# Patient Record
Sex: Female | Born: 1945 | Race: White | Hispanic: No | Marital: Married | Smoking: Former smoker
Health system: Southern US, Community
[De-identification: ages and names within clinical notes are randomized; demographics above are authoritative.]

## PROBLEM LIST (undated history)

## (undated) DIAGNOSIS — F32A Depression, unspecified: Secondary | ICD-10-CM

## (undated) DIAGNOSIS — G63 Polyneuropathy in diseases classified elsewhere: Secondary | ICD-10-CM

## (undated) DIAGNOSIS — F329 Major depressive disorder, single episode, unspecified: Secondary | ICD-10-CM

## (undated) DIAGNOSIS — E109 Type 1 diabetes mellitus without complications: Secondary | ICD-10-CM

## (undated) DIAGNOSIS — C801 Malignant (primary) neoplasm, unspecified: Secondary | ICD-10-CM

## (undated) DIAGNOSIS — J189 Pneumonia, unspecified organism: Secondary | ICD-10-CM

## (undated) DIAGNOSIS — I509 Heart failure, unspecified: Secondary | ICD-10-CM

## (undated) DIAGNOSIS — C569 Malignant neoplasm of unspecified ovary: Secondary | ICD-10-CM

## (undated) DIAGNOSIS — G629 Polyneuropathy, unspecified: Secondary | ICD-10-CM

## (undated) DIAGNOSIS — M359 Systemic involvement of connective tissue, unspecified: Secondary | ICD-10-CM

## (undated) DIAGNOSIS — K219 Gastro-esophageal reflux disease without esophagitis: Secondary | ICD-10-CM

## (undated) DIAGNOSIS — I2510 Atherosclerotic heart disease of native coronary artery without angina pectoris: Secondary | ICD-10-CM

## (undated) DIAGNOSIS — N135 Crossing vessel and stricture of ureter without hydronephrosis: Secondary | ICD-10-CM

## (undated) DIAGNOSIS — E785 Hyperlipidemia, unspecified: Secondary | ICD-10-CM

## (undated) DIAGNOSIS — Z8719 Personal history of other diseases of the digestive system: Secondary | ICD-10-CM

## (undated) DIAGNOSIS — R0602 Shortness of breath: Secondary | ICD-10-CM

## (undated) DIAGNOSIS — IMO0002 Reserved for concepts with insufficient information to code with codable children: Secondary | ICD-10-CM

## (undated) DIAGNOSIS — I10 Essential (primary) hypertension: Secondary | ICD-10-CM

## (undated) DIAGNOSIS — J3089 Other allergic rhinitis: Secondary | ICD-10-CM

## (undated) DIAGNOSIS — K222 Esophageal obstruction: Secondary | ICD-10-CM

## (undated) DIAGNOSIS — I214 Non-ST elevation (NSTEMI) myocardial infarction: Secondary | ICD-10-CM

## (undated) DIAGNOSIS — E876 Hypokalemia: Secondary | ICD-10-CM

## (undated) DIAGNOSIS — I639 Cerebral infarction, unspecified: Secondary | ICD-10-CM

## (undated) DIAGNOSIS — M1990 Unspecified osteoarthritis, unspecified site: Secondary | ICD-10-CM

## (undated) DIAGNOSIS — M069 Rheumatoid arthritis, unspecified: Secondary | ICD-10-CM

## (undated) DIAGNOSIS — D649 Anemia, unspecified: Secondary | ICD-10-CM

## (undated) DIAGNOSIS — D72819 Decreased white blood cell count, unspecified: Secondary | ICD-10-CM

## (undated) DIAGNOSIS — Z862 Personal history of diseases of the blood and blood-forming organs and certain disorders involving the immune mechanism: Secondary | ICD-10-CM

## (undated) HISTORY — PX: OSTOMY: SHX5997

## (undated) HISTORY — PX: OVARY SURGERY: SHX727

## (undated) HISTORY — DX: Esophageal obstruction: K22.2

## (undated) HISTORY — DX: Hypokalemia: E87.6

## (undated) HISTORY — DX: Essential (primary) hypertension: I10

## (undated) HISTORY — DX: Rheumatoid arthritis, unspecified: M06.9

## (undated) HISTORY — PX: EYE SURGERY: SHX253

## (undated) HISTORY — DX: Personal history of diseases of the blood and blood-forming organs and certain disorders involving the immune mechanism: Z86.2

## (undated) HISTORY — PX: ESOPHAGEAL DILATION: SHX303

## (undated) HISTORY — DX: Decreased white blood cell count, unspecified: D72.819

## (undated) HISTORY — DX: Reserved for concepts with insufficient information to code with codable children: IMO0002

## (undated) HISTORY — DX: Anemia, unspecified: D64.9

## (undated) HISTORY — DX: Non-ST elevation (NSTEMI) myocardial infarction: I21.4

## (undated) HISTORY — DX: Hyperlipidemia, unspecified: E78.5

## (undated) HISTORY — DX: Gastro-esophageal reflux disease without esophagitis: K21.9

---

## 1968-05-02 HISTORY — PX: TUBAL LIGATION: SHX77

## 2008-05-02 HISTORY — PX: CATARACT EXTRACTION W/ INTRAOCULAR LENS IMPLANT: SHX1309

## 2009-03-18 ENCOUNTER — Ambulatory Visit: Payer: Self-pay | Admitting: Internal Medicine

## 2009-05-11 ENCOUNTER — Ambulatory Visit: Payer: Self-pay | Admitting: Gastroenterology

## 2010-04-06 ENCOUNTER — Ambulatory Visit: Payer: Self-pay | Admitting: Internal Medicine

## 2010-04-09 ENCOUNTER — Ambulatory Visit: Payer: Self-pay | Admitting: Internal Medicine

## 2010-04-09 LAB — HM MAMMOGRAPHY: HM Mammogram: NORMAL

## 2010-04-14 ENCOUNTER — Ambulatory Visit: Payer: Self-pay | Admitting: Internal Medicine

## 2010-05-02 ENCOUNTER — Ambulatory Visit: Payer: Self-pay | Admitting: Internal Medicine

## 2010-05-02 DIAGNOSIS — D72819 Decreased white blood cell count, unspecified: Secondary | ICD-10-CM

## 2010-05-02 HISTORY — PX: ESOPHAGOGASTRODUODENOSCOPY: SHX1529

## 2010-05-02 HISTORY — DX: Decreased white blood cell count, unspecified: D72.819

## 2010-05-12 ENCOUNTER — Ambulatory Visit: Payer: Self-pay | Admitting: Gastroenterology

## 2010-05-15 LAB — PATHOLOGY REPORT

## 2010-06-02 ENCOUNTER — Ambulatory Visit: Payer: Self-pay | Admitting: Internal Medicine

## 2010-06-02 ENCOUNTER — Ambulatory Visit: Payer: Self-pay | Admitting: Rheumatology

## 2010-06-09 ENCOUNTER — Encounter: Payer: Self-pay | Admitting: Internal Medicine

## 2010-07-01 ENCOUNTER — Ambulatory Visit: Payer: Self-pay | Admitting: Internal Medicine

## 2010-07-13 ENCOUNTER — Ambulatory Visit: Payer: Self-pay | Admitting: Internal Medicine

## 2010-08-01 ENCOUNTER — Ambulatory Visit: Payer: Self-pay | Admitting: Internal Medicine

## 2010-09-10 ENCOUNTER — Ambulatory Visit: Payer: Self-pay | Admitting: Internal Medicine

## 2010-10-01 ENCOUNTER — Ambulatory Visit: Payer: Self-pay | Admitting: Internal Medicine

## 2010-11-15 ENCOUNTER — Ambulatory Visit: Payer: Self-pay | Admitting: Internal Medicine

## 2010-12-01 ENCOUNTER — Ambulatory Visit: Payer: Self-pay | Admitting: Internal Medicine

## 2011-01-10 ENCOUNTER — Encounter: Payer: Self-pay | Admitting: Internal Medicine

## 2011-01-10 ENCOUNTER — Ambulatory Visit (INDEPENDENT_AMBULATORY_CARE_PROVIDER_SITE_OTHER): Payer: Medicare Other | Admitting: Internal Medicine

## 2011-01-10 DIAGNOSIS — R197 Diarrhea, unspecified: Secondary | ICD-10-CM

## 2011-01-10 DIAGNOSIS — I10 Essential (primary) hypertension: Secondary | ICD-10-CM

## 2011-01-10 DIAGNOSIS — E119 Type 2 diabetes mellitus without complications: Secondary | ICD-10-CM

## 2011-01-10 DIAGNOSIS — M069 Rheumatoid arthritis, unspecified: Secondary | ICD-10-CM

## 2011-01-10 DIAGNOSIS — E785 Hyperlipidemia, unspecified: Secondary | ICD-10-CM

## 2011-01-10 LAB — COMPREHENSIVE METABOLIC PANEL
ALT: 17 U/L (ref 0–35)
Alkaline Phosphatase: 65 U/L (ref 39–117)
CO2: 30 mEq/L (ref 19–32)
Creatinine, Ser: 0.8 mg/dL (ref 0.4–1.2)
GFR: 76.03 mL/min (ref >60.00)
Glucose, Bld: 422 mg/dL — ABNORMAL HIGH (ref 70–99)
Sodium: 133 mEq/L — ABNORMAL LOW (ref 135–145)
Total Bilirubin: 0.5 mg/dL (ref 0.3–1.2)

## 2011-01-10 LAB — MICROALBUMIN / CREATININE URINE RATIO
Creatinine,U: 50 mg/dL
Microalb Creat Ratio: 0.4 mg/g (ref 0.0–30.0)
Microalb, Ur: 0.2 mg/dL (ref 0.0–1.9)

## 2011-01-10 LAB — HEMOGLOBIN A1C: Hgb A1c MFr Bld: 8.4 % — ABNORMAL HIGH (ref 4.6–6.5)

## 2011-01-10 LAB — GLUCOSE, POCT (MANUAL RESULT ENTRY): POC Glucose: 460

## 2011-01-10 NOTE — Patient Instructions (Signed)
Stool culture today. Return visit in 1 month.

## 2011-01-10 NOTE — Progress Notes (Signed)
Subjective:    Patient ID: [name], female    DOB: 28-May-1945, 65 y.o.   MRN: [id number]  HPI Ms. Rotundo is a 65 year old female with a history of diabetes who presents for followup. In regards to her diabetes she reports generally good control. Last night she had an issue where her insulin pump was inadvertently inserted into a blood vessel resulting in low blood sugars. She ultimately changed sites and this morning her blood sugar is elevated. She has bolused herself for a blood sugar over 400. Outside of this event she reports good control of her blood sugars with low sugars between 1 and 200. She occasionally has low blood sugars less than 60 for which she is symptomatic. She carries sugar with her for these episodes.  Ms. Laughter is concerned today with an approximate 2 month history of watery diarrhea. She has noted this diarrhea ever since starting Remicade for her rheumatoid arthritis. The diarrhea occurs immediately after eating. The stool is mostly consistent of food particles. She denies any blood in her stool. She occasionally has crampy abdominal pain. The diarrhea occurs nearly every day. She denies any weight loss secondary to this. She denies any fever, chills, or known sick contacts. She has not recently been on antibiotics.  Outpatient Encounter Prescriptions as of 01/10/2011  Medication Sig Dispense Refill  . amLODipine (NORVASC) 10 MG tablet Take 10 mg by mouth daily.       Marland Kitchen aspirin 81 MG tablet Take 81 mg by mouth daily.        . Calcium Carbonate Antacid (TUMS PO) Take 4 tablets by mouth daily.        . Dihydroxyaluminum Sod Carb (ROLAIDS PO) Take by mouth as needed.        . folic acid (FOLVITE) 1 MG tablet Take 1 mg by mouth daily.        . furosemide (LASIX) 40 MG tablet Take 40 mg by mouth 2 (two) times daily.       Marland Kitchen HUMALOG 100 UNIT/ML injection       . ibuprofen (ADVIL,MOTRIN) 800 MG tablet Take 800 mg by mouth daily as needed. 2 tablets daily as needed       .  InFLIXimab (REMICADE IV) Inject into the vein every 8 (eight) weeks.        Marland Kitchen losartan (COZAAR) 100 MG tablet Take 100 mg by mouth daily.       . methotrexate (RHEUMATREX) 2.5 MG tablet Take 2.5 mg by mouth once a week. 4 tablets once a week      . metoprolol (LOPRESSOR) 100 MG tablet Take 100 mg by mouth 2 (two) times daily.       . Multiple Vitamin (MULTIVITAMIN) tablet Take 1 tablet by mouth daily.        Marland Kitchen omeprazole (PRILOSEC) 40 MG capsule Take 40 mg by mouth daily.       . predniSONE (DELTASONE) 5 MG tablet Take 5 mg by mouth daily as needed.       . pyridOXINE (VITAMIN B-6) 100 MG tablet Take 100 mg by mouth daily.        . simvastatin (ZOCOR) 40 MG tablet Take 40 mg by mouth at bedtime.         Review of Systems  Constitutional: Negative for fever, chills, appetite change, fatigue and unexpected weight change.  HENT: Negative for ear pain, congestion, sore throat, trouble swallowing, neck pain, voice change and sinus pressure.   Eyes: Negative for visual  disturbance.  Respiratory: Negative for cough, shortness of breath, wheezing and stridor.   Cardiovascular: Negative for chest pain, palpitations and leg swelling.  Gastrointestinal: Positive for abdominal pain (occasional with diarrhea) and diarrhea. Negative for nausea, vomiting, constipation, blood in stool, abdominal distention and anal bleeding.  Genitourinary: Negative for dysuria and flank pain.  Musculoskeletal: Negative for myalgias, arthralgias and gait problem.  Skin: Negative for color change and rash.  Neurological: Negative for dizziness and headaches.  Hematological: Negative for adenopathy. Does not bruise/bleed easily.  Psychiatric/Behavioral: Negative for suicidal ideas, sleep disturbance and dysphoric mood. The patient is not nervous/anxious.    BP 136/74  Pulse 66  Temp(Src) 97.7 F (36.5 C) (Oral)  Resp 14  Ht 5\' 4"  (1.626 m)  Wt 147 lb 12 oz (67.019 kg)  BMI 25.36 kg/m2  SpO2 97%     Objective:    Physical Exam  Constitutional: She is oriented to person, place, and time. She appears well-developed and well-nourished. No distress.  HENT:  Head: Normocephalic and atraumatic.  Right Ear: External ear normal.  Left Ear: External ear normal.  Nose: Nose normal.  Mouth/Throat: Oropharynx is clear and moist. No oropharyngeal exudate.  Eyes: Conjunctivae are normal. Pupils are equal, round, and reactive to light. Right eye exhibits no discharge. Left eye exhibits no discharge. No scleral icterus.  Neck: Normal range of motion. Neck supple. No tracheal deviation present. No thyromegaly present.  Cardiovascular: Normal rate, regular rhythm, normal heart sounds and intact distal pulses.  Exam reveals no gallop and no friction rub.   No murmur heard. Pulmonary/Chest: Effort normal and breath sounds normal. No respiratory distress. She has no wheezes. She has no rales. She exhibits no tenderness.  Abdominal: Soft. Bowel sounds are normal. She exhibits no distension and no mass. There is no tenderness. There is no rebound and no guarding.  Musculoskeletal: Normal range of motion. She exhibits no edema and no tenderness.  Lymphadenopathy:    She has no cervical adenopathy.  Neurological: She is alert and oriented to person, place, and time. No cranial nerve deficit. She exhibits normal muscle tone. Coordination normal.  Skin: Skin is warm and dry. No rash noted. She is not diaphoretic. No erythema. No pallor.  Psychiatric: She has a normal mood and affect. Her behavior is normal. Judgment and thought content normal.          Assessment & Plan:  1. Diabetes -  has previously been under good control. Blood sugar of over 400 today is likely an outlier related to insulin pump becoming disconnected. She has bolused herself insulin and will continue to monitor her blood sugars carefully. She will call if blood sugars persistently greater than 250 or she has recurrent lows less than 60. We will repeat  hemoglobin A1c with labs today. Foot exam was normal today. We need to confirm that she has had her eye exam. She is up-to-date on Pneumovax. She will need a flu shot when available.  2. Diarrhea - diarrhea persistent for over 2 months. May be related to use of Remicade. Will, however, check stool for C. difficile infection and we'll send a stool culture. These results should be available tomorrow. If both are negative will discuss trial off of Remicade with her rheumatologist. Will check electrolytes and renal function today with labs. RTC in 1 month.

## 2011-01-15 LAB — STOOL CULTURE

## 2011-02-08 ENCOUNTER — Ambulatory Visit: Payer: Medicare Other | Admitting: Internal Medicine

## 2011-02-08 LAB — HM PAP SMEAR: HM Pap smear: NORMAL

## 2011-02-09 ENCOUNTER — Encounter: Payer: Self-pay | Admitting: Internal Medicine

## 2011-04-05 ENCOUNTER — Encounter: Payer: Self-pay | Admitting: Internal Medicine

## 2011-04-05 ENCOUNTER — Ambulatory Visit (INDEPENDENT_AMBULATORY_CARE_PROVIDER_SITE_OTHER): Payer: Medicare Other | Admitting: Internal Medicine

## 2011-04-05 DIAGNOSIS — K219 Gastro-esophageal reflux disease without esophagitis: Secondary | ICD-10-CM

## 2011-04-05 DIAGNOSIS — Z0000 Encounter for general adult medical examination without abnormal findings: Secondary | ICD-10-CM

## 2011-04-05 DIAGNOSIS — M810 Age-related osteoporosis without current pathological fracture: Secondary | ICD-10-CM | POA: Insufficient documentation

## 2011-04-05 DIAGNOSIS — E119 Type 2 diabetes mellitus without complications: Secondary | ICD-10-CM

## 2011-04-05 DIAGNOSIS — E785 Hyperlipidemia, unspecified: Secondary | ICD-10-CM

## 2011-04-05 DIAGNOSIS — I10 Essential (primary) hypertension: Secondary | ICD-10-CM

## 2011-04-05 LAB — COMPREHENSIVE METABOLIC PANEL
Alkaline Phosphatase: 74 U/L (ref 39–117)
CO2: 28 mEq/L (ref 19–32)
Creatinine, Ser: 0.8 mg/dL (ref 0.4–1.2)
GFR: 72.81 mL/min (ref >60.00)
Glucose, Bld: 282 mg/dL — ABNORMAL HIGH (ref 70–99)
Total Bilirubin: 0.5 mg/dL (ref 0.3–1.2)

## 2011-04-05 MED ORDER — DEXLANSOPRAZOLE 60 MG PO CPDR: 60.0000 mg | DELAYED_RELEASE_CAPSULE | Freq: Every day | ORAL | Status: DC

## 2011-04-05 NOTE — Progress Notes (Signed)
Subjective:    Patient ID: [name], female    DOB: 04/11/1946, 65 y.o.   MRN: [id number]  HPI 65YO female with Type 1 DM, hypertension, HL, osteoporosis, and GERD presents for follow up.  She reports she is generally feeling well. She has several concerns today.  First, she notes that her GERD symptoms of epigastric pain have been poorly controlled recently. She has not been able to take Omeprazole because it causes severe diarrhea.  She has been using Prevacid, however this does not control her symptoms. She would like to try another medication.  Second, she would like to potentially change cholesterol medications from Simvastatin to Lipitor. We had discussed this in the past because of med interactions with Simvastatin, however cost was prohibitive.  Third, she would like to start back on therapy for osteoporosis. She had severe abdominal pain with Boniva in the past, so would like to know other options.  In regards to her DM, she notes continued brittle sugars with frequent BG<80 and >300.  She manages this by modifying her insulin dosage through insulin pump.  She reports full compliance with all meds.  Did not bring record of BG or BP today.  Outpatient Encounter Prescriptions as of 04/05/2011  Medication Sig Dispense Refill  . amLODipine (NORVASC) 10 MG tablet Take 10 mg by mouth daily.       Marland Kitchen aspirin 81 MG tablet Take 81 mg by mouth daily.        . Calcium Carbonate Antacid (TUMS PO) Take 4 tablets by mouth daily.        . Dihydroxyaluminum Sod Carb (ROLAIDS PO) Take by mouth as needed.        . folic acid (FOLVITE) 1 MG tablet Take 1 mg by mouth daily.        . furosemide (LASIX) 40 MG tablet Take 40 mg by mouth 2 (two) times daily.       Marland Kitchen HUMALOG 100 UNIT/ML injection Insulin pump      . ibuprofen (ADVIL,MOTRIN) 800 MG tablet Take 800 mg by mouth daily as needed. 2 tablets daily as needed       . InFLIXimab (REMICADE IV) Inject into the vein every 8 (eight) weeks.        Marland Kitchen  losartan (COZAAR) 100 MG tablet Take 100 mg by mouth daily.       . methotrexate (RHEUMATREX) 2.5 MG tablet Take 10 mg by mouth once a week.       . metoprolol (LOPRESSOR) 100 MG tablet Take 100 mg by mouth 2 (two) times daily.       . Multiple Vitamin (MULTIVITAMIN) tablet Take 1 tablet by mouth daily.        Marland Kitchen omeprazole (PRILOSEC) 40 MG capsule Take 40 mg by mouth daily.       . predniSONE (DELTASONE) 5 MG tablet Take 5 mg by mouth daily as needed.       . pyridOXINE (VITAMIN B-6) 100 MG tablet Take 100 mg by mouth daily.        . simvastatin (ZOCOR) 40 MG tablet Take 40 mg by mouth at bedtime.       Marland Kitchen dexlansoprazole (DEXILANT) 60 MG capsule Take 1 capsule (60 mg total) by mouth daily.  30 capsule  6    Review of Systems  Constitutional: Negative for fever, chills, appetite change, fatigue and unexpected weight change.  HENT: Negative for ear pain, congestion, sore throat, trouble swallowing, neck pain, voice change and sinus pressure.  Eyes: Negative for visual disturbance.  Respiratory: Negative for cough, shortness of breath, wheezing and stridor.   Cardiovascular: Negative for chest pain, palpitations and leg swelling.  Gastrointestinal: Positive for abdominal pain (epigastric). Negative for nausea, vomiting, diarrhea, constipation, blood in stool, abdominal distention and anal bleeding.  Genitourinary: Negative for dysuria and flank pain.  Musculoskeletal: Negative for myalgias, arthralgias and gait problem.  Skin: Negative for color change and rash.  Neurological: Negative for dizziness and headaches.  Hematological: Negative for adenopathy. Does not bruise/bleed easily.  Psychiatric/Behavioral: Negative for suicidal ideas, sleep disturbance and dysphoric mood. The patient is not nervous/anxious.    BP 150/60  Pulse 67  Temp(Src) 97.9 F (36.6 C) (Oral)  Wt 157 lb (71.215 kg)  SpO2 98%     Objective:   Physical Exam  Constitutional: She is oriented to person, place, and  time. She appears well-developed and well-nourished. No distress.  HENT:  Head: Normocephalic and atraumatic.  Right Ear: External ear normal.  Left Ear: External ear normal.  Nose: Nose normal.  Mouth/Throat: Oropharynx is clear and moist. No oropharyngeal exudate.  Eyes: Conjunctivae are normal. Pupils are equal, round, and reactive to light. Right eye exhibits no discharge. Left eye exhibits no discharge. No scleral icterus.  Neck: Normal range of motion. Neck supple. No tracheal deviation present. No thyromegaly present.  Cardiovascular: Normal rate, regular rhythm, normal heart sounds and intact distal pulses.  Exam reveals no gallop and no friction rub.   No murmur heard. Pulmonary/Chest: Effort normal and breath sounds normal. No respiratory distress. She has no wheezes. She has no rales. She exhibits no tenderness.  Abdominal: Soft. Bowel sounds are normal. She exhibits no distension and no mass. There is no tenderness. There is no rebound and no guarding.  Musculoskeletal: Normal range of motion. She exhibits no edema and no tenderness.  Lymphadenopathy:    She has no cervical adenopathy.  Neurological: She is alert and oriented to person, place, and time. No cranial nerve deficit. She exhibits normal muscle tone. Coordination normal.  Skin: Skin is warm and dry. No rash noted. She is not diaphoretic. No erythema. No pallor.  Psychiatric: She has a normal mood and affect. Her behavior is normal. Judgment and thought content normal.          Assessment & Plan:  1. Diabetes mellitus - Fairly stable control. HgbA1c typically near 8%, but pt with very brittle sugars. Will continue current settings on insulin pump.  Pt will follow up with endocrinology and here in 3 months.  2. GERD - Allergy to omeprazole with severe diarrhea. Will try changing to Dexilant to see if any improvement.    3. Osteoporosis - Discussed potential options for treatment, including IV Reclast and prolia.   She will discuss with rheumatologist and then decide about which treatment to pursue.  4. Hyperlipidemia - Planned to change back to Lipitor when it was available generic. Pt will look into insurance coverage for this, and if reasonable cost, then will change from Simvastatin to Lipitor.  5. Hypertension - BP slightly elevated today, however well controlled at home. Will continue Losartan, Metoprolol, and Amlodipine. Follow up 3 months.

## 2011-04-07 ENCOUNTER — Telehealth: Payer: Self-pay | Admitting: *Deleted

## 2011-04-07 NOTE — Telephone Encounter (Signed)
Pharm left VM - dexilant is too expensive, pharm recommends trying protonix.

## 2011-04-07 NOTE — Telephone Encounter (Signed)
OK. Protonix 40mg  po daily, disp 30 3 refill

## 2011-04-08 MED ORDER — PANTOPRAZOLE SODIUM 40 MG PO TBEC: 40.0000 mg | DELAYED_RELEASE_TABLET | Freq: Every day | ORAL | Status: DC

## 2011-04-08 NOTE — Telephone Encounter (Signed)
Done

## 2011-05-12 ENCOUNTER — Other Ambulatory Visit: Payer: Self-pay | Admitting: *Deleted

## 2011-05-12 MED ORDER — INSULIN LISPRO 100 UNIT/ML ~~LOC~~ SOLN: SUBCUTANEOUS | Status: DC

## 2011-05-13 ENCOUNTER — Other Ambulatory Visit: Payer: Self-pay | Admitting: *Deleted

## 2011-05-13 MED ORDER — INSULIN LISPRO 100 UNIT/ML ~~LOC~~ SOLN: SUBCUTANEOUS | Status: DC

## 2011-05-25 ENCOUNTER — Other Ambulatory Visit: Payer: Self-pay | Admitting: *Deleted

## 2011-05-25 MED ORDER — SIMVASTATIN 40 MG PO TABS: 40.0000 mg | ORAL_TABLET | Freq: Every day | ORAL | Status: DC

## 2011-06-17 ENCOUNTER — Ambulatory Visit: Payer: Self-pay | Admitting: Internal Medicine

## 2011-06-27 LAB — HM MAMMOGRAPHY

## 2011-06-29 ENCOUNTER — Encounter: Payer: Self-pay | Admitting: Internal Medicine

## 2011-06-29 ENCOUNTER — Ambulatory Visit: Payer: Self-pay | Admitting: Gastroenterology

## 2011-06-29 LAB — KOH PREP

## 2011-07-06 ENCOUNTER — Encounter: Payer: Self-pay | Admitting: Internal Medicine

## 2011-07-06 ENCOUNTER — Ambulatory Visit (INDEPENDENT_AMBULATORY_CARE_PROVIDER_SITE_OTHER): Payer: Medicare Other | Admitting: Internal Medicine

## 2011-07-06 DIAGNOSIS — E785 Hyperlipidemia, unspecified: Secondary | ICD-10-CM

## 2011-07-06 DIAGNOSIS — H9190 Unspecified hearing loss, unspecified ear: Secondary | ICD-10-CM | POA: Insufficient documentation

## 2011-07-06 DIAGNOSIS — B3781 Candidal esophagitis: Secondary | ICD-10-CM | POA: Insufficient documentation

## 2011-07-06 DIAGNOSIS — K219 Gastro-esophageal reflux disease without esophagitis: Secondary | ICD-10-CM | POA: Insufficient documentation

## 2011-07-06 DIAGNOSIS — Z0000 Encounter for general adult medical examination without abnormal findings: Secondary | ICD-10-CM

## 2011-07-06 DIAGNOSIS — L259 Unspecified contact dermatitis, unspecified cause: Secondary | ICD-10-CM

## 2011-07-06 DIAGNOSIS — I10 Essential (primary) hypertension: Secondary | ICD-10-CM

## 2011-07-06 DIAGNOSIS — E109 Type 1 diabetes mellitus without complications: Secondary | ICD-10-CM

## 2011-07-06 DIAGNOSIS — L309 Dermatitis, unspecified: Secondary | ICD-10-CM

## 2011-07-06 DIAGNOSIS — M069 Rheumatoid arthritis, unspecified: Secondary | ICD-10-CM

## 2011-07-06 LAB — COMPREHENSIVE METABOLIC PANEL
AST: 24 U/L (ref 0–37)
Albumin: 3.9 g/dL (ref 3.5–5.2)
Alkaline Phosphatase: 49 U/L (ref 39–117)
BUN: 12 mg/dL (ref 6–23)
Potassium: 4.3 mEq/L (ref 3.5–5.1)
Sodium: 133 mEq/L — ABNORMAL LOW (ref 135–145)
Total Bilirubin: 0.3 mg/dL (ref 0.3–1.2)
Total Protein: 7.3 g/dL (ref 6.0–8.3)

## 2011-07-06 LAB — CBC WITH DIFFERENTIAL/PLATELET
Basophils Absolute: 0.1 K/uL (ref 0.0–0.1)
Basophils Relative: 1.2 % (ref 0.0–3.0)
Eosinophils Absolute: 0.1 K/uL (ref 0.0–0.7)
Eosinophils Relative: 1.7 % (ref 0.0–5.0)
HCT: 36.3 % (ref 36.0–46.0)
Hemoglobin: 12 g/dL (ref 12.0–15.0)
Lymphocytes Relative: 26.2 % (ref 12.0–46.0)
Lymphs Abs: 1.2 K/uL (ref 0.7–4.0)
MCHC: 33.2 g/dL (ref 30.0–36.0)
MCV: 91.1 fl (ref 78.0–100.0)
Monocytes Absolute: 0.5 K/uL (ref 0.1–1.0)
Monocytes Relative: 10.7 % (ref 3.0–12.0)
Neutro Abs: 2.9 K/uL (ref 1.4–7.7)
Neutrophils Relative %: 60.2 % (ref 43.0–77.0)
Platelets: 200 K/uL (ref 150.0–400.0)
RBC: 3.99 Mil/uL (ref 3.87–5.11)
RDW: 14.9 % — ABNORMAL HIGH (ref 11.5–14.6)
WBC: 4.8 K/uL (ref 4.5–10.5)

## 2011-07-06 LAB — LIPID PANEL
Cholesterol: 223 mg/dL — ABNORMAL HIGH (ref 0–200)
HDL: 79.6 mg/dL
Total CHOL/HDL Ratio: 3
Triglycerides: 97 mg/dL (ref 0.0–149.0)
VLDL: 19.4 mg/dL (ref 0.0–40.0)

## 2011-07-06 LAB — LDL CHOLESTEROL, DIRECT: Direct LDL: 120.6 mg/dL

## 2011-07-06 MED ORDER — ATORVASTATIN CALCIUM 20 MG PO TABS: 20.0000 mg | ORAL_TABLET | Freq: Every day | ORAL | Status: DC

## 2011-07-06 MED ORDER — TRIAMCINOLONE 0.1 % CREAM:EUCERIN CREAM 1:1: 1.0000 "application " | TOPICAL_CREAM | Freq: Two times a day (BID) | CUTANEOUS | Status: DC | PRN

## 2011-07-06 MED ORDER — PANTOPRAZOLE SODIUM 40 MG PO TBEC: 40.0000 mg | DELAYED_RELEASE_TABLET | Freq: Every day | ORAL | Status: DC

## 2011-07-06 NOTE — Assessment & Plan Note (Signed)
General exam including breast exam was normal today. Pap is up-to-date it was performed in 2012. We'll plan to repeat Pap in 2014. Patient is up-to-date on vaccinations, mammogram, and colonoscopy. Will set up audiology testing given her history of hearing loss.

## 2011-07-06 NOTE — Assessment & Plan Note (Signed)
Goal LDL less than 70. Will check lipids and LFTs with labs today. Continue Lipitor. Followup in 3 months.

## 2011-07-06 NOTE — Progress Notes (Signed)
Subjective:    Patient ID: [name], female    DOB: 07-13-1945, 66 y.o.   MRN: [id number]  HPI The patient is here for annual Medicare wellness examination and management of other chronic and acute problems.   The risk factors are reflected in the social history.  The roster of all physicians providing medical care to patient - is listed in the Snapshot section of the chart.  Activities of daily living:  The patient is 100% independent in all ADLs: dressing, toileting, feeding as well as independent mobility  Home safety : The patient has smoke detectors and carbon monoxide detectors in the home. They wear seatbelts. Firearms at home, locked in safe. There is no violence in the home.   There is no risks for hepatitis, STDs or HIV. There is no  history of blood transfusion. They have no travel history to infectious disease endemic areas of the world.  The patient has seen their dentist in the last six month. They have seen their eye doctor in the last year. They admit to any hearing difficulty and have not had audiologic testing in the last year.  They do not  have excessive sun exposure. Discussed the need for sun protection: hats, long sleeves and use of sunscreen if there is significant sun exposure.   Diet: the importance of a healthy diet is discussed. They do have a healthy diet.  The patient does not has a regular exercise program. The benefits of regular aerobic exercise were discussed.  Depression screen: there are no signs or vegative symptoms of depression- irritability, change in appetite, anhedonia, sadness/tearfullness.  Cognitive assessment: the patient manages all their financial and personal affairs and is actively engaged. They could relate day,date,year and events; recalled 3/3 objects at 3 minutes; performed clock-face test normally.  The following portions of the patient's history were reviewed and updated as appropriate: allergies, current medications, past family  history, past medical history,  past surgical history, past social history  and problem list.  Vision, hearing, body mass index were assessed and reviewed.   During the course of the visit the patient was educated and counseled about appropriate screening and preventive services including : fall prevention , diabetes screening, nutrition counseling, colorectal cancer screening, and recommended immunizations.    Patient is also concerned today about several month history of decreased hearing. She notes that when she is in a crowd she is unable to hear the person that she is speaking to. She is interested in setting up hearing testing.  She is also concerned today about a rash on her anterior lower legs. She notes this was first present several weeks ago. It is described as red and itchy. She denies use of any new lotions or soaps. She has not been applying anything to the rash.  Patient also notes that she recently underwent endoscopy because of persistent GERD symptoms. She was noted to have candidal esophagitis. She was started on Diflucan and reports improvement in her symptoms. She also notes that she had dilation of a GE junction stricture. She notes that this is improved her ability to swallow and keep down food and has also helped with pain.  Outpatient Encounter Prescriptions as of 07/06/2011  Medication Sig Dispense Refill  . amLODipine (NORVASC) 10 MG tablet Take 10 mg by mouth daily.       Marland Kitchen aspirin 81 MG tablet Take 81 mg by mouth daily.        . Calcium Carbonate Antacid (TUMS PO) Take  4 tablets by mouth daily.        . Cholecalciferol (VITAMIN D) 2000 UNITS CAPS Take 1 capsule by mouth daily.      . Dihydroxyaluminum Sod Carb (ROLAIDS PO) Take by mouth as needed.        . folic acid (FOLVITE) 1 MG tablet Take 1 mg by mouth daily.        . furosemide (LASIX) 40 MG tablet Take 40 mg by mouth 2 (two) times daily.       Marland Kitchen ibuprofen (ADVIL,MOTRIN) 800 MG tablet Take 800 mg by mouth daily  as needed. 2 tablets daily as needed       . InFLIXimab (REMICADE IV) Inject into the vein every 7 (seven) weeks.       . insulin lispro (HUMALOG) 100 UNIT/ML injection As directed via Insulin pump  30 mL  6  . Krill Oil 300 MG CAPS Take by mouth daily.      Marland Kitchen losartan (COZAAR) 100 MG tablet Take 100 mg by mouth daily.       . methotrexate (RHEUMATREX) 2.5 MG tablet Take 10 mg by mouth once a week.       . metoprolol (LOPRESSOR) 100 MG tablet Take 100 mg by mouth 2 (two) times daily.       . Multiple Vitamin (MULTIVITAMIN) tablet Take 1 tablet by mouth daily.        . pantoprazole (PROTONIX) 40 MG tablet Take 1 tablet (40 mg total) by mouth daily.  90 tablet  3  . predniSONE (DELTASONE) 5 MG tablet Take 5 mg by mouth daily as needed.       . pyridOXINE (VITAMIN B-6) 100 MG tablet Take 100 mg by mouth daily.        . zoledronic acid (RECLAST) 5 MG/100ML SOLN Inject 5 mg into the vein once.      Marland Kitchen DISCONTD: pantoprazole (PROTONIX) 40 MG tablet Take 1 tablet (40 mg total) by mouth daily.  30 tablet  3  . DISCONTD: simvastatin (ZOCOR) 40 MG tablet Take 1 tablet (40 mg total) by mouth at bedtime.  90 tablet  1  . atorvastatin (LIPITOR) 20 MG tablet Take 1 tablet (20 mg total) by mouth daily.  90 tablet  3  . fluconazole (DIFLUCAN) 100 MG tablet       . Triamcinolone Acetonide (TRIAMCINOLONE 0.1 % CREAM : EUCERIN) CREA Apply 1 application topically 2 (two) times daily as needed.  1 each  3  . DISCONTD: omeprazole (PRILOSEC) 40 MG capsule Take 40 mg by mouth daily.         Review of Systems  Constitutional: Negative for fever, chills, appetite change, fatigue and unexpected weight change.  HENT: Positive for hearing loss. Negative for ear pain, congestion, sore throat, trouble swallowing, neck pain, voice change and sinus pressure.   Eyes: Negative for visual disturbance.  Respiratory: Negative for cough, shortness of breath, wheezing and stridor.   Cardiovascular: Negative for chest pain,  palpitations and leg swelling.  Gastrointestinal: Negative for nausea, vomiting, abdominal pain, diarrhea, constipation, blood in stool, abdominal distention and anal bleeding.  Genitourinary: Negative for dysuria and flank pain.  Musculoskeletal: Negative for myalgias, arthralgias and gait problem.  Skin: Positive for rash. Negative for color change.  Neurological: Negative for dizziness and headaches.  Hematological: Negative for adenopathy. Does not bruise/bleed easily.  Psychiatric/Behavioral: Negative for suicidal ideas, sleep disturbance and dysphoric mood. The patient is not nervous/anxious.    BP 142/72  Pulse 72  Temp(Src) 98.3 F (36.8 C) (Oral)  Ht 5\' 3"  (1.6 m)  Wt 154 lb (69.854 kg)  BMI 27.28 kg/m2  SpO2 95%     Objective:   Physical Exam  Constitutional: She is oriented to person, place, and time. She appears well-developed and well-nourished. No distress.  HENT:  Head: Normocephalic and atraumatic.  Right Ear: External ear normal.  Left Ear: External ear normal.  Nose: Nose normal.  Mouth/Throat: Oropharynx is clear and moist. No oropharyngeal exudate.  Eyes: Conjunctivae are normal. Pupils are equal, round, and reactive to light. Right eye exhibits no discharge. Left eye exhibits no discharge. No scleral icterus.  Neck: Normal range of motion. Neck supple. No tracheal deviation present. No thyromegaly present.  Cardiovascular: Normal rate, regular rhythm, normal heart sounds and intact distal pulses.  Exam reveals no gallop and no friction rub.   No murmur heard. Pulmonary/Chest: Effort normal and breath sounds normal. No respiratory distress. She has no wheezes. She has no rales. She exhibits no tenderness. Right breast exhibits no inverted nipple, no mass, no nipple discharge, no skin change and no tenderness. Left breast exhibits no inverted nipple, no mass, no nipple discharge, no skin change and no tenderness. Breasts are symmetrical.  Abdominal: Soft. Bowel  sounds are normal. She exhibits no distension and no mass. There is no tenderness. There is no rebound and no guarding.  Musculoskeletal: Normal range of motion. She exhibits no edema and no tenderness.  Lymphadenopathy:    She has no cervical adenopathy.  Neurological: She is alert and oriented to person, place, and time. No cranial nerve deficit. She exhibits normal muscle tone. Coordination normal.  Skin: Skin is warm and dry. Rash (anterior lower legs bilaterally) noted. Rash is maculopapular. She is not diaphoretic. No erythema. No pallor.  Psychiatric: She has a normal mood and affect. Her behavior is normal. Judgment and thought content normal.          Assessment & Plan:

## 2011-07-06 NOTE — Assessment & Plan Note (Signed)
Chronic, esp in crowded room. Will set up hearing testing with [facility].

## 2011-07-06 NOTE — Assessment & Plan Note (Signed)
Found on recent EGD. Symptoms improved after dilation with endoscopy 06/2011.  Followed by Dr. Bluford Kaufmann at [facility].  Will continue PPI.

## 2011-07-06 NOTE — Assessment & Plan Note (Signed)
Fairly well controlled with use of insulin pump.  Some labile sugars and low sugars which pt is able to recognize and treat.

## 2011-07-06 NOTE — Assessment & Plan Note (Signed)
Improving with treatment with Diflucan. Will continue to monitor. Followup 3 months.

## 2011-07-06 NOTE — Assessment & Plan Note (Signed)
Recent worsening secondary to candidal esophagitis and GE junction stricture. Patient reports symptoms are improved after starting antifungal medication and having stricture dilated. Will continue to monitor. Followup in 3 months.

## 2011-07-06 NOTE — Assessment & Plan Note (Signed)
Bilateral lower legs. Likely secondary to dry weather. Will add eucerin with triamcinolone prn. Pt will call if no improvement over next 2 weeks.

## 2011-07-06 NOTE — Assessment & Plan Note (Signed)
Blood pressure is well-controlled today. Will check renal function with labs. Followup in 3 months.

## 2011-07-06 NOTE — Assessment & Plan Note (Signed)
Symptoms well controlled on methotrexate and prednisone. Will continue to follow with rheumatology. Followup here in 3 months.

## 2011-07-12 ENCOUNTER — Encounter: Payer: Self-pay | Admitting: Internal Medicine

## 2011-08-02 ENCOUNTER — Other Ambulatory Visit: Payer: Self-pay | Admitting: *Deleted

## 2011-08-02 MED ORDER — FOLIC ACID 1 MG PO TABS: 1.0000 mg | ORAL_TABLET | Freq: Every day | ORAL | Status: DC

## 2011-08-02 MED ORDER — METOPROLOL TARTRATE 100 MG PO TABS: 100.0000 mg | ORAL_TABLET | Freq: Two times a day (BID) | ORAL | Status: DC

## 2011-08-02 MED ORDER — AMLODIPINE BESYLATE 10 MG PO TABS: 10.0000 mg | ORAL_TABLET | Freq: Every day | ORAL | Status: DC

## 2011-08-02 MED ORDER — LOSARTAN POTASSIUM 100 MG PO TABS: 100.0000 mg | ORAL_TABLET | Freq: Every day | ORAL | Status: DC

## 2011-08-22 ENCOUNTER — Encounter: Payer: Self-pay | Admitting: Internal Medicine

## 2011-08-22 ENCOUNTER — Other Ambulatory Visit: Payer: Self-pay | Admitting: *Deleted

## 2011-08-22 DIAGNOSIS — E785 Hyperlipidemia, unspecified: Secondary | ICD-10-CM

## 2011-08-22 MED ORDER — ATORVASTATIN CALCIUM 20 MG PO TABS: 20.0000 mg | ORAL_TABLET | Freq: Every day | ORAL | Status: DC

## 2011-08-22 MED ORDER — METOPROLOL TARTRATE 100 MG PO TABS: 100.0000 mg | ORAL_TABLET | Freq: Two times a day (BID) | ORAL | Status: DC

## 2011-09-07 ENCOUNTER — Other Ambulatory Visit: Payer: Self-pay | Admitting: Internal Medicine

## 2011-09-08 MED ORDER — IBUPROFEN 800 MG PO TABS: 800.0000 mg | ORAL_TABLET | Freq: Three times a day (TID) | ORAL | Status: DC | PRN

## 2011-09-14 ENCOUNTER — Other Ambulatory Visit: Payer: Self-pay | Admitting: *Deleted

## 2011-09-14 MED ORDER — FUROSEMIDE 40 MG PO TABS: 40.0000 mg | ORAL_TABLET | Freq: Two times a day (BID) | ORAL | Status: DC

## 2011-09-14 NOTE — Telephone Encounter (Signed)
Done

## 2011-09-27 ENCOUNTER — Encounter: Payer: Self-pay | Admitting: Internal Medicine

## 2011-09-30 ENCOUNTER — Ambulatory Visit (INDEPENDENT_AMBULATORY_CARE_PROVIDER_SITE_OTHER): Payer: Medicare Other | Admitting: Internal Medicine

## 2011-09-30 ENCOUNTER — Encounter: Payer: Self-pay | Admitting: Internal Medicine

## 2011-09-30 VITALS — BP 149/73 | HR 67 | Temp 98.5°F | Resp 16 | Wt 157.8 lb

## 2011-09-30 DIAGNOSIS — J40 Bronchitis, not specified as acute or chronic: Secondary | ICD-10-CM

## 2011-09-30 MED ORDER — PREDNISONE 20 MG PO TABS: 20.0000 mg | ORAL_TABLET | Freq: Every day | ORAL | Status: DC

## 2011-09-30 MED ORDER — LEVOFLOXACIN 750 MG PO TABS: 750.0000 mg | ORAL_TABLET | Freq: Every day | ORAL | Status: DC

## 2011-09-30 NOTE — Progress Notes (Signed)
Subjective:    Patient ID: [name], female    DOB: 1945-12-09, 66 y.o.   MRN: [id number]  HPI 66 year old female with history of diabetes, rheumatoid arthritis, hyperlipidemia, hypertension presents for acute visit complaining of a two-week history of sinus congestion, headache, facial pain. Her symptoms first began with general malaise, subjective fever, and nonproductive cough. Over the last 2 weeks, she has developed worsening bilateral maxillary sinus pressure and headache pain. She has occasional purulent drainage from her sinuses. She also continues to have occasional cough. This is generally nonproductive. She denies any recurrent fever or chills. She denies shortness of breath. She continues to have some fatigue. She has used over-the-counter cough and cold preparations with no improvement.  Outpatient Encounter Prescriptions as of 09/30/2011  Medication Sig Dispense Refill  . amLODipine (NORVASC) 10 MG tablet Take 1 tablet (10 mg total) by mouth daily.  90 tablet  1  . aspirin 81 MG tablet Take 81 mg by mouth daily.        Marland Kitchen atorvastatin (LIPITOR) 20 MG tablet Take 1 tablet (20 mg total) by mouth daily.  90 tablet  2  . Calcium Carbonate Antacid (TUMS PO) Take 4 tablets by mouth daily.        . Cholecalciferol (VITAMIN D) 2000 UNITS CAPS Take 1 capsule by mouth daily.      . Dihydroxyaluminum Sod Carb (ROLAIDS PO) Take by mouth as needed.        . folic acid (FOLVITE) 1 MG tablet Take 1 tablet (1 mg total) by mouth daily.  90 tablet  1  . furosemide (LASIX) 40 MG tablet Take 1 tablet (40 mg total) by mouth 2 (two) times daily.  60 tablet  5  . hydroxychloroquine (PLAQUENIL) 200 MG tablet Take 200 mg by mouth daily.      Marland Kitchen ibuprofen (ADVIL,MOTRIN) 800 MG tablet Take 1 tablet (800 mg total) by mouth 3 (three) times daily as needed.  270 tablet  1  . insulin lispro (HUMALOG) 100 UNIT/ML injection As directed via Insulin pump  30 mL  6  . Krill Oil 300 MG CAPS Take by mouth daily.        Marland Kitchen losartan (COZAAR) 100 MG tablet Take 1 tablet (100 mg total) by mouth daily.  90 tablet  1  . methotrexate (RHEUMATREX) 2.5 MG tablet Take 6 mg by mouth once a week.       . metoprolol (LOPRESSOR) 100 MG tablet Take 1 tablet (100 mg total) by mouth 2 (two) times daily.  180 tablet  2  . Multiple Vitamin (MULTIVITAMIN) tablet Take 1 tablet by mouth daily.        . pantoprazole (PROTONIX) 40 MG tablet Take 1 tablet (40 mg total) by mouth daily.  90 tablet  3  . predniSONE (DELTASONE) 5 MG tablet Take 5 mg by mouth daily as needed.       . pyridOXINE (VITAMIN B-6) 100 MG tablet Take 100 mg by mouth daily.        . Triamcinolone Acetonide (TRIAMCINOLONE 0.1 % CREAM : EUCERIN) CREA Apply 1 application topically 2 (two) times daily as needed.  1 each  3  . zoledronic acid (RECLAST) 5 MG/100ML SOLN Inject 5 mg into the vein once.      Marland Kitchen DISCONTD: fluconazole (DIFLUCAN) 100 MG tablet       . InFLIXimab (REMICADE IV) Inject into the vein every 7 (seven) weeks.       Marland Kitchen levofloxacin (LEVAQUIN) 750 MG  tablet Take 1 tablet (750 mg total) by mouth daily.  7 tablet  0  . predniSONE (DELTASONE) 20 MG tablet Take 1 tablet (20 mg total) by mouth daily.  5 tablet  0    Review of Systems  Constitutional: Positive for fever and fatigue. Negative for chills and unexpected weight change.  HENT: Positive for ear pain, congestion, facial swelling, rhinorrhea, postnasal drip and sinus pressure. Negative for hearing loss, nosebleeds, sore throat, sneezing, mouth sores, trouble swallowing, neck pain, neck stiffness, voice change, tinnitus and ear discharge.   Eyes: Negative for pain, discharge, redness and visual disturbance.  Respiratory: Positive for cough and wheezing. Negative for chest tightness, shortness of breath and stridor.   Cardiovascular: Negative for chest pain, palpitations and leg swelling.  Musculoskeletal: Negative for myalgias and arthralgias.  Skin: Negative for color change and rash.   Neurological: Negative for dizziness, weakness, light-headedness and headaches.  Hematological: Negative for adenopathy.       Objective:   Physical Exam  Constitutional: She is oriented to person, place, and time. She appears well-developed and well-nourished. No distress.  HENT:  Head: Normocephalic. Head is with raccoon's eyes.  Right Ear: Tympanic membrane, external ear and ear canal normal.  Left Ear: Tympanic membrane, external ear and ear canal normal.  Nose: Mucosal edema present. Right sinus exhibits maxillary sinus tenderness. Left sinus exhibits maxillary sinus tenderness.  Mouth/Throat: Oropharynx is clear and moist. No oropharyngeal exudate or posterior oropharyngeal erythema.  Eyes: Conjunctivae are normal. Pupils are equal, round, and reactive to light. Right eye exhibits no discharge. Left eye exhibits no discharge. No scleral icterus.  Neck: Normal range of motion. Neck supple. No tracheal deviation present. No thyromegaly present.  Cardiovascular: Normal rate, regular rhythm, normal heart sounds and intact distal pulses.  Exam reveals no gallop and no friction rub.   No murmur heard. Pulmonary/Chest: Effort normal. No accessory muscle usage. Not tachypneic. No respiratory distress. She has decreased breath sounds. She has wheezes. She has no rales. She exhibits no tenderness.  Musculoskeletal: Normal range of motion. She exhibits no edema and no tenderness.  Lymphadenopathy:    She has no cervical adenopathy.  Neurological: She is alert and oriented to person, place, and time. No cranial nerve deficit. She exhibits normal muscle tone. Coordination normal.  Skin: Skin is warm and dry. No rash noted. She is not diaphoretic. No erythema. No pallor.  Psychiatric: She has a normal mood and affect. Her behavior is normal. Judgment and thought content normal.          Assessment & Plan:

## 2011-09-30 NOTE — Assessment & Plan Note (Signed)
Symptoms and exam are most consistent with maxillary sinusitis and bronchitis. Patient is at increased risk for aggressive bacterial infection given chronic immune suppression. Will treat with Levaquin and prednisone. Patient will use Robitussin as needed for cough. She will call or e-mail with update on Monday. If no improvement, would favor getting chest x-ray.

## 2011-09-30 NOTE — Patient Instructions (Signed)
Start Levaquin and Prednisone today.  Use Robitussin as needed for cough.  Call Monday if symptoms not improving, and we will order chest xray.

## 2011-10-03 ENCOUNTER — Encounter: Payer: Self-pay | Admitting: Internal Medicine

## 2011-10-10 ENCOUNTER — Ambulatory Visit (INDEPENDENT_AMBULATORY_CARE_PROVIDER_SITE_OTHER): Payer: Medicare Other | Admitting: Internal Medicine

## 2011-10-10 ENCOUNTER — Encounter: Payer: Self-pay | Admitting: Internal Medicine

## 2011-10-10 VITALS — BP 120/60 | HR 64 | Temp 98.6°F | Wt 155.5 lb

## 2011-10-10 DIAGNOSIS — J40 Bronchitis, not specified as acute or chronic: Secondary | ICD-10-CM

## 2011-10-10 DIAGNOSIS — K219 Gastro-esophageal reflux disease without esophagitis: Secondary | ICD-10-CM

## 2011-10-10 DIAGNOSIS — E109 Type 1 diabetes mellitus without complications: Secondary | ICD-10-CM

## 2011-10-10 DIAGNOSIS — K222 Esophageal obstruction: Secondary | ICD-10-CM

## 2011-10-10 NOTE — Progress Notes (Signed)
Subjective:    Patient ID: [name], female    DOB: 01-10-1946, 66 y.o.   MRN: [id number]  HPI  66 year old female with history of diabetes, rheumatoid arthritis and recent episode of bronchitis presents for followup. She reports significant improvement in her cough and shortness of breath with the use of prednisone and antibiotics. She still has an occasional nonproductive cough. She denies any fever, chills, chest pain. She denies shortness of breath. She notes that her blood sugars were elevated with the use of prednisone. They are returning to normal.  Outpatient Encounter Prescriptions as of 10/10/2011  Medication Sig Dispense Refill  . amLODipine (NORVASC) 10 MG tablet Take 1 tablet (10 mg total) by mouth daily.  90 tablet  1  . aspirin 81 MG tablet Take 81 mg by mouth daily.        Marland Kitchen atorvastatin (LIPITOR) 20 MG tablet Take 1 tablet (20 mg total) by mouth daily.  90 tablet  2  . Calcium Carbonate Antacid (TUMS PO) Take 4 tablets by mouth daily.        . Cholecalciferol (VITAMIN D) 2000 UNITS CAPS Take 1 capsule by mouth daily.      . Dihydroxyaluminum Sod Carb (ROLAIDS PO) Take by mouth as needed.        . folic acid (FOLVITE) 1 MG tablet Take 1 tablet (1 mg total) by mouth daily.  90 tablet  1  . furosemide (LASIX) 40 MG tablet Take 1 tablet (40 mg total) by mouth 2 (two) times daily.  60 tablet  5  . hydroxychloroquine (PLAQUENIL) 200 MG tablet Take 200 mg by mouth daily.      Marland Kitchen ibuprofen (ADVIL,MOTRIN) 800 MG tablet Take 1 tablet (800 mg total) by mouth 3 (three) times daily as needed.  270 tablet  1  . InFLIXimab (REMICADE IV) Inject into the vein every 7 (seven) weeks.       . insulin lispro (HUMALOG) 100 UNIT/ML injection As directed via Insulin pump  30 mL  6  . Krill Oil 300 MG CAPS Take by mouth daily.      Marland Kitchen losartan (COZAAR) 100 MG tablet Take 1 tablet (100 mg total) by mouth daily.  90 tablet  1  . methotrexate (RHEUMATREX) 2.5 MG tablet Take 6 mg by mouth once a week.        . metoprolol (LOPRESSOR) 100 MG tablet Take 1 tablet (100 mg total) by mouth 2 (two) times daily.  180 tablet  2  . Multiple Vitamin (MULTIVITAMIN) tablet Take 1 tablet by mouth daily.        . predniSONE (DELTASONE) 5 MG tablet Take 5 mg by mouth daily as needed.       . pyridOXINE (VITAMIN B-6) 100 MG tablet Take 100 mg by mouth daily.        . Triamcinolone Acetonide (TRIAMCINOLONE 0.1 % CREAM : EUCERIN) CREA Apply 1 application topically 2 (two) times daily as needed.  1 each  3  . zoledronic acid (RECLAST) 5 MG/100ML SOLN Inject 5 mg into the vein once.      . pantoprazole (PROTONIX) 40 MG tablet Take 1 tablet (40 mg total) by mouth daily.  90 tablet  3   BP 120/60  Pulse 64  Temp 98.6 F (37 C)  Wt 155 lb 8 oz (70.534 kg)  SpO2 98%  Review of Systems  Constitutional: Positive for fatigue. Negative for fever, chills, appetite change and unexpected weight change.  HENT: Negative for ear pain,  congestion, sore throat, trouble swallowing, neck pain, voice change and sinus pressure.   Eyes: Negative for visual disturbance.  Respiratory: Positive for cough. Negative for shortness of breath, wheezing and stridor.   Cardiovascular: Negative for chest pain, palpitations and leg swelling.  Gastrointestinal: Negative for nausea, vomiting, abdominal pain, diarrhea, constipation, blood in stool, abdominal distention and anal bleeding.  Genitourinary: Negative for dysuria and flank pain.  Musculoskeletal: Negative for myalgias, arthralgias and gait problem.  Skin: Negative for color change and rash.  Neurological: Negative for dizziness and headaches.  Hematological: Negative for adenopathy. Does not bruise/bleed easily.  Psychiatric/Behavioral: Negative for suicidal ideas, sleep disturbance and dysphoric mood. The patient is not nervous/anxious.        Objective:   Physical Exam  Constitutional: She is oriented to person, place, and time. She appears well-developed and well-nourished.  No distress.  HENT:  Head: Normocephalic and atraumatic.  Right Ear: External ear normal.  Left Ear: External ear normal.  Nose: Nose normal.  Mouth/Throat: Oropharynx is clear and moist. No oropharyngeal exudate.  Eyes: Conjunctivae are normal. Pupils are equal, round, and reactive to light. Right eye exhibits no discharge. Left eye exhibits no discharge. No scleral icterus.  Neck: Normal range of motion. Neck supple. No tracheal deviation present. No thyromegaly present.  Cardiovascular: Normal rate, regular rhythm, normal heart sounds and intact distal pulses.  Exam reveals no gallop and no friction rub.   No murmur heard. Pulmonary/Chest: Effort normal and breath sounds normal. No respiratory distress. She has no wheezes. She has no rales. She exhibits no tenderness.  Musculoskeletal: Normal range of motion. She exhibits no edema and no tenderness.  Lymphadenopathy:    She has no cervical adenopathy.  Neurological: She is alert and oriented to person, place, and time. No cranial nerve deficit. She exhibits normal muscle tone. Coordination normal.  Skin: Skin is warm and dry. No rash noted. She is not diaphoretic. No erythema. No pallor.  Psychiatric: She has a normal mood and affect. Her behavior is normal. Judgment and thought content normal.          Assessment & Plan:

## 2011-10-10 NOTE — Assessment & Plan Note (Signed)
Symptoms and exam markedly improved with completion of steroid taper and antibiotics.  Will continue to monitor.

## 2011-10-10 NOTE — Assessment & Plan Note (Signed)
Repeat EGD planned for this week.

## 2011-10-10 NOTE — Assessment & Plan Note (Signed)
BG elevated with use of prednisone, now improving. Will check A1c in 1 month.

## 2011-10-12 ENCOUNTER — Ambulatory Visit: Payer: Self-pay | Admitting: Gastroenterology

## 2011-11-09 ENCOUNTER — Ambulatory Visit: Payer: Medicare Other | Admitting: Internal Medicine

## 2011-12-07 ENCOUNTER — Encounter: Payer: Self-pay | Admitting: Internal Medicine

## 2011-12-07 ENCOUNTER — Ambulatory Visit (INDEPENDENT_AMBULATORY_CARE_PROVIDER_SITE_OTHER): Payer: Medicare Other | Admitting: Internal Medicine

## 2011-12-07 VITALS — BP 110/62 | HR 62 | Temp 98.8°F | Ht 63.0 in | Wt 156.0 lb

## 2011-12-07 DIAGNOSIS — I10 Essential (primary) hypertension: Secondary | ICD-10-CM

## 2011-12-07 DIAGNOSIS — M069 Rheumatoid arthritis, unspecified: Secondary | ICD-10-CM

## 2011-12-07 DIAGNOSIS — E109 Type 1 diabetes mellitus without complications: Secondary | ICD-10-CM

## 2011-12-07 DIAGNOSIS — K219 Gastro-esophageal reflux disease without esophagitis: Secondary | ICD-10-CM

## 2011-12-07 DIAGNOSIS — E785 Hyperlipidemia, unspecified: Secondary | ICD-10-CM

## 2011-12-07 LAB — COMPREHENSIVE METABOLIC PANEL
Albumin: 3.8 g/dL (ref 3.5–5.2)
BUN: 11 mg/dL (ref 6–23)
CO2: 26 mEq/L (ref 19–32)
Calcium: 9.4 mg/dL (ref 8.4–10.5)
Chloride: 100 mEq/L (ref 96–112)
Glucose, Bld: 111 mg/dL — ABNORMAL HIGH (ref 70–99)
Potassium: 4.1 mEq/L (ref 3.5–5.1)
Sodium: 135 mEq/L (ref 135–145)
Total Protein: 7.5 g/dL (ref 6.0–8.3)

## 2011-12-07 LAB — HEMOGLOBIN A1C: Hgb A1c MFr Bld: 8.6 % — ABNORMAL HIGH (ref 4.6–6.5)

## 2011-12-07 NOTE — Assessment & Plan Note (Signed)
Lipids were above goal with LDL of 120 on last check. We'll plan to recheck lipids with labs in 3 months. Continue atorvastatin.

## 2011-12-07 NOTE — Assessment & Plan Note (Signed)
Symptoms stable. Currently on Plaquenil. Recently seen by rheumatology and planning to start Enbrel. Will continue to follow.

## 2011-12-07 NOTE — Assessment & Plan Note (Signed)
Symptoms improved after dilation of esophagus. Will continue to monitor.

## 2011-12-07 NOTE — Progress Notes (Signed)
Subjective:    Patient ID: [name], female    DOB: 06/24/1945, 66 y.o.   MRN: [id number]  HPI 66 year old female with history of diabetes mellitus, rheumatoid arthritis, hypertension, hyperlipidemia presents for followup. 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 She reports she tolerated this procedure well and symptoms of difficulty swallowing food have improved. Next  In regards to her diabetes, she did not bring a record of her blood sugar today. She reports that blood sugars have been quite labile which is typical for her. She continues to use the insulin pump. Next  In regards to hypertension and hyperlipidemia, she reports full compliance with her medications. She denies any noted side effects from her medicines.  She notes some recent increase in gas and belching. She questions that over the counter medication she might be able to take to help with this. As of yet, she has not tried anything.  Outpatient Encounter Prescriptions as of 12/07/2011  Medication Sig Dispense Refill  . amLODipine (NORVASC) 10 MG tablet Take 1 tablet (10 mg total) by mouth daily.  90 tablet  1  . aspirin 81 MG tablet Take 81 mg by mouth daily.        Marland Kitchen atorvastatin (LIPITOR) 20 MG tablet Take 1 tablet (20 mg total) by mouth daily.  90 tablet  2  . Calcium Carbonate Antacid (TUMS PO) Take 4 tablets by mouth daily.        . Cholecalciferol (VITAMIN D) 2000 UNITS CAPS Take 1 capsule by mouth daily.      . Dihydroxyaluminum Sod Carb (ROLAIDS PO) Take by mouth as needed.        . folic acid (FOLVITE) 1 MG tablet Take 1 tablet (1 mg total) by mouth daily.  90 tablet  1  . furosemide (LASIX) 40 MG tablet Take 1 tablet (40 mg total) by mouth 2 (two) times daily.  60 tablet  5  . hydroxychloroquine (PLAQUENIL) 200 MG tablet Take 400 mg by mouth daily.       Marland Kitchen ibuprofen (ADVIL,MOTRIN) 800 MG tablet Take 1 tablet (800 mg total) by mouth  3 (three) times daily as needed.  270 tablet  1  . InFLIXimab (REMICADE IV) Inject into the vein every 7 (seven) weeks.       . insulin lispro (HUMALOG) 100 UNIT/ML injection As directed via Insulin pump  30 mL  6  . Krill Oil 300 MG CAPS Take by mouth daily.      Marland Kitchen losartan (COZAAR) 100 MG tablet Take 1 tablet (100 mg total) by mouth daily.  90 tablet  1  . methotrexate (RHEUMATREX) 2.5 MG tablet Take 6 mg by mouth once a week.       . metoprolol (LOPRESSOR) 100 MG tablet Take 1 tablet (100 mg total) by mouth 2 (two) times daily.  180 tablet  2  . Multiple Vitamin (MULTIVITAMIN) tablet Take 1 tablet by mouth daily.        . pantoprazole (PROTONIX) 40 MG tablet Take 1 tablet (40 mg total) by mouth daily.  90 tablet  3  . predniSONE (DELTASONE) 5 MG tablet Take 5 mg by mouth daily as needed.       . pyridOXINE (VITAMIN B-6) 100 MG tablet Take 100 mg by mouth daily.        . Triamcinolone Acetonide (TRIAMCINOLONE 0.1 % CREAM : EUCERIN) CREA Apply 1 application topically 2 (two) times  daily as needed.  1 each  3  . zoledronic acid (RECLAST) 5 MG/100ML SOLN Inject 5 mg into the vein once.       BP 110/62  Pulse 62  Temp 98.8 F (37.1 C) (Oral)  Ht 5\' 3"  (1.6 m)  Wt 156 lb (70.761 kg)  BMI 27.63 kg/m2  SpO2 98%  Review of Systems  Constitutional: Negative for fever, chills, appetite change, fatigue and unexpected weight change.  HENT: Negative for ear pain, congestion, sore throat, trouble swallowing, neck pain, voice change and sinus pressure.   Eyes: Negative for visual disturbance.  Respiratory: Negative for cough, shortness of breath, wheezing and stridor.   Cardiovascular: Negative for chest pain, palpitations and leg swelling.  Gastrointestinal: Negative for nausea, vomiting, abdominal pain, diarrhea, constipation, blood in stool, abdominal distention and anal bleeding.  Genitourinary: Negative for dysuria and flank pain.  Musculoskeletal: Negative for myalgias, arthralgias and gait  problem.  Skin: Negative for color change and rash.  Neurological: Negative for dizziness and headaches.  Hematological: Negative for adenopathy. Does not bruise/bleed easily.  Psychiatric/Behavioral: Negative for suicidal ideas, disturbed wake/sleep cycle and dysphoric mood. The patient is not nervous/anxious.        Objective:   Physical Exam  Constitutional: She is oriented to person, place, and time. She appears well-developed and well-nourished. No distress.  HENT:  Head: Normocephalic and atraumatic.  Right Ear: External ear normal.  Left Ear: External ear normal.  Nose: Nose normal.  Mouth/Throat: Oropharynx is clear and moist. No oropharyngeal exudate.  Eyes: Conjunctivae are normal. Pupils are equal, round, and reactive to light. Right eye exhibits no discharge. Left eye exhibits no discharge. No scleral icterus.  Neck: Normal range of motion. Neck supple. No tracheal deviation present. No thyromegaly present.  Cardiovascular: Normal rate, regular rhythm, normal heart sounds and intact distal pulses.  Exam reveals no gallop and no friction rub.   No murmur heard. Pulmonary/Chest: Effort normal and breath sounds normal. No respiratory distress. She has no wheezes. She has no rales. She exhibits no tenderness.  Abdominal: Soft. Bowel sounds are normal. She exhibits no distension. There is no tenderness.  Musculoskeletal: Normal range of motion. She exhibits no edema and no tenderness.  Lymphadenopathy:    She has no cervical adenopathy.  Neurological: She is alert and oriented to person, place, and time. No cranial nerve deficit. She exhibits normal muscle tone. Coordination normal.  Skin: Skin is warm and dry. No rash noted. She is not diaphoretic. No erythema. No pallor.  Psychiatric: She has a normal mood and affect. Her behavior is normal. Judgment and thought content normal.          Assessment & Plan:

## 2011-12-07 NOTE — Patient Instructions (Signed)
Try Gas-X or Mylicon for gas. May also try Align - a pro-biotic to see if any improvement.

## 2011-12-07 NOTE — Assessment & Plan Note (Signed)
HgbA1c stable. Just above goal, but very brittle sugars so acceptable. Will continue insulin pump.  Repeat A1c in 3 months.

## 2011-12-07 NOTE — Assessment & Plan Note (Signed)
BP well controlled on current meds. Renal function normal on labs. Follow up 3 months.

## 2011-12-21 ENCOUNTER — Encounter: Payer: Self-pay | Admitting: Internal Medicine

## 2012-01-19 ENCOUNTER — Other Ambulatory Visit: Payer: Self-pay | Admitting: Internal Medicine

## 2012-01-19 MED ORDER — AMLODIPINE BESYLATE 10 MG PO TABS: 10.0000 mg | ORAL_TABLET | Freq: Every day | ORAL | Status: DC

## 2012-01-19 NOTE — Telephone Encounter (Signed)
amlodpipine besylate 10 mg TAB #90 take 1 tablet by mouth once a day

## 2012-02-02 ENCOUNTER — Other Ambulatory Visit: Payer: Self-pay | Admitting: Internal Medicine

## 2012-02-02 MED ORDER — LOSARTAN POTASSIUM 100 MG PO TABS: 100.0000 mg | ORAL_TABLET | Freq: Every day | ORAL | Status: DC

## 2012-02-02 MED ORDER — FOLIC ACID 1 MG PO TABS: 1.0000 mg | ORAL_TABLET | Freq: Every day | ORAL | Status: DC

## 2012-02-02 NOTE — Telephone Encounter (Signed)
Refill request for losartan potassium 100 mg #90 Sig: take 1 tablet by mouth once a day

## 2012-02-02 NOTE — Telephone Encounter (Signed)
Folic acid 1 mg tab #90 Sig: take 1 tablet by mouth once a day

## 2012-03-12 ENCOUNTER — Encounter: Payer: Self-pay | Admitting: Internal Medicine

## 2012-03-12 ENCOUNTER — Ambulatory Visit (INDEPENDENT_AMBULATORY_CARE_PROVIDER_SITE_OTHER): Payer: Medicare Other | Admitting: Internal Medicine

## 2012-03-12 VITALS — BP 140/62 | HR 56 | Temp 98.2°F | Ht 63.0 in | Wt 159.8 lb

## 2012-03-12 DIAGNOSIS — E109 Type 1 diabetes mellitus without complications: Secondary | ICD-10-CM

## 2012-03-12 DIAGNOSIS — K219 Gastro-esophageal reflux disease without esophagitis: Secondary | ICD-10-CM

## 2012-03-12 DIAGNOSIS — M069 Rheumatoid arthritis, unspecified: Secondary | ICD-10-CM

## 2012-03-12 NOTE — Assessment & Plan Note (Signed)
Symptoms well controlled on current medications. Pt was recently started on Embrel by rheumatologist and having some abdominal rash with this medication. Encouraged her to follow up with her rheumatologist about this. Follow up 3 months and prn.

## 2012-03-12 NOTE — Progress Notes (Signed)
Subjective:    Patient ID: [name], female    DOB: [birth year], 66 y.o.   MRN: [id number]  HPI 66 year old female with history of diabetes, hyperlipidemia, hypertension, rheumatoid arthritis presents for followup. In regards to her diabetes, she reports blood sugars have been fairly well-controlled. She continues on insulin pump. She has not had any issues with low blood sugars. In regards to rheumatoid arthritis, she reports that she was recently started on Enbrel. She reports no improvement on this medication. She has developed a rash over her abdomen and back after starting medication and is planning to talk to her rheumatologist about this at her next visit. She reports full compliance with her medications. She denies any new concerns today.  She does have chronic issues with dysphasia and has had esophageal dilation twice. She continues to have difficulty swallowing some foods such as potatoes and apples. She tries to limit intake of these foods as much as possible. She is not interested in repeat esophageal dilation.   Outpatient Encounter Prescriptions as of 03/12/2012  Medication Sig Dispense Refill  . amLODipine (NORVASC) 10 MG tablet Take 1 tablet (10 mg total) by mouth daily.  90 tablet  3  . aspirin 81 MG tablet Take 81 mg by mouth daily.        Marland Kitchen atorvastatin (LIPITOR) 20 MG tablet Take 1 tablet (20 mg total) by mouth daily.  90 tablet  2  . Calcium Carbonate Antacid (TUMS PO) Take 4 tablets by mouth daily.        . Cholecalciferol (VITAMIN D) 2000 UNITS CAPS Take 1 capsule by mouth daily.      . Dihydroxyaluminum Sod Carb (ROLAIDS PO) Take by mouth as needed.        . etanercept (ENBREL) 25 MG injection Inject 25 mg into the skin once a week.      . folic acid (FOLVITE) 1 MG tablet Take 1 tablet (1 mg total) by mouth daily.  90 tablet  3  . furosemide (LASIX) 40 MG tablet Take 1 tablet (40 mg total) by mouth 2 (two) times daily.  60 tablet  5  . ibuprofen (ADVIL,MOTRIN) 800 MG  tablet Take 1 tablet (800 mg total) by mouth 3 (three) times daily as needed.  270 tablet  1  . Krill Oil 300 MG CAPS Take by mouth daily.      Marland Kitchen losartan (COZAAR) 100 MG tablet Take 1 tablet (100 mg total) by mouth daily.  90 tablet  3  . methotrexate (RHEUMATREX) 2.5 MG tablet Take 4 mg by mouth once a week.       . metoprolol (LOPRESSOR) 100 MG tablet Take 1 tablet (100 mg total) by mouth 2 (two) times daily.  180 tablet  2  . Multiple Vitamin (MULTIVITAMIN) tablet Take 1 tablet by mouth daily.        . pantoprazole (PROTONIX) 40 MG tablet Take 1 tablet (40 mg total) by mouth daily.  90 tablet  3  . pyridOXINE (VITAMIN B-6) 100 MG tablet Take 100 mg by mouth daily.        . Triamcinolone Acetonide (TRIAMCINOLONE 0.1 % CREAM : EUCERIN) CREA Apply 1 application topically 2 (two) times daily as needed.  1 each  3  . zoledronic acid (RECLAST) 5 MG/100ML SOLN Inject 5 mg into the vein once.      . insulin lispro (HUMALOG) 100 UNIT/ML injection As directed via Insulin pump  30 mL  6   BP 140/62  Pulse  56  Temp 98.2 F (36.8 C) (Oral)  Ht 5\' 3"  (1.6 m)  Wt 159 lb 12 oz (72.462 kg)  BMI 28.30 kg/m2  SpO2 98%  Review of Systems  Constitutional: Negative for fever, chills, appetite change, fatigue and unexpected weight change.  HENT: Negative for ear pain, congestion, sore throat, trouble swallowing, neck pain, voice change and sinus pressure.   Eyes: Negative for visual disturbance.  Respiratory: Negative for cough, shortness of breath, wheezing and stridor.   Cardiovascular: Negative for chest pain, palpitations and leg swelling.  Gastrointestinal: Negative for nausea, vomiting, abdominal pain, diarrhea, constipation, blood in stool, abdominal distention and anal bleeding.  Genitourinary: Negative for dysuria and flank pain.  Musculoskeletal: Negative for myalgias, arthralgias and gait problem.  Skin: Positive for rash. Negative for color change.  Neurological: Negative for dizziness and  headaches.  Hematological: Negative for adenopathy. Does not bruise/bleed easily.  Psychiatric/Behavioral: Negative for suicidal ideas, sleep disturbance and dysphoric mood. The patient is not nervous/anxious.        Objective:   Physical Exam  Constitutional: She is oriented to person, place, and time. She appears well-developed and well-nourished. No distress.  HENT:  Head: Normocephalic and atraumatic.  Right Ear: External ear normal.  Left Ear: External ear normal.  Nose: Nose normal.  Mouth/Throat: Oropharynx is clear and moist. No oropharyngeal exudate.  Eyes: Conjunctivae normal are normal. Pupils are equal, round, and reactive to light. Right eye exhibits no discharge. Left eye exhibits no discharge. No scleral icterus.  Neck: Normal range of motion. Neck supple. No tracheal deviation present. No thyromegaly present.  Cardiovascular: Normal rate, regular rhythm, normal heart sounds and intact distal pulses.  Exam reveals no gallop and no friction rub.   No murmur heard. Pulmonary/Chest: Effort normal and breath sounds normal. No respiratory distress. She has no wheezes. She has no rales. She exhibits no tenderness.  Musculoskeletal: Normal range of motion. She exhibits no edema and no tenderness.  Lymphadenopathy:    She has no cervical adenopathy.  Neurological: She is alert and oriented to person, place, and time. No cranial nerve deficit. She exhibits normal muscle tone. Coordination normal.  Skin: Skin is warm and dry. Rash (erythematous papules over lower abdomen and back) noted. She is not diaphoretic. No erythema. No pallor.  Psychiatric: She has a normal mood and affect. Her behavior is normal. Judgment and thought content normal.          Assessment & Plan:

## 2012-03-12 NOTE — Assessment & Plan Note (Signed)
S/p esophageal dilation x 2, however still having some dysphagia. Pt prefers to avoid additional interventions for now. Will continue to modify diet to help with symptoms.

## 2012-03-12 NOTE — Assessment & Plan Note (Signed)
Pt reports good control of BG. Will check A1c with labs today. Continue current medications. Follow up in 3 months and prn.

## 2012-03-13 ENCOUNTER — Other Ambulatory Visit (INDEPENDENT_AMBULATORY_CARE_PROVIDER_SITE_OTHER): Payer: Medicare Other

## 2012-03-13 DIAGNOSIS — E109 Type 1 diabetes mellitus without complications: Secondary | ICD-10-CM

## 2012-03-13 LAB — COMPREHENSIVE METABOLIC PANEL
ALT: 24 U/L (ref 0–35)
Albumin: 3.9 g/dL (ref 3.5–5.2)
Alkaline Phosphatase: 43 U/L (ref 39–117)
CO2: 28 mEq/L (ref 19–32)
Potassium: 3.9 mEq/L (ref 3.5–5.1)
Sodium: 136 mEq/L (ref 135–145)
Total Bilirubin: 0.6 mg/dL (ref 0.3–1.2)
Total Protein: 7 g/dL (ref 6.0–8.3)

## 2012-03-13 LAB — HEMOGLOBIN A1C: Hgb A1c MFr Bld: 8.1 % — ABNORMAL HIGH (ref 4.6–6.5)

## 2012-03-13 LAB — MICROALBUMIN / CREATININE URINE RATIO
Microalb Creat Ratio: 6 mg/g (ref 0.0–30.0)
Microalb, Ur: 1.1 mg/dL (ref 0.0–1.9)

## 2012-03-13 LAB — LIPID PANEL: VLDL: 10.8 mg/dL (ref 0.0–40.0)

## 2012-05-22 ENCOUNTER — Other Ambulatory Visit: Payer: Self-pay | Admitting: Internal Medicine

## 2012-05-22 DIAGNOSIS — E785 Hyperlipidemia, unspecified: Secondary | ICD-10-CM

## 2012-05-22 MED ORDER — ATORVASTATIN CALCIUM 20 MG PO TABS: 20.0000 mg | ORAL_TABLET | Freq: Every day | ORAL | Status: DC

## 2012-05-22 NOTE — Telephone Encounter (Signed)
rx sent to pharmacy by e-script  

## 2012-05-22 NOTE — Telephone Encounter (Signed)
atorvastatin (LIPITOR) 20 MG tablet  #90

## 2012-06-11 ENCOUNTER — Other Ambulatory Visit: Payer: Self-pay | Admitting: *Deleted

## 2012-06-11 MED ORDER — METOPROLOL TARTRATE 100 MG PO TABS: 100.0000 mg | ORAL_TABLET | Freq: Two times a day (BID) | ORAL | Status: DC

## 2012-06-13 ENCOUNTER — Ambulatory Visit: Payer: Medicare Other | Admitting: Internal Medicine

## 2012-06-20 ENCOUNTER — Other Ambulatory Visit: Payer: Self-pay | Admitting: *Deleted

## 2012-06-20 MED ORDER — IBUPROFEN 800 MG PO TABS: 800.0000 mg | ORAL_TABLET | Freq: Three times a day (TID) | ORAL | Status: DC | PRN

## 2012-06-20 NOTE — Telephone Encounter (Signed)
Would you like to refill the 800 mg of Ibuprofen

## 2012-06-26 ENCOUNTER — Other Ambulatory Visit: Payer: Self-pay | Admitting: *Deleted

## 2012-06-28 MED ORDER — INSULIN LISPRO 100 UNIT/ML ~~LOC~~ SOLN: SUBCUTANEOUS | Status: DC

## 2012-07-02 ENCOUNTER — Other Ambulatory Visit: Payer: Self-pay | Admitting: *Deleted

## 2012-07-02 DIAGNOSIS — K219 Gastro-esophageal reflux disease without esophagitis: Secondary | ICD-10-CM

## 2012-07-02 MED ORDER — PANTOPRAZOLE SODIUM 40 MG PO TBEC: 40.0000 mg | DELAYED_RELEASE_TABLET | Freq: Every day | ORAL | Status: DC

## 2012-07-02 MED ORDER — IBUPROFEN 800 MG PO TABS: 800.0000 mg | ORAL_TABLET | Freq: Three times a day (TID) | ORAL | Status: DC | PRN

## 2012-07-02 NOTE — Telephone Encounter (Signed)
No medications listed in document

## 2012-07-05 ENCOUNTER — Other Ambulatory Visit: Payer: Self-pay | Admitting: *Deleted

## 2012-07-05 MED ORDER — INSULIN LISPRO 100 UNIT/ML ~~LOC~~ SOLN: SUBCUTANEOUS | Status: DC

## 2012-07-05 MED ORDER — IBUPROFEN 800 MG PO TABS: 800.0000 mg | ORAL_TABLET | Freq: Three times a day (TID) | ORAL | Status: DC | PRN

## 2012-07-05 NOTE — Telephone Encounter (Signed)
Rx faxed to pharmacy with notation for patient to call office to schedule an appointment for an annual exam

## 2012-07-16 ENCOUNTER — Encounter: Payer: Self-pay | Admitting: Internal Medicine

## 2012-07-16 ENCOUNTER — Ambulatory Visit (INDEPENDENT_AMBULATORY_CARE_PROVIDER_SITE_OTHER): Payer: Medicare Other | Admitting: Internal Medicine

## 2012-07-16 VITALS — BP 120/70 | HR 64 | Temp 97.8°F | Ht 63.0 in | Wt 158.0 lb

## 2012-07-16 DIAGNOSIS — I10 Essential (primary) hypertension: Secondary | ICD-10-CM

## 2012-07-16 DIAGNOSIS — K219 Gastro-esophageal reflux disease without esophagitis: Secondary | ICD-10-CM

## 2012-07-16 DIAGNOSIS — E109 Type 1 diabetes mellitus without complications: Secondary | ICD-10-CM

## 2012-07-16 LAB — COMPREHENSIVE METABOLIC PANEL
Alkaline Phosphatase: 54 U/L (ref 39–117)
BUN: 12 mg/dL (ref 6–23)
CO2: 26 mEq/L (ref 19–32)
Creatinine, Ser: 0.7 mg/dL (ref 0.4–1.2)
GFR: 88.29 mL/min (ref >60.00)
Glucose, Bld: 188 mg/dL — ABNORMAL HIGH (ref 70–99)
Total Bilirubin: 0.6 mg/dL (ref 0.3–1.2)

## 2012-07-16 MED ORDER — INSULIN LISPRO 100 UNIT/ML ~~LOC~~ SOLN: SUBCUTANEOUS | Status: DC

## 2012-07-16 NOTE — Assessment & Plan Note (Signed)
Patient reports good control of blood sugars. Will check A1c with labs today. Continue insulin pump.

## 2012-07-16 NOTE — Assessment & Plan Note (Signed)
BP Readings from Last 3 Encounters:  07/16/12 120/70  03/12/12 140/62  12/07/11 110/62   BP well controlled on current medications. Will check renal function with labs today. Follow up 3 months and prn.

## 2012-07-16 NOTE — Progress Notes (Signed)
Subjective:    Patient ID: [name], female    DOB: 02/21/1946, 67 y.o.   MRN: [id number]  HPI 67 year old female with history of hypertension, diabetes, rheumatoid arthritis, and esophageal stricture presents for followup. In regards to her diabetes, she reports blood sugars have been relatively well controlled. She is compliant with insulin pump. She denies any recent low blood sugars less than 60 or persistent elevated blood sugars greater than 250.  She is concerned today about persistent symptoms of dysphasia and occasional nausea and vomiting of food. She is status post esophageal dilation x2, last of which was in June 2013. She continues to have difficulty with solid foods. She would like to get a second opinion about any additional interventions that might be helpful. She has not had any weight loss.  Outpatient Encounter Prescriptions as of 07/16/2012  Medication Sig Dispense Refill  . amLODipine (NORVASC) 10 MG tablet Take 1 tablet (10 mg total) by mouth daily.  90 tablet  3  . aspirin 81 MG tablet Take 81 mg by mouth daily.        Marland Kitchen atorvastatin (LIPITOR) 20 MG tablet Take 1 tablet (20 mg total) by mouth daily.  90 tablet  2  . Calcium Carbonate Antacid (TUMS PO) Take 4 tablets by mouth daily.        . Cholecalciferol (VITAMIN D) 2000 UNITS CAPS Take 1 capsule by mouth daily.      . Dihydroxyaluminum Sod Carb (ROLAIDS PO) Take by mouth as needed.        . etanercept (ENBREL) 25 MG injection Inject 25 mg into the skin once a week.      . folic acid (FOLVITE) 1 MG tablet Take 1 tablet (1 mg total) by mouth daily.  90 tablet  3  . furosemide (LASIX) 40 MG tablet Take 1 tablet (40 mg total) by mouth 2 (two) times daily.  60 tablet  5  . ibuprofen (ADVIL,MOTRIN) 800 MG tablet Take 1 tablet (800 mg total) by mouth 3 (three) times daily as needed.  160 tablet  0  . insulin lispro (HUMALOG) 100 UNIT/ML injection As directed via Insulin pump  30 mL  11  . Krill Oil 300 MG CAPS Take by  mouth daily.      Marland Kitchen losartan (COZAAR) 100 MG tablet Take 1 tablet (100 mg total) by mouth daily.  90 tablet  3  . methotrexate (RHEUMATREX) 2.5 MG tablet Take 4 mg by mouth once a week.       . metoprolol (LOPRESSOR) 100 MG tablet Take 1 tablet (100 mg total) by mouth 2 (two) times daily.  180 tablet  1  . Multiple Vitamin (MULTIVITAMIN) tablet Take 1 tablet by mouth daily.        . pantoprazole (PROTONIX) 40 MG tablet Take 1 tablet (40 mg total) by mouth daily.  90 tablet  0  . pyridOXINE (VITAMIN B-6) 100 MG tablet Take 100 mg by mouth daily.        . Triamcinolone Acetonide (TRIAMCINOLONE 0.1 % CREAM : EUCERIN) CREA Apply 1 application topically 2 (two) times daily as needed.  1 each  3  . zoledronic acid (RECLAST) 5 MG/100ML SOLN Inject 5 mg into the vein once.      . [DISCONTINUED] insulin lispro (HUMALOG) 100 UNIT/ML injection As directed via Insulin pump  30 mL  0   No facility-administered encounter medications on file as of 07/16/2012.   BP 120/70  Pulse 64  Temp(Src) 97.8 F (  36.6 C) (Oral)  Ht 5\' 3"  (1.6 m)  Wt 158 lb (71.668 kg)  BMI 28 kg/m2  SpO2 98%  Review of Systems  Constitutional: Negative for fever, chills, appetite change, fatigue and unexpected weight change.  HENT: Positive for trouble swallowing. Negative for ear pain, congestion, sore throat, neck pain, voice change and sinus pressure.   Eyes: Negative for visual disturbance.  Respiratory: Negative for cough, shortness of breath, wheezing and stridor.   Cardiovascular: Negative for chest pain, palpitations and leg swelling.  Gastrointestinal: Negative for nausea, vomiting, abdominal pain, diarrhea, constipation, blood in stool, abdominal distention and anal bleeding.  Genitourinary: Negative for dysuria and flank pain.  Musculoskeletal: Negative for myalgias, arthralgias and gait problem.  Skin: Negative for color change and rash.  Neurological: Negative for dizziness and headaches.  Hematological: Negative  for adenopathy. Does not bruise/bleed easily.  Psychiatric/Behavioral: Negative for suicidal ideas, sleep disturbance and dysphoric mood. The patient is not nervous/anxious.        Objective:   Physical Exam  Constitutional: She is oriented to person, place, and time. She appears well-developed and well-nourished. No distress.  HENT:  Head: Normocephalic and atraumatic.  Right Ear: External ear normal.  Left Ear: External ear normal.  Nose: Nose normal.  Mouth/Throat: Oropharynx is clear and moist. No oropharyngeal exudate.  Eyes: Conjunctivae are normal. Pupils are equal, round, and reactive to light. Right eye exhibits no discharge. Left eye exhibits no discharge. No scleral icterus.  Neck: Normal range of motion. Neck supple. No tracheal deviation present. No thyromegaly present.  Cardiovascular: Normal rate, regular rhythm, normal heart sounds and intact distal pulses.  Exam reveals no gallop and no friction rub.   No murmur heard. Pulmonary/Chest: Effort normal and breath sounds normal. No respiratory distress. She has no wheezes. She has no rales. She exhibits no tenderness.  Musculoskeletal: Normal range of motion. She exhibits no edema and no tenderness.  Lymphadenopathy:    She has no cervical adenopathy.  Neurological: She is alert and oriented to person, place, and time. No cranial nerve deficit. She exhibits normal muscle tone. Coordination normal.  Skin: Skin is warm and dry. No rash noted. She is not diaphoretic. No erythema. No pallor.  Psychiatric: She has a normal mood and affect. Her behavior is normal. Judgment and thought content normal.          Assessment & Plan:

## 2012-07-16 NOTE — Assessment & Plan Note (Signed)
Persistent symptoms of difficulty swallowing foods and occasional nausea after swallowing. Status post esophageal dilation x2. Last in June 2013. Patient would like to get a second opinion and see if any additional interventions might be helpful. Will set up evaluation with Dr. Rhea Belton.

## 2012-07-31 ENCOUNTER — Encounter: Payer: Self-pay | Admitting: Internal Medicine

## 2012-08-01 ENCOUNTER — Encounter: Payer: Self-pay | Admitting: Internal Medicine

## 2012-08-01 ENCOUNTER — Ambulatory Visit (INDEPENDENT_AMBULATORY_CARE_PROVIDER_SITE_OTHER): Payer: Medicare Other | Admitting: Internal Medicine

## 2012-08-01 VITALS — BP 138/56 | HR 70 | Ht 64.0 in | Wt 158.0 lb

## 2012-08-01 DIAGNOSIS — Q391 Atresia of esophagus with tracheo-esophageal fistula: Secondary | ICD-10-CM

## 2012-08-01 DIAGNOSIS — K219 Gastro-esophageal reflux disease without esophagitis: Secondary | ICD-10-CM

## 2012-08-01 DIAGNOSIS — R1310 Dysphagia, unspecified: Secondary | ICD-10-CM

## 2012-08-01 DIAGNOSIS — K222 Esophageal obstruction: Secondary | ICD-10-CM

## 2012-08-01 DIAGNOSIS — Q393 Congenital stenosis and stricture of esophagus: Secondary | ICD-10-CM

## 2012-08-01 MED ORDER — FLUCONAZOLE 100 MG PO TABS
200.0000 mg | ORAL_TABLET | Freq: Every day | ORAL | Status: DC
Start: 2012-08-01 — End: 2012-09-11

## 2012-08-01 MED ORDER — DEXLANSOPRAZOLE 60 MG PO CPDR
60.0000 mg | DELAYED_RELEASE_CAPSULE | Freq: Every day | ORAL | Status: DC
Start: 2012-08-01 — End: 2012-08-13

## 2012-08-01 NOTE — Progress Notes (Signed)
Patient ID: [name], female   DOB: December 06, 1945, 67 y.o.   MRN: [id number] HPI: [name] is a 67 yo female with PMH of rheumatoid arthritis, diabetes, GERD with history of erosive esophagitis, hypertension, hyperlipidemia, Schatzki's ring, and history of candidal esophagitis who seen in consultation at the request of Dr. Dan [name] for evaluation of dysphagia and heartburn.  The patient reports she had previously been seen in [facility] by Dr. Bluford [name], and is underwent 3 upper endoscopies and a colonoscopy. She reports ongoing issues with solid food and pill dysphagia. She has no trouble swallowing liquids. She reports she has deep very slowly and occasionally has some regurgitation of food. She denies odynophagia. She does report burping seemed to help her dysphagia symptoms. She denies nausea or vomiting. No abdominal pain. She does still have heartburn 3 or more days per week requiring her to use TUMS. She is taking pantoprazole 40 mg once daily. She previously tried omeprazole and Nexium but this caused diarrhea. She reports her bowel movements are somewhat erratic and that occasionally they are loose and other times she has constipation. She denies blood in her stool or melena.  Her last upper endoscopy with dilatation was in June 2013. She reports this did help with her dysphagia symptoms but only seemed to last about 6 months. She reports return of her solid food dysphagia symptoms around December 2013. She also recalls previously being treated for a "infection" and the antibiotic seemed to significantly help her swallowing dysfunction at that time. (This is believed to be Candida infection)  Past Medical History  Diagnosis Date  . Leukopenia 2012    s/p bone marrow biopsy, Dr. Sherrlyn [name]  . Cholelithiasis   . Rheumatoid arthritis   . Diabetes mellitus   . GERD (gastroesophageal reflux disease)   . Hypertension   . Hyperlipidemia   . Esophageal stricture     Past Surgical History   Procedure Laterality Date  . Esophagogastroduodenoscopy  2012    Dr. Skeet [name]  . Cataract extraction  2010    right    Current Outpatient Prescriptions  Medication Sig Dispense Refill  . amLODipine (NORVASC) 10 MG tablet Take 1 tablet (10 mg total) by mouth daily.  90 tablet  3  . aspirin 81 MG tablet Take 81 mg by mouth daily.        Marland Kitchen atorvastatin (LIPITOR) 20 MG tablet Take 1 tablet (20 mg total) by mouth daily.  90 tablet  2  . Calcium Carbonate Antacid (TUMS PO) Take 4 tablets by mouth daily.        . Cholecalciferol (VITAMIN D) 2000 UNITS CAPS Take 1 capsule by mouth daily.      . Dihydroxyaluminum Sod Carb (ROLAIDS PO) Take by mouth as needed.        . etanercept (ENBREL) 25 MG injection Inject 25 mg into the skin once a week.      . folic acid (FOLVITE) 1 MG tablet Take 1 tablet (1 mg total) by mouth daily.  90 tablet  3  . furosemide (LASIX) 40 MG tablet Take 1 tablet (40 mg total) by mouth 2 (two) times daily.  60 tablet  5  . ibuprofen (ADVIL,MOTRIN) 800 MG tablet Take 1 tablet (800 mg total) by mouth 3 (three) times daily as needed.  160 tablet  0  . insulin lispro (HUMALOG) 100 UNIT/ML injection As directed via Insulin pump  30 mL  11  . Krill Oil 300 MG CAPS Take by mouth daily.      Marland Kitchen  losartan (COZAAR) 100 MG tablet Take 1 tablet (100 mg total) by mouth daily.  90 tablet  3  . methotrexate (RHEUMATREX) 2.5 MG tablet Take 4 mg by mouth once a week.       . metoprolol (LOPRESSOR) 100 MG tablet Take 1 tablet (100 mg total) by mouth 2 (two) times daily.  180 tablet  1  . Multiple Vitamin (MULTIVITAMIN) tablet Take 1 tablet by mouth daily.        Marland Kitchen pyridOXINE (VITAMIN B-6) 100 MG tablet Take 100 mg by mouth daily.        . Triamcinolone Acetonide (TRIAMCINOLONE 0.1 % CREAM : EUCERIN) CREA Apply 1 application topically 2 (two) times daily as needed.  1 each  3  . zoledronic acid (RECLAST) 5 MG/100ML SOLN Inject 5 mg into the vein once.      Marland Kitchen dexlansoprazole (DEXILANT) 60 MG  capsule Take 1 capsule (60 mg total) by mouth daily.  90 capsule  3  . fluconazole (DIFLUCAN) 100 MG tablet Take 2 tablets (200 mg total) by mouth daily.  16 tablet  0   No current facility-administered medications for this visit.    Allergies  Allergen Reactions  . Codeine     nausea    Family History  Problem Relation Age of Onset  . Diabetes Mother   . Arthritis Mother   . Diabetes Father   . Arthritis Father   . Bone cancer Sister     History   Social History  . Marital Status: Married    Spouse Name: N/A    Number of Children: 3  . Years of Education: N/A   Occupational History  . Retired    Social History Main Topics  . Smoking status: Former Smoker    Types: Cigarettes    Quit date: 06/11/1994  . Smokeless tobacco: Never Used  . Alcohol Use: No  . Drug Use: No  . Sexually Active: None   Other Topics Concern  . None   Social History Narrative  . None    ROS: As per history of present illness, otherwise negative  BP 138/56  Pulse 70  Ht 5\' 4"  (1.626 m)  Wt 158 lb (71.668 kg)  BMI 27.11 kg/m2 Constitutional: Well-developed and well-nourished. No distress. HEENT: Normocephalic and atraumatic. Oropharynx is clear and moist. No oropharyngeal exudate. Conjunctivae are normal.  No scleral icterus. Neck: Neck supple. Trachea midline. Cardiovascular: Normal rate, regular rhythm and intact distal pulses.  Pulmonary/chest: Effort normal and breath sounds normal. No wheezing, rales or rhonchi. Abdominal: Soft, nontender, nondistended. Bowel sounds active throughout. There are no masses palpable. No hepatosplenomegaly. Extremities: no clubbing, cyanosis, or edema Lymphadenopathy: No cervical adenopathy noted. Neurological: Alert and oriented to person place and time. Skin: Skin is warm and dry. No rashes noted. Psychiatric: Normal mood and affect. Behavior is normal.  RELEVANT LABS AND IMAGING: CBC    Component Value Date/Time   WBC 4.8 07/06/2011 1423    RBC 3.99 07/06/2011 1423   HGB 12.0 07/06/2011 1423   HCT 36.3 07/06/2011 1423   PLT 200.0 07/06/2011 1423   MCV 91.1 07/06/2011 1423   MCHC 33.2 07/06/2011 1423   RDW 14.9* 07/06/2011 1423   LYMPHSABS 1.2 07/06/2011 1423   MONOABS 0.5 07/06/2011 1423   EOSABS 0.1 07/06/2011 1423   BASOSABS 0.1 07/06/2011 1423    CMP     Component Value Date/Time   NA 133* 07/16/2012 1130   K 3.8 07/16/2012 1130   CL 98  07/16/2012 1130   CO2 26 07/16/2012 1130   GLUCOSE 188* 07/16/2012 1130   BUN 12 07/16/2012 1130   CREATININE 0.7 07/16/2012 1130   CALCIUM 9.0 07/16/2012 1130   PROT 7.5 07/16/2012 1130   ALBUMIN 3.9 07/16/2012 1130   AST 27 07/16/2012 1130   ALT 23 07/16/2012 1130   ALKPHOS 54 07/16/2012 1130   BILITOT 0.6 07/16/2012 1130   EGD 10/12/2011 - 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 Successfully dilated with 54 French Maloney dilator. Exam otherwise unremarkable. Small hiatus hernia. EGD 06/29/2011 - moderately severe Candida esophagitis, benign-appearing esophageal stricture. Dilated with TTS balloon 18 mm.  Otherwise normal examination EGD 05/12/2010 - LA grade D. reflux esophagitis, low-grade narrowing Schatzki's ring, gastritis, normal duodenum. Colonoscopy 05/11/2009 - exam to the cecum, bowel preparation was good. Internal hemorrhoids, otherwise normal.  ASSESSMENT/PLAN:  67 yo female with PMH of rheumatoid arthritis, diabetes, GERD with history of erosive esophagitis, hypertension, hyperlipidemia, Schatzki's ring, and history of candidal esophagitis who seen in consultation at the request of Dr. Dan [name] for evaluation of dysphagia and heartburn  1.  Dysphagia/GERD -- the patient still has heartburn symptoms despite daily pantoprazole therapy. Certainly reflux induced esophagitis could contribute to dysphagia symptoms (she has a history of erosive esophagitis), and given her persistent symptoms, I will change her PPI to Dexilant 60 mg daily. Hopefully this will provide better  control of her GERD symptoms, and in doing so it may improve her dysphagia. Also given her history of Candida esophagitis, I will empirically treat her with fluconazole x14 days. I would like for her to call us in 2 weeks to notify us as to whether she has improved significantly with the 2 medication changes. If so she would not require another upper endoscopy. If her symptoms persist, she may in fact need another endoscopy, but the durability of the dilation for Schatzki's ring is variable. In the past she has not had sustained benefit, despite being dilated to 18 mm. He could try repeat dilation, though I would be hesitant to go much larger than 18 mm. We could also try to biopsy the stricture to disrupt it more, if she requires repeat upper endoscopy.  2.  CRC screening -- she is up-to-date with colonoscopy in 2011. Repeat would be due 2021

## 2012-08-01 NOTE — Patient Instructions (Addendum)
We have sent the following medications to your pharmacy for you to pick up at your convenience: Dexilant 60mg   And Diflucan; you will take 200mg  for 1 day amd 100mg  for 13 days   Follow up with Dr. Rhea Belton in 4 weeks.  Please call us in 2 weeks to let us know how you are doing.                                               We are excited to introduce MyChart, a new best-in-class service that provides you online access to important information in your electronic medical record. We want to make it easier for you to view your health information - all in one secure location - when and where you need it. We expect MyChart will enhance the quality of care and service we provide.  When you register for MyChart, you can:    View your test results.    Request appointments and receive appointment reminders via email.    Request medication renewals.    View your medical history, allergies, medications and immunizations.    Communicate with your physician's office through a password-protected site.    Conveniently print information such as your medication lists.  To find out if MyChart is right for you, please talk to a member of our clinical staff today. We will gladly answer your questions about this free health and wellness tool.  If you are age 69 or older and want a member of your family to have access to your record, you must provide written consent by completing a proxy form available at our office. Please speak to our clinical staff about guidelines regarding accounts for patients younger than age 46.  As you activate your MyChart account and need any technical assistance, please call the MyChart technical support line at [phone]) or email your question to [email or web address]. If you email your question(s), please include your name, a return phone number and the best time to reach you.  If you have non-urgent health-related questions, you can send a message to our  office through MyChart at [email or web address]. If you have a medical emergency, call 911.  Thank you for using MyChart as your new health and wellness resource!   MyChart licensed from [employer],  [id number]. Patents Pending.

## 2012-08-07 ENCOUNTER — Telehealth: Payer: Self-pay | Admitting: Internal Medicine

## 2012-08-07 NOTE — Telephone Encounter (Signed)
Patient reports urgent watery diarrhea that started last Saturday.  She has held her Dexilant since last Saturday, and stopped the diflucan yesterday.  She reports that she has had 5 loose watery BM with urgency today and yesterday.  No other sick contacts.  She has had a few incontinent episodes with this.  Dr. Rhea Belton please advise

## 2012-08-07 NOTE — Telephone Encounter (Signed)
Patient advised of Dr. Lauro Franklin recommendations.  She is asked to call back Thursday or Friday with an update and instructions for fluconazole.  Patient verbalized understanding.

## 2012-08-07 NOTE — Telephone Encounter (Signed)
We had tried Dexilant in place of pantoprazole for ongoing GERD and dysphagia.  She was intolerant to omeprazole and Nexium due to diarrhea.  I expect her diarrhea is Dexilant related. Would change back to pantoprazole 40 mg daily, but add an afternoon dose 30 minutes before last meal of the day (thus BID dosing) I doubt this is secondary to fluconazole, which was started empirically for candida esophagitis.   For now she can use loperamide for diarrhea.  If she develops fever, chills, blood in her stool, or worsening diarrhea stool studies can be performed Have her call us later in the week and update [place] on how she is doing. We may resume fluconazole if diarrhea abates after changing back to her previous PPI

## 2012-08-10 ENCOUNTER — Telehealth: Payer: Self-pay | Admitting: Internal Medicine

## 2012-08-13 MED ORDER — PANTOPRAZOLE SODIUM 40 MG PO TBEC: DELAYED_RELEASE_TABLET | ORAL | Status: DC

## 2012-08-13 NOTE — Telephone Encounter (Signed)
Pt seen 08/01/12 for dysphagia and worsening GERD; frequent dilations and Schatzki's Ring. She was placed on Dexilant and Fluconazole for 14 days. If pt does not show improvement, EGD to be done. lmom for pt to call back.

## 2012-08-13 NOTE — Telephone Encounter (Signed)
Beverley Fiedler, MD at 08/07/2012 12:17 PM   Status: Signed            We had tried Dexilant in place of pantoprazole for ongoing GERD and dysphagia. She was intolerant to omeprazole and Nexium due to diarrhea. I expect her diarrhea is Dexilant related.  Would change back to pantoprazole 40 mg daily, but add an afternoon dose 30 minutes before last meal of the day (thus BID dosing)  I doubt this is secondary to fluconazole, which was started empirically for candida esophagitis.  For now she can use loperamide for diarrhea. If she develops fever, chills, blood in her stool, or worsening diarrhea stool studies can be performed  Have her call us later in the week and update [place] on how she is doing.  We may resume fluconazole if diarrhea abates after changing back to her previous PPI    I'm sorry, I did not see this note. Today, pt reports feeling fine since she increased the pantoprazole to BID; she is not taking Dexilant. She was also to stop fluconazole in case it was the cause of diarrhea. She reports her swallowing is better and she has no diarrhea. She asked [place] to refill her pantoprazole which we did. Is pt to restart the Fluconazole? Thanks.

## 2012-08-13 NOTE — Telephone Encounter (Signed)
If her swallowing has improved with BID pantoprazole, then I would not resume fluconazole. Dexilant likely explains diarrhea Glad to hear swallowing is better. Have her followup in office in about a month

## 2012-08-14 ENCOUNTER — Encounter: Payer: Self-pay | Admitting: Internal Medicine

## 2012-08-14 NOTE — Telephone Encounter (Signed)
Informed pt of Dr Lauro Franklin orders/recommendations. She will f/u on 09/11/12; pt stated understanding.

## 2012-08-30 DIAGNOSIS — I214 Non-ST elevation (NSTEMI) myocardial infarction: Secondary | ICD-10-CM

## 2012-08-30 HISTORY — DX: Non-ST elevation (NSTEMI) myocardial infarction: I21.4

## 2012-08-31 ENCOUNTER — Ambulatory Visit: Payer: Self-pay | Admitting: Rheumatology

## 2012-09-07 ENCOUNTER — Encounter: Payer: Self-pay | Admitting: Internal Medicine

## 2012-09-11 ENCOUNTER — Ambulatory Visit (INDEPENDENT_AMBULATORY_CARE_PROVIDER_SITE_OTHER): Payer: Medicare Other | Admitting: Internal Medicine

## 2012-09-11 ENCOUNTER — Encounter: Payer: Self-pay | Admitting: Internal Medicine

## 2012-09-11 VITALS — BP 146/60 | HR 68 | Ht 63.0 in | Wt 158.4 lb

## 2012-09-11 DIAGNOSIS — R1314 Dysphagia, pharyngoesophageal phase: Secondary | ICD-10-CM

## 2012-09-11 DIAGNOSIS — B3781 Candidal esophagitis: Secondary | ICD-10-CM

## 2012-09-11 DIAGNOSIS — R1310 Dysphagia, unspecified: Secondary | ICD-10-CM

## 2012-09-11 MED ORDER — FLUCONAZOLE 100 MG PO TABS: 100.0000 mg | ORAL_TABLET | Freq: Every day | ORAL | Status: DC

## 2012-09-11 NOTE — Progress Notes (Signed)
  Subjective:    Patient ID: [name], female    DOB: 1946/01/13, 67 y.o.   MRN: [id number]  HPI [name] is a 67 yo female with PMH of rheumatoid arthritis, diabetes, GERD with history of erosive esophagitis, hypertension, hyperlipidemia, Schatzki's ring, and history of candidal esophagitis who seen in followup. She was last seen on 08/01/2012 to evaluate ongoing issues with dysphagia. She had previous upper endoscopies and dilatation with fairly prompt return of dysphagia symptoms. At her last visit I changed her PPI from pantoprazole to Dexilant 60 mg daily and treated her empirically with fluconazole for esophageal candidiasis. The Dexilant cause significant diarrhea and she discontinued it. She actually discontinued both Dexilant and fluconazole, but after her diarrhea resolved she resumed a fluconazole and completed the course. With this she reported complete resolution of her esophageal dysphagia and all heartburn. She reports she was feeling great and swallowing "normally".  This lasted during the course of treatment and for several weeks afterwards, but over the last 5-6 days she's had return of her dysphagia symptoms. This is again more related to solids such as breads and meats. She denies abdominal pain today. No odynophagia. No nausea or vomiting. She has had some heartburn when her dysphagia symptoms returned, but she has continued on pantoprazole 40 mg twice daily.  Review of Systems As per history of present illness, otherwise negative  Current Medications, Allergies, Past Medical History, Past Surgical History, Family History and Social History were reviewed in [employer] record.     Objective:   Physical Exam BP 146/60  Pulse 68  Ht 5\' 3"  (1.6 m)  Wt 158 lb 6.4 oz (71.85 kg)  BMI 28.07 kg/m2 Constitutional: Well-developed and well-nourished. No distress. Neurological: Alert and oriented to person place and time. Psychiatric: Normal mood and  affect. Behavior is normal.  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 Successfully dilated with 54 French Maloney dilator. Exam otherwise unremarkable. Small hiatus hernia.  EGD 06/29/2011 - moderately severe Candida esophagitis, benign-appearing esophageal stricture. Dilated with TTS balloon 18 mm. Otherwise normal examination  EGD 05/12/2010 - LA grade D. reflux esophagitis, low-grade narrowing Schatzki's ring, gastritis, normal duodenum.  Colonoscopy 05/11/2009 - exam to the cecum, bowel preparation was good. Internal hemorrhoids, otherwise normal.     Assessment & Plan:  67 yo female with PMH of rheumatoid arthritis, diabetes, GERD with history of erosive esophagitis, hypertension, hyperlipidemia, Schatzki's ring, and history of candidal esophagitis who seen in followup.  1.  Dysphagia/history of esophageal candidiasis -- she developed diarrhea with Dexilant and she resumed pantoprazole 40 mg, but increase to twice daily. She then resumed fluconazole and had excellent response with resolution of her dysphagia. This argues strongly for esophageal candidiasis. Unfortunately, over the last 4-5 days her symptoms have returned, raising the question of recurrent candidiasis.  For now will treat her with another course of fluconazole 100 mg x21 days. If she is not significantly better within 5 days, I have asked that she call and notify my office. She voices understanding. I would like her to decrease pantoprazole back to once daily.  If her symptoms continue and do not respond to antifungals, she will likely need repeat EGD.  I will see her back in 3 months or sooner if necessary. I will expect to hear from her next week if not better

## 2012-09-11 NOTE — Patient Instructions (Addendum)
We have sent the following medications to your pharmacy for you to pick up at your convenience: Fluconizole; Discontinue taking Lipitor during these 21 days.  Decrease protonix to daily  Please call us if you're not feeling better by Monday  Follow up in 3 months                                               We are excited to introduce MyChart, a new best-in-class service that provides you online access to important information in your electronic medical record. We want to make it easier for you to view your health information - all in one secure location - when and where you need it. We expect MyChart will enhance the quality of care and service we provide.  When you register for MyChart, you can:    View your test results.    Request appointments and receive appointment reminders via email.    Request medication renewals.    View your medical history, allergies, medications and immunizations.    Communicate with your physician's office through a password-protected site.    Conveniently print information such as your medication lists.  To find out if MyChart is right for you, please talk to a member of our clinical staff today. We will gladly answer your questions about this free health and wellness tool.  If you are age 8 or older and want a member of your family to have access to your record, you must provide written consent by completing a proxy form available at our office. Please speak to our clinical staff about guidelines regarding accounts for patients younger than age 41.  As you activate your MyChart account and need any technical assistance, please call the MyChart technical support line at [phone]) or email your question to [email or web address]. If you email your question(s), please include your name, a return phone number and the best time to reach you.  If you have non-urgent health-related questions, you can send a message to our office  through MyChart at [email or web address]. If you have a medical emergency, call 911.  Thank you for using MyChart as your new health and wellness resource!   MyChart licensed from [employer],  [id number]. Patents Pending.

## 2012-09-18 ENCOUNTER — Encounter: Payer: Self-pay | Admitting: Adult Health

## 2012-09-18 ENCOUNTER — Ambulatory Visit (INDEPENDENT_AMBULATORY_CARE_PROVIDER_SITE_OTHER): Payer: Medicare Other | Admitting: Adult Health

## 2012-09-18 ENCOUNTER — Telehealth: Payer: Self-pay | Admitting: Internal Medicine

## 2012-09-18 VITALS — BP 130/56 | HR 71 | Temp 98.5°F | Resp 14 | Wt 157.5 lb

## 2012-09-18 DIAGNOSIS — J40 Bronchitis, not specified as acute or chronic: Secondary | ICD-10-CM

## 2012-09-18 MED ORDER — AZITHROMYCIN 250 MG PO TABS: ORAL_TABLET | ORAL | Status: DC

## 2012-09-18 NOTE — Telephone Encounter (Signed)
Patient Information:  Caller Name: Tura  Phone: [phone]  Patient: Janet, [name]  Gender: Female  DOB: 1945/07/04  Age: 66 Years  PCP: Ronna Polio (Adults only)  Office Follow Up:  Does the office need to follow up with this patient?: No  Instructions For The Office: N/A  RN Note:  Pt was requesting antibiotics be called in.  Triage advised pt of office policy that MD would rather see pt in office.  Offered to send note to MD with pt request, however she said to go ahead and make the appt.  Triage made appt for today at 10:45 AM with Orville Govern, NP.  Pt was not able to make earlier appt because of travel time to office.    Symptoms  Reason For Call & Symptoms: Pt calling today 09/18/12 regarding having cough with nasal congestion.  Said cough is mainly dry but can cough up mucus on occasion.  Mucus has been yellow.  Pt takes medication for RA that lowers her immune system.  Reviewed Health History In EMR: Yes  Reviewed Medications In EMR: Yes  Reviewed Allergies In EMR: Yes  Reviewed Surgeries / Procedures: Yes  Date of Onset of Symptoms: 09/10/2012  Guideline(s) Used:  Cough  Disposition Per Guideline:   See Today or Tomorrow in Office  Reason For Disposition Reached:   Patient wants to be seen  Advice Given:  N/A  Patient Will Follow Care Advice:  YES  Appointment Scheduled:  09/18/2012 10:45:00 Appointment Scheduled Provider:  Orville Govern

## 2012-09-18 NOTE — Patient Instructions (Addendum)
  Start your antibiotic today. You will take Azithromycin 250 mg - take 2 tablets on the first day then take 1 tablet daily for the next 4 days.  For your cough, use over the counter medications such as Robitussin or Delsym.  Use cough lozenges to help with cough as well.  Please call if you are not improved within 3-4 days.

## 2012-09-18 NOTE — Assessment & Plan Note (Signed)
Should symptoms started with sinus congestion and now with significant cough. Start azithromycin. Use over-the-counter cough medication which do not contain codeine. RTC if symptoms are not improved within 3-4 days.

## 2012-09-18 NOTE — Progress Notes (Signed)
Subjective:    Patient ID: [name], female    DOB: 11/30/1945, 67 y.o.   MRN: [id number]  HPI  Patient presents to clinic with > 1 week congested cough, productive yellow sputum, sinus pressure. Denies fever or chills; no shortness of breath. She has not tried anything OTC because "I am on so much medication". Patient is on methotrexate and embril for her RA. Last time she went to see her Rheumatologist she had blood work which showed slightly low WBC which is consistent with this medication.    Current Outpatient Prescriptions on File Prior to Visit  Medication Sig Dispense Refill  . amLODipine (NORVASC) 10 MG tablet Take 1 tablet (10 mg total) by mouth daily.  90 tablet  3  . aspirin 81 MG tablet Take 81 mg by mouth daily.        . Calcium Carbonate Antacid (TUMS PO) Take 4 tablets by mouth daily.        . Cholecalciferol (VITAMIN D) 2000 UNITS CAPS Take 1 capsule by mouth daily.      . Dihydroxyaluminum Sod Carb (ROLAIDS PO) Take by mouth as needed.        . etanercept (ENBREL) 25 MG injection Inject 25 mg into the skin once a week.      . fluconazole (DIFLUCAN) 100 MG tablet Take 1 tablet (100 mg total) by mouth daily.  21 tablet  0  . folic acid (FOLVITE) 1 MG tablet Take 1 tablet (1 mg total) by mouth daily.  90 tablet  3  . furosemide (LASIX) 40 MG tablet Take 1 tablet (40 mg total) by mouth 2 (two) times daily.  60 tablet  5  . ibuprofen (ADVIL,MOTRIN) 800 MG tablet Take 1 tablet (800 mg total) by mouth 3 (three) times daily as needed.  160 tablet  0  . insulin lispro (HUMALOG) 100 UNIT/ML injection As directed via Insulin pump  30 mL  11  . Krill Oil 300 MG CAPS Take by mouth daily.      Marland Kitchen losartan (COZAAR) 100 MG tablet Take 1 tablet (100 mg total) by mouth daily.  90 tablet  3  . methotrexate (RHEUMATREX) 2.5 MG tablet Take 4 mg by mouth once a week.       . metoprolol (LOPRESSOR) 100 MG tablet Take 1 tablet (100 mg total) by mouth 2 (two) times daily.  180 tablet  1  .  Multiple Vitamin (MULTIVITAMIN) tablet Take 1 tablet by mouth daily.        . pantoprazole (PROTONIX) 40 MG tablet Take one tablet by mouth twice daily.  60 tablet  6  . pyridOXINE (VITAMIN B-6) 100 MG tablet Take 100 mg by mouth daily.        . Triamcinolone Acetonide (TRIAMCINOLONE 0.1 % CREAM : EUCERIN) CREA Apply 1 application topically 2 (two) times daily as needed.  1 each  3  . zoledronic acid (RECLAST) 5 MG/100ML SOLN Inject 5 mg into the vein once.      Marland Kitchen atorvastatin (LIPITOR) 20 MG tablet Take 1 tablet (20 mg total) by mouth daily.  90 tablet  2   No current facility-administered medications on file prior to visit.    Review of Systems  BP 130/56  Pulse 71  Temp(Src) 98.5 F (36.9 C) (Oral)  Resp 14  Wt 157 lb 8 oz (71.442 kg)  BMI 27.91 kg/m2  SpO2 97%    Objective:   Physical Exam  Constitutional: She is oriented to person, place,  and time. She appears well-developed and well-nourished. No distress.  HENT:  Head: Normocephalic and atraumatic.  Right Ear: External ear normal.  Left Ear: External ear normal.  Pharyngeal erythema  Cardiovascular: Normal rate, regular rhythm and normal heart sounds.  Exam reveals no gallop and no friction rub.   No murmur heard. Pulmonary/Chest: Effort normal and breath sounds normal. No respiratory distress. She has no wheezes. She has no rales.  Rhonchi Left upper lobe which cleared with coughing.  Lymphadenopathy:    She has no cervical adenopathy.  Neurological: She is alert and oriented to person, place, and time. No cranial nerve deficit. Coordination normal.  Skin: Skin is warm and dry.  Psychiatric: She has a normal mood and affect. Her behavior is normal. Judgment and thought content normal.          Assessment & Plan:

## 2012-09-21 ENCOUNTER — Telehealth: Payer: Self-pay | Admitting: *Deleted

## 2012-09-21 ENCOUNTER — Inpatient Hospital Stay: Payer: Self-pay | Admitting: Internal Medicine

## 2012-09-21 LAB — TSH: Thyroid Stimulating Horm: 1.43 u[IU]/mL

## 2012-09-21 LAB — CBC
HCT: 33.6 % — ABNORMAL LOW (ref 35.0–47.0)
HGB: 11.5 g/dL — ABNORMAL LOW (ref 12.0–16.0)
MCHC: 34.2 g/dL (ref 32.0–36.0)
Platelet: 219 10*3/uL (ref 150–440)
RBC: 3.67 10*6/uL — ABNORMAL LOW (ref 3.80–5.20)
RDW: 14.3 % (ref 11.5–14.5)
WBC: 10.4 10*3/uL (ref 3.6–11.0)

## 2012-09-21 LAB — TROPONIN I
Troponin-I: 2.4 ng/mL — ABNORMAL HIGH
Troponin-I: 2.05 ng/mL — ABNORMAL HIGH

## 2012-09-21 LAB — COMPREHENSIVE METABOLIC PANEL
Albumin: 3.2 g/dL — ABNORMAL LOW (ref 3.4–5.0)
Anion Gap: 11 (ref 7–16)
BUN: 16 mg/dL (ref 7–18)
Calcium, Total: 9.2 mg/dL (ref 8.5–10.1)
Chloride: 99 mmol/L (ref 98–107)
Creatinine: 0.91 mg/dL (ref 0.60–1.30)
EGFR (African American): >60
EGFR (Non-African Amer.): >60
Glucose: 430 mg/dL — ABNORMAL HIGH (ref 65–99)
Potassium: 3.7 mmol/L (ref 3.5–5.1)
Sodium: 131 mmol/L — ABNORMAL LOW (ref 136–145)

## 2012-09-21 LAB — URINALYSIS, COMPLETE
Bacteria: NONE SEEN
Bilirubin,UR: NEGATIVE
Glucose,UR: >=500 mg/dL (ref 0–75)
Leukocyte Esterase: NEGATIVE
Ph: 5 (ref 4.5–8.0)
RBC,UR: <1 /HPF (ref 0–5)
Specific Gravity: 1.017 (ref 1.003–1.030)
Squamous Epithelial: <1
WBC UR: 1 /HPF (ref 0–5)

## 2012-09-21 LAB — PRO B NATRIURETIC PEPTIDE: B-Type Natriuretic Peptide: 8236 pg/mL — ABNORMAL HIGH (ref 0–125)

## 2012-09-21 LAB — CK TOTAL AND CKMB (NOT AT ARMC)
CK, Total: 152 U/L (ref 21–215)
CK-MB: 5.6 ng/mL — ABNORMAL HIGH (ref 0.5–3.6)

## 2012-09-21 LAB — CK: CK, Total: 136 U/L (ref 21–215)

## 2012-09-21 LAB — CK-MB: CK-MB: 4.6 ng/mL — ABNORMAL HIGH (ref 0.5–3.6)

## 2012-09-21 NOTE — Telephone Encounter (Signed)
Received a call from [place] at local urgent care. Patient was at Urgent Care, she was wheezing with SOB. They gave her a breathing treatment and she needed to go to ED but first she wanted her to come to this office first. The nurse Junious Dresser kept stating they could not tell her to go to the ED, she had to come her so we could tell her to go to the ED. Asked her to speak to patient, informed [name] she needed to go the ER right now so she could be properly treated. Patient was hesitant at first but agreed to go ASAP.

## 2012-09-22 DIAGNOSIS — R079 Chest pain, unspecified: Secondary | ICD-10-CM

## 2012-09-22 DIAGNOSIS — I214 Non-ST elevation (NSTEMI) myocardial infarction: Secondary | ICD-10-CM

## 2012-09-22 LAB — FOLATE: Folic Acid: 19.5 ng/mL (ref 3.1–100.0)

## 2012-09-22 LAB — BASIC METABOLIC PANEL
Anion Gap: 8 (ref 7–16)
BUN: 19 mg/dL — ABNORMAL HIGH (ref 7–18)
Chloride: 106 mmol/L (ref 98–107)
Glucose: 104 mg/dL — ABNORMAL HIGH (ref 65–99)
Osmolality: 278 (ref 275–301)
Potassium: 3.1 mmol/L — ABNORMAL LOW (ref 3.5–5.1)

## 2012-09-22 LAB — CBC WITH DIFFERENTIAL/PLATELET
Basophil %: 0.5 %
HCT: 26.7 % — ABNORMAL LOW (ref 35.0–47.0)
HGB: 9.5 g/dL — ABNORMAL LOW (ref 12.0–16.0)
Lymphocyte #: 0.6 10*3/uL — ABNORMAL LOW (ref 1.0–3.6)
Lymphocyte %: 6.2 %
MCH: 31.8 pg (ref 26.0–34.0)
MCHC: 35.7 g/dL (ref 32.0–36.0)
MCV: 89 fL (ref 80–100)
Monocyte #: 0.7 x10 3/mm (ref 0.2–0.9)
Neutrophil #: 8 10*3/uL — ABNORMAL HIGH (ref 1.4–6.5)
RBC: 3 10*6/uL — ABNORMAL LOW (ref 3.80–5.20)
RDW: 14.1 % (ref 11.5–14.5)
WBC: 9.3 10*3/uL (ref 3.6–11.0)

## 2012-09-22 LAB — MAGNESIUM: Magnesium: 1.7 mg/dL — ABNORMAL LOW

## 2012-09-22 LAB — FERRITIN: Ferritin (ARMC): 150 ng/mL (ref 8–388)

## 2012-09-22 LAB — IRON: Iron: 14 ug/dL — ABNORMAL LOW (ref 50–170)

## 2012-09-22 LAB — LIPID PANEL
Cholesterol: 142 mg/dL (ref 0–200)
HDL Cholesterol: 54 mg/dL (ref 40–60)
Ldl Cholesterol, Calc: 75 mg/dL (ref 0–100)
VLDL Cholesterol, Calc: 13 mg/dL (ref 5–40)

## 2012-09-22 LAB — HEMATOCRIT: HCT: 26.8 % — ABNORMAL LOW (ref 35.0–47.0)

## 2012-09-22 LAB — TROPONIN I
Troponin-I: 12 ng/mL — ABNORMAL HIGH
Troponin-I: 12 ng/mL — ABNORMAL HIGH

## 2012-09-22 LAB — CK TOTAL AND CKMB (NOT AT ARMC)
CK, Total: 327 U/L — ABNORMAL HIGH (ref 21–215)
CK, Total: 361 U/L — ABNORMAL HIGH (ref 21–215)
CK-MB: 14.1 ng/mL — ABNORMAL HIGH (ref 0.5–3.6)

## 2012-09-22 LAB — HEMOGLOBIN A1C: Hemoglobin A1C: 8.9 % — ABNORMAL HIGH (ref 4.2–6.3)

## 2012-09-23 LAB — CBC WITH DIFFERENTIAL/PLATELET
Basophil #: 0.1 10*3/uL (ref 0.0–0.1)
Basophil %: 0.9 %
HGB: 9.4 g/dL — ABNORMAL LOW (ref 12.0–16.0)
Lymphocyte #: 0.7 10*3/uL — ABNORMAL LOW (ref 1.0–3.6)
Lymphocyte %: 11.2 %
MCH: 31.4 pg (ref 26.0–34.0)
MCHC: 34.8 g/dL (ref 32.0–36.0)
MCV: 90 fL (ref 80–100)
Monocyte #: 0.7 x10 3/mm (ref 0.2–0.9)
Monocyte %: 11.5 %
Neutrophil %: 75.8 %
Platelet: 226 10*3/uL (ref 150–440)
RBC: 3 10*6/uL — ABNORMAL LOW (ref 3.80–5.20)

## 2012-09-23 LAB — BASIC METABOLIC PANEL
Anion Gap: 7 (ref 7–16)
BUN: 18 mg/dL (ref 7–18)
Calcium, Total: 8.5 mg/dL (ref 8.5–10.1)
Chloride: 107 mmol/L (ref 98–107)
Co2: 23 mmol/L (ref 21–32)
Creatinine: 0.77 mg/dL (ref 0.60–1.30)
EGFR (Non-African Amer.): >60
Potassium: 3.9 mmol/L (ref 3.5–5.1)

## 2012-09-23 LAB — OCCULT BLOOD X 1 CARD TO LAB, STOOL: Occult Blood, Feces: NEGATIVE

## 2012-09-23 LAB — CLOSTRIDIUM DIFFICILE BY PCR

## 2012-09-23 LAB — MAGNESIUM: Magnesium: 1.9 mg/dL

## 2012-09-23 LAB — TROPONIN I: Troponin-I: 5.4 ng/mL — ABNORMAL HIGH

## 2012-09-24 LAB — BASIC METABOLIC PANEL
Anion Gap: 11 (ref 7–16)
Calcium, Total: 8.6 mg/dL (ref 8.5–10.1)
Chloride: 105 mmol/L (ref 98–107)
Creatinine: 0.68 mg/dL (ref 0.60–1.30)
EGFR (African American): >60
EGFR (Non-African Amer.): >60
Glucose: 278 mg/dL — ABNORMAL HIGH (ref 65–99)
Osmolality: 285 (ref 275–301)
Potassium: 4.9 mmol/L (ref 3.5–5.1)
Sodium: 137 mmol/L (ref 136–145)

## 2012-09-24 LAB — CBC WITH DIFFERENTIAL/PLATELET
Basophil #: 0.1 10*3/uL (ref 0.0–0.1)
Basophil %: 1 %
Eosinophil #: 0.1 10*3/uL (ref 0.0–0.7)
HCT: 27.8 % — ABNORMAL LOW (ref 35.0–47.0)
HGB: 9.6 g/dL — ABNORMAL LOW (ref 12.0–16.0)
Lymphocyte %: 15.5 %
MCHC: 34.4 g/dL (ref 32.0–36.0)
Monocyte #: 0.7 x10 3/mm (ref 0.2–0.9)
Neutrophil #: 4 10*3/uL (ref 1.4–6.5)
Neutrophil %: 69.7 %
Platelet: 247 10*3/uL (ref 150–440)
RBC: 3.02 10*6/uL — ABNORMAL LOW (ref 3.80–5.20)

## 2012-09-25 LAB — CBC WITH DIFFERENTIAL/PLATELET
Basophil #: 0.1 10*3/uL (ref 0.0–0.1)
Basophil %: 0.9 %
HCT: 27.3 % — ABNORMAL LOW (ref 35.0–47.0)
Lymphocyte %: 17.6 %
MCHC: 34.7 g/dL (ref 32.0–36.0)
Monocyte %: 14.7 %
Neutrophil %: 64.6 %
Platelet: 256 10*3/uL (ref 150–440)
RBC: 3.01 10*6/uL — ABNORMAL LOW (ref 3.80–5.20)
WBC: 5.7 10*3/uL (ref 3.6–11.0)

## 2012-09-26 DIAGNOSIS — I214 Non-ST elevation (NSTEMI) myocardial infarction: Secondary | ICD-10-CM

## 2012-09-26 LAB — CBC WITH DIFFERENTIAL/PLATELET
Basophil #: 0.1 10*3/uL (ref 0.0–0.1)
Basophil %: 1 %
Eosinophil #: 0.2 10*3/uL (ref 0.0–0.7)
Eosinophil %: 3.1 %
HCT: 27.5 % — ABNORMAL LOW (ref 35.0–47.0)
MCHC: 33.8 g/dL (ref 32.0–36.0)
MCV: 90 fL (ref 80–100)
Monocyte %: 13 %
Neutrophil #: 3.3 10*3/uL (ref 1.4–6.5)
Neutrophil %: 65 %
Platelet: 289 10*3/uL (ref 150–440)
RDW: 14.4 % (ref 11.5–14.5)
WBC: 5.1 10*3/uL (ref 3.6–11.0)

## 2012-09-26 LAB — BASIC METABOLIC PANEL
Anion Gap: 7 (ref 7–16)
Chloride: 102 mmol/L (ref 98–107)
Co2: 26 mmol/L (ref 21–32)
EGFR (African American): >60
Glucose: 263 mg/dL — ABNORMAL HIGH (ref 65–99)
Osmolality: 277 (ref 275–301)
Potassium: 4.2 mmol/L (ref 3.5–5.1)
Sodium: 135 mmol/L — ABNORMAL LOW (ref 136–145)

## 2012-09-27 ENCOUNTER — Telehealth: Payer: Self-pay | Admitting: Internal Medicine

## 2012-09-27 ENCOUNTER — Telehealth: Payer: Self-pay

## 2012-09-27 LAB — BASIC METABOLIC PANEL
Anion Gap: 6 — ABNORMAL LOW (ref 7–16)
BUN: 8 mg/dL (ref 7–18)
Calcium, Total: 8.5 mg/dL (ref 8.5–10.1)
Chloride: 100 mmol/L (ref 98–107)
Co2: 28 mmol/L (ref 21–32)
Creatinine: 0.6 mg/dL (ref 0.60–1.30)
Osmolality: 278 (ref 275–301)
Potassium: 4.5 mmol/L (ref 3.5–5.1)

## 2012-09-27 NOTE — Telephone Encounter (Signed)
[facility] hospital follow up 10/04/12 with Raquel  Pt discharged 5/29  [facility] stated they will fax discharged paperwork

## 2012-09-27 NOTE — Telephone Encounter (Signed)
Message copied by Marcelle Overlie on Thu Sep 27, 2012 12:15 PM ------      Message from: Coralee Rud      Created: Thu Sep 27, 2012 12:02 PM      Regarding: tcm/ph       Dr Mariah Milling 10/01/12 at 2:00 ------

## 2012-09-27 NOTE — Telephone Encounter (Signed)
tcm

## 2012-09-27 NOTE — Telephone Encounter (Signed)
D/c 5/29 Will attempt TCM #1 5/30

## 2012-09-28 NOTE — Telephone Encounter (Signed)
Spoke with patient, she is doing better. They discharged her with some abx.

## 2012-09-28 NOTE — Telephone Encounter (Signed)
Patient contacted regarding discharge from [facility] on 09/27/12.  Patient understands to follow up with provider Dr. Mariah Milling on 10/01/12 at 2:00 pm  at [facility] office. Patient understands discharge instructions? yes Patient understands medications and regiment? yes Patient understands to bring all medications to this visit? yes  Pt denies CP, worsening sob Says she is "feeling better" Will call us should she need [place] sooner

## 2012-10-01 ENCOUNTER — Encounter: Payer: Self-pay | Admitting: Cardiovascular Disease

## 2012-10-01 ENCOUNTER — Ambulatory Visit (INDEPENDENT_AMBULATORY_CARE_PROVIDER_SITE_OTHER): Payer: Medicare Other | Admitting: Cardiovascular Disease

## 2012-10-01 VITALS — BP 125/70 | HR 73 | Ht 64.0 in | Wt 159.0 lb

## 2012-10-01 DIAGNOSIS — I2510 Atherosclerotic heart disease of native coronary artery without angina pectoris: Secondary | ICD-10-CM

## 2012-10-01 DIAGNOSIS — R0602 Shortness of breath: Secondary | ICD-10-CM

## 2012-10-01 DIAGNOSIS — J40 Bronchitis, not specified as acute or chronic: Secondary | ICD-10-CM

## 2012-10-01 DIAGNOSIS — E109 Type 1 diabetes mellitus without complications: Secondary | ICD-10-CM

## 2012-10-01 DIAGNOSIS — I10 Essential (primary) hypertension: Secondary | ICD-10-CM

## 2012-10-01 DIAGNOSIS — E785 Hyperlipidemia, unspecified: Secondary | ICD-10-CM

## 2012-10-01 NOTE — Assessment & Plan Note (Signed)
Symptoms more concerning for pneumonia given the severity of her symptoms. She only has one more day of Levaquin. Followup with primary care in several days' time.

## 2012-10-01 NOTE — Assessment & Plan Note (Addendum)
She would prefer to wait on her cardiac catheterization and will call our office when she would like this to be scheduled. She continues to have cough and difficulty lying flat. Suspect underlying CAD given long history of diabetes, climb in her troponin to 12 on recent hospital admission. Catheterization was not performed as she was unable to lie flat.

## 2012-10-01 NOTE — Progress Notes (Signed)
Patient ID: [name], female    DOB: 1945/12/09, 67 y.o.   MRN: [id number]  HPI Comments: Ms. 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 She had spiking fevers, chills, severe shortness of breath. Sugars were in the 400s. She had a long course of antibiotics to her hospital visit. Secondary to significant cough, she was unable to lie flat for cardiac catheterization. She declined cardiac catheterization on the same hospital visit and she presents for followup today. On admission to the hospital, her immunosuppressants, Enbrel and methotrexate were held  In followup, she reports that she is slowly getting stronger, continues to have a cough, unable to lie flat for prolonged periods of time. She would again like to delay cardiac catheterization until she feels better. She denies any chest pain. Cough is less productive in the morning in the late evening.  Chest x-rays in the hospital suggested pneumonia, possibly by basilar Echocardiogram 8/24 2014 showed normal ejection fraction with no wall motion abnormality, normal right ventricular size and function, moderately elevated right ventricular systolic pressures.  EKG shows normal sinus rhythm with nonspecific ST abnormality in the anterolateral leads, inferior leads   Outpatient Encounter Prescriptions as of 10/01/2012  Medication Sig Dispense Refill  . amLODipine (NORVASC) 10 MG tablet Take 1 tablet (10 mg total) by mouth daily.  90 tablet  3  . aspirin 81 MG tablet Take 81 mg by mouth daily.        Marland Kitchen atorvastatin (LIPITOR) 20 MG tablet Take 1 tablet (20 mg total) by mouth daily.  90 tablet  2  . Calcium Carbonate Antacid (TUMS PO) Take 4 tablets by mouth daily.        . Cholecalciferol (VITAMIN D) 2000 UNITS CAPS Take 1  capsule by mouth daily.      Marland Kitchen Dextromethorphan-Guaifenesin (MUCINEX DM PO) Take by mouth 2 (two) times daily.      . Dihydroxyaluminum Sod Carb (ROLAIDS PO) Take by mouth as needed.        . etanercept (ENBREL) 25 MG injection Inject 25 mg into the skin once a week.      . fluconazole (DIFLUCAN) 100 MG tablet Take 1 tablet (100 mg total) by mouth daily.  21 tablet  0  . folic acid (FOLVITE) 1 MG tablet Take 1 tablet (1 mg total) by mouth daily.  90 tablet  3  . furosemide (LASIX) 40 MG tablet Take 1 tablet (40 mg total) by mouth 2 (two) times daily.  60 tablet  5  . ibuprofen (ADVIL,MOTRIN) 800 MG tablet Take 1 tablet (800 mg total) by mouth 3 (three) times daily as needed.  160 tablet  0  . insulin lispro (HUMALOG) 100 UNIT/ML injection As directed via Insulin pump  30 mL  11  . Krill Oil 300 MG CAPS Take by mouth daily.      Marland Kitchen levofloxacin (LEVAQUIN) 500 MG tablet Take by mouth daily.       Marland Kitchen losartan (COZAAR) 100 MG tablet Take 1 tablet (100 mg total) by mouth daily.  90 tablet  3  . methotrexate (RHEUMATREX) 2.5 MG tablet Take 4 mg by mouth once a week.       . metoprolol (LOPRESSOR) 100 MG tablet Take 1 tablet (100 mg total) by mouth 2 (two) times daily.  180  tablet  1  . Multiple Vitamin (MULTIVITAMIN) tablet Take 1 tablet by mouth daily.        Marland Kitchen NITROSTAT 0.4 MG SL tablet 0.4 mg every 5 (five) minutes as needed.       . pantoprazole (PROTONIX) 40 MG tablet Take one tablet by mouth twice daily.  60 tablet  6  . pyridOXINE (VITAMIN B-6) 100 MG tablet Take 100 mg by mouth daily.        . Triamcinolone Acetonide (TRIAMCINOLONE 0.1 % CREAM : EUCERIN) CREA Apply 1 application topically 2 (two) times daily as needed.  1 each  3  . zoledronic acid (RECLAST) 5 MG/100ML SOLN Inject 5 mg into the vein once.      . [DISCONTINUED] azithromycin (ZITHROMAX) 250 MG tablet Take 2 tablets today then 1 tablet daily for the next 4 days.  6 tablet  0   No facility-administered encounter medications on file  as of 10/01/2012.    Review of Systems  Constitutional: Negative.   HENT: Negative.   Eyes: Negative.   Respiratory: Positive for cough and shortness of breath.   Cardiovascular: Negative.   Gastrointestinal: Negative.   Musculoskeletal: Negative.   Skin: Negative.   Neurological: Negative.   Psychiatric/Behavioral: Negative.   All other systems reviewed and are negative.    BP 125/70  Pulse 73  Ht 5\' 4"  (1.626 m)  Wt 159 lb (72.122 kg)  BMI 27.28 kg/m2  Physical Exam  Nursing note and vitals reviewed. Constitutional: She is oriented to person, place, and time. She appears well-developed and well-nourished.  HENT:  Head: Normocephalic.  Nose: Nose normal.  Mouth/Throat: Oropharynx is clear and moist.  Eyes: Conjunctivae are normal. Pupils are equal, round, and reactive to light.  Neck: Normal range of motion. Neck supple. No JVD present.  Cardiovascular: Normal rate, regular rhythm, S1 normal, S2 normal, normal heart sounds and intact distal pulses.  Exam reveals no gallop and no friction rub.   No murmur heard. Pulmonary/Chest: Effort normal and breath sounds normal. No respiratory distress. She has no wheezes. She has no rales. She exhibits no tenderness.  Abdominal: Soft. Bowel sounds are normal. She exhibits no distension. There is no tenderness.  Musculoskeletal: Normal range of motion. She exhibits no edema and no tenderness.  Lymphadenopathy:    She has no cervical adenopathy.  Neurological: She is alert and oriented to person, place, and time. Coordination normal.  Skin: Skin is warm and dry. No rash noted. No erythema.  Psychiatric: She has a normal mood and affect. Her behavior is normal. Judgment and thought content normal.    Assessment and Plan

## 2012-10-01 NOTE — Assessment & Plan Note (Signed)
Blood pressure is well controlled on today's visit. No changes made to the medications. 

## 2012-10-01 NOTE — Assessment & Plan Note (Signed)
She is closely watching her sugars. Was very high in the hospital on arrival several weeks ago

## 2012-10-01 NOTE — Assessment & Plan Note (Signed)
Suggested that she stay on her statin

## 2012-10-01 NOTE — Patient Instructions (Addendum)
You are doing well. No medication changes were made.  Please call us if you have new issues that need to be addressed before your next appt.  Your physician wants you to follow-up in: 1 month.  

## 2012-10-02 NOTE — Telephone Encounter (Signed)
We can move it if she would like. She needs 

## 2012-10-02 NOTE — Telephone Encounter (Signed)
Offered patient an appointment to see Dr. Dan Humphreys on Friday, she declined. Will keep her appointment with Raquel.

## 2012-10-02 NOTE — Telephone Encounter (Signed)
She thought her follow up appointment was scheduled with you.

## 2012-10-04 ENCOUNTER — Ambulatory Visit: Payer: Medicare Other | Admitting: Adult Health

## 2012-10-05 ENCOUNTER — Ambulatory Visit: Payer: Medicare Other | Admitting: Adult Health

## 2012-10-09 ENCOUNTER — Telehealth: Payer: Self-pay

## 2012-10-09 ENCOUNTER — Ambulatory Visit (INDEPENDENT_AMBULATORY_CARE_PROVIDER_SITE_OTHER): Payer: Medicare Other | Admitting: Internal Medicine

## 2012-10-09 ENCOUNTER — Encounter: Payer: Self-pay | Admitting: Internal Medicine

## 2012-10-09 VITALS — BP 110/60 | HR 60 | Temp 98.2°F | Wt 158.0 lb

## 2012-10-09 DIAGNOSIS — I2510 Atherosclerotic heart disease of native coronary artery without angina pectoris: Secondary | ICD-10-CM

## 2012-10-09 DIAGNOSIS — J189 Pneumonia, unspecified organism: Secondary | ICD-10-CM

## 2012-10-09 DIAGNOSIS — E109 Type 1 diabetes mellitus without complications: Secondary | ICD-10-CM

## 2012-10-09 DIAGNOSIS — D509 Iron deficiency anemia, unspecified: Secondary | ICD-10-CM

## 2012-10-09 LAB — CBC WITH DIFFERENTIAL/PLATELET
Basophils Absolute: 0.1 10*3/uL (ref 0.0–0.1)
Eosinophils Absolute: 0.1 10*3/uL (ref 0.0–0.7)
HCT: 35.3 % — ABNORMAL LOW (ref 36.0–46.0)
Lymphs Abs: 1.3 10*3/uL (ref 0.7–4.0)
MCHC: 33.6 g/dL (ref 30.0–36.0)
MCV: 92.8 fl (ref 78.0–100.0)
Monocytes Absolute: 0.5 10*3/uL (ref 0.1–1.0)
Neutrophils Relative %: 54.4 % (ref 43.0–77.0)
Platelets: 363 10*3/uL (ref 150.0–400.0)
RDW: 15.9 % — ABNORMAL HIGH (ref 11.5–14.6)

## 2012-10-09 LAB — COMPREHENSIVE METABOLIC PANEL
ALT: 23 U/L (ref 0–35)
Albumin: 3.7 g/dL (ref 3.5–5.2)
Alkaline Phosphatase: 54 U/L (ref 39–117)
CO2: 26 mEq/L (ref 19–32)
GFR: 103.42 mL/min (ref >60.00)
Glucose, Bld: 156 mg/dL — ABNORMAL HIGH (ref 70–99)
Potassium: 4.5 mEq/L (ref 3.5–5.1)
Sodium: 134 mEq/L — ABNORMAL LOW (ref 135–145)
Total Protein: 7.9 g/dL (ref 6.0–8.3)

## 2012-10-09 LAB — HEMOGLOBIN A1C: Hgb A1c MFr Bld: 8.5 % — ABNORMAL HIGH (ref 4.6–6.5)

## 2012-10-09 NOTE — Telephone Encounter (Signed)
Unable to reach scheduling dept at [facility] Will try again later

## 2012-10-09 NOTE — Telephone Encounter (Signed)
Pt called to schedule [facility] as discussed at last OV. Would like to schedule with Dr. Mariah Milling 6/27. We offered 6/13 but she declined I will call to schedule for 6/27 and call her back with details.

## 2012-10-09 NOTE — Assessment & Plan Note (Signed)
Recent hospitalization with community-acquired pneumonia. Completed course of Levaquin. Exam is normal today. Symptomatically doing well. We'll continue to monitor.

## 2012-10-09 NOTE — Progress Notes (Signed)
Subjective:    Patient ID: [name], female    DOB: 12/15/1945, 67 y.o.   MRN: [id number]  HPI 67 year old female with history of diabetes, hypertension, hyperlipidemia presents for followup after recent hospitalization. She was hospitalized on 09/22/2012 for shortness of breath and found to have bilateral pneumonia and elevated troponin initially at 2 which increased to a peak of 12 during her hospitalization. Because of shortness of breath she was felt not to be a good candidate for cardiac catheterization. She did not have any chest pain during her admission nor has she had any chest pain after her admission. She reports that symptoms of shortness of breath, cough have improved. She has completed antibiotic course with Levaquin. She has completed follow up with cardiology and is planning to schedule cardiac catheterization as an outpatient. She is also followed up with endocrinology and some adjustments have been made in her insulin dosing to her insulin pump with improvement in blood sugar readings. She denies any new concerns today.  Outpatient Encounter Prescriptions as of 10/09/2012  Medication Sig Dispense Refill  . amLODipine (NORVASC) 10 MG tablet Take 1 tablet (10 mg total) by mouth daily.  90 tablet  3  . aspirin 81 MG tablet Take 81 mg by mouth daily.        Marland Kitchen atorvastatin (LIPITOR) 20 MG tablet Take 1 tablet (20 mg total) by mouth daily.  90 tablet  2  . Calcium Carbonate Antacid (TUMS PO) Take 4 tablets by mouth daily.        . Cholecalciferol (VITAMIN D) 2000 UNITS CAPS Take 1 capsule by mouth daily.      Marland Kitchen Dextromethorphan-Guaifenesin (MUCINEX DM PO) Take by mouth 2 (two) times daily.      Marland Kitchen etanercept (ENBREL) 25 MG injection Inject 25 mg into the skin once a week.      . folic acid (FOLVITE) 1 MG tablet Take 1 tablet (1 mg total) by mouth daily.  90 tablet  3  . furosemide (LASIX) 40 MG tablet Take 1 tablet (40 mg total) by mouth 2 (two) times daily.  60 tablet  5  .  ibuprofen (ADVIL,MOTRIN) 800 MG tablet Take 1 tablet (800 mg total) by mouth 3 (three) times daily as needed.  160 tablet  0  . insulin lispro (HUMALOG) 100 UNIT/ML injection As directed via Insulin pump  30 mL  11  . iron polysaccharides (NIFEREX) 150 MG capsule Take 150 mg by mouth 2 (two) times daily.      Boris Lown Oil 300 MG CAPS Take by mouth daily.      Marland Kitchen losartan (COZAAR) 100 MG tablet Take 1 tablet (100 mg total) by mouth daily.  90 tablet  3  . methotrexate (RHEUMATREX) 2.5 MG tablet Take 4 mg by mouth once a week.       . metoprolol (LOPRESSOR) 100 MG tablet Take 1 tablet (100 mg total) by mouth 2 (two) times daily.  180 tablet  1  . Multiple Vitamin (MULTIVITAMIN) tablet Take 1 tablet by mouth daily.        Marland Kitchen NITROSTAT 0.4 MG SL tablet 0.4 mg every 5 (five) minutes as needed.       . pantoprazole (PROTONIX) 40 MG tablet Take one tablet by mouth twice daily.  60 tablet  6  . pyridOXINE (VITAMIN B-6) 100 MG tablet Take 100 mg by mouth daily.        . zoledronic acid (RECLAST) 5 MG/100ML SOLN Inject 5 mg into the  vein once.      . Dihydroxyaluminum Sod Carb (ROLAIDS PO) Take by mouth as needed.        . Triamcinolone Acetonide (TRIAMCINOLONE 0.1 % CREAM : EUCERIN) CREA Apply 1 application topically 2 (two) times daily as needed.  1 each  3   No facility-administered encounter medications on file as of 10/09/2012.   BP 110/60  Pulse 60  Temp(Src) 98.2 F (36.8 C) (Oral)  Wt 158 lb (71.668 kg)  BMI 27.11 kg/m2  SpO2 94%  Review of Systems  Constitutional: Negative for fever, chills, appetite change, fatigue and unexpected weight change.  HENT: Negative for ear pain, congestion, sore throat, trouble swallowing, neck pain, voice change and sinus pressure.   Eyes: Negative for visual disturbance.  Respiratory: Negative for cough, shortness of breath, wheezing and stridor.   Cardiovascular: Negative for chest pain, palpitations and leg swelling.  Gastrointestinal: Negative for nausea,  vomiting, abdominal pain, diarrhea, constipation, blood in stool, abdominal distention and anal bleeding.  Genitourinary: Negative for dysuria and flank pain.  Musculoskeletal: Negative for myalgias, arthralgias and gait problem.  Skin: Negative for color change and rash.  Neurological: Negative for dizziness and headaches.  Hematological: Negative for adenopathy. Does not bruise/bleed easily.  Psychiatric/Behavioral: Negative for suicidal ideas, sleep disturbance and dysphoric mood. The patient is not nervous/anxious.        Objective:   Physical Exam  Constitutional: She is oriented to person, place, and time. She appears well-developed and well-nourished. No distress.  HENT:  Head: Normocephalic and atraumatic.  Right Ear: External ear normal.  Left Ear: External ear normal.  Nose: Nose normal.  Mouth/Throat: Oropharynx is clear and moist. No oropharyngeal exudate.  Eyes: Conjunctivae are normal. Pupils are equal, round, and reactive to light. Right eye exhibits no discharge. Left eye exhibits no discharge. No scleral icterus.  Neck: Normal range of motion. Neck supple. No tracheal deviation present. No thyromegaly present.  Cardiovascular: Normal rate, regular rhythm, normal heart sounds and intact distal pulses.  Exam reveals no gallop and no friction rub.   No murmur heard. Pulmonary/Chest: Effort normal and breath sounds normal. No accessory muscle usage. Not tachypneic. No respiratory distress. She has no decreased breath sounds. She has no wheezes. She has no rhonchi. She has no rales. She exhibits no tenderness.  Musculoskeletal: Normal range of motion. She exhibits no edema and no tenderness.  Lymphadenopathy:    She has no cervical adenopathy.  Neurological: She is alert and oriented to person, place, and time. No cranial nerve deficit. She exhibits normal muscle tone. Coordination normal.  Skin: Skin is warm and dry. No rash noted. She is not diaphoretic. No erythema. No  pallor.  Psychiatric: She has a normal mood and affect. Her behavior is normal. Judgment and thought content normal.          Assessment & Plan:

## 2012-10-09 NOTE — Assessment & Plan Note (Signed)
Continue insulin pump. Continue followup with endocrinology. Will check A1c with labs today.

## 2012-10-09 NOTE — Assessment & Plan Note (Signed)
Patient reports iron deficiency anemia was noted during her hospitalization. Will check CBC and ferritin with labs today.

## 2012-10-09 NOTE — Assessment & Plan Note (Signed)
Currently asymptomatic. Reviewed records from hospitalization showing non-ST elevation MI. Patient will call her cardiologist to schedule cardiac catheterization in the next couple of weeks.

## 2012-10-10 ENCOUNTER — Telehealth: Payer: Self-pay

## 2012-10-10 NOTE — Telephone Encounter (Signed)
Scheduled pt for 6/27 at 0730 Verbal instructions given to pt; will also mail instructions to pt Understanding verb

## 2012-10-10 NOTE — Telephone Encounter (Signed)
Per Dr. Mariah Milling, he would like pt r/s to 6/19 at 0730 R/s cath Pt informed Understanding verb

## 2012-10-16 ENCOUNTER — Ambulatory Visit: Payer: Medicare Other | Admitting: Internal Medicine

## 2012-10-18 ENCOUNTER — Inpatient Hospital Stay (HOSPITAL_COMMUNITY): Payer: Medicare Other

## 2012-10-18 ENCOUNTER — Other Ambulatory Visit: Payer: Self-pay

## 2012-10-18 ENCOUNTER — Encounter: Payer: Self-pay | Admitting: Cardiovascular Disease

## 2012-10-18 ENCOUNTER — Ambulatory Visit: Payer: Self-pay | Admitting: Cardiovascular Disease

## 2012-10-18 ENCOUNTER — Encounter (HOSPITAL_COMMUNITY): Payer: Self-pay | Admitting: Nurse Practitioner

## 2012-10-18 ENCOUNTER — Inpatient Hospital Stay (HOSPITAL_COMMUNITY)
Admission: AD | Admit: 2012-10-18 | Discharge: 2012-10-23 | DRG: 236 | Disposition: A | Discharge status: Home / Self Care | Payer: Medicare Other | Source: Other Acute Inpatient Hospital | Attending: Thoracic Surgery (Cardiothoracic Vascular Surgery) | Admitting: Thoracic Surgery (Cardiothoracic Vascular Surgery)

## 2012-10-18 DIAGNOSIS — R112 Nausea with vomiting, unspecified: Secondary | ICD-10-CM | POA: Diagnosis not present

## 2012-10-18 DIAGNOSIS — K219 Gastro-esophageal reflux disease without esophagitis: Secondary | ICD-10-CM | POA: Diagnosis present

## 2012-10-18 DIAGNOSIS — Z794 Long term (current) use of insulin: Secondary | ICD-10-CM

## 2012-10-18 DIAGNOSIS — Z87891 Personal history of nicotine dependence: Secondary | ICD-10-CM

## 2012-10-18 DIAGNOSIS — E876 Hypokalemia: Secondary | ICD-10-CM | POA: Diagnosis not present

## 2012-10-18 DIAGNOSIS — Z8261 Family history of arthritis: Secondary | ICD-10-CM

## 2012-10-18 DIAGNOSIS — J9819 Other pulmonary collapse: Secondary | ICD-10-CM | POA: Diagnosis not present

## 2012-10-18 DIAGNOSIS — I2510 Atherosclerotic heart disease of native coronary artery without angina pectoris: Secondary | ICD-10-CM

## 2012-10-18 DIAGNOSIS — I509 Heart failure, unspecified: Secondary | ICD-10-CM | POA: Diagnosis present

## 2012-10-18 DIAGNOSIS — J988 Other specified respiratory disorders: Secondary | ICD-10-CM | POA: Diagnosis not present

## 2012-10-18 DIAGNOSIS — M069 Rheumatoid arthritis, unspecified: Secondary | ICD-10-CM | POA: Diagnosis present

## 2012-10-18 DIAGNOSIS — Z7982 Long term (current) use of aspirin: Secondary | ICD-10-CM

## 2012-10-18 DIAGNOSIS — Z9641 Presence of insulin pump (external) (internal): Secondary | ICD-10-CM

## 2012-10-18 DIAGNOSIS — Z79899 Other long term (current) drug therapy: Secondary | ICD-10-CM

## 2012-10-18 DIAGNOSIS — I5032 Chronic diastolic (congestive) heart failure: Secondary | ICD-10-CM | POA: Diagnosis present

## 2012-10-18 DIAGNOSIS — I214 Non-ST elevation (NSTEMI) myocardial infarction: Secondary | ICD-10-CM | POA: Diagnosis present

## 2012-10-18 DIAGNOSIS — E109 Type 1 diabetes mellitus without complications: Secondary | ICD-10-CM | POA: Diagnosis present

## 2012-10-18 DIAGNOSIS — E785 Hyperlipidemia, unspecified: Secondary | ICD-10-CM | POA: Diagnosis present

## 2012-10-18 DIAGNOSIS — J90 Pleural effusion, not elsewhere classified: Secondary | ICD-10-CM | POA: Diagnosis not present

## 2012-10-18 DIAGNOSIS — Z9849 Cataract extraction status, unspecified eye: Secondary | ICD-10-CM

## 2012-10-18 DIAGNOSIS — I10 Essential (primary) hypertension: Secondary | ICD-10-CM | POA: Diagnosis present

## 2012-10-18 DIAGNOSIS — E8779 Other fluid overload: Secondary | ICD-10-CM | POA: Diagnosis not present

## 2012-10-18 DIAGNOSIS — I252 Old myocardial infarction: Secondary | ICD-10-CM

## 2012-10-18 DIAGNOSIS — Z833 Family history of diabetes mellitus: Secondary | ICD-10-CM

## 2012-10-18 DIAGNOSIS — D696 Thrombocytopenia, unspecified: Secondary | ICD-10-CM | POA: Diagnosis not present

## 2012-10-18 DIAGNOSIS — Z961 Presence of intraocular lens: Secondary | ICD-10-CM

## 2012-10-18 DIAGNOSIS — D62 Acute posthemorrhagic anemia: Secondary | ICD-10-CM | POA: Diagnosis not present

## 2012-10-18 HISTORY — DX: Atherosclerotic heart disease of native coronary artery without angina pectoris: I25.10

## 2012-10-18 HISTORY — DX: Shortness of breath: R06.02

## 2012-10-18 HISTORY — PX: CARDIAC CATHETERIZATION: SHX172

## 2012-10-18 HISTORY — DX: Pneumonia, unspecified organism: J18.9

## 2012-10-18 HISTORY — DX: Personal history of other diseases of the digestive system: Z87.19

## 2012-10-18 HISTORY — DX: Type 1 diabetes mellitus without complications: E10.9

## 2012-10-18 LAB — URINALYSIS, ROUTINE W REFLEX MICROSCOPIC
Bilirubin Urine: NEGATIVE
Hgb urine dipstick: NEGATIVE
Specific Gravity, Urine: 1.02 (ref 1.005–1.030)
Urobilinogen, UA: 0.2 mg/dL (ref 0.0–1.0)

## 2012-10-18 LAB — HEPARIN LEVEL (UNFRACTIONATED): Heparin Unfractionated: 0.22 IU/mL — ABNORMAL LOW (ref 0.30–0.70)

## 2012-10-18 LAB — URINE MICROSCOPIC-ADD ON

## 2012-10-18 LAB — GLUCOSE, CAPILLARY
Glucose-Capillary: 278 mg/dL — ABNORMAL HIGH (ref 70–99)
Glucose-Capillary: 118 mg/dL — ABNORMAL HIGH (ref 70–99)

## 2012-10-18 LAB — CBC
MCV: 88.9 fL (ref 78.0–100.0)
Platelets: 191 10*3/uL (ref 150–400)
RDW: 14.5 % (ref 11.5–15.5)
WBC: 3.6 10*3/uL — ABNORMAL LOW (ref 4.0–10.5)

## 2012-10-18 LAB — BASIC METABOLIC PANEL
Chloride: 102 mEq/L (ref 96–112)
Creatinine, Ser: 0.6 mg/dL (ref 0.50–1.10)
GFR calc non Af Amer: >90 mL/min (ref >90)

## 2012-10-18 MED ORDER — VANCOMYCIN HCL 10 G IV SOLR
1250.0000 mg | INTRAVENOUS | Status: AC
  Administered 2012-10-19: 1250 mg via INTRAVENOUS
  Filled 2012-10-18: qty 1250

## 2012-10-18 MED ORDER — ONDANSETRON HCL 4 MG/2ML IJ SOLN: 4.0000 mg | Freq: Four times a day (QID) | INTRAMUSCULAR | Status: DC | PRN

## 2012-10-18 MED ORDER — VITAMIN D3 25 MCG (1000 UNIT) PO TABS
2000.0000 [IU] | ORAL_TABLET | Freq: Every day | ORAL | Status: DC
  Filled 2012-10-18: qty 2

## 2012-10-18 MED ORDER — POTASSIUM CHLORIDE 2 MEQ/ML IV SOLN
80.0000 meq | INTRAVENOUS | Status: DC
  Filled 2012-10-18: qty 40

## 2012-10-18 MED ORDER — NITROGLYCERIN 0.4 MG SL SUBL: 0.4000 mg | SUBLINGUAL_TABLET | SUBLINGUAL | Status: DC | PRN

## 2012-10-18 MED ORDER — HEPARIN (PORCINE) IN NACL 100-0.45 UNIT/ML-% IJ SOLN
1000.0000 [IU]/h | INTRAMUSCULAR | Status: DC
  Administered 2012-10-18: 850 [IU]/h via INTRAVENOUS
  Filled 2012-10-18: qty 250

## 2012-10-18 MED ORDER — DOPAMINE-DEXTROSE 3.2-5 MG/ML-% IV SOLN
2.0000 ug/kg/min | INTRAVENOUS | Status: DC
  Filled 2012-10-18: qty 250

## 2012-10-18 MED ORDER — SODIUM CHLORIDE 0.9 % IV SOLN
INTRAVENOUS | Status: DC
  Filled 2012-10-18: qty 30

## 2012-10-18 MED ORDER — MAGNESIUM SULFATE 50 % IJ SOLN
40.0000 meq | INTRAMUSCULAR | Status: DC
  Filled 2012-10-18: qty 10

## 2012-10-18 MED ORDER — ASPIRIN 81 MG PO CHEW
81.0000 mg | CHEWABLE_TABLET | Freq: Every day | ORAL | Status: DC
  Filled 2012-10-18: qty 1

## 2012-10-18 MED ORDER — NITROGLYCERIN IN D5W 200-5 MCG/ML-% IV SOLN
2.0000 ug/min | INTRAVENOUS | Status: AC
  Administered 2012-10-19: 16.67 ug/min via INTRAVENOUS
  Filled 2012-10-18: qty 250

## 2012-10-18 MED ORDER — PHENYLEPHRINE HCL 10 MG/ML IJ SOLN
30.0000 ug/min | INTRAMUSCULAR | Status: AC
  Administered 2012-10-19: 10 ug/min via INTRAVENOUS
  Filled 2012-10-18: qty 2

## 2012-10-18 MED ORDER — SODIUM CHLORIDE 0.9 % IV SOLN
INTRAVENOUS | Status: AC
  Administered 2012-10-19: 69.8 mL/h via INTRAVENOUS
  Filled 2012-10-18: qty 40

## 2012-10-18 MED ORDER — SODIUM CHLORIDE 0.9 % IJ SOLN: 3.0000 mL | INTRAMUSCULAR | Status: DC | PRN

## 2012-10-18 MED ORDER — ALPRAZOLAM 0.25 MG PO TABS: 0.2500 mg | ORAL_TABLET | ORAL | Status: DC | PRN

## 2012-10-18 MED ORDER — ALBUTEROL SULFATE (5 MG/ML) 0.5% IN NEBU
2.5000 mg | INHALATION_SOLUTION | Freq: Once | RESPIRATORY_TRACT | Status: AC
  Administered 2012-10-18: 2.5 mg via RESPIRATORY_TRACT

## 2012-10-18 MED ORDER — SODIUM CHLORIDE 0.9 % IJ SOLN
3.0000 mL | Freq: Two times a day (BID) | INTRAMUSCULAR | Status: DC
  Administered 2012-10-18 (×2): 3 mL via INTRAVENOUS

## 2012-10-18 MED ORDER — VITAMIN D 50 MCG (2000 UT) PO CAPS: 1.0000 | ORAL_CAPSULE | Freq: Every day | ORAL | Status: DC

## 2012-10-18 MED ORDER — DIAZEPAM 2 MG PO TABS
2.0000 mg | ORAL_TABLET | Freq: Once | ORAL | Status: AC
  Administered 2012-10-19: 2 mg via ORAL
  Filled 2012-10-18: qty 1

## 2012-10-18 MED ORDER — SODIUM CHLORIDE 0.9 % IV SOLN
250.0000 mL | INTRAVENOUS | Status: DC | PRN
  Administered 2012-10-18: 250 mL via INTRAVENOUS

## 2012-10-18 MED ORDER — DEXTROSE 5 % IV SOLN
1.5000 g | INTRAVENOUS | Status: AC
  Administered 2012-10-19: 1.5 g via INTRAVENOUS
  Administered 2012-10-19: .75 g via INTRAVENOUS
  Filled 2012-10-18: qty 1.5

## 2012-10-18 MED ORDER — POLYSACCHARIDE IRON COMPLEX 150 MG PO CAPS
150.0000 mg | ORAL_CAPSULE | Freq: Two times a day (BID) | ORAL | Status: DC
  Administered 2012-10-18: 150 mg via ORAL
  Filled 2012-10-18 (×3): qty 1

## 2012-10-18 MED ORDER — VITAMIN B-6 100 MG PO TABS
100.0000 mg | ORAL_TABLET | Freq: Every day | ORAL | Status: DC
  Filled 2012-10-18: qty 1

## 2012-10-18 MED ORDER — ACETAMINOPHEN 325 MG PO TABS: 650.0000 mg | ORAL_TABLET | ORAL | Status: DC | PRN

## 2012-10-18 MED ORDER — LOSARTAN POTASSIUM 50 MG PO TABS
100.0000 mg | ORAL_TABLET | Freq: Every day | ORAL | Status: DC
  Filled 2012-10-18: qty 2

## 2012-10-18 MED ORDER — METOPROLOL TARTRATE 100 MG PO TABS
100.0000 mg | ORAL_TABLET | Freq: Two times a day (BID) | ORAL | Status: DC
  Administered 2012-10-18: 100 mg via ORAL
  Filled 2012-10-18 (×3): qty 1

## 2012-10-18 MED ORDER — METOPROLOL TARTRATE 12.5 MG HALF TABLET
12.5000 mg | ORAL_TABLET | Freq: Once | ORAL | Status: AC
  Administered 2012-10-19: 12.5 mg via ORAL
  Filled 2012-10-18: qty 1

## 2012-10-18 MED ORDER — ZOLPIDEM TARTRATE 5 MG PO TABS: 5.0000 mg | ORAL_TABLET | Freq: Every evening | ORAL | Status: DC | PRN

## 2012-10-18 MED ORDER — PLASMA-LYTE 148 IV SOLN
INTRAVENOUS | Status: AC
  Administered 2012-10-19: 09:00:00
  Filled 2012-10-18: qty 2.5

## 2012-10-18 MED ORDER — INSULIN PUMP
Freq: Three times a day (TID) | SUBCUTANEOUS | Status: DC
  Administered 2012-10-18: 8 via SUBCUTANEOUS
  Filled 2012-10-18: qty 1

## 2012-10-18 MED ORDER — PANTOPRAZOLE SODIUM 40 MG PO TBEC
40.0000 mg | DELAYED_RELEASE_TABLET | Freq: Two times a day (BID) | ORAL | Status: DC
  Administered 2012-10-18: 40 mg via ORAL
  Filled 2012-10-18: qty 1

## 2012-10-18 MED ORDER — CHLORHEXIDINE GLUCONATE 4 % EX LIQD
60.0000 mL | Freq: Once | CUTANEOUS | Status: AC
  Administered 2012-10-18: 4 via TOPICAL
  Filled 2012-10-18: qty 60

## 2012-10-18 MED ORDER — DEXMEDETOMIDINE HCL IN NACL 400 MCG/100ML IV SOLN
0.1000 ug/kg/h | INTRAVENOUS | Status: AC
  Administered 2012-10-19: 0.2 ug/kg/h via INTRAVENOUS
  Filled 2012-10-18: qty 100

## 2012-10-18 MED ORDER — EPINEPHRINE HCL 1 MG/ML IJ SOLN
0.5000 ug/min | INTRAVENOUS | Status: DC
  Filled 2012-10-18: qty 4

## 2012-10-18 MED ORDER — DEXTROSE 5 % IV SOLN
750.0000 mg | INTRAVENOUS | Status: DC
  Filled 2012-10-18: qty 750

## 2012-10-18 MED ORDER — ADULT MULTIVITAMIN W/MINERALS CH
1.0000 | ORAL_TABLET | Freq: Every day | ORAL | Status: DC
  Filled 2012-10-18: qty 1

## 2012-10-18 MED ORDER — ASPIRIN 81 MG PO TABS: 81.0000 mg | ORAL_TABLET | Freq: Every day | ORAL | Status: DC

## 2012-10-18 MED ORDER — BISACODYL 5 MG PO TBEC: 5.0000 mg | DELAYED_RELEASE_TABLET | Freq: Once | ORAL | Status: DC

## 2012-10-18 MED ORDER — CHLORHEXIDINE GLUCONATE 4 % EX LIQD
60.0000 mL | Freq: Once | CUTANEOUS | Status: AC
  Administered 2012-10-19: 4 via TOPICAL
  Filled 2012-10-18: qty 60

## 2012-10-18 MED ORDER — FOLIC ACID 1 MG PO TABS
1.0000 mg | ORAL_TABLET | Freq: Every day | ORAL | Status: DC
  Administered 2012-10-20 – 2012-10-23 (×4): 1 mg via ORAL
  Filled 2012-10-18 (×5): qty 1

## 2012-10-18 MED ORDER — AMLODIPINE BESYLATE 10 MG PO TABS
10.0000 mg | ORAL_TABLET | Freq: Every day | ORAL | Status: DC
  Filled 2012-10-18: qty 1

## 2012-10-18 MED ORDER — ATORVASTATIN CALCIUM 20 MG PO TABS
20.0000 mg | ORAL_TABLET | Freq: Every day | ORAL | Status: DC
  Administered 2012-10-18 – 2012-10-22 (×4): 20 mg via ORAL
  Filled 2012-10-18 (×6): qty 1

## 2012-10-18 MED ORDER — ALPRAZOLAM 0.25 MG PO TABS: 0.2500 mg | ORAL_TABLET | Freq: Two times a day (BID) | ORAL | Status: DC | PRN

## 2012-10-18 MED ORDER — FUROSEMIDE 40 MG PO TABS
40.0000 mg | ORAL_TABLET | Freq: Two times a day (BID) | ORAL | Status: DC
  Administered 2012-10-18: 40 mg via ORAL
  Filled 2012-10-18 (×4): qty 1

## 2012-10-18 MED ORDER — ONE-DAILY MULTI VITAMINS PO TABS: 1.0000 | ORAL_TABLET | Freq: Every day | ORAL | Status: DC

## 2012-10-18 MED ORDER — SODIUM CHLORIDE 0.9 % IV SOLN
INTRAVENOUS | Status: AC
  Administered 2012-10-19: 3.8 [IU]/h via INTRAVENOUS
  Filled 2012-10-18: qty 1

## 2012-10-18 NOTE — Progress Notes (Signed)
ANTICOAGULATION CONSULT NOTE - Initial Consult  Pharmacy Consult for Heparin  Indication: chest pain/ACS  Allergies  Allergen Reactions  . Codeine     nausea    Patient Measurements: Height: 5\' 4"  (162.6 cm) Weight: 159 lb (72.122 kg) IBW/kg (Calculated) : 54.7 Heparin Dosing Weight: 72Kg  Vital Signs:    Labs: No results found for this basename: HGB, HCT, PLT, APTT, LABPROT, INR, HEPARINUNFRC, CREATININE, CKTOTAL, CKMB, TROPONINI,  in the last 72 hours  Estimated Creatinine Clearance: 65.6 ml/min (by C-G formula based on Cr of 0.6).   Medical History: Past Medical History  Diagnosis Date  . Leukopenia 2012    s/p bone marrow biopsy, Dr. Sherrlyn Hock  . Cholelithiasis   . Rheumatoid arthritis(714.0)   . Diabetes mellitus   . GERD (gastroesophageal reflux disease)   . Hypertension   . Hyperlipidemia   . Esophageal stricture   . Chronic diastolic CHF (congestive heart failure)   . CAD (coronary artery disease)     a. 09/2012 Cath: LM nl, LAD 95p, 67m, LCX 62m, OM2 50, RCA 100.  Marland Kitchen Hypokalemia   . Anemia   . Herniated disc   . History of pancytopenia       Assessment: 80 yof with Hx DM HgA1c 8.5 (6/14), HLD, HTN, RA Recent hospitalization for pna at [facility] no CP but [facility] 12 - no cath at that admit d/t SOB -cath 6/19 at [facility] - transferred to [facility] - TCTS eval for 3V CAD.  Heparin to be started.  No Hx GIB, last CBC stable, renal fx stable.    Goal of Therapy:  Heparin level 0.3-0.7 units/ml Monitor platelets by anticoagulation protocol: Yes   Plan:  Heparin drip 850 uts/hr  Check HL in 6hr  Daily HL, CBC  Leota Sauers Pharm.D. CPP, BCPS Clinical Pharmacist [phone] 10/18/2012 2:15 PM

## 2012-10-18 NOTE — Progress Notes (Signed)
ANTICOAGULATION CONSULT NOTE - Follow Up Consult  Pharmacy Consult for heparin Indication: 3VCAD awaiting CABG  Labs:  Recent Labs  10/18/12 2300  HGB 12.4  HCT 36.0  PLT 191  HEPARINUNFRC 0.22*    Assessment: 66yo female subtherapeutic on heparin with initial dosing post-cath awaiting CABG in am.  Goal of Therapy:  Heparin level 0.3-0.7 units/ml   Plan:  Will increase heparin gtt by 2 units/kg/hr to 1000 units/hr and follow plan to turn off at 0400 prior to OR.  Vernard Gambles, PharmD, BCPS  10/18/2012,11:49 PM

## 2012-10-18 NOTE — Progress Notes (Signed)
Paged Dr. Tyrone Sage at this time to clarify pt's surgery time d/t preop test pending.

## 2012-10-18 NOTE — H&P (Signed)
Patient ID: Kaimana Neuzil MRN: [id number], DOB/AGE: [id number]   Admit date: 10/18/2012 And and and and  Primary Physician: Wynona Dove, MD Primary Cardiologist: Concha Se, MD   Pt. Profile:  67 year old female with history of type 1 diabetes who recently suffered a non-ST segment elevation myocardial infarction in the setting of pneumonia at [facility] who underwent diagnostic catheterization revealing multivessel disease.  Problem List  Past Medical History  Diagnosis Date  . Leukopenia 2012    s/p bone marrow biopsy, Dr. Sherrlyn Hock  . Cholelithiasis   . Rheumatoid arthritis(714.0)   . Diabetes mellitus   . GERD (gastroesophageal reflux disease)   . Hypertension   . Hyperlipidemia   . Esophageal stricture   . Chronic diastolic CHF (congestive heart failure)   . CAD (coronary artery disease)     a. 09/2012 Cath: LM nl, LAD 95p, 9m, LCX 18m, OM2 50, RCA 100.  Marland Kitchen Hypokalemia   . Anemia   . Herniated disc   . History of pancytopenia     Past Surgical History  Procedure Laterality Date  . Esophagogastroduodenoscopy  2012    Dr. Skeet Simmer  . Cataract extraction  2010    right     Allergies  Allergies  Allergen Reactions  . Codeine     nausea    HPI  67 year old female with a long history of type 1 diabetes that uses in insulin pump at home. She was admitted to [facility] in late May with viral upper respiratory infection and pneumonia and subsequently was found to have elevated troponins eventually peaking at 12. She was seen by cardiology at that time however catheterization was deferred. She was seen in clinic again on June 2 and had been doing well. The topic of catheterization was broached once again and after some thinking, patient decided that she would go forward with the procedure. This was scheduled for earlier today at [facility] and showed multivessel CAD as outlined above. After review of films and discussion with thoracic surgery,  she was transferred to [facility] for thoracic surgery consultation. She is currently pain-free without any complaints.   Home Medications  Prior to Admission medications   Medication Sig Start Date End Date Taking? Authorizing Provider  amLODipine (NORVASC) 10 MG tablet Take 1 tablet (10 mg total) by mouth daily. 01/19/12   Wynona Dove, MD  aspirin 81 MG tablet Take 81 mg by mouth daily.      Historical Provider, MD  atorvastatin (LIPITOR) 20 MG tablet Take 1 tablet (20 mg total) by mouth daily. 05/22/12 05/22/13  Wynona Dove, MD  Calcium Carbonate Antacid (TUMS PO) Take 4 tablets by mouth daily.      Historical Provider, MD  Cholecalciferol (VITAMIN D) 2000 UNITS CAPS Take 1 capsule by mouth daily.    Historical Provider, MD  Dextromethorphan-Guaifenesin [facility] DM PO) Take by mouth 2 (two) times daily.    Historical Provider, MD  Dihydroxyaluminum Sod Carb (ROLAIDS PO) Take by mouth as needed.      Historical Provider, MD  etanercept (ENBREL) 25 MG injection Inject 25 mg into the skin once a week.    Historical Provider, MD  folic acid (FOLVITE) 1 MG tablet Take 1 tablet (1 mg total) by mouth daily. 02/02/12   Wynona Dove, MD  furosemide (LASIX) 40 MG tablet Take 1 tablet (40 mg total) by mouth 2 (two) times daily. 09/14/11   Wynona Dove, MD  ibuprofen (ADVIL,MOTRIN) 800 MG tablet Take 1  tablet (800 mg total) by mouth 3 (three) times daily as needed. 07/05/12   Wynona Dove, MD  insulin lispro (HUMALOG) 100 UNIT/ML injection As directed via Insulin pump 07/16/12   Wynona Dove, MD  iron polysaccharides (NIFEREX) 150 MG capsule Take 150 mg by mouth 2 (two) times daily.    Historical Provider, MD  Boris Lown Oil 300 MG CAPS Take by mouth daily.    Historical Provider, MD  losartan (COZAAR) 100 MG tablet Take 1 tablet (100 mg total) by mouth daily. 02/02/12   Wynona Dove, MD  methotrexate (RHEUMATREX) 2.5 MG tablet Take 4 mg by mouth  once a week.  12/16/10   Historical Provider, MD  metoprolol (LOPRESSOR) 100 MG tablet Take 1 tablet (100 mg total) by mouth 2 (two) times daily. 06/11/12   Wynona Dove, MD  Multiple Vitamin (MULTIVITAMIN) tablet Take 1 tablet by mouth daily.      Historical Provider, MD  NITROSTAT 0.4 MG SL tablet 0.4 mg every 5 (five) minutes as needed.  09/27/12   Historical Provider, MD  pantoprazole (PROTONIX) 40 MG tablet Take one tablet by mouth twice daily. 08/13/12   Beverley Fiedler, MD  pyridOXINE (VITAMIN B-6) 100 MG tablet Take 100 mg by mouth daily.      Historical Provider, MD  Triamcinolone Acetonide (TRIAMCINOLONE 0.1 % CREAM : EUCERIN) CREA Apply 1 application topically 2 (two) times daily as needed. 07/06/11   Wynona Dove, MD  zoledronic acid (RECLAST) 5 MG/100ML SOLN Inject 5 mg into the vein once.    Historical Provider, MD   Family History  Family History  Problem Relation Age of Onset  . Diabetes Mother   . Arthritis Mother   . Diabetes Father   . Arthritis Father   . Bone cancer Sister    Social History  History   Social History  . Marital Status: Married    Spouse Name: N/A    Number of Children: 3  . Years of Education: N/A   Occupational History  . Retired    Social History Main Topics  . Smoking status: Former Smoker    Types: Cigarettes    Quit date: 06/11/1994  . Smokeless tobacco: Never Used  . Alcohol Use: No  . Drug Use: No  . Sexually Active: Not on file   Other Topics Concern  . Not on file   Social History Narrative  . No narrative on file    Review of Systems General:  No chills, fever, night sweats or weight changes.  Cardiovascular:  No chest pain, dyspnea on exertion, edema, orthopnea, palpitations, paroxysmal nocturnal dyspnea. Dermatological: No rash, lesions/masses Respiratory: No cough, dyspnea Urologic: No hematuria, dysuria Abdominal:   No nausea, vomiting, diarrhea, bright red blood per rectum, melena, or  hematemesis Neurologic:  No visual changes, wkns, changes in mental status. All other systems reviewed and are otherwise negative except as noted above.  Physical Exam  Blood pressure 169/69, pulse 66, temperature 97.4 F (36.3 C), temperature source Oral, resp. rate 16, height 5\' 4"  (1.626 m), weight 159 lb (72.122 kg), SpO2 95.00%.  General: Pleasant, NAD Psych: Normal affect. Neuro: Alert and oriented X 3. Moves all extremities spontaneously. HEENT: Normal  Neck: Supple without bruits or JVD. Lungs:  Resp regular and unlabored, CTA. Heart: RRR no s3, s4, or murmurs. Abdomen: Soft, non-tender, non-distended, BS + x 4.  Extremities: No clubbing, cyanosis or edema. DP/PT/Radials 2+ and equal bilaterally.  Right groin cath site is  without bleeding, bruits, or hematoma.  Labs   Lab Results  Component Value Date   WBC 4.2* 10/09/2012   HGB 11.9* 10/09/2012   HCT 35.3* 10/09/2012   MCV 92.8 10/09/2012   PLT 363.0 10/09/2012    Radiology/Studies  No results found.  ECG  pending  ASSESSMENT AND PLAN  1. Coronary artery disease: Patient is status post non-ST segment elevation myocardial infarction in the setting of pneumonia back in May. She underwent diagnostic catheterization today which revealed multivessel coronary artery disease. She was transferred here for thoracic surgery consultation. She's currently stable and without chest pain. We will plan to resume heparin 6 hours after sheath pull.  Continue aspirin, statin, beta blocker, and ARB.  2. Diabetes mellitus: She is insulin-dependent and uses in insulin pump. We'll continue this here.  3. Hyperlipidemia: Continue statin therapy.  4. Hypertension: Continue home regimen, follow, and adjust.  5. Rheumatoid arthritis: She takes several medications weekly. Hold these at this time.  Signed, Nicolasa Ducking, NP 10/18/2012, 4:42 PM  History and all data above reviewed.  Patient examined.  I agree with the findings as above.  The patient is s/p cath.  She has no acute chest pain.  She is transferred for CABG The patient exam reveals COR:RRR  ,  Lungs: Clear  ,  Abd: Positive bowel sounds, no rebound no guarding, Ext No edema  .  All available labs, radiology testing, previous records reviewed. Agree with documented assessment and plan. Admitted for CABG.  We will work to expedite the pre op testing for planned CABG in the AM.   She might need IV insulin.  Fayrene Fearing Tiant [place]  6:30 PM  10/18/2012

## 2012-10-18 NOTE — Progress Notes (Signed)
PFT completed. Unconfirmed results placed in Progress Notes of [facility]. 

## 2012-10-18 NOTE — Progress Notes (Addendum)
Dr. Dorris Fetch instructed pt to give bolus dose of insulin since patient figured out her pump had been off.  Pt did so at this time.  He also instructed to recheck BS at 2000 and start insulin gtt if greater than 200.  Care instruction order in epic with instructions.

## 2012-10-18 NOTE — Progress Notes (Signed)
bs 430.  Dr. Dorris Fetch in room.  Asked if he wants lab to verify glucose.  He says yes and wait until he puts rest of preop lab orders in to verify all at once.  Moved insulin pump time to 2000.

## 2012-10-18 NOTE — Consult Note (Signed)
Reason for Consult:3 vessel CAD Referring Physician: Dr. Gollan  [name] is an 67 y.o. female.  HPI: 67 yo WF with no prior cardiac history. She was hospitalized in May with a respiratory illness- "double pneumonia". During that admission she had a positive troponin of 12. Cath deferred due to an inability to tolerate the procedure. Today she presented for a cardiac catheterization and was found to have severe 3 vessel CAD.  She initially denied CP. But on further questioning she has been having some CP, which she attributed to reflux/ indigestion. She also gets SOB with minimal exertion.   Past Medical History  Diagnosis Date  . Leukopenia 2012    s/p bone marrow biopsy, Dr. Pandit  . Cholelithiasis   . GERD (gastroesophageal reflux disease)   . Hypertension   . Hyperlipidemia   . Esophageal stricture   . Chronic diastolic CHF (congestive heart failure)   . CAD (coronary artery disease)     a. 09/2012 Cath: LM nl, LAD 95p, 70m, LCX 90m, OM2 50, RCA 100.  . Hypokalemia   . Herniated disc   . Pneumonia 2013; 08/2012    "one lung; double" (10/18/2012)  . Exertional shortness of breath   . Type I diabetes mellitus     "dx'd in 1957" (10/18/2012)  . History of pancytopenia   . H/O hiatal hernia   . Rheumatoid arthritis(714.0)   . NSTEMI (non-ST elevated myocardial infarction) 08/2012    "mild" (10/18/2012)    Past Surgical History  Procedure Laterality Date  . Esophagogastroduodenoscopy  2012    Dr. Iftikar  . Tubal ligation  1970  . Cataract extraction w/ intraocular lens implant Right 2010  . Cardiac catheterization  10/18/2012    "first one was today" (10/18/2012)  . Esophageal dilation      "3 or 4 times" (10/18/2012)    Family History  Problem Relation Age of Onset  . Diabetes Mother   . Arthritis Mother   . Diabetes Father   . Arthritis Father   . Bone cancer Sister     Social History:  reports that she quit smoking about 18 years ago. Her smoking use included  Cigarettes. She has a 30 pack-year smoking history. She has never used smokeless tobacco. She reports that she does not drink alcohol or use illicit drugs.  Allergies:  Allergies  Allergen Reactions  . Codeine     nausea    Medications:  Prior to Admission:  Prescriptions prior to admission  Medication Sig Dispense Refill  . amLODipine (NORVASC) 10 MG tablet Take 1 tablet (10 mg total) by mouth daily.  90 tablet  3  . aspirin 81 MG tablet Take 81 mg by mouth daily.        . atorvastatin (LIPITOR) 20 MG tablet Take 1 tablet (20 mg total) by mouth daily.  90 tablet  2  . Calcium Carbonate Antacid (TUMS PO) Take 4 tablets by mouth daily.        . Cholecalciferol (VITAMIN D) 2000 UNITS CAPS Take 1 capsule by mouth daily.      . Dextromethorphan-Guaifenesin (MUCINEX DM PO) Take by mouth 2 (two) times daily.      . Dihydroxyaluminum Sod Carb (ROLAIDS PO) Take by mouth as needed.        . etanercept (ENBREL) 25 MG injection Inject 25 mg into the skin once a week.      . folic acid (FOLVITE) 1 MG tablet Take 1 tablet (1 mg total) by mouth   daily.  90 tablet  3  . furosemide (LASIX) 40 MG tablet Take 1 tablet (40 mg total) by mouth 2 (two) times daily.  60 tablet  5  . ibuprofen (ADVIL,MOTRIN) 800 MG tablet Take 1 tablet (800 mg total) by mouth 3 (three) times daily as needed.  160 tablet  0  . insulin lispro (HUMALOG) 100 UNIT/ML injection As directed via Insulin pump  30 mL  11  . iron polysaccharides (NIFEREX) 150 MG capsule Take 150 mg by mouth 2 (two) times daily.      . Krill Oil 300 MG CAPS Take by mouth daily.      . losartan (COZAAR) 100 MG tablet Take 1 tablet (100 mg total) by mouth daily.  90 tablet  3  . methotrexate (RHEUMATREX) 2.5 MG tablet Take 4 mg by mouth once a week.       . metoprolol (LOPRESSOR) 100 MG tablet Take 1 tablet (100 mg total) by mouth 2 (two) times daily.  180 tablet  1  . Multiple Vitamin (MULTIVITAMIN) tablet Take 1 tablet by mouth daily.        . NITROSTAT  0.4 MG SL tablet 0.4 mg every 5 (five) minutes as needed.       . pantoprazole (PROTONIX) 40 MG tablet Take one tablet by mouth twice daily.  60 tablet  6  . pyridOXINE (VITAMIN B-6) 100 MG tablet Take 100 mg by mouth daily.        . Triamcinolone Acetonide (TRIAMCINOLONE 0.1 % CREAM : EUCERIN) CREA Apply 1 application topically 2 (two) times daily as needed.  1 each  3  . zoledronic acid (RECLAST) 5 MG/100ML SOLN Inject 5 mg into the vein once.        Results for orders placed during the hospital encounter of 10/18/12 (from the past 48 hour(s))  GLUCOSE, CAPILLARY     Status: Abnormal   Collection Time    10/18/12 12:33 PM      Result Value Range   Glucose-Capillary 63 (*) 70 - 99 mg/dL   Comment 1 Notify RN    GLUCOSE, CAPILLARY     Status: Abnormal   Collection Time    10/18/12  5:34 PM      Result Value Range   Glucose-Capillary 430 (*) 70 - 99 mg/dL    No results found.  Review of Systems  Constitutional: Positive for malaise/fatigue. Negative for fever and chills.  Respiratory: Positive for cough and shortness of breath.   Cardiovascular: Positive for chest pain ("indigestion"). Negative for palpitations, orthopnea, claudication and leg swelling.  Musculoskeletal: Positive for joint pain.  All other systems reviewed and are negative.   Blood pressure 169/69, pulse 66, temperature 97.4 F (36.3 C), temperature source Oral, resp. rate 16, height 5' 4" (1.626 m), weight 159 lb (72.122 kg), SpO2 95.00%. Physical Exam  Vitals reviewed. Constitutional: She is oriented to person, place, and time. She appears well-developed and well-nourished. No distress.  HENT:  Head: Normocephalic and atraumatic.  Eyes: EOM are normal. Pupils are equal, round, and reactive to light.  Neck: Neck supple. No thyromegaly present.  No bruits  Cardiovascular: Normal rate, regular rhythm, normal heart sounds and intact distal pulses.  Exam reveals no gallop and no friction rub.   No murmur  heard. Respiratory: Effort normal and breath sounds normal.  GI: Soft. There is no tenderness.  Musculoskeletal: She exhibits no edema.  Arthritic changes in hands  Lymphadenopathy:    She has no cervical adenopathy.    Neurological: She is alert and oriented to person, place, and time. No cranial nerve deficit.  Skin: Skin is warm and dry.    Assessment/Plan: 66 yo with multiple CRF who had a NQWMI about a month ago when hospitalized with a respiratory illness. She now has been found to have severe 3 vessel CAD with normal LV function. CABG indicated for 3 vessel CAD.  I discussed with the patient and her family the general nature of the procedure, the need for general anesthesia, and the incisions to be used. We discussed the expected hospital stay, overall recovery and short and long term outcomes. They understand the risks include but are not limited to death, stroke, MI, DVT/PE, bleeding, possible need for transfusion, infections, cardiac arrhythmias, and other organ system dysfunction including respiratory, renal, or GI complications. She accepts the risks and agrees to proceed.  For CABG in AM  Her CBG is currently 460- she will bolus herself with the insulin pump. We will check her again around 8 o'clock. If still > 200 will start insulin gtt and glucommander Suriyah Vergara C 10/18/2012, 5:51 PM      

## 2012-10-19 ENCOUNTER — Encounter (HOSPITAL_COMMUNITY): Payer: Self-pay | Admitting: Certified Registered"

## 2012-10-19 ENCOUNTER — Encounter (HOSPITAL_COMMUNITY)
Admission: AD | Disposition: A | Discharge status: Home / Self Care | Payer: Self-pay | Source: Other Acute Inpatient Hospital | Attending: Thoracic Surgery (Cardiothoracic Vascular Surgery)

## 2012-10-19 ENCOUNTER — Inpatient Hospital Stay (HOSPITAL_COMMUNITY): Payer: Medicare Other | Admitting: Certified Registered"

## 2012-10-19 ENCOUNTER — Inpatient Hospital Stay (HOSPITAL_COMMUNITY): Payer: Medicare Other

## 2012-10-19 DIAGNOSIS — I2510 Atherosclerotic heart disease of native coronary artery without angina pectoris: Secondary | ICD-10-CM

## 2012-10-19 HISTORY — PX: CORONARY ARTERY BYPASS GRAFT: SHX141

## 2012-10-19 LAB — GLUCOSE, CAPILLARY
Glucose-Capillary: 91 mg/dL (ref 70–99)
Glucose-Capillary: 73 mg/dL (ref 70–99)
Glucose-Capillary: 90 mg/dL (ref 70–99)

## 2012-10-19 LAB — HEPARIN LEVEL (UNFRACTIONATED): Heparin Unfractionated: 0.35 IU/mL (ref 0.30–0.70)

## 2012-10-19 LAB — CBC
HCT: 35.4 % — ABNORMAL LOW (ref 36.0–46.0)
HCT: 24.8 % — ABNORMAL LOW (ref 36.0–46.0)
Hemoglobin: 12 g/dL (ref 12.0–15.0)
Hemoglobin: 8.8 g/dL — ABNORMAL LOW (ref 12.0–15.0)
MCH: 30.3 pg (ref 26.0–34.0)
MCH: 31.3 pg (ref 26.0–34.0)
MCHC: 33.9 g/dL (ref 30.0–36.0)
MCHC: 35.5 g/dL (ref 30.0–36.0)
MCV: 87.5 fL (ref 78.0–100.0)
Platelets: 85 10*3/uL — ABNORMAL LOW (ref 150–400)
RBC: 3.04 MIL/uL — ABNORMAL LOW (ref 3.87–5.11)
RDW: 14.4 % (ref 11.5–15.5)
RDW: 14.6 % (ref 11.5–15.5)
WBC: 5.3 10*3/uL (ref 4.0–10.5)

## 2012-10-19 LAB — APTT
aPTT: 62 seconds — ABNORMAL HIGH (ref 24–37)
aPTT: 35 seconds (ref 24–37)

## 2012-10-19 LAB — POCT I-STAT 3, ART BLOOD GAS (G3+)
Acid-base deficit: 1 mmol/L (ref 0.0–2.0)
Acid-base deficit: 4 mmol/L — ABNORMAL HIGH (ref 0.0–2.0)
Acid-base deficit: 6 mmol/L — ABNORMAL HIGH (ref 0.0–2.0)
Bicarbonate: 25 mEq/L — ABNORMAL HIGH (ref 20.0–24.0)
Bicarbonate: 23.5 mEq/L (ref 20.0–24.0)
Bicarbonate: 21.1 mEq/L (ref 20.0–24.0)
O2 Saturation: 96 %
Patient temperature: 36.7
Patient temperature: 36.6
TCO2: 25 mmol/L (ref 0–100)
TCO2: 22 mmol/L (ref 0–100)
pCO2 arterial: 42.8 mmHg (ref 35.0–45.0)
pCO2 arterial: 37.4 mmHg (ref 35.0–45.0)
pH, Arterial: 7.375 (ref 7.350–7.450)
pH, Arterial: 7.337 — ABNORMAL LOW (ref 7.350–7.450)
pH, Arterial: 7.322 — ABNORMAL LOW (ref 7.350–7.450)
pO2, Arterial: 379 mmHg — ABNORMAL HIGH (ref 80.0–100.0)
pO2, Arterial: 77 mmHg — ABNORMAL LOW (ref 80.0–100.0)

## 2012-10-19 LAB — POCT I-STAT 4, (NA,K, GLUC, HGB,HCT)
Glucose, Bld: 125 mg/dL — ABNORMAL HIGH (ref 70–99)
Glucose, Bld: 64 mg/dL — ABNORMAL LOW (ref 70–99)
Glucose, Bld: 127 mg/dL — ABNORMAL HIGH (ref 70–99)
Glucose, Bld: 112 mg/dL — ABNORMAL HIGH (ref 70–99)
HCT: 20 % — ABNORMAL LOW (ref 36.0–46.0)
HCT: 26 % — ABNORMAL LOW (ref 36.0–46.0)
HCT: 25 % — ABNORMAL LOW (ref 36.0–46.0)
HCT: 24 % — ABNORMAL LOW (ref 36.0–46.0)
Hemoglobin: 10.5 g/dL — ABNORMAL LOW (ref 12.0–15.0)
Hemoglobin: 6.8 g/dL — CL (ref 12.0–15.0)
Hemoglobin: 8.8 g/dL — ABNORMAL LOW (ref 12.0–15.0)
Hemoglobin: 8.5 g/dL — ABNORMAL LOW (ref 12.0–15.0)
Potassium: 3.2 mEq/L — ABNORMAL LOW (ref 3.5–5.1)
Potassium: 4 mEq/L (ref 3.5–5.1)
Sodium: 139 mEq/L (ref 135–145)
Sodium: 138 mEq/L (ref 135–145)
Sodium: 136 mEq/L (ref 135–145)
Sodium: 137 mEq/L (ref 135–145)
Sodium: 141 mEq/L (ref 135–145)
Sodium: 141 mEq/L (ref 135–145)

## 2012-10-19 LAB — HEMOGLOBIN AND HEMATOCRIT, BLOOD
HCT: 24.1 % — ABNORMAL LOW (ref 36.0–46.0)
Hemoglobin: 8.7 g/dL — ABNORMAL LOW (ref 12.0–15.0)

## 2012-10-19 LAB — COMPREHENSIVE METABOLIC PANEL
ALT: 14 U/L (ref 0–35)
AST: 19 U/L (ref 0–37)
Alkaline Phosphatase: 50 U/L (ref 39–117)
CO2: 29 mEq/L (ref 19–32)
Calcium: 9 mg/dL (ref 8.4–10.5)
Potassium: 3.7 mEq/L (ref 3.5–5.1)
Sodium: 137 mEq/L (ref 135–145)
Total Protein: 7 g/dL (ref 6.0–8.3)

## 2012-10-19 LAB — PROTIME-INR
INR: 1.49 (ref 0.00–1.49)
Prothrombin Time: 17.6 seconds — ABNORMAL HIGH (ref 11.6–15.2)

## 2012-10-19 LAB — POCT I-STAT, CHEM 8
Calcium, Ion: 1.12 mmol/L — ABNORMAL LOW (ref 1.13–1.30)
Chloride: 110 mEq/L (ref 96–112)
Glucose, Bld: 154 mg/dL — ABNORMAL HIGH (ref 70–99)
HCT: 25 % — ABNORMAL LOW (ref 36.0–46.0)
Hemoglobin: 8.5 g/dL — ABNORMAL LOW (ref 12.0–15.0)

## 2012-10-19 LAB — MAGNESIUM: Magnesium: 3.4 mg/dL — ABNORMAL HIGH (ref 1.5–2.5)

## 2012-10-19 LAB — POCT I-STAT GLUCOSE
Glucose, Bld: 148 mg/dL — ABNORMAL HIGH (ref 70–99)
Operator id: 333011

## 2012-10-19 SURGERY — CORONARY ARTERY BYPASS GRAFTING (CABG)
Anesthesia: General | Site: Chest | Wound class: Clean

## 2012-10-19 MED ORDER — FENTANYL CITRATE 0.05 MG/ML IJ SOLN
INTRAMUSCULAR | Status: DC | PRN
  Administered 2012-10-19 (×2): 100 ug via INTRAVENOUS
  Administered 2012-10-19: 150 ug via INTRAVENOUS
  Administered 2012-10-19: 350 ug via INTRAVENOUS
  Administered 2012-10-19: 150 ug via INTRAVENOUS
  Administered 2012-10-19: 250 ug via INTRAVENOUS
  Administered 2012-10-19: 50 ug via INTRAVENOUS
  Administered 2012-10-19: 100 ug via INTRAVENOUS

## 2012-10-19 MED ORDER — METOPROLOL TARTRATE 25 MG/10 ML ORAL SUSPENSION
12.5000 mg | Freq: Two times a day (BID) | ORAL | Status: DC
  Filled 2012-10-19 (×5): qty 5

## 2012-10-19 MED ORDER — SODIUM CHLORIDE 0.9 % IJ SOLN
3.0000 mL | INTRAMUSCULAR | Status: DC | PRN
  Administered 2012-10-20: 3 mL via INTRAVENOUS

## 2012-10-19 MED ORDER — MORPHINE SULFATE 2 MG/ML IJ SOLN
2.0000 mg | INTRAMUSCULAR | Status: DC | PRN
  Administered 2012-10-19 – 2012-10-20 (×5): 2 mg via INTRAVENOUS
  Filled 2012-10-19 (×3): qty 1

## 2012-10-19 MED ORDER — SODIUM CHLORIDE 0.9 % IV SOLN: 250.0000 mL | INTRAVENOUS | Status: DC

## 2012-10-19 MED ORDER — SODIUM CHLORIDE 0.9 % IV SOLN
INTRAVENOUS | Status: DC | PRN
  Administered 2012-10-19: 14:00:00 via INTRAVENOUS

## 2012-10-19 MED ORDER — DOCUSATE SODIUM 100 MG PO CAPS
200.0000 mg | ORAL_CAPSULE | Freq: Every day | ORAL | Status: DC
  Administered 2012-10-20 – 2012-10-22 (×3): 200 mg via ORAL
  Filled 2012-10-19 (×3): qty 2
  Filled 2012-10-19: qty 1

## 2012-10-19 MED ORDER — INSULIN REGULAR BOLUS VIA INFUSION
0.0000 [IU] | Freq: Three times a day (TID) | INTRAVENOUS | Status: DC
  Filled 2012-10-19: qty 10

## 2012-10-19 MED ORDER — POLYSACCHARIDE IRON COMPLEX 150 MG PO CAPS
150.0000 mg | ORAL_CAPSULE | Freq: Every day | ORAL | Status: DC
  Administered 2012-10-21 – 2012-10-23 (×3): 150 mg via ORAL
  Filled 2012-10-19 (×3): qty 1

## 2012-10-19 MED ORDER — METOCLOPRAMIDE HCL 5 MG/ML IJ SOLN
10.0000 mg | Freq: Four times a day (QID) | INTRAMUSCULAR | Status: AC
  Administered 2012-10-19 – 2012-10-20 (×4): 10 mg via INTRAVENOUS
  Filled 2012-10-19 (×4): qty 2

## 2012-10-19 MED ORDER — ASPIRIN EC 325 MG PO TBEC
325.0000 mg | DELAYED_RELEASE_TABLET | Freq: Every day | ORAL | Status: DC
  Administered 2012-10-20 – 2012-10-23 (×4): 325 mg via ORAL
  Filled 2012-10-19 (×4): qty 1

## 2012-10-19 MED ORDER — FAMOTIDINE IN NACL 20-0.9 MG/50ML-% IV SOLN
20.0000 mg | Freq: Two times a day (BID) | INTRAVENOUS | Status: DC
  Administered 2012-10-19: 20 mg via INTRAVENOUS
  Filled 2012-10-19: qty 50

## 2012-10-19 MED ORDER — VANCOMYCIN HCL IN DEXTROSE 1-5 GM/200ML-% IV SOLN
1000.0000 mg | Freq: Once | INTRAVENOUS | Status: AC
  Administered 2012-10-19: 1000 mg via INTRAVENOUS
  Filled 2012-10-19: qty 200

## 2012-10-19 MED ORDER — MORPHINE SULFATE 2 MG/ML IJ SOLN: 1.0000 mg | INTRAMUSCULAR | Status: AC | PRN

## 2012-10-19 MED ORDER — TRAMADOL HCL 50 MG PO TABS
50.0000 mg | ORAL_TABLET | ORAL | Status: DC | PRN
  Administered 2012-10-21 (×2): 100 mg via ORAL
  Administered 2012-10-21: 50 mg via ORAL
  Filled 2012-10-19: qty 2
  Filled 2012-10-19: qty 1
  Filled 2012-10-19: qty 2

## 2012-10-19 MED ORDER — METOPROLOL TARTRATE 1 MG/ML IV SOLN: 2.5000 mg | INTRAVENOUS | Status: DC | PRN

## 2012-10-19 MED ORDER — ADULT MULTIVITAMIN W/MINERALS CH
1.0000 | ORAL_TABLET | Freq: Every day | ORAL | Status: DC
  Administered 2012-10-21 – 2012-10-23 (×3): 1 via ORAL
  Filled 2012-10-19 (×3): qty 1

## 2012-10-19 MED ORDER — HEMOSTATIC AGENTS (NO CHARGE) OPTIME
TOPICAL | Status: DC | PRN
  Administered 2012-10-19: 1 via TOPICAL

## 2012-10-19 MED ORDER — NITROGLYCERIN IN D5W 200-5 MCG/ML-% IV SOLN: 0.0000 ug/min | INTRAVENOUS | Status: DC

## 2012-10-19 MED ORDER — THROMBIN 20000 UNITS EX SOLR
OROMUCOSAL | Status: DC | PRN
  Administered 2012-10-19 (×4): via TOPICAL

## 2012-10-19 MED ORDER — INSULIN ASPART 100 UNIT/ML ~~LOC~~ SOLN
0.0000 [IU] | SUBCUTANEOUS | Status: DC
  Administered 2012-10-19: 3 [IU] via SUBCUTANEOUS

## 2012-10-19 MED ORDER — BISACODYL 10 MG RE SUPP: 10.0000 mg | Freq: Every day | RECTAL | Status: DC

## 2012-10-19 MED ORDER — ARTIFICIAL TEARS OP OINT
TOPICAL_OINTMENT | OPHTHALMIC | Status: DC | PRN
  Administered 2012-10-19: 1 via OPHTHALMIC

## 2012-10-19 MED ORDER — POTASSIUM CHLORIDE 10 MEQ/50ML IV SOLN
10.0000 meq | Freq: Once | INTRAVENOUS | Status: AC
  Administered 2012-10-19: 10 meq via INTRAVENOUS

## 2012-10-19 MED ORDER — METOPROLOL TARTRATE 12.5 MG HALF TABLET
12.5000 mg | ORAL_TABLET | Freq: Two times a day (BID) | ORAL | Status: DC
  Administered 2012-10-20 – 2012-10-23 (×6): 12.5 mg via ORAL
  Filled 2012-10-19 (×9): qty 1

## 2012-10-19 MED ORDER — POTASSIUM CHLORIDE 10 MEQ/50ML IV SOLN
10.0000 meq | INTRAVENOUS | Status: AC
  Administered 2012-10-19 (×3): 10 meq via INTRAVENOUS

## 2012-10-19 MED ORDER — HEPARIN SODIUM (PORCINE) 1000 UNIT/ML IJ SOLN
INTRAMUSCULAR | Status: DC | PRN
  Administered 2012-10-19: 2000 [IU] via INTRAVENOUS
  Administered 2012-10-19: 21000 [IU] via INTRAVENOUS

## 2012-10-19 MED ORDER — PHENYLEPHRINE HCL 10 MG/ML IJ SOLN
INTRAMUSCULAR | Status: DC | PRN
  Administered 2012-10-19: 20 ug via INTRAVENOUS

## 2012-10-19 MED ORDER — DEXTROSE 5 % IV SOLN
1.5000 g | Freq: Two times a day (BID) | INTRAVENOUS | Status: AC
  Administered 2012-10-19 – 2012-10-21 (×4): 1.5 g via INTRAVENOUS
  Filled 2012-10-19 (×4): qty 1.5

## 2012-10-19 MED ORDER — ACETAMINOPHEN 10 MG/ML IV SOLN
1000.0000 mg | Freq: Once | INTRAVENOUS | Status: AC
  Administered 2012-10-19: 1000 mg via INTRAVENOUS
  Filled 2012-10-19: qty 100

## 2012-10-19 MED ORDER — ACETAMINOPHEN 160 MG/5ML PO SOLN: 975.0000 mg | Freq: Four times a day (QID) | ORAL | Status: DC

## 2012-10-19 MED ORDER — PROPOFOL 10 MG/ML IV BOLUS
INTRAVENOUS | Status: DC | PRN
  Administered 2012-10-19: 140 mg via INTRAVENOUS

## 2012-10-19 MED ORDER — ALBUMIN HUMAN 5 % IV SOLN
250.0000 mL | INTRAVENOUS | Status: AC | PRN
  Administered 2012-10-19 (×2): 250 mL via INTRAVENOUS
  Filled 2012-10-19 (×2): qty 250

## 2012-10-19 MED ORDER — PROTAMINE SULFATE 10 MG/ML IV SOLN
INTRAVENOUS | Status: DC | PRN
  Administered 2012-10-19: 100 mg via INTRAVENOUS
  Administered 2012-10-19: 20 mg via INTRAVENOUS
  Administered 2012-10-19: 80 mg via INTRAVENOUS

## 2012-10-19 MED ORDER — SODIUM CHLORIDE 0.9 % IV SOLN
INTRAVENOUS | Status: DC
  Administered 2012-10-19: 20 mL/h via INTRAVENOUS
  Administered 2012-10-21: 05:00:00 via INTRAVENOUS

## 2012-10-19 MED ORDER — ONDANSETRON HCL 4 MG/2ML IJ SOLN
4.0000 mg | Freq: Four times a day (QID) | INTRAMUSCULAR | Status: DC | PRN
  Administered 2012-10-19 – 2012-10-20 (×3): 4 mg via INTRAVENOUS
  Filled 2012-10-19 (×3): qty 2

## 2012-10-19 MED ORDER — ROCURONIUM BROMIDE 100 MG/10ML IV SOLN
INTRAVENOUS | Status: DC | PRN
  Administered 2012-10-19: 20 mg via INTRAVENOUS
  Administered 2012-10-19: 100 mg via INTRAVENOUS

## 2012-10-19 MED ORDER — PANTOPRAZOLE SODIUM 40 MG PO TBEC
40.0000 mg | DELAYED_RELEASE_TABLET | Freq: Every day | ORAL | Status: DC
  Administered 2012-10-21 – 2012-10-23 (×3): 40 mg via ORAL
  Filled 2012-10-19 (×3): qty 1

## 2012-10-19 MED ORDER — SODIUM CHLORIDE 0.9 % IJ SOLN
3.0000 mL | Freq: Two times a day (BID) | INTRAMUSCULAR | Status: DC
  Administered 2012-10-20: 3 mL via INTRAVENOUS

## 2012-10-19 MED ORDER — PHENYLEPHRINE HCL 10 MG/ML IJ SOLN
0.0000 ug/min | INTRAVENOUS | Status: DC
  Administered 2012-10-19: 10 ug/min via INTRAVENOUS
  Administered 2012-10-20: 35 ug/min via INTRAVENOUS
  Filled 2012-10-19 (×3): qty 2

## 2012-10-19 MED ORDER — VECURONIUM BROMIDE 10 MG IV SOLR
INTRAVENOUS | Status: DC | PRN
  Administered 2012-10-19 (×2): 5 mg via INTRAVENOUS

## 2012-10-19 MED ORDER — ACETAMINOPHEN 500 MG PO TABS
1000.0000 mg | ORAL_TABLET | Freq: Four times a day (QID) | ORAL | Status: DC
  Administered 2012-10-20 – 2012-10-21 (×5): 1000 mg via ORAL
  Filled 2012-10-19 (×9): qty 2

## 2012-10-19 MED ORDER — DEXMEDETOMIDINE HCL IN NACL 200 MCG/50ML IV SOLN: 0.1000 ug/kg/h | INTRAVENOUS | Status: DC

## 2012-10-19 MED ORDER — MIDAZOLAM HCL 5 MG/5ML IJ SOLN
INTRAMUSCULAR | Status: DC | PRN
  Administered 2012-10-19 (×5): 2 mg via INTRAVENOUS

## 2012-10-19 MED ORDER — ALBUMIN HUMAN 5 % IV SOLN
INTRAVENOUS | Status: DC | PRN
  Administered 2012-10-19: 14:00:00 via INTRAVENOUS

## 2012-10-19 MED ORDER — BISACODYL 5 MG PO TBEC
10.0000 mg | DELAYED_RELEASE_TABLET | Freq: Every day | ORAL | Status: DC
  Administered 2012-10-20 – 2012-10-22 (×3): 10 mg via ORAL
  Filled 2012-10-19: qty 2
  Filled 2012-10-19: qty 1
  Filled 2012-10-19: qty 2

## 2012-10-19 MED ORDER — DEXTROSE 50 % IV SOLN
INTRAVENOUS | Status: DC | PRN
  Administered 2012-10-19: 14 mL via INTRAVENOUS

## 2012-10-19 MED ORDER — LACTATED RINGERS IV SOLN
INTRAVENOUS | Status: DC | PRN
  Administered 2012-10-19 (×3): via INTRAVENOUS

## 2012-10-19 MED ORDER — LACTATED RINGERS IV SOLN
INTRAVENOUS | Status: DC
  Administered 2012-10-19: 20 mL/h via INTRAVENOUS

## 2012-10-19 MED ORDER — LACTATED RINGERS IV SOLN: 500.0000 mL | Freq: Once | INTRAVENOUS | Status: AC | PRN

## 2012-10-19 MED ORDER — MIDAZOLAM HCL 2 MG/2ML IJ SOLN: 2.0000 mg | INTRAMUSCULAR | Status: DC | PRN

## 2012-10-19 MED ORDER — MAGNESIUM SULFATE 40 MG/ML IJ SOLN
4.0000 g | Freq: Once | INTRAMUSCULAR | Status: AC
  Administered 2012-10-19: 4 g via INTRAVENOUS
  Filled 2012-10-19: qty 100

## 2012-10-19 MED ORDER — SODIUM CHLORIDE 0.9 % IV SOLN
INTRAVENOUS | Status: DC
  Administered 2012-10-19: 1.1 [IU]/h via INTRAVENOUS
  Filled 2012-10-19: qty 1

## 2012-10-19 MED ORDER — SODIUM CHLORIDE 0.45 % IV SOLN: INTRAVENOUS | Status: DC

## 2012-10-19 MED ORDER — ASPIRIN 81 MG PO CHEW: 324.0000 mg | CHEWABLE_TABLET | Freq: Every day | ORAL | Status: DC

## 2012-10-19 SURGICAL SUPPLY — 91 items
ATTRACTOMAT 16X20 MAGNETIC DRP (DRAPES) ×2 IMPLANT
BAG DECANTER FOR FLEXI CONT (MISCELLANEOUS) ×2 IMPLANT
BANDAGE ELASTIC 4 VELCRO ST LF (GAUZE/BANDAGES/DRESSINGS) ×2 IMPLANT
BANDAGE ELASTIC 6 VELCRO ST LF (GAUZE/BANDAGES/DRESSINGS) ×2 IMPLANT
BANDAGE GAUZE ELAST BULKY 4 IN (GAUZE/BANDAGES/DRESSINGS) ×2 IMPLANT
BASKET HEART (ORDER IN 25'S) (MISCELLANEOUS) ×1
BASKET HEART (ORDER IN 25S) (MISCELLANEOUS) ×1 IMPLANT
BLADE STERNUM SYSTEM 6 (BLADE) ×2 IMPLANT
CANISTER SUCTION 2500CC (MISCELLANEOUS) ×2 IMPLANT
CANNULA EZ GLIDE AORTIC 21FR (CANNULA) ×2 IMPLANT
CANNULA VENOUS LOW PROF 34X46 (CANNULA) IMPLANT
CANNULA VESSEL W/WING W/VALVE (CANNULA) ×2 IMPLANT
CATH CPB KIT HENDRICKSON (MISCELLANEOUS) ×2 IMPLANT
CATH ROBINSON RED A/P 18FR (CATHETERS) ×4 IMPLANT
CATH THORACIC 36FR (CATHETERS) ×2 IMPLANT
CATH THORACIC 36FR RT ANG (CATHETERS) ×2 IMPLANT
CLIP FOGARTY SPRING 6M (CLIP) ×2 IMPLANT
CLIP TI MEDIUM 24 (CLIP) IMPLANT
CLIP TI WIDE RED SMALL 24 (CLIP) ×4 IMPLANT
CLOTH BEACON ORANGE TIMEOUT ST (SAFETY) ×2 IMPLANT
COVER SURGICAL LIGHT HANDLE (MISCELLANEOUS) ×2 IMPLANT
CRADLE DONUT ADULT HEAD (MISCELLANEOUS) ×2 IMPLANT
DRAPE CARDIOVASCULAR INCISE (DRAPES) ×1
DRAPE SLUSH/WARMER DISC (DRAPES) ×2 IMPLANT
DRAPE SRG 135X102X78XABS (DRAPES) ×1 IMPLANT
DRSG COVADERM 4X14 (GAUZE/BANDAGES/DRESSINGS) ×2 IMPLANT
ELECT REM PT RETURN 9FT ADLT (ELECTROSURGICAL) ×4
ELECTRODE REM PT RTRN 9FT ADLT (ELECTROSURGICAL) ×2 IMPLANT
GLOVE BIO SURGEON STRL SZ 6.5 (GLOVE) ×12 IMPLANT
GLOVE BIOGEL PI IND STRL 6.5 (GLOVE) ×4 IMPLANT
GLOVE BIOGEL PI IND STRL 7.0 (GLOVE) ×2 IMPLANT
GLOVE BIOGEL PI INDICATOR 6.5 (GLOVE) ×4
GLOVE BIOGEL PI INDICATOR 7.0 (GLOVE) ×2
GLOVE EUDERMIC 7 POWDERFREE (GLOVE) ×6 IMPLANT
GOWN PREVENTION PLUS XLARGE (GOWN DISPOSABLE) ×4 IMPLANT
GOWN STRL NON-REIN LRG LVL3 (GOWN DISPOSABLE) ×12 IMPLANT
HEMOSTAT POWDER SURGIFOAM 1G (HEMOSTASIS) ×10 IMPLANT
HEMOSTAT SURGICEL 2X14 (HEMOSTASIS) ×2 IMPLANT
INSERT FOGARTY XLG (MISCELLANEOUS) IMPLANT
KIT BASIN OR (CUSTOM PROCEDURE TRAY) ×2 IMPLANT
KIT ROOM TURNOVER OR (KITS) ×2 IMPLANT
KIT SUCTION CATH 14FR (SUCTIONS) ×4 IMPLANT
KIT VASOVIEW W/TROCAR VH 2000 (KITS) ×2 IMPLANT
LOOP VESSEL MAXI BLUE (MISCELLANEOUS) ×4 IMPLANT
MARKER GRAFT CORONARY BYPASS (MISCELLANEOUS) ×8 IMPLANT
NS IRRIG 1000ML POUR BTL (IV SOLUTION) ×10 IMPLANT
PACK OPEN HEART (CUSTOM PROCEDURE TRAY) ×2 IMPLANT
PAD ARMBOARD 7.5X6 YLW CONV (MISCELLANEOUS) ×4 IMPLANT
PAD DEFIB R2 (MISCELLANEOUS) ×2 IMPLANT
PAD ELECT DEFIB RADIOL ZOLL (MISCELLANEOUS) ×2 IMPLANT
PENCIL BUTTON HOLSTER BLD 10FT (ELECTRODE) ×2 IMPLANT
PUNCH AORTIC ROTATE 4.0MM (MISCELLANEOUS) IMPLANT
PUNCH AORTIC ROTATE 4.5MM 8IN (MISCELLANEOUS) ×2 IMPLANT
PUNCH AORTIC ROTATE 5MM 8IN (MISCELLANEOUS) IMPLANT
SPONGE GAUZE 4X4 12PLY (GAUZE/BANDAGES/DRESSINGS) ×14 IMPLANT
SPONGE LAP 18X18 X RAY DECT (DISPOSABLE) ×2 IMPLANT
SUT BONE WAX W31G (SUTURE) ×2 IMPLANT
SUT MNCRL AB 4-0 PS2 18 (SUTURE) ×2 IMPLANT
SUT PROLENE 3 0 SH DA (SUTURE) ×2 IMPLANT
SUT PROLENE 4 0 RB 1 (SUTURE) ×1
SUT PROLENE 4 0 SH DA (SUTURE) IMPLANT
SUT PROLENE 4-0 RB1 .5 CRCL 36 (SUTURE) ×1 IMPLANT
SUT PROLENE 6 0 C 1 30 (SUTURE) ×6 IMPLANT
SUT PROLENE 7 0 BV 1 (SUTURE) ×2 IMPLANT
SUT PROLENE 7 0 BV1 MDA (SUTURE) ×8 IMPLANT
SUT PROLENE 8 0 BV175 6 (SUTURE) ×4 IMPLANT
SUT SILK  1 MH (SUTURE)
SUT SILK 1 MH (SUTURE) IMPLANT
SUT STEEL 6MS V (SUTURE) IMPLANT
SUT STEEL STERNAL CCS#1 18IN (SUTURE) IMPLANT
SUT STEEL SZ 6 DBL 3X14 BALL (SUTURE) ×6 IMPLANT
SUT VIC AB 1 CTX 36 (SUTURE) ×2
SUT VIC AB 1 CTX36XBRD ANBCTR (SUTURE) ×2 IMPLANT
SUT VIC AB 2-0 CT1 27 (SUTURE)
SUT VIC AB 2-0 CT1 TAPERPNT 27 (SUTURE) IMPLANT
SUT VIC AB 2-0 CTX 27 (SUTURE) IMPLANT
SUT VIC AB 3-0 SH 27 (SUTURE) ×1
SUT VIC AB 3-0 SH 27X BRD (SUTURE) ×1 IMPLANT
SUT VIC AB 3-0 X1 27 (SUTURE) IMPLANT
SUT VICRYL 4-0 PS2 18IN ABS (SUTURE) ×2 IMPLANT
SUTURE E-PAK OPEN HEART (SUTURE) ×2 IMPLANT
SYSTEM SAHARA CHEST DRAIN ATS (WOUND CARE) ×2 IMPLANT
TAPE CLOTH SURG 4X10 WHT LF (GAUZE/BANDAGES/DRESSINGS) ×8 IMPLANT
TAPE PAPER 3X10 WHT MICROPORE (GAUZE/BANDAGES/DRESSINGS) ×2 IMPLANT
TOWEL OR 17X24 6PK STRL BLUE (TOWEL DISPOSABLE) ×4 IMPLANT
TOWEL OR 17X26 10 PK STRL BLUE (TOWEL DISPOSABLE) ×4 IMPLANT
TRAY FOLEY IC TEMP SENS 14FR (CATHETERS) ×2 IMPLANT
TUBE FEEDING 8FR 16IN STR KANG (MISCELLANEOUS) ×2 IMPLANT
TUBING INSUFFLATION 10FT LAP (TUBING) ×2 IMPLANT
UNDERPAD 30X30 INCONTINENT (UNDERPADS AND DIAPERS) ×2 IMPLANT
WATER STERILE IRR 1000ML POUR (IV SOLUTION) ×4 IMPLANT

## 2012-10-19 NOTE — Interval H&P Note (Signed)
History and Physical Interval Note:  10/19/2012 7:22 AM  [name]  has presented today for surgery, with the diagnosis of CAD  The various methods of treatment have been discussed with the patient and family. After consideration of risks, benefits and other options for treatment, the patient has consented to  Procedure(s): CORONARY ARTERY BYPASS GRAFTING (CABG) (N/A) as a surgical intervention .  The patient's history has been reviewed, patient examined, no change in status, stable for surgery.  I have reviewed the patient's chart and labs.  Questions were answered to the patient's satisfaction.     Westlyn Glaza C

## 2012-10-19 NOTE — Preoperative (Signed)
Beta Blockers   Reason not to administer Beta Blockers:Not Applicable 

## 2012-10-19 NOTE — Progress Notes (Signed)
Pt placed back on rate 12 d/t pt not warming up (temp slightly dropped).  RN placing warmer on pt.

## 2012-10-19 NOTE — Anesthesia Preprocedure Evaluation (Addendum)
Anesthesia Evaluation  Patient identified by MRN, date of birth, ID band Patient awake    Reviewed: Allergy & Precautions, H&P , NPO status , Patient's Chart, lab work & pertinent test results, reviewed documented beta blocker date and time   Airway Mallampati: II TM Distance: >3 FB Neck ROM: Full  Mouth opening: Limited Mouth Opening  Dental  (+) Teeth Intact and Dental Advisory Given   Pulmonary shortness of breath and with exertion, pneumonia -,          Cardiovascular hypertension, + CAD and + Past MI     Neuro/Psych    GI/Hepatic hiatal hernia, GERD-  Medicated,  Endo/Other  diabetes, Poorly Controlled, Type 2, Insulin Dependent  Renal/GU      Musculoskeletal  (+) Arthritis -,   Abdominal   Peds  Hematology   Anesthesia Other Findings   Reproductive/Obstetrics                           Anesthesia Physical Anesthesia Plan  ASA: III  Anesthesia Plan: General   Post-op Pain Management:    Induction: Intravenous  Airway Management Planned: Oral ETT  Additional Equipment: Arterial line, CVP, PA Cath and Ultrasound Guidance Line Placement  Intra-op Plan:   Post-operative Plan: Post-operative intubation/ventilation  Informed Consent: I have reviewed the patients History and Physical, chart, labs and discussed the procedure including the risks, benefits and alternatives for the proposed anesthesia with the patient or authorized representative who has indicated his/her understanding and acceptance.   Dental advisory given  Plan Discussed with: Anesthesiologist and Surgeon  Anesthesia Plan Comments:         Anesthesia Quick Evaluation

## 2012-10-19 NOTE — Op Note (Signed)
NAMEJAZLIN, [name]               ACCOUNT NO.:  [id number]  MEDICAL RECORD NO.:  [id number]  LOCATION:  2303                         FACILITY:  [facility]  PHYSICIAN:  Salvatore Decent. Dorris Fetch, M.D.DATE OF BIRTH:  06/22/1945  DATE OF PROCEDURE:  10/19/2012 DATE OF DISCHARGE:                              OPERATIVE REPORT   PREOPERATIVE DIAGNOSIS:  Severe 3-vessel coronary disease.  POSTOPERATIVE DIAGNOSIS:  Severe 3-vessel coronary disease.  PROCEDURE:  Median sternotomy, extracorporeal circulation, coronary artery bypass grafting x4 (left internal mammary artery to left anterior descending, saphenous vein graft to first diagonal, saphenous vein graft to obtuse marginal 1, saphenous vein graft to posterior descending), endoscopic vein harvest both leg.  SURGEON:  Salvatore Decent. Dorris Fetch, M.D.  ASSISTANT:  Doree Fudge, PA  ANESTHESIA:  General.  FINDINGS:  Majority of vein from left leg unusable. Vein from right leg satisfactory.  Diagonal and posterior descending poor quality target vessels.  LAD and OM1 fair quality target vessels.  CLINICAL NOTE:  [name] is a 67 year old woman with multiple medical problems, who recently had a non-Q-wave MI while hospitalized for pneumonia.  She had recovered from her respiratory illness and presented for elective cardiac catheterization.  There she was found to have severe 3-vessel coronary disease.  In further discussion with the patient, she does seem to be having exertional anginal-type symptoms. The patient was advised to have coronary artery bypass grafting.  The indications, risks, benefits, and alternatives were discussed in detail with the patient.  She understood and accepted the risks and agreed to proceed.  OPERATIVE NOTE:  [name] was brought to the preoperative holding area on October 19, 2012.  There the Anesthesia Service placed a Swan-Ganz catheter and arterial blood pressure monitoring line.  Intravenous antibiotics  were administered.  She was taken to the operating room, anesthetized, and intubated.  A Foley catheter was placed.  The chest, abdomen, and legs were prepped and draped in usual fashion.  An incision was made in the medial aspect of the left leg at the level of the knee. The greater saphenous vein was identified and was harvested endoscopically from midcalf to groin.  Simultaneously with this, a median sternotomy was performed and the left internal mammary artery was harvested.  2000 units of heparin was administered during the vessel harvest.  After the vein was removed from the leg, it was clear that the majority of this vein was sclerotic.  It was not acceptable for use as a bypass graft.  It may have in fact been an accessory saphenous branch as the upper portion of the thigh was normal caliber.  Rather than further exploring the left leg, decision was made to make an incision in the right leg.  Below the knee the vein was small.  Above the knee, the vein appeared to be suitable and it was harvested endoscopically from the right thigh and turned out to be a satisfactory conduit.  The full heparin dose was administered.  The pericardium was opened. The ascending aorta was inspected.  It was of normal size with no palpable atherosclerotic disease.  After confirming adequate anticoagulation with ACT measurement, the aorta was cannulated via concentric 2-0 Ethibond pledgeted  pursestring sutures.  A dual-stage venous cannula was placed via pursestring suture in the right atrial appendage.  Cardiopulmonary bypass was instituted and the patient was cooled to 32 degrees Celsius.  The coronary arteries were inspected and anastomotic sites were chosen.  The conduits were inspected and cut to length.  A foam pad was placed in the pericardium to insulate the heart and protect the left phrenic nerve.  A temperature probe was placed in myocardial septum and a cardioplegic cannula was placed in the  ascending aorta.  The aorta was crossclamped.  The left ventricle was emptied via aortic root vent.  Cardiac arrest then was achieved with combination of cold antegrade blood cardioplegia and topical iced saline.  One liter of cardioplegia was administered.  There was a rapid diastolic arrest and myocardial septal cooling to 11 degrees Celsius.  The following distal anastomoses were performed.  First, a reversed saphenous vein graft was placed end-to-side to the posterior descending branch of the right coronary.  This was a 1 mm heavily diseased poor quality target vessel.  The vein was of good quality.  It was anastomosed end-to-side with a running 7-0 Prolene suture.  All anastomoses were probed proximally and distally to ensure patency.  The probe passed easily.  Cardioplegia was administered to assess flow and hemostasis.  Flow was marginal.  Hemostasis was good.  Next, a reversed saphenous vein graft was placed end-to-side to the first diagonal branch of the LAD.  This was likewise a 1 mm calcified diffusely diseased poor quality target vessel.  The vein was of good quality.  It was anastomosed end-to-side with a running 7-0 Prolene suture.  With cardioplegia administration, there was satisfactory flow and good hemostasis.  Next, a reversed saphenous vein graft was placed end-to-side to the obtuse marginal.  This vessel likewise was diffusely diseased but was larger, did accept a 1.5 mm probe and was a fair quality target.  The vein was anastomosed end-to-side with a running 7-0 Prolene suture. With cardioplegia administration, there was good flow and good hemostasis.  Additional cardioplegia was administered down the aortic root.  Next, the left internal mammary artery was brought through a window in the pericardium.  The distal end was beveled and was anastomosed end-to-side to the LAD.  The LAD likewise had diffuse plaque, but accepted 1.5 mm probe proximally as well as  distally, although at the apex only a 1 mm probe would pass.  The mammary was good quality. It was anastomosed end-to- side to the LAD with a running 8-0 Prolene suture.  At the completion of the mammary to LAD anastomosis, the bulldog clamp was briefly removed. Immediate and rapid septal rewarming was noted.  The bulldog clamp was replaced.  The mammary pedicle was tacked to the epicardial surface of the heart with 6-0 Prolene sutures.  Additional cardioplegia was administered.  The cardioplegic cannula then was removed from the ascending aorta.  The vein grafts were cut to length.  The proximal vein graft anastomoses were performed to 4.5 mm punch aortotomies with running 6-0 Prolene sutures.  At completion of final proximal anastomosis, the patient was placed in Trendelenburg position.  Lidocaine was administered.  The aortic root was de-aired and the aortic crossclamp was removed.  Total crossclamp time was 86 minutes.  The patient spontaneously resumed sinus rhythm and did not require defibrillation.  A test dose of protamine was administered and was well tolerated.  The atrial and aortic cannulae were removed.  The remainder of the  protamine was administered without incident.  The chest was irrigated with warm saline.  Hemostasis was achieved.  Left pleural and mediastinal chest tubes were placed through separate subcostal incisions.  The pericardium was reapproximated with interrupted 3-0 silk sutures.  It came together easily without tension or kinking the underlying grafts.  The sternum was closed with interrupted heavy gauge double stainless steel wires.  The pectoralis fascia, subcutaneous tissue, and skin were closed in standard fashion as were the leg incisions.  All sponge, needle, and instrument counts were correct at end of the procedure.  The patient was taken from the operating room to the surgical intensive care unit in good condition.     Salvatore Decent Dorris Fetch,  M.D.     SCH/MEDQ  D:  10/19/2012  T:  10/19/2012  Job:  [id number]

## 2012-10-19 NOTE — Progress Notes (Addendum)
Inpatient Diabetes Program Recommendations  AACE/ADA: New Consensus Statement on Inpatient Glycemic Control (2013)  Target Ranges:  Prepandial:   less than 140 mg/dL      Peak postprandial:   less than 180 mg/dL (1-2 hours)      Critically ill patients:  140 - 180 mg/dL   Reason for Visit: Spoke to patient's family regarding insulin pump settings.  Basal rates are: 12a-0.3 units/hr   6a-0.75 units/hr 12p-0.65 units/hr   3p-0.425 units/hr   6p-0.5 units/hr  Total basal insulin in 24 hours=12.525 units Her CHO coverage is 1 units for every 10 grams CHO and she covers 1 unit for every 50 mg/dL greater than 956 mg/dL.  Patient is sensitive to insulin and has Type 1 diabetes.  Consider continuing insulin drip until patient is able to restart and self-manage insulin pump.    If insulin drip is weaned and insulin pump is not resumed, patient will need basal insulin 2 hours prior to discontinuation of insulin drip (may consider Levemir 13 units, sensitive Novolog correction q 4 hours and 3 units Novolog meal coverage).   Please call after hour pager if there are questions for the Diabetes Coordinator [phone]).

## 2012-10-19 NOTE — Procedures (Signed)
Extubation Procedure Note  Patient Details:   Name: [name] DOB: 12-05-1945 MRN: [id number]   Airway Documentation:  Airway 8 mm (Active)  Secured at (cm) 22 cm 10/19/2012  7:54 PM  Measured From Lips 10/19/2012  7:54 PM  Secured Location Right 10/19/2012  5:50 PM  Secured By [employer] 10/19/2012  7:54 PM  Tube Holder Repositioned Yes 10/19/2012  7:54 PM  Cuff Pressure (cm H2O) 22 cm H2O 10/19/2012  5:50 PM  Site Condition Cool;Dry 10/19/2012  5:50 PM    Evaluation  O2 sats: stable throughout Complications: No apparent complications Patient did tolerate procedure well. Bilateral Breath Sounds: Clear;Diminished   Yes NIF -28 / FVC 860 cc Charletta Cousin Evette 10/19/2012, 9:07 PM

## 2012-10-19 NOTE — Progress Notes (Signed)
S/p CABG x 4  Intubated but starting to wake up  BP 114/52  Pulse 90  Temp(Src) 96.6 F (35.9 C) (Oral)  Resp 14  Ht 5\' 4"  (1.626 m)  Wt 153 lb 7 oz (69.6 kg)  BMI 26.32 kg/m2  SpO2 100%  CI 2.01   Intake/Output Summary (Last 24 hours) at 10/19/12 1808 Last data filed at 10/19/12 1800  Gross per 24 hour  Intake   3314 ml  Output   2895 ml  Net    419 ml    Minimal CT output  Doing well early postop

## 2012-10-19 NOTE — Progress Notes (Signed)
SICU wean protocol initiated.  Pt is waking up, follows commands.  No distress noted.

## 2012-10-19 NOTE — Progress Notes (Signed)
Inpatient Diabetes Program Recommendations  AACE/ADA: New Consensus Statement on Inpatient Glycemic Control (2013)  Target Ranges:  Prepandial:   less than 140 mg/dL      Peak postprandial:   less than 180 mg/dL (1-2 hours)      Critically ill patients:  140 - 180 mg/dL   Reason for Visit: Note patient has insulin pump.  Called into OR to make sure that insulin pump not still connected.  Note patient is on insulin drip during CABG per protocol.  RN states that she did not see insulin pump when patient was prepped however she will verify with CRNA that insulin pump is off.    Note: Will follow.  Recommend continuing insulin drip until patient is able to restart insulin pump (i.e. Alert and oriented and able to manage insulin pump independently).  Unclear of what insulin pump settings are.

## 2012-10-19 NOTE — OR Nursing (Signed)
Noted post-op patients bilateral feet cool to touch, dusky in color, bilateral dorsalis pedis pulses palpable but weak; ace wraps removed and rechecked with no change in color. Doree Fudge PA notified; no new orders will continue to monitor.

## 2012-10-19 NOTE — Brief Op Note (Addendum)
10/18/2012 - 10/19/2012  12:52 PM  PATIENT:  Flora Lipps  67 y.o. female  PRE-OPERATIVE DIAGNOSIS:  Coronary Artery Disease  POST-OPERATIVE DIAGNOSIS:  Coronary Artery Disease  PROCEDURE: CORONARY ARTERY BYPASS GRAFTING (CABG) x 4 (LIMA to LAD, SVG to DIAGONAL, SVG to OM, SVG to PDA) with EVH from right thigh and left thigh and lower leg  SURGEON:  Surgeon(s) and Role:    * Loreli Slot, MD - Primary  PHYSICIAN ASSISTANT: Doree Fudge PA-C   ANESTHESIA:   general  EBL:  Total I/O In: -  Out: 410 [Urine:410]  BLOOD ADMINISTERED:Two CC PRBC  DRAINS:Chest Tube(s) in the Mediastinal and pleural spaces   COUNTS CORRECT :  YES  PLAN OF CARE: Admit to inpatient   PATIENT DISPOSITION:  ICU - intubated and hemodynamically stable.   Delay start of Pharmacological VTE agent (>24hrs) due to surgical blood loss or risk of bleeding: yes   PRE OP WEIGHT: 69 kg  XC = 86 min  CPB = 130 min- off with no inotropes  Diagonal and PDA poor quality OM and LAD fair quality

## 2012-10-19 NOTE — Transfer of Care (Signed)
Immediate Anesthesia Transfer of Care Note  Patient: [name]  Procedure(s) Performed: Procedure(s): CORONARY ARTERY BYPASS GRAFTING (CABG) (N/A)  Patient Location: PACU  Anesthesia Type:General  Level of Consciousness: Patient remains intubated per anesthesia plan  Airway & Oxygen Therapy: Patient remains intubated per anesthesia plan and Patient placed on Ventilator (see vital sign flow sheet for setting)  Post-op Assessment: Report given to PACU RN and Post -op Vital signs reviewed and stable  Post vital signs: Reviewed and stable  Complications: No apparent anesthesia complications

## 2012-10-19 NOTE — H&P (View-Only) (Signed)
Reason for Consult:3 vessel CAD Referring Physician: Dr. Lonell [name] Janet is an 67 y.o. female.  HPI: 67 yo WF with no prior cardiac history. [name] was hospitalized in May with a respiratory illness- "double pneumonia". During that admission [name] had a positive troponin of 12. Cath deferred due to an inability to tolerate the procedure. Today [name] presented for a cardiac catheterization and was found to have severe 3 vessel CAD.  [name] initially denied CP. But on further questioning [name] has been having some CP, which [name] attributed to reflux/ indigestion. [name] also gets SOB with minimal exertion.   Past Medical History  Diagnosis Date  . Leukopenia 2012    s/p bone marrow biopsy, Dr. Sherrlyn Hock  . Cholelithiasis   . GERD (gastroesophageal reflux disease)   . Hypertension   . Hyperlipidemia   . Esophageal stricture   . Chronic diastolic CHF (congestive heart failure)   . CAD (coronary artery disease)     a. 09/2012 Cath: LM nl, LAD 95p, 62m, LCX 64m, OM2 50, RCA 100.  Marland Kitchen Hypokalemia   . Herniated disc   . Pneumonia 2013; 08/2012    "one lung; double" (10/18/2012)  . Exertional shortness of breath   . Type I diabetes mellitus     "dx'd in 1957" (10/18/2012)  . History of pancytopenia   . H/O hiatal hernia   . Rheumatoid arthritis(714.0)   . NSTEMI (non-ST elevated myocardial infarction) 08/2012    "mild" (10/18/2012)    Past Surgical History  Procedure Laterality Date  . Esophagogastroduodenoscopy  2012    Dr. Skeet Simmer  . Tubal ligation  1970  . Cataract extraction w/ intraocular lens implant Right 2010  . Cardiac catheterization  10/18/2012    "first one was today" (10/18/2012)  . Esophageal dilation      "3 or 4 times" (10/18/2012)    Family History  Problem Relation Age of Onset  . Diabetes Mother   . Arthritis Mother   . Diabetes Father   . Arthritis Father   . Bone cancer Sister     Social History:  reports that [name] quit smoking about 18 years ago. Her smoking use included  Cigarettes. [name] has a 30 pack-year smoking history. [name] has never used smokeless tobacco. [name] reports that [name] does not drink alcohol or use illicit drugs.  Allergies:  Allergies  Allergen Reactions  . Codeine     nausea    Medications:  Prior to Admission:  Prescriptions prior to admission  Medication Sig Dispense Refill  . amLODipine (NORVASC) 10 MG tablet Take 1 tablet (10 mg total) by mouth daily.  90 tablet  3  . aspirin 81 MG tablet Take 81 mg by mouth daily.        Marland Kitchen atorvastatin (LIPITOR) 20 MG tablet Take 1 tablet (20 mg total) by mouth daily.  90 tablet  2  . Calcium Carbonate Antacid (TUMS PO) Take 4 tablets by mouth daily.        . Cholecalciferol (VITAMIN D) 2000 UNITS CAPS Take 1 capsule by mouth daily.      Marland Kitchen Dextromethorphan-Guaifenesin (MUCINEX DM PO) Take by mouth 2 (two) times daily.      . Dihydroxyaluminum Sod Carb (ROLAIDS PO) Take by mouth as needed.        . etanercept (ENBREL) 25 MG injection Inject 25 mg into the skin once a week.      . folic acid (FOLVITE) 1 MG tablet Take 1 tablet (1 mg total) by mouth  daily.  90 tablet  3  . furosemide (LASIX) 40 MG tablet Take 1 tablet (40 mg total) by mouth 2 (two) times daily.  60 tablet  5  . ibuprofen (ADVIL,MOTRIN) 800 MG tablet Take 1 tablet (800 mg total) by mouth 3 (three) times daily as needed.  160 tablet  0  . insulin lispro (HUMALOG) 100 UNIT/ML injection As directed via Insulin pump  30 mL  11  . iron polysaccharides (NIFEREX) 150 MG capsule Take 150 mg by mouth 2 (two) times daily.      Boris Lown Oil 300 MG CAPS Take by mouth daily.      Marland Kitchen losartan (COZAAR) 100 MG tablet Take 1 tablet (100 mg total) by mouth daily.  90 tablet  3  . methotrexate (RHEUMATREX) 2.5 MG tablet Take 4 mg by mouth once a week.       . metoprolol (LOPRESSOR) 100 MG tablet Take 1 tablet (100 mg total) by mouth 2 (two) times daily.  180 tablet  1  . Multiple Vitamin (MULTIVITAMIN) tablet Take 1 tablet by mouth daily.        Marland Kitchen NITROSTAT  0.4 MG SL tablet 0.4 mg every 5 (five) minutes as needed.       . pantoprazole (PROTONIX) 40 MG tablet Take one tablet by mouth twice daily.  60 tablet  6  . pyridOXINE (VITAMIN B-6) 100 MG tablet Take 100 mg by mouth daily.        . Triamcinolone Acetonide (TRIAMCINOLONE 0.1 % CREAM : EUCERIN) CREA Apply 1 application topically 2 (two) times daily as needed.  1 each  3  . zoledronic acid (RECLAST) 5 MG/100ML SOLN Inject 5 mg into the vein once.        Results for orders placed during the hospital encounter of 10/18/12 (from the past 48 hour(s))  GLUCOSE, CAPILLARY     Status: Abnormal   Collection Time    10/18/12 12:33 PM      Result Value Range   Glucose-Capillary 63 (*) 70 - 99 mg/dL   Comment 1 Notify RN    GLUCOSE, CAPILLARY     Status: Abnormal   Collection Time    10/18/12  5:34 PM      Result Value Range   Glucose-Capillary 430 (*) 70 - 99 mg/dL    No results found.  Review of Systems  Constitutional: Positive for malaise/fatigue. Negative for fever and chills.  Respiratory: Positive for cough and shortness of breath.   Cardiovascular: Positive for chest pain ("indigestion"). Negative for palpitations, orthopnea, claudication and leg swelling.  Musculoskeletal: Positive for joint pain.  All other systems reviewed and are negative.   Blood pressure 169/69, pulse 66, temperature 97.4 F (36.3 C), temperature source Oral, resp. rate 16, height 5\' 4"  (1.626 m), weight 159 lb (72.122 kg), SpO2 95.00%. Physical Exam  Vitals reviewed. Constitutional: [name] is oriented to person, place, and time. [name] appears well-developed and well-nourished. No distress.  HENT:  Head: Normocephalic and atraumatic.  Eyes: EOM are normal. Pupils are equal, round, and reactive to light.  Neck: Neck supple. No thyromegaly present.  No bruits  Cardiovascular: Normal rate, regular rhythm, normal heart sounds and intact distal pulses.  Exam reveals no gallop and no friction rub.   No murmur  heard. Respiratory: Effort normal and breath sounds normal.  GI: Soft. There is no tenderness.  Musculoskeletal: [name] exhibits no edema.  Arthritic changes in hands  Lymphadenopathy:    [name] has no cervical adenopathy.  Neurological: [name] is alert and oriented to person, place, and time. No cranial nerve deficit.  Skin: Skin is warm and dry.    Assessment/Plan: 67 yo with multiple CRF who had a NQWMI about a month ago when hospitalized with a respiratory illness. [name] now has been found to have severe 3 vessel CAD with normal LV function. CABG indicated for 3 vessel CAD.  I discussed with the patient and her family the general nature of the procedure, the need for general anesthesia, and the incisions to be used. We discussed the expected hospital stay, overall recovery and short and long term outcomes. They understand the risks include but are not limited to death, stroke, MI, DVT/PE, bleeding, possible need for transfusion, infections, cardiac arrhythmias, and other organ system dysfunction including respiratory, renal, or GI complications. [name] accepts the risks and agrees to proceed.  For CABG in AM  Her CBG is currently 460- [name] will bolus herself with the insulin pump. We will check her again around 8 o'clock. If still > 200 will start insulin gtt and glucommander Marietta Sikkema C 10/18/2012, 5:51 PM

## 2012-10-19 NOTE — OR Nursing (Signed)
2300 called at 1340 for 45 minute call; spoke with Lynnea Ferrier.  2300 called at 1404 for 20 minute call; spoke with Lynnea Ferrier.  Volunteer called at 1415 to notify family off pump and closing of procedure; spoke with Tyler Aas.

## 2012-10-19 NOTE — Progress Notes (Signed)
Pts. Blood sugar was 278 at 2000, pt.hasinsulin pump called and paged Dr. Terressa Koyanagi regarding blood sugar and about pt. whether to be place on insulin drip as ordered verbally by dr. Dorris Fetch as per reported by nurse. Pt. Does not want to be placed in insulin drip  As verbalized bec. She does not want to be stuck every hour and she's been in a lot stress and wants sleep. Dr. Terressa Koyanagi aware of pts. Wishes with order to let pt. Give her own insulin bolus via pump. At 2020 pt. Gave herself a 2 units bolus and will rechecked BS and to follow up with Dr. Terressa Koyanagi.

## 2012-10-19 NOTE — Anesthesia Procedure Notes (Signed)
Procedure Name: Intubation Date/Time: 10/19/2012 7:50 AM Performed by: Jerilee Hoh Pre-anesthesia Checklist: Patient identified, Emergency Drugs available, Suction available and Patient being monitored Patient Re-evaluated:Patient Re-evaluated prior to inductionOxygen Delivery Method: Circle system utilized Preoxygenation: Pre-oxygenation with 100% oxygen Intubation Type: IV induction Ventilation: Mask ventilation without difficulty Laryngoscope Size: Mac and 3 Grade View: Grade III Tube type: Oral Tube size: 8.0 mm Number of attempts: 2 Airway Equipment and Method: Bougie stylet Placement Confirmation: ETT inserted through vocal cords under direct vision,  positive ETCO2 and breath sounds checked- equal and bilateral Secured at: 22 cm Tube secured with: Tape Dental Injury: Teeth and Oropharynx as per pre-operative assessment  Difficulty Due To: Difficulty was anticipated, Difficult Airway- due to limited oral opening and Difficult Airway- due to anterior larynx Comments: Easy mask airway.  DL by CRNA.  Slight view of epiglottis only.  Attempted to pass bougie, but was not successful.  DL by MD.  Norberto Sorenson passed with some difficulty and ETT placed over bougie without difficulty.  +EtCO2,  +BBS.  Atraumatic intubation.

## 2012-10-20 ENCOUNTER — Inpatient Hospital Stay (HOSPITAL_COMMUNITY): Payer: Medicare Other

## 2012-10-20 LAB — CBC
HCT: 23.5 % — ABNORMAL LOW (ref 36.0–46.0)
HCT: 23.6 % — ABNORMAL LOW (ref 36.0–46.0)
Hemoglobin: 8 g/dL — ABNORMAL LOW (ref 12.0–15.0)
MCH: 31.2 pg (ref 26.0–34.0)
MCV: 89.4 fL (ref 78.0–100.0)
MCV: 90.4 fL (ref 78.0–100.0)
Platelets: 94 10*3/uL — ABNORMAL LOW (ref 150–400)
RBC: 2.61 MIL/uL — ABNORMAL LOW (ref 3.87–5.11)
RDW: 15 % (ref 11.5–15.5)
RDW: 15.3 % (ref 11.5–15.5)
WBC: 5 10*3/uL (ref 4.0–10.5)

## 2012-10-20 LAB — GLUCOSE, CAPILLARY
Glucose-Capillary: 103 mg/dL — ABNORMAL HIGH (ref 70–99)
Glucose-Capillary: 123 mg/dL — ABNORMAL HIGH (ref 70–99)
Glucose-Capillary: 124 mg/dL — ABNORMAL HIGH (ref 70–99)
Glucose-Capillary: 124 mg/dL — ABNORMAL HIGH (ref 70–99)
Glucose-Capillary: 160 mg/dL — ABNORMAL HIGH (ref 70–99)
Glucose-Capillary: 77 mg/dL (ref 70–99)
Glucose-Capillary: 80 mg/dL (ref 70–99)
Glucose-Capillary: 89 mg/dL (ref 70–99)

## 2012-10-20 LAB — BASIC METABOLIC PANEL
BUN: 11 mg/dL (ref 6–23)
Calcium: 7.4 mg/dL — ABNORMAL LOW (ref 8.4–10.5)
Creatinine, Ser: 0.55 mg/dL (ref 0.50–1.10)
GFR calc Af Amer: >90 mL/min (ref >90)

## 2012-10-20 LAB — CREATININE, SERUM
Creatinine, Ser: 0.69 mg/dL (ref 0.50–1.10)
GFR calc non Af Amer: 87 mL/min — ABNORMAL LOW (ref >90)

## 2012-10-20 LAB — MAGNESIUM
Magnesium: 3.1 mg/dL — ABNORMAL HIGH (ref 1.5–2.5)
Magnesium: 2.8 mg/dL — ABNORMAL HIGH (ref 1.5–2.5)

## 2012-10-20 LAB — POCT I-STAT, CHEM 8
BUN: 12 mg/dL (ref 6–23)
Calcium, Ion: 1.09 mmol/L — ABNORMAL LOW (ref 1.13–1.30)
Creatinine, Ser: 0.8 mg/dL (ref 0.50–1.10)
Hemoglobin: 7.1 g/dL — ABNORMAL LOW (ref 12.0–15.0)
Sodium: 140 mEq/L (ref 135–145)
TCO2: 22 mmol/L (ref 0–100)

## 2012-10-20 MED ORDER — ALBUMIN HUMAN 5 % IV SOLN
12.5000 g | Freq: Once | INTRAVENOUS | Status: AC
  Administered 2012-10-20: 12.5 g via INTRAVENOUS

## 2012-10-20 MED ORDER — INSULIN DETEMIR 100 UNIT/ML ~~LOC~~ SOLN
25.0000 [IU] | Freq: Every day | SUBCUTANEOUS | Status: DC
  Administered 2012-10-20: 25 [IU] via SUBCUTANEOUS
  Filled 2012-10-20 (×2): qty 0.25

## 2012-10-20 MED ORDER — FUROSEMIDE 10 MG/ML IJ SOLN
40.0000 mg | Freq: Once | INTRAMUSCULAR | Status: AC
  Administered 2012-10-20: 40 mg via INTRAVENOUS
  Filled 2012-10-20: qty 4

## 2012-10-20 MED ORDER — INSULIN ASPART 100 UNIT/ML ~~LOC~~ SOLN
0.0000 [IU] | SUBCUTANEOUS | Status: DC
  Administered 2012-10-20 – 2012-10-21 (×2): 2 [IU] via SUBCUTANEOUS

## 2012-10-20 MED ORDER — INSULIN ASPART 100 UNIT/ML ~~LOC~~ SOLN
3.0000 [IU] | Freq: Three times a day (TID) | SUBCUTANEOUS | Status: DC
  Administered 2012-10-21: 3 [IU] via SUBCUTANEOUS

## 2012-10-20 MED ORDER — POTASSIUM CHLORIDE 10 MEQ/50ML IV SOLN
10.0000 meq | INTRAVENOUS | Status: AC
  Administered 2012-10-20 (×3): 10 meq via INTRAVENOUS
  Filled 2012-10-20: qty 200

## 2012-10-20 NOTE — Progress Notes (Signed)
Patient ambulated 70 feet on room air, POx >92%, and HR paced at 90 bpm. Took some brief stops during the walk because her "legs felt weak"; breathing remained only mildly labored but was able to talk during walk. Returned patient back to bed. Pain control is adequate for now.

## 2012-10-20 NOTE — Plan of Care (Signed)
Problem: Phase II Progression Outcomes Goal: Tolerates liquids without nausea/vomiting Outcome: Progressing Reglan started for PONV Goal: Patient extubated within - Outcome: Completed/Met Date Met:  10/20/12 6 hours

## 2012-10-20 NOTE — Progress Notes (Signed)
1 Day Post-Op Procedure(s) (LRB): CORONARY ARTERY BYPASS GRAFTING (CABG) (N/A) Subjective: Some discomfort  Objective: Vital signs in last 24 hours: Temp:  [96.4 F (35.8 C)-98.6 F (37 C)] 98.4 F (36.9 C) (06/21 0800) Pulse Rate:  [69-90] 90 (06/21 0800) Cardiac Rhythm:  [-] Atrial paced (06/21 0400) Resp:  [12-20] 14 (06/21 0800) BP: (91-114)/(35-52) 110/42 mmHg (06/21 0800) SpO2:  [91 %-100 %] 94 % (06/21 0800) Arterial Line BP: (81-130)/(33-58) 112/41 mmHg (06/21 0800) FiO2 (%):  [39.9 %-50.3 %] 40.4 % (06/20 2100) Weight:  [169 lb 8.5 oz (76.9 kg)] 169 lb 8.5 oz (76.9 kg) (06/21 0545)  Hemodynamic parameters for last 24 hours: PAP: (15-39)/(3-21) 39/14 mmHg CO:  [3.2 L/min-5.2 L/min] 4.7 L/min CI:  [1.8 L/min/m2-3 L/min/m2] 2.7 L/min/m2  Intake/Output from previous day: 06/20 0701 - 06/21 0700 In: 5224.9 [P.O.:100; I.V.:3964.9; Blood:300; IV Piggyback:860] Out: 2955 [Urine:1415; Blood:1150; Chest Tube:390] Intake/Output this shift: Total I/O In: 130 [P.O.:60; I.V.:20; IV Piggyback:50] Out: 35 [Urine:35]  General appearance: alert and no distress Neurologic: intact Heart: regular rate and rhythm Lungs: diminished breath sounds bibasilar Abdomen: normal findings: soft, non-tender  Lab Results:  Recent Labs  10/19/12 2045 10/19/12 2052 10/20/12 0445  WBC 5.6  --  5.8  HGB 8.8* 8.5* 8.2*  HCT 24.8* 25.0* 23.5*  PLT 81*  --  94*   BMET:  Recent Labs  10/19/12 0540  10/19/12 2052 10/20/12 0445  NA 137  < > 139 138  K 3.7  < > 4.3 3.8  CL 102  --  110 109  CO2 29  --   --  22  GLUCOSE 243*  < > 154* 139*  BUN 11  --  7 11  CREATININE 0.59  --  0.60 0.55  CALCIUM 9.0  --   --  7.4*  < > = values in this interval not displayed.  PT/INR:  Recent Labs  10/19/12 1500  LABPROT 17.6*  INR 1.49   ABG    Component Value Date/Time   PHART 7.322* 10/19/2012 2215   HCO3 19.5* 10/19/2012 2215   TCO2 21 10/19/2012 2215   ACIDBASEDEF 6.0* 10/19/2012 2215   O2SAT 99.0 10/19/2012 2215   CBG (last 3)   Recent Labs  10/20/12 0414 10/20/12 0515 10/20/12 0605  GLUCAP 123* 80 124*    Assessment/Plan: S/P Procedure(s) (LRB): CORONARY ARTERY BYPASS GRAFTING (CABG) (N/A) See progression orders  CV- good hemodynamics- dc swan  RESP- pulmonary hygiene, IS  RENAL- diurese, supplement K  ENDO- CBG- OK, transition to levimir/ SSI  DC CT  OOB   LOS: 2 days    [name] C 10/20/2012

## 2012-10-20 NOTE — Progress Notes (Signed)
Resting  BP 112/42  Pulse 93  Temp(Src) 97.5 F (36.4 C) (Oral)  Resp 16  Ht 5\' 4"  (1.626 m)  Wt 169 lb 8.5 oz (76.9 kg)  BMI 29.09 kg/m2  SpO2 98%   Intake/Output Summary (Last 24 hours) at 10/20/12 1644 Last data filed at 10/20/12 1600  Gross per 24 hour  Intake 2593.4 ml  Output   1825 ml  Net  768.4 ml    Hgb 7.1 by I stat- CBC pending  Will give additional lasix Transfuse 1 unit if hgb < 7.5 on CBC

## 2012-10-21 ENCOUNTER — Inpatient Hospital Stay (HOSPITAL_COMMUNITY): Payer: Medicare Other

## 2012-10-21 LAB — GLUCOSE, CAPILLARY
Glucose-Capillary: 92 mg/dL (ref 70–99)
Glucose-Capillary: 211 mg/dL — ABNORMAL HIGH (ref 70–99)
Glucose-Capillary: 113 mg/dL — ABNORMAL HIGH (ref 70–99)

## 2012-10-21 LAB — BASIC METABOLIC PANEL
BUN: 14 mg/dL (ref 6–23)
Calcium: 7.6 mg/dL — ABNORMAL LOW (ref 8.4–10.5)
GFR calc Af Amer: >90 mL/min (ref >90)
GFR calc non Af Amer: 88 mL/min — ABNORMAL LOW (ref >90)
Glucose, Bld: 92 mg/dL (ref 70–99)

## 2012-10-21 LAB — CBC
MCH: 31 pg (ref 26.0–34.0)
MCHC: 33.6 g/dL (ref 30.0–36.0)
Platelets: 86 10*3/uL — ABNORMAL LOW (ref 150–400)
RDW: 15.5 % (ref 11.5–15.5)

## 2012-10-21 MED ORDER — ALUM & MAG HYDROXIDE-SIMETH 200-200-20 MG/5ML PO SUSP
15.0000 mL | ORAL | Status: DC | PRN
  Administered 2012-10-21 – 2012-10-22 (×2): 30 mL via ORAL
  Filled 2012-10-21 (×2): qty 30

## 2012-10-21 MED ORDER — INSULIN DETEMIR 100 UNIT/ML ~~LOC~~ SOLN
20.0000 [IU] | Freq: Every day | SUBCUTANEOUS | Status: DC
  Administered 2012-10-21 – 2012-10-23 (×3): 20 [IU] via SUBCUTANEOUS
  Filled 2012-10-21 (×3): qty 0.2

## 2012-10-21 MED ORDER — FUROSEMIDE 40 MG PO TABS
40.0000 mg | ORAL_TABLET | Freq: Every day | ORAL | Status: DC
  Filled 2012-10-21 (×2): qty 1

## 2012-10-21 MED ORDER — MAGNESIUM HYDROXIDE 400 MG/5ML PO SUSP: 30.0000 mL | Freq: Every day | ORAL | Status: DC | PRN

## 2012-10-21 MED ORDER — ZOLPIDEM TARTRATE 5 MG PO TABS: 5.0000 mg | ORAL_TABLET | Freq: Every evening | ORAL | Status: DC | PRN

## 2012-10-21 MED ORDER — SODIUM CHLORIDE 0.9 % IJ SOLN: 3.0000 mL | INTRAMUSCULAR | Status: DC | PRN

## 2012-10-21 MED ORDER — SODIUM CHLORIDE 0.9 % IV SOLN: 250.0000 mL | INTRAVENOUS | Status: DC | PRN

## 2012-10-21 MED ORDER — METHOTREXATE 2.5 MG PO TABS: 10.0000 mg | ORAL_TABLET | ORAL | Status: DC

## 2012-10-21 MED ORDER — ALPRAZOLAM 0.25 MG PO TABS: 0.2500 mg | ORAL_TABLET | Freq: Four times a day (QID) | ORAL | Status: DC | PRN

## 2012-10-21 MED ORDER — POTASSIUM CHLORIDE 10 MEQ/50ML IV SOLN
10.0000 meq | INTRAVENOUS | Status: AC
  Administered 2012-10-21 (×3): 10 meq via INTRAVENOUS
  Filled 2012-10-21: qty 150

## 2012-10-21 MED ORDER — INSULIN ASPART 100 UNIT/ML ~~LOC~~ SOLN
4.0000 [IU] | Freq: Three times a day (TID) | SUBCUTANEOUS | Status: DC
  Administered 2012-10-21: 4 [IU] via SUBCUTANEOUS
  Administered 2012-10-21: 18:00:00 via SUBCUTANEOUS
  Administered 2012-10-22 – 2012-10-23 (×4): 4 [IU] via SUBCUTANEOUS

## 2012-10-21 MED ORDER — FUROSEMIDE 10 MG/ML IJ SOLN
40.0000 mg | Freq: Once | INTRAMUSCULAR | Status: AC
  Administered 2012-10-21: 40 mg via INTRAVENOUS
  Filled 2012-10-21: qty 4

## 2012-10-21 MED ORDER — INSULIN ASPART 100 UNIT/ML ~~LOC~~ SOLN
0.0000 [IU] | Freq: Three times a day (TID) | SUBCUTANEOUS | Status: DC
  Administered 2012-10-21: 5 [IU] via SUBCUTANEOUS
  Administered 2012-10-22: 3 [IU] via SUBCUTANEOUS
  Administered 2012-10-22: 5 [IU] via SUBCUTANEOUS
  Administered 2012-10-23: 3 [IU] via SUBCUTANEOUS

## 2012-10-21 MED ORDER — SODIUM CHLORIDE 0.9 % IJ SOLN
3.0000 mL | Freq: Two times a day (BID) | INTRAMUSCULAR | Status: DC
  Administered 2012-10-21 – 2012-10-22 (×4): 3 mL via INTRAVENOUS

## 2012-10-21 MED ORDER — POTASSIUM CHLORIDE CRYS ER 20 MEQ PO TBCR
20.0000 meq | EXTENDED_RELEASE_TABLET | Freq: Two times a day (BID) | ORAL | Status: DC
  Administered 2012-10-21: 20 meq via ORAL
  Filled 2012-10-21 (×3): qty 1

## 2012-10-21 MED ORDER — GUAIFENESIN-DM 100-10 MG/5ML PO SYRP: 15.0000 mL | ORAL_SOLUTION | ORAL | Status: DC | PRN

## 2012-10-21 MED ORDER — MOVING RIGHT ALONG BOOK
Freq: Once | Status: AC
  Administered 2012-10-21: 15:00:00
  Filled 2012-10-21: qty 1

## 2012-10-21 NOTE — Progress Notes (Signed)
2 Days Post-Op Procedure(s) (LRB): CORONARY ARTERY BYPASS GRAFTING (CABG) (N/A) Subjective: Feels well overall, does get tired when up  Objective: Vital signs in last 24 hours: Temp:  [97.5 F (36.4 [name])-98.4 F (36.9 [name])] 98 F (36.7 [name]) (06/22 0728) Pulse Rate:  [90-93] 90 (06/22 0900) Cardiac Rhythm:  [-] Atrial paced (06/22 0900) Resp:  [15-22] 20 (06/22 0900) BP: (82-141)/(32-84) 141/42 mmHg (06/22 0900) SpO2:  [89 %-98 %] 96 % (06/22 0900) Arterial Line BP: (104-149)/(37-48) 104/37 mmHg (06/21 1300) Weight:  [170 lb 3.1 oz (77.2 kg)] 170 lb 3.1 oz (77.2 kg) (06/22 0500)  Hemodynamic parameters for last 24 hours: PAP: (26-34)/(10-15) 26/10 mmHg  Intake/Output from previous day: 06/21 0701 - 06/22 0700 In: 1092.7 [P.O.:360; I.V.:482.7; IV Piggyback:250] Out: 1154 [Urine:1004; Emesis/NG output:120; Chest Tube:30] Intake/Output this shift: Total I/O In: 90 [I.V.:40; IV Piggyback:50] Out: 25 [Urine:25]  General appearance: alert and no distress Neurologic: intact Heart: regular rate and rhythm Lungs: diminished breath sounds bibasilar Abdomen: normal findings: soft, non-tender  Lab Results:  Recent Labs  10/20/12 1630 10/20/12 1638 10/21/12 0400  WBC 5.0  --  5.2  HGB 8.0* 7.1* 7.8*  HCT 23.6* 21.0* 23.2*  PLT 79*  --  86*   BMET:  Recent Labs  10/20/12 0445  10/20/12 1638 10/21/12 0400  NA 138  --  140 134*  K 3.8  --  4.3 3.9  CL 109  --  108 106  CO2 22  --   --  24  GLUCOSE 139*  --  116* 92  BUN 11  --  12 14  CREATININE 0.55  < > 0.80 0.68  CALCIUM 7.4*  --   --  7.6*  < > = values in this interval not displayed.  PT/INR:  Recent Labs  10/19/12 1500  LABPROT 17.6*  INR 1.49   ABG    Component Value Date/Time   PHART 7.322* 10/19/2012 2215   HCO3 19.5* 10/19/2012 2215   TCO2 22 10/20/2012 1638   ACIDBASEDEF 6.0* 10/19/2012 2215   O2SAT 99.0 10/19/2012 2215   CBG (last 3)   Recent Labs  10/20/12 2339 10/21/12 0354 10/21/12 0732   GLUCAP 77 92 142*    Assessment/Plan: S/P Procedure(s) (LRB): CORONARY ARTERY BYPASS GRAFTING (CABG) (N/A) POD # 2 CABG CV- stable- ASA, beta blocker, statin  RESP- pulmonary hygiene  RENAL- volume overloaded- continue diuresis  Thrombocytopenia- improved but still < 100K- no heparin  Anemia- on borderline but will try to avoid additional transfusion  Transfer to 2000   LOS: 3 days    [name] 10/21/2012

## 2012-10-21 NOTE — Progress Notes (Signed)
Patient ambulated ~ 136ft in hallway utilizing oxygen at 2L via nasal cannula. Mildly labored respirations noted during ambulation. Patient without any complaints and was able to converse while ambulation. Oxygen saturations 96-97%. Upon return to room, patient assisted to chair.

## 2012-10-22 ENCOUNTER — Inpatient Hospital Stay (HOSPITAL_COMMUNITY): Payer: Medicare Other

## 2012-10-22 LAB — TYPE AND SCREEN
Unit division: 0
Unit division: 0
Unit division: 0

## 2012-10-22 LAB — CBC
MCH: 31 pg (ref 26.0–34.0)
MCV: 92.6 fL (ref 78.0–100.0)
Platelets: 114 10*3/uL — ABNORMAL LOW (ref 150–400)
RDW: 15.3 % (ref 11.5–15.5)
WBC: 5.7 10*3/uL (ref 4.0–10.5)

## 2012-10-22 LAB — BASIC METABOLIC PANEL
Calcium: 8.2 mg/dL — ABNORMAL LOW (ref 8.4–10.5)
Chloride: 104 mEq/L (ref 96–112)
Creatinine, Ser: 0.44 mg/dL — ABNORMAL LOW (ref 0.50–1.10)
GFR calc Af Amer: >90 mL/min (ref >90)

## 2012-10-22 LAB — GLUCOSE, CAPILLARY
Glucose-Capillary: 158 mg/dL — ABNORMAL HIGH (ref 70–99)
Glucose-Capillary: 135 mg/dL — ABNORMAL HIGH (ref 70–99)

## 2012-10-22 MED ORDER — LOSARTAN POTASSIUM 25 MG PO TABS
25.0000 mg | ORAL_TABLET | Freq: Every day | ORAL | Status: DC
  Administered 2012-10-22: 25 mg via ORAL
  Filled 2012-10-22 (×2): qty 1

## 2012-10-22 MED ORDER — FUROSEMIDE 10 MG/ML IJ SOLN
40.0000 mg | Freq: Once | INTRAMUSCULAR | Status: AC
  Administered 2012-10-22: 40 mg via INTRAVENOUS
  Filled 2012-10-22: qty 4

## 2012-10-22 MED ORDER — POTASSIUM CHLORIDE CRYS ER 20 MEQ PO TBCR
20.0000 meq | EXTENDED_RELEASE_TABLET | Freq: Every day | ORAL | Status: DC
  Administered 2012-10-23: 20 meq via ORAL
  Filled 2012-10-22: qty 1

## 2012-10-22 MED ORDER — TRAMADOL HCL 50 MG PO TABS
50.0000 mg | ORAL_TABLET | ORAL | Status: DC | PRN
  Administered 2012-10-23: 50 mg via ORAL
  Filled 2012-10-22: qty 1

## 2012-10-22 NOTE — Anesthesia Postprocedure Evaluation (Addendum)
  Anesthesia Post-op Note  Patient: [name]  Procedure(s) Performed: Procedure(s): CORONARY ARTERY BYPASS GRAFTING (CABG) (N/A)  Patient Location: ICU  Anesthesia Type:General  Level of Consciousness: Patient remains intubated per anesthesia plan  Airway and Oxygen Therapy: Patient remains intubated per anesthesia plan  Post-op Pain: none  Post-op Assessment: Post-op Vital signs reviewed, Patient's Cardiovascular Status Stable and Respiratory Function Stable  Post-op Vital Signs: Reviewed and stable  Complications: No apparent anesthesia complications

## 2012-10-22 NOTE — Progress Notes (Addendum)
      [address]       [place] [address]             [phone]        3 Days Post-Op Procedure(s) (LRB): CORONARY ARTERY BYPASS GRAFTING (CABG) (N/A)  Subjective: Patient getting washed up this am. Had nausea followed by emesis after given "2 pain pills". Passing flatus.  Objective: Vital signs in last 24 hours: Temp:  [98.6 F (37 C)-98.8 F (37.1 C)] 98.7 F (37.1 C) (06/23 0538) Pulse Rate:  [79-90] 79 (06/23 0538) Cardiac Rhythm:  [-] Normal sinus rhythm (06/22 2000) Resp:  [17-28] 18 (06/23 0538) BP: (120-147)/(42-74) 126/70 mmHg (06/23 0538) SpO2:  [90 %-98 %] 96 % (06/23 0538) Weight:  [77.338 kg (170 lb 8 oz)] 77.338 kg (170 lb 8 oz) (06/23 0538)  Pre op weight  69 kg Current Weight  10/22/12 77.338 kg (170 lb 8 oz)      Intake/Output from previous day: 06/22 0701 - 06/23 0700 In: 350 [P.O.:120; I.V.:80; IV Piggyback:150] Out: 580 [Urine:580]   Physical Exam:  Cardiovascular: RRR, no murmurs, gallops, or rubs. Pulmonary: Diminished at bases; no rales, wheezes, or rhonchi. Abdomen: Soft, non tender, bowel sounds present. Extremities: Bilateral lower extremity edema. Ecchymosis thighs L>R Wounds: Clean and dry.  No erythema or signs of infection.  Lab Results: CBC: Recent Labs  10/21/12 0400 10/22/12 0435  WBC 5.2 5.7  HGB 7.8* 8.8*  HCT 23.2* 26.3*  PLT 86* 114*   BMET:  Recent Labs  10/21/12 0400 10/22/12 0435  NA 134* 134*  K 3.9 4.8  CL 106 104  CO2 24 24  GLUCOSE 92 112*  BUN 14 14  CREATININE 0.68 0.44*  CALCIUM 7.6* 8.2*    PT/INR:  Lab Results  Component Value Date   INR 1.49 10/19/2012   INR 0.98 10/19/2012   ABG:  INR: Will add last result for INR, ABG once components are confirmed Will add last 4 CBG results once components are confirmed  Assessment/Plan:  1. CV - SR. On Lopressor 12.5 bid. Will restart low dose Cozaar. 2.  Pulmonary - CXR this am appears to show no pneumothorax, bibasilar  atelectasis, and small bilateral pleural effusions. On 2 liters of oxygen via [place]-wean as tolerates. Encourage incentive spirometer 3. Volume Overload - On lasix 40 po daily. Will give IV today 4.  Acute blood loss anemia - H and H up to 8.8 and 26.3. On Niferex. 5.Mild thrombocytopenia-platelets up to 114,000 6.DM-CBGs 138/113/113. On Levemir 20 daily 7.Remove EPW in am 8.Possibly discharge 1-2 days  ZIMMERMAN,DONIELLE MPA-C 10/22/2012,7:50 AM  Patient seen and examined. Agree with above. Possibly home in AM if she continues to progress

## 2012-10-22 NOTE — Discharge Summary (Signed)
Physician Discharge Summary       [address]       [place] [address]             [phone]    Patient ID: [name] MRN: [id number] DOB/AGE: 69-04-1946 69 y.o.  Admit date: 10/18/2012 Discharge date: 10/23/2012  Admission Diagnoses: 1. S/p NSTEMI 2.Multivessel CAD 3.History of DM 4.History of hypertension 5.History of hyperlipidemia 6.Historyof chronic diastolic heart failure 7.History of tobacco abuse 8.History of GERD 9.History of esophageal stricture  Discharge Diagnoses:  1. S/p NSTEMI 2.Multivessel CAD 3.History of DM 4.History of hypertension 5.History of hyperlipidemia 6.Historyof chronic diastolic heart failure 7.History of tobacco abuse 8.History of GERD 9.History of esophageal stricture 10.ABL anemia 11.Thrombocytopenia   Procedure (s):  Median sternotomy, extracorporeal circulation, coronary  artery bypass grafting x4 (left internal mammary artery to left anterior  descending, saphenous vein graft to first diagonal, saphenous vein graft  to obtuse marginal 1, saphenous vein graft to posterior descending),  endoscopic vein harvest both leg by Dr. Dorris Fetch on 10/19/2012.   History of Presenting Illness: 67 yo WF with no prior cardiac history. She was hospitalized in May with a respiratory illness- "double pneumonia". During that admission she had a positive troponin of 12. Cath deferred due to an inability to tolerate the procedure. Today she presented for a cardiac catheterization and was found to have severe 3 vessel CAD.  She initially denied CP. But on further questioning she has been having some CP, which she attributed to reflux/ indigestion. She also gets SOB with minimal exertion.    Brief Hospital Course:  The patient was extubated the evening of surgery without difficulty. She remained afebrile and hemodynamically stable. Theone Murdoch, a line, chest tubes, and foley were removed early in the post operative course.  Lopressor was started and titrated accordingly. She was volume over loaded and diuresed. She was weaned off the insulin drip. Once she was tolerating a diet, Insulin was restarted. She will resume her Insulin pump at discharge.She did have ABL anemia. Her last H and H was up to 8.8 and 26.3. She was started on oral iron. She also had thrombocytopenia. Her last platelet count was up to 114,000.The patient's HGA1C pre op was  8.2. The patient was felt surgically stable for transfer from the ICU to 2000 for further convalescence on 10/21/2012. She continues to progress with cardiac rehab. She was ambulating on room air. She has been tolerating a diet and has had a bowel movement. Epicardial pacing wires and chest tube sutures will be removed prior to discharge. Provided the patient remains afebrile, hemodynamically stable, and pending morning round evaluation, she will be surgically stable for discharge on 10/23/2012.    Latest Vital Signs: Blood pressure 119/69, pulse 86, temperature 98.4 F (36.9 C), temperature source Oral, resp. rate 18, height 5\' 4"  (1.626 m), weight 75.116 kg (165 lb 9.6 oz), SpO2 93.00%.  Physical Exam: Cardiovascular: RRR, no murmurs, gallops, or rubs.  Pulmonary: Diminished at bases; no rales, wheezes, or rhonchi.  Abdomen: Soft, non tender, bowel sounds present.  Extremities: Bilateral lower extremity edema. Ecchymosis thighs L>R  Wounds: Clean and dry. No erythema or signs of infection.   Discharge Condition:Stable  Recent laboratory studies:  Lab Results  Component Value Date   WBC 5.7 10/22/2012   HGB 8.8* 10/22/2012   HCT 26.3* 10/22/2012   MCV 92.6 10/22/2012   PLT 114* 10/22/2012   Lab Results  Component Value Date   NA 134* 10/22/2012  K 4.8 10/22/2012   CL 104 10/22/2012   CO2 24 10/22/2012   CREATININE 0.44* 10/22/2012   GLUCOSE 112* 10/22/2012      Diagnostic Studies: Dg Chest 2 View  10/22/2012   *RADIOLOGY REPORT*  Clinical Data: Coronary artery  disease status post CABG  CHEST - 2 VIEW  Comparison: Prior chest x-ray 10/27/2012  Findings: The right IJ vascular sheath has been removed.  There are small bilateral pleural effusions with associated bibasilar atelectasis.  Otherwise, the lungs are clear.  No pulmonary edema. Stable cardiomegaly.  Median sternotomy with multivessel CABG including LIMA bypass.  Epicardial pacing leads remain in place. No acute osseous abnormality.  Stable T12 compression fracture.  IMPRESSION: Small bilateral pleural effusions with associated bibasilar atelectasis.   Original Report Authenticated By: Malachy Moan, M.D.     Future Appointments Provider Department Dept Phone   11/05/2012 2:15 PM Antonieta Iba, MD [facility] at [place] [phone]   11/08/2012 3:45 PM Wynona Dove, MD [facility] [phone]   11/20/2012 11:45 AM Loreli Slot, MD [facility] [phone]      Discharge Medications:   Medication List    STOP taking these medications       amLODipine 10 MG tablet  Commonly known as:  NORVASC     aspirin 81 MG tablet     ibuprofen 800 MG tablet  Commonly known as:  ADVIL,MOTRIN     MUCINEX DM PO     NITROSTAT 0.4 MG SL tablet  Generic drug:  nitroGLYCERIN      TAKE these medications       aspirin 325 MG EC tablet  Take 1 tablet (325 mg total) by mouth daily.     atorvastatin 20 MG tablet  Commonly known as:  LIPITOR  Take 1 tablet (20 mg total) by mouth daily.     etanercept 25 MG injection  Commonly known as:  ENBREL  Inject 25 mg into the skin once a week.     folic acid 1 MG tablet  Commonly known as:  FOLVITE  Take 1 tablet (1 mg total) by mouth daily.     furosemide 40 MG tablet  Commonly known as:  LASIX  Take 1 tablet (40 mg total) by mouth daily.     insulin lispro 100 UNIT/ML injection  Commonly known as:  HUMALOG  As directed via Insulin pump     iron  polysaccharides 150 MG capsule  Commonly known as:  NIFEREX  Take 150 mg by mouth 2 (two) times daily.     Krill Oil 300 MG Caps  Take by mouth daily.     losartan 50 MG tablet  Commonly known as:  COZAAR  Take 1 tablet (50 mg total) by mouth daily.     methotrexate 2.5 MG tablet  Commonly known as:  RHEUMATREX  Take 4 tablets (10 mg total) by mouth once a week. Takes every Wednesday.Caution:Chemotherapy. Protect from light.     metoprolol tartrate 25 MG tablet  Commonly known as:  LOPRESSOR  Take 0.5 tablets (12.5 mg total) by mouth 2 (two) times daily.     multivitamin tablet  Take 1 tablet by mouth daily.     pantoprazole 40 MG tablet  Commonly known as:  PROTONIX  Take one tablet by mouth twice daily.     potassium chloride SA 20 MEQ tablet  Commonly known as:  K-DUR,KLOR-CON  Take 1 tablet (20 mEq total) by mouth  daily.     pyridOXINE 100 MG tablet  Commonly known as:  VITAMIN B-6  Take 100 mg by mouth daily.     RECLAST 5 MG/100ML Soln  Generic drug:  zoledronic acid  Inject 5 mg into the vein once.     ROLAIDS PO  Take by mouth as needed.     traMADol 50 MG tablet  Commonly known as:  ULTRAM  Take 1 tablet (50 mg total) by mouth every 4 (four) hours as needed for pain.     triamcinolone 0.1 % cream : eucerin Crea  Apply 1 application topically 2 (two) times daily as needed.     TUMS PO  Take 4 tablets by mouth daily.     Vitamin D 2000 UNITS Caps  Take 1 capsule by mouth daily.       The patient has been discharged on:   1.Beta Blocker:  Yes [ x  ]                              No   [   ]                              If No, reason:  2.Ace Inhibitor/ARB: Yes [  x ]                                     No  [    ]                                     If No, reason:  3.Statin:   Yes [ x  ]                  No  [   ]                  If No, reason:  4.Ecasa:  Yes  [ x ]                  No   [   ]                  If No, reason:  Follow Up  Appointments: Follow-up Information   Follow up with Julien Nordmann, MD. (Call for a follow up appointment for 2 weeks)    Contact information:   [address] [place] [address] [phone]       Follow up with Loreli Slot, MD. (PA/LAT CXR to be taken (at [facility] which is in the same building as Dr. Sunday Corn office) on 11/20/2012 at 10:45 am;Appointment with Dr. Dorris Fetch is on 11/20/2012 at 11:45 am)    Contact information:   [address] [place] [address] [phone]       Follow up with Wynona Dove, MD. (Call for a follow up appointment regarding HGA1C 8.5)    Contact information:   [address] [place] [address] [phone]       Signed: Doree Fudge MPA-C 10/23/2012, 2:33 PM

## 2012-10-22 NOTE — Care Management Note (Unsigned)
    Page 1 of 1   10/22/2012     1:49:00 PM   CARE MANAGEMENT NOTE 10/22/2012  Patient:  [name],[name]   Account Number:  [id number]  Date Initiated:  10/22/2012  Documentation initiated by:  Paytan Recine  Subjective/Objective Assessment:   PT S/P CABG X 4 ON 10/19/12.  PTA, PT INDEPENDENT, LIVES WITH SPOUSE.     Action/Plan:   HUSBAND TO PROVIDE CARE AT DISCHARGE; PT HAS RW AT HOME. WILL FOLLOW FOR ADDITIONAL NEEDS.   Anticipated DC Date:  10/24/2012   Anticipated DC Plan:  HOME W HOME HEALTH SERVICES      DC Planning Services  CM consult      Choice offered to / List presented to:             Status of service:  In process, will continue to follow Medicare Important Message given?   (If response is "NO", the following Medicare IM given date fields will be blank) Date Medicare IM given:   Date Additional Medicare IM given:    Discharge Disposition:    Per UR Regulation:  Reviewed for med. necessity/level of care/duration of stay  If discussed at Long Length of Stay Meetings, dates discussed:    Comments:

## 2012-10-22 NOTE — Progress Notes (Signed)
CARDIAC REHAB PHASE I   PRE:  Rate/Rhythm: 81SR  BP:  Supine: 166/66  Sitting:   Standing:    SaO2: 93%1L,    93%RA  MODE:  Ambulation: 350 ft   POST:  Rate/Rhythm: 99SR  BP:  Supine:   Sitting: 168/60  Standing:    SaO2: 91-92%RA hall, 94-95% room [phone] Pt walked 350 ft on RA with rolling walker and asst x 1 with steady gait. Stopped once to rest. Sats 91-92%RA during walk. To recliner after walk. Left off oxygen. Notified pt's RN. Encouraged IS. Husband in room.    Luetta Nutting, RN BSN  10/22/2012 9:17 AM

## 2012-10-22 NOTE — Progress Notes (Signed)
Pt ambulated for second time today approx. 350 feet. Pt tolerated well, no SOB and no O2 required. Pt back in chair with call bell in reach.

## 2012-10-23 ENCOUNTER — Encounter (HOSPITAL_COMMUNITY): Payer: Self-pay | Admitting: Thoracic Surgery (Cardiothoracic Vascular Surgery)

## 2012-10-23 LAB — GLUCOSE, CAPILLARY: Glucose-Capillary: 106 mg/dL — ABNORMAL HIGH (ref 70–99)

## 2012-10-23 MED ORDER — ASPIRIN 325 MG PO TBEC: 325.0000 mg | DELAYED_RELEASE_TABLET | Freq: Every day | ORAL | Status: DC

## 2012-10-23 MED ORDER — LOSARTAN POTASSIUM 50 MG PO TABS
50.0000 mg | ORAL_TABLET | Freq: Every day | ORAL | Status: DC
  Administered 2012-10-23: 50 mg via ORAL
  Filled 2012-10-23: qty 1

## 2012-10-23 MED ORDER — METHOTREXATE 2.5 MG PO TABS: 10.0000 mg | ORAL_TABLET | ORAL | Status: DC

## 2012-10-23 MED ORDER — FUROSEMIDE 40 MG PO TABS: 40.0000 mg | ORAL_TABLET | Freq: Every day | ORAL | Status: DC

## 2012-10-23 MED ORDER — METHOTREXATE 2.5 MG PO TABS: 2.5000 mg | ORAL_TABLET | ORAL | Status: DC

## 2012-10-23 MED ORDER — FUROSEMIDE 10 MG/ML IJ SOLN
40.0000 mg | Freq: Once | INTRAMUSCULAR | Status: AC
  Administered 2012-10-23: 40 mg via INTRAVENOUS
  Filled 2012-10-23: qty 4

## 2012-10-23 MED ORDER — LOSARTAN POTASSIUM 100 MG PO TABS: 100.0000 mg | ORAL_TABLET | Freq: Every day | ORAL | Status: DC

## 2012-10-23 MED ORDER — LOSARTAN POTASSIUM 50 MG PO TABS: 50.0000 mg | ORAL_TABLET | Freq: Every day | ORAL | Status: DC

## 2012-10-23 MED ORDER — METOPROLOL TARTRATE 25 MG PO TABS: 12.5000 mg | ORAL_TABLET | Freq: Two times a day (BID) | ORAL | Status: DC

## 2012-10-23 MED ORDER — TRAMADOL HCL 50 MG PO TABS: 50.0000 mg | ORAL_TABLET | ORAL | Status: DC | PRN

## 2012-10-23 MED ORDER — POTASSIUM CHLORIDE CRYS ER 20 MEQ PO TBCR: 20.0000 meq | EXTENDED_RELEASE_TABLET | Freq: Every day | ORAL | Status: DC

## 2012-10-23 MED FILL — Sodium Chloride Irrigation Soln 0.9%: Qty: 3000 | Status: AC

## 2012-10-23 MED FILL — Heparin Sodium (Porcine) Inj 1000 Unit/ML: INTRAMUSCULAR | Qty: 10 | Status: AC

## 2012-10-23 MED FILL — Sodium Bicarbonate IV Soln 8.4%: INTRAVENOUS | Qty: 50 | Status: AC

## 2012-10-23 MED FILL — Electrolyte-R (PH 7.4) Solution: INTRAVENOUS | Qty: 4000 | Status: AC

## 2012-10-23 MED FILL — Lidocaine HCl IV Inj 20 MG/ML: INTRAVENOUS | Qty: 5 | Status: AC

## 2012-10-23 MED FILL — Albumin, Human Inj 5%: INTRAVENOUS | Qty: 250 | Status: AC

## 2012-10-23 MED FILL — Mannitol IV Soln 20%: INTRAVENOUS | Qty: 500 | Status: AC

## 2012-10-23 NOTE — Progress Notes (Addendum)
      [address]       [place] [address]             [phone]        4 Days Post-Op Procedure(s) (LRB): CORONARY ARTERY BYPASS GRAFTING (CABG) (N/A)  Subjective: Patient feeling ok this am.  Objective: Vital signs in last 24 hours: Temp:  [98 F (36.7 C)-98.7 F (37.1 C)] 98 F (36.7 C) (06/24 0634) Pulse Rate:  [80-92] 86 (06/24 0634) Cardiac Rhythm:  [-] Normal sinus rhythm (06/24 0748) Resp:  [18] 18 (06/24 0634) BP: (129-162)/(42-77) 153/64 mmHg (06/24 0634) SpO2:  [92 %-95 %] 95 % (06/24 0634) Weight:  [75.116 kg (165 lb 9.6 oz)] 75.116 kg (165 lb 9.6 oz) (06/24 0634)  Pre op weight  69 kg Current Weight  10/23/12 75.116 kg (165 lb 9.6 oz)      Intake/Output from previous day: 06/23 0701 - 06/24 0700 In: 480 [P.O.:480] Out: 200 [Urine:200]   Physical Exam:  Cardiovascular: RRR, no murmurs, gallops, or rubs. Pulmonary: Diminished at bases; no rales, wheezes, or rhonchi. Abdomen: Soft, non tender, bowel sounds present. Extremities: Bilateral lower extremity edema. Ecchymosis thighs L>R Wounds: Clean and dry.  No erythema or signs of infection.  Lab Results: CBC:  Recent Labs  10/21/12 0400 10/22/12 0435  WBC 5.2 5.7  HGB 7.8* 8.8*  HCT 23.2* 26.3*  PLT 86* 114*   BMET:   Recent Labs  10/21/12 0400 10/22/12 0435  NA 134* 134*  K 3.9 4.8  CL 106 104  CO2 24 24  GLUCOSE 92 112*  BUN 14 14  CREATININE 0.68 0.44*  CALCIUM 7.6* 8.2*    PT/INR:  Lab Results  Component Value Date   INR 1.49 10/19/2012   INR 0.98 10/19/2012   ABG:  INR: Will add last result for INR, ABG once components are confirmed Will add last 4 CBG results once components are confirmed  Assessment/Plan:  1. CV - SR. On Lopressor 12.5 bid and Cozaar 25 daily. Will increase Cozaar to 50 daily for better bp control. 2.  Pulmonary - Wean off of oxygen yesterday. Encourage incentive spirometer 3. Volume Overload - Will give Lasix IV this am  again 4.  Acute blood loss anemia - H and H up to 8.8 and 26.3. On Niferex. 5.Mild thrombocytopenia-platelets up to 114,000 6.DM-CBGs 158/135/106. On Levemir 20 daily 7.Remove EPW  8.Possibly discharge later today vs am  ZIMMERMAN,DONIELLE MPA-C 10/23/2012,7:56 AM  Patient seen and examined. Agree with above.  She wants to go home today, so we'll discharger her this afternoon

## 2012-10-23 NOTE — Progress Notes (Signed)
Discontinued pt's chest tube sutures and placed steri strips with benzoin on site. Pt tolerated well. Gave pt discharge education and new prescriptions. Pt verbalized understanding. Pt is stable for discharge with husband.

## 2012-10-23 NOTE — Progress Notes (Signed)
Inpatient Diabetes Program Recommendations  AACE/ADA: New Consensus Statement on Inpatient Glycemic Control (2013)  Target Ranges:  Prepandial:   less than 140 mg/dL      Peak postprandial:   less than 180 mg/dL (1-2 hours)      Critically ill patients:  140 - 180 mg/dL   Diabetes Coordinator spoke with patient concerning restarting her insulin pump.  Patient has already received Levemir today and will need to wait until tomorrow morning to restart her pump.  Patient plans to administer herself bolus insulin per vial and syringe tonight at supper in place of the pump.  Patient understands she needs to wait until tomorrow to restart pump bc basal insulin Levemir has been given.  No further questions/concerns at the conclusion of this meeting. Thank you  Piedad Climes BSN, RN,CDE Inpatient Diabetes Coordinator [phone] (team pager)

## 2012-10-23 NOTE — Progress Notes (Signed)
Pt ambulated approx. 500 feet with standby assistance. Pt tolerated well, still somewhat SOB with exertion. Pt back in chair with call bell in reach.

## 2012-10-23 NOTE — Progress Notes (Signed)
Removed pt's EPW per order. INR WNL and no arrhythmias overnight. Tips of EPW were intact. Pt resting in bed for one hour with frequent vitals set up.

## 2012-10-23 NOTE — Progress Notes (Signed)
1320-1400 Cardiac Rehab  Completed discharge with patient and husband. They voiced understanding. She is interested in doing the outpatient cardiac rehab in [place] and we will send letter of interest to them.   Lindaann Slough, MS

## 2012-10-24 MED FILL — Potassium Chloride Inj 2 mEq/ML: INTRAVENOUS | Qty: 40 | Status: AC

## 2012-10-24 MED FILL — Heparin Sodium (Porcine) Inj 1000 Unit/ML: INTRAMUSCULAR | Qty: 30 | Status: AC

## 2012-10-24 MED FILL — Magnesium Sulfate Inj 50%: INTRAMUSCULAR | Qty: 10 | Status: AC

## 2012-10-24 MED FILL — Sodium Chloride IV Soln 0.9%: INTRAVENOUS | Qty: 1000 | Status: AC

## 2012-10-27 DIAGNOSIS — I2510 Atherosclerotic heart disease of native coronary artery without angina pectoris: Secondary | ICD-10-CM

## 2012-10-27 DIAGNOSIS — J40 Bronchitis, not specified as acute or chronic: Secondary | ICD-10-CM

## 2012-10-27 DIAGNOSIS — I10 Essential (primary) hypertension: Secondary | ICD-10-CM

## 2012-10-27 DIAGNOSIS — E785 Hyperlipidemia, unspecified: Secondary | ICD-10-CM

## 2012-10-27 DIAGNOSIS — E109 Type 1 diabetes mellitus without complications: Secondary | ICD-10-CM

## 2012-11-05 ENCOUNTER — Encounter: Payer: Self-pay | Admitting: Cardiovascular Disease

## 2012-11-05 ENCOUNTER — Ambulatory Visit (INDEPENDENT_AMBULATORY_CARE_PROVIDER_SITE_OTHER): Payer: Medicare Other | Admitting: Cardiovascular Disease

## 2012-11-05 VITALS — BP 140/54 | HR 79 | Ht 64.0 in | Wt 154.5 lb

## 2012-11-05 DIAGNOSIS — I10 Essential (primary) hypertension: Secondary | ICD-10-CM

## 2012-11-05 DIAGNOSIS — E785 Hyperlipidemia, unspecified: Secondary | ICD-10-CM

## 2012-11-05 DIAGNOSIS — R079 Chest pain, unspecified: Secondary | ICD-10-CM

## 2012-11-05 DIAGNOSIS — I25810 Atherosclerosis of coronary artery bypass graft(s) without angina pectoris: Secondary | ICD-10-CM

## 2012-11-05 DIAGNOSIS — D509 Iron deficiency anemia, unspecified: Secondary | ICD-10-CM

## 2012-11-05 DIAGNOSIS — R0602 Shortness of breath: Secondary | ICD-10-CM

## 2012-11-05 DIAGNOSIS — E109 Type 1 diabetes mellitus without complications: Secondary | ICD-10-CM

## 2012-11-05 NOTE — Assessment & Plan Note (Addendum)
Stressed to her the importance of aggressive cholesterol control. We'll repeat her lab work in 3 months time. goal LDL less than 70.

## 2012-11-05 NOTE — Assessment & Plan Note (Signed)
Stressed to her the importance of aggressive diabetes control. This will be important for long-term patency of her bypass grafts.

## 2012-11-05 NOTE — Assessment & Plan Note (Signed)
Encouraged she eat foods rich in iron given recent ecchymosis and blood loss.

## 2012-11-05 NOTE — Assessment & Plan Note (Signed)
Blood pressure is well controlled on today's visit. No changes made to the medications. 

## 2012-11-05 NOTE — Patient Instructions (Addendum)
You are doing well. No medication changes were made.  Please call us if you have new issues that need to be addressed before your next appt.  Your physician wants you to follow-up in: 3 months You will receive a reminder letter in the mail two months in advance. If you don't receive a letter, please call our office to schedule the follow-up appointment.   

## 2012-11-05 NOTE — Assessment & Plan Note (Signed)
Successful four-vessel bypass surgery. Doing well in recovery. No medication changes made at this time. Continue on her diuretic, possibly until she sees Dr. Dorris Fetch in followup.

## 2012-11-05 NOTE — Progress Notes (Signed)
Patient ID: [name], female    DOB: Jul 30, 1945, 67 y.o.   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 She had spiking fevers, chills, severe shortness of breath. Sugars were in the 400s. She had a long course of antibiotics to her hospital visit. Secondary to significant cough, she was unable to lie flat for cardiac catheterization. She declined cardiac catheterization on the same hospital visit. On admission to the hospital, her immunosuppressants, Enbrel and methotrexate were held  She had cardiac catheterization in followup after her pneumonia resolved. This showed severe three-vessel CAD. She was transferred from [facility] to [place] for bypass surgery. She had four-vessel bypass surgery with LIMA to the LAD, saphenous vein graft to the diagonal, vein graft to the OM, vein graft to the PDA. She reports relatively uneventful surgery. She was admitted on June 19, discharge June 24.   In followup, she has some swelling and tenderness of her left leg with ecchymosis. This is slowly getting better. She is on diuretics daily with only a mild shortness of breath. She is walking for up to 8-10 minutes at a time with her husband. She states that her sugars have been up and down, made worse by the recent surgery  Previous  Echocardiogram 8/24 2014 showed normal ejection fraction with no wall motion abnormality, normal right ventricular size and function, moderately elevated right ventricular systolic pressures.  Cardiac catheterization showed 100% proximally occluded RCA, critical ostial LAD disease, severe mid LAD disease, severe mid left circumflex disease, ejection fraction estimated at 50-55% with mild hypokinesis of  the inferior wall.  EKG shows normal sinus rhythm with poor R-wave progression to the anterior precordial leads, nonspecific ST abnormalities in V5 and V6, rate 79 beats per minute   Outpatient Encounter Prescriptions as of 11/05/2012  Medication Sig Dispense Refill  . aspirin EC 325 MG EC tablet Take 1 tablet (325 mg total) by mouth daily.  30 tablet  0  . atorvastatin (LIPITOR) 20 MG tablet Take 1 tablet (20 mg total) by mouth daily.  90 tablet  2  . Calcium Carbonate Antacid (TUMS PO) Take 4 tablets by mouth daily.        . Cholecalciferol (VITAMIN D) 2000 UNITS CAPS Take 1 capsule by mouth daily.      . Dihydroxyaluminum Sod Carb (ROLAIDS PO) Take by mouth as needed.        . etanercept (ENBREL) 25 MG injection Inject 25 mg into the skin once a week.      . folic acid (FOLVITE) 1 MG tablet Take 1 tablet (1 mg total) by mouth daily.  90 tablet  3  . furosemide (LASIX) 40 MG tablet Take 1 tablet (40 mg total) by mouth daily.  30 tablet  0  . insulin lispro (HUMALOG) 100 UNIT/ML injection As directed via Insulin pump  30 mL  11  . iron polysaccharides (NIFEREX) 150 MG capsule Take 150 mg by mouth 2 (two) times daily.      [name] Oil 300 MG CAPS Take by mouth daily.      Marland Kitchen losartan (COZAAR) 50 MG tablet Take 1 tablet (50 mg total) by mouth daily.  30 tablet  1  . methotrexate (RHEUMATREX) 2.5 MG tablet Take  4 tablets (10 mg total) by mouth once a week. Takes every Wednesday.Caution:Chemotherapy. Protect from light.  4 tablet  0  . metoprolol (LOPRESSOR) 25 MG tablet Take 0.5 tablets (12.5 mg total) by mouth 2 (two) times daily.  30 tablet  1  . Multiple Vitamin (MULTIVITAMIN) tablet Take 1 tablet by mouth daily.        . pantoprazole (PROTONIX) 40 MG tablet Take one tablet by mouth twice daily.  60 tablet  6  . potassium chloride SA (K-DUR,KLOR-CON) 20 MEQ tablet Take 1 tablet (20 mEq total) by mouth daily.  30 tablet  1  . pyridOXINE (VITAMIN B-6) 100 MG tablet Take 100 mg by mouth daily.         . traMADol (ULTRAM) 50 MG tablet Take 1 tablet (50 mg total) by mouth every 4 (four) hours as needed for pain.  30 tablet  0  . Triamcinolone Acetonide (TRIAMCINOLONE 0.1 % CREAM : EUCERIN) CREA Apply 1 application topically 2 (two) times daily as needed.  1 each  3  . zoledronic acid (RECLAST) 5 MG/100ML SOLN Inject 5 mg into the vein once.      . [DISCONTINUED] amLODipine (NORVASC) 10 MG tablet Take 10 mg by mouth daily.        No facility-administered encounter medications on file as of 11/05/2012.    Review of Systems  Constitutional: Negative.   HENT: Negative.   Eyes: Negative.   Cardiovascular: Positive for chest pain.  Gastrointestinal: Negative.   Musculoskeletal: Negative.        Discomfort of the left leg from bruising and swelling  Skin: Negative.   Neurological: Negative.   Psychiatric/Behavioral: Negative.   All other systems reviewed and are negative.    BP 140/54  Pulse 79  Ht 5\' 4"  (1.626 m)  Wt 154 lb 8 oz (70.081 kg)  BMI 26.51 kg/m2  Physical Exam  Nursing note and vitals reviewed. Constitutional: She is oriented to person, place, and time. She appears well-developed and well-nourished.  HENT:  Head: Normocephalic.  Nose: Nose normal.  Mouth/Throat: Oropharynx is clear and moist.  Eyes: Conjunctivae are normal. Pupils are equal, round, and reactive to light.  Neck: Normal range of motion. Neck supple. No JVD present.  Cardiovascular: Normal rate, regular rhythm, S1 normal, S2 normal, normal heart sounds and intact distal pulses.  Exam reveals no gallop and no friction rub.   No murmur heard. Well healing mediastinal incision  Pulmonary/Chest: Effort normal and breath sounds normal. No respiratory distress. She has no wheezes. She has no rales. She exhibits no tenderness.  Scant Rales at the bases bilaterally  Abdominal: Soft. Bowel sounds are normal. She exhibits no distension. There is no tenderness.  Musculoskeletal: Normal range of motion. She  exhibits no edema and no tenderness.  Ecchymosis of the left upper thigh, medial aspect. Trace to mild edema of the left lower extremity.  Lymphadenopathy:    She has no cervical adenopathy.  Neurological: She is alert and oriented to person, place, and time. Coordination normal.  Skin: Skin is warm and dry. No rash noted. No erythema.  Psychiatric: She has a normal mood and affect. Her behavior is normal. Judgment and thought content normal.    Assessment and Plan

## 2012-11-08 ENCOUNTER — Encounter: Payer: Self-pay | Admitting: Internal Medicine

## 2012-11-08 ENCOUNTER — Ambulatory Visit (INDEPENDENT_AMBULATORY_CARE_PROVIDER_SITE_OTHER): Payer: Medicare Other | Admitting: Internal Medicine

## 2012-11-08 VITALS — BP 150/60 | HR 80 | Temp 98.8°F | Wt 154.0 lb

## 2012-11-08 DIAGNOSIS — D509 Iron deficiency anemia, unspecified: Secondary | ICD-10-CM

## 2012-11-08 DIAGNOSIS — E109 Type 1 diabetes mellitus without complications: Secondary | ICD-10-CM

## 2012-11-08 DIAGNOSIS — I25810 Atherosclerosis of coronary artery bypass graft(s) without angina pectoris: Secondary | ICD-10-CM

## 2012-11-08 NOTE — Assessment & Plan Note (Signed)
Continue insulin pump. Continue followup with endocrinology.

## 2012-11-08 NOTE — Progress Notes (Signed)
Subjective:    Patient ID: [name], female    DOB: 11-Feb-1946, 67 y.o.   MRN: [id number]  HPI  67 year old female with history of hypertension, rheumatoid arthritis, diabetes, and CAD s/p recent CABG who presents for follow up. She was initially admitted 09/22/2012 to [facility] with viral pneumonia. At that time, she was noted to have increased troponin with peak at 12. She declined cardiac cath at that time. After discharged, she continued to have fatigue and dyspnea. She underwent cardiac cath 10/18/2012 which showed severe 3 vessel disease. She was admitted to [facility] and underwent four-vessel bypass surgery with LIMA to the LAD, saphenous vein graft to the diagonal, vein graft to the OM, vein graft to the PDA.  After surgery and discharge, she reports she has generally been feeling well. Pain at her surgical site over her chest has improved. She has had some swelling in her left lower leg but this too seems to be improving. She has been walking with her husband at home 15 minutes twice daily. She reports that symptoms of shortness of breath and fatigue have markedly improved. Her blood sugars continue to be quite labile. She is working with a local endocrinologist and adjusting insulin from her insulin pump to improve blood sugar control. After her hospitalization, she was noted to be anemic with hemoglobin of 7. She has been on an iron supplementation but is having some nausea and constipation with this. She questions whether this could be stopped or changed to an alternative medication.  Outpatient Encounter Prescriptions as of 11/08/2012  Medication Sig Dispense Refill  . aspirin EC 325 MG EC tablet Take 1 tablet (325 mg total) by mouth daily.  30 tablet  0  . atorvastatin (LIPITOR) 20 MG tablet Take 1 tablet (20 mg total) by mouth daily.  90 tablet  2  . Calcium Carbonate Antacid (TUMS PO) Take 4 tablets by mouth daily.        . Cholecalciferol (VITAMIN D) 2000 UNITS CAPS Take 1 capsule by  mouth daily.      . Dihydroxyaluminum Sod Carb (ROLAIDS PO) Take by mouth as needed.        . etanercept (ENBREL) 25 MG injection Inject 25 mg into the skin once a week.      . folic acid (FOLVITE) 1 MG tablet Take 1 tablet (1 mg total) by mouth daily.  90 tablet  3  . furosemide (LASIX) 40 MG tablet Take 1 tablet (40 mg total) by mouth daily.  30 tablet  0  . insulin lispro (HUMALOG) 100 UNIT/ML injection As directed via Insulin pump  30 mL  11  . iron polysaccharides (NIFEREX) 150 MG capsule Take 150 mg by mouth 2 (two) times daily.      Boris Lown Oil 300 MG CAPS Take by mouth daily.      Marland Kitchen losartan (COZAAR) 50 MG tablet Take 1 tablet (50 mg total) by mouth daily.  30 tablet  1  . methotrexate (RHEUMATREX) 2.5 MG tablet Take 4 tablets (10 mg total) by mouth once a week. Takes every Wednesday.Caution:Chemotherapy. Protect from light.  4 tablet  0  . metoprolol (LOPRESSOR) 25 MG tablet Take 0.5 tablets (12.5 mg total) by mouth 2 (two) times daily.  30 tablet  1  . Multiple Vitamin (MULTIVITAMIN) tablet Take 1 tablet by mouth daily.        . pantoprazole (PROTONIX) 40 MG tablet Take one tablet by mouth twice daily.  60 tablet  6  .  potassium chloride SA (K-DUR,KLOR-CON) 20 MEQ tablet Take 1 tablet (20 mEq total) by mouth daily.  30 tablet  1  . pyridOXINE (VITAMIN B-6) 100 MG tablet Take 100 mg by mouth daily.        . traMADol (ULTRAM) 50 MG tablet Take 1 tablet (50 mg total) by mouth every 4 (four) hours as needed for pain.  30 tablet  0  . Triamcinolone Acetonide (TRIAMCINOLONE 0.1 % CREAM : EUCERIN) CREA Apply 1 application topically 2 (two) times daily as needed.  1 each  3  . zoledronic acid (RECLAST) 5 MG/100ML SOLN Inject 5 mg into the vein once.       No facility-administered encounter medications on file as of 11/08/2012.   BP 150/60  Pulse 80  Temp(Src) 98.8 F (37.1 C) (Oral)  Wt 154 lb (69.854 kg)  BMI 26.42 kg/m2  SpO2 97%  Review of Systems  Constitutional: Negative for  fever, chills, appetite change, fatigue and unexpected weight change.  HENT: Negative for ear pain, congestion, sore throat, trouble swallowing, neck pain, voice change and sinus pressure.   Eyes: Negative for visual disturbance.  Respiratory: Negative for cough, shortness of breath, wheezing and stridor.   Cardiovascular: Positive for leg swelling. Negative for chest pain and palpitations.  Gastrointestinal: Negative for nausea, vomiting, abdominal pain, diarrhea, constipation, blood in stool, abdominal distention and anal bleeding.  Genitourinary: Negative for dysuria and flank pain.  Musculoskeletal: Negative for myalgias, arthralgias and gait problem.  Skin: Negative for color change and rash.  Neurological: Negative for dizziness and headaches.  Hematological: Negative for adenopathy. Does not bruise/bleed easily.  Psychiatric/Behavioral: Negative for suicidal ideas, sleep disturbance and dysphoric mood. The patient is not nervous/anxious.        Objective:   Physical Exam  Constitutional: She is oriented to person, place, and time. She appears well-developed and well-nourished. No distress.  HENT:  Head: Normocephalic and atraumatic.  Right Ear: External ear normal.  Left Ear: External ear normal.  Nose: Nose normal.  Mouth/Throat: Oropharynx is clear and moist. No oropharyngeal exudate.  Eyes: Conjunctivae are normal. Pupils are equal, round, and reactive to light. Right eye exhibits no discharge. Left eye exhibits no discharge. No scleral icterus.  Neck: Normal range of motion. Neck supple. No tracheal deviation present. No thyromegaly present.  Cardiovascular: Normal rate, regular rhythm, normal heart sounds and intact distal pulses.  Exam reveals no gallop and no friction rub.   No murmur heard. Pulmonary/Chest: Effort normal. No accessory muscle usage. Not tachypneic. No respiratory distress. She has no decreased breath sounds. She has no wheezes. She has no rhonchi. She has  rales (rare left lower lung). She exhibits no tenderness.    Musculoskeletal: Normal range of motion. She exhibits edema (trace left LE). She exhibits no tenderness.  Lymphadenopathy:    She has no cervical adenopathy.  Neurological: She is alert and oriented to person, place, and time. No cranial nerve deficit. She exhibits normal muscle tone. Coordination normal.  Skin: Skin is warm and dry. No rash noted. She is not diaphoretic. No erythema. No pallor.  Psychiatric: She has a normal mood and affect. Her behavior is normal. Judgment and thought content normal.          Assessment & Plan:

## 2012-11-08 NOTE — Assessment & Plan Note (Signed)
S/p 4v CABG. Doing very well. Continue current medications. Encouraged her to continue with walking. Followup scheduled with her cardiothoracic surgeon next week.

## 2012-11-08 NOTE — Assessment & Plan Note (Signed)
Will recheck hemoglobin today. If unable to tolerate oral iron supplementation, may be a candidate for IV iron supplementation if necessary.

## 2012-11-09 LAB — COMPREHENSIVE METABOLIC PANEL
ALT: 10 U/L (ref 0–35)
Albumin: 3.8 g/dL (ref 3.5–5.2)
Alkaline Phosphatase: 71 U/L (ref 39–117)
Glucose, Bld: 182 mg/dL — ABNORMAL HIGH (ref 70–99)
Potassium: 4.1 mEq/L (ref 3.5–5.1)
Sodium: 133 mEq/L — ABNORMAL LOW (ref 135–145)
Total Bilirubin: 0.7 mg/dL (ref 0.3–1.2)
Total Protein: 7.6 g/dL (ref 6.0–8.3)

## 2012-11-09 LAB — CBC WITH DIFFERENTIAL/PLATELET
Basophils Absolute: 0 10*3/uL (ref 0.0–0.1)
Eosinophils Relative: 3.1 % (ref 0.0–5.0)
Monocytes Absolute: 0.6 10*3/uL (ref 0.1–1.0)
Monocytes Relative: 18.3 % — ABNORMAL HIGH (ref 3.0–12.0)
Neutrophils Relative %: 47.1 % (ref 43.0–77.0)
Platelets: 301 10*3/uL (ref 150.0–400.0)
RDW: 16 % — ABNORMAL HIGH (ref 11.5–14.6)
WBC: 3.1 10*3/uL — ABNORMAL LOW (ref 4.5–10.5)

## 2012-11-15 ENCOUNTER — Other Ambulatory Visit: Payer: Self-pay | Admitting: *Deleted

## 2012-11-15 DIAGNOSIS — I2510 Atherosclerotic heart disease of native coronary artery without angina pectoris: Secondary | ICD-10-CM

## 2012-11-20 ENCOUNTER — Ambulatory Visit
Admission: RE | Admit: 2012-11-20 | Discharge: 2012-11-20 | Disposition: A | Discharge status: Home / Self Care | Payer: Medicare Other | Source: Ambulatory Visit | Attending: Thoracic Surgery (Cardiothoracic Vascular Surgery) | Admitting: Thoracic Surgery (Cardiothoracic Vascular Surgery)

## 2012-11-20 ENCOUNTER — Ambulatory Visit (INDEPENDENT_AMBULATORY_CARE_PROVIDER_SITE_OTHER): Payer: Self-pay | Admitting: Thoracic Surgery (Cardiothoracic Vascular Surgery)

## 2012-11-20 ENCOUNTER — Encounter: Payer: Self-pay | Admitting: Thoracic Surgery (Cardiothoracic Vascular Surgery)

## 2012-11-20 VITALS — BP 156/71 | HR 81 | Resp 20 | Ht 64.0 in | Wt 152.0 lb

## 2012-11-20 DIAGNOSIS — Z09 Encounter for follow-up examination after completed treatment for conditions other than malignant neoplasm: Secondary | ICD-10-CM

## 2012-11-20 DIAGNOSIS — I2510 Atherosclerotic heart disease of native coronary artery without angina pectoris: Secondary | ICD-10-CM

## 2012-11-20 DIAGNOSIS — Z951 Presence of aortocoronary bypass graft: Secondary | ICD-10-CM

## 2012-11-20 NOTE — Patient Instructions (Signed)
Do not lift over 10 pounds for another 2 weeks  You may begin driving with the precautions we discussed  Stop potassium  Take a full tablet of metoprolol twice daily  You may change back to 81 mg aspirin

## 2012-11-20 NOTE — Progress Notes (Signed)
HPI:  [name] returns today for a scheduled postoperative followup visit.  [name] is a 67 year old diabetic woman with coronary bypass grafting x4 on June 20. Her postoperative course was a copy to home on postoperative day #4. She has continued to do well since that time. She hasn't taken any pain medication since the second week she was found. She's not had any anginal discomfort. Her exercise tolerance is gradually improving. Overall she feels well. She has had some stomach upset and constipation related to iron supplements.   She is requesting a change from 325 mg to 81 mg aspirin.  Past Medical History  Diagnosis Date  . Leukopenia 2012    s/p bone marrow biopsy, Dr. Sherrlyn Hock  . Cholelithiasis   . GERD (gastroesophageal reflux disease)   . Hypertension   . Hyperlipidemia   . Esophageal stricture   . Chronic diastolic CHF (congestive heart failure)   . CAD (coronary artery disease)     a. 09/2012 Cath: LM nl, LAD 95p, 20m, LCX 37m, OM2 50, RCA 100.  Marland Kitchen Hypokalemia   . Herniated disc   . Pneumonia 2013; 08/2012    "one lung; double" (10/18/2012)  . Exertional shortness of breath   . Type I diabetes mellitus     "dx'd in 1957" (10/18/2012)  . History of pancytopenia   . H/O hiatal hernia   . Rheumatoid arthritis(714.0)   . NSTEMI (non-ST elevated myocardial infarction) 08/2012    "mild" (10/18/2012)      Current Outpatient Prescriptions  Medication Sig Dispense Refill  . aspirin 81 MG tablet Take 81 mg by mouth daily.      Marland Kitchen atorvastatin (LIPITOR) 20 MG tablet Take 1 tablet (20 mg total) by mouth daily.  90 tablet  2  . Calcium Carbonate Antacid (TUMS PO) Take 4 tablets by mouth daily.        . Cholecalciferol (VITAMIN D) 2000 UNITS CAPS Take 1 capsule by mouth daily.      . Dihydroxyaluminum Sod Carb (ROLAIDS PO) Take by mouth as needed.        . etanercept (ENBREL) 25 MG injection Inject 25 mg into the skin once a week.      . folic acid (FOLVITE) 1 MG tablet Take  1 tablet (1 mg total) by mouth daily.  90 tablet  3  . furosemide (LASIX) 40 MG tablet Take 1 tablet (40 mg total) by mouth daily.  30 tablet  0  . insulin lispro (HUMALOG) 100 UNIT/ML injection As directed via Insulin pump  30 mL  11  . iron polysaccharides (NIFEREX) 150 MG capsule Take 150 mg by mouth 2 (two) times daily.      Boris Lown Oil 300 MG CAPS Take by mouth daily.      Marland Kitchen losartan (COZAAR) 50 MG tablet Take 1 tablet (50 mg total) by mouth daily.  30 tablet  1  . methotrexate (RHEUMATREX) 2.5 MG tablet Take 4 tablets (10 mg total) by mouth once a week. Takes every Wednesday.Caution:Chemotherapy. Protect from light.  4 tablet  0  . metoprolol tartrate (LOPRESSOR) 25 MG tablet Take 25 mg by mouth 2 (two) times daily.      . Multiple Vitamin (MULTIVITAMIN) tablet Take 1 tablet by mouth daily.        . pantoprazole (PROTONIX) 40 MG tablet Take one tablet by mouth twice daily.  60 tablet  6  . pyridOXINE (VITAMIN B-6) 100 MG tablet Take 100 mg by mouth daily.        Marland Kitchen  traMADol (ULTRAM) 50 MG tablet Take 1 tablet (50 mg total) by mouth every 4 (four) hours as needed for pain.  30 tablet  0  . Triamcinolone Acetonide (TRIAMCINOLONE 0.1 % CREAM : EUCERIN) CREA Apply 1 application topically 2 (two) times daily as needed.  1 each  3  . zoledronic acid (RECLAST) 5 MG/100ML SOLN Inject 5 mg into the vein once.       No current facility-administered medications for this visit.    Physical Exam BP 156/71  Pulse 81  Resp 20  Ht 5\' 4"  (1.626 m)  Wt 152 lb (68.947 kg)  BMI 26.08 kg/m2  SpO37 38% 67 year old woman in no acute distress Neurologic alert and oriented x3 Lungs clear equal breath sounds bilaterally Cardiac regular rate and rhythm normal S1 and S2 no rubs murmurs or gallops Incisions healing well Sternum stable No peripheral edema  Diagnostic Tests: *RADIOLOGY REPORT*  Clinical Data: Status post CABG 4 weeks ago.  CHEST - 2 VIEW  Comparison: 10/22/2012  Findings: Mild T12  compression deformity is similar. Prior median  sternotomy.  Midline trachea. Borderline cardiomegaly. Mediastinal contours  otherwise within normal limits. Resolution of previously described  small bilateral pleural effusions. No pneumothorax. No congestive  failure.  Resolved bibasilar airspace disease/atelectasis.  IMPRESSION:  Resolved bilateral pleural effusions and adjacent atelectasis.  Borderline cardiomegaly, without congestive failure or acute  disease.  Similar mild T12 compression deformity.  Original Report Authenticated By: Jeronimo Greaves, M.D.   Impression: 67 year old woman about a month out from coronary bypass grafting x4. She's doing extremely well at this point in time. Her exercise tolerance is good and continues to improve. She has minimal discomfort.  She may begin driving appropriate precautions were discussed.  She's not to lift anything over 10 pounds for another 2 weeks.  I told her to go ahead and change her aspirin dosed 81 mg a day.  Her blood pressure is elevated so we increased her metoprolol to 25 mg twice a day  She's been having a lot of side effects from the iron, I told her she could stop that if continue to cause her problems, it will just take longer for her blood counts to return to normal.  Plan: She will continue to be followed by Dr. Dan Humphreys and Dr. Mariah Milling in [place] I will be happy to see her back any time if I can be of any further assistance with her care.

## 2012-12-03 ENCOUNTER — Other Ambulatory Visit: Payer: Self-pay

## 2012-12-03 MED ORDER — METOPROLOL TARTRATE 25 MG PO TABS: ORAL_TABLET | ORAL | Status: DC

## 2012-12-03 NOTE — Telephone Encounter (Signed)
Refill sent for metoprolol tart 25 mg take 1/2 tablet twice a day prn

## 2012-12-06 ENCOUNTER — Telehealth: Payer: Self-pay

## 2012-12-06 MED ORDER — METOPROLOL TARTRATE 25 MG PO TABS: 25.0000 mg | ORAL_TABLET | Freq: Two times a day (BID) | ORAL | Status: DC | PRN

## 2012-12-06 NOTE — Telephone Encounter (Signed)
Pt takes 1 tablet bid prn. Pt requesting refill sent to [facility].

## 2012-12-06 NOTE — Telephone Encounter (Signed)
Pharmacist has question about metroprolol. Please call

## 2012-12-12 ENCOUNTER — Ambulatory Visit (INDEPENDENT_AMBULATORY_CARE_PROVIDER_SITE_OTHER): Payer: Medicare Other | Admitting: Internal Medicine

## 2012-12-12 ENCOUNTER — Encounter: Payer: Self-pay | Admitting: Internal Medicine

## 2012-12-12 VITALS — BP 140/78 | HR 73 | Temp 98.2°F | Wt 154.0 lb

## 2012-12-12 DIAGNOSIS — I10 Essential (primary) hypertension: Secondary | ICD-10-CM

## 2012-12-12 DIAGNOSIS — E109 Type 1 diabetes mellitus without complications: Secondary | ICD-10-CM

## 2012-12-12 DIAGNOSIS — I25810 Atherosclerosis of coronary artery bypass graft(s) without angina pectoris: Secondary | ICD-10-CM

## 2012-12-12 DIAGNOSIS — E785 Hyperlipidemia, unspecified: Secondary | ICD-10-CM

## 2012-12-12 DIAGNOSIS — D509 Iron deficiency anemia, unspecified: Secondary | ICD-10-CM

## 2012-12-12 LAB — HM DIABETES EYE EXAM

## 2012-12-12 LAB — HM DIABETES FOOT EXAM: HM Diabetic Foot Exam: NORMAL

## 2012-12-12 MED ORDER — LOSARTAN POTASSIUM 50 MG PO TABS: 50.0000 mg | ORAL_TABLET | Freq: Every day | ORAL | Status: DC

## 2012-12-12 NOTE — Assessment & Plan Note (Signed)
Will check LFTs with labs today. Continue Lipitor.  

## 2012-12-12 NOTE — Progress Notes (Signed)
Subjective:    Patient ID: [name], female    DOB: December 02, 1945, 67 y.o.   MRN: [id number]  HPI 67 year old female with history of diabetes on insulin pump, rheumatoid arthritis, coronary artery disease status post CABG, hyperlipidemia presents for followup. She reports she is generally been feeling well. She has increased her physical activity and is walking daily. She has not had any recurrent chest pain, shortness of breath, or fatigue. She reports her blood sugars have been very well-controlled, typically near 80-100 fasting. She notes she has followup with her endocrinologist next week. She was also seen by her cardiothoracic surgeon last week and metoprolol dose was increased to 25 mg twice daily. She denies any side effects noted with this change. She denies any new concerns today.  Outpatient Encounter Prescriptions as of 12/12/2012  Medication Sig Dispense Refill  . aspirin 81 MG tablet Take 81 mg by mouth daily.      Marland Kitchen atorvastatin (LIPITOR) 20 MG tablet Take 1 tablet (20 mg total) by mouth daily.  90 tablet  2  . Calcium Carbonate Antacid (TUMS PO) Take 4 tablets by mouth daily.        . Cholecalciferol (VITAMIN D) 2000 UNITS CAPS Take 1 capsule by mouth daily.      . Dihydroxyaluminum Sod Carb (ROLAIDS PO) Take by mouth as needed.        . etanercept (ENBREL) 25 MG injection Inject 25 mg into the skin once a week.      . folic acid (FOLVITE) 1 MG tablet Take 1 tablet (1 mg total) by mouth daily.  90 tablet  3  . furosemide (LASIX) 40 MG tablet Take 1 tablet (40 mg total) by mouth daily.  30 tablet  0  . insulin lispro (HUMALOG) 100 UNIT/ML injection As directed via Insulin pump  30 mL  11  . Krill Oil 300 MG CAPS Take by mouth daily.      Marland Kitchen losartan (COZAAR) 50 MG tablet Take 1 tablet (50 mg total) by mouth daily.  90 tablet  4  . methotrexate (RHEUMATREX) 2.5 MG tablet Take 4 tablets (10 mg total) by mouth once a week. Takes every Wednesday.Caution:Chemotherapy. Protect from  light.  4 tablet  0  . metoprolol tartrate (LOPRESSOR) 25 MG tablet Take 1 tablet (25 mg total) by mouth 2 (two) times daily as needed.  60 tablet  5  . Multiple Vitamin (MULTIVITAMIN) tablet Take 1 tablet by mouth daily.        . pantoprazole (PROTONIX) 40 MG tablet Take one tablet by mouth twice daily.  60 tablet  6  . pyridOXINE (VITAMIN B-6) 100 MG tablet Take 100 mg by mouth daily.        . Triamcinolone Acetonide (TRIAMCINOLONE 0.1 % CREAM : EUCERIN) CREA Apply 1 application topically 2 (two) times daily as needed.  1 each  3  . zoledronic acid (RECLAST) 5 MG/100ML SOLN Inject 5 mg into the vein once.      . [DISCONTINUED] losartan (COZAAR) 50 MG tablet Take 1 tablet (50 mg total) by mouth daily.  30 tablet  1  . iron polysaccharides (NIFEREX) 150 MG capsule Take 150 mg by mouth 2 (two) times daily.      . traMADol (ULTRAM) 50 MG tablet Take 1 tablet (50 mg total) by mouth every 4 (four) hours as needed for pain.  30 tablet  0   No facility-administered encounter medications on file as of 12/12/2012.   BP 140/78  Pulse  73  Temp(Src) 98.2 F (36.8 C) (Oral)  Wt 154 lb (69.854 kg)  BMI 26.42 kg/m2  SpO2 98%  Review of Systems  Constitutional: Negative for fever, chills, appetite change, fatigue and unexpected weight change.  HENT: Negative for ear pain, congestion, sore throat, trouble swallowing, neck pain, voice change and sinus pressure.   Eyes: Negative for visual disturbance.  Respiratory: Negative for cough, shortness of breath, wheezing and stridor.   Cardiovascular: Negative for chest pain, palpitations and leg swelling.  Gastrointestinal: Negative for nausea, vomiting, abdominal pain, diarrhea, constipation, blood in stool, abdominal distention and anal bleeding.  Genitourinary: Negative for dysuria and flank pain.  Musculoskeletal: Negative for myalgias, arthralgias and gait problem.  Skin: Negative for color change and rash.  Neurological: Negative for dizziness and  headaches.  Hematological: Negative for adenopathy. Does not bruise/bleed easily.  Psychiatric/Behavioral: Negative for suicidal ideas, sleep disturbance and dysphoric mood. The patient is not nervous/anxious.        Objective:   Physical Exam  Constitutional: She is oriented to person, place, and time. She appears well-developed and well-nourished. No distress.  HENT:  Head: Normocephalic and atraumatic.  Right Ear: External ear normal.  Left Ear: External ear normal.  Nose: Nose normal.  Mouth/Throat: Oropharynx is clear and moist. No oropharyngeal exudate.  Eyes: Conjunctivae are normal. Pupils are equal, round, and reactive to light. Right eye exhibits no discharge. Left eye exhibits no discharge. No scleral icterus.  Neck: Normal range of motion. Neck supple. No tracheal deviation present. No thyromegaly present.  Cardiovascular: Normal rate, regular rhythm, normal heart sounds and intact distal pulses.  Exam reveals no gallop and no friction rub.   No murmur heard. Pulmonary/Chest: Effort normal and breath sounds normal. No accessory muscle usage. Not tachypneic. No respiratory distress. She has no decreased breath sounds. She has no wheezes. She has no rhonchi. She has no rales. She exhibits no tenderness.  Musculoskeletal: Normal range of motion. She exhibits no edema and no tenderness.  Lymphadenopathy:    She has no cervical adenopathy.  Neurological: She is alert and oriented to person, place, and time. No cranial nerve deficit. She exhibits normal muscle tone. Coordination normal.  Skin: Skin is warm and dry. No rash noted. She is not diaphoretic. No erythema. No pallor.  Psychiatric: She has a normal mood and affect. Her behavior is normal. Judgment and thought content normal.          Assessment & Plan:

## 2012-12-12 NOTE — Assessment & Plan Note (Signed)
Blood sugars much improved per patient report. Will check A1c with labs today. Followup scheduled with endocrinologist next week. Foot exam normal today.

## 2012-12-12 NOTE — Assessment & Plan Note (Signed)
Recent blood count showed marked improvement. Will repeat a CBC with labs today.

## 2012-12-12 NOTE — Assessment & Plan Note (Signed)
BP Readings from Last 3 Encounters:  12/12/12 140/78  11/20/12 156/71  11/08/12 150/60   Blood pressure has been slightly elevated. Metoprolol was increased to 25 mg twice daily. Will monitor closely.

## 2012-12-12 NOTE — Assessment & Plan Note (Signed)
Doing extremely well postoperatively. No recurrent chest pain, shortness of breath, fatigue. Exercising by walking daily. Encouraged her to continue efforts at exercise and healthy diet. Continue current medications.

## 2012-12-13 LAB — CBC WITH DIFFERENTIAL/PLATELET
Basophils Relative: 0.6 % (ref 0.0–3.0)
Eosinophils Absolute: 0.1 10*3/uL (ref 0.0–0.7)
HCT: 36.7 % (ref 36.0–46.0)
Hemoglobin: 12.3 g/dL (ref 12.0–15.0)
Lymphs Abs: 1 10*3/uL (ref 0.7–4.0)
MCHC: 33.3 g/dL (ref 30.0–36.0)
MCV: 91.7 fl (ref 78.0–100.0)
Monocytes Absolute: 0.4 10*3/uL (ref 0.1–1.0)
Neutro Abs: 1.8 10*3/uL (ref 1.4–7.7)
RBC: 4.01 Mil/uL (ref 3.87–5.11)

## 2012-12-13 LAB — COMPREHENSIVE METABOLIC PANEL
AST: 24 U/L (ref 0–37)
Alkaline Phosphatase: 42 U/L (ref 39–117)
BUN: 12 mg/dL (ref 6–23)
Calcium: 8.8 mg/dL (ref 8.4–10.5)
Creatinine, Ser: 0.7 mg/dL (ref 0.4–1.2)
Glucose, Bld: 175 mg/dL — ABNORMAL HIGH (ref 70–99)

## 2012-12-13 LAB — HEMOGLOBIN A1C: Hgb A1c MFr Bld: 6.7 % — ABNORMAL HIGH (ref 4.6–6.5)

## 2012-12-18 ENCOUNTER — Other Ambulatory Visit: Payer: Self-pay | Admitting: *Deleted

## 2012-12-18 DIAGNOSIS — I10 Essential (primary) hypertension: Secondary | ICD-10-CM

## 2012-12-18 MED ORDER — LOSARTAN POTASSIUM 50 MG PO TABS: 50.0000 mg | ORAL_TABLET | Freq: Every day | ORAL | Status: DC

## 2012-12-18 NOTE — Telephone Encounter (Signed)
Refilled Losartan sent to [facility] pharmacy.

## 2012-12-21 LAB — HM DIABETES EYE EXAM

## 2013-01-08 ENCOUNTER — Telehealth: Payer: Self-pay | Admitting: *Deleted

## 2013-01-08 NOTE — Telephone Encounter (Signed)
Fine to send this refill.

## 2013-01-08 NOTE — Telephone Encounter (Signed)
Ok to send in this refill?

## 2013-01-08 NOTE — Telephone Encounter (Signed)
Refill Request  Ibuprofen 800 mg tab  #270   Take One tablet by mouth 3 times a day as needed

## 2013-01-09 MED ORDER — IBUPROFEN 800 MG PO TABS: 800.0000 mg | ORAL_TABLET | Freq: Three times a day (TID) | ORAL | Status: DC | PRN

## 2013-01-09 NOTE — Addendum Note (Signed)
Addended by: Theola Sequin on: 01/09/2013 05:22 PM   Modules accepted: Orders

## 2013-01-09 NOTE — Telephone Encounter (Signed)
Rx sent 

## 2013-01-31 ENCOUNTER — Other Ambulatory Visit: Payer: Self-pay | Admitting: *Deleted

## 2013-01-31 MED ORDER — FUROSEMIDE 40 MG PO TABS: 40.0000 mg | ORAL_TABLET | Freq: Every day | ORAL | Status: DC

## 2013-02-04 ENCOUNTER — Ambulatory Visit (INDEPENDENT_AMBULATORY_CARE_PROVIDER_SITE_OTHER): Payer: Medicare Other | Admitting: Cardiovascular Disease

## 2013-02-04 ENCOUNTER — Encounter: Payer: Self-pay | Admitting: Cardiovascular Disease

## 2013-02-04 VITALS — BP 122/58 | HR 73 | Ht 64.0 in | Wt 156.2 lb

## 2013-02-04 DIAGNOSIS — E785 Hyperlipidemia, unspecified: Secondary | ICD-10-CM

## 2013-02-04 DIAGNOSIS — I25810 Atherosclerosis of coronary artery bypass graft(s) without angina pectoris: Secondary | ICD-10-CM

## 2013-02-04 DIAGNOSIS — E109 Type 1 diabetes mellitus without complications: Secondary | ICD-10-CM

## 2013-02-04 DIAGNOSIS — I10 Essential (primary) hypertension: Secondary | ICD-10-CM

## 2013-02-04 NOTE — Assessment & Plan Note (Signed)
Much improved. We have encouraged continued exercise, careful diet management in an effort to lose weight.

## 2013-02-04 NOTE — Progress Notes (Signed)
Patient ID: [name], female    DOB: [birth year], 67 y.o.   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 She had spiking fevers, chills, severe shortness of breath. Sugars were in the 400s. She had a long course of antibiotics to her hospital visit. Secondary to significant cough, she was unable to lie flat for cardiac catheterization. She declined cardiac catheterization on the same hospital visit. On admission to the hospital, her immunosuppressants, Enbrel and methotrexate were held  She had cardiac catheterization in followup after her pneumonia resolved. This showed severe three-vessel CAD. She was transferred from [facility] to [place] for bypass surgery. She had four-vessel bypass surgery with LIMA to the LAD, saphenous vein graft to the diagonal, vein graft to the OM, vein graft to the PDA. She reports relatively uneventful surgery. She was admitted on June 19, discharge June 24.   In followup today, she reports that she is doing very well. She is tolerating her medications without significant side effects. She is exercising at [employer] as part of the [employer], on a regular basis. Notes indicate she has seen Lavenia Atlas for her rheumatoid arthritis, on Enbrel and methotrexate. She denies any significant chest pain or shortness of breath.  Previous  Echocardiogram 8/24 2014 showed normal ejection fraction with no wall motion abnormality, normal right ventricular size and function, moderately elevated right ventricular systolic pressures.  Cardiac catheterization showed 100% proximally occluded RCA, critical ostial LAD disease, severe mid LAD disease, severe mid left circumflex disease,  ejection fraction estimated at 50-55% with mild hypokinesis of the inferior wall.  EKG shows normal sinus rhythm with rate 73 beats per minute , poor R-wave progression to the anterior precordial leads, nonspecific ST abnormalities in V5 and V6   Outpatient Encounter Prescriptions as of 02/04/2013  Medication Sig Dispense Refill  . aspirin 81 MG tablet Take 81 mg by mouth daily.      Marland Kitchen atorvastatin (LIPITOR) 20 MG tablet Take 1 tablet (20 mg total) by mouth daily.  90 tablet  2  . Cholecalciferol (VITAMIN D) 2000 UNITS CAPS Take 1 capsule by mouth daily.      . Dihydroxyaluminum Sod Carb (ROLAIDS PO) Take by mouth as needed.        . etanercept (ENBREL) 25 MG injection Inject 25 mg into the skin once a week.      . folic acid (FOLVITE) 1 MG tablet Take 1 tablet (1 mg total) by mouth daily.  90 tablet  3  . furosemide (LASIX) 40 MG tablet Take 1 tablet (40 mg total) by mouth daily.  30 tablet  5  . ibuprofen (ADVIL,MOTRIN) 800 MG tablet Take 1 tablet (800 mg total) by mouth 3 (three) times daily as needed.  270 tablet  1  . insulin lispro (HUMALOG) 100 UNIT/ML injection As directed via Insulin pump  30 mL  11  . iron polysaccharides (NIFEREX) 150 MG capsule Take 150 mg by mouth 2 (two) times daily.      Boris Lown Oil 300 MG CAPS Take by mouth daily.      Marland Kitchen losartan (COZAAR) 50 MG tablet Take 1 tablet (50 mg total) by mouth daily.  90 tablet  3  .  methotrexate (RHEUMATREX) 2.5 MG tablet Take 4 tablets (10 mg total) by mouth once a week. Takes every Wednesday.Caution:Chemotherapy. Protect from light.  4 tablet  0  . metoprolol tartrate (LOPRESSOR) 25 MG tablet Take 1 tablet (25 mg total) by mouth 2 (two) times daily as needed.  60 tablet  5  . Multiple Vitamin (MULTIVITAMIN) tablet Take 1 tablet by mouth daily.        . pantoprazole (PROTONIX) 40 MG tablet Take one tablet by mouth twice daily.  60 tablet  6  . pyridOXINE (VITAMIN B-6) 100 MG tablet Take 100 mg by mouth daily.        . zoledronic  acid (RECLAST) 5 MG/100ML SOLN Inject 5 mg into the vein once.      . [DISCONTINUED] Calcium Carbonate Antacid (TUMS PO) Take 4 tablets by mouth daily.        . [DISCONTINUED] traMADol (ULTRAM) 50 MG tablet Take 1 tablet (50 mg total) by mouth every 4 (four) hours as needed for pain.  30 tablet  0  . [DISCONTINUED] Triamcinolone Acetonide (TRIAMCINOLONE 0.1 % CREAM : EUCERIN) CREA Apply 1 application topically 2 (two) times daily as needed.  1 each  3   No facility-administered encounter medications on file as of 02/04/2013.    Review of Systems  Constitutional: Negative.   HENT: Negative.   Eyes: Negative.   Respiratory: Negative.   Gastrointestinal: Negative.   Endocrine: Negative.   Musculoskeletal: Positive for arthralgias.  Skin: Negative.   Allergic/Immunologic: Negative.   Neurological: Negative.   Hematological: Negative.   Psychiatric/Behavioral: Negative.   All other systems reviewed and are negative.    BP 122/58  Pulse 73  Ht 5\' 4"  (1.626 m)  Wt 156 lb 4 oz (70.875 kg)  BMI 26.81 kg/m2  Physical Exam  Nursing note and vitals reviewed. Constitutional: She is oriented to person, place, and time. She appears well-developed and well-nourished.  HENT:  Head: Normocephalic.  Nose: Nose normal.  Mouth/Throat: Oropharynx is clear and moist.  Eyes: Conjunctivae are normal. Pupils are equal, round, and reactive to light.  Neck: Normal range of motion. Neck supple. No JVD present.  Cardiovascular: Normal rate, regular rhythm, S1 normal, S2 normal, normal heart sounds and intact distal pulses.  Exam reveals no gallop and no friction rub.   No murmur heard. Well healing mediastinal incision  Pulmonary/Chest: Effort normal and breath sounds normal. No respiratory distress. She has no wheezes. She has no rales. She exhibits no tenderness.  Scant Rales at the bases bilaterally  Abdominal: Soft. Bowel sounds are normal. She exhibits no distension. There is no tenderness.   Musculoskeletal: Normal range of motion. She exhibits no edema and no tenderness.  Ecchymosis of the left upper thigh, medial aspect. Trace to mild edema of the left lower extremity.  Lymphadenopathy:    She has no cervical adenopathy.  Neurological: She is alert and oriented to person, place, and time. Coordination normal.  Skin: Skin is warm and dry. No rash noted. No erythema.  Psychiatric: She has a normal mood and affect. Her behavior is normal. Judgment and thought content normal.    Assessment and Plan

## 2013-02-04 NOTE — Assessment & Plan Note (Signed)
Blood pressure is well controlled on today's visit. No changes made to the medications. 

## 2013-02-04 NOTE — Assessment & Plan Note (Signed)
Currently with no symptoms of angina. No further workup at this time. Continue current medication regimen. 

## 2013-02-04 NOTE — Assessment & Plan Note (Signed)
We'll place an order for a cholesterol panel. We have suggested she call our office and come in for fasting lipids at her convenience

## 2013-02-04 NOTE — Patient Instructions (Addendum)
You are doing well. No medication changes were made.  Please call us if you have new issues that need to be addressed before your next appt.  Your physician wants you to follow-up in: 6 months.  You will receive a reminder letter in the mail two months in advance. If you don't receive a letter, please call our office to schedule the follow-up appointment.   

## 2013-02-05 ENCOUNTER — Other Ambulatory Visit: Payer: Self-pay | Admitting: *Deleted

## 2013-02-05 MED ORDER — FOLIC ACID 1 MG PO TABS: 1.0000 mg | ORAL_TABLET | Freq: Every day | ORAL | Status: DC

## 2013-02-18 ENCOUNTER — Encounter: Payer: Self-pay | Admitting: Internal Medicine

## 2013-02-19 ENCOUNTER — Encounter: Payer: Self-pay | Admitting: Internal Medicine

## 2013-02-19 ENCOUNTER — Ambulatory Visit (INDEPENDENT_AMBULATORY_CARE_PROVIDER_SITE_OTHER): Payer: Medicare Other | Admitting: Internal Medicine

## 2013-02-19 VITALS — BP 154/72 | HR 67 | Ht 64.0 in | Wt 160.0 lb

## 2013-02-19 DIAGNOSIS — R195 Other fecal abnormalities: Secondary | ICD-10-CM

## 2013-02-19 DIAGNOSIS — R1310 Dysphagia, unspecified: Secondary | ICD-10-CM

## 2013-02-19 DIAGNOSIS — K219 Gastro-esophageal reflux disease without esophagitis: Secondary | ICD-10-CM

## 2013-02-19 DIAGNOSIS — B3781 Candidal esophagitis: Secondary | ICD-10-CM

## 2013-02-19 MED ORDER — SACCHAROMYCES BOULARDII 250 MG PO CAPS: 250.0000 mg | ORAL_CAPSULE | Freq: Two times a day (BID) | ORAL | Status: DC

## 2013-02-19 MED ORDER — FLUCONAZOLE 100 MG PO TABS: 100.0000 mg | ORAL_TABLET | Freq: Every day | ORAL | Status: DC

## 2013-02-19 MED ORDER — PANTOPRAZOLE SODIUM 40 MG PO TBEC: DELAYED_RELEASE_TABLET | ORAL | Status: DC

## 2013-02-19 NOTE — Progress Notes (Signed)
Subjective:    Patient ID: [name], female    DOB: 1945/11/16, 67 y.o.   MRN: [id number]  HPI [name] is a 67 yo female with PMH of rheumatoid arthritis, diabetes, GERD with history of erosive esophagitis, hypertension, hyperlipidemia, Schatzki's ring, history of candidal esophagitis, and recent diagnosis of CAD status post three-vessel CABG who seen in followup. She is here alone today he was last seen in May of 2014. After her last visit she was treated with 3 weeks of oral fluconazole for presumed Candida esophagitis. She reports complete improvement in her dysphagia symptoms after this treatment.   Since last being seen here she was admitted for double pneumonia in June of 2014 and at that time she was found to have a small N-STEMI. This led to a cardiac catheterization and eventual three-vessel CABG on 10/20/2012. She has recovered very well and uneventfully since the surgery.  Over last 2-3 weeks she has noticed very mild recurrence of her dysphagia symptoms. This is most notable with solid foods such as breads and meats. This is similar to her symptoms when she was last treated for Candida esophagitis, though milder to this point. No dysphagia. No abdominal pain. No nausea or vomiting. She does notice some nocturnal heartburn though she continues on pantoprazole 40 mg twice daily. She is avoiding eating late at night and trying to modify her diet which helps with her reflux. She has noticed a slight change in her bowel habits after antibiotics in June for pneumonia and her heart surgery. She has some loose stools at other times they're firm. She seen no blood or melena. She does occasionally feel slight rectal pressure after defecation.  She did recently fall when walking on unsteady ground and going to her grandson's soccer game. She fell onto her right knee which has been sore since. It is improving  Review of Systems As per history of present illness, otherwise negative  Current  Medications, Allergies, Past Medical History, Past Surgical History, Family History and Social History were reviewed in [employer] record.     Objective:   Physical Exam BP 154/72  Pulse 67  Ht 5\' 4"  (1.626 m)  Wt 160 lb (72.576 kg)  BMI 27.45 kg/m2 Constitutional: Well-developed and well-nourished. No distress. HEENT: Normocephalic and atraumatic. Oropharynx is clear and moist. No oropharyngeal exudate. Conjunctivae are normal.  No scleral icterus. Cardiovascular: Normal rate, regular rhythm and intact distal pulses.  Pulmonary/chest: Effort normal and breath sounds normal. No wheezing, rales or rhonchi. Abdominal: Soft, nontender, nondistended. Bowel sounds active throughout.  Extremities: no clubbing, cyanosis, or edema, bruising of the right knee extending down the anterior and lateral right lower leg   Neurological: Alert and oriented to person place and time. Skin: Skin is warm and dry. No rashes noted. Psychiatric: Normal mood and affect. Behavior is normal.  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 Successfully dilated with 54 French Maloney dilator. Exam otherwise unremarkable. Small hiatus hernia.  EGD 06/29/2011 - moderately severe Candida esophagitis, benign-appearing esophageal stricture. Dilated with TTS balloon 18 mm. Otherwise normal examination  EGD 05/12/2010 - LA grade D. reflux esophagitis, low-grade narrowing Schatzki's ring, gastritis, normal duodenum.  Colonoscopy 05/11/2009 - exam to the cecum, bowel preparation was good. Internal hemorrhoids, otherwise normal.     Assessment & Plan:  67 yo female with PMH of rheumatoid arthritis, diabetes, GERD with history of erosive esophagitis, hypertension, hyperlipidemia, Schatzki's ring, history of candidal esophagitis, and recent  diagnosis of CAD status post three-vessel CABG who seen in followup.  1.  Dysphagia/history of Candida esophagitis/history of  stricture -- each time over the last year that she has developed dysphagia she has improved completely with fluconazole. She previously had multiple endoscopies with dilation which has not necessarily improved dysphagia. At this point, given recurrence of symptoms I will retreat him. We for Candida esophagitis with fluconazole 100 mg daily x21 days. If this fails to improve her symptoms completely, she will need upper endoscopy. She understands this recommendation and will call me if her symptoms do not fully improve. I have asked that she hold her statin entirely while on fluconazole due to the risk of rhabdomyolysis. She understands this recommendation.  2.  GERD -- continue pantoprazole at current dose. GERD diet/hygiene and avoid lying down within 2 hours of eating or drinking whenever possible  3.  Loose stools -- this may be secondary to antibiotics around the time of her pneumonia and heart surgery. She is up-to-date with her colonoscopy for screening standpoint. I have recommended a probiotic, specifically Florastor 250 mg BID.  I've asked that she wait to take this until after she has completed fluconazole. If this fails to return her bowel habits are normal, she is asked to notify me if she voices understanding.

## 2013-02-19 NOTE — Patient Instructions (Signed)
We have sent the following medications to your pharmacy for you to pick up at your convenience: Diflucan daily for 21 days.. You will need to hold your lipitor during this time. If you are not comfortable with this you can contact your cadiologist.  Start taking Florastore daily.   Call us if you are not feeling better.                                               We are excited to introduce MyChart, a new best-in-class service that provides you online access to important information in your electronic medical record. We want to make it easier for you to view your health information - all in one secure location - when and where you need it. We expect MyChart will enhance the quality of care and service we provide.  When you register for MyChart, you can:    View your test results.    Request appointments and receive appointment reminders via email.    Request medication renewals.    View your medical history, allergies, medications and immunizations.    Communicate with your physician's office through a password-protected site.    Conveniently print information such as your medication lists.  To find out if MyChart is right for you, please talk to a member of our clinical staff today. We will gladly answer your questions about this free health and wellness tool.  If you are age 70 or older and want a member of your family to have access to your record, you must provide written consent by completing a proxy form available at our office. Please speak to our clinical staff about guidelines regarding accounts for patients younger than age 15.  As you activate your MyChart account and need any technical assistance, please call the MyChart technical support line at [phone]) or email your question to [email or web address]. If you email your question(s), please include your name, a return phone number and the best time to reach you.  If you have non-urgent  health-related questions, you can send a message to our office through MyChart at [email or web address]. If you have a medical emergency, call 911.  Thank you for using MyChart as your new health and wellness resource!   MyChart licensed from [employer],  9811-9147. Patents Pending.

## 2013-02-22 ENCOUNTER — Telehealth: Payer: Self-pay

## 2013-02-22 NOTE — Telephone Encounter (Signed)
Received fax from cardiac rehab at [facility] that pt missed appt, "cost of program is a financial hardship"

## 2013-03-07 ENCOUNTER — Other Ambulatory Visit: Payer: Self-pay

## 2013-03-20 ENCOUNTER — Ambulatory Visit: Payer: Medicare Other | Admitting: Internal Medicine

## 2013-03-21 ENCOUNTER — Encounter: Payer: Self-pay | Admitting: Internal Medicine

## 2013-03-21 ENCOUNTER — Ambulatory Visit (INDEPENDENT_AMBULATORY_CARE_PROVIDER_SITE_OTHER): Payer: Medicare Other | Admitting: Internal Medicine

## 2013-03-21 VITALS — BP 148/66 | HR 73 | Temp 98.0°F | Wt 158.0 lb

## 2013-03-21 DIAGNOSIS — E109 Type 1 diabetes mellitus without complications: Secondary | ICD-10-CM

## 2013-03-21 DIAGNOSIS — K219 Gastro-esophageal reflux disease without esophagitis: Secondary | ICD-10-CM

## 2013-03-21 DIAGNOSIS — I2510 Atherosclerotic heart disease of native coronary artery without angina pectoris: Secondary | ICD-10-CM

## 2013-03-21 MED ORDER — PANTOPRAZOLE SODIUM 40 MG PO TBEC: DELAYED_RELEASE_TABLET | ORAL | Status: DC

## 2013-03-21 MED ORDER — FUROSEMIDE 40 MG PO TABS: 40.0000 mg | ORAL_TABLET | Freq: Two times a day (BID) | ORAL | Status: DC

## 2013-03-21 NOTE — Assessment & Plan Note (Signed)
Lab Results  Component Value Date   HGBA1C 6.7* 12/12/2012   BG well controlled on Humalog. Will follow up with Dr. Tedd Sias next month. Now that Dr. Tedd Sias is following BG, will decrease follow up here to q6 months.

## 2013-03-21 NOTE — Progress Notes (Signed)
Subjective:    Patient ID: [name], female    DOB: 1946/01/16, 67 y.o.   MRN: [id number]  HPI 67 year old female with history of type 1 diabetes on insulin pump, coronary artery disease status post CABG, rheumatoid arthritis, hyperlipidemia, hypertension presents for followup. She reports that she is generally been feeling well. She has started an exercise program at a local gym. She denies any chest pain, shortness of breath, palpitations. She does occasionally have lower extremity swelling and typically takes furosemide 40 mg once or twice per day with resolution. She reports that blood sugars have been well-controlled. She is scheduled to have repeat hemoglobin A1c with her endocrinologist next month. She denies any recent low blood sugars less than 70 or blood sugars greater than 200. No new concerns today.  Outpatient Encounter Prescriptions as of 03/21/2013  Medication Sig  . aspirin 81 MG tablet Take 81 mg by mouth daily.  Marland Kitchen atorvastatin (LIPITOR) 20 MG tablet Take 1 tablet (20 mg total) by mouth daily.  . Cholecalciferol (VITAMIN D) 2000 UNITS CAPS Take 1 capsule by mouth daily.  Marland Kitchen etanercept (ENBREL) 25 MG injection Inject 25 mg into the skin once a week.  . folic acid (FOLVITE) 1 MG tablet Take 1 tablet (1 mg total) by mouth daily.  . furosemide (LASIX) 40 MG tablet Take 1 tablet (40 mg total) by mouth 2 (two) times daily.  Marland Kitchen ibuprofen (ADVIL,MOTRIN) 800 MG tablet Take 1 tablet (800 mg total) by mouth 3 (three) times daily as needed.  . insulin lispro (HUMALOG) 100 UNIT/ML injection As directed via Insulin pump  . losartan (COZAAR) 50 MG tablet Take 1 tablet (50 mg total) by mouth daily.  . methotrexate (RHEUMATREX) 2.5 MG tablet Take 4 tablets (10 mg total) by mouth once a week. Takes every Wednesday.Caution:Chemotherapy. Protect from light.  . metoprolol tartrate (LOPRESSOR) 25 MG tablet Take 1 tablet (25 mg total) by mouth 2 (two) times daily as needed.  . Multiple Vitamin  (MULTIVITAMIN) tablet Take 1 tablet by mouth daily.    . pantoprazole (PROTONIX) 40 MG tablet Take one tablet by mouth twice daily.  Marland Kitchen saccharomyces boulardii (FLORASTOR) 250 MG capsule Take 1 capsule (250 mg total) by mouth 2 (two) times daily.  . zoledronic acid (RECLAST) 5 MG/100ML SOLN Inject 5 mg into the vein once.   BP 148/66  Pulse 73  Temp(Src) 98 F (36.7 C) (Oral)  Wt 158 lb (71.668 kg)  SpO2 97%  Review of Systems  Constitutional: Negative for fever, chills, appetite change, fatigue and unexpected weight change.  HENT: Negative for congestion, ear pain, sinus pressure, sore throat, trouble swallowing and voice change.   Eyes: Negative for visual disturbance.  Respiratory: Negative for cough, shortness of breath, wheezing and stridor.   Cardiovascular: Negative for chest pain, palpitations and leg swelling.  Gastrointestinal: Negative for nausea, vomiting, abdominal pain, diarrhea, constipation, blood in stool, abdominal distention and anal bleeding.  Genitourinary: Negative for dysuria and flank pain.  Musculoskeletal: Negative for arthralgias, gait problem, myalgias and neck pain.  Skin: Negative for color change and rash.  Neurological: Negative for dizziness and headaches.  Hematological: Negative for adenopathy. Does not bruise/bleed easily.  Psychiatric/Behavioral: Negative for suicidal ideas, sleep disturbance and dysphoric mood. The patient is not nervous/anxious.        Objective:   Physical Exam  Constitutional: She is oriented to person, place, and time. She appears well-developed and well-nourished. No distress.  HENT:  Head: Normocephalic and atraumatic.  Right Ear:  External ear normal.  Left Ear: External ear normal.  Nose: Nose normal.  Mouth/Throat: Oropharynx is clear and moist. No oropharyngeal exudate.  Eyes: Conjunctivae are normal. Pupils are equal, round, and reactive to light. Right eye exhibits no discharge. Left eye exhibits no discharge. No  scleral icterus.  Neck: Normal range of motion. Neck supple. No tracheal deviation present. No thyromegaly present.  Cardiovascular: Normal rate, regular rhythm, normal heart sounds and intact distal pulses.  Exam reveals no gallop and no friction rub.   No murmur heard. Pulmonary/Chest: Effort normal and breath sounds normal. No accessory muscle usage. Not tachypneic. No respiratory distress. She has no decreased breath sounds. She has no wheezes. She has no rhonchi. She has no rales. She exhibits no tenderness.  Musculoskeletal: Normal range of motion. She exhibits no edema and no tenderness.  Lymphadenopathy:    She has no cervical adenopathy.  Neurological: She is alert and oriented to person, place, and time. No cranial nerve deficit. She exhibits normal muscle tone. Coordination normal.  Skin: Skin is warm and dry. No rash noted. She is not diaphoretic. No erythema. No pallor.  Psychiatric: She has a normal mood and affect. Her behavior is normal. Judgment and thought content normal.          Assessment & Plan:

## 2013-03-21 NOTE — Progress Notes (Signed)
Pre-visit discussion using our clinic review tool. No additional management support is needed unless otherwise documented below in the visit note.  

## 2013-03-21 NOTE — Assessment & Plan Note (Signed)
Symptoms well controlled on pantoprazole. Will continue.

## 2013-03-21 NOTE — Assessment & Plan Note (Signed)
Symptomatically doing very well after CABG. Encouraged continued exercise as tolerated. Continue current medications. Follow up with cardiology as scheduled.

## 2013-04-02 ENCOUNTER — Other Ambulatory Visit (INDEPENDENT_AMBULATORY_CARE_PROVIDER_SITE_OTHER): Payer: Medicare Other

## 2013-04-02 DIAGNOSIS — I25810 Atherosclerosis of coronary artery bypass graft(s) without angina pectoris: Secondary | ICD-10-CM

## 2013-04-02 DIAGNOSIS — E785 Hyperlipidemia, unspecified: Secondary | ICD-10-CM

## 2013-04-03 LAB — LIPID PANEL
Chol/HDL Ratio: 3.1 ratio units (ref 0.0–4.4)
HDL: 87 mg/dL (ref >39)
LDL Calculated: 169 mg/dL — ABNORMAL HIGH (ref 0–99)
VLDL Cholesterol Cal: 10 mg/dL (ref 5–40)

## 2013-04-17 ENCOUNTER — Telehealth: Payer: Self-pay

## 2013-04-17 NOTE — Telephone Encounter (Signed)
Pt would like lab results.  

## 2013-04-17 NOTE — Telephone Encounter (Signed)
Spoke w/ pt.  She is aware of results and states that she will continue on her Lipitor unless instructed otherwise.

## 2013-04-30 ENCOUNTER — Other Ambulatory Visit: Payer: Self-pay | Admitting: *Deleted

## 2013-04-30 DIAGNOSIS — E785 Hyperlipidemia, unspecified: Secondary | ICD-10-CM

## 2013-04-30 MED ORDER — ATORVASTATIN CALCIUM 20 MG PO TABS: 20.0000 mg | ORAL_TABLET | Freq: Every day | ORAL | Status: DC

## 2013-06-04 ENCOUNTER — Other Ambulatory Visit: Payer: Self-pay | Admitting: *Deleted

## 2013-06-04 MED ORDER — METOPROLOL TARTRATE 25 MG PO TABS: 25.0000 mg | ORAL_TABLET | Freq: Two times a day (BID) | ORAL | Status: DC | PRN

## 2013-06-04 NOTE — Telephone Encounter (Signed)
Requested Prescriptions   Signed Prescriptions Disp Refills  . metoprolol tartrate (LOPRESSOR) 25 MG tablet 60 tablet 3    Sig: Take 1 tablet (25 mg total) by mouth 2 (two) times daily as needed.    Authorizing Provider: Minna Merritts    Ordering User: Britt Bottom

## 2013-06-17 ENCOUNTER — Telehealth: Payer: Self-pay | Admitting: Emergency Medicine

## 2013-06-17 NOTE — Telephone Encounter (Signed)
Pt approved to see Dr. Jefm Bryant with 4 visits exp 09/09/13. auth # [id number]

## 2013-07-10 ENCOUNTER — Other Ambulatory Visit: Payer: Self-pay | Admitting: *Deleted

## 2013-07-10 MED ORDER — METOPROLOL TARTRATE 25 MG PO TABS: 25.0000 mg | ORAL_TABLET | Freq: Two times a day (BID) | ORAL | Status: DC | PRN

## 2013-07-10 NOTE — Telephone Encounter (Signed)
Requested Prescriptions   Signed Prescriptions Disp Refills  . metoprolol tartrate (LOPRESSOR) 25 MG tablet 180 tablet 3    Sig: Take 1 tablet (25 mg total) by mouth 2 (two) times daily as needed.    Authorizing Provider: Minna Merritts    Ordering User: Britt Bottom

## 2013-07-11 ENCOUNTER — Other Ambulatory Visit: Payer: Self-pay | Admitting: *Deleted

## 2013-07-11 DIAGNOSIS — K222 Esophageal obstruction: Secondary | ICD-10-CM

## 2013-07-11 DIAGNOSIS — E785 Hyperlipidemia, unspecified: Secondary | ICD-10-CM

## 2013-07-11 DIAGNOSIS — I2510 Atherosclerotic heart disease of native coronary artery without angina pectoris: Secondary | ICD-10-CM

## 2013-07-11 DIAGNOSIS — I10 Essential (primary) hypertension: Secondary | ICD-10-CM

## 2013-07-11 DIAGNOSIS — K219 Gastro-esophageal reflux disease without esophagitis: Secondary | ICD-10-CM

## 2013-07-11 MED ORDER — PANTOPRAZOLE SODIUM 40 MG PO TBEC: DELAYED_RELEASE_TABLET | ORAL | Status: DC

## 2013-07-11 MED ORDER — FOLIC ACID 1 MG PO TABS: 1.0000 mg | ORAL_TABLET | Freq: Every day | ORAL | Status: DC

## 2013-07-11 MED ORDER — LOSARTAN POTASSIUM 50 MG PO TABS: 50.0000 mg | ORAL_TABLET | Freq: Every day | ORAL | Status: DC

## 2013-07-11 MED ORDER — FUROSEMIDE 40 MG PO TABS: 40.0000 mg | ORAL_TABLET | Freq: Two times a day (BID) | ORAL | Status: DC

## 2013-07-11 MED ORDER — ATORVASTATIN CALCIUM 20 MG PO TABS: 20.0000 mg | ORAL_TABLET | Freq: Every day | ORAL | Status: DC

## 2013-07-11 NOTE — Telephone Encounter (Signed)
Prescriptions faxed to [facility]

## 2013-08-14 ENCOUNTER — Ambulatory Visit: Payer: Medicare Other | Admitting: Cardiovascular Disease

## 2013-08-15 ENCOUNTER — Encounter: Payer: Self-pay | Admitting: Cardiovascular Disease

## 2013-08-15 ENCOUNTER — Ambulatory Visit (INDEPENDENT_AMBULATORY_CARE_PROVIDER_SITE_OTHER): Payer: Medicare HMO | Admitting: Cardiovascular Disease

## 2013-08-15 VITALS — BP 178/70 | HR 67 | Ht 64.0 in | Wt 167.5 lb

## 2013-08-15 DIAGNOSIS — I25810 Atherosclerosis of coronary artery bypass graft(s) without angina pectoris: Secondary | ICD-10-CM

## 2013-08-15 DIAGNOSIS — E785 Hyperlipidemia, unspecified: Secondary | ICD-10-CM

## 2013-08-15 DIAGNOSIS — E109 Type 1 diabetes mellitus without complications: Secondary | ICD-10-CM

## 2013-08-15 DIAGNOSIS — I2510 Atherosclerotic heart disease of native coronary artery without angina pectoris: Secondary | ICD-10-CM

## 2013-08-15 DIAGNOSIS — I10 Essential (primary) hypertension: Secondary | ICD-10-CM

## 2013-08-15 MED ORDER — LOSARTAN POTASSIUM 100 MG PO TABS: 100.0000 mg | ORAL_TABLET | Freq: Every day | ORAL | Status: DC

## 2013-08-15 NOTE — Assessment & Plan Note (Signed)
Currently with no symptoms of angina. No further workup at this time. Continue current medication regimen. 

## 2013-08-15 NOTE — Assessment & Plan Note (Signed)
We have encouraged continued exercise, careful diet management in an effort to lose weight. 

## 2013-08-15 NOTE — Progress Notes (Signed)
Patient ID: [name], female    DOB: Aug 08, 1945, 68 y.o.   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 She had spiking fevers, chills, severe shortness of breath. Sugars were in the 400s. She had a long course of antibiotics to her hospital visit. Secondary to significant cough, she was unable to lie flat for cardiac catheterization. She declined cardiac catheterization on the same hospital visit. On admission to the hospital, her immunosuppressants, Enbrel and methotrexate were held  She had cardiac catheterization in followup after her pneumonia resolved. This showed severe three-vessel CAD. She was transferred from [facility] to [place] for bypass surgery. She had four-vessel bypass surgery with LIMA to the LAD, saphenous vein graft to the diagonal, vein graft to the OM, vein graft to the PDA. She reports relatively uneventful surgery. She was admitted on June 19, discharge June 24.   In followup today, she reports that she is doing very well. She is tolerating her medications without significant side effects. Her blood pressure has been running high. Medications for rheumatoid arthritis has caused mild leg swelling. She denies having any shortness of breath, chest pain. Otherwise is active.  she has seen Precious Reel for her rheumatoid arthritis, on Enbrel and methotrexate.   Previous  Echocardiogram 8/24 2014 showed normal ejection fraction with no wall motion abnormality, normal right ventricular size and function, moderately elevated right ventricular systolic pressures.  Cardiac catheterization showed 100% proximally occluded RCA, critical ostial LAD disease, severe mid LAD  disease, severe mid left circumflex disease, ejection fraction estimated at 50-55% with mild hypokinesis of the inferior wall.  EKG shows normal sinus rhythm with rate 67 beats per minute , poor R-wave progression to the anterior precordial leads, nonspecific ST abnormalities in V5 and V6   Outpatient Encounter Prescriptions as of 08/15/2013  Medication Sig  . aspirin 81 MG tablet Take 81 mg by mouth daily.  Marland Kitchen atorvastatin (LIPITOR) 20 MG tablet Take 1 tablet (20 mg total) by mouth daily.  . Cholecalciferol (VITAMIN D) 2000 UNITS CAPS Take 1 capsule by mouth daily.  Marland Kitchen etanercept (ENBREL) 25 MG injection Inject 25 mg into the skin once a week.  . folic acid (FOLVITE) 1 MG tablet Take 1 tablet (1 mg total) by mouth daily.  . furosemide (LASIX) 40 MG tablet Take 1 tablet (40 mg total) by mouth 2 (two) times daily.  Marland Kitchen ibuprofen (ADVIL,MOTRIN) 800 MG tablet Take 1 tablet (800 mg total) by mouth 3 (three) times daily as needed.  . insulin lispro (HUMALOG) 100 UNIT/ML injection As directed via Insulin pump  . losartan (COZAAR) 50 MG tablet Take 1 tablet (50 mg total) by mouth daily.  . methotrexate (RHEUMATREX) 2.5 MG tablet Take 4 tablets (10 mg total) by mouth once a week. Takes every Wednesday.Caution:Chemotherapy. Protect from light.  . metoprolol tartrate (LOPRESSOR) 25 MG tablet Take 1 tablet (25 mg total) by mouth 2 (two) times daily as needed.  . Multiple Vitamin (MULTIVITAMIN) tablet Take 1 tablet by mouth daily.    . pantoprazole (PROTONIX) 40 MG tablet Take one tablet by mouth twice daily.  Marland Kitchen saccharomyces boulardii (FLORASTOR) 250 MG capsule Take 250 mg by mouth as needed.  . zoledronic acid (  RECLAST) 5 MG/100ML SOLN Inject 5 mg into the vein once.    Review of Systems  Constitutional: Negative.   HENT: Negative.   Eyes: Negative.   Respiratory: Negative.   Cardiovascular: Negative.   Gastrointestinal: Negative.   Endocrine: Negative.   Musculoskeletal: Positive for arthralgias.   Skin: Negative.   Allergic/Immunologic: Negative.   Neurological: Negative.   Hematological: Negative.   Psychiatric/Behavioral: Negative.   All other systems reviewed and are negative.   BP 178/70  Pulse 67  Ht 5\' 4"  (1.626 m)  Wt 167 lb 8 oz (75.978 kg)  BMI 28.74 kg/m2  Physical Exam  Nursing note and vitals reviewed. Constitutional: She is oriented to person, place, and time. She appears well-developed and well-nourished.  HENT:  Head: Normocephalic.  Nose: Nose normal.  Mouth/Throat: Oropharynx is clear and moist.  Eyes: Conjunctivae are normal. Pupils are equal, round, and reactive to light.  Neck: Normal range of motion. Neck supple. No JVD present.  Cardiovascular: Normal rate, regular rhythm, S1 normal, S2 normal, normal heart sounds and intact distal pulses.  Exam reveals no gallop and no friction rub.   No murmur heard. Well healing mediastinal incision  Pulmonary/Chest: Effort normal and breath sounds normal. No respiratory distress. She has no wheezes. She has no rales. She exhibits no tenderness.  Scant Rales at the bases bilaterally  Abdominal: Soft. Bowel sounds are normal. She exhibits no distension. There is no tenderness.  Musculoskeletal: Normal range of motion. She exhibits no edema and no tenderness.  Ecchymosis of the left upper thigh, medial aspect. Trace to mild edema of the left lower extremity.  Lymphadenopathy:    She has no cervical adenopathy.  Neurological: She is alert and oriented to person, place, and time. Coordination normal.  Skin: Skin is warm and dry. No rash noted. No erythema.  Psychiatric: She has a normal mood and affect. Her behavior is normal. Judgment and thought content normal.    Assessment and Plan

## 2013-08-15 NOTE — Patient Instructions (Signed)
You are doing well. Please increase the losartan up to 100 mg daily Monitor your blood pressure  If it runs high (>140), call the office  Please call us if you have new issues that need to be addressed before your next appt.  Your physician wants you to follow-up in: 6 months.  You will receive a reminder letter in the mail two months in advance. If you don't receive a letter, please call our office to schedule the follow-up appointment.

## 2013-08-15 NOTE — Assessment & Plan Note (Signed)
We will increase her losartan up to 100 mg daily. If blood pressure continues to run high, could restart amlodipine 5 mg daily. She was previously on 10 mg daily but now reports having mild leg edema from Enbrel. Other options for blood pressure medications include clonidine or Cardura

## 2013-08-15 NOTE — Assessment & Plan Note (Signed)
Prior cholesterol above goal. She reports that she will see primary care next month with lab work at that time. Goal LDL less than 70

## 2013-08-21 LAB — HEMOGLOBIN A1C: HEMOGLOBIN A1C: 7.2 % — AB (ref 4.0–6.0)

## 2013-09-17 ENCOUNTER — Telehealth: Payer: Self-pay | Admitting: Internal Medicine

## 2013-09-17 DIAGNOSIS — E109 Type 1 diabetes mellitus without complications: Secondary | ICD-10-CM

## 2013-09-17 MED ORDER — INSULIN LISPRO 100 UNIT/ML ~~LOC~~ SOLN: SUBCUTANEOUS | Status: DC

## 2013-09-17 NOTE — Telephone Encounter (Signed)
Dr. Gabriel Carina manages insulin dosing on her pump, but if she needs refill on Humalog vial for some reason, then we can refill.

## 2013-09-17 NOTE — Telephone Encounter (Signed)
Are you managing pt's insulin pump or does request need to go to Dr. Gabriel Carina?

## 2013-09-17 NOTE — Telephone Encounter (Signed)
REFILL REQUEST FROM [facility] FOR   HUMALOG 100U/ML INJ. #30  USE AS DIRECTED

## 2013-09-20 ENCOUNTER — Encounter: Payer: Self-pay | Admitting: Internal Medicine

## 2013-09-20 ENCOUNTER — Ambulatory Visit (INDEPENDENT_AMBULATORY_CARE_PROVIDER_SITE_OTHER): Payer: Commercial Managed Care - HMO | Admitting: Internal Medicine

## 2013-09-20 ENCOUNTER — Encounter: Payer: Medicare Other | Admitting: Internal Medicine

## 2013-09-20 VITALS — BP 180/64 | HR 70 | Temp 98.1°F | Ht 63.5 in | Wt 163.0 lb

## 2013-09-20 DIAGNOSIS — E109 Type 1 diabetes mellitus without complications: Secondary | ICD-10-CM

## 2013-09-20 DIAGNOSIS — Z1239 Encounter for other screening for malignant neoplasm of breast: Secondary | ICD-10-CM

## 2013-09-20 DIAGNOSIS — Z23 Encounter for immunization: Secondary | ICD-10-CM

## 2013-09-20 DIAGNOSIS — Z780 Asymptomatic menopausal state: Secondary | ICD-10-CM

## 2013-09-20 DIAGNOSIS — Z0000 Encounter for general adult medical examination without abnormal findings: Secondary | ICD-10-CM

## 2013-09-20 DIAGNOSIS — I25810 Atherosclerosis of coronary artery bypass graft(s) without angina pectoris: Secondary | ICD-10-CM

## 2013-09-20 DIAGNOSIS — I10 Essential (primary) hypertension: Secondary | ICD-10-CM

## 2013-09-20 NOTE — Progress Notes (Signed)
Pre visit review using our clinic review tool, if applicable. No additional management support is needed unless otherwise documented below in the visit note. 

## 2013-09-20 NOTE — Progress Notes (Deleted)
   Subjective:    Patient ID: [name], female    DOB: 1946-02-12, 68 y.o.   MRN: [id number]  HPI 68YO female presents for follow up.  DM -   CAD -   Review of Systems     Objective:    BP 170/70  Pulse 70  Temp(Src) 98.1 F (36.7 C) (Oral)  Ht 5' 3.5" (1.613 m)  Wt 163 lb (73.936 kg)  BMI 28.42 kg/m2  SpO2 98% Physical Exam        Assessment & Plan:   Problem List Items Addressed This Visit     Unprioritized   CAD (coronary artery disease) of artery bypass graft   Diabetes type 1, controlled - Primary       No Follow-up on file.

## 2013-09-20 NOTE — Assessment & Plan Note (Signed)
Will check A1c with labs today. [facility] referral placed, as pt has been unable to get diabetic supplies and insulin through [facility].

## 2013-09-20 NOTE — Assessment & Plan Note (Signed)
Symptomatically doing well. Continue statin, aspirin, BB, ACEi.

## 2013-09-20 NOTE — Progress Notes (Signed)
The patient is here for annual Medicare Wellness Examination and management of other chronic and acute problems.   The risk factors are reflected in the history.  The roster of all physicians providing medical care to patient - is listed in the Snapshot section of the chart.  Activities of daily living:   The patient is 100% independent in all ADLs: dressing, toileting, feeding as well as independent mobility. Patient lives with husband in one story home. Has hard wood floors. No pets.  Home safety :  The patient has smoke detectors in the home.  They wear seatbelts in their car. There are no firearms at home.  There is no violence in the home. They feel safe where they live.  Infectious Risks: There is no risks for hepatitis, STDs or HIV.  There is no  history of blood transfusion.  Pt unsure if blood given during CABG. They have no travel history to infectious disease endemic areas of the world.  Additional Health Care Providers: The patient has seen their dentist in the last six months. Dentist - Dr. Quillian Quince They have seen their eye doctor in the last year. Opthalmologist - Dr. Gloriann Loan They deny hearing issues. They have deferred audiologic testing in the last year.   They do not  have excessive sun exposure. Discussed the need for sun protection: hats,long sleeves and use of sunscreen if there is significant sun exposure.  Dermatologist - none Cardiologist - Dr. Rockey Situ Rheumatologist - Dr. Jefm Bryant Endocrinologist - Dr. Gabriel Carina  Diet: the importance of a healthy diet is discussed. They do have a healthy diet.  The benefits of regular aerobic exercise were discussed. Patient exercises by at gym using treadmill and bike. Some weight training.  Depression screen: there are no signs or vegative symptoms of depression- irritability, change in appetite, anhedonia, sadness/tearfullness.  Cognitive assessment: the patient manages all their financial and personal affairs and is actively  engaged. They could relate day,date,year and events.  HCPOA - husband, Suriah Peragine  The following portions of the patient's history were reviewed and updated as appropriate: allergies, current medications, past family history, past medical history,  past surgical history, past social history and problem list.  Visual acuity was not assessed per patient preference as they have regular follow up with their ophthalmologist. Hearing and body mass index were assessed and reviewed.   During the course of the visit the patient was educated and counseled about appropriate screening and preventive services including : fall prevention , diabetes screening, nutrition counseling, colorectal cancer screening, and recommended immunizations.    HTN - BP at home 130-140s. Increased her Losartan.  BG - Having issues with getting insurance coverage for insulin pump. Last A1c 7.2%.  Review of Systems  Constitutional: Negative for fever, chills, appetite change, fatigue and unexpected weight change.  HENT: Negative for congestion, ear pain, sinus pressure, sore throat, trouble swallowing and voice change.   Eyes: Negative for visual disturbance.  Respiratory: Negative for cough, shortness of breath, wheezing and stridor.   Cardiovascular: Negative for chest pain, palpitations and leg swelling.  Gastrointestinal: Negative for nausea, vomiting, abdominal pain, diarrhea, constipation, blood in stool, abdominal distention and anal bleeding.  Genitourinary: Negative for dysuria and flank pain.  Musculoskeletal: Negative for arthralgias, gait problem, myalgias and neck pain.  Skin: Negative for color change and rash.  Neurological: Negative for dizziness and headaches.  Hematological: Negative for adenopathy. Does not bruise/bleed easily.  Psychiatric/Behavioral: Negative for suicidal ideas, sleep disturbance and dysphoric mood. The  patient is not nervous/anxious.        Objective:    BP 180/64  Pulse  70  Temp(Src) 98.1 F (36.7 C) (Oral)  Ht 5' 3.5" (1.613 m)  Wt 163 lb (73.936 kg)  BMI 28.42 kg/m2  SpO2 98% Physical Exam  Constitutional: She is oriented to person, place, and time. She appears well-developed and well-nourished. No distress.  HENT:  Head: Normocephalic and atraumatic.  Right Ear: External ear normal.  Left Ear: External ear normal.  Nose: Nose normal.  Mouth/Throat: Oropharynx is clear and moist. No oropharyngeal exudate.  Eyes: Conjunctivae are normal. Pupils are equal, round, and reactive to light. Right eye exhibits no discharge. Left eye exhibits no discharge. No scleral icterus.  Neck: Normal range of motion. Neck supple. No tracheal deviation present. No thyromegaly present.  Cardiovascular: Normal rate, regular rhythm, normal heart sounds and intact distal pulses.  Exam reveals no gallop and no friction rub.   No murmur heard. Pulmonary/Chest: Effort normal and breath sounds normal. No accessory muscle usage. Not tachypneic. No respiratory distress. She has no decreased breath sounds. She has no wheezes. She has no rales. She exhibits no tenderness. Right breast exhibits no inverted nipple, no mass, no nipple discharge, no skin change and no tenderness. Left breast exhibits no inverted nipple, no mass, no nipple discharge, no skin change and no tenderness. Breasts are symmetrical.    Abdominal: Soft. Bowel sounds are normal. She exhibits no distension and no mass. There is no tenderness. There is no rebound and no guarding.  Musculoskeletal: Normal range of motion. She exhibits no edema and no tenderness.  Lymphadenopathy:    She has no cervical adenopathy.  Neurological: She is alert and oriented to person, place, and time. No cranial nerve deficit. She exhibits normal muscle tone. Coordination normal.  Skin: Skin is warm and dry. No rash noted. She is not diaphoretic. No erythema. No pallor.  Psychiatric: She has a normal mood and affect. Her behavior is  normal. Judgment and thought content normal.          Assessment & Plan:   Problem List Items Addressed This Visit     Unprioritized   CAD (coronary artery disease) of artery bypass graft     Symptomatically doing well. Continue statin, aspirin, BB, ACEi.    Diabetes type 1, controlled     Will check A1c with labs today. [facility] referral placed, as pt has been unable to get diabetic supplies and insulin through [facility].    Relevant Orders      Comprehensive metabolic panel      Hemoglobin A1c      Lipid panel      Microalbumin / creatinine urine ratio      Ambulatory referral to Podiatry      Ambulatory referral to Home Health   Hypertension     BP elevated today, however per pt report has been much better controlled at home after increasing Losartan to 100mg . Will have her monitor at home and email with readings. Discussed starting back on Amlodipine 5mg  daily if persistently elevated. Follow up recheck in 4 weeks.    Medicare annual wellness visit, subsequent - Primary     General medical exam including breast exam normal today. PAP and pelvic deferred given pt age and preference. Colonoscopy UTD. Mammogram and bone density testing ordered. Labs including CMP, lipids, A1c ordered. Prevnar given today. Encouraged healthy diet and exercise.     Other Visit Diagnoses   Screening for  breast cancer        Relevant Orders       MM Digital Screening    Postmenopausal estrogen deficiency        Relevant Orders       DG Bone Density    Need for prophylactic vaccination against Streptococcus pneumoniae (pneumococcus)        Relevant Orders       Pneumococcal conjugate vaccine 13-valent (Completed)        Return in about 4 weeks (around 10/18/2013) for Recheck of Diabetes.

## 2013-09-20 NOTE — Patient Instructions (Signed)
Monitor blood pressure 2-3 times per week at home and email or call with readings.  Follow up in 4 weeks to recheck blood pressure.

## 2013-09-20 NOTE — Assessment & Plan Note (Signed)
BP elevated today, however per pt report has been much better controlled at home after increasing Losartan to 100mg . Will have her monitor at home and email with readings. Discussed starting back on Amlodipine 5mg  daily if persistently elevated. Follow up recheck in 4 weeks.

## 2013-09-20 NOTE — Assessment & Plan Note (Signed)
General medical exam including breast exam normal today. PAP and pelvic deferred given pt age and preference. Colonoscopy UTD. Mammogram and bone density testing ordered. Labs including CMP, lipids, A1c ordered. Prevnar given today. Encouraged healthy diet and exercise.

## 2013-09-24 ENCOUNTER — Telehealth: Payer: Self-pay | Admitting: Internal Medicine

## 2013-09-24 NOTE — Telephone Encounter (Signed)
Relevant patient education assigned to patient using Emmi. ° °

## 2013-10-16 ENCOUNTER — Ambulatory Visit (INDEPENDENT_AMBULATORY_CARE_PROVIDER_SITE_OTHER): Payer: Commercial Managed Care - HMO | Admitting: Podiatry

## 2013-10-16 ENCOUNTER — Ambulatory Visit (INDEPENDENT_AMBULATORY_CARE_PROVIDER_SITE_OTHER): Payer: Commercial Managed Care - HMO

## 2013-10-16 ENCOUNTER — Encounter: Payer: Self-pay | Admitting: Podiatry

## 2013-10-16 VITALS — BP 134/59 | HR 75 | Resp 16 | Ht 63.0 in | Wt 160.0 lb

## 2013-10-16 DIAGNOSIS — M79609 Pain in unspecified limb: Secondary | ICD-10-CM

## 2013-10-16 DIAGNOSIS — B351 Tinea unguium: Secondary | ICD-10-CM | POA: Diagnosis not present

## 2013-10-16 DIAGNOSIS — E119 Type 2 diabetes mellitus without complications: Secondary | ICD-10-CM

## 2013-10-16 NOTE — Progress Notes (Signed)
   Subjective:    Patient ID: [name], female    DOB: [birth year], 68 y.o.   MRN: [id number]  HPI Comments: i need my toenails trimmed. Every now and then my two big toenails will hurt. My two little toes go in and they do not hurt. i have had this for a couple of years. They have gotten worse. i have used stuff over the counter and nothing works. My husband has been taking care of my toenails and i had a pedicure one month ago.     Review of Systems  All other systems reviewed and are negative.      Objective:   Physical Exam: I have reviewed her past medical history medications allergies surgeries social history and review of systems. Pulses are palpable bilateral. Neurologic sensorium is intact per since once the monofilament. Deep reflexes are intact bilateral. Muscle strength is 5 over 5 dorsiflexors plantar flexors inverters everters all intrinsic musculature is intact. Orthopedic evaluation Mr. is all joints distal to the ankle a full range of motion without crepitation. Mild hammertoe deformities and HAV deformities noted bilateral but asymptomatic. Cutaneous evaluation demonstrates thick yellow dystrophic with mycotic nails which painful palpation as well as debridement.        Assessment & Plan:  Assessment: Diabetes with thick dystrophic nails hallux bilateral and pain in limb secondary to onychomycosis 1 through 5 bilateral.  Plan: Debridement of nails thickness and length as cover service secondary to pain.

## 2013-10-21 LAB — HEMOGLOBIN A1C: Hgb A1c MFr Bld: 7.3 % — AB (ref 4.0–6.0)

## 2013-11-06 LAB — HM DIABETES FOOT EXAM

## 2013-11-07 ENCOUNTER — Encounter: Payer: Self-pay | Admitting: Internal Medicine

## 2013-11-07 ENCOUNTER — Ambulatory Visit (INDEPENDENT_AMBULATORY_CARE_PROVIDER_SITE_OTHER): Payer: Commercial Managed Care - HMO | Admitting: Internal Medicine

## 2013-11-07 VITALS — BP 140/62 | HR 79 | Temp 98.6°F | Ht 63.5 in | Wt 161.8 lb

## 2013-11-07 DIAGNOSIS — I10 Essential (primary) hypertension: Secondary | ICD-10-CM

## 2013-11-07 DIAGNOSIS — E785 Hyperlipidemia, unspecified: Secondary | ICD-10-CM

## 2013-11-07 DIAGNOSIS — E109 Type 1 diabetes mellitus without complications: Secondary | ICD-10-CM

## 2013-11-07 DIAGNOSIS — I2510 Atherosclerotic heart disease of native coronary artery without angina pectoris: Secondary | ICD-10-CM

## 2013-11-07 DIAGNOSIS — I2583 Coronary atherosclerosis due to lipid rich plaque: Secondary | ICD-10-CM

## 2013-11-07 NOTE — Progress Notes (Signed)
   Subjective:    Patient ID: [name], female    DOB: Nov 11, 1945, 68 y.o.   MRN: [id number]  HPI 68YO female presents for follow up.  HTN - BP running 130s-140s/50s. Compliant with meds.  DM - Seen by endocrine yesterday. Having a few low BG in the 30s in the mornings. Basal insulin dose on pump decreased by endocrinology. Starting BG sensor.  No recent chest pain, however notes some tingling at incision site. No dyspnea.   Review of Systems  Constitutional: Negative for fever, chills, appetite change, fatigue and unexpected weight change.  Eyes: Negative for visual disturbance.  Respiratory: Negative for shortness of breath.   Cardiovascular: Negative for chest pain and leg swelling.  Gastrointestinal: Negative for abdominal pain.  Skin: Negative for color change and rash.  Hematological: Negative for adenopathy. Does not bruise/bleed easily.  Psychiatric/Behavioral: Negative for dysphoric mood. The patient is not nervous/anxious.        Objective:    BP 140/62  Pulse 79  Temp(Src) 98.6 F (37 C) (Oral)  Ht 5' 3.5" (1.613 m)  Wt 161 lb 12 oz (73.369 kg)  BMI 28.20 kg/m2  SpO2 94% Physical Exam  Constitutional: She is oriented to person, place, and time. She appears well-developed and well-nourished. No distress.  HENT:  Head: Normocephalic and atraumatic.  Right Ear: External ear normal.  Left Ear: External ear normal.  Nose: Nose normal.  Mouth/Throat: Oropharynx is clear and moist. No oropharyngeal exudate.  Eyes: Conjunctivae are normal. Pupils are equal, round, and reactive to light. Right eye exhibits no discharge. Left eye exhibits no discharge. No scleral icterus.  Neck: Normal range of motion. Neck supple. No tracheal deviation present. No thyromegaly present.  Cardiovascular: Normal rate, regular rhythm, normal heart sounds and intact distal pulses.  Exam reveals no gallop and no friction rub.   No murmur heard. Pulmonary/Chest: Effort normal and  breath sounds normal. No accessory muscle usage. Not tachypneic. No respiratory distress. She has no decreased breath sounds. She has no wheezes. She has no rhonchi. She has no rales. She exhibits no tenderness.  Musculoskeletal: Normal range of motion. She exhibits no edema and no tenderness.  Lymphadenopathy:    She has no cervical adenopathy.  Neurological: She is alert and oriented to person, place, and time. No cranial nerve deficit. She exhibits normal muscle tone. Coordination normal.  Skin: Skin is warm and dry. No rash noted. She is not diaphoretic. No erythema. No pallor.  Psychiatric: She has a normal mood and affect. Her behavior is normal. Judgment and thought content normal.          Assessment & Plan:   Problem List Items Addressed This Visit     Unprioritized   CAD (coronary artery disease)     Symptomatically doing well. Continue current medications.    Diabetes type 1, controlled      Lab Results  Component Value Date   HGBA1C 7.3* 10/21/2013   BG well controlled, but having some lows, so basal dosing on pump reduced by endocrinologist. Will continue to monitor.    Hyperlipidemia   Hypertension - Primary      BP Readings from Last 3 Encounters:  11/07/13 140/62  10/16/13 134/59  09/20/13 180/64   BP well controlled on current medication. Will continue.        Return in about 6 months (around 05/10/2014) for Recheck of Diabetes.

## 2013-11-07 NOTE — Assessment & Plan Note (Signed)
BP Readings from Last 3 Encounters:  11/07/13 140/62  10/16/13 134/59  09/20/13 180/64   BP well controlled on current medication. Will continue.

## 2013-11-07 NOTE — Assessment & Plan Note (Signed)
Lab Results  Component Value Date   HGBA1C 7.3* 10/21/2013   BG well controlled, but having some lows, so basal dosing on pump reduced by endocrinologist. Will continue to monitor.

## 2013-11-07 NOTE — Progress Notes (Signed)
Pre visit review using our clinic review tool, if applicable. No additional management support is needed unless otherwise documented below in the visit note. 

## 2013-11-07 NOTE — Assessment & Plan Note (Signed)
Symptomatically doing well. Continue current medications. 

## 2013-11-19 ENCOUNTER — Other Ambulatory Visit: Payer: Self-pay | Admitting: *Deleted

## 2013-11-19 DIAGNOSIS — E109 Type 1 diabetes mellitus without complications: Secondary | ICD-10-CM

## 2013-11-19 MED ORDER — INSULIN LISPRO 100 UNIT/ML ~~LOC~~ SOLN: SUBCUTANEOUS | Status: DC

## 2013-11-20 ENCOUNTER — Other Ambulatory Visit: Payer: Self-pay | Admitting: *Deleted

## 2013-11-20 DIAGNOSIS — E109 Type 1 diabetes mellitus without complications: Secondary | ICD-10-CM

## 2013-11-20 MED ORDER — INSULIN LISPRO 100 UNIT/ML ~~LOC~~ SOLN: SUBCUTANEOUS | Status: DC

## 2013-11-28 ENCOUNTER — Encounter: Payer: Self-pay | Admitting: *Deleted

## 2013-11-28 ENCOUNTER — Other Ambulatory Visit: Payer: Self-pay | Admitting: *Deleted

## 2013-11-28 DIAGNOSIS — E109 Type 1 diabetes mellitus without complications: Secondary | ICD-10-CM

## 2013-12-09 ENCOUNTER — Other Ambulatory Visit (INDEPENDENT_AMBULATORY_CARE_PROVIDER_SITE_OTHER): Payer: Commercial Managed Care - HMO

## 2013-12-09 DIAGNOSIS — E109 Type 1 diabetes mellitus without complications: Secondary | ICD-10-CM

## 2013-12-09 DIAGNOSIS — E785 Hyperlipidemia, unspecified: Secondary | ICD-10-CM

## 2013-12-09 LAB — LIPID PANEL
CHOL/HDL RATIO: 3
Cholesterol: 187 mg/dL (ref 0–200)
HDL: 70 mg/dL (ref >39.00)
LDL Cholesterol: 104 mg/dL — ABNORMAL HIGH (ref 0–99)
NonHDL: 117
Triglycerides: 63 mg/dL (ref 0.0–149.0)
VLDL: 12.6 mg/dL (ref 0.0–40.0)

## 2013-12-09 LAB — COMPREHENSIVE METABOLIC PANEL
ALBUMIN: 3.4 g/dL — AB (ref 3.5–5.2)
ALT: 17 U/L (ref 0–35)
AST: 22 U/L (ref 0–37)
Alkaline Phosphatase: 40 U/L (ref 39–117)
BUN: 14 mg/dL (ref 6–23)
CHLORIDE: 103 meq/L (ref 96–112)
CO2: 26 mEq/L (ref 19–32)
Calcium: 8.9 mg/dL (ref 8.4–10.5)
Creatinine, Ser: 0.6 mg/dL (ref 0.4–1.2)
GFR: 101.15 mL/min (ref >60.00)
Glucose, Bld: 171 mg/dL — ABNORMAL HIGH (ref 70–99)
POTASSIUM: 3.8 meq/L (ref 3.5–5.1)
SODIUM: 137 meq/L (ref 135–145)
TOTAL PROTEIN: 7.1 g/dL (ref 6.0–8.3)
Total Bilirubin: 0.6 mg/dL (ref 0.2–1.2)

## 2013-12-09 LAB — HEMOGLOBIN A1C: Hgb A1c MFr Bld: 7.8 % — ABNORMAL HIGH (ref 4.6–6.5)

## 2013-12-09 LAB — MICROALBUMIN / CREATININE URINE RATIO
CREATININE, U: 195.9 mg/dL
MICROALB/CREAT RATIO: 0.8 mg/g (ref 0.0–30.0)
Microalb, Ur: 1.6 mg/dL (ref 0.0–1.9)

## 2013-12-10 ENCOUNTER — Ambulatory Visit: Payer: Self-pay | Admitting: Internal Medicine

## 2013-12-10 LAB — HM DEXA SCAN

## 2013-12-10 LAB — HM MAMMOGRAPHY: HM Mammogram: NEGATIVE

## 2013-12-11 ENCOUNTER — Telehealth: Payer: Self-pay | Admitting: Internal Medicine

## 2013-12-11 ENCOUNTER — Encounter: Payer: Self-pay | Admitting: *Deleted

## 2013-12-11 NOTE — Telephone Encounter (Signed)
Appt scheduled

## 2013-12-11 NOTE — Telephone Encounter (Signed)
Notified pt, Transferred to front desk for appt to be scheduled

## 2013-12-11 NOTE — Telephone Encounter (Signed)
Bone density testing was abnormal showing T-score of -2.6 which indicates osteoporosis. Please make pt a follow up visit to discuss this or we can discuss at her visit for diabetes follow up.

## 2013-12-23 ENCOUNTER — Telehealth: Payer: Self-pay

## 2013-12-23 ENCOUNTER — Encounter: Payer: Self-pay | Admitting: Internal Medicine

## 2013-12-23 DIAGNOSIS — E785 Hyperlipidemia, unspecified: Secondary | ICD-10-CM

## 2013-12-23 MED ORDER — FUROSEMIDE 40 MG PO TABS: 40.0000 mg | ORAL_TABLET | Freq: Two times a day (BID) | ORAL | Status: DC

## 2013-12-23 MED ORDER — FOLIC ACID 1 MG PO TABS: 1.0000 mg | ORAL_TABLET | Freq: Every day | ORAL | Status: DC

## 2013-12-23 MED ORDER — ATORVASTATIN CALCIUM 20 MG PO TABS: 20.0000 mg | ORAL_TABLET | Freq: Every day | ORAL | Status: DC

## 2013-12-23 NOTE — Telephone Encounter (Signed)
The patient called and stated she would be having cataract surgery with Dr.Timothy Bevis.  The [employer] referral has been placed.  Ref # - [id number] (authorized for 6 visits)

## 2013-12-27 ENCOUNTER — Encounter: Payer: Self-pay | Admitting: Internal Medicine

## 2014-01-01 ENCOUNTER — Other Ambulatory Visit: Payer: Self-pay

## 2014-01-01 MED ORDER — LOSARTAN POTASSIUM 100 MG PO TABS
100.0000 mg | ORAL_TABLET | Freq: Every day | ORAL | Status: DC
Start: 2014-01-01 — End: 2014-05-15

## 2014-01-01 NOTE — Telephone Encounter (Signed)
Refill sent for losartan 100 mg

## 2014-01-13 ENCOUNTER — Ambulatory Visit (INDEPENDENT_AMBULATORY_CARE_PROVIDER_SITE_OTHER): Payer: Commercial Managed Care - HMO | Admitting: Internal Medicine

## 2014-01-13 ENCOUNTER — Encounter: Payer: Self-pay | Admitting: Internal Medicine

## 2014-01-13 VITALS — BP 150/60 | HR 70 | Temp 98.2°F | Ht 63.5 in | Wt 166.8 lb

## 2014-01-13 DIAGNOSIS — H269 Unspecified cataract: Secondary | ICD-10-CM | POA: Insufficient documentation

## 2014-01-13 DIAGNOSIS — M810 Age-related osteoporosis without current pathological fracture: Secondary | ICD-10-CM

## 2014-01-13 NOTE — Progress Notes (Signed)
Pre visit review using our clinic review tool, if applicable. No additional management support is needed unless otherwise documented below in the visit note. 

## 2014-01-13 NOTE — Progress Notes (Signed)
   Subjective:    Patient ID: [name], female    DOB: [birth year], 68 y.o.   MRN: [id number]  HPI 68YO female presents for follow up.  Recent bone density testing from 8/11 showed T-score -2.6 Taking Vit D 5000units daily. Last dose of Reclast was in 06/2013, with Dr. Jefm Bryant. Has been taking this for 3 years.  Also concerned about bilateral cataracts. Needs referral for opthalmologist in [place]. Notes some decreased visual acuity with progression of cataracts.   Review of Systems  Constitutional: Negative for fever, chills, appetite change, fatigue and unexpected weight change.  Eyes: Positive for visual disturbance.  Respiratory: Negative for shortness of breath.   Cardiovascular: Negative for chest pain and leg swelling.  Gastrointestinal: Negative for nausea, vomiting, abdominal pain, diarrhea and constipation.  Musculoskeletal: Negative for arthralgias and myalgias.  Skin: Negative for color change and rash.  Hematological: Negative for adenopathy. Does not bruise/bleed easily.  Psychiatric/Behavioral: Negative for dysphoric mood. The patient is not nervous/anxious.        Objective:    BP 150/60  Pulse 70  Temp(Src) 98.2 F (36.8 C) (Oral)  Ht 5' 3.5" (1.613 m)  Wt 166 lb 12 oz (75.637 kg)  BMI 29.07 kg/m2  SpO2 95% Physical Exam  Constitutional: She is oriented to person, place, and time. She appears well-developed and well-nourished. No distress.  HENT:  Head: Normocephalic and atraumatic.  Right Ear: External ear normal.  Left Ear: External ear normal.  Nose: Nose normal.  Mouth/Throat: Oropharynx is clear and moist.  Eyes: Conjunctivae are normal. Pupils are equal, round, and reactive to light. Right eye exhibits no discharge. Left eye exhibits no discharge. No scleral icterus.  Neck: Normal range of motion. Neck supple. No tracheal deviation present. No thyromegaly present.  Cardiovascular: Normal rate, regular rhythm, normal heart sounds and intact  distal pulses.  Exam reveals no gallop and no friction rub.   No murmur heard. Pulmonary/Chest: Effort normal and breath sounds normal. No accessory muscle usage. Not tachypneic. No respiratory distress. She has no decreased breath sounds. She has no wheezes. She has no rhonchi. She has no rales. She exhibits no tenderness.  Musculoskeletal: Normal range of motion. She exhibits no edema and no tenderness.  Lymphadenopathy:    She has no cervical adenopathy.  Neurological: She is alert and oriented to person, place, and time. No cranial nerve deficit. She exhibits normal muscle tone. Coordination normal.  Skin: Skin is warm and dry. No rash noted. She is not diaphoretic. No erythema. No pallor.  Psychiatric: She has a normal mood and affect. Her behavior is normal. Judgment and thought content normal.          Assessment & Plan:   Problem List Items Addressed This Visit     Unprioritized   Cataracts, bilateral     Referral placed for opthalmology evaluation with Dr. Talbert Forest.    Osteoporosis, unspecified - Primary     Reviewed recent bone density testing with patient and compared to testing from 2012. Will plan to continue Reclast and supplemental vit d. She will have Vit D level drawn with next labs. Encouraged adequate dietary calcium and regular exercise.        Return in about 6 months (around 07/14/2014) for Recheck.

## 2014-01-13 NOTE — Assessment & Plan Note (Signed)
Reviewed recent bone density testing with patient and compared to testing from 2012. Will plan to continue Reclast and supplemental vit d. She will have Vit D level drawn with next labs. Encouraged adequate dietary calcium and regular exercise.

## 2014-01-13 NOTE — Patient Instructions (Signed)
Continue Reclast.  Recheck Bone Density in 2017.  Please ask Dr. Gabriel Carina to check Vit D with next labs.

## 2014-01-13 NOTE — Assessment & Plan Note (Signed)
Referral placed for opthalmology evaluation with Dr. Talbert Forest.

## 2014-01-15 ENCOUNTER — Ambulatory Visit (INDEPENDENT_AMBULATORY_CARE_PROVIDER_SITE_OTHER): Payer: Commercial Managed Care - HMO | Admitting: Podiatry

## 2014-01-15 VITALS — BP 141/62 | HR 74 | Resp 16

## 2014-01-15 DIAGNOSIS — B351 Tinea unguium: Secondary | ICD-10-CM

## 2014-01-15 DIAGNOSIS — M79609 Pain in unspecified limb: Secondary | ICD-10-CM

## 2014-01-15 DIAGNOSIS — M79676 Pain in unspecified toe(s): Secondary | ICD-10-CM

## 2014-01-15 NOTE — Progress Notes (Signed)
She presents today with a chief complaint of painful elongated toenails one through 5 bilateral. ° °Objective: Nails are thick yellow dystrophic with mycotic and painful palpation. Pulses are palpable bilateral. ° °Assessment: Pain in limb second Mrs. 1 through 5 bilateral. ° °Plan: Debridement of nails 1 through 5 bilateral covered service secondary to pain. °

## 2014-02-12 ENCOUNTER — Ambulatory Visit: Payer: Commercial Managed Care - HMO | Admitting: Cardiovascular Disease

## 2014-02-18 ENCOUNTER — Encounter: Payer: Self-pay | Admitting: Cardiovascular Disease

## 2014-02-18 ENCOUNTER — Ambulatory Visit (INDEPENDENT_AMBULATORY_CARE_PROVIDER_SITE_OTHER): Payer: Commercial Managed Care - HMO | Admitting: Cardiovascular Disease

## 2014-02-18 VITALS — BP 158/70 | HR 72 | Ht 63.0 in | Wt 166.0 lb

## 2014-02-18 DIAGNOSIS — E109 Type 1 diabetes mellitus without complications: Secondary | ICD-10-CM

## 2014-02-18 DIAGNOSIS — E785 Hyperlipidemia, unspecified: Secondary | ICD-10-CM

## 2014-02-18 DIAGNOSIS — I2510 Atherosclerotic heart disease of native coronary artery without angina pectoris: Secondary | ICD-10-CM

## 2014-02-18 DIAGNOSIS — I2583 Coronary atherosclerosis due to lipid rich plaque: Principal | ICD-10-CM

## 2014-02-18 DIAGNOSIS — I10 Essential (primary) hypertension: Secondary | ICD-10-CM

## 2014-02-18 DIAGNOSIS — I25810 Atherosclerosis of coronary artery bypass graft(s) without angina pectoris: Secondary | ICD-10-CM

## 2014-02-18 MED ORDER — ATORVASTATIN CALCIUM 40 MG PO TABS: 40.0000 mg | ORAL_TABLET | Freq: Every day | ORAL | Status: DC

## 2014-02-18 MED ORDER — CLONIDINE HCL 0.1 MG PO TABS: 0.1000 mg | ORAL_TABLET | Freq: Two times a day (BID) | ORAL | Status: DC

## 2014-02-18 NOTE — Assessment & Plan Note (Addendum)
Cholesterol is above goal. We have suggested she increase his Lipitor up to 40 mg daily She is concerned about statins and the affect on her diabetes numbers. This was discussed with her

## 2014-02-18 NOTE — Assessment & Plan Note (Signed)
Blood pressure is the morning. We will start clonidine 0.1 mg twice a day in addition to her other medications

## 2014-02-18 NOTE — Assessment & Plan Note (Signed)
Currently with no symptoms of angina. No further workup at this time. Continue current medication regimen. 

## 2014-02-18 NOTE — Assessment & Plan Note (Signed)
Recent hemoglobin A1c 7.5. Recommended she restart her walking program in an effort to lose several pounds.  diet modification

## 2014-02-18 NOTE — Progress Notes (Signed)
Patient ID: [name], female    DOB: 01-19-1946, 68 y.o.   MRN: [id number]  HPI Comments: Ms. 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 She had spiking fevers, chills, severe shortness of breath. Sugars were in the 400s. She had a long course of antibiotics to her hospital visit. Secondary to significant cough, she was unable to lie flat for cardiac catheterization. She declined cardiac catheterization on the same hospital visit. On admission to the hospital, her immunosuppressants, Enbrel and methotrexate were held  She had cardiac catheterization in followup after her pneumonia resolved. This showed severe three-vessel CAD. She was transferred from [facility] to [place] for bypass surgery. She had four-vessel bypass surgery with LIMA to the LAD, saphenous vein graft to the diagonal, vein graft to the OM, vein graft to the PDA. She reports relatively uneventful surgery. She was admitted on June 19, discharge June 24.   In followup today, she reports that she is doing very well.  She is taking Lasix daily. Blood pressure in the afternoon has been elevated at 767-209 systolic Most recent hemoglobin A1c 7.5 She denies having any shortness of breath, chest pain. Otherwise is active.  she has seen Precious Reel for her rheumatoid arthritis, on Enbrel and methotrexate.   Previous  Echocardiogram 8/24 2014 showed normal ejection fraction with no wall motion abnormality, normal right ventricular size and function, moderately elevated right ventricular systolic pressures.  Cardiac catheterization showed 100% proximally occluded RCA, critical ostial LAD disease, severe mid LAD  disease, severe mid left circumflex disease, ejection fraction estimated at 50-55% with mild hypokinesis of the inferior wall.  EKG shows normal sinus rhythm with rate 72 beats per minute , poor R-wave progression to the anterior precordial leads, nonspecific ST abnormalities in V5 and V6   Outpatient Encounter Prescriptions as of 02/18/2014  Medication Sig  . aspirin 81 MG tablet Take 81 mg by mouth daily.  Marland Kitchen atorvastatin (LIPITOR) 40 MG tablet Take 1 tablet (40 mg total) by mouth daily.  . Cholecalciferol (VITAMIN D) 2000 UNITS CAPS Take 1 capsule by mouth daily.  Marland Kitchen etanercept (ENBREL) 25 MG injection Inject 25 mg into the skin once a week.  . folic acid (FOLVITE) 1 MG tablet Take 1 tablet (1 mg total) by mouth daily.  . furosemide (LASIX) 40 MG tablet Take 1 tablet (40 mg total) by mouth 2 (two) times daily.  Marland Kitchen ibuprofen (ADVIL,MOTRIN) 800 MG tablet Take 1 tablet (800 mg total) by mouth 3 (three) times daily as needed.  . insulin lispro (HUMALOG) 100 UNIT/ML injection As directed via Insulin pump  . losartan (COZAAR) 100 MG tablet Take 1 tablet (100 mg total) by mouth daily.  . methotrexate (RHEUMATREX) 2.5 MG tablet Take 4 tablets (10 mg total) by mouth once a week. Takes every Wednesday.Caution:Chemotherapy. Protect from light.  . metoprolol tartrate (LOPRESSOR) 25 MG tablet Take 1 tablet (25 mg total) by mouth 2 (two) times daily as needed.  . Multiple Vitamin (MULTIVITAMIN) tablet Take 1 tablet by mouth daily.    . pantoprazole (PROTONIX) 40 MG tablet Take one tablet by mouth twice daily.  Marland Kitchen saccharomyces boulardii (FLORASTOR) 250 MG capsule Take 250 mg by mouth  as needed.  . zoledronic acid (RECLAST) 5 MG/100ML SOLN Inject 5 mg into the vein once.  Marland Kitchen atorvastatin (LIPITOR) 20 MG tablet Take 1 tablet (20 mg total) by mouth daily.    Review of Systems  Constitutional: Negative.   HENT: Negative.   Eyes: Negative.   Respiratory: Negative.   Cardiovascular: Negative.    Gastrointestinal: Negative.   Endocrine: Negative.   Musculoskeletal: Positive for arthralgias.  Skin: Negative.   Allergic/Immunologic: Negative.   Neurological: Negative.   Hematological: Negative.   Psychiatric/Behavioral: Negative.   All other systems reviewed and are negative.   BP 158/70  Pulse 72  Ht 5\' 3"  (1.6 m)  Wt 166 lb (75.297 kg)  BMI 29.41 kg/m2  Physical Exam  Nursing note and vitals reviewed. Constitutional: She is oriented to person, place, and time. She appears well-developed and well-nourished.  HENT:  Head: Normocephalic.  Nose: Nose normal.  Mouth/Throat: Oropharynx is clear and moist.  Eyes: Conjunctivae are normal. Pupils are equal, round, and reactive to light.  Neck: Normal range of motion. Neck supple. No JVD present.  Cardiovascular: Normal rate, regular rhythm, S1 normal, S2 normal, normal heart sounds and intact distal pulses.  Exam reveals no gallop and no friction rub.   No murmur heard. Well healing mediastinal incision  Pulmonary/Chest: Effort normal and breath sounds normal. No respiratory distress. She has no wheezes. She has no rales. She exhibits no tenderness.  Abdominal: Soft. Bowel sounds are normal. She exhibits no distension. There is no tenderness.  Musculoskeletal: Normal range of motion. She exhibits no edema and no tenderness.  Lymphadenopathy:    She has no cervical adenopathy.  Neurological: She is alert and oriented to person, place, and time. Coordination normal.  Skin: Skin is warm and dry. No rash noted. No erythema.  Psychiatric: She has a normal mood and affect. Her behavior is normal. Judgment and thought content normal.    Assessment and Plan

## 2014-02-18 NOTE — Patient Instructions (Addendum)
Your next appointment will be scheduled in our new office located at :  [place]  [address] [place], [place] [address]   You are doing well. Please increase the lipitor up to 40 mg a day  Please start clonidine one pill twice a day  Please call us if you have new issues that need to be addressed before your next appt.  Your physician wants you to follow-up in: 6 months.  You will receive a reminder letter in the mail two months in advance. If you don't receive a letter, please call our office to schedule the follow-up appointment.

## 2014-03-04 ENCOUNTER — Encounter: Payer: Self-pay | Admitting: Internal Medicine

## 2014-03-12 ENCOUNTER — Telehealth: Payer: Self-pay

## 2014-03-12 LAB — HM DIABETES EYE EXAM

## 2014-03-12 NOTE — Telephone Encounter (Signed)
Faxed cardiac clearance for pt to proceed w/ cataract extraction w/ intraocular lens implantation.   Per Christell Faith, PA, pt is medically stable to proceed w/ eye procedure.  Faxed to [facility] at [phone].

## 2014-03-14 ENCOUNTER — Other Ambulatory Visit: Payer: Self-pay | Admitting: Internal Medicine

## 2014-03-31 ENCOUNTER — Telehealth: Payer: Self-pay

## 2014-03-31 NOTE — Telephone Encounter (Signed)
Please call Dorian Pod at [facility] office for referral information  [phone]

## 2014-04-11 ENCOUNTER — Telehealth: Payer: Self-pay | Admitting: Internal Medicine

## 2014-04-11 MED ORDER — FLUCONAZOLE 100 MG PO TABS: ORAL_TABLET | ORAL | Status: DC

## 2014-04-11 NOTE — Telephone Encounter (Signed)
Ms. Trawick called saying she's having a flare up of Esophagitis. In October 2014 Dr. Hilarie Fredrickson [facility]) prescribed Diflucan for this but she said it's not helping her at this point. She's having difficulty swallowing. The pt wants to know if Dr. Gilford Rile can prescribe something else for her or if she needs to be seen. Please call the pt. Pt ph# [phone] Thank you.

## 2014-04-11 NOTE — Telephone Encounter (Signed)
She should be seen by Dr. Hilarie Fredrickson. If GI cannot get her in quickly, within the next week, please let me know.

## 2014-04-11 NOTE — Telephone Encounter (Signed)
Spoke to patient and she stated that she only needs a prescription for diflucan to clear the yeast infection in her mouth. Di Patient is no longer a patient of Dr. Rita Ohara. Per Dr. Gilford Rile ok to send in Rx for 100mg  for 5 days

## 2014-04-11 NOTE — Telephone Encounter (Signed)
Please review previous message and advise.Should patient follow up with GI for this problem?

## 2014-04-14 ENCOUNTER — Ambulatory Visit: Payer: Commercial Managed Care - HMO | Admitting: Podiatry

## 2014-04-14 ENCOUNTER — Other Ambulatory Visit: Payer: Commercial Managed Care - HMO

## 2014-04-28 ENCOUNTER — Ambulatory Visit (INDEPENDENT_AMBULATORY_CARE_PROVIDER_SITE_OTHER): Payer: Commercial Managed Care - HMO | Admitting: Podiatry

## 2014-04-28 DIAGNOSIS — M79676 Pain in unspecified toe(s): Secondary | ICD-10-CM

## 2014-04-28 DIAGNOSIS — B351 Tinea unguium: Secondary | ICD-10-CM

## 2014-04-28 NOTE — Progress Notes (Signed)
She presents today with a chief complaint of painful elongated toenails one through 5 bilateral.  Objective: Nails are thick yellow dystrophic with mycotic and painful palpation. Pulses are palpable bilateral.  Assessment: Pain in limb second Mrs. 1 through 5 bilateral.  Plan: Debridement of nails 1 through 5 bilateral covered service secondary to pain.

## 2014-05-01 ENCOUNTER — Encounter: Payer: Self-pay | Admitting: *Deleted

## 2014-05-02 DIAGNOSIS — C569 Malignant neoplasm of unspecified ovary: Secondary | ICD-10-CM

## 2014-05-02 HISTORY — DX: Malignant neoplasm of unspecified ovary: C56.9

## 2014-05-15 ENCOUNTER — Ambulatory Visit (INDEPENDENT_AMBULATORY_CARE_PROVIDER_SITE_OTHER): Payer: Medicare Other | Admitting: Internal Medicine

## 2014-05-15 ENCOUNTER — Encounter: Payer: Self-pay | Admitting: Internal Medicine

## 2014-05-15 VITALS — BP 177/69 | HR 69 | Temp 98.1°F | Ht 63.5 in | Wt 166.8 lb

## 2014-05-15 DIAGNOSIS — E785 Hyperlipidemia, unspecified: Secondary | ICD-10-CM

## 2014-05-15 DIAGNOSIS — I10 Essential (primary) hypertension: Secondary | ICD-10-CM | POA: Diagnosis not present

## 2014-05-15 DIAGNOSIS — E109 Type 1 diabetes mellitus without complications: Secondary | ICD-10-CM

## 2014-05-15 DIAGNOSIS — B3781 Candidal esophagitis: Secondary | ICD-10-CM | POA: Diagnosis not present

## 2014-05-15 DIAGNOSIS — M069 Rheumatoid arthritis, unspecified: Secondary | ICD-10-CM

## 2014-05-15 MED ORDER — LOSARTAN POTASSIUM 100 MG PO TABS: 100.0000 mg | ORAL_TABLET | Freq: Every day | ORAL | Status: DC

## 2014-05-15 MED ORDER — METOPROLOL TARTRATE 25 MG PO TABS: 25.0000 mg | ORAL_TABLET | Freq: Two times a day (BID) | ORAL | Status: DC | PRN

## 2014-05-15 MED ORDER — IBUPROFEN 800 MG PO TABS: 800.0000 mg | ORAL_TABLET | Freq: Three times a day (TID) | ORAL | Status: DC | PRN

## 2014-05-15 MED ORDER — FLUCONAZOLE 100 MG PO TABS: 100.0000 mg | ORAL_TABLET | Freq: Every day | ORAL | Status: DC

## 2014-05-15 MED ORDER — FUROSEMIDE 40 MG PO TABS: 40.0000 mg | ORAL_TABLET | Freq: Two times a day (BID) | ORAL | Status: DC

## 2014-05-15 MED ORDER — METHOTREXATE 2.5 MG PO TABS: 10.0000 mg | ORAL_TABLET | ORAL | Status: DC

## 2014-05-15 MED ORDER — FOLIC ACID 1 MG PO TABS: 1.0000 mg | ORAL_TABLET | Freq: Every day | ORAL | Status: DC

## 2014-05-15 MED ORDER — ATORVASTATIN CALCIUM 40 MG PO TABS: 40.0000 mg | ORAL_TABLET | Freq: Every day | ORAL | Status: DC

## 2014-05-15 MED ORDER — PANTOPRAZOLE SODIUM 40 MG PO TBEC: 40.0000 mg | DELAYED_RELEASE_TABLET | Freq: Two times a day (BID) | ORAL | Status: DC

## 2014-05-15 NOTE — Assessment & Plan Note (Signed)
Pt reports recurrent symptoms of dysphagia, which were caused by candidal esophagitis in the past. Will start course of Diflucan. If no improvement, then we discussed referral back to Dr. Hilarie Fredrickson for possible repeat endoscopy.

## 2014-05-15 NOTE — Assessment & Plan Note (Signed)
BP Readings from Last 3 Encounters:  05/15/14 177/69  02/18/14 158/70  01/15/14 141/62   BP elevated. Dr. Rockey Situ recently added Clonidine. Pt reports feeling lightheaded on medication with labile BP. Discussed potentially starting back on Amlodipine. Will check with Dr. Rockey Situ first then contact pt.

## 2014-05-15 NOTE — Assessment & Plan Note (Signed)
Will check lipids and lfts with labs today.

## 2014-05-15 NOTE — Progress Notes (Signed)
Subjective:    Patient ID: [name], female    DOB: 1945/09/02, 69 y.o.   MRN: [id number]  HPI  68YO female presents for follow up.  DM - BG have been well controlled. Followed by Dr. Gabriel Carina. No low BG below 70. Occasional elevated BG above 200.  HTN - BP running 970-263Z systolic at home, esp in afternoon. No chest pain, headache. Feels "swimmyheaded" at times. Started on Clonidine 0.1mg  twice daily.  Esophagitis - Notes that food is getting caught in mid chest with swallowing. Previously had these symptoms and was treated by Dr. Hilarie Fredrickson for candidal esophagitis with improvement in symptoms. Would like to repeat course of Diflucan. No fever, chills. No NV. No oral lesions.  Past medical, surgical, family and social history per today's encounter.  Review of Systems  Constitutional: Negative for fever, chills, appetite change, fatigue and unexpected weight change.  HENT: Positive for trouble swallowing. Negative for congestion and sore throat.   Eyes: Negative for visual disturbance.  Respiratory: Negative for cough and shortness of breath.   Cardiovascular: Negative for chest pain and leg swelling.  Gastrointestinal: Negative for nausea, vomiting, abdominal pain, diarrhea and constipation.  Musculoskeletal: Negative for myalgias and arthralgias.  Skin: Negative for color change and rash.  Neurological: Positive for light-headedness.  Hematological: Negative for adenopathy. Does not bruise/bleed easily.  Psychiatric/Behavioral: Negative for suicidal ideas, sleep disturbance and dysphoric mood. The patient is not nervous/anxious.        Objective:    BP 177/69 mmHg  Pulse 69  Temp(Src) 98.1 F (36.7 C) (Oral)  Ht 5' 3.5" (1.613 m)  Wt 166 lb 12 oz (75.637 kg)  BMI 29.07 kg/m2  SpO2 97% Physical Exam  Constitutional: She is oriented to person, place, and time. She appears well-developed and well-nourished. No distress.  HENT:  Head: Normocephalic and atraumatic.    Right Ear: External ear normal.  Left Ear: External ear normal.  Nose: Nose normal.  Mouth/Throat: Posterior oropharyngeal erythema present. No oropharyngeal exudate.  Eyes: Conjunctivae are normal. Pupils are equal, round, and reactive to light. Right eye exhibits no discharge. Left eye exhibits no discharge. No scleral icterus.  Neck: Normal range of motion. Neck supple. No tracheal deviation present. No thyromegaly present.  Cardiovascular: Normal rate, regular rhythm, normal heart sounds and intact distal pulses.  Exam reveals no gallop and no friction rub.   No murmur heard. Pulmonary/Chest: Effort normal and breath sounds normal. No accessory muscle usage. No tachypnea. No respiratory distress. She has no decreased breath sounds. She has no wheezes. She has no rhonchi. She has no rales. She exhibits no tenderness.  Musculoskeletal: Normal range of motion. She exhibits no edema or tenderness.  Lymphadenopathy:    She has no cervical adenopathy.  Neurological: She is alert and oriented to person, place, and time. No cranial nerve deficit. She exhibits normal muscle tone. Coordination normal.  Skin: Skin is warm and dry. No rash noted. She is not diaphoretic. No erythema. No pallor.  Psychiatric: She has a normal mood and affect. Her behavior is normal. Judgment and thought content normal.          Assessment & Plan:   Problem List Items Addressed This Visit      Unprioritized   Candida esophagitis    Pt reports recurrent symptoms of dysphagia, which were caused by candidal esophagitis in the past. Will start course of Diflucan. If no improvement, then we discussed referral back to Dr. Hilarie Fredrickson for possible repeat endoscopy.  Relevant Medications   fluconazole (DIFLUCAN) tablet 100 mg   Diabetes type 1, controlled - Primary    Will check A1c with labs today. Pt reports good control of BG. Continue insulin pump. Follow up with Dr. Gabriel Carina as scheduled.      Relevant  Medications   losartan (COZAAR) tablet   atorvastatin (LIPITOR) tablet   Other Relevant Orders   Comprehensive metabolic panel   Lipid panel   Hemoglobin A1c   Hyperlipidemia    Will check lipids and lfts with labs today.      Relevant Medications   losartan (COZAAR) tablet   metoprolol tartrate (LOPRESSOR) tablet   furosemide (LASIX) tablet   atorvastatin (LIPITOR) tablet   Other Relevant Orders   Lipid panel   Hypertension    BP Readings from Last 3 Encounters:  05/15/14 177/69  02/18/14 158/70  01/15/14 141/62   BP elevated. Dr. Rockey Situ recently added Clonidine. Pt reports feeling lightheaded on medication with labile BP. Discussed potentially starting back on Amlodipine. Will check with Dr. Rockey Situ first then contact pt.      Relevant Medications   losartan (COZAAR) tablet   metoprolol tartrate (LOPRESSOR) tablet   furosemide (LASIX) tablet   atorvastatin (LIPITOR) tablet   RA (rheumatoid arthritis)    Symptoms well controlled on current medications, including Enbrel and Methotrexate. Will continue. Check CBC and CMP with labs today.      Relevant Medications   methotrexate tablet   ibuprofen (MOTRIN) tablet   Other Relevant Orders   CBC with Differential       Return for Wellness Visit.

## 2014-05-15 NOTE — Assessment & Plan Note (Signed)
Will check A1c with labs today. Pt reports good control of BG. Continue insulin pump. Follow up with Dr. Gabriel Carina as scheduled.

## 2014-05-15 NOTE — Patient Instructions (Addendum)
Labs today.  Hold Atorvastatin. Start Fluconazole 100mg  daily to help with esophagitis. If no improvement, we will set up follow up with Dr. Hilarie Fredrickson.  Follow up 6 months.

## 2014-05-15 NOTE — Progress Notes (Signed)
Pre visit review using our clinic review tool, if applicable. No additional management support is needed unless otherwise documented below in the visit note. 

## 2014-05-15 NOTE — Assessment & Plan Note (Signed)
Symptoms well controlled on current medications, including Enbrel and Methotrexate. Will continue. Check CBC and CMP with labs today.

## 2014-05-16 LAB — COMPREHENSIVE METABOLIC PANEL WITH GFR
ALT: 16 U/L (ref 0–35)
AST: 23 U/L (ref 0–37)
Albumin: 3.7 g/dL (ref 3.5–5.2)
Alkaline Phosphatase: 49 U/L (ref 39–117)
BUN: 14 mg/dL (ref 6–23)
CO2: 25 meq/L (ref 19–32)
Calcium: 9.4 mg/dL (ref 8.4–10.5)
Chloride: 103 meq/L (ref 96–112)
Creatinine, Ser: 0.64 mg/dL (ref 0.40–1.20)
GFR: 97.39 mL/min
Glucose, Bld: 174 mg/dL — ABNORMAL HIGH (ref 70–99)
Potassium: 3.9 meq/L (ref 3.5–5.1)
Sodium: 135 meq/L (ref 135–145)
Total Bilirubin: 0.4 mg/dL (ref 0.2–1.2)
Total Protein: 7.3 g/dL (ref 6.0–8.3)

## 2014-05-16 LAB — CBC WITH DIFFERENTIAL/PLATELET
Basophils Absolute: 0 10*3/uL (ref 0.0–0.1)
Basophils Relative: 0.4 % (ref 0.0–3.0)
EOS PCT: 1.9 % (ref 0.0–5.0)
Eosinophils Absolute: 0.1 10*3/uL (ref 0.0–0.7)
HEMATOCRIT: 38.3 % (ref 36.0–46.0)
HEMOGLOBIN: 13 g/dL (ref 12.0–15.0)
LYMPHS PCT: 36.1 % (ref 12.0–46.0)
Lymphs Abs: 1.5 10*3/uL (ref 0.7–4.0)
MCHC: 34 g/dL (ref 30.0–36.0)
MCV: 91.6 fl (ref 78.0–100.0)
Monocytes Absolute: 0.4 10*3/uL (ref 0.1–1.0)
Monocytes Relative: 10.6 % (ref 3.0–12.0)
NEUTROS ABS: 2.1 10*3/uL (ref 1.4–7.7)
NEUTROS PCT: 51 % (ref 43.0–77.0)
PLATELETS: 170 10*3/uL (ref 150.0–400.0)
RBC: 4.18 Mil/uL (ref 3.87–5.11)
RDW: 13.9 % (ref 11.5–15.5)
WBC: 4 10*3/uL (ref 4.0–10.5)

## 2014-05-16 LAB — LIPID PANEL
Cholesterol: 172 mg/dL (ref 0–200)
HDL: 75.2 mg/dL (ref >39.00)
LDL Cholesterol: 83 mg/dL (ref 0–99)
NonHDL: 96.8
Total CHOL/HDL Ratio: 2
Triglycerides: 70 mg/dL (ref 0.0–149.0)
VLDL: 14 mg/dL (ref 0.0–40.0)

## 2014-05-16 LAB — HEMOGLOBIN A1C: Hgb A1c MFr Bld: 8 % — ABNORMAL HIGH (ref 4.6–6.5)

## 2014-05-16 NOTE — Progress Notes (Signed)
[name], Please let pt know to resume Amlodipine 5mg  daily. If she does not have these tablets, we can call in a refill for her.

## 2014-05-20 ENCOUNTER — Other Ambulatory Visit: Payer: Self-pay | Admitting: *Deleted

## 2014-05-20 MED ORDER — AMLODIPINE BESYLATE 5 MG PO TABS: 5.0000 mg | ORAL_TABLET | Freq: Every day | ORAL | Status: DC

## 2014-05-20 NOTE — Progress Notes (Signed)
Sent Rx for Amlodipine 5mg  to pharmacy

## 2014-05-22 DIAGNOSIS — E109 Type 1 diabetes mellitus without complications: Secondary | ICD-10-CM | POA: Diagnosis not present

## 2014-06-12 ENCOUNTER — Encounter: Payer: Self-pay | Admitting: Internal Medicine

## 2014-06-12 ENCOUNTER — Ambulatory Visit (INDEPENDENT_AMBULATORY_CARE_PROVIDER_SITE_OTHER): Payer: Medicare Other | Admitting: Internal Medicine

## 2014-06-12 VITALS — BP 125/69 | HR 80 | Temp 97.9°F | Ht 63.0 in | Wt 166.1 lb

## 2014-06-12 DIAGNOSIS — Z0000 Encounter for general adult medical examination without abnormal findings: Secondary | ICD-10-CM | POA: Diagnosis not present

## 2014-06-12 NOTE — Progress Notes (Signed)
Subjective:    Patient ID: [name], female    DOB: May 24, 1945, 69 y.o.   MRN: [id number]  HPI  The patient is here for annual Medicare Wellness Examination and management of other chronic and acute problems.   The risk factors are reflected in the history.  The roster of all physicians providing medical care to patient - is listed in the Snapshot section of the chart.  Activities of daily living:   The patient is 100% independent in all ADLs: dressing, toileting, feeding as well as independent mobility. Patient lives with husband in one story home. Has hard wood floors. No pets.  Home safety :  The patient has smoke detectors in the home.  They wear seatbelts in their car. There are no firearms at home.  There is no violence in the home. They feel safe where they live.  Infectious Risks: There is no risks for hepatitis, STDs or HIV.  There is no  history of blood transfusion.  Pt unsure if blood given during CABG. They have no travel history to infectious disease endemic areas of the world.  Additional Health Care Providers: The patient has seen their dentist in the last six months. Dentist - Dr. Quillian Quince They have seen their eye doctor in the last year. Opthalmologist - Dr. Gloriann Loan They deny hearing issues. They have deferred audiologic testing in the last year.   They do not  have excessive sun exposure. Discussed the need for sun protection: hats,long sleeves and use of sunscreen if there is significant sun exposure.  Dermatologist - none Cardiologist - Dr. Rockey Situ Rheumatologist - Dr. Jefm Bryant Endocrinologist - Dr. Gabriel Carina Gastroenterologist - Dr. Cheryl Flash - Dr. Milinda Pointer  Diet: the importance of a healthy diet is discussed. They do have a healthy diet.  The benefits of regular aerobic exercise were discussed. Patient exercises by at gym using treadmill and bike. Some weight training.  Depression screen: there are no signs or vegative symptoms of depression-  irritability, change in appetite, anhedonia, sadness/tearfullness.  Cognitive assessment: the patient manages all their financial and personal affairs and is actively engaged. They could relate day,date,year and events.  HCPOA - husband, Alexine Pilant  The following portions of the patient's history were reviewed and updated as appropriate: allergies, current medications, past family history, past medical history,  past surgical history, past social history and problem list.  Visual acuity was not assessed per patient preference as they have regular follow up with their ophthalmologist. Hearing and body mass index were assessed and reviewed.   During the course of the visit the patient was educated and counseled about appropriate screening and preventive services including : fall prevention , diabetes screening, nutrition counseling, colorectal cancer screening, and recommended immunizations.     Past medical, surgical, family and social history per today's encounter.  Review of Systems  Constitutional: Negative for fever, chills, appetite change, fatigue and unexpected weight change.  Eyes: Negative for visual disturbance.  Respiratory: Negative for shortness of breath.   Cardiovascular: Negative for chest pain and leg swelling.  Gastrointestinal: Negative for nausea, vomiting, abdominal pain, diarrhea and constipation.  Musculoskeletal: Negative for myalgias and arthralgias.  Skin: Negative for color change and rash.  Hematological: Negative for adenopathy. Does not bruise/bleed easily.  Psychiatric/Behavioral: Negative for sleep disturbance and dysphoric mood. The patient is not nervous/anxious.        Objective:    BP 125/69 mmHg  Pulse 80  Temp(Src) 97.9 F (36.6 C) (Oral)  Ht  5\' 3"  (1.6 m)  Wt 166 lb 2 oz (75.354 kg)  BMI 29.44 kg/m2  SpO2 97% Physical Exam  Constitutional: She is oriented to person, place, and time. She appears well-developed and well-nourished. No  distress.  HENT:  Head: Normocephalic and atraumatic.  Right Ear: External ear normal.  Left Ear: External ear normal.  Nose: Nose normal.  Mouth/Throat: Oropharynx is clear and moist. No oropharyngeal exudate.  Eyes: Conjunctivae are normal. Pupils are equal, round, and reactive to light. Right eye exhibits no discharge. Left eye exhibits no discharge. No scleral icterus.  Neck: Normal range of motion. Neck supple. No tracheal deviation present. No thyromegaly present.  Cardiovascular: Normal rate, regular rhythm, normal heart sounds and intact distal pulses.  Exam reveals no gallop and no friction rub.   No murmur heard. Pulmonary/Chest: Effort normal and breath sounds normal. No accessory muscle usage. No tachypnea. No respiratory distress. She has no decreased breath sounds. She has no wheezes. She has no rales. She exhibits no tenderness. Right breast exhibits no inverted nipple, no mass, no nipple discharge, no skin change and no tenderness. Left breast exhibits no inverted nipple, no mass, no nipple discharge, no skin change and no tenderness. Breasts are symmetrical.  Abdominal: Soft. Bowel sounds are normal. She exhibits no distension and no mass. There is no tenderness. There is no rebound and no guarding.  Musculoskeletal: Normal range of motion. She exhibits no edema or tenderness.  Lymphadenopathy:    She has no cervical adenopathy.  Neurological: She is alert and oriented to person, place, and time. No cranial nerve deficit. She exhibits normal muscle tone. Coordination normal.  Skin: Skin is warm and dry. No rash noted. She is not diaphoretic. No erythema. No pallor.  Psychiatric: She has a normal mood and affect. Her behavior is normal. Judgment and thought content normal.          Assessment & Plan:   Problem List Items Addressed This Visit      Unprioritized   Medicare annual wellness visit, subsequent - Primary    General medical exam including breast exam normal  today. PAP and pelvic deferred given age and preference. Mammogram UTD and reviewed. DEXA UTD and reviewed. Colonoscopy UTD and reviewed. Reviewed recent labs. Encouraged continued healthy diet and exercise. Immunizations are UTD.          Return in about 6 months (around 12/11/2014) for Recheck.

## 2014-06-12 NOTE — Patient Instructions (Signed)

## 2014-06-12 NOTE — Progress Notes (Signed)
Pre visit review using our clinic review tool, if applicable. No additional management support is needed unless otherwise documented below in the visit note. 

## 2014-06-12 NOTE — Assessment & Plan Note (Signed)
General medical exam including breast exam normal today. PAP and pelvic deferred given age and preference. Mammogram UTD and reviewed. DEXA UTD and reviewed. Colonoscopy UTD and reviewed. Reviewed recent labs. Encouraged continued healthy diet and exercise. Immunizations are UTD.

## 2014-06-17 DIAGNOSIS — E109 Type 1 diabetes mellitus without complications: Secondary | ICD-10-CM | POA: Diagnosis not present

## 2014-06-24 DIAGNOSIS — E109 Type 1 diabetes mellitus without complications: Secondary | ICD-10-CM | POA: Insufficient documentation

## 2014-06-24 DIAGNOSIS — Z9641 Presence of insulin pump (external) (internal): Secondary | ICD-10-CM | POA: Diagnosis not present

## 2014-07-04 ENCOUNTER — Telehealth: Payer: Self-pay | Admitting: Internal Medicine

## 2014-07-04 NOTE — Telephone Encounter (Signed)
Left message for pt to call back.  Pt states she is having difficulty swallowing and needs to be seen. Pt scheduled to see Alonza Bogus PA 07/08/14@2pm . Pt aware of appt.

## 2014-07-08 ENCOUNTER — Ambulatory Visit (INDEPENDENT_AMBULATORY_CARE_PROVIDER_SITE_OTHER): Payer: Medicare Other | Admitting: Gastroenterology

## 2014-07-08 ENCOUNTER — Encounter: Payer: Self-pay | Admitting: Gastroenterology

## 2014-07-08 VITALS — BP 142/60 | HR 76 | Ht 62.5 in | Wt 166.0 lb

## 2014-07-08 DIAGNOSIS — K219 Gastro-esophageal reflux disease without esophagitis: Secondary | ICD-10-CM

## 2014-07-08 DIAGNOSIS — R1314 Dysphagia, pharyngoesophageal phase: Secondary | ICD-10-CM | POA: Insufficient documentation

## 2014-07-08 DIAGNOSIS — Z8719 Personal history of other diseases of the digestive system: Secondary | ICD-10-CM

## 2014-07-08 MED ORDER — RANITIDINE HCL 150 MG PO TABS: 150.0000 mg | ORAL_TABLET | Freq: Two times a day (BID) | ORAL | Status: DC

## 2014-07-08 NOTE — Patient Instructions (Addendum)
Continue the Pantoprazole sodium 40 mg daily. We sent a prescription for Zantac 150 mg twice daily to :[place].   You have been scheduled for an endoscopy. Please follow written instructions given to you at your visit today. If you use inhalers (even only as needed), please bring them with you on the day of your procedure. Your physician has requested that you go to www.startemmi.com and enter the access code given to you at your visit today. This web site gives a general overview about your procedure. However, you should still follow specific instructions given to you by our office regarding your preparation for the procedure.  _  _   INSULIN PUMP MEDICATION INSTRUCTIONS We will contact the physician managing your diabetic care for written dosage instructions for the day before your procedure and the day of your procedure.  Once we have received the instructions, we will contact you.

## 2014-07-08 NOTE — Progress Notes (Addendum)
     07/08/2014 [name] [id number] [birth year]   History of Present Illness:  This is a 69 year old female with PMH of rheumatoid arthritis, diabetes, GERD with history of erosive esophagitis, hypertension, hyperlipidemia, Schatzki's ring, history of candidal esophagitis, and CAD status post three-vessel CABG who seen in followup. She is here alone today and was last seen in 01/2013.  She has been seen on multiple occasions for dysphagia and always seems to respond to a 21 day course of fluconazole for empiric treatment of candidiasis.  She says that in January she started noticing recurrence of her dysphagia symptoms. This is most notable with solid foods such as breads and meats. This is similar to her symptoms when she was last treated for Candida esophagitis, so her PCP treated her with a 21 day course of fluconazole.  She finished that on February 15th.  Says that while she was on the medication her symptoms seemed to be better, but immediately after stopping the medication her symptoms returned again and seem worse than before.  She was previously on pantoprazole 40 mg BID, but says that she's had a lot of diarrhea at that dose so has decreased it to once a day.  The diarrhea has resolved, but she now has more heartburn and reflux.  Tums is not helping.  Last EGD was 10/2011 by Dr. Candace Cruise at [facility] at which time she had a hiatal hernia and benign-appearing esophageal stricture that was dilated.   Just of note, colonoscopy 05/2009 showed only internal hemorrhoids (performed by Dr. Dionne Milo at [facility]).   Current Medications, Allergies, Past Medical History, Past Surgical History, Family History and Social History were reviewed in [employer] record.   Physical Exam: BP 142/60 mmHg  Pulse 76  Ht 5' 2.5" (1.588 m)  Wt 166 lb (75.297 kg)  BMI 29.86 kg/m2 General: Well developed white female in no acute distress Head:  Normocephalic and atraumatic Eyes:  Sclerae anicteric, conjunctiva pink  Ears: Normal auditory acuity Lungs: Clear throughout to auscultation Heart: Regular rate and rhythm Abdomen: Soft, non-distended.  Normal bowel sounds.  Non-tender. Musculoskeletal: Symmetrical with no gross deformities  Extremities: No edema  Neurological: Alert oriented x 4, grossly non-focal Psychological:  Alert and cooperative. Normal mood and affect  Assessment and Recommendations: 69 year old female with PMH of rheumatoid arthritis, diabetes, GERD with history of erosive esophagitis, hypertension, hyperlipidemia, Schatzki's ring, history of candidal esophagitis, and CAD status post three-vessel CABG.  1.  Dysphagia/history of Candida esophagitis/history of stricture -- each time over the past couple of years that she has developed dysphagia she has improved completely with fluconazole. She previously had multiple endoscopies with dilation, which have not necessarily improved dysphagia.  She was recently treated again with fluconazole by her PCP for recurrent symptoms, but symptoms returned again immediately after stopping the medication.  Will schedule EGD with Dr. Hilarie Fredrickson.  The risks, benefits, and alternatives were discussed with the patient and she consents to proceed.   2.  GERD:  She had diarrhea with pantoprazole 40 mg BID so has now been taking it only once daily.  Diarrhea has resolved, but now having more heartburn/reflux.  Will continue pantoprazole 40 mg daily and add Zantac 150 mg BID as well.  Addendum: Reviewed and agree with initial management. Jerene Bears, MD

## 2014-07-15 ENCOUNTER — Other Ambulatory Visit: Payer: Self-pay | Admitting: *Deleted

## 2014-07-15 MED ORDER — AMLODIPINE BESYLATE 5 MG PO TABS: 5.0000 mg | ORAL_TABLET | Freq: Every day | ORAL | Status: DC

## 2014-08-01 ENCOUNTER — Encounter: Payer: Self-pay | Admitting: Internal Medicine

## 2014-08-04 ENCOUNTER — Ambulatory Visit (INDEPENDENT_AMBULATORY_CARE_PROVIDER_SITE_OTHER): Payer: Medicare Other | Admitting: Podiatry

## 2014-08-04 DIAGNOSIS — B351 Tinea unguium: Secondary | ICD-10-CM

## 2014-08-04 DIAGNOSIS — M79676 Pain in unspecified toe(s): Secondary | ICD-10-CM | POA: Diagnosis not present

## 2014-08-04 NOTE — Progress Notes (Signed)
She presents today with a chief complaint of painful elongated toenails one through 5 bilateral.  Objective: Nails are thick yellow dystrophic with mycotic and painful palpation. Pulses are palpable bilateral.  Assessment: Pain in limb second Mrs. 1 through 5 bilateral.  Plan: Debridement of nails 1 through 5 bilateral covered service secondary to pain.

## 2014-08-11 ENCOUNTER — Ambulatory Visit (AMBULATORY_SURGERY_CENTER): Payer: Medicare Other | Admitting: Internal Medicine

## 2014-08-11 ENCOUNTER — Encounter: Payer: Self-pay | Admitting: Internal Medicine

## 2014-08-11 VITALS — BP 133/48 | HR 63 | Temp 98.1°F | Resp 24 | Ht 62.5 in | Wt 166.0 lb

## 2014-08-11 DIAGNOSIS — K222 Esophageal obstruction: Secondary | ICD-10-CM | POA: Diagnosis not present

## 2014-08-11 DIAGNOSIS — R1314 Dysphagia, pharyngoesophageal phase: Secondary | ICD-10-CM | POA: Diagnosis not present

## 2014-08-11 DIAGNOSIS — K219 Gastro-esophageal reflux disease without esophagitis: Secondary | ICD-10-CM | POA: Diagnosis not present

## 2014-08-11 LAB — GLUCOSE, CAPILLARY
Glucose-Capillary: 202 mg/dL — ABNORMAL HIGH (ref 70–99)
Glucose-Capillary: 170 mg/dL — ABNORMAL HIGH (ref 70–99)

## 2014-08-11 MED ORDER — SODIUM CHLORIDE 0.9 % IV SOLN
500.0000 mL | INTRAVENOUS | Status: DC
Start: 2014-08-11 — End: 2014-08-11

## 2014-08-11 NOTE — Op Note (Signed)
[facility] [facility]  [address] [place], [address]   ENDOSCOPY PROCEDURE REPORT  PATIENT: [name], [name]  MR#: [id number] BIRTHDATE: 18-Jul-1945 , 32  yrs. old GENDER: female ENDOSCOPIST: Jerene Bears, MD PROCEDURE DATE:  08/11/2014 PROCEDURE:  EGD w/ balloon dilation and EGD w/ biopsy ASA CLASS:     Class III INDICATIONS:  dysphagia and GERD, history of esophagitis and Schatzki's ring. MEDICATIONS: Monitored anesthesia care, Propofol 200 mg IV, and Lidocaine 150 mg IV TOPICAL ANESTHETIC: none  DESCRIPTION OF PROCEDURE: After the risks benefits and alternatives of the procedure were thoroughly explained, informed consent was obtained.  The LB [id number] endoscope was introduced through the mouth and advanced to the second portion of the duodenum , Without limitations.  The instrument was slowly withdrawn as the mucosa was fully examined.   ESOPHAGUS: The mucosa of the esophagus appeared normal.   A Schatzki ring was found at the gastroesophageal junction.  Multiple biopsies were performed using cold forceps.  Using a TTS-balloon the stricture was dilated up to 4mm.  The balloon was held inflated for 60 seconds.  Following this dilation, there was a small mucosal rent.  STOMACH: A 3 cm hiatal hernia was noted.   The mucosa of the stomach appeared normal.  DUODENUM: The duodenal mucosa showed no abnormalities in the bulb and 2nd part of the duodenum.  Retroflexed views revealed a hiatal hernia.     The scope was then withdrawn from the patient and the procedure completed.  COMPLICATIONS: There were no immediate complications.  ENDOSCOPIC IMPRESSION: 1.   The mucosa of the esophagus appeared normal 2.   Schatzki ring was found at the gastroesophageal junction; balloon dilation performed followed by multiple biopsies to further disrupt the ring and rule out Barrett's change 3.   3 cm hiatal hernia 4.   The mucosa of the stomach appeared normal 5.    The duodenal mucosa showed no abnormalities in the bulb and 2nd part of the duodenum  RECOMMENDATIONS: 1.  Await biopsy results 2.  Continue current GERD therapy with pantoprazole and ranitidine 3.  Repeat dilation as needed  eSigned:  Jerene Bears, MD 08/11/2014 10:23 AM     CC:The Patient and Ronette Deter MD

## 2014-08-11 NOTE — Progress Notes (Signed)
Called to room to assist during endoscopic procedure.  Patient ID and intended procedure confirmed with present staff. Received instructions for my participation in the procedure from the performing physician.  

## 2014-08-11 NOTE — Progress Notes (Signed)
Report to PACU, RN, vss, BBS= Clear.  

## 2014-08-11 NOTE — Patient Instructions (Signed)
YOU HAD AN ENDOSCOPIC PROCEDURE TODAY AT [facility]:   Refer to the procedure report that was given to you for any specific questions about what was found during the examination.  If the procedure report does not answer your questions, please call your gastroenterologist to clarify.  If you requested that your care partner not be given the details of your procedure findings, then the procedure report has been included in a sealed envelope for you to review at your convenience later.  YOU SHOULD EXPECT: Some feelings of bloating in the abdomen. Passage of more gas than usual.  Walking can help get rid of the air that was put into your GI tract during the procedure and reduce the bloating. If you had a lower endoscopy (such as a colonoscopy or flexible sigmoidoscopy) you may notice spotting of blood in your stool or on the toilet paper. If you underwent a bowel prep for your procedure, you may not have a normal bowel movement for a few days.  Please Note:  You might notice some irritation and congestion in your nose or some drainage.  This is from the oxygen used during your procedure.  There is no need for concern and it should clear up in a day or so.  SYMPTOMS TO REPORT IMMEDIATELY:   Following upper endoscopy (EGD)  Vomiting of blood or coffee ground material  New chest pain or pain under the shoulder blades  Painful or persistently difficult swallowing  New shortness of breath  Fever of 100F or higher  Black, tarry-looking stools  For urgent or emergent issues, a gastroenterologist can be reached at any hour by calling [phone].   DIET: Your first meal following the procedure should be a small meal and then it is ok to progress to your normal diet. Heavy or fried foods are harder to digest and may make you feel nauseous or bloated.  Likewise, meals heavy in dairy and vegetables can increase bloating.  Drink plenty of fluids but you should avoid alcoholic beverages for  24 hours.  ACTIVITY:  You should plan to take it easy for the rest of today and you should NOT DRIVE or use heavy machinery until tomorrow (because of the sedation medicines used during the test).    FOLLOW UP: Our staff will call the number listed on your records the next business day following your procedure to check on you and address any questions or concerns that you may have regarding the information given to you following your procedure. If we do not reach you, we will leave a message.  However, if you are feeling well and you are not experiencing any problems, there is no need to return our call.  We will assume that you have returned to your regular daily activities without incident.  If any biopsies were taken you will be contacted by phone or by letter within the next 1-3 weeks.  Please call us at [phone] if you have not heard about the biopsies in 3 weeks.    SIGNATURES/CONFIDENTIALITY: You and/or your care partner have signed paperwork which will be entered into your electronic medical record.  These signatures attest to the fact that that the information above on your After Visit Summary has been reviewed and is understood.  Full responsibility of the confidentiality of this discharge information lies with you and/or your care-partner.  Post esophageal instructions given with GERD and hiatal hernia information.

## 2014-08-12 ENCOUNTER — Telehealth: Payer: Self-pay | Admitting: *Deleted

## 2014-08-12 NOTE — Telephone Encounter (Signed)
  Follow up Call-  Call back number 08/11/2014  Post procedure Call Back phone  # [phone]  Permission to leave phone message Yes     Patient questions:  Do you have a fever, pain , or abdominal swelling? No. Pain Score  0 *  Have you tolerated food without any problems? Yes.    Have you been able to return to your normal activities? Yes.    Do you have any questions about your discharge instructions: Diet   No. Medications  No. Follow up visit  No.  Do you have questions or concerns about your Care? No.  Actions: * If pain score is 4 or above: No action needed, pain <4.

## 2014-08-18 ENCOUNTER — Encounter: Payer: Self-pay | Admitting: Internal Medicine

## 2014-08-19 ENCOUNTER — Ambulatory Visit (INDEPENDENT_AMBULATORY_CARE_PROVIDER_SITE_OTHER): Payer: Medicare Other | Admitting: Cardiovascular Disease

## 2014-08-19 ENCOUNTER — Encounter: Payer: Self-pay | Admitting: Cardiovascular Disease

## 2014-08-19 VITALS — BP 140/56 | HR 62 | Ht 63.0 in | Wt 163.5 lb

## 2014-08-19 DIAGNOSIS — I25810 Atherosclerosis of coronary artery bypass graft(s) without angina pectoris: Secondary | ICD-10-CM

## 2014-08-19 DIAGNOSIS — I10 Essential (primary) hypertension: Secondary | ICD-10-CM

## 2014-08-19 DIAGNOSIS — E785 Hyperlipidemia, unspecified: Secondary | ICD-10-CM

## 2014-08-19 DIAGNOSIS — E109 Type 1 diabetes mellitus without complications: Secondary | ICD-10-CM | POA: Diagnosis not present

## 2014-08-19 MED ORDER — ATORVASTATIN CALCIUM 80 MG PO TABS: 80.0000 mg | ORAL_TABLET | Freq: Every day | ORAL | Status: DC

## 2014-08-19 NOTE — Assessment & Plan Note (Signed)
Currently with no symptoms of angina. No further workup at this time. Continue current medication regimen. 

## 2014-08-19 NOTE — Patient Instructions (Addendum)
You are doing well.  Please increase the lipitor up to 80 mg daily, for cholesterol  Please call us if you have new issues that need to be addressed before your next appt.  Your physician wants you to follow-up in: 6 months.  You will receive a reminder letter in the mail two months in advance. If you don't receive a letter, please call our office to schedule the follow-up appointment.

## 2014-08-19 NOTE — Assessment & Plan Note (Signed)
Discussed that her total cholesterol and LDL are both goal. Recommended that she increase her Lipitor up to 80 mg daily

## 2014-08-19 NOTE — Assessment & Plan Note (Signed)
Blood pressure is well controlled on today's visit. No changes made to the medications. Recommended she monitor her numbers at home as she has been doing

## 2014-08-19 NOTE — Progress Notes (Signed)
Patient ID: [name], female    DOB: [birth year], 69 y.o.   MRN: [id number]  HPI Comments: Ms. 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 She had spiking fevers, chills, severe shortness of breath. Sugars were in the 400s. She had a long course of antibiotics to her hospital visit. Secondary to significant cough, she was unable to lie flat for cardiac catheterization. She declined cardiac catheterization on the same hospital visit. On admission to the hospital, her immunosuppressants, Enbrel and methotrexate were held She had cardiac catheterization in followup after her pneumonia resolved. This showed severe three-vessel CAD. She was transferred from [facility] to [place] for bypass surgery. She had four-vessel bypass surgery with LIMA to the LAD, saphenous vein graft to the diagonal, vein graft to the OM, vein graft to the PDA. She reports relatively uneventful surgery. She was admitted on June 19, discharge June 24.  She presents today for follow-up of her coronary disease, history of bypass   In followup today, she reports that she is doing very well.  She reports that she has been monitoring her blood pressure at home and typically this is within a good range. She states that her sugars are better recently. Total cholesterol 173, LDL 83 January 2016 She denies having any shortness of breath, chest pain. Otherwise is active.  she has seen Precious Reel for her rheumatoid arthritis, on Enbrel and methotrexate. She reports symptoms are stable  EKG on today's visit shows normal sinus rhythm with rate 62 bpm, no significant ST or T-wave changes, old inferior MI, old anteroseptal  MI  Other past medical history  Echocardiogram 12/23/2012 showed normal ejection fraction with no wall motion abnormality, normal right ventricular size and function, moderately elevated right ventricular systolic pressures.  Cardiac catheterization showed 100% proximally occluded RCA, critical ostial LAD disease, severe mid LAD disease, severe mid left circumflex disease, ejection fraction estimated at 50-55% with mild hypokinesis of the inferior wall.  Allergies  Allergen Reactions  . Codeine     nausea    Outpatient Encounter Prescriptions as of 08/19/2014  Medication Sig  . amLODipine (NORVASC) 5 MG tablet Take by mouth.  Marland Kitchen aspirin 81 MG tablet Take 81 mg by mouth daily.  Marland Kitchen atorvastatin (LIPITOR) 80 MG tablet Take 1 tablet (80 mg total) by mouth daily.  . Cholecalciferol (VITAMIN D) 2000 UNITS CAPS Take 1 capsule by mouth daily.  Marland Kitchen etanercept (ENBREL) 25 MG injection Inject 25 mg into the skin once a week.  . folic acid (FOLVITE) 1 MG tablet Take 1 tablet (1 mg total) by mouth daily.  . furosemide (LASIX) 40 MG tablet Take 1 tablet (40 mg total) by mouth 2 (two) times daily.  Marland Kitchen glucose blood test strip Use as directed. Use one strip q 3 hrs while awake  . ibuprofen (ADVIL,MOTRIN) 800 MG tablet Take 1 tablet (800 mg total) by mouth 3 (three) times daily as needed.  . insulin lispro (HUMALOG) 100 UNIT/ML injection As directed via Insulin pump (Patient taking differently: As directed via INSULIN PUMP)  . loratadine (CLARITIN) 10 MG tablet Take 10 mg by mouth daily.  Marland Kitchen losartan (COZAAR) 100 MG tablet Take 1 tablet (100 mg total)  by mouth daily.  . methotrexate (RHEUMATREX) 2.5 MG tablet Take 4 tablets (10 mg total) by mouth once a week. Takes every Wednesday.Caution:Chemotherapy. Protect from light.  . metoprolol tartrate (LOPRESSOR) 25 MG tablet Take 1 tablet (25 mg total) by mouth 2 (two) times daily as needed.  . Multiple Vitamin (MULTIVITAMIN) tablet Take 1 tablet by mouth daily.    .  pantoprazole (PROTONIX) 40 MG tablet Take 1 tablet (40 mg total) by mouth 2 (two) times daily. (Patient taking differently: Take 40 mg by mouth daily. )  . ranitidine (ZANTAC) 150 MG tablet Take 1 tablet (150 mg total) by mouth 2 (two) times daily.  Marland Kitchen saccharomyces boulardii (FLORASTOR) 250 MG capsule Take 250 mg by mouth as needed.  . zoledronic acid (RECLAST) 5 MG/100ML SOLN Inject 5 mg into the vein once.  . [DISCONTINUED] atorvastatin (LIPITOR) 40 MG tablet Take 1 tablet (40 mg total) by mouth daily.    Past Medical History  Diagnosis Date  . Leukopenia 2012    s/p bone marrow biopsy, Dr. Ma Hillock  . Cholelithiasis   . GERD (gastroesophageal reflux disease)   . Hypertension   . Hyperlipidemia   . Esophageal stricture   . Chronic diastolic CHF (congestive heart failure)   . CAD (coronary artery disease)     a. 09/2012 Cath: LM nl, LAD 95p, 85m LCX 939mOM2 50, RCA 100.  . Marland Kitchenypokalemia   . Herniated disc   . Pneumonia 2013; 08/2012    "one lung; double" (10/18/2012)  . Exertional shortness of breath   . Type I diabetes mellitus     "dx'd in 1957" (10/18/2012)  . History of pancytopenia   . H/O hiatal hernia   . Rheumatoid arthritis(714.0)   . NSTEMI (non-ST elevated myocardial infarction) 08/2012    "mild" (10/18/2012)    Past Surgical History  Procedure Laterality Date  . Esophagogastroduodenoscopy  2012    Dr. IfMinna Merritts. Tubal ligation  1970  . Cataract extraction w/ intraocular lens implant Right 2010  . Cardiac catheterization  10/18/2012    "first one was today" (10/18/2012)  . Esophageal dilation      "3 or 4 times" (10/19/2011)  . Coronary artery bypass graft N/A 10/19/2012    Procedure: CORONARY ARTERY BYPASS GRAFTING (CABG);  Surgeon: StMelrose NakayamaMD;  [place] [facility] [facility];  Laterality: N/A;    Social History  reports that she quit smoking about 20 years ago. Her smoking use included Cigarettes. She has a 30 pack-year smoking history.  She has never used smokeless tobacco. She reports that she does not drink alcohol or use illicit drugs.  Family History family history includes Arthritis in her father and mother; Bone cancer in her sister; Diabetes in her father and mother.   Review of Systems  Constitutional: Negative.   HENT: Negative.   Eyes: Negative.   Respiratory: Negative.   Cardiovascular: Negative.   Gastrointestinal: Negative.   Endocrine: Negative.   Musculoskeletal: Positive for arthralgias.  Skin: Negative.   Allergic/Immunologic: Negative.   Neurological: Negative.   Hematological: Negative.   Psychiatric/Behavioral: Negative.   All other systems reviewed and are negative.   BP 140/56 mmHg  Pulse 62  Ht _0  (1.6 m)  Wt 163 lb 8 oz (74.163 kg)  BMI 28.97 kg/m2  Physical Exam  Constitutional: She is oriented to person, place, and time. She appears well-developed and well-nourished.  HENT:  Head: Normocephalic.  Nose: Nose normal.  Mouth/Throat: Oropharynx  is clear and moist.  Eyes: Conjunctivae are normal. Pupils are equal, round, and reactive to light.  Neck: Normal range of motion. Neck supple. No JVD present.  Cardiovascular: Normal rate, regular rhythm, S1 normal, S2 normal, normal heart sounds and intact distal pulses.  Exam reveals no gallop and no friction rub.   No murmur heard. Well healing mediastinal incision  Pulmonary/Chest: Effort normal and breath sounds normal. No respiratory distress. She has no wheezes. She has no rales. She exhibits no tenderness.  Abdominal: Soft. Bowel sounds are normal. She exhibits no distension. There is no tenderness.  Musculoskeletal: Normal range of motion. She exhibits no edema or tenderness.  Lymphadenopathy:    She has no cervical adenopathy.  Neurological: She is alert and oriented to person, place, and time. Coordination normal.  Skin: Skin is warm and dry. No rash noted. No erythema.  Psychiatric: She has a normal mood and affect. Her  behavior is normal. Judgment and thought content normal.    Assessment and Plan  Nursing note and vitals reviewed.

## 2014-08-19 NOTE — Assessment & Plan Note (Signed)
Encouraged a strict diet, weight loss through diet changes and exercise

## 2014-08-22 NOTE — H&P (Signed)
PATIENT NAME:  [name], [name] MR#:  [id number] DATE OF BIRTH:  06/16/1945  DATE OF ADMISSION:  09/21/2012  ADMITTING PHYSICIAN: Gladstone Lighter, MD  PRIMARY CARE PHYSICIAN: Eduard Clos. Walker, MD  CHIEF COMPLAINT: Difficulty breathing and cough and elevated sugars.   HISTORY OF PRESENT ILLNESS: Ms. Agro is a very pleasant 69 year old Caucasian female with past medical history significant for hypertension, rheumatoid arthritis, juvenile diabetes mellitus on insulin pump at home,  presents to the hospital secondary to difficulty breathing, started about 11 days ago. She says she has been having cold symptoms with upper respiratory tract infection symptoms for almost 11 days now. Went to see her PCP's nurse practitioner about 3 days ago, was started on Z-Pak. Has not felt much relief. Continues to spike fevers and having chills at home associated with some difficulty breathing. She has been having some productive cough, but unable to spit any sputum at this time. She denies any chest pain. Her sugars usually are well controlled at home with the insulin pump, which she manages by herself, has been running in the 400 range for the last couple of days so came to the ER for further management. In the ER, her sugar was greater than 400 and she had an elevated troponin of 2.4. She is being admitted for non-STEMI, hyperglycemia and also positive pneumonia.   PAST MEDICAL HISTORY: 1.  Hypertension.  2.  Diabetes mellitus, on insulin pump.  3.  Hyperlipidemia.  4.  Herniated disk with chronic low back pain.  5.  Rheumatoid arthritis.  6.  Gastroesophageal reflux disease.  7.  Intermittent dysphagia with a few dilatations in the past.  8.  History of pancytopenia at one time secondary to her rheumatoid arthritis medications.   PAST SURGICAL HISTORY: Esophageal dilatations.   ALLERGIES TO MEDICATIONS: CODEINE.  HOME MEDICATIONS: Still need to be verified by the pharmacist.   SOCIAL HISTORY: Lives  at home with her husband. No history of any smoking or alcohol use.   FAMILY HISTORY: Significant for heart disease in both parents. One sister had bone cancer and the other had a brain aneurysm.   REVIEW OF SYSTEMS:    CONSTITUTIONAL: Positive for fever and fatigue and weakness.  EYES: No blurred vision, double vision or glaucoma or cataracts.  EARS, NOSE, THROAT: No tinnitus, ear pain, hearing loss, epistaxis or discharge.  RESPIRATORY: Positive for cough. No wheezing. No hemoptysis. Positive for difficulty breathing.  CARDIOVASCULAR: No chest pain. No diaphoresis. No orthopnea, PND, arrhythmia or syncope.  GASTROINTESTINAL: Positive for nausea. No vomiting, diarrhea, abdominal pain, hematemesis or melena.  GENITOURINARY: No dysuria, hematuria, renal calculus, frequency or incontinence.  ENDOCRINE: No polyuria, nocturia, thyroid problems, heat or cold intolerance.  HEMATOLOGIC: No anemia, easy bruising or bleeding.  SKIN: No acne, rash or lesions.  MUSCULOSKELETAL: Positive for joint pain secondary to rheumatoid arthritis.  NEUROLOGICAL: No  numbness, weakness, CVA, TIA or seizures.  PSYCHOLOGICAL: No anxiety, insomnia, depression.   PHYSICAL EXAMINATION: VITAL SIGNS: Temperature is 99.5 degrees Fahrenheit, pulse 87, respirations 20, blood pressure 150/49,  pulse oximetry 97% on room air.  GENERAL: Well-built, well-nourished female lying in bed in mild respiratory distress and also cough.  HEENT: Normocephalic, atraumatic. Pupils are equal, round, reacting to light. Anicteric sclerae. Extraocular movements intact. Conjunctival pallor is absent. Nasopharynx is clear without any discharge or erythema. Oral cavity is clear without erythema, mass or exudates. Ears: No external lesions or discharge.  NECK: Supple. No thyromegaly, JVD or carotid bruits. No lymphadenopathy.  LUNGS: The patient has course wheeze bilaterally, but no use of accessory muscles for breathing. No crackles heard.   CARDIOVASCULAR: S1, S2, regular rate and rhythm. No murmurs, rubs or gallops.  ABDOMEN: Soft, nontender, nondistended. No hepatosplenomegaly. Normal bowel sounds.   EXTREMITIES: No pedal edema, no clubbing or cyanosis, 2+ dorsalis pedis pulses palpable bilaterally.  SKIN: No acne, rash or lesions.  LYMPHATIC: No cervical lymphadenopathy.  NEUROLOGICAL: Cranial nerves are intact. No focal motor or sensory deficits.  PSYCHOLOGICAL: The patient is awake, alert, oriented x 3.   LABORATORY, DIAGNOSTIC AND RADIOLOGICAL DATA: WBC 10.4, hemoglobin 11.5, hematocrit 33.6, platelet count 219.   Sodium 131, potassium 3.7, chloride 99, bicarbonate 21, BUN 16, creatinine 0.91, glucose 430, calcium 9.2.   ALT 23, AST 36, alkaline phosphatase 100, total bilirubin 0.7, albumin 3.2.   Urinalysis negative for any infection.   First set of troponin is 2.4. CK is 136. CK-MB is 4.6.   Chest x-ray showing diffuse bilateral interstitial thickening, likely representing interstitial edema or pneumonitis.   TSH within normal limits at 1.43.   BNP is elevated at 8236.   EKG showing normal sinus rhythm with ST depressions in the lateral leads, I, aVL and V4 to V6 leads.   ASSESSMENT AND PLAN: A 69 year old female with past medical history significant for hypertension, diabetes, rheumatoid arthritis, who had upper respiratory tract infection symptoms for almost 2 weeks now, recently started on Z-Pak, comes with intractable dyspnea, hyperglycemia, and also was found to have elevated troponin.  1.  Non-ST segment elevation myocardial infarction with no prior cardiac history but significant risk factors. She does not have chest pain, but she is a diabetic and could have atypical presentation as well. Her CPK and troponin are elevated so will admit to telemetry, recycle cardiac enzymes. She was already given a dose of Lovenox weight-based and continue the Lovenox, as-needed sublingual nitroglycerin, beta blocker and  statin. Echocardiogram ordered and cardiology has been consulted. Dr. Virl Axe from [facility] has been notified.  2.  Possible pneumonia with fever, difficulty breathing and cough going on for almost 2 weeks, not improved on Z-Pak. Blood cultures have been drawn. Will start on Levaquin per pneumonia protocol and also add Rocephin. Cough medications as needed. Also will give 1 dose of Lasix with that elevated BNP and follow up chest x-ray in morning.  3.  Insulin-dependent diabetes mellitus with hyperglycemia. On insulin pump at home. Will give  additional 15 units of regular insulin subcutaneous once and continue insulin pump for now. If  sugars remain persistently elevated, we will get endocrinology consult.  4.  Hypertension. For now, continue beta blocker and other home medications need to be verified at this time.  5.  Gastrointestinal and deep vein thrombosis prophylaxis: On Protonix and also Lovenox.  CODE STATUS: Full code.   TIME SPENT ON ADMISSION: 50 minutes.     ____________________________ Gladstone Lighter, MD [id number] D: 09/21/2012 19:02:18 ET T: 09/21/2012 19:23:09 ET JOB#: [id number]  cc: Gladstone Lighter, MD, <Dictator> Eduard Clos. Gilford Rile, MD Gladstone Lighter MD ELECTRONICALLY SIGNED 09/25/2012 14:51

## 2014-08-22 NOTE — Consult Note (Signed)
Chief Complaint:  Subjective/Chief Complaint seen for possible luminal evaluation in setting of IDA prior to cardiac cath-feeeling some better today, no n/v swallowing ok, no abdominal pain.   VITAL SIGNS/ANCILLARY NOTES: **Vital Signs.:   26-May-14 07:48  Vital Signs Type Routine  Temperature Temperature (F) 98  Celsius 36.6  Temperature Source oral  Pulse Pulse 80  Respirations Respirations 18  Systolic BP Systolic BP 329  Diastolic BP (mmHg) Diastolic BP (mmHg) 57  Mean BP 79  Pulse Ox % Pulse Ox % 94  Pulse Ox Activity Level  At rest  Oxygen Delivery 2L   Brief Assessment:  Cardiac Regular   Respiratory clear BS   Gastrointestinal details normal Soft  Nontender  Nondistended  No masses palpable  Bowel sounds normal   Lab Results: Routine Micro:  25-May-14 15:51   Micro Text Report CLOS.DIFF ASSAY, RT-PCR   COMMENT                   NEGATIVE-CLOS.DIFFICILE TOXIN NOT DETECTED BY PCR   ANTIBIOTIC                       Comment  1. NEGATIVE-CLOS.DIFFICILE TOXIN NOT DETECTED BY PCR ---------------------------------- Test procedure integrates sample purification, nucleic acid amplification, and detection of the target Clostridium difficile sequence in simple or complex samplesusing real-time PCR and RT-PCR assays.  Routine Chem:  26-May-14 04:49   Glucose, Serum  278  BUN 15  Creatinine (comp) 0.68  Sodium, Serum 137  Potassium, Serum 4.9  Chloride, Serum 105  CO2, Serum 21  Calcium (Total), Serum 8.6  Anion Gap 11  Osmolality (calc) 285  eGFR (African American) >60  eGFR (Non-African American) >60 (eGFR values <59m/min/1.73 m2 may be an indication of chronic kidney disease (CKD). Calculated eGFR is useful in patients with stable renal function. The eGFR calculation will not be reliable in acutely ill patients when serum creatinine is changing rapidly. It is not useful in  patients on dialysis. The eGFR calculation may not be applicable to patients at the low  and high extremes of body sizes, pregnant women, and vegetarians.)  Cardiac:  23-May-14 12:10   Troponin I  2.40 (0.00-0.05 0.05 ng/mL or less: NEGATIVE  Repeat testing in 3-6 hrs  if clinically indicated. >0.05 ng/mL: POTENTIAL  MYOCARDIAL INJURY. Repeat  testing in 3-6 hrs if  clinically indicated. NOTE: An increase or decrease  of 30% or more on serial  testing suggests a  clinically important change)    20:41   Troponin I  2.05 (0.00-0.05 0.05 ng/mL or less: NEGATIVE  Repeat testing in 3-6 hrs  if clinically indicated. >0.05 ng/mL: POTENTIAL  MYOCARDIAL INJURY. Repeat  testing in 3-6 hrs if  clinically indicated. NOTE: An increase or decrease  of 30% or more on serial  testing suggests a  clinically important change)  24-May-14 03:55   Troponin I  12.00 (0.00-0.05 0.05 ng/mL or less: NEGATIVE  Repeat testing in 3-6 hrs  if clinically indicated. >0.05 ng/mL: POTENTIAL  MYOCARDIAL INJURY. Repeat  testing in 3-6 hrs if  clinically indicated. NOTE: An increase or decrease  of 30% or more on serial  testing suggests a  clinically important change)    10:51   Troponin I  12.00 (0.00-0.05 0.05 ng/mL or less: NEGATIVE  Repeat testing in 3-6 hrs  if clinically indicated. >0.05 ng/mL: POTENTIAL  MYOCARDIAL INJURY. Repeat  testing in 3-6 hrs if  clinically indicated. NOTE: An increase or decrease  of 30% or more on serial  testing suggests a  clinically important change)  25-May-14 04:45   Troponin I  5.40 (0.00-0.05 0.05 ng/mL or less: NEGATIVE  Repeat testing in 3-6 hrs  if clinically indicated. >0.05 ng/mL: POTENTIAL  MYOCARDIAL INJURY. Repeat  testing in 3-6 hrs if  clinically indicated. NOTE: An increase or decrease  of 30% or more on serial  testing suggests a  clinically important change)  Routine Sero:  25-May-14 15:51   Occult Blood, Feces NEGATIVE (Result(s) reported on 23 Sep 2012 at 04:40PM.)  Routine Hem:  26-May-14 04:49   WBC (CBC) 5.7   RBC (CBC)  3.02  Hemoglobin (CBC)  9.6  Hematocrit (CBC)  27.8  Platelet Count (CBC) 247  MCV 92  MCH 31.7  MCHC 34.4  RDW  14.6  Neutrophil % 69.7  Lymphocyte % 15.5  Monocyte % 12.6  Eosinophil % 1.2  Basophil % 1.0  Neutrophil # 4.0  Lymphocyte #  0.9  Monocyte # 0.7  Eosinophil # 0.1  Basophil # 0.1 (Result(s) reported on 24 Sep 2012 at 06:10AM.)   Assessment/Plan:  Assessment/Plan:  Assessment 1) IDA.  uncertain etiology.  recent MI precluding luminal evaluation however patient has had both recent egd (finding of esophageal candidiasis in the setting of recurrent dysphagia) and colonoscopy with internal hemorrhoids.  Of note patietn is heme negative on recheck . 20 diarrhea-c diff negative.  patient related intermittant loose stools at baseline.   Plan 1) recommend UGIS/SBFT when clinically feasible as long as contrast will not interfere with cath.  continue bid ppi   Electronic Signatures: Loistine Simas (MD)  (Signed 26-May-14 11:27)  Authored: Chief Complaint, VITAL SIGNS/ANCILLARY NOTES, Brief Assessment, Lab Results, Assessment/Plan   Last Updated: 26-May-14 11:27 by Loistine Simas (MD)

## 2014-08-22 NOTE — Consult Note (Signed)
PATIENT NAME:  [name], [name] MR#:  [id number] DATE OF BIRTH:  [birth year]  DATE OF CONSULTATION:  09/23/2012  CONSULTING PHYSICIAN:  Lollie Sails, MD  REASON FOR CONSULTATION:  Possible GI evaluation in ;setting of iron deficiency anemia and upcoming cath for NSTEMI.   HISTORY OF PRESENT ILLNESS:  The patient is a pleasant 69 year old Caucasian female who was admitted on 09/21/2012. She presented to the hospital with cough and shortness of breath. Evaluation at that time found her to have elevated troponins and subsequent evaluation consistent with non-ST segment elevated MI. She has had problems with coughing for at least several weeks. She was started on an antibiotic about 5 days ago at her primary doctor, and stated that it seemed to make her no better. She subsequently went to the urgent care, was found to have lab abnormalities, further sent to the Emergency Room, and then evaluated and admitted, as above. She does take methotrexate, as well as Enbrel for rheumatoid problems. She denies any problems with nausea, vomiting or abdominal pain. She does get heartburn with increasing symptoms recently of dysphagia. She was just recently, within the past month to 2 months, evaluated via EGD at [facility]. At that time, they found her to have esophageal candidiasis and had placed her on a prolonged treatment of 21 days for this problem. She has about a week to go on that treatment. She, in reviewing back through several EGDs that have been done since January 2012, there is a finding of candidiasis on 1 or 2 of these as well. There is also a history of multiple esophageal dilatations, apparently empirically, within the past several years as well. She was seen again at [facility] by Dr. Hilarie Fredrickson, who did the endoscopy. She also had a colonoscopy done for screening purposes on 05/11/2009, with a finding of internal hemorrhoids, negative otherwise. She denies any black stools, blood in the stools or slimy stools.    GASTROINTESTINAL FAMILY HISTORY: Negative for colorectal cancer, liver disease or ulcers. The patient, herself, denies having ulcers in the past.   PAST MEDICAL HISTORY:  1.  History of hypertension.  2.  Insulin-dependent diabetes, for which she is actually on an insulin pump.  3.  Hyperlipidemia.  4.  Chronic low back pain secondary to herniated disk.  5.  Rheumatoid arthritis.  6.  Gastroesophageal reflux.  7.  Dysphagia, as outlined above.  8.  History of pancytopenia.  9.  History of multiple esophageal dilatations with history of esophageal candidiasis, likely related with diabetes.   SOCIAL HISTORY: The patient is a nonsmoker. Does not use alcohol.    MEDICATIONS: Include the following:  Outpatient, she takes:  1.  Amlodipine 10 mg once a day.  2.  Aspirin 81 mg daily.  3.  Atorvastatin 20 mg once a day.  4.  Centrum Silver.  5.  Enbrel 1 subcutaneous once a week.  6.  Folic acid 1 tablet daily.  7.  Lasix 40 mg 1 twice a day. 8.  Humalog insulin, per insulin pump use. 9.  Ibuprofen 800 mg twice a day.  10.  Losartan 100 mg once a day.  11.  Methotrexate 2.5 mg 4 tablets once a week.  12.  Metoprolol tartrate 100 mg twice a day.  13.  Pantoprazole 40 mg once a day. (Patient does relate taking this twice a day recently. I am unsure currently as to which would be valid).  14.  TUMS p.r.n.  15.  Vitamin B6.  16.  Vitamin D3.   ALLERGIES:  CODEINE.   REVIEW OF SYSTEMS:  Per admission history and physical, agree with same.   PHYSICAL EXAMINATION: VITAL SIGNS: Temperature is 99.4, pulse 88, respirations 20, blood pressure 120/66, pulse ox  94%.  GENERAL: She is a 69 year old Caucasian female. She is coughing, and it is difficult for her to talk at times with this cough, as noted above.  HEENT: Normocephalic, atraumatic.  Eyes: Anicteric. Nose: Septum midline. No lesions.  Oropharynx: No lesions.  NECK: Supple. No JVD. No lymphadenopathy. No thyromegaly.  HEART: Regular  rate and rhythm.  LUNGS: Clear.  ABDOMEN: Soft, nontender, nondistended. Bowel sounds positive, normoactive.  RECTAL: Anorectal exam is deferred.  EXTREMITIES: No clubbing, cyanosis or edema.  NEUROLOGICAL: Cranial nerves II through XII grossly intact. Muscle strength bilaterally equal and symmetric, 5/5. DTRs bilaterally equal and symmetric.   LABORATORY DATA: Include the following:  She has had a glucose of 111 this morning. BUN 18, creatinine 0.77, sodium 137, potassium 3.9, chloride 107, bicarb 23, calcium 8.5. Magnesium 1.9. Hemogram showing a white count of 6.4, H and H 9.4/27.1, platelet count 226. She has had a Hemoccult negative. She has serum iron of 14. BNP is 8236. Multiple troponin I are done. Initially trending as follows: 2.4, 2.05, 12.0, 12.0. B12 above normal limits.   RADIOLOGIC DATA:  She had a PA and lateral chest film showing some bilateral lower lobe opacities, possibly pulmonary edema or infection. New bilateral pulmonary effusions. Followup chest radiography, as well as possible CT, if worsening, recommended.   ASSESSMENT:   Iron deficiency anemia. It is of note that patient currently is taking ibuprofen in relatively high doses on occasions. She has a history of rheumatoid arthritis, takes this medication, as well as other specific medications for that problem. Further, it is of note that the patient has had multiple EGDs over the past 2 years with several esophageal dilatations with only finding of esophageal candidiasis. Most recently, an EGD was done within the past 3 to 4 weeks per patient, with no other findings. She also had a colonoscopy in 2011 with findings only of internal hemorrhoids.   RECOMMENDATION:  With the above findings on the recent EGD evaluations, and the current clinical situation of NSTEMI, would not advise pursuing sedated luminal evaluation currently. However, I would recommend further evaluation via upper GI/small bowel series to complete the previous  evaluation, in regards to iron deficiency. Would continue her on a PPI and consider eliminating any NSAID use. We will follow with you.    ____________________________ Lollie Sails, MD mus:dmm D: 09/24/2012 11:42:34 ET T: 09/24/2012 12:07:47 ET JOB#: [id number]  cc: Lollie Sails, MD, <Dictator> Lollie Sails MD ELECTRONICALLY SIGNED 10/02/2012 15:15

## 2014-08-22 NOTE — H&P (Signed)
PATIENT NAME:  [name], [name] MR#:  [id number] DATE OF BIRTH:  19-Jun-1945  DATE OF ADMISSION:  09/21/2012  ADDNEDUM:    ADMITTING PHYSICIAN: Gladstone Lighter, M.D.   PRIMARY CARE PHYSICIAN: Ronette Deter, MD  THE PATIENT'S HOME MEDICATIONS ARE AS FOLLOWS:  1.  Norvasc 10 mg p.o. daily.  2.  Aspirin 81 mg p.o. daily.  3.  Atorvastatin 20 mg p.o. daily.  4.  Centrum Silver 1 tablet p.o. daily.  5.  Enbrel subcutaneously once a week on Friday.  6.  Folic acid 1 mg p.o. daily.  7.  Furosemide 40 mg p.o. b.i.d.  8.  Humalog insulin pump.  9. Avapro 800 mg p.o. b.i.d. p.r.n.  10.  Losartan 100 mg p.o. daily.  11.  Mega Red 3krill oil 300 milligrams capsule daily.  12.  Methotrexate 10 mg once a week on Wednesday.  13.  Metoprolol 100 mg p.o. b.i.d.  14.  Protonix 40 mg p.o. daily.  15.  Reclast  5 mg per 100 ml IV yearly.  16.  TUMS 500 mg 4 times a day as needed.  17.  Vitamin B6 100 mg p.o. daily.  18.  Vitamin D3 5000 international units capsule p.o. daily    ____________________________ Gladstone Lighter, MD rk:cc D: 09/21/2012 19:34:48 ET T: 09/21/2012 20:31:14 ET JOB#: [id number]  cc: Gladstone Lighter, MD, <Dictator> Gladstone Lighter MD ELECTRONICALLY SIGNED 09/25/2012 14:53

## 2014-08-22 NOTE — Consult Note (Signed)
Allergies:  Codeine: GI Distress  Assessment/Plan:  Assessment/Plan 69 yo F with a long h/o type 1 diabetes admitted for pneumonia and NSTEMI. Diabetes is managed by insulin pump. Settings were reviewed. Sugars are elevated and in the 290-329 range in last 24 hours. Confirmed she is properly using the insulin pump. Eating well. No recent weight loss.  A/ Uncontrolled type 1 diabetes  P/ Continue with insulin pump. Adjusted basal rates up about 10%. Asked her to cahnge pump site and refill pump today. Asked her to check own BG ad lib with her glucometer. Will follow along with you. Full consult to be dictated.   Electronic Signatures: Judi Cong (MD)  (Signed 27-May-14 17:13)  AuthoredFabienne Bruns, Assessment/Plan   Last Updated: 27-May-14 17:13 by Judi Cong (MD)

## 2014-08-22 NOTE — Consult Note (Signed)
Chief Complaint:  Subjective/Chief Complaint seen for possible gi evaluation   VITAL SIGNS/ANCILLARY NOTES: **Vital Signs.:   27-May-14 15:59  Vital Signs Type Routine  Temperature Temperature (F) 98.7  Celsius 37  Temperature Source oral  Pulse Pulse 79  Respirations Respirations 20  Systolic BP Systolic BP 165  Diastolic BP (mmHg) Diastolic BP (mmHg) 69  Mean BP 89  Pulse Ox % Pulse Ox % 93  Pulse Ox Activity Level  At rest  Oxygen Delivery 2L   Brief Assessment:  Cardiac Regular   Respiratory coarse rhonchi, occasional wheeze bilateral   Gastrointestinal details normal Soft  Nontender  Nondistended  Bowel sounds normal   Lab Results: Routine Sero:  25-May-14 15:51   Occult Blood, Feces NEGATIVE (Result(s) reported on 23 Sep 2012 at 04:40PM.)  Routine Hem:  27-May-14 04:51   WBC (CBC) 5.7  RBC (CBC)  3.01  Hemoglobin (CBC)  9.5  Hematocrit (CBC)  27.3  Platelet Count (CBC) 256  MCV 91  MCH 31.5  MCHC 34.7  RDW 14.5  Neutrophil % 64.6  Lymphocyte % 17.6  Monocyte % 14.7  Eosinophil % 2.2  Basophil % 0.9  Neutrophil # 3.7  Lymphocyte # 1.0  Monocyte # 0.8  Eosinophil # 0.1  Basophil # 0.1 (Result(s) reported on 25 Sep 2012 at 06:00AM.)   Assessment/Plan:  Assessment/Plan:  Assessment 1) IDA-uncertain etiology, please see prior note in regard to recent GI evaluation.  recommend UGIS/SBS when clinically feasible, iron supplement. 2) recent diagnosis of fungal esophagitis on EGD at [facility] of tx interrupted by this admission.  patietn to find out what med she was on and I recommend continuingwhile in the hospital-history of IDDM, recent abx, immune suppressant meds.  3) h/o taking immune suppressant drugs for RA-effect on prolonged bronchitis?, fungal esophagitis   Plan 1) as noted above.   Electronic Signatures: Loistine Simas (MD)  (Signed 27-May-14 17:35)  Authored: Chief Complaint, VITAL SIGNS/ANCILLARY NOTES, Brief Assessment, Lab Results,  Assessment/Plan   Last Updated: 27-May-14 17:35 by Loistine Simas (MD)

## 2014-08-22 NOTE — Consult Note (Signed)
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 Cardiology was consulted for NSTEMI, troponin 2, SOB.  she has been having upper respiratory tract infection symptoms for almost 11 days.  PCP's nurse practitioner about 3 days ago started on Z-Pak. She has not felt much relief. Continues to spike fevers and having chills at home associated with some difficulty breathing. She has been having some productive cough. She denies any chest pain. Severe cough two nights ago, up all night.  Her sugars have been running in the 400 range for the last couple of days so came to the ER for further management. In the ER, her sugar was greater than 400 and she had an elevated troponin of 2.4. admitted for non-STEMI, hyperglycemia and also positive pneumonia.   Physical Exam:  GEN well developed, well nourished, no acute distress   HEENT hearing intact to voice, moist oral mucosa   NECK supple  No masses   RESP postive use of accessory muscles  rhonchi  at the right base   CARD Regular rate and rhythm  No murmur   ABD denies tenderness  soft   LYMPH negative neck   EXTR negative edema   SKIN normal to palpation   NEURO motor/sensory function intact   PSYCH alert, A+O to time, place, person, good insight   Review of Systems:  Subjective/Chief Complaint SOB, cough, malaise   General: Weight loss or gain  Fatigue  Fever/chills  Weakness  Trouble sleeping   Skin: No Complaints   ENT: No Complaints   Eyes: No Complaints   Neck: No Complaints   Respiratory: Frequent cough  Short of breath   Cardiovascular: Dyspnea   Gastrointestinal: No Complaints   Genitourinary: No Complaints   Vascular: No Complaints   Musculoskeletal: No Complaints   Neurologic: No Complaints    Hematologic: No Complaints   Endocrine: No Complaints   Psychiatric: No Complaints   Review of Systems: All other systems were reviewed and found to be negative   Medications/Allergies Reviewed Medications/Allergies reviewed     cataracts:    GERD:    High Cholesterol:    Rheumatoid Arthritis:    HTN:    DM:        Admit Diagnosis:   PNEUMONIA NSTEMI: Onset Date: 22-Sep-2012, Status: Active, Description: PNEUMONIA NSTEMI      Admit Reason:   Non-ST elevation myocardial infarction (NSTEMI) (410.70): Status: Active, Coding System: ICD9, Coded Name: Acute myocardial infarction, subendocardial infarction, episode of care unspecified  Home Medications: Medication Instructions Status  amLODIPine 10 mg tablet 1 tab(s) orally once a day  Active  losartan 100 mg tablet 1 tab(s) orally once a day  Active  furosemide 40 mg tablet 1 tab(s) orally 2 times a day  Active  ibuprofen 800 mg tablet 1 tab(s) orally 2 times a day or as needed. Active  folic acid 1 mg tablet 1 tab(s) orally once a day  Active  Tums 500 mg tablet, chewable 1 tab(s) orally 4 times a day as needed. Active  Centrum Silver Therapeutic Multiple Vitamins with Minerals tablet 1 tab(s) orally once a day  Active  HumaLOG 100 units/mL subcutaneous solution  insulin pump-use as directed  Active  Metoprolol Tartrate 100 mg oral tablet 1 tab(s) orally 2 times a day Active  methotrexate 2.5 mg oral tablet 4  tabs (71m) orally once a week on Wednesday. Active  atorvastatin 20 mg oral tablet 1 tab(s) orally once a day Active  pantoprazole 40 mg oral delayed release tablet 1 tab(s) orally once a day Active  MegaRed 3 krill oil 3056mcapsule 1 cap(s) orally once a day Active  Aspirin Enteric Coated 81 mg oral delayed release tablet 1 tab(s) orally once a day with evening meal. Active  Vitamin B6 100 mg oral tablet 1 tab(s) orally once a day Active  Vitamin D3 5000 intl units oral capsule 1 cap(s) orally once a day Active   Reclast 5 mg/100 mL intravenous solution 100 milliliters (91m22mintravenous yearly. Active  Enbrel 1  subcutaneous once a week on Friday. Active   Lab Results: Routine Chem:  24-May-14 03:55   Result Comment TROPONIN - RESULTS VERIFIED BY REPEAT TESTING.  - ELEVATED TROPONIN PREVIOUSLY CALLED AT  - 1650 09/21/12.PMH  Result(s) reported on 24 May 2016283 04:66:29UTFolic Acid, Serum 19.65.4esult(s) reported on 22 Sep 2012 at 10:50AM.)  Iron, Serum  14 (Result(s) reported on 22 Sep 2012 at 10:33AM.)  Ferritin ([facility] (Result(s) reported on 22 Sep 2012 at 10:50AM.)  Glucose, Serum  104  BUN  19  Creatinine (comp) 0.89  Sodium, Serum 138  Potassium, Serum  3.1  Chloride, Serum 106  CO2, Serum 24  Calcium (Total), Serum  8.3  Anion Gap 8  Osmolality (calc) 278  eGFR (African American) >60  eGFR (Non-African American) >60 (eGFR values <24m22mn/1.73 m2 may be an indication of chronic kidney disease (CKD). Calculated eGFR is useful in patients with stable renal function. The eGFR calculation will not be reliable in acutely ill patients when serum creatinine is changing rapidly. It is not useful in  patients on dialysis. The eGFR calculation may not be applicable to patients at the low and high extremes of body sizes, pregnant women, and vegetarians.)  Magnesium, Serum  1.7 (1.8-2.4 THERAPEUTIC RANGE: 4-7 mg/dL TOXIC: > 10 mg/dL  -----------------------)  Hemoglobin A1c ([facility])  8.9 (The American Diabetes Association recommends that a primary goal of therapy should be <7% and that physicians should reevaluate the treatment regimen in patients with HbA1c values consistently >8%.)  Cholesterol, Serum 142  Triglycerides, Serum 65  HDL (INHOUSE) 54  VLDL Cholesterol Calculated 13  LDL Cholesterol Calculated 75 (Result(s) reported on 22 Sep 2012 at 04:26AM.)  Cardiac:  24-May-14 03:55   Troponin I  12.00 (0.00-0.05 0.05 ng/mL or less: NEGATIVE  Repeat testing in 3-6 hrs  if  clinically indicated. >0.05 ng/mL: POTENTIAL  MYOCARDIAL INJURY. Repeat  testing in 3-6 hrs if  clinically indicated. NOTE: An increase or decrease  of 30% or more on serial  testing suggests a  clinically important change)  CK, Total  327  CPK-MB, Serum  14.1 (Result(s) reported on 22 Sep 2012 at 04:50AM.)  Routine Hem:  24-May-14 03:55   WBC (CBC) 9.3  RBC (CBC)  3.00  Hemoglobin (CBC)  9.5  Hematocrit (CBC)  26.7  Platelet Count (CBC) 217  MCV 89  MCH 31.8  MCHC 35.7  RDW 14.1  Neutrophil % 85.4  Lymphocyte % 6.2  Monocyte % 7.9  Eosinophil % 0.0  Basophil % 0.5  Neutrophil #  8.0  Lymphocyte #  0.6  Monocyte # 0.7  Eosinophil # 0.0  Basophil # 0.0 (Result(s) reported on 22 Sep 2012 at 04:27AM.)   EKG:  Interpretation EKG shows NSR with ST ABN in anterolateral leads  V4  to V6, I and AVL   Radiology Results: XRay:    24-May-14 08:50, Chest PA and Lateral  Chest PA and Lateral   REASON FOR EXAM:    follow up CHF/pneumonia  COMMENTS:       PROCEDURE: DXR - DXR CHEST PA (OR AP) AND LATERAL  - Sep 22 2012  8:50AM     RESULT: Comparison: 09/21/2012    Findings:  Heart size upper limits normal, similar to prior. There are heterogeneous   opacities in the lung bases which are slightly increased from prior.   There are small bilateral pleural effusions. There is a slightly more   focal round opacity in the left midlung which is similar to prior.    IMPRESSION:   1. Slight interval increase in bilateral lower lobe opacities. This could     be secondary to pulmonary edema or infection.   2. New small bilateral pleural effusions.  3. There is a slightly more focal round opacity in the left midlung which   is similar to recent prior. This could be related to the aforementioned   opacities. However, followup chest radiograph is recommended to ensure   resolution. If this finding were to persist, further evaluation with   chest CT would be  recommended.        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 Cardiology was consulted for NSTEMI, troponin 2, SOB.  1) Pneumonia right base by clinical exam on broad ABX already had zpak 5 days  immunosuppression, on embrel (held) and MTX  2)NSTEMI: long hx of diabetes, on immunosuppression troponin up to 12 this AM, Significant EKG abn in anterolateral leads on arrival on lovenox BID, no sx apart from cough and mild SOB (from pneuomina) --echo pending --cath lab currently closed. Will need cath asap (uncertain if open on Monday memorial day) --Could recheck HCT this afternoon. If additional drop, may have to hold lovenox --If she has worsening sx of chest tightness, worsening SOB, felt secondary to angina/ischemia, could transfer to [facility] for cath lab.   3) Diabetes: on insulin Medical service managing  4) rheumatoid arthritis on MTX embrel held for now in setting of pneumonia  5) Anemia Could recheck HCT later today If another drop in level,  may have to hold lovenox (in that case, consider GI source)   Electronic Signatures: Ida Rogue (MD)  (Signed 24-May-14 11:57)  Authored: General Aspect/Present Illness, History and Physical Exam, Review of System, Past Medical History, Health Issues, Home Medications, Labs, EKG , Radiology, Allergies, Vital Signs/Nurse's Notes, Impression/Plan   Last  Updated: 24-May-14 11:57 by Ida Rogue (MD)

## 2014-08-22 NOTE — Consult Note (Signed)
PATIENT NAME:  [name], SPIZZIRRI MR#:  [id number] DATE OF BIRTH:  1946/03/03  DATE OF CONSULTATION:  09/25/2012  REFERRING PHYSICIAN:  Dustin Flock, MD CONSULTING PHYSICIAN:  A. Lavone Orn, MD  CHIEF COMPLAINT: Uncontrolled diabetes.   HISTORY OF PRESENT ILLNESS: This is a 69 year old female seen in consultation for uncontrolled diabetes. She was hospitalized on May 23 for hyperglycemia, pneumonia and also non-ST elevated myocardial infarction. She has been doing fairly well and her clinical course does seem to be improving. Unfortunately blood sugars have been challenging to control. She has been maintained on her Medtr[employer] insulin pump. Over the last 24 hours, blood sugars have been in the 293 to 329 range. Her insulin pump settings are as follows: Basal rate 12:00 a.m. 0.2 units an hour, 6:00 a.m. 0.6 units an hour, 12:00 p.m. 0.5 units an hour, 3:00 p.m. 0.275 units an hour, 6:00 p.m. 0.4 units an hour for a total daily basal rate of 9.525 units. For meals, she boluses based an insulin to carbohydrate ratio of 1:10, and corrects blood sugars 1 unit per every 50 over a blood glucose target of 180. I confirmed she has been following these bolus settings. In the last 24 hours, she bolused 4 times, before meals and at bedtime. She received about 22 units of insulin by bolus only. She has Humalog insulin in her pump.   The patient was diagnosed with diabetes 56 years ago. She has been on an insulin pump for 10 years. She is not aware of any known complications from her diabetes. She was following with an endocrinologist in [place] up until 4 years ago. She relocated locally and has not yet established with endocrinology. She acknowledges good knowledge about diabetes and confidence and self-management of her insulin pump. She is comfortable changing basal rates as needed. She is interested in establishing with endocrinology.   PAST MEDICAL HISTORY: 1.  Type 1 diabetes.  2.  Hypertension.  3.   Hyperlipidemia.  4.  Rheumatoid arthritis.  5.  GERD.    PAST SURGICAL HISTORY:  Esophageal dilation.   ALLERGIES: CODEINE.   SOCIAL HISTORY: The patient lives with her family. She denies tobacco or alcohol use.   FAMILY HISTORY: Positive for heart disease. One sister with a brain aneurysm and another sister with bone cancer.   CURRENT MEDICATIONS: 1.  Humalog via insulin pump.  2.  Mucinex 600 mg b.i.d.  3.  Levaquin 500 mg daily.  4.  Magnesium oxide 400 mg b.i.d.  5.  Lopressor 100 mg b.i.d.  6.  Pantoprazole 40 mg daily.  7.  Aspirin 81 mg daily.  8.  Zocor 40 mg at bedtime.  9.  Lovenox 40 mg daily.  10.  Lasix 20 mg every 12 hours.   REVIEW OF SYSTEMS: GENERAL: No weight loss. No fever.  HEENT: No blurred vision. No sore throat.  NECK: No neck pain. No dysphasia.  CARDIAC: Denies chest pain. Denies palpitations.  PULMONARY: She does report cough. She does report shortness of breath.  ABDOMEN: Appetite is good. She denies nausea.  EXTREMITIES: Does report leg swelling, the right greater than left. Denies focal weakness, denies heat or cold intolerance.  GENITOURINARY: Denies hematuria or dysuria.  GI: Reports some diarrhea earlier today. Denies abdominal pain.   PHYSICAL EXAMINATION: VITAL SIGNS: Height 63.9 inches, weight 161 pounds, BMI 27.7. Temperature 98.7, pulse 79, respirations 20, blood pressure 129/69, pulse oximetry 93% on 2 liters O2 by nasal pain. No.  GENERAL: Overweight white female in  no acute distress.  HEENT: Extraocular movements are intact. Oropharynx is clear. Mucous membranes moist.  NECK: Supple. No thyromegaly. No neck tenderness.  LYMPH: No submandibular anterior cervical lymphadenopathy.  CARDIAC: Regular rate and rhythm. Systolic murmur is present. No carotid bruits. PULMONARY: Rhonchi are present at the bases, no wheeze. Good inspiratory effort.  ABDOMEN: Diffusely soft, nontender, nondistended.  EXTREMITIES: Trace edema right ankle,  otherwise not present.  PSYCHIATRIC: Alert and oriented, calm, cooperative.  SKIN: No rash or dermatopathy is present.   LABORATORY DATA: May 26: Glucose 278, BUN 15, creatinine 0.68, sodium 137, potassium 4.9, EGFR greater than 60, calcium 8.6, hematocrit 27.8%, WBC 5.7, platelets 247. May 24:  Hemoglobin A1c 8.9%. May  23: TSH 1.43.  ASSESSMENT: This is a 69 year old female with long-standing type 1diabetes, currently uncontrolled, in the setting of pneumonia and recent non-ST elevated myocardial infarction.   RECOMMENDATIONS: 1.  Continue use of insulin pump.  2.  Adjusted pump setting slightly to give it mildly increased basal rate during her waking hours only. Adjusted her basal rates up after 6 AM by 0.05 units for a 24-hour total basal of 11.275. Bolus insulin dosages will not be adjusted.  3.  I also recommended she check her blood sugar with her own glucometer more often. She is welcome to check between meals and give extra insulin per insulin pump, if needed to correct highs.  4.  Nursing to continue monitoring blood sugars before meals and at bedtime. Additionally, we will add a 2:00 a.m. blood glucose. If it is elevated at that time, I can go ahead and adjust her 12:00 a.m. basal rate as well.   Thank you for the kind request for consultation. I will follow along with you.   ____________________________ A. Lavone Orn, MD ams:cc D: 09/25/2012 17:22:00 ET T: 09/25/2012 18:32:55 ET JOB#: [id number]  cc: A. Lavone Orn, MD, <Dictator> Sherlon Handing MD ELECTRONICALLY SIGNED 10/01/2012 16:56

## 2014-08-22 NOTE — Consult Note (Signed)
Brief Consult Note: Diagnosis: Iron deficiency anemia, possibility of need for stent related anticoagulation.   Patient was seen by consultant.   Recommend further assessment or treatment.   Comments: Patient seen and examined, chart reviewed.  Full consult to follow. Patient admitted with cough, results of evauation showing NSTEMI.  Patinet to soon have cardiac cath, possible need for stent-related anticoagulation.  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   Her last colonoscopy was 05/11/2009 negative except for internal hemorrhoids.   Any further evaluation at this time for IDA would entail a small bowel series +/- video capsule endoscopy.  In the current clinical situation the sbs contrast could be problematic for fluoroscopy/cath, and sedated luminal evaluation would be considered high risk bothe from the respiratory aspect due to the persistant bronchitis and a cardiac status.    I would continue the PPI, she is taking protonix once daily, and finish course of antifungal.  Would do h.pylori stool antigen.  Will follow with you.  Electronic Signatures for Addendum Section:  Loistine Simas (MD) (Signed Addendum 26-May-14 11:43)  please see full Gi consult [phone].   Electronic Signatures: Loistine Simas (MD)  (Signed [phone] 21:49)  Authored: Brief Consult Note   Last Updated: 26-May-14 11:43 by Loistine Simas (MD)

## 2014-08-22 NOTE — Discharge Summary (Signed)
PATIENT NAME:  [name], [name] MR#:  [id number] DATE OF BIRTH:  08-18-1945  DATE OF ADMISSION:  09/21/2012 DATE OF DISCHARGE:  09/27/2012  ADMITTING DIAGNOSES: Shortness of breath and cough.   DISCHARGE DIAGNOSES:  1. Acute respiratory failure due to a combination of acute diastolic congestive heart failure as well as possible atypical pneumonia.  2. Non-ST myocardial infarction with a troponin as high as 20. Her echocardiogram does not show any significant wall motion abnormalities. Due to the patient unable to lie completely flat, for now Dr. Rockey Situ and the patient decided to have the cardiac catheterization at a later point.  3. Diarrhea noted during hospitalization, now resolved. Stool for Clostridium difficile negative.  4. Hypokalemia, replaced.  5. Diabetes. She is on an insulin pump. She was poorly controlled. Seen by endocrinology.  6. Hyperlipidemia.  7. Anemia, iron deficient. Had recent esophagogastroduodenoscopy and a colonoscopy which were negative. The patient started on iron.  8. Herniated disk with chronic back pain.  9. Rheumatoid arthritis.  10. Gastroesophageal reflux disease.  11. Intermittent dysphagia with a few esophageal dilations in the past.  12. History of pancytopenia in the past.   CONSULTANTS: Dr. Rockey Situ and Dr. Gustavo Lah.   PERTINENT LABS AND EVALUATIONS: Troponin on admission was 2.40. Her troponin subsequently went up to 12. EKG showed normal sinus rhythm without any ST-T wave changes. Urinalysis was negative. BMP: Glucose was 430, BUN 16, creatinine 0.91, sodium 131, potassium 3.7, chloride 99, CO2 was 21, calcium 9.2. LFTs were normal except albumin of 3.2. PA and lateral chest x-ray showed bilateral pulmonary infiltrates. BNP was 8236. TSH 1.43. Echocardiogram of the heart showed a normal EF 55% to 60%, normal right ventricular size and systolic function, mildly dilated left atrium, moderately elevated pulmonary artery systolic pressures.   HOSPITAL COURSE:   1. Please refer to H and P done by the admitting physician. The patient is a 69 year old white female who presented with shortness of breath and cough which started about 11 days ago. The patient was seen by primary care provider and was treated with Z-Pak. She presented with these symptoms to the ED, and they checked her troponin which was high as well. The patient was admitted for possible pneumonia as well as a non-ST MI. In terms of her respiratory failure, the patient had respiratory failure with shortness of breath and cough. She was started on IV antibiotics. She was also started on Lasix. With these treatments, her respiratory status has improved. She is off oxygen. She still has some cough. Based on the echo findings of pulmonary hypertension, I recommend the patient be seen by pulmonary as an outpatient. Her primary care provider can arrange this.  2. Non-ST MI. The patient was seen by cardiology. Due to the fact that she was not able to lie flat with coughing, it was decided to have the cardiac cath in the near future once her pulmonary issues resolve.  3. Diarrhea. The patient developed diarrhea during hospitalization. Her stools were negative for C. diff.  4. Diabetes which was very poorly controlled with blood sugars in the 300s. She was seen by endocrinology. She is on an insulin pump which is continued.   DISPOSITION: At this time, the patient is doing much better and is stable for discharge with continued outpatient treatment. Discharge instructions for congestive heart failure given.   DISCHARGE MEDICATIONS: Losartan 100 one tab p.o. daily, Lasix 40 one tab p.o. b.i.d., folic acid 1 mg daily, Centrum Silver 1 tab p.o. daily,  Humalog insulin pump use as directed, metoprolol tartrate 100 one tab p.o. b.i.d., methotrexate 10 mg once a week, atorvastatin 10 daily, Protonix 40 daily, aspirin 81 one tab p.o. daily, vitamin B6 100 daily, vitamin D3 5000 international units daily, Reclast 100 mL  yearly IV, Enbrel subcutaneous on Wednesday, Tylenol 650 q.4 p.r.n., guaifenesin 600 one tab p.o. b.i.d., iron polysaccharide 1 tab p.o. b.i.d. 150 mg, levofloxacin 500 one tab p.o. daily x5 days, dextromethorphan and guaifenesin 10 mL q.6 p.r.n., Nitrostat 0.4 sublingual p.r.n.   DIET: Low-sodium, low-fat, low-cholesterol, ADA diet.   ACTIVITY: As tolerated.   FOLLOWUP: Follow with Dr. Rockey Situ in 1 to 2 weeks. Follow with primary MD in 1 to 2 weeks.   TIME SPENT: 35 minutes spent on the discharge.   ____________________________ Lafonda Mosses. Posey Pronto, MD shp:gb D: 09/27/2012 14:22:17 ET T: 09/27/2012 21:33:25 ET JOB#: [id number]  cc: Zackariah Vanderpol H. Posey Pronto, MD, <Dictator> Alric Seton MD ELECTRONICALLY SIGNED 10/06/2012 8:19

## 2014-10-08 ENCOUNTER — Other Ambulatory Visit: Payer: Self-pay | Admitting: Internal Medicine

## 2014-10-08 DIAGNOSIS — K219 Gastro-esophageal reflux disease without esophagitis: Secondary | ICD-10-CM

## 2014-10-08 DIAGNOSIS — R1314 Dysphagia, pharyngoesophageal phase: Secondary | ICD-10-CM

## 2014-10-08 DIAGNOSIS — Z8719 Personal history of other diseases of the digestive system: Secondary | ICD-10-CM

## 2014-10-08 MED ORDER — RANITIDINE HCL 150 MG PO TABS: 150.0000 mg | ORAL_TABLET | Freq: Two times a day (BID) | ORAL | Status: DC

## 2014-10-08 NOTE — Telephone Encounter (Signed)
Refill has been sent pt aware

## 2014-10-17 LAB — HEMOGLOBIN A1C: HEMOGLOBIN A1C: 7.1 % — AB (ref 4.0–6.0)

## 2014-11-13 ENCOUNTER — Encounter: Payer: Self-pay | Admitting: Internal Medicine

## 2014-11-13 ENCOUNTER — Ambulatory Visit (INDEPENDENT_AMBULATORY_CARE_PROVIDER_SITE_OTHER): Payer: Medicare Other | Admitting: Internal Medicine

## 2014-11-13 VITALS — BP 133/62 | HR 67 | Temp 97.7°F | Ht 63.0 in | Wt 164.4 lb

## 2014-11-13 DIAGNOSIS — I2583 Coronary atherosclerosis due to lipid rich plaque: Secondary | ICD-10-CM

## 2014-11-13 DIAGNOSIS — K219 Gastro-esophageal reflux disease without esophagitis: Secondary | ICD-10-CM

## 2014-11-13 DIAGNOSIS — E109 Type 1 diabetes mellitus without complications: Secondary | ICD-10-CM | POA: Diagnosis not present

## 2014-11-13 DIAGNOSIS — I2510 Atherosclerotic heart disease of native coronary artery without angina pectoris: Secondary | ICD-10-CM | POA: Diagnosis not present

## 2014-11-13 DIAGNOSIS — R1314 Dysphagia, pharyngoesophageal phase: Secondary | ICD-10-CM

## 2014-11-13 DIAGNOSIS — E785 Hyperlipidemia, unspecified: Secondary | ICD-10-CM

## 2014-11-13 DIAGNOSIS — I10 Essential (primary) hypertension: Secondary | ICD-10-CM

## 2014-11-13 DIAGNOSIS — Z8719 Personal history of other diseases of the digestive system: Secondary | ICD-10-CM

## 2014-11-13 MED ORDER — AMLODIPINE BESYLATE 5 MG PO TABS: 5.0000 mg | ORAL_TABLET | Freq: Every day | ORAL | Status: DC

## 2014-11-13 MED ORDER — RANITIDINE HCL 150 MG PO TABS: 150.0000 mg | ORAL_TABLET | Freq: Two times a day (BID) | ORAL | Status: DC

## 2014-11-13 MED ORDER — METHOTREXATE 2.5 MG PO TABS: 10.0000 mg | ORAL_TABLET | ORAL | Status: DC

## 2014-11-13 NOTE — Patient Instructions (Addendum)
Fasting Labs on Tuesday July 19th.  Follow up in 6 months.

## 2014-11-13 NOTE — Assessment & Plan Note (Addendum)
BP Readings from Last 3 Encounters:  11/13/14 133/62  08/19/14 140/56  08/11/14 133/48   BP well controlled. Renal function with recent labs normal.

## 2014-11-13 NOTE — Progress Notes (Signed)
Pre visit review using our clinic review tool, if applicable. No additional management support is needed unless otherwise documented below in the visit note. 

## 2014-11-13 NOTE — Assessment & Plan Note (Signed)
Will check lipids with labs. Continue Atorvastatin. 

## 2014-11-13 NOTE — Assessment & Plan Note (Signed)
Recent blood sugars well controlled. Continue follow up with endocrinology. Continue Insulin pump.

## 2014-11-13 NOTE — Assessment & Plan Note (Signed)
Symptomatically, doing well. Continue current medications.

## 2014-11-13 NOTE — Progress Notes (Signed)
Subjective:    Patient ID: [name], female    DOB: [birth year], 69 y.o.   MRN: [id number]  HPI 69YO female presents for follow up.  DM - BG well controlled. Last A1c 7.1%. No recent low BG. Continues on Insulin pump.  CAD - no recent chest pain, fatigue. Compliant with medications. Dr. Rockey Situ recently changed Lipitor.  RA - Continues on Enbrel and Methotrexate.  Past medical, surgical, family and social history per today's encounter.  Review of Systems  Constitutional: Negative for fever, chills, appetite change, fatigue and unexpected weight change.  Eyes: Negative for visual disturbance.  Respiratory: Negative for shortness of breath.   Cardiovascular: Negative for chest pain and leg swelling.  Gastrointestinal: Negative for nausea, vomiting, abdominal pain, diarrhea and constipation.  Musculoskeletal: Negative for myalgias and arthralgias.  Skin: Negative for color change and rash.  Hematological: Negative for adenopathy. Does not bruise/bleed easily.  Psychiatric/Behavioral: Negative for suicidal ideas, sleep disturbance and dysphoric mood. The patient is not nervous/anxious.        Objective:    BP 133/62 mmHg  Pulse 67  Temp(Src) 97.7 F (36.5 C) (Oral)  Ht 5\' 3"  (1.6 m)  Wt 164 lb 6 oz (74.56 kg)  BMI 29.13 kg/m2  SpO2 97% Physical Exam  Constitutional: She is oriented to person, place, and time. She appears well-developed and well-nourished. No distress.  HENT:  Head: Normocephalic and atraumatic.  Right Ear: External ear normal.  Left Ear: External ear normal.  Nose: Nose normal.  Mouth/Throat: Oropharynx is clear and moist. No oropharyngeal exudate.  Eyes: Conjunctivae are normal. Pupils are equal, round, and reactive to light. Right eye exhibits no discharge. Left eye exhibits no discharge. No scleral icterus.  Neck: Normal range of motion. Neck supple. No tracheal deviation present. No thyromegaly present.  Cardiovascular: Normal rate, regular  rhythm, normal heart sounds and intact distal pulses.  Exam reveals no gallop and no friction rub.   No murmur heard. Pulmonary/Chest: Effort normal and breath sounds normal. No respiratory distress. She has no wheezes. She has no rales. She exhibits no tenderness.  Musculoskeletal: Normal range of motion. She exhibits no edema or tenderness.  Lymphadenopathy:    She has no cervical adenopathy.  Neurological: She is alert and oriented to person, place, and time. No cranial nerve deficit. She exhibits normal muscle tone. Coordination normal.  Skin: Skin is warm and dry. No rash noted. She is not diaphoretic. No erythema. No pallor.  Psychiatric: She has a normal mood and affect. Her behavior is normal. Judgment and thought content normal.          Assessment & Plan:   Problem List Items Addressed This Visit      Unprioritized   CAD (coronary artery disease)    Symptomatically, doing well. Continue current medications.      Relevant Medications   amLODipine (NORVASC) 5 MG tablet   Diabetes type 1, controlled - Primary    Recent blood sugars well controlled. Continue follow up with endocrinology. Continue Insulin pump.      Dysphagia, pharyngoesophageal phase   Relevant Medications   ranitidine (ZANTAC) 150 MG tablet   GERD (gastroesophageal reflux disease)    Symptoms well controlled with ranitidine. Will continue.      Relevant Medications   ranitidine (ZANTAC) 150 MG tablet   History of esophageal stricture   Relevant Medications   ranitidine (ZANTAC) 150 MG tablet   Hyperlipidemia    Will check lipids with labs. Continue Atorvastatin.  Relevant Medications   amLODipine (NORVASC) 5 MG tablet   Other Relevant Orders   Comprehensive metabolic panel   Lipid panel   Hypertension    BP Readings from Last 3 Encounters:  11/13/14 133/62  08/19/14 140/56  08/11/14 133/48   BP well controlled. Renal function with recent labs normal.      Relevant Medications    amLODipine (NORVASC) 5 MG tablet       Return in about 6 months (around 05/16/2015) for Wellness Visit.

## 2014-11-13 NOTE — Assessment & Plan Note (Signed)
Symptoms well controlled with ranitidine. Will continue.

## 2014-11-17 ENCOUNTER — Ambulatory Visit: Payer: Commercial Managed Care - HMO

## 2014-11-18 ENCOUNTER — Other Ambulatory Visit (INDEPENDENT_AMBULATORY_CARE_PROVIDER_SITE_OTHER): Payer: Medicare Other

## 2014-11-18 ENCOUNTER — Other Ambulatory Visit: Payer: Self-pay | Admitting: *Deleted

## 2014-11-18 DIAGNOSIS — E109 Type 1 diabetes mellitus without complications: Secondary | ICD-10-CM

## 2014-11-18 DIAGNOSIS — E785 Hyperlipidemia, unspecified: Secondary | ICD-10-CM

## 2014-11-18 LAB — COMPREHENSIVE METABOLIC PANEL
ALT: 18 U/L (ref 0–35)
AST: 27 U/L (ref 0–37)
Albumin: 3.7 g/dL (ref 3.5–5.2)
Alkaline Phosphatase: 50 U/L (ref 39–117)
BUN: 13 mg/dL (ref 6–23)
CALCIUM: 9 mg/dL (ref 8.4–10.5)
CO2: 25 meq/L (ref 19–32)
CREATININE: 0.7 mg/dL (ref 0.40–1.20)
Chloride: 102 mEq/L (ref 96–112)
GFR: 87.69 mL/min (ref >60.00)
GLUCOSE: 206 mg/dL — AB (ref 70–99)
Potassium: 4.4 mEq/L (ref 3.5–5.1)
SODIUM: 135 meq/L (ref 135–145)
Total Bilirubin: 0.6 mg/dL (ref 0.2–1.2)
Total Protein: 6.9 g/dL (ref 6.0–8.3)

## 2014-11-18 LAB — LIPID PANEL
Cholesterol: 184 mg/dL (ref 0–200)
HDL: 71.8 mg/dL (ref >39.00)
LDL Cholesterol: 99 mg/dL (ref 0–99)
NonHDL: 112.2
Total CHOL/HDL Ratio: 3
Triglycerides: 64 mg/dL (ref 0.0–149.0)
VLDL: 12.8 mg/dL (ref 0.0–40.0)

## 2014-11-18 MED ORDER — INSULIN LISPRO 100 UNIT/ML ~~LOC~~ SOLN: SUBCUTANEOUS | Status: DC

## 2015-02-10 ENCOUNTER — Encounter: Payer: Self-pay | Admitting: Cardiovascular Disease

## 2015-02-10 ENCOUNTER — Ambulatory Visit (INDEPENDENT_AMBULATORY_CARE_PROVIDER_SITE_OTHER): Payer: Medicare Other | Admitting: Cardiovascular Disease

## 2015-02-10 VITALS — BP 150/62 | HR 75 | Ht 63.0 in | Wt 166.8 lb

## 2015-02-10 DIAGNOSIS — I25810 Atherosclerosis of coronary artery bypass graft(s) without angina pectoris: Secondary | ICD-10-CM

## 2015-02-10 DIAGNOSIS — E1159 Type 2 diabetes mellitus with other circulatory complications: Secondary | ICD-10-CM

## 2015-02-10 DIAGNOSIS — E1059 Type 1 diabetes mellitus with other circulatory complications: Secondary | ICD-10-CM

## 2015-02-10 DIAGNOSIS — E785 Hyperlipidemia, unspecified: Secondary | ICD-10-CM | POA: Diagnosis not present

## 2015-02-10 DIAGNOSIS — I2510 Atherosclerotic heart disease of native coronary artery without angina pectoris: Secondary | ICD-10-CM | POA: Diagnosis not present

## 2015-02-10 DIAGNOSIS — I209 Angina pectoris, unspecified: Secondary | ICD-10-CM

## 2015-02-10 DIAGNOSIS — I10 Essential (primary) hypertension: Secondary | ICD-10-CM | POA: Diagnosis not present

## 2015-02-10 DIAGNOSIS — I2583 Coronary atherosclerosis due to lipid rich plaque: Principal | ICD-10-CM

## 2015-02-10 MED ORDER — EZETIMIBE 10 MG PO TABS: 10.0000 mg | ORAL_TABLET | Freq: Every day | ORAL | Status: DC

## 2015-02-10 NOTE — Progress Notes (Signed)
Patient ID: [name], female    DOB: [birth year], 69 y.o.   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 She had spiking fevers, chills, severe shortness of breath. Sugars were in the 400s. She had a long course of antibiotics to her hospital visit. Secondary to significant cough, she was unable to lie flat for cardiac catheterization. She declined cardiac catheterization on the same hospital visit. On admission to the hospital, her immunosuppressants, Enbrel and methotrexate were held She had cardiac catheterization in followup after her pneumonia resolved. This showed severe three-vessel CAD. She was transferred from [facility] to [place] for bypass surgery. She had four-vessel bypass surgery with LIMA to the LAD, saphenous vein graft to the diagonal, vein graft to the OM, vein graft to the PDA. She reports relatively uneventful surgery. She was admitted on June 19, discharge June 24.  She presents today for follow-up of her coronary disease, history of bypass  In follow-up, she reports that she is doing well. No regular exercise, sedentary at baseline. Husband reports that they need to restart their exercise program Denies any chest pain or shortness of breath concerning for angina Hemoglobin A1c down to 7.1 Tolerating Lipitor 80 mg daily. Total cholesterol continues to run 180, LDL above goal She is working with endocrinology with her sugars  EKG on today's visit shows normal sinus rhythm with rate 75 bpm, no significant ST or T-wave changes  Other past medical history  she has seen Precious Reel for her rheumatoid arthritis, on Enbrel and  methotrexate. She reports symptoms are stable   Echocardiogram 12/23/2012 showed normal ejection fraction with no wall motion abnormality, normal right ventricular size and function, moderately elevated right ventricular systolic pressures.  Cardiac catheterization showed 100% proximally occluded RCA, critical ostial LAD disease, severe mid LAD disease, severe mid left circumflex disease, ejection fraction estimated at 50-55% with mild hypokinesis of the inferior wall.  Allergies  Allergen Reactions  . Codeine     nausea    Outpatient Encounter Prescriptions as of 02/10/2015  Medication Sig  . amLODipine (NORVASC) 5 MG tablet Take 1 tablet (5 mg total) by mouth daily.  Marland Kitchen aspirin 81 MG tablet Take 81 mg by mouth daily.  Marland Kitchen atorvastatin (LIPITOR) 80 MG tablet Take 1 tablet (80 mg total) by mouth daily.  . Cholecalciferol (VITAMIN D) 2000 UNITS CAPS Take 1 capsule by mouth daily.  Marland Kitchen etanercept (ENBREL) 25 MG injection Inject 25 mg into the skin once a week.  . folic acid (FOLVITE) 1 MG tablet Take 1 tablet (1 mg total) by mouth daily.  . furosemide (LASIX) 40 MG tablet Take 1 tablet (40 mg total) by mouth 2 (two) times daily.  Marland Kitchen glucose blood test strip Use as directed. Use one strip q 3 hrs while awake  . ibuprofen (ADVIL,MOTRIN) 800 MG tablet Take 1 tablet (800 mg total) by mouth 3 (three) times daily as needed.  . insulin lispro (HUMALOG) 100 UNIT/ML injection As directed via Insulin pump  . loratadine (CLARITIN) 10 MG tablet Take 10 mg by mouth daily.  Marland Kitchen losartan (COZAAR) 100 MG tablet Take 1 tablet (100 mg total) by mouth daily.  Marland Kitchen  methotrexate (RHEUMATREX) 2.5 MG tablet Take 4 tablets (10 mg total) by mouth once a week. Takes every Wednesday.Caution:Chemotherapy. Protect from light.  . metoprolol tartrate (LOPRESSOR) 25 MG tablet Take 1 tablet (25 mg total) by mouth 2 (two) times daily as needed.  . Multiple Vitamin (MULTIVITAMIN) tablet Take 1 tablet by mouth daily.    . pantoprazole  (PROTONIX) 40 MG tablet Take 1 tablet (40 mg total) by mouth 2 (two) times daily. (Patient taking differently: Take 40 mg by mouth daily. )  . ranitidine (ZANTAC) 150 MG tablet Take 1 tablet (150 mg total) by mouth 2 (two) times daily.  Marland Kitchen saccharomyces boulardii (FLORASTOR) 250 MG capsule Take 250 mg by mouth as needed.  . zoledronic acid (RECLAST) 5 MG/100ML SOLN Inject 5 mg into the vein once.  . ezetimibe (ZETIA) 10 MG tablet Take 1 tablet (10 mg total) by mouth daily.   No facility-administered encounter medications on file as of 02/10/2015.    Past Medical History  Diagnosis Date  . Leukopenia 2012    s/p bone marrow biopsy, Dr. Ma Hillock  . Cholelithiasis   . GERD (gastroesophageal reflux disease)   . Hypertension   . Hyperlipidemia   . Esophageal stricture   . Chronic diastolic CHF (congestive heart failure) ([place])   . CAD (coronary artery disease)     a. 09/2012 Cath: LM nl, LAD 95p, 43m LCX 975mOM2 50, RCA 100.  . Marland Kitchenypokalemia   . Herniated disc   . Pneumonia 2013; 08/2012    "one lung; double" (10/18/2012)  . Exertional shortness of breath   . Type I diabetes mellitus ([place]    "dx'd in 1957" (10/18/2012)  . History of pancytopenia   . H/O hiatal hernia   . Rheumatoid arthritis(714.0)   . NSTEMI (non-ST elevated myocardial infarction) ([facility]5/2014    "mild" (10/18/2012)    Past Surgical History  Procedure Laterality Date  . Esophagogastroduodenoscopy  2012    Dr. IfMinna Merritts. Tubal ligation  1970  . Cataract extraction w/ intraocular lens implant Right 2010  . Cardiac catheterization  10/18/2012    "first one was today" (10/18/2012)  . Esophageal dilation      "3 or 4 times" (10/19/2011)  . Coronary artery bypass graft N/A 10/19/2012    Procedure: CORONARY ARTERY BYPASS GRAFTING (CABG);  Surgeon: StMelrose NakayamaMD;  [facility] Service: Open [facility] Surgery;  Laterality: N/A;    Social History  reports that she quit smoking about 20 years ago. Her smoking use  included Cigarettes. She has a 30 pack-year smoking history. She has never used smokeless tobacco. She reports that she does not drink alcohol or use illicit drugs.  Family History family history includes Arthritis in her father and mother; Bone cancer in her sister; Diabetes in her father and mother.   Review of Systems  Constitutional: Negative.   Respiratory: Negative.   Cardiovascular: Negative.   Gastrointestinal: Negative.   Musculoskeletal: Positive for arthralgias.  Neurological: Negative.   Hematological: Negative.   Psychiatric/Behavioral: Negative.   All other systems reviewed and are negative.   BP 150/62 mmHg  Pulse 75  Ht 5' 3"  (1.6 m)  Wt 166 lb 12 oz (75.637 kg)  BMI 29.55 kg/m2  Physical Exam  Constitutional: She is oriented to person, place, and time. She appears well-developed and well-nourished.  HENT:  Head: Normocephalic.  Nose: Nose normal.  Mouth/Throat: Oropharynx is clear and moist.  Eyes: Conjunctivae are normal. Pupils are equal,  round, and reactive to light.  Neck: Normal range of motion. Neck supple. No JVD present.  Cardiovascular: Normal rate, regular rhythm, S1 normal, S2 normal, normal heart sounds and intact distal pulses.  Exam reveals no gallop and no friction rub.   No murmur heard. Well healing mediastinal incision  Pulmonary/Chest: Effort normal and breath sounds normal. No respiratory distress. She has no wheezes. She has no rales. She exhibits no tenderness.  Abdominal: Soft. Bowel sounds are normal. She exhibits no distension. There is no tenderness.  Musculoskeletal: Normal range of motion. She exhibits no edema or tenderness.  Lymphadenopathy:    She has no cervical adenopathy.  Neurological: She is alert and oriented to person, place, and time. Coordination normal.  Skin: Skin is warm and dry. No rash noted. No erythema.  Psychiatric: She has a normal mood and affect. Her behavior is normal. Judgment and thought content normal.     Assessment and Plan  Nursing note and vitals reviewed.

## 2015-02-10 NOTE — Assessment & Plan Note (Signed)
Currently with no symptoms of angina. No further workup at this time. Continue current medication regimen. Recovered well from her bypass surgery 2 years ago No symptoms of angina

## 2015-02-10 NOTE — Assessment & Plan Note (Signed)
Blood pressure is elevated today. She reports it is typically well controlled at home

## 2015-02-10 NOTE — Assessment & Plan Note (Signed)
Risk factors include diabetes, high cholesterol. These were discussed with her in detail Currently with no angina

## 2015-02-10 NOTE — Assessment & Plan Note (Signed)
Prescription provided for Zetia 10 mg daily. She will check the price Scheduled to go generic soon Goal LDL less than 70

## 2015-02-10 NOTE — Assessment & Plan Note (Signed)
Strongly recommended she start a regular exercise program This will help with weight loss. Stress importance of dietary restriction

## 2015-02-10 NOTE — Patient Instructions (Addendum)
You are doing well.  Please start zetia one a day  Take miralex and citracel daily  Start a walking program   Please call us if you have new issues that need to be addressed before your next appt.  Your physician wants you to follow-up in: 6 months.  You will receive a reminder letter in the mail two months in advance. If you don't receive a letter, please call our office to schedule the follow-up appointment.

## 2015-02-10 NOTE — Assessment & Plan Note (Signed)
Currently without symptoms of angina. No further testing needed Stressed the importance of an exercise program, weight loss

## 2015-02-11 ENCOUNTER — Other Ambulatory Visit: Payer: Self-pay

## 2015-02-11 MED ORDER — METHOTREXATE 2.5 MG PO TABS: 10.0000 mg | ORAL_TABLET | ORAL | Status: DC

## 2015-02-11 NOTE — Telephone Encounter (Signed)
Please advise refill, last OV was 06/12/14?

## 2015-02-12 ENCOUNTER — Telehealth: Payer: Self-pay | Admitting: Internal Medicine

## 2015-02-12 NOTE — Telephone Encounter (Signed)
Discussed with pt that there are no appts available until Monday. Pt states she cannot wait that long. Encouraged pt to contact her PCP or an Urgent care. Pt verbalized understanding.

## 2015-02-13 ENCOUNTER — Emergency Department
Admission: EM | Admit: 2015-02-13 | Discharge: 2015-02-13 | Disposition: A | Discharge status: Home / Self Care | Payer: Medicare Other | Attending: Emergency Medicine | Admitting: Emergency Medicine

## 2015-02-13 ENCOUNTER — Emergency Department: Payer: Medicare Other

## 2015-02-13 DIAGNOSIS — E109 Type 1 diabetes mellitus without complications: Secondary | ICD-10-CM | POA: Diagnosis not present

## 2015-02-13 DIAGNOSIS — Z87891 Personal history of nicotine dependence: Secondary | ICD-10-CM | POA: Diagnosis not present

## 2015-02-13 DIAGNOSIS — Z7982 Long term (current) use of aspirin: Secondary | ICD-10-CM | POA: Insufficient documentation

## 2015-02-13 DIAGNOSIS — C569 Malignant neoplasm of unspecified ovary: Secondary | ICD-10-CM

## 2015-02-13 DIAGNOSIS — R1084 Generalized abdominal pain: Secondary | ICD-10-CM

## 2015-02-13 DIAGNOSIS — Z79899 Other long term (current) drug therapy: Secondary | ICD-10-CM | POA: Insufficient documentation

## 2015-02-13 DIAGNOSIS — N9489 Other specified conditions associated with female genital organs and menstrual cycle: Secondary | ICD-10-CM | POA: Insufficient documentation

## 2015-02-13 LAB — LIPASE, BLOOD: Lipase: 32 U/L (ref 22–51)

## 2015-02-13 LAB — CBC WITH DIFFERENTIAL/PLATELET
BASOS ABS: 0.1 10*3/uL (ref 0–0.1)
BASOS PCT: 1 %
EOS ABS: 0 10*3/uL (ref 0–0.7)
EOS PCT: 1 %
HCT: 32.4 % — ABNORMAL LOW (ref 35.0–47.0)
Hemoglobin: 11.3 g/dL — ABNORMAL LOW (ref 12.0–16.0)
Lymphocytes Relative: 14 %
Lymphs Abs: 0.8 10*3/uL — ABNORMAL LOW (ref 1.0–3.6)
MCH: 30.9 pg (ref 26.0–34.0)
MCHC: 34.7 g/dL (ref 32.0–36.0)
MCV: 88.8 fL (ref 80.0–100.0)
MONO ABS: 0.7 10*3/uL (ref 0.2–0.9)
Monocytes Relative: 12 %
NEUTROS ABS: 4.1 10*3/uL (ref 1.4–6.5)
Neutrophils Relative %: 72 %
PLATELETS: 289 10*3/uL (ref 150–440)
RBC: 3.65 MIL/uL — ABNORMAL LOW (ref 3.80–5.20)
RDW: 13.4 % (ref 11.5–14.5)
WBC: 5.7 10*3/uL (ref 3.6–11.0)

## 2015-02-13 LAB — URINALYSIS COMPLETE WITH MICROSCOPIC (ARMC ONLY)
BILIRUBIN URINE: NEGATIVE
Glucose, UA: NEGATIVE mg/dL
HGB URINE DIPSTICK: NEGATIVE
KETONES UR: NEGATIVE mg/dL
LEUKOCYTES UA: NEGATIVE
NITRITE: NEGATIVE
PH: 6 (ref 5.0–8.0)
Protein, ur: NEGATIVE mg/dL
Specific Gravity, Urine: 1.025 (ref 1.005–1.030)

## 2015-02-13 LAB — COMPREHENSIVE METABOLIC PANEL
ALK PHOS: 75 U/L (ref 38–126)
ALT: 15 U/L (ref 14–54)
ANION GAP: 7 (ref 5–15)
AST: 25 U/L (ref 15–41)
Albumin: 3.2 g/dL — ABNORMAL LOW (ref 3.5–5.0)
BILIRUBIN TOTAL: 0.5 mg/dL (ref 0.3–1.2)
BUN: 10 mg/dL (ref 6–20)
CALCIUM: 8.6 mg/dL — AB (ref 8.9–10.3)
CO2: 26 mmol/L (ref 22–32)
Chloride: 104 mmol/L (ref 101–111)
Creatinine, Ser: 0.71 mg/dL (ref 0.44–1.00)
GFR calc non Af Amer: >60 mL/min (ref >60)
GLUCOSE: 215 mg/dL — AB (ref 65–99)
Potassium: 3.9 mmol/L (ref 3.5–5.1)
Sodium: 137 mmol/L (ref 135–145)
TOTAL PROTEIN: 6.7 g/dL (ref 6.5–8.1)

## 2015-02-13 LAB — TROPONIN I

## 2015-02-13 MED ORDER — OXYCODONE HCL 5 MG PO TABS: 5.0000 mg | ORAL_TABLET | Freq: Three times a day (TID) | ORAL | Status: DC | PRN

## 2015-02-13 MED ORDER — IOHEXOL 300 MG/ML  SOLN
100.0000 mL | Freq: Once | INTRAMUSCULAR | Status: AC | PRN
  Administered 2015-02-13: 100 mL via INTRAVENOUS

## 2015-02-13 MED ORDER — IOHEXOL 240 MG/ML SOLN
25.0000 mL | Freq: Once | INTRAMUSCULAR | Status: AC | PRN
  Administered 2015-02-13: 25 mL via ORAL

## 2015-02-13 NOTE — ED Notes (Signed)
Pt c/o generalized abd pain for the past 10days with nausea and fever 100.3 ,..denied V/D.Marland Kitchen

## 2015-02-13 NOTE — ED Provider Notes (Signed)
[facility] Emergency Department Provider Note  Time seen: 10:57 AM  I have reviewed the triage vital signs and the nursing notes.   HISTORY  Chief Complaint Abdominal Pain    HPI [name] is a 69 y.o. female with a past medical history of gastric reflux, hypertension, hyperlipidemia and a CHF, diabetes, MI, presents the emergency department with diffuse abdominal pain. According to the patient for the past 1.5 weeks she has had diffuse abdominal discomfort with bloating. Patient states this week she has had intermittent temperatures to 100.3. Denies nausea, vomiting. The patient did note constipation however 2 days ago she took MiraLAX and has had several bowel movements since. States she has been able to eat and drink without any difficulty. Denies black or bloody stool. Denies dysuria, or cloudy urine. States the discomfort is mild, and changes in location.   Past Medical History  Diagnosis Date  . Leukopenia 2012    s/p bone marrow biopsy, Dr. Ma Hillock  . Cholelithiasis   . GERD (gastroesophageal reflux disease)   . Hypertension   . Hyperlipidemia   . Esophageal stricture   . Chronic diastolic CHF (congestive heart failure) ([place])   . CAD (coronary artery disease)     a. 09/2012 Cath: LM nl, LAD 95p, 63m LCX 956mOM2 50, RCA 100.  . Marland Kitchenypokalemia   . Herniated disc   . Pneumonia 2013; 08/2012    "one lung; double" (10/18/2012)  . Exertional shortness of breath   . Type I diabetes mellitus ([facility]    "dx'd in 1957" (10/18/2012)  . History of pancytopenia   . H/O hiatal hernia   . Rheumatoid arthritis(714.0)   . NSTEMI (non-ST elevated myocardial infarction) ([facility] [place]5/2014    "mild" (10/18/2012)    Patient Active Problem List   Diagnosis Date Noted  . Angina pectoris associated with type 2 diabetes mellitus ([facility]10/03/2015  . History of esophageal stricture 07/08/2014  . Dysphagia, pharyngoesophageal phase 07/08/2014  . Candida esophagitis ([facility] [place] 05/15/2014  . Cataracts, bilateral 01/13/2014  . Medicare annual wellness visit, subsequent 09/20/2013  . CAD (coronary artery disease) of artery bypass graft 11/05/2012  . Anemia, iron deficiency 10/09/2012  . CAD (coronary artery disease) 10/01/2012  . Hearing loss 07/06/2011  . Diabetes type 1, controlled (HCCl[place]03/10/2011  . Gastroesophageal reflux disease with stricture 07/06/2011  . GERD (gastroesophageal reflux disease) 04/05/2011  . Osteoporosis, unspecified 04/05/2011  . Hypertension 01/10/2011  . Hyperlipidemia 01/10/2011  . RA (rheumatoid arthritis) (HC[facility]09/01/2011    Past Surgical History  Procedure Laterality Date  . Esophagogastroduodenoscopy  2012    Dr. IfMinna Merritts. Tubal ligation  1970  . Cataract extraction w/ intraocular lens implant Right 2010  . Cardiac catheterization  10/18/2012    "first one was today" (10/18/2012)  . Esophageal dilation      "3 or 4 times" (10/19/2011)  . Coronary artery bypass graft N/A 10/19/2012    Procedure: CORONARY ARTERY BYPASS GRAFTING (CABG);  Surgeon: StMelrose NakayamaMD;  [facility];  Laterality: N/A;    Current Outpatient Rx  Name  Route  Sig  Dispense  Refill  . amLODipine (NORVASC) 5 MG tablet   Oral   Take 1 tablet (5 mg total) by mouth daily.   90 tablet   1   . aspirin 81 MG tablet   Oral   Take 81 mg by mouth daily.         .Marland Kitchen  atorvastatin (LIPITOR) 80 MG tablet   Oral   Take 1 tablet (80 mg total) by mouth daily.   90 tablet   3   . Cholecalciferol (VITAMIN D) 2000 UNITS CAPS   Oral   Take 1 capsule by mouth daily.         Marland Kitchen etanercept (ENBREL) 25 MG injection   Subcutaneous   Inject 25 mg into the skin once a week.         . ezetimibe (ZETIA) 10 MG tablet   Oral   Take 1 tablet (10 mg total) by mouth daily.   30 tablet   11   . folic acid (FOLVITE) 1 MG tablet   Oral   Take 1 tablet (1 mg total) by mouth daily.   90 tablet   3   . furosemide (LASIX)  40 MG tablet   Oral   Take 1 tablet (40 mg total) by mouth 2 (two) times daily.   180 tablet   3   . ibuprofen (ADVIL,MOTRIN) 800 MG tablet   Oral   Take 1 tablet (800 mg total) by mouth 3 (three) times daily as needed.   270 tablet   3   . insulin lispro (HUMALOG) 100 UNIT/ML injection      As directed via Insulin pump   30 mL   5   . loratadine (CLARITIN) 10 MG tablet   Oral   Take 10 mg by mouth daily.         Marland Kitchen losartan (COZAAR) 100 MG tablet   Oral   Take 1 tablet (100 mg total) by mouth daily.   90 tablet   3   . methotrexate (RHEUMATREX) 2.5 MG tablet   Oral   Take 4 tablets (10 mg total) by mouth once a week. Takes every Wednesday.Caution:Chemotherapy. Protect from light.   64 tablet   3   . metoprolol tartrate (LOPRESSOR) 25 MG tablet   Oral   Take 1 tablet (25 mg total) by mouth 2 (two) times daily as needed.   180 tablet   3   . Multiple Vitamin (MULTIVITAMIN) tablet   Oral   Take 1 tablet by mouth daily.           . pantoprazole (PROTONIX) 40 MG tablet   Oral   Take 1 tablet (40 mg total) by mouth 2 (two) times daily. Patient taking differently: Take 40 mg by mouth daily.    180 tablet   3   . ranitidine (ZANTAC) 150 MG tablet   Oral   Take 1 tablet (150 mg total) by mouth 2 (two) times daily.   180 tablet   2   . saccharomyces boulardii (FLORASTOR) 250 MG capsule   Oral   Take 250 mg by mouth as needed.         . zoledronic acid (RECLAST) 5 MG/100ML SOLN   Intravenous   Inject 5 mg into the vein once.           Allergies Codeine  Family History  Problem Relation Age of Onset  . Diabetes Mother   . Arthritis Mother   . Diabetes Father   . Arthritis Father   . Bone cancer Sister     Social History Social History  Substance Use Topics  . Smoking status: Former Smoker -- 1.00 packs/day for 30 years    Types: Cigarettes    Quit date: 06/11/1994  . Smokeless tobacco: Never Used  . Alcohol Use: No  Review of  Systems Constitutional: Negative for fever. Cardiovascular: Negative for chest pain. Respiratory: Negative for shortness of breath. Gastrointestinal: Positive for abdominal discomfort, negative for nausea, vomiting, diarrhea. Positive for constipation which is now resolved. Genitourinary: Negative for dysuria. Neurological: Negative for headache 10-point ROS otherwise negative.  ____________________________________________   PHYSICAL EXAM:  VITAL SIGNS: ED Triage Vitals  Enc Vitals Group     BP 02/13/15 1017 126/89 mmHg     Pulse Rate 02/13/15 1017 74     Resp 02/13/15 1017 18     Temp 02/13/15 1017 98.5 F (36.9 C)     Temp Source 02/13/15 1017 Oral     SpO2 02/13/15 1017 96 %     Weight 02/13/15 1017 165 lb (74.844 kg)     Height 02/13/15 1017 _0  (1.6 m)     Head Cir --      Peak Flow --      Pain Score 02/13/15 1024 7     Pain Loc --      Pain Edu? --      Excl. in [place]? --     Constitutional: Alert and oriented. Well appearing and in no distress. Eyes: Normal exam ENT   Head: Normocephalic and atraumatic.   Mouth/Throat: Mucous membranes are moist. Cardiovascular: Normal rate, regular rhythm. No murmur Respiratory: Normal respiratory effort without tachypnea nor retractions. Breath sounds are clear and equal bilaterally. No wheezes/rales/rhonchi. Gastrointestinal: Soft, mild upper abdominal tenderness to palpation. Mild left lower quadrant tenderness to palpation. No rebound or guarding. Mild distention, but dull percussion. Musculoskeletal: Nontender with normal range of motion in all extremities.  Neurologic:  Normal speech and language. No gross focal neurologic deficits are appreciated. Speech is normal. Skin:  Skin is warm, dry and intact.  Psychiatric: Mood and affect are normal. Speech and behavior are normal. Patient exhibits appropriate insight and judgment.  ____________________________________________   RADIOLOGY  CT shows diffuse metastatic  disease likely ovarian in origin.  ____________________________________________    INITIAL IMPRESSION / ASSESSMENT AND PLAN / ED COURSE  Pertinent labs & imaging results that were available during my care of the patient were reviewed by me and considered in my medical decision making (see chart for details).  Patient with very mild abdominal tenderness on exam. States approximately 10 days of diffuse abdominal discomfort, fever to 100.3. Currently the patient appears well, no distress, normal vitals. Minimal tenderness on exam. We will proceed with labs, and ultimately will likely proceed with a CT abdomen and pelvis given the patient's age, comorbidities, and duration of discomfort.  Discussed CT findings with the patient. I called Dr. Mike Gip, she will call the patient Monday morning to arrange a follow-up appointment Monday or Tuesday. We will prescribe oxycodone for pain relief. The patient states she took this after a surgery without any issues. Discussed strict return precautions with the patient for any worsening pain, fevers. Patient agreeable to plan.  ____________________________________________   FINAL CLINICAL IMPRESSION(S) / ED DIAGNOSES  Abdominal pain Ovarian cancer Metastatic cancer  Harvest Dark, MD 02/13/15 1422

## 2015-02-13 NOTE — Discharge Instructions (Signed)
You have been seen in the emergency department for abdominal pain. Unfortunately your CAT scan has revealed likely ovarian cancer. Please follow-up with oncology as soon as possible. They will be calling you at the number provided to registration to arrange a follow-up appointment on Monday or Tuesday. If you do not hear from them by Monday 12 PM please call them at the number provided. Please return to the emergency department for any worsening abdominal pain, fever, or any other symptom personally concerning to your self. He may take your prescribed pain medication as needed for discomfort, as written.    Abdominal Pain, Adult Many things can cause abdominal pain. Usually, abdominal pain is not caused by a disease and will improve without treatment. It can often be observed and treated at home. Your health care provider will do a physical exam and possibly order blood tests and X-rays to help determine the seriousness of your pain. However, in many cases, more time must pass before a clear cause of the pain can be found. Before that point, your health care provider may not know if you need more testing or further treatment. HOME CARE INSTRUCTIONS Monitor your abdominal pain for any changes. The following actions may help to alleviate any discomfort you are experiencing:  Only take over-the-counter or prescription medicines as directed by your health care provider.  Do not take laxatives unless directed to do so by your health care provider.  Try a clear liquid diet (broth, tea, or water) as directed by your health care provider. Slowly move to a bland diet as tolerated. SEEK MEDICAL CARE IF:  You have unexplained abdominal pain.  You have abdominal pain associated with nausea or diarrhea.  You have pain when you urinate or have a bowel movement.  You experience abdominal pain that wakes you in the night.  You have abdominal pain that is worsened or improved by eating food.  You have  abdominal pain that is worsened with eating fatty foods.  You have a fever. SEEK IMMEDIATE MEDICAL CARE IF:  Your pain does not go away within 2 hours.  You keep throwing up (vomiting).  Your pain is felt only in portions of the abdomen, such as the right side or the left lower portion of the abdomen.  You pass bloody or black tarry stools. MAKE SURE YOU:  Understand these instructions.  Will watch your condition.  Will get help right away if you are not doing well or get worse.   This information is not intended to replace advice given to you by your health care provider. Make sure you discuss any questions you have with your health care provider.   Document Released: 01/26/2005 Document Revised: 01/07/2015 Document Reviewed: 12/26/2012 Elsevier Interactive Patient Education [employer].

## 2015-02-17 ENCOUNTER — Inpatient Hospital Stay: Payer: Medicare Other | Attending: Hematology and Oncology | Admitting: Hematology and Oncology

## 2015-02-17 VITALS — BP 151/71 | HR 79 | Temp 98.4°F | Resp 18 | Ht 63.0 in | Wt 166.4 lb

## 2015-02-17 DIAGNOSIS — R05 Cough: Secondary | ICD-10-CM

## 2015-02-17 DIAGNOSIS — E785 Hyperlipidemia, unspecified: Secondary | ICD-10-CM | POA: Diagnosis not present

## 2015-02-17 DIAGNOSIS — K8020 Calculus of gallbladder without cholecystitis without obstruction: Secondary | ICD-10-CM

## 2015-02-17 DIAGNOSIS — C569 Malignant neoplasm of unspecified ovary: Secondary | ICD-10-CM | POA: Diagnosis present

## 2015-02-17 DIAGNOSIS — Z8781 Personal history of (healed) traumatic fracture: Secondary | ICD-10-CM | POA: Diagnosis not present

## 2015-02-17 DIAGNOSIS — R509 Fever, unspecified: Secondary | ICD-10-CM

## 2015-02-17 DIAGNOSIS — I252 Old myocardial infarction: Secondary | ICD-10-CM

## 2015-02-17 DIAGNOSIS — D649 Anemia, unspecified: Secondary | ICD-10-CM | POA: Diagnosis not present

## 2015-02-17 DIAGNOSIS — Z794 Long term (current) use of insulin: Secondary | ICD-10-CM | POA: Diagnosis not present

## 2015-02-17 DIAGNOSIS — E109 Type 1 diabetes mellitus without complications: Secondary | ICD-10-CM | POA: Diagnosis not present

## 2015-02-17 DIAGNOSIS — R5383 Other fatigue: Secondary | ICD-10-CM

## 2015-02-17 DIAGNOSIS — R197 Diarrhea, unspecified: Secondary | ICD-10-CM | POA: Diagnosis not present

## 2015-02-17 DIAGNOSIS — Z87891 Personal history of nicotine dependence: Secondary | ICD-10-CM

## 2015-02-17 DIAGNOSIS — I2510 Atherosclerotic heart disease of native coronary artery without angina pectoris: Secondary | ICD-10-CM | POA: Diagnosis not present

## 2015-02-17 DIAGNOSIS — C801 Malignant (primary) neoplasm, unspecified: Secondary | ICD-10-CM

## 2015-02-17 DIAGNOSIS — I5032 Chronic diastolic (congestive) heart failure: Secondary | ICD-10-CM

## 2015-02-17 DIAGNOSIS — K222 Esophageal obstruction: Secondary | ICD-10-CM | POA: Diagnosis not present

## 2015-02-17 DIAGNOSIS — I10 Essential (primary) hypertension: Secondary | ICD-10-CM | POA: Diagnosis not present

## 2015-02-17 DIAGNOSIS — R140 Abdominal distension (gaseous): Secondary | ICD-10-CM | POA: Diagnosis not present

## 2015-02-17 DIAGNOSIS — C786 Secondary malignant neoplasm of retroperitoneum and peritoneum: Secondary | ICD-10-CM | POA: Diagnosis not present

## 2015-02-17 DIAGNOSIS — Z8701 Personal history of pneumonia (recurrent): Secondary | ICD-10-CM

## 2015-02-17 DIAGNOSIS — R0602 Shortness of breath: Secondary | ICD-10-CM | POA: Diagnosis not present

## 2015-02-17 DIAGNOSIS — R1903 Right lower quadrant abdominal swelling, mass and lump: Secondary | ICD-10-CM

## 2015-02-17 DIAGNOSIS — E876 Hypokalemia: Secondary | ICD-10-CM

## 2015-02-17 DIAGNOSIS — Z7982 Long term (current) use of aspirin: Secondary | ICD-10-CM

## 2015-02-17 DIAGNOSIS — D72819 Decreased white blood cell count, unspecified: Secondary | ICD-10-CM

## 2015-02-17 DIAGNOSIS — Z79899 Other long term (current) drug therapy: Secondary | ICD-10-CM

## 2015-02-17 DIAGNOSIS — R188 Other ascites: Secondary | ICD-10-CM

## 2015-02-17 DIAGNOSIS — K219 Gastro-esophageal reflux disease without esophagitis: Secondary | ICD-10-CM | POA: Diagnosis not present

## 2015-02-17 DIAGNOSIS — N9489 Other specified conditions associated with female genital organs and menstrual cycle: Secondary | ICD-10-CM

## 2015-02-17 DIAGNOSIS — M069 Rheumatoid arthritis, unspecified: Secondary | ICD-10-CM | POA: Diagnosis not present

## 2015-02-17 DIAGNOSIS — R1904 Left lower quadrant abdominal swelling, mass and lump: Secondary | ICD-10-CM | POA: Diagnosis not present

## 2015-02-17 LAB — CBC WITH DIFFERENTIAL/PLATELET
Basophils Absolute: 0.1 10*3/uL (ref 0–0.1)
Basophils Relative: 1 %
Eosinophils Absolute: 0.1 10*3/uL (ref 0–0.7)
Eosinophils Relative: 1 %
HCT: 33 % — ABNORMAL LOW (ref 35.0–47.0)
Hemoglobin: 11.1 g/dL — ABNORMAL LOW (ref 12.0–16.0)
Lymphocytes Relative: 14 %
Lymphs Abs: 0.8 10*3/uL — ABNORMAL LOW (ref 1.0–3.6)
MCH: 29.4 pg (ref 26.0–34.0)
MCHC: 33.5 g/dL (ref 32.0–36.0)
MCV: 87.7 fL (ref 80.0–100.0)
Monocytes Absolute: 0.8 10*3/uL (ref 0.2–0.9)
Monocytes Relative: 15 %
Neutro Abs: 3.7 10*3/uL (ref 1.4–6.5)
Neutrophils Relative %: 69 %
Platelets: 340 10*3/uL (ref 150–440)
RBC: 3.76 MIL/uL — ABNORMAL LOW (ref 3.80–5.20)
RDW: 13.4 % (ref 11.5–14.5)
WBC: 5.4 10*3/uL (ref 3.6–11.0)

## 2015-02-17 LAB — RETICULOCYTES
RBC.: 3.76 MIL/uL — ABNORMAL LOW (ref 3.80–5.20)
Retic Count, Absolute: 60.2 10*3/uL (ref 19.0–183.0)
Retic Ct Pct: 1.6 % (ref 0.4–3.1)

## 2015-02-17 LAB — TSH: TSH: 3.201 u[IU]/mL (ref 0.350–4.500)

## 2015-02-17 LAB — FERRITIN: Ferritin: 141 ng/mL (ref 11–307)

## 2015-02-17 LAB — FOLATE: Folate: 67.5 ng/mL (ref >5.9)

## 2015-02-17 LAB — IRON AND TIBC
Iron: 14 ug/dL — ABNORMAL LOW (ref 28–170)
Saturation Ratios: 6 % — ABNORMAL LOW (ref 10.4–31.8)
TIBC: 230 ug/dL — ABNORMAL LOW (ref 250–450)
UIBC: 216 ug/dL

## 2015-02-17 LAB — VITAMIN B12: Vitamin B-12: 1275 pg/mL — ABNORMAL HIGH (ref 180–914)

## 2015-02-17 LAB — PROTIME-INR
INR: 1.02
Prothrombin Time: 13.6 seconds (ref 11.4–15.0)

## 2015-02-17 LAB — APTT: aPTT: 28 seconds (ref 24–36)

## 2015-02-17 NOTE — Progress Notes (Signed)
Pt urgent care because of fevers and a bloated feeling.  Urgent care sent pt to the ER.  At the ER sent for CT of abdomin and blood work.  Pt reports having been given a diagnosis of ovarian cancer and abnormalities in other areas.  Reports that their were 3 lesions to liver.  Pt reports having fevers the highest 100.3 not every day but often.  Sometimes pt has nausea that interferes with diet, feels to full to eat at times,  and fatigue that is interfering with activities.  Sob the come from exertion and end sitting at times.  Pt abdominal area is tender, and appears swollen and has some tightness.  Pt reports having diarreah around four times a day.

## 2015-02-17 NOTE — Progress Notes (Signed)
[facility] day:  02/17/2015  Chief Complaint: [name] is a 69 y.o. female with bilateral ovarian masses with associated peritoneal metastatic disease who is referred in consultation by Dr. Harvest Dark.  HPI:  The patient presented to the emergency room at [facility] on 02/13/2015 with a 2-3 week history of diffuse abdominal discomfort and bloating.  She also noted intermittent temperatures to 100.3 beginning 02/12/2015.  Exam revealed mild distension with tenderness in the upper abdomen and left lower quadrant.  Labs were notable for a hematocrit of 32.4, hemoglobin 10.3, MCV 88.8, and albumen 3.3.   Abdomen and pelvic CT scan on 02/13/2015 revealed bilateral mass-like adnexal regions.  The right adnexal region measured approximately 5.6 x 5.0 cm posterior to the uterus, and the left adnexa demonstrated a large mass measuring approximately 10.3 x 6.0 x 9.9 cm, with heterogeneous internal areas of enhancement and cystic change. This appeared intimately associated with the adjacent proximal sigmoid colon.  There was a large amount of soft tissue throughout the peritoneal cavity involving the omentum and other peritoneal surfaces, compatible with widespread intraperitoneal metastatic disease.  There was a small volume of presumably malignant ascites. There was a peripheral low-attenuation lesion overlying the right lobe of the liver measuring approximately 3.3 x 1.9 cm, favored to represent a serosal implant. There was a similar appearing 1.4 x 2.2 cm ill-defined peripheral lesion within the inferior aspect of segment 6 adjacent to the ampulla in the duodenum. There was no lytic or blastic lesions.  She had a chronic T12 compression fracture.  Regarding health maintenance issues, she has had 2 colonoscopies.  Last mammogram was in 11/2013.  She has not had a GYN evaluation since age 41.  Symptomatically, she denies any pain.  She notes abdominal  discomfort and bloating.  She has mild shortness of breath secondary to bloating.  Weight is stable.  Past Medical History  Diagnosis Date  . Leukopenia 2012    s/p bone marrow biopsy, Dr. Ma Hillock  . Cholelithiasis   . GERD (gastroesophageal reflux disease)   . Hypertension   . Hyperlipidemia   . Esophageal stricture   . Chronic diastolic CHF (congestive heart failure) ([place])   . CAD (coronary artery disease)     a. 09/2012 Cath: LM nl, LAD 95p, 37m LCX 920mOM2 50, RCA 100.  . Marland Kitchenypokalemia   . Herniated disc   . Pneumonia 2013; 08/2012    "one lung; double" (10/18/2012)  . Exertional shortness of breath   . Type I diabetes mellitus ([place]    "dx'd in 1957" (10/18/2012)  . History of pancytopenia   . H/O hiatal hernia   . Rheumatoid arthritis(714.0)   . NSTEMI (non-ST elevated myocardial infarction) [place]    "mild" (10/18/2012)    Past Surgical History  Procedure Laterality Date  . Esophagogastroduodenoscopy  2012    Dr. IfMinna Merritts. Tubal ligation  1970  . Cataract extraction w/ intraocular lens implant Right 2010  . Cardiac catheterization  10/18/2012    "first one was today" (10/18/2012)  . Esophageal dilation      "3 or 4 times" (10/19/2011)  . Coronary artery bypass graft N/A 10/19/2012    Procedure: CORONARY ARTERY BYPASS GRAFTING (CABG);  Surgeon: StMelrose NakayamaMD;  [facility];  Laterality: N/A;    Family History  Problem Relation Age of Onset  . Diabetes Mother   . Arthritis Mother   .  Diabetes Father   . Arthritis Father   . Bone cancer Sister     Social History:  reports that she quit smoking about 20 years ago. Her smoking use included Cigarettes. She has a 30 pack-year smoking history. She has never used smokeless tobacco. She reports that she does not drink alcohol or use illicit drugs.  The patient is accompanied by her husband today.  They have a trip planned to the [facility].  Allergies:  Allergies   Allergen Reactions  . Codeine     nausea    Current Medications: Current Outpatient Prescriptions  Medication Sig Dispense Refill  . amLODipine (NORVASC) 5 MG tablet Take 1 tablet (5 mg total) by mouth daily. 90 tablet 1  . aspirin 81 MG tablet Take 81 mg by mouth daily.    Marland Kitchen atorvastatin (LIPITOR) 80 MG tablet Take 1 tablet (80 mg total) by mouth daily. 90 tablet 3  . Cholecalciferol (VITAMIN D) 2000 UNITS CAPS Take 1 capsule by mouth daily.    Marland Kitchen etanercept (ENBREL) 25 MG injection Inject 25 mg into the skin once a week.    . ezetimibe (ZETIA) 10 MG tablet Take 1 tablet (10 mg total) by mouth daily. 30 tablet 11  . folic acid (FOLVITE) 1 MG tablet Take 1 tablet (1 mg total) by mouth daily. 90 tablet 3  . furosemide (LASIX) 40 MG tablet Take 1 tablet (40 mg total) by mouth 2 (two) times daily. 180 tablet 3  . ibuprofen (ADVIL,MOTRIN) 800 MG tablet Take 1 tablet (800 mg total) by mouth 3 (three) times daily as needed. 270 tablet 3  . insulin lispro (HUMALOG) 100 UNIT/ML injection As directed via Insulin pump 30 mL 5  . loratadine (CLARITIN) 10 MG tablet Take 10 mg by mouth daily.    Marland Kitchen losartan (COZAAR) 100 MG tablet Take 1 tablet (100 mg total) by mouth daily. 90 tablet 3  . methotrexate (RHEUMATREX) 2.5 MG tablet Take 4 tablets (10 mg total) by mouth once a week. Takes every Wednesday.Caution:Chemotherapy. Protect from light. 64 tablet 3  . metoprolol tartrate (LOPRESSOR) 25 MG tablet Take 1 tablet (25 mg total) by mouth 2 (two) times daily as needed. 180 tablet 3  . Multiple Vitamin (MULTIVITAMIN) tablet Take 1 tablet by mouth daily.      Marland Kitchen oxyCODONE (ROXICODONE) 5 MG immediate release tablet Take 1 tablet (5 mg total) by mouth every 8 (eight) hours as needed. 20 tablet 0  . pantoprazole (PROTONIX) 40 MG tablet Take 1 tablet (40 mg total) by mouth 2 (two) times daily. (Patient taking differently: Take 40 mg by mouth daily. ) 180 tablet 3  . ranitidine (ZANTAC) 150 MG tablet Take 1 tablet  (150 mg total) by mouth 2 (two) times daily. 180 tablet 2  . saccharomyces boulardii (FLORASTOR) 250 MG capsule Take 250 mg by mouth as needed.    . zoledronic acid (RECLAST) 5 MG/100ML SOLN Inject 5 mg into the vein once.     No current facility-administered medications for this visit.    Review of Systems:  GENERAL:  Fatigue.  Low grade fever.  No sweats or weight loss. PERFORMANCE STATUS (ECOG):  1 HEENT:  Sinus cough.  Tickle in throat.  No visual changes, sore throat, mouth sores or tenderness. Lungs: No shortness of breath or cough.  No hemoptysis. Cardiac:  No chest pain, palpitations, orthopnea, or PND. GI:  Bloating.  Upper abdomen tight.  Diarrhea.  No nausea, vomiting, constipation, melena or hematochezia.  GU:  No urgency, frequency, dysuria, or hematuria. Musculoskeletal:  Severe rheumatoid arthritis.  Osteoporosis.  No back pain. No muscle tenderness. Extremities:  No pain or swelling. Skin:  No rashes or skin changes. Neuro:  No headache, numbness or weakness, balance or coordination issues. Endocrine:  Diabetes.  No thyroid issues, hot flashes or night sweats. Psych:  No mood changes, depression or anxiety. Pain:  No focal pain. Review of systems:  All other systems reviewed and found to be negative.  Physical Exam: Blood pressure 151/71, pulse 79, temperature 98.4 F (36.9 C), temperature source Tympanic, resp. rate 18, height 5' 3"  (1.6 m), weight 166 lb 7.2 oz (75.5 kg). GENERAL:  Well developed, well nourished, sitting comfortably in the exam room in no acute distress. MENTAL STATUS:  Alert and oriented to person, place and time. HEAD:  Short styled gray hair.  Normocephalic, atraumatic, face symmetric, no Cushingoid features. EYES:  Glasses.  Blue eyes.  Pupils equal round and reactive to light and accomodation.  No conjunctivitis or scleral icterus. ENT:  Oropharynx clear without lesion.  Tongue normal. Mucous membranes moist.  RESPIRATORY:  Clear to auscultation  without rales, wheezes or rhonchi. CARDIOVASCULAR:  Regular rate and rhythm without murmur, rub or gallop. ABDOMEN:  Mild abdominal distension.  Soft, sightly tender in the epigastric region without guarding or rebound tenderness.  Left lower quadrant fullness.  Active bowel sounds and no appreciable hepatosplenomegaly.  No palpable nodularity. SKIN:  No rashes, ulcers or lesions. EXTREMITIES: No edema, no skin discoloration or tenderness.  No palpable cords. LYMPH NODES: No palpable cervical, supraclavicular, axillary or inguinal adenopathy  NEUROLOGICAL: Unremarkable. PSYCH:  Appropriate.  Office Visit on 02/17/2015  Component Date Value Ref Range Status  . WBC 02/17/2015 5.4  3.6 - 11.0 K/uL Final  . RBC 02/17/2015 3.76* 3.80 - 5.20 MIL/uL Final  . Hemoglobin 02/17/2015 11.1* 12.0 - 16.0 g/dL Final  . HCT 02/17/2015 33.0* 35.0 - 47.0 % Final  . MCV 02/17/2015 87.7  80.0 - 100.0 fL Final  . MCH 02/17/2015 29.4  26.0 - 34.0 pg Final  . MCHC 02/17/2015 33.5  32.0 - 36.0 g/dL Final  . RDW 02/17/2015 13.4  11.5 - 14.5 % Final  . Platelets 02/17/2015 340  150 - 440 K/uL Final  . Neutrophils Relative % 02/17/2015 69   Final  . Neutro Abs 02/17/2015 3.7  1.4 - 6.5 K/uL Final  . Lymphocytes Relative 02/17/2015 14   Final  . Lymphs Abs 02/17/2015 0.8* 1.0 - 3.6 K/uL Final  . Monocytes Relative 02/17/2015 15   Final  . Monocytes Absolute 02/17/2015 0.8  0.2 - 0.9 K/uL Final  . Eosinophils Relative 02/17/2015 1   Final  . Eosinophils Absolute 02/17/2015 0.1  0 - 0.7 K/uL Final  . Basophils Relative 02/17/2015 1   Final  . Basophils Absolute 02/17/2015 0.1  0 - 0.1 K/uL Final  . Retic Ct Pct 02/17/2015 1.6  0.4 - 3.1 % Final  . RBC. 02/17/2015 3.76* 3.80 - 5.20 MIL/uL Final  . Retic Count, Manual 02/17/2015 60.2  19.0 - 183.0 K/uL Final    Assessment:  [name] is a 69 y.o. female with clinical stage IIIC (T3cN1Mx) ovarian cancer presenting with abdominal discomfort and bloating.     Abdomen and pelvic CT scan on 02/13/2015 revealed bilateral mass-like adnexal regions (right adnexal mass 5.6 x 5.0 cm and the left adnexa mass 10.3 x 6.0 x 9.9 cm).  There was a large amount of soft  tissue throughout the peritoneal cavity involving the omentum and other peritoneal surfaces.  There was a small volume ascites. There was a peripheral 3.3 x 1.9 cm low-attenuation lesion overlying the right lobe of the liver , likely representing a serosal implant. There was 1.4 x 2.2 cm ill-defined peripheral lesion within the inferior aspect of segment 6 adjacent to the ampulla in the duodenum.   She has severe rheumatoid arthritis and is on methotrexate and Enbrel.  She has a normocytic anemia.  She denies any melena or hematochezia.  Symptomatically, she has abdominal bloating and mild shortness of breath.  Plan: 1.  Review imaging studies.  Discuss likely diagnosis of stage IIIC ovarian cancer (IV if pleural fluid +). Discuss obtaining tissue for diagnosis.  Discuss adjuvant versus neoadjuvant chemotherapy.  Discuss evaluation by gynecology oncology.  Discuss port-a-cath placement. 2.  Labs today:  CBC with diff, PT, PTT, CA125. 3.  Anemia evaluation:  ferritin, iron studies, retic, B12, folate, TSH. 4.  Consult Dr. Theora Gianotti, gynecology-oncology on 10/26 per patient's schedule. 5.  Schedule CT guided omental cake biopsy on 02/26/2015. 6.  Schedule Port-a-cath placement on 03/02/2015. 7.  Preauth chemotherapy (carboplatin and Taxol). 8.  Chemotherapy class on 02/24/2015. 9.  RTC on 03/03/2015 for MD assess, labs (CBC with diff, CMP, Mg), and cycle #1 neoadjuvant chemotherapy.   Lequita Asal, MD  02/17/2015, 4:57 PM

## 2015-02-17 NOTE — Progress Notes (Signed)
  Oncology Nurse Navigator Documentation  Referral date to RadOnc/MedOnc: 02/17/15 (02/17/15 1500) Navigator Encounter Type: Initial MedOnc (02/17/15 1500) Patient Visit Type: Medonc (02/17/15 1500) Treatment Phase: Abnormal Scans (02/17/15 1500)                  Time Spent with Patient: 60 (02/17/15 1500)   Met with patient and her spouse along with Dr Mike Gip. Introduced Art therapist as a resource to make her care less complicated. Provided her with my contact number and encouraged her to call with any questions or concerns. Plan for biopsy, chemo class, see Dr Theora Gianotti, port a cath. Labs today include CA125.

## 2015-02-18 ENCOUNTER — Inpatient Hospital Stay: Payer: Medicare Other

## 2015-02-18 LAB — CA 125: CA 125: 707 U/mL — ABNORMAL HIGH (ref 0.0–38.1)

## 2015-02-19 NOTE — Patient Instructions (Signed)

## 2015-02-20 ENCOUNTER — Inpatient Hospital Stay: Admission: RE | Admit: 2015-02-20 | Payer: Medicare Other | Source: Ambulatory Visit

## 2015-02-20 ENCOUNTER — Other Ambulatory Visit: Payer: Self-pay | Admitting: Radiology

## 2015-02-23 ENCOUNTER — Other Ambulatory Visit: Payer: Self-pay | Admitting: Vascular Surgery

## 2015-02-23 ENCOUNTER — Ambulatory Visit
Admission: RE | Admit: 2015-02-23 | Discharge: 2015-02-23 | Disposition: A | Discharge status: Home / Self Care | Payer: Medicare Other | Source: Ambulatory Visit | Attending: Hematology and Oncology | Admitting: Hematology and Oncology

## 2015-02-23 DIAGNOSIS — C801 Malignant (primary) neoplasm, unspecified: Secondary | ICD-10-CM | POA: Insufficient documentation

## 2015-02-23 DIAGNOSIS — C786 Secondary malignant neoplasm of retroperitoneum and peritoneum: Secondary | ICD-10-CM | POA: Insufficient documentation

## 2015-02-23 DIAGNOSIS — Z01812 Encounter for preprocedural laboratory examination: Secondary | ICD-10-CM | POA: Insufficient documentation

## 2015-02-23 DIAGNOSIS — N949 Unspecified condition associated with female genital organs and menstrual cycle: Secondary | ICD-10-CM | POA: Diagnosis present

## 2015-02-23 MED ORDER — FENTANYL CITRATE (PF) 100 MCG/2ML IJ SOLN
INTRAMUSCULAR | Status: AC
  Filled 2015-02-23: qty 2

## 2015-02-23 MED ORDER — MIDAZOLAM HCL 5 MG/5ML IJ SOLN
INTRAMUSCULAR | Status: AC
  Filled 2015-02-23: qty 5

## 2015-02-23 MED ORDER — FENTANYL CITRATE (PF) 100 MCG/2ML IJ SOLN
INTRAMUSCULAR | Status: AC | PRN
  Administered 2015-02-23 (×2): 25 ug via INTRAVENOUS

## 2015-02-23 MED ORDER — MIDAZOLAM HCL 2 MG/2ML IJ SOLN
INTRAMUSCULAR | Status: AC | PRN
  Administered 2015-02-23: 1 mg via INTRAVENOUS

## 2015-02-23 MED ORDER — SODIUM CHLORIDE 0.9 % IV SOLN
Freq: Once | INTRAVENOUS | Status: AC
  Administered 2015-02-23: 11:00:00 via INTRAVENOUS

## 2015-02-23 MED ORDER — MIDAZOLAM HCL 5 MG/5ML IJ SOLN
INTRAMUSCULAR | Status: AC | PRN
  Administered 2015-02-23: 1 mg via INTRAVENOUS

## 2015-02-23 NOTE — Discharge Instructions (Signed)
Needle Biopsy, Care After °Refer to this sheet in the next few weeks. These instructions provide you with information about caring for yourself after your procedure. Your health care provider may also give you more specific instructions. Your treatment has been planned according to current medical practices, but problems sometimes occur. Call your health care provider if you have any problems or questions after your procedure. °WHAT TO EXPECT AFTER THE PROCEDURE °After your procedure, it is common to have soreness, bruising, or mild pain at the biopsy site. This should go away in a few days. °HOME CARE INSTRUCTIONS °· Rest as directed by your health care provider. °· Take medicines only as directed by your health care provider. °· There are many different ways to close and cover the biopsy site, including stitches (sutures), skin glue, and adhesive strips. Follow your health care provider's instructions about: °¨ Biopsy site care. °¨ Bandage (dressing) changes and removal. °¨ Biopsy site closure removal. °· Check your biopsy site every day for signs of infection. Watch for: °¨ Redness, swelling, or pain. °¨ Fluid, blood, or pus. °SEEK MEDICAL CARE IF: °· You have a fever. °· You have redness, swelling, or pain at the biopsy site that lasts longer than a few days. °· You have fluid, blood, or pus coming from the biopsy site. °· You feel nauseous. °· You vomit. °SEEK IMMEDIATE MEDICAL CARE IF: °· You have shortness of breath. °· You have trouble breathing. °· You have chest pain.   °· You feel dizzy or you faint. °· You have bleeding that does not stop with pressure or a bandage. °· You cough up blood. °· You have pain in your abdomen. °  °This information is not intended to replace advice given to you by your health care provider. Make sure you discuss any questions you have with your health care provider. °  °Document Released: 09/02/2014 Document Reviewed: 09/02/2014 °Elsevier Interactive Patient Education ©2016  [employer] ° °

## 2015-02-23 NOTE — Procedures (Signed)
Interventional Radiology Procedure Note  Procedure: CT guided biopsy of left omental mass.   Complications: None  Estimated Blood Loss: 0  Recommendations:  - Bedrest x 2 hrs - DC home - Path pending  Signed,  Criselda Peaches, MD

## 2015-02-23 NOTE — OR Nursing (Signed)
fsbs 176, pt restarted insulin pump

## 2015-02-23 NOTE — OR Nursing (Signed)
No labs need per Dr Laurence Ferrari, labs drawn 10/18

## 2015-02-24 ENCOUNTER — Inpatient Hospital Stay: Payer: Medicare Other

## 2015-02-24 ENCOUNTER — Other Ambulatory Visit: Payer: Self-pay | Admitting: Hematology and Oncology

## 2015-02-24 DIAGNOSIS — C801 Malignant (primary) neoplasm, unspecified: Principal | ICD-10-CM

## 2015-02-24 DIAGNOSIS — C786 Secondary malignant neoplasm of retroperitoneum and peritoneum: Secondary | ICD-10-CM

## 2015-02-24 LAB — GLUCOSE, CAPILLARY
Glucose-Capillary: 140 mg/dL — ABNORMAL HIGH (ref 65–99)
Glucose-Capillary: 23 mg/dL — CL (ref 65–99)
Glucose-Capillary: 176 mg/dL — ABNORMAL HIGH (ref 65–99)

## 2015-02-24 MED ORDER — DEXAMETHASONE 4 MG PO TABS: ORAL_TABLET | ORAL | Status: DC

## 2015-02-25 ENCOUNTER — Encounter: Payer: Self-pay | Admitting: Obstetrics and Gynecology

## 2015-02-25 ENCOUNTER — Telehealth: Payer: Self-pay | Admitting: *Deleted

## 2015-02-25 ENCOUNTER — Inpatient Hospital Stay (HOSPITAL_BASED_OUTPATIENT_CLINIC_OR_DEPARTMENT_OTHER): Payer: Medicare Other | Admitting: Obstetrics and Gynecology

## 2015-02-25 ENCOUNTER — Encounter: Payer: Self-pay | Admitting: Hematology and Oncology

## 2015-02-25 ENCOUNTER — Telehealth: Payer: Self-pay | Admitting: Hematology and Oncology

## 2015-02-25 ENCOUNTER — Telehealth: Payer: Self-pay

## 2015-02-25 VITALS — BP 139/69 | HR 74 | Temp 97.9°F | Ht 63.0 in | Wt 170.4 lb

## 2015-02-25 DIAGNOSIS — C786 Secondary malignant neoplasm of retroperitoneum and peritoneum: Secondary | ICD-10-CM

## 2015-02-25 DIAGNOSIS — C801 Malignant (primary) neoplasm, unspecified: Secondary | ICD-10-CM | POA: Diagnosis not present

## 2015-02-25 DIAGNOSIS — R1900 Intra-abdominal and pelvic swelling, mass and lump, unspecified site: Secondary | ICD-10-CM | POA: Diagnosis not present

## 2015-02-25 NOTE — Telephone Encounter (Signed)
Called patient with results.  

## 2015-02-25 NOTE — Progress Notes (Signed)
Assisted MD with pelvic exam

## 2015-02-25 NOTE — Telephone Encounter (Signed)
Called Pathology per MD to discuss when path would be back.  According to path most are back within 48 hours, however some may take up to 5 days depending on test ordered.  Dr. Luana Shu ordered 4 tests that were sent out on 10/25.  Case # [id number].  Dr. Mike Gip may call Dr. Luana Shu directly to see if a preliminary is obtainable at [phone].

## 2015-02-25 NOTE — Telephone Encounter (Signed)
Re:  Patient questions  Called patient at home regarding questions she has about diagnosis and treatment.  No answer on phone.  Left message that we will discuss in clinic tomorrow.  No plan for chemotherapy yet as pathology pending.  Lequita Asal, MD

## 2015-02-25 NOTE — Telephone Encounter (Signed)
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

## 2015-02-25 NOTE — Progress Notes (Signed)
Gynecologic Oncology Consult Visit   Referring Provider: Lequita Asal, MD  Additional Care Provider: Dr. Harvest Dark.  Chief Concern: bilateral ovarian masses with associated peritoneal carcinomatosis  Subjective:  [name] is a 69 y.o. G3P4 female who is seen in consultation from Dr. Mike Gip for bilateral ovarian masses with associated peritoneal metastatic disease. She was initially referred to Dr. Mike Gip by Dr. Harvest Dark.    HPI: The patient presented to the emergency room at [facility] on 02/13/2015 with a 2-3 week history of diffuse abdominal discomfort and bloating. She also noted intermittent temperatures to 100.3 beginning 02/12/2015. Exam revealed mild distension with tenderness in the upper abdomen and left lower quadrant. Labs were notable for a hematocrit of 32.4, hemoglobin 10.3, MCV 88.8, and albumen 3.3.   Abdomen and pelvic CT scan on 02/13/2015 revealed bilateral mass-like adnexal regions. The right adnexal region measured approximately 5.6 x 5.0 cm posterior to the uterus, and the left adnexa demonstrated a large mass measuring approximately 10.3 x 6.0 x 9.9 cm, with heterogeneous internal areas of enhancement and cystic change. This appeared intimately associated with the adjacent proximal sigmoid colon. There was a large amount of soft tissue throughout the peritoneal cavity involving the omentum and other peritoneal surfaces, compatible with widespread intraperitoneal metastatic disease. There was a small volume of presumably malignant ascites. There was a peripheral low-attenuation lesion overlying the right lobe of the liver measuring approximately 3.3 x 1.9 cm, favored to represent a serosal implant. There was a similar appearing 1.4 x 2.2 cm ill-defined peripheral lesion within the inferior aspect of segment 6 adjacent to the ampulla in the duodenum. There was no lytic or blastic lesions. She had a chronic T12 compression  fracture.  02/23/2015 CT biopsy obtained, pathology pending  Regarding health maintenance issues, she has had 2 colonoscopies. Last mammogram was in 11/2013.   See ROS for pertinent symptoms  Lab Results  Component Value Date   CA125 707.0* 02/17/2015    Problem List: Patient Active Problem List   Diagnosis Date Noted  . Peritoneal carcinomatosis ([place]) 02/17/2015  . Anemia 02/17/2015  . Adnexal mass 02/13/2015  . Angina pectoris associated with type 2 diabetes mellitus ([place]) 02/10/2015  . History of esophageal stricture 07/08/2014  . Dysphagia, pharyngoesophageal phase 07/08/2014  . Candida esophagitis ([place]) 05/15/2014  . Cataracts, bilateral 01/13/2014  . Medicare annual wellness visit, subsequent 09/20/2013  . CAD (coronary artery disease) of artery bypass graft 11/05/2012  . Anemia, iron deficiency 10/09/2012  . CAD (coronary artery disease) 10/01/2012  . Hearing loss 07/06/2011  . Diabetes type 1, controlled ([place]) 07/06/2011  . Gastroesophageal reflux disease with stricture 07/06/2011  . GERD (gastroesophageal reflux disease) 04/05/2011  . Osteoporosis, unspecified 04/05/2011  . Hypertension 01/10/2011  . Hyperlipidemia 01/10/2011  . RA (rheumatoid arthritis) ([place]) 01/10/2011    Past Medical History: Past Medical History  Diagnosis Date  . Leukopenia 2012    s/p bone marrow biopsy, Dr. Ma Hillock  . Cholelithiasis   . GERD (gastroesophageal reflux disease)   . Hypertension   . Hyperlipidemia   . Esophageal stricture   . Chronic diastolic CHF (congestive heart failure) ([place])   . CAD (coronary artery disease)     a. 09/2012 Cath: LM nl, LAD 95p, 50m LCX 961mOM2 50, RCA 100.  . Marland Kitchenypokalemia   . Herniated disc   . Pneumonia 2013; 08/2012    "one lung; double" (10/18/2012)  . Exertional shortness of breath   . Type I diabetes mellitus ([place]    "  dx'd in 1957" (10/18/2012)  . History of pancytopenia   . H/O hiatal hernia   . Rheumatoid arthritis(714.0)   . NSTEMI  (non-ST elevated myocardial infarction) ([place]) 08/2012    "mild" (10/18/2012)    Past Surgical History: Past Surgical History  Procedure Laterality Date  . Esophagogastroduodenoscopy  2012    Dr. Minna Merritts  . Tubal ligation  1970  . Cataract extraction w/ intraocular lens implant Right 2010  . Cardiac catheterization  10/18/2012    "first one was today" (10/18/2012)  . Esophageal dilation      "3 or 4 times" (10/19/2011)  . Coronary artery bypass graft N/A 10/19/2012    Procedure: CORONARY ARTERY BYPASS GRAFTING (CABG);  Surgeon: Melrose Nakayama, MD;  Location: [place];  Service: Open Heart Surgery;  Laterality: N/A;    Past Gynecologic History:  Postmenopausal She has not had a GYN evaluation since age 46.    OB History:  G3P3  1. SVD - neonatal death 2. SVD - singleton 3. SVD - twins  Family History: Family History  Problem Relation Age of Onset  . Diabetes Mother   . Arthritis Mother   . Diabetes Father   . Arthritis Father   . Bone cancer Sister     Social History: Social History   Social History  . Marital Status: Married    Spouse Name: N/A  . Number of Children: 3  . Years of Education: N/A   Occupational History  . Retired    Social History Main Topics  . Smoking status: Former Smoker -- 1.00 packs/day for 30 years    Types: Cigarettes    Quit date: 06/11/1994  . Smokeless tobacco: Never Used  . Alcohol Use: No  . Drug Use: No  . Sexual Activity: No   Other Topics Concern  . Not on file   Social History Narrative    Allergies: Allergies  Allergen Reactions  . Codeine     nausea    Current Medications: Current Outpatient Prescriptions  Medication Sig Dispense Refill  . amLODipine (NORVASC) 5 MG tablet Take 1 tablet (5 mg total) by mouth daily. 90 tablet 1  . aspirin 81 MG tablet Take 81 mg by mouth daily.    Marland Kitchen atorvastatin (LIPITOR) 80 MG tablet Take 1 tablet (80 mg total) by mouth daily. 90 tablet 3  . Cholecalciferol (VITAMIN D) 2000  UNITS CAPS Take 1 capsule by mouth daily.    Marland Kitchen dexamethasone (DECADRON) 4 MG tablet 20 mg (5 tablets) 12 hours and 6 hours prior to chemotherapy 30 tablet 1  . etanercept (ENBREL) 25 MG injection Inject 25 mg into the skin once a week.    . ezetimibe (ZETIA) 10 MG tablet Take 1 tablet (10 mg total) by mouth daily. 30 tablet 11  . folic acid (FOLVITE) 1 MG tablet Take 1 tablet (1 mg total) by mouth daily. 90 tablet 3  . furosemide (LASIX) 40 MG tablet Take 1 tablet (40 mg total) by mouth 2 (two) times daily. 180 tablet 3  . ibuprofen (ADVIL,MOTRIN) 800 MG tablet Take 1 tablet (800 mg total) by mouth 3 (three) times daily as needed. 270 tablet 3  . insulin lispro (HUMALOG) 100 UNIT/ML injection As directed via Insulin pump 30 mL 5  . loratadine (CLARITIN) 10 MG tablet Take 10 mg by mouth daily.    Marland Kitchen losartan (COZAAR) 100 MG tablet Take 1 tablet (100 mg total) by mouth daily. 90 tablet 3  . methotrexate (RHEUMATREX) 2.5 MG  tablet Take 4 tablets (10 mg total) by mouth once a week. Takes every Wednesday.Caution:Chemotherapy. Protect from light. 64 tablet 3  . metoprolol tartrate (LOPRESSOR) 25 MG tablet Take 1 tablet (25 mg total) by mouth 2 (two) times daily as needed. 180 tablet 3  . Multiple Vitamin (MULTIVITAMIN) tablet Take 1 tablet by mouth daily.      . pantoprazole (PROTONIX) 40 MG tablet Take 1 tablet (40 mg total) by mouth 2 (two) times daily. (Patient taking differently: Take 40 mg by mouth daily. ) 180 tablet 3  . ranitidine (ZANTAC) 150 MG tablet Take 1 tablet (150 mg total) by mouth 2 (two) times daily. 180 tablet 2  . saccharomyces boulardii (FLORASTOR) 250 MG capsule Take 250 mg by mouth as needed.    . zoledronic acid (RECLAST) 5 MG/100ML SOLN Inject 5 mg into the vein once.     No current facility-administered medications for this visit.    Review of Systems General: fevers aroudn 100.3 Skin: negative for changes in color, texture, moles or lesions HEENT: negative for, change in  hearing, pain, discharge, tinnitus, vertigo, voice changes, sore throat, neck masses Pulmonary: dyspnea and dry cough for a couple of weeks Cardiac: negative for, palpitations, syncope, pain, discomfort, pressure Gastrointestinal: nausea, diarrhea  Several weeks. Diarrhea less than 6 episodes a day Genitourinary/Sexual: negative for, dysuria, hesitancy, nocturia, incontinence Ob/Gyn: negative for, irregular bleeding, pain Musculoskeletal: swelling feet for a couple of weeks Hematology: negative for, easy bruising, bleeding   Objective:  Physical Examination:  BP 139/69 mmHg  Pulse 74  Temp(Src) 97.9 F (36.6 C) (Oral)  Ht 5' 3"  (1.6 m)  Wt 170 lb 6.7 oz (77.3 kg)  BMI 30.20 kg/m2   ECOG Performance Status: 1 - Symptomatic but completely ambulatory  General appearance: alert, cooperative and appears stated age HEENT:PERRLA, extra ocular movement intact and sclera clear, anicteric Lymph node survey: non-palpable, axillary, inguinal, supraclavicular Cardiovascular: regular rate and rhythm Respiratory: normal air entry, lungs clear to auscultation but decreased breath sounds left posterior lobe Abdomen: soft, non-tender, without masses or organomegaly, protuberant, nondistended, normal bowel sounds and no masses palpated Back: inspection of back is normal Extremities: extremities normal, atraumatic, no cyanosis or edema Neurological exam reveals alert, oriented, normal speech, no focal findings or movement disorder noted.  Pelvic: exam chaperoned by nurse;  Vulva: normal appearing vulva with no masses, tenderness or lesions; Vagina: normal vagina; Adnexa: mass present midline irregular nodularity and firm measuring at least 3 cm side, Uterus: uterus not grossly enlarged and is nontender, exam limited by habitus; Cervix: no cervical motion tenderness and no lesions; Rectal: confirmed   Lab Review Lab Results  Component Value Date   WBC 5.4 02/17/2015   HGB 11.1* 02/17/2015   HCT  33.0* 02/17/2015   MCV 87.7 02/17/2015   PLT 340 02/17/2015     Chemistry      Component Value Date/Time   NA 137 02/13/2015 1116   NA 134* 09/27/2012 0459   K 3.9 02/13/2015 1116   K 4.5 09/27/2012 0459   CL 104 02/13/2015 1116   CL 100 09/27/2012 0459   CO2 26 02/13/2015 1116   CO2 28 09/27/2012 0459   BUN 10 02/13/2015 1116   BUN 8 09/27/2012 0459   CREATININE 0.71 02/13/2015 1116   CREATININE 0.60 09/27/2012 0459      Component Value Date/Time   CALCIUM 8.6* 02/13/2015 1116   CALCIUM 8.5 09/27/2012 0459   ALKPHOS 75 02/13/2015 1116   ALKPHOS 100 09/21/2012  1210   AST 25 02/13/2015 1116   AST 36 09/21/2012 1210   ALT 15 02/13/2015 1116   ALT 23 09/21/2012 1210   BILITOT 0.5 02/13/2015 1116   BILITOT 0.7 09/21/2012 1210          Assessment:  [name] is a 69 y.o. female diagnosed with bilateral ovarian masses with associated wide spread disseminated peritoneal metastatic disease. Medical co-morbidities complicating care: HTN, diabetes, CAD and obesity.  Plan:   Problem List Items Addressed This Visit      Other   Peritoneal carcinomatosis ([place]) - Primary    Other Visit Diagnoses    Pelvic mass in female           We discussed options for management if she has gynecologic malignancy. Based on the extensive disease on imaging I recommend neoadjuvant chemotherapy. If she has an excellent response we will consider surgery for debulking after 3-4 cycles of therapy.   Suggested return to clinic in  10 weeks to assess interval CT scan, CA125, and pelvic exam.   The patient's diagnosis, an outline of the further diagnostic and laboratory studies which will be required, the recommendation, and alternatives were discussed.  All questions were answered to the patient's satisfaction.  I also spoke with Dr. Mike Gip about our recommendations if gynecologic malignancy confirmed. Dr. Mike Gip will review patient's concerns regarding chemotherapy at their next visit.  At this point her plan is to proceed with paclitaxel and carboplatin based therapy.   Gillis Ends, MD    CC:   Referring Provider: Lequita Asal, MD  Additional Care Provider: Dr. Harvest Dark.

## 2015-02-26 ENCOUNTER — Ambulatory Visit: Payer: Medicare Other

## 2015-02-26 ENCOUNTER — Inpatient Hospital Stay: Payer: Medicare Other

## 2015-02-26 ENCOUNTER — Inpatient Hospital Stay (HOSPITAL_BASED_OUTPATIENT_CLINIC_OR_DEPARTMENT_OTHER): Payer: Medicare Other | Admitting: Hematology and Oncology

## 2015-02-26 VITALS — BP 146/63 | HR 81 | Temp 95.3°F | Resp 17 | Ht 63.0 in | Wt 170.2 lb

## 2015-02-26 DIAGNOSIS — C786 Secondary malignant neoplasm of retroperitoneum and peritoneum: Secondary | ICD-10-CM | POA: Diagnosis not present

## 2015-02-26 DIAGNOSIS — I10 Essential (primary) hypertension: Secondary | ICD-10-CM

## 2015-02-26 DIAGNOSIS — D649 Anemia, unspecified: Secondary | ICD-10-CM

## 2015-02-26 DIAGNOSIS — Z8701 Personal history of pneumonia (recurrent): Secondary | ICD-10-CM

## 2015-02-26 DIAGNOSIS — E109 Type 1 diabetes mellitus without complications: Secondary | ICD-10-CM

## 2015-02-26 DIAGNOSIS — Z794 Long term (current) use of insulin: Secondary | ICD-10-CM

## 2015-02-26 DIAGNOSIS — R140 Abdominal distension (gaseous): Secondary | ICD-10-CM

## 2015-02-26 DIAGNOSIS — R197 Diarrhea, unspecified: Secondary | ICD-10-CM

## 2015-02-26 DIAGNOSIS — Z79899 Other long term (current) drug therapy: Secondary | ICD-10-CM

## 2015-02-26 DIAGNOSIS — R5383 Other fatigue: Secondary | ICD-10-CM

## 2015-02-26 DIAGNOSIS — E786 Lipoprotein deficiency: Secondary | ICD-10-CM

## 2015-02-26 DIAGNOSIS — K222 Esophageal obstruction: Secondary | ICD-10-CM

## 2015-02-26 DIAGNOSIS — I2510 Atherosclerotic heart disease of native coronary artery without angina pectoris: Secondary | ICD-10-CM

## 2015-02-26 DIAGNOSIS — R509 Fever, unspecified: Secondary | ICD-10-CM

## 2015-02-26 DIAGNOSIS — C569 Malignant neoplasm of unspecified ovary: Secondary | ICD-10-CM | POA: Diagnosis not present

## 2015-02-26 DIAGNOSIS — Z87891 Personal history of nicotine dependence: Secondary | ICD-10-CM

## 2015-02-26 DIAGNOSIS — E785 Hyperlipidemia, unspecified: Secondary | ICD-10-CM

## 2015-02-26 DIAGNOSIS — D72819 Decreased white blood cell count, unspecified: Secondary | ICD-10-CM

## 2015-02-26 DIAGNOSIS — Z8781 Personal history of (healed) traumatic fracture: Secondary | ICD-10-CM

## 2015-02-26 DIAGNOSIS — K8020 Calculus of gallbladder without cholecystitis without obstruction: Secondary | ICD-10-CM

## 2015-02-26 DIAGNOSIS — K219 Gastro-esophageal reflux disease without esophagitis: Secondary | ICD-10-CM

## 2015-02-26 DIAGNOSIS — M069 Rheumatoid arthritis, unspecified: Secondary | ICD-10-CM

## 2015-02-26 DIAGNOSIS — Z7982 Long term (current) use of aspirin: Secondary | ICD-10-CM

## 2015-02-26 DIAGNOSIS — R0602 Shortness of breath: Secondary | ICD-10-CM

## 2015-02-26 DIAGNOSIS — R188 Other ascites: Secondary | ICD-10-CM | POA: Diagnosis not present

## 2015-02-26 DIAGNOSIS — I252 Old myocardial infarction: Secondary | ICD-10-CM

## 2015-02-26 DIAGNOSIS — C801 Malignant (primary) neoplasm, unspecified: Principal | ICD-10-CM

## 2015-02-26 DIAGNOSIS — I5032 Chronic diastolic (congestive) heart failure: Secondary | ICD-10-CM

## 2015-02-26 DIAGNOSIS — R05 Cough: Secondary | ICD-10-CM

## 2015-02-26 NOTE — Progress Notes (Signed)
[place] [facility] day:  02/26/2015  Chief Complaint: [name] is a 69 y.o. female with bilateral ovarian masses with associated peritoneal metastatic disease who is seen for review of interval studies and discussion regarding direction of therapy.  HPI:  The patient was last seen in the medical oncology clinic on 02/17/2015.  At that time she was seen for initial consultation.  She was felt to have ovarian cancer.  CA125 was 707.  She was scheduled to have a CT guided biopsy of the omental caking.  Biopsy on 02/23/2015 revealed metastatic high grade serous carcinoma, consistent with gynecologic origin.  She was seen by Dr. Theora Gianotti on 02/25/2015.  Based on her extensive disease, neoadjuvant chemotherapy was recommended.  If she has an excellent response, surgery for debulking will be considered after 3-4 cycles.  She is scheduled to return to her clinic in 10 weeks for CT scan, CA125, and pelvic exam.  At initial visit, she was noted to be anemic. CBC on 02/13/2015 revealed a hematocrit of 32.4, hemoglobin 11.3, and MCV 88.8.   Work-up revealed a ferritin of 141, iron saturation 6% with a TIBC 230 (low), B12 1275, folate 67.5, and TSH 3.201.  She attended the chemotherapy class.  She is scheduled for port-a-cath placement on 03/02/2015.    Symptomatically, she has ongoing abdominal discomfort and bloating.  She has had 4-6 loose stools a day.  She notes that she takes methotrexate on Wednesdays and Enbrel on Tuesdays.  Her blood sugar goes up with steroids.  Past Medical History  Diagnosis Date  . Leukopenia 2012    s/p bone marrow biopsy, Dr. Ma Hillock  . Cholelithiasis   . GERD (gastroesophageal reflux disease)   . Hypertension   . Hyperlipidemia   . Esophageal stricture   . Chronic diastolic CHF (congestive heart failure) ([place])   . CAD (coronary artery disease)     a. 09/2012 Cath: LM nl, LAD 95p, 61m LCX 952mOM2 50, RCA 100.  . Marland Kitchenypokalemia    . Herniated disc   . Pneumonia 2013; 08/2012    "one lung; double" (10/18/2012)  . Exertional shortness of breath   . Type I diabetes mellitus ([place]    "dx'd in 1957" (10/18/2012)  . History of pancytopenia   . H/O hiatal hernia   . Rheumatoid arthritis(714.0)   . NSTEMI (non-ST elevated myocardial infarction) ([place]    "mild" (10/18/2012)    Past Surgical History  Procedure Laterality Date  . Esophagogastroduodenoscopy  2012    Dr. IfMinna Merritts. Tubal ligation  1970  . Cataract extraction w/ intraocular lens implant Right 2010  . Cardiac catheterization  10/18/2012    "first one was today" (10/18/2012)  . Esophageal dilation      "3 or 4 times" (10/19/2011)  . Coronary artery bypass graft N/A 10/19/2012    Procedure: CORONARY ARTERY BYPASS GRAFTING (CABG);  Surgeon: StMelrose NakayamaMD;  [place] [facility] Service: [facility];  Laterality: N/A;    Family History  Problem Relation Age of Onset  . Diabetes Mother   . Arthritis Mother   . Diabetes Father   . Arthritis Father   . Bone cancer Sister     Social History:  reports that she quit smoking about 20 years ago. Her smoking use included Cigarettes. She has a 30 pack-year smoking history. She has never used smokeless tobacco. She reports that she does not drink alcohol or use illicit  drugs.  The patient is accompanied by her husband, another gentleman, and a woman today.  The patient and her husband recently returned from a trip to the [facility].  Allergies:  Allergies  Allergen Reactions  . Codeine     nausea    Current Medications: Current Outpatient Prescriptions  Medication Sig Dispense Refill  . amLODipine (NORVASC) 5 MG tablet Take 1 tablet (5 mg total) by mouth daily. 90 tablet 1  . aspirin 81 MG tablet Take 81 mg by mouth daily.    Marland Kitchen atorvastatin (LIPITOR) 80 MG tablet Take 1 tablet (80 mg total) by mouth daily. 90 tablet 3  . Cholecalciferol (VITAMIN D) 2000 UNITS CAPS Take 1 capsule  by mouth daily.    Marland Kitchen etanercept (ENBREL) 25 MG injection Inject 25 mg into the skin once a week.    . ezetimibe (ZETIA) 10 MG tablet Take 1 tablet (10 mg total) by mouth daily. 30 tablet 11  . folic acid (FOLVITE) 1 MG tablet Take 1 tablet (1 mg total) by mouth daily. 90 tablet 3  . furosemide (LASIX) 40 MG tablet Take 1 tablet (40 mg total) by mouth 2 (two) times daily. 180 tablet 3  . ibuprofen (ADVIL,MOTRIN) 800 MG tablet Take 1 tablet (800 mg total) by mouth 3 (three) times daily as needed. 270 tablet 3  . insulin lispro (HUMALOG) 100 UNIT/ML injection As directed via Insulin pump 30 mL 5  . loratadine (CLARITIN) 10 MG tablet Take 10 mg by mouth daily.    Marland Kitchen losartan (COZAAR) 100 MG tablet Take 1 tablet (100 mg total) by mouth daily. 90 tablet 3  . methotrexate (RHEUMATREX) 2.5 MG tablet Take 4 tablets (10 mg total) by mouth once a week. Takes every Wednesday.Caution:Chemotherapy. Protect from light. 64 tablet 3  . metoprolol tartrate (LOPRESSOR) 25 MG tablet Take 1 tablet (25 mg total) by mouth 2 (two) times daily as needed. 180 tablet 3  . Multiple Vitamin (MULTIVITAMIN) tablet Take 1 tablet by mouth daily.      . pantoprazole (PROTONIX) 40 MG tablet Take 1 tablet (40 mg total) by mouth 2 (two) times daily. (Patient taking differently: Take 40 mg by mouth daily. ) 180 tablet 3  . ranitidine (ZANTAC) 150 MG tablet Take 1 tablet (150 mg total) by mouth 2 (two) times daily. 180 tablet 2  . saccharomyces boulardii (FLORASTOR) 250 MG capsule Take 250 mg by mouth as needed.    . zoledronic acid (RECLAST) 5 MG/100ML SOLN Inject 5 mg into the vein once.    Marland Kitchen dexamethasone (DECADRON) 4 MG tablet 20 mg (5 tablets) 12 hours and 6 hours prior to chemotherapy (Patient not taking: Reported on 02/26/2015) 30 tablet 1   No current facility-administered medications for this visit.    Review of Systems:  GENERAL:  Fatigue.  Low grade fever.  No sweats or weight loss. PERFORMANCE STATUS (ECOG):  1 HEENT:   Sinus cough.  No visual changes, sore throat, mouth sores or tenderness. Lungs: No shortness of breath or cough.  No hemoptysis. Cardiac:  No chest pain, palpitations, orthopnea, or PND. GI:  Bloating.  Upper abdomen full.  Diarrhea/loose stools 4-6 a day.  No nausea, vomiting, constipation, melena or hematochezia. GU:  No urgency, frequency, dysuria, or hematuria. Musculoskeletal:  Severe rheumatoid arthritis.  Osteoporosis.  No back pain. No muscle tenderness. Extremities:  No pain or swelling. Skin:  No rashes or skin changes. Neuro:  No headache, numbness or weakness, balance or coordination issues.  Endocrine:  Diabetes.  No thyroid issues, hot flashes or night sweats. Psych:  No mood changes, depression or anxiety. Pain:  No focal pain. Review of systems:  All other systems reviewed and found to be negative.  Physical Exam: Blood pressure 146/63, pulse 81, temperature 95.3 F (35.2 C), temperature source Tympanic, resp. rate 17, height 5' 3"  (1.6 m), weight 170 lb 3.1 oz (77.2 kg). GENERAL:  Well developed, well nourished, sitting comfortably in the exam room in no acute distress. MENTAL STATUS:  Alert and oriented to person, place and time. HEAD:  Short styled gray hair.  Normocephalic, atraumatic, face symmetric, no Cushingoid features. EYES:  Glasses.  Blue eyes.  No conjunctivitis or scleral icterus.  NEUROLOGICAL: Unremarkable. PSYCH:  Appropriate.  No visits with results within 3 Day(s) from this visit. Latest known visit with results is:  Hospital Outpatient Visit on 02/23/2015  Component Date Value Ref Range Status  . SURGICAL PATHOLOGY 02/23/2015    Final                   Value:Surgical Pathology CASE: [id number] PATIENT: [facility] Surgical Pathology Report     SPECIMEN SUBMITTED: A. Omental mass  CLINICAL HISTORY: Omental mass  PRE-OPERATIVE DIAGNOSIS: None Provided  POST-OPERATIVE DIAGNOSIS: None provided     DIAGNOSIS: A. OMENTAL MASS;  BIOPSY: - METASTATIC HIGH-GRADE SEROUS CARCINOMA, CONSISTENT WITH GYNECOLOGIC ORIGIN (SEE NOTE).  Note: Immunohistochemical stains were performed for further characterization. The neoplasm is immunoreactive for PAX 8 and cytokeratin 7. Cytokeratin 20 and p53 are negative.  The staining pattern and morphologic characteristics support the diagnosis. The control slides stained appropriately.   GROSS DESCRIPTION: A. Labeled: Omental mass  Tissue fragment(s): 3  Size: 0.8-1.5 cm in length by 0.1 cm in diameter  Description: Tan-yellow cylindrically shaped tissue fragments  Entirely submitted in 2 cassette(s).     Final Diagnosis performed by Delorse Lek, MD.  Electronically                          signed 02/25/2015 3:30:32PM    The electronic signature indicates that the named Attending Pathologist has evaluated the specimen  Technical component performed at [facility] [address] [place], [place] [address] Lab: [phone] Dir: Darrick Penna. Evette Doffing, MD  Professional component performed at [facility], [facility], [place], [place], [place] [address] Lab: [phone] Dir: Dellia Nims. Rubinas, MD    . Glucose-Capillary 02/23/2015 176* 65 - 99 mg/dL Final  . Glucose-Capillary 02/23/2015 140* 65 - 99 mg/dL Final  . Glucose-Capillary 02/23/2015 23* 65 - 99 mg/dL Final  . Comment 1 02/23/2015 Repeat Test   Final    Assessment:  [name] is a 69 y.o. female with clinical stage IIIC (T3cN1Mx) ovarian cancer presenting with abdominal discomfort and bloating.  Omental biopsy on 02/23/2015 revealed metastatic high grade serous carcinoma, consistent with gynecologic origin.  CA125 was 707 on 02/17/2015.  Abdomen and pelvic CT scan on 02/13/2015 revealed bilateral mass-like adnexal regions (right adnexal mass 5.6 x 5.0 cm and the left adnexa mass 10.3 x 6.0 x 9.9 cm).  There was a large amount of soft tissue throughout the peritoneal cavity involving the  omentum and other peritoneal surfaces.  There was a small volume ascites. There was a peripheral 3.3 x 1.9 cm low-attenuation lesion overlying the right lobe of the liver , likely representing a serosal implant. There was 1.4 x 2.2 cm ill-defined peripheral lesion within the inferior aspect  of segment 6 adjacent to the ampulla in the duodenum.   She has severe rheumatoid arthritis and is on methotrexate and Enbrel.  She has a normocytic anemia.  Work-up on 02/17/2015 revealed a normal ferritin, B12, folate, TSH.  She denies any melena or hematochezia.  Symptomatically, she has abdominal bloating and intermittent diarrhea.  Plan: 1.  Review pathology.  Discuss neoadjuvant chemotherapy with carboplatin and Taxol x 3 cycles, followed by restaging studies, and 3 additional cycles of chemotherapy.  Side effects were reviewed.  Discuss Decadron premedications (IV versus oral).  Discuss issues with hyperglycemia.  Discuss sliding scale insulin.  Discuss timing of port-a-cath.. 2.  Discuss holding Enbrel.  Discuss consideration of discontinuation of MTX.  Discuss potential increased myelosuppression with MTX and chemotherapy.  Discuss use of Neulasta. 3.  Phone follow-up with Drs. [facility] and [place]. 4.  Stool for O & P, C diff. 5.  Rx:  Decadron 20 mg 12 hours and 6 hours prior to chemotherapy.  Dis: #30 with 1 refill. 6.  RTC on 03/05/2015 for MD assess, labs (CBC with diff, CMP, Mg), and cycle #1 carboplatin and Taxol.   Lequita Asal, MD  02/26/2015, 2:17 PM

## 2015-02-26 NOTE — Progress Notes (Signed)
No changes since last visit other than increase in BM's always real loose per pt.  Biopsy completed.

## 2015-02-27 ENCOUNTER — Other Ambulatory Visit: Payer: Self-pay

## 2015-02-27 DIAGNOSIS — K219 Gastro-esophageal reflux disease without esophagitis: Secondary | ICD-10-CM | POA: Diagnosis not present

## 2015-02-27 DIAGNOSIS — Z794 Long term (current) use of insulin: Secondary | ICD-10-CM | POA: Diagnosis not present

## 2015-02-27 DIAGNOSIS — I10 Essential (primary) hypertension: Secondary | ICD-10-CM | POA: Diagnosis not present

## 2015-02-27 DIAGNOSIS — E785 Hyperlipidemia, unspecified: Secondary | ICD-10-CM | POA: Diagnosis not present

## 2015-02-27 DIAGNOSIS — R197 Diarrhea, unspecified: Secondary | ICD-10-CM

## 2015-02-27 DIAGNOSIS — Z7982 Long term (current) use of aspirin: Secondary | ICD-10-CM | POA: Diagnosis not present

## 2015-02-27 DIAGNOSIS — Z79899 Other long term (current) drug therapy: Secondary | ICD-10-CM | POA: Diagnosis not present

## 2015-02-27 DIAGNOSIS — C786 Secondary malignant neoplasm of retroperitoneum and peritoneum: Secondary | ICD-10-CM | POA: Diagnosis present

## 2015-02-27 DIAGNOSIS — I5032 Chronic diastolic (congestive) heart failure: Secondary | ICD-10-CM | POA: Diagnosis not present

## 2015-02-27 DIAGNOSIS — C801 Malignant (primary) neoplasm, unspecified: Principal | ICD-10-CM

## 2015-02-27 DIAGNOSIS — C569 Malignant neoplasm of unspecified ovary: Secondary | ICD-10-CM | POA: Diagnosis not present

## 2015-02-27 LAB — C DIFFICILE QUICK SCREEN W PCR REFLEX
C Diff antigen: NEGATIVE
C Diff interpretation: NEGATIVE
C Diff toxin: NEGATIVE

## 2015-03-02 ENCOUNTER — Encounter: Payer: Self-pay | Admitting: Hematology and Oncology

## 2015-03-02 ENCOUNTER — Encounter
Admission: RE | Disposition: A | Discharge status: Home / Self Care | Payer: Self-pay | Source: Ambulatory Visit | Attending: Vascular Surgery

## 2015-03-02 ENCOUNTER — Ambulatory Visit
Admission: RE | Admit: 2015-03-02 | Discharge: 2015-03-02 | Disposition: A | Discharge status: Home / Self Care | Payer: Medicare Other | Source: Ambulatory Visit | Attending: Vascular Surgery | Admitting: Vascular Surgery

## 2015-03-02 ENCOUNTER — Encounter: Payer: Self-pay | Admitting: *Deleted

## 2015-03-02 DIAGNOSIS — I5032 Chronic diastolic (congestive) heart failure: Secondary | ICD-10-CM | POA: Insufficient documentation

## 2015-03-02 DIAGNOSIS — E785 Hyperlipidemia, unspecified: Secondary | ICD-10-CM | POA: Insufficient documentation

## 2015-03-02 DIAGNOSIS — Z794 Long term (current) use of insulin: Secondary | ICD-10-CM | POA: Insufficient documentation

## 2015-03-02 DIAGNOSIS — Z7982 Long term (current) use of aspirin: Secondary | ICD-10-CM | POA: Insufficient documentation

## 2015-03-02 DIAGNOSIS — K219 Gastro-esophageal reflux disease without esophagitis: Secondary | ICD-10-CM | POA: Insufficient documentation

## 2015-03-02 DIAGNOSIS — C786 Secondary malignant neoplasm of retroperitoneum and peritoneum: Secondary | ICD-10-CM | POA: Insufficient documentation

## 2015-03-02 DIAGNOSIS — I10 Essential (primary) hypertension: Secondary | ICD-10-CM | POA: Insufficient documentation

## 2015-03-02 DIAGNOSIS — C569 Malignant neoplasm of unspecified ovary: Secondary | ICD-10-CM | POA: Diagnosis not present

## 2015-03-02 DIAGNOSIS — Z79899 Other long term (current) drug therapy: Secondary | ICD-10-CM | POA: Insufficient documentation

## 2015-03-02 HISTORY — PX: PERIPHERAL VASCULAR CATHETERIZATION: SHX172C

## 2015-03-02 LAB — STOOL CULTURE

## 2015-03-02 SURGERY — PORTA CATH INSERTION
Anesthesia: Moderate Sedation

## 2015-03-02 MED ORDER — LIDOCAINE-EPINEPHRINE (PF) 1 %-1:200000 IJ SOLN
INTRAMUSCULAR | Status: AC
  Filled 2015-03-02: qty 30

## 2015-03-02 MED ORDER — DEXTROSE 5 % IV SOLN
1.5000 g | INTRAVENOUS | Status: AC
  Administered 2015-03-02: 1.5 g via INTRAVENOUS

## 2015-03-02 MED ORDER — ACETAMINOPHEN 325 MG PO TABS: 325.0000 mg | ORAL_TABLET | ORAL | Status: DC | PRN

## 2015-03-02 MED ORDER — MIDAZOLAM HCL 2 MG/2ML IJ SOLN
INTRAMUSCULAR | Status: DC | PRN
  Administered 2015-03-02: 3 mg via INTRAVENOUS

## 2015-03-02 MED ORDER — MORPHINE SULFATE (PF) 4 MG/ML IV SOLN: 2.0000 mg | INTRAVENOUS | Status: DC | PRN

## 2015-03-02 MED ORDER — LIDOCAINE-EPINEPHRINE (PF) 1 %-1:200000 IJ SOLN
INTRAMUSCULAR | Status: DC | PRN
  Administered 2015-03-02: 10 mL via INTRADERMAL

## 2015-03-02 MED ORDER — SODIUM CHLORIDE 0.9 % IV SOLN
500.0000 mL | Freq: Once | INTRAVENOUS | Status: DC | PRN
Start: 2015-03-02 — End: 2015-03-02

## 2015-03-02 MED ORDER — INSULIN ASPART 100 UNIT/ML ~~LOC~~ SOLN: 0.0000 [IU] | SUBCUTANEOUS | Status: DC

## 2015-03-02 MED ORDER — ONDANSETRON HCL 4 MG/2ML IJ SOLN: 4.0000 mg | Freq: Four times a day (QID) | INTRAMUSCULAR | Status: DC | PRN

## 2015-03-02 MED ORDER — DEXTROSE 5 % IV SOLN
INTRAVENOUS | Status: AC
Start: 2015-03-02 — End: 2015-03-02
  Filled 2015-03-02: qty 1.5

## 2015-03-02 MED ORDER — SODIUM CHLORIDE 0.9 % IR SOLN
Freq: Once | Status: DC
  Filled 2015-03-02: qty 2

## 2015-03-02 MED ORDER — OXYCODONE-ACETAMINOPHEN 5-325 MG PO TABS: 1.0000 | ORAL_TABLET | ORAL | Status: DC | PRN

## 2015-03-02 MED ORDER — METOPROLOL TARTRATE 1 MG/ML IV SOLN: 2.0000 mg | INTRAVENOUS | Status: DC | PRN

## 2015-03-02 MED ORDER — PHENOL 1.4 % MT LIQD
1.0000 | OROMUCOSAL | Status: DC | PRN
  Filled 2015-03-02: qty 177

## 2015-03-02 MED ORDER — SODIUM CHLORIDE 0.9 % IV SOLN
INTRAVENOUS | Status: DC
Start: 2015-03-02 — End: 2015-03-02
  Administered 2015-03-02: 08:00:00 via INTRAVENOUS

## 2015-03-02 MED ORDER — FENTANYL CITRATE (PF) 100 MCG/2ML IJ SOLN
INTRAMUSCULAR | Status: AC
  Filled 2015-03-02: qty 2

## 2015-03-02 MED ORDER — ACETAMINOPHEN 325 MG RE SUPP
325.0000 mg | RECTAL | Status: DC | PRN
  Filled 2015-03-02: qty 2

## 2015-03-02 MED ORDER — GUAIFENESIN-DM 100-10 MG/5ML PO SYRP
15.0000 mL | ORAL_SOLUTION | ORAL | Status: DC | PRN
  Filled 2015-03-02: qty 15

## 2015-03-02 MED ORDER — HEPARIN (PORCINE) IN NACL 2-0.9 UNIT/ML-% IJ SOLN
INTRAMUSCULAR | Status: AC
  Filled 2015-03-02: qty 500

## 2015-03-02 MED ORDER — MIDAZOLAM HCL 5 MG/5ML IJ SOLN
INTRAMUSCULAR | Status: AC
  Filled 2015-03-02: qty 5

## 2015-03-02 MED ORDER — ALUM & MAG HYDROXIDE-SIMETH 200-200-20 MG/5ML PO SUSP: 15.0000 mL | ORAL | Status: DC | PRN

## 2015-03-02 MED ORDER — FENTANYL CITRATE (PF) 100 MCG/2ML IJ SOLN
INTRAMUSCULAR | Status: DC | PRN
  Administered 2015-03-02: 100 ug via INTRAVENOUS

## 2015-03-02 MED ORDER — LABETALOL HCL 5 MG/ML IV SOLN: 10.0000 mg | INTRAVENOUS | Status: DC | PRN

## 2015-03-02 MED ORDER — CHLORHEXIDINE GLUCONATE CLOTH 2 % EX PADS: 6.0000 | MEDICATED_PAD | Freq: Once | CUTANEOUS | Status: DC

## 2015-03-02 MED ORDER — HYDRALAZINE HCL 20 MG/ML IJ SOLN: 5.0000 mg | INTRAMUSCULAR | Status: DC | PRN

## 2015-03-02 SURGICAL SUPPLY — 11 items
CANISTER SUCT 1200ML W/VALVE (MISCELLANEOUS) ×3 IMPLANT
DERMABOND ADVANCED (GAUZE/BANDAGES/DRESSINGS) ×2
DERMABOND ADVANCED .7 DNX12 (GAUZE/BANDAGES/DRESSINGS) ×1 IMPLANT
HANDLE YANKAUER SUCT BULB TIP (MISCELLANEOUS) ×3 IMPLANT
PACK ANGIOGRAPHY (CUSTOM PROCEDURE TRAY) ×3 IMPLANT
PAD GROUND ADULT SPLIT (MISCELLANEOUS) ×3 IMPLANT
PENCIL ELECTRO HAND CTR (MISCELLANEOUS) ×3 IMPLANT
PORTACATH POWER 8F (Port) ×3 IMPLANT
SYR TOOMEY 50ML (SYRINGE) ×3 IMPLANT
TUBING CONNECTING 10 (TUBING) ×4 IMPLANT
TUBING CONNECTING 10' (TUBING) ×2

## 2015-03-02 NOTE — Op Note (Signed)
      [facility]       Operative Note  Date: 03/02/2015  Preoperative diagnosis:  1. Ovarian cancer with peritoneal carcinomatosis  Postoperative diagnosis:  Same as above  Procedures: #1. Ultrasound guidance for vascular access to the right internal jugular vein. #2. Fluoroscopic guidance for placement of catheter. #3. Placement of CT compatible Port-A-Cath, right internal jugular vein.  Surgeon: Leotis Pain, MD.   Anesthesia: Local with moderate conscious sedation.  Fluoroscopy time: less than 1 minute  Contrast used: 0  Estimated blood loss: minimal  Indication for the procedure:  The patient has ovarian cancer with peritoneal carcinomatosis.  The patient needs a Port-A-Cath for durable venous access, chemotherapy, lab draws, and CT scans. We are asked to place this. Risks and benefits were discussed and informed consent was obtained.  Description of procedure: The patient was brought to the vascular and interventional radiology suite. The right neck chest and shoulder were sterilely prepped and draped, and a sterile surgical field was created. Ultrasound was used to help visualize a patent right internal jugular vein. This was then accessed under direct ultrasound guidance without difficulty with the Seldinger needle and a permanent image was recorded. A J-wire was placed. After skin nick and dilatation, the peel-away sheath was then placed over the wire. I then anesthetized an area under the clavicle approximately 1-2 fingerbreadths. A transverse incision was created and an inferior pocket was created with electrocautery and blunt dissection. The port was then brought onto the field, placed into the pocket and secured to the chest wall with 2 Prolene sutures. The catheter was connected to the port and tunneled from the subclavicular incision to the access site. Fluoroscopic guidance was then used to cut the catheter to an appropriate length. The catheter was then  placed through the peel-away sheath and the peel-away sheath was removed. The catheter tip was parked in excellent location under fluorocoscopic guidance in the cavoatrial junction. The pocket was then irrigated with antibiotic impregnated saline and the wound was closed with a running 3-0 Vicryl and a 4-0 Monocryl. The access incision was closed with a single 4-0 Monocryl. The Huber needle was used to withdraw blood and flush the port with heparinized saline. Dermabond was then placed as a dressing. The patient tolerated the procedure well and was taken to the recovery room in stable condition.   DEW,JASON 03/02/2015 10:10 AM

## 2015-03-02 NOTE — H&P (Signed)
   [facility] History & Physical Update  The patient was interviewed and re-examined.  The patient's previous History and Physical has been reviewed and is unchanged.  There is no change in the plan of care. We plan to proceed with the scheduled procedure.  DEW,JASON, MD  03/02/2015, 8:25 AM

## 2015-03-02 NOTE — Discharge Instructions (Signed)
The drugs you were given will stay in your system until tomorrow, so for the next 24 hours you should not.  Drive an automobile. Make any legal decisions.  Drink any alcoholic beverages. ° °Today you should start with liquids and gradually work up to solid foods as your are able to tolerate them ° °Resume your regular medications as prescribed by your doctor.  Avoid aspirin for 24 hours.   ° °Change the Band-Aid or dressing as needed.  After a 2 days no dressing as needed. ° °Avoid strenuous activity for the remainder of the day. ° °Please notify your primary physician immediately if you have any unusual bleeding, trouble breathing, fever >100 degrees or pain not relieved by the medication your doctor prescribed for your doctor prescribed for you physician ° °Return to diaslysis  tommorow. °

## 2015-03-03 LAB — OVA + PARASITE EXAM

## 2015-03-03 LAB — O&P RESULT

## 2015-03-03 NOTE — Progress Notes (Signed)
Performed post-op follow up phone call to pt. Pt denies unrelieved pain complications or return to emergency dept. 

## 2015-03-04 ENCOUNTER — Telehealth: Payer: Self-pay

## 2015-03-04 ENCOUNTER — Other Ambulatory Visit: Payer: Self-pay

## 2015-03-04 DIAGNOSIS — C801 Malignant (primary) neoplasm, unspecified: Principal | ICD-10-CM

## 2015-03-04 DIAGNOSIS — C786 Secondary malignant neoplasm of retroperitoneum and peritoneum: Secondary | ICD-10-CM

## 2015-03-04 MED ORDER — LIDOCAINE-PRILOCAINE 2.5-2.5 % EX CREA: 1.0000 "application " | TOPICAL_CREAM | CUTANEOUS | Status: DC | PRN

## 2015-03-04 NOTE — Telephone Encounter (Signed)
Called pt and let her know EMLA cream was called in.  Pt verbalized an understanding and already had instructions in her information book.

## 2015-03-04 NOTE — Telephone Encounter (Signed)
Called pt per MD to ensure pt had gotten and filled prescription for Decadron.  Pt verbalized that yes she has her prescription and verbalizes an understanding for taking the decadron on Wednesday night before Thursday Chemo.

## 2015-03-05 ENCOUNTER — Inpatient Hospital Stay: Payer: Medicare Other | Attending: Hematology and Oncology

## 2015-03-05 ENCOUNTER — Inpatient Hospital Stay: Payer: Medicare Other

## 2015-03-05 ENCOUNTER — Inpatient Hospital Stay (HOSPITAL_BASED_OUTPATIENT_CLINIC_OR_DEPARTMENT_OTHER): Payer: Medicare Other | Admitting: Hematology and Oncology

## 2015-03-05 ENCOUNTER — Telehealth: Payer: Self-pay

## 2015-03-05 VITALS — BP 154/72 | HR 77 | Temp 97.8°F | Resp 17 | Ht 63.0 in | Wt 173.8 lb

## 2015-03-05 VITALS — BP 146/71 | HR 82 | Temp 98.0°F | Resp 18

## 2015-03-05 DIAGNOSIS — Z8701 Personal history of pneumonia (recurrent): Secondary | ICD-10-CM | POA: Insufficient documentation

## 2015-03-05 DIAGNOSIS — I10 Essential (primary) hypertension: Secondary | ICD-10-CM

## 2015-03-05 DIAGNOSIS — E785 Hyperlipidemia, unspecified: Secondary | ICD-10-CM | POA: Insufficient documentation

## 2015-03-05 DIAGNOSIS — R140 Abdominal distension (gaseous): Secondary | ICD-10-CM | POA: Diagnosis not present

## 2015-03-05 DIAGNOSIS — C786 Secondary malignant neoplasm of retroperitoneum and peritoneum: Secondary | ICD-10-CM | POA: Insufficient documentation

## 2015-03-05 DIAGNOSIS — I5032 Chronic diastolic (congestive) heart failure: Secondary | ICD-10-CM | POA: Diagnosis not present

## 2015-03-05 DIAGNOSIS — E109 Type 1 diabetes mellitus without complications: Secondary | ICD-10-CM

## 2015-03-05 DIAGNOSIS — R0602 Shortness of breath: Secondary | ICD-10-CM

## 2015-03-05 DIAGNOSIS — Z7982 Long term (current) use of aspirin: Secondary | ICD-10-CM | POA: Diagnosis not present

## 2015-03-05 DIAGNOSIS — Z79899 Other long term (current) drug therapy: Secondary | ICD-10-CM | POA: Diagnosis not present

## 2015-03-05 DIAGNOSIS — I252 Old myocardial infarction: Secondary | ICD-10-CM

## 2015-03-05 DIAGNOSIS — R188 Other ascites: Secondary | ICD-10-CM | POA: Insufficient documentation

## 2015-03-05 DIAGNOSIS — Z87891 Personal history of nicotine dependence: Secondary | ICD-10-CM | POA: Diagnosis not present

## 2015-03-05 DIAGNOSIS — E876 Hypokalemia: Secondary | ICD-10-CM | POA: Insufficient documentation

## 2015-03-05 DIAGNOSIS — M069 Rheumatoid arthritis, unspecified: Secondary | ICD-10-CM | POA: Diagnosis not present

## 2015-03-05 DIAGNOSIS — C561 Malignant neoplasm of right ovary: Secondary | ICD-10-CM | POA: Insufficient documentation

## 2015-03-05 DIAGNOSIS — C562 Malignant neoplasm of left ovary: Secondary | ICD-10-CM

## 2015-03-05 DIAGNOSIS — K8020 Calculus of gallbladder without cholecystitis without obstruction: Secondary | ICD-10-CM | POA: Diagnosis not present

## 2015-03-05 DIAGNOSIS — D72819 Decreased white blood cell count, unspecified: Secondary | ICD-10-CM

## 2015-03-05 DIAGNOSIS — I2510 Atherosclerotic heart disease of native coronary artery without angina pectoris: Secondary | ICD-10-CM | POA: Insufficient documentation

## 2015-03-05 DIAGNOSIS — C801 Malignant (primary) neoplasm, unspecified: Principal | ICD-10-CM

## 2015-03-05 DIAGNOSIS — Z5111 Encounter for antineoplastic chemotherapy: Secondary | ICD-10-CM | POA: Diagnosis not present

## 2015-03-05 DIAGNOSIS — K219 Gastro-esophageal reflux disease without esophagitis: Secondary | ICD-10-CM | POA: Insufficient documentation

## 2015-03-05 DIAGNOSIS — Z794 Long term (current) use of insulin: Secondary | ICD-10-CM

## 2015-03-05 DIAGNOSIS — G629 Polyneuropathy, unspecified: Secondary | ICD-10-CM | POA: Diagnosis not present

## 2015-03-05 LAB — COMPREHENSIVE METABOLIC PANEL
ALT: 20 U/L (ref 14–54)
AST: 35 U/L (ref 15–41)
Albumin: 3 g/dL — ABNORMAL LOW (ref 3.5–5.0)
Alkaline Phosphatase: 78 U/L (ref 38–126)
Anion gap: 9 (ref 5–15)
BUN: 23 mg/dL — ABNORMAL HIGH (ref 6–20)
CO2: 25 mmol/L (ref 22–32)
Calcium: 7.4 mg/dL — ABNORMAL LOW (ref 8.9–10.3)
Chloride: 98 mmol/L — ABNORMAL LOW (ref 101–111)
Creatinine, Ser: 0.89 mg/dL (ref 0.44–1.00)
GFR calc Af Amer: >60 mL/min (ref >60)
GFR calc non Af Amer: >60 mL/min (ref >60)
Glucose, Bld: 346 mg/dL — ABNORMAL HIGH (ref 65–99)
Potassium: 3.6 mmol/L (ref 3.5–5.1)
Sodium: 132 mmol/L — ABNORMAL LOW (ref 135–145)
Total Bilirubin: 0.5 mg/dL (ref 0.3–1.2)
Total Protein: 6.6 g/dL (ref 6.5–8.1)

## 2015-03-05 LAB — CBC WITH DIFFERENTIAL/PLATELET
Basophils Absolute: 0 10*3/uL (ref 0–0.1)
Basophils Relative: 0 %
Eosinophils Absolute: 0 10*3/uL (ref 0–0.7)
Eosinophils Relative: 0 %
HCT: 31.4 % — ABNORMAL LOW (ref 35.0–47.0)
Hemoglobin: 10.6 g/dL — ABNORMAL LOW (ref 12.0–16.0)
Lymphocytes Relative: 3 %
Lymphs Abs: 0.3 10*3/uL — ABNORMAL LOW (ref 1.0–3.6)
MCH: 29.5 pg (ref 26.0–34.0)
MCHC: 33.8 g/dL (ref 32.0–36.0)
MCV: 87.3 fL (ref 80.0–100.0)
Monocytes Absolute: 0.1 10*3/uL — ABNORMAL LOW (ref 0.2–0.9)
Monocytes Relative: 1 %
Neutro Abs: 8.5 10*3/uL — ABNORMAL HIGH (ref 1.4–6.5)
Neutrophils Relative %: 96 %
Platelets: 375 10*3/uL (ref 150–440)
RBC: 3.6 MIL/uL — ABNORMAL LOW (ref 3.80–5.20)
RDW: 14.2 % (ref 11.5–14.5)
WBC: 8.8 10*3/uL (ref 3.6–11.0)

## 2015-03-05 LAB — MAGNESIUM: Magnesium: 1.5 mg/dL — ABNORMAL LOW (ref 1.7–2.4)

## 2015-03-05 MED ORDER — HEPARIN SOD (PORK) LOCK FLUSH 100 UNIT/ML IV SOLN
500.0000 [IU] | Freq: Once | INTRAVENOUS | Status: AC
  Administered 2015-03-05: 500 [IU] via INTRAVENOUS

## 2015-03-05 MED ORDER — MAGNESIUM SULFATE 50 % IJ SOLN
1.0000 g | Freq: Once | INTRAVENOUS | Status: AC
  Administered 2015-03-05: 1 g via INTRAVENOUS
  Filled 2015-03-05: qty 2

## 2015-03-05 MED ORDER — SODIUM CHLORIDE 0.9 % IV SOLN
431.5000 mg | Freq: Once | INTRAVENOUS | Status: AC
  Administered 2015-03-05: 430 mg via INTRAVENOUS
  Filled 2015-03-05: qty 43

## 2015-03-05 MED ORDER — FAMOTIDINE IN NACL 20-0.9 MG/50ML-% IV SOLN
20.0000 mg | Freq: Once | INTRAVENOUS | Status: AC
  Administered 2015-03-05: 20 mg via INTRAVENOUS
  Filled 2015-03-05: qty 50

## 2015-03-05 MED ORDER — PACLITAXEL CHEMO INJECTION 300 MG/50ML
175.0000 mg/m2 | Freq: Once | INTRAVENOUS | Status: DC
  Filled 2015-03-05: qty 53

## 2015-03-05 MED ORDER — DIPHENHYDRAMINE HCL 50 MG/ML IJ SOLN
50.0000 mg | Freq: Once | INTRAMUSCULAR | Status: AC
  Administered 2015-03-05: 50 mg via INTRAVENOUS
  Filled 2015-03-05: qty 1

## 2015-03-05 MED ORDER — PACLITAXEL CHEMO INJECTION 300 MG/50ML
175.0000 mg/m2 | Freq: Once | INTRAVENOUS | Status: AC
  Administered 2015-03-05: 318 mg via INTRAVENOUS
  Filled 2015-03-05: qty 53

## 2015-03-05 MED ORDER — SODIUM CHLORIDE 0.9 % IJ SOLN
10.0000 mL | INTRAMUSCULAR | Status: DC | PRN
  Administered 2015-03-05: 10 mL via INTRAVENOUS
  Filled 2015-03-05: qty 10

## 2015-03-05 MED ORDER — SODIUM CHLORIDE 0.9 % IV SOLN
Freq: Once | INTRAVENOUS | Status: AC
  Administered 2015-03-05: 11:00:00 via INTRAVENOUS
  Filled 2015-03-05: qty 1000

## 2015-03-05 MED ORDER — ONDANSETRON HCL 40 MG/20ML IJ SOLN
Freq: Once | INTRAMUSCULAR | Status: AC
  Administered 2015-03-05: 13:00:00 via INTRAVENOUS
  Filled 2015-03-05: qty 8

## 2015-03-05 NOTE — Progress Notes (Signed)
Pt reports she thinks she has had an increase sob since last visit.  Pt's Rheumatology  increased lasix yesterday to 40mg  TID and may not continue more or less it  Is as a as needed basis.

## 2015-03-05 NOTE — Telephone Encounter (Signed)
Spoke with pt in infusion per MD, pt needs to start taking 500mg  Calcium BID. Pt verbalizes an understanding and wrote it down on a piece of Paper

## 2015-03-06 ENCOUNTER — Encounter: Payer: Self-pay | Admitting: Hematology and Oncology

## 2015-03-06 MED ORDER — LORAZEPAM 0.5 MG PO TABS: 0.5000 mg | ORAL_TABLET | Freq: Four times a day (QID) | ORAL | Status: DC | PRN

## 2015-03-06 MED ORDER — ONDANSETRON HCL 8 MG PO TABS: 8.0000 mg | ORAL_TABLET | Freq: Three times a day (TID) | ORAL | Status: DC | PRN

## 2015-03-06 NOTE — Progress Notes (Signed)
[facility] day:  03/05/2015  Chief Complaint: [name] is a 69 y.o. female with bilateral ovarian masses with associated peritoneal metastatic disease who is seen for assessment prior to cycle #1 carboplatin and Taxol.  HPI:  The patient was last seen in the medical oncology clinic on 02/26/2015.  At that time, omental biopsy from 02/23/2015 had revealed metastatic high grade serous carcinoma, consistent with gynecologic origin.  CA125 was 707 on 02/17/2015.  She had met with Dr. Theora Gianotti on 02/25/2015.  Neoadjuvant chemotherapy x 3 cycles was recommended, followed by reimaging then likely surgery, followed by 3 additional cycles of chemotherapy.   She attended the chemotherapy class.    During the interim, I spoke with Dr. Jefm Bryant and Dr Gabriel Carina.  Her methotrexatre and Enbrel were discontinued.  Plan was for possible joint injection if isolated joint problems or low dose prednisone if needed.  Dr Gabriel Carina recommended a change in her insulin pump settings based on her Decadron premedications.  She underwent port-a-cath placement on 03/02/2015.    Symptomatically, she is doing well.  She notes a little shortness of breath caused by her abdominal distention.  Her blood sugar was 222 this morning.   Past Medical History  Diagnosis Date  . Leukopenia 2012    s/p bone marrow biopsy, Dr. Ma Hillock  . Cholelithiasis   . GERD (gastroesophageal reflux disease)   . Hypertension   . Hyperlipidemia   . Esophageal stricture   . Chronic diastolic CHF (congestive heart failure) ([place])   . CAD (coronary artery disease)     a. 09/2012 Cath: LM nl, LAD 95p, 3m LCX 927mOM2 50, RCA 100.  . Marland Kitchenypokalemia   . Herniated disc   . Pneumonia 2013; 08/2012    "one lung; double" (10/18/2012)  . Exertional shortness of breath   . Type I diabetes mellitus ([facility]    "dx'd in 1957" (10/18/2012)  . History of pancytopenia   . H/O hiatal hernia   . Rheumatoid arthritis(714.0)    . NSTEMI (non-ST elevated myocardial infarction) ([facility]/2014    "mild" (10/18/2012)    Past Surgical History  Procedure Laterality Date  . Esophagogastroduodenoscopy  2012    Dr. IfMinna Merritts. Tubal ligation  1970  . Cataract extraction w/ intraocular lens implant Right 2010  . Cardiac catheterization  10/18/2012    "first one was today" (10/18/2012)  . Esophageal dilation      "3 or 4 times" (10/19/2011)  . Coronary artery bypass graft N/A 10/19/2012    Procedure: CORONARY ARTERY BYPASS GRAFTING (CABG);  Surgeon: StMelrose NakayamaMD;  [facility];  Laterality: N/A;  . Peripheral vascular catheterization N/A 03/02/2015    Procedure: Porta Cath Insertion;  Surgeon: JaAlgernon HuxleyMD;  [facility];  Service: Cardiovascular;  Laterality: N/A;    Family History  Problem Relation Age of Onset  . Diabetes Mother   . Arthritis Mother   . Diabetes Father   . Arthritis Father   . Bone cancer Sister     Social History:  reports that she quit smoking about 20 years ago. Her smoking use included Cigarettes. She has a 30 pack-year smoking history. She has never used smokeless tobacco. She reports that she does not drink alcohol or use illicit drugs.  The patient is accompanied by her husband today.  Allergies:  Allergies  Allergen Reactions  . Codeine  nausea    Current Medications: Current Outpatient Prescriptions  Medication Sig Dispense Refill  . amLODipine (NORVASC) 5 MG tablet Take 1 tablet (5 mg total) by mouth daily. 90 tablet 1  . aspirin 81 MG tablet Take 81 mg by mouth daily.    Marland Kitchen atorvastatin (LIPITOR) 80 MG tablet Take 1 tablet (80 mg total) by mouth daily. 90 tablet 3  . Cholecalciferol (VITAMIN D) 2000 UNITS CAPS Take 1 capsule by mouth daily.    Marland Kitchen dexamethasone (DECADRON) 4 MG tablet 20 mg (5 tablets) 12 hours and 6 hours prior to chemotherapy 30 tablet 1  . ezetimibe (ZETIA) 10 MG tablet Take 1 tablet (10 mg total)  by mouth daily. 30 tablet 11  . folic acid (FOLVITE) 1 MG tablet Take 1 tablet (1 mg total) by mouth daily. 90 tablet 3  . furosemide (LASIX) 40 MG tablet Take 1 tablet (40 mg total) by mouth 2 (two) times daily. (Patient taking differently: Take 40 mg by mouth 2 (two) times daily. ) 180 tablet 3  . ibuprofen (ADVIL,MOTRIN) 800 MG tablet Take 1 tablet (800 mg total) by mouth 3 (three) times daily as needed. 270 tablet 3  . insulin lispro (HUMALOG) 100 UNIT/ML injection As directed via Insulin pump 30 mL 5  . lidocaine-prilocaine (EMLA) cream Apply 1 application topically as needed. Apply application 1 hour prior to treatment.  Cover with Press n seal until treatment time. 30 g 1  . loratadine (CLARITIN) 10 MG tablet Take 10 mg by mouth daily.    Marland Kitchen losartan (COZAAR) 100 MG tablet Take 1 tablet (100 mg total) by mouth daily. 90 tablet 3  . metoprolol tartrate (LOPRESSOR) 25 MG tablet Take 1 tablet (25 mg total) by mouth 2 (two) times daily as needed. 180 tablet 3  . Multiple Vitamin (MULTIVITAMIN) tablet Take 1 tablet by mouth daily.      . pantoprazole (PROTONIX) 40 MG tablet Take 1 tablet (40 mg total) by mouth 2 (two) times daily. (Patient taking differently: Take 40 mg by mouth daily. ) 180 tablet 3  . ranitidine (ZANTAC) 150 MG tablet Take 1 tablet (150 mg total) by mouth 2 (two) times daily. 180 tablet 2  . saccharomyces boulardii (FLORASTOR) 250 MG capsule Take 250 mg by mouth as needed.    . zoledronic acid (RECLAST) 5 MG/100ML SOLN Inject 5 mg into the vein once.    . etanercept (ENBREL) 25 MG injection Inject 25 mg into the skin once a week.    Marland Kitchen LORazepam (ATIVAN) 0.5 MG tablet Take 1 tablet (0.5 mg total) by mouth every 6 (six) hours as needed (nausea). 30 tablet 0  . methotrexate (RHEUMATREX) 2.5 MG tablet Take 4 tablets (10 mg total) by mouth once a week. Takes every Wednesday.Caution:Chemotherapy. Protect from light. (Patient not taking: Reported on 03/05/2015) 64 tablet 3  . ondansetron  (ZOFRAN) 8 MG tablet Take 1 tablet (8 mg total) by mouth every 8 (eight) hours as needed for nausea or vomiting. 20 tablet 1   No current facility-administered medications for this visit.    Review of Systems:  GENERAL:  Doing ok.  No fevers, sweats or weight loss. PERFORMANCE STATUS (ECOG):  1 HEENT:  No visual changes, sore throat, mouth sores or tenderness. Lungs: Mild shortness of breath secondary to abdominal distention.  Nocough.  No hemoptysis. Cardiac:  No chest pain, palpitations, orthopnea, or PND. GI:  Bloating.  Daily bowel movements.  No nausea, vomiting, constipation, melena or hematochezia. GU:  No urgency, frequency, dysuria, or hematuria. Musculoskeletal:  Severe rheumatoid arthritis.  Osteoporosis.  No back pain. No muscle tenderness. Extremities:  No pain or swelling. Skin:  No rashes or skin changes. Neuro:  No headache, numbness or weakness, balance or coordination issues. Endocrine:  Diabetes.  No thyroid issues, hot flashes or night sweats. Psych:  No mood changes, depression or anxiety. Pain:  No focal pain. Review of systems:  All other systems reviewed and found to be negative.  Physical Exam: Blood pressure 154/72, pulse 77, temperature 97.8 F (36.6 C), temperature source Tympanic, resp. rate 17, height 5' 3"  (1.6 m), weight 173 lb 13.3 oz (78.85 kg). GENERAL:  Well developed, well nourished, sitting comfortably in the exam room in no acute distress. MENTAL STATUS:  Alert and oriented to person, place and time. HEAD:  Short styled gray hair.  Normocephalic, atraumatic, face symmetric, no Cushingoid features. EYES:  Glasses.  Blue eyes.  No conjunctivitis or scleral icterus.  ENT: Oropharynx clear without lesion. Tongue normal. Mucous membranes moist.  RESPIRATORY: Clear to auscultation without rales, wheezes or rhonchi. CARDIOVASCULAR: Regular rate and rhythm without murmur, rub or gallop. CHEST:  Unremarkable port-a-cath. ABDOMEN: Abdominal  distension with ascites.  Soft, non-tender with active bowel sounds and no appreciable hepatosplenomegaly. No palpable nodularity or masses. SKIN: SQ hematoma on abdominal wall from old insulin pump site.  No rashes, ulcers or lesions. EXTREMITIES: 2+ lower extremity edema (right > left).  No skin discoloration or tenderness. No palpable cords. LYMPH NODES: No palpable cervical, supraclavicular, axillary or inguinal adenopathy  NEUROLOGICAL: Unremarkable. PSYCH: Appropriate.  Infusion on 03/05/2015  Component Date Value Ref Range Status  . WBC 03/05/2015 8.8  3.6 - 11.0 K/uL Final  . RBC 03/05/2015 3.60* 3.80 - 5.20 MIL/uL Final  . Hemoglobin 03/05/2015 10.6* 12.0 - 16.0 g/dL Final  . HCT 03/05/2015 31.4* 35.0 - 47.0 % Final  . MCV 03/05/2015 87.3  80.0 - 100.0 fL Final  . MCH 03/05/2015 29.5  26.0 - 34.0 pg Final  . MCHC 03/05/2015 33.8  32.0 - 36.0 g/dL Final  . RDW 03/05/2015 14.2  11.5 - 14.5 % Final  . Platelets 03/05/2015 375  150 - 440 K/uL Final  . Neutrophils Relative % 03/05/2015 96   Final  . Neutro Abs 03/05/2015 8.5* 1.4 - 6.5 K/uL Final  . Lymphocytes Relative 03/05/2015 3   Final  . Lymphs Abs 03/05/2015 0.3* 1.0 - 3.6 K/uL Final  . Monocytes Relative 03/05/2015 1   Final  . Monocytes Absolute 03/05/2015 0.1* 0.2 - 0.9 K/uL Final  . Eosinophils Relative 03/05/2015 0   Final  . Eosinophils Absolute 03/05/2015 0.0  0 - 0.7 K/uL Final  . Basophils Relative 03/05/2015 0   Final  . Basophils Absolute 03/05/2015 0.0  0 - 0.1 K/uL Final  . Sodium 03/05/2015 132* 135 - 145 mmol/L Final  . Potassium 03/05/2015 3.6  3.5 - 5.1 mmol/L Final  . Chloride 03/05/2015 98* 101 - 111 mmol/L Final  . CO2 03/05/2015 25  22 - 32 mmol/L Final  . Glucose, Bld 03/05/2015 346* 65 - 99 mg/dL Final  . BUN 03/05/2015 23* 6 - 20 mg/dL Final  . Creatinine, Ser 03/05/2015 0.89  0.44 - 1.00 mg/dL Final  . Calcium 03/05/2015 7.4* 8.9 - 10.3 mg/dL Final  . Total Protein 03/05/2015 6.6  6.5 - 8.1  g/dL Final  . Albumin 03/05/2015 3.0* 3.5 - 5.0 g/dL Final  . AST 03/05/2015 35  15 - 41 U/L  Final  . ALT 03/05/2015 20  14 - 54 U/L Final  . Alkaline Phosphatase 03/05/2015 78  38 - 126 U/L Final  . Total Bilirubin 03/05/2015 0.5  0.3 - 1.2 mg/dL Final  . GFR calc non Af Amer 03/05/2015 >60  >60 mL/min Final  . GFR calc Af Amer 03/05/2015 >60  >60 mL/min Final   Comment: (NOTE) The eGFR has been calculated using the CKD EPI equation. This calculation has not been validated in all clinical situations. eGFR's persistently <60 mL/min signify possible Chronic Kidney Disease.   . Anion gap 03/05/2015 9  5 - 15 Final  . Magnesium 03/05/2015 1.5* 1.7 - 2.4 mg/dL Final    Assessment:  [name] is a 69 y.o. female with clinical stage IIIC (T3cN1Mx) ovarian cancer presenting with abdominal discomfort and bloating.  Omental biopsy on 02/23/2015 revealed metastatic high grade serous carcinoma, consistent with gynecologic origin.  CA125 was 707 on 02/17/2015.  Abdomen and pelvic CT scan on 02/13/2015 revealed bilateral mass-like adnexal regions (right adnexal mass 5.6 x 5.0 cm and the left adnexa mass 10.3 x 6.0 x 9.9 cm).  There was a large amount of soft tissue throughout the peritoneal cavity involving the omentum and other peritoneal surfaces.  There was a small volume ascites. There was a peripheral 3.3 x 1.9 cm low-attenuation lesion overlying the right lobe of the liver , likely representing a serosal implant. There was 1.4 x 2.2 cm ill-defined peripheral lesion within the inferior aspect of segment 6 adjacent to the ampulla in the duodenum.   She has severe rheumatoid arthritis.  Methotrexate and Enbrel are on hold.  She has a normocytic anemia.  Work-up on 02/17/2015 revealed a normal ferritin, B12, folate, TSH.  She denies any melena or hematochezia.  She has diabetes and is on an insulin pump.  Symptomatically, she has abdominal bloating and mild shortness of breath.  Exam reveals  abdominal distention with ascites and lower extremity edema.  She takes Lasix.  She has mild hypomagnesemia.  Plan: 1.  Labs today:  CBC with diff, CMP, Mg. 2.  Review recent follow-up with rheumatology and endocrinology. 3.  Re-review chemotherapy plan and associated side effects.  Discuss nadir counts in 10-14 days.  Discuss Neulasta if counts drop. 4.  Cycle #1 carboplatin and Taxol today. 5.  Magnesium 1 gm IV today. 6.  RTC on 03/16/2015 for MD nadir assessment and labs (CBC with diff, BMP, Mg). 7.  RTC on 03/30/2015 for MD assess, labs (CBC with diff, CMP, Mg, CA125), and cycle #2 carboplatin and Taxol.   Lequita Asal, MD  03/05/2015

## 2015-03-16 ENCOUNTER — Inpatient Hospital Stay: Payer: Medicare Other

## 2015-03-16 DIAGNOSIS — C562 Malignant neoplasm of left ovary: Secondary | ICD-10-CM | POA: Diagnosis not present

## 2015-03-16 LAB — MAGNESIUM: Magnesium: 1.5 mg/dL — ABNORMAL LOW (ref 1.7–2.4)

## 2015-03-23 ENCOUNTER — Inpatient Hospital Stay: Payer: Medicare Other

## 2015-03-24 ENCOUNTER — Other Ambulatory Visit: Payer: Self-pay

## 2015-03-24 DIAGNOSIS — C786 Secondary malignant neoplasm of retroperitoneum and peritoneum: Secondary | ICD-10-CM

## 2015-03-24 DIAGNOSIS — C801 Malignant (primary) neoplasm, unspecified: Principal | ICD-10-CM

## 2015-03-30 ENCOUNTER — Inpatient Hospital Stay: Payer: Medicare Other

## 2015-03-30 ENCOUNTER — Ambulatory Visit: Payer: Medicare Other

## 2015-03-30 ENCOUNTER — Inpatient Hospital Stay (HOSPITAL_BASED_OUTPATIENT_CLINIC_OR_DEPARTMENT_OTHER): Payer: Medicare Other | Admitting: Hematology and Oncology

## 2015-03-30 ENCOUNTER — Other Ambulatory Visit: Payer: Self-pay | Admitting: Hematology and Oncology

## 2015-03-30 VITALS — BP 159/71 | HR 83 | Temp 96.7°F | Resp 18 | Ht 63.0 in | Wt 166.7 lb

## 2015-03-30 DIAGNOSIS — G629 Polyneuropathy, unspecified: Secondary | ICD-10-CM

## 2015-03-30 DIAGNOSIS — C786 Secondary malignant neoplasm of retroperitoneum and peritoneum: Secondary | ICD-10-CM

## 2015-03-30 DIAGNOSIS — K8020 Calculus of gallbladder without cholecystitis without obstruction: Secondary | ICD-10-CM

## 2015-03-30 DIAGNOSIS — Z7982 Long term (current) use of aspirin: Secondary | ICD-10-CM

## 2015-03-30 DIAGNOSIS — C801 Malignant (primary) neoplasm, unspecified: Principal | ICD-10-CM

## 2015-03-30 DIAGNOSIS — C561 Malignant neoplasm of right ovary: Secondary | ICD-10-CM

## 2015-03-30 DIAGNOSIS — E876 Hypokalemia: Secondary | ICD-10-CM

## 2015-03-30 DIAGNOSIS — Z794 Long term (current) use of insulin: Secondary | ICD-10-CM

## 2015-03-30 DIAGNOSIS — E109 Type 1 diabetes mellitus without complications: Secondary | ICD-10-CM

## 2015-03-30 DIAGNOSIS — I2510 Atherosclerotic heart disease of native coronary artery without angina pectoris: Secondary | ICD-10-CM

## 2015-03-30 DIAGNOSIS — D72819 Decreased white blood cell count, unspecified: Secondary | ICD-10-CM

## 2015-03-30 DIAGNOSIS — E785 Hyperlipidemia, unspecified: Secondary | ICD-10-CM

## 2015-03-30 DIAGNOSIS — Z8701 Personal history of pneumonia (recurrent): Secondary | ICD-10-CM

## 2015-03-30 DIAGNOSIS — R188 Other ascites: Secondary | ICD-10-CM

## 2015-03-30 DIAGNOSIS — R600 Localized edema: Secondary | ICD-10-CM | POA: Insufficient documentation

## 2015-03-30 DIAGNOSIS — I252 Old myocardial infarction: Secondary | ICD-10-CM

## 2015-03-30 DIAGNOSIS — C562 Malignant neoplasm of left ovary: Secondary | ICD-10-CM

## 2015-03-30 DIAGNOSIS — K219 Gastro-esophageal reflux disease without esophagitis: Secondary | ICD-10-CM

## 2015-03-30 DIAGNOSIS — Z87891 Personal history of nicotine dependence: Secondary | ICD-10-CM

## 2015-03-30 DIAGNOSIS — R140 Abdominal distension (gaseous): Secondary | ICD-10-CM

## 2015-03-30 DIAGNOSIS — M069 Rheumatoid arthritis, unspecified: Secondary | ICD-10-CM

## 2015-03-30 DIAGNOSIS — I10 Essential (primary) hypertension: Secondary | ICD-10-CM

## 2015-03-30 DIAGNOSIS — Z79899 Other long term (current) drug therapy: Secondary | ICD-10-CM

## 2015-03-30 DIAGNOSIS — R0602 Shortness of breath: Secondary | ICD-10-CM

## 2015-03-30 DIAGNOSIS — I5032 Chronic diastolic (congestive) heart failure: Secondary | ICD-10-CM

## 2015-03-30 LAB — MAGNESIUM: Magnesium: 1.6 mg/dL — ABNORMAL LOW (ref 1.7–2.4)

## 2015-03-30 LAB — COMPREHENSIVE METABOLIC PANEL
ALT: 15 U/L (ref 14–54)
AST: 23 U/L (ref 15–41)
Albumin: 3.1 g/dL — ABNORMAL LOW (ref 3.5–5.0)
Alkaline Phosphatase: 68 U/L (ref 38–126)
Anion gap: 10 (ref 5–15)
BUN: 18 mg/dL (ref 6–20)
CO2: 26 mmol/L (ref 22–32)
Calcium: 8.5 mg/dL — ABNORMAL LOW (ref 8.9–10.3)
Chloride: 95 mmol/L — ABNORMAL LOW (ref 101–111)
Creatinine, Ser: 0.86 mg/dL (ref 0.44–1.00)
GFR calc Af Amer: >60 mL/min (ref >60)
GFR calc non Af Amer: >60 mL/min (ref >60)
Glucose, Bld: 379 mg/dL — ABNORMAL HIGH (ref 65–99)
Potassium: 3.6 mmol/L (ref 3.5–5.1)
Sodium: 131 mmol/L — ABNORMAL LOW (ref 135–145)
Total Bilirubin: 0.7 mg/dL (ref 0.3–1.2)
Total Protein: 7 g/dL (ref 6.5–8.1)

## 2015-03-30 LAB — CBC WITH DIFFERENTIAL/PLATELET
Basophils Absolute: 0 10*3/uL (ref 0–0.1)
Basophils Relative: 0 %
Eosinophils Absolute: 0 10*3/uL (ref 0–0.7)
Eosinophils Relative: 0 %
HCT: 31.3 % — ABNORMAL LOW (ref 35.0–47.0)
Hemoglobin: 10.3 g/dL — ABNORMAL LOW (ref 12.0–16.0)
Lymphocytes Relative: 5 %
Lymphs Abs: 0.3 10*3/uL — ABNORMAL LOW (ref 1.0–3.6)
MCH: 28.3 pg (ref 26.0–34.0)
MCHC: 33.1 g/dL (ref 32.0–36.0)
MCV: 85.5 fL (ref 80.0–100.0)
Monocytes Absolute: 0.1 10*3/uL — ABNORMAL LOW (ref 0.2–0.9)
Monocytes Relative: 2 %
Neutro Abs: 5.6 10*3/uL (ref 1.4–6.5)
Neutrophils Relative %: 93 %
Platelets: 301 10*3/uL (ref 150–440)
RBC: 3.66 MIL/uL — ABNORMAL LOW (ref 3.80–5.20)
RDW: 16.5 % — ABNORMAL HIGH (ref 11.5–14.5)
WBC: 6 10*3/uL (ref 3.6–11.0)

## 2015-03-30 MED ORDER — HEPARIN SOD (PORK) LOCK FLUSH 100 UNIT/ML IV SOLN
500.0000 [IU] | Freq: Once | INTRAVENOUS | Status: AC | PRN
  Administered 2015-03-30: 500 [IU]
  Filled 2015-03-30: qty 5

## 2015-03-30 MED ORDER — DIPHENHYDRAMINE HCL 50 MG/ML IJ SOLN
50.0000 mg | Freq: Once | INTRAMUSCULAR | Status: AC
  Administered 2015-03-30: 50 mg via INTRAVENOUS
  Filled 2015-03-30: qty 1

## 2015-03-30 MED ORDER — SODIUM CHLORIDE 0.9 % IV SOLN
Freq: Once | INTRAVENOUS | Status: AC
  Administered 2015-03-30: 12:00:00 via INTRAVENOUS
  Filled 2015-03-30: qty 1000

## 2015-03-30 MED ORDER — PACLITAXEL CHEMO INJECTION 300 MG/50ML: 175.0000 mg/m2 | Freq: Once | INTRAVENOUS | Status: DC

## 2015-03-30 MED ORDER — GABAPENTIN 300 MG PO CAPS: 300.0000 mg | ORAL_CAPSULE | Freq: Every day | ORAL | Status: DC

## 2015-03-30 MED ORDER — SODIUM CHLORIDE 0.9 % IV SOLN
175.0000 mg/m2 | Freq: Once | INTRAVENOUS | Status: AC
  Administered 2015-03-30: 318 mg via INTRAVENOUS
  Filled 2015-03-30: qty 53

## 2015-03-30 MED ORDER — SODIUM CHLORIDE 0.9 % IV SOLN
Freq: Once | INTRAVENOUS | Status: AC
  Administered 2015-03-30: 12:00:00 via INTRAVENOUS
  Filled 2015-03-30: qty 8

## 2015-03-30 MED ORDER — FAMOTIDINE IN NACL 20-0.9 MG/50ML-% IV SOLN
20.0000 mg | Freq: Once | INTRAVENOUS | Status: AC
  Administered 2015-03-30: 20 mg via INTRAVENOUS
  Filled 2015-03-30: qty 50

## 2015-03-30 MED ORDER — SODIUM CHLORIDE 0.9 % IJ SOLN
10.0000 mL | INTRAMUSCULAR | Status: AC | PRN
  Filled 2015-03-30: qty 10

## 2015-03-30 MED ORDER — SODIUM CHLORIDE 0.9 % IV SOLN
1.0000 g | Freq: Once | INTRAVENOUS | Status: AC
  Administered 2015-03-30: 1 g via INTRAVENOUS
  Filled 2015-03-30: qty 2

## 2015-03-30 MED ORDER — SODIUM CHLORIDE 0.9 % IV SOLN
431.5000 mg | Freq: Once | INTRAVENOUS | Status: AC
  Administered 2015-03-30: 430 mg via INTRAVENOUS
  Filled 2015-03-30: qty 43

## 2015-03-30 NOTE — Patient Instructions (Signed)
Gabapentin capsules or tablets What is this medicine? GABAPENTIN (GA ba pen tin) is used to control partial seizures in adults with epilepsy. It is also used to treat certain types of nerve pain. This medicine may be used for other purposes; ask your health care provider or pharmacist if you have questions. What should I tell my health care provider before I take this medicine? They need to know if you have any of these conditions: -kidney disease -suicidal thoughts, plans, or attempt; a previous suicide attempt by you or a family member -an unusual or allergic reaction to gabapentin, other medicines, foods, dyes, or preservatives -pregnant or trying to get pregnant -breast-feeding How should I use this medicine? Take this medicine by mouth with a glass of water. Follow the directions on the prescription label. You can take it with or without food. If it upsets your stomach, take it with food.Take your medicine at regular intervals. Do not take it more often than directed. Do not stop taking except on your doctor's advice. If you are directed to break the 600 or 800 mg tablets in half as part of your dose, the extra half tablet should be used for the next dose. If you have not used the extra half tablet within 28 days, it should be thrown away. A special MedGuide will be given to you by the pharmacist with each prescription and refill. Be sure to read this information carefully each time. Talk to your pediatrician regarding the use of this medicine in children. Special care may be needed. Overdosage: If you think you have taken too much of this medicine contact a poison control center or emergency room at once. NOTE: This medicine is only for you. Do not share this medicine with others. What if I miss a dose? If you miss a dose, take it as soon as you can. If it is almost time for your next dose, take only that dose. Do not take double or extra doses. What may interact with this medicine? Do not  take this medicine with any of the following medications: -other gabapentin products This medicine may also interact with the following medications: -alcohol -antacids -antihistamines for allergy, cough and cold -certain medicines for anxiety or sleep -certain medicines for depression or psychotic disturbances -homatropine; hydrocodone -naproxen -narcotic medicines (opiates) for pain -phenothiazines like chlorpromazine, mesoridazine, prochlorperazine, thioridazine This list may not describe all possible interactions. Give your health care provider a list of all the medicines, herbs, non-prescription drugs, or dietary supplements you use. Also tell them if you smoke, drink alcohol, or use illegal drugs. Some items may interact with your medicine. What should I watch for while using this medicine? Visit your doctor or health care professional for regular checks on your progress. You may want to keep a record at home of how you feel your condition is responding to treatment. You may want to share this information with your doctor or health care professional at each visit. You should contact your doctor or health care professional if your seizures get worse or if you have any new types of seizures. Do not stop taking this medicine or any of your seizure medicines unless instructed by your doctor or health care professional. Stopping your medicine suddenly can increase your seizures or their severity. Wear a medical identification bracelet or chain if you are taking this medicine for seizures, and carry a card that lists all your medications. You may get drowsy, dizzy, or have blurred vision. Do not drive, use   machinery, or do anything that needs mental alertness until you know how this medicine affects you. To reduce dizzy or fainting spells, do not sit or stand up quickly, especially if you are an older patient. Alcohol can increase drowsiness and dizziness. Avoid alcoholic drinks. Your mouth may get  dry. Chewing sugarless gum or sucking hard candy, and drinking plenty of water will help. The use of this medicine may increase the chance of suicidal thoughts or actions. Pay special attention to how you are responding while on this medicine. Any worsening of mood, or thoughts of suicide or dying should be reported to your health care professional right away. Women who become pregnant while using this medicine may enroll in the [place] Antiepileptic Drug Pregnancy Registry by calling 1-888-233-2334. This registry collects information about the safety of antiepileptic drug use during pregnancy. What side effects may I notice from receiving this medicine? Side effects that you should report to your doctor or health care professional as soon as possible: -allergic reactions like skin rash, itching or hives, swelling of the face, lips, or tongue -worsening of mood, thoughts or actions of suicide or dying Side effects that usually do not require medical attention (report to your doctor or health care professional if they continue or are bothersome): -constipation -difficulty walking or controlling muscle movements -dizziness -nausea -slurred speech -tiredness -tremors -weight gain This list may not describe all possible side effects. Call your doctor for medical advice about side effects. You may report side effects to FDA at 1-800-FDA-1088. Where should I keep my medicine? Keep out of reach of children. This medicine may cause accidental overdose and death if it taken by other adults, children, or pets. Mix any unused medicine with a substance like cat litter or coffee grounds. Then throw the medicine away in a sealed container like a sealed bag or a coffee can with a lid. Do not use the medicine after the expiration date. Store at room temperature between 15 and 30 degrees C (59 and 86 degrees F). NOTE: This sheet is a summary. It may not cover all possible information. If you have  questions about this medicine, talk to your doctor, pharmacist, or health care provider.    2016, Elsevier/Gold Standard. (2013-06-14 15:26:50)  

## 2015-03-30 NOTE — Progress Notes (Signed)
[facility] day:  03/30/2015  Chief Complaint: [name] is a 69 y.o. 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  HPI:  The patient was last seen in the medical oncology clinic on 03/05/2015.  At that time, she notesd a little shortness of breath caused by her abdominal distention.  She received cycle #1 carboplatin and Taxol.  She tolerated her infusion well.  She was to return for nadir counts on 03/16/2015.  Unfortunately, only a magnesium was drawn.  During the interim, she notes a neuropathy in her fingers.  She states that it takes "a little while" to button buttons and zip zippers.  Neuropathy is a little better than it was 2 weeks ago.  She denies any symptoms in her feet.  She denies any issues with nausea.  She took both Ativan and ondansetron.  She has had some issues with diarrhea up to 4 times a day.  She took Imodium.  Stool is sometimes formed.  She has been taking oral magnsium.  Regarding her abdominal distension, she feels less distended and full.  She can eat about half of what she did before she got sick.  Her breathing is better.  Regarding her rheumatoid arthritis, her methotrexate and Enbrel  Have been on hold.  The steroids around her chemotherapy have helped.  She had a flare up last Wednesday (hands, wrists, shoulders, and neck).   Past Medical History  Diagnosis Date  . Leukopenia 2012    s/p bone marrow biopsy, Dr. Ma Hillock  . Cholelithiasis   . GERD (gastroesophageal reflux disease)   . Hypertension   . Hyperlipidemia   . Esophageal stricture   . Chronic diastolic CHF (congestive heart failure) ([place])   . CAD (coronary artery disease)     a. 09/2012 Cath: LM nl, LAD 95p, 76m LCX 925mOM2 50, RCA 100.  . Marland Kitchenypokalemia   . Herniated disc   . Pneumonia 2013; 08/2012    "one lung; double" (10/18/2012)  .  Exertional shortness of breath   . [place] I diabetes [place]    "dx[place] 1957" (10/18/2012)  . History of pancytopenia   . H/O hiatal hernia   . Rheumatoid arthritis(714.0)   . NSTEMI (non-ST elevated [place] infarction) [place]    "mild" (10/18/2012)    Past Surgical History  Procedure Laterality Date  . Esophagogastroduodenoscopy  2012    Dr. IfMinna Merritts. Tubal ligation  1970  . Cataract extraction w/ intraocular lens implant Right 2010  . Cardiac catheterization  10/18/2012    "first one was today" (10/18/2012)  . Esophageal dilation      "3 or 4 times" (10/19/2011)  . Coronary artery bypass graft N/A 10/19/2012    Procedure: CORONARY ARTERY BYPASS GRAFTING (CABG);  Surgeon: StMelrose NakayamaMD;  [facility]: [facility];  Laterality: N/A;  . Peripheral vascular catheterization N/A 03/02/2015    Procedure: Porta Cath Insertion;  Surgeon: JaAlgernon HuxleyMD;  [facility];  Service: Cardiovascular;  Laterality: N/A;    Family History  Problem Relation Age of Onset  . Diabetes Mother   . Arthritis Mother   . Diabetes Father   . Arthritis Father   . Bone cancer Sister     Social History:  reports that she quit smoking about 20 years ago. Her smoking  use included Cigarettes. She has a 30 pack-year smoking history. She has never used smokeless tobacco. She reports that she does not drink alcohol or use illicit drugs.  The patient is accompanied by her husband and son today.  Allergies:  Allergies  Allergen Reactions  . Codeine     nausea    Current Medications: Current Outpatient Prescriptions  Medication Sig Dispense Refill  . amLODipine (NORVASC) 5 MG tablet Take 1 tablet (5 mg total) by mouth daily. 90 tablet 1  . aspirin 81 MG tablet Take 81 mg by mouth daily.    Marland Kitchen atorvastatin (LIPITOR) 80 MG tablet Take 1 tablet (80 mg total) by mouth daily. 90 tablet 3  . Cholecalciferol (VITAMIN D) 2000 UNITS CAPS Take 1 capsule by  mouth daily.    Marland Kitchen dexamethasone (DECADRON) 4 MG tablet 20 mg (5 tablets) 12 hours and 6 hours prior to chemotherapy 30 tablet 1  . etanercept (ENBREL) 25 MG injection Inject 25 mg into the skin once a week.    . ezetimibe (ZETIA) 10 MG tablet Take 1 tablet (10 mg total) by mouth daily. 30 tablet 11  . folic acid (FOLVITE) 1 MG tablet Take 1 tablet (1 mg total) by mouth daily. 90 tablet 3  . furosemide (LASIX) 40 MG tablet Take 1 tablet (40 mg total) by mouth 2 (two) times daily. (Patient taking differently: Take 40 mg by mouth 2 (two) times daily. ) 180 tablet 3  . ibuprofen (ADVIL,MOTRIN) 800 MG tablet Take 1 tablet (800 mg total) by mouth 3 (three) times daily as needed. 270 tablet 3  . insulin lispro (HUMALOG) 100 UNIT/ML injection As directed via Insulin pump 30 mL 5  . lidocaine-prilocaine (EMLA) cream Apply 1 application topically as needed. Apply application 1 hour prior to treatment.  Cover with Press n seal until treatment time. 30 g 1  . loratadine (CLARITIN) 10 MG tablet Take 10 mg by mouth daily.    Marland Kitchen LORazepam (ATIVAN) 0.5 MG tablet Take 1 tablet (0.5 mg total) by mouth every 6 (six) hours as needed (nausea). 30 tablet 0  . losartan (COZAAR) 100 MG tablet Take 1 tablet (100 mg total) by mouth daily. 90 tablet 3  . magnesium oxide (MAG-OX) 400 MG tablet Take 400 mg by mouth daily.    . methotrexate (RHEUMATREX) 2.5 MG tablet Take 4 tablets (10 mg total) by mouth once a week. Takes every Wednesday.Caution:Chemotherapy. Protect from light. 64 tablet 3  . metoprolol tartrate (LOPRESSOR) 25 MG tablet Take 1 tablet (25 mg total) by mouth 2 (two) times daily as needed. 180 tablet 3  . Multiple Vitamin (MULTIVITAMIN) tablet Take 1 tablet by mouth daily.      . ondansetron (ZOFRAN) 8 MG tablet Take 1 tablet (8 mg total) by mouth every 8 (eight) hours as needed for nausea or vomiting. 20 tablet 1  . pantoprazole (PROTONIX) 40 MG tablet Take 1 tablet (40 mg total) by mouth 2 (two) times daily.  (Patient taking differently: Take 40 mg by mouth daily. ) 180 tablet 3  . ranitidine (ZANTAC) 150 MG tablet Take 1 tablet (150 mg total) by mouth 2 (two) times daily. 180 tablet 2  . saccharomyces boulardii (FLORASTOR) 250 MG capsule Take 250 mg by mouth as needed.    . zoledronic acid (RECLAST) 5 MG/100ML SOLN Inject 5 mg into the vein once.    . gabapentin (NEURONTIN) 300 MG capsule Take 1 capsule (300 mg total) by mouth at bedtime. 30 capsule 1  .  potassium chloride SA (K-DUR,KLOR-CON) 10 MEQ tablet Take 1 tablet (10 mEq total) by mouth 2 (two) times daily. 20 tablet 0   No current facility-administered medications for this visit.   Facility-Administered Medications Ordered in Other Visits  Medication Dose Route Frequency Provider Last Rate Last Dose  . sodium chloride 0.9 % injection 10 mL  10 mL Intracatheter PRN Lequita Asal, MD      . sodium chloride 0.9 % injection 10 mL  10 mL Intravenous PRN Lequita Asal, MD   10 mL at 04/13/15 6195    Review of Systems:  GENERAL:  Doing ok.  No fevers or sweats.  Weight down 7 pounds. PERFORMANCE STATUS (ECOG):  1 HEENT:  No visual changes, sore throat, mouth sores or tenderness. Lungs: Breathing better.  Less shortness of breath secondary to abdominal distention.  No cough.  No hemoptysis. Cardiac:  No chest pain, palpitations, orthopnea, or PND. GI:  Loose stool, sometimes formed.  No nausea, vomiting, constipation, melena or hematochezia. GU:  No urgency, frequency, dysuria, or hematuria. Musculoskeletal:  Severe rheumatoid arthritis.  Osteoporosis.  No back pain. No muscle tenderness. Extremities:  No pain or swelling. Skin:  No rashes or skin changes. Neuro:  No headache, numbness or weakness, balance or coordination issues. Endocrine:  Diabetes.  No thyroid issues, hot flashes or night sweats. Psych:  No mood changes, depression or anxiety. Pain:  No focal pain. Review of systems:  All other systems reviewed and found to be  negative.  Physical Exam: Blood pressure 159/71, pulse 83, temperature 96.7 F (35.9 C), temperature source Tympanic, resp. rate 18, height 5' 3"  (1.6 m), weight 166 lb 10.7 oz (75.6 kg). GENERAL:  Well developed, well nourished, sitting comfortably in the exam room in no acute distress. MENTAL STATUS:  Alert and oriented to person, place and time. HEAD:  Wearing a croquet hat and scarf.  Normocephalic, atraumatic, face symmetric, no Cushingoid features. EYES:  Gold rimmed glasses.  Blue eyes.  No conjunctivitis or scleral icterus.  ENT: Oropharynx clear without lesion. Tongue normal. Mucous membranes moist.  RESPIRATORY: Clear to auscultation without rales, wheezes or rhonchi. CARDIOVASCULAR: Regular rate and rhythm without murmur, rub or gallop. CHEST:  Unremarkable port-a-cath. ABDOMEN: Abdominal distension, improved.  Soft, non-tender with active bowel sounds and no appreciable hepatosplenomegaly. No palpable nodularity or masses. SKIN: SQ hematoma on abdominal wall from old insulin pump site.  No rashes, ulcers or lesions. EXTREMITIES: Bilateral lower extremity edema (right > left).  No skin discoloration or tenderness. No palpable cords. LYMPH NODES: No palpable cervical, supraclavicular, axillary or inguinal adenopathy  NEUROLOGICAL: Unremarkable. PSYCH: Appropriate.  Infusion on 03/30/2015  Component Date Value Ref Range Status  . Magnesium 03/30/2015 1.6* 1.7 - 2.4 mg/dL Final  . WBC 03/30/2015 6.0  3.6 - 11.0 K/uL Final  . RBC 03/30/2015 3.66* 3.80 - 5.20 MIL/uL Final  . Hemoglobin 03/30/2015 10.3* 12.0 - 16.0 g/dL Final  . HCT 03/30/2015 31.3* 35.0 - 47.0 % Final  . MCV 03/30/2015 85.5  80.0 - 100.0 fL Final  . MCH 03/30/2015 28.3  26.0 - 34.0 pg Final  . MCHC 03/30/2015 33.1  32.0 - 36.0 g/dL Final  . RDW 03/30/2015 16.5* 11.5 - 14.5 % Final  . Platelets 03/30/2015 301  150 - 440 K/uL Final  . Neutrophils Relative % 03/30/2015 93   Final  . Neutro Abs 03/30/2015  5.6  1.4 - 6.5 K/uL Final  . Lymphocytes Relative 03/30/2015 5   Final  .  Lymphs Abs 03/30/2015 0.3* 1.0 - 3.6 K/uL Final  . Monocytes Relative 03/30/2015 2   Final  . Monocytes Absolute 03/30/2015 0.1* 0.2 - 0.9 K/uL Final  . Eosinophils Relative 03/30/2015 0   Final  . Eosinophils Absolute 03/30/2015 0.0  0 - 0.7 K/uL Final  . Basophils Relative 03/30/2015 0   Final  . Basophils Absolute 03/30/2015 0.0  0 - 0.1 K/uL Final  . Sodium 03/30/2015 131* 135 - 145 mmol/L Final  . Potassium 03/30/2015 3.6  3.5 - 5.1 mmol/L Final  . Chloride 03/30/2015 95* 101 - 111 mmol/L Final  . CO2 03/30/2015 26  22 - 32 mmol/L Final  . Glucose, Bld 03/30/2015 379* 65 - 99 mg/dL Final  . BUN 03/30/2015 18  6 - 20 mg/dL Final  . Creatinine, Ser 03/30/2015 0.86  0.44 - 1.00 mg/dL Final  . Calcium 03/30/2015 8.5* 8.9 - 10.3 mg/dL Final  . Total Protein 03/30/2015 7.0  6.5 - 8.1 g/dL Final  . Albumin 03/30/2015 3.1* 3.5 - 5.0 g/dL Final  . AST 03/30/2015 23  15 - 41 U/L Final  . ALT 03/30/2015 15  14 - 54 U/L Final  . Alkaline Phosphatase 03/30/2015 68  38 - 126 U/L Final  . Total Bilirubin 03/30/2015 0.7  0.3 - 1.2 mg/dL Final  . GFR calc non Af Amer 03/30/2015 >60  >60 mL/min Final  . GFR calc Af Amer 03/30/2015 >60  >60 mL/min Final   Comment: (NOTE) The eGFR has been calculated using the CKD EPI equation. This calculation has not been validated in all clinical situations. eGFR's persistently <60 mL/min signify possible Chronic Kidney Disease.   . Anion gap 03/30/2015 10  5 - 15 Final  . CA 125 03/30/2015 802.9* 0.0 - 38.1 U/mL Final   Comment: (NOTE) [employer] ECLIA methodology Performed At: [facility] [address] [place], [place] [id number] Lindon Romp MD DZ:[id number]     Assessment:  [name] is a 69 y.o. female with clinical stage IIIC (T3cN1Mx) ovarian cancer presenting with abdominal discomfort and bloating.  Omental biopsy on 02/23/2015 revealed metastatic high grade  serous carcinoma, consistent with gynecologic origin.  CA125 was 707 on 02/17/2015.  Abdomen and pelvic CT scan on 02/13/2015 revealed bilateral mass-like adnexal regions (right adnexal mass 5.6 x 5.0 cm and the left adnexa mass 10.3 x 6.0 x 9.9 cm).  There was a large amount of soft tissue throughout the peritoneal cavity involving the omentum and other peritoneal surfaces.  There was a small volume ascites. There was a peripheral 3.3 x 1.9 cm low-attenuation lesion overlying the right lobe of the liver , likely representing a serosal implant. There was 1.4 x 2.2 cm ill-defined peripheral lesion within the inferior aspect of segment 6 adjacent to the ampulla in the duodenum.   She has severe rheumatoid arthritis.  Methotrexate and Enbrel are on hold.  She has a normocytic anemia.  Work-up on 02/17/2015 revealed a normal ferritin, B12, folate, TSH.  She denies any melena or hematochezia.  She has diabetes and is on an insulin pump.  She is s/p cycle #1 neoadjuvant carboplatin and Taxol (03/05/2015).  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  She has had some loose stools on oral magnesium.  CA125 is  802.9 today.  Symptomatically, her abdominal distension has improved.  Her arthritis flared last week.  She has mild hypomagnesemia.  Plan: 1.  Labs today:  CBC with diff, CMP, Mg, CA125. 2.  Discuss  MyRisk genetic testing (performed). 3.  Cycle #2 carboplatin and Taxol.  Discuss neuropathy.  Continue same dose. 4.  Magnesium today. 5.  Discontinue oral magnesium. 6.  Bilateral lower extremity duplex. 7.  Information on gabapentin. 8.  Begin Neurontin 300 mg po q hs.  Titrate dose every 3-5 days. 9.  RTC in 10 days for labs (CBC with diff, BMP, Mg) +/- IV magnesium. 10. RTC in 3 weeks for MD assess, labs (CBC with diff, CMP, Mg, CA125), and cycle #3 carboplatin and Taxol.  Addendum:  CA125 increased.  Check CA125 in 10 days.  If increasing, schedule repeat abdominal/pelvic CT scan prior to  cycle #3 rather than after cycle #3 (initial neoadjuvant plan for 3 cycles followed by surgery than additional 3 cycles).  Bilateral lower extremity duplex on 03/31/2015 revealed no evidence of DVT.    Lequita Asal, MD  03/30/2015, 9:40 AM

## 2015-03-30 NOTE — Progress Notes (Signed)
Patient is here for follow-up and chemo treatment. Patient states that she has been feeling better the past week. Appetite has been good. She denies any pain.

## 2015-03-31 ENCOUNTER — Ambulatory Visit
Admission: RE | Admit: 2015-03-31 | Discharge: 2015-03-31 | Disposition: A | Discharge status: Home / Self Care | Payer: Medicare Other | Source: Ambulatory Visit | Attending: Hematology and Oncology | Admitting: Hematology and Oncology

## 2015-03-31 ENCOUNTER — Telehealth: Payer: Self-pay | Admitting: *Deleted

## 2015-03-31 DIAGNOSIS — R600 Localized edema: Secondary | ICD-10-CM | POA: Diagnosis not present

## 2015-03-31 DIAGNOSIS — C786 Secondary malignant neoplasm of retroperitoneum and peritoneum: Secondary | ICD-10-CM

## 2015-03-31 DIAGNOSIS — C801 Malignant (primary) neoplasm, unspecified: Principal | ICD-10-CM

## 2015-03-31 LAB — CA 125: CA 125: 802.9 U/mL — ABNORMAL HIGH (ref 0.0–38.1)

## 2015-03-31 NOTE — Telephone Encounter (Signed)
  Please call patient with tumor marker.  The first marker (707) was drawn 2-2.5 weeks before 1st cycle of chemotherapy.  It was likely higher on the first day of treatment (03/05/2015).  Yesterday 802.9.  Let's plan on repeating CA125 in 2 weeks from now.  If no significant improvement, we will perform a scan before cycle #3 (instead of after cycle #3).  M

## 2015-03-31 NOTE — Telephone Encounter (Signed)
Left a message on pt VM to return my call. Order entered for 12/12 when she comes in for lab appt

## 2015-03-31 NOTE — Telephone Encounter (Signed)
CA 125  Status: Finalresult Visible to patient:  Not Released Nextappt: Today at 05:00 PM in Radiology ([facility]) Dx:  Peritoneal carcinomatosis ([place])              Ref Range 1d ago  40mo ago     CA 125 0.0 - 38.1 U/mL 802.9 (H) 707.0 (H)CM

## 2015-04-01 NOTE — Telephone Encounter (Signed)
I called patient and discussed her results and explained that we are going to recheck her marker again on 12/12 when she comes in for labs. She thanked me for calling

## 2015-04-07 ENCOUNTER — Telehealth: Payer: Self-pay | Admitting: *Deleted

## 2015-04-07 NOTE — Telephone Encounter (Signed)
Complains of burning and tingling in her fingers and hands, was started on Gabapentin 300 mg at bedtime on 11/28, but she reports that there has been no improvement in her symptoms and is asking that something else be done to alleviate her discomfort

## 2015-04-07 NOTE — Telephone Encounter (Signed)
Pt called back and stated she will take 1 cap in morning and 1 cap at night

## 2015-04-07 NOTE — Telephone Encounter (Signed)
Per Dr Mike Gip increase Gabapentin to 300 mg bid. PC to patient left message on vm to return my call

## 2015-04-11 ENCOUNTER — Encounter: Payer: Self-pay | Admitting: Hematology and Oncology

## 2015-04-11 ENCOUNTER — Other Ambulatory Visit: Payer: Self-pay | Admitting: Hematology and Oncology

## 2015-04-13 ENCOUNTER — Telehealth: Payer: Self-pay

## 2015-04-13 ENCOUNTER — Inpatient Hospital Stay: Payer: Medicare Other | Attending: Hematology and Oncology

## 2015-04-13 ENCOUNTER — Other Ambulatory Visit: Payer: Self-pay

## 2015-04-13 ENCOUNTER — Other Ambulatory Visit: Payer: Self-pay | Admitting: Hematology and Oncology

## 2015-04-13 ENCOUNTER — Inpatient Hospital Stay: Payer: Medicare Other

## 2015-04-13 DIAGNOSIS — Z7982 Long term (current) use of aspirin: Secondary | ICD-10-CM | POA: Insufficient documentation

## 2015-04-13 DIAGNOSIS — E119 Type 2 diabetes mellitus without complications: Secondary | ICD-10-CM | POA: Diagnosis not present

## 2015-04-13 DIAGNOSIS — R200 Anesthesia of skin: Secondary | ICD-10-CM | POA: Insufficient documentation

## 2015-04-13 DIAGNOSIS — I252 Old myocardial infarction: Secondary | ICD-10-CM | POA: Diagnosis not present

## 2015-04-13 DIAGNOSIS — Z5111 Encounter for antineoplastic chemotherapy: Secondary | ICD-10-CM | POA: Insufficient documentation

## 2015-04-13 DIAGNOSIS — Z7952 Long term (current) use of systemic steroids: Secondary | ICD-10-CM | POA: Insufficient documentation

## 2015-04-13 DIAGNOSIS — Z87891 Personal history of nicotine dependence: Secondary | ICD-10-CM | POA: Insufficient documentation

## 2015-04-13 DIAGNOSIS — Z79899 Other long term (current) drug therapy: Secondary | ICD-10-CM | POA: Insufficient documentation

## 2015-04-13 DIAGNOSIS — Z862 Personal history of diseases of the blood and blood-forming organs and certain disorders involving the immune mechanism: Secondary | ICD-10-CM | POA: Insufficient documentation

## 2015-04-13 DIAGNOSIS — Z8719 Personal history of other diseases of the digestive system: Secondary | ICD-10-CM | POA: Diagnosis not present

## 2015-04-13 DIAGNOSIS — C801 Malignant (primary) neoplasm, unspecified: Secondary | ICD-10-CM

## 2015-04-13 DIAGNOSIS — Z809 Family history of malignant neoplasm, unspecified: Secondary | ICD-10-CM | POA: Insufficient documentation

## 2015-04-13 DIAGNOSIS — I10 Essential (primary) hypertension: Secondary | ICD-10-CM | POA: Diagnosis not present

## 2015-04-13 DIAGNOSIS — M069 Rheumatoid arthritis, unspecified: Secondary | ICD-10-CM | POA: Insufficient documentation

## 2015-04-13 DIAGNOSIS — C786 Secondary malignant neoplasm of retroperitoneum and peritoneum: Secondary | ICD-10-CM

## 2015-04-13 DIAGNOSIS — D72819 Decreased white blood cell count, unspecified: Secondary | ICD-10-CM | POA: Diagnosis not present

## 2015-04-13 DIAGNOSIS — K219 Gastro-esophageal reflux disease without esophagitis: Secondary | ICD-10-CM | POA: Diagnosis not present

## 2015-04-13 DIAGNOSIS — G629 Polyneuropathy, unspecified: Secondary | ICD-10-CM | POA: Diagnosis not present

## 2015-04-13 DIAGNOSIS — E876 Hypokalemia: Secondary | ICD-10-CM | POA: Insufficient documentation

## 2015-04-13 DIAGNOSIS — J189 Pneumonia, unspecified organism: Secondary | ICD-10-CM | POA: Diagnosis not present

## 2015-04-13 DIAGNOSIS — E785 Hyperlipidemia, unspecified: Secondary | ICD-10-CM | POA: Diagnosis not present

## 2015-04-13 DIAGNOSIS — I5032 Chronic diastolic (congestive) heart failure: Secondary | ICD-10-CM | POA: Diagnosis not present

## 2015-04-13 DIAGNOSIS — C569 Malignant neoplasm of unspecified ovary: Secondary | ICD-10-CM | POA: Diagnosis present

## 2015-04-13 DIAGNOSIS — I2510 Atherosclerotic heart disease of native coronary artery without angina pectoris: Secondary | ICD-10-CM | POA: Insufficient documentation

## 2015-04-13 DIAGNOSIS — R0609 Other forms of dyspnea: Secondary | ICD-10-CM | POA: Insufficient documentation

## 2015-04-13 DIAGNOSIS — D649 Anemia, unspecified: Secondary | ICD-10-CM | POA: Insufficient documentation

## 2015-04-13 DIAGNOSIS — R202 Paresthesia of skin: Secondary | ICD-10-CM | POA: Diagnosis not present

## 2015-04-13 LAB — MAGNESIUM: Magnesium: 1.3 mg/dL — ABNORMAL LOW (ref 1.7–2.4)

## 2015-04-13 LAB — BASIC METABOLIC PANEL
Anion gap: 6 (ref 5–15)
BUN: 11 mg/dL (ref 6–20)
CO2: 26 mmol/L (ref 22–32)
Calcium: 8 mg/dL — ABNORMAL LOW (ref 8.9–10.3)
Chloride: 100 mmol/L — ABNORMAL LOW (ref 101–111)
Creatinine, Ser: 0.61 mg/dL (ref 0.44–1.00)
GFR calc Af Amer: >60 mL/min (ref >60)
GFR calc non Af Amer: >60 mL/min (ref >60)
Glucose, Bld: 194 mg/dL — ABNORMAL HIGH (ref 65–99)
Potassium: 3.1 mmol/L — ABNORMAL LOW (ref 3.5–5.1)
Sodium: 132 mmol/L — ABNORMAL LOW (ref 135–145)

## 2015-04-13 LAB — CBC WITH DIFFERENTIAL/PLATELET
Basophils Absolute: 0 10*3/uL (ref 0–0.1)
Basophils Relative: 1 %
Eosinophils Absolute: 0 10*3/uL (ref 0–0.7)
Eosinophils Relative: 1 %
HCT: 27.6 % — ABNORMAL LOW (ref 35.0–47.0)
Hemoglobin: 9.1 g/dL — ABNORMAL LOW (ref 12.0–16.0)
Lymphocytes Relative: 20 %
Lymphs Abs: 0.7 10*3/uL — ABNORMAL LOW (ref 1.0–3.6)
MCH: 28 pg (ref 26.0–34.0)
MCHC: 32.9 g/dL (ref 32.0–36.0)
MCV: 84.9 fL (ref 80.0–100.0)
Monocytes Absolute: 0.6 10*3/uL (ref 0.2–0.9)
Monocytes Relative: 20 %
Neutro Abs: 1.8 10*3/uL (ref 1.4–6.5)
Neutrophils Relative %: 58 %
Platelets: 253 10*3/uL (ref 150–440)
RBC: 3.25 MIL/uL — ABNORMAL LOW (ref 3.80–5.20)
RDW: 17 % — ABNORMAL HIGH (ref 11.5–14.5)
WBC: 3.2 10*3/uL — ABNORMAL LOW (ref 3.6–11.0)

## 2015-04-13 MED ORDER — MAGNESIUM SULFATE 2 GM/50ML IV SOLN: 2.0000 g | Freq: Once | INTRAVENOUS | Status: DC

## 2015-04-13 MED ORDER — SODIUM CHLORIDE 0.9 % IJ SOLN
10.0000 mL | INTRAMUSCULAR | Status: AC | PRN
  Administered 2015-04-13: 10 mL via INTRAVENOUS
  Filled 2015-04-13: qty 10

## 2015-04-13 MED ORDER — SODIUM CHLORIDE 0.9 % IV SOLN
Freq: Once | INTRAVENOUS | Status: AC
  Administered 2015-04-13: 10:00:00 via INTRAVENOUS
  Filled 2015-04-13: qty 100

## 2015-04-13 MED ORDER — HEPARIN SOD (PORK) LOCK FLUSH 100 UNIT/ML IV SOLN
INTRAVENOUS | Status: AC
  Filled 2015-04-13: qty 5

## 2015-04-13 MED ORDER — POTASSIUM CHLORIDE 20 MEQ/100ML IV SOLN: 20.0000 meq | Freq: Once | INTRAVENOUS | Status: DC

## 2015-04-13 MED ORDER — POTASSIUM CHLORIDE CRYS ER 10 MEQ PO TBCR: 10.0000 meq | EXTENDED_RELEASE_TABLET | Freq: Two times a day (BID) | ORAL | Status: DC

## 2015-04-13 MED ORDER — HEPARIN SOD (PORK) LOCK FLUSH 100 UNIT/ML IV SOLN
500.0000 [IU] | Freq: Once | INTRAVENOUS | Status: AC
  Administered 2015-04-13: 500 [IU] via INTRAVENOUS

## 2015-04-13 NOTE — Telephone Encounter (Signed)
Spoke with pt and husband in infusion about po potassium.  Per MD pt to take 10 meq BID x 3 days then !0 meq Daily.  Pt and husband verbalized an understanding and prescription was sent in.  Per pt neuropathy has worsened since beginning Chemo and continues to worsen even with Gabapentin increase.  I spoke with Dr. Mike Gip about the status of Neuropathy.  Dr. Mike Gip ordered additional labs and will see pt on Monday.

## 2015-04-14 LAB — CA 125: CA 125: 567.9 U/mL — ABNORMAL HIGH (ref 0.0–38.1)

## 2015-04-16 ENCOUNTER — Telehealth: Payer: Self-pay

## 2015-04-16 ENCOUNTER — Other Ambulatory Visit: Payer: Self-pay

## 2015-04-16 NOTE — Telephone Encounter (Signed)
Pt called regarding Gabapentin.  I spoke with pt and she stated she is only taking one a day now, and does not want me to refill it she changed her mind until Monday until she speaks with MD.  I asked pt if she would like me to ask now and she stated no.  No further concerns noted.

## 2015-04-18 ENCOUNTER — Other Ambulatory Visit: Payer: Self-pay | Admitting: Hematology and Oncology

## 2015-04-20 ENCOUNTER — Inpatient Hospital Stay: Payer: Medicare Other

## 2015-04-20 ENCOUNTER — Inpatient Hospital Stay (HOSPITAL_BASED_OUTPATIENT_CLINIC_OR_DEPARTMENT_OTHER): Payer: Medicare Other | Admitting: Internal Medicine

## 2015-04-20 ENCOUNTER — Encounter: Payer: Self-pay | Admitting: Hematology and Oncology

## 2015-04-20 ENCOUNTER — Ambulatory Visit: Payer: Medicare Other | Admitting: Hematology and Oncology

## 2015-04-20 VITALS — BP 150/71 | HR 80 | Temp 96.9°F | Resp 18 | Ht 63.0 in | Wt 151.7 lb

## 2015-04-20 DIAGNOSIS — C569 Malignant neoplasm of unspecified ovary: Secondary | ICD-10-CM

## 2015-04-20 DIAGNOSIS — C786 Secondary malignant neoplasm of retroperitoneum and peritoneum: Secondary | ICD-10-CM

## 2015-04-20 DIAGNOSIS — D649 Anemia, unspecified: Secondary | ICD-10-CM | POA: Diagnosis not present

## 2015-04-20 DIAGNOSIS — Z862 Personal history of diseases of the blood and blood-forming organs and certain disorders involving the immune mechanism: Secondary | ICD-10-CM

## 2015-04-20 DIAGNOSIS — G629 Polyneuropathy, unspecified: Secondary | ICD-10-CM | POA: Diagnosis not present

## 2015-04-20 DIAGNOSIS — K219 Gastro-esophageal reflux disease without esophagitis: Secondary | ICD-10-CM

## 2015-04-20 DIAGNOSIS — Z7982 Long term (current) use of aspirin: Secondary | ICD-10-CM

## 2015-04-20 DIAGNOSIS — E785 Hyperlipidemia, unspecified: Secondary | ICD-10-CM

## 2015-04-20 DIAGNOSIS — D72819 Decreased white blood cell count, unspecified: Secondary | ICD-10-CM

## 2015-04-20 DIAGNOSIS — Z809 Family history of malignant neoplasm, unspecified: Secondary | ICD-10-CM

## 2015-04-20 DIAGNOSIS — I5032 Chronic diastolic (congestive) heart failure: Secondary | ICD-10-CM

## 2015-04-20 DIAGNOSIS — M069 Rheumatoid arthritis, unspecified: Secondary | ICD-10-CM

## 2015-04-20 DIAGNOSIS — C801 Malignant (primary) neoplasm, unspecified: Principal | ICD-10-CM

## 2015-04-20 DIAGNOSIS — I2510 Atherosclerotic heart disease of native coronary artery without angina pectoris: Secondary | ICD-10-CM

## 2015-04-20 DIAGNOSIS — Z87891 Personal history of nicotine dependence: Secondary | ICD-10-CM

## 2015-04-20 DIAGNOSIS — R202 Paresthesia of skin: Secondary | ICD-10-CM

## 2015-04-20 DIAGNOSIS — E876 Hypokalemia: Secondary | ICD-10-CM

## 2015-04-20 DIAGNOSIS — I10 Essential (primary) hypertension: Secondary | ICD-10-CM

## 2015-04-20 DIAGNOSIS — E119 Type 2 diabetes mellitus without complications: Secondary | ICD-10-CM

## 2015-04-20 DIAGNOSIS — Z8719 Personal history of other diseases of the digestive system: Secondary | ICD-10-CM

## 2015-04-20 DIAGNOSIS — I252 Old myocardial infarction: Secondary | ICD-10-CM

## 2015-04-20 DIAGNOSIS — Z7952 Long term (current) use of systemic steroids: Secondary | ICD-10-CM

## 2015-04-20 DIAGNOSIS — R0609 Other forms of dyspnea: Secondary | ICD-10-CM

## 2015-04-20 DIAGNOSIS — Z79899 Other long term (current) drug therapy: Secondary | ICD-10-CM

## 2015-04-20 DIAGNOSIS — R200 Anesthesia of skin: Secondary | ICD-10-CM

## 2015-04-20 LAB — CBC WITH DIFFERENTIAL/PLATELET
Basophils Absolute: 0 10*3/uL (ref 0–0.1)
Basophils Relative: 0 %
Eosinophils Absolute: 0 10*3/uL (ref 0–0.7)
Eosinophils Relative: 0 %
HCT: 30.9 % — ABNORMAL LOW (ref 35.0–47.0)
Hemoglobin: 10.3 g/dL — ABNORMAL LOW (ref 12.0–16.0)
Lymphocytes Relative: 4 %
Lymphs Abs: 0.3 10*3/uL — ABNORMAL LOW (ref 1.0–3.6)
MCH: 28.5 pg (ref 26.0–34.0)
MCHC: 33.2 g/dL (ref 32.0–36.0)
MCV: 85.8 fL (ref 80.0–100.0)
Monocytes Absolute: 0.1 10*3/uL — ABNORMAL LOW (ref 0.2–0.9)
Monocytes Relative: 2 %
Neutro Abs: 5.7 10*3/uL (ref 1.4–6.5)
Neutrophils Relative %: 94 %
Platelets: 322 10*3/uL (ref 150–440)
RBC: 3.6 MIL/uL — ABNORMAL LOW (ref 3.80–5.20)
RDW: 18 % — ABNORMAL HIGH (ref 11.5–14.5)
WBC: 6.1 10*3/uL (ref 3.6–11.0)

## 2015-04-20 LAB — COMPREHENSIVE METABOLIC PANEL
ALT: 16 U/L (ref 14–54)
AST: 22 U/L (ref 15–41)
Albumin: 3.2 g/dL — ABNORMAL LOW (ref 3.5–5.0)
Alkaline Phosphatase: 66 U/L (ref 38–126)
Anion gap: 10 (ref 5–15)
BUN: 24 mg/dL — ABNORMAL HIGH (ref 6–20)
CO2: 23 mmol/L (ref 22–32)
Calcium: 9.3 mg/dL (ref 8.9–10.3)
Chloride: 96 mmol/L — ABNORMAL LOW (ref 101–111)
Creatinine, Ser: 0.73 mg/dL (ref 0.44–1.00)
GFR calc Af Amer: >60 mL/min (ref >60)
GFR calc non Af Amer: >60 mL/min (ref >60)
Glucose, Bld: 455 mg/dL — ABNORMAL HIGH (ref 65–99)
Potassium: 4.4 mmol/L (ref 3.5–5.1)
Sodium: 129 mmol/L — ABNORMAL LOW (ref 135–145)
Total Bilirubin: 0.5 mg/dL (ref 0.3–1.2)
Total Protein: 7.4 g/dL (ref 6.5–8.1)

## 2015-04-20 LAB — MAGNESIUM: Magnesium: 1.6 mg/dL — ABNORMAL LOW (ref 1.7–2.4)

## 2015-04-20 MED ORDER — MAGNESIUM SULFATE 2 GM/50ML IV SOLN
2.0000 g | Freq: Once | INTRAVENOUS | Status: AC
  Administered 2015-04-20: 2 g via INTRAVENOUS
  Filled 2015-04-20: qty 50

## 2015-04-20 MED ORDER — SODIUM CHLORIDE 0.9 % IV SOLN
Freq: Once | INTRAVENOUS | Status: AC
  Administered 2015-04-20: 12:00:00 via INTRAVENOUS
  Filled 2015-04-20: qty 8

## 2015-04-20 MED ORDER — DIPHENHYDRAMINE HCL 50 MG/ML IJ SOLN
50.0000 mg | Freq: Once | INTRAMUSCULAR | Status: AC
Start: 2015-04-20 — End: 2015-04-20
  Administered 2015-04-20: 50 mg via INTRAVENOUS
  Filled 2015-04-20: qty 1

## 2015-04-20 MED ORDER — SODIUM CHLORIDE 0.9 % IV SOLN
431.5000 mg | Freq: Once | INTRAVENOUS | Status: AC
  Administered 2015-04-20: 430 mg via INTRAVENOUS
  Filled 2015-04-20: qty 43

## 2015-04-20 MED ORDER — FAMOTIDINE IN NACL 20-0.9 MG/50ML-% IV SOLN
20.0000 mg | Freq: Once | INTRAVENOUS | Status: AC
  Administered 2015-04-20: 20 mg via INTRAVENOUS
  Filled 2015-04-20: qty 50

## 2015-04-20 MED ORDER — SODIUM CHLORIDE 0.9 % IV SOLN
175.0000 mg/m2 | Freq: Once | INTRAVENOUS | Status: AC
  Administered 2015-04-20: 318 mg via INTRAVENOUS
  Filled 2015-04-20: qty 53

## 2015-04-20 MED ORDER — HEPARIN SOD (PORK) LOCK FLUSH 100 UNIT/ML IV SOLN
500.0000 [IU] | Freq: Once | INTRAVENOUS | Status: AC | PRN
  Administered 2015-04-20: 500 [IU]
  Filled 2015-04-20: qty 5

## 2015-04-20 MED ORDER — SODIUM CHLORIDE 0.9 % IV SOLN
Freq: Once | INTRAVENOUS | Status: AC
  Administered 2015-04-20: 10:00:00 via INTRAVENOUS
  Filled 2015-04-20: qty 1000

## 2015-04-20 MED ORDER — PACLITAXEL CHEMO INJECTION 300 MG/50ML
175.0000 mg/m2 | Freq: Once | INTRAVENOUS | Status: DC
  Filled 2015-04-20: qty 53

## 2015-04-20 NOTE — Progress Notes (Signed)
[facility] day:  03/30/2015  Chief Complaint: [name] is a 69 y.o. female with bilateral ovarian masses with associated peritoneal metastatic disease who is seen for assessment prior to cycle # 3 carboplatin and Taxol.  HPI:  clinical stage IIIC (T3cN1Mx) ovarian cancer.  Omental biopsy on 02/23/2015 revealed metastatic high grade serous carcinoma, consistent with gynecologic origin.  CA125 was 707 on 02/17/2015. She was started on neoadjuvant carboplatin and Taxol.  Current status: [name] returns to our clinic for a follow-up visit. She has felt fairly well since her last appointment. She has been able to tolerate cycle 2 of carbo/taxol pretty well, but she continues to complain of persistent neuropathy in the form of numbness and tingling of her fingers. She has difficulties writing with a pen, but is able to hold a spoon and use other utensils. She claims that she recently she doubled the dose of Neurontin, but without any noticeable effect. Otherwise, she continues to have wobbly gait, but denies nausea, vomiting, diarrhea, abdominal pain. She claims that lower extremity edema has significantly improved. She continues to use insulin pump, but her finger stick glucose is very high when she takes dexamethasone prior to Taxol. She continues to take 5 mg of prednisone for rheumatoid arthritis, but skips the dose when she takes dexamethasone.   Past Medical History  Diagnosis Date  . Leukopenia 2012    s/p bone marrow biopsy, Dr. Ma Hillock  . Cholelithiasis   . GERD (gastroesophageal reflux disease)   . Hypertension   . Hyperlipidemia   . Esophageal stricture   . Chronic diastolic CHF (congestive heart failure) ([place])   . CAD (coronary artery disease)     a. 09/2012 Cath: LM nl, LAD 95p, 70m LCX 93mOM2 50, RCA 100.  . Marland Kitchenypokalemia   . Herniated disc   . Pneumonia 2013; 08/2012    "one lung; double" (10/18/2012)  . Exertional shortness  of breath   . Type I diabetes mellitus ([facility]    "dx'd in 1957" (10/18/2012)  . History of pancytopenia   . H/O hiatal hernia   . Rheumatoid arthritis(714.0)   . NSTEMI (non-ST elevated myocardial infarction) ([facility]/[place]    "mild" (10/18/2012)    Past Surgical History  Procedure Laterality Date  . Esophagogastroduodenoscopy  2012    Dr. IfMinna Merritts. Tubal ligation  1970  . Cataract extraction w/ intraocular lens implant Right 2010  . Cardiac catheterization  10/18/2012    "first one was today" (10/18/2012)  . Esophageal dilation      "3 or 4 times" (10/19/2011)  . Coronary artery bypass graft N/A 10/19/2012    Procedure: CORONARY ARTERY BYPASS GRAFTING (CABG);  Surgeon: StMelrose NakayamaMD;  [facility] Service: Open [facility] Surgery;  Laterality: N/A;  . Peripheral vascular catheterization N/A 03/02/2015    Procedure: Porta Cath Insertion;  Surgeon: JaAlgernon HuxleyMD;  [facility];  Service: Cardiovascular;  Laterality: N/A;    Family History  Problem Relation Age of Onset  . Diabetes Mother   . Arthritis Mother   . Diabetes Father   . Arthritis Father   . Bone cancer Sister     Social History:  reports that she quit smoking about 20 years ago. Her smoking use included Cigarettes. She has a 30 pack-year smoking history. She has never used smokeless tobacco. She reports that she does not drink alcohol or use illicit drugs.  The patient is accompanied by her husband and son today.  Allergies:  Allergies  Allergen Reactions  . Codeine     nausea    Current Medications: Current Outpatient Prescriptions  Medication Sig Dispense Refill  . amLODipine (NORVASC) 5 MG tablet Take 1 tablet (5 mg total) by mouth daily. 90 tablet 1  . aspirin 81 MG tablet Take 81 mg by mouth daily.    Marland Kitchen atorvastatin (LIPITOR) 80 MG tablet Take 1 tablet (80 mg total) by mouth daily. 90 tablet 3  . Cholecalciferol (VITAMIN D) 2000 UNITS CAPS Take 1 capsule by mouth daily.    Marland Kitchen  dexamethasone (DECADRON) 4 MG tablet 20 mg (5 tablets) 12 hours and 6 hours prior to chemotherapy 30 tablet 1  . etanercept (ENBREL) 25 MG injection Inject 25 mg into the skin once a week.    . ezetimibe (ZETIA) 10 MG tablet Take 1 tablet (10 mg total) by mouth daily. 30 tablet 11  . folic acid (FOLVITE) 1 MG tablet Take 1 tablet (1 mg total) by mouth daily. 90 tablet 3  . furosemide (LASIX) 40 MG tablet Take 1 tablet (40 mg total) by mouth 2 (two) times daily. (Patient taking differently: Take 40 mg by mouth 2 (two) times daily. ) 180 tablet 3  . gabapentin (NEURONTIN) 300 MG capsule Take 1 capsule (300 mg total) by mouth at bedtime. 30 capsule 1  . ibuprofen (ADVIL,MOTRIN) 800 MG tablet Take 1 tablet (800 mg total) by mouth 3 (three) times daily as needed. 270 tablet 3  . insulin lispro (HUMALOG) 100 UNIT/ML injection As directed via Insulin pump 30 mL 5  . lidocaine-prilocaine (EMLA) cream Apply 1 application topically as needed. Apply application 1 hour prior to treatment.  Cover with Press n seal until treatment time. 30 g 1  . loratadine (CLARITIN) 10 MG tablet Take 10 mg by mouth daily.    Marland Kitchen LORazepam (ATIVAN) 0.5 MG tablet Take 1 tablet (0.5 mg total) by mouth every 6 (six) hours as needed (nausea). 30 tablet 0  . losartan (COZAAR) 100 MG tablet Take 1 tablet (100 mg total) by mouth daily. 90 tablet 3  . magnesium oxide (MAG-OX) 400 MG tablet Take 400 mg by mouth daily.    . methotrexate (RHEUMATREX) 2.5 MG tablet Take 4 tablets (10 mg total) by mouth once a week. Takes every Wednesday.Caution:Chemotherapy. Protect from light. 64 tablet 3  . metoprolol tartrate (LOPRESSOR) 25 MG tablet Take 1 tablet (25 mg total) by mouth 2 (two) times daily as needed. 180 tablet 3  . Multiple Vitamin (MULTIVITAMIN) tablet Take 1 tablet by mouth daily.      . ondansetron (ZOFRAN) 8 MG tablet Take 1 tablet (8 mg total) by mouth every 8 (eight) hours as needed for nausea or vomiting. 20 tablet 1  .  pantoprazole (PROTONIX) 40 MG tablet Take 1 tablet (40 mg total) by mouth 2 (two) times daily. (Patient taking differently: Take 40 mg by mouth daily. ) 180 tablet 3  . potassium chloride SA (K-DUR,KLOR-CON) 10 MEQ tablet Take 1 tablet (10 mEq total) by mouth 2 (two) times daily. 20 tablet 0  . ranitidine (ZANTAC) 150 MG tablet Take 1 tablet (150 mg total) by mouth 2 (two) times daily. 180 tablet 2  . saccharomyces boulardii (FLORASTOR) 250 MG capsule Take 250 mg by mouth as needed.    . zoledronic acid (RECLAST) 5 MG/100ML SOLN Inject 5 mg into the vein once.     No current facility-administered  medications for this visit.   Facility-Administered Medications Ordered in Other Visits  Medication Dose Route Frequency Provider Last Rate Last Dose  . sodium chloride 0.9 % injection 10 mL  10 mL Intracatheter PRN Lequita Asal, MD      . sodium chloride 0.9 % injection 10 mL  10 mL Intravenous PRN Lequita Asal, MD   10 mL at 04/13/15 1517    Review of Systems:  GENERAL:  Doing ok.  No fevers or sweats.  Weight down 7 pounds. PERFORMANCE STATUS (ECOG):  1 HEENT:  No visual changes, sore throat, mouth sores or tenderness. Lungs: Breathing better.  Less shortness of breath secondary to abdominal distention.  No cough.  No hemoptysis. Cardiac:  No chest pain, palpitations, orthopnea, or PND. GI:  Loose stool, sometimes formed.  No nausea, vomiting, constipation, melena or hematochezia. GU:  No urgency, frequency, dysuria, or hematuria. Musculoskeletal:  Severe rheumatoid arthritis.  Osteoporosis.  No back pain. No muscle tenderness. Extremities:  No pain or swelling. Skin:  No rashes or skin changes. Neuro:  No headache, numbness or weakness, balance or coordination issues. Endocrine:  Diabetes.  No thyroid issues, hot flashes or night sweats. Psych:  No mood changes, depression or anxiety. Pain:  No focal pain. Review of systems:  All other systems reviewed and found to be  negative.  Physical Exam: Blood pressure 150/71, pulse 80, temperature 96.9 F (36.1 C), temperature source Tympanic, resp. rate 18, height 5' 3"  (1.6 m), weight 151 lb 10.8 oz (68.8 kg). GENERAL:  Well developed, well nourished, sitting comfortably in the exam room in no acute distress. MENTAL STATUS:  Alert and oriented to person, place and time. HEAD:  Wearing a croquet hat and scarf.  Normocephalic, atraumatic, face symmetric, no Cushingoid features. EYES:  Gold rimmed glasses.  Blue eyes.  No conjunctivitis or scleral icterus.  ENT: Oropharynx clear without lesion. Tongue normal. Mucous membranes moist.  RESPIRATORY: Clear to auscultation without rales, wheezes or rhonchi. CARDIOVASCULAR: Regular rate and rhythm without murmur, rub or gallop. CHEST:  Unremarkable port-a-cath. ABDOMEN: Abdominal distension, improved.  Soft, non-tender with active bowel sounds and no appreciable hepatosplenomegaly. No palpable nodularity or masses. SKIN: SQ hematoma on abdominal wall from old insulin pump site.  No rashes, ulcers or lesions. EXTREMITIES: Bilateral lower extremity edema (right > left).  No skin discoloration or tenderness. No palpable cords. LYMPH NODES: No palpable cervical, supraclavicular, axillary or inguinal adenopathy  NEURO: Somewhat unsteady gait, uses cane PSYCH: Appropriate.  Infusion on 04/20/2015  Component Date Value Ref Range Status  . WBC 04/20/2015 6.1  3.6 - 11.0 K/uL Final  . RBC 04/20/2015 3.60* 3.80 - 5.20 MIL/uL Final  . Hemoglobin 04/20/2015 10.3* 12.0 - 16.0 g/dL Final  . HCT 04/20/2015 30.9* 35.0 - 47.0 % Final  . MCV 04/20/2015 85.8  80.0 - 100.0 fL Final  . MCH 04/20/2015 28.5  26.0 - 34.0 pg Final  . MCHC 04/20/2015 33.2  32.0 - 36.0 g/dL Final  . RDW 04/20/2015 18.0* 11.5 - 14.5 % Final  . Platelets 04/20/2015 322  150 - 440 K/uL Final  . Neutrophils Relative % 04/20/2015 94   Final  . Neutro Abs 04/20/2015 5.7  1.4 - 6.5 K/uL Final  .  Lymphocytes Relative 04/20/2015 4   Final  . Lymphs Abs 04/20/2015 0.3* 1.0 - 3.6 K/uL Final  . Monocytes Relative 04/20/2015 2   Final  . Monocytes Absolute 04/20/2015 0.1* 0.2 - 0.9 K/uL Final  . Eosinophils  Relative 04/20/2015 0   Final  . Eosinophils Absolute 04/20/2015 0.0  0 - 0.7 K/uL Final  . Basophils Relative 04/20/2015 0   Final  . Basophils Absolute 04/20/2015 0.0  0 - 0.1 K/uL Final  . Sodium 04/20/2015 129* 135 - 145 mmol/L Final  . Potassium 04/20/2015 4.4  3.5 - 5.1 mmol/L Final  . Chloride 04/20/2015 96* 101 - 111 mmol/L Final  . CO2 04/20/2015 23  22 - 32 mmol/L Final  . Glucose, Bld 04/20/2015 455* 65 - 99 mg/dL Final  . BUN 04/20/2015 24* 6 - 20 mg/dL Final  . Creatinine, Ser 04/20/2015 0.73  0.44 - 1.00 mg/dL Final  . Calcium 04/20/2015 9.3  8.9 - 10.3 mg/dL Final  . Total Protein 04/20/2015 7.4  6.5 - 8.1 g/dL Final  . Albumin 04/20/2015 3.2* 3.5 - 5.0 g/dL Final  . AST 04/20/2015 22  15 - 41 U/L Final  . ALT 04/20/2015 16  14 - 54 U/L Final  . Alkaline Phosphatase 04/20/2015 66  38 - 126 U/L Final  . Total Bilirubin 04/20/2015 0.5  0.3 - 1.2 mg/dL Final  . GFR calc non Af Amer 04/20/2015 >60  >60 mL/min Final  . GFR calc Af Amer 04/20/2015 >60  >60 mL/min Final   Comment: (NOTE) The eGFR has been calculated using the CKD EPI equation. This calculation has not been validated in all clinical situations. eGFR's persistently <60 mL/min signify possible Chronic Kidney Disease.   . Anion gap 04/20/2015 10  5 - 15 Final  . Magnesium 04/20/2015 1.6* 1.7 - 2.4 mg/dL Final    Assessment:  [name] is a 69 y.o. female with clinical stage IIIC (T3cN1Mx) ovarian cancer presenting with abdominal discomfort and bloating.  Omental biopsy on 02/23/2015 revealed metastatic high grade serous carcinoma, consistent with gynecologic origin.  CA125 was 707 on 02/17/2015.  Abdomen and pelvic CT scan on 02/13/2015 revealed bilateral mass-like adnexal regions (right adnexal  mass 5.6 x 5.0 cm and the left adnexa mass 10.3 x 6.0 x 9.9 cm).  There was a large amount of soft tissue throughout the peritoneal cavity involving the omentum and other peritoneal surfaces.  There was a small volume ascites. There was a peripheral 3.3 x 1.9 cm low-attenuation lesion overlying the right lobe of the liver , likely representing a serosal implant. There was 1.4 x 2.2 cm ill-defined peripheral lesion within the inferior aspect of segment 6 adjacent to the ampulla in the duodenum.   She has severe rheumatoid arthritis.  Methotrexate and Enbrel are on hold.  She has a normocytic anemia.  Work-up on 02/17/2015 revealed a normal ferritin, B12, folate, TSH.  She denies any melena or hematochezia.  She has diabetes and is on an insulin pump.  She is s/p cycle #2 neoadjuvant carboplatin  and Taxol (03/05/2015).CA 125 has declined, so we will continue with current treatment. We will schedule CT of abdomen and pelvis with contrast 2 weeks after cycle 3 of Carbo Taxol, and we'll schedule an appointment with GYN oncology immediately after that. Patient will be considered for debulking surgery, and if considered a reasonable candidate, should proceed with surgery, and then, hopefully, with adjuvant carboplatin and Taxol for at least another 3 cycles. If, however, surgery is not to be performed, we shall continue with the current regimen. If neuropathy continues to persist or worsen, we will consider dose reduction by 20% of Taxol.  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 Patient does not appear to benefit  from increased dose of Neurontin, so we will attempt to switch her to Lyrica 75 mg twice a day. If neuropathy continues to persist for some, we will consider dose reduction for Taxol.  Hypomagnesemia-has improved, previously oral supplementation didn't seem to benefit patient. We will continue with IV supplementation as needed.     Plan: 1.  cycle 3 of carboplatin and Taxol  today 2. CT of abdomen and pelvis in 2 weeks 3. Follow-up appointment with GYN oncology after CT of abdomen and pelvis. 4. Return to the clinic in 3 weeks with plan to continue chemotherapy, if surgery is delayed or canceled. Roxana Hires, MD  10:29 AM 04/20/2015

## 2015-04-28 ENCOUNTER — Ambulatory Visit
Admission: RE | Admit: 2015-04-28 | Discharge: 2015-04-28 | Disposition: A | Discharge status: Home / Self Care | Payer: Medicare Other | Source: Ambulatory Visit | Attending: Hematology and Oncology | Admitting: Hematology and Oncology

## 2015-04-28 DIAGNOSIS — K8020 Calculus of gallbladder without cholecystitis without obstruction: Secondary | ICD-10-CM | POA: Diagnosis not present

## 2015-04-28 DIAGNOSIS — R188 Other ascites: Secondary | ICD-10-CM | POA: Diagnosis not present

## 2015-04-28 DIAGNOSIS — C786 Secondary malignant neoplasm of retroperitoneum and peritoneum: Secondary | ICD-10-CM | POA: Diagnosis present

## 2015-04-28 DIAGNOSIS — C801 Malignant (primary) neoplasm, unspecified: Secondary | ICD-10-CM

## 2015-04-28 DIAGNOSIS — C569 Malignant neoplasm of unspecified ovary: Secondary | ICD-10-CM | POA: Insufficient documentation

## 2015-04-28 DIAGNOSIS — J90 Pleural effusion, not elsewhere classified: Secondary | ICD-10-CM | POA: Diagnosis not present

## 2015-04-28 HISTORY — DX: Malignant neoplasm of unspecified ovary: C56.9

## 2015-04-28 HISTORY — DX: Systemic involvement of connective tissue, unspecified: M35.9

## 2015-04-28 MED ORDER — IOHEXOL 300 MG/ML  SOLN
100.0000 mL | Freq: Once | INTRAMUSCULAR | Status: AC | PRN
  Administered 2015-04-28: 100 mL via INTRAVENOUS

## 2015-05-03 HISTORY — PX: COLON SURGERY: SHX602

## 2015-05-06 ENCOUNTER — Inpatient Hospital Stay: Payer: Medicare Other | Attending: Obstetrics and Gynecology | Admitting: Obstetrics and Gynecology

## 2015-05-06 VITALS — BP 135/69 | HR 73 | Temp 98.4°F | Wt 150.0 lb

## 2015-05-06 DIAGNOSIS — E108 Type 1 diabetes mellitus with unspecified complications: Secondary | ICD-10-CM | POA: Diagnosis not present

## 2015-05-06 DIAGNOSIS — I10 Essential (primary) hypertension: Secondary | ICD-10-CM | POA: Diagnosis not present

## 2015-05-06 DIAGNOSIS — M359 Systemic involvement of connective tissue, unspecified: Secondary | ICD-10-CM | POA: Insufficient documentation

## 2015-05-06 DIAGNOSIS — C786 Secondary malignant neoplasm of retroperitoneum and peritoneum: Secondary | ICD-10-CM | POA: Insufficient documentation

## 2015-05-06 DIAGNOSIS — G629 Polyneuropathy, unspecified: Secondary | ICD-10-CM | POA: Insufficient documentation

## 2015-05-06 DIAGNOSIS — L89312 Pressure ulcer of right buttock, stage 2: Secondary | ICD-10-CM | POA: Diagnosis not present

## 2015-05-06 DIAGNOSIS — Z79899 Other long term (current) drug therapy: Secondary | ICD-10-CM | POA: Diagnosis not present

## 2015-05-06 DIAGNOSIS — I5032 Chronic diastolic (congestive) heart failure: Secondary | ICD-10-CM | POA: Insufficient documentation

## 2015-05-06 DIAGNOSIS — Z794 Long term (current) use of insulin: Secondary | ICD-10-CM | POA: Insufficient documentation

## 2015-05-06 DIAGNOSIS — Z7982 Long term (current) use of aspirin: Secondary | ICD-10-CM | POA: Insufficient documentation

## 2015-05-06 DIAGNOSIS — I25810 Atherosclerosis of coronary artery bypass graft(s) without angina pectoris: Secondary | ICD-10-CM | POA: Insufficient documentation

## 2015-05-06 DIAGNOSIS — E871 Hypo-osmolality and hyponatremia: Secondary | ICD-10-CM | POA: Diagnosis not present

## 2015-05-06 DIAGNOSIS — K5900 Constipation, unspecified: Secondary | ICD-10-CM | POA: Diagnosis not present

## 2015-05-06 DIAGNOSIS — Z87891 Personal history of nicotine dependence: Secondary | ICD-10-CM | POA: Diagnosis not present

## 2015-05-06 DIAGNOSIS — I252 Old myocardial infarction: Secondary | ICD-10-CM | POA: Diagnosis not present

## 2015-05-06 DIAGNOSIS — K219 Gastro-esophageal reflux disease without esophagitis: Secondary | ICD-10-CM | POA: Diagnosis not present

## 2015-05-06 DIAGNOSIS — Z862 Personal history of diseases of the blood and blood-forming organs and certain disorders involving the immune mechanism: Secondary | ICD-10-CM | POA: Diagnosis not present

## 2015-05-06 DIAGNOSIS — C569 Malignant neoplasm of unspecified ovary: Secondary | ICD-10-CM | POA: Insufficient documentation

## 2015-05-06 DIAGNOSIS — D709 Neutropenia, unspecified: Secondary | ICD-10-CM | POA: Insufficient documentation

## 2015-05-06 DIAGNOSIS — N959 Unspecified menopausal and perimenopausal disorder: Secondary | ICD-10-CM | POA: Diagnosis not present

## 2015-05-06 DIAGNOSIS — R634 Abnormal weight loss: Secondary | ICD-10-CM | POA: Insufficient documentation

## 2015-05-06 DIAGNOSIS — E785 Hyperlipidemia, unspecified: Secondary | ICD-10-CM | POA: Insufficient documentation

## 2015-05-06 DIAGNOSIS — D72819 Decreased white blood cell count, unspecified: Secondary | ICD-10-CM | POA: Diagnosis not present

## 2015-05-06 DIAGNOSIS — M069 Rheumatoid arthritis, unspecified: Secondary | ICD-10-CM | POA: Diagnosis not present

## 2015-05-06 DIAGNOSIS — Z9641 Presence of insulin pump (external) (internal): Secondary | ICD-10-CM | POA: Diagnosis not present

## 2015-05-06 DIAGNOSIS — C801 Malignant (primary) neoplasm, unspecified: Secondary | ICD-10-CM

## 2015-05-06 NOTE — Progress Notes (Signed)
Gynecologic Oncology Consult Visit   Referring Provider: Lequita Asal, MD  Additional Care Provider: Dr. Harvest Dark.  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    Subjective:  [name] is a 70 y.o. G3P4 female who is here for consideration of interval debulking of advanced serous ovarian cancer.    She has had 3 cycles of carboplatin (AUC 5) /taxol (175 mg per sq M) with Dr Mike Gip, last 12/19.  CA125 dropped from 707 in 10/16 to 569 in 12/19. She tolerated treatment well and her blood counts were very good at the time of last cycle.   She stopped MTX and Enbarel she had been taking for rheumatoid arthritis while on chemotherapy and joint symptoms have been in good control.  Uses Prednisone 83m qd prn and has taken this the last three days.   CT scan 04/27/16 Heterogeneous left adnexal mass measures approximately 4.0 x 6.1 cm, previously approximately 5.0 x 8.5 cm when remeasured. Heterogeneous right ovary measures approximately 3.8 x 4.9 cm, roughly 4.7 x 5.6 cm on the prior exam. Uterus is visualized. Other: Omental caking has improved in the interval but has not fully resolved. Residual scattered areas of omental nodularity and thickening persist. Small ascites, decreased.   Hepatobiliary: Low-attenuation lesion in the periphery of the hepatic dome measures 3.1 cm, stable. A lesion previously seen off the posterior right hepatic lobe is not readily visualized. There is a small amount of fluid in Morison's pouch. Numerous stones are seen in the gallbladder. No biliary ductal dilatation.  IMPRESSION: 1. Interval response to therapy as evidenced by decrease in size of bilateral ovarian masses and improved, but not resolved, peritoneal carcinomatosis. 2. Hepatic dome lesion is unchanged. Previously seen right hepatic lobe lesion is not well-visualized. 3. Small ascites  and small left pleural effusion. Trace right pleural effusion. 4. Cholelithiasis. 5. Nodular lesion at the ampulla of Vater, extending into the duodenum, stable.  H[facility]: [facility] 2-3 week history of diffuse abdominal discomfort and bloating.    Abdomen and pelvic CT scan on 02/13/2015 revealed bilateral mass-like adnexal regions. The right adnexal region measured approximately 5.6 x 5.0 cm posterior to the uterus, and the left adnexa demonstrated a large mass measuring approximately 10.3 x 6.0 x 9.9 cm, with heterogeneous internal areas of enhancement and cystic change. This appeared intimately associated with the adjacent proximal sigmoid colon. There was a large amount of soft tissue throughout the peritoneal cavity involving the omentum and other peritoneal surfaces, compatible with widespread intraperitoneal metastatic disease. There was a small volume of presumably malignant ascites. There was a peripheral low-attenuation lesion overlying the right lobe of the liver measuring approximately 3.3 x 1.9 cm, favored to represent a serosal implant. There was a similar appearing 1.4 x 2.2 cm ill-defined peripheral lesion within the inferior aspect of segment 6 adjacent to the ampulla in the duodenum. There was no lytic or blastic lesions. She had a chronic T12 compression fracture.  She was seen in consultation by Dr STheora Gianottifrom Dr. CMike Gipfor bilateral ovarian masses with associated peritoneal metastatic disease.   02/23/2015 CT biopsy high grade serous carcinoma.  Decision was made for neoadjuvant chemotherapy with carboplatin/taxol.  Regarding health maintenance issues, she has had 2 colonoscopies. Last mammogram was in 11/2013.   See ROS for pertinent symptoms   Problem List: Patient Active Problem List  Diagnosis Date Noted  . Bilateral lower extremity edema 03/30/2015  . Hypomagnesemia 03/05/2015  . Diarrhea 02/26/2015   . Peritoneal carcinomatosis ([place]) 02/17/2015  . Anemia 02/17/2015  . Adnexal mass 02/13/2015  . Angina pectoris associated with type 2 diabetes mellitus ([place]) 02/10/2015  . History of esophageal stricture 07/08/2014  . Dysphagia, pharyngoesophageal phase 07/08/2014  . Candida esophagitis ([place]) 05/15/2014  . Cataracts, bilateral 01/13/2014  . Medicare annual wellness visit, subsequent 09/20/2013  . CAD (coronary artery disease) of artery bypass graft 11/05/2012  . Anemia, iron deficiency 10/09/2012  . CAD (coronary artery disease) 10/01/2012  . Hearing loss 07/06/2011  . Diabetes type 1, controlled ([place]) 07/06/2011  . Gastroesophageal reflux disease with stricture 07/06/2011  . GERD (gastroesophageal reflux disease) 04/05/2011  . Osteoporosis, unspecified 04/05/2011  . Hypertension 01/10/2011  . Hyperlipidemia 01/10/2011  . RA (rheumatoid arthritis) ([place]) 01/10/2011   Past Medical History: Past Medical History  Diagnosis Date  . Leukopenia 2012    s/p bone marrow biopsy, Dr. Ma Hillock  . Cholelithiasis   . GERD (gastroesophageal reflux disease)   . Hypertension   . Hyperlipidemia   . Esophageal stricture   . Chronic diastolic CHF (congestive heart failure) ([place])   . CAD (coronary artery disease)     a. 09/2012 Cath: LM nl, LAD 95p, 86m LCX 968mOM2 50, RCA 100.  . Marland Kitchenypokalemia   . Herniated disc   . Pneumonia 2013; 08/2012    "one lung; double" (10/18/2012)  . Exertional shortness of breath   . Type I diabetes mellitus ([facility]    "dx'd in 1957" (10/18/2012)  . History of pancytopenia   . H/O hiatal hernia   . Rheumatoid arthritis(714.0)   . NSTEMI (non-ST elevated myocardial infarction) ([facility]    "mild" (10/18/2012)  . Collagen vascular disease ([facility]  . [facility]    Past Surgical History: Past Surgical History  Procedure Laterality Date  . Esophagogastroduodenoscopy  2012    Dr. IfMinna Merritts. Tubal ligation  1970  . Cataract extraction w/ intraocular  lens implant Right 2010  . Cardiac catheterization  10/18/2012    "first one was today" (10/18/2012)  . Esophageal dilation      "3 or 4 times" (10/19/2011)  . Coronary artery bypass graft N/A 10/19/2012    Procedure: CORONARY ARTERY BYPASS GRAFTING (CABG);  Surgeon: StMelrose NakayamaMD;  [facility] Service: Open Heart Surgery;  Laterality: N/A;  . Peripheral vascular catheterization N/A 03/02/2015    Procedure: Porta Cath Insertion;  Surgeon: JaAlgernon HuxleyMD;  [facility];  Service: Cardiovascular;  Laterality: N/A;    Past Gynecologic History:  Postmenopausal She has not had a GYN evaluation since age 70   OB History:  G3P3  1. SVD - neonatal death 2. SVD - singleton 3. SVD - twins  Family History: Family History  Problem Relation Age of Onset  . Diabetes Mother   . Arthritis Mother   . Diabetes Father   . Arthritis Father   . Bone cancer Sister     Social History: Social History   Social History  . Marital Status: Married    Spouse Name: N/A  . Number of Children: 3  . Years of Education: N/A   Occupational History  . Retired    Social History Main Topics  . Smoking status: Former Smoker -- 1.00 packs/day for 30 years    Types: Cigarettes    Quit date:  06/11/1994  . Smokeless tobacco: Never Used  . Alcohol Use: No  . Drug Use: No  . Sexual Activity: No   Other Topics Concern  . Not on file   Social History Narrative    Allergies: Allergies  Allergen Reactions  . Codeine     nausea    Current Medications: Current Outpatient Prescriptions  Medication Sig Dispense Refill  . amLODipine (NORVASC) 5 MG tablet Take 1 tablet (5 mg total) by mouth daily. 90 tablet 1  . aspirin 81 MG tablet Take 81 mg by mouth daily.    Marland Kitchen atorvastatin (LIPITOR) 80 MG tablet Take 1 tablet (80 mg total) by mouth daily. 90 tablet 3  . Cholecalciferol (VITAMIN D) 2000 UNITS CAPS Take 1 capsule by mouth daily.    Marland Kitchen dexamethasone (DECADRON) 4 MG  tablet 20 mg (5 tablets) 12 hours and 6 hours prior to chemotherapy 30 tablet 1  . folic acid (FOLVITE) 1 MG tablet Take 1 tablet (1 mg total) by mouth daily. 90 tablet 3  . ibuprofen (ADVIL,MOTRIN) 800 MG tablet Take 1 tablet (800 mg total) by mouth 3 (three) times daily as needed. 270 tablet 3  . insulin lispro (HUMALOG) 100 UNIT/ML injection As directed via Insulin pump 30 mL 5  . lidocaine-prilocaine (EMLA) cream Apply 1 application topically as needed. Apply application 1 hour prior to treatment.  Cover with Press n seal until treatment time. 30 g 1  . loratadine (CLARITIN) 10 MG tablet Take 10 mg by mouth daily.    Marland Kitchen LORazepam (ATIVAN) 0.5 MG tablet Take 1 tablet (0.5 mg total) by mouth every 6 (six) hours as needed (nausea). 30 tablet 0  . losartan (COZAAR) 100 MG tablet Take 1 tablet (100 mg total) by mouth daily. 90 tablet 3  . metoprolol tartrate (LOPRESSOR) 25 MG tablet Take 1 tablet (25 mg total) by mouth 2 (two) times daily as needed. 180 tablet 3  . Multiple Vitamin (MULTIVITAMIN) tablet Take 1 tablet by mouth daily.      . ondansetron (ZOFRAN) 8 MG tablet Take 1 tablet (8 mg total) by mouth every 8 (eight) hours as needed for nausea or vomiting. 20 tablet 1  . pantoprazole (PROTONIX) 40 MG tablet Take 1 tablet (40 mg total) by mouth 2 (two) times daily. (Patient taking differently: Take 40 mg by mouth daily. ) 180 tablet 3  . predniSONE (DELTASONE) 5 MG tablet Take by mouth.    . pregabalin (LYRICA) 75 MG capsule Take 75 mg by mouth 2 (two) times daily.    . ranitidine (ZANTAC) 150 MG tablet Take 1 tablet (150 mg total) by mouth 2 (two) times daily. 180 tablet 2  . zoledronic acid (RECLAST) 5 MG/100ML SOLN Inject 5 mg into the vein once.    . etanercept (ENBREL) 25 MG injection Inject 25 mg into the skin once a week. Reported on 05/06/2015    . ezetimibe (ZETIA) 10 MG tablet Take 1 tablet (10 mg total) by mouth daily. (Patient not taking: Reported on 05/06/2015) 30 tablet 11  .  furosemide (LASIX) 40 MG tablet Take 1 tablet (40 mg total) by mouth 2 (two) times daily. (Patient not taking: Reported on 05/06/2015) 180 tablet 3  . gabapentin (NEURONTIN) 300 MG capsule Take 1 capsule (300 mg total) by mouth at bedtime. (Patient not taking: Reported on 05/06/2015) 30 capsule 1  . magnesium oxide (MAG-OX) 400 MG tablet Take 400 mg by mouth daily. Reported on 05/06/2015    . methotrexate (RHEUMATREX)  2.5 MG tablet Take 4 tablets (10 mg total) by mouth once a week. Takes every Wednesday.Caution:Chemotherapy. Protect from light. (Patient not taking: Reported on 05/06/2015) 64 tablet 3  . potassium chloride SA (K-DUR,KLOR-CON) 10 MEQ tablet Take 1 tablet (10 mEq total) by mouth 2 (two) times daily. (Patient not taking: Reported on 05/06/2015) 20 tablet 0  . saccharomyces boulardii (FLORASTOR) 250 MG capsule Take 250 mg by mouth as needed. Reported on 05/06/2015     No current facility-administered medications for this visit.   Facility-Administered Medications Ordered in Other Visits  Medication Dose Route Frequency Provider Last Rate Last Dose  . sodium chloride 0.9 % injection 10 mL  10 mL Intracatheter PRN Lequita Asal, MD      . sodium chloride 0.9 % injection 10 mL  10 mL Intravenous PRN Lequita Asal, MD   10 mL at 04/13/15 2774    Review of Systems General: fevers aroudn 100.3 Skin: negative for changes in color, texture, moles or lesions HEENT: negative for, change in hearing, pain, discharge, tinnitus, vertigo, voice changes, sore throat, neck masses Pulmonary: dyspnea and dry cough for a couple of weeks Cardiac: negative for, palpitations, syncope, pain, discomfort, pressure Gastrointestinal: nausea, diarrhea  Several weeks. Diarrhea less than 6 episodes a day Genitourinary/Sexual: negative for, dysuria, hesitancy, nocturia, incontinence Ob/Gyn: negative for, irregular bleeding, pain Musculoskeletal: swelling feet for a couple of weeks Hematology: negative for, easy  bruising, bleeding  Objective:  Physical Examination:  BP 135/69 mmHg  Pulse 73  Temp(Src) 98.4 F (36.9 C) (Oral)  Wt 150 lb 0.4 oz (68.05 kg)  Body mass index is 26.58 kg/(m^2).  ECOG Performance Status: 1 - Symptomatic but completely ambulatory  General appearance: alert, cooperative and appears stated age HEENT:PERRLA, extra ocular movement intact and sclera clear, anicteric Lymph node survey: non-palpable, axillary, inguinal, supraclavicular Cardiovascular: regular rate and rhythm Respiratory: normal air entry, lungs clear to auscultation but decreased breath sounds left posterior lobe Abdomen: nondistended and normal bowel sounds, nontendder, mass palpable in LLQ Back: inspection of back is normal Extremities: extremities normal, atraumatic, no cyanosis or edema Neurological exam reveals alert, oriented, normal speech, no focal findings or movement disorder noted.  Pelvic: exam chaperoned by nurse;  Vulva: normal appearing vulva with no masses, tenderness or lesions; Vagina: normal vagina; Adnexa: mass present on left about 10 cm, Uterus: uterus not grossly enlarged and is nontender, exam limited by habitus; Cervix: no cervical motion tenderness and no lesions; Rectal: confirms mass, no nodularity.   Lab Review Lab Results  Component Value Date   CA125 567.9* 04/13/2015  CA125 10/16 = 707   Lab Results  Component Value Date   WBC 6.1 04/20/2015   HGB 10.3* 04/20/2015   HCT 30.9* 04/20/2015   MCV 85.8 04/20/2015   PLT 322 04/20/2015     Chemistry      Component Value Date/Time   NA 129* 04/20/2015 0846   NA 134* 09/27/2012 0459   K 4.4 04/20/2015 0846   K 4.5 09/27/2012 0459   CL 96* 04/20/2015 0846   CL 100 09/27/2012 0459   CO2 23 04/20/2015 0846   CO2 28 09/27/2012 0459   BUN 24* 04/20/2015 0846   BUN 8 09/27/2012 0459   CREATININE 0.73 04/20/2015 0846   CREATININE 0.60 09/27/2012 0459      Component Value Date/Time   CALCIUM 9.3 04/20/2015 0846    CALCIUM 8.5 09/27/2012 0459   ALKPHOS 66 04/20/2015 0846   ALKPHOS 100 09/21/2012 1210  AST 22 04/20/2015 0846   AST 36 09/21/2012 1210   ALT 16 04/20/2015 0846   ALT 23 09/21/2012 1210   BILITOT 0.5 04/20/2015 0846   BILITOT 0.7 09/21/2012 1210         Assessment:  [name] is a 70 y.o. 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  She has tolerated treatment well, but the CA125 and CT response has not been dramatic.  She has a relatively good performance status despite the medical issues noted below and is a good candidate for interval cytoreductive surgery.  Medical co-morbidities complicating care: HTN, insulin dependent diabetes with SQ infusion pump, CAD, rheumatoid arthritis, asymptomatic gall stones.   Plan:   Problem List Items Addressed This Visit      Genitourinary   Ovarian cancer ([place]) - Primary     Other   Peritoneal carcinomatosis ([place])     We discussed rationale for interval debulking surgery to remove uterus, tubes, ovaries with the ovarian mass as well as debulking.  We will start the surgery laparoscopically, but  laparotomy will most likely be required for debulking.  Surgery will be scheduled for 05/20/15 with Dr Theora Gianotti.  The argon beam coagulator was requested for use in debulking.    I will speak to Dr Bary Castilla in general surgery and ask him to assist with the surgery and post op care.  It is possible she may need a small liver resection to completely debulk the cancer and hopefully he can help with this.  She will require management of her diabetes and may need stress dose corticosteroids in view of her prednisone use.  She will be off of chemotherapy for about 8 weeks and it is possible that her rheumatoid arthritis may flare and require more treatment and this can be determined by her Endocrinologist  Dr. Cynda Familia.   The risks of surgery  were discussed in detail and she understands these to include infection; wound separation; hernia; vaginal cuff separation, injury to adjacent organs such as bowel, bladder, blood vessels, ureters and nerves; possible permanent ostomy; bleeding which may require blood transfusion; anesthesia risk; thromboembolic events; possible death; unforeseen complications; possible need for re-exploration; medical complications such as heart attack, stroke, pleural effusion and pneumonia; and, if staging performed the risk of lymphedema and lymphocyst; malfunction of intraperitoneal port if placed.  The patient will receive DVT and antibiotic prophylaxis as indicated.  She voiced a clear understanding.  She had the opportunity to ask questions and written informed consent was obtained today.  All questions were answered to the patient's and husband's satisfaction.  She understands that she will need to continue with paclitaxel and carboplatin chemotherapy for at least another 3 cycles.  Since she has tolerated treatment well, would increase the carboplatin to AUC 6.    We also discussed that if her CA125 is elevated, but still declining after six cycles we may recommend another 2-3 ccyles. We also discussed that if the CA125 starts to rise there are other drugs such as Doxil and Gemcitabine that would be options.   Genetic testing for BRCA1/2 and other genes is pending.  Finally, we discussed that the median survival for ovarian cancer is about 3-4 years and that most patient are not cured.  In view of this it is important to balance chemotherapy treatment with time off therapy to maximize QOL.   Mellody Drown,  MD  CC:   Referring Provider: Lequita Asal, MD  Additional Care Provider: Dr. Harvest Dark. Dr. Percell Boston

## 2015-05-06 NOTE — Progress Notes (Signed)
Assisted MD with pelvic exam

## 2015-05-12 ENCOUNTER — Other Ambulatory Visit: Payer: Self-pay | Admitting: Internal Medicine

## 2015-05-13 ENCOUNTER — Other Ambulatory Visit: Payer: Medicare Other

## 2015-05-13 ENCOUNTER — Telehealth: Payer: Self-pay | Admitting: Obstetrics and Gynecology

## 2015-05-13 ENCOUNTER — Telehealth: Payer: Self-pay

## 2015-05-13 NOTE — Telephone Encounter (Signed)
I contacted Ms. Mousseau regarding her surgery and I have reviewed her films. Given the extent of disease she is going to undergo surgery at [facility]. She will have cycle #4 of chemotherapy this next week. Plan on preop with me at [facility] in 2-3 weeks with Surgical Oncology consult with Dr. Mariah Milling and [facility] appointment. Anticipate surgery in 4 weeks.  The patient agrees with this plan. I spoke with Dr. Mike Gip and she will arrange for chemotherapy.  Gillis Ends, MD

## 2015-05-13 NOTE — Telephone Encounter (Signed)
  Oncology Nurse Navigator Documentation  Navigator Location: [facility] (05/13/15 1300) Navigator Encounter Type: Telephone (05/13/15 1300) Telephone: [place] Call (05/13/15 1300)         Patient Visit Type: Surgery (05/13/15 1300)   Barriers/Navigation Needs: Coordination of Care (05/13/15 1300)   Interventions: Coordination of Care (05/13/15 1300)                      Time Spent with Patient: 15 (05/13/15 1300)   Spoke with [name] on the phone. No general surgeon availability at [facility] for this case. She will need to have her surgery at [facility]. She is agreeable. [facility] will arrange.

## 2015-05-14 ENCOUNTER — Telehealth: Payer: Self-pay

## 2015-05-14 NOTE — Telephone Encounter (Signed)
Called pt to check on her per MD she will be getting another treatment tomorrow and wanted to check to see if she had steroids.  Per pt she has her steroids and will be taking them like usual today.  No concerns noted.

## 2015-05-15 ENCOUNTER — Other Ambulatory Visit: Payer: Self-pay | Admitting: Hematology and Oncology

## 2015-05-15 ENCOUNTER — Inpatient Hospital Stay (HOSPITAL_BASED_OUTPATIENT_CLINIC_OR_DEPARTMENT_OTHER): Payer: Medicare Other | Admitting: Hematology and Oncology

## 2015-05-15 ENCOUNTER — Other Ambulatory Visit: Payer: Self-pay

## 2015-05-15 ENCOUNTER — Other Ambulatory Visit: Payer: Medicare Other

## 2015-05-15 ENCOUNTER — Inpatient Hospital Stay: Payer: Medicare Other

## 2015-05-15 VITALS — BP 132/52 | HR 78 | Temp 98.1°F | Resp 18 | Ht 63.0 in | Wt 145.9 lb

## 2015-05-15 DIAGNOSIS — M069 Rheumatoid arthritis, unspecified: Secondary | ICD-10-CM

## 2015-05-15 DIAGNOSIS — L89312 Pressure ulcer of right buttock, stage 2: Secondary | ICD-10-CM

## 2015-05-15 DIAGNOSIS — C569 Malignant neoplasm of unspecified ovary: Secondary | ICD-10-CM | POA: Diagnosis not present

## 2015-05-15 DIAGNOSIS — M359 Systemic involvement of connective tissue, unspecified: Secondary | ICD-10-CM

## 2015-05-15 DIAGNOSIS — Z862 Personal history of diseases of the blood and blood-forming organs and certain disorders involving the immune mechanism: Secondary | ICD-10-CM

## 2015-05-15 DIAGNOSIS — C786 Secondary malignant neoplasm of retroperitoneum and peritoneum: Secondary | ICD-10-CM | POA: Diagnosis not present

## 2015-05-15 DIAGNOSIS — D72819 Decreased white blood cell count, unspecified: Secondary | ICD-10-CM

## 2015-05-15 DIAGNOSIS — E108 Type 1 diabetes mellitus with unspecified complications: Secondary | ICD-10-CM

## 2015-05-15 DIAGNOSIS — I10 Essential (primary) hypertension: Secondary | ICD-10-CM

## 2015-05-15 DIAGNOSIS — K219 Gastro-esophageal reflux disease without esophagitis: Secondary | ICD-10-CM

## 2015-05-15 DIAGNOSIS — Z79899 Other long term (current) drug therapy: Secondary | ICD-10-CM

## 2015-05-15 DIAGNOSIS — E871 Hypo-osmolality and hyponatremia: Secondary | ICD-10-CM | POA: Insufficient documentation

## 2015-05-15 DIAGNOSIS — I25810 Atherosclerosis of coronary artery bypass graft(s) without angina pectoris: Secondary | ICD-10-CM

## 2015-05-15 DIAGNOSIS — Z794 Long term (current) use of insulin: Secondary | ICD-10-CM

## 2015-05-15 DIAGNOSIS — Z87891 Personal history of nicotine dependence: Secondary | ICD-10-CM

## 2015-05-15 DIAGNOSIS — Z7982 Long term (current) use of aspirin: Secondary | ICD-10-CM

## 2015-05-15 DIAGNOSIS — R634 Abnormal weight loss: Secondary | ICD-10-CM

## 2015-05-15 DIAGNOSIS — E785 Hyperlipidemia, unspecified: Secondary | ICD-10-CM

## 2015-05-15 DIAGNOSIS — N959 Unspecified menopausal and perimenopausal disorder: Secondary | ICD-10-CM

## 2015-05-15 DIAGNOSIS — L8993 Pressure ulcer of unspecified site, stage 3: Secondary | ICD-10-CM

## 2015-05-15 DIAGNOSIS — I5032 Chronic diastolic (congestive) heart failure: Secondary | ICD-10-CM

## 2015-05-15 DIAGNOSIS — Z9641 Presence of insulin pump (external) (internal): Secondary | ICD-10-CM

## 2015-05-15 DIAGNOSIS — G629 Polyneuropathy, unspecified: Secondary | ICD-10-CM | POA: Insufficient documentation

## 2015-05-15 DIAGNOSIS — I252 Old myocardial infarction: Secondary | ICD-10-CM

## 2015-05-15 LAB — COMPREHENSIVE METABOLIC PANEL
ALT: 16 U/L (ref 14–54)
AST: 15 U/L (ref 15–41)
Albumin: 3.2 g/dL — ABNORMAL LOW (ref 3.5–5.0)
Alkaline Phosphatase: 56 U/L (ref 38–126)
Anion gap: 10 (ref 5–15)
BUN: 32 mg/dL — ABNORMAL HIGH (ref 6–20)
CO2: 22 mmol/L (ref 22–32)
Calcium: 8.8 mg/dL — ABNORMAL LOW (ref 8.9–10.3)
Chloride: 97 mmol/L — ABNORMAL LOW (ref 101–111)
Creatinine, Ser: 0.86 mg/dL (ref 0.44–1.00)
GFR calc Af Amer: >60 mL/min (ref >60)
GFR calc non Af Amer: >60 mL/min (ref >60)
Glucose, Bld: 537 mg/dL (ref 65–99)
Potassium: 4.2 mmol/L (ref 3.5–5.1)
Sodium: 129 mmol/L — ABNORMAL LOW (ref 135–145)
Total Bilirubin: 0.8 mg/dL (ref 0.3–1.2)
Total Protein: 7.3 g/dL (ref 6.5–8.1)

## 2015-05-15 LAB — MAGNESIUM: Magnesium: 1.7 mg/dL (ref 1.7–2.4)

## 2015-05-15 LAB — CBC WITH DIFFERENTIAL/PLATELET
Basophils Absolute: 0 10*3/uL (ref 0–0.1)
Basophils Relative: 0 %
Eosinophils Absolute: 0 10*3/uL (ref 0–0.7)
Eosinophils Relative: 0 %
HCT: 30.3 % — ABNORMAL LOW (ref 35.0–47.0)
Hemoglobin: 10 g/dL — ABNORMAL LOW (ref 12.0–16.0)
Lymphocytes Relative: 6 %
Lymphs Abs: 0.2 10*3/uL — ABNORMAL LOW (ref 1.0–3.6)
MCH: 29 pg (ref 26.0–34.0)
MCHC: 32.9 g/dL (ref 32.0–36.0)
MCV: 87.9 fL (ref 80.0–100.0)
Monocytes Absolute: 0.1 10*3/uL — ABNORMAL LOW (ref 0.2–0.9)
Monocytes Relative: 2 %
Neutro Abs: 4 10*3/uL (ref 1.4–6.5)
Neutrophils Relative %: 92 %
Platelets: 235 10*3/uL (ref 150–440)
RBC: 3.44 MIL/uL — ABNORMAL LOW (ref 3.80–5.20)
RDW: 20.3 % — ABNORMAL HIGH (ref 11.5–14.5)
WBC: 4.3 10*3/uL (ref 3.6–11.0)

## 2015-05-15 MED ORDER — SODIUM CHLORIDE 0.9 % IJ SOLN
10.0000 mL | Freq: Once | INTRAMUSCULAR | Status: AC
  Administered 2015-05-15: 10 mL via INTRAVENOUS
  Filled 2015-05-15: qty 10

## 2015-05-15 MED ORDER — HEPARIN SOD (PORK) LOCK FLUSH 100 UNIT/ML IV SOLN
500.0000 [IU] | Freq: Once | INTRAVENOUS | Status: AC
  Administered 2015-05-15: 500 [IU] via INTRAVENOUS
  Filled 2015-05-15: qty 5

## 2015-05-15 NOTE — Progress Notes (Signed)
[place] [facility] day:  05/15/2015  Chief Complaint: [name] is a 70 y.o. 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    HPI:  The patient was last seen in the medical oncology clinic on 03/30/2015.  At that time, she was seen for assessment prior to cycle #2 carboplatin and Taxol. Symptomatically, her abdominal distension hasd improved.  Her arthritis had flared last week.  She had mild hypomagnesemia.  CA125 was 802.9.  She had a stable grade I-II neuropathy.  She began Neurontin 300 mg po qhs.  She received her chemotherapy uneventfully.  She saw Dr. Rudean Hitt in my absence on 04/20/2015.   She complained of persistent numbness and tingling in her fingers.  She had difficult writing with a pen, but was able to use a spoon and other utensils.  She had recently increased her Neurontin to 300 mg BID.   She was switched to Lyrica 75 mg BID.  She received IV magnesium for hypomagnesemia.  She received her cycle #3 uneventfully.  She underwent restaging abdominal and pelvic CT scan on 04/28/2015.  There was an interval response with decrease in bilateral ovarian masses.   The left adnexal mass measured 4.0 x 6.1 cm (previously 5.0 x 8.5 cm). The right ovary measured 3.8 x 4.9 cm (previously 4.7 x 5.6 cm).  There was improved peritoneal carcinomatosis.  There was a small amount of ascites.  The hepatic dome lesion was stable. Previously seen right hepatic lobe lesion was not well-visualized.  There was a small left pleural effusion and trace right pleural effusion.  The nodular lesion at the ampulla of Vater, extending into the duodenum was stable.  There was no biliary ductal dilatation.  She saw Dr. Fransisca Connors on 05/06/2015.  Surgery was initially scheduled for 05/20/2015 with Dr. Theora Gianotti.  She was felt to possibly need a small liver resection to completley  debulk her disease.  It was recommended that her carboplatin dose be increased to an AUC of 6.  It was also recommended that if her CA125 was elevated, but still declining after 6 cycles, they may recommend another 2-3 cycles.  Dr. Theora Gianotti called me and contacted the patient on 05/13/2015.  Given the extent of disease, she was going to undergo surgery at [facility]. She will have cycle #4 of chemotherapy this next week. She will then have a preop appointment with her at [facility] in 2-3 weeks with Surgical Oncology consult with Dr. Mariah Milling and [facility] appointment. Surgery was anticipated in 4 weeks.   She states that she has lost 4 pounds.  She is eating well and has a good appetite.  Her neuropathy may be a little better.  Sometimes she drops utensils.  She comments that she has had a sore on her bottom for 3 weeks.  She has been sitting on a cushion for 2 weeks.  Past Medical History  Diagnosis Date  . Leukopenia 2012    s/p bone marrow biopsy, Dr. Ma Hillock  . Cholelithiasis   . GERD (gastroesophageal reflux disease)   . Hypertension   . Hyperlipidemia   . Esophageal stricture   . Chronic diastolic CHF (congestive heart failure) ([place])   . CAD (coronary artery disease)     a. 09/2012 Cath: LM nl, LAD 95p, 98m LCX 951mOM2 50, RCA 100.  . Marland Kitchenypokalemia   . Herniated disc   . Pneumonia 2013;  08/2012    "one lung; double" (10/18/2012)  . Exertional shortness of breath   . Type I diabetes mellitus ([place])     "dx'd in 1957" (10/18/2012)  . History of pancytopenia   . H/O hiatal hernia   . Rheumatoid arthritis(714.0)   . NSTEMI (non-ST elevated myocardial infarction) ([place]) 08/2012    "mild" (10/18/2012)  . Collagen vascular disease ([place])   . Ovarian cancer [facility])     Past Surgical History  Procedure Laterality Date  . Esophagogastroduodenoscopy  2012    Dr. Minna Merritts  . Tubal ligation  1970  . Cataract extraction w/ intraocular lens implant Right 2010  . Cardiac catheterization  10/18/2012    "first one was  today" (10/18/2012)  . Esophageal dilation      "3 or 4 times" (10/19/2011)  . Coronary artery bypass graft N/A 10/19/2012    Procedure: CORONARY ARTERY BYPASS GRAFTING (CABG);  Surgeon: Melrose Nakayama, MD;  Location: [facility];  Service: Open Heart Surgery;  Laterality: N/A;  . Peripheral vascular catheterization N/A 03/02/2015    Procedure: Porta Cath Insertion;  Surgeon: Algernon Huxley, MD;  Location: [facility];  Service: Cardiovascular;  Laterality: N/A;    Family History  Problem Relation Age of Onset  . Diabetes Mother   . Arthritis Mother   . Diabetes Father   . Arthritis Father   . Bone cancer Sister     Social History:  reports that she quit smoking about 20 years ago. Her smoking use included Cigarettes. She has a 30 pack-year smoking history. She has never used smokeless tobacco. She reports that she does not drink alcohol or use illicit drugs.  The patient is accompanied by her husband today.  Allergies:  Allergies  Allergen Reactions  . Codeine     nausea    Current Medications: Current Outpatient Prescriptions  Medication Sig Dispense Refill  . amLODipine (NORVASC) 5 MG tablet TAKE 1 TABLET BY MOUTH ONCE A DAY 90 tablet 2  . aspirin 81 MG tablet Take 81 mg by mouth daily.    Marland Kitchen atorvastatin (LIPITOR) 80 MG tablet Take 1 tablet (80 mg total) by mouth daily. 90 tablet 3  . Cholecalciferol (VITAMIN D) 2000 UNITS CAPS Take 1 capsule by mouth daily.    Marland Kitchen dexamethasone (DECADRON) 4 MG tablet 20 mg (5 tablets) 12 hours and 6 hours prior to chemotherapy 30 tablet 1  . etanercept (ENBREL) 25 MG injection Inject 25 mg into the skin once a week. Reported on 3/41/9379    . folic acid (FOLVITE) 1 MG tablet Take 1 tablet (1 mg total) by mouth daily. 90 tablet 3  . furosemide (LASIX) 40 MG tablet Take 1 tablet (40 mg total) by mouth 2 (two) times daily. 180 tablet 3  . ibuprofen (ADVIL,MOTRIN) 800 MG tablet Take 1 tablet (800 mg total) by mouth 3 (three) times daily as  needed. 270 tablet 3  . insulin lispro (HUMALOG) 100 UNIT/ML injection As directed via Insulin pump 30 mL 5  . lidocaine-prilocaine (EMLA) cream Apply 1 application topically as needed. Apply application 1 hour prior to treatment.  Cover with Press n seal until treatment time. 30 g 1  . loratadine (CLARITIN) 10 MG tablet Take 10 mg by mouth daily.    Marland Kitchen LORazepam (ATIVAN) 0.5 MG tablet Take 1 tablet (0.5 mg total) by mouth every 6 (six) hours as needed (nausea). 30 tablet 0  . losartan (COZAAR) 100 MG tablet Take 1 tablet (100 mg  total) by mouth daily. 90 tablet 3  . magnesium oxide (MAG-OX) 400 MG tablet Take 400 mg by mouth daily. Reported on 05/19/2015    . methotrexate (RHEUMATREX) 2.5 MG tablet Take 4 tablets (10 mg total) by mouth once a week. Takes every Wednesday.Caution:Chemotherapy. Protect from light. 64 tablet 3  . metoprolol tartrate (LOPRESSOR) 25 MG tablet Take 1 tablet (25 mg total) by mouth 2 (two) times daily as needed. 180 tablet 3  . Multiple Vitamin (MULTIVITAMIN) tablet Take 1 tablet by mouth daily.      . ondansetron (ZOFRAN) 8 MG tablet Take 1 tablet (8 mg total) by mouth every 8 (eight) hours as needed for nausea or vomiting. 20 tablet 1  . pantoprazole (PROTONIX) 40 MG tablet Take 1 tablet (40 mg total) by mouth 2 (two) times daily. (Patient taking differently: Take 40 mg by mouth daily. ) 180 tablet 3  . potassium chloride SA (K-DUR,KLOR-CON) 10 MEQ tablet Take 1 tablet (10 mEq total) by mouth 2 (two) times daily. 20 tablet 0  . pregabalin (LYRICA) 75 MG capsule Take 75 mg by mouth 2 (two) times daily.    . ranitidine (ZANTAC) 150 MG tablet Take 1 tablet (150 mg total) by mouth 2 (two) times daily. 180 tablet 2  . saccharomyces boulardii (FLORASTOR) 250 MG capsule Take 250 mg by mouth as needed. Reported on 05/06/2015    . zoledronic acid (RECLAST) 5 MG/100ML SOLN Inject 5 mg into the vein once.    . Wound Dressings (MEDIHONEY CA ALGINATE 2"X2") PADS Apply to sacral wound  daily. 10 each 3   No current facility-administered medications for this visit.   Facility-Administered Medications Ordered in Other Visits  Medication Dose Route Frequency Provider Last Rate Last Dose  . sodium chloride 0.9 % injection 10 mL  10 mL Intracatheter PRN Lequita Asal, MD      . sodium chloride 0.9 % injection 10 mL  10 mL Intravenous PRN Lequita Asal, MD   10 mL at 04/13/15 0254    Review of Systems:  GENERAL:  Doing ok.  No fevers or sweats.  Weight down 4 pounds. PERFORMANCE STATUS (ECOG):  1 HEENT:  No visual changes, sore throat, mouth sores or tenderness. Lungs:  No shortness of breath.  No cough.  No hemoptysis. Cardiac:  No chest pain, palpitations, orthopnea, or PND. GI:  No nausea, vomiting, diarrhea, constipation, melena or hematochezia. GU:  No urgency, frequency, dysuria, or hematuria. Musculoskeletal:  Severe rheumatoid arthritis.  Osteoporosis.  No back pain. No muscle tenderness. Extremities:  No pain or swelling. Skin:  No rashes or skin changes. Neuro:  No headache, numbness or weakness, balance or coordination issues. Endocrine:  Diabetes.  No thyroid issues, hot flashes or night sweats. Psych:  No mood changes, depression or anxiety. Pain:  No focal pain. Review of systems:  All other systems reviewed and found to be negative.  Physical Exam: Blood pressure 132/52, pulse 78, temperature 98.1 F (36.7 C), temperature source Tympanic, resp. rate 18, height 5' 3"  (1.6 m), weight 145 lb 15.1 oz (66.2 kg). GENERAL:  Well developed, well nourished, sitting comfortably in the exam room in no acute distress. MENTAL STATUS:  Alert and oriented to person, place and time. HEAD:  Wearing a blue wrap.  Normocephalic, atraumatic, face symmetric, no Cushingoid features. EYES:  Gold rimmed glasses.  Blue eyes.  No conjunctivitis or scleral icterus.  ENT: Oropharynx clear without lesion. Tongue normal. Mucous membranes moist.  RESPIRATORY: Clear to  auscultation without rales, wheezes or rhonchi. CARDIOVASCULAR: Regular rate and rhythm without murmur, rub or gallop. CHEST:  Unremarkable port-a-cath. ABDOMEN: Soft, non-tender with active bowel sounds and no appreciable hepatosplenomegaly. No palpable nodularity or masses. RECTAL:  1.5 cm decubitus ulcer, stage II (deep), with clean border and yellow thin exudate, near right gluteal fold. SKIN: Decubitus ulcer as above.  No other rashes, ulcers or lesions. EXTREMITIES: No edema, skin discoloration or tenderness. No palpable cords. LYMPH NODES: No palpable cervical, supraclavicular, axillary or inguinal adenopathy  NEUROLOGICAL: Slightly unsteady on feet PSYCH: Appropriate.  Infusion on 05/15/2015  Component Date Value Ref Range Status  . WBC 05/15/2015 4.3  3.6 - 11.0 K/uL Final  . RBC 05/15/2015 3.44* 3.80 - 5.20 MIL/uL Final  . Hemoglobin 05/15/2015 10.0* 12.0 - 16.0 g/dL Final  . HCT 05/15/2015 30.3* 35.0 - 47.0 % Final  . MCV 05/15/2015 87.9  80.0 - 100.0 fL Final  . MCH 05/15/2015 29.0  26.0 - 34.0 pg Final  . MCHC 05/15/2015 32.9  32.0 - 36.0 g/dL Final  . RDW 05/15/2015 20.3* 11.5 - 14.5 % Final  . Platelets 05/15/2015 235  150 - 440 K/uL Final  . Neutrophils Relative % 05/15/2015 92   Final  . Neutro Abs 05/15/2015 4.0  1.4 - 6.5 K/uL Final  . Lymphocytes Relative 05/15/2015 6   Final  . Lymphs Abs 05/15/2015 0.2* 1.0 - 3.6 K/uL Final  . Monocytes Relative 05/15/2015 2   Final  . Monocytes Absolute 05/15/2015 0.1* 0.2 - 0.9 K/uL Final  . Eosinophils Relative 05/15/2015 0   Final  . Eosinophils Absolute 05/15/2015 0.0  0 - 0.7 K/uL Final  . Basophils Relative 05/15/2015 0   Final  . Basophils Absolute 05/15/2015 0.0  0 - 0.1 K/uL Final  . Sodium 05/15/2015 129* 135 - 145 mmol/L Final  . Potassium 05/15/2015 4.2  3.5 - 5.1 mmol/L Final  . Chloride 05/15/2015 97* 101 - 111 mmol/L Final  . CO2 05/15/2015 22  22 - 32 mmol/L Final  . Glucose, Bld 05/15/2015 537* 65 - 99  mg/dL Final   Comment: RESULTS VERIFIED BY REPEAT TESTING CRITICAL RESULT CALLED TO, READ BACK BY AND VERIFIED WITH NYO TO [facility] [place] 2536 05/15/15   . BUN 05/15/2015 32* 6 - 20 mg/dL Final  . Creatinine, Ser 05/15/2015 0.86  0.44 - 1.00 mg/dL Final  . Calcium 05/15/2015 8.8* 8.9 - 10.3 mg/dL Final  . Total Protein 05/15/2015 7.3  6.5 - 8.1 g/dL Final  . Albumin 05/15/2015 3.2* 3.5 - 5.0 g/dL Final  . AST 05/15/2015 15  15 - 41 U/L Final  . ALT 05/15/2015 16  14 - 54 U/L Final  . Alkaline Phosphatase 05/15/2015 56  38 - 126 U/L Final  . Total Bilirubin 05/15/2015 0.8  0.3 - 1.2 mg/dL Final  . GFR calc non Af Amer 05/15/2015 >60  >60 mL/min Final  . GFR calc Af Amer 05/15/2015 >60  >60 mL/min Final   Comment: (NOTE) The eGFR has been calculated using the CKD EPI equation. This calculation has not been validated in all clinical situations. eGFR's persistently <60 mL/min signify possible Chronic Kidney Disease.   . Anion gap 05/15/2015 10  5 - 15 Final  . Magnesium 05/15/2015 1.7  1.7 - 2.4 mg/dL Final  . CA 125 05/15/2015 168.8* 0.0 - 38.1 U/mL Final   Comment: (NOTE) Roche ECLIA methodology Performed At: [facility] [place], [place] [id number] Lindon Romp MD VZ:[id number]  Assessment:  [name] is a 70 y.o. female with clinical stage IIIC (T3cN1Mx) ovarian cancer presenting with abdominal discomfort and bloating.  Omental biopsy on 02/23/2015 revealed metastatic high grade serous carcinoma, consistent with gynecologic origin.  CA125 was 707 on 02/17/2015.  Abdomen and pelvic CT scan on 02/13/2015 revealed bilateral mass-like adnexal regions (right adnexal mass 5.6 x 5.0 cm and the left adnexa mass 10.3 x 6.0 x 9.9 cm).  There was a large amount of soft tissue throughout the peritoneal cavity involving the omentum and other peritoneal surfaces.  There was a small volume ascites. There was a peripheral 3.3 x 1.9 cm low-attenuation lesion  overlying the right lobe of the liver , likely representing a serosal implant. There was 1.4 x 2.2 cm ill-defined peripheral lesion within the inferior aspect of segment 6 adjacent to the ampulla in the duodenum.   She has severe rheumatoid arthritis.  Methotrexate and Enbrel are on hold.  She has a normocytic anemia.  Work-up on 02/17/2015 revealed a normal ferritin, B12, folate, TSH.  She denies any melena or hematochezia.  She has diabetes and is on an insulin pump.  She is s/p 3 cycles of neoadjuvant carboplatin and Taxol (03/05/2015 - 04/20/2015).  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  She had loose stools on oral magnesium.  She was initially on Neurontin then switched Lyrica with cycle #3.  CA125 was 802.9 on 03/30/2015, 567.9 on 04/13/2015, and 168.8 on 05/15/2015.  Abdominal and pelvic CT scan on 04/28/2015 revealed decreasing bilateral ovarian masses.   The left adnexal mass measured 4.0 x 6.1 cm (previously 5.0 x 8.5 cm). The right ovary measured 3.8 x 4.9 cm (previously 4.7 x 5.6 cm).  There was improved peritoneal carcinomatosis.  There was a small amount of ascites.  The hepatic dome lesion was stable. The previously seen right hepatic lobe lesion was not well-visualized.  There was a small left pleural effusion and trace right pleural effusion.  The nodular lesion at the ampulla of Vater, extending into the duodenum was stable.  There was no biliary ductal dilatation.  Symptomatically, her neuropathy has slightly improved.  She has a 1.5 cm deep stage II right gluteal fold decubitus ulcer.  Plan: 1.  Labs today:  CBC with diff, CMP, Mg, CA125. 2.  Discuss conversation with Dr. Theora Gianotti and plan for 1 more cycle of chemotherapy prior to surgery.  Discuss increasing carboplatin dose and risk of neutropenia.  Discuss nadir counts on day 10 and possible use of GCSF (Neupogen).  Discuss Neulasta.  Preauth growth factor support. 3.  Postpone chemotherapy today. 4.  [facility] today. 5.  RTC on 05/21/2015 for MD assess, labs (CBC with diff, CMP, Mg), and possible cycle #4 carboplatin and Taxol on 05/22/2015.    Lequita Asal, MD  05/15/2015

## 2015-05-16 LAB — CA 125: CA 125: 168.8 U/mL — ABNORMAL HIGH (ref 0.0–38.1)

## 2015-05-18 ENCOUNTER — Ambulatory Visit: Payer: Medicare Other

## 2015-05-19 ENCOUNTER — Ambulatory Visit (INDEPENDENT_AMBULATORY_CARE_PROVIDER_SITE_OTHER): Payer: Medicare Other | Admitting: Internal Medicine

## 2015-05-19 ENCOUNTER — Encounter: Payer: Self-pay | Admitting: Internal Medicine

## 2015-05-19 VITALS — BP 108/60 | HR 75 | Temp 98.0°F | Ht 63.0 in | Wt 147.4 lb

## 2015-05-19 DIAGNOSIS — L8990 Pressure ulcer of unspecified site, unspecified stage: Secondary | ICD-10-CM

## 2015-05-19 DIAGNOSIS — E1059 Type 1 diabetes mellitus with other circulatory complications: Secondary | ICD-10-CM | POA: Diagnosis not present

## 2015-05-19 DIAGNOSIS — C569 Malignant neoplasm of unspecified ovary: Secondary | ICD-10-CM

## 2015-05-19 MED ORDER — "MEDIHONEY CA ALGINATE 2""X2"" EX PADS": MEDICATED_PAD | CUTANEOUS | Status: DC

## 2015-05-19 NOTE — Assessment & Plan Note (Signed)
Ulceration noted. Will start Medihoney Ca-Alginate daily. Will try to expedite referral to wound center.

## 2015-05-19 NOTE — Assessment & Plan Note (Signed)
A1c 7.7% at [facility]. Continue to follow up with Endocrinology. Continue Insulin pump.

## 2015-05-19 NOTE — Patient Instructions (Signed)
Apply Medihoney dressing to wound daily. We will set up evaluation with the [facility].  Follow up 4 weeks and sooner as needed.

## 2015-05-19 NOTE — Progress Notes (Signed)
Pre visit review using our clinic review tool, if applicable. No additional management support is needed unless otherwise documented below in the visit note. 

## 2015-05-19 NOTE — Progress Notes (Signed)
Subjective:    Patient ID: [name], female    DOB: 1945-08-11, 70 y.o.   MRN: [id number]  HPI  70YO female presents for follow up.  Ovarian Cancer - Followed by Dr. Fransisca Connors. Has one more round of chemotherapy, followed by surgical resection.  Wound - on buttock near rectum. Because of this unable to have chemo. Would like to have this rechecked today.  DM - recent A1c was 7.7%. BG have been higher, as she has been on steroids. Continues on insulin pump.  Feeling well. Notes some weight loss. Appetite okay. No current nausea. Some constipation. Uses Colace with some improvement.  Wt Readings from Last 3 Encounters:  05/19/15 147 lb 6 oz (66.849 kg)  05/15/15 145 lb 15.1 oz (66.2 kg)  05/06/15 150 lb 0.4 oz (68.05 kg)   BP Readings from Last 3 Encounters:  05/19/15 108/60  05/15/15 132/52  05/06/15 135/69    Past Medical History  Diagnosis Date  . Leukopenia 2012    s/p bone marrow biopsy, Dr. Ma Hillock  . Cholelithiasis   . GERD (gastroesophageal reflux disease)   . Hypertension   . Hyperlipidemia   . Esophageal stricture   . Chronic diastolic CHF (congestive heart failure) ([place])   . CAD (coronary artery disease)     a. 09/2012 Cath: LM nl, LAD 95p, 90m LCX 984mOM2 50, RCA 100.  . Marland Kitchenypokalemia   . Herniated disc   . Pneumonia 2013; 08/2012    "one lung; double" (10/18/2012)  . Exertional shortness of breath   . Type I diabetes mellitus ([facility] [place]    "dx'd in 1957" (10/18/2012)  . History of pancytopenia   . H/O hiatal hernia   . Rheumatoid arthritis(714.0)   . NSTEMI (non-ST elevated myocardial infarction) ([facility]    "mild" (10/18/2012)  . Collagen vascular disease ([facility]  . [place]ian cancer ([facility]   Family History  Problem Relation Age of Onset  . Diabetes Mother   . Arthritis Mother   . Diabetes Father   . Arthritis Father   . Bone cancer Sister    Past Surgical History  Procedure Laterality Date  . Esophagogastroduodenoscopy  2012    Dr. IfMinna Merritts  . Tubal ligation  1970  . Cataract extraction w/ intraocular lens implant Right 2010  . Cardiac catheterization  10/18/2012    "first one was today" (10/18/2012)  . Esophageal dilation      "3 or 4 times" (10/19/2011)  . Coronary artery bypass graft N/A 10/19/2012    Procedure: CORONARY ARTERY BYPASS GRAFTING (CABG);  Surgeon: StMelrose NakayamaMD;  [place] [facility] Service: Open [facility] Surgery;  Laterality: N/A;  . Peripheral vascular catheterization N/A 03/02/2015    Procedure: Porta Cath Insertion;  Surgeon: JaAlgernon HuxleyMD;  [place];  Service: Cardiovascular;  Laterality: N/A;   Social History   Social History  . Marital Status: Married    Spouse Name: N/A  . Number of Children: 3  . Years of Education: N/A   Occupational History  . Retired    Social History Main Topics  . Smoking status: Former Smoker -- 1.00 packs/day for 30 years    Types: Cigarettes    Quit date: 06/11/1994  . Smokeless tobacco: Never Used  . Alcohol Use: No  . Drug Use: No  . Sexual Activity: No   Other Topics Concern  . None   Social History Narrative    Review of  Systems  Constitutional: Positive for fatigue. Negative for fever, chills, appetite change and unexpected weight change.  Eyes: Negative for visual disturbance.  Respiratory: Negative for shortness of breath.   Cardiovascular: Negative for chest pain and leg swelling.  Gastrointestinal: Positive for constipation and rectal pain. Negative for nausea, vomiting, abdominal pain and diarrhea.  Musculoskeletal: Positive for myalgias.  Skin: Positive for color change and wound. Negative for rash.  Neurological: Positive for weakness.  Hematological: Negative for adenopathy. Does not bruise/bleed easily.  Psychiatric/Behavioral: Negative for dysphoric mood. The patient is not nervous/anxious.        Objective:    BP 108/60 mmHg  Pulse 75  Temp(Src) 98 F (36.7 C) (Oral)  Ht 5' 3"  (1.6 m)  Wt 147 lb 6 oz  (66.849 kg)  BMI 26.11 kg/m2  SpO2 98% Physical Exam  Constitutional: She is oriented to person, place, and time. She appears well-developed and well-nourished. No distress.  HENT:  Head: Normocephalic and atraumatic.  Right Ear: External ear normal.  Left Ear: External ear normal.  Nose: Nose normal.  Mouth/Throat: Oropharynx is clear and moist. No oropharyngeal exudate.  Eyes: Conjunctivae are normal. Pupils are equal, round, and reactive to light. Right eye exhibits no discharge. Left eye exhibits no discharge. No scleral icterus.  Neck: Normal range of motion. Neck supple. No tracheal deviation present. No thyromegaly present.  Cardiovascular: Normal rate, regular rhythm, normal heart sounds and intact distal pulses.  Exam reveals no gallop and no friction rub.   No murmur heard. Pulmonary/Chest: Effort normal and breath sounds normal. No respiratory distress. She has no wheezes. She has no rales. She exhibits no tenderness.  Genitourinary:     Musculoskeletal: Normal range of motion. She exhibits no edema or tenderness.  Lymphadenopathy:    She has no cervical adenopathy.  Neurological: She is alert and oriented to person, place, and time. No cranial nerve deficit. She exhibits normal muscle tone. Coordination normal.  Skin: Skin is warm and dry. No rash noted. She is not diaphoretic. No erythema. No pallor.  Psychiatric: She has a normal mood and affect. Her behavior is normal. Judgment and thought content normal.          Assessment & Plan:   Problem List Items Addressed This Visit      Unprioritized   Decubitus ulcer    Ulceration noted. Will start Medihoney Ca-Alginate daily. Will try to expedite referral to wound center.      Relevant Medications   Wound Dressings (MEDIHONEY CA ALGINATE 2"X2") PADS   Diabetes type 1, controlled ([place]) - Primary    A1c 7.7% at [facility]. Continue to follow up with Endocrinology. Continue Insulin pump.      Ovarian cancer  [facility])    Reviewed notes from Oncology. Planning for chemo once decubitus ulceration healed.           Return in about 4 weeks (around 06/16/2015), or if symptoms worsen or fail to improve, for Recheck.

## 2015-05-19 NOTE — Assessment & Plan Note (Signed)
Reviewed notes from Oncology. Planning for chemo once decubitus ulceration healed.

## 2015-05-20 ENCOUNTER — Inpatient Hospital Stay: Admission: RE | Admit: 2015-05-20 | Payer: Medicare Other | Source: Ambulatory Visit

## 2015-05-20 ENCOUNTER — Encounter: Admission: RE | Payer: Self-pay | Source: Ambulatory Visit

## 2015-05-20 ENCOUNTER — Telehealth: Payer: Self-pay | Admitting: Hematology and Oncology

## 2015-05-20 ENCOUNTER — Telehealth: Payer: Self-pay | Admitting: *Deleted

## 2015-05-20 SURGERY — HYSTERECTOMY, ABDOMINAL
Anesthesia: Choice

## 2015-05-20 NOTE — Telephone Encounter (Signed)
Please call to clarify order. Call: [phone]

## 2015-05-20 NOTE — Telephone Encounter (Signed)
  [name],  Is this what you are calling about?  M

## 2015-05-20 NOTE — Telephone Encounter (Signed)
They are unable to get her an appointment before Monday 05/25/15 and have scheduled her for that day at 1:30pm. They said  is already sending the referral and they will contact patient with appointment.

## 2015-05-20 NOTE — Telephone Encounter (Signed)
States she was to be referred to [facility] regarding ulcer on her bottom, but sh has not heard from the wound clinic regarding an appt as of yet and she is scheduled to return to see Dr Mike Gip tomorrow. Wanted Brandy to be aware that she has not been seen at Wound care yet.

## 2015-05-20 NOTE — Telephone Encounter (Signed)
  Please forward the number to me so I can talk to them.  M

## 2015-05-20 NOTE — Telephone Encounter (Signed)
Returned message on orders.  The soonest pt can be seen will be Monday when they have a bigger room available.  Thank you.

## 2015-05-20 NOTE — Telephone Encounter (Signed)
  Brandy,  Please call wound care.  Needs to be seen today.  Possible chemo this week.  M

## 2015-05-21 ENCOUNTER — Inpatient Hospital Stay (HOSPITAL_BASED_OUTPATIENT_CLINIC_OR_DEPARTMENT_OTHER): Payer: Medicare Other | Admitting: Hematology and Oncology

## 2015-05-21 ENCOUNTER — Other Ambulatory Visit: Payer: Self-pay

## 2015-05-21 ENCOUNTER — Encounter: Payer: Self-pay | Admitting: Hematology and Oncology

## 2015-05-21 ENCOUNTER — Inpatient Hospital Stay: Payer: Medicare Other

## 2015-05-21 VITALS — BP 108/64 | HR 78 | Temp 98.2°F | Resp 18 | Ht 63.0 in | Wt 145.3 lb

## 2015-05-21 DIAGNOSIS — E785 Hyperlipidemia, unspecified: Secondary | ICD-10-CM

## 2015-05-21 DIAGNOSIS — L89312 Pressure ulcer of right buttock, stage 2: Secondary | ICD-10-CM

## 2015-05-21 DIAGNOSIS — Z794 Long term (current) use of insulin: Secondary | ICD-10-CM

## 2015-05-21 DIAGNOSIS — Z9641 Presence of insulin pump (external) (internal): Secondary | ICD-10-CM

## 2015-05-21 DIAGNOSIS — C569 Malignant neoplasm of unspecified ovary: Secondary | ICD-10-CM

## 2015-05-21 DIAGNOSIS — I10 Essential (primary) hypertension: Secondary | ICD-10-CM

## 2015-05-21 DIAGNOSIS — N959 Unspecified menopausal and perimenopausal disorder: Secondary | ICD-10-CM

## 2015-05-21 DIAGNOSIS — Z7982 Long term (current) use of aspirin: Secondary | ICD-10-CM

## 2015-05-21 DIAGNOSIS — C786 Secondary malignant neoplasm of retroperitoneum and peritoneum: Secondary | ICD-10-CM

## 2015-05-21 DIAGNOSIS — K219 Gastro-esophageal reflux disease without esophagitis: Secondary | ICD-10-CM

## 2015-05-21 DIAGNOSIS — M359 Systemic involvement of connective tissue, unspecified: Secondary | ICD-10-CM

## 2015-05-21 DIAGNOSIS — Z87891 Personal history of nicotine dependence: Secondary | ICD-10-CM

## 2015-05-21 DIAGNOSIS — C801 Malignant (primary) neoplasm, unspecified: Secondary | ICD-10-CM

## 2015-05-21 DIAGNOSIS — G629 Polyneuropathy, unspecified: Secondary | ICD-10-CM

## 2015-05-21 DIAGNOSIS — E108 Type 1 diabetes mellitus with unspecified complications: Secondary | ICD-10-CM

## 2015-05-21 DIAGNOSIS — I25810 Atherosclerosis of coronary artery bypass graft(s) without angina pectoris: Secondary | ICD-10-CM

## 2015-05-21 DIAGNOSIS — R634 Abnormal weight loss: Secondary | ICD-10-CM

## 2015-05-21 DIAGNOSIS — E871 Hypo-osmolality and hyponatremia: Secondary | ICD-10-CM | POA: Diagnosis not present

## 2015-05-21 DIAGNOSIS — Z79899 Other long term (current) drug therapy: Secondary | ICD-10-CM

## 2015-05-21 DIAGNOSIS — I5032 Chronic diastolic (congestive) heart failure: Secondary | ICD-10-CM

## 2015-05-21 DIAGNOSIS — I252 Old myocardial infarction: Secondary | ICD-10-CM

## 2015-05-21 DIAGNOSIS — L8990 Pressure ulcer of unspecified site, unspecified stage: Secondary | ICD-10-CM

## 2015-05-21 DIAGNOSIS — D72819 Decreased white blood cell count, unspecified: Secondary | ICD-10-CM

## 2015-05-21 DIAGNOSIS — Z862 Personal history of diseases of the blood and blood-forming organs and certain disorders involving the immune mechanism: Secondary | ICD-10-CM

## 2015-05-21 DIAGNOSIS — M069 Rheumatoid arthritis, unspecified: Secondary | ICD-10-CM

## 2015-05-21 LAB — COMPREHENSIVE METABOLIC PANEL
ALT: 20 U/L (ref 14–54)
AST: 20 U/L (ref 15–41)
Albumin: 3 g/dL — ABNORMAL LOW (ref 3.5–5.0)
Alkaline Phosphatase: 59 U/L (ref 38–126)
Anion gap: 6 (ref 5–15)
BUN: 18 mg/dL (ref 6–20)
CO2: 27 mmol/L (ref 22–32)
Calcium: 8.9 mg/dL (ref 8.9–10.3)
Chloride: 100 mmol/L — ABNORMAL LOW (ref 101–111)
Creatinine, Ser: 0.69 mg/dL (ref 0.44–1.00)
GFR calc Af Amer: >60 mL/min (ref >60)
GFR calc non Af Amer: >60 mL/min (ref >60)
Glucose, Bld: 267 mg/dL — ABNORMAL HIGH (ref 65–99)
Potassium: 3.7 mmol/L (ref 3.5–5.1)
Sodium: 133 mmol/L — ABNORMAL LOW (ref 135–145)
Total Bilirubin: 0.5 mg/dL (ref 0.3–1.2)
Total Protein: 6.9 g/dL (ref 6.5–8.1)

## 2015-05-21 LAB — CBC WITH DIFFERENTIAL/PLATELET
Basophils Absolute: 0 10*3/uL (ref 0–0.1)
Basophils Relative: 1 %
Eosinophils Absolute: 0.1 10*3/uL (ref 0–0.7)
Eosinophils Relative: 2 %
HCT: 31 % — ABNORMAL LOW (ref 35.0–47.0)
Hemoglobin: 10.4 g/dL — ABNORMAL LOW (ref 12.0–16.0)
Lymphocytes Relative: 16 %
Lymphs Abs: 0.7 10*3/uL — ABNORMAL LOW (ref 1.0–3.6)
MCH: 29.2 pg (ref 26.0–34.0)
MCHC: 33.7 g/dL (ref 32.0–36.0)
MCV: 86.7 fL (ref 80.0–100.0)
Monocytes Absolute: 0.7 10*3/uL (ref 0.2–0.9)
Monocytes Relative: 16 %
Neutro Abs: 3.1 10*3/uL (ref 1.4–6.5)
Neutrophils Relative %: 65 %
Platelets: 266 10*3/uL (ref 150–440)
RBC: 3.58 MIL/uL — ABNORMAL LOW (ref 3.80–5.20)
RDW: 20.6 % — ABNORMAL HIGH (ref 11.5–14.5)
WBC: 4.7 10*3/uL (ref 3.6–11.0)

## 2015-05-21 LAB — MAGNESIUM: Magnesium: 1.7 mg/dL (ref 1.7–2.4)

## 2015-05-21 NOTE — Progress Notes (Signed)
[facility] day:  05/21/2015   Chief Complaint: [name] is a 70 y.o. 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    HPI:  The patient was last seen in the medical oncology clinic on 05/15/2015.  At that time, she was seen for assessment prior to cycle #4 carboplatin and Taxol. Chemotherapy was cancelled secondary to a new decubitus ulcer.  She was referred to wound care.  She has not been seen (appointment on 05/25/2015).  Symptomatically, she voices no new concerns.  Her husband believes the decubitus ulcer is smaller and looks better.  She is sitting on a cushion with a hole in it to avoid ongoing pressure/trauma to this area.  She denies any fever.  Her neuropathy is stable.  Past Medical History  Diagnosis Date  . Leukopenia 2012    s/p bone marrow biopsy, Dr. Ma Hillock  . Cholelithiasis   . GERD (gastroesophageal reflux disease)   . Hypertension   . Hyperlipidemia   . Esophageal stricture   . Chronic diastolic CHF (congestive heart failure) ([place])   . CAD (coronary artery disease)     a. 09/2012 Cath: LM nl, LAD 95p, 46m LCX 972mOM2 50, RCA 100.  . Marland Kitchenypokalemia   . Herniated disc   . Pneumonia 2013; 08/2012    "one lung; double" (10/18/2012)  . Exertional shortness of breath   . Type I diabetes mellitus [place]    "dx'd in 1957" (10/18/2012)  . History of pancytopenia   . H/O hiatal hernia   . Rheumatoid arthritis(714.0)   . NSTEMI (non-ST elevated myocardial infarction) ([facility]    "mild" (10/18/2012)  . Collagen vascular disease ([facility]  [facility]    Past Surgical History  Procedure Laterality Date  . Esophagogastroduodenoscopy  2012    Dr. IfMinna Merritts. Tubal ligation  1970  . Cataract extraction w/ intraocular lens implant Right 2010  . Cardiac catheterization  10/18/2012    "first one was today"  (10/18/2012)  . Esophageal dilation      "3 or 4 times" (10/19/2011)  . Coronary artery bypass graft N/A 10/19/2012    Procedure: CORONARY ARTERY BYPASS GRAFTING (CABG);  Surgeon: StMelrose NakayamaMD;  [facility]: [facility]: [facility];  Laterality: N/A;  . Peripheral vascular catheterization N/A 03/02/2015    Procedure: Porta Cath Insertion;  Surgeon: JaAlgernon HuxleyMD;  [facility];  Service: Cardiovascular;  Laterality: N/A;    Family History  Problem Relation Age of Onset  . Diabetes Mother   . Arthritis Mother   . Diabetes Father   . Arthritis Father   . Bone cancer Sister     Social History:  reports that she quit smoking about 20 years ago. Her smoking use included Cigarettes. She has a 30 pack-year smoking history. She has never used smokeless tobacco. She reports that she does not drink alcohol or use illicit drugs.  The patient is accompanied by her husband today.  Allergies:  Allergies  Allergen Reactions  . Codeine     nausea    Current Medications: Current Outpatient Prescriptions  Medication Sig Dispense Refill  . amLODipine (NORVASC) 5 MG tablet TAKE 1 TABLET BY MOUTH ONCE A DAY 90 tablet 2  . aspirin 81 MG tablet Take 81 mg by mouth daily.    .Marland Kitchen  atorvastatin (LIPITOR) 80 MG tablet Take 1 tablet (80 mg total) by mouth daily. 90 tablet 3  . Cholecalciferol (VITAMIN D) 2000 UNITS CAPS Take 1 capsule by mouth daily.    Marland Kitchen dexamethasone (DECADRON) 4 MG tablet 20 mg (5 tablets) 12 hours and 6 hours prior to chemotherapy 30 tablet 1  . etanercept (ENBREL) 25 MG injection Inject 25 mg into the skin once a week. Reported on 1/61/0960    . folic acid (FOLVITE) 1 MG tablet Take 1 tablet (1 mg total) by mouth daily. 90 tablet 3  . furosemide (LASIX) 40 MG tablet Take 1 tablet (40 mg total) by mouth 2 (two) times daily. 180 tablet 3  . ibuprofen (ADVIL,MOTRIN) 800 MG tablet Take 1 tablet (800 mg total) by mouth 3 (three) times daily as needed. 270  tablet 3  . insulin lispro (HUMALOG) 100 UNIT/ML injection As directed via Insulin pump 30 mL 5  . lidocaine-prilocaine (EMLA) cream Apply 1 application topically as needed. Apply application 1 hour prior to treatment.  Cover with Press n seal until treatment time. 30 g 1  . loratadine (CLARITIN) 10 MG tablet Take 10 mg by mouth daily.    Marland Kitchen LORazepam (ATIVAN) 0.5 MG tablet Take 1 tablet (0.5 mg total) by mouth every 6 (six) hours as needed (nausea). 30 tablet 0  . losartan (COZAAR) 100 MG tablet Take 1 tablet (100 mg total) by mouth daily. 90 tablet 3  . magnesium oxide (MAG-OX) 400 MG tablet Take 400 mg by mouth daily. Reported on 05/19/2015    . methotrexate (RHEUMATREX) 2.5 MG tablet Take 4 tablets (10 mg total) by mouth once a week. Takes every Wednesday.Caution:Chemotherapy. Protect from light. 64 tablet 3  . metoprolol tartrate (LOPRESSOR) 25 MG tablet Take 1 tablet (25 mg total) by mouth 2 (two) times daily as needed. 180 tablet 3  . Multiple Vitamin (MULTIVITAMIN) tablet Take 1 tablet by mouth daily.      . ondansetron (ZOFRAN) 8 MG tablet Take 1 tablet (8 mg total) by mouth every 8 (eight) hours as needed for nausea or vomiting. 20 tablet 1  . pantoprazole (PROTONIX) 40 MG tablet Take 1 tablet (40 mg total) by mouth 2 (two) times daily. (Patient taking differently: Take 40 mg by mouth daily. ) 180 tablet 3  . potassium chloride SA (K-DUR,KLOR-CON) 10 MEQ tablet Take 1 tablet (10 mEq total) by mouth 2 (two) times daily. 20 tablet 0  . pregabalin (LYRICA) 75 MG capsule Take 75 mg by mouth 2 (two) times daily.    . ranitidine (ZANTAC) 150 MG tablet Take 1 tablet (150 mg total) by mouth 2 (two) times daily. 180 tablet 2  . saccharomyces boulardii (FLORASTOR) 250 MG capsule Take 250 mg by mouth as needed. Reported on 05/06/2015    . Wound Dressings (MEDIHONEY CA ALGINATE 2"X2") PADS Apply to sacral wound daily. 10 each 3  . zoledronic acid (RECLAST) 5 MG/100ML SOLN Inject 5 mg into the vein once.      No current facility-administered medications for this visit.   Facility-Administered Medications Ordered in Other Visits  Medication Dose Route Frequency Provider Last Rate Last Dose  . sodium chloride 0.9 % injection 10 mL  10 mL Intracatheter PRN Lequita Asal, MD      . sodium chloride 0.9 % injection 10 mL  10 mL Intravenous PRN Lequita Asal, MD   10 mL at 04/13/15 4540    Review of Systems:  GENERAL:  Doing ok.  No fevers or sweats.  Weight stable. PERFORMANCE STATUS (ECOG):  1 HEENT:  No visual changes, sore throat, mouth sores or tenderness. Lungs:  No shortness of breath.  No cough.  No hemoptysis. Cardiac:  No chest pain, palpitations, orthopnea, or PND. GI:  No nausea, vomiting, diarrhea, constipation, melena or hematochezia. GU:  No urgency, frequency, dysuria, or hematuria. Musculoskeletal:  Severe rheumatoid arthritis.  Osteoporosis.  No back pain. No muscle tenderness. Extremities:  No pain or swelling. Skin:  Decubitus ulcer.  No rashes or skin changes. Neuro:  No headache, numbness or weakness, balance or coordination issues. Endocrine:  Diabetes.  No thyroid issues, hot flashes or night sweats. Psych:  No mood changes, depression or anxiety. Pain:  No focal pain. Review of systems:  All other systems reviewed and found to be negative.  Physical Exam: Blood pressure 108/64, pulse 78, temperature 98.2 F (36.8 C), temperature source Tympanic, resp. rate 18, height 5' 3"  (1.6 m), weight 145 lb 4.5 oz (65.9 kg). GENERAL:  Well developed, well nourished, sitting comfortably in the exam room in no acute distress. MENTAL STATUS:  Alert and oriented to person, place and time. HEAD:  Wearing a blue wrap.  Normocephalic, atraumatic, face symmetric, no Cushingoid features. EYES:  Gold rimmed glasses.  Blue eyes.  No conjunctivitis or scleral icterus.  ENT: Oropharynx clear without lesion. Tongue normal. Mucous membranes moist.  RESPIRATORY: Clear to  auscultation without rales, wheezes or rhonchi. CARDIOVASCULAR: Regular rate and rhythm without murmur, rub or gallop. ABDOMEN: Soft, non-tender with active bowel sounds and no appreciable hepatosplenomegaly. No palpable nodularity or masses. RECTAL:  1.5 cm decubitus ulcer, stage II (deep), with clean borders and no evidence of infection, near right gluteal fold. SKIN: Decubitus ulcer as above.  No other rashes, ulcers or lesions. EXTREMITIES: No edema, skin discoloration or tenderness. No palpable cords. LYMPH NODES: No palpable cervical, supraclavicular, axillary or inguinal adenopathy  NEUROLOGICAL: Appropriate. PSYCH: Appropriate.  Appointment on 05/21/2015  Component Date Value Ref Range Status  . WBC 05/21/2015 4.7  3.6 - 11.0 K/uL Final  . RBC 05/21/2015 3.58* 3.80 - 5.20 MIL/uL Final  . Hemoglobin 05/21/2015 10.4* 12.0 - 16.0 g/dL Final  . HCT 05/21/2015 31.0* 35.0 - 47.0 % Final  . MCV 05/21/2015 86.7  80.0 - 100.0 fL Final  . MCH 05/21/2015 29.2  26.0 - 34.0 pg Final  . MCHC 05/21/2015 33.7  32.0 - 36.0 g/dL Final  . RDW 05/21/2015 20.6* 11.5 - 14.5 % Final  . Platelets 05/21/2015 266  150 - 440 K/uL Final  . Neutrophils Relative % 05/21/2015 65   Final  . Neutro Abs 05/21/2015 3.1  1.4 - 6.5 K/uL Final  . Lymphocytes Relative 05/21/2015 16   Final  . Lymphs Abs 05/21/2015 0.7* 1.0 - 3.6 K/uL Final  . Monocytes Relative 05/21/2015 16   Final  . Monocytes Absolute 05/21/2015 0.7  0.2 - 0.9 K/uL Final  . Eosinophils Relative 05/21/2015 2   Final  . Eosinophils Absolute 05/21/2015 0.1  0 - 0.7 K/uL Final  . Basophils Relative 05/21/2015 1   Final  . Basophils Absolute 05/21/2015 0.0  0 - 0.1 K/uL Final  . Sodium 05/21/2015 133* 135 - 145 mmol/L Final  . Potassium 05/21/2015 3.7  3.5 - 5.1 mmol/L Final  . Chloride 05/21/2015 100* 101 - 111 mmol/L Final  . CO2 05/21/2015 27  22 - 32 mmol/L Final  . Glucose, Bld 05/21/2015 267* 65 - 99 mg/dL Final  . BUN  05/21/2015 18   6 - 20 mg/dL Final  . Creatinine, Ser 05/21/2015 0.69  0.44 - 1.00 mg/dL Final  . Calcium 05/21/2015 8.9  8.9 - 10.3 mg/dL Final  . Total Protein 05/21/2015 6.9  6.5 - 8.1 g/dL Final  . Albumin 05/21/2015 3.0* 3.5 - 5.0 g/dL Final  . AST 05/21/2015 20  15 - 41 U/L Final  . ALT 05/21/2015 20  14 - 54 U/L Final  . Alkaline Phosphatase 05/21/2015 59  38 - 126 U/L Final  . Total Bilirubin 05/21/2015 0.5  0.3 - 1.2 mg/dL Final  . GFR calc non Af Amer 05/21/2015 >60  >60 mL/min Final  . GFR calc Af Amer 05/21/2015 >60  >60 mL/min Final   Comment: (NOTE) The eGFR has been calculated using the CKD EPI equation. This calculation has not been validated in all clinical situations. eGFR's persistently <60 mL/min signify possible Chronic Kidney Disease.   . Anion gap 05/21/2015 6  5 - 15 Final  . Magnesium 05/21/2015 1.7  1.7 - 2.4 mg/dL Final    Assessment:  [name] is a 70 y.o. female with clinical stage IIIC (T3cN1Mx) ovarian cancer presenting with abdominal discomfort and bloating.  Omental biopsy on 02/23/2015 revealed metastatic high grade serous carcinoma, consistent with gynecologic origin.  CA125 was 707 on 02/17/2015.  Abdomen and pelvic CT scan on 02/13/2015 revealed bilateral mass-like adnexal regions (right adnexal mass 5.6 x 5.0 cm and the left adnexa mass 10.3 x 6.0 x 9.9 cm).  There was a large amount of soft tissue throughout the peritoneal cavity involving the omentum and other peritoneal surfaces.  There was a small volume ascites. There was a peripheral 3.3 x 1.9 cm low-attenuation lesion overlying the right lobe of the liver , likely representing a serosal implant. There was 1.4 x 2.2 cm ill-defined peripheral lesion within the inferior aspect of segment 6 adjacent to the ampulla in the duodenum.   She has severe rheumatoid arthritis.  Methotrexate and Enbrel are on hold.  She has a normocytic anemia.  Work-up on 02/17/2015 revealed a normal ferritin, B12, folate, TSH.  She  denies any melena or hematochezia.  She has diabetes and is on an insulin pump.  She is s/p 3 cycles of neoadjuvant carboplatin and Taxol (03/05/2015 - 04/20/2015).  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  She had loose stools on oral magnesium.  She was initially on Neurontin then switched Lyrica with cycle #3.  CA125 was 802.9 on 03/30/2015, 567.9 on 04/13/2015, and 168.8 on 05/15/2015.  Abdominal and pelvic CT scan on 04/28/2015 revealed decreasing bilateral ovarian masses.   The left adnexal mass measured 4.0 x 6.1 cm (previously 5.0 x 8.5 cm). The right ovary measured 3.8 x 4.9 cm (previously 4.7 x 5.6 cm).  There was improved peritoneal carcinomatosis.  There was a small amount of ascites.  The hepatic dome lesion was stable. The previously seen right hepatic lobe lesion was not well-visualized.  There was a small left pleural effusion and trace right pleural effusion.  The nodular lesion at the ampulla of Vater, extending into the duodenum was stable.  There was no biliary ductal dilatation.  Symptomatically, she has a stable grade II neuropathy.  She has a 1.5 cm clean stage II right gluteal fold decubitus ulcer.  Sodium is slightly low.  Plan: 1.  Labs today:  CBC with diff, CMP, Mg. 2.  Patient to take Decadron today pre chemotherapy. 3.  [facility]. 4.  RTC tomorrow for cycle #4 carboplatin and Taxol. Maintain dose. 5.  RTC on 05/29/2015 for MD assess, labs (CBC with diff, BMP), and evaluation of decubitus ulcer.    Lequita Asal, MD  05/21/2015, 9:26 AM

## 2015-05-22 ENCOUNTER — Inpatient Hospital Stay: Payer: Medicare Other

## 2015-05-22 ENCOUNTER — Other Ambulatory Visit: Payer: Self-pay | Admitting: Hematology and Oncology

## 2015-05-22 DIAGNOSIS — C786 Secondary malignant neoplasm of retroperitoneum and peritoneum: Secondary | ICD-10-CM

## 2015-05-22 DIAGNOSIS — C569 Malignant neoplasm of unspecified ovary: Secondary | ICD-10-CM | POA: Diagnosis not present

## 2015-05-22 DIAGNOSIS — C801 Malignant (primary) neoplasm, unspecified: Principal | ICD-10-CM

## 2015-05-22 MED ORDER — SODIUM CHLORIDE 0.9 % IV SOLN
431.5000 mg | Freq: Once | INTRAVENOUS | Status: AC
  Administered 2015-05-22: 430 mg via INTRAVENOUS
  Filled 2015-05-22: qty 43

## 2015-05-22 MED ORDER — SODIUM CHLORIDE 0.9 % IV SOLN
175.0000 mg/m2 | Freq: Once | INTRAVENOUS | Status: AC
  Administered 2015-05-22: 318 mg via INTRAVENOUS
  Filled 2015-05-22: qty 53

## 2015-05-22 MED ORDER — SODIUM CHLORIDE 0.9 % IJ SOLN
10.0000 mL | INTRAMUSCULAR | Status: DC | PRN
  Administered 2015-05-22: 10 mL
  Filled 2015-05-22: qty 10

## 2015-05-22 MED ORDER — SODIUM CHLORIDE 0.9 % IV SOLN
Freq: Once | INTRAVENOUS | Status: AC
  Administered 2015-05-22: 09:00:00 via INTRAVENOUS
  Filled 2015-05-22: qty 1000

## 2015-05-22 MED ORDER — PACLITAXEL CHEMO INJECTION 300 MG/50ML: 175.0000 mg/m2 | Freq: Once | INTRAVENOUS | Status: DC

## 2015-05-22 MED ORDER — SODIUM CHLORIDE 0.9 % IV SOLN
Freq: Once | INTRAVENOUS | Status: AC
  Administered 2015-05-22: 10:00:00 via INTRAVENOUS
  Filled 2015-05-22: qty 8

## 2015-05-22 MED ORDER — FAMOTIDINE IN NACL 20-0.9 MG/50ML-% IV SOLN
20.0000 mg | Freq: Once | INTRAVENOUS | Status: AC
  Administered 2015-05-22: 20 mg via INTRAVENOUS
  Filled 2015-05-22: qty 50

## 2015-05-22 MED ORDER — DIPHENHYDRAMINE HCL 50 MG/ML IJ SOLN
50.0000 mg | Freq: Once | INTRAMUSCULAR | Status: AC
  Administered 2015-05-22: 50 mg via INTRAVENOUS
  Filled 2015-05-22: qty 1

## 2015-05-22 MED ORDER — HEPARIN SOD (PORK) LOCK FLUSH 100 UNIT/ML IV SOLN
500.0000 [IU] | Freq: Once | INTRAVENOUS | Status: AC | PRN
  Administered 2015-05-22: 500 [IU]
  Filled 2015-05-22: qty 5

## 2015-05-25 ENCOUNTER — Encounter: Payer: Medicare Other | Attending: Surgery | Admitting: Surgery

## 2015-05-25 DIAGNOSIS — M1990 Unspecified osteoarthritis, unspecified site: Secondary | ICD-10-CM | POA: Insufficient documentation

## 2015-05-25 DIAGNOSIS — H269 Unspecified cataract: Secondary | ICD-10-CM | POA: Diagnosis not present

## 2015-05-25 DIAGNOSIS — I5032 Chronic diastolic (congestive) heart failure: Secondary | ICD-10-CM | POA: Diagnosis not present

## 2015-05-25 DIAGNOSIS — G629 Polyneuropathy, unspecified: Secondary | ICD-10-CM | POA: Insufficient documentation

## 2015-05-25 DIAGNOSIS — Z951 Presence of aortocoronary bypass graft: Secondary | ICD-10-CM | POA: Diagnosis not present

## 2015-05-25 DIAGNOSIS — I10 Essential (primary) hypertension: Secondary | ICD-10-CM | POA: Diagnosis not present

## 2015-05-25 DIAGNOSIS — M069 Rheumatoid arthritis, unspecified: Secondary | ICD-10-CM | POA: Insufficient documentation

## 2015-05-25 DIAGNOSIS — L89159 Pressure ulcer of sacral region, unspecified stage: Secondary | ICD-10-CM | POA: Diagnosis not present

## 2015-05-25 DIAGNOSIS — Z794 Long term (current) use of insulin: Secondary | ICD-10-CM | POA: Diagnosis not present

## 2015-05-25 DIAGNOSIS — Z8543 Personal history of malignant neoplasm of ovary: Secondary | ICD-10-CM | POA: Insufficient documentation

## 2015-05-25 DIAGNOSIS — D72819 Decreased white blood cell count, unspecified: Secondary | ICD-10-CM | POA: Diagnosis not present

## 2015-05-25 DIAGNOSIS — K8020 Calculus of gallbladder without cholecystitis without obstruction: Secondary | ICD-10-CM | POA: Insufficient documentation

## 2015-05-25 DIAGNOSIS — E10610 Type 1 diabetes mellitus with diabetic neuropathic arthropathy: Secondary | ICD-10-CM | POA: Diagnosis present

## 2015-05-25 DIAGNOSIS — Z9889 Other specified postprocedural states: Secondary | ICD-10-CM | POA: Insufficient documentation

## 2015-05-25 DIAGNOSIS — L89313 Pressure ulcer of right buttock, stage 3: Secondary | ICD-10-CM | POA: Diagnosis not present

## 2015-05-25 DIAGNOSIS — Z9221 Personal history of antineoplastic chemotherapy: Secondary | ICD-10-CM | POA: Diagnosis not present

## 2015-05-25 DIAGNOSIS — Z87891 Personal history of nicotine dependence: Secondary | ICD-10-CM | POA: Insufficient documentation

## 2015-05-25 NOTE — Progress Notes (Addendum)
[name] ([id number]) Visit Report for 05/25/2015 Allergy List Details Patient Name: [name] Date of Service: 05/25/2015 1:30 PM Medical Record Number: [id number] Patient Account Number: [id number] Date of Birth/Sex: 1945-08-31 (69 y.o. Female) Treating RN: Baruch Gouty, RN, BSN, Velva Harman Primary Care Physician: Ronette Deter Other Clinician: Referring Physician: Ronette Deter Treating Physician/Extender: Frann Rider in Treatment: 0 Allergies Active Allergies codeine Reaction: sick Allergy Notes Electronic Signature(s) Signed: 05/25/2015 3:41:33 PM By: Regan Lemming BSN, RN Entered By: Regan Lemming on 05/25/2015 13:33:46 [name] ([id number]) -------------------------------------------------------------------------------- Arrival Information Details Patient Name: [name] Date of Service: 05/25/2015 1:30 PM Medical Record Number: [id number] Patient Account Number: [id number] Date of Birth/Sex: 1946-01-06 (70 y.o. Female) Treating RN: Baruch Gouty, RN, BSN, Velva Harman Primary Care Physician: Ronette Deter Other Clinician: Referring Physician: Ronette Deter Treating Physician/Extender: Frann Rider in Treatment: 0 Visit Information Patient Arrived: [name] Time: 13:30 Accompanied By: husband Transfer Assistance: None Patient Identification Verified: Yes Secondary Verification Process Yes Completed: Patient Requires Transmission-Based No Precautions: Patient Has Alerts: No Electronic Signature(s) Signed: 05/25/2015 3:41:33 PM By: Regan Lemming BSN, RN Entered By: Regan Lemming on 05/25/2015 13:30:24 [name] ([id number]) -------------------------------------------------------------------------------- Clinic Level of Care Assessment Details Patient Name: [name] Date of Service: 05/25/2015 1:30 PM Medical Record Number: [id number] Patient Account Number: [id number] Date of Birth/Sex: 04-Dec-1945 (69 y.o. Female) Treating RN:  Afful, RN, BSN, Velva Harman Primary Care Physician: Ronette Deter Other Clinician: Referring Physician: Ronette Deter Treating Physician/Extender: Frann Rider in Treatment: 0 Clinic Level of Care Assessment Items TOOL 1 Quantity Score []  - Use when EandM and Procedure is performed on INITIAL visit 0 ASSESSMENTS - Nursing Assessment / Reassessment X - General Physical Exam (combine w/ comprehensive assessment (listed just 1 20 below) when performed on new pt. evals) X - Comprehensive Assessment (HX, ROS, Risk Assessments, Wounds Hx, etc.) 1 25 ASSESSMENTS - Wound and Skin Assessment / Reassessment []  - Dermatologic / Skin Assessment (not related to wound area) 0 ASSESSMENTS - Ostomy and/or Continence Assessment and Care []  - Incontinence Assessment and Management 0 []  - Ostomy Care Assessment and Management (repouching, etc.) 0 PROCESS - Coordination of Care X - Simple Patient / Family Education for ongoing care 1 15 []  - Complex (extensive) Patient / Family Education for ongoing care 0 X - Staff obtains Programmer, systems, Records, Test Results / Process Orders 1 10 []  - Staff telephones HHA, Nursing Homes / Clarify orders / etc 0 []  - Routine Transfer to another Facility (non-emergent condition) 0 []  - Routine Hospital Admission (non-emergent condition) 0 X - New Admissions / Biomedical engineer / Ordering NPWT, Apligraf, etc. 1 15 []  - Emergency Hospital Admission (emergent condition) 0 PROCESS - Special Needs []  - Pediatric / Minor Patient Management 0 []  - Isolation Patient Management 0 [name], [name] ([id number]) []  - Hearing / Language / Visual special needs 0 []  - Assessment of Community assistance (transportation, D/C planning, etc.) 0 []  - Additional assistance / Altered mentation 0 []  - Support Surface(s) Assessment (bed, cushion, seat, etc.) 0 INTERVENTIONS - Miscellaneous []  - External ear exam 0 []  - Patient Transfer (multiple staff / Civil Service fast streamer / Similar  devices) 0 []  - Simple Staple / Suture removal (25 or less) 0 []  - Complex Staple / Suture removal (26 or more) 0 []  - Hypo/Hyperglycemic Management (do not check if billed separately) 0 []  - Ankle / Brachial Index (ABI) - do not check if billed separately 0 Has the patient been seen at  the hospital within the last three years: Yes Total Score: 85 Level Of Care: New/Established - Level 3 Electronic Signature(s) Signed: 05/25/2015 3:41:33 PM By: Regan Lemming BSN, RN Entered By: Regan Lemming on 05/25/2015 14:03:42 [name] ([id number]) -------------------------------------------------------------------------------- Encounter Discharge Information Details Patient Name: [name] Date of Service: 05/25/2015 1:30 PM Medical Record Number: [id number] Patient Account Number: [id number] Date of Birth/Sex: 1945-09-19 (69 y.o. Female) Treating RN: Baruch Gouty, RN, BSN, Velva Harman Primary Care Physician: Ronette Deter Other Clinician: Referring Physician: Ronette Deter Treating Physician/Extender: Frann Rider in Treatment: 0 Encounter Discharge Information Items Schedule Follow-up Appointment: No Medication Reconciliation completed No and provided to Patient/Care Man Effertz: Provided on Clinical Summary of Care: 05/25/2015 Form Type Recipient Paper Patient SR Electronic Signature(s) Signed: 05/25/2015 2:16:17 PM By: Ruthine Dose Entered By: Ruthine Dose on 05/25/2015 14:16:17 [name], [name] ([id number]) -------------------------------------------------------------------------------- Multi Wound Chart Details Patient Name: [name] Date of Service: 05/25/2015 1:30 PM Medical Record Number: [id number] Patient Account Number: [id number] Date of Birth/Sex: 01/20/1946 (70 y.o. Female) Treating RN: Baruch Gouty, RN, BSN, Velva Harman Primary Care Physician: Ronette Deter Other Clinician: Referring Physician: Ronette Deter Treating Physician/Extender: Frann Rider in  Treatment: 0 Vital Signs Height(in): 63 Pulse(bpm): 78 Weight(lbs): 145 Blood Pressure 133/55 (mmHg): Body Mass Index(BMI): 26 Temperature(F): 98.5 Respiratory Rate 16 (breaths/min): Photos: [1:No Photos] [N/A:N/A] Wound Location: [1:Right Sacrum] [N/A:N/A] Wounding Event: [1:Pressure Injury] [N/A:N/A] Primary Etiology: [1:Pressure Ulcer] [N/A:N/A] Comorbid History: [1:Cataracts, Type I Diabetes, Rheumatoid Arthritis, Osteoarthritis, Neuropathy, Received Chemotherapy] [N/A:N/A] Date Acquired: [1:05/25/2015] [N/A:N/A] Weeks of Treatment: [1:0] [N/A:N/A] Wound Status: [1:Open] [N/A:N/A] Measurements L x W x D 0.8x0.8x0.4 [N/A:N/A] (cm) Area (cm) : [1:0.503] [N/A:N/A] Volume (cm) : [1:0.201] [N/A:N/A] Classification: [1:Category/Stage III] [N/A:N/A] Exudate Amount: [1:Small] [N/A:N/A] Exudate Type: [1:Serosanguineous] [N/A:N/A] Exudate Color: [1:red, brown] [N/A:N/A] Wound Margin: [1:Flat and Intact] [N/A:N/A] Granulation Amount: [1:Medium (34-66%)] [N/A:N/A] Granulation Quality: [1:Pink] [N/A:N/A] Necrotic Amount: [1:Medium (34-66%)] [N/A:N/A] Exposed Structures: [1:Fascia: No Fat: No Tendon: No Muscle: No Joint: No Bone: No] [N/A:N/A] Limited to Skin Breakdown Epithelialization: None N/A N/A Periwound Skin Texture: Edema: No N/A N/A Excoriation: No Induration: No Callus: No Crepitus: No Fluctuance: No Friable: No Rash: No Scarring: No Periwound Skin Moist: Yes N/A N/A Moisture: Maceration: No Dry/Scaly: No Periwound Skin Color: Atrophie Blanche: No N/A N/A Cyanosis: No Ecchymosis: No Erythema: No Hemosiderin Staining: No Mottled: No Pallor: No Rubor: No Temperature: No Abnormality N/A N/A Tenderness on Yes N/A N/A Palpation: Wound Preparation: Ulcer Cleansing: N/A N/A Rinsed/Irrigated with Saline Topical Anesthetic Applied: Other: lidocaine 4% Treatment Notes Electronic Signature(s) Signed: 05/25/2015 3:41:33 PM By: Regan Lemming BSN, RN Entered  By: Regan Lemming on 05/25/2015 13:56:46 [name] ([id number]) -------------------------------------------------------------------------------- Multi-Disciplinary Care Plan Details Patient Name: [name] Date of Service: 05/25/2015 1:30 PM Medical Record Number: [id number] Patient Account Number: [id number] Date of Birth/Sex: [birth year] (69 y.o. Female) Treating RN: Afful, RN, BSN, Velva Harman Primary Care Physician: Ronette Deter Other Clinician: Referring Physician: Ronette Deter Treating Physician/Extender: Frann Rider in Treatment: 0 Active Inactive Orientation to the Wound Care Program Nursing Diagnoses: Knowledge deficit related to the wound healing center program Goals: Patient/caregiver will verbalize understanding of the [facility] Program Date Initiated: 05/25/2015 Goal Status: Active Interventions: Provide education on orientation to the wound center Notes: Pressure Nursing Diagnoses: Knowledge deficit related to causes and risk factors for pressure ulcer development Knowledge deficit related to management of pressures ulcers Potential for impaired tissue integrity related to pressure, friction, moisture, and shear Goals: Patient will remain free from  development of additional pressure ulcers Date Initiated: 05/25/2015 Goal Status: Active Patient will remain free of pressure ulcers Date Initiated: 05/25/2015 Goal Status: Active Patient/caregiver will verbalize risk factors for pressure ulcer development Date Initiated: 05/25/2015 Goal Status: Active Patient/caregiver will verbalize understanding of pressure ulcer management Date Initiated: 05/25/2015 Goal Status: Active Interventions: [name], [name] ([id number]) Assess: immobility, friction, shearing, incontinence upon admission and as needed Assess offloading mechanisms upon admission and as needed Assess potential for pressure ulcer upon admission and as needed Provide education  on pressure ulcers Treatment Activities: Patient referred for home evaluation of offloading devices/mattresses : 05/25/2015 Patient referred for seating evaluation to ensure proper offloading : 05/25/2015 Pressure reduction/relief device ordered : 05/25/2015 Notes: Wound/Skin Impairment Nursing Diagnoses: Impaired tissue integrity Knowledge deficit related to ulceration/compromised skin integrity Goals: Patient/caregiver will verbalize understanding of skin care regimen Date Initiated: 05/25/2015 Goal Status: Active Ulcer/skin breakdown will have a volume reduction of 30% by week 4 Date Initiated: 05/25/2015 Goal Status: Active Ulcer/skin breakdown will have a volume reduction of 50% by week 8 Date Initiated: 05/25/2015 Goal Status: Active Ulcer/skin breakdown will have a volume reduction of 80% by week 12 Date Initiated: 05/25/2015 Goal Status: Active Ulcer/skin breakdown will heal within 14 weeks Date Initiated: 05/25/2015 Goal Status: Active Interventions: Assess patient/caregiver ability to obtain necessary supplies Assess patient/caregiver ability to perform ulcer/skin care regimen upon admission and as needed Assess ulceration(s) every visit Provide education on ulcer and skin care Treatment Activities: Patient referred to home care : 05/25/2015 Referred to DME Maksymilian Mabey for dressing supplies : 05/25/2015 Topical wound management initiated : 05/25/2015 [name], [name] ([id number]) Notes: Electronic Signature(s) Signed: 05/25/2015 3:41:33 PM By: Regan Lemming BSN, RN Entered By: Regan Lemming on 05/25/2015 13:56:17 [name] ([id number]) -------------------------------------------------------------------------------- Pain Assessment Details Patient Name: [name] Date of Service: 05/25/2015 1:30 PM Medical Record Number: [id number] Patient Account Number: [id number] Date of Birth/Sex: 1945/08/14 (70 y.o. Female) Treating RN: Baruch Gouty, RN, BSN, Velva Harman Primary Care  Physician: Ronette Deter Other Clinician: Referring Physician: Ronette Deter Treating Physician/Extender: Frann Rider in Treatment: 0 Active Problems Location of Pain Severity and Description of Pain Patient Has Paino No Site Locations Pain Management and Medication Current Pain Management: Electronic Signature(s) Signed: 05/25/2015 3:41:33 PM By: Regan Lemming BSN, RN Entered By: Regan Lemming on 05/25/2015 13:34:01 [name] ([id number]) -------------------------------------------------------------------------------- Patient/Caregiver Education Details Patient Name: [name] Date of Service: 05/25/2015 1:30 PM Medical Record Number: [id number] Patient Account Number: [id number] Date of Birth/Gender: [birth year] (69 y.o. Female) Treating RN: Baruch Gouty, RN, BSN, Velva Harman Primary Care Physician: Ronette Deter Other Clinician: Referring Physician: Ronette Deter Treating Physician/Extender: Frann Rider in Treatment: 0 Education Assessment Education Provided To: Patient and Caregiver Education Topics Provided Pressure: Methods: Explain/Verbal Responses: State content correctly Welcome To [facility]: Methods: Explain/Verbal Responses: State content correctly Wound/Skin Impairment: Methods: Explain/Verbal Responses: State content correctly Electronic Signature(s) Signed: 05/25/2015 3:41:33 PM By: Regan Lemming BSN, RN Entered By: Regan Lemming on 05/25/2015 14:16:44 [name] ([id number]) -------------------------------------------------------------------------------- Wound Assessment Details Patient Name: [name] Date of Service: 05/25/2015 1:30 PM Medical Record Number: [id number] Patient Account Number: [id number] Date of Birth/Sex: 1945/06/25 (70 y.o. Female) Treating RN: Baruch Gouty, RN, BSN, Velva Harman Primary Care Physician: Ronette Deter Other Clinician: Referring Physician: Ronette Deter Treating Physician/Extender:  Frann Rider in Treatment: 0 Wound Status Wound Number: 1 Primary Pressure Ulcer Etiology: Wound Location: Right Gluteus Wound Open Wounding Event: Pressure Injury Status: Date Acquired: 05/25/2015 Comorbid Cataracts, Type I  Diabetes, Rheumatoid Weeks Of Treatment: 0 History: Arthritis, Osteoarthritis, Neuropathy, Clustered Wound: No Received Chemotherapy Photos Wound Measurements Length: (cm) 1 Width: (cm) 1 Depth: (cm) 0.4 Area: (cm) 0.785 Volume: (cm) 0.314 % Reduction in Area: 0% % Reduction in Volume: 0% Epithelialization: None Tunneling: No Undermining: No Wound Description Classification: Category/Stage III Wound Margin: Flat and Intact Exudate Amount: Medium Exudate Type: Serosanguineous Exudate Color: red, brown Foul Odor After Cleansing: No Wound Bed Granulation Amount: Medium (34-66%) Exposed Structure Granulation Quality: Pink Fascia Exposed: No Necrotic Amount: Medium (34-66%) Fat Layer Exposed: No Necrotic Quality: Adherent Slough Tendon Exposed: No Muscle Exposed: No [name], [name] ([id number]) Joint Exposed: No Bone Exposed: No Limited to Skin Breakdown Periwound Skin Texture Texture Color No Abnormalities Noted: No No Abnormalities Noted: No Callus: No Atrophie Blanche: No Crepitus: No Cyanosis: No Excoriation: No Ecchymosis: No Fluctuance: No Erythema: No Friable: No Hemosiderin Staining: No Induration: No Mottled: No Localized Edema: No Pallor: No Rash: No Rubor: No Scarring: No Temperature / Pain Moisture Temperature: No Abnormality No Abnormalities Noted: No Tenderness on Palpation: Yes Dry / Scaly: No Maceration: No Moist: Yes Wound Preparation Ulcer Cleansing: Rinsed/Irrigated with Saline Topical Anesthetic Applied: Other: lidocaine 4%, Treatment Notes Wound #1 (Right Gluteus) 1. Cleansed with: Clean wound with Normal Saline 4. Dressing Applied: Aquacel Ag 5. Secondary Bath Signature(s) Signed: 05/26/2015 11:24:02 AM By: Regan Lemming BSN, RN Previous Signature: 05/25/2015 3:41:33 PM Version By: Regan Lemming BSN, RN Entered By: Regan Lemming on 05/26/2015 11:24:01 [name] ([id number]) -------------------------------------------------------------------------------- Vitals Details Patient Name: [name] Date of Service: 05/25/2015 1:30 PM Medical Record Number: [id number] Patient Account Number: [id number] Date of Birth/Sex: [birth year] (70 y.o. Female) Treating RN: Afful, RN, BSN, [place] Primary Care Physician: Ronette Deter Other Clinician: Referring Physician: Ronette Deter Treating Physician/Extender: Frann Rider in Treatment: 0 Vital Signs Time Taken: 13:34 Temperature (F): 98.5 Height (in): 63 Pulse (bpm): 78 Source: Stated Respiratory Rate (breaths/min): 16 Weight (lbs): 145 Blood Pressure (mmHg): 133/55 Source: Stated Reference Range: 80 - 120 mg / dl Body Mass Index (BMI): 25.7 Electronic Signature(s) Signed: 05/25/2015 3:41:33 PM By: Regan Lemming BSN, RN Entered By: Regan Lemming on 05/25/2015 13:34:54

## 2015-05-25 NOTE — Progress Notes (Addendum)
CIRA, MILWARD ([id number]) Visit Report for 05/25/2015 Chief Complaint Document Details Patient Name: [name] Date of Service: 05/25/2015 1:30 PM Medical Record Patient Account Number: [id number] Number: [name], Treating [name]: 1945-12-27 [phone] y.o. Velva Harman Date of Birth/Sex: Female) Other Clinician: Primary Care Physician: [name] Treating [name] Referring Physician: Ronette [name] Physician/Extender: [name] in Treatment: 0 Information Obtained from: Patient Chief Complaint Patient is at the clinic for treatment of an open pressure ulcer to the left gluteal region for about 3 weeks now. Electronic Signature(s) Signed: 05/25/2015 2:07:06 PM By: Janet Fudge MD, FACS Entered By: [name] on 05/25/2015 14:07:05 [name] ([id number]) -------------------------------------------------------------------------------- Debridement Details Patient Name: [name] Date of Service: 05/25/2015 1:30 PM Medical Record Patient Account Number: [id number] Number: [name], Treating [name]: 1946/05/01 [id number] y.o. Velva Harman Date of Birth/Sex: Female) Other Clinician: Primary Care Physician: [name] Treating [name] Referring Physician: Ronette [name] Physician/Extender: [name] in Treatment: 0 Debridement Performed for Wound #1 Right Sacrum Assessment: Performed By: Physician Janet Fudge, MD Debridement: Debridement Pre-procedure Yes Verification/Time Out Taken: Start Time: 13:55 Pain Control: Lidocaine 4% Topical Solution Level: Skin/Subcutaneous Tissue Total Area Debrided (L x 0.8 (cm) x 0.8 (cm) = 0.64 (cm) W): Tissue and other Viable, Non-Viable, Eschar, Exudate, Fibrin/Slough, Subcutaneous material debrided: Instrument: Forceps, Scissors Bleeding: Minimum Hemostasis Achieved: Pressure End Time: 14:00 Procedural Pain: 3 Post Procedural Pain: 3 Response to Treatment: Procedure was tolerated  well Post Debridement Measurements of Total Wound Length: (cm) 0.8 Stage: Category/Stage III Width: (cm) 0.8 Depth: (cm) 0.4 Volume: (cm) 0.201 Post Procedure Diagnosis Same as Pre-procedure Electronic Signature(s) Signed: 05/25/2015 2:06:42 PM By: Janet Fudge MD, FACS Signed: 05/25/2015 3:41:33 PM By: [name] Entered By: [name] on 05/25/2015 14:06:41 [name] ([id number]) -------------------------------------------------------------------------------- HPI Details Patient Name: [name] Date of Service: 05/25/2015 1:30 PM Medical Record Patient Account Number: [id number] Number: [name], Treating [name]: 1945-09-25 (70 y.o. Velva Harman Date of Birth/Sex: Female) Other Clinician: Primary Care Physician: [name] Treating [name] Referring Physician: Ronette [name] Physician/Extender: [name] in Treatment: 0 History of Present Illness Location: ulcerated on the right gluteal region just to the right of the midline Quality: Patient reports experiencing a dull pain to affected area(s). Severity: Patient states wound are getting worse. Duration: Patient has had the wound for < 3 weeks prior to presenting for treatment Timing: Pain in wound is Intermittent (comes and goes Context: The wound appeared gradually over time Modifying Factors: Consults to this date include: local care was done as per the medication given by the PCP which may have been medihoney Associated Signs and Symptoms: Patient reports having difficulty standing for long periods. 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 Susy Manor. he was referred to [place] for a decubitus ulcer on her right gluteal area. she is known to have diabetes mellitus type 1 and her most recent A1c was 7.7% Past medical history significant for leukopenia, cholelithiasis,  hypertension, chronic diastolic CHF, coronary artery disease,type 1 diabetes mellitus, rheumatoid arthritis, collagen vascular disease, ovarian cancer, status post CABG in 2014 and status post Port-A-Cath insertion by Dr. Leotis Pain in October 2016. She has quit smoking about 20 years ago. She was advised to use Medihoney with calcium alginate pads to be applied over the wound. Electronic Signature(s) Signed: 05/25/2015 2:10:46 PM By: Janet Fudge MD, FACS Previous Signature: 05/25/2015 [id number]  PM Version By: Janet Fudge MD, FACS Previous Signature: 05/25/2015 1:43:13 PM Version By: Janet Fudge MD, FACS Entered By: [name] on 05/25/2015 14:10:46 [name] ([id number]) -------------------------------------------------------------------------------- Physical Exam Details Patient Name: [name] Date of Service: 05/25/2015 1:30 PM Medical Record Patient Account Number: [id number] Number: [name], Treating [name]: 1945/09/05 [phone] y.o. Velva Harman Date of Birth/Sex: Female) Other Clinician: Primary Care Physician: [name] Treating [name] Referring Physician: Ronette [name] Physician/Extender: [name] in Treatment: 0 Constitutional . Pulse regular. Respirations normal and unlabored. Afebrile. . Eyes Nonicteric. Reactive to light. Ears, Nose, Mouth, and Throat Lips, teeth, and gums WNL.Marland Kitchen Moist mucosa without lesions. Neck supple and nontender. No palpable supraclavicular or cervical adenopathy. Normal sized without goiter. Respiratory WNL. No retractions.. Cardiovascular Pedal Pulses WNL. No clubbing, cyanosis or edema. Gastrointestinal (GI) Abdomen without masses or tenderness.. No liver or spleen enlargement or tenderness.. Lymphatic No adneopathy. No adenopathy. No adenopathy. Musculoskeletal Adexa without tenderness or enlargement.. Digits and nails w/o clubbing, cyanosis, infection, petechiae, ischemia, or inflammatory  conditions.. Integumentary (Hair, Skin) No suspicious lesions. No crepitus or fluctuance. No peri-wound warmth or erythema. No masses.Marland Kitchen Psychiatric Judgement and insight Intact.. No evidence of depression, anxiety, or agitation.. Notes Ulcerated area on the right gluteal region towards the sacrum and to the right of the midline and fairly deep with a slough covered base which has debris which need sharp dissection with a forcep and scissors. Electronic Signature(s) Signed: 05/25/2015 2:11:51 PM By: Janet Fudge MD, FACS Entered By: [name] on 05/25/2015 14:11:50 [name] ([id number]) -------------------------------------------------------------------------------- Physician Orders Details Patient Name: [name] Date of Service: 05/25/2015 1:30 PM Medical Record Patient Account Number: [id number] Number: [name], Treating [name]: 11-14-1945 [phone] y.o. Velva Harman Date of Birth/Sex: Female) Other Clinician: Primary Care Physician: [name] Treating [name] Referring Physician: Ronette [name] Physician/Extender: [name] in Treatment: 0 Verbal / Phone Orders: Yes Clinician: Afful, [name], [name], Rita Read Back and Verified: Yes Diagnosis Coding Wound Cleansing Wound #1 Right Gluteus o Cleanse wound with mild soap and water o May Shower, gently pat wound dry prior to applying new dressing. Primary Wound Dressing Wound #1 Right Gluteus o Aquacel Ag Secondary Dressing Wound #1 Right Gluteus o Non-adherent pad Dressing Change Frequency Wound #1 Right Gluteus o Change dressing every other day. Follow-up Appointments Wound #1 Right Gluteus o Return Appointment in 1 week. Off-Loading Wound #1 Right Gluteus o Turn and reposition every 2 hours o Mattress Additional Orders / Instructions Wound #1 Right Gluteus o Increase protein intake. o Activity as tolerated o Other: - Include Zinc, MVI and Vit C daily. [name], [name] ([id number]) Electronic Signature(s) Signed: 05/25/2015 3:41:33 PM By: [name] Signed: 05/25/2015 4:13:39 PM By: Janet Fudge MD, FACS Entered By: [name] on 05/25/2015 14:15:54 [name] ([id number]) -------------------------------------------------------------------------------- Problem List Details Patient Name: [name] Date of Service: 05/25/2015 1:30 PM Medical Record Patient Account Number: [id number] Number: [name], Treating [name]: 12-08-1945 (70 y.o. Velva Harman Date of Birth/Sex: Female) Other Clinician: Primary Care Physician: [name] Treating [name] Referring Physician: Ronette [name] Physician/Extender: [name] in Treatment: 0 Active Problems ICD-10 Encounter Code Description Active Date Diagnosis E10.622 Type 1 diabetes mellitus with other skin ulcer 05/25/2015 Yes L89.313 Pressure ulcer of right buttock, stage 3 05/25/2015 Yes Z92.21 Personal history of antineoplastic chemotherapy 05/25/2015 Yes Inactive Problems Resolved Problems Electronic Signature(s) Signed: 05/25/2015 2:09:28 PM By: Janet Fudge MD, FACS Previous Signature: 05/25/2015 2:06:28 PM  Version By: Janet Fudge MD, FACS Entered By: [name] on 05/25/2015 14:09:28 [name] ([id number]) -------------------------------------------------------------------------------- Progress Note Details Patient Name: [name] Date of Service: 05/25/2015 1:30 PM Medical Record Patient Account Number: [id number] Number: [name], Treating [name]: 1945-06-27 [phone] y.o. Velva Harman Date of Birth/Sex: Female) Other Clinician: Primary Care Physician: [name] Treating [name] Referring Physician: Ronette [name] Physician/Extender: [name] in Treatment: 0 Subjective Chief Complaint Information obtained from Patient Patient is at the clinic for treatment of an open pressure ulcer to the left gluteal region for about 3  weeks now. History of Present Illness (HPI) The following HPI elements were documented for the patient's wound: Location: ulcerated on the right gluteal region just to the right of the midline Quality: Patient reports experiencing a dull pain to affected area(s). Severity: Patient states wound are getting worse. Duration: Patient has had the wound for < 3 weeks prior to presenting for treatment Timing: Pain in wound is Intermittent (comes and goes Context: The wound appeared gradually over time Modifying Factors: Consults to this date include: local care was done as per the medication given by the PCP which may have been medihoney Associated Signs and Symptoms: Patient reports having difficulty standing for long periods. 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 Susy Manor. he was referred to [place] for a decubitus ulcer on her right gluteal area. she is known to have diabetes mellitus type 1 and her most recent A1c was 7.7% Past medical history significant for leukopenia, cholelithiasis, hypertension, chronic diastolic CHF, coronary artery disease,type 1 diabetes mellitus, rheumatoid arthritis, collagen vascular disease, ovarian cancer, status post CABG in 2014 and status post Port-A-Cath insertion by Dr. Leotis Pain in October 2016. She has quit smoking about 20 years ago. She was advised to use Medihoney with calcium alginate pads to be applied over the wound. Wound History Patient presents with 1 open wound that has been present for approximately 3 weeks. Patient has been treating wound in the following manner: duoderm. Laboratory tests have not been performed in the last month. Patient reportedly has not tested positive for an antibiotic resistant organism. Patient reportedly has not tested positive for osteomyelitis. Patient reportedly has not had testing performed to evaluate circulation in  the legs. Patient History [name], [name] ([id number]) Information obtained from Patient. Allergies codeine (Reaction: sick) Family History Cancer - Siblings, Diabetes - Mother, Siblings, Heart Disease - Father, Mother, Siblings, Hypertension - Siblings, Father, Mother, No family history of Hereditary Spherocytosis, Kidney Disease, Lung Disease, Seizures, Stroke, Thyroid Problems, Tuberculosis. Social History Former smoker, Marital Status - Married, Alcohol Use - Never, Drug Use - No History, Caffeine Use - Never. Medical History Eyes Patient has history of Cataracts - both eyes Denies history of Glaucoma, Optic Neuritis Hematologic/Lymphatic Denies history of Anemia, Hemophilia, Human Immunodeficiency Virus, Lymphedema, Sickle Cell Disease Respiratory Denies history of Aspiration, Asthma, Chronic Obstructive Pulmonary Disease (COPD), Pneumothorax, Sleep Apnea, Tuberculosis Gastrointestinal Denies history of Cirrhosis , Colitis, Crohn s, Hepatitis A, Hepatitis B, Hepatitis C Endocrine Patient has history of Type I Diabetes - over 34 years Genitourinary Denies history of End Stage Renal Disease Immunological Denies history of Lupus Erythematosus, Raynaud s, Scleroderma Integumentary (Skin) Denies history of History of Burn, History of pressure wounds Musculoskeletal Patient has history of Rheumatoid Arthritis, Osteoarthritis Neurologic Patient has history of Neuropathy Denies history of Dementia Oncologic Patient has history of Received Chemotherapy Patient is treated  with Insulin. Blood sugar is tested. Blood sugar results noted at the following times: Lunch - 263. Medical And Surgical History Notes Cardiovascular quadruple bypass 3 years ago Review of Systems (ROS) Constitutional Symptoms (General Health) [name], [name]. ([id number]) The patient has no complaints or symptoms. Eyes Complains or has symptoms of Glasses / Contacts. Ear/Nose/Mouth/Throat The patient  has no complaints or symptoms, hard of hearing Hematologic/Lymphatic Complains or has symptoms of Bleeding / Clotting Disorders. Respiratory The patient has no complaints or symptoms. Cardiovascular The patient has no complaints or symptoms. Gastrointestinal The patient has no complaints or symptoms. Endocrine The patient has no complaints or symptoms. Genitourinary The patient has no complaints or symptoms. Immunological The patient has no complaints or symptoms. Integumentary (Skin) Complains or has symptoms of Wounds. Musculoskeletal The patient has no complaints or symptoms. Neurologic The patient has no complaints or symptoms. Oncologic The patient has no complaints or symptoms, current treatment for Chemo once every 3 weeks. She has had 4 chemo. After surgery she will have 3 more due for surgery in 4 weeks for hysterectomy Psychiatric The patient has no complaints or symptoms. List of medications are reviewed and consist of the following: Norvasc 5 mg daily, aspirin 81 mg daily, Lipitor 80 mg daily, Decadron 4 mg before and after her chemotherapy, folic acid 1 mg daily, Lasix 40 mg twice daily, ibuprofen 800 mg 3 times a day as needed, insulin, lorazepam, magnesium oxide, methotrexate, Lopressor, multivitamin, Protonix, vitamin D. Objective Constitutional [name], [name]. ([id number]) Pulse regular. Respirations normal and unlabored. Afebrile. Vitals Time Taken: 1:34 PM, Height: 63 in, Source: Stated, Weight: 145 lbs, Source: Stated, BMI: 25.7, Temperature: 98.5 F, Pulse: 78 bpm, Respiratory Rate: 16 breaths/min, Blood Pressure: 133/55 mmHg. Eyes Nonicteric. Reactive to light. Ears, Nose, Mouth, and Throat Lips, teeth, and gums WNL.Marland Kitchen Moist mucosa without lesions. Neck supple and nontender. No palpable supraclavicular or cervical adenopathy. Normal sized without goiter. Respiratory WNL. No retractions.. Cardiovascular Pedal Pulses WNL. No clubbing, cyanosis or  edema. Gastrointestinal (GI) Abdomen without masses or tenderness.. No liver or spleen enlargement or tenderness.. Lymphatic No adneopathy. No adenopathy. No adenopathy. Musculoskeletal Adexa without tenderness or enlargement.. Digits and nails w/o clubbing, cyanosis, infection, petechiae, ischemia, or inflammatory conditions.Marland Kitchen Psychiatric Judgement and insight Intact.. No evidence of depression, anxiety, or agitation.. General Notes: Ulcerated area on the right gluteal region towards the sacrum and to the right of the midline and fairly deep with a slough covered base which has debris which need sharp dissection with a forcep and scissors. Integumentary (Hair, Skin) No suspicious lesions. No crepitus or fluctuance. No peri-wound warmth or erythema. No masses.. Wound #1 status is Open. Original cause of wound was Pressure Injury. The wound is located on the Right Gluteus. The wound measures 1cm length x 1cm width x 0.4cm depth; 0.785cm^2 area and 0.314cm^3 volume. The wound is limited to skin breakdown. There is no tunneling or undermining noted. There is a medium amount of serosanguineous drainage noted. The wound margin is flat and intact. There is medium (34-66%) pink granulation within the wound bed. There is a medium (34-66%) amount of necrotic tissue within the wound bed including Adherent Slough. The periwound skin appearance exhibited: Moist. The periwound skin appearance did not exhibit: Callus, Crepitus, Excoriation, Fluctuance, Friable, Induration, Localized Edema, Rash, Scarring, Dry/Scaly, Maceration, Atrophie Blanche, Cyanosis, Ecchymosis, Hemosiderin Staining, Mottled, Pallor, Rubor, Erythema. Periwound temperature was noted as No AYLEIGH, CUADRA. ([id number]) Abnormality. The periwound has tenderness on palpation. Assessment Active Problems ICD-10 E10.622 -  Type 1 diabetes mellitus with other skin ulcer L89.313 - Pressure ulcer of right buttock, stage 3 Z92.21 -  Personal history of antineoplastic chemotherapy This 70 year old type I diabetic patient who has well-controlled glucose is now on antineoplastic therapy for ovarian tumor. Due to generalized weakness she developed a stage III pressure injury on the right gluteal area just to the right of the midline near the sacrum. After debriding her wound today I have recommended: 1. Offloading as much as possible and have discussed details of this. 2. Packing the wound with Aquacel Ag and applying inappropriate bordered foam dressing. 3. Appropriate air cushion for her chair as required. 4. vitamin supplements including vitamin C and zinc 5. adequate protein intake 6. Regular visits to the wound care center The patient and her husband have had all questions answered and will be seeing me next week. Procedures Wound #1 Wound #1 is a Pressure Ulcer located on the Right Sacrum . There was a Skin/Subcutaneous Tissue Debridement [id number]) debridement with total area of 0.64 sq cm performed by Janet Fudge, MD. with the following instrument(s): Forceps and Scissors to remove Viable and Non-Viable tissue/material including Exudate, Fibrin/Slough, Eschar, and Subcutaneous after achieving pain control using Lidocaine 4% Topical Solution. A time out was conducted prior to the start of the procedure. A Minimum amount of bleeding was controlled with Pressure. The procedure was tolerated well with a pain level of 3 throughout and a pain level of 3 following the procedure. Post Debridement Measurements: 0.8cm length x 0.8cm width x 0.4cm depth; 0.201cm^3 volume. Post debridement Stage noted as Category/Stage III. Post procedure Diagnosis Wound #1: Same as Pre-Procedure [name], [name]. ([id number]) Plan Wound Cleansing: Wound #1 Right Gluteus: Cleanse wound with mild soap and water May Shower, gently pat wound dry prior to applying new dressing. Primary Wound Dressing: Wound #1 Right Gluteus: Aquacel  Ag Secondary Dressing: Wound #1 Right Gluteus: Non-adherent pad Dressing Change Frequency: Wound #1 Right Gluteus: Change dressing every other day. Follow-up Appointments: Wound #1 Right Gluteus: Return Appointment in 1 week. Off-Loading: Wound #1 Right Gluteus: Turn and reposition every 2 hours Mattress Additional Orders / Instructions: Wound #1 Right Gluteus: Increase protein intake. Activity as tolerated Other: - Include Zinc, MVI and Vit C daily. This 70 year old type I diabetic patient who has well-controlled glucose is now on antineoplastic therapy for ovarian tumor. Due to generalized weakness she developed a stage III pressure injury on the right gluteal area just to the right of the midline near the sacrum. After debriding her wound today I have recommended: 1. Offloading as much as possible and have discussed details of this. 2. Packing the wound with Aquacel Ag and applying inappropriate bordered foam dressing. [name], [name]. ([id number]) 3. Appropriate air cushion for her chair as required. 4. vitamin supplements including vitamin C and zinc 5. adequate protein intake 6. Regular visits to the wound care center The patient and her husband have had all questions answered and will be seeing me next week. Electronic Signature(s) Signed: 05/26/2015 2:37:37 PM By: Janet Fudge MD, FACS Previous Signature: 05/25/2015 2:41:19 PM Version By: Janet Fudge MD, FACS Previous Signature: 05/25/2015 2:12:55 PM Version By: Janet Fudge MD, FACS Entered By: [name] on 05/26/2015 14:37:37 [name] ([id number]) -------------------------------------------------------------------------------- ROS/PFSH Details Patient Name: [name] Date of Service: 05/25/2015 1:30 PM Medical Record Patient Account Number: [id number] Number: [name], Treating [name]: Dec 09, 1945 (70 y.o. Velva Harman Date of Birth/Sex: Female) Other Clinician: Primary Care Physician:  [name]  Treating Zariya Minner Referring Physician: Ronette [name] Physician/Extender: [name] in Treatment: 0 Information Obtained From Patient Wound History Do you currently have one or more open woundso Yes How many open wounds do you currently haveo 1 Approximately how long have you had your woundso 3 weeks How have you been treating your wound(s) until nowo duoderm Has your wound(s) ever healed and then re-openedo No Have you had any lab work done in the past montho No Have you tested positive for an antibiotic resistant organism (MRSA, VRE)o No Have you tested positive for osteomyelitis (bone infection)o No Have you had any tests for circulation on your legso No Eyes Complaints and Symptoms: Positive for: Glasses / Contacts Medical History: Positive for: Cataracts - both eyes Negative for: Glaucoma; Optic Neuritis Hematologic/Lymphatic Complaints and Symptoms: Positive for: Bleeding / Clotting Disorders Medical History: Negative for: Anemia; Hemophilia; Human Immunodeficiency Virus; Lymphedema; Sickle Cell Disease Integumentary (Skin) Complaints and Symptoms: Positive for: Wounds Medical History: Negative for: History of Burn; History of pressure wounds Constitutional Symptoms (General Health) [name], [name] ([id number]) Complaints and Symptoms: No Complaints or Symptoms Ear/Nose/Mouth/Throat Complaints and Symptoms: No Complaints or Symptoms Complaints and Symptoms: Review of System Notes: hard of hearing Respiratory Complaints and Symptoms: No Complaints or Symptoms Medical History: Negative for: Aspiration; Asthma; Chronic Obstructive Pulmonary Disease (COPD); Pneumothorax; Sleep Apnea; Tuberculosis Cardiovascular Complaints and Symptoms: No Complaints or Symptoms Medical History: Past Medical History Notes: quadruple bypass 3 years ago Gastrointestinal Complaints and Symptoms: No Complaints or Symptoms Medical History: Negative for:  Cirrhosis ; Colitis; Crohnos; Hepatitis A; Hepatitis B; Hepatitis C Endocrine Complaints and Symptoms: No Complaints or Symptoms Medical History: Positive for: Type I Diabetes - over 39 years Time with diabetes: 67 years Treated with: Insulin Blood sugar tested every day: Yes Tested : 4-5times a day Blood sugar testing results: Lunch: 263 [name], [name]. ([id number]) Genitourinary Complaints and Symptoms: No Complaints or Symptoms Medical History: Negative for: End Stage Renal Disease Immunological Complaints and Symptoms: No Complaints or Symptoms Medical History: Negative for: Lupus Erythematosus; Raynaudos; Scleroderma Musculoskeletal Complaints and Symptoms: No Complaints or Symptoms Medical History: Positive for: Rheumatoid Arthritis; Osteoarthritis Neurologic Complaints and Symptoms: No Complaints or Symptoms Medical History: Positive for: Neuropathy Negative for: Dementia Oncologic Complaints and Symptoms: No Complaints or Symptoms Complaints and Symptoms: Review of System Notes: current treatment for Chemo once every 3 weeks. She has had 4 chemo. After surgery she will have 3 more due for surgery in 4 weeks for hysterectomy Medical History: Positive for: Received Chemotherapy Psychiatric Complaints and Symptoms: No Complaints or Symptoms [name], [name]. ([id number]) HBO Extended History Items Eyes: Cataracts Family and Social History Cancer: Yes - Siblings; Diabetes: Yes - Mother, Siblings; Heart Disease: Yes - Father, Mother, Siblings; Hereditary Spherocytosis: No; Hypertension: Yes - Siblings, Father, Mother; Kidney Disease: No; Lung Disease: No; Seizures: No; Stroke: No; Thyroid Problems: No; Tuberculosis: No; Former smoker; Marital Status - Married; Alcohol Use: Never; Drug Use: No History; Caffeine Use: Never; Financial Concerns: No; Food, Clothing or Shelter Needs: No; Support System Lacking: No; Transportation Concerns: No; Advanced  Directives: Yes (Not Provided); Patient does not want information on Advanced Directives; Living Will: Yes (Not Provided); Medical Power of Attorney: Yes - [name] (Not Provided) Physician Affirmation I have reviewed and agree with the above information. Electronic Signature(s) Signed: 05/25/2015 1:51:07 PM By: Janet Fudge MD, FACS Signed: 05/25/2015 3:41:33 PM By: [name] Entered By: [name] on 05/25/2015 13:51:06 [name] ([id number]) -------------------------------------------------------------------------------- SuperBill Details  Patient Name: [name], [name] Date of Service: 05/25/2015 Medical Record Patient Account Number: [id number] Number: [name], Treating [name]: August 01, 1945 [phone] y.o. 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, stage 3 Z92.21 Personal history of antineoplastic chemotherapy Facility Procedures CPT4 Code: [id number] Description: 99213 - WOUND CARE VISIT-LEV 3 EST PT Modifier: Quantity: 1 CPT4 Code: [id number] Description: [id number] - DEB SUBQ TISSUE 20 SQ CM/< ICD-10 Description Diagnosis 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

## 2015-05-25 NOTE — Progress Notes (Signed)
[name], [name] ([id number]) Visit Report for 05/25/2015 Abuse/Suicide Risk Screen Details Patient Name: [name], [name] Date of Service: 05/25/2015 1:30 PM Medical Record Patient Account Number: [id number] Number: Afful, RN, BSN, Treating RN: 23-Oct-1945 [phone] y.o. Velva Harman Date of Birth/Sex: Female) Other Clinician: Primary Care Physician: Ronette Deter Treating Christin Fudge Referring Physician: Ronette Deter Physician/Extender: Suella Grove in Treatment: 0 Abuse/Suicide Risk Screen Items Answer ABUSE/SUICIDE RISK SCREEN: Has anyone close to you tried to hurt or harm you recentlyo No Do you feel uncomfortable with anyone in your familyo No Has anyone forced you do things that you didnot want to doo No Do you have any thoughts of harming yourselfo No Patient displays signs or symptoms of abuse and/or neglect. No Electronic Signature(s) Signed: 05/25/2015 3:41:33 PM By: Regan Lemming BSN, RN Entered By: Regan Lemming on 05/25/2015 13:30:49 [name] ([id number]) -------------------------------------------------------------------------------- Activities of Daily Living Details Patient Name: [name] Date of Service: 05/25/2015 1:30 PM Medical Record Patient Account Number: [id number] Number: Afful, RN, BSN, Treating RN: 1945/11/23 [phone] y.o. Velva Harman Date of Birth/Sex: Female) Other Clinician: Primary Care Physician: Ronette Deter Treating Christin Fudge Referring Physician: Ronette Deter Physician/Extender: Suella Grove in Treatment: 0 Activities of Daily Living Items Answer Activities of Daily Living (Please select one for each item) Drive Automobile Need Assistance Take Medications Completely Able Use Telephone Need Assistance Care for Appearance Need Assistance Use Toilet Need Assistance Bath / Shower Need Assistance Dress Self Need Assistance Feed Self Need Assistance Walk Need Assistance Get In / Out Bed Need Assistance Housework Need  Assistance Prepare Meals Need Assistance Handle Money Need Assistance Shop for Self Need Assistance Electronic Signature(s) Signed: 05/25/2015 3:41:33 PM By: Regan Lemming BSN, RN Entered By: Regan Lemming on 05/25/2015 13:33:12 [name] ([id number]) -------------------------------------------------------------------------------- Education Assessment Details Patient Name: [name] Date of Service: 05/25/2015 1:30 PM Medical Record Patient Account Number: [id number] Number: Afful, RN, BSN, Treating RN: 23-Aug-1945 (70 y.o. Velva Harman Date of Birth/Sex: Female) Other Clinician: Primary Care Physician: Ronette Deter Treating Christin Fudge Referring Physician: Ronette Deter Physician/Extender: Suella Grove in Treatment: 0 Primary Learner Assessed: Patient Learning Preferences/Education Level/Primary Language Learning Preference: Explanation Highest Education Level: High School Preferred Language: English Cognitive Barrier Assessment/Beliefs Language Barrier: No Physical Barrier Assessment Impaired Vision: Yes Glasses Impaired Hearing: No Decreased Hand dexterity: No Knowledge/Comprehension Assessment Knowledge Level: High Comprehension Level: High Ability to understand written High instructions: Motivation Assessment Anxiety Level: Calm Cooperation: Cooperative Education Importance: Acknowledges Need Interest in Health Problems: Asks Questions Perception: Coherent Willingness to Engage in Self- High Management Activities: Readiness to Engage in Self- High Management Activities: Electronic Signature(s) Signed: 05/25/2015 3:41:33 PM By: Regan Lemming BSN, RN Entered By: Regan Lemming on 05/25/2015 13:32:38 [name], [name] ([id number]) -------------------------------------------------------------------------------- Fall Risk Assessment Details Patient Name: [name] Date of Service: 05/25/2015 1:30 PM Medical Record Patient Account Number:  [id number] Number: Afful, RN, BSN, Treating RN: 07-10-1945 [phone] y.o. Velva Harman Date of Birth/Sex: Female) Other Clinician: Primary Care Physician: Ronette Deter Treating Christin Fudge Referring Physician: Ronette Deter Physician/Extender: Suella Grove in Treatment: 0 Fall Risk Assessment Items Have you had 2 or more falls in the last 12 monthso 0 No Have you had any fall that resulted in injury in the last 12 monthso 0 No FALL RISK ASSESSMENT: History of falling - immediate or within 3 months 0 No Secondary diagnosis 0 No Ambulatory aid None/bed rest/wheelchair/nurse 0 Yes Crutches/cane/walker 0 No Furniture 0 No IV Access/Saline Lock 0 No Gait/Training Normal/bed rest/immobile 0 Yes  Weak 0 No Impaired 0 No Mental Status Oriented to own ability 0 Yes Electronic Signature(s) Signed: 05/25/2015 3:41:33 PM By: Regan Lemming BSN, RN Entered By: Regan Lemming on 05/25/2015 13:32:13 [name] ([id number]) -------------------------------------------------------------------------------- Foot Assessment Details Patient Name: [name] Date of Service: 05/25/2015 1:30 PM Medical Record Patient Account Number: [id number] Number: Afful, RN, BSN, Treating RN: 05/09/1945 [phone] y.o. Velva Harman Date of Birth/Sex: Female) Other Clinician: Primary Care Physician: Ronette Deter Treating Christin Fudge Referring Physician: Ronette Deter Physician/Extender: Suella Grove in Treatment: 0 Foot Assessment Items Site Locations + = Sensation present, - = Sensation absent, C = Callus, U = Ulcer R = Redness, W = Warmth, M = Maceration, PU = Pre-ulcerative lesion F = Fissure, S = Swelling, D = Dryness Assessment Right: Left: Other Deformity: No No Prior Foot Ulcer: No No Prior Amputation: No No Charcot Joint: No No Ambulatory Status: Ambulatory With Help Assistance Device: Cane Gait: Steady Electronic Signature(s) Signed: 05/25/2015 3:41:33 PM By: Regan Lemming BSN, RN Entered  By: Regan Lemming on 05/25/2015 13:31:15 [name] ([id number]) [name], [name] ([id number]) -------------------------------------------------------------------------------- Nutrition Risk Assessment Details Patient Name: [name] Date of Service: 05/25/2015 1:30 PM Medical Record Patient Account Number: [id number] Number: Afful, RN, BSN, Treating RN: [birth year] (70 y.o. Velva Harman Date of Birth/Sex: Female) Other Clinician: Primary Care Physician: Ronette Deter Treating Christin Fudge Referring Physician: Ronette Deter Physician/Extender: Suella Grove in Treatment: 0 Height (in): Weight (lbs): Body Mass Index (BMI): Nutrition Risk Assessment Items NUTRITION RISK SCREEN: I have an illness or condition that made me change the kind and/or 0 No amount of food I eat I eat fewer than two meals per day 0 No I eat few fruits and vegetables, or milk products 0 No I have three or more drinks of beer, liquor or wine almost every day 0 No I have tooth or mouth problems that make it hard for me to eat 0 No I don't always have enough money to buy the food I need 0 No I eat alone most of the time 0 No I take three or more different prescribed or over-the-counter drugs a 0 No day Without wanting to, I have lost or gained 10 pounds in the last six 2 Yes months I am not always physically able to shop, cook and/or feed myself 0 No Nutrition Protocols Good Risk Protocol 0 No interventions needed Moderate Risk Protocol Electronic Signature(s) Signed: 05/25/2015 3:41:33 PM By: Regan Lemming BSN, RN Entered By: Regan Lemming on 05/25/2015 13:31:57

## 2015-05-28 DIAGNOSIS — C561 Malignant neoplasm of right ovary: Secondary | ICD-10-CM | POA: Insufficient documentation

## 2015-05-28 DIAGNOSIS — E1065 Type 1 diabetes mellitus with hyperglycemia: Secondary | ICD-10-CM | POA: Insufficient documentation

## 2015-05-28 DIAGNOSIS — C800 Disseminated malignant neoplasm, unspecified: Secondary | ICD-10-CM

## 2015-05-29 ENCOUNTER — Inpatient Hospital Stay: Payer: Medicare Other

## 2015-05-29 ENCOUNTER — Inpatient Hospital Stay (HOSPITAL_BASED_OUTPATIENT_CLINIC_OR_DEPARTMENT_OTHER): Payer: Medicare Other | Admitting: Hematology and Oncology

## 2015-05-29 ENCOUNTER — Encounter: Payer: Self-pay | Admitting: Hematology and Oncology

## 2015-05-29 ENCOUNTER — Other Ambulatory Visit: Payer: Self-pay

## 2015-05-29 VITALS — BP 105/67 | HR 78 | Temp 97.9°F | Resp 18 | Ht 63.0 in | Wt 142.6 lb

## 2015-05-29 DIAGNOSIS — C569 Malignant neoplasm of unspecified ovary: Secondary | ICD-10-CM

## 2015-05-29 DIAGNOSIS — E108 Type 1 diabetes mellitus with unspecified complications: Secondary | ICD-10-CM

## 2015-05-29 DIAGNOSIS — D72819 Decreased white blood cell count, unspecified: Secondary | ICD-10-CM

## 2015-05-29 DIAGNOSIS — Z862 Personal history of diseases of the blood and blood-forming organs and certain disorders involving the immune mechanism: Secondary | ICD-10-CM

## 2015-05-29 DIAGNOSIS — R634 Abnormal weight loss: Secondary | ICD-10-CM

## 2015-05-29 DIAGNOSIS — E871 Hypo-osmolality and hyponatremia: Secondary | ICD-10-CM

## 2015-05-29 DIAGNOSIS — I10 Essential (primary) hypertension: Secondary | ICD-10-CM

## 2015-05-29 DIAGNOSIS — N959 Unspecified menopausal and perimenopausal disorder: Secondary | ICD-10-CM

## 2015-05-29 DIAGNOSIS — E709 Disorder of aromatic amino-acid metabolism, unspecified: Secondary | ICD-10-CM | POA: Diagnosis not present

## 2015-05-29 DIAGNOSIS — I252 Old myocardial infarction: Secondary | ICD-10-CM

## 2015-05-29 DIAGNOSIS — M359 Systemic involvement of connective tissue, unspecified: Secondary | ICD-10-CM

## 2015-05-29 DIAGNOSIS — G629 Polyneuropathy, unspecified: Secondary | ICD-10-CM

## 2015-05-29 DIAGNOSIS — C801 Malignant (primary) neoplasm, unspecified: Secondary | ICD-10-CM

## 2015-05-29 DIAGNOSIS — I25810 Atherosclerosis of coronary artery bypass graft(s) without angina pectoris: Secondary | ICD-10-CM

## 2015-05-29 DIAGNOSIS — C786 Secondary malignant neoplasm of retroperitoneum and peritoneum: Secondary | ICD-10-CM | POA: Diagnosis not present

## 2015-05-29 DIAGNOSIS — L89312 Pressure ulcer of right buttock, stage 2: Secondary | ICD-10-CM | POA: Diagnosis not present

## 2015-05-29 DIAGNOSIS — Z79899 Other long term (current) drug therapy: Secondary | ICD-10-CM

## 2015-05-29 DIAGNOSIS — D709 Neutropenia, unspecified: Secondary | ICD-10-CM

## 2015-05-29 DIAGNOSIS — M069 Rheumatoid arthritis, unspecified: Secondary | ICD-10-CM

## 2015-05-29 DIAGNOSIS — Z9641 Presence of insulin pump (external) (internal): Secondary | ICD-10-CM

## 2015-05-29 DIAGNOSIS — Z7982 Long term (current) use of aspirin: Secondary | ICD-10-CM

## 2015-05-29 DIAGNOSIS — Z87891 Personal history of nicotine dependence: Secondary | ICD-10-CM

## 2015-05-29 DIAGNOSIS — I5032 Chronic diastolic (congestive) heart failure: Secondary | ICD-10-CM

## 2015-05-29 DIAGNOSIS — Z794 Long term (current) use of insulin: Secondary | ICD-10-CM

## 2015-05-29 DIAGNOSIS — L8990 Pressure ulcer of unspecified site, unspecified stage: Secondary | ICD-10-CM

## 2015-05-29 DIAGNOSIS — K219 Gastro-esophageal reflux disease without esophagitis: Secondary | ICD-10-CM

## 2015-05-29 DIAGNOSIS — K5900 Constipation, unspecified: Secondary | ICD-10-CM

## 2015-05-29 DIAGNOSIS — E785 Hyperlipidemia, unspecified: Secondary | ICD-10-CM

## 2015-05-29 DIAGNOSIS — D702 Other drug-induced agranulocytosis: Secondary | ICD-10-CM

## 2015-05-29 LAB — CBC WITH DIFFERENTIAL/PLATELET
Basophils Absolute: 0 10*3/uL (ref 0–0.1)
Basophils Relative: 1 %
Eosinophils Absolute: 0 10*3/uL (ref 0–0.7)
Eosinophils Relative: 5 %
HCT: 27.4 % — ABNORMAL LOW (ref 35.0–47.0)
Hemoglobin: 9.4 g/dL — ABNORMAL LOW (ref 12.0–16.0)
Lymphocytes Relative: 48 %
Lymphs Abs: 0.4 10*3/uL — ABNORMAL LOW (ref 1.0–3.6)
MCH: 29.9 pg (ref 26.0–34.0)
MCHC: 34.4 g/dL (ref 32.0–36.0)
MCV: 87 fL (ref 80.0–100.0)
Monocytes Absolute: 0.1 10*3/uL — ABNORMAL LOW (ref 0.2–0.9)
Monocytes Relative: 9 %
Neutro Abs: 0.3 10*3/uL — ABNORMAL LOW (ref 1.4–6.5)
Neutrophils Relative %: 37 %
Platelets: 147 10*3/uL — ABNORMAL LOW (ref 150–440)
RBC: 3.15 MIL/uL — ABNORMAL LOW (ref 3.80–5.20)
RDW: 18.6 % — ABNORMAL HIGH (ref 11.5–14.5)
WBC: 0.9 10*3/uL — CL (ref 3.6–11.0)

## 2015-05-29 LAB — BASIC METABOLIC PANEL
Anion gap: 7 (ref 5–15)
BUN: 15 mg/dL (ref 6–20)
CO2: 23 mmol/L (ref 22–32)
Calcium: 9.1 mg/dL (ref 8.9–10.3)
Chloride: 98 mmol/L — ABNORMAL LOW (ref 101–111)
Creatinine, Ser: 0.76 mg/dL (ref 0.44–1.00)
GFR calc Af Amer: >60 mL/min (ref >60)
GFR calc non Af Amer: >60 mL/min (ref >60)
Glucose, Bld: 167 mg/dL — ABNORMAL HIGH (ref 65–99)
Potassium: 4.1 mmol/L (ref 3.5–5.1)
Sodium: 128 mmol/L — ABNORMAL LOW (ref 135–145)

## 2015-05-29 LAB — MAGNESIUM: Magnesium: 1.1 mg/dL — ABNORMAL LOW (ref 1.7–2.4)

## 2015-05-29 MED ORDER — SODIUM CHLORIDE 0.9% FLUSH
10.0000 mL | Freq: Once | INTRAVENOUS | Status: AC
  Administered 2015-05-29: 10 mL via INTRAVENOUS
  Filled 2015-05-29: qty 10

## 2015-05-29 MED ORDER — HEPARIN SOD (PORK) LOCK FLUSH 100 UNIT/ML IV SOLN
500.0000 [IU] | Freq: Once | INTRAVENOUS | Status: AC
  Administered 2015-05-29: 500 [IU] via INTRAVENOUS
  Filled 2015-05-29: qty 5

## 2015-05-29 MED ORDER — TBO-FILGRASTIM 300 MCG/0.5ML ~~LOC~~ SOSY
300.0000 ug | PREFILLED_SYRINGE | Freq: Once | SUBCUTANEOUS | Status: AC
  Administered 2015-05-29: 300 ug via SUBCUTANEOUS
  Filled 2015-05-29: qty 0.5

## 2015-05-29 MED ORDER — SODIUM CHLORIDE 0.9 % IV SOLN
INTRAVENOUS | Status: DC
  Administered 2015-05-29: 12:00:00 via INTRAVENOUS
  Filled 2015-05-29: qty 1000

## 2015-05-29 MED ORDER — FILGRASTIM 300 MCG/0.5ML IJ SOSY
300.0000 ug | PREFILLED_SYRINGE | Freq: Once | INTRAMUSCULAR | Status: DC
Start: 2015-05-29 — End: 2015-05-29

## 2015-05-29 MED ORDER — MAGNESIUM SULFATE 2 GM/50ML IV SOLN
2.0000 g | Freq: Once | INTRAVENOUS | Status: AC
  Administered 2015-05-29: 2 g via INTRAVENOUS
  Filled 2015-05-29: qty 50

## 2015-05-29 NOTE — Progress Notes (Signed)
[facility] day:  05/29/2015   Chief Complaint: [name] is a 70 y.o. 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    HPI:  The patient was last seen in the medical oncology clinic on 05/21/2015.  At that time, her decubitus ulcer had improved slightly.  There was no evidence of infection.  She received cycle #4 carboplatin and Taxol (without dose escalation) on 05/22/2015.    She states that this cycle was harder.  She has felt weak and fatigued, but recently slightly better.  Her appetite has been bad.  She has lost 3 pounds.  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  She denies neuropathy in her feet, but notes her legs are weak.  She has some constipation.  She denies any fevers.  Past Medical History  Diagnosis Date  . Leukopenia 2012    s/p bone marrow biopsy, Dr. Ma Hillock  . Cholelithiasis   . GERD (gastroesophageal reflux disease)   . Hypertension   . Hyperlipidemia   . Esophageal stricture   . Chronic diastolic CHF (congestive heart failure) ([place])   . CAD (coronary artery disease)     a. 09/2012 Cath: LM nl, LAD 95p, 22m LCX 942mOM2 50, RCA 100.  . Marland Kitchenypokalemia   . Herniated disc   . Pneumonia 2013; 08/2012    "one lung; double" (10/18/2012)  . Exertional shortness of breath   . Type I diabetes mellitus [place]    "[place] in 1957" (10/18/2012)  . History of pancytopenia   . H/O hiatal hernia   . Rheumatoid arthritis(714.0)   . NSTEMI (non-ST elevated myocardial infarction) ([facility]/2014    "mild" (10/18/2012)  . Collagen vascular disease ([facility]  . [facility]    Past Surgical History  Procedure Laterality Date  . Esophagogastroduodenoscopy  2012    Dr. IfMinna Merritts. Tubal ligation  1970  . Cataract extraction w/ intraocular lens implant Right 2010  . Cardiac catheterization  10/18/2012   "first one was today" (10/18/2012)  . Esophageal dilation      "3 or 4 times" (10/19/2011)  . Coronary artery bypass graft N/A 10/19/2012    Procedure: CORONARY ARTERY BYPASS GRAFTING (CABG);  Surgeon: StMelrose NakayamaMD;  [facility] Service: Open Heart Surgery;  Laterality: N/A;  . Peripheral vascular catheterization N/A 03/02/2015    Procedure: Porta Cath Insertion;  Surgeon: JaAlgernon HuxleyMD;  Location: [facility];  Service: Cardiovascular;  Laterality: N/A;    Family History  Problem Relation Age of Onset  . Diabetes Mother   . Arthritis Mother   . Diabetes Father   . Arthritis Father   . Bone cancer Sister     Social History:  reports that she quit smoking about 20 years ago. Her smoking use included Cigarettes. She has a 30 pack-year smoking history. She has never used smokeless tobacco. She reports that she does not drink alcohol or use illicit drugs.  The patient is accompanied by her husband today.  Allergies:  Allergies  Allergen Reactions  . Codeine     nausea    Current Medications: Current Outpatient Prescriptions  Medication Sig Dispense Refill  . amLODipine (NORVASC) 5 MG tablet TAKE 1 TABLET BY MOUTH ONCE A DAY 90 tablet 2  . aspirin 81 MG tablet Take 81 mg by mouth  daily.    . atorvastatin (LIPITOR) 80 MG tablet Take 1 tablet (80 mg total) by mouth daily. 90 tablet 3  . Cholecalciferol (VITAMIN D) 2000 UNITS CAPS Take 1 capsule by mouth daily.    Marland Kitchen dexamethasone (DECADRON) 4 MG tablet 20 mg (5 tablets) 12 hours and 6 hours prior to chemotherapy 30 tablet 1  . etanercept (ENBREL) 25 MG injection Inject 25 mg into the skin once a week. Reported on 0/34/7425    . folic acid (FOLVITE) 1 MG tablet Take 1 tablet (1 mg total) by mouth daily. 90 tablet 3  . furosemide (LASIX) 40 MG tablet Take 1 tablet (40 mg total) by mouth 2 (two) times daily. 180 tablet 3  . ibuprofen (ADVIL,MOTRIN) 800 MG tablet Take 1 tablet (800 mg total) by mouth 3 (three)  times daily as needed. 270 tablet 3  . insulin lispro (HUMALOG) 100 UNIT/ML injection As directed via Insulin pump 30 mL 5  . lidocaine-prilocaine (EMLA) cream Apply 1 application topically as needed. Apply application 1 hour prior to treatment.  Cover with Press n seal until treatment time. 30 g 1  . loratadine (CLARITIN) 10 MG tablet Take 10 mg by mouth daily.    Marland Kitchen LORazepam (ATIVAN) 0.5 MG tablet Take 1 tablet (0.5 mg total) by mouth every 6 (six) hours as needed (nausea). 30 tablet 0  . losartan (COZAAR) 100 MG tablet Take 1 tablet (100 mg total) by mouth daily. 90 tablet 3  . magnesium oxide (MAG-OX) 400 MG tablet Take 400 mg by mouth daily. Reported on 05/19/2015    . metoprolol tartrate (LOPRESSOR) 25 MG tablet Take 1 tablet (25 mg total) by mouth 2 (two) times daily as needed. 180 tablet 3  . Multiple Vitamin (MULTIVITAMIN) tablet Take 1 tablet by mouth daily.      . ondansetron (ZOFRAN) 8 MG tablet Take 1 tablet (8 mg total) by mouth every 8 (eight) hours as needed for nausea or vomiting. 20 tablet 1  . pantoprazole (PROTONIX) 40 MG tablet Take 1 tablet (40 mg total) by mouth 2 (two) times daily. (Patient taking differently: Take 40 mg by mouth daily. ) 180 tablet 3  . potassium chloride SA (K-DUR,KLOR-CON) 10 MEQ tablet Take 1 tablet (10 mEq total) by mouth 2 (two) times daily. 20 tablet 0  . pregabalin (LYRICA) 75 MG capsule Take 75 mg by mouth 2 (two) times daily.    . ranitidine (ZANTAC) 150 MG tablet Take 1 tablet (150 mg total) by mouth 2 (two) times daily. 180 tablet 2  . saccharomyces boulardii (FLORASTOR) 250 MG capsule Take 250 mg by mouth as needed. Reported on 05/06/2015    . Wound Dressings (MEDIHONEY CA ALGINATE 2"X2") PADS Apply to sacral wound daily. 10 each 3  . zoledronic acid (RECLAST) 5 MG/100ML SOLN Inject 5 mg into the vein once.    . methotrexate (RHEUMATREX) 2.5 MG tablet Take 4 tablets (10 mg total) by mouth once a week. Takes every Wednesday.Caution:Chemotherapy.  Protect from light. 64 tablet 3   No current facility-administered medications for this visit.   Facility-Administered Medications Ordered in Other Visits  Medication Dose Route Frequency Provider Last Rate Last Dose  . sodium chloride 0.9 % injection 10 mL  10 mL Intracatheter PRN Lequita Asal, MD      . sodium chloride 0.9 % injection 10 mL  10 mL Intravenous PRN Lequita Asal, MD   10 mL at 04/13/15 9563    Review of Systems:  GENERAL:  Weak and fatigued.  No fevers or sweats.  Weight down 3 pounds. PERFORMANCE STATUS (ECOG):  1 HEENT:  No visual changes, sore throat, mouth sores or tenderness. Lungs:  No shortness of breath.  No cough.  No hemoptysis. Cardiac:  No chest pain, palpitations, orthopnea, or PND. GI:  Appetite poor.  Nausea.  Constipation.  No vomiting.  No diarrhea, melena or hematochezia. GU:  No urgency, frequency, dysuria, or hematuria. Musculoskeletal:  Severe rheumatoid arthritis.  Osteoporosis.  No back pain. No muscle tenderness. Extremities:  No pain or swelling. Skin:  Decubitus ulcer, improved.  No rashes or skin changes. Neuro:  No headache, numbness or weakness, balance or coordination issues. Endocrine:  Diabetes.  Blood sugar up with recent adjustment in insulin pump.  No thyroid issues, hot flashes or night sweats. Psych:  No mood changes, depression or anxiety. Pain:  No focal pain. Review of systems:  All other systems reviewed and found to be negative.  Physical Exam: Blood pressure 105/67, pulse 78, temperature 97.9 F (36.6 C), temperature source Tympanic, resp. rate 18, height 5' 3"  (1.6 m), weight 142 lb 10.2 oz (64.7 kg). GENERAL:  Well developed, well nourished, sitting comfortably in the exam room in no acute distress.  She has a cane at her side. MENTAL STATUS:  Alert and oriented to person, place and time. HEAD:  Wearing a cream colored hat.  Normocephalic, atraumatic, face symmetric, no Cushingoid features. EYES:  Gold rimmed  glasses.  Blue eyes.  No conjunctivitis or scleral icterus.  ENT: Oropharynx clear without lesion. Tongue normal. Mucous membranes moist.  RESPIRATORY: Clear to auscultation without rales, wheezes or rhonchi. CARDIOVASCULAR: Regular rate and rhythm without murmur, rub or gallop. ABDOMEN: Soft, non-tender with active bowel sounds and no appreciable hepatosplenomegaly. No palpable nodularity or masses. RECTAL:  1 cm decubitus ulcer, stage II (deep), with clean borders and no evidence of infection, near right gluteal fold. SKIN: Decubitus ulcer as above.  Hyperpigmentation around decubitus in shape of a bandage (taper irritation).  No other rashes, ulcers or lesions. EXTREMITIES: No edema, skin discoloration or tenderness. No palpable cords. LYMPH NODES: No palpable cervical, supraclavicular, axillary or inguinal adenopathy  NEUROLOGICAL: Appropriate. PSYCH: Appropriate.  Appointment on 05/29/2015  Component Date Value Ref Range Status  . Magnesium 05/29/2015 1.1* 1.7 - 2.4 mg/dL Final  . WBC 05/29/2015 0.9* 3.6 - 11.0 K/uL Final   Comment: RESULT REPEATED AND VERIFIED [facility] CRITICAL VALUE PROTOCOL   . RBC 05/29/2015 3.15* 3.80 - 5.20 MIL/uL Final  . Hemoglobin 05/29/2015 9.4* 12.0 - 16.0 g/dL Final  . HCT 05/29/2015 27.4* 35.0 - 47.0 % Final  . MCV 05/29/2015 87.0  80.0 - 100.0 fL Final  . MCH 05/29/2015 29.9  26.0 - 34.0 pg Final  . MCHC 05/29/2015 34.4  32.0 - 36.0 g/dL Final  . RDW 05/29/2015 18.6* 11.5 - 14.5 % Final  . Platelets 05/29/2015 147* 150 - 440 K/uL Final  . Neutrophils Relative % 05/29/2015 37%   Final  . Neutro Abs 05/29/2015 0.3* 1.4 - 6.5 K/uL Final   Comment: RESULT REPEATED AND VERIFIED CRITICAL RESULT CALLED TO, READ BACK BY AND VERIFIED WITH: [facility] AT 0910 05/29/2015 KMR   . Lymphocytes Relative 05/29/2015 48%   Final  . Lymphs Abs 05/29/2015 0.4* 1.0 - 3.6 K/uL Final  . Monocytes Relative 05/29/2015 9%   Final  . Monocytes Absolute  05/29/2015 0.1* 0.2 - 0.9 K/uL Final  . Eosinophils Relative 05/29/2015 5%  Final  . Eosinophils Absolute 05/29/2015 0.0  0 - 0.7 K/uL Final  . Basophils Relative 05/29/2015 1%   Final  . Basophils Absolute 05/29/2015 0.0  0 - 0.1 K/uL Final  . Sodium 05/29/2015 128* 135 - 145 mmol/L Final  . Potassium 05/29/2015 4.1  3.5 - 5.1 mmol/L Final  . Chloride 05/29/2015 98* 101 - 111 mmol/L Final  . CO2 05/29/2015 23  22 - 32 mmol/L Final  . Glucose, Bld 05/29/2015 167* 65 - 99 mg/dL Final  . BUN 05/29/2015 15  6 - 20 mg/dL Final  . Creatinine, Ser 05/29/2015 0.76  0.44 - 1.00 mg/dL Final  . Calcium 05/29/2015 9.1  8.9 - 10.3 mg/dL Final  . GFR calc non Af Amer 05/29/2015 >60  >60 mL/min Final  . GFR calc Af Amer 05/29/2015 >60  >60 mL/min Final   Comment: (NOTE) The eGFR has been calculated using the CKD EPI equation. This calculation has not been validated in all clinical situations. eGFR's persistently <60 mL/min signify possible Chronic Kidney Disease.   [name] gap 05/29/2015 7  5 - 15 Final    Assessment:  MASAYO FERA is a 70 y.o. female with clinical stage IIIC (T3cN1Mx) ovarian cancer presenting with abdominal discomfort and bloating.  Omental biopsy on 02/23/2015 revealed metastatic high grade serous carcinoma, consistent with gynecologic origin.  CA125 was 707 on 02/17/2015.  Abdomen and pelvic CT scan on 02/13/2015 revealed bilateral mass-like adnexal regions (right adnexal mass 5.6 x 5.0 cm and the left adnexa mass 10.3 x 6.0 x 9.9 cm).  There was a large amount of soft tissue throughout the peritoneal cavity involving the omentum and other peritoneal surfaces.  There was a small volume ascites. There was a peripheral 3.3 x 1.9 cm low-attenuation lesion overlying the right lobe of the liver , likely representing a serosal implant. There was 1.4 x 2.2 cm ill-defined peripheral lesion within the inferior aspect of segment 6 adjacent to the ampulla in the duodenum.   She has severe  rheumatoid arthritis.  Methotrexate and Enbrel are on hold.  She has a normocytic anemia.  Work-up on 02/17/2015 revealed a normal ferritin, B12, folate, TSH.  She denies any melena or hematochezia.  She has diabetes and is on an insulin pump.  She is currently day 8 status post cycle #4 neoadjuvant carboplatin and Taxol (03/05/2015 - 05/22/2015).  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  She had loose stools on oral magnesium.  She was initially on Neurontin then switched Lyrica with cycle #3.  CA125 was 802.9 on 03/30/2015, 567.9 on 04/13/2015, and 168.8 on 05/15/2015.  Abdominal and pelvic CT scan on 04/28/2015 revealed decreasing bilateral ovarian masses.   The left adnexal mass measured 4.0 x 6.1 cm (previously 5.0 x 8.5 cm). The right ovary measured 3.8 x 4.9 cm (previously 4.7 x 5.6 cm).  There was improved peritoneal carcinomatosis.  There was a small amount of ascites.  The hepatic dome lesion was stable. The previously seen right hepatic lobe lesion was not well-visualized.  There was a small left pleural effusion and trace right pleural effusion.  The nodular lesion at the ampulla of Vater, extending into the duodenum was stable.  There was no biliary ductal dilatation.  Symptomatically, she is fatigued and appetite is poor.  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  She has a 1 cm clean stage II right gluteal fold decubitus ulcer.  She is neutropenic ([place] 300).  Magnesium is low (1.1).  Plan: 1.  Labs today:  CBC with diff, BMP, Mg. 2.  Review fever and neutropenia precautions. 3.  GCSF 300 mcg SQ today and next week if needed.  Anticipate Neulasta with future cycles (per GYN plan to increase carboplatin AUC to 6). 4.  Magnesium 2 gm IV today. 5.  Discuss plans for surgery and preop evaluation at [facility] 06/09/2015. 6.  RTC on 06/01/2015 for labs (CBC with diff, Mg) +/- GCSF 7.  RTC on 06/05/2015 for MD assess, evaluation of decubitus ulcer, and +/- labs.    Lequita Asal,  MD  05/29/2015, 11:26 AM

## 2015-06-01 ENCOUNTER — Inpatient Hospital Stay: Payer: Medicare Other

## 2015-06-01 ENCOUNTER — Encounter: Payer: Medicare Other | Admitting: Surgery

## 2015-06-01 DIAGNOSIS — E10610 Type 1 diabetes mellitus with diabetic neuropathic arthropathy: Secondary | ICD-10-CM | POA: Diagnosis not present

## 2015-06-01 DIAGNOSIS — D702 Other drug-induced agranulocytosis: Secondary | ICD-10-CM

## 2015-06-01 DIAGNOSIS — C569 Malignant neoplasm of unspecified ovary: Secondary | ICD-10-CM | POA: Diagnosis not present

## 2015-06-01 LAB — CBC WITH DIFFERENTIAL/PLATELET
Basophils Absolute: 0 10*3/uL (ref 0–0.1)
Basophils Relative: 1 %
Eosinophils Absolute: 0.1 10*3/uL (ref 0–0.7)
Eosinophils Relative: 3 %
HCT: 28.6 % — ABNORMAL LOW (ref 35.0–47.0)
Hemoglobin: 9.9 g/dL — ABNORMAL LOW (ref 12.0–16.0)
Lymphocytes Relative: 37 %
Lymphs Abs: 0.7 10*3/uL — ABNORMAL LOW (ref 1.0–3.6)
MCH: 30.2 pg (ref 26.0–34.0)
MCHC: 34.5 g/dL (ref 32.0–36.0)
MCV: 87.3 fL (ref 80.0–100.0)
Monocytes Absolute: 0.9 10*3/uL (ref 0.2–0.9)
Monocytes Relative: 45 %
Neutro Abs: 0.3 10*3/uL — ABNORMAL LOW (ref 1.4–6.5)
Neutrophils Relative %: 14 %
Platelets: 154 10*3/uL (ref 150–440)
RBC: 3.28 MIL/uL — ABNORMAL LOW (ref 3.80–5.20)
RDW: 18.8 % — ABNORMAL HIGH (ref 11.5–14.5)
WBC: 1.9 10*3/uL — ABNORMAL LOW (ref 3.6–11.0)

## 2015-06-01 MED ORDER — FILGRASTIM 300 MCG/0.5ML IJ SOSY: 300.0000 ug | PREFILLED_SYRINGE | Freq: Once | INTRAMUSCULAR | Status: DC

## 2015-06-01 MED ORDER — TBO-FILGRASTIM 300 MCG/0.5ML ~~LOC~~ SOSY
300.0000 ug | PREFILLED_SYRINGE | Freq: Once | SUBCUTANEOUS | Status: AC
  Administered 2015-06-01: 300 ug via SUBCUTANEOUS
  Filled 2015-06-01: qty 0.5

## 2015-06-02 ENCOUNTER — Inpatient Hospital Stay: Payer: Medicare Other

## 2015-06-02 DIAGNOSIS — D709 Neutropenia, unspecified: Secondary | ICD-10-CM

## 2015-06-02 DIAGNOSIS — C569 Malignant neoplasm of unspecified ovary: Secondary | ICD-10-CM | POA: Diagnosis not present

## 2015-06-02 MED ORDER — FILGRASTIM 300 MCG/0.5ML IJ SOSY: 300.0000 ug | PREFILLED_SYRINGE | Freq: Once | INTRAMUSCULAR | Status: DC

## 2015-06-02 MED ORDER — TBO-FILGRASTIM 300 MCG/0.5ML ~~LOC~~ SOSY
300.0000 ug | PREFILLED_SYRINGE | Freq: Once | SUBCUTANEOUS | Status: AC
  Administered 2015-06-02: 300 ug via SUBCUTANEOUS
  Filled 2015-06-02: qty 0.5

## 2015-06-02 NOTE — Progress Notes (Signed)
[name], [name] ([id number]) Visit Report for 06/01/2015 Arrival Information Details Patient Name: [name], [name] Date of Service: 06/01/2015 10:15 AM Medical Record Number: [id number] Patient Account Number: [id number] Date of Birth/Sex: Sep 11, 1945 (70 y.o. Female) Treating RN: Montey Hora Primary Care Physician: Ronette Deter Other Clinician: Referring Physician: Ronette Deter Treating Physician/Extender: Frann Rider in Treatment: 1 Visit Information History Since Last Visit Added or deleted any medications: No Patient Arrived: Cane Any new allergies or adverse reactions: No Arrival Time: 10:16 Had a fall or experienced change in No Accompanied By: spouse activities of daily living that may affect Transfer Assistance: None risk of falls: Patient Identification Verified: Yes Signs or symptoms of abuse/neglect since last No Secondary Verification Process Completed: Yes visito Patient Requires Transmission-Based No Hospitalized since last visit: No Precautions: Pain Present Now: No Patient Has Alerts: No Electronic Signature(s) Signed: 06/01/2015 5:07:25 PM By: Montey Hora Entered By: Montey Hora on 06/01/2015 10:17:31 [name] ([id number]) -------------------------------------------------------------------------------- Encounter Discharge Information Details Patient Name: [name] Date of Service: 06/01/2015 10:15 AM Medical Record Number: [id number] Patient Account Number: [id number] Date of Birth/Sex: 1945/07/01 (70 y.o. Female) Treating RN: Montey Hora Primary Care Physician: Ronette Deter Other Clinician: Referring Physician: Ronette Deter Treating Physician/Extender: Frann Rider in Treatment: 1 Encounter Discharge Information Items Discharge Pain Level: 0 Discharge Condition: Stable Ambulatory Status: Ambulatory Discharge Destination: Home Transportation: Private Auto Accompanied By: spouse Schedule  Follow-up Appointment: Yes Medication Reconciliation completed and provided to Patient/Care No Columbia Pandey: Provided on Clinical Summary of Care: 06/01/2015 Form Type Recipient Paper Patient SR Electronic Signature(s) Signed: 06/01/2015 10:52:47 AM By: Montey Hora Previous Signature: 06/01/2015 10:44:15 AM Version By: Ruthine Dose Entered By: Montey Hora on 06/01/2015 10:52:47 [name] ([id number]) -------------------------------------------------------------------------------- Multi Wound Chart Details Patient Name: [name] Date of Service: 06/01/2015 10:15 AM Medical Record Number: [id number] Patient Account Number: [id number] Date of Birth/Sex: [birth year] (69 y.o. Female) Treating RN: Montey Hora Primary Care Physician: Ronette Deter Other Clinician: Referring Physician: Ronette Deter Treating Physician/Extender: Frann Rider in Treatment: 1 Vital Signs Height(in): 63 Pulse(bpm): 75 Weight(lbs): 145 Blood Pressure 119/43 (mmHg): Body Mass Index(BMI): 26 Temperature(F): 98.4 Respiratory Rate 16 (breaths/min): Photos: [1:No Photos] [N/A:N/A] Wound Location: [1:Right Gluteus] [N/A:N/A] Wounding Event: [1:Pressure Injury] [N/A:N/A] Primary Etiology: [1:Pressure Ulcer] [N/A:N/A] Comorbid History: [1:Cataracts, Type I Diabetes, Rheumatoid Arthritis, Osteoarthritis, Neuropathy, Received Chemotherapy] [N/A:N/A] Date Acquired: [1:05/25/2015] [N/A:N/A] Weeks of Treatment: [1:1] [N/A:N/A] Wound Status: [1:Open] [N/A:N/A] Measurements L x W x D 0.7x1x0.3 [N/A:N/A] (cm) Area (cm) : [1:0.55] [N/A:N/A] Volume (cm) : [1:0.165] [N/A:N/A] % Reduction in Area: [1:29.90%] [N/A:N/A] % Reduction in Volume: 47.50% [N/A:N/A] Classification: [1:Category/Stage III] [N/A:N/A] Exudate Amount: [1:Medium] [N/A:N/A] Exudate Type: [1:Serosanguineous] [N/A:N/A] Exudate Color: [1:red, brown] [N/A:N/A] Wound Margin: [1:Flat and Intact]  [N/A:N/A] Granulation Amount: [1:Medium (34-66%)] [N/A:N/A] Granulation Quality: [1:Pink] [N/A:N/A] Necrotic Amount: [1:Medium (34-66%)] [N/A:N/A] Exposed Structures: [1:Fascia: No Fat: No Tendon: No Muscle: No] [N/A:N/A] Joint: No Bone: No Limited to Skin Breakdown Epithelialization: None N/A N/A Periwound Skin Texture: Edema: No N/A N/A Excoriation: No Induration: No Callus: No Crepitus: No Fluctuance: No Friable: No Rash: No Scarring: No Periwound Skin Moist: Yes N/A N/A Moisture: Maceration: No Dry/Scaly: No Periwound Skin Color: Atrophie Blanche: No N/A N/A Cyanosis: No Ecchymosis: No Erythema: No Hemosiderin Staining: No Mottled: No Pallor: No Rubor: No Temperature: No Abnormality N/A N/A Tenderness on Yes N/A N/A Palpation: Wound Preparation: Ulcer Cleansing: N/A N/A Rinsed/Irrigated with Saline Topical Anesthetic Applied: Other: lidocaine 4% Treatment Notes Electronic Signature(s) Signed: 06/01/2015  5:07:25 PM By: Montey Hora Entered By: Montey Hora on 06/01/2015 10:24:32 [name] ([id number]) -------------------------------------------------------------------------------- Multi-Disciplinary Care Plan Details Patient Name: [name] Date of Service: 06/01/2015 10:15 AM Medical Record Number: [id number] Patient Account Number: [id number] Date of Birth/Sex: 1945/07/24 (69 y.o. Female) Treating RN: Montey Hora Primary Care Physician: Ronette Deter Other Clinician: Referring Physician: Ronette Deter Treating Physician/Extender: Frann Rider in Treatment: 1 Active Inactive Orientation to the Wound Care Program Nursing Diagnoses: Knowledge deficit related to the wound healing center program Goals: Patient/caregiver will verbalize understanding of the [facility] Date Initiated: 05/25/2015 Goal Status: Active Interventions: Provide education on orientation to the wound  center Notes: Pressure Nursing Diagnoses: Knowledge deficit related to causes and risk factors for pressure ulcer development Knowledge deficit related to management of pressures ulcers Potential for impaired tissue integrity related to pressure, friction, moisture, and shear Goals: Patient will remain free from development of additional pressure ulcers Date Initiated: 05/25/2015 Goal Status: Active Patient will remain free of pressure ulcers Date Initiated: 05/25/2015 Goal Status: Active Patient/caregiver will verbalize risk factors for pressure ulcer development Date Initiated: 05/25/2015 Goal Status: Active Patient/caregiver will verbalize understanding of pressure ulcer management Date Initiated: 05/25/2015 Goal Status: Active Interventions: [name], [name] ([id number]) Assess: immobility, friction, shearing, incontinence upon admission and as needed Assess offloading mechanisms upon admission and as needed Assess potential for pressure ulcer upon admission and as needed Provide education on pressure ulcers Treatment Activities: Patient referred for home evaluation of offloading devices/mattresses : 06/01/2015 Patient referred for seating evaluation to ensure proper offloading : 06/01/2015 Pressure reduction/relief device ordered : 06/01/2015 Notes: Wound/Skin Impairment Nursing Diagnoses: Impaired tissue integrity Knowledge deficit related to ulceration/compromised skin integrity Goals: Patient/caregiver will verbalize understanding of skin care regimen Date Initiated: 05/25/2015 Goal Status: Active Ulcer/skin breakdown will have a volume reduction of 30% by week 4 Date Initiated: 05/25/2015 Goal Status: Active Ulcer/skin breakdown will have a volume reduction of 50% by week 8 Date Initiated: 05/25/2015 Goal Status: Active Ulcer/skin breakdown will have a volume reduction of 80% by week 12 Date Initiated: 05/25/2015 Goal Status: Active Ulcer/skin breakdown will heal within  14 weeks Date Initiated: 05/25/2015 Goal Status: Active Interventions: Assess patient/caregiver ability to obtain necessary supplies Assess patient/caregiver ability to perform ulcer/skin care regimen upon admission and as needed Assess ulceration(s) every visit Provide education on ulcer and skin care Treatment Activities: Patient referred to home care : 06/01/2015 Referred to DME Mal Asher for dressing supplies : 06/01/2015 Topical wound management initiated : 06/01/2015 [name], [name] ([id number]) Notes: Electronic Signature(s) Signed: 06/01/2015 5:07:25 PM By: Montey Hora Entered By: Montey Hora on 06/01/2015 10:24:26 [name] ([id number]) -------------------------------------------------------------------------------- Patient/Caregiver Education Details Patient Name: [name] Date of Service: 06/01/2015 10:15 AM Medical Record Number: [id number] Patient Account Number: [id number] Date of Birth/Gender: 1945-11-28 (70 y.o. Female) Treating RN: Montey Hora Primary Care Physician: Ronette Deter Other Clinician: Referring Physician: Ronette Deter Treating Physician/Extender: Frann Rider in Treatment: 1 Education Assessment Education Provided To: Patient Education Topics Provided Wound/Skin Impairment: Handouts: Other: wound care to continue as ordered Methods: Demonstration, Explain/Verbal Responses: State content correctly Electronic Signature(s) Signed: 06/01/2015 10:54:35 AM By: Montey Hora Entered By: Montey Hora on 06/01/2015 10:54:35 [name] ([id number]) -------------------------------------------------------------------------------- Wound Assessment Details Patient Name: [name] Date of Service: 06/01/2015 10:15 AM Medical Record Number: [id number] Patient Account Number: [id number] Date of Birth/Sex: [birth year] (70 y.o. Female) Treating RN: Montey Hora Primary Care Physician: Ronette Deter Other  Clinician: Referring Physician:  Ronette Deter Treating Physician/Extender: Frann Rider in Treatment: 1 Wound Status Wound Number: 1 Primary Pressure Ulcer Etiology: Wound Location: Right Gluteus Wound Open Wounding Event: Pressure Injury Status: Date Acquired: 05/25/2015 Comorbid Cataracts, Type I Diabetes, Rheumatoid Weeks Of Treatment: 1 History: Arthritis, Osteoarthritis, Neuropathy, Clustered Wound: No Received Chemotherapy Photos Photo Uploaded By: Montey Hora on 06/01/2015 16:44:19 Wound Measurements Length: (cm) 0.7 Width: (cm) 1 Depth: (cm) 0.3 Area: (cm) 0.55 Volume: (cm) 0.165 % Reduction in Area: 29.9% % Reduction in Volume: 47.5% Epithelialization: None Tunneling: No Undermining: No Wound Description Classification: Category/Stage III Wound Margin: Flat and Intact Exudate Amount: Medium Exudate Type: Serosanguineous Exudate Color: red, brown Foul Odor After Cleansing: No Wound Bed Granulation Amount: Medium (34-66%) Exposed Structure Granulation Quality: Pink Fascia Exposed: No Necrotic Amount: Medium (34-66%) Fat Layer Exposed: No Necrotic Quality: Adherent Slough Tendon Exposed: No [name], [name]. ([id number]) Muscle Exposed: No Joint Exposed: No Bone Exposed: No Limited to Skin Breakdown Periwound Skin Texture Texture Color No Abnormalities Noted: No No Abnormalities Noted: No Callus: No Atrophie Blanche: No Crepitus: No Cyanosis: No Excoriation: No Ecchymosis: No Fluctuance: No Erythema: No Friable: No Hemosiderin Staining: No Induration: No Mottled: No Localized Edema: No Pallor: No Rash: No Rubor: No Scarring: No Temperature / Pain Moisture Temperature: No Abnormality No Abnormalities Noted: No Tenderness on Palpation: Yes Dry / Scaly: No Maceration: No Moist: Yes Wound Preparation Ulcer Cleansing: Rinsed/Irrigated with Saline Topical Anesthetic Applied: Other: lidocaine 4%, Treatment Notes Wound #1  (Right Gluteus) 1. Cleansed with: Clean wound with Normal Saline 2. Anesthetic Topical Lidocaine 4% cream to wound bed prior to debridement 4. Dressing Applied: Aquacel Ag 5. Secondary [place] Signature(s) Signed: 06/01/2015 5:07:25 PM By: Montey Hora Entered By: Montey Hora on 06/01/2015 10:24:12 [name] ([id number]) -------------------------------------------------------------------------------- Vitals Details Patient Name: [name] Date of Service: 06/01/2015 10:15 AM Medical Record Number: [id number] Patient Account Number: [id number] Date of Birth/Sex: 05-02-1946 (69 y.o. Female) Treating RN: Montey Hora Primary Care Physician: Ronette Deter Other Clinician: Referring Physician: Ronette Deter Treating Physician/Extender: Frann Rider in Treatment: 1 Vital Signs Time Taken: 10:17 Temperature (F): 98.4 Height (in): 63 Pulse (bpm): 75 Weight (lbs): 145 Respiratory Rate (breaths/min): 16 Body Mass Index (BMI): 25.7 Blood Pressure (mmHg): 119/43 Reference Range: 80 - 120 mg / dl Electronic Signature(s) Signed: 06/01/2015 5:07:25 PM By: Montey Hora Entered By: Montey Hora on 06/01/2015 10:18:52

## 2015-06-02 NOTE — Progress Notes (Signed)
Janet, [name] ([id number]) Visit Report for 06/01/2015 Chief Complaint Document Details Patient Name: [name], [name] 06/01/2015 10:15 Date of Service: AM Medical Record [id number] Number: Patient Account Number: [id number] 02-17-1946 (70 y.o. Treating RN: Montey Hora Date of Birth/Sex: Female) Other Clinician: Primary Care Physician: Ronette Deter Treating Christin Fudge Referring Physician: Ronette Deter Physician/Extender: Suella Grove in Treatment: 1 Information Obtained from: Patient Chief Complaint Patient is at the clinic for treatment of an open pressure ulcer to the left gluteal region for about 3 weeks now. Electronic Signature(s) Signed: 06/01/2015 10:43:01 AM By: Christin Fudge MD, FACS Entered By: Christin Fudge on 06/01/2015 10:43:01 [name] ([id number]) -------------------------------------------------------------------------------- Debridement Details Patient Name: Janet, [name] 06/01/2015 10:15 Date of Service: AM Medical Record [id number] Number: Patient Account Number: [id number] July 04, 1945 (70 y.o. Treating RN: Montey Hora Date of Birth/Sex: Female) Other Clinician: Primary Care Physician: Ronette Deter Treating Christin Fudge Referring Physician: Ronette Deter Physician/Extender: Suella Grove in Treatment: 1 Debridement Performed for Wound #1 Right Gluteus Assessment: Performed By: Physician Christin Fudge, MD Debridement: Debridement Pre-procedure Yes Verification/Time Out Taken: Start Time: 10:33 Pain Control: Lidocaine 4% Topical Solution Level: Skin/Subcutaneous Tissue Total Area Debrided (L x 0.7 (cm) x 0.1 (cm) = 0.07 (cm) W): Tissue and other Viable, Non-Viable, Eschar, Fibrin/Slough, Subcutaneous material debrided: Instrument: Curette, Forceps, Scissors Bleeding: Minimum Hemostasis Achieved: Pressure End Time: 10:35 Procedural Pain: 0 Post Procedural Pain: 0 Response to Treatment: Procedure was tolerated well Post  Debridement Measurements of Total Wound Length: (cm) 0.7 Stage: Category/Stage III Width: (cm) 1 Depth: (cm) 0.3 Volume: (cm) 0.165 Post Procedure Diagnosis Same as Pre-procedure Electronic Signature(s) Signed: 06/01/2015 10:42:53 AM By: Christin Fudge MD, FACS Signed: 06/01/2015 5:07:25 PM By: Montey Hora Entered By: Christin Fudge on 06/01/2015 10:42:53 [name] ([id number]) -------------------------------------------------------------------------------- HPI Details Patient Name: Janet, [name] 06/01/2015 10:15 Date of Service: AM Medical Record [id number] Number: Patient Account Number: [id number] October 08, 1945 (70 y.o. Treating RN: Montey Hora Date of Birth/Sex: Female) Other Clinician: Primary Care Physician: Ronette Deter Treating Christin Fudge Referring Physician: Ronette Deter Physician/Extender: Suella Grove in Treatment: 1 History of Present Illness Location: ulcerated on the right gluteal region just to the right of the midline Quality: Patient reports experiencing a dull pain to affected area(s). Severity: Patient states wound are getting worse. Duration: Patient has had the wound for < 3 weeks prior to presenting for treatment Timing: Pain in wound is Intermittent (comes and goes Context: The wound appeared gradually over time Modifying Factors: Consults to this date include: local care was done as per the medication given by the PCP which may have been medihoney Associated Signs and Symptoms: Patient reports having difficulty standing for long periods. 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 Susy Manor. he was referred to [place] for a decubitus ulcer on her right gluteal area. she is known to have diabetes mellitus type 1 and her most recent A1c was 7.7% Past medical history significant for leukopenia, cholelithiasis, hypertension,  chronic diastolic CHF, coronary artery disease,type 1 diabetes mellitus, rheumatoid arthritis, collagen vascular disease, ovarian cancer, status post CABG in 2014 and status post Port-A-Cath insertion by Dr. Leotis Pain in October 2016. She has quit smoking about 20 years ago. She was advised to use Medihoney with calcium alginate pads to be applied over the wound. Electronic Signature(s) Signed: 06/01/2015 10:44:56 AM By: Christin Fudge MD, FACS Entered By: Christin Fudge on 06/01/2015 10:44:56 [name] ([id number]) --------------------------------------------------------------------------------  Physical Exam Details Patient Name: Janet, [name] 06/01/2015 10:15 Date of Service: AM Medical Record [id number] Number: Patient Account Number: [id number] 14-Oct-1945 (70 y.o. Treating RN: Montey Hora Date of Birth/Sex: Female) Other Clinician: Primary Care Physician: Ronette Deter Treating Christin Fudge Referring Physician: Ronette Deter Physician/Extender: Weeks in Treatment: 1 Constitutional . Pulse regular. Respirations normal and unlabored. Afebrile. . Eyes Nonicteric. Reactive to light. Ears, Nose, Mouth, and Throat Lips, teeth, and gums WNL.Marland Kitchen Moist mucosa without lesions. Neck supple and nontender. No palpable supraclavicular or cervical adenopathy. Normal sized without goiter. Respiratory WNL. No retractions.. Breath sounds WNL, No rubs, rales, rhonchi, or wheeze.. Cardiovascular Heart rhythm and rate regular, no murmur or gallop.. Pedal Pulses WNL. No clubbing, cyanosis or edema. Chest Breasts symmetical and no nipple discharge.. Breast tissue WNL, no masses, lumps, or tenderness.. Lymphatic No adneopathy. No adenopathy. No adenopathy. Musculoskeletal Adexa without tenderness or enlargement.. Digits and nails w/o clubbing, cyanosis, infection, petechiae, ischemia, or inflammatory conditions.. Integumentary (Hair, Skin) No suspicious lesions. No crepitus or  fluctuance. No peri-wound warmth or erythema. No masses.Marland Kitchen Psychiatric Judgement and insight Intact.. No evidence of depression, anxiety, or agitation.. Notes the wound on the right gluteal area has some slough which was sharply debrided in the subcutaneous region with a curette and forceps and scissors were also used to sharply dissected some of the necrotic debris. Brisk bleeding was controlled with pressure Electronic Signature(s) Signed: 06/01/2015 10:45:33 AM By: Christin Fudge MD, FACS Entered By: Christin Fudge on 06/01/2015 10:45:32 [name] ([id number]) -------------------------------------------------------------------------------- Physician Orders Details Patient Name: YLEANA, BOLA 06/01/2015 10:15 Date of Service: AM Medical Record [id number] Number: Patient Account Number: [id number] August 05, 1945 (70 y.o. Treating RN: Montey Hora Date of Birth/Sex: Female) Other Clinician: Primary Care Physician: Ronette Deter Treating Christin Fudge Referring Physician: Ronette Deter Physician/Extender: Suella Grove in Treatment: 1 Verbal / Phone Orders: Yes Clinician: Montey Hora Read Back and Verified: Yes Diagnosis Coding Wound Cleansing Wound #1 Right Gluteus o Cleanse wound with mild soap and water o May Shower, gently pat wound dry prior to applying new dressing. Anesthetic Wound #1 Right Gluteus o Topical Lidocaine 4% cream applied to wound bed prior to debridement Primary Wound Dressing Wound #1 Right Gluteus o Aquacel Ag Secondary Dressing Wound #1 Right Gluteus o Non-adherent pad Dressing Change Frequency Wound #1 Right Gluteus o Change dressing every other day. Follow-up Appointments Wound #1 Right Gluteus o Return Appointment in 1 week. Off-Loading Wound #1 Right Gluteus o Turn and reposition every 2 hours o Mattress Additional Orders / Instructions Wound #1 Right Gluteus o Increase protein intake. RHEVA, VENTRICE  ([id number]) o Activity as tolerated o Other: - Include Zinc, MVI and Vit C daily. Electronic Signature(s) Signed: 06/01/2015 4:26:31 PM By: Christin Fudge MD, FACS Signed: 06/01/2015 5:07:25 PM By: Montey Hora Entered By: Montey Hora on 06/01/2015 10:33:01 [name] ([id number]) -------------------------------------------------------------------------------- Problem List Details Patient Name: ELWOOD, KAHANA 06/01/2015 10:15 Date of Service: AM Medical Record [id number] Number: Patient Account Number: [id number] February 23, 1946 (70 y.o. Treating RN: Montey Hora Date of Birth/Sex: Female) Other Clinician: Primary Care Physician: Ronette Deter Treating Christin Fudge Referring Physician: Ronette Deter Physician/Extender: Suella Grove in Treatment: 1 Active Problems ICD-10 Encounter Code Description Active Date Diagnosis E10.622 Type 1 diabetes mellitus with other skin ulcer 05/25/2015 Yes L89.313 Pressure ulcer of right buttock, stage 3 05/25/2015 Yes Z92.21 Personal history of antineoplastic chemotherapy 05/25/2015 Yes Inactive Problems Resolved Problems Electronic Signature(s) Signed: 06/01/2015 10:42:01 AM By: Christin Fudge MD, FACS Entered By: Christin Fudge  on 06/01/2015 10:42:01 SAPHIRA, MESSANA ([id number]) -------------------------------------------------------------------------------- Progress Note Details Patient Name: EVEREST, LOO 06/01/2015 10:15 Date of Service: AM Medical Record [id number] Number: Patient Account Number: [id number] December 17, 1945 (70 y.o. Treating RN: Montey Hora Date of Birth/Sex: Female) Other Clinician: Primary Care Physician: Ronette Deter Treating Christin Fudge Referring Physician: Ronette Deter Physician/Extender: Suella Grove in Treatment: 1 Subjective Chief Complaint Information obtained from Patient Patient is at the clinic for treatment of an open pressure ulcer to the left gluteal region for about 3  weeks now. History of Present Illness (HPI) The following HPI elements were documented for the patient's wound: Location: ulcerated on the right gluteal region just to the right of the midline Quality: Patient reports experiencing a dull pain to affected area(s). Severity: Patient states wound are getting worse. Duration: Patient has had the wound for < 3 weeks prior to presenting for treatment Timing: Pain in wound is Intermittent (comes and goes Context: The wound appeared gradually over time Modifying Factors: Consults to this date include: local care was done as per the medication given by the PCP which may have been medihoney Associated Signs and Symptoms: Patient reports having difficulty standing for long periods. 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 Susy Manor. he was referred to [place] for a decubitus ulcer on her right gluteal area. she is known to have diabetes mellitus type 1 and her most recent A1c was 7.7% Past medical history significant for leukopenia, cholelithiasis, hypertension, chronic diastolic CHF, coronary artery disease,type 1 diabetes mellitus, rheumatoid arthritis, collagen vascular disease, ovarian cancer, status post CABG in 2014 and status post Port-A-Cath insertion by Dr. Leotis Pain in October 2016. She has quit smoking about 20 years ago. She was advised to use Medihoney with calcium alginate pads to be applied over the wound. Objective Constitutional Pulse regular. Respirations normal and unlabored. Afebrile. LADEJAH, SULA ([id number]) Vitals Time Taken: 10:17 AM, Height: 63 in, Weight: 145 lbs, BMI: 25.7, Temperature: 98.4 F, Pulse: 75 bpm, Respiratory Rate: 16 breaths/min, Blood Pressure: 119/43 mmHg. Eyes Nonicteric. Reactive to light. Ears, Nose, Mouth, and Throat Lips, teeth, and gums WNL.Marland Kitchen Moist mucosa without lesions. Neck supple and  nontender. No palpable supraclavicular or cervical adenopathy. Normal sized without goiter. Respiratory WNL. No retractions.. Breath sounds WNL, No rubs, rales, rhonchi, or wheeze.. Cardiovascular Heart rhythm and rate regular, no murmur or gallop.. Pedal Pulses WNL. No clubbing, cyanosis or edema. Chest Breasts symmetical and no nipple discharge.. Breast tissue WNL, no masses, lumps, or tenderness.. Lymphatic No adneopathy. No adenopathy. No adenopathy. Musculoskeletal Adexa without tenderness or enlargement.. Digits and nails w/o clubbing, cyanosis, infection, petechiae, ischemia, or inflammatory conditions.Marland Kitchen Psychiatric Judgement and insight Intact.. No evidence of depression, anxiety, or agitation.. General Notes: the wound on the right gluteal area has some slough which was sharply debrided in the subcutaneous region with a curette and forceps and scissors were also used to sharply dissected some of the necrotic debris. Brisk bleeding was controlled with pressure Integumentary (Hair, Skin) No suspicious lesions. No crepitus or fluctuance. No peri-wound warmth or erythema. No masses.. Wound #1 status is Open. Original cause of wound was Pressure Injury. The wound is located on the Right Gluteus. The wound measures 0.7cm length x 1cm width x 0.3cm depth; 0.55cm^2 area and 0.165cm^3 volume. The wound is limited to skin breakdown. There is no tunneling or undermining noted. There is a medium amount of serosanguineous drainage noted. The wound margin  is flat and intact. There is medium (34-66%) pink granulation within the wound bed. There is a medium (34-66%) amount of necrotic tissue within the wound bed including Adherent Slough. The periwound skin appearance exhibited: Moist. The periwound skin appearance did not exhibit: Callus, Crepitus, Excoriation, Fluctuance, Friable, Induration, Localized Edema, Rash, Scarring, Dry/Scaly, Maceration, Atrophie Blanche, Cyanosis,  Ecchymosis, Hemosiderin Staining, Mottled, Pallor, Rubor, Erythema. Periwound temperature was noted as No VAL, TIBERI. ([id number]) Abnormality. The periwound has tenderness on palpation. Assessment Active Problems ICD-10 E10.622 - Type 1 diabetes mellitus with other skin ulcer L89.313 - Pressure ulcer of right buttock, stage 3 Z92.21 - Personal history of antineoplastic chemotherapy Procedures Wound #1 Wound #1 is a Pressure Ulcer located on the Right Gluteus . There was a Skin/Subcutaneous Tissue Debridement [id number]) debridement with total area of 0.07 sq cm performed by Christin Fudge, MD. with the following instrument(s): Curette, Forceps, and Scissors to remove Viable and Non-Viable tissue/material including Fibrin/Slough, Eschar, and Subcutaneous after achieving pain control using Lidocaine 4% Topical Solution. A time out was conducted prior to the start of the procedure. A Minimum amount of bleeding was controlled with Pressure. The procedure was tolerated well with a pain level of 0 throughout and a pain level of 0 following the procedure. Post Debridement Measurements: 0.7cm length x 1cm width x 0.3cm depth; 0.165cm^3 volume. Post debridement Stage noted as Category/Stage III. Post procedure Diagnosis Wound #1: Same as Pre-Procedure Plan Wound Cleansing: Wound #1 Right Gluteus: Cleanse wound with mild soap and water May Shower, gently pat wound dry prior to applying new dressing. Anesthetic: Wound #1 Right Gluteus: Topical Lidocaine 4% cream applied to wound bed prior to debridement Primary Wound Dressing: Wound #1 Right Gluteus: Aquacel Ag [name] ([id number]) Secondary Dressing: Wound #1 Right Gluteus: Non-adherent pad Dressing Change Frequency: Wound #1 Right Gluteus: Change dressing every other day. Follow-up Appointments: Wound #1 Right Gluteus: Return Appointment in 1 week. Off-Loading: Wound #1 Right Gluteus: Turn and reposition every 2  hours Mattress Additional Orders / Instructions: Wound #1 Right Gluteus: Increase protein intake. Activity as tolerated Other: - Include Zinc, MVI and Vit C daily. After debriding her wound today I have recommended: 1. Offloading as much as possible and have discussed details of this. 2. Packing the wound with Aquacel Ag and applying inappropriate bordered foam dressing. 3. Appropriate air cushion for her chair as required. 4. vitamin supplements including vitamin C and zinc -- they will get this from the pharmacy 5. adequate protein intake 6. Regular visits to the wound care center The patient and her husband have had all questions answered and will be seeing me next week. Electronic Signature(s) Signed: 06/01/2015 10:49:06 AM By: Christin Fudge MD, FACS Entered By: Christin Fudge on 06/01/2015 10:49:06 [name] ([id number]) -------------------------------------------------------------------------------- SuperBill Details Patient Name: [name] Date of Service: 06/01/2015 Medical Record Number: [id number] Patient Account Number: [id number] Date of Birth/Sex: 1945-05-21 (69 y.o. Female) Treating RN: Montey Hora Primary Care Physician: Ronette Deter Other Clinician: Referring Physician: Ronette Deter Treating Physician/Extender: Frann Rider in Treatment: 1 Diagnosis Coding ICD-10 Codes Code Description [phone] Type 1 diabetes mellitus with other skin ulcer L89.313 Pressure ulcer of right buttock, stage 3 Z92.21 Personal history of antineoplastic chemotherapy Facility Procedures CPT4 Code: [id number] Description: [id number] - DEB SUBQ TISSUE 20 SQ CM/< ICD-10 Description Diagnosis E10.622 Type 1 diabetes mellitus with other skin ulcer L89.313 Pressure ulcer of right buttock, stage 3 Z92.21 Personal history of antineoplastic chemotherap Modifier: y Quantity: 1 Physician  Procedures CPT4 Code: [id number] Description: [id number] - WC PHYS SUBQ TISS 20 SQ CM ICD-10  Description Diagnosis E10.622 Type 1 diabetes mellitus with other skin ulcer L89.313 Pressure ulcer of right buttock, stage 3 Z92.21 Personal history of antineoplastic chemotherap Modifier: y Quantity: 1 Electronic Signature(s) Signed: 06/01/2015 10:49:16 AM By: Christin Fudge MD, FACS Entered By: Christin Fudge on 06/01/2015 10:49:15

## 2015-06-03 ENCOUNTER — Other Ambulatory Visit: Payer: Self-pay | Admitting: Hematology and Oncology

## 2015-06-03 ENCOUNTER — Telehealth: Payer: Self-pay

## 2015-06-03 ENCOUNTER — Telehealth: Payer: Self-pay | Admitting: Hematology and Oncology

## 2015-06-03 ENCOUNTER — Inpatient Hospital Stay: Payer: Medicare Other | Attending: Hematology and Oncology

## 2015-06-03 ENCOUNTER — Inpatient Hospital Stay: Payer: Medicare Other

## 2015-06-03 DIAGNOSIS — Z87891 Personal history of nicotine dependence: Secondary | ICD-10-CM | POA: Insufficient documentation

## 2015-06-03 DIAGNOSIS — Z794 Long term (current) use of insulin: Secondary | ICD-10-CM | POA: Diagnosis not present

## 2015-06-03 DIAGNOSIS — D649 Anemia, unspecified: Secondary | ICD-10-CM | POA: Insufficient documentation

## 2015-06-03 DIAGNOSIS — C562 Malignant neoplasm of left ovary: Secondary | ICD-10-CM | POA: Insufficient documentation

## 2015-06-03 DIAGNOSIS — C561 Malignant neoplasm of right ovary: Secondary | ICD-10-CM | POA: Insufficient documentation

## 2015-06-03 DIAGNOSIS — Z862 Personal history of diseases of the blood and blood-forming organs and certain disorders involving the immune mechanism: Secondary | ICD-10-CM | POA: Insufficient documentation

## 2015-06-03 DIAGNOSIS — E785 Hyperlipidemia, unspecified: Secondary | ICD-10-CM | POA: Diagnosis not present

## 2015-06-03 DIAGNOSIS — E876 Hypokalemia: Secondary | ICD-10-CM | POA: Insufficient documentation

## 2015-06-03 DIAGNOSIS — I252 Old myocardial infarction: Secondary | ICD-10-CM | POA: Insufficient documentation

## 2015-06-03 DIAGNOSIS — I5032 Chronic diastolic (congestive) heart failure: Secondary | ICD-10-CM | POA: Diagnosis not present

## 2015-06-03 DIAGNOSIS — G629 Polyneuropathy, unspecified: Secondary | ICD-10-CM | POA: Insufficient documentation

## 2015-06-03 DIAGNOSIS — I10 Essential (primary) hypertension: Secondary | ICD-10-CM | POA: Diagnosis not present

## 2015-06-03 DIAGNOSIS — I2510 Atherosclerotic heart disease of native coronary artery without angina pectoris: Secondary | ICD-10-CM | POA: Diagnosis not present

## 2015-06-03 DIAGNOSIS — E109 Type 1 diabetes mellitus without complications: Secondary | ICD-10-CM | POA: Insufficient documentation

## 2015-06-03 DIAGNOSIS — L8990 Pressure ulcer of unspecified site, unspecified stage: Secondary | ICD-10-CM | POA: Insufficient documentation

## 2015-06-03 DIAGNOSIS — Z79899 Other long term (current) drug therapy: Secondary | ICD-10-CM | POA: Diagnosis not present

## 2015-06-03 DIAGNOSIS — C786 Secondary malignant neoplasm of retroperitoneum and peritoneum: Secondary | ICD-10-CM | POA: Insufficient documentation

## 2015-06-03 DIAGNOSIS — Z9221 Personal history of antineoplastic chemotherapy: Secondary | ICD-10-CM | POA: Diagnosis not present

## 2015-06-03 DIAGNOSIS — I998 Other disorder of circulatory system: Secondary | ICD-10-CM | POA: Insufficient documentation

## 2015-06-03 DIAGNOSIS — M069 Rheumatoid arthritis, unspecified: Secondary | ICD-10-CM | POA: Diagnosis not present

## 2015-06-03 DIAGNOSIS — Z7982 Long term (current) use of aspirin: Secondary | ICD-10-CM | POA: Diagnosis not present

## 2015-06-03 DIAGNOSIS — Z8701 Personal history of pneumonia (recurrent): Secondary | ICD-10-CM | POA: Insufficient documentation

## 2015-06-03 DIAGNOSIS — K8020 Calculus of gallbladder without cholecystitis without obstruction: Secondary | ICD-10-CM | POA: Insufficient documentation

## 2015-06-03 DIAGNOSIS — Z85830 Personal history of malignant neoplasm of bone: Secondary | ICD-10-CM | POA: Diagnosis not present

## 2015-06-03 DIAGNOSIS — K449 Diaphragmatic hernia without obstruction or gangrene: Secondary | ICD-10-CM | POA: Diagnosis not present

## 2015-06-03 DIAGNOSIS — K219 Gastro-esophageal reflux disease without esophagitis: Secondary | ICD-10-CM | POA: Diagnosis not present

## 2015-06-03 LAB — CBC
HCT: 27.5 % — ABNORMAL LOW (ref 35.0–47.0)
Hemoglobin: 9.3 g/dL — ABNORMAL LOW (ref 12.0–16.0)
MCH: 29.6 pg (ref 26.0–34.0)
MCHC: 34 g/dL (ref 32.0–36.0)
MCV: 87 fL (ref 80.0–100.0)
Platelets: 160 10*3/uL (ref 150–440)
RBC: 3.16 MIL/uL — ABNORMAL LOW (ref 3.80–5.20)
RDW: 19.2 % — ABNORMAL HIGH (ref 11.5–14.5)
WBC: 14.1 10*3/uL — ABNORMAL HIGH (ref 3.6–11.0)

## 2015-06-03 LAB — MAGNESIUM: Magnesium: 1.2 mg/dL — ABNORMAL LOW (ref 1.7–2.4)

## 2015-06-03 LAB — POTASSIUM: Potassium: 3.8 mmol/L (ref 3.5–5.1)

## 2015-06-03 NOTE — Telephone Encounter (Signed)
  Oncology Nurse Navigator Documentation  Navigator Location: [facility] (06/03/15 1600) Navigator Encounter Type: Telephone (06/03/15 1600)                                          Time Spent with Patient: 15 (06/03/15 1600)   [name] at [facility] left voicemail regarding patient needing preauthorization for appts and surgery since [facility] is out of her network. Services she needs cannot be obtained at [facility].

## 2015-06-03 NOTE — Telephone Encounter (Signed)
Re:  low magnesium  Called patient about low magnesium on today's labs.  Discussed oral versus IV magnesium.  She would like to come in for IV magnesium.  Scheduling notified.  Lequita Asal, MD

## 2015-06-05 ENCOUNTER — Inpatient Hospital Stay: Payer: Medicare Other

## 2015-06-05 ENCOUNTER — Ambulatory Visit: Payer: Medicare Other | Admitting: Hematology and Oncology

## 2015-06-05 ENCOUNTER — Inpatient Hospital Stay (HOSPITAL_BASED_OUTPATIENT_CLINIC_OR_DEPARTMENT_OTHER): Payer: Medicare Other | Admitting: Hematology and Oncology

## 2015-06-05 ENCOUNTER — Other Ambulatory Visit: Payer: Self-pay

## 2015-06-05 ENCOUNTER — Encounter: Payer: Self-pay | Admitting: Hematology and Oncology

## 2015-06-05 VITALS — BP 125/72 | HR 75 | Temp 97.8°F | Resp 18 | Ht 63.0 in | Wt 141.8 lb

## 2015-06-05 DIAGNOSIS — Z862 Personal history of diseases of the blood and blood-forming organs and certain disorders involving the immune mechanism: Secondary | ICD-10-CM

## 2015-06-05 DIAGNOSIS — I252 Old myocardial infarction: Secondary | ICD-10-CM

## 2015-06-05 DIAGNOSIS — Z9221 Personal history of antineoplastic chemotherapy: Secondary | ICD-10-CM

## 2015-06-05 DIAGNOSIS — C786 Secondary malignant neoplasm of retroperitoneum and peritoneum: Secondary | ICD-10-CM

## 2015-06-05 DIAGNOSIS — D649 Anemia, unspecified: Secondary | ICD-10-CM

## 2015-06-05 DIAGNOSIS — C562 Malignant neoplasm of left ovary: Secondary | ICD-10-CM

## 2015-06-05 DIAGNOSIS — Z7982 Long term (current) use of aspirin: Secondary | ICD-10-CM

## 2015-06-05 DIAGNOSIS — Z85830 Personal history of malignant neoplasm of bone: Secondary | ICD-10-CM

## 2015-06-05 DIAGNOSIS — Z794 Long term (current) use of insulin: Secondary | ICD-10-CM

## 2015-06-05 DIAGNOSIS — K219 Gastro-esophageal reflux disease without esophagitis: Secondary | ICD-10-CM

## 2015-06-05 DIAGNOSIS — Z87891 Personal history of nicotine dependence: Secondary | ICD-10-CM

## 2015-06-05 DIAGNOSIS — C569 Malignant neoplasm of unspecified ovary: Secondary | ICD-10-CM

## 2015-06-05 DIAGNOSIS — E109 Type 1 diabetes mellitus without complications: Secondary | ICD-10-CM

## 2015-06-05 DIAGNOSIS — Z79899 Other long term (current) drug therapy: Secondary | ICD-10-CM

## 2015-06-05 DIAGNOSIS — K449 Diaphragmatic hernia without obstruction or gangrene: Secondary | ICD-10-CM

## 2015-06-05 DIAGNOSIS — K8020 Calculus of gallbladder without cholecystitis without obstruction: Secondary | ICD-10-CM

## 2015-06-05 DIAGNOSIS — C561 Malignant neoplasm of right ovary: Secondary | ICD-10-CM | POA: Diagnosis not present

## 2015-06-05 DIAGNOSIS — Z8701 Personal history of pneumonia (recurrent): Secondary | ICD-10-CM

## 2015-06-05 DIAGNOSIS — G629 Polyneuropathy, unspecified: Secondary | ICD-10-CM

## 2015-06-05 DIAGNOSIS — E785 Hyperlipidemia, unspecified: Secondary | ICD-10-CM

## 2015-06-05 DIAGNOSIS — I2510 Atherosclerotic heart disease of native coronary artery without angina pectoris: Secondary | ICD-10-CM

## 2015-06-05 DIAGNOSIS — I998 Other disorder of circulatory system: Secondary | ICD-10-CM

## 2015-06-05 DIAGNOSIS — I10 Essential (primary) hypertension: Secondary | ICD-10-CM

## 2015-06-05 DIAGNOSIS — L8990 Pressure ulcer of unspecified site, unspecified stage: Secondary | ICD-10-CM

## 2015-06-05 DIAGNOSIS — I5032 Chronic diastolic (congestive) heart failure: Secondary | ICD-10-CM

## 2015-06-05 DIAGNOSIS — E876 Hypokalemia: Secondary | ICD-10-CM

## 2015-06-05 DIAGNOSIS — C801 Malignant (primary) neoplasm, unspecified: Secondary | ICD-10-CM

## 2015-06-05 DIAGNOSIS — M069 Rheumatoid arthritis, unspecified: Secondary | ICD-10-CM

## 2015-06-05 MED ORDER — SODIUM CHLORIDE 0.9% FLUSH
10.0000 mL | Freq: Once | INTRAVENOUS | Status: AC
  Administered 2015-06-05: 10 mL via INTRAVENOUS
  Filled 2015-06-05: qty 10

## 2015-06-05 MED ORDER — SODIUM CHLORIDE 0.9 % IV SOLN
INTRAVENOUS | Status: DC
  Administered 2015-06-05: 11:00:00 via INTRAVENOUS
  Filled 2015-06-05: qty 1000

## 2015-06-05 MED ORDER — MAGNESIUM SULFATE 2 GM/50ML IV SOLN
2.0000 g | Freq: Once | INTRAVENOUS | Status: AC
  Administered 2015-06-05: 2 g via INTRAVENOUS
  Filled 2015-06-05: qty 50

## 2015-06-05 MED ORDER — HEPARIN SOD (PORK) LOCK FLUSH 100 UNIT/ML IV SOLN
500.0000 [IU] | Freq: Once | INTRAVENOUS | Status: AC
  Administered 2015-06-05: 500 [IU] via INTRAVENOUS
  Filled 2015-06-05: qty 5

## 2015-06-05 NOTE — Progress Notes (Addendum)
[facility] day:  06/05/2015   Chief Complaint: [name] is a 70 y.o. 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    HPI:  The patient was last seen in the medical oncology clinic on 05/29/2015.  At that time, she was seen for nadir assessment on day 8 post cycle #4.  Symptomatically, she was fatigued.  Her appetite was poor.  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  She had a 1 cm clean stage II right gluteal fold decubitus ulcer.  She was neutropenic ([place] 300).  Magnesium was low (1.1).  She received GCSF on 05/29/2015.  [place] on 06/01/2015 was 300.  She received GCSF on 01/30 and 06/02/2015.  WBC on 06/03/2015 was 14,100.  She did not received GCSF.  Magnesium was 1.2.  Potassium was 3.8.  Symptomatically, she is doing well.  Her decubitus ulcer is doing well.  Wound care has prescribed zinc and vitamin C.  Her husband notes issues with tape and irritation around the dressing.  She is concerned because [facility] is out of network for her surgery.  Past Medical History  Diagnosis Date  . Leukopenia 2012    s/p bone marrow biopsy, Dr. Ma Hillock  . Cholelithiasis   . GERD (gastroesophageal reflux disease)   . Hypertension   . Hyperlipidemia   . Esophageal stricture   . Chronic diastolic CHF (congestive heart failure) ([place])   . CAD (coronary artery disease)     a. 09/2012 Cath: LM nl, LAD 95p, 25m LCX 930mOM2 50, RCA 100.  . Marland Kitchenypokalemia   . Herniated disc   . Pneumonia 2013; 08/2012    "one lung; double" (10/18/2012)  . Exertional shortness of breath   . Type I diabetes mellitus [place]    "dx'd in 1957" (10/18/2012)  . History of pancytopenia   . H/O hiatal hernia   . Rheumatoid arthritis(714.0)   . NSTEMI (non-ST elevated myocardial infarction) [place]    "mild" (10/18/2012)  . Collagen vascular disease ([facility]  .  [facility]    Past Surgical History  Procedure Laterality Date  . Esophagogastroduodenoscopy  2012    Dr. IfMinna Merritts. Tubal ligation  1970  . Cataract extraction w/ intraocular lens implant Right 2010  . Cardiac catheterization  10/18/2012    "first one was today" (10/18/2012)  . Esophageal dilation      "3 or 4 times" (10/19/2011)  . Coronary artery bypass graft N/A 10/19/2012    Procedure: CORONARY ARTERY BYPASS GRAFTING (CABG);  Surgeon: StMelrose NakayamaMD;  [facility];  Laterality: N/A;  . Peripheral vascular catheterization N/A 03/02/2015    Procedure: Porta Cath Insertion;  Surgeon: JaAlgernon HuxleyMD;  [facility];  Service: Cardiovascular;  Laterality: N/A;    Family History  Problem Relation Age of Onset  . Diabetes Mother   . Arthritis Mother   . Diabetes Father   . Arthritis Father   . Bone cancer Sister     Social History:  reports that she quit smoking about 20 years ago. Her smoking use included Cigarettes. She has a 30 pack-year smoking history. She has never used smokeless tobacco. She reports that she does not drink alcohol or use illicit drugs.  The patient is accompanied by her husband today.  Allergies:  Allergies  Allergen Reactions  . Codeine     nausea    Current Medications: Current Outpatient Prescriptions  Medication Sig Dispense Refill  . amLODipine (NORVASC) 5 MG tablet TAKE 1 TABLET BY MOUTH ONCE A DAY 90 tablet 2  . aspirin 81 MG tablet Take 81 mg by mouth daily.    Marland Kitchen atorvastatin (LIPITOR) 80 MG tablet Take 1 tablet (80 mg total) by mouth daily. 90 tablet 3  . Cholecalciferol (VITAMIN D) 2000 UNITS CAPS Take 1 capsule by mouth daily.    Marland Kitchen dexamethasone (DECADRON) 4 MG tablet 20 mg (5 tablets) 12 hours and 6 hours prior to chemotherapy 30 tablet 1  . etanercept (ENBREL) 25 MG injection Inject 25 mg into the skin once a week. Reported on 7/90/2409    . folic acid (FOLVITE) 1 MG  tablet Take 1 tablet (1 mg total) by mouth daily. 90 tablet 3  . furosemide (LASIX) 40 MG tablet Take 1 tablet (40 mg total) by mouth 2 (two) times daily. 180 tablet 3  . ibuprofen (ADVIL,MOTRIN) 800 MG tablet Take 1 tablet (800 mg total) by mouth 3 (three) times daily as needed. 270 tablet 3  . insulin lispro (HUMALOG) 100 UNIT/ML injection As directed via Insulin pump 30 mL 5  . lidocaine-prilocaine (EMLA) cream Apply 1 application topically as needed. Apply application 1 hour prior to treatment.  Cover with Press n seal until treatment time. 30 g 1  . loratadine (CLARITIN) 10 MG tablet Take 10 mg by mouth daily.    Marland Kitchen LORazepam (ATIVAN) 0.5 MG tablet Take 1 tablet (0.5 mg total) by mouth every 6 (six) hours as needed (nausea). 30 tablet 0  . losartan (COZAAR) 100 MG tablet Take 1 tablet (100 mg total) by mouth daily. 90 tablet 3  . magnesium oxide (MAG-OX) 400 MG tablet Take 400 mg by mouth daily. Reported on 05/19/2015    . methotrexate (RHEUMATREX) 2.5 MG tablet Take 4 tablets (10 mg total) by mouth once a week. Takes every Wednesday.Caution:Chemotherapy. Protect from light. 64 tablet 3  . metoprolol tartrate (LOPRESSOR) 25 MG tablet Take 1 tablet (25 mg total) by mouth 2 (two) times daily as needed. 180 tablet 3  . Multiple Vitamin (MULTIVITAMIN) tablet Take 1 tablet by mouth daily.      . ondansetron (ZOFRAN) 8 MG tablet Take 1 tablet (8 mg total) by mouth every 8 (eight) hours as needed for nausea or vomiting. 20 tablet 1  . pantoprazole (PROTONIX) 40 MG tablet Take 1 tablet (40 mg total) by mouth 2 (two) times daily. (Patient taking differently: Take 40 mg by mouth daily. ) 180 tablet 3  . potassium chloride SA (K-DUR,KLOR-CON) 10 MEQ tablet Take 1 tablet (10 mEq total) by mouth 2 (two) times daily. 20 tablet 0  . pregabalin (LYRICA) 75 MG capsule Take 75 mg by mouth 2 (two) times daily.    . ranitidine (ZANTAC) 150 MG tablet Take 1 tablet (150 mg total) by mouth 2 (two) times daily. 180  tablet 2  . saccharomyces boulardii (FLORASTOR) 250 MG capsule Take 250 mg by mouth as needed. Reported on 05/06/2015    . vitamin C (ASCORBIC ACID) 500 MG tablet Take 500 mg by mouth daily.    . Wound Dressings (MEDIHONEY CA ALGINATE 2"X2") PADS Apply to sacral wound daily. 10 each 3  . zinc gluconate 50 MG tablet Take 50 mg by mouth daily.    . zoledronic acid (RECLAST) 5 MG/100ML SOLN Inject 5  mg into the vein once.     No current facility-administered medications for this visit.   Facility-Administered Medications Ordered in Other Visits  Medication Dose Route Frequency Provider Last Rate Last Dose  . sodium chloride 0.9 % injection 10 mL  10 mL Intracatheter PRN Lequita Asal, MD      . sodium chloride 0.9 % injection 10 mL  10 mL Intravenous PRN Lequita Asal, MD   10 mL at 04/13/15 0300    Review of Systems:  GENERAL:  Feeling better.  No fevers or sweats.  Weight down 1 pound. PERFORMANCE STATUS (ECOG):  1 HEENT:  No visual changes, sore throat, mouth sores or tenderness. Lungs:  No shortness of breath.  No cough.  No hemoptysis. Cardiac:  No chest pain, palpitations, orthopnea, or PND. GI:  Appetite modest.  Constipation.  No vomiting.  No diarrhea, melena or hematochezia. GU:  No urgency, frequency, dysuria, or hematuria. Musculoskeletal:  Severe rheumatoid arthritis.  Osteoporosis.  No back pain. No muscle tenderness. Extremities:  No pain or swelling. Skin:  Decubitus ulcer, improved.  Tape irritation.  No rashes or skin changes. Neuro:  No headache, numbness or weakness, balance or coordination issues. Endocrine:  Diabetes.  Insulin pump.  No thyroid issues, hot flashes or night sweats. Psych:  No mood changes, depression or anxiety. Pain:  No focal pain. Review of systems:  All other systems reviewed and found to be negative.  Physical Exam: Blood pressure 125/72, pulse 75, temperature 97.8 F (36.6 C), temperature source Tympanic, resp. rate 18, height 5' 3"   (1.6 m), weight 141 lb 12.1 oz (64.3 kg). GENERAL:  Well developed, well nourished, sitting comfortably in the exam room in no acute distress.  She has a cane at her side. MENTAL STATUS:  Alert and oriented to person, place and time. HEAD:  Wearing a cream colored hat.  Normocephalic, atraumatic, face symmetric, no Cushingoid features. EYES:  Gold rimmed glasses.  Blue eyes.  No conjunctivitis or scleral icterus.  ENT: Oropharynx clear without lesion. Tongue normal. Mucous membranes moist.  RESPIRATORY: Clear to auscultation without rales, wheezes or rhonchi. CARDIOVASCULAR: Regular rate and rhythm without murmur, rub or gallop. ABDOMEN: Soft, non-tender with active bowel sounds and no appreciable hepatosplenomegaly. No palpable nodularity or masses. RECTAL:  8-10 mm cm decubitus ulcer, stage II (deep), with clean borders and no evidence of infection, near right gluteal fold. SKIN: Decubitus ulcer as above.  Hyperpigmentation around decubitus in shape of a bandage (taper irritation).  Further taper irritation with few tiny eschars.  No other rashes, ulcers or lesions. EXTREMITIES: No edema, skin discoloration or tenderness. No palpable cords. LYMPH NODES: No palpable cervical, supraclavicular, axillary or inguinal adenopathy  NEUROLOGICAL: Appropriate. PSYCH: Appropriate.  Appointment on 06/03/2015  Component Date Value Ref Range Status  . Magnesium 06/03/2015 1.2* 1.7 - 2.4 mg/dL Final  . Potassium 06/03/2015 3.8  3.5 - 5.1 mmol/L Final  . WBC 06/03/2015 14.1* 3.6 - 11.0 K/uL Final  . RBC 06/03/2015 3.16* 3.80 - 5.20 MIL/uL Final  . Hemoglobin 06/03/2015 9.3* 12.0 - 16.0 g/dL Final  . HCT 06/03/2015 27.5* 35.0 - 47.0 % Final  . MCV 06/03/2015 87.0  80.0 - 100.0 fL Final  . MCH 06/03/2015 29.6  26.0 - 34.0 pg Final  . MCHC 06/03/2015 34.0  32.0 - 36.0 g/dL Final  . RDW 06/03/2015 19.2* 11.5 - 14.5 % Final  . Platelets 06/03/2015 160  150 - 440 K/uL Final    Assessment:  [name] is a 70 y.o. female with clinical stage IIIC (T3cN1Mx) ovarian cancer presenting with abdominal discomfort and bloating.  Omental biopsy on 02/23/2015 revealed metastatic high grade serous carcinoma, consistent with gynecologic origin.  CA125 was 707 on 02/17/2015.  Abdomen and pelvic CT scan on 02/13/2015 revealed bilateral mass-like adnexal regions (right adnexal mass 5.6 x 5.0 cm and the left adnexa mass 10.3 x 6.0 x 9.9 cm).  There was a large amount of soft tissue throughout the peritoneal cavity involving the omentum and other peritoneal surfaces.  There was a small volume ascites. There was a peripheral 3.3 x 1.9 cm low-attenuation lesion overlying the right lobe of the liver , likely representing a serosal implant. There was 1.4 x 2.2 cm ill-defined peripheral lesion within the inferior aspect of segment 6 adjacent to the ampulla in the duodenum.   She has severe rheumatoid arthritis.  Methotrexate and Enbrel are on hold.  She has a normocytic anemia.  Work-up on 02/17/2015 revealed a normal ferritin, B12, folate, TSH.  She denies any melena or hematochezia.  She has diabetes and is on an insulin pump.  She is currently day 15 status post cycle #4 neoadjuvant carboplatin and Taxol (03/05/2015 - 05/22/2015).  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  She had loose stools on oral magnesium.  She was initially on Neurontin then switched Lyrica with cycle #3.  Cycle #4 was notable for neutropenia (ANC 300) requiring GCSF x 3 days.  CA125 was 802.9 on 03/30/2015, 567.9 on 04/13/2015, and 168.8 on 05/15/2015.  Abdominal and pelvic CT scan on 04/28/2015 revealed decreasing bilateral ovarian masses.   The left adnexal mass measured 4.0 x 6.1 cm (previously 5.0 x 8.5 cm). The right ovary measured 3.8 x 4.9 cm (previously 4.7 x 5.6 cm).  There was improved peritoneal carcinomatosis.  There was a small amount of ascites.  The hepatic dome lesion was stable. The previously seen right  hepatic lobe lesion was not well-visualized.  There was a small left pleural effusion and trace right pleural effusion.  The nodular lesion at the ampulla of Vater, extending into the duodenum was stable.  There was no biliary ductal dilatation.  Symptomatically, she is doing well.  Her decubitus ulcer is healing slowly.  She has some tape irritation associated with the dressing.  She is concerned because [facility] is out of network for her surgery.  Counts have recovered.  Her magnesium is low.  Plan: 1.  Discuss care of decubitus ulcer. 2.  Discuss follow-up with Barbera Setters from [place] services [phone]) re:  Coverage of surgery at [facility]. 3.  Magnesium 2 gm IV. 4.  RTC in 1 week for labs (BMP, Mg) +/- IV Mg 5.  Patient to call with follow-up after disposition by surgery     Lequita Asal, MD  06/05/2015, 10:41 AM

## 2015-06-05 NOTE — Progress Notes (Signed)
  Oncology Nurse Navigator Documentation  Navigator Location: [facility] (06/05/15 0900) Navigator Encounter Type: Clinic/MDC (06/05/15 0900)               Barriers/Navigation Needs: Coordination of Care (06/05/15 0900)   Interventions: Coordination of Care (06/05/15 0900)                      Time Spent with Patient: 15 (06/05/15 0900)   [facility] is out of network for [name]. I have spoken with Erline Levine at [facility] oncology and she provided me with phone numbers for gyn clinic financial, Dorothyann Gibbs, [phone] and surgery precertification representative [phone]. I have provided these to [name].

## 2015-06-08 ENCOUNTER — Encounter: Payer: Medicare Other | Attending: Surgery | Admitting: Surgery

## 2015-06-08 DIAGNOSIS — I110 Hypertensive heart disease with heart failure: Secondary | ICD-10-CM | POA: Insufficient documentation

## 2015-06-08 DIAGNOSIS — L89313 Pressure ulcer of right buttock, stage 3: Secondary | ICD-10-CM | POA: Insufficient documentation

## 2015-06-08 DIAGNOSIS — Z9221 Personal history of antineoplastic chemotherapy: Secondary | ICD-10-CM | POA: Insufficient documentation

## 2015-06-08 DIAGNOSIS — M069 Rheumatoid arthritis, unspecified: Secondary | ICD-10-CM | POA: Diagnosis not present

## 2015-06-08 DIAGNOSIS — I5032 Chronic diastolic (congestive) heart failure: Secondary | ICD-10-CM | POA: Diagnosis not present

## 2015-06-08 DIAGNOSIS — Z87891 Personal history of nicotine dependence: Secondary | ICD-10-CM | POA: Insufficient documentation

## 2015-06-08 DIAGNOSIS — E10622 Type 1 diabetes mellitus with other skin ulcer: Secondary | ICD-10-CM | POA: Insufficient documentation

## 2015-06-08 DIAGNOSIS — I2510 Atherosclerotic heart disease of native coronary artery without angina pectoris: Secondary | ICD-10-CM | POA: Diagnosis not present

## 2015-06-08 DIAGNOSIS — C786 Secondary malignant neoplasm of retroperitoneum and peritoneum: Secondary | ICD-10-CM | POA: Diagnosis not present

## 2015-06-09 ENCOUNTER — Telehealth: Payer: Self-pay | Admitting: Internal Medicine

## 2015-06-09 NOTE — Progress Notes (Signed)
KAOIR, COURCHESNE ([id number]) Visit Report for 06/08/2015 Arrival Information Details Patient Name: [name], [name] Date of Service: 06/08/2015 2:00 PM Medical Record Number: [id number] Patient Account Number: [id number] Date of Birth/Sex: [birth year] (70 y.o. Female) Treating RN: Ahmed Prima Primary Care Physician: Ronette Deter Other Clinician: Referring Physician: Ronette Deter Treating Physician/Extender: Frann Rider in Treatment: 2 Visit Information History Since Last Visit All ordered tests and consults were completed: No Patient Arrived: [name] Added or deleted any medications: No Arrival Time: 14:03 Any new allergies or adverse reactions: No Accompanied By: sister Had a fall or experienced change in No Transfer Assistance: None activities of daily living that may affect Patient Identification Verified: Yes risk of falls: Secondary Verification Process Completed: Yes Signs or symptoms of abuse/neglect since last No Patient Requires Transmission-Based No visito Precautions: Hospitalized since last visit: No Patient Has Alerts: No Pain Present Now: No Electronic Signature(s) Signed: 06/08/2015 4:43:39 PM By: Alric Quan Entered By: Alric Quan on 06/08/2015 14:03:24 [name] ([id number]) -------------------------------------------------------------------------------- Encounter Discharge Information Details Patient Name: [name] Date of Service: 06/08/2015 2:00 PM Medical Record Number: [id number] Patient Account Number: [id number] Date of Birth/Sex: Oct 27, 1945 (70 y.o. Female) Treating RN: Ahmed Prima Primary Care Physician: Ronette Deter Other Clinician: Referring Physician: Ronette Deter Treating Physician/Extender: Frann Rider in Treatment: 2 Encounter Discharge Information Items Discharge Pain Level: 0 Discharge Condition: Stable Ambulatory Status: Cane Discharge Destination: Home Transportation: Private  Auto Accompanied By: sister Schedule Follow-up Appointment: Yes Medication Reconciliation completed Yes and provided to Patient/Care [name]: Provided on Clinical Summary of Care: 06/08/2015 Form Type Recipient Paper Patient SR Electronic Signature(s) Signed: 06/08/2015 2:34:36 PM By: Ruthine Dose Entered By: Ruthine Dose on 06/08/2015 14:34:36 [name], [name] ([id number]) -------------------------------------------------------------------------------- Lower Extremity Assessment Details Patient Name: [name] Date of Service: 06/08/2015 2:00 PM Medical Record Number: [id number] Patient Account Number: [id number] Date of Birth/Sex: 11-24-1945 (70 y.o. Female) Treating RN: Carolyne Fiscal, Debi Primary Care Physician: Ronette Deter Other Clinician: Referring Physician: Ronette Deter Treating Physician/Extender: Frann Rider in Treatment: 2 Electronic Signature(s) Signed: 06/08/2015 4:43:39 PM By: Alric Quan Entered By: Alric Quan on 06/08/2015 14:05:33 [name] ([id number]) -------------------------------------------------------------------------------- Multi Wound Chart Details Patient Name: [name] Date of Service: 06/08/2015 2:00 PM Medical Record Number: [id number] Patient Account Number: [id number] Date of Birth/Sex: 1946-04-29 (70 y.o. Female) Treating RN: Ahmed Prima Primary Care Physician: Ronette Deter Other Clinician: Referring Physician: Ronette Deter Treating Physician/Extender: Frann Rider in Treatment: 2 Vital Signs Height(in): 63 Pulse(bpm): 79 Weight(lbs): 145 Blood Pressure 142/57 (mmHg): Body Mass Index(BMI): 26 Temperature(F): 97.5 Respiratory Rate 16 (breaths/min): Photos: [1:No Photos] [N/A:N/A] Wound Location: [1:Right Gluteus] [N/A:N/A] Wounding Event: [1:Pressure Injury] [N/A:N/A] Primary Etiology: [1:Pressure Ulcer] [N/A:N/A] Comorbid History: [1:Cataracts, Type I Diabetes,  Rheumatoid Arthritis, Osteoarthritis, Neuropathy, Received Chemotherapy] [N/A:N/A] Date Acquired: [1:05/25/2015] [N/A:N/A] Weeks of Treatment: [1:2] [N/A:N/A] Wound Status: [1:Open] [N/A:N/A] Measurements L x W x D 0.7x0.4x0.4 [N/A:N/A] (cm) Area (cm) : [1:0.22] [N/A:N/A] Volume (cm) : [1:0.088] [N/A:N/A] % Reduction in Area: [1:72.00%] [N/A:N/A] % Reduction in Volume: 72.00% [N/A:N/A] Starting Position 1 12 (o'clock): Ending Position 1 [1:2] (o'clock): Maximum Distance 1 0.6 (cm): Undermining: [1:Yes] [N/A:N/A] Classification: [1:Category/Stage III] [N/A:N/A] Exudate Amount: [1:Large] [N/A:N/A] Exudate Type: [1:Serous] [N/A:N/A] Exudate Color: [1:amber] [N/A:N/A] Wound Margin: [1:Flat and Intact] [N/A:N/A] Granulation Amount: Medium (34-66%) N/A N/A Granulation Quality: Pink N/A N/A Necrotic Amount: Medium (34-66%) N/A N/A Exposed Structures: Fascia: No N/A N/A Fat: No Tendon: No Muscle: No Joint: No Bone: No Limited to Skin  Breakdown Epithelialization: None N/A N/A Periwound Skin Texture: Edema: No N/A N/A Excoriation: No Induration: No Callus: No Crepitus: No Fluctuance: No Friable: No Rash: No Scarring: No Periwound Skin Moist: Yes N/A N/A Moisture: Maceration: No Dry/Scaly: No Periwound Skin Color: Atrophie Blanche: No N/A N/A Cyanosis: No Ecchymosis: No Erythema: No Hemosiderin Staining: No Mottled: No Pallor: No Rubor: No Temperature: No Abnormality N/A N/A Tenderness on Yes N/A N/A Palpation: Wound Preparation: Ulcer Cleansing: N/A N/A Rinsed/Irrigated with Saline Topical Anesthetic Applied: Other: lidocaine 4% Treatment Notes Electronic Signature(s) Signed: 06/08/2015 4:43:39 PM By: Alric Quan Entered By: Alric Quan on 06/08/2015 14:16:00 [name] ([id number]) [address] [name] ([id number]) -------------------------------------------------------------------------------- [place] Details Patient  Name: [name] Date of Service: 06/08/2015 2:00 PM Medical Record Number: [id number] Patient Account Number: [id number] Date of Birth/Sex: 01-Jul-1945 (70 y.o. Female) Treating RN: Ahmed Prima Primary Care Physician: Ronette Deter Other Clinician: Referring Physician: Ronette Deter Treating Physician/Extender: Frann Rider in Treatment: 2 Active Inactive Orientation to the Wound Care Program Nursing Diagnoses: Knowledge deficit related to the wound healing center program Goals: Patient/caregiver will verbalize understanding of the [place] Program Date Initiated: 05/25/2015 Goal Status: Active Interventions: Provide education on orientation to the wound center Notes: Pressure Nursing Diagnoses: Knowledge deficit related to causes and risk factors for pressure ulcer development Knowledge deficit related to management of pressures ulcers Potential for impaired tissue integrity related to pressure, friction, moisture, and shear Goals: Patient will remain free from development of additional pressure ulcers Date Initiated: 05/25/2015 Goal Status: Active Patient will remain free of pressure ulcers Date Initiated: 05/25/2015 Goal Status: Active Patient/caregiver will verbalize risk factors for pressure ulcer development Date Initiated: 05/25/2015 Goal Status: Active Patient/caregiver will verbalize understanding of pressure ulcer management Date Initiated: 05/25/2015 Goal Status: Active Interventions: DERHONDA, [name] ([id number]) Assess: immobility, friction, shearing, incontinence upon admission and as needed Assess offloading mechanisms upon admission and as needed Assess potential for pressure ulcer upon admission and as needed Provide education on pressure ulcers Treatment Activities: Patient referred for home evaluation of offloading devices/mattresses : 06/08/2015 Patient referred for seating evaluation to ensure proper offloading :  06/08/2015 Pressure reduction/relief device ordered : 06/08/2015 Notes: Wound/Skin Impairment Nursing Diagnoses: Impaired tissue integrity Knowledge deficit related to ulceration/compromised skin integrity Goals: Patient/caregiver will verbalize understanding of skin care regimen Date Initiated: 05/25/2015 Goal Status: Active Ulcer/skin breakdown will have a volume reduction of 30% by week 4 Date Initiated: 05/25/2015 Goal Status: Active Ulcer/skin breakdown will have a volume reduction of 50% by week 8 Date Initiated: 05/25/2015 Goal Status: Active Ulcer/skin breakdown will have a volume reduction of 80% by week 12 Date Initiated: 05/25/2015 Goal Status: Active Ulcer/skin breakdown will heal within 14 weeks Date Initiated: 05/25/2015 Goal Status: Active Interventions: Assess patient/caregiver ability to obtain necessary supplies Assess patient/caregiver ability to perform ulcer/skin care regimen upon admission and as needed Assess ulceration(s) every visit Provide education on ulcer and skin care Treatment Activities: Patient referred to home care : 06/08/2015 Referred to DME [name] for dressing supplies : 06/08/2015 Topical wound management initiated : 06/08/2015 [name], [name] ([id number]) Notes: Electronic Signature(s) Signed: 06/08/2015 4:43:39 PM By: Alric Quan Entered By: Alric Quan on 06/08/2015 14:15:54 [name] ([id number]) -------------------------------------------------------------------------------- Pain Assessment Details Patient Name: [name] Date of Service: 06/08/2015 2:00 PM Medical Record Number: [id number] Patient Account Number: [id number] Date of Birth/Sex: 01-09-1946 (70 y.o. Female) Treating RN: Carolyne Fiscal, Debi Primary Care Physician: Ronette Deter Other Clinician: Referring Physician: Ronette Deter  Treating Physician/Extender: Frann Rider in Treatment: 2 Active Problems Location of Pain Severity and Description of  Pain Patient Has Paino No Site Locations Pain Management and Medication Current Pain Management: Electronic Signature(s) Signed: 06/08/2015 4:43:39 PM By: Alric Quan Entered By: Alric Quan on 06/08/2015 14:03:30 [name] ([id number]) -------------------------------------------------------------------------------- Patient/Caregiver Education Details Patient Name: [name] Date of Service: 06/08/2015 2:00 PM Medical Record Number: [id number] Patient Account Number: [id number] Date of Birth/Gender: [birth year] (70 y.o. Female) Treating RN: Ahmed Prima Primary Care Physician: Ronette Deter Other Clinician: Referring Physician: Ronette Deter Treating Physician/Extender: Frann Rider in Treatment: 2 Education Assessment Education Provided To: Patient Education Topics Provided Wound/Skin Impairment: Handouts: Other: change dressing as directed Methods: Demonstration, Explain/Verbal Responses: State content correctly Electronic Signature(s) Signed: 06/08/2015 4:43:39 PM By: Alric Quan Entered By: Alric Quan on 06/08/2015 14:25:41 [name] ([id number]) -------------------------------------------------------------------------------- Wound Assessment Details Patient Name: [name] Date of Service: 06/08/2015 2:00 PM Medical Record Number: [id number] Patient Account Number: [id number] Date of Birth/Sex: 1946/03/13 (70 y.o. Female) Treating RN: Carolyne Fiscal, Debi Primary Care Physician: Ronette Deter Other Clinician: Referring Physician: Ronette Deter Treating Physician/Extender: Frann Rider in Treatment: 2 Wound Status Wound Number: 1 Primary Pressure Ulcer Etiology: Wound Location: Right Gluteus Wound Open Wounding Event: Pressure Injury Status: Date Acquired: 05/25/2015 Comorbid Cataracts, Type I Diabetes, Rheumatoid Weeks Of Treatment: 2 History: Arthritis, Osteoarthritis, Neuropathy, Clustered  Wound: No Received Chemotherapy Photos Photo Uploaded By: Alric Quan on 06/08/2015 16:36:13 Wound Measurements Length: (cm) 0.7 Width: (cm) 0.4 Depth: (cm) 0.3 Area: (cm) 0.22 Volume: (cm) 0.066 % Reduction in Area: 72% % Reduction in Volume: 79% Epithelialization: None Tunneling: No Undermining: Yes Starting Position (o'clock): 12 Ending Position (o'clock): 2 Maximum Distance: (cm) 0.6 Wound Description Classification: Category/Stage III Wound Margin: Flat and Intact Exudate Amount: Large Exudate Type: Serous Exudate Color: amber Foul Odor After Cleansing: No Wound Bed [name], SZASZ. ([id number]) Granulation Amount: Medium (34-66%) Exposed Structure Granulation Quality: Pink Fascia Exposed: No Necrotic Amount: Medium (34-66%) Fat Layer Exposed: No Necrotic Quality: Adherent Slough Tendon Exposed: No Muscle Exposed: No Joint Exposed: No Bone Exposed: No Limited to Skin Breakdown Periwound Skin Texture Texture Color No Abnormalities Noted: No No Abnormalities Noted: No Callus: No Atrophie Blanche: No Crepitus: No Cyanosis: No Excoriation: No Ecchymosis: No Fluctuance: No Erythema: No Friable: No Hemosiderin Staining: No Induration: No Mottled: No Localized Edema: No Pallor: No Rash: No Rubor: No Scarring: No Temperature / Pain Moisture Temperature: No Abnormality No Abnormalities Noted: No Tenderness on Palpation: Yes Dry / Scaly: No Maceration: No Moist: Yes Wound Preparation Ulcer Cleansing: Rinsed/Irrigated with Saline Topical Anesthetic Applied: Other: lidocaine 4%, Treatment Notes Wound #1 (Right Gluteus) 1. Cleansed with: Clean wound with Normal Saline 2. Anesthetic Topical Lidocaine 4% cream to wound bed prior to debridement 4. Dressing Applied: Aquacel Ag 5. Secondary [place] Signature(s) Signed: 06/08/2015 4:43:39 PM By: Alric Quan Entered By: Alric Quan on 06/08/2015  14:23:54 [name] ([id number]) Stann Mainland, [name] ([id number]) -------------------------------------------------------------------------------- Vitals Details Patient Name: [name] Date of Service: 06/08/2015 2:00 PM Medical Record Number: [id number] Patient Account Number: [id number] Date of Birth/Sex: 03/13/1946 (70 y.o. Female) Treating RN: Carolyne Fiscal, Debi Primary Care Physician: Ronette Deter Other Clinician: Referring Physician: Ronette Deter Treating Physician/Extender: Frann Rider in Treatment: 2 Vital Signs Time Taken: 14:03 Temperature (F): 97.5 Height (in): 63 Pulse (bpm): 79 Weight (lbs): 145 Respiratory Rate (breaths/min): 16 Body Mass Index (BMI): 25.7 Blood Pressure (mmHg): 142/57 Reference  Range: 80 - 120 mg / dl Electronic Signature(s) Signed: 06/08/2015 4:43:39 PM By: Alric Quan Entered By: Alric Quan on 06/08/2015 14:05:26

## 2015-06-09 NOTE — Progress Notes (Signed)
LEISL, DENMAN ([id number]) Visit Report for 06/08/2015 Chief Complaint Document Details Patient Name: [name], [name] Date of Service: 06/08/2015 2:00 PM Medical Record Number: [id number] Patient Account Number: [id number] Date of Birth/Sex: 03/28/1946 (69 y.o. Female) Treating RN: Ahmed Prima Primary Care Physician: Ronette Deter Other Clinician: Referring Physician: Ronette Deter Treating Physician/Extender: Frann Rider in Treatment: 2 Information Obtained from: Patient Chief Complaint Patient is at the clinic for treatment of an open pressure ulcer to the left gluteal region for about 3 weeks now. Electronic Signature(s) Signed: 06/08/2015 2:24:56 PM By: Christin Fudge MD, FACS Entered By: Christin Fudge on 06/08/2015 14:24:56 [name] ([id number]) -------------------------------------------------------------------------------- Debridement Details Patient Name: [name] Date of Service: 06/08/2015 2:00 PM Medical Record Number: [id number] Patient Account Number: [id number] Date of Birth/Sex: 15-Sep-1945 (69 y.o. Female) Treating RN: Carolyne Fiscal, Debi Primary Care Physician: Ronette Deter Other Clinician: Referring Physician: Ronette Deter Treating Physician/Extender: Frann Rider in Treatment: 2 Debridement Performed for Wound #1 Right Gluteus Assessment: Performed By: Physician Christin Fudge, MD Debridement: Debridement Pre-procedure Yes Verification/Time Out Taken: Start Time: 14:19 Pain Control: Other : Lidocaine 4% cream Level: Skin/Subcutaneous Tissue Total Area Debrided (L x 0.7 (cm) x 0.4 (cm) = 0.28 (cm) W): Tissue and other Viable, Non-Viable, Exudate, Fibrin/Slough, Subcutaneous material debrided: Instrument: Forceps Bleeding: Minimum Hemostasis Achieved: Pressure End Time: 14:22 Procedural Pain: 0 Post Procedural Pain: 0 Response to Treatment: Procedure was tolerated well Post Debridement Measurements of Total  Wound Length: (cm) 0.7 Stage: Category/Stage III Width: (cm) 0.4 Depth: (cm) 0.3 Volume: (cm) 0.066 Post Procedure Diagnosis Same as Pre-procedure Electronic Signature(s) Signed: 06/08/2015 2:24:50 PM By: Christin Fudge MD, FACS Signed: 06/08/2015 4:43:39 PM By: Alric Quan Entered By: Christin Fudge on 06/08/2015 14:24:49 [name] ([id number]) -------------------------------------------------------------------------------- HPI Details Patient Name: [name] Date of Service: 06/08/2015 2:00 PM Medical Record Number: [id number] Patient Account Number: [id number] Date of Birth/Sex: 02-Aug-1945 (69 y.o. Female) Treating RN: Ahmed Prima Primary Care Physician: Ronette Deter Other Clinician: Referring Physician: Ronette Deter Treating Physician/Extender: Frann Rider in Treatment: 2 History of Present Illness Location: ulcerated on the right gluteal region just to the right of the midline Quality: Patient reports experiencing a dull pain to affected area(s). Severity: Patient states wound are getting worse. Duration: Patient has had the wound for < 3 weeks prior to presenting for treatment Timing: Pain in wound is Intermittent (comes and goes Context: The wound appeared gradually over time Modifying Factors: Consults to this date include: local care was done as per the medication given by the PCP which may have been medihoney Associated Signs and Symptoms: Patient reports having difficulty standing for long periods. 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 Susy Manor. he was referred to [place] for a decubitus ulcer on her right gluteal area. she is known to have diabetes mellitus type 1 and her most recent A1c was 7.7% Past medical history significant for leukopenia, cholelithiasis, hypertension, chronic diastolic CHF, coronary artery  disease,type 1 diabetes mellitus, rheumatoid arthritis, collagen vascular disease, ovarian cancer, status post CABG in 2014 and status post Port-A-Cath insertion by Dr. Leotis Pain in October 2016. She has quit smoking about 20 years ago. She was advised to use Medihoney with calcium alginate pads to be applied over the wound. Electronic Signature(s) Signed: 06/08/2015 2:25:07 PM By: Christin Fudge MD, FACS Entered By: Christin Fudge on 06/08/2015 14:25:06 [name] ([id number]) -------------------------------------------------------------------------------- Physical  Exam Details Patient Name: [name], [name] Date of Service: 06/08/2015 2:00 PM Medical Record Number: [id number] Patient Account Number: [id number] Date of Birth/Sex: 05-30-1945 (69 y.o. Female) Treating RN: Ahmed Prima Primary Care Physician: Ronette Deter Other Clinician: Referring Physician: Ronette Deter Treating Physician/Extender: Frann Rider in Treatment: 2 Constitutional . Pulse regular. Respirations normal and unlabored. Afebrile. . Eyes Nonicteric. Reactive to light. Ears, Nose, Mouth, and Throat Lips, teeth, and gums WNL.Marland Kitchen Moist mucosa without lesions. Neck supple and nontender. No palpable supraclavicular or cervical adenopathy. Normal sized without goiter. Respiratory WNL. No retractions.. Cardiovascular Pedal Pulses WNL. No clubbing, cyanosis or edema. Lymphatic No adneopathy. No adenopathy. No adenopathy. Musculoskeletal Adexa without tenderness or enlargement.. Digits and nails w/o clubbing, cyanosis, infection, petechiae, ischemia, or inflammatory conditions.. Integumentary (Hair, Skin) No suspicious lesions. No crepitus or fluctuance. No peri-wound warmth or erythema. No masses.Marland Kitchen Psychiatric Judgement and insight Intact.. No evidence of depression, anxiety, or agitation.. Notes the wound was some slough on the lateral border and this was sharply debrided with a forcep and all  the necrotic debris is more or less gone. Bleeding was controlled with pressure. Electronic Signature(s) Signed: 06/08/2015 2:25:55 PM By: Christin Fudge MD, FACS Entered By: Christin Fudge on 06/08/2015 14:25:55 [name] ([id number]) -------------------------------------------------------------------------------- Physician Orders Details Patient Name: [name] Date of Service: 06/08/2015 2:00 PM Medical Record Number: [id number] Patient Account Number: [id number] Date of Birth/Sex: 04/26/1946 (69 y.o. Female) Treating RN: Carolyne Fiscal, Debi Primary Care Physician: Ronette Deter Other Clinician: Referring Physician: Ronette Deter Treating Physician/Extender: Frann Rider in Treatment: 2 Verbal / Phone Orders: Yes Clinician: Carolyne Fiscal, Debi Read Back and Verified: Yes Diagnosis Coding Wound Cleansing Wound #1 Right Gluteus o Cleanse wound with mild soap and water o May Shower, gently pat wound dry prior to applying new dressing. Anesthetic Wound #1 Right Gluteus o Topical Lidocaine 4% cream applied to wound bed prior to debridement Primary Wound Dressing Wound #1 Right Gluteus o Aquacel Ag Secondary Dressing Wound #1 Right Gluteus o Non-adherent pad Dressing Change Frequency Wound #1 Right Gluteus o Change dressing every other day. Follow-up Appointments Wound #1 Right Gluteus o Return Appointment in 1 week. Off-Loading Wound #1 Right Gluteus o Turn and reposition every 2 hours o Mattress Additional Orders / Instructions Wound #1 Right Gluteus o Increase protein intake. o Activity as tolerated o Other: - Include Zinc, MVI and Vit C daily. [name], [name] ([id number]) Electronic Signature(s) Signed: 06/08/2015 4:17:59 PM By: Christin Fudge MD, FACS Signed: 06/08/2015 4:43:39 PM By: Alric Quan Entered By: Alric Quan on 06/08/2015 14:24:22 [name], [name]  ([id number]) -------------------------------------------------------------------------------- Problem List Details Patient Name: [name] Date of Service: 06/08/2015 2:00 PM Medical Record Number: [id number] Patient Account Number: [id number] Date of Birth/Sex: 09-08-1945 (70 y.o. Female) Treating RN: Carolyne Fiscal, Debi Primary Care Physician: Ronette Deter Other Clinician: Referring Physician: Ronette Deter Treating Physician/Extender: Frann Rider in Treatment: 2 Active Problems ICD-10 Encounter Code Description Active Date Diagnosis E10.622 Type 1 diabetes mellitus with other skin ulcer 05/25/2015 Yes L89.313 Pressure ulcer of right buttock, stage 3 05/25/2015 Yes Z92.21 Personal history of antineoplastic chemotherapy 05/25/2015 Yes Inactive Problems Resolved Problems Electronic Signature(s) Signed: 06/08/2015 2:24:35 PM By: Christin Fudge MD, FACS Entered By: Christin Fudge on 06/08/2015 14:24:35 [name] ([id number]) -------------------------------------------------------------------------------- Progress Note Details Patient Name: [name] Date of Service: 06/08/2015 2:00 PM Medical Record Number: [id number] Patient Account Number: [id number] Date of Birth/Sex: 1946/01/19 (70 y.o. Female) Treating RN: Ahmed Prima Primary Care Physician:  Ronette Deter Other Clinician: Referring Physician: Ronette Deter Treating Physician/Extender: Frann Rider in Treatment: 2 Subjective Chief Complaint Information obtained from Patient Patient is at the clinic for treatment of an open pressure ulcer to the left gluteal region for about 3 weeks now. History of Present Illness (HPI) The following HPI elements were documented for the patient's wound: Location: ulcerated on the right gluteal region just to the right of the midline Quality: Patient reports experiencing a dull pain to affected area(s). Severity: Patient states wound are getting  worse. Duration: Patient has had the wound for < 3 weeks prior to presenting for treatment Timing: Pain in wound is Intermittent (comes and goes Context: The wound appeared gradually over time Modifying Factors: Consults to this date include: local care was done as per the medication given by the PCP which may have been medihoney Associated Signs and Symptoms: Patient reports having difficulty standing for long periods. 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 Susy Manor. he was referred to [place] for a decubitus ulcer on her right gluteal area. she is known to have diabetes mellitus type 1 and her most recent A1c was 7.7% Past medical history significant for leukopenia, cholelithiasis, hypertension, chronic diastolic CHF, coronary artery disease,type 1 diabetes mellitus, rheumatoid arthritis, collagen vascular disease, ovarian cancer, status post CABG in 2014 and status post Port-A-Cath insertion by Dr. Leotis Pain in October 2016. She has quit smoking about 20 years ago. She was advised to use Medihoney with calcium alginate pads to be applied over the wound. Objective Constitutional Pulse regular. Respirations normal and unlabored. Afebrile. Vitals Time Taken: 2:03 PM, Height: 63 in, Weight: 145 lbs, BMI: 25.7, Temperature: 97.5 F, Pulse: 79 [name], Janet W. ([id number]) bpm, Respiratory Rate: 16 breaths/min, Blood Pressure: 142/57 mmHg. Eyes Nonicteric. Reactive to light. Ears, Nose, Mouth, and Throat Lips, teeth, and gums WNL.Marland Kitchen Moist mucosa without lesions. Neck supple and nontender. No palpable supraclavicular or cervical adenopathy. Normal sized without goiter. Respiratory WNL. No retractions.. Cardiovascular Pedal Pulses WNL. No clubbing, cyanosis or edema. Lymphatic No adneopathy. No adenopathy. No adenopathy. Musculoskeletal Adexa without tenderness or enlargement.. Digits and  nails w/o clubbing, cyanosis, infection, petechiae, ischemia, or inflammatory conditions.Marland Kitchen Psychiatric Judgement and insight Intact.. No evidence of depression, anxiety, or agitation.. General Notes: the wound was some slough on the lateral border and this was sharply debrided with a forcep and all the necrotic debris is more or less gone. Bleeding was controlled with pressure. Integumentary (Hair, Skin) No suspicious lesions. No crepitus or fluctuance. No peri-wound warmth or erythema. No masses.. Wound #1 status is Open. Original cause of wound was Pressure Injury. The wound is located on the Right Gluteus. The wound measures 0.7cm length x 0.4cm width x 0.3cm depth; 0.22cm^2 area and 0.066cm^3 volume. The wound is limited to skin breakdown. There is no tunneling noted, however, there is undermining starting at 12:00 and ending at 2:00 with a maximum distance of 0.6cm. There is a large amount of serous drainage noted. The wound margin is flat and intact. There is medium (34-66%) pink granulation within the wound bed. There is a medium (34-66%) amount of necrotic tissue within the wound bed including Adherent Slough. The periwound skin appearance exhibited: Moist. The periwound skin appearance did not exhibit: Callus, Crepitus, Excoriation, Fluctuance, Friable, Induration, Localized Edema, Rash, Scarring, Dry/Scaly, Maceration, Atrophie Blanche, Cyanosis, Ecchymosis, Hemosiderin Staining, Mottled, Pallor, Rubor, Erythema. Periwound temperature was noted as No Abnormality.  The periwound has tenderness on palpation. Assessment [name], [name] ([id number]) Active Problems ICD-10 E10.622 - Type 1 diabetes mellitus with other skin ulcer L89.313 - Pressure ulcer of right buttock, stage 3 Z92.21 - Personal history of antineoplastic chemotherapy Procedures Wound #1 Wound #1 is a Pressure Ulcer located on the Right Gluteus . There was a Skin/Subcutaneous Tissue Debridement [id number])  debridement with total area of 0.28 sq cm performed by Christin Fudge, MD. with the following instrument(s): Forceps to remove Viable and Non-Viable tissue/material including Exudate, Fibrin/Slough, and Subcutaneous after achieving pain control using Other (Lidocaine 4% cream). A time out was conducted prior to the start of the procedure. A Minimum amount of bleeding was controlled with Pressure. The procedure was tolerated well with a pain level of 0 throughout and a pain level of 0 following the procedure. Post Debridement Measurements: 0.7cm length x 0.4cm width x 0.3cm depth; 0.066cm^3 volume. Post debridement Stage noted as Category/Stage III. Post procedure Diagnosis Wound #1: Same as Pre-Procedure Plan Wound Cleansing: Wound #1 Right Gluteus: Cleanse wound with mild soap and water May Shower, gently pat wound dry prior to applying new dressing. Anesthetic: Wound #1 Right Gluteus: Topical Lidocaine 4% cream applied to wound bed prior to debridement Primary Wound Dressing: Wound #1 Right Gluteus: Aquacel Ag Secondary Dressing: Wound #1 Right Gluteus: Non-adherent pad Dressing Change Frequency: Wound #1 Right Gluteus: Change dressing every other day. Follow-up Appointments: [name], [name] ([id number]) Wound #1 Right Gluteus: Return Appointment in 1 week. Off-Loading: Wound #1 Right Gluteus: Turn and reposition every 2 hours Mattress Additional Orders / Instructions: Wound #1 Right Gluteus: Increase protein intake. Activity as tolerated Other: - Include Zinc, MVI and Vit C daily. After debriding her wound today I have recommended: 1. Offloading as much as possible and have discussed details of this. 2. Packing the wound with Aquacel Ag and applying an appropriate bordered foam dressing. 3. Appropriate air cushion for her chair as required. 4. vitamin supplements including vitamin C and zinc -- they will get this from the pharmacy 5. adequate protein intake 6. Regular  visits to the wound care center The patient and her husband have had all questions answered and will be seeing me next week. Electronic Signature(s) Signed: 06/08/2015 2:26:22 PM By: Christin Fudge MD, FACS Entered By: Christin Fudge on 06/08/2015 14:26:22 [name], [name] ([id number]) -------------------------------------------------------------------------------- SuperBill Details Patient Name: [name] Date of Service: 06/08/2015 Medical Record Number: [id number] Patient Account Number: [id number] Date of Birth/Sex: Mar 01, 1946 (69 y.o. Female) Treating RN: Carolyne Fiscal, Debi Primary Care Physician: Ronette Deter Other Clinician: Referring Physician: Ronette Deter Treating Physician/Extender: Frann Rider in Treatment: 2 Diagnosis Coding ICD-10 Codes Code Description [phone] Type 1 diabetes mellitus with other skin ulcer L89.313 Pressure ulcer of right buttock, stage 3 Z92.21 Personal history of antineoplastic chemotherapy Facility Procedures CPT4 Code: [id number] Description: [id number] - DEB SUBQ TISSUE 20 SQ CM/< ICD-10 Description Diagnosis E10.622 Type 1 diabetes mellitus with other skin ulcer L89.313 Pressure ulcer of right buttock, stage 3 Z92.21 Personal history of antineoplastic chemotherap Modifier: y Quantity: 1 Physician Procedures CPT4 Code: [id number] Description: [id number] - WC PHYS SUBQ TISS 20 SQ CM ICD-10 Description Diagnosis E10.622 Type 1 diabetes mellitus with other skin ulcer L89.313 Pressure ulcer of right buttock, stage 3 Z92.21 Personal history of antineoplastic chemotherap Modifier: y Quantity: 1 Electronic Signature(s) Signed: 06/08/2015 2:26:42 PM By: Christin Fudge MD, FACS Previous Signature: 06/08/2015 2:26:35 PM Version By: Christin Fudge MD, FACS Entered By: Christin Fudge on 06/08/2015 14:26:42

## 2015-06-09 NOTE — Telephone Encounter (Signed)
Called patient to ask how often she was checking her sugars and if she wanted to use [facility] to get her diabetic supplies.

## 2015-06-10 NOTE — Telephone Encounter (Signed)
Patient stated that she checks her blood sugars 4-5 times daily. She uses [facility] for her diabetic supplies. She get's her test strips from [employer]

## 2015-06-10 NOTE — Telephone Encounter (Signed)
Will update chart  

## 2015-06-12 ENCOUNTER — Inpatient Hospital Stay: Payer: Medicare Other

## 2015-06-12 ENCOUNTER — Other Ambulatory Visit: Payer: Self-pay | Admitting: Hematology and Oncology

## 2015-06-12 ENCOUNTER — Telehealth: Payer: Self-pay

## 2015-06-12 DIAGNOSIS — C561 Malignant neoplasm of right ovary: Secondary | ICD-10-CM | POA: Diagnosis not present

## 2015-06-12 DIAGNOSIS — C569 Malignant neoplasm of unspecified ovary: Secondary | ICD-10-CM

## 2015-06-12 LAB — BASIC METABOLIC PANEL
Anion gap: 9 (ref 5–15)
BUN: 20 mg/dL (ref 6–20)
CO2: 26 mmol/L (ref 22–32)
Calcium: 8.7 mg/dL — ABNORMAL LOW (ref 8.9–10.3)
Chloride: 96 mmol/L — ABNORMAL LOW (ref 101–111)
Creatinine, Ser: 0.65 mg/dL (ref 0.44–1.00)
GFR calc Af Amer: >60 mL/min (ref >60)
GFR calc non Af Amer: >60 mL/min (ref >60)
Glucose, Bld: 226 mg/dL — ABNORMAL HIGH (ref 65–99)
Potassium: 3.5 mmol/L (ref 3.5–5.1)
Sodium: 131 mmol/L — ABNORMAL LOW (ref 135–145)

## 2015-06-12 LAB — CBC
HCT: 29.2 % — ABNORMAL LOW (ref 35.0–47.0)
Hemoglobin: 10 g/dL — ABNORMAL LOW (ref 12.0–16.0)
MCH: 30 pg (ref 26.0–34.0)
MCHC: 34.1 g/dL (ref 32.0–36.0)
MCV: 87.9 fL (ref 80.0–100.0)
Platelets: 254 10*3/uL (ref 150–440)
RBC: 3.32 MIL/uL — ABNORMAL LOW (ref 3.80–5.20)
RDW: 19.2 % — ABNORMAL HIGH (ref 11.5–14.5)
WBC: 5.2 10*3/uL (ref 3.6–11.0)

## 2015-06-12 LAB — MAGNESIUM: Magnesium: 1.5 mg/dL — ABNORMAL LOW (ref 1.7–2.4)

## 2015-06-12 MED ORDER — SODIUM CHLORIDE 0.9 % IV SOLN
1.0000 g | Freq: Once | INTRAVENOUS | Status: AC
  Administered 2015-06-12: 1 g via INTRAVENOUS
  Filled 2015-06-12: qty 2

## 2015-06-12 MED ORDER — SODIUM CHLORIDE 0.9 % IV SOLN
INTRAVENOUS | Status: DC
  Administered 2015-06-12: 14:00:00 via INTRAVENOUS
  Filled 2015-06-12: qty 1000

## 2015-06-12 MED ORDER — HEPARIN SOD (PORK) LOCK FLUSH 100 UNIT/ML IV SOLN
INTRAVENOUS | Status: AC
  Filled 2015-06-12: qty 5

## 2015-06-12 MED ORDER — HEPARIN SOD (PORK) LOCK FLUSH 100 UNIT/ML IV SOLN
500.0000 [IU] | Freq: Once | INTRAVENOUS | Status: AC
  Administered 2015-06-12: 500 [IU] via INTRAVENOUS

## 2015-06-12 NOTE — Telephone Encounter (Signed)
Called pt per MD to inform pt that sodium was low to please continue to increase sodium intake.  Also informed pt of calcium being low, per pt she is taking 500 mg BID.  Also informed pt of increased glucose which she has a pump and monitors at home.  Pt verbalized an understanding and thanked me for my call.

## 2015-06-15 ENCOUNTER — Ambulatory Visit: Payer: Medicare Other | Admitting: Surgery

## 2015-06-15 HISTORY — PX: CHOLECYSTECTOMY: SHX55

## 2015-06-15 HISTORY — PX: ABDOMINAL HYSTERECTOMY: SHX81

## 2015-06-18 ENCOUNTER — Ambulatory Visit: Payer: Medicare Other

## 2015-06-19 DIAGNOSIS — E43 Unspecified severe protein-calorie malnutrition: Secondary | ICD-10-CM | POA: Insufficient documentation

## 2015-06-20 DIAGNOSIS — E46 Unspecified protein-calorie malnutrition: Secondary | ICD-10-CM | POA: Insufficient documentation

## 2015-06-24 ENCOUNTER — Ambulatory Visit: Payer: Medicare Other

## 2015-06-26 ENCOUNTER — Telehealth: Payer: Self-pay | Admitting: *Deleted

## 2015-06-26 NOTE — Telephone Encounter (Signed)
Voicemail ; [name] from [facility] would like clarity on colostomy bag orders.  She stated the orders are 2x a Week for six weeks, 1x a week for 3weeks.  Please advise No call back information

## 2015-06-26 NOTE — Telephone Encounter (Signed)
Spoke with the patient's husband, he is not sure who the person is from [place] that comes out, he gave me a main number for [place] [phone].  Called [facility] number and requested to speak with Abigail Butts, there is not a wendy on there staff, spoke with Bella Kennedy and she said that the intake nurse for patient was Ria Comment.  She took a message to have Ria Comment return my call.

## 2015-07-01 ENCOUNTER — Emergency Department: Payer: Medicare Other

## 2015-07-01 ENCOUNTER — Other Ambulatory Visit: Payer: Self-pay

## 2015-07-01 ENCOUNTER — Inpatient Hospital Stay
Admission: EM | Admit: 2015-07-01 | Discharge: 2015-07-03 | DRG: 187 | Disposition: A | Discharge status: Home / Self Care | Payer: Medicare Other | Attending: Specialist | Admitting: Specialist

## 2015-07-01 ENCOUNTER — Inpatient Hospital Stay: Payer: Medicare Other | Attending: Obstetrics and Gynecology | Admitting: Obstetrics and Gynecology

## 2015-07-01 ENCOUNTER — Encounter: Payer: Self-pay | Admitting: Emergency Medicine

## 2015-07-01 ENCOUNTER — Encounter: Payer: Self-pay | Admitting: Obstetrics and Gynecology

## 2015-07-01 VITALS — BP 128/62 | HR 90 | Temp 98.1°F | Resp 18 | Ht 63.0 in | Wt 153.2 lb

## 2015-07-01 DIAGNOSIS — K449 Diaphragmatic hernia without obstruction or gangrene: Secondary | ICD-10-CM | POA: Insufficient documentation

## 2015-07-01 DIAGNOSIS — M069 Rheumatoid arthritis, unspecified: Secondary | ICD-10-CM | POA: Insufficient documentation

## 2015-07-01 DIAGNOSIS — Z933 Colostomy status: Secondary | ICD-10-CM

## 2015-07-01 DIAGNOSIS — Z7982 Long term (current) use of aspirin: Secondary | ICD-10-CM | POA: Diagnosis not present

## 2015-07-01 DIAGNOSIS — C569 Malignant neoplasm of unspecified ovary: Secondary | ICD-10-CM | POA: Diagnosis present

## 2015-07-01 DIAGNOSIS — I110 Hypertensive heart disease with heart failure: Secondary | ICD-10-CM | POA: Diagnosis present

## 2015-07-01 DIAGNOSIS — Z885 Allergy status to narcotic agent status: Secondary | ICD-10-CM

## 2015-07-01 DIAGNOSIS — M7989 Other specified soft tissue disorders: Secondary | ICD-10-CM | POA: Insufficient documentation

## 2015-07-01 DIAGNOSIS — Z794 Long term (current) use of insulin: Secondary | ICD-10-CM | POA: Insufficient documentation

## 2015-07-01 DIAGNOSIS — Z79899 Other long term (current) drug therapy: Secondary | ICD-10-CM | POA: Diagnosis not present

## 2015-07-01 DIAGNOSIS — E785 Hyperlipidemia, unspecified: Secondary | ICD-10-CM | POA: Diagnosis present

## 2015-07-01 DIAGNOSIS — Z9221 Personal history of antineoplastic chemotherapy: Secondary | ICD-10-CM

## 2015-07-01 DIAGNOSIS — E1140 Type 2 diabetes mellitus with diabetic neuropathy, unspecified: Secondary | ICD-10-CM | POA: Diagnosis present

## 2015-07-01 DIAGNOSIS — R5383 Other fatigue: Secondary | ICD-10-CM | POA: Insufficient documentation

## 2015-07-01 DIAGNOSIS — C561 Malignant neoplasm of right ovary: Secondary | ICD-10-CM | POA: Diagnosis not present

## 2015-07-01 DIAGNOSIS — C786 Secondary malignant neoplasm of retroperitoneum and peritoneum: Secondary | ICD-10-CM | POA: Insufficient documentation

## 2015-07-01 DIAGNOSIS — R0600 Dyspnea, unspecified: Secondary | ICD-10-CM

## 2015-07-01 DIAGNOSIS — J910 Malignant pleural effusion: Secondary | ICD-10-CM | POA: Diagnosis present

## 2015-07-01 DIAGNOSIS — Z87891 Personal history of nicotine dependence: Secondary | ICD-10-CM | POA: Insufficient documentation

## 2015-07-01 DIAGNOSIS — Z90722 Acquired absence of ovaries, bilateral: Secondary | ICD-10-CM | POA: Insufficient documentation

## 2015-07-01 DIAGNOSIS — I2510 Atherosclerotic heart disease of native coronary artery without angina pectoris: Secondary | ICD-10-CM | POA: Diagnosis present

## 2015-07-01 DIAGNOSIS — E876 Hypokalemia: Secondary | ICD-10-CM | POA: Insufficient documentation

## 2015-07-01 DIAGNOSIS — I10 Essential (primary) hypertension: Secondary | ICD-10-CM | POA: Insufficient documentation

## 2015-07-01 DIAGNOSIS — I252 Old myocardial infarction: Secondary | ICD-10-CM

## 2015-07-01 DIAGNOSIS — G629 Polyneuropathy, unspecified: Secondary | ICD-10-CM

## 2015-07-01 DIAGNOSIS — Z951 Presence of aortocoronary bypass graft: Secondary | ICD-10-CM | POA: Diagnosis not present

## 2015-07-01 DIAGNOSIS — Z9641 Presence of insulin pump (external) (internal): Secondary | ICD-10-CM | POA: Diagnosis present

## 2015-07-01 DIAGNOSIS — R0902 Hypoxemia: Secondary | ICD-10-CM | POA: Diagnosis present

## 2015-07-01 DIAGNOSIS — J90 Pleural effusion, not elsewhere classified: Secondary | ICD-10-CM | POA: Insufficient documentation

## 2015-07-01 DIAGNOSIS — J9811 Atelectasis: Secondary | ICD-10-CM | POA: Diagnosis present

## 2015-07-01 DIAGNOSIS — R600 Localized edema: Secondary | ICD-10-CM

## 2015-07-01 DIAGNOSIS — Z90710 Acquired absence of both cervix and uterus: Secondary | ICD-10-CM

## 2015-07-01 DIAGNOSIS — R0602 Shortness of breath: Secondary | ICD-10-CM | POA: Diagnosis present

## 2015-07-01 DIAGNOSIS — I5032 Chronic diastolic (congestive) heart failure: Secondary | ICD-10-CM | POA: Insufficient documentation

## 2015-07-01 DIAGNOSIS — C562 Malignant neoplasm of left ovary: Secondary | ICD-10-CM | POA: Diagnosis not present

## 2015-07-01 DIAGNOSIS — Z8543 Personal history of malignant neoplasm of ovary: Secondary | ICD-10-CM | POA: Diagnosis not present

## 2015-07-01 DIAGNOSIS — Z9889 Other specified postprocedural states: Secondary | ICD-10-CM

## 2015-07-01 DIAGNOSIS — R630 Anorexia: Secondary | ICD-10-CM | POA: Insufficient documentation

## 2015-07-01 DIAGNOSIS — K219 Gastro-esophageal reflux disease without esophagitis: Secondary | ICD-10-CM | POA: Insufficient documentation

## 2015-07-01 DIAGNOSIS — E109 Type 1 diabetes mellitus without complications: Secondary | ICD-10-CM | POA: Insufficient documentation

## 2015-07-01 DIAGNOSIS — E1059 Type 1 diabetes mellitus with other circulatory complications: Secondary | ICD-10-CM

## 2015-07-01 LAB — BASIC METABOLIC PANEL
Anion gap: 9 (ref 5–15)
BUN: 16 mg/dL (ref 6–20)
CHLORIDE: 98 mmol/L — AB (ref 101–111)
CO2: 27 mmol/L (ref 22–32)
CREATININE: 0.69 mg/dL (ref 0.44–1.00)
Calcium: 8.8 mg/dL — ABNORMAL LOW (ref 8.9–10.3)
Glucose, Bld: 267 mg/dL — ABNORMAL HIGH (ref 65–99)
POTASSIUM: 3.6 mmol/L (ref 3.5–5.1)
SODIUM: 134 mmol/L — AB (ref 135–145)

## 2015-07-01 LAB — TROPONIN I: Troponin I: 0.04 ng/mL — ABNORMAL HIGH (ref <0.031)

## 2015-07-01 LAB — CBC
HCT: 29.3 % — ABNORMAL LOW (ref 35.0–47.0)
Hemoglobin: 9.9 g/dL — ABNORMAL LOW (ref 12.0–16.0)
MCH: 29.9 pg (ref 26.0–34.0)
MCHC: 33.8 g/dL (ref 32.0–36.0)
MCV: 88.5 fL (ref 80.0–100.0)
PLATELETS: 393 10*3/uL (ref 150–440)
RBC: 3.31 MIL/uL — AB (ref 3.80–5.20)
RDW: 16.3 % — AB (ref 11.5–14.5)
WBC: 5.4 10*3/uL (ref 3.6–11.0)

## 2015-07-01 LAB — GLUCOSE, CAPILLARY: Glucose-Capillary: 211 mg/dL — ABNORMAL HIGH (ref 65–99)

## 2015-07-01 LAB — BRAIN NATRIURETIC PEPTIDE: B NATRIURETIC PEPTIDE 5: 237 pg/mL — AB (ref 0.0–100.0)

## 2015-07-01 MED ORDER — SODIUM CHLORIDE 0.9% FLUSH
3.0000 mL | Freq: Two times a day (BID) | INTRAVENOUS | Status: DC
  Administered 2015-07-01 – 2015-07-02 (×3): 3 mL via INTRAVENOUS

## 2015-07-01 MED ORDER — ONDANSETRON HCL 4 MG PO TABS: 4.0000 mg | ORAL_TABLET | Freq: Four times a day (QID) | ORAL | Status: DC | PRN

## 2015-07-01 MED ORDER — PANTOPRAZOLE SODIUM 40 MG PO TBEC
40.0000 mg | DELAYED_RELEASE_TABLET | Freq: Every day | ORAL | Status: DC
  Administered 2015-07-02: 40 mg via ORAL
  Filled 2015-07-01 (×5): qty 1

## 2015-07-01 MED ORDER — SODIUM CHLORIDE 0.9 % IV SOLN: 250.0000 mL | INTRAVENOUS | Status: DC | PRN

## 2015-07-01 MED ORDER — ENOXAPARIN SODIUM 40 MG/0.4ML ~~LOC~~ SOLN: 40.0000 mg | Freq: Once | SUBCUTANEOUS | Status: DC

## 2015-07-01 MED ORDER — ADULT MULTIVITAMIN W/MINERALS CH
1.0000 | ORAL_TABLET | Freq: Every day | ORAL | Status: DC
  Administered 2015-07-02: 1 via ORAL
  Filled 2015-07-01 (×3): qty 1

## 2015-07-01 MED ORDER — METOPROLOL TARTRATE 25 MG PO TABS: 25.0000 mg | ORAL_TABLET | Freq: Two times a day (BID) | ORAL | Status: DC

## 2015-07-01 MED ORDER — SACCHAROMYCES BOULARDII 250 MG PO CAPS
250.0000 mg | ORAL_CAPSULE | ORAL | Status: DC | PRN
  Filled 2015-07-01: qty 1

## 2015-07-01 MED ORDER — ACETAMINOPHEN 325 MG PO TABS: 650.0000 mg | ORAL_TABLET | Freq: Four times a day (QID) | ORAL | Status: DC | PRN

## 2015-07-01 MED ORDER — VITAMIN D 1000 UNITS PO TABS
2000.0000 [IU] | ORAL_TABLET | Freq: Every day | ORAL | Status: DC
  Administered 2015-07-02: 2000 [IU] via ORAL
  Filled 2015-07-01 (×3): qty 2

## 2015-07-01 MED ORDER — ENOXAPARIN SODIUM 40 MG/0.4ML ~~LOC~~ SOLN: 40.0000 mg | SUBCUTANEOUS | Status: DC

## 2015-07-01 MED ORDER — ENOXAPARIN SODIUM 40 MG/0.4ML ~~LOC~~ SOLN
40.0000 mg | Freq: Once | SUBCUTANEOUS | Status: AC
  Administered 2015-07-01: 40 mg via SUBCUTANEOUS
  Filled 2015-07-01: qty 0.4

## 2015-07-01 MED ORDER — POTASSIUM CHLORIDE CRYS ER 10 MEQ PO TBCR
10.0000 meq | EXTENDED_RELEASE_TABLET | Freq: Two times a day (BID) | ORAL | Status: DC
  Administered 2015-07-01 – 2015-07-03 (×4): 10 meq via ORAL
  Filled 2015-07-01 (×5): qty 1

## 2015-07-01 MED ORDER — BISACODYL 5 MG PO TBEC: 5.0000 mg | DELAYED_RELEASE_TABLET | Freq: Every day | ORAL | Status: DC | PRN

## 2015-07-01 MED ORDER — ONDANSETRON HCL 4 MG/2ML IJ SOLN: 4.0000 mg | Freq: Four times a day (QID) | INTRAMUSCULAR | Status: DC | PRN

## 2015-07-01 MED ORDER — VITAMIN C 500 MG PO TABS
500.0000 mg | ORAL_TABLET | Freq: Every day | ORAL | Status: DC
  Filled 2015-07-01 (×3): qty 1

## 2015-07-01 MED ORDER — GABAPENTIN 300 MG PO CAPS
300.0000 mg | ORAL_CAPSULE | Freq: Every day | ORAL | Status: DC
  Administered 2015-07-03: 300 mg via ORAL
  Filled 2015-07-01 (×3): qty 1

## 2015-07-01 MED ORDER — FAMOTIDINE 20 MG PO TABS
10.0000 mg | ORAL_TABLET | Freq: Every day | ORAL | Status: DC
  Administered 2015-07-03: 10 mg via ORAL
  Filled 2015-07-01 (×3): qty 1

## 2015-07-01 MED ORDER — FUROSEMIDE 40 MG PO TABS
40.0000 mg | ORAL_TABLET | Freq: Two times a day (BID) | ORAL | Status: DC
  Administered 2015-07-02 – 2015-07-03 (×3): 40 mg via ORAL
  Filled 2015-07-01 (×4): qty 1

## 2015-07-01 MED ORDER — LORATADINE 10 MG PO TABS
10.0000 mg | ORAL_TABLET | Freq: Every day | ORAL | Status: DC
  Filled 2015-07-01 (×4): qty 1

## 2015-07-01 MED ORDER — LORAZEPAM 0.5 MG PO TABS: 0.5000 mg | ORAL_TABLET | Freq: Four times a day (QID) | ORAL | Status: DC | PRN

## 2015-07-01 MED ORDER — ACETAMINOPHEN 650 MG RE SUPP: 650.0000 mg | Freq: Four times a day (QID) | RECTAL | Status: DC | PRN

## 2015-07-01 MED ORDER — LOSARTAN POTASSIUM 50 MG PO TABS
100.0000 mg | ORAL_TABLET | Freq: Every day | ORAL | Status: DC
  Administered 2015-07-02 – 2015-07-03 (×2): 100 mg via ORAL
  Filled 2015-07-01 (×4): qty 2

## 2015-07-01 MED ORDER — PREGABALIN 75 MG PO CAPS
75.0000 mg | ORAL_CAPSULE | Freq: Two times a day (BID) | ORAL | Status: DC
Start: 2015-07-01 — End: 2015-07-03
  Administered 2015-07-01 – 2015-07-02 (×3): 75 mg via ORAL
  Filled 2015-07-01 (×5): qty 1

## 2015-07-01 MED ORDER — METOPROLOL TARTRATE 25 MG PO TABS
25.0000 mg | ORAL_TABLET | Freq: Two times a day (BID) | ORAL | Status: DC
  Administered 2015-07-02 – 2015-07-03 (×3): 25 mg via ORAL
  Filled 2015-07-01 (×5): qty 1

## 2015-07-01 MED ORDER — SODIUM CHLORIDE 0.9% FLUSH: 3.0000 mL | INTRAVENOUS | Status: DC | PRN

## 2015-07-01 MED ORDER — SENNOSIDES-DOCUSATE SODIUM 8.6-50 MG PO TABS: 1.0000 | ORAL_TABLET | Freq: Every evening | ORAL | Status: DC | PRN

## 2015-07-01 MED ORDER — AMLODIPINE BESYLATE 5 MG PO TABS
5.0000 mg | ORAL_TABLET | Freq: Every day | ORAL | Status: DC
  Administered 2015-07-02 – 2015-07-03 (×2): 5 mg via ORAL
  Filled 2015-07-01 (×3): qty 1

## 2015-07-01 MED ORDER — ONDANSETRON HCL 4 MG PO TABS: 8.0000 mg | ORAL_TABLET | Freq: Three times a day (TID) | ORAL | Status: DC | PRN

## 2015-07-01 MED ORDER — ZINC GLUCONATE 50 MG PO TABS
50.0000 mg | ORAL_TABLET | Freq: Every day | ORAL | Status: DC
  Filled 2015-07-01 (×2): qty 1

## 2015-07-01 MED ORDER — MAGNESIUM OXIDE 400 (241.3 MG) MG PO TABS
400.0000 mg | ORAL_TABLET | Freq: Every day | ORAL | Status: DC
  Filled 2015-07-01 (×6): qty 1

## 2015-07-01 MED ORDER — ATORVASTATIN CALCIUM 20 MG PO TABS
80.0000 mg | ORAL_TABLET | Freq: Every day | ORAL | Status: DC
  Administered 2015-07-01: 80 mg via ORAL
  Filled 2015-07-01 (×4): qty 4

## 2015-07-01 NOTE — Progress Notes (Signed)
Gynecologic Oncology Consult Visit   Referring Provider: Lequita Asal, MD  Additional Care Provider: Dr. Harvest Dark.  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    Subjective:  [name] is a 70 y.o. 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  On 06/15/2015 she underwent diagnostic laparoscopy, exploratory laparotomy, lysis of adhesions (including enterolysis for > 45 minutes), total abdominal hysterectomy with bilateral salpingo-oophorectomy, infracolic omentectomy, optimal tumor debulking, recto-sigmoid resection with creation of end colostomy, mobilization of the splenic flexure  mobilization of the liver with diaphragmatic stripping and resection of diaphragm due to disease, repair of diaphragmatic defect and cholecystectomy. Her postoperative course was unremarkable. She presents for her postop check and complains of 5 days of dyspnea. She contacted [facility] triage and the nurse recommended she go to the ER.   She complains of fatigue, weakness, dyspnea, abdominal bloating/distention, leg swelling, and peripheral neuropathy.   PATHOLOGY: DIAGNOSIS  A. Uterus, bilateral ovaries and fallopian tubes, hysterectomy and bilateral salpingo-oophorectomy:  Left ovary: Residual high grade serous carcinoma, 2.5 cm. Right ovary: Residual high grade serous carcinoma, 1.5 cm.  Right and left fallopian tubes, free of tumor. See comment.  Uterus:  Residual high grade serous carcinoma involving uterine serosa and myometrium. Endometrium: Atrophic endometrium Cervix: No pathologic diagnosis.   See synoptic report for additional details.  Comment: Immunostains performed on block A17 ( right tube) show a small segment of p53 positive tubal epithelium. However, this focus of epithelium is cytological  unremarkable and shows no increase in Ki-67 labelling or P16 staining. It is therefore classified as a p53 signature lesion rather than a serous tubal intraepithelial carcinoma.    B. Rectomsigmoid colon and partial left ovary, resection:   Residual high grade serous carcinoma involving colonic serosa and subserosa, fallopian tube and ovary.   C. Omentum, omentectomy:   Residual high grade serous carcinoma, tumor size > 2 cm.   D. Perihepatic tumor, excision:  Residual high grade serous carcinoma.   E. Gallbladder, cholecystectomy:  Chronic cholecystitis and cholelithiasis. Negative for malignancy.   F. Diaphragm, excision:  Residual high grade serous carcinoma, > 2 cm  G. Omentum #2, excision:   Residual high grade serous carcinoma.      HPI: The patient presented to the emergency room at [facility] on 02/13/2015 with a 2-3 week history of diffuse abdominal discomfort and bloating.    Abdomen and pelvic CT scan on 02/13/2015 revealed bilateral mass-like adnexal regions. The right adnexal region measured approximately 5.6 x 5.0 cm posterior to the uterus, and the left adnexa demonstrated a large mass measuring approximately 10.3 x 6.0 x 9.9 cm, with heterogeneous internal areas of enhancement and cystic change. This appeared intimately associated with the adjacent proximal sigmoid colon. There was a large amount of soft tissue throughout the peritoneal cavity involving the omentum and other peritoneal surfaces, compatible with widespread intraperitoneal metastatic disease. There was a small volume of presumably malignant ascites. There was a peripheral low-attenuation lesion overlying the right lobe of the liver measuring approximately 3.3 x 1.9 cm, favored to represent a serosal implant. There was a similar appearing 1.4 x 2.2 cm ill-defined peripheral lesion within the inferior aspect of segment 6 adjacent to the ampulla in the duodenum. There was no lytic or  blastic lesions. She had a chronic T12 compression fracture.  She was seen in consultation by  Dr Theora Gianotti from Dr. Mike Gip for bilateral ovarian masses with associated peritoneal metastatic disease.   02/23/2015 CT biopsy high grade serous carcinoma.  Decision was made for neoadjuvant chemotherapy with carboplatin/taxol. She has had 4 cycles of carboplatin (AUC 5) /taxol (175 mg per sq M) with Dr Mike Gip, last 12/19.  CA125 dropped from 707 in 10/16 to 168.8 in 05/15/2015.   She stopped MTX and Enbarel she had been taking for rheumatoid arthritis while on chemotherapy and joint symptoms have been in good control.  Uses Prednisone 3m qd prn and has taken this the last three days.   CT scan 04/27/16 Heterogeneous left adnexal mass measures approximately 4.0 x 6.1 cm, previously approximately 5.0 x 8.5 cm when remeasured. Heterogeneous right ovary measures approximately 3.8 x 4.9 cm, roughly 4.7 x 5.6 cm on the prior exam. Uterus is visualized. Other: Omental caking has improved in the interval but has not fully resolved. Residual scattered areas of omental nodularity and thickening persist. Small ascites, decreased.   Hepatobiliary: Low-attenuation lesion in the periphery of the hepatic dome measures 3.1 cm, stable. A lesion previously seen off the posterior right hepatic lobe is not readily visualized. There is a small amount of fluid in Morison's pouch. Numerous stones are seen in the gallbladder. No biliary ductal dilatation.  IMPRESSION: 1. Interval response to therapy as evidenced by decrease in size of bilateral ovarian masses and improved, but not resolved, peritoneal carcinomatosis. 2. Hepatic dome lesion is unchanged. Previously seen right hepatic lobe lesion is not well-visualized. 3. Small ascites and small left pleural effusion. Trace right pleural effusion. 4. Cholelithiasis. 5. Nodular lesion at the ampulla of Vater, extending into the duodenum, stable.   Problem List: Patient  Active Problem List   Diagnosis Date Noted  . Decubitus ulcer 05/19/2015  . Hyponatremia 05/15/2015  . Neuropathy ([facility] 05/15/2015  . Ovarian cancer ([place] 05/06/2015  . Bilateral lower extremity edema 03/30/2015  . Hypomagnesemia 03/05/2015  . Diarrhea 02/26/2015  . Peritoneal carcinomatosis ([place] 02/17/2015  . Anemia 02/17/2015  . Adnexal mass 02/13/2015  . Angina pectoris associated with type 2 diabetes mellitus ([facility] 02/10/2015  . History of esophageal stricture 07/08/2014  . Dysphagia, pharyngoesophageal phase 07/08/2014  . Candida esophagitis ([place] 05/15/2014  . Cataracts, bilateral 01/13/2014  . Medicare annual wellness visit, subsequent 09/20/2013  . CAD (coronary artery disease) of artery bypass graft 11/05/2012  . Anemia, iron deficiency 10/09/2012  . CAD (coronary artery disease) 10/01/2012  . Hearing loss 07/06/2011  . Diabetes type 1, controlled ([facility] 07/06/2011  . Gastroesophageal reflux disease with stricture 07/06/2011  . GERD (gastroesophageal reflux disease) 04/05/2011  . Osteoporosis, unspecified 04/05/2011  . Hypertension 01/10/2011  . Hyperlipidemia 01/10/2011  . RA (rheumatoid arthritis) ([place] 01/10/2011   Past Medical History: Past Medical History  Diagnosis Date  . Leukopenia 2012    s/p bone marrow biopsy, Dr. PMa Hillock . Cholelithiasis   . GERD (gastroesophageal reflux disease)   . Hypertension   . Hyperlipidemia   . Esophageal stricture   . Chronic diastolic CHF (congestive heart failure) ([place]   . CAD (coronary artery disease)     a. 09/2012 Cath: LM nl, LAD 95p, 728mLCX 9073mM2 50, RCA 100.  . HMarland Kitchenpokalemia   . Herniated disc   . Pneumonia 2013; 08/2012    "one lung; double" (10/18/2012)  . Exertional shortness of breath   . Type I diabetes mellitus ([facility]   "dx'd in 1957" (10/18/2012)  . History of pancytopenia   .  H/O hiatal hernia   . Rheumatoid arthritis(714.0)   . NSTEMI (non-ST elevated myocardial infarction) ([place]) 08/2012    "mild"  (10/18/2012)  . Collagen vascular disease ([place])   . Ovarian cancer [facility])     Past Surgical History: Past Surgical History  Procedure Laterality Date  . Esophagogastroduodenoscopy  2012    Dr. Minna Merritts  . Tubal ligation  1970  . Cataract extraction w/ intraocular lens implant Right 2010  . Cardiac catheterization  10/18/2012    "first one was today" (10/18/2012)  . Esophageal dilation      "3 or 4 times" (10/19/2011)  . Coronary artery bypass graft N/A 10/19/2012    Procedure: CORONARY ARTERY BYPASS GRAFTING (CABG);  Surgeon: Melrose Nakayama, MD;  Location: [place];  Service: Open Heart Surgery;  Laterality: N/A;  . Peripheral vascular catheterization N/A 03/02/2015    Procedure: Porta Cath Insertion;  Surgeon: Algernon Huxley, MD;  Location: [place] [facility];  Service: Cardiovascular;  Laterality: N/A;  . Abdominal hysterectomy  06/15/2015    Dx L/S, EXLAP TAH BSO omentectomy RSRx colostomy diaphragm resection stripping  . Cholecystectomy  06/15/2015    combined case with ovarian cancer debulking    Past Gynecologic History:  Postmenopausal She has not had a GYN evaluation since age 62.    OB History:  G3P3  1. SVD - neonatal death 2. SVD - singleton 3. SVD - twins  Family History: Family History  Problem Relation Age of Onset  . Diabetes Mother   . Arthritis Mother   . Diabetes Father   . Arthritis Father   . Bone cancer Sister     Social History: Social History   Social History  . Marital Status: Married    Spouse Name: N/A  . Number of Children: 3  . Years of Education: N/A   Occupational History  . Retired    Social History Main Topics  . Smoking status: Former Smoker -- 1.00 packs/day for 30 years    Types: Cigarettes    Quit date: 06/11/1994  . Smokeless tobacco: Never Used  . Alcohol Use: No  . Drug Use: No  . Sexual Activity: No   Other Topics Concern  . Not on file   Social History Narrative    Allergies: Allergies  Allergen Reactions   . Codeine     nausea    Current Medications: Current Outpatient Prescriptions  Medication Sig Dispense Refill  . amLODipine (NORVASC) 5 MG tablet TAKE 1 TABLET BY MOUTH ONCE A DAY 90 tablet 2  . atorvastatin (LIPITOR) 80 MG tablet Take 1 tablet (80 mg total) by mouth daily. 90 tablet 3  . Cholecalciferol (VITAMIN D) 2000 UNITS CAPS Take 1 capsule by mouth daily.    Marland Kitchen enoxaparin (LOVENOX) 40 MG/0.4ML injection Inject 1 mg into the skin daily.    . furosemide (LASIX) 40 MG tablet Take 1 tablet (40 mg total) by mouth 2 (two) times daily. 180 tablet 3  . gabapentin (NEURONTIN) 300 MG capsule Take 1 capsule by mouth daily.    Marland Kitchen ibuprofen (ADVIL,MOTRIN) 800 MG tablet Take 1 tablet (800 mg total) by mouth 3 (three) times daily as needed. 270 tablet 3  . insulin lispro (HUMALOG) 100 UNIT/ML injection As directed via Insulin pump 30 mL 5  . lidocaine-prilocaine (EMLA) cream Apply 1 application topically as needed. Apply application 1 hour prior to treatment.  Cover with Press n seal until treatment time. 30 g 1  . loratadine (CLARITIN) 10 MG  tablet Take 10 mg by mouth daily.    Marland Kitchen LORazepam (ATIVAN) 0.5 MG tablet Take 1 tablet (0.5 mg total) by mouth every 6 (six) hours as needed (nausea). 30 tablet 0  . losartan (COZAAR) 100 MG tablet Take 1 tablet (100 mg total) by mouth daily. 90 tablet 3  . metoprolol tartrate (LOPRESSOR) 25 MG tablet Take 1 tablet (25 mg total) by mouth 2 (two) times daily as needed. 180 tablet 3  . Multiple Vitamin (MULTIVITAMIN) tablet Take 1 tablet by mouth daily.      . ondansetron (ZOFRAN) 8 MG tablet Take 1 tablet (8 mg total) by mouth every 8 (eight) hours as needed for nausea or vomiting. 20 tablet 1  . pantoprazole (PROTONIX) 40 MG tablet Take 1 tablet (40 mg total) by mouth 2 (two) times daily. (Patient taking differently: Take 40 mg by mouth daily. ) 180 tablet 3  . potassium chloride SA (K-DUR,KLOR-CON) 10 MEQ tablet Take 1 tablet (10 mEq total) by mouth 2 (two) times  daily. 20 tablet 0  . ranitidine (ZANTAC) 150 MG tablet Take 1 tablet (150 mg total) by mouth 2 (two) times daily. 180 tablet 2  . saccharomyces boulardii (FLORASTOR) 250 MG capsule Take 250 mg by mouth as needed. Reported on 05/06/2015    . Wound Dressings (MEDIHONEY CA ALGINATE 2"X2") PADS Apply to sacral wound daily. 10 each 3  . zoledronic acid (RECLAST) 5 MG/100ML SOLN Inject 5 mg into the vein once.    Marland Kitchen aspirin 81 MG tablet Take 81 mg by mouth daily. Reported on 07/01/2015    . dexamethasone (DECADRON) 4 MG tablet 20 mg (5 tablets) 12 hours and 6 hours prior to chemotherapy (Patient not taking: Reported on 07/01/2015) 30 tablet 1  . etanercept (ENBREL) 25 MG injection Inject 25 mg into the skin once a week. Reported on 07/01/2015    . folic acid (FOLVITE) 1 MG tablet Take 1 tablet (1 mg total) by mouth daily. (Patient not taking: Reported on 07/01/2015) 90 tablet 3  . magnesium oxide (MAG-OX) 400 MG tablet Take 400 mg by mouth daily. Reported on 07/01/2015    . methotrexate (RHEUMATREX) 2.5 MG tablet Take 4 tablets (10 mg total) by mouth once a week. Takes every Wednesday.Caution:Chemotherapy. Protect from light. (Patient not taking: Reported on 07/01/2015) 64 tablet 3  . predniSONE (DELTASONE) 5 MG tablet Take 1 tablet by mouth daily as needed.    . pregabalin (LYRICA) 75 MG capsule Take 75 mg by mouth 2 (two) times daily. Reported on 07/01/2015    . vitamin C (ASCORBIC ACID) 500 MG tablet Take 500 mg by mouth daily. Reported on 07/01/2015    . zinc gluconate 50 MG tablet Take 50 mg by mouth daily. Reported on 07/01/2015     No current facility-administered medications for this visit.   Facility-Administered Medications Ordered in Other Visits  Medication Dose Route Frequency Provider Last Rate Last Dose  . sodium chloride 0.9 % injection 10 mL  10 mL Intracatheter PRN Rosey Bath, MD      . sodium chloride 0.9 % injection 10 mL  10 mL Intravenous PRN Rosey Bath, MD   10 mL at 04/13/15 5702     Review of Systems General: fatigue and weakness Skin: negative for changes in color, texture, moles or lesions HEENT: negative for, change in hearing, pain, discharge, tinnitus, vertigo, voice changes, sore throat, neck masses Pulmonary: dyspnea increasing over 5 days Cardiac: negative for pain Gastrointestinal: distension but no  n/v and ostomy is functioning well Genitourinary/Sexual: negative for, dysuria, hesitancy, nocturia, incontinence Ob/Gyn: negative for, irregular bleeding, pain Musculoskeletal: swelling feet for a couple of weeks Hematology: negative for, easy bruising, bleeding Neuro: peripheral neuropathy symptoms  Objective:  Physical Examination:  BP 128/62 mmHg  Pulse 90  Temp(Src) 98.1 F (36.7 C) (Oral)  Resp 18  Ht _0  (1.6 m)  Wt 153 lb 3.5 oz (69.5 kg)  BMI 27.15 kg/m2  SpO2 93%  Body mass index is 27.15 kg/(m^2).  ECOG Performance Status: 2 - Symptomatic, <50% confined to bed  General appearance: alert, cooperative and appears stated age, pale HEENT:PERRLA, extra ocular movement intact and sclera clear, anicteric, mucous membranes moist Lymph node survey: non-palpable, axillary, inguinal, supraclavicular Cardiovascular: regular rate and rhythm Respiratory: normal air entry, lungs clear to auscultation but decreased breath sounds right 1/2 posterior lung fields Abdomen: soft, nondistended and nontendder, no mass/ascites/hernias. Ostomy bag intact Back: inspection of back is normal Extremities: extremities atraumatic, no cyanosis but bilateral 3+ edema Neurological exam reveals alert, oriented, normal speech, no focal findings or movement disorder noted.  Pelvic: deferred and prior decubitus ulcer not evaluated.   Lab Review Lab Results  Component Value Date   CA125 168.8* 05/15/2015     Lab Results  Component Value Date   WBC 5.2 06/12/2015   HGB 10.0* 06/12/2015   HCT 29.2* 06/12/2015   MCV 87.9 06/12/2015   PLT 254 06/12/2015      Chemistry      Component Value Date/Time   NA 131* 06/12/2015 1336   NA 134* 09/27/2012 0459   K 3.5 06/12/2015 1336   K 4.5 09/27/2012 0459   CL 96* 06/12/2015 1336   CL 100 09/27/2012 0459   CO2 26 06/12/2015 1336   CO2 28 09/27/2012 0459   BUN 20 06/12/2015 1336   BUN 8 09/27/2012 0459   CREATININE 0.65 06/12/2015 1336   CREATININE 0.60 09/27/2012 0459      Component Value Date/Time   CALCIUM 8.7* 06/12/2015 1336   CALCIUM 8.5 09/27/2012 0459   ALKPHOS 59 05/21/2015 0839   ALKPHOS 100 09/21/2012 1210   AST 20 05/21/2015 0839   AST 36 09/21/2012 1210   ALT 20 05/21/2015 0839   ALT 23 09/21/2012 1210   BILITOT 0.5 05/21/2015 0839   BILITOT 0.7 09/21/2012 1210         Assessment:  [name] is a 70 y.o. 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   Dyspnea with decrease O2 saturation may be secondary to pleural effusion, edema, PE. Bilateral lower extremity swelling most likely secondary to fluid overload, but differential diagnosis also includes DVT. Exam concerning for anemia given pallor and h/o fatigue. Clinically stable.    Medical co-morbidities complicating care: HTN, insulin dependent diabetes with SQ infusion pump, CAD, rheumatoid arthritis, asymptomatic gall stones.   Plan:   Problem List Items Addressed This Visit      Genitourinary   Ovarian cancer ([place])   Relevant  Medications   gabapentin (NEURONTIN)  300 MG capsule   predniSONE (DELTASONE) 5 MG tablet    Other Visit Diagnoses    Dyspnea    -  Primary      Transfer to ER for further evaluation. May consider spiral CT, EKG, BLE doppers, and labs. We contacted the ER to alert them regarding the patient's symptoms. Ms. Berkland agrees with the plan. We will follow up findings.   Gillis Ends, MD  CC:   Referring Provider: Lequita Asal, MD  Additional Care Provider: Dr. Harvest Dark. Dr. Percell Boston

## 2015-07-01 NOTE — ED Notes (Signed)
Patient transported to X-ray 

## 2015-07-01 NOTE — ED Provider Notes (Signed)
[facility] Emergency Department Provider Note   ____________________________________________  Time seen: Approximately 3:35pm I have reviewed the triage vital signs and the triage nursing note.  HISTORY  Chief Complaint Shortness of Breath   Historian Patient  HPI [name] is a 70 y.o. female who recently released from [place] g[facility] onc for surgery for metastatic ovarian cancer - hysterectomy, colectomy with colostomy, pleural and lung resection R, who is here for shortness of breath.  Saw Dr. Theora Gianotti gyn onc in clinic who sent to ED for eval.  In hospital had "900" removed from right lung thoracentesis.  Was not on o2 at home.  No fevers, no cough, no wheezing, no chest pain.  Worse sob with walking    Past Medical History  Diagnosis Date  . Leukopenia 2012    s/p bone marrow biopsy, Dr. Ma Hillock  . Cholelithiasis   . GERD (gastroesophageal reflux disease)   . Hypertension   . Hyperlipidemia   . Esophageal stricture   . Chronic diastolic CHF (congestive heart failure) ([place])   . CAD (coronary artery disease)     a. 09/2012 Cath: LM nl, LAD 95p, 56m LCX 957mOM2 50, RCA 100.  . Marland Kitchenypokalemia   . Herniated disc   . Pneumonia 2013; 08/2012    "one lung; double" (10/18/2012)  . Exertional shortness of breath   . Type I diabetes mellitus ([facility]    "dx'd in 1957" (10/18/2012)  . History of pancytopenia   . H/O hiatal hernia   . Rheumatoid arthritis(714.0)   . NSTEMI (non-ST elevated myocardial infarction) (HCT[place]5/2014    "mild" (10/18/2012)  . Collagen vascular disease ([facility]  . [facility]    Patient Active Problem List   Diagnosis Date Noted  . Decubitus ulcer 05/19/2015  . Hyponatremia 05/15/2015  . Neuropathy ([facility]01/13/2017  . Ovarian cancer (HC[facility]01/08/2015  . Bilateral lower extremity edema 03/30/2015  . Hypomagnesemia 03/05/2015  . Diarrhea 02/26/2015  . Peritoneal carcinomatosis (HC[place]10/18/2016  . Anemia 02/17/2015  . Adnexal  mass 02/13/2015  . Angina pectoris associated with type 2 diabetes mellitus (HCFalconer10/03/2015  . History of esophageal stricture 07/08/2014  . Dysphagia, pharyngoesophageal phase 07/08/2014  . Candida esophagitis (HCPenn Yan01/14/2016  . Cataracts, bilateral 01/13/2014  . Medicare annual wellness visit, subsequent 09/20/2013  . CAD (coronary artery disease) of artery bypass graft 11/05/2012  . Anemia, iron deficiency 10/09/2012  . CAD (coronary artery disease) 10/01/2012  . Hearing loss 07/06/2011  . Diabetes type 1, controlled (HCUnion[place]03/10/2011  . Gastroesophageal reflux disease with stricture 07/06/2011  . GERD (gastroesophageal reflux disease) 04/05/2011  . Osteoporosis, unspecified 04/05/2011  . Hypertension 01/10/2011  . Hyperlipidemia 01/10/2011  . RA (rheumatoid arthritis) (HCHypoluxo09/01/2011    Past Surgical History  Procedure Laterality Date  . Esophagogastroduodenoscopy  2012    Dr. IfMinna Merritts. Tubal ligation  1970  . Cataract extraction w/ intraocular lens implant Right 2010  . Cardiac catheterization  10/18/2012    "first one was today" (10/18/2012)  . Esophageal dilation      "3 or 4 times" (10/19/2011)  . Coronary artery bypass graft N/A 10/19/2012    Procedure: CORONARY ARTERY BYPASS GRAFTING (CABG);  Surgeon: StMelrose NakayamaMD;  [facility] Service: Open [facility] Surgery;  Laterality: N/A;  . Peripheral vascular catheterization N/A 03/02/2015    Procedure: Porta Cath Insertion;  Surgeon: JaAlgernon HuxleyMD;  [place] [facility];  Service: Cardiovascular;  Laterality: N/A;  . Abdominal hysterectomy  06/15/2015    Dx L/S, EXLAP TAH BSO omentectomy RSRx colostomy diaphragm resection stripping  . Cholecystectomy  06/15/2015    combined case with ovarian cancer debulking    Current Outpatient Rx  Name  Route  Sig  Dispense  Refill  . amLODipine (NORVASC) 5 MG tablet      TAKE 1 TABLET BY MOUTH ONCE A DAY   90 tablet   2   . aspirin 81 MG tablet   Oral    Take 81 mg by mouth daily. Reported on 07/01/2015         . atorvastatin (LIPITOR) 80 MG tablet   Oral   Take 1 tablet (80 mg total) by mouth daily.   90 tablet   3   . Cholecalciferol (VITAMIN D) 2000 UNITS CAPS   Oral   Take 1 capsule by mouth daily.         Marland Kitchen dexamethasone (DECADRON) 4 MG tablet      20 mg (5 tablets) 12 hours and 6 hours prior to chemotherapy Patient not taking: Reported on 07/01/2015   30 tablet   1   . enoxaparin (LOVENOX) 40 MG/0.4ML injection   Subcutaneous   Inject 1 mg into the skin daily.         Marland Kitchen etanercept (ENBREL) 25 MG injection   Subcutaneous   Inject 25 mg into the skin once a week. Reported on 07/08/4663         . folic acid (FOLVITE) 1 MG tablet   Oral   Take 1 tablet (1 mg total) by mouth daily. Patient not taking: Reported on 07/01/2015   90 tablet   3   . furosemide (LASIX) 40 MG tablet   Oral   Take 1 tablet (40 mg total) by mouth 2 (two) times daily.   180 tablet   3   . gabapentin (NEURONTIN) 300 MG capsule   Oral   Take 1 capsule by mouth daily.         Marland Kitchen ibuprofen (ADVIL,MOTRIN) 800 MG tablet   Oral   Take 1 tablet (800 mg total) by mouth 3 (three) times daily as needed.   270 tablet   3   . insulin lispro (HUMALOG) 100 UNIT/ML injection      As directed via Insulin pump   30 mL   5   . lidocaine-prilocaine (EMLA) cream   Topical   Apply 1 application topically as needed. Apply application 1 hour prior to treatment.  Cover with Press n seal until treatment time.   30 g   1   . loratadine (CLARITIN) 10 MG tablet   Oral   Take 10 mg by mouth daily.         Marland Kitchen LORazepam (ATIVAN) 0.5 MG tablet   Oral   Take 1 tablet (0.5 mg total) by mouth every 6 (six) hours as needed (nausea).   30 tablet   0   . losartan (COZAAR) 100 MG tablet   Oral   Take 1 tablet (100 mg total) by mouth daily.   90 tablet   3   . magnesium oxide (MAG-OX) 400 MG tablet   Oral   Take 400 mg by mouth daily. Reported on  07/01/2015         . methotrexate (RHEUMATREX) 2.5 MG tablet   Oral   Take 4 tablets (10 mg total) by mouth once a week. Takes every Wednesday.Caution:Chemotherapy. Protect from light. Patient  not taking: Reported on 07/01/2015   64 tablet   3   . metoprolol tartrate (LOPRESSOR) 25 MG tablet   Oral   Take 1 tablet (25 mg total) by mouth 2 (two) times daily as needed.   180 tablet   3   . Multiple Vitamin (MULTIVITAMIN) tablet   Oral   Take 1 tablet by mouth daily.           . ondansetron (ZOFRAN) 8 MG tablet   Oral   Take 1 tablet (8 mg total) by mouth every 8 (eight) hours as needed for nausea or vomiting.   20 tablet   1   . pantoprazole (PROTONIX) 40 MG tablet   Oral   Take 1 tablet (40 mg total) by mouth 2 (two) times daily. Patient taking differently: Take 40 mg by mouth daily.    180 tablet   3   . potassium chloride SA (K-DUR,KLOR-CON) 10 MEQ tablet   Oral   Take 1 tablet (10 mEq total) by mouth 2 (two) times daily.   20 tablet   0     Take 10 meq BID x 3 days then 10 meq Daily.   . predniSONE (DELTASONE) 5 MG tablet   Oral   Take 1 tablet by mouth daily as needed.         . pregabalin (LYRICA) 75 MG capsule   Oral   Take 75 mg by mouth 2 (two) times daily. Reported on 07/01/2015         . ranitidine (ZANTAC) 150 MG tablet   Oral   Take 1 tablet (150 mg total) by mouth 2 (two) times daily.   180 tablet   2   . saccharomyces boulardii (FLORASTOR) 250 MG capsule   Oral   Take 250 mg by mouth as needed. Reported on 05/06/2015         . vitamin C (ASCORBIC ACID) 500 MG tablet   Oral   Take 500 mg by mouth daily. Reported on 07/01/2015         . Wound Dressings (MEDIHONEY CA ALGINATE 2"X2") PADS      Apply to sacral wound daily.   10 each   3   . zinc gluconate 50 MG tablet   Oral   Take 50 mg by mouth daily. Reported on 07/01/2015         . zoledronic acid (RECLAST) 5 MG/100ML SOLN   Intravenous   Inject 5 mg into the vein once.            Allergies Codeine  Family History  Problem Relation Age of Onset  . Diabetes Mother   . Arthritis Mother   . Diabetes Father   . Arthritis Father   . Bone cancer Sister     Social History Social History  Substance Use Topics  . Smoking status: Former Smoker -- 1.00 packs/day for 30 years    Types: Cigarettes    Quit date: 06/11/1994  . Smokeless tobacco: Never Used  . Alcohol Use: No    Review of Systems  Constitutional: Negative for fever. Eyes: Negative for visual changes. ENT: Negative for sore throat. Cardiovascular: Negative for chest pain. Respiratory: Positive for shortness of breath. Gastrointestinal: Negative for abdominal pain, vomiting and diarrhea. Genitourinary: Negative for dysuria. Musculoskeletal: Negative for back pain. Skin: Negative for rash. Neurological: Negative for headache. 10 point Review of Systems otherwise negative ____________________________________________   PHYSICAL EXAM:  VITAL SIGNS: ED Triage Vitals  Enc Vitals Group  BP 07/01/15 1435 133/48 mmHg     Pulse Rate 07/01/15 1435 93     Resp 07/01/15 1435 22     Temp 07/01/15 1435 98.3 F (36.8 C)     Temp Source 07/01/15 1435 Oral     SpO2 07/01/15 1429 90 %     Weight 07/01/15 1435 152 lb 1.9 oz (69 kg)     Height 07/01/15 1435 5' 3"  (1.6 m)     Head Cir --      Peak Flow --      Pain Score --      Pain Loc --      Pain Edu? --      Excl. in [place]? --      Constitutional: Alert and oriented. Well appearing and in no distress. HEENT   Head: Normocephalic and atraumatic.      Eyes: Conjunctivae are normal. PERRL. Normal extraocular movements.      Ears:         Nose: No congestion/rhinnorhea.   Mouth/Throat: Mucous membranes are moist.   Neck: No stridor. Cardiovascular/Chest: Normal rate, regular rhythm.  No murmurs, rubs, or gallops. Respiratory: Normal respiratory effort without tachypnea nor retractions. Decreased air movement bilateral right base  much worse. Gastrointestinal: Soft. No distention, no guarding, no rebound. Nontender.  Midline incision healing.  Colostomy. Genitourinary/rectal:Deferred Musculoskeletal: Nontender with normal range of motion in all extremities. No joint effusions.  No lower extremity tenderness.  2 plus pitting edema bilateral Neurologic:  Normal speech and language. No gross or focal neurologic deficits are appreciated. Skin:  Skin is warm, dry and intact. No rash noted. Psychiatric: Mood and affect are normal. Speech and behavior are normal. Patient exhibits appropriate insight and judgment.  ____________________________________________   EKG I, Lisa Roca, MD, the attending physician have personally viewed and interpreted all ECGs.  90 beats are noted. Normal sinus rhythm with PVCs, trigeminy. Normal axis. Narrow QRS. Nonspecific T-wave ____________________________________________  LABS (pertinent positives/negatives)  White blood count 5.4, hemoglobin 9.9, platelet count 500 Basic metabolic panel without significant abnormalities Troponin 0.04 BNP 237  ____________________________________________  RADIOLOGY All Xrays were viewed by me. Imaging interpreted by Radiologist.  Chest two-view:  IMPRESSION: Large right pleural effusion with consolidation in the right middle and lower lobes, probably enlarged part due to compressive atelectasis. __________________________________________  PROCEDURES  Procedure(s) performed: None  Critical Care performed: None  ____________________________________________   ED COURSE / ASSESSMENT AND PLAN  Pertinent labs & imaging results that were available during my care of the patient were reviewed by me and considered in my medical decision making (see chart for details).    Patient's here with hypoxia reported 84-86% at home. Here she was documented 87% on room air. 2 L 94%.  Lung sounds without wheezing, but decreased especially right.  This is the side that she had a previous pleural effusion.  No symptoms of pneumonia including no cough, or fevers.  Chest x-ray confirms large right-sided pleural effusion. Patient will be admitted for hypoxia with right-sided pleural effusion and she will likely need fluid drained.    CONSULTATIONS:   Dr. Serita Grit, Hospitalist for admission.   Patient / Family / Caregiver informed of clinical course, medical decision-making process, and agree with plan.     ___________________________________________   FINAL CLINICAL IMPRESSION(S) / ED DIAGNOSES   Final diagnoses:  Recurrent right pleural effusion  Hypoxia              Note: This dictation was prepared with  Sales executive. Any transcriptional errors that result from this process are unintentional   Lisa Roca, MD 07/01/15 1625

## 2015-07-01 NOTE — Progress Notes (Signed)
  Oncology Nurse Navigator Documentation  Navigator Location: [facility] (07/01/15 1300) Navigator Encounter Type: Follow-up Appt (07/01/15 1300)                                          Time Spent with Patient: 15 (07/01/15 1300)   Escorted to the Ed by nursing staff

## 2015-07-01 NOTE — Progress Notes (Signed)
Patient states she is having SOB. Pulse ox 93%. States she has been having SOB for several days since she came home from the hospital. Denies pain, nausea or vomiting.

## 2015-07-01 NOTE — H&P (Signed)
[facility] - [place] at [facility]   PATIENT NAME: [name]    MR#:  [id number]  DATE OF BIRTH:  04/16/1946  DATE OF ADMISSION:  07/01/2015  PRIMARY CARE PHYSICIAN: WALKER,JENNIFER AZBELL, MD   REQUESTING/REFERRING PHYSICIAN: Dr. Malinda  CHIEF COMPLAINT:  Increasing shortness of breath leg swelling  HISTORY OF PRESENT ILLNESS:  [name]  is a 69 y.o. female with a known history of advanced high-grade serous ovarian cancer status post 4 cycles of chemotherapy and interval debulking status post diagnostic laparoscopy exploratory laparotomy lysis of adhesion total abdominal hysterectomy with bilateral salpingo-oophorectomy optimal tumor debulking rectosigmoid resection with creation of end colostomy on 06/15/2015 at [facility]. Her postoperative course was contacted and with malignant pleural effusion she underwent thoracentesis with removal of significant amount of fluid per husband was discharged to home when to cancer Center on her routine visit today complaining of increasing shortness of breath and leg swelling.   PAST MEDICAL HISTORY:   Past Medical History  Diagnosis Date  . Leukopenia 2012    s/p bone marrow biopsy, Dr. Pandit  . Cholelithiasis   . GERD (gastroesophageal reflux disease)   . Hypertension   . Hyperlipidemia   . Esophageal stricture   . Chronic diastolic CHF (congestive heart failure) (HCC)   . CAD (coronary artery disease)     a. 09/2012 Cath: LM nl, LAD 95p, 70m, LCX 90m, OM2 50, RCA 100.  . Hypokalemia   . Herniated disc   . Pneumonia 2013; 08/2012    "one lung; double" (10/18/2012)  . Exertional shortness of breath   . Type I diabetes mellitus (HCC)     "dx'd in 1957" (10/18/2012)  . History of pancytopenia   . H/O hiatal hernia   . Rheumatoid arthritis(714.0)   . NSTEMI (non-ST elevated myocardial infarction) (HCC) 08/2012    "mild" (10/18/2012)  . Collagen vascular disease (HCC)   . Ovarian  cancer (HCC)     PAST SURGICAL HISTOIRY:   Past Surgical History  Procedure Laterality Date  . Esophagogastroduodenoscopy  2012    Dr. Iftikar  . Tubal ligation  1970  . Cataract extraction w/ intraocular lens implant Right 2010  . Cardiac catheterization  10/18/2012    "first one was today" (10/18/2012)  . Esophageal dilation      "3 or 4 times" (10/19/2011)  . Coronary artery bypass graft N/A 10/19/2012    Procedure: CORONARY ARTERY BYPASS GRAFTING (CABG);  Surgeon: Steven C Hendrickson, MD;  Location: [facility] OR;  Service: Open Heart Surgery;  Laterality: N/A;  . Peripheral vascular catheterization N/A 03/02/2015    Procedure: Porta Cath Insertion;  Surgeon: Jason S Dew, MD;  Location: [facility] INVASIVE CV LAB;  Service: Cardiovascular;  Laterality: N/A;  . Abdominal hysterectomy  06/15/2015    Dx L/S, EXLAP TAH BSO omentectomy RSRx colostomy diaphragm resection stripping  . Cholecystectomy  06/15/2015    combined case with ovarian cancer debulking    SOCIAL HISTORY:   Social History  Substance Use Topics  . Smoking status: Former Smoker -- 1.00 packs/day for 30 years    Types: Cigarettes    Quit date: 06/11/1994  . Smokeless tobacco: Never Used  . Alcohol Use: No    FAMILY HISTORY:   Family History  Problem Relation Age of Onset  . Diabetes Mother   . Arthritis Mother   . Diabetes Father   . Arthritis Father   . Bone cancer Sister     DRUG ALLERGIES:     Allergies  Allergen Reactions  . Codeine Nausea And Vomiting    REVIEW OF SYSTEMS:  Review of Systems  Constitutional: Negative for fever, chills and weight loss.  HENT: Negative for ear discharge, ear pain and nosebleeds.   Eyes: Negative for blurred vision, pain and discharge.  Respiratory: Positive for cough and shortness of breath. Negative for sputum production, wheezing and stridor.   Cardiovascular: Negative for chest pain, palpitations, orthopnea and PND.  Gastrointestinal: Negative for nausea, vomiting,  abdominal pain and diarrhea.  Genitourinary: Negative for urgency and frequency.  Musculoskeletal: Negative for back pain and joint pain.  Neurological: Positive for weakness. Negative for sensory change, speech change and focal weakness.  Psychiatric/Behavioral: Negative for depression and hallucinations. The patient is not nervous/anxious.   All other systems reviewed and are negative.    MEDICATIONS AT HOME:   Prior to Admission medications   Medication Sig Start Date End Date Taking? Authorizing Provider  amLODipine (NORVASC) 5 MG tablet Take 5 mg by mouth daily.   Yes Historical Provider, MD  aspirin EC 81 MG tablet Take 81 mg by mouth daily.   Yes Historical Provider, MD  cholecalciferol (VITAMIN D) 1000 units tablet Take 2,000 Units by mouth daily.   Yes Historical Provider, MD  Multiple Vitamin (MULTIVITAMIN WITH MINERALS) TABS tablet Take 1 tablet by mouth daily.   Yes Historical Provider, MD  enoxaparin (LOVENOX) 40 MG/0.4ML injection Inject 1 mg into the skin daily. 06/19/15 07/13/15  Historical Provider, MD  furosemide (LASIX) 40 MG tablet Take 1 tablet (40 mg total) by mouth 2 (two) times daily. 05/15/14   Jackolyn Confer, MD  gabapentin (NEURONTIN) 300 MG capsule Take 1 capsule by mouth daily.    Historical Provider, MD  ibuprofen (ADVIL,MOTRIN) 800 MG tablet Take 1 tablet (800 mg total) by mouth 3 (three) times daily as needed. 05/15/14   Jackolyn Confer, MD  insulin lispro (HUMALOG) 100 UNIT/ML injection As directed via Insulin pump 11/18/14   Jackolyn Confer, MD  lidocaine-prilocaine (EMLA) cream Apply 1 application topically as needed. Apply application 1 hour prior to treatment.  Cover with Press n seal until treatment time. 03/04/15   Lequita Asal, MD  loratadine (CLARITIN) 10 MG tablet Take 10 mg by mouth daily.    Historical Provider, MD  LORazepam (ATIVAN) 0.5 MG tablet Take 1 tablet (0.5 mg total) by mouth every 6 (six) hours as needed (nausea). 03/06/15   Lequita Asal, MD  losartan (COZAAR) 100 MG tablet Take 1 tablet (100 mg total) by mouth daily. 05/15/14   Jackolyn Confer, MD  magnesium oxide (MAG-OX) 400 MG tablet Take 400 mg by mouth daily.     Historical Provider, MD  metoprolol tartrate (LOPRESSOR) 25 MG tablet Take 1 tablet (25 mg total) by mouth 2 (two) times daily as needed. 05/15/14   Jackolyn Confer, MD  ondansetron (ZOFRAN) 8 MG tablet Take 1 tablet (8 mg total) by mouth every 8 (eight) hours as needed for nausea or vomiting. 03/06/15   Lequita Asal, MD  pantoprazole (PROTONIX) 40 MG tablet Take 1 tablet (40 mg total) by mouth 2 (two) times daily. Patient taking differently: Take 40 mg by mouth daily.  05/15/14   Jackolyn Confer, MD  potassium chloride SA (K-DUR,KLOR-CON) 10 MEQ tablet Take 1 tablet (10 mEq total) by mouth 2 (two) times daily. 04/13/15   Lequita Asal, MD  predniSONE (DELTASONE) 5 MG tablet Take 1 tablet by mouth daily as  needed.    Historical Provider, MD  pregabalin (LYRICA) 75 MG capsule Take 75 mg by mouth 2 (two) times daily.     Historical Provider, MD  ranitidine (ZANTAC) 150 MG tablet Take 1 tablet (150 mg total) by mouth 2 (two) times daily. 11/13/14   Jackolyn Confer, MD  saccharomyces boulardii (FLORASTOR) 250 MG capsule Take 250 mg by mouth as needed. Reported on 05/06/2015 02/19/13   Jerene Bears, MD  vitamin C (ASCORBIC ACID) 500 MG tablet Take 500 mg by mouth daily.     Historical Provider, MD  zinc gluconate 50 MG tablet Take 50 mg by mouth daily.     Historical Provider, MD  zoledronic acid (RECLAST) 5 MG/100ML SOLN Inject 5 mg into the vein once.    Historical Provider, MD      VITAL SIGNS:  Blood pressure 156/61, pulse 92, temperature 98.3 F (36.8 C), temperature source Oral, resp. rate 29, height 5' 3" (1.6 m), weight 69 kg (152 lb 1.9 oz), SpO2 96 %.  PHYSICAL EXAMINATION:  GENERAL:  70 y.o.-year-old patient lying in the bed with no acute distress.  EYES: Pupils equal, round,  reactive to light and accommodation. No scleral icterus. Extraocular muscles intact.  HEENT: Head atraumatic, normocephalic. Oropharynx and nasopharynx clear.  NECK:  Supple, no jugular venous distention. No thyroid enlargement, no tenderness.  LUNGS: Decreased breath sounds bilaterally, no wheezing, rales,rhonchi or crepitation. No use of accessory muscles of respiration. No respiratory distress port right upper chest CARDIOVASCULAR: S1, S2 normal. No murmurs, rubs, or gallops.  ABDOMEN: Soft, nontender, nondistended. Bowel sounds present. No organomegaly or mass.  EXTREMITIES:pe 3+ dal edema,  nocyanosis, or  no clubbing.  NEUROLOGIC: Cranial nerves II through XII are intact. Muscle strength 5/5 in all extremities. Sensation intact. Gait not checked.  generalized weakness PSYCHIATRIC: The patient is alert and oriented x 3.  SKIN: No obvious rash, lesion, or ulcer.   LABORATORY PANEL:   CBC  Recent Labs Lab 07/01/15 1445  WBC 5.4  HGB 9.9*  HCT 29.3*  PLT 393   ------------------------------------------------------------------------------------------------------------------  Chemistries   Recent Labs Lab 07/01/15 1445  NA 134*  K 3.6  CL 98*  CO2 27  GLUCOSE 267*  BUN 16  CREATININE 0.69  CALCIUM 8.8*   ------------------------------------------------------------------------------------------------------------------  Cardiac Enzymes  Recent Labs Lab 07/01/15 1445  TROPONINI 0.04*   ------------------------------------------------------------------------------------------------------------------  RADIOLOGY:  Dg Chest 2 View  07/01/2015  CLINICAL DATA:  Shortness of Breath EXAM: CHEST  2 VIEW COMPARISON:  November 20, 2012 FINDINGS: There is a large right pleural effusion with consolidation throughout the right middle and lower lobes. The left lung is clear. Heart size and pulmonary vascularity are normal. No adenopathy. Port-A-Cath tip is in the superior vena cava.  Patient is status post coronary artery bypass grafting. No adenopathy evident. There is stable anterior wedging of the T12 vertebral body. IMPRESSION: Large right pleural effusion with consolidation in the right middle and lower lobes, probably enlarged part due to compressive atelectasis. Electronically Signed   By: Lowella Grip III M.D.   On: 07/01/2015 15:47    EKG:   Sinus tachycardia C  IMPRESSION AND PLAN:  [name]  is a 70 y.o. female with a known history of advanced high-grade serous ovarian cancer status post 4 cycles of chemotherapy and interval debulking status post diagnostic laparoscopy exploratory laparotomy lysis of adhesion total abdominal hysterectomy with bilateral salpingo-oophorectomy optimal tumor debulking rectosigmoid resection with creation of end colostomy on 06/15/2015  at [facility]  #1 increasing shortness of breath and leg swelling and chest x-ray suggestive of malignant pleural effusion recurrent -Admit to medical floor -cardiothoracic consultation with Dr. Genevive Bi. Case was discussed with him.  -Hold off Lovenox tomorrow for anticipation chest tube placement by Dr. Genevive Bi on Friday  #2 advanced high-grade serous Ovarian cancer status post chemotherapy and debulking surgery Per gynecology oncology  #3 diabetes type 2 insulin requiring. Patient is on insulin pump. Her primary endocrinologist is Dr. Gabriel Carina -Patient will be continued on her insulin pump. She'll manage her insulin pump. We'll check sugars before meals and at bedtime  4. Hypertension continue losartan and amlodipine  5. Lower extremity edema continue Lasix  6. DVT prophylaxis on Lovenox  All the records are reviewed and case discussed with ED provider. Management plans discussed with the patient, family and they are in agreement.  CODE STATUS: full  TOTAL TIME TAKING CARE OF THIS PATIENT: 50 minutes.    [name] M.D on 07/01/2015 at 5:03 PM  Between 7am to 6pm -  Pager - [phone]  After 6pm go to [email or web address] - password [email or web address] [facility]  Office  [phone]  CC: Primary care physician; Rica Mast, MD

## 2015-07-01 NOTE — ED Notes (Signed)
Lab notified of troponin, bnp add on.

## 2015-07-01 NOTE — ED Notes (Signed)
Pt in via triage with complaints of increasing shortness of breath x 1 week.  Pt reports extensive surgery recently to remove ovarian cancer; prior to discharge, pt with thoracentesis to resolve post op pleural effusion on right side.  Pt A/Ox4, pale in color, edematous to BLE.  Pt reports feelings of "pressure/fullness" under right breast.  Pt placed on 2L nasal cannula to maintain sats >90%, other vitals WDL.  Husband at bedside.  MD at bedside.

## 2015-07-01 NOTE — ED Notes (Signed)
Pt had extensive surgery to remove ovarian cancer, including complete hysterectomy, removal of part of intestines (now with colostomy).  Post-operatively, pt had pleural effusion of R lung, for which she received a thoracentesis to correct 2 weeks ago.  Pt now with shortness of breath that has lasted approx 1 week and her surgeon requested she be evaluated in ED.

## 2015-07-02 ENCOUNTER — Other Ambulatory Visit: Payer: Self-pay | Admitting: Hematology and Oncology

## 2015-07-02 ENCOUNTER — Inpatient Hospital Stay: Payer: Medicare Other

## 2015-07-02 DIAGNOSIS — I998 Other disorder of circulatory system: Secondary | ICD-10-CM

## 2015-07-02 DIAGNOSIS — R600 Localized edema: Secondary | ICD-10-CM

## 2015-07-02 DIAGNOSIS — J90 Pleural effusion, not elsewhere classified: Principal | ICD-10-CM

## 2015-07-02 DIAGNOSIS — E109 Type 1 diabetes mellitus without complications: Secondary | ICD-10-CM

## 2015-07-02 DIAGNOSIS — Z9221 Personal history of antineoplastic chemotherapy: Secondary | ICD-10-CM

## 2015-07-02 DIAGNOSIS — I5032 Chronic diastolic (congestive) heart failure: Secondary | ICD-10-CM

## 2015-07-02 DIAGNOSIS — I10 Essential (primary) hypertension: Secondary | ICD-10-CM

## 2015-07-02 DIAGNOSIS — C562 Malignant neoplasm of left ovary: Secondary | ICD-10-CM

## 2015-07-02 DIAGNOSIS — K769 Liver disease, unspecified: Secondary | ICD-10-CM

## 2015-07-02 DIAGNOSIS — D72819 Decreased white blood cell count, unspecified: Secondary | ICD-10-CM

## 2015-07-02 DIAGNOSIS — E785 Hyperlipidemia, unspecified: Secondary | ICD-10-CM

## 2015-07-02 DIAGNOSIS — Z87891 Personal history of nicotine dependence: Secondary | ICD-10-CM

## 2015-07-02 DIAGNOSIS — I252 Old myocardial infarction: Secondary | ICD-10-CM

## 2015-07-02 DIAGNOSIS — C561 Malignant neoplasm of right ovary: Secondary | ICD-10-CM

## 2015-07-02 DIAGNOSIS — Z79899 Other long term (current) drug therapy: Secondary | ICD-10-CM

## 2015-07-02 DIAGNOSIS — K219 Gastro-esophageal reflux disease without esophagitis: Secondary | ICD-10-CM

## 2015-07-02 DIAGNOSIS — I2510 Atherosclerotic heart disease of native coronary artery without angina pectoris: Secondary | ICD-10-CM

## 2015-07-02 DIAGNOSIS — M069 Rheumatoid arthritis, unspecified: Secondary | ICD-10-CM

## 2015-07-02 DIAGNOSIS — Z8701 Personal history of pneumonia (recurrent): Secondary | ICD-10-CM

## 2015-07-02 DIAGNOSIS — E876 Hypokalemia: Secondary | ICD-10-CM

## 2015-07-02 DIAGNOSIS — K319 Disease of stomach and duodenum, unspecified: Secondary | ICD-10-CM

## 2015-07-02 LAB — GLUCOSE, CAPILLARY
GLUCOSE-CAPILLARY: 226 mg/dL — AB (ref 65–99)
GLUCOSE-CAPILLARY: 161 mg/dL — AB (ref 65–99)
GLUCOSE-CAPILLARY: 296 mg/dL — AB (ref 65–99)
Glucose-Capillary: 168 mg/dL — ABNORMAL HIGH (ref 65–99)

## 2015-07-02 LAB — GLUCOSE, SEROUS FLUID: Glucose, Fluid: 169 mg/dL

## 2015-07-02 LAB — BODY FLUID CELL COUNT WITH DIFFERENTIAL
Eos, Fluid: 0 %
Lymphs, Fluid: 11 %
Monocyte-Macrophage-Serous Fluid: 6 %
Neutrophil Count, Fluid: 83 %
Other Cells, Fluid: 0 %
Total Nucleated Cell Count, Fluid: 1298 cu mm

## 2015-07-02 LAB — PROTIME-INR
INR: 1.05
Prothrombin Time: 13.9 seconds (ref 11.4–15.0)

## 2015-07-02 LAB — PROTEIN, BODY FLUID: Total protein, fluid: 3.4 g/dL

## 2015-07-02 LAB — LACTATE DEHYDROGENASE, PLEURAL OR PERITONEAL FLUID: LD, Fluid: 207 U/L — ABNORMAL HIGH (ref 3–23)

## 2015-07-02 LAB — APTT: APTT: 24 s (ref 24–36)

## 2015-07-02 MED ORDER — INSULIN PUMP
SUBCUTANEOUS | Status: DC
  Administered 2015-07-02: 40 via SUBCUTANEOUS
  Filled 2015-07-02: qty 1

## 2015-07-02 MED ORDER — ATORVASTATIN CALCIUM 20 MG PO TABS: 80.0000 mg | ORAL_TABLET | Freq: Every day | ORAL | Status: DC

## 2015-07-02 MED ORDER — ZINC SULFATE 220 (50 ZN) MG PO CAPS
220.0000 mg | ORAL_CAPSULE | Freq: Every day | ORAL | Status: DC
  Filled 2015-07-02 (×2): qty 1

## 2015-07-02 MED ORDER — ENOXAPARIN SODIUM 40 MG/0.4ML ~~LOC~~ SOLN: 40.0000 mg | SUBCUTANEOUS | Status: DC

## 2015-07-02 MED ORDER — ENOXAPARIN SODIUM 40 MG/0.4ML ~~LOC~~ SOLN
40.0000 mg | Freq: Every day | SUBCUTANEOUS | Status: DC
  Administered 2015-07-02: 40 mg via SUBCUTANEOUS
  Filled 2015-07-02: qty 0.4

## 2015-07-02 MED ORDER — INSULIN ASPART 100 UNIT/ML ~~LOC~~ SOLN
0.0000 [IU] | Freq: Every day | SUBCUTANEOUS | Status: DC
  Administered 2015-07-02: 3 [IU] via SUBCUTANEOUS
  Filled 2015-07-02: qty 3

## 2015-07-02 MED ORDER — INSULIN ASPART 100 UNIT/ML ~~LOC~~ SOLN
0.0000 [IU] | Freq: Three times a day (TID) | SUBCUTANEOUS | Status: DC
  Filled 2015-07-02: qty 3

## 2015-07-02 NOTE — Progress Notes (Signed)
Patient ID: [name], female   DOB: 03-23-1946, 70 y.o.   MRN: [id number]  Chief Complaint  Patient presents with  . Shortness of Breath    Referred By Dr. Wray Kearns Reason for Referral right pleural effusion  HPI Location, Quality, Duration, Severity, Timing, Context, Modifying Factors, Associated Signs and Symptoms.  [name] is a 70 y.o. female.  I have personally seen and examined [name]. I discussed her care with Dr. Mike Gip in oncology as well as with her primary GYN oncologist. This is a 70 year old woman who is approximately one month out from a total abdominal hysterectomy with partial colectomy for an ovarian carcinoma. This was performed at [facility] by Dr. Theora Gianotti. During that procedure a small portion of the right diaphragm was resected and closed primarily. The patient subsequently underwent a right-sided thoracentesis at [facility] about 2 weeks ago. We do not have the final report on that but apparently it did not show any evidence of malignancy. Patient was doing reasonably well when she was seen in follow-up yesterday. The patient complained of increasing shortness of breath and a chest x-ray confirmed the presence of a large right-sided pleural effusion. She is admitted to the hospital for further management. She states that when she had her thoracentesis at [facility] she did feel better. Since she's been in the hospital and resting comfortably in bed she does not complain of any significant shortness of breath at this time.   Past Medical History  Diagnosis Date  . Leukopenia 2012    s/p bone marrow biopsy, Dr. Ma Hillock  . Cholelithiasis   . GERD (gastroesophageal reflux disease)   . Hypertension   . Hyperlipidemia   . Esophageal stricture   . Chronic diastolic CHF (congestive heart failure) ([place])   . CAD (coronary artery disease)     a. 09/2012 Cath: LM nl, LAD 95p, 33m LCX 939mOM2 50, RCA 100.  . Marland Kitchenypokalemia   . Herniated disc   . Pneumonia  2013; 08/2012    "one lung; double" (10/18/2012)  . Exertional shortness of breath   . [place]    "dx'd in 1957" (10/18/2012)  . History of pancytopenia   . H/O hiatal hernia   . Rheumatoid arthritis(714.0)   . NSTEMI (non-ST elevated myocardial infarction) (HCHarris5/2014    "mild" (10/18/2012)  . Collagen vascular disease ([facility]  . [facility]    Past Surgical History  Procedure Laterality Date  . Esophagogastroduodenoscopy  2012    Dr. IfMinna Merritts. Tubal ligation  1970  . Cataract extraction w/ intraocular lens implant Right 2010  . Cardiac catheterization  10/18/2012    "first one was today" (10/18/2012)  . Esophageal dilation      "3 or 4 times" (10/19/2011)  . Coronary artery bypass graft N/A 10/19/2012    Procedure: CORONARY ARTERY BYPASS GRAFTING (CABG);  Surgeon: StMelrose NakayamaMD;  [place] [facility] [facility]: [facility];  Laterality: N/A;  . Peripheral vascular catheterization N/A 03/02/2015    Procedure: Porta Cath Insertion;  Surgeon: JaAlgernon HuxleyMD;  [facility];  Service: Cardiovascular;  Laterality: N/A;  . Abdominal hysterectomy  06/15/2015    Dx L/S, EXLAP TAH BSO omentectomy RSRx colostomy diaphragm resection stripping  . Cholecystectomy  06/15/2015    combined case with ovarian cancer debulking    Family History  Problem Relation Age of Onset  . Diabetes Mother   .  Arthritis Mother   . Diabetes Father   . Arthritis Father   . Bone cancer Sister     Social History Social History  Substance Use Topics  . Smoking status: Former Smoker -- 1.00 packs/day for 30 years    Types: Cigarettes    Quit date: 06/11/1994  . Smokeless tobacco: Never Used  . Alcohol Use: No    Allergies  Allergen Reactions  . Codeine Nausea And Vomiting    Current Facility-Administered Medications  Medication Dose Route Frequency Provider Last Rate Last Dose  . 0.9 %  sodium chloride infusion  250 mL Intravenous PRN  Fritzi Mandes, MD      . acetaminophen (TYLENOL) tablet 650 mg  650 mg Oral Q6H PRN Fritzi Mandes, MD       Or  . acetaminophen (TYLENOL) suppository 650 mg  650 mg Rectal Q6H PRN Fritzi Mandes, MD      . amLODipine (NORVASC) tablet 5 mg  5 mg Oral Daily Fritzi Mandes, MD   5 mg at 07/02/15 1050  . [START ON 07/03/2015] atorvastatin (LIPITOR) tablet 80 mg  80 mg Oral q1800 Henreitta Leber, MD      . bisacodyl (DULCOLAX) EC tablet 5 mg  5 mg Oral Daily PRN Fritzi Mandes, MD      . cholecalciferol (VITAMIN D) tablet 2,000 Units  2,000 Units Oral Daily Fritzi Mandes, MD   2,000 Units at 07/02/15 1046  . famotidine (PEPCID) tablet 10 mg  10 mg Oral Daily Fritzi Mandes, MD   10 mg at 07/02/15 1049  . furosemide (LASIX) tablet 40 mg  40 mg Oral BID Fritzi Mandes, MD   40 mg at 07/02/15 1046  . gabapentin (NEURONTIN) capsule 300 mg  300 mg Oral Daily Fritzi Mandes, MD   300 mg at 07/02/15 1051  . insulin aspart (novoLOG) injection 0-15 Units  0-15 Units Subcutaneous TID WC Henreitta Leber, MD      . insulin aspart (novoLOG) injection 0-5 Units  0-5 Units Subcutaneous QHS Henreitta Leber, MD      . insulin pump   Subcutaneous 6 times per day Henreitta Leber, MD   40 each at 07/02/15 1200  . loratadine (CLARITIN) tablet 10 mg  10 mg Oral Daily Fritzi Mandes, MD   10 mg at 07/01/15 2100  . LORazepam (ATIVAN) tablet 0.5 mg  0.5 mg Oral Q6H PRN Fritzi Mandes, MD      . losartan (COZAAR) tablet 100 mg  100 mg Oral Daily Fritzi Mandes, MD   100 mg at 07/02/15 1046  . magnesium oxide (MAG-OX) tablet 400 mg  400 mg Oral Daily Fritzi Mandes, MD   400 mg at 07/01/15 2100  . metoprolol tartrate (LOPRESSOR) tablet 25 mg  25 mg Oral BID Fritzi Mandes, MD   25 mg at 07/02/15 1050  . multivitamin with minerals tablet 1 tablet  1 tablet Oral Daily Fritzi Mandes, MD   1 tablet at 07/02/15 1046  . ondansetron (ZOFRAN) tablet 4 mg  4 mg Oral Q6H PRN Fritzi Mandes, MD       Or  . ondansetron (ZOFRAN) injection 4 mg  4 mg Intravenous Q6H PRN Fritzi Mandes, MD      .  ondansetron (ZOFRAN) tablet 8 mg  8 mg Oral Q8H PRN Fritzi Mandes, MD      . pantoprazole (PROTONIX) EC tablet 40 mg  40 mg Oral Daily Fritzi Mandes, MD   40 mg at 07/01/15 2100  . potassium  chloride (K-DUR,KLOR-CON) CR tablet 10 mEq  10 mEq Oral BID Fritzi Mandes, MD   10 mEq at 07/02/15 1046  . pregabalin (LYRICA) capsule 75 mg  75 mg Oral BID Fritzi Mandes, MD   75 mg at 07/02/15 1046  . saccharomyces boulardii (FLORASTOR) capsule 250 mg  250 mg Oral PRN Fritzi Mandes, MD      . senna-docusate (Senokot-S) tablet 1 tablet  1 tablet Oral QHS PRN Fritzi Mandes, MD      . sodium chloride flush (NS) 0.9 % injection 3 mL  3 mL Intravenous Q12H Fritzi Mandes, MD   3 mL at 07/02/15 1054  . sodium chloride flush (NS) 0.9 % injection 3 mL  3 mL Intravenous PRN Fritzi Mandes, MD      . vitamin C (ASCORBIC ACID) tablet 500 mg  500 mg Oral Daily Fritzi Mandes, MD   500 mg at 07/02/15 1052  . zinc sulfate capsule 220 mg  220 mg Oral Daily Henreitta Leber, MD   220 mg at 07/02/15 1052   Facility-Administered Medications Ordered in Other Encounters  Medication Dose Route Frequency Provider Last Rate Last Dose  . sodium chloride 0.9 % injection 10 mL  10 mL Intracatheter PRN Lequita Asal, MD      . sodium chloride 0.9 % injection 10 mL  10 mL Intravenous PRN Lequita Asal, MD   10 mL at 04/13/15 4967      Review of Systems A complete review of systems was asked and was negative except for the following positive findings well healed stoma, no shortness of breath, no fever.  Blood pressure 152/55, pulse 90, temperature 97.9 F (36.6 C), temperature source Oral, resp. rate 18, height 5' 3"  (1.6 m), weight 152 lb 1.9 oz (69 kg), SpO2 92 %.  Physical Exam CONSTITUTIONAL:  Pleasant, well-developed, well-nourished, and in no acute distress. RESPIRATORY:  Lungs were clear on the left and diminished on the right.  Normal respiratory effort without pathologic use of accessory muscles of respiration CARDIOVASCULAR: Heart was  irregular without murmurs.  There were no carotid bruits. GI: The abdomen was soft, nontender, and nondistended. There were no palpable masses. There was no hepatosplenomegaly. There were normal bowel sounds in all quadrants. GU:  Rectal deferred.  Functioning stoma.  Healing abdominal wound MUSCULOSKELETAL:  Normal muscle strength and tone.  No clubbing or cyanosis.   SKIN:  There were no pathologic skin lesions.  There were no nodules on palpation. NEUROLOGIC:  Sensation is normal.  Cranial nerves are grossly intact. PSYCH:  Oriented to person, place and time.  Mood and affect are normal.  Data Reviewed I have independently reviewed the patient's chest x-ray showing a large right-sided pleural effusion  I have personally reviewed the patient's imaging, laboratory findings and medical records.    Assessment    Large right-sided pleural effusion. This may be malignant in nature although there was no evidence of any intrapleural disease less than a month ago at the time of Dr. Gershon Crane procedure.    Plan    I had a long talk with the patient and her husband. I discussed her care with her primary oncologist and GYN oncologist. The thought is at this point in time to proceed with a thoracentesis alone. There did not appear to be any need for a thoracoscopic approach or a Pleurx catheter. She will undergo a thoracentesis and then have the fluid analyzed for cytology.       Nestor Lewandowsky, MD 07/02/2015,  5:32 PM

## 2015-07-02 NOTE — Progress Notes (Signed)
[facility]  Date of admission:  07/01/2015  Inpatient day:  07/02/2015  Consulting physician: Dr. Abel Presto   Reason for Consultation:  Ovarian cancer.  Here with recurrent pleural effusion.  Chief Complaint: [name] is a 70 y.o. female with stage IIIC ovarian cancer s/p 4 cycles of neoadjuvant chemotherapy and debulking surgery who was admitted with increasing shortness of breath and bilateral lower extremity swelling.  HPI:  The patient presented with abdominal discomfort and bloating. Omental biopsy on 02/23/2015 revealed metastatic high grade serous carcinoma, consistent with gynecologic origin. CA125 was 707 on 02/17/2015.  Abdomen and pelvic CT scan on 02/13/2015 revealed bilateral mass-like adnexal regions (right adnexal mass 5.6 x 5.0 cm and the left adnexa mass 10.3 x 6.0 x 9.9 cm). There was a large amount of soft tissue throughout the peritoneal cavity involving the omentum and other peritoneal surfaces. There was a small volume ascites. There was a peripheral 3.3 x 1.9 cm low-attenuation lesion overlying the right lobe of the liver , likely representing a serosal implant. There was 1.4 x 2.2 cm ill-defined peripheral lesion within the inferior aspect of segment 6 adjacent to the ampulla in the duodenum.   She received 4 cycles of neoadjuvant carboplatin and Taxol (03/05/2015 - 05/22/2015). CA125 was 802.9 on 03/30/2015 and 168.8 on 05/15/2015.  Abdominal and pelvic CT scan on 04/28/2015 revealed decreasing bilateral ovarian masses. The left adnexal mass measured 4.0 x 6.1 cm (previously 5.0 x 8.5 cm). The right ovary measured 3.8 x 4.9 cm (previously 4.7 x 5.6 cm). There was improved peritoneal carcinomatosis. There was a small amount of ascites. The hepatic dome lesion was stable. The previously seen right hepatic lobe lesion was not well-visualized. There was a small left pleural effusion and trace right pleural effusion. The nodular  lesion at the ampulla of Vater, extending into the duodenum was stable. There was no biliary ductal dilatation.  The patient underwent exploratory laparotomy, lysis of adhesions, total abdominal hysterectomy with bilateral salpingo-oophorectomy, infracolic omentectomy, optimal tumor debulking(< 1 cm), recto-sigmoid resection with creation of end colostomy, cholecystectomy, mobilization of splenic flexure and liver with diaphragmatic stripping on 06/15/2015.  The right diaphragm was cleared of tumor. During dissection, the diaphragm was entered and closed with sutures.  She tolerated her surgery well.  On approximately post-operative day 3, she had 650 cc of right sided pleural fluid removed.  Cytology is unavailable.  She was discharged home on 06/22/2015.  She notes a week history of progressive shortness of breath.  CXR on 07/01/2015 revealed a large right pleural effusion with consolidation in the right middle and lower lobes probably due to compressive atelectasis.  She denies any fever or chills.   Past Medical History  Diagnosis Date  . Leukopenia 2012    s/p bone marrow biopsy, Dr. Ma Hillock  . Cholelithiasis   . GERD (gastroesophageal reflux disease)   . Hypertension   . Hyperlipidemia   . Esophageal stricture   . Chronic diastolic CHF (congestive heart failure) ([place])   . CAD (coronary artery disease)     a. 09/2012 Cath: LM nl, LAD 95p, 18m LCX 943mOM2 50, RCA 100.  . Marland Kitchenypokalemia   . Herniated disc   . Pneumonia 2013; 08/2012    "one lung; double" (10/18/2012)  . Exertional shortness of breath   . Type I diabetes mellitus ([place]    "dx'd in 1957" (10/18/2012)  . History of pancytopenia   . H/O hiatal hernia   . Rheumatoid  arthritis(714.0)   . NSTEMI (non-ST elevated myocardial infarction) ([place]) 08/2012    "mild" (10/18/2012)  . Collagen vascular disease ([place])   . Ovarian cancer [facility])     Past Surgical History  Procedure Laterality Date  . Esophagogastroduodenoscopy  2012     Dr. Minna Merritts  . Tubal ligation  1970  . Cataract extraction w/ intraocular lens implant Right 2010  . Cardiac catheterization  10/18/2012    "first one was today" (10/18/2012)  . Esophageal dilation      "3 or 4 times" (10/19/2011)  . Coronary artery bypass graft N/A 10/19/2012    Procedure: CORONARY ARTERY BYPASS GRAFTING (CABG);  Surgeon: Melrose Nakayama, MD;  Location: [facility];  Service: Open Heart Surgery;  Laterality: N/A;  . Peripheral vascular catheterization N/A 03/02/2015    Procedure: Porta Cath Insertion;  Surgeon: Algernon Huxley, MD;  Location: [place];  Service: Cardiovascular;  Laterality: N/A;  . Abdominal hysterectomy  06/15/2015    Dx L/S, EXLAP TAH BSO omentectomy RSRx colostomy diaphragm resection stripping  . Cholecystectomy  06/15/2015    combined case with ovarian cancer debulking    Family History  Problem Relation Age of Onset  . Diabetes Mother   . Arthritis Mother   . Diabetes Father   . Arthritis Father   . Bone cancer Sister     Social History:  reports that she quit smoking about 21 years ago. Her smoking use included Cigarettes. She has a 30 pack-year smoking history. She has never used smokeless tobacco. She reports that she does not drink alcohol or use illicit drugs.  The patient is accompanied by her husband.  Allergies:  Allergies  Allergen Reactions  . Codeine Nausea And Vomiting    Medications Prior to Admission  Medication Sig Dispense Refill  . amLODipine (NORVASC) 5 MG tablet Take 5 mg by mouth daily.    Marland Kitchen atorvastatin (LIPITOR) 80 MG tablet Take 80 mg by mouth every evening.    . Calcium Carbonate-Vitamin D (CALCIUM 600+D) 600-400 MG-UNIT tablet Take 1 tablet by mouth 2 (two) times daily.    . cholecalciferol (VITAMIN D) 1000 units tablet Take 2,000 Units by mouth daily.    Marland Kitchen enoxaparin (LOVENOX) 40 MG/0.4ML injection Inject 40 mg into the skin daily.     . furosemide (LASIX) 40 MG tablet Take 1 tablet (40 mg total) by mouth 2  (two) times daily. 180 tablet 3  . gabapentin (NEURONTIN) 300 MG capsule Take 300 mg by mouth 2 (two) times daily.     Marland Kitchen ibuprofen (ADVIL,MOTRIN) 800 MG tablet Take 800 mg by mouth 3 (three) times daily as needed for mild pain.    . Insulin Human (INSULIN PUMP) SOLN Pt uses Humalog insulin.    Marland Kitchen lidocaine-prilocaine (EMLA) cream Apply 1 application topically as needed (prior to accessing port).    . loratadine (CLARITIN) 10 MG tablet Take 10 mg by mouth daily.    Marland Kitchen losartan (COZAAR) 100 MG tablet Take 1 tablet (100 mg total) by mouth daily. 90 tablet 3  . metoprolol tartrate (LOPRESSOR) 25 MG tablet Take 25 mg by mouth 2 (two) times daily.    . Multiple Vitamin (MULTIVITAMIN WITH MINERALS) TABS tablet Take 1 tablet by mouth daily.    . ondansetron (ZOFRAN) 8 MG tablet Take 1 tablet (8 mg total) by mouth every 8 (eight) hours as needed for nausea or vomiting. 20 tablet 1  . pantoprazole (PROTONIX) 40 MG tablet Take 40 mg by mouth  every evening.    . potassium chloride SA (K-DUR,KLOR-CON) 20 MEQ tablet Take 20 mEq by mouth 2 (two) times daily.    . ranitidine (ZANTAC) 150 MG tablet Take 1 tablet (150 mg total) by mouth 2 (two) times daily. 180 tablet 2  . zoledronic acid (RECLAST) 5 MG/100ML SOLN Inject 5 mg into the vein See admin instructions. Pt gets an injection once per year.      Review of Systems: GENERAL:  Fatigue.  No fevers, sweats or weight loss. PERFORMANCE STATUS (ECOG):  2 HEENT:  No visual changes, runny nose, sore throat, mouth sores or tenderness. Lungs:  Progressive shortness of breath x 1 week.  No cough.  No hemoptysis. Cardiac:  No chest pain, palpitations, orthopnea, or PND. GI:  No nausea, vomiting, diarrhea, constipation, melena or hematochezia. GU:  No urgency, frequency, dysuria, or hematuria. Musculoskeletal:  Severe rheumatoid arthritis.  Osteoporosis.  No back pain.  No joint pain.  No muscle tenderness. Extremities:  No pain or swelling. Skin:  Decubitus ulcer,  nearly healed.  No rashes or skin changes. Neuro:  General weakness.  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  No headache, focal weakness, balance or coordination issues. Endocrine:  Diabetes on insulin pump.  No thyroid issues, hot flashes or night sweats. Psych:  No mood changes, depression or anxiety. Pain:  No focal pain. Review of systems:  All other systems reviewed and found to be negative.  Physical Exam:  Blood pressure 152/55, pulse 90, temperature 97.9 F (36.6 C), temperature source Oral, resp. rate 18, height _0  (1.6 m), weight 152 lb 1.9 oz (69 kg), SpO2 92 %.  GENERAL:  Well developed, well nourished, sitting comfortably on the medical unit in no acute distress. MENTAL STATUS:  Alert and oriented to person, place and time. HEAD:  Alopecia totalis.  Normocephalic, atraumatic, face symmetric, no Cushingoid features. EYES:  Blue eyes.  Pupils equal round and reactive to light and accomodation.  No conjunctivitis or scleral icterus. ENT:  Canadian in place.  Oropharynx clear without lesion.  Tongue normal. Mucous membranes moist.  RESPIRATORY:  Decreased breath sounds 1/2-1/3 up.  Otherwise, clear to auscultation without rales, wheezes or rhonchi. CARDIOVASCULAR:  Regular rate and rhythm without murmur, rub or gallop. ABDOMEN:  Well healed midline incision.  Left lower quadrant ostomy.  Soft, non-tender, with active bowel sounds, and no hepatosplenomegaly.  No masses. SKIN:  No rashes, ulcers or lesions. EXTREMITIES: 1-2+ bilateral lower extremity edema (improved per patient).  No skin discoloration or tenderness.  No palpable cords. LYMPH NODES: No palpable cervical, supraclavicular, axillary or inguinal adenopathy  NEUROLOGICAL: Unremarkable. PSYCH:  Appropriate.  Results for orders placed or performed during the hospital encounter of 07/01/15 (from the past 48 hour(s))  Basic metabolic panel     Status: Abnormal   Collection Time: 07/01/15  2:45 PM  Result Value  Ref Range   Sodium 134 (L) 135 - 145 mmol/L   Potassium 3.6 3.5 - 5.1 mmol/L   Chloride 98 (L) 101 - 111 mmol/L   CO2 27 22 - 32 mmol/L   Glucose, Bld 267 (H) 65 - 99 mg/dL   BUN 16 6 - 20 mg/dL   Creatinine, Ser 0.69 0.44 - 1.00 mg/dL   Calcium 8.8 (L) 8.9 - 10.3 mg/dL   GFR calc non Af Amer >60 >60 mL/min   GFR calc Af Amer >60 >60 mL/min    Comment: (NOTE) The eGFR has been calculated using the CKD EPI  equation. This calculation has not been validated in all clinical situations. eGFR's persistently <60 mL/min signify possible Chronic Kidney Disease.    Anion gap 9 5 - 15  CBC     Status: Abnormal   Collection Time: 07/01/15  2:45 PM  Result Value Ref Range   WBC 5.4 3.6 - 11.0 K/uL   RBC 3.31 (L) 3.80 - 5.20 MIL/uL   Hemoglobin 9.9 (L) 12.0 - 16.0 g/dL   HCT 29.3 (L) 35.0 - 47.0 %   MCV 88.5 80.0 - 100.0 fL   MCH 29.9 26.0 - 34.0 pg   MCHC 33.8 32.0 - 36.0 g/dL   RDW 16.3 (H) 11.5 - 14.5 %   Platelets 393 150 - 440 K/uL  Troponin I     Status: Abnormal   Collection Time: 07/01/15  2:45 PM  Result Value Ref Range   Troponin I 0.04 (H) <0.031 ng/mL    Comment: READ BACK AND VERIFIED WITH ASHLEY SMITH ON 07/01/15 AT 1614 BY TLB        PERSISTENTLY INCREASED TROPONIN VALUES IN THE RANGE OF 0.04-0.49 ng/mL CAN BE SEEN IN:       -UNSTABLE ANGINA       -CONGESTIVE HEART FAILURE       -MYOCARDITIS       -CHEST TRAUMA       -ARRYHTHMIAS       -LATE PRESENTING MYOCARDIAL INFARCTION       -COPD   CLINICAL FOLLOW-UP RECOMMENDED.   Brain natriuretic peptide     Status: Abnormal   Collection Time: 07/01/15  2:45 PM  Result Value Ref Range   B Natriuretic Peptide 237.0 (H) 0.0 - 100.0 pg/mL  Glucose, capillary     Status: Abnormal   Collection Time: 07/01/15  9:26 PM  Result Value Ref Range   Glucose-Capillary 211 (H) 65 - 99 mg/dL  Glucose, capillary     Status: Abnormal   Collection Time: 07/02/15  7:58 AM  Result Value Ref Range   Glucose-Capillary 226 (H) 65 - 99  mg/dL   Comment 1 Notify RN   Glucose, capillary     Status: Abnormal   Collection Time: 07/02/15 12:07 PM  Result Value Ref Range   Glucose-Capillary 161 (H) 65 - 99 mg/dL   Comment 1 Notify RN   APTT     Status: None   Collection Time: 07/02/15  3:53 PM  Result Value Ref Range   aPTT 24 24 - 36 seconds  Protime-INR     Status: None   Collection Time: 07/02/15  3:53 PM  Result Value Ref Range   Prothrombin Time 13.9 11.4 - 15.0 seconds   INR 1.05    Dg Chest 2 View  07/01/2015  CLINICAL DATA:  Shortness of Breath EXAM: CHEST  2 VIEW COMPARISON:  November 20, 2012 FINDINGS: There is a large right pleural effusion with consolidation throughout the right middle and lower lobes. The left lung is clear. Heart size and pulmonary vascularity are normal. No adenopathy. Port-A-Cath tip is in the superior vena cava. Patient is status post coronary artery bypass grafting. No adenopathy evident. There is stable anterior wedging of the T12 vertebral body. IMPRESSION: Large right pleural effusion with consolidation in the right middle and lower lobes, probably enlarged part due to compressive atelectasis. Electronically Signed   By: Lowella Grip III M.D.   On: 07/01/2015 15:47    Assessment:  The patient is a 70 y.o. woman with stage IIIC (T3cN1Mx) ovarian cancer  s/p 4 cycles of neoadjuvant carboplatin and Taxol (03/05/2015 - 05/22/2015). She underwent debulking surgery on 06/15/2015.  During removal of tumor from the right diaphragm, the diaphragm was entered then subsequently closed.  She underwent thoracentesis on post operative day 3 for right sided pleural effusion (unclear if studies sent).  She has a recurrent right sided pleural effusion (? malignant).  She has new bilateral lower extremity edema, improved since hospitalization.  Plan:   1.  Hematology/Oncology:  Thoracentesis (diagnostic and therapeutic) for recurrent right sided pleural effusion.   Send fluid for study (cell count and dif,  glucose, protein, LDH, cytology, and culture).    Bilateral lower extremity ultrasound.  Anticipate reinstitution of outpatient chemotherapy after discharge.   Thank you for allowing me to participate in [name] 's care.  I will follow her closely with you while hospitalized and after discharge in the outpatient department.  Lequita Asal, MD  07/02/2015, 5:41 PM

## 2015-07-02 NOTE — Progress Notes (Signed)
[facility] [place] NAME: [name]    MR#:  [id number]  DATE OF BIRTH:  1946-04-25  SUBJECTIVE:   Pt. Here due to shortness of breath and noted to have a large right sided pleural effusion and compressive atelectasis.    REVIEW OF SYSTEMS:    Review of Systems  Constitutional: Negative for fever and chills.  HENT: Negative for congestion and tinnitus.   Eyes: Negative for blurred vision and double vision.  Respiratory: Positive for shortness of breath. Negative for cough and wheezing.   Cardiovascular: Negative for chest pain, orthopnea and PND.  Gastrointestinal: Negative for nausea, vomiting, abdominal pain and diarrhea.  Genitourinary: Negative for dysuria and hematuria.  Neurological: Negative for dizziness, sensory change and focal weakness.  All other systems reviewed and are negative.   Nutrition: Carb control Tolerating Diet: Yes Tolerating PT: Await Eval.    DRUG ALLERGIES:   Allergies  Allergen Reactions  . Codeine Nausea And Vomiting    VITALS:  Blood pressure 152/55, pulse 90, temperature 97.9 F (36.6 C), temperature source Oral, resp. rate 18, height 5\' 3"  (1.6 m), weight 69 kg (152 lb 1.9 oz), SpO2 92 %.  PHYSICAL EXAMINATION:   Physical Exam  GENERAL:  70 y.o.-year-old patient lying in the bed in no acute distress.  EYES: Pupils equal, round, reactive to light and accommodation. No scleral icterus. Extraocular muscles intact.  HEENT: Head atraumatic, normocephalic. Oropharynx and nasopharynx clear.  NECK:  Supple, no jugular venous distention. No thyroid enlargement, no tenderness.  LUNGS: Poor a/e on the right mid to lower lung fields, no wheezing, rales, rhonchi. No use of accessory muscles of respiration.  CARDIOVASCULAR: S1, S2 normal. No murmurs, rubs, or gallops.  ABDOMEN: Soft, nontender, nondistended. Bowel sounds present. No organomegaly or mass.  EXTREMITIES: No cyanosis, clubbing or  edema b/l.    NEUROLOGIC: Cranial nerves II through XII are intact. No focal Motor or sensory deficits b/l.   PSYCHIATRIC: The patient is alert and oriented x 3. Good affect.  SKIN: No obvious rash, lesion, or ulcer.    LABORATORY PANEL:   CBC  Recent Labs Lab 07/01/15 1445  WBC 5.4  HGB 9.9*  HCT 29.3*  PLT 393   ------------------------------------------------------------------------------------------------------------------  Chemistries   Recent Labs Lab 07/01/15 1445  NA 134*  K 3.6  CL 98*  CO2 27  GLUCOSE 267*  BUN 16  CREATININE 0.69  CALCIUM 8.8*   ------------------------------------------------------------------------------------------------------------------  Cardiac Enzymes  Recent Labs Lab 07/01/15 1445  TROPONINI 0.04*   ------------------------------------------------------------------------------------------------------------------  RADIOLOGY:  Dg Chest 2 View  07/01/2015  CLINICAL DATA:  Shortness of Breath EXAM: CHEST  2 VIEW COMPARISON:  November 20, 2012 FINDINGS: There is a large right pleural effusion with consolidation throughout the right middle and lower lobes. The left lung is clear. Heart size and pulmonary vascularity are normal. No adenopathy. Port-A-Cath tip is in the superior vena cava. Patient is status post coronary artery bypass grafting. No adenopathy evident. There is stable anterior wedging of the T12 vertebral body. IMPRESSION: Large right pleural effusion with consolidation in the right middle and lower lobes, probably enlarged part due to compressive atelectasis. Electronically Signed   By: Lowella Grip III M.D.   On: 07/01/2015 15:47     ASSESSMENT AND PLAN:   [name] is a 70 y.o. female with a known history of advanced high-grade serous ovarian cancer status post 4 cycles of chemotherapy and interval debulking status post  diagnostic laparoscopy exploratory laparotomy lysis of adhesion total abdominal hysterectomy  with bilateral salpingo-oophorectomy optimal tumor debulking rectosigmoid resection with creation of end colostomy on 06/15/2015 at [facility]  #1 pleural effusion-likely malignant nature given a history of advanced ovarian cancer. -Status post recent thoracentesis. Await cardiothoracic surgery consultation regarding further management. Patient may benefit from pleurodesis or Pleurx catheter placement.  #2 advanced ovarian cancer-patient follows with Dr. Mike Gip. -Continue supportive care and oncology has been consulted.  #3 diabetes type 2 - on insulin pump.  Will consult Endocrinology.  - cont. SSI and her insulin pump.   4. Hypertension - cont. Norvasc, Losartan, Metoprolol  - bp stable.   5. Neuropathy - cont. Lyrica.   6. Lower extremity edema continue Lasix  7. Hyperlipidemia - cont. Atorvastatin.     All the records are reviewed and case discussed with Care Management/Social Workerr. Management plans discussed with the patient, family and they are in agreement.  CODE STATUS: Full   DVT Prophylaxis: Lovenox.   TOTAL TIME TAKING CARE OF THIS PATIENT: 30 minutes.   POSSIBLE D/C IN 2-3 DAYS, DEPENDING ON CLINICAL CONDITION.   Henreitta Leber M.D on 07/02/2015 at 5:21 PM  Between 7am to 6pm - Pager - [phone]  After 6pm go to [email or web address] - password [email or web address] [facility]  Office  [phone]  CC: Primary care physician; Rica Mast, MD

## 2015-07-02 NOTE — Procedures (Signed)
US guided right thoracentesis.  Removed 1.1 liters of amber colored fluid.  No immediate complication.  CXR pending.

## 2015-07-02 NOTE — Progress Notes (Signed)
Notified Dr. Posey Pronto that pt does not have lovenox ordered as the MD progress notes say - pt also takes this medication at home. MD gave order for lovenox 40mg  daily.

## 2015-07-02 NOTE — Progress Notes (Signed)
Inpatient Diabetes Program Recommendations  AACE/ADA: New Consensus Statement on Inpatient Glycemic Control (2015)  Target Ranges:  Prepandial:   less than 140 mg/dL      Peak postprandial:   less than 180 mg/dL (1-2 hours)      Critically ill patients:  140 - 180 mg/dL   Review of Glycemic Control  Results for [name], [name] (MRN [id number]) as of 07/02/2015 08:53  Ref. Range 07/01/2015 21:26 07/02/2015 07:58  Glucose-Capillary Latest Ref Range: 65-99 mg/dL 211 (H) 226 (H)     70 y.o. female with Type 1 diabetes, patient of Dr. Gabriel Carina. Per Dr. Joycie Peek note dated 05/28/15- "7.7% which is stable from prior. Diabetes is controlled. She has no known complications from diabetes. Last dilated eye exam was in 12/2014. She has a MiniMed Medtronic Paradigm 523 insulin pump. Her insulin pump was downloaded and reviewed:  Basal rate  12:00 a.m. 0.125 units an hour 2:00 a.m. 0.225 units an hour 5:00 am 0.8 units an hour 11 am 0.75 units an hour 3:00 p.m. 0.375 units an hour total daily basal rate of 12.025 units.  Bolus settings 1:C ratio 12 AM 1:8.5, 4 PM 1:7.5 Sensitivity 52 Target 120-120 Active insulin time 4 hours  The patient has not changed her basal settings since Dr. Joycie Peek office visit.  She complains of chronically high blood sugars since surgery.  Site last changed on February 27th and due to be changed day- she does not have supplies here at the hospital but her husband is willing to go home and get them.   Consider consulting Dr. Gabriel Carina while patient is inpatient.  Gentry Fitz, RN, BA, MHA, CDE Diabetes Coordinator Inpatient Diabetes Program  [phone] (Team Pager) [phone] ([place]) 07/02/2015 8:56 AM

## 2015-07-03 ENCOUNTER — Inpatient Hospital Stay: Payer: Medicare Other

## 2015-07-03 ENCOUNTER — Encounter
Admission: EM | Disposition: A | Discharge status: Home / Self Care | Payer: Self-pay | Source: Home / Self Care | Attending: Specialist

## 2015-07-03 LAB — GLUCOSE, CAPILLARY
GLUCOSE-CAPILLARY: 365 mg/dL — AB (ref 65–99)
Glucose-Capillary: 324 mg/dL — ABNORMAL HIGH (ref 65–99)

## 2015-07-03 LAB — BASIC METABOLIC PANEL
Anion gap: 11 (ref 5–15)
BUN: 12 mg/dL (ref 6–20)
CALCIUM: 8.1 mg/dL — AB (ref 8.9–10.3)
CO2: 26 mmol/L (ref 22–32)
CREATININE: 0.72 mg/dL (ref 0.44–1.00)
Chloride: 99 mmol/L — ABNORMAL LOW (ref 101–111)
GFR calc Af Amer: >60 mL/min (ref >60)
Glucose, Bld: 357 mg/dL — ABNORMAL HIGH (ref 65–99)
POTASSIUM: 3.6 mmol/L (ref 3.5–5.1)
SODIUM: 136 mmol/L (ref 135–145)

## 2015-07-03 SURGERY — VIDEO ASSISTED THORACOSCOPY (VATS) W/TALC PLEUADESIS
Anesthesia: General | Laterality: Right

## 2015-07-03 MED ORDER — CHLORHEXIDINE GLUCONATE 4 % EX LIQD: 1.0000 "application " | Freq: Once | CUTANEOUS | Status: DC

## 2015-07-03 MED ORDER — CEFAZOLIN SODIUM-DEXTROSE 2-3 GM-% IV SOLR
2.0000 g | INTRAVENOUS | Status: DC
  Filled 2015-07-03: qty 50

## 2015-07-03 MED ORDER — INSULIN GLARGINE 100 UNIT/ML ~~LOC~~ SOLN
12.0000 [IU] | Freq: Every day | SUBCUTANEOUS | Status: DC
  Filled 2015-07-03: qty 0.12

## 2015-07-03 MED ORDER — INSULIN ASPART 100 UNIT/ML ~~LOC~~ SOLN: 0.0000 [IU] | Freq: Three times a day (TID) | SUBCUTANEOUS | Status: DC

## 2015-07-03 MED ORDER — FLEET ENEMA 7-19 GM/118ML RE ENEM: 1.0000 | ENEMA | Freq: Once | RECTAL | Status: DC

## 2015-07-03 MED ORDER — INSULIN ASPART 100 UNIT/ML ~~LOC~~ SOLN: 0.0000 [IU] | Freq: Every day | SUBCUTANEOUS | Status: DC

## 2015-07-03 MED ORDER — INSULIN PUMP
SUBCUTANEOUS | Status: DC
  Administered 2015-07-03: 12:00:00 via SUBCUTANEOUS
  Filled 2015-07-03: qty 1

## 2015-07-03 NOTE — Progress Notes (Signed)
Inpatient Diabetes Program Recommendations  AACE/ADA: New Consensus Statement on Inpatient Glycemic Control (2015)  Target Ranges:  Prepandial:   less than 140 mg/dL      Peak postprandial:   less than 180 mg/dL (1-2 hours)      Critically ill patients:  140 - 180 mg/dL   Review of Glycemic Control  Results for [name], [name] (MRN [id number]) as of 07/03/2015 11:41  Ref. Range 07/02/2015 12:07 07/02/2015 18:22 07/02/2015 20:58 07/03/2015 06:21 07/03/2015 07:42  Glucose-Capillary Latest Ref Range: 65-99 mg/dL 161 (H) 168 (H) 296 (H) 324 (H) 365 (H)   Met with the patient and her husband- she has restarted her insulin pump and has bolused herself 10 units of Humalog insulin (she brought insulin from home).   Gentry Fitz, RN, BA, MHA, CDE Diabetes Coordinator Inpatient Diabetes Program  [phone] (Team Pager) [phone] ([place]) 07/03/2015 11:42 AM   Inpatient Diabetes Program Recommendations:

## 2015-07-03 NOTE — Care Management Important Message (Signed)
Important Message  Patient Details  Name: [name] MRN: [id number] Date of Birth: February 11, 1946   Medicare Important Message Given:  Yes    Juliann Pulse A Ethelwyn Gilbertson 07/03/2015, 10:26 AM

## 2015-07-03 NOTE — Progress Notes (Signed)
Spoke with Diabetes Coordinator - she stated to check patient's CGB  - will check, speak with MD, and cover as appropriate if needed.

## 2015-07-03 NOTE — Progress Notes (Addendum)
Inpatient Diabetes Program Recommendations  AACE/ADA: New Consensus Statement on Inpatient Glycemic Control (2015)  Target Ranges:  Prepandial:   less than 140 mg/dL      Peak postprandial:   less than 180 mg/dL (1-2 hours)      Critically ill patients:  140 - 180 mg/dL   Review of Glycemic Control  Results for [name], [name] (MRN [id number]) as of 07/03/2015 06:26  Ref. Range 07/02/2015 07:58 07/02/2015 12:07 07/02/2015 18:22 07/02/2015 20:58 07/03/2015 06:21  Glucose-Capillary Latest Ref Range: 65-99 mg/dL 226 (H) 161 (H) 168 (H) 296 (H) 324 (H)    Inpatient Diabetes Program Recommendations:  Patient came off insulin pump yesterday at some point. Husband did not bring more insulin pump supplies. She does not appear to have received basal insulin.  No insulin given pre-supper and blood sugar was not checked through the night.  Spoke to RN this morning- blood sugar obtained.  Recommend have patient have her husband bring the pump so she can be re-started immediately.  If he is not willing to bring it before 7:30 this morning, patient will need to be started on Lantus insulin 12 unit immediately this am. Mealtime insulin should be ordered as 1 unit of Novolog insulin for every 9 grams of carb she eats Correction Novolog insulin should be ordered as  1 unit for blood sugars 150-200mg /dl         2 units for blood sugars 201-250         3 units for blood sugar 251-300         4 units for blood sugar 301-350         5 units for blood sugar 351 and greater  Gentry Fitz, RN, [place], [place], CDE Diabetes Coordinator Inpatient Diabetes Program  [phone] (Team Pager) [phone] ([place]) 07/03/2015 7:00 AM   [facility] Office- [phone]

## 2015-07-03 NOTE — Progress Notes (Signed)
Pt d/c to home today.  Colostomy bag intact.  IV removed intact.  Rx's given to pt w/all questions and concerns addressed.  D/C paperwork reviewed and education provided with all questions and concerns addressed.  Pt husband at bedside for home transport.  Volunteer services contact for transportation from room to exit.

## 2015-07-03 NOTE — Discharge Summary (Signed)
[facility] [place] NAME: [name]    MR#:  [id number]  DATE OF BIRTH:  1945-10-03  DATE OF ADMISSION:  07/01/2015 ADMITTING PHYSICIAN: Janet Mandes, MD  DATE OF DISCHARGE: 07/03/2015  1:35 PM  PRIMARY CARE PHYSICIAN: Janet Mast, MD    ADMISSION DIAGNOSIS:  Hypoxia [R09.02] Recurrent right pleural effusion [J90]  DISCHARGE DIAGNOSIS:  Active Problems:   Pleural effusion   SECONDARY DIAGNOSIS:   Past Medical History  Diagnosis Date  . Leukopenia 2012    s/p bone marrow biopsy, Dr. Ma [name]  . Cholelithiasis   . GERD (gastroesophageal reflux disease)   . Hypertension   . Hyperlipidemia   . Esophageal stricture   . Chronic diastolic CHF (congestive heart failure) ([place])   . CAD (coronary artery disease)     a. 09/2012 Cath: LM nl, LAD 95p, 21m LCX 959mOM2 50, RCA 100.  . Marland Kitchenypokalemia   . Herniated disc   . Pneumonia 2013; 08/2012    "one lung; double" (10/18/2012)  . Exertional shortness of breath   . [place]    "[place]" (10/18/2012)  . History of pancytopenia   . H/O hiatal hernia   . Rheumatoid arthritis(714.0)   . [place] elevated [place]    "mild" (10/18/2012)  . Collagen vascular disease ([facility]  . [name]    HOSPITAL COURSE:   [name] a [age] 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 -Patient underwent a ultrasound guided thoracentesis would 1.1 L of fluid removed. Fluid was  sent off for cytology but there is also still pending and she will follow-up with Dr. CoMike [name] an outpatient. -She clinically feels better and her shortness of breath is improved. Patient was seen by cardiothoracic surgery and will follow up with them as an outpatient if there effusion is malignant for possible need for pleurodesis versus Pleurx catheter placement.  #2 advanced [name]-patient follows with [name]-No acute issues related to this while in the hospital.  #3 diabetes type 2 - patient is on Janet pump and use that while in the hospital. -She will continue her Janet pump per protocol and follow-up with her endocrinologist Dr. SoGabriel [name]  4. Hypertension - she will continue  [name], [name], [name]    5. Neuropathy - she will cont. [name].   6. Hyperlipidemia - she will cont. [name].    DISCHARGE CONDITIONS:   Stable.   CONSULTS OBTAINED:  Treatment Team:  Janet AsalMD  DRUG ALLERGIES:   Allergies  Allergen Reactions  . [name]    DISCHARGE MEDICATIONS:   Discharge Medication List as of 07/03/2015  1:10 PM    CONTINUE these medications which have NOT CHANGED   Details  amLODipine ([name]) 5 MG tablet Take 5 mg by mouth daily., Until Discontinued, Historical Med    [name] (LIPITOR) 80 MG tablet Take 80 mg by mouth every evening., Until Discontinued, Historical Med    Calcium Carbonate-Vitamin D (CALCIUM 600+D) 600-400 MG-UNIT tablet Take 1 tablet by mouth 2 (two)  times daily., Until Discontinued, Historical Med    cholecalciferol (VITAMIN D) 1000 units tablet Take 2,000 Units by mouth daily., Until Discontinued, Historical Med    enoxaparin (LOVENOX) 40 MG/0.4ML injection Inject 40 mg into the skin daily. , Until Discontinued, Historical Med    furosemide (LASIX) 40 MG tablet Take 1 tablet (40 mg total) by mouth 2 (two) times daily., Starting 05/15/2014, Until Discontinued, Normal    [name]  (NEURONTIN) 300 MG capsule Take 300 mg by mouth 2 (two) times daily. , Until Discontinued, Historical Med    ibuprofen (ADVIL,MOTRIN) 800 MG tablet Take 800 mg by mouth 3 (three) times daily as needed for mild pain., Until Discontinued, Historical Med    [name] (Janet PUMP) SOLN Pt uses [name] Janet., Until Discontinued, Historical Med    lidocaine-prilocaine (EMLA) cream Apply 1 application topically as needed (prior to accessing port)., Until Discontinued, Historical Med    loratadine (CLARITIN) 10 MG tablet Take 10 mg by mouth daily., Until Discontinued, Historical Med    [name] (COZAAR) 100 MG tablet Take 1 tablet (100 mg total) by mouth daily., Starting 05/15/2014, Until Discontinued, Normal    [name] tartrate (LOPRESSOR) 25 MG tablet Take 25 mg by mouth 2 (two) times daily., Until Discontinued, Historical Med    Multiple Vitamin (MULTIVITAMIN WITH MINERALS) TABS tablet Take 1 tablet by mouth daily., Until Discontinued, Historical Med    ondansetron (ZOFRAN) 8 MG tablet Take 1 tablet (8 mg total) by mouth every 8 (eight) hours as needed for nausea or [name]., Starting 03/06/2015, Until Discontinued, Normal    pantoprazole (PROTONIX) 40 MG tablet Take 40 mg by mouth every evening., Until Discontinued, Historical Med    potassium chloride SA (K-DUR,KLOR-CON) 20 MEQ tablet Take 20 mEq by mouth 2 (two) times daily., Until Discontinued, Historical Med    ranitidine (ZANTAC) 150 MG tablet Take 1 tablet (150 mg total) by mouth 2 (two) times daily., Starting 11/13/2014, Until Discontinued, Normal    zoledronic acid (RECLAST) 5 MG/100ML SOLN Inject 5 mg into the vein See admin instructions. Pt gets an injection once per year., Until Discontinued, Historical Med      STOP taking these medications     LORazepam (ATIVAN) 0.5 MG tablet      magnesium oxide (MAG-OX) 400 MG tablet      pregabalin ([name]) 75 MG capsule      saccharomyces boulardii (FLORASTOR) 250 MG capsule       vitamin C (ASCORBIC ACID) 500 MG tablet          DISCHARGE INSTRUCTIONS:   DIET:  Diabetic diet  DISCHARGE CONDITION:  Stable  ACTIVITY:  Activity as tolerated  OXYGEN:  Home Oxygen: No.   Oxygen Delivery: room air  DISCHARGE LOCATION:  home   If you experience worsening of your admission symptoms, develop shortness of breath, life threatening emergency, suicidal or homicidal thoughts you must seek medical attention immediately by calling 911 or calling your MD immediately  if symptoms less severe.  You Must read complete instructions/literature along with all the possible adverse reactions/side effects for all the Medicines you take and that have been prescribed to you. Take any new Medicines after you have completely understood and accpet all the possible adverse reactions/side effects.   Please note  You were cared for by a hospitalist during your hospital stay. If you have any questions about your discharge medications or the care you received while you were in the hospital after you are discharged, you can call the  unit and asked to speak with the hospitalist on call if the hospitalist that took care of you is not available. Once you are discharged, your primary care physician will handle any further medical issues. Please note that NO REFILLS for any discharge medications will be authorized once you are discharged, as it is imperative that you return to your primary care physician (or establish a relationship with a primary care physician if you do not have one) for your aftercare needs so that they can reassess your need for medications and monitor your lab values.     Today    VITAL SIGNS:  Blood pressure 155/51, pulse 87, temperature 98.2 F (36.8 C), temperature source Oral, resp. rate 17, height 5' 3"  (1.6 m), weight 69 kg (152 lb 1.9 oz), SpO2 94 %.  I/O:   Intake/Output Summary (Last 24 hours) at 07/03/15 1734 Last data filed at 07/03/15 1600  Gross  per 24 hour  Intake    840 ml  Output   2750 ml  Net  -1910 ml    PHYSICAL EXAMINATION:  GENERAL:  70 y.o.-year-old patient lying in the bed with no acute distress.  EYES: Pupils equal, round, reactive to light and accommodation. No scleral icterus. Extraocular muscles intact.  HEENT: Head atraumatic, normocephalic. Oropharynx and nasopharynx clear.  NECK:  Supple, no jugular venous distention. No thyroid enlargement, no tenderness.  LUNGS: Poor air entry in the right lower lung field, no wheezing, rales,rhonchi. No use of accessory muscles of respiration.  CARDIOVASCULAR: S1, S2 normal. No murmurs, rubs, or gallops.  ABDOMEN: Soft, non-tender, non-distended. Bowel sounds present. No organomegaly or mass.  EXTREMITIES: No pedal edema, cyanosis, or clubbing.  NEUROLOGIC: Cranial nerves II through XII are intact. No focal motor or sensory defecits b/l.  PSYCHIATRIC: The patient is alert and oriented x 3. SKIN: No obvious rash, lesion, or ulcer.   DATA REVIEW:   CBC  Recent Labs Lab 07/01/15 1445  WBC 5.4  HGB 9.9*  HCT 29.3*  PLT 393    Chemistries   Recent Labs Lab 07/03/15 0548  NA 136  K 3.6  CL 99*  CO2 26  GLUCOSE 357*  BUN 12  CREATININE 0.72  CALCIUM 8.1*    Cardiac Enzymes  Recent Labs Lab 07/01/15 1445  TROPONINI 0.04*    Microbiology Results  Results for orders placed or performed during the hospital encounter of 07/01/15  Body fluid culture     Status: None (Preliminary result)   Collection Time: 07/02/15  3:25 PM  Result Value Ref Range Status   Specimen Description PLEURAL  Final   Special Requests Normal  Final   Gram Stain   Final    MODERATE RED BLOOD CELLS MODERATE WBC SEEN NO ORGANISMS SEEN    Culture PENDING  Incomplete   Report Status PENDING  Incomplete    RADIOLOGY:  Dg Chest 1 View  07/02/2015  CLINICAL DATA:  Post right thoracentesis. EXAM: CHEST 1 VIEW COMPARISON:  07/01/2015 FINDINGS: Right pleural effusion has decreased  in size but there is still a small-to-moderate amount of right pleural fluid. Negative for a large right pneumothorax. Port-A-Cath tip near the superior cavoatrial junction. Heart size is enlarged and unchanged. Evidence of prior CABG procedure. Left lung remains clear. Again noted is a small amount of bowel gas near the right hemidiaphragm. IMPRESSION: Decreased right pleural fluid following thoracentesis. Negative for pneumothorax. Electronically Signed   By: Markus Daft M.D.   On: 07/02/2015 18:05   US  Venous Img Lower Bilateral  07/03/2015  CLINICAL DATA:  Bilateral lower extremity edema. History of [name]. Evaluate for DVT. EXAM: BILATERAL LOWER EXTREMITY VENOUS DOPPLER ULTRASOUND TECHNIQUE: Gray-scale sonography with graded compression, as well as color Doppler and duplex ultrasound were performed to evaluate the lower extremity deep venous systems from the level of the common femoral vein and including the common femoral, femoral, profunda femoral, popliteal and calf veins including the posterior tibial, peroneal and gastrocnemius veins when visible. The superficial great saphenous vein was also interrogated. Spectral Doppler was utilized to evaluate flow at rest and with distal augmentation maneuvers in the common femoral, femoral and popliteal veins. COMPARISON:  None. FINDINGS: RIGHT LOWER EXTREMITY Common Femoral Vein: No evidence of thrombus. Normal compressibility, respiratory phasicity and response to augmentation. Saphenofemoral Junction: No evidence of thrombus. Normal compressibility and flow on color Doppler imaging. Profunda Femoral Vein: No evidence of thrombus. Normal compressibility and flow on color Doppler imaging. Femoral Vein: No evidence of thrombus. Normal compressibility, respiratory phasicity and response to augmentation. Popliteal Vein: No evidence of thrombus. Normal compressibility, respiratory phasicity and response to augmentation. Calf Veins: No evidence of thrombus.  Normal compressibility and flow on color Doppler imaging. Superficial Great Saphenous Vein: No evidence of thrombus. Normal compressibility and flow on color Doppler imaging. Venous Reflux:  None. Other Findings:  None. LEFT LOWER EXTREMITY Common Femoral Vein: No evidence of thrombus. Normal compressibility, respiratory phasicity and response to augmentation. Saphenofemoral Junction: No evidence of thrombus. Normal compressibility and flow on color Doppler imaging. Profunda Femoral Vein: No evidence of thrombus. Normal compressibility and flow on color Doppler imaging. Femoral Vein: No evidence of thrombus. Normal compressibility, respiratory phasicity and response to augmentation. Popliteal Vein: No evidence of thrombus. Normal compressibility, respiratory phasicity and response to augmentation. Calf Veins: No evidence of thrombus. Normal compressibility and flow on color Doppler imaging. Superficial Great Saphenous Vein: No evidence of thrombus. Normal compressibility and flow on color Doppler imaging. Venous Reflux:  None. Other Findings: Note is made of a serpiginous approximately 2.9 x 0.9 x 2.1 cm anechoic fluid collection with the left popliteal fossa which is favored to represent a Baker cyst. IMPRESSION: 1. No evidence of DVT within either lower extremity. 2. No made of an approximately 2.9 cm left-sided Baker cyst. Electronically Signed   By: Sandi Mariscal M.D.   On: 07/03/2015 10:19   US Thoracentesis Asp Pleural Space W/img Guide  07/02/2015  INDICATION: Dyspnea.  Large right pleural effusion. EXAM: ULTRASOUND GUIDED RIGHT THORACENTESIS MEDICATIONS: None. COMPLICATIONS: None immediate. PROCEDURE: An ultrasound guided thoracentesis was thoroughly discussed with the patient and questions answered. The benefits, risks, alternatives and complications were also discussed. The patient understands and wishes to proceed with the procedure. Written consent was obtained. Ultrasound was performed to localize and  mark an adequate pocket of fluid in the right posterior chest. The area was then prepped and draped in the normal sterile fashion. 1% Lidocaine was used for local anesthesia. Under ultrasound guidance a Safe-T-Centesis catheter catheter was introduced. Thoracentesis was performed. The procedure was stopped due to patient discomfort and coughing. The catheter was removed and a dressing applied. FINDINGS: A total of approximately 1.1 L of amber colored fluid was removed. IMPRESSION: Successful ultrasound guided right thoracentesis yielding 1.1 L of pleural fluid. Electronically Signed   By: Markus Daft M.D.   On: 07/02/2015 17:50      Management plans discussed with the patient, family and they are in agreement.  CODE STATUS:  Code Status  History    Date Active Date Inactive Code Status Order ID Comments User Context   07/01/2015  8:06 PM 07/03/2015  4:36 PM Full Code [id number]  Janet Mandes, MD Inpatient   03/02/2015 10:28 AM 03/03/2015 10:50 AM Full Code [id number]  Algernon Huxley, MD Inpatient   10/19/2012  3:03 PM 10/21/2012 12:23 PM Full Code [id number]  Nani Skillern, PA-C Inpatient    Advance Directive Documentation        Most Recent Value   Type of Advance Directive  Healthcare Power of Attorney   Pre-existing out of facility DNR order (yellow form or pink MOST form)     "MOST" Form in Place?        TOTAL TIME TAKING CARE OF THIS PATIENT: 40 minutes.    Henreitta Leber M.D on 07/03/2015 at 5:34 PM  Between 7am to 6pm - Pager - [phone]  After 6pm go to [email or web address] - password [email or web address] [facility]  Office  [phone]  CC: Primary care physician; Janet Mast, MD

## 2015-07-06 ENCOUNTER — Telehealth: Payer: Self-pay | Admitting: *Deleted

## 2015-07-06 DIAGNOSIS — C569 Malignant neoplasm of unspecified ovary: Secondary | ICD-10-CM

## 2015-07-06 DIAGNOSIS — C786 Secondary malignant neoplasm of retroperitoneum and peritoneum: Secondary | ICD-10-CM

## 2015-07-06 DIAGNOSIS — C801 Malignant (primary) neoplasm, unspecified: Secondary | ICD-10-CM

## 2015-07-06 LAB — CYTOLOGY - NON PAP

## 2015-07-06 NOTE — Telephone Encounter (Signed)
Wanted to let Dr Mike Gip know that her O2 sat dropping to 80% with ambulation

## 2015-07-06 NOTE — Telephone Encounter (Signed)
CXR ordered per VO Dr Mike Gip pt informed to get CXR today or tomorrow, she said she will do it tomorrow since she will be at [facility] anyway

## 2015-07-07 ENCOUNTER — Other Ambulatory Visit: Payer: Self-pay

## 2015-07-07 ENCOUNTER — Telehealth: Payer: Self-pay

## 2015-07-07 ENCOUNTER — Ambulatory Visit
Admission: RE | Admit: 2015-07-07 | Discharge: 2015-07-07 | Disposition: A | Discharge status: Home / Self Care | Payer: Medicare Other | Source: Ambulatory Visit | Attending: Hematology and Oncology | Admitting: Hematology and Oncology

## 2015-07-07 ENCOUNTER — Inpatient Hospital Stay
Admission: EM | Admit: 2015-07-07 | Discharge: 2015-07-09 | DRG: 187 | Disposition: A | Discharge status: Home Health | Payer: Medicare Other | Attending: Internal Medicine | Admitting: Internal Medicine

## 2015-07-07 ENCOUNTER — Other Ambulatory Visit: Payer: Self-pay | Admitting: Hematology and Oncology

## 2015-07-07 ENCOUNTER — Telehealth: Payer: Self-pay | Admitting: Hematology and Oncology

## 2015-07-07 DIAGNOSIS — Z79899 Other long term (current) drug therapy: Secondary | ICD-10-CM

## 2015-07-07 DIAGNOSIS — Z8261 Family history of arthritis: Secondary | ICD-10-CM

## 2015-07-07 DIAGNOSIS — Z9841 Cataract extraction status, right eye: Secondary | ICD-10-CM | POA: Diagnosis not present

## 2015-07-07 DIAGNOSIS — C569 Malignant neoplasm of unspecified ovary: Secondary | ICD-10-CM | POA: Diagnosis present

## 2015-07-07 DIAGNOSIS — Z9049 Acquired absence of other specified parts of digestive tract: Secondary | ICD-10-CM

## 2015-07-07 DIAGNOSIS — Z87891 Personal history of nicotine dependence: Secondary | ICD-10-CM

## 2015-07-07 DIAGNOSIS — Z8701 Personal history of pneumonia (recurrent): Secondary | ICD-10-CM

## 2015-07-07 DIAGNOSIS — C787 Secondary malignant neoplasm of liver and intrahepatic bile duct: Secondary | ICD-10-CM | POA: Diagnosis present

## 2015-07-07 DIAGNOSIS — I252 Old myocardial infarction: Secondary | ICD-10-CM

## 2015-07-07 DIAGNOSIS — E109 Type 1 diabetes mellitus without complications: Secondary | ICD-10-CM | POA: Diagnosis present

## 2015-07-07 DIAGNOSIS — Z951 Presence of aortocoronary bypass graft: Secondary | ICD-10-CM | POA: Diagnosis not present

## 2015-07-07 DIAGNOSIS — Z833 Family history of diabetes mellitus: Secondary | ICD-10-CM | POA: Diagnosis not present

## 2015-07-07 DIAGNOSIS — Z794 Long term (current) use of insulin: Secondary | ICD-10-CM | POA: Diagnosis not present

## 2015-07-07 DIAGNOSIS — I25810 Atherosclerosis of coronary artery bypass graft(s) without angina pectoris: Secondary | ICD-10-CM | POA: Diagnosis present

## 2015-07-07 DIAGNOSIS — I110 Hypertensive heart disease with heart failure: Secondary | ICD-10-CM | POA: Diagnosis present

## 2015-07-07 DIAGNOSIS — C801 Malignant (primary) neoplasm, unspecified: Secondary | ICD-10-CM

## 2015-07-07 DIAGNOSIS — R0902 Hypoxemia: Secondary | ICD-10-CM | POA: Diagnosis present

## 2015-07-07 DIAGNOSIS — Z9221 Personal history of antineoplastic chemotherapy: Secondary | ICD-10-CM

## 2015-07-07 DIAGNOSIS — Z9851 Tubal ligation status: Secondary | ICD-10-CM | POA: Diagnosis not present

## 2015-07-07 DIAGNOSIS — I5032 Chronic diastolic (congestive) heart failure: Secondary | ICD-10-CM | POA: Diagnosis present

## 2015-07-07 DIAGNOSIS — M810 Age-related osteoporosis without current pathological fracture: Secondary | ICD-10-CM | POA: Diagnosis present

## 2015-07-07 DIAGNOSIS — Z961 Presence of intraocular lens: Secondary | ICD-10-CM | POA: Diagnosis present

## 2015-07-07 DIAGNOSIS — Z8543 Personal history of malignant neoplasm of ovary: Secondary | ICD-10-CM

## 2015-07-07 DIAGNOSIS — C786 Secondary malignant neoplasm of retroperitoneum and peritoneum: Secondary | ICD-10-CM

## 2015-07-07 DIAGNOSIS — Z9889 Other specified postprocedural states: Secondary | ICD-10-CM

## 2015-07-07 DIAGNOSIS — J90 Pleural effusion, not elsewhere classified: Secondary | ICD-10-CM | POA: Diagnosis present

## 2015-07-07 DIAGNOSIS — Z808 Family history of malignant neoplasm of other organs or systems: Secondary | ICD-10-CM | POA: Diagnosis not present

## 2015-07-07 DIAGNOSIS — K219 Gastro-esophageal reflux disease without esophagitis: Secondary | ICD-10-CM | POA: Diagnosis present

## 2015-07-07 DIAGNOSIS — Z9641 Presence of insulin pump (external) (internal): Secondary | ICD-10-CM | POA: Diagnosis present

## 2015-07-07 DIAGNOSIS — M069 Rheumatoid arthritis, unspecified: Secondary | ICD-10-CM | POA: Diagnosis present

## 2015-07-07 DIAGNOSIS — Z886 Allergy status to analgesic agent status: Secondary | ICD-10-CM | POA: Diagnosis not present

## 2015-07-07 DIAGNOSIS — E785 Hyperlipidemia, unspecified: Secondary | ICD-10-CM | POA: Diagnosis present

## 2015-07-07 DIAGNOSIS — I10 Essential (primary) hypertension: Secondary | ICD-10-CM | POA: Diagnosis present

## 2015-07-07 DIAGNOSIS — G629 Polyneuropathy, unspecified: Secondary | ICD-10-CM

## 2015-07-07 LAB — CBC WITH DIFFERENTIAL/PLATELET
BASOS ABS: 0 10*3/uL (ref 0–0.1)
BASOS PCT: 1 %
EOS ABS: 0 10*3/uL (ref 0–0.7)
Eosinophils Relative: 1 %
HEMATOCRIT: 27.3 % — AB (ref 35.0–47.0)
HEMOGLOBIN: 9.2 g/dL — AB (ref 12.0–16.0)
Lymphocytes Relative: 10 %
Lymphs Abs: 0.6 10*3/uL — ABNORMAL LOW (ref 1.0–3.6)
MCH: 30 pg (ref 26.0–34.0)
MCHC: 33.6 g/dL (ref 32.0–36.0)
MCV: 89.2 fL (ref 80.0–100.0)
Monocytes Absolute: 1.1 10*3/uL — ABNORMAL HIGH (ref 0.2–0.9)
Monocytes Relative: 19 %
NEUTROS ABS: 4 10*3/uL (ref 1.4–6.5)
NEUTROS PCT: 69 %
Platelets: 271 10*3/uL (ref 150–440)
RBC: 3.06 MIL/uL — ABNORMAL LOW (ref 3.80–5.20)
RDW: 16.9 % — AB (ref 11.5–14.5)
WBC: 5.7 10*3/uL (ref 3.6–11.0)

## 2015-07-07 LAB — BASIC METABOLIC PANEL
ANION GAP: 10 (ref 5–15)
BUN: 14 mg/dL (ref 6–20)
CALCIUM: 7.9 mg/dL — AB (ref 8.9–10.3)
CHLORIDE: 97 mmol/L — AB (ref 101–111)
CO2: 28 mmol/L (ref 22–32)
Creatinine, Ser: 0.74 mg/dL (ref 0.44–1.00)
GFR calc non Af Amer: >60 mL/min (ref >60)
Glucose, Bld: 248 mg/dL — ABNORMAL HIGH (ref 65–99)
Potassium: 3.1 mmol/L — ABNORMAL LOW (ref 3.5–5.1)
SODIUM: 135 mmol/L (ref 135–145)

## 2015-07-07 LAB — GLUCOSE, CAPILLARY: Glucose-Capillary: 201 mg/dL — ABNORMAL HIGH (ref 65–99)

## 2015-07-07 MED ORDER — ATORVASTATIN CALCIUM 20 MG PO TABS
80.0000 mg | ORAL_TABLET | Freq: Every day | ORAL | Status: DC
  Administered 2015-07-08: 80 mg via ORAL
  Filled 2015-07-07: qty 4

## 2015-07-07 MED ORDER — GABAPENTIN 300 MG PO CAPS
300.0000 mg | ORAL_CAPSULE | Freq: Three times a day (TID) | ORAL | Status: DC
  Administered 2015-07-08 – 2015-07-09 (×5): 300 mg via ORAL
  Filled 2015-07-07 (×5): qty 1

## 2015-07-07 MED ORDER — FAMOTIDINE 20 MG PO TABS
20.0000 mg | ORAL_TABLET | Freq: Two times a day (BID) | ORAL | Status: DC
  Administered 2015-07-08 – 2015-07-09 (×3): 20 mg via ORAL
  Filled 2015-07-07 (×3): qty 1

## 2015-07-07 MED ORDER — METOPROLOL TARTRATE 25 MG PO TABS
25.0000 mg | ORAL_TABLET | Freq: Two times a day (BID) | ORAL | Status: DC
  Administered 2015-07-08 – 2015-07-09 (×3): 25 mg via ORAL
  Filled 2015-07-07 (×3): qty 1

## 2015-07-07 MED ORDER — INSULIN ASPART 100 UNIT/ML ~~LOC~~ SOLN
0.0000 [IU] | Freq: Three times a day (TID) | SUBCUTANEOUS | Status: DC
  Administered 2015-07-08 (×2): 5 [IU] via SUBCUTANEOUS
  Administered 2015-07-08 (×2): 3 [IU] via SUBCUTANEOUS
  Administered 2015-07-09: 5 [IU] via SUBCUTANEOUS
  Administered 2015-07-09: 08:00:00 3 [IU] via SUBCUTANEOUS
  Filled 2015-07-07: qty 3
  Filled 2015-07-07 (×2): qty 5
  Filled 2015-07-07: qty 3
  Filled 2015-07-07: qty 5
  Filled 2015-07-07: qty 3

## 2015-07-07 MED ORDER — ONDANSETRON HCL 4 MG PO TABS: 4.0000 mg | ORAL_TABLET | Freq: Four times a day (QID) | ORAL | Status: DC | PRN

## 2015-07-07 MED ORDER — LOSARTAN POTASSIUM 50 MG PO TABS
100.0000 mg | ORAL_TABLET | Freq: Every day | ORAL | Status: DC
  Administered 2015-07-08 – 2015-07-09 (×2): 100 mg via ORAL
  Filled 2015-07-07 (×2): qty 2

## 2015-07-07 MED ORDER — FUROSEMIDE 40 MG PO TABS
40.0000 mg | ORAL_TABLET | Freq: Two times a day (BID) | ORAL | Status: DC
  Administered 2015-07-08 – 2015-07-09 (×3): 40 mg via ORAL
  Filled 2015-07-07 (×3): qty 1

## 2015-07-07 MED ORDER — ENOXAPARIN SODIUM 40 MG/0.4ML ~~LOC~~ SOLN
40.0000 mg | SUBCUTANEOUS | Status: DC
  Administered 2015-07-08: 40 mg via SUBCUTANEOUS
  Filled 2015-07-07: qty 0.4

## 2015-07-07 MED ORDER — ONDANSETRON HCL 4 MG/2ML IJ SOLN: 4.0000 mg | Freq: Four times a day (QID) | INTRAMUSCULAR | Status: DC | PRN

## 2015-07-07 MED ORDER — GABAPENTIN 300 MG PO CAPS: 300.0000 mg | ORAL_CAPSULE | Freq: Three times a day (TID) | ORAL | Status: DC

## 2015-07-07 MED ORDER — AMLODIPINE BESYLATE 5 MG PO TABS
5.0000 mg | ORAL_TABLET | Freq: Every day | ORAL | Status: DC
  Administered 2015-07-08 – 2015-07-09 (×2): 5 mg via ORAL
  Filled 2015-07-07 (×2): qty 1

## 2015-07-07 MED ORDER — SODIUM CHLORIDE 0.9% FLUSH
3.0000 mL | Freq: Two times a day (BID) | INTRAVENOUS | Status: DC
  Administered 2015-07-07 – 2015-07-09 (×4): 3 mL via INTRAVENOUS

## 2015-07-07 MED ORDER — ACETAMINOPHEN 650 MG RE SUPP: 650.0000 mg | Freq: Four times a day (QID) | RECTAL | Status: DC | PRN

## 2015-07-07 MED ORDER — ACETAMINOPHEN 325 MG PO TABS: 650.0000 mg | ORAL_TABLET | Freq: Four times a day (QID) | ORAL | Status: DC | PRN

## 2015-07-07 MED ORDER — INSULIN GLARGINE 100 UNIT/ML ~~LOC~~ SOLN
10.0000 [IU] | Freq: Every day | SUBCUTANEOUS | Status: DC
  Administered 2015-07-07 – 2015-07-08 (×2): 10 [IU] via SUBCUTANEOUS
  Filled 2015-07-07 (×3): qty 0.1

## 2015-07-07 NOTE — H&P (Signed)
[facility] NAME: [name]    MR#:  [id number]  DATE OF BIRTH:  1945/07/01  DATE OF ADMISSION:  07/07/2015  PRIMARY CARE PHYSICIAN: Rica Mast, MD   REQUESTING/REFERRING PHYSICIAN: Archie Balboa, M.D.  CHIEF COMPLAINT:   Chief Complaint  Patient presents with  . Shortness of Breath    HISTORY OF PRESENT ILLNESS:  [name]  is a 70 y.o. female who presents with dyspnea and decreased O2 sats. Patient has a history of ovarian cancer, and has recently had procedures done including lysis of adhesions of her GI tract. She follows with Dr. Mike Gip as her oncologist. She was admitted here about a week ago with shortness of breath/dyspnea most likely related to a right-sided pleural effusion. During that status effusion was drained, and fluid was sent for studies. Cytology came back negative, showed only inflammation with predominant neutrophils. Patient returns today with increased dyspnea again, and low oxygen saturation levels. Her oxygen corrected quickly on O2 via nasal cannula. Imaging shows again recurrent right-sided pleural effusion. Patient does have a long-standing history of rheumatoid arthritis, and came off of her DMARD's when she started chemotherapy, which she is still actively getting. She denies any other symptoms of infection, and has a normal white count here in the ED. Lab workup otherwise is largely within normal limits. She does have a history of coronary bypass with coronary artery disease, no prior diagnosis history of heart failure. She had a recent echocardiogram prior to her surgery which showed only mild diastolic dysfunction with preserved ejection fraction and normal left-sided pressures. During her last hospital stay there had been some discussion with CT surgery about possible Pleurx versus a more extensive procedure for her pleural effusion. Hospitals were called for admission.  PAST MEDICAL  HISTORY:   Past Medical History  Diagnosis Date  . Leukopenia 2012    s/p bone marrow biopsy, Dr. Ma Hillock  . Cholelithiasis   . GERD (gastroesophageal reflux disease)   . Hypertension   . Hyperlipidemia   . Esophageal stricture   . Chronic diastolic CHF (congestive heart failure) ([place])   . CAD (coronary artery disease)     a. 09/2012 Cath: LM nl, LAD 95p, 59m LCX 972mOM2 50, RCA 100.  . Marland Kitchenypokalemia   . Herniated disc   . Pneumonia 2013; 08/2012    "one lung; double" (10/18/2012)  . Exertional shortness of breath   . Type I diabetes mellitus ([facility]    "dx'd in 1957" (10/18/2012)  . History of pancytopenia   . H/O hiatal hernia   . Rheumatoid arthritis(714.0)   . NSTEMI (non-ST elevated myocardial infarction) [place]    "mild" (10/18/2012)  . Collagen vascular disease ([facility]  . Ovarian cancer ([facility]    PAST SURGICAL HISTORY:   Past Surgical History  Procedure Laterality Date  . Esophagogastroduodenoscopy  2012    Dr. IfMinna Merritts. Tubal ligation  1970  . Cataract extraction w/ intraocular lens implant Right 2010  . Cardiac catheterization  10/18/2012    "first one was today" (10/18/2012)  . Esophageal dilation      "3 or 4 times" (10/19/2011)  . Coronary artery bypass graft N/A 10/19/2012    Procedure: CORONARY ARTERY BYPASS GRAFTING (CABG);  Surgeon: StMelrose NakayamaMD;  [facility] Service: Open Heart Surgery;  Laterality: N/A;  . Peripheral vascular catheterization N/A 03/02/2015    Procedure: Porta Cath Insertion;  Surgeon: JaErskine Squibb  Lucky Cowboy, MD;  Location: [facility];  Service: Cardiovascular;  Laterality: N/A;  . Abdominal hysterectomy  06/15/2015    Dx L/S, EXLAP TAH BSO omentectomy RSRx colostomy diaphragm resection stripping  . Cholecystectomy  06/15/2015    combined case with ovarian cancer debulking  . Colon surgery    . Ostomy      SOCIAL HISTORY:   Social History  Substance Use Topics  . Smoking status: Former Smoker -- 1.00 packs/day for  30 years    Types: Cigarettes    Quit date: 06/11/1994  . Smokeless tobacco: Never Used  . Alcohol Use: No    FAMILY HISTORY:   Family History  Problem Relation Age of Onset  . Diabetes Mother   . Arthritis Mother   . Diabetes Father   . Arthritis Father   . Bone cancer Sister     DRUG ALLERGIES:   Allergies  Allergen Reactions  . Codeine Nausea And Vomiting    MEDICATIONS AT HOME:   Prior to Admission medications   Medication Sig Start Date End Date Taking? Authorizing Provider  amLODipine (NORVASC) 5 MG tablet Take 5 mg by mouth daily.   Yes Historical Provider, MD  atorvastatin (LIPITOR) 80 MG tablet Take 80 mg by mouth every evening.   Yes Historical Provider, MD  enoxaparin (LOVENOX) 40 MG/0.4ML injection Inject 40 mg into the skin daily.    Yes Historical Provider, MD  furosemide (LASIX) 40 MG tablet Take 1 tablet (40 mg total) by mouth 2 (two) times daily. 05/15/14  Yes Jackolyn Confer, MD  gabapentin (NEURONTIN) 300 MG capsule Take 1 capsule (300 mg total) by mouth 3 (three) times daily. 07/07/15  Yes Lequita Asal, MD  ibuprofen (ADVIL,MOTRIN) 800 MG tablet Take 800 mg by mouth 3 (three) times daily as needed for mild pain.   Yes Historical Provider, MD  Insulin Human (INSULIN PUMP) SOLN Pt uses Humalog insulin.   Yes Historical Provider, MD  lidocaine-prilocaine (EMLA) cream Apply 1 application topically as needed (prior to accessing port).   Yes Historical Provider, MD  loratadine (CLARITIN) 10 MG tablet Take 10 mg by mouth every evening.    Yes Historical Provider, MD  losartan (COZAAR) 100 MG tablet Take 1 tablet (100 mg total) by mouth daily. 05/15/14  Yes Jackolyn Confer, MD  metoprolol tartrate (LOPRESSOR) 25 MG tablet Take 25 mg by mouth 2 (two) times daily.   Yes Historical Provider, MD  Multiple Vitamin (MULTIVITAMIN WITH MINERALS) TABS tablet Take 1 tablet by mouth daily.   Yes Historical Provider, MD  ondansetron (ZOFRAN) 8 MG tablet Take 1 tablet (8  mg total) by mouth every 8 (eight) hours as needed for nausea or vomiting. 03/06/15  Yes Lequita Asal, MD  potassium chloride SA (K-DUR,KLOR-CON) 20 MEQ tablet Take 20 mEq by mouth 2 (two) times daily.   Yes Historical Provider, MD  ranitidine (ZANTAC) 150 MG tablet Take 1 tablet (150 mg total) by mouth 2 (two) times daily. 11/13/14  Yes Jackolyn Confer, MD  zoledronic acid (RECLAST) 5 MG/100ML SOLN Inject 5 mg into the vein See admin instructions. Pt gets an injection once per year.   Yes Historical Provider, MD    REVIEW OF SYSTEMS:  Review of Systems  Constitutional: Negative for fever, chills, weight loss and malaise/fatigue.  HENT: Negative for ear pain, hearing loss and tinnitus.   Eyes: Negative for blurred vision, double vision, pain and redness.  Respiratory: Positive for shortness of breath. Negative  for cough and hemoptysis.   Cardiovascular: Positive for leg swelling. Negative for chest pain, palpitations and orthopnea.  Gastrointestinal: Negative for nausea, vomiting, abdominal pain, diarrhea and constipation.  Genitourinary: Negative for dysuria, frequency and hematuria.  Musculoskeletal: Negative for back pain, joint pain and neck pain.  Skin:       No acne, rash, or lesions  Neurological: Negative for dizziness, tremors, focal weakness and weakness.  Endo/Heme/Allergies: Negative for polydipsia. Does not bruise/bleed easily.  Psychiatric/Behavioral: Negative for depression. The patient is not nervous/anxious and does not have insomnia.      VITAL SIGNS:   Filed Vitals:   07/07/15 1912 07/07/15 1930 07/07/15 2000 07/07/15 2030  BP:  122/46 130/68 125/49  Pulse: 99 89 96 87  Temp:      TempSrc:      Resp: 18     Height:      Weight:      SpO2: 97% 98% 94% 96%   Wt Readings from Last 3 Encounters:  07/07/15 68.04 kg (150 lb)  07/01/15 69 kg (152 lb 1.9 oz)  07/01/15 69.5 kg (153 lb 3.5 oz)    PHYSICAL EXAMINATION:  Physical Exam  Vitals  reviewed. Constitutional: She is oriented to person, place, and time. She appears well-developed and well-nourished. No distress.  HENT:  Head: Normocephalic and atraumatic.  Mouth/Throat: Oropharynx is clear and moist.  Eyes: Conjunctivae and EOM are normal. Pupils are equal, round, and reactive to light. No scleral icterus.  Neck: Normal range of motion. Neck supple. No JVD present. No thyromegaly present.  Cardiovascular: Normal rate, regular rhythm and intact distal pulses.  Exam reveals no gallop and no friction rub.   No murmur heard. With some PVCs  Respiratory: Effort normal. No respiratory distress. She has no wheezes. She has no rales.  Lungs clear to auscultation bilaterally except for diminished to absent breath sounds in her right base.  GI: Soft. Bowel sounds are normal. She exhibits no distension. There is tenderness.  Colostomy in place, surgical incision well healing, abdomen is mildly tender to palpation post procedure, with no acute change in this exam finding per patient's report.  Musculoskeletal: Normal range of motion. She exhibits edema (1+ bilateral lower extremity).  No arthritis, no gout  Lymphadenopathy:    She has no cervical adenopathy.  Neurological: She is alert and oriented to person, place, and time. No cranial nerve deficit.  No dysarthria, no aphasia  Skin: Skin is warm and dry. No rash noted. No erythema.  Psychiatric: She has a normal mood and affect. Her behavior is normal. Judgment and thought content normal.    LABORATORY PANEL:   CBC  Recent Labs Lab 07/07/15 1958  WBC 5.7  HGB 9.2*  HCT 27.3*  PLT 271   ------------------------------------------------------------------------------------------------------------------  Chemistries   Recent Labs Lab 07/07/15 1958  NA 135  K 3.1*  CL 97*  CO2 28  GLUCOSE 248*  BUN 14  CREATININE 0.74  CALCIUM 7.9*    ------------------------------------------------------------------------------------------------------------------  Cardiac Enzymes  Recent Labs Lab 07/01/15 1445  TROPONINI 0.04*   ------------------------------------------------------------------------------------------------------------------  RADIOLOGY:  Dg Chest 2 View  07/07/2015  CLINICAL DATA:  Hypoxia. EXAM: CHEST  2 VIEW COMPARISON:  None. FINDINGS: Port-A-Cath noted with tip in stable position. Prior CABG. Cardiomegaly with normal pulmonary vascularity. Right lower lobe infiltrate and/or atelectasis. Interim increase in right pleural effusion. Small amount of air noted over the right lung base, this most likely represent tiny loculated air collection from prior thoracentesis. No prominent  pneumothorax. Continued follow-up chest x-rays suggested. IMPRESSION: 1. Recurring right pleural effusion. Right pleural effusion is moderate-sized. Noted is a small air collection noted in the right lower pleural space, this is most likely from recent thoracentesis. No prominent pneumothorax noted. Follow-up PA lateral chest x-ray suggested to demonstrate resolution. 2.  Progressive right base atelectasis and or infiltrate. 3. Prior CABG.  Cardiomegaly.  No pulmonary venous congestion. 4. Port-A-Cath in stable position . Critical Value/emergent results were called by telephone at the time of interpretation on 07/07/2015 at 2:59 pm to Dr. Nolon Stalls , who verbally acknowledged these results. Electronically Signed   By: Marcello Moores  Register   On: 07/07/2015 15:03    EKG:   Orders placed or performed during the hospital encounter of 07/07/15  . ED EKG  . ED EKG    IMPRESSION AND PLAN:  Principal Problem:   Recurrent pleural effusion on right - fluid was analyzed during her last hospital stay and was found to be inflammatory with predominance of neutrophils, no cancer cells noted on pathology report. Back in 04/2015 patient had a CT of abdomen  and performed which showed at that time a small left pleural effusion with trace right pleural effusion. This time imaging shows right-sided effusion, which is recurrent after having it tapped about a week ago. Given recent pathology report, have a low suspicion for malignant effusion here. Suspect this is more likely due to her rheumatoid arthritis, versus less likely possibly from some aspect of right-sided heart failure. We will get a rheumatology consult for further input, and consider possible re-tap versus having CT surgery look at her again for possible procedure or definitive treatment. We will refer rheumatology's input prior to ordering the above. Active Problems:   Ovarian cancer [facility]) - patient is currently rotating through cycles of chemotherapy, with 2 cycles remaining per her report. We can get an oncology consult if rheumatology feels their input is needed during this hospital stay.   Hypertension - currently at goal, continue home meds   RA (rheumatoid arthritis) ([place]) - patient came off of her DMARD's when she started chemotherapy. Rheumatology consult as above.   Diabetes type 1, controlled ([place]) - patient uses insulin pump to control this. Recently her blood sugars have been higher as her pump settings were reduced prior to going in for her surgery. Per hospital protocol we will DC her pump while here. We will have her on 10 units of Lantus for basal effect to start plus sliding scale insulin at moderate dosing before meals at bedtime. Heart healthy/carb modified diet. Goal is for blood glucose value less than 180 she may well need up titration of her medications based on her fingerstick values.   CAD (coronary artery disease) of artery bypass graft - continue home meds   Hyperlipidemia - continue home meds   GERD (gastroesophageal reflux disease) - home dose H2 blocker  All the records are reviewed and case discussed with ED provider. Management plans discussed with the patient and/or  family.  DVT PROPHYLAXIS: SubQ lovenox  GI PROPHYLAXIS: H2 blocker  ADMISSION STATUS: Inpatient  CODE STATUS: Full Code Status History    Date Active Date Inactive Code Status Order ID Comments User Context   07/01/2015  8:06 PM 07/03/2015  4:36 PM Full Code [id number]  Fritzi Mandes, MD Inpatient   03/02/2015 10:28 AM 03/03/2015 10:50 AM Full Code [id number]  Algernon Huxley, MD Inpatient   10/19/2012  3:03 PM 10/21/2012 12:23 PM Full Code [id number]  Donielle  Liston Alba, PA-C Inpatient    Advance Directive Documentation        Most Recent Value   Type of Advance Directive  Healthcare Power of Attorney   Pre-existing out of facility DNR order (yellow form or pink MOST form)     "MOST" Form in Place?        TOTAL TIME TAKING CARE OF THIS PATIENT: 45 minutes.    Skylan Gift Redfield 07/07/2015, 9:25 PM  [employer] [facility]  Office  [phone]  CC: Primary care physician; Rica Mast, MD

## 2015-07-07 NOTE — Telephone Encounter (Signed)
  Oncology Nurse Navigator Documentation  Navigator Location: [facility] (07/07/15 0800)                                            Time Spent with Patient: 15 (07/07/15 0800)   Received call from Ms Bondurant. She needs script for gabapentin called into [facility]. She would also like to increase dose from 2x/day to 3x/day. States her hands are bothering her more.

## 2015-07-07 NOTE — ED Notes (Signed)
Pt states she received chest xray this morning.

## 2015-07-07 NOTE — Progress Notes (Signed)
Pt personal insulin pump removed by patient and sent home w/ husband [name]. Pt educated that during hospital stay blood sugars will be checked at meals & bedtime w/ appropriate coverage according to her blood sugar.

## 2015-07-07 NOTE — Telephone Encounter (Signed)
  Oncology Nurse Navigator Documentation  Navigator Location: [facility] (07/07/15 1300)                                            Time Spent with Patient: 15 (07/07/15 1300)   Notified that gabapentin has been called in. She states she went for CXR. Results are pending.

## 2015-07-07 NOTE — ED Notes (Signed)
Pt states she had major surgery for CA 3 weeks ago.. States she had to be admitted last week for fluid on the lung and feels like the fluid has built back up, having SOB,.. Pt is in NAD on arrival..

## 2015-07-07 NOTE — Telephone Encounter (Signed)
Transition Care Management Follow-up Telephone Call  How have you been since you were released from the hospital? Haven't been doing good, fluid has built up and having it removed tomorrow 3/8 at [facility].   Running low temp per her standards 99.1, nauseated, no food sounds good.    Do you understand why you were in the hospital? Yes, because of fluid and low oxygen levels   Do you understand the discharge instrcutions? Yes, no questions at this time  Items Reviewed:  Medications reviewed: yes, no changes made  Allergies reviewed: yes, no changes  Dietary changes reviewed: yes, no changes  Referrals reviewed: no referrals made   Functional Questionnaire:   Activities of Daily Living (ADLs):   She states they are independent in the following: husband is assisting her with ADL's since surgery States they require assistance with the following: walker and cane   Any transportation issues/concerns?: no, husband will bring her to the appt.    Any patient concerns? no    Confirmed importance and date/time of follow-up visits scheduled: 07/16/2016 with Dr. Gilford Rile  Following up with Cancer center Friday this week and the Rheumatoid doctor this am, endocrine at the end of the month.     Confirmed with patient if condition begins to worsen call PCP or go to the ER.  Patient was given the Call-a-Nurse line [phone]: yes

## 2015-07-07 NOTE — Telephone Encounter (Signed)
  Oncology Nurse Navigator Documentation  Navigator Location: [facility] (07/07/15 1500)                                            Time Spent with Patient: 15 (07/07/15 1500)   Spoke with [name] on the phone. Gave her CXR results and per Dr Mike Gip she needs to have the fluid removed. Reports her SOB is stable and she is in no acute distress. She would like to have it done tomorrow. Notified Josh

## 2015-07-07 NOTE — ED Provider Notes (Signed)
[facility] Emergency Department Provider Note   ____________________________________________  Time seen: ~1905  I have reviewed the triage vital signs and the nursing notes.   HISTORY  Chief Complaint Shortness of Breath   History limited by: Not Limited   HPI [name] is a 70 y.o. female who presents to the emergency department today because of concerns for shortness of breath and increased pleural fluid. The patient recently was admitted to the hospital and had a thoracentesis performed on the right pleural effusion. Since that time after an initial period of relief she has had increasing shortness of breath. She does not have any chest pain. She denies any associated fevers. She did have a chest x-ray done today ordered by her oncologist. It did show reaccumulation of the fluid. At her house her oxygen saturation was dropping into the low 80s with ambulation. She was instructed to come to the emergency department for admission and repeat thoracentesis.    Past Medical History  Diagnosis Date  . Leukopenia 2012    s/p bone marrow biopsy, Dr. Ma Hillock  . Cholelithiasis   . GERD (gastroesophageal reflux disease)   . Hypertension   . Hyperlipidemia   . Esophageal stricture   . Chronic diastolic CHF (congestive heart failure) ([place])   . CAD (coronary artery disease)     a. 09/2012 Cath: LM nl, LAD 95p, 27m LCX 956mOM2 50, RCA 100.  . Marland Kitchenypokalemia   . Herniated disc   . Pneumonia 2013; 08/2012    "one lung; double" (10/18/2012)  . Exertional shortness of breath   . Type I diabetes mellitus (HCSan German    "dx'd in 1957" (10/18/2012)  . History of pancytopenia   . H/O hiatal hernia   . Rheumatoid arthritis(714.0)   . NSTEMI (non-ST elevated myocardial infarction) (HCNew Berlin5/2014    "mild" (10/18/2012)  . Collagen vascular disease (HCPettibone  . [facility]varian cancer ([facility]    Patient Active Problem List   Diagnosis Date Noted  . Pleural effusion 07/01/2015  .  Decubitus ulcer 05/19/2015  . Hyponatremia 05/15/2015  . Neuropathy (HCLuz[place]01/13/2017  . Ovarian cancer (HCAlondra Park01/08/2015  . Bilateral lower extremity edema 03/30/2015  . Hypomagnesemia 03/05/2015  . Diarrhea 02/26/2015  . Peritoneal carcinomatosis (HCTulsa10/18/2016  . Anemia 02/17/2015  . Adnexal mass 02/13/2015  . Angina pectoris associated with type 2 diabetes mellitus (HCHi-Nella10/03/2015  . History of esophageal stricture 07/08/2014  . Dysphagia, pharyngoesophageal phase 07/08/2014  . Candida esophagitis (HCGilliam01/14/2016  . Cataracts, bilateral 01/13/2014  . Medicare annual wellness visit, subsequent 09/20/2013  . CAD (coronary artery disease) of artery bypass graft 11/05/2012  . Anemia, iron deficiency 10/09/2012  . CAD (coronary artery disease) 10/01/2012  . Hearing loss 07/06/2011  . Diabetes type 1, controlled (HCGreen Meadows03/10/2011  . Gastroesophageal reflux disease with stricture 07/06/2011  . GERD (gastroesophageal reflux disease) 04/05/2011  . Osteoporosis, unspecified 04/05/2011  . Hypertension 01/10/2011  . Hyperlipidemia 01/10/2011  . RA (rheumatoid arthritis) (HCSuperior09/01/2011    Past Surgical History  Procedure Laterality Date  . Esophagogastroduodenoscopy  2012    Dr. IfMinna Merritts. Tubal ligation  1970  . Cataract extraction w/ intraocular lens implant Right 2010  . Cardiac catheterization  10/18/2012    "first one was today" (10/18/2012)  . Esophageal dilation      "3 or 4 times" (10/19/2011)  . Coronary artery bypass graft N/A 10/19/2012    Procedure: CORONARY ARTERY BYPASS GRAFTING (CABG);  Surgeon: StRemo Lipps  Chaya Jan, MD;  Location: [place];  Service: Open Heart Surgery;  Laterality: N/A;  . Peripheral vascular catheterization N/A 03/02/2015    Procedure: Porta Cath Insertion;  Surgeon: Algernon Huxley, MD;  Location: [place] CV [facility];  Service: Cardiovascular;  Laterality: N/A;  . Abdominal hysterectomy  06/15/2015    Dx L/S, EXLAP TAH BSO omentectomy RSRx colostomy  diaphragm resection stripping  . Cholecystectomy  06/15/2015    combined case with ovarian cancer debulking  . Colon surgery    . Ostomy      Current Outpatient Rx  Name  Route  Sig  Dispense  Refill  . amLODipine (NORVASC) 5 MG tablet   Oral   Take 5 mg by mouth daily.         Marland Kitchen atorvastatin (LIPITOR) 80 MG tablet   Oral   Take 80 mg by mouth every evening.         . Calcium Carbonate-Vitamin D (CALCIUM 600+D) 600-400 MG-UNIT tablet   Oral   Take 1 tablet by mouth 2 (two) times daily.         . cholecalciferol (VITAMIN D) 1000 units tablet   Oral   Take 2,000 Units by mouth daily.         Marland Kitchen enoxaparin (LOVENOX) 40 MG/0.4ML injection   Subcutaneous   Inject 40 mg into the skin daily.          . furosemide (LASIX) 40 MG tablet   Oral   Take 1 tablet (40 mg total) by mouth 2 (two) times daily.   180 tablet   3   . gabapentin (NEURONTIN) 300 MG capsule   Oral   Take 1 capsule (300 mg total) by mouth 3 (three) times daily.   90 capsule   1   . ibuprofen (ADVIL,MOTRIN) 800 MG tablet   Oral   Take 800 mg by mouth 3 (three) times daily as needed for mild pain.         . Insulin Human (INSULIN PUMP) SOLN      Pt uses Humalog insulin.         Marland Kitchen lidocaine-prilocaine (EMLA) cream   Topical   Apply 1 application topically as needed (prior to accessing port).         . loratadine (CLARITIN) 10 MG tablet   Oral   Take 10 mg by mouth daily.         Marland Kitchen losartan (COZAAR) 100 MG tablet   Oral   Take 1 tablet (100 mg total) by mouth daily.   90 tablet   3   . metoprolol tartrate (LOPRESSOR) 25 MG tablet   Oral   Take 25 mg by mouth 2 (two) times daily.         . Multiple Vitamin (MULTIVITAMIN WITH MINERALS) TABS tablet   Oral   Take 1 tablet by mouth daily.         . ondansetron (ZOFRAN) 8 MG tablet   Oral   Take 1 tablet (8 mg total) by mouth every 8 (eight) hours as needed for nausea or vomiting.   20 tablet   1   . pantoprazole  (PROTONIX) 40 MG tablet   Oral   Take 40 mg by mouth every evening.         . potassium chloride SA (K-DUR,KLOR-CON) 20 MEQ tablet   Oral   Take 20 mEq by mouth 2 (two) times daily.         . ranitidine (ZANTAC)  150 MG tablet   Oral   Take 1 tablet (150 mg total) by mouth 2 (two) times daily.   180 tablet   2   . zoledronic acid (RECLAST) 5 MG/100ML SOLN   Intravenous   Inject 5 mg into the vein See admin instructions. Pt gets an injection once per year.           Allergies Codeine  Family History  Problem Relation Age of Onset  . Diabetes Mother   . Arthritis Mother   . Diabetes Father   . Arthritis Father   . Bone cancer Sister     Social History Social History  Substance Use Topics  . Smoking status: Former Smoker -- 1.00 packs/day for 30 years    Types: Cigarettes    Quit date: 06/11/1994  . Smokeless tobacco: Never Used  . Alcohol Use: No    Review of Systems  Constitutional: Negative for fever. Cardiovascular: Negative for chest pain. Respiratory: Positive for shortness of breath. Gastrointestinal: Negative for abdominal pain, vomiting and diarrhea. Neurological: Negative for headaches, focal weakness or numbness.  10-point ROS otherwise negative.  ____________________________________________   PHYSICAL EXAM:  VITAL SIGNS: ED Triage Vitals  Enc Vitals Group     BP 07/07/15 1822 107/48 mmHg     Pulse Rate 07/07/15 1822 94     Resp 07/07/15 1822 20     Temp 07/07/15 1822 97.4 F (36.3 C)     Temp Source 07/07/15 1822 Oral     SpO2 07/07/15 1822 94 %     Weight 07/07/15 1822 150 lb (68.04 kg)     Height 07/07/15 1822 5' 3"  (1.6 m)     Head Cir --      Peak Flow --      Pain Score 07/07/15 1822 0   Constitutional: Alert and oriented. Well appearing and in no distress. Eyes: Conjunctivae are normal. PERRL. Normal extraocular movements. ENT   Head: Normocephalic and atraumatic.   Nose: No congestion/rhinnorhea.    Mouth/Throat: Mucous membranes are moist.   Neck: No stridor. Hematological/Lymphatic/Immunilogical: No cervical lymphadenopathy. Cardiovascular: Normal rate, regular rhythm.  No murmurs, rubs, or gallops. Respiratory: Normal respiratory effort without tachypnea nor retractions. Absent breath sounds in the right lower lung. Gastrointestinal: Soft and nontender. No distention.  Genitourinary: Deferred Musculoskeletal: Normal range of motion in all extremities. No joint effusions. 1+ bilateral pitting edema Neurologic:  Normal speech and language. No gross focal neurologic deficits are appreciated.  Skin:  Skin is warm, dry and intact. No rash noted. Psychiatric: Mood and affect are normal. Speech and behavior are normal. Patient exhibits appropriate insight and judgment.  ____________________________________________    LABS (pertinent positives/negatives)  Labs Reviewed  CBC WITH DIFFERENTIAL/PLATELET - Abnormal; Notable for the following:    RBC 3.06 (*)    Hemoglobin 9.2 (*)    HCT 27.3 (*)    RDW 16.9 (*)    Lymphs Abs 0.6 (*)    Monocytes Absolute 1.1 (*)    All other components within normal limits  BASIC METABOLIC PANEL - Abnormal; Notable for the following:    Potassium 3.1 (*)    Chloride 97 (*)    Glucose, Bld 248 (*)    Calcium 7.9 (*)    All other components within normal limits  GLUCOSE, CAPILLARY - Abnormal; Notable for the following:    Glucose-Capillary 201 (*)    All other components within normal limits  CBC  CREATININE, SERUM  BASIC METABOLIC PANEL  CBC  ____________________________________________   EKG  None  ____________________________________________    RADIOLOGY  CXR IMPRESSION: 1. Recurring right pleural effusion. Right pleural effusion is moderate-sized. Noted is a small air collection noted in the right lower pleural space, this is most likely from recent thoracentesis. No prominent pneumothorax noted. Follow-up PA lateral  chest x-ray suggested to demonstrate resolution.  2. Progressive right base atelectasis and or infiltrate.  3. Prior CABG. Cardiomegaly. No pulmonary venous congestion.  4. Port-A-Cath in stable position .   ____________________________________________   PROCEDURES  Procedure(s) performed: None  Critical Care performed: No  ____________________________________________   INITIAL IMPRESSION / ASSESSMENT AND PLAN / ED COURSE  Pertinent labs & imaging results that were available during my care of the patient were reviewed by me and considered in my medical decision making (see chart for details).  Patient presented to the emergency department today with shortness of breath and reaccumulation of right-sided pleural effusion. Will plan on admission to the hospital service for further workup and management.  ____________________________________________   FINAL CLINICAL IMPRESSION(S) / ED DIAGNOSES  Final diagnoses:  Pleural effusion  Hypoxia     Nance Pear, MD 07/07/15 2332

## 2015-07-07 NOTE — Telephone Encounter (Signed)
Re:  CXR and hypoxia  Discussed with patient CXR and recurrence of pleural fluid with small loculated air.  Patient seen by home health and noted O2 sats 88% at rest and 80% with ambulation.  Patient short of breath on phone.  Discussed plan to return to hospital for likely admission, thoracentesis, and oxygen.  Discussed with ER physicians.   Lequita Asal, MD

## 2015-07-08 ENCOUNTER — Inpatient Hospital Stay
Admit: 2015-07-08 | Discharge: 2015-07-08 | Disposition: A | Discharge status: Home / Self Care | Payer: Medicare Other | Attending: Internal Medicine | Admitting: Internal Medicine

## 2015-07-08 ENCOUNTER — Inpatient Hospital Stay: Payer: Medicare Other

## 2015-07-08 ENCOUNTER — Inpatient Hospital Stay: Admit: 2015-07-08 | Payer: Medicare Other

## 2015-07-08 LAB — BODY FLUID CULTURE
Culture: NO GROWTH
Special Requests: NORMAL

## 2015-07-08 LAB — MAGNESIUM
MAGNESIUM: 2 mg/dL (ref 1.7–2.4)
Magnesium: 1.2 mg/dL — ABNORMAL LOW (ref 1.7–2.4)

## 2015-07-08 LAB — BODY FLUID CELL COUNT WITH DIFFERENTIAL
EOS FL: 1 %
Lymphs, Fluid: 21 %
Monocyte-Macrophage-Serous Fluid: 12 %
Neutrophil Count, Fluid: 66 %
OTHER CELLS FL: 0 %
Total Nucleated Cell Count, Fluid: 1394 cu mm

## 2015-07-08 LAB — BASIC METABOLIC PANEL
Anion gap: 12 (ref 5–15)
BUN: 10 mg/dL (ref 6–20)
CHLORIDE: 99 mmol/L — AB (ref 101–111)
CO2: 26 mmol/L (ref 22–32)
CREATININE: 0.62 mg/dL (ref 0.44–1.00)
Calcium: 7.5 mg/dL — ABNORMAL LOW (ref 8.9–10.3)
GFR calc Af Amer: >60 mL/min (ref >60)
GFR calc non Af Amer: >60 mL/min (ref >60)
Glucose, Bld: 210 mg/dL — ABNORMAL HIGH (ref 65–99)
Potassium: 2.8 mmol/L — CL (ref 3.5–5.1)
Sodium: 137 mmol/L (ref 135–145)

## 2015-07-08 LAB — CBC
HEMATOCRIT: 27.9 % — AB (ref 35.0–47.0)
Hemoglobin: 9.5 g/dL — ABNORMAL LOW (ref 12.0–16.0)
MCH: 30.6 pg (ref 26.0–34.0)
MCHC: 34.1 g/dL (ref 32.0–36.0)
MCV: 89.6 fL (ref 80.0–100.0)
PLATELETS: 254 10*3/uL (ref 150–440)
RBC: 3.12 MIL/uL — AB (ref 3.80–5.20)
RDW: 17.1 % — ABNORMAL HIGH (ref 11.5–14.5)
WBC: 4.1 10*3/uL (ref 3.6–11.0)

## 2015-07-08 LAB — GLUCOSE, CAPILLARY
GLUCOSE-CAPILLARY: 185 mg/dL — AB (ref 65–99)
GLUCOSE-CAPILLARY: 214 mg/dL — AB (ref 65–99)
GLUCOSE-CAPILLARY: 226 mg/dL — AB (ref 65–99)
Glucose-Capillary: 200 mg/dL — ABNORMAL HIGH (ref 65–99)

## 2015-07-08 LAB — POTASSIUM: POTASSIUM: 3.6 mmol/L (ref 3.5–5.1)

## 2015-07-08 LAB — PROTEIN, BODY FLUID: TOTAL PROTEIN, FLUID: 3.3 g/dL

## 2015-07-08 MED ORDER — POTASSIUM CHLORIDE 10 MEQ/100ML IV SOLN
10.0000 meq | INTRAVENOUS | Status: AC
  Administered 2015-07-08 (×2): 10 meq via INTRAVENOUS
  Filled 2015-07-08 (×2): qty 100

## 2015-07-08 MED ORDER — MAGNESIUM SULFATE 4 GM/100ML IV SOLN
4.0000 g | Freq: Once | INTRAVENOUS | Status: AC
  Administered 2015-07-08: 12:00:00 4 g via INTRAVENOUS
  Filled 2015-07-08: qty 100

## 2015-07-08 MED ORDER — POTASSIUM CHLORIDE CRYS ER 20 MEQ PO TBCR
20.0000 meq | EXTENDED_RELEASE_TABLET | Freq: Once | ORAL | Status: AC
  Administered 2015-07-08: 09:00:00 20 meq via ORAL
  Filled 2015-07-08: qty 1

## 2015-07-08 MED ORDER — POTASSIUM CHLORIDE 10 MEQ/100ML IV SOLN
10.0000 meq | INTRAVENOUS | Status: AC
  Administered 2015-07-08 (×2): 10 meq via INTRAVENOUS
  Filled 2015-07-08 (×4): qty 100

## 2015-07-08 NOTE — Progress Notes (Signed)
Pt received from [place] S/P RIGHT thoracentesis, no complications noted, no c/o pain, will continue to asess

## 2015-07-08 NOTE — Care Management Important Message (Signed)
Important Message  Patient Details  Name: [name] MRN: [id number] Date of Birth: [birth year]   Medicare Important Message Given:  Yes    Shelbie Ammons, RN 07/08/2015, 8:20 AM

## 2015-07-08 NOTE — Progress Notes (Addendum)
Patient ID: [name], female   DOB: 1946-03-26, 70 y.o.   MRN: [id number] [place] [facility] [place] NAME: [name]    MR#:  [id number]  DATE OF BIRTH:  [birth year]  SUBJECTIVE:   Came in with increasing shortness of breath. 2 be hypoxic REVIEW OF SYSTEMS:   Review of Systems  Constitutional: Negative for fever, chills and weight loss.  HENT: Negative for ear discharge, ear pain and nosebleeds.   Eyes: Negative for blurred vision, pain and discharge.  Respiratory: Positive for shortness of breath. Negative for sputum production, wheezing and stridor.   Cardiovascular: Negative for chest pain, palpitations, orthopnea and PND.  Gastrointestinal: Negative for nausea, vomiting, abdominal pain and diarrhea.  Genitourinary: Negative for urgency and frequency.  Musculoskeletal: Negative for back pain and joint pain.  Neurological: Positive for weakness. Negative for sensory change, speech change and focal weakness.  Psychiatric/Behavioral: Negative for depression and hallucinations. The patient is not nervous/anxious.   All other systems reviewed and are negative.  Tolerating Diet: Yes Tolerating PT: Pending  DRUG ALLERGIES:   Allergies  Allergen Reactions  . Codeine Nausea And Vomiting    VITALS:  Blood pressure 142/45, pulse 101, temperature 98.6 F (37 C), temperature source Oral, resp. rate 18, height 5\' 3"  (1.6 m), weight 67.359 kg (148 lb 8 oz), SpO2 94 %.  PHYSICAL EXAMINATION:   Physical Exam  GENERAL:  70 y.o.-year-old patient lying in the bed with no acute distress.  EYES: Pupils equal, round, reactive to light and accommodation. No scleral icterus. Extraocular muscles intact.  HEENT: Head atraumatic, normocephalic. Oropharynx and nasopharynx clear.  NECK:  Supple, no jugular venous distention. No thyroid enlargement, no tenderness.  LUNGS: Decreased breath sounds bilaterally, no wheezing, rales, rhonchi. No use of  accessory muscles of respiration.  CARDIOVASCULAR: S1, S2 normal. No murmurs, rubs, or gallops.  ABDOMEN: Soft, nontender, nondistended. Bowel sounds present. No organomegaly or mass.  EXTREMITIES: No cyanosis, clubbing or edema b/l.    NEUROLOGIC: Cranial nerves II through XII are intact. No focal Motor or sensory deficits b/l.   PSYCHIATRIC:  patient is alert and oriented x 3.  SKIN: No obvious rash, lesion, or ulcer.   LABORATORY PANEL:  CBC  Recent Labs Lab 07/08/15 0530  WBC 4.1  HGB 9.5*  HCT 27.9*  PLT 254    Chemistries   Recent Labs Lab 07/08/15 0530  NA 137  K 2.8*  CL 99*  CO2 26  GLUCOSE 210*  BUN 10  CREATININE 0.62  CALCIUM 7.5*  MG 1.2*   Cardiac Enzymes  Recent Labs Lab 07/01/15 1445  TROPONINI 0.04*   RADIOLOGY:  Dg Chest 2 View  07/07/2015  CLINICAL DATA:  Hypoxia. EXAM: CHEST  2 VIEW COMPARISON:  None. FINDINGS: Port-A-Cath noted with tip in stable position. Prior CABG. Cardiomegaly with normal pulmonary vascularity. Right lower lobe infiltrate and/or atelectasis. Interim increase in right pleural effusion. Small amount of air noted over the right lung base, this most likely represent tiny loculated air collection from prior thoracentesis. No prominent pneumothorax. Continued follow-up chest x-rays suggested. IMPRESSION: 1. Recurring right pleural effusion. Right pleural effusion is moderate-sized. Noted is a small air collection noted in the right lower pleural space, this is most likely from recent thoracentesis. No prominent pneumothorax noted. Follow-up PA lateral chest x-ray suggested to demonstrate resolution. 2.  Progressive right base atelectasis and or infiltrate. 3. Prior CABG.  Cardiomegaly.  No pulmonary venous congestion. 4.  Port-A-Cath in stable position . Critical Value/emergent results were called by telephone at the time of interpretation on 07/07/2015 at 2:59 pm to Dr. Nolon Stalls , who verbally acknowledged these results.  Electronically Signed   By: Marcello Moores  Register   On: 07/07/2015 15:03   ASSESSMENT AND PLAN:  [name] is a 70 y.o. female who presents with dyspnea and decreased O2 sats. Patient has a history of ovarian cancer, and has recently had procedures done including lysis of adhesions of her GI tract. She follows with Dr. Mike [name] as her oncologist. She was admitted here about a week ago with shortness of breath/dyspnea most likely related to a right-sided pleural effusion.  1.Recurrent pleural effusion on right  -p redominance of neutrophils, no cancer cells noted on pathology report.  -This is patient's third thoracentesis and last 3-4 weeks. Etiology unclear. -Suspect this is more likely due to her rheumatoid arthritis, versus less likely possibly from some aspect of right-sided heart failure. - We will get a rheumatology consult for further input, and consider possible re-tap versus having CT surgery look at her again for possible procedure or definitive treatment. - echo of the heart.  - will repeat thoracentesis and resend fluid for cytology and RA factor.   2.  Ovarian  cancer ([place]) - patient is currently rotating through cycles of chemotherapy, with 2 cycles remaining per her report.    3. Hypertension - currently at goal, continue home meds  4.  RA (rheumatoid arthritis) ([place]) - patient came off of her DMARD's when she started chemotherapy. Rheumatology consult as above.  5.  Diabetes type 1, controlled ([place]) - patient uses insulin pump to control this. Recently her blood sugars have been higher as her pump settings were reduced prior to going in for her surgery. Per hospital protocol we will DC her pump while here. We will have her on 10 units of Lantus for basal effect to start plus sliding scale insulin at moderate dosing before meals at bedtime.  -Heart healthy/carb modified diet.    6. CAD (coronary artery disease) of artery bypass graft - continue home meds  7. Hyperlipidemia  - continue home meds  8.  GERD (gastroesophageal reflux disease) - home dose H2 blocker Case discussed with Care Management/Social Worker. Management plans discussed with the patient, family and they are in agreement.  CODE STATUS: full   DVT ProphylaxisLOVENOX TOTAL TIME TAKING CARE OF THIS PATIENT:35inutes.  >50% time spent on counselling and coordination of care patient, husband, Dr. Mike [name] and Dr.kernodle  POSSIBLE D/C IN 2-3 DAYS, DEPENDING ON CLINICAL CONDITION.  Note: This dictation was prepared with Dragon dictation along with smaller phrase technology. Any transcriptional errors that result from this process are unintentional.  Travonne Schowalter M.D on 07/08/2015 at 12:14 PM  Between 7am to 6pm - Pager - [phone]  After 6pm go to [email or web address] - password [email or web address]  Office  [phone]  CC: Primary care physician; Rica Mast, MD

## 2015-07-08 NOTE — Consult Note (Signed)
Reason for Consult:RA  Referring Physician: Hospitalist  CYNTHA SUNDERMAN   HPI: 70 year old white female. Well-known to me for rheumatoid arthritis. See my note from yesterday's clinic visit. In care everywhere. Copy in paper chart  PMH: Rheumatoid arthritis. Metastatic ovarian cancer. Osteoporosis. Diabetes.  SURGICAL HISTORY: Recent debulking procedure for metastatic ovarian cancer on  Family History: No history of rheumatoid  Social History: No cigarettes or alcohol  Allergies:  Allergies  Allergen Reactions  . Codeine Nausea And Vomiting    Medications:  Scheduled: . amLODipine  5 mg Oral Daily  . atorvastatin  80 mg Oral q1800  . enoxaparin (LOVENOX) injection  40 mg Subcutaneous Q24H  . famotidine  20 mg Oral BID  . furosemide  40 mg Oral BID  . gabapentin  300 mg Oral TID  . insulin aspart  0-15 Units Subcutaneous TID AC & HS  . insulin glargine  10 Units Subcutaneous QHS  . losartan  100 mg Oral Daily  . magnesium sulfate 1 - 4 g bolus IVPB  4 g Intravenous Once  . metoprolol tartrate  25 mg Oral Q12H  . sodium chloride flush  3 mL Intravenous Q12H        ROS: Joints have not been recently flared.   PHYSICAL EXAM: Blood pressure 142/45, pulse 101, temperature 98.6 F (37 C), temperature source Oral, resp. rate 18, height 5\' 3"  (1.6 m), weight 67.359 kg (148 lb 8 oz), SpO2 94 %. Pleasant female. Pale appearing no acute distress. Hands without significant synovitis. Decreased breath sounds right chest. Left chest has clear breath sounds. Knees without effusions. Hips move well  Assessment: Recurrent transudative right pleural effusion after debulking procedure, abnormal right diaphragm with repair, liver metastases, cholecystectomy. Suspect sympathetic effusion, recurrent. Doubt rheumatoid arthritis inflammatory effusion in the setting of controlled synovitis Rheumatoid Arthritis, off traditional DMARD's but  controlled on chemotherapy Significant  diabetes  Recommendations: Agree with recurrent thoracentesis. Recommend checking rheumatoid factor and pleural fluid. Decide about sclerosing procedure versus chest tube Not a candidate for prolonged high-dose steroid taper.   Liliane Bade W 07/08/2015, 1:03 PM

## 2015-07-08 NOTE — Care Management (Addendum)
Admitted to [facility] with the diagnosis of recurrent pleural effusion. Discharged from this facility 07/03/15. Lives with husband, Suezanne Jacquet, [phone]). Appointment with Dr. Ronette Deter is arranged for next Friday. [facility] through services that were arranged when discharged from [place] services.  No skilled facility. No home oxygen. Uses a rolling walker/cane to aid in ambulation. No falls. No seizures.  Decreased appetite. Husband helps with activities of daily living. Husband will drive home when discharged. Shelbie Ammons RN MSN CCM Care Management [phone]

## 2015-07-08 NOTE — Progress Notes (Addendum)
MEDICATION RELATED CONSULT NOTE - INITIAL   Pharmacy Consult for electrolyte monitoring and replacement Indication: hypokalemia, hypomagnesemia  Allergies  Allergen Reactions  . Codeine Nausea And Vomiting   Patient Measurements: Height: 5\' 3"  (160 cm) Weight: 148 lb 8 oz (67.359 kg) IBW/kg (Calculated) : 52.4  Vital Signs: Temp: 98.8 F (37.1 C) (03/08 2057) Temp Source: Oral (03/08 2057) BP: 121/40 mmHg (03/08 2057) Pulse Rate: 79 (03/08 2057) Intake/Output from previous day:   Intake/Output from this shift:    Labs:  Recent Labs  07/07/15 1958 07/08/15 0530 07/08/15 1755  WBC 5.7 4.1  --   HGB 9.2* 9.5*  --   HCT 27.3* 27.9*  --   PLT 271 254  --   CREATININE 0.74 0.62  --   MG  --  1.2* 2.0   Estimated Creatinine Clearance: 59.5 mL/min (by C-G formula based on Cr of 0.62).  Assessment: Pharmacy consulted to monitor and replace electrolytes in this 70 year old female who has a history of hypokalemia and was prescribed KCl 20 mEq PO BID prior to admission.   Mg low this AM @ 1.2 K low this AM @ 2.8  Goal of Therapy:  Electrolytes within normal limits  Plan:  Give 4 g magnesium IV x 1 dose Ordered 20 mEq PO KCl x 1 dose and KCl 10 mEq IV x 4 runs  Will recheck Mg & K at 1800 tonight Will check Mg/K/Phos with AM labs tomorrow  Thank you for allowing pharmacy to be part of this patient's care.  3/7 @ 18:00 :    K = 3.6                         Mag = 2.0 No supplementation needed.   Will recheck electrolytes on 3/09 with AM labs.    Orene Desanctis, PharmD Clinical Pharmacist 07/08/2015,9:32 PM

## 2015-07-08 NOTE — Progress Notes (Signed)
Inpatient Diabetes Program Recommendations  AACE/ADA: New Consensus Statement on Inpatient Glycemic Control (2015)  Target Ranges:  Prepandial:   less than 140 mg/dL      Peak postprandial:   less than 180 mg/dL (1-2 hours)      Critically ill patients:  140 - 180 mg/dL   Review of Glycemic Control  Diabetes history:Type 1 diabetes (therefore, patient makes no insulin and will require basal, correction, and meal coverage insulin) Outpatient Diabetes medications: Insulin pump with Humalog Current orders for Inpatient glycemic control: Lantus 10 units QHS, Novolog 0-15 units ACHS  Inpatient Diabetes Program Recommendations: Correction (SSI): Patient has Type 1 diabetes and is very sensitive to insulin. Please consider decreasing Novolog 0-15 units ACHS and ordering custom scale for Novolog correction as follows: Novolog 0-5 units ACHS:  1 unit for blood sugars 150-200mg /dl 2 units for blood sugars 201-250   3 units for blood sugar 251-300 4 units for blood sugar 301-350 5 units for blood sugar 351 and greater  Insulin - Meal Coverage: Please order Novolog 0-8 units TID with meals for meal coverage (in which 1 unit of insulin covers 10 grams of carbohydrates). Inpatient Consult: May want to consider consulint Dr. Gabriel Carina for assistance with inpatient glycemic control as patient has Type 1 diabetes and is followed by Dr. Gabriel Carina as an outpatient.  Thanks, Barnie Alderman, RN, MSN, CDE Diabetes Coordinator Inpatient Diabetes Program [phone] (Team Pager from [place] to [place]) [phone] ([facility] office) [phone] [facility] office) [phone] [facility] office)

## 2015-07-08 NOTE — Progress Notes (Signed)
MEDICATION RELATED CONSULT NOTE - INITIAL   Pharmacy Consult for electrolyte monitoring and replacement Indication: hypokalemia, hypomagnesemia  Allergies  Allergen Reactions  . Codeine Nausea And Vomiting   Patient Measurements: Height: 5\' 3"  (160 cm) Weight: 148 lb 8 oz (67.359 kg) IBW/kg (Calculated) : 52.4  Vital Signs: Temp: 98.6 F (37 C) (03/08 0450) Temp Source: Oral (03/08 0450) BP: 142/45 mmHg (03/08 0850) Pulse Rate: 101 (03/08 0450) Intake/Output from previous day:   Intake/Output from this shift: Total I/O In: 483 [P.O.:480; I.V.:3] Out: -   Labs:  Recent Labs  07/07/15 1958 07/08/15 0530  WBC 5.7 4.1  HGB 9.2* 9.5*  HCT 27.3* 27.9*  PLT 271 254  CREATININE 0.74 0.62  MG  --  1.2*   Estimated Creatinine Clearance: 59.5 mL/min (by C-G formula based on Cr of 0.62).  Assessment: Pharmacy consulted to monitor and replace electrolytes in this 70 year old female who has a history of hypokalemia and was prescribed KCl 20 mEq PO BID prior to admission.   Mg low this AM @ 1.2 K low this AM @ 2.8  Goal of Therapy:  Electrolytes within normal limits  Plan:  Give 4 g magnesium IV x 1 dose Ordered 20 mEq PO KCl x 1 dose and KCl 10 mEq IV x 4 runs  Will recheck Mg & K at 1800 tonight Will check Mg/K/Phos with AM labs tomorrow  Thank you for allowing pharmacy to be part of this patient's care.   Lenis Noon, PharmD Clinical Pharmacist 07/08/2015,2:04 PM

## 2015-07-09 ENCOUNTER — Ambulatory Visit: Payer: Medicare Other | Admitting: Internal Medicine

## 2015-07-09 ENCOUNTER — Telehealth: Payer: Self-pay | Admitting: Internal Medicine

## 2015-07-09 LAB — BASIC METABOLIC PANEL
ANION GAP: 7 (ref 5–15)
BUN: 7 mg/dL (ref 6–20)
CALCIUM: 7.4 mg/dL — AB (ref 8.9–10.3)
CO2: 30 mmol/L (ref 22–32)
Chloride: 97 mmol/L — ABNORMAL LOW (ref 101–111)
Creatinine, Ser: 0.59 mg/dL (ref 0.44–1.00)
Glucose, Bld: 186 mg/dL — ABNORMAL HIGH (ref 65–99)
POTASSIUM: 3.3 mmol/L — AB (ref 3.5–5.1)
SODIUM: 134 mmol/L — AB (ref 135–145)

## 2015-07-09 LAB — GLUCOSE, CAPILLARY
GLUCOSE-CAPILLARY: 163 mg/dL — AB (ref 65–99)
Glucose-Capillary: 229 mg/dL — ABNORMAL HIGH (ref 65–99)

## 2015-07-09 LAB — ECHOCARDIOGRAM COMPLETE
Height: 63 in
Weight: 2376 oz

## 2015-07-09 LAB — PHOSPHORUS: Phosphorus: 3 mg/dL (ref 2.5–4.6)

## 2015-07-09 LAB — MAGNESIUM: MAGNESIUM: 1.7 mg/dL (ref 1.7–2.4)

## 2015-07-09 MED ORDER — HEPARIN SOD (PORK) LOCK FLUSH 100 UNIT/ML IV SOLN
500.0000 [IU] | INTRAVENOUS | Status: AC | PRN
  Administered 2015-07-09: 17:00:00 500 [IU]
  Filled 2015-07-09: qty 5

## 2015-07-09 MED ORDER — SODIUM CHLORIDE 0.9% FLUSH
10.0000 mL | INTRAVENOUS | Status: AC | PRN
  Administered 2015-07-09: 17:00:00 10 mL

## 2015-07-09 MED ORDER — POTASSIUM CHLORIDE CRYS ER 20 MEQ PO TBCR
20.0000 meq | EXTENDED_RELEASE_TABLET | Freq: Two times a day (BID) | ORAL | Status: DC
  Administered 2015-07-09: 20 meq via ORAL
  Filled 2015-07-09: qty 1

## 2015-07-09 MED ORDER — MAGNESIUM SULFATE 2 GM/50ML IV SOLN
2.0000 g | Freq: Once | INTRAVENOUS | Status: AC
  Administered 2015-07-09: 11:00:00 2 g via INTRAVENOUS
  Filled 2015-07-09: qty 50

## 2015-07-09 NOTE — Progress Notes (Signed)
MEDICATION RELATED CONSULT NOTE - INITIAL   Pharmacy Consult for electrolyte monitoring and replacement Indication: hypokalemia, hypomagnesemia  Allergies  Allergen Reactions  . Codeine Nausea And Vomiting   Patient Measurements: Height: 5\' 3"  (160 cm) Weight: 148 lb 8 oz (67.359 kg) IBW/kg (Calculated) : 52.4  Vital Signs: Temp: 98.9 F (37.2 C) (03/09 0546) Temp Source: Oral (03/09 0546) BP: 136/50 mmHg (03/09 0752) Pulse Rate: 79 (03/09 0752) Intake/Output from previous day: 03/08 0701 - 03/09 0700 In: 723 [P.O.:720; I.V.:3] Out: 750 [Urine:750] Intake/Output from this shift:    Labs:  Recent Labs  07/07/15 1958 07/08/15 0530 07/08/15 1755 07/09/15 0614  WBC 5.7 4.1  --   --   HGB 9.2* 9.5*  --   --   HCT 27.3* 27.9*  --   --   PLT 271 254  --   --   CREATININE 0.74 0.62  --  0.59  MG  --  1.2* 2.0 1.7  PHOS  --   --   --  3.0   Estimated Creatinine Clearance: 59.5 mL/min (by C-G formula based on Cr of 0.59).  Assessment: Pharmacy consulted to monitor and replace electrolytes in this 70 year old female who has a history of hypokalemia and was prescribed KCl 20 mEq PO BID prior to admission.   Mg and K have improved from yesterday. Remain slightly low with a Mg of 1.7 and K of 3.3.  Phos of 3.0 is WNL  Goal of Therapy:  Electrolytes within normal limits  Plan:  Give magnesium 2 g IV x 1 dose Resume patient's home dose of KCl 20 mEq PO BID  Will recheck K/Mg with AM labs tomorrow. Thank you for allowing pharmacy to be part of this patient's care.  Lenis Noon, PharmD Clinical Pharmacist 07/09/2015,8:11 AM

## 2015-07-09 NOTE — Progress Notes (Signed)
Patient ID: [name], female   DOB: [birth year], 70 y.o.   MRN: [id number] [facility] [place] NAME: [name]    MR#:  [id number]  DATE OF BIRTH:  [birth year]  SUBJECTIVE:   Patient feels a whole lot better after fluid was removed from the right lung yesterday. She is trying to eat her lunch. Denies any other complaints. Husband at bedside. REVIEW OF SYSTEMS:   Review of Systems  Constitutional: Negative for fever, chills and weight loss.  HENT: Negative for ear discharge, ear pain and nosebleeds.   Eyes: Negative for blurred vision, pain and discharge.  Respiratory: Positive for shortness of breath. Negative for sputum production, wheezing and stridor.   Cardiovascular: Negative for chest pain, palpitations, orthopnea and PND.  Gastrointestinal: Negative for nausea, vomiting, abdominal pain and diarrhea.  Genitourinary: Negative for urgency and frequency.  Musculoskeletal: Negative for back pain and joint pain.  Neurological: Positive for weakness. Negative for sensory change, speech change and focal weakness.  Psychiatric/Behavioral: Negative for depression and hallucinations. The patient is not nervous/anxious.   All other systems reviewed and are negative.  Tolerating Diet: Yes Tolerating PT: Ambulate by herself  DRUG ALLERGIES:   Allergies  Allergen Reactions  . Codeine Nausea And Vomiting    VITALS:  Blood pressure 121/42, pulse 93, temperature 99.3 F (37.4 C), temperature source Oral, resp. rate 20, height 5\' 3"  (1.6 m), weight 67.359 kg (148 lb 8 oz), SpO2 91 %.  PHYSICAL EXAMINATION:   Physical Exam  GENERAL:  70 y.o.-year-old patient lying in the bed with no acute distress.  EYES: Pupils equal, round, reactive to light and accommodation. No scleral icterus. Extraocular muscles intact.  HEENT: Head atraumatic, normocephalic. Oropharynx and nasopharynx clear. alopecia NECK:  Supple, no jugular venous  distention. No thyroid enlargement, no tenderness.  LUNGS: Decreased breath sounds bilaterally, no wheezing, rales, rhonchi. No use of accessory muscles of respiration.  CARDIOVASCULAR: S1, S2 normal. No murmurs, rubs, or gallops.  ABDOMEN: Soft, nontender, nondistended. Bowel sounds present. No organomegaly or mass.  EXTREMITIES: No cyanosis, clubbing . Mild pedal edema   NEUROLOGIC: Cranial nerves II through XII are intact. No focal Motor or sensory deficits b/l.   PSYCHIATRIC:  patient is alert and oriented x 3.  SKIN: No obvious rash, lesion, or ulcer.   LABORATORY PANEL:  CBC  Recent Labs Lab 07/08/15 0530  WBC 4.1  HGB 9.5*  HCT 27.9*  PLT 254    Chemistries   Recent Labs Lab 07/09/15 0614  NA 134*  K 3.3*  CL 97*  CO2 30  GLUCOSE 186*  BUN 7  CREATININE 0.59  CALCIUM 7.4*  MG 1.7   Cardiac Enzymes No results for input(s): TROPONINI in the last 168 hours. RADIOLOGY:  X-ray Chest Pa Or Ap  07/08/2015  CLINICAL DATA:  70 year old female status post right-sided thoracentesis. EXAM: CHEST 1 VIEW COMPARISON:  Pre thoracentesis chest x-ray 07/07/2015 FINDINGS: No evidence of pneumothorax post right-sided thoracentesis. Significantly reduced volume of right pleural effusion with re-expansion of the right lower lobe. There is persistent right middle lobe atelectasis. Stable cardiac and mediastinal contours. Right IJ approach single-lumen power injectable port catheter with the catheter tip overlying the superior cavoatrial junction. Patient is status post median sternotomy with evidence of prior multivessel CABG including LIMA bypass. Stable small left pleural effusion. No new airspace consolidation. No acute osseous abnormality. IMPRESSION: 1. No evidence of pneumothorax status post right-sided thoracentesis. 2. Significantly  decreased right-sided pleural effusion with re-expansion of the right lower lobe persistent atelectasis in the right middle lobe. Electronically Signed    By: Jacqulynn Cadet M.D.   On: 07/08/2015 15:54   US Thoracentesis Asp Pleural Space W/img Guide  07/08/2015  INDICATION: 70 year old female with symptomatic right pleural effusion EXAM: ULTRASOUND GUIDED RIGHT THORACENTESIS MEDICATIONS: None. COMPLICATIONS: None immediate. PROCEDURE: An ultrasound guided thoracentesis was thoroughly discussed with the patient and questions answered. The benefits, risks, alternatives and complications were also discussed. The patient understands and wishes to proceed with the procedure. Written consent was obtained. Ultrasound was performed to localize and mark an adequate pocket of fluid in the right chest. The area was then prepped and draped in the normal sterile fashion. 1% Lidocaine was used for local anesthesia. Under ultrasound guidance a 6 Fr Safe-T-Centesis catheter was introduced. Thoracentesis was performed. The catheter was removed and a dressing applied. FINDINGS: A total of approximately 150 mL of yellow pleural fluid was removed. Samples were sent to the laboratory as requested by the clinical team. IMPRESSION: Successful ultrasound guided right thoracentesis yielding 850 mL of pleural fluid. Electronically Signed   By: Jacqulynn Cadet M.D.   On: 07/08/2015 15:40   ASSESSMENT AND PLAN:  [name] is a 70 y.o. female who presents with dyspnea and decreased O2 sats. Patient has a history of ovarian cancer, and has recently had procedures done including lysis of adhesions of her GI tract. She follows with Dr. Mike Gip as her oncologist. She was admitted here about a week ago with shortness of breath/dyspnea most likely related to a right-sided pleural effusion.  1.Recurrent pleural effusion on right  -p redominance of neutrophils, no cancer cells noted on pathology report. In the past -Repeat Cytology from thoracentesis 07/08/2015 is still pending  -This is patient's third thoracentesis and last 3-4 weeks. - Exact etiology unclear. Could this be due  to her malignancy not known. - echo of the heart. Shows EF of 55-60%. No evidence of heart failure. -resend fluid for cytology and RA factor. Patient is status post thoracentesis 07/08/2015. 850 cc of fluid was removed. Appears transudative. -Spoke at length with Dr. Mike Gip. Okay for patient to go home. She will follow-up with her as outpatient. If repeat pleural effusion to occur patient will get Pleurx catheter placed -Patient sats drop in the upper 80s. On ambulation. Recovered with 2 L nasal cannula to 90s. We'll set up home oxygen 2 L/m.  2.  Ovarian  cancer ([place]) - patient is currently rotating through cycles of chemotherapy, with 2 cycles remaining per her report.    3. Hypertension - currently at goal, continue home meds  4.  RA (rheumatoid arthritis) ([place]) - patient came off of her DMARD's when she started chemotherapy. Rheumatology consult as above.  5.  Diabetes type 1, controlled ([place]) - resume insulin pump at discharge. -Heart healthy/carb modified diet.    6. CAD (coronary artery disease) of artery bypass graft - continue home meds  7. Hyperlipidemia - continue home meds  8.  GERD (gastroesophageal reflux disease) - home dose H2 blocker  Case discussed with Care Management/Social Worker. Management plans discussed with the patient, family and they are in agreement.  CODE STATUS: full   DVT ProphylaxisLOVENOX TOTAL TIME TAKING CARE OF THIS PATIENT:35inutes.  >50% time spent on counselling and coordination of care patient, husband, Dr. Mike Gip and [place] home today. Note: This dictation was prepared with Dragon dictation along with smaller phrase technology. Any transcriptional errors  that result from this process are unintentional.  Fama Muenchow M.D on 07/09/2015 at 1:53 PM  Between 7am to 6pm - Pager - [phone]  After 6pm go to [email or web address] - password [email or web address] [facility]  Office  [phone]  CC: Primary care physician;  Rica Mast, MD

## 2015-07-09 NOTE — Telephone Encounter (Signed)
HFU, Pt is being discharged today. Diagnosis is recurrent pleural effusion. Pt is scheduled for 03/17 @ 10:00am. Thank you!

## 2015-07-09 NOTE — Progress Notes (Signed)
Pt is being discharged home with home health. Pt on 2L O2 via O2 tank. Discharge instructions given and explained to pt. Pt verbalized understanding. No RX at this time. Meds and follow up appointments reviewed with pt.

## 2015-07-09 NOTE — Consult Note (Signed)
WOC ostomy consult note Stoma type/location: LLQ COlostomy spouse has been caring for this at home.  Has a question about peristomal skin.  Minimal separation noted.  WIll add stomal powder to defect.  Pouch change performed and demonstrated for patient.   Stomal assessment/size: 1 1/2" flush stoma with defect at 3:00.  Stoma powder added to defect Peristomal assessment: intact  Separation, minimal noted Treatment options for stomal/peristomal skin: Barrier ring. Convex pouch and stoma powder Output brown stool Ostomy pouching: 1pc.convex Education provided:Spouse taught to fill in defect.    Enrolled patient in [facility] program: Yes Will not follow at this time.  Please re-consult if needed.  Domenic Moras RN BSN [place] Pager [phone]

## 2015-07-09 NOTE — Discharge Instructions (Signed)
Use oxygen 2 L/m is instructed continuous  Resume home health services.

## 2015-07-09 NOTE — Progress Notes (Signed)
Inpatient Diabetes Program Recommendations  AACE/ADA: New Consensus Statement on Inpatient Glycemic Control (2015)  Target Ranges:  Prepandial:   less than 140 mg/dL      Peak postprandial:   less than 180 mg/dL (1-2 hours)      Critically ill patients:  140 - 180 mg/dL   Review of Glycemic Control  Review of Glycemic Control  Diabetes history:Type 1 diabetes (therefore, patient makes no insulin and will require basal, correction, and meal coverage insulin) Outpatient Diabetes medications: Insulin pump with Humalog  Current orders for Inpatient glycemic control: Lantus 10 units QHS, Novolog 0-15 units ACHS  Inpatient Diabetes Program Recommendations: Correction (SSI): Patient has Type 1 diabetes and is very sensitive to insulin. Please consider decreasing Novolog 0-15 units ACHS and ordering custom scale for Novolog correction as follows: Novolog 0-5 units ACHS: 1 unit for blood sugars 150-200mg /dl 2 units for blood sugars 201-250  3 units for blood sugar 251-300 4 units for blood sugar 301-350 5 units for blood sugar 351 and greater  Insulin - Meal Coverage: Please order Novolog 0-8 units TID with meals for meal coverage (in which 1 unit of insulin covers 10 grams of carbohydrates). Inpatient Consult: May want to consider consulint Dr. Gabriel Carina for assistance with inpatient glycemic control as patient has Type 1 diabetes and is followed by Dr. Gabriel Carina as an outpatient.  Spoke to Dr. Posey Pronto re: my recommendations and about hospital policy re: insulin pumps   Gentry Fitz, RN, [place], [place], CDE Diabetes Coordinator Inpatient Diabetes Program  [phone] (Team Pager) [phone] ([place]) 07/09/2015 7:46 AM

## 2015-07-09 NOTE — Discharge Summary (Signed)
[employer] [facility] [place] NAME: [name]    MR#:  [id number]  DATE OF BIRTH:  07-May-1945  DATE OF ADMISSION:  07/07/2015 ADMITTING PHYSICIAN: Lance Coon, MD  DATE OF DISCHARGE: 07/09/2015  PRIMARY CARE PHYSICIAN: Rica Mast, MD    ADMISSION DIAGNOSIS:  Pleural effusion [J90] Hypoxia [R09.02]  DISCHARGE DIAGNOSIS:  Recurrent right-sided pleural effusion status post thoracentesis 07/08/2015 removal of transudate 850 cc of fluid cytology pending. Ovarian cancer undergoing chemotherapy status post recent debulking surgery Hypoxia now requiring oxygen  SECONDARY DIAGNOSIS:   Past Medical History  Diagnosis Date  . Leukopenia 2012    s/p bone marrow biopsy, Dr. Ma Hillock  . Cholelithiasis   . GERD (gastroesophageal reflux disease)   . Hypertension   . Hyperlipidemia   . Esophageal stricture   . Chronic diastolic CHF (congestive heart failure) ([place])   . CAD (coronary artery disease)     a. 09/2012 Cath: LM nl, LAD 95p, 49m LCX 944mOM2 50, RCA 100.  . Marland Kitchenypokalemia   . Herniated disc   . Pneumonia 2013; 08/2012    "one lung; double" (10/18/2012)  . Exertional shortness of breath   . Type I diabetes mellitus ([facility]    "dx'd in 1957" (10/18/2012)  . History of pancytopenia   . H/O hiatal hernia   . Rheumatoid arthritis(714.0)   . NSTEMI (non-ST elevated myocardial infarction) ([facility]    "mild" (10/18/2012)  . Collagen vascular disease ([facility]  . [facility]    HOSPITAL COURSE:   SaHalena [name] a 70.o. female who presents with dyspnea and decreased O2 sats. Patient has a history of ovarian cancer, and has recently had procedures done including lysis of adhesions of her GI tract. She follows with Dr. CoMike Gips her oncologist. She was admitted here about a week ago with shortness of breath/dyspnea most likely related to a right-sided pleural effusion.  1.Recurrent pleural effusion on right  -p  redominance of neutrophils, no cancer cells noted on pathology report. In the past -Repeat Cytology from thoracentesis 07/08/2015 is still pending  -This is patient's third thoracentesis and last 3-4 weeks. - Exact etiology unclear. Could this be due to her malignancy not known. - echo of the heart. Shows EF of 55-60%. No evidence of heart failure. -resend fluid for cytology and RA factor. Patient is status post thoracentesis 07/08/2015. 850 cc of fluid was removed. Appears transudative. -Spoke at length with Dr. CoMike GipOkay for patient to go home. She will follow-up with her as outpatient. If repeat pleural effusion to occur patient will get Pleurx catheter placed -Patient sats drop in the upper 80s. On ambulation. Recovered with 2 L nasal cannula to 90s. We'll set up home oxygen 2 L/m.  2. Ovarian cancer ([facility]- patient is currently rotating through cycles of chemotherapy, with 2 cycles remaining per her report.   3. Hypertension - currently at goal, continue home meds  4.  RA (rheumatoid arthritis) ([facility]- patient came off of her DMARD's when she started chemotherapy. Rheumatology consult as above.  5.  Diabetes type 1, controlled ([facility]- resume insulin pump at discharge. -Heart healthy/carb modified diet.   6. CAD (coronary artery disease) of artery bypass graft - continue home meds  7. Hyperlipidemia - continue home meds  8.  GERD (gastroesophageal reflux disease) - home dose H2  CONSULTS OBTAINED:  Treatment Team:  GeEmmaline Kluver MD  DRUG ALLERGIES:  Allergies  Allergen Reactions  . Codeine Nausea And Vomiting    DISCHARGE MEDICATIONS:   Current Discharge Medication List    CONTINUE these medications which have NOT CHANGED   Details  amLODipine (NORVASC) 5 MG tablet Take 5 mg by mouth daily.    atorvastatin (LIPITOR) 80 MG tablet Take 80 mg by mouth every evening.    enoxaparin (LOVENOX) 40 MG/0.4ML injection Inject 40 mg into the skin daily.      furosemide (LASIX) 40 MG tablet Take 1 tablet (40 mg total) by mouth 2 (two) times daily. Qty: 180 tablet, Refills: 3    gabapentin (NEURONTIN) 300 MG capsule Take 1 capsule (300 mg total) by mouth 3 (three) times daily. Qty: 90 capsule, Refills: 1    ibuprofen (ADVIL,MOTRIN) 800 MG tablet Take 800 mg by mouth 3 (three) times daily as needed for mild pain.    Insulin Human (INSULIN PUMP) SOLN Pt uses Humalog insulin.    lidocaine-prilocaine (EMLA) cream Apply 1 application topically as needed (prior to accessing port).    loratadine (CLARITIN) 10 MG tablet Take 10 mg by mouth every evening.     losartan (COZAAR) 100 MG tablet Take 1 tablet (100 mg total) by mouth daily. Qty: 90 tablet, Refills: 3    metoprolol tartrate (LOPRESSOR) 25 MG tablet Take 25 mg by mouth 2 (two) times daily.    Multiple Vitamin (MULTIVITAMIN WITH MINERALS) TABS tablet Take 1 tablet by mouth daily.    ondansetron (ZOFRAN) 8 MG tablet Take 1 tablet (8 mg total) by mouth every 8 (eight) hours as needed for nausea or vomiting. Qty: 20 tablet, Refills: 1    potassium chloride SA (K-DUR,KLOR-CON) 20 MEQ tablet Take 20 mEq by mouth 2 (two) times daily.    ranitidine (ZANTAC) 150 MG tablet Take 1 tablet (150 mg total) by mouth 2 (two) times daily. Qty: 180 tablet, Refills: 2   Associated Diagnoses: Gastroesophageal reflux disease without esophagitis; History of esophageal stricture; Dysphagia, pharyngoesophageal phase    zoledronic acid (RECLAST) 5 MG/100ML SOLN Inject 5 mg into the vein See admin instructions. Pt gets an injection once per year.        If you experience worsening of your admission symptoms, develop shortness of breath, life threatening emergency, suicidal or homicidal thoughts you must seek medical attention immediately by calling 911 or calling your MD immediately  if symptoms less severe.  You Must read complete instructions/literature along with all the possible adverse reactions/side  effects for all the Medicines you take and that have been prescribed to you. Take any new Medicines after you have completely understood and accept all the possible adverse reactions/side effects.   Please note  You were cared for by a hospitalist during your hospital stay. If you have any questions about your discharge medications or the care you received while you were in the hospital after you are discharged, you can call the unit and asked to speak with the hospitalist on call if the hospitalist that took care of you is not available. Once you are discharged, your primary care physician will handle any further medical issues. Please note that NO REFILLS for any discharge medications will be authorized once you are discharged, as it is imperative that you return to your primary care physician (or establish a relationship with a primary care physician if you do not have one) for your aftercare needs so that they can reassess your need for medications and monitor your lab values.  DATA REVIEW:  CBC   Recent Labs Lab 07/08/15 0530  WBC 4.1  HGB 9.5*  HCT 27.9*  PLT 254    Chemistries   Recent Labs Lab 07/09/15 0614  NA 134*  K 3.3*  CL 97*  CO2 30  GLUCOSE 186*  BUN 7  CREATININE 0.59  CALCIUM 7.4*  MG 1.7    Microbiology Results   Recent Results (from the past 240 hour(s))  Body fluid culture     Status: None   Collection Time: 07/02/15  3:25 PM  Result Value Ref Range Status   Specimen Description PLEURAL  Final   Special Requests Normal  Final   Gram Stain   Final    MODERATE RED BLOOD CELLS MODERATE WBC SEEN NO ORGANISMS SEEN    Culture No growth aerobically or anaerobically.  Final   Report Status 07/08/2015 FINAL  Final    RADIOLOGY:  X-ray Chest Pa Or Ap  07/08/2015  CLINICAL DATA:  70 year old female status post right-sided thoracentesis. EXAM: CHEST 1 VIEW COMPARISON:  Pre thoracentesis chest x-ray 07/07/2015 FINDINGS: No evidence of pneumothorax post  right-sided thoracentesis. Significantly reduced volume of right pleural effusion with re-expansion of the right lower lobe. There is persistent right middle lobe atelectasis. Stable cardiac and mediastinal contours. Right IJ approach single-lumen power injectable port catheter with the catheter tip overlying the superior cavoatrial junction. Patient is status post median sternotomy with evidence of prior multivessel CABG including LIMA bypass. Stable small left pleural effusion. No new airspace consolidation. No acute osseous abnormality. IMPRESSION: 1. No evidence of pneumothorax status post right-sided thoracentesis. 2. Significantly decreased right-sided pleural effusion with re-expansion of the right lower lobe persistent atelectasis in the right middle lobe. Electronically Signed   By: Jacqulynn Cadet M.D.   On: 07/08/2015 15:54   US Thoracentesis Asp Pleural Space W/img Guide  07/08/2015  INDICATION: 70 year old female with symptomatic right pleural effusion EXAM: ULTRASOUND GUIDED RIGHT THORACENTESIS MEDICATIONS: None. COMPLICATIONS: None immediate. PROCEDURE: An ultrasound guided thoracentesis was thoroughly discussed with the patient and questions answered. The benefits, risks, alternatives and complications were also discussed. The patient understands and wishes to proceed with the procedure. Written consent was obtained. Ultrasound was performed to localize and mark an adequate pocket of fluid in the right chest. The area was then prepped and draped in the normal sterile fashion. 1% Lidocaine was used for local anesthesia. Under ultrasound guidance a 6 Fr Safe-T-Centesis catheter was introduced. Thoracentesis was performed. The catheter was removed and a dressing applied. FINDINGS: A total of approximately 150 mL of yellow pleural fluid was removed. Samples were sent to the laboratory as requested by the clinical team. IMPRESSION: Successful ultrasound guided right thoracentesis yielding 850 mL of  pleural fluid. Electronically Signed   By: Jacqulynn Cadet M.D.   On: 07/08/2015 15:40     Management plans discussed with the patient, family and they are in agreement.  CODE STATUS:     Code Status Orders        Start     Ordered   07/07/15 2250  Full code   Continuous     07/07/15 2249    Code Status History    Date Active Date Inactive Code Status Order ID Comments User Context   07/01/2015  8:06 PM 07/03/2015  4:36 PM Full Code [id number]  Fritzi Mandes, MD Inpatient   03/02/2015 10:28 AM 03/03/2015 10:50 AM Full Code [id number]  Algernon Huxley, MD Inpatient   10/19/2012  3:03 PM 10/21/2012 12:23 PM  Full Code [id number]  Nani Skillern, PA-C Inpatient    Advance Directive Documentation        Most Recent Value   Type of Advance Directive  Healthcare Power of [place], Living will [Ben Rodgers (Husband)]   Pre-existing out of facility DNR order (yellow form or pink MOST form)     "MOST" Form in Place?        TOTAL TIME TAKING CARE OF THIS PATIENT: 40 minutes.    Shlok Raz M.D on 07/09/2015 at 2:01 PM  Between 7am to 6pm - Pager - [phone] After 6pm go to [email or web address] - password [email or web address] [place] [facility]  Office  [phone]  CC: Primary care physician; Rica Mast, MD

## 2015-07-09 NOTE — Care Management (Signed)
Qualifies for home oxygen. Spoke with Mr & Ms. Keady at the bedside. Discussed durable medical equipment companies. [place]. Floydene Flock, [facility] representative updated. Discharge to home today per Dr. Posey Pronto. Will resume home health orders through [facility] out of [place]. Husband will transport.  Shelbie Ammons RN MSN CCM Care Management [phone]

## 2015-07-09 NOTE — Progress Notes (Signed)
SATURATION QUALIFICATIONS: (This note is used to comply with regulatory documentation for home oxygen)  Patient Saturations on Room Air at Rest = 92%  Patient Saturations on Room Air while Ambulating = 84%  Patient Saturations on 2 Liters of oxygen while Ambulating = 94%  Please briefly explain why patient needs home oxygen: CHF/lung cancer

## 2015-07-10 ENCOUNTER — Telehealth: Payer: Self-pay

## 2015-07-10 ENCOUNTER — Inpatient Hospital Stay (HOSPITAL_BASED_OUTPATIENT_CLINIC_OR_DEPARTMENT_OTHER): Payer: Medicare Other | Admitting: Hematology and Oncology

## 2015-07-10 VITALS — BP 116/51 | HR 90 | Temp 99.4°F | Resp 18 | Ht 63.0 in | Wt 149.3 lb

## 2015-07-10 DIAGNOSIS — I5032 Chronic diastolic (congestive) heart failure: Secondary | ICD-10-CM | POA: Diagnosis not present

## 2015-07-10 DIAGNOSIS — R0602 Shortness of breath: Secondary | ICD-10-CM | POA: Diagnosis not present

## 2015-07-10 DIAGNOSIS — R5383 Other fatigue: Secondary | ICD-10-CM | POA: Diagnosis not present

## 2015-07-10 DIAGNOSIS — M7989 Other specified soft tissue disorders: Secondary | ICD-10-CM | POA: Diagnosis not present

## 2015-07-10 DIAGNOSIS — J90 Pleural effusion, not elsewhere classified: Secondary | ICD-10-CM | POA: Diagnosis not present

## 2015-07-10 DIAGNOSIS — Z79899 Other long term (current) drug therapy: Secondary | ICD-10-CM

## 2015-07-10 DIAGNOSIS — C786 Secondary malignant neoplasm of retroperitoneum and peritoneum: Secondary | ICD-10-CM

## 2015-07-10 DIAGNOSIS — I2510 Atherosclerotic heart disease of native coronary artery without angina pectoris: Secondary | ICD-10-CM

## 2015-07-10 DIAGNOSIS — Z87891 Personal history of nicotine dependence: Secondary | ICD-10-CM

## 2015-07-10 DIAGNOSIS — C569 Malignant neoplasm of unspecified ovary: Secondary | ICD-10-CM | POA: Diagnosis not present

## 2015-07-10 DIAGNOSIS — E785 Hyperlipidemia, unspecified: Secondary | ICD-10-CM | POA: Diagnosis not present

## 2015-07-10 DIAGNOSIS — Z90722 Acquired absence of ovaries, bilateral: Secondary | ICD-10-CM | POA: Diagnosis not present

## 2015-07-10 DIAGNOSIS — E109 Type 1 diabetes mellitus without complications: Secondary | ICD-10-CM | POA: Diagnosis not present

## 2015-07-10 DIAGNOSIS — K219 Gastro-esophageal reflux disease without esophagitis: Secondary | ICD-10-CM | POA: Diagnosis not present

## 2015-07-10 DIAGNOSIS — Z90710 Acquired absence of both cervix and uterus: Secondary | ICD-10-CM

## 2015-07-10 DIAGNOSIS — Z794 Long term (current) use of insulin: Secondary | ICD-10-CM

## 2015-07-10 DIAGNOSIS — G629 Polyneuropathy, unspecified: Secondary | ICD-10-CM | POA: Diagnosis not present

## 2015-07-10 DIAGNOSIS — R630 Anorexia: Secondary | ICD-10-CM

## 2015-07-10 DIAGNOSIS — R0600 Dyspnea, unspecified: Secondary | ICD-10-CM

## 2015-07-10 DIAGNOSIS — I252 Old myocardial infarction: Secondary | ICD-10-CM | POA: Diagnosis not present

## 2015-07-10 DIAGNOSIS — K449 Diaphragmatic hernia without obstruction or gangrene: Secondary | ICD-10-CM

## 2015-07-10 DIAGNOSIS — M069 Rheumatoid arthritis, unspecified: Secondary | ICD-10-CM | POA: Diagnosis not present

## 2015-07-10 DIAGNOSIS — E876 Hypokalemia: Secondary | ICD-10-CM | POA: Diagnosis not present

## 2015-07-10 DIAGNOSIS — I10 Essential (primary) hypertension: Secondary | ICD-10-CM

## 2015-07-10 LAB — CYTOLOGY - NON PAP

## 2015-07-10 LAB — RHEUMATOID FACTORS, FLUID

## 2015-07-10 NOTE — Progress Notes (Signed)
[facility] day:  07/10/2015   Chief Complaint: [name] is a 70 y.o. 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  HPI:  The patient was last seen in the medical oncology clinic on 06/05/2015.  At that time, she was seen for assessment on day 15 stauts post cycle #4.  She had received 3 days of GCSF for neutropenia associated with cycle #4.  Symptomatically, she was doing well.  Her decubitus ulcer was healing.  She was scheduled for follow-up at [facility] for surgery.  She underwent exploratory laparotomy, lysis of adhesions, total abdominal hysterectomy with bilateral salpingo-oophorectomy, infracolic omentectomy, optimal tumor debulking(< 1 cm), recto-sigmoid resection with creation of end colostomy, cholecystectomy, mobilization of splenic flexure and liver with diaphragmatic stripping on 06/15/2015. The right diaphragm was cleared of tumor. During dissection, the diaphragm was entered and closed with sutures. She tolerated her surgery well.  On approximately post-operative day 3, she had 650 cc of right sided pleural fluid removed. Cytology is unavailable.  She was discharged home on 06/22/2015.   She presented to [facility] on 07/01/2015 with a week history of progressive shortness of breath. CXR on 07/01/2015 revealed a large right pleural effusion with consolidation in the right middle and lower lobes probably due to compressive atelectasis. She denies any fever or chills.  Ultrasound guided thoracentesis on 07/02/2015 yielded 1.1 L of amber colored fluid (cytology negative).  She underwent bilateral lower extremity duplex on 07/03/2015 for edema.  Duplex was negative.  She was discharged on 07/03/2015.  She represented to [facility] on 07/07/2015 with shortness of breath and hypoxia.  CXR revealed a moderate size right  pleural effusion with a small air collection in the right lower pleural space.  Ultrasound guided thoracentesis on 07/08/2015 yielded 850 cc of yellow pleural fluid (cytology negative).  Echo on 07/08/2015 revealed en EF of 55-60%.  On ambulation, her oxygen saturations dropped in the upper 80s (90s with oxygen).  She was discharged on 07/09/2015 with oxygen.  Symptomatically, she feels sleepy.  Appetite is poor.  She notes not much feeling in her fingers.  She is dropping things and affecting her ADLs.  She has been off her Neurontin (300 mg TID) for a few days.  She needs assistance with getting dressed and showering.  She was transiently off her insulin pump (blood sugar 400).  Past Medical History  Diagnosis Date  . Leukopenia 2012    s/p bone marrow biopsy, Dr. Ma Hillock  . Cholelithiasis   . GERD (gastroesophageal reflux disease)   . Hypertension   . Hyperlipidemia   . Esophageal stricture   . Chronic diastolic CHF (congestive heart failure) ([place])   . CAD (coronary artery disease)     a. 09/2012 Cath: LM nl, LAD 95p, 27m LCX 977mOM2 50, RCA 100.  . Marland Kitchenypokalemia   . Herniated disc   . Pneumonia 2013; 08/2012    "one lung; double" (10/18/2012)  . Exertional shortness of breath   . Type I diabetes mellitus ([place]    "dx'd in 1957" (10/18/2012)  . History of pancytopenia   . H/O hiatal hernia   . Rheumatoid arthritis(714.0)   . NSTEMI (non-ST elevated myocardial infarction) (HCFoster5/2014    "mild" (10/18/2012)  . Collagen vascular disease ([facility]  . [facility]    Past Surgical History  Procedure Laterality Date  .  Esophagogastroduodenoscopy  2012    Dr. Minna Merritts  . Tubal ligation  1970  . Cataract extraction w/ intraocular lens implant Right 2010  . Cardiac catheterization  10/18/2012    "first one was today" (10/18/2012)  . Esophageal dilation      "3 or 4 times" (10/19/2011)  . Coronary artery bypass graft N/A 10/19/2012    Procedure: CORONARY ARTERY BYPASS GRAFTING  (CABG);  Surgeon: Melrose Nakayama, MD;  Location: [place];  Service: Open Heart Surgery;  Laterality: N/A;  . Peripheral vascular catheterization N/A 03/02/2015    Procedure: Porta Cath Insertion;  Surgeon: Algernon Huxley, MD;  Location: [place] [facility];  Service: Cardiovascular;  Laterality: N/A;  . Abdominal hysterectomy  06/15/2015    Dx L/S, EXLAP TAH BSO omentectomy RSRx colostomy diaphragm resection stripping  . Cholecystectomy  06/15/2015    combined case with ovarian cancer debulking  . Colon surgery    . Ostomy      Family History  Problem Relation Age of Onset  . Diabetes Mother   . Arthritis Mother   . Diabetes Father   . Arthritis Father   . Bone cancer Sister     Social History:  reports that she quit smoking about 21 years ago. Her smoking use included Cigarettes. She has a 30 pack-year smoking history. She has never used smokeless tobacco. She reports that she does not drink alcohol or use illicit drugs.  The patient is accompanied a gentleman Lennette Bihari) today.  Allergies:  Allergies  Allergen Reactions  . Codeine Nausea And Vomiting    Current Medications: Current Outpatient Prescriptions  Medication Sig Dispense Refill  . amLODipine (NORVASC) 5 MG tablet Take 5 mg by mouth daily.    Marland Kitchen atorvastatin (LIPITOR) 80 MG tablet Take 80 mg by mouth every evening.    . enoxaparin (LOVENOX) 40 MG/0.4ML injection Inject 40 mg into the skin daily.     . furosemide (LASIX) 40 MG tablet Take 1 tablet (40 mg total) by mouth 2 (two) times daily. 180 tablet 3  . gabapentin (NEURONTIN) 300 MG capsule Take 1 capsule (300 mg total) by mouth 3 (three) times daily. 90 capsule 1  . ibuprofen (ADVIL,MOTRIN) 800 MG tablet Take 800 mg by mouth 3 (three) times daily as needed for mild pain.    . Insulin Human (INSULIN PUMP) SOLN Pt uses Humalog insulin.    Marland Kitchen lidocaine-prilocaine (EMLA) cream Apply 1 application topically as needed (prior to accessing port).    . loratadine (CLARITIN) 10  MG tablet Take 10 mg by mouth every evening.     Marland Kitchen losartan (COZAAR) 100 MG tablet Take 1 tablet (100 mg total) by mouth daily. 90 tablet 3  . metoprolol tartrate (LOPRESSOR) 25 MG tablet Take 25 mg by mouth 2 (two) times daily.    . Multiple Vitamin (MULTIVITAMIN WITH MINERALS) TABS tablet Take 1 tablet by mouth daily.    . ondansetron (ZOFRAN) 8 MG tablet Take 1 tablet (8 mg total) by mouth every 8 (eight) hours as needed for nausea or vomiting. 20 tablet 1  . potassium chloride SA (K-DUR,KLOR-CON) 20 MEQ tablet Take 20 mEq by mouth 2 (two) times daily.    . ranitidine (ZANTAC) 150 MG tablet Take 1 tablet (150 mg total) by mouth 2 (two) times daily. 180 tablet 2  . zoledronic acid (RECLAST) 5 MG/100ML SOLN Inject 5 mg into the vein See admin instructions. Pt gets an injection once per year.     No current  facility-administered medications for this visit.   Facility-Administered Medications Ordered in Other Visits  Medication Dose Route Frequency Provider Last Rate Last Dose  . sodium chloride 0.9 % injection 10 mL  10 mL Intracatheter PRN Lequita Asal, MD      . sodium chloride 0.9 % injection 10 mL  10 mL Intravenous PRN Lequita Asal, MD   10 mL at 04/13/15 2202    Review of Systems:  GENERAL:  Feels sleepy.  Fatigued.  No fevers or sweats.  Weight up 8 pounds since last visit. PERFORMANCE STATUS (ECOG):  3 HEENT:  No visual changes, sore throat, mouth sores or tenderness. Lungs:  Mild shortness of breath on oxygen.  No cough.  No hemoptysis. Cardiac:  No chest pain, palpitations, orthopnea, or PND. GI:  Appetite modest.  Emesis last evening.  No constipation, diarrhea, melena or hematochezia. GU:  No urgency, frequency, dysuria, or hematuria. Musculoskeletal:  Severe rheumatoid arthritis.  Osteoporosis.  No back pain. No muscle tenderness. Extremities:  No pain or swelling. Skin:  Decubitus ulcer, nearly healed.  No rashes or skin changes. Neuro:  Neuropathy.  No headache,  numbness or weakness, balance or coordination issues. Endocrine:  Diabetes.  Insulin pump.  No thyroid issues, hot flashes or night sweats. Psych:  No mood changes, depression or anxiety. Pain:  No focal pain. Review of systems:  All other systems reviewed and found to be negative.  Physical Exam: Blood pressure 116/51, pulse 90, temperature 99.4 F (37.4 C), resp. rate 18, height 5' 3"  (1.6 m), weight 149 lb 4 oz (67.7 kg), SpO2 98 %. GENERAL:  Fatigued appearing woman sitting comfortably in the exam room in no acute distress.   MENTAL STATUS:  Alert and oriented to person, place and time. HEAD:  Wearing a blue head wrap.  Normocephalic, atraumatic, face symmetric, no Cushingoid features. EYES:  Gold rimmed glasses.  Blue eyes.  No conjunctivitis or scleral icterus.  ENT: [place] in place.  Oropharynx clear without lesion. Tongue normal. Mucous membranes moist.  RESPIRATORY: Decreased breath sounds right base with crackles on deep inspiration.  No wheezes or rhonchi. CARDIOVASCULAR: Regular rate and rhythm without murmur, rub or gallop. ABDOMEN: Soft, non-tender with active bowel sounds and no appreciable hepatosplenomegaly. No palpable nodularity or masses. SKIN: No rashes, ulcers or lesions. EXTREMITIES: Bilateral 1+ lower extremity edema.  No skin discoloration or tenderness. No palpable cords. LYMPH NODES: No palpable cervical, supraclavicular, axillary or inguinal adenopathy  NEUROLOGICAL: Appropriate. PSYCH: Appropriate.  Admission on 07/07/2015, Discharged on 07/09/2015  Component Date Value Ref Range Status  . WBC 07/07/2015 5.7  3.6 - 11.0 K/uL Final  . RBC 07/07/2015 3.06* 3.80 - 5.20 MIL/uL Final  . Hemoglobin 07/07/2015 9.2* 12.0 - 16.0 g/dL Final  . HCT 07/07/2015 27.3* 35.0 - 47.0 % Final  . MCV 07/07/2015 89.2  80.0 - 100.0 fL Final  . MCH 07/07/2015 30.0  26.0 - 34.0 pg Final  . MCHC 07/07/2015 33.6  32.0 - 36.0 g/dL Final  . RDW 07/07/2015 16.9* 11.5 - 14.5 %  Final  . Platelets 07/07/2015 271  150 - 440 K/uL Final  . Neutrophils Relative % 07/07/2015 69   Final  . Neutro Abs 07/07/2015 4.0  1.4 - 6.5 K/uL Final  . Lymphocytes Relative 07/07/2015 10   Final  . Lymphs Abs 07/07/2015 0.6* 1.0 - 3.6 K/uL Final  . Monocytes Relative 07/07/2015 19   Final  . Monocytes Absolute 07/07/2015 1.1* 0.2 - 0.9 K/uL Final  .  Eosinophils Relative 07/07/2015 1   Final  . Eosinophils Absolute 07/07/2015 0.0  0 - 0.7 K/uL Final  . Basophils Relative 07/07/2015 1   Final  . Basophils Absolute 07/07/2015 0.0  0 - 0.1 K/uL Final  . Sodium 07/07/2015 135  135 - 145 mmol/L Final  . Potassium 07/07/2015 3.1* 3.5 - 5.1 mmol/L Final  . Chloride 07/07/2015 97* 101 - 111 mmol/L Final  . CO2 07/07/2015 28  22 - 32 mmol/L Final  . Glucose, Bld 07/07/2015 248* 65 - 99 mg/dL Final  . BUN 07/07/2015 14  6 - 20 mg/dL Final  . Creatinine, Ser 07/07/2015 0.74  0.44 - 1.00 mg/dL Final  . Calcium 07/07/2015 7.9* 8.9 - 10.3 mg/dL Final  . GFR calc non Af Amer 07/07/2015 >60  >60 mL/min Final  . GFR calc Af Amer 07/07/2015 >60  >60 mL/min Final   Comment: (NOTE) The eGFR has been calculated using the CKD EPI equation. This calculation has not been validated in all clinical situations. eGFR's persistently <60 mL/min signify possible Chronic Kidney Disease.   . Anion gap 07/07/2015 10  5 - 15 Final  . Sodium 07/08/2015 137  135 - 145 mmol/L Final  . Potassium 07/08/2015 2.8* 3.5 - 5.1 mmol/L Final   Comment: CRITICAL RESULT CALLED TO, READ BACK BY AND VERIFIED WITH SKYLER STONE ON 07/08/15 AT [facility]   . Chloride 07/08/2015 99* 101 - 111 mmol/L Final  . CO2 07/08/2015 26  22 - 32 mmol/L Final  . Glucose, Bld 07/08/2015 210* 65 - 99 mg/dL Final  . BUN 07/08/2015 10  6 - 20 mg/dL Final  . Creatinine, Ser 07/08/2015 0.62  0.44 - 1.00 mg/dL Final  . Calcium 07/08/2015 7.5* 8.9 - 10.3 mg/dL Final  . GFR calc non Af Amer 07/08/2015 >60  >60 mL/min Final  . GFR calc Af Amer  07/08/2015 >60  >60 mL/min Final   Comment: (NOTE) The eGFR has been calculated using the CKD EPI equation. This calculation has not been validated in all clinical situations. eGFR's persistently <60 mL/min signify possible Chronic Kidney Disease.   . Anion gap 07/08/2015 12  5 - 15 Final  . WBC 07/08/2015 4.1  3.6 - 11.0 K/uL Final  . RBC 07/08/2015 3.12* 3.80 - 5.20 MIL/uL Final  . Hemoglobin 07/08/2015 9.5* 12.0 - 16.0 g/dL Final  . HCT 07/08/2015 27.9* 35.0 - 47.0 % Final  . MCV 07/08/2015 89.6  80.0 - 100.0 fL Final  . MCH 07/08/2015 30.6  26.0 - 34.0 pg Final  . MCHC 07/08/2015 34.1  32.0 - 36.0 g/dL Final  . RDW 07/08/2015 17.1* 11.5 - 14.5 % Final  . Platelets 07/08/2015 254  150 - 440 K/uL Final  . Glucose-Capillary 07/07/2015 201* 65 - 99 mg/dL Final  . Glucose-Capillary 07/08/2015 185* 65 - 99 mg/dL Final  . Magnesium 07/08/2015 1.2* 1.7 - 2.4 mg/dL Final  . Glucose-Capillary 07/08/2015 200* 65 - 99 mg/dL Final  . Weight 07/08/2015 2376   Final  . Height 07/08/2015 63   Final  . BP 07/08/2015 112/61   Final  . Rheumatoid Arthritis, Qn/Fluid 07/08/2015 1:80* Neg:<1:10 Final   Comment: (NOTE) Results of this test are for Investigational Purposes Only.  The performance characteristics of this assay have been determined by [facility].  The result should not be used as a diagnostic procedure without confirmation of the diagnosis by another medically established diagnostic product or procedure. Performed At: [facility] [place],  [place] [id number] Lindon Romp MD DJ:[id number]   . Potassium 07/08/2015 3.6  3.5 - 5.1 mmol/L Final  . Magnesium 07/08/2015 2.0  1.7 - 2.4 mg/dL Final  . Total protein, fluid 07/08/2015 3.3   Final   Comment: (NOTE) No normal range established for this test Results should be evaluated in conjunction with serum values   . Fluid Type-FTP 07/08/2015 CYTOPLEU   Final  . Fluid Type-FCT 07/08/2015 CYTOPLEU   Final  .  Color, Fluid 07/08/2015 YELLOW* YELLOW Final  . Appearance, Fluid 07/08/2015 HAZY* CLEAR Final  . WBC, Fluid 07/08/2015 1394   Final  . Neutrophil Count, Fluid 07/08/2015 66   Final  . Lymphs, Fluid 07/08/2015 21   Final  . Monocyte-Macrophage-Serous Fluid 07/08/2015 12   Final  . Eos, Fluid 07/08/2015 1   Final  . Other Cells, Fluid 07/08/2015 0   Final  . pH, Body Fluid 07/08/2015 7.9  Not Estab. Final   Comment: (NOTE) This test was developed and its performance characteristics determined by [facility]. It has not been cleared or approved by the Food and Drug Administration. Performed At: [facility] [place], [place] [id number] Lindon Romp MD OI:[id number]   . Source of Sample 07/08/2015 PENDING   Incomplete  . Sodium 07/09/2015 134* 135 - 145 mmol/L Final  . Potassium 07/09/2015 3.3* 3.5 - 5.1 mmol/L Final  . Chloride 07/09/2015 97* 101 - 111 mmol/L Final  . CO2 07/09/2015 30  22 - 32 mmol/L Final  . Glucose, Bld 07/09/2015 186* 65 - 99 mg/dL Final  . BUN 07/09/2015 7  6 - 20 mg/dL Final  . Creatinine, Ser 07/09/2015 0.59  0.44 - 1.00 mg/dL Final  . Calcium 07/09/2015 7.4* 8.9 - 10.3 mg/dL Final  . GFR calc non Af Amer 07/09/2015 >60  >60 mL/min Final  . GFR calc Af Amer 07/09/2015 >60  >60 mL/min Final   Comment: (NOTE) The eGFR has been calculated using the CKD EPI equation. This calculation has not been validated in all clinical situations. eGFR's persistently <60 mL/min signify possible Chronic Kidney Disease.   . Anion gap 07/09/2015 7  5 - 15 Final  . Magnesium 07/09/2015 1.7  1.7 - 2.4 mg/dL Final  . Phosphorus 07/09/2015 3.0  2.5 - 4.6 mg/dL Final  . Glucose-Capillary 07/08/2015 214* 65 - 99 mg/dL Final  . Glucose-Capillary 07/08/2015 226* 65 - 99 mg/dL Final  . Glucose-Capillary 07/09/2015 163* 65 - 99 mg/dL Final  . Comment 1 07/09/2015 Notify RN   Final  . Glucose-Capillary 07/09/2015 229* 65 - 99 mg/dL Final  . Comment 1 07/09/2015  Notify RN   Final    Assessment:  ARITA SEVERTSON is a 70 y.o. female with clinical stage IIIC (T3cN1Mx) ovarian cancer presenting with abdominal discomfort and bloating.  Omental biopsy on 02/23/2015 revealed metastatic high grade serous carcinoma, consistent with gynecologic origin.  CA125 was 707 on 02/17/2015.  Abdomen and pelvic CT scan on 02/13/2015 revealed bilateral mass-like adnexal regions (right adnexal mass 5.6 x 5.0 cm and the left adnexa mass 10.3 x 6.0 x 9.9 cm).  There was a large amount of soft tissue throughout the peritoneal cavity involving the omentum and other peritoneal surfaces.  There was a small volume ascites. There was a peripheral 3.3 x 1.9 cm low-attenuation lesion overlying the right lobe of the liver , likely representing a serosal implant. There was 1.4 x 2.2 cm ill-defined peripheral lesion within the inferior aspect of segment 6  adjacent to the ampulla in the duodenum.   She has severe rheumatoid arthritis.  Methotrexate and Enbrel are on hold.  She has a normocytic anemia.  Work-up on 02/17/2015 revealed a normal ferritin, B12, folate, TSH.  She denies any melena or hematochezia.  She has diabetes and is on an insulin pump.  She is status post cycle #4 neoadjuvant carboplatin and Taxol (03/05/2015 - 05/22/2015).  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  She had loose stools on oral magnesium.  She was initially on Neurontin then switched Lyrica with cycle #3.  Cycle #4 was notable for neutropenia (ANC 300) requiring GCSF x 3 days.  CA125 was 802.9 on 03/30/2015, 567.9 on 04/13/2015, and 168.8 on 05/15/2015.  Abdominal and pelvic CT scan on 04/28/2015 revealed decreasing bilateral ovarian masses.   The left adnexal mass measured 4.0 x 6.1 cm (previously 5.0 x 8.5 cm). The right ovary measured 3.8 x 4.9 cm (previously 4.7 x 5.6 cm).  There was improved peritoneal carcinomatosis.  There was a small amount of ascites.  The hepatic dome lesion was stable. The  previously seen right hepatic lobe lesion was not well-visualized.  There was a small left pleural effusion and trace right pleural effusion.  The nodular lesion at the ampulla of Vater, extending into the duodenum was stable.  There was no biliary ductal dilatation.  She underwent exploratory laparotomy, lysis of adhesions, total abdominal hysterectomy with bilateral salpingo-oophorectomy, infracolic omentectomy, optimal tumor debulking(< 1 cm), recto-sigmoid resection with creation of end colostomy, cholecystectomy, mobilization of splenic flexure and liver with diaphragmatic stripping on 06/15/2015. The right diaphragm was cleared of tumor. During dissection, the diaphragm was entered and closed with sutures.   She has had a recurrent right sided pleural effusion.  She underwent thoracentesis of 650 cc on post-operative day 3.  She was admitted to [facility] on 07/01/2015 and 07/07/2015 for recurrent shortness of breath.  She has undergone 2 additional thoracentesis (1.1 L on 07/02/2015 and 850 cc on 07/08/2015).  Cytology was negative x 2.  Bilateral lower extremity duplex on 07/03/2015 was negative.  Echo on 07/08/2015 revealed en EF of 55-60%.  She is now on oxygen 2 liter/min via [place].  Symptomatically, she is markedly fatigued.  Performance status is 3.  She has a grade III neuropathy.  Plan: 1.  Discuss recurrent pleural effusion likely secondary to breach of right diaphragm with surgery.  No evidence of malignancy.  Consider Pleur-X catheter if recurs. 2.  Discuss debilitating neuropathy.  Reinstitute Neurontin.  Advance dose as tolerated. 3.  Discuss follow-up with Gyn-Onc next week.  Anticipate reinstitution of chemotherapy when patient able. 4.  RTC in 1 week for MD assessment and labs (CBC with diff, CMP, CA125).   Approximately 40 minutes were spent with the patient reviewing current issues (pleural effusion and neuropathy) and plans to reinstitute chemotherapy in the near future.  Additional  time was spent obtaining data from her hospitalizations and discussing case with Dr. Genevive Bi, thoracic surgeon.   Lequita Asal, MD  07/10/2015, 10:41 AM

## 2015-07-10 NOTE — Telephone Encounter (Signed)
Please advise, thanks.

## 2015-07-10 NOTE — Telephone Encounter (Signed)
Unable to reach patient. Will continue to follow.

## 2015-07-10 NOTE — Telephone Encounter (Signed)
Unable to reach patient.  Will continue to follow as appropriate with transitional care management. 

## 2015-07-13 ENCOUNTER — Telehealth: Payer: Self-pay

## 2015-07-13 LAB — PH, BODY FLUID: PH, BODY FLUID: 7.9

## 2015-07-13 NOTE — Telephone Encounter (Signed)
Transition Care Management Follow-up Telephone Call   Date discharged? 07/09/15   How have you been since you were released from the hospital? Getting stronger with no pain and no shortness of breath.  Using home oxygen at 2L.   Do you understand why you were in the hospital? Yes, fluid build up and shortness of breath.   Do you understand the discharge instructions? Yes, and I am trying to walk more and doing breathing exercises.    Where were you discharged to? Home.   Items Reviewed:  Medications reviewed: Yes, taking all medications without issues.  Allergies reviewed: Yes, no changes.  Dietary changes reviewed: Yes, no problems.  Referrals reviewed: Yes, oncology appointment scheduled.   Functional Questionnaire:   Activities of Daily Living (ADLs):   She states they are independent in the following: Grooming, bathing, toileting, self feeding. States they require assistance with the following: Ambulating (uses walker), meal prep.  Daughter and husband to assist.  Home health to assist.   Any transportation issues/concerns?: Yes, and appointment has been scheduled according to transportation availability.   Any patient concerns? None at this time.   Confirmed importance and date/time of follow-up visits scheduled Yes, appointment scheduled 07/17/15.  Provider Appointment booked with Dr. Gilford Rile (PCP).  Confirmed with patient if condition begins to worsen call PCP or go to the ER.  Patient was given the office number and encouraged to call back with question or concerns.  : Yes, patient verbalized understanding.

## 2015-07-15 ENCOUNTER — Inpatient Hospital Stay: Payer: Medicare Other | Admitting: Obstetrics and Gynecology

## 2015-07-15 VITALS — BP 107/63 | HR 80 | Temp 97.9°F | Resp 20

## 2015-07-15 DIAGNOSIS — C569 Malignant neoplasm of unspecified ovary: Secondary | ICD-10-CM

## 2015-07-15 NOTE — Progress Notes (Signed)
  Oncology Nurse Navigator Documentation  Navigator Location: [facility] (07/15/15 1200) Navigator Encounter Type: [facility] Follow-up (07/15/15 1200)           Patient Visit Type: Post-op Appt (07/15/15 1200) Treatment Phase: Follow-up (07/15/15 1200)                  Acuity: Level 2 (07/15/15 1200)   Acuity Level 2: Initial guidance, education and coordination as needed;Educational needs;Ongoing guidance and education throughout treatment as needed (07/15/15 1200)     Time Spent with Patient: 30 (07/15/15 1200)   Assisted with pelvic exam

## 2015-07-15 NOTE — Progress Notes (Signed)
Gynecologic Oncology Consult Visit   Referring Provider: Lequita Asal, MD  Additional Care Provider: Dr. Harvest Dark.  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    Subjective:  [name] is a 70 y.o. 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  On 06/15/2015 she underwent diagnostic laparoscopy, exploratory laparotomy, lysis of adhesions (including enterolysis for > 45 minutes), total abdominal hysterectomy with bilateral salpingo-oophorectomy, infracolic omentectomy, optimal tumor debulking, recto-sigmoid resection with creation of end colostomy, mobilization of the splenic flexure  mobilization of the liver with diaphragmatic stripping and resection of diaphragm due to disease, repair of diaphragmatic defect and cholecystectomy. Her postoperative course was unremarkable.   She developed dyspnea and was admitted twice to [facility] for thoracenteses most recently went home 5 days ago.  Fluid was negative for cancer. Sent home with nasal O2 and Lasix.     She complains of fatigue, weakness, leg swelling (much better), and peripheral neuropathy.   PATHOLOGY: DIAGNOSIS  A. Uterus, bilateral ovaries and fallopian tubes, hysterectomy and bilateral salpingo-oophorectomy:  Left ovary: Residual high grade serous carcinoma, 2.5 cm. Right ovary: Residual high grade serous carcinoma, 1.5 cm.  Right and left fallopian tubes, free of tumor. See comment.  Uterus:  Residual high grade serous carcinoma involving uterine serosa and myometrium. Endometrium: Atrophic endometrium Cervix: No pathologic diagnosis.   See synoptic report for additional details.  Comment: Immunostains performed on block A17 ( right tube) show a small segment of p53 positive tubal epithelium. However, this focus of  epithelium is cytological unremarkable and shows no increase in Ki-67 labelling or P16 staining. It is therefore classified as a p53 signature lesion rather than a serous tubal intraepithelial carcinoma.    B. Rectomsigmoid colon and partial left ovary, resection:   Residual high grade serous carcinoma involving colonic serosa and subserosa, fallopian tube and ovary.   C. Omentum, omentectomy:   Residual high grade serous carcinoma, tumor size > 2 cm.   D. Perihepatic tumor, excision:  Residual high grade serous carcinoma.   E. Gallbladder, cholecystectomy:  Chronic cholecystitis and cholelithiasis. Negative for malignancy.   F. Diaphragm, excision:  Residual high grade serous carcinoma, > 2 cm  G. Omentum #2, excision:   Residual high grade serous carcinoma.    HPI: The patient presented to the emergency room at [facility] on 02/13/2015 with a 2-3 week history of diffuse abdominal discomfort and bloating.    Abdomen and pelvic CT scan on 02/13/2015 revealed bilateral mass-like adnexal regions. The right adnexal region measured approximately 5.6 x 5.0 cm posterior to the uterus, and the left adnexa demonstrated a large mass measuring approximately 10.3 x 6.0 x 9.9 cm, with heterogeneous internal areas of enhancement and cystic change. This appeared intimately associated with the adjacent proximal sigmoid colon. There was a large amount of soft tissue throughout the peritoneal cavity involving the omentum and other peritoneal surfaces, compatible with widespread intraperitoneal metastatic disease. There was a small volume of presumably malignant ascites. There was a peripheral low-attenuation lesion overlying the right lobe of the liver measuring approximately 3.3 x 1.9 cm, favored to represent a serosal implant. There was a similar appearing 1.4 x 2.2 cm ill-defined peripheral lesion within the inferior aspect of segment 6 adjacent to the ampulla in the duodenum.  There was no lytic or blastic lesions. She had a chronic T12 compression fracture.  She was seen in consultation by Dr Theora Gianotti from Dr. Mike Gip for bilateral ovarian masses with associated peritoneal metastatic disease.   02/23/2015 CT biopsy high grade serous carcinoma.  Decision was made for neoadjuvant chemotherapy with carboplatin/taxol. She has had 4 cycles of carboplatin (AUC 5) /taxol (175 mg per sq M) with Dr Mike Gip, last 12/19.  CA125 dropped from 707 in 10/16 to 168.8 in 05/15/2015.   She stopped MTX and Enbarel she had been taking for rheumatoid arthritis while on chemotherapy and joint symptoms have been in good control.  Uses Prednisone 73m qd prn and has taken this the last three days.   CT scan 04/27/16 Heterogeneous left adnexal mass measures approximately 4.0 x 6.1 cm, previously approximately 5.0 x 8.5 cm when remeasured. Heterogeneous right ovary measures approximately 3.8 x 4.9 cm, roughly 4.7 x 5.6 cm on the prior exam. Uterus is visualized. Other: Omental caking has improved in the interval but has not fully resolved. Residual scattered areas of omental nodularity and thickening persist. Small ascites, decreased.   Hepatobiliary: Low-attenuation lesion in the periphery of the hepatic dome measures 3.1 cm, stable. A lesion previously seen off the posterior right hepatic lobe is not readily visualized. There is a small amount of fluid in Morison's pouch. Numerous stones are seen in the gallbladder. No biliary ductal dilatation.  IMPRESSION: 1. Interval response to therapy as evidenced by decrease in size of bilateral ovarian masses and improved, but not resolved, peritoneal carcinomatosis. 2. Hepatic dome lesion is unchanged. Previously seen right hepatic lobe lesion is not well-visualized. 3. Small ascites and small left pleural effusion. Trace right pleural effusion. 4. Cholelithiasis. 5. Nodular lesion at the ampulla of Vater, extending into the duodenum, stable.    Problem List: Patient Active Problem List   Diagnosis Date Noted  . Recurrent pleural effusion on right 07/01/2015  . Decubitus ulcer 05/19/2015  . Hyponatremia 05/15/2015  . Neuropathy ([place] 05/15/2015  . Ovarian cancer ([place] 05/06/2015  . Bilateral lower extremity edema 03/30/2015  . Hypomagnesemia 03/05/2015  . Diarrhea 02/26/2015  . Peritoneal carcinomatosis ([place] 02/17/2015  . Anemia 02/17/2015  . Adnexal mass 02/13/2015  . Angina pectoris associated with type 2 diabetes mellitus ([place] 02/10/2015  . History of esophageal stricture 07/08/2014  . Dysphagia, pharyngoesophageal phase 07/08/2014  . Candida esophagitis ([place] 05/15/2014  . Cataracts, bilateral 01/13/2014  . Medicare annual wellness visit, subsequent 09/20/2013  . CAD (coronary artery disease) of artery bypass graft 11/05/2012  . Anemia, iron deficiency 10/09/2012  . CAD (coronary artery disease) 10/01/2012  . Hearing loss 07/06/2011  . Diabetes type 1, controlled ([place] 07/06/2011  . Gastroesophageal reflux disease with stricture 07/06/2011  . GERD (gastroesophageal reflux disease) 04/05/2011  . Osteoporosis, unspecified 04/05/2011  . Hypertension 01/10/2011  . Hyperlipidemia 01/10/2011  . RA (rheumatoid arthritis) ([place] 01/10/2011   Past Medical History: Past Medical History  Diagnosis Date  . Leukopenia 2012    s/p bone marrow biopsy, Dr. PMa Hillock . Cholelithiasis   . GERD (gastroesophageal reflux disease)   . Hypertension   . Hyperlipidemia   . Esophageal stricture   . Chronic diastolic CHF (congestive heart failure) ([place]   . CAD (coronary artery disease)     a. 09/2012 Cath: LM nl, LAD 95p, 765mLCX 9049mM2 50, RCA 100.  . HMarland Kitchenpokalemia   . Herniated disc   . Pneumonia 2013; 08/2012    "one lung; double" (10/18/2012)  . Exertional shortness of breath   . Type I diabetes mellitus ([place]   "  dx'd in 1957" (10/18/2012)  . History of pancytopenia   . H/O hiatal hernia   . Rheumatoid arthritis(714.0)    . NSTEMI (non-ST elevated myocardial infarction) ([place]) 08/2012    "mild" (10/18/2012)  . Collagen vascular disease ([place])   . Ovarian cancer [facility])     Past Surgical History: Past Surgical History  Procedure Laterality Date  . Esophagogastroduodenoscopy  2012    Dr. Minna Merritts  . Tubal ligation  1970  . Cataract extraction w/ intraocular lens implant Right 2010  . Cardiac catheterization  10/18/2012    "first one was today" (10/18/2012)  . Esophageal dilation      "3 or 4 times" (10/19/2011)  . Coronary artery bypass graft N/A 10/19/2012    Procedure: CORONARY ARTERY BYPASS GRAFTING (CABG);  Surgeon: Melrose Nakayama, MD;  Location: [facility];  Service: Open Heart Surgery;  Laterality: N/A;  . Peripheral vascular catheterization N/A 03/02/2015    Procedure: Porta Cath Insertion;  Surgeon: Algernon Huxley, MD;  Location: [place] [facility];  Service: Cardiovascular;  Laterality: N/A;  . Abdominal hysterectomy  06/15/2015    Dx L/S, EXLAP TAH BSO omentectomy RSRx colostomy diaphragm resection stripping  . Cholecystectomy  06/15/2015    combined case with ovarian cancer debulking  . Colon surgery    . Ostomy      Past Gynecologic History:  Postmenopausal She has not had a GYN evaluation since age 30.    OB History:  G3P3  1. SVD - neonatal death 2. SVD - singleton 3. SVD - twins  Family History: Family History  Problem Relation Age of Onset  . Diabetes Mother   . Arthritis Mother   . Diabetes Father   . Arthritis Father   . Bone cancer Sister     Social History: Social History   Social History  . Marital Status: Married    Spouse Name: N/A  . Number of Children: 3  . Years of Education: N/A   Occupational History  . Retired    Social History Main Topics  . Smoking status: Former Smoker -- 1.00 packs/day for 30 years    Types: Cigarettes    Quit date: 06/11/1994  . Smokeless tobacco: Never Used  . Alcohol Use: No  . Drug Use: No  . Sexual Activity: No   Other  Topics Concern  . Not on file   Social History Narrative    Allergies: Allergies  Allergen Reactions  . Codeine Nausea And Vomiting    Current Medications: Current Outpatient Prescriptions  Medication Sig Dispense Refill  . amLODipine (NORVASC) 5 MG tablet Take 5 mg by mouth daily.    Marland Kitchen atorvastatin (LIPITOR) 80 MG tablet Take 80 mg by mouth every evening.    . enoxaparin (LOVENOX) 40 MG/0.4ML injection Inject 40 mg into the skin daily.     . furosemide (LASIX) 40 MG tablet Take 1 tablet (40 mg total) by mouth 2 (two) times daily. 180 tablet 3  . gabapentin (NEURONTIN) 300 MG capsule Take 1 capsule (300 mg total) by mouth 3 (three) times daily. (Patient taking differently: Take 300 mg by mouth 4 (four) times daily. ) 90 capsule 1  . ibuprofen (ADVIL,MOTRIN) 800 MG tablet Take 800 mg by mouth 3 (three) times daily as needed for mild pain.    . Insulin Human (INSULIN PUMP) SOLN Pt uses Humalog insulin.    Marland Kitchen lidocaine-prilocaine (EMLA) cream Apply 1 application topically as needed (prior to accessing port).    . loratadine (  CLARITIN) 10 MG tablet Take 10 mg by mouth every evening.     Marland Kitchen losartan (COZAAR) 100 MG tablet Take 1 tablet (100 mg total) by mouth daily. 90 tablet 3  . metoprolol tartrate (LOPRESSOR) 25 MG tablet Take 25 mg by mouth 2 (two) times daily.    . Multiple Vitamin (MULTIVITAMIN WITH MINERALS) TABS tablet Take 1 tablet by mouth daily.    . ondansetron (ZOFRAN) 8 MG tablet Take 1 tablet (8 mg total) by mouth every 8 (eight) hours as needed for nausea or vomiting. 20 tablet 1  . potassium chloride SA (K-DUR,KLOR-CON) 20 MEQ tablet Take 20 mEq by mouth 2 (two) times daily.    . ranitidine (ZANTAC) 150 MG tablet Take 1 tablet (150 mg total) by mouth 2 (two) times daily. 180 tablet 2  . zoledronic acid (RECLAST) 5 MG/100ML SOLN Inject 5 mg into the vein See admin instructions. Pt gets an injection once per year.     No current facility-administered medications for this  visit.   Facility-Administered Medications Ordered in Other Visits  Medication Dose Route Frequency Provider Last Rate Last Dose  . sodium chloride 0.9 % injection 10 mL  10 mL Intracatheter PRN Lequita Asal, MD      . sodium chloride 0.9 % injection 10 mL  10 mL Intravenous PRN Lequita Asal, MD   10 mL at 04/13/15 0938    Review of Systems General: fatigue and weakness Skin: negative for changes in color, texture, moles or lesions HEENT: negative for, change in hearing, pain, discharge, tinnitus, vertigo, voice changes, sore throat, neck masses Pulmonary: dyspnea increasing over 5 days Cardiac: negative for pain Gastrointestinal: distension but no n/v and ostomy is functioning well Genitourinary/Sexual: negative for, dysuria, hesitancy, nocturia, incontinence Ob/Gyn: negative for, irregular bleeding, pain Musculoskeletal: swelling feet for a couple of weeks Hematology: negative for, easy bruising, bleeding Neuro: peripheral neuropathy symptoms  Objective:  Physical Examination:  BP 107/63 mmHg  Pulse 80  Temp(Src) 97.9 F (36.6 C) (Tympanic)  Resp 20  There is no weight on file to calculate BMI.  ECOG Performance Status: 2 - Symptomatic, <50% confined to bed  General appearance: alert, cooperative and appears stated age, pale HEENT:PERRLA, extra ocular movement intact and sclera clear, anicteric, mucous membranes moist Lymph node survey: non-palpable, axillary, inguinal, supraclavicular Cardiovascular: regular rate and rhythm Respiratory: normal air entry, lungs clear to auscultation but decreased breath sounds right base Abdomen: soft, nondistended and nontendder, no mass/ascites/hernias. Ostomy bag intact.  Incision well healed. Back: inspection of back is normal Extremities: extremities atraumatic, no cyanosis but bilateral 3+ edema Neurological exam reveals alert, oriented, normal speech, no focal findings or movement disorder noted.  Pelvic: EGBUS/Vagina:  normal, Bimanual: no masses.   Lab Review Lab Results  Component Value Date   CA125 168.8* 05/15/2015     Lab Results  Component Value Date   WBC 4.1 07/08/2015   HGB 9.5* 07/08/2015   HCT 27.9* 07/08/2015   MCV 89.6 07/08/2015   PLT 254 07/08/2015     Chemistry      Component Value Date/Time   NA 134* 07/09/2015 0614   NA 134* 09/27/2012 0459   K 3.3* 07/09/2015 0614   K 4.5 09/27/2012 0459   CL 97* 07/09/2015 0614   CL 100 09/27/2012 0459   CO2 30 07/09/2015 0614   CO2 28 09/27/2012 0459   BUN 7 07/09/2015 0614   BUN 8 09/27/2012 0459   CREATININE 0.59 07/09/2015 1829  CREATININE 0.60 09/27/2012 0459      Component Value Date/Time   CALCIUM 7.4* 07/09/2015 0614   CALCIUM 8.5 09/27/2012 0459   ALKPHOS 59 05/21/2015 0839   ALKPHOS 100 09/21/2012 1210   AST 20 05/21/2015 0839   AST 36 09/21/2012 1210   ALT 20 05/21/2015 0839   ALT 23 09/21/2012 1210   BILITOT 0.5 05/21/2015 0839   BILITOT 0.7 09/21/2012 1210         Assessment:  [name] is a 70 y.o. 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   Dyspnea due to pleural effusions, drained twice and on home O2, but no further dyspnea.  Bilateral lower extremity swelling most likely secondary to fluid overload, and now better with lasix.    Medical co-morbidities complicating care: HTN, insulin dependent diabetes with SQ  infusion pump, CAD, rheumatoid arthritis, asymptomatic gall stones.   Plan:   Problem List Items Addressed This Visit      Genitourinary   Ovarian cancer ([place]) - Primary     She will follow up with Dr Mike Gip later this week.  At that time she can be evaluated to see if she can discontinue supplemental oxygen.  The right pleural effusion was likely related to the diaphragm resection done at the time of her surgery. This should resolve without further intervention, but if not a chest tube and sclerosis would likely be the next step.   Hopefully, she can soon resume carbo/taxol chemo for an additional 2-4 cycles depending on CA125 trend.  We can see her back in 3 months.   Mellody Drown, MD  CC:   Referring Provider: Lequita Asal, MD  Additional Care Provider: Dr. Harvest Dark. Dr. Percell Boston

## 2015-07-17 ENCOUNTER — Telehealth: Payer: Self-pay

## 2015-07-17 ENCOUNTER — Inpatient Hospital Stay: Payer: Medicare Other

## 2015-07-17 ENCOUNTER — Encounter: Payer: Self-pay | Admitting: Hematology and Oncology

## 2015-07-17 ENCOUNTER — Ambulatory Visit (INDEPENDENT_AMBULATORY_CARE_PROVIDER_SITE_OTHER): Payer: Medicare Other | Admitting: Internal Medicine

## 2015-07-17 ENCOUNTER — Inpatient Hospital Stay (HOSPITAL_BASED_OUTPATIENT_CLINIC_OR_DEPARTMENT_OTHER): Payer: Medicare Other | Admitting: Hematology and Oncology

## 2015-07-17 ENCOUNTER — Other Ambulatory Visit: Payer: Self-pay

## 2015-07-17 ENCOUNTER — Encounter: Payer: Self-pay | Admitting: Internal Medicine

## 2015-07-17 VITALS — BP 122/58 | HR 83 | Temp 98.5°F | Wt 152.5 lb

## 2015-07-17 VITALS — BP 112/55 | HR 85 | Resp 18 | Ht 63.0 in | Wt 152.8 lb

## 2015-07-17 DIAGNOSIS — I252 Old myocardial infarction: Secondary | ICD-10-CM

## 2015-07-17 DIAGNOSIS — R0602 Shortness of breath: Secondary | ICD-10-CM

## 2015-07-17 DIAGNOSIS — R110 Nausea: Secondary | ICD-10-CM

## 2015-07-17 DIAGNOSIS — C569 Malignant neoplasm of unspecified ovary: Secondary | ICD-10-CM

## 2015-07-17 DIAGNOSIS — M069 Rheumatoid arthritis, unspecified: Secondary | ICD-10-CM

## 2015-07-17 DIAGNOSIS — E109 Type 1 diabetes mellitus without complications: Secondary | ICD-10-CM

## 2015-07-17 DIAGNOSIS — Z87891 Personal history of nicotine dependence: Secondary | ICD-10-CM

## 2015-07-17 DIAGNOSIS — K219 Gastro-esophageal reflux disease without esophagitis: Secondary | ICD-10-CM

## 2015-07-17 DIAGNOSIS — Z79899 Other long term (current) drug therapy: Secondary | ICD-10-CM

## 2015-07-17 DIAGNOSIS — I10 Essential (primary) hypertension: Secondary | ICD-10-CM

## 2015-07-17 DIAGNOSIS — J90 Pleural effusion, not elsewhere classified: Secondary | ICD-10-CM | POA: Diagnosis not present

## 2015-07-17 DIAGNOSIS — E1059 Type 1 diabetes mellitus with other circulatory complications: Secondary | ICD-10-CM | POA: Diagnosis not present

## 2015-07-17 DIAGNOSIS — Z90710 Acquired absence of both cervix and uterus: Secondary | ICD-10-CM

## 2015-07-17 DIAGNOSIS — R5383 Other fatigue: Secondary | ICD-10-CM

## 2015-07-17 DIAGNOSIS — K449 Diaphragmatic hernia without obstruction or gangrene: Secondary | ICD-10-CM

## 2015-07-17 DIAGNOSIS — C786 Secondary malignant neoplasm of retroperitoneum and peritoneum: Secondary | ICD-10-CM | POA: Diagnosis not present

## 2015-07-17 DIAGNOSIS — R0600 Dyspnea, unspecified: Secondary | ICD-10-CM

## 2015-07-17 DIAGNOSIS — E785 Hyperlipidemia, unspecified: Secondary | ICD-10-CM

## 2015-07-17 DIAGNOSIS — I5032 Chronic diastolic (congestive) heart failure: Secondary | ICD-10-CM

## 2015-07-17 DIAGNOSIS — Z90722 Acquired absence of ovaries, bilateral: Secondary | ICD-10-CM

## 2015-07-17 DIAGNOSIS — R630 Anorexia: Secondary | ICD-10-CM

## 2015-07-17 DIAGNOSIS — G629 Polyneuropathy, unspecified: Secondary | ICD-10-CM | POA: Diagnosis not present

## 2015-07-17 DIAGNOSIS — E876 Hypokalemia: Secondary | ICD-10-CM

## 2015-07-17 DIAGNOSIS — M7989 Other specified soft tissue disorders: Secondary | ICD-10-CM

## 2015-07-17 DIAGNOSIS — I2510 Atherosclerotic heart disease of native coronary artery without angina pectoris: Secondary | ICD-10-CM

## 2015-07-17 DIAGNOSIS — Z794 Long term (current) use of insulin: Secondary | ICD-10-CM

## 2015-07-17 LAB — CBC WITH DIFFERENTIAL/PLATELET
Basophils Absolute: 0 10*3/uL (ref 0–0.1)
Basophils Relative: 1 %
Eosinophils Absolute: 0 10*3/uL (ref 0–0.7)
Eosinophils Relative: 0 %
HCT: 26.3 % — ABNORMAL LOW (ref 35.0–47.0)
Hemoglobin: 8.9 g/dL — ABNORMAL LOW (ref 12.0–16.0)
Lymphocytes Relative: 10 %
Lymphs Abs: 0.5 10*3/uL — ABNORMAL LOW (ref 1.0–3.6)
MCH: 29.7 pg (ref 26.0–34.0)
MCHC: 33.8 g/dL (ref 32.0–36.0)
MCV: 87.9 fL (ref 80.0–100.0)
Monocytes Absolute: 0.7 10*3/uL (ref 0.2–0.9)
Monocytes Relative: 13 %
Neutro Abs: 4.3 10*3/uL (ref 1.4–6.5)
Neutrophils Relative %: 76 %
Platelets: 311 10*3/uL (ref 150–440)
RBC: 3 MIL/uL — ABNORMAL LOW (ref 3.80–5.20)
RDW: 16.7 % — ABNORMAL HIGH (ref 11.5–14.5)
WBC: 5.6 10*3/uL (ref 3.6–11.0)

## 2015-07-17 LAB — COMPREHENSIVE METABOLIC PANEL
ALT: 26 U/L (ref 14–54)
AST: 47 U/L — ABNORMAL HIGH (ref 15–41)
Albumin: 2.1 g/dL — ABNORMAL LOW (ref 3.5–5.0)
Alkaline Phosphatase: 214 U/L — ABNORMAL HIGH (ref 38–126)
Anion gap: 8 (ref 5–15)
BUN: 15 mg/dL (ref 6–20)
CO2: 24 mmol/L (ref 22–32)
Calcium: 8 mg/dL — ABNORMAL LOW (ref 8.9–10.3)
Chloride: 96 mmol/L — ABNORMAL LOW (ref 101–111)
Creatinine, Ser: 0.82 mg/dL (ref 0.44–1.00)
GFR calc Af Amer: >60 mL/min (ref >60)
GFR calc non Af Amer: >60 mL/min (ref >60)
Glucose, Bld: 351 mg/dL — ABNORMAL HIGH (ref 65–99)
Potassium: 4.4 mmol/L (ref 3.5–5.1)
Sodium: 128 mmol/L — ABNORMAL LOW (ref 135–145)
Total Bilirubin: 0.5 mg/dL (ref 0.3–1.2)
Total Protein: 7.3 g/dL (ref 6.5–8.1)

## 2015-07-17 NOTE — Progress Notes (Signed)
Pre visit review using our clinic review tool, if applicable. No additional management support is needed unless otherwise documented below in the visit note. 

## 2015-07-17 NOTE — Assessment & Plan Note (Signed)
Will hold Atorvastatin.

## 2015-07-17 NOTE — Assessment & Plan Note (Signed)
Follow up with oncology pending today.

## 2015-07-17 NOTE — Assessment & Plan Note (Signed)
Dyspnea improving. Follow up CXR pending today. Continues on supplemental oxygen. Will folllow after oncology visit/.

## 2015-07-17 NOTE — Telephone Encounter (Signed)
Called pt per MD to discuss low sodium levels.  Discussed increasing fluids with electrolytes without sugar and adding sodium to her diet.  Pt verbalized an understanding.

## 2015-07-17 NOTE — Assessment & Plan Note (Signed)
BG elevated. On insulin pump. She has follow up with endocrinology pending Monday.

## 2015-07-17 NOTE — Patient Instructions (Signed)
Stop Atorvastatin.  Stop Multivitamin.  Talk with Oncology about stopping Gabapentin.  Follow up in 4 weeks and sooner as needed.

## 2015-07-17 NOTE — Patient Instructions (Signed)
Doxorubicin Liposomal injection  What is this medicine?  LIPOSOMAL DOXORUBICIN (LIP oh som al dox oh ROO bi sin) is a chemotherapy drug. This medicine is used to treat many kinds of cancer like Kaposi's sarcoma, multiple myeloma, and ovarian cancer.  This medicine may be used for other purposes; ask your health care provider or pharmacist if you have questions.  What should I tell my health care provider before I take this medicine?  They need to know if you have any of these conditions:  -blood disorders  -heart disease  -infection (especially a virus infection such as chickenpox, cold sores, or herpes)  -liver disease  -recent or ongoing radiation therapy  -an unusual or allergic reaction to doxorubicin, other chemotherapy agents, soybeans, other medicines, foods, dyes, or preservatives  -pregnant or trying to get pregnant  -breast-feeding  How should I use this medicine?  This drug is given as an infusion into a vein. It is administered in a hospital or clinic by a specially trained health care professional. If you have pain, swelling, burning or any unusual feeling around the site of your injection, tell your health care professional right away.  Talk to your pediatrician regarding the use of this medicine in children. Special care may be needed.  Overdosage: If you think you have taken too much of this medicine contact a poison control center or emergency room at once.  NOTE: This medicine is only for you. Do not share this medicine with others.  What if I miss a dose?  It is important not to miss your dose. Call your doctor or health care professional if you are unable to keep an appointment.  What may interact with this medicine?  Do not take this medicine with any of the following medications:  -zidovudine  This medicine may also interact with the following medications:  -medicines to increase blood counts like filgrastim, pegfilgrastim, sargramostim  -vaccines  Talk to your doctor or health care  professional before taking any of these medicines:  -acetaminophen  -aspirin  -ibuprofen  -ketoprofen  -naproxen  This list may not describe all possible interactions. Give your health care provider a list of all the medicines, herbs, non-prescription drugs, or dietary supplements you use. Also tell them if you smoke, drink alcohol, or use illegal drugs. Some items may interact with your medicine.  What should I watch for while using this medicine?  Your condition will be monitored carefully while you are receiving this medicine. You will need important blood work done while you are taking this medicine.  This drug may make you feel generally unwell. This is not uncommon, as chemotherapy can affect healthy cells as well as cancer cells. Report any side effects. Continue your course of treatment even though you feel ill unless your doctor tells you to stop.  Your urine may turn orange-red for a few days after your dose. This is not blood. If your urine is dark or brown, call your doctor.  In some cases, you may be given additional medicines to help with side effects. Follow all directions for their use.  Call your doctor or health care professional for advice if you get a fever (100.5 degrees F or higher), chills or sore throat, or other symptoms of a cold or flu. Do not treat yourself. This drug decreases your body's ability to fight infections. Try to avoid being around people who are sick.  This medicine may increase your risk to bruise or bleed. Call your doctor or   you are receiving this medicine. Avoid taking products that contain aspirin, acetaminophen, ibuprofen, naproxen, or ketoprofen unless instructed by your doctor. These medicines may hide a fever. Men and women of  childbearing age should use effective birth control methods while using taking this medicine. Do not become pregnant while taking this medicine. There is a potential for serious side effects to an unborn child. Talk to your health care professional or pharmacist for more information. Do not breast-feed an infant while taking this medicine. Talk to your doctor about your risk of cancer. You may be more at risk for certain types of cancers if you take this medicine. What side effects may I notice from receiving this medicine? Side effects that you should report to your doctor or health care professional as soon as possible: -allergic reactions like skin rash, itching or hives, swelling of the face, lips, or tongue -low blood counts - this medicine may decrease the number of white blood cells, red blood cells and platelets. You may be at increased risk for infections and bleeding. -signs of hand-foot syndrome - tingling or burning, redness, flaking, swelling, small blisters, or small sores on the palms of your hands or the soles of your feet -signs of infection - fever or chills, cough, sore throat, pain or difficulty passing urine -signs of decreased platelets or bleeding - bruising, pinpoint red spots on the skin, black, tarry stools, blood in the urine -signs of decreased red blood cells - unusually weak or tired, fainting spells, lightheadedness -back pain, chills, facial flushing, fever, headache, tightness in the chest or throat during the infusion -breathing problems -chest pain -fast, irregular heartbeat -mouth pain, redness, sores -pain, swelling, redness at site where injected -pain, tingling, numbness in the hands or feet -swelling of ankles, feet, or hands -vomiting Side effects that usually do not require medical attention (report to your doctor or health care professional if they continue or are bothersome): -diarrhea -hair loss -loss of appetite -nail discoloration or  damage -nausea -red or watery eyes -red colored urine -stomach upset This list may not describe all possible side effects. Call your doctor for medical advice about side effects. You may report side effects to FDA at 1-800-FDA-1088. Where should I keep my medicine? This drug is given in a hospital or clinic and will not be stored at home. NOTE: This sheet is a summary. It may not cover all possible information. If you have questions about this medicine, talk to your doctor, pharmacist, or health care provider.    2016, Elsevier/Gold Standard. (2012-01-06 10:12:56) Gemcitabine injection What is this medicine? GEMCITABINE (jem SIT a been) is a chemotherapy drug. This medicine is used to treat many types of cancer like breast cancer, lung cancer, pancreatic cancer, and ovarian cancer. This medicine may be used for other purposes; ask your health care provider or pharmacist if you have questions. What should I tell my health care provider before I take this medicine? They need to know if you have any of these conditions: -blood disorders -infection -kidney disease -liver disease -recent or ongoing radiation therapy -an unusual or allergic reaction to gemcitabine, other chemotherapy, other medicines, foods, dyes, or preservatives -pregnant or trying to get pregnant -breast-feeding How should I use this medicine? This drug is given as an infusion into a vein. It is administered in a hospital or clinic by a specially trained health care professional. Talk to your pediatrician regarding the use of this medicine in children. Special care may be needed. Overdosage: If  you think you have taken too much of this medicine contact a poison control center or emergency room at once. NOTE: This medicine is only for you. Do not share this medicine with others. What if I miss a dose? It is important not to miss your dose. Call your doctor or health care professional if you are unable to keep an  appointment. What may interact with this medicine? -medicines to increase blood counts like filgrastim, pegfilgrastim, sargramostim -some other chemotherapy drugs like cisplatin -vaccines Talk to your doctor or health care professional before taking any of these medicines: -acetaminophen -aspirin -ibuprofen -ketoprofen -naproxen This list may not describe all possible interactions. Give your health care provider a list of all the medicines, herbs, non-prescription drugs, or dietary supplements you use. Also tell them if you smoke, drink alcohol, or use illegal drugs. Some items may interact with your medicine. What should I watch for while using this medicine? Visit your doctor for checks on your progress. This drug may make you feel generally unwell. This is not uncommon, as chemotherapy can affect healthy cells as well as cancer cells. Report any side effects. Continue your course of treatment even though you feel ill unless your doctor tells you to stop. In some cases, you may be given additional medicines to help with side effects. Follow all directions for their use. Call your doctor or health care professional for advice if you get a fever, chills or sore throat, or other symptoms of a cold or flu. Do not treat yourself. This drug decreases your body's ability to fight infections. Try to avoid being around people who are sick. This medicine may increase your risk to bruise or bleed. Call your doctor or health care professional if you notice any unusual bleeding. Be careful brushing and flossing your teeth or using a toothpick because you may get an infection or bleed more easily. If you have any dental work done, tell your dentist you are receiving this medicine. Avoid taking products that contain aspirin, acetaminophen, ibuprofen, naproxen, or ketoprofen unless instructed by your doctor. These medicines may hide a fever. Women should inform their doctor if they wish to become pregnant or  think they might be pregnant. There is a potential for serious side effects to an unborn child. Talk to your health care professional or pharmacist for more information. Do not breast-feed an infant while taking this medicine. What side effects may I notice from receiving this medicine? Side effects that you should report to your doctor or health care professional as soon as possible: -allergic reactions like skin rash, itching or hives, swelling of the face, lips, or tongue -low blood counts - this medicine may decrease the number of white blood cells, red blood cells and platelets. You may be at increased risk for infections and bleeding. -signs of infection - fever or chills, cough, sore throat, pain or difficulty passing urine -signs of decreased platelets or bleeding - bruising, pinpoint red spots on the skin, black, tarry stools, blood in the urine -signs of decreased red blood cells - unusually weak or tired, fainting spells, lightheadedness -breathing problems -chest pain -mouth sores -nausea and vomiting -pain, swelling, redness at site where injected -pain, tingling, numbness in the hands or feet -stomach pain -swelling of ankles, feet, hands -unusual bleeding Side effects that usually do not require medical attention (report to your doctor or health care professional if they continue or are bothersome): -constipation -diarrhea -hair loss -loss of appetite -stomach upset This list may  not describe all possible side effects. Call your doctor for medical advice about side effects. You may report side effects to FDA at 1-800-FDA-1088. Where should I keep my medicine? This drug is given in a hospital or clinic and will not be stored at home. NOTE: This sheet is a summary. It may not cover all possible information. If you have questions about this medicine, talk to your doctor, pharmacist, or health care provider.    2016, Elsevier/Gold Standard. (2007-08-28 18:45:54)

## 2015-07-17 NOTE — Progress Notes (Addendum)
Subjective:    Patient ID: [name], female    DOB: 1945-11-28, 70 y.o.   MRN: [id number]  HPI  70YO female presents for follow up.  ADMISSION: 07/07/2015 DISCHARGE: 07/09/2015  DIAGNOSIS: Pleural effusion, hypoxia  Admitted with pleural effusion and hypoxia. Had thoracentesis. Cytology was negative.Discharged home with supplemental oxygen.  Chemotherapy has been held until acute issues resolved.  Continues to feel fatigued. Dyspnea improved.  Has follow up with Dr. Mike Gip this afternoon with repeat CXR.  DM - BG have been over 300. Has follow up with endocrine Monday.  Husband is concerned that multiple medications are contributing to fatigue. Would like to stop some medication.  She continues to have severe pain and numbness in bilateral hands, despite use of Neurontin. Planning to ask Dr. Patsy [place] about tapering.  She would like to start back on Chemotherapy for ovarian cancer.   Wt Readings from Last 3 Encounters:  07/17/15 152 lb 8 oz (69.174 kg)  07/10/15 149 lb 4 oz (67.7 kg)  07/07/15 148 lb 8 oz (67.359 kg)   BP Readings from Last 3 Encounters:  07/17/15 122/58  07/15/15 107/63  07/10/15 116/51    Past Medical History  Diagnosis Date  . Leukopenia 2012    s/p bone marrow biopsy, Dr. Ma Hillock  . Cholelithiasis   . GERD (gastroesophageal reflux disease)   . Hypertension   . Hyperlipidemia   . Esophageal stricture   . Chronic diastolic CHF (congestive heart failure) ([place])   . CAD (coronary artery disease)     a. 09/2012 Cath: LM nl, LAD 95p, 34m LCX 967mOM2 50, RCA 100.  . Marland Kitchenypokalemia   . Herniated disc   . Pneumonia 2013; 08/2012    "one lung; double" (10/18/2012)  . Exertional shortness of breath   . Type I diabetes mellitus ([facility]    "dx'd in 1957" (10/18/2012)  . History of pancytopenia   . H/O hiatal hernia   . Rheumatoid arthritis(714.0)   . NSTEMI (non-ST elevated myocardial infarction) ([facility]/2014    "mild" (10/18/2012)  . Collagen vascular  disease ([facility]  . Ovarian cancer ([facility]   Family History  Problem Relation Age of Onset  . Diabetes Mother   . Arthritis Mother   . Diabetes Father   . Arthritis Father   . Bone cancer Sister    Past Surgical History  Procedure Laterality Date  . Esophagogastroduodenoscopy  2012    Dr. IfMinna Merritts. Tubal ligation  1970  . Cataract extraction w/ intraocular lens implant Right 2010  . Cardiac catheterization  10/18/2012    "first one was today" (10/18/2012)  . Esophageal dilation      "3 or 4 times" (10/19/2011)  . Coronary artery bypass graft N/A 10/19/2012    Procedure: CORONARY ARTERY BYPASS GRAFTING (CABG);  Surgeon: StMelrose NakayamaMD;  [facility] Service: Open [facility] Surgery;  Laterality: N/A;  . Peripheral vascular catheterization N/A 03/02/2015    Procedure: Porta Cath Insertion;  Surgeon: JaAlgernon HuxleyMD;  [facility];  Service: Cardiovascular;  Laterality: N/A;  . Abdominal hysterectomy  06/15/2015    Dx L/S, EXLAP TAH BSO omentectomy RSRx colostomy diaphragm resection stripping  . Cholecystectomy  06/15/2015    combined case with ovarian cancer debulking  . Colon surgery    . Ostomy     Social History   Social History  . Marital Status: Married    Spouse Name: N/A  .  Number of Children: 3  . Years of Education: N/A   Occupational History  . Retired    Social History Main Topics  . Smoking status: Former Smoker -- 1.00 packs/day for 30 years    Types: Cigarettes    Quit date: 06/11/1994  . Smokeless tobacco: Never Used  . Alcohol Use: No  . Drug Use: No  . Sexual Activity: No   Other Topics Concern  . None   Social History Narrative    Review of Systems  Constitutional: Positive for fatigue. Negative for fever, chills, appetite change and unexpected weight change.  Eyes: Negative for visual disturbance.  Respiratory: Positive for shortness of breath. Negative for cough.   Cardiovascular: Negative for chest pain and leg  swelling.  Gastrointestinal: Positive for nausea and vomiting. Negative for abdominal pain, diarrhea and constipation.  Musculoskeletal: Positive for myalgias and arthralgias.  Skin: Negative for color change and rash.  Neurological: Positive for weakness and numbness.  Hematological: Negative for adenopathy. Does not bruise/bleed easily.  Psychiatric/Behavioral: Positive for sleep disturbance. Negative for dysphoric mood. The patient is not nervous/anxious.        Objective:    BP 122/58 mmHg  Pulse 83  Temp(Src) 98.5 F (36.9 C) (Oral)  Wt 152 lb 8 oz (69.174 kg)  SpO2 97% Physical Exam  Constitutional: She is oriented to person, place, and time. She appears well-developed and well-nourished. She has a sickly appearance. No distress.  HENT:  Head: Normocephalic and atraumatic.  Right Ear: External ear normal.  Left Ear: External ear normal.  Nose: Nose normal.  Mouth/Throat: Oropharynx is clear and moist. No oropharyngeal exudate.  Eyes: Conjunctivae are normal. Pupils are equal, round, and reactive to light. Right eye exhibits no discharge. Left eye exhibits no discharge. No scleral icterus.  Neck: Normal range of motion. Neck supple. No tracheal deviation present. No thyromegaly present.  Cardiovascular: Normal rate, regular rhythm, normal heart sounds and intact distal pulses.  Exam reveals no gallop and no friction rub.   No murmur heard. Pulmonary/Chest: Accessory muscle usage present. No tachypnea. No respiratory distress. She has no decreased breath sounds. She has no wheezes. She has rhonchi (crackles bilateral bases). She has no rales. She exhibits no tenderness.  Musculoskeletal: Normal range of motion. She exhibits no edema or tenderness.  Lymphadenopathy:    She has no cervical adenopathy.  Neurological: She is alert and oriented to person, place, and time. No cranial nerve deficit. She exhibits normal muscle tone. Coordination normal.  Skin: Skin is warm and dry. No  rash noted. She is not diaphoretic. No erythema. There is pallor.  Psychiatric: She has a normal mood and affect. Her behavior is normal. Judgment and thought content normal.          Assessment & Plan:   Problem List Items Addressed This Visit      Unprioritized   Diabetes type 1, controlled ([place])    BG elevated. On insulin pump. She has follow up with endocrinology pending Monday.       Hyperlipidemia    Will hold Atorvastatin.      Ovarian cancer ([place])    Follow up with oncology pending today.      Recurrent pleural effusion on right - Primary    Dyspnea improving. Follow up CXR pending today. Continues on supplemental oxygen. Will folllow after oncology visit/.          Return in about 4 weeks (around 08/14/2015) for Recheck.  Ronette Deter, MD Internal Medicine  [facility]

## 2015-07-17 NOTE — Addendum Note (Signed)
Addended by: Ronette Deter A on: 07/17/2015 02:26 PM   Modules accepted: Level of Service

## 2015-07-18 LAB — CA 125: CA 125: 85.2 U/mL — ABNORMAL HIGH (ref 0.0–38.1)

## 2015-07-27 ENCOUNTER — Other Ambulatory Visit: Payer: Self-pay | Admitting: Internal Medicine

## 2015-07-28 ENCOUNTER — Ambulatory Visit: Payer: Medicare Other | Admitting: Hematology and Oncology

## 2015-07-28 ENCOUNTER — Other Ambulatory Visit: Payer: Self-pay | Admitting: *Deleted

## 2015-07-28 ENCOUNTER — Other Ambulatory Visit: Payer: Self-pay | Admitting: Hematology and Oncology

## 2015-07-28 ENCOUNTER — Ambulatory Visit
Admission: RE | Admit: 2015-07-28 | Discharge: 2015-07-28 | Disposition: A | Discharge status: Home / Self Care | Payer: Medicare Other | Source: Ambulatory Visit | Attending: Hematology and Oncology | Admitting: Hematology and Oncology

## 2015-07-28 ENCOUNTER — Inpatient Hospital Stay: Payer: Medicare Other

## 2015-07-28 ENCOUNTER — Other Ambulatory Visit: Payer: Medicare Other

## 2015-07-28 ENCOUNTER — Inpatient Hospital Stay (HOSPITAL_BASED_OUTPATIENT_CLINIC_OR_DEPARTMENT_OTHER): Payer: Medicare Other | Admitting: Hematology and Oncology

## 2015-07-28 VITALS — BP 114/63 | HR 84 | Temp 98.7°F | Resp 18 | Wt 147.3 lb

## 2015-07-28 DIAGNOSIS — D649 Anemia, unspecified: Secondary | ICD-10-CM

## 2015-07-28 DIAGNOSIS — Z794 Long term (current) use of insulin: Secondary | ICD-10-CM

## 2015-07-28 DIAGNOSIS — J90 Pleural effusion, not elsewhere classified: Secondary | ICD-10-CM

## 2015-07-28 DIAGNOSIS — C786 Secondary malignant neoplasm of retroperitoneum and peritoneum: Secondary | ICD-10-CM | POA: Diagnosis not present

## 2015-07-28 DIAGNOSIS — I700 Atherosclerosis of aorta: Secondary | ICD-10-CM | POA: Diagnosis not present

## 2015-07-28 DIAGNOSIS — C569 Malignant neoplasm of unspecified ovary: Secondary | ICD-10-CM

## 2015-07-28 DIAGNOSIS — Z90722 Acquired absence of ovaries, bilateral: Secondary | ICD-10-CM

## 2015-07-28 DIAGNOSIS — R630 Anorexia: Secondary | ICD-10-CM

## 2015-07-28 DIAGNOSIS — Z9889 Other specified postprocedural states: Secondary | ICD-10-CM | POA: Insufficient documentation

## 2015-07-28 DIAGNOSIS — G629 Polyneuropathy, unspecified: Secondary | ICD-10-CM

## 2015-07-28 DIAGNOSIS — M7989 Other specified soft tissue disorders: Secondary | ICD-10-CM

## 2015-07-28 DIAGNOSIS — Z79899 Other long term (current) drug therapy: Secondary | ICD-10-CM

## 2015-07-28 DIAGNOSIS — E109 Type 1 diabetes mellitus without complications: Secondary | ICD-10-CM

## 2015-07-28 DIAGNOSIS — I10 Essential (primary) hypertension: Secondary | ICD-10-CM

## 2015-07-28 DIAGNOSIS — I2510 Atherosclerotic heart disease of native coronary artery without angina pectoris: Secondary | ICD-10-CM

## 2015-07-28 DIAGNOSIS — R0600 Dyspnea, unspecified: Secondary | ICD-10-CM

## 2015-07-28 DIAGNOSIS — E876 Hypokalemia: Secondary | ICD-10-CM

## 2015-07-28 DIAGNOSIS — Z951 Presence of aortocoronary bypass graft: Secondary | ICD-10-CM | POA: Diagnosis not present

## 2015-07-28 DIAGNOSIS — Z87891 Personal history of nicotine dependence: Secondary | ICD-10-CM

## 2015-07-28 DIAGNOSIS — M545 Low back pain, unspecified: Secondary | ICD-10-CM

## 2015-07-28 DIAGNOSIS — R0602 Shortness of breath: Secondary | ICD-10-CM

## 2015-07-28 DIAGNOSIS — E785 Hyperlipidemia, unspecified: Secondary | ICD-10-CM

## 2015-07-28 DIAGNOSIS — I252 Old myocardial infarction: Secondary | ICD-10-CM

## 2015-07-28 DIAGNOSIS — K449 Diaphragmatic hernia without obstruction or gangrene: Secondary | ICD-10-CM

## 2015-07-28 DIAGNOSIS — R918 Other nonspecific abnormal finding of lung field: Secondary | ICD-10-CM | POA: Diagnosis not present

## 2015-07-28 DIAGNOSIS — I5032 Chronic diastolic (congestive) heart failure: Secondary | ICD-10-CM

## 2015-07-28 DIAGNOSIS — M069 Rheumatoid arthritis, unspecified: Secondary | ICD-10-CM

## 2015-07-28 DIAGNOSIS — R9389 Abnormal findings on diagnostic imaging of other specified body structures: Secondary | ICD-10-CM

## 2015-07-28 DIAGNOSIS — C801 Malignant (primary) neoplasm, unspecified: Secondary | ICD-10-CM

## 2015-07-28 DIAGNOSIS — Z90710 Acquired absence of both cervix and uterus: Secondary | ICD-10-CM

## 2015-07-28 DIAGNOSIS — R5383 Other fatigue: Secondary | ICD-10-CM

## 2015-07-28 DIAGNOSIS — K219 Gastro-esophageal reflux disease without esophagitis: Secondary | ICD-10-CM

## 2015-07-28 LAB — CBC WITH DIFFERENTIAL/PLATELET
Basophils Absolute: 0 10*3/uL (ref 0–0.1)
Basophils Relative: 1 %
Eosinophils Absolute: 0 10*3/uL (ref 0–0.7)
Eosinophils Relative: 1 %
HCT: 24.5 % — ABNORMAL LOW (ref 35.0–47.0)
Hemoglobin: 8.1 g/dL — ABNORMAL LOW (ref 12.0–16.0)
Lymphocytes Relative: 8 %
Lymphs Abs: 0.5 10*3/uL — ABNORMAL LOW (ref 1.0–3.6)
MCH: 28.3 pg (ref 26.0–34.0)
MCHC: 33 g/dL (ref 32.0–36.0)
MCV: 85.7 fL (ref 80.0–100.0)
Monocytes Absolute: 0.6 10*3/uL (ref 0.2–0.9)
Monocytes Relative: 11 %
Neutro Abs: 4.4 10*3/uL (ref 1.4–6.5)
Neutrophils Relative %: 79 %
Platelets: 347 10*3/uL (ref 150–440)
RBC: 2.86 MIL/uL — ABNORMAL LOW (ref 3.80–5.20)
RDW: 17.6 % — ABNORMAL HIGH (ref 11.5–14.5)
WBC: 5.5 10*3/uL (ref 3.6–11.0)

## 2015-07-28 LAB — COMPREHENSIVE METABOLIC PANEL
ALT: 9 U/L — ABNORMAL LOW (ref 14–54)
AST: 21 U/L (ref 15–41)
Albumin: 2 g/dL — ABNORMAL LOW (ref 3.5–5.0)
Alkaline Phosphatase: 181 U/L — ABNORMAL HIGH (ref 38–126)
Anion gap: 6 (ref 5–15)
BUN: 14 mg/dL (ref 6–20)
CO2: 26 mmol/L (ref 22–32)
Calcium: 8.1 mg/dL — ABNORMAL LOW (ref 8.9–10.3)
Chloride: 99 mmol/L — ABNORMAL LOW (ref 101–111)
Creatinine, Ser: 0.64 mg/dL (ref 0.44–1.00)
GFR calc Af Amer: >60 mL/min (ref >60)
GFR calc non Af Amer: >60 mL/min (ref >60)
Glucose, Bld: 188 mg/dL — ABNORMAL HIGH (ref 65–99)
Potassium: 3.8 mmol/L (ref 3.5–5.1)
Sodium: 131 mmol/L — ABNORMAL LOW (ref 135–145)
Total Bilirubin: 0.5 mg/dL (ref 0.3–1.2)
Total Protein: 8.1 g/dL (ref 6.5–8.1)

## 2015-07-28 NOTE — Progress Notes (Signed)
Patient had her surgery at [facility] on 06/14/12 and had an epidural she now has a knot on her back which is causing her pain and discomfort for the past week.

## 2015-07-28 NOTE — Progress Notes (Signed)
[facility] day:  07/28/2015   Chief Complaint: [name] is a 70 y.o. female with bilateral ovarian masses with associated peritoneal metastatic disease who is seen for 2 week assessment.  HPI:  The patient was last seen in the medical oncology clinic on 03/017/2017.  At that time, she was fatigued.  She had increased her Neurontin secondary to her grade III neuropathy.  Because of her increasing fatigue, she requested tapering off her Neurontin.  She completed her Neurontin taper 2 days ago.  She feels more alert and less shaky.    She notes pain in her back which she feels is at the old epidural site.  Her husband notes a little spot on her back which has scabbed over.  She has had pain for 12 days.  She has taken Tylenol.  She denies any current fever or cough.  She states that her breathing is "better".  She does note a temperature of 99.2-100.2 over 1 week ago.  She denies any pleuritic pain.  She continues to have edema in her hands and legs for which she uses Lasix.  Regarding her performance status, she needs help with getting dressed and the bathroom.  She can now get into a chair by herself.  Past Medical History  Diagnosis Date  . Leukopenia 2012    s/p bone marrow biopsy, Dr. Ma Hillock  . Cholelithiasis   . GERD (gastroesophageal reflux disease)   . Hypertension   . Hyperlipidemia   . Esophageal stricture   . Chronic diastolic CHF (congestive heart failure) ([place])   . CAD (coronary artery disease)     a. 09/2012 Cath: LM nl, LAD 95p, 24m LCX 930mOM2 50, RCA 100.  . Marland Kitchenypokalemia   . Herniated disc   . Pneumonia 2013; 08/2012    "one lung; double" (10/18/2012)  . Exertional shortness of breath   . Type I diabetes mellitus [place]    "dx'd in 1957" (10/18/2012)  . History of pancytopenia   . H/O hiatal hernia   . Rheumatoid arthritis(714.0)   . [place]STEMI (non-ST elevated myocardial infarction) ([facility]    "mild" (10/18/2012)  .  Collagen vascular disease ([facility]  . [facility]    Past Surgical History  Procedure Laterality Date  . Esophagogastroduodenoscopy  2012    Dr. IfMinna Merritts. Tubal ligation  1970  . Cataract extraction w/ intraocular lens implant Right 2010  . Cardiac catheterization  10/18/2012    "first one was today" (10/18/2012)  . Esophageal dilation      "3 or 4 times" (10/19/2011)  . Coronary artery bypass graft N/A 10/19/2012    Procedure: CORONARY ARTERY BYPASS GRAFTING (CABG);  Surgeon: StMelrose NakayamaMD;  [facility] Service: [facility] Surgery;  Laterality: N/A;  . Peripheral vascular catheterization N/A 03/02/2015    Procedure: Porta Cath Insertion;  Surgeon: JaAlgernon HuxleyMD;  [facility];  Service: Cardiovascular;  Laterality: N/A;  . Abdominal hysterectomy  06/15/2015    Dx L/S, EXLAP TAH BSO omentectomy RSRx colostomy diaphragm resection stripping  . Cholecystectomy  06/15/2015    combined case with ovarian cancer debulking  . Colon surgery    . Ostomy      Family History  Problem Relation Age of Onset  . Diabetes Mother   . Arthritis Mother   . Diabetes Father   . Arthritis Father   . Bone cancer Sister  Social History:  reports that she quit smoking about 21 years ago. Her smoking use included Cigarettes. She has a 30 pack-year smoking history. She has never used smokeless tobacco. She reports that she does not drink alcohol or use illicit drugs.  The patient is accompanied by her husband today.  Allergies:  Allergies  Allergen Reactions  . Codeine Nausea And Vomiting    Current Medications: Current Outpatient Prescriptions  Medication Sig Dispense Refill  . amLODipine (NORVASC) 5 MG tablet Take 5 mg by mouth daily.    . furosemide (LASIX) 40 MG tablet TAKE 1 TABLET BY MOUTH TWICE A DAY 180 tablet 1  . Insulin Human (INSULIN PUMP) SOLN Pt uses Humalog insulin.    Marland Kitchen lidocaine-prilocaine (EMLA) cream Apply 1 application topically as  needed (prior to accessing port).    . losartan (COZAAR) 100 MG tablet TAKE 1 TABLET BY MOUTH ONCE A DAY 90 tablet 1  . metoprolol tartrate (LOPRESSOR) 25 MG tablet Take 25 mg by mouth 2 (two) times daily.    . ondansetron (ZOFRAN) 8 MG tablet Take 1 tablet (8 mg total) by mouth every 8 (eight) hours as needed for nausea or vomiting. 20 tablet 1  . potassium chloride SA (K-DUR,KLOR-CON) 20 MEQ tablet Take 20 mEq by mouth 2 (two) times daily.    . ranitidine (ZANTAC) 150 MG tablet Take 1 tablet (150 mg total) by mouth 2 (two) times daily. 180 tablet 2  . zoledronic acid (RECLAST) 5 MG/100ML SOLN Inject 5 mg into the vein See admin instructions. Pt gets an injection once per year.     No current facility-administered medications for this visit.   Facility-Administered Medications Ordered in Other Visits  Medication Dose Route Frequency Provider Last Rate Last Dose  . sodium chloride 0.9 % injection 10 mL  10 mL Intracatheter PRN Rosey Bath, MD      . sodium chloride 0.9 % injection 10 mL  10 mL Intravenous PRN Rosey Bath, MD   10 mL at 04/13/15 2162    Review of Systems:  GENERAL:  Less fatigued.  Low grade temperature (99-100.2 > 1 week ago).  No sweats.  Weight down 5 pounds since last visit. PERFORMANCE STATUS (ECOG):  3 HEENT:  No visual changes, sore throat, mouth sores or tenderness. Lungs:  Shortness of breath, improved.  No cough.  No hemoptysis. Cardiac:  No chest pain, palpitations, orthopnea, or PND. GI:  Appetite modest.  No constipation, diarrhea, melena or hematochezia. GU:  No urgency, frequency, dysuria, or hematuria. Musculoskeletal:  Severe rheumatoid arthritis.  Osteoporosis.  No back pain. No muscle tenderness. Extremities:  Lower extremity swelling on Lasix. Skin:  Decubitus ulcer, healed.  Spot on back at old epidural site.  No rashes or skin changes. Neuro:  Neuropathy, now off Neurontin.  No headache, numbness or weakness, balance or coordination  issues. Endocrine:  Diabetes.  Insulin pump.  No thyroid issues, hot flashes or night sweats. Psych:  No mood changes, depression or anxiety. Pain:  New pain in back. Review of systems:  All other systems reviewed and found to be negative.  Physical Exam: Blood pressure 114/63, pulse 84, temperature 98.7 F (37.1 C), temperature source Tympanic, resp. rate 18, weight 147 lb 4.3 oz (66.8 kg), SpO2 94 %. GENERAL:  Fatigued appearing woman sitting comfortably in a wheelchair in the exam room in no acute distress.   MENTAL STATUS:  Alert and oriented to person, place and time. HEAD:  Wearing a blue head  wrap.  Normocephalic, atraumatic, face symmetric, no Cushingoid features. EYES:  Gold rimmed glasses.  Blue eyes.  No conjunctivitis or scleral icterus.  ENT: Not on oxygen ([place] off to the side).  Oropharynx clear without lesion. Tongue normal. Mucous membranes moist.  RESPIRATORY: Decreased breath sounds right base with crackles on deep inspiration.  No wheezes or rhonchi. CARDIOVASCULAR: Regular rate and rhythm without murmur, rub or gallop. ABDOMEN: Soft, non-tender with active bowel sounds and no appreciable hepatosplenomegaly. No palpable nodularity or masses. BACK:  Pain on deep palpation right lower chest wall. SKIN: Epidural site with tiny eschar.  No surrounding erythema.  No induration. EXTREMITIES: Bilateral 1+ lower extremity edema.  No skin discoloration or tenderness. No palpable cords. LYMPH NODES: No palpable cervical, supraclavicular, axillary or inguinal adenopathy  NEUROLOGICAL: Appropriate. PSYCH: Appropriate.  Appointment on 07/28/2015  Component Date Value Ref Range Status  . WBC 07/28/2015 5.5  3.6 - 11.0 K/uL Final  . RBC 07/28/2015 2.86* 3.80 - 5.20 MIL/uL Final  . Hemoglobin 07/28/2015 8.1* 12.0 - 16.0 g/dL Final  . HCT 07/28/2015 24.5* 35.0 - 47.0 % Final  . MCV 07/28/2015 85.7  80.0 - 100.0 fL Final  . MCH 07/28/2015 28.3  26.0 - 34.0 pg Final  . MCHC  07/28/2015 33.0  32.0 - 36.0 g/dL Final  . RDW 07/28/2015 17.6* 11.5 - 14.5 % Final  . Platelets 07/28/2015 347  150 - 440 K/uL Final  . Neutrophils Relative % 07/28/2015 79   Final  . Neutro Abs 07/28/2015 4.4  1.4 - 6.5 K/uL Final  . Lymphocytes Relative 07/28/2015 8   Final  . Lymphs Abs 07/28/2015 0.5* 1.0 - 3.6 K/uL Final  . Monocytes Relative 07/28/2015 11   Final  . Monocytes Absolute 07/28/2015 0.6  0.2 - 0.9 K/uL Final  . Eosinophils Relative 07/28/2015 1   Final  . Eosinophils Absolute 07/28/2015 0.0  0 - 0.7 K/uL Final  . Basophils Relative 07/28/2015 1   Final  . Basophils Absolute 07/28/2015 0.0  0 - 0.1 K/uL Final  . Sodium 07/28/2015 131* 135 - 145 mmol/L Final  . Potassium 07/28/2015 3.8  3.5 - 5.1 mmol/L Final  . Chloride 07/28/2015 99* 101 - 111 mmol/L Final  . CO2 07/28/2015 26  22 - 32 mmol/L Final  . Glucose, Bld 07/28/2015 188* 65 - 99 mg/dL Final  . BUN 07/28/2015 14  6 - 20 mg/dL Final  . Creatinine, Ser 07/28/2015 0.64  0.44 - 1.00 mg/dL Final  . Calcium 07/28/2015 8.1* 8.9 - 10.3 mg/dL Final  . Total Protein 07/28/2015 8.1  6.5 - 8.1 g/dL Final  . Albumin 07/28/2015 2.0* 3.5 - 5.0 g/dL Final  . AST 07/28/2015 21  15 - 41 U/L Final  . ALT 07/28/2015 9* 14 - 54 U/L Final  . Alkaline Phosphatase 07/28/2015 181* 38 - 126 U/L Final  . Total Bilirubin 07/28/2015 0.5  0.3 - 1.2 mg/dL Final  . GFR calc non Af Amer 07/28/2015 >60  >60 mL/min Final  . GFR calc Af Amer 07/28/2015 >60  >60 mL/min Final   Comment: (NOTE) The eGFR has been calculated using the CKD EPI equation. This calculation has not been validated in all clinical situations. eGFR's persistently <60 mL/min signify possible Chronic Kidney Disease.   Georgiann Hahn gap 07/28/2015 6  5 - 15 Final    Assessment:  DAWNYEL LEVEN is a 71 y.o. female with clinical stage IIIC (T3cN1Mx) ovarian cancer presenting with abdominal discomfort and bloating.  Omental biopsy on 02/23/2015 revealed metastatic high grade  serous carcinoma, consistent with gynecologic origin.  CA125 was 707 on 02/17/2015.  Abdomen and pelvic CT scan on 02/13/2015 revealed bilateral mass-like adnexal regions (right adnexal mass 5.6 x 5.0 cm and the left adnexa mass 10.3 x 6.0 x 9.9 cm).  There was a large amount of soft tissue throughout the peritoneal cavity involving the omentum and other peritoneal surfaces.  There was a small volume ascites. There was a peripheral 3.3 x 1.9 cm low-attenuation lesion overlying the right lobe of the liver , likely representing a serosal implant. There was 1.4 x 2.2 cm ill-defined peripheral lesion within the inferior aspect of segment 6 adjacent to the ampulla in the duodenum.   She has severe rheumatoid arthritis.  Methotrexate and Enbrel are on hold.  She has a normocytic anemia.  Work-up on 02/17/2015 revealed a normal ferritin, B12, folate, TSH.  She denies any melena or hematochezia.  She has diabetes and is on an insulin pump.  She is status post cycle #4 neoadjuvant carboplatin and Taxol (03/05/2015 - 05/22/2015).  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  She had loose stools on oral magnesium.  She was initially on Neurontin then switched Lyrica with cycle #3.  Cycle #4 was notable for neutropenia (ANC 300) requiring GCSF x 3 days.  CA125 was 802.9 on 03/30/2015, 567.9 on 04/13/2015, 168.8 on 05/15/2015, 85.2 on 07/17/2015, and 68.6 on 07/28/2015..  Abdominal and pelvic CT scan on 04/28/2015 revealed decreasing bilateral ovarian masses.   The left adnexal mass measured 4.0 x 6.1 cm (previously 5.0 x 8.5 cm). The right ovary measured 3.8 x 4.9 cm (previously 4.7 x 5.6 cm).  There was improved peritoneal carcinomatosis.  There was a small amount of ascites.  The hepatic dome lesion was stable. The previously seen right hepatic lobe lesion was not well-visualized.  There was a small left pleural effusion and trace right pleural effusion.  The nodular lesion at the ampulla of Vater, extending into  the duodenum was stable.  There was no biliary ductal dilatation.  She underwent exploratory laparotomy, lysis of adhesions, total abdominal hysterectomy with bilateral salpingo-oophorectomy, infracolic omentectomy, optimal tumor debulking(< 1 cm), recto-sigmoid resection with creation of end colostomy, cholecystectomy, mobilization of splenic flexure and liver with diaphragmatic stripping on 06/15/2015. The right diaphragm was cleared of tumor. During dissection, the diaphragm was entered and closed with sutures.   She has had a recurrent right sided pleural effusion.  She underwent thoracentesis of 650 cc on post-operative day 3.  She was [facility] to [facility] on 07/01/2015 and 07/07/2015 for recurrent shortness of breath.  She has undergone 2 additional thoracentesis (1.1 L on 07/02/2015 and 850 cc on 07/08/2015).  Cytology was negative x 2.  Bilateral lower extremity duplex on 07/03/2015 was negative.  Echo on 07/08/2015 revealed en EF of 55-60%.  She is now on oxygen 2 liter/min via [place].  She has progressive anemia.  She denies any melena or hematochezia.  Diet is modest.  Symptomatically, she her fatigue has improved off Neurontin. She has new back pain/right lower lobe discomfort.  She denies any respiratory symptoms.  She is using her oxygen prn.  She has a grade III neuropathy.  Exam reveals decreased breath sounds RLL and fine crackles on deep inspiration.  Epidural site is unremarkable.  Plan: 1.  Labs today:  CBC with diff, CMP, ferritin, iron studies, B12, folate, TSH. 2.  Discuss patient's thoughts about chemotherapy.  Patient wishes  to switch to carboplatin and gemcitabine  3.  CXR (PA and lateral). 4.  RTC in 1 week for MD assessment, labs (CBC with diff, CMP, Mg), and reinitiation of chemotherapy.  Addendum:  CXR today revealed findings concerning for right-sided empyema or less likely intrapulmonary abscess.  The patient was contacted.  Chest CT with contrast will per performed tomorrow  morning.   Lequita Asal, MD  07/28/2015, 11:29 AM

## 2015-07-29 ENCOUNTER — Ambulatory Visit
Admission: RE | Admit: 2015-07-29 | Discharge: 2015-07-29 | Disposition: A | Discharge status: Home / Self Care | Payer: Medicare Other | Source: Ambulatory Visit | Attending: Hematology and Oncology | Admitting: Hematology and Oncology

## 2015-07-29 DIAGNOSIS — J869 Pyothorax without fistula: Secondary | ICD-10-CM | POA: Diagnosis not present

## 2015-07-29 DIAGNOSIS — R9389 Abnormal findings on diagnostic imaging of other specified body structures: Secondary | ICD-10-CM

## 2015-07-29 DIAGNOSIS — L0291 Cutaneous abscess, unspecified: Secondary | ICD-10-CM | POA: Diagnosis not present

## 2015-07-29 DIAGNOSIS — D500 Iron deficiency anemia secondary to blood loss (chronic): Secondary | ICD-10-CM | POA: Insufficient documentation

## 2015-07-29 DIAGNOSIS — C569 Malignant neoplasm of unspecified ovary: Secondary | ICD-10-CM | POA: Diagnosis present

## 2015-07-29 DIAGNOSIS — R938 Abnormal findings on diagnostic imaging of other specified body structures: Secondary | ICD-10-CM | POA: Diagnosis present

## 2015-07-29 LAB — CA 125: CA 125: 68.6 U/mL — ABNORMAL HIGH (ref 0.0–38.1)

## 2015-07-29 MED ORDER — IOHEXOL 300 MG/ML  SOLN
75.0000 mL | Freq: Once | INTRAMUSCULAR | Status: AC | PRN
  Administered 2015-07-29: 75 mL via INTRAVENOUS

## 2015-07-30 ENCOUNTER — Telehealth: Payer: Self-pay | Admitting: Hematology and Oncology

## 2015-07-30 ENCOUNTER — Encounter: Payer: Self-pay | Admitting: Hematology and Oncology

## 2015-07-30 ENCOUNTER — Other Ambulatory Visit: Payer: Self-pay | Admitting: Hematology and Oncology

## 2015-07-30 DIAGNOSIS — E46 Unspecified protein-calorie malnutrition: Secondary | ICD-10-CM | POA: Insufficient documentation

## 2015-07-30 NOTE — Progress Notes (Signed)
[facility] day:  07/17/2015  Chief Complaint: [name] is a 70 y.o. female with bilateral ovarian masses with associated peritoneal metastatic disease who is seen for reassessment.  HPI:  The patient was last seen in the medical oncology clinic on 07/10/2015.  At that time, she was seen for assessment after 2 hospitalizations for recurrent pleural effusions.  Symptomatically, she was markedly fatigued.  Performance status was 3.  She had a grade III neuropathy.  We discussed advancing her dose of Neurontin.  We discussed reinstitution of chemotherapy when she had improved.    During the interim, she has remained weak and fatigued.  Her neuropathy is "awful".  Appetite is down.  Blood sugar was transiently high (300-400).  She notes that she has no energy.  She is sleeping all of the time.  She believes her fatigue is do to the Neurontin.  She notes Lyrica was not helpful.  She is eating little as she has no appetite.  Her anti-emetic made her vomit.  Oxygen saturations have been good (96% on 2 liters oxygen via nasal cannula).   Past Medical History  Diagnosis Date  . Leukopenia 2012    s/p bone marrow biopsy, Dr. Ma Hillock  . Cholelithiasis   . GERD (gastroesophageal reflux disease)   . Hypertension   . Hyperlipidemia   . Esophageal stricture   . Chronic diastolic CHF (congestive heart failure) ([place])   . CAD (coronary artery disease)     a. 09/2012 Cath: LM nl, LAD 95p, 19m LCX 978mOM2 50, RCA 100.  . Marland Kitchenypokalemia   . Herniated disc   . Pneumonia 2013; 08/2012    "one lung; double" (10/18/2012)  . Exertional shortness of breath   . Type I diabetes mellitus ([facility]    "dx'd in 1957" (10/18/2012)  . History of pancytopenia   . H/O hiatal hernia   . Rheumatoid arthritis(714.0)   . NSTEMI (non-ST elevated myocardial infarction) ([facility]    "mild" (10/18/2012)  . Collagen vascular disease [place]  . [facility]    Past  Surgical History  Procedure Laterality Date  . Esophagogastroduodenoscopy  2012    Dr. IfMinna Merritts. Tubal ligation  1970  . Cataract extraction w/ intraocular lens implant Right 2010  . Cardiac catheterization  10/18/2012    "first one was today" (10/18/2012)  . Esophageal dilation      "3 or 4 times" (10/19/2011)  . Coronary artery bypass graft N/A 10/19/2012    Procedure: CORONARY ARTERY BYPASS GRAFTING (CABG);  Surgeon: StMelrose NakayamaMD;  [facility]: [facility];  Laterality: N/A;  . Peripheral vascular catheterization N/A 03/02/2015    Procedure: Porta Cath Insertion;  Surgeon: JaAlgernon HuxleyMD;  [place];  Service: Cardiovascular;  Laterality: N/A;  . Abdominal hysterectomy  06/15/2015    Dx L/S, EXLAP TAH BSO omentectomy RSRx colostomy diaphragm resection stripping  . Cholecystectomy  06/15/2015    combined case with ovarian cancer debulking  . Colon surgery    . Ostomy      Family History  Problem Relation Age of Onset  . Diabetes Mother   . Arthritis Mother   . Diabetes Father   . Arthritis Father   . Bone cancer Sister     Social History:  reports that she quit smoking about 21 years ago. Her smoking use included Cigarettes. She has a 30 pack-year  smoking history. She has never used smokeless tobacco. She reports that she does not drink alcohol or use illicit drugs.  The patient is accompanied by her husband today.  Allergies:  Allergies  Allergen Reactions  . Codeine Nausea And Vomiting    Current Medications: Current Outpatient Prescriptions  Medication Sig Dispense Refill  . amLODipine (NORVASC) 5 MG tablet Take 5 mg by mouth daily.    . Insulin Human (INSULIN PUMP) SOLN Pt uses Humalog insulin.    Marland Kitchen lidocaine-prilocaine (EMLA) cream Apply 1 application topically as needed (prior to accessing port).    . metoprolol tartrate (LOPRESSOR) 25 MG tablet Take 25 mg by mouth 2 (two) times daily.    . ondansetron (ZOFRAN) 8  MG tablet Take 1 tablet (8 mg total) by mouth every 8 (eight) hours as needed for nausea or vomiting. 20 tablet 1  . potassium chloride SA (K-DUR,KLOR-CON) 20 MEQ tablet Take 20 mEq by mouth 2 (two) times daily.    . ranitidine (ZANTAC) 150 MG tablet Take 1 tablet (150 mg total) by mouth 2 (two) times daily. 180 tablet 2  . zoledronic acid (RECLAST) 5 MG/100ML SOLN Inject 5 mg into the vein See admin instructions. Pt gets an injection once per year.    . furosemide (LASIX) 40 MG tablet TAKE 1 TABLET BY MOUTH TWICE A DAY 180 tablet 1  . losartan (COZAAR) 100 MG tablet TAKE 1 TABLET BY MOUTH ONCE A DAY 90 tablet 1   No current facility-administered medications for this visit.   Facility-Administered Medications Ordered in Other Visits  Medication Dose Route Frequency Provider Last Rate Last Dose  . sodium chloride 0.9 % injection 10 mL  10 mL Intracatheter PRN Lequita Asal, MD      . sodium chloride 0.9 % injection 10 mL  10 mL Intravenous PRN Lequita Asal, MD   10 mL at 04/13/15 2836    Review of Systems:  GENERAL:  Fatigue.  Sleepy.  No fevers or sweats.  Weight up 3 pounds since last visit. PERFORMANCE STATUS (ECOG):  3 HEENT:  No visual changes, sore throat, mouth sores or tenderness. Lungs:  Mild shortness of breath on oxygen.  No cough.  No hemoptysis. O2 sats good on oxygen. Cardiac:  No chest pain, palpitations, orthopnea, or PND. GI:  Appetite poor.  Emesis x 1.  No constipation, diarrhea, melena or hematochezia. GU:  No urgency, frequency, dysuria, or hematuria. Musculoskeletal:  Severe rheumatoid arthritis.  Osteoporosis.  No back pain. No muscle tenderness. Extremities:  No pain or swelling. Skin:  Decubitus ulcer, nearly healed.  No rashes or skin changes. Neuro:  Neuropathy "awful".  No headache, numbness or weakness, balance or coordination issues. Endocrine:  Diabetes.  Insulin pump.  No thyroid issues, hot flashes or night sweats. Psych:  No mood changes,  depression or anxiety. Pain:  No focal pain. Review of systems:  All other systems reviewed and found to be negative.  Physical Exam: Blood pressure 112/55, pulse 85, resp. rate 18, height 5' 3"  (1.6 m), weight 152 lb 12.8 oz (69.31 kg), SpO2 96 %. GENERAL:  Fatigued appearing woman sitting comfortably in the exam room in no acute distress.   MENTAL STATUS:  Alert and oriented to person, place and time. HEAD:  Wearing a blue head wrap.  Normocephalic, atraumatic, face symmetric, no Cushingoid features. EYES:  Gold rimmed glasses.  Blue eyes.  No conjunctivitis or scleral icterus.  ENT: [place] in place.  Oropharynx clear without lesion. Tongue  normal. Mucous membranes moist.  RESPIRATORY: Decreased breath sounds right base.  No wheezes or rhonchi. CARDIOVASCULAR: Regular rate and rhythm without murmur, rub or gallop. ABDOMEN: Soft, non-tender with active bowel sounds and no appreciable hepatosplenomegaly. No palpable nodularity or masses. SKIN: No rashes, ulcers or lesions. EXTREMITIES: Bilateral 1+ lower extremity edema.  No skin discoloration or tenderness. No palpable cords. LYMPH NODES: No palpable cervical, supraclavicular, axillary or inguinal adenopathy  NEUROLOGICAL: Appropriate. PSYCH: Appropriate.  Appointment on 07/17/2015  Component Date Value Ref Range Status  . WBC 07/17/2015 5.6  3.6 - 11.0 K/uL Final  . RBC 07/17/2015 3.00* 3.80 - 5.20 MIL/uL Final  . Hemoglobin 07/17/2015 8.9* 12.0 - 16.0 g/dL Final  . HCT 07/17/2015 26.3* 35.0 - 47.0 % Final  . MCV 07/17/2015 87.9  80.0 - 100.0 fL Final  . MCH 07/17/2015 29.7  26.0 - 34.0 pg Final  . MCHC 07/17/2015 33.8  32.0 - 36.0 g/dL Final  . RDW 07/17/2015 16.7* 11.5 - 14.5 % Final  . Platelets 07/17/2015 311  150 - 440 K/uL Final  . Neutrophils Relative % 07/17/2015 76   Final  . Neutro Abs 07/17/2015 4.3  1.4 - 6.5 K/uL Final  . Lymphocytes Relative 07/17/2015 10   Final  . Lymphs Abs 07/17/2015 0.5* 1.0 - 3.6 K/uL  Final  . Monocytes Relative 07/17/2015 13   Final  . Monocytes Absolute 07/17/2015 0.7  0.2 - 0.9 K/uL Final  . Eosinophils Relative 07/17/2015 0   Final  . Eosinophils Absolute 07/17/2015 0.0  0 - 0.7 K/uL Final  . Basophils Relative 07/17/2015 1   Final  . Basophils Absolute 07/17/2015 0.0  0 - 0.1 K/uL Final  . Sodium 07/17/2015 128* 135 - 145 mmol/L Final  . Potassium 07/17/2015 4.4  3.5 - 5.1 mmol/L Final  . Chloride 07/17/2015 96* 101 - 111 mmol/L Final  . CO2 07/17/2015 24  22 - 32 mmol/L Final  . Glucose, Bld 07/17/2015 351* 65 - 99 mg/dL Final  . BUN 07/17/2015 15  6 - 20 mg/dL Final  . Creatinine, Ser 07/17/2015 0.82  0.44 - 1.00 mg/dL Final  . Calcium 07/17/2015 8.0* 8.9 - 10.3 mg/dL Final  . Total Protein 07/17/2015 7.3  6.5 - 8.1 g/dL Final  . Albumin 07/17/2015 2.1* 3.5 - 5.0 g/dL Final  . AST 07/17/2015 47* 15 - 41 U/L Final  . ALT 07/17/2015 26  14 - 54 U/L Final  . Alkaline Phosphatase 07/17/2015 214* 38 - 126 U/L Final  . Total Bilirubin 07/17/2015 0.5  0.3 - 1.2 mg/dL Final  . GFR calc non Af Amer 07/17/2015 >60  >60 mL/min Final  . GFR calc Af Amer 07/17/2015 >60  >60 mL/min Final   Comment: (NOTE) The eGFR has been calculated using the CKD EPI equation. This calculation has not been validated in all clinical situations. eGFR's persistently <60 mL/min signify possible Chronic Kidney Disease.   . Anion gap 07/17/2015 8  5 - 15 Final  . CA 125 07/17/2015 85.2* 0.0 - 38.1 U/mL Final   Comment: (NOTE) [employer] ECLIA methodology Performed At: [facility] [address] [place], [place] [id number] Lindon Romp MD UE:[id number]     Assessment:  ONESTY CLAIR is a 69 y.o. female with clinical stage IIIC (T3cN1Mx) ovarian cancer presenting with abdominal discomfort and bloating.  Omental biopsy on 02/23/2015 revealed metastatic high grade serous carcinoma, consistent with gynecologic origin.  CA125 was 707 on 02/17/2015.  Abdomen and pelvic CT scan  on  02/13/2015 revealed bilateral mass-like adnexal regions (right adnexal mass 5.6 x 5.0 cm and the left adnexa mass 10.3 x 6.0 x 9.9 cm).  There was a large amount of soft tissue throughout the peritoneal cavity involving the omentum and other peritoneal surfaces.  There was a small volume ascites. There was a peripheral 3.3 x 1.9 cm low-attenuation lesion overlying the right lobe of the liver , likely representing a serosal implant. There was 1.4 x 2.2 cm ill-defined peripheral lesion within the inferior aspect of segment 6 adjacent to the ampulla in the duodenum.   She has severe rheumatoid arthritis.  Methotrexate and Enbrel are on hold.  She has a normocytic anemia.  Work-up on 02/17/2015 revealed a normal ferritin, B12, folate, TSH.  She denies any melena or hematochezia.  She has diabetes and is on an insulin pump.  She is status post cycle #4 neoadjuvant carboplatin and Taxol (03/05/2015 - 05/22/2015).  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  She had loose stools on oral magnesium.  She was initially on Neurontin then switched Lyrica with cycle #3.  Cycle #4 was notable for neutropenia (ANC 300) requiring GCSF x 3 days.  CA125 was 802.9 on 03/30/2015, 567.9 on 04/13/2015, and 168.8 on 05/15/2015.  Abdominal and pelvic CT scan on 04/28/2015 revealed decreasing bilateral ovarian masses.   The left adnexal mass measured 4.0 x 6.1 cm (previously 5.0 x 8.5 cm). The right ovary measured 3.8 x 4.9 cm (previously 4.7 x 5.6 cm).  There was improved peritoneal carcinomatosis.  There was a small amount of ascites.  The hepatic dome lesion was stable. The previously seen right hepatic lobe lesion was not well-visualized.  There was a small left pleural effusion and trace right pleural effusion.  The nodular lesion at the ampulla of Vater, extending into the duodenum was stable.  There was no biliary ductal dilatation.  She underwent exploratory laparotomy, lysis of adhesions, total abdominal hysterectomy  with bilateral salpingo-oophorectomy, infracolic omentectomy, optimal tumor debulking(< 1 cm), recto-sigmoid resection with creation of end colostomy, cholecystectomy, mobilization of splenic flexure and liver with diaphragmatic stripping on 06/15/2015. The right diaphragm was cleared of tumor. During dissection, the diaphragm was entered and closed with sutures.   She has had a recurrent right sided pleural effusion.  She underwent thoracentesis of 650 cc on post-operative day 3.  She was admitted to [facility] on 07/01/2015 and 07/07/2015 for recurrent shortness of breath.  She has undergone 2 additional thoracentesis (1.1 L on 07/02/2015 and 850 cc on 07/08/2015).  Cytology was negative x 2.  Bilateral lower extremity duplex on 07/03/2015 was negative.  Echo on 07/08/2015 revealed en EF of 55-60%.  She is on oxygen 2 liter/min via [place].  Symptomatically, she has become more fatigued with increasing Neurontin.  Performance status is 3.  She has a grade III neuropathy.  Plan: 1.  Labs today  CBC with diff, CMP, CA125. 2.  Discuss slow taper off of Neurontin. 3.  Discuss anti-emetics and other options. 4.  Discuss plan for initiation of chemotherapy once performance status improves.  Discuss no further Taxol secondary to debilitating neuropathy.  Discuss consideration of platinum doublet (carboplatin + Doxil or carboplatin + gemcitabine).  Discuss small clinic trial with equivalent results for adjuvant carboplatin + Doxil  versus carboplatin and Taxol.  Discuss side effects of Doxil and gemcitabine.  Information provided.  5.  RTC in 2 weeks for MD assessment and re-evaluation for possible chemotherapy.  Approximately 45 minutes  were spent with the patient reviewing current issues, plan to taper off of Neurontin, and chemotherapy considerations given debilitating neuropathy.  I also spoke with Dr. Fransisca Connors regarding current clinic status.  Patient is agreement regarding no further Taxol.   Lequita Asal, MD  07/17/2015

## 2015-07-30 NOTE — Telephone Encounter (Signed)
Re:  Chest CT results  I spoke with the patient and her husband about the CT results from today.  She has an empyema and likely liver abscess.  This is the cause of her pain and discomfort.  She will be admitted to [facility] under the [facility] service.  She will be contacted by them for direct admission and bed assignment.  I spoke with Dr. Genevive Bi at [facility] as well as Dr. Fransisca Connors.  Lequita Asal, MD

## 2015-08-04 ENCOUNTER — Inpatient Hospital Stay: Payer: Medicare Other | Admitting: Hematology and Oncology

## 2015-08-04 ENCOUNTER — Inpatient Hospital Stay: Payer: Medicare Other

## 2015-08-04 ENCOUNTER — Other Ambulatory Visit: Payer: Self-pay | Admitting: Hematology and Oncology

## 2015-08-07 DIAGNOSIS — E876 Hypokalemia: Secondary | ICD-10-CM | POA: Insufficient documentation

## 2015-08-11 ENCOUNTER — Ambulatory Visit: Payer: Medicare Other | Admitting: Cardiovascular Disease

## 2015-08-17 ENCOUNTER — Other Ambulatory Visit: Payer: Self-pay | Admitting: Internal Medicine

## 2015-09-02 ENCOUNTER — Telehealth: Payer: Self-pay

## 2015-09-02 ENCOUNTER — Other Ambulatory Visit: Payer: Self-pay | Admitting: Internal Medicine

## 2015-09-02 NOTE — Telephone Encounter (Signed)
  Oncology Nurse Navigator Documentation  Navigator Location: [facility] Onc (09/02/15 1600) Navigator Encounter Type: Telephone (09/02/15 1600) Telephone: Appt Confirmation/Clarification (09/02/15 1600)                                        Time Spent with Patient: 15 (09/02/15 1600)   Informed [name] of lab appt 5/4 at 10:15 and appt with Dr Mike Gip following at 10:30. Readback performed

## 2015-09-03 ENCOUNTER — Other Ambulatory Visit: Payer: Self-pay | Admitting: Hematology and Oncology

## 2015-09-03 ENCOUNTER — Inpatient Hospital Stay: Payer: Medicare Other | Attending: Hematology and Oncology

## 2015-09-03 ENCOUNTER — Ambulatory Visit: Payer: Medicare Other | Admitting: Physical Therapy

## 2015-09-03 ENCOUNTER — Inpatient Hospital Stay (HOSPITAL_BASED_OUTPATIENT_CLINIC_OR_DEPARTMENT_OTHER): Payer: Medicare Other | Admitting: Hematology and Oncology

## 2015-09-03 VITALS — BP 124/63 | HR 84 | Temp 96.3°F | Resp 18 | Ht 63.0 in | Wt 134.5 lb

## 2015-09-03 DIAGNOSIS — E785 Hyperlipidemia, unspecified: Secondary | ICD-10-CM

## 2015-09-03 DIAGNOSIS — E119 Type 2 diabetes mellitus without complications: Secondary | ICD-10-CM | POA: Diagnosis not present

## 2015-09-03 DIAGNOSIS — Z90710 Acquired absence of both cervix and uterus: Secondary | ICD-10-CM | POA: Insufficient documentation

## 2015-09-03 DIAGNOSIS — D709 Neutropenia, unspecified: Secondary | ICD-10-CM | POA: Insufficient documentation

## 2015-09-03 DIAGNOSIS — E876 Hypokalemia: Secondary | ICD-10-CM

## 2015-09-03 DIAGNOSIS — Z90722 Acquired absence of ovaries, bilateral: Secondary | ICD-10-CM | POA: Diagnosis not present

## 2015-09-03 DIAGNOSIS — D649 Anemia, unspecified: Secondary | ICD-10-CM | POA: Insufficient documentation

## 2015-09-03 DIAGNOSIS — Z794 Long term (current) use of insulin: Secondary | ICD-10-CM | POA: Insufficient documentation

## 2015-09-03 DIAGNOSIS — I998 Other disorder of circulatory system: Secondary | ICD-10-CM

## 2015-09-03 DIAGNOSIS — M069 Rheumatoid arthritis, unspecified: Secondary | ICD-10-CM | POA: Diagnosis not present

## 2015-09-03 DIAGNOSIS — J90 Pleural effusion, not elsewhere classified: Secondary | ICD-10-CM | POA: Insufficient documentation

## 2015-09-03 DIAGNOSIS — I2510 Atherosclerotic heart disease of native coronary artery without angina pectoris: Secondary | ICD-10-CM | POA: Diagnosis not present

## 2015-09-03 DIAGNOSIS — Z808 Family history of malignant neoplasm of other organs or systems: Secondary | ICD-10-CM | POA: Diagnosis not present

## 2015-09-03 DIAGNOSIS — C7982 Secondary malignant neoplasm of genital organs: Secondary | ICD-10-CM

## 2015-09-03 DIAGNOSIS — K769 Liver disease, unspecified: Secondary | ICD-10-CM | POA: Diagnosis not present

## 2015-09-03 DIAGNOSIS — I10 Essential (primary) hypertension: Secondary | ICD-10-CM | POA: Insufficient documentation

## 2015-09-03 DIAGNOSIS — I82C11 Acute embolism and thrombosis of right internal jugular vein: Secondary | ICD-10-CM | POA: Insufficient documentation

## 2015-09-03 DIAGNOSIS — Z87891 Personal history of nicotine dependence: Secondary | ICD-10-CM | POA: Diagnosis not present

## 2015-09-03 DIAGNOSIS — I252 Old myocardial infarction: Secondary | ICD-10-CM

## 2015-09-03 DIAGNOSIS — I5032 Chronic diastolic (congestive) heart failure: Secondary | ICD-10-CM

## 2015-09-03 DIAGNOSIS — K750 Abscess of liver: Secondary | ICD-10-CM | POA: Insufficient documentation

## 2015-09-03 DIAGNOSIS — C786 Secondary malignant neoplasm of retroperitoneum and peritoneum: Secondary | ICD-10-CM | POA: Insufficient documentation

## 2015-09-03 DIAGNOSIS — Z7901 Long term (current) use of anticoagulants: Secondary | ICD-10-CM | POA: Insufficient documentation

## 2015-09-03 DIAGNOSIS — L8990 Pressure ulcer of unspecified site, unspecified stage: Secondary | ICD-10-CM

## 2015-09-03 DIAGNOSIS — Z9221 Personal history of antineoplastic chemotherapy: Secondary | ICD-10-CM

## 2015-09-03 DIAGNOSIS — C801 Malignant (primary) neoplasm, unspecified: Secondary | ICD-10-CM

## 2015-09-03 DIAGNOSIS — S90821S Blister (nonthermal), right foot, sequela: Secondary | ICD-10-CM | POA: Diagnosis not present

## 2015-09-03 DIAGNOSIS — K319 Disease of stomach and duodenum, unspecified: Secondary | ICD-10-CM

## 2015-09-03 DIAGNOSIS — Z862 Personal history of diseases of the blood and blood-forming organs and certain disorders involving the immune mechanism: Secondary | ICD-10-CM | POA: Diagnosis not present

## 2015-09-03 DIAGNOSIS — S90822S Blister (nonthermal), left foot, sequela: Secondary | ICD-10-CM

## 2015-09-03 DIAGNOSIS — C569 Malignant neoplasm of unspecified ovary: Secondary | ICD-10-CM

## 2015-09-03 DIAGNOSIS — K219 Gastro-esophageal reflux disease without esophagitis: Secondary | ICD-10-CM

## 2015-09-03 DIAGNOSIS — Z9641 Presence of insulin pump (external) (internal): Secondary | ICD-10-CM | POA: Insufficient documentation

## 2015-09-03 DIAGNOSIS — R5383 Other fatigue: Secondary | ICD-10-CM | POA: Diagnosis not present

## 2015-09-03 LAB — CBC WITH DIFFERENTIAL/PLATELET
Basophils Absolute: 0 10*3/uL (ref 0–0.1)
Basophils Relative: 1 %
Eosinophils Absolute: 0 10*3/uL (ref 0–0.7)
Eosinophils Relative: 2 %
HCT: 29.1 % — ABNORMAL LOW (ref 35.0–47.0)
Hemoglobin: 10 g/dL — ABNORMAL LOW (ref 12.0–16.0)
Lymphocytes Relative: 23 %
Lymphs Abs: 0.7 10*3/uL — ABNORMAL LOW (ref 1.0–3.6)
MCH: 30.2 pg (ref 26.0–34.0)
MCHC: 34.5 g/dL (ref 32.0–36.0)
MCV: 87.4 fL (ref 80.0–100.0)
Monocytes Absolute: 0.5 10*3/uL (ref 0.2–0.9)
Monocytes Relative: 16 %
Neutro Abs: 1.7 10*3/uL (ref 1.4–6.5)
Neutrophils Relative %: 58 %
Platelets: 249 10*3/uL (ref 150–440)
RBC: 3.33 MIL/uL — ABNORMAL LOW (ref 3.80–5.20)
RDW: 19.5 % — ABNORMAL HIGH (ref 11.5–14.5)
WBC: 3 10*3/uL — ABNORMAL LOW (ref 3.6–11.0)

## 2015-09-03 LAB — PROTIME-INR
INR: 1.03
Prothrombin Time: 13.7 seconds (ref 11.4–15.0)

## 2015-09-03 LAB — COMPREHENSIVE METABOLIC PANEL
ALT: 18 U/L (ref 14–54)
AST: 17 U/L (ref 15–41)
Albumin: 2.4 g/dL — ABNORMAL LOW (ref 3.5–5.0)
Alkaline Phosphatase: 237 U/L — ABNORMAL HIGH (ref 38–126)
Anion gap: 7 (ref 5–15)
BUN: 10 mg/dL (ref 6–20)
CO2: 27 mmol/L (ref 22–32)
Calcium: 8.6 mg/dL — ABNORMAL LOW (ref 8.9–10.3)
Chloride: 100 mmol/L — ABNORMAL LOW (ref 101–111)
Creatinine, Ser: 0.44 mg/dL (ref 0.44–1.00)
GFR calc Af Amer: >60 mL/min (ref >60)
GFR calc non Af Amer: >60 mL/min (ref >60)
Glucose, Bld: 187 mg/dL — ABNORMAL HIGH (ref 65–99)
Potassium: 3.8 mmol/L (ref 3.5–5.1)
Sodium: 134 mmol/L — ABNORMAL LOW (ref 135–145)
Total Bilirubin: 0.4 mg/dL (ref 0.3–1.2)
Total Protein: 7 g/dL (ref 6.5–8.1)

## 2015-09-03 MED ORDER — ENOXAPARIN SODIUM 60 MG/0.6ML ~~LOC~~ SOLN: 60.0000 mg | Freq: Two times a day (BID) | SUBCUTANEOUS | Status: DC

## 2015-09-03 MED ORDER — RIVAROXABAN 20 MG PO TABS: 20.0000 mg | ORAL_TABLET | Freq: Every day | ORAL | Status: DC

## 2015-09-03 NOTE — Progress Notes (Signed)
[facility] day:  09/03/2015   Chief Complaint: [name] is a 70 y.o. female with bilateral ovarian masses with associated peritoneal metastatic disease who is seen for reassessment after interval [facility] hospitalization.  HPI:  The patient was last seen in the medical oncology clinic on 07/28/2015.  At that time,she was fatigued and had new back pain/right lower lobe discomfort. CXR revealed findings concerning for right-sided empyema.  Chest CT with contrast revealed an empyema and liver abscess.  She was transferred to [facility] for admission.  The patient was admitted to [facility] from 07/29/2015 - 08/10/2015 with a right-sided empyema, liver abscess, fevers and uncontrolled blood sugar. She underwent CT-guided placement of a liver abscess drain on 07/30/2015. Liver abscess culture grew out group B strep and Enterobacter which was sensitive to Zosyn. She was transitioned to ertapenem Colbert Ewing) prior to discharge. Dr. Garry Heater of nfectious disease recommended at least 4 weeks of antibiotics or until radiographic resolution.  Initial imaging on 07/30/2015 revealed a large hepatic fluid and gas collection occupying the majority of the hepatic dome. There was a large right subpulmonic collection with significant amount of gas which appeared to have a small trans-diaphragmatic communication with the hepatic abscesses. There were left upper and lower lobe solid and groundglass pulmonary nodules. Follow-up chest, abdomen, and pelvic CT scan on 08/06/2015 revealed continued interval decrease in the size of the hepatic and adjacent pleural fluid collection. A separate gas containing collection in the right pleural space with a thick rimmed enhancement posteriorly was smaller in size but worrisome for an empyema.  Decision was made to place a drain area on 08/07/2015. Cultures were negative.  The drain was removed on 08/08/2015 secondary to low output. The RUQ drain was  left in place.  Her hospital course was notable for anemia. She received 1 unit of packed red blood cells for hematocrit of 23.1. Creatinine peaked at 2.1, but improved. She developed right upper extremity swelling.  Ultrasound on 08/01/2015 revealed a near occlusive thrombus within the central portion of the right internal jugular vein and central portion of the right subclavian vein. She was initially started on heparin due to acute renal insufficiency with transitioned to Lovenox. She was discharged on Lovenox 60 mg twice a day for 3 months for at least 3 months with plan for reassessment at that time.   Chest, abdomen, and pelvic CT scan on 09/01/2015 revealed continued interval decrease in perihepatic fluid collection contiguous with the right pleural space with percutaneous drain.  There was an interval decrease in the separate loculated right pleural collection.  There was interval increase in left greater than right inguinal lymph nodes.  She was seen by Dr. Theora Gianotti on 09/01/2015.  Drain was removed given the small amount of output for > 2 weeks.  Antibiotics are to continue for 1 additional week with plan for follow-up CT scan.  Symptomatically, she notes a little shortness of breath on exertion.  She is off oxygen.  She has blisters on her heals that are being managed by home health.  She is able to ambulate without a wheelchair.     Past Medical History  Diagnosis Date  . Leukopenia 2012    s/p bone marrow biopsy, Dr. Ma Hillock  . Cholelithiasis   . GERD (gastroesophageal reflux disease)   . Hypertension   . Hyperlipidemia   . Esophageal stricture   . Chronic diastolic CHF (congestive heart failure) ([place])   . CAD (coronary artery  disease)     a. 09/2012 Cath: LM nl, LAD 95p, 108m LCX 956mOM2 50, RCA 100.  . Marland Kitchenypokalemia   . Herniated disc   . Pneumonia 2013; 08/2012    "one lung; double" (10/18/2012)  . Exertional shortness of breath   . [place] I diabetes [place]    "[place]  [place]" (10/18/2012)  . History of pancytopenia   . H/O hiatal hernia   . Rheumatoid arthritis(714.0)   . NSTEMI (non-ST elevated myocardial infarction) ([facility]    "mild" (10/18/2012)  . Collagen vascular disease ([place]  . [facility]    Past Surgical History  Procedure Laterality Date  . Esophagogastroduodenoscopy  2012    Dr. IfMinna Merritts. Tubal ligation  1970  . Cataract extraction w/ intraocular lens implant Right 2010  . Cardiac catheterization  10/18/2012    "first one was today" (10/18/2012)  . Esophageal dilation      "3 or 4 times" (10/19/2011)  . Coronary artery bypass graft N/A 10/19/2012    Procedure: CORONARY ARTERY BYPASS GRAFTING (CABG);  Surgeon: StMelrose NakayamaMD;  [place] Service: Open [facility] Surgery;  Laterality: N/A;  . Peripheral vascular catheterization N/A 03/02/2015    Procedure: Porta Cath Insertion;  Surgeon: JaAlgernon HuxleyMD;  [facility];  Service: Cardiovascular;  Laterality: N/A;  . Abdominal hysterectomy  06/15/2015    Dx L/S, EXLAP TAH BSO omentectomy RSRx colostomy diaphragm resection stripping  . Cholecystectomy  06/15/2015    combined case with ovarian cancer debulking  . Colon surgery    . Ostomy      Family History  Problem Relation Age of Onset  . Diabetes Mother   . Arthritis Mother   . Diabetes Father   . Arthritis Father   . Bone cancer Sister     Social History:  reports that she quit smoking about 21 years ago. Her smoking use included Cigarettes. She has a 30 pack-year smoking history. She has never used smokeless tobacco. She reports that she does not drink alcohol or use illicit drugs.  The patient is accompanied by her husband today.  Allergies:  Allergies  Allergen Reactions  . Codeine Nausea And Vomiting    Current Medications: Current Outpatient Prescriptions  Medication Sig Dispense Refill  . amLODipine (NORVASC) 5 MG tablet Take 5 mg by mouth daily.    . Calcium-Vitamin D  600-200 MG-UNIT tablet Take by mouth.    . enoxaparin (LOVENOX) 60 MG/0.6ML injection Inject into the skin.    . folic acid (FOLVITE) 1 MG tablet TAKE 1 TABLET BY MOUTH ONCE A DAY 90 tablet 3  . furosemide (LASIX) 80 MG tablet     . Insulin Human (INSULIN PUMP) SOLN Pt uses Humalog insulin.    . Marland KitchenNVANZ 1 g injection     . lidocaine-prilocaine (EMLA) cream Apply 1 application topically as needed (prior to accessing port).    . losartan (COZAAR) 100 MG tablet TAKE 1 TABLET BY MOUTH ONCE A DAY 90 tablet 1  . metoprolol tartrate (LOPRESSOR) 25 MG tablet TAKE 1 TABLET BY MOUTH TWICE A DAY AS NEEDED 180 tablet 3  . ranitidine (ZANTAC) 150 MG tablet Take 1 tablet (150 mg total) by mouth 2 (two) times daily. 180 tablet 2  . ondansetron (ZOFRAN) 8 MG tablet Take 1 tablet (8 mg total) by mouth every 8 (eight) hours as needed for nausea or vomiting. (Patient not taking: Reported on  09/03/2015) 20 tablet 1  . oxyCODONE (OXY IR/ROXICODONE) 5 MG immediate release tablet Reported on 09/03/2015    . predniSONE (DELTASONE) 5 MG tablet Reported on 09/03/2015    . zoledronic acid (RECLAST) 5 MG/100ML SOLN Inject 5 mg into the vein See admin instructions. Reported on 09/03/2015     No current facility-administered medications for this visit.   Facility-Administered Medications Ordered in Other Visits  Medication Dose Route Frequency Provider Last Rate Last Dose  . sodium chloride 0.9 % injection 10 mL  10 mL Intracatheter PRN Lequita Asal, MD      . sodium chloride 0.9 % injection 10 mL  10 mL Intravenous PRN Lequita Asal, MD   10 mL at 04/13/15 9371    Review of Systems:  GENERAL:  Fatigued, but greatly improved.  No fevers or sweats.  Weight down 7 pounds since last visit. PERFORMANCE STATUS (ECOG):  3 HEENT:  No visual changes, sore throat, mouth sores or tenderness. Lungs:  Shortness of breath on exertion.  No cough.  No hemoptysis.  Off oxygen. Cardiac:  No chest pain, palpitations, orthopnea, or  PND. GI:  Appetite improvedt.  No constipation, diarrhea, melena or hematochezia. GU:  No urgency, frequency, dysuria, or hematuria. Musculoskeletal:  Severe rheumatoid arthritis.  Osteoporosis.  No back pain. No muscle tenderness. Extremities:  Upper extremity swelling s/p clot, on Lovenox. Skin:  Blisters on heels.  No rashes. Neuro:  Neuropathy s/p Taxol.  No headache, numbness or weakness, balance or coordination issues. Endocrine:  Diabetes.  Insulin pump.  No thyroid issues, hot flashes or night sweats. Psych:  No mood changes, depression or anxiety. Pain:  No pain. Review of systems:  All other systems reviewed and found to be negative.  Physical Exam: Blood pressure 124/63, pulse 84, temperature 96.3 F (35.7 C), temperature source Tympanic, resp. rate 18, height 5' 3"  (1.6 m), weight 134 lb 7.7 oz (61 kg). GENERAL:  Fatigued appearing woman sitting comfortably in a wheelchair in the exam room in no acute distress.   MENTAL STATUS:  Alert and oriented to person, place and time. HEAD:  Short gray hair.  Normocephalic, atraumatic, face symmetric, no Cushingoid features. EYES:  Gold rimmed glasses.  Blue eyes.  No conjunctivitis or scleral icterus.  ENT: Oropharynx clear without lesion. Tongue normal. Mucous membranes moist.  RESPIRATORY: Decreased breath sounds right base with crackles on deep inspiration.  No wheezes or rhonchi. CARDIOVASCULAR: Regular rate and rhythm without murmur, rub or gallop. ABDOMEN: Soft, non-tender with active bowel sounds and no appreciable hepatosplenomegaly. No palpable nodularity or masses. SKIN: Old drain site unremarkable.  No rashes, ulcers, or bruises. EXTREMITIES: Bilateral 1+ lower extremity edema.  No skin discoloration or tenderness. No palpable cords. LYMPH NODES: No palpable cervical, supraclavicular, axillary or inguinal adenopathy  NEUROLOGICAL: Appropriate. PSYCH: Appropriate.  Appointment on 09/03/2015  Component Date Value  Ref Range Status  . WBC 09/03/2015 3.0* 3.6 - 11.0 K/uL Final  . RBC 09/03/2015 3.33* 3.80 - 5.20 MIL/uL Final  . Hemoglobin 09/03/2015 10.0* 12.0 - 16.0 g/dL Final  . HCT 09/03/2015 29.1* 35.0 - 47.0 % Final  . MCV 09/03/2015 87.4  80.0 - 100.0 fL Final  . MCH 09/03/2015 30.2  26.0 - 34.0 pg Final  . MCHC 09/03/2015 34.5  32.0 - 36.0 g/dL Final  . RDW 09/03/2015 19.5* 11.5 - 14.5 % Final  . Platelets 09/03/2015 249  150 - 440 K/uL Final  . Neutrophils Relative % 09/03/2015 58   Final  .  Neutro Abs 09/03/2015 1.7  1.4 - 6.5 K/uL Final  . Lymphocytes Relative 09/03/2015 23   Final  . Lymphs Abs 09/03/2015 0.7* 1.0 - 3.6 K/uL Final  . Monocytes Relative 09/03/2015 16   Final  . Monocytes Absolute 09/03/2015 0.5  0.2 - 0.9 K/uL Final  . Eosinophils Relative 09/03/2015 2   Final  . Eosinophils Absolute 09/03/2015 0.0  0 - 0.7 K/uL Final  . Basophils Relative 09/03/2015 1   Final  . Basophils Absolute 09/03/2015 0.0  0 - 0.1 K/uL Final  . Sodium 09/03/2015 134* 135 - 145 mmol/L Final  . Potassium 09/03/2015 3.8  3.5 - 5.1 mmol/L Final  . Chloride 09/03/2015 100* 101 - 111 mmol/L Final  . CO2 09/03/2015 27  22 - 32 mmol/L Final  . Glucose, Bld 09/03/2015 187* 65 - 99 mg/dL Final  . BUN 09/03/2015 10  6 - 20 mg/dL Final  . Creatinine, Ser 09/03/2015 0.44  0.44 - 1.00 mg/dL Final  . Calcium 09/03/2015 8.6* 8.9 - 10.3 mg/dL Final  . Total Protein 09/03/2015 7.0  6.5 - 8.1 g/dL Final  . Albumin 09/03/2015 2.4* 3.5 - 5.0 g/dL Final  . AST 09/03/2015 17  15 - 41 U/L Final  . ALT 09/03/2015 18  14 - 54 U/L Final  . Alkaline Phosphatase 09/03/2015 237* 38 - 126 U/L Final  . Total Bilirubin 09/03/2015 0.4  0.3 - 1.2 mg/dL Final  . GFR calc non Af Amer 09/03/2015 >60  >60 mL/min Final  . GFR calc Af Amer 09/03/2015 >60  >60 mL/min Final   Comment: (NOTE) The eGFR has been calculated using the CKD EPI equation. This calculation has not been validated in all clinical situations. eGFR's  persistently <60 mL/min signify possible Chronic Kidney Disease.   Georgiann Hahn gap 09/03/2015 7  5 - 15 Final    Assessment:  [name] is a 70 y.o. female with clinical stage IIIC (T3cN1Mx) ovarian cancer presenting with abdominal discomfort and bloating.  Omental biopsy on 02/23/2015 revealed metastatic high grade serous carcinoma, consistent with gynecologic origin.  CA125 was 707 on 02/17/2015.  Abdomen and pelvic CT scan on 02/13/2015 revealed bilateral mass-like adnexal regions (right adnexal mass 5.6 x 5.0 cm and the left adnexa mass 10.3 x 6.0 x 9.9 cm).  There was a large amount of soft tissue throughout the peritoneal cavity involving the omentum and other peritoneal surfaces.  There was a small volume ascites. There was a peripheral 3.3 x 1.9 cm low-attenuation lesion overlying the right lobe of the liver , likely representing a serosal implant. There was 1.4 x 2.2 cm ill-defined peripheral lesion within the inferior aspect of segment 6 adjacent to the ampulla in the duodenum.   She has severe rheumatoid arthritis.  Methotrexate and Enbrel are on hold.  She has a normocytic anemia.  Work-up on 02/17/2015 revealed a normal ferritin, B12, folate, TSH.  She denies any melena or hematochezia.  She has diabetes and is on an insulin pump.  She is status post cycle #4 neoadjuvant carboplatin and Taxol (03/05/2015 - 05/22/2015).  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  She had loose stools on oral magnesium.  She was initially on Neurontin then switched Lyrica with cycle #3.  Cycle #4 was notable for neutropenia (ANC 300) requiring GCSF x 3 days.  CA125 was 802.9 on 03/30/2015, 567.9 on 04/13/2015, 168.8 on 05/15/2015, 85.2 on 07/17/2015, and 68.6 on 07/28/2015..  Abdominal and pelvic CT scan on 04/28/2015  revealed decreasing bilateral ovarian masses.   The left adnexal mass measured 4.0 x 6.1 cm (previously 5.0 x 8.5 cm). The right ovary measured 3.8 x 4.9 cm (previously 4.7 x 5.6 cm).   There was improved peritoneal carcinomatosis.  There was a small amount of ascites.  The hepatic dome lesion was stable. The previously seen right hepatic lobe lesion was not well-visualized.  There was a small left pleural effusion and trace right pleural effusion.  The nodular lesion at the ampulla of Vater, extending into the duodenum was stable.  There was no biliary ductal dilatation.  She underwent exploratory laparotomy, lysis of adhesions, total abdominal hysterectomy with bilateral salpingo-oophorectomy, infracolic omentectomy, optimal tumor debulking(< 1 cm), recto-sigmoid resection with creation of end colostomy, cholecystectomy, mobilization of splenic flexure and liver with diaphragmatic stripping on 06/15/2015. The right diaphragm was cleared of tumor. During dissection, the diaphragm was entered and closed with sutures.   She has had a recurrent right sided pleural effusion.  She underwent thoracentesis of 650 cc on post-operative day 3.  She was admitted to [facility] on 07/01/2015 and 07/07/2015 for recurrent shortness of breath.  She has undergone 2 additional thoracentesis (1.1 L on 07/02/2015 and 850 cc on 07/08/2015).  Cytology was negative x 2.  Bilateral lower extremity duplex on 07/03/2015 was negative.  Echo on 07/08/2015 revealed en EF of 55-60%.  She is now on oxygen 2 liter/min via [place].  She has anemia likely due to chronic disease.  She received 1 unit PRBCs during her recent admission to [facility].  She denies any melena or hematochezia.  Diet is modest, but improving.  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  She is on Lovenox 60 mg twice a day.   She was admitted to [facility] from 07/29/2015 - 08/10/2015 with a right-sided empyema and liver abscess.  She underwent CT-guided placement of a liver abscess drain on 07/30/2015. Liver abscess  culture grew out group B strep and Enterobacter which was sensitive to Zosyn. She was transitioned to ertapenem Colbert Ewing) prior to discharge.   Chest, abdomen, and pelvic CT scan on 09/01/2015 revealed continued interval decrease in perihepatic fluid collection contiguous with the right pleural space with percutaneous drain.  There was an interval decrease in the separate loculated right pleural collection.  There was interval increase in left greater than right inguinal lymph nodes.  Drain was removed on 09/01/2015.  Symptomatically, she notes a little shortness of breath on exertion.  She is off oxygen.  She has blisters on her heals that are being managed by home health.  She is able to ambulate without a wheelchair.    Plan: 1.  Review interval hospitalization records.  Discuss plan to complete antibiotics and ensure abscesses have resolved prior to reinitiation of chemotherapy. 2.  Discuss anticoagulation for at least 3 months.  Discuss benefit of Lovenox.  Discuss Xarelto vs Eliquis.  Follow-up with hematology at [facility]. 3.  Follow-up with Elease Etienne, social worker, cost of anticoagulants. 4.  Refill Lovenox 60 mg SQ BID. 5.  Continue Invanz. 6.  Discuss caloric intake. 7.  RTC in 2 weeks for MD assess, labs (CBC with diff, ferritin, iron studies, B12, folate, CA125).   Lequita Asal, MD  09/03/2015, 10:40 AM

## 2015-09-03 NOTE — Progress Notes (Signed)
Pts weight has decreased since last visit.  Pt concerned if she can take anything else besides lovenox. 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 Pt reports that she has blisters on heels that home health is taking care of.  Pt now walking not using wheelchair.  Pt had drain removed and reports it looks good also.  No other concerns. CT at [facility] last Tuesday May 2nd at [facility].  Been off oxygen for almost 23 weeks unless she needs it.

## 2015-09-08 ENCOUNTER — Encounter: Payer: Medicare Other | Admitting: Physical Therapy

## 2015-09-10 ENCOUNTER — Encounter: Payer: Medicare Other | Admitting: Physical Therapy

## 2015-09-10 ENCOUNTER — Other Ambulatory Visit: Payer: Self-pay | Admitting: Hematology and Oncology

## 2015-09-10 DIAGNOSIS — C786 Secondary malignant neoplasm of retroperitoneum and peritoneum: Secondary | ICD-10-CM

## 2015-09-10 DIAGNOSIS — K750 Abscess of liver: Secondary | ICD-10-CM

## 2015-09-10 DIAGNOSIS — C569 Malignant neoplasm of unspecified ovary: Secondary | ICD-10-CM

## 2015-09-10 DIAGNOSIS — L8990 Pressure ulcer of unspecified site, unspecified stage: Secondary | ICD-10-CM

## 2015-09-10 DIAGNOSIS — C801 Malignant (primary) neoplasm, unspecified: Secondary | ICD-10-CM

## 2015-09-10 DIAGNOSIS — I82C11 Acute embolism and thrombosis of right internal jugular vein: Secondary | ICD-10-CM

## 2015-09-10 MED ORDER — ENOXAPARIN SODIUM 60 MG/0.6ML ~~LOC~~ SOLN: 60.0000 mg | Freq: Two times a day (BID) | SUBCUTANEOUS | Status: DC

## 2015-09-14 ENCOUNTER — Encounter: Payer: Medicare Other | Admitting: Physical Therapy

## 2015-09-16 ENCOUNTER — Encounter: Payer: Medicare Other | Admitting: Physical Therapy

## 2015-09-17 ENCOUNTER — Inpatient Hospital Stay: Payer: Medicare Other

## 2015-09-17 ENCOUNTER — Inpatient Hospital Stay (HOSPITAL_BASED_OUTPATIENT_CLINIC_OR_DEPARTMENT_OTHER): Payer: Medicare Other | Admitting: Hematology and Oncology

## 2015-09-17 VITALS — BP 105/66 | HR 78 | Temp 94.2°F | Resp 18 | Ht 63.0 in | Wt 127.8 lb

## 2015-09-17 DIAGNOSIS — C801 Malignant (primary) neoplasm, unspecified: Secondary | ICD-10-CM

## 2015-09-17 DIAGNOSIS — K750 Abscess of liver: Secondary | ICD-10-CM

## 2015-09-17 DIAGNOSIS — R5383 Other fatigue: Secondary | ICD-10-CM

## 2015-09-17 DIAGNOSIS — D649 Anemia, unspecified: Secondary | ICD-10-CM

## 2015-09-17 DIAGNOSIS — C569 Malignant neoplasm of unspecified ovary: Secondary | ICD-10-CM

## 2015-09-17 DIAGNOSIS — Z862 Personal history of diseases of the blood and blood-forming organs and certain disorders involving the immune mechanism: Secondary | ICD-10-CM

## 2015-09-17 DIAGNOSIS — K219 Gastro-esophageal reflux disease without esophagitis: Secondary | ICD-10-CM

## 2015-09-17 DIAGNOSIS — I5032 Chronic diastolic (congestive) heart failure: Secondary | ICD-10-CM

## 2015-09-17 DIAGNOSIS — C7982 Secondary malignant neoplasm of genital organs: Secondary | ICD-10-CM | POA: Diagnosis not present

## 2015-09-17 DIAGNOSIS — I252 Old myocardial infarction: Secondary | ICD-10-CM

## 2015-09-17 DIAGNOSIS — L8990 Pressure ulcer of unspecified site, unspecified stage: Secondary | ICD-10-CM

## 2015-09-17 DIAGNOSIS — D709 Neutropenia, unspecified: Secondary | ICD-10-CM

## 2015-09-17 DIAGNOSIS — Z9641 Presence of insulin pump (external) (internal): Secondary | ICD-10-CM

## 2015-09-17 DIAGNOSIS — E119 Type 2 diabetes mellitus without complications: Secondary | ICD-10-CM

## 2015-09-17 DIAGNOSIS — R634 Abnormal weight loss: Secondary | ICD-10-CM | POA: Insufficient documentation

## 2015-09-17 DIAGNOSIS — E785 Hyperlipidemia, unspecified: Secondary | ICD-10-CM

## 2015-09-17 DIAGNOSIS — I82C11 Acute embolism and thrombosis of right internal jugular vein: Secondary | ICD-10-CM

## 2015-09-17 DIAGNOSIS — Z87891 Personal history of nicotine dependence: Secondary | ICD-10-CM

## 2015-09-17 DIAGNOSIS — C786 Secondary malignant neoplasm of retroperitoneum and peritoneum: Secondary | ICD-10-CM

## 2015-09-17 DIAGNOSIS — M069 Rheumatoid arthritis, unspecified: Secondary | ICD-10-CM

## 2015-09-17 DIAGNOSIS — I998 Other disorder of circulatory system: Secondary | ICD-10-CM

## 2015-09-17 DIAGNOSIS — Z9221 Personal history of antineoplastic chemotherapy: Secondary | ICD-10-CM

## 2015-09-17 DIAGNOSIS — Z794 Long term (current) use of insulin: Secondary | ICD-10-CM

## 2015-09-17 DIAGNOSIS — I2510 Atherosclerotic heart disease of native coronary artery without angina pectoris: Secondary | ICD-10-CM

## 2015-09-17 DIAGNOSIS — K769 Liver disease, unspecified: Secondary | ICD-10-CM

## 2015-09-17 DIAGNOSIS — J90 Pleural effusion, not elsewhere classified: Secondary | ICD-10-CM

## 2015-09-17 DIAGNOSIS — Z90722 Acquired absence of ovaries, bilateral: Secondary | ICD-10-CM

## 2015-09-17 DIAGNOSIS — K319 Disease of stomach and duodenum, unspecified: Secondary | ICD-10-CM

## 2015-09-17 DIAGNOSIS — Z808 Family history of malignant neoplasm of other organs or systems: Secondary | ICD-10-CM

## 2015-09-17 DIAGNOSIS — E876 Hypokalemia: Secondary | ICD-10-CM

## 2015-09-17 DIAGNOSIS — Z7901 Long term (current) use of anticoagulants: Secondary | ICD-10-CM

## 2015-09-17 DIAGNOSIS — I10 Essential (primary) hypertension: Secondary | ICD-10-CM

## 2015-09-17 DIAGNOSIS — Z90710 Acquired absence of both cervix and uterus: Secondary | ICD-10-CM

## 2015-09-17 LAB — COMPREHENSIVE METABOLIC PANEL
ALT: 13 U/L — ABNORMAL LOW (ref 14–54)
AST: 16 U/L (ref 15–41)
Albumin: 2.8 g/dL — ABNORMAL LOW (ref 3.5–5.0)
Alkaline Phosphatase: 110 U/L (ref 38–126)
Anion gap: 5 (ref 5–15)
BUN: 16 mg/dL (ref 6–20)
CO2: 29 mmol/L (ref 22–32)
Calcium: 8.7 mg/dL — ABNORMAL LOW (ref 8.9–10.3)
Chloride: 100 mmol/L — ABNORMAL LOW (ref 101–111)
Creatinine, Ser: 0.63 mg/dL (ref 0.44–1.00)
GFR calc Af Amer: >60 mL/min (ref >60)
GFR calc non Af Amer: >60 mL/min (ref >60)
Glucose, Bld: 99 mg/dL (ref 65–99)
Potassium: 3.9 mmol/L (ref 3.5–5.1)
Sodium: 134 mmol/L — ABNORMAL LOW (ref 135–145)
Total Bilirubin: 0.5 mg/dL (ref 0.3–1.2)
Total Protein: 7.6 g/dL (ref 6.5–8.1)

## 2015-09-17 LAB — CBC WITH DIFFERENTIAL/PLATELET
Basophils Absolute: 0 10*3/uL (ref 0–0.1)
Basophils Relative: 2 %
Eosinophils Absolute: 0.1 10*3/uL (ref 0–0.7)
Eosinophils Relative: 3 %
HCT: 31.1 % — ABNORMAL LOW (ref 35.0–47.0)
Hemoglobin: 10.6 g/dL — ABNORMAL LOW (ref 12.0–16.0)
Lymphocytes Relative: 37 %
Lymphs Abs: 0.8 10*3/uL — ABNORMAL LOW (ref 1.0–3.6)
MCH: 29.6 pg (ref 26.0–34.0)
MCHC: 34.1 g/dL (ref 32.0–36.0)
MCV: 86.6 fL (ref 80.0–100.0)
Monocytes Absolute: 0.4 10*3/uL (ref 0.2–0.9)
Monocytes Relative: 19 %
Neutro Abs: 0.8 10*3/uL — ABNORMAL LOW (ref 1.4–6.5)
Neutrophils Relative %: 39 %
Platelets: 282 10*3/uL (ref 150–440)
RBC: 3.59 MIL/uL — ABNORMAL LOW (ref 3.80–5.20)
RDW: 16.7 % — ABNORMAL HIGH (ref 11.5–14.5)
WBC: 2 10*3/uL — ABNORMAL LOW (ref 3.6–11.0)

## 2015-09-17 LAB — IRON AND TIBC
Iron: 30 ug/dL (ref 28–170)
Saturation Ratios: 12 % (ref 10.4–31.8)
TIBC: 260 ug/dL (ref 250–450)
UIBC: 230 ug/dL

## 2015-09-17 LAB — VITAMIN B12: Vitamin B-12: 316 pg/mL (ref 180–914)

## 2015-09-17 LAB — TSH: TSH: 10.71 u[IU]/mL — ABNORMAL HIGH (ref 0.350–4.500)

## 2015-09-17 LAB — MAGNESIUM: Magnesium: 1.8 mg/dL (ref 1.7–2.4)

## 2015-09-17 LAB — FOLATE: Folate: 40 ng/mL (ref >5.9)

## 2015-09-17 LAB — FERRITIN: Ferritin: 72 ng/mL (ref 11–307)

## 2015-09-17 NOTE — Progress Notes (Signed)
[place] [facility] day:  09/17/2015   Chief Complaint: [name] is a 70 y.o. female with bilateral ovarian masses with associated peritoneal metastatic disease, right sided empyema, liver abscess, and right IJ thrombus who is seen for 2 week assessment.  HPI:  The patient was last seen in the medical oncology clinic on 09/03/2015.  At that time, she was seen for initial assessment after a 13 day hospitalization at [facility].  She underwent CT-guided placement of a liver abscess drain. Liver abscess culture grew out group B strep and Enterobacter which was sensitive to Zosyn. She was transitioned to ertapenem Colbert Ewing).   Chest, abdomen, and pelvic CT scan on 09/01/2015 revealed continued interval decrease in perihepatic fluid collection contiguous with the right pleural space with percutaneous drain. There was an interval decrease in the separate loculated right pleural collection. There was interval increase in left greater than right inguinal lymph nodes. Drain was removed on 09/01/2015.  She has continued her antibiotics except for a brief period over the weekend.  She has had no fevers.  She continues her Lovenox.  She is eating well, but continues to lose weight.  Chest, abdomen, and pelvic CT scan on 09/11/2015 noted interval removal of percutaneous drain which was posterior to the right hepatic lobe. There was a small residual versus recurrent fluid collection in that location (2.6 x 1.1 x 4.0 cm).  There was continued interval decrease in a separate loculated right pleural collection (1.5 x 0.8 cm), likely improving empyema.  There was stable small right pleural effusion with associated atelectasis.  There was decreasing small left pleural effusion.  There was stable mild mediastinal and hilar lymphadenopathy, likely reactive.   There was stable mild inguinal lymphadenopathy.   Past Medical History  Diagnosis Date  . Leukopenia 2012    s/p bone  marrow biopsy, Dr. Ma Hillock  . Cholelithiasis   . GERD (gastroesophageal reflux disease)   . Hypertension   . Hyperlipidemia   . Esophageal stricture   . Chronic diastolic CHF (congestive heart failure) ([place])   . CAD (coronary artery disease)     a. 09/2012 Cath: LM nl, LAD 95p, 34m LCX 958mOM2 50, RCA 100.  . Marland Kitchenypokalemia   . Herniated disc   . Pneumonia 2013; 08/2012    "one lung; double" (10/18/2012)  . Exertional shortness of breath   . Type I diabetes mellitus ([facility]    "dx'd in 1957" (10/18/2012)  . History of pancytopenia   . H/O hiatal hernia   . Rheumatoid arthritis(714.0)   . NSTEMI (non-ST elevated myocardial infarction) ([facility]    "mild" (10/18/2012)  . Collagen vascular disease ([facility]  . [facility]    Past Surgical History  Procedure Laterality Date  . Esophagogastroduodenoscopy  2012    Dr. IfMinna Merritts. Tubal ligation  1970  . Cataract extraction w/ intraocular lens implant Right 2010  . Cardiac catheterization  10/18/2012    "first one was today" (10/18/2012)  . Esophageal dilation      "3 or 4 times" (10/19/2011)  . Coronary artery bypass graft N/A 10/19/2012    Procedure: CORONARY ARTERY BYPASS GRAFTING (CABG);  Surgeon: StMelrose NakayamaMD;  [place] Service: Open [facility] Surgery;  Laterality: N/A;  . Peripheral vascular catheterization N/A 03/02/2015    Procedure: Porta Cath Insertion;  Surgeon: JaAlgernon HuxleyMD;  [place] [facility];  Service: Cardiovascular;  Laterality: N/A;  .  Abdominal hysterectomy  06/15/2015    Dx L/S, EXLAP TAH BSO omentectomy RSRx colostomy diaphragm resection stripping  . Cholecystectomy  06/15/2015    combined case with ovarian cancer debulking  . Colon surgery    . Ostomy      Family History  Problem Relation Age of Onset  . Diabetes Mother   . Arthritis Mother   . Diabetes Father   . Arthritis Father   . Bone cancer Sister     Social History:  reports that she quit smoking about 21 years  ago. Her smoking use included Cigarettes. She has a 30 pack-year smoking history. She has never used smokeless tobacco. She reports that she does not drink alcohol or use illicit drugs.  The patient is accompanied by her husband today.  Allergies:  Allergies  Allergen Reactions  . Codeine Nausea And Vomiting    Current Medications: Current Outpatient Prescriptions  Medication Sig Dispense Refill  . amLODipine (NORVASC) 5 MG tablet Take 5 mg by mouth daily.    . Calcium-Vitamin D 600-200 MG-UNIT tablet Take by mouth.    . enoxaparin (LOVENOX) 60 MG/0.6ML injection Inject 0.6 mLs (60 mg total) into the skin every 12 (twelve) hours. 60 Syringe 2  . folic acid (FOLVITE) 1 MG tablet TAKE 1 TABLET BY MOUTH ONCE A DAY 90 tablet 3  . furosemide (LASIX) 80 MG tablet     . Insulin Human (INSULIN PUMP) SOLN Pt uses Humalog insulin.    Marland Kitchen INVANZ 1 g injection     . lidocaine-prilocaine (EMLA) cream Apply 1 application topically as needed (prior to accessing port).    . losartan (COZAAR) 100 MG tablet TAKE 1 TABLET BY MOUTH ONCE A DAY 90 tablet 1  . metoprolol tartrate (LOPRESSOR) 25 MG tablet TAKE 1 TABLET BY MOUTH TWICE A DAY AS NEEDED 180 tablet 3  . ondansetron (ZOFRAN) 8 MG tablet Take 1 tablet (8 mg total) by mouth every 8 (eight) hours as needed for nausea or vomiting. 20 tablet 1  . oxyCODONE (OXY IR/ROXICODONE) 5 MG immediate release tablet Reported on 09/03/2015    . ranitidine (ZANTAC) 150 MG tablet Take 1 tablet (150 mg total) by mouth 2 (two) times daily. 180 tablet 2  . zoledronic acid (RECLAST) 5 MG/100ML SOLN Inject 5 mg into the vein See admin instructions. Reported on 09/03/2015    . predniSONE (DELTASONE) 5 MG tablet Reported on 09/17/2015     No current facility-administered medications for this visit.   Facility-Administered Medications Ordered in Other Visits  Medication Dose Route Frequency Provider Last Rate Last Dose  . sodium chloride 0.9 % injection 10 mL  10 mL Intracatheter  PRN Lequita Asal, MD      . sodium chloride 0.9 % injection 10 mL  10 mL Intravenous PRN Lequita Asal, MD   10 mL at 04/13/15 9381    Review of Systems:  GENERAL:  Fatigue.  No fever, chills or sweats.  Weight down 20 pounds since 07/28/2015.  Ambulating with a cane. PERFORMANCE STATUS (ECOG):  2 HEENT:  No visual changes, sore throat, mouth sores or tenderness. Lungs:  Shortness of breath ,minimal.  No cough.  No hemoptysis. Cardiac:  No chest pain, palpitations, orthopnea, or PND. GI:  Appetite good.  Eating well.  No constipation, diarrhea, melena or hematochezia. GU:  No urgency, frequency, dysuria, or hematuria. Musculoskeletal:  Severe rheumatoid arthritis.  Osteoporosis.  No back pain. No muscle tenderness. Extremities:  No pain or swelling. Skin:  No rashes or skin changes. Neuro:  Neuropathy.  No headache, numbness or weakness, balance or coordination issues. Endocrine:  Diabetes.  Insulin pump.  No thyroid issues, hot flashes or night sweats. Psych:  No mood changes, depression or anxiety. Pain: No pain. Review of systems:  All other systems reviewed and found to be negative.  Physical Exam: Blood pressure 105/66, pulse 78, temperature 94.2 F (34.6 C), temperature source Tympanic, resp. rate 18, height 5' 3"  (1.6 m), weight 127 lb 12.1 oz (57.95 kg). GENERAL:  Chronically fatigued, obviously thinner woman sitting comfortably in a wheelchair in the exam room in no acute distress.   MENTAL STATUS:  Alert and oriented to person, place and time. HEAD:  Short gray hair.  Normocephalic, atraumatic, face symmetric, no Cushingoid features. EYES:  Gold rimmed glasses.  Blue eyes.  No conjunctivitis or scleral icterus.  ENT: Oropharynx clear without lesion. Tongue normal. Mucous membranes moist.  RESPIRATORY: Decreased breath sounds right base.  No rales, wheezes or rhonchi. CARDIOVASCULAR: Regular rate and rhythm without murmur, rub or gallop. ABDOMEN: Soft,  non-tender with active bowel sounds and no appreciable hepatosplenomegaly. No palpable nodularity or masses. BACK:  Pain on deep palpation right lower chest wall. SKIN: No rashes. EXTREMITIES: Trace lower extremity edema.  No skin discoloration or tenderness. No palpable cords. LYMPH NODES: No palpable cervical, supraclavicular, axillary or inguinal adenopathy  NEUROLOGICAL: Appropriate. PSYCH: Appropriate.  Appointment on 09/17/2015  Component Date Value Ref Range Status  . WBC 09/17/2015 2.0* 3.6 - 11.0 K/uL Final  . RBC 09/17/2015 3.59* 3.80 - 5.20 MIL/uL Final  . Hemoglobin 09/17/2015 10.6* 12.0 - 16.0 g/dL Final  . HCT 09/17/2015 31.1* 35.0 - 47.0 % Final  . MCV 09/17/2015 86.6  80.0 - 100.0 fL Final  . MCH 09/17/2015 29.6  26.0 - 34.0 pg Final  . MCHC 09/17/2015 34.1  32.0 - 36.0 g/dL Final  . RDW 09/17/2015 16.7* 11.5 - 14.5 % Final  . Platelets 09/17/2015 282  150 - 440 K/uL Final  . Neutrophils Relative % 09/17/2015 39   Final  . Neutro Abs 09/17/2015 0.8* 1.4 - 6.5 K/uL Final  . Lymphocytes Relative 09/17/2015 37   Final  . Lymphs Abs 09/17/2015 0.8* 1.0 - 3.6 K/uL Final  . Monocytes Relative 09/17/2015 19   Final  . Monocytes Absolute 09/17/2015 0.4  0.2 - 0.9 K/uL Final  . Eosinophils Relative 09/17/2015 3   Final  . Eosinophils Absolute 09/17/2015 0.1  0 - 0.7 K/uL Final  . Basophils Relative 09/17/2015 2   Final  . Basophils Absolute 09/17/2015 0.0  0 - 0.1 K/uL Final  . Magnesium 09/17/2015 1.8  1.7 - 2.4 mg/dL Final  . Sodium 09/17/2015 134* 135 - 145 mmol/L Final  . Potassium 09/17/2015 3.9  3.5 - 5.1 mmol/L Final  . Chloride 09/17/2015 100* 101 - 111 mmol/L Final  . CO2 09/17/2015 29  22 - 32 mmol/L Final  . Glucose, Bld 09/17/2015 99  65 - 99 mg/dL Final  . BUN 09/17/2015 16  6 - 20 mg/dL Final  . Creatinine, Ser 09/17/2015 0.63  0.44 - 1.00 mg/dL Final  . Calcium 09/17/2015 8.7* 8.9 - 10.3 mg/dL Final  . Total Protein 09/17/2015 7.6  6.5 - 8.1 g/dL Final   . Albumin 09/17/2015 2.8* 3.5 - 5.0 g/dL Final  . AST 09/17/2015 16  15 - 41 U/L Final  . ALT 09/17/2015 13* 14 - 54 U/L Final  . Alkaline Phosphatase 09/17/2015 110  38 -  126 U/L Final  . Total Bilirubin 09/17/2015 0.5  0.3 - 1.2 mg/dL Final  . GFR calc non Af Amer 09/17/2015 >60  >60 mL/min Final  . GFR calc Af Amer 09/17/2015 >60  >60 mL/min Final   Comment: (NOTE) The eGFR has been calculated using the CKD EPI equation. This calculation has not been validated in all clinical situations. eGFR's persistently <60 mL/min signify possible Chronic Kidney Disease.   Georgiann Hahn gap 09/17/2015 5  5 - 15 Final    Assessment:  REATHA SUR is a 70 y.o. female with clinical stage IIIC (T3cN1Mx) ovarian cancer presenting with abdominal discomfort and bloating.  Omental biopsy on 02/23/2015 revealed metastatic high grade serous carcinoma, consistent with gynecologic origin.  CA125 was 707 on 02/17/2015.  Abdomen and pelvic CT scan on 02/13/2015 revealed bilateral mass-like adnexal regions (right adnexal mass 5.6 x 5.0 cm and the left adnexa mass 10.3 x 6.0 x 9.9 cm).  There was a large amount of soft tissue throughout the peritoneal cavity involving the omentum and other peritoneal surfaces.  There was a small volume ascites. There was a peripheral 3.3 x 1.9 cm low-attenuation lesion overlying the right lobe of the liver , likely representing a serosal implant. There was 1.4 x 2.2 cm ill-defined peripheral lesion within the inferior aspect of segment 6 adjacent to the ampulla in the duodenum.   She has severe rheumatoid arthritis.  Methotrexate and Enbrel are on hold.  She has a normocytic anemia.  Work-up on 02/17/2015 revealed a normal ferritin, B12, folate, TSH.  She denies any melena or hematochezia.  She has diabetes and is on an insulin pump.  She is status post cycle #4 neoadjuvant carboplatin and Taxol (03/05/2015 - 05/22/2015).  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  She had loose  stools on oral magnesium.  She was initially on Neurontin then switched Lyrica with cycle #3.  Cycle #4 was notable for neutropenia (ANC 300) requiring GCSF x 3 days.  CA125 was 802.9 on 03/30/2015, 567.9 on 04/13/2015, 168.8 on 05/15/2015, 85.2 on 07/17/2015, and 68.6 on 07/28/2015..  Abdominal and pelvic CT scan on 04/28/2015 revealed decreasing bilateral ovarian masses.   The left adnexal mass measured 4.0 x 6.1 cm (previously 5.0 x 8.5 cm). The right ovary measured 3.8 x 4.9 cm (previously 4.7 x 5.6 cm).  There was improved peritoneal carcinomatosis.  There was a small amount of ascites.  The hepatic dome lesion was stable. The previously seen right hepatic lobe lesion was not well-visualized.  There was a small left pleural effusion and trace right pleural effusion.  The nodular lesion at the ampulla of Vater, extending into the duodenum was stable.  There was no biliary ductal dilatation.  She underwent exploratory laparotomy, lysis of adhesions, total abdominal hysterectomy with bilateral salpingo-oophorectomy, infracolic omentectomy, optimal tumor debulking(< 1 cm), recto-sigmoid resection with creation of end colostomy, cholecystectomy, mobilization of splenic flexure and liver with diaphragmatic stripping on 06/15/2015. The right diaphragm was cleared of tumor. During dissection, the diaphragm was entered and closed with sutures.   She has had a recurrent right sided pleural effusion.  She underwent thoracentesis of 650 cc on post-operative day 3.  She was admitted to [facility] on 07/01/2015 and 07/07/2015 for recurrent shortness of breath.  She has undergone 2 additional thoracentesis (1.1 L on 07/02/2015 and 850 cc on 07/08/2015).  Cytology was negative x 2.  Bilateral lower extremity duplex on 07/03/2015 was negative.  Echo on 07/08/2015 revealed en  EF of 55-60%.    She has anemia likely due to chronic disease. She received 1 unit PRBCs during her recent admission to [facility]. She denies any melena  or hematochezia. Diet is modest, but improving.  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 She is on Lovenox 60 mg twice a day.   She was admitted to [facility] from 07/29/2015 - 08/10/2015 with a right-sided empyema and liver abscess. She underwent CT-guided placement of a liver abscess drain on 07/30/2015. Liver abscess culture grew out group B strep and Enterobacter which was sensitive to Zosyn. She was transitioned to ertapenem Colbert Ewing) prior to discharge.   Chest, abdomen, and pelvic CT scan on 09/01/2015 revealed continued decrease in perihepatic fluid collection contiguous with the right pleural space with percutaneous drain. Drain was removed on 09/01/2015.  Chest, abdomen, and pelvic CT scan on 09/11/2015 revealed a residual versus recurrent fluid collection posterior to the right hepatic lobe (2.6 x 1.1 x 4.0 cm).   Symptomatically, she remains fatigued.  She has lost 20 pounds since 07/28/2015.  She is eating well.  She is neutropenic ([place] 800).   Plan: 1.  Labs today:  CBC with diff, CMP, Mg, ferritin, iron studies, B12, folate, TSH, CA125. 2.  Discuss concern for neutropenia and enlarging fluid collection. 3.  Phone follow-up with Dr. Garry Heater.  Discuss plan for drainage (interventional radiology verus surgery).  She requested patient return to [facility] for admission. 4.  Discuss admission to [facility].  Patient and her husband are in agreement. 5.  Patient to contact clinic after discharge from [facility].   Lequita Asal, MD  09/17/2015, 3:21 PM

## 2015-09-17 NOTE — Progress Notes (Signed)
Pt continues to lose weight.  Pt and husband reports she feels better and she now ambulates with just cane.  Husband concerned about how much intestines were removed and how much food was being absorbed.  Pt and husband report that they concerned about being told there was fluid around the liver.  Where is fluid coming form?  Dr. Roxy Horseman office per pt stated that the abscess has gone. They are concerned about not hearing anything and the possible re-insertion of the drain for fluid that infectious disease stated she has now.  Infectious disease MD is: Dr. Jari Sportsman, MD general appt [phone]  # to to be able to speak with MD [phone]

## 2015-09-18 ENCOUNTER — Other Ambulatory Visit: Payer: Self-pay

## 2015-09-18 DIAGNOSIS — C569 Malignant neoplasm of unspecified ovary: Secondary | ICD-10-CM

## 2015-09-18 LAB — CA 125: CA 125: 34.5 U/mL (ref 0.0–38.1)

## 2015-09-18 LAB — T4, FREE: Free T4: 0.99 ng/dL (ref 0.61–1.12)

## 2015-09-19 ENCOUNTER — Encounter: Payer: Self-pay | Admitting: Hematology and Oncology

## 2015-09-21 ENCOUNTER — Encounter: Payer: Medicare Other | Admitting: Physical Therapy

## 2015-09-23 ENCOUNTER — Encounter: Payer: Medicare Other | Admitting: Physical Therapy

## 2015-09-30 ENCOUNTER — Other Ambulatory Visit: Payer: Self-pay

## 2015-09-30 ENCOUNTER — Other Ambulatory Visit: Payer: Self-pay | Admitting: Internal Medicine

## 2015-09-30 MED ORDER — INSULIN LISPRO 100 UNIT/ML ~~LOC~~ SOLN: 6.0000 [IU] | Freq: Every day | SUBCUTANEOUS | Status: DC

## 2015-10-14 ENCOUNTER — Ambulatory Visit: Payer: Medicare Other

## 2015-10-23 ENCOUNTER — Encounter: Payer: Self-pay | Admitting: Cardiovascular Disease

## 2015-10-23 ENCOUNTER — Ambulatory Visit (INDEPENDENT_AMBULATORY_CARE_PROVIDER_SITE_OTHER): Payer: Medicare Other | Admitting: Cardiovascular Disease

## 2015-10-23 VITALS — BP 112/50 | HR 73 | Ht 63.0 in | Wt 126.5 lb

## 2015-10-23 DIAGNOSIS — J90 Pleural effusion, not elsewhere classified: Secondary | ICD-10-CM

## 2015-10-23 DIAGNOSIS — E785 Hyperlipidemia, unspecified: Secondary | ICD-10-CM | POA: Diagnosis not present

## 2015-10-23 DIAGNOSIS — E1059 Type 1 diabetes mellitus with other circulatory complications: Secondary | ICD-10-CM

## 2015-10-23 DIAGNOSIS — I25810 Atherosclerosis of coronary artery bypass graft(s) without angina pectoris: Secondary | ICD-10-CM | POA: Diagnosis not present

## 2015-10-23 DIAGNOSIS — I10 Essential (primary) hypertension: Secondary | ICD-10-CM

## 2015-10-23 DIAGNOSIS — C569 Malignant neoplasm of unspecified ovary: Secondary | ICD-10-CM

## 2015-10-23 NOTE — Progress Notes (Signed)
Patient ID: [name], female   DOB: 07/03/1945, 70 y.o.   MRN: [id number] Cardiology Office Note  Date:  10/23/2015   ID:  Janet, [name] [birth year], MRN [id number]  PCP:  Rica Mast, MD   Chief Complaint  Patient presents with  . other    6 month f/u c/o low BP. Meds reviewed verbally with pt.    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 She had spiking fevers, chills, severe shortness of breath. Sugars were in the 400s. She had a long course of antibiotics to her hospital visit. Secondary to significant cough, she was unable to lie flat for cardiac catheterization. She declined cardiac catheterization on the same hospital visit. On admission to the hospital, her immunosuppressants, Enbrel and methotrexate were held She had cardiac catheterization in followup after her pneumonia resolved. This showed severe three-vessel CAD. She was transferred from [facility] to [place] for bypass surgery. She had four-vessel bypass surgery with LIMA to the LAD, saphenous vein graft to the diagonal, vein graft to the OM, vein graft to the PDA. She reports relatively uneventful surgery. She was admitted on June 19, discharge June 24.  She presents today for follow-up of her coronary disease, history of bypass  Since her last clinic visit, she has been diagnosed with clinical stage IIIC (T3cN1Mx) ovarian cancer presenting with abdominal discomfort and bloating. Omental biopsy on 02/23/2015 revealed metastatic high grade serous carcinoma, consistent with gynecologic origin. CA125 was 707 on 02/17/2015.  Abdomen and pelvic CT scan on 02/13/2015 revealed bilateral  mass-like adnexal regions (right adnexal mass 5.6 x 5.0 cm and the left adnexa mass 10.3 x 6.0 x 9.9 cm).  She underwent chemo x 4 Surgery at [facility], with surgical debulking, resection of part of large  intestines, spot on liver (scaped it)  Developed Liver infection/abscess, put in drain, grew group B strep and enterobacter abx on ABX 8 days at [facility] IV infusion ABX , changed to pill,  Drain was removed on 09/01/2015.  Port on right, developed clot 3/17, now on lovenox BID Arm was swelling Pleural effusion  Lost weight, down 40 pounds since 10/16  Echo in 2/17, 3/17  Weight has stabilized, albumin up to 2.8 from 2.0 Starting to feel stronger, trying to get ready for 2 more rounds of chemotherapy Denies any recent cardiac complications  EKG on today's visit shows normal sinus rhythm with rate 73 bpm, no significant ST or T-wave changes  Other past medical history she has seen Precious Reel for her rheumatoid arthritis, on Enbrel and methotrexate. She reports symptoms are stable  Echocardiogram 12/23/2012 showed normal ejection fraction with no wall motion abnormality, normal right ventricular size and function, moderately elevated right ventricular systolic pressures.  Cardiac catheterization showed 100% proximally occluded RCA, critical ostial LAD disease, severe mid LAD disease, severe mid left circumflex disease, ejection fraction estimated at 50-55% with mild hypokinesis of the inferior wall.  PMH:   has a past medical history of Leukopenia (2012); Cholelithiasis; GERD (gastroesophageal reflux disease); Hypertension; Hyperlipidemia; Esophageal stricture; Chronic diastolic CHF (congestive heart failure) ([place]); CAD (coronary artery disease); Hypokalemia; Herniated disc; Pneumonia (2013; 08/2012); Exertional shortness of breath; Type I diabetes mellitus (  [place]); History of pancytopenia; H/O hiatal hernia; Rheumatoid arthritis(714.0); NSTEMI (non-ST elevated myocardial infarction) ([place])  (08/2012); Collagen vascular disease ([place]); and Ovarian cancer ([place]).  PSH:    Past Surgical History  Procedure Laterality Date  . Esophagogastroduodenoscopy  2012    Dr. Minna Merritts  . Tubal ligation  1970  . Cataract extraction w/ intraocular lens implant Right 2010  . Cardiac catheterization  10/18/2012    "first one was today" (10/18/2012)  . Esophageal dilation      "3 or 4 times" (10/19/2011)  . Coronary artery bypass graft N/A 10/19/2012    Procedure: CORONARY ARTERY BYPASS GRAFTING (CABG);  Surgeon: Melrose Nakayama, MD;  Location: [place];  Service: Open Heart Surgery;  Laterality: N/A;  . Peripheral vascular catheterization N/A 03/02/2015    Procedure: Porta Cath Insertion;  Surgeon: Algernon Huxley, MD;  Location: [place] [facility] LAB;  Service: Cardiovascular;  Laterality: N/A;  . Abdominal hysterectomy  06/15/2015    Dx L/S, EXLAP TAH BSO omentectomy RSRx colostomy diaphragm resection stripping  . Cholecystectomy  06/15/2015    combined case with ovarian cancer debulking  . Colon surgery    . Ostomy      Current Outpatient Prescriptions  Medication Sig Dispense Refill  . Calcium-Vitamin D 600-200 MG-UNIT tablet Take 2 tablets by mouth daily.     Marland Kitchen enoxaparin (LOVENOX) 60 MG/0.6ML injection Inject 0.6 mLs (60 mg total) into the skin every 12 (twelve) hours. 60 Syringe 2  . folic acid (FOLVITE) 1 MG tablet TAKE 1 TABLET BY MOUTH ONCE A DAY 90 tablet 3  . furosemide (LASIX) 80 MG tablet Take 80 mg by mouth daily.     . Insulin Human (INSULIN PUMP) SOLN Pt uses Humalog insulin.    Marland Kitchen insulin lispro (HUMALOG) 100 UNIT/ML injection Inject 0.06 mLs (6 Units total) into the skin daily. 30 mL 3  . lidocaine-prilocaine (EMLA) cream Apply 1 application topically as needed (prior to accessing port).    . losartan (COZAAR) 100 MG tablet TAKE 1 TABLET BY MOUTH ONCE A DAY 90 tablet 1  . metoprolol tartrate (LOPRESSOR) 25 MG tablet TAKE 1 TABLET BY MOUTH TWICE A DAY AS NEEDED 180 tablet 3  .  ondansetron (ZOFRAN) 8 MG tablet Take 1 tablet (8 mg total) by mouth every 8 (eight) hours as needed for nausea or vomiting. 20 tablet 1  . oxyCODONE (OXY IR/ROXICODONE) 5 MG immediate release tablet Take 5 mg by mouth every 6 (six) hours as needed. Reported on 09/03/2015    . POTASSIUM PO Take by mouth daily.    . predniSONE (DELTASONE) 5 MG tablet Take 5 mg by mouth as needed. Reported on 09/17/2015    . ranitidine (ZANTAC) 150 MG tablet Take 1 tablet (150 mg total) by mouth 2 (two) times daily. 180 tablet 2   No current facility-administered medications for this visit.   Facility-Administered Medications Ordered in Other Visits  Medication Dose Route Frequency Provider Last Rate Last Dose  . sodium chloride 0.9 % injection 10 mL  10 mL Intracatheter PRN Lequita Asal, MD      . sodium chloride 0.9 % injection 10 mL  10 mL Intravenous PRN Lequita Asal, MD   10 mL at 04/13/15 [id number]     Allergies:   Codeine   Social History:  The patient  reports that she quit smoking about 21 years ago. Her smoking use included Cigarettes. She has a 30 pack-year smoking history. She has never used smokeless  tobacco. She reports that she does not drink alcohol or use illicit drugs.   Family History:   family history includes Arthritis in her father and mother; Bone cancer in her sister; Diabetes in her father and mother.    Review of Systems: Review of Systems  Respiratory: Negative.   Cardiovascular: Negative.   Gastrointestinal: Negative.   Musculoskeletal: Negative.   Neurological: Positive for weakness.  Psychiatric/Behavioral: Negative.   All other systems reviewed and are negative.    PHYSICAL EXAM: VS:  BP 112/50 mmHg  Pulse 73  Ht 5\' 3"  (1.6 m)  Wt 126 lb 8 oz (57.38 kg)  BMI 22.41 kg/m2 , BMI Body mass index is 22.41 kg/(m^2). GEN: Well nourished, well developed, in no acute distress, thin HEENT: normal Neck: no JVD, carotid bruits, or masses Cardiac: RRR; no murmurs, rubs,  or gallops,no edema  Respiratory:  clear to auscultation bilaterally, normal work of breathing GI: soft, nontender, nondistended, + BS MS: no deformity or atrophy Skin: warm and dry, no rash Neuro:  Strength and sensation are intact Psych: euthymic mood, full affect    Recent Labs: 07/01/2015: B Natriuretic Peptide 237.0* 09/17/2015: ALT 13*; BUN 16; Creatinine, Ser 0.63; Hemoglobin 10.6*; Magnesium 1.8; Platelets 282; Potassium 3.9; Sodium 134*; TSH 10.710*    Lipid Panel Lab Results  Component Value Date   CHOL 184 11/18/2014   HDL 71.80 11/18/2014   LDLCALC 99 11/18/2014   TRIG 64.0 11/18/2014      Wt Readings from Last 3 Encounters:  10/23/15 126 lb 8 oz (57.38 kg)  09/17/15 127 lb 12.1 oz (57.95 kg)  09/03/15 134 lb 7.7 oz (61 kg)       ASSESSMENT AND PLAN:  Coronary artery disease involving coronary bypass graft of native heart without angina pectoris - Plan: EKG 12-Lead Currently with no symptoms of angina. No further workup at this time. Continue current medication regimen.  Essential hypertension - Plan: EKG 12-Lead Blood pressure low after recent weight loss, recommended she stop her amlodipine  Hyperlipidemia No recent lipid panel available, order has been placed for repeat lipid panel at her convenience Currently not on her statin This was discontinued somewhere along the way in the past several months   diabetes mellitus with other circulatory complication (HCC) Dramatic weight loss, no recent hemoglobin A1c  Recurrent pleural effusion on right Recommended she stay on Lasix 80 mg daily given small residual pleural effusion  Ovarian cancer, unspecified laterality ([place]) Details as above, scheduled for 2 more rounds of chemotherapy once she gets stronger Notes indicating metastases  Disposition:   F/U  6 months   Orders Placed This Encounter  Procedures  . EKG [facility] records reviewed at length above  Total encounter time more than 25  minutes  Greater than 50% was spent in counseling and coordination of care with the patient   Signed, Esmond Plants, M.D., Ph.D. 10/23/2015  [place], [place]

## 2015-10-23 NOTE — Patient Instructions (Addendum)
Medication Instructions:   Please stop amlodipine  Stay on lasix 80 for now    Follow-Up: It was a pleasure seeing you in the office today. Please call us if you have new issues that need to be addressed before your next appt.  [phone]  Your physician wants you to follow-up in: 6 months.  You will receive a reminder letter in the mail two months in advance. If you don't receive a letter, please call our office to schedule the follow-up appointment.  If you need a refill on your cardiac medications before your next appointment, please call your pharmacy.

## 2015-11-02 ENCOUNTER — Other Ambulatory Visit: Payer: Self-pay | Admitting: Hematology and Oncology

## 2015-11-02 ENCOUNTER — Other Ambulatory Visit: Payer: Self-pay

## 2015-11-02 DIAGNOSIS — C569 Malignant neoplasm of unspecified ovary: Secondary | ICD-10-CM

## 2015-11-06 ENCOUNTER — Inpatient Hospital Stay: Payer: Medicare Other | Attending: Hematology and Oncology | Admitting: Hematology and Oncology

## 2015-11-06 ENCOUNTER — Encounter: Payer: Self-pay | Admitting: Hematology and Oncology

## 2015-11-06 ENCOUNTER — Inpatient Hospital Stay: Payer: Medicare Other

## 2015-11-06 VITALS — BP 110/63 | HR 84 | Temp 98.0°F | Ht 63.0 in | Wt 126.4 lb

## 2015-11-06 DIAGNOSIS — I82C11 Acute embolism and thrombosis of right internal jugular vein: Secondary | ICD-10-CM

## 2015-11-06 DIAGNOSIS — C569 Malignant neoplasm of unspecified ovary: Secondary | ICD-10-CM | POA: Diagnosis not present

## 2015-11-06 DIAGNOSIS — G629 Polyneuropathy, unspecified: Secondary | ICD-10-CM | POA: Insufficient documentation

## 2015-11-06 DIAGNOSIS — R634 Abnormal weight loss: Secondary | ICD-10-CM | POA: Insufficient documentation

## 2015-11-06 DIAGNOSIS — K219 Gastro-esophageal reflux disease without esophagitis: Secondary | ICD-10-CM | POA: Insufficient documentation

## 2015-11-06 DIAGNOSIS — I998 Other disorder of circulatory system: Secondary | ICD-10-CM | POA: Diagnosis not present

## 2015-11-06 DIAGNOSIS — K750 Abscess of liver: Secondary | ICD-10-CM | POA: Diagnosis not present

## 2015-11-06 DIAGNOSIS — C786 Secondary malignant neoplasm of retroperitoneum and peritoneum: Secondary | ICD-10-CM

## 2015-11-06 DIAGNOSIS — D72819 Decreased white blood cell count, unspecified: Secondary | ICD-10-CM

## 2015-11-06 DIAGNOSIS — R188 Other ascites: Secondary | ICD-10-CM

## 2015-11-06 DIAGNOSIS — R0602 Shortness of breath: Secondary | ICD-10-CM | POA: Insufficient documentation

## 2015-11-06 DIAGNOSIS — J90 Pleural effusion, not elsewhere classified: Secondary | ICD-10-CM | POA: Diagnosis not present

## 2015-11-06 DIAGNOSIS — Z90722 Acquired absence of ovaries, bilateral: Secondary | ICD-10-CM | POA: Insufficient documentation

## 2015-11-06 DIAGNOSIS — Z794 Long term (current) use of insulin: Secondary | ICD-10-CM | POA: Diagnosis not present

## 2015-11-06 DIAGNOSIS — I10 Essential (primary) hypertension: Secondary | ICD-10-CM | POA: Insufficient documentation

## 2015-11-06 DIAGNOSIS — I2510 Atherosclerotic heart disease of native coronary artery without angina pectoris: Secondary | ICD-10-CM

## 2015-11-06 DIAGNOSIS — M069 Rheumatoid arthritis, unspecified: Secondary | ICD-10-CM | POA: Diagnosis not present

## 2015-11-06 DIAGNOSIS — I252 Old myocardial infarction: Secondary | ICD-10-CM | POA: Insufficient documentation

## 2015-11-06 DIAGNOSIS — E108 Type 1 diabetes mellitus with unspecified complications: Secondary | ICD-10-CM | POA: Diagnosis not present

## 2015-11-06 DIAGNOSIS — Z79899 Other long term (current) drug therapy: Secondary | ICD-10-CM | POA: Insufficient documentation

## 2015-11-06 DIAGNOSIS — I5032 Chronic diastolic (congestive) heart failure: Secondary | ICD-10-CM | POA: Diagnosis not present

## 2015-11-06 DIAGNOSIS — E876 Hypokalemia: Secondary | ICD-10-CM | POA: Diagnosis not present

## 2015-11-06 DIAGNOSIS — K449 Diaphragmatic hernia without obstruction or gangrene: Secondary | ICD-10-CM | POA: Insufficient documentation

## 2015-11-06 DIAGNOSIS — Z86718 Personal history of other venous thrombosis and embolism: Secondary | ICD-10-CM | POA: Insufficient documentation

## 2015-11-06 DIAGNOSIS — Z90710 Acquired absence of both cervix and uterus: Secondary | ICD-10-CM | POA: Insufficient documentation

## 2015-11-06 DIAGNOSIS — Z9641 Presence of insulin pump (external) (internal): Secondary | ICD-10-CM | POA: Insufficient documentation

## 2015-11-06 DIAGNOSIS — C801 Malignant (primary) neoplasm, unspecified: Secondary | ICD-10-CM

## 2015-11-06 DIAGNOSIS — F17210 Nicotine dependence, cigarettes, uncomplicated: Secondary | ICD-10-CM

## 2015-11-06 DIAGNOSIS — E785 Hyperlipidemia, unspecified: Secondary | ICD-10-CM | POA: Diagnosis not present

## 2015-11-06 DIAGNOSIS — K8020 Calculus of gallbladder without cholecystitis without obstruction: Secondary | ICD-10-CM | POA: Diagnosis not present

## 2015-11-06 DIAGNOSIS — Z8701 Personal history of pneumonia (recurrent): Secondary | ICD-10-CM | POA: Insufficient documentation

## 2015-11-06 LAB — COMPREHENSIVE METABOLIC PANEL
ALT: 124 U/L — ABNORMAL HIGH (ref 14–54)
AST: 29 U/L (ref 15–41)
Albumin: 3.4 g/dL — ABNORMAL LOW (ref 3.5–5.0)
Alkaline Phosphatase: 348 U/L — ABNORMAL HIGH (ref 38–126)
Anion gap: 8 (ref 5–15)
BUN: 21 mg/dL — ABNORMAL HIGH (ref 6–20)
CO2: 25 mmol/L (ref 22–32)
Calcium: 9.2 mg/dL (ref 8.9–10.3)
Chloride: 97 mmol/L — ABNORMAL LOW (ref 101–111)
Creatinine, Ser: 0.48 mg/dL (ref 0.44–1.00)
GFR calc Af Amer: >60 mL/min (ref >60)
GFR calc non Af Amer: >60 mL/min (ref >60)
Glucose, Bld: 188 mg/dL — ABNORMAL HIGH (ref 65–99)
Potassium: 4.9 mmol/L (ref 3.5–5.1)
Sodium: 130 mmol/L — ABNORMAL LOW (ref 135–145)
Total Bilirubin: 0.3 mg/dL (ref 0.3–1.2)
Total Protein: 7.5 g/dL (ref 6.5–8.1)

## 2015-11-06 LAB — T4, FREE: Free T4: 1.17 ng/dL — ABNORMAL HIGH (ref 0.61–1.12)

## 2015-11-06 LAB — TSH: TSH: 6.13 u[IU]/mL — ABNORMAL HIGH (ref 0.350–4.500)

## 2015-11-06 LAB — MAGNESIUM: Magnesium: 1.9 mg/dL (ref 1.7–2.4)

## 2015-11-06 LAB — CBC WITH DIFFERENTIAL/PLATELET
Basophils Absolute: 0 K/uL (ref 0–0.1)
Basophils Relative: 1 %
Eosinophils Absolute: 0 K/uL (ref 0–0.7)
Eosinophils Relative: 1 %
HCT: 33.9 % — ABNORMAL LOW (ref 35.0–47.0)
Hemoglobin: 11.7 g/dL — ABNORMAL LOW (ref 12.0–16.0)
Lymphocytes Relative: 29 %
Lymphs Abs: 0.6 K/uL — ABNORMAL LOW (ref 1.0–3.6)
MCH: 29 pg (ref 26.0–34.0)
MCHC: 34.5 g/dL (ref 32.0–36.0)
MCV: 84.1 fL (ref 80.0–100.0)
Monocytes Absolute: 0.5 K/uL (ref 0.2–0.9)
Monocytes Relative: 22 %
Neutro Abs: 1 K/uL — ABNORMAL LOW (ref 1.4–6.5)
Neutrophils Relative %: 47 %
Platelets: 160 K/uL (ref 150–440)
RBC: 4.03 MIL/uL (ref 3.80–5.20)
RDW: 14.6 % — ABNORMAL HIGH (ref 11.5–14.5)
WBC: 2.1 K/uL — ABNORMAL LOW (ref 3.6–11.0)

## 2015-11-06 LAB — RETICULOCYTES
RBC.: 4.03 MIL/uL (ref 3.80–5.20)
Retic Count, Absolute: 80.6 K/uL (ref 19.0–183.0)
Retic Ct Pct: 2 % (ref 0.4–3.1)

## 2015-11-06 LAB — FOLATE: Folate: 49 ng/mL (ref >5.9)

## 2015-11-06 NOTE — Progress Notes (Signed)
[place] [facility] day:  11/06/2015   Chief Complaint: [name] is a 70 y.o. female with bilateral ovarian masses with associated peritoneal metastatic disease, right sided empyema, liver abscess, and right IJ thrombus who is seen for reassessment.  HPI:  The patient was last seen in the medical oncology clinic on 09/17/2015.  At that time, she had lost 20 pounds since 07/28/2015 despite eating well.  She was neutropenic ([place] 800).  Chest, abdomen, and pelvic CT scan on 09/11/2015 revealed a residual versus recurrent fluid collection posterior to the right hepatic lobe (2.6 x 1.1 x 4.0 cm).  She was admitted to to [facility].  She completed 4 weeks of ertapenem.  She was transitioned to 4 weeks of Flagyl an Levaquin which she completed.  Chest, abdomen, and pelvic CT scan on 10/13/2015 revealed resolution of the empyema.    She was seen by Dr. Theora Gianotti on 10/27/2015.  At that time, her neuropathy was still severe.  Her husband was doing her ostomy changes due to her neuropathy.  It was difficult for her to use a fork.  She remained on Lovenox.  She was felt ready to reinitiate chemotherapy.  Symptomatically, she is eating well.  Weight is down 1 pounds since last visit.  Her husband feels that she has recently gained weight.  Her right heel has healed.  The left heel still has a small area.  She notes no feeling in her fingertips.  Her hands feel "like sandpaper".  Her feet are cold and numb.   Past Medical History  Diagnosis Date  . Leukopenia 2012    s/p bone marrow biopsy, Dr. Ma Hillock  . Cholelithiasis   . GERD (gastroesophageal reflux disease)   . Hypertension   . Hyperlipidemia   . Esophageal stricture   . Chronic diastolic CHF (congestive heart failure) ([place])   . CAD (coronary artery disease)     a. 09/2012 Cath: LM nl, LAD 95p, 34m LCX 968mOM2 50, RCA 100.  . Marland Kitchenypokalemia   . Herniated disc   . Pneumonia 2013; 08/2012    "one lung; double"  (10/18/2012)  . Exertional shortness of breath   . Type I diabetes mellitus ([place]    "dx'd in 1957" (10/18/2012)  . History of pancytopenia   . H/O hiatal hernia   . Rheumatoid arthritis(714.0)   . NSTEMI (non-ST elevated myocardial infarction) ([place]2014    "mild" (10/18/2012)  . Collagen vascular disease ([place]  . [facility]    Past Surgical History  Procedure Laterality Date  . Esophagogastroduodenoscopy  2012    Dr. IfMinna Merritts. Tubal ligation  1970  . Cataract extraction w/ intraocular lens implant Right 2010  . Cardiac catheterization  10/18/2012    "first one was today" (10/18/2012)  . Esophageal dilation      "3 or 4 times" (10/19/2011)  . Coronary artery bypass graft N/A 10/19/2012    Procedure: CORONARY ARTERY BYPASS GRAFTING (CABG);  Surgeon: StMelrose NakayamaMD;  [place] [place] Service: Open Heart Surgery;  Laterality: N/A;  . Peripheral vascular catheterization N/A 03/02/2015    Procedure: Porta Cath Insertion;  Surgeon: JaAlgernon HuxleyMD;  [place];  Service: Cardiovascular;  Laterality: N/A;  . Abdominal hysterectomy  06/15/2015    Dx L/S, EXLAP TAH BSO omentectomy RSRx colostomy diaphragm resection stripping  . Cholecystectomy  06/15/2015    combined case with ovarian cancer debulking  .  Colon surgery    . Ostomy      Family History  Problem Relation Age of Onset  . Diabetes Mother   . Arthritis Mother   . Diabetes Father   . Arthritis Father   . Bone cancer Sister     Social History:  reports that she quit smoking about 21 years ago. Her smoking use included Cigarettes. She has a 30 pack-year smoking history. She has never used smokeless tobacco. She reports that she does not drink alcohol or use illicit drugs.  The patient is accompanied by her husband today.  Allergies:  Allergies  Allergen Reactions  . Codeine Nausea And Vomiting    Current Medications: Current Outpatient Prescriptions  Medication Sig Dispense  Refill  . Calcium-Vitamin D 600-200 MG-UNIT tablet Take 2 tablets by mouth daily.     Marland Kitchen enoxaparin (LOVENOX) 60 MG/0.6ML injection Inject 0.6 mLs (60 mg total) into the skin every 12 (twelve) hours. 60 Syringe 2  . folic acid (FOLVITE) 1 MG tablet TAKE 1 TABLET BY MOUTH ONCE A DAY 90 tablet 3  . furosemide (LASIX) 80 MG tablet Take 80 mg by mouth daily.     . Insulin Human (INSULIN PUMP) SOLN Pt uses Humalog insulin.    Marland Kitchen insulin lispro (HUMALOG) 100 UNIT/ML injection Inject 0.06 mLs (6 Units total) into the skin daily. 30 mL 3  . lidocaine-prilocaine (EMLA) cream Apply 1 application topically as needed (prior to accessing port).    . metoprolol tartrate (LOPRESSOR) 25 MG tablet TAKE 1 TABLET BY MOUTH TWICE A DAY AS NEEDED 180 tablet 3  . ondansetron (ZOFRAN) 8 MG tablet Take 1 tablet (8 mg total) by mouth every 8 (eight) hours as needed for nausea or vomiting. 20 tablet 1  . oxyCODONE (OXY IR/ROXICODONE) 5 MG immediate release tablet Take 5 mg by mouth every 6 (six) hours as needed. Reported on 09/03/2015    . POTASSIUM PO Take by mouth daily.    . predniSONE (DELTASONE) 5 MG tablet Take 5 mg by mouth as needed. Reported on 09/17/2015    . ranitidine (ZANTAC) 150 MG tablet Take 1 tablet (150 mg total) by mouth 2 (two) times daily. 180 tablet 2  . losartan (COZAAR) 100 MG tablet TAKE 1 TABLET BY MOUTH ONCE A DAY 90 tablet 1   No current facility-administered medications for this visit.   Facility-Administered Medications Ordered in Other Visits  Medication Dose Route Frequency Provider Last Rate Last Dose  . sodium chloride 0.9 % injection 10 mL  10 mL Intracatheter PRN Lequita Asal, MD      . sodium chloride 0.9 % injection 10 mL  10 mL Intravenous PRN Lequita Asal, MD   10 mL at 04/13/15 1017    Review of Systems:  GENERAL:  Fatigue.  No fever, chills or sweats.  Weight down 21 pounds since 07/28/2015.  Ambulating with a cane. PERFORMANCE STATUS (ECOG):  2 HEENT:  No visual  changes, sore throat, mouth sores or tenderness. Lungs:  No shortness of breath or cough.  No hemoptysis. Cardiac:  No chest pain, palpitations, orthopnea, or PND. GI:  Appetite good.  Eating well.  No constipation, diarrhea, melena or hematochezia. GU:  No urgency, frequency, dysuria, or hematuria. Musculoskeletal:  Severe rheumatoid arthritis.  Osteoporosis.  No back pain. No muscle tenderness. Extremities:  No pain or swelling. Skin:  No rashes or skin changes. Neuro:  Neuropathy (see HPI).  No headache, numbness or weakness, balance or coordination issues. Endocrine:  Diabetes.  Insulin pump.  No thyroid issues, hot flashes or night sweats. Psych:  No mood changes, depression or anxiety. Pain: No pain. Review of systems:  All other systems reviewed and found to be negative.  Physical Exam: Blood pressure 110/63, pulse 84, temperature 98 F (36.7 C), temperature source Tympanic, height 5' 3"  (1.6 m), weight 126 lb 6.9 oz (57.35 kg). GENERAL:  Thin woman sitting comfortably inthe exam room in no acute distress.   MENTAL STATUS:  Alert and oriented to person, place and time. HEAD:  Short gray hair.  Normocephalic, atraumatic, face symmetric, no Cushingoid features. EYES:  Gold rimmed glasses.  Blue eyes.  No conjunctivitis or scleral icterus.  ENT: Oropharynx clear without lesion. Tongue normal. Mucous membranes moist.  RESPIRATORY: Clear to auscultation without rales, wheezes or rhonchi. CARDIOVASCULAR: Regular rate and rhythm without murmur, rub or gallop. ABDOMEN: Soft, non-tender with active bowel sounds and no appreciable hepatosplenomegaly. No palpable nodularity or masses. SKIN: No rashes. EXTREMITIES:  No edema, skin discoloration or tenderness. No palpable cords. LYMPH NODES: No palpable cervical, supraclavicular, axillary or inguinal adenopathy  NEUROLOGICAL: Appropriate. PSYCH: Appropriate.  No visits with results within 3 Day(s) from this visit. Latest known  visit with results is:  Orders Only on 09/18/2015  Component Date Value Ref Range Status  . Free T4 09/17/2015 0.99  0.61 - 1.12 ng/dL Final    Assessment:  [name] is a 70 y.o. female with clinical stage IIIC (T3cN1Mx) ovarian cancer presenting with abdominal discomfort and bloating.  Omental biopsy on 02/23/2015 revealed metastatic high grade serous carcinoma, consistent with gynecologic origin.  CA125 was 707 on 02/17/2015.  Abdomen and pelvic CT scan on 02/13/2015 revealed bilateral mass-like adnexal regions (right adnexal mass 5.6 x 5.0 cm and the left adnexa mass 10.3 x 6.0 x 9.9 cm).  There was a large amount of soft tissue throughout the peritoneal cavity involving the omentum and other peritoneal surfaces.  There was a small volume ascites. There was a peripheral 3.3 x 1.9 cm low-attenuation lesion overlying the right lobe of the liver , likely representing a serosal implant. There was 1.4 x 2.2 cm ill-defined peripheral lesion within the inferior aspect of segment 6 adjacent to the ampulla in the duodenum.   She has severe rheumatoid arthritis.  Methotrexate and Enbrel are on hold.  She has a normocytic anemia.  Work-up on 02/17/2015 revealed a normal ferritin, B12, folate, TSH.  She denies any melena or hematochezia.  She has diabetes and is on an insulin pump.  She is status post cycle #4 neoadjuvant carboplatin and Taxol (03/05/2015 - 05/22/2015).  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  She had loose stools on oral magnesium.  She was initially on Neurontin then switched Lyrica with cycle #3.  Cycle #4 was notable for neutropenia (ANC 300) requiring GCSF x 3 days.    CA125 was 802.9 on 03/30/2015, 567.9 on 04/13/2015, 168.8 on 05/15/2015, 85.2 on 07/17/2015, 68.6 on 07/28/2015, 34.5 on 09/17/2015, and 20.7 on 11/06/2015.  Abdominal and pelvic CT scan on 04/28/2015 revealed decreasing bilateral ovarian masses.   The left adnexal mass measured 4.0 x 6.1 cm (previously  5.0 x 8.5 cm). The right ovary measured 3.8 x 4.9 cm (previously 4.7 x 5.6 cm).  There was improved peritoneal carcinomatosis.  There was a small amount of ascites.  The hepatic dome lesion was stable. The previously seen right hepatic lobe lesion was not well-visualized.  There was a small left pleural  effusion and trace right pleural effusion.  The nodular lesion at the ampulla of Vater, extending into the duodenum was stable.  There was no biliary ductal dilatation.  She underwent exploratory laparotomy, lysis of adhesions, total abdominal hysterectomy with bilateral salpingo-oophorectomy, infracolic omentectomy, optimal tumor debulking(< 1 cm), recto-sigmoid resection with creation of end colostomy, cholecystectomy, mobilization of splenic flexure and liver with diaphragmatic stripping on 06/15/2015. The right diaphragm was cleared of tumor. During dissection, the diaphragm was entered and closed with sutures.   She has had a recurrent right sided pleural effusion.  She underwent thoracentesis of 650 cc on post-operative day 3.  She was admitted to [facility] on 07/01/2015 and 07/07/2015 for recurrent shortness of breath.  She has undergone 2 additional thoracentesis (1.1 L on 07/02/2015 and 850 cc on 07/08/2015).  Cytology was negative x 2.  Bilateral lower extremity duplex on 07/03/2015 was negative.  Echo on 07/08/2015 revealed en EF of 55-60%.    She has anemia likely due to chronic disease. She received 1 unit PRBCs during her recent admission to [facility]. She denies any melena or hematochezia. Diet is modest, but improving.  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 She is on Lovenox 60 mg twice a day.   She was admitted to [facility] from 07/29/2015 - 08/10/2015 with a right-sided empyema and liver abscess. She underwent CT-guided placement of a liver  abscess drain on 07/30/2015. Liver abscess culture grew out group B strep and Enterobacter which was sensitive to Zosyn. She was transitioned to ertapenem Colbert Ewing) prior to discharge.  She was readmitted to [facility] on 09/17/2015.  Chest, abdomen, and pelvic CT scan on 09/01/2015 revealed continued decrease in perihepatic fluid collection contiguous with the right pleural space with percutaneous drain. Drain was removed on 09/01/2015.  Chest, abdomen, and pelvic CT scan on 09/11/2015 revealed a residual versus recurrent fluid collection posterior to the right hepatic lobe (2.6 x 1.1 x 4.0 cm).  Chest, abdomen, and pelvic CT scan on 10/13/2015 revealed resolution of the empyema.    Symptomatically, she feels "ready to go".  She has lost 21 pounds since 07/28/2015.  She has a grade III neuropathy.  She has leukopenia.  Plan: 1.  Labs today:  CBC with diff, CMP, Mg, CA125, MMA, folate, retic, TSH, free T4. 2.  Discuss last imaging.  Discuss chemotherapy (carboplatin and gemcitabine) secondary to neuropathy.  Discuss low counts.  Discuss evaluation and conisderation of bone marrow if counts do not recover.  3.  Preauth carboplatin and gemcitabine (day 1 and 8). 4.  RTC for MD assessment, labs (CBC with diff, CMP, Mg, CA125), and day 1 of cycle #1 carboplatin and gemcitabine   Lequita Asal, MD  11/06/2015, 3:25 PM

## 2015-11-06 NOTE — Progress Notes (Signed)
Patient here for follow up. No changes since last visit.   

## 2015-11-07 LAB — CA 125: CA 125: 20.7 U/mL (ref 0.0–38.1)

## 2015-11-09 ENCOUNTER — Inpatient Hospital Stay: Payer: Medicare Other

## 2015-11-09 ENCOUNTER — Other Ambulatory Visit: Payer: Self-pay | Admitting: Hematology and Oncology

## 2015-11-09 ENCOUNTER — Encounter: Payer: Self-pay | Admitting: Hematology and Oncology

## 2015-11-09 ENCOUNTER — Inpatient Hospital Stay (HOSPITAL_BASED_OUTPATIENT_CLINIC_OR_DEPARTMENT_OTHER): Payer: Medicare Other | Admitting: Hematology and Oncology

## 2015-11-09 VITALS — BP 115/66 | HR 78 | Temp 97.3°F | Resp 18 | Wt 126.5 lb

## 2015-11-09 DIAGNOSIS — R0602 Shortness of breath: Secondary | ICD-10-CM

## 2015-11-09 DIAGNOSIS — Z79899 Other long term (current) drug therapy: Secondary | ICD-10-CM

## 2015-11-09 DIAGNOSIS — J90 Pleural effusion, not elsewhere classified: Secondary | ICD-10-CM

## 2015-11-09 DIAGNOSIS — K750 Abscess of liver: Secondary | ICD-10-CM | POA: Diagnosis not present

## 2015-11-09 DIAGNOSIS — D72819 Decreased white blood cell count, unspecified: Secondary | ICD-10-CM | POA: Diagnosis not present

## 2015-11-09 DIAGNOSIS — Z8701 Personal history of pneumonia (recurrent): Secondary | ICD-10-CM

## 2015-11-09 DIAGNOSIS — K219 Gastro-esophageal reflux disease without esophagitis: Secondary | ICD-10-CM

## 2015-11-09 DIAGNOSIS — R634 Abnormal weight loss: Secondary | ICD-10-CM

## 2015-11-09 DIAGNOSIS — Z86718 Personal history of other venous thrombosis and embolism: Secondary | ICD-10-CM

## 2015-11-09 DIAGNOSIS — I10 Essential (primary) hypertension: Secondary | ICD-10-CM

## 2015-11-09 DIAGNOSIS — R748 Abnormal levels of other serum enzymes: Secondary | ICD-10-CM

## 2015-11-09 DIAGNOSIS — Z90710 Acquired absence of both cervix and uterus: Secondary | ICD-10-CM

## 2015-11-09 DIAGNOSIS — K449 Diaphragmatic hernia without obstruction or gangrene: Secondary | ICD-10-CM

## 2015-11-09 DIAGNOSIS — I252 Old myocardial infarction: Secondary | ICD-10-CM

## 2015-11-09 DIAGNOSIS — C801 Malignant (primary) neoplasm, unspecified: Principal | ICD-10-CM

## 2015-11-09 DIAGNOSIS — C569 Malignant neoplasm of unspecified ovary: Secondary | ICD-10-CM | POA: Diagnosis not present

## 2015-11-09 DIAGNOSIS — I2510 Atherosclerotic heart disease of native coronary artery without angina pectoris: Secondary | ICD-10-CM

## 2015-11-09 DIAGNOSIS — M069 Rheumatoid arthritis, unspecified: Secondary | ICD-10-CM

## 2015-11-09 DIAGNOSIS — F17210 Nicotine dependence, cigarettes, uncomplicated: Secondary | ICD-10-CM

## 2015-11-09 DIAGNOSIS — G629 Polyneuropathy, unspecified: Secondary | ICD-10-CM

## 2015-11-09 DIAGNOSIS — I998 Other disorder of circulatory system: Secondary | ICD-10-CM

## 2015-11-09 DIAGNOSIS — Z794 Long term (current) use of insulin: Secondary | ICD-10-CM

## 2015-11-09 DIAGNOSIS — I5032 Chronic diastolic (congestive) heart failure: Secondary | ICD-10-CM

## 2015-11-09 DIAGNOSIS — E786 Lipoprotein deficiency: Secondary | ICD-10-CM

## 2015-11-09 DIAGNOSIS — C786 Secondary malignant neoplasm of retroperitoneum and peritoneum: Secondary | ICD-10-CM | POA: Diagnosis not present

## 2015-11-09 DIAGNOSIS — Z90722 Acquired absence of ovaries, bilateral: Secondary | ICD-10-CM

## 2015-11-09 DIAGNOSIS — E785 Hyperlipidemia, unspecified: Secondary | ICD-10-CM

## 2015-11-09 DIAGNOSIS — E108 Type 1 diabetes mellitus with unspecified complications: Secondary | ICD-10-CM

## 2015-11-09 DIAGNOSIS — R188 Other ascites: Secondary | ICD-10-CM

## 2015-11-09 DIAGNOSIS — K8020 Calculus of gallbladder without cholecystitis without obstruction: Secondary | ICD-10-CM

## 2015-11-09 DIAGNOSIS — Z9641 Presence of insulin pump (external) (internal): Secondary | ICD-10-CM

## 2015-11-09 LAB — COMPREHENSIVE METABOLIC PANEL
ALT: 47 U/L (ref 14–54)
AST: 18 U/L (ref 15–41)
Albumin: 3.2 g/dL — ABNORMAL LOW (ref 3.5–5.0)
Alkaline Phosphatase: 230 U/L — ABNORMAL HIGH (ref 38–126)
Anion gap: 6 (ref 5–15)
BUN: 17 mg/dL (ref 6–20)
CO2: 24 mmol/L (ref 22–32)
Calcium: 9.1 mg/dL (ref 8.9–10.3)
Chloride: 100 mmol/L — ABNORMAL LOW (ref 101–111)
Creatinine, Ser: 0.6 mg/dL (ref 0.44–1.00)
GFR calc Af Amer: >60 mL/min (ref >60)
GFR calc non Af Amer: >60 mL/min (ref >60)
Glucose, Bld: 272 mg/dL — ABNORMAL HIGH (ref 65–99)
Potassium: 4.5 mmol/L (ref 3.5–5.1)
Sodium: 130 mmol/L — ABNORMAL LOW (ref 135–145)
Total Bilirubin: 0.4 mg/dL (ref 0.3–1.2)
Total Protein: 7.1 g/dL (ref 6.5–8.1)

## 2015-11-09 LAB — CBC WITH DIFFERENTIAL/PLATELET
Basophils Absolute: 0 10*3/uL (ref 0–0.1)
Basophils Relative: 1 %
Eosinophils Absolute: 0 10*3/uL (ref 0–0.7)
Eosinophils Relative: 2 %
HCT: 35.2 % (ref 35.0–47.0)
Hemoglobin: 12.3 g/dL (ref 12.0–16.0)
Lymphocytes Relative: 28 %
Lymphs Abs: 0.6 10*3/uL — ABNORMAL LOW (ref 1.0–3.6)
MCH: 29.4 pg (ref 26.0–34.0)
MCHC: 34.8 g/dL (ref 32.0–36.0)
MCV: 84.5 fL (ref 80.0–100.0)
Monocytes Absolute: 0.4 10*3/uL (ref 0.2–0.9)
Monocytes Relative: 17 %
Neutro Abs: 1.1 10*3/uL — ABNORMAL LOW (ref 1.4–6.5)
Neutrophils Relative %: 52 %
Platelets: 178 10*3/uL (ref 150–440)
RBC: 4.17 MIL/uL (ref 3.80–5.20)
RDW: 14.4 % (ref 11.5–14.5)
WBC: 2.2 10*3/uL — ABNORMAL LOW (ref 3.6–11.0)

## 2015-11-09 LAB — MAGNESIUM: Magnesium: 1.6 mg/dL — ABNORMAL LOW (ref 1.7–2.4)

## 2015-11-09 MED ORDER — SODIUM CHLORIDE 0.9 % IV SOLN
INTRAVENOUS | Status: DC
  Administered 2015-11-09: 11:00:00 via INTRAVENOUS
  Filled 2015-11-09: qty 1000

## 2015-11-09 MED ORDER — HEPARIN SOD (PORK) LOCK FLUSH 100 UNIT/ML IV SOLN
INTRAVENOUS | Status: AC
  Filled 2015-11-09: qty 5

## 2015-11-09 MED ORDER — SODIUM CHLORIDE 0.9 % IV SOLN
1.0000 g | Freq: Once | INTRAVENOUS | Status: AC
  Administered 2015-11-09: 1 g via INTRAVENOUS
  Filled 2015-11-09: qty 2

## 2015-11-09 MED ORDER — HEPARIN SOD (PORK) LOCK FLUSH 100 UNIT/ML IV SOLN
500.0000 [IU] | Freq: Once | INTRAVENOUS | Status: AC
  Administered 2015-11-09: 500 [IU] via INTRAVENOUS

## 2015-11-09 NOTE — Progress Notes (Signed)
[facility] [place] [facility] day:  11/09/2015   Chief Complaint: [name] is a 70 y.o. female with stage IIIC ovarian cancer with a history of right sided empyema and liver abscess who is seen for assessment prior to cycle #1 carboplatin and gemcitabine.  HPI:  The patient was last seen in the medical oncology clinic on 11/06/2015.  At that time, she was seen for reassessment after completion of an extended course of antibiotics for right sided empyema and liver abscess.  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  Discussions were held about carboplatin and gemcitabine.    Labs at last visit included a hematocrit of 33.9, hemoglobin 11.7, MCV 84.1, platelets 160,000, WBC 2100 with an [place] of 1000.  Retic was 2%.  Folate was 49.0.  TSH was 6.13 (high) with a free T4 of 1.17 (0.61-1.12).  CMP revealed a sodium of 130, albumen 3.4, ALT 124 and and alkaline phosphatase of 348.  CA125 was 20.7.  During the interim, she denies any new complaints.   Past Medical History  Diagnosis Date  . Leukopenia 2012    s/p bone marrow biopsy, Dr. Ma Hillock  . Cholelithiasis   . GERD (gastroesophageal reflux disease)   . Hypertension   . Hyperlipidemia   . Esophageal stricture   . Chronic diastolic CHF (congestive heart failure) ([place])   . CAD (coronary artery disease)     a. 09/2012 Cath: LM nl, LAD 95p, 58m LCX 963mOM2 50, RCA 100.  . Marland Kitchenypokalemia   . Herniated disc   . Pneumonia 2013; 08/2012    "one lung; double" (10/18/2012)  . Exertional shortness of breath   . [place]    [place]" (10/18/2012)  . History of pancytopenia   . H/O hiatal hernia   . Rheumatoid arthritis(714.0)   . NSTEMI (non-ST elevated myocardial infarction) ([facility]    "mild" (10/18/2012)  . Collagen vascular disease ([facility]  . [facility]    Past Surgical History  Procedure Laterality Date  . Esophagogastroduodenoscopy  2012    Dr. IfMinna Merritts.  Tubal ligation  1970  . Cataract extraction w/ intraocular lens implant Right 2010  . Cardiac catheterization  10/18/2012    "first one was today" (10/18/2012)  . Esophageal dilation      "3 or 4 times" (10/19/2011)  . Coronary artery bypass graft N/A 10/19/2012    Procedure: CORONARY ARTERY BYPASS GRAFTING (CABG);  Surgeon: StMelrose NakayamaMD;  [place] Service: [place] Surgery;  Laterality: N/A;  . Peripheral vascular catheterization N/A 03/02/2015    Procedure: Porta Cath Insertion;  Surgeon: JaAlgernon HuxleyMD;  [place] [facility];  Service: Cardiovascular;  Laterality: N/A;  . Abdominal hysterectomy  06/15/2015    Dx L/S, EXLAP TAH BSO omentectomy RSRx colostomy diaphragm resection stripping  . Cholecystectomy  06/15/2015    combined case with ovarian cancer debulking  . Colon surgery    . Ostomy      Family History  Problem Relation Age of Onset  . Diabetes Mother   . Arthritis Mother   . Diabetes Father   . Arthritis Father   . Bone cancer Sister     Social History:  reports that she quit smoking about 21 years ago. Her smoking use included Cigarettes. She has a 30 pack-year smoking history. She has never used smokeless tobacco. She reports that she  does not drink alcohol or use illicit drugs.  The patient is accompanied by her husband today.  Allergies:  Allergies  Allergen Reactions  . Codeine Nausea And Vomiting    Current Medications: Current Outpatient Prescriptions  Medication Sig Dispense Refill  . Calcium-Vitamin D 600-200 MG-UNIT tablet Take 2 tablets by mouth daily.     Marland Kitchen enoxaparin (LOVENOX) 60 MG/0.6ML injection Inject 0.6 mLs (60 mg total) into the skin every 12 (twelve) hours. 60 Syringe 2  . folic acid (FOLVITE) 1 MG tablet TAKE 1 TABLET BY MOUTH ONCE A DAY 90 tablet 3  . furosemide (LASIX) 80 MG tablet Take 80 mg by mouth daily.     . Insulin Human (INSULIN PUMP) SOLN Pt uses Humalog insulin.    Marland Kitchen insulin lispro (HUMALOG) 100  UNIT/ML injection Inject 0.06 mLs (6 Units total) into the skin daily. 30 mL 3  . lidocaine-prilocaine (EMLA) cream Apply 1 application topically as needed (prior to accessing port).    . loratadine (CLARITIN) 10 MG tablet Take 1 tablet by mouth daily as needed.    Marland Kitchen losartan (COZAAR) 100 MG tablet TAKE 1 TABLET BY MOUTH ONCE A DAY 90 tablet 1  . metoprolol tartrate (LOPRESSOR) 25 MG tablet TAKE 1 TABLET BY MOUTH TWICE A DAY AS NEEDED 180 tablet 3  . ondansetron (ZOFRAN) 8 MG tablet Take 1 tablet (8 mg total) by mouth every 8 (eight) hours as needed for nausea or vomiting. 20 tablet 1  . oxyCODONE (OXY IR/ROXICODONE) 5 MG immediate release tablet Take 5 mg by mouth every 6 (six) hours as needed. Reported on 09/03/2015    . POTASSIUM PO Take by mouth daily.    . predniSONE (DELTASONE) 5 MG tablet Take 5 mg by mouth as needed. Reported on 09/17/2015    . ranitidine (ZANTAC) 150 MG tablet Take 1 tablet (150 mg total) by mouth 2 (two) times daily. 180 tablet 2   No current facility-administered medications for this visit.   Facility-Administered Medications Ordered in Other Visits  Medication Dose Route Frequency Provider Last Rate Last Dose  . sodium chloride 0.9 % injection 10 mL  10 mL Intracatheter PRN Lequita Asal, MD      . sodium chloride 0.9 % injection 10 mL  10 mL Intravenous PRN Lequita Asal, MD   10 mL at 04/13/15 0932    Review of Systems:  GENERAL:  Fatigue.  No fever, chills or sweats.  Weight down 21 pounds since 07/28/2015.  Weight stable since last visit. PERFORMANCE STATUS (ECOG):  2 HEENT:  No visual changes, sore throat, mouth sores or tenderness. Lungs:  No shortness of breath or cough.  No hemoptysis. Cardiac:  No chest pain, palpitations, orthopnea, or PND. GI:  Appetite good.  Eating well.  No constipation, diarrhea, melena or hematochezia. GU:  No urgency, frequency, dysuria, or hematuria. Musculoskeletal:  Severe rheumatoid arthritis.  Osteoporosis.  No back  pain. No muscle tenderness. Extremities:  No pain or swelling. Skin:  No rashes or skin changes. Neuro:  Neuropathy (stable).  No headache, numbness or weakness, balance or coordination issues. Endocrine:  Diabetes.  Insulin pump.  No thyroid issues, hot flashes or night sweats. Psych:  No mood changes, depression or anxiety. Pain: No pain. Review of systems:  All other systems reviewed and found to be negative.  Physical Exam: Blood pressure 115/66, pulse 78, temperature 97.3 F (36.3 C), temperature source Tympanic, resp. rate 18, weight 126 lb 8.7 oz (57.4 kg). GENERAL:  Thin woman sitting comfortably in the exam room in no acute distress.  Ambulating with a cane. MENTAL STATUS:  Alert and oriented to person, place and time. HEAD:  Short gray hair.  Normocephalic, atraumatic, face symmetric, no Cushingoid features. EYES:  Gold rimmed glasses.  Blue eyes.  No conjunctivitis or scleral icterus.  ENT: Oropharynx clear without lesion. Tongue normal. Mucous membranes moist.  RESPIRATORY: Clear to auscultation without rales, wheezes or rhonchi. CARDIOVASCULAR: Regular rate and rhythm without murmur, rub or gallop. ABDOMEN: Soft, non-tender with active bowel sounds and no appreciable hepatosplenomegaly. No palpable nodularity or masses. SKIN: No rashes. EXTREMITIES:  No edema, skin discoloration or tenderness. No palpable cords. LYMPH NODES: No palpable cervical, supraclavicular, axillary or inguinal adenopathy  NEUROLOGICAL: Appropriate. PSYCH: Appropriate.  Appointment on 11/09/2015  Component Date Value Ref Range Status  . WBC 11/09/2015 2.2* 3.6 - 11.0 K/uL Final  . RBC 11/09/2015 4.17  3.80 - 5.20 MIL/uL Final  . Hemoglobin 11/09/2015 12.3  12.0 - 16.0 g/dL Final  . HCT 11/09/2015 35.2  35.0 - 47.0 % Final  . MCV 11/09/2015 84.5  80.0 - 100.0 fL Final  . MCH 11/09/2015 29.4  26.0 - 34.0 pg Final  . MCHC 11/09/2015 34.8  32.0 - 36.0 g/dL Final  . RDW 11/09/2015 14.4  11.5  - 14.5 % Final  . Platelets 11/09/2015 178  150 - 440 K/uL Final  . Neutrophils Relative % 11/09/2015 52   Final  . Neutro Abs 11/09/2015 1.1* 1.4 - 6.5 K/uL Final  . Lymphocytes Relative 11/09/2015 28   Final  . Lymphs Abs 11/09/2015 0.6* 1.0 - 3.6 K/uL Final  . Monocytes Relative 11/09/2015 17   Final  . Monocytes Absolute 11/09/2015 0.4  0.2 - 0.9 K/uL Final  . Eosinophils Relative 11/09/2015 2   Final  . Eosinophils Absolute 11/09/2015 0.0  0 - 0.7 K/uL Final  . Basophils Relative 11/09/2015 1   Final  . Basophils Absolute 11/09/2015 0.0  0 - 0.1 K/uL Final  . Sodium 11/09/2015 130* 135 - 145 mmol/L Final  . Potassium 11/09/2015 4.5  3.5 - 5.1 mmol/L Final  . Chloride 11/09/2015 100* 101 - 111 mmol/L Final  . CO2 11/09/2015 24  22 - 32 mmol/L Final  . Glucose, Bld 11/09/2015 272* 65 - 99 mg/dL Final  . BUN 11/09/2015 17  6 - 20 mg/dL Final  . Creatinine, Ser 11/09/2015 0.60  0.44 - 1.00 mg/dL Final  . Calcium 11/09/2015 9.1  8.9 - 10.3 mg/dL Final  . Total Protein 11/09/2015 7.1  6.5 - 8.1 g/dL Final  . Albumin 11/09/2015 3.2* 3.5 - 5.0 g/dL Final  . AST 11/09/2015 18  15 - 41 U/L Final  . ALT 11/09/2015 47  14 - 54 U/L Final  . Alkaline Phosphatase 11/09/2015 230* 38 - 126 U/L Final  . Total Bilirubin 11/09/2015 0.4  0.3 - 1.2 mg/dL Final  . GFR calc non Af Amer 11/09/2015 >60  >60 mL/min Final  . GFR calc Af Amer 11/09/2015 >60  >60 mL/min Final   Comment: (NOTE) The eGFR has been calculated using the CKD EPI equation. This calculation has not been validated in all clinical situations. eGFR's persistently <60 mL/min signify possible Chronic Kidney Disease.   . Anion gap 11/09/2015 6  5 - 15 Final  . Magnesium 11/09/2015 1.6* 1.7 - 2.4 mg/dL Final    Assessment:  [name] is a 70 y.o. female with clinical stage IIIC (T3cN1Mx) ovarian cancer presenting  with abdominal discomfort and bloating.  Omental biopsy on 02/23/2015 revealed metastatic high grade serous  carcinoma, consistent with gynecologic origin.  CA125 was 707 on 02/17/2015.  Abdomen and pelvic CT scan on 02/13/2015 revealed bilateral mass-like adnexal regions (right adnexal mass 5.6 x 5.0 cm and the left adnexa mass 10.3 x 6.0 x 9.9 cm).  There was a large amount of soft tissue throughout the peritoneal cavity involving the omentum and other peritoneal surfaces.  There was a small volume ascites. There was a peripheral 3.3 x 1.9 cm low-attenuation lesion overlying the right lobe of the liver , likely representing a serosal implant. There was 1.4 x 2.2 cm ill-defined peripheral lesion within the inferior aspect of segment 6 adjacent to the ampulla in the duodenum.   She has severe rheumatoid arthritis.  Methotrexate and Enbrel are on hold.  She has a normocytic anemia.  Work-up on 02/17/2015 revealed a normal ferritin, B12, folate, TSH.  She denies any melena or hematochezia.  She has diabetes and is on an insulin pump.  She is status post cycle #4 neoadjuvant carboplatin and Taxol (03/05/2015 - 05/22/2015).  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  She had loose stools on oral magnesium.  She was initially on Neurontin then switched Lyrica with cycle #3.  Cycle #4 was notable for neutropenia (ANC 300) requiring GCSF x 3 days.    CA125 was 802.9 on 03/30/2015, 567.9 on 04/13/2015, 168.8 on 05/15/2015, 85.2 on 07/17/2015, 68.6 on 07/28/2015, 34.5 on 09/17/2015, and 20.7 on 11/06/2015.  Abdominal and pelvic CT scan on 04/28/2015 revealed decreasing bilateral ovarian masses.   The left adnexal mass measured 4.0 x 6.1 cm (previously 5.0 x 8.5 cm). The right ovary measured 3.8 x 4.9 cm (previously 4.7 x 5.6 cm).  There was improved peritoneal carcinomatosis.  There was a small amount of ascites.  The hepatic dome lesion was stable. The previously seen right hepatic lobe lesion was not well-visualized.  There was a small left pleural effusion and trace right pleural effusion.  The nodular lesion at  the ampulla of Vater, extending into the duodenum was stable.  There was no biliary ductal dilatation.  She underwent exploratory laparotomy, lysis of adhesions, total abdominal hysterectomy with bilateral salpingo-oophorectomy, infracolic omentectomy, optimal tumor debulking(< 1 cm), recto-sigmoid resection with creation of end colostomy, cholecystectomy, mobilization of splenic flexure and liver with diaphragmatic stripping on 06/15/2015. The right diaphragm was cleared of tumor. During dissection, the diaphragm was entered and closed with sutures.   She has had a recurrent right sided pleural effusion.  She underwent thoracentesis of 650 cc on post-operative day 3.  She was admitted to [facility] on 07/01/2015 and 07/07/2015 for recurrent shortness of breath.  She has undergone 2 additional thoracentesis (1.1 L on 07/02/2015 and 850 cc on 07/08/2015).  Cytology was negative x 2.  Bilateral lower extremity duplex on 07/03/2015 was negative.  Echo on 07/08/2015 revealed en EF of 55-60%.    She has anemia likely due to chronic disease. She received 1 unit PRBCs during her recent admission to [facility]. She denies any melena or hematochezia. Diet is modest, but improving.  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 She is on Lovenox 60 mg twice a day.   She was admitted to [facility] from 07/29/2015 - 08/10/2015 with a right-sided empyema and liver abscess. She underwent CT-guided placement  of a liver abscess drain on 07/30/2015. Liver abscess culture grew out group B strep and Enterobacter which was sensitive to Zosyn. She was transitioned to ertapenem Colbert Ewing) prior to discharge.  She was readmitted to [facility] on 09/17/2015.  Chest, abdomen, and pelvic CT scan on 09/01/2015 revealed continued decrease in perihepatic fluid collection contiguous with the right pleural  space with percutaneous drain. Drain was removed on 09/01/2015.  Chest, abdomen, and pelvic CT scan on 09/11/2015 revealed a residual versus recurrent fluid collection posterior to the right hepatic lobe (2.6 x 1.1 x 4.0 cm).  Chest, abdomen, and pelvic CT scan on 10/13/2015 revealed resolution of the empyema.    Labs on 11/06/2015 revealed a WBC 2100 with an [place] of 1000.  Folate was normal.  MMA is pending.  TSH was 6.13 (high) with a free T4 of 1.17 (0.61-1.12).  Alkaline phosphatase was 348 (38-126).   Symptomatically, she has a grade III neuropathy .  She has lost 21 pounds since 07/28/2015.  She has persistent leukopenia.  She has mild hypomagnesemia.  Plan: 1.  Labs today:  CBC with diff, CMP, fractionated alkaline phosphatase, Mg. 2.  Discuss low counts and concern for likely drop in counts after day 1 chemotherapy precluding treatment on day 8.  Discuss use of Neulasta with every 3 week chemotherapy versus short acting Neupogen with weekly therapy.  Review prior counts (only required GCSF with cycle #4; normal counts this year prior to recent infection).  Patient has not been on therapy for rheumatoid arthritis.  Discuss bone marrow if counts do not recover next week.  3.  No chemotherapy today. 4.  Magnesium sulfate 1 gm IV today. 5.  Discuss thyroid function studies.  FAX to Dr. Gabriel Carina. 6.  RTC in 1 week for MD assessment, labs (CBC with diff, CMP, Mg), and day 1 of cycle #1 carboplatin and gemcitabine   Lequita Asal, MD  11/09/2015, 9:33 AM

## 2015-11-09 NOTE — Progress Notes (Signed)
Offers no complaints. States is feeling well. 

## 2015-11-10 LAB — ALKALINE PHOSPHATASE, ISOENZYMES
Alk Phos Bone Fract: 21 % (ref 14–68)
Alk Phos Liver Fract: 79 % (ref 18–85)
Alk Phos: 254 IU/L — ABNORMAL HIGH (ref 39–117)
Intestinal %: 0 % (ref 0–18)

## 2015-11-10 LAB — METHYLMALONIC ACID, SERUM: Methylmalonic Acid, Quantitative: 132 nmol/L (ref 0–378)

## 2015-11-16 ENCOUNTER — Other Ambulatory Visit: Payer: Self-pay | Admitting: Hematology and Oncology

## 2015-11-17 ENCOUNTER — Inpatient Hospital Stay: Payer: Medicare Other

## 2015-11-17 ENCOUNTER — Encounter: Payer: Self-pay | Admitting: Hematology and Oncology

## 2015-11-17 ENCOUNTER — Other Ambulatory Visit: Payer: Self-pay | Admitting: *Deleted

## 2015-11-17 ENCOUNTER — Inpatient Hospital Stay (HOSPITAL_BASED_OUTPATIENT_CLINIC_OR_DEPARTMENT_OTHER): Payer: Medicare Other | Admitting: Hematology and Oncology

## 2015-11-17 ENCOUNTER — Ambulatory Visit
Admission: RE | Admit: 2015-11-17 | Discharge: 2015-11-17 | Disposition: A | Discharge status: Home / Self Care | Payer: Medicare Other | Source: Ambulatory Visit | Attending: Hematology and Oncology | Admitting: Hematology and Oncology

## 2015-11-17 ENCOUNTER — Other Ambulatory Visit: Payer: Self-pay | Admitting: Hematology and Oncology

## 2015-11-17 ENCOUNTER — Telehealth: Payer: Self-pay | Admitting: *Deleted

## 2015-11-17 VITALS — BP 105/54 | HR 74 | Temp 97.3°F | Resp 18 | Wt 127.9 lb

## 2015-11-17 DIAGNOSIS — Z90710 Acquired absence of both cervix and uterus: Secondary | ICD-10-CM

## 2015-11-17 DIAGNOSIS — R188 Other ascites: Secondary | ICD-10-CM

## 2015-11-17 DIAGNOSIS — J90 Pleural effusion, not elsewhere classified: Secondary | ICD-10-CM

## 2015-11-17 DIAGNOSIS — G629 Polyneuropathy, unspecified: Secondary | ICD-10-CM

## 2015-11-17 DIAGNOSIS — K750 Abscess of liver: Secondary | ICD-10-CM | POA: Diagnosis not present

## 2015-11-17 DIAGNOSIS — E108 Type 1 diabetes mellitus with unspecified complications: Secondary | ICD-10-CM

## 2015-11-17 DIAGNOSIS — R634 Abnormal weight loss: Secondary | ICD-10-CM

## 2015-11-17 DIAGNOSIS — Z9049 Acquired absence of other specified parts of digestive tract: Secondary | ICD-10-CM | POA: Insufficient documentation

## 2015-11-17 DIAGNOSIS — C569 Malignant neoplasm of unspecified ovary: Secondary | ICD-10-CM

## 2015-11-17 DIAGNOSIS — D72819 Decreased white blood cell count, unspecified: Secondary | ICD-10-CM

## 2015-11-17 DIAGNOSIS — D6489 Other specified anemias: Secondary | ICD-10-CM | POA: Diagnosis not present

## 2015-11-17 DIAGNOSIS — I252 Old myocardial infarction: Secondary | ICD-10-CM

## 2015-11-17 DIAGNOSIS — R748 Abnormal levels of other serum enzymes: Secondary | ICD-10-CM | POA: Insufficient documentation

## 2015-11-17 DIAGNOSIS — C786 Secondary malignant neoplasm of retroperitoneum and peritoneum: Secondary | ICD-10-CM | POA: Diagnosis not present

## 2015-11-17 DIAGNOSIS — F17210 Nicotine dependence, cigarettes, uncomplicated: Secondary | ICD-10-CM

## 2015-11-17 DIAGNOSIS — Z794 Long term (current) use of insulin: Secondary | ICD-10-CM

## 2015-11-17 DIAGNOSIS — I5032 Chronic diastolic (congestive) heart failure: Secondary | ICD-10-CM

## 2015-11-17 DIAGNOSIS — I10 Essential (primary) hypertension: Secondary | ICD-10-CM

## 2015-11-17 DIAGNOSIS — Z8701 Personal history of pneumonia (recurrent): Secondary | ICD-10-CM

## 2015-11-17 DIAGNOSIS — Z9641 Presence of insulin pump (external) (internal): Secondary | ICD-10-CM

## 2015-11-17 DIAGNOSIS — I998 Other disorder of circulatory system: Secondary | ICD-10-CM

## 2015-11-17 DIAGNOSIS — Z86718 Personal history of other venous thrombosis and embolism: Secondary | ICD-10-CM

## 2015-11-17 DIAGNOSIS — R0602 Shortness of breath: Secondary | ICD-10-CM

## 2015-11-17 DIAGNOSIS — K8020 Calculus of gallbladder without cholecystitis without obstruction: Secondary | ICD-10-CM

## 2015-11-17 DIAGNOSIS — E786 Lipoprotein deficiency: Secondary | ICD-10-CM

## 2015-11-17 DIAGNOSIS — C801 Malignant (primary) neoplasm, unspecified: Secondary | ICD-10-CM

## 2015-11-17 DIAGNOSIS — K449 Diaphragmatic hernia without obstruction or gangrene: Secondary | ICD-10-CM

## 2015-11-17 DIAGNOSIS — E785 Hyperlipidemia, unspecified: Secondary | ICD-10-CM

## 2015-11-17 DIAGNOSIS — Z90722 Acquired absence of ovaries, bilateral: Secondary | ICD-10-CM

## 2015-11-17 DIAGNOSIS — K219 Gastro-esophageal reflux disease without esophagitis: Secondary | ICD-10-CM

## 2015-11-17 DIAGNOSIS — I2510 Atherosclerotic heart disease of native coronary artery without angina pectoris: Secondary | ICD-10-CM

## 2015-11-17 DIAGNOSIS — M069 Rheumatoid arthritis, unspecified: Secondary | ICD-10-CM

## 2015-11-17 DIAGNOSIS — Z79899 Other long term (current) drug therapy: Secondary | ICD-10-CM

## 2015-11-17 LAB — COMPREHENSIVE METABOLIC PANEL
ALT: 69 U/L — ABNORMAL HIGH (ref 14–54)
AST: 64 U/L — ABNORMAL HIGH (ref 15–41)
Albumin: 3 g/dL — ABNORMAL LOW (ref 3.5–5.0)
Alkaline Phosphatase: 431 U/L — ABNORMAL HIGH (ref 38–126)
Anion gap: 3 — ABNORMAL LOW (ref 5–15)
BUN: 20 mg/dL (ref 6–20)
CO2: 30 mmol/L (ref 22–32)
Calcium: 9 mg/dL (ref 8.9–10.3)
Chloride: 99 mmol/L — ABNORMAL LOW (ref 101–111)
Creatinine, Ser: 0.81 mg/dL (ref 0.44–1.00)
GFR calc Af Amer: >60 mL/min (ref >60)
GFR calc non Af Amer: >60 mL/min (ref >60)
Glucose, Bld: 136 mg/dL — ABNORMAL HIGH (ref 65–99)
Potassium: 4.4 mmol/L (ref 3.5–5.1)
Sodium: 132 mmol/L — ABNORMAL LOW (ref 135–145)
Total Bilirubin: 0.5 mg/dL (ref 0.3–1.2)
Total Protein: 7.1 g/dL (ref 6.5–8.1)

## 2015-11-17 LAB — CBC WITH DIFFERENTIAL/PLATELET
Basophils Absolute: 0 10*3/uL (ref 0–0.1)
Basophils Relative: 1 %
Eosinophils Absolute: 0 10*3/uL (ref 0–0.7)
Eosinophils Relative: 2 %
HCT: 31.8 % — ABNORMAL LOW (ref 35.0–47.0)
Hemoglobin: 11.3 g/dL — ABNORMAL LOW (ref 12.0–16.0)
Lymphocytes Relative: 36 %
Lymphs Abs: 1 10*3/uL (ref 1.0–3.6)
MCH: 29.3 pg (ref 26.0–34.0)
MCHC: 35.5 g/dL (ref 32.0–36.0)
MCV: 82.7 fL (ref 80.0–100.0)
Monocytes Absolute: 0.5 10*3/uL (ref 0.2–0.9)
Monocytes Relative: 18 %
Neutro Abs: 1.2 10*3/uL — ABNORMAL LOW (ref 1.4–6.5)
Neutrophils Relative %: 43 %
Platelets: 251 10*3/uL (ref 150–440)
RBC: 3.85 MIL/uL (ref 3.80–5.20)
RDW: 14.5 % (ref 11.5–14.5)
WBC: 2.8 10*3/uL — ABNORMAL LOW (ref 3.6–11.0)

## 2015-11-17 LAB — MAGNESIUM: Magnesium: 1.8 mg/dL (ref 1.7–2.4)

## 2015-11-17 MED ORDER — HEPARIN SOD (PORK) LOCK FLUSH 100 UNIT/ML IV SOLN
500.0000 [IU] | Freq: Once | INTRAVENOUS | Status: AC
  Administered 2015-11-17: 500 [IU] via INTRAVENOUS

## 2015-11-17 MED ORDER — HEPARIN SOD (PORK) LOCK FLUSH 100 UNIT/ML IV SOLN
INTRAVENOUS | Status: AC
  Filled 2015-11-17: qty 5

## 2015-11-17 NOTE — Progress Notes (Signed)
[facility] day:  11/17/2015   Chief Complaint: [name] is a 70 y.o. female with stage IIIC ovarian cancer with a history of right sided empyema and liver abscess who is seen for assessment prior to cycle #1 carboplatin and gemcitabine.  HPI:  The patient was last seen in the medical oncology clinic on 11/09/2015.  At that time, chemotherapy was postponed secondary to low counts.  WBC was 2200 with an [place] of 1100.  The etiology of her persistent leukopenia was unclear.  We discussed postponing chemotherapy for 1 week.  If counts did not recover, we discussed bone marrow aspirate and biopsy.  During the interim, she denies any new complaints.  She denies any fever or chills.  She denies any right upper quadrant pain.   Past Medical History  Diagnosis Date  . Leukopenia 2012    s/p bone marrow biopsy, Dr. Ma Hillock  . Cholelithiasis   . GERD (gastroesophageal reflux disease)   . Hypertension   . Hyperlipidemia   . Esophageal stricture   . Chronic diastolic CHF (congestive heart failure) ([place])   . CAD (coronary artery disease)     a. 09/2012 Cath: LM nl, LAD 95p, 83m LCX 966mOM2 50, RCA 100.  . Marland Kitchenypokalemia   . Herniated disc   . Pneumonia 2013; 08/2012    "one lung; double" (10/18/2012)  . Exertional shortness of breath   . Type I diabetes mellitus ([facility]    "dx'd in 1957" (10/18/2012)  . History of pancytopenia   . H/O hiatal hernia   . Rheumatoid arthritis(714.0)   . NSTEMI (non-ST elevated myocardial infarction) ([facility]/2014    "mild" (10/18/2012)  . Collagen vascular disease ([facility]  . [facility]    Past Surgical History  Procedure Laterality Date  . Esophagogastroduodenoscopy  2012    Dr. IfMinna Merritts. Tubal ligation  1970  . Cataract extraction w/ intraocular lens implant Right 2010  . Cardiac catheterization  10/18/2012    "first one was today" (10/18/2012)  . Esophageal dilation      "3 or 4 times" (10/19/2011)   . Coronary artery bypass graft N/A 10/19/2012    Procedure: CORONARY ARTERY BYPASS GRAFTING (CABG);  Surgeon: StMelrose NakayamaMD;  [facility] Service: Open [facility] Surgery;  Laterality: N/A;  . Peripheral vascular catheterization N/A 03/02/2015    Procedure: Porta Cath Insertion;  Surgeon: JaAlgernon HuxleyMD;  [facility];  Service: Cardiovascular;  Laterality: N/A;  . Abdominal hysterectomy  06/15/2015    Dx L/S, EXLAP TAH BSO omentectomy RSRx colostomy diaphragm resection stripping  . Cholecystectomy  06/15/2015    combined case with ovarian cancer debulking  . Colon surgery    . Ostomy      Family History  Problem Relation Age of Onset  . Diabetes Mother   . Arthritis Mother   . Diabetes Father   . Arthritis Father   . Bone cancer Sister     Social History:  reports that she quit smoking about 21 years ago. Her smoking use included Cigarettes. She has a 30 pack-year smoking history. She has never used smokeless tobacco. She reports that she does not drink alcohol or use illicit drugs.  The patient is accompanied by her husband today.  Allergies:  Allergies  Allergen Reactions  . Codeine Nausea And Vomiting  . Latex Rash    Current Medications: Current Outpatient Prescriptions  Medication Sig Dispense Refill  . Calcium-Vitamin D 600-200 MG-UNIT tablet Take 2 tablets by mouth daily.     Marland Kitchen enoxaparin (LOVENOX) 60 MG/0.6ML injection Inject 0.6 mLs (60 mg total) into the skin every 12 (twelve) hours. 60 Syringe 2  . folic acid (FOLVITE) 1 MG tablet TAKE 1 TABLET BY MOUTH ONCE A DAY 90 tablet 3  . furosemide (LASIX) 80 MG tablet Take 80 mg by mouth daily.     . Insulin Human (INSULIN PUMP) SOLN Pt uses Humalog insulin.    Marland Kitchen insulin lispro (HUMALOG) 100 UNIT/ML injection Inject 0.06 mLs (6 Units total) into the skin daily. 30 mL 3  . lidocaine-prilocaine (EMLA) cream Apply 1 application topically as needed (prior to accessing port).    . loratadine  (CLARITIN) 10 MG tablet Take 1 tablet by mouth daily as needed.    Marland Kitchen losartan (COZAAR) 100 MG tablet TAKE 1 TABLET BY MOUTH ONCE A DAY 90 tablet 1  . metoprolol tartrate (LOPRESSOR) 25 MG tablet TAKE 1 TABLET BY MOUTH TWICE A DAY AS NEEDED 180 tablet 3  . ondansetron (ZOFRAN) 8 MG tablet Take 1 tablet (8 mg total) by mouth every 8 (eight) hours as needed for nausea or vomiting. 20 tablet 1  . ONE TOUCH ULTRA TEST test strip     . oxyCODONE (OXY IR/ROXICODONE) 5 MG immediate release tablet Take 5 mg by mouth every 6 (six) hours as needed. Reported on 11/17/2015    . POTASSIUM PO Take by mouth daily.    . predniSONE (DELTASONE) 5 MG tablet Take 5 mg by mouth as needed. Reported on 09/17/2015    . ranitidine (ZANTAC) 150 MG tablet Take 1 tablet (150 mg total) by mouth 2 (two) times daily. 180 tablet 2   No current facility-administered medications for this visit.   Facility-Administered Medications Ordered in Other Visits  Medication Dose Route Frequency Provider Last Rate Last Dose  . sodium chloride 0.9 % injection 10 mL  10 mL Intracatheter PRN Lequita Asal, MD      . sodium chloride 0.9 % injection 10 mL  10 mL Intravenous PRN Lequita Asal, MD   10 mL at 04/13/15 6553    Review of Systems:  GENERAL:  Fatigue.  No fever, chills or sweats.  Weight up 1 pound since last visit. PERFORMANCE STATUS (ECOG):  2 HEENT:  No visual changes, sore throat, mouth sores or tenderness. Lungs:  No shortness of breath or cough.  No hemoptysis. Cardiac:  No chest pain, palpitations, orthopnea, or PND. GI:  Appetite good.  Eating well.  No abdominal pain.  No constipation, diarrhea, melena or hematochezia. GU:  No urgency, frequency, dysuria, or hematuria. Musculoskeletal:  Severe rheumatoid arthritis.  Osteoporosis.  No back pain. No muscle tenderness. Extremities:  No pain or swelling. Skin:  No rashes or skin changes. Neuro:  Neuropathy (stable).  No headache, numbness or weakness, balance or  coordination issues. Endocrine:  Diabetes on an insulin pump.  No thyroid issues, hot flashes or night sweats. Psych:  No mood changes, depression or anxiety. Pain: No pain. Review of systems:  All other systems reviewed and found to be negative.  Physical Exam: Blood pressure 105/54, pulse 74, temperature 97.3 F (36.3 C), temperature source Tympanic, resp. rate 18, weight 127 lb 13.9 oz (58 kg). GENERAL:  Thin woman sitting comfortably in the exam room in no acute distress.  Ambulating with a cane. MENTAL STATUS:  Alert and oriented to person, place and time.  HEAD:  Short gray hair.  Normocephalic, atraumatic, face symmetric, no Cushingoid features. EYES:  Gold rimmed glasses.  Blue eyes.  No conjunctivitis or scleral icterus.  ENT: Oropharynx clear without lesion. Tongue normal. Mucous membranes moist.  RESPIRATORY: Clear to auscultation without rales, wheezes or rhonchi. CARDIOVASCULAR: Regular rate and rhythm without murmur, rub or gallop. ABDOMEN: Soft, non-tender with active bowel sounds and no appreciable hepatosplenomegaly. No palpable nodularity or masses. SKIN: No rashes. EXTREMITIES:  No edema, skin discoloration or tenderness. No palpable cords. LYMPH NODES: No palpable cervical, supraclavicular, axillary or inguinal adenopathy  NEUROLOGICAL: Appropriate. PSYCH: Appropriate.   Infusion on 11/17/2015  Component Date Value Ref Range Status  . Magnesium 11/17/2015 1.8  1.7 - 2.4 mg/dL Final  Appointment on 11/17/2015  Component Date Value Ref Range Status  . WBC 11/17/2015 2.8* 3.6 - 11.0 K/uL Final  . RBC 11/17/2015 3.85  3.80 - 5.20 MIL/uL Final  . Hemoglobin 11/17/2015 11.3* 12.0 - 16.0 g/dL Final  . HCT 11/17/2015 31.8* 35.0 - 47.0 % Final  . MCV 11/17/2015 82.7  80.0 - 100.0 fL Final  . MCH 11/17/2015 29.3  26.0 - 34.0 pg Final  . MCHC 11/17/2015 35.5  32.0 - 36.0 g/dL Final  . RDW 11/17/2015 14.5  11.5 - 14.5 % Final  . Platelets 11/17/2015 251  150 -  440 K/uL Final  . Neutrophils Relative % 11/17/2015 43   Final  . Neutro Abs 11/17/2015 1.2* 1.4 - 6.5 K/uL Final  . Lymphocytes Relative 11/17/2015 36   Final  . Lymphs Abs 11/17/2015 1.0  1.0 - 3.6 K/uL Final  . Monocytes Relative 11/17/2015 18   Final  . Monocytes Absolute 11/17/2015 0.5  0.2 - 0.9 K/uL Final  . Eosinophils Relative 11/17/2015 2   Final  . Eosinophils Absolute 11/17/2015 0.0  0 - 0.7 K/uL Final  . Basophils Relative 11/17/2015 1   Final  . Basophils Absolute 11/17/2015 0.0  0 - 0.1 K/uL Final  . Sodium 11/17/2015 132* 135 - 145 mmol/L Final  . Potassium 11/17/2015 4.4  3.5 - 5.1 mmol/L Final  . Chloride 11/17/2015 99* 101 - 111 mmol/L Final  . CO2 11/17/2015 30  22 - 32 mmol/L Final  . Glucose, Bld 11/17/2015 136* 65 - 99 mg/dL Final  . BUN 11/17/2015 20  6 - 20 mg/dL Final  . Creatinine, Ser 11/17/2015 0.81  0.44 - 1.00 mg/dL Final  . Calcium 11/17/2015 9.0  8.9 - 10.3 mg/dL Final  . Total Protein 11/17/2015 7.1  6.5 - 8.1 g/dL Final  . Albumin 11/17/2015 3.0* 3.5 - 5.0 g/dL Final  . AST 11/17/2015 64* 15 - 41 U/L Final  . ALT 11/17/2015 69* 14 - 54 U/L Final  . Alkaline Phosphatase 11/17/2015 431* 38 - 126 U/L Final  . Total Bilirubin 11/17/2015 0.5  0.3 - 1.2 mg/dL Final  . GFR calc non Af Amer 11/17/2015 >60  >60 mL/min Final  . GFR calc Af Amer 11/17/2015 >60  >60 mL/min Final   Comment: (NOTE) The eGFR has been calculated using the CKD EPI equation. This calculation has not been validated in all clinical situations. eGFR's persistently <60 mL/min signify possible Chronic Kidney Disease.   Georgiann Hahn gap 11/17/2015 3* 5 - 15 Final    Assessment:  [name] is a 70 y.o. female with clinical stage IIIC (T3cN1Mx) ovarian cancer presenting with abdominal discomfort and bloating.  Omental biopsy on 02/23/2015 revealed metastatic high grade serous carcinoma, consistent with gynecologic  origin.  CA125 was 707 on 02/17/2015.  Abdomen and pelvic CT scan on  02/13/2015 revealed bilateral mass-like adnexal regions (right adnexal mass 5.6 x 5.0 cm and the left adnexa mass 10.3 x 6.0 x 9.9 cm).  There was a large amount of soft tissue throughout the peritoneal cavity involving the omentum and other peritoneal surfaces.  There was a small volume ascites. There was a peripheral 3.3 x 1.9 cm low-attenuation lesion overlying the right lobe of the liver , likely representing a serosal implant. There was 1.4 x 2.2 cm ill-defined peripheral lesion within the inferior aspect of segment 6 adjacent to the ampulla in the duodenum.   She has severe rheumatoid arthritis.  Methotrexate and Enbrel are on hold.  She has a normocytic anemia.  Work-up on 02/17/2015 revealed a normal ferritin, B12, folate, TSH.  She denies any melena or hematochezia.  She has diabetes and is on an insulin pump.  She is status post cycle #4 neoadjuvant carboplatin and Taxol (03/05/2015 - 05/22/2015).  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  She had loose stools on oral magnesium.  She was initially on Neurontin then switched Lyrica with cycle #3.  Cycle #4 was notable for neutropenia (ANC 300) requiring GCSF x 3 days.    CA125 was 802.9 on 03/30/2015, 567.9 on 04/13/2015, 168.8 on 05/15/2015, 85.2 on 07/17/2015, 68.6 on 07/28/2015, 34.5 on 09/17/2015, and 20.7 on 11/06/2015.  Abdominal and pelvic CT scan on 04/28/2015 revealed decreasing bilateral ovarian masses.   The left adnexal mass measured 4.0 x 6.1 cm (previously 5.0 x 8.5 cm). The right ovary measured 3.8 x 4.9 cm (previously 4.7 x 5.6 cm).  There was improved peritoneal carcinomatosis.  There was a small amount of ascites.  The hepatic dome lesion was stable. The previously seen right hepatic lobe lesion was not well-visualized.  There was a small left pleural effusion and trace right pleural effusion.  The nodular lesion at the ampulla of Vater, extending into the duodenum was stable.  There was no biliary ductal dilatation.  She  underwent exploratory laparotomy, lysis of adhesions, total abdominal hysterectomy with bilateral salpingo-oophorectomy, infracolic omentectomy, optimal tumor debulking(< 1 cm), recto-sigmoid resection with creation of end colostomy, cholecystectomy, mobilization of splenic flexure and liver with diaphragmatic stripping on 06/15/2015. The right diaphragm was cleared of tumor. During dissection, the diaphragm was entered and closed with sutures.   She has had a recurrent right sided pleural effusion.  She underwent thoracentesis of 650 cc on post-operative day 3.  She was admitted to [facility] on 07/01/2015 and 07/07/2015 for recurrent shortness of breath.  She has undergone 2 additional thoracentesis (1.1 L on 07/02/2015 and 850 cc on 07/08/2015).  Cytology was negative x 2.  Bilateral lower extremity duplex on 07/03/2015 was negative.  Echo on 07/08/2015 revealed en EF of 55-60%.    She has anemia likely due to chronic disease. She received 1 unit PRBCs during her recent admission to [facility]. She denies any melena or hematochezia. Diet is modest, but improving.  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 She is on Lovenox 60 mg twice a day.   She was admitted to [facility] from 07/29/2015 - 08/10/2015 with a right-sided empyema and liver abscess. She underwent CT-guided placement of a liver abscess drain on 07/30/2015. Liver abscess culture grew out group B strep and Enterobacter which was  sensitive to Zosyn. She was transitioned to ertapenem Colbert Ewing) prior to discharge.  She was readmitted to [name] on 09/17/2015.  Chest, abdomen, and pelvic CT scan on 09/01/2015 revealed continued decrease in perihepatic fluid collection contiguous with the right pleural space with percutaneous drain. Drain was removed on 09/01/2015.  Chest, abdomen, and pelvic CT scan on  09/11/2015 revealed a residual versus recurrent fluid collection posterior to the right hepatic lobe (2.6 x 1.1 x 4.0 cm).  Chest, abdomen, and pelvic CT scan on 10/13/2015 revealed resolution of the empyema.    Labs on 11/06/2015 revealed a WBC 2100 with an [place] of 1000.  Folate and MMA were normal.  TSH was 6.13 (high) with a free T4 of 1.17 (0.61-1.12).  Alkaline phosphatase was 348 (38-126) on 11/06/2015 and 431 on 11/17/2015.   Fractionated alkaline phosphatase on 11/09/2015 revealed 21% bone and 79% liver.  Previously her alkaline phosphatase was elevated when she had her liver abscess.  Symptomatically, she has a grade III neuropathy .  She has lost 21 pounds since 07/28/2015.  She has persistent leukopenia.  Alkaline phosphatase is increasing.  Plan: 1.  Labs today:  CBC with diff, CMP, Mg. 2.  Discuss persistent low counts and increasing alkaline phophatase.  Discuss postponing treatment.  [place] appears to slowly be increasing.  Discuss RUQ ultrasound. 3.  No treatment today 4.  RUQ ultrasound today or tomorrow 5.  RTC in 1 week for CBC with diff 6.  RTC in 8 days for MD assess (if counts good), labs (CMP, Mg), and day #1 carboplatin and gemcitabine [cancel if counts not adequate].  Addendum:  RUQ ultrasound on 11/17/2015 revealed mild increase caliber of the common bile duct (9.3 mm).  Consult gastroenterology (Dr. Allen Norris).   Lequita Asal, MD  11/17/2015, 10:11 AM

## 2015-11-17 NOTE — Telephone Encounter (Signed)
Called patient at the request of dr. Mike Gip that the u/s did not show anything that would give hear reason why she has the elevated alkaline phosphatase levels.  She spoke to Dr. Allen Norris who is GI specialist and he believes he can determine what test may be best to do to determine the cause of the abnormal test.  His office will call pt and give her an appt to see Dr. Allen Norris in the next  2 days.  Patient is agreeable to this and was told that if she does not hear from his office with appt by wed. Afternoon to call me back and we will check back with their office again. She is ok with this also.

## 2015-11-17 NOTE — Progress Notes (Signed)
Patient is here for follow up, no complaints today. Have we talked with her endocrinology. Also asking about blood work

## 2015-11-20 ENCOUNTER — Other Ambulatory Visit: Payer: Self-pay | Admitting: *Deleted

## 2015-11-20 ENCOUNTER — Telehealth: Payer: Self-pay | Admitting: *Deleted

## 2015-11-20 ENCOUNTER — Telehealth: Payer: Self-pay

## 2015-11-20 MED ORDER — INSULIN LISPRO 100 UNIT/ML ~~LOC~~ SOLN: 6.0000 [IU] | Freq: Every day | SUBCUTANEOUS | Status: AC

## 2015-11-20 NOTE — Telephone Encounter (Signed)
Patient is on the list for [employer] 2017 and may be a good candidate for an AWV in 2017. Please let me know if/when appt is scheduled.   

## 2015-11-20 NOTE — Telephone Encounter (Signed)
This was completed already, thanks

## 2015-11-20 NOTE — Telephone Encounter (Signed)
[name] has requested a refill for Humalog (insulin Pump) 3 vials

## 2015-11-23 ENCOUNTER — Telehealth: Payer: Self-pay | Admitting: Internal Medicine

## 2015-11-23 NOTE — Telephone Encounter (Signed)
Spoke with the pharmacist, patient told them she is taking more then the 6units a daily and needs a script for 3 vials which the insurance will not currently cover with 6 units daily.  Can we change the prescription to read follow sliding scale so that she can get the increase? Or what is her daily dose suppose to be?

## 2015-11-23 NOTE — Telephone Encounter (Signed)
She follows with endocrinology. She should get this prescription from them, as they may have changed medication dosing.

## 2015-11-23 NOTE — Telephone Encounter (Signed)
Left a message with [place] at the Pharmacy to call Endocrine, Lucilla Lame at [facility] for Rx.

## 2015-11-23 NOTE — Telephone Encounter (Signed)
Pharmacy called stating that they need clarification on the directions for  insulin lispro (HUMALOG) 100 UNIT/ML injection.. Please advise

## 2015-11-23 NOTE — Progress Notes (Signed)
Please make sure this patient has an office visit.

## 2015-11-24 ENCOUNTER — Other Ambulatory Visit: Payer: Medicare Other

## 2015-11-24 ENCOUNTER — Other Ambulatory Visit: Payer: Self-pay | Admitting: *Deleted

## 2015-11-24 ENCOUNTER — Ambulatory Visit: Payer: Medicare Other | Admitting: Hematology and Oncology

## 2015-11-24 ENCOUNTER — Inpatient Hospital Stay: Payer: Medicare Other

## 2015-11-24 ENCOUNTER — Other Ambulatory Visit: Payer: Self-pay | Admitting: Internal Medicine

## 2015-11-24 ENCOUNTER — Telehealth: Payer: Self-pay | Admitting: *Deleted

## 2015-11-24 ENCOUNTER — Ambulatory Visit: Payer: Medicare Other

## 2015-11-24 DIAGNOSIS — C801 Malignant (primary) neoplasm, unspecified: Secondary | ICD-10-CM

## 2015-11-24 DIAGNOSIS — D6489 Other specified anemias: Secondary | ICD-10-CM

## 2015-11-24 DIAGNOSIS — C786 Secondary malignant neoplasm of retroperitoneum and peritoneum: Secondary | ICD-10-CM

## 2015-11-24 DIAGNOSIS — G629 Polyneuropathy, unspecified: Secondary | ICD-10-CM

## 2015-11-24 DIAGNOSIS — C569 Malignant neoplasm of unspecified ovary: Secondary | ICD-10-CM

## 2015-11-24 DIAGNOSIS — D72819 Decreased white blood cell count, unspecified: Secondary | ICD-10-CM

## 2015-11-24 DIAGNOSIS — R748 Abnormal levels of other serum enzymes: Secondary | ICD-10-CM

## 2015-11-24 DIAGNOSIS — D709 Neutropenia, unspecified: Secondary | ICD-10-CM

## 2015-11-24 LAB — CBC WITH DIFFERENTIAL/PLATELET
Basophils Absolute: 0 10*3/uL (ref 0–0.1)
Basophils Relative: 2 %
Eosinophils Absolute: 0 10*3/uL (ref 0–0.7)
Eosinophils Relative: 2 %
HCT: 34.1 % — ABNORMAL LOW (ref 35.0–47.0)
Hemoglobin: 11.8 g/dL — ABNORMAL LOW (ref 12.0–16.0)
Lymphocytes Relative: 46 %
Lymphs Abs: 1 10*3/uL (ref 1.0–3.6)
MCH: 28.9 pg (ref 26.0–34.0)
MCHC: 34.7 g/dL (ref 32.0–36.0)
MCV: 83.2 fL (ref 80.0–100.0)
Monocytes Absolute: 0.4 10*3/uL (ref 0.2–0.9)
Monocytes Relative: 19 %
Neutro Abs: 0.7 10*3/uL — ABNORMAL LOW (ref 1.4–6.5)
Neutrophils Relative %: 31 %
Platelets: 177 10*3/uL (ref 150–440)
RBC: 4.1 MIL/uL (ref 3.80–5.20)
RDW: 14.9 % — ABNORMAL HIGH (ref 11.5–14.5)
WBC: 2.2 10*3/uL — ABNORMAL LOW (ref 3.6–11.0)

## 2015-11-24 NOTE — Telephone Encounter (Signed)
Called patient and let her know that pt neutrophil count lower than it was last week so we will cancel the appt for this thursday as discussed last week.  She is agreeable to BM bx as discussed last wk.  I did call Dr. Allen Norris office because we have not heard back about appt and she has appt for 8/1 arrive 12:45 and it is in [place] office.  The pt is agreeable to the appt with Gi amd te B bx. I have filled out sheet and faxed to sch. To arrange date for BM bx.  After date of bx then pt wil lneed f/u in 10 days after bx

## 2015-11-24 NOTE — Telephone Encounter (Signed)
-----   Message from Lequita Asal, MD sent at 11/24/2015  1:23 PM EDT ----- Regarding: Please call patient   Patient neutropenic.  Neutropenic precautions. No chemo.  Bone marrow.  M ----- Message ----- From: Interface, Lab In [place] Sent: 11/24/2015  10:01 AM To: Lequita Asal, MD

## 2015-11-25 ENCOUNTER — Telehealth: Payer: Self-pay | Admitting: *Deleted

## 2015-11-25 NOTE — Telephone Encounter (Signed)
Noted. Will follow as appropriate.  

## 2015-11-26 ENCOUNTER — Inpatient Hospital Stay: Payer: Medicare Other | Admitting: Hematology and Oncology

## 2015-11-26 ENCOUNTER — Inpatient Hospital Stay: Payer: Medicare Other

## 2015-11-30 ENCOUNTER — Telehealth: Payer: Self-pay | Admitting: *Deleted

## 2015-11-30 MED ORDER — POTASSIUM CHLORIDE CRYS ER 20 MEQ PO TBCR: 40.0000 meq | EXTENDED_RELEASE_TABLET | Freq: Every day | ORAL | 3 refills | Status: DC

## 2015-11-30 NOTE — Telephone Encounter (Signed)
Pt called and said about potassium 20 meq 2 tablets a day is wha tis on her bottle and dr secord did rx for several months but because she does not see pt hardly ever she wants to know if corcoran will take it over and corcoran said yes and I called pt back and left her a message on her voicemail and then called [place] and called it in on voicemail.

## 2015-12-01 ENCOUNTER — Other Ambulatory Visit: Payer: Self-pay | Admitting: General Surgery

## 2015-12-01 ENCOUNTER — Encounter: Payer: Self-pay | Admitting: Gastroenterology

## 2015-12-01 ENCOUNTER — Other Ambulatory Visit: Payer: Self-pay | Admitting: *Deleted

## 2015-12-01 ENCOUNTER — Other Ambulatory Visit: Payer: Self-pay

## 2015-12-01 ENCOUNTER — Ambulatory Visit (INDEPENDENT_AMBULATORY_CARE_PROVIDER_SITE_OTHER): Payer: Medicare Other | Admitting: Gastroenterology

## 2015-12-01 VITALS — BP 129/56 | HR 78 | Temp 98.2°F | Ht 63.0 in | Wt 129.0 lb

## 2015-12-01 DIAGNOSIS — R748 Abnormal levels of other serum enzymes: Secondary | ICD-10-CM | POA: Diagnosis not present

## 2015-12-01 DIAGNOSIS — K750 Abscess of liver: Secondary | ICD-10-CM

## 2015-12-01 NOTE — Patient Instructions (Signed)
You are scheduled for an MRCP at [facility] on Monday, August 14th @ 9:00am. Please check in at the Medical mall entrance at 8:30am. You are to be NPO (nothing to eat or drink) after midnight Sunday night.

## 2015-12-01 NOTE — Progress Notes (Signed)
Gastroenterology Consultation  Referring Provider:     Jackolyn Confer, MD Primary Care Physician:  Rica Mast, MD Primary Gastroenterologist:  Dr. Allen Norris     Reason for Consultation:     Elevated alkaline phosphatase        HPI:   [name] is a 70 y.o. y/o female referred for consultation & management of Elevated alkaline phosphatase by Dr. Rica Mast, MD.  This patient comes today with a finding of increased alk phosphatase. The patient had this in the past when she had a liver abscess. The patient has a history of ovarian cancer with surgery for that including gallbladder removal hysterectomy and subsequent development of an abscess in the liver. The patient was treated with antibiotics and states that the abscess went away. The patient recently had imaging that showed her common bile duct to be dilated at 9.8 mm. The patient denies any nausea vomiting fevers chills or abdominal pain. The patient also had her liver enzymes checked that showed her AST to be 64 and ALT to be 69. The fractionation of the alkaline phosphatase showed the majority of it to be from the liver. The patient does report that she has had some itching  Past Medical History:  Diagnosis Date  . CAD (coronary artery disease)    a. 09/2012 Cath: LM nl, LAD 95p, 64m LCX 975mOM2 50, RCA 100.  . Marland Kitchenholelithiasis   . Chronic diastolic CHF (congestive heart failure) (HC[facility] [place]  . Collagen vascular disease ([place]  . Esophageal stricture   . Exertional shortness of breath   . GERD (gastroesophageal reflux disease)   . H/O hiatal hernia   . Herniated disc   . History of pancytopenia   . Hyperlipidemia   . Hypertension   . Hypokalemia   . Leukopenia 2012   s/p bone marrow biopsy, Dr. PaMa Hillock. NSTEMI (non-ST elevated myocardial infarction) (HCLaclede5/2014   "mild" (10/18/2012)  . Ovarian cancer ([place]  . Pneumonia 2013; 08/2012   "one lung; double" (10/18/2012)  . Rheumatoid arthritis(714.0)   .  Type I diabetes mellitus (HCEden[place]   "dx'd in 1957" (10/18/2012)    Past Surgical History:  Procedure Laterality Date  . ABDOMINAL HYSTERECTOMY  06/15/2015   Dx L/S, EXLAP TAH BSO omentectomy RSRx colostomy diaphragm resection stripping  . CARDIAC CATHETERIZATION  10/18/2012   "first one was today" (10/18/2012)  . CATARACT EXTRACTION W/ INTRAOCULAR LENS IMPLANT Right 2010  . CHOLECYSTECTOMY  06/15/2015   combined case with ovarian cancer debulking  . COLON SURGERY    . CORONARY ARTERY BYPASS GRAFT N/A 10/19/2012   Procedure: CORONARY ARTERY BYPASS GRAFTING (CABG);  Surgeon: StMelrose NakayamaMD;  [facility] Service: [facility] Surgery;  Laterality: N/A;  . ESOPHAGEAL DILATION     "3 or 4 times" (10/19/2011)  . ESOPHAGOGASTRODUODENOSCOPY  2012   Dr. IfMinna Merritts. OSTOMY    . PERIPHERAL VASCULAR CATHETERIZATION N/A 03/02/2015   Procedure: PoGlori Luisath Insertion;  Surgeon: JaAlgernon HuxleyMD;  [facility];  Service: Cardiovascular;  Laterality: N/A;  . [facility]  Prior to Admission medications   Medication Sig Start Date End Date Taking? Authorizing Provider  Calcium-Vitamin D 600-200 MG-UNIT tablet Take 2 tablets by mouth daily.    Yes Historical Provider, MD  folic acid (FOLVITE) 1 MG tablet TAKE 1 TABLET BY MOUTH ONCE A DAY 09/02/15  Yes JeJackolyn ConferMD  furosemide (LASIX) 80 MG tablet Take 80 mg by mouth daily.  08/10/15  Yes Historical Provider, MD  Insulin Human (INSULIN PUMP) SOLN Pt uses Humalog insulin.   Yes Historical Provider, MD  insulin lispro (HUMALOG) 100 UNIT/ML injection Inject 0.06 mLs (6 Units total) into the skin daily. 11/20/15  Yes Jackolyn Confer, MD  lidocaine-prilocaine (EMLA) cream Apply 1 application topically as needed (prior to accessing port).   Yes Historical Provider, MD  loratadine (CLARITIN) 10 MG tablet Take 1 tablet by mouth daily as needed.   Yes Historical Provider, MD  losartan (COZAAR) 100 MG tablet TAKE 1 TABLET BY  MOUTH ONCE A DAY 07/27/15  Yes Jackolyn Confer, MD  metoprolol tartrate (LOPRESSOR) 25 MG tablet TAKE 1 TABLET BY MOUTH TWICE A DAY AS NEEDED 08/17/15  Yes Jackolyn Confer, MD  ondansetron (ZOFRAN) 8 MG tablet Take 1 tablet (8 mg total) by mouth every 8 (eight) hours as needed for nausea or vomiting. 03/06/15  Yes Lequita Asal, MD  ONE TOUCH ULTRA TEST test strip  11/09/15  Yes Historical Provider, MD  oxyCODONE (OXY IR/ROXICODONE) 5 MG immediate release tablet Take 5 mg by mouth every 6 (six) hours as needed. Reported on 11/17/2015 08/11/15  Yes Historical Provider, MD  pantoprazole (PROTONIX) 40 MG tablet TAKE 1 TABLET BY MOUTH TWICE A DAY 11/24/15  Yes Jackolyn Confer, MD  potassium chloride SA (K-DUR,KLOR-CON) 20 MEQ tablet Take 2 tablets (40 mEq total) by mouth daily. 11/30/15  Yes Lequita Asal, MD  POTASSIUM PO Take by mouth daily.   Yes Historical Provider, MD  predniSONE (DELTASONE) 5 MG tablet Take 5 mg by mouth as needed. Reported on 09/17/2015 07/07/15  Yes Historical Provider, MD  ranitidine (ZANTAC) 150 MG tablet Take 1 tablet (150 mg total) by mouth 2 (two) times daily. 11/13/14  Yes Jackolyn Confer, MD  enoxaparin (LOVENOX) 60 MG/0.6ML injection Inject 0.6 mLs (60 mg total) into the skin every 12 (twelve) hours. 09/10/15 11/30/15  Lequita Asal, MD    Family History  Problem Relation Age of Onset  . Diabetes Mother   . Arthritis Mother   . Diabetes Father   . Arthritis Father   . Bone cancer Sister      Social History  Substance Use Topics  . Smoking status: Former Smoker    Packs/day: 1.00    Years: 30.00    Types: Cigarettes    Quit date: 06/11/1994  . Smokeless tobacco: Never Used  . Alcohol use No    Allergies as of 12/01/2015 - Review Complete 12/01/2015  Allergen Reaction Noted  . Codeine Nausea And Vomiting 01/10/2011  . Latex Rash 11/17/2015    Review of Systems:    All systems reviewed and negative except where noted in HPI.   Physical Exam:   BP (!) 129/56   Pulse 78   Temp 98.2 F (36.8 C) (Oral)   Ht 5' 3"  (1.6 m)   Wt 129 lb (58.5 kg)   BMI 22.85 kg/m  No LMP recorded. Patient has had a hysterectomy. Psych:  Alert and cooperative. Normal mood and affect. General:   Alert,  Well-developed, well-nourished, pleasant and cooperative in NAD Head:  Normocephalic and atraumatic. Eyes:  Sclera clear, no icterus.   Conjunctiva pink. Ears:  Normal auditory acuity. Nose:  No deformity, discharge, or lesions. Mouth:  No deformity or lesions,oropharynx pink & moist. Neck:  Supple; no masses or thyromegaly. Lungs:  Respirations even and unlabored.  Clear throughout to auscultation.   No wheezes, crackles, or rhonchi. No acute distress. Heart:  Regular rate and rhythm; no murmurs, clicks, rubs, or gallops. Abdomen:  Normal bowel sounds.  No bruits.  Soft, non-tender and non-distended without masses, hepatosplenomegaly or hernias noted.  No guarding or rebound tenderness.  Negative Carnett sign.   Rectal:  Deferred.  Msk:  Symmetrical without gross deformities.  Good, equal movement & strength bilaterally. Pulses:  Normal pulses noted. Extremities:  No clubbing or edema.  No cyanosis. Neurologic:  Alert and oriented x3;  grossly normal neurologically. Skin:  Intact without significant lesions or rashes.  No jaundice. Lymph Nodes:  No significant cervical adenopathy. Psych:  Alert and cooperative. Normal mood and affect.  Imaging Studies: US Abdomen Limited Ruq  Result Date: 11/17/2015 CLINICAL DATA:  Elevated alkaline phosphatase EXAM: US ABDOMEN LIMITED - RIGHT UPPER QUADRANT COMPARISON:  07/08/2015 FINDINGS: Gallbladder: Previous cholecystectomy. Common bile duct: Diameter: 9.3 Liver: No focal lesion identified. Within normal limits in parenchymal echogenicity. Trace perihepatic ascites noted. IMPRESSION: 1. Previous cholecystectomy. Mild increase caliber of the common bile duct which measures 9.3 mm. Electronically Signed   By:  Kerby Moors M.D.   On: 11/17/2015 11:33    Assessment and Plan:   INFANTOF VILLAGOMEZ is a 70 y.o. y/o female with an increase alkaline phosphatase with elevated AST and ALT. The patient has a history of increased alk phosphatase in the past with a liver abscess. Recent imaging does not show any abscess but did show a dilated common bile duct. The patient had her gallbladder out in February but this is a relatively short time for postcholecystectomy ductal dilatation. The patient also has itching associated with the increased alk phosphatase and will have her antibiotic chondral antibody sent off today. The patient will also be set up for an MRCP to look at the common bile duct for possible stricture or other obstruction. The patient's medications can also be a cause for her intrahepatic cholestasis. The patient has been explained the plan and agrees with it.   Note: This dictation was prepared with Dragon dictation along with smaller phrase technology. Any transcriptional errors that result from this process are unintentional.

## 2015-12-02 ENCOUNTER — Encounter: Payer: Self-pay | Admitting: Hematology and Oncology

## 2015-12-02 ENCOUNTER — Ambulatory Visit: Admission: RE | Admit: 2015-12-02 | Payer: Medicare Other | Source: Ambulatory Visit

## 2015-12-02 ENCOUNTER — Ambulatory Visit
Admission: RE | Admit: 2015-12-02 | Discharge: 2015-12-02 | Disposition: A | Discharge status: Home / Self Care | Payer: Medicare Other | Source: Ambulatory Visit | Attending: Hematology and Oncology | Admitting: Hematology and Oncology

## 2015-12-02 ENCOUNTER — Other Ambulatory Visit: Payer: Self-pay

## 2015-12-02 DIAGNOSIS — I2510 Atherosclerotic heart disease of native coronary artery without angina pectoris: Secondary | ICD-10-CM | POA: Insufficient documentation

## 2015-12-02 DIAGNOSIS — Z885 Allergy status to narcotic agent status: Secondary | ICD-10-CM | POA: Insufficient documentation

## 2015-12-02 DIAGNOSIS — D709 Neutropenia, unspecified: Secondary | ICD-10-CM | POA: Diagnosis present

## 2015-12-02 DIAGNOSIS — Z90722 Acquired absence of ovaries, bilateral: Secondary | ICD-10-CM | POA: Diagnosis not present

## 2015-12-02 DIAGNOSIS — K8020 Calculus of gallbladder without cholecystitis without obstruction: Secondary | ICD-10-CM | POA: Insufficient documentation

## 2015-12-02 DIAGNOSIS — I110 Hypertensive heart disease with heart failure: Secondary | ICD-10-CM | POA: Insufficient documentation

## 2015-12-02 DIAGNOSIS — K449 Diaphragmatic hernia without obstruction or gangrene: Secondary | ICD-10-CM | POA: Insufficient documentation

## 2015-12-02 DIAGNOSIS — I5032 Chronic diastolic (congestive) heart failure: Secondary | ICD-10-CM | POA: Insufficient documentation

## 2015-12-02 DIAGNOSIS — Z794 Long term (current) use of insulin: Secondary | ICD-10-CM | POA: Diagnosis not present

## 2015-12-02 DIAGNOSIS — M069 Rheumatoid arthritis, unspecified: Secondary | ICD-10-CM | POA: Insufficient documentation

## 2015-12-02 DIAGNOSIS — Z808 Family history of malignant neoplasm of other organs or systems: Secondary | ICD-10-CM | POA: Insufficient documentation

## 2015-12-02 DIAGNOSIS — Z833 Family history of diabetes mellitus: Secondary | ICD-10-CM | POA: Insufficient documentation

## 2015-12-02 DIAGNOSIS — Z9841 Cataract extraction status, right eye: Secondary | ICD-10-CM | POA: Insufficient documentation

## 2015-12-02 DIAGNOSIS — E109 Type 1 diabetes mellitus without complications: Secondary | ICD-10-CM | POA: Insufficient documentation

## 2015-12-02 DIAGNOSIS — E785 Hyperlipidemia, unspecified: Secondary | ICD-10-CM | POA: Insufficient documentation

## 2015-12-02 DIAGNOSIS — C786 Secondary malignant neoplasm of retroperitoneum and peritoneum: Secondary | ICD-10-CM | POA: Diagnosis not present

## 2015-12-02 DIAGNOSIS — C569 Malignant neoplasm of unspecified ovary: Secondary | ICD-10-CM | POA: Insufficient documentation

## 2015-12-02 DIAGNOSIS — Z933 Colostomy status: Secondary | ICD-10-CM | POA: Diagnosis not present

## 2015-12-02 DIAGNOSIS — Z90710 Acquired absence of both cervix and uterus: Secondary | ICD-10-CM | POA: Diagnosis not present

## 2015-12-02 DIAGNOSIS — Z87891 Personal history of nicotine dependence: Secondary | ICD-10-CM | POA: Insufficient documentation

## 2015-12-02 DIAGNOSIS — Z9049 Acquired absence of other specified parts of digestive tract: Secondary | ICD-10-CM | POA: Insufficient documentation

## 2015-12-02 DIAGNOSIS — K219 Gastro-esophageal reflux disease without esophagitis: Secondary | ICD-10-CM | POA: Diagnosis not present

## 2015-12-02 DIAGNOSIS — D61818 Other pancytopenia: Secondary | ICD-10-CM | POA: Insufficient documentation

## 2015-12-02 DIAGNOSIS — I252 Old myocardial infarction: Secondary | ICD-10-CM | POA: Diagnosis not present

## 2015-12-02 DIAGNOSIS — Z951 Presence of aortocoronary bypass graft: Secondary | ICD-10-CM | POA: Diagnosis not present

## 2015-12-02 DIAGNOSIS — Z91040 Latex allergy status: Secondary | ICD-10-CM | POA: Insufficient documentation

## 2015-12-02 DIAGNOSIS — E876 Hypokalemia: Secondary | ICD-10-CM | POA: Insufficient documentation

## 2015-12-02 DIAGNOSIS — C801 Malignant (primary) neoplasm, unspecified: Secondary | ICD-10-CM | POA: Diagnosis not present

## 2015-12-02 DIAGNOSIS — D708 Other neutropenia: Secondary | ICD-10-CM | POA: Diagnosis not present

## 2015-12-02 DIAGNOSIS — Z8543 Personal history of malignant neoplasm of ovary: Secondary | ICD-10-CM | POA: Insufficient documentation

## 2015-12-02 DIAGNOSIS — Z7902 Long term (current) use of antithrombotics/antiplatelets: Secondary | ICD-10-CM | POA: Insufficient documentation

## 2015-12-02 DIAGNOSIS — Z8261 Family history of arthritis: Secondary | ICD-10-CM | POA: Insufficient documentation

## 2015-12-02 LAB — GLUCOSE, CAPILLARY
Glucose-Capillary: 162 mg/dL — ABNORMAL HIGH (ref 65–99)
Glucose-Capillary: 86 mg/dL (ref 65–99)

## 2015-12-02 LAB — PROTIME-INR
INR: 0.99
PROTHROMBIN TIME: 13.1 s (ref 11.4–15.2)

## 2015-12-02 LAB — DIFFERENTIAL
Basophils Absolute: 0 10*3/uL (ref 0–0.1)
Basophils Relative: 2 %
Eosinophils Absolute: 0 10*3/uL (ref 0–0.7)
Eosinophils Relative: 3 %
Lymphocytes Relative: 44 %
Lymphs Abs: 0.7 10*3/uL — ABNORMAL LOW (ref 1.0–3.6)
Monocytes Absolute: 0.5 10*3/uL (ref 0.2–0.9)
Monocytes Relative: 31 %
Neutro Abs: 0.3 10*3/uL — ABNORMAL LOW (ref 1.4–6.5)
Neutrophils Relative %: 20 %

## 2015-12-02 LAB — CBC
HCT: 32.1 % — ABNORMAL LOW (ref 35.0–47.0)
HEMOGLOBIN: 11.4 g/dL — AB (ref 12.0–16.0)
MCH: 29.8 pg (ref 26.0–34.0)
MCHC: 35.6 g/dL (ref 32.0–36.0)
MCV: 83.7 fL (ref 80.0–100.0)
Platelets: 158 10*3/uL (ref 150–440)
RBC: 3.83 MIL/uL (ref 3.80–5.20)
RDW: 15.7 % — ABNORMAL HIGH (ref 11.5–14.5)
WBC: 1.6 10*3/uL — ABNORMAL LOW (ref 3.6–11.0)

## 2015-12-02 LAB — APTT: APTT: 45 s — AB (ref 24–36)

## 2015-12-02 MED ORDER — SODIUM CHLORIDE 0.9 % IV SOLN
INTRAVENOUS | Status: DC
  Administered 2015-12-02: 09:00:00 via INTRAVENOUS

## 2015-12-02 MED ORDER — FENTANYL CITRATE (PF) 100 MCG/2ML IJ SOLN
INTRAMUSCULAR | Status: AC | PRN
  Administered 2015-12-02: 50 ug via INTRAVENOUS

## 2015-12-02 MED ORDER — MIDAZOLAM HCL 2 MG/2ML IJ SOLN
INTRAMUSCULAR | Status: AC | PRN
  Administered 2015-12-02: 1 mg via INTRAVENOUS

## 2015-12-02 NOTE — Discharge Instructions (Signed)
Bone Marrow Aspiration and Bone Marrow Biopsy, Care After °Refer to this sheet in the next few weeks. These instructions provide you with information about caring for yourself after your procedure. Your health care provider may also give you more specific instructions. Your treatment has been planned according to current medical practices, but problems sometimes occur. Call your health care provider if you have any problems or questions after your procedure. °WHAT TO EXPECT AFTER THE PROCEDURE °After your procedure, it is common to have: °· Soreness or tenderness around the puncture site. °· Bruising. °HOME CARE INSTRUCTIONS °· Take medicines only as directed by your health care provider. °· Follow your health care provider's instructions about: °¨ Puncture site care. °¨ Bandage (dressing) changes and removal. °· Bathe and shower as directed by your health care provider. °· Check your puncture site every day for signs of infection. Watch for: °¨ Redness, swelling, or pain. °¨ Fluid, blood, or pus. °· Return to your normal activities as directed by your health care provider. °· Keep all follow-up visits as directed by your health care provider. This is important. °SEEK MEDICAL CARE IF: °· You have a fever. °· You have uncontrollable bleeding. °· You have redness, swelling, or pain at the site of your puncture. °· You have fluid, blood, or pus coming from your puncture site. °  °This information is not intended to replace advice given to you by your health care provider. Make sure you discuss any questions you have with your health care provider. °  °Document Released: 11/05/2004 Document Revised: 09/02/2014 Document Reviewed: 04/09/2014 °Elsevier Interactive Patient Education ©2016 Elsevier Inc. ° °

## 2015-12-02 NOTE — Procedures (Signed)
Stage III ovarian carcinoma, persistent neutropenia. Receiving chemotherapy.  CT-guided right iliac bone marrow aspiration and core biopsy  No immediate complication  Pathology pending  EBL 0  For report in PACs

## 2015-12-02 NOTE — H&P (Signed)
Chief Complaint: Persistent neutropenia. Receiving chemotherapy for ovarian cancer.   Referring Physician(s): Corcoran,Melissa C  History of Present Illness: [name] is a 70 y.o. female with a prior history of ovarian cancer. Patient status post laparotomy for complete hysterectomy. She also has a colostomy following the surgery. She is currently undergoing a chemotherapy for the ovarian cancer. This has been stopped secondary to persistent neutropenia, very slow to resolve. She is here today for bone marrow biopsy.  Past Medical History:  Diagnosis Date  . CAD (coronary artery disease)    a. 09/2012 Cath: LM nl, LAD 95p, 50m LCX 917mOM2 50, RCA 100.  . Marland Kitchenholelithiasis   . Chronic diastolic CHF (congestive heart failure) (HC[place]  . Collagen vascular disease (HCTobias  . Esophageal stricture   . Exertional shortness of breath   . GERD (gastroesophageal reflux disease)   . H/O hiatal hernia   . Herniated disc   . History of pancytopenia   . Hyperlipidemia   . Hypertension   . Hypokalemia   . Leukopenia 2012   s/p bone marrow biopsy, Dr. PaMa Hillock. NSTEMI (non-ST elevated myocardial infarction) (HCOgdensburg5/2014   "mild" (10/18/2012)  . Ovarian cancer (HCE. [place]  . Pneumonia 2013; 08/2012   "one lung; double" (10/18/2012)  . Rheumatoid arthritis(714.0)   . Type I diabetes mellitus (HCWil[place]   "dx'd in 1957" (10/18/2012)    Past Surgical History:  Procedure Laterality Date  . ABDOMINAL HYSTERECTOMY  06/15/2015   Dx L/S, EXLAP TAH BSO omentectomy RSRx colostomy diaphragm resection stripping  . CARDIAC CATHETERIZATION  10/18/2012   "first one was today" (10/18/2012)  . CATARACT EXTRACTION W/ INTRAOCULAR LENS IMPLANT Right 2010  . CHOLECYSTECTOMY  06/15/2015   combined case with ovarian cancer debulking  . COLON SURGERY    . CORONARY ARTERY BYPASS GRAFT N/A 10/19/2012   Procedure: CORONARY ARTERY BYPASS GRAFTING (CABG);  Surgeon: StMelrose NakayamaMD;  [facility]  Service: Open Heart Surgery;  Laterality: N/A;  . ESOPHAGEAL DILATION     "3 or 4 times" (10/19/2011)  . ESOPHAGOGASTRODUODENOSCOPY  2012   Dr. IfMinna Merritts. OSTOMY    . PERIPHERAL VASCULAR CATHETERIZATION N/A 03/02/2015   Procedure: PoGlori Luisath Insertion;  Surgeon: JaAlgernon HuxleyMD;  [facility];  Service: Cardiovascular;  Laterality: N/A;  . TUBAL LIGATION  1970    Allergies: Codeine and Latex  Medications: Prior to Admission medications   Medication Sig Start Date End Date Taking? Authorizing Provider  Calcium-Vitamin D 600-200 MG-UNIT tablet Take 2 tablets by mouth daily.    Yes Historical Provider, MD  enoxaparin (LOVENOX) 60 MG/0.6ML injection Inject 60 mg into the skin. Held last pm and this am dose for bone marrow biopsy   Yes Historical Provider, MD  folic acid (FOLVITE) 1 MG tablet TAKE 1 TABLET BY MOUTH ONCE A DAY 09/02/15  Yes JeJackolyn ConferMD  Insulin Human (INSULIN PUMP) SOLN Pt uses Humalog insulin.   Yes Historical Provider, MD  insulin lispro (HUMALOG) 100 UNIT/ML injection Inject 0.06 mLs (6 Units total) into the skin daily. 11/20/15  Yes JeJackolyn ConferMD  lidocaine-prilocaine (EMLA) cream Apply 1 application topically as needed (prior to accessing port).   Yes Historical Provider, MD  loratadine (CLARITIN) 10 MG tablet Take 1 tablet by mouth daily as needed.   Yes Historical Provider, MD  losartan (COZAAR) 100 MG tablet TAKE 1 TABLET BY  MOUTH ONCE A DAY 07/27/15  Yes Jackolyn Confer, MD  metoprolol tartrate (LOPRESSOR) 25 MG tablet TAKE 1 TABLET BY MOUTH TWICE A DAY AS NEEDED 08/17/15  Yes Jackolyn Confer, MD  ondansetron (ZOFRAN) 8 MG tablet Take 1 tablet (8 mg total) by mouth every 8 (eight) hours as needed for nausea or vomiting. 03/06/15  Yes Lequita Asal, MD  ONE TOUCH ULTRA TEST test strip  11/09/15  Yes Historical Provider, MD  pantoprazole (PROTONIX) 40 MG tablet TAKE 1 TABLET BY MOUTH TWICE A DAY 11/24/15  Yes Jackolyn Confer, MD    potassium chloride SA (K-DUR,KLOR-CON) 20 MEQ tablet Take 2 tablets (40 mEq total) by mouth daily. 11/30/15  Yes Lequita Asal, MD  ranitidine (ZANTAC) 150 MG tablet Take 1 tablet (150 mg total) by mouth 2 (two) times daily. Patient taking differently: Take 150 mg by mouth 2 (two) times daily as needed.  11/13/14  Yes Jackolyn Confer, MD  enoxaparin (LOVENOX) 60 MG/0.6ML injection Inject 0.6 mLs (60 mg total) into the skin every 12 (twelve) hours. 09/10/15 11/30/15  Lequita Asal, MD  furosemide (LASIX) 80 MG tablet Take 80 mg by mouth daily as needed.  08/10/15   Historical Provider, MD  oxyCODONE (OXY IR/ROXICODONE) 5 MG immediate release tablet Take 5 mg by mouth every 6 (six) hours as needed. Reported on 11/17/2015 08/11/15   Historical Provider, MD  POTASSIUM PO Take by mouth daily.    Historical Provider, MD  predniSONE (DELTASONE) 5 MG tablet Take 5 mg by mouth as needed. Reported on 09/17/2015 07/07/15   Historical Provider, MD     Family History  Problem Relation Age of Onset  . Diabetes Mother   . Arthritis Mother   . Diabetes Father   . Arthritis Father   . Bone cancer Sister     Social History   Social History  . Marital status: Married    Spouse name: N/A  . Number of children: 3  . Years of education: N/A   Occupational History  . Retired Retired   Social History Main Topics  . Smoking status: Former Smoker    Packs/day: 1.00    Years: 30.00    Types: Cigarettes    Quit date: 06/11/1994  . Smokeless tobacco: Never Used  . Alcohol use No  . Drug use: No  . Sexual activity: No   Other Topics Concern  . None   Social History Narrative  . None    ECOG Status: 2 - Symptomatic, <50% confined to bed  Review of Systems: A 12 point ROS discussed and pertinent positives are indicated in the HPI above.  All other systems are negative.  Review of Systems  Vital Signs: BP (!) 150/58   Resp 16   SpO2 95%   Physical Exam  Constitutional: She is oriented  to person, place, and time. She appears well-developed and well-nourished. No distress.  Eyes: No scleral icterus.  Cardiovascular: Normal rate, regular rhythm and normal heart sounds.  Exam reveals no friction rub.   No murmur heard. Right chest port is accessed with a clean dressing.  Pulmonary/Chest: Effort normal and breath sounds normal. No respiratory distress. She has no wheezes.  Abdominal: Soft. Bowel sounds are normal. She exhibits no distension. There is no tenderness. There is no guarding.  Healed midline laparotomy scar. Left lower quadrant colostomy bag noted.  Neurological: She is alert and oriented to person, place, and time.  Skin: Skin is warm and  dry. No rash noted. She is not diaphoretic. No erythema.  Psychiatric: She has a normal mood and affect. Her behavior is normal.    Mallampati Score:   2  Imaging: US Abdomen Limited Ruq  Result Date: 11/17/2015 CLINICAL DATA:  Elevated alkaline phosphatase EXAM: US ABDOMEN LIMITED - RIGHT UPPER QUADRANT COMPARISON:  07/08/2015 FINDINGS: Gallbladder: Previous cholecystectomy. Common bile duct: Diameter: 9.3 Liver: No focal lesion identified. Within normal limits in parenchymal echogenicity. Trace perihepatic ascites noted. IMPRESSION: 1. Previous cholecystectomy. Mild increase caliber of the common bile duct which measures 9.3 mm. Electronically Signed   By: Kerby Moors M.D.   On: 11/17/2015 11:33    Labs:  CBC:  Recent Labs  11/09/15 0848 11/17/15 0901 11/24/15 0952 12/02/15 0816  WBC 2.2* 2.8* 2.2* 1.6*  HGB 12.3 11.3* 11.8* 11.4*  HCT 35.2 31.8* 34.1* 32.1*  PLT 178 251 177 158    COAGS:  Recent Labs  02/17/15 1516 07/02/15 1553 09/03/15 0919  INR 1.02 1.05 1.03  APTT 28 24  --     BMP:  Recent Labs  09/17/15 1435 11/06/15 1558 11/09/15 0848 11/17/15 0901  NA 134* 130* 130* 132*  K 3.9 4.9 4.5 4.4  CL 100* 97* 100* 99*  CO2 29 25 24 30   GLUCOSE 99 188* 272* 136*  BUN 16 21* 17 20    CALCIUM 8.7* 9.2 9.1 9.0  CREATININE 0.63 0.48 0.60 0.81  GFRNONAA >60 >60 >60 >60  GFRAA >60 >60 >60 >60    LIVER FUNCTION TESTS:  Recent Labs  09/17/15 1435 11/06/15 1558 11/09/15 0848 11/17/15 0901  BILITOT 0.5 0.3 0.4 0.5  AST 16 29 18  64*  ALT 13* 124* 47 69*  ALKPHOS 110 348* 230* 431*  PROT 7.6 7.5 7.1 7.1  ALBUMIN 2.8* 3.4* 3.2* 3.0*    TUMOR MARKERS: No results for input(s): AFPTM, CEA, CA199, CHROMGRNA in the last 8760 hours.  Assessment and Plan:  History of ovarian cancer, status post laparotomy. Patient has been receiving chemotherapy via a right chest port but has persistent neutropenia.  Plan for CT-guided bone marrow biopsy and aspiration today.  Risks and Benefits discussed with the patient including, but not limited to bleeding, infection, damage to adjacent structures or low yield requiring additional tests. All of the patient's questions were answered, patient is agreeable to proceed. Consent signed and in chart.    Thank you for this interesting consult.  I greatly enjoyed meeting REALITY DEJONGE and look forward to participating in their care.  A copy of this report was sent to the requesting provider on this date.  Electronically Signed: Greggory Keen 12/02/2015, 8:57 AM   I spent a total of  30 Minutes   in face to face in clinical consultation, greater than 50% of which was counseling/coordinating care for this patient with ovarian cancer and persistent neutropenia.

## 2015-12-07 DIAGNOSIS — L97429 Non-pressure chronic ulcer of left heel and midfoot with unspecified severity: Secondary | ICD-10-CM

## 2015-12-07 DIAGNOSIS — E10621 Type 1 diabetes mellitus with foot ulcer: Secondary | ICD-10-CM | POA: Insufficient documentation

## 2015-12-09 ENCOUNTER — Other Ambulatory Visit: Payer: Self-pay | Admitting: Hematology and Oncology

## 2015-12-09 ENCOUNTER — Encounter: Payer: Self-pay | Admitting: Family Medicine

## 2015-12-09 ENCOUNTER — Ambulatory Visit (INDEPENDENT_AMBULATORY_CARE_PROVIDER_SITE_OTHER): Payer: Medicare Other | Admitting: Family Medicine

## 2015-12-09 DIAGNOSIS — L8990 Pressure ulcer of unspecified site, unspecified stage: Secondary | ICD-10-CM

## 2015-12-09 DIAGNOSIS — L97421 Non-pressure chronic ulcer of left heel and midfoot limited to breakdown of skin: Secondary | ICD-10-CM | POA: Diagnosis not present

## 2015-12-09 DIAGNOSIS — J90 Pleural effusion, not elsewhere classified: Secondary | ICD-10-CM

## 2015-12-09 DIAGNOSIS — K750 Abscess of liver: Secondary | ICD-10-CM

## 2015-12-09 DIAGNOSIS — L97409 Non-pressure chronic ulcer of unspecified heel and midfoot with unspecified severity: Secondary | ICD-10-CM | POA: Insufficient documentation

## 2015-12-09 DIAGNOSIS — C569 Malignant neoplasm of unspecified ovary: Secondary | ICD-10-CM | POA: Diagnosis not present

## 2015-12-09 DIAGNOSIS — C801 Malignant (primary) neoplasm, unspecified: Secondary | ICD-10-CM

## 2015-12-09 DIAGNOSIS — I82C11 Acute embolism and thrombosis of right internal jugular vein: Secondary | ICD-10-CM

## 2015-12-09 DIAGNOSIS — C786 Secondary malignant neoplasm of retroperitoneum and peritoneum: Secondary | ICD-10-CM

## 2015-12-09 DIAGNOSIS — K839 Disease of biliary tract, unspecified: Secondary | ICD-10-CM | POA: Diagnosis not present

## 2015-12-09 NOTE — Progress Notes (Signed)
  Tommi Rumps, MD Phone: [phone]  Janet OGUINN is a 70 y.o. female who presents today for follow-up.  Ovarian cancer: Patient notes she is followed by oncology for this. Has been neutropenic and thus they are holding her chemotherapy. They are going to switch to a different treatment at some point soon. Notes some fatigue and loss of weight. Notes with her surgery for this that he take out part of her intestines and left her with an ostomy of which she states her small intestine is attached and has had relatively high output that has led to some weight loss.  Enlarged bile duct: Followed by GI for this. She's had her gallbladder removed in the past. They're planning on an MRCP next week. No abdominal pain.  Empyema: Patient has been doing well after several drainages of this. No shortness of breath. Notes last time she was evaluated for this at [facility] and noted only a small pocket of fluid and her continuing to monitor. She notes no fevers. No tightness in her chest.  Heel sore: Patient notes a left lateral heel sore. Has been well healing and getting smaller. It is about the size of a baby aspirin now. No redness or drainage or pain.  PMH: Former smoker   ROS see history of present illness  Objective  Physical Exam Vitals:   12/09/15 0839  BP: 128/64  Pulse: 77  Temp: 98.2 F (36.8 C)    BP Readings from Last 3 Encounters:  12/09/15 128/64  12/02/15 (!) 114/35  12/01/15 (!) 129/56   Wt Readings from Last 3 Encounters:  12/09/15 127 lb 6.4 oz (57.8 kg)  12/01/15 129 lb (58.5 kg)  11/17/15 127 lb 13.9 oz (58 kg)    Physical Exam  Constitutional: No distress.  HENT:  Head: Normocephalic and atraumatic.  Cardiovascular: Normal rate, regular rhythm and normal heart sounds.   Pulmonary/Chest: Effort normal and breath sounds normal.  Abdominal: Soft. Bowel sounds are normal. She exhibits no distension. There is no tenderness. There is no rebound and no guarding.    Left-sided ostomy noted with bag in place and brown stool in bag, insulin pump attached to the right lower quadrant of her abdomen  Musculoskeletal: She exhibits no edema.  Neurological: She is alert. Gait normal.  Skin: Skin is warm and dry. She is not diaphoretic.  Left lateral heel ulceration noted, nontender, no surrounding erythema, appears well healing, no drainage    Assessment/Plan: Please see individual problem list.  Ovarian cancer [facility]) Following with oncology. Currently holding treatment given that she is neutropenic. Discussed that if she ever develops fever or any signs of sickness she should seek medical attention immediately. Continue to follow with oncology.  Heel ulcer ([place]) Appears to be well healing on left heel. Discussed keeping this dry and continuing to monitor. Home health is following this. Given return precautions.  Recurrent pleural effusion on right Denies shortness of breath. Recent evaluation with CT chest revealed small amount of fluid that per review of care everywhere was not amenable to drainage. She will continue to monitor for recurrence of symptoms and if these occur she will seek medical attention.  Bile duct abnormality Patient noted to have enlarged bile duct recently. Has seen GI for this. Plan for MRCP next week. Benign abdominal exam. She will keep her appointment for MRCP.   Tommi Rumps, MD [place]

## 2015-12-09 NOTE — Patient Instructions (Signed)
Nice to meet you. Please continue to follow-up with the oncologist. Please keep your appointment for the MRI through GI. Please monitor the sore on your left heel and if it changes in any manner or develops drainage or redness please seek medical attention. If you develop trouble breathing, fevers, or begin to feel sick please seek medical attention immediately.

## 2015-12-09 NOTE — Assessment & Plan Note (Signed)
Denies shortness of breath. Recent evaluation with CT chest revealed small amount of fluid that per review of care everywhere was not amenable to drainage. She will continue to monitor for recurrence of symptoms and if these occur she will seek medical attention.

## 2015-12-09 NOTE — Assessment & Plan Note (Signed)
Patient noted to have enlarged bile duct recently. Has seen GI for this. Plan for MRCP next week. Benign abdominal exam. She will keep her appointment for MRCP.

## 2015-12-09 NOTE — Assessment & Plan Note (Signed)
Following with oncology. Currently holding treatment given that she is neutropenic. Discussed that if she ever develops fever or any signs of sickness she should seek medical attention immediately. Continue to follow with oncology.

## 2015-12-09 NOTE — Assessment & Plan Note (Signed)
Appears to be well healing on left heel. Discussed keeping this dry and continuing to monitor. Home health is following this. Given return precautions.

## 2015-12-09 NOTE — Progress Notes (Signed)
Pre visit review using our clinic review tool, if applicable. No additional management support is needed unless otherwise documented below in the visit note. 

## 2015-12-14 ENCOUNTER — Other Ambulatory Visit: Payer: Self-pay | Admitting: Gastroenterology

## 2015-12-14 ENCOUNTER — Ambulatory Visit
Admission: RE | Admit: 2015-12-14 | Discharge: 2015-12-14 | Disposition: A | Discharge status: Home / Self Care | Payer: Medicare Other | Source: Ambulatory Visit | Attending: Gastroenterology | Admitting: Gastroenterology

## 2015-12-14 ENCOUNTER — Telehealth: Payer: Self-pay | Admitting: *Deleted

## 2015-12-14 DIAGNOSIS — K838 Other specified diseases of biliary tract: Secondary | ICD-10-CM | POA: Insufficient documentation

## 2015-12-14 DIAGNOSIS — K750 Abscess of liver: Secondary | ICD-10-CM | POA: Insufficient documentation

## 2015-12-14 DIAGNOSIS — K862 Cyst of pancreas: Secondary | ICD-10-CM | POA: Diagnosis not present

## 2015-12-14 MED ORDER — GADOBENATE DIMEGLUMINE 529 MG/ML IV SOLN
15.0000 mL | Freq: Once | INTRAVENOUS | Status: AC | PRN
  Administered 2015-12-14: 11 mL via INTRAVENOUS

## 2015-12-14 NOTE — Telephone Encounter (Signed)
I called pt and made her appt for8/15 3 pm.  She is agreeable and I got Tia to cancel the appt for 8/31

## 2015-12-14 NOTE — Telephone Encounter (Signed)
Message sent ot scheduling for appt to be made

## 2015-12-14 NOTE — Telephone Encounter (Signed)
Asking for results, I see no FU appt scheduled for her to come back for results. Please advise

## 2015-12-15 ENCOUNTER — Inpatient Hospital Stay: Payer: Medicare Other

## 2015-12-15 ENCOUNTER — Telehealth: Payer: Self-pay | Admitting: *Deleted

## 2015-12-15 ENCOUNTER — Inpatient Hospital Stay: Payer: Medicare Other | Attending: Hematology and Oncology | Admitting: Hematology and Oncology

## 2015-12-15 VITALS — BP 123/68 | HR 80 | Temp 97.4°F | Resp 18 | Wt 129.3 lb

## 2015-12-15 DIAGNOSIS — I2510 Atherosclerotic heart disease of native coronary artery without angina pectoris: Secondary | ICD-10-CM | POA: Diagnosis not present

## 2015-12-15 DIAGNOSIS — Z7901 Long term (current) use of anticoagulants: Secondary | ICD-10-CM | POA: Insufficient documentation

## 2015-12-15 DIAGNOSIS — Z79899 Other long term (current) drug therapy: Secondary | ICD-10-CM | POA: Diagnosis not present

## 2015-12-15 DIAGNOSIS — E876 Hypokalemia: Secondary | ICD-10-CM

## 2015-12-15 DIAGNOSIS — C569 Malignant neoplasm of unspecified ovary: Secondary | ICD-10-CM | POA: Diagnosis present

## 2015-12-15 DIAGNOSIS — Z808 Family history of malignant neoplasm of other organs or systems: Secondary | ICD-10-CM

## 2015-12-15 DIAGNOSIS — D509 Iron deficiency anemia, unspecified: Secondary | ICD-10-CM

## 2015-12-15 DIAGNOSIS — Z862 Personal history of diseases of the blood and blood-forming organs and certain disorders involving the immune mechanism: Secondary | ICD-10-CM

## 2015-12-15 DIAGNOSIS — Z794 Long term (current) use of insulin: Secondary | ICD-10-CM | POA: Insufficient documentation

## 2015-12-15 DIAGNOSIS — Z8639 Personal history of other endocrine, nutritional and metabolic disease: Secondary | ICD-10-CM

## 2015-12-15 DIAGNOSIS — E109 Type 1 diabetes mellitus without complications: Secondary | ICD-10-CM

## 2015-12-15 DIAGNOSIS — C786 Secondary malignant neoplasm of retroperitoneum and peritoneum: Secondary | ICD-10-CM | POA: Insufficient documentation

## 2015-12-15 DIAGNOSIS — D649 Anemia, unspecified: Secondary | ICD-10-CM

## 2015-12-15 DIAGNOSIS — I82C11 Acute embolism and thrombosis of right internal jugular vein: Secondary | ICD-10-CM | POA: Insufficient documentation

## 2015-12-15 DIAGNOSIS — R188 Other ascites: Secondary | ICD-10-CM | POA: Insufficient documentation

## 2015-12-15 DIAGNOSIS — I998 Other disorder of circulatory system: Secondary | ICD-10-CM | POA: Diagnosis not present

## 2015-12-15 DIAGNOSIS — R0602 Shortness of breath: Secondary | ICD-10-CM | POA: Diagnosis not present

## 2015-12-15 DIAGNOSIS — E785 Hyperlipidemia, unspecified: Secondary | ICD-10-CM | POA: Diagnosis not present

## 2015-12-15 DIAGNOSIS — R971 Elevated cancer antigen 125 [CA 125]: Secondary | ICD-10-CM | POA: Diagnosis not present

## 2015-12-15 DIAGNOSIS — M069 Rheumatoid arthritis, unspecified: Secondary | ICD-10-CM | POA: Insufficient documentation

## 2015-12-15 DIAGNOSIS — D72819 Decreased white blood cell count, unspecified: Secondary | ICD-10-CM | POA: Insufficient documentation

## 2015-12-15 DIAGNOSIS — K222 Esophageal obstruction: Secondary | ICD-10-CM | POA: Diagnosis not present

## 2015-12-15 DIAGNOSIS — R748 Abnormal levels of other serum enzymes: Secondary | ICD-10-CM

## 2015-12-15 DIAGNOSIS — J90 Pleural effusion, not elsewhere classified: Secondary | ICD-10-CM | POA: Insufficient documentation

## 2015-12-15 DIAGNOSIS — I10 Essential (primary) hypertension: Secondary | ICD-10-CM | POA: Insufficient documentation

## 2015-12-15 DIAGNOSIS — I252 Old myocardial infarction: Secondary | ICD-10-CM

## 2015-12-15 DIAGNOSIS — K219 Gastro-esophageal reflux disease without esophagitis: Secondary | ICD-10-CM | POA: Insufficient documentation

## 2015-12-15 DIAGNOSIS — Z8701 Personal history of pneumonia (recurrent): Secondary | ICD-10-CM

## 2015-12-15 DIAGNOSIS — Z9221 Personal history of antineoplastic chemotherapy: Secondary | ICD-10-CM

## 2015-12-15 DIAGNOSIS — Z9641 Presence of insulin pump (external) (internal): Secondary | ICD-10-CM

## 2015-12-15 DIAGNOSIS — K769 Liver disease, unspecified: Secondary | ICD-10-CM | POA: Diagnosis not present

## 2015-12-15 DIAGNOSIS — Z87891 Personal history of nicotine dependence: Secondary | ICD-10-CM | POA: Diagnosis not present

## 2015-12-15 DIAGNOSIS — I5032 Chronic diastolic (congestive) heart failure: Secondary | ICD-10-CM

## 2015-12-15 LAB — CBC WITH DIFFERENTIAL/PLATELET
Basophils Absolute: 0 10*3/uL (ref 0–0.1)
Basophils Relative: 1 %
Eosinophils Absolute: 0.1 10*3/uL (ref 0–0.7)
Eosinophils Relative: 3 %
HCT: 34.7 % — ABNORMAL LOW (ref 35.0–47.0)
Hemoglobin: 12.3 g/dL (ref 12.0–16.0)
Lymphocytes Relative: 53 %
Lymphs Abs: 1 10*3/uL (ref 1.0–3.6)
MCH: 29.7 pg (ref 26.0–34.0)
MCHC: 35.4 g/dL (ref 32.0–36.0)
MCV: 83.8 fL (ref 80.0–100.0)
Monocytes Absolute: 0.4 10*3/uL (ref 0.2–0.9)
Monocytes Relative: 21 %
Neutro Abs: 0.4 10*3/uL — ABNORMAL LOW (ref 1.4–6.5)
Neutrophils Relative %: 22 %
Platelets: 200 10*3/uL (ref 150–440)
RBC: 4.14 MIL/uL (ref 3.80–5.20)
RDW: 15.8 % — ABNORMAL HIGH (ref 11.5–14.5)
WBC: 1.9 10*3/uL — ABNORMAL LOW (ref 3.6–11.0)

## 2015-12-15 LAB — BASIC METABOLIC PANEL
Anion gap: 7 (ref 5–15)
BUN: 21 mg/dL — ABNORMAL HIGH (ref 6–20)
CO2: 27 mmol/L (ref 22–32)
Calcium: 9.7 mg/dL (ref 8.9–10.3)
Chloride: 102 mmol/L (ref 101–111)
Creatinine, Ser: 0.46 mg/dL (ref 0.44–1.00)
GFR calc Af Amer: >60 mL/min (ref >60)
GFR calc non Af Amer: >60 mL/min (ref >60)
Glucose, Bld: 86 mg/dL (ref 65–99)
Potassium: 4.5 mmol/L (ref 3.5–5.1)
Sodium: 136 mmol/L (ref 135–145)

## 2015-12-15 NOTE — Telephone Encounter (Signed)
Called pt to let her know that anc .4  She should follow neutropenic precautions and call if she had temp over 100.4 or greater and she has been using precautions already. Dr. Jefm Bryant  And corcoran spoke on telephone this evening and he will call pt with an appt to start her on med for RA and hopefully the labs will get better.  She had the same thing in 2012 and got meds that helped and he will look into that again. Pt agreeable to plan.  She doe want to know if she should come over next week and I told her  I would check with md and let her know on Thursday.

## 2015-12-15 NOTE — Progress Notes (Signed)
Patient is here for follow up. Recently had a bone marrow biopsy.

## 2015-12-15 NOTE — Progress Notes (Addendum)
[facility] day:  12/15/2015   Chief Complaint: [name] is a 70 y.o. 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  HPI:  The patient was last seen in the medical oncology clinic on 11/17/2015.  At that time, chemotherapy was postponed secondary to persistent low counts. Alkaline phosphatase was increasing.  We discussed bone marrow aspirate and biopsy.  We discussed referral to Dr. Allen Norris of gastroenterology.  Bone marrow aspirate and biopsy on 12/03/2015 revealed decreased myeloid cells (37%) with left shifted maturation and 1% atypical myelod blasts.  There was relatively increased monocytic cells (11%), relatively increased lymphoid cells (36%), and relatively increased eosinophils (6%).  Cytogenetics were normal (53, XX).  MRI of the abdomen on 12/14/2015 revealed mild extrahepatic biliary duct dilatation without obstructing lesion identified. Dilatation extends to the level of the ampulla without obstructing lesion identified. Imaging could not exclude small ampullary lesion or distal stricture.  The pancreas was normal without duct dilatation.  There was a small cystic lesion along the dorsal surface of the pancreas may communicate with an accessory pancreatic duct (differential includes postinflammatory cystic change versus side branch intraductal papillary mucinous tumor). Recommendation was for follow-up MRI in 12 months.  There was resolution of the hepatic abscess.  She notes that her diabetes is in good control of.  Her arthritis is not good. She notes problems with her bone marrow in the past caused by her rheumatoid arthritis which were managed by Dr. Jefm Bryant.  She denies any fevers or infections.   Past Medical History:  Diagnosis Date  . CAD (coronary artery disease)    a.  09/2012 Cath: LM nl, LAD 95p, 28m LCX 935mOM2 50, RCA 100.  . Marland Kitchenholelithiasis   . Chronic diastolic CHF (congestive heart failure) ([facility] [place]  . Collagen vascular disease ([facility]  . Esophageal stricture   . Exertional shortness of breath   . GERD (gastroesophageal reflux disease)   . H/O hiatal hernia   . Herniated disc   . History of pancytopenia   . Hyperlipidemia   . Hypertension   . Hypokalemia   . Leukopenia 2012   s/p bone marrow biopsy, Dr. PaMa Hillock. NSTEMI (non-ST elevated myocardial infarction) ([facility]   "mild" (10/18/2012)  . Ovarian cancer ([facility]  . Pneumonia 2013; 08/2012   "one lung; double" (10/18/2012)  . Rheumatoid arthritis(714.0)   . Type I diabetes mellitus ([facility]   "dx'd in 1957" (10/18/2012)    Past Surgical History:  Procedure Laterality Date  . ABDOMINAL HYSTERECTOMY  06/15/2015   Dx L/S, EXLAP TAH BSO omentectomy RSRx colostomy diaphragm resection stripping  . CARDIAC CATHETERIZATION  10/18/2012   "first one was today" (10/18/2012)  . CATARACT EXTRACTION W/ INTRAOCULAR LENS IMPLANT Right 2010  . CHOLECYSTECTOMY  06/15/2015   combined case with ovarian cancer debulking  . COLON SURGERY    . CORONARY ARTERY BYPASS GRAFT N/A 10/19/2012   Procedure: CORONARY ARTERY BYPASS GRAFTING (CABG);  Surgeon: StMelrose NakayamaMD;  [facility] Service: Open [facility] Surgery;  Laterality: N/A;  . ESOPHAGEAL DILATION     "3 or 4 times" (10/19/2011)  . ESOPHAGOGASTRODUODENOSCOPY  2012   Dr. IfMinna Merritts. OSTOMY    . OVARY SURGERY     removal  . PERIPHERAL VASCULAR CATHETERIZATION N/A  03/02/2015   Procedure: Glori Luis Cath Insertion;  Surgeon: Algernon Huxley, MD;  Location: [facility];  Service: Cardiovascular;  Laterality: N/A;  . TUBAL LIGATION  1970    Family History  Problem Relation Age of Onset  . Diabetes Mother   . Arthritis Mother   . Diabetes Father   . Arthritis Father   . Bone cancer Sister     Social History:  reports that she quit smoking  about 21 years ago. Her smoking use included Cigarettes. She has a 30.00 pack-year smoking history. She has never used smokeless tobacco. She reports that she does not drink alcohol or use drugs.  The patient is accompanied by her husband today.  Allergies:  Allergies  Allergen Reactions  . Codeine Nausea And Vomiting  . Latex Rash    Current Medications: Current Outpatient Prescriptions  Medication Sig Dispense Refill  . Calcium-Vitamin D 600-200 MG-UNIT tablet Take 2 tablets by mouth daily.     Marland Kitchen enoxaparin (LOVENOX) 60 MG/0.6ML injection Inject 60 mg into the skin. Held last pm and this am dose for bone marrow biopsy    . folic acid (FOLVITE) 1 MG tablet TAKE 1 TABLET BY MOUTH ONCE A DAY 90 tablet 3  . furosemide (LASIX) 80 MG tablet Take 80 mg by mouth daily as needed.     . Insulin Human (INSULIN PUMP) SOLN Pt uses Humalog insulin.    Marland Kitchen insulin lispro (HUMALOG) 100 UNIT/ML injection Inject 0.06 mLs (6 Units total) into the skin daily. 30 mL 3  . lidocaine-prilocaine (EMLA) cream Apply 1 application topically as needed (prior to accessing port).    . loratadine (CLARITIN) 10 MG tablet Take 1 tablet by mouth daily as needed.    Marland Kitchen losartan (COZAAR) 100 MG tablet TAKE 1 TABLET BY MOUTH ONCE A DAY 90 tablet 1  . metoprolol tartrate (LOPRESSOR) 25 MG tablet TAKE 1 TABLET BY MOUTH TWICE A DAY AS NEEDED 180 tablet 3  . ondansetron (ZOFRAN) 8 MG tablet Take 1 tablet (8 mg total) by mouth every 8 (eight) hours as needed for nausea or vomiting. 20 tablet 1  . ONE TOUCH ULTRA TEST test strip     . oxyCODONE (OXY IR/ROXICODONE) 5 MG immediate release tablet Take 5 mg by mouth every 6 (six) hours as needed. Reported on 11/17/2015    . pantoprazole (PROTONIX) 40 MG tablet TAKE 1 TABLET BY MOUTH TWICE A DAY 180 tablet 1  . potassium chloride SA (K-DUR,KLOR-CON) 20 MEQ tablet Take 2 tablets (40 mEq total) by mouth daily. 60 tablet 3  . predniSONE (DELTASONE) 5 MG tablet Take 5 mg by mouth as needed.  Reported on 09/17/2015    . ranitidine (ZANTAC) 150 MG tablet Take 1 tablet (150 mg total) by mouth 2 (two) times daily. (Patient taking differently: Take 150 mg by mouth 2 (two) times daily as needed. ) 180 tablet 2  . methotrexate (RHEUMATREX) 2.5 MG tablet Take by mouth.    . vitamin C (ASCORBIC ACID) 500 MG tablet Take 500 mg by mouth daily.    Marland Kitchen zinc gluconate 50 MG tablet Take 50 mg by mouth daily.     No current facility-administered medications for this visit.    Facility-Administered Medications Ordered in Other Visits  Medication Dose Route Frequency Provider Last Rate Last Dose  . sodium chloride 0.9 % injection 10 mL  10 mL Intracatheter PRN Lequita Asal, MD      . sodium chloride 0.9 % injection  10 mL  10 mL Intravenous PRN Lequita Asal, MD   10 mL at 04/13/15 1540    Review of Systems:  GENERAL:  Feels "ok".  No fever, chills or sweats.  Weight up 2 pounds since last visit. PERFORMANCE STATUS (ECOG):  2 HEENT:  No visual changes, sore throat, mouth sores or tenderness. Lungs:  No shortness of breath or cough.  No hemoptysis. Cardiac:  No chest pain, palpitations, orthopnea, or PND. GI:  Appetite good. No abdominal pain.  No constipation, diarrhea, melena or hematochezia. GU:  No urgency, frequency, dysuria, or hematuria. Musculoskeletal:  Severe rheumatoid arthritis.  Osteoporosis.  No back pain. No muscle tenderness. Extremities:  No pain or swelling. Skin:  No rashes or skin changes. Neuro:  Neuropathy (stable).  No headache, numbness or weakness, balance or coordination issues. Endocrine:  Diabetes on an insulin pump (blood sugar good).  No thyroid issues, hot flashes or night sweats. Psych:  No mood changes, depression or anxiety. Pain: No pain. Review of systems:  All other systems reviewed and found to be negative.  Physical Exam: Blood pressure 123/68, pulse 80, temperature 97.4 F (36.3 C), temperature source Tympanic, resp. rate 18, weight 129 lb 4.8  oz (58.7 kg). GENERAL:  Thin woman sitting comfortably in the exam room in no acute distress.  Ambulating with a cane. MENTAL STATUS:  Alert and oriented to person, place and time. HEAD:  Short gray hair.  Normocephalic, atraumatic, face symmetric, no Cushingoid features. EYES:  Gold rimmed glasses.  Blue eyes.  No conjunctivitis or scleral icterus.  ENT: Oropharynx clear without lesion. Tongue normal. Mucous membranes moist.  RESPIRATORY: Clear to auscultation without rales, wheezes or rhonchi. CARDIOVASCULAR: Regular rate and rhythm without murmur, rub or gallop. ABDOMEN: Soft, non-tender with active bowel sounds and no appreciable hepatosplenomegaly. No palpable nodularity or masses. SKIN: No rashes. EXTREMITIES:  No edema, skin discoloration or tenderness. No palpable cords. LYMPH NODES: No palpable cervical, supraclavicular, axillary or inguinal adenopathy  NEUROLOGICAL: Appropriate. PSYCH: Appropriate.   Appointment on 12/15/2015  Component Date Value Ref Range Status  . Sodium 12/15/2015 136  135 - 145 mmol/L Final  . Potassium 12/15/2015 4.5  3.5 - 5.1 mmol/L Final  . Chloride 12/15/2015 102  101 - 111 mmol/L Final  . CO2 12/15/2015 27  22 - 32 mmol/L Final  . Glucose, Bld 12/15/2015 86  65 - 99 mg/dL Final  . BUN 12/15/2015 21* 6 - 20 mg/dL Final  . Creatinine, Ser 12/15/2015 0.46  0.44 - 1.00 mg/dL Final  . Calcium 12/15/2015 9.7  8.9 - 10.3 mg/dL Final  . GFR calc non Af Amer 12/15/2015 >60  >60 mL/min Final  . GFR calc Af Amer 12/15/2015 >60  >60 mL/min Final   Comment: (NOTE) The eGFR has been calculated using the CKD EPI equation. This calculation has not been validated in all clinical situations. eGFR's persistently <60 mL/min signify possible Chronic Kidney Disease.   . Anion gap 12/15/2015 7  5 - 15 Final  . WBC 12/15/2015 1.9* 3.6 - 11.0 K/uL Final  . RBC 12/15/2015 4.14  3.80 - 5.20 MIL/uL Final  . Hemoglobin 12/15/2015 12.3  12.0 - 16.0 g/dL Final   . HCT 12/15/2015 34.7* 35.0 - 47.0 % Final  . MCV 12/15/2015 83.8  80.0 - 100.0 fL Final  . MCH 12/15/2015 29.7  26.0 - 34.0 pg Final  . MCHC 12/15/2015 35.4  32.0 - 36.0 g/dL Final  . RDW 12/15/2015 15.8* 11.5 - 14.5 %  Final  . Platelets 12/15/2015 200  150 - 440 K/uL Final  . Neutrophils Relative % 12/15/2015 22%  % Final  . Neutro Abs 12/15/2015 0.4* 1.4 - 6.5 K/uL Final   Comment: RESULT REPEATED AND VERIFIED CRITICAL RESULT CALLED TO, READ BACK BY AND VERIFIED WITH: SHERRY VENABELE AT 1630 12/15/2015 KMR   . Lymphocytes Relative 12/15/2015 53%  % Final  . Lymphs Abs 12/15/2015 1.0  1.0 - 3.6 K/uL Final  . Monocytes Relative 12/15/2015 21%  % Final  . Monocytes Absolute 12/15/2015 0.4  0.2 - 0.9 K/uL Final  . Eosinophils Relative 12/15/2015 3%  % Final  . Eosinophils Absolute 12/15/2015 0.1  0 - 0.7 K/uL Final  . Basophils Relative 12/15/2015 1%  % Final  . Basophils Absolute 12/15/2015 0.0  0 - 0.1 K/uL Final    Assessment:  GHINA BITTINGER is a 70 y.o. female with clinical stage IIIC (T3cN1Mx) ovarian cancer presenting with abdominal discomfort and bloating.  Omental biopsy on 02/23/2015 revealed metastatic high grade serous carcinoma, consistent with gynecologic origin.  CA125 was 707 on 02/17/2015.  Abdomen and pelvic CT scan on 02/13/2015 revealed bilateral mass-like adnexal regions (right adnexal mass 5.6 x 5.0 cm and the left adnexa mass 10.3 x 6.0 x 9.9 cm).  There was a large amount of soft tissue throughout the peritoneal cavity involving the omentum and other peritoneal surfaces.  There was a small volume ascites. There was a peripheral 3.3 x 1.9 cm low-attenuation lesion overlying the right lobe of the liver , likely representing a serosal implant. There was 1.4 x 2.2 cm ill-defined peripheral lesion within the inferior aspect of segment 6 adjacent to the ampulla in the duodenum.   She has severe rheumatoid arthritis.  Methotrexate and Enbrel are on hold.  She has a  normocytic anemia.  Work-up on 02/17/2015 revealed a normal ferritin, B12, folate, TSH.  She denies any melena or hematochezia.  She has diabetes and is on an insulin pump.  She is status post cycle #4 neoadjuvant carboplatin and Taxol (03/05/2015 - 05/22/2015).  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  She had loose stools on oral magnesium.  She was initially on Neurontin then switched Lyrica with cycle #3.  Cycle #4 was notable for neutropenia (ANC 300) requiring GCSF x 3 days.    CA125 was 802.9 on 03/30/2015, 567.9 on 04/13/2015, 168.8 on 05/15/2015, 85.2 on 07/17/2015, 68.6 on 07/28/2015, 34.5 on 09/17/2015, and 20.7 on 11/06/2015.  Abdominal and pelvic CT scan on 04/28/2015 revealed decreasing bilateral ovarian masses.   The left adnexal mass measured 4.0 x 6.1 cm (previously 5.0 x 8.5 cm). The right ovary measured 3.8 x 4.9 cm (previously 4.7 x 5.6 cm).  There was improved peritoneal carcinomatosis.  There was a small amount of ascites.  The hepatic dome lesion was stable. The previously seen right hepatic lobe lesion was not well-visualized.  There was a small left pleural effusion and trace right pleural effusion.  The nodular lesion at the ampulla of Vater, extending into the duodenum was stable.  There was no biliary ductal dilatation.  She underwent exploratory laparotomy, lysis of adhesions, total abdominal hysterectomy with bilateral salpingo-oophorectomy, infracolic omentectomy, optimal tumor debulking(< 1 cm), recto-sigmoid resection with creation of end colostomy, cholecystectomy, mobilization of splenic flexure and liver with diaphragmatic stripping on 06/15/2015. The right diaphragm was cleared of tumor. During dissection, the diaphragm was entered and closed with sutures.   She has had a recurrent right sided pleural  effusion.  She underwent thoracentesis of 650 cc on post-operative day 3.  She was admitted to [facility] on 07/01/2015 and 07/07/2015 for recurrent shortness of  breath.  She has undergone 2 additional thoracentesis (1.1 L on 07/02/2015 and 850 cc on 07/08/2015).  Cytology was negative x 2.  Bilateral lower extremity duplex on 07/03/2015 was negative.  Echo on 07/08/2015 revealed en EF of 55-60%.    She has anemia likely due to chronic disease. She received 1 unit PRBCs during her recent admission to [facility]. She denies any melena or hematochezia. Diet is modest, but improving.  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 She is on Lovenox 60 mg twice a day.   She was admitted to [facility] from 07/29/2015 - 08/10/2015 with a right-sided empyema and liver abscess. She underwent CT-guided placement of a liver abscess drain on 07/30/2015. Liver abscess culture grew out group B strep and Enterobacter which was sensitive to Zosyn. She was transitioned to ertapenem Colbert Ewing) prior to discharge.  She was readmitted to [facility] on 09/17/2015.  Chest, abdomen, and pelvic CT scan on 09/01/2015 revealed continued decrease in perihepatic fluid collection contiguous with the right pleural space with percutaneous drain. Drain was removed on 09/01/2015.  Chest, abdomen, and pelvic CT scan on 09/11/2015 revealed a residual versus recurrent fluid collection posterior to the right hepatic lobe (2.6 x 1.1 x 4.0 cm).  Chest, abdomen, and pelvic CT scan on 10/13/2015 revealed resolution of the empyema.    Labs on 11/06/2015 revealed a WBC 2100 with an Cobb of 1000.  Folate and MMA were normal.  TSH was 6.13 (high) with a free T4 of 1.17 (0.61-1.12).    Bone marrow aspirate and biopsy on 06/09/2010 revealed a hypercellular marrow (70%) with no evidence of dysplasia or malignancy.  Flow cytometry was negative.  Cytogenetics were normal (46,XX).  FISH studies were negative for MDS.  Bone marrow aspirate and biopsy on 12/03/2015 revealed a  normocellular to mildy hypercellular marrow for age (40%) with left shifted myelopoiesis, non specific dyserythropoiesis and mild megakaryocytic atypia with no increase in blasts.  There were multiple small nonspecific lymphoid aggregates (favor reactive). There was no increase in reticulin.  There was decreased myeloid cells (37%) with left shifted maturation and 1% atypical myelod blasts.  There was relatively increased monocytic cells (11%), relatively increased lymphoid cells (36%), and relatively increased eosinophils (6%).  Cytogenetics were normal (81, XX).  SNP microarray was normal.  Alkaline phosphatase was 348 (38-126) on 11/06/2015 and 431 on 11/17/2015.   Fractionated alkaline phosphatase on 11/09/2015 revealed 21% bone and 79% liver.  MRI of the abdomen on 12/14/2015 revealed mild extrahepatic biliary duct dilatation without obstructing lesion identified.  Symptomatically, she has a grade III neuropathy .  She has persistent leukopenia (ANC 400).  She has mild anemia.  Plan: 1.  Labs today:  CBC with diff, CMP, Mg. 2.  Discuss interval abdominal MRI and follow-up with Dr. Allen Norris. 3.  Review bone marrow aspirate and biopsy.  Discuss persistent low counts and inability to proceed with chemotherapy. 4.  Discuss phone follow-up with Dr. Jefm Bryant re: treatment of rheumatoid arthritis. 5.  Continue Lovenox for right IJ thrombosis. 6.  RTC in 1 week for MD assessment.   Lequita Asal, MD  12/15/2015

## 2015-12-17 ENCOUNTER — Telehealth: Payer: Self-pay | Admitting: *Deleted

## 2015-12-17 NOTE — Telephone Encounter (Signed)
-----   Message from Lequita Asal, MD sent at 12/16/2015  9:21 AM EDT -----  We will postpone that appt. Follow-up with [place] per Dr. Jefm Bryant.  M  ----- Message ----- From: Luella Cook, RN Sent: 12/15/2015   5:58 PM To: Lequita Asal, MD  She wants to know if you still want her to rtn in 1 week since she is going to get appt with Dr. Jefm Bryant

## 2015-12-17 NOTE — Telephone Encounter (Signed)
Pt got appt in the mail fo r8/31 and then was called with 8/28 but times did not match.  I told her that per corcoran since she was getting treatment with Precious Reel and he would drive how often she needs to be seen by [place].  I tol dher I would check with [place] and call her back . I called and spoke to [facility] md nurse and she will call me back tom. She is going to come back to [facility] in 2 weeks with cbc, liver enzymes

## 2015-12-21 ENCOUNTER — Inpatient Hospital Stay: Payer: Medicare Other | Admitting: Hematology and Oncology

## 2015-12-21 ENCOUNTER — Ambulatory Visit: Payer: Medicare Other | Admitting: Hematology and Oncology

## 2015-12-25 ENCOUNTER — Telehealth: Payer: Self-pay | Admitting: *Deleted

## 2015-12-25 DIAGNOSIS — D72819 Decreased white blood cell count, unspecified: Secondary | ICD-10-CM

## 2015-12-25 NOTE — Telephone Encounter (Signed)
Called pt and told her that we got message from Dr. Jefm Bryant office she is having labs 8/31 and see ing pt 9/20.  Dr. Mike Gip would like to see her on 9/22 with cbc to see the counts and make sure the regimen that [place] has her on is working. She is agreeable 11:15 labs and see md 11/:30.

## 2015-12-28 ENCOUNTER — Ambulatory Visit: Payer: Medicare Other | Admitting: Hematology and Oncology

## 2015-12-31 ENCOUNTER — Ambulatory Visit
Admission: RE | Admit: 2015-12-31 | Discharge: 2015-12-31 | Disposition: A | Discharge status: Home / Self Care | Payer: Medicare Other | Source: Ambulatory Visit | Attending: Surgery | Admitting: Surgery

## 2015-12-31 ENCOUNTER — Encounter: Payer: Medicare Other | Attending: Surgery | Admitting: Surgery

## 2015-12-31 ENCOUNTER — Telehealth: Payer: Self-pay

## 2015-12-31 ENCOUNTER — Ambulatory Visit: Payer: Medicare Other | Admitting: Hematology and Oncology

## 2015-12-31 ENCOUNTER — Other Ambulatory Visit: Payer: Self-pay | Admitting: Surgery

## 2015-12-31 DIAGNOSIS — S81809A Unspecified open wound, unspecified lower leg, initial encounter: Secondary | ICD-10-CM

## 2015-12-31 DIAGNOSIS — Z79899 Other long term (current) drug therapy: Secondary | ICD-10-CM | POA: Diagnosis not present

## 2015-12-31 DIAGNOSIS — Z87891 Personal history of nicotine dependence: Secondary | ICD-10-CM | POA: Diagnosis not present

## 2015-12-31 DIAGNOSIS — Z794 Long term (current) use of insulin: Secondary | ICD-10-CM | POA: Insufficient documentation

## 2015-12-31 DIAGNOSIS — I2510 Atherosclerotic heart disease of native coronary artery without angina pectoris: Secondary | ICD-10-CM | POA: Insufficient documentation

## 2015-12-31 DIAGNOSIS — M069 Rheumatoid arthritis, unspecified: Secondary | ICD-10-CM | POA: Insufficient documentation

## 2015-12-31 DIAGNOSIS — S91302A Unspecified open wound, left foot, initial encounter: Secondary | ICD-10-CM | POA: Insufficient documentation

## 2015-12-31 DIAGNOSIS — X58XXXA Exposure to other specified factors, initial encounter: Secondary | ICD-10-CM | POA: Diagnosis not present

## 2015-12-31 DIAGNOSIS — Z7952 Long term (current) use of systemic steroids: Secondary | ICD-10-CM | POA: Diagnosis not present

## 2015-12-31 DIAGNOSIS — E119 Type 2 diabetes mellitus without complications: Secondary | ICD-10-CM | POA: Diagnosis not present

## 2015-12-31 DIAGNOSIS — C569 Malignant neoplasm of unspecified ovary: Secondary | ICD-10-CM | POA: Insufficient documentation

## 2015-12-31 DIAGNOSIS — E1040 Type 1 diabetes mellitus with diabetic neuropathy, unspecified: Secondary | ICD-10-CM | POA: Diagnosis not present

## 2015-12-31 DIAGNOSIS — L89623 Pressure ulcer of left heel, stage 3: Secondary | ICD-10-CM | POA: Diagnosis not present

## 2015-12-31 DIAGNOSIS — Z9221 Personal history of antineoplastic chemotherapy: Secondary | ICD-10-CM | POA: Insufficient documentation

## 2015-12-31 DIAGNOSIS — I110 Hypertensive heart disease with heart failure: Secondary | ICD-10-CM | POA: Diagnosis not present

## 2015-12-31 DIAGNOSIS — E10621 Type 1 diabetes mellitus with foot ulcer: Secondary | ICD-10-CM | POA: Insufficient documentation

## 2015-12-31 DIAGNOSIS — I5032 Chronic diastolic (congestive) heart failure: Secondary | ICD-10-CM | POA: Diagnosis not present

## 2015-12-31 DIAGNOSIS — C787 Secondary malignant neoplasm of liver and intrahepatic bile duct: Secondary | ICD-10-CM | POA: Insufficient documentation

## 2015-12-31 NOTE — Telephone Encounter (Signed)
Pt returned call and appt was scheduled.  

## 2015-12-31 NOTE — Telephone Encounter (Signed)
-----   Message from Lucilla Lame, MD sent at 12/23/2015  7:51 AM EDT ----- Please have the patient come in for a follow up.

## 2015-12-31 NOTE — Telephone Encounter (Signed)
Left vm for pt to return my call to schedule a follow up MRI result appt.

## 2016-01-01 NOTE — Progress Notes (Signed)
[name], [name] ([id number]) Visit Report for 12/31/2015 Abuse/Suicide Risk Screen Details Patient Name: [name], [name] Date of Service: 12/31/2015 8:00 AM Medical Record Number: [id number] Patient Account Number: [id number] Date of Birth/Sex: [birth year] (70 y.o. Female) Treating RN: Montey Hora Primary Care Physician: Caryl Bis, ERIC Other Clinician: Referring Physician: Caryl Bis, ERIC Treating Physician/Extender: Frann Rider in Treatment: 0 Abuse/Suicide Risk Screen Items Answer ABUSE/SUICIDE RISK SCREEN: Has anyone close to you tried to hurt or harm you recentlyo No Do you feel uncomfortable with anyone in your familyo No Has anyone forced you do things that you didnot want to doo No Do you have any thoughts of harming yourselfo No Patient displays signs or symptoms of abuse and/or neglect. No Electronic Signature(s) Signed: 12/31/2015 4:52:33 PM By: Montey Hora Entered By: Montey Hora on 12/31/2015 08:13:21 [name] ([id number]) -------------------------------------------------------------------------------- Activities of Daily Living Details Patient Name: [name] Date of Service: 12/31/2015 8:00 AM Medical Record Number: [id number] Patient Account Number: [id number] Date of Birth/Sex: 1946-02-16 (70 y.o. Female) Treating RN: Montey Hora Primary Care Physician: Caryl Bis, ERIC Other Clinician: Referring Physician: Caryl Bis, ERIC Treating Physician/Extender: Frann Rider in Treatment: 0 Activities of Daily Living Items Answer Activities of Daily Living (Please select one for each item) Drive Automobile Not Able Take Medications Completely Able Use Telephone Completely Able Care for Appearance Completely Able Use Toilet Completely Able Bath / Shower Need Assistance Dress Self Completely Able Feed Self Completely Able Walk Completely Able Get In / Out Bed Completely Able Housework Completely Able Prepare Meals  Completely [place] for Self Completely Able Electronic Signature(s) Signed: 12/31/2015 4:52:33 PM By: Montey Hora Entered By: Montey Hora on 12/31/2015 08:13:49 [name] ([id number]) -------------------------------------------------------------------------------- Education Assessment Details Patient Name: [name] Date of Service: 12/31/2015 8:00 AM Medical Record Number: [id number] Patient Account Number: [id number] Date of Birth/Sex: 12-26-1945 (70 y.o. Female) Treating RN: Montey Hora Primary Care Physician: Caryl Bis, ERIC Other Clinician: Referring Physician: Caryl Bis, ERIC Treating Physician/Extender: Frann Rider in Treatment: 0 Primary Learner Assessed: Caregiver spouse Reason Patient is not Primary Learner: wound ocation Learning Preferences/Education Level/Primary Language Learning Preference: Explanation, Demonstration Highest Education Level: College or Above Preferred Language: English Cognitive Barrier Assessment/Beliefs Language Barrier: No Translator Needed: No Memory Deficit: No Emotional Barrier: No Cultural/Religious Beliefs Affecting Medical No Care: Physical Barrier Assessment Impaired Vision: No Impaired Hearing: No Decreased Hand dexterity: No Knowledge/Comprehension Assessment Knowledge Level: Medium Comprehension Level: Medium Ability to understand written Medium instructions: Ability to understand verbal Medium instructions: Motivation Assessment Anxiety Level: Calm Cooperation: Cooperative Education Importance: Acknowledges Need Interest in Health Problems: Asks Questions Perception: Coherent Willingness to Engage in Self- Medium Management Activities: Readiness to Engage in Self- Medium Management Activities: [name], [name] ([id number]) Electronic Signature(s) Signed: 12/31/2015 4:52:33 PM By: Montey Hora Entered By: Montey Hora on 12/31/2015 08:14:17 [name], [name] ([id number]) -------------------------------------------------------------------------------- Fall Risk Assessment Details Patient Name: [name] Date of Service: 12/31/2015 8:00 AM Medical Record Number: [id number] Patient Account Number: [id number] Date of Birth/Sex: [birth year] (70 y.o. Female) Treating RN: Montey Hora Primary Care Physician: Caryl Bis, ERIC Other Clinician: Referring Physician: Caryl Bis, ERIC Treating Physician/Extender: Frann Rider in Treatment: 0 Fall Risk Assessment Items Have you had 2 or more falls in the last 12 monthso 0 No Have you had any fall that resulted in injury in the last 12 monthso 0 No FALL RISK ASSESSMENT: History of falling - immediate or within 3 months 0 No Secondary diagnosis 0 No Ambulatory  aid None/bed rest/wheelchair/nurse 0 Yes Crutches/cane/walker 0 No Furniture 0 No IV Access/Saline Lock 0 No Gait/Training Normal/bed rest/immobile 0 No Weak 10 Yes Impaired 0 No Mental Status Oriented to own ability 0 Yes Electronic Signature(s) Signed: 12/31/2015 4:52:33 PM By: Montey Hora Entered By: Montey Hora on 12/31/2015 08:14:31 [name] ([id number]) -------------------------------------------------------------------------------- Foot Assessment Details Patient Name: [name] Date of Service: 12/31/2015 8:00 AM Medical Record Number: [id number] Patient Account Number: [id number] Date of Birth/Sex: November 16, 1945 (70 y.o. Female) Treating RN: Montey Hora Primary Care Physician: Caryl Bis, ERIC Other Clinician: Referring Physician: Caryl Bis, ERIC Treating Physician/Extender: Frann Rider in Treatment: 0 Foot Assessment Items Site Locations + = Sensation present, - = Sensation absent, C = Callus, U = Ulcer R = Redness, W = Warmth, M = Maceration, PU = Pre-ulcerative lesion F = Fissure, S = Swelling, D = Dryness Assessment Right: Left: Other Deformity: No No Prior Foot  Ulcer: No No Prior Amputation: No No Charcot Joint: No No Ambulatory Status: Ambulatory Without Help Gait: Steady Electronic Signature(s) Signed: 12/31/2015 4:52:33 PM By: Montey Hora Entered By: Montey Hora on 12/31/2015 08:14:58 [name] ([id number]) -------------------------------------------------------------------------------- Nutrition Risk Assessment Details Patient Name: [name] Date of Service: 12/31/2015 8:00 AM Medical Record Number: [id number] Patient Account Number: [id number] Date of Birth/Sex: [birth year] (70 y.o. Female) Treating RN: Montey Hora Primary Care Physician: Caryl Bis, ERIC Other Clinician: Referring Physician: Caryl Bis, ERIC Treating Physician/Extender: Frann Rider in Treatment: 0 Height (in): 63 Weight (lbs): 130 Body Mass Index (BMI): 23 Nutrition Risk Assessment Items NUTRITION RISK SCREEN: I have an illness or condition that made me change the kind and/or 0 No amount of food I eat I eat fewer than two meals per day 0 No I eat few fruits and vegetables, or milk products 0 No I have three or more drinks of beer, liquor or wine almost every day 0 No I have tooth or mouth problems that make it hard for me to eat 0 No I don't always have enough money to buy the food I need 0 No I eat alone most of the time 0 No I take three or more different prescribed or over-the-counter drugs a 1 Yes day Without wanting to, I have lost or gained 10 pounds in the last six 0 No months I am not always physically able to shop, cook and/or feed myself 0 No Nutrition Protocols Good Risk Protocol 0 No interventions needed Moderate Risk Protocol Electronic Signature(s) Signed: 12/31/2015 4:52:33 PM By: Montey Hora Entered By: Montey Hora on 12/31/2015 08:14:42

## 2016-01-01 NOTE — Progress Notes (Signed)
TIFFANIAMBER, [name] ([id number]) Visit Report for 12/31/2015 Allergy List Details Patient Name: [name], [name] Date of Service: 12/31/2015 8:00 AM Medical Record Number: [id number] Patient Account Number: [id number] Date of Birth/Sex: 04-07-1946 (70 y.o. Female) Treating RN: Montey Hora Primary Care Physician: Caryl Bis, ERIC Other Clinician: Referring Physician: Caryl Bis, ERIC Treating Physician/Extender: Frann Rider in Treatment: 0 Allergies Active Allergies codeine Reaction: nausea latex Allergy Notes Electronic Signature(s) Signed: 12/31/2015 4:52:33 PM By: Montey Hora Entered By: Montey Hora on 12/31/2015 08:11:39 [name] ([id number]) -------------------------------------------------------------------------------- Arrival Information Details Patient Name: [name] Date of Service: 12/31/2015 8:00 AM Medical Record Number: [id number] Patient Account Number: [id number] Date of Birth/Sex: 1945-11-25 (70 y.o. Female) Treating RN: Montey Hora Primary Care Physician: Caryl Bis, ERIC Other Clinician: Referring Physician: Caryl Bis, ERIC Treating Physician/Extender: Frann Rider in Treatment: 0 Visit Information Patient Arrived: Ambulatory Arrival Time: 08:05 Accompanied By: spouse Transfer Assistance: None Patient Identification Verified: Yes Secondary Verification Process Yes Completed: Patient Has Alerts: Yes Patient Alerts: Patient on Blood Thinner lovenox BID DMII History Since Last Visit Added or deleted any medications: No Any new allergies or adverse reactions: No Had a fall or experienced change in activities of daily living that may affect risk of falls: No Signs or symptoms of abuse/neglect since last visito No Hospitalized since last visit: No Electronic Signature(s) Signed: 12/31/2015 4:52:33 PM By: Montey Hora Entered By: Montey Hora on 12/31/2015 08:10:46 [name]  ([id number]) -------------------------------------------------------------------------------- Clinic Level of Care Assessment Details Patient Name: [name] Date of Service: 12/31/2015 8:00 AM Medical Record Number: [id number] Patient Account Number: [id number] Date of Birth/Sex: Oct 18, 1945 (70 y.o. Female) Treating RN: Afful, RN, BSN, Velva Harman Primary Care Physician: Caryl Bis, ERIC Other Clinician: Referring Physician: Caryl Bis, ERIC Treating Physician/Extender: Frann Rider in Treatment: 0 Clinic Level of Care Assessment Items TOOL 1 Quantity Score []  - Use when EandM and Procedure is performed on INITIAL visit 0 ASSESSMENTS - Nursing Assessment / Reassessment X - General Physical Exam (combine w/ comprehensive assessment (listed just 1 20 below) when performed on new pt. evals) X - Comprehensive Assessment (HX, ROS, Risk Assessments, Wounds Hx, etc.) 1 25 ASSESSMENTS - Wound and Skin Assessment / Reassessment []  - Dermatologic / Skin Assessment (not related to wound area) 0 ASSESSMENTS - Ostomy and/or Continence Assessment and Care []  - Incontinence Assessment and Management 0 []  - Ostomy Care Assessment and Management (repouching, etc.) 0 PROCESS - Coordination of Care X - Simple Patient / Family Education for ongoing care 1 15 []  - Complex (extensive) Patient / Family Education for ongoing care 0 X - Staff obtains Programmer, systems, Records, Test Results / Process Orders 1 10 []  - Staff telephones HHA, Nursing Homes / Clarify orders / etc 0 []  - Routine Transfer to another Facility (non-emergent condition) 0 []  - Routine Hospital Admission (non-emergent condition) 0 X - New Admissions / Biomedical engineer / Ordering NPWT, Apligraf, etc. 1 15 []  - Emergency Hospital Admission (emergent condition) 0 PROCESS - Special Needs []  - Pediatric / Minor Patient Management 0 []  - Isolation Patient Management 0 ARTA, INES. ([id number]) []  - Hearing / Language / Visual  special needs 0 []  - Assessment of Community assistance (transportation, D/C planning, etc.) 0 []  - Additional assistance / Altered mentation 0 []  - Support Surface(s) Assessment (bed, cushion, seat, etc.) 0 INTERVENTIONS - Miscellaneous []  - External ear exam 0 []  - Patient Transfer (multiple staff / Civil Service fast streamer / Similar devices) 0 []  - Simple Staple / Suture removal (25  or less) 0 []  - Complex Staple / Suture removal (26 or more) 0 []  - Hypo/Hyperglycemic Management (do not check if billed separately) 0 X - Ankle / Brachial Index (ABI) - do not check if billed separately 1 15 Has the patient been seen at the hospital within the last three years: Yes Total Score: 100 Level Of Care: New/Established - Level 3 Electronic Signature(s) Signed: 12/31/2015 5:32:33 PM By: Regan Lemming BSN, RN Entered By: Regan Lemming on 12/31/2015 08:42:11 [name] ([id number]) -------------------------------------------------------------------------------- Encounter Discharge Information Details Patient Name: [name] Date of Service: 12/31/2015 8:00 AM Medical Record Number: [id number] Patient Account Number: [id number] Date of Birth/Sex: 1945-09-11 (70 y.o. Female) Treating RN: Montey Hora Primary Care Physician: Caryl Bis, ERIC Other Clinician: Referring Physician: Caryl Bis, ERIC Treating Physician/Extender: Frann Rider in Treatment: 0 Encounter Discharge Information Items Discharge Pain Level: 0 Discharge Condition: Stable Ambulatory Status: Ambulatory Discharge Destination: Home Transportation: Private Auto Accompanied By: spouse Schedule Follow-up Appointment: Yes Medication Reconciliation completed and provided to Patient/Care No Delylah Stanczyk: Provided on Clinical Summary of Care: 12/31/2015 Form Type Recipient Paper Patient SR Electronic Signature(s) Signed: 12/31/2015 9:07:33 AM By: Montey Hora Previous Signature: 12/31/2015 9:02:08 AM Version By: Ruthine Dose Entered By: Montey Hora on 12/31/2015 09:07:33 [place], Sofie Hartigan ([id number]) -------------------------------------------------------------------------------- Lower Extremity Assessment Details Patient Name: [name] Date of Service: 12/31/2015 8:00 AM Medical Record Number: [id number] Patient Account Number: [id number] Date of Birth/Sex: August 07, 1945 (70 y.o. Female) Treating RN: Montey Hora Primary Care Physician: Caryl Bis, ERIC Other Clinician: Referring Physician: Caryl Bis, ERIC Treating Physician/Extender: Frann Rider in Treatment: 0 Edema Assessment Assessed: [Left: No] [Right: No] Edema: [Left: N] [Right: o] Calf Left: Right: Point of Measurement: 32 cm From Medial Instep 32.2 cm cm Ankle Left: Right: Point of Measurement: 10 cm From Medial Instep 20.8 cm cm Vascular Assessment Pulses: Posterior Tibial Palpable: [Left:Yes] Doppler: [Left:Monophasic] Dorsalis Pedis Palpable: [Left:Yes] Doppler: [Left:Monophasic] Extremity colors, hair growth, and conditions: Extremity Color: [Left:Normal] Hair Growth on Extremity: [Left:Yes] Temperature of Extremity: [Left:Warm] Capillary Refill: [Left:< 3 seconds] Blood Pressure: Brachial: [Left:118] Dorsalis Pedis: 122 [Left:Dorsalis Pedis:] Ankle: Posterior Tibial: 126 [Left:Posterior Tibial: 1.07] Toe Nail Assessment Left: Right: Thick: Yes Discolored: No Deformed: No Improper Length and Hygiene: No SHEY, DURAND ([id number]) Electronic Signature(s) Signed: 12/31/2015 4:52:33 PM By: Montey Hora Entered By: Montey Hora on 12/31/2015 08:25:38 [name] ([id number]) -------------------------------------------------------------------------------- Multi Wound Chart Details Patient Name: [name] Date of Service: 12/31/2015 8:00 AM Medical Record Number: [id number] Patient Account Number: [id number] Date of Birth/Sex: 1945/05/07 (70 y.o. Female) Treating RN: Baruch Gouty, RN,  BSN, Velva Harman Primary Care Physician: Caryl Bis, ERIC Other Clinician: Referring Physician: Caryl Bis, ERIC Treating Physician/Extender: Frann Rider in Treatment: 0 Vital Signs Height(in): 63 Pulse(bpm): 70 Weight(lbs): 130 Blood Pressure 118/50 (mmHg): Body Mass Index(BMI): 23 Temperature(F): 97.8 Respiratory Rate 18 (breaths/min): Photos: [N/A:N/A] Wound Location: Left Calcaneus N/A N/A Wounding Event: Pressure Injury N/A N/A Primary Etiology: Pressure Ulcer N/A N/A Secondary Etiology: Diabetic Wound/Ulcer of N/A N/A the Lower Extremity Comorbid History: Cataracts, Type I N/A N/A Diabetes, Rheumatoid Arthritis, Osteoarthritis, Neuropathy, Received Chemotherapy Date Acquired: 07/15/2015 N/A N/A Weeks of Treatment: 0 N/A N/A Wound Status: Open N/A N/A Measurements L x W x D 0.5x0.6x0.4 N/A N/A (cm) Area (cm) : 0.236 N/A N/A Volume (cm) : 0.094 N/A N/A Classification: Category/Stage III N/A N/A HBO Classification: Grade 1 N/A N/A Exudate Amount: Large N/A N/A Exudate Type: Serous N/A N/A Exudate Color: amber N/A N/A Wound Margin: Flat and Intact  N/A N/A JADEN, BURBRIDGE ([id number]) Granulation Amount: None Present (0%) N/A N/A Necrotic Amount: Large (67-100%) N/A N/A Necrotic Tissue: Eschar, Adherent Slough N/A N/A Exposed Structures: Fascia: No N/A N/A Fat: No Tendon: No Muscle: No Joint: No Bone: No Limited to Skin Breakdown Epithelialization: None N/A N/A Periwound Skin Texture: Edema: No N/A N/A Excoriation: No Induration: No Callus: No Crepitus: No Fluctuance: No Friable: No Rash: No Scarring: No Periwound Skin Moist: Yes N/A N/A Moisture: Maceration: No Dry/Scaly: No Periwound Skin Color: Erythema: Yes N/A N/A Atrophie Blanche: No Cyanosis: No Ecchymosis: No Hemosiderin Staining: No Mottled: No Pallor: No Rubor: No Erythema Location: Circumferential N/A N/A Temperature: No Abnormality N/A N/A Tenderness on Yes N/A  N/A Palpation: Wound Preparation: Ulcer Cleansing: N/A N/A Rinsed/Irrigated with Saline Topical Anesthetic Applied: Other: lidocaine 4% Treatment Notes Electronic Signature(s) Signed: 12/31/2015 5:32:33 PM By: Regan Lemming BSN, RN Entered By: Regan Lemming on 12/31/2015 08:38:33 ASHANTE, RODRIGUEZ ([id number]) Stann Mainland, Sofie Hartigan ([id number]) -------------------------------------------------------------------------------- [place] Details Patient Name: [name] Date of Service: 12/31/2015 8:00 AM Medical Record Number: [id number] Patient Account Number: [id number] Date of Birth/Sex: 12/15/1945 (70 y.o. Female) Treating RN: Afful, RN, BSN, [place] Primary Care Physician: Caryl Bis, ERIC Other Clinician: Referring Physician: Caryl Bis, ERIC Treating Physician/Extender: Frann Rider in Treatment: 0 Active Inactive Orientation to the Wound Care Program Nursing Diagnoses: Knowledge deficit related to the wound healing center program Goals: Patient/caregiver will verbalize understanding of the [facility] Date Initiated: 12/31/2015 Goal Status: Active Interventions: Provide education on orientation to the wound center Notes: Pressure Nursing Diagnoses: Knowledge deficit related to causes and risk factors for pressure ulcer development Knowledge deficit related to management of pressures ulcers Potential for impaired tissue integrity related to pressure, friction, moisture, and shear Goals: Patient will remain free from development of additional pressure ulcers Date Initiated: 12/31/2015 Goal Status: Active Patient will remain free of pressure ulcers Date Initiated: 12/31/2015 Goal Status: Active Patient/caregiver will verbalize risk factors for pressure ulcer development Date Initiated: 12/31/2015 Goal Status: Active Patient/caregiver will verbalize understanding of pressure ulcer management Date Initiated: 12/31/2015 Goal Status:  Active Interventions: SOLENA, CRISMAN ([id number]) Assess: immobility, friction, shearing, incontinence upon admission and as needed Assess offloading mechanisms upon admission and as needed Assess potential for pressure ulcer upon admission and as needed Provide education on pressure ulcers Treatment Activities: Patient referred for pressure reduction/relief devices : 12/31/2015 Notes: Wound/Skin Impairment Nursing Diagnoses: Impaired tissue integrity Knowledge deficit related to smoking impact on wound healing Knowledge deficit related to ulceration/compromised skin integrity Goals: Patient/caregiver will verbalize understanding of skin care regimen Date Initiated: 12/31/2015 Goal Status: Active Ulcer/skin breakdown will have a volume reduction of 30% by week 4 Date Initiated: 12/31/2015 Goal Status: Active Ulcer/skin breakdown will have a volume reduction of 50% by week 8 Date Initiated: 12/31/2015 Goal Status: Active Ulcer/skin breakdown will have a volume reduction of 80% by week 12 Date Initiated: 12/31/2015 Goal Status: Active Ulcer/skin breakdown will heal within 14 weeks Date Initiated: 12/31/2015 Goal Status: Active Interventions: Assess patient/caregiver ability to obtain necessary supplies Assess patient/caregiver ability to perform ulcer/skin care regimen upon admission and as needed Assess ulceration(s) every visit Provide education on ulcer and skin care Treatment Activities: Skin care regimen initiated : 12/31/2015 Topical wound management initiated : 12/31/2015 Notes: GRETCHEN, MIGLIORI ([id number]) Electronic Signature(s) Signed: 12/31/2015 5:32:33 PM By: Regan Lemming BSN, RN Entered By: Regan Lemming on 12/31/2015 08:36:19 [name] ([id number]) -------------------------------------------------------------------------------- Pain Assessment Details Patient Name: VALIRE, RUSSAK.  Date of Service: 12/31/2015 8:00 AM Medical Record Number: [id number] Patient  Account Number: [id number] Date of Birth/Sex: [birth year] (70 y.o. Female) Treating RN: Montey Hora Primary Care Physician: Caryl Bis, ERIC Other Clinician: Referring Physician: Caryl Bis, ERIC Treating Physician/Extender: Frann Rider in Treatment: 0 Active Problems Location of Pain Severity and Description of Pain Patient Has Paino No Site Locations Pain Management and Medication Current Pain Management: Notes Topical or injectable lidocaine is offered to patient for acute pain when surgical debridement is performed. If needed, Patient is instructed to use over the counter pain medication for the following 24-48 hours after debridement. Wound care MDs do not prescribed pain medications. Patient has chronic pain or uncontrolled pain. Patient has been instructed to make an appointment with their Primary Care Physician for pain management. Electronic Signature(s) Signed: 12/31/2015 4:52:33 PM By: Montey Hora Entered By: Montey Hora on 12/31/2015 08:07:26 [name] ([id number]) -------------------------------------------------------------------------------- Patient/Caregiver Education Details Patient Name: [name] Date of Service: 12/31/2015 8:00 AM Medical Record Number: [id number] Patient Account Number: [id number] Date of Birth/Gender: 21-Mar-1946 (70 y.o. Female) Treating RN: Montey Hora Primary Care Physician: Caryl Bis, ERIC Other Clinician: Referring Physician: Caryl Bis, ERIC Treating Physician/Extender: Frann Rider in Treatment: 0 Education Assessment Education Provided To: Patient and Caregiver Education Topics Provided Wound/Skin Impairment: Handouts: Other: wound care as ordered Methods: Demonstration, Explain/Verbal Responses: State content correctly Electronic Signature(s) Signed: 12/31/2015 4:52:33 PM By: Montey Hora Entered By: Montey Hora on 12/31/2015 09:07:53 [name]  ([id number]) -------------------------------------------------------------------------------- Wound Assessment Details Patient Name: [name] Date of Service: 12/31/2015 8:00 AM Medical Record Number: [id number] Patient Account Number: [id number] Date of Birth/Sex: 07/02/1945 (70 y.o. Female) Treating RN: Montey Hora Primary Care Physician: Caryl Bis, ERIC Other Clinician: Referring Physician: Caryl Bis, ERIC Treating Physician/Extender: Frann Rider in Treatment: 0 Wound Status Wound Number: 2 Primary Pressure Ulcer Etiology: Wound Location: Left Calcaneus Secondary Diabetic Wound/Ulcer of the Lower Wounding Event: Pressure Injury Etiology: Extremity Date Acquired: 07/15/2015 Wound Open Weeks Of Treatment: 0 Status: Clustered Wound: No Comorbid Cataracts, Type I Diabetes, History: Rheumatoid Arthritis, Osteoarthritis, Neuropathy, Received Chemotherapy Photos Wound Measurements Length: (cm) 0.5 Width: (cm) 0.6 Depth: (cm) 0.4 Area: (cm) 0.236 Volume: (cm) 0.094 % Reduction in Area: % Reduction in Volume: Epithelialization: None Tunneling: No Undermining: No Wound Description Classification: Category/Stage III Diabetic Severity Earleen Newport): Grade 1 Wound Margin: Flat and Intact Exudate Amount: Large Exudate Type: Serous Exudate Color: amber Foul Odor After Cleansing: No Wound Bed Granulation Amount: None Present (0%) Exposed Structure Necrotic Amount: Large (67-100%) Fascia Exposed: No HENNIE, [place] ([id number]) Necrotic Quality: Eschar, Adherent Slough Fat Layer Exposed: No Tendon Exposed: No Muscle Exposed: No Joint Exposed: No Bone Exposed: No Limited to Skin Breakdown Periwound Skin Texture Texture Color No Abnormalities Noted: No No Abnormalities Noted: No Callus: No Atrophie Blanche: No Crepitus: No Cyanosis: No Excoriation: No Ecchymosis: No Fluctuance: No Erythema: Yes Friable: No Erythema Location:  Circumferential Induration: No Hemosiderin Staining: No Localized Edema: No Mottled: No Rash: No Pallor: No Scarring: No Rubor: No Moisture Temperature / Pain No Abnormalities Noted: No Temperature: No Abnormality Dry / Scaly: No Tenderness on Palpation: Yes Maceration: No Moist: Yes Wound Preparation Ulcer Cleansing: Rinsed/Irrigated with Saline Topical Anesthetic Applied: Other: lidocaine 4%, Treatment Notes Wound #2 (Left Calcaneus) 1. Cleansed with: Clean wound with Normal Saline 2. Anesthetic Topical Lidocaine 4% cream to wound bed prior to debridement 4. Dressing Applied: Santyl Ointment Plain packing gauze 5. Secondary Dressing Applied Gauze and Kerlix/Conform 7. Secured with Tape Notes  netting Electronic Signature(s) CAY, MEANOR ([id number]) Signed: 12/31/2015 4:52:33 PM By: Montey Hora Entered By: Montey Hora on 12/31/2015 08:20:53 AABHA, KLOSKY ([id number]) -------------------------------------------------------------------------------- Vitals Details Patient Name: [name] Date of Service: 12/31/2015 8:00 AM Medical Record Number: [id number] Patient Account Number: [id number] Date of Birth/Sex: 02-14-1946 (70 y.o. Female) Treating RN: Montey Hora Primary Care Physician: Caryl Bis, ERIC Other Clinician: Referring Physician: Caryl Bis, ERIC Treating Physician/Extender: Frann Rider in Treatment: 0 Vital Signs Time Taken: 08:09 Temperature (F): 97.8 Height (in): 63 Pulse (bpm): 70 Source: Stated Respiratory Rate (breaths/min): 18 Weight (lbs): 130 Blood Pressure (mmHg): 118/50 Source: Stated Reference Range: 80 - 120 mg / dl Body Mass Index (BMI): 23 Electronic Signature(s) Signed: 12/31/2015 4:52:33 PM By: Montey Hora Entered By: Montey Hora on 12/31/2015 08:10:01

## 2016-01-01 NOTE — Progress Notes (Signed)
[name], [name] ([id number]) Visit Report for 12/31/2015 Chief Complaint Document Details Patient Name: [name], [name] Date of Service: 12/31/2015 8:00 AM Medical Record Number: [id number] Patient Account Number: [id number] Date of Birth/Sex: 04/17/1946 (70 y.o. Female) Treating [name]: [name] Primary Care Physician: [name] Other Clinician: Referring Physician: Caryl Bis, [name] Treating Physician/Extender: [name] in Treatment: 0 Information Obtained from: Patient Chief Complaint Patient is at the clinic for treatment of an open pressure ulcer to the left heel which she has for over 5 months Electronic Signature(s) Signed: 12/31/2015 9:20:11 AM By: Janet Fudge MD, FACS Entered By: [name] on 12/31/2015 09:20:11 [name] ([id number]) -------------------------------------------------------------------------------- Debridement Details Patient Name: [name] Date of Service: 12/31/2015 8:00 AM Medical Record Number: [id number] Patient Account Number: [id number] Date of Birth/Sex: [birth year] (70 y.o. Female) Treating [name]: [name] Primary Care Physician: [name] Other Clinician: Referring Physician: Caryl Bis, [name] Treating Physician/Extender: [name] in Treatment: 0 Debridement Performed for Wound #2 Left Calcaneus Assessment: Performed By: Physician Janet Fudge, MD Debridement: Debridement Pre-procedure Yes - 08:36 Verification/Time Out Taken: Start Time: 08:36 [name] Control: Lidocaine 4% Topical Solution Level: Skin/Subcutaneous Tissue Total Area Debrided (L x 0.5 (cm) x 0.6 (cm) = 0.3 (cm) W): Tissue and other Viable, Non-Viable, Fibrin/Slough, Subcutaneous material debrided: Instrument: Curette Bleeding: Minimum Hemostasis Achieved: Pressure End Time: 08:41 Procedural [name]: 0 Post Procedural [name]: 0 Response to Treatment: Procedure was tolerated well Post Debridement Measurements of  Total Wound Length: (cm) 0.5 Stage: Category/Stage III Width: (cm) 0.6 Depth: (cm) 0.5 Volume: (cm) 0.118 Character of Wound/Ulcer Post Requires Further Debridement: Debridement Severity of Tissue Post Fat layer exposed Debridement: Post Procedure Diagnosis Same as Pre-procedure Electronic Signature(s) Signed: 12/31/2015 9:19:37 AM By: Janet Fudge MD, FACS Signed: 12/31/2015 4:52:33 PM By: [name] Entered By: [name] on 12/31/2015 09:19:37 [name] ([name], [name] ([id number]) -------------------------------------------------------------------------------- HPI Details Patient Name: [name] Date of Service: 12/31/2015 8:00 AM Medical Record Number: [id number] Patient Account Number: [id number] Date of Birth/Sex: [birth year] (70 y.o. Female) Treating [name]: [name] Primary Care Physician: [name] Other Clinician: Referring Physician: Caryl Bis, [name] Treating Physician/Extender: [name] in Treatment: 0 History of Present Illness Location: ulcerated on the right heel Quality: Patient reports experiencing a dull [name] to affected area(s). Severity: Patient states wound are getting worse. Duration: Patient has had the wound for <5 months prior to presenting for treatment Timing: [name] in wound is Intermittent (comes and goes Context: The wound appeared gradually over time Modifying Factors: Consults to this date include: local care was done as per the medication given by the PCP which may have been medihoney Associated Signs and Symptoms: Patient reports having difficulty standing for long periods. 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 In the past she was seen for a decubitus ulcer on her right gluteal area. She  is known to have diabetes mellitus type 1 and her most recent A1c was 7.2% Past medical history significant for leukopenia, cholelithiasis, hypertension, chronic diastolic CHF, coronary artery disease,type 1 diabetes mellitus, rheumatoid arthritis, collagen vascular disease, ovarian cancer, status post CABG in 2014 and status post Port-A-Cath insertion by Dr. Leotis [name] in October 2016. She has quit smoking about 20 years ago. She was advised to use Medihoney with calcium alginate pads to be  applied over the wound. 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 Electronic Signature(s) Signed: 12/31/2015 9:22:24 AM By: Janet Fudge MD, FACS Previous Signature: 12/31/2015 8:31:59 AM Version By: Janet Fudge MD, FACS Entered By: [name] on 12/31/2015 09:22:23 [name] ([id number]) -------------------------------------------------------------------------------- Physical Exam Details Patient Name: [name] Date of Service: 12/31/2015 8:00 AM Medical Record Number: [id number] Patient Account Number: [id number] Date of Birth/Sex: Aug 22, 1945 (70 y.o. Female) Treating [name]: [name] Primary Care Physician: [name] Other Clinician: Referring Physician: Caryl Bis, [name] Treating Physician/Extender: [name] in Treatment: 0 Constitutional . Pulse regular. Respirations normal and unlabored. Afebrile. . Eyes Nonicteric. Reactive to light. Ears, Nose, Mouth, and Throat Lips, teeth, and gums WNL.Marland Kitchen Moist mucosa without lesions. Neck supple and nontender. No palpable supraclavicular or cervical adenopathy. Normal sized without goiter. Respiratory WNL. No  retractions.. Breath sounds WNL, No rubs, rales, rhonchi, or wheeze.. Chest Breasts symmetical and no nipple discharge.. Breast tissue WNL, no masses, lumps, or tenderness.. Gastrointestinal (GI) Abdomen without masses or tenderness.. No liver or spleen enlargement or tenderness.. Lymphatic No adneopathy. No adenopathy. No adenopathy. Musculoskeletal Adexa without tenderness or enlargement.. Digits and nails w/o clubbing, cyanosis, infection, petechiae, ischemia, or inflammatory conditions.. Integumentary (Hair, Skin) No suspicious lesions. No crepitus or fluctuance. No peri-wound warmth or erythema. No masses.Marland Kitchen Psychiatric Judgement and insight Intact.. No evidence of depression, anxiety, or agitation.. Notes has got a stage III pressure ulcer on the left calcaneal region and this is almost punched out and has a lot of necrotic debris at the base. Sharp dissection was done with a curet and minimal bleeding controlled with pressure. Electronic Signature(s) Signed: 12/31/2015 9:23:23 AM By: Janet Fudge MD, FACS Entered By: [name] on 12/31/2015 09:23:23 [name] ([id number]) -------------------------------------------------------------------------------- Physician Orders Details Patient Name: [name] Date of Service: 12/31/2015 8:00 AM Medical Record Patient Account Number: [id number] Number: Afful, [name], [name], Treating [name]: 11-23-1945 (70 y.o. [name] Date of Birth/Sex: Female) Other Clinician: Primary Care Physician: [name] Treating [name] Referring Physician: Caryl Bis, [name] Physician/Extender: Suella Grove in Treatment: 0 Verbal / Phone Orders: Yes Clinician: Afful, [name], [name], [name] Read Back and Verified: Yes Diagnosis Coding Wound Cleansing Wound #2 Left Calcaneus o Cleanse wound with mild soap and water o May Shower, gently pat wound dry prior to applying new dressing. Anesthetic Wound #2 Left Calcaneus o Topical Lidocaine  4% cream applied to wound bed prior to debridement Primary Wound Dressing Wound #2 Left Calcaneus o Santyl Ointment o Medihoney gel - Use if you cannot afford SAntyl Secondary Dressing Wound #2 Left Calcaneus o Gauze and Kerlix/Conform Dressing Change Frequency Wound #2 Left Calcaneus o Change dressing every day. Follow-up Appointments Wound #2 Left Calcaneus o Return Appointment in 1 week. Off-Loading Wound #2 Left Calcaneus o Heel suspension boot to: - ELevate on pillows Additional Orders / Instructions Wound #2 Left Calcaneus o Increase protein intake. [name], [name] ([id number]) o Activity as tolerated o Other: - INclude Vitamin A, C, ZInc, MVI in diet Medications-please add to medication list. Wound #2 Left Calcaneus o Santyl Enzymatic Ointment Radiology o X-ray, heel - Left heel, 3 plus view Electronic Signature(s) Signed: 12/31/2015 4:38:34 PM By: Janet Fudge MD,  FACS Signed: 12/31/2015 5:32:33 PM By: Regan Lemming BSN, [name] Entered By: Regan Lemming on 12/31/2015 08:46:25 [name] ([id number]) -------------------------------------------------------------------------------- Problem List Details Patient Name: [name] Date of Service: 12/31/2015 8:00 AM Medical Record Number: [id number] Patient Account Number: [id number] Date of Birth/Sex: [birth year] (70 y.o. Female) Treating [name]: [name] Primary Care Physician: [name] Other Clinician: Referring Physician: Caryl Bis, [name] Treating Physician/Extender: [name] in Treatment: 0 Active Problems ICD-10 Encounter Code Description Active Date Diagnosis E10.621 Type 1 diabetes mellitus with foot ulcer 12/31/2015 Yes L89.623 Pressure ulcer of left heel, stage 3 12/31/2015 Yes Z92.21 Personal history of antineoplastic chemotherapy 12/31/2015 Yes Z79.52 Long term (current) use of systemic steroids 12/31/2015 Yes Inactive Problems Resolved Problems Electronic  Signature(s) Signed: 12/31/2015 9:19:18 AM By: Janet Fudge MD, FACS Entered By: [name] on 12/31/2015 09:19:18 [name] ([id number]) -------------------------------------------------------------------------------- Progress Note Details Patient Name: [name] Date of Service: 12/31/2015 8:00 AM Medical Record Number: [id number] Patient Account Number: [id number] Date of Birth/Sex: [birth year] (70 y.o. Female) Treating [name]: [name] Primary Care Physician: [name] Other Clinician: Referring Physician: Caryl Bis, [name] Treating Physician/Extender: [name] in Treatment: 0 Subjective Chief Complaint Information obtained from Patient Patient is at the clinic for treatment of an open pressure ulcer to the left heel which she has for over 5 months History of Present Illness (HPI) The following HPI elements were documented for the patient's wound: Location: ulcerated on the right heel Quality: Patient reports experiencing a dull [name] to affected area(s). Severity: Patient states wound are getting worse. Duration: Patient has had the wound for Timing: [name] in wound is Intermittent (comes and goes Context: The wound appeared gradually over time Modifying Factors: Consults to this date include: local care was done as per the medication given by the PCP which may have been medihoney Associated Signs and Symptoms: Patient reports having difficulty standing for long periods. 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 In the past she was seen for a decubitus ulcer on her right gluteal area. She is known to have diabetes mellitus type 1 and her most recent A1c was 7.2% Past medical history significant for leukopenia, cholelithiasis, hypertension, chronic diastolic CHF,  coronary artery disease,type 1 diabetes mellitus, rheumatoid arthritis, collagen vascular disease, ovarian cancer, status post CABG in 2014 and status post Port-A-Cath insertion by Dr. Leotis [name] in October 2016. She has quit smoking about 20 years ago. She was advised to use Medihoney with calcium alginate pads to be applied over the wound. 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 Wound History Patient presents with 1 open wound that has been present for approximately March 15. Patient has been treating wound in the following manner: medihoney. Laboratory tests have been performed in the last month. Patient reportedly has not tested positive for an antibiotic resistant organism. Patient reportedly has not tested positive for osteomyelitis. Patient reportedly has not had testing performed to evaluate circulation [name], [name]. ([id number]) in the legs. Patient History Information obtained from Patient. Allergies codeine (Reaction:  nausea), latex Family History Cancer - Siblings, Diabetes - Mother, Siblings, Heart Disease - Father, Mother, Siblings, Hypertension - Siblings, Father, Mother, No family history of Hereditary Spherocytosis, Kidney Disease, Lung Disease, Seizures, Stroke, Thyroid Problems, Tuberculosis. Social History Former smoker, Marital Status - Married, Alcohol Use - Never, Drug Use - No History, Caffeine Use - Never. Medical And Surgical History Notes Cardiovascular quadruple bypass 3 years ago Medications: she is on Lovenox, Lasix, insulin, Cozaar, Lopressor, oxycodone, Protonix, prednisone, methotrexate Objective Constitutional Pulse regular. Respirations normal  and unlabored. Afebrile. Vitals Time Taken: 8:09 AM, Height: 63 in, Source: Stated, Weight: 130 lbs, Source: Stated, BMI: 23, Temperature: 97.8 F, Pulse: 70 bpm, Respiratory Rate: 18 breaths/min, Blood Pressure: 118/50 mmHg. Eyes Nonicteric. Reactive to light. Ears, Nose, Mouth, and Throat Lips, teeth, and gums WNL.Marland Kitchen Moist mucosa without lesions. Neck supple and nontender. No palpable supraclavicular or cervical adenopathy. Normal sized without goiter. Respiratory [name], [name]. ([id number]) WNL. No retractions.. Breath sounds WNL, No rubs, rales, rhonchi, or wheeze.. Chest Breasts symmetical and no nipple discharge.. Breast tissue WNL, no masses, lumps, or tenderness.. Gastrointestinal (GI) Abdomen without masses or tenderness.. No liver or spleen enlargement or tenderness.. Lymphatic No adneopathy. No adenopathy. No adenopathy. Musculoskeletal Adexa without tenderness or enlargement.. Digits and nails w/o clubbing, cyanosis, infection, petechiae, ischemia, or inflammatory conditions.Marland Kitchen Psychiatric Judgement and insight Intact.. No evidence of depression, anxiety, or agitation.. General Notes: has got a stage III pressure ulcer on the left calcaneal region and this is almost punched out and has a lot of necrotic debris at the base. Sharp dissection was done with a curet and minimal bleeding controlled with pressure. Integumentary (Hair, Skin) No suspicious lesions. No crepitus or fluctuance. No peri-wound warmth or erythema. No masses.. Wound #2 status is Open. Original cause of wound was Pressure Injury. The wound is located on the Left Calcaneus. The wound measures 0.5cm length x 0.6cm width x 0.4cm depth; 0.236cm^2 area and 0.094cm^3 volume. The wound is limited to skin breakdown. There is no tunneling or undermining noted. There is a large amount of serous drainage noted. The wound margin is flat and intact. There is no granulation within the wound bed. There is a large  (67-100%) amount of necrotic tissue within the wound bed including Eschar and Adherent Slough. The periwound skin appearance exhibited: Moist, Erythema. The periwound skin appearance did not exhibit: Callus, Crepitus, Excoriation, Fluctuance, Friable, Induration, Localized Edema, Rash, Scarring, Dry/Scaly, Maceration, Atrophie Blanche, Cyanosis, Ecchymosis, Hemosiderin Staining, Mottled, Pallor, Rubor. The surrounding wound skin color is noted with erythema which is circumferential. Periwound temperature was noted as No Abnormality. The periwound has tenderness on palpation. Assessment Active Problems ICD-10 E10.621 - Type 1 diabetes mellitus with foot ulcer L89.623 - Pressure ulcer of left heel, stage 3 Z92.21 - Personal history of antineoplastic chemotherapy Z79.52 - Long term (current) use of systemic steroids [name], [name] ([id number]) this 70 year old patient with stage IV ovarian cancer status post surgery and chemotherapy has a pressure injury to the left heel which she's had for about 5 months now. She has several comorbidities and after review I have recommended: 1. Off-Loading as much as possible and using proper footwear. 2. Santyl ointment to the wound packed with plain packing gauze, and dressings to be changed daily 3. Continue good control of her diabetes mellitus 4. Adequate protein, vitamin C, zinc 5. X-ray of the left foot 6. Regular visits to the wound center Procedures Wound #2 Wound #2 is a Pressure Ulcer located on the Left  Calcaneus . There was a Skin/Subcutaneous Tissue Debridement [id number]) debridement with total area of 0.3 sq cm performed by Janet Fudge, MD. with the following instrument(s): Curette to remove Viable and Non-Viable tissue/material including Fibrin/Slough and Subcutaneous after achieving [name] control using Lidocaine 4% Topical Solution. A time out was conducted at 08:36, prior to the start of the procedure. A Minimum amount of bleeding  was controlled with Pressure. The procedure was tolerated well with a [name] level of 0 throughout and a [name] level of 0 following the procedure. Post Debridement Measurements: 0.5cm length x 0.6cm width x 0.5cm depth; 0.118cm^3 volume. Post debridement Stage noted as Category/Stage III. Character of Wound/Ulcer Post Debridement requires further debridement. Severity of Tissue Post Debridement is: Fat layer exposed. Post procedure Diagnosis Wound #2: Same as Pre-Procedure Plan Wound Cleansing: Wound #2 Left Calcaneus: Cleanse wound with mild soap and water May Shower, gently pat wound dry prior to applying new dressing. Anesthetic: Wound #2 Left Calcaneus: Topical Lidocaine 4% cream applied to wound bed prior to debridement Primary Wound Dressing: Wound #2 Left Calcaneus: Santyl Ointment Medihoney gel - Use if you cannot afford [name], [name] ([id number]) Secondary Dressing: Wound #2 Left Calcaneus: Gauze and Kerlix/Conform Dressing Change Frequency: Wound #2 Left Calcaneus: Change dressing every day. Follow-up Appointments: Wound #2 Left Calcaneus: Return Appointment in 1 week. Off-Loading: Wound #2 Left Calcaneus: Heel suspension boot to: - ELevate on pillows Additional Orders / Instructions: Wound #2 Left Calcaneus: Increase protein intake. Activity as tolerated Other: - INclude Vitamin A, C, ZInc, MVI in diet Medications-please add to medication list.: Wound #2 Left Calcaneus: Santyl Enzymatic Ointment Radiology ordered were: X-ray, heel - Left heel, 3 plus view this 70 year old patient with stage IV ovarian cancer status post surgery and chemotherapy has a pressure injury to the left heel which she's had for about 5 months now. She has several comorbidities and after review I have recommended: 1. Off-Loading as much as possible and using proper footwear. 2. Santyl ointment to the wound packed with plain packing gauze, and dressings to be changed daily 3.  Continue good control of her diabetes mellitus 4. Adequate protein, vitamin C, zinc 5. X-ray of the left foot 6. Regular visits to the wound center Electronic Signature(s) Signed: 12/31/2015 9:27:19 AM By: Janet Fudge MD, FACS Entered By: [name] on 12/31/2015 09:27:18 [name] ([id number]) -------------------------------------------------------------------------------- ROS/PFSH Details Patient Name: [name] Date of Service: 12/31/2015 8:00 AM Medical Record Number: [id number] Patient Account Number: [id number] Date of Birth/Sex: January 23, 1946 (70 y.o. Female) Treating [name]: [name] Primary Care Physician: [name] Other Clinician: Referring Physician: Caryl Bis, [name] Treating Physician/Extender: [name] in Treatment: 0 Information Obtained From Patient Wound History Do you currently have one or more open woundso Yes How many open wounds do you currently haveo 1 Approximately how long have you had your woundso March 15 How have you been treating your wound(s) until nowo medihoney Has your wound(s) ever healed and then re-openedo No Have you tested positive for an antibiotic resistant organism (MRSA, VRE)o No Have you tested positive for osteomyelitis (bone infection)o No Have you had any tests for circulation on your legso No Eyes Medical History: Positive for: Cataracts - both eyes Negative for: Glaucoma; Optic Neuritis Hematologic/Lymphatic Medical History: Negative for: Anemia; Hemophilia; Human Immunodeficiency Virus; Lymphedema; Sickle Cell Disease Respiratory Medical History: Negative for: Aspiration; Asthma; Chronic Obstructive Pulmonary Disease (COPD); Pneumothorax; Sleep Apnea; Tuberculosis Cardiovascular Medical History: Past Medical History Notes: quadruple bypass 3 years ago  Gastrointestinal Medical History: Negative for: Cirrhosis ; Colitis; Crohnos; Hepatitis A; Hepatitis B; Hepatitis C Endocrine [name], [name]. ([id number]) Medical History: Positive for: Type I Diabetes - over 70 years Time with diabetes: 39 years Treated with: Insulin Blood sugar tested every day: Yes Tested : 4-5times a day Blood sugar testing results: Lunch: 263 Genitourinary Medical History: Negative for: End Stage Renal Disease Immunological Medical History: Negative for: Lupus Erythematosus; Raynaudos; Scleroderma Integumentary (Skin) Medical History: Negative for: History of Burn; History of pressure wounds Musculoskeletal Medical History: Positive for: Rheumatoid Arthritis; Osteoarthritis Neurologic Medical History: Positive for: Neuropathy Negative for: Dementia Oncologic Medical History: Positive for: Received Chemotherapy HBO Extended History Items Eyes: Cataracts Immunizations Pneumococcal Vaccine: Received Pneumococcal Vaccination: Yes Immunization Notes: up to date Family and Social History [name], SCHMIED ([id number]) Cancer: Yes - Siblings; Diabetes: Yes - Mother, Siblings; Heart Disease: Yes - Father, Mother, Siblings; Hereditary Spherocytosis: No; Hypertension: Yes - Siblings, Father, Mother; Kidney Disease: No; Lung Disease: No; Seizures: No; Stroke: No; Thyroid Problems: No; Tuberculosis: No; Former smoker; Marital Status - Married; Alcohol Use: Never; Drug Use: No History; Caffeine Use: Never; Financial Concerns: No; Food, Clothing or Shelter Needs: No; Support System Lacking: No; Transportation Concerns: No; Advanced Directives: Yes (Not Provided); Patient does not want information on Advanced Directives; Living Will: Yes (Not Provided); Medical Power of Attorney: Yes - Emony Shao (Not Provided) Physician Affirmation I have reviewed and agree with the above information. Electronic Signature(s) Signed: 12/31/2015 4:38:34 PM By: Janet Fudge MD, FACS Signed: 12/31/2015 4:52:33 PM By: [name] Entered By: [name] on 12/31/2015 08:27:00 [name]  ([id number]) -------------------------------------------------------------------------------- SuperBill Details Patient Name: [name] Date of Service: 12/31/2015 Medical Record Number: [id number] Patient Account Number: [id number] Date of Birth/Sex: 1946-04-28 (70 y.o. Female) Treating [name]: [name] Primary Care Physician: [name] Other Clinician: Referring Physician: Caryl Bis, [name] Treating Physician/Extender: [name] in Treatment: 0 Diagnosis Coding ICD-10 Codes Code Description E10.621 Type 1 diabetes mellitus with foot ulcer L89.623 Pressure ulcer of left heel, stage 3 Z92.21 Personal history of antineoplastic chemotherapy Z79.52 Long term (current) use of systemic steroids Facility Procedures CPT4 Code: [id number] Description: 99213 - WOUND CARE VISIT-LEV 3 EST PT Modifier: Quantity: 1 CPT4 Code: [id number] Description: 11042 - DEB SUBQ TISSUE 20 SQ CM/< ICD-10 Description Diagnosis E10.621 Type 1 diabetes mellitus with foot ulcer L89.623 Pressure ulcer of left heel, stage 3 Modifier: Quantity: 1 Physician Procedures CPT4 Code: [id number] Description: [id number] - WC PHYS LEVEL 4 - EST PT ICD-10 Description Diagnosis E10.621 Type 1 diabetes mellitus with foot ulcer L89.623 Pressure ulcer of left heel, stage 3 Z92.21 Personal history of antineoplastic chemotherap Z79.52 Long term (current) use  of systemic steroids Modifier: 30 y Quantity: 1 CPT4 Code: [id number] Ahuja, Katharine Look Description: 11042 - WC PHYS SUBQ TISS 20 SQ CM ICD-10 Description Diagnosis E10.621 Type 1 diabetes mellitus with foot ulcer L89.623 Pressure ulcer of left heel, stage 3 W. ([id number]) Modifier: Quantity: 1 Electronic Signature(s) Signed: 12/31/2015 9:27:46 AM By: Janet Fudge MD, FACS Entered By: [name] on 12/31/2015 09:27:45

## 2016-01-07 ENCOUNTER — Encounter: Payer: Medicare Other | Attending: Surgery | Admitting: Surgery

## 2016-01-07 DIAGNOSIS — Z7952 Long term (current) use of systemic steroids: Secondary | ICD-10-CM | POA: Insufficient documentation

## 2016-01-07 DIAGNOSIS — I5032 Chronic diastolic (congestive) heart failure: Secondary | ICD-10-CM | POA: Insufficient documentation

## 2016-01-07 DIAGNOSIS — L89623 Pressure ulcer of left heel, stage 3: Secondary | ICD-10-CM | POA: Diagnosis not present

## 2016-01-07 DIAGNOSIS — Z794 Long term (current) use of insulin: Secondary | ICD-10-CM | POA: Insufficient documentation

## 2016-01-07 DIAGNOSIS — E10621 Type 1 diabetes mellitus with foot ulcer: Secondary | ICD-10-CM | POA: Diagnosis not present

## 2016-01-07 DIAGNOSIS — C569 Malignant neoplasm of unspecified ovary: Secondary | ICD-10-CM | POA: Diagnosis not present

## 2016-01-07 DIAGNOSIS — Z79899 Other long term (current) drug therapy: Secondary | ICD-10-CM | POA: Diagnosis not present

## 2016-01-07 DIAGNOSIS — C787 Secondary malignant neoplasm of liver and intrahepatic bile duct: Secondary | ICD-10-CM | POA: Insufficient documentation

## 2016-01-07 DIAGNOSIS — E1040 Type 1 diabetes mellitus with diabetic neuropathy, unspecified: Secondary | ICD-10-CM | POA: Insufficient documentation

## 2016-01-07 DIAGNOSIS — Z9221 Personal history of antineoplastic chemotherapy: Secondary | ICD-10-CM | POA: Diagnosis not present

## 2016-01-07 DIAGNOSIS — M069 Rheumatoid arthritis, unspecified: Secondary | ICD-10-CM | POA: Diagnosis not present

## 2016-01-07 DIAGNOSIS — I2510 Atherosclerotic heart disease of native coronary artery without angina pectoris: Secondary | ICD-10-CM | POA: Insufficient documentation

## 2016-01-07 DIAGNOSIS — Z87891 Personal history of nicotine dependence: Secondary | ICD-10-CM | POA: Insufficient documentation

## 2016-01-07 DIAGNOSIS — I110 Hypertensive heart disease with heart failure: Secondary | ICD-10-CM | POA: Diagnosis not present

## 2016-01-08 NOTE — Progress Notes (Addendum)
[name] ([id number]) Visit Report for 01/07/2016 Chief Complaint Document Details Patient Name: [name], [name] Date of Service: 01/07/2016 1:30 PM Medical Record Number: [id number] Patient Account Number: [id number] Date of Birth/Sex: 1945-12-04 (70 y.o. Female) Treating RN: Montey Hora Primary Care Physician: Caryl Bis, ERIC Other Clinician: Referring Physician: Caryl Bis, ERIC Treating Physician/Extender: Frann Rider in Treatment: 1 Information Obtained from: Patient Chief Complaint Patient is at the clinic for treatment of an open pressure ulcer to the left heel which she has for over 5 months Electronic Signature(s) Signed: 01/07/2016 2:14:15 PM By: Christin Fudge MD, FACS Entered By: Christin Fudge on 01/07/2016 14:14:14 [name] ([id number]) -------------------------------------------------------------------------------- Debridement Details Patient Name: [name] Date of Service: 01/07/2016 1:30 PM Medical Record Number: [id number] Patient Account Number: [id number] Date of Birth/Sex: 11/03/1945 (70 y.o. Female) Treating RN: Montey Hora Primary Care Physician: Caryl Bis, ERIC Other Clinician: Referring Physician: Caryl Bis, ERIC Treating Physician/Extender: Frann Rider in Treatment: 1 Debridement Performed for Wound #2 Left Calcaneus Assessment: Performed By: Physician Christin Fudge, MD Debridement: Debridement Pre-procedure Yes - 13:49 Verification/Time Out Taken: Start Time: 13:49 Pain Control: Lidocaine 4% Topical Solution Level: Skin/Subcutaneous Tissue Total Area Debrided (L x 0.5 (cm) x 0.5 (cm) = 0.25 (cm) W): Tissue and other Non-Viable, Fibrin/Slough, Subcutaneous material debrided: Instrument: Curette Bleeding: Moderate Hemostasis Achieved: Pressure End Time: 13:54 Procedural Pain: 0 Post Procedural Pain: 0 Response to Treatment: Procedure was tolerated well Post Debridement Measurements of Total  Wound Length: (cm) 0.5 Stage: Category/Stage III Width: (cm) 0.5 Depth: (cm) 0.7 Volume: (cm) 0.137 Character of Wound/Ulcer Post Requires Further Debridement: Debridement Severity of Tissue Post Fat layer exposed Debridement: Post Procedure Diagnosis Same as Pre-procedure Electronic Signature(s) Signed: 01/07/2016 2:14:07 PM By: Christin Fudge MD, FACS Signed: 01/07/2016 5:53:50 PM By: Montey Hora Entered By: Christin Fudge on 01/07/2016 14:14:06 [name] ([id number] Mainland, Sofie Hartigan ([id number]) -------------------------------------------------------------------------------- HPI Details Patient Name: [name] Date of Service: 01/07/2016 1:30 PM Medical Record Number: [id number] Patient Account Number: [id number] Date of Birth/Sex: 1946-02-01 (70 y.o. Female) Treating RN: Montey Hora Primary Care Physician: Caryl Bis, ERIC Other Clinician: Referring Physician: Caryl Bis, ERIC Treating Physician/Extender: Frann Rider in Treatment: 1 History of Present Illness Location: ulcerated on the right heel Quality: Patient reports experiencing a dull pain to affected area(s). Severity: Patient states wound are getting worse. Duration: Patient has had the wound for <5 months prior to presenting for treatment Timing: Pain in wound is Intermittent (comes and goes Context: The wound appeared gradually over time Modifying Factors: Consults to this date include: local care was done as per the medication given by the PCP which may have been medihoney Associated Signs and Symptoms: Patient reports having difficulty standing for long periods. 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 In the past she was seen for a decubitus ulcer on her right gluteal area. She is known  to have diabetes mellitus type 1 and her most recent A1c was 7.2% Past medical history significant for leukopenia, cholelithiasis, hypertension, chronic diastolic CHF, coronary artery disease,type 1 diabetes mellitus, rheumatoid arthritis, collagen vascular disease, ovarian cancer, status post CABG in 2014 and status post Port-A-Cath insertion by Dr. Leotis Pain in October 2016. She has quit smoking about 20 years ago. She was advised to use Medihoney with calcium alginate pads to be applied  over the wound. 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 01/07/2016 -- x-ray of the left heel done -- IMPRESSION: No radiographic evidence of osteomyelitis. If this remains a clinical concern, recommend MR imaging. Electronic Signature(s) Signed: 01/07/2016 2:14:23 PM By: Christin Fudge MD, FACS Previous Signature: 01/07/2016 1:25:47 PM Version By: Christin Fudge MD, FACS Entered By: Christin Fudge on 01/07/2016 14:14:23 [name] ([id number]) -------------------------------------------------------------------------------- Physical Exam Details Patient Name: [name] Date of Service: 01/07/2016 1:30 PM Medical Record Number: [id number] Patient Account Number: [id number] Date of Birth/Sex: Oct 24, 1945 (70 y.o. Female) Treating RN: Montey Hora Primary Care Physician: Caryl Bis, ERIC Other Clinician: Referring Physician: Caryl Bis, ERIC Treating Physician/Extender: Frann Rider in Treatment: 1 Constitutional . Pulse regular. Respirations normal and unlabored. Afebrile. . Eyes Nonicteric. Reactive to light. Ears, Nose, Mouth, and Throat Lips, teeth, and gums WNL.Marland Kitchen Moist mucosa  without lesions. Neck supple and nontender. No palpable supraclavicular or cervical adenopathy. Normal sized without goiter. Respiratory WNL. No retractions.. Cardiovascular Pedal Pulses WNL. No clubbing, cyanosis or edema. Lymphatic No adneopathy. No adenopathy. No adenopathy. Musculoskeletal Adexa without tenderness or enlargement.. Digits and nails w/o clubbing, cyanosis, infection, petechiae, ischemia, or inflammatory conditions.. Integumentary (Hair, Skin) No suspicious lesions. No crepitus or fluctuance. No peri-wound warmth or erythema. No masses.Marland Kitchen Psychiatric Judgement and insight Intact.. No evidence of depression, anxiety, or agitation.. Notes the ulcer on the left calcaneal region continues to be quite deep and I have removed a lot of necrotic debris using sharp dissection with #3 curet and minimal bleeding controlled with pressure. Electronic Signature(s) Signed: 01/07/2016 2:14:57 PM By: Christin Fudge MD, FACS Entered By: Christin Fudge on 01/07/2016 14:14:57 [name] ([id number]) -------------------------------------------------------------------------------- Physician Orders Details Patient Name: [name] Date of Service: 01/07/2016 1:30 PM Medical Record Patient Account Number: [id number] Number: Afful, RN, BSN, Treating RN: Jul 23, 1945 (70 y.o. Velva Harman Date of Birth/Sex: Female) Other Clinician: Primary Care Physician: Caryl Bis, ERIC Treating Christin Fudge Referring Physician: Caryl Bis, ERIC Physician/Extender: Suella Grove in Treatment: 1 Verbal / Phone Orders: Yes Clinician: Afful, RN, BSN, Rita Read Back and Verified: Yes Diagnosis Coding Wound Cleansing Wound #2 Left Calcaneus o Cleanse wound with mild soap and water o May Shower, gently pat wound dry prior to applying new dressing. Anesthetic Wound #2 Left Calcaneus o Topical Lidocaine 4% cream applied to wound bed prior to debridement Primary Wound Dressing Wound #2 Left  Calcaneus o Santyl Ointment o Medihoney gel - Use if you cannot afford SAntyl Secondary Dressing Wound #2 Left Calcaneus o Gauze and Kerlix/Conform Dressing Change Frequency Wound #2 Left Calcaneus o Change dressing every day. Follow-up Appointments Wound #2 Left Calcaneus o Return Appointment in 1 week. Off-Loading Wound #2 Left Calcaneus o Heel suspension boot to: - ELevate on pillows Additional Orders / Instructions Wound #2 Left Calcaneus o Increase protein intake. ATIRA, FRAZZINI ([id number]) o Activity as tolerated o Other: - INclude Vitamin A, C, ZInc, MVI in diet Medications-please add to medication list. Wound #2 Left Calcaneus o Santyl Enzymatic Ointment Electronic Signature(s) Signed: 01/07/2016 4:26:59 PM By: Christin Fudge MD, FACS Signed: 01/07/2016 5:31:48 PM By: Regan Lemming BSN, RN Entered By: Regan Lemming on 01/07/2016 13:56:04 [name] ([id number]) -------------------------------------------------------------------------------- Problem List  Details Patient Name: CRISTELLA, PITKIN Date of Service: 01/07/2016 1:30 PM Medical Record Number: [id number] Patient Account Number: [id number] Date of Birth/Sex: 21-Dec-1945 (70 y.o. Female) Treating RN: Montey Hora Primary Care Physician: Caryl Bis, ERIC Other Clinician: Referring Physician: Caryl Bis, ERIC Treating Physician/Extender: Frann Rider in Treatment: 1 Active Problems ICD-10 Encounter Code Description Active Date Diagnosis E10.621 Type 1 diabetes mellitus with foot ulcer 12/31/2015 Yes L89.623 Pressure ulcer of left heel, stage 3 12/31/2015 Yes Z92.21 Personal history of antineoplastic chemotherapy 12/31/2015 Yes Z79.52 Long term (current) use of systemic steroids 12/31/2015 Yes Inactive Problems Resolved Problems Electronic Signature(s) Signed: 01/07/2016 2:13:52 PM By: Christin Fudge MD, FACS Entered By: Christin Fudge on 01/07/2016 14:13:51 [name]  ([id number]) -------------------------------------------------------------------------------- Progress Note Details Patient Name: [name] Date of Service: 01/07/2016 1:30 PM Medical Record Number: [id number] Patient Account Number: [id number] Date of Birth/Sex: 01/16/1946 (70 y.o. Female) Treating RN: Montey Hora Primary Care Physician: Caryl Bis, ERIC Other Clinician: Referring Physician: Caryl Bis, ERIC Treating Physician/Extender: Frann Rider in Treatment: 1 Subjective Chief Complaint Information obtained from Patient Patient is at the clinic for treatment of an open pressure ulcer to the left heel which she has for over 5 months History of Present Illness (HPI) The following HPI elements were documented for the patient's wound: Location: ulcerated on the right heel Quality: Patient reports experiencing a dull pain to affected area(s). Severity: Patient states wound are getting worse. Duration: Patient has had the wound for Timing: Pain in wound is Intermittent (comes and goes Context: The wound appeared gradually over time Modifying Factors: Consults to this date include: local care was done as per the medication given by the PCP which may have been medihoney Associated Signs and Symptoms: Patient reports having difficulty standing for long periods. 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 In the past she was seen for a decubitus ulcer on her right gluteal area. She is known to have diabetes mellitus type 1 and her most recent A1c was 7.2% Past medical history significant for leukopenia, cholelithiasis, hypertension, chronic diastolic CHF, coronary artery disease,type 1 diabetes mellitus, rheumatoid arthritis, collagen vascular disease, ovarian cancer, status post CABG in 2014  and status post Port-A-Cath insertion by Dr. Leotis Pain in October 2016. She has quit smoking about 20 years ago. She was advised to use Medihoney with calcium alginate pads to be applied over the wound. 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 01/07/2016 -- x-ray of the left heel done -- IMPRESSION: No radiographic evidence of osteomyelitis. If this remains a clinical concern, recommend MR imaging. JISELLE, MATZNER ([id number]) Objective Constitutional Pulse regular. Respirations normal and unlabored. Afebrile. Vitals Time Taken: 1:30 PM, Height: 63 in, Weight: 130 lbs, BMI: 23, Temperature: 98.0 F, Pulse: 83 bpm, Respiratory Rate: 18 breaths/min, Blood Pressure: 96/44 mmHg. Eyes Nonicteric. Reactive to light. Ears, Nose, Mouth, and Throat Lips, teeth, and gums WNL.Marland Kitchen Moist mucosa without lesions. Neck supple and nontender. No palpable supraclavicular or cervical adenopathy. Normal sized without goiter. Respiratory WNL. No retractions.. Cardiovascular Pedal Pulses  WNL. No clubbing, cyanosis or edema. Lymphatic No adneopathy. No adenopathy. No adenopathy. Musculoskeletal Adexa without tenderness or enlargement.. Digits and nails w/o clubbing, cyanosis, infection, petechiae, ischemia, or inflammatory conditions.Marland Kitchen Psychiatric Judgement and insight Intact.. No evidence of depression, anxiety, or agitation.. General Notes: the ulcer on the left calcaneal region continues to be quite deep and I have removed a lot of necrotic debris using sharp dissection with #3 curet and minimal bleeding controlled with pressure. Integumentary (Hair, Skin) No suspicious lesions. No crepitus or  fluctuance. No peri-wound warmth or erythema. No masses.. Wound #2 status is Open. Original cause of wound was Pressure Injury. The wound is located on the Left Calcaneus. The wound measures 0.5cm length x 0.5cm width x 0.4cm depth; 0.196cm^2 area and 0.079cm^3 volume. The wound is limited to skin breakdown. There is no tunneling or undermining noted. There is a large amount of serous drainage noted. The wound margin is flat and intact. There is no granulation within the wound bed. There is a large (67-100%) amount of necrotic tissue within the wound bed including Eschar and Adherent Slough. The periwound skin appearance exhibited: Moist, Erythema. KHALEESIA, BRIEGER ([id number]) The periwound skin appearance did not exhibit: Callus, Crepitus, Excoriation, Fluctuance, Friable, Induration, Localized Edema, Rash, Scarring, Dry/Scaly, Maceration, Atrophie Blanche, Cyanosis, Ecchymosis, Hemosiderin Staining, Mottled, Pallor, Rubor. The surrounding wound skin color is noted with erythema which is circumferential. Periwound temperature was noted as No Abnormality. The periwound has tenderness on palpation. Assessment Active Problems ICD-10 E10.621 - Type 1 diabetes mellitus with foot ulcer L89.623 - Pressure ulcer of left heel, stage 3 Z92.21 - Personal history of antineoplastic chemotherapy Z79.52 - Long term (current) use of systemic steroids Procedures Wound #2 Wound #2 is a Pressure Ulcer located on the Left Calcaneus . There was a Skin/Subcutaneous Tissue Debridement [id number]) debridement with total area of 0.25 sq cm performed by Christin Fudge, MD. with the following instrument(s): Curette to remove Non-Viable tissue/material including Fibrin/Slough and Subcutaneous after achieving pain control using Lidocaine 4% Topical Solution. A time out was conducted at 13:49, prior to the start of the procedure. A Moderate amount of bleeding was controlled with Pressure. The procedure was tolerated  well with a pain level of 0 throughout and a pain level of 0 following the procedure. Post Debridement Measurements: 0.5cm length x 0.5cm width x 0.7cm depth; 0.137cm^3 volume. Post debridement Stage noted as Category/Stage III. Character of Wound/Ulcer Post Debridement requires further debridement. Severity of Tissue Post Debridement is: Fat layer exposed. Post procedure Diagnosis Wound #2: Same as Pre-Procedure Plan Wound Cleansing: Wound #2 Left Calcaneus: Cleanse wound with mild soap and water JAYLENA, MALLIK ([id number]) May Shower, gently pat wound dry prior to applying new dressing. Anesthetic: Wound #2 Left Calcaneus: Topical Lidocaine 4% cream applied to wound bed prior to debridement Primary Wound Dressing: Wound #2 Left Calcaneus: Santyl Ointment Medihoney gel - Use if you cannot afford SAntyl Secondary Dressing: Wound #2 Left Calcaneus: Gauze and Kerlix/Conform Dressing Change Frequency: Wound #2 Left Calcaneus: Change dressing every day. Follow-up Appointments: Wound #2 Left Calcaneus: Return Appointment in 1 week. Off-Loading: Wound #2 Left Calcaneus: Heel suspension boot to: - ELevate on pillows Additional Orders / Instructions: Wound #2 Left Calcaneus: Increase protein intake. Activity as tolerated Other: - INclude Vitamin A, C, ZInc, MVI in diet Medications-please add to medication list.: Wound #2 Left Calcaneus: Santyl Enzymatic Ointment I have recommended: 1. Off-Loading as much as possible and using proper footwear. 2. Santyl ointment to the wound packed  with plain packing gauze, and dressings to be changed daily 3. Continue good control of her diabetes mellitus 4. Adequate protein, vitamin C, zinc 5. X-ray of the left foot -- x-ray report reviewed with the patient and her husband 6. Regular visits to the wound center Electronic Signature(s) Signed: 01/07/2016 2:15:42 PM By: Christin Fudge MD, FACS Entered By: Christin Fudge on 01/07/2016  14:15:42 [name] ([id number]) -------------------------------------------------------------------------------- SuperBill Details Patient Name: [name] Date of Service: 01/07/2016 Medical Record Number: [id number] Patient Account Number: [id number] Date of Birth/Sex: [birth year] (70 y.o. Female) Treating RN: Montey Hora Primary Care Physician: Caryl Bis, ERIC Other Clinician: Referring Physician: Caryl Bis, ERIC Treating Physician/Extender: Frann Rider in Treatment: 1 Diagnosis Coding ICD-10 Codes Code Description E10.621 Type 1 diabetes mellitus with foot ulcer L89.623 Pressure ulcer of left heel, stage 3 Z92.21 Personal history of antineoplastic chemotherapy Z79.52 Long term (current) use of systemic steroids Facility Procedures CPT4 Code: [id number] Description: [id number] - DEB SUBQ TISSUE 20 SQ CM/< ICD-10 Description Diagnosis E10.621 Type 1 diabetes mellitus with foot ulcer L89.623 Pressure ulcer of left heel, stage 3 Z92.21 Personal history of antineoplastic chemotherap Z79.52 Long term (current)  use of systemic steroids Modifier: y Quantity: 1 Physician Procedures CPT4 Code: [id number] Description: 99213 - WC PHYS LEVEL 3 - EST PT ICD-10 Description Diagnosis E10.621 Type 1 diabetes mellitus with foot ulcer L89.623 Pressure ulcer of left heel, stage 3 Z92.21 Personal history of antineoplastic chemotherap Z79.52 Long term (current) use  of systemic steroids Modifier: 12 y Quantity: 1 CPT4 Code: [id number] Ludger Nutting Description: 11042 - WC PHYS SUBQ TISS 20 SQ CM ICD-10 Description Diagnosis E10.621 Type 1 diabetes mellitus with foot ulcer L89.623 Pressure ulcer of left heel, stage 3 Z92.21 Personal history of antineoplastic chemotherap Z79.52 Long term (current)  use of systemic steroids W. ([id number]) Modifier: y Quantity: 1 Electronic Signature(s) Signed: 01/07/2016 2:16:25 PM By: Christin Fudge MD, FACS Previous Signature: 01/07/2016 2:15:56 PM Version  By: Christin Fudge MD, FACS Entered By: Christin Fudge on 01/07/2016 14:16:25

## 2016-01-08 NOTE — Progress Notes (Signed)
LODEMA, BENDER ([id number]) Visit Report for 01/07/2016 Arrival Information Details Patient Name: [name] Date of Service: 01/07/2016 1:30 PM Medical Record Number: [id number] Patient Account Number: [id number] Date of Birth/Sex: [birth year] (70 y.o. Female) Treating RN: Montey Hora Primary Care Physician: Caryl Bis, ERIC Other Clinician: Referring Physician: Caryl Bis, ERIC Treating Physician/Extender: Frann Rider in Treatment: 1 Visit Information History Since Last Visit Added or deleted any medications: No Patient Arrived: Ambulatory Any new allergies or adverse reactions: No Arrival Time: 13:27 Had a fall or experienced change in No Accompanied By: spouse activities of daily living that may affect Transfer Assistance: None risk of falls: Patient Identification Verified: Yes Signs or symptoms of abuse/neglect since last No Secondary Verification Process Yes visito Completed: Hospitalized since last visit: No Patient Has Alerts: Yes Pain Present Now: No Patient Alerts: Patient on Blood Thinner lovenox BID DMII Electronic Signature(s) Signed: 01/07/2016 5:53:50 PM By: Montey Hora Entered By: Montey Hora on 01/07/2016 13:28:29 [name] ([id number]) -------------------------------------------------------------------------------- Encounter Discharge Information Details Patient Name: [name] Date of Service: 01/07/2016 1:30 PM Medical Record Number: [id number] Patient Account Number: [id number] Date of Birth/Sex: 03-13-1946 (70 y.o. Female) Treating RN: Montey Hora Primary Care Physician: Caryl Bis, ERIC Other Clinician: Referring Physician: Caryl Bis, ERIC Treating Physician/Extender: Frann Rider in Treatment: 1 Encounter Discharge Information Items Discharge Pain Level: 0 Discharge Condition: Stable Ambulatory Status: Ambulatory Discharge Destination: Home Transportation: Private Auto Accompanied By:  spouse Schedule Follow-up Appointment: Yes Medication Reconciliation completed and provided to Patient/Care No [name]: Provided on Clinical Summary of Care: 01/07/2016 Form Type Recipient Paper Patient SR Electronic Signature(s) Signed: 01/07/2016 3:19:36 PM By: Montey Hora Previous Signature: 01/07/2016 2:02:44 PM Version By: Ruthine Dose Entered By: Montey Hora on 01/07/2016 15:19:35 [name], [name] ([id number]) -------------------------------------------------------------------------------- Multi Wound Chart Details Patient Name: [name] Date of Service: 01/07/2016 1:30 PM Medical Record Number: [id number] Patient Account Number: [id number] Date of Birth/Sex: 26-Feb-1946 (70 y.o. Female) Treating RN: Baruch Gouty, RN, BSN, Velva Harman Primary Care Physician: Caryl Bis, ERIC Other Clinician: Referring Physician: Caryl Bis, ERIC Treating Physician/Extender: Frann Rider in Treatment: 1 Vital Signs Height(in): 63 Pulse(bpm): 83 Weight(lbs): 130 Blood Pressure 96/44 (mmHg): Body Mass Index(BMI): 23 Temperature(F): 98.0 Respiratory Rate 18 (breaths/min): Photos: [N/A:N/A] Wound Location: Left Calcaneus N/A N/A Wounding Event: Pressure Injury N/A N/A Primary Etiology: Pressure Ulcer N/A N/A Secondary Etiology: Diabetic Wound/Ulcer of N/A N/A the Lower Extremity Comorbid History: Cataracts, Type I N/A N/A Diabetes, Rheumatoid Arthritis, Osteoarthritis, Neuropathy, Received Chemotherapy Date Acquired: 07/15/2015 N/A N/A Weeks of Treatment: 1 N/A N/A Wound Status: Open N/A N/A Measurements L x W x D 0.5x0.5x0.4 N/A N/A (cm) Area (cm) : 0.196 N/A N/A Volume (cm) : 0.079 N/A N/A % Reduction in Area: 16.90% N/A N/A % Reduction in Volume: 16.00% N/A N/A Classification: Category/Stage III N/A N/A HBO Classification: Grade 1 N/A N/A Exudate Amount: Large N/A N/A Exudate Type: Serous N/A N/A [name], [name] ([id number]) Exudate Color: amber N/A N/A Wound  Margin: Flat and Intact N/A N/A Granulation Amount: None Present (0%) N/A N/A Necrotic Amount: Large (67-100%) N/A N/A Necrotic Tissue: Eschar, Adherent Slough N/A N/A Exposed Structures: Fascia: No N/A N/A Fat: No Tendon: No Muscle: No Joint: No Bone: No Limited to Skin Breakdown Epithelialization: None N/A N/A Periwound Skin Texture: Edema: No N/A N/A Excoriation: No Induration: No Callus: No Crepitus: No Fluctuance: No Friable: No Rash: No Scarring: No Periwound Skin Moist: Yes N/A N/A Moisture: Maceration: No Dry/Scaly: No Periwound Skin Color: Erythema: Yes N/A N/A Atrophie  Blanche: No Cyanosis: No Ecchymosis: No Hemosiderin Staining: No Mottled: No Pallor: No Rubor: No Erythema Location: Circumferential N/A N/A Temperature: No Abnormality N/A N/A Tenderness on Yes N/A N/A Palpation: Wound Preparation: Ulcer Cleansing: N/A N/A Rinsed/Irrigated with Saline Topical Anesthetic Applied: Other: lidocaine 4% Treatment Notes Electronic Signature(s) [name] ([id number]) Signed: 01/07/2016 5:31:48 PM By: Regan Lemming BSN, RN Entered By: Regan Lemming on 01/07/2016 13:49:04 [name] ([id number]) -------------------------------------------------------------------------------- Multi-Disciplinary Care Plan Details Patient Name: [name] Date of Service: 01/07/2016 1:30 PM Medical Record Number: [id number] Patient Account Number: [id number] Date of Birth/Sex: December 14, 1945 (70 y.o. Female) Treating RN: Afful, RN, BSN, [place] Primary Care Physician: Caryl Bis, ERIC Other Clinician: Referring Physician: Caryl Bis, ERIC Treating Physician/Extender: Frann Rider in Treatment: 1 Active Inactive Orientation to the Wound Care Program Nursing Diagnoses: Knowledge deficit related to the wound healing center program Goals: Patient/caregiver will verbalize understanding of the [place] Program Date Initiated: 12/31/2015 Goal Status:  Active Interventions: Provide education on orientation to the wound center Notes: Pressure Nursing Diagnoses: Knowledge deficit related to causes and risk factors for pressure ulcer development Knowledge deficit related to management of pressures ulcers Potential for impaired tissue integrity related to pressure, friction, moisture, and shear Goals: Patient will remain free from development of additional pressure ulcers Date Initiated: 12/31/2015 Goal Status: Active Patient will remain free of pressure ulcers Date Initiated: 12/31/2015 Goal Status: Active Patient/caregiver will verbalize risk factors for pressure ulcer development Date Initiated: 12/31/2015 Goal Status: Active Patient/caregiver will verbalize understanding of pressure ulcer management Date Initiated: 12/31/2015 Goal Status: Active Interventions: [name], [name] ([id number]) Assess: immobility, friction, shearing, incontinence upon admission and as needed Assess offloading mechanisms upon admission and as needed Assess potential for pressure ulcer upon admission and as needed Provide education on pressure ulcers Treatment Activities: Patient referred for pressure reduction/relief devices : 12/31/2015 Notes: Wound/Skin Impairment Nursing Diagnoses: Impaired tissue integrity Knowledge deficit related to smoking impact on wound healing Knowledge deficit related to ulceration/compromised skin integrity Goals: Patient/caregiver will verbalize understanding of skin care regimen Date Initiated: 12/31/2015 Goal Status: Active Ulcer/skin breakdown will have a volume reduction of 30% by week 4 Date Initiated: 12/31/2015 Goal Status: Active Ulcer/skin breakdown will have a volume reduction of 50% by week 8 Date Initiated: 12/31/2015 Goal Status: Active Ulcer/skin breakdown will have a volume reduction of 80% by week 12 Date Initiated: 12/31/2015 Goal Status: Active Ulcer/skin breakdown will heal within 14 weeks Date  Initiated: 12/31/2015 Goal Status: Active Interventions: Assess patient/caregiver ability to obtain necessary supplies Assess patient/caregiver ability to perform ulcer/skin care regimen upon admission and as needed Assess ulceration(s) every visit Provide education on ulcer and skin care Treatment Activities: Skin care regimen initiated : 12/31/2015 Topical wound management initiated : 12/31/2015 Notes: [name], [name] ([id number]) Electronic Signature(s) Signed: 01/07/2016 5:31:48 PM By: Regan Lemming BSN, RN Entered By: Regan Lemming on 01/07/2016 13:48:49 [name] ([id number]) -------------------------------------------------------------------------------- Pain Assessment Details Patient Name: [name] Date of Service: 01/07/2016 1:30 PM Medical Record Number: [id number] Patient Account Number: [id number] Date of Birth/Sex: Mar 15, 1946 (70 y.o. Female) Treating RN: Montey Hora Primary Care Physician: Caryl Bis, ERIC Other Clinician: Referring Physician: Caryl Bis, ERIC Treating Physician/Extender: Frann Rider in Treatment: 1 Active Problems Location of Pain Severity and Description of Pain Patient Has Paino No Site Locations Pain Management and Medication Current Pain Management: Notes Topical or injectable lidocaine is offered to patient for acute pain when surgical debridement is performed. If needed, Patient is instructed to use over  the counter pain medication for the following 24-48 hours after debridement. Wound care MDs do not prescribed pain medications. Patient has chronic pain or uncontrolled pain. Patient has been instructed to make an appointment with their Primary Care Physician for pain management. Electronic Signature(s) Signed: 01/07/2016 5:53:50 PM By: Montey Hora Entered By: Montey Hora on 01/07/2016 13:30:42 [name]  ([id number]) -------------------------------------------------------------------------------- Patient/Caregiver Education Details Patient Name: [name] Date of Service: 01/07/2016 1:30 PM Medical Record Number: [id number] Patient Account Number: [id number] Date of Birth/Gender: [birth year] (70 y.o. Female) Treating RN: Montey Hora Primary Care Physician: Caryl Bis, ERIC Other Clinician: Referring Physician: Caryl Bis, ERIC Treating Physician/Extender: Frann Rider in Treatment: 1 Education Assessment Education Provided To: Patient and Caregiver Education Topics Provided Wound/Skin Impairment: Handouts: Other: wound care to continue as ordered Methods: Demonstration, Explain/Verbal Responses: State content correctly Electronic Signature(s) Signed: 01/07/2016 5:53:50 PM By: Montey Hora Entered By: Montey Hora on 01/07/2016 13:42:29 [name] ([id number]) -------------------------------------------------------------------------------- Wound Assessment Details Patient Name: [name] Date of Service: 01/07/2016 1:30 PM Medical Record Number: [id number] Patient Account Number: [id number] Date of Birth/Sex: April 19, 1946 (70 y.o. Female) Treating RN: Montey Hora Primary Care Physician: Caryl Bis, ERIC Other Clinician: Referring Physician: Caryl Bis, ERIC Treating Physician/Extender: Frann Rider in Treatment: 1 Wound Status Wound Number: 2 Primary Pressure Ulcer Etiology: Wound Location: Left Calcaneus Secondary Diabetic Wound/Ulcer of the Lower Wounding Event: Pressure Injury Etiology: Extremity Date Acquired: 07/15/2015 Wound Open Weeks Of Treatment: 1 Status: Clustered Wound: No Comorbid Cataracts, Type I Diabetes, History: Rheumatoid Arthritis, Osteoarthritis, Neuropathy, Received Chemotherapy Photos Wound Measurements Length: (cm) 0.5 Width: (cm) 0.5 Depth: (cm) 0.4 Area: (cm) 0.196 Volume: (cm) 0.079 % Reduction in  Area: 16.9% % Reduction in Volume: 16% Epithelialization: None Tunneling: No Undermining: No Wound Description Classification: Category/Stage III Diabetic Severity [name]): Grade 1 Wound Margin: Flat and Intact Exudate Amount: Large Exudate Type: Serous Exudate Color: amber Foul Odor After Cleansing: No Wound Bed Granulation Amount: None Present (0%) Exposed Structure Necrotic Amount: Large (67-100%) Fascia Exposed: No [name], [name] ([id number]) Necrotic Quality: Eschar, Adherent Slough Fat Layer Exposed: No Tendon Exposed: No Muscle Exposed: No Joint Exposed: No Bone Exposed: No Limited to Skin Breakdown Periwound Skin Texture Texture Color No Abnormalities Noted: No No Abnormalities Noted: No Callus: No Atrophie Blanche: No Crepitus: No Cyanosis: No Excoriation: No Ecchymosis: No Fluctuance: No Erythema: Yes Friable: No Erythema Location: Circumferential Induration: No Hemosiderin Staining: No Localized Edema: No Mottled: No Rash: No Pallor: No Scarring: No Rubor: No Moisture Temperature / Pain No Abnormalities Noted: No Temperature: No Abnormality Dry / Scaly: No Tenderness on Palpation: Yes Maceration: No Moist: Yes Wound Preparation Ulcer Cleansing: Rinsed/Irrigated with Saline Topical Anesthetic Applied: Other: lidocaine 4%, Treatment Notes Wound #2 (Left Calcaneus) 1. Cleansed with: Clean wound with Normal Saline 2. Anesthetic Topical Lidocaine 4% cream to wound bed prior to debridement 4. Dressing Applied: Santyl Ointment Plain packing gauze 5. Secondary Dressing Applied Dry Gauze 7. Secured with Tape Notes netting Electronic Signature(s) [name], [name] ([id number]) Signed: 01/07/2016 5:53:50 PM By: Montey Hora Entered By: Montey Hora on 01/07/2016 13:37:47 [name], [name] ([id number]) -------------------------------------------------------------------------------- Vitals Details Patient Name: [name] Date  of Service: 01/07/2016 1:30 PM Medical Record Number: [id number] Patient Account Number: [id number] Date of Birth/Sex: [birth year] (70 y.o. Female) Treating RN: Montey Hora Primary Care Physician: Caryl Bis, ERIC Other Clinician: Referring Physician: Caryl Bis, ERIC Treating Physician/Extender: Frann Rider in Treatment: 1 Vital Signs Time Taken: 13:30 Temperature (F): 98.0 Height (in): 63 Pulse (  bpm): 83 Weight (lbs): 130 Respiratory Rate (breaths/min): 18 Body Mass Index (BMI): 23 Blood Pressure (mmHg): 96/44 Reference Range: 80 - 120 mg / dl Electronic Signature(s) Signed: 01/07/2016 5:53:50 PM By: Montey Hora Entered By: Montey Hora on 01/07/2016 13:32:51

## 2016-01-11 ENCOUNTER — Ambulatory Visit (INDEPENDENT_AMBULATORY_CARE_PROVIDER_SITE_OTHER): Payer: Medicare Other

## 2016-01-11 VITALS — BP 118/68 | HR 78 | Temp 98.1°F | Resp 12 | Ht 63.0 in | Wt 132.4 lb

## 2016-01-11 DIAGNOSIS — Z1239 Encounter for other screening for malignant neoplasm of breast: Secondary | ICD-10-CM | POA: Diagnosis not present

## 2016-01-11 DIAGNOSIS — Z0000 Encounter for general adult medical examination without abnormal findings: Secondary | ICD-10-CM | POA: Diagnosis not present

## 2016-01-11 NOTE — Patient Instructions (Addendum)
[name] , Thank you for taking time to come for your Medicare Wellness Visit. I appreciate your ongoing commitment to your health goals. Please review the following plan we discussed and let me know if I can assist you in the future.   These are the goals we discussed: Goals    . Healthy Lifestyle          Stay hydrated and continue drinking plenty of fluids. Healthy/diabetic diet.    Stay active and continue walking for exercise as tolerated.        This is a list of the screening recommended for you and due dates:  Health Maintenance  Topic Date Due  .  Hepatitis C: One time screening is recommended by Center for Disease Control  (CDC) for  adults born from [birth year] through 1967.   1946/01/03  . Mammogram  12/11/2015  . Flu Shot  07/30/2016*  . Shingles Vaccine  01/10/2017*  . Tetanus Vaccine  01/10/2017*  . Hemoglobin A1C  06/08/2016  . Complete foot exam   12/06/2016  . Eye exam for diabetics  12/07/2016  . Colon Cancer Screening  10/11/2021  . DEXA scan (bone density measurement)  Completed  . Pneumonia vaccines  Completed  *Topic was postponed. The date shown is not the original due date.      Hearing Loss Hearing loss is a partial or total loss of the ability to hear. This can be temporary or permanent, and it can happen in one or both ears. Hearing loss may be referred to as deafness. Medical care is necessary to treat hearing loss properly and to prevent the condition from getting worse. Your hearing may partially or completely come back, depending on what caused your hearing loss and how severe it is. In some cases, hearing loss is permanent. CAUSES Common causes of hearing loss include:   Too much wax in the ear canal.   Infection of the ear canal or middle ear.   Fluid in the middle ear.   Injury to the ear or surrounding area.   An object stuck in the ear.   Prolonged exposure to loud sounds, such as music.  Less common causes of hearing loss  include:   Tumors in the ear.   Viral or bacterial infections, such as meningitis.   A hole in the eardrum (perforated eardrum).  Problems with the hearing nerve that sends signals between the brain and the ear.  Certain medicines.  SYMPTOMS  Symptoms of this condition may include:  Difficulty telling the difference between sounds.  Difficulty following a conversation when there is background noise.  Lack of response to sounds in your environment. This may be most noticeable when you do not respond to startling sounds.  Needing to turn up the volume on the television, radio, etc.  Ringing in the ears.  Dizziness.  Pain in the ears. DIAGNOSIS This condition is diagnosed based on a physical exam and a hearing test (audiometry). The audiometry test will be performed by a hearing specialist (audiologist). You may also be referred to an ear, nose, and throat (ENT) specialist (otolaryngologist).  TREATMENT Treatment for recent onset of hearing loss may include:   Ear wax removal.   Being prescribed medicines to prevent infection (antibiotics).   Being prescribed medicines to reduce inflammation (corticosteroids).  HOME CARE INSTRUCTIONS  If you were prescribed an antibiotic medicine, take it as told by your health care provider. Do not stop taking the antibiotic even if you start to  feel better.  Take over-the-counter and prescription medicines only as told by your health care provider.  Avoid loud noises.   Return to your normal activities as told by your health care provider. Ask your health care provider what activities are safe for you.  Keep all follow-up visits as told by your health care provider. This is important. SEEK MEDICAL CARE IF:   You feel dizzy.   You develop new symptoms.   You vomit or feel nauseous.   You have a fever.  SEEK IMMEDIATE MEDICAL CARE IF:  You develop sudden changes in your vision.   You have severe ear pain.   You  have new or increased weakness.  You have a severe headache.   This information is not intended to replace advice given to you by your health care provider. Make sure you discuss any questions you have with your health care provider.   Document Released: 04/18/2005 Document Revised: 01/07/2015 Document Reviewed: 09/03/2014 Elsevier Interactive Patient Education 2016 [place] A mammogram is an X-ray of the breasts that is done to check for abnormal changes. This procedure can screen for and detect any changes that may suggest breast cancer. A mammogram can also identify other changes and variations in the breast, such as:  Inflammation of the breast tissue (mastitis).  An infected area that contains a collection of pus (abscess).  A fluid-filled sac (cyst).  Fibrocystic changes. This is when breast tissue becomes denser, which can make the tissue feel rope-like or uneven under the skin.  Tumors that are not cancerous (benign). LET [facility] CARE PROVIDER KNOW ABOUT:  Any allergies you have.  If you have breast implants.  If you have had previous breast disease, biopsy, or surgery.  If you are breastfeeding.  Any possibility that you could be pregnant, if this applies.  If you are younger than age 54.  If you have a family history of breast cancer. RISKS AND COMPLICATIONS Generally, this is a safe procedure. However, problems may occur, including:  Exposure to radiation. Radiation levels are very low with this test.  The results being misinterpreted.  The need for further tests.  The inability of the mammogram to detect certain cancers. BEFORE THE PROCEDURE  Schedule your test about 1-2 weeks after your menstrual period. This is usually when your breasts are the least tender.  If you have had a mammogram done at a different facility in the past, get the mammogram X-rays or have them sent to your current exam facility in order to compare them.  Wash  your breasts and under your arms the day of the test.  Do not wear deodorants, perfumes, lotions, or powders anywhere on your body on the day of the test.  Remove any jewelry from your neck.  Wear clothes that you can change into and out of easily. PROCEDURE  You will undress from the waist up and put on a gown.  You will stand in front of the X-ray machine.  Each breast will be placed between two plastic or glass plates. The plates will compress your breast for a few seconds. Try to stay as relaxed as possible during the procedure. This does not cause any harm to your breasts and any discomfort you feel will be very brief.  X-rays will be taken from different angles of each breast. The procedure may vary among health care providers and hospitals. AFTER THE PROCEDURE  The mammogram will be examined by a specialist (radiologist).  You  may need to repeat certain parts of the test, depending on the quality of the images. This is commonly done if the radiologist needs a better view of the breast tissue.  Ask when your test results will be ready. Make sure you get your test results.  You may resume your normal activities.   This information is not intended to replace advice given to you by your health care provider. Make sure you discuss any questions you have with your health care provider.   Document Released: 04/15/2000 Document Revised: 01/07/2015 Document Reviewed: 06/27/2014 Elsevier Interactive Patient Education [employer].

## 2016-01-11 NOTE — Progress Notes (Signed)
Subjective:   [name] is a 70 y.o. female who presents for Medicare Annual (Subsequent) preventive examination.  Review of Systems:  No ROS.  Medicare Wellness Visit.  Cardiac Risk Factors include: advanced age (>32mn, >>41women);diabetes mellitus;hypertension     Objective:     Vitals: BP 118/68 (BP Location: Left Arm, Patient Position: Sitting, Cuff Size: Normal)   Pulse 78   Temp 98.1 F (36.7 C) (Oral)   Resp 12   Ht 5' 3" (1.6 m)   Wt 132 lb 6.4 oz (60.1 kg)   SpO2 98%   BMI 23.45 kg/m   Body mass index is 23.45 kg/m.   Tobacco History  Smoking Status  . Former Smoker  . Packs/day: 1.00  . Years: 30.00  . Types: Cigarettes  . Quit date: 06/11/1994  Smokeless Tobacco  . Never Used     Counseling given: Not Answered   Past Medical History:  Diagnosis Date  . CAD (coronary artery disease)    a. 09/2012 Cath: LM nl, LAD 95p, 734mLCX 9028mM2 50, RCA 100.  . CMarland Kitchenolelithiasis   . Chronic diastolic CHF (congestive heart failure) ([place] . Collagen vascular disease ([place] . Esophageal stricture   . Exertional shortness of breath   . GERD (gastroesophageal reflux disease)   . H/O hiatal hernia   . Herniated disc   . History of pancytopenia   . Hyperlipidemia   . Hypertension   . Hypokalemia   . Leukopenia 2012   s/p bone marrow biopsy, Dr. PanMa Hillock NSTEMI (non-ST elevated myocardial infarction) ([place]/2014   "mild" (10/18/2012)  . Ovarian cancer ([place] . Pneumonia 2013; 08/2012   "one lung; double" (10/18/2012)  . Rheumatoid arthritis(714.0)   . Type I diabetes mellitus ([facility]  "dx'd in 1957" (10/18/2012)   Past Surgical History:  Procedure Laterality Date  . ABDOMINAL HYSTERECTOMY  06/15/2015   Dx L/S, EXLAP TAH BSO omentectomy RSRx colostomy diaphragm resection stripping  . CARDIAC CATHETERIZATION  10/18/2012   "first one was today" (10/18/2012)  . CATARACT EXTRACTION W/ INTRAOCULAR LENS IMPLANT Right 2010  . CHOLECYSTECTOMY  06/15/2015   combined case with ovarian cancer debulking  . COLON SURGERY    . CORONARY ARTERY BYPASS GRAFT N/A 10/19/2012   Procedure: CORONARY ARTERY BYPASS GRAFTING (CABG);  Surgeon: SteMelrose NakayamaD;  [facility];  Laterality: N/A;  . ESOPHAGEAL DILATION     "3 or 4 times" (10/19/2011)  . ESOPHAGOGASTRODUODENOSCOPY  2012   Dr. IftMinna Merritts OSTOMY    . OVARY SURGERY     removal  . PERIPHERAL VASCULAR CATHETERIZATION N/A 03/02/2015   Procedure: PorGlori Luisth Insertion;  Surgeon: JasAlgernon HuxleyD;  [facility];  Service: Cardiovascular;  Laterality: N/A;  . TUBAL LIGATION  1970   Family History  Problem Relation Age of Onset  . Diabetes Mother   . Arthritis Mother   . Diabetes Father   . Arthritis Father   . Bone cancer Sister    History  Sexual Activity  . Sexual activity: No    Outpatient Encounter Prescriptions as of 01/11/2016  Medication Sig  . Calcium-Vitamin D 600-200 MG-UNIT tablet Take 2 tablets by mouth daily.   . eMarland Kitchenoxaparin (LOVENOX) 60 MG/0.6ML injection Inject 60 mg into the skin. Held last pm and this am dose for bone marrow biopsy  . enoxaparin (LOVENOX) 60 MG/0.6ML injection INJECT 0.6  MLS INTO THE SKIN EVERY 12 HOURS  . folic acid (FOLVITE) 1 MG tablet TAKE 1 TABLET BY MOUTH ONCE A DAY  . furosemide (LASIX) 80 MG tablet Take 80 mg by mouth daily as needed.   . Insulin Human (INSULIN PUMP) SOLN Pt uses Humalog insulin.  Marland Kitchen insulin lispro (HUMALOG) 100 UNIT/ML injection Inject 0.06 mLs (6 Units total) into the skin daily.  Marland Kitchen lidocaine-prilocaine (EMLA) cream Apply 1 application topically as needed (prior to accessing port).  . loratadine (CLARITIN) 10 MG tablet Take 1 tablet by mouth daily as needed.  Marland Kitchen losartan (COZAAR) 100 MG tablet TAKE 1 TABLET BY MOUTH ONCE A DAY  . methotrexate (RHEUMATREX) 2.5 MG tablet Take by mouth.  . metoprolol tartrate (LOPRESSOR) 25 MG tablet TAKE 1 TABLET BY MOUTH TWICE A DAY AS NEEDED  .  ondansetron (ZOFRAN) 8 MG tablet Take 1 tablet (8 mg total) by mouth every 8 (eight) hours as needed for nausea or vomiting.  . ONE TOUCH ULTRA TEST test strip   . oxyCODONE (OXY IR/ROXICODONE) 5 MG immediate release tablet Take 5 mg by mouth every 6 (six) hours as needed. Reported on 11/17/2015  . pantoprazole (PROTONIX) 40 MG tablet TAKE 1 TABLET BY MOUTH TWICE A DAY  . potassium chloride SA (K-DUR,KLOR-CON) 20 MEQ tablet Take 2 tablets (40 mEq total) by mouth daily.  Marland Kitchen POTASSIUM PO Take by mouth daily.  . predniSONE (DELTASONE) 5 MG tablet Take 5 mg by mouth as needed. Reported on 09/17/2015  . ranitidine (ZANTAC) 150 MG tablet Take 1 tablet (150 mg total) by mouth 2 (two) times daily. (Patient taking differently: Take 150 mg by mouth 2 (two) times daily as needed. )   Facility-Administered Encounter Medications as of 01/11/2016  Medication  . sodium chloride 0.9 % injection 10 mL  . sodium chloride 0.9 % injection 10 mL    Activities of Daily Living In your present state of health, do you have any difficulty performing the following activities: 01/11/2016 07/07/2015  Hearing? Tempie Donning  Vision? N N  Difficulty concentrating or making decisions? Y N  Walking or climbing stairs? Y Y  Dressing or bathing? N Y  Doing errands, shopping? Y N  Preparing Food and eating ? Y -  Using the Toilet? N -  In the past six months, have you accidently leaked urine? N -  Do you have problems with loss of bowel control? N -  Managing your Medications? Y -  Managing your Finances? Y -  Housekeeping or managing your Housekeeping? Y -  Some recent data might be hidden    Patient Care Team: Leone Haven, MD as PCP - General (Family Medicine) Clent Jacks, RN as Registered Nurse Julieanne Manson Leeanne Mannan., MD (Rheumatology) Judi Cong, MD as Physician Assistant (Internal Medicine) [facility] Gaetana Michaelis, MD as Referring Physician (Obstetrics and Gynecology) Mellody Drown, MD as Referring Physician  (Obstetrics and Gynecology) Minna Merritts, MD as Consulting Physician (Cardiology)    Assessment:    This is a routine wellness examination for [name]. The goal of the wellness visit is to assist the patient how to close the gaps in care and create a preventative care plan for the patient.   Taking calcium VIT D as appropriate/Osteoporosis reviewed.  Medications reviewed; taking without issues or barriers.  Safety issues reviewed; lives with husband.  Alarm system with smoke and carbon monoxide detectors in the home.Firearms locked in a secure area in the home. Wears seatbelts when  riding with others. No violence in the home.  No identified risk were noted; The patient was oriented x 3; appropriate in dress and manner and no objective failures at ADL's or IADL's.   Body mass index; normal.  Discussed the importance of a healthy diet, water intake and exercise. She does have a healthy diet, adequate water intake and walks for exercise.  Educational material provided.  Mammogram scheduled.    Influenza, TDAP, ZOSTAVAX vaccine deferred d/t compromised immune system. Cancer, low WBC.  Type 2 diabetes mellitus with oth circulatory comp-stable and followed by Betsey Holiday. Solum, MD.    Endocrinology; visits every 3 months.  Stable and followed by Dr. Gabriel Carina.  L heel ulcer; stale and followed by [facility] wound care [facility].  Angina pectoris, unspecified-stable and followed by Dr. Rockey Situ.    Patient Concerns: None at this time. Follow up with PCP as needed.  Exercise Activities and Dietary recommendations Current Exercise Habits: Home exercise routine, Type of exercise: walking (Walks laps around the house daily.), Time (Minutes): 20, Frequency (Times/Week): 3, Weekly Exercise (Minutes/Week): 60, Intensity: Mild  Goals    . Healthy Lifestyle          Stay hydrated and continue drinking plenty of fluids. Healthy/diabetic diet.    Stay active and continue walking for exercise as  tolerated.       Fall Risk Fall Risk  01/11/2016 06/12/2014 09/20/2013  Falls in the past year? No No Yes  Number falls in past yr: - - 1  Injury with Fall? - - No   Depression Screen PHQ 2/9 Scores 01/11/2016 06/12/2014 09/20/2013 03/12/2012  PHQ - 2 Score 0 0 0 0     Cognitive Testing MMSE - Mini Mental State Exam 01/11/2016  Orientation to time 5  Orientation to Place 5  Registration 3  Attention/ Calculation 5  Recall 3  Language- name 2 objects 2  Language- repeat 1  Language- follow 3 step command 3  Language- read & follow direction 1  Write a sentence 1  Copy design 1  Total score 30    Immunization History  Administered Date(s) Administered  . Influenza Split 03/11/2011, 02/24/2012, 02/19/2014  . Influenza Whole 02/19/2013  . Influenza-Unspecified 02/27/2015  . Pneumococcal Conjugate-13 09/20/2013  . Pneumococcal Polysaccharide-23 02/06/2011, 09/27/2012   Screening Tests Health Maintenance  Topic Date Due  . Hepatitis C Screening  1945-11-03  . MAMMOGRAM  12/11/2015  . INFLUENZA VACCINE  07/30/2016 (Originally 12/01/2015)  . ZOSTAVAX  01/10/2017 (Originally 12/13/2003)  . TETANUS/TDAP  01/10/2017 (Originally 12/13/1962)  . HEMOGLOBIN A1C  06/08/2016  . FOOT EXAM  12/06/2016  . OPHTHALMOLOGY EXAM  12/07/2016  . COLONOSCOPY  10/11/2021  . DEXA SCAN  Completed  . PNA vac Low Risk Adult  Completed      Plan:   End of life planning; Advance aging; Advanced directives discussed. Copy of current HCPOA/Living Will requested.  During the course of the visit the patient was educated and counseled about the following appropriate screening and preventive services:   Vaccines to include Pneumoccal, Influenza, Hepatitis B, Td, Zostavax, HCV  Electrocardiogram  Cardiovascular Disease  Colorectal cancer screening  Bone density screening  Diabetes screening  Glaucoma screening  Mammography/PAP  Nutrition counseling   Patient Instructions (the written plan)  was given to the patient.   Varney Biles, LPN  [id number]/[id number]

## 2016-01-12 ENCOUNTER — Ambulatory Visit (INDEPENDENT_AMBULATORY_CARE_PROVIDER_SITE_OTHER): Payer: Medicare Other | Admitting: Gastroenterology

## 2016-01-12 ENCOUNTER — Encounter: Payer: Self-pay | Admitting: Gastroenterology

## 2016-01-12 ENCOUNTER — Other Ambulatory Visit: Payer: Self-pay

## 2016-01-12 VITALS — BP 118/54 | HR 79 | Temp 98.1°F | Ht 63.0 in | Wt 133.8 lb

## 2016-01-12 DIAGNOSIS — R748 Abnormal levels of other serum enzymes: Secondary | ICD-10-CM | POA: Diagnosis not present

## 2016-01-12 NOTE — Progress Notes (Signed)
Primary Care Physician: Tommi Rumps, MD  Primary Gastroenterologist:  Dr. Lucilla Lame  Chief Complaint  Patient presents with  . Follow up MRI results    HPI: [name] is a 70 y.o. female here for follow-up of her MRI and to review the results. The patient  Had an MRI that showed no obstructive lesion although the common bile duct was dilated.  The patient's MRI was suggested that possibly an ampullary lesion versus a distal stricture could be the cause.  The patient is more concerned about her leukopenia.  Current Outpatient Prescriptions  Medication Sig Dispense Refill  . Calcium-Vitamin D 600-200 MG-UNIT tablet Take 2 tablets by mouth daily.     Marland Kitchen enoxaparin (LOVENOX) 60 MG/0.6ML injection Inject 60 mg into the skin. Held last pm and this am dose for bone marrow biopsy    . enoxaparin (LOVENOX) 60 MG/0.6ML injection INJECT 0.6 MLS INTO THE SKIN EVERY 12 HOURS 60 Syringe 1  . folic acid (FOLVITE) 1 MG tablet TAKE 1 TABLET BY MOUTH ONCE A DAY 90 tablet 3  . furosemide (LASIX) 80 MG tablet Take 80 mg by mouth daily as needed.     . Insulin Human (INSULIN PUMP) SOLN Pt uses Humalog insulin.    Marland Kitchen insulin lispro (HUMALOG) 100 UNIT/ML injection Inject 0.06 mLs (6 Units total) into the skin daily. 30 mL 3  . lidocaine-prilocaine (EMLA) cream Apply 1 application topically as needed (prior to accessing port).    . loratadine (CLARITIN) 10 MG tablet Take 1 tablet by mouth daily as needed.    Marland Kitchen losartan (COZAAR) 100 MG tablet TAKE 1 TABLET BY MOUTH ONCE A DAY 90 tablet 1  . methotrexate (RHEUMATREX) 2.5 MG tablet Take by mouth.    . metoprolol tartrate (LOPRESSOR) 25 MG tablet TAKE 1 TABLET BY MOUTH TWICE A DAY AS NEEDED 180 tablet 3  . ondansetron (ZOFRAN) 8 MG tablet Take 1 tablet (8 mg total) by mouth every 8 (eight) hours as needed for nausea or vomiting. 20 tablet 1  . ONE TOUCH ULTRA TEST test strip     . oxyCODONE (OXY IR/ROXICODONE) 5 MG immediate release tablet Take 5 mg by  mouth every 6 (six) hours as needed. Reported on 11/17/2015    . pantoprazole (PROTONIX) 40 MG tablet TAKE 1 TABLET BY MOUTH TWICE A DAY 180 tablet 1  . potassium chloride SA (K-DUR,KLOR-CON) 20 MEQ tablet Take 2 tablets (40 mEq total) by mouth daily. 60 tablet 3  . POTASSIUM PO Take by mouth daily.    . predniSONE (DELTASONE) 5 MG tablet Take 5 mg by mouth as needed. Reported on 09/17/2015    . ranitidine (ZANTAC) 150 MG tablet Take 1 tablet (150 mg total) by mouth 2 (two) times daily. (Patient taking differently: Take 150 mg by mouth 2 (two) times daily as needed. ) 180 tablet 2   No current facility-administered medications for this visit.    Facility-Administered Medications Ordered in Other Visits  Medication Dose Route Frequency Provider Last Rate Last Dose  . sodium chloride 0.9 % injection 10 mL  10 mL Intracatheter PRN Lequita Asal, MD      . sodium chloride 0.9 % injection 10 mL  10 mL Intravenous PRN Lequita Asal, MD   10 mL at 04/13/15 0852    Allergies as of 01/12/2016 - Review Complete 01/12/2016  Allergen Reaction Noted  . Codeine Nausea And Vomiting 01/10/2011  . Latex Rash 11/17/2015    ROS:  General: Negative for anorexia, weight loss, fever, chills, fatigue, weakness. ENT: Negative for hoarseness, difficulty swallowing , nasal congestion. CV: Negative for chest pain, angina, palpitations, dyspnea on exertion, peripheral edema.  Respiratory: Negative for dyspnea at rest, dyspnea on exertion, cough, sputum, wheezing.  GI: See history of present illness. GU:  Negative for dysuria, hematuria, urinary incontinence, urinary frequency, nocturnal urination.  Endo: Negative for unusual weight change.    Physical Examination:   BP (!) 118/54   Pulse 79   Temp 98.1 F (36.7 C) (Oral)   Ht 5' 3"  (1.6 m)   Wt 133 lb 12.8 oz (60.7 kg)   BMI 23.70 kg/m   General: Well-nourished, well-developed in no acute distress.  Eyes: No icterus. Conjunctivae pink. Neuro:  Alert and oriented x 3.  Grossly intact. Skin: Warm and dry, no jaundice.   Psych: Alert and cooperative, normal mood and affect.  Labs:    Imaging Studies: Dg Os Calcis Left  Result Date: 12/31/2015 CLINICAL DATA:  Nonhealing wound LEFT lower extremity at heel, type I diabetes mellitus, hypertension, chronic diastolic CHF, coronary artery disease, ovarian cancer, collagen vascular disease EXAM: LEFT OS CALCIS - 2+ VIEW COMPARISON:  LEFT foot radiographs 10/16/2013 FINDINGS: Scattered small vessel vascular calcification. Osseous mineralization normal. Joint spaces preserved. No fracture, dislocation or bone destruction. No soft tissue gas. Dressing artifacts overlying dorsal aspect of LEFT heel. IMPRESSION: No radiographic evidence of osteomyelitis. If this remains a clinical concern, recommend MR imaging. Electronically Signed   By: Lavonia Dana M.D.   On: 12/31/2015 12:17   Mr 3d Recon At Scanner  Result Date: 12/14/2015 CLINICAL DATA:  Enlargement of the bile duct identified on ultrasound. Patient status post cholecystectomy. Additional history of ovarian cancer and colostomy. Prior hepatic abscess and empyema. EXAM: MRI ABDOMEN WITHOUT AND WITH CONTRAST (INCLUDING MRCP) TECHNIQUE: Multiplanar multisequence MR imaging of the abdomen was performed both before and after the administration of intravenous contrast. Heavily T2-weighted images of the biliary and pancreatic ducts were obtained, and three-dimensional MRCP images were rendered by post processing. CONTRAST:  68m MULTIHANCE GADOBENATE DIMEGLUMINE 529 MG/ML IV SOLN COMPARISON:  Ultrasound 11/17/2015, CT thorax 07/29/2015. FINDINGS: Lower chest: Atelectasis at the RIGHT lung base with small effusion. No organized fluid collection Hepatobiliary: Interval resolution of the RIGHT hepatic lobe abscess. No focal hepatic lesion. No enhancing hepatic lesion. There is no intrahepatic biliary duct dilatation. There is fusiform dilatation of the common  hepatic duct and common bile duct. Duct dilatation is mild-to-moderate measuring 11 mm in the common hepatic duct and 9 mm in the common bile duct. No distinct filling defect within the common bile duct or external compression identified. Ductal dilatation extends to the level of the ampulla. Postcholecystectomy Pancreas: No pancreatic ductal dilatation. No variant pancreatic ductal anatomy. No pancreatic inflammation. Small cystic lesion adjacent dorsal aspect of the distal pancreas (image 5, series 8) appears to communicate with a accessory duct of the uncinate (image 5, series 8). Spleen: Normal spleen. Adrenals/urinary tract: Adrenal glands and kidneys are normal. Stomach/Bowel: Stomach and limited of the small bowel is unremarkable Vascular/Lymphatic: Abdominal aortic normal caliber. No retroperitoneal periportal lymphadenopathy. Musculoskeletal: No aggressive osseous lesion IMPRESSION: 1. Mild extrahepatic biliary duct dilatation without obstructing lesion identified. Dilatation extends to the level of the ampulla without obstructing lesion identified. Cannot exclude small ampullary lesion or distal stricture. 2. Normal pancreas without duct dilatation. 3. Small cystic lesion along the dorsal surface of the pancreas may communicate with an accessory pancreatic duct. Differential includes  postinflammatory cystic change versus side branch intraductal papillary mucinous tumor. Recommend single follow-up MRI in 12 months. This recommendation follows ACR consensus guidelines: Managing Incidental Findings on Abdominal CT: White Paper of the ACR Incidental Findings Committee. J Am Coll Radiol 2010;7:754-773 4. Resolution of hepatic abscess. Electronically Signed   By: Suzy Bouchard M.D.   On: 12/14/2015 10:21   Mr Jeananne Rama W/wo Cm/mrcp  Result Date: 12/14/2015 CLINICAL DATA:  Enlargement of the bile duct identified on ultrasound. Patient status post cholecystectomy. Additional history of ovarian cancer and  colostomy. Prior hepatic abscess and empyema. EXAM: MRI ABDOMEN WITHOUT AND WITH CONTRAST (INCLUDING MRCP) TECHNIQUE: Multiplanar multisequence MR imaging of the abdomen was performed both before and after the administration of intravenous contrast. Heavily T2-weighted images of the biliary and pancreatic ducts were obtained, and three-dimensional MRCP images were rendered by post processing. CONTRAST:  36m MULTIHANCE GADOBENATE DIMEGLUMINE 529 MG/ML IV SOLN COMPARISON:  Ultrasound 11/17/2015, CT thorax 07/29/2015. FINDINGS: Lower chest: Atelectasis at the RIGHT lung base with small effusion. No organized fluid collection Hepatobiliary: Interval resolution of the RIGHT hepatic lobe abscess. No focal hepatic lesion. No enhancing hepatic lesion. There is no intrahepatic biliary duct dilatation. There is fusiform dilatation of the common hepatic duct and common bile duct. Duct dilatation is mild-to-moderate measuring 11 mm in the common hepatic duct and 9 mm in the common bile duct. No distinct filling defect within the common bile duct or external compression identified. Ductal dilatation extends to the level of the ampulla. Postcholecystectomy Pancreas: No pancreatic ductal dilatation. No variant pancreatic ductal anatomy. No pancreatic inflammation. Small cystic lesion adjacent dorsal aspect of the distal pancreas (image 5, series 8) appears to communicate with a accessory duct of the uncinate (image 5, series 8). Spleen: Normal spleen. Adrenals/urinary tract: Adrenal glands and kidneys are normal. Stomach/Bowel: Stomach and limited of the small bowel is unremarkable Vascular/Lymphatic: Abdominal aortic normal caliber. No retroperitoneal periportal lymphadenopathy. Musculoskeletal: No aggressive osseous lesion IMPRESSION: 1. Mild extrahepatic biliary duct dilatation without obstructing lesion identified. Dilatation extends to the level of the ampulla without obstructing lesion identified. Cannot exclude small  ampullary lesion or distal stricture. 2. Normal pancreas without duct dilatation. 3. Small cystic lesion along the dorsal surface of the pancreas may communicate with an accessory pancreatic duct. Differential includes postinflammatory cystic change versus side branch intraductal papillary mucinous tumor. Recommend single follow-up MRI in 12 months. This recommendation follows ACR consensus guidelines: Managing Incidental Findings on Abdominal CT: White Paper of the ACR Incidental Findings Committee. J Am Coll Radiol 2010;7:754-773 4. Resolution of hepatic abscess. Electronically Signed   By: SSuzy BouchardM.D.   On: 12/14/2015 10:21    Assessment and Plan:   Janet BINNINGis a 70y.o. y/o female who comes in for follow-up of her MRI. The patient's MRCP did not show any obstructing lesion although a ampullary lesion could not be ruled out.  I have discussed the findings with the patient and she reports that her main concern is her thrombocytopenia and she is not reporting any problems so she would like to hold off on any further investigation at this time.   Note: This dictation was prepared with Dragon dictation along with smaller phrase technology. Any transcriptional errors that result from this process are unintentional.

## 2016-01-14 ENCOUNTER — Encounter: Payer: Medicare Other | Admitting: Surgery

## 2016-01-14 DIAGNOSIS — E10621 Type 1 diabetes mellitus with foot ulcer: Secondary | ICD-10-CM | POA: Diagnosis not present

## 2016-01-15 NOTE — Telephone Encounter (Signed)
Appointment completed 01/11/16.

## 2016-01-15 NOTE — Progress Notes (Signed)
SEARRA, LEY ([id number]) Visit Report for 01/14/2016 Chief Complaint Document Details Patient Name: [name], [name] Date of Service: 01/14/2016 2:15 PM Medical Record Number: [id number] Patient Account Number: [id number] Date of Birth/Sex: 27-Oct-1945 (70 y.o. Female) Treating [name]: Montey Hora Primary Care Physician: Caryl Bis, ERIC Other Clinician: Referring Physician: Caryl Bis, ERIC Treating Physician/Extender: Frann Rider in Treatment: 2 Information Obtained from: Patient Chief Complaint Patient is at the clinic for treatment of an open pressure ulcer to the left heel which she has for over 5 months Electronic Signature(s) Signed: 01/14/2016 2:52:36 PM By: Christin Fudge MD, FACS Entered By: Christin Fudge on 01/14/2016 14:52:35 [name] ([id number]) -------------------------------------------------------------------------------- Debridement Details Patient Name: [name] Date of Service: 01/14/2016 2:15 PM Medical Record Number: [id number] Patient Account Number: [id number] Date of Birth/Sex: 1945/11/29 (70 y.o. Female) Treating [name]: Montey Hora Primary Care Physician: Caryl Bis, ERIC Other Clinician: Referring Physician: Caryl Bis, ERIC Treating Physician/Extender: Frann Rider in Treatment: 2 Debridement Performed for Wound #2 Left Calcaneus Assessment: Performed By: Physician Christin Fudge, MD Debridement: Debridement Pre-procedure Yes - 14:40 Verification/Time Out Taken: Start Time: 14:40 Pain Control: Lidocaine 2% Topical Gel Level: Skin/Subcutaneous Tissue Total Area Debrided (L x 0.5 (cm) x 0.5 (cm) = 0.25 (cm) W): Tissue and other Non-Viable, Exudate, Fibrin/Slough, Skin, Subcutaneous material debrided: Instrument: Forceps, Scissors Bleeding: Minimum Hemostasis Achieved: Pressure End Time: 14:44 Procedural Pain: 0 Post Procedural Pain: 0 Response to Treatment: Procedure was tolerated well Post Debridement  Measurements of Total Wound Length: (cm) 0.5 Stage: Category/Stage III Width: (cm) 0.5 Depth: (cm) 0.6 Volume: (cm) 0.118 Character of Wound/Ulcer Post Requires Further Debridement: Debridement Severity of Tissue Post Fat layer exposed Debridement: Post Procedure Diagnosis Same as Pre-procedure Electronic Signature(s) Signed: 01/14/2016 2:55:30 PM By: Christin Fudge MD, FACS Signed: 01/14/2016 4:30:55 PM By: Montey Hora Previous Signature: 01/14/2016 2:52:21 PM Version By: Christin Fudge MD, FACS [name], [name] ([id number]) Entered By: Christin Fudge on 01/14/2016 14:55:30 [name] ([id number]) -------------------------------------------------------------------------------- HPI Details Patient Name: [name] Date of Service: 01/14/2016 2:15 PM Medical Record Number: [id number] Patient Account Number: [id number] Date of Birth/Sex: 15-Oct-1945 (70 y.o. Female) Treating [name]: Montey Hora Primary Care Physician: Caryl Bis, ERIC Other Clinician: Referring Physician: Caryl Bis, ERIC Treating Physician/Extender: Frann Rider in Treatment: 2 History of Present Illness Location: ulcerated on the right heel Quality: Patient reports experiencing a dull pain to affected area(s). Severity: Patient states wound are getting worse. Duration: Patient has had the wound for <5 months prior to presenting for treatment Timing: Pain in wound is Intermittent (comes and goes Context: The wound appeared gradually over time Modifying Factors: Consults to this date include: local care was done as per the medication given by the PCP which may have been medihoney Associated Signs and Symptoms: Patient reports having difficulty standing for long periods. 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 In the past she was seen for a decubitus ulcer on her right gluteal area. She is known to have diabetes mellitus type 1 and her most recent A1c was 7.2% Past medical history significant for leukopenia, cholelithiasis, hypertension, chronic diastolic CHF, coronary artery disease,type 1 diabetes mellitus, rheumatoid arthritis, collagen vascular disease, ovarian cancer, status post CABG in 2014 and status post Port-A-Cath insertion by Dr. Leotis Pain in October 2016. She has quit smoking about 20 years  ago. She was advised to use Medihoney with calcium alginate pads to be applied over the wound. 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 01/07/2016 -- x-ray of the left heel done -- IMPRESSION: No radiographic evidence of osteomyelitis. If this remains a clinical concern, recommend MR imaging. Electronic Signature(s) Signed: 01/14/2016 2:52:42 PM By: Christin Fudge MD, FACS Entered By: Christin Fudge on 01/14/2016 14:52:41 [name] ([id number]) -------------------------------------------------------------------------------- Physical Exam Details Patient Name: [name] Date of Service: 01/14/2016 2:15 PM Medical Record Number: [id number] Patient Account Number: [id number] Date of Birth/Sex: 1946-02-26 (70 y.o. Female) Treating [name]: Montey Hora Primary Care Physician: Caryl Bis, ERIC Other Clinician: Referring Physician: Caryl Bis, ERIC Treating Physician/Extender: Frann Rider in Treatment: 2 Constitutional . Pulse regular. Respirations normal and unlabored. Afebrile. . Eyes Nonicteric. Reactive to light. Ears, Nose, Mouth, and Throat Lips, teeth, and  gums WNL.Marland Kitchen Moist mucosa without lesions. Neck supple and nontender. No palpable supraclavicular or cervical adenopathy. Normal sized without goiter. Respiratory WNL. No retractions.. Cardiovascular Pedal Pulses WNL. No clubbing, cyanosis or edema. Lymphatic No adneopathy. No adenopathy. No adenopathy. Musculoskeletal Adexa without tenderness or enlargement.. Digits and nails w/o clubbing, cyanosis, infection, petechiae, ischemia, or inflammatory conditions.. Integumentary (Hair, Skin) No suspicious lesions. No crepitus or fluctuance. No peri-wound warmth or erythema. No masses.Marland Kitchen Psychiatric Judgement and insight Intact.. No evidence of depression, anxiety, or agitation.. Notes he has significant amount of subcutaneous debris and I have sharply debrided this with a forcep and scissors down to the fat pad and there is no probing down to bone Electronic Signature(s) Signed: 01/14/2016 2:53:51 PM By: Christin Fudge MD, FACS Entered By: Christin Fudge on 01/14/2016 14:53:50 [name] ([id number]) -------------------------------------------------------------------------------- Physician Orders Details Patient Name: [name] Date of Service: 01/14/2016 2:15 PM Medical Record Patient Account Number: [id number] Number: Afful, [name], [name], Treating [name]: Oct 08, 1945 (70 y.o. Velva Harman Date of Birth/Sex: Female) Other Clinician: Primary Care Physician: Caryl Bis, ERIC Treating Christin Fudge Referring Physician: Caryl Bis, ERIC Physician/Extender: Suella Grove in Treatment: 2 Verbal / Phone Orders: Yes Clinician: Afful, [name], [name], [name] Read Back and Verified: Yes Diagnosis Coding Wound Cleansing Wound #2 Left Calcaneus o Cleanse wound with mild soap and water o May Shower, gently pat wound dry prior to applying new dressing. Anesthetic Wound #2 Left Calcaneus o Topical Lidocaine 4% cream applied to wound bed prior to debridement Primary Wound Dressing Wound #2 Left  Calcaneus o Santyl Ointment o Medihoney gel - Use if you cannot afford SAntyl Secondary Dressing Wound #2 Left Calcaneus o Gauze and Kerlix/Conform Dressing Change Frequency Wound #2 Left Calcaneus o Change dressing every day. Follow-up Appointments Wound #2 Left Calcaneus o Return Appointment in 1 week. Off-Loading Wound #2 Left Calcaneus o Heel suspension boot to: - ELevate on pillows Additional Orders / Instructions Wound #2 Left Calcaneus o Increase protein intake. [name], [name] ([id number]) o Activity as tolerated o Other: - INclude Vitamin A, C, ZInc, MVI in diet Medications-please add to medication list. Wound #2 Left Calcaneus o Santyl Enzymatic Ointment Electronic Signature(s) Signed: 01/14/2016 3:58:34 PM By: Christin Fudge MD, FACS Signed: 01/14/2016 5:04:09 PM By: Regan Lemming BSN, [name] Entered By: Regan Lemming on 01/14/2016 14:44:15 [name], [name] ([id number]) -------------------------------------------------------------------------------- Problem  List Details Patient Name: [name], [name] Date of Service: 01/14/2016 2:15 PM Medical Record Number: [id number] Patient Account Number: [id number] Date of Birth/Sex: 06-09-1945 (70 y.o. Female) Treating [name]: Montey Hora Primary Care Physician: Caryl Bis, ERIC Other Clinician: Referring Physician: Caryl Bis, ERIC Treating Physician/Extender: Frann Rider in Treatment: 2 Active Problems ICD-10 Encounter Code Description Active Date Diagnosis E10.621 Type 1 diabetes mellitus with foot ulcer 12/31/2015 Yes L89.623 Pressure ulcer of left heel, stage 3 12/31/2015 Yes Z92.21 Personal history of antineoplastic chemotherapy 12/31/2015 Yes Z79.52 Long term (current) use of systemic steroids 12/31/2015 Yes Inactive Problems Resolved Problems Electronic Signature(s) Signed: 01/14/2016 2:51:57 PM By: Christin Fudge MD, FACS Entered By: Christin Fudge on 01/14/2016 14:51:56 [name]  ([id number]) -------------------------------------------------------------------------------- Progress Note Details Patient Name: [name] Date of Service: 01/14/2016 2:15 PM Medical Record Number: [id number] Patient Account Number: [id number] Date of Birth/Sex: 1945/10/27 (70 y.o. Female) Treating [name]: Montey Hora Primary Care Physician: Caryl Bis, ERIC Other Clinician: Referring Physician: Caryl Bis, ERIC Treating Physician/Extender: Frann Rider in Treatment: 2 Subjective Chief Complaint Information obtained from Patient Patient is at the clinic for treatment of an open pressure ulcer to the left heel which she has for over 5 months History of Present Illness (HPI) The following HPI elements were documented for the patient's wound: Location: ulcerated on the right heel Quality: Patient reports experiencing a dull pain to affected area(s). Severity: Patient states wound are getting worse. Duration: Patient has had the wound for Timing: Pain in wound is Intermittent (comes and goes Context: The wound appeared gradually over time Modifying Factors: Consults to this date include: local care was done as per the medication given by the PCP which may have been medihoney Associated Signs and Symptoms: Patient reports having difficulty standing for long periods. 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 In the past she was seen for a decubitus ulcer on her right gluteal area. She is known to have diabetes mellitus type 1 and her most recent A1c was 7.2% Past medical history significant for leukopenia, cholelithiasis, hypertension, chronic diastolic CHF, coronary artery disease,type 1 diabetes mellitus, rheumatoid arthritis, collagen vascular disease, ovarian cancer, status post CABG in 2014  and status post Port-A-Cath insertion by Dr. Leotis Pain in October 2016. She has quit smoking about 20 years ago. She was advised to use Medihoney with calcium alginate pads to be applied over the wound. 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 01/07/2016 -- x-ray of the left heel done -- IMPRESSION: No radiographic evidence of osteomyelitis. If this remains a clinical concern, recommend MR imaging. [name], [name] ([id number]) Objective Constitutional Pulse regular. Respirations normal and unlabored. Afebrile. Vitals Time Taken: 2:22 PM, Height: 63 in, Weight: 130 lbs, BMI: 23, Temperature: 98.2 F, Pulse: 75 bpm, Respiratory Rate: 18 breaths/min, Blood Pressure: 135/43 mmHg. Eyes Nonicteric. Reactive to light. Ears, Nose, Mouth, and Throat Lips, teeth, and gums WNL.Marland Kitchen Moist mucosa without lesions. Neck supple and nontender. No palpable supraclavicular or cervical adenopathy. Normal sized without goiter. Respiratory WNL. No retractions.. Cardiovascular Pedal  Pulses WNL. No clubbing, cyanosis or edema. Lymphatic No adneopathy. No adenopathy. No adenopathy. Musculoskeletal Adexa without tenderness or enlargement.. Digits and nails w/o clubbing, cyanosis, infection, petechiae, ischemia, or inflammatory conditions.Marland Kitchen Psychiatric Judgement and insight Intact.. No evidence of depression, anxiety, or agitation.. General Notes: he has significant amount of subcutaneous debris and I have sharply debrided this with a forcep and scissors down to the fat pad and there is no probing down to bone Integumentary (Hair, Skin) No suspicious lesions. No crepitus or fluctuance. No peri-wound warmth  or erythema. No masses.. Wound #2 status is Open. Original cause of wound was Pressure Injury. The wound is located on the Left Calcaneus. The wound measures 0.5cm length x 0.5cm width x 0.6cm depth; 0.196cm^2 area and 0.118cm^3 volume. The wound is limited to skin breakdown. There is no tunneling or undermining noted. There is a large amount of serous drainage noted. The wound margin is flat and intact. There is no granulation within the wound bed. There is a large (67-100%) amount of necrotic tissue within the wound bed including Eschar and Adherent Slough. The periwound skin appearance exhibited: Moist, Erythema. [name], [name] ([id number]) The periwound skin appearance did not exhibit: Callus, Crepitus, Excoriation, Fluctuance, Friable, Induration, Localized Edema, Rash, Scarring, Dry/Scaly, Maceration, Atrophie Blanche, Cyanosis, Ecchymosis, Hemosiderin Staining, Mottled, Pallor, Rubor. The surrounding wound skin color is noted with erythema which is circumferential. Periwound temperature was noted as No Abnormality. The periwound has tenderness on palpation. Assessment Active Problems ICD-10 E10.621 - Type 1 diabetes mellitus with foot ulcer L89.623 - Pressure ulcer of left heel, stage 3 Z92.21 - Personal history of antineoplastic chemotherapy Z79.52 - Long term (current) use of systemic steroids Procedures Wound #2 Wound #2 is a Pressure Ulcer located on the Left Calcaneus . There was a Skin/Subcutaneous Tissue Debridement [id number]) debridement with total area of 0.25 sq cm performed by Christin Fudge, MD. with the following instrument(s): Forceps and Scissors to remove Non-Viable tissue/material including Exudate, Fibrin/Slough, Skin, and Subcutaneous after achieving pain control using Lidocaine 2% Topical Gel. A time out was conducted at 14:40, prior to the start of the procedure. A Minimum amount of bleeding was controlled with Pressure. The procedure was tolerated well with  a pain level of 0 throughout and a pain level of 0 following the procedure. Post Debridement Measurements: 0.5cm length x 0.5cm width x 0.6cm depth; 0.118cm^3 volume. Post debridement Stage noted as Category/Stage III. Character of Wound/Ulcer Post Debridement requires further debridement. Severity of Tissue Post Debridement is: Fat layer exposed. Post procedure Diagnosis Wound #2: Same as Pre-Procedure Plan Wound Cleansing: Wound #2 Left Calcaneus: Cleanse wound with mild soap and water [name], [name] ([id number]) May Shower, gently pat wound dry prior to applying new dressing. Anesthetic: Wound #2 Left Calcaneus: Topical Lidocaine 4% cream applied to wound bed prior to debridement Primary Wound Dressing: Wound #2 Left Calcaneus: Santyl Ointment Medihoney gel - Use if you cannot afford SAntyl Secondary Dressing: Wound #2 Left Calcaneus: Gauze and Kerlix/Conform Dressing Change Frequency: Wound #2 Left Calcaneus: Change dressing every day. Follow-up Appointments: Wound #2 Left Calcaneus: Return Appointment in 1 week. Off-Loading: Wound #2 Left Calcaneus: Heel suspension boot to: - ELevate on pillows Additional Orders / Instructions: Wound #2 Left Calcaneus: Increase protein intake. Activity as tolerated Other: - INclude Vitamin A, C, ZInc, MVI in diet Medications-please add to medication list.: Wound #2 Left Calcaneus: Santyl Enzymatic Ointment I have recommended: 1. Off-Loading as much as possible and using proper footwear. 2. Santyl ointment to  the wound packed with plain packing gauze, and dressings to be changed daily 3. Continue good control of her diabetes mellitus 4. Adequate protein, vitamin C, zinc Electronic Signature(s) Signed: 01/14/2016 2:55:49 PM By: Christin Fudge MD, FACS Previous Signature: 01/14/2016 2:54:55 PM Version By: Christin Fudge MD, FACS Entered By: Christin Fudge on 01/14/2016 14:55:48 [name]  ([id number]) -------------------------------------------------------------------------------- SuperBill Details Patient Name: [name] Date of Service: 01/14/2016 Medical Record Number: [id number] Patient Account Number: [id number] Date of Birth/Sex: 1945/09/04 (70 y.o. Female) Treating [name]: Montey Hora Primary Care Physician: Caryl Bis, ERIC Other Clinician: Referring Physician: Caryl Bis, ERIC Treating Physician/Extender: Frann Rider in Treatment: 2 Diagnosis Coding ICD-10 Codes Code Description E10.621 Type 1 diabetes mellitus with foot ulcer L89.623 Pressure ulcer of left heel, stage 3 Z92.21 Personal history of antineoplastic chemotherapy Z79.52 Long term (current) use of systemic steroids Facility Procedures CPT4 Code: [id number] Description: [id number] - DEB SUBQ TISSUE 20 SQ CM/< ICD-10 Description Diagnosis E10.621 Type 1 diabetes mellitus with foot ulcer L89.623 Pressure ulcer of left heel, stage 3 Z92.21 Personal history of antineoplastic chemotherap Z79.52 Long term (current)  use of systemic steroids Modifier: y Quantity: 1 Physician Procedures CPT4 Code: [id number] Description: [id number] - WC PHYS SUBQ TISS 20 SQ CM ICD-10 Description Diagnosis E10.621 Type 1 diabetes mellitus with foot ulcer L89.623 Pressure ulcer of left heel, stage 3 Z92.21 Personal history of antineoplastic chemotherap Z79.52 Long term (current)  use of systemic steroids Modifier: y Quantity: 1 Electronic Signature(s) Signed: 01/14/2016 2:56:00 PM By: Christin Fudge MD, FACS Entered By: Christin Fudge on 01/14/2016 14:56:00

## 2016-01-15 NOTE — Progress Notes (Signed)
[name] ([id number]) Visit Report for 01/14/2016 Arrival Information Details Patient Name: [name], [name] Date of Service: 01/14/2016 2:15 PM Medical Record Number: [id number] Patient Account Number: [id number] Date of Birth/Sex: October 25, 1945 (70 y.o. Female) Treating RN: Montey Hora Primary Care Physician: Caryl Bis, ERIC Other Clinician: Referring Physician: Caryl Bis, ERIC Treating Physician/Extender: Frann Rider in Treatment: 2 Visit Information History Since Last Visit Added or deleted any medications: No Patient Arrived: Cane Any new allergies or adverse reactions: No Arrival Time: 14:21 Had a fall or experienced change in No Accompanied By: spouse activities of daily living that may affect Transfer Assistance: None risk of falls: Patient Identification Verified: Yes Signs or symptoms of abuse/neglect since last No Secondary Verification Process Yes visito Completed: Hospitalized since last visit: No Patient Has Alerts: Yes Pain Present Now: No Patient Alerts: Patient on Blood Thinner lovenox BID DMII Electronic Signature(s) Signed: 01/14/2016 4:30:55 PM By: Montey Hora Entered By: Montey Hora on 01/14/2016 14:21:55 [name] ([id number]) -------------------------------------------------------------------------------- Encounter Discharge Information Details Patient Name: [name] Date of Service: 01/14/2016 2:15 PM Medical Record Number: [id number] Patient Account Number: [id number] Date of Birth/Sex: [birth year] (70 y.o. Female) Treating RN: Montey Hora Primary Care Physician: Caryl Bis, ERIC Other Clinician: Referring Physician: Caryl Bis, ERIC Treating Physician/Extender: Frann Rider in Treatment: 2 Encounter Discharge Information Items Discharge Pain Level: 0 Discharge Condition: Stable Ambulatory Status: Cane Discharge Destination: Home Transportation: Private Auto Accompanied By: spouse Schedule  Follow-up Appointment: Yes Medication Reconciliation completed No and provided to Patient/Care [name]: Provided on Clinical Summary of Care: 01/14/2016 Form Type Recipient Paper Patient SR Electronic Signature(s) Signed: 01/14/2016 3:03:38 PM By: Montey Hora Previous Signature: 01/14/2016 2:54:15 PM Version By: Ruthine Dose Entered By: Montey Hora on 01/14/2016 15:03:37 [name] ([id number]) -------------------------------------------------------------------------------- Lower Extremity Assessment Details Patient Name: [name] Date of Service: 01/14/2016 2:15 PM Medical Record Number: [id number] Patient Account Number: [id number] Date of Birth/Sex: Nov 20, 1945 (70 y.o. Female) Treating RN: Montey Hora Primary Care Physician: Caryl Bis, ERIC Other Clinician: Referring Physician: Caryl Bis, ERIC Treating Physician/Extender: Frann Rider in Treatment: 2 Vascular Assessment Pulses: Posterior Tibial Dorsalis Pedis Palpable: [Left:Yes] Extremity colors, hair growth, and conditions: Extremity Color: [Left:Normal] Hair Growth on Extremity: [Left:No] Temperature of Extremity: [Left:Warm] Capillary Refill: [Left:< 3 seconds] Electronic Signature(s) Signed: 01/14/2016 4:30:55 PM By: Montey Hora Entered By: Montey Hora on 01/14/2016 14:28:55 [name] ([id number]) -------------------------------------------------------------------------------- Multi Wound Chart Details Patient Name: [name] Date of Service: 01/14/2016 2:15 PM Medical Record Number: [id number] Patient Account Number: [id number] Date of Birth/Sex: [birth year] (70 y.o. Female) Treating RN: Baruch Gouty, RN, BSN, Velva Harman Primary Care Physician: Caryl Bis, ERIC Other Clinician: Referring Physician: Caryl Bis, ERIC Treating Physician/Extender: Frann Rider in Treatment: 2 Vital Signs Height(in): 63 Pulse(bpm): 75 Weight(lbs): 130 Blood  Pressure 135/43 (mmHg): Body Mass Index(BMI): 23 Temperature(F): 98.2 Respiratory Rate 18 (breaths/min): Photos: [N/A:N/A] Wound Location: Left Calcaneus N/A N/A Wounding Event: Pressure Injury N/A N/A Primary Etiology: Pressure Ulcer N/A N/A Secondary Etiology: Diabetic Wound/Ulcer of N/A N/A the Lower Extremity Comorbid History: Cataracts, Type I N/A N/A Diabetes, Rheumatoid Arthritis, Osteoarthritis, Neuropathy, Received Chemotherapy Date Acquired: 07/15/2015 N/A N/A Weeks of Treatment: 2 N/A N/A Wound Status: Open N/A N/A Measurements L x W x D 0.5x0.5x0.6 N/A N/A (cm) Area (cm) : 0.196 N/A N/A Volume (cm) : 0.118 N/A N/A % Reduction in Area: 16.90% N/A N/A % Reduction in Volume: -25.50% N/A N/A Classification: Category/Stage III N/A N/A HBO Classification: Grade 1 N/A N/A Exudate Amount: Large N/A N/A Exudate  Type: Serous N/A N/A [name], [name] ([id number]) Exudate Color: amber N/A N/A Wound Margin: Flat and Intact N/A N/A Granulation Amount: None Present (0%) N/A N/A Necrotic Amount: Large (67-100%) N/A N/A Necrotic Tissue: Eschar, Adherent Slough N/A N/A Exposed Structures: Fascia: No N/A N/A Fat: No Tendon: No Muscle: No Joint: No Bone: No Limited to Skin Breakdown Epithelialization: None N/A N/A Periwound Skin Texture: Edema: No N/A N/A Excoriation: No Induration: No Callus: No Crepitus: No Fluctuance: No Friable: No Rash: No Scarring: No Periwound Skin Moist: Yes N/A N/A Moisture: Maceration: No Dry/Scaly: No Periwound Skin Color: Erythema: Yes N/A N/A Atrophie Blanche: No Cyanosis: No Ecchymosis: No Hemosiderin Staining: No Mottled: No Pallor: No Rubor: No Erythema Location: Circumferential N/A N/A Temperature: No Abnormality N/A N/A Tenderness on Yes N/A N/A Palpation: Wound Preparation: Ulcer Cleansing: N/A N/A Rinsed/Irrigated with Saline Topical Anesthetic Applied: Other: lidocaine 4% Treatment Notes Electronic  Signature(s) [name], [name] ([id number]) Signed: 01/14/2016 5:04:09 PM By: Regan Lemming BSN, RN Entered By: Regan Lemming on 01/14/2016 14:40:38 [name] ([id number]) -------------------------------------------------------------------------------- [place] Details Patient Name: [name] Date of Service: 01/14/2016 2:15 PM Medical Record Number: [id number] Patient Account Number: [id number] Date of Birth/Sex: April 18, 1946 (70 y.o. Female) Treating RN: Afful, RN, BSN, [place] Primary Care Physician: Caryl Bis, ERIC Other Clinician: Referring Physician: Caryl Bis, ERIC Treating Physician/Extender: Frann Rider in Treatment: 2 Active Inactive Orientation to the Wound Care Program Nursing Diagnoses: Knowledge deficit related to the wound healing center program Goals: Patient/caregiver will verbalize understanding of the [facility] Date Initiated: 12/31/2015 Goal Status: Active Interventions: Provide education on orientation to the wound center Notes: Pressure Nursing Diagnoses: Knowledge deficit related to causes and risk factors for pressure ulcer development Knowledge deficit related to management of pressures ulcers Potential for impaired tissue integrity related to pressure, friction, moisture, and shear Goals: Patient will remain free from development of additional pressure ulcers Date Initiated: 12/31/2015 Goal Status: Active Patient will remain free of pressure ulcers Date Initiated: 12/31/2015 Goal Status: Active Patient/caregiver will verbalize risk factors for pressure ulcer development Date Initiated: 12/31/2015 Goal Status: Active Patient/caregiver will verbalize understanding of pressure ulcer management Date Initiated: 12/31/2015 Goal Status: Active Interventions: [name], [name] ([id number]) Assess: immobility, friction, shearing, incontinence upon admission and as needed Assess offloading mechanisms upon  admission and as needed Assess potential for pressure ulcer upon admission and as needed Provide education on pressure ulcers Treatment Activities: Patient referred for pressure reduction/relief devices : 12/31/2015 Notes: Wound/Skin Impairment Nursing Diagnoses: Impaired tissue integrity Knowledge deficit related to smoking impact on wound healing Knowledge deficit related to ulceration/compromised skin integrity Goals: Patient/caregiver will verbalize understanding of skin care regimen Date Initiated: 12/31/2015 Goal Status: Active Ulcer/skin breakdown will have a volume reduction of 30% by week 4 Date Initiated: 12/31/2015 Goal Status: Active Ulcer/skin breakdown will have a volume reduction of 50% by week 8 Date Initiated: 12/31/2015 Goal Status: Active Ulcer/skin breakdown will have a volume reduction of 80% by week 12 Date Initiated: 12/31/2015 Goal Status: Active Ulcer/skin breakdown will heal within 14 weeks Date Initiated: 12/31/2015 Goal Status: Active Interventions: Assess patient/caregiver ability to obtain necessary supplies Assess patient/caregiver ability to perform ulcer/skin care regimen upon admission and as needed Assess ulceration(s) every visit Provide education on ulcer and skin care Treatment Activities: Skin care regimen initiated : 12/31/2015 Topical wound management initiated : 12/31/2015 Notes: [name], [name] ([id number]) Electronic Signature(s) Signed: 01/14/2016 5:04:09 PM By: Regan Lemming BSN, RN Entered By: Regan Lemming on 01/14/2016  [place], [place] ([id number]) -------------------------------------------------------------------------------- Pain Assessment Details Patient Name: [name], [name] Date of Service: 01/14/2016 2:15 PM Medical Record Number: [id number] Patient Account Number: [id number] Date of Birth/Sex: [birth year] (70 y.o. Female) Treating RN: Montey Hora Primary Care Physician: Caryl Bis, ERIC Other  Clinician: Referring Physician: Caryl Bis, ERIC Treating Physician/Extender: Frann Rider in Treatment: 2 Active Problems Location of Pain Severity and Description of Pain Patient Has Paino No Site Locations Pain Management and Medication Current Pain Management: Notes Topical or injectable lidocaine is offered to patient for acute pain when surgical debridement is performed. If needed, Patient is instructed to use over the counter pain medication for the following 24-48 hours after debridement. Wound care MDs do not prescribed pain medications. Patient has chronic pain or uncontrolled pain. Patient has been instructed to make an appointment with their Primary Care Physician for pain management. Electronic Signature(s) Signed: 01/14/2016 4:30:55 PM By: Montey Hora Entered By: Montey Hora on 01/14/2016 14:22:02 [name] ([id number]) -------------------------------------------------------------------------------- Patient/Caregiver Education Details Patient Name: [name] Date of Service: 01/14/2016 2:15 PM Medical Record Number: [id number] Patient Account Number: [id number] Date of Birth/Gender: 05-28-1945 (70 y.o. Female) Treating RN: Montey Hora Primary Care Physician: Caryl Bis, ERIC Other Clinician: Referring Physician: Caryl Bis, ERIC Treating Physician/Extender: Frann Rider in Treatment: 2 Education Assessment Education Provided To: Patient and Caregiver Education Topics Provided Wound/Skin Impairment: Handouts: Other: wound care as ordered Methods: Explain/Verbal Responses: State content correctly Electronic Signature(s) Signed: 01/14/2016 4:30:55 PM By: Montey Hora Entered By: Montey Hora on 01/14/2016 15:04:52 [name] ([id number]) -------------------------------------------------------------------------------- Wound Assessment Details Patient Name: [name] Date of Service: 01/14/2016 2:15 PM Medical  Record Number: [id number] Patient Account Number: [id number] Date of Birth/Sex: 1945-07-23 (70 y.o. Female) Treating RN: Montey Hora Primary Care Physician: Caryl Bis, ERIC Other Clinician: Referring Physician: Caryl Bis, ERIC Treating Physician/Extender: Frann Rider in Treatment: 2 Wound Status Wound Number: 2 Primary Pressure Ulcer Etiology: Wound Location: Left Calcaneus Secondary Diabetic Wound/Ulcer of the Lower Wounding Event: Pressure Injury Etiology: Extremity Date Acquired: 07/15/2015 Wound Open Weeks Of Treatment: 2 Status: Clustered Wound: No Comorbid Cataracts, Type I Diabetes, History: Rheumatoid Arthritis, Osteoarthritis, Neuropathy, Received Chemotherapy Photos Wound Measurements Length: (cm) 0.5 Width: (cm) 0.5 Depth: (cm) 0.6 Area: (cm) 0.196 Volume: (cm) 0.118 % Reduction in Area: 16.9% % Reduction in Volume: -25.5% Epithelialization: None Tunneling: No Undermining: No Wound Description Classification: Category/Stage III Diabetic Severity Janet [place]): Grade 1 Wound Margin: Flat and Intact Exudate Amount: Large Exudate Type: Serous Exudate Color: amber Foul Odor After Cleansing: No Wound Bed Granulation Amount: None Present (0%) Exposed Structure Necrotic Amount: Large (67-100%) Fascia Exposed: No [name], [name] ([id number]) Necrotic Quality: Eschar, Adherent Slough Fat Layer Exposed: No Tendon Exposed: No Muscle Exposed: No Joint Exposed: No Bone Exposed: No Limited to Skin Breakdown Periwound Skin Texture Texture Color No Abnormalities Noted: No No Abnormalities Noted: No Callus: No Atrophie Blanche: No Crepitus: No Cyanosis: No Excoriation: No Ecchymosis: No Fluctuance: No Erythema: Yes Friable: No Erythema Location: Circumferential Induration: No Hemosiderin Staining: No Localized Edema: No Mottled: No Rash: No Pallor: No Scarring: No Rubor: No Moisture Temperature / Pain No Abnormalities Noted:  No Temperature: No Abnormality Dry / Scaly: No Tenderness on Palpation: Yes Maceration: No Moist: Yes Wound Preparation Ulcer Cleansing: Rinsed/Irrigated with Saline Topical Anesthetic Applied: Other: lidocaine 4%, Treatment Notes Wound #2 (Left Calcaneus) 1. Cleansed with: Clean wound with Normal Saline 2. Anesthetic Topical Lidocaine 4% cream to wound bed prior to debridement 4. Dressing Applied: Santyl Ointment  5. Secondary Dressing Applied Dry Gauze 7. Secured with Tape Notes netting Electronic Signature(s) Signed: 01/14/2016 4:30:55 PM By: Dorrene German ([id number]) Entered By: Montey Hora on 01/14/2016 14:29:24 [name], [name] ([id number]) -------------------------------------------------------------------------------- Vitals Details Patient Name: [name] Date of Service: 01/14/2016 2:15 PM Medical Record Number: [id number] Patient Account Number: [id number] Date of Birth/Sex: [birth year] (70 y.o. Female) Treating RN: Montey Hora Primary Care Physician: Caryl Bis, ERIC Other Clinician: Referring Physician: Caryl Bis, ERIC Treating Physician/Extender: Frann Rider in Treatment: 2 Vital Signs Time Taken: 14:22 Temperature (F): 98.2 Height (in): 63 Pulse (bpm): 75 Weight (lbs): 130 Respiratory Rate (breaths/min): 18 Body Mass Index (BMI): 23 Blood Pressure (mmHg): 135/43 Reference Range: 80 - 120 mg / dl Electronic Signature(s) Signed: 01/14/2016 4:30:55 PM By: Montey Hora Entered By: Montey Hora on 01/14/2016 14:24:17

## 2016-01-15 NOTE — Progress Notes (Signed)
I have reviewed the above note and agree.  Bailie Christenbury, M.D.  

## 2016-01-22 ENCOUNTER — Other Ambulatory Visit: Payer: Self-pay

## 2016-01-22 ENCOUNTER — Inpatient Hospital Stay: Payer: Medicare Other

## 2016-01-22 ENCOUNTER — Other Ambulatory Visit: Payer: Self-pay | Admitting: *Deleted

## 2016-01-22 ENCOUNTER — Encounter: Payer: Self-pay | Admitting: Hematology and Oncology

## 2016-01-22 ENCOUNTER — Inpatient Hospital Stay: Payer: Medicare Other | Attending: Hematology and Oncology | Admitting: Hematology and Oncology

## 2016-01-22 ENCOUNTER — Encounter: Payer: Medicare Other | Admitting: Surgery

## 2016-01-22 VITALS — BP 128/70 | HR 77 | Temp 99.0°F | Ht 63.0 in | Wt 133.3 lb

## 2016-01-22 DIAGNOSIS — E109 Type 1 diabetes mellitus without complications: Secondary | ICD-10-CM | POA: Insufficient documentation

## 2016-01-22 DIAGNOSIS — K222 Esophageal obstruction: Secondary | ICD-10-CM | POA: Diagnosis not present

## 2016-01-22 DIAGNOSIS — C801 Malignant (primary) neoplasm, unspecified: Secondary | ICD-10-CM

## 2016-01-22 DIAGNOSIS — I2510 Atherosclerotic heart disease of native coronary artery without angina pectoris: Secondary | ICD-10-CM | POA: Diagnosis not present

## 2016-01-22 DIAGNOSIS — D72819 Decreased white blood cell count, unspecified: Secondary | ICD-10-CM

## 2016-01-22 DIAGNOSIS — I5032 Chronic diastolic (congestive) heart failure: Secondary | ICD-10-CM | POA: Insufficient documentation

## 2016-01-22 DIAGNOSIS — R748 Abnormal levels of other serum enzymes: Secondary | ICD-10-CM | POA: Diagnosis not present

## 2016-01-22 DIAGNOSIS — Z8639 Personal history of other endocrine, nutritional and metabolic disease: Secondary | ICD-10-CM

## 2016-01-22 DIAGNOSIS — C786 Secondary malignant neoplasm of retroperitoneum and peritoneum: Secondary | ICD-10-CM | POA: Insufficient documentation

## 2016-01-22 DIAGNOSIS — C569 Malignant neoplasm of unspecified ovary: Secondary | ICD-10-CM | POA: Insufficient documentation

## 2016-01-22 DIAGNOSIS — Z452 Encounter for adjustment and management of vascular access device: Secondary | ICD-10-CM | POA: Diagnosis not present

## 2016-01-22 DIAGNOSIS — K219 Gastro-esophageal reflux disease without esophagitis: Secondary | ICD-10-CM | POA: Insufficient documentation

## 2016-01-22 DIAGNOSIS — I10 Essential (primary) hypertension: Secondary | ICD-10-CM | POA: Insufficient documentation

## 2016-01-22 DIAGNOSIS — Z808 Family history of malignant neoplasm of other organs or systems: Secondary | ICD-10-CM

## 2016-01-22 DIAGNOSIS — Z794 Long term (current) use of insulin: Secondary | ICD-10-CM | POA: Insufficient documentation

## 2016-01-22 DIAGNOSIS — I252 Old myocardial infarction: Secondary | ICD-10-CM | POA: Diagnosis not present

## 2016-01-22 DIAGNOSIS — Z8701 Personal history of pneumonia (recurrent): Secondary | ICD-10-CM

## 2016-01-22 DIAGNOSIS — J90 Pleural effusion, not elsewhere classified: Secondary | ICD-10-CM

## 2016-01-22 DIAGNOSIS — D649 Anemia, unspecified: Secondary | ICD-10-CM | POA: Diagnosis not present

## 2016-01-22 DIAGNOSIS — I82C11 Acute embolism and thrombosis of right internal jugular vein: Secondary | ICD-10-CM | POA: Insufficient documentation

## 2016-01-22 DIAGNOSIS — Z95828 Presence of other vascular implants and grafts: Secondary | ICD-10-CM

## 2016-01-22 DIAGNOSIS — Z9221 Personal history of antineoplastic chemotherapy: Secondary | ICD-10-CM | POA: Insufficient documentation

## 2016-01-22 DIAGNOSIS — R971 Elevated cancer antigen 125 [CA 125]: Secondary | ICD-10-CM | POA: Insufficient documentation

## 2016-01-22 DIAGNOSIS — E785 Hyperlipidemia, unspecified: Secondary | ICD-10-CM | POA: Insufficient documentation

## 2016-01-22 DIAGNOSIS — Z7901 Long term (current) use of anticoagulants: Secondary | ICD-10-CM | POA: Insufficient documentation

## 2016-01-22 DIAGNOSIS — Z90722 Acquired absence of ovaries, bilateral: Secondary | ICD-10-CM | POA: Insufficient documentation

## 2016-01-22 DIAGNOSIS — Z90710 Acquired absence of both cervix and uterus: Secondary | ICD-10-CM | POA: Insufficient documentation

## 2016-01-22 DIAGNOSIS — M069 Rheumatoid arthritis, unspecified: Secondary | ICD-10-CM | POA: Diagnosis not present

## 2016-01-22 DIAGNOSIS — Z87891 Personal history of nicotine dependence: Secondary | ICD-10-CM | POA: Insufficient documentation

## 2016-01-22 DIAGNOSIS — I998 Other disorder of circulatory system: Secondary | ICD-10-CM | POA: Insufficient documentation

## 2016-01-22 DIAGNOSIS — K769 Liver disease, unspecified: Secondary | ICD-10-CM | POA: Insufficient documentation

## 2016-01-22 DIAGNOSIS — E876 Hypokalemia: Secondary | ICD-10-CM

## 2016-01-22 DIAGNOSIS — Z862 Personal history of diseases of the blood and blood-forming organs and certain disorders involving the immune mechanism: Secondary | ICD-10-CM | POA: Insufficient documentation

## 2016-01-22 DIAGNOSIS — R188 Other ascites: Secondary | ICD-10-CM | POA: Insufficient documentation

## 2016-01-22 DIAGNOSIS — E10621 Type 1 diabetes mellitus with foot ulcer: Secondary | ICD-10-CM | POA: Diagnosis not present

## 2016-01-22 DIAGNOSIS — R0602 Shortness of breath: Secondary | ICD-10-CM | POA: Insufficient documentation

## 2016-01-22 DIAGNOSIS — Z79899 Other long term (current) drug therapy: Secondary | ICD-10-CM

## 2016-01-22 DIAGNOSIS — Z9641 Presence of insulin pump (external) (internal): Secondary | ICD-10-CM | POA: Insufficient documentation

## 2016-01-22 LAB — COMPREHENSIVE METABOLIC PANEL
ALT: 20 U/L (ref 14–54)
AST: 16 U/L (ref 15–41)
Albumin: 3.3 g/dL — ABNORMAL LOW (ref 3.5–5.0)
Alkaline Phosphatase: 60 U/L (ref 38–126)
Anion gap: 6 (ref 5–15)
BUN: 19 mg/dL (ref 6–20)
CO2: 26 mmol/L (ref 22–32)
Calcium: 8.9 mg/dL (ref 8.9–10.3)
Chloride: 103 mmol/L (ref 101–111)
Creatinine, Ser: 0.43 mg/dL — ABNORMAL LOW (ref 0.44–1.00)
GFR calc Af Amer: >60 mL/min (ref >60)
GFR calc non Af Amer: >60 mL/min (ref >60)
Glucose, Bld: 166 mg/dL — ABNORMAL HIGH (ref 65–99)
Potassium: 4.6 mmol/L (ref 3.5–5.1)
Sodium: 135 mmol/L (ref 135–145)
Total Bilirubin: 0.4 mg/dL (ref 0.3–1.2)
Total Protein: 7.1 g/dL (ref 6.5–8.1)

## 2016-01-22 LAB — CBC WITH DIFFERENTIAL/PLATELET
Basophils Absolute: 0 10*3/uL (ref 0–0.1)
Basophils Relative: 1 %
Eosinophils Absolute: 0 10*3/uL (ref 0–0.7)
Eosinophils Relative: 2 %
HCT: 34.1 % — ABNORMAL LOW (ref 35.0–47.0)
Hemoglobin: 11.8 g/dL — ABNORMAL LOW (ref 12.0–16.0)
Lymphocytes Relative: 27 %
Lymphs Abs: 0.6 10*3/uL — ABNORMAL LOW (ref 1.0–3.6)
MCH: 29.7 pg (ref 26.0–34.0)
MCHC: 34.7 g/dL (ref 32.0–36.0)
MCV: 85.6 fL (ref 80.0–100.0)
Monocytes Absolute: 0.3 10*3/uL (ref 0.2–0.9)
Monocytes Relative: 12 %
Neutro Abs: 1.3 10*3/uL — ABNORMAL LOW (ref 1.4–6.5)
Neutrophils Relative %: 58 %
Platelets: 168 10*3/uL (ref 150–440)
RBC: 3.99 MIL/uL (ref 3.80–5.20)
RDW: 14.5 % (ref 11.5–14.5)
WBC: 2.3 10*3/uL — ABNORMAL LOW (ref 3.6–11.0)

## 2016-01-22 MED ORDER — HEPARIN SOD (PORK) LOCK FLUSH 100 UNIT/ML IV SOLN
500.0000 [IU] | Freq: Once | INTRAVENOUS | Status: AC
  Administered 2016-01-22: 500 [IU] via INTRAVENOUS

## 2016-01-22 MED ORDER — SODIUM CHLORIDE 0.9% FLUSH
10.0000 mL | INTRAVENOUS | Status: DC | PRN
  Administered 2016-01-22: 10 mL via INTRAVENOUS
  Filled 2016-01-22: qty 10

## 2016-01-22 NOTE — Progress Notes (Signed)
[facility] day:  01/22/16   Chief Complaint: [name] is a 70 y.o. female with stage IIIC ovarian cancer, persistentt leukopenia, and a history of right sided empyema and liver abscess who is seen for 1 month assessment.  HPI:  The patient was last seen in the medical oncology clinic on 12/15/2015.  At that time, she had persistent leukopenia (ANC 400).  Bone marrow aspirate and biopsy were reviewed.  Findings were non-specific and similar to her bone marrow in 2012.  We discussed referral back to Dr. Jefm Bryant for management of her rheumatoid arthritis which helped normalize counts in the past.  She has been on methotrexate 4 pills weekly and prednisone 5 mg a day.  Counts are improving.  She saw Dr. Jefm Bryant 2 days ago.  She states that he would like to treat her for 1 more month to improve counts further.  She saw Dr. Allen Norris on 01/12/2016.   MRCP did not show any obstructing lesion although a ampullary lesion could not be ruled out. No intervention is needed at this time.  Symptomatically, she is doing well.  Weight is up 4 pounds.  She denies any fevers or infections.   Past Medical History:  Diagnosis Date  . CAD (coronary artery disease)    a. 09/2012 Cath: LM nl, LAD 95p, 96m LCX 980mOM2 50, RCA 100.  . Marland Kitchenholelithiasis   . Chronic diastolic CHF (congestive heart failure) ([facility] [place]  . Collagen vascular disease ([facility]  . Esophageal stricture   . Exertional shortness of breath   . GERD (gastroesophageal reflux disease)   . H/O hiatal hernia   . Herniated disc   . History of pancytopenia   . Hyperlipidemia   . Hypertension   . Hypokalemia   . Leukopenia 2012   s/p bone marrow biopsy, Dr. PaMa Hillock. NSTEMI (non-ST elevated myocardial infarction) ([facility]CD[place]5/2014   "mild" (10/18/2012)  . Ovarian cancer ([facility]  . Pneumonia 2013; 08/2012   "one lung; double" (10/18/2012)  . Rheumatoid arthritis(714.0)   . Type I diabetes mellitus ([facility]   "dx'd in 1957" (10/18/2012)    Past Surgical History:  Procedure Laterality Date  . ABDOMINAL HYSTERECTOMY  06/15/2015   Dx L/S, EXLAP TAH BSO omentectomy RSRx colostomy diaphragm resection stripping  . CARDIAC CATHETERIZATION  10/18/2012   "first one was today" (10/18/2012)  . CATARACT EXTRACTION W/ INTRAOCULAR LENS IMPLANT Right 2010  . CHOLECYSTECTOMY  06/15/2015   combined case with ovarian cancer debulking  . COLON SURGERY    . CORONARY ARTERY BYPASS GRAFT N/A 10/19/2012   Procedure: CORONARY ARTERY BYPASS GRAFTING (CABG);  Surgeon: StMelrose NakayamaMD;  [facility];  Laterality: N/A;  . ESOPHAGEAL DILATION     "3 or 4 times" (10/19/2011)  . ESOPHAGOGASTRODUODENOSCOPY  2012   Dr. IfMinna Merritts. OSTOMY    . OVARY SURGERY     removal  . PERIPHERAL VASCULAR CATHETERIZATION N/A 03/02/2015   Procedure: PoGlori Luisath Insertion;  Surgeon: JaAlgernon HuxleyMD;  [facility];  Service: Cardiovascular;  Laterality: N/A;  . TUBAL LIGATION  1970    Family History  Problem Relation Age of Onset  . Diabetes Mother   . Arthritis Mother   . Diabetes Father   . Arthritis Father   . Bone cancer Sister     Social History:  reports that she quit smoking about 21 years  ago. Her smoking use included Cigarettes. She has a 30.00 pack-year smoking history. She has never used smokeless tobacco. She reports that she does not drink alcohol or use drugs.  The patient is accompanied by her husband today.  Allergies:  Allergies  Allergen Reactions  . Codeine Nausea And Vomiting  . Latex Rash    Current Medications: Current Outpatient Prescriptions  Medication Sig Dispense Refill  . Calcium-Vitamin D 600-200 MG-UNIT tablet Take 2 tablets by mouth daily.     Marland Kitchen enoxaparin (LOVENOX) 60 MG/0.6ML injection Inject 60 mg into the skin. Held last pm and this am dose for bone marrow biopsy    . folic acid (FOLVITE) 1 MG tablet TAKE 1 TABLET BY MOUTH ONCE A DAY 90  tablet 3  . furosemide (LASIX) 80 MG tablet Take 80 mg by mouth daily as needed.     . Insulin Human (INSULIN PUMP) SOLN Pt uses Humalog insulin.    Marland Kitchen insulin lispro (HUMALOG) 100 UNIT/ML injection Inject 0.06 mLs (6 Units total) into the skin daily. 30 mL 3  . lidocaine-prilocaine (EMLA) cream Apply 1 application topically as needed (prior to accessing port).    . loratadine (CLARITIN) 10 MG tablet Take 1 tablet by mouth daily as needed.    Marland Kitchen losartan (COZAAR) 100 MG tablet TAKE 1 TABLET BY MOUTH ONCE A DAY 90 tablet 1  . methotrexate (RHEUMATREX) 2.5 MG tablet Take by mouth.    . metoprolol tartrate (LOPRESSOR) 25 MG tablet TAKE 1 TABLET BY MOUTH TWICE A DAY AS NEEDED 180 tablet 3  . ondansetron (ZOFRAN) 8 MG tablet Take 1 tablet (8 mg total) by mouth every 8 (eight) hours as needed for nausea or vomiting. 20 tablet 1  . ONE TOUCH ULTRA TEST test strip     . oxyCODONE (OXY IR/ROXICODONE) 5 MG immediate release tablet Take 5 mg by mouth every 6 (six) hours as needed. Reported on 11/17/2015    . pantoprazole (PROTONIX) 40 MG tablet TAKE 1 TABLET BY MOUTH TWICE A DAY 180 tablet 1  . potassium chloride SA (K-DUR,KLOR-CON) 20 MEQ tablet Take 2 tablets (40 mEq total) by mouth daily. 60 tablet 3  . predniSONE (DELTASONE) 5 MG tablet Take 5 mg by mouth as needed. Reported on 09/17/2015    . ranitidine (ZANTAC) 150 MG tablet Take 1 tablet (150 mg total) by mouth 2 (two) times daily. (Patient taking differently: Take 150 mg by mouth 2 (two) times daily as needed. ) 180 tablet 2  . vitamin C (ASCORBIC ACID) 500 MG tablet Take 500 mg by mouth daily.    Marland Kitchen zinc gluconate 50 MG tablet Take 50 mg by mouth daily.     No current facility-administered medications for this visit.    Facility-Administered Medications Ordered in Other Visits  Medication Dose Route Frequency Provider Last Rate Last Dose  . sodium chloride 0.9 % injection 10 mL  10 mL Intracatheter PRN Lequita Asal, MD      . sodium chloride  0.9 % injection 10 mL  10 mL Intravenous PRN Lequita Asal, MD   10 mL at 04/13/15 7322    Review of Systems:  GENERAL:  Feels better.  No fever, chills or sweats.  Weight up 4 pounds. PERFORMANCE STATUS (ECOG):  1-2 HEENT:  No visual changes, sore throat, mouth sores or tenderness. Lungs:  No shortness of breath or cough.  No hemoptysis. Cardiac:  No chest pain, palpitations, orthopnea, or PND. GI:  Eating well.  No  abdominal pain.  No constipation, diarrhea, melena or hematochezia. GU:  No urgency, frequency, dysuria, or hematuria. Musculoskeletal:  Severe rheumatoid arthritis, improving.  Osteoporosis.  No back pain. No muscle tenderness. Extremities:  No pain or swelling. Skin:  No rashes or skin changes. Neuro:  Neuropathy (stable).  No headache, numbness or weakness, balance or coordination issues. Endocrine:  Diabetes on an insulin pump.  No thyroid issues, hot flashes or night sweats. Psych:  No mood changes, depression or anxiety. Pain: No pain. Review of systems:  All other systems reviewed and found to be negative.  Physical Exam: Blood pressure 128/70, pulse 77, temperature 99 F (37.2 C), temperature source Oral, height 5' 3"  (1.6 m), weight 133 lb 4.3 oz (60.5 kg). GENERAL:  Well developed, well nourished woman sitting comfortably in the exam room in no acute distress. MENTAL STATUS:  Alert and oriented to person, place and time. HEAD:  Short gray hair.  Normocephalic, atraumatic, face symmetric, no Cushingoid features. EYES:  Gold rimmed glasses.  Blue eyes.  No conjunctivitis or scleral icterus.  ENT: Oropharynx clear without lesion. Tongue normal. Mucous membranes moist.  RESPIRATORY: Clear to auscultation without rales, wheezes or rhonchi. CARDIOVASCULAR: Regular rate and rhythm without murmur, rub or gallop. ABDOMEN: Soft, non-tender with active bowel sounds and no appreciable hepatosplenomegaly. No palpable nodularity or masses. SKIN: No rashes, ulcers,  or bruises. EXTREMITIES:  No edema, skin discoloration or tenderness. No palpable cords. LYMPH NODES: No palpable cervical, supraclavicular, axillary or inguinal adenopathy  NEUROLOGICAL: Appropriate. PSYCH: Appropriate.   Orders Only on 01/22/2016  Component Date Value Ref Range Status  . WBC 01/22/2016 2.3* 3.6 - 11.0 K/uL Final  . RBC 01/22/2016 3.99  3.80 - 5.20 MIL/uL Final  . Hemoglobin 01/22/2016 11.8* 12.0 - 16.0 g/dL Final  . HCT 01/22/2016 34.1* 35.0 - 47.0 % Final  . MCV 01/22/2016 85.6  80.0 - 100.0 fL Final  . MCH 01/22/2016 29.7  26.0 - 34.0 pg Final  . MCHC 01/22/2016 34.7  32.0 - 36.0 g/dL Final  . RDW 01/22/2016 14.5  11.5 - 14.5 % Final  . Platelets 01/22/2016 168  150 - 440 K/uL Final  . Neutrophils Relative % 01/22/2016 58  % Final  . Neutro Abs 01/22/2016 1.3* 1.4 - 6.5 K/uL Final  . Lymphocytes Relative 01/22/2016 27  % Final  . Lymphs Abs 01/22/2016 0.6* 1.0 - 3.6 K/uL Final  . Monocytes Relative 01/22/2016 12  % Final  . Monocytes Absolute 01/22/2016 0.3  0.2 - 0.9 K/uL Final  . Eosinophils Relative 01/22/2016 2  % Final  . Eosinophils Absolute 01/22/2016 0.0  0 - 0.7 K/uL Final  . Basophils Relative 01/22/2016 1  % Final  . Basophils Absolute 01/22/2016 0.0  0 - 0.1 K/uL Final  . Sodium 01/22/2016 135  135 - 145 mmol/L Final  . Potassium 01/22/2016 4.6  3.5 - 5.1 mmol/L Final  . Chloride 01/22/2016 103  101 - 111 mmol/L Final  . CO2 01/22/2016 26  22 - 32 mmol/L Final  . Glucose, Bld 01/22/2016 166* 65 - 99 mg/dL Final  . BUN 01/22/2016 19  6 - 20 mg/dL Final  . Creatinine, Ser 01/22/2016 0.43* 0.44 - 1.00 mg/dL Final  . Calcium 01/22/2016 8.9  8.9 - 10.3 mg/dL Final  . Total Protein 01/22/2016 7.1  6.5 - 8.1 g/dL Final  . Albumin 01/22/2016 3.3* 3.5 - 5.0 g/dL Final  . AST 01/22/2016 16  15 - 41 U/L Final  . ALT 01/22/2016  20  14 - 54 U/L Final  . Alkaline Phosphatase 01/22/2016 60  38 - 126 U/L Final  . Total Bilirubin 01/22/2016 0.4  0.3 - 1.2  mg/dL Final  . GFR calc non Af Amer 01/22/2016 >60  >60 mL/min Final  . GFR calc Af Amer 01/22/2016 >60  >60 mL/min Final   Comment: (NOTE) The eGFR has been calculated using the CKD EPI equation. This calculation has not been validated in all clinical situations. eGFR's persistently <60 mL/min signify possible Chronic Kidney Disease.   Georgiann Hahn gap 01/22/2016 6  5 - 15 Final    Assessment:  SUHANI STILLION is a 70 y.o. female with clinical stage IIIC (T3cN1Mx) ovarian cancer presenting with abdominal discomfort and bloating.  Omental biopsy on 02/23/2015 revealed metastatic high grade serous carcinoma, consistent with gynecologic origin.  CA125 was 707 on 02/17/2015.  Abdomen and pelvic CT scan on 02/13/2015 revealed bilateral mass-like adnexal regions (right adnexal mass 5.6 x 5.0 cm and the left adnexa mass 10.3 x 6.0 x 9.9 cm).  There was a large amount of soft tissue throughout the peritoneal cavity involving the omentum and other peritoneal surfaces.  There was a small volume ascites. There was a peripheral 3.3 x 1.9 cm low-attenuation lesion overlying the right lobe of the liver , likely representing a serosal implant. There was 1.4 x 2.2 cm ill-defined peripheral lesion within the inferior aspect of segment 6 adjacent to the ampulla in the duodenum.   She received 4 cycles of neoadjuvant carboplatin and Taxol (03/05/2015 - 05/22/2015).  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  She had loose stools on oral magnesium.  She was initially on Neurontin then switched Lyrica with cycle #3.  Cycle #4 was notable for neutropenia (ANC 300) requiring GCSF x 3 days.    CA125 was 802.9 on 03/30/2015, 567.9 on 04/13/2015, 168.8 on 05/15/2015, 85.2 on 07/17/2015, 68.6 on 07/28/2015, 34.5 on 09/17/2015, 20.7 on 11/06/2015, and 49 on 01/22/2016.  Abdominal and pelvic CT scan on 04/28/2015 revealed decreasing bilateral ovarian masses.   The left adnexal mass measured 4.0 x 6.1 cm (previously 5.0 x  8.5 cm). The right ovary measured 3.8 x 4.9 cm (previously 4.7 x 5.6 cm).  There was improved peritoneal carcinomatosis.  There was a small amount of ascites.  The hepatic dome lesion was stable. The previously seen right hepatic lobe lesion was not well-visualized.  There was a small left pleural effusion and trace right pleural effusion.  The nodular lesion at the ampulla of Vater, extending into the duodenum was stable.  There was no biliary ductal dilatation.  She underwent exploratory laparotomy, lysis of adhesions, total abdominal hysterectomy with bilateral salpingo-oophorectomy, infracolic omentectomy, optimal tumor debulking(< 1 cm), recto-sigmoid resection with creation of end colostomy, cholecystectomy, mobilization of splenic flexure and liver with diaphragmatic stripping on 06/15/2015. The right diaphragm was cleared of tumor. During dissection, the diaphragm was entered and closed with sutures.   She has had a recurrent right sided pleural effusion.  She underwent thoracentesis of 650 cc on post-operative day 3.  She was admitted to [facility] on 07/01/2015 and 07/07/2015 for recurrent shortness of breath.  She has undergone 2 additional thoracentesis (1.1 L on 07/02/2015 and 850 cc on 07/08/2015).  Cytology was negative x 2.  Bilateral lower extremity duplex on 07/03/2015 was negative.  Echo on 07/08/2015 revealed en EF of 55-60%.    She has severe rheumatoid arthritis.  Methotrexate and Enbrel were initially on hold.  She has a normocytic anemia.  Work-up on 02/17/2015 revealed a normal ferritin, B12, folate, TSH.  She denies any melena or hematochezia.    She has anemia likely due to chronic disease. She received 1 unit PRBCs during her admission at [facility]. She denies any melena or hematochezia. She has diabetes and is on an insulin pump.  Rght upper extremity ultrasound on 08/01/2015 revealed a near occlusive thrombus within the central portion of the right internal jugular vein and central  portion of the right subclavian vein. She is on Lovenox 60 mg twice a day.   She was admitted to [facility] from 07/29/2015 - 08/10/2015 with a right-sided empyema and liver abscess. She underwent CT-guided placement of a liver abscess drain on 07/30/2015. Liver abscess culture grew out group B strep and Enterobacter which was sensitive to Zosyn. She was transitioned to ertapenem Colbert Ewing) prior to discharge.  She was readmitted to [facility] on 09/17/2015.  Chest, abdomen, and pelvic CT scan on 09/01/2015 revealed continued decrease in perihepatic fluid collection contiguous with the right pleural space with percutaneous drain. Drain was removed on 09/01/2015.  Chest, abdomen, and pelvic CT scan on 09/11/2015 revealed a residual versus recurrent fluid collection posterior to the right hepatic lobe (2.6 x 1.1 x 4.0 cm).  Chest, abdomen, and pelvic CT scan on 10/13/2015 revealed resolution of the empyema.    She has had persistent neutropenia felt secondary to her rheumatoid arthritis.  Folate and MMA were normal.  TSH was 6.13 (high) with a free T4 of 1.17 (0.61-1.12).  She began methotrexate (10 mg a week) and prednisone (5 mg a day) for severe rheumatoid arthritis on 12/17/2015.  Counts are improving.  Bone marrow aspirate and biopsy on 06/09/2010 revealed a hypercellular marrow (70%) with no evidence of dysplasia or malignancy.  Flow cytometry was negative.  Cytogenetics were normal (46,XX).  FISH studies were negative for MDS.  Bone marrow aspirate and biopsy on 12/03/2015 revealed a normocellular to mildy hypercellular marrow for age (40%) with left shifted myelopoiesis, non specific dyserythropoiesis and mild megakaryocytic atypia with no increase in blasts.  There were multiple small nonspecific lymphoid aggregates (favor reactive). There was no increase in reticulin.  There was decreased myeloid cells (37%) with left shifted maturation and 1% atypical myelod blasts.  There was relatively increased monocytic  cells (11%), relatively increased lymphoid cells (36%), and relatively increased eosinophils (6%).  Cytogenetics were normal (33, XX).  SNP microarray was normal.  Alkaline phosphatase was 348 (38-126) on 11/06/2015 and 431 on 11/17/2015.   Fractionated alkaline phosphatase on 11/09/2015 revealed 21% bone and 79% liver.  MRI of the abdomen on 12/14/2015 revealed mild extrahepatic biliary duct dilatation without obstructing lesion identified.  Symptomatically, she has a persistent grade III neuropathy.  Her WBC count is improving with treatment of her rheumatoid arthritis (ANC 400 to 1300).  Plan: 1.  Labs today:  CBC with diff, CMP, CA125. 2.  Discuss response to methotrexate and prednisone.  Discuss plan for reassessment and possible treatment in 1 month.  Will discuss with Dr. Jefm Bryant management of her counts while on chemotherapy.  Discuss steroids with chemotherapy + Neulasta. 3.  Port flush today and every 6 weeks. 4.  Chest, abdomen, pelvic CT scan 02/19/2016. 5.  RTC on 02/22/2016 for MD assessment, labs (CBC with diff, CMP, Mg, CA125), and carboplatin + gemcitabine   Lequita Asal, MD  01/22/2016, 11:51 AM

## 2016-01-22 NOTE — Progress Notes (Signed)
Janet, UMPIERRE ([id number]) Visit Report for 01/22/2016 Arrival Information Details Patient Name: [name], [name] Date of Service: 01/22/2016 9:15 AM Medical Record Number: [id number] Patient Account Number: [id number] Date of Birth/Sex: [birth year] (70 y.o. Female) Treating RN: [name] Primary Care Physician: [name], [name] Other Clinician: Referring Physician: Caryl [name], [name] Treating Physician/Extender: [name] in Treatment: 3 Visit Information History Since Last Visit Added or deleted any medications: No Patient Arrived: Ambulatory Any new allergies or adverse reactions: No Arrival Time: 09:28 Had a fall or experienced change in No Accompanied By: son, [name] activities of daily living that may affect Transfer Assistance: None risk of falls: Patient Identification Verified: Yes Signs or symptoms of abuse/neglect since last No Secondary Verification Process Yes visito Completed: Hospitalized since last visit: No Patient Has Alerts: Yes Has Dressing in Place as Prescribed: Yes Patient Alerts: Patient on Blood Pain Present Now: No Thinner lovenox BID DMII Electronic Signature(s) Signed: 01/22/2016 10:50:50 AM By: Janet Cool, RN, BSN, Kim RN, BSN Entered By: Janet Cool, RN, BSN, [name] on 01/22/2016 09:29:09 [name] ([id number]) -------------------------------------------------------------------------------- Encounter Discharge Information Details Patient Name: [name] Date of Service: 01/22/2016 9:15 AM Medical Record Number: [id number] Patient Account Number: [id number] Date of Birth/Sex: [birth year] (70 y.o. Female) Treating RN: [name] Primary Care Physician: [name], [name] Other Clinician: Referring Physician: Caryl [name], [name] Treating Physician/Extender: [name] in Treatment: 3 Encounter Discharge Information Items Discharge Pain Level: 0 Discharge Condition: Stable Ambulatory Status: Ambulatory Discharge Destination:  Home Transportation: Private Auto Accompanied By: son Schedule Follow-up Appointment: Yes Medication Reconciliation completed and provided to Patient/Care Yes Kiven Vangilder: Provided on Clinical Summary of Care: 01/22/2016 Form Type Recipient Paper Patient SR Electronic Signature(s) Signed: 01/22/2016 9:56:36 AM By: Ruthine Dose Entered By: Ruthine Dose on 01/22/2016 09:56:36 [name], [name] ([id number]) -------------------------------------------------------------------------------- Lower Extremity Assessment Details Patient Name: [name] Date of Service: 01/22/2016 9:15 AM Medical Record Number: [id number] Patient Account Number: [id number] Date of Birth/Sex: 1945-07-17 (70 y.o. Female) Treating RN: [name] Primary Care Physician: [name], [name] Other Clinician: Referring Physician: Caryl [name], [name] Treating Physician/Extender: [name] in Treatment: 3 Vascular Assessment Pulses: Posterior Tibial Palpable: [Left:Yes] Dorsalis Pedis Palpable: [Left:Yes] Extremity colors, hair growth, and conditions: Extremity Color: [Left:Normal] Temperature of Extremity: [Left:[name]] Capillary Refill: [Left:< 3 seconds] Dependent Rubor: [Left:No] Blanched when Elevated: [Left:No] Lipodermatosclerosis: [Left:No] Toe Nail Assessment Left: Right: Thick: No Discolored: No Deformed: No Improper Length and Hygiene: No Electronic Signature(s) Signed: 01/22/2016 10:50:50 AM By: Janet Cool, RN, BSN, Kim RN, BSN Entered By: Janet Cool, RN, BSN, [name] on 01/22/2016 09:35:18 [name], [name] ([id number]) -------------------------------------------------------------------------------- Multi Wound Chart Details Patient Name: [name] Date of Service: 01/22/2016 9:15 AM Medical Record Number: [id number] Patient Account Number: [id number] Date of Birth/Sex: 1945-05-08 (70 y.o. Female) Treating RN: [name] Primary Care Physician: [name], [name] Other Clinician: Referring  Physician: Caryl [name], [name] Treating Physician/Extender: [name] in Treatment: 3 Vital Signs Height(in): 63 Pulse(bpm): 80 Weight(lbs): 130 Blood Pressure 120/58 (mmHg): Body Mass Index(BMI): 23 Temperature(F): 97.7 Respiratory Rate 18 (breaths/min): Photos: [N/A:N/A] Wound Location: Left Calcaneus N/A N/A Wounding Event: Pressure Injury N/A N/A Primary Etiology: Pressure Ulcer N/A N/A Secondary Etiology: Diabetic Wound/Ulcer of N/A N/A the Lower Extremity Comorbid History: Cataracts, Type I N/A N/A Diabetes, Rheumatoid Arthritis, Osteoarthritis, Neuropathy, Received Chemotherapy Date Acquired: 07/15/2015 N/A N/A Weeks of Treatment: 3 N/A N/A Wound Status: Open N/A N/A Measurements L x W x D 0.4x0.3x0.8 N/A N/A (cm) Area (cm) : 0.094 N/A N/A Volume (cm) : 0.075 N/A N/A %  Reduction in Area: 60.20% N/A N/A % Reduction in Volume: 20.20% N/A N/A Classification: Category/Stage III N/A N/A HBO Classification: Grade 1 N/A N/A Exudate Amount: Large N/A N/A Exudate Type: Serous N/A N/A Exudate Color: amber N/A N/A [name], [name] ([id number]) Wound Margin: Flat and Intact N/A N/A Granulation Amount: None Present (0%) N/A N/A Necrotic Amount: Large (67-100%) N/A N/A Exposed Structures: Fascia: No N/A N/A Fat: No Tendon: No Muscle: No Joint: No Bone: No Limited to Skin Breakdown Epithelialization: None N/A N/A Periwound Skin Texture: Edema: No N/A N/A Excoriation: No Induration: No Callus: No Crepitus: No Fluctuance: No Friable: No Rash: No Scarring: No Periwound Skin Maceration: Yes N/A N/A Moisture: Moist: No Dry/Scaly: No Periwound Skin Color: Atrophie Blanche: No N/A N/A Cyanosis: No Ecchymosis: No Erythema: No Hemosiderin Staining: No Mottled: No Pallor: No Rubor: No Temperature: No Abnormality N/A N/A Tenderness on Yes N/A N/A Palpation: Wound Preparation: Ulcer Cleansing: N/A N/A Rinsed/Irrigated with Saline Topical  Anesthetic Applied: Other: lidocaine 4% Treatment Notes Electronic Signature(s) Signed: 01/22/2016 10:50:50 AM By: Janet Cool, RN, BSN, Kim RN, BSN Entered By: Janet Cool, RN, BSN, [name] on 01/22/2016 09:37:41 Prows, [name] ([id number]) ZHARIYAH, BARRILE ([id number]) -------------------------------------------------------------------------------- Multi-Disciplinary Care Plan Details Patient Name: [name] Date of Service: 01/22/2016 9:15 AM Medical Record Number: [id number] Patient Account Number: [id number] Date of Birth/Sex: 10-Jul-1945 (70 y.o. Female) Treating RN: [name] Primary Care Physician: [name], [name] Other Clinician: Referring Physician: Caryl [name], [name] Treating Physician/Extender: [name] in Treatment: 3 Active Inactive Orientation to the Wound Care Program Nursing Diagnoses: Knowledge deficit related to the wound healing center program Goals: Patient/caregiver will verbalize understanding of the [place] Program Date Initiated: 12/31/2015 Goal Status: Active Interventions: Provide education on orientation to the wound center Notes: Pressure Nursing Diagnoses: Knowledge deficit related to causes and risk factors for pressure ulcer development Knowledge deficit related to management of pressures ulcers Potential for impaired tissue integrity related to pressure, friction, moisture, and shear Goals: Patient will remain free from development of additional pressure ulcers Date Initiated: 12/31/2015 Goal Status: Active Patient will remain free of pressure ulcers Date Initiated: 12/31/2015 Goal Status: Active Patient/caregiver will verbalize risk factors for pressure ulcer development Date Initiated: 12/31/2015 Goal Status: Active Patient/caregiver will verbalize understanding of pressure ulcer management Date Initiated: 12/31/2015 Goal Status: Active Interventions: HANNAH, MEDSKER ([id number]) Assess: immobility, friction, shearing,  incontinence upon admission and as needed Assess offloading mechanisms upon admission and as needed Assess potential for pressure ulcer upon admission and as needed Provide education on pressure ulcers Treatment Activities: Patient referred for pressure reduction/relief devices : 12/31/2015 Notes: Wound/Skin Impairment Nursing Diagnoses: Impaired tissue integrity Knowledge deficit related to smoking impact on wound healing Knowledge deficit related to ulceration/compromised skin integrity Goals: Patient/caregiver will verbalize understanding of skin care regimen Date Initiated: 12/31/2015 Goal Status: Active Ulcer/skin breakdown will have a volume reduction of 30% by week 4 Date Initiated: 12/31/2015 Goal Status: Active Ulcer/skin breakdown will have a volume reduction of 50% by week 8 Date Initiated: 12/31/2015 Goal Status: Active Ulcer/skin breakdown will have a volume reduction of 80% by week 12 Date Initiated: 12/31/2015 Goal Status: Active Ulcer/skin breakdown will heal within 14 weeks Date Initiated: 12/31/2015 Goal Status: Active Interventions: Assess patient/caregiver ability to obtain necessary supplies Assess patient/caregiver ability to perform ulcer/skin care regimen upon admission and as needed Assess ulceration(s) every visit Provide education on ulcer and skin care Treatment Activities: Skin care regimen initiated : 12/31/2015 Topical wound management initiated : 12/31/2015 Notes: Vara,  [name] ([id number]) Electronic Signature(s) Signed: 01/22/2016 10:50:50 AM By: Janet Cool RN, BSN, Kim RN, BSN Entered By: Janet Cool, RN, BSN, [name] on 01/22/2016 09:37:31 Loyola, [name] ([id number]) -------------------------------------------------------------------------------- Pain Assessment Details Patient Name: [name] Date of Service: 01/22/2016 9:15 AM Medical Record Number: [id number] Patient Account Number: [id number] Date of Birth/Sex: [birth year] (70 y.o.  Female) Treating RN: [name] Primary Care Physician: [name], [name] Other Clinician: Referring Physician: Caryl [name], [name] Treating Physician/Extender: [name] in Treatment: 3 Active Problems Location of Pain Severity and Description of Pain Patient Has Paino No Site Locations With Dressing Change: No Pain Management and Medication Current Pain Management: Electronic Signature(s) Signed: 01/22/2016 10:50:50 AM By: Janet Cool, RN, BSN, Kim RN, BSN Entered By: Janet Cool, RN, BSN, [name] on 01/22/2016 09:29:17 [name] ([id number]) -------------------------------------------------------------------------------- Patient/Caregiver Education Details Patient Name: [name] Date of Service: 01/22/2016 9:15 AM Medical Record Number: [id number] Patient Account Number: [id number] Date of Birth/Gender: 1946/03/28 (70 y.o. Female) Treating RN: [name] Primary Care Physician: [name], [name] Other Clinician: Referring Physician: Caryl [name], [name] Treating Physician/Extender: [name] in Treatment: 3 Education Assessment Education Provided To: Patient Education Topics Provided Wound/Skin Impairment: Handouts: Caring for Your Ulcer Methods: Demonstration Responses: State content correctly Electronic Signature(s) Signed: 01/22/2016 10:50:50 AM By: Janet Cool, RN, BSN, Kim RN, BSN Entered By: Janet Cool, RN, BSN, [name] on 01/22/2016 09:49:51 Escandon, [name] ([id number]) -------------------------------------------------------------------------------- Wound Assessment Details Patient Name: [name] Date of Service: 01/22/2016 9:15 AM Medical Record Number: [id number] Patient Account Number: [id number] Date of Birth/Sex: Jul 23, 1945 (70 y.o. Female) Treating RN: [name] Primary Care Physician: [name], [name] Other Clinician: Referring Physician: Caryl [name], [name] Treating Physician/Extender: [name] in Treatment: 3 Wound Status Wound Number:  2 Primary Pressure Ulcer Etiology: Wound Location: Left Calcaneus Secondary Diabetic Wound/Ulcer of the Lower Wounding Event: Pressure Injury Etiology: Extremity Date Acquired: 07/15/2015 Wound Open Weeks Of Treatment: 3 Status: Clustered Wound: No Comorbid Cataracts, Type I Diabetes, History: Rheumatoid Arthritis, Osteoarthritis, Neuropathy, Received Chemotherapy Photos Wound Measurements Length: (cm) 0.4 Width: (cm) 0.3 Depth: (cm) 0.8 Area: (cm) 0.094 Volume: (cm) 0.075 % Reduction in Area: 60.2% % Reduction in Volume: 20.2% Epithelialization: None Tunneling: No Undermining: No Wound Description Classification: Category/Stage III Foul Odor Diabetic Severity (Wagner): Grade 1 Wound Margin: Flat and Intact Exudate Amount: Large Exudate Type: Serous Exudate Color: amber After Cleansing: No Wound Bed Granulation Amount: None Present (0%) Exposed Structure Necrotic Amount: Large (67-100%) Fascia Exposed: No Necrotic Quality: Adherent Slough Fat Layer Exposed: No Tendon Exposed: No THEORA, DEPRIMO. ([id number]) Muscle Exposed: No Joint Exposed: No Bone Exposed: No Limited to Skin Breakdown Periwound Skin Texture Texture Color No Abnormalities Noted: No No Abnormalities Noted: No Callus: No Atrophie Blanche: No Crepitus: No Cyanosis: No Excoriation: No Ecchymosis: No Fluctuance: No Erythema: No Friable: No Hemosiderin Staining: No Induration: No Mottled: No Localized Edema: No Pallor: No Rash: No Rubor: No Scarring: No Temperature / Pain Moisture Temperature: No Abnormality No Abnormalities Noted: No Tenderness on Palpation: Yes Dry / Scaly: No Maceration: Yes Moist: No Wound Preparation Ulcer Cleansing: Rinsed/Irrigated with Saline Topical Anesthetic Applied: Other: lidocaine 4%, Treatment Notes Wound #2 (Left Calcaneus) 1. Cleansed with: Clean wound with Normal Saline 2. Anesthetic Topical Lidocaine 4% cream to wound bed prior to  debridement 4. Dressing Applied: Santyl Ointment Other dressing (specify in notes) 5. Secondary Dressing Applied Gauze and Kerlix/Conform Notes netting and packing strip Electronic Signature(s) Signed: 01/22/2016 10:50:50 AM By: Janet Cool, RN, BSN, Kim RN, BSN Entered By: Janet Cool, RN,  BSN, [name] on 01/22/2016 09:37:05 Cossey, [name] ([id number]) -------------------------------------------------------------------------------- Vitals Details Patient Name: [name] Date of Service: 01/22/2016 9:15 AM Medical Record Number: [id number] Patient Account Number: [id number] Date of Birth/Sex: [birth year] (70 y.o. Female) Treating RN: [name] Primary Care Physician: [name], [name] Other Clinician: Referring Physician: Caryl [name], [name] Treating Physician/Extender: [name] in Treatment: 3 Vital Signs Time Taken: 09:31 Temperature (F): 97.7 Height (in): 63 Pulse (bpm): 80 Weight (lbs): 130 Respiratory Rate (breaths/min): 18 Body Mass Index (BMI): 23 Blood Pressure (mmHg): 120/58 Reference Range: 80 - 120 mg / dl Electronic Signature(s) Signed: 01/22/2016 10:50:50 AM By: Janet Cool, RN, BSN, Kim RN, BSN Entered By: Janet Cool, RN, BSN, [name] on 01/22/2016 09:31:20

## 2016-01-22 NOTE — Progress Notes (Signed)
Patient here lab results. Patient states "she is feeling better".

## 2016-01-23 LAB — CA 125: CA 125: 49 U/mL — ABNORMAL HIGH (ref 0.0–38.1)

## 2016-01-23 NOTE — Progress Notes (Signed)
GRETCHEN, MIGLIORI ([id number]) Visit Report for 01/22/2016 Chief Complaint Document Details Patient Name: [name], [name] Date of Service: 01/22/2016 9:15 AM Medical Record Number: [id number] Patient Account Number: [id number] Date of Birth/Sex: Feb 06, 1946 (70 y.o. Female) Treating RN: Cornell Barman Primary Care Physician: Caryl Bis, ERIC Other Clinician: Referring Physician: Caryl Bis, ERIC Treating Physician/Extender: Frann Rider in Treatment: 3 Information Obtained from: Patient Chief Complaint Patient is at the clinic for treatment of an open pressure ulcer to the left heel which she has for over 5 months Electronic Signature(s) Signed: 01/22/2016 9:49:45 AM By: Christin Fudge MD, FACS Entered By: Christin Fudge on 01/22/2016 09:49:45 [name] ([id number]) -------------------------------------------------------------------------------- Debridement Details Patient Name: [name] Date of Service: 01/22/2016 9:15 AM Medical Record Number: [id number] Patient Account Number: [id number] Date of Birth/Sex: [birth year] (70 y.o. Female) Treating RN: Cornell Barman Primary Care Physician: Caryl Bis, ERIC Other Clinician: Referring Physician: Caryl Bis, ERIC Treating Physician/Extender: Frann Rider in Treatment: 3 Debridement Performed for Wound #2 Left Calcaneus Assessment: Performed By: Physician Christin Fudge, MD Debridement: Debridement Pre-procedure Yes - 09:40 Verification/Time Out Taken: Start Time: 09:41 Pain Control: Other : lidocaine 4% Level: Skin/Subcutaneous Tissue Total Area Debrided (L x 0.4 (cm) x 0.3 (cm) = 0.12 (cm) W): Tissue and other Viable, Exudate, Fat, Fibrin/Slough, Subcutaneous material debrided: Instrument: Forceps Bleeding: Moderate Hemostasis Achieved: Pressure End Time: 09:45 Procedural Pain: 0 Post Procedural Pain: 0 Response to Treatment: Procedure was tolerated well Post Debridement Measurements of Total  Wound Length: (cm) 0.4 Stage: Category/Stage III Width: (cm) 0.3 Depth: (cm) 0.8 Volume: (cm) 0.075 Character of Wound/Ulcer Post Requires Further Debridement: Debridement Severity of Tissue Post Fat layer exposed Debridement: Post Procedure Diagnosis Same as Pre-procedure Electronic Signature(s) Signed: 01/22/2016 9:49:39 AM By: Christin Fudge MD, FACS Signed: 01/22/2016 10:50:50 AM By: Gretta Cool, RN, BSN, Kim RN, BSN Entered By: Christin Fudge on 01/22/2016 09:49:38 [name], [name] ([id number] Mainland, [name] ([id number]) -------------------------------------------------------------------------------- HPI Details Patient Name: [name] Date of Service: 01/22/2016 9:15 AM Medical Record Number: [id number] Patient Account Number: [id number] Date of Birth/Sex: 01/16/1946 (70 y.o. Female) Treating RN: Cornell Barman Primary Care Physician: Caryl Bis, ERIC Other Clinician: Referring Physician: Caryl Bis, ERIC Treating Physician/Extender: Frann Rider in Treatment: 3 History of Present Illness Location: ulcerated on the right heel Quality: Patient reports experiencing a dull pain to affected area(s). Severity: Patient states wound are getting worse. Duration: Patient has had the wound for <5 months prior to presenting for treatment Timing: Pain in wound is Intermittent (comes and goes Context: The wound appeared gradually over time Modifying Factors: Consults to this date include: local care was done as per the medication given by the PCP which may have been medihoney Associated Signs and Symptoms: Patient reports having difficulty standing for long periods. 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 In the past she was seen for a decubitus ulcer on her right gluteal area.  She is known to have diabetes mellitus type 1 and her most recent A1c was 7.2% Past medical history significant for leukopenia, cholelithiasis, hypertension, chronic diastolic CHF, coronary artery disease,type 1 diabetes mellitus, rheumatoid arthritis, collagen vascular disease, ovarian cancer, status post CABG in 2014 and status post Port-A-Cath insertion by Dr. Leotis Pain in October 2016. She has quit smoking about 20 years ago. She was advised to use Medihoney with  calcium alginate pads to be applied over the wound. 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 01/07/2016 -- x-ray of the left heel done -- IMPRESSION: No radiographic evidence of osteomyelitis. If this remains a clinical concern, recommend MR imaging. Electronic Signature(s) Signed: 01/22/2016 9:49:51 AM By: Christin Fudge MD, FACS Entered By: Christin Fudge on 01/22/2016 09:49:51 [name] ([id number]) -------------------------------------------------------------------------------- Physical Exam Details Patient Name: [name] Date of Service: 01/22/2016 9:15 AM Medical Record Number: [id number] Patient Account Number: [id number] Date of Birth/Sex: [birth year] (70 y.o. Female) Treating RN: Cornell Barman Primary Care Physician: Caryl Bis, ERIC Other Clinician: Referring Physician: Caryl Bis, ERIC Treating Physician/Extender: Frann Rider in Treatment: 3 Constitutional . Pulse regular. Respirations normal and unlabored. Afebrile. . Eyes Nonicteric. Reactive to light. Ears, Nose, Mouth, and Throat Lips, teeth, and gums WNL.Marland Kitchen Moist mucosa without lesions. Neck supple and nontender. No palpable  supraclavicular or cervical adenopathy. Normal sized without goiter. Respiratory WNL. No retractions.. Cardiovascular Pedal Pulses WNL. No clubbing, cyanosis or edema. Lymphatic No adneopathy. No adenopathy. No adenopathy. Musculoskeletal Adexa without tenderness or enlargement.. Digits and nails w/o clubbing, cyanosis, infection, petechiae, ischemia, or inflammatory conditions.. Integumentary (Hair, Skin) No suspicious lesions. No crepitus or fluctuance. No peri-wound warmth or erythema. No masses.Marland Kitchen Psychiatric Judgement and insight Intact.. No evidence of depression, anxiety, or agitation.. Notes I removed the subcutis debris sharply with a forcep and overall is looking much cleaner. I cannot probe down to bone. Electronic Signature(s) Signed: 01/22/2016 9:50:34 AM By: Christin Fudge MD, FACS Entered By: Christin Fudge on 01/22/2016 09:50:34 [name] ([id number]) -------------------------------------------------------------------------------- Physician Orders Details Patient Name: [name] Date of Service: 01/22/2016 9:15 AM Medical Record Number: [id number] Patient Account Number: [id number] Date of Birth/Sex: 01/17/1946 (70 y.o. Female) Treating RN: Cornell Barman Primary Care Physician: Caryl Bis, ERIC Other Clinician: Referring Physician: Caryl Bis, ERIC Treating Physician/Extender: Frann Rider in Treatment: 3 Verbal / Phone Orders: Yes Clinician: Cornell Barman Read Back and Verified: Yes Diagnosis Coding Wound Cleansing Wound #2 Left Calcaneus o Cleanse wound with mild soap and water o May Shower, gently pat wound dry prior to applying new dressing. Anesthetic Wound #2 Left Calcaneus o Topical Lidocaine 4% cream applied to wound bed prior to debridement Primary Wound Dressing Wound #2 Left Calcaneus o Santyl Ointment - on packing strip o Medihoney gel - Use if you cannot afford SAntyl Secondary Dressing Wound #2 Left Calcaneus o Gauze  and Kerlix/Conform Dressing Change Frequency Wound #2 Left Calcaneus o Change dressing every day. Follow-up Appointments Wound #2 Left Calcaneus o Return Appointment in 1 week. Off-Loading Wound #2 Left Calcaneus o Heel suspension boot to: - ELevate on pillows Additional Orders / Instructions Wound #2 Left Calcaneus o Increase protein intake. o Activity as tolerated o Other: - INclude Vitamin A, C, ZInc, MVI in diet [name], [name]. ([id number]) Medications-please add to medication list. Wound #2 Left Calcaneus o Santyl Enzymatic Ointment Electronic Signature(s) Signed: 01/22/2016 10:50:50 AM By: Gretta Cool RN, BSN, Kim RN, BSN Signed: 01/22/2016 3:52:17 PM By: Christin Fudge MD, FACS Entered By: Gretta Cool RN, BSN, Kim on 01/22/2016 09:48:43 [name], [name] ([id number]) -------------------------------------------------------------------------------- Problem List Details Patient Name: [name] Date of Service: 01/22/2016 9:15 AM Medical  Record Number: [id number] Patient Account Number: [id number] Date of Birth/Sex: 09/18/1945 (70 y.o. Female) Treating RN: Cornell Barman Primary Care Physician: Caryl Bis, ERIC Other Clinician: Referring Physician: Caryl Bis, ERIC Treating Physician/Extender: Frann Rider in Treatment: 3 Active Problems ICD-10 Encounter Code Description Active Date Diagnosis E10.621 Type 1 diabetes mellitus with foot ulcer 12/31/2015 Yes L89.623 Pressure ulcer of left heel, stage 3 12/31/2015 Yes Z92.21 Personal history of antineoplastic chemotherapy 12/31/2015 Yes Z79.52 Long term (current) use of systemic steroids 12/31/2015 Yes Inactive Problems Resolved Problems Electronic Signature(s) Signed: 01/22/2016 9:49:28 AM By: Christin Fudge MD, FACS Entered By: Christin Fudge on 01/22/2016 09:49:28 [name] ([id number]) -------------------------------------------------------------------------------- Progress Note Details Patient Name:  [name] Date of Service: 01/22/2016 9:15 AM Medical Record Number: [id number] Patient Account Number: [id number] Date of Birth/Sex: January 13, 1946 (70 y.o. Female) Treating RN: Cornell Barman Primary Care Physician: Caryl Bis, ERIC Other Clinician: Referring Physician: Caryl Bis, ERIC Treating Physician/Extender: Frann Rider in Treatment: 3 Subjective Chief Complaint Information obtained from Patient Patient is at the clinic for treatment of an open pressure ulcer to the left heel which she has for over 5 months History of Present Illness (HPI) The following HPI elements were documented for the patient's wound: Location: ulcerated on the right heel Quality: Patient reports experiencing a dull pain to affected area(s). Severity: Patient states wound are getting worse. Duration: Patient has had the wound for Timing: Pain in wound is Intermittent (comes and goes Context: The wound appeared gradually over time Modifying Factors: Consults to this date include: local care was done as per the medication given by the PCP which may have been medihoney Associated Signs and Symptoms: Patient reports having difficulty standing for long periods. 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 In the past she was seen for a decubitus ulcer on her right gluteal area. She is known to have diabetes mellitus type 1 and her most recent A1c was 7.2% Past medical history significant for leukopenia, cholelithiasis, hypertension, chronic diastolic CHF, coronary artery disease,type 1 diabetes mellitus, rheumatoid arthritis, collagen vascular disease, ovarian cancer, status post CABG in 2014 and status post Port-A-Cath insertion by Dr. Leotis Pain in October 2016. She has quit smoking about 20 years ago. She was advised to use  Medihoney with calcium alginate pads to be applied over the wound. 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 01/07/2016 -- x-ray of the left heel done -- IMPRESSION: No radiographic evidence of osteomyelitis. If this remains a clinical concern, recommend MR imaging. [name], [name] ([id number]) Objective Constitutional Pulse regular. Respirations normal and unlabored. Afebrile. Vitals Time Taken: 9:31 AM, Height: 63 in, Weight: 130 lbs, BMI: 23, Temperature: 97.7 F, Pulse: 80 bpm, Respiratory Rate: 18 breaths/min, Blood Pressure: 120/58 mmHg. Eyes Nonicteric. Reactive to light. Ears, Nose, Mouth, and Throat Lips, teeth, and gums WNL.Marland Kitchen Moist mucosa without lesions. Neck supple and nontender. No palpable supraclavicular or cervical adenopathy. Normal sized without goiter. Respiratory WNL. No retractions.. Cardiovascular Pedal Pulses WNL. No clubbing, cyanosis or edema. Lymphatic No adneopathy. No adenopathy. No adenopathy.  Musculoskeletal Adexa without tenderness or enlargement.. Digits and nails w/o clubbing, cyanosis, infection, petechiae, ischemia, or inflammatory conditions.Marland Kitchen Psychiatric Judgement and insight Intact.. No evidence of depression, anxiety, or agitation.. General Notes: I removed the subcutis debris sharply with a forcep and overall is looking much cleaner. I cannot probe down to bone. Integumentary (Hair, Skin) No suspicious lesions. No crepitus or fluctuance. No peri-wound warmth or erythema. No masses.. Wound #2 status is Open. Original cause of wound was Pressure Injury. The wound is located on the Left Calcaneus. The wound measures 0.4cm length x 0.3cm width  x 0.8cm depth; 0.094cm^2 area and 0.075cm^3 volume. The wound is limited to skin breakdown. There is no tunneling or undermining noted. There is a large amount of serous drainage noted. The wound margin is flat and intact. There is no granulation within the wound bed. There is a large (67-100%) amount of necrotic tissue within the wound bed including Adherent Slough. The periwound skin appearance exhibited: Maceration. The periwound skin [name], [name]. ([id number]) appearance did not exhibit: Callus, Crepitus, Excoriation, Fluctuance, Friable, Induration, Localized Edema, Rash, Scarring, Dry/Scaly, Moist, Atrophie Blanche, Cyanosis, Ecchymosis, Hemosiderin Staining, Mottled, Pallor, Rubor, Erythema. Periwound temperature was noted as No Abnormality. The periwound has tenderness on palpation. Assessment Active Problems ICD-10 E10.621 - Type 1 diabetes mellitus with foot ulcer L89.623 - Pressure ulcer of left heel, stage 3 Z92.21 - Personal history of antineoplastic chemotherapy Z79.52 - Long term (current) use of systemic steroids Procedures Wound #2 Wound #2 is a Pressure Ulcer located on the Left Calcaneus . There was a Skin/Subcutaneous Tissue Debridement [id number]) debridement with total area of 0.12 sq cm performed by Christin Fudge, MD. with the following instrument(s): Forceps to remove Viable tissue/material including Exudate, Fat, Fibrin/Slough, and Subcutaneous after achieving pain control using Other (lidocaine 4%). A time out was conducted at 09:40, prior to the start of the procedure. A Moderate amount of bleeding was controlled with Pressure. The procedure was tolerated well with a pain level of 0 throughout and a pain level of 0 following the procedure. Post Debridement Measurements: 0.4cm length x 0.3cm width x 0.8cm depth; 0.075cm^3 volume. Post debridement Stage noted as Category/Stage III. Character of Wound/Ulcer Post Debridement requires further debridement.  Severity of Tissue Post Debridement is: Fat layer exposed. Post procedure Diagnosis Wound #2: Same as Pre-Procedure Plan Wound Cleansing: Wound #2 Left Calcaneus: Cleanse wound with mild soap and water May Shower, gently pat wound dry prior to applying new dressing. [name], [name] ([id number]) Anesthetic: Wound #2 Left Calcaneus: Topical Lidocaine 4% cream applied to wound bed prior to debridement Primary Wound Dressing: Wound #2 Left Calcaneus: Santyl Ointment - on packing strip Medihoney gel - Use if you cannot afford SAntyl Secondary Dressing: Wound #2 Left Calcaneus: Gauze and Kerlix/Conform Dressing Change Frequency: Wound #2 Left Calcaneus: Change dressing every day. Follow-up Appointments: Wound #2 Left Calcaneus: Return Appointment in 1 week. Off-Loading: Wound #2 Left Calcaneus: Heel suspension boot to: - ELevate on pillows Additional Orders / Instructions: Wound #2 Left Calcaneus: Increase protein intake. Activity as tolerated Other: - INclude Vitamin A, C, ZInc, MVI in diet Medications-please add to medication list.: Wound #2 Left Calcaneus: Santyl Enzymatic Ointment I have recommended: 1. Off-Loading as much as possible and using proper footwear. 2. Santyl ointment to the wound packed with plain packing gauze, and dressings to be changed daily 3. Continue good control of her diabetes mellitus 4. Adequate protein, vitamin C, zinc Electronic Signature(s) Signed: 01/22/2016 9:50:56 AM By: Christin Fudge MD,  FACS Entered By: Christin Fudge on 01/22/2016 09:50:56 [name] ([id number]) -------------------------------------------------------------------------------- SuperBill Details Patient Name: [name] Date of Service: 01/22/2016 Medical Record Number: [id number] Patient Account Number: [id number] Date of Birth/Sex: 1945/09/25 (70 y.o. Female) Treating RN: Cornell Barman Primary Care Physician: Caryl Bis, ERIC Other Clinician: Referring  Physician: Caryl Bis, ERIC Treating Physician/Extender: Frann Rider in Treatment: 3 Diagnosis Coding ICD-10 Codes Code Description E10.621 Type 1 diabetes mellitus with foot ulcer L89.623 Pressure ulcer of left heel, stage 3 Z92.21 Personal history of antineoplastic chemotherapy Z79.52 Long term (current) use of systemic steroids Facility Procedures CPT4 Code: [id number] Description: [id number] - DEB SUBQ TISSUE 20 SQ CM/< ICD-10 Description Diagnosis E10.621 Type 1 diabetes mellitus with foot ulcer L89.623 Pressure ulcer of left heel, stage 3 Z92.21 Personal history of antineoplastic chemothera Z79.52 Long term (current) use  of systemic steroids Modifier: py Quantity: 1 Physician Procedures CPT4 Code: [id number] Description: [id number] - WC PHYS SUBQ TISS 20 SQ CM ICD-10 Description Diagnosis E10.621 Type 1 diabetes mellitus with foot ulcer L89.623 Pressure ulcer of left heel, stage 3 Z92.21 Personal history of antineoplastic chemothera Z79.52 Long term (current) use  of systemic steroids Modifier: py Quantity: 1 Electronic Signature(s) Signed: 01/22/2016 10:27:35 AM By: Christin Fudge MD, FACS Entered By: Christin Fudge on 01/22/2016 10:27:34

## 2016-01-24 ENCOUNTER — Encounter: Payer: Self-pay | Admitting: Hematology and Oncology

## 2016-01-25 ENCOUNTER — Telehealth: Payer: Self-pay

## 2016-01-25 NOTE — Telephone Encounter (Signed)
  Oncology Nurse Navigator Documentation Received message from scheduling that Ms Lacki did not think she needed to keep appt with Dr Theora Gianotti 9/27. Unless she was told anything different, she was last seen 6/27 by Gyn Onc and needs to be seen every 3 months. Voicemail left with her regarding. Navigator Location: [facility] (01/25/16 1100) Navigator Encounter Type: Telephone;Follow-up Appt (01/25/16 1100) Telephone: Outgoing Call;Appt Confirmation/Clarification (01/25/16 1100)                                        Time Spent with Patient: 15 (01/25/16 1100)

## 2016-01-27 ENCOUNTER — Inpatient Hospital Stay (HOSPITAL_BASED_OUTPATIENT_CLINIC_OR_DEPARTMENT_OTHER): Payer: Medicare Other | Admitting: Obstetrics and Gynecology

## 2016-01-27 ENCOUNTER — Encounter: Payer: Self-pay | Admitting: Obstetrics and Gynecology

## 2016-01-27 VITALS — BP 101/59 | HR 85 | Temp 97.4°F | Ht 63.0 in | Wt 132.8 lb

## 2016-01-27 DIAGNOSIS — C569 Malignant neoplasm of unspecified ovary: Secondary | ICD-10-CM

## 2016-01-27 DIAGNOSIS — C786 Secondary malignant neoplasm of retroperitoneum and peritoneum: Secondary | ICD-10-CM | POA: Diagnosis not present

## 2016-01-27 DIAGNOSIS — Z90722 Acquired absence of ovaries, bilateral: Secondary | ICD-10-CM

## 2016-01-27 DIAGNOSIS — Z9221 Personal history of antineoplastic chemotherapy: Secondary | ICD-10-CM

## 2016-01-27 DIAGNOSIS — Z90710 Acquired absence of both cervix and uterus: Secondary | ICD-10-CM

## 2016-01-27 DIAGNOSIS — Z7189 Other specified counseling: Secondary | ICD-10-CM

## 2016-01-27 NOTE — Progress Notes (Signed)
  Oncology Nurse Navigator Documentation Chaperoned pelvic exam. Follow up in 3 months  Navigator Location: [facility] (01/27/16 0900) Navigator Encounter Type: Follow-up Appt (01/27/16 0900)                                          Time Spent with Patient: 15 (01/27/16 0900)

## 2016-01-27 NOTE — Progress Notes (Signed)
Gynecologic Oncology Consult Visit   Referring Provider: Lequita Asal, MD  Additional Care Provider: Dr. Harvest Dark.  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    Subjective:  [name] is a 70 y.o. 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  She was seen by Dr. Mike Gip on 01/22/2016 and her exam was negative for disease. Chemotherapy has been held due to RA associated neutropenia. She has a CT scan scheduled for 02/15/2016.   Lab Results  Component Value Date   CA125 49.0 (H) 01/22/2016   CA125 20.7 11/06/2015   CA125 34.5 09/17/2015     HPI: The patient presented to the emergency room at [facility] on 02/13/2015 with a 2-3 week history of diffuse abdominal discomfort and bloating.    Abdomen and pelvic CT scan on 02/13/2015 revealed bilateral mass-like adnexal regions. The right adnexal region measured approximately 5.6 x 5.0 cm posterior to the uterus, and the left adnexa demonstrated a large mass measuring approximately 10.3 x 6.0 x 9.9 cm, with heterogeneous internal areas of enhancement and cystic change. This appeared intimately associated with the adjacent proximal sigmoid colon. There was a large amount of soft tissue throughout the peritoneal cavity involving the omentum and other peritoneal surfaces, compatible with widespread intraperitoneal metastatic disease. There was a small volume of presumably malignant ascites. There was a peripheral low-attenuation lesion overlying the right lobe of the liver measuring approximately 3.3 x 1.9 cm, favored to represent a serosal implant. There was a similar appearing 1.4 x 2.2 cm ill-defined peripheral lesion within the inferior aspect of segment 6 adjacent to the ampulla in the duodenum. There was no lytic or blastic lesions. She had  a chronic T12 compression fracture.  She was seen in consultation from Dr. Mike Gip for bilateral ovarian masses with associated peritoneal metastatic disease.   02/23/2015 CT biopsy high grade serous carcinoma.  Decision was made for neoadjuvant chemotherapy with carboplatin/taxol. She has had 4 cycles of carboplatin (AUC 5) /taxol (175 mg per sq M) with Dr Mike Gip, last 12/19.  CA125 dropped from 707 in 10/16 to 168.8 in 05/15/2015.   She stopped MTX and Enbarel she had been taking for rheumatoid arthritis while on chemotherapy and joint symptoms have been in good control.  Uses Prednisone 45m qd prn and has taken this the last three days.   CT scan 04/27/16 Heterogeneous left adnexal mass measures approximately 4.0 x 6.1 cm, previously approximately 5.0 x 8.5 cm when remeasured. Heterogeneous right ovary measures approximately 3.8 x 4.9 cm, roughly 4.7 x 5.6 cm on the prior exam. Uterus is visualized. Other: Omental caking has improved in the interval but has not fully resolved. Residual scattered areas of omental nodularity and thickening persist. Small ascites, decreased.   Hepatobiliary: Low-attenuation lesion in the periphery of the hepatic dome measures 3.1 cm, stable. A lesion previously seen off the posterior right hepatic lobe is not readily visualized. There is a small amount of fluid in Morison's pouch. Numerous stones are seen in the gallbladder. No biliary ductal dilatation.  IMPRESSION: 1. Interval response to therapy as evidenced by decrease in size of bilateral ovarian masses and improved, but not resolved, peritoneal carcinomatosis. 2. Hepatic dome lesion is unchanged. Previously seen right hepatic lobe lesion is not well-visualized. 3. Small ascites and small left pleural effusion. Trace right pleural effusion. 4. Cholelithiasis. 5. Nodular  lesion at the ampulla of Vater, extending into the duodenum, stable.  On 06/15/2015 she underwent diagnostic laparoscopy, exploratory  laparotomy, lysis of adhesions (including enterolysis for > 45 minutes), total abdominal hysterectomy with bilateral salpingo-oophorectomy, infracolic omentectomy, optimal tumor debulking, recto-sigmoid resection with creation of end colostomy, mobilization of the splenic flexure  mobilization of the liver with diaphragmatic stripping and resection of diaphragm due to disease, repair of diaphragmatic defect and cholecystectomy. Her initial postoperative course was unremarkable.   PATHOLOGY: DIAGNOSIS  A. Uterus, bilateral ovaries and fallopian tubes, hysterectomy and bilateral salpingo-oophorectomy:  Left ovary: Residual high grade serous carcinoma, 2.5 cm. Right ovary: Residual high grade serous carcinoma, 1.5 cm.  Right and left fallopian tubes, free of tumor. See comment.  Uterus:  Residual high grade serous carcinoma involving uterine serosa and myometrium. Endometrium: Atrophic endometrium Cervix: No pathologic diagnosis.   See synoptic report for additional details.  Comment: Immunostains performed on block A17 ( right tube) show a small segment of p53 positive tubal epithelium. However, this focus of epithelium is cytological unremarkable and shows no increase in Ki-67 labelling or P16 staining. It is therefore classified as a p53 signature lesion rather than a serous tubal intraepithelial carcinoma.    B. Rectomsigmoid colon and partial left ovary, resection:   Residual high grade serous carcinoma involving colonic serosa and subserosa, fallopian tube and ovary.   C. Omentum, omentectomy:   Residual high grade serous carcinoma, tumor size > 2 cm.   D. Perihepatic tumor, excision:  Residual high grade serous carcinoma.   E. Gallbladder, cholecystectomy:  Chronic cholecystitis and cholelithiasis. Negative for malignancy.   F. Diaphragm, excision:  Residual high grade serous carcinoma, > 2 cm  G. Omentum #2, excision:   Residual high grade  serous carcinoma.     She developed dyspnea and was admitted twice to [facility]. She was admitted to [facility] on 07/01/2015 and 07/07/2015 for recurrent shortness of breath.  She has undergone 2 additional thoracentesis (1.1 L on 07/02/2015 and 850 cc on 07/08/2015).  Cytology was negative x 2.  Bilateral lower extremity duplex on 07/03/2015 was negative.  Echo on 07/08/2015 revealed en EF of 55-60%.    She was admitted to [facility] from 07/29/2015 - 08/10/2015 with a right-sided empyema and perihepatic abscess. She underwent CT-guided placement of a liver abscess drain on 07/30/2015. Liver abscess culture grew out group B strep and Enterobacter which was sensitive to Zosyn. She was transitioned to ertapenem Colbert Ewing) prior to discharge.  She was readmitted to [facility] on 09/17/2015.  Chest, abdomen, and pelvic CT scan on 09/01/2015 revealed continued decrease in perihepatic fluid collection contiguous with the right pleural space with percutaneous drain. Drain was removed on 09/01/2015.  Chest, abdomen, and pelvic CT scan on 09/11/2015 revealed a residual versus recurrent fluid collection posterior to the right hepatic lobe (2.6 x 1.1 x 4.0 cm).  Chest, abdomen, and pelvic CT scan on 10/13/2015 revealed resolution of the empyema.    Genetic testing: MyRisk testing negative for APC, ATM, BMPR1A, BRCA1, BRCA2, BRIP1, CDH1, CDK4, CDKN2A, CHEK2, EPCAM, MLH1, MSH2, MSH6, MUTYH, NBN, PALB2, PMS2, PTEN, RAD51C, RAD51D, SMAD4, STK11, TP53. Sequencing for select regions of POLE and POLD1, and large rearrangement analysis for select regions of GREM1 were negative.   Of note she has severe rheumatoid arthritis with persistent neutropenia; diabetes and is on an insulin pump; near occlusive thrombus within the central portion of the right internal jugular vein and central portion of the right subclavian vein on Lovenox; and persistent  grade III neuropathy.     Problem List: Patient Active Problem List   Diagnosis Date Noted   . Heel ulcer ([place]) 12/09/2015  . Bile duct abnormality 12/09/2015  . Diabetic ulcer of left heel associated with type 1 diabetes mellitus ([place]) 12/07/2015  . Alkaline phosphatase elevation 11/17/2015  . Leukopenia 11/06/2015  . Weight loss 09/17/2015  . Liver abscess 09/03/2015  . Thrombosis of right internal jugular vein ([place]) 09/03/2015  . Fatigue 07/17/2015  . Recurrent pleural effusion on right 07/01/2015  . Decubitus ulcer 05/19/2015  . Hyponatremia 05/15/2015  . Neuropathy ([place]) 05/15/2015  . Ovarian cancer ([place]) 05/06/2015  . Bilateral lower extremity edema 03/30/2015  . Hypomagnesemia 03/05/2015  . Diarrhea 02/26/2015  . Peritoneal carcinomatosis ([place]) 02/17/2015  . Anemia 02/17/2015  . Adnexal mass 02/13/2015  . Angina pectoris associated with type 2 diabetes mellitus ([place]) 02/10/2015  . History of esophageal stricture 07/08/2014  . Dysphagia, pharyngoesophageal phase 07/08/2014  . Candida esophagitis ([place]) 05/15/2014  . Cataracts, bilateral 01/13/2014  . Medicare annual wellness visit, subsequent 09/20/2013  . CAD (coronary artery disease) of artery bypass graft 11/05/2012  . Anemia, iron deficiency 10/09/2012  . CAD (coronary artery disease) 10/01/2012  . Hearing loss 07/06/2011  . Diabetes type 1, controlled ([place]) 07/06/2011  . Gastroesophageal reflux disease with stricture 07/06/2011  . GERD (gastroesophageal reflux disease) 04/05/2011  . Osteoporosis, unspecified 04/05/2011  . Hypertension 01/10/2011  . Hyperlipidemia 01/10/2011  . RA (rheumatoid arthritis) ([place]) 01/10/2011   Past Medical History: Past Medical History:  Diagnosis Date  . CAD (coronary artery disease)    a. 09/2012 Cath: LM nl, LAD 95p, 21m LCX 985mOM2 50, RCA 100.  . Marland Kitchenholelithiasis   . Chronic diastolic CHF (congestive heart failure) ([facility]  . Collagen vascular disease ([facility]  . Esophageal stricture   . Exertional shortness of breath   . GERD (gastroesophageal reflux disease)   . H/O  hiatal hernia   . Herniated disc   . History of pancytopenia   . Hyperlipidemia   . Hypertension   . Hypokalemia   . Leukopenia 2012   s/p bone marrow biopsy, Dr. PaMa Hillock. NSTEMI (non-ST elevated myocardial infarction) ([facility]5/2014   "mild" (10/18/2012)  . Ovarian cancer ([facility]  . Pneumonia 2013; 08/2012   "one lung; double" (10/18/2012)  . Rheumatoid arthritis(714.0)   . Type I diabetes mellitus ([facility]   "dx'd in 1957" (10/18/2012)    Past Surgical History: Past Surgical History:  Procedure Laterality Date  . ABDOMINAL HYSTERECTOMY  06/15/2015   Dx L/S, EXLAP TAH BSO omentectomy RSRx colostomy diaphragm resection stripping  . CARDIAC CATHETERIZATION  10/18/2012   "first one was today" (10/18/2012)  . CATARACT EXTRACTION W/ INTRAOCULAR LENS IMPLANT Right 2010  . CHOLECYSTECTOMY  06/15/2015   combined case with ovarian cancer debulking  . COLON SURGERY    . CORONARY ARTERY BYPASS GRAFT N/A 10/19/2012   Procedure: CORONARY ARTERY BYPASS GRAFTING (CABG);  Surgeon: StMelrose NakayamaMD;  Location: [facility] Service: Open Heart Surgery;  Laterality: N/A;  . ESOPHAGEAL DILATION     "3 or 4 times" (10/19/2011)  . ESOPHAGOGASTRODUODENOSCOPY  2012   Dr. IfMinna Merritts. OSTOMY    . OVARY SURGERY     removal  . PERIPHERAL VASCULAR CATHETERIZATION N/A 03/02/2015   Procedure: PoGlori Luisath Insertion;  Surgeon: JaAlgernon HuxleyMD;  [facility];  Service: Cardiovascular;  Laterality: N/A;  . [facility]  Past Gynecologic History:  Postmenopausal She has not had a GYN evaluation since age 25.    OB History:  G3P3  1. SVD - neonatal death 2. SVD - singleton 3. SVD - twins  Family History: Family History  Problem Relation Age of Onset  . Diabetes Mother   . Arthritis Mother   . Diabetes Father   . Arthritis Father   . Bone cancer Sister     Social History: Social History   Social History  . Marital status: Married    Spouse name: N/A  . Number of  children: 3  . Years of education: N/A   Occupational History  . Retired Retired   Social History Main Topics  . Smoking status: Former Smoker    Packs/day: 1.00    Years: 30.00    Types: Cigarettes    Quit date: 06/11/1994  . Smokeless tobacco: Never Used  . Alcohol use No  . Drug use: No  . Sexual activity: No   Other Topics Concern  . Not on file   Social History Narrative  . No narrative on file    Allergies: Allergies  Allergen Reactions  . Codeine Nausea And Vomiting  . Latex Rash    Current Medications: Current Outpatient Prescriptions  Medication Sig Dispense Refill  . Calcium-Vitamin D 600-200 MG-UNIT tablet Take 2 tablets by mouth daily.     Marland Kitchen enoxaparin (LOVENOX) 60 MG/0.6ML injection Inject 60 mg into the skin. Held last pm and this am dose for bone marrow biopsy    . folic acid (FOLVITE) 1 MG tablet TAKE 1 TABLET BY MOUTH ONCE A DAY 90 tablet 3  . furosemide (LASIX) 80 MG tablet Take 80 mg by mouth daily as needed.     . Insulin Human (INSULIN PUMP) SOLN Pt uses Humalog insulin.    Marland Kitchen insulin lispro (HUMALOG) 100 UNIT/ML injection Inject 0.06 mLs (6 Units total) into the skin daily. 30 mL 3  . lidocaine-prilocaine (EMLA) cream Apply 1 application topically as needed (prior to accessing port).    . loratadine (CLARITIN) 10 MG tablet Take 1 tablet by mouth daily as needed.    Marland Kitchen losartan (COZAAR) 100 MG tablet TAKE 1 TABLET BY MOUTH ONCE A DAY 90 tablet 1  . methotrexate (RHEUMATREX) 2.5 MG tablet Take by mouth.    . metoprolol tartrate (LOPRESSOR) 25 MG tablet TAKE 1 TABLET BY MOUTH TWICE A DAY AS NEEDED 180 tablet 3  . ondansetron (ZOFRAN) 8 MG tablet Take 1 tablet (8 mg total) by mouth every 8 (eight) hours as needed for nausea or vomiting. 20 tablet 1  . ONE TOUCH ULTRA TEST test strip     . oxyCODONE (OXY IR/ROXICODONE) 5 MG immediate release tablet Take 5 mg by mouth every 6 (six) hours as needed. Reported on 11/17/2015    . pantoprazole (PROTONIX) 40 MG  tablet TAKE 1 TABLET BY MOUTH TWICE A DAY 180 tablet 1  . potassium chloride SA (K-DUR,KLOR-CON) 20 MEQ tablet Take 2 tablets (40 mEq total) by mouth daily. 60 tablet 3  . predniSONE (DELTASONE) 5 MG tablet Take 5 mg by mouth as needed. Reported on 09/17/2015    . ranitidine (ZANTAC) 150 MG tablet Take 1 tablet (150 mg total) by mouth 2 (two) times daily. (Patient taking differently: Take 150 mg by mouth 2 (two) times daily as needed. ) 180 tablet 2  . vitamin C (ASCORBIC ACID) 500 MG tablet Take 500 mg by mouth daily.    Marland Kitchen  zinc gluconate 50 MG tablet Take 50 mg by mouth daily.     No current facility-administered medications for this visit.    Facility-Administered Medications Ordered in Other Visits  Medication Dose Route Frequency Provider Last Rate Last Dose  . sodium chloride 0.9 % injection 10 mL  10 mL Intracatheter PRN Lequita Asal, MD      . sodium chloride 0.9 % injection 10 mL  10 mL Intravenous PRN Lequita Asal, MD   10 mL at 04/13/15 3646    Review of Systems General: fatigue and weakness Skin: negative HEENT: negative for, change in hearing, pain, discharge, tinnitus, vertigo, voice changes, sore throat, neck masses Pulmonary: negative Cardiac: negative for pain Gastrointestinal: distension but no n/v and ostomy is functioning well Genitourinary/Sexual: negative for, dysuria Ob/Gyn: negative for, irregular bleeding, pain Musculoskeletal: negative  Hematology: negative for, easy bruising, bleeding Neuro: peripheral neuropathy symptoms, chronic secondary from chemotherapy and DM  Objective:  Physical Examination:  BP (!) 101/59 (BP Location: Left Arm, Patient Position: Sitting)   Pulse 85   Temp 97.4 F (36.3 C) (Tympanic)   Ht 5' 3" (1.6 m)   Wt 132 lb 13.2 oz (60.2 kg)   BMI 23.53 kg/m   Body mass index is 23.53 kg/m.  ECOG Performance Status: 2 - Symptomatic, <50% confined to bed  General appearance: alert, cooperative and appears stated age,  pale HEENT:PERRLA, extra ocular movement intact and sclera clear, anicteric, mucous membranes moist Lymph node survey: non-palpable inguinal, supraclavicular Abdomen: soft, nondistended and nontendder, no mass/ascites/hernias. Ostomy bag intact, stoma partially seen and appears pink.  Incision well healed. Back: inspection of back is normal Extremities: extremities atraumatic, resolved edema Neurological exam reveals alert, oriented, normal speech, no focal findings or movement disorder noted.  Pelvic: EGBUS/Vagina: normal. Vaginal cuff intact. No gross lesions. Uterus/cervic surgically absent.  Bimanual: no masses or nodularity. RV confirms.    Lab Review Lab Results  Component Value Date   CA125 49.0 (H) 01/22/2016     Lab Results  Component Value Date   WBC 2.3 (L) 01/22/2016   HGB 11.8 (L) 01/22/2016   HCT 34.1 (L) 01/22/2016   MCV 85.6 01/22/2016   PLT 168 01/22/2016     Chemistry      Component Value Date/Time   NA 135 01/22/2016 1114   NA 134 (L) 09/27/2012 0459   K 4.6 01/22/2016 1114   K 4.5 09/27/2012 0459   CL 103 01/22/2016 1114   CL 100 09/27/2012 0459   CO2 26 01/22/2016 1114   CO2 28 09/27/2012 0459   BUN 19 01/22/2016 1114   BUN 8 09/27/2012 0459   CREATININE 0.43 (L) 01/22/2016 1114   CREATININE 0.60 09/27/2012 0459      Component Value Date/Time   CALCIUM 8.9 01/22/2016 1114   CALCIUM 8.5 09/27/2012 0459   ALKPHOS 60 01/22/2016 1114   ALKPHOS 100 09/21/2012 1210   AST 16 01/22/2016 1114   AST 36 09/21/2012 1210   ALT 20 01/22/2016 1114   ALT 23 09/21/2012 1210   BILITOT 0.4 01/22/2016 1114   BILITOT 0.7 09/21/2012 1210         Assessment:  [name] is a 70 y.o. 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    S/p diagnostic laparoscopy, exploratory laparotomy, lysis of adhesions (including enterolysis for > 45 minutes), total  abdominal hysterectomy with bilateral salpingo-oophorectomy,  infracolic omentectomy, optimal tumor debulking, recto-sigmoid resection with creation of end colostomy, mobilization of the splenic flexure  mobilization of the liver with diaphragmatic stripping and resection of diaphragm due to disease, repair of diaphragmatic defect and cholecystectomy on 06/15/2015.   Chemotherapy deferred due to multiple issues including postoperative infection, frailty, and RA-induced leukopenia. Now rising CA125, but asymptomatic.   Symptomatic right pleural effusion s/p thoracenteses complicated by right-sided empyema and perihepatic abscess.  Medical co-morbidities complicating care: HTN, insulin dependent diabetes with SQ infusion pump, CAD, rheumatoid arthritis, asymptomatic gall stones.   Plan:   Problem List Items Addressed This Visit      Genitourinary   Ovarian cancer [facility]) - Primary    Other Visit Diagnoses    Other specified counseling         She will follow up with Dr Mike Gip in October to follow up the CT scan to determine chemotherapy plan. I discussed with her that typically we like to complete 6 cycles of chemotherapy for front-line management. However, in her case she was unable to receive chemotherapy for various reasons. At this point her quality of life is the best it has been since her cancer diagnosis. She is asymptomatic from her cancer.  I have recommended only proceeding with chemotherapy if she has a symptomatic relapse or if her CT scan shows considerable disease. I will also reach out to Dr. Mike Gip to discuss further.   We can see her back in 3 months or as needed.   Gillis Ends, MD  CC:   Referring Provider: Lequita Asal, MD  Additional Care Provider: Dr. Harvest Dark. Dr. Percell Boston

## 2016-01-27 NOTE — Progress Notes (Signed)
Patient here for follow up she states no changes since last appointment.. 

## 2016-01-29 ENCOUNTER — Encounter: Payer: Medicare Other | Admitting: Surgery

## 2016-01-29 DIAGNOSIS — E10621 Type 1 diabetes mellitus with foot ulcer: Secondary | ICD-10-CM | POA: Diagnosis not present

## 2016-01-30 NOTE — Progress Notes (Signed)
INESS, FAZZONE ([id number]) Visit Report for 01/29/2016 Chief Complaint Document Details Patient Name: [name], [name] Date of Service: 01/29/2016 9:45 AM Medical Record Number: [id number] Patient Account Number: [id number] Date of Birth/Sex: 1945-09-17 (70 y.o. Female) Treating RN: Ahmed Prima Primary Care Physician: Caryl Bis, ERIC Other Clinician: Referring Physician: Caryl Bis, ERIC Treating Physician/Extender: Frann Rider in Treatment: 4 Information Obtained from: Patient Chief Complaint Patient is at the clinic for treatment of an open pressure ulcer to the left heel which she has for over 5 months Electronic Signature(s) Signed: 01/29/2016 10:20:19 AM By: Christin Fudge MD, FACS Entered By: Christin Fudge on 01/29/2016 10:20:18 [name] ([id number]) -------------------------------------------------------------------------------- HPI Details Patient Name: [name] Date of Service: 01/29/2016 9:45 AM Medical Record Number: [id number] Patient Account Number: [id number] Date of Birth/Sex: 1945/08/13 (70 y.o. Female) Treating RN: Ahmed Prima Primary Care Physician: Caryl Bis, ERIC Other Clinician: Referring Physician: Caryl Bis, ERIC Treating Physician/Extender: Frann Rider in Treatment: 4 History of Present Illness Location: ulcerated on the right heel Quality: Patient reports experiencing a dull pain to affected area(s). Severity: Patient states wound are getting worse. Duration: Patient has had the wound for <5 months prior to presenting for treatment Timing: Pain in wound is Intermittent (comes and goes Context: The wound appeared gradually over time Modifying Factors: Consults to this date include: local care was done as per the medication given by the PCP which may have been medihoney Associated Signs and Symptoms: Patient reports having difficulty standing for long periods. 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 In the past she was seen for a decubitus ulcer on her right gluteal area. She is known to have diabetes mellitus type 1 and her most recent A1c was 7.2% Past medical history significant for leukopenia, cholelithiasis, hypertension, chronic diastolic CHF, coronary artery disease,type 1 diabetes mellitus, rheumatoid arthritis, collagen vascular disease, ovarian cancer, status post CABG in 2014 and status post Port-A-Cath insertion by Dr. Leotis Pain in October 2016. She has quit smoking about 20 years ago. She was advised to use Medihoney with calcium alginate pads to be applied over the wound. 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 01/07/2016 -- x-ray of the left heel done -- IMPRESSION: No radiographic evidence of osteomyelitis. If this remains a clinical concern, recommend MR imaging. Electronic Signature(s) Signed: 01/29/2016 10:20:24 AM By: Christin Fudge MD, FACS Entered By: Christin Fudge on 01/29/2016 10:20:24 [name] ([id number]) -------------------------------------------------------------------------------- Physical Exam Details Patient Name: [name] Date of Service: 01/29/2016 9:45 AM Medical Record Number: [id number] Patient Account Number: [id number] Date of Birth/Sex: 04/17/1946 (70 y.o. Female) Treating RN: Ahmed Prima Primary Care Physician: Caryl Bis, ERIC Other Clinician: Referring Physician: Caryl Bis, ERIC Treating Physician/Extender: Frann Rider in  Treatment: 4 Constitutional . Pulse regular. Respirations normal and unlabored. Afebrile. . Eyes Nonicteric. Reactive to light. Ears, Nose, Mouth, and Throat Lips, teeth, and gums WNL.Marland Kitchen Moist mucosa without lesions. Neck supple and nontender. No palpable supraclavicular or cervical adenopathy. Normal sized without goiter. Respiratory WNL. No retractions.. Cardiovascular Pedal  Pulses WNL. No clubbing, cyanosis or edema. Chest Breasts symmetical and no nipple discharge.. Breast tissue WNL, no masses, lumps, or tenderness.. Lymphatic No adneopathy. No adenopathy. No adenopathy. Musculoskeletal Adexa without tenderness or enlargement.. Digits and nails w/o clubbing, cyanosis, infection, petechiae, ischemia, or inflammatory conditions.. Integumentary (Hair, Skin) No suspicious lesions. No crepitus or fluctuance. No peri-wound warmth or erythema. No masses.Marland Kitchen Psychiatric Judgement and insight Intact.. No evidence of depression, anxiety, or agitation.. Notes this forcep and moist gauze I was able to debride a lot of the subcutaneous tissue and it does not probe down to bone. Electronic Signature(s) Signed: 01/29/2016 10:21:25 AM By: Christin Fudge MD, FACS Previous Signature: 01/29/2016 10:21:00 AM Version By: Christin Fudge MD, FACS Entered By: Christin Fudge on 01/29/2016 10:21:24 [name] ([id number]) -------------------------------------------------------------------------------- Physician Orders Details Patient Name: [name] Date of Service: 01/29/2016 9:45 AM Medical Record Number: [id number] Patient Account Number: [id number] Date of Birth/Sex: Feb 23, 1946 (70 y.o. Female) Treating RN: Ahmed Prima Primary Care Physician: Caryl Bis, ERIC Other Clinician: Referring Physician: Caryl Bis, ERIC Treating Physician/Extender: Frann Rider in Treatment: 4 Verbal / Phone Orders: Yes ClinicianCarolyne Fiscal, Debi Read Back and Verified: Yes Diagnosis  Coding Wound Cleansing Wound #2 Left Calcaneus o Cleanse wound with mild soap and water o May Shower, gently pat wound dry prior to applying new dressing. Anesthetic Wound #2 Left Calcaneus o Topical Lidocaine 4% cream applied to wound bed prior to debridement Skin Barriers/Peri-Wound Care Wound #2 Left Calcaneus o Skin Prep Primary Wound Dressing Wound #2 Left Calcaneus o Santyl Ointment - on packing strip o Medihoney gel - Use if you cannot afford SAntyl Secondary Dressing Wound #2 Left Calcaneus o Dry Gauze o Boardered Foam Dressing Dressing Change Frequency Wound #2 Left Calcaneus o Change dressing every day. Follow-up Appointments Wound #2 Left Calcaneus o Return Appointment in 1 week. Off-Loading Wound #2 Left Calcaneus o Heel suspension boot to: - ELevate on pillows [name], DELCONTE. ([id number]) Additional Orders / Instructions Wound #2 Left Calcaneus o Increase protein intake. o Activity as tolerated o Other: - INclude Vitamin A, C, ZInc, MVI in diet Medications-please add to medication list. Wound #2 Left Calcaneus o Santyl Enzymatic Ointment Electronic Signature(s) Signed: 01/29/2016 3:47:29 PM By: Christin Fudge MD, FACS Signed: 01/29/2016 4:03:29 PM By: Alric Quan Entered By: Alric Quan on 01/29/2016 10:12:56 [name] ([id number]) -------------------------------------------------------------------------------- Problem List Details Patient Name: [name] Date of Service: 01/29/2016 9:45 AM Medical Record Number: [id number] Patient Account Number: [id number] Date of Birth/Sex: 1946-03-24 (70 y.o. Female) Treating RN: Ahmed Prima Primary Care Physician: Caryl Bis, ERIC Other Clinician: Referring Physician: Caryl Bis, ERIC Treating Physician/Extender: Frann Rider in Treatment: 4 Active Problems ICD-10 Encounter Code Description Active Date Diagnosis E10.621 Type 1 diabetes mellitus  with foot ulcer 12/31/2015 Yes L89.623 Pressure ulcer of left heel, stage 3 12/31/2015 Yes Z92.21 Personal history of antineoplastic chemotherapy 12/31/2015 Yes Z79.52 Long term (current) use of systemic steroids 12/31/2015 Yes Inactive Problems Resolved Problems Electronic Signature(s) Signed: 01/29/2016 10:20:11 AM By: Christin Fudge MD, FACS Entered By: Christin Fudge on 01/29/2016 10:20:10 [name] ([id number]) -------------------------------------------------------------------------------- Progress Note Details Patient Name: [name] Date of Service: 01/29/2016 9:45 AM Medical Record Number: [id number] Patient Account Number: [id number] Date of Birth/Sex: August 23, 1945 (70 y.o. Female) Treating RN: Ahmed Prima Primary Care Physician: Caryl Bis, ERIC Other Clinician: Referring Physician: Caryl Bis, ERIC Treating Physician/Extender: Frann Rider in Treatment: 4 Subjective Chief Complaint Information obtained from Patient Patient is at the clinic for treatment of an  open pressure ulcer to the left heel which she has for over 5 months History of Present Illness (HPI) The following HPI elements were documented for the patient's wound: Location: ulcerated on the right heel Quality: Patient reports experiencing a dull pain to affected area(s). Severity: Patient states wound are getting worse. Duration: Patient has had the wound for Timing: Pain in wound is Intermittent (comes and goes Context: The wound appeared gradually over time Modifying Factors: Consults to this date include: local care was done as per the medication given by the PCP which may have been medihoney Associated Signs and Symptoms: Patient reports having difficulty standing for long periods. 70 year old female with bilateral ovarian masses with associated peritoneal metastasis disease is on chemotherapy seen by as in February of this year and now returns with a left heel ulcer which she's had  for about 5 months. In the past she was seen for a decubitus ulcer on her right gluteal area. She is known to have diabetes mellitus type 1 and her most recent A1c was 7.2% Past medical history significant for leukopenia, cholelithiasis, hypertension, chronic diastolic CHF, coronary artery disease,type 1 diabetes mellitus, rheumatoid arthritis, collagen vascular disease, ovarian cancer, status post CABG in 2014 and status post Port-A-Cath insertion by Dr. Leotis Pain in October 2016. She has quit smoking about 20 years ago. She was advised to use Medihoney with calcium alginate pads to be applied over the wound. 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 01/07/2016 -- x-ray of the left heel done -- IMPRESSION: No radiographic evidence of osteomyelitis. If this remains a clinical concern, recommend MR imaging. [name], [name] ([id number]) Objective Constitutional Pulse regular. Respirations normal and unlabored. Afebrile. Vitals Time Taken: 9:58 AM, Height: 63 in, Weight: 130 lbs, BMI: 23, Temperature: 97.6 F, Pulse: 73 bpm, Respiratory Rate: 18 breaths/min, Blood Pressure: 108/38 mmHg. General Notes: Made Dr. Con Memos aware of pts diastolic BP. Eyes Nonicteric. Reactive to light. Ears, Nose, Mouth, and Throat Lips, teeth, and gums WNL.Marland Kitchen Moist mucosa without lesions. Neck supple and nontender. No palpable supraclavicular or cervical adenopathy. Normal sized without goiter. Respiratory WNL. No retractions.. Cardiovascular Pedal Pulses WNL. No clubbing, cyanosis or edema. Chest Breasts symmetical and no nipple discharge.. Breast tissue WNL, no masses, lumps, or tenderness.. Lymphatic No  adneopathy. No adenopathy. No adenopathy. Musculoskeletal Adexa without tenderness or enlargement.. Digits and nails w/o clubbing, cyanosis, infection, petechiae, ischemia, or inflammatory conditions.Marland Kitchen Psychiatric Judgement and insight Intact.. No evidence of depression, anxiety, or agitation.. General Notes: this forcep and moist gauze I was able to debride a lot of the subcutaneous tissue and it does not probe down to bone. Integumentary (Hair, Skin) No suspicious lesions. No crepitus or fluctuance. No peri-wound warmth or erythema. No masses.. Wound #2 status is Open. Original cause of wound was Pressure Injury. The wound is located on the Left Calcaneus. The wound measures 0.3cm length x 0.3cm width x 0.5cm depth; 0.071cm^2 area and [name], [name]. ([id number]) 0.035cm^3 volume. The wound is limited to skin breakdown. There is no tunneling or undermining noted. There is a large amount of serous drainage noted. The wound margin is flat and intact. There is no granulation within the wound bed. There is a  large (67-100%) amount of necrotic tissue within the wound bed including Adherent Slough. The periwound skin appearance exhibited: Maceration. The periwound skin appearance did not exhibit: Callus, Crepitus, Excoriation, Fluctuance, Friable, Induration, Localized Edema, Rash, Scarring, Dry/Scaly, Moist, Atrophie Blanche, Cyanosis, Ecchymosis, Hemosiderin Staining, Mottled, Pallor, Rubor, Erythema. Periwound temperature was noted as No Abnormality. The periwound has tenderness on palpation. Assessment Active Problems ICD-10 E10.621 - Type 1 diabetes mellitus with foot ulcer L89.623 - Pressure ulcer of left heel, stage 3 Z92.21 - Personal history of antineoplastic chemotherapy Z79.52 - Long term (current) use of systemic steroids I have recommended: 1. Off-Loading as much as possible and using proper footwear. 2. Santyl ointment to the wound packed with plain packing gauze, and  dressings to be changed daily. 3. may be ready soon for packing with some Prisma Ag, after stopping Santyl ointment. 4. Continue good control of her diabetes mellitus 5. Adequate protein, vitamin C, zinc Plan Wound Cleansing: Wound #2 Left Calcaneus: Cleanse wound with mild soap and water May Shower, gently pat wound dry prior to applying new dressing. Anesthetic: Wound #2 Left Calcaneus: Topical Lidocaine 4% cream applied to wound bed prior to debridement Skin Barriers/Peri-Wound Care: Wound #2 Left Calcaneus: Skin Prep Primary Wound Dressing: Wound #2 Left Calcaneus: Santyl Ointment - on packing strip [name], MATEEN. ([id number]) Medihoney gel - Use if you cannot afford SAntyl Secondary Dressing: Wound #2 Left Calcaneus: Dry Gauze Boardered Foam Dressing Dressing Change Frequency: Wound #2 Left Calcaneus: Change dressing every day. Follow-up Appointments: Wound #2 Left Calcaneus: Return Appointment in 1 week. Off-Loading: Wound #2 Left Calcaneus: Heel suspension boot to: - ELevate on pillows Additional Orders / Instructions: Wound #2 Left Calcaneus: Increase protein intake. Activity as tolerated Other: - INclude Vitamin A, C, ZInc, MVI in diet Medications-please add to medication list.: Wound #2 Left Calcaneus: Santyl Enzymatic Ointment I have recommended: 1. Off-Loading as much as possible and using proper footwear. 2. Santyl ointment to the wound packed with plain packing gauze, and dressings to be changed daily. 3. may be ready soon for packing with some Prisma Ag, after stopping Santyl ointment. 4. Continue good control of her diabetes mellitus 5. Adequate protein, vitamin C, zinc Electronic Signature(s) Signed: 01/29/2016 3:49:57 PM By: Christin Fudge MD, FACS Previous Signature: 01/29/2016 10:22:57 AM Version By: Christin Fudge MD, FACS Entered By: Christin Fudge on 01/29/2016 15:49:57 [name]  ([id number]) -------------------------------------------------------------------------------- SuperBill Details Patient Name: [name] Date of Service: 01/29/2016 Medical Record Number: [id number] Patient Account Number: [id number] Date of Birth/Sex: May 10, 1945 (70 y.o. Female) Treating RN: Ahmed Prima Primary Care Physician: Caryl Bis, ERIC Other Clinician: Referring Physician: Caryl Bis, ERIC Treating Physician/Extender: Frann Rider in Treatment: 4 Diagnosis Coding ICD-10 Codes Code Description E10.621 Type 1 diabetes mellitus with foot ulcer L89.623 Pressure ulcer of left heel, stage 3 Z92.21 Personal history of antineoplastic chemotherapy Z79.52 Long term (current) use of systemic steroids Facility Procedures CPT4 Code: [id number] Description: 99213 - WOUND CARE VISIT-LEV 3 EST PT Modifier: Quantity: 1 Physician Procedures CPT4 Code: [id number] Description: [id number] - WC PHYS LEVEL 3 - EST PT ICD-10 Description Diagnosis E10.621 Type 1 diabetes mellitus with foot ulcer L89.623 Pressure ulcer of left heel, stage 3 Z92.21 Personal history of antineoplastic chemotherap Modifier: y Quantity: 1 Electronic Signature(s) Signed: 01/29/2016 3:47:29 PM By: Christin Fudge MD, FACS Signed: 01/29/2016 4:03:29 PM By: Alric Quan Previous Signature: 01/29/2016 10:23:40 AM Version By: Christin Fudge MD, FACS Entered By: Alric Quan on 01/29/2016 10:25:34

## 2016-01-30 NOTE — Progress Notes (Signed)
HEERA, WENIG ([id number]) Visit Report for 01/29/2016 Arrival Information Details Patient Name: [name], [name] Date of Service: 01/29/2016 9:45 AM Medical Record Number: [id number] Patient Account Number: [id number] Date of Birth/Sex: 1946/03/02 (70 y.o. Female) Treating RN: Ahmed Prima Primary Care Physician: Caryl Bis, ERIC Other Clinician: Referring Physician: Caryl Bis, ERIC Treating Physician/Extender: Frann Rider in Treatment: 4 Visit Information History Since Last Visit All ordered tests and consults were completed: No Patient Arrived: Cane Added or deleted any medications: No Arrival Time: 09:58 Any new allergies or adverse reactions: No Accompanied By: husband Had a fall or experienced change in No Transfer Assistance: None activities of daily living that may affect Patient Identification Verified: Yes risk of falls: Secondary Verification Process Yes Signs or symptoms of abuse/neglect since last No Completed: visito Patient Requires Transmission- No Hospitalized since last visit: No Based Precautions: Pain Present Now: No Patient Has Alerts: Yes Patient Alerts: Patient on Blood Thinner lovenox BID DMII Electronic Signature(s) Signed: 01/29/2016 4:03:29 PM By: Alric Quan Entered By: Alric Quan on 01/29/2016 09:58:43 [name] ([id number]) -------------------------------------------------------------------------------- Clinic Level of Care Assessment Details Patient Name: [name] Date of Service: 01/29/2016 9:45 AM Medical Record Number: [id number] Patient Account Number: [id number] Date of Birth/Sex: 1945-08-21 (70 y.o. Female) Treating RN: Carolyne Fiscal, Debi Primary Care Physician: Caryl Bis, ERIC Other Clinician: Referring Physician: Caryl Bis, ERIC Treating Physician/Extender: Frann Rider in Treatment: 4 Clinic Level of Care Assessment Items TOOL 4 Quantity Score X - Use when only an EandM is  performed on FOLLOW-UP visit 1 0 ASSESSMENTS - Nursing Assessment / Reassessment X - Reassessment of Co-morbidities (includes updates in patient status) 1 10 X - Reassessment of Adherence to Treatment Plan 1 5 ASSESSMENTS - Wound and Skin Assessment / Reassessment X - Simple Wound Assessment / Reassessment - one wound 1 5 []  - Complex Wound Assessment / Reassessment - multiple wounds 0 []  - Dermatologic / Skin Assessment (not related to wound area) 0 ASSESSMENTS - Focused Assessment []  - Circumferential Edema Measurements - multi extremities 0 []  - Nutritional Assessment / Counseling / Intervention 0 []  - Lower Extremity Assessment (monofilament, tuning fork, pulses) 0 []  - Peripheral Arterial Disease Assessment (using hand held doppler) 0 ASSESSMENTS - Ostomy and/or Continence Assessment and Care []  - Incontinence Assessment and Management 0 []  - Ostomy Care Assessment and Management (repouching, etc.) 0 PROCESS - Coordination of Care X - Simple Patient / Family Education for ongoing care 1 15 []  - Complex (extensive) Patient / Family Education for ongoing care 0 X - Staff obtains Programmer, systems, Records, Test Results / Process Orders 1 10 []  - Staff telephones HHA, Nursing Homes / Clarify orders / etc 0 []  - Routine Transfer to another Facility (non-emergent condition) 0 ADITHRI, CASHELL. ([id number]) []  - Routine Hospital Admission (non-emergent condition) 0 []  - New Admissions / Biomedical engineer / Ordering NPWT, Apligraf, etc. 0 []  - Emergency Hospital Admission (emergent condition) 0 X - Simple Discharge Coordination 1 10 []  - Complex (extensive) Discharge Coordination 0 PROCESS - Special Needs []  - Pediatric / Minor Patient Management 0 []  - Isolation Patient Management 0 []  - Hearing / Language / Visual special needs 0 []  - Assessment of Community assistance (transportation, D/C planning, etc.) 0 []  - Additional assistance / Altered mentation 0 []  - Support Surface(s)  Assessment (bed, cushion, seat, etc.) 0 INTERVENTIONS - Wound Cleansing / Measurement X - Simple Wound Cleansing - one wound 1 5 []  - Complex Wound Cleansing - multiple wounds 0 X -  Wound Imaging (photographs - any number of wounds) 1 5 []  - Wound Tracing (instead of photographs) 0 X - Simple Wound Measurement - one wound 1 5 []  - Complex Wound Measurement - multiple wounds 0 INTERVENTIONS - Wound Dressings X - Small Wound Dressing one or multiple wounds 1 10 []  - Medium Wound Dressing one or multiple wounds 0 []  - Large Wound Dressing one or multiple wounds 0 X - Application of Medications - topical 1 5 []  - Application of Medications - injection 0 INTERVENTIONS - Miscellaneous []  - External ear exam 0 [name], [name]. ([id number]) []  - Specimen Collection (cultures, biopsies, blood, body fluids, etc.) 0 []  - Specimen(s) / Culture(s) sent or taken to Lab for analysis 0 []  - Patient Transfer (multiple staff / Harrel Lemon Lift / Similar devices) 0 []  - Simple Staple / Suture removal (25 or less) 0 []  - Complex Staple / Suture removal (26 or more) 0 []  - Hypo / Hyperglycemic Management (close monitor of Blood Glucose) 0 []  - Ankle / Brachial Index (ABI) - do not check if billed separately 0 X - Vital Signs 1 5 Has the patient been seen at the hospital within the last three years: Yes Total Score: 90 Level Of Care: New/Established - Level 3 Electronic Signature(s) Signed: 01/29/2016 4:03:29 PM By: Alric Quan Entered By: Alric Quan on 01/29/2016 10:25:22 [name] ([id number]) -------------------------------------------------------------------------------- Encounter Discharge Information Details Patient Name: [name] Date of Service: 01/29/2016 9:45 AM Medical Record Number: [id number] Patient Account Number: [id number] Date of Birth/Sex: Jun 03, 1945 (70 y.o. Female) Treating RN: Ahmed Prima Primary Care Physician: Caryl Bis, ERIC Other Clinician: Referring  Physician: Caryl Bis, ERIC Treating Physician/Extender: Frann Rider in Treatment: 4 Encounter Discharge Information Items Discharge Pain Level: 0 Discharge Condition: Stable Ambulatory Status: Cane Discharge Destination: Home Transportation: Private Auto Accompanied By: husband Schedule Follow-up Appointment: Yes Medication Reconciliation completed Yes and provided to Patient/Care Ishaan Villamar: Provided on Clinical Summary of Care: 01/29/2016 Form Type Recipient Paper Patient SR Electronic Signature(s) Signed: 01/29/2016 10:23:02 AM By: Ruthine Dose Entered By: Ruthine Dose on 01/29/2016 10:23:02 [name] ([id number]) -------------------------------------------------------------------------------- Lower Extremity Assessment Details Patient Name: [name] Date of Service: 01/29/2016 9:45 AM Medical Record Number: [id number] Patient Account Number: [id number] Date of Birth/Sex: 11-Nov-1945 (70 y.o. Female) Treating RN: Ahmed Prima Primary Care Physician: Caryl Bis, ERIC Other Clinician: Referring Physician: Caryl Bis, ERIC Treating Physician/Extender: Frann Rider in Treatment: 4 Vascular Assessment Pulses: Posterior Tibial Dorsalis Pedis Palpable: [Left:Yes] Extremity colors, hair growth, and conditions: Extremity Color: [Left:Normal] Temperature of Extremity: [Left:Cool] Capillary Refill: [Left:< 3 seconds] Toe Nail Assessment Left: Right: Thick: No Discolored: No Deformed: No Improper Length and Hygiene: No Electronic Signature(s) Signed: 01/29/2016 4:03:29 PM By: Alric Quan Entered By: Alric Quan on 01/29/2016 10:02:27 [name] ([id number]) -------------------------------------------------------------------------------- Multi Wound Chart Details Patient Name: [name] Date of Service: 01/29/2016 9:45 AM Medical Record Number: [id number] Patient Account Number: [id number] Date of Birth/Sex: [birth year]  (70 y.o. Female) Treating RN: Ahmed Prima Primary Care Physician: Caryl Bis, ERIC Other Clinician: Referring Physician: Caryl Bis, ERIC Treating Physician/Extender: Frann Rider in Treatment: 4 Vital Signs Height(in): 63 Pulse(bpm): 73 Weight(lbs): 130 Blood Pressure 108/38 (mmHg): Body Mass Index(BMI): 23 Temperature(F): 97.6 Respiratory Rate 18 (breaths/min): Photos: [2:No Photos] [N/A:N/A] Wound Location: [2:Left Calcaneus] [N/A:N/A] Wounding Event: [2:Pressure Injury] [N/A:N/A] Primary Etiology: [2:Pressure Ulcer] [N/A:N/A] Secondary Etiology: [2:Diabetic Wound/Ulcer of the Lower Extremity] [N/A:N/A] Comorbid History: [2:Cataracts, Type I Diabetes, Rheumatoid Arthritis, Osteoarthritis, Neuropathy, Received Chemotherapy] [N/A:N/A] Date Acquired: [2:07/15/2015] [  N/A:N/A] Weeks of Treatment: [2:4] [N/A:N/A] Wound Status: [2:Open] [N/A:N/A] Measurements L x W x D 0.3x0.3x0.5 [N/A:N/A] (cm) Area (cm) : [2:0.071] [N/A:N/A] Volume (cm) : [2:0.035] [N/A:N/A] % Reduction in Area: [2:69.90%] [N/A:N/A] % Reduction in Volume: 62.80% [N/A:N/A] Classification: [2:Category/Stage III] [N/A:N/A] HBO Classification: [2:Grade 1] [N/A:N/A] Exudate Amount: [2:Large] [N/A:N/A] Exudate Type: [2:Serous] [N/A:N/A] Exudate Color: [2:amber] [N/A:N/A] Wound Margin: [2:Flat and Intact] [N/A:N/A] Granulation Amount: [2:None Present (0%)] [N/A:N/A] Necrotic Amount: [2:Large (67-100%)] [N/A:N/A] Exposed Structures: [2:Fascia: No Fat: No] [N/A:N/A] Tendon: No Muscle: No Joint: No Bone: No Limited to Skin Breakdown Epithelialization: None N/A N/A Periwound Skin Texture: Edema: No N/A N/A Excoriation: No Induration: No Callus: No Crepitus: No Fluctuance: No Friable: No Rash: No Scarring: No Periwound Skin Maceration: Yes N/A N/A Moisture: Moist: No Dry/Scaly: No Periwound Skin Color: Atrophie Blanche: No N/A N/A Cyanosis: No Ecchymosis: No Erythema:  No Hemosiderin Staining: No Mottled: No Pallor: No Rubor: No Temperature: No Abnormality N/A N/A Tenderness on Yes N/A N/A Palpation: Wound Preparation: Ulcer Cleansing: N/A N/A Rinsed/Irrigated with Saline Topical Anesthetic Applied: Other: lidocaine 4% Treatment Notes Electronic Signature(s) Signed: 01/29/2016 4:03:29 PM By: Alric Quan Entered By: Alric Quan on 01/29/2016 10:10:28 [name] ([id number]) -------------------------------------------------------------------------------- [place] Details Patient Name: [name] Date of Service: 01/29/2016 9:45 AM Medical Record Number: [id number] Patient Account Number: [id number] Date of Birth/Sex: 09/30/1945 (70 y.o. Female) Treating RN: Carolyne Fiscal, Debi Primary Care Physician: Caryl Bis, ERIC Other Clinician: Referring Physician: Caryl Bis, ERIC Treating Physician/Extender: Frann Rider in Treatment: 4 Active Inactive Orientation to the Wound Care Program Nursing Diagnoses: Knowledge deficit related to the wound healing center program Goals: Patient/caregiver will verbalize understanding of the [facility] Date Initiated: 12/31/2015 Goal Status: Active Interventions: Provide education on orientation to the wound center Notes: Pressure Nursing Diagnoses: Knowledge deficit related to causes and risk factors for pressure ulcer development Knowledge deficit related to management of pressures ulcers Potential for impaired tissue integrity related to pressure, friction, moisture, and shear Goals: Patient will remain free from development of additional pressure ulcers Date Initiated: 12/31/2015 Goal Status: Active Patient will remain free of pressure ulcers Date Initiated: 12/31/2015 Goal Status: Active Patient/caregiver will verbalize risk factors for pressure ulcer development Date Initiated: 12/31/2015 Goal Status: Active Patient/caregiver will  verbalize understanding of pressure ulcer management Date Initiated: 12/31/2015 Goal Status: Active Interventions: [name], [name] ([id number]) Assess: immobility, friction, shearing, incontinence upon admission and as needed Assess offloading mechanisms upon admission and as needed Assess potential for pressure ulcer upon admission and as needed Provide education on pressure ulcers Treatment Activities: Patient referred for pressure reduction/relief devices : 12/31/2015 Notes: Wound/Skin Impairment Nursing Diagnoses: Impaired tissue integrity Knowledge deficit related to smoking impact on wound healing Knowledge deficit related to ulceration/compromised skin integrity Goals: Patient/caregiver will verbalize understanding of skin care regimen Date Initiated: 12/31/2015 Goal Status: Active Ulcer/skin breakdown will have a volume reduction of 30% by week 4 Date Initiated: 12/31/2015 Goal Status: Active Ulcer/skin breakdown will have a volume reduction of 50% by week 8 Date Initiated: 12/31/2015 Goal Status: Active Ulcer/skin breakdown will have a volume reduction of 80% by week 12 Date Initiated: 12/31/2015 Goal Status: Active Ulcer/skin breakdown will heal within 14 weeks Date Initiated: 12/31/2015 Goal Status: Active Interventions: Assess patient/caregiver ability to obtain necessary supplies Assess patient/caregiver ability to perform ulcer/skin care regimen upon admission and as needed Assess ulceration(s) every visit Provide education on ulcer and skin care Treatment Activities: Skin care regimen initiated : 12/31/2015 Topical wound management  initiated : 12/31/2015 Notes: [name], [name] ([id number]) Electronic Signature(s) Signed: 01/29/2016 4:03:29 PM By: Alric Quan Entered By: Alric Quan on 01/29/2016 10:10:11 [name], [name] ([id number]) -------------------------------------------------------------------------------- Pain Assessment Details Patient Name:  [name] Date of Service: 01/29/2016 9:45 AM Medical Record Number: [id number] Patient Account Number: [id number] Date of Birth/Sex: [birth year] (70 y.o. Female) Treating RN: Ahmed Prima Primary Care Physician: Caryl Bis, ERIC Other Clinician: Referring Physician: Caryl Bis, ERIC Treating Physician/Extender: Frann Rider in Treatment: 4 Active Problems Location of Pain Severity and Description of Pain Patient Has Paino No Site Locations With Dressing Change: No Pain Management and Medication Current Pain Management: Electronic Signature(s) Signed: 01/29/2016 4:03:29 PM By: Alric Quan Entered By: Alric Quan on 01/29/2016 09:58:50 [name] ([id number]) -------------------------------------------------------------------------------- Patient/Caregiver Education Details Patient Name: [name] Date of Service: 01/29/2016 9:45 AM Medical Record Number: [id number] Patient Account Number: [id number] Date of Birth/Gender: 07-14-1945 (70 y.o. Female) Treating RN: Ahmed Prima Primary Care Physician: Caryl Bis, ERIC Other Clinician: Referring Physician: Caryl Bis, ERIC Treating Physician/Extender: Frann Rider in Treatment: 4 Education Assessment Education Provided To: Patient Education Topics Provided Wound/Skin Impairment: Handouts: Other: change dressing as ordered Methods: Demonstration, Explain/Verbal Responses: State content correctly Electronic Signature(s) Signed: 01/29/2016 4:03:29 PM By: Alric Quan Entered By: Alric Quan on 01/29/2016 10:11:29 [name] ([id number]) -------------------------------------------------------------------------------- Wound Assessment Details Patient Name: [name] Date of Service: 01/29/2016 9:45 AM Medical Record Number: [id number] Patient Account Number: [id number] Date of Birth/Sex: [birth year] (70 y.o. Female) Treating RN: Carolyne Fiscal, Debi Primary Care  Physician: Caryl Bis, ERIC Other Clinician: Referring Physician: Caryl Bis, ERIC Treating Physician/Extender: Frann Rider in Treatment: 4 Wound Status Wound Number: 2 Primary Pressure Ulcer Etiology: Wound Location: Left Calcaneus Secondary Diabetic Wound/Ulcer of the Lower Wounding Event: Pressure Injury Etiology: Extremity Date Acquired: 07/15/2015 Wound Open Weeks Of Treatment: 4 Status: Clustered Wound: No Comorbid Cataracts, Type I Diabetes, History: Rheumatoid Arthritis, Osteoarthritis, Neuropathy, Received Chemotherapy Photos Photo Uploaded By: Alric Quan on 01/29/2016 10:27:58 Wound Measurements Length: (cm) 0.3 Width: (cm) 0.3 Depth: (cm) 0.5 Area: (cm) 0.071 Volume: (cm) 0.035 % Reduction in Area: 69.9% % Reduction in Volume: 62.8% Epithelialization: None Tunneling: No Undermining: No Wound Description Classification: Category/Stage III Foul Odor Diabetic Severity (Wagner): Grade 1 Wound Margin: Flat and Intact Exudate Amount: Large Exudate Type: Serous Exudate Color: amber After Cleansing: No Wound Bed Granulation Amount: None Present (0%) Exposed Structure [name], [name] ([id number]) Necrotic Amount: Large (67-100%) Fascia Exposed: No Necrotic Quality: Adherent Slough Fat Layer Exposed: No Tendon Exposed: No Muscle Exposed: No Joint Exposed: No Bone Exposed: No Limited to Skin Breakdown Periwound Skin Texture Texture Color No Abnormalities Noted: No No Abnormalities Noted: No Callus: No Atrophie Blanche: No Crepitus: No Cyanosis: No Excoriation: No Ecchymosis: No Fluctuance: No Erythema: No Friable: No Hemosiderin Staining: No Induration: No Mottled: No Localized Edema: No Pallor: No Rash: No Rubor: No Scarring: No Temperature / Pain Moisture Temperature: No Abnormality No Abnormalities Noted: No Tenderness on Palpation: Yes Dry / Scaly: No Maceration: Yes Moist: No Wound Preparation Ulcer Cleansing:  Rinsed/Irrigated with Saline Topical Anesthetic Applied: Other: lidocaine 4%, Treatment Notes Wound #2 (Left Calcaneus) 1. Cleansed with: Clean wound with Normal Saline 2. Anesthetic Topical Lidocaine 4% cream to wound bed prior to debridement 3. Peri-wound Care: Skin Prep 4. Dressing Applied: Santyl Ointment Plain packing gauze 5. Secondary Dressing Applied Bordered Foam Dressing Dry Gauze Electronic Signature(s) Signed: 01/29/2016 4:03:29 PM By: Judy Pimple ([id number]) Entered By: Alric Quan on 01/29/2016 10:05:36  [name], [name] ([id number]) -------------------------------------------------------------------------------- Vitals Details Patient Name: [name], [name] Date of Service: 01/29/2016 9:45 AM Medical Record Number: [id number] Patient Account Number: [id number] Date of Birth/Sex: 08-06-1945 (70 y.o. Female) Treating RN: Carolyne Fiscal, Debi Primary Care Physician: Caryl Bis, ERIC Other Clinician: Referring Physician: Caryl Bis, ERIC Treating Physician/Extender: Frann Rider in Treatment: 4 Vital Signs Time Taken: 09:58 Temperature (F): 97.6 Height (in): 63 Pulse (bpm): 73 Weight (lbs): 130 Respiratory Rate (breaths/min): 18 Body Mass Index (BMI): 23 Blood Pressure (mmHg): 108/38 Reference Range: 80 - 120 mg / dl Notes Made Dr. Con Memos aware of pts diastolic BP. Electronic Signature(s) Signed: 01/29/2016 4:03:29 PM By: Alric Quan Entered By: Alric Quan on 01/29/2016 11:30:25

## 2016-02-01 ENCOUNTER — Other Ambulatory Visit: Payer: Self-pay | Admitting: Family Medicine

## 2016-02-01 ENCOUNTER — Ambulatory Visit
Admission: RE | Admit: 2016-02-01 | Discharge: 2016-02-01 | Disposition: A | Discharge status: Home / Self Care | Payer: Medicare Other | Source: Ambulatory Visit | Attending: Family Medicine | Admitting: Family Medicine

## 2016-02-01 DIAGNOSIS — R928 Other abnormal and inconclusive findings on diagnostic imaging of breast: Secondary | ICD-10-CM | POA: Insufficient documentation

## 2016-02-01 DIAGNOSIS — Z1239 Encounter for other screening for malignant neoplasm of breast: Secondary | ICD-10-CM

## 2016-02-01 DIAGNOSIS — Z1231 Encounter for screening mammogram for malignant neoplasm of breast: Secondary | ICD-10-CM | POA: Insufficient documentation

## 2016-02-02 ENCOUNTER — Other Ambulatory Visit: Payer: Self-pay | Admitting: Family Medicine

## 2016-02-02 DIAGNOSIS — N6489 Other specified disorders of breast: Secondary | ICD-10-CM

## 2016-02-03 ENCOUNTER — Other Ambulatory Visit: Payer: Self-pay

## 2016-02-03 MED ORDER — LOSARTAN POTASSIUM 100 MG PO TABS: 100.0000 mg | ORAL_TABLET | Freq: Every day | ORAL | 1 refills | Status: DC

## 2016-02-05 ENCOUNTER — Encounter: Payer: Medicare Other | Attending: Surgery | Admitting: Nurse Practitioner

## 2016-02-05 DIAGNOSIS — Z7952 Long term (current) use of systemic steroids: Secondary | ICD-10-CM | POA: Insufficient documentation

## 2016-02-05 DIAGNOSIS — E10621 Type 1 diabetes mellitus with foot ulcer: Secondary | ICD-10-CM | POA: Diagnosis present

## 2016-02-05 DIAGNOSIS — Z87891 Personal history of nicotine dependence: Secondary | ICD-10-CM | POA: Insufficient documentation

## 2016-02-05 DIAGNOSIS — I5032 Chronic diastolic (congestive) heart failure: Secondary | ICD-10-CM | POA: Insufficient documentation

## 2016-02-05 DIAGNOSIS — Z794 Long term (current) use of insulin: Secondary | ICD-10-CM | POA: Insufficient documentation

## 2016-02-05 DIAGNOSIS — Z9221 Personal history of antineoplastic chemotherapy: Secondary | ICD-10-CM | POA: Diagnosis not present

## 2016-02-05 DIAGNOSIS — Z79899 Other long term (current) drug therapy: Secondary | ICD-10-CM | POA: Insufficient documentation

## 2016-02-05 DIAGNOSIS — M069 Rheumatoid arthritis, unspecified: Secondary | ICD-10-CM | POA: Insufficient documentation

## 2016-02-05 DIAGNOSIS — I2510 Atherosclerotic heart disease of native coronary artery without angina pectoris: Secondary | ICD-10-CM | POA: Diagnosis not present

## 2016-02-05 DIAGNOSIS — E1040 Type 1 diabetes mellitus with diabetic neuropathy, unspecified: Secondary | ICD-10-CM | POA: Insufficient documentation

## 2016-02-05 DIAGNOSIS — C787 Secondary malignant neoplasm of liver and intrahepatic bile duct: Secondary | ICD-10-CM | POA: Insufficient documentation

## 2016-02-05 DIAGNOSIS — I110 Hypertensive heart disease with heart failure: Secondary | ICD-10-CM | POA: Insufficient documentation

## 2016-02-05 DIAGNOSIS — L89623 Pressure ulcer of left heel, stage 3: Secondary | ICD-10-CM | POA: Diagnosis not present

## 2016-02-05 DIAGNOSIS — C569 Malignant neoplasm of unspecified ovary: Secondary | ICD-10-CM | POA: Insufficient documentation

## 2016-02-06 NOTE — Progress Notes (Addendum)
[name] ([id number]) Visit Report for 02/05/2016 Chief Complaint Document Details Patient Name: [name], [name] 02/05/2016 10:45 Date of Service: AM Medical Record [id number] Number: Patient Account Number: [id number] 1946-03-02 (70 y.o. Treating RN: Baruch Gouty, RN, BSN, Velva Harman Date of Birth/Sex: Female) Other Clinician: Primary Care Physician: Caryl Bis, ERIC Treating Londell Moh Referring Physician: Caryl Bis, ERIC Physician/Extender: Suella Grove in Treatment: 5 Information Obtained from: Patient Chief Complaint Patient is at the clinic for treatment of an open pressure ulcer to the left heel which she has for over 5 months Electronic Signature(s) Signed: 02/05/2016 4:21:25 PM By: Londell Moh FNP Entered By: Londell Moh on 02/05/2016 11:59:38 [name] ([id number]) -------------------------------------------------------------------------------- Debridement Details Patient Name: [name], [name]. 02/05/2016 10:45 Date of Service: AM Medical Record [id number] Number: Patient Account Number: [id number] 12-28-1945 (70 y.o. Treating RN: Baruch Gouty, RN, BSN, Velva Harman Date of Birth/Sex: Female) Other Clinician: Primary Care Physician: Caryl Bis, ERIC Treating Londell Moh Referring Physician: Caryl Bis, ERIC Physician/Extender: Suella Grove in Treatment: 5 Debridement Performed for Wound #2 Left Calcaneus Assessment: Performed By: Physician Londell Moh, NP Debridement: Debridement Pre-procedure Yes - 10:55 Verification/Time Out Taken: Start Time: 10:55 Pain Control: Lidocaine 4% Topical Solution Level: Skin/Subcutaneous Tissue Total Area Debrided (L x 0.5 (cm) x 0.5 (cm) = 0.25 (cm) W): Tissue and other Non-Viable, Fibrin/Slough, Subcutaneous material debrided: Instrument: Curette Bleeding: Minimum Hemostasis Achieved: Pressure End Time: 10:58 Procedural Pain: 0 Post Procedural Pain: 0 Response to Treatment: Procedure was tolerated well Post  Debridement Measurements of Total Wound Length: (cm) 0.5 Stage: Category/Stage III Width: (cm) 0.5 Depth: (cm) 1 Volume: (cm) 0.196 Character of Wound/Ulcer Post Stable Debridement: Severity of Tissue Post Fat layer exposed Debridement: Post Procedure Diagnosis Same as Pre-procedure Electronic Signature(s) Signed: 02/05/2016 3:48:46 PM By: Regan Lemming BSN, RN Mcomber, [name] ([id number]) Signed: 02/05/2016 4:21:25 PM By: Londell Moh FNP Entered By: Regan Lemming on 02/05/2016 11:02:14 [name] ([id number]) -------------------------------------------------------------------------------- HPI Details Patient Name: [name] 02/05/2016 10:45 Date of Service: AM Medical Record [id number] Number: Patient Account Number: [id number] 1945-06-21 (70 y.o. Treating RN: Baruch Gouty, RN, BSN, Velva Harman Date of Birth/Sex: Female) Other Clinician: Primary Care Physician: Caryl Bis, ERIC Treating Londell Moh Referring Physician: Caryl Bis, ERIC Physician/Extender: Suella Grove in Treatment: 5 History of Present Illness Location: ulcerated on the right heel Quality: Patient reports experiencing a dull pain to affected area(s). Severity: Patient states wound are getting worse. Duration: Patient has had the wound for <5 months prior to presenting for treatment Timing: Pain in wound is Intermittent (comes and goes Context: The wound appeared gradually over time Modifying Factors: Consults to this date include: local care was done as per the medication given by the PCP which may have been medihoney Associated Signs and Symptoms: Patient reports having difficulty standing for long periods. 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 In the past she was seen for a decubitus ulcer on her  right gluteal area. She is known to have diabetes mellitus type 1 and her most recent A1c was 7.2% Past medical history significant for leukopenia, cholelithiasis, hypertension, chronic diastolic CHF, coronary artery disease,type 1 diabetes mellitus, rheumatoid arthritis, collagen vascular disease, ovarian cancer, status post CABG in 2014 and status post Port-A-Cath insertion by Dr. Leotis Pain in October 2016. She has quit smoking about 20 years ago. She was advised to use Medihoney with calcium alginate  pads to be applied over the wound. 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 01/07/2016 -- x-ray of the left heel done -- IMPRESSION: No radiographic evidence of osteomyelitis. If this remains a clinical concern, recommend MR imaging. 02/05/16: returns today for f/u. denies fever, chills, body aches or malaise. no interval changes regarding health status. Electronic Signature(s) Signed: 02/05/2016 4:21:25 PM By: Londell Moh FNP Entered By: Londell Moh on 02/05/2016 12:00:20 [name] ([id number]) -------------------------------------------------------------------------------- Physical Exam Details Patient Name: [name], [name] 02/05/2016 10:45 Date of Service: AM Medical Record [id number] Number: Patient Account Number: [id number] January 21, 1946 (69 y.o. Treating RN: Baruch Gouty, RN, BSN, Velva Harman Date of Birth/Sex: Female) Other Clinician: Primary Care Physician: Caryl Bis, ERIC Treating Londell Moh Referring Physician: Caryl Bis, ERIC Physician/Extender: Suella Grove in Treatment: 5 Constitutional Patient's appearance is neat and clean. Appears in no acute distress. Well  nourished and well developed.Marland Kitchen Respiratory Respiratory effort is easy and symmetric bilaterally. Rate is normal at rest and on room air.. Cardiovascular Extremities are free of varicosities, clubbing or edema. Peripheral pulses strong and equal. Capillary refill < 3 seconds.. Psychiatric Judgement and insight intact.. Alert and oriented times 3.. No evidence of depression, anxiety, or agitation. Calm, cooperative, and communicative. Appropriate interactions and affect.. Electronic Signature(s) Signed: 02/05/2016 4:21:25 PM By: Londell Moh FNP Entered By: Londell Moh on 02/05/2016 12:00:53 [name] ([id number]) -------------------------------------------------------------------------------- Physician Orders Details Patient Name: [name], [name] 02/05/2016 10:45 Date of Service: AM Medical Record [id number] Number: Patient Account Number: [id number] 05-20-1945 (70 y.o. Treating RN: Afful, RN, BSN, Velva Harman Date of Birth/Sex: Female) Other Clinician: Primary Care Physician: Caryl Bis, ERIC Treating Londell Moh Referring Physician: Caryl Bis, ERIC Physician/Extender: Suella Grove in Treatment: 5 Verbal / Phone Orders: Yes Clinician: Afful, RN, BSN, Rita Read Back and Verified: Yes Diagnosis Coding Wound Cleansing Wound #2 Left Calcaneus o Cleanse wound with mild soap and water o May Shower, gently pat wound dry prior to applying new dressing. Anesthetic Wound #2 Left Calcaneus o Topical Lidocaine 4% cream applied to wound bed prior to debridement Skin Barriers/Peri-Wound Care Wound #2 Left Calcaneus o Skin Prep Primary Wound Dressing Wound #2 Left Calcaneus o Santyl Ointment - on packing strip o Medihoney gel - Use if you cannot afford SAntyl Secondary Dressing Wound #2 Left Calcaneus o Dry Gauze o Boardered Foam Dressing Dressing Change Frequency Wound #2 Left Calcaneus o Change dressing every day. Follow-up Appointments Wound #2 Left  Calcaneus o Return Appointment in 1 week. Off-Loading Wound #2 Left Calcaneus SHONTAI, VANDOVER. ([id number]) o Heel suspension boot to: - ELevate on pillows Additional Orders / Instructions Wound #2 Left Calcaneus o Increase protein intake. o Activity as tolerated o Other: - INclude Vitamin A, C, ZInc, MVI in diet Medications-please add to medication list. Wound #2 Left Calcaneus o Santyl Enzymatic Ointment Electronic Signature(s) Signed: 02/05/2016 3:48:46 PM By: Regan Lemming BSN, RN Signed: 02/05/2016 4:21:25 PM By: Londell Moh FNP Entered By: Regan Lemming on 02/05/2016 10:58:21 [name] ([id number]) -------------------------------------------------------------------------------- Problem List Details Patient Name: [name], [name] 02/05/2016 10:45 Date of Service: AM Medical Record [id number] Number: Patient Account Number: [id number] 20-Jul-1945 (70 y.o. Treating RN: Baruch Gouty, RN, BSN, Velva Harman Date of Birth/Sex: Female) Other Clinician: Primary Care Physician: Caryl Bis,  ERIC Treating Londell Moh Referring Physician: Caryl Bis, ERIC Physician/Extender: Suella Grove in Treatment: 5 Active Problems ICD-10 Encounter Code Description Active Date Diagnosis E10.621 Type 1 diabetes mellitus with foot ulcer 12/31/2015 Yes L89.623 Pressure ulcer of left heel, stage 3 12/31/2015 Yes Z92.21 Personal history of antineoplastic chemotherapy 12/31/2015 Yes Z79.52 Long term (current) use of systemic steroids 12/31/2015 Yes Inactive Problems Resolved Problems Electronic Signature(s) Signed: 02/05/2016 4:21:25 PM By: Londell Moh FNP Entered By: Londell Moh on 02/05/2016 11:59:22 Kleiber, [name] ([id number]) -------------------------------------------------------------------------------- Progress Note Details Patient Name: [name], [name] 02/05/2016 10:45 Date of Service: AM Medical Record [id number] Number: Patient Account Number: [id number] [birth year] (70  y.o. Treating RN: Baruch Gouty, RN, BSN, Velva Harman Date of Birth/Sex: Female) Other Clinician: Primary Care Physician: Caryl Bis, ERIC Treating Londell Moh Referring Physician: Caryl Bis, ERIC Physician/Extender: Suella Grove in Treatment: 5 Subjective Chief Complaint Information obtained from Patient Patient is at the clinic for treatment of an open pressure ulcer to the left heel which she has for over 5 months History of Present Illness (HPI) The following HPI elements were documented for the patient's wound: Location: ulcerated on the right heel Quality: Patient reports experiencing a dull pain to affected area(s). Severity: Patient states wound are getting worse. Duration: Patient has had the wound for Timing: Pain in wound is Intermittent (comes and goes Context: The wound appeared gradually over time Modifying Factors: Consults to this date include: local care was done as per the medication given by the PCP which may have been medihoney Associated Signs and Symptoms: Patient reports having difficulty standing for long periods. 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 In the past she was seen for a decubitus ulcer on her right gluteal area. She is known to have diabetes mellitus type 1 and her most recent A1c was 7.2% Past medical history significant for leukopenia, cholelithiasis, hypertension, chronic diastolic CHF, coronary artery disease,type 1 diabetes mellitus, rheumatoid arthritis, collagen vascular disease, ovarian cancer, status post CABG in 2014 and status post Port-A-Cath insertion by Dr. Leotis Pain in October 2016. She has quit smoking about 20 years ago. She was advised to use Medihoney with calcium alginate pads to be applied over the wound. 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 01/07/2016 -- x-ray of the left heel done -- IMPRESSION: No radiographic evidence of osteomyelitis. If this remains a clinical concern, recommend MR imaging. 02/05/16: returns today for f/u. denies fever, chills, body aches or malaise. no interval changes regarding health status. [name], [name] ([id number]) Objective Constitutional Patient's appearance is neat and clean. Appears in no acute distress. Well nourished and well developed.. Vitals Time Taken: 10:45 AM, Height: 63 in, Weight: 130 lbs, BMI: 23, Temperature: 97.4 F, Pulse: 74 bpm, Respiratory Rate: 17 breaths/min, Blood Pressure: 126/43 mmHg. Respiratory Respiratory effort is easy and symmetric bilaterally. Rate is normal at rest and on room air.. Cardiovascular Extremities are free of varicosities, clubbing or edema. Peripheral pulses strong and equal. Capillary refill < 3 seconds.. Psychiatric Judgement and insight intact.. Alert and oriented times 3.. No evidence of  depression, anxiety, or agitation. Calm, cooperative, and communicative. Appropriate interactions and affect.. Integumentary (Hair, Skin) Wound #2 status is Open. Original cause of wound was Pressure Injury. The wound is located on the Left Calcaneus. The wound measures 0.5cm length x 0.5cm width x 1cm depth; 0.196cm^2 area and 0.196cm^3 volume. The wound is limited to skin breakdown. There is no tunneling or undermining noted. There is a medium amount of serosanguineous drainage noted. The wound margin is flat and intact. There is small (1-33%) pink, pale granulation within the wound bed. There is a medium (34-66%) amount of necrotic tissue within the wound bed including  Adherent Slough. The periwound skin appearance exhibited: Maceration, Moist. The periwound skin appearance did not exhibit: Callus, Crepitus, Excoriation, Fluctuance, Friable, Induration, Localized Edema, Rash, Scarring, Dry/Scaly, Atrophie Blanche, Cyanosis, Ecchymosis, Hemosiderin Staining, Mottled, Pallor, Rubor, Erythema. Periwound temperature was noted as No Abnormality. The periwound has tenderness on palpation. Assessment Active Problems ICD-10 E10.621 - Type 1 diabetes mellitus with foot ulcer L89.623 - Pressure ulcer of left heel, stage 3 [name], [name]. ([id number]) Z92.21 - Personal history of antineoplastic chemotherapy Z79.52 - Long term (current) use of systemic steroids Diagnoses ICD-10 E10.621: Type 1 diabetes mellitus with foot ulcer L89.623: Pressure ulcer of left heel, stage 3 Z92.21: Personal history of antineoplastic chemotherapy Z79.52: Long term (current) use of systemic steroids Procedures Wound #2 Wound #2 is a Pressure Ulcer located on the Left Calcaneus . There was a Skin/Subcutaneous Tissue Debridement [id number]) debridement with total area of 0.25 sq cm performed by Londell Moh, NP. with the following instrument(s): Curette to remove Non-Viable tissue/material including Fibrin/Slough and Subcutaneous after achieving pain control using Lidocaine 4% Topical Solution. A time out was conducted at 10:55, prior to the start of the procedure. A Minimum amount of bleeding was controlled with Pressure. The procedure was tolerated well with a pain level of 0 throughout and a pain level of 0 following the procedure. Post Debridement Measurements: 0.5cm length x 0.5cm width x 1cm depth; 0.196cm^3 volume. Post debridement Stage noted as Category/Stage III. Character of Wound/Ulcer Post Debridement is stable. Severity of Tissue Post Debridement is: Fat layer exposed. Post procedure Diagnosis Wound #2: Same as Pre-Procedure Plan Wound Cleansing: Wound #2 Left  Calcaneus: Cleanse wound with mild soap and water May Shower, gently pat wound dry prior to applying new dressing. Anesthetic: Wound #2 Left Calcaneus: Topical Lidocaine 4% cream applied to wound bed prior to debridement Skin Barriers/Peri-Wound Care: Wound #2 Left Calcaneus: Skin Prep Primary Wound Dressing: Wound #2 Left Calcaneus: Santyl Ointment - on packing strip [name], [name]. ([id number]) Medihoney gel - Use if you cannot afford SAntyl Secondary Dressing: Wound #2 Left Calcaneus: Dry Gauze Boardered Foam Dressing Dressing Change Frequency: Wound #2 Left Calcaneus: Change dressing every day. Follow-up Appointments: Wound #2 Left Calcaneus: Return Appointment in 1 week. Off-Loading: Wound #2 Left Calcaneus: Heel suspension boot to: - ELevate on pillows Additional Orders / Instructions: Wound #2 Left Calcaneus: Increase protein intake. Activity as tolerated Other: - INclude Vitamin A, C, ZInc, MVI in diet Medications-please add to medication list.: Wound #2 Left Calcaneus: Santyl Enzymatic Ointment Follow-Up Appointments: A Patient Clinical Summary of Care was provided to sr Electronic Signature(s) Signed: 02/24/2016 4:13:02 PM By: Londell Moh FNP Previous Signature: 02/05/2016 4:21:25 PM Version By: Londell Moh FNP Entered By: Londell Moh on 02/24/2016 15:59:21 [name], [name] ([id number]) -------------------------------------------------------------------------------- [place] Details Patient Name: [name] Date of Service: 02/05/2016 Medical Record Patient Account Number: [id number] Number: Afful, RN,  BSN, Treating RN: 02-08-1946 (70 y.o. Velva Harman Date of Birth/Sex: Female) Other Clinician: Primary Care Physician: Caryl Bis, ERIC Treating Londell Moh Referring Physician: Caryl Bis, ERIC Physician/Extender: Suella Grove in Treatment: 5 Diagnosis Coding ICD-10 Codes Code Description E10.621 Type 1 diabetes mellitus with  foot ulcer L89.623 Pressure ulcer of left heel, stage 3 Z92.21 Personal history of antineoplastic chemotherapy Z79.52 Long term (current) use of systemic steroids Facility Procedures CPT4 Code: [id number] Description: [id number] - DEB SUBQ TISSUE 20 SQ CM/< ICD-10 Description Diagnosis E10.621 Type 1 diabetes mellitus with foot ulcer L89.623 Pressure ulcer of left heel, stage 3 Modifier: Quantity: 1 Physician Procedures CPT4 Code: [id number] Description: [id number] - WC PHYS SUBQ TISS 20 SQ CM ICD-10 Description Diagnosis E10.621 Type 1 diabetes mellitus with foot ulcer L89.623 Pressure ulcer of left heel, stage 3 Modifier: Quantity: 1 Electronic Signature(s) Signed: 02/05/2016 4:21:25 PM By: Londell Moh FNP Entered By: Londell Moh on 02/05/2016 12:01:19

## 2016-02-06 NOTE — Progress Notes (Signed)
RINKI, JURKOWSKI ([id number]) Visit Report for 02/05/2016 Arrival Information Details Patient Name: [name], [name] Date of Service: 02/05/2016 10:45 AM Medical Record Number: [id number] Patient Account Number: [id number] Date of Birth/Sex: 1945/08/24 (70 y.o. Female) Treating RN: Afful, RN, BSN, Velva Harman Primary Care Physician: Caryl Bis, ERIC Other Clinician: Referring Physician: Caryl Bis, ERIC Treating Physician/Extender: Loistine Chance in Treatment: 5 Visit Information History Since Last Visit All ordered tests and consults were completed: No Patient Arrived: [name] Added or deleted any medications: No Arrival Time: 10:43 Any new allergies or adverse reactions: No Accompanied By: son Had a fall or experienced change in No Transfer Assistance: None activities of daily living that may affect Patient Identification Verified: Yes risk of falls: Secondary Verification Process Yes Signs or symptoms of abuse/neglect since last No Completed: visito Patient Requires Transmission- No Hospitalized since last visit: No Based Precautions: Has Dressing in Place as Prescribed: Yes Patient Has Alerts: Yes Pain Present Now: No Patient Alerts: Patient on Blood Thinner lovenox BID DMII Electronic Signature(s) Signed: 02/05/2016 3:48:46 PM By: Regan Lemming BSN, RN Entered By: Regan Lemming on 02/05/2016 11:05:03 [name] ([id number]) -------------------------------------------------------------------------------- Encounter Discharge Information Details Patient Name: [name] Date of Service: 02/05/2016 10:45 AM Medical Record Number: [id number] Patient Account Number: [id number] Date of Birth/Sex: 08-25-1945 (70 y.o. Female) Treating RN: Baruch Gouty, RN, BSN, Velva Harman Primary Care Physician: Caryl Bis, ERIC Other Clinician: Referring Physician: Caryl Bis, ERIC Treating Physician/Extender: Loistine Chance in Treatment: 5 Encounter Discharge Information  Items Discharge Pain Level: 0 Discharge Condition: Stable Ambulatory Status: Cane Discharge Destination: Home Transportation: Private Auto Accompanied By: son Schedule Follow-up Appointment: No Medication Reconciliation completed No and provided to Patient/Care Altair Appenzeller: Provided on Clinical Summary of Care: 02/05/2016 Form Type Recipient Paper Patient sr Electronic Signature(s) Signed: 02/05/2016 3:48:46 PM By: Regan Lemming BSN, RN Previous Signature: 02/05/2016 11:02:42 AM Version By: Lorine Bears RCP, RRT, CHT Entered By: Regan Lemming on 02/05/2016 11:04:29 [name] ([id number]) -------------------------------------------------------------------------------- Lower Extremity Assessment Details Patient Name: [name] Date of Service: 02/05/2016 10:45 AM Medical Record Number: [id number] Patient Account Number: [id number] Date of Birth/Sex: [birth year] (70 y.o. Female) Treating RN: Afful, RN, BSN, Velva Harman Primary Care Physician: Caryl Bis, ERIC Other Clinician: Referring Physician: Caryl Bis, ERIC Treating Physician/Extender: Loistine Chance in Treatment: 5 Vascular Assessment Pulses: Posterior Tibial Dorsalis Pedis Palpable: [Left:Yes] Extremity colors, hair growth, and conditions: Extremity Color: [Left:Normal] Hair Growth on Extremity: [Left:No] Temperature of Extremity: [Left:Warm] Capillary Refill: [Left:< 3 seconds] Toe Nail Assessment Left: Right: Thick: No Discolored: No Deformed: No Improper Length and Hygiene: No Electronic Signature(s) Signed: 02/05/2016 3:48:46 PM By: Regan Lemming BSN, RN Entered By: Regan Lemming on 02/05/2016 10:48:20 [name] ([id number]) -------------------------------------------------------------------------------- Multi Wound Chart Details Patient Name: [name] Date of Service: 02/05/2016 10:45 AM Medical Record Number: [id number] Patient Account Number: [id number] Date of  Birth/Sex: [birth year] (70 y.o. Female) Treating RN: Baruch Gouty, RN, BSN, Velva Harman Primary Care Physician: Caryl Bis, ERIC Other Clinician: Referring Physician: Caryl Bis, ERIC Treating Physician/Extender: Loistine Chance in Treatment: 5 Vital Signs Height(in): 63 Pulse(bpm): 74 Weight(lbs): 130 Blood Pressure 126/43 (mmHg): Body Mass Index(BMI): 23 Temperature(F): 97.4 Respiratory Rate 17 (breaths/min): Photos: [2:No Photos] [N/A:N/A] Wound Location: [2:Left Calcaneus] [N/A:N/A] Wounding Event: [2:Pressure Injury] [N/A:N/A] Primary Etiology: [2:Pressure Ulcer] [N/A:N/A] Secondary Etiology: [2:Diabetic Wound/Ulcer of the Lower Extremity] [N/A:N/A] Comorbid History: [2:Cataracts, Type I Diabetes, Rheumatoid Arthritis, Osteoarthritis, Neuropathy, Received Chemotherapy] [N/A:N/A] Date Acquired: [2:07/15/2015] [N/A:N/A] Weeks of Treatment: [2:5] [N/A:N/A] Wound Status: [2:Open] [N/A:N/A] Measurements L x W x D  0.5x0.5x1 [N/A:N/A] (cm) Area (cm) : [2:0.196] [N/A:N/A] Volume (cm) : [2:0.196] [N/A:N/A] % Reduction in Area: [2:16.90%] [N/A:N/A] % Reduction in Volume: -108.50% [N/A:N/A] Classification: [2:Category/Stage III] [N/A:N/A] HBO Classification: [2:Grade 1] [N/A:N/A] Exudate Amount: [2:Medium] [N/A:N/A] Exudate Type: [2:Serosanguineous] [N/A:N/A] Exudate Color: [2:red, brown] [N/A:N/A] Wound Margin: [2:Flat and Intact] [N/A:N/A] Granulation Amount: [2:Small (1-33%)] [N/A:N/A] Granulation Quality: [2:Pink, Pale] [N/A:N/A] Necrotic Amount: [2:Medium (34-66%)] [N/A:N/A] Exposed Structures: [N/A:N/A] Fascia: No Fat: No Tendon: No Muscle: No Joint: No Bone: No Limited to Skin Breakdown Epithelialization: None N/A N/A Periwound Skin Texture: Edema: No N/A N/A Excoriation: No Induration: No Callus: No Crepitus: No Fluctuance: No Friable: No Rash: No Scarring: No Periwound Skin Maceration: Yes N/A N/A Moisture: Moist: Yes Dry/Scaly: No Periwound Skin Color:  Atrophie Blanche: No N/A N/A Cyanosis: No Ecchymosis: No Erythema: No Hemosiderin Staining: No Mottled: No Pallor: No Rubor: No Temperature: No Abnormality N/A N/A Tenderness on Yes N/A N/A Palpation: Wound Preparation: Ulcer Cleansing: N/A N/A Rinsed/Irrigated with Saline Topical Anesthetic Applied: Other: lidocaine 4% Treatment Notes Electronic Signature(s) Signed: 02/05/2016 3:48:46 PM By: Regan Lemming BSN, RN Entered By: Regan Lemming on 02/05/2016 10:49:46 [name] ([id number]) -------------------------------------------------------------------------------- Multi-Disciplinary Care Plan Details Patient Name: [name] Date of Service: 02/05/2016 10:45 AM Medical Record Number: [id number] Patient Account Number: [id number] Date of Birth/Sex: 05-25-1945 (70 y.o. Female) Treating RN: Afful, RN, BSN, Velva Harman Primary Care Physician: Caryl Bis, ERIC Other Clinician: Referring Physician: Caryl Bis, ERIC Treating Physician/Extender: Loistine Chance in Treatment: 5 Active Inactive Orientation to the Wound Care Program Nursing Diagnoses: Knowledge deficit related to the wound healing center program Goals: Patient/caregiver will verbalize understanding of the [facility] Date Initiated: 12/31/2015 Goal Status: Active Interventions: Provide education on orientation to the wound center Notes: Pressure Nursing Diagnoses: Knowledge deficit related to causes and risk factors for pressure ulcer development Knowledge deficit related to management of pressures ulcers Potential for impaired tissue integrity related to pressure, friction, moisture, and shear Goals: Patient will remain free from development of additional pressure ulcers Date Initiated: 12/31/2015 Goal Status: Active Patient will remain free of pressure ulcers Date Initiated: 12/31/2015 Goal Status: Active Patient/caregiver will verbalize risk factors for pressure ulcer  development Date Initiated: 12/31/2015 Goal Status: Active Patient/caregiver will verbalize understanding of pressure ulcer management Date Initiated: 12/31/2015 Goal Status: Active Interventions: [name], [name] ([id number]) Assess: immobility, friction, shearing, incontinence upon admission and as needed Assess offloading mechanisms upon admission and as needed Assess potential for pressure ulcer upon admission and as needed Provide education on pressure ulcers Treatment Activities: Patient referred for pressure reduction/relief devices : 12/31/2015 Notes: Wound/Skin Impairment Nursing Diagnoses: Impaired tissue integrity Knowledge deficit related to smoking impact on wound healing Knowledge deficit related to ulceration/compromised skin integrity Goals: Patient/caregiver will verbalize understanding of skin care regimen Date Initiated: 12/31/2015 Goal Status: Active Ulcer/skin breakdown will have a volume reduction of 30% by week 4 Date Initiated: 12/31/2015 Goal Status: Active Ulcer/skin breakdown will have a volume reduction of 50% by week 8 Date Initiated: 12/31/2015 Goal Status: Active Ulcer/skin breakdown will have a volume reduction of 80% by week 12 Date Initiated: 12/31/2015 Goal Status: Active Ulcer/skin breakdown will heal within 14 weeks Date Initiated: 12/31/2015 Goal Status: Active Interventions: Assess patient/caregiver ability to obtain necessary supplies Assess patient/caregiver ability to perform ulcer/skin care regimen upon admission and as needed Assess ulceration(s) every visit Provide education on ulcer and skin care Treatment Activities: Skin care regimen initiated : 12/31/2015 Topical wound management initiated : 12/31/2015 Notes: [name], [name]. (  [id number]) Electronic Signature(s) Signed: 02/05/2016 3:48:46 PM By: Regan Lemming BSN, RN Entered By: Regan Lemming on 02/05/2016 10:49:36 [name]  ([id number]) -------------------------------------------------------------------------------- Pain Assessment Details Patient Name: [name] Date of Service: 02/05/2016 10:45 AM Medical Record Number: [id number] Patient Account Number: [id number] Date of Birth/Sex: July 17, 1945 (70 y.o. Female) Treating RN: Baruch Gouty, RN, BSN, Velva Harman Primary Care Physician: Caryl Bis, ERIC Other Clinician: Referring Physician: Caryl Bis, ERIC Treating Physician/Extender: Loistine Chance in Treatment: 5 Active Problems Location of Pain Severity and Description of Pain Patient Has Paino No Site Locations With Dressing Change: No Pain Management and Medication Current Pain Management: Electronic Signature(s) Signed: 02/05/2016 3:48:46 PM By: Regan Lemming BSN, RN Entered By: Regan Lemming on 02/05/2016 10:45:09 [name] ([id number]) -------------------------------------------------------------------------------- Patient/Caregiver Education Details Patient Name: [name] Date of Service: 02/05/2016 10:45 AM Medical Record Number: [id number] Patient Account Number: [id number] Date of Birth/Gender: 1946-03-31 (70 y.o. Female) Treating RN: Baruch Gouty, RN, BSN, Velva Harman Primary Care Physician: Caryl Bis, ERIC Other Clinician: Referring Physician: Caryl Bis, ERIC Treating Physician/Extender: Loistine Chance in Treatment: 5 Education Assessment Education Provided To: Patient Education Topics Provided Pressure: Methods: Explain/Verbal Responses: State content correctly Welcome To [facility]: Methods: Explain/Verbal Responses: State content correctly Wound/Skin Impairment: Methods: Explain/Verbal Responses: State content correctly Electronic Signature(s) Signed: 02/05/2016 3:48:46 PM By: Regan Lemming BSN, RN Entered By: Regan Lemming on 02/05/2016 11:04:47 [name]  ([id number]) -------------------------------------------------------------------------------- Wound Assessment Details Patient Name: [name] Date of Service: 02/05/2016 10:45 AM Medical Record Number: [id number] Patient Account Number: [id number] Date of Birth/Sex: 15-Jan-1946 (70 y.o. Female) Treating RN: Afful, RN, BSN, [place] Primary Care Physician: Caryl Bis, ERIC Other Clinician: Referring Physician: Caryl Bis, ERIC Treating Physician/Extender: Loistine Chance in Treatment: 5 Wound Status Wound Number: 2 Primary Pressure Ulcer Etiology: Wound Location: Left Calcaneus Secondary Diabetic Wound/Ulcer of the Lower Wounding Event: Pressure Injury Etiology: Extremity Date Acquired: 07/15/2015 Wound Open Weeks Of Treatment: 5 Status: Clustered Wound: No Comorbid Cataracts, Type I Diabetes, History: Rheumatoid Arthritis, Osteoarthritis, Neuropathy, Received Chemotherapy Photos Photo Uploaded By: Regan Lemming on 02/05/2016 13:25:01 Wound Measurements Length: (cm) 0.5 Width: (cm) 0.5 Depth: (cm) 1 Area: (cm) 0.196 Volume: (cm) 0.196 % Reduction in Area: 16.9% % Reduction in Volume: -108.5% Epithelialization: None Tunneling: No Undermining: No Wound Description Classification: Category/Stage III Diabetic Severity [name]): Grade 1 [name], [name]. ([id number]) Foul Odor After Cleansing: No Wound Margin: Flat and Intact Exudate Amount: Medium Exudate Type: Serosanguineous Exudate Color: red, brown Wound Bed Granulation Amount: Small (1-33%) Exposed Structure Granulation Quality: Pink, Pale Fascia Exposed: No Necrotic Amount: Medium (34-66%) Fat Layer Exposed: No Necrotic Quality: Adherent Slough Tendon Exposed: No Muscle Exposed: No Joint Exposed: No Bone Exposed: No Limited to Skin Breakdown Periwound Skin Texture Texture Color No Abnormalities Noted: No No Abnormalities Noted: No Callus: No Atrophie Blanche: No Crepitus: No Cyanosis:  No Excoriation: No Ecchymosis: No Fluctuance: No Erythema: No Friable: No Hemosiderin Staining: No Induration: No Mottled: No Localized Edema: No Pallor: No Rash: No Rubor: No Scarring: No Temperature / Pain Moisture Temperature: No Abnormality No Abnormalities Noted: No Tenderness on Palpation: Yes Dry / Scaly: No Maceration: Yes Moist: Yes Wound Preparation Ulcer Cleansing: Rinsed/Irrigated with Saline Topical Anesthetic Applied: Other: lidocaine 4%, Treatment Notes Wound #2 (Left Calcaneus) 1. Cleansed with: Clean wound with Normal Saline 4. Dressing Applied: Santyl Ointment Plain packing gauze 5. Secondary Dressing Applied Bordered Foam Dressing [name], [name] ([id number]) Electronic Signature(s) Signed: 02/05/2016 3:48:46 PM By: Regan Lemming BSN, RN Entered ByBaruch Gouty,  Rita on 02/05/2016 10:48:53 [name], [name] ([id number]) -------------------------------------------------------------------------------- Vitals Details Patient Name: [name] Date of Service: 02/05/2016 10:45 AM Medical Record Number: [id number] Patient Account Number: [id number] Date of Birth/Sex: 1945-08-04 (70 y.o. Female) Treating RN: Afful, RN, BSN, [place] Primary Care Physician: Caryl Bis, ERIC Other Clinician: Referring Physician: Caryl Bis, ERIC Treating Physician/Extender: Loistine Chance in Treatment: 5 Vital Signs Time Taken: 10:45 Temperature (F): 97.4 Height (in): 63 Pulse (bpm): 74 Weight (lbs): 130 Respiratory Rate (breaths/min): 17 Body Mass Index (BMI): 23 Blood Pressure (mmHg): 126/43 Reference Range: 80 - 120 mg / dl Electronic Signature(s) Signed: 02/05/2016 3:48:46 PM By: Regan Lemming BSN, RN Entered By: Regan Lemming on 02/05/2016 10:45:43

## 2016-02-07 DIAGNOSIS — Z794 Long term (current) use of insulin: Secondary | ICD-10-CM | POA: Diagnosis not present

## 2016-02-07 DIAGNOSIS — Z87891 Personal history of nicotine dependence: Secondary | ICD-10-CM | POA: Diagnosis not present

## 2016-02-07 DIAGNOSIS — I110 Hypertensive heart disease with heart failure: Secondary | ICD-10-CM | POA: Diagnosis not present

## 2016-02-07 DIAGNOSIS — M069 Rheumatoid arthritis, unspecified: Secondary | ICD-10-CM | POA: Diagnosis not present

## 2016-02-07 DIAGNOSIS — E1040 Type 1 diabetes mellitus with diabetic neuropathy, unspecified: Secondary | ICD-10-CM | POA: Diagnosis not present

## 2016-02-07 DIAGNOSIS — I2510 Atherosclerotic heart disease of native coronary artery without angina pectoris: Secondary | ICD-10-CM | POA: Diagnosis not present

## 2016-02-07 DIAGNOSIS — L89623 Pressure ulcer of left heel, stage 3: Secondary | ICD-10-CM | POA: Diagnosis not present

## 2016-02-07 DIAGNOSIS — Z79899 Other long term (current) drug therapy: Secondary | ICD-10-CM | POA: Diagnosis not present

## 2016-02-07 DIAGNOSIS — Z9221 Personal history of antineoplastic chemotherapy: Secondary | ICD-10-CM | POA: Diagnosis not present

## 2016-02-07 DIAGNOSIS — I5032 Chronic diastolic (congestive) heart failure: Secondary | ICD-10-CM | POA: Diagnosis not present

## 2016-02-07 DIAGNOSIS — C569 Malignant neoplasm of unspecified ovary: Secondary | ICD-10-CM | POA: Diagnosis not present

## 2016-02-07 DIAGNOSIS — C787 Secondary malignant neoplasm of liver and intrahepatic bile duct: Secondary | ICD-10-CM | POA: Diagnosis not present

## 2016-02-07 DIAGNOSIS — E10621 Type 1 diabetes mellitus with foot ulcer: Secondary | ICD-10-CM | POA: Diagnosis present

## 2016-02-07 DIAGNOSIS — Z7952 Long term (current) use of systemic steroids: Secondary | ICD-10-CM | POA: Diagnosis not present

## 2016-02-09 ENCOUNTER — Ambulatory Visit
Admission: RE | Admit: 2016-02-09 | Discharge: 2016-02-09 | Disposition: A | Discharge status: Home / Self Care | Payer: Medicare Other | Source: Ambulatory Visit | Attending: Family Medicine | Admitting: Family Medicine

## 2016-02-09 DIAGNOSIS — N6489 Other specified disorders of breast: Secondary | ICD-10-CM | POA: Insufficient documentation

## 2016-02-11 ENCOUNTER — Other Ambulatory Visit: Payer: Self-pay | Admitting: Hematology and Oncology

## 2016-02-11 DIAGNOSIS — C569 Malignant neoplasm of unspecified ovary: Secondary | ICD-10-CM

## 2016-02-11 DIAGNOSIS — K750 Abscess of liver: Secondary | ICD-10-CM

## 2016-02-11 DIAGNOSIS — I82C11 Acute embolism and thrombosis of right internal jugular vein: Secondary | ICD-10-CM

## 2016-02-11 DIAGNOSIS — L8990 Pressure ulcer of unspecified site, unspecified stage: Secondary | ICD-10-CM

## 2016-02-11 DIAGNOSIS — C786 Secondary malignant neoplasm of retroperitoneum and peritoneum: Secondary | ICD-10-CM

## 2016-02-11 DIAGNOSIS — C801 Malignant (primary) neoplasm, unspecified: Secondary | ICD-10-CM

## 2016-02-12 ENCOUNTER — Encounter: Payer: Medicare Other | Admitting: Nurse Practitioner

## 2016-02-12 DIAGNOSIS — E10621 Type 1 diabetes mellitus with foot ulcer: Secondary | ICD-10-CM | POA: Diagnosis not present

## 2016-02-13 NOTE — Progress Notes (Signed)
[name], [name] ([id number]) Visit Report for 02/12/2016 Chief Complaint Document Details Patient Name: [name], [name] 02/12/2016 10:45 Date of Service: AM Medical Record [id number] Number: Patient Account Number: [id number] [birth year] (70 y.o. Treating RN: Carole Civil Date of Birth/Sex: Female) Other Clinician: Primary Care Physician: Caryl Bis, ERIC Treating Londell Moh Referring Physician: Caryl Bis, ERIC Physician/Extender: Suella Grove in Treatment: 6 Information Obtained from: Patient Chief Complaint Patient is at the clinic for treatment of an open pressure ulcer to the left heel which she has for over 5 months Electronic Signature(s) Signed: 02/12/2016 5:31:30 PM By: Londell Moh FNP Entered By: Londell Moh on 02/12/2016 12:47:14 [name] ([id number]) -------------------------------------------------------------------------------- HPI Details Patient Name: [name], [name] 02/12/2016 10:45 Date of Service: AM Medical Record [id number] Number: Patient Account Number: [id number] [birth year] (70 y.o. Treating RN: Carole Civil Date of Birth/Sex: Female) Other Clinician: Primary Care Physician: Caryl Bis, ERIC Treating Londell Moh Referring Physician: Caryl Bis, ERIC Physician/Extender: Suella Grove in Treatment: 6 History of Present Illness Location: ulcerated on the right heel Quality: Patient reports experiencing a dull pain to affected area(s). Severity: Patient states wound are getting worse. Duration: Patient has had the wound for <5 months prior to presenting for treatment Timing: Pain in wound is Intermittent (comes and goes Context: The wound appeared gradually over time Modifying Factors: Consults to this date include: local care was done as per the medication given by the PCP which may have been medihoney Associated Signs and Symptoms: Patient reports having difficulty standing for long periods. 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 In the past she was seen for a decubitus ulcer on her right gluteal area. She is known to have diabetes mellitus type 1 and her most recent A1c was 7.2% Past medical history significant for leukopenia, cholelithiasis, hypertension, chronic diastolic CHF, coronary artery disease,type 1 diabetes mellitus, rheumatoid arthritis, collagen vascular disease, ovarian cancer, status post CABG in 2014 and status post Port-A-Cath insertion by Dr. Leotis Pain in October 2016. She has quit smoking about 20 years ago. She was advised to use Medihoney with calcium alginate pads to be applied over the wound. 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 01/07/2016 -- x-ray of the left heel done -- IMPRESSION: No radiographic evidence of osteomyelitis. If this remains a clinical concern, recommend MR imaging. 02/05/16: returns today for f/u. denies fever, chills, body aches or malaise. no interval changes regarding health status. 02/12/16: pt is doing well. wound has improved. no s/s of infection. no systemic s/s of infection. She reports will be on vacation next week, and requests appointment in 2 weeks. Electronic Signature(s) Signed: 02/12/2016 5:31:30 PM By: Londell Moh FNP Entered By: Londell Moh on 02/12/2016 12:48:26 [name], [name] ([id number]) [name], [name] ([id number]) -------------------------------------------------------------------------------- Physical Exam Details Patient Name:  [name], [name] 02/12/2016 10:45 Date of Service: AM Medical Record [id number] Number: Patient Account Number: [id number] [birth year] (70 y.o. Treating RN: Carole Civil Date of Birth/Sex: Female) Other Clinician: Primary Care Physician: Caryl Bis, ERIC Treating Londell Moh Referring Physician: Caryl Bis, ERIC Physician/Extender: Suella Grove in  Treatment: 6 Constitutional Patient's appearance is neat and clean. Appears in no acute distress. Well nourished and well developed.. Ears, Nose, Mouth, and Throat Patient can hear normal speaking tones without difficulty.. Cardiovascular Pedal pulses palpable and strong bilaterally.. Extremities are free of varicosities, clubbing or edema. Peripheral pulses strong and equal. Capillary refill < 3 seconds.. Psychiatric Judgement and insight intact.. Alert and oriented times 3.. Short and long term memory intact.. No evidence of depression, anxiety, or agitation. Calm, cooperative, and communicative. Appropriate interactions and affect.. Electronic Signature(s) Signed: 02/12/2016 5:31:30 PM By: Londell Moh FNP Entered By: Londell Moh on 02/12/2016 12:48:49 [name] ([id number]) -------------------------------------------------------------------------------- Physician Orders Details Patient Name: [name], [name] 02/12/2016 10:45 Date of Service: AM Medical Record [id number] Number: Patient Account Number: [id number] 02-Jun-1945 (70 y.o. Treating RN: Carole Civil Date of Birth/Sex: Female) Other Clinician: Primary Care Physician: Caryl Bis, ERIC Treating Londell Moh Referring Physician: Caryl Bis, ERIC Physician/Extender: Suella Grove in Treatment: 6 Verbal / Phone Orders: No Diagnosis Coding Wound Cleansing Wound #2 Left Calcaneus o Cleanse wound with mild soap and water o May Shower, gently pat wound dry prior to applying new dressing. Anesthetic Wound #2 Left Calcaneus o Topical Lidocaine 4% cream  applied to wound bed prior to debridement Skin Barriers/Peri-Wound Care Wound #2 Left Calcaneus o Skin Prep Primary Wound Dressing Wound #2 Left Calcaneus o Santyl Ointment - on packing strip o Medihoney gel - Use if you cannot afford SAntyl Secondary Dressing Wound #2 Left Calcaneus o Dry Gauze o Boardered Foam Dressing Dressing Change Frequency Wound #2 Left Calcaneus o Change dressing every day. Follow-up Appointments Wound #2 Left Calcaneus o Return Appointment in 1 week. Off-Loading Wound #2 Left Calcaneus [name], [name]. ([id number]) o Heel suspension boot to: - ELevate on pillows Additional Orders / Instructions Wound #2 Left Calcaneus o Increase protein intake. o Activity as tolerated o Other: - INclude Vitamin A, C, ZInc, MVI in diet Medications-please add to medication list. Wound #2 Left Calcaneus o Santyl Enzymatic Ointment Electronic Signature(s) Signed: 02/12/2016 5:31:30 PM By: Londell Moh FNP Signed: 02/12/2016 5:52:12 PM By: Carole Civil Entered By: Carole Civil on 02/12/2016 11:41:42 Genis, Sofie Hartigan ([id number]) -------------------------------------------------------------------------------- Problem List Details Patient Name: [name], [name] 02/12/2016 10:45 Date of Service: AM Medical Record [id number] Number: Patient Account Number: [id number] 1945-11-25 (70 y.o. Treating RN: Carole Civil Date of Birth/Sex: Female) Other Clinician: Primary Care Physician: Caryl Bis, ERIC Treating Londell Moh Referring Physician: Caryl Bis, ERIC Physician/Extender: Suella Grove in Treatment: 6 Active Problems ICD-10 Encounter Code Description Active Date Diagnosis E10.621 Type 1 diabetes mellitus with foot ulcer 12/31/2015 Yes L89.623 Pressure ulcer of left heel, stage 3 12/31/2015 Yes Z92.21 Personal history of antineoplastic chemotherapy 12/31/2015 Yes Z79.52 Long term (current) use of systemic steroids 12/31/2015  Yes Inactive Problems Resolved Problems Electronic Signature(s) Signed: 02/12/2016 5:31:30 PM By: Londell Moh FNP Entered By: Londell Moh on 02/12/2016 12:47:05 [name] ([id number]) -------------------------------------------------------------------------------- Progress Note Details Patient Name: [name], [name] 02/12/2016 10:45 Date of Service: AM Medical Record [id number] Number: Patient Account Number: [id number] 05-20-1945 (70 y.o. Treating RN: Carole Civil Date of Birth/Sex: Female) Other Clinician: Primary Care Physician: Caryl Bis, ERIC Treating Londell Moh Referring Physician: Caryl Bis, ERIC Physician/Extender: Suella Grove in Treatment: 6 Subjective Chief Complaint Information obtained from Patient Patient is at the clinic for treatment of an open pressure ulcer to the left heel which she has for over 5 months History of Present Illness (HPI) The following HPI elements were documented for the patient's wound: Location: ulcerated on the right heel  Quality: Patient reports experiencing a dull pain to affected area(s). Severity: Patient states wound are getting worse. Duration: Patient has had the wound for Timing: Pain in wound is Intermittent (comes and goes Context: The wound appeared gradually over time Modifying Factors: Consults to this date include: local care was done as per the medication given by the PCP which may have been medihoney Associated Signs and Symptoms: Patient reports having difficulty standing for long periods. 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 In the past she was seen for a decubitus ulcer on her right gluteal area. She is known to have diabetes mellitus type 1 and her most recent A1c was 7.2% Past medical history significant for  leukopenia, cholelithiasis, hypertension, chronic diastolic CHF, coronary artery disease,type 1 diabetes mellitus, rheumatoid arthritis, collagen vascular disease, ovarian cancer, status post CABG in 2014 and status post Port-A-Cath insertion by Dr. Leotis Pain in October 2016. She has quit smoking about 20 years ago. She was advised to use Medihoney with calcium alginate pads to be applied over the wound. 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 01/07/2016 -- x-ray of the left heel done -- IMPRESSION: No radiographic evidence of osteomyelitis. If this remains a clinical concern, recommend MR imaging. 02/05/16: returns today for f/u. denies fever, chills, body aches or malaise. no interval changes regarding health status. [name], [name] ([id number]) 02/12/16: pt is doing well. wound has improved. no s/s of infection. no systemic s/s of infection. She reports will be on vacation next week, and requests appointment in 2 weeks. Objective Constitutional Patient's appearance is neat and clean. Appears in no acute distress. Well nourished and well developed.. Vitals Time Taken: 11:03 AM, Height: 63 in, Weight: 130 lbs, BMI: 23, Temperature: 97.6 F, Pulse: 72 bpm, Respiratory Rate: 18 breaths/min, Blood Pressure: 104/48 mmHg. Ears, Nose, Mouth, and Throat Patient can hear normal speaking tones without difficulty.. Cardiovascular Pedal pulses palpable and strong bilaterally.. Extremities are free of varicosities, clubbing or edema. Peripheral pulses strong and equal. Capillary refill < 3 seconds.. Psychiatric Judgement and insight intact.. Alert and oriented times 3.. Short and long term memory  intact.. No evidence of depression, anxiety, or agitation. Calm, cooperative, and communicative. Appropriate interactions and affect.. Integumentary (Hair, Skin) Wound #2 status is Open. Original cause of wound was Pressure Injury. The wound is located on the Left Calcaneus. The wound measures 0.4cm length x 0.5cm width x 0.6cm depth; 0.157cm^2 area and 0.094cm^3 volume. The wound is limited to skin breakdown. Tunneling has been noted at 7:00 with a maximum distance of 0.6cm. Undermining begins at 7:00 and ends at 12:00 with a maximum distance of 0.3cm. There is a medium amount of serosanguineous drainage noted. The wound margin is flat and intact. There is small (1-33%) pink, pale granulation within the wound bed. There is no necrotic tissue within the wound bed. The periwound skin appearance exhibited: Maceration, Moist. The periwound skin appearance did not exhibit: Callus, Crepitus, Excoriation, Fluctuance, Friable, Induration, Localized Edema, Rash, Scarring, Dry/Scaly, Atrophie Blanche, Cyanosis, Ecchymosis, Hemosiderin Staining, Mottled,  Pallor, Rubor, Erythema. Periwound temperature was noted as No Abnormality. The periwound has tenderness on palpation. Assessment KRISTEEN, NEDVED ([id number]) Active Problems ICD-10 E10.621 - Type 1 diabetes mellitus with foot ulcer L89.623 - Pressure ulcer of left heel, stage 3 Z92.21 - Personal history of antineoplastic chemotherapy Z79.52 - Long term (current) use of systemic steroids Diagnoses ICD-10 E10.621: Type 1 diabetes mellitus with foot ulcer L89.623: Pressure ulcer of left heel, stage 3 Z92.21: Personal history of antineoplastic chemotherapy Z79.52: Long term (current) use of systemic steroids Plan Wound Cleansing: Wound #2 Left Calcaneus: Cleanse wound with mild soap and water May Shower, gently pat wound dry prior to applying new dressing. Anesthetic: Wound #2 Left Calcaneus: Topical Lidocaine 4% cream applied to wound bed  prior to debridement Skin Barriers/Peri-Wound Care: Wound #2 Left Calcaneus: Skin Prep Primary Wound Dressing: Wound #2 Left Calcaneus: Santyl Ointment - on packing strip Medihoney gel - Use if you cannot afford SAntyl Secondary Dressing: Wound #2 Left Calcaneus: Dry Gauze Boardered Foam Dressing Dressing Change Frequency: Wound #2 Left Calcaneus: Change dressing every day. Follow-up Appointments: Wound #2 Left Calcaneus: Return Appointment in 1 week. Off-Loading: Wound #2 Left Calcaneus: Heel suspension boot to: - ELevate on pillows Additional Orders / Instructions: JULLIE, DESHOTELS ([id number]) Wound #2 Left Calcaneus: Increase protein intake. Activity as tolerated Other: - INclude Vitamin A, C, ZInc, MVI in diet Medications-please add to medication list.: Wound #2 Left Calcaneus: Santyl Enzymatic Ointment Follow-Up Appointments: A Patient Clinical Summary of Care was provided to SR 1. discussed clinical findings and implications with pt. all questions were answered. 2. RTC 2 weeks per her request-vacation next week. Electronic Signature(s) Signed: 02/12/2016 5:31:30 PM By: Londell Moh FNP Entered By: Londell Moh on 02/12/2016 12:49:29 [name] ([id number]) -------------------------------------------------------------------------------- [place] Details Patient Name: [name] Date of Service: 02/12/2016 Medical Record Number: [id number] Patient Account Number: [id number] Date of Birth/Sex: 1945/11/30 (70 y.o. Female) Treating RN: Carole Civil Primary Care Physician: Caryl Bis, ERIC Other Clinician: Referring Physician: Caryl Bis, ERIC Treating Physician/Extender: Loistine Chance in Treatment: 6 Diagnosis Coding ICD-10 Codes Code Description E10.621 Type 1 diabetes mellitus with foot ulcer L89.623 Pressure ulcer of left heel, stage 3 Z92.21 Personal history of antineoplastic chemotherapy Z79.52 Long term (current) use of  systemic steroids Facility Procedures CPT4 Code: [id number] Description: [id number] - WOUND CARE VISIT-LEV 3 EST PT Modifier: Quantity: 1 Physician Procedures CPT4 Code: [id number] Description: [id number] - WC PHYS LEVEL 2 - EST PT ICD-10 Description Diagnosis E10.621 Type 1 diabetes mellitus with foot ulcer L89.623 Pressure ulcer of left heel, stage 3 Modifier: Quantity: 1 Electronic Signature(s) Signed: 02/12/2016 5:31:30 PM By: Londell Moh FNP Entered By: Londell Moh on 02/12/2016 12:49:46

## 2016-02-13 NOTE — Progress Notes (Signed)
ALFREEDA, SHAUT ([id number]) Visit Report for 02/12/2016 Arrival Information Details Patient Name: [name], [name] Date of Service: 02/12/2016 10:45 AM Medical Record Number: [id number] Patient Account Number: [id number] Date of Birth/Sex: 07-25-1945 (70 y.o. Female) Treating RN: Carole Civil Primary Care Physician: Caryl Bis, ERIC Other Clinician: Referring Physician: Caryl Bis, ERIC Treating Physician/Extender: Loistine Chance in Treatment: 6 Visit Information History Since Last Visit All ordered tests and consults were completed: No Patient Arrived: Ambulatory Added or deleted any medications: No Arrival Time: 11:01 Any new allergies or adverse reactions: No Accompanied By: husband Had a fall or experienced change in No Transfer Assistance: None activities of daily living that may affect Patient Identification Verified: Yes risk of falls: Secondary Verification Process Yes Signs or symptoms of abuse/neglect since last No Completed: visito Patient Requires Transmission- No Hospitalized since last visit: No Based Precautions: Has Dressing in Place as Prescribed: Yes Patient Has Alerts: Yes Pain Present Now: No Patient Alerts: Patient on Blood Thinner lovenox BID DMII Electronic Signature(s) Signed: 02/12/2016 5:52:12 PM By: Carole Civil Entered By: Carole Civil on 02/12/2016 11:02:40 [name] ([id number]) -------------------------------------------------------------------------------- Clinic Level of Care Assessment Details Patient Name: [name] Date of Service: 02/12/2016 10:45 AM Medical Record Number: [id number] Patient Account Number: [id number] Date of Birth/Sex: 1945-10-05 (70 y.o. Female) Treating RN: Carole Civil Primary Care Physician: Caryl Bis, ERIC Other Clinician: Referring Physician: Caryl Bis, ERIC Treating Physician/Extender: Loistine Chance in Treatment: 6 Clinic Level of Care Assessment Items TOOL 4  Quantity Score X - Use when only an EandM is performed on FOLLOW-UP visit 1 0 ASSESSMENTS - Nursing Assessment / Reassessment X - Reassessment of Co-morbidities (includes updates in patient status) 1 10 X - Reassessment of Adherence to Treatment Plan 1 5 ASSESSMENTS - Wound and Skin Assessment / Reassessment X - Simple Wound Assessment / Reassessment - one wound 1 5 []  - Complex Wound Assessment / Reassessment - multiple wounds 0 []  - Dermatologic / Skin Assessment (not related to wound area) 0 ASSESSMENTS - Focused Assessment []  - Circumferential Edema Measurements - multi extremities 0 []  - Nutritional Assessment / Counseling / Intervention 0 X - Lower Extremity Assessment (monofilament, tuning fork, pulses) 1 5 []  - Peripheral Arterial Disease Assessment (using hand held doppler) 0 ASSESSMENTS - Ostomy and/or Continence Assessment and Care []  - Incontinence Assessment and Management 0 []  - Ostomy Care Assessment and Management (repouching, etc.) 0 PROCESS - Coordination of Care X - Simple Patient / Family Education for ongoing care 1 15 []  - Complex (extensive) Patient / Family Education for ongoing care 0 X - Staff obtains Programmer, systems, Records, Test Results / Process Orders 1 10 []  - Staff telephones HHA, Nursing Homes / Clarify orders / etc 0 []  - Routine Transfer to another Facility (non-emergent condition) 0 MURRY, CITIZEN. ([id number]) []  - Routine Hospital Admission (non-emergent condition) 0 []  - New Admissions / Biomedical engineer / Ordering NPWT, Apligraf, etc. 0 []  - Emergency Hospital Admission (emergent condition) 0 X - Simple Discharge Coordination 1 10 []  - Complex (extensive) Discharge Coordination 0 PROCESS - Special Needs []  - Pediatric / Minor Patient Management 0 []  - Isolation Patient Management 0 []  - Hearing / Language / Visual special needs 0 []  - Assessment of Community assistance (transportation, D/C planning, etc.) 0 []  - Additional assistance /  Altered mentation 0 []  - Support Surface(s) Assessment (bed, cushion, seat, etc.) 0 INTERVENTIONS - Wound Cleansing / Measurement X - Simple Wound Cleansing - one wound 1 5 []  - Complex  Wound Cleansing - multiple wounds 0 X - Wound Imaging (photographs - any number of wounds) 1 5 []  - Wound Tracing (instead of photographs) 0 X - Simple Wound Measurement - one wound 1 5 []  - Complex Wound Measurement - multiple wounds 0 INTERVENTIONS - Wound Dressings X - Small Wound Dressing one or multiple wounds 1 10 []  - Medium Wound Dressing one or multiple wounds 0 []  - Large Wound Dressing one or multiple wounds 0 []  - Application of Medications - topical 0 []  - Application of Medications - injection 0 INTERVENTIONS - Miscellaneous []  - External ear exam 0 ANWEN, STEEPLES. ([id number]) []  - Specimen Collection (cultures, biopsies, blood, body fluids, etc.) 0 []  - Specimen(s) / Culture(s) sent or taken to Lab for analysis 0 []  - Patient Transfer (multiple staff / Harrel Lemon Lift / Similar devices) 0 []  - Simple Staple / Suture removal (25 or less) 0 []  - Complex Staple / Suture removal (26 or more) 0 []  - Hypo / Hyperglycemic Management (close monitor of Blood Glucose) 0 []  - Ankle / Brachial Index (ABI) - do not check if billed separately 0 X - Vital Signs 1 5 Has the patient been seen at the hospital within the last three years: Yes Total Score: 90 Level Of Care: New/Established - Level 3 Electronic Signature(s) Signed: 02/12/2016 5:52:12 PM By: Carole Civil Entered By: Carole Civil on 02/12/2016 11:55:08 [name] ([id number]) -------------------------------------------------------------------------------- Encounter Discharge Information Details Patient Name: [name] Date of Service: 02/12/2016 10:45 AM Medical Record Number: [id number] Patient Account Number: [id number] Date of Birth/Sex: [birth year] (70 y.o. Female) Treating RN: Carole Civil Primary Care Physician:  Caryl Bis, ERIC Other Clinician: Referring Physician: Caryl Bis, ERIC Treating Physician/Extender: Loistine Chance in Treatment: 6 Encounter Discharge Information Items Schedule Follow-up Appointment: No Medication Reconciliation completed and provided to Patient/Care No Bellamy Rubey: Provided on Clinical Summary of Care: 02/12/2016 Form Type Recipient Paper Patient SR Electronic Signature(s) Signed: 02/12/2016 12:07:03 PM By: Ruthine Dose Entered By: Ruthine Dose on 02/12/2016 12:07:03 [name] ([id number]) -------------------------------------------------------------------------------- Lower Extremity Assessment Details Patient Name: [name] Date of Service: 02/12/2016 10:45 AM Medical Record Number: [id number] Patient Account Number: [id number] Date of Birth/Sex: October 06, 1945 (70 y.o. Female) Treating RN: Carole Civil Primary Care Physician: Caryl Bis, ERIC Other Clinician: Referring Physician: Caryl Bis, ERIC Treating Physician/Extender: Loistine Chance in Treatment: 6 Vascular Assessment Pulses: Posterior Tibial Dorsalis Pedis Palpable: [Left:Yes] Extremity colors, hair growth, and conditions: Extremity Color: [Left:Normal] Temperature of Extremity: [Left:Cool] Capillary Refill: [Left:< 3 seconds] Dependent Rubor: [Left:No] Lipodermatosclerosis: [Left:No] Toe Nail Assessment Left: Right: Thick: No Discolored: No Deformed: No Improper Length and Hygiene: No Electronic Signature(s) Signed: 02/12/2016 5:52:12 PM By: Carole Civil Entered By: Carole Civil on 02/12/2016 11:11:18 [name] ([id number]) -------------------------------------------------------------------------------- Multi Wound Chart Details Patient Name: [name] Date of Service: 02/12/2016 10:45 AM Medical Record Number: [id number] Patient Account Number: [id number] Date of Birth/Sex: [birth year] (70 y.o. Female) Treating RN: Carole Civil Primary  Care Physician: Caryl Bis, ERIC Other Clinician: Referring Physician: Caryl Bis, ERIC Treating Physician/Extender: Loistine Chance in Treatment: 6 Vital Signs Height(in): 63 Pulse(bpm): 72 Weight(lbs): 130 Blood Pressure 104/48 (mmHg): Body Mass Index(BMI): 23 Temperature(F): 97.6 Respiratory Rate 18 (breaths/min): Photos: [2:No Photos] [N/A:N/A] Wound Location: [2:Left Calcaneus] [N/A:N/A] Wounding Event: [2:Pressure Injury] [N/A:N/A] Primary Etiology: [2:Pressure Ulcer] [N/A:N/A] Secondary Etiology: [2:Diabetic Wound/Ulcer of the Lower Extremity] [N/A:N/A] Comorbid History: [2:Cataracts, Type I Diabetes, Rheumatoid Arthritis, Osteoarthritis, Neuropathy, Received Chemotherapy] [N/A:N/A] Date Acquired: [2:07/15/2015] [N/A:N/A] Weeks of Treatment: [2:6] [N/A:N/A] Wound  Status: [2:Open] [N/A:N/A] Measurements L x W x D 0.4x0.5x0.6 [N/A:N/A] (cm) Area (cm) : [2:0.157] [N/A:N/A] Volume (cm) : [2:0.094] [N/A:N/A] % Reduction in Area: [2:33.50%] [N/A:N/A] % Reduction in Volume: 0.00% [N/A:N/A] Position 1 (o'clock): 7 Maximum Distance 1 0.6 (cm): Starting Position 1 [2:7] (o'clock): Ending Position 1 [2:12] (o'clock): Maximum Distance 1 0.3 (cm): Tunneling: [2:Yes] [N/A:N/A] Undermining: Yes N/A N/A Classification: Category/Stage III N/A N/A HBO Classification: Grade 1 N/A N/A Exudate Amount: Medium N/A N/A Exudate Type: Serosanguineous N/A N/A Exudate Color: red, brown N/A N/A Wound Margin: Flat and Intact N/A N/A Granulation Amount: Small (1-33%) N/A N/A Granulation Quality: Pink, Pale N/A N/A Necrotic Amount: None Present (0%) N/A N/A Exposed Structures: Fascia: No N/A N/A Fat: No Tendon: No Muscle: No Joint: No Bone: No Limited to Skin Breakdown Epithelialization: None N/A N/A Periwound Skin Texture: Edema: No N/A N/A Excoriation: No Induration: No Callus: No Crepitus: No Fluctuance: No Friable: No Rash: No Scarring: No Periwound Skin  Maceration: Yes N/A N/A Moisture: Moist: Yes Dry/Scaly: No Periwound Skin Color: Atrophie Blanche: No N/A N/A Cyanosis: No Ecchymosis: No Erythema: No Hemosiderin Staining: No Mottled: No Pallor: No Rubor: No Temperature: No Abnormality N/A N/A Tenderness on Yes N/A N/A Palpation: Wound Preparation: Ulcer Cleansing: N/A N/A Rinsed/Irrigated with Saline Topical Anesthetic Applied: Other: lidocaine 4% LASHEL, JENTZEN ([id number]) Treatment Notes Electronic Signature(s) Signed: 02/12/2016 5:52:12 PM By: Carole Civil Entered By: Carole Civil on 02/12/2016 12:09:42 XIOMARI, LIERA ([id number]) -------------------------------------------------------------------------------- [place] Details Patient Name: [name] Date of Service: 02/12/2016 10:45 AM Medical Record Number: [id number] Patient Account Number: [id number] Date of Birth/Sex: 07-09-1945 (70 y.o. Female) Treating RN: Carole Civil Primary Care Physician: Caryl Bis, ERIC Other Clinician: Referring Physician: Caryl Bis, ERIC Treating Physician/Extender: Loistine Chance in Treatment: 6 Active Inactive Orientation to the Wound Care Program Nursing Diagnoses: Knowledge deficit related to the wound healing center program Goals: Patient/caregiver will verbalize understanding of the [place] Program Date Initiated: 12/31/2015 Goal Status: Active Interventions: Provide education on orientation to the wound center Notes: Pressure Nursing Diagnoses: Knowledge deficit related to causes and risk factors for pressure ulcer development Knowledge deficit related to management of pressures ulcers Potential for impaired tissue integrity related to pressure, friction, moisture, and shear Goals: Patient will remain free from development of additional pressure ulcers Date Initiated: 12/31/2015 Goal Status: Active Patient will remain free of pressure ulcers Date Initiated:  12/31/2015 Goal Status: Active Patient/caregiver will verbalize risk factors for pressure ulcer development Date Initiated: 12/31/2015 Goal Status: Active Patient/caregiver will verbalize understanding of pressure ulcer management Date Initiated: 12/31/2015 Goal Status: Active Interventions: DONSHAE, MONICO ([id number]) Assess: immobility, friction, shearing, incontinence upon admission and as needed Assess offloading mechanisms upon admission and as needed Assess potential for pressure ulcer upon admission and as needed Provide education on pressure ulcers Treatment Activities: Patient referred for pressure reduction/relief devices : 12/31/2015 Notes: Wound/Skin Impairment Nursing Diagnoses: Impaired tissue integrity Knowledge deficit related to smoking impact on wound healing Knowledge deficit related to ulceration/compromised skin integrity Goals: Patient/caregiver will verbalize understanding of skin care regimen Date Initiated: 12/31/2015 Goal Status: Active Ulcer/skin breakdown will have a volume reduction of 30% by week 4 Date Initiated: 12/31/2015 Goal Status: Active Ulcer/skin breakdown will have a volume reduction of 50% by week 8 Date Initiated: 12/31/2015 Goal Status: Active Ulcer/skin breakdown will have a volume reduction of 80% by week 12 Date Initiated: 12/31/2015 Goal Status: Active Ulcer/skin breakdown will heal within 14 weeks Date Initiated: 12/31/2015 Goal Status:  Active Interventions: Assess patient/caregiver ability to obtain necessary supplies Assess patient/caregiver ability to perform ulcer/skin care regimen upon admission and as needed Assess ulceration(s) every visit Provide education on ulcer and skin care Treatment Activities: Skin care regimen initiated : 12/31/2015 Topical wound management initiated : 12/31/2015 Notes: HAIDE, ORMSBY ([id number]) Electronic Signature(s) Signed: 02/12/2016 5:52:12 PM By: Carole Civil Entered By: Carole Civil  on 02/12/2016 11:25:18 [name] ([id number]) -------------------------------------------------------------------------------- Pain Assessment Details Patient Name: [name] Date of Service: 02/12/2016 10:45 AM Medical Record Number: [id number] Patient Account Number: [id number] Date of Birth/Sex: [birth year] (70 y.o. Female) Treating RN: Carole Civil Primary Care Physician: Caryl Bis, ERIC Other Clinician: Referring Physician: Caryl Bis, ERIC Treating Physician/Extender: Loistine Chance in Treatment: 6 Active Problems Location of Pain Severity and Description of Pain Patient Has Paino No Site Locations Pain Management and Medication Current Pain Management: Electronic Signature(s) Signed: 02/12/2016 5:52:12 PM By: Carole Civil Entered By: Carole Civil on 02/12/2016 11:02:49 [name] ([id number]) -------------------------------------------------------------------------------- Wound Assessment Details Patient Name: [name] Date of Service: 02/12/2016 10:45 AM Medical Record Number: [id number] Patient Account Number: [id number] Date of Birth/Sex: 05-Jul-1945 (70 y.o. Female) Treating RN: Carole Civil Primary Care Physician: Caryl Bis, ERIC Other Clinician: Referring Physician: Caryl Bis, ERIC Treating Physician/Extender: Loistine Chance in Treatment: 6 Wound Status Wound Number: 2 Primary Pressure Ulcer Etiology: Wound Location: Left Calcaneus Secondary Diabetic Wound/Ulcer of the Lower Wounding Event: Pressure Injury Etiology: Extremity Date Acquired: 07/15/2015 Wound Open Weeks Of Treatment: 6 Status: Clustered Wound: No Comorbid Cataracts, Type I Diabetes, History: Rheumatoid Arthritis, Osteoarthritis, Neuropathy, Received Chemotherapy Wound Measurements Length: (cm) 0.4 Width: (cm) 0.5 Depth: (cm) 0.6 Area: (cm) 0.157 Volume: (cm) 0.094 % Reduction in Area: 33.5% % Reduction in Volume:  0% Epithelialization: None Tunneling: Yes Position (o'clock): 7 Maximum Distance: (cm) 0.6 Undermining: Yes Starting Position (o'clock): 7 Ending Position (o'clock): 12 Maximum Distance: (cm) 0.3 Wound Description Classification: Category/Stage III Foul Odor Diabetic Severity (Wagner): Grade 1 Wound Margin: Flat and Intact Exudate Amount: Medium Exudate Type: Serosanguineous Exudate Color: red, brown After Cleansing: No Wound Bed Granulation Amount: Small (1-33%) Exposed Structure Granulation Quality: Pink, Pale Fascia Exposed: No Necrotic Amount: None Present (0%) Fat Layer Exposed: No Tendon Exposed: No Muscle Exposed: No CERIA, GARING ([id number]) Joint Exposed: No Bone Exposed: No Limited to Skin Breakdown Periwound Skin Texture Texture Color No Abnormalities Noted: No No Abnormalities Noted: No Callus: No Atrophie Blanche: No Crepitus: No Cyanosis: No Excoriation: No Ecchymosis: No Fluctuance: No Erythema: No Friable: No Hemosiderin Staining: No Induration: No Mottled: No Localized Edema: No Pallor: No Rash: No Rubor: No Scarring: No Temperature / Pain Moisture Temperature: No Abnormality No Abnormalities Noted: No Tenderness on Palpation: Yes Dry / Scaly: No Maceration: Yes Moist: Yes Wound Preparation Ulcer Cleansing: Rinsed/Irrigated with Saline Topical Anesthetic Applied: Other: lidocaine 4%, Electronic Signature(s) Signed: 02/12/2016 5:52:12 PM By: Carole Civil Entered By: Carole Civil on 02/12/2016 11:24:03 [name] ([id number]) -------------------------------------------------------------------------------- Vitals Details Patient Name: [name] Date of Service: 02/12/2016 10:45 AM Medical Record Number: [id number] Patient Account Number: [id number] Date of Birth/Sex: [birth year] (70 y.o. Female) Treating RN: Carole Civil Primary Care Physician: Caryl Bis, ERIC Other Clinician: Referring Physician: Caryl Bis,  ERIC Treating Physician/Extender: Loistine Chance in Treatment: 6 Vital Signs Time Taken: 11:03 Temperature (F): 97.6 Height (in): 63 Pulse (bpm): 72 Weight (lbs): 130 Respiratory Rate (breaths/min): 18 Body Mass Index (BMI): 23 Blood Pressure (mmHg): 104/48 Reference Range: 80 - 120 mg / dl Electronic Signature(s) Signed: 02/12/2016  5:52:12 PM By: Carole Civil Entered By: Carole Civil on 02/12/2016 11:09:52

## 2016-02-15 ENCOUNTER — Ambulatory Visit
Admission: RE | Admit: 2016-02-15 | Discharge: 2016-02-15 | Disposition: A | Discharge status: Home / Self Care | Payer: Medicare Other | Source: Ambulatory Visit | Attending: Hematology and Oncology | Admitting: Hematology and Oncology

## 2016-02-15 DIAGNOSIS — C801 Malignant (primary) neoplasm, unspecified: Secondary | ICD-10-CM | POA: Diagnosis not present

## 2016-02-15 DIAGNOSIS — Z933 Colostomy status: Secondary | ICD-10-CM | POA: Insufficient documentation

## 2016-02-15 DIAGNOSIS — N1330 Unspecified hydronephrosis: Secondary | ICD-10-CM | POA: Diagnosis not present

## 2016-02-15 DIAGNOSIS — R911 Solitary pulmonary nodule: Secondary | ICD-10-CM | POA: Diagnosis not present

## 2016-02-15 DIAGNOSIS — R590 Localized enlarged lymph nodes: Secondary | ICD-10-CM | POA: Insufficient documentation

## 2016-02-15 DIAGNOSIS — Z9049 Acquired absence of other specified parts of digestive tract: Secondary | ICD-10-CM | POA: Insufficient documentation

## 2016-02-15 DIAGNOSIS — C569 Malignant neoplasm of unspecified ovary: Secondary | ICD-10-CM

## 2016-02-15 DIAGNOSIS — J90 Pleural effusion, not elsewhere classified: Secondary | ICD-10-CM | POA: Insufficient documentation

## 2016-02-15 DIAGNOSIS — K769 Liver disease, unspecified: Secondary | ICD-10-CM | POA: Diagnosis not present

## 2016-02-15 DIAGNOSIS — N3289 Other specified disorders of bladder: Secondary | ICD-10-CM | POA: Diagnosis not present

## 2016-02-15 DIAGNOSIS — K838 Other specified diseases of biliary tract: Secondary | ICD-10-CM | POA: Diagnosis not present

## 2016-02-15 DIAGNOSIS — C786 Secondary malignant neoplasm of retroperitoneum and peritoneum: Secondary | ICD-10-CM

## 2016-02-15 MED ORDER — IOPAMIDOL (ISOVUE-300) INJECTION 61%
100.0000 mL | Freq: Once | INTRAVENOUS | Status: AC | PRN
  Administered 2016-02-15: 100 mL via INTRAVENOUS

## 2016-02-21 ENCOUNTER — Other Ambulatory Visit: Payer: Self-pay | Admitting: Hematology and Oncology

## 2016-02-21 NOTE — Progress Notes (Signed)
[facility] day:  02/22/16   Chief Complaint: [name] is a 70 y.o. female with stage IIIC ovarian cancer who is seen for review of interval CT scans and assessment prior to cycle #1 carboplatin and gemcitabine.  HPI:  The patient was last seen in the medical oncology clinic on 01/22/2016.  At that time, she was doing well.  Weight was up 4 pounds.  She had a persistent grade III neuropathy.  WBC count had improved with treatment of her rheumatoid arthritis (ANC increased from 400 to 1300).  Chest, abdomen, and pelvic CT scan on 02/15/2016 revealed multiple soft tissue nodules throughout the pelvis, small bowel mesenteric and peritoneum and, concerning for peritoneal metastatic disease.  There was soft tissue irregularity within the left upper quadrant which may represent postprocedural changes or metastatic disease.  There was mild right hydronephrosis (etiology unclear).  There was interval resolution of previously described right pleural based fluid and gas collection. There was a pleural based nodule within the right lower hemi-thorax concerning for the possibility of pleural based metastasis.  There were small bilateral pleural effusions.  There was pulmonary nodularity, predominately within the left upper lobe (metastatic or infectious/inflammatory etiology).  There was slightly increased mediastinal adenopathy (infectious/inflammatory or metastatic).  There was the interval development of a low attenuation lesion within the left hepatic lobe (complicated fluid within the fissure or metastatic disease).  She saw Dr. Jefm Bryant on 02/17/2016.  She was noted to not really responsive to rheumatoid regimen.  It was noted that if she went onto chemotherapy, that she may come off her prednisone and methotrexate.  Symptomatically, she has felt fine. She is "up and going". She notes that she is "not back to the way I was". She feels that she is doing quite  well. She is gaining weight.   Past Medical History:  Diagnosis Date  . CAD (coronary artery disease)    a. 09/2012 Cath: LM nl, LAD 95p, 55m LCX 934mOM2 50, RCA 100.  . Marland Kitchenholelithiasis   . Chronic diastolic CHF (congestive heart failure) (HCD[facility]  . Collagen vascular disease (HCFlat[facility]  . Esophageal stricture   . Exertional shortness of breath   . GERD (gastroesophageal reflux disease)   . H/O hiatal hernia   . Herniated disc   . History of pancytopenia   . Hyperlipidemia   . Hypertension   . Hypokalemia   . Leukopenia 2012   s/p bone marrow biopsy, Dr. PaMa Hillock. NSTEMI (non-ST elevated myocardial infarction) ([facility]CF[facility]5/2014   "mild" (10/18/2012)  . Ovarian cancer (HCP[place]2016   chemo  . Pneumonia 2013; 08/2012   "one lung; double" (10/18/2012)  . Rheumatoid arthritis(714.0)   . Type I diabetes mellitus (HCP[facility]   "dx'd in 1957" (10/18/2012)    Past Surgical History:  Procedure Laterality Date  . ABDOMINAL HYSTERECTOMY  06/15/2015   Dx L/S, EXLAP TAH BSO omentectomy RSRx colostomy diaphragm resection stripping  . CARDIAC CATHETERIZATION  10/18/2012   "first one was today" (10/18/2012)  . CATARACT EXTRACTION W/ INTRAOCULAR LENS IMPLANT Right 2010  . CHOLECYSTECTOMY  06/15/2015   combined case with ovarian cancer debulking  . COLON SURGERY    . CORONARY ARTERY BYPASS GRAFT N/A 10/19/2012   Procedure: CORONARY ARTERY BYPASS GRAFTING (CABG);  Surgeon: StMelrose NakayamaMD;  [facility] Service: Open [facility] Surgery;  Laterality: N/A;  . ESOPHAGEAL DILATION     "3 or 4 times" (  10/19/2011)  . ESOPHAGOGASTRODUODENOSCOPY  2012   Dr. Minna Merritts  . OSTOMY    . OVARY SURGERY     removal  . PERIPHERAL VASCULAR CATHETERIZATION N/A 03/02/2015   Procedure: Glori Luis Cath Insertion;  Surgeon: Algernon Huxley, MD;  Location: [facility];  Service: Cardiovascular;  Laterality: N/A;  . TUBAL LIGATION  1970    Family History  Problem Relation Age of Onset  . Diabetes Mother   . Arthritis  Mother   . Diabetes Father   . Arthritis Father   . Bone cancer Sister     Social History:  reports that she quit smoking about 21 years ago. Her smoking use included Cigarettes. She has a 30.00 pack-year smoking history. She has never used smokeless tobacco. She reports that she does not drink alcohol or use drugs.  The patient is accompanied by her husband today.  Allergies:  Allergies  Allergen Reactions  . Codeine Nausea And Vomiting  . Latex Rash    Current Medications: Current Outpatient Prescriptions  Medication Sig Dispense Refill  . Calcium-Vitamin D 600-200 MG-UNIT tablet Take 2 tablets by mouth daily.     Marland Kitchen enoxaparin (LOVENOX) 60 MG/0.6ML injection Inject 60 mg into the skin. Held last pm and this am dose for bone marrow biopsy    . folic acid (FOLVITE) 1 MG tablet TAKE 1 TABLET BY MOUTH ONCE A DAY 90 tablet 3  . furosemide (LASIX) 80 MG tablet Take 80 mg by mouth daily as needed.     . Insulin Human (INSULIN PUMP) SOLN Pt uses Humalog insulin.    Marland Kitchen insulin lispro (HUMALOG) 100 UNIT/ML injection Inject 0.06 mLs (6 Units total) into the skin daily. 30 mL 3  . lidocaine-prilocaine (EMLA) cream Apply 1 application topically as needed (prior to accessing port).    . loratadine (CLARITIN) 10 MG tablet Take 1 tablet by mouth daily as needed.    Marland Kitchen losartan (COZAAR) 100 MG tablet Take 1 tablet (100 mg total) by mouth daily. 90 tablet 1  . methotrexate (RHEUMATREX) 2.5 MG tablet Take by mouth.    . metoprolol tartrate (LOPRESSOR) 25 MG tablet TAKE 1 TABLET BY MOUTH TWICE A DAY AS NEEDED 180 tablet 3  . ondansetron (ZOFRAN) 8 MG tablet Take 1 tablet (8 mg total) by mouth every 8 (eight) hours as needed for nausea or vomiting. 20 tablet 1  . ONE TOUCH ULTRA TEST test strip     . oxyCODONE (OXY IR/ROXICODONE) 5 MG immediate release tablet Take 5 mg by mouth every 6 (six) hours as needed. Reported on 11/17/2015    . pantoprazole (PROTONIX) 40 MG tablet TAKE 1 TABLET BY MOUTH TWICE A DAY  180 tablet 1  . potassium chloride SA (K-DUR,KLOR-CON) 20 MEQ tablet Take 2 tablets (40 mEq total) by mouth daily. 60 tablet 3  . predniSONE (DELTASONE) 5 MG tablet Take 5 mg by mouth as needed. Reported on 09/17/2015    . ranitidine (ZANTAC) 150 MG tablet Take 1 tablet (150 mg total) by mouth 2 (two) times daily. (Patient taking differently: Take 150 mg by mouth 2 (two) times daily as needed. ) 180 tablet 2  . vitamin C (ASCORBIC ACID) 500 MG tablet Take 500 mg by mouth daily.    Marland Kitchen zinc gluconate 50 MG tablet Take 50 mg by mouth daily.     No current facility-administered medications for this visit.    Facility-Administered Medications Ordered in Other Visits  Medication Dose Route Frequency Provider Last Rate  Last Dose  . heparin lock flush 100 unit/mL  500 Units Intravenous Once Lequita Asal, MD      . sodium chloride 0.9 % injection 10 mL  10 mL Intracatheter PRN Lequita Asal, MD      . sodium chloride 0.9 % injection 10 mL  10 mL Intravenous PRN Lequita Asal, MD   10 mL at 04/13/15 3646    Review of Systems:  GENERAL:  Feels good.  No fever, chills or sweats.  Weight up 7 pounds. PERFORMANCE STATUS (ECOG):  1 HEENT:  No visual changes, sore throat, mouth sores or tenderness. Lungs:  No shortness of breath or cough.  No hemoptysis. Cardiac:  No chest pain, palpitations, orthopnea, or PND. GI:  Eating well.  No abdominal pain.  No constipation, diarrhea, melena or hematochezia. GU:  No urgency, frequency, dysuria, or hematuria. Musculoskeletal:  Severe rheumatoid arthritis, improving.  Osteoporosis.  No back pain. No muscle tenderness. Extremities:  No pain or swelling. Skin:  No rashes or skin changes. Neuro:  Neuropathy (stable).  No headache, numbness or weakness, balance or coordination issues. Endocrine:  Diabetes on an insulin pump.  No thyroid issues, hot flashes or night sweats. Psych:  No mood changes, depression or anxiety. Pain: No pain. Review of  systems:  All other systems reviewed and found to be negative.  Physical Exam: Blood pressure 123/70, pulse 74, temperature (!) 96.6 F (35.9 C), temperature source Tympanic, resp. rate 18, weight 139 lb 15.9 oz (63.5 kg). GENERAL:  Well developed, well nourished woman sitting comfortably in the exam room in no acute distress. MENTAL STATUS:  Alert and oriented to person, place and time. HEAD:  Curly gray hair.  Normocephalic, atraumatic, face symmetric, no Cushingoid features. EYES:  Gold rimmed glasses.  Blue eyes.  No conjunctivitis or scleral icterus.  ENT: Oropharynx clear without lesion. Tongue normal. Mucous membranes moist.  RESPIRATORY: Clear to auscultation without rales, wheezes or rhonchi. CARDIOVASCULAR: Regular rate and rhythm without murmur, rub or gallop. ABDOMEN: Soft, non-tender with active bowel sounds and no appreciable hepatosplenomegaly. No palpable nodularity or masses. SKIN: No rashes, ulcers, or bruises. EXTREMITIES:  No edema, skin discoloration or tenderness. No palpable cords. LYMPH NODES: No palpable cervical, supraclavicular, axillary or inguinal adenopathy  NEUROLOGICAL: Appropriate. PSYCH: Appropriate.   Infusion on 02/22/2016  Component Date Value Ref Range Status  . WBC 02/22/2016 2.9* 3.6 - 11.0 K/uL Final  . RBC 02/22/2016 3.82  3.80 - 5.20 MIL/uL Final  . Hemoglobin 02/22/2016 11.3* 12.0 - 16.0 g/dL Final  . HCT 02/22/2016 32.5* 35.0 - 47.0 % Final  . MCV 02/22/2016 85.1  80.0 - 100.0 fL Final  . MCH 02/22/2016 29.6  26.0 - 34.0 pg Final  . MCHC 02/22/2016 34.8  32.0 - 36.0 g/dL Final  . RDW 02/22/2016 15.1* 11.5 - 14.5 % Final  . Platelets 02/22/2016 211  150 - 440 K/uL Final  . Neutrophils Relative % 02/22/2016 54  % Final  . Neutro Abs 02/22/2016 1.6  1.4 - 6.5 K/uL Final  . Lymphocytes Relative 02/22/2016 25  % Final  . Lymphs Abs 02/22/2016 0.7* 1.0 - 3.6 K/uL Final  . Monocytes Relative 02/22/2016 17  % Final  . Monocytes  Absolute 02/22/2016 0.5  0.2 - 0.9 K/uL Final  . Eosinophils Relative 02/22/2016 3  % Final  . Eosinophils Absolute 02/22/2016 0.1  0 - 0.7 K/uL Final  . Basophils Relative 02/22/2016 1  % Final  . Basophils Absolute 02/22/2016  0.0  0 - 0.1 K/uL Final  . Sodium 02/22/2016 134* 135 - 145 mmol/L Final  . Potassium 02/22/2016 4.2  3.5 - 5.1 mmol/L Final  . Chloride 02/22/2016 100* 101 - 111 mmol/L Final  . CO2 02/22/2016 26  22 - 32 mmol/L Final  . Glucose, Bld 02/22/2016 168* 65 - 99 mg/dL Final  . BUN 02/22/2016 19  6 - 20 mg/dL Final  . Creatinine, Ser 02/22/2016 0.69  0.44 - 1.00 mg/dL Final  . Calcium 02/22/2016 8.8* 8.9 - 10.3 mg/dL Final  . Total Protein 02/22/2016 6.6  6.5 - 8.1 g/dL Final  . Albumin 02/22/2016 3.1* 3.5 - 5.0 g/dL Final  . AST 02/22/2016 16  15 - 41 U/L Final  . ALT 02/22/2016 14  14 - 54 U/L Final  . Alkaline Phosphatase 02/22/2016 63  38 - 126 U/L Final  . Total Bilirubin 02/22/2016 0.5  0.3 - 1.2 mg/dL Final  . GFR calc non Af Amer 02/22/2016 >60  >60 mL/min Final  . GFR calc Af Amer 02/22/2016 >60  >60 mL/min Final   Comment: (NOTE) The eGFR has been calculated using the CKD EPI equation. This calculation has not been validated in all clinical situations. eGFR's persistently <60 mL/min signify possible Chronic Kidney Disease.   . Anion gap 02/22/2016 8  5 - 15 Final  . Magnesium 02/22/2016 1.7  1.7 - 2.4 mg/dL Final    Assessment:  [name] is a 70 y.o. female with clinical stage IIIC (T3cN1Mx) ovarian cancer presenting with abdominal discomfort and bloating.  Omental biopsy on 02/23/2015 revealed metastatic high grade serous carcinoma, consistent with gynecologic origin.  CA125 was 707 on 02/17/2015.  Abdomen and pelvic CT scan on 02/13/2015 revealed bilateral mass-like adnexal regions (right adnexal mass 5.6 x 5.0 cm and the left adnexa mass 10.3 x 6.0 x 9.9 cm).  There was a large amount of soft tissue throughout the peritoneal cavity involving the  omentum and other peritoneal surfaces.  There was a small volume ascites. There was a peripheral 3.3 x 1.9 cm low-attenuation lesion overlying the right lobe of the liver , likely representing a serosal implant. There was 1.4 x 2.2 cm ill-defined peripheral lesion within the inferior aspect of segment 6 adjacent to the ampulla in the duodenum.   She received 4 cycles of neoadjuvant carboplatin and Taxol (03/05/2015 - 05/22/2015).  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  She had loose stools on oral magnesium.  She was initially on Neurontin then switched Lyrica with cycle #3.  Cycle #4 was notable for neutropenia (ANC 300) requiring GCSF x 3 days.    CA125 was 802.9 on 03/30/2015, 567.9 on 04/13/2015, 168.8 on 05/15/2015, 85.2 on 07/17/2015, 68.6 on 07/28/2015, 34.5 on 09/17/2015, 20.7 on 11/06/2015, 49 on 01/22/2016, and 106.1 on 02/22/2016.  Abdominal and pelvic CT scan on 04/28/2015 revealed decreasing bilateral ovarian masses.   The left adnexal mass measured 4.0 x 6.1 cm (previously 5.0 x 8.5 cm). The right ovary measured 3.8 x 4.9 cm (previously 4.7 x 5.6 cm).  There was improved peritoneal carcinomatosis.  There was a small amount of ascites.  The hepatic dome lesion was stable. The previously seen right hepatic lobe lesion was not well-visualized.  There was a small left pleural effusion and trace right pleural effusion.  The nodular lesion at the ampulla of Vater, extending into the duodenum was stable.  There was no biliary ductal dilatation.  She underwent exploratory laparotomy, lysis of adhesions,  total abdominal hysterectomy with bilateral salpingo-oophorectomy, infracolic omentectomy, optimal tumor debulking(< 1 cm), recto-sigmoid resection with creation of end colostomy, cholecystectomy, mobilization of splenic flexure and liver with diaphragmatic stripping on 06/15/2015. The right diaphragm was cleared of tumor. During dissection, the diaphragm was entered and closed with sutures.    She has had a recurrent right sided pleural effusion.  She underwent thoracentesis of 650 cc on post-operative day 3.  She was admitted to [facility] on 07/01/2015 and 07/07/2015 for recurrent shortness of breath.  She has undergone 2 additional thoracentesis (1.1 L on 07/02/2015 and 850 cc on 07/08/2015).  Cytology was negative x 2.  Bilateral lower extremity duplex on 07/03/2015 was negative.  Echo on 07/08/2015 revealed en EF of 55-60%.    She has severe rheumatoid arthritis.  Methotrexate and Enbrel were initially on hold.  She has a normocytic anemia.  Work-up on 02/17/2015 revealed a normal ferritin, B12, folate, TSH.  She denies any melena or hematochezia.    She has anemia likely due to chronic disease. She received 1 unit PRBCs during her admission at [facility]. She denies any melena or hematochezia. She has diabetes and is on an insulin pump.  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 She is on Lovenox 60 mg twice a day.   She was admitted to [facility] from 07/29/2015 - 08/10/2015 with a right-sided empyema and liver abscess. She underwent CT-guided placement of a liver abscess drain on 07/30/2015. Liver abscess culture grew out group B strep and Enterobacter which was sensitive to Zosyn. She was transitioned to ertapenem Colbert Ewing) prior to discharge.  She was readmitted to [facility] on 09/17/2015.  Chest, abdomen, and pelvic CT scan on 09/01/2015 revealed continued decrease in perihepatic fluid collection contiguous with the right pleural space with percutaneous drain. Drain was removed on 09/01/2015.  Chest, abdomen, and pelvic CT scan on 09/11/2015 revealed a residual versus recurrent fluid collection posterior to the right hepatic lobe (2.6 x 1.1 x 4.0 cm).  Chest, abdomen, and pelvic CT scan on 10/13/2015 revealed resolution of the empyema.    She has had  persistent neutropenia felt secondary to her rheumatoid arthritis.  Folate and MMA were normal.  TSH was 6.13 (high) with a free T4 of 1.17 (0.61-1.12).  She began methotrexate (10 mg a week) and prednisone (5 mg a day) for severe rheumatoid arthritis on 12/17/2015.  Counts are improving.  Bone marrow aspirate and biopsy on 06/09/2010 revealed a hypercellular marrow (70%) with no evidence of dysplasia or malignancy.  Flow cytometry was negative.  Cytogenetics were normal (46,XX).  FISH studies were negative for MDS.  Bone marrow aspirate and biopsy on 12/03/2015 revealed a normocellular to mildy hypercellular marrow for age (40%) with left shifted myelopoiesis, non specific dyserythropoiesis and mild megakaryocytic atypia with no increase in blasts.  There were multiple small nonspecific lymphoid aggregates (favor reactive). There was no increase in reticulin.  There was decreased myeloid cells (37%) with left shifted maturation and 1% atypical myelod blasts.  There was relatively increased monocytic cells (11%), relatively increased lymphoid cells (36%), and relatively increased eosinophils (6%).  Cytogenetics were normal (7, XX).  SNP microarray was normal.  Alkaline phosphatase was 348 (38-126) on 11/06/2015 and 431 on 11/17/2015.   Fractionated alkaline phosphatase on 11/09/2015 revealed 21% bone and 79% liver.  MRI of the abdomen on 12/14/2015 revealed mild extrahepatic biliary  duct dilatation without obstructing lesion identified.  Chest, abdomen, and pelvic CT scan on 02/15/2016 revealed multiple soft tissue nodules throughout the pelvis, small bowel mesenteric and peritoneum and, concerning for peritoneal metastatic disease.  There was soft tissue irregularity within the left upper quadrant.  There was mild right hydronephrosis (etiology unclear).  There was interval resolution of previously described right pleural based fluid and gas collection. There was a pleural based nodule within the right  lower hemi-thorax concerning for pleural based metastasis.  There were small bilateral pleural effusions.  There was pulmonary nodularity, predominately within the left upper lobe (metastatic or infectious/inflammatory etiology).  There was slightly increased mediastinal adenopathy (infectious/inflammatory or metastatic).  There was a low attenuation lesion within the left hepatic lobe (complicated fluid within the fissure or metastatic disease).  Symptomatically, she feels good.  She has a persistent grade III neuropathy.  WBC count is improving with treatment of her rheumatoid arthritis (ANC 400 to 1500).  Plan: 1.  Labs today:  CBC with diff, CMP, Mg, CA125. 2.  Review interval CT scans.  Concern for progressive metastatic disease. 3.  Discuss postponing chemotherapy as patient asymptomatic (discussed with Dr Theora Gianotti). 4.  Phone follow-up with Dr. Theora Gianotti secondary to current scans.  Consider hormonal therapy. 5.  Port flush today and every 6 weeks. 6.  Continue Lovenox.  Consider switch to Xarelto or Eliquis. 7.  RTC in 4 weeks for MD assessment, labs (CBC with diff, CMP, CA125), and  +/- carboplatin/gemcitabine  Addendum:  Tamoxifen will be initiated secondary to scans and rising CA125.   Lequita Asal, MD  02/22/2016, 10:20 AM

## 2016-02-22 ENCOUNTER — Inpatient Hospital Stay (HOSPITAL_BASED_OUTPATIENT_CLINIC_OR_DEPARTMENT_OTHER): Payer: Medicare Other | Admitting: Hematology and Oncology

## 2016-02-22 ENCOUNTER — Inpatient Hospital Stay: Payer: Medicare Other | Attending: Hematology and Oncology

## 2016-02-22 ENCOUNTER — Inpatient Hospital Stay: Payer: Medicare Other

## 2016-02-22 VITALS — BP 123/70 | HR 74 | Temp 96.6°F | Resp 18 | Wt 140.0 lb

## 2016-02-22 DIAGNOSIS — Z7952 Long term (current) use of systemic steroids: Secondary | ICD-10-CM

## 2016-02-22 DIAGNOSIS — R918 Other nonspecific abnormal finding of lung field: Secondary | ICD-10-CM | POA: Diagnosis not present

## 2016-02-22 DIAGNOSIS — E119 Type 2 diabetes mellitus without complications: Secondary | ICD-10-CM

## 2016-02-22 DIAGNOSIS — C786 Secondary malignant neoplasm of retroperitoneum and peritoneum: Secondary | ICD-10-CM

## 2016-02-22 DIAGNOSIS — C561 Malignant neoplasm of right ovary: Secondary | ICD-10-CM | POA: Insufficient documentation

## 2016-02-22 DIAGNOSIS — K769 Liver disease, unspecified: Secondary | ICD-10-CM

## 2016-02-22 DIAGNOSIS — Z7901 Long term (current) use of anticoagulants: Secondary | ICD-10-CM

## 2016-02-22 DIAGNOSIS — K319 Disease of stomach and duodenum, unspecified: Secondary | ICD-10-CM | POA: Insufficient documentation

## 2016-02-22 DIAGNOSIS — E785 Hyperlipidemia, unspecified: Secondary | ICD-10-CM | POA: Insufficient documentation

## 2016-02-22 DIAGNOSIS — Z8701 Personal history of pneumonia (recurrent): Secondary | ICD-10-CM | POA: Insufficient documentation

## 2016-02-22 DIAGNOSIS — I5032 Chronic diastolic (congestive) heart failure: Secondary | ICD-10-CM

## 2016-02-22 DIAGNOSIS — D649 Anemia, unspecified: Secondary | ICD-10-CM | POA: Insufficient documentation

## 2016-02-22 DIAGNOSIS — Z923 Personal history of irradiation: Secondary | ICD-10-CM | POA: Insufficient documentation

## 2016-02-22 DIAGNOSIS — D72819 Decreased white blood cell count, unspecified: Secondary | ICD-10-CM

## 2016-02-22 DIAGNOSIS — Z794 Long term (current) use of insulin: Secondary | ICD-10-CM

## 2016-02-22 DIAGNOSIS — E876 Hypokalemia: Secondary | ICD-10-CM | POA: Insufficient documentation

## 2016-02-22 DIAGNOSIS — Z79899 Other long term (current) drug therapy: Secondary | ICD-10-CM | POA: Insufficient documentation

## 2016-02-22 DIAGNOSIS — Z9641 Presence of insulin pump (external) (internal): Secondary | ICD-10-CM | POA: Insufficient documentation

## 2016-02-22 DIAGNOSIS — C569 Malignant neoplasm of unspecified ovary: Secondary | ICD-10-CM

## 2016-02-22 DIAGNOSIS — G629 Polyneuropathy, unspecified: Secondary | ICD-10-CM | POA: Insufficient documentation

## 2016-02-22 DIAGNOSIS — I10 Essential (primary) hypertension: Secondary | ICD-10-CM

## 2016-02-22 DIAGNOSIS — N1330 Unspecified hydronephrosis: Secondary | ICD-10-CM

## 2016-02-22 DIAGNOSIS — M069 Rheumatoid arthritis, unspecified: Secondary | ICD-10-CM

## 2016-02-22 DIAGNOSIS — Z9221 Personal history of antineoplastic chemotherapy: Secondary | ICD-10-CM | POA: Insufficient documentation

## 2016-02-22 DIAGNOSIS — Z87891 Personal history of nicotine dependence: Secondary | ICD-10-CM

## 2016-02-22 DIAGNOSIS — I252 Old myocardial infarction: Secondary | ICD-10-CM

## 2016-02-22 DIAGNOSIS — I2510 Atherosclerotic heart disease of native coronary artery without angina pectoris: Secondary | ICD-10-CM | POA: Diagnosis not present

## 2016-02-22 DIAGNOSIS — K222 Esophageal obstruction: Secondary | ICD-10-CM

## 2016-02-22 DIAGNOSIS — C562 Malignant neoplasm of left ovary: Secondary | ICD-10-CM | POA: Insufficient documentation

## 2016-02-22 DIAGNOSIS — I999 Unspecified disorder of circulatory system: Secondary | ICD-10-CM

## 2016-02-22 DIAGNOSIS — Z90710 Acquired absence of both cervix and uterus: Secondary | ICD-10-CM

## 2016-02-22 DIAGNOSIS — J90 Pleural effusion, not elsewhere classified: Secondary | ICD-10-CM | POA: Diagnosis not present

## 2016-02-22 DIAGNOSIS — C801 Malignant (primary) neoplasm, unspecified: Secondary | ICD-10-CM

## 2016-02-22 DIAGNOSIS — K8020 Calculus of gallbladder without cholecystitis without obstruction: Secondary | ICD-10-CM | POA: Diagnosis not present

## 2016-02-22 DIAGNOSIS — K219 Gastro-esophageal reflux disease without esophagitis: Secondary | ICD-10-CM

## 2016-02-22 DIAGNOSIS — R188 Other ascites: Secondary | ICD-10-CM

## 2016-02-22 DIAGNOSIS — R599 Enlarged lymph nodes, unspecified: Secondary | ICD-10-CM

## 2016-02-22 DIAGNOSIS — Z862 Personal history of diseases of the blood and blood-forming organs and certain disorders involving the immune mechanism: Secondary | ICD-10-CM

## 2016-02-22 DIAGNOSIS — Z90722 Acquired absence of ovaries, bilateral: Secondary | ICD-10-CM | POA: Insufficient documentation

## 2016-02-22 DIAGNOSIS — I82C11 Acute embolism and thrombosis of right internal jugular vein: Secondary | ICD-10-CM

## 2016-02-22 DIAGNOSIS — Z86718 Personal history of other venous thrombosis and embolism: Secondary | ICD-10-CM

## 2016-02-22 LAB — CBC WITH DIFFERENTIAL/PLATELET
Basophils Absolute: 0 10*3/uL (ref 0–0.1)
Basophils Relative: 1 %
Eosinophils Absolute: 0.1 10*3/uL (ref 0–0.7)
Eosinophils Relative: 3 %
HCT: 32.5 % — ABNORMAL LOW (ref 35.0–47.0)
Hemoglobin: 11.3 g/dL — ABNORMAL LOW (ref 12.0–16.0)
Lymphocytes Relative: 25 %
Lymphs Abs: 0.7 10*3/uL — ABNORMAL LOW (ref 1.0–3.6)
MCH: 29.6 pg (ref 26.0–34.0)
MCHC: 34.8 g/dL (ref 32.0–36.0)
MCV: 85.1 fL (ref 80.0–100.0)
Monocytes Absolute: 0.5 10*3/uL (ref 0.2–0.9)
Monocytes Relative: 17 %
Neutro Abs: 1.6 10*3/uL (ref 1.4–6.5)
Neutrophils Relative %: 54 %
Platelets: 211 10*3/uL (ref 150–440)
RBC: 3.82 MIL/uL (ref 3.80–5.20)
RDW: 15.1 % — ABNORMAL HIGH (ref 11.5–14.5)
WBC: 2.9 10*3/uL — ABNORMAL LOW (ref 3.6–11.0)

## 2016-02-22 LAB — COMPREHENSIVE METABOLIC PANEL
ALT: 14 U/L (ref 14–54)
AST: 16 U/L (ref 15–41)
Albumin: 3.1 g/dL — ABNORMAL LOW (ref 3.5–5.0)
Alkaline Phosphatase: 63 U/L (ref 38–126)
Anion gap: 8 (ref 5–15)
BUN: 19 mg/dL (ref 6–20)
CO2: 26 mmol/L (ref 22–32)
Calcium: 8.8 mg/dL — ABNORMAL LOW (ref 8.9–10.3)
Chloride: 100 mmol/L — ABNORMAL LOW (ref 101–111)
Creatinine, Ser: 0.69 mg/dL (ref 0.44–1.00)
GFR calc Af Amer: >60 mL/min (ref >60)
GFR calc non Af Amer: >60 mL/min (ref >60)
Glucose, Bld: 168 mg/dL — ABNORMAL HIGH (ref 65–99)
Potassium: 4.2 mmol/L (ref 3.5–5.1)
Sodium: 134 mmol/L — ABNORMAL LOW (ref 135–145)
Total Bilirubin: 0.5 mg/dL (ref 0.3–1.2)
Total Protein: 6.6 g/dL (ref 6.5–8.1)

## 2016-02-22 LAB — MAGNESIUM: Magnesium: 1.7 mg/dL (ref 1.7–2.4)

## 2016-02-22 MED ORDER — HEPARIN SOD (PORK) LOCK FLUSH 100 UNIT/ML IV SOLN
500.0000 [IU] | Freq: Once | INTRAVENOUS | Status: AC
  Administered 2016-02-22: 500 [IU] via INTRAVENOUS
  Filled 2016-02-22: qty 5

## 2016-02-22 MED ORDER — SODIUM CHLORIDE 0.9 % IJ SOLN
10.0000 mL | Freq: Once | INTRAMUSCULAR | Status: AC
  Administered 2016-02-22: 10 mL via INTRAVENOUS
  Filled 2016-02-22: qty 10

## 2016-02-22 MED ORDER — ENOXAPARIN SODIUM 60 MG/0.6ML ~~LOC~~ SOLN: 60.0000 mg | Freq: Two times a day (BID) | SUBCUTANEOUS | 0 refills | Status: DC

## 2016-02-22 NOTE — Progress Notes (Signed)
Patient offers no complaints today. 

## 2016-02-23 LAB — CA 125: CA 125: 106.1 U/mL — ABNORMAL HIGH (ref 0.0–38.1)

## 2016-02-23 LAB — VITAMIN D 25 HYDROXY (VIT D DEFICIENCY, FRACTURES): Vit D, 25-Hydroxy: 40.1 ng/mL (ref 30.0–100.0)

## 2016-02-24 ENCOUNTER — Other Ambulatory Visit: Payer: Self-pay

## 2016-02-24 DIAGNOSIS — E785 Hyperlipidemia, unspecified: Secondary | ICD-10-CM

## 2016-02-25 MED ORDER — TAMOXIFEN CITRATE 20 MG PO TABS: 20.0000 mg | ORAL_TABLET | Freq: Every day | ORAL | 1 refills | Status: DC

## 2016-02-26 ENCOUNTER — Encounter: Payer: Medicare Other | Admitting: Surgery

## 2016-02-26 DIAGNOSIS — E10621 Type 1 diabetes mellitus with foot ulcer: Secondary | ICD-10-CM | POA: Diagnosis not present

## 2016-02-27 NOTE — Progress Notes (Addendum)
[name], [name] ([id number]) Visit Report for 02/26/2016 Arrival Information Details Patient Name: [name], [name] Date of Service: 02/26/2016 10:45 AM Medical Record Number: [id number] Patient Account Number: [id number] Date of Birth/Sex: 11/02/1945 (70 y.o. Female) Treating RN: Afful, RN, BSN, Velva Harman Primary Care Physician: Caryl Bis, ERIC Other Clinician: Referring Physician: Caryl Bis, ERIC Treating Physician/Extender: Frann Rider in Treatment: 8 Visit Information History Since Last Visit All ordered tests and consults were completed: No Patient Arrived: Kasandra Knudsen Added or deleted any medications: No Arrival Time: 10:43 Any new allergies or adverse reactions: No Accompanied By: hubby Had a fall or experienced change in No Transfer Assistance: None activities of daily living that may affect Patient Identification Verified: Yes risk of falls: Secondary Verification Process Yes Signs or symptoms of abuse/neglect since last No Completed: visito Patient Requires Transmission- No Hospitalized since last visit: No Based Precautions: Has Dressing in Place as Prescribed: Yes Patient Has Alerts: Yes Pain Present Now: Yes Patient Alerts: Patient on Blood Thinner lovenox BID DMII Electronic Signature(s) Signed: 02/26/2016 5:11:59 PM By: Regan Lemming BSN, RN Previous Signature: 02/26/2016 10:44:12 AM Version By: Regan Lemming BSN, RN Entered By: Regan Lemming on 02/26/2016 10:45:45 [name] ([id number]) -------------------------------------------------------------------------------- Encounter Discharge Information Details Patient Name: [name] Date of Service: 02/26/2016 10:45 AM Medical Record Number: [id number] Patient Account Number: [id number] Date of Birth/Sex: 1946/02/14 (70 y.o. Female) Treating RN: Baruch Gouty, RN, BSN, Velva Harman Primary Care Physician: Caryl Bis, ERIC Other Clinician: Referring Physician: Caryl Bis, ERIC Treating Physician/Extender: Frann Rider in Treatment: 8 Encounter Discharge Information Items Discharge Pain Level: 0 Discharge Condition: Stable Ambulatory Status: Cane Discharge Destination: Home Transportation: Private Auto Accompanied By: Micheline Rough Schedule Follow-up Appointment: No Medication Reconciliation completed and provided to Patient/Care No [name]: Provided on Clinical Summary of Care: 02/26/2016 Form Type Recipient Paper Patient SR Electronic Signature(s) Signed: 02/26/2016 4:41:23 PM By: Regan Lemming BSN, RN Previous Signature: 02/26/2016 11:01:30 AM Version By: Ruthine Dose Entered By: Regan Lemming on 02/26/2016 16:41:23 [name] ([id number]) -------------------------------------------------------------------------------- Lower Extremity Assessment Details Patient Name: [name] Date of Service: 02/26/2016 10:45 AM Medical Record Number: [id number] Patient Account Number: [id number] Date of Birth/Sex: Jan 08, 1946 (70 y.o. Female) Treating RN: Afful, RN, BSN, Velva Harman Primary Care Physician: Caryl Bis, ERIC Other Clinician: Referring Physician: Caryl Bis, ERIC Treating Physician/Extender: Frann Rider in Treatment: 8 Vascular Assessment Pulses: Posterior Tibial Dorsalis Pedis Palpable: [Left:Yes] Extremity colors, hair growth, and conditions: Extremity Color: [Left:Normal] Hair Growth on Extremity: [Left:No] Temperature of Extremity: [Left:Warm] Capillary Refill: [Left:< 3 seconds] Toe Nail Assessment Left: Right: Thick: No Discolored: No Deformed: No Improper Length and Hygiene: No Electronic Signature(s) Signed: 02/26/2016 5:11:59 PM By: Regan Lemming BSN, RN Entered By: Regan Lemming on 02/26/2016 10:48:09 [name] ([id number]) -------------------------------------------------------------------------------- Multi Wound Chart Details Patient Name: [name] Date of Service: 02/26/2016 10:45 AM Medical Record Number: [id number] Patient  Account Number: [id number] Date of Birth/Sex: [birth year] (70 y.o. Female) Treating RN: Baruch Gouty, RN, BSN, Velva Harman Primary Care Physician: Caryl Bis, ERIC Other Clinician: Referring Physician: Caryl Bis, ERIC Treating Physician/Extender: Frann Rider in Treatment: 8 Vital Signs Height(in): 63 Pulse(bpm): 70 Weight(lbs): 130 Blood Pressure 110/49 (mmHg): Body Mass Index(BMI): 23 Temperature(F): 97.8 Respiratory Rate 17 (breaths/min): Photos: [2:No Photos] [N/A:N/A] Wound Location: [2:Left Calcaneus] [N/A:N/A] Wounding Event: [2:Pressure Injury] [N/A:N/A] Primary Etiology: [2:Pressure Ulcer] [N/A:N/A] Secondary Etiology: [2:Diabetic Wound/Ulcer of the Lower Extremity] [N/A:N/A] Comorbid History: [2:Cataracts, Type I Diabetes, Rheumatoid Arthritis, Osteoarthritis, Neuropathy, Received Chemotherapy] [N/A:N/A] Date Acquired: [2:07/15/2015] [N/A:N/A] Weeks of Treatment: [2:8] [N/A:N/A] Wound Status: [2:Open] [  N/A:N/A] Measurements L x W x D 0.4x0.5x0.6 [N/A:N/A] (cm) Area (cm) : [2:0.157] [N/A:N/A] Volume (cm) : [2:0.094] [N/A:N/A] % Reduction in Area: [2:33.50%] [N/A:N/A] % Reduction in Volume: 0.00% [N/A:N/A] Classification: [2:Category/Stage III] [N/A:N/A] HBO Classification: [2:Grade 1] [N/A:N/A] Exudate Amount: [2:Medium] [N/A:N/A] Exudate Type: [2:Serosanguineous] [N/A:N/A] Exudate Color: [2:red, brown] [N/A:N/A] Wound Margin: [2:Flat and Intact] [N/A:N/A] Granulation Amount: [2:Medium (34-66%)] [N/A:N/A] Granulation Quality: [2:Pink, Pale] [N/A:N/A] Necrotic Amount: [2:Small (1-33%)] [N/A:N/A] Exposed Structures: [N/A:N/A] Fascia: No Fat: No Tendon: No Muscle: No Joint: No Bone: No Limited to Skin Breakdown Epithelialization: None N/A N/A Periwound Skin Texture: Edema: Yes N/A N/A Excoriation: No Induration: No Callus: No Crepitus: No Fluctuance: No Friable: No Rash: No Scarring: No Periwound Skin Moist: Yes N/A N/A Moisture: Maceration:  No Dry/Scaly: No Periwound Skin Color: Atrophie Blanche: No N/A N/A Cyanosis: No Ecchymosis: No Erythema: No Hemosiderin Staining: No Mottled: No Pallor: No Rubor: No Temperature: No Abnormality N/A N/A Tenderness on Yes N/A N/A Palpation: Wound Preparation: Ulcer Cleansing: N/A N/A Rinsed/Irrigated with Saline Topical Anesthetic Applied: Other: lidocaine 4% Treatment Notes Electronic Signature(s) Signed: 02/26/2016 5:11:59 PM By: Regan Lemming BSN, RN Entered By: Regan Lemming on 02/26/2016 10:53:31 [name] ([id number]) -------------------------------------------------------------------------------- Multi-Disciplinary Care Plan Details Patient Name: [name] Date of Service: 02/26/2016 10:45 AM Medical Record Number: [id number] Patient Account Number: [id number] Date of Birth/Sex: 1945-10-19 (70 y.o. Female) Treating RN: Afful, RN, BSN, [place] Primary Care Physician: Caryl Bis, ERIC Other Clinician: Referring Physician: Caryl Bis, ERIC Treating Physician/Extender: Frann Rider in Treatment: 8 Active Inactive Orientation to the Wound Care Program Nursing Diagnoses: Knowledge deficit related to the wound healing center program Goals: Patient/caregiver will verbalize understanding of the [place] Program Date Initiated: 12/31/2015 Goal Status: Active Interventions: Provide education on orientation to the wound center Notes: Pressure Nursing Diagnoses: Knowledge deficit related to causes and risk factors for pressure ulcer development Knowledge deficit related to management of pressures ulcers Potential for impaired tissue integrity related to pressure, friction, moisture, and shear Goals: Patient will remain free from development of additional pressure ulcers Date Initiated: 12/31/2015 Goal Status: Active Patient will remain free of pressure ulcers Date Initiated: 12/31/2015 Goal Status: Active Patient/caregiver will verbalize risk  factors for pressure ulcer development Date Initiated: 12/31/2015 Goal Status: Active Patient/caregiver will verbalize understanding of pressure ulcer management Date Initiated: 12/31/2015 Goal Status: Active Interventions: APPIE, DAMM ([id number]) Assess: immobility, friction, shearing, incontinence upon admission and as needed Assess offloading mechanisms upon admission and as needed Assess potential for pressure ulcer upon admission and as needed Provide education on pressure ulcers Treatment Activities: Patient referred for pressure reduction/relief devices : 12/31/2015 Notes: Wound/Skin Impairment Nursing Diagnoses: Impaired tissue integrity Knowledge deficit related to smoking impact on wound healing Knowledge deficit related to ulceration/compromised skin integrity Goals: Patient/caregiver will verbalize understanding of skin care regimen Date Initiated: 12/31/2015 Goal Status: Active Ulcer/skin breakdown will have a volume reduction of 30% by week 4 Date Initiated: 12/31/2015 Goal Status: Active Ulcer/skin breakdown will have a volume reduction of 50% by week 8 Date Initiated: 12/31/2015 Goal Status: Active Ulcer/skin breakdown will have a volume reduction of 80% by week 12 Date Initiated: 12/31/2015 Goal Status: Active Ulcer/skin breakdown will heal within 14 weeks Date Initiated: 12/31/2015 Goal Status: Active Interventions: Assess patient/caregiver ability to obtain necessary supplies Assess patient/caregiver ability to perform ulcer/skin care regimen upon admission and as needed Assess ulceration(s) every visit Provide education on ulcer and skin care Treatment Activities: Skin care regimen initiated : 12/31/2015 Topical wound management  initiated : 12/31/2015 Notes: [name], [name] ([id number]) Electronic Signature(s) Signed: 02/26/2016 5:11:59 PM By: Regan Lemming BSN, RN Entered By: Regan Lemming on 02/26/2016 10:52:04 [name], [name]  ([id number]) -------------------------------------------------------------------------------- Pain Assessment Details Patient Name: [name] Date of Service: 02/26/2016 10:45 AM Medical Record Number: [id number] Patient Account Number: [id number] Date of Birth/Sex: [birth year] (70 y.o. Female) Treating RN: Afful, RN, BSN, Velva Harman Primary Care Physician: Caryl Bis, ERIC Other Clinician: Referring Physician: Caryl Bis, ERIC Treating Physician/Extender: Frann Rider in Treatment: 8 Active Problems Location of Pain Severity and Description of Pain Patient Has Paino Yes Site Locations Pain Location: Pain in Ulcers Rate the pain. Current Pain Level: 4 Character of Pain Describe the Pain: Tender Pain Management and Medication Current Pain Management: How does your pain impact your activities of daily livingo Sleep: Yes Bathing: Yes Appetite: Yes Relationship With Others: Yes Bladder Continence: Yes Emotions: Yes Bowel Continence: Yes Work: Yes Toileting: Yes Drive: Yes Dressing: Yes Hobbies: Yes Electronic Signature(s) Signed: 02/26/2016 10:44:25 AM By: Regan Lemming BSN, RN Entered By: Regan Lemming on 02/26/2016 10:44:24 [name] ([id number]) -------------------------------------------------------------------------------- Patient/Caregiver Education Details Patient Name: [name] Date of Service: 02/26/2016 10:45 AM Medical Record Number: [id number] Patient Account Number: [id number] Date of Birth/Gender: 1946/04/06 (70 y.o. Female) Treating RN: Baruch Gouty, RN, BSN, Velva Harman Primary Care Physician: Caryl Bis, ERIC Other Clinician: Referring Physician: Caryl Bis, ERIC Treating Physician/Extender: Frann Rider in Treatment: 8 Education Assessment Education Provided To: Patient Education Topics Provided Pressure: Methods: Explain/Verbal Responses: State content correctly Welcome To [facility] [place]: Methods: Explain/Verbal Responses:  State content correctly Wound/Skin Impairment: Methods: Explain/Verbal Responses: State content correctly Electronic Signature(s) Signed: 02/26/2016 5:11:59 PM By: Regan Lemming BSN, RN Entered By: Regan Lemming on 02/26/2016 16:41:40 [name] ([id number]) -------------------------------------------------------------------------------- Wound Assessment Details Patient Name: [name] Date of Service: 02/26/2016 10:45 AM Medical Record Number: [id number] Patient Account Number: [id number] Date of Birth/Sex: Mar 30, 1946 (70 y.o. Female) Treating RN: Afful, RN, BSN, [place] Primary Care Physician: Caryl Bis, ERIC Other Clinician: Referring Physician: Caryl Bis, ERIC Treating Physician/Extender: Frann Rider in Treatment: 8 Wound Status Wound Number: 2 Primary Pressure Ulcer Etiology: Wound Location: Left Calcaneus Secondary Diabetic Wound/Ulcer of the Lower Wounding Event: Pressure Injury Etiology: Extremity Date Acquired: 07/15/2015 Wound Open Weeks Of Treatment: 8 Status: Clustered Wound: No Comorbid Cataracts, Type I Diabetes, History: Rheumatoid Arthritis, Osteoarthritis, Neuropathy, Received Chemotherapy Photos Photo Uploaded By: Regan Lemming on 02/26/2016 16:58:10 Wound Measurements Length: (cm) 0.4 Width: (cm) 0.5 Depth: (cm) 0.6 Area: (cm) 0.157 Volume: (cm) 0.094 % Reduction in Area: 33.5% % Reduction in Volume: 0% Epithelialization: None Tunneling: No Undermining: No Wound Description Classification: Category/Stage III Foul Odor A Diabetic Severity (Wagner): Grade 1 Wound Margin: Flat and Intact Exudate Amount: Medium Exudate Type: Serosanguineous Exudate Color: red, brown fter Cleansing: No Wound Bed Granulation Amount: Medium (34-66%) Exposed Structure Granulation Quality: Pink, Pale Fascia Exposed: No Necrotic Amount: Small (1-33%) Fat Layer Exposed: No [name], [name] ([id number]) Necrotic Quality: Adherent Slough Tendon  Exposed: No Muscle Exposed: No Joint Exposed: No Bone Exposed: No Limited to Skin Breakdown Periwound Skin Texture Texture Color No Abnormalities Noted: No No Abnormalities Noted: No Callus: No Atrophie Blanche: No Crepitus: No Cyanosis: No Excoriation: No Ecchymosis: No Fluctuance: No Erythema: No Friable: No Hemosiderin Staining: No Induration: No Mottled: No Localized Edema: Yes Pallor: No Rash: No Rubor: No Scarring: No Temperature / Pain Moisture Temperature: No Abnormality No Abnormalities Noted: No Tenderness on Palpation: Yes Dry / Scaly: No Maceration: No Moist:  Yes Wound Preparation Ulcer Cleansing: Rinsed/Irrigated with Saline Topical Anesthetic Applied: Other: lidocaine 4%, Treatment Notes Wound #2 (Left Calcaneus) 1. Cleansed with: Clean wound with Normal Saline 4. Dressing Applied: Prisma Ag 5. Secondary Dressing Applied Bordered Foam Dressing Electronic Signature(s) Signed: 02/26/2016 5:11:59 PM By: Regan Lemming BSN, RN Entered By: Regan Lemming on 02/26/2016 10:51:54 [name] ([id number]) -------------------------------------------------------------------------------- [place] Details Patient Name: [name] Date of Service: 02/26/2016 10:45 AM Medical Record Number: [id number] Patient Account Number: [id number] Date of Birth/Sex: 1946/03/27 (70 y.o. Female) Treating RN: Afful, RN, BSN, [place] Primary Care Physician: Caryl Bis, ERIC Other Clinician: Referring Physician: Caryl Bis, ERIC Treating Physician/Extender: Frann Rider in Treatment: 8 Vital Signs Time Taken: 10:48 Temperature (F): 97.8 Height (in): 63 Pulse (bpm): 70 Weight (lbs): 130 Respiratory Rate (breaths/min): 17 Body Mass Index (BMI): 23 Blood Pressure (mmHg): 110/49 Reference Range: 80 - 120 mg / dl Electronic Signature(s) Signed: 02/26/2016 5:11:59 PM By: Regan Lemming BSN, RN Entered By: Regan Lemming on 02/26/2016 10:48:46

## 2016-02-27 NOTE — Progress Notes (Signed)
[name], [name] ([id number]) Visit Report for 02/26/2016 Chief Complaint Document Details Patient Name: [name], [name] 02/26/2016 10:45 Date of Service: AM Medical Record [id number] Number: Patient Account Number: [id number] Mar 23, 1946 (70 y.o. Treating RN: Janet Gouty, RN, BSN, [name] Date of Birth/Sex: Female) Other Clinician: Primary Care Physician: [name], [name] Treating [name] Referring Physician: Caryl [name], [name] Physician/Extender: [name] in Treatment: 8 Information Obtained from: Patient Chief Complaint Patient is at the clinic for treatment of an open pressure ulcer to the left heel which she has for over 5 months Electronic Signature(s) Signed: 02/26/2016 11:44:49 AM By: Janet Fudge MD, FACS Entered By: [name] on 02/26/2016 11:44:49 [name] ([id number]) -------------------------------------------------------------------------------- HPI Details Patient Name: [name], [name] 02/26/2016 10:45 Date of Service: AM Medical Record [id number] Number: Patient Account Number: [id number] 1945/11/09 (70 y.o. Treating RN: Afful, RN, BSN, [name] Date of Birth/Sex: Female) Other Clinician: Primary Care Physician: [name], [name] Treating [name] Referring Physician: Caryl [name], [name] Physician/Extender: [name] in Treatment: 8 History of Present Illness Location: ulcerated on the right heel Quality: Patient reports experiencing a dull [name] to affected area(s). Severity: Patient states wound are getting worse. Duration: Patient has had the wound for <5 months prior to presenting for treatment Timing: [name] in wound is Intermittent (comes and goes Context: The wound appeared gradually over time Modifying Factors: Consults to this date include: local care was done as per the medication given by the PCP which may have been medihoney Associated Signs and Symptoms: Patient reports having difficulty standing for long periods. 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 In the past she was seen for a decubitus ulcer on her right gluteal area. She is known to have diabetes mellitus type 1 and her most recent A1c was 7.2% Past medical history significant for leukopenia, cholelithiasis, hypertension, chronic diastolic CHF, coronary artery disease,type 1 diabetes mellitus, rheumatoid arthritis, collagen vascular disease, ovarian cancer, status post CABG in 2014 and status post Port-A-Cath insertion by Dr. Leotis [name] in October 2016. She has quit smoking about 20 years ago. She was advised to use Medihoney with calcium alginate pads to be applied over the wound. 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 01/07/2016 -- x-ray of the left heel done -- IMPRESSION: No radiographic evidence of osteomyelitis. If this remains a clinical concern, recommend MR imaging. 02/05/16: returns today for f/u. denies fever, chills, body aches or malaise. no interval changes regarding health status. 02/12/16: pt is doing well. wound has improved. no s/s of infection. no systemic s/s of infection. She reports will be on vacation next week, and requests appointment in 2 weeks. Electronic Signature(s) Signed: 02/26/2016 11:44:55 AM By: Janet Fudge MD, FACS Entered By: [name] on 02/26/2016 11:44:54 [name], [name] ([name], [name] ([id number]) -------------------------------------------------------------------------------- Physical Exam Details Patient  Name: [name], [name] 02/26/2016 10:45 Date of Service: AM Medical Record [id number] Number: Patient Account Number: [id number] 10/14/1945 (70 y.o. Treating RN: Janet Gouty, RN, BSN, [name] Date of Birth/Sex: Female) Other Clinician: Primary Care Physician: [name], [name] Treating [name],  [name] Referring Physician: Caryl [name], [name] Physician/Extender: [name] in Treatment: 8 Constitutional . Pulse regular. Respirations normal and unlabored. Afebrile. . Eyes Nonicteric. Reactive to light. Ears, Nose, Mouth, and Throat Lips, teeth, and gums WNL.Marland Kitchen Moist mucosa without lesions. Neck supple and nontender. No palpable supraclavicular or cervical adenopathy. Normal sized without goiter. Respiratory WNL. No retractions.. Cardiovascular Pedal Pulses WNL. No clubbing, cyanosis or edema. Lymphatic No adneopathy. No adenopathy. No adenopathy. Musculoskeletal Adexa without tenderness or enlargement.. Digits and nails w/o clubbing, cyanosis, infection, petechiae, ischemia, or inflammatory conditions.. Integumentary (Hair, Skin) No suspicious lesions. No crepitus or fluctuance. No peri-wound warmth or erythema. No masses.Marland Kitchen Psychiatric Judgement and insight Intact.. No evidence of depression, anxiety, or agitation.. Notes is overall improved in quality and though there is some depth there is no necrotic debris and the depth of the wound was flushed out and there is healthy granulation tissue. Electronic Signature(s) Signed: 02/26/2016 11:45:23 AM By: Janet Fudge MD, FACS Entered By: [name] on 02/26/2016 11:45:22 [name] ([id number]) -------------------------------------------------------------------------------- Physician Orders Details Patient Name: [name], [name] 02/26/2016 10:45 Date of Service: AM Medical Record [id number] Number: Patient Account Number: [id number] [birth year] (70 y.o. Treating RN: Afful, RN, BSN, [name] Date of Birth/Sex: Female) Other  Clinician: Primary Care Physician: [name], [name] Treating Briza Bark Referring Physician: Caryl [name], [name] Physician/Extender: [name] in Treatment: 8 Verbal / Phone Orders: Yes Clinician: Afful, RN, BSN, Rita Read Back and Verified: Yes Diagnosis Coding Wound Cleansing Wound #2 Left Calcaneus o Cleanse wound with mild soap and water o May Shower, gently pat wound dry prior to applying new dressing. Anesthetic Wound #2 Left Calcaneus o Topical Lidocaine 4% cream applied to wound bed prior to debridement Skin Barriers/Peri-Wound Care Wound #2 Left Calcaneus o Skin Prep Primary Wound Dressing Wound #2 Left Calcaneus o Prisma Ag Secondary Dressing Wound #2 Left Calcaneus o Boardered Foam Dressing - or Coverllette Bandaid Dressing Change Frequency Wound #2 Left Calcaneus o Change dressing every day. Follow-up Appointments Wound #2 Left Calcaneus o Return Appointment in 1 week. Off-Loading Wound #2 Left Calcaneus o Heel suspension boot to: - ELevate on pillows [name], [name]. ([id number]) Additional Orders / Instructions Wound #2 Left Calcaneus o Increase protein intake. o Activity as tolerated o Other: - INclude Vitamin A, C, ZInc, MVI in diet Electronic Signature(s) Signed: 02/26/2016 4:11:36 PM By: Janet Fudge MD, FACS Signed: 02/26/2016 5:11:59 PM By: Regan Lemming BSN, RN Entered By: Regan Lemming on 02/26/2016 10:56:44 [name], LOSI ([id number]) -------------------------------------------------------------------------------- Problem List Details Patient Name: [name], [name] 02/26/2016 10:45 Date of Service: AM Medical Record [id number] Number: Patient Account Number: [id number] August 05, 1945 (70 y.o. Treating RN: Janet Gouty, RN, BSN, [name] Date of Birth/Sex: Female) Other Clinician: Primary Care Physician: [name], [name] Treating [name] Referring Physician: Caryl [name], [name] Physician/Extender: Weeks in Treatment: 8 Active  Problems ICD-10 Encounter Code Description Active Date Diagnosis E10.621 Type 1 diabetes mellitus with foot ulcer 12/31/2015 Yes L89.623 Pressure ulcer of left heel, stage 3 12/31/2015 Yes Z92.21 Personal history of antineoplastic chemotherapy 12/31/2015 Yes Z79.52 Long term (current) use of systemic steroids 12/31/2015 Yes Inactive Problems Resolved Problems Electronic Signature(s) Signed: 02/26/2016 11:44:23 AM By: Janet Fudge MD, FACS Entered By: [name] on 02/26/2016 11:44:23 [name] ([id number]) -------------------------------------------------------------------------------- Progress Note Details Patient Name: [name], [name] 02/26/2016 10:45 Date of Service: AM Medical Record [id number] Number: Patient Account Number: [id number] July 04, 1945 (70 y.o. Treating RN: Janet Gouty, RN, BSN, [name] Date of Birth/Sex: Female) Other Clinician: Primary Care Physician: [name], [name] Treating Raeli Wiens Referring  Physician: Caryl [name], [name] Physician/Extender: [name] in Treatment: 8 Subjective Chief Complaint Information obtained from Patient Patient is at the clinic for treatment of an open pressure ulcer to the left heel which she has for over 5 months History of Present Illness (HPI) The following HPI elements were documented for the patient's wound: Location: ulcerated on the right heel Quality: Patient reports experiencing a dull [name] to affected area(s). Severity: Patient states wound are getting worse. Duration: Patient has had the wound for Timing: [name] in wound is Intermittent (comes and goes Context: The wound appeared gradually over time Modifying Factors: Consults to this date include: local care was done as per the medication given by the PCP which may have been medihoney Associated Signs and Symptoms: Patient reports having difficulty standing for long periods. 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 In the past she was seen for a decubitus ulcer on her right gluteal area. She is known to have diabetes mellitus type 1 and her most recent A1c was 7.2% Past medical history significant for leukopenia, cholelithiasis, hypertension, chronic diastolic CHF, coronary artery disease,type 1 diabetes mellitus, rheumatoid arthritis, collagen vascular disease, ovarian cancer, status post CABG in 2014 and status post Port-A-Cath insertion by Dr. Leotis [name] in October 2016. She has quit smoking about 20 years ago. She was advised to use Medihoney with calcium alginate pads to be applied over the wound. 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 01/07/2016 -- x-ray of the left heel done -- IMPRESSION: No radiographic evidence of osteomyelitis. If this remains a clinical concern, recommend MR imaging. 02/05/16: returns today for f/u. denies fever, chills, body aches or malaise. no interval changes regarding health status. [name], [name] ([id number]) 02/12/16: pt is doing well. wound has improved. no s/s of infection. no systemic s/s of infection. She reports will be on vacation next week, and requests appointment in 2 weeks. Objective Constitutional Pulse regular. Respirations normal and unlabored. Afebrile. Vitals Time Taken: 10:48 AM, Height: 63 in, Weight: 130 lbs, BMI: 23, Temperature: 97.8 F, Pulse: 70 bpm, Respiratory Rate: 17 breaths/min, Blood Pressure: 110/49 mmHg. Eyes Nonicteric. Reactive to light. Ears, Nose, Mouth, and Throat Lips, teeth, and gums WNL.Marland Kitchen Moist  mucosa without lesions. Neck supple and nontender. No palpable supraclavicular or cervical adenopathy. Normal sized without goiter. Respiratory WNL. No retractions.. Cardiovascular Pedal Pulses WNL. No clubbing, cyanosis or edema. Lymphatic No adneopathy. No adenopathy. No adenopathy. Musculoskeletal Adexa without tenderness or enlargement.. Digits and nails w/o clubbing, cyanosis, infection, petechiae, ischemia, or inflammatory conditions.Marland Kitchen Psychiatric Judgement and insight Intact.. No evidence of depression, anxiety, or agitation.. General Notes: is overall improved in quality and though there is some depth there is no necrotic debris and the depth of the wound was flushed out and there is healthy granulation tissue. Integumentary (Hair, Skin) No suspicious lesions. No crepitus or fluctuance. No peri-wound warmth or erythema. No masses.. Wound #2 status is Open. Original cause of wound was Pressure Injury. The wound is located on the Left LAKIEA, VERON. ([id number]) Calcaneus. The wound  measures 0.4cm length x 0.5cm width x 0.6cm depth; 0.157cm^2 area and 0.094cm^3 volume. The wound is limited to skin breakdown. There is no tunneling or undermining noted. There is a medium amount of serosanguineous drainage noted. The wound margin is flat and intact. There is medium (34-66%) pink, pale granulation within the wound bed. There is a small (1-33%) amount of necrotic tissue within the wound bed including Adherent Slough. The periwound skin appearance exhibited: Localized Edema, Moist. The periwound skin appearance did not exhibit: Callus, Crepitus, Excoriation, Fluctuance, Friable, Induration, Rash, Scarring, Dry/Scaly, Maceration, Atrophie Blanche, Cyanosis, Ecchymosis, Hemosiderin Staining, Mottled, Pallor, Rubor, Erythema. Periwound temperature was noted as No Abnormality. The periwound has tenderness on palpation. Assessment Active Problems ICD-10 E10.621 - Type 1 diabetes mellitus  with foot ulcer L89.623 - Pressure ulcer of left heel, stage 3 Z92.21 - Personal history of antineoplastic chemotherapy Z79.52 - Long term (current) use of systemic steroids Plan Wound Cleansing: Wound #2 Left Calcaneus: Cleanse wound with mild soap and water May Shower, gently pat wound dry prior to applying new dressing. Anesthetic: Wound #2 Left Calcaneus: Topical Lidocaine 4% cream applied to wound bed prior to debridement Skin Barriers/Peri-Wound Care: Wound #2 Left Calcaneus: Skin Prep Primary Wound Dressing: Wound #2 Left Calcaneus: Prisma Ag Secondary Dressing: Wound #2 Left Calcaneus: Boardered Foam Dressing - or Coverllette Bandaid Dressing Change Frequency: Wound #2 Left Calcaneus: Change dressing every day. Follow-up Appointments: SAJDAH, TUNE ([id number]) Wound #2 Left Calcaneus: Return Appointment in 1 week. Off-Loading: Wound #2 Left Calcaneus: Heel suspension boot to: - ELevate on pillows Additional Orders / Instructions: Wound #2 Left Calcaneus: Increase protein intake. Activity as tolerated Other: - INclude Vitamin A, C, ZInc, MVI in diet I have recommended packing the wound with Prisma AG on alternate days and placing a bordered foam. Discussion about wound care and how he should do the flushing of the wound has been discussed in detail with the husband was the caregiver She will come back and see me next week Electronic Signature(s) Signed: 02/26/2016 11:46:19 AM By: Janet Fudge MD, FACS Entered By: [name] on 02/26/2016 11:46:19 [name] ([id number]) -------------------------------------------------------------------------------- SuperBill Details Patient Name: [name] Date of Service: 02/26/2016 Medical Record Patient Account Number: [id number] Number: Afful, RN, BSN, Treating RN: 1946-04-06 (70 y.o. [name] Date of Birth/Sex: Female) Other Clinician: Primary Care Physician: [name], [name]  Treating [name] Referring Physician: Caryl [name], [name] Physician/Extender: [name] in Treatment: 8 Diagnosis Coding ICD-10 Codes Code Description E10.621 Type 1 diabetes mellitus with foot ulcer L89.623 Pressure ulcer of left heel, stage 3 Z92.21 Personal history of antineoplastic chemotherapy Z79.52 Long term (current) use of systemic steroids Physician Procedures CPT4 Code: [id number] Description: [id number] - WC PHYS LEVEL 3 - EST PT ICD-10 Description Diagnosis E10.621 Type 1 diabetes mellitus with foot ulcer L89.623 Pressure ulcer of left heel, stage 3 Z92.21 Personal history of antineoplastic chemothera Z79.52 Long term (current) use  of systemic steroids Modifier: py Quantity: 1 Electronic Signature(s) Signed: 02/26/2016 11:46:34 AM By: Janet Fudge MD, FACS Entered By: [name] on 02/26/2016 11:46:33

## 2016-03-04 ENCOUNTER — Encounter: Payer: Medicare Other | Attending: Surgery | Admitting: Surgery

## 2016-03-04 DIAGNOSIS — Z9221 Personal history of antineoplastic chemotherapy: Secondary | ICD-10-CM | POA: Insufficient documentation

## 2016-03-04 DIAGNOSIS — Z79899 Other long term (current) drug therapy: Secondary | ICD-10-CM | POA: Diagnosis not present

## 2016-03-04 DIAGNOSIS — C569 Malignant neoplasm of unspecified ovary: Secondary | ICD-10-CM | POA: Insufficient documentation

## 2016-03-04 DIAGNOSIS — Z794 Long term (current) use of insulin: Secondary | ICD-10-CM | POA: Insufficient documentation

## 2016-03-04 DIAGNOSIS — I110 Hypertensive heart disease with heart failure: Secondary | ICD-10-CM | POA: Diagnosis not present

## 2016-03-04 DIAGNOSIS — C787 Secondary malignant neoplasm of liver and intrahepatic bile duct: Secondary | ICD-10-CM | POA: Diagnosis not present

## 2016-03-04 DIAGNOSIS — Z7952 Long term (current) use of systemic steroids: Secondary | ICD-10-CM | POA: Diagnosis not present

## 2016-03-04 DIAGNOSIS — E1040 Type 1 diabetes mellitus with diabetic neuropathy, unspecified: Secondary | ICD-10-CM | POA: Diagnosis not present

## 2016-03-04 DIAGNOSIS — I2510 Atherosclerotic heart disease of native coronary artery without angina pectoris: Secondary | ICD-10-CM | POA: Diagnosis not present

## 2016-03-04 DIAGNOSIS — I5032 Chronic diastolic (congestive) heart failure: Secondary | ICD-10-CM | POA: Insufficient documentation

## 2016-03-04 DIAGNOSIS — L89623 Pressure ulcer of left heel, stage 3: Secondary | ICD-10-CM | POA: Diagnosis not present

## 2016-03-04 DIAGNOSIS — M069 Rheumatoid arthritis, unspecified: Secondary | ICD-10-CM | POA: Diagnosis not present

## 2016-03-04 DIAGNOSIS — Z87891 Personal history of nicotine dependence: Secondary | ICD-10-CM | POA: Diagnosis not present

## 2016-03-04 DIAGNOSIS — E10621 Type 1 diabetes mellitus with foot ulcer: Secondary | ICD-10-CM | POA: Insufficient documentation

## 2016-03-05 NOTE — Progress Notes (Signed)
GROVER, ZOOK ([id number]) Visit Report for 03/04/2016 Chief Complaint Document Details Patient Name: [name], [name] 03/04/2016 11:00 Date of Service: AM Medical Record [id number] Number: Patient Account Number: [id number] [birth year] (70 y.o. Treating RN: Baruch Gouty, RN, BSN, Velva Harman Date of Birth/Sex: Female) Other Clinician: Primary Care Physician: Caryl Bis, ERIC Treating Christin Fudge Referring Physician: Caryl Bis, ERIC Physician/Extender: Suella Grove in Treatment: 9 Information Obtained from: Patient Chief Complaint Patient is at the clinic for treatment of an open pressure ulcer to the left heel which she has for over 5 months Electronic Signature(s) Signed: 03/04/2016 10:59:43 AM By: Christin Fudge MD, FACS Entered By: Christin Fudge on 03/04/2016 10:59:43 [name] ([id number]) -------------------------------------------------------------------------------- HPI Details Patient Name: [name], [name] 03/04/2016 11:00 Date of Service: AM Medical Record [id number] Number: Patient Account Number: [id number] 04-Feb-1946 (70 y.o. Treating RN: Afful, RN, BSN, Velva Harman Date of Birth/Sex: Female) Other Clinician: Primary Care Physician: Caryl Bis, ERIC Treating Kennis Buell Referring Physician: Caryl Bis, ERIC Physician/Extender: Weeks in Treatment: 9 History of Present Illness Location: ulcerated on the right heel Quality: Patient reports experiencing a dull pain to affected area(s). Severity: Patient states wound are getting worse. Duration: Patient has had the wound for <5 months prior to presenting for treatment Timing: Pain in wound is Intermittent (comes and goes Context: The wound appeared gradually over time Modifying Factors: Consults to this date include: local care was done as per the medication given by the PCP which may have been medihoney Associated Signs and Symptoms: Patient reports having difficulty standing for long periods. 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 In the past she was seen for a decubitus ulcer on her right gluteal area. She is known to have diabetes mellitus type 1 and her most recent A1c was 7.2% Past medical history significant for leukopenia, cholelithiasis, hypertension, chronic diastolic CHF, coronary artery disease,type 1 diabetes mellitus, rheumatoid arthritis, collagen vascular disease, ovarian cancer, status post CABG in 2014 and status post Port-A-Cath insertion by Dr. Leotis Pain in October 2016. She has quit smoking about 20 years ago. She was advised to use Medihoney with calcium alginate pads to be applied over the wound. 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 01/07/2016 -- x-ray of the left heel done -- IMPRESSION: No radiographic evidence of osteomyelitis. If this remains a clinical concern, recommend MR imaging. 02/05/16: returns today for f/u. denies fever, chills, body aches or malaise. no interval changes regarding health status. 02/12/16: pt is doing well. wound has improved. no s/s of infection. no systemic s/s of infection. She reports will be on vacation next week, and requests appointment in 2 weeks. Electronic Signature(s) Signed: 03/04/2016 10:59:47 AM By: Christin Fudge MD, FACS Entered By: Christin Fudge on 03/04/2016 10:59:47 LEKEISHA, HOCHSTRASSER ([id number], PRUETT ([id number]) -------------------------------------------------------------------------------- Physical Exam Details Patient  Name: [name], [name] 03/04/2016 11:00 Date of Service: AM Medical Record [id number] Number: Patient Account Number: [id number] Mar 26, 1946 (70 y.o. Treating RN: Baruch Gouty, RN, BSN, Velva Harman Date of Birth/Sex: Female) Other Clinician: Primary Care Physician: Caryl Bis, ERIC Treating Riel Hirschman,  Donnamarie Shankles Referring Physician: Caryl Bis, ERIC Physician/Extender: Suella Grove in Treatment: 9 Constitutional . Pulse regular. Respirations normal and unlabored. Afebrile. . Eyes Nonicteric. Reactive to light. Ears, Nose, Mouth, and Throat Lips, teeth, and gums WNL.Marland Kitchen Moist mucosa without lesions. Neck supple and nontender. No palpable supraclavicular or cervical adenopathy. Normal sized without goiter. Respiratory WNL. No retractions.. Breath sounds WNL, No rubs, rales, rhonchi, or wheeze.. Cardiovascular Heart rhythm and rate regular, no murmur or gallop.. Pedal Pulses WNL. No clubbing, cyanosis or edema. Chest Breasts symmetical and no nipple discharge.. Breast tissue WNL, no masses, lumps, or tenderness.. Lymphatic No adneopathy. No adenopathy. No adenopathy. Musculoskeletal Adexa without tenderness or enlargement.. Digits and nails w/o clubbing, cyanosis, infection, petechiae, ischemia, or inflammatory conditions.. Integumentary (Hair, Skin) No suspicious lesions. No crepitus or fluctuance. No peri-wound warmth or erythema. No masses.Marland Kitchen Psychiatric Judgement and insight Intact.. No evidence of depression, anxiety, or agitation.. Notes the wound is looking good and I have been able to irrigate it and remove any superficial debris and there is no need of sharp debridement. Electronic Signature(s) Signed: 03/04/2016 11:00:33 AM By: Christin Fudge MD, FACS Previous Signature: 03/04/2016 11:00:14 AM Version By: Christin Fudge MD, FACS Entered By: Christin Fudge on 03/04/2016 11:00:32 DRINA, GOLDSMITH ([id number]) -------------------------------------------------------------------------------- Physician Orders  Details Patient Name: [name] 03/04/2016 11:00 Date of Service: AM Medical Record [id number] Number: Patient Account Number: [id number] [birth year] (70 y.o. Treating RN: Afful, RN, BSN, Velva Harman Date of Birth/Sex: Female) Other Clinician: Primary Care Physician: Caryl Bis, ERIC Treating Hedaya Latendresse Referring Physician: Caryl Bis, ERIC Physician/Extender: Suella Grove in Treatment: 9 Verbal / Phone Orders: Yes Clinician: Afful, RN, BSN, Rita Read Back and Verified: Yes Diagnosis Coding Wound Cleansing Wound #2 Left Calcaneus o Cleanse wound with mild soap and water o May Shower, gently pat wound dry prior to applying new dressing. Anesthetic Wound #2 Left Calcaneus o Topical Lidocaine 4% cream applied to wound bed prior to debridement Skin Barriers/Peri-Wound Care Wound #2 Left Calcaneus o Skin Prep Primary Wound Dressing Wound #2 Left Calcaneus o Prisma Ag Secondary Dressing Wound #2 Left Calcaneus o Boardered Foam Dressing - or Coverllette Bandaid Dressing Change Frequency Wound #2 Left Calcaneus o Change dressing every day. Follow-up Appointments Wound #2 Left Calcaneus o Return Appointment in 1 week. Off-Loading Wound #2 Left Calcaneus o Heel suspension boot to: - ELevate on pillows ANIELA, MISA. ([id number]) Additional Orders / Instructions Wound #2 Left Calcaneus o Increase protein intake. o Activity as tolerated o Other: - INclude Vitamin A, C, ZInc, MVI in diet Electronic Signature(s) Signed: 03/04/2016 3:57:28 PM By: Regan Lemming BSN, RN Signed: 03/04/2016 4:03:12 PM By: Christin Fudge MD, FACS Entered By: Regan Lemming on 03/04/2016 10:53:24 [name] ([id number]) -------------------------------------------------------------------------------- Problem List Details Patient Name: BONITA, GIORDANI. 03/04/2016 11:00 Date of Service: AM Medical Record [id number] Number: Patient Account Number: [id number] [birth year] (71 y.o.  Treating RN: Baruch Gouty, RN, BSN, Velva Harman Date of Birth/Sex: Female) Other Clinician: Primary Care Physician: Caryl Bis, ERIC Treating Christin Fudge Referring Physician: Caryl Bis, ERIC Physician/Extender: Weeks in Treatment: 9 Active Problems ICD-10 Encounter Code Description Active Date Diagnosis E10.621 Type 1 diabetes mellitus with foot ulcer 12/31/2015 Yes L89.623 Pressure ulcer of left heel, stage 3 12/31/2015 Yes Z92.21 Personal history of antineoplastic chemotherapy 12/31/2015 Yes Z79.52 Long term (current) use of systemic steroids 12/31/2015 Yes Inactive Problems Resolved Problems Electronic Signature(s) Signed: 03/04/2016 10:59:36 AM By: Christin Fudge MD, FACS Entered By: Christin Fudge on 03/04/2016 10:59:36 [name] ([id number]) -------------------------------------------------------------------------------- Progress Note Details Patient Name: RENEZMAE, DENKER  W. 03/04/2016 11:00 Date of Service: AM Medical Record [id number] Number: Patient Account Number: [id number] 04-30-1946 (70 y.o. Treating RN: Baruch Gouty, RN, BSN, Velva Harman Date of Birth/Sex: Female) Other Clinician: Primary Care Physician: Caryl Bis, ERIC Treating Kollin Udell Referring Physician: Caryl Bis, ERIC Physician/Extender: Weeks in Treatment: 9 Subjective Chief Complaint Information obtained from Patient Patient is at the clinic for treatment of an open pressure ulcer to the left heel which she has for over 5 months History of Present Illness (HPI) The following HPI elements were documented for the patient's wound: Location: ulcerated on the right heel Quality: Patient reports experiencing a dull pain to affected area(s). Severity: Patient states wound are getting worse. Duration: Patient has had the wound for Timing: Pain in wound is Intermittent (comes and goes Context: The wound appeared gradually over time Modifying Factors: Consults to this date include: local care was done as per the medication  given by the PCP which may have been medihoney Associated Signs and Symptoms: Patient reports having difficulty standing for long periods. 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 In the past she was seen for a decubitus ulcer on her right gluteal area. She is known to have diabetes mellitus type 1 and her most recent A1c was 7.2% Past medical history significant for leukopenia, cholelithiasis, hypertension, chronic diastolic CHF, coronary artery disease,type 1 diabetes mellitus, rheumatoid arthritis, collagen vascular disease, ovarian cancer, status post CABG in 2014 and status post Port-A-Cath insertion by Dr. Leotis Pain in October 2016. She has quit smoking about 20 years ago. She was advised to use Medihoney with calcium alginate pads to be applied over the wound. 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 01/07/2016 -- x-ray of the left heel done -- IMPRESSION: No radiographic evidence of osteomyelitis. If this remains a clinical concern, recommend MR imaging. 02/05/16: returns today for f/u. denies fever, chills, body aches or malaise. no interval changes regarding health status. ARIYAN, CLEMENS ([id number]) 02/12/16: pt is doing well. wound has improved. no s/s of infection. no systemic s/s of infection. She reports will be on vacation next week, and requests appointment in 2 weeks. Objective Constitutional Pulse regular. Respirations normal and unlabored. Afebrile. Vitals Time Taken: 10:42 AM, Height:  63 in, Weight: 130 lbs, BMI: 23, Temperature: 98.3 F, Pulse: 75 bpm, Respiratory Rate: 16 breaths/min, Blood Pressure: 123/58 mmHg. Eyes Nonicteric. Reactive to light. Ears, Nose, Mouth, and Throat Lips, teeth, and gums WNL.Marland Kitchen Moist mucosa without lesions. Neck supple and nontender. No palpable supraclavicular or cervical adenopathy. Normal sized without goiter. Respiratory WNL. No retractions.. Breath sounds WNL, No rubs, rales, rhonchi, or wheeze.. Cardiovascular Heart rhythm and rate regular, no murmur or gallop.. Pedal Pulses WNL. No clubbing, cyanosis or edema. Chest Breasts symmetical and no nipple discharge.. Breast tissue WNL, no masses, lumps, or tenderness.. Lymphatic No adneopathy. No adenopathy. No adenopathy. Musculoskeletal Adexa without tenderness or enlargement.. Digits and nails w/o clubbing, cyanosis, infection, petechiae, ischemia, or inflammatory conditions.Marland Kitchen Psychiatric Judgement and insight Intact.. No evidence of depression, anxiety, or agitation.. General Notes: the wound  is looking good and I have been able to irrigate it and remove any superficial debris and there is no need of sharp debridement. Integumentary (Hair, Skin) YAELI, HERMAN. ([id number]) No suspicious lesions. No crepitus or fluctuance. No peri-wound warmth or erythema. No masses.. Wound #2 status is Open. Original cause of wound was Pressure Injury. The wound is located on the Left Calcaneus. The wound measures 0.5cm length x 0.5cm width x 0.8cm depth; 0.196cm^2 area and 0.157cm^3 volume. The wound is limited to skin breakdown. There is no tunneling or undermining noted. There is a medium amount of serosanguineous drainage noted. The wound margin is flat and intact. There is medium (34-66%) pink, pale granulation within the wound bed. There is a small (1-33%) amount of necrotic tissue within the wound bed including Adherent Slough. The periwound skin appearance exhibited: Localized Edema,  Moist. The periwound skin appearance did not exhibit: Callus, Crepitus, Excoriation, Fluctuance, Friable, Induration, Rash, Scarring, Dry/Scaly, Maceration, Atrophie Blanche, Cyanosis, Ecchymosis, Hemosiderin Staining, Mottled, Pallor, Rubor, Erythema. Periwound temperature was noted as No Abnormality. The periwound has tenderness on palpation. Assessment Active Problems ICD-10 E10.621 - Type 1 diabetes mellitus with foot ulcer L89.623 - Pressure ulcer of left heel, stage 3 Z92.21 - Personal history of antineoplastic chemotherapy Z79.52 - Long term (current) use of systemic steroids Plan Wound Cleansing: Wound #2 Left Calcaneus: Cleanse wound with mild soap and water May Shower, gently pat wound dry prior to applying new dressing. Anesthetic: Wound #2 Left Calcaneus: Topical Lidocaine 4% cream applied to wound bed prior to debridement Skin Barriers/Peri-Wound Care: Wound #2 Left Calcaneus: Skin Prep Primary Wound Dressing: Wound #2 Left Calcaneus: Prisma Ag Secondary Dressing: Wound #2 Left Calcaneus: Boardered Foam Dressing - or Coverllette Bandaid Dressing Change Frequency: TAZIYA, ROBIN ([id number]) Wound #2 Left Calcaneus: Change dressing every day. Follow-up Appointments: Wound #2 Left Calcaneus: Return Appointment in 1 week. Off-Loading: Wound #2 Left Calcaneus: Heel suspension boot to: - ELevate on pillows Additional Orders / Instructions: Wound #2 Left Calcaneus: Increase protein intake. Activity as tolerated Other: - INclude Vitamin A, C, ZInc, MVI in diet I have recommended packing the wound with Prisma AG on alternate days and placing a bordered foam. Discussion about wound care and how he should do the flushing of the wound has been discussed in detail with the husband was the caregiver She will come back and see me next week Electronic Signature(s) Signed: 03/04/2016 11:01:00 AM By: Christin Fudge MD, FACS Entered By: Christin Fudge on 03/04/2016  11:00:59 [name] ([id number]) -------------------------------------------------------------------------------- SuperBill Details Patient Name: [name] Date of Service: 03/04/2016 Medical Record Patient Account Number: [id number] Number: Afful, RN, BSN, Treating RN: [birth year] (70 y.o. Velva Harman Date of Birth/Sex: Female) Other Clinician: Primary Care Physician: Caryl Bis, ERIC Treating Christin Fudge Referring Physician: Caryl Bis, ERIC Physician/Extender: Suella Grove in Treatment: 9 Diagnosis Coding ICD-10 Codes Code Description E10.621 Type 1 diabetes mellitus with foot ulcer L89.623 Pressure ulcer of left heel, stage 3 Z92.21 Personal history of antineoplastic chemotherapy Z79.52 Long term (current) use of systemic steroids Facility Procedures CPT4 Code: [id number] Description: [id number] - WOUND CARE VISIT-LEV 2 EST PT Modifier: Quantity: 1 Physician Procedures CPT4 Code: [id number] Description: [id number] - WC PHYS LEVEL 3 - EST PT ICD-10 Description Diagnosis E10.621 Type 1 diabetes mellitus with foot ulcer L89.623 Pressure ulcer of left heel, stage 3 Z92.21 Personal history of antineoplastic chemothera Z79.52 Long term (current) use  of systemic steroids Modifier: py Quantity: 1 Electronic Signature(s) Signed: 03/04/2016 11:01:13 AM By: Con Memos,  Roderick Pee MD, FACS Entered By: Christin Fudge on 03/04/2016 11:01:13

## 2016-03-05 NOTE — Progress Notes (Signed)
CHINELO, HEMRIC ([id number]) Visit Report for 03/04/2016 Arrival Information Details Patient Name: [name], [name] Date of Service: 03/04/2016 11:00 AM Medical Record Number: [id number] Patient Account Number: [id number] Date of Birth/Sex: 1946/04/18 (70 y.o. Female) Treating RN: Afful, RN, BSN, Velva Harman Primary Care Physician: Caryl Bis, ERIC Other Clinician: Referring Physician: Caryl Bis, ERIC Treating Physician/Extender: Frann Rider in Treatment: 9 Visit Information History Since Last Visit All ordered tests and consults were completed: No Patient Arrived: Cane Added or deleted any medications: No Arrival Time: 10:41 Any new allergies or adverse reactions: No Accompanied By: hubby Had a fall or experienced change in No Transfer Assistance: None activities of daily living that may affect Patient Identification Verified: Yes risk of falls: Secondary Verification Process Yes Signs or symptoms of abuse/neglect since last No Completed: visito Patient Requires Transmission- No Hospitalized since last visit: No Based Precautions: Has Dressing in Place as Prescribed: Yes Patient Has Alerts: Yes Pain Present Now: No Patient Alerts: Patient on Blood Thinner lovenox BID DMII Electronic Signature(s) Signed: 03/04/2016 3:57:28 PM By: Regan Lemming BSN, RN Entered By: Regan Lemming on 03/04/2016 10:42:12 [name] ([id number]) -------------------------------------------------------------------------------- Clinic Level of Care Assessment Details Patient Name: [name] Date of Service: 03/04/2016 11:00 AM Medical Record Number: [id number] Patient Account Number: [id number] Date of Birth/Sex: 02-13-1946 (70 y.o. Female) Treating RN: Afful, RN, BSN, [place] Primary Care Physician: Caryl Bis, ERIC Other Clinician: Referring Physician: Caryl Bis, ERIC Treating Physician/Extender: Frann Rider in Treatment: 9 Clinic Level of Care Assessment Items TOOL 4  Quantity Score []  - Use when only an EandM is performed on FOLLOW-UP visit 0 ASSESSMENTS - Nursing Assessment / Reassessment X - Reassessment of Co-morbidities (includes updates in patient status) 1 10 X - Reassessment of Adherence to Treatment Plan 1 5 ASSESSMENTS - Wound and Skin Assessment / Reassessment []  - Simple Wound Assessment / Reassessment - one wound 0 []  - Complex Wound Assessment / Reassessment - multiple wounds 0 []  - Dermatologic / Skin Assessment (not related to wound area) 0 ASSESSMENTS - Focused Assessment []  - Circumferential Edema Measurements - multi extremities 0 []  - Nutritional Assessment / Counseling / Intervention 0 X - Lower Extremity Assessment (monofilament, tuning fork, pulses) 1 5 []  - Peripheral Arterial Disease Assessment (using hand held doppler) 0 ASSESSMENTS - Ostomy and/or Continence Assessment and Care []  - Incontinence Assessment and Management 0 []  - Ostomy Care Assessment and Management (repouching, etc.) 0 PROCESS - Coordination of Care X - Simple Patient / Family Education for ongoing care 1 15 []  - Complex (extensive) Patient / Family Education for ongoing care 0 []  - Staff obtains Programmer, systems, Records, Test Results / Process Orders 0 []  - Staff telephones HHA, Nursing Homes / Clarify orders / etc 0 []  - Routine Transfer to another Facility (non-emergent condition) 0 [name], [name]. ([id number]) []  - Routine Hospital Admission (non-emergent condition) 0 []  - New Admissions / Biomedical engineer / Ordering NPWT, Apligraf, etc. 0 []  - Emergency Hospital Admission (emergent condition) 0 []  - Simple Discharge Coordination 0 []  - Complex (extensive) Discharge Coordination 0 PROCESS - Special Needs []  - Pediatric / Minor Patient Management 0 []  - Isolation Patient Management 0 []  - Hearing / Language / Visual special needs 0 []  - Assessment of Community assistance (transportation, D/C planning, etc.) 0 []  - Additional assistance / Altered  mentation 0 []  - Support Surface(s) Assessment (bed, cushion, seat, etc.) 0 INTERVENTIONS - Wound Cleansing / Measurement X - Simple Wound Cleansing - one wound 1 5 []  -  Complex Wound Cleansing - multiple wounds 0 X - Wound Imaging (photographs - any number of wounds) 1 5 []  - Wound Tracing (instead of photographs) 0 X - Simple Wound Measurement - one wound 1 5 []  - Complex Wound Measurement - multiple wounds 0 INTERVENTIONS - Wound Dressings X - Small Wound Dressing one or multiple wounds 1 10 []  - Medium Wound Dressing one or multiple wounds 0 []  - Large Wound Dressing one or multiple wounds 0 []  - Application of Medications - topical 0 []  - Application of Medications - injection 0 INTERVENTIONS - Miscellaneous []  - External ear exam 0 [name], [name]. ([id number]) []  - Specimen Collection (cultures, biopsies, blood, body fluids, etc.) 0 []  - Specimen(s) / Culture(s) sent or taken to Lab for analysis 0 []  - Patient Transfer (multiple staff / Harrel Lemon Lift / Similar devices) 0 []  - Simple Staple / Suture removal (25 or less) 0 []  - Complex Staple / Suture removal (26 or more) 0 []  - Hypo / Hyperglycemic Management (close monitor of Blood Glucose) 0 []  - Ankle / Brachial Index (ABI) - do not check if billed separately 0 X - Vital Signs 1 5 Has the patient been seen at the hospital within the last three years: Yes Total Score: 65 Level Of Care: New/Established - Level 2 Electronic Signature(s) Signed: 03/04/2016 3:57:28 PM By: Regan Lemming BSN, RN Entered By: Regan Lemming on 03/04/2016 10:53:50 [name] ([id number]) -------------------------------------------------------------------------------- Encounter Discharge Information Details Patient Name: [name] Date of Service: 03/04/2016 11:00 AM Medical Record Number: [id number] Patient Account Number: [id number] Date of Birth/Sex: 1945-06-09 (70 y.o. Female) Treating RN: Baruch Gouty, RN, BSN, Velva Harman Primary Care Physician:  Caryl Bis, ERIC Other Clinician: Referring Physician: Caryl Bis, ERIC Treating Physician/Extender: Frann Rider in Treatment: 9 Encounter Discharge Information Items Discharge Pain Level: 0 Discharge Condition: Stable Ambulatory Status: Cane Discharge Destination: Home Transportation: Private Auto Accompanied By: Micheline Rough Schedule Follow-up Appointment: No Medication Reconciliation completed No and provided to Patient/Care Delina Kruczek: Provided on Clinical Summary of Care: 03/04/2016 Form Type Recipient Paper Patient SR Electronic Signature(s) Signed: 03/04/2016 11:13:02 AM By: Regan Lemming BSN, RN Previous Signature: 03/04/2016 10:58:14 AM Version By: Ruthine Dose Entered By: Regan Lemming on 03/04/2016 11:13:02 [name] ([id number]) -------------------------------------------------------------------------------- Lower Extremity Assessment Details Patient Name: [name] Date of Service: 03/04/2016 11:00 AM Medical Record Number: [id number] Patient Account Number: [id number] Date of Birth/Sex: [birth year] (70 y.o. Female) Treating RN: Afful, RN, BSN, Velva Harman Primary Care Physician: Caryl Bis, ERIC Other Clinician: Referring Physician: Caryl Bis, ERIC Treating Physician/Extender: Frann Rider in Treatment: 9 Vascular Assessment Pulses: Posterior Tibial Dorsalis Pedis Palpable: [Left:Yes] Extremity colors, hair growth, and conditions: Extremity Color: [Left:Normal] Hair Growth on Extremity: [Left:No] Temperature of Extremity: [Left:Warm] Capillary Refill: [Left:< 3 seconds] Toe Nail Assessment Left: Right: Thick: No Discolored: No Deformed: No Improper Length and Hygiene: No Electronic Signature(s) Signed: 03/04/2016 3:57:28 PM By: Regan Lemming BSN, RN Entered By: Regan Lemming on 03/04/2016 10:44:47 [name] ([id number]) -------------------------------------------------------------------------------- Multi Wound Chart Details Patient  Name: [name] Date of Service: 03/04/2016 11:00 AM Medical Record Number: [id number] Patient Account Number: [id number] Date of Birth/Sex: March 14, 1946 (70 y.o. Female) Treating RN: Baruch Gouty, RN, BSN, Velva Harman Primary Care Physician: Caryl Bis, ERIC Other Clinician: Referring Physician: Caryl Bis, ERIC Treating Physician/Extender: Frann Rider in Treatment: 9 Vital Signs Height(in): 63 Pulse(bpm): 75 Weight(lbs): 130 Blood Pressure 123/58 (mmHg): Body Mass Index(BMI): 23 Temperature(F): 98.3 Respiratory Rate 16 (breaths/min): Photos: [2:No Photos] [N/A:N/A] Wound Location: [2:Left Calcaneus] [N/A:N/A] Wounding  Event: [2:Pressure Injury] [N/A:N/A] Primary Etiology: [2:Pressure Ulcer] [N/A:N/A] Secondary Etiology: [2:Diabetic Wound/Ulcer of the Lower Extremity] [N/A:N/A] Comorbid History: [2:Cataracts, Type I Diabetes, Rheumatoid Arthritis, Osteoarthritis, Neuropathy, Received Chemotherapy] [N/A:N/A] Date Acquired: [2:07/15/2015] [N/A:N/A] Weeks of Treatment: [2:9] [N/A:N/A] Wound Status: [2:Open] [N/A:N/A] Measurements L x W x D 0.5x0.5x0.8 [N/A:N/A] (cm) Area (cm) : [2:0.196] [N/A:N/A] Volume (cm) : [2:0.157] [N/A:N/A] % Reduction in Area: [2:16.90%] [N/A:N/A] % Reduction in Volume: -67.00% [N/A:N/A] Classification: [2:Category/Stage III] [N/A:N/A] HBO Classification: [2:Grade 1] [N/A:N/A] Exudate Amount: [2:Medium] [N/A:N/A] Exudate Type: [2:Serosanguineous] [N/A:N/A] Exudate Color: [2:red, brown] [N/A:N/A] Wound Margin: [2:Flat and Intact] [N/A:N/A] Granulation Amount: [2:Medium (34-66%)] [N/A:N/A] Granulation Quality: [2:Pink, Pale] [N/A:N/A] Necrotic Amount: [2:Small (1-33%)] [N/A:N/A] Exposed Structures: [N/A:N/A] Fascia: No Fat: No Tendon: No Muscle: No Joint: No Bone: No Limited to Skin Breakdown Epithelialization: None N/A N/A Periwound Skin Texture: Edema: Yes N/A N/A Excoriation: No Induration: No Callus: No Crepitus: No Fluctuance:  No Friable: No Rash: No Scarring: No Periwound Skin Moist: Yes N/A N/A Moisture: Maceration: No Dry/Scaly: No Periwound Skin Color: Atrophie Blanche: No N/A N/A Cyanosis: No Ecchymosis: No Erythema: No Hemosiderin Staining: No Mottled: No Pallor: No Rubor: No Temperature: No Abnormality N/A N/A Tenderness on Yes N/A N/A Palpation: Wound Preparation: Ulcer Cleansing: N/A N/A Rinsed/Irrigated with Saline Topical Anesthetic Applied: Other: lidocaine 4% Treatment Notes Electronic Signature(s) Signed: 03/04/2016 3:57:28 PM By: Regan Lemming BSN, RN Entered By: Regan Lemming on 03/04/2016 10:52:48 [name] ([id number]) -------------------------------------------------------------------------------- [place] Details Patient Name: [name] Date of Service: 03/04/2016 11:00 AM Medical Record Number: [id number] Patient Account Number: [id number] Date of Birth/Sex: 08/30/1945 (70 y.o. Female) Treating RN: Afful, RN, BSN, [place] Primary Care Physician: Caryl Bis, ERIC Other Clinician: Referring Physician: Caryl Bis, ERIC Treating Physician/Extender: Frann Rider in Treatment: 9 Active Inactive Orientation to the Wound Care Program Nursing Diagnoses: Knowledge deficit related to the wound healing center program Goals: Patient/caregiver will verbalize understanding of the [place] Date Initiated: 12/31/2015 Goal Status: Active Interventions: Provide education on orientation to the wound center Notes: Pressure Nursing Diagnoses: Knowledge deficit related to causes and risk factors for pressure ulcer development Knowledge deficit related to management of pressures ulcers Potential for impaired tissue integrity related to pressure, friction, moisture, and shear Goals: Patient will remain free from development of additional pressure ulcers Date Initiated: 12/31/2015 Goal Status: Active Patient will remain free of  pressure ulcers Date Initiated: 12/31/2015 Goal Status: Active Patient/caregiver will verbalize risk factors for pressure ulcer development Date Initiated: 12/31/2015 Goal Status: Active Patient/caregiver will verbalize understanding of pressure ulcer management Date Initiated: 12/31/2015 Goal Status: Active Interventions: [name], [name] ([id number]) Assess: immobility, friction, shearing, incontinence upon admission and as needed Assess offloading mechanisms upon admission and as needed Assess potential for pressure ulcer upon admission and as needed Provide education on pressure ulcers Treatment Activities: Patient referred for pressure reduction/relief devices : 12/31/2015 Notes: Wound/Skin Impairment Nursing Diagnoses: Impaired tissue integrity Knowledge deficit related to smoking impact on wound healing Knowledge deficit related to ulceration/compromised skin integrity Goals: Patient/caregiver will verbalize understanding of skin care regimen Date Initiated: 12/31/2015 Goal Status: Active Ulcer/skin breakdown will have a volume reduction of 30% by week 4 Date Initiated: 12/31/2015 Goal Status: Active Ulcer/skin breakdown will have a volume reduction of 50% by week 8 Date Initiated: 12/31/2015 Goal Status: Active Ulcer/skin breakdown will have a volume reduction of 80% by week 12 Date Initiated: 12/31/2015 Goal Status: Active Ulcer/skin breakdown will heal within 14 weeks Date Initiated: 12/31/2015 Goal Status: Active  Interventions: Assess patient/caregiver ability to obtain necessary supplies Assess patient/caregiver ability to perform ulcer/skin care regimen upon admission and as needed Assess ulceration(s) every visit Provide education on ulcer and skin care Treatment Activities: Skin care regimen initiated : 12/31/2015 Topical wound management initiated : 12/31/2015 Notes: [name], [name] ([id number]) Electronic Signature(s) Signed: 03/04/2016 3:57:28 PM By: Regan Lemming BSN, RN Entered By: Regan Lemming on 03/04/2016 10:49:01 [name] ([id number]) -------------------------------------------------------------------------------- Pain Assessment Details Patient Name: [name] Date of Service: 03/04/2016 11:00 AM Medical Record Number: [id number] Patient Account Number: [id number] Date of Birth/Sex: [birth year] (70 y.o. Female) Treating RN: Afful, RN, BSN, Velva Harman Primary Care Physician: Caryl Bis, ERIC Other Clinician: Referring Physician: Caryl Bis, ERIC Treating Physician/Extender: Frann Rider in Treatment: 9 Active Problems Location of Pain Severity and Description of Pain Patient Has Paino No Site Locations With Dressing Change: No Pain Management and Medication Current Pain Management: Electronic Signature(s) Signed: 03/04/2016 3:57:28 PM By: Regan Lemming BSN, RN Entered By: Regan Lemming on 03/04/2016 10:42:22 [name] ([id number]) -------------------------------------------------------------------------------- Patient/Caregiver Education Details Patient Name: [name] Date of Service: 03/04/2016 11:00 AM Medical Record Number: [id number] Patient Account Number: [id number] Date of Birth/Gender: January 13, 1946 (70 y.o. Female) Treating RN: Baruch Gouty, RN, BSN, Velva Harman Primary Care Physician: Caryl Bis, ERIC Other Clinician: Referring Physician: Caryl Bis, ERIC Treating Physician/Extender: Frann Rider in Treatment: 9 Education Assessment Education Provided To: Patient Education Topics Provided Pressure: Methods: Explain/Verbal Responses: State content correctly Welcome To [facility]: Methods: Explain/Verbal Responses: State content correctly Wound/Skin Impairment: Methods: Explain/Verbal Responses: State content correctly Electronic Signature(s) Signed: 03/04/2016 3:57:28 PM By: Regan Lemming BSN, RN Entered By: Regan Lemming on 03/04/2016 11:25:54 [name]  ([id number]) -------------------------------------------------------------------------------- Wound Assessment Details Patient Name: [name] Date of Service: 03/04/2016 11:00 AM Medical Record Number: [id number] Patient Account Number: [id number] Date of Birth/Sex: 1946/02/15 (70 y.o. Female) Treating RN: Afful, RN, BSN, [place] Primary Care Physician: Caryl Bis, ERIC Other Clinician: Referring Physician: Caryl Bis, ERIC Treating Physician/Extender: Frann Rider in Treatment: 9 Wound Status Wound Number: 2 Primary Pressure Ulcer Etiology: Wound Location: Left Calcaneus Secondary Diabetic Wound/Ulcer of the Lower Wounding Event: Pressure Injury Etiology: Extremity Date Acquired: 07/15/2015 Wound Open Weeks Of Treatment: 9 Status: Clustered Wound: No Comorbid Cataracts, Type I Diabetes, History: Rheumatoid Arthritis, Osteoarthritis, Neuropathy, Received Chemotherapy Photos Photo Uploaded By: Regan Lemming on 03/04/2016 14:43:04 Wound Measurements Length: (cm) 0.5 Width: (cm) 0.5 Depth: (cm) 0.8 Area: (cm) 0.196 Volume: (cm) 0.157 % Reduction in Area: 16.9% % Reduction in Volume: -67% Epithelialization: None Tunneling: No Undermining: No Wound Description Classification: Category/Stage III Foul Odor A Diabetic Severity (Wagner): Grade 1 Wound Margin: Flat and Intact Exudate Amount: Medium Exudate Type: Serosanguineous Exudate Color: red, brown fter Cleansing: No Wound Bed Granulation Amount: Medium (34-66%) Exposed Structure Granulation Quality: Pink, Pale Fascia Exposed: No Necrotic Amount: Small (1-33%) Fat Layer Exposed: No [name], [name] ([id number]) Necrotic Quality: Adherent Slough Tendon Exposed: No Muscle Exposed: No Joint Exposed: No Bone Exposed: No Limited to Skin Breakdown Periwound Skin Texture Texture Color No Abnormalities Noted: No No Abnormalities Noted: No Callus: No Atrophie Blanche: No Crepitus: No Cyanosis:  No Excoriation: No Ecchymosis: No Fluctuance: No Erythema: No Friable: No Hemosiderin Staining: No Induration: No Mottled: No Localized Edema: Yes Pallor: No Rash: No Rubor: No Scarring: No Temperature / Pain Moisture Temperature: No Abnormality No Abnormalities Noted: No Tenderness on Palpation: Yes Dry / Scaly: No Maceration: No Moist: Yes Wound Preparation Ulcer Cleansing: Rinsed/Irrigated with Saline Topical Anesthetic  Applied: Other: lidocaine 4%, Treatment Notes Wound #2 (Left Calcaneus) 1. Cleansed with: Clean wound with Normal Saline 4. Dressing Applied: Prisma Ag 5. Secondary Dressing Applied Bordered Foam Dressing Electronic Signature(s) Signed: 03/04/2016 3:57:28 PM By: Regan Lemming BSN, RN Entered By: Regan Lemming on 03/04/2016 10:48:56 [name] ([id number]) -------------------------------------------------------------------------------- [place] Details Patient Name: [name] Date of Service: 03/04/2016 11:00 AM Medical Record Number: [id number] Patient Account Number: [id number] Date of Birth/Sex: 1946-04-20 (70 y.o. Female) Treating RN: Afful, RN, BSN, [place] Primary Care Physician: Caryl Bis, ERIC Other Clinician: Referring Physician: Caryl Bis, ERIC Treating Physician/Extender: Frann Rider in Treatment: 9 Vital Signs Time Taken: 10:42 Temperature (F): 98.3 Height (in): 63 Pulse (bpm): 75 Weight (lbs): 130 Respiratory Rate (breaths/min): 16 Body Mass Index (BMI): 23 Blood Pressure (mmHg): 123/58 Reference Range: 80 - 120 mg / dl Electronic Signature(s) Signed: 03/04/2016 3:57:28 PM By: Regan Lemming BSN, RN Entered By: Regan Lemming on 03/04/2016 10:42:55

## 2016-03-07 ENCOUNTER — Inpatient Hospital Stay: Payer: Medicare Other

## 2016-03-07 ENCOUNTER — Inpatient Hospital Stay: Payer: Medicare Other | Attending: Hematology and Oncology | Admitting: Hematology and Oncology

## 2016-03-07 ENCOUNTER — Telehealth: Payer: Self-pay | Admitting: *Deleted

## 2016-03-07 VITALS — BP 133/76 | HR 83 | Temp 98.0°F | Wt 138.2 lb

## 2016-03-07 DIAGNOSIS — N644 Mastodynia: Secondary | ICD-10-CM | POA: Insufficient documentation

## 2016-03-07 DIAGNOSIS — Z794 Long term (current) use of insulin: Secondary | ICD-10-CM | POA: Insufficient documentation

## 2016-03-07 DIAGNOSIS — K8020 Calculus of gallbladder without cholecystitis without obstruction: Secondary | ICD-10-CM | POA: Insufficient documentation

## 2016-03-07 DIAGNOSIS — Z9641 Presence of insulin pump (external) (internal): Secondary | ICD-10-CM | POA: Insufficient documentation

## 2016-03-07 DIAGNOSIS — R188 Other ascites: Secondary | ICD-10-CM | POA: Insufficient documentation

## 2016-03-07 DIAGNOSIS — C787 Secondary malignant neoplasm of liver and intrahepatic bile duct: Secondary | ICD-10-CM | POA: Insufficient documentation

## 2016-03-07 DIAGNOSIS — R109 Unspecified abdominal pain: Secondary | ICD-10-CM | POA: Diagnosis not present

## 2016-03-07 DIAGNOSIS — Z7901 Long term (current) use of anticoagulants: Secondary | ICD-10-CM

## 2016-03-07 DIAGNOSIS — E785 Hyperlipidemia, unspecified: Secondary | ICD-10-CM | POA: Diagnosis not present

## 2016-03-07 DIAGNOSIS — R232 Flushing: Secondary | ICD-10-CM | POA: Diagnosis not present

## 2016-03-07 DIAGNOSIS — K222 Esophageal obstruction: Secondary | ICD-10-CM | POA: Diagnosis not present

## 2016-03-07 DIAGNOSIS — M069 Rheumatoid arthritis, unspecified: Secondary | ICD-10-CM | POA: Diagnosis not present

## 2016-03-07 DIAGNOSIS — Z87891 Personal history of nicotine dependence: Secondary | ICD-10-CM | POA: Insufficient documentation

## 2016-03-07 DIAGNOSIS — Z9049 Acquired absence of other specified parts of digestive tract: Secondary | ICD-10-CM

## 2016-03-07 DIAGNOSIS — I2510 Atherosclerotic heart disease of native coronary artery without angina pectoris: Secondary | ICD-10-CM | POA: Insufficient documentation

## 2016-03-07 DIAGNOSIS — R1012 Left upper quadrant pain: Secondary | ICD-10-CM

## 2016-03-07 DIAGNOSIS — I5032 Chronic diastolic (congestive) heart failure: Secondary | ICD-10-CM

## 2016-03-07 DIAGNOSIS — Z5111 Encounter for antineoplastic chemotherapy: Secondary | ICD-10-CM | POA: Insufficient documentation

## 2016-03-07 DIAGNOSIS — I509 Heart failure, unspecified: Secondary | ICD-10-CM | POA: Insufficient documentation

## 2016-03-07 DIAGNOSIS — N1330 Unspecified hydronephrosis: Secondary | ICD-10-CM | POA: Insufficient documentation

## 2016-03-07 DIAGNOSIS — Z79810 Long term (current) use of selective estrogen receptor modulators (SERMs): Secondary | ICD-10-CM | POA: Insufficient documentation

## 2016-03-07 DIAGNOSIS — Z90722 Acquired absence of ovaries, bilateral: Secondary | ICD-10-CM

## 2016-03-07 DIAGNOSIS — M359 Systemic involvement of connective tissue, unspecified: Secondary | ICD-10-CM | POA: Insufficient documentation

## 2016-03-07 DIAGNOSIS — M549 Dorsalgia, unspecified: Secondary | ICD-10-CM

## 2016-03-07 DIAGNOSIS — K449 Diaphragmatic hernia without obstruction or gangrene: Secondary | ICD-10-CM | POA: Insufficient documentation

## 2016-03-07 DIAGNOSIS — D72819 Decreased white blood cell count, unspecified: Secondary | ICD-10-CM | POA: Insufficient documentation

## 2016-03-07 DIAGNOSIS — I10 Essential (primary) hypertension: Secondary | ICD-10-CM | POA: Diagnosis not present

## 2016-03-07 DIAGNOSIS — Z90710 Acquired absence of both cervix and uterus: Secondary | ICD-10-CM | POA: Insufficient documentation

## 2016-03-07 DIAGNOSIS — C786 Secondary malignant neoplasm of retroperitoneum and peritoneum: Secondary | ICD-10-CM | POA: Diagnosis not present

## 2016-03-07 DIAGNOSIS — C7951 Secondary malignant neoplasm of bone: Secondary | ICD-10-CM | POA: Diagnosis not present

## 2016-03-07 DIAGNOSIS — K219 Gastro-esophageal reflux disease without esophagitis: Secondary | ICD-10-CM | POA: Insufficient documentation

## 2016-03-07 DIAGNOSIS — Z808 Family history of malignant neoplasm of other organs or systems: Secondary | ICD-10-CM

## 2016-03-07 DIAGNOSIS — D61818 Other pancytopenia: Secondary | ICD-10-CM | POA: Diagnosis not present

## 2016-03-07 DIAGNOSIS — I252 Old myocardial infarction: Secondary | ICD-10-CM

## 2016-03-07 DIAGNOSIS — Z8701 Personal history of pneumonia (recurrent): Secondary | ICD-10-CM | POA: Insufficient documentation

## 2016-03-07 DIAGNOSIS — C569 Malignant neoplasm of unspecified ovary: Secondary | ICD-10-CM

## 2016-03-07 DIAGNOSIS — Z7689 Persons encountering health services in other specified circumstances: Secondary | ICD-10-CM

## 2016-03-07 DIAGNOSIS — C561 Malignant neoplasm of right ovary: Secondary | ICD-10-CM | POA: Diagnosis not present

## 2016-03-07 DIAGNOSIS — E108 Type 1 diabetes mellitus with unspecified complications: Secondary | ICD-10-CM | POA: Insufficient documentation

## 2016-03-07 DIAGNOSIS — R0602 Shortness of breath: Secondary | ICD-10-CM | POA: Diagnosis not present

## 2016-03-07 DIAGNOSIS — C801 Malignant (primary) neoplasm, unspecified: Secondary | ICD-10-CM

## 2016-03-07 DIAGNOSIS — E876 Hypokalemia: Secondary | ICD-10-CM

## 2016-03-07 DIAGNOSIS — I82C19 Acute embolism and thrombosis of unspecified internal jugular vein: Secondary | ICD-10-CM | POA: Insufficient documentation

## 2016-03-07 DIAGNOSIS — Z9221 Personal history of antineoplastic chemotherapy: Secondary | ICD-10-CM | POA: Insufficient documentation

## 2016-03-07 LAB — COMPREHENSIVE METABOLIC PANEL
ALT: 28 U/L (ref 14–54)
AST: 16 U/L (ref 15–41)
Albumin: 3.3 g/dL — ABNORMAL LOW (ref 3.5–5.0)
Alkaline Phosphatase: 108 U/L (ref 38–126)
Anion gap: 7 (ref 5–15)
BUN: 24 mg/dL — ABNORMAL HIGH (ref 6–20)
CO2: 27 mmol/L (ref 22–32)
Calcium: 9.1 mg/dL (ref 8.9–10.3)
Chloride: 98 mmol/L — ABNORMAL LOW (ref 101–111)
Creatinine, Ser: 0.73 mg/dL (ref 0.44–1.00)
GFR calc Af Amer: >60 mL/min (ref >60)
GFR calc non Af Amer: >60 mL/min (ref >60)
Glucose, Bld: 219 mg/dL — ABNORMAL HIGH (ref 65–99)
Potassium: 4.9 mmol/L (ref 3.5–5.1)
Sodium: 132 mmol/L — ABNORMAL LOW (ref 135–145)
Total Bilirubin: <0.1 mg/dL — ABNORMAL LOW (ref 0.3–1.2)
Total Protein: 7.4 g/dL (ref 6.5–8.1)

## 2016-03-07 LAB — CBC WITH DIFFERENTIAL/PLATELET
Basophils Absolute: 0 10*3/uL (ref 0–0.1)
Basophils Relative: 1 %
Eosinophils Absolute: 0 10*3/uL (ref 0–0.7)
Eosinophils Relative: 0 %
HCT: 32.9 % — ABNORMAL LOW (ref 35.0–47.0)
Hemoglobin: 11.6 g/dL — ABNORMAL LOW (ref 12.0–16.0)
Lymphocytes Relative: 26 %
Lymphs Abs: 0.7 10*3/uL — ABNORMAL LOW (ref 1.0–3.6)
MCH: 30.1 pg (ref 26.0–34.0)
MCHC: 35.3 g/dL (ref 32.0–36.0)
MCV: 85.3 fL (ref 80.0–100.0)
Monocytes Absolute: 0.4 10*3/uL (ref 0.2–0.9)
Monocytes Relative: 15 %
Neutro Abs: 1.6 10*3/uL (ref 1.4–6.5)
Neutrophils Relative %: 58 %
Platelets: 198 10*3/uL (ref 150–440)
RBC: 3.86 MIL/uL (ref 3.80–5.20)
RDW: 15.6 % — ABNORMAL HIGH (ref 11.5–14.5)
WBC: 2.7 10*3/uL — ABNORMAL LOW (ref 3.6–11.0)

## 2016-03-07 LAB — URINALYSIS COMPLETE WITH MICROSCOPIC (ARMC ONLY)
Bacteria, UA: NONE SEEN
Bilirubin Urine: NEGATIVE
Glucose, UA: >500 mg/dL — AB
Hgb urine dipstick: NEGATIVE
Ketones, ur: NEGATIVE mg/dL
Leukocytes, UA: NEGATIVE
Nitrite: NEGATIVE
Protein, ur: NEGATIVE mg/dL
Specific Gravity, Urine: 1.012 (ref 1.005–1.030)
pH: 6 (ref 5.0–8.0)

## 2016-03-07 NOTE — Telephone Encounter (Signed)
States shew has a worsening pain in her left side from the waist down which she has had for several days now. Asking to be evaluated. Please advise

## 2016-03-07 NOTE — Telephone Encounter (Signed)
Per Dr Mike Gip see patient She agrees with 330 appt today

## 2016-03-07 NOTE — Progress Notes (Signed)
Patient acute add on for pain in her left side since this past Friday.  Patient states the pain is 7/10 when sitting but increases when she moves. Patient has colostomy and bowels are moving fine.  Passing urine okay.  Appetite good.  States when the pain hits she feels SOB.

## 2016-03-08 ENCOUNTER — Telehealth: Payer: Self-pay | Admitting: *Deleted

## 2016-03-08 ENCOUNTER — Ambulatory Visit
Admission: RE | Admit: 2016-03-08 | Discharge: 2016-03-08 | Disposition: A | Discharge status: Home / Self Care | Payer: Medicare Other | Source: Ambulatory Visit | Attending: Hematology and Oncology | Admitting: Hematology and Oncology

## 2016-03-08 DIAGNOSIS — C801 Malignant (primary) neoplasm, unspecified: Secondary | ICD-10-CM

## 2016-03-08 DIAGNOSIS — C786 Secondary malignant neoplasm of retroperitoneum and peritoneum: Secondary | ICD-10-CM | POA: Diagnosis not present

## 2016-03-08 DIAGNOSIS — C787 Secondary malignant neoplasm of liver and intrahepatic bile duct: Secondary | ICD-10-CM | POA: Diagnosis not present

## 2016-03-08 DIAGNOSIS — R1012 Left upper quadrant pain: Secondary | ICD-10-CM | POA: Diagnosis not present

## 2016-03-08 DIAGNOSIS — C569 Malignant neoplasm of unspecified ovary: Secondary | ICD-10-CM

## 2016-03-08 LAB — URINE CULTURE: Culture: <10000 — AB

## 2016-03-08 LAB — CA 125: CA 125: 138.8 U/mL — ABNORMAL HIGH (ref 0.0–38.1)

## 2016-03-08 MED ORDER — IOPAMIDOL (ISOVUE-300) INJECTION 61%: 100.0000 mL | Freq: Once | INTRAVENOUS | Status: DC | PRN

## 2016-03-08 NOTE — Telephone Encounter (Signed)
Called patient to inform her that her CT shows mildly progressive disease.  MD recommends starting chemo sooner.  Also will speak with radiologist.  We will be back in touch with patient once everything is in motion.  Patient verbalized understanding and is in agreement with this plan.

## 2016-03-08 NOTE — Telephone Encounter (Signed)
-----   Message from Lequita Asal, MD sent at 03/08/2016  1:13 PM EST ----- Regarding: Please call patient  Scans show mildly progressive disease.  Will review with radiology.  Will need to start chemo sooner.  M  ----- Message ----- From: Interface, Rad Results In Sent: 03/08/2016  11:33 AM To: Lequita Asal, MD

## 2016-03-10 ENCOUNTER — Ambulatory Visit: Payer: Medicare Other | Admitting: Family Medicine

## 2016-03-11 ENCOUNTER — Encounter: Payer: Medicare Other | Admitting: Surgery

## 2016-03-11 DIAGNOSIS — E10621 Type 1 diabetes mellitus with foot ulcer: Secondary | ICD-10-CM | POA: Diagnosis not present

## 2016-03-12 NOTE — Progress Notes (Signed)
VENIE, GUNNELL ([id number]) Visit Report for 03/11/2016 Chief Complaint Document Details Patient Name: [name], [name] 03/11/2016 2:15 Date of Service: PM Medical Record [id number] Number: Patient Account Number: [id number] [birth year] (70 y.o. Treating RN: Janet Gouty, RN, BSN, [name] Date of Birth/Sex: Female) Other Clinician: Primary Care Physician: [name], [name] Treating [name] Referring Physician: Caryl [name], [name] Physician/Extender: [name] in Treatment: 10 Information Obtained from: Patient Chief Complaint Patient is at the clinic for treatment of an open pressure ulcer to the left heel which she has for over 5 months Electronic Signature(s) Signed: 03/11/2016 3:02:44 PM By: Janet Fudge MD, FACS Entered By: [name] on 03/11/2016 15:02:44 [name] ([id number]) -------------------------------------------------------------------------------- HPI Details Patient Name: [name], [name] 03/11/2016 2:15 Date of Service: PM Medical Record [id number] Number: Patient Account Number: [id number] May 21, 1945 (70 y.o. Treating RN: Afful, RN, BSN, [name] Date of Birth/Sex: Female) Other Clinician: Primary Care Physician: [name], [name] Treating [name] Referring Physician: Caryl [name], [name] Physician/Extender: [name] in Treatment: 10 History of Present Illness Location: ulcerated on the right heel Quality: Patient reports experiencing a dull [name] to affected area(s). Severity: Patient states wound are getting worse. Duration: Patient has had the wound for <5 months prior to presenting for treatment Timing: [name] in wound is Intermittent (comes and goes Context: The wound appeared gradually over time Modifying Factors: Consults to this date include: local care was done as per the medication given by the PCP which may have been medihoney Associated Signs and Symptoms: Patient reports having difficulty standing for long periods. 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 In the past she was seen for a decubitus ulcer on her right gluteal area. She is known to have diabetes mellitus type 1 and her most recent A1c was 7.2% Past medical history significant for leukopenia, cholelithiasis, hypertension, chronic diastolic CHF, coronary artery disease,type 1 diabetes mellitus, rheumatoid arthritis, collagen vascular disease, ovarian cancer, status post CABG in 2014 and status post Port-A-Cath insertion by Dr. Leotis [name] in October 2016. She has quit smoking about 20 years ago. She was advised to use Medihoney with calcium alginate pads to be applied over the wound. 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 01/07/2016 -- x-ray of the left heel done -- IMPRESSION: No radiographic evidence of osteomyelitis. If this remains a clinical concern, recommend MR imaging. 02/05/16: returns today for f/u. denies fever, chills, body aches or malaise. no interval changes regarding health status. 02/12/16: pt is doing well. wound has improved. no s/s of infection. no systemic s/s of infection. She reports will be on vacation next week, and requests appointment in 2 [name]. Electronic Signature(s) Signed: 03/11/2016 3:02:47 PM By: Janet Fudge MD, FACS Entered By: [name] on 03/11/2016 15:02:47 [name], [name] ([name], [name] ([id number]) -------------------------------------------------------------------------------- Physical Exam Details Patient  Name: [name], [name] 03/11/2016 2:15 Date of Service: PM Medical Record [id number] Number: Patient Account Number: [id number] [birth year] (70 y.o. Treating RN: Janet Gouty, RN, BSN, [name] Date of Birth/Sex: Female) Other Clinician: Primary Care Physician: [name], [name] Treating [name],  [name] Referring Physician: Caryl [name], [name] Physician/Extender: [name] in Treatment: 10 Constitutional . Pulse regular. Respirations normal and unlabored. Afebrile. . Eyes Nonicteric. Reactive to light. Ears, Nose, Mouth, and Throat Lips, teeth, and gums WNL.Marland Kitchen Moist mucosa without lesions. Neck supple and nontender. No palpable supraclavicular or cervical adenopathy. Normal sized without goiter. Respiratory WNL. No retractions.. Breath sounds WNL, No rubs, rales, rhonchi, or wheeze.. Cardiovascular Heart rhythm and rate regular, no murmur or gallop.. Pedal Pulses WNL. No clubbing, cyanosis or edema. Lymphatic No adneopathy. No adenopathy. No adenopathy. Musculoskeletal Adexa without tenderness or enlargement.. Digits and nails w/o clubbing, cyanosis, infection, petechiae, ischemia, or inflammatory conditions.. Integumentary (Hair, Skin) No suspicious lesions. No crepitus or fluctuance. No peri-wound warmth or erythema. No masses.Marland Kitchen Psychiatric Judgement and insight Intact.. No evidence of depression, anxiety, or agitation.. Notes I was able to flush out the wound nicely and using a moist Q-tip removed most of the debris and there was no need sharp debridement. Electronic Signature(s) Signed: 03/11/2016 3:03:18 PM By: Janet Fudge MD, FACS Entered By: [name] on 03/11/2016 15:03:17 [name] ([id number]) -------------------------------------------------------------------------------- Physician Orders Details Patient Name: [name], [name] 03/11/2016 2:15 Date of Service: PM Medical Record [id number] Number: Patient Account Number: [id number] 1945-08-18 (70 y.o. Treating RN:  Afful, RN, BSN, [name] Date of Birth/Sex: Female) Other Clinician: Primary Care Physician: [name], [name] Treating Jachob Mcclean Referring Physician: Caryl [name], [name] Physician/Extender: [name] in Treatment: 10 Verbal / Phone Orders: Yes Clinician: Afful, RN, BSN, Rita Read Back and Verified: Yes Diagnosis Coding Wound Cleansing Wound #2 Left Calcaneus o Cleanse wound with mild soap and water o May Shower, gently pat wound dry prior to applying new dressing. Anesthetic Wound #2 Left Calcaneus o Topical Lidocaine 4% cream applied to wound bed prior to debridement Skin Barriers/Peri-Wound Care Wound #2 Left Calcaneus o Skin Prep Primary Wound Dressing Wound #2 Left Calcaneus o Prisma Ag Secondary Dressing Wound #2 Left Calcaneus o Boardered Foam Dressing - or Coverllette Bandaid Dressing Change Frequency Wound #2 Left Calcaneus o Change dressing every day. Follow-up Appointments Wound #2 Left Calcaneus o Return Appointment in 1 week. Off-Loading Wound #2 Left Calcaneus o Heel suspension boot to: - ELevate on pillows [name], [name]. ([id number]) Additional Orders / Instructions Wound #2 Left Calcaneus o Increase protein intake. o Activity as tolerated o Other: - INclude Vitamin A, C, ZInc, MVI in diet Electronic Signature(s) Signed: 03/11/2016 3:23:03 PM By: Regan Lemming BSN, RN Signed: 03/11/2016 4:13:44 PM By: Janet Fudge MD, FACS Entered By: Regan Lemming on 03/11/2016 14:22:33 YARROW, REBELLO ([id number]) -------------------------------------------------------------------------------- Problem List Details Patient Name: [name], [name] 03/11/2016 2:15 Date of Service: PM Medical Record [id number] Number: Patient Account Number: [id number] 1945-09-27 (70 y.o. Treating RN: Janet Gouty, RN, BSN, [name] Date of Birth/Sex: Female) Other Clinician: Primary Care Physician: [name], [name] Treating [name] Referring Physician: Caryl [name],  [name] Physician/Extender: [name] in Treatment: 10 Active Problems ICD-10 Encounter Code Description Active Date Diagnosis E10.621 Type 1 diabetes mellitus with foot ulcer 12/31/2015 Yes L89.623 Pressure ulcer of left heel, stage 3 12/31/2015 Yes Z92.21 Personal history of antineoplastic chemotherapy 12/31/2015 Yes Z79.52 Long term (current) use of systemic steroids 12/31/2015 Yes Inactive Problems Resolved Problems Electronic Signature(s) Signed: 03/11/2016 3:02:35 PM By: Janet Fudge MD, FACS Entered By: [name] on 03/11/2016 15:02:35 [name] ([id number]) -------------------------------------------------------------------------------- Progress Note Details Patient Name: [name]. 03/11/2016 2:15 Date of Service: PM Medical Record [id number] Number: Patient Account Number: [id number] 07/18/1945 (70 y.o. Treating RN: Janet Gouty, RN, BSN, [name] Date of  Birth/Sex: Female) Other Clinician: Primary Care Physician: [name], [name] Treating Abdalrahman Clementson Referring Physician: Caryl [name], [name] Physician/Extender: [name] in Treatment: 10 Subjective Chief Complaint Information obtained from Patient Patient is at the clinic for treatment of an open pressure ulcer to the left heel which she has for over 5 months History of Present Illness (HPI) The following HPI elements were documented for the patient's wound: Location: ulcerated on the right heel Quality: Patient reports experiencing a dull [name] to affected area(s). Severity: Patient states wound are getting worse. Duration: Patient has had the wound for Timing: [name] in wound is Intermittent (comes and goes Context: The wound appeared gradually over time Modifying Factors: Consults to this date include: local care was done as per the medication given by the PCP which may have been medihoney Associated Signs and Symptoms: Patient reports having difficulty standing for long periods. 70 year old female with bilateral  ovarian masses with associated peritoneal metastasis disease is on chemotherapy seen by as in February of this year and now returns with a left heel ulcer which she's had for about 5 months. In the past she was seen for a decubitus ulcer on her right gluteal area. She is known to have diabetes mellitus type 1 and her most recent A1c was 7.2% Past medical history significant for leukopenia, cholelithiasis, hypertension, chronic diastolic CHF, coronary artery disease,type 1 diabetes mellitus, rheumatoid arthritis, collagen vascular disease, ovarian cancer, status post CABG in 2014 and status post Port-A-Cath insertion by Dr. Leotis [name] in October 2016. She has quit smoking about 20 years ago. She was advised to use Medihoney with calcium alginate pads to be applied over the wound. 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 01/07/2016 -- x-ray of the left heel done -- IMPRESSION: No radiographic evidence of osteomyelitis. If this remains a clinical concern, recommend MR imaging. 02/05/16: returns today for f/u. denies fever, chills, body aches or malaise. no interval changes regarding health status. [name], [name] ([id number]) 02/12/16: pt is doing well. wound has improved. no s/s of infection. no systemic s/s of infection. She reports will be on vacation next week, and requests appointment in 2 [name]. Objective Constitutional Pulse regular. Respirations normal and unlabored. Afebrile. Vitals Time Taken: 2:12 PM, Height: 63 in, Weight: 130 lbs, BMI: 23, Temperature: 97.8 F, Pulse: 75 bpm, Respiratory Rate: 16 breaths/min, Blood Pressure: 111/89 mmHg. Eyes Nonicteric. Reactive to  light. Ears, Nose, Mouth, and Throat Lips, teeth, and gums WNL.Marland Kitchen Moist mucosa without lesions. Neck supple and nontender. No palpable supraclavicular or cervical adenopathy. Normal sized without goiter. Respiratory WNL. No retractions.. Breath sounds WNL, No rubs, rales, rhonchi, or wheeze.. Cardiovascular Heart rhythm and rate regular, no murmur or gallop.. Pedal Pulses WNL. No clubbing, cyanosis or edema. Lymphatic No adneopathy. No adenopathy. No adenopathy. Musculoskeletal Adexa without tenderness or enlargement.. Digits and nails w/o clubbing, cyanosis, infection, petechiae, ischemia, or inflammatory conditions.Marland Kitchen Psychiatric Judgement and insight Intact.. No evidence of depression, anxiety, or agitation.. General Notes: I was able to flush out the wound nicely and using a moist Q-tip removed most of the debris and there was no need sharp debridement. Integumentary (Hair, Skin) No suspicious lesions. No crepitus or fluctuance. No peri-wound warmth or erythema. No masses.Marland Kitchen  Wound #2 status is Open. Original cause of wound was Pressure Injury. The wound is located on the Left [name], [name]. ([id number]) Calcaneus. The wound measures 0.4cm length x 0.4cm width x 0.6cm depth; 0.126cm^2 area and 0.075cm^3 volume. The wound is limited to skin breakdown. There is no tunneling or undermining noted. There is a medium amount of serosanguineous drainage noted. The wound margin is flat and intact. There is medium (34-66%) pink, pale granulation within the wound bed. There is a small (1-33%) amount of necrotic tissue within the wound bed including Adherent Slough. The periwound skin appearance exhibited: Localized Edema, Moist. The periwound skin appearance did not exhibit: Callus, Crepitus, Excoriation, Fluctuance, Friable, Induration, Rash, Scarring, Dry/Scaly, Maceration, Atrophie Blanche, Cyanosis, Ecchymosis, Hemosiderin Staining, Mottled, Pallor, Rubor, Erythema. Periwound temperature was  noted as No Abnormality. The periwound has tenderness on palpation. Assessment Active Problems ICD-10 E10.621 - Type 1 diabetes mellitus with foot ulcer L89.623 - Pressure ulcer of left heel, stage 3 Z92.21 - Personal history of antineoplastic chemotherapy Z79.52 - Long term (current) use of systemic steroids Plan Wound Cleansing: Wound #2 Left Calcaneus: Cleanse wound with mild soap and water May Shower, gently pat wound dry prior to applying new dressing. Anesthetic: Wound #2 Left Calcaneus: Topical Lidocaine 4% cream applied to wound bed prior to debridement Skin Barriers/Peri-Wound Care: Wound #2 Left Calcaneus: Skin Prep Primary Wound Dressing: Wound #2 Left Calcaneus: Prisma Ag Secondary Dressing: Wound #2 Left Calcaneus: Boardered Foam Dressing - or Coverllette Bandaid Dressing Change Frequency: Wound #2 Left Calcaneus: Change dressing every day. Follow-up Appointments: [name], [name] ([id number]) Wound #2 Left Calcaneus: Return Appointment in 1 week. Off-Loading: Wound #2 Left Calcaneus: Heel suspension boot to: - ELevate on pillows Additional Orders / Instructions: Wound #2 Left Calcaneus: Increase protein intake. Activity as tolerated Other: - INclude Vitamin A, C, ZInc, MVI in diet I have recommended packing the wound with Prisma AG on alternate days and placing a bordered foam. Discussion about wound care and how he should do the flushing of the wound has been discussed in detail with the husband was the caregiver She will come back and see me next week Electronic Signature(s) Signed: 03/11/2016 3:03:36 PM By: Janet Fudge MD, FACS Entered By: [name] on 03/11/2016 15:03:35 [name] ([id number]) -------------------------------------------------------------------------------- SuperBill Details Patient Name: [name] Date of Service: 03/11/2016 Medical Record Patient Account Number: [id number] Number: Afful, RN,  BSN, Treating RN: 02-08-1946 (70 y.o. [name] Date of Birth/Sex: Female) Other Clinician: Primary Care Physician: [name], [name] Treating [name] Referring Physician: Caryl [name], [name] Physician/Extender: [name] in Treatment: 10 Diagnosis Coding ICD-10 Codes Code Description E10.621 Type 1 diabetes mellitus with foot ulcer L89.623 Pressure ulcer of left heel, stage 3 Z92.21 Personal history of antineoplastic chemotherapy Z79.52 Long term (current) use of systemic steroids Facility Procedures CPT4 Code: [id number] Description: [id number] - WOUND CARE VISIT-LEV 2 EST PT Modifier: Quantity: 1 Physician Procedures CPT4 Code: [id number] Description: [id number] - WC PHYS LEVEL 3 - EST PT ICD-10 Description Diagnosis E10.621 Type 1 diabetes mellitus with foot ulcer L89.623 Pressure ulcer of left heel, stage 3 Z92.21 Personal history of antineoplastic chemothera Modifier: py Quantity: 1 Electronic Signature(s) Signed: 03/11/2016 3:03:53 PM By: Janet Fudge MD, FACS Entered By: [name] on 03/11/2016 15:03:53

## 2016-03-12 NOTE — Progress Notes (Signed)
SENIQUA, GILLIARD ([id number]) Visit Report for 03/11/2016 Arrival Information Details Patient Name: [name], [name] Date of Service: 03/11/2016 2:15 PM Medical Record Number: [id number] Patient Account Number: [id number] Date of Birth/Sex: 01/18/1946 (70 y.o. Female) Treating [name]: [name], [name], [name] Primary Care Physician: [name] Other Clinician: Referring Physician: Caryl Bis, [name] Treating Physician/Extender: Frann Rider in Treatment: 10 Visit Information History Since Last Visit All ordered tests and consults were completed: No Patient Arrived: [name] Added or deleted any medications: No Arrival Time: 14:09 Any new allergies or adverse reactions: No Accompanied By: hubby Had a fall or experienced change in No Transfer Assistance: None activities of daily living that may affect Patient Identification Verified: Yes risk of falls: Secondary Verification Process Yes Signs or symptoms of abuse/neglect since last No Completed: visito Patient Requires Transmission- No Hospitalized since last visit: No Based Precautions: Has Dressing in Place as Prescribed: Yes Patient Has Alerts: Yes Pain Present Now: No Patient Alerts: Patient on Blood Thinner lovenox BID DMII Electronic Signature(s) Signed: 03/11/2016 3:23:03 PM By: [name] BSN, [name] Entered By: [name] on 03/11/2016 14:10:37 [name] ([id number]) -------------------------------------------------------------------------------- Clinic Level of Care Assessment Details Patient Name: [name] Date of Service: 03/11/2016 2:15 PM Medical Record Number: [id number] Patient Account Number: [id number] Date of Birth/Sex: Jan 13, 1946 (70 y.o. Female) Treating [name]: [name], [name], [name] Primary Care Physician: [name] Other Clinician: Referring Physician: Caryl Bis, [name] Treating Physician/Extender: Frann Rider in Treatment: 10 Clinic Level of Care Assessment Items TOOL 4  Quantity Score []  - Use when only an EandM is performed on FOLLOW-UP visit 0 ASSESSMENTS - Nursing Assessment / Reassessment X - Reassessment of Co-morbidities (includes updates in patient status) 1 10 X - Reassessment of Adherence to Treatment Plan 1 5 ASSESSMENTS - Wound and Skin Assessment / Reassessment X - Simple Wound Assessment / Reassessment - one wound 1 5 []  - Complex Wound Assessment / Reassessment - multiple wounds 0 []  - Dermatologic / Skin Assessment (not related to wound area) 0 ASSESSMENTS - Focused Assessment []  - Circumferential Edema Measurements - multi extremities 0 []  - Nutritional Assessment / Counseling / Intervention 0 X - Lower Extremity Assessment (monofilament, tuning fork, pulses) 1 5 []  - Peripheral Arterial Disease Assessment (using hand held doppler) 0 ASSESSMENTS - Ostomy and/or Continence Assessment and Care []  - Incontinence Assessment and Management 0 []  - Ostomy Care Assessment and Management (repouching, etc.) 0 PROCESS - Coordination of Care X - Simple Patient / Family Education for ongoing care 1 15 []  - Complex (extensive) Patient / Family Education for ongoing care 0 []  - Staff obtains Programmer, systems, Records, Test Results / Process Orders 0 []  - Staff telephones HHA, Nursing Homes / Clarify orders / etc 0 []  - Routine Transfer to another Facility (non-emergent condition) 0 [name]. ([id number]) []  - Routine Hospital Admission (non-emergent condition) 0 []  - New Admissions / Biomedical engineer / Ordering NPWT, Apligraf, etc. 0 []  - Emergency Hospital Admission (emergent condition) 0 []  - Simple Discharge Coordination 0 []  - Complex (extensive) Discharge Coordination 0 PROCESS - Special Needs []  - Pediatric / Minor Patient Management 0 []  - Isolation Patient Management 0 []  - Hearing / Language / Visual special needs 0 []  - Assessment of Community assistance (transportation, D/C planning, etc.) 0 []  - Additional assistance / Altered  mentation 0 []  - Support Surface(s) Assessment (bed, cushion, seat, etc.) 0 INTERVENTIONS - Wound Cleansing / Measurement X - Simple Wound Cleansing - one wound 1 5 []  -  Complex Wound Cleansing - multiple wounds 0 X - Wound Imaging (photographs - any number of wounds) 1 5 []  - Wound Tracing (instead of photographs) 0 X - Simple Wound Measurement - one wound 1 5 []  - Complex Wound Measurement - multiple wounds 0 INTERVENTIONS - Wound Dressings X - Small Wound Dressing one or multiple wounds 1 10 []  - Medium Wound Dressing one or multiple wounds 0 []  - Large Wound Dressing one or multiple wounds 0 []  - Application of Medications - topical 0 []  - Application of Medications - injection 0 INTERVENTIONS - Miscellaneous []  - External ear exam 0 [name]. ([id number]) []  - Specimen Collection (cultures, biopsies, blood, body fluids, etc.) 0 []  - Specimen(s) / Culture(s) sent or taken to Lab for analysis 0 []  - Patient Transfer (multiple staff / Harrel Lemon Lift / Similar devices) 0 []  - Simple Staple / Suture removal (25 or less) 0 []  - Complex Staple / Suture removal (26 or more) 0 []  - Hypo / Hyperglycemic Management (close monitor of Blood Glucose) 0 []  - Ankle / Brachial Index (ABI) - do not check if billed separately 0 X - Vital Signs 1 5 Has the patient been seen at the hospital within the last three years: Yes Total Score: 70 Level Of Care: New/Established - Level 2 Electronic Signature(s) Signed: 03/11/2016 3:23:03 PM By: [name] BSN, [name] Entered By: [name] on 03/11/2016 14:26:33 [name] ([id number]) -------------------------------------------------------------------------------- Encounter Discharge Information Details Patient Name: [name] Date of Service: 03/11/2016 2:15 PM Medical Record Number: [id number] Patient Account Number: [id number] Date of Birth/Sex: 28-Dec-1945 (70 y.o. Female) Treating [name]: [name], [name], [name], [name] Primary Care Physician:  [name] Other Clinician: Referring Physician: Caryl Bis, [name] Treating Physician/Extender: Frann Rider in Treatment: 10 Encounter Discharge Information Items Discharge Pain Level: 0 Discharge Condition: Stable Ambulatory Status: Cane Discharge Destination: Home Transportation: Private Auto Accompanied By: [name] Schedule Follow-up Appointment: No Medication Reconciliation completed and provided to Patient/Care No Laterria [place]: Provided on Clinical Summary of Care: 03/11/2016 Form Type Recipient Paper Patient SR Electronic Signature(s) Signed: 03/11/2016 3:23:03 PM By: [name] BSN, [name] Previous Signature: 03/11/2016 2:26:45 PM Version By: Ruthine Dose Entered By: [name] on 03/11/2016 14:27:16 [name] ([id number]) -------------------------------------------------------------------------------- Lower Extremity Assessment Details Patient Name: [name] Date of Service: 03/11/2016 2:15 PM Medical Record Number: [id number] Patient Account Number: [id number] Date of Birth/Sex: 1946/01/26 (70 y.o. Female) Treating [name]: [name], [name], [name] Primary Care Physician: [name] Other Clinician: Referring Physician: Caryl Bis, [name] Treating Physician/Extender: Frann Rider in Treatment: 10 Vascular Assessment Pulses: Posterior Tibial Dorsalis Pedis Palpable: [Left:Yes] Extremity colors, hair growth, and conditions: Extremity Color: [Left:Normal] Hair Growth on Extremity: [Left:No] Temperature of Extremity: [Left:Warm] Capillary Refill: [Left:< 3 seconds] Electronic Signature(s) Signed: 03/11/2016 3:23:03 PM By: [name] BSN, [name] Entered By: [name] on 03/11/2016 14:12:25 [name] ([id number]) -------------------------------------------------------------------------------- Multi Wound Chart Details Patient Name: [name] Date of Service: 03/11/2016 2:15 PM Medical Record Number: [id number] Patient  Account Number: [id number] Date of Birth/Sex: [birth year] (70 y.o. Female) Treating [name]: [name], [name], [name], [name] Primary Care Physician: [name] Other Clinician: Referring Physician: Caryl Bis, [name] Treating Physician/Extender: Frann Rider in Treatment: 10 Vital Signs Height(in): 63 Pulse(bpm): 75 Weight(lbs): 130 Blood Pressure 111/89 (mmHg): Body Mass Index(BMI): 23 Temperature(F): 97.8 Respiratory Rate 16 (breaths/min): Photos: [2:No Photos] [N/A:N/A] Wound Location: [2:Left Calcaneus] [N/A:N/A] Wounding Event: [2:Pressure Injury] [N/A:N/A] Primary Etiology: [2:Pressure Ulcer] [N/A:N/A] Secondary Etiology: [2:Diabetic Wound/Ulcer of the Lower  Extremity] [N/A:N/A] Comorbid History: [2:Cataracts, Type I Diabetes, Rheumatoid Arthritis, Osteoarthritis, Neuropathy, Received Chemotherapy] [N/A:N/A] Date Acquired: [2:07/15/2015] [N/A:N/A] Weeks of Treatment: [2:10] [N/A:N/A] Wound Status: [2:Open] [N/A:N/A] Measurements L x W x D 0.4x0.4x0.6 [N/A:N/A] (cm) Area (cm) : [2:0.126] [N/A:N/A] Volume (cm) : [2:0.075] [N/A:N/A] % Reduction in Area: [2:46.60%] [N/A:N/A] % Reduction in Volume: 20.20% [N/A:N/A] Classification: [2:Category/Stage III] [N/A:N/A] HBO Classification: [2:Grade 1] [N/A:N/A] Exudate Amount: [2:Medium] [N/A:N/A] Exudate Type: [2:Serosanguineous] [N/A:N/A] Exudate Color: [2:red, brown] [N/A:N/A] Wound Margin: [2:Flat and Intact] [N/A:N/A] Granulation Amount: [2:Medium (34-66%)] [N/A:N/A] Granulation Quality: [2:Pink, Pale] [N/A:N/A] Necrotic Amount: [2:Small (1-33%)] [N/A:N/A] Exposed Structures: [N/A:N/A] Fascia: No Fat: No Tendon: No Muscle: No Joint: No Bone: No Limited to Skin Breakdown Epithelialization: Medium (34-66%) N/A N/A Periwound Skin Texture: Edema: Yes N/A N/A Excoriation: No Induration: No Callus: No Crepitus: No Fluctuance: No Friable: No Rash: No Scarring: No Periwound Skin Moist: Yes N/A  N/A Moisture: Maceration: No Dry/Scaly: No Periwound Skin Color: Atrophie Blanche: No N/A N/A Cyanosis: No Ecchymosis: No Erythema: No Hemosiderin Staining: No Mottled: No Pallor: No Rubor: No Temperature: No Abnormality N/A N/A Tenderness on Yes N/A N/A Palpation: Wound Preparation: Ulcer Cleansing: N/A N/A Rinsed/Irrigated with Saline Topical Anesthetic Applied: Other: lidocaine 4% Treatment Notes Electronic Signature(s) Signed: 03/11/2016 3:23:03 PM By: [name] BSN, [name] Entered By: [name] on 03/11/2016 14:20:47 [name] ([id number]) -------------------------------------------------------------------------------- [place] Details Patient Name: [name] Date of Service: 03/11/2016 2:15 PM Medical Record Number: [id number] Patient Account Number: [id number] Date of Birth/Sex: Oct 12, 1945 (70 y.o. Female) Treating [name]: [name], [name], [place] Primary Care Physician: [name] Other Clinician: Referring Physician: Caryl Bis, [name] Treating Physician/Extender: Frann Rider in Treatment: 10 Active Inactive Orientation to the Wound Care Program Nursing Diagnoses: Knowledge deficit related to the wound healing center program Goals: Patient/caregiver will verbalize understanding of the [name] Date Initiated: 12/31/2015 Goal Status: Active Interventions: Provide education on orientation to the wound center Notes: Pressure Nursing Diagnoses: Knowledge deficit related to causes and risk factors for pressure ulcer development Knowledge deficit related to management of pressures ulcers Potential for impaired tissue integrity related to pressure, friction, moisture, and shear Goals: Patient will remain free from development of additional pressure ulcers Date Initiated: 12/31/2015 Goal Status: Active Patient will remain free of pressure ulcers Date Initiated: 12/31/2015 Goal Status:  Active Patient/caregiver will verbalize risk factors for pressure ulcer development Date Initiated: 12/31/2015 Goal Status: Active Patient/caregiver will verbalize understanding of pressure ulcer management Date Initiated: 12/31/2015 Goal Status: Active Interventions: YADHIRA, HELDRETH ([id number]) Assess: immobility, friction, shearing, incontinence upon admission and as needed Assess offloading mechanisms upon admission and as needed Assess potential for pressure ulcer upon admission and as needed Provide education on pressure ulcers Treatment Activities: Patient referred for pressure reduction/relief devices : 12/31/2015 Notes: Wound/Skin Impairment Nursing Diagnoses: Impaired tissue integrity Knowledge deficit related to smoking impact on wound healing Knowledge deficit related to ulceration/compromised skin integrity Goals: Patient/caregiver will verbalize understanding of skin care regimen Date Initiated: 12/31/2015 Goal Status: Active Ulcer/skin breakdown will have a volume reduction of 30% by week 4 Date Initiated: 12/31/2015 Goal Status: Active Ulcer/skin breakdown will have a volume reduction of 50% by week 8 Date Initiated: 12/31/2015 Goal Status: Active Ulcer/skin breakdown will have a volume reduction of 80% by week 12 Date Initiated: 12/31/2015 Goal Status: Active Ulcer/skin breakdown will heal within 14 weeks Date Initiated: 12/31/2015 Goal Status: Active Interventions: Assess patient/caregiver ability to obtain necessary supplies Assess patient/caregiver ability to perform ulcer/skin care  regimen upon admission and as needed Assess ulceration(s) every visit Provide education on ulcer and skin care Treatment Activities: Skin care regimen initiated : 12/31/2015 Topical wound management initiated : 12/31/2015 Notes: YOLANDO, MCGINN ([id number]) Electronic Signature(s) Signed: 03/11/2016 3:23:03 PM By: [name] BSN, [name] Entered By: [name] on 03/11/2016  14:20:37 [name] ([id number]) -------------------------------------------------------------------------------- Pain Assessment Details Patient Name: [name] Date of Service: 03/11/2016 2:15 PM Medical Record Number: [id number] Patient Account Number: [id number] Date of Birth/Sex: 08/28/1945 (70 y.o. Female) Treating [name]: [name], [name], [name] Primary Care Physician: [name] Other Clinician: Referring Physician: Caryl Bis, [name] Treating Physician/Extender: Frann Rider in Treatment: 10 Active Problems Location of Pain Severity and Description of Pain Patient Has Paino No Site Locations With Dressing Change: No Pain Management and Medication Current Pain Management: Electronic Signature(s) Signed: 03/11/2016 3:23:03 PM By: [name] BSN, [name] Entered By: [name] on 03/11/2016 14:10:44 [name] ([id number]) -------------------------------------------------------------------------------- Patient/Caregiver Education Details Patient Name: [name] Date of Service: 03/11/2016 2:15 PM Medical Record Number: [id number] Patient Account Number: [id number] Date of Birth/Gender: 13-Aug-1945 (70 y.o. Female) Treating [name]: [name], [name], [name], [name] Primary Care Physician: [name] Other Clinician: Referring Physician: Caryl Bis, [name] Treating Physician/Extender: Frann Rider in Treatment: 10 Education Assessment Education Provided To: Patient Education Topics Provided Pressure: Methods: Explain/Verbal Responses: State content correctly Welcome To [facility] [place]: Methods: Explain/Verbal Responses: State content correctly Wound/Skin Impairment: Methods: Explain/Verbal Responses: State content correctly Electronic Signature(s) Signed: 03/11/2016 3:23:03 PM By: [name] BSN, [name] Entered By: [name] on 03/11/2016 14:27:30 [name]  ([id number]) -------------------------------------------------------------------------------- Wound Assessment Details Patient Name: [name] Date of Service: 03/11/2016 2:15 PM Medical Record Number: [id number] Patient Account Number: [id number] Date of Birth/Sex: 01/20/1946 (70 y.o. Female) Treating [name]: [name], [name], [place] Primary Care Physician: [name] Other Clinician: Referring Physician: Caryl Bis, [name] Treating Physician/Extender: Frann Rider in Treatment: 10 Wound Status Wound Number: 2 Primary Pressure Ulcer Etiology: Wound Location: Left Calcaneus Secondary Diabetic Wound/Ulcer of the Lower Wounding Event: Pressure Injury Etiology: Extremity Date Acquired: 07/15/2015 Wound Open Weeks Of Treatment: 10 Status: Clustered Wound: No Comorbid Cataracts, Type I Diabetes, History: Rheumatoid Arthritis, Osteoarthritis, Neuropathy, Received Chemotherapy Photos Photo Uploaded By: [name] on 03/11/2016 15:22:27 Wound Measurements Length: (cm) 0.4 Width: (cm) 0.4 Depth: (cm) 0.6 Area: (cm) 0.126 Volume: (cm) 0.075 % Reduction in Area: 46.6% % Reduction in Volume: 20.2% Epithelialization: Medium (34-66%) Tunneling: No Undermining: No Wound Description Classification: Category/Stage III Foul Odor A Diabetic Severity (Wagner): Grade 1 ELYANNA, PELLETT. ([id number]) fter Cleansing: No Wound Margin: Flat and Intact Exudate Amount: Medium Exudate Type: Serosanguineous Exudate Color: red, brown Wound Bed Granulation Amount: Medium (34-66%) Exposed Structure Granulation Quality: Pink, Pale Fascia Exposed: No Necrotic Amount: Small (1-33%) Fat Layer Exposed: No Necrotic Quality: Adherent Slough Tendon Exposed: No Muscle Exposed: No Joint Exposed: No Bone Exposed: No Limited to Skin Breakdown Periwound Skin Texture Texture Color No Abnormalities Noted: No No Abnormalities Noted: No Callus: No Atrophie Blanche: No Crepitus:  No Cyanosis: No Excoriation: No Ecchymosis: No Fluctuance: No Erythema: No Friable: No Hemosiderin Staining: No Induration: No Mottled: No Localized Edema: Yes Pallor: No Rash: No Rubor: No Scarring: No Temperature / Pain Moisture Temperature: No Abnormality No Abnormalities Noted: No Tenderness on Palpation: Yes Dry / Scaly: No Maceration: No Moist: Yes Wound Preparation Ulcer Cleansing: Rinsed/Irrigated with Saline Topical Anesthetic Applied: Other: lidocaine 4%, Treatment Notes Wound #2 (Left Calcaneus) 1. Cleansed with: Clean  wound with Normal Saline 4. Dressing Applied: Prisma Ag 5. Secondary Dressing Applied Bordered Foam Dressing Electronic Signature(s) AEDAN, BURFORD ([id number]) Signed: 03/11/2016 3:23:03 PM By: [name] BSN, [name] Entered By: [name] on 03/11/2016 14:16:51 [name] ([id number]) -------------------------------------------------------------------------------- Vitals Details Patient Name: [name] Date of Service: 03/11/2016 2:15 PM Medical Record Number: [id number] Patient Account Number: [id number] Date of Birth/Sex: Apr 21, 1946 (70 y.o. Female) Treating [name]: [name], [name], [place] Primary Care Physician: [name] Other Clinician: Referring Physician: Caryl Bis, [name] Treating Physician/Extender: Frann Rider in Treatment: 10 Vital Signs Time Taken: 14:12 Temperature (F): 97.8 Height (in): 63 Pulse (bpm): 75 Weight (lbs): 130 Respiratory Rate (breaths/min): 16 Body Mass Index (BMI): 23 Blood Pressure (mmHg): 111/89 Reference Range: 80 - 120 mg / dl Electronic Signature(s) Signed: 03/11/2016 3:23:03 PM By: [name] BSN, [name] Entered By: [name] on 03/11/2016 14:13:03

## 2016-03-14 ENCOUNTER — Telehealth: Payer: Self-pay | Admitting: *Deleted

## 2016-03-14 NOTE — Telephone Encounter (Signed)
Called pt earlier today to see if she wants to come in this week and have chemo or wait til next Monday. She prefers Monday because they had some things they want to do this week.  She had questions about does she cont. Taking tamoxifen, Dr. Jefm Bryant told her to stop methotrexate and prednisone before starting the chemo-when does she want her to stop. Does she need to take steroids.  I checked with Dr. Mike Gip and she states that she does need to stop tamoxifen- it is not working for her. The methotrexate she takes on mondays so she should not take any more because she already took it today.  She takes prednisone daily so her last day of taking it will this sunday11/19.  Patient had another question about to cont. lovenox or not and the answer is yes. They did discuss about changing to eliquis or xarelto. The pharmacy told them that both of them were about the same price.  They will discuss options about changing over to oral blood thinner on monday

## 2016-03-18 ENCOUNTER — Encounter: Payer: Medicare Other | Admitting: Surgery

## 2016-03-18 DIAGNOSIS — E10621 Type 1 diabetes mellitus with foot ulcer: Secondary | ICD-10-CM | POA: Diagnosis not present

## 2016-03-20 ENCOUNTER — Other Ambulatory Visit: Payer: Self-pay | Admitting: Hematology and Oncology

## 2016-03-20 ENCOUNTER — Encounter: Payer: Self-pay | Admitting: Hematology and Oncology

## 2016-03-20 DIAGNOSIS — C569 Malignant neoplasm of unspecified ovary: Secondary | ICD-10-CM

## 2016-03-20 NOTE — Progress Notes (Signed)
[facility] day:  03/07/2016  Chief Complaint: [name] is a 70 y.o. female with stage IIIC ovarian cancer who is seen for sick call visit.  HPI:  The patient was last seen in the medical oncology clinic on 02/22/2016.  At that time, she was doing well.  She denied any symptoms.  CT scan revealed progressive disease.  CA125 was 106.1.  She was on methotrexate and prednisone for her rheumatoid arthritis.  Decision was made to try tamoxifen.  She began tamoxifen on 02/25/2016.  She states that she woke up on 03/04/2016 with abdominal discomfort.  Pain settled in the left upper quadrant and is "sore to mash".  She states that if she sits still, it does not bother her.  She feel tight under her breast.  She denies any sharp pain, but "knows it is there".  She has not taken any pain pills.   Discomfort has persisted.  Bowel movements have been regular.  She denies any melena or hematochezia.  She denies any urinary symptoms.  She denies any fever.  She has been on tamoxifen for 3 weeks.  She denies any side effects.  She has a rare hot flash.    Past Medical History:  Diagnosis Date  . CAD (coronary artery disease)    a. 09/2012 Cath: LM nl, LAD 95p, 37m LCX 931mOM2 50, RCA 100.  . Marland Kitchenholelithiasis   . Chronic diastolic CHF (congestive heart failure) ([facility]  . Collagen vascular disease ([facility] [place]  . Esophageal stricture   . Exertional shortness of breath   . GERD (gastroesophageal reflux disease)   . H/O hiatal hernia   . Herniated disc   . History of pancytopenia   . Hyperlipidemia   . Hypertension   . Hypokalemia   . Leukopenia 2012   s/p bone marrow biopsy, Dr. PaMa Hillock. NSTEMI (non-ST elevated myocardial infarction) ([facility]5/2014   "mild" (10/18/2012)  . Ovarian cancer ([facility]2016   chemo  . Pneumonia 2013; 08/2012   "one lung; double" (10/18/2012)  . Rheumatoid arthritis(714.0)   . Type I diabetes mellitus ([facility] [place]   "dx'd in 1957"  (10/18/2012)    Past Surgical History:  Procedure Laterality Date  . ABDOMINAL HYSTERECTOMY  06/15/2015   Dx L/S, EXLAP TAH BSO omentectomy RSRx colostomy diaphragm resection stripping  . CARDIAC CATHETERIZATION  10/18/2012   "first one was today" (10/18/2012)  . CATARACT EXTRACTION W/ INTRAOCULAR LENS IMPLANT Right 2010  . CHOLECYSTECTOMY  06/15/2015   combined case with ovarian cancer debulking  . COLON SURGERY    . CORONARY ARTERY BYPASS GRAFT N/A 10/19/2012   Procedure: CORONARY ARTERY BYPASS GRAFTING (CABG);  Surgeon: StMelrose NakayamaMD;  [facility] Service: Open Heart Surgery;  Laterality: N/A;  . ESOPHAGEAL DILATION     "3 or 4 times" (10/19/2011)  . ESOPHAGOGASTRODUODENOSCOPY  2012   Dr. IfMinna Merritts. OSTOMY    . OVARY SURGERY     removal  . PERIPHERAL VASCULAR CATHETERIZATION N/A 03/02/2015   Procedure: PoGlori Luisath Insertion;  Surgeon: JaAlgernon HuxleyMD;  [facility];  Service: Cardiovascular;  Laterality: N/A;  . TUBAL LIGATION  1970    Family History  Problem Relation Age of Onset  . Diabetes Mother   . Arthritis Mother   . Diabetes Father   . Arthritis Father   . Bone cancer Sister     Social History:  reports  that she quit smoking about 21 years ago. Her smoking use included Cigarettes. She has a 30.00 pack-year smoking history. She has never used smokeless tobacco. She reports that she does not drink alcohol or use drugs.  The patient is accompanied by her husband today.  Allergies:  Allergies  Allergen Reactions  . Codeine Nausea And Vomiting  . Latex Rash    Current Medications: Current Outpatient Prescriptions  Medication Sig Dispense Refill  . Calcium-Vitamin D 600-200 MG-UNIT tablet Take 2 tablets by mouth daily.     Marland Kitchen enoxaparin (LOVENOX) 60 MG/0.6ML injection Inject 0.6 mLs (60 mg total) into the skin every 12 (twelve) hours. Held last pm and this am dose for bone marrow biopsy 60 Syringe 0  . folic acid (FOLVITE) 1 MG tablet  TAKE 1 TABLET BY MOUTH ONCE A DAY 90 tablet 3  . furosemide (LASIX) 80 MG tablet Take 80 mg by mouth daily as needed.     . Insulin Human (INSULIN PUMP) SOLN Pt uses Humalog insulin.    Marland Kitchen insulin lispro (HUMALOG) 100 UNIT/ML injection Inject 0.06 mLs (6 Units total) into the skin daily. 30 mL 3  . lidocaine-prilocaine (EMLA) cream Apply 1 application topically as needed (prior to accessing port).    . loratadine (CLARITIN) 10 MG tablet Take 1 tablet by mouth daily as needed.    Marland Kitchen losartan (COZAAR) 100 MG tablet Take 1 tablet (100 mg total) by mouth daily. 90 tablet 1  . methotrexate (RHEUMATREX) 2.5 MG tablet Take by mouth.    . metoprolol tartrate (LOPRESSOR) 25 MG tablet TAKE 1 TABLET BY MOUTH TWICE A DAY AS NEEDED 180 tablet 3  . ondansetron (ZOFRAN) 8 MG tablet Take 1 tablet (8 mg total) by mouth every 8 (eight) hours as needed for nausea or vomiting. 20 tablet 1  . ONE TOUCH ULTRA TEST test strip     . oxyCODONE (OXY IR/ROXICODONE) 5 MG immediate release tablet Take 5 mg by mouth every 6 (six) hours as needed. Reported on 11/17/2015    . pantoprazole (PROTONIX) 40 MG tablet TAKE 1 TABLET BY MOUTH TWICE A DAY 180 tablet 1  . potassium chloride SA (K-DUR,KLOR-CON) 20 MEQ tablet Take 2 tablets (40 mEq total) by mouth daily. 60 tablet 3  . predniSONE (DELTASONE) 5 MG tablet Take 5 mg by mouth as needed. Reported on 09/17/2015    . ranitidine (ZANTAC) 150 MG tablet Take 1 tablet (150 mg total) by mouth 2 (two) times daily. (Patient taking differently: Take 150 mg by mouth 2 (two) times daily as needed. ) 180 tablet 2  . tamoxifen (NOLVADEX) 20 MG tablet Take 1 tablet (20 mg total) by mouth daily. 30 tablet 1  . vitamin C (ASCORBIC ACID) 500 MG tablet Take 500 mg by mouth daily.    Marland Kitchen zinc gluconate 50 MG tablet Take 50 mg by mouth daily.     No current facility-administered medications for this visit.    Facility-Administered Medications Ordered in Other Visits  Medication Dose Route Frequency  Provider Last Rate Last Dose  . sodium chloride 0.9 % injection 10 mL  10 mL Intracatheter PRN Lequita Asal, MD      . sodium chloride 0.9 % injection 10 mL  10 mL Intravenous PRN Lequita Asal, MD   10 mL at 04/13/15 3437    Review of Systems:  GENERAL:  Feels "ok".  No fever, chills or sweats.  Weight down 1 pound. PERFORMANCE STATUS (ECOG):  1 HEENT:  No visual changes, sore throat, mouth sores or tenderness. Lungs:  No shortness of breath or cough.  No hemoptysis. Cardiac:  No chest pain, palpitations, orthopnea, or PND. GI:  Abdominal discomfort, settled in left upper quadrant.  No constipation, diarrhea, melena or hematochezia. GU:  No urgency, frequency, dysuria, or hematuria. Musculoskeletal:  Severe rheumatoid arthritis on MTX and prednisone.  Osteoporosis.  No back pain. No muscle tenderness. Extremities:  No pain or swelling. Skin:  No rashes or skin changes. Neuro:  Neuropathy (stable).  No headache, numbness or weakness, balance or coordination issues. Endocrine:  Diabetes on an insulin pump.  No thyroid issues, hot flashes or night sweats. Psych:  No mood changes, depression or anxiety. Pain: Abdominal pain (LUQ) rated 5 of 10. Review of systems:  All other systems reviewed and found to be negative.  Physical Exam: Blood pressure 133/76, pulse 83, temperature 98 F (36.7 C), temperature source Tympanic, weight 138 lb 3.7 oz (62.7 kg). GENERAL:  Well developed, well nourished woman sitting comfortably in the exam room in no acute distress. MENTAL STATUS:  Alert and oriented to person, place and time. HEAD:  Curly gray hair.  Normocephalic, atraumatic, face symmetric, no Cushingoid features. EYES:  Gold rimmed glasses.  Blue eyes.  No conjunctivitis or scleral icterus.  ENT: Oropharynx clear without lesion. Tongue normal. Mucous membranes moist.  RESPIRATORY: Clear to auscultation without rales, wheezes or rhonchi. CARDIOVASCULAR: Regular rate and rhythm  without murmur, rub or gallop. ABDOMEN: Soft, tender in LUQ without guarding or rebound tenderness.  Active bowel sounds and no appreciable hepatosplenomegaly. No palpable nodularity or masses. SKIN: No rashes, ulcers, or bruises. EXTREMITIES:  No edema, skin discoloration or tenderness. No palpable cords. LYMPH NODES: No palpable cervical, supraclavicular, axillary or inguinal adenopathy  NEUROLOGICAL: Appropriate. PSYCH: Appropriate.   Appointment on 03/07/2016  Component Date Value Ref Range Status  . WBC 03/07/2016 2.7* 3.6 - 11.0 K/uL Final  . RBC 03/07/2016 3.86  3.80 - 5.20 MIL/uL Final  . Hemoglobin 03/07/2016 11.6* 12.0 - 16.0 g/dL Final  . HCT 03/07/2016 32.9* 35.0 - 47.0 % Final  . MCV 03/07/2016 85.3  80.0 - 100.0 fL Final  . MCH 03/07/2016 30.1  26.0 - 34.0 pg Final  . MCHC 03/07/2016 35.3  32.0 - 36.0 g/dL Final  . RDW 03/07/2016 15.6* 11.5 - 14.5 % Final  . Platelets 03/07/2016 198  150 - 440 K/uL Final  . Neutrophils Relative % 03/07/2016 58  % Final  . Neutro Abs 03/07/2016 1.6  1.4 - 6.5 K/uL Final  . Lymphocytes Relative 03/07/2016 26  % Final  . Lymphs Abs 03/07/2016 0.7* 1.0 - 3.6 K/uL Final  . Monocytes Relative 03/07/2016 15  % Final  . Monocytes Absolute 03/07/2016 0.4  0.2 - 0.9 K/uL Final  . Eosinophils Relative 03/07/2016 0  % Final  . Eosinophils Absolute 03/07/2016 0.0  0 - 0.7 K/uL Final  . Basophils Relative 03/07/2016 1  % Final  . Basophils Absolute 03/07/2016 0.0  0 - 0.1 K/uL Final  . Sodium 03/07/2016 132* 135 - 145 mmol/L Final  . Potassium 03/07/2016 4.9  3.5 - 5.1 mmol/L Final  . Chloride 03/07/2016 98* 101 - 111 mmol/L Final  . CO2 03/07/2016 27  22 - 32 mmol/L Final  . Glucose, Bld 03/07/2016 219* 65 - 99 mg/dL Final  . BUN 03/07/2016 24* 6 - 20 mg/dL Final  . Creatinine, Ser 03/07/2016 0.73  0.44 - 1.00 mg/dL Final  . Calcium 03/07/2016 9.1  8.9 - 10.3 mg/dL Final  . Total Protein 03/07/2016 7.4  6.5 - 8.1 g/dL Final  . Albumin  03/07/2016 3.3* 3.5 - 5.0 g/dL Final  . AST 03/07/2016 16  15 - 41 U/L Final  . ALT 03/07/2016 28  14 - 54 U/L Final  . Alkaline Phosphatase 03/07/2016 108  38 - 126 U/L Final  . Total Bilirubin 03/07/2016 <0.1* 0.3 - 1.2 mg/dL Final  . GFR calc non Af Amer 03/07/2016 >60  >60 mL/min Final  . GFR calc Af Amer 03/07/2016 >60  >60 mL/min Final   Comment: (NOTE) The eGFR has been calculated using the CKD EPI equation. This calculation has not been validated in all clinical situations. eGFR's persistently <60 mL/min signify possible Chronic Kidney Disease.   . Anion gap 03/07/2016 7  5 - 15 Final  . CA 125 03/08/2016 138.8* 0.0 - 38.1 U/mL Final   Comment: (NOTE) [employer] ECLIA methodology Performed At: [facility] [place], [place] [id number] Lindon Romp MD WU:[id number]   . Color, Urine 03/07/2016 YELLOW* YELLOW Final  . APPearance 03/07/2016 CLEAR* CLEAR Final  . Glucose, UA 03/07/2016 >500* NEGATIVE mg/dL Final  . Bilirubin Urine 03/07/2016 NEGATIVE  NEGATIVE Final  . Ketones, ur 03/07/2016 NEGATIVE  NEGATIVE mg/dL Final  . Specific Gravity, Urine 03/07/2016 1.012  1.005 - 1.030 Final  . Hgb urine dipstick 03/07/2016 NEGATIVE  NEGATIVE Final  . pH 03/07/2016 6.0  5.0 - 8.0 Final  . Protein, ur 03/07/2016 NEGATIVE  NEGATIVE mg/dL Final  . Nitrite 03/07/2016 NEGATIVE  NEGATIVE Final  . Leukocytes, UA 03/07/2016 NEGATIVE  NEGATIVE Final  . RBC / HPF 03/07/2016 0-5  0 - 5 RBC/hpf Final  . WBC, UA 03/07/2016 0-5  0 - 5 WBC/hpf Final  . Bacteria, UA 03/07/2016 NONE SEEN  NONE SEEN Final  . Squamous Epithelial / LPF 03/07/2016 0-5* NONE SEEN Final  . Mucous 03/07/2016 PRESENT   Final  . Hyaline Casts, UA 03/07/2016 PRESENT   Final  . Specimen Description 03/08/2016 URINE, CLEAN CATCH   Final  . Special Requests 03/08/2016 NONE   Final  . Culture 03/08/2016 *  Final                   Value:<10,000 COLONIES/mL INSIGNIFICANT GROWTH Performed at [facility]   . Report Status 03/08/2016 03/08/2016 FINAL   Final    Assessment:  [name] is a 70 y.o. female with clinical stage IIIC (T3cN1Mx) ovarian cancer presenting with abdominal discomfort and bloating.  Omental biopsy on 02/23/2015 revealed metastatic high grade serous carcinoma, consistent with gynecologic origin.  CA125 was 707 on 02/17/2015.  Abdomen and pelvic CT scan on 02/13/2015 revealed bilateral mass-like adnexal regions (right adnexal mass 5.6 x 5.0 cm and the left adnexa mass 10.3 x 6.0 x 9.9 cm).  There was a large amount of soft tissue throughout the peritoneal cavity involving the omentum and other peritoneal surfaces.  There was a small volume ascites. There was a peripheral 3.3 x 1.9 cm low-attenuation lesion overlying the right lobe of the liver , likely representing a serosal implant. There was 1.4 x 2.2 cm ill-defined peripheral lesion within the inferior aspect of segment 6 adjacent to the ampulla in the duodenum.   She received 4 cycles of neoadjuvant carboplatin and Taxol (03/05/2015 - 05/22/2015).  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  She had loose stools on oral magnesium.  She was initially on Neurontin then  switched Lyrica with cycle #3.  Cycle #4 was notable for neutropenia (ANC 300) requiring GCSF x 3 days.    CA125 was 802.9 on 03/30/2015, 567.9 on 04/13/2015, 168.8 on 05/15/2015, 85.2 on 07/17/2015, 68.6 on 07/28/2015, 34.5 on 09/17/2015, 20.7 on 11/06/2015, 49 on 01/22/2016, 106.1 on 02/22/2016, and 138.8 on 03/07/2016.  Abdominal and pelvic CT scan on 04/28/2015 revealed decreasing bilateral ovarian masses.   The left adnexal mass measured 4.0 x 6.1 cm (previously 5.0 x 8.5 cm). The right ovary measured 3.8 x 4.9 cm (previously 4.7 x 5.6 cm).  There was improved peritoneal carcinomatosis.  There was a small amount of ascites.  The hepatic dome lesion was stable. The previously seen right hepatic lobe lesion was not well-visualized.  There was a  small left pleural effusion and trace right pleural effusion.  The nodular lesion at the ampulla of Vater, extending into the duodenum was stable.  There was no biliary ductal dilatation.  She underwent exploratory laparotomy, lysis of adhesions, total abdominal hysterectomy with bilateral salpingo-oophorectomy, infracolic omentectomy, optimal tumor debulking(< 1 cm), recto-sigmoid resection with creation of end colostomy, cholecystectomy, mobilization of splenic flexure and liver with diaphragmatic stripping on 06/15/2015. The right diaphragm was cleared of tumor. During dissection, the diaphragm was entered and closed with sutures.   She has had a recurrent right sided pleural effusion.  She underwent thoracentesis of 650 cc on post-operative day 3.  She was admitted to [facility] on 07/01/2015 and 07/07/2015 for recurrent shortness of breath.  She has undergone 2 additional thoracentesis (1.1 L on 07/02/2015 and 850 cc on 07/08/2015).  Cytology was negative x 2.  Bilateral lower extremity duplex on 07/03/2015 was negative.  Echo on 07/08/2015 revealed en EF of 55-60%.    She has severe rheumatoid arthritis.  Methotrexate and Enbrel were initially on hold.  She has a normocytic anemia.  Work-up on 02/17/2015 revealed a normal ferritin, B12, folate, TSH.  She denies any melena or hematochezia.    She has anemia likely due to chronic disease. She received 1 unit PRBCs during her admission at [facility]. She denies any melena or hematochezia. She has diabetes and is on an insulin pump.  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 She is on Lovenox 60 mg twice a day.   She was admitted to [facility] from 07/29/2015 - 08/10/2015 with a right-sided empyema and liver abscess. She underwent CT-guided placement of a liver abscess drain on 07/30/2015. Liver abscess culture grew  out group B strep and Enterobacter which was sensitive to Zosyn. She was transitioned to ertapenem Colbert Ewing) prior to discharge.  She was readmitted to [facility] on 09/17/2015.  Chest, abdomen, and pelvic CT scan on 09/01/2015 revealed continued decrease in perihepatic fluid collection contiguous with the right pleural space with percutaneous drain. Drain was removed on 09/01/2015.  Chest, abdomen, and pelvic CT scan on 09/11/2015 revealed a residual versus recurrent fluid collection posterior to the right hepatic lobe (2.6 x 1.1 x 4.0 cm).  Chest, abdomen, and pelvic CT scan on 10/13/2015 revealed resolution of the empyema.    She has had persistent neutropenia felt secondary to her rheumatoid arthritis.  Folate and MMA were normal.  TSH was 6.13 (high) with a free T4 of 1.17 (0.61-1.12).  She began methotrexate (10 mg a week) and prednisone (5 mg a day) for severe rheumatoid arthritis on 12/17/2015.  Counts are improving.  Bone marrow aspirate and biopsy on 06/09/2010 revealed a hypercellular marrow (70%) with no evidence of dysplasia or malignancy.  Flow cytometry was negative.  Cytogenetics were normal (46,XX).  FISH studies were negative for MDS.  Bone marrow aspirate and biopsy on 12/03/2015 revealed a normocellular to mildy hypercellular marrow for age (40%) with left shifted myelopoiesis, non specific dyserythropoiesis and mild megakaryocytic atypia with no increase in blasts.  There were multiple small nonspecific lymphoid aggregates (favor reactive). There was no increase in reticulin.  There was decreased myeloid cells (37%) with left shifted maturation and 1% atypical myelod blasts.  There was relatively increased monocytic cells (11%), relatively increased lymphoid cells (36%), and relatively increased eosinophils (6%).  Cytogenetics were normal (68, XX).  SNP microarray was normal.  Alkaline phosphatase was 348 (38-126) on 11/06/2015 and 431 on 11/17/2015.   Fractionated alkaline phosphatase on  11/09/2015 revealed 21% bone and 79% liver.  MRI of the abdomen on 12/14/2015 revealed mild extrahepatic biliary duct dilatation without obstructing lesion identified.  Chest, abdomen, and pelvic CT scan on 02/15/2016 revealed multiple soft tissue nodules throughout the pelvis, small bowel mesenteric and peritoneum and, concerning for peritoneal metastatic disease.  There was soft tissue irregularity within the left upper quadrant.  There was mild right hydronephrosis (etiology unclear).  There was interval resolution of previously described right pleural based fluid and gas collection. There was a pleural based nodule within the right lower hemi-thorax concerning for pleural based metastasis.  There were small bilateral pleural effusions.  There was pulmonary nodularity, predominately within the left upper lobe (metastatic or infectious/inflammatory etiology).  There was slightly increased mediastinal adenopathy (infectious/inflammatory or metastatic).  There was a low attenuation lesion within the left hepatic lobe (complicated fluid within the fissure or metastatic disease).  She began tamoxifen on 02/25/2016.  She is tolerating it well.  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  Symptomatically, she has a 3 day history of abdominal discomfort.  She denies any urinary symptoms.  Exam reveals tenderness in the left upper quadrant without guarding or rebound tenderness.  Plan: 1.  Labs today:  CBC with diff, CMP, CA125. 2.  Urinalysis and culture. 3.  Abdomen and pelvic CT scan today or tomorrow 4.  Contact patient with CT results. 5.  RTC as previously scheduled.  Addendum:  Abdomen and pelvic CT scan on 03/08/2016 revealed mild progression of peritoneal disease in the abdomen.  There was interval increase in loculated fluid around the lateral segment left liver, stomach, and spleen.  There was persistent soft tissue lesion at the level of the ampulla with mild intra and  extrahepatic biliary duct dilatation.  There was persistent intrahepatic and capsular metastatic disease involving the liver.     Lequita Asal, MD  03/07/2016

## 2016-03-20 NOTE — Progress Notes (Signed)
EMMERSEN, HEEKE ([id number]) Visit Report for 03/18/2016 Chief Complaint Document Details Patient Name: [name], [name] 03/18/2016 2:15 Date of Service: PM Medical Record [id number] Number: Patient Account Number: [id number] [birth year] (70 y.o. Treating RN: Cornell Barman Date of Birth/Sex: Female) Other Clinician: Primary Care Physician: Caryl Bis, ERIC Treating Christin Fudge Referring Physician: Caryl Bis, ERIC Physician/Extender: Suella Grove in Treatment: 11 Information Obtained from: Patient Chief Complaint Patient is at the clinic for treatment of an open pressure ulcer to the left heel which she has for over 5 months Electronic Signature(s) Signed: 03/18/2016 2:49:43 PM By: Christin Fudge MD, FACS Entered By: Christin Fudge on 03/18/2016 14:49:43 [name] ([id number]) -------------------------------------------------------------------------------- HPI Details Patient Name: [name], [name] 03/18/2016 2:15 Date of Service: PM Medical Record [id number] Number: Patient Account Number: [id number] [birth year] (70 y.o. Treating RN: Cornell Barman Date of Birth/Sex: Female) Other Clinician: Primary Care Physician: Caryl Bis, ERIC Treating Christin Fudge Referring Physician: Caryl Bis, ERIC Physician/Extender: Suella Grove in Treatment: 11 History of Present Illness Location: ulcerated on the right heel Quality: Patient reports experiencing a dull pain to affected area(s). Severity: Patient states wound are getting worse. Duration: Patient has had the wound for <5 months prior to presenting for treatment Timing: Pain in wound is Intermittent (comes and goes Context: The wound appeared gradually over time Modifying Factors: Consults to this date include: local care was done as per the medication given by the PCP which may have been medihoney Associated Signs and Symptoms: Patient reports having difficulty standing for long periods. 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 In the past she was seen for a decubitus ulcer on her right gluteal area. She is known to have diabetes mellitus type 1 and her most recent A1c was 7.2% Past medical history significant for leukopenia, cholelithiasis, hypertension, chronic diastolic CHF, coronary artery disease,type 1 diabetes mellitus, rheumatoid arthritis, collagen vascular disease, ovarian cancer, status post CABG in 2014 and status post Port-A-Cath insertion by Dr. Leotis Pain in October 2016. She has quit smoking about 20 years ago. She was advised to use Medihoney with calcium alginate pads to be applied over the wound. 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 01/07/2016 -- x-ray of the left heel done -- IMPRESSION: No radiographic evidence of osteomyelitis. If this remains a clinical concern, recommend MR imaging. 02/05/16: returns today for f/u. denies fever, chills, body aches or malaise. no interval changes regarding health status. 02/12/16: pt is doing well. wound has improved. no s/s of infection. no systemic s/s of infection. She reports will be on vacation next week, and requests appointment in 2 weeks. 03/18/2016 - is going to be starting her chemotherapy this coming Monday and will have cycles every 3 weeks Electronic Signature(s) Signed: 03/18/2016 2:50:20 PM By: Christin Fudge MD, FACS ZILPHIA, TRESSLER ([id number]) Entered By: Christin Fudge on 03/18/2016 14:50:20 [name]  ([id number]) -------------------------------------------------------------------------------- Physical Exam Details Patient Name: [name], [name] 03/18/2016 2:15 Date of Service: PM Medical Record [id number] Number: Patient Account Number: [id number] 02/04/1946 (70 y.o. Treating RN: Cornell Barman  Date of Birth/Sex: Female) Other Clinician: Primary Care Physician: Caryl Bis, ERIC Treating Christin Fudge Referring Physician: Caryl Bis, ERIC Physician/Extender: Weeks in Treatment: 11 Constitutional . Pulse regular. Respirations normal and unlabored. Afebrile. . Eyes Nonicteric. Reactive to light. Ears, Nose, Mouth, and Throat Lips, teeth, and gums WNL.Marland Kitchen Moist mucosa without lesions. Neck supple and nontender. No palpable supraclavicular or cervical adenopathy. Normal sized without goiter. Respiratory WNL. No retractions.. Breath sounds WNL, No rubs, rales, rhonchi, or wheeze.. Cardiovascular Heart rhythm and rate regular, no murmur or gallop.. Pedal Pulses WNL. No clubbing, cyanosis or edema. Lymphatic No adneopathy. No adenopathy. No adenopathy. Musculoskeletal Adexa without tenderness or enlargement.. Digits and nails w/o clubbing, cyanosis, infection, petechiae, ischemia, or inflammatory conditions.. Integumentary (Hair, Skin) No suspicious lesions. No crepitus or fluctuance. No peri-wound warmth or erythema. No masses.Marland Kitchen Psychiatric Judgement and insight Intact.. No evidence of depression, anxiety, or agitation.. Notes the wound is cleaned out nicely with a forcep and moist saline gauze and it looks clean at the depth. No sharp debridement was required today. Electronic Signature(s) Signed: 03/18/2016 2:50:44 PM By: Christin Fudge MD, FACS Entered By: Christin Fudge on 03/18/2016 14:50:44 [name] ([id number]) -------------------------------------------------------------------------------- Physician Orders Details Patient Name: KEUNDRA, TERRERO 03/18/2016 2:15 Date  of Service: PM Medical Record [id number] Number: Patient Account Number: [id number] 1945-07-19 (70 y.o. Treating RN: Cornell Barman Date of Birth/Sex: Female) Other Clinician: Primary Care Physician: Caryl Bis, ERIC Treating Christin Fudge Referring Physician: Caryl Bis, ERIC Physician/Extender: Suella Grove in Treatment: 11 Verbal / Phone Orders: Yes Clinician: Cornell Barman Read Back and Verified: Yes Diagnosis Coding Wound Cleansing Wound #2 Left Calcaneus o Cleanse wound with mild soap and water o May Shower, gently pat wound dry prior to applying new dressing. Anesthetic Wound #2 Left Calcaneus o Topical Lidocaine 4% cream applied to wound bed prior to debridement Skin Barriers/Peri-Wound Care Wound #2 Left Calcaneus o Skin Prep Primary Wound Dressing Wound #2 Left Calcaneus o Other: - polymem ag Secondary Dressing Wound #2 Left Calcaneus o Boardered Foam Dressing - or Coverllette Bandaid Dressing Change Frequency Wound #2 Left Calcaneus o Change dressing every other day. Follow-up Appointments Wound #2 Left Calcaneus o Return Appointment in 1 week. Off-Loading Wound #2 Left Calcaneus o Heel suspension boot to: - ELevate on pillows CAYCEE, MATTHIAS. ([id number]) Additional Orders / Instructions Wound #2 Left Calcaneus o Increase protein intake. o Activity as tolerated o Other: - INclude Vitamin A, C, ZInc, MVI in diet Electronic Signature(s) Signed: 03/18/2016 4:17:50 PM By: Christin Fudge MD, FACS Signed: 03/18/2016 5:15:40 PM By: Gretta Cool, RN, BSN, Kim RN, BSN Entered By: Gretta Cool, RN, BSN, Kim on 03/18/2016 14:49:18 Cahall, Sofie Hartigan ([id number]) -------------------------------------------------------------------------------- Problem List Details Patient Name: [name], [name] 03/18/2016 2:15 Date of Service: PM Medical Record [id number] Number: Patient Account Number: [id number] 07-08-1945 (70 y.o. Treating RN: Cornell Barman Date of  Birth/Sex: Female) Other Clinician: Primary Care Physician: Caryl Bis, ERIC Treating Christin Fudge Referring Physician: Caryl Bis, ERIC Physician/Extender: Suella Grove in Treatment: 11 Active Problems ICD-10 Encounter Code Description Active Date Diagnosis E10.621 Type 1 diabetes mellitus with foot ulcer 12/31/2015 Yes L89.623 Pressure ulcer of left heel, stage 3 12/31/2015 Yes Z92.21 Personal history of antineoplastic chemotherapy 12/31/2015 Yes Z79.52 Long term (current) use of systemic steroids 12/31/2015 Yes Inactive Problems Resolved Problems Electronic Signature(s) Signed: 03/18/2016 2:49:38 PM By: Christin Fudge MD, FACS Entered By: Christin Fudge on 03/18/2016 14:49:38 [name] ([id number]) -------------------------------------------------------------------------------- Progress Note Details Patient Name: [name], [name] 03/18/2016 2:15 Date of Service: PM Medical Record [id number] Number: Patient  Account Number: [id number] October 07, 1945 (70 y.o. Treating RN: Cornell Barman Date of Birth/Sex: Female) Other Clinician: Primary Care Physician: Caryl Bis, ERIC Treating Merial Moritz Referring Physician: Caryl Bis, ERIC Physician/Extender: Suella Grove in Treatment: 11 Subjective Chief Complaint Information obtained from Patient Patient is at the clinic for treatment of an open pressure ulcer to the left heel which she has for over 5 months History of Present Illness (HPI) The following HPI elements were documented for the patient's wound: Location: ulcerated on the right heel Quality: Patient reports experiencing a dull pain to affected area(s). Severity: Patient states wound are getting worse. Duration: Patient has had the wound for Timing: Pain in wound is Intermittent (comes and goes Context: The wound appeared gradually over time Modifying Factors: Consults to this date include: local care was done as per the medication given by the PCP which may have been  medihoney Associated Signs and Symptoms: Patient reports having difficulty standing for long periods. 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 In the past she was seen for a decubitus ulcer on her right gluteal area. She is known to have diabetes mellitus type 1 and her most recent A1c was 7.2% Past medical history significant for leukopenia, cholelithiasis, hypertension, chronic diastolic CHF, coronary artery disease,type 1 diabetes mellitus, rheumatoid arthritis, collagen vascular disease, ovarian cancer, status post CABG in 2014 and status post Port-A-Cath insertion by Dr. Leotis Pain in October 2016. She has quit smoking about 20 years ago. She was advised to use Medihoney with calcium alginate pads to be applied over the wound. 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 01/07/2016 -- x-ray of the left heel done -- IMPRESSION: No radiographic evidence of osteomyelitis. If this remains a clinical concern, recommend MR imaging. 02/05/16: returns today for f/u. denies fever, chills, body aches or malaise. no interval changes regarding health status. BOBETTA, MATTHEW ([id number]) 02/12/16: pt is doing well. wound has improved. no s/s of infection. no systemic s/s of infection. She reports will be on vacation next week, and requests appointment in 2 weeks. 03/18/2016 - is going to be starting her chemotherapy this coming Monday and will have cycles every 3 weeks Objective Constitutional Pulse regular. Respirations  normal and unlabored. Afebrile. Vitals Time Taken: 2:16 PM, Height: 63 in, Weight: 130 lbs, BMI: 23, Pulse: 78 bpm, Respiratory Rate: 16 breaths/min, Blood Pressure: 124/68 mmHg. Eyes Nonicteric. Reactive to light. Ears, Nose, Mouth, and Throat Lips, teeth, and gums WNL.Marland Kitchen Moist mucosa without lesions. Neck supple and nontender. No palpable supraclavicular or cervical adenopathy. Normal sized without goiter. Respiratory WNL. No retractions.. Breath sounds WNL, No rubs, rales, rhonchi, or wheeze.. Cardiovascular Heart rhythm and rate regular, no murmur or gallop.. Pedal Pulses WNL. No clubbing, cyanosis or edema. Lymphatic No adneopathy. No adenopathy. No adenopathy. Musculoskeletal Adexa without tenderness or enlargement.. Digits and nails w/o clubbing, cyanosis, infection, petechiae, ischemia, or inflammatory conditions.Marland Kitchen Psychiatric Judgement and insight Intact.. No evidence of depression, anxiety, or agitation.. General Notes: the wound is cleaned out nicely with a forcep and moist saline gauze and it  looks clean at the depth. No sharp debridement was required today. Integumentary (Hair, Skin) No suspicious lesions. No crepitus or fluctuance. No peri-wound warmth or erythema. No masses.Stann Mainland, Sofie Hartigan ([id number]) Wound #2 status is Open. Original cause of wound was Pressure Injury. The wound is located on the Left Calcaneus. The wound measures 0.4cm length x 0.5cm width x 0.8cm depth; 0.157cm^2 area and 0.126cm^3 volume. There is no tunneling noted, however, there is undermining starting at 5:00 and ending at 8:00 with a maximum distance of 1cm. There is a medium amount of serous drainage noted. The wound margin is flat and intact. There is no granulation within the wound bed. There is a medium (34-66%) amount of necrotic tissue within the wound bed including Adherent Slough. The periwound skin appearance exhibited: Induration, Moist. The periwound skin appearance did not  exhibit: Callus, Crepitus, Excoriation, Fluctuance, Friable, Localized Edema, Rash, Scarring, Dry/Scaly, Maceration, Atrophie Blanche, Cyanosis, Ecchymosis, Hemosiderin Staining, Mottled, Pallor, Rubor, Erythema. Periwound temperature was noted as No Abnormality. The periwound has tenderness on palpation. General Notes: Unable to visualize id any structures are exposed. Assessment Active Problems ICD-10 E10.621 - Type 1 diabetes mellitus with foot ulcer L89.623 - Pressure ulcer of left heel, stage 3 Z92.21 - Personal history of antineoplastic chemotherapy Z79.52 - Long term (current) use of systemic steroids Plan Wound Cleansing: Wound #2 Left Calcaneus: Cleanse wound with mild soap and water May Shower, gently pat wound dry prior to applying new dressing. Anesthetic: Wound #2 Left Calcaneus: Topical Lidocaine 4% cream applied to wound bed prior to debridement Skin Barriers/Peri-Wound Care: Wound #2 Left Calcaneus: Skin Prep Primary Wound Dressing: Wound #2 Left Calcaneus: Other: - polymem ag Secondary Dressing: Wound #2 Left Calcaneus: Boardered Foam Dressing - or Coverllette Bandaid Dressing Change Frequency: DAKSHA, MACATANGAY ([id number]) Wound #2 Left Calcaneus: Change dressing every other day. Follow-up Appointments: Wound #2 Left Calcaneus: Return Appointment in 1 week. Off-Loading: Wound #2 Left Calcaneus: Heel suspension boot to: - ELevate on pillows Additional Orders / Instructions: Wound #2 Left Calcaneus: Increase protein intake. Activity as tolerated Other: - INclude Vitamin A, C, ZInc, MVI in diet I have recommended packing the wound with PolyMem silver rope, and we will change this every other day. She will be seen back in 2 weeks' time because of her chemotherapy and the holiday Electronic Signature(s) Signed: 03/18/2016 2:51:39 PM By: Christin Fudge MD, FACS Entered By: Christin Fudge on 03/18/2016 14:51:39 [name]  ([id number]) -------------------------------------------------------------------------------- SuperBill Details Patient Name: [name] Date of Service: 03/18/2016 Medical Record Number: [id number] Patient Account Number: [id number] Date of Birth/Sex: Jul 21, 1945 (70 y.o. Female) Treating RN: Cornell Barman Primary Care Physician: Caryl Bis, ERIC Other Clinician: Referring Physician: Caryl Bis, ERIC Treating Physician/Extender: Frann Rider in Treatment: 11 Diagnosis Coding ICD-10 Codes Code Description E10.621 Type 1 diabetes mellitus with foot ulcer L89.623 Pressure ulcer of left heel, stage 3 Z92.21 Personal history of antineoplastic chemotherapy Z79.52 Long term (current) use of systemic steroids Facility Procedures CPT4 Code: [id number] Description: [phone] - WOUND CARE VISIT-LEV 2 EST PT Modifier: Quantity: 1 Physician Procedures CPT4 Code: [id number] Description: [id number] - WC PHYS LEVEL 3 - EST PT ICD-10 Description Diagnosis E10.621 Type 1 diabetes mellitus with foot ulcer L89.623 Pressure ulcer of left heel, stage 3 Z92.21 Personal history of antineoplastic chemother Z79.52 Long term (current) use of  systemic steroids Modifier: apy Quantity: 1 Electronic Signature(s) Signed: 03/18/2016 2:51:51 PM By: Christin Fudge MD, FACS Entered By: Christin Fudge on 03/18/2016 14:51:51

## 2016-03-20 NOTE — Progress Notes (Signed)
[name], [name] ([id number]) Visit Report for 03/18/2016 Arrival Information Details Patient Name: [name], [name] Date of Service: 03/18/2016 2:15 PM Medical Record Number: [id number] Patient Account Number: [id number] Date of Birth/Sex: Jul 26, 1945 (70 y.o. Female) Treating RN: Cornell Barman Primary Care Physician: Caryl Bis, ERIC Other Clinician: Referring Physician: Caryl Bis, ERIC Treating Physician/Extender: Frann Rider in Treatment: 11 Visit Information History Since Last Visit Added or deleted any medications: No Patient Arrived: Cane Any new allergies or adverse reactions: No Arrival Time: 14:16 Had a fall or experienced change in No Accompanied By: husband activities of daily living that may affect Transfer Assistance: None risk of falls: Patient Identification Verified: Yes Signs or symptoms of abuse/neglect since last No Secondary Verification Process Yes visito Completed: Hospitalized since last visit: No Patient Requires Transmission- No Has Dressing in Place as Prescribed: Yes Based Precautions: Pain Present Now: No Patient Has Alerts: Yes Patient Alerts: Patient on Blood Thinner lovenox BID DMII Electronic Signature(s) Signed: 03/18/2016 5:15:40 PM By: Gretta Cool, RN, BSN, Kim RN, BSN Entered By: Gretta Cool, RN, BSN, Kim on 03/18/2016 14:16:20 [name] ([id number]) -------------------------------------------------------------------------------- Clinic Level of Care Assessment Details Patient Name: [name] Date of Service: 03/18/2016 2:15 PM Medical Record Number: [id number] Patient Account Number: [id number] Date of Birth/Sex: 1945/12/26 (70 y.o. Female) Treating RN: Cornell Barman Primary Care Physician: Caryl Bis, ERIC Other Clinician: Referring Physician: Caryl Bis, ERIC Treating Physician/Extender: Frann Rider in Treatment: 11 Clinic Level of Care Assessment Items TOOL 4 Quantity Score []  - Use when only an EandM is  performed on FOLLOW-UP visit 0 ASSESSMENTS - Nursing Assessment / Reassessment []  - Reassessment of Co-morbidities (includes updates in patient status) 0 X - Reassessment of Adherence to Treatment Plan 1 5 ASSESSMENTS - Wound and Skin Assessment / Reassessment X - Simple Wound Assessment / Reassessment - one wound 1 5 []  - Complex Wound Assessment / Reassessment - multiple wounds 0 []  - Dermatologic / Skin Assessment (not related to wound area) 0 ASSESSMENTS - Focused Assessment []  - Circumferential Edema Measurements - multi extremities 0 []  - Nutritional Assessment / Counseling / Intervention 0 []  - Lower Extremity Assessment (monofilament, tuning fork, pulses) 0 []  - Peripheral Arterial Disease Assessment (using hand held doppler) 0 ASSESSMENTS - Ostomy and/or Continence Assessment and Care []  - Incontinence Assessment and Management 0 []  - Ostomy Care Assessment and Management (repouching, etc.) 0 PROCESS - Coordination of Care X - Simple Patient / Family Education for ongoing care 1 15 []  - Complex (extensive) Patient / Family Education for ongoing care 0 []  - Staff obtains Programmer, systems, Records, Test Results / Process Orders 0 []  - Staff telephones HHA, Nursing Homes / Clarify orders / etc 0 []  - Routine Transfer to another Facility (non-emergent condition) 0 [name], [name] ([id number]) []  - Routine Hospital Admission (non-emergent condition) 0 []  - New Admissions / Biomedical engineer / Ordering NPWT, Apligraf, etc. 0 []  - Emergency Hospital Admission (emergent condition) 0 X - Simple Discharge Coordination 1 10 []  - Complex (extensive) Discharge Coordination 0 PROCESS - Special Needs []  - Pediatric / Minor Patient Management 0 []  - Isolation Patient Management 0 []  - Hearing / Language / Visual special needs 0 []  - Assessment of Community assistance (transportation, D/C planning, etc.) 0 []  - Additional assistance / Altered mentation 0 []  - Support Surface(s) Assessment  (bed, cushion, seat, etc.) 0 INTERVENTIONS - Wound Cleansing / Measurement X - Simple Wound Cleansing - one wound 1 5 []  - Complex Wound Cleansing - multiple wounds 0  X - Wound Imaging (photographs - any number of wounds) 1 5 []  - Wound Tracing (instead of photographs) 0 X - Simple Wound Measurement - one wound 1 5 []  - Complex Wound Measurement - multiple wounds 0 INTERVENTIONS - Wound Dressings X - Small Wound Dressing one or multiple wounds 1 10 []  - Medium Wound Dressing one or multiple wounds 0 []  - Large Wound Dressing one or multiple wounds 0 []  - Application of Medications - topical 0 []  - Application of Medications - injection 0 INTERVENTIONS - Miscellaneous []  - External ear exam 0 [name], [name]. ([id number]) []  - Specimen Collection (cultures, biopsies, blood, body fluids, etc.) 0 []  - Specimen(s) / Culture(s) sent or taken to Lab for analysis 0 []  - Patient Transfer (multiple staff / Harrel Lemon Lift / Similar devices) 0 []  - Simple Staple / Suture removal (25 or less) 0 []  - Complex Staple / Suture removal (26 or more) 0 []  - Hypo / Hyperglycemic Management (close monitor of Blood Glucose) 0 []  - Ankle / Brachial Index (ABI) - do not check if billed separately 0 X - Vital Signs 1 5 Has the patient been seen at the hospital within the last three years: Yes Total Score: 65 Level Of Care: New/Established - Level 2 Electronic Signature(s) Signed: 03/18/2016 5:15:40 PM By: Gretta Cool, RN, BSN, Kim RN, BSN Entered By: Gretta Cool, RN, BSN, Kim on 03/18/2016 14:50:07 [name] ([id number]) -------------------------------------------------------------------------------- Encounter Discharge Information Details Patient Name: [name] Date of Service: 03/18/2016 2:15 PM Medical Record Number: [id number] Patient Account Number: [id number] Date of Birth/Sex: 1945/11/13 (70 y.o. Female) Treating RN: Cornell Barman Primary Care Physician: Caryl Bis, ERIC Other Clinician: Referring  Physician: Caryl Bis, ERIC Treating Physician/Extender: Frann Rider in Treatment: 11 Encounter Discharge Information Items Discharge Pain Level: 0 Discharge Condition: Stable Ambulatory Status: Cane Discharge Destination: Home Transportation: Private Auto Accompanied By: self Schedule Follow-up Appointment: Yes Medication Reconciliation completed and provided to Patient/Care Yes Zaya Kessenich: Provided on Clinical Summary of Care: 03/18/2016 Form Type Recipient Paper Patient SR Electronic Signature(s) Signed: 03/18/2016 5:15:40 PM By: Gretta Cool, RN, BSN, Kim RN, BSN Previous Signature: 03/18/2016 2:50:03 PM Version By: Ruthine Dose Entered By: Gretta Cool RN, BSN, Kim on 03/18/2016 14:52:07 [name] ([id number]) -------------------------------------------------------------------------------- General Visit Notes Details Patient Name: [name] Date of Service: 03/18/2016 2:15 PM Medical Record Number: [id number] Patient Account Number: [id number] Date of Birth/Sex: 1946-02-04 (70 y.o. Female) Treating RN: Cornell Barman Primary Care Physician: Caryl Bis, ERIC Other Clinician: Referring Physician: Caryl Bis, ERIC Treating Physician/Extender: Frann Rider in Treatment: 11 Notes Patient starts chemo for ovarian cancer on 03/21/16. Electronic Signature(s) Signed: 03/18/2016 5:15:40 PM By: Gretta Cool, RN, BSN, Kim RN, BSN Entered By: Gretta Cool, RN, BSN, Kim on 03/18/2016 15:02:20 [name] ([id number]) -------------------------------------------------------------------------------- Lower Extremity Assessment Details Patient Name: [name] Date of Service: 03/18/2016 2:15 PM Medical Record Number: [id number] Patient Account Number: [id number] Date of Birth/Sex: 1945/12/10 (70 y.o. Female) Treating RN: Cornell Barman Primary Care Physician: Caryl Bis, ERIC Other Clinician: Referring Physician: Caryl Bis, ERIC Treating Physician/Extender: Frann Rider in Treatment: 11 Vascular Assessment Pulses: Posterior Tibial Dorsalis Pedis Palpable: [Left:Yes] Extremity colors, hair growth, and conditions: Extremity Color: [Left:Normal] Hair Growth on Extremity: [Left:No] Temperature of Extremity: [Left:Cool] Capillary Refill: [Left:< 3 seconds] Dependent Rubor: [Left:No] Blanched when Elevated: [Left:No] Lipodermatosclerosis: [Left:No] Toe Nail Assessment Left: Right: Thick: No Discolored: No Deformed: No Improper Length and Hygiene: No Electronic Signature(s) Signed: 03/18/2016 5:15:40 PM By: Gretta Cool, RN, BSN, Kim RN, BSN Entered By:  Gretta Cool, RN, BSN, Kim on 03/18/2016 14:20:34 Bauer, [name] ([id number]) -------------------------------------------------------------------------------- Multi Wound Chart Details Patient Name: PUANANI, SCHARRER Date of Service: 03/18/2016 2:15 PM Medical Record Number: [id number] Patient Account Number: [id number] Date of Birth/Sex: [birth year] (70 y.o. Female) Treating RN: Cornell Barman Primary Care Physician: Caryl Bis, ERIC Other Clinician: Referring Physician: Caryl Bis, ERIC Treating Physician/Extender: Frann Rider in Treatment: 11 Vital Signs Height(in): 63 Pulse(bpm): 78 Weight(lbs): 130 Blood Pressure 124/68 (mmHg): Body Mass Index(BMI): 23 Temperature(F): Respiratory Rate 16 (breaths/min): Photos: [N/A:N/A] Wound Location: Left Calcaneus N/A N/A Wounding Event: Pressure Injury N/A N/A Primary Etiology: Pressure Ulcer N/A N/A Secondary Etiology: Diabetic Wound/Ulcer of N/A N/A the Lower Extremity Comorbid History: Cataracts, Type I N/A N/A Diabetes, Rheumatoid Arthritis, Osteoarthritis, Neuropathy, Received Chemotherapy Date Acquired: 07/15/2015 N/A N/A Weeks of Treatment: 11 N/A N/A Wound Status: Open N/A N/A Measurements L x W x D 0.4x0.5x0.8 N/A N/A (cm) Area (cm) : 0.157 N/A N/A Volume (cm) : 0.126 N/A N/A % Reduction in Area: 33.50% N/A N/A %  Reduction in Volume: -34.00% N/A N/A Starting Position 1 5 (o'clock): Ending Position 1 8 (o'clock): Maximum Distance 1 1 (cm): [name], FELLA. ([id number]) Undermining: Yes N/A N/A Classification: Category/Stage III N/A N/A HBO Classification: Grade 1 N/A N/A Exudate Amount: Medium N/A N/A Exudate Type: Serous N/A N/A Exudate Color: amber N/A N/A Wound Margin: Flat and Intact N/A N/A Granulation Amount: None Present (0%) N/A N/A Necrotic Amount: Medium (34-66%) N/A N/A Epithelialization: None N/A N/A Periwound Skin Texture: Induration: Yes N/A N/A Edema: No Excoriation: No Callus: No Crepitus: No Fluctuance: No Friable: No Rash: No Scarring: No Periwound Skin Moist: Yes N/A N/A Moisture: Maceration: No Dry/Scaly: No Periwound Skin Color: Atrophie Blanche: No N/A N/A Cyanosis: No Ecchymosis: No Erythema: No Hemosiderin Staining: No Mottled: No Pallor: No Rubor: No Temperature: No Abnormality N/A N/A Tenderness on Yes N/A N/A Palpation: Wound Preparation: Ulcer Cleansing: N/A N/A Rinsed/Irrigated with Saline Topical Anesthetic Applied: Other: lidocaine 4% Assessment Notes: Unable to visualize id any N/A N/A structures are exposed. Treatment Notes Electronic Signature(s) Signed: 03/18/2016 5:15:40 PM By: Gretta Cool, RN, BSN, Kim RN, BSN Entered By: Gretta Cool, RN, BSN, Kim on 03/18/2016 [id number] [name], [name] ([id number], [name] ([id number]) -------------------------------------------------------------------------------- [place] Details Patient Name: [name], [name] Date of Service: 03/18/2016 2:15 PM Medical Record Number: [id number] Patient Account Number: [id number] Date of Birth/Sex: [birth year] (70 y.o. Female) Treating RN: Cornell Barman Primary Care Physician: Caryl Bis, ERIC Other Clinician: Referring Physician: Caryl Bis, ERIC Treating Physician/Extender: Frann Rider in Treatment: 11 Active Inactive Orientation  to the Wound Care Program Nursing Diagnoses: Knowledge deficit related to the wound healing center program Goals: Patient/caregiver will verbalize understanding of the [place] Program Date Initiated: 12/31/2015 Goal Status: Active Interventions: Provide education on orientation to the wound center Notes: Pressure Nursing Diagnoses: Knowledge deficit related to causes and risk factors for pressure ulcer development Knowledge deficit related to management of pressures ulcers Potential for impaired tissue integrity related to pressure, friction, moisture, and shear Goals: Patient will remain free from development of additional pressure ulcers Date Initiated: 12/31/2015 Goal Status: Active Patient will remain free of pressure ulcers Date Initiated: 12/31/2015 Goal Status: Active Patient/caregiver will verbalize risk factors for pressure ulcer development Date Initiated: 12/31/2015 Goal Status: Active Patient/caregiver will verbalize understanding of pressure ulcer management Date Initiated: 12/31/2015 Goal Status: Active Interventions: [name], [name] ([id number]) Assess: immobility, friction, shearing, incontinence upon admission and as needed Assess offloading mechanisms upon admission and as  needed Assess potential for pressure ulcer upon admission and as needed Provide education on pressure ulcers Treatment Activities: Patient referred for pressure reduction/relief devices : 12/31/2015 Notes: Wound/Skin Impairment Nursing Diagnoses: Impaired tissue integrity Knowledge deficit related to smoking impact on wound healing Knowledge deficit related to ulceration/compromised skin integrity Goals: Patient/caregiver will verbalize understanding of skin care regimen Date Initiated: 12/31/2015 Goal Status: Active Ulcer/skin breakdown will have a volume reduction of 30% by week 4 Date Initiated: 12/31/2015 Goal Status: Active Ulcer/skin breakdown will have a volume  reduction of 50% by week 8 Date Initiated: 12/31/2015 Goal Status: Active Ulcer/skin breakdown will have a volume reduction of 80% by week 12 Date Initiated: 12/31/2015 Goal Status: Active Ulcer/skin breakdown will heal within 14 weeks Date Initiated: 12/31/2015 Goal Status: Active Interventions: Assess patient/caregiver ability to obtain necessary supplies Assess patient/caregiver ability to perform ulcer/skin care regimen upon admission and as needed Assess ulceration(s) every visit Provide education on ulcer and skin care Treatment Activities: Skin care regimen initiated : 12/31/2015 Topical wound management initiated : 12/31/2015 Notes: [name], [name] ([id number]) Electronic Signature(s) Signed: 03/18/2016 5:15:40 PM By: Gretta Cool, RN, BSN, Kim RN, BSN Entered By: Gretta Cool, RN, BSN, Kim on 03/18/2016 14:28:21 [name] ([id number]) -------------------------------------------------------------------------------- Pain Assessment Details Patient Name: [name] Date of Service: 03/18/2016 2:15 PM Medical Record Number: [id number] Patient Account Number: [id number] Date of Birth/Sex: 12-24-1945 (70 y.o. Female) Treating RN: Cornell Barman Primary Care Physician: Caryl Bis, ERIC Other Clinician: Referring Physician: Caryl Bis, ERIC Treating Physician/Extender: Frann Rider in Treatment: 11 Active Problems Location of Pain Severity and Description of Pain Patient Has Paino No Site Locations With Dressing Change: No Pain Management and Medication Current Pain Management: Electronic Signature(s) Signed: 03/18/2016 5:15:40 PM By: Gretta Cool, RN, BSN, Kim RN, BSN Entered By: Gretta Cool, RN, BSN, Kim on 03/18/2016 14:16:27 [name] ([id number]) -------------------------------------------------------------------------------- Patient/Caregiver Education Details Patient Name: [name] Date of Service: 03/18/2016 2:15 PM Medical Record Number: [id number] Patient  Account Number: [id number] Date of Birth/Gender: 06-13-1945 (70 y.o. Female) Treating RN: Cornell Barman Primary Care Physician: Caryl Bis, ERIC Other Clinician: Referring Physician: Caryl Bis, ERIC Treating Physician/Extender: Frann Rider in Treatment: 11 Education Assessment Education Provided To: Patient Education Topics Provided Wound/Skin Impairment: Handouts: Caring for Your Ulcer Methods: Demonstration Responses: State content correctly Electronic Signature(s) Signed: 03/18/2016 5:15:40 PM By: Gretta Cool, RN, BSN, Kim RN, BSN Entered By: Gretta Cool, RN, BSN, Kim on 03/18/2016 14:52:20 [name] ([id number]) -------------------------------------------------------------------------------- Wound Assessment Details Patient Name: [name] Date of Service: 03/18/2016 2:15 PM Medical Record Number: [id number] Patient Account Number: [id number] Date of Birth/Sex: 02-09-1946 (70 y.o. Female) Treating RN: Cornell Barman Primary Care Physician: Caryl Bis, ERIC Other Clinician: Referring Physician: Caryl Bis, ERIC Treating Physician/Extender: Frann Rider in Treatment: 11 Wound Status Wound Number: 2 Primary Pressure Ulcer Etiology: Wound Location: Left Calcaneus Secondary Diabetic Wound/Ulcer of the Lower Wounding Event: Pressure Injury Etiology: Extremity Date Acquired: 07/15/2015 Wound Open Weeks Of Treatment: 11 Status: Clustered Wound: No Comorbid Cataracts, Type I Diabetes, History: Rheumatoid Arthritis, Osteoarthritis, Neuropathy, Received Chemotherapy Photos Wound Measurements Length: (cm) 0.4 Width: (cm) 0.5 Depth: (cm) 0.8 Area: (cm) 0.157 Volume: (cm) 0.126 % Reduction in Area: 33.5% % Reduction in Volume: -34% Epithelialization: None Tunneling: No Undermining: Yes Starting Position (o'clock): 5 Ending Position (o'clock): 8 Maximum Distance: (cm) 1 Wound Description Classification: Category/Stage III Diabetic Severity Janet Newport):  Grade 1 Wound Margin: Flat and Intact Exudate Amount: Medium Exudate Type: Serous Exudate Color: amber Foul Odor After Cleansing: No Wound Bed Eakins,  [name] ([id number]) Granulation Amount: None Present (0%) Necrotic Amount: Medium (34-66%) Necrotic Quality: Adherent Slough Periwound Skin Texture Texture Color No Abnormalities Noted: No No Abnormalities Noted: No Callus: No Atrophie Blanche: No Crepitus: No Cyanosis: No Excoriation: No Ecchymosis: No Fluctuance: No Erythema: No Friable: No Hemosiderin Staining: No Induration: Yes Mottled: No Localized Edema: No Pallor: No Rash: No Rubor: No Scarring: No Temperature / Pain Moisture Temperature: No Abnormality No Abnormalities Noted: No Tenderness on Palpation: Yes Dry / Scaly: No Maceration: No Moist: Yes Wound Preparation Ulcer Cleansing: Rinsed/Irrigated with Saline Topical Anesthetic Applied: Other: lidocaine 4%, Assessment Notes Unable to visualize id any structures are exposed. Treatment Notes Wound #2 (Left Calcaneus) 1. Cleansed with: Clean wound with Normal Saline 2. Anesthetic Topical Lidocaine 4% cream to wound bed prior to debridement 4. Dressing Applied: Other dressing (specify in notes) 5. Secondary Dressing Applied Bordered Foam Dressing Notes polymem ag Electronic Signature(s) Signed: 03/18/2016 5:15:40 PM By: Gretta Cool, RN, BSN, Kim RN, BSN Entered By: Gretta Cool, RN, BSN, Kim on 03/18/2016 14:25:52 Ybanez, [name] ([id number]) -------------------------------------------------------------------------------- Vitals Details Patient Name: [name] Date of Service: 03/18/2016 2:15 PM Medical Record Number: [id number] Patient Account Number: [id number] Date of Birth/Sex: 1945/06/03 (70 y.o. Female) Treating RN: Cornell Barman Primary Care Physician: Caryl Bis, ERIC Other Clinician: Referring Physician: Caryl Bis, ERIC Treating Physician/Extender: Frann Rider in Treatment:  11 Vital Signs Time Taken: 14:16 Pulse (bpm): 78 Height (in): 63 Respiratory Rate (breaths/min): 16 Weight (lbs): 130 Blood Pressure (mmHg): 124/68 Body Mass Index (BMI): 23 Reference Range: 80 - 120 mg / dl Electronic Signature(s) Signed: 03/18/2016 5:15:40 PM By: Gretta Cool, RN, BSN, Kim RN, BSN Entered By: Gretta Cool, RN, BSN, Kim on 03/18/2016 14:16:46

## 2016-03-20 NOTE — Progress Notes (Signed)
[facility] day:  03/21/2016  Chief Complaint: [name] is a 70 y.o. female with stage IV ovarian cancer who is seen for assessment prior to cycle #1 carboplatin and gemcitabine.  HPI:  The patient was last seen in the medical oncology clinic on 03/07/2016 for a sick call visit.  She noted abdominal pain that was localized to the left upper quadrant.  Urinalysis and culture were negative.  She had been on tamoxifen for 3 weeks.  Abdomen and pelvic CT scan on 03/08/2016 revealed mild progression of peritoneal disease in the abdomen.  There was interval increase in loculated fluid around the lateral segment left liver, stomach, and spleen.  There was persistent soft tissue lesion at the level of the ampulla with mild intra and extrahepatic biliary duct dilatation.  There was persistent intrahepatic and capsular metastatic disease involving the liver.  She was contacted regarding these results. As she had progressive disease, tamoxifen was discontinued.  She states that she took her last dose about 1-1/2 weeks ago.  In anticipation of chemotherapy, she stopped her methotrexate about a week ago.   Symptomatically, she feels good. She has some tightness under her breasts or upper abdomen area.  Pain has gone away. She notes more fatigue.   Past Medical History:  Diagnosis Date  . CAD (coronary artery disease)    a. 09/2012 Cath: LM nl, LAD 95p, 95m LCX 982mOM2 50, RCA 100.  . Marland Kitchenholelithiasis   . Chronic diastolic CHF (congestive heart failure) (HC[place]  . Collagen vascular disease ([facility]  . Esophageal stricture   . Exertional shortness of breath   . GERD (gastroesophageal reflux disease)   . H/O hiatal hernia   . Herniated disc   . History of pancytopenia   . Hyperlipidemia   . Hypertension   . Hypokalemia   . Leukopenia 2012   s/p bone marrow biopsy, Dr. PaMa Hillock. NSTEMI (non-ST elevated myocardial infarction) (HCP[place]irie[place]5/2014   "mild"  (10/18/2012)  . Ovarian cancer (HCP[facility] [place]2016   chemo  . Pneumonia 2013; 08/2012   "one lung; double" (10/18/2012)  . Rheumatoid arthritis(714.0)   . Type I diabetes mellitus (HCA[place]   "dx'd in 1957" (10/18/2012)    Past Surgical History:  Procedure Laterality Date  . ABDOMINAL HYSTERECTOMY  06/15/2015   Dx L/S, EXLAP TAH BSO omentectomy RSRx colostomy diaphragm resection stripping  . CARDIAC CATHETERIZATION  10/18/2012   "first one was today" (10/18/2012)  . CATARACT EXTRACTION W/ INTRAOCULAR LENS IMPLANT Right 2010  . CHOLECYSTECTOMY  06/15/2015   combined case with ovarian cancer debulking  . COLON SURGERY    . CORONARY ARTERY BYPASS GRAFT N/A 10/19/2012   Procedure: CORONARY ARTERY BYPASS GRAFTING (CABG);  Surgeon: StMelrose NakayamaMD;  [place] [facility] [place] Service: Open Heart Surgery;  Laterality: N/A;  . ESOPHAGEAL DILATION     "3 or 4 times" (10/19/2011)  . ESOPHAGOGASTRODUODENOSCOPY  2012   Dr. IfMinna Merritts. OSTOMY    . OVARY SURGERY     removal  . PERIPHERAL VASCULAR CATHETERIZATION N/A 03/02/2015   Procedure: PoGlori Luisath Insertion;  Surgeon: JaAlgernon HuxleyMD;  [facility];  Service: Cardiovascular;  Laterality: N/A;  . TUBAL LIGATION  1970    Family History  Problem Relation Age of Onset  . Diabetes Mother   . Arthritis Mother   . Diabetes Father   . Arthritis Father   . Bone cancer  Sister     Social History:  reports that she quit smoking about 21 years ago. Her smoking use included Cigarettes. She has a 30.00 pack-year smoking history. She has never used smokeless tobacco. She reports that she does not drink alcohol or use drugs.    Allergies:  Allergies  Allergen Reactions  . Codeine Nausea And Vomiting  . Latex Rash    Current Medications: Current Outpatient Prescriptions  Medication Sig Dispense Refill  . Calcium-Vitamin D 600-200 MG-UNIT tablet Take 2 tablets by mouth daily.     Marland Kitchen enoxaparin (LOVENOX) 60 MG/0.6ML injection Inject 0.6 mLs (60 mg  total) into the skin every 12 (twelve) hours. Held last pm and this am dose for bone marrow biopsy 60 Syringe 0  . folic acid (FOLVITE) 1 MG tablet TAKE 1 TABLET BY MOUTH ONCE A DAY 90 tablet 3  . furosemide (LASIX) 80 MG tablet Take 80 mg by mouth daily as needed.     . Insulin Human (INSULIN PUMP) SOLN Pt uses Humalog insulin.    Marland Kitchen insulin lispro (HUMALOG) 100 UNIT/ML injection Inject 0.06 mLs (6 Units total) into the skin daily. 30 mL 3  . lidocaine-prilocaine (EMLA) cream Apply 1 application topically as needed (prior to accessing port).    . loratadine (CLARITIN) 10 MG tablet Take 1 tablet by mouth daily as needed.    Marland Kitchen losartan (COZAAR) 100 MG tablet Take 1 tablet (100 mg total) by mouth daily. 90 tablet 1  . metoprolol tartrate (LOPRESSOR) 25 MG tablet TAKE 1 TABLET BY MOUTH TWICE A DAY AS NEEDED 180 tablet 3  . ondansetron (ZOFRAN) 8 MG tablet Take 1 tablet (8 mg total) by mouth every 8 (eight) hours as needed for nausea or vomiting. 20 tablet 1  . ONE TOUCH ULTRA TEST test strip     . oxyCODONE (OXY IR/ROXICODONE) 5 MG immediate release tablet Take 5 mg by mouth every 6 (six) hours as needed. Reported on 11/17/2015    . pantoprazole (PROTONIX) 40 MG tablet TAKE 1 TABLET BY MOUTH TWICE A DAY 180 tablet 1  . potassium chloride SA (K-DUR,KLOR-CON) 20 MEQ tablet Take 2 tablets (40 mEq total) by mouth daily. 60 tablet 3  . ranitidine (ZANTAC) 150 MG tablet Take 1 tablet (150 mg total) by mouth 2 (two) times daily. (Patient taking differently: Take 150 mg by mouth 2 (two) times daily as needed. ) 180 tablet 2  . vitamin C (ASCORBIC ACID) 500 MG tablet Take 500 mg by mouth daily.    Marland Kitchen zinc gluconate 50 MG tablet Take 50 mg by mouth daily.    Marland Kitchen amLODipine (NORVASC) 5 MG tablet Take by mouth.    . methotrexate (RHEUMATREX) 2.5 MG tablet Take by mouth.    . predniSONE (DELTASONE) 5 MG tablet Take 5 mg by mouth as needed. Reported on 09/17/2015    . tamoxifen (NOLVADEX) 20 MG tablet Take 1 tablet (20  mg total) by mouth daily. (Patient not taking: Reported on 03/21/2016) 30 tablet 1   No current facility-administered medications for this visit.    Facility-Administered Medications Ordered in Other Visits  Medication Dose Route Frequency Provider Last Rate Last Dose  . sodium chloride 0.9 % injection 10 mL  10 mL Intracatheter PRN Lequita Asal, MD      . sodium chloride 0.9 % injection 10 mL  10 mL Intravenous PRN Lequita Asal, MD   10 mL at 04/13/15 6269    Review of Systems:  GENERAL:  Feels good, but more fatigue.  No fever, chills or sweats.  Weight up 3 pounds. PERFORMANCE STATUS (ECOG):  1 HEENT:  No visual changes, sore throat, mouth sores or tenderness. Lungs:  No shortness of breath or cough.  No hemoptysis. Cardiac:  No chest pain, palpitations, orthopnea, or PND. GI:  Upper abdominal discomfort/fullness.  No constipation, diarrhea, melena or hematochezia. GU:  No urgency, frequency, dysuria, or hematuria. Musculoskeletal:  Severe rheumatoid arthritis (MTX discontinued).  Osteoporosis.  No back pain. No muscle tenderness. Extremities:  No pain or swelling. Skin:  No rashes or skin changes. Neuro:  Neuropathy (stable).  No headache, numbness or weakness, balance or coordination issues. Endocrine:  Diabetes on an insulin pump.  No thyroid issues, hot flashes or night sweats. Psych:  No mood changes, depression or anxiety. Pain: No pain. Review of systems:  All other systems reviewed and found to be negative.  Physical Exam: Blood pressure 112/66, pulse 77, temperature 97.1 F (36.2 C), temperature source Tympanic, resp. rate 18, weight 141 lb 5 oz (64.1 kg). GENERAL:  Well developed, well nourished woman sitting comfortably in the exam room in no acute distress. MENTAL STATUS:  Alert and oriented to person, place and time. HEAD:  Curly gray hair.  Normocephalic, atraumatic, face symmetric, no Cushingoid features. EYES:  Gold rimmed glasses.  Blue eyes.  No  conjunctivitis or scleral icterus.  ENT: Oropharynx clear without lesion. Tongue normal. Mucous membranes moist.  RESPIRATORY: Clear to auscultation without rales, wheezes or rhonchi. CARDIOVASCULAR: Regular rate and rhythm without murmur, rub or gallop. ABDOMEN: Soft, non-tender with active bowel sounds and no appreciable hepatosplenomegaly. No palpable nodularity or masses. SKIN: No rashes, ulcers, or bruises. EXTREMITIES:  No edema, skin discoloration or tenderness. No palpable cords. LYMPH NODES: No palpable cervical, supraclavicular, axillary or inguinal adenopathy  NEUROLOGICAL: Appropriate. PSYCH: Appropriate.   Infusion on 03/21/2016  Component Date Value Ref Range Status  . WBC 03/21/2016 2.5* 3.6 - 11.0 K/uL Final  . RBC 03/21/2016 3.65* 3.80 - 5.20 MIL/uL Final  . Hemoglobin 03/21/2016 10.8* 12.0 - 16.0 g/dL Final  . HCT 03/21/2016 31.3* 35.0 - 47.0 % Final  . MCV 03/21/2016 86.0  80.0 - 100.0 fL Final  . MCH 03/21/2016 29.6  26.0 - 34.0 pg Final  . MCHC 03/21/2016 34.5  32.0 - 36.0 g/dL Final  . RDW 03/21/2016 15.4* 11.5 - 14.5 % Final  . Platelets 03/21/2016 234  150 - 440 K/uL Final  . Neutrophils Relative % 03/21/2016 53  % Final  . Neutro Abs 03/21/2016 1.3* 1.4 - 6.5 K/uL Final  . Lymphocytes Relative 03/21/2016 28  % Final  . Lymphs Abs 03/21/2016 0.7* 1.0 - 3.6 K/uL Final  . Monocytes Relative 03/21/2016 16  % Final  . Monocytes Absolute 03/21/2016 0.4  0.2 - 0.9 K/uL Final  . Eosinophils Relative 03/21/2016 2  % Final  . Eosinophils Absolute 03/21/2016 0.0  0 - 0.7 K/uL Final  . Basophils Relative 03/21/2016 1  % Final  . Basophils Absolute 03/21/2016 0.0  0 - 0.1 K/uL Final  . Sodium 03/21/2016 136  135 - 145 mmol/L Final  . Potassium 03/21/2016 4.3  3.5 - 5.1 mmol/L Final  . Chloride 03/21/2016 104  101 - 111 mmol/L Final  . CO2 03/21/2016 29  22 - 32 mmol/L Final  . Glucose, Bld 03/21/2016 174* 65 - 99 mg/dL Final  . BUN 03/21/2016 19  6 - 20  mg/dL Final  . Creatinine, Ser 03/21/2016 0.86  0.44 - 1.00 mg/dL Final  . Calcium 03/21/2016 8.5* 8.9 - 10.3 mg/dL Final  . Total Protein 03/21/2016 6.5  6.5 - 8.1 g/dL Final  . Albumin 03/21/2016 3.0* 3.5 - 5.0 g/dL Final  . AST 03/21/2016 20  15 - 41 U/L Final  . ALT 03/21/2016 13* 14 - 54 U/L Final  . Alkaline Phosphatase 03/21/2016 65  38 - 126 U/L Final  . Total Bilirubin 03/21/2016 0.4  0.3 - 1.2 mg/dL Final  . GFR calc non Af Amer 03/21/2016 >60  >60 mL/min Final  . GFR calc Af Amer 03/21/2016 >60  >60 mL/min Final   Comment: (NOTE) The eGFR has been calculated using the CKD EPI equation. This calculation has not been validated in all clinical situations. eGFR's persistently <60 mL/min signify possible Chronic Kidney Disease.   Georgiann Hahn gap 03/21/2016 3* 5 - 15 Final    Assessment:  TARI LECOUNT is a 70 y.o. female with stage IV ovarian cancer.  She presented with abdominal discomfort and bloating.  Omental biopsy on 02/23/2015 revealed metastatic high grade serous carcinoma, consistent with gynecologic origin.   She was initially diagnosed with clinical stage IIIC (T3cN1Mx).  CA125 was 707 on 02/17/2015.  Abdomen and pelvic CT scan on 02/13/2015 revealed bilateral mass-like adnexal regions (right adnexal mass 5.6 x 5.0 cm and the left adnexa mass 10.3 x 6.0 x 9.9 cm).  There was a large amount of soft tissue throughout the peritoneal cavity involving the omentum and other peritoneal surfaces.  There was a small volume ascites. There was a peripheral 3.3 x 1.9 cm low-attenuation lesion overlying the right lobe of the liver , likely representing a serosal implant. There was 1.4 x 2.2 cm ill-defined peripheral lesion within the inferior aspect of segment 6 adjacent to the ampulla in the duodenum.   She received 4 cycles of neoadjuvant carboplatin and Taxol (03/05/2015 - 05/22/2015).  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  She had loose stools on oral magnesium.  She was  initially on Neurontin then switched Lyrica with cycle #3.  Cycle #4 was notable for neutropenia (ANC 300) requiring GCSF x 3 days.    CA125 was 802.9 on 03/30/2015, 567.9 on 04/13/2015, 168.8 on 05/15/2015, 85.2 on 07/17/2015, 68.6 on 07/28/2015, 34.5 on 09/17/2015, 20.7 on 11/06/2015, 49 on 01/22/2016, 106.1 on 02/22/2016, 138.8 on 03/07/2016, and 220.8 on 03/21/2016.  Abdominal and pelvic CT scan on 04/28/2015 revealed decreasing bilateral ovarian masses.   The left adnexal mass measured 4.0 x 6.1 cm (previously 5.0 x 8.5 cm). The right ovary measured 3.8 x 4.9 cm (previously 4.7 x 5.6 cm).  There was improved peritoneal carcinomatosis.  There was a small amount of ascites.  The hepatic dome lesion was stable. The previously seen right hepatic lobe lesion was not well-visualized.  There was a small left pleural effusion and trace right pleural effusion.  The nodular lesion at the ampulla of Vater, extending into the duodenum was stable.  There was no biliary ductal dilatation.  She underwent exploratory laparotomy, lysis of adhesions, total abdominal hysterectomy with bilateral salpingo-oophorectomy, infracolic omentectomy, optimal tumor debulking(< 1 cm), recto-sigmoid resection with creation of end colostomy, cholecystectomy, mobilization of splenic flexure and liver with diaphragmatic stripping on 06/15/2015. The right diaphragm was cleared of tumor. During dissection, the diaphragm was entered and closed with sutures.   She has had a recurrent right sided pleural effusion.  She underwent thoracentesis of 650 cc on post-operative day 3.  She was  admitted to [facility] on 07/01/2015 and 07/07/2015 for recurrent shortness of breath.  She has undergone 2 additional thoracentesis (1.1 L on 07/02/2015 and 850 cc on 07/08/2015).  Cytology was negative x 2.  Bilateral lower extremity duplex on 07/03/2015 was negative.  Echo on 07/08/2015 revealed en EF of 55-60%.    She has severe rheumatoid arthritis.   Methotrexate and Enbrel were initially on hold.  She has a normocytic anemia.  Work-up on 02/17/2015 revealed a normal ferritin, B12, folate, TSH.  She denies any melena or hematochezia.    She has anemia likely due to chronic disease. She received 1 unit PRBCs during her admission at [facility]. She denies any melena or hematochezia. She has diabetes and is on an insulin pump.  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 She is on Lovenox 60 mg twice a day.   She was admitted to [facility] from 07/29/2015 - 08/10/2015 with a right-sided empyema and liver abscess. She underwent CT-guided placement of a liver abscess drain on 07/30/2015. Liver abscess culture grew out group B strep and Enterobacter which was sensitive to Zosyn. She was transitioned to ertapenem Colbert Ewing) prior to discharge.  She was readmitted to [facility] on 09/17/2015.  Chest, abdomen, and pelvic CT scan on 09/01/2015 revealed continued decrease in perihepatic fluid collection contiguous with the right pleural space with percutaneous drain. Drain was removed on 09/01/2015.  Chest, abdomen, and pelvic CT scan on 09/11/2015 revealed a residual versus recurrent fluid collection posterior to the right hepatic lobe (2.6 x 1.1 x 4.0 cm).  Chest, abdomen, and pelvic CT scan on 10/13/2015 revealed resolution of the empyema.    She has had persistent neutropenia felt secondary to her rheumatoid arthritis.  Folate and MMA were normal.  TSH was 6.13 (high) with a free T4 of 1.17 (0.61-1.12).  She began methotrexate (10 mg a week) and prednisone (5 mg a day) for severe rheumatoid arthritis on 12/17/2015.  Counts are improving.  Bone marrow aspirate and biopsy on 06/09/2010 revealed a hypercellular marrow (70%) with no evidence of dysplasia or malignancy.  Flow cytometry was negative.  Cytogenetics were normal  (46,XX).  FISH studies were negative for MDS.  Bone marrow aspirate and biopsy on 12/03/2015 revealed a normocellular to mildy hypercellular marrow for age (40%) with left shifted myelopoiesis, non specific dyserythropoiesis and mild megakaryocytic atypia with no increase in blasts.  There were multiple small nonspecific lymphoid aggregates (favor reactive). There was no increase in reticulin.  There was decreased myeloid cells (37%) with left shifted maturation and 1% atypical myelod blasts.  There was relatively increased monocytic cells (11%), relatively increased lymphoid cells (36%), and relatively increased eosinophils (6%).  Cytogenetics were normal (68, XX).  SNP microarray was normal.  Alkaline phosphatase was 348 (38-126) on 11/06/2015 and 431 on 11/17/2015.   Fractionated alkaline phosphatase on 11/09/2015 revealed 21% bone and 79% liver.  MRI of the abdomen on 12/14/2015 revealed mild extrahepatic biliary duct dilatation without obstructing lesion identified.  Chest, abdomen, and pelvic CT scan on 02/15/2016 revealed multiple soft tissue nodules throughout the pelvis, small bowel mesenteric and peritoneum and, concerning for peritoneal metastatic disease.  There was soft tissue irregularity within the left upper quadrant.  There was mild right hydronephrosis (etiology unclear).  There was interval resolution of previously described right pleural based fluid and gas collection. There was a pleural based nodule  within the right lower hemi-thorax concerning for pleural based metastasis.  There were small bilateral pleural effusions.  There was pulmonary nodularity, predominately within the left upper lobe (metastatic or infectious/inflammatory etiology).  There was slightly increased mediastinal adenopathy (infectious/inflammatory or metastatic).  There was a low attenuation lesion within the left hepatic lobe (complicated fluid within the fissure or metastatic disease).  She was on tamoxifen from  02/25/2016 - 03/14/2016.  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  Abdomen and pelvic CT scan on 03/08/2016 revealed mild progression of peritoneal disease in the abdomen.  There was interval increase in loculated fluid around the lateral segment left liver, stomach, and spleen.  There was persistent soft tissue lesion at the level of the ampulla with mild intra and extrahepatic biliary duct dilatation.  There was persistent intrahepatic and capsular metastatic disease involving the liver.  Symptomatically, she feels fatigued.  She has upper abdominal fullness.  Counts are marginal at baseline secondary to rheumatoid arthritis.  Plan: 1.  Labs today:  CBC with diff, CMP, CA125. 2.  Review abdomen and pelvic CT scan. 3.  Discuss chemotherapy plan with carboplatin and gemcitabine (2 weeks on/1 week off).  Discuss issues with baseline marginal counts.  Her counts will likely be low next week without growth factor support.  Discssued option of daily G-CSF this week (truncated by holidays) then day 8 gemcitabine in 1 week followed by Neulasta on day 9.  I also discuss postponing chemotherapy for one week given the inability to give G-CSF throughout this week. As a third option, I discussed day 1 carboplatin and gemcitabine today followed by Neulasta on day 2. She would not receive day 8 treatment.  Next treatment would be in 3 weeks. Patient opted for treatment today followed by Neulasta tomorrow. I expect her counts will be good with her next cycle.  She consented to treatment. 4.  Discuss anticoagulation.  Discuss switch to Eliquis once we determine her sensitivity to gemcitabine (thrombocytopenia). 5.  Refill Lovenox. 6.  Cycle #1 gemcitabine and carboplatin today. 7.  RTC tomorrow for Neulasta. 8.  RTC on 03/31/2016 for MD assessment and labs (CBC with diff, BMP).     Lequita Asal, MD  03/21/2016

## 2016-03-21 ENCOUNTER — Inpatient Hospital Stay: Payer: Medicare Other

## 2016-03-21 ENCOUNTER — Encounter: Payer: Self-pay | Admitting: Hematology and Oncology

## 2016-03-21 ENCOUNTER — Inpatient Hospital Stay (HOSPITAL_BASED_OUTPATIENT_CLINIC_OR_DEPARTMENT_OTHER): Payer: Medicare Other | Admitting: Hematology and Oncology

## 2016-03-21 VITALS — BP 111/65 | HR 68 | Temp 97.0°F | Resp 18

## 2016-03-21 VITALS — BP 112/66 | HR 77 | Temp 97.1°F | Resp 18 | Wt 141.3 lb

## 2016-03-21 DIAGNOSIS — C7951 Secondary malignant neoplasm of bone: Secondary | ICD-10-CM | POA: Diagnosis not present

## 2016-03-21 DIAGNOSIS — C569 Malignant neoplasm of unspecified ovary: Secondary | ICD-10-CM

## 2016-03-21 DIAGNOSIS — I82C19 Acute embolism and thrombosis of unspecified internal jugular vein: Secondary | ICD-10-CM

## 2016-03-21 DIAGNOSIS — K219 Gastro-esophageal reflux disease without esophagitis: Secondary | ICD-10-CM

## 2016-03-21 DIAGNOSIS — D61818 Other pancytopenia: Secondary | ICD-10-CM

## 2016-03-21 DIAGNOSIS — E876 Hypokalemia: Secondary | ICD-10-CM

## 2016-03-21 DIAGNOSIS — Z7901 Long term (current) use of anticoagulants: Secondary | ICD-10-CM

## 2016-03-21 DIAGNOSIS — R232 Flushing: Secondary | ICD-10-CM

## 2016-03-21 DIAGNOSIS — Z9221 Personal history of antineoplastic chemotherapy: Secondary | ICD-10-CM

## 2016-03-21 DIAGNOSIS — M069 Rheumatoid arthritis, unspecified: Secondary | ICD-10-CM

## 2016-03-21 DIAGNOSIS — N1330 Unspecified hydronephrosis: Secondary | ICD-10-CM

## 2016-03-21 DIAGNOSIS — K449 Diaphragmatic hernia without obstruction or gangrene: Secondary | ICD-10-CM

## 2016-03-21 DIAGNOSIS — Z87891 Personal history of nicotine dependence: Secondary | ICD-10-CM

## 2016-03-21 DIAGNOSIS — M359 Systemic involvement of connective tissue, unspecified: Secondary | ICD-10-CM

## 2016-03-21 DIAGNOSIS — E108 Type 1 diabetes mellitus with unspecified complications: Secondary | ICD-10-CM

## 2016-03-21 DIAGNOSIS — C801 Malignant (primary) neoplasm, unspecified: Secondary | ICD-10-CM

## 2016-03-21 DIAGNOSIS — I509 Heart failure, unspecified: Secondary | ICD-10-CM

## 2016-03-21 DIAGNOSIS — Z90722 Acquired absence of ovaries, bilateral: Secondary | ICD-10-CM

## 2016-03-21 DIAGNOSIS — K8020 Calculus of gallbladder without cholecystitis without obstruction: Secondary | ICD-10-CM

## 2016-03-21 DIAGNOSIS — R0602 Shortness of breath: Secondary | ICD-10-CM

## 2016-03-21 DIAGNOSIS — C787 Secondary malignant neoplasm of liver and intrahepatic bile duct: Secondary | ICD-10-CM

## 2016-03-21 DIAGNOSIS — E785 Hyperlipidemia, unspecified: Secondary | ICD-10-CM

## 2016-03-21 DIAGNOSIS — Z79810 Long term (current) use of selective estrogen receptor modulators (SERMs): Secondary | ICD-10-CM

## 2016-03-21 DIAGNOSIS — I2510 Atherosclerotic heart disease of native coronary artery without angina pectoris: Secondary | ICD-10-CM

## 2016-03-21 DIAGNOSIS — Z9641 Presence of insulin pump (external) (internal): Secondary | ICD-10-CM

## 2016-03-21 DIAGNOSIS — I252 Old myocardial infarction: Secondary | ICD-10-CM

## 2016-03-21 DIAGNOSIS — C561 Malignant neoplasm of right ovary: Secondary | ICD-10-CM | POA: Diagnosis not present

## 2016-03-21 DIAGNOSIS — R188 Other ascites: Secondary | ICD-10-CM

## 2016-03-21 DIAGNOSIS — Z8701 Personal history of pneumonia (recurrent): Secondary | ICD-10-CM

## 2016-03-21 DIAGNOSIS — K222 Esophageal obstruction: Secondary | ICD-10-CM

## 2016-03-21 DIAGNOSIS — C786 Secondary malignant neoplasm of retroperitoneum and peritoneum: Secondary | ICD-10-CM

## 2016-03-21 DIAGNOSIS — Z808 Family history of malignant neoplasm of other organs or systems: Secondary | ICD-10-CM

## 2016-03-21 DIAGNOSIS — R109 Unspecified abdominal pain: Secondary | ICD-10-CM

## 2016-03-21 DIAGNOSIS — N644 Mastodynia: Secondary | ICD-10-CM

## 2016-03-21 DIAGNOSIS — Z794 Long term (current) use of insulin: Secondary | ICD-10-CM

## 2016-03-21 DIAGNOSIS — Z7689 Persons encountering health services in other specified circumstances: Secondary | ICD-10-CM | POA: Diagnosis not present

## 2016-03-21 DIAGNOSIS — Z5111 Encounter for antineoplastic chemotherapy: Secondary | ICD-10-CM

## 2016-03-21 DIAGNOSIS — I10 Essential (primary) hypertension: Secondary | ICD-10-CM

## 2016-03-21 DIAGNOSIS — I82C11 Acute embolism and thrombosis of right internal jugular vein: Secondary | ICD-10-CM

## 2016-03-21 DIAGNOSIS — Z9049 Acquired absence of other specified parts of digestive tract: Secondary | ICD-10-CM

## 2016-03-21 DIAGNOSIS — Z90710 Acquired absence of both cervix and uterus: Secondary | ICD-10-CM

## 2016-03-21 DIAGNOSIS — I5032 Chronic diastolic (congestive) heart failure: Secondary | ICD-10-CM

## 2016-03-21 DIAGNOSIS — D72819 Decreased white blood cell count, unspecified: Secondary | ICD-10-CM

## 2016-03-21 LAB — COMPREHENSIVE METABOLIC PANEL
ALT: 13 U/L — ABNORMAL LOW (ref 14–54)
AST: 20 U/L (ref 15–41)
Albumin: 3 g/dL — ABNORMAL LOW (ref 3.5–5.0)
Alkaline Phosphatase: 65 U/L (ref 38–126)
Anion gap: 3 — ABNORMAL LOW (ref 5–15)
BUN: 19 mg/dL (ref 6–20)
CO2: 29 mmol/L (ref 22–32)
Calcium: 8.5 mg/dL — ABNORMAL LOW (ref 8.9–10.3)
Chloride: 104 mmol/L (ref 101–111)
Creatinine, Ser: 0.86 mg/dL (ref 0.44–1.00)
GFR calc Af Amer: >60 mL/min (ref >60)
GFR calc non Af Amer: >60 mL/min (ref >60)
Glucose, Bld: 174 mg/dL — ABNORMAL HIGH (ref 65–99)
Potassium: 4.3 mmol/L (ref 3.5–5.1)
Sodium: 136 mmol/L (ref 135–145)
Total Bilirubin: 0.4 mg/dL (ref 0.3–1.2)
Total Protein: 6.5 g/dL (ref 6.5–8.1)

## 2016-03-21 LAB — CBC WITH DIFFERENTIAL/PLATELET
Basophils Absolute: 0 10*3/uL (ref 0–0.1)
Basophils Relative: 1 %
Eosinophils Absolute: 0 10*3/uL (ref 0–0.7)
Eosinophils Relative: 2 %
HCT: 31.3 % — ABNORMAL LOW (ref 35.0–47.0)
Hemoglobin: 10.8 g/dL — ABNORMAL LOW (ref 12.0–16.0)
Lymphocytes Relative: 28 %
Lymphs Abs: 0.7 10*3/uL — ABNORMAL LOW (ref 1.0–3.6)
MCH: 29.6 pg (ref 26.0–34.0)
MCHC: 34.5 g/dL (ref 32.0–36.0)
MCV: 86 fL (ref 80.0–100.0)
Monocytes Absolute: 0.4 10*3/uL (ref 0.2–0.9)
Monocytes Relative: 16 %
Neutro Abs: 1.3 10*3/uL — ABNORMAL LOW (ref 1.4–6.5)
Neutrophils Relative %: 53 %
Platelets: 234 10*3/uL (ref 150–440)
RBC: 3.65 MIL/uL — ABNORMAL LOW (ref 3.80–5.20)
RDW: 15.4 % — ABNORMAL HIGH (ref 11.5–14.5)
WBC: 2.5 10*3/uL — ABNORMAL LOW (ref 3.6–11.0)

## 2016-03-21 MED ORDER — SODIUM CHLORIDE 0.9 % IV SOLN
Freq: Once | INTRAVENOUS | Status: AC
  Administered 2016-03-21: 10:00:00 via INTRAVENOUS
  Filled 2016-03-21: qty 1000

## 2016-03-21 MED ORDER — SODIUM CHLORIDE 0.9 % IV SOLN
360.0000 mg | Freq: Once | INTRAVENOUS | Status: AC
  Administered 2016-03-21: 360 mg via INTRAVENOUS
  Filled 2016-03-21: qty 36

## 2016-03-21 MED ORDER — DEXAMETHASONE SODIUM PHOSPHATE 10 MG/ML IJ SOLN
10.0000 mg | Freq: Once | INTRAMUSCULAR | Status: AC
  Administered 2016-03-21: 10 mg via INTRAVENOUS
  Filled 2016-03-21: qty 1

## 2016-03-21 MED ORDER — SODIUM CHLORIDE 0.9% FLUSH
10.0000 mL | INTRAVENOUS | Status: DC | PRN
  Administered 2016-03-21: 10 mL
  Filled 2016-03-21: qty 10

## 2016-03-21 MED ORDER — ENOXAPARIN SODIUM 60 MG/0.6ML ~~LOC~~ SOLN: 60.0000 mg | Freq: Two times a day (BID) | SUBCUTANEOUS | 0 refills | Status: DC

## 2016-03-21 MED ORDER — SODIUM CHLORIDE 0.9 % IV SOLN
1600.0000 mg | Freq: Once | INTRAVENOUS | Status: AC
  Administered 2016-03-21: 1600 mg via INTRAVENOUS
  Filled 2016-03-21: qty 26.3

## 2016-03-21 MED ORDER — PALONOSETRON HCL INJECTION 0.25 MG/5ML
0.2500 mg | Freq: Once | INTRAVENOUS | Status: AC
  Administered 2016-03-21: 0.25 mg via INTRAVENOUS
  Filled 2016-03-21: qty 5

## 2016-03-21 MED ORDER — HEPARIN SOD (PORK) LOCK FLUSH 100 UNIT/ML IV SOLN
500.0000 [IU] | Freq: Once | INTRAVENOUS | Status: AC | PRN
  Administered 2016-03-21: 500 [IU]
  Filled 2016-03-21: qty 5

## 2016-03-21 MED ORDER — SODIUM CHLORIDE 0.9 % IV SOLN: 10.0000 mg | Freq: Once | INTRAVENOUS | Status: DC

## 2016-03-21 NOTE — Progress Notes (Signed)
Neutrophils 1.3 today. Dr Mike Gip said to proceed with chemotherapy today. New Tx plan. Chemo consent signed.

## 2016-03-22 ENCOUNTER — Inpatient Hospital Stay: Payer: Medicare Other

## 2016-03-22 VITALS — BP 124/64 | HR 82 | Temp 98.8°F | Resp 18

## 2016-03-22 DIAGNOSIS — C801 Malignant (primary) neoplasm, unspecified: Secondary | ICD-10-CM

## 2016-03-22 DIAGNOSIS — C561 Malignant neoplasm of right ovary: Secondary | ICD-10-CM | POA: Diagnosis not present

## 2016-03-22 DIAGNOSIS — C786 Secondary malignant neoplasm of retroperitoneum and peritoneum: Secondary | ICD-10-CM

## 2016-03-22 DIAGNOSIS — C569 Malignant neoplasm of unspecified ovary: Secondary | ICD-10-CM

## 2016-03-22 LAB — CA 125: CA 125: 220.8 U/mL — ABNORMAL HIGH (ref 0.0–38.1)

## 2016-03-22 MED ORDER — PEGFILGRASTIM INJECTION 6 MG/0.6ML ~~LOC~~
6.0000 mg | PREFILLED_SYRINGE | Freq: Once | SUBCUTANEOUS | Status: AC
  Administered 2016-03-22: 6 mg via SUBCUTANEOUS
  Filled 2016-03-22: qty 0.6

## 2016-03-23 ENCOUNTER — Telehealth: Payer: Self-pay | Admitting: *Deleted

## 2016-03-23 ENCOUNTER — Other Ambulatory Visit: Payer: Self-pay | Admitting: Hematology and Oncology

## 2016-03-23 NOTE — Telephone Encounter (Signed)
That rx was to be sent in yesterday for nausea, but it did not get sent. When checking her meds, I noted that she has a refill on her current rx and she will call pharmacy to get it filled

## 2016-03-23 NOTE — Telephone Encounter (Signed)
Thanks for sending.

## 2016-03-23 NOTE — Telephone Encounter (Signed)
Called to report that the nausea med that Dr Mike Gip was to call in yesterday did not get sent.

## 2016-03-29 ENCOUNTER — Other Ambulatory Visit: Payer: Self-pay | Admitting: Hematology and Oncology

## 2016-03-31 ENCOUNTER — Inpatient Hospital Stay: Payer: Medicare Other

## 2016-03-31 ENCOUNTER — Inpatient Hospital Stay (HOSPITAL_BASED_OUTPATIENT_CLINIC_OR_DEPARTMENT_OTHER): Payer: Medicare Other | Admitting: Hematology and Oncology

## 2016-03-31 ENCOUNTER — Encounter: Payer: Self-pay | Admitting: Hematology and Oncology

## 2016-03-31 VITALS — BP 132/62 | HR 76 | Temp 98.6°F | Resp 16 | Wt 139.8 lb

## 2016-03-31 DIAGNOSIS — C801 Malignant (primary) neoplasm, unspecified: Secondary | ICD-10-CM

## 2016-03-31 DIAGNOSIS — R0602 Shortness of breath: Secondary | ICD-10-CM

## 2016-03-31 DIAGNOSIS — R109 Unspecified abdominal pain: Secondary | ICD-10-CM

## 2016-03-31 DIAGNOSIS — I2510 Atherosclerotic heart disease of native coronary artery without angina pectoris: Secondary | ICD-10-CM

## 2016-03-31 DIAGNOSIS — I10 Essential (primary) hypertension: Secondary | ICD-10-CM

## 2016-03-31 DIAGNOSIS — Z794 Long term (current) use of insulin: Secondary | ICD-10-CM

## 2016-03-31 DIAGNOSIS — Z7689 Persons encountering health services in other specified circumstances: Secondary | ICD-10-CM | POA: Diagnosis not present

## 2016-03-31 DIAGNOSIS — Z7901 Long term (current) use of anticoagulants: Secondary | ICD-10-CM

## 2016-03-31 DIAGNOSIS — E108 Type 1 diabetes mellitus with unspecified complications: Secondary | ICD-10-CM

## 2016-03-31 DIAGNOSIS — C561 Malignant neoplasm of right ovary: Secondary | ICD-10-CM | POA: Diagnosis not present

## 2016-03-31 DIAGNOSIS — D72819 Decreased white blood cell count, unspecified: Secondary | ICD-10-CM

## 2016-03-31 DIAGNOSIS — Z9221 Personal history of antineoplastic chemotherapy: Secondary | ICD-10-CM

## 2016-03-31 DIAGNOSIS — M359 Systemic involvement of connective tissue, unspecified: Secondary | ICD-10-CM

## 2016-03-31 DIAGNOSIS — C569 Malignant neoplasm of unspecified ovary: Secondary | ICD-10-CM

## 2016-03-31 DIAGNOSIS — E785 Hyperlipidemia, unspecified: Secondary | ICD-10-CM

## 2016-03-31 DIAGNOSIS — N1330 Unspecified hydronephrosis: Secondary | ICD-10-CM

## 2016-03-31 DIAGNOSIS — I82C19 Acute embolism and thrombosis of unspecified internal jugular vein: Secondary | ICD-10-CM

## 2016-03-31 DIAGNOSIS — I5032 Chronic diastolic (congestive) heart failure: Secondary | ICD-10-CM

## 2016-03-31 DIAGNOSIS — Z9641 Presence of insulin pump (external) (internal): Secondary | ICD-10-CM

## 2016-03-31 DIAGNOSIS — Z9049 Acquired absence of other specified parts of digestive tract: Secondary | ICD-10-CM

## 2016-03-31 DIAGNOSIS — C786 Secondary malignant neoplasm of retroperitoneum and peritoneum: Secondary | ICD-10-CM | POA: Diagnosis not present

## 2016-03-31 DIAGNOSIS — R232 Flushing: Secondary | ICD-10-CM

## 2016-03-31 DIAGNOSIS — Z808 Family history of malignant neoplasm of other organs or systems: Secondary | ICD-10-CM

## 2016-03-31 DIAGNOSIS — I252 Old myocardial infarction: Secondary | ICD-10-CM

## 2016-03-31 DIAGNOSIS — I82C11 Acute embolism and thrombosis of right internal jugular vein: Secondary | ICD-10-CM

## 2016-03-31 DIAGNOSIS — K8020 Calculus of gallbladder without cholecystitis without obstruction: Secondary | ICD-10-CM

## 2016-03-31 DIAGNOSIS — C787 Secondary malignant neoplasm of liver and intrahepatic bile duct: Secondary | ICD-10-CM

## 2016-03-31 DIAGNOSIS — Z87891 Personal history of nicotine dependence: Secondary | ICD-10-CM

## 2016-03-31 DIAGNOSIS — Z5111 Encounter for antineoplastic chemotherapy: Secondary | ICD-10-CM | POA: Insufficient documentation

## 2016-03-31 DIAGNOSIS — Z8701 Personal history of pneumonia (recurrent): Secondary | ICD-10-CM

## 2016-03-31 DIAGNOSIS — K219 Gastro-esophageal reflux disease without esophagitis: Secondary | ICD-10-CM

## 2016-03-31 DIAGNOSIS — Z79810 Long term (current) use of selective estrogen receptor modulators (SERMs): Secondary | ICD-10-CM

## 2016-03-31 DIAGNOSIS — K222 Esophageal obstruction: Secondary | ICD-10-CM

## 2016-03-31 DIAGNOSIS — K449 Diaphragmatic hernia without obstruction or gangrene: Secondary | ICD-10-CM

## 2016-03-31 DIAGNOSIS — E876 Hypokalemia: Secondary | ICD-10-CM

## 2016-03-31 DIAGNOSIS — R188 Other ascites: Secondary | ICD-10-CM

## 2016-03-31 DIAGNOSIS — Z90722 Acquired absence of ovaries, bilateral: Secondary | ICD-10-CM

## 2016-03-31 DIAGNOSIS — I509 Heart failure, unspecified: Secondary | ICD-10-CM

## 2016-03-31 DIAGNOSIS — Z90710 Acquired absence of both cervix and uterus: Secondary | ICD-10-CM

## 2016-03-31 DIAGNOSIS — M069 Rheumatoid arthritis, unspecified: Secondary | ICD-10-CM

## 2016-03-31 DIAGNOSIS — D61818 Other pancytopenia: Secondary | ICD-10-CM

## 2016-03-31 DIAGNOSIS — N644 Mastodynia: Secondary | ICD-10-CM

## 2016-03-31 LAB — CBC WITH DIFFERENTIAL/PLATELET
Basophils Absolute: 0.1 10*3/uL (ref 0–0.1)
Basophils Relative: 1 %
Eosinophils Absolute: 0 10*3/uL (ref 0–0.7)
Eosinophils Relative: 1 %
HCT: 29.9 % — ABNORMAL LOW (ref 35.0–47.0)
Hemoglobin: 10.2 g/dL — ABNORMAL LOW (ref 12.0–16.0)
Lymphocytes Relative: 13 %
Lymphs Abs: 0.8 10*3/uL — ABNORMAL LOW (ref 1.0–3.6)
MCH: 29.5 pg (ref 26.0–34.0)
MCHC: 34.1 g/dL (ref 32.0–36.0)
MCV: 86.4 fL (ref 80.0–100.0)
Monocytes Absolute: 0.5 10*3/uL (ref 0.2–0.9)
Monocytes Relative: 9 %
Neutro Abs: 4.4 10*3/uL (ref 1.4–6.5)
Neutrophils Relative %: 76 %
Platelets: 107 10*3/uL — ABNORMAL LOW (ref 150–440)
RBC: 3.46 MIL/uL — ABNORMAL LOW (ref 3.80–5.20)
RDW: 15.5 % — ABNORMAL HIGH (ref 11.5–14.5)
WBC: 5.8 10*3/uL (ref 3.6–11.0)

## 2016-03-31 LAB — BASIC METABOLIC PANEL
Anion gap: 8 (ref 5–15)
BUN: 22 mg/dL — ABNORMAL HIGH (ref 6–20)
CO2: 26 mmol/L (ref 22–32)
Calcium: 9.5 mg/dL (ref 8.9–10.3)
Chloride: 102 mmol/L (ref 101–111)
Creatinine, Ser: 0.95 mg/dL (ref 0.44–1.00)
GFR calc Af Amer: >60 mL/min (ref >60)
GFR calc non Af Amer: 58 mL/min — ABNORMAL LOW (ref >60)
Glucose, Bld: 172 mg/dL — ABNORMAL HIGH (ref 65–99)
Potassium: 4.2 mmol/L (ref 3.5–5.1)
Sodium: 136 mmol/L (ref 135–145)

## 2016-03-31 LAB — MAGNESIUM: Magnesium: 1.4 mg/dL — ABNORMAL LOW (ref 1.7–2.4)

## 2016-03-31 MED ORDER — HEPARIN SOD (PORK) LOCK FLUSH 100 UNIT/ML IV SOLN
500.0000 [IU] | Freq: Once | INTRAVENOUS | Status: AC
  Administered 2016-03-31: 500 [IU] via INTRAVENOUS
  Filled 2016-03-31: qty 5

## 2016-03-31 MED ORDER — SODIUM CHLORIDE 0.9% FLUSH
10.0000 mL | INTRAVENOUS | Status: DC | PRN
  Administered 2016-03-31: 10 mL via INTRAVENOUS
  Filled 2016-03-31: qty 10

## 2016-03-31 MED ORDER — MAGNESIUM SULFATE 2 GM/50ML IV SOLN
2.0000 g | Freq: Once | INTRAVENOUS | Status: AC
  Administered 2016-03-31: 2 g via INTRAVENOUS
  Filled 2016-03-31: qty 50

## 2016-03-31 NOTE — Progress Notes (Signed)
Patient was fatigued after treatment last week but started feeling better on the 5th day.  Also had some body aches after the Neulasta injection.

## 2016-03-31 NOTE — Progress Notes (Signed)
[facility] day:  03/31/2016   Chief Complaint: [name] is a 70 y.o. female with stage IV ovarian cancer who is seen for nadir assessment on day 11 of cycle #1 carboplatin and gemcitabine.  HPI:  The patient was last seen in the medical oncology clinic on 03/21/2016.  At that time, she received day 1 of cycle #1 carboplatin and gemcitabine.  She received Neulasta support on 03/22/2016.    She states that she tolerated her chemotherapy well.  She had some nausea well managed.  She stats that the Claritin helped with Neulasta induced bone pain.  She had a few aches.  She did not require Tylenol or any pain medications.  She felt fatigued.  Appetite was decreased for 3-4 days.  She is interested in switching from Lovenox to Eliquis.   Past Medical History:  Diagnosis Date  . CAD (coronary artery disease)    a. 09/2012 Cath: LM nl, LAD 95p, 66m LCX 91mOM2 50, RCA 100.  . Marland Kitchenholelithiasis   . Chronic diastolic CHF (congestive heart failure) ([place]  . Collagen vascular disease ([place]  . Esophageal stricture   . Exertional shortness of breath   . GERD (gastroesophageal reflux disease)   . H/O hiatal hernia   . Herniated disc   . History of pancytopenia   . Hyperlipidemia   . Hypertension   . Hypokalemia   . Leukopenia 2012   s/p bone marrow biopsy, Dr. PaMa Hillock. NSTEMI (non-ST elevated myocardial infarction) ([place]/2014   "mild" (10/18/2012)  . Ovarian cancer ([facility]   chemo  . Pneumonia 2013; 08/2012   "one lung; double" (10/18/2012)  . Rheumatoid arthritis(714.0)   . Type I diabetes mellitus (HCP[facility]ince [place]   "dx'd in 1957" (10/18/2012)    Past Surgical History:  Procedure Laterality Date  . ABDOMINAL HYSTERECTOMY  06/15/2015   Dx L/S, EXLAP TAH BSO omentectomy RSRx colostomy diaphragm resection stripping  . CARDIAC CATHETERIZATION  10/18/2012   "first one was today" (10/18/2012)  . CATARACT EXTRACTION W/ INTRAOCULAR LENS IMPLANT Right  2010  . CHOLECYSTECTOMY  06/15/2015   combined case with ovarian cancer debulking  . COLON SURGERY    . CORONARY ARTERY BYPASS GRAFT N/A 10/19/2012   Procedure: CORONARY ARTERY BYPASS GRAFTING (CABG);  Surgeon: StMelrose NakayamaMD;  Location: [facility] Service: Open Heart Surgery;  Laterality: N/A;  . ESOPHAGEAL DILATION     "3 or 4 times" (10/19/2011)  . ESOPHAGOGASTRODUODENOSCOPY  2012   Dr. IfMinna Merritts. OSTOMY    . OVARY SURGERY     removal  . PERIPHERAL VASCULAR CATHETERIZATION N/A 03/02/2015   Procedure: PoGlori Luisath Insertion;  Surgeon: JaAlgernon HuxleyMD;  Location: [facility];  Service: Cardiovascular;  Laterality: N/A;  . TUBAL LIGATION  1970    Family History  Problem Relation Age of Onset  . Diabetes Mother   . Arthritis Mother   . Diabetes Father   . Arthritis Father   . Bone cancer Sister     Social History:  reports that she quit smoking about 21 years ago. Her smoking use included Cigarettes. She has a 30.00 pack-year smoking history. She has never used smokeless tobacco. She reports that she does not drink alcohol or use drugs.  She is accompanied by her husband today.  Allergies:  Allergies  Allergen Reactions  . Codeine Nausea And Vomiting  . Latex Rash    Current  Medications: Current Outpatient Prescriptions  Medication Sig Dispense Refill  . amLODipine (NORVASC) 5 MG tablet Take by mouth.    . Calcium-Vitamin D 600-200 MG-UNIT tablet Take 2 tablets by mouth daily.     Marland Kitchen enoxaparin (LOVENOX) 60 MG/0.6ML injection Inject 0.6 mLs (60 mg total) into the skin every 12 (twelve) hours. Held last pm and this am dose for bone marrow biopsy 20 Syringe 0  . folic acid (FOLVITE) 1 MG tablet TAKE 1 TABLET BY MOUTH ONCE A DAY 90 tablet 3  . furosemide (LASIX) 80 MG tablet Take 80 mg by mouth daily as needed.     . Insulin Human (INSULIN PUMP) SOLN Pt uses Humalog insulin.    Marland Kitchen insulin lispro (HUMALOG) 100 UNIT/ML injection Inject 0.06 mLs (6 Units total) into the  skin daily. 30 mL 3  . lidocaine-prilocaine (EMLA) cream Apply 1 application topically as needed (prior to accessing port).    . loratadine (CLARITIN) 10 MG tablet Take 1 tablet by mouth daily as needed.    Marland Kitchen losartan (COZAAR) 100 MG tablet Take 1 tablet (100 mg total) by mouth daily. 90 tablet 1  . methotrexate (RHEUMATREX) 2.5 MG tablet Take by mouth.    . metoprolol tartrate (LOPRESSOR) 25 MG tablet TAKE 1 TABLET BY MOUTH TWICE A DAY AS NEEDED 180 tablet 3  . ondansetron (ZOFRAN) 8 MG tablet TAKE 1 TABLET BY MOUTH EVERY 8 HOURS AS NEEDED FOR NAUSEA OR VOMITING 30 tablet 1  . ONE TOUCH ULTRA TEST test strip     . pantoprazole (PROTONIX) 40 MG tablet TAKE 1 TABLET BY MOUTH TWICE A DAY 180 tablet 1  . potassium chloride SA (K-DUR,KLOR-CON) 20 MEQ tablet TAKE 2 TABLETS BY MOUTH ONCE A DAY 60 tablet 1  . predniSONE (DELTASONE) 5 MG tablet Take 5 mg by mouth as needed. Reported on 09/17/2015    . ranitidine (ZANTAC) 150 MG tablet Take 1 tablet (150 mg total) by mouth 2 (two) times daily. (Patient taking differently: Take 150 mg by mouth 2 (two) times daily as needed. ) 180 tablet 2  . vitamin C (ASCORBIC ACID) 500 MG tablet Take 500 mg by mouth daily.    Marland Kitchen zinc gluconate 50 MG tablet Take 50 mg by mouth daily.    Marland Kitchen oxyCODONE (OXY IR/ROXICODONE) 5 MG immediate release tablet Take 5 mg by mouth every 6 (six) hours as needed. Reported on 11/17/2015    . tamoxifen (NOLVADEX) 20 MG tablet Take 1 tablet (20 mg total) by mouth daily. (Patient not taking: Reported on 03/31/2016) 30 tablet 1   No current facility-administered medications for this visit.    Facility-Administered Medications Ordered in Other Visits  Medication Dose Route Frequency Provider Last Rate Last Dose  . sodium chloride 0.9 % injection 10 mL  10 mL Intracatheter PRN Lequita Asal, MD      . sodium chloride 0.9 % injection 10 mL  10 mL Intravenous PRN Lequita Asal, MD   10 mL at 04/13/15 5374    Review of Systems:   GENERAL:  Feels wonderful today.  No fever, chills or sweats.  Weight down 2 pounds. PERFORMANCE STATUS (ECOG):  1 HEENT:  No visual changes, sore throat, mouth sores or tenderness. Lungs:  No shortness of breath or cough.  No hemoptysis. Cardiac:  No chest pain, palpitations, orthopnea, or PND. GI:  Less upper abdominal fullness.  No constipation, diarrhea, melena or hematochezia. GU:  No urgency, frequency, dysuria, or hematuria. Musculoskeletal:  Severe  rheumatoid arthritis (MTX discontinued).  Osteoporosis.  No back pain. No muscle tenderness. Extremities:  No pain or swelling. Skin:  Bruising with Lovenox injections.  No rashes or skin changes. Neuro:  Neuropathy (stable).  No headache, numbness or weakness, balance or coordination issues. Endocrine:  Diabetes on an insulin pump.  No thyroid issues, hot flashes or night sweats. Psych:  No mood changes, depression or anxiety. Pain: No pain. Review of systems:  All other systems reviewed and found to be negative.  Physical Exam: Blood pressure 132/62, pulse 76, temperature 98.6 F (37 C), temperature source Oral, resp. rate 16, weight 139 lb 12.4 oz (63.4 kg). GENERAL:  Well developed, well nourished woman sitting comfortably in the exam room in no acute distress. MENTAL STATUS:  Alert and oriented to person, place and time. HEAD:  Curly gray hair.  Normocephalic, atraumatic, face symmetric, no Cushingoid features. EYES:  Gold rimmed glasses.  Blue eyes.  No conjunctivitis or scleral icterus.  ENT: Oropharynx clear without lesion. Tongue normal. Mucous membranes moist.  RESPIRATORY: Clear to auscultation without rales, wheezes or rhonchi. CARDIOVASCULAR: Regular rate and rhythm without murmur, rub or gallop. ABDOMEN: Soft, non-tender with active bowel sounds and no appreciable hepatosplenomegaly. No palpable nodularity or masses. SKIN: No rashes, ulcers, or bruises. EXTREMITIES:  No edema, skin discoloration or tenderness.  No palpable cords. LYMPH NODES: No palpable cervical, supraclavicular, axillary or inguinal adenopathy  NEUROLOGICAL: Appropriate. PSYCH: Appropriate.   Appointment on 03/31/2016  Component Date Value Ref Range Status  . WBC 03/31/2016 5.8  3.6 - 11.0 K/uL Final  . RBC 03/31/2016 3.46* 3.80 - 5.20 MIL/uL Final  . Hemoglobin 03/31/2016 10.2* 12.0 - 16.0 g/dL Final  . HCT 03/31/2016 29.9* 35.0 - 47.0 % Final  . MCV 03/31/2016 86.4  80.0 - 100.0 fL Final  . MCH 03/31/2016 29.5  26.0 - 34.0 pg Final  . MCHC 03/31/2016 34.1  32.0 - 36.0 g/dL Final  . RDW 03/31/2016 15.5* 11.5 - 14.5 % Final  . Platelets 03/31/2016 107* 150 - 440 K/uL Final  . Neutrophils Relative % 03/31/2016 76  % Final  . Neutro Abs 03/31/2016 4.4  1.4 - 6.5 K/uL Final  . Lymphocytes Relative 03/31/2016 13  % Final  . Lymphs Abs 03/31/2016 0.8* 1.0 - 3.6 K/uL Final  . Monocytes Relative 03/31/2016 9  % Final  . Monocytes Absolute 03/31/2016 0.5  0.2 - 0.9 K/uL Final  . Eosinophils Relative 03/31/2016 1  % Final  . Eosinophils Absolute 03/31/2016 0.0  0 - 0.7 K/uL Final  . Basophils Relative 03/31/2016 1  % Final  . Basophils Absolute 03/31/2016 0.1  0 - 0.1 K/uL Final  . Sodium 03/31/2016 136  135 - 145 mmol/L Final  . Potassium 03/31/2016 4.2  3.5 - 5.1 mmol/L Final  . Chloride 03/31/2016 102  101 - 111 mmol/L Final  . CO2 03/31/2016 26  22 - 32 mmol/L Final  . Glucose, Bld 03/31/2016 172* 65 - 99 mg/dL Final  . BUN 03/31/2016 22* 6 - 20 mg/dL Final  . Creatinine, Ser 03/31/2016 0.95  0.44 - 1.00 mg/dL Final  . Calcium 03/31/2016 9.5  8.9 - 10.3 mg/dL Final  . GFR calc non Af Amer 03/31/2016 58* >60 mL/min Final  . GFR calc Af Amer 03/31/2016 >60  >60 mL/min Final   Comment: (NOTE) The eGFR has been calculated using the CKD EPI equation. This calculation has not been validated in all clinical situations. eGFR's persistently <60 mL/min signify possible Chronic  Kidney Disease.   . Anion gap 03/31/2016 8  5 -  15 Final  . Magnesium 03/31/2016 1.4* 1.7 - 2.4 mg/dL Final    Assessment:  IYLAH DWORKIN is a 70 y.o. female with stage IV ovarian cancer.  She presented with abdominal discomfort and bloating.  Omental biopsy on 02/23/2015 revealed metastatic high grade serous carcinoma, consistent with gynecologic origin.   She was initially diagnosed with clinical stage IIIC (T3cN1Mx).  CA125 was 707 on 02/17/2015.  Abdomen and pelvic CT scan on 02/13/2015 revealed bilateral mass-like adnexal regions (right adnexal mass 5.6 x 5.0 cm and the left adnexa mass 10.3 x 6.0 x 9.9 cm).  There was a large amount of soft tissue throughout the peritoneal cavity involving the omentum and other peritoneal surfaces.  There was a small volume ascites. There was a peripheral 3.3 x 1.9 cm low-attenuation lesion overlying the right lobe of the liver , likely representing a serosal implant. There was 1.4 x 2.2 cm ill-defined peripheral lesion within the inferior aspect of segment 6 adjacent to the ampulla in the duodenum.   She received 4 cycles of neoadjuvant carboplatin and Taxol (03/05/2015 - 05/22/2015).  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  She had loose stools on oral magnesium.  She was initially on Neurontin then switched Lyrica with cycle #3.  Cycle #4 was notable for neutropenia (ANC 300) requiring GCSF x 3 days.    CA125 was 802.9 on 03/30/2015, 567.9 on 04/13/2015, 168.8 on 05/15/2015, 85.2 on 07/17/2015, 68.6 on 07/28/2015, 34.5 on 09/17/2015, 20.7 on 11/06/2015, 49 on 01/22/2016, 106.1 on 02/22/2016, 138.8 on 03/07/2016, and 220.8 on 03/21/2016.  Abdominal and pelvic CT scan on 04/28/2015 revealed decreasing bilateral ovarian masses.   The left adnexal mass measured 4.0 x 6.1 cm (previously 5.0 x 8.5 cm). The right ovary measured 3.8 x 4.9 cm (previously 4.7 x 5.6 cm).  There was improved peritoneal carcinomatosis.  There was a small amount of ascites.  The hepatic dome lesion was stable. The previously  seen right hepatic lobe lesion was not well-visualized.  There was a small left pleural effusion and trace right pleural effusion.  The nodular lesion at the ampulla of Vater, extending into the duodenum was stable.  There was no biliary ductal dilatation.  She underwent exploratory laparotomy, lysis of adhesions, total abdominal hysterectomy with bilateral salpingo-oophorectomy, infracolic omentectomy, optimal tumor debulking(< 1 cm), recto-sigmoid resection with creation of end colostomy, cholecystectomy, mobilization of splenic flexure and liver with diaphragmatic stripping on 06/15/2015. The right diaphragm was cleared of tumor. During dissection, the diaphragm was entered and closed with sutures.   She has had a recurrent right sided pleural effusion.  She underwent thoracentesis of 650 cc on post-operative day 3.  She was admitted to [facility] on 07/01/2015 and 07/07/2015 for recurrent shortness of breath.  She has undergone 2 additional thoracentesis (1.1 L on 07/02/2015 and 850 cc on 07/08/2015).  Cytology was negative x 2.  Bilateral lower extremity duplex on 07/03/2015 was negative.  Echo on 07/08/2015 revealed en EF of 55-60%.    She has severe rheumatoid arthritis.  Methotrexate and Enbrel were initially on hold.  She has a normocytic anemia.  Work-up on 02/17/2015 revealed a normal ferritin, B12, folate, TSH.  She denies any melena or hematochezia.    She has anemia likely due to chronic disease. She received 1 unit PRBCs during her admission at [facility]. She denies any melena or hematochezia. She has diabetes and is on an  insulin pump.  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 She is on Lovenox 60 mg twice a day.   She was admitted to [facility] from 07/29/2015 - 08/10/2015 with a right-sided empyema and liver abscess. She underwent CT-guided  placement of a liver abscess drain on 07/30/2015. Liver abscess culture grew out group B strep and Enterobacter which was sensitive to Zosyn. She was transitioned to ertapenem Colbert Ewing) prior to discharge.  She was readmitted to [facility] on 09/17/2015.  Chest, abdomen, and pelvic CT scan on 09/01/2015 revealed continued decrease in perihepatic fluid collection contiguous with the right pleural space with percutaneous drain. Drain was removed on 09/01/2015.  Chest, abdomen, and pelvic CT scan on 09/11/2015 revealed a residual versus recurrent fluid collection posterior to the right hepatic lobe (2.6 x 1.1 x 4.0 cm).  Chest, abdomen, and pelvic CT scan on 10/13/2015 revealed resolution of the empyema.    She has had persistent neutropenia felt secondary to her rheumatoid arthritis.  Folate and MMA were normal.  TSH was 6.13 (high) with a free T4 of 1.17 (0.61-1.12).  She began methotrexate (10 mg a week) and prednisone (5 mg a day) for severe rheumatoid arthritis on 12/17/2015.  Counts are improving.  Bone marrow aspirate and biopsy on 06/09/2010 revealed a hypercellular marrow (70%) with no evidence of dysplasia or malignancy.  Flow cytometry was negative.  Cytogenetics were normal (46,XX).  FISH studies were negative for MDS.  Bone marrow aspirate and biopsy on 12/03/2015 revealed a normocellular to mildy hypercellular marrow for age (40%) with left shifted myelopoiesis, non specific dyserythropoiesis and mild megakaryocytic atypia with no increase in blasts.  There were multiple small nonspecific lymphoid aggregates (favor reactive). There was no increase in reticulin.  There was decreased myeloid cells (37%) with left shifted maturation and 1% atypical myelod blasts.  There was relatively increased monocytic cells (11%), relatively increased lymphoid cells (36%), and relatively increased eosinophils (6%).  Cytogenetics were normal (47, XX).  SNP microarray was normal.  Alkaline phosphatase was 348 (38-126)  on 11/06/2015 and 431 on 11/17/2015.   Fractionated alkaline phosphatase on 11/09/2015 revealed 21% bone and 79% liver.  MRI of the abdomen on 12/14/2015 revealed mild extrahepatic biliary duct dilatation without obstructing lesion identified.  Chest, abdomen, and pelvic CT scan on 02/15/2016 revealed multiple soft tissue nodules throughout the pelvis, small bowel mesenteric and peritoneum and, concerning for peritoneal metastatic disease.  There was soft tissue irregularity within the left upper quadrant.  There was mild right hydronephrosis (etiology unclear).  There was interval resolution of previously described right pleural based fluid and gas collection. There was a pleural based nodule within the right lower hemi-thorax concerning for pleural based metastasis.  There were small bilateral pleural effusions.  There was pulmonary nodularity, predominately within the left upper lobe (metastatic or infectious/inflammatory etiology).  There was slightly increased mediastinal adenopathy (infectious/inflammatory or metastatic).  There was a low attenuation lesion within the left hepatic lobe (complicated fluid within the fissure or metastatic disease).  She was on tamoxifen from 02/25/2016 - 03/14/2016.  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  Abdomen and pelvic CT scan on 03/08/2016 revealed mild progression of peritoneal disease in the abdomen.  There was interval increase in loculated fluid around the lateral segment left liver, stomach, and spleen.  There was persistent soft tissue lesion at the level  of the ampulla with mild intra and extrahepatic biliary duct dilatation.  There was persistent intrahepatic and capsular metastatic disease involving the liver.  She is currently day 11 of cycle #1 carboplatin and gemcitabine (03/21/2016) with Neulasta support.  ANC is good.  She has mild thrombocytopenia (107,000).  Symptomatically, she feels good today.  Upper abdominal fullness  has improved.  She has hypomagnesemia (1.4).  Plan: 1.  Labs today:  CBC with diff, BMP, Mg. 2.  Magnesium 2 gm IV today. 3.  Review counts.  If platelet count normal on 04/04/2016, will switch Lovenox to Eliquis per patient request. 4.  RTC on 04/04/2016 for labs (CBC, Mg) 5.  RTC in 04/11/2016 for MD assessment, labs (CBC with diff, CMP, Mg, CA125), and day 1 of cycle #2 carboplatin and gemcitabine     Lequita Asal, MD  03/31/2016, 9:27 AM

## 2016-04-01 ENCOUNTER — Encounter: Payer: Medicare Other | Attending: Nurse Practitioner | Admitting: Nurse Practitioner

## 2016-04-01 DIAGNOSIS — I2510 Atherosclerotic heart disease of native coronary artery without angina pectoris: Secondary | ICD-10-CM | POA: Insufficient documentation

## 2016-04-01 DIAGNOSIS — Z79899 Other long term (current) drug therapy: Secondary | ICD-10-CM | POA: Insufficient documentation

## 2016-04-01 DIAGNOSIS — Z7952 Long term (current) use of systemic steroids: Secondary | ICD-10-CM | POA: Insufficient documentation

## 2016-04-01 DIAGNOSIS — I5032 Chronic diastolic (congestive) heart failure: Secondary | ICD-10-CM | POA: Insufficient documentation

## 2016-04-01 DIAGNOSIS — L89623 Pressure ulcer of left heel, stage 3: Secondary | ICD-10-CM | POA: Diagnosis not present

## 2016-04-01 DIAGNOSIS — Z87891 Personal history of nicotine dependence: Secondary | ICD-10-CM | POA: Diagnosis not present

## 2016-04-01 DIAGNOSIS — E10621 Type 1 diabetes mellitus with foot ulcer: Secondary | ICD-10-CM | POA: Diagnosis present

## 2016-04-01 DIAGNOSIS — I110 Hypertensive heart disease with heart failure: Secondary | ICD-10-CM | POA: Insufficient documentation

## 2016-04-01 DIAGNOSIS — C786 Secondary malignant neoplasm of retroperitoneum and peritoneum: Secondary | ICD-10-CM | POA: Diagnosis not present

## 2016-04-01 DIAGNOSIS — M069 Rheumatoid arthritis, unspecified: Secondary | ICD-10-CM | POA: Insufficient documentation

## 2016-04-01 DIAGNOSIS — Z9221 Personal history of antineoplastic chemotherapy: Secondary | ICD-10-CM | POA: Insufficient documentation

## 2016-04-01 DIAGNOSIS — C787 Secondary malignant neoplasm of liver and intrahepatic bile duct: Secondary | ICD-10-CM | POA: Diagnosis not present

## 2016-04-01 DIAGNOSIS — E1040 Type 1 diabetes mellitus with diabetic neuropathy, unspecified: Secondary | ICD-10-CM | POA: Insufficient documentation

## 2016-04-01 DIAGNOSIS — C569 Malignant neoplasm of unspecified ovary: Secondary | ICD-10-CM | POA: Diagnosis not present

## 2016-04-01 DIAGNOSIS — Z794 Long term (current) use of insulin: Secondary | ICD-10-CM | POA: Diagnosis not present

## 2016-04-02 NOTE — Progress Notes (Signed)
[name] ([id number]) Visit Report for 04/01/2016 Arrival Information Details Patient Name: [name], [name] Date of Service: 04/01/2016 10:45 AM Medical Record Number: [id number] Patient Account Number: [id number] Date of Birth/Sex: [birth year] (70 y.o. Female) Treating [name]: [name] Primary Care Physician: [name] Other Clinician: Referring Physician: Caryl Bis, [name] Treating Physician/Extender: [name] in Treatment: 13 Visit Information History Since Last Visit All ordered tests and consults were completed: No Patient Arrived: Cane Added or deleted any medications: No Arrival Time: 10:50 Any new allergies or adverse reactions: No Accompanied By: husband Had a fall or experienced change in No Transfer Assistance: None activities of daily living that may affect Patient Identification Verified: Yes risk of falls: Secondary Verification Process Yes Signs or symptoms of abuse/neglect since last No Completed: visito Patient Requires Transmission- No Hospitalized since last visit: No Based Precautions: Has Dressing in Place as Prescribed: Yes Patient Has Alerts: Yes Pain Present Now: No Patient Alerts: Patient on Blood Thinner lovenox BID DMII Electronic Signature(s) Signed: 04/01/2016 12:03:08 PM By: [name] Entered By: Janet Lemming on 04/01/2016 10:50:47 [name] ([id number]) -------------------------------------------------------------------------------- Encounter Discharge Information Details Patient Name: [name] Date of Service: 04/01/2016 10:45 AM Medical Record Number: [id number] Patient Account Number: [id number] Date of Birth/Sex: Sep 09, 1945 (70 y.o. Female) Treating [name]: [name] Primary Care Physician: [name] Other Clinician: Referring Physician: Caryl Bis, [name] Treating Physician/Extender: [name] in Treatment: 13 Encounter Discharge Information  Items Discharge Pain Level: 0 Discharge Condition: Stable Ambulatory Status: Cane Discharge Destination: Home Private Transportation: Auto Accompanied By: husband Schedule Follow-up Appointment: No Medication Reconciliation completed and No provided to Patient/Care [name]: Clinical Summary of Care: Electronic Signature(s) Signed: 04/01/2016 12:03:08 PM By: [name] Entered By: Janet Lemming on 04/01/2016 11:46:40 [name] ([id number]) -------------------------------------------------------------------------------- Lower Extremity Assessment Details Patient Name: [name] Date of Service: 04/01/2016 10:45 AM Medical Record Number: [id number] Patient Account Number: [id number] Date of Birth/Sex: [birth year] (70 y.o. Female) Treating [name]: [name] Primary Care Physician: [name] Other Clinician: Referring Physician: Caryl Bis, [name] Treating Physician/Extender: [name] in Treatment: 13 Edema Assessment Assessed: [Left: No] [Right: No] Edema: [Left: N] [Right: o] Vascular Assessment Claudication: Claudication Assessment [Left:None] Pulses: Posterior Tibial Dorsalis Pedis Palpable: [Left:Yes] Extremity colors, hair growth, and conditions: Extremity Color: [Left:Normal] Hair Growth on Extremity: [Left:Yes] Temperature of Extremity: [Left:Warm] Capillary Refill: [Left:< 3 seconds] Toe Nail Assessment Left: Right: Thick: No Discolored: No Deformed: No Improper Length and Hygiene: No Electronic Signature(s) Signed: 04/01/2016 12:03:08 PM By: [name] Entered By: Janet Lemming on 04/01/2016 10:56:28 [name] ([id number]) -------------------------------------------------------------------------------- Multi Wound Chart Details Patient Name: [name] Date of Service: 04/01/2016 10:45 AM Medical Record Number: [id number] Patient Account Number: [id number] Date of Birth/Sex: [birth year] (70 y.o.  Female) Treating [name]: [name] Primary Care Physician: [name] Other Clinician: Referring Physician: Caryl Bis, [name] Treating Physician/Extender: [name] in Treatment: 13 Vital Signs Height(in): 63 Pulse(bpm): 76 Weight(lbs): 130 Blood Pressure 115/57 (mmHg): Body Mass Index(BMI): 23 Temperature(F): 99.0 Respiratory Rate 16 (breaths/min): Photos: [2:No Photos] [N/A:N/A] Wound Location: [2:Left Calcaneus] [N/A:N/A] Wounding Event: [2:Pressure Injury] [N/A:N/A] Primary Etiology: [2:Pressure Ulcer] [N/A:N/A] Secondary Etiology: [2:Diabetic Wound/Ulcer of the Lower Extremity] [N/A:N/A] Comorbid History: [2:Cataracts, Type I Diabetes, Rheumatoid Arthritis, Osteoarthritis, Neuropathy, Received Chemotherapy] [N/A:N/A] Date Acquired: [2:07/15/2015] [N/A:N/A] Weeks of Treatment: [2:13] [N/A:N/A] Wound Status: [2:Open] [N/A:N/A] Measurements L x W x D 0.7x0.6x1 [N/A:N/A] (cm) Area (cm) : [  2:0.33] [N/A:N/A] Volume (cm) : [2:0.33] [N/A:N/A] % Reduction in Area: [2:-39.80%] [N/A:N/A] % Reduction in Volume: -251.10% [N/A:N/A] Classification: [2:Category/Stage III] [N/A:N/A] HBO Classification: [2:Grade 1] [N/A:N/A] Exudate Amount: [2:Medium] [N/A:N/A] Exudate Type: [2:Serous] [N/A:N/A] Exudate Color: [2:amber] [N/A:N/A] Wound Margin: [2:Flat and Intact] [N/A:N/A] Granulation Amount: [2:None Present (0%)] [N/A:N/A] Necrotic Amount: [2:Medium (34-66%)] [N/A:N/A] Exposed Structures: [2:Fascia: No Fat: No] [N/A:N/A] Tendon: No Muscle: No Joint: No Bone: No Limited to Skin Breakdown Epithelialization: None N/A N/A Periwound Skin Texture: Induration: Yes N/A N/A Edema: No Excoriation: No Callus: No Crepitus: No Fluctuance: No Friable: No Rash: No Scarring: No Periwound Skin Maceration: Yes N/A N/A Moisture: Moist: Yes Dry/Scaly: No Periwound Skin Color: Atrophie Blanche: No N/A N/A Cyanosis: No Ecchymosis: No Erythema:  No Hemosiderin Staining: No Mottled: No Pallor: No Rubor: No Temperature: No Abnormality N/A N/A Tenderness on Yes N/A N/A Palpation: Wound Preparation: Ulcer Cleansing: N/A N/A Rinsed/Irrigated with Saline Topical Anesthetic Applied: Other: lidocaine 4% Treatment Notes Electronic Signature(s) Signed: 04/01/2016 12:03:08 PM By: [name] Entered By: Janet Lemming on 04/01/2016 11:33:21 [name] ([id number]) -------------------------------------------------------------------------------- Multi-Disciplinary Care Plan Details Patient Name: [name] Date of Service: 04/01/2016 10:45 AM Medical Record Number: [id number] Patient Account Number: [id number] Date of Birth/Sex: [birth year] (70 y.o. Female) Treating [name]: [name] Primary Care Physician: [name] Other Clinician: Referring Physician: Caryl Bis, [name] Treating Physician/Extender: [name] in Treatment: 13 Active Inactive Orientation to the Wound Care Program Nursing Diagnoses: Knowledge deficit related to the wound healing center program Goals: Patient/caregiver will verbalize understanding of the [facility] Date Initiated: 12/31/2015 Goal Status: Active Interventions: Provide education on orientation to the wound center Notes: Pressure Nursing Diagnoses: Knowledge deficit related to causes and risk factors for pressure ulcer development Knowledge deficit related to management of pressures ulcers Potential for impaired tissue integrity related to pressure, friction, moisture, and shear Goals: Patient will remain free from development of additional pressure ulcers Date Initiated: 12/31/2015 Goal Status: Active Patient will remain free of pressure ulcers Date Initiated: 12/31/2015 Goal Status: Active Patient/caregiver will verbalize risk factors for pressure ulcer development Date Initiated: 12/31/2015 Goal Status: Active Patient/caregiver will  verbalize understanding of pressure ulcer management Date Initiated: 12/31/2015 Goal Status: Active Interventions: [name], [name] ([id number]) Assess: immobility, friction, shearing, incontinence upon admission and as needed Assess offloading mechanisms upon admission and as needed Assess potential for pressure ulcer upon admission and as needed Provide education on pressure ulcers Treatment Activities: Patient referred for pressure reduction/relief devices : 12/31/2015 Notes: Wound/Skin Impairment Nursing Diagnoses: Impaired tissue integrity Knowledge deficit related to smoking impact on wound healing Knowledge deficit related to ulceration/compromised skin integrity Goals: Patient/caregiver will verbalize understanding of skin care regimen Date Initiated: 12/31/2015 Goal Status: Active Ulcer/skin breakdown will have a volume reduction of 30% by week 4 Date Initiated: 12/31/2015 Goal Status: Active Ulcer/skin breakdown will have a volume reduction of 50% by week 8 Date Initiated: 12/31/2015 Goal Status: Active Ulcer/skin breakdown will have a volume reduction of 80% by week 12 Date Initiated: 12/31/2015 Goal Status: Active Ulcer/skin breakdown will heal within 14 weeks Date Initiated: 12/31/2015 Goal Status: Active Interventions: Assess patient/caregiver ability to obtain necessary supplies Assess patient/caregiver ability to perform ulcer/skin care regimen upon admission and as needed Assess ulceration(s) every visit Provide education on ulcer and skin care Treatment Activities: Skin care regimen initiated : 12/31/2015 Topical wound management initiated : 12/31/2015 Notes: [name], [name] ([id number]) Electronic Signature(s) Signed: 04/01/2016 12:03:08 PM By: Janet Lemming BSN,  [name] Entered By: Janet Lemming on 04/01/2016 11:01:18 [name] ([id number]) -------------------------------------------------------------------------------- Pain Assessment Details Patient Name:  [name] Date of Service: 04/01/2016 10:45 AM Medical Record Number: [id number] Patient Account Number: [id number] Date of Birth/Sex: 1946/01/14 (70 y.o. Female) Treating [name]: [name] Primary Care Physician: [name] Other Clinician: Referring Physician: Caryl Bis, [name] Treating Physician/Extender: [name] in Treatment: 13 Active Problems Location of Pain Severity and Description of Pain Patient Has Paino No Site Locations With Dressing Change: No Pain Management and Medication Current Pain Management: Electronic Signature(s) Signed: 04/01/2016 12:03:08 PM By: [name] Entered By: Janet Lemming on 04/01/2016 10:51:02 [name] ([id number]) -------------------------------------------------------------------------------- Patient/Caregiver Education Details Patient Name: [name] Date of Service: 04/01/2016 10:45 AM Medical Record Number: [id number] Patient Account Number: [id number] Date of Birth/Gender: Jun 15, 1945 (70 y.o. Female) Treating [name]: [name] Primary Care Physician: [name] Other Clinician: Referring Physician: Caryl Bis, [name] Treating Physician/Extender: [name] in Treatment: 13 Education Assessment Education Provided To: Patient Education Topics Provided Pressure: Methods: Explain/Verbal Responses: State content correctly Welcome To [facility]: Methods: Explain/Verbal Responses: State content correctly Wound/Skin Impairment: Methods: Explain/Verbal Responses: State content correctly Electronic Signature(s) Signed: 04/01/2016 12:03:08 PM By: [name] Entered By: Janet Lemming on 04/01/2016 11:46:56 [name] ([id number]) -------------------------------------------------------------------------------- Wound Assessment Details Patient Name: [name] Date of Service: 04/01/2016 10:45 AM Medical Record Number: [id number] Patient  Account Number: [id number] Date of Birth/Sex: [birth year] (70 y.o. Female) Treating [name]: Afful, [name], BSN, [place] Primary Care Physician: [name] Other Clinician: Referring Physician: Caryl Bis, [name] Treating Physician/Extender: [name] in Treatment: 13 Wound Status Wound Number: 2 Primary Pressure Ulcer Etiology: Wound Location: Left Calcaneus Secondary Diabetic Wound/Ulcer of the Lower Wounding Event: Pressure Injury Etiology: Extremity Date Acquired: 07/15/2015 Wound Open Weeks Of Treatment: 13 Status: Clustered Wound: No Comorbid Cataracts, Type I Diabetes, History: Rheumatoid Arthritis, Osteoarthritis, Neuropathy, Received Chemotherapy Wound Measurements Length: (cm) 0.7 Width: (cm) 0.6 Depth: (cm) 1 Area: (cm) 0.33 Volume: (cm) 0.33 % Reduction in Area: -39.8% % Reduction in Volume: -251.1% Epithelialization: None Tunneling: No Undermining: No Wound Description Classification: Category/Stage III Foul Odor Diabetic Severity (Wagner): Grade 1 Wound Margin: Flat and Intact Exudate Amount: Medium Exudate Type: Serous Exudate Color: amber After Cleansing: No Wound Bed Granulation Amount: None Present (0%) Exposed Structure Necrotic Amount: Medium (34-66%) Fascia Exposed: No Necrotic Quality: Adherent Slough Fat Layer Exposed: No Tendon Exposed: No Muscle Exposed: No Joint Exposed: No Bone Exposed: No Limited to Skin Breakdown Periwound Skin Texture Texture Color No Abnormalities Noted: No No Abnormalities Noted: No [name], [name]. ([id number]) Callus: No Atrophie Blanche: No Crepitus: No Cyanosis: No Excoriation: No Ecchymosis: No Fluctuance: No Erythema: No Friable: No Hemosiderin Staining: No Induration: Yes Mottled: No Localized Edema: No Pallor: No Rash: No Rubor: No Scarring: No Temperature / Pain Moisture Temperature: No Abnormality No Abnormalities Noted: No Tenderness on Palpation: Yes Dry / Scaly:  No Maceration: Yes Moist: Yes Wound Preparation Ulcer Cleansing: Rinsed/Irrigated with Saline Topical Anesthetic Applied: Other: lidocaine 4%, Treatment Notes Wound #2 (Left Calcaneus) 1. Cleansed with: Clean wound with Normal Saline 4. Dressing Applied: Prisma Ag 5. Secondary [name] Signature(s) Signed: 04/01/2016 12:03:08 PM By: [name] Entered By: Janet Lemming on 04/01/2016 10:58:00 [name] ([id number]) -------------------------------------------------------------------------------- Vitals Details Patient Name: [name] Date of Service: 04/01/2016 10:45 AM Medical Record Number: [id number] Patient Account Number: [id number] Date of  Birth/Sex: Oct 16, 1945 (70 y.o. Female) Treating [name]: Afful, [name], BSN, [place] Primary Care Physician: [name] Other Clinician: Referring Physician: Caryl Bis, [name] Treating Physician/Extender: [name] in Treatment: 13 Vital Signs Time Taken: 10:51 Temperature (F): 99.0 Height (in): 63 Pulse (bpm): 76 Weight (lbs): 130 Respiratory Rate (breaths/min): 16 Body Mass Index (BMI): 23 Blood Pressure (mmHg): 115/57 Reference Range: 80 - 120 mg / dl Electronic Signature(s) Signed: 04/01/2016 12:03:08 PM By: [name] Entered By: Janet Lemming on 04/01/2016 10:53:44

## 2016-04-03 NOTE — Progress Notes (Signed)
[name], [name] ([id number]) Visit Report for 04/01/2016 Chief Complaint Document Details Patient Name: [name], [name] 04/01/2016 10:45 Date of Service: AM Medical Record [id number] Number: Patient Account Number: [id number] 11-Sep-1945 (70 y.o. Treating RN: Baruch Gouty, RN, BSN, Velva Harman Date of Birth/Sex: Female) Other Clinician: Primary Care Physician: Caryl Bis, ERIC Treating [name] Referring Physician: Caryl Bis, ERIC Physician/Extender: Suella Grove in Treatment: 13 Information Obtained from: Patient Chief Complaint [name] presents for evaluation of her left posterior heel pressure ulcer Electronic Signature(s) Signed: 04/01/2016 11:57:04 AM By: Janet Kocher, NP, [name] Entered By: Janet Kocher, NP, [name] on 04/01/2016 11:57:03 [name] ([id number]) -------------------------------------------------------------------------------- Debridement Details Patient Name: [name] 04/01/2016 10:45 Date of Service: AM Medical Record [id number] Number: Patient Account Number: [id number] 1945/10/21 (70 y.o. Treating RN: Baruch Gouty, RN, BSN, Velva Harman Date of Birth/Sex: Female) Other Clinician: Primary Care Physician: Caryl Bis, ERIC Treating [name] Referring Physician: Caryl Bis, ERIC Physician/Extender: Weeks in Treatment: 13 Debridement Performed for Wound #2 Left Calcaneus Assessment: Performed By: Physician Janet Cousins, NP Debridement: Open Wound/Selective Debridement Selective Description: Pre-procedure Yes - 11:42 Verification/Time Out Taken: Start Time: 11:42 Pain Control: Lidocaine 4% Topical Solution Total Area Debrided (L x 0.7 (cm) x 0.6 (cm) = 0.42 (cm) W): Tissue and other Non-Viable, Fibrin/Slough material debrided: Instrument: Curette Bleeding: Minimum Hemostasis Achieved: Pressure End Time: 11:44 Procedural Pain: 0 Post Procedural Pain: 0 Response to Treatment: Procedure was tolerated well Post Debridement Measurements of Total Wound Length: (cm)  0.7 Stage: Category/Stage III Width: (cm) 0.6 Depth: (cm) 1 Volume: (cm) 0.33 Character of Wound/Ulcer Post Requires Further Debridement: Debridement Severity of Tissue Post Fat layer exposed Debridement: Post Procedure Diagnosis Same as Pre-procedure Electronic Signature(s) [name], [name] ([id number]) Signed: 04/01/2016 11:56:42 AM By: Janet Kocher, NP, [name] Signed: 04/01/2016 12:03:08 PM By: [name] BSN, RN Entered By: Janet Kocher, NP, [name] on 04/01/2016 11:56:42 [name] ([id number]) -------------------------------------------------------------------------------- HPI Details Patient Name: [name], [name] 04/01/2016 10:45 Date of Service: AM Medical Record [id number] Number: Patient Account Number: [id number] Dec 17, 1945 (70 y.o. Treating RN: Afful, RN, BSN, Velva Harman Date of Birth/Sex: Female) Other Clinician: Primary Care Physician: Caryl Bis, ERIC Treating [name] Referring Physician: Caryl Bis, ERIC Physician/Extender: Weeks in Treatment: 13 History of Present Illness Location: right heel Quality: Patient reports experiencing a dull pain to affected area(s). Severity: Patient states wound are getting worse. Duration: Patient has had the wound for <5 months prior to presenting for treatment Timing: Pain in wound is Intermittent (comes and goes Context: The wound appeared gradually over time Modifying Factors: Consults to this date include: local care was done as per the medication given by the PCP which may have been medihoney Associated Signs and Symptoms: Patient reports having difficulty standing for long periods. 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 In the past she was seen for a decubitus ulcer on her right gluteal area. She is known to have  diabetes mellitus type 1 and her most recent A1c was 7.2% Past medical history significant for leukopenia, cholelithiasis, hypertension, chronic diastolic CHF, coronary artery disease,type 1 diabetes mellitus, rheumatoid arthritis, collagen vascular disease, ovarian cancer, status post CABG in 2014 and status post Port-A-Cath insertion by Dr. Leotis Pain in October 2016. She has quit smoking about 20 years ago. She was advised to use Medihoney with calcium alginate pads to be applied over the wound. 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 01/07/2016 -- x-ray of the left heel done -- IMPRESSION: No radiographic evidence of osteomyelitis. If this remains a clinical concern, recommend MR imaging. 02/05/16: returns today for f/u. denies fever, chills, body aches or malaise. no interval changes regarding health status. 02/12/16: pt is doing well. wound has improved. no s/s of infection. no systemic s/s of infection. She reports will be on vacation next week, and requests appointment in 2 weeks. 03/18/2016 - is going to be starting her chemotherapy this coming Monday and will have cycles every 3 weeks 04-01-16 [name], accompanied by her husband, presents for evaluation of her left posterior heel stage III pressure ulcer. She states that she started chemotherapy last week. Her chemotherapy is on a cycle of LETRISHA, STANGLE. ([id number]) one week on 3 weeks off, with her next treatment scheduled for December 11. There has been an increase in drainage since her last appointment. She denies any pain. They have been using PolyMem silver rope. Electronic Signature(s) Signed: 04/01/2016 11:58:52 AM By:  Janet Kocher, NP, [name] By: Janet Kocher, NP, Prabhnoor Ellenberger on 04/01/2016 11:58:52 [name], [name] ([id number]) -------------------------------------------------------------------------------- Physical Exam Details Patient Name: [name], [name] 04/01/2016 10:45 Date of Service: AM Medical Record [id number] Number: Patient Account Number: [id number] 03-30-1946 (70 y.o. Treating RN: Baruch Gouty, RN, BSN, Velva Harman Date of Birth/Sex: Female) Other Clinician: Primary Care Physician: Caryl Bis, ERIC Treating Lavonda Thal Referring Physician: Caryl Bis, ERIC Physician/Extender: Weeks in Treatment: 13 Constitutional BP within normal limits. afebrile. well nourished; well developed; appears stated age;Marland Kitchen Respiratory non-labored respiratory effort. Cardiovascular audible doppler signal DP and PT and peroneal. Musculoskeletal ambulated without assistance; steady gait. Integumentary (Hair, Skin) left posterior heel ulcer has minimal visible granulation tissue in the wound bed, no periwound erythema, minimal periwound maceration. no induration, no fluctuance, denies pain. Psychiatric appears to make sound judgement and have accurate insight regarding healthcare. oriented to time, place, person and situation. calm, pleasant, conversive. Electronic Signature(s) Signed: 04/01/2016 12:01:59 PM By: Janet Kocher, NP, [name] By: Janet Kocher, NP, Emnet Monk on 04/01/2016 12:01:59 ZEMA, FORTI ([id number]) -------------------------------------------------------------------------------- Physician Orders Details Patient Name: [name], [name] 04/01/2016 10:45 Date of Service: AM Medical Record [id number] Number: Patient Account Number: [id number] 1946-02-03 (70 y.o. Treating RN: Baruch Gouty, RN, BSN, Velva Harman Date of Birth/Sex: Female) Other Clinician: Primary Care Physician: Caryl Bis, ERIC Treating Adley Mazurowski Referring Physician: Caryl Bis, ERIC Physician/Extender: Suella Grove in Treatment: 43 Verbal / Phone Orders:  Yes Clinician: Afful, RN, BSN, Rita Read Back and Verified: Yes Diagnosis Coding Wound Cleansing Wound #2 Left Calcaneus o Cleanse wound with mild soap and water o May Shower, gently pat wound dry prior to applying new dressing. Anesthetic Wound #2 Left Calcaneus o Topical Lidocaine 4% cream applied to wound bed prior to debridement Skin Barriers/Peri-Wound Care Wound #2 Left Calcaneus o Skin Prep Primary Wound Dressing Wound #2 Left Calcaneus o Prisma Ag Secondary Dressing Wound #2 Left Calcaneus o Boardered Foam Dressing - or Coverllette Bandaid Dressing Change Frequency Wound #2 Left Calcaneus o Change dressing every other day. Follow-up Appointments Wound #2 Left Calcaneus o Return Appointment in 1 week. Off-Loading Wound #2 Left Calcaneus o Heel suspension boot to: - ELevate on pillows KEMA, REDING. ([id number]) Additional Orders / Instructions  Wound #2 Left Calcaneus o Increase protein intake. o Activity as tolerated o Other: - INclude Vitamin A, C, ZInc, MVI in diet Electronic Signature(s) Signed: 04/01/2016 12:03:08 PM By: [name] BSN, RN Signed: 04/01/2016 8:21:50 PM By: Janet Kocher, NP, Minaal Struckman Entered By: [name] on 04/01/2016 11:43:51 Biela, Sofie Hartigan ([id number]) -------------------------------------------------------------------------------- Problem List Details Patient Name: [name], [name] 04/01/2016 10:45 Date of Service: AM Medical Record [id number] Number: Patient Account Number: [id number] 04-Dec-1945 (70 y.o. Treating RN: Baruch Gouty, RN, BSN, Velva Harman Date of Birth/Sex: Female) Other Clinician: Primary Care Physician: Caryl Bis, ERIC Treating Floretta Petro Referring Physician: Caryl Bis, ERIC Physician/Extender: Weeks in Treatment: 13 Active Problems ICD-10 Encounter Code Description Active Date Diagnosis E10.621 Type 1 diabetes mellitus with foot ulcer 12/31/2015 Yes L89.623 Pressure ulcer of left heel, stage 3  12/31/2015 Yes Z92.21 Personal history of antineoplastic chemotherapy 12/31/2015 Yes Z79.52 Long term (current) use of systemic steroids 12/31/2015 Yes Inactive Problems Resolved Problems Electronic Signature(s) Signed: 04/01/2016 11:56:29 AM By: Janet Kocher, NP, Ta Fair Entered By: Janet Kocher, NP, Jakobee Brackins on 04/01/2016 11:56:29 [name] ([id number]) -------------------------------------------------------------------------------- Progress Note Details Patient Name: [name], [name] 04/01/2016 10:45 Date of Service: AM Medical Record [id number] Number: Patient Account Number: [id number] Sep 28, 1945 (70 y.o. Treating RN: Baruch Gouty, RN, BSN, Velva Harman Date of Birth/Sex: Female) Other Clinician: Primary Care Physician: Caryl Bis, ERIC Treating Prosper Paff Referring Physician: Caryl Bis, ERIC Physician/Extender: Suella Grove in Treatment: 13 Subjective Chief Complaint Information obtained from Patient Mrs. Dees presents for evaluation of her left posterior heel pressure ulcer History of Present Illness (HPI) The following HPI elements were documented for the patient's wound: Location: right heel Quality: Patient reports experiencing a dull pain to affected area(s). Severity: Patient states wound are getting worse. Duration: Patient has had the wound for Timing: Pain in wound is Intermittent (comes and goes Context: The wound appeared gradually over time Modifying Factors: Consults to this date include: local care was done as per the medication given by the PCP which may have been medihoney Associated Signs and Symptoms: Patient reports having difficulty standing for long periods. 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 In the past she was seen for a decubitus ulcer on her right gluteal area. She is known to have  diabetes mellitus type 1 and her most recent A1c was 7.2% Past medical history significant for leukopenia, cholelithiasis, hypertension, chronic diastolic CHF, coronary artery disease,type 1 diabetes mellitus, rheumatoid arthritis, collagen vascular disease, ovarian cancer, status post CABG in 2014 and status post Port-A-Cath insertion by Dr. Leotis Pain in October 2016. She has quit smoking about 20 years ago. She was advised to use Medihoney with calcium alginate pads to be applied over the wound. 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 01/07/2016 -- x-ray of the left heel done -- IMPRESSION: No radiographic evidence of osteomyelitis. If this remains a clinical concern, recommend MR imaging. 02/05/16: returns today for f/u. denies fever, chills, body aches or malaise. no interval changes regarding health status. 02/12/16: pt is doing well. wound has improved. no s/s of infection. no systemic s/s of  infection. She reports AVIYANA, YOKEL. ([id number]) will be on vacation next week, and requests appointment in 2 weeks. 03/18/2016 - is going to be starting her chemotherapy this coming Monday and will have cycles every 3 weeks 04-01-16 Mrs. Dass, accompanied by her husband, presents for evaluation of her left posterior heel stage III pressure ulcer. She states that she started chemotherapy last week. Her chemotherapy is on a cycle of one week on 3 weeks off, with her next treatment scheduled for December 11. There has been an increase in drainage since her last appointment. She denies any pain. They have been using PolyMem silver rope. patient states that she has not been routinely offloading  the left heel while in bed but continues to wear the open back shoes for offloading Objective Constitutional BP within normal limits. afebrile. well nourished; well developed; appears stated age;Marland Kitchen Vitals Time Taken: 10:51 AM, Height: 63 in, Weight: 130 lbs, BMI: 23, Temperature: 99.0 F, Pulse: 76 bpm, Respiratory Rate: 16 breaths/min, Blood Pressure: 115/57 mmHg. Respiratory non-labored respiratory effort. Cardiovascular audible doppler signal DP and PT and peroneal. Musculoskeletal ambulated without assistance; steady gait. Psychiatric appears to make sound judgement and have accurate insight regarding healthcare. oriented to time, place, person and situation. calm, pleasant, conversive. Integumentary (Hair, Skin) left posterior heel ulcer has minimal visible granulation tissue in the wound bed, no periwound erythema, minimal periwound maceration. no induration, no fluctuance, denies pain. Wound #2 status is Open. Original cause of wound was Pressure Injury. The wound is located on the Left Calcaneus. The wound measures 0.7cm length x 0.6cm width x 1cm depth; 0.33cm^2 area and 0.33cm^3 volume. The wound is limited to skin breakdown. There is no tunneling or undermining noted. There is a medium amount of serous drainage noted. The wound margin is flat and intact. There is no granulation [name], [name]. ([id number]) within the wound bed. There is a medium (34-66%) amount of necrotic tissue within the wound bed including Adherent Slough. The periwound skin appearance exhibited: Induration, Maceration, Moist. The periwound skin appearance did not exhibit: Callus, Crepitus, Excoriation, Fluctuance, Friable, Localized Edema, Rash, Scarring, Dry/Scaly, Atrophie Blanche, Cyanosis, Ecchymosis, Hemosiderin Staining, Mottled, Pallor, Rubor, Erythema. Periwound temperature was noted as No Abnormality. The periwound has tenderness on palpation. Assessment Active Problems ICD-10 E10.621 - Type  1 diabetes mellitus with foot ulcer L89.623 - Pressure ulcer of left heel, stage 3 Z92.21 - Personal history of antineoplastic chemotherapy Z79.52 - Long term (current) use of systemic steroids Procedures Wound #2 Wound #2 is a Pressure Ulcer located on the Left Calcaneus . There was an Open Wound debridement with total area of 0.42 sq cm performed by Janet Cousins, NP. with the following instrument(s): Curette to remove Non-Viable tissue/material including Fibrin/Slough after achieving pain control using Lidocaine 4% Topical Solution. A time out was conducted at 11:42, prior to the start of the procedure. A Minimum amount of bleeding was controlled with Pressure. The procedure was tolerated well with a pain level of 0 throughout and a pain level of 0 following the procedure. Post Debridement Measurements: 0.7cm length x 0.6cm width x 1cm depth; 0.33cm^3 volume. Post debridement Stage noted as Category/Stage III. Character of Wound/Ulcer Post Debridement requires further debridement. Severity of Tissue Post Debridement is: Fat layer exposed. Post procedure Diagnosis Wound #2: Same as Pre-Procedure patient is on Lovenox, bleeds briskly, hemostasis obtained with direct operative pressure Plan [name], [name] ([id number]) Wound Cleansing: Wound #2 Left Calcaneus: Cleanse wound with mild soap and water May Shower, gently  pat wound dry prior to applying new dressing. Anesthetic: Wound #2 Left Calcaneus: Topical Lidocaine 4% cream applied to wound bed prior to debridement Skin Barriers/Peri-Wound Care: Wound #2 Left Calcaneus: Skin Prep Primary Wound Dressing: Wound #2 Left Calcaneus: Prisma Ag Secondary Dressing: Wound #2 Left Calcaneus: Boardered Foam Dressing - or Coverllette Bandaid Dressing Change Frequency: Wound #2 Left Calcaneus: Change dressing every other day. Follow-up Appointments: Wound #2 Left Calcaneus: Return Appointment in 1 week. Off-Loading: Wound #2 Left  Calcaneus: Heel suspension boot to: - ELevate on pillows Additional Orders / Instructions: Wound #2 Left Calcaneus: Increase protein intake. Activity as tolerated Other: - INclude Vitamin A, C, ZInc, MVI in diet 1. Change to Prisma every other day 2. Continue offloading 3. Follow-up next week Electronic Signature(s) Signed: 04/01/2016 12:03:20 PM By: Janet Kocher, NP, Jaisha Villacres Entered By: Janet Kocher, NP, Shelden Raborn on 04/01/2016 12:03:20 [name], ZIFF ([id number]) EMYLEE, BOLTER ([id number]) -------------------------------------------------------------------------------- SuperBill Details Patient Name: [name] Date of Service: 04/01/2016 Medical Record Patient Account Number: [id number] Number: Afful, RN, BSN, Treating RN: October 21, 1945 (70 y.o. Velva Harman Date of Birth/Sex: Female) Other Clinician: Primary Care Physician: Caryl Bis, ERIC Treating Jeslie Lowe Referring Physician: Caryl Bis, ERIC Physician/Extender: Suella Grove in Treatment: 13 Diagnosis Coding ICD-10 Codes Code Description E10.621 Type 1 diabetes mellitus with foot ulcer L89.623 Pressure ulcer of left heel, stage 3 Z92.21 Personal history of antineoplastic chemotherapy Z79.52 Long term (current) use of systemic steroids Facility Procedures CPT4 Code: [id number] Description: [id number] - DEBRIDE WOUND 1ST 20 SQ CM OR < ICD-10 Description Diagnosis E10.621 Type 1 diabetes mellitus with foot ulcer L89.623 Pressure ulcer of left heel, stage 3 Modifier: Quantity: 1 Physician Procedures CPT4 Code: [id number] Description: [id number] - WC PHYS DEBR WO ANESTH 20 SQ CM ICD-10 Description Diagnosis E10.621 Type 1 diabetes mellitus with foot ulcer L89.623 Pressure ulcer of left heel, stage 3 Modifier: Quantity: 1 Electronic Signature(s) Signed: 04/01/2016 12:03:35 PM By: Janet Kocher, NP, Rotha Cassels Entered By: Janet Kocher, NP, Shady Padron on 04/01/2016 12:03:35

## 2016-04-04 ENCOUNTER — Inpatient Hospital Stay: Payer: Medicare Other | Attending: Hematology and Oncology

## 2016-04-04 ENCOUNTER — Other Ambulatory Visit: Payer: Self-pay | Admitting: *Deleted

## 2016-04-04 ENCOUNTER — Other Ambulatory Visit: Payer: Self-pay | Admitting: Hematology and Oncology

## 2016-04-04 ENCOUNTER — Telehealth: Payer: Self-pay | Admitting: *Deleted

## 2016-04-04 DIAGNOSIS — Z5111 Encounter for antineoplastic chemotherapy: Secondary | ICD-10-CM | POA: Insufficient documentation

## 2016-04-04 DIAGNOSIS — Z87891 Personal history of nicotine dependence: Secondary | ICD-10-CM | POA: Insufficient documentation

## 2016-04-04 DIAGNOSIS — I999 Unspecified disorder of circulatory system: Secondary | ICD-10-CM | POA: Insufficient documentation

## 2016-04-04 DIAGNOSIS — D72819 Decreased white blood cell count, unspecified: Secondary | ICD-10-CM | POA: Insufficient documentation

## 2016-04-04 DIAGNOSIS — G629 Polyneuropathy, unspecified: Secondary | ICD-10-CM | POA: Diagnosis not present

## 2016-04-04 DIAGNOSIS — E876 Hypokalemia: Secondary | ICD-10-CM | POA: Insufficient documentation

## 2016-04-04 DIAGNOSIS — I5032 Chronic diastolic (congestive) heart failure: Secondary | ICD-10-CM | POA: Diagnosis not present

## 2016-04-04 DIAGNOSIS — I252 Old myocardial infarction: Secondary | ICD-10-CM | POA: Insufficient documentation

## 2016-04-04 DIAGNOSIS — N1330 Unspecified hydronephrosis: Secondary | ICD-10-CM | POA: Insufficient documentation

## 2016-04-04 DIAGNOSIS — C569 Malignant neoplasm of unspecified ovary: Secondary | ICD-10-CM

## 2016-04-04 DIAGNOSIS — Z86718 Personal history of other venous thrombosis and embolism: Secondary | ICD-10-CM | POA: Insufficient documentation

## 2016-04-04 DIAGNOSIS — I2510 Atherosclerotic heart disease of native coronary artery without angina pectoris: Secondary | ICD-10-CM | POA: Diagnosis not present

## 2016-04-04 DIAGNOSIS — C786 Secondary malignant neoplasm of retroperitoneum and peritoneum: Secondary | ICD-10-CM | POA: Insufficient documentation

## 2016-04-04 DIAGNOSIS — I82C11 Acute embolism and thrombosis of right internal jugular vein: Secondary | ICD-10-CM

## 2016-04-04 DIAGNOSIS — K8020 Calculus of gallbladder without cholecystitis without obstruction: Secondary | ICD-10-CM | POA: Insufficient documentation

## 2016-04-04 DIAGNOSIS — C801 Malignant (primary) neoplasm, unspecified: Secondary | ICD-10-CM

## 2016-04-04 DIAGNOSIS — Z9641 Presence of insulin pump (external) (internal): Secondary | ICD-10-CM | POA: Insufficient documentation

## 2016-04-04 DIAGNOSIS — E785 Hyperlipidemia, unspecified: Secondary | ICD-10-CM | POA: Diagnosis not present

## 2016-04-04 DIAGNOSIS — Z794 Long term (current) use of insulin: Secondary | ICD-10-CM | POA: Insufficient documentation

## 2016-04-04 DIAGNOSIS — Z7689 Persons encountering health services in other specified circumstances: Secondary | ICD-10-CM | POA: Diagnosis not present

## 2016-04-04 DIAGNOSIS — M069 Rheumatoid arthritis, unspecified: Secondary | ICD-10-CM | POA: Diagnosis not present

## 2016-04-04 DIAGNOSIS — Z79899 Other long term (current) drug therapy: Secondary | ICD-10-CM | POA: Diagnosis not present

## 2016-04-04 DIAGNOSIS — C787 Secondary malignant neoplasm of liver and intrahepatic bile duct: Secondary | ICD-10-CM | POA: Diagnosis not present

## 2016-04-04 DIAGNOSIS — E108 Type 1 diabetes mellitus with unspecified complications: Secondary | ICD-10-CM | POA: Insufficient documentation

## 2016-04-04 DIAGNOSIS — Z808 Family history of malignant neoplasm of other organs or systems: Secondary | ICD-10-CM | POA: Insufficient documentation

## 2016-04-04 DIAGNOSIS — I10 Essential (primary) hypertension: Secondary | ICD-10-CM | POA: Insufficient documentation

## 2016-04-04 DIAGNOSIS — R188 Other ascites: Secondary | ICD-10-CM | POA: Insufficient documentation

## 2016-04-04 DIAGNOSIS — K769 Liver disease, unspecified: Secondary | ICD-10-CM | POA: Diagnosis not present

## 2016-04-04 DIAGNOSIS — Z8701 Personal history of pneumonia (recurrent): Secondary | ICD-10-CM | POA: Insufficient documentation

## 2016-04-04 DIAGNOSIS — Z7901 Long term (current) use of anticoagulants: Secondary | ICD-10-CM | POA: Insufficient documentation

## 2016-04-04 DIAGNOSIS — K219 Gastro-esophageal reflux disease without esophagitis: Secondary | ICD-10-CM | POA: Diagnosis not present

## 2016-04-04 DIAGNOSIS — Z8719 Personal history of other diseases of the digestive system: Secondary | ICD-10-CM | POA: Insufficient documentation

## 2016-04-04 DIAGNOSIS — Z9049 Acquired absence of other specified parts of digestive tract: Secondary | ICD-10-CM | POA: Insufficient documentation

## 2016-04-04 LAB — CBC WITH DIFFERENTIAL/PLATELET
Basophils Absolute: 0.1 10*3/uL (ref 0–0.1)
Basophils Relative: 1 %
Eosinophils Absolute: 0 10*3/uL (ref 0–0.7)
Eosinophils Relative: 1 %
HCT: 26.2 % — ABNORMAL LOW (ref 35.0–47.0)
Hemoglobin: 9 g/dL — ABNORMAL LOW (ref 12.0–16.0)
Lymphocytes Relative: 12 %
Lymphs Abs: 0.7 10*3/uL — ABNORMAL LOW (ref 1.0–3.6)
MCH: 29.9 pg (ref 26.0–34.0)
MCHC: 34.4 g/dL (ref 32.0–36.0)
MCV: 87.1 fL (ref 80.0–100.0)
Monocytes Absolute: 0.6 10*3/uL (ref 0.2–0.9)
Monocytes Relative: 10 %
Neutro Abs: 4.5 10*3/uL (ref 1.4–6.5)
Neutrophils Relative %: 76 %
Platelets: 221 10*3/uL (ref 150–440)
RBC: 3.01 MIL/uL — ABNORMAL LOW (ref 3.80–5.20)
RDW: 15.6 % — ABNORMAL HIGH (ref 11.5–14.5)
WBC: 5.9 10*3/uL (ref 3.6–11.0)

## 2016-04-04 LAB — MAGNESIUM: Magnesium: 1.5 mg/dL — ABNORMAL LOW (ref 1.7–2.4)

## 2016-04-04 MED ORDER — MAGNESIUM OXIDE 400 (241.3 MG) MG PO TABS: 400.0000 mg | ORAL_TABLET | Freq: Every day | ORAL | 2 refills | Status: DC

## 2016-04-04 MED ORDER — ENOXAPARIN SODIUM 60 MG/0.6ML ~~LOC~~ SOLN: 60.0000 mg | Freq: Two times a day (BID) | SUBCUTANEOUS | 0 refills | Status: DC

## 2016-04-04 MED ORDER — APIXABAN 5 MG PO TABS: 5.0000 mg | ORAL_TABLET | Freq: Two times a day (BID) | ORAL | 1 refills | Status: DC

## 2016-04-04 NOTE — Telephone Encounter (Signed)
[facility] notified of need for Prior Authorization for eliquis, awaiting response from [facility].  Patient called and informed to continue lovenox bid as ordered and that we will let her know when we get the prior authorization in order to start the eliquis.  Patient instructed to start taking magnesium once a day per Dr. Mike Gip, voiced understanding.

## 2016-04-08 ENCOUNTER — Other Ambulatory Visit: Payer: Medicare Other

## 2016-04-08 ENCOUNTER — Other Ambulatory Visit: Payer: Self-pay | Admitting: *Deleted

## 2016-04-08 ENCOUNTER — Encounter: Payer: Medicare Other | Admitting: Surgery

## 2016-04-08 ENCOUNTER — Telehealth: Payer: Self-pay | Admitting: *Deleted

## 2016-04-08 DIAGNOSIS — E785 Hyperlipidemia, unspecified: Secondary | ICD-10-CM

## 2016-04-08 DIAGNOSIS — C569 Malignant neoplasm of unspecified ovary: Secondary | ICD-10-CM

## 2016-04-08 DIAGNOSIS — C786 Secondary malignant neoplasm of retroperitoneum and peritoneum: Secondary | ICD-10-CM

## 2016-04-08 DIAGNOSIS — E10621 Type 1 diabetes mellitus with foot ulcer: Secondary | ICD-10-CM | POA: Diagnosis not present

## 2016-04-08 DIAGNOSIS — C801 Malignant (primary) neoplasm, unspecified: Secondary | ICD-10-CM

## 2016-04-08 MED ORDER — ENOXAPARIN SODIUM 60 MG/0.6ML ~~LOC~~ SOLN: 60.0000 mg | Freq: Two times a day (BID) | SUBCUTANEOUS | 0 refills | Status: DC

## 2016-04-08 NOTE — Telephone Encounter (Signed)
Error opening encounter

## 2016-04-08 NOTE — Telephone Encounter (Signed)
Called patient to inform her that the eliquis has been denied and that she will need to stay on lovenox until appt on Monday and then to discuss options with Dr. Mike Gip on the appt.  Voiced understanding, refill on lovenox sent to pharmacy.

## 2016-04-09 LAB — LIPID PANEL
CHOL/HDL RATIO: 3.5 ratio (ref 0.0–4.4)
Cholesterol, Total: 166 mg/dL (ref 100–199)
HDL: 48 mg/dL (ref >39)
LDL Calculated: 100 mg/dL — ABNORMAL HIGH (ref 0–99)
TRIGLYCERIDES: 90 mg/dL (ref 0–149)
VLDL Cholesterol Cal: 18 mg/dL (ref 5–40)

## 2016-04-09 LAB — HEPATIC FUNCTION PANEL
ALBUMIN: 3.7 g/dL (ref 3.5–4.8)
ALT: 9 IU/L (ref 0–32)
AST: 11 IU/L (ref 0–40)
Alkaline Phosphatase: 73 IU/L (ref 39–117)
Bilirubin Total: 0.3 mg/dL (ref 0.0–1.2)
Bilirubin, Direct: 0.09 mg/dL (ref 0.00–0.40)
Total Protein: 6.9 g/dL (ref 6.0–8.5)

## 2016-04-09 NOTE — Progress Notes (Signed)
RONNESHIA, CORRIE ([id number]) Visit Report for 04/08/2016 Arrival Information Details Patient Name: [name], [name] Date of Service: 04/08/2016 12:45 PM Medical Record Number: [id number] Patient Account Number: [id number] Date of Birth/Sex: 1946-01-02 (70 y.o. Female) Treating RN: Afful, RN, BSN, Velva Harman Primary Care Physician: Caryl Bis, ERIC Other Clinician: Referring Physician: Caryl Bis, ERIC Treating Physician/Extender: Frann Rider in Treatment: 14 Visit Information History Since Last Visit All ordered tests and consults were completed: No Patient Arrived: Cane Added or deleted any medications: No Arrival Time: 12:52 Any new allergies or adverse reactions: No Accompanied By: hubby Had a fall or experienced change in No Transfer Assistance: None activities of daily living that may affect Patient Identification Verified: Yes risk of falls: Secondary Verification Process Yes Signs or symptoms of abuse/neglect since last No Completed: visito Patient Requires Transmission- No Hospitalized since last visit: No Based Precautions: Has Dressing in Place as Prescribed: Yes Patient Has Alerts: Yes Pain Present Now: Yes Patient Alerts: Patient on Blood Thinner lovenox BID DMII Electronic Signature(s) Signed: 04/08/2016 3:48:43 PM By: Regan Lemming BSN, RN Entered By: Regan Lemming on 04/08/2016 12:54:49 [name] ([id number]) -------------------------------------------------------------------------------- Encounter Discharge Information Details Patient Name: [name] Date of Service: 04/08/2016 12:45 PM Medical Record Number: [id number] Patient Account Number: [id number] Date of Birth/Sex: 11/01/1945 (70 y.o. Female) Treating RN: Baruch Gouty, RN, BSN, Velva Harman Primary Care Physician: Caryl Bis, ERIC Other Clinician: Referring Physician: Caryl Bis, ERIC Treating Physician/Extender: Frann Rider in Treatment: 14 Encounter Discharge Information  Items Discharge Pain Level: 0 Discharge Condition: Stable Ambulatory Status: Cane Discharge Destination: Home Transportation: Private Auto Accompanied By: Micheline Rough Schedule Follow-up Appointment: No Medication Reconciliation completed No and provided to Patient/Care Brandonn Capelli: Provided on Clinical Summary of Care: 04/08/2016 Form Type Recipient Paper Patient SR Electronic Signature(s) Signed: 04/08/2016 1:22:40 PM By: Regan Lemming BSN, RN Previous Signature: 04/08/2016 1:18:30 PM Version By: Ruthine Dose Entered By: Regan Lemming on 04/08/2016 13:22:40 [name] ([id number]) -------------------------------------------------------------------------------- Lower Extremity Assessment Details Patient Name: [name] Date of Service: 04/08/2016 12:45 PM Medical Record Number: [id number] Patient Account Number: [id number] Date of Birth/Sex: 05/10/1945 (70 y.o. Female) Treating RN: Afful, RN, BSN, Velva Harman Primary Care Physician: Caryl Bis, ERIC Other Clinician: Referring Physician: Caryl Bis, ERIC Treating Physician/Extender: Frann Rider in Treatment: 14 Vascular Assessment Pulses: Posterior Tibial Dorsalis Pedis Palpable: [Left:Yes] Extremity colors, hair growth, and conditions: Extremity Color: [Left:Normal] Hair Growth on Extremity: [Left:No] Temperature of Extremity: [Left:Warm] Toe Nail Assessment Left: Right: Thick: No Discolored: No Deformed: No Improper Length and Hygiene: No Electronic Signature(s) Signed: 04/08/2016 3:48:43 PM By: Regan Lemming BSN, RN Entered By: Regan Lemming on 04/08/2016 12:55:37 [name] ([id number]) -------------------------------------------------------------------------------- Multi Wound Chart Details Patient Name: [name] Date of Service: 04/08/2016 12:45 PM Medical Record Number: [id number] Patient Account Number: [id number] Date of Birth/Sex: Sep 02, 1945 (70 y.o. Female) Treating RN: Baruch Gouty, RN, BSN,  Velva Harman Primary Care Physician: Caryl Bis, ERIC Other Clinician: Referring Physician: Caryl Bis, ERIC Treating Physician/Extender: Frann Rider in Treatment: 14 Vital Signs Height(in): 63 Pulse(bpm): 81 Weight(lbs): 130 Blood Pressure 134/66 (mmHg): Body Mass Index(BMI): 23 Temperature(F): 98.3 Respiratory Rate 16 (breaths/min): Photos: [2:No Photos] [N/A:N/A] Wound Location: [2:Left Calcaneus] [N/A:N/A] Wounding Event: [2:Pressure Injury] [N/A:N/A] Primary Etiology: [2:Pressure Ulcer] [N/A:N/A] Secondary Etiology: [2:Diabetic Wound/Ulcer of the Lower Extremity] [N/A:N/A] Comorbid History: [2:Cataracts, Type I Diabetes, Rheumatoid Arthritis, Osteoarthritis, Neuropathy, Received Chemotherapy] [N/A:N/A] Date Acquired: [2:07/15/2015] [N/A:N/A] Weeks of Treatment: [2:14] [N/A:N/A] Wound Status: [2:Open] [N/A:N/A] Measurements L x W x D 0.5x0.5x0.7 [N/A:N/A] (cm) Area (cm) : [2:0.196] [N/A:N/A] Volume (  cm) : [2:0.137] [N/A:N/A] % Reduction in Area: [2:16.90%] [N/A:N/A] % Reduction in Volume: -45.70% [N/A:N/A] Classification: [2:Category/Stage III] [N/A:N/A] HBO Classification: [2:Grade 1] [N/A:N/A] Exudate Amount: [2:Medium] [N/A:N/A] Exudate Type: [2:Serous] [N/A:N/A] Exudate Color: [2:amber] [N/A:N/A] Wound Margin: [2:Flat and Intact] [N/A:N/A] Granulation Amount: [2:None Present (0%)] [N/A:N/A] Necrotic Amount: [2:Medium (34-66%)] [N/A:N/A] Exposed Structures: [2:Fascia: No Fat: No] [N/A:N/A] Tendon: No Muscle: No Joint: No Bone: No Limited to Skin Breakdown Epithelialization: None N/A N/A Periwound Skin Texture: Induration: Yes N/A N/A Edema: No Excoriation: No Callus: No Crepitus: No Fluctuance: No Friable: No Rash: No Scarring: No Periwound Skin Maceration: Yes N/A N/A Moisture: Moist: Yes Dry/Scaly: No Periwound Skin Color: Atrophie Blanche: No N/A N/A Cyanosis: No Ecchymosis: No Erythema: No Hemosiderin Staining: No Mottled: No Pallor:  No Rubor: No Temperature: No Abnormality N/A N/A Tenderness on Yes N/A N/A Palpation: Wound Preparation: Ulcer Cleansing: N/A N/A Rinsed/Irrigated with Saline Topical Anesthetic Applied: Other: lidocaine 4% Treatment Notes Electronic Signature(s) Signed: 04/08/2016 3:48:43 PM By: Regan Lemming BSN, RN Entered By: Regan Lemming on 04/08/2016 13:07:59 [name] ([id number]) -------------------------------------------------------------------------------- Multi-Disciplinary Care Plan Details Patient Name: [name] Date of Service: 04/08/2016 12:45 PM Medical Record Number: [id number] Patient Account Number: [id number] Date of Birth/Sex: 1946-02-20 (70 y.o. Female) Treating RN: Afful, RN, BSN, [place] Primary Care Physician: Caryl Bis, ERIC Other Clinician: Referring Physician: Caryl Bis, ERIC Treating Physician/Extender: Frann Rider in Treatment: 14 Active Inactive Orientation to the Wound Care Program Nursing Diagnoses: Knowledge deficit related to the wound healing center program Goals: Patient/caregiver will verbalize understanding of the [facility] Date Initiated: 12/31/2015 Goal Status: Active Interventions: Provide education on orientation to the wound center Notes: Pressure Nursing Diagnoses: Knowledge deficit related to causes and risk factors for pressure ulcer development Knowledge deficit related to management of pressures ulcers Potential for impaired tissue integrity related to pressure, friction, moisture, and shear Goals: Patient will remain free from development of additional pressure ulcers Date Initiated: 12/31/2015 Goal Status: Active Patient will remain free of pressure ulcers Date Initiated: 12/31/2015 Goal Status: Active Patient/caregiver will verbalize risk factors for pressure ulcer development Date Initiated: 12/31/2015 Goal Status: Active Patient/caregiver will verbalize understanding of pressure ulcer  management Date Initiated: 12/31/2015 Goal Status: Active Interventions: NECO, VISCUSI ([id number]) Assess: immobility, friction, shearing, incontinence upon admission and as needed Assess offloading mechanisms upon admission and as needed Assess potential for pressure ulcer upon admission and as needed Provide education on pressure ulcers Treatment Activities: Patient referred for pressure reduction/relief devices : 12/31/2015 Notes: Wound/Skin Impairment Nursing Diagnoses: Impaired tissue integrity Knowledge deficit related to smoking impact on wound healing Knowledge deficit related to ulceration/compromised skin integrity Goals: Patient/caregiver will verbalize understanding of skin care regimen Date Initiated: 12/31/2015 Goal Status: Active Ulcer/skin breakdown will have a volume reduction of 30% by week 4 Date Initiated: 12/31/2015 Goal Status: Active Ulcer/skin breakdown will have a volume reduction of 50% by week 8 Date Initiated: 12/31/2015 Goal Status: Active Ulcer/skin breakdown will have a volume reduction of 80% by week 12 Date Initiated: 12/31/2015 Goal Status: Active Ulcer/skin breakdown will heal within 14 weeks Date Initiated: 12/31/2015 Goal Status: Active Interventions: Assess patient/caregiver ability to obtain necessary supplies Assess patient/caregiver ability to perform ulcer/skin care regimen upon admission and as needed Assess ulceration(s) every visit Provide education on ulcer and skin care Treatment Activities: Skin care regimen initiated : 12/31/2015 Topical wound management initiated : 12/31/2015 Notes: KADEN, NEUBERGER ([id number]) Electronic Signature(s) Signed: 04/08/2016 3:48:43 PM By: Regan Lemming BSN, RN Entered By:  Regan Lemming on 04/08/2016 12:58:49 LOVETTE, PUCCI ([id number]) -------------------------------------------------------------------------------- Pain Assessment Details Patient Name: ELENOA, SPRAGGINS Date of Service:  04/08/2016 12:45 PM Medical Record Number: [id number] Patient Account Number: [id number] Date of Birth/Sex: [birth year] (70 y.o. Female) Treating RN: Afful, RN, BSN, Velva Harman Primary Care Physician: Caryl Bis, ERIC Other Clinician: Referring Physician: Caryl Bis, ERIC Treating Physician/Extender: Frann Rider in Treatment: 14 Active Problems Location of Pain Severity and Description of Pain Patient Has Paino Yes Site Locations Pain Location: Pain in Ulcers Character of Pain Describe the Pain: Tender Pain Management and Medication Current Pain Management: How does your pain impact your activities of daily livingo Sleep: Yes Bathing: Yes Appetite: Yes Relationship With Others: Yes Bladder Continence: Yes Emotions: Yes Bowel Continence: Yes Work: Yes Toileting: Yes Drive: Yes Dressing: Yes Hobbies: Yes Electronic Signature(s) Signed: 04/08/2016 3:48:43 PM By: Regan Lemming BSN, RN Entered By: Regan Lemming on 04/08/2016 12:55:05 [name] ([id number]) -------------------------------------------------------------------------------- Patient/Caregiver Education Details Patient Name: [name] Date of Service: 04/08/2016 12:45 PM Medical Record Number: [id number] Patient Account Number: [id number] Date of Birth/Gender: 1946-01-15 (70 y.o. Female) Treating RN: Baruch Gouty, RN, BSN, Velva Harman Primary Care Physician: Caryl Bis, ERIC Other Clinician: Referring Physician: Caryl Bis, ERIC Treating Physician/Extender: Frann Rider in Treatment: 14 Education Assessment Education Provided To: Patient Education Topics Provided Pressure: Methods: Explain/Verbal Responses: State content correctly Welcome To [facility] [place]: Methods: Explain/Verbal Responses: State content correctly Wound/Skin Impairment: Methods: Explain/Verbal Responses: State content correctly Electronic Signature(s) Signed: 04/08/2016 3:48:43 PM By: Regan Lemming BSN, RN Entered By: Regan Lemming  on 04/08/2016 13:22:59 [name] ([id number]) -------------------------------------------------------------------------------- Wound Assessment Details Patient Name: [name] Date of Service: 04/08/2016 12:45 PM Medical Record Number: [id number] Patient Account Number: [id number] Date of Birth/Sex: 10/05/1945 (70 y.o. Female) Treating RN: Afful, RN, BSN, [place] Primary Care Physician: Caryl Bis, ERIC Other Clinician: Referring Physician: Caryl Bis, ERIC Treating Physician/Extender: Frann Rider in Treatment: 14 Wound Status Wound Number: 2 Primary Pressure Ulcer Etiology: Wound Location: Left Calcaneus Secondary Diabetic Wound/Ulcer of the Lower Wounding Event: Pressure Injury Etiology: Extremity Date Acquired: 07/15/2015 Wound Open Weeks Of Treatment: 14 Status: Clustered Wound: No Comorbid Cataracts, Type I Diabetes, History: Rheumatoid Arthritis, Osteoarthritis, Neuropathy, Received Chemotherapy Wound Measurements Length: (cm) 0.5 Width: (cm) 0.5 Depth: (cm) 0.7 Area: (cm) 0.196 Volume: (cm) 0.137 % Reduction in Area: 16.9% % Reduction in Volume: -45.7% Epithelialization: None Tunneling: No Undermining: No Wound Description Classification: Category/Stage III Foul Odor Diabetic Severity (Wagner): Grade 1 Wound Margin: Flat and Intact Exudate Amount: Medium Exudate Type: Serous Exudate Color: amber After Cleansing: No Wound Bed Granulation Amount: None Present (0%) Exposed Structure Necrotic Amount: Medium (34-66%) Fascia Exposed: No Necrotic Quality: Adherent Slough Fat Layer Exposed: No Tendon Exposed: No Muscle Exposed: No Joint Exposed: No Bone Exposed: No Limited to Skin Breakdown Periwound Skin Texture Texture Color No Abnormalities Noted: No No Abnormalities Noted: No HEALANI, CORNISH. ([id number]) Callus: No Atrophie Blanche: No Crepitus: No Cyanosis: No Excoriation: No Ecchymosis: No Fluctuance: No Erythema:  No Friable: No Hemosiderin Staining: No Induration: Yes Mottled: No Localized Edema: No Pallor: No Rash: No Rubor: No Scarring: No Temperature / Pain Moisture Temperature: No Abnormality No Abnormalities Noted: No Tenderness on Palpation: Yes Dry / Scaly: No Maceration: Yes Moist: Yes Wound Preparation Ulcer Cleansing: Rinsed/Irrigated with Saline Topical Anesthetic Applied: Other: lidocaine 4%, Treatment Notes Wound #2 (Left Calcaneus) 1. Cleansed with: Clean wound with Normal Saline 4. Dressing Applied: Other dressing (specify in notes) Notes polymem and coverlette bandaid Electronic  Signature(s) Signed: 04/08/2016 3:48:43 PM By: Regan Lemming BSN, RN Entered By: Regan Lemming on 04/08/2016 12:58:44 [name] ([id number]) -------------------------------------------------------------------------------- Vitals Details Patient Name: [name] Date of Service: 04/08/2016 12:45 PM Medical Record Number: [id number] Patient Account Number: [id number] Date of Birth/Sex: 1945/11/06 (70 y.o. Female) Treating RN: Afful, RN, BSN, [place] Primary Care Physician: Caryl Bis, ERIC Other Clinician: Referring Physician: Caryl Bis, ERIC Treating Physician/Extender: Frann Rider in Treatment: 14 Vital Signs Time Taken: 12:55 Temperature (F): 98.3 Height (in): 63 Pulse (bpm): 81 Weight (lbs): 130 Respiratory Rate (breaths/min): 16 Body Mass Index (BMI): 23 Blood Pressure (mmHg): 134/66 Reference Range: 80 - 120 mg / dl Electronic Signature(s) Signed: 04/08/2016 3:48:43 PM By: Regan Lemming BSN, RN Entered By: Regan Lemming on 04/08/2016 12:55:23

## 2016-04-09 NOTE — Progress Notes (Signed)
[facility] day:  04/11/2016   Chief Complaint: [name] is a 70 y.o. female with stage IV ovarian cancer who is seen for assessment prior to cycle #2 carboplatin and gemcitabine.  HPI:  The patient was last seen in the medical oncology clinic on 03/31/2016.  At that time, she was day 11 of cycle #1 carboplatin and gemcitabine. She feels good.  Upper abdominal fullness had improved.  She had hypomagnesemia (1.4).  She received IV magnesium.  Platelet count was 107,000.  We discussed a switch in her Lovenox to Eliquis.  Follow-up platelet count was 221,000 on 04/04/2016.  Her insurance company denied Eliquis and the appeal.  During the interim, she has done well.  She voices no complaint.  She has been taking 1 magnesium pill a day, anymore and she has diarrhea.   Past Medical History:  Diagnosis Date  . CAD (coronary artery disease)    a. 09/2012 Cath: LM nl, LAD 95p, 67m LCX 953mOM2 50, RCA 100.  . Marland Kitchenholelithiasis   . Chronic diastolic CHF (congestive heart failure) ([place]  . Collagen vascular disease ([place]  . Esophageal stricture   . Exertional shortness of breath   . GERD (gastroesophageal reflux disease)   . H/O hiatal hernia   . Herniated disc   . History of pancytopenia   . Hyperlipidemia   . Hypertension   . Hypokalemia   . Leukopenia 2012   s/p bone marrow biopsy, Dr. PaMa Hillock. NSTEMI (non-ST elevated myocardial infarction) (HCParsons5/2014   "mild" (10/18/2012)  . Ovarian cancer ([place]2016   chemo  . Pneumonia 2013; 08/2012   "one lung; double" (10/18/2012)  . Rheumatoid arthritis(714.0)   . Type I diabetes mellitus (HCG[place]   "dx'd in 1957" (10/18/2012)    Past Surgical History:  Procedure Laterality Date  . ABDOMINAL HYSTERECTOMY  06/15/2015   Dx L/S, EXLAP TAH BSO omentectomy RSRx colostomy diaphragm resection stripping  . CARDIAC CATHETERIZATION  10/18/2012   "first one was today" (10/18/2012)  . CATARACT EXTRACTION  W/ INTRAOCULAR LENS IMPLANT Right 2010  . CHOLECYSTECTOMY  06/15/2015   combined case with ovarian cancer debulking  . COLON SURGERY    . CORONARY ARTERY BYPASS GRAFT N/A 10/19/2012   Procedure: CORONARY ARTERY BYPASS GRAFTING (CABG);  Surgeon: StMelrose NakayamaMD;  Location: [facility] Service: Open Heart Surgery;  Laterality: N/A;  . ESOPHAGEAL DILATION     "3 or 4 times" (10/19/2011)  . ESOPHAGOGASTRODUODENOSCOPY  2012   Dr. IfMinna Merritts. OSTOMY    . OVARY SURGERY     removal  . PERIPHERAL VASCULAR CATHETERIZATION N/A 03/02/2015   Procedure: PoGlori Luisath Insertion;  Surgeon: JaAlgernon HuxleyMD;  [facility];  Service: Cardiovascular;  Laterality: N/A;  . TUBAL LIGATION  1970    Family History  Problem Relation Age of Onset  . Diabetes Mother   . Arthritis Mother   . Diabetes Father   . Arthritis Father   . Bone cancer Sister     Social History:  reports that she quit smoking about 21 years ago. Her smoking use included Cigarettes. She has a 30.00 pack-year smoking history. She has never used smokeless tobacco. She reports that she does not drink alcohol or use drugs.  She is accompanied by her husband today.  Allergies:  Allergies  Allergen Reactions  . Codeine Nausea And Vomiting  . Latex Rash  Current Medications: Current Outpatient Prescriptions  Medication Sig Dispense Refill  . amLODipine (NORVASC) 5 MG tablet Take by mouth.    . Calcium-Vitamin D 600-200 MG-UNIT tablet Take 2 tablets by mouth daily.     Marland Kitchen enoxaparin (LOVENOX) 60 MG/0.6ML injection Inject 0.6 mLs (60 mg total) into the skin every 12 (twelve) hours. 10 Syringe 0  . folic acid (FOLVITE) 1 MG tablet TAKE 1 TABLET BY MOUTH ONCE A DAY 90 tablet 3  . furosemide (LASIX) 80 MG tablet Take 80 mg by mouth daily as needed.     . Insulin Human (INSULIN PUMP) SOLN Pt uses Humalog insulin.    Marland Kitchen insulin lispro (HUMALOG) 100 UNIT/ML injection Inject 0.06 mLs (6 Units total) into the skin daily. 30 mL  3  . lidocaine-prilocaine (EMLA) cream Apply 1 application topically as needed (prior to accessing port).    . loratadine (CLARITIN) 10 MG tablet Take 1 tablet by mouth daily as needed.    Marland Kitchen losartan (COZAAR) 100 MG tablet Take 1 tablet (100 mg total) by mouth daily. 90 tablet 1  . magnesium oxide (MAG-OX) 400 (241.3 Mg) MG tablet Take 1 tablet (400 mg total) by mouth daily. 30 tablet 2  . methotrexate (RHEUMATREX) 2.5 MG tablet Take by mouth.    . metoprolol tartrate (LOPRESSOR) 25 MG tablet TAKE 1 TABLET BY MOUTH TWICE A DAY AS NEEDED 180 tablet 3  . ondansetron (ZOFRAN) 8 MG tablet TAKE 1 TABLET BY MOUTH EVERY 8 HOURS AS NEEDED FOR NAUSEA OR VOMITING 30 tablet 1  . ONE TOUCH ULTRA TEST test strip     . oxyCODONE (OXY IR/ROXICODONE) 5 MG immediate release tablet Take 5 mg by mouth every 6 (six) hours as needed. Reported on 11/17/2015    . pantoprazole (PROTONIX) 40 MG tablet TAKE 1 TABLET BY MOUTH TWICE A DAY 180 tablet 1  . potassium chloride SA (K-DUR,KLOR-CON) 20 MEQ tablet TAKE 2 TABLETS BY MOUTH ONCE A DAY 60 tablet 1  . predniSONE (DELTASONE) 5 MG tablet Take 5 mg by mouth as needed. Reported on 09/17/2015    . ranitidine (ZANTAC) 150 MG tablet Take 1 tablet (150 mg total) by mouth 2 (two) times daily. (Patient taking differently: Take 150 mg by mouth 2 (two) times daily as needed. ) 180 tablet 2  . tamoxifen (NOLVADEX) 20 MG tablet Take 1 tablet (20 mg total) by mouth daily. 30 tablet 1  . vitamin C (ASCORBIC ACID) 500 MG tablet Take 500 mg by mouth daily.    Marland Kitchen zinc gluconate 50 MG tablet Take 50 mg by mouth daily.    Marland Kitchen apixaban (ELIQUIS) 5 MG TABS tablet Take 1 tablet (5 mg total) by mouth 2 (two) times daily. (Patient not taking: Reported on 04/11/2016) 60 tablet 1   No current facility-administered medications for this visit.    Facility-Administered Medications Ordered in Other Visits  Medication Dose Route Frequency Provider Last Rate Last Dose  . heparin lock flush 100 unit/mL   500 Units Intravenous Once Lequita Asal, MD      . sodium chloride 0.9 % injection 10 mL  10 mL Intracatheter PRN Lequita Asal, MD      . sodium chloride 0.9 % injection 10 mL  10 mL Intravenous PRN Lequita Asal, MD   10 mL at 04/13/15 0852  . sodium chloride flush (NS) 0.9 % injection 10 mL  10 mL Intravenous PRN Lequita Asal, MD   10 mL at 04/11/16 [phone]  Review of Systems:  GENERAL:  Feels good.  No fever, chills or sweats.  Weight up 1 pound. PERFORMANCE STATUS (ECOG):  1 HEENT:  No visual changes, sore throat, mouth sores or tenderness. Lungs:  No shortness of breath or cough.  No hemoptysis. Cardiac:  No chest pain, palpitations, orthopnea, or PND. GI:  Minimal upper abdominal fullness.  No constipation, diarrhea, melena or hematochezia. GU:  No urgency, frequency, dysuria, or hematuria. Musculoskeletal:  Severe rheumatoid arthritis (off MTX).  Osteoporosis.  No back pain. No muscle tenderness. Extremities:  No pain or swelling. Skin:  Bruising with Lovenox injections.  No rashes or skin changes. Neuro:  Neuropathy (stable).  No headache, numbness or weakness, balance or coordination issues. Endocrine:  Diabetes on an insulin pump.  No thyroid issues, hot flashes or night sweats. Psych:  No mood changes, depression or anxiety. Pain: No pain. Review of systems:  All other systems reviewed and found to be negative.  Physical Exam: Blood pressure 115/67, pulse 83, temperature 97.1 F (36.2 C), temperature source Tympanic, resp. rate 18, weight 140 lb 10.5 oz (63.8 kg). GENERAL:  Well developed, well nourished woman sitting comfortably in the exam room in no acute distress. MENTAL STATUS:  Alert and oriented to person, place and time. HEAD:  Curly gray hair.  Normocephalic, atraumatic, face symmetric, no Cushingoid features. EYES:  Gold rimmed glasses.  Blue eyes.  No conjunctivitis or scleral icterus.  ENT: Oropharynx clear without lesion. Tongue normal.  Mucous membranes moist.  RESPIRATORY: Clear to auscultation without rales, wheezes or rhonchi. CARDIOVASCULAR: Regular rate and rhythm without murmur, rub or gallop. ABDOMEN: Soft, non-tender with active bowel sounds and no appreciable hepatosplenomegaly. No palpable nodularity or masses. SKIN: No rashes, ulcers, or bruises. EXTREMITIES:  No edema, skin discoloration or tenderness. No palpable cords. LYMPH NODES: No palpable cervical, supraclavicular, axillary or inguinal adenopathy  NEUROLOGICAL: Appropriate. PSYCH: Appropriate.   Infusion on 04/11/2016  Component Date Value Ref Range Status  . WBC 04/11/2016 4.0  3.6 - 11.0 K/uL Final  . RBC 04/11/2016 3.16* 3.80 - 5.20 MIL/uL Final  . Hemoglobin 04/11/2016 9.7* 12.0 - 16.0 g/dL Final  . HCT 04/11/2016 27.8* 35.0 - 47.0 % Final  . MCV 04/11/2016 87.8  80.0 - 100.0 fL Final  . MCH 04/11/2016 30.5  26.0 - 34.0 pg Final  . MCHC 04/11/2016 34.8  32.0 - 36.0 g/dL Final  . RDW 04/11/2016 17.1* 11.5 - 14.5 % Final  . Platelets 04/11/2016 283  150 - 440 K/uL Final  . Neutrophils Relative % 04/11/2016 67  % Final  . Neutro Abs 04/11/2016 2.7  1.4 - 6.5 K/uL Final  . Lymphocytes Relative 04/11/2016 15  % Final  . Lymphs Abs 04/11/2016 0.6* 1.0 - 3.6 K/uL Final  . Monocytes Relative 04/11/2016 16  % Final  . Monocytes Absolute 04/11/2016 0.6  0.2 - 0.9 K/uL Final  . Eosinophils Relative 04/11/2016 1  % Final  . Eosinophils Absolute 04/11/2016 0.0  0 - 0.7 K/uL Final  . Basophils Relative 04/11/2016 1  % Final  . Basophils Absolute 04/11/2016 0.0  0 - 0.1 K/uL Final  . Sodium 04/11/2016 132* 135 - 145 mmol/L Final  . Potassium 04/11/2016 4.7  3.5 - 5.1 mmol/L Final  . Chloride 04/11/2016 102  101 - 111 mmol/L Final  . CO2 04/11/2016 26  22 - 32 mmol/L Final  . Glucose, Bld 04/11/2016 209* 65 - 99 mg/dL Final  . BUN 04/11/2016 21* 6 - 20 mg/dL  Final  . Creatinine, Ser 04/11/2016 0.87  0.44 - 1.00 mg/dL Final  . Calcium 04/11/2016  8.7* 8.9 - 10.3 mg/dL Final  . Total Protein 04/11/2016 6.7  6.5 - 8.1 g/dL Final  . Albumin 04/11/2016 3.1* 3.5 - 5.0 g/dL Final  . AST 04/11/2016 16  15 - 41 U/L Final  . ALT 04/11/2016 10* 14 - 54 U/L Final  . Alkaline Phosphatase 04/11/2016 58  38 - 126 U/L Final  . Total Bilirubin 04/11/2016 0.4  0.3 - 1.2 mg/dL Final  . GFR calc non Af Amer 04/11/2016 >60  >60 mL/min Final  . GFR calc Af Amer 04/11/2016 >60  >60 mL/min Final   Comment: (NOTE) The eGFR has been calculated using the CKD EPI equation. This calculation has not been validated in all clinical situations. eGFR's persistently <60 mL/min signify possible Chronic Kidney Disease.   . Anion gap 04/11/2016 4* 5 - 15 Final  . Magnesium 04/11/2016 1.6* 1.7 - 2.4 mg/dL Final    Assessment:  JAMYAH FOLK is a 70 y.o. female with stage IV ovarian cancer.  She presented with abdominal discomfort and bloating.  Omental biopsy on 02/23/2015 revealed metastatic high grade serous carcinoma, consistent with gynecologic origin.   She was initially diagnosed with clinical stage IIIC (T3cN1Mx).  CA125 was 707 on 02/17/2015.  Abdomen and pelvic CT scan on 02/13/2015 revealed bilateral mass-like adnexal regions (right adnexal mass 5.6 x 5.0 cm and the left adnexa mass 10.3 x 6.0 x 9.9 cm).  There was a large amount of soft tissue throughout the peritoneal cavity involving the omentum and other peritoneal surfaces.  There was a small volume ascites. There was a peripheral 3.3 x 1.9 cm low-attenuation lesion overlying the right lobe of the liver , likely representing a serosal implant. There was 1.4 x 2.2 cm ill-defined peripheral lesion within the inferior aspect of segment 6 adjacent to the ampulla in the duodenum.   She received 4 cycles of neoadjuvant carboplatin and Taxol (03/05/2015 - 05/22/2015).  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  She had loose stools on oral magnesium.  She was initially on Neurontin then switched Lyrica with  cycle #3.  Cycle #4 was notable for neutropenia (ANC 300) requiring GCSF x 3 days.    CA125 was 802.9 on 03/30/2015, 567.9 on 04/13/2015, 168.8 on 05/15/2015, 85.2 on 07/17/2015, 68.6 on 07/28/2015, 34.5 on 09/17/2015, 20.7 on 11/06/2015, 49 on 01/22/2016, 106.1 on 02/22/2016, 138.8 on 03/07/2016, and 220.8 on 03/21/2016.  Abdominal and pelvic CT scan on 04/28/2015 revealed decreasing bilateral ovarian masses.   The left adnexal mass measured 4.0 x 6.1 cm (previously 5.0 x 8.5 cm). The right ovary measured 3.8 x 4.9 cm (previously 4.7 x 5.6 cm).  There was improved peritoneal carcinomatosis.  There was a small amount of ascites.  The hepatic dome lesion was stable. The previously seen right hepatic lobe lesion was not well-visualized.  There was a small left pleural effusion and trace right pleural effusion.  The nodular lesion at the ampulla of Vater, extending into the duodenum was stable.  There was no biliary ductal dilatation.  She underwent exploratory laparotomy, lysis of adhesions, total abdominal hysterectomy with bilateral salpingo-oophorectomy, infracolic omentectomy, optimal tumor debulking(< 1 cm), recto-sigmoid resection with creation of end colostomy, cholecystectomy, mobilization of splenic flexure and liver with diaphragmatic stripping on 06/15/2015. The right diaphragm was cleared of tumor. During dissection, the diaphragm was entered and closed with sutures.   She has had a  recurrent right sided pleural effusion.  She underwent thoracentesis of 650 cc on post-operative day 3.  She was [facility] to [facility] on 07/01/2015 and 07/07/2015 for recurrent shortness of breath.  She has undergone 2 additional thoracentesis (1.1 L on 07/02/2015 and 850 cc on 07/08/2015).  Cytology was negative x 2.  Bilateral lower extremity duplex on 07/03/2015 was negative.  Echo on 07/08/2015 revealed en EF of 55-60%.    She has severe rheumatoid arthritis.  Methotrexate and Enbrel were initially on hold.  She has  a normocytic anemia.  Work-up on 02/17/2015 revealed a normal ferritin, B12, folate, TSH.  She denies any melena or hematochezia.    She has anemia likely due to chronic disease. She received 1 unit PRBCs during her admission at [facility]. She denies any melena or hematochezia. She has diabetes and is on an insulin pump.  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 She is on Lovenox 60 mg twice a day.   She was admitted to [facility] from 07/29/2015 - 08/10/2015 with a right-sided empyema and liver abscess. She underwent CT-guided placement of a liver abscess drain on 07/30/2015. Liver abscess culture grew out group B strep and Enterobacter which was sensitive to Zosyn. She was transitioned to ertapenem Colbert Ewing) prior to discharge.  She was readmitted to [facility] on 09/17/2015.  Chest, abdomen, and pelvic CT scan on 09/01/2015 revealed continued decrease in perihepatic fluid collection contiguous with the right pleural space with percutaneous drain. Drain was removed on 09/01/2015.  Chest, abdomen, and pelvic CT scan on 09/11/2015 revealed a residual versus recurrent fluid collection posterior to the right hepatic lobe (2.6 x 1.1 x 4.0 cm).  Chest, abdomen, and pelvic CT scan on 10/13/2015 revealed resolution of the empyema.    She has had persistent neutropenia felt secondary to her rheumatoid arthritis.  Folate and MMA were normal.  TSH was 6.13 (high) with a free T4 of 1.17 (0.61-1.12).  She began methotrexate (10 mg a week) and prednisone (5 mg a day) for severe rheumatoid arthritis on 12/17/2015.  Counts are improving.  Bone marrow aspirate and biopsy on 06/09/2010 revealed a hypercellular marrow (70%) with no evidence of dysplasia or malignancy.  Flow cytometry was negative.  Cytogenetics were normal (46,XX).  FISH studies were negative for MDS.  Bone marrow  aspirate and biopsy on 12/03/2015 revealed a normocellular to mildy hypercellular marrow for age (40%) with left shifted myelopoiesis, non specific dyserythropoiesis and mild megakaryocytic atypia with no increase in blasts.  There were multiple small nonspecific lymphoid aggregates (favor reactive). There was no increase in reticulin.  There was decreased myeloid cells (37%) with left shifted maturation and 1% atypical myelod blasts.  There was relatively increased monocytic cells (11%), relatively increased lymphoid cells (36%), and relatively increased eosinophils (6%).  Cytogenetics were normal (59, XX).  SNP microarray was normal.  Alkaline phosphatase was 348 (38-126) on 11/06/2015 and 431 on 11/17/2015.   Fractionated alkaline phosphatase on 11/09/2015 revealed 21% bone and 79% liver.  MRI of the abdomen on 12/14/2015 revealed mild extrahepatic biliary duct dilatation without obstructing lesion identified.  Chest, abdomen, and pelvic CT scan on 02/15/2016 revealed multiple soft tissue nodules throughout the pelvis, small bowel mesenteric and peritoneum and, concerning for peritoneal metastatic disease.  There was soft tissue irregularity within the left upper quadrant.  There was mild right hydronephrosis (etiology unclear).  There  was interval resolution of previously described right pleural based fluid and gas collection. There was a pleural based nodule within the right lower hemi-thorax concerning for pleural based metastasis.  There were small bilateral pleural effusions.  There was pulmonary nodularity, predominately within the left upper lobe (metastatic or infectious/inflammatory etiology).  There was slightly increased mediastinal adenopathy (infectious/inflammatory or metastatic).  There was a low attenuation lesion within the left hepatic lobe (complicated fluid within the fissure or metastatic disease).  She was on tamoxifen from 02/25/2016 - 03/14/2016.  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  Abdomen and pelvic CT scan on 03/08/2016 revealed mild progression of peritoneal disease in the abdomen.  There was interval increase in loculated fluid around the lateral segment left liver, stomach, and spleen.  There was persistent soft tissue lesion at the level of the ampulla with mild intra and extrahepatic biliary duct dilatation.  There was persistent intrahepatic and capsular metastatic disease involving the liver.  She is s/p cycle #1 carboplatin and gemcitabine (03/21/2016) with Neulasta support.  ANC was good.  Nadir platelet count was 107,000 on day #11.  Symptomatically, she feels good.  Upper abdominal fullness has improved.  She has hypomagnesemia (1.6) despite oral magnesium.  Plan: 1.  Labs today:  CBC with diff, CMP, Mg, and CA125. 2.  Discuss insurance denial of Eliquis.  Continue to pursue.  Consider Xarelto.  Patient refuses Coumadin.  Until then, continue Lovenox BID. 3.  Discuss oral magnesium.  Unable to take more.  Discuss supplement with IV magnesium. 4.  Day 1 of cycle #2 carboplatin and gemcitabine. 5.  Magnesium 1 gm IV today. 6.  RTC on 04/18/2016 for MD assessment, labs (CBC with diff, CMP, Mg), day 8  gemcitabine of cycle #2. 7.  RTC on 04/19/2016 for Neulasta.     Lequita Asal, MD  04/11/2016, 9:57 AM

## 2016-04-09 NOTE — Progress Notes (Signed)
LUXE, FOMBY ([id number]) Visit Report for 04/08/2016 Chief Complaint Document Details Patient Name: [name], [name] 04/08/2016 12:45 Date of Service: PM Medical Record [id number] Number: Patient Account Number: [id number] 1946-01-04 (70 y.o. Treating RN: Date of Birth/Sex: Female) Other Clinician: Primary Care Physician: Caryl Bis, ERIC Treating Shavon Ashmore Referring Physician: Caryl Bis, ERIC Physician/Extender: Suella Grove in Treatment: 14 Information Obtained from: Patient Chief Complaint Mrs. Storts presents for evaluation of her left posterior heel pressure ulcer Electronic Signature(s) Signed: 04/08/2016 1:21:13 PM By: Christin Fudge MD, FACS Entered By: Christin Fudge on 04/08/2016 13:21:13 [name] ([id number]) -------------------------------------------------------------------------------- Debridement Details Patient Name: KEARSTIN, HINDES 04/08/2016 12:45 Date of Service: PM Medical Record [id number] Number: Patient Account Number: [id number] [birth year] (70 y.o. Treating RN: Date of Birth/Sex: Female) Other Clinician: Primary Care Physician: Caryl Bis, ERIC Treating Clydene Burack Referring Physician: Caryl Bis, ERIC Physician/Extender: Suella Grove in Treatment: 14 Debridement Performed for Wound #2 Left Calcaneus Assessment: Performed By: Physician Christin Fudge, MD Debridement: Debridement Pre-procedure Yes - 13:07 Verification/Time Out Taken: Start Time: 13:07 Pain Control: Lidocaine 4% Topical Solution Level: Skin/Subcutaneous Tissue Total Area Debrided (L x 0.5 (cm) x 0.5 (cm) = 0.25 (cm) W): Tissue and other Viable, Non-Viable, Exudate, Fibrin/Slough, Subcutaneous material debrided: Instrument: Curette Bleeding: Minimum Hemostasis Achieved: Pressure End Time: 13:11 Procedural Pain: 0 Post Procedural Pain: 0 Response to Treatment: Procedure was tolerated well Post Debridement Measurements of Total Wound Length: (cm) 0.5 Stage:  Category/Stage III Width: (cm) 0.5 Depth: (cm) 0.7 Volume: (cm) 0.137 Character of Wound/Ulcer Post Requires Further Debridement: Debridement Severity of Tissue Post Fat layer exposed Debridement: Post Procedure Diagnosis Same as Pre-procedure Electronic Signature(s) Signed: 04/08/2016 1:21:06 PM By: Christin Fudge MD, FACS RAJNI, STREETMAN ([id number]) Entered By: Christin Fudge on 04/08/2016 13:21:06 [name] ([id number]) -------------------------------------------------------------------------------- HPI Details Patient Name: RYA, BISAILLON 04/08/2016 12:45 Date of Service: PM Medical Record [id number] Number: Patient Account Number: [id number] 1945/12/06 (70 y.o. Treating RN: Date of Birth/Sex: Female) Other Clinician: Primary Care Physician: Caryl Bis, ERIC Treating Arcola Freshour Referring Physician: Caryl Bis, ERIC Physician/Extender: Weeks in Treatment: 14 History of Present Illness Location: right heel Quality: Patient reports experiencing a dull pain to affected area(s). Severity: Patient states wound are getting worse. Duration: Patient has had the wound for <5 months prior to presenting for treatment Timing: Pain in wound is Intermittent (comes and goes Context: The wound appeared gradually over time Modifying Factors: Consults to this date include: local care was done as per the medication given by the PCP which may have been medihoney Associated Signs and Symptoms: Patient reports having difficulty standing for long periods. 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 In the past she was seen for a decubitus ulcer on her right gluteal area. She is known to have diabetes mellitus type 1 and her most recent A1c was 7.2% Past medical history significant for  leukopenia, cholelithiasis, hypertension, chronic diastolic CHF, coronary artery disease,type 1 diabetes mellitus, rheumatoid arthritis, collagen vascular disease, ovarian cancer, status post CABG in 2014 and status post Port-A-Cath insertion by Dr. Leotis Pain in October 2016. She has quit smoking about 20 years ago. She was advised to use Medihoney with calcium alginate pads to be applied over the wound. 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 01/07/2016 -- x-ray of the left heel done -- IMPRESSION: No radiographic evidence of osteomyelitis. If this remains a clinical concern, recommend MR imaging. 02/05/16: returns today for f/u. denies fever, chills, body aches or malaise. no interval changes regarding health status. 02/12/16: pt is doing well. wound has improved. no s/s of infection. no systemic s/s of infection. She reports will be on vacation next week, and requests appointment in 2 weeks. 03/18/2016 - is going to be starting her chemotherapy this coming Monday and will have cycles every 3 weeks 04-01-16 Mrs. Steffensen, accompanied by her husband, presents for evaluation of her left posterior heel stage III pressure ulcer. She states that she started chemotherapy last week. Her chemotherapy is on a cycle of MARYETTE, WOLBERT. ([id number]) one week on 3 weeks off, with her next treatment scheduled for December 11. There has been an increase in drainage since her last appointment. She denies any pain. They have been using PolyMem silver rope. Electronic Signature(s) Signed: 04/08/2016 1:21:30 PM By: Christin Fudge MD, FACS Entered By: Christin Fudge on 04/08/2016 13:21:30 JERNI, DARWIN  ([id number]) -------------------------------------------------------------------------------- Physical Exam Details Patient Name: TONGELA, OUM 04/08/2016 12:45 Date of Service: PM Medical Record [id number] Number: Patient Account Number: [id number] 02-10-1946 (70 y.o. Treating RN: Date of Birth/Sex: Female) Other Clinician: Primary Care Physician: Caryl Bis, ERIC Treating Faylynn Stamos Referring Physician: Caryl Bis, ERIC Physician/Extender: Weeks in Treatment: 14 Constitutional . Pulse regular. Respirations normal and unlabored. Afebrile. . Eyes Nonicteric. Reactive to light. Ears, Nose, Mouth, and Throat Lips, teeth, and gums WNL.Marland Kitchen Moist mucosa without lesions. Neck supple and nontender. No palpable supraclavicular or cervical adenopathy. Normal sized without goiter. Respiratory WNL. No retractions.. Cardiovascular Pedal Pulses WNL. No clubbing, cyanosis or edema. Lymphatic No adneopathy. No adenopathy. No adenopathy. Musculoskeletal Adexa without tenderness or enlargement.. Digits and nails w/o clubbing, cyanosis, infection, petechiae, ischemia, or inflammatory conditions.. Integumentary (Hair, Skin) No suspicious lesions. No crepitus or fluctuance. No peri-wound warmth or erythema. No masses.Marland Kitchen Psychiatric Judgement and insight Intact.. No evidence of depression, anxiety, or agitation.. Notes the wound was macerated and had some surrounding debris and also debris in the depths of the wound and using a #3 curet to sharply removed all of this and bleeding was controlled with pressure. Electronic Signature(s) Signed: 04/08/2016 1:22:02 PM By: Christin Fudge MD, FACS Entered By: Christin Fudge on 04/08/2016 13:22:01 [name] ([id number]) -------------------------------------------------------------------------------- Physician Orders Details Patient Name: ALICAI, RENTIE 04/08/2016 12:45 Date of Service: PM Medical Record [id number] Number: Patient Account  Number: [id number] 1946/03/02 (70 y.o. Treating RN: Afful, RN, BSN, Velva Harman Date of Birth/Sex: Female) Other Clinician: Primary Care Physician: Caryl Bis, ERIC Treating Beverly Ferner Referring Physician: Caryl Bis, ERIC Physician/Extender: Suella Grove in Treatment: 29 Verbal / Phone Orders: Yes Clinician: Afful, RN, BSN, Rita Read Back and Verified: Yes Diagnosis Coding Wound Cleansing Wound #2 Left Calcaneus o Cleanse wound with mild soap and water o May Shower, gently pat wound dry prior to applying new dressing. Anesthetic Wound #2 Left Calcaneus o Topical Lidocaine 4% cream applied to wound bed prior to debridement Skin Barriers/Peri-Wound Care Wound #2 Left Calcaneus o Skin Prep Primary Wound Dressing Wound #2 Left Calcaneus o Cutimed Sorbact Hydroactive B o Other: - polymem ag use plain packing in clinic till you get home Secondary Dressing Wound #2  Left Calcaneus o Boardered Foam Dressing - or Coverllette Bandaid Dressing Change Frequency Wound #2 Left Calcaneus o Change dressing every other day. Follow-up Appointments Wound #2 Left Calcaneus o Return Appointment in 1 week. Off-Loading Wound #2 Left Calcaneus CORKY, TANENBAUM. ([id number]) o Heel suspension boot to: - ELevate on pillows Additional Orders / Instructions Wound #2 Left Calcaneus o Increase protein intake. o Activity as tolerated o Other: - INclude Vitamin A, C, ZInc, MVI in diet Electronic Signature(s) Signed: 04/08/2016 3:45:07 PM By: Christin Fudge MD, FACS Signed: 04/08/2016 3:48:43 PM By: Regan Lemming BSN, RN Entered By: Regan Lemming on 04/08/2016 13:17:33 WHITTLEY, JURKIEWICZ ([id number]) -------------------------------------------------------------------------------- Problem List Details Patient Name: JAME, VANLUVEN 04/08/2016 12:45 Date of Service: PM Medical Record [id number] Number: Patient Account Number: [id number] 07/15/1945 (70 y.o. Treating RN: Date of  Birth/Sex: Female) Other Clinician: Primary Care Physician: Caryl Bis, ERIC Treating Christin Fudge Referring Physician: Caryl Bis, ERIC Physician/Extender: Weeks in Treatment: 14 Active Problems ICD-10 Encounter Code Description Active Date Diagnosis E10.621 Type 1 diabetes mellitus with foot ulcer 12/31/2015 Yes L89.623 Pressure ulcer of left heel, stage 3 12/31/2015 Yes Z92.21 Personal history of antineoplastic chemotherapy 12/31/2015 Yes Z79.52 Long term (current) use of systemic steroids 12/31/2015 Yes Inactive Problems Resolved Problems Electronic Signature(s) Signed: 04/08/2016 1:20:55 PM By: Christin Fudge MD, FACS Entered By: Christin Fudge on 04/08/2016 13:20:55 [name] ([id number]) -------------------------------------------------------------------------------- Progress Note Details Patient Name: AYANA, DOWNS 04/08/2016 12:45 Date of Service: PM Medical Record [id number] Number: Patient Account Number: [id number] [birth year] (70 y.o. Treating RN: Date of Birth/Sex: Female) Other Clinician: Primary Care Physician: Caryl Bis, ERIC Treating Joedy Eickhoff Referring Physician: Caryl Bis, ERIC Physician/Extender: Suella Grove in Treatment: 14 Subjective Chief Complaint Information obtained from Patient Mrs. Ozan presents for evaluation of her left posterior heel pressure ulcer History of Present Illness (HPI) The following HPI elements were documented for the patient's wound: Location: right heel Quality: Patient reports experiencing a dull pain to affected area(s). Severity: Patient states wound are getting worse. Duration: Patient has had the wound for Timing: Pain in wound is Intermittent (comes and goes Context: The wound appeared gradually over time Modifying Factors: Consults to this date include: local care was done as per the medication given by the PCP which may have been medihoney Associated Signs and Symptoms: Patient reports having difficulty  standing for long periods. 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 In the past she was seen for a decubitus ulcer on her right gluteal area. She is known to have diabetes mellitus type 1 and her most recent A1c was 7.2% Past medical history significant for leukopenia, cholelithiasis, hypertension, chronic diastolic CHF, coronary artery disease,type 1 diabetes mellitus, rheumatoid arthritis, collagen vascular disease, ovarian cancer, status post CABG in 2014 and status post Port-A-Cath insertion by Dr. Leotis Pain in October 2016. She has quit smoking about 20 years ago. She was advised to use Medihoney with calcium alginate pads to be applied over the wound. 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 01/07/2016 -- x-ray of the left heel done --  IMPRESSION: No radiographic evidence of osteomyelitis. If this remains a clinical concern, recommend MR imaging. 02/05/16: returns today for f/u. denies fever, chills, body aches or malaise. no interval changes regarding health status. 02/12/16: pt is doing well. wound has improved. no s/s of infection. no systemic s/s of infection. She reports DONSHAE, MONICO. ([id number]) will be on vacation next week, and requests appointment in 2 weeks. 03/18/2016 - is going to be starting her chemotherapy this coming Monday and will have cycles every 3 weeks 04-01-16 Mrs. Folkes, accompanied by her husband, presents for evaluation of her left posterior heel stage III pressure ulcer. She  states that she started chemotherapy last week. Her chemotherapy is on a cycle of one week on 3 weeks off, with her next treatment scheduled for December 11. There has been an increase in drainage since her last appointment. She denies any pain. They have been using PolyMem silver rope. Objective Constitutional Pulse regular. Respirations normal and unlabored. Afebrile. Vitals Time Taken: 12:55 PM, Height: 63 in, Weight: 130 lbs, BMI: 23, Temperature: 98.3 F, Pulse: 81 bpm, Respiratory Rate: 16 breaths/min, Blood Pressure: 134/66 mmHg. Eyes Nonicteric. Reactive to light. Ears, Nose, Mouth, and Throat Lips, teeth, and gums WNL.Marland Kitchen Moist mucosa without lesions. Neck supple and nontender. No palpable supraclavicular or cervical adenopathy. Normal sized without goiter. Respiratory WNL. No retractions.. Cardiovascular Pedal Pulses WNL. No clubbing, cyanosis or edema. Lymphatic No adneopathy. No adenopathy. No adenopathy. Musculoskeletal Adexa without tenderness or enlargement.. Digits and nails w/o clubbing, cyanosis, infection, petechiae, ischemia, or inflammatory conditions.Marland Kitchen Psychiatric Judgement and insight Intact.. No evidence of depression, anxiety, or agitation.. General Notes: the wound was macerated and had some surrounding debris and also debris in the depths ROZENA, ABRAMOWICZ. ([id number]) of the wound and using a #3 curet to sharply removed all of this and bleeding was controlled with pressure. Integumentary (Hair, Skin) No suspicious lesions. No crepitus or fluctuance. No peri-wound warmth or erythema. No masses.. Wound #2 status is Open. Original cause of wound was Pressure Injury. The wound is located on the Left Calcaneus. The wound measures 0.5cm length x 0.5cm width x 0.7cm depth; 0.196cm^2 area and 0.137cm^3 volume. The wound is limited to skin breakdown. There is no tunneling or undermining noted. There is a medium amount of serous drainage noted. The wound margin is  flat and intact. There is no granulation within the wound bed. There is a medium (34-66%) amount of necrotic tissue within the wound bed including Adherent Slough. The periwound skin appearance exhibited: Induration, Maceration, Moist. The periwound skin appearance did not exhibit: Callus, Crepitus, Excoriation, Fluctuance, Friable, Localized Edema, Rash, Scarring, Dry/Scaly, Atrophie Blanche, Cyanosis, Ecchymosis, Hemosiderin Staining, Mottled, Pallor, Rubor, Erythema. Periwound temperature was noted as No Abnormality. The periwound has tenderness on palpation. Assessment Active Problems ICD-10 E10.621 - Type 1 diabetes mellitus with foot ulcer L89.623 - Pressure ulcer of left heel, stage 3 Z92.21 - Personal history of antineoplastic chemotherapy Z79.52 - Long term (current) use of systemic steroids Procedures Wound #2 Wound #2 is a Pressure Ulcer located on the Left Calcaneus . There was a Skin/Subcutaneous Tissue Debridement [id number]) debridement with total area of 0.25 sq cm performed by Christin Fudge, MD. with the following instrument(s): Curette to remove Viable and Non-Viable tissue/material including Exudate, Fibrin/Slough, and Subcutaneous after achieving pain control using Lidocaine 4% Topical Solution. A time out was conducted at 13:07, prior to the start of the procedure. A Minimum amount of bleeding was controlled with Pressure. The procedure was tolerated well  with a pain level of 0 throughout and a pain level of 0 following the procedure. Post Debridement Measurements: 0.5cm length x 0.5cm width x 0.7cm depth; 0.137cm^3 volume. Post debridement Stage noted as Category/Stage III. Character of Wound/Ulcer Post Debridement requires further debridement. Severity of Tissue Post Debridement is: Fat layer exposed. Post procedure Diagnosis Wound #2: Same as Pre-Procedure ABBYGAIL, RIDER ([id number]) Plan Wound Cleansing: Wound #2 Left Calcaneus: Cleanse wound with mild soap  and water May Shower, gently pat wound dry prior to applying new dressing. Anesthetic: Wound #2 Left Calcaneus: Topical Lidocaine 4% cream applied to wound bed prior to debridement Skin Barriers/Peri-Wound Care: Wound #2 Left Calcaneus: Skin Prep Primary Wound Dressing: Wound #2 Left Calcaneus: Cutimed Sorbact Hydroactive B Other: - polymem ag use plain packing in clinic till you get home Secondary Dressing: Wound #2 Left Calcaneus: Boardered Foam Dressing - or Coverllette Bandaid Dressing Change Frequency: Wound #2 Left Calcaneus: Change dressing every other day. Follow-up Appointments: Wound #2 Left Calcaneus: Return Appointment in 1 week. Off-Loading: Wound #2 Left Calcaneus: Heel suspension boot to: - ELevate on pillows Additional Orders / Instructions: Wound #2 Left Calcaneus: Increase protein intake. Activity as tolerated Other: - INclude Vitamin A, C, ZInc, MVI in diet I have recommended going back to packing the wound with PolyMem silver rope, and we will change this every other day. She will be seen back in a weeks' time. Electronic Signature(s) CARICIA, GILFORD ([id number]) Signed: 04/08/2016 1:23:10 PM By: Christin Fudge MD, FACS Entered By: Christin Fudge on 04/08/2016 13:23:10 XENA, OMAN ([id number]) -------------------------------------------------------------------------------- SuperBill Details Patient Name: [name] Date of Service: 04/08/2016 Medical Record Number: [id number] Patient Account Number: [id number] Date of Birth/Sex: [birth year] (70 y.o. Female) Treating RN: Primary Care Physician: Caryl Bis, ERIC Other Clinician: Referring Physician: Caryl Bis, ERIC Treating Physician/Extender: Frann Rider in Treatment: 14 Diagnosis Coding ICD-10 Codes Code Description E10.621 Type 1 diabetes mellitus with foot ulcer L89.623 Pressure ulcer of left heel, stage 3 Z92.21 Personal history of antineoplastic chemotherapy Z79.52 Long term  (current) use of systemic steroids Facility Procedures CPT4 Code: [id number] Description: [id number] - DEB SUBQ TISSUE 20 SQ CM/< ICD-10 Description Diagnosis E10.621 Type 1 diabetes mellitus with foot ulcer L89.623 Pressure ulcer of left heel, stage 3 Z92.21 Personal history of antineoplastic chemothera Modifier: py Quantity: 1 Physician Procedures CPT4 Code: [id number] Description: [id number] - WC PHYS SUBQ TISS 20 SQ CM ICD-10 Description Diagnosis E10.621 Type 1 diabetes mellitus with foot ulcer L89.623 Pressure ulcer of left heel, stage 3 Z92.21 Personal history of antineoplastic chemothera Modifier: py Quantity: 1 Electronic Signature(s) Signed: 04/08/2016 1:23:27 PM By: Christin Fudge MD, FACS Entered By: Christin Fudge on 04/08/2016 13:23:27

## 2016-04-11 ENCOUNTER — Encounter: Payer: Self-pay | Admitting: Hematology and Oncology

## 2016-04-11 ENCOUNTER — Inpatient Hospital Stay: Payer: Medicare Other

## 2016-04-11 ENCOUNTER — Inpatient Hospital Stay (HOSPITAL_BASED_OUTPATIENT_CLINIC_OR_DEPARTMENT_OTHER): Payer: Medicare Other | Admitting: Hematology and Oncology

## 2016-04-11 VITALS — BP 115/67 | HR 83 | Temp 97.1°F | Resp 18 | Wt 140.7 lb

## 2016-04-11 DIAGNOSIS — Z7901 Long term (current) use of anticoagulants: Secondary | ICD-10-CM

## 2016-04-11 DIAGNOSIS — C786 Secondary malignant neoplasm of retroperitoneum and peritoneum: Secondary | ICD-10-CM

## 2016-04-11 DIAGNOSIS — K8020 Calculus of gallbladder without cholecystitis without obstruction: Secondary | ICD-10-CM

## 2016-04-11 DIAGNOSIS — I10 Essential (primary) hypertension: Secondary | ICD-10-CM

## 2016-04-11 DIAGNOSIS — Z794 Long term (current) use of insulin: Secondary | ICD-10-CM

## 2016-04-11 DIAGNOSIS — G629 Polyneuropathy, unspecified: Secondary | ICD-10-CM

## 2016-04-11 DIAGNOSIS — Z808 Family history of malignant neoplasm of other organs or systems: Secondary | ICD-10-CM

## 2016-04-11 DIAGNOSIS — C787 Secondary malignant neoplasm of liver and intrahepatic bile duct: Secondary | ICD-10-CM | POA: Diagnosis not present

## 2016-04-11 DIAGNOSIS — N1330 Unspecified hydronephrosis: Secondary | ICD-10-CM

## 2016-04-11 DIAGNOSIS — Z9641 Presence of insulin pump (external) (internal): Secondary | ICD-10-CM

## 2016-04-11 DIAGNOSIS — E108 Type 1 diabetes mellitus with unspecified complications: Secondary | ICD-10-CM

## 2016-04-11 DIAGNOSIS — K219 Gastro-esophageal reflux disease without esophagitis: Secondary | ICD-10-CM

## 2016-04-11 DIAGNOSIS — Z8701 Personal history of pneumonia (recurrent): Secondary | ICD-10-CM

## 2016-04-11 DIAGNOSIS — C569 Malignant neoplasm of unspecified ovary: Secondary | ICD-10-CM | POA: Diagnosis not present

## 2016-04-11 DIAGNOSIS — I5032 Chronic diastolic (congestive) heart failure: Secondary | ICD-10-CM

## 2016-04-11 DIAGNOSIS — I999 Unspecified disorder of circulatory system: Secondary | ICD-10-CM

## 2016-04-11 DIAGNOSIS — Z9049 Acquired absence of other specified parts of digestive tract: Secondary | ICD-10-CM

## 2016-04-11 DIAGNOSIS — I82C11 Acute embolism and thrombosis of right internal jugular vein: Secondary | ICD-10-CM

## 2016-04-11 DIAGNOSIS — Z86718 Personal history of other venous thrombosis and embolism: Secondary | ICD-10-CM

## 2016-04-11 DIAGNOSIS — Z79899 Other long term (current) drug therapy: Secondary | ICD-10-CM

## 2016-04-11 DIAGNOSIS — C801 Malignant (primary) neoplasm, unspecified: Secondary | ICD-10-CM

## 2016-04-11 DIAGNOSIS — M069 Rheumatoid arthritis, unspecified: Secondary | ICD-10-CM

## 2016-04-11 DIAGNOSIS — Z87891 Personal history of nicotine dependence: Secondary | ICD-10-CM

## 2016-04-11 DIAGNOSIS — K769 Liver disease, unspecified: Secondary | ICD-10-CM

## 2016-04-11 DIAGNOSIS — D72819 Decreased white blood cell count, unspecified: Secondary | ICD-10-CM

## 2016-04-11 DIAGNOSIS — I252 Old myocardial infarction: Secondary | ICD-10-CM

## 2016-04-11 DIAGNOSIS — R188 Other ascites: Secondary | ICD-10-CM

## 2016-04-11 DIAGNOSIS — E876 Hypokalemia: Secondary | ICD-10-CM

## 2016-04-11 DIAGNOSIS — Z5111 Encounter for antineoplastic chemotherapy: Secondary | ICD-10-CM

## 2016-04-11 DIAGNOSIS — E785 Hyperlipidemia, unspecified: Secondary | ICD-10-CM

## 2016-04-11 DIAGNOSIS — I2510 Atherosclerotic heart disease of native coronary artery without angina pectoris: Secondary | ICD-10-CM

## 2016-04-11 DIAGNOSIS — Z8719 Personal history of other diseases of the digestive system: Secondary | ICD-10-CM

## 2016-04-11 LAB — COMPREHENSIVE METABOLIC PANEL
ALT: 10 U/L — ABNORMAL LOW (ref 14–54)
AST: 16 U/L (ref 15–41)
Albumin: 3.1 g/dL — ABNORMAL LOW (ref 3.5–5.0)
Alkaline Phosphatase: 58 U/L (ref 38–126)
Anion gap: 4 — ABNORMAL LOW (ref 5–15)
BUN: 21 mg/dL — ABNORMAL HIGH (ref 6–20)
CO2: 26 mmol/L (ref 22–32)
Calcium: 8.7 mg/dL — ABNORMAL LOW (ref 8.9–10.3)
Chloride: 102 mmol/L (ref 101–111)
Creatinine, Ser: 0.87 mg/dL (ref 0.44–1.00)
GFR calc Af Amer: >60 mL/min (ref >60)
GFR calc non Af Amer: >60 mL/min (ref >60)
Glucose, Bld: 209 mg/dL — ABNORMAL HIGH (ref 65–99)
Potassium: 4.7 mmol/L (ref 3.5–5.1)
Sodium: 132 mmol/L — ABNORMAL LOW (ref 135–145)
Total Bilirubin: 0.4 mg/dL (ref 0.3–1.2)
Total Protein: 6.7 g/dL (ref 6.5–8.1)

## 2016-04-11 LAB — CBC WITH DIFFERENTIAL/PLATELET
Basophils Absolute: 0 10*3/uL (ref 0–0.1)
Basophils Relative: 1 %
Eosinophils Absolute: 0 10*3/uL (ref 0–0.7)
Eosinophils Relative: 1 %
HCT: 27.8 % — ABNORMAL LOW (ref 35.0–47.0)
Hemoglobin: 9.7 g/dL — ABNORMAL LOW (ref 12.0–16.0)
Lymphocytes Relative: 15 %
Lymphs Abs: 0.6 10*3/uL — ABNORMAL LOW (ref 1.0–3.6)
MCH: 30.5 pg (ref 26.0–34.0)
MCHC: 34.8 g/dL (ref 32.0–36.0)
MCV: 87.8 fL (ref 80.0–100.0)
Monocytes Absolute: 0.6 10*3/uL (ref 0.2–0.9)
Monocytes Relative: 16 %
Neutro Abs: 2.7 10*3/uL (ref 1.4–6.5)
Neutrophils Relative %: 67 %
Platelets: 283 10*3/uL (ref 150–440)
RBC: 3.16 MIL/uL — ABNORMAL LOW (ref 3.80–5.20)
RDW: 17.1 % — ABNORMAL HIGH (ref 11.5–14.5)
WBC: 4 10*3/uL (ref 3.6–11.0)

## 2016-04-11 LAB — MAGNESIUM: Magnesium: 1.6 mg/dL — ABNORMAL LOW (ref 1.7–2.4)

## 2016-04-11 MED ORDER — SODIUM CHLORIDE 0.9 % IV SOLN: 1600.0000 mg | Freq: Once | INTRAVENOUS | Status: DC

## 2016-04-11 MED ORDER — SODIUM CHLORIDE 0.9 % IV SOLN
1600.0000 mg | Freq: Once | INTRAVENOUS | Status: AC
  Administered 2016-04-11: 1600 mg via INTRAVENOUS
  Filled 2016-04-11: qty 26.3

## 2016-04-11 MED ORDER — DEXAMETHASONE SODIUM PHOSPHATE 10 MG/ML IJ SOLN
10.0000 mg | Freq: Once | INTRAMUSCULAR | Status: AC
  Administered 2016-04-11: 10 mg via INTRAVENOUS
  Filled 2016-04-11: qty 1

## 2016-04-11 MED ORDER — MAGNESIUM SULFATE 50 % IJ SOLN
1.0000 g | Freq: Once | INTRAMUSCULAR | Status: AC
  Administered 2016-04-11: 1 g via INTRAVENOUS
  Filled 2016-04-11: qty 2

## 2016-04-11 MED ORDER — SODIUM CHLORIDE 0.9 % IV SOLN
Freq: Once | INTRAVENOUS | Status: AC
  Administered 2016-04-11: 11:00:00 via INTRAVENOUS
  Filled 2016-04-11: qty 1000

## 2016-04-11 MED ORDER — PALONOSETRON HCL INJECTION 0.25 MG/5ML
0.2500 mg | Freq: Once | INTRAVENOUS | Status: AC
  Administered 2016-04-11: 0.25 mg via INTRAVENOUS
  Filled 2016-04-11: qty 5

## 2016-04-11 MED ORDER — SODIUM CHLORIDE 0.9% FLUSH
10.0000 mL | INTRAVENOUS | Status: DC | PRN
  Administered 2016-04-11: 10 mL via INTRAVENOUS
  Filled 2016-04-11: qty 10

## 2016-04-11 MED ORDER — SODIUM CHLORIDE 0.9 % IV SOLN
355.5000 mg | Freq: Once | INTRAVENOUS | Status: AC
  Administered 2016-04-11: 360 mg via INTRAVENOUS
  Filled 2016-04-11: qty 36

## 2016-04-11 MED ORDER — HEPARIN SOD (PORK) LOCK FLUSH 100 UNIT/ML IV SOLN
500.0000 [IU] | Freq: Once | INTRAVENOUS | Status: AC
  Administered 2016-04-11: 500 [IU] via INTRAVENOUS
  Filled 2016-04-11: qty 5

## 2016-04-11 NOTE — Progress Notes (Signed)
Patient offers no complaints today.  Wants to discuss with MD regarding Eliquis.

## 2016-04-12 ENCOUNTER — Telehealth: Payer: Self-pay | Admitting: *Deleted

## 2016-04-12 ENCOUNTER — Encounter: Payer: Self-pay | Admitting: Cardiovascular Disease

## 2016-04-12 ENCOUNTER — Ambulatory Visit (INDEPENDENT_AMBULATORY_CARE_PROVIDER_SITE_OTHER): Payer: Medicare Other | Admitting: Cardiovascular Disease

## 2016-04-12 VITALS — BP 120/50 | HR 79 | Ht 63.0 in | Wt 140.8 lb

## 2016-04-12 DIAGNOSIS — I2510 Atherosclerotic heart disease of native coronary artery without angina pectoris: Secondary | ICD-10-CM

## 2016-04-12 DIAGNOSIS — E782 Mixed hyperlipidemia: Secondary | ICD-10-CM

## 2016-04-12 DIAGNOSIS — I10 Essential (primary) hypertension: Secondary | ICD-10-CM | POA: Diagnosis not present

## 2016-04-12 DIAGNOSIS — I2583 Coronary atherosclerosis due to lipid rich plaque: Secondary | ICD-10-CM

## 2016-04-12 DIAGNOSIS — E1159 Type 2 diabetes mellitus with other circulatory complications: Secondary | ICD-10-CM

## 2016-04-12 DIAGNOSIS — E1059 Type 1 diabetes mellitus with other circulatory complications: Secondary | ICD-10-CM

## 2016-04-12 DIAGNOSIS — I209 Angina pectoris, unspecified: Secondary | ICD-10-CM

## 2016-04-12 DIAGNOSIS — C569 Malignant neoplasm of unspecified ovary: Secondary | ICD-10-CM

## 2016-04-12 DIAGNOSIS — Z951 Presence of aortocoronary bypass graft: Secondary | ICD-10-CM

## 2016-04-12 DIAGNOSIS — I82C11 Acute embolism and thrombosis of right internal jugular vein: Secondary | ICD-10-CM

## 2016-04-12 LAB — CA 125: CA 125: 152.8 U/mL — ABNORMAL HIGH (ref 0.0–38.1)

## 2016-04-12 MED ORDER — METOPROLOL TARTRATE 25 MG PO TABS: 25.0000 mg | ORAL_TABLET | Freq: Two times a day (BID) | ORAL | 3 refills | Status: DC

## 2016-04-12 NOTE — Patient Instructions (Signed)

## 2016-04-12 NOTE — Progress Notes (Signed)
Cardiology Office Note  Date:  04/12/2016   ID:  Janet, [name] November 18, 1945, MRN [id number]  PCP:  Tommi Rumps, MD   Chief Complaint  Patient presents with  . other    6 month f/u c/o low diastolic BP. Meds reviewed verbally with pt.    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 She had spiking fevers, chills, severe shortness of breath. Sugars were in the 400s. She had a long course of antibiotics to her hospital visit. Secondary to significant cough, she was unable to lie flat for cardiac catheterization. She declined cardiac catheterization on the same hospital visit. On admission to the hospital, her immunosuppressants, Enbrel and methotrexate were held She had cardiac catheterization in followup after her pneumonia resolved. This showed severe three-vessel CAD. She was transferred from [facility] to [place] for bypass surgery. She had four-vessel bypass surgery with LIMA to the LAD, saphenous vein graft to the diagonal, vein graft to the OM, vein graft to the PDA. She reports relatively uneventful surgery.  She presents today for follow-up of her coronary disease, history of bypass   diagnosed with clinical stage IIIC (T3cN1Mx) ovarian cancer presenting with abdominal discomfort and bloating. Omental biopsy on 02/23/2015 revealed metastatic high grade serous carcinoma, consistent with gynecologic origin. CA125 was 707 on 02/17/2015.  Abdomen and pelvic CT scan on 02/13/2015 revealed bilateral mass-like adnexal regions (right adnexal mass 5.6 x 5.0 cm and the left adnexa mass 10.3 x 6.0 x 9.9 cm).  Surgery at [facility], with surgical debulking, resection of  part of large  intestines, spot on liver (scaped it)  Developed Liver infection/abscess, put in drain, grew group B strep and enterobacter abx on ABX 8 days at [facility] IV infusion ABX , changed to pill,  Drain was removed on 09/01/2015.  Port on right, developed clot 3/17,  on lovenox BID Arm was swelling, Pleural effusion Lost weight, down 40 pounds since 10/16 Echo in 2/17, 3/17  In follow-up today she has had Recent chemo,  She had associated SOB, nausea, weak, lunesta given Repeat chemo yesterday,  Maybe two more rounds of chemotherapy Eating more, feeling stronger Weight is Up 14 pounds from last clinic visit  Continues to take lovenox BID, eliquis not covered by insurance? Also tried xarelto per the patient Perhaps will try in January 2018 Having trouble finding places for injection  Denies any chest pain concerning for angina Recent lab work discussed with her detail, total cholesterol down to 166, previously was 266 Not on a statin at this time  EKG on today's visit shows normal sinus rhythm  no significant ST or T-wave changes  Other past medical history she has seen Precious Reel for her rheumatoid arthritis, on Enbrel and methotrexate. She reports symptoms are stable  Echocardiogram 12/23/2012 showed normal ejection fraction with no wall motion abnormality, normal right ventricular size and function, moderately elevated right ventricular systolic pressures.  Cardiac catheterization showed 100% proximally occluded RCA, critical ostial LAD disease, severe mid LAD disease, severe mid left circumflex disease, ejection fraction estimated at 50-55% with mild hypokinesis of the inferior wall.  PMH:   has a past medical history of CAD (coronary artery disease); Cholelithiasis; Chronic  diastolic CHF (congestive heart failure) ([place]); Collagen vascular disease ([place]); Esophageal stricture; Exertional shortness of breath; GERD (gastroesophageal reflux disease); H/O hiatal  hernia; Herniated disc; History of pancytopenia; Hyperlipidemia; Hypertension; Hypokalemia; Leukopenia (2012); NSTEMI (non-ST elevated myocardial infarction) ([place]) (08/2012); Ovarian cancer ([place]) (2016); Pneumonia (2013; 08/2012); Rheumatoid arthritis(714.0); and Type I diabetes mellitus ([place]).  PSH:    Past Surgical History:  Procedure Laterality Date  . ABDOMINAL HYSTERECTOMY  06/15/2015   Dx L/S, EXLAP TAH BSO omentectomy RSRx colostomy diaphragm resection stripping  . CARDIAC CATHETERIZATION  10/18/2012   "first one was today" (10/18/2012)  . CATARACT EXTRACTION W/ INTRAOCULAR LENS IMPLANT Right 2010  . CHOLECYSTECTOMY  06/15/2015   combined case with ovarian cancer debulking  . COLON SURGERY    . CORONARY ARTERY BYPASS GRAFT N/A 10/19/2012   Procedure: CORONARY ARTERY BYPASS GRAFTING (CABG);  Surgeon: Melrose Nakayama, MD;  Location: [place];  Service: Open Heart Surgery;  Laterality: N/A;  . ESOPHAGEAL DILATION     "3 or 4 times" (10/19/2011)  . ESOPHAGOGASTRODUODENOSCOPY  2012   Dr. Minna Merritts  . OSTOMY    . OVARY SURGERY     removal  . PERIPHERAL VASCULAR CATHETERIZATION N/A 03/02/2015   Procedure: Glori Luis Cath Insertion;  Surgeon: Algernon Huxley, MD;  Location: [place] [facility];  Service: Cardiovascular;  Laterality: N/A;  . TUBAL LIGATION  1970    Current Outpatient Prescriptions  Medication Sig Dispense Refill  . Calcium-Vitamin D 600-200 MG-UNIT tablet Take 2 tablets by mouth daily.     Marland Kitchen enoxaparin (LOVENOX) 60 MG/0.6ML injection Inject 0.6 mLs (60 mg total) into the skin every 12 (twelve) hours. 10 Syringe 0  . folic acid (FOLVITE) 1 MG tablet TAKE 1 TABLET BY MOUTH ONCE A DAY 90 tablet 3  . furosemide (LASIX) 80 MG tablet Take 80 mg by mouth daily as needed.     . Insulin Human (INSULIN PUMP) SOLN Pt uses Humalog insulin.    Marland Kitchen insulin lispro (HUMALOG) 100 UNIT/ML injection Inject 0.06 mLs (6 Units total) into the skin daily. 30 mL 3  . lidocaine-prilocaine (EMLA) cream Apply 1  application topically as needed (prior to accessing port).    . loratadine (CLARITIN) 10 MG tablet Take 1 tablet by mouth daily as needed.    Marland Kitchen losartan (COZAAR) 100 MG tablet Take 1 tablet (100 mg total) by mouth daily. 90 tablet 1  . magnesium oxide (MAG-OX) 400 (241.3 Mg) MG tablet Take 1 tablet (400 mg total) by mouth daily. 30 tablet 2  . metoprolol tartrate (LOPRESSOR) 25 MG tablet Take 1 tablet (25 mg total) by mouth 2 (two) times daily. 180 tablet 3  . ondansetron (ZOFRAN) 8 MG tablet TAKE 1 TABLET BY MOUTH EVERY 8 HOURS AS NEEDED FOR NAUSEA OR VOMITING 30 tablet 1  . ONE TOUCH ULTRA TEST test strip     . oxyCODONE (OXY IR/ROXICODONE) 5 MG immediate release tablet Take 5 mg by mouth every 6 (six) hours as needed. Reported on 11/17/2015    . pantoprazole (PROTONIX) 40 MG tablet TAKE 1 TABLET BY MOUTH TWICE A DAY 180 tablet 1  . potassium chloride SA (K-DUR,KLOR-CON) 20 MEQ tablet TAKE 2 TABLETS BY MOUTH ONCE A DAY 60 tablet 1  . ranitidine (ZANTAC) 150 MG tablet Take 1 tablet (150 mg total) by mouth 2 (two) times daily. (Patient taking differently: Take 150 mg by mouth 2 (two) times daily as needed. ) 180 tablet 2  . vitamin C (ASCORBIC ACID) 500 MG  tablet Take 500 mg by mouth daily.    Marland Kitchen zinc gluconate 50 MG tablet Take 50 mg by mouth daily.     No current facility-administered medications for this visit.    Facility-Administered Medications Ordered in Other Visits  Medication Dose Route Frequency Provider Last Rate Last Dose  . sodium chloride 0.9 % injection 10 mL  10 mL Intracatheter PRN Lequita Asal, MD      . sodium chloride 0.9 % injection 10 mL  10 mL Intravenous PRN Lequita Asal, MD   10 mL at 04/13/15 [id number]     Allergies:   Codeine and Latex   Social History:  The patient  reports that she quit smoking about 21 years ago. Her smoking use included Cigarettes. She has a 30.00 pack-year smoking history. She has never used smokeless tobacco. She reports that she does  not drink alcohol or use drugs.   Family History:   family history includes Arthritis in her father and mother; Bone cancer in her sister; Diabetes in her father and mother.    Review of Systems: Review of Systems  Constitutional: Negative.   Respiratory: Negative.   Cardiovascular: Negative.   Gastrointestinal: Negative.   Musculoskeletal: Negative.   Neurological: Negative.   Psychiatric/Behavioral: Negative.   All other systems reviewed and are negative.    PHYSICAL EXAM: VS:  BP (!) 120/50 (BP Location: Left Arm, Patient Position: Sitting, Cuff Size: Normal)   Pulse 79   Ht 5\' 3"  (1.6 m)   Wt 140 lb 12 oz (63.8 kg)   BMI 24.93 kg/m  , BMI Body mass index is 24.93 kg/m. GEN: Well nourished, well developed, in no acute distress  HEENT: normal  Neck: no JVD, carotid bruits, or masses Cardiac: RRR; no murmurs, rubs, or gallops,trace b/l leg  edema  Respiratory:  clear to auscultation bilaterally, normal work of breathing GI: soft, nontender, nondistended, + BS MS: no deformity or atrophy  Skin: warm and dry, no rash Neuro:  Strength and sensation are intact Psych: euthymic mood, full affect    Recent Labs: 07/01/2015: B Natriuretic Peptide 237.0 11/06/2015: TSH 6.130 04/11/2016: ALT 10; BUN 21; Creatinine, Ser 0.87; Hemoglobin 9.7; Magnesium 1.6; Platelets 283; Potassium 4.7; Sodium 132    Lipid Panel Lab Results  Component Value Date   CHOL 166 04/08/2016   HDL 48 04/08/2016   LDLCALC 100 (H) 04/08/2016   TRIG 90 04/08/2016      Wt Readings from Last 3 Encounters:  04/12/16 140 lb 12 oz (63.8 kg)  04/11/16 140 lb 10.5 oz (63.8 kg)  03/31/16 139 lb 12.4 oz (63.4 kg)       ASSESSMENT AND PLAN:  Essential hypertension - Plan: EKG 12-Lead Blood pressure is well controlled on today's visit. No changes made to the medications.  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 No further workup at this time. Continue current medication regimen.   Total encounter time more than 25 minutes  Greater than 50% was spent in counseling and coordination of care with the patient   Disposition:   F/U  6 months   Orders Placed This Encounter  Procedures  . EKG 12-Lead     Signed, Esmond Plants, M.D., Ph.D. 04/12/2016  [place], [place]

## 2016-04-12 NOTE — Telephone Encounter (Signed)
Called patient and LVM that her tumor marker is decreasing.

## 2016-04-12 NOTE — Telephone Encounter (Signed)
-----   Message from Lequita Asal, MD sent at 04/12/2016  7:58 AM EST ----- Regarding: Please call patient  CA125 is decreasing!  M ----- Message ----- From: Interface, Lab In [place] Sent: 04/11/2016   9:19 AM To: Lequita Asal, MD

## 2016-04-13 ENCOUNTER — Other Ambulatory Visit: Payer: Self-pay

## 2016-04-13 DIAGNOSIS — K219 Gastro-esophageal reflux disease without esophagitis: Secondary | ICD-10-CM

## 2016-04-13 DIAGNOSIS — Z8719 Personal history of other diseases of the digestive system: Secondary | ICD-10-CM

## 2016-04-13 DIAGNOSIS — R1314 Dysphagia, pharyngoesophageal phase: Secondary | ICD-10-CM

## 2016-04-13 MED ORDER — RANITIDINE HCL 150 MG PO TABS: 150.0000 mg | ORAL_TABLET | Freq: Two times a day (BID) | ORAL | 1 refills | Status: DC

## 2016-04-13 NOTE — Progress Notes (Signed)
Sent to pharmacy 

## 2016-04-13 NOTE — Progress Notes (Signed)
Last filled by Dr.Walker 11/13/14

## 2016-04-15 ENCOUNTER — Other Ambulatory Visit: Payer: Self-pay | Admitting: *Deleted

## 2016-04-15 ENCOUNTER — Encounter: Payer: Medicare Other | Admitting: Surgery

## 2016-04-15 ENCOUNTER — Telehealth: Payer: Self-pay | Admitting: *Deleted

## 2016-04-15 DIAGNOSIS — C786 Secondary malignant neoplasm of retroperitoneum and peritoneum: Secondary | ICD-10-CM

## 2016-04-15 DIAGNOSIS — E10621 Type 1 diabetes mellitus with foot ulcer: Secondary | ICD-10-CM | POA: Diagnosis not present

## 2016-04-15 DIAGNOSIS — C801 Malignant (primary) neoplasm, unspecified: Secondary | ICD-10-CM

## 2016-04-15 DIAGNOSIS — C569 Malignant neoplasm of unspecified ovary: Secondary | ICD-10-CM

## 2016-04-15 NOTE — Telephone Encounter (Signed)
Called to report that she only has 2 more injections left and needs to has more called in. Please advise if this is to be refilled.

## 2016-04-15 NOTE — Telephone Encounter (Signed)
Got a call from [facility] that the eliquis appeal has been approved and she can get the eliquis.  I called pt's pharmacy and they ran the rx and it will cost 17.00. I then called pt to let her know it has been approved.  I called pt and let her know that it is approved and she can pick it up at pharmacy. She was not at home so I left this message on her machine.

## 2016-04-15 NOTE — Telephone Encounter (Signed)
Per Burna Sis, RN Eliquis got approved yesterday evening and is at pharmacy ready to pick up. Copay ~ $18. She was going to notify patient of this

## 2016-04-16 NOTE — Progress Notes (Addendum)
LAVREN, CHIAPPONE ([id number]) Visit Report for 04/15/2016 Arrival Information Details Patient Name: MELISS, HANAHAN Date of Service: 04/15/2016 11:00 AM Medical Record Number: [id number] Patient Account Number: [id number] Date of Birth/Sex: 02-02-1946 (70 y.o. Female) Treating RN: Afful, RN, BSN, Velva Harman Primary Care Physician: Caryl Bis, ERIC Other Clinician: Referring Physician: Caryl Bis, ERIC Treating Physician/Extender: Frann Rider in Treatment: 15 Visit Information History Since Last Visit All ordered tests and consults were completed: No Patient Arrived: Cane Added or deleted any medications: No Arrival Time: 10:55 Any new allergies or adverse reactions: No Accompanied By: hubby Had a fall or experienced change in No Transfer Assistance: None activities of daily living that may affect Patient Identification Verified: Yes risk of falls: Secondary Verification Process Yes Signs or symptoms of abuse/neglect since last No Completed: visito Patient Requires Transmission- No Hospitalized since last visit: No Based Precautions: Has Dressing in Place as Prescribed: Yes Patient Has Alerts: Yes Pain Present Now: No Patient Alerts: Patient on Blood Thinner lovenox BID DMII Electronic Signature(s) Signed: 04/15/2016 10:56:15 AM By: Regan Lemming BSN, RN Entered By: Regan Lemming on 04/15/2016 10:56:14 [name] ([id number]) -------------------------------------------------------------------------------- Clinic Level of Care Assessment Details Patient Name: [name] Date of Service: 04/15/2016 11:00 AM Medical Record Number: [id number] Patient Account Number: [id number] Date of Birth/Sex: 26-Sep-1945 (70 y.o. Female) Treating RN: Afful, RN, BSN, [place] Primary Care Physician: Caryl Bis, ERIC Other Clinician: Referring Physician: Caryl Bis, ERIC Treating Physician/Extender: Frann Rider in Treatment: 15 Clinic Level of Care Assessment Items TOOL  4 Quantity Score []  - Use when only an EandM is performed on FOLLOW-UP visit 0 ASSESSMENTS - Nursing Assessment / Reassessment X - Reassessment of Co-morbidities (includes updates in patient status) 1 10 X - Reassessment of Adherence to Treatment Plan 1 5 ASSESSMENTS - Wound and Skin Assessment / Reassessment X - Simple Wound Assessment / Reassessment - one wound 1 5 []  - Complex Wound Assessment / Reassessment - multiple wounds 0 []  - Dermatologic / Skin Assessment (not related to wound area) 0 ASSESSMENTS - Focused Assessment []  - Circumferential Edema Measurements - multi extremities 0 []  - Nutritional Assessment / Counseling / Intervention 0 []  - Lower Extremity Assessment (monofilament, tuning fork, pulses) 0 []  - Peripheral Arterial Disease Assessment (using hand held doppler) 0 ASSESSMENTS - Ostomy and/or Continence Assessment and Care []  - Incontinence Assessment and Management 0 []  - Ostomy Care Assessment and Management (repouching, etc.) 0 PROCESS - Coordination of Care X - Simple Patient / Family Education for ongoing care 1 15 []  - Complex (extensive) Patient / Family Education for ongoing care 0 []  - Staff obtains Programmer, systems, Records, Test Results / Process Orders 0 []  - Staff telephones HHA, Nursing Homes / Clarify orders / etc 0 []  - Routine Transfer to another Facility (non-emergent condition) 0 CHARDONAE, OLLER ([id number]) []  - Routine Hospital Admission (non-emergent condition) 0 []  - New Admissions / Biomedical engineer / Ordering NPWT, Apligraf, etc. 0 []  - Emergency Hospital Admission (emergent condition) 0 []  - Simple Discharge Coordination 0 []  - Complex (extensive) Discharge Coordination 0 PROCESS - Special Needs []  - Pediatric / Minor Patient Management 0 []  - Isolation Patient Management 0 []  - Hearing / Language / Visual special needs 0 []  - Assessment of Community assistance (transportation, D/C planning, etc.) 0 []  - Additional assistance / Altered  mentation 0 []  - Support Surface(s) Assessment (bed, cushion, seat, etc.) 0 INTERVENTIONS - Wound Cleansing / Measurement X - Simple Wound Cleansing - one wound 1 5 []  -  Complex Wound Cleansing - multiple wounds 0 X - Wound Imaging (photographs - any number of wounds) 1 5 []  - Wound Tracing (instead of photographs) 0 X - Simple Wound Measurement - one wound 1 5 []  - Complex Wound Measurement - multiple wounds 0 INTERVENTIONS - Wound Dressings X - Small Wound Dressing one or multiple wounds 1 10 []  - Medium Wound Dressing one or multiple wounds 0 []  - Large Wound Dressing one or multiple wounds 0 []  - Application of Medications - topical 0 []  - Application of Medications - injection 0 INTERVENTIONS - Miscellaneous []  - External ear exam 0 ANIAYA, DURAL. ([id number]) []  - Specimen Collection (cultures, biopsies, blood, body fluids, etc.) 0 []  - Specimen(s) / Culture(s) sent or taken to Lab for analysis 0 []  - Patient Transfer (multiple staff / Harrel Lemon Lift / Similar devices) 0 []  - Simple Staple / Suture removal (25 or less) 0 []  - Complex Staple / Suture removal (26 or more) 0 []  - Hypo / Hyperglycemic Management (close monitor of Blood Glucose) 0 []  - Ankle / Brachial Index (ABI) - do not check if billed separately 0 X - Vital Signs 1 5 Has the patient been seen at the hospital within the last three years: Yes Total Score: 65 Level Of Care: New/Established - Level 2 Electronic Signature(s) Signed: 04/15/2016 1:04:27 PM By: Regan Lemming BSN, RN Entered By: Regan Lemming on 04/15/2016 11:25:59 [name] ([id number]) -------------------------------------------------------------------------------- Complex / Palliative Patient Assessment Details Patient Name: [name] Date of Service: 04/15/2016 11:00 AM Medical Record Number: [id number] Patient Account Number: [id number] Date of Birth/Sex: Oct 09, 1945 (70 y.o. Female) Treating RN: Cornell Barman Primary Care Physician:  Caryl Bis, ERIC Other Clinician: Referring Physician: Caryl Bis, ERIC Treating Physician/Extender: Frann Rider in Treatment: 15 Palliative Management Criteria Complex Wound Management Criteria Patient has remarkable or complex co-morbidities requiring medications or treatments that extend wound healing times. Examples: o Diabetes mellitus with chronic renal failure or end stage renal disease requiring dialysis o Advanced or poorly controlled rheumatoid arthritis o Diabetes mellitus and end stage chronic obstructive pulmonary disease o Active cancer with current chemo- or radiation therapy bilateral ovarian masses with associated peritoneal metastasis disease Care Approach Wound Care Plan: Complex Wound Management Electronic Signature(s) Signed: 04/19/2016 4:49:31 PM By: Gretta Cool RN, BSN, Kim RN, BSN Signed: 04/20/2016 4:36:48 PM By: Christin Fudge MD, FACS Previous Signature: 04/19/2016 4:49:22 PM Version By: Gretta Cool RN, BSN, Kim RN, BSN Entered By: Gretta Cool, RN, BSN, Kim on 04/19/2016 16:49:31 Duch, Sofie Hartigan ([id number]) -------------------------------------------------------------------------------- Encounter Discharge Information Details Patient Name: [name] Date of Service: 04/15/2016 11:00 AM Medical Record Number: [id number] Patient Account Number: [id number] Date of Birth/Sex: 1945-07-20 (70 y.o. Female) Treating RN: Baruch Gouty, RN, BSN, Velva Harman Primary Care Physician: Caryl Bis, ERIC Other Clinician: Referring Physician: Caryl Bis, ERIC Treating Physician/Extender: Frann Rider in Treatment: 15 Encounter Discharge Information Items Discharge Pain Level: 0 Discharge Condition: Stable Ambulatory Status: Cane Discharge Destination: Home Transportation: Private Auto Accompanied By: Micheline Rough Schedule Follow-up Appointment: No Medication Reconciliation completed and provided to Patient/Care No Talal Fritchman: Provided on Clinical Summary of  Care: 04/15/2016 Form Type Recipient Paper Patient SR Electronic Signature(s) Signed: 04/15/2016 2:07:14 PM By: Regan Lemming BSN, RN Previous Signature: 04/15/2016 11:30:09 AM Version By: Ruthine Dose Entered By: Regan Lemming on 04/15/2016 14:07:14 [name] ([id number]) -------------------------------------------------------------------------------- Lower Extremity Assessment Details Patient Name: [name] Date of Service: 04/15/2016 11:00 AM Medical Record Number: [id number] Patient Account Number: [id number] Date of Birth/Sex: April 13, 1946 (70 y.o. Female)  Treating RN: Baruch Gouty, RN, BSN, Velva Harman Primary Care Physician: Caryl Bis, ERIC Other Clinician: Referring Physician: Caryl Bis, ERIC Treating Physician/Extender: Frann Rider in Treatment: 15 Vascular Assessment Pulses: Posterior Tibial Dorsalis Pedis Palpable: [Left:Yes] Extremity colors, hair growth, and conditions: Extremity Color: [Left:Normal] Hair Growth on Extremity: [Left:No] Temperature of Extremity: [Left:Warm] Capillary Refill: [Left:< 3 seconds] Toe Nail Assessment Left: Right: Thick: No Discolored: No Deformed: No Improper Length and Hygiene: No Electronic Signature(s) Signed: 04/15/2016 10:56:56 AM By: Regan Lemming BSN, RN Entered By: Regan Lemming on 04/15/2016 10:56:56 [name] ([id number]) -------------------------------------------------------------------------------- Multi Wound Chart Details Patient Name: [name] Date of Service: 04/15/2016 11:00 AM Medical Record Number: [id number] Patient Account Number: [id number] Date of Birth/Sex: 12/11/1945 (70 y.o. Female) Treating RN: Baruch Gouty, RN, BSN, Velva Harman Primary Care Physician: Caryl Bis, ERIC Other Clinician: Referring Physician: Caryl Bis, ERIC Treating Physician/Extender: Frann Rider in Treatment: 15 Vital Signs Height(in): 63 Pulse(bpm): 78 Weight(lbs): 130 Blood Pressure 128/72 (mmHg): Body Mass  Index(BMI): 23 Temperature(F): 98 Respiratory Rate 16 (breaths/min): Photos: [2:No Photos] [N/A:N/A] Wound Location: [2:Left Calcaneus] [N/A:N/A] Wounding Event: [2:Pressure Injury] [N/A:N/A] Primary Etiology: [2:Pressure Ulcer] [N/A:N/A] Secondary Etiology: [2:Diabetic Wound/Ulcer of the Lower Extremity] [N/A:N/A] Comorbid History: [2:Cataracts, Type I Diabetes, Rheumatoid Arthritis, Osteoarthritis, Neuropathy, Received Chemotherapy] [N/A:N/A] Date Acquired: [2:07/15/2015] [N/A:N/A] Weeks of Treatment: [2:15] [N/A:N/A] Wound Status: [2:Open] [N/A:N/A] Measurements L x W x D 0.5x0.5x0.6 [N/A:N/A] (cm) Area (cm) : [2:0.196] [N/A:N/A] Volume (cm) : [2:0.118] [N/A:N/A] % Reduction in Area: [2:16.90%] [N/A:N/A] % Reduction in Volume: -25.50% [N/A:N/A] Classification: [2:Category/Stage III] [N/A:N/A] HBO Classification: [2:Grade 1] [N/A:N/A] Exudate Amount: [2:Medium] [N/A:N/A] Exudate Type: [2:Serous] [N/A:N/A] Exudate Color: [2:amber] [N/A:N/A] Wound Margin: [2:Flat and Intact] [N/A:N/A] Granulation Amount: [2:None Present (0%)] [N/A:N/A] Necrotic Amount: [2:Medium (34-66%)] [N/A:N/A] Exposed Structures: [2:Fascia: No Fat: No] [N/A:N/A] Tendon: No Muscle: No Joint: No Bone: No Limited to Skin Breakdown Epithelialization: None N/A N/A Periwound Skin Texture: Induration: Yes N/A N/A Edema: No Excoriation: No Callus: No Crepitus: No Fluctuance: No Friable: No Rash: No Scarring: No Periwound Skin Maceration: Yes N/A N/A Moisture: Moist: Yes Dry/Scaly: No Periwound Skin Color: Atrophie Blanche: No N/A N/A Cyanosis: No Ecchymosis: No Erythema: No Hemosiderin Staining: No Mottled: No Pallor: No Rubor: No Temperature: No Abnormality N/A N/A Tenderness on Yes N/A N/A Palpation: Wound Preparation: Ulcer Cleansing: N/A N/A Rinsed/Irrigated with Saline Topical Anesthetic Applied: Other: lidocaine 4% Treatment Notes Electronic Signature(s) Signed: 04/15/2016  1:04:27 PM By: Regan Lemming BSN, RN Entered By: Regan Lemming on 04/15/2016 11:24:27 [name] ([id number]) -------------------------------------------------------------------------------- [place] Details Patient Name: [name] Date of Service: 04/15/2016 11:00 AM Medical Record Number: [id number] Patient Account Number: [id number] Date of Birth/Sex: 1945/08/31 (70 y.o. Female) Treating RN: Afful, RN, BSN, [place] Primary Care Physician: Caryl Bis, ERIC Other Clinician: Referring Physician: Caryl Bis, ERIC Treating Physician/Extender: Frann Rider in Treatment: 15 Active Inactive Orientation to the Wound Care Program Nursing Diagnoses: Knowledge deficit related to the wound healing center program Goals: Patient/caregiver will verbalize understanding of the [facility] Program Date Initiated: 12/31/2015 Goal Status: Active Interventions: Provide education on orientation to the wound center Notes: Pressure Nursing Diagnoses: Knowledge deficit related to causes and risk factors for pressure ulcer development Knowledge deficit related to management of pressures ulcers Potential for impaired tissue integrity related to pressure, friction, moisture, and shear Goals: Patient will remain free from development of additional pressure ulcers Date Initiated: 12/31/2015 Goal Status: Active Patient will remain free of pressure ulcers Date Initiated: 12/31/2015 Goal Status: Active Patient/caregiver will verbalize risk factors  for pressure ulcer development Date Initiated: 12/31/2015 Goal Status: Active Patient/caregiver will verbalize understanding of pressure ulcer management Date Initiated: 12/31/2015 Goal Status: Active Interventions: KAIDAN, ANELLI ([id number]) Assess: immobility, friction, shearing, incontinence upon admission and as needed Assess offloading mechanisms upon admission and as needed Assess potential for pressure ulcer  upon admission and as needed Provide education on pressure ulcers Treatment Activities: Patient referred for pressure reduction/relief devices : 12/31/2015 Notes: Wound/Skin Impairment Nursing Diagnoses: Impaired tissue integrity Knowledge deficit related to smoking impact on wound healing Knowledge deficit related to ulceration/compromised skin integrity Goals: Patient/caregiver will verbalize understanding of skin care regimen Date Initiated: 12/31/2015 Goal Status: Active Ulcer/skin breakdown will have a volume reduction of 30% by week 4 Date Initiated: 12/31/2015 Goal Status: Active Ulcer/skin breakdown will have a volume reduction of 50% by week 8 Date Initiated: 12/31/2015 Goal Status: Active Ulcer/skin breakdown will have a volume reduction of 80% by week 12 Date Initiated: 12/31/2015 Goal Status: Active Ulcer/skin breakdown will heal within 14 weeks Date Initiated: 12/31/2015 Goal Status: Active Interventions: Assess patient/caregiver ability to obtain necessary supplies Assess patient/caregiver ability to perform ulcer/skin care regimen upon admission and as needed Assess ulceration(s) every visit Provide education on ulcer and skin care Treatment Activities: Skin care regimen initiated : 12/31/2015 Topical wound management initiated : 12/31/2015 Notes: DAPHNY, ODEM ([id number]) Electronic Signature(s) Signed: 04/15/2016 1:04:27 PM By: Regan Lemming BSN, RN Entered By: Regan Lemming on 04/15/2016 11:24:15 [name] ([id number]) -------------------------------------------------------------------------------- Pain Assessment Details Patient Name: [name] Date of Service: 04/15/2016 11:00 AM Medical Record Number: [id number] Patient Account Number: [id number] Date of Birth/Sex: 02-09-1946 (70 y.o. Female) Treating RN: Afful, RN, BSN, Velva Harman Primary Care Physician: Caryl Bis, ERIC Other Clinician: Referring Physician: Caryl Bis, ERIC Treating  Physician/Extender: Frann Rider in Treatment: 15 Active Problems Location of Pain Severity and Description of Pain Patient Has Paino No Site Locations With Dressing Change: No Pain Management and Medication Current Pain Management: Electronic Signature(s) Signed: 04/15/2016 10:56:21 AM By: Regan Lemming BSN, RN Entered By: Regan Lemming on 04/15/2016 10:56:21 [name] ([id number]) -------------------------------------------------------------------------------- Patient/Caregiver Education Details Patient Name: [name] Date of Service: 04/15/2016 11:00 AM Medical Record Number: [id number] Patient Account Number: [id number] Date of Birth/Gender: 11/09/1945 (70 y.o. Female) Treating RN: Baruch Gouty, RN, BSN, Velva Harman Primary Care Physician: Caryl Bis, ERIC Other Clinician: Referring Physician: Caryl Bis, ERIC Treating Physician/Extender: Frann Rider in Treatment: 15 Education Assessment Education Provided To: Patient Education Topics Provided Pressure: Methods: Explain/Verbal Responses: State content correctly Welcome To [facility]: Methods: Explain/Verbal Responses: State content correctly Wound/Skin Impairment: Methods: Explain/Verbal Responses: State content correctly Electronic Signature(s) Signed: 04/15/2016 3:49:14 PM By: Regan Lemming BSN, RN Entered By: Regan Lemming on 04/15/2016 14:07:33 [name] ([id number]) -------------------------------------------------------------------------------- Wound Assessment Details Patient Name: [name] Date of Service: 04/15/2016 11:00 AM Medical Record Number: [id number] Patient Account Number: [id number] Date of Birth/Sex: 1945-11-18 (70 y.o. Female) Treating RN: Afful, RN, BSN, Velva Harman Primary Care Physician: Caryl Bis, ERIC Other Clinician: Referring Physician: Caryl Bis, ERIC Treating Physician/Extender: Frann Rider in Treatment: 15 Wound Status Wound Number: 2 Primary  Pressure Ulcer Etiology: Wound Location: Left Calcaneus Secondary Diabetic Wound/Ulcer of the Lower Wounding Event: Pressure Injury Etiology: Extremity Date Acquired: 07/15/2015 Wound Open Weeks Of Treatment: 15 Status: Clustered Wound: No Comorbid Cataracts, Type I Diabetes, History: Rheumatoid Arthritis, Osteoarthritis, Neuropathy, Received Chemotherapy Photos Photo Uploaded By: Regan Lemming on 04/15/2016 15:48:43 Wound Measurements Length: (cm) 0.5 Width: (cm) 0.5 Depth: (cm) 0.6 Area: (cm) 0.196 Volume: (  cm) 0.118 % Reduction in Area: 16.9% % Reduction in Volume: -25.5% Epithelialization: None Tunneling: No Undermining: No Wound Description Classification: Category/Stage III Foul Odor Diabetic Severity (Wagner): Grade 1 Wound Margin: Flat and Intact Exudate Amount: Medium Exudate Type: Serous Exudate Color: amber After Cleansing: No Wound Bed Granulation Amount: None Present (0%) Exposed Structure LAIKYN, MCPHAUL. ([id number]) Necrotic Amount: Medium (34-66%) Fascia Exposed: No Necrotic Quality: Adherent Slough Fat Layer Exposed: No Tendon Exposed: No Muscle Exposed: No Joint Exposed: No Bone Exposed: No Limited to Skin Breakdown Periwound Skin Texture Texture Color No Abnormalities Noted: No No Abnormalities Noted: No Callus: No Atrophie Blanche: No Crepitus: No Cyanosis: No Excoriation: No Ecchymosis: No Fluctuance: No Erythema: No Friable: No Hemosiderin Staining: No Induration: Yes Mottled: No Localized Edema: No Pallor: No Rash: No Rubor: No Scarring: No Temperature / Pain Moisture Temperature: No Abnormality No Abnormalities Noted: No Tenderness on Palpation: Yes Dry / Scaly: No Maceration: Yes Moist: Yes Wound Preparation Ulcer Cleansing: Rinsed/Irrigated with Saline Topical Anesthetic Applied: Other: lidocaine 4%, Treatment Notes Wound #2 (Left Calcaneus) 1. Cleansed with: Clean wound with Normal Saline 4. Dressing  Applied: Prisma Ag 5. Secondary Dressing Applied Bordered Foam Dressing Notes polymem and coverlette bandaid when she gets home Electronic Signature(s) Signed: 04/15/2016 1:04:27 PM By: Regan Lemming BSN, RN Entered By: Regan Lemming on 04/15/2016 11:15:24 [name] ([id number]) -------------------------------------------------------------------------------- Vitals Details Patient Name: [name] Date of Service: 04/15/2016 11:00 AM Medical Record Number: [id number] Patient Account Number: [id number] Date of Birth/Sex: 02/10/1946 (70 y.o. Female) Treating RN: Afful, RN, BSN, [place] Primary Care Physician: Caryl Bis, ERIC Other Clinician: Referring Physician: Caryl Bis, ERIC Treating Physician/Extender: Frann Rider in Treatment: 15 Vital Signs Time Taken: 11:18 Temperature (F): 98 Height (in): 63 Pulse (bpm): 78 Weight (lbs): 130 Respiratory Rate (breaths/min): 16 Body Mass Index (BMI): 23 Blood Pressure (mmHg): 128/72 Reference Range: 80 - 120 mg / dl Electronic Signature(s) Signed: 04/15/2016 1:04:27 PM By: Regan Lemming BSN, RN Entered By: Regan Lemming on 04/15/2016 11:18:22

## 2016-04-16 NOTE — Progress Notes (Signed)
SALEEN, ATMORE ([id number]) Visit Report for 04/15/2016 Chief Complaint Document Details Patient Name: [name], [name] 04/15/2016 11:00 Date of Service: AM Medical Record [id number] Number: Patient Account Number: [id number] October 01, 1945 (70 y.o. Treating RN: Baruch Gouty, RN, BSN, Velva Harman Date of Birth/Sex: Female) Other Clinician: Primary Care Physician: Caryl Bis, ERIC Treating Savanna Dooley Referring Physician: Caryl Bis, ERIC Physician/Extender: Suella Grove in Treatment: 15 Information Obtained from: Patient Chief Complaint [name] presents for evaluation of her left posterior heel pressure ulcer Electronic Signature(s) Signed: 04/15/2016 11:27:34 AM By: Christin Fudge MD, FACS Entered By: Christin Fudge on 04/15/2016 11:27:34 [name] ([id number]) -------------------------------------------------------------------------------- HPI Details Patient Name: [name], [name] 04/15/2016 11:00 Date of Service: AM Medical Record [id number] Number: Patient Account Number: [id number] [birth year] (70 y.o. Treating RN: Afful, RN, BSN, Velva Harman Date of Birth/Sex: Female) Other Clinician: Primary Care Physician: Caryl Bis, ERIC Treating Nabilah Davoli Referring Physician: Caryl Bis, ERIC Physician/Extender: Weeks in Treatment: 15 History of Present Illness Location: right heel Quality: Patient reports experiencing a dull pain to affected area(s). Severity: Patient states wound are getting worse. Duration: Patient has had the wound for <5 months prior to presenting for treatment Timing: Pain in wound is Intermittent (comes and goes Context: The wound appeared gradually over time Modifying Factors: Consults to this date include: local care was done as per the medication given by the PCP which may have been medihoney Associated Signs and Symptoms: Patient reports having difficulty standing for long periods. 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 In the past she was seen for a decubitus ulcer on her right gluteal area. She is known to have diabetes mellitus type 1 and her most recent A1c was 7.2% Past medical history significant for leukopenia, cholelithiasis, hypertension, chronic diastolic CHF, coronary artery disease,type 1 diabetes mellitus, rheumatoid arthritis, collagen vascular disease, ovarian cancer, status post CABG in 2014 and status post Port-A-Cath insertion by Dr. Leotis Pain in October 2016. She has quit smoking about 20 years ago. She was advised to use Medihoney with calcium alginate pads to be applied over the wound. 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 01/07/2016 -- x-ray of the left heel done -- IMPRESSION: No radiographic evidence of osteomyelitis. If this remains a clinical concern, recommend MR imaging. 02/05/16: returns today for f/u. denies fever, chills, body aches or malaise. no interval changes regarding health status. 02/12/16: pt is doing well. wound has improved. no s/s of infection. no systemic s/s of infection. She reports will be on vacation next week, and requests appointment in 2 weeks. 03/18/2016 - is going to be starting her chemotherapy this coming Monday and will have cycles every 3 weeks 04-01-16 [name], accompanied by her husband, presents for evaluation of her left posterior heel stage III pressure ulcer. She states that she started chemotherapy last week. Her chemotherapy is on a cycle of AVALEI, BELD. ([id number]) one week  on 3 weeks off, with her next treatment scheduled for December 11. There has been an increase in drainage since her last appointment. She denies any  pain. They have been using PolyMem silver rope. Electronic Signature(s) Signed: 04/15/2016 11:27:41 AM By: Christin Fudge MD, FACS Entered By: Christin Fudge on 04/15/2016 11:27:41 [name] ([id number]) -------------------------------------------------------------------------------- Physical Exam Details Patient Name: [name], [name] 04/15/2016 11:00 Date of Service: AM Medical Record [id number] Number: Patient Account Number: [id number] 11-07-1945 (70 y.o. Treating RN: Baruch Gouty, RN, BSN, Velva Harman Date of Birth/Sex: Female) Other Clinician: Primary Care Physician: Caryl Bis, ERIC Treating Kees Idrovo Referring Physician: Caryl Bis, ERIC Physician/Extender: Weeks in Treatment: 15 Constitutional . Pulse regular. Respirations normal and unlabored. Afebrile. . Eyes Nonicteric. Reactive to light. Ears, Nose, Mouth, and Throat Lips, teeth, and gums WNL.Marland Kitchen Moist mucosa without lesions. Neck supple and nontender. No palpable supraclavicular or cervical adenopathy. Normal sized without goiter. Respiratory WNL. No retractions.. Breath sounds WNL, No rubs, rales, rhonchi, or wheeze.. Cardiovascular Heart rhythm and rate regular, no murmur or gallop.. Pedal Pulses WNL. No clubbing, cyanosis or edema. Chest Breasts symmetical and no nipple discharge.. Breast tissue WNL, no masses, lumps, or tenderness.. Lymphatic No adneopathy. No adenopathy. No adenopathy. Musculoskeletal Adexa without tenderness or enlargement.. Digits and nails w/o clubbing, cyanosis, infection, petechiae, ischemia, or inflammatory conditions.. Integumentary (Hair, Skin) No suspicious lesions. No crepitus or fluctuance. No peri-wound warmth or erythema. No masses.Marland Kitchen Psychiatric Judgement and insight Intact.. No evidence of depression, anxiety, or  agitation.. Notes was looking excellent and there is no maceration in the surrounding areas and the depth of the wound is clean. No sharp debridement was required today. Electronic Signature(s) Signed: 04/15/2016 11:28:05 AM By: Christin Fudge MD, FACS Entered By: Christin Fudge on 04/15/2016 11:28:04 [name] ([id number]) -------------------------------------------------------------------------------- Physician Orders Details Patient Name: [name], [name] 04/15/2016 11:00 Date of Service: AM Medical Record [id number] Number: Patient Account Number: [id number] January 18, 1946 (70 y.o. Treating RN: Afful, RN, BSN, Velva Harman Date of Birth/Sex: Female) Other Clinician: Primary Care Physician: Caryl Bis, ERIC Treating Vermelle Cammarata Referring Physician: Caryl Bis, ERIC Physician/Extender: Suella Grove in Treatment: 53 Verbal / Phone Orders: Yes Clinician: Afful, RN, BSN, Rita Read Back and Verified: Yes Diagnosis Coding Wound Cleansing Wound #2 Left Calcaneus o Cleanse wound with mild soap and water o May Shower, gently pat wound dry prior to applying new dressing. Anesthetic Wound #2 Left Calcaneus o Topical Lidocaine 4% cream applied to wound bed prior to debridement Skin Barriers/Peri-Wound Care Wound #2 Left Calcaneus o Skin Prep Primary Wound Dressing Wound #2 Left Calcaneus o Cutimed Sorbact Hydroactive B o Other: - polymem ag use plain packing in clinic till you get home Secondary Dressing Wound #2 Left Calcaneus o Boardered Foam Dressing - or Coverllette Bandaid Dressing Change Frequency Wound #2 Left Calcaneus o Change dressing every other day. Follow-up Appointments Wound #2 Left Calcaneus o Return Appointment in 1 week. Off-Loading Wound #2 Left Calcaneus TISHARA, LORCH. ([id number]) o Heel suspension boot to: - ELevate on pillows Additional Orders / Instructions Wound #2 Left Calcaneus o Increase protein intake. o Activity as  tolerated o Other: - INclude Vitamin A, C, ZInc, MVI in diet o Other: - RUN grafix for verificatioin Electronic Signature(s) Signed: 04/15/2016 1:04:27 PM By: Regan Lemming BSN, RN Signed: 04/15/2016 3:52:57 PM By: Christin Fudge MD, FACS Entered By: Regan Lemming on 04/15/2016 11:25:34 [name] ([id number]) -------------------------------------------------------------------------------- Problem List Details Patient Name: [name], [name]. 04/15/2016 11:00 Date of Service: AM Medical Record [id number] Number: Patient Account Number: [id number] 1945/08/23 (70 y.o. Treating RN: Baruch Gouty, RN, BSN, Velva Harman Date of Birth/Sex: Female) Other Clinician: Primary Care Physician: Caryl Bis, ERIC Treating Christin Fudge Referring Physician:  SONNENBERG, ERIC Physician/Extender: Weeks in Treatment: 15 Active Problems ICD-10 Encounter Code Description Active Date Diagnosis E10.621 Type 1 diabetes mellitus with foot ulcer 12/31/2015 Yes L89.623 Pressure ulcer of left heel, stage 3 12/31/2015 Yes Z92.21 Personal history of antineoplastic chemotherapy 12/31/2015 Yes Z79.52 Long term (current) use of systemic steroids 12/31/2015 Yes Inactive Problems Resolved Problems Electronic Signature(s) Signed: 04/15/2016 11:27:27 AM By: Christin Fudge MD, FACS Entered By: Christin Fudge on 04/15/2016 11:27:26 [name] ([id number]) -------------------------------------------------------------------------------- Progress Note Details Patient Name: [name], [name]. 04/15/2016 11:00 Date of Service: AM Medical Record [id number] Number: Patient Account Number: [id number] [birth year] (70 y.o. Treating RN: Baruch Gouty, RN, BSN, Velva Harman Date of Birth/Sex: Female) Other Clinician: Primary Care Physician: Caryl Bis, ERIC Treating Braxton Weisbecker Referring Physician: Caryl Bis, ERIC Physician/Extender: Suella Grove in Treatment: 15 Subjective Chief Complaint Information obtained from Patient Mrs. Clemmer presents for  evaluation of her left posterior heel pressure ulcer History of Present Illness (HPI) The following HPI elements were documented for the patient's wound: Location: right heel Quality: Patient reports experiencing a dull pain to affected area(s). Severity: Patient states wound are getting worse. Duration: Patient has had the wound for Timing: Pain in wound is Intermittent (comes and goes Context: The wound appeared gradually over time Modifying Factors: Consults to this date include: local care was done as per the medication given by the PCP which may have been medihoney Associated Signs and Symptoms: Patient reports having difficulty standing for long periods. 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 In the past she was seen for a decubitus ulcer on her right gluteal area. She is known to have diabetes mellitus type 1 and her most recent A1c was 7.2% Past medical history significant for leukopenia, cholelithiasis, hypertension, chronic diastolic CHF, coronary artery disease,type 1 diabetes mellitus, rheumatoid arthritis, collagen vascular disease, ovarian cancer, status post CABG in 2014 and status post Port-A-Cath insertion by Dr. Leotis Pain in October 2016. She has quit smoking about 20 years ago. She was advised to use Medihoney with calcium alginate pads to be applied over the wound. 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 01/07/2016 -- x-ray of the left heel done -- IMPRESSION: No radiographic evidence of  osteomyelitis. If this remains a clinical concern, recommend MR imaging. 02/05/16: returns today for f/u. denies fever, chills, body aches or malaise. no interval changes regarding health status. 02/12/16: pt is doing well. wound has improved. no s/s of infection. no systemic s/s of infection. She reports [name], [name]. ([id number]) will be on vacation next week, and requests appointment in 2 weeks. 03/18/2016 - is going to be starting her chemotherapy this coming Monday and will have cycles every 3 weeks 04-01-16 [name], accompanied by her husband, presents for evaluation of her left posterior heel stage III pressure ulcer. She states that she started chemotherapy last week. Her chemotherapy is on a cycle of one week on 3 weeks off, with her next treatment scheduled for December 11. There has been an increase in drainage since her last appointment. She denies any pain.  They have been using PolyMem silver rope. Objective Constitutional Pulse regular. Respirations normal and unlabored. Afebrile. Vitals Time Taken: 11:18 AM, Height: 63 in, Weight: 130 lbs, BMI: 23, Temperature: 98 F, Pulse: 78 bpm, Respiratory Rate: 16 breaths/min, Blood Pressure: 128/72 mmHg. Eyes Nonicteric. Reactive to light. Ears, Nose, Mouth, and Throat Lips, teeth, and gums WNL.Marland Kitchen Moist mucosa without lesions. Neck supple and nontender. No palpable supraclavicular or cervical adenopathy. Normal sized without goiter. Respiratory WNL. No retractions.. Breath sounds WNL, No rubs, rales, rhonchi, or wheeze.. Cardiovascular Heart rhythm and rate regular, no murmur or gallop.. Pedal Pulses WNL. No clubbing, cyanosis or edema. Chest Breasts symmetical and no nipple discharge.. Breast tissue WNL, no masses, lumps, or tenderness.. Lymphatic No adneopathy. No adenopathy. No adenopathy. Musculoskeletal Adexa without tenderness or enlargement.. Digits and nails w/o clubbing, cyanosis, infection, petechiae, ischemia,  or inflammatory conditions.Marland Kitchen Psychiatric Judgement and insight Intact.. No evidence of depression, anxiety, or agitation.Stann Mainland, Sofie Hartigan ([id number]) General Notes: was looking excellent and there is no maceration in the surrounding areas and the depth of the wound is clean. No sharp debridement was required today. Integumentary (Hair, Skin) No suspicious lesions. No crepitus or fluctuance. No peri-wound warmth or erythema. No masses.. Wound #2 status is Open. Original cause of wound was Pressure Injury. The wound is located on the Left Calcaneus. The wound measures 0.5cm length x 0.5cm width x 0.6cm depth; 0.196cm^2 area and 0.118cm^3 volume. The wound is limited to skin breakdown. There is no tunneling or undermining noted. There is a medium amount of serous drainage noted. The wound margin is flat and intact. There is no granulation within the wound bed. There is a medium (34-66%) amount of necrotic tissue within the wound bed including Adherent Slough. The periwound skin appearance exhibited: Induration, Maceration, Moist. The periwound skin appearance did not exhibit: Callus, Crepitus, Excoriation, Fluctuance, Friable, Localized Edema, Rash, Scarring, Dry/Scaly, Atrophie Blanche, Cyanosis, Ecchymosis, Hemosiderin Staining, Mottled, Pallor, Rubor, Erythema. Periwound temperature was noted as No Abnormality. The periwound has tenderness on palpation. Assessment Active Problems ICD-10 E10.621 - Type 1 diabetes mellitus with foot ulcer L89.623 - Pressure ulcer of left heel, stage 3 Z92.21 - Personal history of antineoplastic chemotherapy Z79.52 - Long term (current) use of systemic steroids Plan Wound Cleansing: Wound #2 Left Calcaneus: Cleanse wound with mild soap and water May Shower, gently pat wound dry prior to applying new dressing. Anesthetic: Wound #2 Left Calcaneus: Topical Lidocaine 4% cream applied to wound bed prior to debridement Skin Barriers/Peri-Wound Care: Wound  #2 Left Calcaneus: Skin Prep Primary Wound Dressing: [name], [name] ([id number]) Wound #2 Left Calcaneus: Cutimed Sorbact Hydroactive B Other: - polymem ag use plain packing in clinic till you get home Secondary Dressing: Wound #2 Left Calcaneus: Boardered Foam Dressing - or Coverllette Bandaid Dressing Change Frequency: Wound #2 Left Calcaneus: Change dressing every other day. Follow-up Appointments: Wound #2 Left Calcaneus: Return Appointment in 1 week. Off-Loading: Wound #2 Left Calcaneus: Heel suspension boot to: - ELevate on pillows Additional Orders / Instructions: Wound #2 Left Calcaneus: Increase protein intake. Activity as tolerated Other: - INclude Vitamin A, C, ZInc, MVI in diet Other: - RUN grafix for verificatioin The patient's wound has now gotten to a stage where it is clean and in spite of every good effort and rigorous conservative therapy she has not healed for the last 15 weeks. I believe I put strongly recommend a skin substitute like Grafix to be used and we will try and run this through her insurance.  I have recommended going back to packing the wound with PolyMem silver rope, and we will change this every other day. She will be seen back in a weeks' time. Electronic Signature(s) Signed: 04/15/2016 11:30:16 AM By: Christin Fudge MD, FACS Entered By: Christin Fudge on 04/15/2016 11:30:16 [name] ([id number]) -------------------------------------------------------------------------------- SuperBill Details Patient Name: [name] Date of Service: 04/15/2016 Medical Record Patient Account Number: [id number] Number: Afful, RN, BSN, Treating RN: 02-Mar-1946 (70 y.o. Velva Harman Date of Birth/Sex: Female) Other Clinician: Primary Care Physician: Caryl Bis, ERIC Treating Christin Fudge Referring Physician: Caryl Bis, ERIC Physician/Extender: Suella Grove in Treatment: 15 Diagnosis Coding ICD-10 Codes Code Description E10.621 Type 1  diabetes mellitus with foot ulcer L89.623 Pressure ulcer of left heel, stage 3 Z92.21 Personal history of antineoplastic chemotherapy Z79.52 Long term (current) use of systemic steroids Facility Procedures CPT4 Code: [id number] Description: [id number] - WOUND CARE VISIT-LEV 2 EST PT Modifier: Quantity: 1 Physician Procedures CPT4 Code: [id number] Description: [id number] - WC PHYS LEVEL 3 - EST PT ICD-10 Description Diagnosis E10.621 Type 1 diabetes mellitus with foot ulcer L89.623 Pressure ulcer of left heel, stage 3 Z92.21 Personal history of antineoplastic chemothera Z79.52 Long term (current) use  of systemic steroids Modifier: py Quantity: 1 Electronic Signature(s) Signed: 04/15/2016 11:30:35 AM By: Christin Fudge MD, FACS Entered By: Christin Fudge on 04/15/2016 11:30:35

## 2016-04-17 ENCOUNTER — Other Ambulatory Visit: Payer: Self-pay | Admitting: Hematology and Oncology

## 2016-04-17 ENCOUNTER — Encounter: Payer: Self-pay | Admitting: Hematology and Oncology

## 2016-04-17 NOTE — Progress Notes (Signed)
[facility] day:  04/18/2016   Chief Complaint: [name] is a 70 y.o. female with stage IV ovarian cancer who is seen for assessment prior to day 8 of cycle #2 carboplatin and gemcitabine.  HPI:  The patient was last seen in the medical oncology clinic on 04/11/2016.  At that time, she received day 1 of cycle #2 carboplatin and gemcitabine. She denied any complaint.  Magnesium was 1.6 on oral magnesium.  Magnesium was supplemented.  During the interim, her insurance approved Eliquis.  She switched from Lovenox to Eliquis on 04/16/2016.  Symptomatically, she feels good.  She denies any complaints.   Past Medical History:  Diagnosis Date  . CAD (coronary artery disease)    a. 09/2012 Cath: LM nl, LAD 95p, 71m LCX 924mOM2 50, RCA 100.  . Marland Kitchenholelithiasis   . Chronic diastolic CHF (congestive heart failure) ([facility]  . Collagen vascular disease ([facility]  . Esophageal stricture   . Exertional shortness of breath   . GERD (gastroesophageal reflux disease)   . H/O hiatal hernia   . Herniated disc   . History of pancytopenia   . Hyperlipidemia   . Hypertension   . Hypokalemia   . Leukopenia 2012   s/p bone marrow biopsy, Dr. PaMa Hillock. NSTEMI (non-ST elevated myocardial infarction) ([place]/2014   "mild" (10/18/2012)  . Ovarian cancer ([facility]2016   chemo  . Pneumonia 2013; 08/2012   "one lung; double" (10/18/2012)  . Rheumatoid arthritis(714.0)   . Type I diabetes mellitus ([place]   "dx'd in 1957" (10/18/2012)    Past Surgical History:  Procedure Laterality Date  . ABDOMINAL HYSTERECTOMY  06/15/2015   Dx L/S, EXLAP TAH BSO omentectomy RSRx colostomy diaphragm resection stripping  . CARDIAC CATHETERIZATION  10/18/2012   "first one was today" (10/18/2012)  . CATARACT EXTRACTION W/ INTRAOCULAR LENS IMPLANT Right 2010  . CHOLECYSTECTOMY  06/15/2015   combined case with ovarian cancer debulking  . COLON SURGERY    . CORONARY ARTERY BYPASS  GRAFT N/A 10/19/2012   Procedure: CORONARY ARTERY BYPASS GRAFTING (CABG);  Surgeon: StMelrose NakayamaMD;  [facility] Service: Open Heart Surgery;  Laterality: N/A;  . ESOPHAGEAL DILATION     "3 or 4 times" (10/19/2011)  . ESOPHAGOGASTRODUODENOSCOPY  2012   Dr. IfMinna Merritts. OSTOMY    . OVARY SURGERY     removal  . PERIPHERAL VASCULAR CATHETERIZATION N/A 03/02/2015   Procedure: PoGlori Luisath Insertion;  Surgeon: JaAlgernon HuxleyMD;  [facility];  Service: Cardiovascular;  Laterality: N/A;  . TUBAL LIGATION  1970    Family History  Problem Relation Age of Onset  . Diabetes Mother   . Arthritis Mother   . Diabetes Father   . Arthritis Father   . Bone cancer Sister     Social History:  reports that she quit smoking about 21 years ago. Her smoking use included Cigarettes. She has a 30.00 pack-year smoking history. She has never used smokeless tobacco. She reports that she does not drink alcohol or use drugs.  She is accompanied by her husband today.  Allergies:  Allergies  Allergen Reactions  . Codeine Nausea And Vomiting  . Latex Rash    Current Medications: Current Outpatient Prescriptions  Medication Sig Dispense Refill  . Calcium-Vitamin D 600-200 MG-UNIT tablet Take 2 tablets by mouth daily.     . folic acid (FOLVITE) 1 MG  tablet TAKE 1 TABLET BY MOUTH ONCE A DAY 90 tablet 3  . furosemide (LASIX) 80 MG tablet Take 80 mg by mouth daily as needed.     . Insulin Human (INSULIN PUMP) SOLN Pt uses Humalog insulin.    Marland Kitchen insulin lispro (HUMALOG) 100 UNIT/ML injection Inject 0.06 mLs (6 Units total) into the skin daily. 30 mL 3  . lidocaine-prilocaine (EMLA) cream Apply 1 application topically as needed (prior to accessing port).    . loratadine (CLARITIN) 10 MG tablet Take 1 tablet by mouth daily as needed.    Marland Kitchen losartan (COZAAR) 100 MG tablet Take 1 tablet (100 mg total) by mouth daily. 90 tablet 1  . magnesium oxide (MAG-OX) 400 (241.3 Mg) MG tablet Take 1  tablet (400 mg total) by mouth daily. 30 tablet 2  . metoprolol tartrate (LOPRESSOR) 25 MG tablet Take 1 tablet (25 mg total) by mouth 2 (two) times daily. 180 tablet 3  . ondansetron (ZOFRAN) 8 MG tablet TAKE 1 TABLET BY MOUTH EVERY 8 HOURS AS NEEDED FOR NAUSEA OR VOMITING 30 tablet 1  . ONE TOUCH ULTRA TEST test strip     . oxyCODONE (OXY IR/ROXICODONE) 5 MG immediate release tablet Take 5 mg by mouth every 6 (six) hours as needed. Reported on 11/17/2015    . pantoprazole (PROTONIX) 40 MG tablet TAKE 1 TABLET BY MOUTH TWICE A DAY 180 tablet 1  . potassium chloride SA (K-DUR,KLOR-CON) 20 MEQ tablet TAKE 2 TABLETS BY MOUTH ONCE A DAY 60 tablet 1  . ranitidine (ZANTAC) 150 MG tablet Take 1 tablet (150 mg total) by mouth 2 (two) times daily. 180 tablet 1  . vitamin C (ASCORBIC ACID) 500 MG tablet Take 500 mg by mouth daily.    Marland Kitchen zinc gluconate 50 MG tablet Take 50 mg by mouth daily.    Marland Kitchen ELIQUIS 5 MG TABS tablet     . enoxaparin (LOVENOX) 60 MG/0.6ML injection Inject 0.6 mLs (60 mg total) into the skin every 12 (twelve) hours. (Patient not taking: Reported on 04/18/2016) 10 Syringe 0   No current facility-administered medications for this visit.    Facility-Administered Medications Ordered in Other Visits  Medication Dose Route Frequency Provider Last Rate Last Dose  . sodium chloride 0.9 % injection 10 mL  10 mL Intracatheter PRN Lequita Asal, MD      . sodium chloride 0.9 % injection 10 mL  10 mL Intravenous PRN Lequita Asal, MD   10 mL at 04/13/15 9604    Review of Systems:  GENERAL:  Feels good.  No fever, chills or sweats.  Weight down 2 pounds. PERFORMANCE STATUS (ECOG):  1 HEENT:  No visual changes, sore throat, mouth sores or tenderness. Lungs:  No shortness of breath or cough.  No hemoptysis. Cardiac:  No chest pain, palpitations, orthopnea, or PND. GI:  No abdominal discomfort.  No nausea, vomiting, diarrhea, melena or hematochezia. GU:  No urgency, frequency, dysuria,  or hematuria. Musculoskeletal:  Severe rheumatoid arthritis (off MTX).  Osteoporosis.  No back pain. No muscle tenderness. Extremities:  No pain or swelling. Skin:  No increased bruising or bleeding on Eliquis.  No rashes or skin changes. Neuro:  Neuropathy (stable).  No headache, numbness or weakness, balance or coordination issues. Endocrine:  Diabetes on an insulin pump.  No thyroid issues, hot flashes or night sweats. Psych:  No mood changes, depression or anxiety. Pain: No pain. Review of systems:  All other systems reviewed and found to be  negative.  Physical Exam: Blood pressure 112/66, pulse 79, temperature 97.2 F (36.2 C), temperature source Tympanic, resp. rate 18, weight 138 lb 10.7 oz (62.9 kg). GENERAL:  Well developed, well nourished woman sitting comfortably in the exam room in no acute distress. MENTAL STATUS:  Alert and oriented to person, place and time. HEAD:  Curly gray hair.  Normocephalic, atraumatic, face symmetric, no Cushingoid features. EYES:  Gold rimmed glasses.  Blue eyes.  No conjunctivitis or scleral icterus.  ENT: Oropharynx clear without lesion. Tongue normal. Mucous membranes moist.  RESPIRATORY: Clear to auscultation without rales, wheezes or rhonchi. CARDIOVASCULAR: Regular rate and rhythm without murmur, rub or gallop. ABDOMEN: Soft, non-tender with active bowel sounds and no appreciable hepatosplenomegaly. No palpable nodularity or masses. SKIN: No rashes, ulcers, or bruises. EXTREMITIES:  No edema, skin discoloration or tenderness. No palpable cords. LYMPH NODES: No palpable cervical, supraclavicular, axillary or inguinal adenopathy  NEUROLOGICAL: Appropriate. PSYCH: Appropriate.   Appointment on 04/18/2016  Component Date Value Ref Range Status  . WBC 04/18/2016 1.9* 3.6 - 11.0 K/uL Final  . RBC 04/18/2016 2.82* 3.80 - 5.20 MIL/uL Final  . Hemoglobin 04/18/2016 8.4* 12.0 - 16.0 g/dL Final  . HCT 04/18/2016 24.6* 35.0 - 47.0 % Final   . MCV 04/18/2016 87.1  80.0 - 100.0 fL Final  . MCH 04/18/2016 29.8  26.0 - 34.0 pg Final  . MCHC 04/18/2016 34.2  32.0 - 36.0 g/dL Final  . RDW 04/18/2016 16.4* 11.5 - 14.5 % Final  . Platelets 04/18/2016 134* 150 - 440 K/uL Final  . Neutrophils Relative % 04/18/2016 59  % Final  . Neutro Abs 04/18/2016 1.1* 1.4 - 6.5 K/uL Final  . Lymphocytes Relative 04/18/2016 27  % Final  . Lymphs Abs 04/18/2016 0.5* 1.0 - 3.6 K/uL Final  . Monocytes Relative 04/18/2016 12  % Final  . Monocytes Absolute 04/18/2016 0.2  0.2 - 0.9 K/uL Final  . Eosinophils Relative 04/18/2016 1  % Final  . Eosinophils Absolute 04/18/2016 0.0  0 - 0.7 K/uL Final  . Basophils Relative 04/18/2016 1  % Final  . Basophils Absolute 04/18/2016 0.0  0 - 0.1 K/uL Final  . Sodium 04/18/2016 133* 135 - 145 mmol/L Final  . Potassium 04/18/2016 4.2  3.5 - 5.1 mmol/L Final  . Chloride 04/18/2016 101  101 - 111 mmol/L Final  . CO2 04/18/2016 26  22 - 32 mmol/L Final  . Glucose, Bld 04/18/2016 175* 65 - 99 mg/dL Final  . BUN 04/18/2016 21* 6 - 20 mg/dL Final  . Creatinine, Ser 04/18/2016 0.70  0.44 - 1.00 mg/dL Final  . Calcium 04/18/2016 8.6* 8.9 - 10.3 mg/dL Final  . Total Protein 04/18/2016 6.7  6.5 - 8.1 g/dL Final  . Albumin 04/18/2016 3.1* 3.5 - 5.0 g/dL Final  . AST 04/18/2016 40  15 - 41 U/L Final  . ALT 04/18/2016 57* 14 - 54 U/L Final  . Alkaline Phosphatase 04/18/2016 115  38 - 126 U/L Final  . Total Bilirubin 04/18/2016 0.5  0.3 - 1.2 mg/dL Final  . GFR calc non Af Amer 04/18/2016 >60  >60 mL/min Final  . GFR calc Af Amer 04/18/2016 >60  >60 mL/min Final   Comment: (NOTE) The eGFR has been calculated using the CKD EPI equation. This calculation has not been validated in all clinical situations. eGFR's persistently <60 mL/min signify possible Chronic Kidney Disease.   . Anion gap 04/18/2016 6  5 - 15 Final  . Magnesium 04/18/2016 1.7  1.7 - 2.4 mg/dL Final    Assessment:  KELLSEY SANSONE is a 70 y.o. female  with stage IV ovarian cancer.  She presented with abdominal discomfort and bloating.  Omental biopsy on 02/23/2015 revealed metastatic high grade serous carcinoma, consistent with gynecologic origin.   She was initially diagnosed with clinical stage IIIC (T3cN1Mx).  CA125 was 707 on 02/17/2015.  Abdomen and pelvic CT scan on 02/13/2015 revealed bilateral mass-like adnexal regions (right adnexal mass 5.6 x 5.0 cm and the left adnexa mass 10.3 x 6.0 x 9.9 cm).  There was a large amount of soft tissue throughout the peritoneal cavity involving the omentum and other peritoneal surfaces.  There was a small volume ascites. There was a peripheral 3.3 x 1.9 cm low-attenuation lesion overlying the right lobe of the liver , likely representing a serosal implant. There was 1.4 x 2.2 cm ill-defined peripheral lesion within the inferior aspect of segment 6 adjacent to the ampulla in the duodenum.   She received 4 cycles of neoadjuvant carboplatin and Taxol (03/05/2015 - 05/22/2015).  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  She had loose stools on oral magnesium.  She was initially on Neurontin then switched Lyrica with cycle #3.  Cycle #4 was notable for neutropenia (ANC 300) requiring GCSF x 3 days.    CA125 was 802.9 on 03/30/2015, 567.9 on 04/13/2015, 168.8 on 05/15/2015, 85.2 on 07/17/2015, 68.6 on 07/28/2015, 34.5 on 09/17/2015, 20.7 on 11/06/2015, 49 on 01/22/2016, 106.1 on 02/22/2016, 138.8 on 03/07/2016, 220.8 on 03/21/2016, and 152.8 on 04/11/2016.  Abdominal and pelvic CT scan on 04/28/2015 revealed decreasing bilateral ovarian masses.   The left adnexal mass measured 4.0 x 6.1 cm (previously 5.0 x 8.5 cm). The right ovary measured 3.8 x 4.9 cm (previously 4.7 x 5.6 cm).  There was improved peritoneal carcinomatosis.  There was a small amount of ascites.  The hepatic dome lesion was stable. The previously seen right hepatic lobe lesion was not well-visualized.  There was a small left pleural effusion  and trace right pleural effusion.  The nodular lesion at the ampulla of Vater, extending into the duodenum was stable.  There was no biliary ductal dilatation.  She underwent exploratory laparotomy, lysis of adhesions, total abdominal hysterectomy with bilateral salpingo-oophorectomy, infracolic omentectomy, optimal tumor debulking(< 1 cm), recto-sigmoid resection with creation of end colostomy, cholecystectomy, mobilization of splenic flexure and liver with diaphragmatic stripping on 06/15/2015. The right diaphragm was cleared of tumor. During dissection, the diaphragm was entered and closed with sutures.   She has had a recurrent right sided pleural effusion.  She underwent thoracentesis of 650 cc on post-operative day 3.  She was admitted to [facility] on 07/01/2015 and 07/07/2015 for recurrent shortness of breath.  She has undergone 2 additional thoracentesis (1.1 L on 07/02/2015 and 850 cc on 07/08/2015).  Cytology was negative x 2.  Bilateral lower extremity duplex on 07/03/2015 was negative.  Echo on 07/08/2015 revealed en EF of 55-60%.    She has severe rheumatoid arthritis.  Methotrexate and Enbrel were initially on hold.  She has a normocytic anemia.  Work-up on 02/17/2015 revealed a normal ferritin, B12, folate, TSH.  She denies any melena or hematochezia.    She has anemia likely due to chronic disease. She received 1 unit PRBCs during her admission at [facility]. She denies any melena or hematochezia. She has diabetes and is on an insulin pump.  Rght upper extremity ultrasound on 08/01/2015 revealed a near occlusive thrombus within the  central portion of the right internal jugular vein and central portion of the right subclavian vein. She was on Lovenox 60 mg twice a day.  She switched to Eliquis on 04/16/2016.  She was admitted to [facility] from 07/29/2015 - 08/10/2015 with a right-sided empyema and liver abscess. She underwent CT-guided placement of a liver abscess drain on 07/30/2015. Liver abscess  culture grew out group B strep and Enterobacter which was sensitive to Zosyn. She was transitioned to ertapenem Colbert Ewing) prior to discharge.  She was readmitted to [facility] on 09/17/2015.  Chest, abdomen, and pelvic CT scan on 09/01/2015 revealed continued decrease in perihepatic fluid collection contiguous with the right pleural space with percutaneous drain. Drain was removed on 09/01/2015.  Chest, abdomen, and pelvic CT scan on 09/11/2015 revealed a residual versus recurrent fluid collection posterior to the right hepatic lobe (2.6 x 1.1 x 4.0 cm).  Chest, abdomen, and pelvic CT scan on 10/13/2015 revealed resolution of the empyema.    She has had persistent neutropenia felt secondary to her rheumatoid arthritis.  Folate and MMA were normal.  TSH was 6.13 (high) with a free T4 of 1.17 (0.61-1.12).  She began methotrexate (10 mg a week) and prednisone (5 mg a day) for severe rheumatoid arthritis on 12/17/2015.  Bone marrow aspirate and biopsy on 06/09/2010 revealed a hypercellular marrow (70%) with no evidence of dysplasia or malignancy.  Flow cytometry was negative.  Cytogenetics were normal (46,XX).  FISH studies were negative for MDS.  Bone marrow aspirate and biopsy on 12/03/2015 revealed a normocellular to mildy hypercellular marrow for age (40%) with left shifted myelopoiesis, non specific dyserythropoiesis and mild megakaryocytic atypia with no increase in blasts.  There were multiple small nonspecific lymphoid aggregates (favor reactive). There was no increase in reticulin.  There was decreased myeloid cells (37%) with left shifted maturation and 1% atypical myelod blasts.  There was relatively increased monocytic cells (11%), relatively increased lymphoid cells (36%), and relatively increased eosinophils (6%).  Cytogenetics were normal (42, XX).  SNP microarray was normal.  Alkaline phosphatase was 348 (38-126) on 11/06/2015 and 431 on 11/17/2015.   Fractionated alkaline phosphatase on 11/09/2015  revealed 21% bone and 79% liver.  MRI of the abdomen on 12/14/2015 revealed mild extrahepatic biliary duct dilatation without obstructing lesion identified.  Chest, abdomen, and pelvic CT scan on 02/15/2016 revealed multiple soft tissue nodules throughout the pelvis, small bowel mesenteric and peritoneum and, concerning for peritoneal metastatic disease.  There was soft tissue irregularity within the left upper quadrant.  There was mild right hydronephrosis (etiology unclear).  There was interval resolution of previously described right pleural based fluid and gas collection. There was a pleural based nodule within the right lower hemi-thorax concerning for pleural based metastasis.  There were small bilateral pleural effusions.  There was pulmonary nodularity, predominately within the left upper lobe (metastatic or infectious/inflammatory etiology).  There was slightly increased mediastinal adenopathy (infectious/inflammatory or metastatic).  There was a low attenuation lesion within the left hepatic lobe (complicated fluid within the fissure or metastatic disease).  She was on tamoxifen from 02/25/2016 - 03/14/2016.  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  Abdomen and pelvic CT scan on 03/08/2016 revealed mild progression of peritoneal disease in the abdomen.  There was interval increase in loculated fluid around the lateral segment left liver, stomach, and spleen.  There was persistent soft tissue lesion at the level of the ampulla with mild intra and extrahepatic biliary duct dilatation.  There was  persistent intrahepatic and capsular metastatic disease involving the liver.  She is currently day 8 of cycle #2 carboplatin and gemcitabine (03/21/2016 - 04/11/2016) with Neulasta support.  Cycle #1 was truncated secondary to low counts at initiation of treatment.  Nadir platelet count was 107,000 on day #11.  Symptomatically, she feels good.  She has moderate leukopenia.  Magnesium  is normal.  Plan: 1.  Labs today:  CBC with diff, CMP, Mg. 2.  Discuss today's counts.  Discuss leukopenia.  Discuss platelet count.  Options include treatment today with Neulasta support.  Risk of neutropenia reviewed.  Discuss postponing chemotherapy today (only day 1 chemotherapy as with cycle #1) and giving GCSF x 3 days with recheck of counts on 04/21/2016.  Discuss improving CA125.  Discuss upcoming Christmas holiday.  After some discussion, decision made to defer day 8 chemotherapy and give GCSF.  Next cycle, will consider short acting GCSF after day 1. 3.  No chemotherapy today. 4.  No chemo today 5.  Neupogen 300 mcg SQ x 3 days (12/18, 12/19, 12/20) 6.  RTC on 04/21/2016 for labs (CBC with diff) +/- Neupogen.  Goal ANC 3000-4000 (will ensure when counts drop that ANC will remain adequate > 1500). 7.  RTC on 04/26/2016 for CBC with diff 8.  RTC on 05/03/2016 for MD assessment, labs (CBC with diff, CMP, Mg, CA125), and day 1 of cycle #3 carboplatin and gemcitabine.   Lequita Asal, MD  04/18/2016, 11:20 AM

## 2016-04-18 ENCOUNTER — Inpatient Hospital Stay (HOSPITAL_BASED_OUTPATIENT_CLINIC_OR_DEPARTMENT_OTHER): Payer: Medicare Other | Admitting: Hematology and Oncology

## 2016-04-18 ENCOUNTER — Inpatient Hospital Stay: Payer: Medicare Other

## 2016-04-18 ENCOUNTER — Encounter: Payer: Self-pay | Admitting: Hematology and Oncology

## 2016-04-18 VITALS — BP 112/66 | HR 79 | Temp 97.2°F | Resp 18 | Wt 138.7 lb

## 2016-04-18 DIAGNOSIS — C569 Malignant neoplasm of unspecified ovary: Secondary | ICD-10-CM

## 2016-04-18 DIAGNOSIS — C786 Secondary malignant neoplasm of retroperitoneum and peritoneum: Secondary | ICD-10-CM

## 2016-04-18 DIAGNOSIS — Z5111 Encounter for antineoplastic chemotherapy: Secondary | ICD-10-CM

## 2016-04-18 DIAGNOSIS — Z7901 Long term (current) use of anticoagulants: Secondary | ICD-10-CM

## 2016-04-18 DIAGNOSIS — I2510 Atherosclerotic heart disease of native coronary artery without angina pectoris: Secondary | ICD-10-CM

## 2016-04-18 DIAGNOSIS — Z794 Long term (current) use of insulin: Secondary | ICD-10-CM

## 2016-04-18 DIAGNOSIS — I999 Unspecified disorder of circulatory system: Secondary | ICD-10-CM

## 2016-04-18 DIAGNOSIS — E785 Hyperlipidemia, unspecified: Secondary | ICD-10-CM

## 2016-04-18 DIAGNOSIS — Z8701 Personal history of pneumonia (recurrent): Secondary | ICD-10-CM

## 2016-04-18 DIAGNOSIS — R188 Other ascites: Secondary | ICD-10-CM

## 2016-04-18 DIAGNOSIS — I82C11 Acute embolism and thrombosis of right internal jugular vein: Secondary | ICD-10-CM

## 2016-04-18 DIAGNOSIS — D701 Agranulocytosis secondary to cancer chemotherapy: Secondary | ICD-10-CM

## 2016-04-18 DIAGNOSIS — K8020 Calculus of gallbladder without cholecystitis without obstruction: Secondary | ICD-10-CM

## 2016-04-18 DIAGNOSIS — K769 Liver disease, unspecified: Secondary | ICD-10-CM

## 2016-04-18 DIAGNOSIS — E108 Type 1 diabetes mellitus with unspecified complications: Secondary | ICD-10-CM

## 2016-04-18 DIAGNOSIS — C801 Malignant (primary) neoplasm, unspecified: Principal | ICD-10-CM

## 2016-04-18 DIAGNOSIS — Z8719 Personal history of other diseases of the digestive system: Secondary | ICD-10-CM

## 2016-04-18 DIAGNOSIS — M069 Rheumatoid arthritis, unspecified: Secondary | ICD-10-CM

## 2016-04-18 DIAGNOSIS — Z7689 Persons encountering health services in other specified circumstances: Secondary | ICD-10-CM | POA: Diagnosis not present

## 2016-04-18 DIAGNOSIS — Z9049 Acquired absence of other specified parts of digestive tract: Secondary | ICD-10-CM

## 2016-04-18 DIAGNOSIS — E876 Hypokalemia: Secondary | ICD-10-CM

## 2016-04-18 DIAGNOSIS — I10 Essential (primary) hypertension: Secondary | ICD-10-CM

## 2016-04-18 DIAGNOSIS — I5032 Chronic diastolic (congestive) heart failure: Secondary | ICD-10-CM

## 2016-04-18 DIAGNOSIS — N1330 Unspecified hydronephrosis: Secondary | ICD-10-CM

## 2016-04-18 DIAGNOSIS — Z808 Family history of malignant neoplasm of other organs or systems: Secondary | ICD-10-CM

## 2016-04-18 DIAGNOSIS — Z86718 Personal history of other venous thrombosis and embolism: Secondary | ICD-10-CM

## 2016-04-18 DIAGNOSIS — C787 Secondary malignant neoplasm of liver and intrahepatic bile duct: Secondary | ICD-10-CM | POA: Diagnosis not present

## 2016-04-18 DIAGNOSIS — Z79899 Other long term (current) drug therapy: Secondary | ICD-10-CM

## 2016-04-18 DIAGNOSIS — Z87891 Personal history of nicotine dependence: Secondary | ICD-10-CM

## 2016-04-18 DIAGNOSIS — T451X5A Adverse effect of antineoplastic and immunosuppressive drugs, initial encounter: Secondary | ICD-10-CM

## 2016-04-18 DIAGNOSIS — I252 Old myocardial infarction: Secondary | ICD-10-CM

## 2016-04-18 DIAGNOSIS — G629 Polyneuropathy, unspecified: Secondary | ICD-10-CM

## 2016-04-18 DIAGNOSIS — Z9641 Presence of insulin pump (external) (internal): Secondary | ICD-10-CM

## 2016-04-18 DIAGNOSIS — D72819 Decreased white blood cell count, unspecified: Secondary | ICD-10-CM

## 2016-04-18 DIAGNOSIS — K219 Gastro-esophageal reflux disease without esophagitis: Secondary | ICD-10-CM

## 2016-04-18 LAB — COMPREHENSIVE METABOLIC PANEL
ALT: 57 U/L — ABNORMAL HIGH (ref 14–54)
AST: 40 U/L (ref 15–41)
Albumin: 3.1 g/dL — ABNORMAL LOW (ref 3.5–5.0)
Alkaline Phosphatase: 115 U/L (ref 38–126)
Anion gap: 6 (ref 5–15)
BUN: 21 mg/dL — ABNORMAL HIGH (ref 6–20)
CO2: 26 mmol/L (ref 22–32)
Calcium: 8.6 mg/dL — ABNORMAL LOW (ref 8.9–10.3)
Chloride: 101 mmol/L (ref 101–111)
Creatinine, Ser: 0.7 mg/dL (ref 0.44–1.00)
GFR calc Af Amer: >60 mL/min (ref >60)
GFR calc non Af Amer: >60 mL/min (ref >60)
Glucose, Bld: 175 mg/dL — ABNORMAL HIGH (ref 65–99)
Potassium: 4.2 mmol/L (ref 3.5–5.1)
Sodium: 133 mmol/L — ABNORMAL LOW (ref 135–145)
Total Bilirubin: 0.5 mg/dL (ref 0.3–1.2)
Total Protein: 6.7 g/dL (ref 6.5–8.1)

## 2016-04-18 LAB — CBC WITH DIFFERENTIAL/PLATELET
Basophils Absolute: 0 10*3/uL (ref 0–0.1)
Basophils Relative: 1 %
Eosinophils Absolute: 0 10*3/uL (ref 0–0.7)
Eosinophils Relative: 1 %
HCT: 24.6 % — ABNORMAL LOW (ref 35.0–47.0)
Hemoglobin: 8.4 g/dL — ABNORMAL LOW (ref 12.0–16.0)
Lymphocytes Relative: 27 %
Lymphs Abs: 0.5 10*3/uL — ABNORMAL LOW (ref 1.0–3.6)
MCH: 29.8 pg (ref 26.0–34.0)
MCHC: 34.2 g/dL (ref 32.0–36.0)
MCV: 87.1 fL (ref 80.0–100.0)
Monocytes Absolute: 0.2 10*3/uL (ref 0.2–0.9)
Monocytes Relative: 12 %
Neutro Abs: 1.1 10*3/uL — ABNORMAL LOW (ref 1.4–6.5)
Neutrophils Relative %: 59 %
Platelets: 134 10*3/uL — ABNORMAL LOW (ref 150–440)
RBC: 2.82 MIL/uL — ABNORMAL LOW (ref 3.80–5.20)
RDW: 16.4 % — ABNORMAL HIGH (ref 11.5–14.5)
WBC: 1.9 10*3/uL — ABNORMAL LOW (ref 3.6–11.0)

## 2016-04-18 LAB — MAGNESIUM: Magnesium: 1.7 mg/dL (ref 1.7–2.4)

## 2016-04-18 MED ORDER — FILGRASTIM 300 MCG/0.5ML IJ SOSY: 300.0000 ug | PREFILLED_SYRINGE | Freq: Every day | INTRAMUSCULAR | Status: DC

## 2016-04-18 MED ORDER — HEPARIN SOD (PORK) LOCK FLUSH 100 UNIT/ML IV SOLN
500.0000 [IU] | Freq: Once | INTRAVENOUS | Status: AC
  Administered 2016-04-18: 500 [IU] via INTRAVENOUS
  Filled 2016-04-18: qty 5

## 2016-04-18 MED ORDER — SODIUM CHLORIDE 0.9% FLUSH
10.0000 mL | Freq: Once | INTRAVENOUS | Status: AC
  Administered 2016-04-18: 10 mL via INTRAVENOUS
  Filled 2016-04-18: qty 10

## 2016-04-18 MED ORDER — TBO-FILGRASTIM 300 MCG/0.5ML ~~LOC~~ SOSY
300.0000 ug | PREFILLED_SYRINGE | Freq: Once | SUBCUTANEOUS | Status: AC
  Administered 2016-04-18: 300 ug via SUBCUTANEOUS
  Filled 2016-04-18: qty 0.5

## 2016-04-19 ENCOUNTER — Inpatient Hospital Stay: Payer: Medicare Other

## 2016-04-19 DIAGNOSIS — C569 Malignant neoplasm of unspecified ovary: Secondary | ICD-10-CM | POA: Diagnosis not present

## 2016-04-19 LAB — CA 125: CA 125: 125.6 U/mL — ABNORMAL HIGH (ref 0.0–38.1)

## 2016-04-19 MED ORDER — TBO-FILGRASTIM 300 MCG/0.5ML ~~LOC~~ SOSY
300.0000 ug | PREFILLED_SYRINGE | Freq: Once | SUBCUTANEOUS | Status: AC
  Administered 2016-04-19: 300 ug via SUBCUTANEOUS
  Filled 2016-04-19: qty 0.5

## 2016-04-19 MED ORDER — FILGRASTIM 300 MCG/0.5ML IJ SOSY: 300.0000 ug | PREFILLED_SYRINGE | Freq: Once | INTRAMUSCULAR | Status: DC

## 2016-04-20 ENCOUNTER — Inpatient Hospital Stay: Payer: Medicare Other

## 2016-04-20 DIAGNOSIS — C569 Malignant neoplasm of unspecified ovary: Secondary | ICD-10-CM | POA: Diagnosis not present

## 2016-04-20 MED ORDER — TBO-FILGRASTIM 300 MCG/0.5ML ~~LOC~~ SOSY: 300.0000 ug | PREFILLED_SYRINGE | Freq: Once | SUBCUTANEOUS | Status: DC

## 2016-04-20 MED ORDER — TBO-FILGRASTIM 300 MCG/0.5ML ~~LOC~~ SOSY
300.0000 ug | PREFILLED_SYRINGE | Freq: Once | SUBCUTANEOUS | Status: AC
  Administered 2016-04-20: 300 ug via SUBCUTANEOUS
  Filled 2016-04-20: qty 0.5

## 2016-04-20 MED ORDER — FILGRASTIM 300 MCG/0.5ML IJ SOSY: 300.0000 ug | PREFILLED_SYRINGE | Freq: Once | INTRAMUSCULAR | Status: DC

## 2016-04-21 ENCOUNTER — Inpatient Hospital Stay: Payer: Medicare Other

## 2016-04-21 ENCOUNTER — Telehealth: Payer: Self-pay | Admitting: *Deleted

## 2016-04-21 ENCOUNTER — Other Ambulatory Visit: Payer: Self-pay | Admitting: *Deleted

## 2016-04-21 DIAGNOSIS — C569 Malignant neoplasm of unspecified ovary: Secondary | ICD-10-CM

## 2016-04-21 DIAGNOSIS — C786 Secondary malignant neoplasm of retroperitoneum and peritoneum: Secondary | ICD-10-CM

## 2016-04-21 DIAGNOSIS — I82C11 Acute embolism and thrombosis of right internal jugular vein: Secondary | ICD-10-CM

## 2016-04-21 DIAGNOSIS — C801 Malignant (primary) neoplasm, unspecified: Secondary | ICD-10-CM

## 2016-04-21 LAB — CBC WITH DIFFERENTIAL/PLATELET
Basophils Absolute: 0.2 10*3/uL — ABNORMAL HIGH (ref 0–0.1)
Basophils Relative: 2 %
Eosinophils Absolute: 0.1 10*3/uL (ref 0–0.7)
Eosinophils Relative: 1 %
HCT: 27.9 % — ABNORMAL LOW (ref 35.0–47.0)
Hemoglobin: 9.6 g/dL — ABNORMAL LOW (ref 12.0–16.0)
Lymphocytes Relative: 6 %
Lymphs Abs: 0.6 10*3/uL — ABNORMAL LOW (ref 1.0–3.6)
MCH: 30.4 pg (ref 26.0–34.0)
MCHC: 34.4 g/dL (ref 32.0–36.0)
MCV: 88.3 fL (ref 80.0–100.0)
Monocytes Absolute: 0.7 10*3/uL (ref 0.2–0.9)
Monocytes Relative: 7 %
Neutro Abs: 8.5 10*3/uL — ABNORMAL HIGH (ref 1.4–6.5)
Neutrophils Relative %: 84 %
Platelets: 105 10*3/uL — ABNORMAL LOW (ref 150–440)
RBC: 3.16 MIL/uL — ABNORMAL LOW (ref 3.80–5.20)
RDW: 16.3 % — ABNORMAL HIGH (ref 11.5–14.5)
WBC: 10.1 10*3/uL (ref 3.6–11.0)

## 2016-04-21 NOTE — Telephone Encounter (Signed)
Called patient to inform her that she can restart taking her eliquis as ordered per Dr. Grayland Ormond. Pt voiced understanding

## 2016-04-22 ENCOUNTER — Encounter: Payer: Medicare Other | Admitting: Nurse Practitioner

## 2016-04-22 DIAGNOSIS — E10621 Type 1 diabetes mellitus with foot ulcer: Secondary | ICD-10-CM | POA: Diagnosis not present

## 2016-04-23 NOTE — Progress Notes (Signed)
[name], [name] ([id number]) Visit Report for 04/22/2016 Arrival Information Details Patient Name: [name], [name] Date of Service: 04/22/2016 8:45 AM Medical Record Number: [id number] Patient Account Number: [id number] Date of Birth/Sex: 25-Mar-1946 (70 y.o. Female) Treating RN: Cornell Barman Primary Care Physician: Caryl Bis, ERIC Other Clinician: Referring Physician: Caryl Bis, ERIC Treating Physician/Extender: Cathie Olden in Treatment: 16 Visit Information History Since Last Visit Added or deleted any medications: No Patient Arrived: Cane Any new allergies or adverse reactions: No Arrival Time: 08:51 Had a fall or experienced change in No Accompanied By: husband activities of daily living that may affect Transfer Assistance: None risk of falls: Patient Identification Verified: Yes Signs or symptoms of abuse/neglect since last No Secondary Verification Process Yes visito Completed: Hospitalized since last visit: No Patient Requires Transmission- No Has Dressing in Place as Prescribed: Yes Based Precautions: Pain Present Now: No Patient Has Alerts: Yes Patient Alerts: Patient on Blood Thinner eliquis BID DMII Electronic Signature(s) Signed: 04/22/2016 10:52:44 AM By: Gretta Cool, RN, BSN, Kim RN, BSN Entered By: Gretta Cool, RN, BSN, Kim on 04/22/2016 08:54:55 Orlov, [name] ([id number]) -------------------------------------------------------------------------------- Clinic Level of Care Assessment Details Patient Name: [name] Date of Service: 04/22/2016 8:45 AM Medical Record Number: [id number] Patient Account Number: [id number] Date of Birth/Sex: 01-04-1946 (70 y.o. Female) Treating RN: Cornell Barman Primary Care Physician: Caryl Bis, ERIC Other Clinician: Referring Physician: Caryl Bis, ERIC Treating Physician/Extender: Cathie Olden in Treatment: 16 Clinic Level of Care Assessment Items TOOL 4 Quantity Score []  - Use when only an EandM is  performed on FOLLOW-UP visit 0 ASSESSMENTS - Nursing Assessment / Reassessment X - Reassessment of Co-morbidities (includes updates in patient status) 1 10 X - Reassessment of Adherence to Treatment Plan 1 5 ASSESSMENTS - Wound and Skin Assessment / Reassessment X - Simple Wound Assessment / Reassessment - one wound 1 5 []  - Complex Wound Assessment / Reassessment - multiple wounds 0 []  - Dermatologic / Skin Assessment (not related to wound area) 0 ASSESSMENTS - Focused Assessment []  - Circumferential Edema Measurements - multi extremities 0 []  - Nutritional Assessment / Counseling / Intervention 0 []  - Lower Extremity Assessment (monofilament, tuning fork, pulses) 0 []  - Peripheral Arterial Disease Assessment (using hand held doppler) 0 ASSESSMENTS - Ostomy and/or Continence Assessment and Care []  - Incontinence Assessment and Management 0 []  - Ostomy Care Assessment and Management (repouching, etc.) 0 PROCESS - Coordination of Care X - Simple Patient / Family Education for ongoing care 1 15 []  - Complex (extensive) Patient / Family Education for ongoing care 0 X - Staff obtains Programmer, systems, Records, Test Results / Process Orders 1 10 []  - Staff telephones HHA, Nursing Homes / Clarify orders / etc 0 []  - Routine Transfer to another Facility (non-emergent condition) 0 YAIZA, ROMANSKI. ([id number]) []  - Routine Hospital Admission (non-emergent condition) 0 []  - New Admissions / Insurance Authorizations / Ordering NPWT, Apligraf, etc. 0 []  - Emergency Hospital Admission (emergent condition) 0 X - Simple Discharge Coordination 1 10 []  - Complex (extensive) Discharge Coordination 0 PROCESS - Special Needs []  - Pediatric / Minor Patient Management 0 []  - Isolation Patient Management 0 []  - Hearing / Language / Visual special needs 0 []  - Assessment of Community assistance (transportation, D/C planning, etc.) 0 []  - Additional assistance / Altered mentation 0 []  - Support Surface(s)  Assessment (bed, cushion, seat, etc.) 0 INTERVENTIONS - Wound Cleansing / Measurement X - Simple Wound Cleansing - one wound 1 5 []  - Complex Wound Cleansing - multiple  wounds 0 X - Wound Imaging (photographs - any number of wounds) 1 5 []  - Wound Tracing (instead of photographs) 0 X - Simple Wound Measurement - one wound 1 5 []  - Complex Wound Measurement - multiple wounds 0 INTERVENTIONS - Wound Dressings X - Small Wound Dressing one or multiple wounds 1 10 []  - Medium Wound Dressing one or multiple wounds 0 []  - Large Wound Dressing one or multiple wounds 0 []  - Application of Medications - topical 0 []  - Application of Medications - injection 0 INTERVENTIONS - Miscellaneous []  - External ear exam 0 FINLEY, VINCENZO. ([id number]) []  - Specimen Collection (cultures, biopsies, blood, body fluids, etc.) 0 []  - Specimen(s) / Culture(s) sent or taken to Lab for analysis 0 []  - Patient Transfer (multiple staff / Harrel Lemon Lift / Similar devices) 0 []  - Simple Staple / Suture removal (25 or less) 0 []  - Complex Staple / Suture removal (26 or more) 0 []  - Hypo / Hyperglycemic Management (close monitor of Blood Glucose) 0 []  - Ankle / Brachial Index (ABI) - do not check if billed separately 0 X - Vital Signs 1 5 Has the patient been seen at the hospital within the last three years: Yes Total Score: 85 Level Of Care: New/Established - Level 3 Electronic Signature(s) Signed: 04/22/2016 10:52:44 AM By: Gretta Cool, RN, BSN, Kim RN, BSN Entered By: Gretta Cool, RN, BSN, Kim on 04/22/2016 09:27:07 [name] ([id number]) -------------------------------------------------------------------------------- Encounter Discharge Information Details Patient Name: [name] Date of Service: 04/22/2016 8:45 AM Medical Record Number: [id number] Patient Account Number: [id number] Date of Birth/Sex: 09-28-1945 (70 y.o. Female) Treating RN: Cornell Barman Primary Care Physician: Caryl Bis, ERIC Other  Clinician: Referring Physician: Caryl Bis, ERIC Treating Physician/Extender: Cathie Olden in Treatment: 16 Encounter Discharge Information Items Schedule Follow-up Appointment: No Medication Reconciliation completed and provided to Patient/Care No Aydan [place]: Provided on Clinical Summary of Care: 04/22/2016 Form Type Recipient Paper Patient SR Electronic Signature(s) Signed: 04/22/2016 9:27:28 AM By: Sharon Mt Entered By: Sharon Mt on 04/22/2016 09:27:28 [name] ([id number]) -------------------------------------------------------------------------------- Lower Extremity Assessment Details Patient Name: [name] Date of Service: 04/22/2016 8:45 AM Medical Record Number: [id number] Patient Account Number: [id number] Date of Birth/Sex: 1946-03-06 (70 y.o. Female) Treating RN: Cornell Barman Primary Care Physician: Caryl Bis, ERIC Other Clinician: Referring Physician: Caryl Bis, ERIC Treating Physician/Extender: Cathie Olden in Treatment: 16 Vascular Assessment Pulses: Dorsalis Pedis Palpable: [Left:Yes] Posterior Tibial Extremity colors, hair growth, and conditions: Extremity Color: [Left:Normal] Hair Growth on Extremity: [Left:No] Temperature of Extremity: [Left:Warm] Capillary Refill: [Left:< 3 seconds] Dependent Rubor: [Left:No] Blanched when Elevated: [Left:No] Lipodermatosclerosis: [Left:No] Toe Nail Assessment Left: Right: Thick: No Discolored: No Deformed: No Improper Length and Hygiene: No Electronic Signature(s) Signed: 04/22/2016 10:52:44 AM By: Gretta Cool, RN, BSN, Kim RN, BSN Entered By: Gretta Cool, RN, BSN, Kim on 04/22/2016 09:00:25 [name] ([id number]) -------------------------------------------------------------------------------- Multi Wound Chart Details Patient Name: [name] Date of Service: 04/22/2016 8:45 AM Medical Record Number: [id number] Patient Account Number: [id number] Date of Birth/Sex: 1945-07-15  (70 y.o. Female) Treating RN: Cornell Barman Primary Care Physician: Caryl Bis, ERIC Other Clinician: Referring Physician: Caryl Bis, ERIC Treating Physician/Extender: Cathie Olden in Treatment: 16 Vital Signs Height(in): 63 Pulse(bpm): 79 Weight(lbs): 130 Blood Pressure 122/41 (mmHg): Body Mass Index(BMI): 23 Temperature(F): 98.0 Respiratory Rate 16 (breaths/min): Photos: [N/A:N/A] Wound Location: Left Calcaneus N/A N/A Wounding Event: Pressure Injury N/A N/A Primary Etiology: Pressure Ulcer N/A N/A Secondary Etiology: Diabetic Wound/Ulcer of N/A N/A the Lower Extremity Comorbid History: Cataracts, Type  I N/A N/A Diabetes, Rheumatoid Arthritis, Osteoarthritis, Neuropathy, Received Chemotherapy Date Acquired: 07/15/2015 N/A N/A Weeks of Treatment: 16 N/A N/A Wound Status: Open N/A N/A Measurements L x W x D 0.5x0.5x0.6 N/A N/A (cm) Area (cm) : 0.196 N/A N/A Volume (cm) : 0.118 N/A N/A % Reduction in Area: 16.90% N/A N/A % Reduction in Volume: -25.50% N/A N/A Starting Position 1 5 (o'clock): Ending Position 1 11 (o'clock): Maximum Distance 1 1.1 (cm): ESTELLINE, DEBARGE. ([id number]) Undermining: Yes N/A N/A Classification: Category/Stage III N/A N/A HBO Classification: Grade 1 N/A N/A Exudate Amount: Medium N/A N/A Exudate Type: Sanguinous N/A N/A Exudate Color: red N/A N/A Wound Margin: Flat and Intact N/A N/A Granulation Amount: None Present (0%) N/A N/A Necrotic Amount: Medium (34-66%) N/A N/A Exposed Structures: Fat: Yes N/A N/A Fascia: No Tendon: No Muscle: No Joint: No Bone: No Epithelialization: None N/A N/A Periwound Skin Texture: Induration: Yes N/A N/A Edema: No Excoriation: No Callus: No Crepitus: No Fluctuance: No Friable: No Rash: No Scarring: No Periwound Skin Moist: Yes N/A N/A Moisture: Maceration: No Dry/Scaly: No Periwound Skin Color: Atrophie Blanche: No N/A N/A Cyanosis: No Ecchymosis: No Erythema:  No Hemosiderin Staining: No Mottled: No Pallor: No Rubor: No Temperature: No Abnormality N/A N/A Tenderness on Yes N/A N/A Palpation: Wound Preparation: Ulcer Cleansing: N/A N/A Rinsed/Irrigated with Saline Topical Anesthetic Applied: Other: lidocaine 4% Treatment Notes Wound #2 (Left Calcaneus) MICHE, BRIERE. ([id number]) 1. Cleansed with: Clean wound with Normal Saline 2. Anesthetic Topical Lidocaine 4% cream to wound bed prior to debridement 4. Dressing Applied: Other dressing (specify in notes) 5. Secondary Dressing Applied Bordered Foam Dressing Notes polymem and coverlette bandaid when she gets home Electronic Signature(s) Signed: 04/22/2016 10:49:04 AM By: Rene Kocher, NP, Leah Entered By: Rene Kocher, NP, Leah on 04/22/2016 10:49:04 [name] ([id number]) -------------------------------------------------------------------------------- [place] Details Patient Name: [name] Date of Service: 04/22/2016 8:45 AM Medical Record Number: [id number] Patient Account Number: [id number] Date of Birth/Sex: 29-Jul-1945 (70 y.o. Female) Treating RN: Cornell Barman Primary Care Physician: Caryl Bis, ERIC Other Clinician: Referring Physician: Caryl Bis, ERIC Treating Physician/Extender: Cathie Olden in Treatment: 16 Active Inactive Orientation to the Wound Care Program Nursing Diagnoses: Knowledge deficit related to the wound healing center program Goals: Patient/caregiver will verbalize understanding of the [facility] Program Date Initiated: 12/31/2015 Goal Status: Active Interventions: Provide education on orientation to the wound center Notes: Pressure Nursing Diagnoses: Knowledge deficit related to causes and risk factors for pressure ulcer development Knowledge deficit related to management of pressures ulcers Potential for impaired tissue integrity related to pressure, friction, moisture, and shear Goals: Patient  will remain free from development of additional pressure ulcers Date Initiated: 12/31/2015 Goal Status: Active Patient will remain free of pressure ulcers Date Initiated: 12/31/2015 Goal Status: Active Patient/caregiver will verbalize risk factors for pressure ulcer development Date Initiated: 12/31/2015 Goal Status: Active Patient/caregiver will verbalize understanding of pressure ulcer management Date Initiated: 12/31/2015 Goal Status: Active Interventions: SAAYA, NYHUS ([id number]) Assess: immobility, friction, shearing, incontinence upon admission and as needed Assess offloading mechanisms upon admission and as needed Assess potential for pressure ulcer upon admission and as needed Provide education on pressure ulcers Treatment Activities: Patient referred for pressure reduction/relief devices : 12/31/2015 Notes: Wound/Skin Impairment Nursing Diagnoses: Impaired tissue integrity Knowledge deficit related to smoking impact on wound healing Knowledge deficit related to ulceration/compromised skin integrity Goals: Patient/caregiver will verbalize understanding of skin care regimen Date Initiated: 12/31/2015 Goal Status: Active Ulcer/skin breakdown will have a volume reduction  of 30% by week 4 Date Initiated: 12/31/2015 Goal Status: Active Ulcer/skin breakdown will have a volume reduction of 50% by week 8 Date Initiated: 12/31/2015 Goal Status: Active Ulcer/skin breakdown will have a volume reduction of 80% by week 12 Date Initiated: 12/31/2015 Goal Status: Active Ulcer/skin breakdown will heal within 14 weeks Date Initiated: 12/31/2015 Goal Status: Active Interventions: Assess patient/caregiver ability to obtain necessary supplies Assess patient/caregiver ability to perform ulcer/skin care regimen upon admission and as needed Assess ulceration(s) every visit Provide education on ulcer and skin care Treatment Activities: Skin care regimen initiated : 12/31/2015 Topical wound  management initiated : 12/31/2015 Notes: ANJI, TERMAN ([id number]) Electronic Signature(s) Signed: 04/22/2016 10:52:44 AM By: Gretta Cool, RN, BSN, Kim RN, BSN Entered By: Gretta Cool, RN, BSN, Kim on 04/22/2016 09:14:04 [name] ([id number]) -------------------------------------------------------------------------------- Pain Assessment Details Patient Name: [name] Date of Service: 04/22/2016 8:45 AM Medical Record Number: [id number] Patient Account Number: [id number] Date of Birth/Sex: [birth year] (70 y.o. Female) Treating RN: Cornell Barman Primary Care Physician: Caryl Bis, ERIC Other Clinician: Referring Physician: Caryl Bis, ERIC Treating Physician/Extender: Cathie Olden in Treatment: 16 Active Problems Location of Pain Severity and Description of Pain Patient Has Paino No Site Locations With Dressing Change: No Pain Management and Medication Current Pain Management: Electronic Signature(s) Signed: 04/22/2016 10:52:44 AM By: Gretta Cool, RN, BSN, Kim RN, BSN Entered By: Gretta Cool, RN, BSN, Kim on 04/22/2016 08:52:18 [name] ([id number]) -------------------------------------------------------------------------------- Patient/Caregiver Education Details Patient Name: [name] Date of Service: 04/22/2016 8:45 AM Medical Record Number: [id number] Patient Account Number: [id number] Date of Birth/Gender: 17-Jul-1945 (70 y.o. Female) Treating RN: Cornell Barman Primary Care Physician: Caryl Bis, ERIC Other Clinician: Referring Physician: Caryl Bis, ERIC Treating Physician/Extender: Cathie Olden in Treatment: 16 Education Assessment Education Provided To: Patient Education Topics Provided Pressure: Wound/Skin Impairment: Handouts: Caring for Your Ulcer, Other: continue wound care as prescribed Methods: Demonstration Responses: State content correctly Electronic Signature(s) Signed: 04/22/2016 10:52:44 AM By: Gretta Cool, RN, BSN, Kim RN, BSN Entered  By: Gretta Cool, RN, BSN, Kim on 04/22/2016 09:28:35 [name] ([id number]) -------------------------------------------------------------------------------- Wound Assessment Details Patient Name: [name] Date of Service: 04/22/2016 8:45 AM Medical Record Number: [id number] Patient Account Number: [id number] Date of Birth/Sex: 1946/03/25 (70 y.o. Female) Treating RN: Cornell Barman Primary Care Physician: Caryl Bis, ERIC Other Clinician: Referring Physician: Caryl Bis, ERIC Treating Physician/Extender: Cathie Olden in Treatment: 16 Wound Status Wound Number: 2 Primary Pressure Ulcer Etiology: Wound Location: Left Calcaneus Secondary Diabetic Wound/Ulcer of the Lower Wounding Event: Pressure Injury Etiology: Extremity Date Acquired: 07/15/2015 Wound Open Weeks Of Treatment: 16 Status: Clustered Wound: No Comorbid Cataracts, Type I Diabetes, History: Rheumatoid Arthritis, Osteoarthritis, Neuropathy, Received Chemotherapy Photos Wound Measurements Length: (cm) 0.5 Width: (cm) 0.5 Depth: (cm) 0.6 Area: (cm) 0.196 Volume: (cm) 0.118 % Reduction in Area: 16.9% % Reduction in Volume: -25.5% Epithelialization: None Tunneling: No Undermining: Yes Starting Position (o'clock): 5 Ending Position (o'clock): 11 Maximum Distance: (cm) 1.1 Wound Description Classification: Category/Stage III Diabetic Severity [name]): Grade 1 Wound Margin: Flat and Intact Exudate Amount: Medium Exudate Type: Sanguinous Exudate Color: red Foul Odor After Cleansing: No Wound Bed NABILAH, MATSUYAMA. ([id number]) Granulation Amount: None Present (0%) Exposed Structure Necrotic Amount: Medium (34-66%) Fascia Exposed: No Necrotic Quality: Adherent Slough Fat Layer Exposed: Yes Tendon Exposed: No Muscle Exposed: No Joint Exposed: No Bone Exposed: No Periwound Skin Texture Texture Color No Abnormalities Noted: No No Abnormalities Noted: No Callus: No Atrophie Blanche:  No Crepitus: No Cyanosis: No Excoriation: No Ecchymosis: No Fluctuance: No  Erythema: No Friable: No Hemosiderin Staining: No Induration: Yes Mottled: No Localized Edema: No Pallor: No Rash: No Rubor: No Scarring: No Temperature / Pain Moisture Temperature: No Abnormality No Abnormalities Noted: No Tenderness on Palpation: Yes Dry / Scaly: No Maceration: No Moist: Yes Wound Preparation Ulcer Cleansing: Rinsed/Irrigated with Saline Topical Anesthetic Applied: Other: lidocaine 4%, Treatment Notes Wound #2 (Left Calcaneus) 1. Cleansed with: Clean wound with Normal Saline 2. Anesthetic Topical Lidocaine 4% cream to wound bed prior to debridement 4. Dressing Applied: Other dressing (specify in notes) 5. Secondary Dressing Applied Bordered Foam Dressing Notes polymem and coverlette bandaid when she gets home Electronic Signature(s) Signed: 04/22/2016 10:52:44 AM By: Gretta Cool, RN, BSN, Kim RN, BSN Entered By: Gretta Cool, RN, BSN, Kim on 04/22/2016 09:04:43 Boyde, [name] ([id number]) Stann Mainland, [name] ([id number]) -------------------------------------------------------------------------------- [place] Details Patient Name: [name] Date of Service: 04/22/2016 8:45 AM Medical Record Number: [id number] Patient Account Number: [id number] Date of Birth/Sex: May 15, 1945 (70 y.o. Female) Treating RN: Cornell Barman Primary Care Physician: Caryl Bis, ERIC Other Clinician: Referring Physician: Caryl Bis, ERIC Treating Physician/Extender: Cathie Olden in Treatment: 16 Vital Signs Time Taken: 08:52 Temperature (F): 98.0 Height (in): 63 Pulse (bpm): 79 Weight (lbs): 130 Respiratory Rate (breaths/min): 16 Body Mass Index (BMI): 23 Blood Pressure (mmHg): 122/41 Reference Range: 80 - 120 mg / dl Electronic Signature(s) Signed: 04/22/2016 10:52:44 AM By: Gretta Cool, RN, BSN, Kim RN, BSN Entered By: Gretta Cool, RN, BSN, Kim on 04/22/2016 08:52:35

## 2016-04-23 NOTE — Progress Notes (Addendum)
[name] ([id number]) Visit Report for 04/22/2016 Chief Complaint Document Details Patient Name: [name], [name] 04/22/2016 8:45 Date of Service: AM Medical Record [id number] Number: Patient Account Number: [id number] 1945/12/31 (70 y.o. Treating RN: Cornell Barman Date of Birth/Sex: Female) Other Clinician: Primary Care Physician: Caryl Bis, ERIC Treating Marley Charlot Referring Physician: Caryl Bis, ERIC Physician/Extender: Suella Grove in Treatment: 16 Information Obtained from: Patient Chief Complaint [name] presents for evaluation of her left posterior heel pressure ulcer Electronic Signature(s) Signed: 04/22/2016 10:49:13 AM By: Rene Kocher, NP, Daymion Nazaire Entered By: Rene Kocher, NP, Demarrion Meiklejohn on 04/22/2016 10:49:13 [name], [name] [name] ([id number]) -------------------------------------------------------------------------------- HPI Details Patient Name: [name], [name] 04/22/2016 8:45 Date of Service: AM Medical Record [id number] Number: Patient Account Number: [id number] April 15, 1946 (70 y.o. Treating RN: Cornell Barman Date of Birth/Sex: Female) Other Clinician: Primary Care Physician: Caryl Bis, ERIC Treating Michelene Keniston Referring Physician: Caryl Bis, ERIC Physician/Extender: Suella Grove in Treatment: 16 History of Present Illness Location: right heel Quality: Patient reports experiencing a dull pain to affected area(s). Severity: Patient states wound are getting worse. Duration: Patient has had the wound for <5 months prior to presenting for treatment Timing: Pain in wound is Intermittent (comes and goes Context: The wound appeared gradually over time Modifying Factors: Consults to this date include: local care was done as per the medication given by the PCP which may have been medihoney Associated Signs and Symptoms: Patient reports having difficulty standing for long periods. 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 In the past she was seen for a decubitus ulcer on her right gluteal area. She is known to have diabetes mellitus type 1 and her most recent A1c was 7.2% Past medical history significant for leukopenia, cholelithiasis, hypertension, chronic diastolic CHF, coronary artery disease,type 1 diabetes mellitus, rheumatoid arthritis, collagen vascular disease, ovarian cancer, status post CABG in 2014 and status post Port-A-Cath insertion by Dr. Leotis Pain in October 2016. She has quit smoking about 20 years ago. She was advised to use Medihoney with calcium alginate pads to be applied over the wound. 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 01/07/2016 -- x-ray of the left heel done -- IMPRESSION: No radiographic evidence of osteomyelitis. If this remains a clinical concern, recommend MR imaging. 02/05/16: returns today for f/u. denies fever, chills, body aches or malaise. no interval changes regarding health status. 02/12/16: pt is doing well. wound has improved. no s/s of infection. no systemic s/s of infection. She reports will be on vacation next week, and requests appointment in 2 weeks. 03/18/2016 - is going to be starting her chemotherapy this coming Monday and will have cycles every 3 weeks 04-01-16 [name], accompanied by her husband, presents for evaluation of her left posterior heel stage III pressure ulcer. She states that she started chemotherapy last week. Her chemotherapy is on a cycle of one week on 3 weeks off, with her next treatment  scheduled for December 11. There has been an increase [name]. ([id number]) in drainage since her last appointment. She denies any pain. They have been  using PolyMem silver rope. 04/22/16 - [name] presents today, accompanied by her husband, for a violation of her left posterior low heel pressure ulcer. She continues to receive chemotherapy, with the exception of last week due to neutropenia. Her next scheduled chemotherapy is on January 2. She states that her chemotherapy schedule is 1 day on 8 days off. She and her husband deny any overt changes to her wound, denies pain, denies any increased drainage. Electronic Signature(s) Signed: 04/22/2016 10:50:55 AM By: Rene Kocher, NP, Jana Hakim By: Rene Kocher, NP, Aaisha Sliter on 04/22/2016 10:50:55 [name], [name] ([id number]) -------------------------------------------------------------------------------- Physical Exam Details Patient Name: [name] 04/22/2016 8:45 Date of Service: AM Medical Record [id number] Number: Patient Account Number: [id number] 14-Feb-1946 (70 y.o. Treating RN: Cornell Barman Date of Birth/Sex: Female) Other Clinician: Primary Care Physician: Caryl Bis, ERIC Treating Jaskarn Schweer Referring Physician: Caryl Bis, ERIC Physician/Extender: Suella Grove in Treatment: 16 Constitutional BP within normal limits. afebrile. well nourished; well developed; appears stated age;Marland Kitchen Respiratory non-labored respiratory effort. Cardiovascular warm extremities; no edema present; cap refill less than or equal to 3 seconds. Musculoskeletal ambulated without assistance; steady gait. Integumentary (Hair, Skin) no periwound erythema, no tissue necrosis, no periwound maceration. This wound has essentially been unchanged since the start of her chemotherapy in early November. no induration, no fluctuance, denies pain. Psychiatric appears to make sound judgement and have accurate insight regarding healthcare. oriented to time,  place, person and situation. calm, pleasant, conversive. Electronic Signature(s) Signed: 04/22/2016 10:52:31 AM By: Rene Kocher, NP, Jana Hakim By: Rene Kocher, NP, Dejanae Helser on 04/22/2016 10:52:31 [name], [name] ([id number]) -------------------------------------------------------------------------------- Physician Orders Details Patient Name: VINNIA, JESSON 04/22/2016 8:45 Date of Service: AM Medical Record [id number] Number: Patient Account Number: [id number] Dec 27, 1945 (70 y.o. Treating RN: Cornell Barman Date of Birth/Sex: Female) Other Clinician: Primary Care Physician: Caryl Bis, ERIC Treating Jovanni Rash Referring Physician: Caryl Bis, ERIC Physician/Extender: Suella Grove in Treatment: 16 Verbal / Phone Orders: Yes Clinician: Cornell Barman Read Back and Verified: Yes Diagnosis Coding Wound Cleansing Wound #2 Left Calcaneus o Cleanse wound with mild soap and water o May Shower, gently pat wound dry prior to applying new dressing. Anesthetic Wound #2 Left Calcaneus o Topical Lidocaine 4% cream applied to wound bed prior to debridement - In clinic only Skin Barriers/Peri-Wound Care Wound #2 Left Calcaneus o Skin Prep Primary Wound Dressing Wound #2 Left Calcaneus o Other: - polymem ag Secondary Dressing Wound #2 Left Calcaneus o Boardered Foam Dressing Dressing Change Frequency Wound #2 Left Calcaneus o Change dressing every other day. Follow-up Appointments Wound #2 Left Calcaneus o Return Appointment in 1 week. Off-Loading Wound #2 Left Calcaneus o Heel suspension boot to: - Elevate on pillows [name], [name]. ([id number]) Additional Orders / Instructions Wound #2 Left Calcaneus o Increase protein intake. o Activity as tolerated o Other: - Include Vitamin A, C, Zinc, MVI in diet Electronic Signature(s) Signed: 04/22/2016 10:52:43 AM By: Rene Kocher, NP, Hiroshi Krummel Previous Signature: 04/22/2016 10:52:44 AM Version By: Gretta Cool, RN, BSN, Kim RN, BSN Entered  By: Rene Kocher, NP, Carrin Vannostrand on 04/22/2016 10:52:43 [name], [name] ([id number]) -------------------------------------------------------------------------------- Problem List Details Patient Name: LESTINE, WORTMANN 04/22/2016 8:45 Date of Service: AM Medical Record [id number] Number: Patient Account Number: [id number] Sep 14, 1945 (70 y.o. Treating RN: Cornell Barman Date of Birth/Sex: Female) Other Clinician: Primary Care Physician: Caryl Bis, ERIC Treating Lawanda Cousins Referring Physician: Caryl Bis, ERIC Physician/Extender: Suella Grove in Treatment: 16 Active Problems ICD-10 Encounter Code Description Active Date Diagnosis E10.621 Type 1 diabetes mellitus with foot ulcer 12/31/2015 Yes L89.623 Pressure ulcer of left heel, stage  3 12/31/2015 Yes Z92.21 Personal history of antineoplastic chemotherapy 12/31/2015 Yes Z79.52 Long term (current) use of systemic steroids 12/31/2015 Yes Inactive Problems Resolved Problems Electronic Signature(s) Signed: 04/22/2016 10:48:55 AM By: Rene Kocher, NP, Nadalee Neiswender Entered By: Rene Kocher, NP, Elverta Dimiceli on 04/22/2016 10:48:55 [name] ([id number]) -------------------------------------------------------------------------------- Progress Note Details Patient Name: STEPHINIE, JOLLIE 04/22/2016 8:45 Date of Service: AM Medical Record [id number] Number: Patient Account Number: [id number] [birth year] (70 y.o. Treating RN: Cornell Barman Date of Birth/Sex: Female) Other Clinician: Primary Care Physician: Caryl Bis, ERIC Treating Lawanda Cousins Referring Physician: Caryl Bis, ERIC Physician/Extender: Suella Grove in Treatment: 16 Subjective Chief Complaint Information obtained from Patient Mrs. Goenner presents for evaluation of her left posterior heel pressure ulcer History of Present Illness (HPI) The following HPI elements were documented for the patient's wound: Location: right heel Quality: Patient reports experiencing a dull pain to affected area(s). Severity: Patient  states wound are getting worse. Duration: Patient has had the wound for Timing: Pain in wound is Intermittent (comes and goes Context: The wound appeared gradually over time Modifying Factors: Consults to this date include: local care was done as per the medication given by the PCP which may have been medihoney Associated Signs and Symptoms: Patient reports having difficulty standing for long periods. 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 In the past she was seen for a decubitus ulcer on her right gluteal area. She is known to have diabetes mellitus type 1 and her most recent A1c was 7.2% Past medical history significant for leukopenia, cholelithiasis, hypertension, chronic diastolic CHF, coronary artery disease,type 1 diabetes mellitus, rheumatoid arthritis, collagen vascular disease, ovarian cancer, status post CABG in 2014 and status post Port-A-Cath insertion by Dr. Leotis Pain in October 2016. She has quit smoking about 20 years ago. She was advised to use Medihoney with calcium alginate pads to be applied over the wound. 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 01/07/2016 -- x-ray of the left heel done -- IMPRESSION: No radiographic evidence of osteomyelitis. If this remains a clinical concern, recommend MR imaging. 02/05/16: returns today for f/u. denies fever, chills, body aches or malaise. no interval changes regarding health status. 02/12/16: pt is doing well. wound has improved. no s/s of infection.  no systemic s/s of infection. She reports [name], [name]. ([id number]) will be on vacation next week, and requests appointment in 2 weeks. 03/18/2016 - is going to be starting her chemotherapy this coming Monday and will have cycles every 3 weeks 04-01-16 [name], accompanied by her husband, presents for evaluation of her left posterior heel stage III pressure ulcer. She states that she started chemotherapy last week. Her chemotherapy is on a cycle of one week on 3 weeks off, with her next treatment scheduled for December 11. There has been an increase in drainage since her last appointment. She denies any pain. They have been using PolyMem silver rope. 04/22/16 - [name] presents today, accompanied by her husband, for a violation of her left posterior low heel pressure ulcer. She continues to receive chemotherapy, with the  exception of last week due to neutropenia. Her next scheduled chemotherapy is on January 2. She states that her chemotherapy schedule is 1 day on 8 days off. She and her husband deny any overt changes to her wound, denies pain, denies any increased drainage. Objective Constitutional BP within normal limits. afebrile. well nourished; well developed; appears stated age;Marland Kitchen Vitals Time Taken: 8:52 AM, Height: 63 in, Weight: 130 lbs, BMI: 23, Temperature: 98.0 F, Pulse: 79 bpm, Respiratory Rate: 16 breaths/min, Blood Pressure: 122/41 mmHg. Respiratory non-labored respiratory effort. Cardiovascular warm extremities; no edema present; cap refill less than or equal to 3 seconds. Musculoskeletal ambulated without assistance; steady gait. Psychiatric appears to make sound judgement and have accurate insight regarding healthcare. oriented to time, place, person and situation. calm, pleasant, conversive. Integumentary (Hair, Skin) no periwound erythema, no tissue necrosis, no periwound maceration. This wound has essentially been unchanged since the start of her  chemotherapy in early November. no induration, no fluctuance, denies pain. Wound #2 status is Open. Original cause of wound was Pressure Injury. The wound is located on the Left [name], [name]. ([id number]) Calcaneus. The wound measures 0.5cm length x 0.5cm width x 0.6cm depth; 0.196cm^2 area and 0.118cm^3 volume. There is fat exposed. There is no tunneling noted, however, there is undermining starting at 5:00 and ending at 11:00 with a maximum distance of 1.1cm. There is a medium amount of sanguinous drainage noted. The wound margin is flat and intact. There is no granulation within the wound bed. There is a medium (34-66%) amount of necrotic tissue within the wound bed including Adherent Slough. The periwound skin appearance exhibited: Induration, Moist. The periwound skin appearance did not exhibit: Callus, Crepitus, Excoriation, Fluctuance, Friable, Localized Edema, Rash, Scarring, Dry/Scaly, Maceration, Atrophie Blanche, Cyanosis, Ecchymosis, Hemosiderin Staining, Mottled, Pallor, Rubor, Erythema. Periwound temperature was noted as No Abnormality. The periwound has tenderness on palpation. Assessment Active Problems ICD-10 E10.621 - Type 1 diabetes mellitus with foot ulcer L89.623 - Pressure ulcer of left heel, stage 3 Z92.21 - Personal history of antineoplastic chemotherapy Z79.52 - Long term (current) use of systemic steroids I think at this stage, in light of her current chemotherapy treatment, no regression and no infection are an adequate expectation. This was discussed with the patient and her husband and they both verbalized understanding. Plan Wound Cleansing: Wound #2 Left Calcaneus: Cleanse wound with mild soap and water May Shower, gently pat wound dry prior to applying new dressing. Anesthetic: Wound #2 Left Calcaneus: Topical Lidocaine 4% cream applied to wound bed prior to debridement - In clinic only Skin Barriers/Peri-Wound Care: Wound #2 Left Calcaneus: Skin  Prep Primary Wound Dressing: Wound #2 Left Calcaneus: Other: - polymem ag Secondary Dressing: [name], REYNEN ([id number]) Wound #2 Left Calcaneus: Boardered Foam Dressing Dressing Change Frequency: Wound #2 Left Calcaneus: Change dressing every other day. Follow-up Appointments: Wound #2 Left Calcaneus: Return Appointment in 1 week. Off-Loading: Wound #2 Left Calcaneus: Heel suspension boot to: - Elevate on pillows Additional Orders / Instructions: Wound #2 Left Calcaneus: Increase protein intake. Activity as tolerated Other: - Include Vitamin A, C, Zinc, MVI in diet Follow-Up Appointments: A Patient Clinical Summary of Care was provided to SR 1. Continue with PolyMem 2. Follow-up next week 3. Increase protein intake Electronic Signature(s) Signed: 04/22/2016 10:54:37 AM By: Rene Kocher, NP, Sharae Zappulla Entered By: Rene Kocher, NP, Allyne Hebert on 04/22/2016 10:54:37 Bodner, [name] ([id number]) -------------------------------------------------------------------------------- SuperBill Details Patient Name: [name] Date of Service: 04/22/2016 Medical Record Number: [id number] Patient Account Number: [id number] Date  of Birth/Sex: [birth year] (70 y.o. Female) Treating RN: Cornell Barman Primary Care Physician: Caryl Bis, ERIC Other Clinician: Referring Physician: Caryl Bis, ERIC Treating Physician/Extender: Cathie Olden in Treatment: 16 Diagnosis Coding ICD-10 Codes Code Description E10.621 Type 1 diabetes mellitus with foot ulcer L89.623 Pressure ulcer of left heel, stage 3 Z92.21 Personal history of antineoplastic chemotherapy Z79.52 Long term (current) use of systemic steroids Facility Procedures CPT4 Code: [id number] Description: 99213 - WOUND CARE VISIT-LEV 3 EST PT Modifier: Quantity: 1 Physician Procedures CPT4 Code: [id number] Description: [id number] - WC PHYS LEVEL 3 - EST PT ICD-10 Description Diagnosis E10.621 Type 1 diabetes mellitus with foot ulcer L89.623 Pressure  ulcer of left heel, stage 3 Modifier: Quantity: 1 Electronic Signature(s) Signed: 04/22/2016 10:54:50 AM By: Rene Kocher, NP, Scherry Laverne Entered By: Rene Kocher, NP, Cesily Cuoco on 04/22/2016 10:54:49

## 2016-04-26 ENCOUNTER — Other Ambulatory Visit: Payer: Self-pay

## 2016-04-26 ENCOUNTER — Inpatient Hospital Stay: Payer: Medicare Other

## 2016-04-26 ENCOUNTER — Encounter: Payer: Self-pay | Admitting: Family Medicine

## 2016-04-26 ENCOUNTER — Ambulatory Visit (INDEPENDENT_AMBULATORY_CARE_PROVIDER_SITE_OTHER): Payer: Medicare Other | Admitting: Family Medicine

## 2016-04-26 DIAGNOSIS — L97421 Non-pressure chronic ulcer of left heel and midfoot limited to breakdown of skin: Secondary | ICD-10-CM | POA: Diagnosis not present

## 2016-04-26 DIAGNOSIS — K219 Gastro-esophageal reflux disease without esophagitis: Secondary | ICD-10-CM | POA: Diagnosis not present

## 2016-04-26 DIAGNOSIS — C801 Malignant (primary) neoplasm, unspecified: Secondary | ICD-10-CM

## 2016-04-26 DIAGNOSIS — E1059 Type 1 diabetes mellitus with other circulatory complications: Secondary | ICD-10-CM

## 2016-04-26 DIAGNOSIS — C569 Malignant neoplasm of unspecified ovary: Secondary | ICD-10-CM

## 2016-04-26 DIAGNOSIS — I82C11 Acute embolism and thrombosis of right internal jugular vein: Secondary | ICD-10-CM

## 2016-04-26 DIAGNOSIS — C786 Secondary malignant neoplasm of retroperitoneum and peritoneum: Secondary | ICD-10-CM

## 2016-04-26 LAB — CBC WITH DIFFERENTIAL/PLATELET
Basophils Absolute: 0 10*3/uL (ref 0–0.1)
Basophils Relative: 1 %
Eosinophils Absolute: 0.1 10*3/uL (ref 0–0.7)
Eosinophils Relative: 2 %
HCT: 28.6 % — ABNORMAL LOW (ref 35.0–47.0)
Hemoglobin: 9.8 g/dL — ABNORMAL LOW (ref 12.0–16.0)
Lymphocytes Relative: 20 %
Lymphs Abs: 0.7 10*3/uL — ABNORMAL LOW (ref 1.0–3.6)
MCH: 30.5 pg (ref 26.0–34.0)
MCHC: 34.2 g/dL (ref 32.0–36.0)
MCV: 89.3 fL (ref 80.0–100.0)
Monocytes Absolute: 0.5 10*3/uL (ref 0.2–0.9)
Monocytes Relative: 13 %
Neutro Abs: 2.2 10*3/uL (ref 1.4–6.5)
Neutrophils Relative %: 64 %
Platelets: 196 10*3/uL (ref 150–440)
RBC: 3.2 MIL/uL — ABNORMAL LOW (ref 3.80–5.20)
RDW: 17.4 % — ABNORMAL HIGH (ref 11.5–14.5)
WBC: 3.5 10*3/uL — ABNORMAL LOW (ref 3.6–11.0)

## 2016-04-26 NOTE — Patient Instructions (Signed)
Nice to see you. Please keep track of your blood sugars and if you have lows or highs please contact your cardiologist. Please continue to monitor the sore on her left heel. If this worsens, has increasing pain, redness, drainage please seek medical attention immediately. Please continue to follow with oncology. If you notice any abdominal pain or blood in your stool please let us know.

## 2016-04-26 NOTE — Assessment & Plan Note (Signed)
Doing well overall. Currently on chemotherapy and followed by oncology. Encouraged her to keep continued follow-up with oncology.

## 2016-04-26 NOTE — Assessment & Plan Note (Signed)
Slightly worsened A1c recently though reports she had received a fair number of steroids leading up to the A1c. Currently on insulin pump and followed by endocrinology. Encouraged her to continue to monitor her sugars and if they can become low or high consistently she should contact endocrinology.

## 2016-04-26 NOTE — Assessment & Plan Note (Signed)
Well-controlled on Zantac and Protonix. No changes made today.

## 2016-04-26 NOTE — Assessment & Plan Note (Signed)
Stable. No signs of infection. Continue to follow with wound care.

## 2016-04-26 NOTE — Progress Notes (Signed)
Pre visit review using our clinic review tool, if applicable. No additional management support is needed unless otherwise documented below in the visit note. 

## 2016-04-26 NOTE — Progress Notes (Signed)
  Tommi Rumps, MD Phone: [phone]  [name] is a 70 y.o. female who presents today for follow-up.  Ovarian cancer: Recently started back on chemotherapy. Notes she has been tolerating this fairly well other than that her white blood cell count was low. She's been getting shots to help increase her white blood cell count. She notes her abdominal discomfort has improved significantly since starting on chemotherapy.  Type 1 diabetes: She is on an insulin pump. Saw endocrinology last month and they made adjustments to her basal rate. She notes rare hypoglycemic episodes with sugars in the upper 60s and in 70s. She'll drink some OJ and this resolves on its own. Notes her sugars typically range between less than 120 fasting up to the upper 100s around lunchtime. Last A1c was 7.6. No polyuria or polydipsia.  GERD: No reflux symptoms. No abdominal pain. No blood in her stool. Currently taking Zantac and Protonix.  Patient is followed by the wound clinic for a heel ulceration. Notes it is not worsening or improving. Does not appear infected. No redness. Minimal drainage. They have medicated gauze to put in it.    PMH: Former smoker   ROS see history of present illness  Objective  Physical Exam Vitals:   04/26/16 0912  BP: (!) 106/54  Pulse: 82  Temp: 97.8 F (36.6 C)    BP Readings from Last 3 Encounters:  04/26/16 (!) 106/54  04/18/16 112/66  04/12/16 (!) 120/50   Wt Readings from Last 3 Encounters:  04/26/16 141 lb 3.2 oz (64 kg)  04/18/16 138 lb 10.7 oz (62.9 kg)  04/12/16 140 lb 12 oz (63.8 kg)    Physical Exam  Constitutional: No distress.  Cardiovascular: Normal rate, regular rhythm and normal heart sounds.   Pulmonary/Chest: Effort normal and breath sounds normal.  Abdominal: Soft. She exhibits no distension. There is no tenderness. There is no rebound and no guarding.  Ostomy bag in place with brown stool  Musculoskeletal: She exhibits no edema.    Neurological: She is alert.  Skin: Skin is warm and dry. She is not diaphoretic.  Left lateral posterior heel with a half centimeter diameter ulceration with gauze in place, there is no drainage, no erythema, no tenderness     Assessment/Plan: Please see individual problem list.  GERD (gastroesophageal reflux disease) Well-controlled on Zantac and Protonix. No changes made today.  Diabetes type 1, controlled ([place]) Slightly worsened A1c recently though reports she had received a fair number of steroids leading up to the A1c. Currently on insulin pump and followed by endocrinology. Encouraged her to continue to monitor her sugars and if they can become low or high consistently she should contact endocrinology.  Heel ulcer ([place]) Stable. No signs of infection. Continue to follow with wound care.  Ovarian cancer ([place]) Doing well overall. Currently on chemotherapy and followed by oncology. Encouraged her to keep continued follow-up with oncology.   Tommi Rumps, MD [place]

## 2016-04-27 ENCOUNTER — Other Ambulatory Visit: Payer: Self-pay | Admitting: Family Medicine

## 2016-04-27 ENCOUNTER — Inpatient Hospital Stay: Payer: Medicare Other

## 2016-04-29 ENCOUNTER — Encounter: Payer: Medicare Other | Admitting: Surgery

## 2016-04-29 DIAGNOSIS — E10621 Type 1 diabetes mellitus with foot ulcer: Secondary | ICD-10-CM | POA: Diagnosis not present

## 2016-04-30 NOTE — Progress Notes (Signed)
[name] [name] [name], RAHAIM ([id number]) Visit Report for 04/29/2016 Chief Complaint Document Details Patient Name: [name], [name] [name] 04/29/2016 8:15 Date of Service: AM Medical Record [id number] Number: Patient Account Number: [id number] [birth year] (70 y.o. Treating RN: [name] Gouty, RN, BSN, [name] [name] [name] Date of Birth/Sex: Female) Other Clinician: Primary Care Physician: [name] [name] [name], [name] Treating [name] [name] [name] Referring Physician: Caryl [name], [name] Physician/Extender: Suella Grove in Treatment: 17 Information Obtained from: Patient Chief Complaint [name] [name] [name] presents for evaluation of her left posterior heel pressure ulcer Electronic Signature(s) Signed: 04/29/2016 8:36:10 AM By: [name] Fudge MD, FACS Entered By: [name] [name] [name] on 04/29/2016 08:36:10 [name], [name] [name] ([id number]) -------------------------------------------------------------------------------- HPI Details Patient Name: [name], [name] [name] 04/29/2016 8:15 Date of Service: AM Medical Record [id number] Number: Patient Account Number: [id number] 05-17-1945 (70 y.o. Treating RN: Afful, RN, BSN, [name] [name] [name] Date of Birth/Sex: Female) Other Clinician: Primary Care Physician: [name] [name] [name], [name] Treating [name] [name] [name] Referring Physician: Caryl [name], [name] Physician/Extender: Weeks in Treatment: 17 History of Present Illness Location: right heel Quality: Patient reports experiencing a dull pain to affected area(s). Severity: Patient states wound are getting worse. Duration: Patient has had the wound for <5 months prior to presenting for treatment Timing: Pain in wound is Intermittent (comes and goes Context: The wound appeared gradually over time Modifying Factors: Consults to this date include: local care was done as per the medication given by the PCP which may have been medihoney Associated Signs and Symptoms: Patient reports having difficulty standing for long periods. 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 In the past she was seen for a decubitus ulcer on her right gluteal area. She is known to have diabetes mellitus type 1 and her most recent A1c was 7.2% Past medical history significant for leukopenia, cholelithiasis, hypertension, chronic diastolic CHF, coronary artery disease,type 1 diabetes mellitus, rheumatoid arthritis, collagen vascular disease, ovarian cancer, status post CABG in 2014 and status post Port-A-Cath insertion by Dr. Leotis Pain in October 2016. She has quit smoking about 20 years ago. She was advised to use Medihoney with calcium alginate pads to be applied over the wound. 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 01/07/2016 -- x-ray of the left heel done -- IMPRESSION: No radiographic evidence of osteomyelitis. If this remains a clinical concern, recommend MR imaging. 02/05/16: returns today for f/u. denies fever, chills, body aches or malaise. no interval changes regarding health status. 02/12/16: pt is doing well. wound has improved. no s/s of infection. no systemic s/s of infection. She reports will be on vacation next week, and requests appointment in 2 weeks. 03/18/2016 - is going to be starting her chemotherapy this coming Monday and will have cycles every 3 weeks 04-01-16 [name] [name] [name], accompanied by her husband, presents for evaluation of her left posterior heel stage III pressure ulcer. She states that she started chemotherapy last week. Her chemotherapy is on a cycle of one week on 3 weeks off, with her next  treatment scheduled for December 11. There has been an increase [name], [name] [name]. ([id number]) in drainage since her last appointment. She denies any  pain. They have been using PolyMem silver rope. 04/22/16 - [name] [name] [name] presents today, accompanied by her husband, for a violation of her left posterior low heel pressure ulcer. She continues to receive chemotherapy, with the exception of last week due to neutropenia. Her next scheduled chemotherapy is on January 2. She states that her chemotherapy schedule is 1 day on 8 days off. She and her husband deny any overt changes to her wound, denies pain, denies any increased drainage. 04/29/2016 -- the copayment for the skin substitute is about $300 a week and they have declined use of this material. Electronic Signature(s) Signed: 04/29/2016 8:37:19 AM By: [name] Fudge MD, FACS Previous Signature: 04/29/2016 8:36:18 AM Version By: [name] Fudge MD, FACS Entered By: [name] [name] [name] on 04/29/2016 08:37:19 [name] [name] [name] ([id number]) -------------------------------------------------------------------------------- Physical Exam Details Patient Name: Janet, [name] 04/29/2016 8:15 Date of Service: AM Medical Record [id number] Number: Patient Account Number: [id number] [birth year] (71 y.o. Treating RN: [name] Gouty, RN, BSN, [name] [name] [name] Date of Birth/Sex: Female) Other Clinician: Primary Care Physician: [name] [name] [name], [name] Treating Laterrian Hevener Referring Physician: Caryl [name], [name] Physician/Extender: Weeks in Treatment: 17 Constitutional . Pulse regular. Respirations normal and unlabored. Afebrile. . Eyes Nonicteric. Reactive to light. Ears, Nose, Mouth, and Throat Lips, teeth, and gums WNL.Marland Kitchen Moist mucosa without lesions. Neck supple and nontender. No palpable supraclavicular or cervical adenopathy. Normal sized without goiter. Respiratory WNL. No retractions.. Breath sounds WNL, No rubs, rales, rhonchi, or wheeze.. Cardiovascular Heart rhythm and rate  regular, no murmur or gallop.. Pedal Pulses WNL. No clubbing, cyanosis or edema. Chest Breasts symmetical and no nipple discharge.. Breast tissue WNL, no masses, lumps, or tenderness.. Lymphatic No adneopathy. No adenopathy. No adenopathy. Musculoskeletal Adexa without tenderness or enlargement.. Digits and nails w/o clubbing, cyanosis, infection, petechiae, ischemia, or inflammatory conditions.. Integumentary (Hair, Skin) No suspicious lesions. No crepitus or fluctuance. No peri-wound warmth or erythema. No masses.Marland Kitchen Psychiatric Judgement and insight Intact.. No evidence of depression, anxiety, or agitation.. Notes the wound is clean and the depth is coming minimally and there is no surrounding cellulitis. Electronic Signature(s) Signed: 04/29/2016 8:37:50 AM By: [name] Fudge MD, FACS Previous Signature: 04/29/2016 8:36:42 AM Version By: [name] Fudge MD, FACS Entered By: [name] [name] [name] on 04/29/2016 08:37:50 [name], [name] [name] ([id number]) -------------------------------------------------------------------------------- Physician Orders Details Patient Name: [name], [name] [name] 04/29/2016 8:15 Date of Service: AM Medical Record [id number] Number: Patient Account Number: [id number] 04-11-1946 (70 y.o. Treating RN: Afful, RN, BSN, [name] [name] [name] Date of Birth/Sex: Female) Other Clinician: Primary Care Physician: [name] [name] [name], [name] Treating Shakeema Lippman Referring Physician: Caryl [name], [name] Physician/Extender: Suella Grove in Treatment: 37 Verbal / Phone Orders: Yes Clinician: Afful, RN, BSN, Rita Read Back and Verified: Yes Diagnosis Coding Wound Cleansing Wound #2 Left Calcaneus o Cleanse wound with mild soap and water o May Shower, gently pat wound dry prior to applying new dressing. Anesthetic Wound #2 Left Calcaneus o Topical Lidocaine 4% cream applied to wound bed prior to debridement - In clinic only Skin Barriers/Peri-Wound Care Wound #2 Left Calcaneus o Skin Prep Primary Wound  Dressing Wound #2 Left Calcaneus o Other: - polymem ag Secondary Dressing Wound #2 Left Calcaneus o Boardered Foam Dressing Dressing Change Frequency Wound #2 Left Calcaneus o Change dressing every other day. Follow-up Appointments Wound #2 Left Calcaneus o Return Appointment in 1 week. Off-Loading Wound #2 Left Calcaneus o Heel suspension boot to: - Elevate on pillows [name], [name] [name]. ([id number]) Additional Orders / Instructions Wound #2 Left Calcaneus o Increase protein intake. o Activity as tolerated o Other: - Include Vitamin  A, C, Zinc, MVI in diet Electronic Signature(s) Signed: 04/29/2016 4:27:48 PM By: [name] Fudge MD, FACS Signed: 04/29/2016 5:28:47 PM By: Regan Lemming BSN, RN Entered By: Regan Lemming on 04/29/2016 08:25:24 [name] [name] [name] ([id number]) -------------------------------------------------------------------------------- Problem List Details Patient Name: [name], [name] [name] 04/29/2016 8:15 Date of Service: AM Medical Record [id number] Number: Patient Account Number: [id number] Sep 04, 1945 (70 y.o. Treating RN: [name] Gouty, RN, BSN, [name] [name] [name] Date of Birth/Sex: Female) Other Clinician: Primary Care Physician: [name] [name] [name], [name] Treating [name] [name] [name] Referring Physician: Caryl [name], [name] Physician/Extender: Weeks in Treatment: 17 Active Problems ICD-10 Encounter Code Description Active Date Diagnosis E10.621 Type 1 diabetes mellitus with foot ulcer 12/31/2015 Yes L89.623 Pressure ulcer of left heel, stage 3 12/31/2015 Yes Z92.21 Personal history of antineoplastic chemotherapy 12/31/2015 Yes Z79.52 Long term (current) use of systemic steroids 12/31/2015 Yes Inactive Problems Resolved Problems Electronic Signature(s) Signed: 04/29/2016 8:35:42 AM By: [name] Fudge MD, FACS Entered By: [name] [name] [name] on 04/29/2016 08:35:42 [name] [name] [name] ([id number]) -------------------------------------------------------------------------------- Progress Note  Details Patient Name: [name], [name] [name] 04/29/2016 8:15 Date of Service: AM Medical Record [id number] Number: Patient Account Number: [id number] [birth year] (69 y.o. Treating RN: [name] Gouty, RN, BSN, [name] [name] [name] Date of Birth/Sex: Female) Other Clinician: Primary Care Physician: [name] [name] [name], [name] Treating Julizza Sassone Referring Physician: Caryl [name], [name] Physician/Extender: Suella Grove in Treatment: 17 Subjective Chief Complaint Information obtained from Patient Mrs. Saturday presents for evaluation of her left posterior heel pressure ulcer History of Present Illness (HPI) The following HPI elements were documented for the patient's wound: Location: right heel Quality: Patient reports experiencing a dull pain to affected area(s). Severity: Patient states wound are getting worse. Duration: Patient has had the wound for Timing: Pain in wound is Intermittent (comes and goes Context: The wound appeared gradually over time Modifying Factors: Consults to this date include: local care was done as per the medication given by the PCP which may have been medihoney Associated Signs and Symptoms: Patient reports having difficulty standing for long periods. 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 In the past she was seen for a decubitus ulcer on her right gluteal area. She is known to have diabetes mellitus type 1 and her most recent A1c was 7.2% Past medical history significant for leukopenia, cholelithiasis, hypertension, chronic diastolic CHF, coronary artery disease,type 1 diabetes mellitus, rheumatoid arthritis, collagen vascular disease, ovarian cancer, status post CABG in 2014 and status post Port-A-Cath insertion by Dr. Leotis Pain in October 2016. She has quit smoking about 20 years ago. She was advised to use Medihoney with  calcium alginate pads to be applied over the wound. 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 01/07/2016 -- x-ray of the left heel done -- IMPRESSION: No radiographic evidence of osteomyelitis. If this remains a clinical concern, recommend MR imaging. 02/05/16: returns today for f/u. denies fever, chills, body aches or malaise. no interval changes regarding health status. 02/12/16: pt is doing well. wound has improved. no s/s of infection. no systemic s/s of infection. She reports [name], [name] [name]. ([id number]) will be on vacation next week, and requests appointment  in 2 weeks. 03/18/2016 - is going to be starting her chemotherapy this coming Monday and will have cycles every 3 weeks 04-01-16 Mrs. Wohl, accompanied by her husband, presents for evaluation of her left posterior heel stage III pressure ulcer. She states that she started chemotherapy last week. Her chemotherapy is on a cycle of one week on 3 weeks off, with her next treatment scheduled for December 11. There has been an increase in drainage since her last appointment. She denies any pain. They have been using PolyMem silver rope. 04/22/16 - Mrs. Pone presents today, accompanied by her husband, for a violation of her left posterior low heel pressure ulcer. She continues to receive chemotherapy, with the exception of last week due to neutropenia. Her next scheduled chemotherapy is on January 2. She states that her chemotherapy schedule is 1 day on 8 days off. She and her husband deny any overt changes to her wound, denies pain, denies any increased drainage. 04/29/2016 -- the copayment for the skin substitute is about $300 a week  and they have declined use of this material. Objective Constitutional Pulse regular. Respirations normal and unlabored. Afebrile. Vitals Time Taken: 8:08 AM, Height: 63 in, Weight: 130 lbs, BMI: 23, Temperature: 98.0 F, Pulse: 81 bpm, Respiratory Rate: 17 breaths/min, Blood Pressure: 134/73 mmHg. Eyes Nonicteric. Reactive to light. Ears, Nose, Mouth, and Throat Lips, teeth, and gums WNL.Marland Kitchen Moist mucosa without lesions. Neck supple and nontender. No palpable supraclavicular or cervical adenopathy. Normal sized without goiter. Respiratory WNL. No retractions.. Breath sounds WNL, No rubs, rales, rhonchi, or wheeze.. Cardiovascular Heart rhythm and rate regular, no murmur or gallop.. Pedal Pulses WNL. No clubbing, cyanosis or edema. Chest Breasts symmetical and no nipple discharge.. Breast tissue WNL, no masses, lumps, or tenderness.. Lymphatic ADRINNE, CUELLAR ([id number]) No adneopathy. No adenopathy. No adenopathy. Musculoskeletal Adexa without tenderness or enlargement.. Digits and nails w/o clubbing, cyanosis, infection, petechiae, ischemia, or inflammatory conditions.Marland Kitchen Psychiatric Judgement and insight Intact.. No evidence of depression, anxiety, or agitation.. General Notes: the wound is clean and the depth is coming minimally and there is no surrounding cellulitis. Integumentary (Hair, Skin) No suspicious lesions. No crepitus or fluctuance. No peri-wound warmth or erythema. No masses.. Wound #2 status is Open. Original cause of wound was Pressure Injury. The wound is located on the Left Calcaneus. The wound measures 0.5cm length x 0.5cm width x 0.8cm depth; 0.196cm^2 area and 0.157cm^3 volume. There is fat exposed. There is no tunneling or undermining noted. There is a medium amount of sanguinous drainage noted. The wound margin is flat and intact. There is medium (34-66%) pink, pale granulation within the wound bed. There is a medium (34-66%) amount of necrotic tissue within  the wound bed including Adherent Slough. The periwound skin appearance exhibited: Induration, Moist. The periwound skin appearance did not exhibit: Callus, Crepitus, Excoriation, Fluctuance, Friable, Localized Edema, Rash, Scarring, Dry/Scaly, Maceration, Atrophie Blanche, Cyanosis, Ecchymosis, Hemosiderin Staining, Mottled, Pallor, Rubor, Erythema. Periwound temperature was noted as No Abnormality. The periwound has tenderness on palpation. Assessment Active Problems ICD-10 E10.621 - Type 1 diabetes mellitus with foot ulcer L89.623 - Pressure ulcer of left heel, stage 3 Z92.21 - Personal history of antineoplastic chemotherapy Z79.52 - Long term (current) use of systemic steroids Plan Wound Cleansing: Wound #2 Left Calcaneus: Cleanse wound with mild soap and water May Shower, gently pat wound dry prior to applying new dressing. JHOSELIN, CRAYCRAFT ([id number]) Anesthetic: Wound #2 Left Calcaneus: Topical Lidocaine 4% cream applied to wound bed prior to debridement -  In clinic only Skin Barriers/Peri-Wound Care: Wound #2 Left Calcaneus: Skin Prep Primary Wound Dressing: Wound #2 Left Calcaneus: Other: - polymem ag Secondary Dressing: Wound #2 Left Calcaneus: Boardered Foam Dressing Dressing Change Frequency: Wound #2 Left Calcaneus: Change dressing every other day. Follow-up Appointments: Wound #2 Left Calcaneus: Return Appointment in 1 week. Off-Loading: Wound #2 Left Calcaneus: Heel suspension boot to: - Elevate on pillows Additional Orders / Instructions: Wound #2 Left Calcaneus: Increase protein intake. Activity as tolerated Other: - Include Vitamin A, C, Zinc, MVI in diet Her copayment for the skin substitute has been expensive and they have declined this mode of therapy. I have recommended going back to packing the wound with PolyMem silver rope, and we will change this every other day. She will be seen back in a weeks' time. Electronic Signature(s) Signed:  04/29/2016 8:39:02 AM By: [name] Fudge MD, FACS Entered By: [name] [name] [name] on 04/29/2016 08:39:02 [name] [name] [name] ([id number]) -------------------------------------------------------------------------------- SuperBill Details Patient Name: [name] [name] [name] Date of Service: 04/29/2016 Medical Record Patient Account Number: [id number] Number: Afful, RN, BSN, Treating RN: May 09, 1945 (70 y.o. [name] [name] [name] Date of Birth/Sex: Female) Other Clinician: Primary Care Physician: [name] [name] [name], [name] Treating [name] [name] [name] Referring Physician: Caryl [name], [name] Physician/Extender: Suella Grove in Treatment: 17 Diagnosis Coding ICD-10 Codes Code Description E10.621 Type 1 diabetes mellitus with foot ulcer L89.623 Pressure ulcer of left heel, stage 3 Z92.21 Personal history of antineoplastic chemotherapy Z79.52 Long term (current) use of systemic steroids Facility Procedures CPT4 Code: [id number] Description: [id number] - WOUND CARE VISIT-LEV 2 EST PT Modifier: Quantity: 1 Physician Procedures CPT4 Code: [id number] Description: [id number] - WC PHYS LEVEL 3 - EST PT ICD-10 Description Diagnosis E10.621 Type 1 diabetes mellitus with foot ulcer L89.623 Pressure ulcer of left heel, stage 3 Z92.21 Personal history of antineoplastic chemothera Z79.52 Long term (current) use  of systemic steroids Modifier: py Quantity: 1 Electronic Signature(s) Signed: 04/29/2016 8:39:16 AM By: [name] Fudge MD, FACS Entered By: [name] [name] [name] on 04/29/2016 08:39:15

## 2016-04-30 NOTE — Progress Notes (Signed)
Janet, [name] ([id number]) Visit Report for 04/29/2016 Arrival Information Details Patient Name: [name], [name] Date of Service: 04/29/2016 8:15 AM Medical Record Number: [id number] Patient Account Number: [id number] Date of Birth/Sex: December 17, 1945 (70 y.o. Female) Treating RN: Afful, RN, BSN, Velva Harman Primary Care Physician: Caryl Bis, ERIC Other Clinician: Referring Physician: Caryl Bis, ERIC Treating Physician/Extender: Frann Rider in Treatment: 71 Visit Information History Since Last Visit All ordered tests and consults were completed: No Patient Arrived: Cane Added or deleted any medications: No Arrival Time: 08:08 Any new allergies or adverse reactions: No Accompanied By: hubby Had a fall or experienced change in No Transfer Assistance: None activities of daily living that may affect Patient Identification Verified: Yes risk of falls: Secondary Verification Process Yes Signs or symptoms of abuse/neglect since last No Completed: visito Patient Requires Transmission- No Hospitalized since last visit: No Based Precautions: Has Dressing in Place as Prescribed: Yes Patient Has Alerts: Yes Pain Present Now: No Patient Alerts: Patient on Blood Thinner eliquis BID DMII Electronic Signature(s) Signed: 04/29/2016 5:28:47 PM By: Regan Lemming BSN, RN Entered By: Regan Lemming on 04/29/2016 08:08:36 [name] ([id number]) -------------------------------------------------------------------------------- Clinic Level of Care Assessment Details Patient Name: [name] Date of Service: 04/29/2016 8:15 AM Medical Record Number: [id number] Patient Account Number: [id number] Date of Birth/Sex: 04/04/1946 (70 y.o. Female) Treating RN: Afful, RN, BSN, [place] Primary Care Physician: Caryl Bis, ERIC Other Clinician: Referring Physician: Caryl Bis, ERIC Treating Physician/Extender: Frann Rider in Treatment: 17 Clinic Level of Care Assessment Items TOOL 4  Quantity Score []  - Use when only an EandM is performed on FOLLOW-UP visit 0 ASSESSMENTS - Nursing Assessment / Reassessment X - Reassessment of Co-morbidities (includes updates in patient status) 1 10 X - Reassessment of Adherence to Treatment Plan 1 5 ASSESSMENTS - Wound and Skin Assessment / Reassessment X - Simple Wound Assessment / Reassessment - one wound 1 5 []  - Complex Wound Assessment / Reassessment - multiple wounds 0 []  - Dermatologic / Skin Assessment (not related to wound area) 0 ASSESSMENTS - Focused Assessment []  - Circumferential Edema Measurements - multi extremities 0 []  - Nutritional Assessment / Counseling / Intervention 0 X - Lower Extremity Assessment (monofilament, tuning fork, pulses) 1 5 []  - Peripheral Arterial Disease Assessment (using hand held doppler) 0 ASSESSMENTS - Ostomy and/or Continence Assessment and Care []  - Incontinence Assessment and Management 0 []  - Ostomy Care Assessment and Management (repouching, etc.) 0 PROCESS - Coordination of Care X - Simple Patient / Family Education for ongoing care 1 15 []  - Complex (extensive) Patient / Family Education for ongoing care 0 []  - Staff obtains Programmer, systems, Records, Test Results / Process Orders 0 []  - Staff telephones HHA, Nursing Homes / Clarify orders / etc 0 []  - Routine Transfer to another Facility (non-emergent condition) 0 TNIA, SZOSTEK. ([id number]) []  - Routine Hospital Admission (non-emergent condition) 0 []  - New Admissions / Biomedical engineer / Ordering NPWT, Apligraf, etc. 0 []  - Emergency Hospital Admission (emergent condition) 0 []  - Simple Discharge Coordination 0 []  - Complex (extensive) Discharge Coordination 0 PROCESS - Special Needs []  - Pediatric / Minor Patient Management 0 []  - Isolation Patient Management 0 []  - Hearing / Language / Visual special needs 0 []  - Assessment of Community assistance (transportation, D/C planning, etc.) 0 []  - Additional assistance / Altered  mentation 0 []  - Support Surface(s) Assessment (bed, cushion, seat, etc.) 0 INTERVENTIONS - Wound Cleansing / Measurement X - Simple Wound Cleansing - one wound 1 5 []  -  Complex Wound Cleansing - multiple wounds 0 X - Wound Imaging (photographs - any number of wounds) 1 5 []  - Wound Tracing (instead of photographs) 0 X - Simple Wound Measurement - one wound 1 5 []  - Complex Wound Measurement - multiple wounds 0 INTERVENTIONS - Wound Dressings X - Small Wound Dressing one or multiple wounds 1 10 []  - Medium Wound Dressing one or multiple wounds 0 []  - Large Wound Dressing one or multiple wounds 0 []  - Application of Medications - topical 0 []  - Application of Medications - injection 0 INTERVENTIONS - Miscellaneous []  - External ear exam 0 DARELYN, LEDERMAN. ([id number]) []  - Specimen Collection (cultures, biopsies, blood, body fluids, etc.) 0 []  - Specimen(s) / Culture(s) sent or taken to Lab for analysis 0 []  - Patient Transfer (multiple staff / Harrel Lemon Lift / Similar devices) 0 []  - Simple Staple / Suture removal (25 or less) 0 []  - Complex Staple / Suture removal (26 or more) 0 []  - Hypo / Hyperglycemic Management (close monitor of Blood Glucose) 0 []  - Ankle / Brachial Index (ABI) - do not check if billed separately 0 X - Vital Signs 1 5 Has the patient been seen at the hospital within the last three years: Yes Total Score: 70 Level Of Care: New/Established - Level 2 Electronic Signature(s) Signed: 04/29/2016 5:28:47 PM By: Regan Lemming BSN, RN Entered By: Regan Lemming on 04/29/2016 08:26:07 [name] ([id number]) -------------------------------------------------------------------------------- Encounter Discharge Information Details Patient Name: [name] Date of Service: 04/29/2016 8:15 AM Medical Record Number: [id number] Patient Account Number: [id number] Date of Birth/Sex: [birth year] (70 y.o. Female) Treating RN: Baruch Gouty, RN, BSN, Velva Harman Primary Care Physician:  Caryl Bis, ERIC Other Clinician: Referring Physician: Caryl Bis, ERIC Treating Physician/Extender: Frann Rider in Treatment: 17 Encounter Discharge Information Items Schedule Follow-up Appointment: No Medication Reconciliation completed and provided to Patient/Care No Ailyne Pawley: Provided on Clinical Summary of Care: 04/29/2016 Form Type Recipient Paper Patient SR Electronic Signature(s) Signed: 04/29/2016 8:30:38 AM By: Ruthine Dose Entered By: Ruthine Dose on 04/29/2016 08:30:38 [name] ([id number]) -------------------------------------------------------------------------------- Lower Extremity Assessment Details Patient Name: [name] Date of Service: 04/29/2016 8:15 AM Medical Record Number: [id number] Patient Account Number: [id number] Date of Birth/Sex: 10/21/1945 (70 y.o. Female) Treating RN: Afful, RN, BSN, Velva Harman Primary Care Physician: Caryl Bis, ERIC Other Clinician: Referring Physician: Caryl Bis, ERIC Treating Physician/Extender: Frann Rider in Treatment: 17 Vascular Assessment Pulses: Dorsalis Pedis Palpable: [Left:Yes] Posterior Tibial Extremity colors, hair growth, and conditions: Extremity Color: [Left:Normal] Hair Growth on Extremity: [Left:No] Temperature of Extremity: [Left:Warm] Capillary Refill: [Left:< 3 seconds] Toe Nail Assessment Left: Right: Thick: No Discolored: No Deformed: No Improper Length and Hygiene: No Electronic Signature(s) Signed: 04/29/2016 5:28:47 PM By: Regan Lemming BSN, RN Entered By: Regan Lemming on 04/29/2016 08:26:26 [name] ([id number]) -------------------------------------------------------------------------------- Multi Wound Chart Details Patient Name: [name] Date of Service: 04/29/2016 8:15 AM Medical Record Number: [id number] Patient Account Number: [id number] Date of Birth/Sex: August 01, 1945 (70 y.o. Female) Treating RN: Baruch Gouty, RN, BSN, Velva Harman Primary Care  Physician: Caryl Bis, ERIC Other Clinician: Referring Physician: Caryl Bis, ERIC Treating Physician/Extender: Frann Rider in Treatment: 17 Vital Signs Height(in): 63 Pulse(bpm): 81 Weight(lbs): 130 Blood Pressure 134/73 (mmHg): Body Mass Index(BMI): 23 Temperature(F): 98.0 Respiratory Rate 17 (breaths/min): Photos: [2:No Photos] [N/A:N/A] Wound Location: [2:Left Calcaneus] [N/A:N/A] Wounding Event: [2:Pressure Injury] [N/A:N/A] Primary Etiology: [2:Pressure Ulcer] [N/A:N/A] Secondary Etiology: [2:Diabetic Wound/Ulcer of the Lower Extremity] [N/A:N/A] Comorbid History: [2:Cataracts, Type I Diabetes, Rheumatoid Arthritis, Osteoarthritis, Neuropathy, Received Chemotherapy] [  N/A:N/A] Date Acquired: [2:07/15/2015] [N/A:N/A] Weeks of Treatment: [2:17] [N/A:N/A] Wound Status: [2:Open] [N/A:N/A] Measurements L x W x D 0.5x0.5x0.8 [N/A:N/A] (cm) Area (cm) : [2:0.196] [N/A:N/A] Volume (cm) : [2:0.157] [N/A:N/A] % Reduction in Area: [2:16.90%] [N/A:N/A] % Reduction in Volume: -67.00% [N/A:N/A] Classification: [2:Category/Stage III] [N/A:N/A] HBO Classification: [2:Grade 1] [N/A:N/A] Exudate Amount: [2:Medium] [N/A:N/A] Exudate Type: [2:Sanguinous] [N/A:N/A] Exudate Color: [2:red] [N/A:N/A] Wound Margin: [2:Flat and Intact] [N/A:N/A] Granulation Amount: [2:Medium (34-66%)] [N/A:N/A] Granulation Quality: [2:Pink, Pale] [N/A:N/A] Necrotic Amount: [2:Medium (34-66%)] [N/A:N/A] Exposed Structures: [N/A:N/A] Fat: Yes Fascia: No Tendon: No Muscle: No Joint: No Bone: No Epithelialization: None N/A N/A Periwound Skin Texture: Induration: Yes N/A N/A Edema: No Excoriation: No Callus: No Crepitus: No Fluctuance: No Friable: No Rash: No Scarring: No Periwound Skin Moist: Yes N/A N/A Moisture: Maceration: No Dry/Scaly: No Periwound Skin Color: Atrophie Blanche: No N/A N/A Cyanosis: No Ecchymosis: No Erythema: No Hemosiderin Staining: No Mottled: No Pallor:  No Rubor: No Temperature: No Abnormality N/A N/A Tenderness on Yes N/A N/A Palpation: Wound Preparation: Ulcer Cleansing: N/A N/A Rinsed/Irrigated with Saline Topical Anesthetic Applied: Other: lidocaine 4% Treatment Notes Electronic Signature(s) Signed: 04/29/2016 8:35:59 AM By: Christin Fudge MD, FACS Entered By: Christin Fudge on 04/29/2016 08:35:59 [name] ([id number]) -------------------------------------------------------------------------------- [place] Details Patient Name: [name] Date of Service: 04/29/2016 8:15 AM Medical Record Number: [id number] Patient Account Number: [id number] Date of Birth/Sex: 08-Jan-1946 (70 y.o. Female) Treating RN: Afful, RN, BSN, [place] Primary Care Physician: Caryl Bis, ERIC Other Clinician: Referring Physician: Caryl Bis, ERIC Treating Physician/Extender: Frann Rider in Treatment: 51 Active Inactive Orientation to the Wound Care Program Nursing Diagnoses: Knowledge deficit related to the wound healing center program Goals: Patient/caregiver will verbalize understanding of the [facility] [place] Program Date Initiated: 12/31/2015 Goal Status: Active Interventions: Provide education on orientation to the wound center Notes: Pressure Nursing Diagnoses: Knowledge deficit related to causes and risk factors for pressure ulcer development Knowledge deficit related to management of pressures ulcers Potential for impaired tissue integrity related to pressure, friction, moisture, and shear Goals: Patient will remain free from development of additional pressure ulcers Date Initiated: 12/31/2015 Goal Status: Active Patient will remain free of pressure ulcers Date Initiated: 12/31/2015 Goal Status: Active Patient/caregiver will verbalize risk factors for pressure ulcer development Date Initiated: 12/31/2015 Goal Status: Active Patient/caregiver will verbalize understanding of pressure ulcer  management Date Initiated: 12/31/2015 Goal Status: Active Interventions: SOWMYA, GEIER ([id number]) Assess: immobility, friction, shearing, incontinence upon admission and as needed Assess offloading mechanisms upon admission and as needed Assess potential for pressure ulcer upon admission and as needed Provide education on pressure ulcers Treatment Activities: Patient referred for pressure reduction/relief devices : 12/31/2015 Notes: Wound/Skin Impairment Nursing Diagnoses: Impaired tissue integrity Knowledge deficit related to smoking impact on wound healing Knowledge deficit related to ulceration/compromised skin integrity Goals: Patient/caregiver will verbalize understanding of skin care regimen Date Initiated: 12/31/2015 Goal Status: Active Ulcer/skin breakdown will have a volume reduction of 30% by week 4 Date Initiated: 12/31/2015 Goal Status: Active Ulcer/skin breakdown will have a volume reduction of 50% by week 8 Date Initiated: 12/31/2015 Goal Status: Active Ulcer/skin breakdown will have a volume reduction of 80% by week 12 Date Initiated: 12/31/2015 Goal Status: Active Ulcer/skin breakdown will heal within 14 weeks Date Initiated: 12/31/2015 Goal Status: Active Interventions: Assess patient/caregiver ability to obtain necessary supplies Assess patient/caregiver ability to perform ulcer/skin care regimen upon admission and as needed Assess ulceration(s) every visit Provide education on ulcer and skin care Treatment Activities: Skin  care regimen initiated : 12/31/2015 Topical wound management initiated : 12/31/2015 Notes: JAHMIYAH, FRATELLO ([id number]) Electronic Signature(s) Signed: 04/29/2016 5:28:47 PM By: Regan Lemming BSN, RN Entered By: Regan Lemming on 04/29/2016 08:22:47 MAKINZIE, BIERI ([id number]) -------------------------------------------------------------------------------- Pain Assessment Details Patient Name: [name] Date of Service:  04/29/2016 8:15 AM Medical Record Number: [id number] Patient Account Number: [id number] Date of Birth/Sex: [birth year] (70 y.o. Female) Treating RN: Baruch Gouty, RN, BSN, Velva Harman Primary Care Physician: Caryl Bis, ERIC Other Clinician: Referring Physician: Caryl Bis, ERIC Treating Physician/Extender: Frann Rider in Treatment: 17 Active Problems Location of Pain Severity and Description of Pain Patient Has Paino No Site Locations With Dressing Change: No Pain Management and Medication Current Pain Management: Electronic Signature(s) Signed: 04/29/2016 5:28:47 PM By: Regan Lemming BSN, RN Entered By: Regan Lemming on 04/29/2016 08:08:44 [name] ([id number]) -------------------------------------------------------------------------------- Wound Assessment Details Patient Name: [name] Date of Service: 04/29/2016 8:15 AM Medical Record Number: [id number] Patient Account Number: [id number] Date of Birth/Sex: 1945-11-02 (70 y.o. Female) Treating RN: Afful, RN, BSN, [place] Primary Care Physician: Caryl Bis, ERIC Other Clinician: Referring Physician: Caryl Bis, ERIC Treating Physician/Extender: Frann Rider in Treatment: 17 Wound Status Wound Number: 2 Primary Pressure Ulcer Etiology: Wound Location: Left Calcaneus Secondary Diabetic Wound/Ulcer of the Lower Wounding Event: Pressure Injury Etiology: Extremity Date Acquired: 07/15/2015 Wound Open Weeks Of Treatment: 17 Status: Clustered Wound: No Comorbid Cataracts, Type I Diabetes, History: Rheumatoid Arthritis, Osteoarthritis, Neuropathy, Received Chemotherapy Photos Photo Uploaded By: Regan Lemming on 04/29/2016 17:27:30 Wound Measurements Length: (cm) 0.5 Width: (cm) 0.5 Depth: (cm) 0.8 Area: (cm) 0.196 Volume: (cm) 0.157 % Reduction in Area: 16.9% % Reduction in Volume: -67% Epithelialization: None Tunneling: No Undermining: No Wound Description Classification: Category/Stage III Foul Odor  Af Diabetic Severity (Wagner): Grade 1 KHADRA, RUTT. ([id number]) ter Cleansing: No Wound Margin: Flat and Intact Exudate Amount: Medium Exudate Type: Sanguinous Exudate Color: red Wound Bed Granulation Amount: Medium (34-66%) Exposed Structure Granulation Quality: Pink, Pale Fascia Exposed: No Necrotic Amount: Medium (34-66%) Fat Layer Exposed: Yes Necrotic Quality: Adherent Slough Tendon Exposed: No Muscle Exposed: No Joint Exposed: No Bone Exposed: No Periwound Skin Texture Texture Color No Abnormalities Noted: No No Abnormalities Noted: No Callus: No Atrophie Blanche: No Crepitus: No Cyanosis: No Excoriation: No Ecchymosis: No Fluctuance: No Erythema: No Friable: No Hemosiderin Staining: No Induration: Yes Mottled: No Localized Edema: No Pallor: No Rash: No Rubor: No Scarring: No Temperature / Pain Moisture Temperature: No Abnormality No Abnormalities Noted: No Tenderness on Palpation: Yes Dry / Scaly: No Maceration: No Moist: Yes Wound Preparation Ulcer Cleansing: Rinsed/Irrigated with Saline Topical Anesthetic Applied: Other: lidocaine 4%, Electronic Signature(s) Signed: 04/29/2016 5:28:47 PM By: Regan Lemming BSN, RN Entered By: Regan Lemming on 04/29/2016 08:15:17 [name] ([id number]) -------------------------------------------------------------------------------- Vitals Details Patient Name: [name] Date of Service: 04/29/2016 8:15 AM Medical Record Number: [id number] Patient Account Number: [id number] Date of Birth/Sex: 11/04/1945 (70 y.o. Female) Treating RN: Afful, RN, BSN, [place] Primary Care Physician: Caryl Bis, ERIC Other Clinician: Referring Physician: Caryl Bis, ERIC Treating Physician/Extender: Frann Rider in Treatment: 17 Vital Signs Time Taken: 08:08 Temperature (F): 98.0 Height (in): 63 Pulse (bpm): 81 Weight (lbs): 130 Respiratory Rate (breaths/min): 17 Body Mass Index (BMI): 23 Blood Pressure  (mmHg): 134/73 Reference Range: 80 - 120 mg / dl Electronic Signature(s) Signed: 04/29/2016 5:28:47 PM By: Regan Lemming BSN, RN Entered By: Regan Lemming on 04/29/2016 08:09:03

## 2016-05-03 ENCOUNTER — Inpatient Hospital Stay: Payer: Medicare Other

## 2016-05-03 ENCOUNTER — Other Ambulatory Visit: Payer: Self-pay | Admitting: Hematology and Oncology

## 2016-05-03 ENCOUNTER — Telehealth: Payer: Self-pay

## 2016-05-03 ENCOUNTER — Inpatient Hospital Stay (HOSPITAL_BASED_OUTPATIENT_CLINIC_OR_DEPARTMENT_OTHER): Payer: Medicare Other | Admitting: Hematology and Oncology

## 2016-05-03 VITALS — BP 133/62 | HR 83 | Temp 95.9°F | Resp 18 | Wt 142.9 lb

## 2016-05-03 DIAGNOSIS — E785 Hyperlipidemia, unspecified: Secondary | ICD-10-CM

## 2016-05-03 DIAGNOSIS — Z79899 Other long term (current) drug therapy: Secondary | ICD-10-CM

## 2016-05-03 DIAGNOSIS — D709 Neutropenia, unspecified: Secondary | ICD-10-CM

## 2016-05-03 DIAGNOSIS — Z7689 Persons encountering health services in other specified circumstances: Secondary | ICD-10-CM

## 2016-05-03 DIAGNOSIS — G629 Polyneuropathy, unspecified: Secondary | ICD-10-CM | POA: Insufficient documentation

## 2016-05-03 DIAGNOSIS — Z794 Long term (current) use of insulin: Secondary | ICD-10-CM | POA: Insufficient documentation

## 2016-05-03 DIAGNOSIS — Z862 Personal history of diseases of the blood and blood-forming organs and certain disorders involving the immune mechanism: Secondary | ICD-10-CM | POA: Insufficient documentation

## 2016-05-03 DIAGNOSIS — R0602 Shortness of breath: Secondary | ICD-10-CM

## 2016-05-03 DIAGNOSIS — L89623 Pressure ulcer of left heel, stage 3: Secondary | ICD-10-CM | POA: Diagnosis not present

## 2016-05-03 DIAGNOSIS — C786 Secondary malignant neoplasm of retroperitoneum and peritoneum: Secondary | ICD-10-CM | POA: Insufficient documentation

## 2016-05-03 DIAGNOSIS — Z86718 Personal history of other venous thrombosis and embolism: Secondary | ICD-10-CM

## 2016-05-03 DIAGNOSIS — I6522 Occlusion and stenosis of left carotid artery: Secondary | ICD-10-CM

## 2016-05-03 DIAGNOSIS — Z7901 Long term (current) use of anticoagulants: Secondary | ICD-10-CM | POA: Insufficient documentation

## 2016-05-03 DIAGNOSIS — Z9221 Personal history of antineoplastic chemotherapy: Secondary | ICD-10-CM | POA: Diagnosis not present

## 2016-05-03 DIAGNOSIS — K449 Diaphragmatic hernia without obstruction or gangrene: Secondary | ICD-10-CM

## 2016-05-03 DIAGNOSIS — E108 Type 1 diabetes mellitus with unspecified complications: Secondary | ICD-10-CM

## 2016-05-03 DIAGNOSIS — C569 Malignant neoplasm of unspecified ovary: Secondary | ICD-10-CM

## 2016-05-03 DIAGNOSIS — Z9049 Acquired absence of other specified parts of digestive tract: Secondary | ICD-10-CM | POA: Insufficient documentation

## 2016-05-03 DIAGNOSIS — I110 Hypertensive heart disease with heart failure: Secondary | ICD-10-CM | POA: Diagnosis not present

## 2016-05-03 DIAGNOSIS — R188 Other ascites: Secondary | ICD-10-CM | POA: Insufficient documentation

## 2016-05-03 DIAGNOSIS — I998 Other disorder of circulatory system: Secondary | ICD-10-CM

## 2016-05-03 DIAGNOSIS — Z5111 Encounter for antineoplastic chemotherapy: Secondary | ICD-10-CM | POA: Insufficient documentation

## 2016-05-03 DIAGNOSIS — N1330 Unspecified hydronephrosis: Secondary | ICD-10-CM | POA: Insufficient documentation

## 2016-05-03 DIAGNOSIS — M069 Rheumatoid arthritis, unspecified: Secondary | ICD-10-CM | POA: Insufficient documentation

## 2016-05-03 DIAGNOSIS — Z808 Family history of malignant neoplasm of other organs or systems: Secondary | ICD-10-CM

## 2016-05-03 DIAGNOSIS — I10 Essential (primary) hypertension: Secondary | ICD-10-CM

## 2016-05-03 DIAGNOSIS — Z8619 Personal history of other infectious and parasitic diseases: Secondary | ICD-10-CM | POA: Insufficient documentation

## 2016-05-03 DIAGNOSIS — I252 Old myocardial infarction: Secondary | ICD-10-CM | POA: Insufficient documentation

## 2016-05-03 DIAGNOSIS — C801 Malignant (primary) neoplasm, unspecified: Secondary | ICD-10-CM

## 2016-05-03 DIAGNOSIS — E10621 Type 1 diabetes mellitus with foot ulcer: Secondary | ICD-10-CM | POA: Diagnosis present

## 2016-05-03 DIAGNOSIS — Z90710 Acquired absence of both cervix and uterus: Secondary | ICD-10-CM | POA: Insufficient documentation

## 2016-05-03 DIAGNOSIS — I5032 Chronic diastolic (congestive) heart failure: Secondary | ICD-10-CM

## 2016-05-03 DIAGNOSIS — E876 Hypokalemia: Secondary | ICD-10-CM

## 2016-05-03 DIAGNOSIS — Z933 Colostomy status: Secondary | ICD-10-CM

## 2016-05-03 DIAGNOSIS — Z8701 Personal history of pneumonia (recurrent): Secondary | ICD-10-CM | POA: Insufficient documentation

## 2016-05-03 DIAGNOSIS — R918 Other nonspecific abnormal finding of lung field: Secondary | ICD-10-CM | POA: Insufficient documentation

## 2016-05-03 DIAGNOSIS — C787 Secondary malignant neoplasm of liver and intrahepatic bile duct: Secondary | ICD-10-CM

## 2016-05-03 DIAGNOSIS — I6340 Cerebral infarction due to embolism of unspecified cerebral artery: Secondary | ICD-10-CM | POA: Insufficient documentation

## 2016-05-03 DIAGNOSIS — M898X9 Other specified disorders of bone, unspecified site: Secondary | ICD-10-CM | POA: Insufficient documentation

## 2016-05-03 DIAGNOSIS — Z87891 Personal history of nicotine dependence: Secondary | ICD-10-CM | POA: Diagnosis not present

## 2016-05-03 DIAGNOSIS — Z90722 Acquired absence of ovaries, bilateral: Secondary | ICD-10-CM

## 2016-05-03 DIAGNOSIS — M2550 Pain in unspecified joint: Secondary | ICD-10-CM | POA: Insufficient documentation

## 2016-05-03 DIAGNOSIS — Z7952 Long term (current) use of systemic steroids: Secondary | ICD-10-CM | POA: Diagnosis not present

## 2016-05-03 DIAGNOSIS — K219 Gastro-esophageal reflux disease without esophagitis: Secondary | ICD-10-CM

## 2016-05-03 DIAGNOSIS — Z9641 Presence of insulin pump (external) (internal): Secondary | ICD-10-CM | POA: Insufficient documentation

## 2016-05-03 DIAGNOSIS — K222 Esophageal obstruction: Secondary | ICD-10-CM

## 2016-05-03 DIAGNOSIS — E1040 Type 1 diabetes mellitus with diabetic neuropathy, unspecified: Secondary | ICD-10-CM | POA: Diagnosis not present

## 2016-05-03 DIAGNOSIS — I2510 Atherosclerotic heart disease of native coronary artery without angina pectoris: Secondary | ICD-10-CM | POA: Diagnosis not present

## 2016-05-03 DIAGNOSIS — I82C11 Acute embolism and thrombosis of right internal jugular vein: Secondary | ICD-10-CM

## 2016-05-03 LAB — COMPREHENSIVE METABOLIC PANEL
ALT: 12 U/L — ABNORMAL LOW (ref 14–54)
AST: 18 U/L (ref 15–41)
Albumin: 3.3 g/dL — ABNORMAL LOW (ref 3.5–5.0)
Alkaline Phosphatase: 69 U/L (ref 38–126)
Anion gap: 5 (ref 5–15)
BUN: 17 mg/dL (ref 6–20)
CO2: 24 mmol/L (ref 22–32)
Calcium: 8.7 mg/dL — ABNORMAL LOW (ref 8.9–10.3)
Chloride: 103 mmol/L (ref 101–111)
Creatinine, Ser: 0.68 mg/dL (ref 0.44–1.00)
GFR calc Af Amer: >60 mL/min (ref >60)
GFR calc non Af Amer: >60 mL/min (ref >60)
Glucose, Bld: 212 mg/dL — ABNORMAL HIGH (ref 65–99)
Potassium: 4.6 mmol/L (ref 3.5–5.1)
Sodium: 132 mmol/L — ABNORMAL LOW (ref 135–145)
Total Bilirubin: 0.4 mg/dL (ref 0.3–1.2)
Total Protein: 7 g/dL (ref 6.5–8.1)

## 2016-05-03 LAB — CBC WITH DIFFERENTIAL/PLATELET
Basophils Absolute: 0 10*3/uL (ref 0–0.1)
Basophils Relative: 1 %
Eosinophils Absolute: 0.1 10*3/uL (ref 0–0.7)
Eosinophils Relative: 2 %
HCT: 27.2 % — ABNORMAL LOW (ref 35.0–47.0)
Hemoglobin: 9.3 g/dL — ABNORMAL LOW (ref 12.0–16.0)
Lymphocytes Relative: 14 %
Lymphs Abs: 0.5 10*3/uL — ABNORMAL LOW (ref 1.0–3.6)
MCH: 30.8 pg (ref 26.0–34.0)
MCHC: 34.2 g/dL (ref 32.0–36.0)
MCV: 89.9 fL (ref 80.0–100.0)
Monocytes Absolute: 0.3 10*3/uL (ref 0.2–0.9)
Monocytes Relative: 8 %
Neutro Abs: 2.9 10*3/uL (ref 1.4–6.5)
Neutrophils Relative %: 75 %
Platelets: 244 10*3/uL (ref 150–440)
RBC: 3.02 MIL/uL — ABNORMAL LOW (ref 3.80–5.20)
RDW: 18.7 % — ABNORMAL HIGH (ref 11.5–14.5)
WBC: 3.9 10*3/uL (ref 3.6–11.0)

## 2016-05-03 LAB — MAGNESIUM: Magnesium: 1.6 mg/dL — ABNORMAL LOW (ref 1.7–2.4)

## 2016-05-03 MED ORDER — SODIUM CHLORIDE 0.9 % IV SOLN
1.0000 g | Freq: Once | INTRAVENOUS | Status: AC
  Administered 2016-05-03: 1 g via INTRAVENOUS
  Filled 2016-05-03: qty 2

## 2016-05-03 MED ORDER — HEPARIN SOD (PORK) LOCK FLUSH 100 UNIT/ML IV SOLN
INTRAVENOUS | Status: AC
  Filled 2016-05-03: qty 5

## 2016-05-03 MED ORDER — SODIUM CHLORIDE 0.9 % IV SOLN: 10.0000 mg | Freq: Once | INTRAVENOUS | Status: DC

## 2016-05-03 MED ORDER — SODIUM CHLORIDE 0.9 % IJ SOLN
10.0000 mL | Freq: Once | INTRAMUSCULAR | Status: AC
  Administered 2016-05-03: 10 mL via INTRAVENOUS
  Filled 2016-05-03: qty 10

## 2016-05-03 MED ORDER — PALONOSETRON HCL INJECTION 0.25 MG/5ML
0.2500 mg | Freq: Once | INTRAVENOUS | Status: AC
  Administered 2016-05-03: 0.25 mg via INTRAVENOUS
  Filled 2016-05-03: qty 5

## 2016-05-03 MED ORDER — HEPARIN SOD (PORK) LOCK FLUSH 100 UNIT/ML IV SOLN
500.0000 [IU] | Freq: Once | INTRAVENOUS | Status: AC
  Administered 2016-05-03: 500 [IU] via INTRAVENOUS

## 2016-05-03 MED ORDER — SODIUM CHLORIDE 0.9 % IV SOLN
355.5000 mg | Freq: Once | INTRAVENOUS | Status: AC
  Administered 2016-05-03: 360 mg via INTRAVENOUS
  Filled 2016-05-03: qty 36

## 2016-05-03 MED ORDER — GEMCITABINE HCL CHEMO INJECTION 1 GM/26.3ML
1600.0000 mg | Freq: Once | INTRAVENOUS | Status: AC
  Administered 2016-05-03: 1600 mg via INTRAVENOUS
  Filled 2016-05-03: qty 26.3

## 2016-05-03 MED ORDER — SODIUM CHLORIDE 0.9 % IV SOLN
Freq: Once | INTRAVENOUS | Status: AC
  Administered 2016-05-03: 11:00:00 via INTRAVENOUS
  Filled 2016-05-03: qty 1000

## 2016-05-03 MED ORDER — DEXAMETHASONE SODIUM PHOSPHATE 10 MG/ML IJ SOLN
10.0000 mg | Freq: Once | INTRAMUSCULAR | Status: AC
  Administered 2016-05-03: 10 mg via INTRAVENOUS
  Filled 2016-05-03: qty 1

## 2016-05-03 NOTE — Telephone Encounter (Signed)
  Oncology Nurse Navigator Documentation Voicemail left with Ms. Woodrow to return call for appt change. Dr. Fransisca Connors will be in surgery during her appt time. Navigator Location: [facility] (05/03/16 1400)   )Navigator Encounter Type: Telephone (05/03/16 1400) Telephone: Appt Confirmation/Clarification (05/03/16 1400)                                                  Time Spent with Patient: 15 (05/03/16 1400)

## 2016-05-03 NOTE — Progress Notes (Signed)
[facility] day:  05/03/2016   Chief Complaint: [name] is a 71 y.o. female with stage IV ovarian cancer who is seen for assessment prior to day 1 of cycle #3 carboplatin and gemcitabine.  HPI:  The patient was last seen in the medical oncology clinic on 04/18/2016.  At that time, she was on day 8 of cycle #2 chemotherapy.  Counts were low (WBC 1900 with ANC 1100).  Decision was made not to proceed with day 8 gemcitabine.  She received GCSF 300 mcg x 3 days.  CBC on 04/21/2016 revealed a WBC 10,100 and platelets 105,000.  CBC on 04/26/2016 revealed a WBC of 3500 with a platelet count of 196,000.  She has remained on Eliquis.   Symptomatically, she is doing well.  She notes a little abdominal tightness.  She denies any pain.  She denies any nausea or vomiting.  Blood sugar has been controlled.  She sees Dr. Ivor Messier on 05/11/2016.   Past Medical History:  Diagnosis Date  . CAD (coronary artery disease)    a. 09/2012 Cath: LM nl, LAD 95p, 65m LCX 946mOM2 50, RCA 100.  . Marland Kitchenholelithiasis   . Chronic diastolic CHF (congestive heart failure) ([facility]  . Collagen vascular disease ([place]  . Esophageal stricture   . Exertional shortness of breath   . GERD (gastroesophageal reflux disease)   . H/O hiatal hernia   . Herniated disc   . History of pancytopenia   . Hyperlipidemia   . Hypertension   . Hypokalemia   . Leukopenia 2012   s/p bone marrow biopsy, Dr. PaMa Hillock. NSTEMI (non-ST elevated myocardial infarction) (HCMaish Vaya5/2014   "mild" (10/18/2012)  . Ovarian cancer (HCM[place]2016   chemo  . Pneumonia 2013; 08/2012   "one lung; double" (10/18/2012)  . Rheumatoid arthritis(714.0)   . Type I diabetes mellitus ([place]   "dx'd in 1957" (10/18/2012)    Past Surgical History:  Procedure Laterality Date  . ABDOMINAL HYSTERECTOMY  06/15/2015   Dx L/S, EXLAP TAH BSO omentectomy RSRx colostomy diaphragm resection stripping  . CARDIAC CATHETERIZATION   10/18/2012   "first one was today" (10/18/2012)  . CATARACT EXTRACTION W/ INTRAOCULAR LENS IMPLANT Right 2010  . CHOLECYSTECTOMY  06/15/2015   combined case with ovarian cancer debulking  . COLON SURGERY    . CORONARY ARTERY BYPASS GRAFT N/A 10/19/2012   Procedure: CORONARY ARTERY BYPASS GRAFTING (CABG);  Surgeon: StMelrose NakayamaMD;  [place] Service: Open Heart Surgery;  Laterality: N/A;  . ESOPHAGEAL DILATION     "3 or 4 times" (10/19/2011)  . ESOPHAGOGASTRODUODENOSCOPY  2012   Dr. IfMinna Merritts. OSTOMY    . OVARY SURGERY     removal  . PERIPHERAL VASCULAR CATHETERIZATION N/A 03/02/2015   Procedure: PoGlori Luisath Insertion;  Surgeon: JaAlgernon HuxleyMD;  [facility];  Service: Cardiovascular;  Laterality: N/A;  . TUBAL LIGATION  1970    Family History  Problem Relation Age of Onset  . Diabetes Mother   . Arthritis Mother   . Diabetes Father   . Arthritis Father   . Bone cancer Sister     Social History:  reports that she quit smoking about 21 years ago. Her smoking use included Cigarettes. She has a 30.00 pack-year smoking history. She has never used smokeless tobacco. She reports that she does not drink alcohol or use drugs.  She is accompanied  by her husband today.  Allergies:  Allergies  Allergen Reactions  . Codeine Nausea And Vomiting  . Latex Rash    Current Medications: Current Outpatient Prescriptions  Medication Sig Dispense Refill  . Calcium-Vitamin D 600-200 MG-UNIT tablet Take 2 tablets by mouth daily.     Marland Kitchen ELIQUIS 5 MG TABS tablet     . folic acid (FOLVITE) 1 MG tablet TAKE 1 TABLET BY MOUTH ONCE A DAY 90 tablet 3  . furosemide (LASIX) 80 MG tablet Take 80 mg by mouth daily as needed.     . Insulin Human (INSULIN PUMP) SOLN Pt uses Humalog insulin.    Marland Kitchen insulin lispro (HUMALOG) 100 UNIT/ML injection Inject 0.06 mLs (6 Units total) into the skin daily. 30 mL 3  . lidocaine-prilocaine (EMLA) cream Apply 1 application topically as needed  (prior to accessing port).    . loratadine (CLARITIN) 10 MG tablet Take 1 tablet by mouth daily as needed.    Marland Kitchen losartan (COZAAR) 100 MG tablet Take 1 tablet (100 mg total) by mouth daily. 90 tablet 1  . magnesium oxide (MAG-OX) 400 (241.3 Mg) MG tablet Take 1 tablet (400 mg total) by mouth daily. 30 tablet 2  . metoprolol tartrate (LOPRESSOR) 25 MG tablet Take 1 tablet (25 mg total) by mouth 2 (two) times daily. 180 tablet 3  . ondansetron (ZOFRAN) 8 MG tablet TAKE 1 TABLET BY MOUTH EVERY 8 HOURS AS NEEDED FOR NAUSEA OR VOMITING 30 tablet 1  . ONE TOUCH ULTRA TEST test strip     . oxyCODONE (OXY IR/ROXICODONE) 5 MG immediate release tablet Take 5 mg by mouth every 6 (six) hours as needed. Reported on 11/17/2015    . pantoprazole (PROTONIX) 40 MG tablet TAKE 1 TABLET BY MOUTH TWICE A DAY 180 tablet 1  . potassium chloride SA (K-DUR,KLOR-CON) 20 MEQ tablet TAKE 2 TABLETS BY MOUTH ONCE A DAY 60 tablet 1  . ranitidine (ZANTAC) 150 MG tablet Take 1 tablet (150 mg total) by mouth 2 (two) times daily. 180 tablet 1  . vitamin C (ASCORBIC ACID) 500 MG tablet Take 500 mg by mouth daily.    Marland Kitchen zinc gluconate 50 MG tablet Take 50 mg by mouth daily.     No current facility-administered medications for this visit.    Facility-Administered Medications Ordered in Other Visits  Medication Dose Route Frequency Provider Last Rate Last Dose  . sodium chloride 0.9 % injection 10 mL  10 mL Intracatheter PRN Lequita Asal, MD      . sodium chloride 0.9 % injection 10 mL  10 mL Intravenous PRN Lequita Asal, MD   10 mL at 04/13/15 4098    Review of Systems:  GENERAL:  Feels good.  No fever, chills or sweats.  Weight up 4 pounds. PERFORMANCE STATUS (ECOG):  1 HEENT:  No visual changes, sore throat, mouth sores or tenderness. Lungs:  No shortness of breath or cough.  No hemoptysis. Cardiac:  No chest pain, palpitations, orthopnea, or PND. GI:  Some abdominal tightness.  No abdominal pain or distention.   No nausea, vomiting, diarrhea, melena or hematochezia. GU:  No urgency, frequency, dysuria, or hematuria. Musculoskeletal:  Severe rheumatoid arthritis (off MTX and prednisone).  Osteoporosis.  No back pain. No muscle tenderness. Extremities:  No pain or swelling. Skin:  No increased bruising or bleeding on Eliquis.  No rashes or skin changes. Neuro:  Neuropathy (stable).  No headache, numbness or weakness, balance or coordination issues. Endocrine:  Diabetes  on an insulin pump.  Blood sugar controlled.  No thyroid issues, hot flashes or night sweats. Psych:  No mood changes, depression or anxiety. Pain: No pain. Review of systems:  All other systems reviewed and found to be negative.  Physical Exam: Blood pressure 133/62, pulse 83, temperature (!) 95.9 F (35.5 C), temperature source Tympanic, resp. rate 18, weight 142 lb 13.7 oz (64.8 kg). GENERAL:  Well developed, well nourished woman sitting comfortably in the exam room in no acute distress. MENTAL STATUS:  Alert and oriented to person, place and time. HEAD:  Curly gray hair.  Normocephalic, atraumatic, face symmetric, no Cushingoid features. EYES:  Gold rimmed glasses.  Blue eyes.  No conjunctivitis or scleral icterus.  ENT: Oropharynx clear without lesion. Tongue normal. Mucous membranes moist.  RESPIRATORY: Clear to auscultation without rales, wheezes or rhonchi. CARDIOVASCULAR: Regular rate and rhythm without murmur, rub or gallop. ABDOMEN: Soft, non-tender with active bowel sounds and no appreciable hepatosplenomegaly. No palpable nodularity or masses.  No shifting dullness. SKIN: No rashes, ulcers, or bruises. EXTREMITIES:  No edema, skin discoloration or tenderness. No palpable cords. LYMPH NODES: No palpable cervical, supraclavicular, axillary or inguinal adenopathy  NEUROLOGICAL: Appropriate. PSYCH: Appropriate.   Appointment on 05/03/2016  Component Date Value Ref Range Status  . Sodium 05/03/2016 132* 135 - 145  mmol/L Final  . Potassium 05/03/2016 4.6  3.5 - 5.1 mmol/L Final  . Chloride 05/03/2016 103  101 - 111 mmol/L Final  . CO2 05/03/2016 24  22 - 32 mmol/L Final  . Glucose, Bld 05/03/2016 212* 65 - 99 mg/dL Final  . BUN 05/03/2016 17  6 - 20 mg/dL Final  . Creatinine, Ser 05/03/2016 0.68  0.44 - 1.00 mg/dL Final  . Calcium 05/03/2016 8.7* 8.9 - 10.3 mg/dL Final  . Total Protein 05/03/2016 7.0  6.5 - 8.1 g/dL Final  . Albumin 05/03/2016 3.3* 3.5 - 5.0 g/dL Final  . AST 05/03/2016 18  15 - 41 U/L Final  . ALT 05/03/2016 12* 14 - 54 U/L Final  . Alkaline Phosphatase 05/03/2016 69  38 - 126 U/L Final  . Total Bilirubin 05/03/2016 0.4  0.3 - 1.2 mg/dL Final  . GFR calc non Af Amer 05/03/2016 >60  >60 mL/min Final  . GFR calc Af Amer 05/03/2016 >60  >60 mL/min Final   Comment: (NOTE) The eGFR has been calculated using the CKD EPI equation. This calculation has not been validated in all clinical situations. eGFR's persistently <60 mL/min signify possible Chronic Kidney Disease.   . Anion gap 05/03/2016 5  5 - 15 Final  . WBC 05/03/2016 3.9  3.6 - 11.0 K/uL Final  . RBC 05/03/2016 3.02* 3.80 - 5.20 MIL/uL Final  . Hemoglobin 05/03/2016 9.3* 12.0 - 16.0 g/dL Final  . HCT 05/03/2016 27.2* 35.0 - 47.0 % Final  . MCV 05/03/2016 89.9  80.0 - 100.0 fL Final  . MCH 05/03/2016 30.8  26.0 - 34.0 pg Final  . MCHC 05/03/2016 34.2  32.0 - 36.0 g/dL Final  . RDW 05/03/2016 18.7* 11.5 - 14.5 % Final  . Platelets 05/03/2016 244  150 - 440 K/uL Final  . Neutrophils Relative % 05/03/2016 75  % Final  . Neutro Abs 05/03/2016 2.9  1.4 - 6.5 K/uL Final  . Lymphocytes Relative 05/03/2016 14  % Final  . Lymphs Abs 05/03/2016 0.5* 1.0 - 3.6 K/uL Final  . Monocytes Relative 05/03/2016 8  % Final  . Monocytes Absolute 05/03/2016 0.3  0.2 - 0.9 K/uL  Final  . Eosinophils Relative 05/03/2016 2  % Final  . Eosinophils Absolute 05/03/2016 0.1  0 - 0.7 K/uL Final  . Basophils Relative 05/03/2016 1  % Final  .  Basophils Absolute 05/03/2016 0.0  0 - 0.1 K/uL Final  . Magnesium 05/03/2016 1.6* 1.7 - 2.4 mg/dL Final    Assessment:  [name] is a 71 y.o. female with stage IV ovarian cancer.  She presented with abdominal discomfort and bloating.  Omental biopsy on 02/23/2015 revealed metastatic high grade serous carcinoma, consistent with gynecologic origin.   She was initially diagnosed with clinical stage IIIC (T3cN1Mx).  CA125 was 707 on 02/17/2015.  Abdomen and pelvic CT scan on 02/13/2015 revealed bilateral mass-like adnexal regions (right adnexal mass 5.6 x 5.0 cm and the left adnexa mass 10.3 x 6.0 x 9.9 cm).  There was a large amount of soft tissue throughout the peritoneal cavity involving the omentum and other peritoneal surfaces.  There was a small volume ascites. There was a peripheral 3.3 x 1.9 cm low-attenuation lesion overlying the right lobe of the liver , likely representing a serosal implant. There was 1.4 x 2.2 cm ill-defined peripheral lesion within the inferior aspect of segment 6 adjacent to the ampulla in the duodenum.   She received 4 cycles of neoadjuvant carboplatin and Taxol (03/05/2015 - 05/22/2015).  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  She had loose stools on oral magnesium.  She was initially on Neurontin then switched Lyrica with cycle #3.  Cycle #4 was notable for neutropenia (ANC 300) requiring GCSF x 3 days.    CA125 was 802.9 on 03/30/2015, 567.9 on 04/13/2015, 168.8 on 05/15/2015, 85.2 on 07/17/2015, 68.6 on 07/28/2015, 34.5 on 09/17/2015, 20.7 on 11/06/2015, 49 on 01/22/2016, 106.1 on 02/22/2016, 138.8 on 03/07/2016, 220.8 on 03/21/2016, 152.8 on 04/11/2016, 125.6 on 04/18/2016, and 76 on 05/03/2016.  Abdominal and pelvic CT scan on 04/28/2015 revealed decreasing bilateral ovarian masses.   The left adnexal mass measured 4.0 x 6.1 cm (previously 5.0 x 8.5 cm). The right ovary measured 3.8 x 4.9 cm (previously 4.7 x 5.6 cm).  There was improved peritoneal  carcinomatosis.  There was a small amount of ascites.  The hepatic dome lesion was stable. The previously seen right hepatic lobe lesion was not well-visualized.  There was a small left pleural effusion and trace right pleural effusion.  The nodular lesion at the ampulla of Vater, extending into the duodenum was stable.  There was no biliary ductal dilatation.  She underwent exploratory laparotomy, lysis of adhesions, total abdominal hysterectomy with bilateral salpingo-oophorectomy, infracolic omentectomy, optimal tumor debulking(< 1 cm), recto-sigmoid resection with creation of end colostomy, cholecystectomy, mobilization of splenic flexure and liver with diaphragmatic stripping on 06/15/2015. The right diaphragm was cleared of tumor. During dissection, the diaphragm was entered and closed with sutures.   She has had a recurrent right sided pleural effusion.  She underwent thoracentesis of 650 cc on post-operative day 3.  She was admitted to [facility] on 07/01/2015 and 07/07/2015 for recurrent shortness of breath.  She has undergone 2 additional thoracentesis (1.1 L on 07/02/2015 and 850 cc on 07/08/2015).  Cytology was negative x 2.  Bilateral lower extremity duplex on 07/03/2015 was negative.  Echo on 07/08/2015 revealed en EF of 55-60%.    She has severe rheumatoid arthritis.  Methotrexate and Enbrel were initially on hold.  She has a normocytic anemia.  Work-up on 02/17/2015 revealed a normal ferritin, B12, folate, TSH.  She denies  any melena or hematochezia.    She has anemia likely due to chronic disease. She received 1 unit PRBCs during her admission at [facility]. She denies any melena or hematochezia. She has diabetes and is on an insulin pump.  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 She was on Lovenox 60 mg twice a day.  She switched to Eliquis on  04/16/2016.  She was admitted to [facility] from 07/29/2015 - 08/10/2015 with a right-sided empyema and liver abscess. She underwent CT-guided placement of a liver abscess drain on 07/30/2015. Liver abscess culture grew out group B strep and Enterobacter which was sensitive to Zosyn. She was transitioned to ertapenem Colbert Ewing) prior to discharge.  She was readmitted to [facility] on 09/17/2015.  Chest, abdomen, and pelvic CT scan on 09/01/2015 revealed continued decrease in perihepatic fluid collection contiguous with the right pleural space with percutaneous drain. Drain was removed on 09/01/2015.  Chest, abdomen, and pelvic CT scan on 09/11/2015 revealed a residual versus recurrent fluid collection posterior to the right hepatic lobe (2.6 x 1.1 x 4.0 cm).  Chest, abdomen, and pelvic CT scan on 10/13/2015 revealed resolution of the empyema.    She has had persistent neutropenia felt secondary to her rheumatoid arthritis.  Folate and MMA were normal.  TSH was 6.13 (high) with a free T4 of 1.17 (0.61-1.12).  She began methotrexate (10 mg a week) and prednisone (5 mg a day) for severe rheumatoid arthritis on 12/17/2015.  Bone marrow aspirate and biopsy on 06/09/2010 revealed a hypercellular marrow (70%) with no evidence of dysplasia or malignancy.  Flow cytometry was negative.  Cytogenetics were normal (46,XX).  FISH studies were negative for MDS.  Bone marrow aspirate and biopsy on 12/03/2015 revealed a normocellular to mildy hypercellular marrow for age (40%) with left shifted myelopoiesis, non specific dyserythropoiesis and mild megakaryocytic atypia with no increase in blasts.  There were multiple small nonspecific lymphoid aggregates (favor reactive). There was no increase in reticulin.  There was decreased myeloid cells (37%) with left shifted maturation and 1% atypical myelod blasts.  There was relatively increased monocytic cells (11%), relatively increased lymphoid cells (36%), and relatively increased  eosinophils (6%).  Cytogenetics were normal (28, XX).  SNP microarray was normal.  Alkaline phosphatase was 348 (38-126) on 11/06/2015 and 431 on 11/17/2015.   Fractionated alkaline phosphatase on 11/09/2015 revealed 21% bone and 79% liver.  MRI of the abdomen on 12/14/2015 revealed mild extrahepatic biliary duct dilatation without obstructing lesion identified.  Chest, abdomen, and pelvic CT scan on 02/15/2016 revealed multiple soft tissue nodules throughout the pelvis, small bowel mesenteric and peritoneum and, concerning for peritoneal metastatic disease.  There was soft tissue irregularity within the left upper quadrant.  There was mild right hydronephrosis (etiology unclear).  There was interval resolution of previously described right pleural based fluid and gas collection. There was a pleural based nodule within the right lower hemi-thorax concerning for pleural based metastasis.  There were small bilateral pleural effusions.  There was pulmonary nodularity, predominately within the left upper lobe (metastatic or infectious/inflammatory etiology).  There was slightly increased mediastinal adenopathy (infectious/inflammatory or metastatic).  There was a low attenuation lesion within the left hepatic lobe (complicated fluid within the fissure or metastatic disease).  She was on tamoxifen from 02/25/2016 - 03/14/2016.  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  Abdomen and pelvic  CT scan on 03/08/2016 revealed mild progression of peritoneal disease in the abdomen.  There was interval increase in loculated fluid around the lateral segment left liver, stomach, and spleen.  There was persistent soft tissue lesion at the level of the ampulla with mild intra and extrahepatic biliary duct dilatation.  There was persistent intrahepatic and capsular metastatic disease involving the liver.  She is currently day 1 of cycle #3 carboplatin and gemcitabine (03/21/2016 - 04/11/2016) with Neulasta  support.  Cycle #1 and #2 were truncated secondary to low counts at initiation of treatment and day 8, respectively.  Nadir platelet count was 107,000 on day 11 of cycle #1 and 105,000 on day 11 cycle #2.   Symptomatically, she feels good.  Counts are adequate (WBC 3900 with ANC 2900).  She has mild hypomagnesemia (1.6) despite oral magnesium.  Plan: 1.  Labs today:  CBC with diff, CMP, Mg, CA125. 2.  Discuss counts with cycle #2.  Discuss plan to proceed with cycle #3 and GCSF on days 2-4 followed by day 8 chemotherapy followed by Neulasta.  Plan to monitor platelets closely on Eliquis. 3.  Day 1 carboplatin and gemcitabine today. 4.  Magnesium 1 gm IV today 5.  RTC for Neupogen 300 mcg SQ x 3 days (01/03, 01/04, 01/05) 6.  RTC on 05/10/2016 for MD assessment, labs (CBC with diff, CMP, Mg), and day 8 gemcitabine.   Lequita Asal, MD  05/03/2016, 10:46 AM

## 2016-05-04 ENCOUNTER — Inpatient Hospital Stay: Payer: Medicare Other

## 2016-05-04 ENCOUNTER — Telehealth: Payer: Self-pay | Admitting: *Deleted

## 2016-05-04 DIAGNOSIS — E10621 Type 1 diabetes mellitus with foot ulcer: Secondary | ICD-10-CM | POA: Diagnosis not present

## 2016-05-04 LAB — CA 125: CA 125: 76.8 U/mL — ABNORMAL HIGH (ref 0.0–38.1)

## 2016-05-04 MED ORDER — TBO-FILGRASTIM 300 MCG/0.5ML ~~LOC~~ SOSY
300.0000 ug | PREFILLED_SYRINGE | Freq: Once | SUBCUTANEOUS | Status: AC
  Administered 2016-05-04: 300 ug via SUBCUTANEOUS
  Filled 2016-05-04: qty 0.5

## 2016-05-04 MED ORDER — FILGRASTIM 300 MCG/0.5ML IJ SOSY: 300.0000 ug | PREFILLED_SYRINGE | Freq: Once | INTRAMUSCULAR | Status: DC

## 2016-05-04 NOTE — Telephone Encounter (Signed)
Called patient to inform her that tumor marker is decreasing,  Patient grateful for call.

## 2016-05-04 NOTE — Telephone Encounter (Signed)
-----   Message from Lequita Asal, MD sent at 05/04/2016  9:09 AM EST ----- Regarding: Please call patient   CA27.29 decreasing !  M   ----- Message ----- From: Interface, Lab In [place] Sent: 05/03/2016   9:59 AM To: Lequita Asal, MD

## 2016-05-05 ENCOUNTER — Inpatient Hospital Stay: Payer: Medicare Other

## 2016-05-05 DIAGNOSIS — E10621 Type 1 diabetes mellitus with foot ulcer: Secondary | ICD-10-CM | POA: Diagnosis not present

## 2016-05-05 MED ORDER — FILGRASTIM 300 MCG/0.5ML IJ SOSY: 300.0000 ug | PREFILLED_SYRINGE | Freq: Once | INTRAMUSCULAR | Status: DC

## 2016-05-05 MED ORDER — TBO-FILGRASTIM 300 MCG/0.5ML ~~LOC~~ SOSY
300.0000 ug | PREFILLED_SYRINGE | Freq: Once | SUBCUTANEOUS | Status: AC
  Administered 2016-05-05: 300 ug via SUBCUTANEOUS
  Filled 2016-05-05: qty 0.5

## 2016-05-06 ENCOUNTER — Inpatient Hospital Stay: Payer: Medicare Other

## 2016-05-06 ENCOUNTER — Encounter: Payer: Medicare Other | Attending: Surgery | Admitting: Surgery

## 2016-05-06 DIAGNOSIS — C786 Secondary malignant neoplasm of retroperitoneum and peritoneum: Secondary | ICD-10-CM | POA: Insufficient documentation

## 2016-05-06 DIAGNOSIS — I110 Hypertensive heart disease with heart failure: Secondary | ICD-10-CM | POA: Insufficient documentation

## 2016-05-06 DIAGNOSIS — C569 Malignant neoplasm of unspecified ovary: Secondary | ICD-10-CM | POA: Insufficient documentation

## 2016-05-06 DIAGNOSIS — Z794 Long term (current) use of insulin: Secondary | ICD-10-CM | POA: Insufficient documentation

## 2016-05-06 DIAGNOSIS — E10621 Type 1 diabetes mellitus with foot ulcer: Secondary | ICD-10-CM | POA: Insufficient documentation

## 2016-05-06 DIAGNOSIS — L89623 Pressure ulcer of left heel, stage 3: Secondary | ICD-10-CM | POA: Insufficient documentation

## 2016-05-06 DIAGNOSIS — Z7952 Long term (current) use of systemic steroids: Secondary | ICD-10-CM | POA: Insufficient documentation

## 2016-05-06 DIAGNOSIS — C787 Secondary malignant neoplasm of liver and intrahepatic bile duct: Secondary | ICD-10-CM | POA: Insufficient documentation

## 2016-05-06 DIAGNOSIS — M069 Rheumatoid arthritis, unspecified: Secondary | ICD-10-CM | POA: Insufficient documentation

## 2016-05-06 DIAGNOSIS — I2510 Atherosclerotic heart disease of native coronary artery without angina pectoris: Secondary | ICD-10-CM | POA: Insufficient documentation

## 2016-05-06 DIAGNOSIS — Z9221 Personal history of antineoplastic chemotherapy: Secondary | ICD-10-CM | POA: Insufficient documentation

## 2016-05-06 DIAGNOSIS — Z79899 Other long term (current) drug therapy: Secondary | ICD-10-CM | POA: Insufficient documentation

## 2016-05-06 DIAGNOSIS — E1040 Type 1 diabetes mellitus with diabetic neuropathy, unspecified: Secondary | ICD-10-CM | POA: Insufficient documentation

## 2016-05-06 DIAGNOSIS — I5032 Chronic diastolic (congestive) heart failure: Secondary | ICD-10-CM | POA: Insufficient documentation

## 2016-05-06 DIAGNOSIS — Z87891 Personal history of nicotine dependence: Secondary | ICD-10-CM | POA: Insufficient documentation

## 2016-05-06 MED ORDER — TBO-FILGRASTIM 300 MCG/0.5ML ~~LOC~~ SOSY
300.0000 ug | PREFILLED_SYRINGE | Freq: Once | SUBCUTANEOUS | Status: DC
  Filled 2016-05-06: qty 0.5

## 2016-05-06 MED ORDER — FILGRASTIM 300 MCG/0.5ML IJ SOSY: 300.0000 ug | PREFILLED_SYRINGE | Freq: Once | INTRAMUSCULAR | Status: DC

## 2016-05-06 NOTE — Progress Notes (Signed)
Patient mentions the increase in body aches since taking Granix injection 2 days in a row.  She is not able to remove her coat without assistance so I went to discuss with Dr. Mike Gip.  Dr. Mike Gip came over to the infusion area to speak to the patient about her body discomfort.  After evaluating patient's knees, elbows, hands (where she has the most pain), and reviewing lab results from Tuesday MD suggest to hold the Granix injection today.  Patient is to also take Prednisone 5mg , that she has at home for RA, and call the office with any fevers or chills.

## 2016-05-07 NOTE — Progress Notes (Signed)
[name] ([id number]) Visit Report for 05/06/2016 Arrival Information Details Patient Name: [name] Date of Service: 05/06/2016 10:45 AM Medical Record Number: [id number] Patient Account Number: [id number] Date of Birth/Sex: [birth year] (70 y.o. Female) Treating RN: [name] Primary Care Physician: [name] Other Clinician: Referring Physician: Caryl Bis, [name] Treating Physician/Extender: [name] in Treatment: 18 Visit Information History Since Last Visit All ordered tests and consults were completed: No Patient Arrived: Cane Added or deleted any medications: No Arrival Time: 10:39 Any new allergies or adverse reactions: No Accompanied By: hubby Had a fall or experienced change in No Transfer Assistance: None activities of daily living that may affect Patient Identification Verified: Yes risk of falls: Secondary Verification Process Yes Signs or symptoms of abuse/neglect since last No Completed: visito Patient Requires Transmission- No Hospitalized since last visit: No Based Precautions: Has Dressing in Place as Prescribed: Yes Patient Has Alerts: Yes Pain Present Now: No Patient Alerts: Patient on Blood Thinner eliquis BID DMII Electronic Signature(s) Signed: 05/06/2016 5:32:07 PM By: [name] BSN, RN Entered By: [name] on 05/06/2016 10:39:56 [name] ([id number]) -------------------------------------------------------------------------------- Clinic Level of Care Assessment Details Patient Name: [name] Date of Service: 05/06/2016 10:45 AM Medical Record Number: [id number] Patient Account Number: [id number] Date of Birth/Sex: 08/27/1945 (70 y.o. Female) Treating RN: Afful, RN, BSN, [name] Primary Care Physician: [name] Other Clinician: Referring Physician: Caryl Bis, [name] Treating Physician/Extender: [name] in Treatment: 18 Clinic Level of Care Assessment Items TOOL 4  Quantity Score []  - Use when only an EandM is performed on FOLLOW-UP visit 0 ASSESSMENTS - Nursing Assessment / Reassessment X - Reassessment of Co-morbidities (includes updates in patient status) 1 10 X - Reassessment of Adherence to Treatment Plan 1 5 ASSESSMENTS - Wound and Skin Assessment / Reassessment X - Simple Wound Assessment / Reassessment - one wound 1 5 []  - Complex Wound Assessment / Reassessment - multiple wounds 0 []  - Dermatologic / Skin Assessment (not related to wound area) 0 ASSESSMENTS - Focused Assessment []  - Circumferential Edema Measurements - multi extremities 0 []  - Nutritional Assessment / Counseling / Intervention 0 X - Lower Extremity Assessment (monofilament, tuning fork, pulses) 1 5 []  - Peripheral Arterial Disease Assessment (using hand held doppler) 0 ASSESSMENTS - Ostomy and/or Continence Assessment and Care []  - Incontinence Assessment and Management 0 []  - Ostomy Care Assessment and Management (repouching, etc.) 0 PROCESS - Coordination of Care X - Simple Patient / Family Education for ongoing care 1 15 []  - Complex (extensive) Patient / Family Education for ongoing care 0 []  - Staff obtains Programmer, systems, Records, Test Results / Process Orders 0 []  - Staff telephones HHA, Nursing Homes / Clarify orders / etc 0 []  - Routine Transfer to another Facility (non-emergent condition) 0 [name]. ([id number]) []  - Routine Hospital Admission (non-emergent condition) 0 []  - New Admissions / Biomedical engineer / Ordering NPWT, Apligraf, etc. 0 []  - Emergency Hospital Admission (emergent condition) 0 []  - Simple Discharge Coordination 0 []  - Complex (extensive) Discharge Coordination 0 PROCESS - Special Needs []  - Pediatric / Minor Patient Management 0 []  - Isolation Patient Management 0 []  - Hearing / Language / Visual special needs 0 []  - Assessment of Community assistance (transportation, D/C planning, etc.) 0 []  - Additional assistance / Altered  mentation 0 []  - Support Surface(s) Assessment (bed, cushion, seat, etc.) 0 INTERVENTIONS - Wound Cleansing / Measurement X - Simple Wound Cleansing - one wound 1 5 []  -  Complex Wound Cleansing - multiple wounds 0 X - Wound Imaging (photographs - any number of wounds) 1 5 []  - Wound Tracing (instead of photographs) 0 X - Simple Wound Measurement - one wound 1 5 []  - Complex Wound Measurement - multiple wounds 0 INTERVENTIONS - Wound Dressings X - Small Wound Dressing one or multiple wounds 1 10 []  - Medium Wound Dressing one or multiple wounds 0 []  - Large Wound Dressing one or multiple wounds 0 []  - Application of Medications - topical 0 []  - Application of Medications - injection 0 INTERVENTIONS - Miscellaneous []  - External ear exam 0 [name]. ([id number]) []  - Specimen Collection (cultures, biopsies, blood, body fluids, etc.) 0 []  - Specimen(s) / Culture(s) sent or taken to Lab for analysis 0 []  - Patient Transfer (multiple staff / Harrel Lemon Lift / Similar devices) 0 []  - Simple Staple / Suture removal (25 or less) 0 []  - Complex Staple / Suture removal (26 or more) 0 []  - Hypo / Hyperglycemic Management (close monitor of Blood Glucose) 0 []  - Ankle / Brachial Index (ABI) - do not check if billed separately 0 X - Vital Signs 1 5 Has the patient been seen at the hospital within the last three years: Yes Total Score: 70 Level Of Care: New/Established - Level 2 Electronic Signature(s) Signed: 05/06/2016 5:32:07 PM By: [name] BSN, RN Entered By: [name] on 05/06/2016 17:22:30 [name] ([id number]) -------------------------------------------------------------------------------- Encounter Discharge Information Details Patient Name: [name] Date of Service: 05/06/2016 10:45 AM Medical Record Number: [id number] Patient Account Number: [id number] Date of Birth/Sex: 02/29/1946 (70 y.o. Female) Treating RN: [name] Primary Care Physician:  [name] Other Clinician: Referring Physician: Caryl Bis, [name] Treating Physician/Extender: [name] in Treatment: 18 Encounter Discharge Information Items Discharge Pain Level: 0 Discharge Condition: Stable Ambulatory Status: Cane Discharge Destination: Home Transportation: Private Auto Accompanied By: Micheline Rough Schedule Follow-up Appointment: No Medication Reconciliation completed No and provided to Patient/Care Faithlyn Recktenwald: Provided on Clinical Summary of Care: 05/06/2016 Form Type Recipient Paper Patient SR Electronic Signature(s) Signed: 05/06/2016 5:23:54 PM By: [name] BSN, RN Previous Signature: 05/06/2016 11:10:12 AM Version By: Ruthine Dose Entered By: [name] on 05/06/2016 17:23:53 [name] ([id number]) -------------------------------------------------------------------------------- Lower Extremity Assessment Details Patient Name: [name] Date of Service: 05/06/2016 10:45 AM Medical Record Number: [id number] Patient Account Number: [id number] Date of Birth/Sex: 09-Dec-1945 (70 y.o. Female) Treating RN: [name] Primary Care Physician: [name] Other Clinician: Referring Physician: Caryl Bis, [name] Treating Physician/Extender: [name] in Treatment: 18 Edema Assessment Assessed: [Left: No] [Right: No] Edema: [Left: Ye] [Right: s] Vascular Assessment Claudication: Claudication Assessment [Left:None] Pulses: Dorsalis Pedis Palpable: [Left:Yes] Posterior Tibial Extremity colors, hair growth, and conditions: Extremity Color: [Left:Normal] Hair Growth on Extremity: [Left:No] Temperature of Extremity: [Left:Warm] Capillary Refill: [Left:< 3 seconds] Toe Nail Assessment Left: Right: Thick: No Discolored: No Deformed: No Improper Length and Hygiene: No Electronic Signature(s) Signed: 05/06/2016 5:21:54 PM By: [name] BSN, RN Entered By: [name] on 05/06/2016 17:21:54 [name]  ([id number]) -------------------------------------------------------------------------------- Multi Wound Chart Details Patient Name: [name] Date of Service: 05/06/2016 10:45 AM Medical Record Number: [id number] Patient Account Number: [id number] Date of Birth/Sex: 1945-10-24 (70 y.o. Female) Treating RN: [name] Primary Care Physician: [name] Other Clinician: Referring Physician: Caryl Bis, [name] Treating Physician/Extender: [name] in Treatment: 18 Vital Signs Height(in): 63 Pulse(bpm): 83 Weight(lbs): 130 Blood Pressure 131/38 (mmHg): Body Mass Index(BMI): 23  Temperature(F): 98.5 Respiratory Rate 16 (breaths/min): Photos: [2:No Photos] [N/A:N/A] Wound Location: [2:Left Calcaneus] [N/A:N/A] Wounding Event: [2:Pressure Injury] [N/A:N/A] Primary Etiology: [2:Pressure Ulcer] [N/A:N/A] Secondary Etiology: [2:Diabetic Wound/Ulcer of the Lower Extremity] [N/A:N/A] Comorbid History: [2:Cataracts, Type I Diabetes, Rheumatoid Arthritis, Osteoarthritis, Neuropathy, Received Chemotherapy] [N/A:N/A] Date Acquired: [2:07/15/2015] [N/A:N/A] Weeks of Treatment: [2:18] [N/A:N/A] Wound Status: [2:Open] [N/A:N/A] Measurements L x W x D 0.5x0.5x1.2 [N/A:N/A] (cm) Area (cm) : [2:0.196] [N/A:N/A] Volume (cm) : [2:0.236] [N/A:N/A] % Reduction in Area: [2:16.90%] [N/A:N/A] % Reduction in Volume: -151.10% [N/A:N/A] Classification: [2:Category/Stage III] [N/A:N/A] HBO Classification: [2:Grade 1] [N/A:N/A] Exudate Amount: [2:Medium] [N/A:N/A] Exudate Type: [2:Sanguinous] [N/A:N/A] Exudate Color: [2:red] [N/A:N/A] Wound Margin: [2:Flat and Intact] [N/A:N/A] Granulation Amount: [2:Medium (34-66%)] [N/A:N/A] Granulation Quality: [2:Pink, Pale] [N/A:N/A] Necrotic Amount: [2:Small (1-33%)] [N/A:N/A] Exposed Structures: [N/A:N/A] Fat: Yes Fascia: No Tendon: No Muscle: No Joint: No Bone: No Epithelialization: None N/A N/A Periwound Skin Texture:  Induration: Yes N/A N/A Edema: No Excoriation: No Callus: No Crepitus: No Fluctuance: No Friable: No Rash: No Scarring: No Periwound Skin Moist: Yes N/A N/A Moisture: Maceration: No Dry/Scaly: No Periwound Skin Color: Atrophie Blanche: No N/A N/A Cyanosis: No Ecchymosis: No Erythema: No Hemosiderin Staining: No Mottled: No Pallor: No Rubor: No Temperature: No Abnormality N/A N/A Tenderness on Yes N/A N/A Palpation: Wound Preparation: Ulcer Cleansing: N/A N/A Rinsed/Irrigated with Saline Topical Anesthetic Applied: Other: lidocaine 4% Treatment Notes Electronic Signature(s) Signed: 05/06/2016 5:32:07 PM By: [name] BSN, RN Entered By: [name] on 05/06/2016 10:54:30 [name] ([id number]) -------------------------------------------------------------------------------- [place] Details Patient Name: [name] Date of Service: 05/06/2016 10:45 AM Medical Record Number: [id number] Patient Account Number: [id number] Date of Birth/Sex: [birth year] (70 y.o. Female) Treating RN: Afful, RN, BSN, [place] Primary Care Physician: [name] Other Clinician: Referring Physician: Caryl Bis, [name] Treating Physician/Extender: [name] in Treatment: 57 Active Inactive Orientation to the Wound Care Program Nursing Diagnoses: Knowledge deficit related to the wound healing center program Goals: Patient/caregiver will verbalize understanding of the [place] Program Date Initiated: 12/31/2015 Goal Status: Active Interventions: Provide education on orientation to the wound center Notes: Pressure Nursing Diagnoses: Knowledge deficit related to causes and risk factors for pressure ulcer development Knowledge deficit related to management of pressures ulcers Potential for impaired tissue integrity related to pressure, friction, moisture, and shear Goals: Patient will remain free from development of additional pressure  ulcers Date Initiated: 12/31/2015 Goal Status: Active Patient will remain free of pressure ulcers Date Initiated: 12/31/2015 Goal Status: Active Patient/caregiver will verbalize risk factors for pressure ulcer development Date Initiated: 12/31/2015 Goal Status: Active Patient/caregiver will verbalize understanding of pressure ulcer management Date Initiated: 12/31/2015 Goal Status: Active Interventions: TILISA, DETORRES ([id number]) Assess: immobility, friction, shearing, incontinence upon admission and as needed Assess offloading mechanisms upon admission and as needed Assess potential for pressure ulcer upon admission and as needed Provide education on pressure ulcers Treatment Activities: Patient referred for pressure reduction/relief devices : 12/31/2015 Notes: Wound/Skin Impairment Nursing Diagnoses: Impaired tissue integrity Knowledge deficit related to smoking impact on wound healing Knowledge deficit related to ulceration/compromised skin integrity Goals: Patient/caregiver will verbalize understanding of skin care regimen Date Initiated: 12/31/2015 Goal Status: Active Ulcer/skin breakdown will have a volume reduction of 30% by week 4 Date Initiated: 12/31/2015 Goal Status: Active Ulcer/skin breakdown will have a volume reduction of 50% by week 8 Date Initiated: 12/31/2015 Goal Status: Active Ulcer/skin breakdown will have a volume reduction of 80% by week 12 Date Initiated: 12/31/2015 Goal Status: Active Ulcer/skin breakdown  will heal within 14 weeks Date Initiated: 12/31/2015 Goal Status: Active Interventions: Assess patient/caregiver ability to obtain necessary supplies Assess patient/caregiver ability to perform ulcer/skin care regimen upon admission and as needed Assess ulceration(s) every visit Provide education on ulcer and skin care Treatment Activities: Skin care regimen initiated : 12/31/2015 Topical wound management initiated : 12/31/2015 Notes: LAMONT, RUNNER ([id number]) Electronic Signature(s) Signed: 05/06/2016 5:32:07 PM By: [name] BSN, RN Entered By: [name] on 05/06/2016 10:54:11 [name] ([id number]) -------------------------------------------------------------------------------- Pain Assessment Details Patient Name: [name] Date of Service: 05/06/2016 10:45 AM Medical Record Number: [id number] Patient Account Number: [id number] Date of Birth/Sex: 20-Dec-1945 (70 y.o. Female) Treating RN: [name] Primary Care Physician: [name] Other Clinician: Referring Physician: Caryl Bis, [name] Treating Physician/Extender: [name] in Treatment: 18 Active Problems Location of Pain Severity and Description of Pain Patient Has Paino No Site Locations With Dressing Change: No Pain Management and Medication Current Pain Management: Electronic Signature(s) Signed: 05/06/2016 5:32:07 PM By: [name] BSN, RN Entered By: [name] on 05/06/2016 10:41:42 [name] ([id number]) -------------------------------------------------------------------------------- Patient/Caregiver Education Details Patient Name: [name] Date of Service: 05/06/2016 10:45 AM Medical Record Number: [id number] Patient Account Number: [id number] Date of Birth/Gender: November 29, 1945 (70 y.o. Female) Treating RN: [name] Primary Care Physician: [name] Other Clinician: Referring Physician: Caryl Bis, [name] Treating Physician/Extender: [name] in Treatment: 46 Education Assessment Education Provided To: Patient Education Topics Provided Pressure: Methods: Explain/Verbal Responses: State content correctly Welcome To [facility]: Methods: Explain/Verbal Responses: State content correctly Wound/Skin Impairment: Methods: Explain/Verbal Responses: State content correctly Electronic Signature(s) Signed: 05/06/2016 5:32:07 PM By: [name] BSN, RN Entered By:  [name] on 05/06/2016 17:24:13 [name] ([id number]) -------------------------------------------------------------------------------- Wound Assessment Details Patient Name: [name] Date of Service: 05/06/2016 10:45 AM Medical Record Number: [id number] Patient Account Number: [id number] Date of Birth/Sex: [birth year] (70 y.o. Female) Treating RN: Afful, RN, BSN, [place] Primary Care Physician: [name] Other Clinician: Referring Physician: Caryl Bis, [name] Treating Physician/Extender: [name] in Treatment: 18 Wound Status Wound Number: 2 Primary Pressure Ulcer Etiology: Wound Location: Left Calcaneus Secondary Diabetic Wound/Ulcer of the Lower Wounding Event: Pressure Injury Etiology: Extremity Date Acquired: 07/15/2015 Wound Open Weeks Of Treatment: 18 Status: Clustered Wound: No Comorbid Cataracts, Type I Diabetes, History: Rheumatoid Arthritis, Osteoarthritis, Neuropathy, Received Chemotherapy Wound Measurements Length: (cm) 0.5 Width: (cm) 0.5 Depth: (cm) 1.2 Area: (cm) 0.196 Volume: (cm) 0.236 % Reduction in Area: 16.9% % Reduction in Volume: -151.1% Epithelialization: None Tunneling: No Undermining: No Wound Description Classification: Category/Stage III Foul Odor A Diabetic Severity (Wagner): Grade 1 Wound Margin: Flat and Intact Exudate Amount: Medium Exudate Type: Sanguinous Exudate Color: red fter Cleansing: No Wound Bed Granulation Amount: Medium (34-66%) Exposed Structure Granulation Quality: Pink, Pale Fascia Exposed: No Necrotic Amount: Small (1-33%) Fat Layer Exposed: Yes Necrotic Quality: Adherent Slough Tendon Exposed: No Muscle Exposed: No Joint Exposed: No Bone Exposed: No Periwound Skin Texture Texture Color No Abnormalities Noted: No No Abnormalities Noted: No Callus: No Atrophie Blanche: No DOMINIC, HINOTE ([id number]) Crepitus: No Cyanosis: No Excoriation: No Ecchymosis: No Fluctuance:  No Erythema: No Friable: No Hemosiderin Staining: No Induration: Yes Mottled: No Localized Edema: No Pallor: No Rash: No Rubor: No Scarring: No Temperature / Pain Moisture Temperature: No Abnormality No Abnormalities Noted: No Tenderness on Palpation: Yes Dry / Scaly: No Maceration: No Moist: Yes Wound Preparation Ulcer Cleansing: Rinsed/Irrigated with Saline Topical Anesthetic Applied: Other: lidocaine  4%, Treatment Notes Wound #2 (Left Calcaneus) 1. Cleansed with: Clean wound with Normal Saline 4. Dressing Applied: Other dressing (specify in notes) 5. Secondary Dressing Applied Gauze and Kerlix/Conform Notes Endoform Electronic Signature(s) Signed: 05/06/2016 5:32:07 PM By: [name] BSN, RN Entered By: [name] on 05/06/2016 10:48:52 [name] ([id number]) -------------------------------------------------------------------------------- Vitals Details Patient Name: [name] Date of Service: 05/06/2016 10:45 AM Medical Record Number: [id number] Patient Account Number: [id number] Date of Birth/Sex: 07/28/1945 (70 y.o. Female) Treating RN: Afful, RN, BSN, [place] Primary Care Physician: [name] Other Clinician: Referring Physician: Caryl Bis, [name] Treating Physician/Extender: [name] in Treatment: 18 Vital Signs Time Taken: 10:41 Temperature (F): 98.5 Height (in): 63 Pulse (bpm): 83 Weight (lbs): 130 Respiratory Rate (breaths/min): 16 Body Mass Index (BMI): 23 Blood Pressure (mmHg): 131/38 Reference Range: 80 - 120 mg / dl Electronic Signature(s) Signed: 05/06/2016 5:32:07 PM By: [name] BSN, RN Entered By: [name] on 05/06/2016 10:42:14

## 2016-05-07 NOTE — Progress Notes (Signed)
TABBATHA, FLOSS ([id number]) Visit Report for 05/06/2016 Chief Complaint Document Details Patient Name: [name], [name] Date of Service: 05/06/2016 10:45 AM Medical Record Patient Account Number: [id number] Number: Afful, RN, BSN, Treating RN: August 01, 1945 [phone] y.o. [name] Date of Birth/Sex: Female) Other Clinician: Primary Care Physician: Caryl Bis, ERIC Treating Christin Fudge Referring Physician: Caryl Bis, ERIC Physician/Extender: Suella Grove in Treatment: 18 Information Obtained from: Patient Chief Complaint [name] presents for evaluation of her left posterior heel pressure ulcer Electronic Signature(s) Signed: 05/06/2016 11:12:52 AM By: Christin Fudge MD, FACS Entered By: Christin Fudge on 05/06/2016 11:12:52 [name] ([id number]) -------------------------------------------------------------------------------- HPI Details Patient Name: [name] Date of Service: 05/06/2016 10:45 AM Medical Record Patient Account Number: [id number] Number: Afful, RN, BSN, Treating RN: March 01, 1946 (71 y.o. [name] Date of Birth/Sex: Female) Other Clinician: Primary Care Physician: Caryl Bis, ERIC Treating Christin Fudge Referring Physician: Caryl Bis, ERIC Physician/Extender: Suella Grove in Treatment: 18 History of Present Illness Location: right heel Quality: Patient reports experiencing a dull pain to affected area(s). Severity: Patient states wound are getting worse. Duration: Patient has had the wound for <5 months prior to presenting for treatment Timing: Pain in wound is Intermittent (comes and goes Context: The wound appeared gradually over time Modifying Factors: Consults to this date include: local care was done as per the medication given by the PCP which may have been medihoney Associated Signs and Symptoms: Patient reports having difficulty standing for long periods. 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 In the past she was seen for a decubitus ulcer on her right gluteal area. She is known to have diabetes mellitus type 1 and her most recent A1c was 7.2% Past medical history significant for leukopenia, cholelithiasis, hypertension, chronic diastolic CHF, coronary artery disease,type 1 diabetes mellitus, rheumatoid arthritis, collagen vascular disease, ovarian cancer, status post CABG in 2014 and status post Port-A-Cath insertion by Dr. Leotis Pain in October 2016. She has quit smoking about 20 years ago. She was advised to use Medihoney with calcium alginate pads to be applied over the wound. 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 01/07/2016 -- x-ray of the left heel done -- IMPRESSION: No radiographic evidence of osteomyelitis. If this remains a clinical concern, recommend MR imaging. 02/05/16: returns today for f/u. denies fever, chills, body aches or malaise. no interval changes regarding health status. 02/12/16: pt is doing well. wound has improved. no s/s of infection. no systemic s/s of infection. She reports will be on vacation next week, and requests appointment in 2 weeks. 03/18/2016 - is going to be starting her chemotherapy this coming Monday and will have cycles every 3 weeks 04-01-16 [name], accompanied by her husband, presents for evaluation of her left posterior heel stage III pressure ulcer. She states that she started chemotherapy last week. Her chemotherapy is on a cycle of one week on 3 weeks off, with her next  treatment scheduled for December 11. There has been an increase [name]. ([id number]) in drainage since her last appointment. She denies any  pain. They have been using PolyMem silver rope. 04/22/16 - [name] presents today, accompanied by her husband, for a violation of her left posterior low heel pressure ulcer. She continues to receive chemotherapy, with the exception of last week due to neutropenia. Her next scheduled chemotherapy is on January 2. She states that her chemotherapy schedule is 1 day on 8 days off. She and her husband deny any overt changes to her wound, denies pain, denies any increased drainage. 04/29/2016 -- the copayment for the skin substitute is about $300 a week and they have declined use of this material. Electronic Signature(s) Signed: 05/06/2016 11:12:59 AM By: Christin Fudge MD, FACS Entered By: Christin Fudge on 05/06/2016 11:12:59 [name] ([id number]) -------------------------------------------------------------------------------- Physical Exam Details Patient Name: [name] Date of Service: 05/06/2016 10:45 AM Medical Record Patient Account Number: [id number] Number: Afful, RN, BSN, Treating RN: 24-May-1945 (71 y.o. [name] Date of Birth/Sex: Female) Other Clinician: Primary Care Physician: Caryl Bis, ERIC Treating Christin Fudge Referring Physician: Caryl Bis, ERIC Physician/Extender: Suella Grove in Treatment: 18 Constitutional . Pulse regular. Respirations normal and unlabored. Afebrile. . Eyes Nonicteric. Reactive to light. Ears, Nose, Mouth, and Throat Lips, teeth, and gums WNL.Marland Kitchen Moist mucosa without lesions. Neck supple and nontender. No palpable supraclavicular or cervical adenopathy. Normal sized without goiter. Respiratory WNL. No retractions.. Cardiovascular Pedal Pulses WNL. No clubbing, cyanosis or edema. Lymphatic No adneopathy. No adenopathy. No adenopathy. Musculoskeletal Adexa without tenderness or  enlargement.. Digits and nails w/o clubbing, cyanosis, infection, petechiae, ischemia, or inflammatory conditions.. Integumentary (Hair, Skin) No suspicious lesions. No crepitus or fluctuance. No peri-wound warmth or erythema. No masses.Marland Kitchen Psychiatric Judgement and insight Intact.. No evidence of depression, anxiety, or agitation.. Notes the wound is clean and there is no evidence of probing down to bone. I have place some end of form into the wound and tacked it in place and will use a heel cup appropriately and see her back next week. Electronic Signature(s) Signed: 05/06/2016 11:14:13 AM By: Christin Fudge MD, FACS Entered By: Christin Fudge on 05/06/2016 11:14:12 [name] ([id number]) -------------------------------------------------------------------------------- Physician Orders Details Patient Name: [name] Date of Service: 05/06/2016 10:45 AM Medical Record Patient Account Number: [id number] Number: Afful, RN, BSN, Treating RN: 05-Sep-1945 (71 y.o. [name] Date of Birth/Sex: Female) Other Clinician: Primary Care Physician: Caryl Bis, ERIC Treating Christin Fudge Referring Physician: Caryl Bis, ERIC Physician/Extender: Suella Grove in Treatment: 18 Verbal / Phone Orders: Yes Clinician: Afful, RN, BSN, Rita Read Back and Verified: Yes Diagnosis Coding Wound Cleansing Wound #2 Left Calcaneus o Clean wound with Normal Saline. Primary Wound Dressing Wound #2 Left Calcaneus o Other: - Endoform Secondary Dressing Wound #2 Left Calcaneus o Dry Gauze - 2x2 o Boardered Foam Dressing Dressing Change Frequency Wound #2 Left Calcaneus o Change dressing every week - can change it if it gets wet and /or comes off. Cut another strip and insert Follow-up Appointments Wound #2 Left Calcaneus o Return Appointment in 1 week. Edema Control Wound #2 Left Calcaneus o Patient to wear own compression stockings - Patient instructed to wear 20-30 mmHg  gradient o Elevate legs to the level of the heart and pump ankles as often as possible o Support Garment 20-30 mm/Hg pressure to: Additional Orders / Instructions Wound #2 Left Calcaneus o Activity as tolerated [name], [name] ([id number]) Electronic Signature(s) Signed: 05/06/2016 4:52:44 PM By: Christin Fudge MD, FACS Entered By: Christin Fudge on 05/06/2016 11:15:11 [name] ([id number]) -------------------------------------------------------------------------------- Problem List Details Patient Name: [name] Date of  Service: 05/06/2016 10:45 AM Medical Record Patient Account Number: [id number] Number: Afful, RN, BSN, Treating RN: [birth year] [phone] y.o. [name] Date of Birth/Sex: Female) Other Clinician: Primary Care Physician: Caryl Bis, ERIC Treating Christin Fudge Referring Physician: Caryl Bis, ERIC Physician/Extender: Suella Grove in Treatment: 18 Active Problems ICD-10 Encounter Code Description Active Date Diagnosis E10.621 Type 1 diabetes mellitus with foot ulcer 12/31/2015 Yes L89.623 Pressure ulcer of left heel, stage 3 12/31/2015 Yes Z92.21 Personal history of antineoplastic chemotherapy 12/31/2015 Yes Z79.52 Long term (current) use of systemic steroids 12/31/2015 Yes Inactive Problems Resolved Problems Electronic Signature(s) Signed: 05/06/2016 11:12:39 AM By: Christin Fudge MD, FACS Entered By: Christin Fudge on 05/06/2016 11:12:38 [name] ([id number]) -------------------------------------------------------------------------------- Progress Note Details Patient Name: [name] Date of Service: 05/06/2016 10:45 AM Medical Record Patient Account Number: [id number] Number: Afful, RN, BSN, Treating RN: 1945/12/22 (71 y.o. [name] Date of Birth/Sex: Female) Other Clinician: Primary Care Physician: Caryl Bis, ERIC Treating Christin Fudge Referring Physician: Caryl Bis, ERIC Physician/Extender: Suella Grove in Treatment:  18 Subjective Chief Complaint Information obtained from Patient Mrs. Holtmeyer presents for evaluation of her left posterior heel pressure ulcer History of Present Illness (HPI) The following HPI elements were documented for the patient's wound: Location: right heel Quality: Patient reports experiencing a dull pain to affected area(s). Severity: Patient states wound are getting worse. Duration: Patient has had the wound for Timing: Pain in wound is Intermittent (comes and goes Context: The wound appeared gradually over time Modifying Factors: Consults to this date include: local care was done as per the medication given by the PCP which may have been medihoney Associated Signs and Symptoms: Patient reports having difficulty standing for long periods. 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 In the past she was seen for a decubitus ulcer on her right gluteal area. She is known to have diabetes mellitus type 1 and her most recent A1c was 7.2% Past medical history significant for leukopenia, cholelithiasis, hypertension, chronic diastolic CHF, coronary artery disease,type 1 diabetes mellitus, rheumatoid arthritis, collagen vascular disease, ovarian cancer, status post CABG in 2014 and status post Port-A-Cath insertion by Dr. Leotis Pain in October 2016. She has quit smoking about 20 years ago. She was advised to use Medihoney with calcium alginate pads to be applied over the wound. 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 01/07/2016 -- x-ray of the left heel done -- IMPRESSION: No radiographic evidence of osteomyelitis. If this remains a clinical concern, recommend MR imaging. 02/05/16: returns today for f/u. denies fever, chills, body aches or malaise. no interval changes regarding health status. 02/12/16: pt is doing well. wound has improved. no s/s of infection. no systemic s/s of infection. She reports [name], [name]. ([id number]) will be on vacation next week, and requests appointment in 2 weeks. 03/18/2016 - is going to be starting her chemotherapy this coming Monday and will have cycles every 3 weeks 04-01-16 [name], accompanied by her husband, presents for evaluation of her left posterior heel stage III pressure ulcer. She states that she started chemotherapy last  week. Her chemotherapy is on a cycle of one week on 3 weeks off, with her next treatment scheduled for December 11. There has been an increase in drainage since her last appointment. She denies any pain. They have been using PolyMem silver rope. 04/22/16 - [name] presents today, accompanied by her husband, for a violation of her left posterior low heel pressure ulcer. She continues to receive chemotherapy, with the exception of last week due to neutropenia. Her next scheduled chemotherapy is on January 2. She states that her chemotherapy schedule is 1 day on 8 days off. She and her husband deny any overt changes to her wound, denies pain, denies any increased drainage. 04/29/2016 -- the copayment for the skin substitute is about $300 a week and they have declined use of this material. Objective Constitutional Pulse regular. Respirations normal and unlabored. Afebrile. Vitals Time Taken: 10:41 AM, Height: 63 in, Weight: 130 lbs, BMI: 23, Temperature: 98.5 F, Pulse: 83 bpm, Respiratory Rate: 16 breaths/min, Blood Pressure: 131/38 mmHg. Eyes Nonicteric. Reactive to light. Ears, Nose, Mouth, and Throat Lips, teeth, and  gums WNL.Marland Kitchen Moist mucosa without lesions. Neck supple and nontender. No palpable supraclavicular or cervical adenopathy. Normal sized without goiter. Respiratory WNL. No retractions.. Cardiovascular Pedal Pulses WNL. No clubbing, cyanosis or edema. Lymphatic No adneopathy. No adenopathy. No adenopathy. Musculoskeletal DRAIZY, DELASANCHA ([id number]) Adexa without tenderness or enlargement.. Digits and nails w/o clubbing, cyanosis, infection, petechiae, ischemia, or inflammatory conditions.Marland Kitchen Psychiatric Judgement and insight Intact.. No evidence of depression, anxiety, or agitation.. General Notes: the wound is clean and there is no evidence of probing down to bone. I have place some end of form into the wound and tacked it in place and will use a heel cup appropriately and see her back next week. Integumentary (Hair, Skin) No suspicious lesions. No crepitus or fluctuance. No peri-wound warmth or erythema. No masses.. Wound #2 status is Open. Original cause of wound was Pressure Injury. The wound is located on the Left Calcaneus. The wound measures 0.5cm length x 0.5cm width x 1.2cm depth; 0.196cm^2 area and 0.236cm^3 volume. There is fat exposed. There is no tunneling or undermining noted. There is a medium amount of sanguinous drainage noted. The wound margin is flat and intact. There is medium (34-66%) pink, pale granulation within the wound bed. There is a small (1-33%) amount of necrotic tissue within the wound bed including Adherent Slough. The periwound skin appearance exhibited: Induration, Moist. The periwound skin appearance did not exhibit: Callus, Crepitus, Excoriation, Fluctuance, Friable, Localized Edema, Rash, Scarring, Dry/Scaly, Maceration, Atrophie Blanche, Cyanosis, Ecchymosis, Hemosiderin Staining, Mottled, Pallor, Rubor, Erythema. Periwound temperature was noted as No Abnormality. The periwound has tenderness on palpation. Assessment Active Problems ICD-10 E10.621 -  Type 1 diabetes mellitus with foot ulcer L89.623 - Pressure ulcer of left heel, stage 3 Z92.21 - Personal history of antineoplastic chemotherapy Z79.52 - Long term (current) use of systemic steroids I have packed Endoform into the wound and applied an appropriate dressing over this. She will change it if required and see me back next week. she has a bit of lower extremity lymphedema and I was asked to develop compression stockings and to be careful about offloading. Nutrition, vitamins have also been discussed with her. She is still taking her chemotherapy currently. Plan BREIGHLYNN, MADAN. ([id number]) Wound Cleansing: Wound #2 Left Calcaneus: Clean wound with Normal Saline. Primary Wound Dressing: Wound #2 Left Calcaneus: Other: - Endoform Secondary Dressing: Wound #2 Left Calcaneus: Dry Gauze - 2x2 Boardered  Foam Dressing Dressing Change Frequency: Wound #2 Left Calcaneus: Change dressing every week - can change it if it gets wet and /or comes off. Cut another strip and insert Follow-up Appointments: Wound #2 Left Calcaneus: Return Appointment in 1 week. Edema Control: Wound #2 Left Calcaneus: Patient to wear own compression stockings - Patient instructed to wear 20-30 mmHg gradient Elevate legs to the level of the heart and pump ankles as often as possible Support Garment 20-30 mm/Hg pressure to: Additional Orders / Instructions: Wound #2 Left Calcaneus: Activity as tolerated I have packed Endoform into the wound and applied an appropriate dressing over this. She will change it if required and see me back next week. she has a bit of lower extremity lymphedema and I was asked to develop compression stockings and to be careful about offloading. Nutrition, vitamins have also been discussed with her. She is still taking her chemotherapy currently. Electronic Signature(s) Signed: 05/06/2016 11:16:37 AM By: Christin Fudge MD, FACS Entered By: Christin Fudge on 05/06/2016  11:16:37 [name] ([id number]) -------------------------------------------------------------------------------- SuperBill Details Patient Name: [name] Date of Service: 05/06/2016 Medical Record Patient Account Number: [id number] Number: Afful, RN, BSN, Treating RN: 10-01-1945 (71 y.o. [name] Date of Birth/Sex: Female) Other Clinician: Primary Care Physician: Caryl Bis, ERIC Treating Christin Fudge Referring Physician: Caryl Bis, ERIC Physician/Extender: Suella Grove in Treatment: 18 Diagnosis Coding ICD-10 Codes Code Description E10.621 Type 1 diabetes mellitus with foot ulcer L89.623 Pressure ulcer of left heel, stage 3 Z92.21 Personal history of antineoplastic chemotherapy Z79.52 Long term (current) use of systemic steroids Facility Procedures CPT4 Code: [id number] Description: [phone] - WOUND CARE VISIT-LEV 2 EST PT Modifier: Quantity: 1 Physician Procedures CPT4 Code: [id number] Description: [id number] - WC PHYS LEVEL 3 - EST PT ICD-10 Description Diagnosis E10.621 Type 1 diabetes mellitus with foot ulcer Z92.21 Personal history of antineoplastic chemothera L89.623 Pressure ulcer of left heel, stage 3 Modifier: py Quantity: 1 Electronic Signature(s) Signed: 05/06/2016 5:22:45 PM By: Regan Lemming BSN, RN Previous Signature: 05/06/2016 11:17:46 AM Version By: Christin Fudge MD, FACS Entered By: Regan Lemming on 05/06/2016 17:22:45

## 2016-05-10 ENCOUNTER — Inpatient Hospital Stay: Payer: Medicare Other

## 2016-05-10 ENCOUNTER — Other Ambulatory Visit: Payer: Self-pay | Admitting: Hematology and Oncology

## 2016-05-10 ENCOUNTER — Encounter: Payer: Self-pay | Admitting: Hematology and Oncology

## 2016-05-10 ENCOUNTER — Inpatient Hospital Stay (HOSPITAL_BASED_OUTPATIENT_CLINIC_OR_DEPARTMENT_OTHER): Payer: Medicare Other | Admitting: Hematology and Oncology

## 2016-05-10 VITALS — BP 104/62 | HR 86 | Temp 96.0°F | Resp 18 | Wt 137.8 lb

## 2016-05-10 DIAGNOSIS — Z9641 Presence of insulin pump (external) (internal): Secondary | ICD-10-CM

## 2016-05-10 DIAGNOSIS — Z79899 Other long term (current) drug therapy: Secondary | ICD-10-CM

## 2016-05-10 DIAGNOSIS — Z862 Personal history of diseases of the blood and blood-forming organs and certain disorders involving the immune mechanism: Secondary | ICD-10-CM

## 2016-05-10 DIAGNOSIS — C801 Malignant (primary) neoplasm, unspecified: Secondary | ICD-10-CM

## 2016-05-10 DIAGNOSIS — Z8701 Personal history of pneumonia (recurrent): Secondary | ICD-10-CM

## 2016-05-10 DIAGNOSIS — Z794 Long term (current) use of insulin: Secondary | ICD-10-CM

## 2016-05-10 DIAGNOSIS — I2510 Atherosclerotic heart disease of native coronary artery without angina pectoris: Secondary | ICD-10-CM

## 2016-05-10 DIAGNOSIS — C569 Malignant neoplasm of unspecified ovary: Secondary | ICD-10-CM

## 2016-05-10 DIAGNOSIS — C786 Secondary malignant neoplasm of retroperitoneum and peritoneum: Secondary | ICD-10-CM

## 2016-05-10 DIAGNOSIS — R918 Other nonspecific abnormal finding of lung field: Secondary | ICD-10-CM

## 2016-05-10 DIAGNOSIS — K219 Gastro-esophageal reflux disease without esophagitis: Secondary | ICD-10-CM

## 2016-05-10 DIAGNOSIS — E108 Type 1 diabetes mellitus with unspecified complications: Secondary | ICD-10-CM

## 2016-05-10 DIAGNOSIS — C787 Secondary malignant neoplasm of liver and intrahepatic bile duct: Secondary | ICD-10-CM

## 2016-05-10 DIAGNOSIS — I252 Old myocardial infarction: Secondary | ICD-10-CM

## 2016-05-10 DIAGNOSIS — Z87891 Personal history of nicotine dependence: Secondary | ICD-10-CM

## 2016-05-10 DIAGNOSIS — Z90710 Acquired absence of both cervix and uterus: Secondary | ICD-10-CM

## 2016-05-10 DIAGNOSIS — Z7901 Long term (current) use of anticoagulants: Secondary | ICD-10-CM

## 2016-05-10 DIAGNOSIS — Z808 Family history of malignant neoplasm of other organs or systems: Secondary | ICD-10-CM

## 2016-05-10 DIAGNOSIS — G629 Polyneuropathy, unspecified: Secondary | ICD-10-CM

## 2016-05-10 DIAGNOSIS — M069 Rheumatoid arthritis, unspecified: Secondary | ICD-10-CM

## 2016-05-10 DIAGNOSIS — Z9049 Acquired absence of other specified parts of digestive tract: Secondary | ICD-10-CM

## 2016-05-10 DIAGNOSIS — D709 Neutropenia, unspecified: Secondary | ICD-10-CM

## 2016-05-10 DIAGNOSIS — N1330 Unspecified hydronephrosis: Secondary | ICD-10-CM

## 2016-05-10 DIAGNOSIS — M2550 Pain in unspecified joint: Secondary | ICD-10-CM

## 2016-05-10 DIAGNOSIS — E785 Hyperlipidemia, unspecified: Secondary | ICD-10-CM

## 2016-05-10 DIAGNOSIS — K222 Esophageal obstruction: Secondary | ICD-10-CM

## 2016-05-10 DIAGNOSIS — E10621 Type 1 diabetes mellitus with foot ulcer: Secondary | ICD-10-CM | POA: Diagnosis not present

## 2016-05-10 DIAGNOSIS — I82C11 Acute embolism and thrombosis of right internal jugular vein: Secondary | ICD-10-CM

## 2016-05-10 DIAGNOSIS — I998 Other disorder of circulatory system: Secondary | ICD-10-CM

## 2016-05-10 DIAGNOSIS — Z90722 Acquired absence of ovaries, bilateral: Secondary | ICD-10-CM

## 2016-05-10 DIAGNOSIS — I5032 Chronic diastolic (congestive) heart failure: Secondary | ICD-10-CM

## 2016-05-10 DIAGNOSIS — Z8619 Personal history of other infectious and parasitic diseases: Secondary | ICD-10-CM

## 2016-05-10 DIAGNOSIS — D72819 Decreased white blood cell count, unspecified: Secondary | ICD-10-CM

## 2016-05-10 DIAGNOSIS — Z933 Colostomy status: Secondary | ICD-10-CM

## 2016-05-10 DIAGNOSIS — I10 Essential (primary) hypertension: Secondary | ICD-10-CM

## 2016-05-10 DIAGNOSIS — K449 Diaphragmatic hernia without obstruction or gangrene: Secondary | ICD-10-CM

## 2016-05-10 DIAGNOSIS — M898X9 Other specified disorders of bone, unspecified site: Secondary | ICD-10-CM

## 2016-05-10 DIAGNOSIS — R0602 Shortness of breath: Secondary | ICD-10-CM

## 2016-05-10 DIAGNOSIS — Z86718 Personal history of other venous thrombosis and embolism: Secondary | ICD-10-CM

## 2016-05-10 DIAGNOSIS — E876 Hypokalemia: Secondary | ICD-10-CM

## 2016-05-10 DIAGNOSIS — R188 Other ascites: Secondary | ICD-10-CM

## 2016-05-10 LAB — CBC WITH DIFFERENTIAL/PLATELET
Basophils Absolute: 0 10*3/uL (ref 0–0.1)
Basophils Relative: 1 %
Eosinophils Absolute: 0 10*3/uL (ref 0–0.7)
Eosinophils Relative: 1 %
HCT: 26.2 % — ABNORMAL LOW (ref 35.0–47.0)
Hemoglobin: 9 g/dL — ABNORMAL LOW (ref 12.0–16.0)
Lymphocytes Relative: 24 %
Lymphs Abs: 0.7 10*3/uL — ABNORMAL LOW (ref 1.0–3.6)
MCH: 30.5 pg (ref 26.0–34.0)
MCHC: 34.4 g/dL (ref 32.0–36.0)
MCV: 88.7 fL (ref 80.0–100.0)
Monocytes Absolute: 0.5 10*3/uL (ref 0.2–0.9)
Monocytes Relative: 18 %
Neutro Abs: 1.6 10*3/uL (ref 1.4–6.5)
Neutrophils Relative %: 56 %
Platelets: 153 10*3/uL (ref 150–440)
RBC: 2.95 MIL/uL — ABNORMAL LOW (ref 3.80–5.20)
RDW: 17.6 % — ABNORMAL HIGH (ref 11.5–14.5)
WBC: 2.8 10*3/uL — ABNORMAL LOW (ref 3.6–11.0)

## 2016-05-10 LAB — COMPREHENSIVE METABOLIC PANEL
ALT: 14 U/L (ref 14–54)
AST: 22 U/L (ref 15–41)
Albumin: 3.2 g/dL — ABNORMAL LOW (ref 3.5–5.0)
Alkaline Phosphatase: 65 U/L (ref 38–126)
Anion gap: 8 (ref 5–15)
BUN: 27 mg/dL — ABNORMAL HIGH (ref 6–20)
CO2: 28 mmol/L (ref 22–32)
Calcium: 8.7 mg/dL — ABNORMAL LOW (ref 8.9–10.3)
Chloride: 96 mmol/L — ABNORMAL LOW (ref 101–111)
Creatinine, Ser: 0.83 mg/dL (ref 0.44–1.00)
GFR calc Af Amer: >60 mL/min (ref >60)
GFR calc non Af Amer: >60 mL/min (ref >60)
Glucose, Bld: 342 mg/dL — ABNORMAL HIGH (ref 65–99)
Potassium: 3.9 mmol/L (ref 3.5–5.1)
Sodium: 132 mmol/L — ABNORMAL LOW (ref 135–145)
Total Bilirubin: 0.5 mg/dL (ref 0.3–1.2)
Total Protein: 7.2 g/dL (ref 6.5–8.1)

## 2016-05-10 LAB — MAGNESIUM: Magnesium: 1.4 mg/dL — ABNORMAL LOW (ref 1.7–2.4)

## 2016-05-10 MED ORDER — PEGFILGRASTIM 6 MG/0.6ML ~~LOC~~ PSKT
6.0000 mg | PREFILLED_SYRINGE | Freq: Once | SUBCUTANEOUS | Status: DC
  Filled 2016-05-10: qty 0.6

## 2016-05-10 MED ORDER — SODIUM CHLORIDE 0.9 % IV SOLN
1300.0000 mg | Freq: Once | INTRAVENOUS | Status: AC
  Administered 2016-05-10: 1300 mg via INTRAVENOUS
  Filled 2016-05-10: qty 23.67

## 2016-05-10 MED ORDER — HEPARIN SOD (PORK) LOCK FLUSH 100 UNIT/ML IV SOLN
500.0000 [IU] | Freq: Once | INTRAVENOUS | Status: AC | PRN
Start: 2016-05-10 — End: 2016-05-10
  Administered 2016-05-10: 500 [IU]
  Filled 2016-05-10: qty 5

## 2016-05-10 MED ORDER — SODIUM CHLORIDE 0.9 % IV SOLN
Freq: Once | INTRAVENOUS | Status: AC
  Administered 2016-05-10: 11:00:00 via INTRAVENOUS
  Filled 2016-05-10: qty 1000

## 2016-05-10 MED ORDER — MAGNESIUM SULFATE 2 GM/50ML IV SOLN
2.0000 g | Freq: Once | INTRAVENOUS | Status: AC
  Administered 2016-05-10: 2 g via INTRAVENOUS
  Filled 2016-05-10: qty 50

## 2016-05-10 MED ORDER — ONDANSETRON 8 MG PO TBDP
8.0000 mg | ORAL_TABLET | Freq: Once | ORAL | Status: AC
  Administered 2016-05-10: 8 mg via ORAL
  Filled 2016-05-10: qty 1

## 2016-05-10 MED ORDER — DEXAMETHASONE SODIUM PHOSPHATE 10 MG/ML IJ SOLN
10.0000 mg | Freq: Once | INTRAMUSCULAR | Status: AC
  Administered 2016-05-10: 10 mg via INTRAVENOUS
  Filled 2016-05-10: qty 1

## 2016-05-10 MED ORDER — SODIUM CHLORIDE 0.9 % IV SOLN: Freq: Once | INTRAVENOUS | Status: DC

## 2016-05-10 NOTE — Progress Notes (Signed)
[facility] day:  05/10/2016   Chief Complaint: [name] is a 71 y.o. female with stage IV ovarian cancer who is seen for assessment prior to day 8 of cycle #3 carboplatin and gemcitabine.  HPI:  The patient was last seen in the medical oncology clinic on 05/03/2016.  At that time, she was feeling good.  She noted a little abdominal tightness.  Exam revealed no shifting dullness.  CA125 had decreased to 76.  She received day 1 of cycle #3 carboplatin and gemcitabine on 05/03/2016.  She received GCSF on 01/03 and 05/05/2016.  She did not receive GCSF on 05/06/2016 as planned.  She complained of severe bone and joint pain.  She started back on prednisone 5 mg a day for her arthritis.  During the interim, she has felt good.  Prednisone helped her joints.  Her blood sugar became elevated.   Past Medical History:  Diagnosis Date  . CAD (coronary artery disease)    a. 09/2012 Cath: LM nl, LAD 95p, 32m LCX 920mOM2 50, RCA 100.  . Marland Kitchenholelithiasis   . Chronic diastolic CHF (congestive heart failure) ([facility]  . Collagen vascular disease ([facility]  . Esophageal stricture   . Exertional shortness of breath   . GERD (gastroesophageal reflux disease)   . H/O hiatal hernia   . Herniated disc   . History of pancytopenia   . Hyperlipidemia   . Hypertension   . Hypokalemia   . Leukopenia 2012   s/p bone marrow biopsy, Dr. PaMa Hillock. NSTEMI (non-ST elevated myocardial infarction) ([facility]5/2014   "mild" (10/18/2012)  . Ovarian cancer ([facility] [place]2016   chemo  . Pneumonia 2013; 08/2012   "one lung; double" (10/18/2012)  . Rheumatoid arthritis(714.0)   . Type I diabetes mellitus ([facility]   "dx'd in 1957" (10/18/2012)    Past Surgical History:  Procedure Laterality Date  . ABDOMINAL HYSTERECTOMY  06/15/2015   Dx L/S, EXLAP TAH BSO omentectomy RSRx colostomy diaphragm resection stripping  . CARDIAC CATHETERIZATION  10/18/2012   "first one was today" (10/18/2012)   . CATARACT EXTRACTION W/ INTRAOCULAR LENS IMPLANT Right 2010  . CHOLECYSTECTOMY  06/15/2015   combined case with ovarian cancer debulking  . COLON SURGERY    . CORONARY ARTERY BYPASS GRAFT N/A 10/19/2012   Procedure: CORONARY ARTERY BYPASS GRAFTING (CABG);  Surgeon: StMelrose NakayamaMD;  [facility] Service: Open Heart Surgery;  Laterality: N/A;  . ESOPHAGEAL DILATION     "3 or 4 times" (10/19/2011)  . ESOPHAGOGASTRODUODENOSCOPY  2012   Dr. IfMinna Merritts. OSTOMY    . OVARY SURGERY     removal  . PERIPHERAL VASCULAR CATHETERIZATION N/A 03/02/2015   Procedure: PoGlori Luisath Insertion;  Surgeon: JaAlgernon HuxleyMD;  [facility];  Service: Cardiovascular;  Laterality: N/A;  . TUBAL LIGATION  1970    Family History  Problem Relation Age of Onset  . Diabetes Mother   . Arthritis Mother   . Diabetes Father   . Arthritis Father   . Bone cancer Sister     Social History:  reports that she quit smoking about 21 years ago. Her smoking use included Cigarettes. She has a 30.00 pack-year smoking history. She has never used smokeless tobacco. She reports that she does not drink alcohol or use drugs.  She is accompanied by her husband today.  Allergies:  Allergies  Allergen Reactions  . Codeine  Nausea And Vomiting  . Latex Rash    Current Medications: Current Outpatient Prescriptions  Medication Sig Dispense Refill  . Calcium-Vitamin D 600-200 MG-UNIT tablet Take 2 tablets by mouth daily.     Marland Kitchen ELIQUIS 5 MG TABS tablet     . folic acid (FOLVITE) 1 MG tablet TAKE 1 TABLET BY MOUTH ONCE A DAY 90 tablet 3  . furosemide (LASIX) 80 MG tablet Take 80 mg by mouth daily as needed.     . Insulin Human (INSULIN PUMP) SOLN Pt uses Humalog insulin.    Marland Kitchen insulin lispro (HUMALOG) 100 UNIT/ML injection Inject 0.06 mLs (6 Units total) into the skin daily. 30 mL 3  . lidocaine-prilocaine (EMLA) cream Apply 1 application topically as needed (prior to accessing port).    . loratadine  (CLARITIN) 10 MG tablet Take 1 tablet by mouth daily as needed.    Marland Kitchen losartan (COZAAR) 100 MG tablet Take 1 tablet (100 mg total) by mouth daily. 90 tablet 1  . magnesium oxide (MAG-OX) 400 (241.3 Mg) MG tablet Take 1 tablet (400 mg total) by mouth daily. 30 tablet 2  . metoprolol tartrate (LOPRESSOR) 25 MG tablet Take 1 tablet (25 mg total) by mouth 2 (two) times daily. 180 tablet 3  . ondansetron (ZOFRAN) 8 MG tablet TAKE 1 TABLET BY MOUTH EVERY 8 HOURS AS NEEDED FOR NAUSEA OR VOMITING 30 tablet 1  . ONE TOUCH ULTRA TEST test strip     . oxyCODONE (OXY IR/ROXICODONE) 5 MG immediate release tablet Take 5 mg by mouth every 6 (six) hours as needed. Reported on 11/17/2015    . pantoprazole (PROTONIX) 40 MG tablet TAKE 1 TABLET BY MOUTH TWICE A DAY 180 tablet 1  . potassium chloride SA (K-DUR,KLOR-CON) 20 MEQ tablet TAKE 2 TABLETS BY MOUTH ONCE A DAY 60 tablet 1  . ranitidine (ZANTAC) 150 MG tablet Take 1 tablet (150 mg total) by mouth 2 (two) times daily. 180 tablet 1  . vitamin C (ASCORBIC ACID) 500 MG tablet Take 500 mg by mouth daily.    Marland Kitchen zinc gluconate 50 MG tablet Take 50 mg by mouth daily.     No current facility-administered medications for this visit.    Facility-Administered Medications Ordered in Other Visits  Medication Dose Route Frequency Provider Last Rate Last Dose  . sodium chloride 0.9 % injection 10 mL  10 mL Intracatheter PRN Lequita Asal, MD      . sodium chloride 0.9 % injection 10 mL  10 mL Intravenous PRN Lequita Asal, MD   10 mL at 04/13/15 3559    Review of Systems:  GENERAL:  Feels good.  No fever, chills or sweats.  Weight down 5 pounds. PERFORMANCE STATUS (ECOG):  1 HEENT:  No visual changes, sore throat, mouth sores or tenderness. Lungs:  No shortness of breath or cough.  No hemoptysis. Cardiac:  No chest pain, palpitations, orthopnea, or PND. GI:  No abdominal pain or distention.  No nausea, vomiting, diarrhea, melena or hematochezia. GU:  No  urgency, frequency, dysuria, or hematuria. Musculoskeletal:  Severe rheumatoid arthritis (off MTX and on prednisone).  Osteoporosis.  No back pain. No muscle tenderness. Extremities:  No pain or swelling. Skin:  No increased bruising or bleeding on Eliquis.  No rashes or skin changes. Neuro:  Neuropathy (stable).  No headache, numbness or weakness, balance or coordination issues. Endocrine:  Diabetes on an insulin pump.  Blood sugar increased on prednisone.  No thyroid issues, hot flashes or  night sweats. Psych:  No mood changes, depression or anxiety. Pain: No pain. Review of systems:  All other systems reviewed and found to be negative.  Physical Exam: Blood pressure 104/62, pulse 86, temperature (!) 96 F (35.6 C), temperature source Tympanic, resp. rate 18, weight 137 lb 12.6 oz (62.5 kg). GENERAL:  Well developed, well nourished woman sitting comfortably in the exam room in no acute distress. MENTAL STATUS:  Alert and oriented to person, place and time. HEAD:  Curly gray hair.  Normocephalic, atraumatic, face symmetric, no Cushingoid features. EYES:  Gold rimmed glasses.  Blue eyes.  No conjunctivitis or scleral icterus.  ENT: Oropharynx clear without lesion. Tongue normal. Mucous membranes moist.  RESPIRATORY: Clear to auscultation without rales, wheezes or rhonchi. CARDIOVASCULAR: Regular rate and rhythm without murmur, rub or gallop. ABDOMEN: Soft, non-tender with active bowel sounds and no appreciable hepatosplenomegaly. No palpable nodularity or masses.  No shifting dullness.  Insulin pump. SKIN: No rashes, ulcers, or bruises. EXTREMITIES:  No edema, skin discoloration or tenderness. No palpable cords. LYMPH NODES: No palpable cervical, supraclavicular, axillary or inguinal adenopathy  NEUROLOGICAL: Appropriate. PSYCH: Appropriate.   Appointment on 05/10/2016  Component Date Value Ref Range Status  . WBC 05/10/2016 2.8* 3.6 - 11.0 K/uL Final  . RBC 05/10/2016 2.95*  3.80 - 5.20 MIL/uL Final  . Hemoglobin 05/10/2016 9.0* 12.0 - 16.0 g/dL Final  . HCT 05/10/2016 26.2* 35.0 - 47.0 % Final  . MCV 05/10/2016 88.7  80.0 - 100.0 fL Final  . MCH 05/10/2016 30.5  26.0 - 34.0 pg Final  . MCHC 05/10/2016 34.4  32.0 - 36.0 g/dL Final  . RDW 05/10/2016 17.6* 11.5 - 14.5 % Final  . Platelets 05/10/2016 153  150 - 440 K/uL Final  . Neutrophils Relative % 05/10/2016 56  % Final  . Neutro Abs 05/10/2016 1.6  1.4 - 6.5 K/uL Final  . Lymphocytes Relative 05/10/2016 24  % Final  . Lymphs Abs 05/10/2016 0.7* 1.0 - 3.6 K/uL Final  . Monocytes Relative 05/10/2016 18  % Final  . Monocytes Absolute 05/10/2016 0.5  0.2 - 0.9 K/uL Final  . Eosinophils Relative 05/10/2016 1  % Final  . Eosinophils Absolute 05/10/2016 0.0  0 - 0.7 K/uL Final  . Basophils Relative 05/10/2016 1  % Final  . Basophils Absolute 05/10/2016 0.0  0 - 0.1 K/uL Final  . Sodium 05/10/2016 132* 135 - 145 mmol/L Final  . Potassium 05/10/2016 3.9  3.5 - 5.1 mmol/L Final  . Chloride 05/10/2016 96* 101 - 111 mmol/L Final  . CO2 05/10/2016 28  22 - 32 mmol/L Final  . Glucose, Bld 05/10/2016 342* 65 - 99 mg/dL Final  . BUN 05/10/2016 27* 6 - 20 mg/dL Final  . Creatinine, Ser 05/10/2016 0.83  0.44 - 1.00 mg/dL Final  . Calcium 05/10/2016 8.7* 8.9 - 10.3 mg/dL Final  . Total Protein 05/10/2016 7.2  6.5 - 8.1 g/dL Final  . Albumin 05/10/2016 3.2* 3.5 - 5.0 g/dL Final  . AST 05/10/2016 22  15 - 41 U/L Final  . ALT 05/10/2016 14  14 - 54 U/L Final  . Alkaline Phosphatase 05/10/2016 65  38 - 126 U/L Final  . Total Bilirubin 05/10/2016 0.5  0.3 - 1.2 mg/dL Final  . GFR calc non Af Amer 05/10/2016 >60  >60 mL/min Final  . GFR calc Af Amer 05/10/2016 >60  >60 mL/min Final   Comment: (NOTE) The eGFR has been calculated using the CKD EPI equation. This calculation  has not been validated in all clinical situations. eGFR's persistently <60 mL/min signify possible Chronic Kidney Disease.   . Anion gap 05/10/2016 8   5 - 15 Final  . Magnesium 05/10/2016 1.4* 1.7 - 2.4 mg/dL Final    Assessment:  [name] is a 71 y.o. female with stage IV ovarian cancer.  She presented with abdominal discomfort and bloating.  Omental biopsy on 02/23/2015 revealed metastatic high grade serous carcinoma, consistent with gynecologic origin.   She was initially diagnosed with clinical stage IIIC (T3cN1Mx).  CA125 was 707 on 02/17/2015.  Abdomen and pelvic CT scan on 02/13/2015 revealed bilateral mass-like adnexal regions (right adnexal mass 5.6 x 5.0 cm and the left adnexa mass 10.3 x 6.0 x 9.9 cm).  There was a large amount of soft tissue throughout the peritoneal cavity involving the omentum and other peritoneal surfaces.  There was a small volume ascites. There was a peripheral 3.3 x 1.9 cm low-attenuation lesion overlying the right lobe of the liver , likely representing a serosal implant. There was 1.4 x 2.2 cm ill-defined peripheral lesion within the inferior aspect of segment 6 adjacent to the ampulla in the duodenum.   She received 4 cycles of neoadjuvant carboplatin and Taxol (03/05/2015 - 05/22/2015).  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  She had loose stools on oral magnesium.  She was initially on Neurontin then switched Lyrica with cycle #3.  Cycle #4 was notable for neutropenia (ANC 300) requiring GCSF x 3 days.    CA125 was 802.9 on 03/30/2015, 567.9 on 04/13/2015, 168.8 on 05/15/2015, 85.2 on 07/17/2015, 68.6 on 07/28/2015, 34.5 on 09/17/2015, 20.7 on 11/06/2015, 49 on 01/22/2016, 106.1 on 02/22/2016, 138.8 on 03/07/2016, 220.8 on 03/21/2016, 152.8 on 04/11/2016, 125.6 on 04/18/2016, and 76 on 05/03/2016.  Abdominal and pelvic CT scan on 04/28/2015 revealed decreasing bilateral ovarian masses.   The left adnexal mass measured 4.0 x 6.1 cm (previously 5.0 x 8.5 cm). The right ovary measured 3.8 x 4.9 cm (previously 4.7 x 5.6 cm).  There was improved peritoneal carcinomatosis.  There was a small amount of  ascites.  The hepatic dome lesion was stable. The previously seen right hepatic lobe lesion was not well-visualized.  There was a small left pleural effusion and trace right pleural effusion.  The nodular lesion at the ampulla of Vater, extending into the duodenum was stable.  There was no biliary ductal dilatation.  She underwent exploratory laparotomy, lysis of adhesions, total abdominal hysterectomy with bilateral salpingo-oophorectomy, infracolic omentectomy, optimal tumor debulking(< 1 cm), recto-sigmoid resection with creation of end colostomy, cholecystectomy, mobilization of splenic flexure and liver with diaphragmatic stripping on 06/15/2015. The right diaphragm was cleared of tumor. During dissection, the diaphragm was entered and closed with sutures.   She has had a recurrent right sided pleural effusion.  She underwent thoracentesis of 650 cc on post-operative day 3.  She was admitted to [facility] on 07/01/2015 and 07/07/2015 for recurrent shortness of breath.  She has undergone 2 additional thoracentesis (1.1 L on 07/02/2015 and 850 cc on 07/08/2015).  Cytology was negative x 2.  Bilateral lower extremity duplex on 07/03/2015 was negative.  Echo on 07/08/2015 revealed en EF of 55-60%.    She has severe rheumatoid arthritis.  Methotrexate and Enbrel were initially on hold.  She has a normocytic anemia.  Work-up on 02/17/2015 revealed a normal ferritin, B12, folate, TSH.  She denies any melena or hematochezia.    She has anemia likely due to chronic  disease. She received 1 unit PRBCs during her admission at [facility]. She denies any melena or hematochezia. She has diabetes and is on an insulin pump.  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 She was on Lovenox 60 mg twice a day.  She switched to Eliquis on 04/16/2016.  She was admitted to [facility] from  07/29/2015 - 08/10/2015 with a right-sided empyema and liver abscess. She underwent CT-guided placement of a liver abscess drain on 07/30/2015. Liver abscess culture grew out group B strep and Enterobacter which was sensitive to Zosyn. She was transitioned to ertapenem Colbert Ewing) prior to discharge.  She was readmitted to [facility] on 09/17/2015.  Chest, abdomen, and pelvic CT scan on 09/01/2015 revealed continued decrease in perihepatic fluid collection contiguous with the right pleural space with percutaneous drain. Drain was removed on 09/01/2015.  Chest, abdomen, and pelvic CT scan on 09/11/2015 revealed a residual versus recurrent fluid collection posterior to the right hepatic lobe (2.6 x 1.1 x 4.0 cm).  Chest, abdomen, and pelvic CT scan on 10/13/2015 revealed resolution of the empyema.    She has had persistent neutropenia felt secondary to her rheumatoid arthritis.  Folate and MMA were normal.  TSH was 6.13 (high) with a free T4 of 1.17 (0.61-1.12).  She began methotrexate (10 mg a week) and prednisone (5 mg a day) for severe rheumatoid arthritis on 12/17/2015.  Bone marrow aspirate and biopsy on 06/09/2010 revealed a hypercellular marrow (70%) with no evidence of dysplasia or malignancy.  Flow cytometry was negative.  Cytogenetics were normal (46,XX).  FISH studies were negative for MDS.  Bone marrow aspirate and biopsy on 12/03/2015 revealed a normocellular to mildy hypercellular marrow for age (40%) with left shifted myelopoiesis, non specific dyserythropoiesis and mild megakaryocytic atypia with no increase in blasts.  There were multiple small nonspecific lymphoid aggregates (favor reactive). There was no increase in reticulin.  There was decreased myeloid cells (37%) with left shifted maturation and 1% atypical myelod blasts.  There was relatively increased monocytic cells (11%), relatively increased lymphoid cells (36%), and relatively increased eosinophils (6%).  Cytogenetics were normal (29,  XX).  SNP microarray was normal.  Alkaline phosphatase was 348 (38-126) on 11/06/2015 and 431 on 11/17/2015.   Fractionated alkaline phosphatase on 11/09/2015 revealed 21% bone and 79% liver.  MRI of the abdomen on 12/14/2015 revealed mild extrahepatic biliary duct dilatation without obstructing lesion identified.  Chest, abdomen, and pelvic CT scan on 02/15/2016 revealed multiple soft tissue nodules throughout the pelvis, small bowel mesenteric and peritoneum and, concerning for peritoneal metastatic disease.  There was soft tissue irregularity within the left upper quadrant.  There was mild right hydronephrosis (etiology unclear).  There was interval resolution of previously described right pleural based fluid and gas collection. There was a pleural based nodule within the right lower hemi-thorax concerning for pleural based metastasis.  There were small bilateral pleural effusions.  There was pulmonary nodularity, predominately within the left upper lobe (metastatic or infectious/inflammatory etiology).  There was slightly increased mediastinal adenopathy (infectious/inflammatory or metastatic).  There was a low attenuation lesion within the left hepatic lobe (complicated fluid within the fissure or metastatic disease).  She was on tamoxifen from 02/25/2016 - 03/14/2016.  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  Abdomen and pelvic CT scan on 03/08/2016 revealed mild progression of peritoneal disease in the abdomen.  There was interval increase in loculated fluid around the lateral segment left liver, stomach, and spleen.  There was persistent soft tissue lesion at the level of the ampulla with mild intra and extrahepatic biliary duct dilatation.  There was persistent intrahepatic and capsular metastatic disease involving the liver.  She is currently day 8 of cycle #3 carboplatin and gemcitabine (03/21/2016 - 05/03/2016) with Neulasta support.  Cycle #1 and #2 were truncated  secondary to low counts at initiation of treatment and day 8, respectively.  Nadir platelet count was 107,000 on day 11 of cycle #1 and 105,000 on day 11 cycle #2.   She received GCSF on day 2 and 3 of cycle #3  Symptomatically, she feels good.  Counts are adequate (WBC 2800 with ANC 1600).  Platelet count is normal.  She has mild hypomagnesemia (1.4) despite oral magnesium.  Plan: 1.  Labs today:  CBC with diff, CMP, Mg, copper level. 2.  Day 8 gemcitabine today. 3.  Magnesium 2 gm IV today 4.  Continue Eliquis.  Follow platelet count. 5.  RTC for Neupogen 300 mcg SQ x 4 days (01/10, 01/11, 01/12, 01/15) with labs (CBC with diff on 01/12 and 01/15). 6.  RTC on 05/24/2016 for MD assessment, labs (CBC with diff, CMP, Mg), and day 1 of cycle # 4 carboplatin and gemcitabine.   Lequita Asal, MD  05/10/2016, 9:42 AM

## 2016-05-10 NOTE — Progress Notes (Signed)
Patient has been receiving gemzar 1000mg /m2, however dose today is 800mg /m2. Called and spoke with MD. MD would like to reduce dose by 80% today due to myelosuppression.

## 2016-05-10 NOTE — Progress Notes (Signed)
Patient offers no complaints today. 

## 2016-05-11 ENCOUNTER — Inpatient Hospital Stay: Payer: Medicare Other

## 2016-05-11 ENCOUNTER — Encounter: Payer: Self-pay | Admitting: Obstetrics and Gynecology

## 2016-05-11 ENCOUNTER — Inpatient Hospital Stay (HOSPITAL_BASED_OUTPATIENT_CLINIC_OR_DEPARTMENT_OTHER): Payer: Medicare Other | Admitting: Obstetrics and Gynecology

## 2016-05-11 VITALS — BP 118/56 | HR 80 | Temp 98.2°F | Ht 63.0 in | Wt 140.3 lb

## 2016-05-11 DIAGNOSIS — C801 Malignant (primary) neoplasm, unspecified: Secondary | ICD-10-CM

## 2016-05-11 DIAGNOSIS — E108 Type 1 diabetes mellitus with unspecified complications: Secondary | ICD-10-CM

## 2016-05-11 DIAGNOSIS — Z933 Colostomy status: Secondary | ICD-10-CM | POA: Diagnosis not present

## 2016-05-11 DIAGNOSIS — K219 Gastro-esophageal reflux disease without esophagitis: Secondary | ICD-10-CM

## 2016-05-11 DIAGNOSIS — Z90722 Acquired absence of ovaries, bilateral: Secondary | ICD-10-CM

## 2016-05-11 DIAGNOSIS — Z7901 Long term (current) use of anticoagulants: Secondary | ICD-10-CM

## 2016-05-11 DIAGNOSIS — C787 Secondary malignant neoplasm of liver and intrahepatic bile duct: Secondary | ICD-10-CM

## 2016-05-11 DIAGNOSIS — E785 Hyperlipidemia, unspecified: Secondary | ICD-10-CM

## 2016-05-11 DIAGNOSIS — C786 Secondary malignant neoplasm of retroperitoneum and peritoneum: Secondary | ICD-10-CM

## 2016-05-11 DIAGNOSIS — C561 Malignant neoplasm of right ovary: Secondary | ICD-10-CM | POA: Diagnosis not present

## 2016-05-11 DIAGNOSIS — Z87891 Personal history of nicotine dependence: Secondary | ICD-10-CM

## 2016-05-11 DIAGNOSIS — I5032 Chronic diastolic (congestive) heart failure: Secondary | ICD-10-CM

## 2016-05-11 DIAGNOSIS — Z794 Long term (current) use of insulin: Secondary | ICD-10-CM

## 2016-05-11 DIAGNOSIS — M069 Rheumatoid arthritis, unspecified: Secondary | ICD-10-CM

## 2016-05-11 DIAGNOSIS — Z79899 Other long term (current) drug therapy: Secondary | ICD-10-CM

## 2016-05-11 DIAGNOSIS — I10 Essential (primary) hypertension: Secondary | ICD-10-CM

## 2016-05-11 DIAGNOSIS — E10621 Type 1 diabetes mellitus with foot ulcer: Secondary | ICD-10-CM | POA: Diagnosis not present

## 2016-05-11 DIAGNOSIS — Z90710 Acquired absence of both cervix and uterus: Secondary | ICD-10-CM

## 2016-05-11 MED ORDER — TBO-FILGRASTIM 300 MCG/0.5ML ~~LOC~~ SOSY
300.0000 ug | PREFILLED_SYRINGE | Freq: Once | SUBCUTANEOUS | Status: AC
  Administered 2016-05-11: 300 ug via SUBCUTANEOUS
  Filled 2016-05-11: qty 0.5

## 2016-05-11 MED ORDER — FILGRASTIM 300 MCG/0.5ML IJ SOSY: 300.0000 ug | PREFILLED_SYRINGE | Freq: Once | INTRAMUSCULAR | Status: DC

## 2016-05-11 NOTE — Progress Notes (Signed)
Gynecologic Oncology Consult Visit   Referring Provider: Lequita Asal, MD  Additional Care Provider: Dr. Harvest Dark.  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    Subjective:  [name] is a 71 y.o. 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  She was last seen in our clinic 01/27/2016 and we recommended proceeding with chemotherapy if she had a symptomatic relapse or if her CT scan shows considerable disease. She followed up with Dr. Mike Gip 02/15/2016.    CT scan revealed multiple soft tissue nodules throughout the pelvis, small bowel mesenteric and peritoneum and, concerning for peritoneal metastatic disease.  There was soft tissue irregularity within the left upper quadrant which may represent postprocedural changes or metastatic disease.  There was mild right hydronephrosis (etiology unclear).  There was interval resolution of previously described right pleural based fluid and gas collection. There was a pleural based nodule within the right lower hemi-thorax concerning for the possibility of pleural based metastasis.  There were small bilateral pleural effusions.  There was pulmonary nodularity, predominately within the left upper lobe (metastatic or infectious/inflammatory etiology).  There was slightly increased mediastinal adenopathy (infectious/inflammatory or metastatic).  There was the interval development of a low attenuation lesion within the left hepatic lobe (complicated fluid within the fissure or metastatic disease).  She was restarted on chemotherapy with carboplatin and gemcitabine.  She just saw Dr. Mike Gip prior to day 8 of cycle #3 carboplatin and gemcitabine.  She had a complete exam from Dr. Mike Gip and presents today for a pelvic exam.     Lab Results  Component Value Date   CA125 76.8 (H) 05/03/2016   CA125 125.6 (H) 04/18/2016   CA125 152.8 (H) 04/11/2016     HPI: The patient presented to the emergency room at [facility] on 02/13/2015 with a 2-3 week history of diffuse abdominal discomfort and bloating.    Abdomen and pelvic CT scan on 02/13/2015 revealed bilateral mass-like adnexal regions. The right adnexal region measured approximately 5.6 x 5.0 cm posterior to the uterus, and the left adnexa demonstrated a large mass measuring approximately 10.3 x 6.0 x 9.9 cm, with heterogeneous internal areas of enhancement and cystic change. This appeared intimately associated with the adjacent proximal sigmoid colon. There was a large amount of soft tissue throughout the peritoneal cavity involving the omentum and other peritoneal surfaces, compatible with widespread intraperitoneal metastatic disease. There was a small volume of presumably malignant ascites. There was a peripheral low-attenuation lesion overlying the right lobe of the liver measuring approximately 3.3 x 1.9 cm, favored to represent a serosal implant. There was a similar appearing 1.4 x 2.2 cm ill-defined peripheral lesion within the inferior aspect of segment 6 adjacent to the ampulla in the duodenum. There was no lytic or blastic lesions. She had a chronic T12 compression fracture.  She was seen in consultation from Dr. Mike Gip for bilateral ovarian masses with associated peritoneal metastatic disease.   02/23/2015 CT biopsy high grade serous carcinoma.  Decision was made for neoadjuvant chemotherapy with carboplatin/taxol. She has had 4 cycles of carboplatin (AUC 5) /taxol (175 mg per sq M) with Dr Mike Gip, last 12/19.  CA125 dropped from 707 in 10/16 to 168.8 in 05/15/2015.   She stopped MTX and Enbarel she had been taking for rheumatoid arthritis while on chemotherapy and joint symptoms have been  in good control.  Uses Prednisone 67m qd prn and has taken this the  last three days.   CT scan 04/27/16 Heterogeneous left adnexal mass measures approximately 4.0 x 6.1 cm, previously approximately 5.0 x 8.5 cm when remeasured. Heterogeneous right ovary measures approximately 3.8 x 4.9 cm, roughly 4.7 x 5.6 cm on the prior exam. Uterus is visualized. Other: Omental caking has improved in the interval but has not fully resolved. Residual scattered areas of omental nodularity and thickening persist. Small ascites, decreased.   Hepatobiliary: Low-attenuation lesion in the periphery of the hepatic dome measures 3.1 cm, stable. A lesion previously seen off the posterior right hepatic lobe is not readily visualized. There is a small amount of fluid in Morison's pouch. Numerous stones are seen in the gallbladder. No biliary ductal dilatation.  IMPRESSION: 1. Interval response to therapy as evidenced by decrease in size of bilateral ovarian masses and improved, but not resolved, peritoneal carcinomatosis. 2. Hepatic dome lesion is unchanged. Previously seen right hepatic lobe lesion is not well-visualized. 3. Small ascites and small left pleural effusion. Trace right pleural effusion. 4. Cholelithiasis. 5. Nodular lesion at the ampulla of Vater, extending into the duodenum, stable.  On 06/15/2015 she underwent diagnostic laparoscopy, exploratory laparotomy, lysis of adhesions (including enterolysis for > 45 minutes), total abdominal hysterectomy with bilateral salpingo-oophorectomy, infracolic omentectomy, optimal tumor debulking, recto-sigmoid resection with creation of end colostomy, mobilization of the splenic flexure  mobilization of the liver with diaphragmatic stripping and resection of diaphragm due to disease, repair of diaphragmatic defect and cholecystectomy. Her initial postoperative course was unremarkable.   PATHOLOGY: DIAGNOSIS  A. Uterus, bilateral ovaries and fallopian tubes, hysterectomy and bilateral salpingo-oophorectomy:  Left ovary: Residual high  grade serous carcinoma, 2.5 cm. Right ovary: Residual high grade serous carcinoma, 1.5 cm.  Right and left fallopian tubes, free of tumor. See comment.  Uterus:  Residual high grade serous carcinoma involving uterine serosa and myometrium. Endometrium: Atrophic endometrium Cervix: No pathologic diagnosis.   See synoptic report for additional details.  Comment: Immunostains performed on block A17 ( right tube) show a small segment of p53 positive tubal epithelium. However, this focus of epithelium is cytological unremarkable and shows no increase in Ki-67 labelling or P16 staining. It is therefore classified as a p53 signature lesion rather than a serous tubal intraepithelial carcinoma.    B. Rectomsigmoid colon and partial left ovary, resection:   Residual high grade serous carcinoma involving colonic serosa and subserosa, fallopian tube and ovary.   C. Omentum, omentectomy:   Residual high grade serous carcinoma, tumor size > 2 cm.   D. Perihepatic tumor, excision:  Residual high grade serous carcinoma.   E. Gallbladder, cholecystectomy:  Chronic cholecystitis and cholelithiasis. Negative for malignancy.   F. Diaphragm, excision:  Residual high grade serous carcinoma, > 2 cm  G. Omentum #2, excision:   Residual high grade serous carcinoma.     [facility] for recurrent shortness of breath.  She has undergone 2 additional thoracentesis (1.1 L on 07/02/2015 and 850 cc on 07/08/2015).  Cytology was negative x 2.  Bilateral lower extremity duplex on 07/03/2015 was negative.  Echo on 07/08/2015 revealed en EF of 55-60%.    [facility] with a right-sided empyema and perihepatic abscess. She underwent CT-guided placement of a liver abscess drain on 07/30/2015. Liver abscess culture grew out group B strep  and  Enterobacter which was sensitive to Zosyn. She was transitioned to ertapenem Colbert Ewing) prior to discharge.  She was readmitted to [facility] on 09/17/2015.  Chest, abdomen, and pelvic CT scan on 09/01/2015 revealed continued decrease in perihepatic fluid collection contiguous with the right pleural space with percutaneous drain. Drain was removed on 09/01/2015.  Chest, abdomen, and pelvic CT scan on 09/11/2015 revealed a residual versus recurrent fluid collection posterior to the right hepatic lobe (2.6 x 1.1 x 4.0 cm).  Chest, abdomen, and pelvic CT scan on 10/13/2015 revealed resolution of the empyema.    Chemotherapy was held due to a combination of multiple comorbidities and RA associated neutropenia.   Genetic testing: MyRisk testing negative for APC, ATM, BMPR1A, BRCA1, BRCA2, BRIP1, CDH1, CDK4, CDKN2A, CHEK2, EPCAM, MLH1, MSH2, MSH6, MUTYH, NBN, PALB2, PMS2, PTEN, RAD51C, RAD51D, SMAD4, STK11, TP53. Sequencing for select regions of POLE and POLD1, and large rearrangement analysis for select regions of GREM1 were negative.   Of note she has severe rheumatoid arthritis with persistent neutropenia; diabetes and is on an insulin pump; near occlusive thrombus within the central portion of the right internal jugular vein and central portion of the right subclavian vein on Lovenox; and persistent grade III neuropathy.     Problem List: Patient Active Problem List   Diagnosis Date Noted  . Hx of CABG 04/12/2016  . Encounter for antineoplastic chemotherapy 03/31/2016  . Heel ulcer ([place]) 12/09/2015  . Bile duct abnormality 12/09/2015  . Diabetic ulcer of left heel associated with type 1 diabetes mellitus ([place]) 12/07/2015  . Alkaline phosphatase elevation 11/17/2015  . Leukopenia 11/06/2015  . Weight loss 09/17/2015  . Liver abscess 09/03/2015  . Thrombosis of right internal jugular vein ([place]) 09/03/2015  . Fatigue 07/17/2015  . Recurrent pleural effusion on right 07/01/2015  . Decubitus ulcer  05/19/2015  . Hyponatremia 05/15/2015  . Neuropathy ([place]) 05/15/2015  . Malignant neoplasm of right ovary ([place]) 05/06/2015  . Bilateral lower extremity edema 03/30/2015  . Hypomagnesemia 03/05/2015  . Diarrhea 02/26/2015  . Peritoneal carcinomatosis ([place]) 02/17/2015  . Anemia 02/17/2015  . Adnexal mass 02/13/2015  . Angina pectoris associated with type 2 diabetes mellitus ([place]) 02/10/2015  . History of esophageal stricture 07/08/2014  . Dysphagia, pharyngoesophageal phase 07/08/2014  . Candida esophagitis ([place]) 05/15/2014  . Cataracts, bilateral 01/13/2014  . Medicare annual wellness visit, subsequent 09/20/2013  . CAD (coronary artery disease) of artery bypass graft 11/05/2012  . Anemia, iron deficiency 10/09/2012  . CAD (coronary artery disease) 10/01/2012  . Hearing loss 07/06/2011  . Diabetes type 1, controlled ([place]) 07/06/2011  . Gastroesophageal reflux disease with stricture 07/06/2011  . GERD (gastroesophageal reflux disease) 04/05/2011  . Osteoporosis, unspecified 04/05/2011  . Hypertension 01/10/2011  . Hyperlipidemia 01/10/2011  . RA (rheumatoid arthritis) ([place]) 01/10/2011   Past Medical History: Past Medical History:  Diagnosis Date  . CAD (coronary artery disease)    a. 09/2012 Cath: LM nl, LAD 95p, 28m LCX 9107mOM2 50, RCA 100.  . Marland Kitchenholelithiasis   . Chronic diastolic CHF (congestive heart failure) ([place]  . Collagen vascular disease ([place]  . Esophageal stricture   . Exertional shortness of breath   . GERD (gastroesophageal reflux disease)   . H/O hiatal hernia   . Herniated disc   . History of pancytopenia   . Hyperlipidemia   . Hypertension   . Hypokalemia   . Leukopenia 2012   s/p bone marrow biopsy, Dr. PaMa Hillock. NSTEMI (non-ST elevated myocardial  infarction) ([place]) 08/2012   "mild" (10/18/2012)  . Ovarian cancer ([place]) 2016   chemo  . Pneumonia 2013; 08/2012   "one lung; double" (10/18/2012)  . Rheumatoid arthritis(714.0)   . Type I diabetes mellitus  ([place])    "dx'd in 1957" (10/18/2012)    Past Surgical History: Past Surgical History:  Procedure Laterality Date  . ABDOMINAL HYSTERECTOMY  06/15/2015   Dx L/S, EXLAP TAH BSO omentectomy RSRx colostomy diaphragm resection stripping  . CARDIAC CATHETERIZATION  10/18/2012   "first one was today" (10/18/2012)  . CATARACT EXTRACTION W/ INTRAOCULAR LENS IMPLANT Right 2010  . CHOLECYSTECTOMY  06/15/2015   combined case with ovarian cancer debulking  . COLON SURGERY    . CORONARY ARTERY BYPASS GRAFT N/A 10/19/2012   Procedure: CORONARY ARTERY BYPASS GRAFTING (CABG);  Surgeon: Melrose Nakayama, MD;  Location: [facility];  Service: Open Heart Surgery;  Laterality: N/A;  . ESOPHAGEAL DILATION     "3 or 4 times" (10/19/2011)  . ESOPHAGOGASTRODUODENOSCOPY  2012   Dr. Minna Merritts  . OSTOMY    . OVARY SURGERY     removal  . PERIPHERAL VASCULAR CATHETERIZATION N/A 03/02/2015   Procedure: Glori Luis Cath Insertion;  Surgeon: Algernon Huxley, MD;  Location: [place] [facility];  Service: Cardiovascular;  Laterality: N/A;  . TUBAL LIGATION  1970    Past Gynecologic History:  Postmenopausal She has not had a GYN evaluation since age 57.    OB History:  G3P3  1. SVD - neonatal death 2. SVD - singleton 3. SVD - twins  Family History: Family History  Problem Relation Age of Onset  . Diabetes Mother   . Arthritis Mother   . Diabetes Father   . Arthritis Father   . Bone cancer Sister     Social History: Social History   Social History  . Marital status: Married    Spouse name: N/A  . Number of children: 3  . Years of education: N/A   Occupational History  . Retired Retired   Social History Main Topics  . Smoking status: Former Smoker    Packs/day: 1.00    Years: 30.00    Types: Cigarettes    Quit date: 06/11/1994  . Smokeless tobacco: Never Used  . Alcohol use No  . Drug use: No  . Sexual activity: No   Other Topics Concern  . Not on file   Social History Narrative  . No narrative on  file    Allergies: Allergies  Allergen Reactions  . Codeine Nausea And Vomiting  . Latex Rash    Current Medications: Current Outpatient Prescriptions  Medication Sig Dispense Refill  . Calcium-Vitamin D 600-200 MG-UNIT tablet Take 2 tablets by mouth daily.     Marland Kitchen ELIQUIS 5 MG TABS tablet     . folic acid (FOLVITE) 1 MG tablet TAKE 1 TABLET BY MOUTH ONCE A DAY 90 tablet 3  . furosemide (LASIX) 80 MG tablet Take 80 mg by mouth daily as needed.     . Insulin Human (INSULIN PUMP) SOLN Pt uses Humalog insulin.    Marland Kitchen insulin lispro (HUMALOG) 100 UNIT/ML injection Inject 0.06 mLs (6 Units total) into the skin daily. 30 mL 3  . lidocaine-prilocaine (EMLA) cream Apply 1 application topically as needed (prior to accessing port).    . loratadine (CLARITIN) 10 MG tablet Take 1 tablet by mouth daily as needed.    Marland Kitchen losartan (COZAAR) 100 MG tablet Take 1 tablet (100 mg total) by mouth daily.  90 tablet 1  . magnesium oxide (MAG-OX) 400 (241.3 Mg) MG tablet Take 1 tablet (400 mg total) by mouth daily. 30 tablet 2  . metoprolol tartrate (LOPRESSOR) 25 MG tablet Take 1 tablet (25 mg total) by mouth 2 (two) times daily. 180 tablet 3  . ondansetron (ZOFRAN) 8 MG tablet TAKE 1 TABLET BY MOUTH EVERY 8 HOURS AS NEEDED FOR NAUSEA OR VOMITING 30 tablet 1  . ONE TOUCH ULTRA TEST test strip     . oxyCODONE (OXY IR/ROXICODONE) 5 MG immediate release tablet Take 5 mg by mouth every 6 (six) hours as needed. Reported on 11/17/2015    . pantoprazole (PROTONIX) 40 MG tablet TAKE 1 TABLET BY MOUTH TWICE A DAY 180 tablet 1  . potassium chloride SA (K-DUR,KLOR-CON) 20 MEQ tablet TAKE 2 TABLETS BY MOUTH ONCE A DAY 60 tablet 1  . ranitidine (ZANTAC) 150 MG tablet Take 1 tablet (150 mg total) by mouth 2 (two) times daily. 180 tablet 1  . vitamin C (ASCORBIC ACID) 500 MG tablet Take 500 mg by mouth daily.    Marland Kitchen zinc gluconate 50 MG tablet Take 50 mg by mouth daily.     No current facility-administered medications for this  visit.    Facility-Administered Medications Ordered in Other Visits  Medication Dose Route Frequency Provider Last Rate Last Dose  . sodium chloride 0.9 % injection 10 mL  10 mL Intracatheter PRN Lequita Asal, MD      . sodium chloride 0.9 % injection 10 mL  10 mL Intravenous PRN Lequita Asal, MD   10 mL at 04/13/15 3845    Review of Systems General: fatigue and weakness Skin: negative HEENT: negative for, change in hearing, pain, discharge, tinnitus, vertigo, voice changes, sore throat, neck masses Pulmonary: dyspnea, productive cough Cardiac: negative for pain Gastrointestinal:  N/v due to chemotherapy and ostomy is functioning well. She complaints of constipation/diarrhea Genitourinary/Sexual: negative for, dysuria Ob/Gyn: negative for, irregular bleeding, pain Musculoskeletal: negative  Hematology: negative for, easy bruising, bleeding Neuro: peripheral neuropathy symptoms, chronic secondary from chemotherapy and DM  Objective:  Physical Examination:  BP (!) 118/56 (BP Location: Left Arm, Patient Position: Sitting)   Pulse 80   Temp 98.2 F (36.8 C) (Tympanic)   Ht 5' 3"  (1.6 m)   Wt 140 lb 4.5 oz (63.6 kg)   BMI 24.85 kg/m   Body mass index is 24.85 kg/m.  ECOG Performance Status: 2 - Symptomatic, <50% confined to bed  General appearance: alert, cooperative and appears stated age, pale Abdomen: soft, nondistended and nontendder, no mass/ascites/hernias. Ostomy bag intact, stoma partially seen and appears pink.  Incision well healed. Extremities: extremities atraumatic, resolved edema Neurological exam reveals alert, oriented, normal speech, no focal findings or movement disorder noted.  Pelvic: EGBUS/Vagina: normal. Vaginal cuff intact. No gross lesions. Uterus/cervic surgically absent.  Bimanual: 2 cm nodularity on the right pelvis, the nodule does not extend to the sidewall. The left side is negative. RV confirms.    Lab Review Lab Results  Component  Value Date   CA125 76.8 (H) 05/03/2016     Lab Results  Component Value Date   WBC 2.8 (L) 05/10/2016   HGB 9.0 (L) 05/10/2016   HCT 26.2 (L) 05/10/2016   MCV 88.7 05/10/2016   PLT 153 05/10/2016     Chemistry      Component Value Date/Time   NA 132 (L) 05/10/2016 0840   NA 134 (L) 09/27/2012 0459   K 3.9 05/10/2016 0840  K 4.5 09/27/2012 0459   CL 96 (L) 05/10/2016 0840   CL 100 09/27/2012 0459   CO2 28 05/10/2016 0840   CO2 28 09/27/2012 0459   BUN 27 (H) 05/10/2016 0840   BUN 8 09/27/2012 0459   CREATININE 0.83 05/10/2016 0840   CREATININE 0.60 09/27/2012 0459      Component Value Date/Time   CALCIUM 8.7 (L) 05/10/2016 0840   CALCIUM 8.5 09/27/2012 0459   ALKPHOS 65 05/10/2016 0840   ALKPHOS 100 09/21/2012 1210   AST 22 05/10/2016 0840   AST 36 09/21/2012 1210   ALT 14 05/10/2016 0840   ALT 23 09/21/2012 1210   BILITOT 0.5 05/10/2016 0840   BILITOT 0.3 04/08/2016 0751   BILITOT 0.7 09/21/2012 1210         Assessment:  [name] is a 71 y.o. 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   Symptomatic right pleural effusion s/p thoracenteses complicated by right-sided empyema and perihepatic abscess.  Platinum-sensitive recurrence with evidence of response based on CA125. Pelvic exam noted for isolated nodule.   Medical co-morbidities complicating care: HTN, insulin dependent  diabetes with SQ infusion pump, CAD, rheumatoid arthritis, asymptomatic gall stones.   Plan:   Problem List Items Addressed This Visit      Genitourinary   Malignant neoplasm of right ovary ([place]) - Primary    Other Visit Diagnoses    Colostomy present [facility])         She will follow up with Dr Mike Gip for continued chemotherapy. She will follow up with Dr. Kem Parkinson team regarding her other symptoms.    We can see her back in 3 months or as needed for repeat pelvic exam to follow the nodule.   Gillis Ends, MD  CC:   Referring Provider: Lequita Asal, MD  Additional Care Provider: Dr. Harvest Dark. Dr. Percell Boston

## 2016-05-11 NOTE — Progress Notes (Signed)
Patient here for follow up no complaints today no changes since last appointment.

## 2016-05-11 NOTE — Progress Notes (Signed)
  Oncology Nurse Navigator Documentation Chaperoned pelvic exam. No needs at present. Navigator Location: [facility] (05/11/16 1400)   )Navigator Encounter Type: Follow-up Appt (05/11/16 1400)                                                    Time Spent with Patient: 15 (05/11/16 1400)

## 2016-05-12 ENCOUNTER — Inpatient Hospital Stay: Payer: Medicare Other

## 2016-05-12 DIAGNOSIS — E10621 Type 1 diabetes mellitus with foot ulcer: Secondary | ICD-10-CM | POA: Diagnosis not present

## 2016-05-12 LAB — COPPER, SERUM: Copper: 167 ug/dL — ABNORMAL HIGH (ref 72–166)

## 2016-05-12 MED ORDER — TBO-FILGRASTIM 300 MCG/0.5ML ~~LOC~~ SOSY
300.0000 ug | PREFILLED_SYRINGE | Freq: Once | SUBCUTANEOUS | Status: AC
  Administered 2016-05-12: 300 ug via SUBCUTANEOUS
  Filled 2016-05-12: qty 0.5

## 2016-05-12 MED ORDER — FILGRASTIM 300 MCG/0.5ML IJ SOSY: 300.0000 ug | PREFILLED_SYRINGE | Freq: Once | INTRAMUSCULAR | Status: DC

## 2016-05-13 ENCOUNTER — Encounter: Payer: Medicare Other | Admitting: Surgery

## 2016-05-13 ENCOUNTER — Inpatient Hospital Stay: Payer: Medicare Other

## 2016-05-13 DIAGNOSIS — C569 Malignant neoplasm of unspecified ovary: Secondary | ICD-10-CM

## 2016-05-13 DIAGNOSIS — C801 Malignant (primary) neoplasm, unspecified: Secondary | ICD-10-CM

## 2016-05-13 DIAGNOSIS — E10621 Type 1 diabetes mellitus with foot ulcer: Secondary | ICD-10-CM | POA: Diagnosis not present

## 2016-05-13 DIAGNOSIS — C786 Secondary malignant neoplasm of retroperitoneum and peritoneum: Secondary | ICD-10-CM

## 2016-05-13 DIAGNOSIS — D72819 Decreased white blood cell count, unspecified: Secondary | ICD-10-CM

## 2016-05-13 LAB — CBC WITH DIFFERENTIAL/PLATELET
Basophils Absolute: 0 10*3/uL (ref 0–0.1)
Basophils Relative: 0 %
Eosinophils Absolute: 0 10*3/uL (ref 0–0.7)
Eosinophils Relative: 0 %
HCT: 25.3 % — ABNORMAL LOW (ref 35.0–47.0)
Hemoglobin: 8.4 g/dL — ABNORMAL LOW (ref 12.0–16.0)
Lymphocytes Relative: 3 %
Lymphs Abs: 0.6 10*3/uL — ABNORMAL LOW (ref 1.0–3.6)
MCH: 29.8 pg (ref 26.0–34.0)
MCHC: 33.1 g/dL (ref 32.0–36.0)
MCV: 90 fL (ref 80.0–100.0)
Monocytes Absolute: 0.1 10*3/uL — ABNORMAL LOW (ref 0.2–0.9)
Monocytes Relative: 1 %
Neutro Abs: 17.7 10*3/uL — ABNORMAL HIGH (ref 1.4–6.5)
Neutrophils Relative %: 96 %
Platelets: 108 10*3/uL — ABNORMAL LOW (ref 150–440)
RBC: 2.81 MIL/uL — ABNORMAL LOW (ref 3.80–5.20)
RDW: 17.2 % — ABNORMAL HIGH (ref 11.5–14.5)
WBC: 18.5 10*3/uL — ABNORMAL HIGH (ref 3.6–11.0)

## 2016-05-13 MED ORDER — FILGRASTIM 300 MCG/0.5ML IJ SOSY
300.0000 ug | PREFILLED_SYRINGE | Freq: Once | INTRAMUSCULAR | Status: DC
  Filled 2016-05-13: qty 0.5

## 2016-05-13 MED ORDER — TBO-FILGRASTIM 300 MCG/0.5ML ~~LOC~~ SOSY
300.0000 ug | PREFILLED_SYRINGE | Freq: Once | SUBCUTANEOUS | Status: AC
  Administered 2016-05-13: 300 ug via SUBCUTANEOUS
  Filled 2016-05-13: qty 0.5

## 2016-05-14 NOTE — Progress Notes (Signed)
[name] ([id number]) Visit Report for 05/13/2016 Chief Complaint Document Details Patient Name: [name], [name] 05/13/2016 11:00 Date of Service: AM Medical Record [id number] Number: Patient Account Number: [id number] 04/01/1946 (71 y.o. Treating RN: Baruch Gouty, RN, BSN, Velva Harman Date of Birth/Sex: Female) Other Clinician: Primary Care Physician: Caryl Bis, ERIC Treating Kebin Maye Referring Physician: Caryl Bis, ERIC Physician/Extender: Suella Grove in Treatment: 19 Information Obtained from: Patient Chief Complaint [name] presents for evaluation of her left posterior heel pressure ulcer Electronic Signature(s) Signed: 05/13/2016 11:38:02 AM By: Christin Fudge MD, FACS Entered By: Christin Fudge on 05/13/2016 11:38:02 [name] ([id number]) -------------------------------------------------------------------------------- HPI Details Patient Name: [name], [name] 05/13/2016 11:00 Date of Service: AM Medical Record [id number] Number: Patient Account Number: [id number] [birth year] (71 y.o. Treating RN: Afful, RN, BSN, Velva Harman Date of Birth/Sex: Female) Other Clinician: Primary Care Physician: Caryl Bis, ERIC Treating Tharun Cappella Referring Physician: Caryl Bis, ERIC Physician/Extender: Weeks in Treatment: 19 History of Present Illness Location: right heel Quality: Patient reports experiencing a dull pain to affected area(s). Severity: Patient states wound are getting worse. Duration: Patient has had the wound for <5 months prior to presenting for treatment Timing: Pain in wound is Intermittent (comes and goes Context: The wound appeared gradually over time Modifying Factors: Consults to this date include: local care was done as per the medication given by the PCP which may have been medihoney Associated Signs and Symptoms: Patient reports having difficulty standing for long periods. 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 In the past she was seen for a decubitus ulcer on her right gluteal area. She is known to have diabetes mellitus type 1 and her most recent A1c was 7.2% Past medical history significant for leukopenia, cholelithiasis, hypertension, chronic diastolic CHF, coronary artery disease,type 1 diabetes mellitus, rheumatoid arthritis, collagen vascular disease, ovarian cancer, status post CABG in 2014 and status post Port-A-Cath insertion by Dr. Leotis Pain in October 2016. She has quit smoking about 20 years ago. She was advised to use Medihoney with calcium alginate pads to be applied over the wound. 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 01/07/2016 -- x-ray of the left heel done -- IMPRESSION: No radiographic evidence of osteomyelitis. If this remains a clinical concern, recommend MR imaging. 02/05/16: returns today for f/u. denies fever, chills, body aches or malaise. no interval changes regarding health status. 02/12/16: pt is doing well. wound has improved. no s/s of infection. no systemic s/s of infection. She reports will be on vacation next week, and requests appointment in 2 weeks. 03/18/2016 - is going to be starting her chemotherapy this coming Monday and will have cycles every 3 weeks 04-01-16 [name], accompanied by her husband, presents for evaluation of her left posterior heel stage III pressure ulcer. She states that she started chemotherapy last week. Her chemotherapy is on a cycle of one week on 3 weeks off, with her next  treatment scheduled for December 11. There has been an increase [name], [name]. ([id number]) in drainage since her last appointment. She denies any  pain. They have been using PolyMem silver rope. 04/22/16 - [name] presents today, accompanied by her husband, for a violation of her left posterior low heel pressure ulcer. She continues to receive chemotherapy, with the exception of last week due to neutropenia. Her next scheduled chemotherapy is on January 2. She states that her chemotherapy schedule is 1 day on 8 days off. She and her husband deny any overt changes to her wound, denies pain, denies any increased drainage. 04/29/2016 -- the copayment for the skin substitute is about $300 a week and they have declined use of this material. Electronic Signature(s) Signed: 05/13/2016 11:38:08 AM By: Christin Fudge MD, FACS Entered By: Christin Fudge on 05/13/2016 11:38:07 [name] ([id number]) -------------------------------------------------------------------------------- Physical Exam Details Patient Name: [name], [name] 05/13/2016 11:00 Date of Service: AM Medical Record [id number] Number: Patient Account Number: [id number] [birth year] (71 y.o. Treating RN: Baruch Gouty, RN, BSN, Velva Harman Date of Birth/Sex: Female) Other Clinician: Primary Care Physician: Caryl Bis, ERIC Treating Quinley Nesler Referring Physician: Caryl Bis, ERIC Physician/Extender: Weeks in Treatment: 19 Constitutional . Pulse regular. Respirations normal and unlabored. Afebrile. . Eyes Nonicteric. Reactive to light. Ears, Nose, Mouth, and Throat Lips, teeth, and gums WNL.Marland Kitchen Moist mucosa without lesions. Neck supple and nontender. No palpable supraclavicular or cervical adenopathy. Normal sized without goiter. Respiratory WNL. No retractions.. Breath sounds WNL, No rubs, rales, rhonchi, or wheeze.. Cardiovascular Heart rhythm and rate regular, no murmur or gallop.. Pedal Pulses WNL. No clubbing, cyanosis or  edema. Lymphatic No adneopathy. No adenopathy. No adenopathy. Musculoskeletal Adexa without tenderness or enlargement.. Digits and nails w/o clubbing, cyanosis, infection, petechiae, ischemia, or inflammatory conditions.. Integumentary (Hair, Skin) No suspicious lesions. No crepitus or fluctuance. No peri-wound warmth or erythema. No masses.Marland Kitchen Psychiatric Judgement and insight Intact.. No evidence of depression, anxiety, or agitation.. Notes the lymphedema has gone down nicely and the wound is looking clean and after washing out with saline and gauze we have placed in another strip of Endoform into the depths of the wound and covered it appropriately Electronic Signature(s) Signed: 05/13/2016 11:38:41 AM By: Christin Fudge MD, FACS Entered By: Christin Fudge on 05/13/2016 11:38:41 [name] ([id number]) -------------------------------------------------------------------------------- Physician Orders Details Patient Name: [name], [name]. 05/13/2016 11:00 Date of Service: AM Medical Record [id number] Number: Patient Account Number: [id number] Jan 10, 1946 (71 y.o. Treating RN: Baruch Gouty, RN, BSN, Velva Harman Date of Birth/Sex: Female) Other Clinician: Primary Care Physician: Caryl Bis, ERIC Treating Rayla Pember Referring Physician: Caryl Bis, ERIC Physician/Extender: Suella Grove in Treatment: 26 Verbal / Phone Orders: Yes Clinician: Afful, RN, BSN, Rita Read Back and Verified: Yes Diagnosis Coding Wound Cleansing Wound #2 Left Calcaneus o Clean wound with Normal Saline. Primary Wound Dressing Wound #2 Left Calcaneus o Other: - Endoform Secondary Dressing Wound #2 Left Calcaneus o Dry Gauze - 2x2 o Boardered Foam Dressing Dressing Change Frequency Wound #2 Left Calcaneus o Change dressing every week - can change it if it gets wet and /or comes off. Cut another strip and insert Follow-up Appointments Wound #2 Left Calcaneus o Return Appointment in 1 week. Edema  Control Wound #2 Left Calcaneus o Patient to wear own compression stockings - Patient instructed to wear 20-30 mmHg gradient o Elevate legs to the level of the heart and pump ankles as often as possible o Support Garment 20-30 mm/Hg pressure to: Additional Orders / Instructions Wound #2 Left Calcaneus o Activity as tolerated [name], [name] ([id number]) Electronic Signature(s) Signed: 05/13/2016 3:43:30 PM By: Regan Lemming BSN, RN Signed: 05/13/2016 4:00:20 PM By: Christin Fudge MD,  FACS Entered By: Regan Lemming on 05/13/2016 11:11:44 [name] ([id number]) -------------------------------------------------------------------------------- Problem List Details Patient Name: [name], [name] 05/13/2016 11:00 Date of Service: AM Medical Record [id number] Number: Patient Account Number: [id number] 1945/07/26 (71 y.o. Treating RN: Baruch Gouty, RN, BSN, Velva Harman Date of Birth/Sex: Female) Other Clinician: Primary Care Physician: Caryl Bis, ERIC Treating Christin Fudge Referring Physician: Caryl Bis, ERIC Physician/Extender: Weeks in Treatment: 19 Active Problems ICD-10 Encounter Code Description Active Date Diagnosis E10.621 Type 1 diabetes mellitus with foot ulcer 12/31/2015 Yes L89.623 Pressure ulcer of left heel, stage 3 12/31/2015 Yes Z92.21 Personal history of antineoplastic chemotherapy 12/31/2015 Yes Z79.52 Long term (current) use of systemic steroids 12/31/2015 Yes Inactive Problems Resolved Problems Electronic Signature(s) Signed: 05/13/2016 11:37:51 AM By: Christin Fudge MD, FACS Entered By: Christin Fudge on 05/13/2016 11:37:50 [name] ([id number]) -------------------------------------------------------------------------------- Progress Note Details Patient Name: [name], [name]. 05/13/2016 11:00 Date of Service: AM Medical Record [id number] Number: Patient Account Number: [id number] [birth year] (71 y.o. Treating RN: Baruch Gouty, RN, BSN, Velva Harman Date of  Birth/Sex: Female) Other Clinician: Primary Care Physician: Caryl Bis, ERIC Treating Scherry Laverne Referring Physician: Caryl Bis, ERIC Physician/Extender: Suella Grove in Treatment: 19 Subjective Chief Complaint Information obtained from Patient Mrs. Norberg presents for evaluation of her left posterior heel pressure ulcer History of Present Illness (HPI) The following HPI elements were documented for the patient's wound: Location: right heel Quality: Patient reports experiencing a dull pain to affected area(s). Severity: Patient states wound are getting worse. Duration: Patient has had the wound for Timing: Pain in wound is Intermittent (comes and goes Context: The wound appeared gradually over time Modifying Factors: Consults to this date include: local care was done as per the medication given by the PCP which may have been medihoney Associated Signs and Symptoms: Patient reports having difficulty standing for long periods. 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 In the past she was seen for a decubitus ulcer on her right gluteal area. She is known to have diabetes mellitus type 1 and her most recent A1c was 7.2% Past medical history significant for leukopenia, cholelithiasis, hypertension, chronic diastolic CHF, coronary artery disease,type 1 diabetes mellitus, rheumatoid arthritis, collagen vascular disease, ovarian cancer, status post CABG in 2014 and status post Port-A-Cath insertion by Dr. Leotis Pain in October 2016. She has quit smoking about 20 years ago. She was advised to use Medihoney with calcium alginate pads to be applied over the wound. 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 01/07/2016 -- x-ray of the left heel done -- IMPRESSION: No radiographic evidence of osteomyelitis. If this remains a clinical concern, recommend MR imaging. 02/05/16: returns today for f/u. denies fever, chills, body aches or malaise. no interval changes regarding health status. 02/12/16: pt is doing well. wound has improved. no s/s of infection. no systemic s/s of infection. She reports [name], [name]. ([id number]) will be on vacation next week, and requests appointment in 2 weeks. 03/18/2016 - is going to be starting her chemotherapy this coming Monday and will have cycles every 3 weeks 04-01-16 [name],  accompanied by her husband, presents for evaluation of her left posterior heel stage III pressure ulcer. She states that she started chemotherapy last week. Her chemotherapy is on a cycle of one week on 3 weeks off, with her next treatment scheduled for December 11. There has been an increase in drainage since her last appointment. She denies any pain. They have been using PolyMem silver rope. 04/22/16 - Mrs. Redhead presents today, accompanied by her husband, for a violation of her left posterior low heel pressure ulcer. She continues to receive chemotherapy, with the exception of last week due to neutropenia. Her next scheduled chemotherapy is on January 2. She states that her chemotherapy schedule is 1 day on 8 days off. She and her husband deny any overt changes to her wound, denies pain, denies any increased drainage. 04/29/2016 -- the copayment for the skin substitute is about $300 a week and they have declined use of this material. Objective Constitutional Pulse regular. Respirations normal and unlabored. Afebrile. Vitals Time Taken: 10:38 AM, Height: 63 in, Weight: 130 lbs, BMI: 23,  Temperature: 98.4 F, Pulse: 81 bpm, Respiratory Rate: 16 breaths/min, Blood Pressure: 109/40 mmHg. Eyes Nonicteric. Reactive to light. Ears, Nose, Mouth, and Throat Lips, teeth, and gums WNL.Marland Kitchen Moist mucosa without lesions. Neck supple and nontender. No palpable supraclavicular or cervical adenopathy. Normal sized without goiter. Respiratory WNL. No retractions.. Breath sounds WNL, No rubs, rales, rhonchi, or wheeze.. Cardiovascular Heart rhythm and rate regular, no murmur or gallop.. Pedal Pulses WNL. No clubbing, cyanosis or edema. Lymphatic No adneopathy. No adenopathy. No adenopathy. Musculoskeletal [name], [name] ([id number]) Adexa without tenderness or enlargement.. Digits and nails w/o clubbing, cyanosis, infection, petechiae, ischemia, or inflammatory conditions.Marland Kitchen Psychiatric Judgement and insight Intact.. No evidence of depression, anxiety, or agitation.. General Notes: the lymphedema has gone down nicely and the wound is looking clean and after washing out with saline and gauze we have placed in another strip of Endoform into the depths of the wound and covered it appropriately Integumentary (Hair, Skin) No suspicious lesions. No crepitus or fluctuance. No peri-wound warmth or erythema. No masses.. Wound #2 status is Open. Original cause of wound was Pressure Injury. The wound is located on the Left Calcaneus. The wound measures 0.1cm length x 0.1cm width x 0.1cm depth; 0.008cm^2 area and 0.001cm^3 volume. There is fat exposed. There is no tunneling or undermining noted. There is a small amount of sanguinous drainage noted. The wound margin is flat and intact. There is medium (34-66%) pink, pale granulation within the wound bed. There is a small (1-33%) amount of necrotic tissue within the wound bed including Adherent Slough. The periwound skin appearance exhibited: Induration, Moist. The periwound skin appearance did not exhibit: Callus, Crepitus, Excoriation, Fluctuance,  Friable, Localized Edema, Rash, Scarring, Dry/Scaly, Maceration, Atrophie Blanche, Cyanosis, Ecchymosis, Hemosiderin Staining, Mottled, Pallor, Rubor, Erythema. Periwound temperature was noted as No Abnormality. The periwound has tenderness on palpation. Assessment Active Problems ICD-10 E10.621 - Type 1 diabetes mellitus with foot ulcer L89.623 - Pressure ulcer of left heel, stage 3 Z92.21 - Personal history of antineoplastic chemotherapy Z79.52 - Long term (current) use of systemic steroids Plan Wound Cleansing: Wound #2 Left Calcaneus: Clean wound with Normal Saline. Primary Wound Dressing: Wound #2 Left Calcaneus: [name], [name]. ([id number]) Other: - Endoform Secondary Dressing: Wound #2 Left Calcaneus: Dry Gauze - 2x2 Boardered Foam Dressing Dressing Change Frequency: Wound #2 Left Calcaneus: Change dressing every week - can change it if it gets wet and /or comes off. Cut  another strip and insert Follow-up Appointments: Wound #2 Left Calcaneus: Return Appointment in 1 week. Edema Control: Wound #2 Left Calcaneus: Patient to wear own compression stockings - Patient instructed to wear 20-30 mmHg gradient Elevate legs to the level of the heart and pump ankles as often as possible Support Garment 20-30 mm/Hg pressure to: Additional Orders / Instructions: Wound #2 Left Calcaneus: Activity as tolerated I have packed Endoform into the wound and applied an appropriate dressing over this. She will change it if required and see me back next week. she has a bit of lower extremity lymphedema and I was asked to develop compression stockings and to be careful about offloading. Nutrition, vitamins have also been discussed with her. She is still taking her chemotherapy currently. Electronic Signature(s) Signed: 05/13/2016 11:39:15 AM By: Christin Fudge MD, FACS Entered By: Christin Fudge on 05/13/2016 11:39:15 [name]  ([id number]) -------------------------------------------------------------------------------- SuperBill Details Patient Name: [name] Date of Service: 05/13/2016 Medical Record Patient Account Number: [id number] Number: Afful, RN, BSN, Treating RN: [birth year] (71 y.o. Velva Harman Date of Birth/Sex: Female) Other Clinician: Primary Care Physician: Caryl Bis, ERIC Treating Christin Fudge Referring Physician: Caryl Bis, ERIC Physician/Extender: Suella Grove in Treatment: 19 Diagnosis Coding ICD-10 Codes Code Description E10.621 Type 1 diabetes mellitus with foot ulcer L89.623 Pressure ulcer of left heel, stage 3 Z92.21 Personal history of antineoplastic chemotherapy Z79.52 Long term (current) use of systemic steroids Facility Procedures CPT4 Code: [id number] Description: [id number] - WOUND CARE VISIT-LEV 2 EST PT Modifier: Quantity: 1 Physician Procedures CPT4 Code: [id number] Description: [id number] - WC PHYS LEVEL 3 - EST PT ICD-10 Description Diagnosis E10.621 Type 1 diabetes mellitus with foot ulcer L89.623 Pressure ulcer of left heel, stage 3 Z92.21 Personal history of antineoplastic chemothera Z79.52 Long term (current) use  of systemic steroids Modifier: py Quantity: 1 Electronic Signature(s) Signed: 05/13/2016 11:39:26 AM By: Christin Fudge MD, FACS Entered By: Christin Fudge on 05/13/2016 11:39:26

## 2016-05-14 NOTE — Progress Notes (Signed)
[name], [name] ([id number]) Visit Report for 05/13/2016 Arrival Information Details Patient Name: [name], [name] Date of Service: 05/13/2016 11:00 AM Medical Record Number: [id number] Patient Account Number: [id number] Date of Birth/Sex: 02-Apr-1946 (71 y.o. Female) Treating RN: Afful, RN, BSN, Velva Harman Primary Care Physician: Caryl Bis, ERIC Other Clinician: Referring Physician: Caryl Bis, ERIC Treating Physician/Extender: Frann Rider in Treatment: 19 Visit Information History Since Last Visit All ordered tests and consults were completed: No Patient Arrived: Cane Added or deleted any medications: No Arrival Time: 10:35 Any new allergies or adverse reactions: No Accompanied By: hubby Had a fall or experienced change in No Transfer Assistance: None activities of daily living that may affect Patient Identification Verified: Yes risk of falls: Secondary Verification Process Yes Signs or symptoms of abuse/neglect since last No Completed: visito Patient Requires Transmission- No Hospitalized since last visit: No Based Precautions: Has Dressing in Place as Prescribed: Yes Patient Has Alerts: Yes Pain Present Now: Yes Patient Alerts: Patient on Blood Thinner eliquis BID DMII Electronic Signature(s) Signed: 05/13/2016 3:43:30 PM By: Regan Lemming BSN, RN Entered By: Regan Lemming on 05/13/2016 10:35:46 [name] ([id number]) -------------------------------------------------------------------------------- Clinic Level of Care Assessment Details Patient Name: [name] Date of Service: 05/13/2016 11:00 AM Medical Record Number: [id number] Patient Account Number: [id number] Date of Birth/Sex: Aug 31, 1945 (71 y.o. Female) Treating RN: Afful, RN, BSN, [place] Primary Care Physician: Caryl Bis, ERIC Other Clinician: Referring Physician: Caryl Bis, ERIC Treating Physician/Extender: Frann Rider in Treatment: 19 Clinic Level of Care Assessment Items TOOL 4  Quantity Score []  - Use when only an EandM is performed on FOLLOW-UP visit 0 ASSESSMENTS - Nursing Assessment / Reassessment X - Reassessment of Co-morbidities (includes updates in patient status) 1 10 X - Reassessment of Adherence to Treatment Plan 1 5 ASSESSMENTS - Wound and Skin Assessment / Reassessment X - Simple Wound Assessment / Reassessment - one wound 1 5 []  - Complex Wound Assessment / Reassessment - multiple wounds 0 []  - Dermatologic / Skin Assessment (not related to wound area) 0 ASSESSMENTS - Focused Assessment []  - Circumferential Edema Measurements - multi extremities 0 []  - Nutritional Assessment / Counseling / Intervention 0 X - Lower Extremity Assessment (monofilament, tuning fork, pulses) 1 5 []  - Peripheral Arterial Disease Assessment (using hand held doppler) 0 ASSESSMENTS - Ostomy and/or Continence Assessment and Care []  - Incontinence Assessment and Management 0 []  - Ostomy Care Assessment and Management (repouching, etc.) 0 PROCESS - Coordination of Care X - Simple Patient / Family Education for ongoing care 1 15 []  - Complex (extensive) Patient / Family Education for ongoing care 0 []  - Staff obtains Programmer, systems, Records, Test Results / Process Orders 0 []  - Staff telephones HHA, Nursing Homes / Clarify orders / etc 0 []  - Routine Transfer to another Facility (non-emergent condition) 0 [name], CANAVAN. ([id number]) []  - Routine Hospital Admission (non-emergent condition) 0 []  - New Admissions / Biomedical engineer / Ordering NPWT, Apligraf, etc. 0 []  - Emergency Hospital Admission (emergent condition) 0 []  - Simple Discharge Coordination 0 []  - Complex (extensive) Discharge Coordination 0 PROCESS - Special Needs []  - Pediatric / Minor Patient Management 0 []  - Isolation Patient Management 0 []  - Hearing / Language / Visual special needs 0 []  - Assessment of Community assistance (transportation, D/C planning, etc.) 0 []  - Additional assistance / Altered  mentation 0 []  - Support Surface(s) Assessment (bed, cushion, seat, etc.) 0 INTERVENTIONS - Wound Cleansing / Measurement X - Simple Wound Cleansing - one wound 1 5 []  -  Complex Wound Cleansing - multiple wounds 0 X - Wound Imaging (photographs - any number of wounds) 1 5 []  - Wound Tracing (instead of photographs) 0 X - Simple Wound Measurement - one wound 1 5 []  - Complex Wound Measurement - multiple wounds 0 INTERVENTIONS - Wound Dressings X - Small Wound Dressing one or multiple wounds 1 10 []  - Medium Wound Dressing one or multiple wounds 0 []  - Large Wound Dressing one or multiple wounds 0 []  - Application of Medications - topical 0 []  - Application of Medications - injection 0 INTERVENTIONS - Miscellaneous []  - External ear exam 0 [name], TROUTWINE. ([id number]) []  - Specimen Collection (cultures, biopsies, blood, body fluids, etc.) 0 []  - Specimen(s) / Culture(s) sent or taken to Lab for analysis 0 []  - Patient Transfer (multiple staff / Harrel Lemon Lift / Similar devices) 0 []  - Simple Staple / Suture removal (25 or less) 0 []  - Complex Staple / Suture removal (26 or more) 0 []  - Hypo / Hyperglycemic Management (close monitor of Blood Glucose) 0 []  - Ankle / Brachial Index (ABI) - do not check if billed separately 0 X - Vital Signs 1 5 Has the patient been seen at the hospital within the last three years: Yes Total Score: 70 Level Of Care: New/Established - Level 2 Electronic Signature(s) Signed: 05/13/2016 3:43:30 PM By: Regan Lemming BSN, RN Entered By: Regan Lemming on 05/13/2016 11:12:13 [name] ([id number]) -------------------------------------------------------------------------------- Encounter Discharge Information Details Patient Name: [name] Date of Service: 05/13/2016 11:00 AM Medical Record Number: [id number] Patient Account Number: [id number] Date of Birth/Sex: January 09, 1946 (71 y.o. Female) Treating RN: Baruch Gouty, RN, BSN, Velva Harman Primary Care Physician:  Caryl Bis, ERIC Other Clinician: Referring Physician: Caryl Bis, ERIC Treating Physician/Extender: Frann Rider in Treatment: 81 Encounter Discharge Information Items Discharge Pain Level: 0 Discharge Condition: Stable Ambulatory Status: Cane Discharge Destination: Home Transportation: Private Auto Accompanied By: HUBBY Schedule Follow-up Appointment: No Medication Reconciliation completed No and provided to Patient/Care Christorpher Hisaw: Provided on Clinical Summary of Care: 05/13/2016 Form Type Recipient Paper Patient SR Electronic Signature(s) Signed: 05/13/2016 3:43:30 PM By: Regan Lemming BSN, RN Previous Signature: 05/13/2016 11:12:21 AM Version By: Ruthine Dose Entered By: Regan Lemming on 05/13/2016 11:14:42 [name] ([id number]) -------------------------------------------------------------------------------- Lower Extremity Assessment Details Patient Name: [name] Date of Service: 05/13/2016 11:00 AM Medical Record Number: [id number] Patient Account Number: [id number] Date of Birth/Sex: 07/24/1945 (70 y.o. Female) Treating RN: Afful, RN, BSN, Velva Harman Primary Care Physician: Caryl Bis, ERIC Other Clinician: Referring Physician: Caryl Bis, ERIC Treating Physician/Extender: Frann Rider in Treatment: 19 Vascular Assessment Claudication: Claudication Assessment [Left:None] Pulses: Dorsalis Pedis Palpable: [Left:Yes] Posterior Tibial Extremity colors, hair growth, and conditions: Extremity Color: [Left:Normal] Hair Growth on Extremity: [Left:No] Temperature of Extremity: [Left:Warm] Capillary Refill: [Left:< 3 seconds] Electronic Signature(s) Signed: 05/13/2016 3:43:30 PM By: Regan Lemming BSN, RN Entered By: Regan Lemming on 05/13/2016 10:36:41 [name] ([id number]) -------------------------------------------------------------------------------- Multi Wound Chart Details Patient Name: [name] Date of Service: 05/13/2016 11:00  AM Medical Record Number: [id number] Patient Account Number: [id number] Date of Birth/Sex: 25-Oct-1945 (70 y.o. Female) Treating RN: Baruch Gouty, RN, BSN, Velva Harman Primary Care Physician: Caryl Bis, ERIC Other Clinician: Referring Physician: Caryl Bis, ERIC Treating Physician/Extender: Frann Rider in Treatment: 19 Vital Signs Height(in): 63 Pulse(bpm): 81 Weight(lbs): 130 Blood Pressure 109/40 (mmHg): Body Mass Index(BMI): 23 Temperature(F): 98.4 Respiratory Rate 16 (breaths/min): Photos: [2:No Photos] [N/A:N/A] Wound Location: [2:Left Calcaneus] [N/A:N/A] Wounding Event: [2:Pressure Injury] [N/A:N/A] Primary Etiology: [2:Pressure Ulcer] [N/A:N/A] Secondary Etiology: [2:Diabetic  Wound/Ulcer of the Lower Extremity] [N/A:N/A] Comorbid History: [2:Cataracts, Type I Diabetes, Rheumatoid Arthritis, Osteoarthritis, Neuropathy, Received Chemotherapy] [N/A:N/A] Date Acquired: [2:07/15/2015] [N/A:N/A] Weeks of Treatment: [2:19] [N/A:N/A] Wound Status: [2:Open] [N/A:N/A] Measurements L x W x D 0.1x0.1x0.1 [N/A:N/A] (cm) Area (cm) : [2:0.008] [N/A:N/A] Volume (cm) : [2:0.001] [N/A:N/A] % Reduction in Area: [2:96.60%] [N/A:N/A] % Reduction in Volume: 98.90% [N/A:N/A] Classification: [2:Category/Stage III] [N/A:N/A] HBO Classification: [2:Grade 1] [N/A:N/A] Exudate Amount: [2:Small] [N/A:N/A] Exudate Type: [2:Sanguinous] [N/A:N/A] Exudate Color: [2:red] [N/A:N/A] Wound Margin: [2:Flat and Intact] [N/A:N/A] Granulation Amount: [2:Medium (34-66%)] [N/A:N/A] Granulation Quality: [2:Pink, Pale] [N/A:N/A] Necrotic Amount: [2:Small (1-33%)] [N/A:N/A] Exposed Structures: [N/A:N/A] Fat: Yes Fascia: No Tendon: No Muscle: No Joint: No Bone: No Epithelialization: Medium (34-66%) N/A N/A Periwound Skin Texture: Induration: Yes N/A N/A Edema: No Excoriation: No Callus: No Crepitus: No Fluctuance: No Friable: No Rash: No Scarring: No Periwound Skin Moist: Yes N/A  N/A Moisture: Maceration: No Dry/Scaly: No Periwound Skin Color: Atrophie Blanche: No N/A N/A Cyanosis: No Ecchymosis: No Erythema: No Hemosiderin Staining: No Mottled: No Pallor: No Rubor: No Temperature: No Abnormality N/A N/A Tenderness on Yes N/A N/A Palpation: Wound Preparation: Ulcer Cleansing: N/A N/A Rinsed/Irrigated with Saline Topical Anesthetic Applied: Other: lidocaine 4% Treatment Notes Wound #2 (Left Calcaneus) 1. Cleansed with: Clean wound with Normal Saline 4. Dressing Applied: Other dressing (specify in notes) 5. Secondary Dressing Applied Bordered Foam Dressing Dry Gauze Notes Endoform [name], [name] ([id number]) Electronic Signature(s) Signed: 05/13/2016 11:37:55 AM By: Christin Fudge MD, FACS Entered By: Christin Fudge on 05/13/2016 11:37:55 [name] ([id number]) -------------------------------------------------------------------------------- [place] Details Patient Name: [name] Date of Service: 05/13/2016 11:00 AM Medical Record Number: [id number] Patient Account Number: [id number] Date of Birth/Sex: May 22, 1945 (70 y.o. Female) Treating RN: Afful, RN, BSN, [place] Primary Care Physician: Caryl Bis, ERIC Other Clinician: Referring Physician: Caryl Bis, ERIC Treating Physician/Extender: Frann Rider in Treatment: 43 Active Inactive Orientation to the Wound Care Program Nursing Diagnoses: Knowledge deficit related to the wound healing center program Goals: Patient/caregiver will verbalize understanding of the [place] Program Date Initiated: 12/31/2015 Goal Status: Active Interventions: Provide education on orientation to the wound center Notes: Pressure Nursing Diagnoses: Knowledge deficit related to causes and risk factors for pressure ulcer development Knowledge deficit related to management of pressures ulcers Potential for impaired tissue integrity related to pressure,  friction, moisture, and shear Goals: Patient will remain free from development of additional pressure ulcers Date Initiated: 12/31/2015 Goal Status: Active Patient will remain free of pressure ulcers Date Initiated: 12/31/2015 Goal Status: Active Patient/caregiver will verbalize risk factors for pressure ulcer development Date Initiated: 12/31/2015 Goal Status: Active Patient/caregiver will verbalize understanding of pressure ulcer management Date Initiated: 12/31/2015 Goal Status: Active Interventions: [name], [name] ([id number]) Assess: immobility, friction, shearing, incontinence upon admission and as needed Assess offloading mechanisms upon admission and as needed Assess potential for pressure ulcer upon admission and as needed Provide education on pressure ulcers Treatment Activities: Patient referred for pressure reduction/relief devices : 12/31/2015 Notes: Wound/Skin Impairment Nursing Diagnoses: Impaired tissue integrity Knowledge deficit related to smoking impact on wound healing Knowledge deficit related to ulceration/compromised skin integrity Goals: Patient/caregiver will verbalize understanding of skin care regimen Date Initiated: 12/31/2015 Goal Status: Active Ulcer/skin breakdown will have a volume reduction of 30% by week 4 Date Initiated: 12/31/2015 Goal Status: Active Ulcer/skin breakdown will have a volume reduction of 50% by week 8 Date Initiated: 12/31/2015 Goal Status: Active Ulcer/skin breakdown will have a volume reduction of 80% by week 12  Date Initiated: 12/31/2015 Goal Status: Active Ulcer/skin breakdown will heal within 14 weeks Date Initiated: 12/31/2015 Goal Status: Active Interventions: Assess patient/caregiver ability to obtain necessary supplies Assess patient/caregiver ability to perform ulcer/skin care regimen upon admission and as needed Assess ulceration(s) every visit Provide education on ulcer and skin care Treatment Activities: Skin  care regimen initiated : 12/31/2015 Topical wound management initiated : 12/31/2015 Notes: [name], [name] ([id number]) Electronic Signature(s) Signed: 05/13/2016 3:43:30 PM By: Regan Lemming BSN, RN Entered By: Regan Lemming on 05/13/2016 11:01:30 [name] ([id number]) -------------------------------------------------------------------------------- Pain Assessment Details Patient Name: [name] Date of Service: 05/13/2016 11:00 AM Medical Record Number: [id number] Patient Account Number: [id number] Date of Birth/Sex: 1946-02-20 (70 y.o. Female) Treating RN: Afful, RN, BSN, Velva Harman Primary Care Physician: Caryl Bis, ERIC Other Clinician: Referring Physician: Caryl Bis, ERIC Treating Physician/Extender: Frann Rider in Treatment: 19 Active Problems Location of Pain Severity and Description of Pain Patient Has Paino Yes Site Locations Pain Location: Pain in Ulcers Rate the pain. Current Pain Level: 3 Pain Management and Medication Current Pain Management: Electronic Signature(s) Signed: 05/13/2016 3:43:30 PM By: Regan Lemming BSN, RN Entered By: Regan Lemming on 05/13/2016 10:36:18 [name] ([id number]) -------------------------------------------------------------------------------- Patient/Caregiver Education Details Patient Name: [name] Date of Service: 05/13/2016 11:00 AM Medical Record Number: [id number] Patient Account Number: [id number] Date of Birth/Gender: 1945/09/16 (70 y.o. Female) Treating RN: Baruch Gouty, RN, BSN, Velva Harman Primary Care Physician: Caryl Bis, ERIC Other Clinician: Referring Physician: Caryl Bis, ERIC Treating Physician/Extender: Frann Rider in Treatment: 30 Education Assessment Education Provided To: Patient Education Topics Provided Pressure: Methods: Explain/Verbal Welcome To [facility]: Methods: Explain/Verbal Responses: State content correctly Wound/Skin Impairment: Methods:  Explain/Verbal Responses: State content correctly Electronic Signature(s) Signed: 05/13/2016 3:43:30 PM By: Regan Lemming BSN, RN Entered By: Regan Lemming on 05/13/2016 11:15:36 [name] ([id number]) -------------------------------------------------------------------------------- Wound Assessment Details Patient Name: [name] Date of Service: 05/13/2016 11:00 AM Medical Record Number: [id number] Patient Account Number: [id number] Date of Birth/Sex: 08/14/1945 (70 y.o. Female) Treating RN: Afful, RN, BSN, [place] Primary Care Physician: Caryl Bis, ERIC Other Clinician: Referring Physician: Caryl Bis, ERIC Treating Physician/Extender: Frann Rider in Treatment: 19 Wound Status Wound Number: 2 Primary Pressure Ulcer Etiology: Wound Location: Left Calcaneus Secondary Diabetic Wound/Ulcer of the Lower Wounding Event: Pressure Injury Etiology: Extremity Date Acquired: 07/15/2015 Wound Open Weeks Of Treatment: 19 Status: Clustered Wound: No Comorbid Cataracts, Type I Diabetes, History: Rheumatoid Arthritis, Osteoarthritis, Neuropathy, Received Chemotherapy Photos Photo Uploaded By: Regan Lemming on 05/13/2016 14:51:30 Wound Measurements Length: (cm) 0.1 Width: (cm) 0.1 Depth: (cm) 0.1 Area: (cm) 0.008 Volume: (cm) 0.001 % Reduction in Area: 96.6% % Reduction in Volume: 98.9% Epithelialization: Medium (34-66%) Tunneling: No Undermining: No Wound Description Classification: Category/Stage III Foul Odor Af Diabetic Severity (Wagner): Grade 1 [name], PETER. ([id number]) ter Cleansing: No Wound Margin: Flat and Intact Exudate Amount: Small Exudate Type: Sanguinous Exudate Color: red Wound Bed Granulation Amount: Medium (34-66%) Exposed Structure Granulation Quality: Pink, Pale Fascia Exposed: No Necrotic Amount: Small (1-33%) Fat Layer Exposed: Yes Necrotic Quality: Adherent Slough Tendon Exposed: No Muscle Exposed: No Joint Exposed: No Bone  Exposed: No Periwound Skin Texture Texture Color No Abnormalities Noted: No No Abnormalities Noted: No Callus: No Atrophie Blanche: No Crepitus: No Cyanosis: No Excoriation: No Ecchymosis: No Fluctuance: No Erythema: No Friable: No Hemosiderin Staining: No Induration: Yes Mottled: No Localized Edema: No Pallor: No Rash: No Rubor: No Scarring: No Temperature / Pain Moisture Temperature: No Abnormality No Abnormalities Noted: No Tenderness on  Palpation: Yes Dry / Scaly: No Maceration: No Moist: Yes Wound Preparation Ulcer Cleansing: Rinsed/Irrigated with Saline Topical Anesthetic Applied: Other: lidocaine 4%, Treatment Notes Wound #2 (Left Calcaneus) 1. Cleansed with: Clean wound with Normal Saline 4. Dressing Applied: Other dressing (specify in notes) 5. Secondary Dressing Applied Bordered Foam Dressing Dry Gauze Notes [name], [name] ([id number]) Endoform Electronic Signature(s) Signed: 05/13/2016 3:43:30 PM By: Regan Lemming BSN, RN Entered By: Regan Lemming on 05/13/2016 11:01:18 [name] ([id number]) -------------------------------------------------------------------------------- Vitals Details Patient Name: [name] Date of Service: 05/13/2016 11:00 AM Medical Record Number: [id number] Patient Account Number: [id number] Date of Birth/Sex: 07-03-1945 (70 y.o. Female) Treating RN: Afful, RN, BSN, [place] Primary Care Physician: Caryl Bis, ERIC Other Clinician: Referring Physician: Caryl Bis, ERIC Treating Physician/Extender: Frann Rider in Treatment: 19 Vital Signs Time Taken: 10:38 Temperature (F): 98.4 Height (in): 63 Pulse (bpm): 81 Weight (lbs): 130 Respiratory Rate (breaths/min): 16 Body Mass Index (BMI): 23 Blood Pressure (mmHg): 109/40 Reference Range: 80 - 120 mg / dl Electronic Signature(s) Signed: 05/13/2016 3:43:30 PM By: Regan Lemming BSN, RN Entered By: Regan Lemming on 05/13/2016 10:39:04

## 2016-05-16 ENCOUNTER — Inpatient Hospital Stay: Payer: Medicare Other

## 2016-05-16 ENCOUNTER — Other Ambulatory Visit: Payer: Self-pay | Admitting: Hematology and Oncology

## 2016-05-16 ENCOUNTER — Other Ambulatory Visit: Payer: Self-pay | Admitting: Oncology

## 2016-05-16 DIAGNOSIS — D72819 Decreased white blood cell count, unspecified: Secondary | ICD-10-CM

## 2016-05-16 DIAGNOSIS — C801 Malignant (primary) neoplasm, unspecified: Secondary | ICD-10-CM

## 2016-05-16 DIAGNOSIS — E10621 Type 1 diabetes mellitus with foot ulcer: Secondary | ICD-10-CM | POA: Diagnosis not present

## 2016-05-16 DIAGNOSIS — C569 Malignant neoplasm of unspecified ovary: Secondary | ICD-10-CM

## 2016-05-16 DIAGNOSIS — C786 Secondary malignant neoplasm of retroperitoneum and peritoneum: Secondary | ICD-10-CM

## 2016-05-16 LAB — CBC WITH DIFFERENTIAL/PLATELET
Basophils Absolute: 0 10*3/uL (ref 0–0.1)
Basophils Relative: 1 %
Eosinophils Absolute: 0 10*3/uL (ref 0–0.7)
Eosinophils Relative: 1 %
HCT: 27.2 % — ABNORMAL LOW (ref 35.0–47.0)
Hemoglobin: 9.2 g/dL — ABNORMAL LOW (ref 12.0–16.0)
Lymphocytes Relative: 28 %
Lymphs Abs: 0.7 10*3/uL — ABNORMAL LOW (ref 1.0–3.6)
MCH: 30.4 pg (ref 26.0–34.0)
MCHC: 33.9 g/dL (ref 32.0–36.0)
MCV: 89.6 fL (ref 80.0–100.0)
Monocytes Absolute: 0.5 10*3/uL (ref 0.2–0.9)
Monocytes Relative: 21 %
Neutro Abs: 1.2 10*3/uL — ABNORMAL LOW (ref 1.4–6.5)
Neutrophils Relative %: 49 %
Platelets: 66 10*3/uL — ABNORMAL LOW (ref 150–440)
RBC: 3.03 MIL/uL — ABNORMAL LOW (ref 3.80–5.20)
RDW: 16.8 % — ABNORMAL HIGH (ref 11.5–14.5)
WBC: 2.4 10*3/uL — ABNORMAL LOW (ref 3.6–11.0)

## 2016-05-16 MED ORDER — POTASSIUM CHLORIDE CRYS ER 20 MEQ PO TBCR: 20.0000 meq | EXTENDED_RELEASE_TABLET | Freq: Every day | ORAL | 0 refills | Status: DC

## 2016-05-16 MED ORDER — TBO-FILGRASTIM 300 MCG/0.5ML ~~LOC~~ SOSY
300.0000 ug | PREFILLED_SYRINGE | Freq: Once | SUBCUTANEOUS | Status: AC
  Administered 2016-05-16: 300 ug via SUBCUTANEOUS
  Filled 2016-05-16: qty 0.5

## 2016-05-16 MED ORDER — FILGRASTIM 300 MCG/0.5ML IJ SOSY: 300.0000 ug | PREFILLED_SYRINGE | Freq: Once | INTRAMUSCULAR | Status: DC

## 2016-05-20 ENCOUNTER — Ambulatory Visit: Payer: Medicare Other | Admitting: Surgery

## 2016-05-23 ENCOUNTER — Encounter: Payer: Medicare Other | Admitting: Surgery

## 2016-05-23 DIAGNOSIS — E10621 Type 1 diabetes mellitus with foot ulcer: Secondary | ICD-10-CM | POA: Diagnosis not present

## 2016-05-23 NOTE — Progress Notes (Addendum)
ESTY, BISCARDI ([id number]) Visit Report for 05/23/2016 Chief Complaint Document Details Patient Name: [name], [name] Date of Service: 05/23/2016 11:15 AM Medical Record Number: [id number] Patient Account Number: [id number] Date of Birth/Sex: 1946-01-18 (70 y.o. Female) Treating RN: Cornell Barman Primary Care Provider: Caryl Bis, ERIC Other Clinician: Referring Provider: Caryl Bis, ERIC Treating Provider/Extender: Frann Rider in Treatment: 20 Information Obtained from: Patient Chief Complaint [name] presents for evaluation of her left posterior heel pressure ulcer Electronic Signature(s) Signed: 05/23/2016 11:33:27 AM By: Christin Fudge MD, FACS Entered By: Christin Fudge on 05/23/2016 11:33:27 [name] ([id number]) -------------------------------------------------------------------------------- HPI Details Patient Name: [name] Date of Service: 05/23/2016 11:15 AM Medical Record Number: [id number] Patient Account Number: [id number] Date of Birth/Sex: 08/08/1945 (70 y.o. Female) Treating RN: Cornell Barman Primary Care Provider: Caryl Bis, ERIC Other Clinician: Referring Provider: Caryl Bis, ERIC Treating Provider/Extender: Frann Rider in Treatment: 20 History of Present Illness Location: right heel Quality: Patient reports experiencing a dull pain to affected area(s). Severity: Patient states wound are getting worse. Duration: Patient has had the wound for <5 months prior to presenting for treatment Timing: Pain in wound is Intermittent (comes and goes Context: The wound appeared gradually over time Modifying Factors: Consults to this date include: local care was done as per the medication given by the PCP which may have been medihoney Associated Signs and Symptoms: Patient reports having difficulty standing for long periods. 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 In the past she was seen for a decubitus ulcer on her right gluteal area. She is known to have diabetes mellitus type 1 and her most recent A1c was 7.2% Past medical history significant for leukopenia, cholelithiasis, hypertension, chronic diastolic CHF, coronary artery disease,type 1 diabetes mellitus, rheumatoid arthritis, collagen vascular disease, ovarian cancer, status post CABG in 2014 and status post Port-A-Cath insertion by Dr. Leotis Pain in October 2016. She has quit smoking about 20 years ago. She was advised to use Medihoney with calcium alginate pads to be applied over the wound. 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 01/07/2016 -- x-ray of the left heel done -- IMPRESSION: No radiographic evidence of osteomyelitis. If this remains a clinical concern, recommend MR imaging. 02/05/16: returns today for f/u. denies fever, chills, body aches or malaise. no interval changes regarding health status. 02/12/16: pt is doing well. wound has improved. no s/s of infection. no systemic s/s of infection. She reports will be on vacation next week, and requests appointment in 2 weeks. 03/18/2016 - is going to be starting her chemotherapy this coming Monday and will have cycles every 3 weeks 04-01-16 [name], accompanied by her husband, presents for evaluation of her left posterior heel stage III pressure ulcer. She states that she started chemotherapy last week. Her chemotherapy is on a cycle of one week on 3 weeks off, with her next treatment scheduled for December 11.  There has been an increase in drainage since her last appointment. She denies any pain. They have been using PolyMem silver rope.  [name], [name] ([id number]) 04/22/16 - [name] presents today, accompanied by her husband, for a violation of her left posterior low heel pressure ulcer. She continues to receive chemotherapy, with the exception of last week due to neutropenia. Her next scheduled chemotherapy is on January 2. She states that her chemotherapy schedule is 1 day on 8 days off. She and her husband deny any overt changes to her wound, denies pain, denies any increased drainage. 04/29/2016 -- the copayment for the skin substitute is about $300 a week and they have declined use of this material. Electronic Signature(s) Signed: 05/23/2016 11:33:32 AM By: Christin Fudge MD, FACS Entered By: Christin Fudge on 05/23/2016 11:33:32 [name] ([id number]) -------------------------------------------------------------------------------- Physical Exam Details Patient Name: [name] Date of Service: 05/23/2016 11:15 AM Medical Record Number: [id number] Patient Account Number: [id number] Date of Birth/Sex: 1946-01-12 (70 y.o. Female) Treating RN: Cornell Barman Primary Care Provider: Caryl Bis, ERIC Other Clinician: Referring Provider: Caryl Bis, ERIC Treating Provider/Extender: Frann Rider in Treatment: 20 Constitutional . Pulse regular. Respirations normal and unlabored. Afebrile. . Eyes Nonicteric. Reactive to light. Ears, Nose, Mouth, and Throat Lips, teeth, and gums WNL.Marland Kitchen Moist mucosa without lesions. Neck supple and nontender. No palpable supraclavicular or cervical adenopathy. Normal sized without goiter. Respiratory WNL. No retractions.. Breath sounds WNL, No rubs, rales, rhonchi, or wheeze.. Cardiovascular Heart rhythm and rate regular, no murmur or gallop.. Pedal Pulses WNL. No clubbing, cyanosis or edema. Chest Breasts symmetical and no nipple  discharge.. Breast tissue WNL, no masses, lumps, or tenderness.. Lymphatic No adneopathy. No adenopathy. No adenopathy. Musculoskeletal Adexa without tenderness or enlargement.. Digits and nails w/o clubbing, cyanosis, infection, petechiae, ischemia, or inflammatory conditions.. Integumentary (Hair, Skin) No suspicious lesions. No crepitus or fluctuance. No peri-wound warmth or erythema. No masses.Marland Kitchen Psychiatric Judgement and insight Intact.. No evidence of depression, anxiety, or agitation.. Notes wound was flushed out nicely with saline and then to the Q-tip and and Endoform was reapplied Electronic Signature(s) Signed: 05/23/2016 11:34:00 AM By: Christin Fudge MD, FACS Entered By: Christin Fudge on 05/23/2016 11:33:59 [name] ([id number]) -------------------------------------------------------------------------------- Physician Orders Details Patient Name: [name] Date of Service: 05/23/2016 11:15 AM Medical Record Number: [id number] Patient Account Number: [id number] Date of Birth/Sex: Nov 25, 1945 (70 y.o. Female) Treating RN: Cornell Barman Primary Care Provider: Caryl Bis, ERIC Other Clinician: Referring Provider: Caryl Bis, ERIC Treating Provider/Extender: Frann Rider in Treatment: 20 Verbal / Phone Orders: Yes Clinician: Cornell Barman Read Back and Verified: Yes Diagnosis Coding ICD-10 Coding Code Description E10.621 Type 1 diabetes mellitus with foot ulcer L89.623 Pressure ulcer of left heel, stage 3 Z92.21 Personal history of antineoplastic chemotherapy Z79.52 Long term (current) use of systemic steroids Wound Cleansing Wound #2 Left Calcaneus o Clean wound with Normal Saline. Primary Wound Dressing Wound #2 Left Calcaneus o Other: - Endoform Secondary Dressing Wound #2 Left Calcaneus o Dry Gauze - 2x2 o Boardered Foam Dressing Dressing Change Frequency Wound #2 Left Calcaneus o Change dressing every week - can change it if it gets  wet and /or comes off. Cut another strip and insert Follow-up Appointments Wound #2 Left Calcaneus o Return Appointment in 1 week. Edema Control Wound #2 Left Calcaneus o Patient to wear own compression stockings - Patient instructed to wear 20-30 mmHg gradient o Elevate legs to the level of the heart and pump ankles as often as possible o Support Garment 20-30 mm/Hg pressure to: [name], [name] ([id number]) Additional Orders / Instructions Wound #2 Left Calcaneus o Activity as tolerated Electronic  Signature(s) Signed: 05/23/2016 4:24:55 PM By: Christin Fudge MD, FACS Signed: 05/23/2016 5:03:06 PM By: Gretta Cool RN, BSN, Kim RN, BSN Entered By: Gretta Cool, RN, BSN, Kim on 05/23/2016 11:35:19 [name], [name] ([id number]) -------------------------------------------------------------------------------- Problem List Details Patient Name: [name] Date of Service: 05/23/2016 11:15 AM Medical Record Number: [id number] Patient Account Number: [id number] Date of Birth/Sex: July 05, 1945 (70 y.o. Female) Treating RN: Cornell Barman Primary Care Provider: Caryl Bis, ERIC Other Clinician: Referring Provider: Caryl Bis, ERIC Treating Provider/Extender: Frann Rider in Treatment: 20 Active Problems ICD-10 Encounter Code Description Active Date Diagnosis E10.621 Type 1 diabetes mellitus with foot ulcer 12/31/2015 Yes L89.623 Pressure ulcer of left heel, stage 3 12/31/2015 Yes Z92.21 Personal history of antineoplastic chemotherapy 12/31/2015 Yes Z79.52 Long term (current) use of systemic steroids 12/31/2015 Yes Inactive Problems Resolved Problems Electronic Signature(s) Signed: 05/23/2016 11:33:17 AM By: Christin Fudge MD, FACS Entered By: Christin Fudge on 05/23/2016 11:33:16 [name] ([id number]) -------------------------------------------------------------------------------- Progress Note Details Patient Name: [name] Date of Service: 05/23/2016 11:15  AM Medical Record Number: [id number] Patient Account Number: [id number] Date of Birth/Sex: 12-Dec-1945 (70 y.o. Female) Treating RN: Cornell Barman Primary Care Provider: Caryl Bis, ERIC Other Clinician: Referring Provider: Caryl Bis, ERIC Treating Provider/Extender: Frann Rider in Treatment: 20 Subjective Chief Complaint Information obtained from Patient Mrs. Beil presents for evaluation of her left posterior heel pressure ulcer History of Present Illness (HPI) The following HPI elements were documented for the patient's wound: Location: right heel Quality: Patient reports experiencing a dull pain to affected area(s). Severity: Patient states wound are getting worse. Duration: Patient has had the wound for Timing: Pain in wound is Intermittent (comes and goes Context: The wound appeared gradually over time Modifying Factors: Consults to this date include: local care was done as per the medication given by the PCP which may have been medihoney Associated Signs and Symptoms: Patient reports having difficulty standing for long periods. 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 In the past she was seen for a decubitus ulcer on her right gluteal area. She is known to have diabetes mellitus type 1 and her most recent A1c was 7.2% Past medical history significant for leukopenia, cholelithiasis, hypertension, chronic diastolic CHF, coronary artery disease,type 1 diabetes mellitus, rheumatoid arthritis, collagen vascular disease, ovarian cancer, status post CABG in 2014 and status post Port-A-Cath insertion by Dr. Leotis Pain in October 2016. She has quit smoking about 20 years ago. She was advised to use Medihoney with calcium alginate pads to be applied over the wound. 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 01/07/2016 -- x-ray of the left heel done -- IMPRESSION: No radiographic evidence of osteomyelitis. If this remains a clinical concern, recommend MR imaging. 02/05/16: returns today for f/u. denies fever, chills, body aches or malaise. no interval changes regarding health status. 02/12/16: pt is doing well. wound has improved. no s/s of infection. no systemic s/s of infection. She reports will be on vacation next week, and requests appointment in 2 weeks. 03/18/2016 - is going to be starting her  chemotherapy this coming Monday and will have cycles every 3 [name], [name]. ([id number]) weeks 04-01-16 [name], accompanied by her husband, presents for evaluation of her left posterior heel stage III pressure ulcer. She states that she started chemotherapy last week. Her chemotherapy is on a cycle of one week on 3 weeks off, with her next treatment scheduled for December 11. There has been an increase in drainage since her last appointment. She denies any pain. They have been using PolyMem silver rope. 04/22/16 - [name] presents today, accompanied by her husband, for a violation of her left posterior low heel pressure ulcer. She continues to receive chemotherapy, with the exception of last week due to neutropenia. Her next scheduled chemotherapy is on January 2. She states that her chemotherapy schedule is 1 day on 8 days off. She and her husband deny any overt changes to her wound, denies pain, denies any increased drainage. 04/29/2016 -- the copayment for the skin substitute is about $300 a week and they have declined use of this material. Objective Constitutional Pulse regular.  Respirations normal and unlabored. Afebrile. Vitals Time Taken: 11:14 AM, Height: 63 in, Weight: 130 lbs, BMI: 23, Temperature: 98.2 F, Pulse: 72 bpm, Respiratory Rate: 16 breaths/min, Blood Pressure: 108/34 mmHg. General Notes: Patient states the low diastolic is normal for her. MD notified. Eyes Nonicteric. Reactive to light. Ears, Nose, Mouth, and Throat Lips, teeth, and gums WNL.Marland Kitchen Moist mucosa without lesions. Neck supple and nontender. No palpable supraclavicular or cervical adenopathy. Normal sized without goiter. Respiratory WNL. No retractions.. Breath sounds WNL, No rubs, rales, rhonchi, or wheeze.. Cardiovascular Heart rhythm and rate regular, no murmur or gallop.. Pedal Pulses WNL. No clubbing, cyanosis or edema. Chest Breasts symmetical and no nipple discharge.. Breast tissue WNL, no masses, lumps, or tenderness.. Lymphatic No adneopathy. No adenopathy. No adenopathy. [name], [name] ([id number]) Musculoskeletal Adexa without tenderness or enlargement.. Digits and nails w/o clubbing, cyanosis, infection, petechiae, ischemia, or inflammatory conditions.Marland Kitchen Psychiatric Judgement and insight Intact.. No evidence of depression, anxiety, or agitation.. General Notes: wound was flushed out nicely with saline and then to the Q-tip and and Endoform was reapplied Integumentary (Hair, Skin) No suspicious lesions. No crepitus or fluctuance. No peri-wound warmth or erythema. No masses.. Wound #2 status is Open. Original cause of wound was Pressure Injury. The wound is located on the Left Calcaneus. The wound measures 0.3cm length x 0.4cm width x 0.2cm depth; 0.094cm^2 area and 0.019cm^3 volume. There is Fat Layer (Subcutaneous Tissue) Exposed exposed. There is no tunneling or undermining noted. There is a small amount of sanguinous drainage noted. The wound margin is flat and intact. There is medium (34-66%) pale granulation within the wound bed. There is a small (1-33%) amount of  necrotic tissue within the wound bed including Adherent Slough. The periwound skin appearance exhibited: Callus. The periwound skin appearance did not exhibit: Crepitus, Excoriation, Induration, Rash, Scarring, Dry/Scaly, Maceration, Atrophie Blanche, Cyanosis, Ecchymosis, Hemosiderin Staining, Mottled, Pallor, Rubor, Erythema. Periwound temperature was noted as No Abnormality. The periwound has tenderness on palpation. Assessment Active Problems ICD-10 E10.621 - Type 1 diabetes mellitus with foot ulcer L89.623 - Pressure ulcer of left heel, stage 3 Z92.21 - Personal history of antineoplastic chemotherapy Z79.52 - Long term (current) use of systemic steroids Plan Wound Cleansing: Wound #2 Left Calcaneus: Clean wound with Normal Saline. Primary Wound Dressing: [name], [name] ([id number]) Wound #2 Left Calcaneus: Other: - Endoform Secondary Dressing: Wound #2 Left Calcaneus: Dry Gauze - 2x2 Boardered Foam Dressing Dressing  Change Frequency: Wound #2 Left Calcaneus: Change dressing every week - can change it if it gets wet and /or comes off. Cut another strip and insert Follow-up Appointments: Wound #2 Left Calcaneus: Return Appointment in 1 week. Edema Control: Wound #2 Left Calcaneus: Patient to wear own compression stockings - Patient instructed to wear 20-30 mmHg gradient Elevate legs to the level of the heart and pump ankles as often as possible Support Garment 20-30 mm/Hg pressure to: Additional Orders / Instructions: Wound #2 Left Calcaneus: Activity as tolerated I have packed Endoform into the wound and applied an appropriate dressing over this. She will change it if required and see me back next week. She has a bit of lower extremity lymphedema and I was asked her to use compression stockings and to be careful about offloading. Nutrition, vitamins have also been discussed with her. She is still taking her chemotherapy currently. Electronic Signature(s) Signed:  05/23/2016 4:28:32 PM By: Christin Fudge MD, FACS Previous Signature: 05/23/2016 11:35:19 AM Version By: Christin Fudge MD, FACS Entered By: Christin Fudge on 05/23/2016 16:28:32 [name], [name] ([id number]) -------------------------------------------------------------------------------- SuperBill Details Patient Name: [name] Date of Service: 05/23/2016 Medical Record Number: [id number] Patient Account Number: [id number] Date of Birth/Sex: 14-Aug-1945 (70 y.o. Female) Treating RN: Cornell Barman Primary Care Provider: Caryl Bis, ERIC Other Clinician: Referring Provider: Caryl Bis, ERIC Treating Provider/Extender: Frann Rider in Treatment: 20 Diagnosis Coding ICD-10 Codes Code Description E10.621 Type 1 diabetes mellitus with foot ulcer L89.623 Pressure ulcer of left heel, stage 3 Z92.21 Personal history of antineoplastic chemotherapy Z79.52 Long term (current) use of systemic steroids Facility Procedures CPT4 Code: [id number] Description: 99213 - WOUND CARE VISIT-LEV 3 EST PT Modifier: Quantity: 1 Physician Procedures CPT4 Code: [id number] Description: [id number] - WC PHYS LEVEL 3 - EST PT ICD-10 Description Diagnosis E10.621 Type 1 diabetes mellitus with foot ulcer L89.623 Pressure ulcer of left heel, stage 3 Z92.21 Personal history of antineoplastic chemother Z79.52 Long term (current) use of  systemic steroids Modifier: apy Quantity: 1 Electronic Signature(s) Signed: 05/23/2016 5:03:06 PM By: Gretta Cool, RN, BSN, Kim RN, BSN Previous Signature: 05/23/2016 11:35:49 AM Version By: Christin Fudge MD, FACS Entered By: Gretta Cool, RN, BSN, Kim on 05/23/2016 17:02:18

## 2016-05-24 ENCOUNTER — Inpatient Hospital Stay (HOSPITAL_BASED_OUTPATIENT_CLINIC_OR_DEPARTMENT_OTHER): Payer: Medicare Other | Admitting: Hematology and Oncology

## 2016-05-24 ENCOUNTER — Encounter: Payer: Self-pay | Admitting: Hematology and Oncology

## 2016-05-24 ENCOUNTER — Inpatient Hospital Stay: Payer: Medicare Other

## 2016-05-24 ENCOUNTER — Other Ambulatory Visit: Payer: Self-pay | Admitting: Hematology and Oncology

## 2016-05-24 VITALS — Temp 96.9°F

## 2016-05-24 VITALS — BP 102/65 | HR 77 | Resp 18 | Wt 139.1 lb

## 2016-05-24 DIAGNOSIS — E10621 Type 1 diabetes mellitus with foot ulcer: Secondary | ICD-10-CM | POA: Diagnosis not present

## 2016-05-24 DIAGNOSIS — D649 Anemia, unspecified: Secondary | ICD-10-CM

## 2016-05-24 DIAGNOSIS — Z862 Personal history of diseases of the blood and blood-forming organs and certain disorders involving the immune mechanism: Secondary | ICD-10-CM

## 2016-05-24 DIAGNOSIS — Z7901 Long term (current) use of anticoagulants: Secondary | ICD-10-CM

## 2016-05-24 DIAGNOSIS — Z8619 Personal history of other infectious and parasitic diseases: Secondary | ICD-10-CM

## 2016-05-24 DIAGNOSIS — Z808 Family history of malignant neoplasm of other organs or systems: Secondary | ICD-10-CM

## 2016-05-24 DIAGNOSIS — D709 Neutropenia, unspecified: Secondary | ICD-10-CM

## 2016-05-24 DIAGNOSIS — C786 Secondary malignant neoplasm of retroperitoneum and peritoneum: Secondary | ICD-10-CM

## 2016-05-24 DIAGNOSIS — C801 Malignant (primary) neoplasm, unspecified: Secondary | ICD-10-CM

## 2016-05-24 DIAGNOSIS — R0602 Shortness of breath: Secondary | ICD-10-CM

## 2016-05-24 DIAGNOSIS — E785 Hyperlipidemia, unspecified: Secondary | ICD-10-CM

## 2016-05-24 DIAGNOSIS — K222 Esophageal obstruction: Secondary | ICD-10-CM

## 2016-05-24 DIAGNOSIS — Z7689 Persons encountering health services in other specified circumstances: Secondary | ICD-10-CM

## 2016-05-24 DIAGNOSIS — C561 Malignant neoplasm of right ovary: Secondary | ICD-10-CM

## 2016-05-24 DIAGNOSIS — I998 Other disorder of circulatory system: Secondary | ICD-10-CM

## 2016-05-24 DIAGNOSIS — I10 Essential (primary) hypertension: Secondary | ICD-10-CM

## 2016-05-24 DIAGNOSIS — C569 Malignant neoplasm of unspecified ovary: Secondary | ICD-10-CM | POA: Diagnosis not present

## 2016-05-24 DIAGNOSIS — M898X9 Other specified disorders of bone, unspecified site: Secondary | ICD-10-CM

## 2016-05-24 DIAGNOSIS — E108 Type 1 diabetes mellitus with unspecified complications: Secondary | ICD-10-CM

## 2016-05-24 DIAGNOSIS — K449 Diaphragmatic hernia without obstruction or gangrene: Secondary | ICD-10-CM

## 2016-05-24 DIAGNOSIS — I5032 Chronic diastolic (congestive) heart failure: Secondary | ICD-10-CM

## 2016-05-24 DIAGNOSIS — Z86718 Personal history of other venous thrombosis and embolism: Secondary | ICD-10-CM

## 2016-05-24 DIAGNOSIS — Z90710 Acquired absence of both cervix and uterus: Secondary | ICD-10-CM

## 2016-05-24 DIAGNOSIS — I82C11 Acute embolism and thrombosis of right internal jugular vein: Secondary | ICD-10-CM

## 2016-05-24 DIAGNOSIS — Z79899 Other long term (current) drug therapy: Secondary | ICD-10-CM

## 2016-05-24 DIAGNOSIS — M069 Rheumatoid arthritis, unspecified: Secondary | ICD-10-CM

## 2016-05-24 DIAGNOSIS — I252 Old myocardial infarction: Secondary | ICD-10-CM

## 2016-05-24 DIAGNOSIS — Z9641 Presence of insulin pump (external) (internal): Secondary | ICD-10-CM

## 2016-05-24 DIAGNOSIS — Z8701 Personal history of pneumonia (recurrent): Secondary | ICD-10-CM

## 2016-05-24 DIAGNOSIS — I2510 Atherosclerotic heart disease of native coronary artery without angina pectoris: Secondary | ICD-10-CM

## 2016-05-24 DIAGNOSIS — R918 Other nonspecific abnormal finding of lung field: Secondary | ICD-10-CM

## 2016-05-24 DIAGNOSIS — Z933 Colostomy status: Secondary | ICD-10-CM

## 2016-05-24 DIAGNOSIS — N1330 Unspecified hydronephrosis: Secondary | ICD-10-CM

## 2016-05-24 DIAGNOSIS — D72819 Decreased white blood cell count, unspecified: Secondary | ICD-10-CM

## 2016-05-24 DIAGNOSIS — Z794 Long term (current) use of insulin: Secondary | ICD-10-CM

## 2016-05-24 DIAGNOSIS — Z9049 Acquired absence of other specified parts of digestive tract: Secondary | ICD-10-CM

## 2016-05-24 DIAGNOSIS — M2550 Pain in unspecified joint: Secondary | ICD-10-CM

## 2016-05-24 DIAGNOSIS — C787 Secondary malignant neoplasm of liver and intrahepatic bile duct: Secondary | ICD-10-CM

## 2016-05-24 DIAGNOSIS — G629 Polyneuropathy, unspecified: Secondary | ICD-10-CM

## 2016-05-24 DIAGNOSIS — K219 Gastro-esophageal reflux disease without esophagitis: Secondary | ICD-10-CM

## 2016-05-24 DIAGNOSIS — Z90722 Acquired absence of ovaries, bilateral: Secondary | ICD-10-CM

## 2016-05-24 DIAGNOSIS — R188 Other ascites: Secondary | ICD-10-CM

## 2016-05-24 DIAGNOSIS — Z87891 Personal history of nicotine dependence: Secondary | ICD-10-CM

## 2016-05-24 LAB — COMPREHENSIVE METABOLIC PANEL
ALT: 18 U/L (ref 14–54)
AST: 26 U/L (ref 15–41)
Albumin: 3.2 g/dL — ABNORMAL LOW (ref 3.5–5.0)
Alkaline Phosphatase: 71 U/L (ref 38–126)
Anion gap: 6 (ref 5–15)
BUN: 22 mg/dL — ABNORMAL HIGH (ref 6–20)
CO2: 25 mmol/L (ref 22–32)
Calcium: 8.8 mg/dL — ABNORMAL LOW (ref 8.9–10.3)
Chloride: 104 mmol/L (ref 101–111)
Creatinine, Ser: 0.72 mg/dL (ref 0.44–1.00)
GFR calc Af Amer: >60 mL/min (ref >60)
GFR calc non Af Amer: >60 mL/min (ref >60)
Glucose, Bld: 183 mg/dL — ABNORMAL HIGH (ref 65–99)
Potassium: 4.2 mmol/L (ref 3.5–5.1)
Sodium: 135 mmol/L (ref 135–145)
Total Bilirubin: 0.3 mg/dL (ref 0.3–1.2)
Total Protein: 6.8 g/dL (ref 6.5–8.1)

## 2016-05-24 LAB — CBC WITH DIFFERENTIAL/PLATELET
Basophils Absolute: 0.1 10*3/uL (ref 0–0.1)
Basophils Relative: 2 %
Eosinophils Absolute: 0 10*3/uL (ref 0–0.7)
Eosinophils Relative: 1 %
HCT: 24.2 % — ABNORMAL LOW (ref 35.0–47.0)
Hemoglobin: 8.4 g/dL — ABNORMAL LOW (ref 12.0–16.0)
Lymphocytes Relative: 27 %
Lymphs Abs: 0.7 10*3/uL — ABNORMAL LOW (ref 1.0–3.6)
MCH: 31.7 pg (ref 26.0–34.0)
MCHC: 34.7 g/dL (ref 32.0–36.0)
MCV: 91.5 fL (ref 80.0–100.0)
Monocytes Absolute: 0.5 10*3/uL (ref 0.2–0.9)
Monocytes Relative: 20 %
Neutro Abs: 1.3 10*3/uL — ABNORMAL LOW (ref 1.4–6.5)
Neutrophils Relative %: 50 %
Platelets: 324 10*3/uL (ref 150–440)
RBC: 2.64 MIL/uL — ABNORMAL LOW (ref 3.80–5.20)
RDW: 19.3 % — ABNORMAL HIGH (ref 11.5–14.5)
WBC: 2.6 10*3/uL — ABNORMAL LOW (ref 3.6–11.0)

## 2016-05-24 LAB — IRON AND TIBC
Iron: 41 ug/dL (ref 28–170)
Saturation Ratios: 14 % (ref 10.4–31.8)
TIBC: 294 ug/dL (ref 250–450)
UIBC: 253 ug/dL

## 2016-05-24 LAB — SEDIMENTATION RATE: Sed Rate: 126 mm/hr — ABNORMAL HIGH (ref 0–30)

## 2016-05-24 LAB — FERRITIN: Ferritin: 130 ng/mL (ref 11–307)

## 2016-05-24 LAB — MAGNESIUM: Magnesium: 1.7 mg/dL (ref 1.7–2.4)

## 2016-05-24 LAB — TSH: TSH: 2.407 u[IU]/mL (ref 0.350–4.500)

## 2016-05-24 LAB — FOLATE: Folate: 60.1 ng/mL (ref >5.9)

## 2016-05-24 LAB — VITAMIN B12: Vitamin B-12: 2123 pg/mL — ABNORMAL HIGH (ref 180–914)

## 2016-05-24 MED ORDER — DEXAMETHASONE SODIUM PHOSPHATE 10 MG/ML IJ SOLN
10.0000 mg | Freq: Once | INTRAMUSCULAR | Status: AC
  Administered 2016-05-24: 10 mg via INTRAVENOUS
  Filled 2016-05-24: qty 1

## 2016-05-24 MED ORDER — SODIUM CHLORIDE 0.9% FLUSH
10.0000 mL | INTRAVENOUS | Status: DC | PRN
  Administered 2016-05-24: 10 mL via INTRAVENOUS
  Filled 2016-05-24: qty 10

## 2016-05-24 MED ORDER — SODIUM CHLORIDE 0.9 % IV SOLN
Freq: Once | INTRAVENOUS | Status: AC
  Administered 2016-05-24: 11:00:00 via INTRAVENOUS
  Filled 2016-05-24: qty 1000

## 2016-05-24 MED ORDER — PALONOSETRON HCL INJECTION 0.25 MG/5ML
0.2500 mg | Freq: Once | INTRAVENOUS | Status: AC
  Administered 2016-05-24: 0.25 mg via INTRAVENOUS
  Filled 2016-05-24: qty 5

## 2016-05-24 MED ORDER — HEPARIN SOD (PORK) LOCK FLUSH 100 UNIT/ML IV SOLN
500.0000 [IU] | Freq: Once | INTRAVENOUS | Status: AC
  Administered 2016-05-24: 500 [IU] via INTRAVENOUS
  Filled 2016-05-24: qty 5

## 2016-05-24 MED ORDER — SODIUM CHLORIDE 0.9 % IV SOLN
1300.0000 mg | Freq: Once | INTRAVENOUS | Status: AC
  Administered 2016-05-24: 1300 mg via INTRAVENOUS
  Filled 2016-05-24: qty 23.67

## 2016-05-24 MED ORDER — SODIUM CHLORIDE 0.9 % IV SOLN
355.5000 mg | Freq: Once | INTRAVENOUS | Status: AC
  Administered 2016-05-24: 360 mg via INTRAVENOUS
  Filled 2016-05-24: qty 36

## 2016-05-24 NOTE — Progress Notes (Signed)
Patient offers no concerns today. 

## 2016-05-24 NOTE — Progress Notes (Signed)
[facility] day:  05/24/2016   Chief Complaint: [name] is a 71 y.o. female with stage IV ovarian cancer who is seen for assessment prior to cycle #4 carboplatin and gemcitabine.  HPI:  The patient was last seen in the medical oncology clinic on 05/10/2016.  At that time, she felt good.  She received day 8 of cycle #3 carboplatin and gemcitabine.  She received GCSF on 01/10, 01/11, 01/12, and 05/16/2016.  CBC on 05/16/2016 revealed a hematocrit of 27.2, hemoglobin 9.2, platelets 66,000, WBC 2400 and ANC 1200.  During the interim, she has done well.  She notes that today she feels fine.  Recently her diastolic blood pressure has been low.  She has had no fevers.  Her heel is "about healed up".   Past Medical History:  Diagnosis Date  . CAD (coronary artery disease)    a. 09/2012 Cath: LM nl, LAD 95p, 82m LCX 922mOM2 50, RCA 100.  . Marland Kitchenholelithiasis   . Chronic diastolic CHF (congestive heart failure) ([facility] [place]  . Collagen vascular disease ([facility] [place]  . Esophageal stricture   . Exertional shortness of breath   . GERD (gastroesophageal reflux disease)   . H/O hiatal hernia   . Herniated disc   . History of pancytopenia   . Hyperlipidemia   . Hypertension   . Hypokalemia   . Leukopenia 2012   s/p bone marrow biopsy, Dr. PaMa Hillock. NSTEMI (non-ST elevated myocardial infarction) ([facility]/2014   "mild" (10/18/2012)  . Ovarian cancer ([facility]   chemo  . Pneumonia 2013; 08/2012   "one lung; double" (10/18/2012)  . Rheumatoid arthritis(714.0)   . Type I diabetes mellitus ([facility]   "dx'd in 1957" (10/18/2012)    Past Surgical History:  Procedure Laterality Date  . ABDOMINAL HYSTERECTOMY  06/15/2015   Dx L/S, EXLAP TAH BSO omentectomy RSRx colostomy diaphragm resection stripping  . CARDIAC CATHETERIZATION  10/18/2012   "first one was today" (10/18/2012)  . CATARACT EXTRACTION W/ INTRAOCULAR LENS IMPLANT Right 2010  . CHOLECYSTECTOMY  06/15/2015    combined case with ovarian cancer debulking  . COLON SURGERY    . CORONARY ARTERY BYPASS GRAFT N/A 10/19/2012   Procedure: CORONARY ARTERY BYPASS GRAFTING (CABG);  Surgeon: StMelrose NakayamaMD;  Location: [facility] Service: Open Heart Surgery;  Laterality: N/A;  . ESOPHAGEAL DILATION     "3 or 4 times" (10/19/2011)  . ESOPHAGOGASTRODUODENOSCOPY  2012   Dr. IfMinna Merritts. OSTOMY    . OVARY SURGERY     removal  . PERIPHERAL VASCULAR CATHETERIZATION N/A 03/02/2015   Procedure: PoGlori Luisath Insertion;  Surgeon: JaAlgernon HuxleyMD;  [facility];  Service: Cardiovascular;  Laterality: N/A;  . TUBAL LIGATION  1970    Family History  Problem Relation Age of Onset  . Diabetes Mother   . Arthritis Mother   . Diabetes Father   . Arthritis Father   . Bone cancer Sister     Social History:  reports that she quit smoking about 21 years ago. Her smoking use included Cigarettes. She has a 30.00 pack-year smoking history. She has never used smokeless tobacco. She reports that she does not drink alcohol or use drugs.  She is accompanied by her husband today.  Allergies:  Allergies  Allergen Reactions  . Codeine Nausea And Vomiting  . Latex Rash    Current Medications: Current Outpatient Prescriptions  Medication Sig  Dispense Refill  . Calcium-Vitamin D 600-200 MG-UNIT tablet Take 2 tablets by mouth daily.     Marland Kitchen ELIQUIS 5 MG TABS tablet TAKE 1 TABLET BY MOUTH TWICE A DAY 60 tablet 0  . folic acid (FOLVITE) 1 MG tablet TAKE 1 TABLET BY MOUTH ONCE A DAY 90 tablet 3  . furosemide (LASIX) 80 MG tablet Take 80 mg by mouth daily as needed.     . Insulin Human (INSULIN PUMP) SOLN Pt uses Humalog insulin.    Marland Kitchen insulin lispro (HUMALOG) 100 UNIT/ML injection Inject 0.06 mLs (6 Units total) into the skin daily. 30 mL 3  . lidocaine-prilocaine (EMLA) cream Apply 1 application topically as needed (prior to accessing port).    . loratadine (CLARITIN) 10 MG tablet Take 1 tablet by mouth daily as  needed.    Marland Kitchen losartan (COZAAR) 100 MG tablet Take 1 tablet (100 mg total) by mouth daily. 90 tablet 1  . magnesium oxide (MAG-OX) 400 (241.3 Mg) MG tablet Take 1 tablet (400 mg total) by mouth daily. 30 tablet 2  . metoprolol tartrate (LOPRESSOR) 25 MG tablet Take 1 tablet (25 mg total) by mouth 2 (two) times daily. 180 tablet 3  . ondansetron (ZOFRAN) 8 MG tablet TAKE 1 TABLET BY MOUTH EVERY 8 HOURS AS NEEDED FOR NAUSEA OR VOMITING 30 tablet 1  . ONE TOUCH ULTRA TEST test strip     . oxyCODONE (OXY IR/ROXICODONE) 5 MG immediate release tablet Take 5 mg by mouth every 6 (six) hours as needed. Reported on 11/17/2015    . pantoprazole (PROTONIX) 40 MG tablet TAKE 1 TABLET BY MOUTH TWICE A DAY 180 tablet 1  . potassium chloride SA (K-DUR,KLOR-CON) 20 MEQ tablet TAKE 2 TABLETS BY MOUTH ONCE A DAY 60 tablet 1  . potassium chloride SA (K-DUR,KLOR-CON) 20 MEQ tablet Take 1 tablet (20 mEq total) by mouth daily. 20 tablet 0  . ranitidine (ZANTAC) 150 MG tablet Take 1 tablet (150 mg total) by mouth 2 (two) times daily. 180 tablet 1  . vitamin C (ASCORBIC ACID) 500 MG tablet Take 500 mg by mouth daily.    Marland Kitchen zinc gluconate 50 MG tablet Take 50 mg by mouth daily.     No current facility-administered medications for this visit.    Facility-Administered Medications Ordered in Other Visits  Medication Dose Route Frequency Provider Last Rate Last Dose  . heparin lock flush 100 unit/mL  500 Units Intravenous Once Lequita Asal, MD      . sodium chloride 0.9 % injection 10 mL  10 mL Intracatheter PRN Lequita Asal, MD      . sodium chloride 0.9 % injection 10 mL  10 mL Intravenous PRN Lequita Asal, MD   10 mL at 04/13/15 0852  . sodium chloride flush (NS) 0.9 % injection 10 mL  10 mL Intravenous PRN Lequita Asal, MD   10 mL at 05/24/16 0855    Review of Systems:  GENERAL:  Feels good today.  No fever, chills or sweats.  Weight up 2 pounds. PERFORMANCE STATUS (ECOG):  1 HEENT:  No visual  changes, sore throat, mouth sores or tenderness. Lungs:  No shortness of breath or cough.  No hemoptysis. Cardiac:  No chest pain, palpitations, orthopnea, or PND. GI:  No abdominal pain or distention.  No nausea, vomiting, diarrhea, melena or hematochezia. GU:  No urgency, frequency, dysuria, or hematuria. Musculoskeletal:  Severe rheumatoid arthritis (off MTX and on prednisone).  Osteoporosis.  No back  pain. No muscle tenderness. Extremities:  No pain or swelling. Skin:  No increased bruising or bleeding on Eliquis.  No rashes or skin changes. Neuro:  Neuropathy (stable).  No headache, numbness or weakness, balance or coordination issues. Endocrine:  Diabetes on an insulin pump.  Blood sugar good.  No thyroid issues, hot flashes or night sweats. Psych:  No mood changes, depression or anxiety. Pain: No pain. Review of systems:  All other systems reviewed and found to be negative.  Physical Exam: Blood pressure 102/65, pulse 77, resp. rate 18, weight 139 lb 1.8 oz (63.1 kg). GENERAL:  Well developed, well nourished woman sitting comfortably in the exam room in no acute distress. MENTAL STATUS:  Alert and oriented to person, place and time. HEAD:  Curly gray hair.  Normocephalic, atraumatic, face symmetric, no Cushingoid features. EYES:  Gold rimmed glasses.  Blue eyes.  No conjunctivitis or scleral icterus.  ENT: Oropharynx clear without lesion. Tongue normal. Mucous membranes moist.  RESPIRATORY: Clear to auscultation without rales, wheezes or rhonchi. CARDIOVASCULAR: Regular rate and rhythm without murmur, rub or gallop. ABDOMEN: Soft, non-tender with active bowel sounds and no hepatosplenomegaly. No palpable nodularity or masses.  No shifting dullness.  Insulin pump. SKIN: No rashes, ulcers, or bruises. EXTREMITIES:  No edema, skin discoloration or tenderness. No palpable cords. LYMPH NODES: No palpable cervical, supraclavicular, axillary or inguinal adenopathy  NEUROLOGICAL:  Appropriate. PSYCH: Appropriate.   Infusion on 05/24/2016  Component Date Value Ref Range Status  . WBC 05/24/2016 2.6* 3.6 - 11.0 K/uL Final  . RBC 05/24/2016 2.64* 3.80 - 5.20 MIL/uL Final  . Hemoglobin 05/24/2016 8.4* 12.0 - 16.0 g/dL Final  . HCT 05/24/2016 24.2* 35.0 - 47.0 % Final  . MCV 05/24/2016 91.5  80.0 - 100.0 fL Final  . MCH 05/24/2016 31.7  26.0 - 34.0 pg Final  . MCHC 05/24/2016 34.7  32.0 - 36.0 g/dL Final  . RDW 05/24/2016 19.3* 11.5 - 14.5 % Final  . Platelets 05/24/2016 324  150 - 440 K/uL Final  . Neutrophils Relative % 05/24/2016 50  % Final  . Neutro Abs 05/24/2016 1.3* 1.4 - 6.5 K/uL Final  . Lymphocytes Relative 05/24/2016 27  % Final  . Lymphs Abs 05/24/2016 0.7* 1.0 - 3.6 K/uL Final  . Monocytes Relative 05/24/2016 20  % Final  . Monocytes Absolute 05/24/2016 0.5  0.2 - 0.9 K/uL Final  . Eosinophils Relative 05/24/2016 1  % Final  . Eosinophils Absolute 05/24/2016 0.0  0 - 0.7 K/uL Final  . Basophils Relative 05/24/2016 2  % Final  . Basophils Absolute 05/24/2016 0.1  0 - 0.1 K/uL Final  . Sodium 05/24/2016 135  135 - 145 mmol/L Final  . Potassium 05/24/2016 4.2  3.5 - 5.1 mmol/L Final  . Chloride 05/24/2016 104  101 - 111 mmol/L Final  . CO2 05/24/2016 25  22 - 32 mmol/L Final  . Glucose, Bld 05/24/2016 183* 65 - 99 mg/dL Final  . BUN 05/24/2016 22* 6 - 20 mg/dL Final  . Creatinine, Ser 05/24/2016 0.72  0.44 - 1.00 mg/dL Final  . Calcium 05/24/2016 8.8* 8.9 - 10.3 mg/dL Final  . Total Protein 05/24/2016 6.8  6.5 - 8.1 g/dL Final  . Albumin 05/24/2016 3.2* 3.5 - 5.0 g/dL Final  . AST 05/24/2016 26  15 - 41 U/L Final  . ALT 05/24/2016 18  14 - 54 U/L Final  . Alkaline Phosphatase 05/24/2016 71  38 - 126 U/L Final  . Total Bilirubin 05/24/2016  0.3  0.3 - 1.2 mg/dL Final  . GFR calc non Af Amer 05/24/2016 >60  >60 mL/min Final  . GFR calc Af Amer 05/24/2016 >60  >60 mL/min Final   Comment: (NOTE) The eGFR has been calculated using the CKD EPI  equation. This calculation has not been validated in all clinical situations. eGFR's persistently <60 mL/min signify possible Chronic Kidney Disease.   . Anion gap 05/24/2016 6  5 - 15 Final  . Magnesium 05/24/2016 1.7  1.7 - 2.4 mg/dL Final    Assessment:  TRENITY PHA is a 71 y.o. female with stage IV ovarian cancer.  She presented with abdominal discomfort and bloating.  Omental biopsy on 02/23/2015 revealed metastatic high grade serous carcinoma, consistent with gynecologic origin.   She was initially diagnosed with clinical stage IIIC (T3cN1Mx).  CA125 was 707 on 02/17/2015.  Abdomen and pelvic CT scan on 02/13/2015 revealed bilateral mass-like adnexal regions (right adnexal mass 5.6 x 5.0 cm and the left adnexa mass 10.3 x 6.0 x 9.9 cm).  There was a large amount of soft tissue throughout the peritoneal cavity involving the omentum and other peritoneal surfaces.  There was a small volume ascites. There was a peripheral 3.3 x 1.9 cm low-attenuation lesion overlying the right lobe of the liver , likely representing a serosal implant. There was 1.4 x 2.2 cm ill-defined peripheral lesion within the inferior aspect of segment 6 adjacent to the ampulla in the duodenum.   She received 4 cycles of neoadjuvant carboplatin and Taxol (03/05/2015 - 05/22/2015).  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  She had loose stools on oral magnesium.  She was initially on Neurontin then switched Lyrica with cycle #3.  Cycle #4 was notable for neutropenia (ANC 300) requiring GCSF x 3 days.    CA125 was 802.9 on 03/30/2015, 567.9 on 04/13/2015, 168.8 on 05/15/2015, 85.2 on 07/17/2015, 68.6 on 07/28/2015, 34.5 on 09/17/2015, 20.7 on 11/06/2015, 49 on 01/22/2016, 106.1 on 02/22/2016, 138.8 on 03/07/2016, 220.8 on 03/21/2016, 152.8 on 04/11/2016, 125.6 on 04/18/2016, and 76 on 05/03/2016.  Abdominal and pelvic CT scan on 04/28/2015 revealed decreasing bilateral ovarian masses.   The left adnexal mass  measured 4.0 x 6.1 cm (previously 5.0 x 8.5 cm). The right ovary measured 3.8 x 4.9 cm (previously 4.7 x 5.6 cm).  There was improved peritoneal carcinomatosis.  There was a small amount of ascites.  The hepatic dome lesion was stable. The previously seen right hepatic lobe lesion was not well-visualized.  There was a small left pleural effusion and trace right pleural effusion.  The nodular lesion at the ampulla of Vater, extending into the duodenum was stable.  There was no biliary ductal dilatation.  She underwent exploratory laparotomy, lysis of adhesions, total abdominal hysterectomy with bilateral salpingo-oophorectomy, infracolic omentectomy, optimal tumor debulking(< 1 cm), recto-sigmoid resection with creation of end colostomy, cholecystectomy, mobilization of splenic flexure and liver with diaphragmatic stripping on 06/15/2015. The right diaphragm was cleared of tumor. During dissection, the diaphragm was entered and closed with sutures.   She has had a recurrent right sided pleural effusion.  She underwent thoracentesis of 650 cc on post-operative day 3.  She was admitted to [facility] on 07/01/2015 and 07/07/2015 for recurrent shortness of breath.  She has undergone 2 additional thoracentesis (1.1 L on 07/02/2015 and 850 cc on 07/08/2015).  Cytology was negative x 2.  Bilateral lower extremity duplex on 07/03/2015 was negative.  Echo on 07/08/2015 revealed en EF of 55-60%.  She has severe rheumatoid arthritis.  Methotrexate and Enbrel were initially on hold.  She has a normocytic anemia.  Work-up on 02/17/2015 revealed a normal ferritin, B12, folate, TSH.  She denies any melena or hematochezia.    She has anemia likely due to chronic disease. She received 1 unit PRBCs during her admission at [facility]. She denies any melena or hematochezia. She has diabetes and is on an insulin pump.  Rght upper extremity ultrasound on 08/01/2015 revealed a near occlusive thrombus within the central portion of the  right internal jugular vein and central portion of the right subclavian vein. She was on Lovenox 60 mg twice a day.  She switched to Eliquis on 04/16/2016.  She was admitted to [facility] from 07/29/2015 - 08/10/2015 with a right-sided empyema and liver abscess. She underwent CT-guided placement of a liver abscess drain on 07/30/2015. Liver abscess culture grew out group B strep and Enterobacter which was sensitive to Zosyn. She was transitioned to ertapenem Colbert Ewing) prior to discharge.  She was readmitted to [facility] on 09/17/2015.  Chest, abdomen, and pelvic CT scan on 09/01/2015 revealed continued decrease in perihepatic fluid collection contiguous with the right pleural space with percutaneous drain. Drain was removed on 09/01/2015.  Chest, abdomen, and pelvic CT scan on 09/11/2015 revealed a residual versus recurrent fluid collection posterior to the right hepatic lobe (2.6 x 1.1 x 4.0 cm).  Chest, abdomen, and pelvic CT scan on 10/13/2015 revealed resolution of the empyema.    She has had persistent neutropenia felt secondary to her rheumatoid arthritis.  Folate and MMA were normal.  TSH was 6.13 (high) with a free T4 of 1.17 (0.61-1.12).  She began methotrexate (10 mg a week) and prednisone (5 mg a day) for severe rheumatoid arthritis on 12/17/2015.  Bone marrow aspirate and biopsy on 06/09/2010 revealed a hypercellular marrow (70%) with no evidence of dysplasia or malignancy.  Flow cytometry was negative.  Cytogenetics were normal (46,XX).  FISH studies were negative for MDS.  Bone marrow aspirate and biopsy on 12/03/2015 revealed a normocellular to mildy hypercellular marrow for age (40%) with left shifted myelopoiesis, non specific dyserythropoiesis and mild megakaryocytic atypia with no increase in blasts.  There were multiple small nonspecific lymphoid aggregates (favor reactive). There was no increase in reticulin.  There was decreased myeloid cells (37%) with left shifted maturation and 1% atypical  myelod blasts.  There was relatively increased monocytic cells (11%), relatively increased lymphoid cells (36%), and relatively increased eosinophils (6%).  Cytogenetics were normal (66, XX).  SNP microarray was normal.  Alkaline phosphatase was 348 (38-126) on 11/06/2015 and 431 on 11/17/2015.   Fractionated alkaline phosphatase on 11/09/2015 revealed 21% bone and 79% liver.  MRI of the abdomen on 12/14/2015 revealed mild extrahepatic biliary duct dilatation without obstructing lesion identified.  Chest, abdomen, and pelvic CT scan on 02/15/2016 revealed multiple soft tissue nodules throughout the pelvis, small bowel mesenteric and peritoneum and, concerning for peritoneal metastatic disease.  There was soft tissue irregularity within the left upper quadrant.  There was mild right hydronephrosis (etiology unclear).  There was interval resolution of previously described right pleural based fluid and gas collection. There was a pleural based nodule within the right lower hemi-thorax concerning for pleural based metastasis.  There were small bilateral pleural effusions.  There was pulmonary nodularity, predominately within the left upper lobe (metastatic or infectious/inflammatory etiology).  There was slightly increased mediastinal adenopathy (infectious/inflammatory or metastatic).  There was a low attenuation lesion within the left hepatic  lobe (complicated fluid within the fissure or metastatic disease).  She was on tamoxifen from 02/25/2016 - 03/14/2016.  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  Abdomen and pelvic CT scan on 03/08/2016 revealed mild progression of peritoneal disease in the abdomen.  There was interval increase in loculated fluid around the lateral segment left liver, stomach, and spleen.  There was persistent soft tissue lesion at the level of the ampulla with mild intra and extrahepatic biliary duct dilatation.  There was persistent intrahepatic and capsular  metastatic disease involving the liver.  She is currently day 1 of cycle #4 carboplatin and gemcitabine (03/21/2016 - 05/24/2016) with Neulasta support.  Cycle #1 and #2 were truncated secondary to low counts at initiation of treatment and day 8, respectively.  Nadir platelet count was 107,000 on day 11 of cycle #1, 105,000 on day 11 cycle #2, and 66,000 on day #14 of cycle #3.   She received GCSF on day 2, 3, 9, 10, 11, and 14 of cycle #3  Symptomatically, she feels good.  Counts are at patient's baseline (WBC 2600 with ANC 1300).  Platelet count is normal.  Magnesium is normal.  Plan: 1.  Labs today:  CBC with diff, CMP, Mg. 2.  Discuss WBC and ANC today.  Counts are at patient's baseline and similar to day 1 of cycle #1 carboplatin and gemcitabine.  Discuss day 1 chemotherapy (no day 8) with Neulasta tomorrow.  Discuss risk and benefits of treatment.  Patient in agreement with proceeding. 3.  Discuss progressive anemia.  Discuss etiology likely secondary to chemotherapy and rheumatoid arthritis/anemia of chronic disease.  Discuss anemia work-up and preauthorizing for Procrit.  Discuss checking labs in 1 week and beginning Procrit.  Type and hold if needed. 4.  Discuss plans for restaging studies after cycle #4. 5.  Add labs:  ferritin, iron studies, B12, folate, TSH. 6.  Day 1 of cycle #4 gemcitabine. 7.  RTC tomorrow for Neulasta 8.  Preauth Procrit 9.  Continue Eliquis. 10.  RTC in 1 week for MD assessment, labs (CBC with diff, type and hold, BMP, Mg) +/- PRBC tx or Procrit +/- Mg.   Lequita Asal, MD  05/24/2016, 10:08 AM

## 2016-05-24 NOTE — Progress Notes (Signed)
ANC: 1300. MD, Dr. Mike Gip, notified via telephone and already aware. Per MD order: proceed with scheduled treatment today.

## 2016-05-24 NOTE — Progress Notes (Signed)
REYANN, LETTIERE ([id number]) Visit Report for 05/23/2016 Arrival Information Details Patient Name: [name], [name] Date of Service: 05/23/2016 11:15 AM Medical Record Number: [id number] Patient Account Number: [id number] Date of Birth/Sex: [birth year] (71 y.o. Female) Treating RN: Cornell Barman Primary Care Kayah Hecker: Caryl Bis, ERIC Other Clinician: Referring Donda Friedli: Caryl Bis, ERIC Treating Emree Locicero/Extender: Frann Rider in Treatment: 20 Visit Information History Since Last Visit Added or deleted any medications: No Patient Arrived: Cane Any new allergies or adverse reactions: No Arrival Time: 11:14 Had a fall or experienced change in No Accompanied By: husband activities of daily living that may affect Transfer Assistance: None risk of falls: Patient Identification Verified: Yes Signs or symptoms of abuse/neglect since last No Secondary Verification Process Yes visito Completed: Hospitalized since last visit: No Patient Requires Transmission- No Pain Present Now: No Based Precautions: Patient Has Alerts: Yes Patient Alerts: Patient on Blood Thinner eliquis BID DMII Electronic Signature(s) Signed: 05/23/2016 5:03:06 PM By: Gretta Cool, RN, BSN, Kim RN, BSN Entered By: Gretta Cool, RN, BSN, Kim on 05/23/2016 11:14:48 [name] ([id number]) -------------------------------------------------------------------------------- Clinic Level of Care Assessment Details Patient Name: [name] Date of Service: 05/23/2016 11:15 AM Medical Record Number: [id number] Patient Account Number: [id number] Date of Birth/Sex: November 14, 1945 (71 y.o. Female) Treating RN: Cornell Barman Primary Care Kimani Bedoya: Caryl Bis, ERIC Other Clinician: Referring Ziggy Chanthavong: Caryl Bis, ERIC Treating Zakariya Knickerbocker/Extender: Frann Rider in Treatment: 20 Clinic Level of Care Assessment Items TOOL 4 Quantity Score []  - Use when only an EandM is performed on FOLLOW-UP visit 0 ASSESSMENTS - Nursing  Assessment / Reassessment []  - Reassessment of Co-morbidities (includes updates in patient status) 0 X - Reassessment of Adherence to Treatment Plan 1 5 ASSESSMENTS - Wound and Skin Assessment / Reassessment X - Simple Wound Assessment / Reassessment - one wound 1 5 []  - Complex Wound Assessment / Reassessment - multiple wounds 0 []  - Dermatologic / Skin Assessment (not related to wound area) 0 ASSESSMENTS - Focused Assessment []  - Circumferential Edema Measurements - multi extremities 0 []  - Nutritional Assessment / Counseling / Intervention 0 []  - Lower Extremity Assessment (monofilament, tuning fork, pulses) 0 []  - Peripheral Arterial Disease Assessment (using hand held doppler) 0 ASSESSMENTS - Ostomy and/or Continence Assessment and Care []  - Incontinence Assessment and Management 0 []  - Ostomy Care Assessment and Management (repouching, etc.) 0 PROCESS - Coordination of Care X - Simple Patient / Family Education for ongoing care 1 15 []  - Complex (extensive) Patient / Family Education for ongoing care 0 X - Staff obtains Programmer, systems, Records, Test Results / Process Orders 1 10 []  - Staff telephones HHA, Nursing Homes / Clarify orders / etc 0 []  - Routine Transfer to another Facility (non-emergent condition) 0 SHERLEE, DETLEFSEN. ([id number]) []  - Routine Hospital Admission (non-emergent condition) 0 []  - New Admissions / Biomedical engineer / Ordering NPWT, Apligraf, etc. 0 []  - Emergency Hospital Admission (emergent condition) 0 X - Simple Discharge Coordination 1 10 []  - Complex (extensive) Discharge Coordination 0 PROCESS - Special Needs []  - Pediatric / Minor Patient Management 0 []  - Isolation Patient Management 0 []  - Hearing / Language / Visual special needs 0 []  - Assessment of Community assistance (transportation, D/C planning, etc.) 0 []  - Additional assistance / Altered mentation 0 []  - Support Surface(s) Assessment (bed, cushion, seat, etc.) 0 INTERVENTIONS - Wound  Cleansing / Measurement X - Simple Wound Cleansing - one wound 1 5 []  - Complex Wound Cleansing - multiple wounds 0 X - Wound Imaging (photographs -  any number of wounds) 1 5 []  - Wound Tracing (instead of photographs) 0 X - Simple Wound Measurement - one wound 1 5 []  - Complex Wound Measurement - multiple wounds 0 INTERVENTIONS - Wound Dressings []  - Small Wound Dressing one or multiple wounds 0 X - Medium Wound Dressing one or multiple wounds 1 15 []  - Large Wound Dressing one or multiple wounds 0 []  - Application of Medications - topical 0 []  - Application of Medications - injection 0 INTERVENTIONS - Miscellaneous []  - External ear exam 0 JYLL, MONROE. ([id number]) []  - Specimen Collection (cultures, biopsies, blood, body fluids, etc.) 0 []  - Specimen(s) / Culture(s) sent or taken to Lab for analysis 0 []  - Patient Transfer (multiple staff / Harrel Lemon Lift / Similar devices) 0 []  - Simple Staple / Suture removal (25 or less) 0 []  - Complex Staple / Suture removal (26 or more) 0 []  - Hypo / Hyperglycemic Management (close monitor of Blood Glucose) 0 []  - Ankle / Brachial Index (ABI) - do not check if billed separately 0 X - Vital Signs 1 5 Has the patient been seen at the hospital within the last three years: Yes Total Score: 80 Level Of Care: New/Established - Level 3 Electronic Signature(s) Signed: 05/23/2016 5:03:06 PM By: Gretta Cool, RN, BSN, Kim RN, BSN Entered By: Gretta Cool, RN, BSN, Kim on 05/23/2016 11:36:50 Koper, Sofie Hartigan ([id number]) -------------------------------------------------------------------------------- Encounter Discharge Information Details Patient Name: [name] Date of Service: 05/23/2016 11:15 AM Medical Record Number: [id number] Patient Account Number: [id number] Date of Birth/Sex: 1945/09/03 (71 y.o. Female) Treating RN: Cornell Barman Primary Care Yaneli Keithley: Caryl Bis, ERIC Other Clinician: Referring Jadine Brumley: Caryl Bis, ERIC Treating  Brenda Samano/Extender: Frann Rider in Treatment: 20 Encounter Discharge Information Items Schedule Follow-up Appointment: No Medication Reconciliation completed No and provided to Patient/Care Acasia Skilton: Provided on Clinical Summary of Care: 05/23/2016 Form Type Recipient Paper Patient SR Electronic Signature(s) Signed: 05/23/2016 11:36:17 AM By: Ruthine Dose Entered By: Ruthine Dose on 05/23/2016 11:36:17 Leppanen, Sofie Hartigan ([id number]) -------------------------------------------------------------------------------- Lower Extremity Assessment Details Patient Name: [name] Date of Service: 05/23/2016 11:15 AM Medical Record Number: [id number] Patient Account Number: [id number] Date of Birth/Sex: 07-Jul-1945 (71 y.o. Female) Treating RN: Cornell Barman Primary Care Alaja Goldinger: Caryl Bis, ERIC Other Clinician: Referring Franny Selvage: Caryl Bis, ERIC Treating Oree Hislop/Extender: Frann Rider in Treatment: 20 Vascular Assessment Claudication: Claudication Assessment [Left:None] Pulses: Dorsalis Pedis Palpable: [Left:Yes] Posterior Tibial Extremity colors, hair growth, and conditions: Extremity Color: [Left:Normal] Hair Growth on Extremity: [Left:No] Temperature of Extremity: [Left:Warm] Capillary Refill: [Left:< 3 seconds] Toe Nail Assessment Left: Right: Thick: No Discolored: No Deformed: No Improper Length and Hygiene: No Electronic Signature(s) Signed: 05/23/2016 5:03:06 PM By: Gretta Cool, RN, BSN, Kim RN, BSN Entered By: Gretta Cool, RN, BSN, Kim on 05/23/2016 11:36:04 [name] ([id number]) -------------------------------------------------------------------------------- Multi Wound Chart Details Patient Name: [name] Date of Service: 05/23/2016 11:15 AM Medical Record Number: [id number] Patient Account Number: [id number] Date of Birth/Sex: 10-Nov-1945 (71 y.o. Female) Treating RN: Cornell Barman Primary Care Adreanne Yono: Caryl Bis, ERIC Other  Clinician: Referring Shanitha Twining: Caryl Bis, ERIC Treating Jinelle Butchko/Extender: Frann Rider in Treatment: 20 Vital Signs Height(in): 63 Pulse(bpm): 72 Weight(lbs): 130 Blood Pressure 108/34 (mmHg): Body Mass Index(BMI): 23 Temperature(F): 98.2 Respiratory Rate 16 (breaths/min): Photos: [N/A:N/A] Wound Location: Left Calcaneus N/A N/A Wounding Event: Pressure Injury N/A N/A Primary Etiology: Pressure Ulcer N/A N/A Secondary Etiology: Diabetic Wound/Ulcer of N/A N/A the Lower Extremity Comorbid History: Cataracts, Type I N/A N/A Diabetes, Rheumatoid Arthritis, Osteoarthritis, Neuropathy, Received Chemotherapy Date Acquired: 07/15/2015  N/A N/A Weeks of Treatment: 20 N/A N/A Wound Status: Open N/A N/A Measurements L x W x D 0.3x0.4x0.2 N/A N/A (cm) Area (cm) : 0.094 N/A N/A Volume (cm) : 0.019 N/A N/A % Reduction in Area: 60.20% N/A N/A % Reduction in Volume: 79.80% N/A N/A Classification: Category/Stage III N/A N/A HBO Classification: Grade 1 N/A N/A Exudate Amount: Small N/A N/A Exudate Type: Sanguinous N/A N/A Exudate Color: red N/A N/A AINSLIE, LEAGUE ([id number]) Wound Margin: Flat and Intact N/A N/A Granulation Amount: Medium (34-66%) N/A N/A Granulation Quality: Pale N/A N/A Necrotic Amount: Small (1-33%) N/A N/A Exposed Structures: Fat Layer (Subcutaneous N/A N/A Tissue) Exposed: Yes Fascia: No Tendon: No Muscle: No Joint: No Bone: No Epithelialization: Medium (34-66%) N/A N/A Periwound Skin Texture: Callus: Yes N/A N/A Excoriation: No Induration: No Crepitus: No Rash: No Scarring: No Periwound Skin Maceration: No N/A N/A Moisture: Dry/Scaly: No Periwound Skin Color: Atrophie Blanche: No N/A N/A Cyanosis: No Ecchymosis: No Erythema: No Hemosiderin Staining: No Mottled: No Pallor: No Rubor: No Temperature: No Abnormality N/A N/A Tenderness on Yes N/A N/A Palpation: Wound Preparation: Ulcer Cleansing: N/A N/A Rinsed/Irrigated  with Saline Topical Anesthetic Applied: Other: lidocaine 4% Treatment Notes Electronic Signature(s) Signed: 05/23/2016 11:33:22 AM By: Christin Fudge MD, FACS Entered By: Christin Fudge on 05/23/2016 11:33:22 AYRA, FALCONER ([id number]) -------------------------------------------------------------------------------- Pain Assessment Details Patient Name: [name] Date of Service: 05/23/2016 11:15 AM Medical Record Number: [id number] Patient Account Number: [id number] Date of Birth/Sex: 1945-07-30 (71 y.o. Female) Treating RN: Cornell Barman Primary Care Aurelie Dicenzo: Caryl Bis, ERIC Other Clinician: Referring Bobbi Kozakiewicz: Caryl Bis, ERIC Treating Sandia Pfund/Extender: Frann Rider in Treatment: 20 Active Problems Location of Pain Severity and Description of Pain Patient Has Paino No Site Locations With Dressing Change: No Pain Management and Medication Current Pain Management: Electronic Signature(s) Signed: 05/23/2016 5:03:06 PM By: Gretta Cool, RN, BSN, Kim RN, BSN Entered By: Gretta Cool, RN, BSN, Kim on 05/23/2016 11:14:55 [name] ([id number]) -------------------------------------------------------------------------------- Patient/Caregiver Education Details Patient Name: [name] Date of Service: 05/23/2016 11:15 AM Medical Record Number: [id number] Patient Account Number: [id number] Date of Birth/Gender: 01-06-1946 (71 y.o. Female) Treating RN: Cornell Barman Primary Care Physician: Caryl Bis, ERIC Other Clinician: Referring Physician: Caryl Bis, ERIC Treating Physician/Extender: Frann Rider in Treatment: 20 Education Assessment Education Provided To: Patient Education Topics Provided Pressure: Handouts: Pressure Ulcers: Care and Offloading Methods: Demonstration Responses: State content correctly Wound/Skin Impairment: Handouts: Caring for Your Ulcer Methods: Demonstration Responses: State content correctly Electronic Signature(s) Signed:  05/23/2016 5:03:06 PM By: Gretta Cool, RN, BSN, Kim RN, BSN Entered By: Gretta Cool, RN, BSN, Kim on 05/23/2016 11:37:57 Burruel, Sofie Hartigan ([id number]) -------------------------------------------------------------------------------- Wound Assessment Details Patient Name: [name] Date of Service: 05/23/2016 11:15 AM Medical Record Number: [id number] Patient Account Number: [id number] Date of Birth/Sex: [birth year] (71 y.o. Female) Treating RN: Cornell Barman Primary Care Evangelyn Crouse: Caryl Bis, ERIC Other Clinician: Referring Trajan Grove: Caryl Bis, ERIC Treating Jacqueline Delapena/Extender: Frann Rider in Treatment: 20 Wound Status Wound Number: 2 Primary Pressure Ulcer Etiology: Wound Location: Left Calcaneus Secondary Diabetic Wound/Ulcer of the Lower Wounding Event: Pressure Injury Etiology: Extremity Date Acquired: 07/15/2015 Wound Open Weeks Of Treatment: 20 Status: Clustered Wound: No Comorbid Cataracts, Type I Diabetes, History: Rheumatoid Arthritis, Osteoarthritis, Neuropathy, Received Chemotherapy Photos Wound Measurements Length: (cm) 0.3 Width: (cm) 0.4 Depth: (cm) 0.2 Area: (cm) 0.094 Volume: (cm) 0.019 % Reduction in Area: 60.2% % Reduction in Volume: 79.8% Epithelialization: Medium (34-66%) Tunneling: No Undermining: No Wound Description 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 ([id number]) Muscle Exposed: No Joint Exposed: No Bone Exposed: No Periwound Skin Texture Texture Color No Abnormalities Noted: No No Abnormalities Noted: No Callus:  Yes Atrophie Blanche: No Crepitus: No Cyanosis: No Excoriation: No Ecchymosis: No Induration: No Erythema: No Rash: No Hemosiderin Staining: No Scarring: No Mottled: No Pallor: No Moisture Rubor: No No Abnormalities Noted: No Dry / Scaly: No Temperature / Pain Maceration: No Temperature: No Abnormality Tenderness on Palpation: Yes Wound Preparation Ulcer Cleansing: Rinsed/Irrigated with Saline Topical Anesthetic Applied: Other: lidocaine 4%, Treatment Notes Wound #2 (Left Calcaneus) 1. Cleansed with: Clean wound with Normal Saline 2. Anesthetic Topical Lidocaine 4% cream to wound bed prior to debridement 4. Dressing Applied: Other dressing (specify in notes) 5. Secondary Dressing Applied Bordered Foam Dressing Notes Endoform Electronic Signature(s) Signed: 05/23/2016 5:03:06 PM By: Gretta Cool, RN, BSN, Kim RN, BSN Entered By: Gretta Cool, RN, BSN, Kim on 05/23/2016 11:24:13 [name] ([id number]) -------------------------------------------------------------------------------- Vitals Details Patient Name: [name] Date of Service: 05/23/2016 11:15 AM Medical Record Number: [id number] Patient Account Number: [id number] Date of Birth/Sex: Feb 16, 1946 (71 y.o. Female) Treating RN: Cornell Barman Primary Care Kendyl Festa: Caryl Bis, ERIC Other Clinician: Referring Kamarius Buckbee: Caryl Bis, ERIC Treating Zian Delair/Extender: Frann Rider in Treatment: 20 Vital Signs Time Taken: 11:14 Temperature (F): 98.2 Height (in): 63 Pulse (bpm): 72 Weight (lbs): 130 Respiratory Rate (breaths/min): 16 Body Mass Index (BMI): 23 Blood Pressure (mmHg): 108/34 Reference Range: 80 - 120 mg / dl Notes Patient states the low diastolic is normal for her. MD notified. Electronic Signature(s) Signed: 05/23/2016 5:03:06 PM By: Gretta Cool, RN, BSN, Kim RN, BSN Entered By: Gretta Cool, RN, BSN, Kim on 05/23/2016 11:16:40

## 2016-05-25 ENCOUNTER — Inpatient Hospital Stay: Payer: Medicare Other

## 2016-05-25 ENCOUNTER — Telehealth: Payer: Self-pay | Admitting: *Deleted

## 2016-05-25 DIAGNOSIS — E10621 Type 1 diabetes mellitus with foot ulcer: Secondary | ICD-10-CM | POA: Diagnosis not present

## 2016-05-25 LAB — CA 125: CA 125: 60.2 U/mL — ABNORMAL HIGH (ref 0.0–38.1)

## 2016-05-25 MED ORDER — TBO-FILGRASTIM 300 MCG/0.5ML ~~LOC~~ SOSY
300.0000 ug | PREFILLED_SYRINGE | Freq: Once | SUBCUTANEOUS | Status: AC
  Administered 2016-05-25: 300 ug via SUBCUTANEOUS
  Filled 2016-05-25: qty 0.5

## 2016-05-25 MED ORDER — FILGRASTIM 300 MCG/0.5ML IJ SOSY: 300.0000 ug | PREFILLED_SYRINGE | Freq: Once | INTRAMUSCULAR | Status: DC

## 2016-05-25 NOTE — Telephone Encounter (Signed)
Called patient to inform her that her CA125 is improving.  Verbalized understanding and grateful for call.

## 2016-05-25 NOTE — Telephone Encounter (Signed)
-----   Message from Lequita Asal, MD sent at 05/25/2016  4:30 AM EST ----- Regarding: Please call patient re:  CA125   ----- Message ----- From: Interface, Lab In [place] Sent: 05/24/2016   9:15 AM To: Lequita Asal, MD

## 2016-05-26 ENCOUNTER — Encounter: Payer: Self-pay | Admitting: Hematology and Oncology

## 2016-05-26 ENCOUNTER — Emergency Department: Payer: Medicare Other

## 2016-05-26 ENCOUNTER — Observation Stay: Payer: Medicare Other

## 2016-05-26 ENCOUNTER — Inpatient Hospital Stay: Payer: Medicare Other

## 2016-05-26 ENCOUNTER — Encounter: Payer: Self-pay | Admitting: Emergency Medicine

## 2016-05-26 ENCOUNTER — Inpatient Hospital Stay
Admission: EM | Admit: 2016-05-26 | Discharge: 2016-05-28 | DRG: 064 | Disposition: A | Discharge status: Home / Self Care | Payer: Medicare Other | Attending: Internal Medicine | Admitting: Internal Medicine

## 2016-05-26 DIAGNOSIS — K8020 Calculus of gallbladder without cholecystitis without obstruction: Secondary | ICD-10-CM

## 2016-05-26 DIAGNOSIS — Z8701 Personal history of pneumonia (recurrent): Secondary | ICD-10-CM

## 2016-05-26 DIAGNOSIS — I2510 Atherosclerotic heart disease of native coronary artery without angina pectoris: Secondary | ICD-10-CM | POA: Diagnosis present

## 2016-05-26 DIAGNOSIS — Z9049 Acquired absence of other specified parts of digestive tract: Secondary | ICD-10-CM

## 2016-05-26 DIAGNOSIS — Z9221 Personal history of antineoplastic chemotherapy: Secondary | ICD-10-CM

## 2016-05-26 DIAGNOSIS — Z933 Colostomy status: Secondary | ICD-10-CM | POA: Diagnosis not present

## 2016-05-26 DIAGNOSIS — C569 Malignant neoplasm of unspecified ovary: Secondary | ICD-10-CM | POA: Diagnosis present

## 2016-05-26 DIAGNOSIS — L89623 Pressure ulcer of left heel, stage 3: Secondary | ICD-10-CM | POA: Diagnosis present

## 2016-05-26 DIAGNOSIS — Z9641 Presence of insulin pump (external) (internal): Secondary | ICD-10-CM

## 2016-05-26 DIAGNOSIS — L8990 Pressure ulcer of unspecified site, unspecified stage: Secondary | ICD-10-CM | POA: Insufficient documentation

## 2016-05-26 DIAGNOSIS — R402252 Coma scale, best verbal response, oriented, at arrival to emergency department: Secondary | ICD-10-CM | POA: Diagnosis present

## 2016-05-26 DIAGNOSIS — R402362 Coma scale, best motor response, obeys commands, at arrival to emergency department: Secondary | ICD-10-CM | POA: Diagnosis present

## 2016-05-26 DIAGNOSIS — Z862 Personal history of diseases of the blood and blood-forming organs and certain disorders involving the immune mechanism: Secondary | ICD-10-CM

## 2016-05-26 DIAGNOSIS — Z87891 Personal history of nicotine dependence: Secondary | ICD-10-CM

## 2016-05-26 DIAGNOSIS — I5032 Chronic diastolic (congestive) heart failure: Secondary | ICD-10-CM

## 2016-05-26 DIAGNOSIS — T451X5A Adverse effect of antineoplastic and immunosuppressive drugs, initial encounter: Secondary | ICD-10-CM | POA: Diagnosis present

## 2016-05-26 DIAGNOSIS — I6340 Cerebral infarction due to embolism of unspecified cerebral artery: Secondary | ICD-10-CM | POA: Diagnosis present

## 2016-05-26 DIAGNOSIS — M069 Rheumatoid arthritis, unspecified: Secondary | ICD-10-CM

## 2016-05-26 DIAGNOSIS — I10 Essential (primary) hypertension: Secondary | ICD-10-CM

## 2016-05-26 DIAGNOSIS — G459 Transient cerebral ischemic attack, unspecified: Secondary | ICD-10-CM

## 2016-05-26 DIAGNOSIS — K449 Diaphragmatic hernia without obstruction or gangrene: Secondary | ICD-10-CM

## 2016-05-26 DIAGNOSIS — E785 Hyperlipidemia, unspecified: Secondary | ICD-10-CM

## 2016-05-26 DIAGNOSIS — E876 Hypokalemia: Secondary | ICD-10-CM

## 2016-05-26 DIAGNOSIS — R402142 Coma scale, eyes open, spontaneous, at arrival to emergency department: Secondary | ICD-10-CM | POA: Diagnosis present

## 2016-05-26 DIAGNOSIS — M6281 Muscle weakness (generalized): Secondary | ICD-10-CM

## 2016-05-26 DIAGNOSIS — K219 Gastro-esophageal reflux disease without esophagitis: Secondary | ICD-10-CM | POA: Diagnosis present

## 2016-05-26 DIAGNOSIS — R0602 Shortness of breath: Secondary | ICD-10-CM

## 2016-05-26 DIAGNOSIS — C659 Malignant neoplasm of unspecified renal pelvis: Secondary | ICD-10-CM | POA: Diagnosis not present

## 2016-05-26 DIAGNOSIS — D6481 Anemia due to antineoplastic chemotherapy: Secondary | ICD-10-CM | POA: Diagnosis present

## 2016-05-26 DIAGNOSIS — Z808 Family history of malignant neoplasm of other organs or systems: Secondary | ICD-10-CM

## 2016-05-26 DIAGNOSIS — I252 Old myocardial infarction: Secondary | ICD-10-CM | POA: Diagnosis not present

## 2016-05-26 DIAGNOSIS — Z7952 Long term (current) use of systemic steroids: Secondary | ICD-10-CM

## 2016-05-26 DIAGNOSIS — E109 Type 1 diabetes mellitus without complications: Secondary | ICD-10-CM

## 2016-05-26 DIAGNOSIS — I6789 Other cerebrovascular disease: Secondary | ICD-10-CM | POA: Diagnosis not present

## 2016-05-26 DIAGNOSIS — R29701 NIHSS score 1: Secondary | ICD-10-CM | POA: Diagnosis present

## 2016-05-26 DIAGNOSIS — Z951 Presence of aortocoronary bypass graft: Secondary | ICD-10-CM

## 2016-05-26 DIAGNOSIS — K222 Esophageal obstruction: Secondary | ICD-10-CM

## 2016-05-26 DIAGNOSIS — Z794 Long term (current) use of insulin: Secondary | ICD-10-CM

## 2016-05-26 DIAGNOSIS — R918 Other nonspecific abnormal finding of lung field: Secondary | ICD-10-CM | POA: Diagnosis present

## 2016-05-26 DIAGNOSIS — Z7901 Long term (current) use of anticoagulants: Secondary | ICD-10-CM

## 2016-05-26 DIAGNOSIS — D638 Anemia in other chronic diseases classified elsewhere: Secondary | ICD-10-CM | POA: Diagnosis present

## 2016-05-26 DIAGNOSIS — Z885 Allergy status to narcotic agent status: Secondary | ICD-10-CM

## 2016-05-26 DIAGNOSIS — R4182 Altered mental status, unspecified: Secondary | ICD-10-CM | POA: Diagnosis present

## 2016-05-26 DIAGNOSIS — D72819 Decreased white blood cell count, unspecified: Secondary | ICD-10-CM

## 2016-05-26 DIAGNOSIS — I639 Cerebral infarction, unspecified: Secondary | ICD-10-CM

## 2016-05-26 DIAGNOSIS — I110 Hypertensive heart disease with heart failure: Secondary | ICD-10-CM | POA: Diagnosis present

## 2016-05-26 DIAGNOSIS — Z86718 Personal history of other venous thrombosis and embolism: Secondary | ICD-10-CM

## 2016-05-26 DIAGNOSIS — Z91040 Latex allergy status: Secondary | ICD-10-CM

## 2016-05-26 DIAGNOSIS — R4781 Slurred speech: Secondary | ICD-10-CM | POA: Diagnosis present

## 2016-05-26 DIAGNOSIS — Z79899 Other long term (current) drug therapy: Secondary | ICD-10-CM

## 2016-05-26 LAB — INFLUENZA PANEL BY PCR (TYPE A & B)
INFLBPCR: NEGATIVE
Influenza A By PCR: NEGATIVE

## 2016-05-26 LAB — COMPREHENSIVE METABOLIC PANEL
ALBUMIN: 3.4 g/dL — AB (ref 3.5–5.0)
ALT: 15 U/L (ref 14–54)
AST: 21 U/L (ref 15–41)
Alkaline Phosphatase: 72 U/L (ref 38–126)
Anion gap: 10 (ref 5–15)
BUN: 25 mg/dL — AB (ref 6–20)
CHLORIDE: 102 mmol/L (ref 101–111)
CO2: 24 mmol/L (ref 22–32)
Calcium: 9.4 mg/dL (ref 8.9–10.3)
Creatinine, Ser: 0.8 mg/dL (ref 0.44–1.00)
GFR calc Af Amer: >60 mL/min (ref >60)
GFR calc non Af Amer: >60 mL/min (ref >60)
GLUCOSE: 145 mg/dL — AB (ref 65–99)
Potassium: 4.3 mmol/L (ref 3.5–5.1)
SODIUM: 136 mmol/L (ref 135–145)
Total Bilirubin: 0.9 mg/dL (ref 0.3–1.2)
Total Protein: 7.2 g/dL (ref 6.5–8.1)

## 2016-05-26 LAB — AMMONIA: Ammonia: 46 umol/L — ABNORMAL HIGH (ref 9–35)

## 2016-05-26 LAB — URINALYSIS, COMPLETE (UACMP) WITH MICROSCOPIC
BILIRUBIN URINE: NEGATIVE
Bacteria, UA: NONE SEEN
Glucose, UA: 50 mg/dL — AB
Hgb urine dipstick: NEGATIVE
KETONES UR: 5 mg/dL — AB
LEUKOCYTES UA: NEGATIVE
Nitrite: NEGATIVE
PROTEIN: NEGATIVE mg/dL
Specific Gravity, Urine: 1.011 (ref 1.005–1.030)
pH: 6 (ref 5.0–8.0)

## 2016-05-26 LAB — GASTROINTESTINAL PANEL BY PCR, STOOL (REPLACES STOOL CULTURE)
ADENOVIRUS F40/41: NOT DETECTED
ASTROVIRUS: NOT DETECTED
CAMPYLOBACTER SPECIES: NOT DETECTED
Cryptosporidium: NOT DETECTED
Cyclospora cayetanensis: NOT DETECTED
ENTAMOEBA HISTOLYTICA: NOT DETECTED
ENTEROPATHOGENIC E COLI (EPEC): NOT DETECTED
ENTEROTOXIGENIC E COLI (ETEC): NOT DETECTED
Enteroaggregative E coli (EAEC): NOT DETECTED
Giardia lamblia: NOT DETECTED
NOROVIRUS GI/GII: NOT DETECTED
Plesimonas shigelloides: NOT DETECTED
Rotavirus A: NOT DETECTED
SAPOVIRUS (I, II, IV, AND V): NOT DETECTED
SHIGA LIKE TOXIN PRODUCING E COLI (STEC): NOT DETECTED
Salmonella species: NOT DETECTED
Shigella/Enteroinvasive E coli (EIEC): NOT DETECTED
VIBRIO CHOLERAE: NOT DETECTED
Vibrio species: NOT DETECTED
Yersinia enterocolitica: NOT DETECTED

## 2016-05-26 LAB — GLUCOSE, CAPILLARY
GLUCOSE-CAPILLARY: 187 mg/dL — AB (ref 65–99)
Glucose-Capillary: 193 mg/dL — ABNORMAL HIGH (ref 65–99)
Glucose-Capillary: 166 mg/dL — ABNORMAL HIGH (ref 65–99)
Glucose-Capillary: 158 mg/dL — ABNORMAL HIGH (ref 65–99)

## 2016-05-26 LAB — CBC
HEMATOCRIT: 26.5 % — AB (ref 35.0–47.0)
Hemoglobin: 9.3 g/dL — ABNORMAL LOW (ref 12.0–16.0)
MCH: 32.2 pg (ref 26.0–34.0)
MCHC: 35.1 g/dL (ref 32.0–36.0)
MCV: 91.5 fL (ref 80.0–100.0)
PLATELETS: 413 10*3/uL (ref 150–440)
RBC: 2.89 MIL/uL — ABNORMAL LOW (ref 3.80–5.20)
RDW: 20.1 % — AB (ref 11.5–14.5)
WBC: 16.2 10*3/uL — AB (ref 3.6–11.0)

## 2016-05-26 MED ORDER — MORPHINE SULFATE (PF) 4 MG/ML IV SOLN
4.0000 mg | Freq: Once | INTRAVENOUS | Status: AC
  Administered 2016-05-26: 4 mg via INTRAVENOUS
  Filled 2016-05-26: qty 1

## 2016-05-26 MED ORDER — METOPROLOL TARTRATE 25 MG PO TABS
25.0000 mg | ORAL_TABLET | Freq: Two times a day (BID) | ORAL | Status: DC
  Administered 2016-05-26 – 2016-05-27 (×3): 25 mg via ORAL
  Filled 2016-05-26 (×5): qty 1

## 2016-05-26 MED ORDER — ACETAMINOPHEN 325 MG PO TABS
650.0000 mg | ORAL_TABLET | Freq: Four times a day (QID) | ORAL | Status: DC | PRN
  Administered 2016-05-26 – 2016-05-27 (×2): 650 mg via ORAL
  Filled 2016-05-26 (×2): qty 2

## 2016-05-26 MED ORDER — ONDANSETRON HCL 4 MG/2ML IJ SOLN
INTRAMUSCULAR | Status: AC
  Administered 2016-05-26: 4 mg via INTRAVENOUS
  Filled 2016-05-26: qty 2

## 2016-05-26 MED ORDER — ONDANSETRON HCL 4 MG/2ML IJ SOLN
INTRAMUSCULAR | Status: AC
  Administered 2016-05-26: 4 mg via INTRAMUSCULAR
  Filled 2016-05-26: qty 2

## 2016-05-26 MED ORDER — PROMETHAZINE HCL 25 MG/ML IJ SOLN
12.5000 mg | Freq: Four times a day (QID) | INTRAMUSCULAR | Status: DC | PRN
  Administered 2016-05-26: 12.5 mg via INTRAVENOUS
  Filled 2016-05-26: qty 1

## 2016-05-26 MED ORDER — ONDANSETRON HCL 4 MG/2ML IJ SOLN
4.0000 mg | Freq: Four times a day (QID) | INTRAMUSCULAR | Status: DC | PRN
  Administered 2016-05-26: 10:00:00 4 mg via INTRAVENOUS
  Filled 2016-05-26: qty 2

## 2016-05-26 MED ORDER — FOLIC ACID 1 MG PO TABS
1.0000 mg | ORAL_TABLET | Freq: Every day | ORAL | Status: DC
  Administered 2016-05-26 – 2016-05-28 (×3): 1 mg via ORAL
  Filled 2016-05-26 (×3): qty 1

## 2016-05-26 MED ORDER — LORATADINE 10 MG PO TABS: 10.0000 mg | ORAL_TABLET | Freq: Every day | ORAL | Status: DC | PRN

## 2016-05-26 MED ORDER — MAGNESIUM OXIDE 400 (241.3 MG) MG PO TABS
400.0000 mg | ORAL_TABLET | Freq: Every day | ORAL | Status: DC
  Administered 2016-05-26 – 2016-05-28 (×3): 400 mg via ORAL
  Filled 2016-05-26 (×3): qty 1

## 2016-05-26 MED ORDER — OXYCODONE HCL 5 MG PO TABS
5.0000 mg | ORAL_TABLET | Freq: Four times a day (QID) | ORAL | Status: DC | PRN
  Filled 2016-05-26: qty 1

## 2016-05-26 MED ORDER — FUROSEMIDE 40 MG PO TABS: 80.0000 mg | ORAL_TABLET | Freq: Every day | ORAL | Status: DC

## 2016-05-26 MED ORDER — SODIUM CHLORIDE 0.9 % IV BOLUS (SEPSIS)
1000.0000 mL | Freq: Once | INTRAVENOUS | Status: AC
  Administered 2016-05-26: 1000 mL via INTRAVENOUS

## 2016-05-26 MED ORDER — GLUCERNA SHAKE PO LIQD
237.0000 mL | Freq: Three times a day (TID) | ORAL | Status: DC
  Administered 2016-05-26 – 2016-05-27 (×5): 237 mL via ORAL

## 2016-05-26 MED ORDER — VITAMIN C 500 MG PO TABS
500.0000 mg | ORAL_TABLET | Freq: Every day | ORAL | Status: DC
  Administered 2016-05-26 – 2016-05-28 (×3): 500 mg via ORAL
  Filled 2016-05-26 (×3): qty 1

## 2016-05-26 MED ORDER — INSULIN ASPART 100 UNIT/ML ~~LOC~~ SOLN: 0.0000 [IU] | Freq: Three times a day (TID) | SUBCUTANEOUS | Status: DC

## 2016-05-26 MED ORDER — FUROSEMIDE 40 MG PO TABS
40.0000 mg | ORAL_TABLET | ORAL | Status: DC
  Administered 2016-05-27: 09:00:00 40 mg via ORAL
  Filled 2016-05-26 (×2): qty 1

## 2016-05-26 MED ORDER — PANTOPRAZOLE SODIUM 40 MG PO TBEC
40.0000 mg | DELAYED_RELEASE_TABLET | Freq: Two times a day (BID) | ORAL | Status: DC
  Administered 2016-05-26 – 2016-05-28 (×5): 40 mg via ORAL
  Filled 2016-05-26 (×5): qty 1

## 2016-05-26 MED ORDER — LOSARTAN POTASSIUM 50 MG PO TABS
100.0000 mg | ORAL_TABLET | Freq: Every day | ORAL | Status: DC
  Administered 2016-05-26 – 2016-05-27 (×2): 100 mg via ORAL
  Filled 2016-05-26 (×3): qty 2

## 2016-05-26 MED ORDER — POTASSIUM CHLORIDE CRYS ER 20 MEQ PO TBCR
20.0000 meq | EXTENDED_RELEASE_TABLET | Freq: Every day | ORAL | Status: DC
  Administered 2016-05-26 – 2016-05-28 (×3): 20 meq via ORAL
  Filled 2016-05-26 (×3): qty 1

## 2016-05-26 MED ORDER — ZINC GLUCONATE 50 MG PO TABS
50.0000 mg | ORAL_TABLET | Freq: Every day | ORAL | Status: DC
  Administered 2016-05-26: 50 mg via ORAL
  Filled 2016-05-26 (×2): qty 1

## 2016-05-26 MED ORDER — APIXABAN 5 MG PO TABS
5.0000 mg | ORAL_TABLET | Freq: Two times a day (BID) | ORAL | Status: DC
  Administered 2016-05-26 – 2016-05-28 (×5): 5 mg via ORAL
  Filled 2016-05-26 (×5): qty 1

## 2016-05-26 MED ORDER — SODIUM CHLORIDE 0.9% FLUSH
3.0000 mL | Freq: Two times a day (BID) | INTRAVENOUS | Status: DC
  Administered 2016-05-26 – 2016-05-27 (×4): 3 mL via INTRAVENOUS

## 2016-05-26 MED ORDER — ATORVASTATIN CALCIUM 20 MG PO TABS
40.0000 mg | ORAL_TABLET | Freq: Every day | ORAL | Status: DC
  Administered 2016-05-26 – 2016-05-27 (×2): 40 mg via ORAL
  Filled 2016-05-26 (×2): qty 2

## 2016-05-26 MED ORDER — ONDANSETRON HCL 4 MG/2ML IJ SOLN
4.0000 mg | Freq: Once | INTRAMUSCULAR | Status: AC
  Administered 2016-05-26: 4 mg via INTRAVENOUS

## 2016-05-26 MED ORDER — INSULIN PUMP
Freq: Three times a day (TID) | SUBCUTANEOUS | Status: DC
  Administered 2016-05-26: 18:00:00 via SUBCUTANEOUS
  Administered 2016-05-27: 20 via SUBCUTANEOUS
  Administered 2016-05-27: 17:00:00 via SUBCUTANEOUS
  Administered 2016-05-27: 22:00:00 2 via SUBCUTANEOUS
  Administered 2016-05-27: 09:00:00 3.5 via SUBCUTANEOUS
  Administered 2016-05-28: 08:00:00 via SUBCUTANEOUS
  Administered 2016-05-28: 02:00:00 2 via SUBCUTANEOUS
  Administered 2016-05-28: 12:00:00 via SUBCUTANEOUS
  Filled 2016-05-26: qty 1

## 2016-05-26 MED ORDER — CALCIUM CARBONATE-VITAMIN D 500-200 MG-UNIT PO TABS
2.0000 | ORAL_TABLET | Freq: Every day | ORAL | Status: DC
  Administered 2016-05-26 – 2016-05-27 (×2): 2 via ORAL
  Filled 2016-05-26 (×3): qty 2

## 2016-05-26 MED ORDER — ONDANSETRON HCL 4 MG/2ML IJ SOLN
4.0000 mg | Freq: Once | INTRAMUSCULAR | Status: AC
  Administered 2016-05-26: 4 mg via INTRAVENOUS
  Filled 2016-05-26: qty 2

## 2016-05-26 NOTE — H&P (Signed)
[facility] NAME: [name]    MR#:  [id number]  DATE OF BIRTH:  [birth year]  DATE OF ADMISSION:  05/26/2016  PRIMARY CARE PHYSICIAN: Tommi Rumps, MD   REQUESTING/REFERRING PHYSICIAN: Dr.Brown  CHIEF COMPLAINT:   Chief Complaint  Patient presents with  . Altered Mental Status    HISTORY OF PRESENT ILLNESS: [name]  is a 71 y.o. female with a known history of Ovarian cancer with mets- on chemo- last was 2 days ago and received " injection for raising WBCs" yesterday. She was very confused and slurred speech this morning. Also had headache. She had 4-5 total episodes of vomit since then, have colostomy bag- no unusual stool or urinary symptoms. No fever.  Now she is somewhat more alert and oriented. But due to her cancer history- and elevated BP on presentation- and immune compromized status- advised to observe in hospital. UA or flu test is not sent yet, CT head is negative.  PAST MEDICAL HISTORY:   Past Medical History:  Diagnosis Date  . CAD (coronary artery disease)    a. 09/2012 Cath: LM nl, LAD 95p, 52m LCX 92mOM2 50, RCA 100.  . Marland Kitchenholelithiasis   . Chronic diastolic CHF (congestive heart failure) ([facility]  . [place] vascular disease ([facility]  . Esophageal stricture   . Exertional shortness of breath   . GERD (gastroesophageal reflux disease)   . H/O hiatal hernia   . Herniated disc   . History of pancytopenia   . Hyperlipidemia   . Hypertension   . Hypokalemia   . Leukopenia 2012   s/p bone marrow biopsy, Dr. PaMa Hillock. NSTEMI (non-ST elevated myocardial infarction) ([facility]/2014   "mild" (10/18/2012)  . Ovarian cancer ([facility]   chemo  . Pneumonia 2013; 08/2012   "one lung; double" (10/18/2012)  . Rheumatoid arthritis(714.0)   . Type I diabetes mellitus ([place]   "dx'd in 1957" (10/18/2012)    PAST SURGICAL HISTORY: Past Surgical History:  Procedure Laterality Date  . ABDOMINAL HYSTERECTOMY  06/15/2015    Dx L/S, EXLAP TAH BSO omentectomy RSRx colostomy diaphragm resection stripping  . CARDIAC CATHETERIZATION  10/18/2012   "first one was today" (10/18/2012)  . CATARACT EXTRACTION W/ INTRAOCULAR LENS IMPLANT Right 2010  . CHOLECYSTECTOMY  06/15/2015   combined case with ovarian cancer debulking  . COLON SURGERY    . CORONARY ARTERY BYPASS GRAFT N/A 10/19/2012   Procedure: CORONARY ARTERY BYPASS GRAFTING (CABG);  Surgeon: StMelrose NakayamaMD;  [facility]: [facility];  Laterality: N/A;  . ESOPHAGEAL DILATION     "3 or 4 times" (10/19/2011)  . ESOPHAGOGASTRODUODENOSCOPY  2012   Dr. IfMinna Merritts. OSTOMY    . OVARY SURGERY     removal  . PERIPHERAL VASCULAR CATHETERIZATION N/A 03/02/2015   Procedure: PoGlori Luisath Insertion;  Surgeon: JaAlgernon HuxleyMD;  [facility];  Service: Cardiovascular;  Laterality: N/A;  . TUBAL LIGATION  1970    SOCIAL HISTORY:  Social History  Substance Use Topics  . Smoking status: Former Smoker    Packs/day: 1.00    Years: 30.00    Types: Cigarettes    Quit date: 06/11/1994  . Smokeless tobacco: Never Used  . Alcohol use No    FAMILY HISTORY:  Family History  Problem Relation Age of Onset  . Diabetes Mother   . Arthritis Mother   . Diabetes Father   .  Arthritis Father   . Bone cancer Sister     DRUG ALLERGIES:  Allergies  Allergen Reactions  . Codeine Nausea And Vomiting  . Latex Rash    REVIEW OF SYSTEMS:   CONSTITUTIONAL: No fever, fatigue or weakness.  EYES: No blurred or double vision.  EARS, NOSE, AND THROAT: No tinnitus or ear pain.  RESPIRATORY: No cough, shortness of breath, wheezing or hemoptysis.  CARDIOVASCULAR: No chest pain, orthopnea, edema.  GASTROINTESTINAL: positive for nausea, vomiting,no diarrhea or abdominal pain.  GENITOURINARY: No dysuria, hematuria.  ENDOCRINE: No polyuria, nocturia,  HEMATOLOGY: No anemia, easy bruising or bleeding SKIN: No rash or lesion. MUSCULOSKELETAL: No  joint pain or arthritis.   NEUROLOGIC: No tingling, numbness, weakness.  PSYCHIATRY: No anxiety or depression.   MEDICATIONS AT HOME:  Prior to Admission medications   Medication Sig Start Date End Date Taking? Authorizing Provider  Calcium-Vitamin D 600-200 MG-UNIT tablet Take 2 tablets by mouth daily.     Historical Provider, MD  ELIQUIS 5 MG TABS tablet TAKE 1 TABLET BY MOUTH TWICE A DAY 05/16/16   Lequita Asal, MD  folic acid (FOLVITE) 1 MG tablet TAKE 1 TABLET BY MOUTH ONCE A DAY 09/02/15   Jackolyn Confer, MD  furosemide (LASIX) 80 MG tablet Take 80 mg by mouth daily as needed.  08/10/15   Historical Provider, MD  Insulin Human (INSULIN PUMP) SOLN Pt uses Humalog insulin.    Historical Provider, MD  insulin lispro (HUMALOG) 100 UNIT/ML injection Inject 0.06 mLs (6 Units total) into the skin daily. 11/20/15   Jackolyn Confer, MD  lidocaine-prilocaine (EMLA) cream Apply 1 application topically as needed (prior to accessing port).    Historical Provider, MD  loratadine (CLARITIN) 10 MG tablet Take 1 tablet by mouth daily as needed.    Historical Provider, MD  losartan (COZAAR) 100 MG tablet Take 1 tablet (100 mg total) by mouth daily. 02/03/16   Leone Haven, MD  magnesium oxide (MAG-OX) 400 (241.3 Mg) MG tablet Take 1 tablet (400 mg total) by mouth daily. 04/04/16   Lequita Asal, MD  metoprolol tartrate (LOPRESSOR) 25 MG tablet Take 1 tablet (25 mg total) by mouth 2 (two) times daily. 04/12/16   Minna Merritts, MD  ondansetron (ZOFRAN) 8 MG tablet TAKE 1 TABLET BY MOUTH EVERY 8 HOURS AS NEEDED FOR NAUSEA OR VOMITING 03/23/16   Lequita Asal, MD  ONE TOUCH ULTRA TEST test strip  11/09/15   Historical Provider, MD  oxyCODONE (OXY IR/ROXICODONE) 5 MG immediate release tablet Take 5 mg by mouth every 6 (six) hours as needed. Reported on 11/17/2015 08/11/15   Historical Provider, MD  pantoprazole (PROTONIX) 40 MG tablet TAKE 1 TABLET BY MOUTH TWICE A DAY 11/24/15   Jackolyn Confer, MD  potassium chloride SA (K-DUR,KLOR-CON) 20 MEQ tablet TAKE 2 TABLETS BY MOUTH ONCE A DAY 03/30/16   Lequita Asal, MD  potassium chloride SA (K-DUR,KLOR-CON) 20 MEQ tablet Take 1 tablet (20 mEq total) by mouth daily. 05/16/16   Lequita Asal, MD  ranitidine (ZANTAC) 150 MG tablet Take 1 tablet (150 mg total) by mouth 2 (two) times daily. 04/13/16   Leone Haven, MD  vitamin C (ASCORBIC ACID) 500 MG tablet Take 500 mg by mouth daily.    Historical Provider, MD  zinc gluconate 50 MG tablet Take 50 mg by mouth daily.    Historical Provider, MD      PHYSICAL EXAMINATION:   VITAL SIGNS:  Blood pressure (!) 172/55, pulse 82, temperature 97.9 F (36.6 C), temperature source Oral, resp. rate (!) 21, height _0  (1.6 m), weight 63 kg (139 lb), SpO2 97 %.  GENERAL:  71 y.o.-year-old patient lying in the bed with no acute distress.  EYES: Pupils equal, round, reactive to light and accommodation. No scleral icterus. Extraocular muscles intact.  HEENT: Head atraumatic, normocephalic. Oropharynx and nasopharynx clear.  NECK:  Supple, no jugular venous distention. No thyroid enlargement, no tenderness.  LUNGS: Normal breath sounds bilaterally, no wheezing, rales,rhonchi or crepitation. No use of accessory muscles of respiration.  CARDIOVASCULAR: S1, S2 normal. No murmurs, rubs, or gallops.  ABDOMEN: Soft, nontender, nondistended. Bowel sounds present. No organomegaly or mass. Colostomy in place. EXTREMITIES: No pedal edema, cyanosis, or clubbing.  NEUROLOGIC: Cranial nerves II through XII are intact. Muscle strength 5/5 in all extremities. Sensation intact. Gait not checked.  PSYCHIATRIC: The patient is alert and oriented x 3.  SKIN: No obvious rash, lesion, or ulcer.   LABORATORY PANEL:   CBC  Recent Labs Lab 05/24/16 0855 05/26/16 0507  WBC 2.6* 16.2*  HGB 8.4* 9.3*  HCT 24.2* 26.5*  PLT 324 413  MCV 91.5 91.5  MCH 31.7 32.2  MCHC 34.7 35.1  RDW 19.3* 20.1*   LYMPHSABS 0.7*  --   MONOABS 0.5  --   EOSABS 0.0  --   BASOSABS 0.1  --    ------------------------------------------------------------------------------------------------------------------  Chemistries   Recent Labs Lab 05/24/16 0855 05/26/16 0507  NA 135 136  K 4.2 4.3  CL 104 102  CO2 25 24  GLUCOSE 183* 145*  BUN 22* 25*  CREATININE 0.72 0.80  CALCIUM 8.8* 9.4  MG 1.7  --   AST 26 21  ALT 18 15  ALKPHOS 71 72  BILITOT 0.3 0.9   ------------------------------------------------------------------------------------------------------------------ estimated creatinine clearance is 56.9 mL/min (by C-G formula based on SCr of 0.8 mg/dL). ------------------------------------------------------------------------------------------------------------------  Recent Labs  05/24/16 1027  TSH 2.407     Coagulation profile No results for input(s): INR, PROTIME in the last 168 hours. ------------------------------------------------------------------------------------------------------------------- No results for input(s): DDIMER in the last 72 hours. -------------------------------------------------------------------------------------------------------------------  Cardiac Enzymes No results for input(s): CKMB, TROPONINI, MYOGLOBIN in the last 168 hours.  Invalid input(s): CK ------------------------------------------------------------------------------------------------------------------ Invalid input(s): POCBNP  ---------------------------------------------------------------------------------------------------------------  Urinalysis    Component Value Date/Time   COLORURINE YELLOW (A) 03/07/2016 1625   APPEARANCEUR CLEAR (A) 03/07/2016 1625   APPEARANCEUR Clear 09/21/2012 1210   LABSPEC 1.012 03/07/2016 1625   LABSPEC 1.017 09/21/2012 1210   PHURINE 6.0 03/07/2016 1625   GLUCOSEU >500 (A) 03/07/2016 1625   GLUCOSEU >=500 09/21/2012 1210   HGBUR NEGATIVE 03/07/2016  1625   BILIRUBINUR NEGATIVE 03/07/2016 1625   BILIRUBINUR Negative 09/21/2012 1210   KETONESUR NEGATIVE 03/07/2016 1625   PROTEINUR NEGATIVE 03/07/2016 1625   UROBILINOGEN 0.2 10/18/2012 2240   NITRITE NEGATIVE 03/07/2016 1625   LEUKOCYTESUR NEGATIVE 03/07/2016 1625   LEUKOCYTESUR Negative 09/21/2012 1210     RADIOLOGY: Ct Head Wo Contrast  Result Date: 05/26/2016 CLINICAL DATA:  Initial evaluation for sudden onset of confusion. EXAM: CT HEAD WITHOUT CONTRAST TECHNIQUE: Contiguous axial images were obtained from the base of the skull through the vertex without intravenous contrast. COMPARISON:  None. FINDINGS: Brain: Mild age-related cerebral atrophy with chronic small vessel ischemic disease. No acute intracranial hemorrhage. No evidence for acute large vessel territory infarct. No mass lesion, midline shift or mass effect. No hydrocephalus. No extra-axial fluid collection. Vascular: No hyperdense vessel. Vascular calcifications  noted within the carotid siphons. Skull: Scalp soft tissues within normal limits.  Calvarium intact. Sinuses/Orbits: Globes and orbital soft tissues within normal limits. Patient is status post lens extraction bilaterally. Paranasal sinuses and mastoid air cells are clear. IMPRESSION: 1. No acute intracranial process identified. 2. Mild age-related cerebral atrophy with chronic small vessel ischemic disease. Electronically Signed   By: Jeannine Boga M.D.   On: 05/26/2016 05:36    EKG: Orders placed or performed in visit on 04/12/16  . EKG 12-Lead    IMPRESSION AND PLAN:  * Altered mental status, headache, nausea.    She may have a stroke or TIA   MRI brain.   Also may have Norvo virus or flu- as she is immune compromized- may not have fever   Check GI panel and influenza.   Check UA  * cancer, mets , chemo,    Oncology consult,s he received neupogen as per husband - yesterday.  * Hypertension   Now coming under control, Cont home meds.  * DM    Keep on ISS.  * Hx of DVT   On eliquis, cont.  * Ch diastolic CHF   On lasix at home, cont.   All the records are reviewed and case discussed with ED provider. Management plans discussed with the patient, family and they are in agreement.  CODE STATUS: Full. Code Status History    Date Active Date Inactive Code Status Order ID Comments User Context   07/07/2015 10:49 PM 07/09/2015  8:26 PM Full Code [id number]  Lance Coon, MD Inpatient   07/01/2015  8:06 PM 07/03/2015  4:36 PM Full Code [id number]  Fritzi Mandes, MD Inpatient   03/02/2015 10:28 AM 03/03/2015 10:50 AM Full Code [id number]  Algernon Huxley, MD Inpatient   10/19/2012  3:03 PM 10/21/2012 12:23 PM Full Code [id number]  Nani Skillern, PA-C Inpatient    Advance Directive Documentation   Flowsheet Row Most Recent Value  Type of Advance Directive  Healthcare Power of Attorney  Pre-existing out of facility DNR order (yellow form or pink MOST form)  No data  "MOST" Form in Place?  No data       TOTAL TIME TAKING CARE OF THIS PATIENT: 50 minutes.  Pt's husband is present in room.  Vaughan Basta M.D on 05/26/2016   Between 7am to 6pm - Pager - [phone]  After 6pm go to [email or web address] - password [email or web address] [facility]  Office  [phone]  CC: Primary care physician; Tommi Rumps, MD   Note: This dictation was prepared with Dragon dictation along with smaller phrase technology. Any transcriptional errors that result from this process are unintentional.

## 2016-05-26 NOTE — Progress Notes (Signed)
Reviewed results. MRI positive for multiple embolic strokes. I spoke to neurologist for consult. Ordered Carotid doppler and Echo. Lipid panel was done one month ago.  Started on atorvastatin. Pt is already on eliquis. I spoke to pt's husband on phone and updated him about this findings.

## 2016-05-26 NOTE — ED Provider Notes (Signed)
[facility] Emergency Department Provider Note   First MD Initiated Contact with Patient 05/26/16 [phone]     (approximate)  I have reviewed the triage vital signs and the nursing notes.   HISTORY  Chief Complaint Altered Mental Status    HPI [name] is a 71 y.o. female with below list of chronic medical conditions including ovarian cancer currently undergoing chemotherapy presents to the emergency department with history of awakening at 2 AM with confusion, slurred speech per the patient's husband patient was not aware of where she was 1 year or even the fact that she has ovarian cancer. He states that her mentation has improved gradually however she still markedly confused patient is confused here in the emergency department unaware of what day or year it is.   Past Medical History:  Diagnosis Date  . CAD (coronary artery disease)    a. 09/2012 Cath: LM nl, LAD 95p, 31m LCX 966mOM2 50, RCA 100.  . Marland Kitchenholelithiasis   . Chronic diastolic CHF (congestive heart failure) (HCIsola  . Collagen vascular disease (HCRidgefield  . Esophageal stricture   . Exertional shortness of breath   . GERD (gastroesophageal reflux disease)   . H/O hiatal hernia   . Herniated disc   . History of pancytopenia   . Hyperlipidemia   . Hypertension   . Hypokalemia   . Leukopenia 2012   s/p bone marrow biopsy, Dr. PaMa Hillock. NSTEMI (non-ST elevated myocardial infarction) (HCShell Valley5/2014   "mild" (10/18/2012)  . Ovarian cancer (HCCanal Point2016   chemo  . Pneumonia 2013; 08/2012   "one lung; double" (10/18/2012)  . Rheumatoid arthritis(714.0)   . Type I diabetes mellitus (HCWaverly   "dx'd in 1957" (10/18/2012)    Patient Active Problem List   Diagnosis Date Noted  . Hx of CABG 04/12/2016  . Encounter for antineoplastic chemotherapy 03/31/2016  . Heel ulcer (HCBreezy Point08/01/2016  . Bile duct abnormality 12/09/2015  . Diabetic ulcer of left heel associated with type 1 diabetes mellitus (HCTerramuggus 12/07/2015  . Alkaline phosphatase elevation 11/17/2015  . Leukopenia 11/06/2015  . Weight loss 09/17/2015  . Liver abscess 09/03/2015  . Thrombosis of right internal jugular vein (HCLemannville05/08/2015  . Fatigue 07/17/2015  . Recurrent pleural effusion on right 07/01/2015  . Decubitus ulcer 05/19/2015  . Hyponatremia 05/15/2015  . Neuropathy (HCMammoth01/13/2017  . Malignant neoplasm of right ovary (HCKeene01/08/2015  . Bilateral lower extremity edema 03/30/2015  . Hypomagnesemia 03/05/2015  . Diarrhea 02/26/2015  . Peritoneal carcinomatosis (HCCromberg10/18/2016  . Anemia 02/17/2015  . Adnexal mass 02/13/2015  . Angina pectoris associated with type 2 diabetes mellitus (HCLake Arthur10/03/2015  . History of esophageal stricture 07/08/2014  . Dysphagia, pharyngoesophageal phase 07/08/2014  . Candida esophagitis (HCClintonville01/14/2016  . Cataracts, bilateral 01/13/2014  . Medicare annual wellness visit, subsequent 09/20/2013  . CAD (coronary artery disease) of artery bypass graft 11/05/2012  . Anemia, iron deficiency 10/09/2012  . CAD (coronary artery disease) 10/01/2012  . Hearing loss 07/06/2011  . Diabetes type 1, controlled (HCRandlett03/10/2011  . Gastroesophageal reflux disease with stricture 07/06/2011  . GERD (gastroesophageal reflux disease) 04/05/2011  . Osteoporosis, unspecified 04/05/2011  . Hypertension 01/10/2011  . Hyperlipidemia 01/10/2011  . RA (rheumatoid arthritis) (HCStratmoor09/01/2011    Past Surgical History:  Procedure Laterality Date  . ABDOMINAL HYSTERECTOMY  06/15/2015   Dx L/S, EXLAP TAH BSO omentectomy RSRx colostomy diaphragm resection stripping  . CARDIAC CATHETERIZATION  10/18/2012   "first one was today" (10/18/2012)  . CATARACT EXTRACTION W/ INTRAOCULAR LENS IMPLANT Right 2010  . CHOLECYSTECTOMY  06/15/2015   combined case with ovarian cancer debulking  . COLON SURGERY    . CORONARY ARTERY BYPASS GRAFT N/A 10/19/2012   Procedure: CORONARY ARTERY BYPASS GRAFTING (CABG);  Surgeon:  Melrose Nakayama, MD;  Location: [facility];  Service: Open Heart Surgery;  Laterality: N/A;  . ESOPHAGEAL DILATION     "3 or 4 times" (10/19/2011)  . ESOPHAGOGASTRODUODENOSCOPY  2012   Dr. Minna Merritts  . OSTOMY    . OVARY SURGERY     removal  . PERIPHERAL VASCULAR CATHETERIZATION N/A 03/02/2015   Procedure: Glori Luis Cath Insertion;  Surgeon: Algernon Huxley, MD;  Location: [place] [facility];  Service: Cardiovascular;  Laterality: N/A;  . [place]    Prior to Admission medications   Medication Sig Start Date End Date Taking? Authorizing Provider  Calcium-Vitamin D 600-200 MG-UNIT tablet Take 2 tablets by mouth daily.     Historical Provider, MD  ELIQUIS 5 MG TABS tablet TAKE 1 TABLET BY MOUTH TWICE A DAY 05/16/16   Lequita Asal, MD  folic acid (FOLVITE) 1 MG tablet TAKE 1 TABLET BY MOUTH ONCE A DAY 09/02/15   Jackolyn Confer, MD  furosemide (LASIX) 80 MG tablet Take 80 mg by mouth daily as needed.  08/10/15   Historical Provider, MD  Insulin Human (INSULIN PUMP) SOLN Pt uses Humalog insulin.    Historical Provider, MD  insulin lispro (HUMALOG) 100 UNIT/ML injection Inject 0.06 mLs (6 Units total) into the skin daily. 11/20/15   Jackolyn Confer, MD  lidocaine-prilocaine (EMLA) cream Apply 1 application topically as needed (prior to accessing port).    Historical Provider, MD  loratadine (CLARITIN) 10 MG tablet Take 1 tablet by mouth daily as needed.    Historical Provider, MD  losartan (COZAAR) 100 MG tablet Take 1 tablet (100 mg total) by mouth daily. 02/03/16   Leone Haven, MD  magnesium oxide (MAG-OX) 400 (241.3 Mg) MG tablet Take 1 tablet (400 mg total) by mouth daily. 04/04/16   Lequita Asal, MD  metoprolol tartrate (LOPRESSOR) 25 MG tablet Take 1 tablet (25 mg total) by mouth 2 (two) times daily. 04/12/16   Minna Merritts, MD  ondansetron (ZOFRAN) 8 MG tablet TAKE 1 TABLET BY MOUTH EVERY 8 HOURS AS NEEDED FOR NAUSEA OR VOMITING 03/23/16   Lequita Asal, MD  ONE  TOUCH ULTRA TEST test strip  11/09/15   Historical Provider, MD  oxyCODONE (OXY IR/ROXICODONE) 5 MG immediate release tablet Take 5 mg by mouth every 6 (six) hours as needed. Reported on 11/17/2015 08/11/15   Historical Provider, MD  pantoprazole (PROTONIX) 40 MG tablet TAKE 1 TABLET BY MOUTH TWICE A DAY 11/24/15   Jackolyn Confer, MD  potassium chloride SA (K-DUR,KLOR-CON) 20 MEQ tablet TAKE 2 TABLETS BY MOUTH ONCE A DAY 03/30/16   Lequita Asal, MD  potassium chloride SA (K-DUR,KLOR-CON) 20 MEQ tablet Take 1 tablet (20 mEq total) by mouth daily. 05/16/16   Lequita Asal, MD  ranitidine (ZANTAC) 150 MG tablet Take 1 tablet (150 mg total) by mouth 2 (two) times daily. 04/13/16   Leone Haven, MD  vitamin C (ASCORBIC ACID) 500 MG tablet Take 500 mg by mouth daily.    Historical Provider, MD  zinc gluconate 50 MG tablet Take 50 mg by mouth daily.    Historical Provider,  MD    Allergies Codeine and Latex  Family History  Problem Relation Age of Onset  . Diabetes Mother   . Arthritis Mother   . Diabetes Father   . Arthritis Father   . Bone cancer Sister     Social History Social History  Substance Use Topics  . Smoking status: Former Smoker    Packs/day: 1.00    Years: 30.00    Types: Cigarettes    Quit date: 06/11/1994  . Smokeless tobacco: Never Used  . Alcohol use No    Review of Systems Constitutional: No fever/chills Eyes: No visual changes. ENT: No sore throat. Cardiovascular: Denies chest pain. Respiratory: Denies shortness of breath. Gastrointestinal: No abdominal pain.  Positive for vomiting..  No diarrhea.  No constipation. Genitourinary: Negative for dysuria. Musculoskeletal: Negative for back pain. Skin: Negative for rash. Neurological:Positive for confusion, positive for headache. Negative for, focal weakness or numbness.  10-point ROS otherwise negative.  ____________________________________________   PHYSICAL EXAM:  VITAL SIGNS: ED Triage  Vitals  Enc Vitals Group     BP 05/26/16 0509 (!) 183/65     Pulse Rate 05/26/16 0509 85     Resp 05/26/16 0509 20     Temp 05/26/16 0509 97.9 F (36.6 C)     Temp Source 05/26/16 0509 Oral     SpO2 05/26/16 0509 100 %     Weight 05/26/16 0511 139 lb (63 kg)     Height 05/26/16 0511 5' 3"  (1.6 m)     Head Circumference --      Peak Flow --      Pain Score 05/26/16 0511 0     Pain Loc --      Pain Edu? --      Excl. in [place]? --     Constitutional: Alert, Oriented to day and date and situation. Eyes: Conjunctivae are normal. PERRL. EOMI. Head: Atraumatic. Mouth/Throat: Mucous membranes are moist.  Oropharynx non-erythematous. Neck: No stridor.   Cardiovascular: Normal rate, regular rhythm. Good peripheral circulation. Grossly normal heart sounds. Respiratory: Normal respiratory effort.  No retractions. Lungs CTAB. Gastrointestinal: Soft and nontender. No distention.  Musculoskeletal: No lower extremity tenderness nor edema. No gross deformities of extremities. Neurologic:  Normal speech and language. No gross focal neurologic deficits are appreciated.  Skin:  Skin is warm, dry and intact. No rash noted. Psychiatric: Mood and affect are normal. Speech and behavior are normal. ____________________________________________   LABS (all labs ordered are listed, but only abnormal results are displayed)  Labs Reviewed  CBC - Abnormal; Notable for the following:       Result Value   WBC 16.2 (*)    RBC 2.89 (*)    Hemoglobin 9.3 (*)    HCT 26.5 (*)    RDW 20.1 (*)    All other components within normal limits  COMPREHENSIVE METABOLIC PANEL - Abnormal; Notable for the following:    Glucose, Bld 145 (*)    BUN 25 (*)    Albumin 3.4 (*)    All other components within normal limits  URINALYSIS, COMPLETE (UACMP) WITH MICROSCOPIC  AMMONIA     RADIOLOGY I, [place] N BROWN, personally viewed and evaluated these images (plain radiographs) as part of my medical decision making, as  well as reviewing the written report by the radiologist.  Ct Head Wo Contrast  Result Date: 05/26/2016 CLINICAL DATA:  Initial evaluation for sudden onset of confusion. EXAM: CT HEAD WITHOUT CONTRAST TECHNIQUE: Contiguous axial images were obtained from the  base of the skull through the vertex without intravenous contrast. COMPARISON:  None. FINDINGS: Brain: Mild age-related cerebral atrophy with chronic small vessel ischemic disease. No acute intracranial hemorrhage. No evidence for acute large vessel territory infarct. No mass lesion, midline shift or mass effect. No hydrocephalus. No extra-axial fluid collection. Vascular: No hyperdense vessel. Vascular calcifications noted within the carotid siphons. Skull: Scalp soft tissues within normal limits.  Calvarium intact. Sinuses/Orbits: Globes and orbital soft tissues within normal limits. Patient is status post lens extraction bilaterally. Paranasal sinuses and mastoid air cells are clear. IMPRESSION: 1. No acute intracranial process identified. 2. Mild age-related cerebral atrophy with chronic small vessel ischemic disease. Electronically Signed   By: Jeannine Boga M.D.   On: 05/26/2016 05:36     Procedures     INITIAL IMPRESSION / ASSESSMENT AND PLAN / ED COURSE  Pertinent labs & imaging results that were available during my care of the patient were reviewed by me and considered in my medical decision making (see chart for details).  ----------------------------------------- 7:10 AM on 05/26/2016 -----------------------------------------  Patient reevaluated alert and oriented times three      ____________________________________________  FINAL CLINICAL IMPRESSION(S) / ED DIAGNOSES  Final diagnoses:  Transient cerebral ischemia, unspecified type     MEDICATIONS GIVEN DURING THIS VISIT:  Medications  morphine 4 MG/ML injection 4 mg (not administered)  ondansetron (ZOFRAN) injection 4 mg (4 mg Intravenous Given 05/26/16  0528)  sodium chloride 0.9 % bolus 1,000 mL (1,000 mLs Intravenous New Bag/Given 05/26/16 0547)  ondansetron (ZOFRAN) injection 4 mg (4 mg Intravenous Given 05/26/16 0654)     NEW OUTPATIENT MEDICATIONS STARTED DURING THIS VISIT:  New Prescriptions   No medications on file    Modified Medications   No medications on file    Discontinued Medications   No medications on file     Note:  This document was prepared using Dragon voice recognition software and may include unintentional dictation errors.    Gregor Hams, MD 05/26/16 [phone]

## 2016-05-26 NOTE — Progress Notes (Signed)
Inpatient Diabetes Program Recommendations  AACE/ADA: New Consensus Statement on Inpatient Glycemic Control (2015)  Target Ranges:  Prepandial:   less than 140 mg/dL      Peak postprandial:   less than 180 mg/dL (1-2 hours)      Critically ill patients:  140 - 180 mg/dL   Lab Results  Component Value Date   GLUCAP 86 12/02/2015   HGBA1C 7.1 (A) 10/17/2014    Review of Glycemic Control  Patient of Dr. Lucilla Lame, last seen in her practice on 03/11/16.  She has Type 1 diabetes so if this patient is unable to manage her insulin pump, she will need an order for basal, bolus/mealtime and correction insulin. On 11/10 Dr. Gabriel Carina made some pump changes.  Based on the note, patient takes a total of 18.925 units of basal insulin a day.  Basal- If the pump is remove, please order Lantus 15 units q day- give this approximately 1-2 hours before the pump is removed.   Mealtime- Please order Novolog  1- 5 units tid with meal (note should read 1 unit per 7 grams of carbohydrate)  Correction- Novolog correction insulin order;  CBG 151-200 mg/dl=1 unit      CBG 201-250mg /dl = 2 units      CBG 251-300mg /dl = 3 units      CBG 301-350mg /dl=4 units      CBG 351-400mg /dl = 5 units      CBG >  400mg /dl=6 units  From Dr. Joycie Peek note in November 2017,  "*Continue with insulin pump. Modified settings to target hyperglycemia. New settings: Basal rate  12:00 a.m. 0.5 units an hour 4:00 a.m. 0.625 units an hour 5:00 am 1.1 units an hour 11 am 1.3 units an hour 3:00 p.m. 0.5 units an hour total daily basal rate of 18.925 units.   Bolus settings 1:C ratio 12 AM 1:8.5, 4 PM 1:7 Sensitivity 46 Target 120-120 Active insulin time 4 hours"  Dr. Joycie Peek note stated;  "*Advised her to notify me if steroids are given with chemo as we may need another basal rate or "temp basal" for the period around the high dose steroids."  Please consult Dr. Gabriel Carina if patient is admitted.  Gentry Fitz, RN, BA, MHA,  CDE Diabetes Coordinator Inpatient Diabetes Program  [phone] (Team Pager) [phone] ([place]) 05/26/2016 8:13 AM

## 2016-05-26 NOTE — ED Notes (Signed)
MD at the bedside for pt evaluation  

## 2016-05-26 NOTE — Plan of Care (Signed)
Problem: Education: Goal: Knowledge of [facility] Education information/materials will improve Outcome: Progressing Flu a/b neg

## 2016-05-26 NOTE — Consult Note (Signed)
[facility]  Date of admission:  05/26/2016  Inpatient day:  05/26/2016  Consulting physician:  Dr. Vaughan Basta  Reason for Consultation:  Cancer, chemo, confusion.  Chief Complaint: [name] is a 71 y.o. female with stage IV ovarian cancer who was admitted through the emergency room with acute confusion.  HPI: The patient was seen in the medical oncology clinic on 05/24/2016.  At that time she was doing well.  She received day 1 of cycle #4 carboplatin and gemcitabine.  Counts included a hematocrit of 24.2, hemoglobin 8.4, WBC 2600, ANC 1300, and platelets 324,000.  Counts are at baseline low secondary to severe rheumatoid arthritis.  She received 300 mcg Neupogen (Granix) on 05/25/2016.  She awoke at 2 am this morning confused and disoriented.  She did not know where she was or that she had cancer.  She had a severe headache which resulted in several episodes of vomiting.  BP was 184/94.  Blood sugar was good.  She had no fever.  She had no apparent seizure.  911 was called.  ER evaluation revealed a normal non-contrast head CT.  CBC revealed a hematocrit of 26.5, hemoglobin 9.3, WBC 16,200, and platelets 413,000.    She has improved since admission.  She denies any focal numbness or weakness.  Memory for recent events is improving.  Head MRI today revealed multiple small areas of acute infarct involving the occipital parietal lobe bilaterally and the left lateral cerebellum, consistent with posterior circulation emboli.  Carotid duplex study is pending.   Past Medical History:  Diagnosis Date  . CAD (coronary artery disease)    a. 09/2012 Cath: LM nl, LAD 95p, 64m LCX 927mOM2 50, RCA 100.  . Marland Kitchenholelithiasis   . Chronic diastolic CHF (congestive heart failure) ([facility]  . Collagen vascular disease ([facility] [place]  . Esophageal stricture   . Exertional shortness of breath   . GERD (gastroesophageal reflux disease)   . H/O hiatal hernia   . Herniated disc   .  History of pancytopenia   . Hyperlipidemia   . Hypertension   . Hypokalemia   . Leukopenia 2012   s/p bone marrow biopsy, Dr. PaMa Hillock. NSTEMI (non-ST elevated myocardial infarction) ([place]2014   "mild" (10/18/2012)  . Ovarian cancer ([facility]   chemo  . Pneumonia 2013; 08/2012   "one lung; double" (10/18/2012)  . Rheumatoid arthritis(714.0)   . Type I diabetes mellitus ([facility]   "dx'd in 1957" (10/18/2012)    Past Surgical History:  Procedure Laterality Date  . ABDOMINAL HYSTERECTOMY  06/15/2015   Dx L/S, EXLAP TAH BSO omentectomy RSRx colostomy diaphragm resection stripping  . CARDIAC CATHETERIZATION  10/18/2012   "first one was today" (10/18/2012)  . CATARACT EXTRACTION W/ INTRAOCULAR LENS IMPLANT Right 2010  . CHOLECYSTECTOMY  06/15/2015   combined case with ovarian cancer debulking  . COLON SURGERY    . CORONARY ARTERY BYPASS GRAFT N/A 10/19/2012   Procedure: CORONARY ARTERY BYPASS GRAFTING (CABG);  Surgeon: StMelrose NakayamaMD;  [place] Service: Open Heart Surgery;  Laterality: N/A;  . ESOPHAGEAL DILATION     "3 or 4 times" (10/19/2011)  . ESOPHAGOGASTRODUODENOSCOPY  2012   Dr. IfMinna Merritts. OSTOMY    . OVARY SURGERY     removal  . PERIPHERAL VASCULAR CATHETERIZATION N/A 03/02/2015   Procedure: PoGlori Luisath Insertion;  Surgeon: JaAlgernon HuxleyMD;  [facility];  Service: Cardiovascular;  Laterality:  N/A;  . TUBAL LIGATION  1970    Family History  Problem Relation Age of Onset  . Diabetes Mother   . Arthritis Mother   . Diabetes Father   . Arthritis Father   . Bone cancer Sister     Social History:  reports that she quit smoking about 21 years ago. Her smoking use included Cigarettes. She has a 30.00 pack-year smoking history. She has never used smokeless tobacco. She reports that she does not drink alcohol or use drugs.  She is accompanied by her husband.  Allergies:  Allergies  Allergen Reactions  . Codeine Nausea And Vomiting  . Latex  Rash    Medications Prior to Admission  Medication Sig Dispense Refill  . Calcium-Vitamin D 600-200 MG-UNIT tablet Take 2 tablets by mouth daily.     Marland Kitchen ELIQUIS 5 MG TABS tablet TAKE 1 TABLET BY MOUTH TWICE A DAY 60 tablet 0  . enoxaparin (LOVENOX) 60 MG/0.6ML injection Inject 60 mg into the skin every 12 (twelve) hours.    . folic acid (FOLVITE) 1 MG tablet TAKE 1 TABLET BY MOUTH ONCE A DAY 90 tablet 3  . insulin lispro (HUMALOG) 100 UNIT/ML injection Inject 0.06 mLs (6 Units total) into the skin daily. (Patient taking differently: Inject 0-85 Units into the skin daily. Via insulin pump) 30 mL 3  . lidocaine-prilocaine (EMLA) cream Apply 1 application topically as needed (prior to accessing port).    . loratadine (CLARITIN) 10 MG tablet Take 1 tablet by mouth daily as needed.    Marland Kitchen losartan (COZAAR) 100 MG tablet Take 1 tablet (100 mg total) by mouth daily. 90 tablet 1  . magnesium oxide (MAG-OX) 400 (241.3 Mg) MG tablet Take 1 tablet (400 mg total) by mouth daily. 30 tablet 2  . metoprolol tartrate (LOPRESSOR) 25 MG tablet Take 1 tablet (25 mg total) by mouth 2 (two) times daily. 180 tablet 3  . ondansetron (ZOFRAN) 8 MG tablet TAKE 1 TABLET BY MOUTH EVERY 8 HOURS AS NEEDED FOR NAUSEA OR VOMITING 30 tablet 1  . pantoprazole (PROTONIX) 40 MG tablet TAKE 1 TABLET BY MOUTH TWICE A DAY 180 tablet 1  . potassium chloride SA (K-DUR,KLOR-CON) 20 MEQ tablet Take 1 tablet (20 mEq total) by mouth daily. (Patient taking differently: Take 40 mEq by mouth daily. ) 20 tablet 0  . predniSONE (DELTASONE) 5 MG tablet Take 5 mg by mouth daily with breakfast.    . ranitidine (ZANTAC) 150 MG tablet Take 1 tablet (150 mg total) by mouth 2 (two) times daily. 180 tablet 1  . vitamin C (ASCORBIC ACID) 500 MG tablet Take 500 mg by mouth daily.    Marland Kitchen zinc gluconate 50 MG tablet Take 50 mg by mouth daily.    . ONE TOUCH ULTRA TEST test strip       Review of Systems: GENERAL:  Feels better.  No fever, chills or  sweats. PERFORMANCE STATUS (ECOG):  1 HEENT:  Slight visial change.  No sore throat, mouth sores or tenderness. Lungs:  No shortness of breath or cough.  No hemoptysis. Cardiac:  No chest pain, palpitations, orthopnea, or PND. GI:  Nausea and vomiting associated with headache.  No abdominal pain or distention.  No diarrhea, melena or hematochezia. GU:  No urgency, frequency, dysuria, or hematuria. Musculoskeletal:  Severe rheumatoid arthritis (off MTX and on prednisone).  Osteoporosis.  No back pain. No muscle tenderness. Extremities:  No pain or swelling. Skin:  No increased bruising or bleeding on  Eliquis.  No rashes or skin changes. Neuro:  Headache.  Interval confusion (resolved).  Baseline neuropathy.  No focal numbness or weakness, balance or coordination issues. Endocrine:  Diabetes on an insulin pump.  Blood sugar good.  No thyroid issues, hot flashes or night sweats. Psych:  No mood changes, depression or anxiety. Pain: No pain. Review of systems:  All other systems reviewed and found to be negative.  Physical Exam:  Blood pressure (!) 121/40, pulse 71, temperature 98.1 F (36.7 C), temperature source Oral, resp. rate 18, height 5' 3"  (1.6 m), weight 143 lb 4.8 oz (65 kg), SpO2 96 %.  GENERAL:  Well developed, well nourished woman sitting comfortably on the medical unit in no acute distress.  She has an emesis bag in her hand. MENTAL STATUS: Alert and oriented to person, place and time. HEAD:  Curly gray hair.  Normocephalic, atraumatic, face symmetric, no Cushingoid features. EYES:  Gold rimmed glasses.  Blue eyes.  No conjunctivitis or scleral icterus.  ENT: Oropharynx clear without lesion. Tongue normal. Mucous membranes moist.  RESPIRATORY: Clear to auscultation without rales, wheezes or rhonchi. CARDIOVASCULAR: Regular rate and rhythm without murmur, rub or gallop. ABDOMEN: Soft, non-tender with active bowel sounds and no hepatosplenomegaly. No palpable nodularity or  masses.  No shifting dullness.  Insulin pump. SKIN: No rashes, ulcers, or bruises. EXTREMITIES:  No edema, skin discoloration or tenderness. No palpable cords. LYMPH NODES: No palpable cervical, supraclavicular, axillary or inguinal adenopathy  NEUROLOGICAL:  Alert & oriented, cranial nerves II-XII intact except for subtle change in speech; motor strength 5/5 throughout; sensation intact; no clonus or Babinski; gait not tested.  PSYCH: Appropriate.   Results for orders placed or performed during the hospital encounter of 05/26/16 (from the past 48 hour(s))  CBC     Status: Abnormal   Collection Time: 05/26/16  5:07 AM  Result Value Ref Range   WBC 16.2 (H) 3.6 - 11.0 K/uL   RBC 2.89 (L) 3.80 - 5.20 MIL/uL   Hemoglobin 9.3 (L) 12.0 - 16.0 g/dL   HCT 26.5 (L) 35.0 - 47.0 %   MCV 91.5 80.0 - 100.0 fL   MCH 32.2 26.0 - 34.0 pg   MCHC 35.1 32.0 - 36.0 g/dL   RDW 20.1 (H) 11.5 - 14.5 %   Platelets 413 150 - 440 K/uL  Comprehensive metabolic panel     Status: Abnormal   Collection Time: 05/26/16  5:07 AM  Result Value Ref Range   Sodium 136 135 - 145 mmol/L   Potassium 4.3 3.5 - 5.1 mmol/L   Chloride 102 101 - 111 mmol/L   CO2 24 22 - 32 mmol/L   Glucose, Bld 145 (H) 65 - 99 mg/dL   BUN 25 (H) 6 - 20 mg/dL   Creatinine, Ser 0.80 0.44 - 1.00 mg/dL   Calcium 9.4 8.9 - 10.3 mg/dL   Total Protein 7.2 6.5 - 8.1 g/dL   Albumin 3.4 (L) 3.5 - 5.0 g/dL   AST 21 15 - 41 U/L   ALT 15 14 - 54 U/L   Alkaline Phosphatase 72 38 - 126 U/L   Total Bilirubin 0.9 0.3 - 1.2 mg/dL   GFR calc non Af Amer >60 >60 mL/min   GFR calc Af Amer >60 >60 mL/min    Comment: (NOTE) The eGFR has been calculated using the CKD EPI equation. This calculation has not been validated in all clinical situations. eGFR's persistently <60 mL/min signify possible Chronic Kidney Disease.  Anion gap 10 5 - 15  Urinalysis, Complete w Microscopic     Status: Abnormal   Collection Time: 05/26/16  5:31 AM  Result Value  Ref Range   Color, Urine STRAW (A) YELLOW   APPearance CLEAR (A) CLEAR   Specific Gravity, Urine 1.011 1.005 - 1.030   pH 6.0 5.0 - 8.0   Glucose, UA 50 (A) NEGATIVE mg/dL   Hgb urine dipstick NEGATIVE NEGATIVE   Bilirubin Urine NEGATIVE NEGATIVE   Ketones, ur 5 (A) NEGATIVE mg/dL   Protein, ur NEGATIVE NEGATIVE mg/dL   Nitrite NEGATIVE NEGATIVE   Leukocytes, UA NEGATIVE NEGATIVE   RBC / HPF 0-5 0 - 5 RBC/hpf   WBC, UA 0-5 0 - 5 WBC/hpf   Bacteria, UA NONE SEEN NONE SEEN   Squamous Epithelial / LPF 0-5 (A) NONE SEEN   Hyaline Casts, UA PRESENT   Ammonia     Status: Abnormal   Collection Time: 05/26/16  5:31 AM  Result Value Ref Range   Ammonia 46 (H) 9 - 35 umol/L    Comment: HEMOLYSIS AT THIS LEVEL MAY AFFECT RESULT  Glucose, capillary     Status: Abnormal   Collection Time: 05/26/16  9:17 AM  Result Value Ref Range   Glucose-Capillary 193 (H) 65 - 99 mg/dL  Influenza panel by PCR (type A & B)     Status: None   Collection Time: 05/26/16  9:57 AM  Result Value Ref Range   Influenza A By PCR NEGATIVE NEGATIVE   Influenza B By PCR NEGATIVE NEGATIVE    Comment: (NOTE) The Xpert Xpress Flu assay is intended as an aid in the diagnosis of  influenza and should not be used as a sole basis for treatment.  This  assay is FDA approved for nasopharyngeal swab specimens only. Nasal  washings and aspirates are unacceptable for Xpert Xpress Flu testing.   Glucose, capillary     Status: Abnormal   Collection Time: 05/26/16 11:21 AM  Result Value Ref Range   Glucose-Capillary 166 (H) 65 - 99 mg/dL  Glucose, capillary     Status: Abnormal   Collection Time: 05/26/16  5:36 PM  Result Value Ref Range   Glucose-Capillary 158 (H) 65 - 99 mg/dL  Gastrointestinal Panel by PCR , Stool     Status: None   Collection Time: 05/26/16  6:14 PM  Result Value Ref Range   Campylobacter species NOT DETECTED NOT DETECTED   Plesimonas shigelloides NOT DETECTED NOT DETECTED   Salmonella species NOT  DETECTED NOT DETECTED   Yersinia enterocolitica NOT DETECTED NOT DETECTED   Vibrio species NOT DETECTED NOT DETECTED   Vibrio cholerae NOT DETECTED NOT DETECTED   Enteroaggregative E coli (EAEC) NOT DETECTED NOT DETECTED   Enteropathogenic E coli (EPEC) NOT DETECTED NOT DETECTED   Enterotoxigenic E coli (ETEC) NOT DETECTED NOT DETECTED   Shiga like toxin producing E coli (STEC) NOT DETECTED NOT DETECTED   Shigella/Enteroinvasive E coli (EIEC) NOT DETECTED NOT DETECTED   Cryptosporidium NOT DETECTED NOT DETECTED   Cyclospora cayetanensis NOT DETECTED NOT DETECTED   Entamoeba histolytica NOT DETECTED NOT DETECTED   Giardia lamblia NOT DETECTED NOT DETECTED   Adenovirus F40/41 NOT DETECTED NOT DETECTED   Astrovirus NOT DETECTED NOT DETECTED   Norovirus GI/GII NOT DETECTED NOT DETECTED   Rotavirus A NOT DETECTED NOT DETECTED   Sapovirus (I, II, IV, and V) NOT DETECTED NOT DETECTED   Ct Head Wo Contrast  Result Date: 05/26/2016 CLINICAL DATA:  Initial evaluation for sudden onset of confusion. EXAM: CT HEAD WITHOUT CONTRAST TECHNIQUE: Contiguous axial images were obtained from the base of the skull through the vertex without intravenous contrast. COMPARISON:  None. FINDINGS: Brain: Mild age-related cerebral atrophy with chronic small vessel ischemic disease. No acute intracranial hemorrhage. No evidence for acute large vessel territory infarct. No mass lesion, midline shift or mass effect. No hydrocephalus. No extra-axial fluid collection. Vascular: No hyperdense vessel. Vascular calcifications noted within the carotid siphons. Skull: Scalp soft tissues within normal limits.  Calvarium intact. Sinuses/Orbits: Globes and orbital soft tissues within normal limits. Patient is status post lens extraction bilaterally. Paranasal sinuses and mastoid air cells are clear. IMPRESSION: 1. No acute intracranial process identified. 2. Mild age-related cerebral atrophy with chronic small vessel ischemic disease.  Electronically Signed   By: Jeannine Boga M.D.   On: 05/26/2016 05:36   Mr Brain Wo Contrast  Result Date: 05/26/2016 CLINICAL DATA:  Altered mental status.  History ovarian cancer EXAM: MRI HEAD WITHOUT CONTRAST TECHNIQUE: Multiplanar, multiecho pulse sequences of the brain and surrounding structures were obtained without intravenous contrast. COMPARISON:  CT head 05/26/2016 FINDINGS: Brain: Several small areas of restricted diffusion in the occipital and parietal lobes bilaterally consistent with acute infarct. Small area of restricted diffusion also in the left lateral cerebellum. Probable posterior circulation emboli. Generalized atrophy. Mild chronic microvascular ischemic change in the white matter. Negative for hemorrhage or mass. Negative for hydrocephalus. Vascular: Normal arterial flow voids. Skull and upper cervical spine: Negative Sinuses/Orbits: Mild mucosal edema in the paranasal sinuses. Bilateral lens replacement. Other: None IMPRESSION: Multiple small areas of acute infarct involving the occipital parietal lobe bilaterally and the left lateral cerebellum, consistent with posterior circulation emboli Atrophy and mild chronic microvascular ischemia. Electronically Signed   By: Franchot Gallo M.D.   On: 05/26/2016 13:17    Assessment:  The patient is a 71 y.o. woman with stage IV ovarian cancer currently day 3 s/p cycle #4 carboplatin and gemcitabine.  She was admitted with acute confusion secondary to acute infarcts noted on head MRI.  Confusion has resolved.  She has baseline leukopenia secondary to rheumatoid arthritis.  She received Neupogen on day 2 s/p chemotherapy.  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  Lovenox was switched to Eliquis on 04/16/2016.  She has diabetes and is on an insulin pump.  Plan:   1.  Oncology:  She is currently day 3 s/p cycle #4 carboplatin and gemcitabine.  She received Granix (Neupogen) on day  2.  She will have a transient increase in counts.  Anticipate need for continued Neupogen secondary to baseline low counts from her rheumatoid arthritis.  Follow CBC daily.  Ovarian cancer is responding to treatment with CA125 decreasing from 220.8 to 76.  Restaging studies are planned prior to her next cycle.  Clinically, she has improved.  2.  Hematology:  Follow CBC daily.  Patient on Eliquis for history of jugular vein and subclavian vein thrombosis.  Anemia of chronic disease and chemotherapy induced anemia.  3.  Neurology:  Multiple small areas of acute infarcts.  Bilateral carotid duplex study pending.  Anticipate echocardiogram.  Neurology consult.   Thank you for allowing me to participate in [name] 's care.  I will follow her closely with you while hospitalized and after discharge in the outpatient department.  I will be out of the office tomorrow.  Dr. Delight Hoh will be covering in my  absence.   Lequita Asal, MD  05/26/2016, 9:24 PM

## 2016-05-27 ENCOUNTER — Inpatient Hospital Stay: Payer: Medicare Other

## 2016-05-27 ENCOUNTER — Inpatient Hospital Stay (HOSPITAL_COMMUNITY)
Admit: 2016-05-27 | Discharge: 2016-05-27 | Disposition: A | Discharge status: Home / Self Care | Payer: Medicare Other | Attending: Internal Medicine | Admitting: Internal Medicine

## 2016-05-27 ENCOUNTER — Encounter: Payer: Self-pay | Admitting: Radiology

## 2016-05-27 DIAGNOSIS — I6789 Other cerebrovascular disease: Secondary | ICD-10-CM

## 2016-05-27 DIAGNOSIS — L8990 Pressure ulcer of unspecified site, unspecified stage: Secondary | ICD-10-CM | POA: Insufficient documentation

## 2016-05-27 LAB — CBC
HCT: 22.7 % — ABNORMAL LOW (ref 35.0–47.0)
Hemoglobin: 8 g/dL — ABNORMAL LOW (ref 12.0–16.0)
MCH: 32.5 pg (ref 26.0–34.0)
MCHC: 35.2 g/dL (ref 32.0–36.0)
MCV: 92.1 fL (ref 80.0–100.0)
PLATELETS: 326 10*3/uL (ref 150–440)
RBC: 2.47 MIL/uL — AB (ref 3.80–5.20)
RDW: 19.7 % — AB (ref 11.5–14.5)
WBC: 7.5 10*3/uL (ref 3.6–11.0)

## 2016-05-27 LAB — BASIC METABOLIC PANEL
Anion gap: 6 (ref 5–15)
BUN: 18 mg/dL (ref 6–20)
CALCIUM: 8.7 mg/dL — AB (ref 8.9–10.3)
CHLORIDE: 104 mmol/L (ref 101–111)
CO2: 27 mmol/L (ref 22–32)
CREATININE: 0.6 mg/dL (ref 0.44–1.00)
GFR calc non Af Amer: >60 mL/min (ref >60)
Glucose, Bld: 155 mg/dL — ABNORMAL HIGH (ref 65–99)
Potassium: 3.9 mmol/L (ref 3.5–5.1)
Sodium: 137 mmol/L (ref 135–145)

## 2016-05-27 LAB — GLUCOSE, CAPILLARY
GLUCOSE-CAPILLARY: 160 mg/dL — AB (ref 65–99)
GLUCOSE-CAPILLARY: 87 mg/dL (ref 65–99)
GLUCOSE-CAPILLARY: 278 mg/dL — AB (ref 65–99)
Glucose-Capillary: 107 mg/dL — ABNORMAL HIGH (ref 65–99)
Glucose-Capillary: 157 mg/dL — ABNORMAL HIGH (ref 65–99)

## 2016-05-27 LAB — ECHOCARDIOGRAM COMPLETE
HEIGHTINCHES: 63 in
WEIGHTICAEL: 2292.8 [oz_av]

## 2016-05-27 MED ORDER — ZINC SULFATE 220 (50 ZN) MG PO CAPS
220.0000 mg | ORAL_CAPSULE | Freq: Every day | ORAL | Status: DC
  Administered 2016-05-27 – 2016-05-28 (×2): 220 mg via ORAL
  Filled 2016-05-27 (×2): qty 1

## 2016-05-27 MED ORDER — PREDNISONE 5 MG PO TABS
5.0000 mg | ORAL_TABLET | Freq: Every day | ORAL | Status: DC
  Administered 2016-05-28: 5 mg via ORAL
  Filled 2016-05-27: qty 1

## 2016-05-27 MED ORDER — IOPAMIDOL (ISOVUE-370) INJECTION 76%
75.0000 mL | Freq: Once | INTRAVENOUS | Status: AC | PRN
  Administered 2016-05-27: 17:00:00 75 mL via INTRAVENOUS

## 2016-05-27 NOTE — Progress Notes (Signed)
Inpatient Diabetes Program Recommendations  AACE/ADA: New Consensus Statement on Inpatient Glycemic Control (2015)  Target Ranges:  Prepandial:   less than 140 mg/dL      Peak postprandial:   less than 180 mg/dL (1-2 hours)      Critically ill patients:  140 - 180 mg/dL   Lab Results  Component Value Date   GLUCAP 107 (H) 05/27/2016   HGBA1C 7.1 (A) 10/17/2014    Review of Glycemic Control  Diabetes history: Type 1 diabetes.   Outpatient Diabetes medications:  She has a MiniMed Medtronic Paradigm 523 insulin pump. Last seen by Dr. Gabriel Carina on 03/11/16.  Basal rate  12:00 a.m. 0.5 units an hour 4:00 a.m. 0.625 units an hour 5:00 am 1.1 units an hour 11 am 1.3 units an hour 3:00 p.m. 0.5 units an hour total daily basal rate of 18.925 units.  Bolus settings 1:C ratio 12 AM 1:8.5, 4 PM 1:7 Sensitivity 46 Target 120-120 Active insulin time 4 hours   Inpatient Diabetes Program Recommendations:  Dr. Gabriel Carina wants to be contacted if the patient is placed on steroids so a temporary basal rate can be programed into the pump. (see notes dated 03/11/16).  I have spoke to the office this morning- she is willing to see her insulin pump patients if consulted.   The patient has not changed basal rates since she saw Dr. Gabriel Carina.  She is competent to manage her insulin pump and her husband has brought the supplies/insulin to change her site today.   Please d/c the Novolog correction scale- patient will administer correction insulin through her pump.  Gentry Fitz, RN, BA, MHA, CDE Diabetes Coordinator Inpatient Diabetes Program  [phone] (Team Pager) [phone] ([place]) 05/27/2016 10:32 AM

## 2016-05-27 NOTE — Progress Notes (Signed)
[place] [facility] [place] NAME: [name]    MR#:  [id number]  DATE OF BIRTH:  04-17-1946  SUBJECTIVE:  CHIEF COMPLAINT:   Chief Complaint  Patient presents with  . Altered Mental Status   Brought with altered mental status, slurred speech. Infection work up is negative. 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 Feels completely back to baseline now. No nausea.  REVIEW OF SYSTEMS:  CONSTITUTIONAL: No fever, fatigue or weakness.  EYES: No blurred or double vision.  EARS, NOSE, AND THROAT: No tinnitus or ear pain.  RESPIRATORY: No cough, shortness of breath, wheezing or hemoptysis.  CARDIOVASCULAR: No chest pain, orthopnea, edema.  GASTROINTESTINAL: No nausea, vomiting, diarrhea or abdominal pain.  GENITOURINARY: No dysuria, hematuria.  ENDOCRINE: No polyuria, nocturia,  HEMATOLOGY: No anemia, easy bruising or bleeding SKIN: No rash or lesion. MUSCULOSKELETAL: No joint pain or arthritis.   NEUROLOGIC: No tingling, numbness, weakness.  PSYCHIATRY: No anxiety or depression.   ROS  DRUG ALLERGIES:   Allergies  Allergen Reactions  . Codeine Nausea And Vomiting  . Latex Rash    VITALS:  Blood pressure (!) 140/49, pulse 81, temperature 97.5 F (36.4 C), temperature source Oral, resp. rate 18, height 5\' 3"  (1.6 m), weight 65 kg (143 lb 4.8 oz), SpO2 97 %.  PHYSICAL EXAMINATION:  GENERAL:  71 y.o.-year-old patient lying in the bed with no acute distress.  EYES: Pupils equal, round, reactive to light and accommodation. No scleral icterus. Extraocular muscles intact.  HEENT: Head atraumatic, normocephalic. Oropharynx and nasopharynx clear.  NECK:  Supple, no jugular venous distention. No thyroid enlargement, no tenderness.  LUNGS: Normal breath sounds bilaterally, no wheezing, rales,rhonchi or crepitation. No use of accessory muscles of respiration.  CARDIOVASCULAR: S1, S2 normal. No murmurs, rubs, or gallops.  ABDOMEN: Soft,  nontender, nondistended. Bowel sounds present. No organomegaly or mass. Colostomy in place. EXTREMITIES: No pedal edema, cyanosis, or clubbing.  NEUROLOGIC: Cranial nerves II through XII are intact. Muscle strength 5/5 in all extremities. Sensation intact. Gait not checked.  PSYCHIATRIC: The patient is alert and oriented x 3.  SKIN: No obvious rash, lesion, or ulcer.   Physical Exam LABORATORY PANEL:   CBC  Recent Labs Lab 05/27/16 0401  WBC 7.5  HGB 8.0*  HCT 22.7*  PLT 326   ------------------------------------------------------------------------------------------------------------------  Chemistries   Recent Labs Lab 05/24/16 0855 05/26/16 0507 05/27/16 0401  NA 135 136 137  K 4.2 4.3 3.9  CL 104 102 104  CO2 25 24 27   GLUCOSE 183* 145* 155*  BUN 22* 25* 18  CREATININE 0.72 0.80 0.60  CALCIUM 8.8* 9.4 8.7*  MG 1.7  --   --   AST 26 21  --   ALT 18 15  --   ALKPHOS 71 72  --   BILITOT 0.3 0.9  --    ------------------------------------------------------------------------------------------------------------------  Cardiac Enzymes No results for input(s): TROPONINI in the last 168 hours. ------------------------------------------------------------------------------------------------------------------  RADIOLOGY:  Ct Angio Head W Or Wo Contrast  Result Date: 05/27/2016 CLINICAL DATA:  71 y/o  F; confusion and slurred speech. EXAM: CT ANGIOGRAPHY HEAD AND NECK TECHNIQUE: Multidetector CT imaging of the head and neck was performed using the standard protocol during bolus administration of intravenous contrast. Multiplanar CT image reconstructions and MIPs were obtained to evaluate the vascular anatomy. Carotid stenosis measurements (when applicable) are obtained utilizing NASCET criteria, using the distal internal carotid diameter as the denominator. CONTRAST:  75 cc Isovue 370  COMPARISON:  05/26/2016 carotid ultrasound and 05/26/2016 MRI head. FINDINGS: CT HEAD  FINDINGS Brain: No evidence of acute infarction, hemorrhage, hydrocephalus, extra-axial collection or mass lesion/mass effect. Small cortical infarcts on the prior MRI are not appreciable on CT. Stable background of chronic microvascular ischemic changes and parenchymal volume loss of the brain. Vascular: As below. Skull: Normal. Negative for fracture or focal lesion. Sinuses: Imaged portions are clear. Orbits: No acute finding. Review of the MIP images confirms the above findings CTA NECK FINDINGS Aortic arch: Standard branching. Imaged portion shows no evidence of aneurysm or dissection. No significant stenosis of the major arch vessel origins. Mild calcific atherosclerosis. Right carotid system: No evidence of dissection, stenosis (50% or greater) or occlusion. Dense calcified plaque of the bifurcation with mild 40% proximal ICA stenosis. Left carotid system: No evidence of dissection or occlusion. Dense calcified plaque of the bifurcation with moderate 60% proximal ICA stenosis. Vertebral arteries: Codominant. No evidence of dissection, stenosis (50% or greater) or occlusion. Calcified plaque of V3/4 junctions bilaterally with moderate stenosis. Skeleton: Mild cervical spondylosis. No high-grade bony canal stenosis or foraminal narrowing. Other neck: 15 mm left lobe of thyroid nodule with coarse calcification. Upper chest: Left upper lobe pulmonary nodules measuring 4 mm (series 7, image 26 an 17). Scattered mucous plugging. Review of the MIP images confirms the above findings CTA HEAD FINDINGS Anterior circulation: No significant stenosis, proximal occlusion, aneurysm, or vascular malformation. Severe calcification of cavernous and paraclinoid internal carotid arteries with mild-to-moderate paraclinoid stenosis. Posterior circulation: No significant stenosis, proximal occlusion, aneurysm, or vascular malformation. Venous sinuses: As permitted by contrast timing, patent. Anatomic variants: Patent anterior  communicating artery, diminutive right posterior communicating artery, and moderate left posterior communicating artery. Delayed phase: No abnormal intracranial enhancement. Review of the MIP images confirms the above findings IMPRESSION: 1. Right proximal ICA mild 40% stenosis due to calcified plaque. 2. Left proximal ICA moderate 60% stenosis due to calcified plaque. 3. Bilateral vertebral artery V3/V4 junction calcified plaque with moderate stenosis. 4. Bilateral ICA cavernous and paraclinoid severe calcific atherosclerosis with mild-to-moderate paraclinoid stenosis. 5. Otherwise no significant stenosis, proximal occlusion, aneurysm, or vascular malformation of circle of Willis. 6. Mild aortic atherosclerosis. 7. 15 mm left thyroid lobe nodule, this can be further characterized with thyroid ultrasound on a nonemergent basis. 8. Left upper lobe pulmonary nodules measuring up to 4 mm. No follow-up needed if patient is low-risk (and has no known or suspected primary neoplasm). Non-contrast chest CT can be considered in 12 months if patient is high-risk. This recommendation follows the consensus statement: Guidelines for Management of Incidental Pulmonary Nodules Detected on CT Images: From the Fleischner Society 2017; Radiology 2017; 284:228-243. Electronically Signed   By: Kristine Garbe M.D.   On: 05/27/2016 18:09   Ct Head Wo Contrast  Result Date: 05/26/2016 CLINICAL DATA:  Initial evaluation for sudden onset of confusion. EXAM: CT HEAD WITHOUT CONTRAST TECHNIQUE: Contiguous axial images were obtained from the base of the skull through the vertex without intravenous contrast. COMPARISON:  None. FINDINGS: Brain: Mild age-related cerebral atrophy with chronic small vessel ischemic disease. No acute intracranial hemorrhage. No evidence for acute large vessel territory infarct. No mass lesion, midline shift or mass effect. No hydrocephalus. No extra-axial fluid collection. Vascular: No hyperdense  vessel. Vascular calcifications noted within the carotid siphons. Skull: Scalp soft tissues within normal limits.  Calvarium intact. Sinuses/Orbits: Globes and orbital soft tissues within normal limits. Patient is status post lens extraction bilaterally. Paranasal sinuses and mastoid air  cells are clear. IMPRESSION: 1. No acute intracranial process identified. 2. Mild age-related cerebral atrophy with chronic small vessel ischemic disease. Electronically Signed   By: Jeannine Boga M.D.   On: 05/26/2016 05:36   Ct Angio Neck W Or Wo Contrast  Result Date: 05/27/2016 CLINICAL DATA:  70 y/o  F; confusion and slurred speech. EXAM: CT ANGIOGRAPHY HEAD AND NECK TECHNIQUE: Multidetector CT imaging of the head and neck was performed using the standard protocol during bolus administration of intravenous contrast. Multiplanar CT image reconstructions and MIPs were obtained to evaluate the vascular anatomy. Carotid stenosis measurements (when applicable) are obtained utilizing NASCET criteria, using the distal internal carotid diameter as the denominator. CONTRAST:  75 cc Isovue 370 COMPARISON:  05/26/2016 carotid ultrasound and 05/26/2016 MRI head. FINDINGS: CT HEAD FINDINGS Brain: No evidence of acute infarction, hemorrhage, hydrocephalus, extra-axial collection or mass lesion/mass effect. Small cortical infarcts on the prior MRI are not appreciable on CT. Stable background of chronic microvascular ischemic changes and parenchymal volume loss of the brain. Vascular: As below. Skull: Normal. Negative for fracture or focal lesion. Sinuses: Imaged portions are clear. Orbits: No acute finding. Review of the MIP images confirms the above findings CTA NECK FINDINGS Aortic arch: Standard branching. Imaged portion shows no evidence of aneurysm or dissection. No significant stenosis of the major arch vessel origins. Mild calcific atherosclerosis. Right carotid system: No evidence of dissection, stenosis (50% or greater) or  occlusion. Dense calcified plaque of the bifurcation with mild 40% proximal ICA stenosis. Left carotid system: No evidence of dissection or occlusion. Dense calcified plaque of the bifurcation with moderate 60% proximal ICA stenosis. Vertebral arteries: Codominant. No evidence of dissection, stenosis (50% or greater) or occlusion. Calcified plaque of V3/4 junctions bilaterally with moderate stenosis. Skeleton: Mild cervical spondylosis. No high-grade bony canal stenosis or foraminal narrowing. Other neck: 15 mm left lobe of thyroid nodule with coarse calcification. Upper chest: Left upper lobe pulmonary nodules measuring 4 mm (series 7, image 26 an 17). Scattered mucous plugging. Review of the MIP images confirms the above findings CTA HEAD FINDINGS Anterior circulation: No significant stenosis, proximal occlusion, aneurysm, or vascular malformation. Severe calcification of cavernous and paraclinoid internal carotid arteries with mild-to-moderate paraclinoid stenosis. Posterior circulation: No significant stenosis, proximal occlusion, aneurysm, or vascular malformation. Venous sinuses: As permitted by contrast timing, patent. Anatomic variants: Patent anterior communicating artery, diminutive right posterior communicating artery, and moderate left posterior communicating artery. Delayed phase: No abnormal intracranial enhancement. Review of the MIP images confirms the above findings IMPRESSION: 1. Right proximal ICA mild 40% stenosis due to calcified plaque. 2. Left proximal ICA moderate 60% stenosis due to calcified plaque. 3. Bilateral vertebral artery V3/V4 junction calcified plaque with moderate stenosis. 4. Bilateral ICA cavernous and paraclinoid severe calcific atherosclerosis with mild-to-moderate paraclinoid stenosis. 5. Otherwise no significant stenosis, proximal occlusion, aneurysm, or vascular malformation of circle of Willis. 6. Mild aortic atherosclerosis. 7. 15 mm left thyroid lobe nodule, this can be  further characterized with thyroid ultrasound on a nonemergent basis. 8. Left upper lobe pulmonary nodules measuring up to 4 mm. No follow-up needed if patient is low-risk (and has no known or suspected primary neoplasm). Non-contrast chest CT can be considered in 12 months if patient is high-risk. This recommendation follows the consensus statement: Guidelines for Management of Incidental Pulmonary Nodules Detected on CT Images: From the Fleischner Society 2017; Radiology 2017; 284:228-243. Electronically Signed   By: Kristine Garbe M.D.   On: 05/27/2016 18:09   Mr  Brain Wo Contrast  Result Date: 05/26/2016 CLINICAL DATA:  Altered mental status.  History ovarian cancer EXAM: MRI HEAD WITHOUT CONTRAST TECHNIQUE: Multiplanar, multiecho pulse sequences of the brain and surrounding structures were obtained without intravenous contrast. COMPARISON:  CT head 05/26/2016 FINDINGS: Brain: Several small areas of restricted diffusion in the occipital and parietal lobes bilaterally consistent with acute infarct. Small area of restricted diffusion also in the left lateral cerebellum. Probable posterior circulation emboli. Generalized atrophy. Mild chronic microvascular ischemic change in the white matter. Negative for hemorrhage or mass. Negative for hydrocephalus. Vascular: Normal arterial flow voids. Skull and upper cervical spine: Negative Sinuses/Orbits: Mild mucosal edema in the paranasal sinuses. Bilateral lens replacement. Other: None IMPRESSION: Multiple small areas of acute infarct involving the occipital parietal lobe bilaterally and the left lateral cerebellum, consistent with posterior circulation emboli Atrophy and mild chronic microvascular ischemia. Electronically Signed   By: Franchot Gallo M.D.   On: 05/26/2016 13:17   US Carotid Bilateral  Result Date: 05/27/2016 CLINICAL DATA:  Cerebral infarction EXAM: BILATERAL CAROTID DUPLEX ULTRASOUND TECHNIQUE: Pearline Cables scale imaging, color Doppler and  duplex ultrasound were performed of bilateral carotid and vertebral arteries in the neck. COMPARISON:  None. FINDINGS: Criteria: Quantification of carotid stenosis is based on velocity parameters that correlate the residual internal carotid diameter with NASCENT-based stenosis levels, using the diameter of the distal internal carotid lumen as the denominator for stenosis measurement. The following velocity measurements were obtained: RIGHT ICA:  152 cm/sec CCA:  99991111 cm/sec SYSTOLIC ICA/CCA RATIO:  1.3 DIASTOLIC ICA/CCA RATIO:  1.7 ECA:  161 cm/sec LEFT ICA:  207 cm/sec CCA:  123456 cm/sec SYSTOLIC ICA/CCA RATIO:  1.8 DIASTOLIC ICA/CCA RATIO:  2.6 ECA:  208 cm/sec RIGHT CAROTID ARTERY: Mild calcified plaque along the wall of the bulb. Low resistance internal carotid Doppler pattern is preserved. The lumen of the bulb appears patent on color Doppler imaging. RIGHT VERTEBRAL ARTERY:  Antegrade. LEFT CAROTID ARTERY: Moderate calcified plaque is present in the left bulb. This shadows the lumen somewhat. Low resistance internal carotid Doppler pattern is preserved. LEFT VERTEBRAL ARTERY:  Antegrade. IMPRESSION: Less than 50% stenosis in the right internal carotid artery. 50-69% stenosis in the left internal carotid artery. Electronically Signed   By: Marybelle Killings M.D.   On: 05/27/2016 09:36    ASSESSMENT AND PLAN:   Principal Problem:   Altered mental status Active Problems:   Acute embolic stroke (HCC)   Pressure injury of skin   * Acute embolic strokes- multi.   Source posterior circulation.   Neuro consult.   Echo, Carotid doppler, CTA head and neck, PT eval.   Pt is already on Eliquis.   Started on statin.  * cancer, mets , chemo,    Oncology consult,s he received neupogen as per husband -   * Hypertension   Now coming under control, Cont home meds.  * DM   Keep on ISS.  * Hx of DVT   On eliquis, cont.  * Ch diastolic CHF   On lasix at home, cont.    All the records are reviewed  and case discussed with Care Management/Social Workerr. Management plans discussed with the patient, family and they are in agreement.  CODE STATUS: Full  TOTAL TIME TAKING CARE OF THIS PATIENT: 35 minutes.     POSSIBLE D/C IN 1-2 DAYS, DEPENDING ON CLINICAL CONDITION.   Vaughan Basta M.D on 05/27/2016   Between 7am to 6pm - Pager - [phone]  After 6pm go to  [email or web address] - password [email or web address] [facility]  Office  [phone]  CC: Primary care physician; Tommi Rumps, MD  Note: This dictation was prepared with Dragon dictation along with smaller phrase technology. Any transcriptional errors that result from this process are unintentional.

## 2016-05-27 NOTE — Consult Note (Addendum)
Referring Physician: Anselm Jungling    Chief Complaint: Headache, N/V and confusion  HPI: [name] is an 71 y.o. female with a known history of ovarian cancer with metastasis on chemotherapy who woke up on Thursday very confused and had word finding difficulties.  She noted slurred speech and headache as well. She had 4-5 total episodes of vomiting  since then, BP was elevated.   Initial NIHSS of 1.  Date last known well: Date: 05/25/2016 Time last known well: Time: 21:00 tPA Given: No: Outside time window  Past Medical History:  Diagnosis Date  . CAD (coronary artery disease)    a. 09/2012 Cath: LM nl, LAD 95p, 36m LCX 970mOM2 50, RCA 100.  . Marland Kitchenholelithiasis   . Chronic diastolic CHF (congestive heart failure) ([facility]  . Collagen vascular disease ([facility]  . Esophageal stricture   . Exertional shortness of breath   . GERD (gastroesophageal reflux disease)   . H/O hiatal hernia   . Herniated disc   . History of pancytopenia   . Hyperlipidemia   . Hypertension   . Hypokalemia   . Leukopenia 2012   s/p bone marrow biopsy, Dr. PaMa Hillock. NSTEMI (non-ST elevated myocardial infarction) ([facility] [place]/2014   "mild" (10/18/2012)  . Ovarian cancer ([facility]   chemo  . Pneumonia 2013; 08/2012   "one lung; double" (10/18/2012)  . Rheumatoid arthritis(714.0)   . Type I diabetes mellitus ([facility]   "dx'd in 1957" (10/18/2012)    Past Surgical History:  Procedure Laterality Date  . ABDOMINAL HYSTERECTOMY  06/15/2015   Dx L/S, EXLAP TAH BSO omentectomy RSRx colostomy diaphragm resection stripping  . CARDIAC CATHETERIZATION  10/18/2012   "first one was today" (10/18/2012)  . CATARACT EXTRACTION W/ INTRAOCULAR LENS IMPLANT Right 2010  . CHOLECYSTECTOMY  06/15/2015   combined case with ovarian cancer debulking  . COLON SURGERY    . CORONARY ARTERY BYPASS GRAFT N/A 10/19/2012   Procedure: CORONARY ARTERY BYPASS GRAFTING (CABG);  Surgeon: StMelrose NakayamaMD;  [place] [facility] Service: Open Heart  Surgery;  Laterality: N/A;  . ESOPHAGEAL DILATION     "3 or 4 times" (10/19/2011)  . ESOPHAGOGASTRODUODENOSCOPY  2012   Dr. IfMinna Merritts. OSTOMY    . OVARY SURGERY     removal  . PERIPHERAL VASCULAR CATHETERIZATION N/A 03/02/2015   Procedure: PoGlori Luisath Insertion;  Surgeon: JaAlgernon HuxleyMD;  [facility];  Service: Cardiovascular;  Laterality: N/A;  . TUBAL LIGATION  1970    Family History  Problem Relation Age of Onset  . Diabetes Mother   . Arthritis Mother   . Diabetes Father   . Arthritis Father   . Bone cancer Sister    Social History:  reports that she quit smoking about 21 years ago. Her smoking use included Cigarettes. She has a 30.00 pack-year smoking history. She has never used smokeless tobacco. She reports that she does not drink alcohol or use drugs.  Allergies:  Allergies  Allergen Reactions  . Codeine Nausea And Vomiting  . Latex Rash    Medications:  I have reviewed the patient's current medications. Prior to Admission:  Prescriptions Prior to Admission  Medication Sig Dispense Refill Last Dose  . Calcium-Vitamin D 600-200 MG-UNIT tablet Take 2 tablets by mouth daily.    unknown at unknown  . ELIQUIS 5 MG TABS tablet TAKE 1 TABLET BY MOUTH TWICE A DAY 60 tablet 0 05/25/2016 at Unknown time  .  enoxaparin (LOVENOX) 60 MG/0.6ML injection Inject 60 mg into the skin every 12 (twelve) hours.   unknown at unknown  . folic acid (FOLVITE) 1 MG tablet TAKE 1 TABLET BY MOUTH ONCE A DAY 90 tablet 3 05/25/2016 at Unknown time  . insulin lispro (HUMALOG) 100 UNIT/ML injection Inject 0.06 mLs (6 Units total) into the skin daily. (Patient taking differently: Inject 0-85 Units into the skin daily. Via insulin pump) 30 mL 3 unknown at unknown  . lidocaine-prilocaine (EMLA) cream Apply 1 application topically as needed (prior to accessing port).   prn at prn  . loratadine (CLARITIN) 10 MG tablet Take 1 tablet by mouth daily as needed.   unknown at unknown  .  losartan (COZAAR) 100 MG tablet Take 1 tablet (100 mg total) by mouth daily. 90 tablet 1 05/25/2016 at Unknown time  . magnesium oxide (MAG-OX) 400 (241.3 Mg) MG tablet Take 1 tablet (400 mg total) by mouth daily. 30 tablet 2 05/25/2016 at unknown  . metoprolol tartrate (LOPRESSOR) 25 MG tablet Take 1 tablet (25 mg total) by mouth 2 (two) times daily. 180 tablet 3 05/25/2016 at Unknown time  . ondansetron (ZOFRAN) 8 MG tablet TAKE 1 TABLET BY MOUTH EVERY 8 HOURS AS NEEDED FOR NAUSEA OR VOMITING 30 tablet 1 prn at prn  . pantoprazole (PROTONIX) 40 MG tablet TAKE 1 TABLET BY MOUTH TWICE A DAY 180 tablet 1 05/25/2016 at Unknown time  . potassium chloride SA (K-DUR,KLOR-CON) 20 MEQ tablet Take 1 tablet (20 mEq total) by mouth daily. (Patient taking differently: Take 40 mEq by mouth daily. ) 20 tablet 0 05/25/2016 at Unknown time  . predniSONE (DELTASONE) 5 MG tablet Take 5 mg by mouth daily with breakfast.   05/25/2016 at Unknown time  . ranitidine (ZANTAC) 150 MG tablet Take 1 tablet (150 mg total) by mouth 2 (two) times daily. 180 tablet 1 05/25/2016 at unknown  . vitamin C (ASCORBIC ACID) 500 MG tablet Take 500 mg by mouth daily.   unknown at unknown  . zinc gluconate 50 MG tablet Take 50 mg by mouth daily.   unknown at unknown  . ONE TOUCH ULTRA TEST test strip    Taking   Scheduled: . apixaban  5 mg Oral BID  . atorvastatin  40 mg Oral q1800  . calcium-vitamin D  2 tablet Oral Daily  . feeding supplement (GLUCERNA SHAKE)  237 mL Oral TID BM  . folic acid  1 mg Oral Daily  . furosemide  40 mg Oral QODAY  . insulin pump   Subcutaneous TID AC, HS, 0200  . losartan  100 mg Oral Daily  . magnesium oxide  400 mg Oral Daily  . metoprolol tartrate  25 mg Oral BID  . pantoprazole  40 mg Oral BID  . potassium chloride SA  20 mEq Oral Daily  . [START ON 05/28/2016] predniSONE  5 mg Oral Q breakfast  . sodium chloride flush  3 mL Intravenous Q12H  . vitamin C  500 mg Oral Daily  . zinc sulfate  220 mg Oral  Daily    ROS: History obtained from the patient  General ROS: negative for - chills, fatigue, fever, night sweats, weight gain or weight loss Psychological ROS: negative for - behavioral disorder, hallucinations, memory difficulties, mood swings or suicidal ideation Ophthalmic ROS: negative for - blurry vision, double vision, eye pain or loss of vision ENT ROS: negative for - epistaxis, nasal discharge, oral lesions, sore throat, tinnitus or vertigo Allergy and  Immunology ROS: negative for - hives or itchy/watery eyes Hematological and Lymphatic ROS: negative for - bleeding problems, bruising or swollen lymph nodes Endocrine ROS: negative for - galactorrhea, hair pattern changes, polydipsia/polyuria or temperature intolerance Respiratory ROS: negative for - cough, hemoptysis, shortness of breath or wheezing Cardiovascular ROS: negative for - chest pain, dyspnea on exertion, edema or irregular heartbeat Gastrointestinal ROS: negative for - abdominal pain, diarrhea, hematemesis, nausea/vomiting or stool incontinence Genito-Urinary ROS: negative for - dysuria, hematuria, incontinence or urinary frequency/urgency Musculoskeletal ROS: negative for - joint swelling or muscular weakness Neurological ROS: as noted in HPI Dermatological ROS: negative for rash and skin lesion changes  Physical Examination: Blood pressure (!) 127/52, pulse 88, temperature 98.4 F (36.9 C), temperature source Oral, resp. rate 17, height 5' 3"  (1.6 m), weight 65 kg (143 lb 4.8 oz), SpO2 98 %.  HEENT-  Normocephalic, no lesions, without obvious abnormality.  Normal external eye and conjunctiva.  Normal TM's bilaterally.  Normal auditory canals and external ears. Normal external nose, mucus membranes and septum.  Normal pharynx. Cardiovascular- S1, S2 normal, pulses palpable throughout   Lungs- chest clear, no wheezing, rales, normal symmetric air entry Abdomen- soft, non-tender; bowel sounds normal; no masses,  no  organomegaly Extremities- mild LE edema Lymph-no adenopathy palpable Musculoskeletal-no joint tenderness, deformity or swelling Skin-warm and dry, no hyperpigmentation, vitiligo, or suspicious lesions  Neurological Examination Mental Status: Alert, oriented, thought content appropriate.  Speech fluent without evidence of aphasia.  Able to follow 3 step commands without difficulty. Cranial Nerves: II: Discs flat bilaterally; Visual fields grossly normal, pupils equal, round, reactive to light and accommodation III,IV, VI: ptosis not present, extra-ocular motions intact bilaterally V,VII: decreased right NLF, facial light touch sensation normal bilaterally VIII: hearing normal bilaterally IX,X: gag reflex present XI: bilateral shoulder shrug XII: midline tongue extension Motor: Right : Upper extremity   5/5    Left:     Upper extremity   5/5  Lower extremity   5/5     Lower extremity   5/5 Tone and bulk:normal tone throughout; no atrophy noted Sensory: Pinprick and light touch intact throughout, bilaterally Deep Tendon Reflexes: 1+ in the upper extremities and absent in the lower extremities Plantars: Right: mute   Left: mute Cerebellar: Normal finger-to-nose and normal heel-to-shin testing bilaterally Gait: not tested due to safety concerns    Laboratory Studies:  Basic Metabolic Panel:  Recent Labs Lab 05/24/16 0855 05/26/16 0507 05/27/16 0401  NA 135 136 137  K 4.2 4.3 3.9  CL 104 102 104  CO2 25 24 27   GLUCOSE 183* 145* 155*  BUN 22* 25* 18  CREATININE 0.72 0.80 0.60  CALCIUM 8.8* 9.4 8.7*  MG 1.7  --   --     Liver Function Tests:  Recent Labs Lab 05/24/16 0855 05/26/16 0507  AST 26 21  ALT 18 15  ALKPHOS 71 72  BILITOT 0.3 0.9  PROT 6.8 7.2  ALBUMIN 3.2* 3.4*   No results for input(s): LIPASE, AMYLASE in the last 168 hours.  Recent Labs Lab 05/26/16 0531  AMMONIA 46*    CBC:  Recent Labs Lab 05/24/16 0855 05/26/16 0507 05/27/16 0401   WBC 2.6* 16.2* 7.5  NEUTROABS 1.3*  --   --   HGB 8.4* 9.3* 8.0*  HCT 24.2* 26.5* 22.7*  MCV 91.5 91.5 92.1  PLT 324 413 326    Cardiac Enzymes: No results for input(s): CKTOTAL, CKMB, CKMBINDEX, TROPONINI in the last 168 hours.  BNP: Invalid  input(s): POCBNP  CBG:  Recent Labs Lab 05/27/16 0156 05/27/16 0757 05/27/16 1148 05/27/16 1700 05/27/16 2110  GLUCAP 160* 107* 157* 87 278*    Microbiology: Results for orders placed or performed during the hospital encounter of 05/26/16  Gastrointestinal Panel by PCR , Stool     Status: None   Collection Time: 05/26/16  6:14 PM  Result Value Ref Range Status   Campylobacter species NOT DETECTED NOT DETECTED Final   Plesimonas shigelloides NOT DETECTED NOT DETECTED Final   Salmonella species NOT DETECTED NOT DETECTED Final   Yersinia enterocolitica NOT DETECTED NOT DETECTED Final   Vibrio species NOT DETECTED NOT DETECTED Final   Vibrio cholerae NOT DETECTED NOT DETECTED Final   Enteroaggregative E coli (EAEC) NOT DETECTED NOT DETECTED Final   Enteropathogenic E coli (EPEC) NOT DETECTED NOT DETECTED Final   Enterotoxigenic E coli (ETEC) NOT DETECTED NOT DETECTED Final   Shiga like toxin producing E coli (STEC) NOT DETECTED NOT DETECTED Final   Shigella/Enteroinvasive E coli (EIEC) NOT DETECTED NOT DETECTED Final   Cryptosporidium NOT DETECTED NOT DETECTED Final   Cyclospora cayetanensis NOT DETECTED NOT DETECTED Final   Entamoeba histolytica NOT DETECTED NOT DETECTED Final   Giardia lamblia NOT DETECTED NOT DETECTED Final   Adenovirus F40/41 NOT DETECTED NOT DETECTED Final   Astrovirus NOT DETECTED NOT DETECTED Final   Norovirus GI/GII NOT DETECTED NOT DETECTED Final   Rotavirus A NOT DETECTED NOT DETECTED Final   Sapovirus (I, II, IV, and V) NOT DETECTED NOT DETECTED Final    Coagulation Studies: No results for input(s): LABPROT, INR in the last 72 hours.  Urinalysis:  Recent Labs Lab 05/26/16 0531  COLORURINE  STRAW*  LABSPEC 1.011  PHURINE 6.0  GLUCOSEU 50*  HGBUR NEGATIVE  BILIRUBINUR NEGATIVE  KETONESUR 5*  PROTEINUR NEGATIVE  NITRITE NEGATIVE  LEUKOCYTESUR NEGATIVE    Lipid Panel:    Component Value Date/Time   CHOL 166 04/08/2016 0751   CHOL 142 09/22/2012 0355   TRIG 90 04/08/2016 0751   TRIG 65 09/22/2012 0355   HDL 48 04/08/2016 0751   HDL 54 09/22/2012 0355   CHOLHDL 3.5 04/08/2016 0751   CHOLHDL 3 11/18/2014 0810   VLDL 12.8 11/18/2014 0810   VLDL 13 09/22/2012 0355   LDLCALC 100 (H) 04/08/2016 0751   LDLCALC 75 09/22/2012 0355    HgbA1C:  Lab Results  Component Value Date   HGBA1C 7.1 (A) 10/17/2014    Urine Drug Screen:  No results found for: LABOPIA, COCAINSCRNUR, LABBENZ, AMPHETMU, THCU, LABBARB  Alcohol Level: No results for input(s): ETH in the last 168 hours.   Imaging: Ct Angio Head W Or Wo Contrast  Result Date: 05/27/2016 CLINICAL DATA:  71 y/o  F; confusion and slurred speech. EXAM: CT ANGIOGRAPHY HEAD AND NECK TECHNIQUE: Multidetector CT imaging of the head and neck was performed using the standard protocol during bolus administration of intravenous contrast. Multiplanar CT image reconstructions and MIPs were obtained to evaluate the vascular anatomy. Carotid stenosis measurements (when applicable) are obtained utilizing NASCET criteria, using the distal internal carotid diameter as the denominator. CONTRAST:  75 cc Isovue 370 COMPARISON:  05/26/2016 carotid ultrasound and 05/26/2016 MRI head. FINDINGS: CT HEAD FINDINGS Brain: No evidence of acute infarction, hemorrhage, hydrocephalus, extra-axial collection or mass lesion/mass effect. Small cortical infarcts on the prior MRI are not appreciable on CT. Stable background of chronic microvascular ischemic changes and parenchymal volume loss of the brain. Vascular: As below. Skull: Normal. Negative for fracture  or focal lesion. Sinuses: Imaged portions are clear. Orbits: No acute finding. Review of the MIP  images confirms the above findings CTA NECK FINDINGS Aortic arch: Standard branching. Imaged portion shows no evidence of aneurysm or dissection. No significant stenosis of the major arch vessel origins. Mild calcific atherosclerosis. Right carotid system: No evidence of dissection, stenosis (50% or greater) or occlusion. Dense calcified plaque of the bifurcation with mild 40% proximal ICA stenosis. Left carotid system: No evidence of dissection or occlusion. Dense calcified plaque of the bifurcation with moderate 60% proximal ICA stenosis. Vertebral arteries: Codominant. No evidence of dissection, stenosis (50% or greater) or occlusion. Calcified plaque of V3/4 junctions bilaterally with moderate stenosis. Skeleton: Mild cervical spondylosis. No high-grade bony canal stenosis or foraminal narrowing. Other neck: 15 mm left lobe of thyroid nodule with coarse calcification. Upper chest: Left upper lobe pulmonary nodules measuring 4 mm (series 7, image 26 an 17). Scattered mucous plugging. Review of the MIP images confirms the above findings CTA HEAD FINDINGS Anterior circulation: No significant stenosis, proximal occlusion, aneurysm, or vascular malformation. Severe calcification of cavernous and paraclinoid internal carotid arteries with mild-to-moderate paraclinoid stenosis. Posterior circulation: No significant stenosis, proximal occlusion, aneurysm, or vascular malformation. Venous sinuses: As permitted by contrast timing, patent. Anatomic variants: Patent anterior communicating artery, diminutive right posterior communicating artery, and moderate left posterior communicating artery. Delayed phase: No abnormal intracranial enhancement. Review of the MIP images confirms the above findings IMPRESSION: 1. Right proximal ICA mild 40% stenosis due to calcified plaque. 2. Left proximal ICA moderate 60% stenosis due to calcified plaque. 3. Bilateral vertebral artery V3/V4 junction calcified plaque with moderate stenosis.  4. Bilateral ICA cavernous and paraclinoid severe calcific atherosclerosis with mild-to-moderate paraclinoid stenosis. 5. Otherwise no significant stenosis, proximal occlusion, aneurysm, or vascular malformation of circle of Willis. 6. Mild aortic atherosclerosis. 7. 15 mm left thyroid lobe nodule, this can be further characterized with thyroid ultrasound on a nonemergent basis. 8. Left upper lobe pulmonary nodules measuring up to 4 mm. No follow-up needed if patient is low-risk (and has no known or suspected primary neoplasm). Non-contrast chest CT can be considered in 12 months if patient is high-risk. This recommendation follows the consensus statement: Guidelines for Management of Incidental Pulmonary Nodules Detected on CT Images: From the Fleischner Society 2017; Radiology 2017; 284:228-243. Electronically Signed   By: Kristine Garbe M.D.   On: 05/27/2016 18:09   Ct Head Wo Contrast  Result Date: 05/26/2016 CLINICAL DATA:  Initial evaluation for sudden onset of confusion. EXAM: CT HEAD WITHOUT CONTRAST TECHNIQUE: Contiguous axial images were obtained from the base of the skull through the vertex without intravenous contrast. COMPARISON:  None. FINDINGS: Brain: Mild age-related cerebral atrophy with chronic small vessel ischemic disease. No acute intracranial hemorrhage. No evidence for acute large vessel territory infarct. No mass lesion, midline shift or mass effect. No hydrocephalus. No extra-axial fluid collection. Vascular: No hyperdense vessel. Vascular calcifications noted within the carotid siphons. Skull: Scalp soft tissues within normal limits.  Calvarium intact. Sinuses/Orbits: Globes and orbital soft tissues within normal limits. Patient is status post lens extraction bilaterally. Paranasal sinuses and mastoid air cells are clear. IMPRESSION: 1. No acute intracranial process identified. 2. Mild age-related cerebral atrophy with chronic small vessel ischemic disease. Electronically  Signed   By: Jeannine Boga M.D.   On: 05/26/2016 05:36   Ct Angio Neck W Or Wo Contrast  Result Date: 05/27/2016 CLINICAL DATA:  71 y/o  F; confusion and slurred speech. EXAM: CT  ANGIOGRAPHY HEAD AND NECK TECHNIQUE: Multidetector CT imaging of the head and neck was performed using the standard protocol during bolus administration of intravenous contrast. Multiplanar CT image reconstructions and MIPs were obtained to evaluate the vascular anatomy. Carotid stenosis measurements (when applicable) are obtained utilizing NASCET criteria, using the distal internal carotid diameter as the denominator. CONTRAST:  75 cc Isovue 370 COMPARISON:  05/26/2016 carotid ultrasound and 05/26/2016 MRI head. FINDINGS: CT HEAD FINDINGS Brain: No evidence of acute infarction, hemorrhage, hydrocephalus, extra-axial collection or mass lesion/mass effect. Small cortical infarcts on the prior MRI are not appreciable on CT. Stable background of chronic microvascular ischemic changes and parenchymal volume loss of the brain. Vascular: As below. Skull: Normal. Negative for fracture or focal lesion. Sinuses: Imaged portions are clear. Orbits: No acute finding. Review of the MIP images confirms the above findings CTA NECK FINDINGS Aortic arch: Standard branching. Imaged portion shows no evidence of aneurysm or dissection. No significant stenosis of the major arch vessel origins. Mild calcific atherosclerosis. Right carotid system: No evidence of dissection, stenosis (50% or greater) or occlusion. Dense calcified plaque of the bifurcation with mild 40% proximal ICA stenosis. Left carotid system: No evidence of dissection or occlusion. Dense calcified plaque of the bifurcation with moderate 60% proximal ICA stenosis. Vertebral arteries: Codominant. No evidence of dissection, stenosis (50% or greater) or occlusion. Calcified plaque of V3/4 junctions bilaterally with moderate stenosis. Skeleton: Mild cervical spondylosis. No high-grade  bony canal stenosis or foraminal narrowing. Other neck: 15 mm left lobe of thyroid nodule with coarse calcification. Upper chest: Left upper lobe pulmonary nodules measuring 4 mm (series 7, image 26 an 17). Scattered mucous plugging. Review of the MIP images confirms the above findings CTA HEAD FINDINGS Anterior circulation: No significant stenosis, proximal occlusion, aneurysm, or vascular malformation. Severe calcification of cavernous and paraclinoid internal carotid arteries with mild-to-moderate paraclinoid stenosis. Posterior circulation: No significant stenosis, proximal occlusion, aneurysm, or vascular malformation. Venous sinuses: As permitted by contrast timing, patent. Anatomic variants: Patent anterior communicating artery, diminutive right posterior communicating artery, and moderate left posterior communicating artery. Delayed phase: No abnormal intracranial enhancement. Review of the MIP images confirms the above findings IMPRESSION: 1. Right proximal ICA mild 40% stenosis due to calcified plaque. 2. Left proximal ICA moderate 60% stenosis due to calcified plaque. 3. Bilateral vertebral artery V3/V4 junction calcified plaque with moderate stenosis. 4. Bilateral ICA cavernous and paraclinoid severe calcific atherosclerosis with mild-to-moderate paraclinoid stenosis. 5. Otherwise no significant stenosis, proximal occlusion, aneurysm, or vascular malformation of circle of Willis. 6. Mild aortic atherosclerosis. 7. 15 mm left thyroid lobe nodule, this can be further characterized with thyroid ultrasound on a nonemergent basis. 8. Left upper lobe pulmonary nodules measuring up to 4 mm. No follow-up needed if patient is low-risk (and has no known or suspected primary neoplasm). Non-contrast chest CT can be considered in 12 months if patient is high-risk. This recommendation follows the consensus statement: Guidelines for Management of Incidental Pulmonary Nodules Detected on CT Images: From the Fleischner  Society 2017; Radiology 2017; 284:228-243. Electronically Signed   By: Kristine Garbe M.D.   On: 05/27/2016 18:09   Mr Brain Wo Contrast  Result Date: 05/26/2016 CLINICAL DATA:  Altered mental status.  History ovarian cancer EXAM: MRI HEAD WITHOUT CONTRAST TECHNIQUE: Multiplanar, multiecho pulse sequences of the brain and surrounding structures were obtained without intravenous contrast. COMPARISON:  CT head 05/26/2016 FINDINGS: Brain: Several small areas of restricted diffusion in the occipital and parietal lobes bilaterally consistent with acute  infarct. Small area of restricted diffusion also in the left lateral cerebellum. Probable posterior circulation emboli. Generalized atrophy. Mild chronic microvascular ischemic change in the white matter. Negative for hemorrhage or mass. Negative for hydrocephalus. Vascular: Normal arterial flow voids. Skull and upper cervical spine: Negative Sinuses/Orbits: Mild mucosal edema in the paranasal sinuses. Bilateral lens replacement. Other: None IMPRESSION: Multiple small areas of acute infarct involving the occipital parietal lobe bilaterally and the left lateral cerebellum, consistent with posterior circulation emboli Atrophy and mild chronic microvascular ischemia. Electronically Signed   By: Franchot Gallo M.D.   On: 05/26/2016 13:17   US Carotid Bilateral  Result Date: 05/27/2016 CLINICAL DATA:  Cerebral infarction EXAM: BILATERAL CAROTID DUPLEX ULTRASOUND TECHNIQUE: Pearline Cables scale imaging, color Doppler and duplex ultrasound were performed of bilateral carotid and vertebral arteries in the neck. COMPARISON:  None. FINDINGS: Criteria: Quantification of carotid stenosis is based on velocity parameters that correlate the residual internal carotid diameter with NASCENT-based stenosis levels, using the diameter of the distal internal carotid lumen as the denominator for stenosis measurement. The following velocity measurements were obtained: RIGHT ICA:  152  cm/sec CCA:  161 cm/sec SYSTOLIC ICA/CCA RATIO:  1.3 DIASTOLIC ICA/CCA RATIO:  1.7 ECA:  161 cm/sec LEFT ICA:  207 cm/sec CCA:  096 cm/sec SYSTOLIC ICA/CCA RATIO:  1.8 DIASTOLIC ICA/CCA RATIO:  2.6 ECA:  208 cm/sec RIGHT CAROTID ARTERY: Mild calcified plaque along the wall of the bulb. Low resistance internal carotid Doppler pattern is preserved. The lumen of the bulb appears patent on color Doppler imaging. RIGHT VERTEBRAL ARTERY:  Antegrade. LEFT CAROTID ARTERY: Moderate calcified plaque is present in the left bulb. This shadows the lumen somewhat. Low resistance internal carotid Doppler pattern is preserved. LEFT VERTEBRAL ARTERY:  Antegrade. IMPRESSION: Less than 50% stenosis in the right internal carotid artery. 50-69% stenosis in the left internal carotid artery. Electronically Signed   By: Marybelle Killings M.D.   On: 05/27/2016 09:36    Assessment: 71 y.o. female with a history of ovarian cancer who presents with complaints of headache, nausea/vomiting and confusion.  Patient improved today.  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  Embolic etiology suspected.  CTA ordered and reviewed showing no evidence of posterior circulation thrombosis.  LICA stenosis estimated at 60%.    Stroke Risk Factors - diabetes mellitus and hypertension  Plan: 1. HgbA1c, fasting lipid panel 2. PT consult, OT consult, Speech consult 3. Echocardiogram 4. Prophylactic therapy-Continue Eliquis 5. NPO until RN stroke swallow screen 6. Telemetry monitoring 7. Frequent neuro checks 8.  If stroke work up unremarkable patient to follow up with oncology to determine if another anticoagulant may be more appropriate.     Alexis Goodell, MD Neurology [phone] 05/27/2016, 9:30 PM

## 2016-05-27 NOTE — Evaluation (Signed)
Physical Therapy Evaluation Patient Details Name: CLEOFAS HAYNER MRN: [id number] DOB: 1946-02-17 Today's Date: 05/27/2016   History of Present Illness  71 y.o. female with a history of Ovarian cancer with mets- on chemo. She was very confused with slurred speech and also had headache. She had 4-5 total episodes of vomit, has colostomy bag.  Feeling much better at time of eval.  Clinical Impression  Pt did well with PT and showed good confidence and safety with all mobility and with prolonged ambulation.  She reports she is near her baseline and feels good about being able to go home and be safe.  She was very pleasant t/o the session and eager to do what she could.  Pt does not require further PT intervention, will complete orders.     Follow Up Recommendations No PT follow up    Equipment Recommendations       Recommendations for Other Services       Precautions / Restrictions Precautions Precautions: Fall Restrictions Weight Bearing Restrictions: No      Mobility  Bed Mobility Overal bed mobility: Independent                Transfers Overall transfer level: Independent Equipment used: Rolling walker (2 wheeled)             General transfer comment: Pt able to rise w/o any assist, shows good confidence and does not need excessive UE use   Ambulation/Gait Ambulation/Gait assistance: Modified independent (Device/Increase time) Ambulation Distance (Feet): 250 Feet Assistive device: Rolling walker (2 wheeled)       General Gait Details: Pt walks with good speed and confidence and had no safety issues with prolonged ambulation.  She was able to do some minimal ambulation w/o AD w/o LOBs or other issues.   Stairs            Wheelchair Mobility    Modified Rankin (Stroke Patients Only)       Balance Overall balance assessment: Independent                                           Pertinent Vitals/Pain Pain Assessment: No/denies  pain    Home Living Family/patient expects to be discharged to:: Private residence Living Arrangements: Spouse/significant other Available Help at Discharge: Family   Home Access: Stairs to enter Entrance Stairs-Rails: Can reach both Entrance Stairs-Number of Steps: 2          Prior Function Level of Independence: Independent (typically uses a cane in the home, can go w/o it, RW OOH)               Hand Dominance        Extremity/Trunk Assessment   Upper Extremity Assessment Upper Extremity Assessment: Overall WFL for tasks assessed    Lower Extremity Assessment Lower Extremity Assessment: Overall WFL for tasks assessed       Communication   Communication: No difficulties  Cognition Arousal/Alertness: Awake/alert Behavior During Therapy: WFL for tasks assessed/performed Overall Cognitive Status: Within Functional Limits for tasks assessed                      General Comments      Exercises     Assessment/Plan    PT Assessment Patent does not need any further PT services  PT Problem List  PT Treatment Interventions      PT Goals (Current goals can be found in the Care Plan section)  Acute Rehab PT Goals Patient Stated Goal: go home today PT Goal Formulation: With patient    Frequency     Barriers to discharge        Co-evaluation               End of Session Equipment Utilized During Treatment: Gait belt Activity Tolerance: Patient tolerated treatment well Patient left: with call bell/phone within reach;with chair alarm set           Time: [id number] PT Time Calculation (min) (ACUTE ONLY): 19 min   Charges:   PT Evaluation $PT Eval Low Complexity: 1 Procedure     PT G CodesKreg Shropshire, DPT 05/27/2016, 11:06 AM

## 2016-05-27 NOTE — Progress Notes (Signed)
SLP Cancellation Note  Patient Details Name: [name] MRN: [id number] DOB: 1945-09-22   Cancelled treatment:       Reason Eval/Treat Not Completed: SLP screened, no needs identified, will sign off (NSG consulted, pt chart reviewed ) Consulted pt in room; pt reports of being at her baseline. Did not have any trouble with her breakfast meals, "I ate yogurt, a few scrambled eggs and cream of wheat just fine". Pt denied any trouble with chewing or swallowing. Pt also reported being at baseline for her speech; "they said I had some slow speech yesterday, but I think it's much better today". NSG to re-consult if any changes during visit.     Eulogio Ditch, B.S Graduate Clinician 05/27/2016, 10:59 AM    This information has been reviewed and agreed upon by this supervising clinician.  Orinda Kenner, [place], CCC-SLP

## 2016-05-28 LAB — LIPID PANEL
CHOLESTEROL: 174 mg/dL (ref 0–200)
HDL: 60 mg/dL (ref >40)
LDL Cholesterol: 98 mg/dL (ref 0–99)
Total CHOL/HDL Ratio: 2.9 RATIO
Triglycerides: 80 mg/dL (ref <150)
VLDL: 16 mg/dL (ref 0–40)

## 2016-05-28 LAB — GLUCOSE, CAPILLARY
GLUCOSE-CAPILLARY: 147 mg/dL — AB (ref 65–99)
GLUCOSE-CAPILLARY: 239 mg/dL — AB (ref 65–99)
Glucose-Capillary: 275 mg/dL — ABNORMAL HIGH (ref 65–99)

## 2016-05-28 MED ORDER — ATORVASTATIN CALCIUM 40 MG PO TABS: 40.0000 mg | ORAL_TABLET | Freq: Every day | ORAL | 2 refills | Status: DC

## 2016-05-28 MED ORDER — SODIUM CHLORIDE 0.9% FLUSH
10.0000 mL | INTRAVENOUS | Status: DC | PRN
  Administered 2016-05-28: 10 mL via INTRAVENOUS
  Filled 2016-05-28: qty 10

## 2016-05-28 MED ORDER — HEPARIN SOD (PORK) LOCK FLUSH 100 UNIT/ML IV SOLN
500.0000 [IU] | Freq: Once | INTRAVENOUS | Status: AC
  Administered 2016-05-28: 13:00:00 500 [IU] via INTRAVENOUS
  Filled 2016-05-28: qty 5

## 2016-05-28 MED ORDER — ENOXAPARIN SODIUM 60 MG/0.6ML ~~LOC~~ SOLN: 60.0000 mg | Freq: Two times a day (BID) | SUBCUTANEOUS | 0 refills | Status: DC

## 2016-05-28 NOTE — Discharge Instructions (Signed)
Hold Eliquis for 48 hours, then start lovenox 60 mg SQ BID. Follow up Dr. Mike Gip this Monday.

## 2016-05-28 NOTE — Progress Notes (Signed)
Patient is alert and oriented x 4, ambulatory in room, on room air, good appetite, husband at bedside, denies pain, patient is d/c to home, patient denies concerns about going home, reports that strength is improved, patient instructed to hold eliquis, and start lovenox on Monday. Patient notes that she is familiar with lovenox and has administered it before. Patient will f/u with oncology at beginning of next week as instructed. Blood pressure low throughout acute hospitalization, hypertensive medications held, patient instructed to hold medicines if bp is lower than 120/60. Patient d/c via husband. Pushed to entrance via nursing staff. Port a cath removed. Uneventful shift.

## 2016-05-28 NOTE — Discharge Summary (Signed)
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 CAD (coronary artery disease)    a. 09/2012 Cath: LM nl, LAD 95p, 30m LCX 933mOM2 50, RCA 100.  . Marland Kitchenholelithiasis   . Chronic diastolic CHF (congestive heart failure) ([facility]  . Collagen vascular disease ([facility]  . Esophageal stricture   . Exertional shortness of breath   . GERD (gastroesophageal reflux disease)   . H/O hiatal hernia   . Herniated disc   . History of pancytopenia   . Hyperlipidemia   . Hypertension   . Hypokalemia   . Leukopenia 2012   s/p bone marrow biopsy, Dr. PaMa Hillock. NSTEMI (non-ST elevated myocardial infarction) ([facility]/[place]   "mild" (10/18/2012)  . Ovarian cancer ([facility] [place]   chemo  . Pneumonia 2013; 08/2012   "one lung; double" (10/18/2012)  . Rheumatoid arthritis(714.0)   . Type I diabetes mellitus ([facility]   "dx'd in 1957" (10/18/2012)   HOSPITAL COURSE:   * Acute embolic strokes- multi.   Source posterior circulation.   Neuro consult, Dr. ReDoy Minceuggest follow-up oncologist recommendation.   Echo is unremarkable, Carotid doppler showed bilateral carotid arterial stenosis less than 70%, CTA head and neck, PT eval.   Pt is already on Eliquis. Per Dr. FiGrayland Ormondhold Eliquis, start Lovenox twice a day in 48 hours. Follow-up with Dr. CoMike Giphis Monday.   Started on statin.  * cancer, mets , chemo,  Oncology consult,s he received  neupogen as per husband -   * Hypertension Now coming under control, Cont home meds.  * DM Keep on ISS.  * Hx of DVT On eliquis, cont.  * Ch diastolic CHF On lasix at home, cont.  Left upper lobe pulmonary nodules measuring up to 4 mm. followed oncologist as outpatient. I discussed with Dr. FiGrayland Ormondnd Dr. ReDoy MinceDISCHARGE CONDITIONS:  Stable, discharge to home today. CONSULTS OBTAINED:  Treatment Team:  MeLequita AsalMD LeAlexis GoodellMD DRUG ALLERGIES:   Allergies  Allergen Reactions  . Codeine Nausea And Vomiting  . Latex Rash   DISCHARGE MEDICATIONS:   Allergies as of 05/28/2016      Reactions   Codeine Nausea And Vomiting   Latex Rash      Medication List    STOP taking these medications   ELIQUIS 5 MG Tabs tablet Generic drug:  apixaban     TAKE these medications   atorvastatin 40 MG tablet Commonly known as:  LIPITOR Take 1 tablet (40 mg total) by mouth daily at 6 PM.   Calcium-Vitamin D 600-200 MG-UNIT tablet Take 2 tablets by mouth daily.   enoxaparin 60 MG/0.6ML injection Commonly known as:  LOVENOX Inject 0.6 mLs (60 mg total) into the skin every 12 (twelve) hours.   folic acid 1 MG tablet Commonly known as:  FOLVITE TAKE 1 TABLET BY MOUTH ONCE A DAY   insulin lispro 100 UNIT/ML injection Commonly known  as:  HUMALOG Inject 0.06 mLs (6 Units total) into the skin daily. What changed:  how much to take  additional instructions   lidocaine-prilocaine cream Commonly known as:  EMLA Apply 1 application topically as needed (prior to accessing port).   loratadine 10 MG tablet Commonly known as:  CLARITIN Take 1 tablet by mouth daily as needed.   losartan 100 MG tablet Commonly known as:  COZAAR Take 1 tablet (100 mg total) by mouth daily.   magnesium oxide 400 (241.3 Mg) MG tablet Commonly known as:  MAG-OX Take 1 tablet (400 mg total) by mouth daily.   metoprolol tartrate 25 MG tablet Commonly known as:   LOPRESSOR Take 1 tablet (25 mg total) by mouth 2 (two) times daily.   ondansetron 8 MG tablet Commonly known as:  ZOFRAN TAKE 1 TABLET BY MOUTH EVERY 8 HOURS AS NEEDED FOR NAUSEA OR VOMITING   ONE TOUCH ULTRA TEST test strip Generic drug:  glucose blood   pantoprazole 40 MG tablet Commonly known as:  PROTONIX TAKE 1 TABLET BY MOUTH TWICE A DAY   potassium chloride SA 20 MEQ tablet Commonly known as:  K-DUR,KLOR-CON Take 1 tablet (20 mEq total) by mouth daily. What changed:  how much to take   predniSONE 5 MG tablet Commonly known as:  DELTASONE Take 5 mg by mouth daily with breakfast.   ranitidine 150 MG tablet Commonly known as:  ZANTAC Take 1 tablet (150 mg total) by mouth 2 (two) times daily.   vitamin C 500 MG tablet Commonly known as:  ASCORBIC ACID Take 500 mg by mouth daily.   zinc gluconate 50 MG tablet Take 50 mg by mouth daily.        DISCHARGE INSTRUCTIONS:  See AVS.  If you experience worsening of your admission symptoms, develop shortness of breath, life threatening emergency, suicidal or homicidal thoughts you must seek medical attention immediately by calling 911 or calling your MD immediately  if symptoms less severe.  You Must read complete instructions/literature along with all the possible adverse reactions/side effects for all the Medicines you take and that have been prescribed to you. Take any new Medicines after you have completely understood and accpet all the possible adverse reactions/side effects.   Please note  You were cared for by a hospitalist during your hospital stay. If you have any questions about your discharge medications or the care you received while you were in the hospital after you are discharged, you can call the unit and asked to speak with the hospitalist on call if the hospitalist that took care of you is not available. Once you are discharged, your primary care physician will handle any further medical issues. Please note  that NO REFILLS for any discharge medications will be authorized once you are discharged, as it is imperative that you return to your primary care physician (or establish a relationship with a primary care physician if you do not have one) for your aftercare needs so that they can reassess your need for medications and monitor your lab values.    On the day of Discharge:  VITAL SIGNS:  Blood pressure (!) 145/44, pulse 87, temperature 98.2 F (36.8 C), temperature source Oral, resp. rate 20, height 5' 3"  (1.6 m), weight 143 lb 4.8 oz (65 kg), SpO2 95 %. PHYSICAL EXAMINATION:  GENERAL:  71 y.o.-year-old patient lying in the bed with no acute distress.  EYES: Pupils equal, round, reactive to light and accommodation. No scleral icterus. Extraocular muscles  intact.  HEENT: Head atraumatic, normocephalic. Oropharynx and nasopharynx clear.  NECK:  Supple, no jugular venous distention. No thyroid enlargement, no tenderness.  LUNGS: Normal breath sounds bilaterally, no wheezing, rales,rhonchi or crepitation. No use of accessory muscles of respiration.  CARDIOVASCULAR: S1, S2 normal. No murmurs, rubs, or gallops.  ABDOMEN: Soft, non-tender, non-distended. Bowel sounds present. No organomegaly or mass.  EXTREMITIES: No pedal edema, cyanosis, or clubbing.  NEUROLOGIC: Cranial nerves II through XII are intact. Muscle strength 5/5 in all extremities. Sensation intact. Gait not checked.  PSYCHIATRIC: The patient is alert and oriented x 3.  SKIN: No obvious rash, lesion, or ulcer.  DATA REVIEW:   CBC  Recent Labs Lab 05/27/16 0401  WBC 7.5  HGB 8.0*  HCT 22.7*  PLT 326    Chemistries   Recent Labs Lab 05/24/16 0855 05/26/16 0507 05/27/16 0401  NA 135 136 137  K 4.2 4.3 3.9  CL 104 102 104  CO2 25 24 27   GLUCOSE 183* 145* 155*  BUN 22* 25* 18  CREATININE 0.72 0.80 0.60  CALCIUM 8.8* 9.4 8.7*  MG 1.7  --   --   AST 26 21  --   ALT 18 15  --   ALKPHOS 71 72  --   BILITOT 0.3 0.9  --       Microbiology Results  Results for orders placed or performed during the hospital encounter of 05/26/16  Gastrointestinal Panel by PCR , Stool     Status: None   Collection Time: 05/26/16  6:14 PM  Result Value Ref Range Status   Campylobacter species NOT DETECTED NOT DETECTED Final   Plesimonas shigelloides NOT DETECTED NOT DETECTED Final   Salmonella species NOT DETECTED NOT DETECTED Final   Yersinia enterocolitica NOT DETECTED NOT DETECTED Final   Vibrio species NOT DETECTED NOT DETECTED Final   Vibrio cholerae NOT DETECTED NOT DETECTED Final   Enteroaggregative E coli (EAEC) NOT DETECTED NOT DETECTED Final   Enteropathogenic E coli (EPEC) NOT DETECTED NOT DETECTED Final   Enterotoxigenic E coli (ETEC) NOT DETECTED NOT DETECTED Final   Shiga like toxin producing E coli (STEC) NOT DETECTED NOT DETECTED Final   Shigella/Enteroinvasive E coli (EIEC) NOT DETECTED NOT DETECTED Final   Cryptosporidium NOT DETECTED NOT DETECTED Final   Cyclospora cayetanensis NOT DETECTED NOT DETECTED Final   Entamoeba histolytica NOT DETECTED NOT DETECTED Final   Giardia lamblia NOT DETECTED NOT DETECTED Final   Adenovirus F40/41 NOT DETECTED NOT DETECTED Final   Astrovirus NOT DETECTED NOT DETECTED Final   Norovirus GI/GII NOT DETECTED NOT DETECTED Final   Rotavirus A NOT DETECTED NOT DETECTED Final   Sapovirus (I, II, IV, and V) NOT DETECTED NOT DETECTED Final    RADIOLOGY:  Ct Angio Head W Or Wo Contrast  Result Date: 05/27/2016 CLINICAL DATA:  71 y/o  F; confusion and slurred speech. EXAM: CT ANGIOGRAPHY HEAD AND NECK TECHNIQUE: Multidetector CT imaging of the head and neck was performed using the standard protocol during bolus administration of intravenous contrast. Multiplanar CT image reconstructions and MIPs were obtained to evaluate the vascular anatomy. Carotid stenosis measurements (when applicable) are obtained utilizing NASCET criteria, using the distal internal carotid diameter as the  denominator. CONTRAST:  75 cc Isovue 370 COMPARISON:  05/26/2016 carotid ultrasound and 05/26/2016 MRI head. FINDINGS: CT HEAD FINDINGS Brain: No evidence of acute infarction, hemorrhage, hydrocephalus, extra-axial collection or mass lesion/mass effect. Small cortical infarcts on the prior MRI are not appreciable on CT. Stable  background of chronic microvascular ischemic changes and parenchymal volume loss of the brain. Vascular: As below. Skull: Normal. Negative for fracture or focal lesion. Sinuses: Imaged portions are clear. Orbits: No acute finding. Review of the MIP images confirms the above findings CTA NECK FINDINGS Aortic arch: Standard branching. Imaged portion shows no evidence of aneurysm or dissection. No significant stenosis of the major arch vessel origins. Mild calcific atherosclerosis. Right carotid system: No evidence of dissection, stenosis (50% or greater) or occlusion. Dense calcified plaque of the bifurcation with mild 40% proximal ICA stenosis. Left carotid system: No evidence of dissection or occlusion. Dense calcified plaque of the bifurcation with moderate 60% proximal ICA stenosis. Vertebral arteries: Codominant. No evidence of dissection, stenosis (50% or greater) or occlusion. Calcified plaque of V3/4 junctions bilaterally with moderate stenosis. Skeleton: Mild cervical spondylosis. No high-grade bony canal stenosis or foraminal narrowing. Other neck: 15 mm left lobe of thyroid nodule with coarse calcification. Upper chest: Left upper lobe pulmonary nodules measuring 4 mm (series 7, image 26 an 17). Scattered mucous plugging. Review of the MIP images confirms the above findings CTA HEAD FINDINGS Anterior circulation: No significant stenosis, proximal occlusion, aneurysm, or vascular malformation. Severe calcification of cavernous and paraclinoid internal carotid arteries with mild-to-moderate paraclinoid stenosis. Posterior circulation: No significant stenosis, proximal occlusion,  aneurysm, or vascular malformation. Venous sinuses: As permitted by contrast timing, patent. Anatomic variants: Patent anterior communicating artery, diminutive right posterior communicating artery, and moderate left posterior communicating artery. Delayed phase: No abnormal intracranial enhancement. Review of the MIP images confirms the above findings IMPRESSION: 1. Right proximal ICA mild 40% stenosis due to calcified plaque. 2. Left proximal ICA moderate 60% stenosis due to calcified plaque. 3. Bilateral vertebral artery V3/V4 junction calcified plaque with moderate stenosis. 4. Bilateral ICA cavernous and paraclinoid severe calcific atherosclerosis with mild-to-moderate paraclinoid stenosis. 5. Otherwise no significant stenosis, proximal occlusion, aneurysm, or vascular malformation of circle of Willis. 6. Mild aortic atherosclerosis. 7. 15 mm left thyroid lobe nodule, this can be further characterized with thyroid ultrasound on a nonemergent basis. 8. Left upper lobe pulmonary nodules measuring up to 4 mm. No follow-up needed if patient is low-risk (and has no known or suspected primary neoplasm). Non-contrast chest CT can be considered in 12 months if patient is high-risk. This recommendation follows the consensus statement: Guidelines for Management of Incidental Pulmonary Nodules Detected on CT Images: From the Fleischner Society 2017; Radiology 2017; 284:228-243. Electronically Signed   By: Kristine Garbe M.D.   On: 05/27/2016 18:09   Ct Angio Neck W Or Wo Contrast  Result Date: 05/27/2016 CLINICAL DATA:  71 y/o  F; confusion and slurred speech. EXAM: CT ANGIOGRAPHY HEAD AND NECK TECHNIQUE: Multidetector CT imaging of the head and neck was performed using the standard protocol during bolus administration of intravenous contrast. Multiplanar CT image reconstructions and MIPs were obtained to evaluate the vascular anatomy. Carotid stenosis measurements (when applicable) are obtained utilizing  NASCET criteria, using the distal internal carotid diameter as the denominator. CONTRAST:  75 cc Isovue 370 COMPARISON:  05/26/2016 carotid ultrasound and 05/26/2016 MRI head. FINDINGS: CT HEAD FINDINGS Brain: No evidence of acute infarction, hemorrhage, hydrocephalus, extra-axial collection or mass lesion/mass effect. Small cortical infarcts on the prior MRI are not appreciable on CT. Stable background of chronic microvascular ischemic changes and parenchymal volume loss of the brain. Vascular: As below. Skull: Normal. Negative for fracture or focal lesion. Sinuses: Imaged portions are clear. Orbits: No acute finding. Review of the MIP images  confirms the above findings CTA NECK FINDINGS Aortic arch: Standard branching. Imaged portion shows no evidence of aneurysm or dissection. No significant stenosis of the major arch vessel origins. Mild calcific atherosclerosis. Right carotid system: No evidence of dissection, stenosis (50% or greater) or occlusion. Dense calcified plaque of the bifurcation with mild 40% proximal ICA stenosis. Left carotid system: No evidence of dissection or occlusion. Dense calcified plaque of the bifurcation with moderate 60% proximal ICA stenosis. Vertebral arteries: Codominant. No evidence of dissection, stenosis (50% or greater) or occlusion. Calcified plaque of V3/4 junctions bilaterally with moderate stenosis. Skeleton: Mild cervical spondylosis. No high-grade bony canal stenosis or foraminal narrowing. Other neck: 15 mm left lobe of thyroid nodule with coarse calcification. Upper chest: Left upper lobe pulmonary nodules measuring 4 mm (series 7, image 26 an 17). Scattered mucous plugging. Review of the MIP images confirms the above findings CTA HEAD FINDINGS Anterior circulation: No significant stenosis, proximal occlusion, aneurysm, or vascular malformation. Severe calcification of cavernous and paraclinoid internal carotid arteries with mild-to-moderate paraclinoid stenosis.  Posterior circulation: No significant stenosis, proximal occlusion, aneurysm, or vascular malformation. Venous sinuses: As permitted by contrast timing, patent. Anatomic variants: Patent anterior communicating artery, diminutive right posterior communicating artery, and moderate left posterior communicating artery. Delayed phase: No abnormal intracranial enhancement. Review of the MIP images confirms the above findings IMPRESSION: 1. Right proximal ICA mild 40% stenosis due to calcified plaque. 2. Left proximal ICA moderate 60% stenosis due to calcified plaque. 3. Bilateral vertebral artery V3/V4 junction calcified plaque with moderate stenosis. 4. Bilateral ICA cavernous and paraclinoid severe calcific atherosclerosis with mild-to-moderate paraclinoid stenosis. 5. Otherwise no significant stenosis, proximal occlusion, aneurysm, or vascular malformation of circle of Willis. 6. Mild aortic atherosclerosis. 7. 15 mm left thyroid lobe nodule, this can be further characterized with thyroid ultrasound on a nonemergent basis. 8. Left upper lobe pulmonary nodules measuring up to 4 mm. No follow-up needed if patient is low-risk (and has no known or suspected primary neoplasm). Non-contrast chest CT can be considered in 12 months if patient is high-risk. This recommendation follows the consensus statement: Guidelines for Management of Incidental Pulmonary Nodules Detected on CT Images: From the Fleischner Society 2017; Radiology 2017; 284:228-243. Electronically Signed   By: Kristine Garbe M.D.   On: 05/27/2016 18:09     Management plans discussed with the patient, her son and they are in agreement.  CODE STATUS:     Code Status Orders        Start     Ordered   05/26/16 0915  Full code  Continuous     05/26/16 0915    Code Status History    Date Active Date Inactive Code Status Order ID Comments User Context   07/07/2015 10:49 PM 07/09/2015  8:26 PM Full Code [id number]  Lance Coon, MD Inpatient    07/01/2015  8:06 PM 07/03/2015  4:36 PM Full Code [id number]  Fritzi Mandes, MD Inpatient   03/02/2015 10:28 AM 03/03/2015 10:50 AM Full Code [id number]  Algernon Huxley, MD Inpatient   10/19/2012  3:03 PM 10/21/2012 12:23 PM Full Code [id number]  Nani Skillern, PA-C Inpatient    Advance Directive Documentation   Flowsheet Row Most Recent Value  Type of Advance Directive  Living will  Pre-existing out of facility DNR order (yellow form or pink MOST form)  No data  "MOST" Form in Place?  No data      TOTAL TIME TAKING CARE OF THIS PATIENT: 38 minutes.  Demetrios Loll M.D on 05/28/2016 at 4:44 PM  Between 7am to 6pm - Pager - [phone]  After 6pm go to [email or web address] - Proofreader  [facility]  Office  [phone]  CC: Primary care physician; Tommi Rumps, MD   Note: This dictation was prepared with Dragon dictation along with smaller phrase technology. Any transcriptional errors that result from this process are unintentional.

## 2016-05-29 LAB — HEMOGLOBIN A1C
Hgb A1c MFr Bld: 6.5 % — ABNORMAL HIGH (ref 4.8–5.6)
Mean Plasma Glucose: 140 mg/dL

## 2016-05-30 ENCOUNTER — Other Ambulatory Visit: Payer: Self-pay | Admitting: *Deleted

## 2016-05-30 ENCOUNTER — Inpatient Hospital Stay: Payer: Medicare Other

## 2016-05-30 ENCOUNTER — Telehealth: Payer: Self-pay | Admitting: *Deleted

## 2016-05-30 DIAGNOSIS — Z79899 Other long term (current) drug therapy: Secondary | ICD-10-CM | POA: Diagnosis not present

## 2016-05-30 DIAGNOSIS — E10621 Type 1 diabetes mellitus with foot ulcer: Secondary | ICD-10-CM | POA: Diagnosis not present

## 2016-05-30 DIAGNOSIS — L89623 Pressure ulcer of left heel, stage 3: Secondary | ICD-10-CM | POA: Diagnosis not present

## 2016-05-30 DIAGNOSIS — C787 Secondary malignant neoplasm of liver and intrahepatic bile duct: Secondary | ICD-10-CM | POA: Diagnosis not present

## 2016-05-30 DIAGNOSIS — Z794 Long term (current) use of insulin: Secondary | ICD-10-CM | POA: Diagnosis not present

## 2016-05-30 DIAGNOSIS — C801 Malignant (primary) neoplasm, unspecified: Secondary | ICD-10-CM

## 2016-05-30 DIAGNOSIS — D649 Anemia, unspecified: Secondary | ICD-10-CM

## 2016-05-30 DIAGNOSIS — M069 Rheumatoid arthritis, unspecified: Secondary | ICD-10-CM | POA: Diagnosis not present

## 2016-05-30 DIAGNOSIS — E1040 Type 1 diabetes mellitus with diabetic neuropathy, unspecified: Secondary | ICD-10-CM | POA: Diagnosis not present

## 2016-05-30 DIAGNOSIS — I110 Hypertensive heart disease with heart failure: Secondary | ICD-10-CM | POA: Diagnosis not present

## 2016-05-30 DIAGNOSIS — I82C11 Acute embolism and thrombosis of right internal jugular vein: Secondary | ICD-10-CM

## 2016-05-30 DIAGNOSIS — C786 Secondary malignant neoplasm of retroperitoneum and peritoneum: Secondary | ICD-10-CM | POA: Diagnosis not present

## 2016-05-30 DIAGNOSIS — Z9221 Personal history of antineoplastic chemotherapy: Secondary | ICD-10-CM | POA: Diagnosis not present

## 2016-05-30 DIAGNOSIS — I2510 Atherosclerotic heart disease of native coronary artery without angina pectoris: Secondary | ICD-10-CM | POA: Diagnosis not present

## 2016-05-30 DIAGNOSIS — Z7952 Long term (current) use of systemic steroids: Secondary | ICD-10-CM | POA: Diagnosis not present

## 2016-05-30 DIAGNOSIS — Z87891 Personal history of nicotine dependence: Secondary | ICD-10-CM | POA: Diagnosis not present

## 2016-05-30 DIAGNOSIS — I5032 Chronic diastolic (congestive) heart failure: Secondary | ICD-10-CM | POA: Diagnosis not present

## 2016-05-30 DIAGNOSIS — C561 Malignant neoplasm of right ovary: Secondary | ICD-10-CM

## 2016-05-30 DIAGNOSIS — C569 Malignant neoplasm of unspecified ovary: Secondary | ICD-10-CM | POA: Diagnosis not present

## 2016-05-30 LAB — MAGNESIUM: Magnesium: 1.2 mg/dL — ABNORMAL LOW (ref 1.7–2.4)

## 2016-05-30 LAB — CBC WITH DIFFERENTIAL/PLATELET
Basophils Absolute: 0 10*3/uL (ref 0–0.1)
Basophils Relative: 1 %
Eosinophils Absolute: 0 10*3/uL (ref 0–0.7)
Eosinophils Relative: 1 %
HCT: 24.8 % — ABNORMAL LOW (ref 35.0–47.0)
Hemoglobin: 8.5 g/dL — ABNORMAL LOW (ref 12.0–16.0)
Lymphocytes Relative: 28 %
Lymphs Abs: 0.6 10*3/uL — ABNORMAL LOW (ref 1.0–3.6)
MCH: 31.6 pg (ref 26.0–34.0)
MCHC: 34.5 g/dL (ref 32.0–36.0)
MCV: 91.4 fL (ref 80.0–100.0)
Monocytes Absolute: 0.2 10*3/uL (ref 0.2–0.9)
Monocytes Relative: 10 %
Neutro Abs: 1.2 10*3/uL — ABNORMAL LOW (ref 1.4–6.5)
Neutrophils Relative %: 60 %
Platelets: 275 10*3/uL (ref 150–440)
RBC: 2.71 MIL/uL — ABNORMAL LOW (ref 3.80–5.20)
RDW: 18.1 % — ABNORMAL HIGH (ref 11.5–14.5)
WBC: 2 10*3/uL — ABNORMAL LOW (ref 3.6–11.0)

## 2016-05-30 LAB — BASIC METABOLIC PANEL
Anion gap: 8 (ref 5–15)
BUN: 28 mg/dL — ABNORMAL HIGH (ref 6–20)
CO2: 27 mmol/L (ref 22–32)
Calcium: 9 mg/dL (ref 8.9–10.3)
Chloride: 100 mmol/L — ABNORMAL LOW (ref 101–111)
Creatinine, Ser: 0.87 mg/dL (ref 0.44–1.00)
GFR calc Af Amer: >60 mL/min (ref >60)
GFR calc non Af Amer: >60 mL/min (ref >60)
Glucose, Bld: 101 mg/dL — ABNORMAL HIGH (ref 65–99)
Potassium: 4.2 mmol/L (ref 3.5–5.1)
Sodium: 135 mmol/L (ref 135–145)

## 2016-05-30 LAB — SAMPLE TO BLOOD BANK

## 2016-05-30 MED ORDER — SODIUM CHLORIDE 0.9 % IV SOLN: 4.0000 g | Freq: Once | INTRAVENOUS | Status: DC

## 2016-05-30 MED ORDER — MAGNESIUM SULFATE 4 GM/100ML IV SOLN: 4.0000 g | Freq: Once | INTRAVENOUS | Status: DC

## 2016-05-30 MED ORDER — MAGNESIUM SULFATE 4 GM/100ML IV SOLN
4.0000 g | Freq: Once | INTRAVENOUS | Status: AC
  Administered 2016-05-30: 4 g via INTRAVENOUS
  Filled 2016-05-30: qty 100

## 2016-05-30 MED ORDER — FILGRASTIM 300 MCG/0.5ML IJ SOSY: 300.0000 ug | PREFILLED_SYRINGE | Freq: Once | INTRAMUSCULAR | Status: DC

## 2016-05-30 MED ORDER — MAGNESIUM SULFATE 2 GM/50ML IV SOLN: 2.0000 g | Freq: Once | INTRAVENOUS | Status: DC

## 2016-05-30 MED ORDER — HEPARIN SOD (PORK) LOCK FLUSH 100 UNIT/ML IV SOLN
500.0000 [IU] | Freq: Once | INTRAVENOUS | Status: AC
  Administered 2016-05-30: 500 [IU] via INTRAVENOUS

## 2016-05-30 MED ORDER — TBO-FILGRASTIM 300 MCG/0.5ML ~~LOC~~ SOSY
300.0000 ug | PREFILLED_SYRINGE | Freq: Once | SUBCUTANEOUS | Status: AC
  Administered 2016-05-30: 300 ug via SUBCUTANEOUS
  Filled 2016-05-30: qty 0.5

## 2016-05-30 NOTE — Progress Notes (Signed)
[name] is a 71 y.o. female with stage IV ovarian cancer who is seen for assessment on day 8 of cycle #4 carboplatin and gemcitabine.  HPI:  The patient was last seen in the medical oncology clinic on 05/24/2016.  At that time, she began cycle #4 carboplatin and gemcitabine.  She received GCSF on 05/25/2016.  She was admitted to [facility] from 05/26/2016 - 05/28/2016 with altered mental status and an acute embolic CVA.  Head MRI on 05/26/2016 revealed multiple small areas of acute infarct involving the occipital parietal lobe bilaterally and the left lateral cerebellum, consistent with posterior circulation emboli.  She was seen by neurology.  Bilateral carotid duplex on 05/26/2016 revealed less than 50% stenosis in the right internal carotid.  There was 50-69% stenosis in the left internal carotid.  Echo on 05/27/2016 revealed an EF of 60-65%.  There was no cardiac source of emboli.     CT angiogram of the head and neck on 05/27/2016 revealed right proximal ICA mild 40% stenosis due to calcified plaque.  There was left proximal ICA moderate 60% stenosis due to calcified plaque.  There was bilateral vertebral artery V3/V4 junction calcified plaque with moderate stenosis.  There was bilateral ICA cavernous and paraclinoid severe calcific atherosclerosis with mild-to-moderate paraclinoid stenosis. Otherwise there was no significant stenosis, proximal occlusion, aneurysm, or vascular malformation of circle of Willis.  It was recommended that she switch from Eliquis to Lovenox.  Labs on 05/30/2016 revealed a hematocrit of 24.8, hemoglobin 8.5, MCV 91.4, platelets 275,000, WBC 2000 with an [place] of 1200.  She received GCSF.  Magnesium was 1.2.  She received 4 gm of magnesium sulfate.  Symptomatically, she notes that her head is still foggy.  She is resistant about switching back to Lovenox injections because the  shots hurt.  Her Taxol neuropathy persists.   Past Medical History:  Diagnosis Date  . CAD (coronary artery disease)    a. 09/2012 Cath: LM nl, LAD 95p, 3m LCX 977mOM2 50, RCA 100.  . Marland Kitchenholelithiasis   . Chronic diastolic CHF (congestive heart failure) ([facility]  . Collagen vascular disease ([facility]  . Esophageal stricture   . Exertional shortness of breath   . GERD (gastroesophageal reflux disease)   . H/O hiatal hernia   . Herniated disc   . History of pancytopenia   . Hyperlipidemia   . Hypertension   . Hypokalemia   . Leukopenia 2012   s/p bone marrow biopsy, Dr. PaMa Hillock. NSTEMI (non-ST elevated myocardial infarction) ([facility]5/2014   "mild" (10/18/2012)  . Ovarian cancer ([facility]2016   chemo  . Pneumonia 2013; 08/2012   "one lung; double" (10/18/2012)  . Rheumatoid arthritis(714.0)   . Type I diabetes mellitus ([facility]   "dx'd in 1957" (10/18/2012)    Past Surgical History:  Procedure Laterality Date  . ABDOMINAL HYSTERECTOMY  06/15/2015   Dx L/S, EXLAP TAH BSO omentectomy RSRx colostomy diaphragm resection stripping  . CARDIAC CATHETERIZATION  10/18/2012   "first one was today" (10/18/2012)  . CATARACT EXTRACTION W/ INTRAOCULAR LENS IMPLANT Right 2010  . CHOLECYSTECTOMY  06/15/2015   combined case with ovarian cancer debulking  . COLON SURGERY    . CORONARY ARTERY BYPASS GRAFT N/A 10/19/2012   Procedure: CORONARY ARTERY BYPASS GRAFTING (CABG);  Surgeon: StMelrose NakayamaMD;  Location: [facility] Service: Open Heart Surgery;  Laterality: N/A;  .  ESOPHAGEAL DILATION     "3 or 4 times" (10/19/2011)  . ESOPHAGOGASTRODUODENOSCOPY  2012   Dr. Minna Merritts  . OSTOMY    . OVARY SURGERY     removal  . PERIPHERAL VASCULAR CATHETERIZATION N/A 03/02/2015   Procedure: Glori Luis Cath Insertion;  Surgeon: Algernon Huxley, MD;  Location: [facility];  Service: Cardiovascular;  Laterality: N/A;  . TUBAL LIGATION  1970    Family History  Problem Relation Age of Onset  . Diabetes Mother   .  Arthritis Mother   . Diabetes Father   . Arthritis Father   . Bone cancer Sister     Social History:  reports that she quit smoking about 21 years ago. Her smoking use included Cigarettes. She has a 30.00 pack-year smoking history. She has never used smokeless tobacco. She reports that she does not drink alcohol or use drugs.  She is accompanied by her husband today.  Allergies:  Allergies  Allergen Reactions  . Codeine Nausea And Vomiting  . Latex Rash    Current Medications: Current Outpatient Prescriptions  Medication Sig Dispense Refill  . atorvastatin (LIPITOR) 40 MG tablet Take 1 tablet (40 mg total) by mouth daily at 6 PM. 30 tablet 2  . Calcium-Vitamin D 600-200 MG-UNIT tablet Take 2 tablets by mouth daily.     Marland Kitchen enoxaparin (LOVENOX) 60 MG/0.6ML injection Inject 0.6 mLs (60 mg total) into the skin every 12 (twelve) hours. 6 mL 0  . folic acid (FOLVITE) 1 MG tablet TAKE 1 TABLET BY MOUTH ONCE A DAY 90 tablet 3  . insulin lispro (HUMALOG) 100 UNIT/ML injection Inject 0.06 mLs (6 Units total) into the skin daily. (Patient taking differently: Inject 0-85 Units into the skin daily. Via insulin pump) 30 mL 3  . lidocaine-prilocaine (EMLA) cream Apply 1 application topically as needed (prior to accessing port).    . loratadine (CLARITIN) 10 MG tablet Take 1 tablet by mouth daily as needed.    Marland Kitchen losartan (COZAAR) 100 MG tablet Take 1 tablet (100 mg total) by mouth daily. 90 tablet 1  . magnesium oxide (MAG-OX) 400 (241.3 Mg) MG tablet Take 1 tablet (400 mg total) by mouth daily. 30 tablet 2  . metoprolol tartrate (LOPRESSOR) 25 MG tablet Take 1 tablet (25 mg total) by mouth 2 (two) times daily. 180 tablet 3  . ondansetron (ZOFRAN) 8 MG tablet TAKE 1 TABLET BY MOUTH EVERY 8 HOURS AS NEEDED FOR NAUSEA OR VOMITING 30 tablet 1  . ONE TOUCH ULTRA TEST test strip     . pantoprazole (PROTONIX) 40 MG tablet TAKE 1 TABLET BY MOUTH TWICE A DAY 180 tablet 1  . potassium chloride SA  (K-DUR,KLOR-CON) 20 MEQ tablet Take 1 tablet (20 mEq total) by mouth daily. (Patient taking differently: Take 40 mEq by mouth daily. ) 20 tablet 0  . predniSONE (DELTASONE) 5 MG tablet Take 5 mg by mouth daily with breakfast.    . ranitidine (ZANTAC) 150 MG tablet Take 1 tablet (150 mg total) by mouth 2 (two) times daily. 180 tablet 1  . vitamin C (ASCORBIC ACID) 500 MG tablet Take 500 mg by mouth daily.    Marland Kitchen zinc gluconate 50 MG tablet Take 50 mg by mouth daily.    Marland Kitchen ELIQUIS 5 MG TABS tablet Take 5 mg by mouth daily.     No current facility-administered medications for this visit.    Facility-Administered Medications Ordered in Other Visits  Medication Dose Route Frequency Provider Last Rate  Last Dose  . magnesium sulfate IVPB 4 g 100 mL  4 g Intravenous Once Lequita Asal, MD      . sodium chloride 0.9 % injection 10 mL  10 mL Intracatheter PRN Lequita Asal, MD      . sodium chloride 0.9 % injection 10 mL  10 mL Intravenous PRN Lequita Asal, MD   10 mL at 04/13/15 6203    Review of Systems:  GENERAL:  Feels better.  No fever, chills or sweats.  Weight downp 2 pounds. PERFORMANCE STATUS (ECOG):  1 HEENT:  No visual changes, sore throat, mouth sores or tenderness. Lungs:  Shortness of breath on exertion.  No cough.  No hemoptysis. Cardiac:  No chest pain, palpitations, orthopnea, or PND. GI:  No abdominal pain or distention.  No nausea, vomiting, diarrhea, melena or hematochezia. GU:  No urgency, frequency, dysuria, or hematuria. Musculoskeletal:  Severe rheumatoid arthritis (off MTX and on prednisone).  Osteoporosis.  No back pain. No muscle tenderness. Extremities:  No pain or swelling. Skin:  No increased bruising or bleeding on Eliquis.  No rashes or skin changes. Neuro:  Foggy s/p CVA.  Taxol neuropathy (stable).  No headache, numbness or weakness, balance or coordination issues. Endocrine:  Diabetes on an insulin pump.  Blood sugar good.  No thyroid issues, hot  flashes or night sweats. Psych:  No mood changes, depression or anxiety. Pain: No pain. Review of systems:  All other systems reviewed and found to be negative.  Physical Exam: Blood pressure 127/69, pulse 84, temperature (!) 95.7 F (35.4 C), temperature source Tympanic, resp. rate 18, weight 137 lb 9.1 oz (62.4 kg). GENERAL:  Well developed, well nourished woman sitting comfortably in the exam room in no acute distress. MENTAL STATUS:  Alert and oriented to person, place and time. HEAD:  Curly gray hair.  Normocephalic, atraumatic, face symmetric, no Cushingoid features. EYES:  Gold rimmed glasses.  Blue eyes.  No conjunctivitis or scleral icterus.  ENT: Oropharynx clear without lesion. Tongue normal. Mucous membranes moist.  RESPIRATORY: Clear to auscultation without rales, wheezes or rhonchi. CARDIOVASCULAR: Regular rate and rhythm without murmur, rub or gallop. ABDOMEN: Soft, non-tender with active bowel sounds and no hepatosplenomegaly. No palpable nodularity or masses.  No shifting dullness.  Insulin pump. SKIN: No rashes, ulcers, or bruises. EXTREMITIES:  No edema, skin discoloration or tenderness. No palpable cords. LYMPH NODES: No palpable cervical, supraclavicular, axillary or inguinal adenopathy  NEUROLOGICAL: Appropriate. PSYCH: Appropriate.   Appointment on 05/30/2016  Component Date Value Ref Range Status  . Sodium 05/30/2016 135  135 - 145 mmol/L Final  . Potassium 05/30/2016 4.2  3.5 - 5.1 mmol/L Final  . Chloride 05/30/2016 100* 101 - 111 mmol/L Final  . CO2 05/30/2016 27  22 - 32 mmol/L Final  . Glucose, Bld 05/30/2016 101* 65 - 99 mg/dL Final  . BUN 05/30/2016 28* 6 - 20 mg/dL Final  . Creatinine, Ser 05/30/2016 0.87  0.44 - 1.00 mg/dL Final  . Calcium 05/30/2016 9.0  8.9 - 10.3 mg/dL Final  . GFR calc non Af Amer 05/30/2016 >60  >60 mL/min Final  . GFR calc Af Amer 05/30/2016 >60  >60 mL/min Final   Comment: (NOTE) The eGFR has been calculated using  the CKD EPI equation. This calculation has not been validated in all clinical situations. eGFR's persistently <60 mL/min signify possible Chronic Kidney Disease.   . Anion gap 05/30/2016 8  5 - 15 Final  . WBC 05/30/2016 2.0*  3.6 - 11.0 K/uL Final  . RBC 05/30/2016 2.71* 3.80 - 5.20 MIL/uL Final  . Hemoglobin 05/30/2016 8.5* 12.0 - 16.0 g/dL Final  . HCT 05/30/2016 24.8* 35.0 - 47.0 % Final  . MCV 05/30/2016 91.4  80.0 - 100.0 fL Final  . MCH 05/30/2016 31.6  26.0 - 34.0 pg Final  . MCHC 05/30/2016 34.5  32.0 - 36.0 g/dL Final  . RDW 05/30/2016 18.1* 11.5 - 14.5 % Final  . Platelets 05/30/2016 275  150 - 440 K/uL Final  . Neutrophils Relative % 05/30/2016 60  % Final  . Neutro Abs 05/30/2016 1.2* 1.4 - 6.5 K/uL Final  . Lymphocytes Relative 05/30/2016 28  % Final  . Lymphs Abs 05/30/2016 0.6* 1.0 - 3.6 K/uL Final  . Monocytes Relative 05/30/2016 10  % Final  . Monocytes Absolute 05/30/2016 0.2  0.2 - 0.9 K/uL Final  . Eosinophils Relative 05/30/2016 1  % Final  . Eosinophils Absolute 05/30/2016 0.0  0 - 0.7 K/uL Final  . Basophils Relative 05/30/2016 1  % Final  . Basophils Absolute 05/30/2016 0.0  0 - 0.1 K/uL Final  . Blood Bank Specimen 05/30/2016 SAMPLE AVAILABLE FOR TESTING   Final  . Sample Expiration 05/30/2016 06/02/2016   Final  . Magnesium 05/30/2016 1.2* 1.7 - 2.4 mg/dL Final    Assessment:  AKIVA JOSEY is a 71 y.o. female with stage IV ovarian cancer.  She presented with abdominal discomfort and bloating.  Omental biopsy on 02/23/2015 revealed metastatic high grade serous carcinoma, consistent with gynecologic origin.   She was initially diagnosed with clinical stage IIIC (T3cN1Mx).  CA125 was 707 on 02/17/2015.  Abdomen and pelvic CT scan on 02/13/2015 revealed bilateral mass-like adnexal regions (right adnexal mass 5.6 x 5.0 cm and the left adnexa mass 10.3 x 6.0 x 9.9 cm).  There was a large amount of soft tissue throughout the peritoneal cavity involving the  omentum and other peritoneal surfaces.  There was a small volume ascites. There was a peripheral 3.3 x 1.9 cm low-attenuation lesion overlying the right lobe of the liver, likely representing a serosal implant. There was 1.4 x 2.2 cm ill-defined peripheral lesion within the inferior aspect of segment 6 adjacent to the ampulla in the duodenum.   She received 4 cycles of neoadjuvant carboplatin and Taxol (03/05/2015 - 05/22/2015).  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  She had loose stools on oral magnesium.  She was initially on Neurontin then switched Lyrica with cycle #3.  Cycle #4 was notable for neutropenia (ANC 300) requiring GCSF x 3 days.    CA125 was 802.9 on 03/30/2015, 567.9 on 04/13/2015, 168.8 on 05/15/2015, 85.2 on 07/17/2015, 68.6 on 07/28/2015, 34.5 on 09/17/2015, 20.7 on 11/06/2015, 49 on 01/22/2016, 106.1 on 02/22/2016, 138.8 on 03/07/2016, 220.8 on 03/21/2016, 152.8 on 04/11/2016, 125.6 on 04/18/2016, 76.8 on 05/03/2016, and 60.2 on 05/24/2016.  Abdominal and pelvic CT scan on 04/28/2015 revealed decreasing bilateral ovarian masses.   The left adnexal mass measured 4.0 x 6.1 cm (previously 5.0 x 8.5 cm). The right ovary measured 3.8 x 4.9 cm (previously 4.7 x 5.6 cm).  There was improved peritoneal carcinomatosis.  There was a small amount of ascites.  The hepatic dome lesion was stable. The previously seen right hepatic lobe lesion was not well-visualized.  There was a small left pleural effusion and trace right pleural effusion.  The nodular lesion at the ampulla of Vater, extending into the duodenum was stable.  There was no  biliary ductal dilatation.  She underwent exploratory laparotomy, lysis of adhesions, total abdominal hysterectomy with bilateral salpingo-oophorectomy, infracolic omentectomy, optimal tumor debulking(< 1 cm), recto-sigmoid resection with creation of end colostomy, cholecystectomy, mobilization of splenic flexure and liver with diaphragmatic stripping on  06/15/2015. The right diaphragm was cleared of tumor. During dissection, the diaphragm was entered and closed with sutures.   She has had a recurrent right sided pleural effusion.  She underwent thoracentesis of 650 cc on post-operative day 3.  She was admitted to [facility] on 07/01/2015 and 07/07/2015 for recurrent shortness of breath.  She has undergone 2 additional thoracentesis (1.1 L on 07/02/2015 and 850 cc on 07/08/2015).  Cytology was negative x 2.  Bilateral lower extremity duplex on 07/03/2015 was negative.  Echo on 07/08/2015 revealed en EF of 55-60%.    She has severe rheumatoid arthritis.  Methotrexate and Enbrel were initially on hold.  She has a normocytic anemia.  Work-up on 02/17/2015 and 05/24/2016 revealed a normal ferritin, B12, folate, TSH.  She denies any melena or hematochezia.    She has anemia due to chronic disease. She received 1 unit PRBCs during her admission at [facility]. She denies any melena or hematochezia. She has diabetes and is on an insulin pump.  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 She was on Lovenox 60 mg twice a day.  She switched to Eliquis on 04/16/2016.  She was admitted to [facility] from 07/29/2015 - 08/10/2015 with a right-sided empyema and liver abscess. She underwent CT-guided placement of a liver abscess drain on 07/30/2015. Liver abscess culture grew out group B strep and Enterobacter which was sensitive to Zosyn. She was transitioned to ertapenem Colbert Ewing) prior to discharge.  She was readmitted to [facility] on 09/17/2015.  Chest, abdomen, and pelvic CT scan on 09/01/2015 revealed continued decrease in perihepatic fluid collection contiguous with the right pleural space with percutaneous drain. Drain was removed on 09/01/2015.  Chest, abdomen, and pelvic CT scan on 09/11/2015 revealed a residual versus recurrent  fluid collection posterior to the right hepatic lobe (2.6 x 1.1 x 4.0 cm).  Chest, abdomen, and pelvic CT scan on 10/13/2015 revealed resolution of the empyema.    She has had persistent neutropenia felt secondary to her rheumatoid arthritis.  Folate and MMA were normal.  TSH was 6.13 (high) with a free T4 of 1.17 (0.61-1.12).  She began methotrexate (10 mg a week) and prednisone (5 mg a day) for severe rheumatoid arthritis on 12/17/2015.  Bone marrow aspirate and biopsy on 06/09/2010 revealed a hypercellular marrow (70%) with no evidence of dysplasia or malignancy.  Flow cytometry was negative.  Cytogenetics were normal (46,XX).  FISH studies were negative for MDS.  Bone marrow aspirate and biopsy on 12/03/2015 revealed a normocellular to mildy hypercellular marrow for age (40%) with left shifted myelopoiesis, non specific dyserythropoiesis and mild megakaryocytic atypia with no increase in blasts.  There were multiple small nonspecific lymphoid aggregates (favor reactive). There was no increase in reticulin.  There was decreased myeloid cells (37%) with left shifted maturation and 1% atypical myelod blasts.  There was relatively increased monocytic cells (11%), relatively increased lymphoid cells (36%), and relatively increased eosinophils (6%).  Cytogenetics were normal (59, XX).  SNP microarray was normal.  Alkaline phosphatase was 348 (38-126) on 11/06/2015 and 431 on 11/17/2015.   Fractionated alkaline phosphatase on 11/09/2015 revealed 21% bone  and 79% liver.  MRI of the abdomen on 12/14/2015 revealed mild extrahepatic biliary duct dilatation without obstructing lesion identified.  Chest, abdomen, and pelvic CT scan on 02/15/2016 revealed multiple soft tissue nodules throughout the pelvis, small bowel mesenteric and peritoneum and, concerning for peritoneal metastatic disease.  There was soft tissue irregularity within the left upper quadrant.  There was mild right hydronephrosis (etiology unclear).   There was interval resolution of previously described right pleural based fluid and gas collection. There was a pleural based nodule within the right lower hemi-thorax concerning for pleural based metastasis.  There were small bilateral pleural effusions.  There was pulmonary nodularity, predominately within the left upper lobe (metastatic or infectious/inflammatory etiology).  There was slightly increased mediastinal adenopathy (infectious/inflammatory or metastatic).  There was a low attenuation lesion within the left hepatic lobe (complicated fluid within the fissure or metastatic disease).  She was on tamoxifen from 02/25/2016 - 03/14/2016.  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  Abdomen and pelvic CT scan on 03/08/2016 revealed mild progression of peritoneal disease in the abdomen.  There was interval increase in loculated fluid around the lateral segment left liver, stomach, and spleen.  There was persistent soft tissue lesion at the level of the ampulla with mild intra and extrahepatic biliary duct dilatation.  There was persistent intrahepatic and capsular metastatic disease involving the liver.  She is currently day 8 of cycle #4 carboplatin and gemcitabine (03/21/2016 - 05/24/2016) with Neulasta support.  Cycle #1 and #2 were truncated secondary to low counts at initiation of treatment and day 8, respectively.  Nadir platelet count was 107,000 on day 11 of cycle #1, 105,000 on day 11 cycle #2, and 66,000 on day #14 of cycle #3.   She received GCSF on day 2, 3, 9, 10, 11, and 14 of cycle #3.  She received GCSF on day 2, 3, and 7 of cycle #4.  She was admitted to [facility] from 05/26/2016 - 05/28/2016 with altered mental status and an acute embolic CVA.  Head MRI on 05/26/2016 revealed multiple small areas of acute infarct involving the occipital parietal lobe bilaterally and the left lateral cerebellum, consistent with posterior circulation emboli.  Work-up included a negative  carotid ultrasound and echocardiogram.  Symptomatically, she feels better.  Counts are at patient's baseline (WBC 2000 with ANC 1200).  Platelet count is normal. She has hypomagnesemia (1.2).  Plan: 1.  Labs today:  CBC with diff, BMP, Mg, type and hold. 2.  Discuss events of hospitalization.   3.  Discuss phone follow-up with neurology.  Discuss anticoagulation.  Recommend switch back to Lovenox. 4.  Discuss issues with Procrit and CVA. 5.  Discuss plans for restaging studies after cycle #4. 6.  Neupogen 300 mcg today 7.  Continue Eliquis until switch to Lovenox. 8.  Preauth Lovenox 60 mg SQ q 12 hours. 9.  Schedule abdomen/pelvic CT on 06/10/2016. 10.  RTC on 06/02/2016 for labs (CBC with diff, BMP, Mg) and +/- Neupogen, IVI magnesium 11.  RTC on 06/10/2016 for labs (BMP, Mg) +/- IV magnesium 12.  RTC on 06/14/2016 for MD assessment, labs (CBC with diff, CMP, Mg), review of imaging, and day 1 of cycle #5 carboplatin and gemcitabine.   Lequita Asal, MD  05/31/2016, 9:44 AM

## 2016-05-30 NOTE — Telephone Encounter (Signed)
Called and spoke with husband, pt to come in today and get cbc and possible granix as she has been in the hospital and we need to make sure that her wbc's are not too low. Voiced understanding.

## 2016-05-31 ENCOUNTER — Other Ambulatory Visit: Payer: Self-pay | Admitting: Hematology and Oncology

## 2016-05-31 ENCOUNTER — Inpatient Hospital Stay (HOSPITAL_BASED_OUTPATIENT_CLINIC_OR_DEPARTMENT_OTHER): Payer: Medicare Other | Admitting: Hematology and Oncology

## 2016-05-31 ENCOUNTER — Inpatient Hospital Stay: Payer: Medicare Other

## 2016-05-31 ENCOUNTER — Encounter: Payer: Self-pay | Admitting: Hematology and Oncology

## 2016-05-31 VITALS — BP 127/69 | HR 84 | Temp 95.7°F | Resp 18 | Wt 137.6 lb

## 2016-05-31 DIAGNOSIS — D709 Neutropenia, unspecified: Secondary | ICD-10-CM

## 2016-05-31 DIAGNOSIS — C801 Malignant (primary) neoplasm, unspecified: Secondary | ICD-10-CM

## 2016-05-31 DIAGNOSIS — R918 Other nonspecific abnormal finding of lung field: Secondary | ICD-10-CM

## 2016-05-31 DIAGNOSIS — M2550 Pain in unspecified joint: Secondary | ICD-10-CM

## 2016-05-31 DIAGNOSIS — I998 Other disorder of circulatory system: Secondary | ICD-10-CM

## 2016-05-31 DIAGNOSIS — E876 Hypokalemia: Secondary | ICD-10-CM

## 2016-05-31 DIAGNOSIS — I82C11 Acute embolism and thrombosis of right internal jugular vein: Secondary | ICD-10-CM

## 2016-05-31 DIAGNOSIS — I6522 Occlusion and stenosis of left carotid artery: Secondary | ICD-10-CM

## 2016-05-31 DIAGNOSIS — I10 Essential (primary) hypertension: Secondary | ICD-10-CM

## 2016-05-31 DIAGNOSIS — Z79899 Other long term (current) drug therapy: Secondary | ICD-10-CM

## 2016-05-31 DIAGNOSIS — Z90722 Acquired absence of ovaries, bilateral: Secondary | ICD-10-CM

## 2016-05-31 DIAGNOSIS — Z794 Long term (current) use of insulin: Secondary | ICD-10-CM

## 2016-05-31 DIAGNOSIS — Z9641 Presence of insulin pump (external) (internal): Secondary | ICD-10-CM

## 2016-05-31 DIAGNOSIS — Z7901 Long term (current) use of anticoagulants: Secondary | ICD-10-CM

## 2016-05-31 DIAGNOSIS — C569 Malignant neoplasm of unspecified ovary: Secondary | ICD-10-CM

## 2016-05-31 DIAGNOSIS — I639 Cerebral infarction, unspecified: Secondary | ICD-10-CM

## 2016-05-31 DIAGNOSIS — E785 Hyperlipidemia, unspecified: Secondary | ICD-10-CM

## 2016-05-31 DIAGNOSIS — K449 Diaphragmatic hernia without obstruction or gangrene: Secondary | ICD-10-CM

## 2016-05-31 DIAGNOSIS — I6340 Cerebral infarction due to embolism of unspecified cerebral artery: Secondary | ICD-10-CM

## 2016-05-31 DIAGNOSIS — C786 Secondary malignant neoplasm of retroperitoneum and peritoneum: Secondary | ICD-10-CM

## 2016-05-31 DIAGNOSIS — I252 Old myocardial infarction: Secondary | ICD-10-CM

## 2016-05-31 DIAGNOSIS — Z9049 Acquired absence of other specified parts of digestive tract: Secondary | ICD-10-CM

## 2016-05-31 DIAGNOSIS — K222 Esophageal obstruction: Secondary | ICD-10-CM

## 2016-05-31 DIAGNOSIS — Z8619 Personal history of other infectious and parasitic diseases: Secondary | ICD-10-CM

## 2016-05-31 DIAGNOSIS — Z933 Colostomy status: Secondary | ICD-10-CM

## 2016-05-31 DIAGNOSIS — C787 Secondary malignant neoplasm of liver and intrahepatic bile duct: Secondary | ICD-10-CM

## 2016-05-31 DIAGNOSIS — D72819 Decreased white blood cell count, unspecified: Secondary | ICD-10-CM

## 2016-05-31 DIAGNOSIS — Z808 Family history of malignant neoplasm of other organs or systems: Secondary | ICD-10-CM

## 2016-05-31 DIAGNOSIS — Z87891 Personal history of nicotine dependence: Secondary | ICD-10-CM

## 2016-05-31 DIAGNOSIS — K219 Gastro-esophageal reflux disease without esophagitis: Secondary | ICD-10-CM

## 2016-05-31 DIAGNOSIS — I2510 Atherosclerotic heart disease of native coronary artery without angina pectoris: Secondary | ICD-10-CM

## 2016-05-31 DIAGNOSIS — Z862 Personal history of diseases of the blood and blood-forming organs and certain disorders involving the immune mechanism: Secondary | ICD-10-CM

## 2016-05-31 DIAGNOSIS — R0602 Shortness of breath: Secondary | ICD-10-CM

## 2016-05-31 DIAGNOSIS — G629 Polyneuropathy, unspecified: Secondary | ICD-10-CM

## 2016-05-31 DIAGNOSIS — R188 Other ascites: Secondary | ICD-10-CM

## 2016-05-31 DIAGNOSIS — Z8701 Personal history of pneumonia (recurrent): Secondary | ICD-10-CM

## 2016-05-31 DIAGNOSIS — E108 Type 1 diabetes mellitus with unspecified complications: Secondary | ICD-10-CM

## 2016-05-31 DIAGNOSIS — Z86718 Personal history of other venous thrombosis and embolism: Secondary | ICD-10-CM

## 2016-05-31 DIAGNOSIS — N1330 Unspecified hydronephrosis: Secondary | ICD-10-CM

## 2016-05-31 DIAGNOSIS — M898X9 Other specified disorders of bone, unspecified site: Secondary | ICD-10-CM

## 2016-05-31 DIAGNOSIS — I5032 Chronic diastolic (congestive) heart failure: Secondary | ICD-10-CM

## 2016-05-31 DIAGNOSIS — M069 Rheumatoid arthritis, unspecified: Secondary | ICD-10-CM

## 2016-05-31 DIAGNOSIS — Z90710 Acquired absence of both cervix and uterus: Secondary | ICD-10-CM

## 2016-05-31 DIAGNOSIS — E10621 Type 1 diabetes mellitus with foot ulcer: Secondary | ICD-10-CM | POA: Diagnosis not present

## 2016-05-31 MED ORDER — ENOXAPARIN SODIUM 60 MG/0.6ML ~~LOC~~ SOLN: 60.0000 mg | Freq: Two times a day (BID) | SUBCUTANEOUS | 1 refills | Status: DC

## 2016-05-31 MED ORDER — TBO-FILGRASTIM 300 MCG/0.5ML ~~LOC~~ SOSY
300.0000 ug | PREFILLED_SYRINGE | Freq: Once | SUBCUTANEOUS | Status: AC
  Administered 2016-05-31: 300 ug via SUBCUTANEOUS
  Filled 2016-05-31: qty 0.5

## 2016-05-31 MED ORDER — FILGRASTIM 300 MCG/0.5ML IJ SOSY: 300.0000 ug | PREFILLED_SYRINGE | Freq: Once | INTRAMUSCULAR | Status: DC

## 2016-05-31 NOTE — Progress Notes (Signed)
Spoke with Dr. Mike Gip, patient will not receive Procrit today, she said it is contraindicated because patient has had a stroke and she will need to remove from her treatment plan.  [name]

## 2016-06-02 ENCOUNTER — Other Ambulatory Visit: Payer: Self-pay

## 2016-06-02 ENCOUNTER — Other Ambulatory Visit: Payer: Self-pay | Admitting: Hematology and Oncology

## 2016-06-02 ENCOUNTER — Inpatient Hospital Stay: Payer: Medicare Other | Attending: Hematology and Oncology

## 2016-06-02 ENCOUNTER — Inpatient Hospital Stay: Payer: Medicare Other

## 2016-06-02 ENCOUNTER — Other Ambulatory Visit: Payer: Self-pay | Admitting: *Deleted

## 2016-06-02 DIAGNOSIS — K222 Esophageal obstruction: Secondary | ICD-10-CM | POA: Insufficient documentation

## 2016-06-02 DIAGNOSIS — R188 Other ascites: Secondary | ICD-10-CM | POA: Diagnosis not present

## 2016-06-02 DIAGNOSIS — Z7901 Long term (current) use of anticoagulants: Secondary | ICD-10-CM | POA: Insufficient documentation

## 2016-06-02 DIAGNOSIS — R918 Other nonspecific abnormal finding of lung field: Secondary | ICD-10-CM | POA: Insufficient documentation

## 2016-06-02 DIAGNOSIS — E785 Hyperlipidemia, unspecified: Secondary | ICD-10-CM | POA: Diagnosis not present

## 2016-06-02 DIAGNOSIS — I10 Essential (primary) hypertension: Secondary | ICD-10-CM | POA: Diagnosis not present

## 2016-06-02 DIAGNOSIS — I252 Old myocardial infarction: Secondary | ICD-10-CM | POA: Insufficient documentation

## 2016-06-02 DIAGNOSIS — K219 Gastro-esophageal reflux disease without esophagitis: Secondary | ICD-10-CM | POA: Insufficient documentation

## 2016-06-02 DIAGNOSIS — I82C11 Acute embolism and thrombosis of right internal jugular vein: Secondary | ICD-10-CM

## 2016-06-02 DIAGNOSIS — C786 Secondary malignant neoplasm of retroperitoneum and peritoneum: Secondary | ICD-10-CM | POA: Diagnosis not present

## 2016-06-02 DIAGNOSIS — E108 Type 1 diabetes mellitus with unspecified complications: Secondary | ICD-10-CM | POA: Diagnosis not present

## 2016-06-02 DIAGNOSIS — Z90722 Acquired absence of ovaries, bilateral: Secondary | ICD-10-CM | POA: Insufficient documentation

## 2016-06-02 DIAGNOSIS — I5032 Chronic diastolic (congestive) heart failure: Secondary | ICD-10-CM | POA: Insufficient documentation

## 2016-06-02 DIAGNOSIS — Z8673 Personal history of transient ischemic attack (TIA), and cerebral infarction without residual deficits: Secondary | ICD-10-CM | POA: Insufficient documentation

## 2016-06-02 DIAGNOSIS — Z7952 Long term (current) use of systemic steroids: Secondary | ICD-10-CM | POA: Insufficient documentation

## 2016-06-02 DIAGNOSIS — Z87891 Personal history of nicotine dependence: Secondary | ICD-10-CM | POA: Insufficient documentation

## 2016-06-02 DIAGNOSIS — D638 Anemia in other chronic diseases classified elsewhere: Secondary | ICD-10-CM | POA: Diagnosis not present

## 2016-06-02 DIAGNOSIS — N1330 Unspecified hydronephrosis: Secondary | ICD-10-CM | POA: Diagnosis not present

## 2016-06-02 DIAGNOSIS — K449 Diaphragmatic hernia without obstruction or gangrene: Secondary | ICD-10-CM | POA: Diagnosis not present

## 2016-06-02 DIAGNOSIS — Z79899 Other long term (current) drug therapy: Secondary | ICD-10-CM | POA: Insufficient documentation

## 2016-06-02 DIAGNOSIS — Z5111 Encounter for antineoplastic chemotherapy: Secondary | ICD-10-CM | POA: Diagnosis not present

## 2016-06-02 DIAGNOSIS — E876 Hypokalemia: Secondary | ICD-10-CM | POA: Insufficient documentation

## 2016-06-02 DIAGNOSIS — D649 Anemia, unspecified: Secondary | ICD-10-CM

## 2016-06-02 DIAGNOSIS — I639 Cerebral infarction, unspecified: Secondary | ICD-10-CM

## 2016-06-02 DIAGNOSIS — D72819 Decreased white blood cell count, unspecified: Secondary | ICD-10-CM | POA: Insufficient documentation

## 2016-06-02 DIAGNOSIS — I2510 Atherosclerotic heart disease of native coronary artery without angina pectoris: Secondary | ICD-10-CM | POA: Insufficient documentation

## 2016-06-02 DIAGNOSIS — M069 Rheumatoid arthritis, unspecified: Secondary | ICD-10-CM | POA: Diagnosis not present

## 2016-06-02 DIAGNOSIS — Z7689 Persons encountering health services in other specified circumstances: Secondary | ICD-10-CM | POA: Diagnosis not present

## 2016-06-02 DIAGNOSIS — C569 Malignant neoplasm of unspecified ovary: Secondary | ICD-10-CM | POA: Diagnosis present

## 2016-06-02 DIAGNOSIS — Z86718 Personal history of other venous thrombosis and embolism: Secondary | ICD-10-CM | POA: Insufficient documentation

## 2016-06-02 DIAGNOSIS — Z9641 Presence of insulin pump (external) (internal): Secondary | ICD-10-CM | POA: Insufficient documentation

## 2016-06-02 DIAGNOSIS — Z794 Long term (current) use of insulin: Secondary | ICD-10-CM | POA: Insufficient documentation

## 2016-06-02 DIAGNOSIS — Z90710 Acquired absence of both cervix and uterus: Secondary | ICD-10-CM | POA: Insufficient documentation

## 2016-06-02 DIAGNOSIS — J90 Pleural effusion, not elsewhere classified: Secondary | ICD-10-CM | POA: Insufficient documentation

## 2016-06-02 DIAGNOSIS — I998 Other disorder of circulatory system: Secondary | ICD-10-CM | POA: Insufficient documentation

## 2016-06-02 DIAGNOSIS — Z808 Family history of malignant neoplasm of other organs or systems: Secondary | ICD-10-CM | POA: Insufficient documentation

## 2016-06-02 DIAGNOSIS — C801 Malignant (primary) neoplasm, unspecified: Secondary | ICD-10-CM

## 2016-06-02 DIAGNOSIS — Z9049 Acquired absence of other specified parts of digestive tract: Secondary | ICD-10-CM | POA: Insufficient documentation

## 2016-06-02 DIAGNOSIS — Z8701 Personal history of pneumonia (recurrent): Secondary | ICD-10-CM | POA: Insufficient documentation

## 2016-06-02 LAB — BASIC METABOLIC PANEL
Anion gap: 8 (ref 5–15)
BUN: 20 mg/dL (ref 6–20)
CO2: 28 mmol/L (ref 22–32)
Calcium: 9.4 mg/dL (ref 8.9–10.3)
Chloride: 103 mmol/L (ref 101–111)
Creatinine, Ser: 0.89 mg/dL (ref 0.44–1.00)
GFR calc Af Amer: >60 mL/min (ref >60)
GFR calc non Af Amer: >60 mL/min (ref >60)
Glucose, Bld: 133 mg/dL — ABNORMAL HIGH (ref 65–99)
Potassium: 3.9 mmol/L (ref 3.5–5.1)
Sodium: 139 mmol/L (ref 135–145)

## 2016-06-02 LAB — MAGNESIUM: Magnesium: 1.2 mg/dL — ABNORMAL LOW (ref 1.7–2.4)

## 2016-06-02 LAB — CBC WITH DIFFERENTIAL/PLATELET
Basophils Absolute: 0 10*3/uL (ref 0–0.1)
Basophils Relative: 1 %
Eosinophils Absolute: 0 10*3/uL (ref 0–0.7)
Eosinophils Relative: 1 %
HCT: 24.7 % — ABNORMAL LOW (ref 35.0–47.0)
Hemoglobin: 8.6 g/dL — ABNORMAL LOW (ref 12.0–16.0)
Lymphocytes Relative: 15 %
Lymphs Abs: 1.1 10*3/uL (ref 1.0–3.6)
MCH: 32.1 pg (ref 26.0–34.0)
MCHC: 34.7 g/dL (ref 32.0–36.0)
MCV: 92.6 fL (ref 80.0–100.0)
Monocytes Absolute: 0.7 10*3/uL (ref 0.2–0.9)
Monocytes Relative: 9 %
Neutro Abs: 5.5 10*3/uL (ref 1.4–6.5)
Neutrophils Relative %: 74 %
Platelets: 168 10*3/uL (ref 150–440)
RBC: 2.67 MIL/uL — ABNORMAL LOW (ref 3.80–5.20)
RDW: 17.9 % — ABNORMAL HIGH (ref 11.5–14.5)
WBC: 7.4 10*3/uL (ref 3.6–11.0)

## 2016-06-02 MED ORDER — MAGNESIUM SULFATE 4 GM/100ML IV SOLN
4.0000 g | Freq: Once | INTRAVENOUS | Status: AC
  Administered 2016-06-02: 4 g via INTRAVENOUS
  Filled 2016-06-02: qty 100

## 2016-06-02 MED ORDER — SODIUM CHLORIDE 0.9% FLUSH
10.0000 mL | INTRAVENOUS | Status: DC | PRN
  Administered 2016-06-02: 10 mL via INTRAVENOUS
  Filled 2016-06-02: qty 10

## 2016-06-02 MED ORDER — SODIUM CHLORIDE 0.9 % IV SOLN
INTRAVENOUS | Status: DC
  Administered 2016-06-02: 10:00:00 via INTRAVENOUS
  Filled 2016-06-02: qty 1000

## 2016-06-02 MED ORDER — HEPARIN SOD (PORK) LOCK FLUSH 100 UNIT/ML IV SOLN
500.0000 [IU] | Freq: Once | INTRAVENOUS | Status: AC
  Administered 2016-06-02: 500 [IU] via INTRAVENOUS
  Filled 2016-06-02: qty 5

## 2016-06-03 ENCOUNTER — Encounter: Payer: Medicare Other | Attending: Surgery | Admitting: Surgery

## 2016-06-03 DIAGNOSIS — M069 Rheumatoid arthritis, unspecified: Secondary | ICD-10-CM | POA: Diagnosis not present

## 2016-06-03 DIAGNOSIS — L89623 Pressure ulcer of left heel, stage 3: Secondary | ICD-10-CM | POA: Insufficient documentation

## 2016-06-03 DIAGNOSIS — Z9221 Personal history of antineoplastic chemotherapy: Secondary | ICD-10-CM | POA: Insufficient documentation

## 2016-06-03 DIAGNOSIS — Z79899 Other long term (current) drug therapy: Secondary | ICD-10-CM | POA: Insufficient documentation

## 2016-06-03 DIAGNOSIS — I5032 Chronic diastolic (congestive) heart failure: Secondary | ICD-10-CM | POA: Insufficient documentation

## 2016-06-03 DIAGNOSIS — Z87891 Personal history of nicotine dependence: Secondary | ICD-10-CM | POA: Diagnosis not present

## 2016-06-03 DIAGNOSIS — C787 Secondary malignant neoplasm of liver and intrahepatic bile duct: Secondary | ICD-10-CM | POA: Insufficient documentation

## 2016-06-03 DIAGNOSIS — C786 Secondary malignant neoplasm of retroperitoneum and peritoneum: Secondary | ICD-10-CM | POA: Diagnosis not present

## 2016-06-03 DIAGNOSIS — Z7952 Long term (current) use of systemic steroids: Secondary | ICD-10-CM | POA: Diagnosis not present

## 2016-06-03 DIAGNOSIS — E10621 Type 1 diabetes mellitus with foot ulcer: Secondary | ICD-10-CM | POA: Diagnosis present

## 2016-06-03 DIAGNOSIS — E1040 Type 1 diabetes mellitus with diabetic neuropathy, unspecified: Secondary | ICD-10-CM | POA: Insufficient documentation

## 2016-06-03 DIAGNOSIS — I110 Hypertensive heart disease with heart failure: Secondary | ICD-10-CM | POA: Insufficient documentation

## 2016-06-03 DIAGNOSIS — Z794 Long term (current) use of insulin: Secondary | ICD-10-CM | POA: Diagnosis not present

## 2016-06-03 DIAGNOSIS — C569 Malignant neoplasm of unspecified ovary: Secondary | ICD-10-CM | POA: Diagnosis not present

## 2016-06-03 DIAGNOSIS — I2510 Atherosclerotic heart disease of native coronary artery without angina pectoris: Secondary | ICD-10-CM | POA: Insufficient documentation

## 2016-06-04 NOTE — Progress Notes (Signed)
[name], [name] ([id number]) Visit Report for 06/03/2016 Chief Complaint Document Details Patient Name: [name], [name] Date of Service: 06/03/2016 10:30 AM Medical Record Number: [id number] Patient Account Number: [id number] Date of Birth/Sex: November 04, 1945 (70 y.o. Female) Treating RN: Baruch Gouty, RN, BSN, Velva Harman Primary Care Provider: Caryl Bis, ERIC Other Clinician: Referring Provider: Caryl Bis, ERIC Treating Provider/Extender: Frann Rider in Treatment: 22 Information Obtained from: Patient Chief Complaint Mrs. Wollen presents for evaluation of her left posterior heel pressure ulcer Electronic Signature(s) Signed: 06/03/2016 10:42:53 AM By: Christin Fudge MD, FACS Entered By: Christin Fudge on 06/03/2016 10:42:53 [name] ([id number]) -------------------------------------------------------------------------------- HPI Details Patient Name: [name] Date of Service: 06/03/2016 10:30 AM Medical Record Number: [id number] Patient Account Number: [id number] Date of Birth/Sex: 06-May-1945 (70 y.o. Female) Treating RN: Afful, RN, BSN, Velva Harman Primary Care Provider: Caryl Bis, ERIC Other Clinician: Referring Provider: Caryl Bis, ERIC Treating Provider/Extender: Frann Rider in Treatment: 22 History of Present Illness Location: right heel Quality: Patient reports experiencing a dull pain to affected area(s). Severity: Patient states wound are getting worse. Duration: Patient has had the wound for <5 months prior to presenting for treatment Timing: Pain in wound is Intermittent (comes and goes Context: The wound appeared gradually over time Modifying Factors: Consults to this date include: local care was done as per the medication given by the PCP which may have been medihoney Associated Signs and Symptoms: Patient reports having difficulty standing for long periods. 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 In the past Janet was seen for a decubitus ulcer on her right gluteal area. Janet is known to have diabetes mellitus type 1 and her most recent A1c was 7.2% Past medical history significant for leukopenia, cholelithiasis, hypertension, chronic diastolic CHF, coronary artery disease,type 1 diabetes mellitus, rheumatoid arthritis, collagen vascular disease, ovarian cancer, status post CABG in 2014 and status post Port-A-Cath insertion by Dr. Leotis Pain in October 2016. Janet has quit smoking about 20 years ago. Janet was advised to use Medihoney with calcium alginate pads to be applied over the wound. 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 01/07/2016 -- x-ray of the left heel done -- IMPRESSION: No radiographic evidence of osteomyelitis. If this remains a clinical concern, recommend MR imaging. 02/05/16: returns today for f/u. denies fever, chills, body aches or malaise. no interval changes regarding health status. 02/12/16: pt is doing well. wound has improved. no s/s of infection. no systemic s/s of infection. Janet reports will be on vacation next week, and requests appointment in 2 weeks. 03/18/2016 - is going to be starting her chemotherapy this coming Monday and will have cycles every 3 weeks 04-01-16 Mrs. Hoos, accompanied by her husband, presents for evaluation of her left posterior heel stage III pressure ulcer. Janet states that Janet started chemotherapy last week. Her chemotherapy is on a cycle of one week on 3 weeks off, with her next treatment  scheduled for December 11. There has been an increase in drainage since her last appointment. Janet denies any pain. They have been  using PolyMem silver rope. [name], [name] ([id number]) 04/22/16 - Mrs. Matsubara presents today, accompanied by her husband, for a violation of her left posterior low heel pressure ulcer. Janet continues to receive chemotherapy, with the exception of last week due to neutropenia. Her next scheduled chemotherapy is on January 2. Janet states that her chemotherapy schedule is 1 day on 8 days off. Janet and her husband deny any overt changes to her wound, denies pain, denies any increased drainage. 04/29/2016 -- the copayment for the skin substitute is about $300 a week and they have declined use of this material. Electronic Signature(s) Signed: 06/03/2016 10:42:59 AM By: Christin Fudge MD, FACS Entered By: Christin Fudge on 06/03/2016 10:42:58 [name] ([id number]) -------------------------------------------------------------------------------- Physical Exam Details Patient Name: [name] Date of Service: 06/03/2016 10:30 AM Medical Record Number: [id number] Patient Account Number: [id number] Date of Birth/Sex: 1946/04/21 (70 y.o. Female) Treating RN: Baruch Gouty, RN, BSN, Velva Harman Primary Care Provider: Caryl Bis, ERIC Other Clinician: Referring Provider: Caryl Bis, ERIC Treating Provider/Extender: Frann Rider in Treatment: 22 Constitutional . Pulse regular. Respirations normal and unlabored. Afebrile. . Eyes Nonicteric. Reactive to light. Ears, Nose, Mouth, and Throat Lips, teeth, and gums WNL.Marland Kitchen Moist mucosa without lesions. Neck supple and nontender. No palpable supraclavicular or cervical adenopathy. Normal sized without goiter. Respiratory WNL. No retractions.. Breath sounds WNL, No rubs, rales, rhonchi, or wheeze.. Cardiovascular Heart rhythm and rate regular, no murmur or gallop.. Pedal Pulses WNL. No clubbing, cyanosis or edema. Chest Breasts  symmetical and no nipple discharge.. Breast tissue WNL, no masses, lumps, or tenderness.. Lymphatic No adneopathy. No adenopathy. No adenopathy. Musculoskeletal Adexa without tenderness or enlargement.. Digits and nails w/o clubbing, cyanosis, infection, petechiae, ischemia, or inflammatory conditions.. Integumentary (Hair, Skin) No suspicious lesions. No crepitus or fluctuance. No peri-wound warmth or erythema. No masses.Marland Kitchen Psychiatric Judgement and insight Intact.. No evidence of depression, anxiety, or agitation.. Notes wound is looking very good it's fairly shallow and barely admits a fine probe Electronic Signature(s) Signed: 06/03/2016 10:43:23 AM By: Christin Fudge MD, FACS Entered By: Christin Fudge on 06/03/2016 10:43:22 [name] ([id number]) -------------------------------------------------------------------------------- Physician Orders Details Patient Name: [name] Date of Service: 06/03/2016 10:30 AM Medical Record Number: [id number] Patient Account Number: [id number] Date of Birth/Sex: 1946-02-03 (70 y.o. Female) Treating RN: Afful, RN, BSN, Velva Harman Primary Care Provider: Caryl Bis, ERIC Other Clinician: Referring Provider: Caryl Bis, ERIC Treating Provider/Extender: Frann Rider in Treatment: 22 Verbal / Phone Orders: No Diagnosis Coding Wound Cleansing Wound #2 Left Calcaneus o Clean wound with Normal Saline. Primary Wound Dressing Wound #2 Left Calcaneus o Iodoform packing Gauze Secondary Dressing Wound #2 Left Calcaneus o Dry Gauze - 2x2 o Boardered Foam Dressing Dressing Change Frequency Wound #2 Left Calcaneus o Change dressing every other day. Follow-up Appointments Wound #2 Left Calcaneus o Return Appointment in 1 week. Edema Control Wound #2 Left Calcaneus o Patient to wear own compression stockings - Patient instructed to wear 20-30 mmHg gradient o Elevate legs to the level of the heart and pump ankles as often as  possible o Support Garment 20-30 mm/Hg pressure to: Additional Orders / Instructions Wound #2 Left Calcaneus o Activity as tolerated Electronic Signature(s) Signed: 06/03/2016 2:17:37 PM By: Regan Lemming BSN, RN Signed: 06/03/2016 3:31:48 PM By: Christin Fudge MD, FACS [name], [name] ([id number]) Entered By: Regan Lemming on 06/03/2016 10:37:43 [name] ([id number]) -------------------------------------------------------------------------------- Problem List Details Patient Name: [name] Date of Service: 06/03/2016 10:30 AM Medical Record Number: [id number] Patient Account Number: [id number]  Date of Birth/Sex: [birth year] (71 y.o. Female) Treating RN: Baruch Gouty, RN, BSN, Velva Harman Primary Care Provider: Caryl Bis, ERIC Other Clinician: Referring Provider: Caryl Bis, ERIC Treating Provider/Extender: Frann Rider in Treatment: 22 Active Problems ICD-10 Encounter Code Description Active Date Diagnosis E10.621 Type 1 diabetes mellitus with foot ulcer 12/31/2015 Yes L89.623 Pressure ulcer of left heel, stage 3 12/31/2015 Yes Z92.21 Personal history of antineoplastic chemotherapy 12/31/2015 Yes Z79.52 Long term (current) use of systemic steroids 12/31/2015 Yes Inactive Problems Resolved Problems Electronic Signature(s) Signed: 06/03/2016 10:42:41 AM By: Christin Fudge MD, FACS Entered By: Christin Fudge on 06/03/2016 10:42:41 [name] ([id number]) -------------------------------------------------------------------------------- Progress Note Details Patient Name: [name] Date of Service: 06/03/2016 10:30 AM Medical Record Number: [id number] Patient Account Number: [id number] Date of Birth/Sex: 1946/05/01 (70 y.o. Female) Treating RN: Baruch Gouty, RN, BSN, Velva Harman Primary Care Provider: Caryl Bis, ERIC Other Clinician: Referring Provider: Caryl Bis, ERIC Treating Provider/Extender: Frann Rider in Treatment: 22 Subjective Chief Complaint Information  obtained from Patient Mrs. Killoran presents for evaluation of her left posterior heel pressure ulcer History of Present Illness (HPI) The following HPI elements were documented for the patient's wound: Location: right heel Quality: Patient reports experiencing a dull pain to affected area(s). Severity: Patient states wound are getting worse. Duration: Patient has had the wound for Timing: Pain in wound is Intermittent (comes and goes Context: The wound appeared gradually over time Modifying Factors: Consults to this date include: local care was done as per the medication given by the PCP which may have been medihoney Associated Signs and Symptoms: Patient reports having difficulty standing for long periods. 71 year old female with bilateral ovarian masses with associated peritoneal metastasis disease is on chemotherapy seen by as in February of this year and now returns with a left heel ulcer which Janet's had for about 5 months. In the past Janet was seen for a decubitus ulcer on her right gluteal area. Janet is known to have diabetes mellitus type 1 and her most recent A1c was 7.2% Past medical history significant for leukopenia, cholelithiasis, hypertension, chronic diastolic CHF, coronary artery disease,type 1 diabetes mellitus, rheumatoid arthritis, collagen vascular disease, ovarian cancer, status post CABG in 2014 and status post Port-A-Cath insertion by Dr. Leotis Pain in October 2016. Janet has quit smoking about 20 years ago. Janet was advised to use Medihoney with calcium alginate pads to be applied over the wound. 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 01/07/2016 -- x-ray of the left heel  done -- IMPRESSION: No radiographic evidence of osteomyelitis. If this remains a clinical concern, recommend MR imaging. 02/05/16: returns today for f/u. denies fever, chills, body aches or malaise. no interval changes regarding health status. 02/12/16: pt is doing well. wound has improved. no s/s of infection. no systemic s/s of infection. Janet reports will be on vacation next week, and requests appointment in 2 weeks. 03/18/2016 - is going to be starting her chemotherapy this coming Monday and will have cycles every 3 [name], [name]. ([id number]) weeks 04-01-16 Mrs. Coger, accompanied by her husband, presents for evaluation of her left posterior heel stage III pressure ulcer. Janet states that Janet started chemotherapy last week. Her chemotherapy is on a cycle of one week on 3  weeks off, with her next treatment scheduled for December 11. There has been an increase in drainage since her last appointment. Janet denies any pain. They have been using PolyMem silver rope. 04/22/16 - Mrs. Thurn presents today, accompanied by her husband, for a violation of her left posterior low heel pressure ulcer. Janet continues to receive chemotherapy, with the exception of last week due to neutropenia. Her next scheduled chemotherapy is on January 2. Janet states that her chemotherapy schedule is 1 day on 8 days off. Janet and her husband deny any overt changes to her wound, denies pain, denies any increased drainage. 04/29/2016 -- the copayment for the skin substitute is about $300 a week and they have declined use of this material. Objective Constitutional Pulse regular. Respirations normal and unlabored. Afebrile. Vitals Time Taken: 10:17 AM, Height: 63 in, Weight: 130 lbs, BMI: 23, Temperature: 97.7 F, Pulse: 75 bpm, Respiratory Rate: 16 breaths/min, Blood Pressure: 139/39 mmHg. Eyes Nonicteric. Reactive to light. Ears, Nose, Mouth, and Throat Lips, teeth, and gums WNL.Marland Kitchen Moist mucosa without  lesions. Neck supple and nontender. No palpable supraclavicular or cervical adenopathy. Normal sized without goiter. Respiratory WNL. No retractions.. Breath sounds WNL, No rubs, rales, rhonchi, or wheeze.. Cardiovascular Heart rhythm and rate regular, no murmur or gallop.. Pedal Pulses WNL. No clubbing, cyanosis or edema. Chest Breasts symmetical and no nipple discharge.. Breast tissue WNL, no masses, lumps, or tenderness.. Lymphatic No adneopathy. No adenopathy. No adenopathy. [name], [name] ([id number]) Musculoskeletal Adexa without tenderness or enlargement.. Digits and nails w/o clubbing, cyanosis, infection, petechiae, ischemia, or inflammatory conditions.Marland Kitchen Psychiatric Judgement and insight Intact.. No evidence of depression, anxiety, or agitation.. General Notes: wound is looking very good it's fairly shallow and barely admits a fine probe Integumentary (Hair, Skin) No suspicious lesions. No crepitus or fluctuance. No peri-wound warmth or erythema. No masses.. Wound #2 status is Open. Original cause of wound was Pressure Injury. The wound is located on the Left Calcaneus. The wound measures 0.3cm length x 0.3cm width x 0.6cm depth; 0.071cm^2 area and 0.042cm^3 volume. There is Fat Layer (Subcutaneous Tissue) Exposed exposed. There is no tunneling or undermining noted. There is a small amount of sanguinous drainage noted. The wound margin is flat and intact. There is medium (34-66%) pale granulation within the wound bed. There is no necrotic tissue within the wound bed. The periwound skin appearance exhibited: Callus. The periwound skin appearance did not exhibit: Crepitus, Excoriation, Induration, Rash, Scarring, Dry/Scaly, Maceration, Atrophie Blanche, Cyanosis, Ecchymosis, Hemosiderin Staining, Mottled, Pallor, Rubor, Erythema. Periwound temperature was noted as No Abnormality. The periwound has tenderness on palpation. Assessment Active Problems ICD-10 E10.621 - Type 1  diabetes mellitus with foot ulcer L89.623 - Pressure ulcer of left heel, stage 3 Z92.21 - Personal history of antineoplastic chemotherapy Z79.52 - Long term (current) use of systemic steroids Plan Wound Cleansing: Wound #2 Left Calcaneus: Clean wound with Normal Saline. Primary Wound Dressing: Wound #2 Left Calcaneus: Iodoform packing Gauze Secondary Dressing: [name], [name] ([id number]) Wound #2 Left Calcaneus: Dry Gauze - 2x2 Boardered Foam Dressing Dressing Change Frequency: Wound #2 Left Calcaneus: Change dressing every other day. Follow-up Appointments: Wound #2 Left Calcaneus: Return Appointment in 1 week. Edema Control: Wound #2 Left Calcaneus: Patient to wear own compression stockings - Patient instructed to wear 20-30 mmHg gradient Elevate legs to the level of the heart and pump ankles as often as possible Support Garment 20-30 mm/Hg pressure to: Additional Orders / Instructions: Wound #2 Left Calcaneus: Activity as tolerated I have  packed 1/4 inch iodoform gauze into the wound and applied an appropriate dressing over this. Janet will change it if required and see me back next week. 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 Nutrition, vitamins have also been discussed with her. Janet is still taking her chemotherapy currently. Electronic Signature(s) Signed: 06/03/2016 10:44:03 AM By: Christin Fudge MD, FACS Entered By: Christin Fudge on 06/03/2016 10:44:03 [name] ([id number]) -------------------------------------------------------------------------------- SuperBill Details Patient Name: [name] Date of Service: 06/03/2016 Medical Record Number: [id number] Patient Account Number: [id number] Date of Birth/Sex: 1946-01-11 (70 y.o. Female) Treating RN: Afful, RN, BSN, Velva Harman Primary Care Provider: Caryl Bis, ERIC Other Clinician: Referring Provider: Caryl Bis,  ERIC Treating Provider/Extender: Frann Rider in Treatment: 22 Diagnosis Coding ICD-10 Codes Code Description E10.621 Type 1 diabetes mellitus with foot ulcer L89.623 Pressure ulcer of left heel, stage 3 Z92.21 Personal history of antineoplastic chemotherapy Z79.52 Long term (current) use of systemic steroids Facility Procedures CPT4 Code: [id number] Description: [id number] - WOUND CARE VISIT-LEV 2 EST PT Modifier: Quantity: 1 Physician Procedures CPT4 Code: [id number] Description: [id number] - WC PHYS LEVEL 3 - EST PT ICD-10 Description Diagnosis E10.621 Type 1 diabetes mellitus with foot ulcer L89.623 Pressure ulcer of left heel, stage 3 Z92.21 Personal history of antineoplastic chemother Z79.52 Long term (current) use of  systemic steroids Modifier: apy Quantity: 1 Electronic Signature(s) Signed: 06/03/2016 11:51:17 AM By: Regan Lemming BSN, RN Signed: 06/03/2016 3:31:48 PM By: Christin Fudge MD, FACS Previous Signature: 06/03/2016 10:44:16 AM Version By: Christin Fudge MD, FACS Entered By: Regan Lemming on 06/03/2016 11:51:17

## 2016-06-04 NOTE — Progress Notes (Signed)
KIMIAH, COSENTINO ([id number]) Visit Report for 06/03/2016 Arrival Information Details Patient Name: [name], [name] Date of Service: 06/03/2016 10:30 AM Medical Record Number: [id number] Patient Account Number: [id number] Date of Birth/Sex: Feb 26, 1946 (71 y.o. Female) Treating RN: Afful, RN, BSN, Velva Harman Primary Care Ajia Chadderdon: Caryl Bis, ERIC Other Clinician: Referring Liberta Gimpel: Caryl Bis, ERIC Treating Rachal Dvorsky/Extender: Frann Rider in Treatment: 22 Visit Information History Since Last Visit All ordered tests and consults were completed: No Patient Arrived: Cane Added or deleted any medications: No Arrival Time: 10:16 Any new allergies or adverse reactions: No Accompanied By: hubby Had a fall or experienced change in No Transfer Assistance: None activities of daily living that may affect Patient Identification Verified: Yes risk of falls: Secondary Verification Process Yes Signs or symptoms of abuse/neglect since last No Completed: visito Patient Requires Transmission- No Has Dressing in Place as Prescribed: Yes Based Precautions: Pain Present Now: No Patient Has Alerts: Yes Patient Alerts: Patient on Blood Thinner eliquis BID DMII Electronic Signature(s) Signed: 06/03/2016 2:17:37 PM By: Regan Lemming BSN, RN Entered By: Regan Lemming on 06/03/2016 10:17:30 [name] ([id number]) -------------------------------------------------------------------------------- Clinic Level of Care Assessment Details Patient Name: [name] Date of Service: 06/03/2016 10:30 AM Medical Record Number: [id number] Patient Account Number: [id number] Date of Birth/Sex: 26-Jul-1945 (71 y.o. Female) Treating RN: Afful, RN, BSN, [place] Primary Care Rawlin Reaume: Caryl Bis, ERIC Other Clinician: Referring Isidor Bromell: Caryl Bis, ERIC Treating Mao Lockner/Extender: Frann Rider in Treatment: 22 Clinic Level of Care Assessment Items TOOL 4 Quantity Score []  - Use when only an EandM is  performed on FOLLOW-UP visit 0 ASSESSMENTS - Nursing Assessment / Reassessment X - Reassessment of Co-morbidities (includes updates in patient status) 1 10 X - Reassessment of Adherence to Treatment Plan 1 5 ASSESSMENTS - Wound and Skin Assessment / Reassessment X - Simple Wound Assessment / Reassessment - one wound 1 5 []  - Complex Wound Assessment / Reassessment - multiple wounds 0 []  - Dermatologic / Skin Assessment (not related to wound area) 0 ASSESSMENTS - Focused Assessment []  - Circumferential Edema Measurements - multi extremities 0 []  - Nutritional Assessment / Counseling / Intervention 0 X - Lower Extremity Assessment (monofilament, tuning fork, pulses) 1 5 []  - Peripheral Arterial Disease Assessment (using hand held doppler) 0 ASSESSMENTS - Ostomy and/or Continence Assessment and Care []  - Incontinence Assessment and Management 0 []  - Ostomy Care Assessment and Management (repouching, etc.) 0 PROCESS - Coordination of Care X - Simple Patient / Family Education for ongoing care 1 15 []  - Complex (extensive) Patient / Family Education for ongoing care 0 []  - Staff obtains Programmer, systems, Records, Test Results / Process Orders 0 []  - Staff telephones HHA, Nursing Homes / Clarify orders / etc 0 []  - Routine Transfer to another Facility (non-emergent condition) 0 [name], [name]. ([id number]) []  - Routine Hospital Admission (non-emergent condition) 0 []  - New Admissions / Biomedical engineer / Ordering NPWT, Apligraf, etc. 0 []  - Emergency Hospital Admission (emergent condition) 0 []  - Simple Discharge Coordination 0 []  - Complex (extensive) Discharge Coordination 0 PROCESS - Special Needs []  - Pediatric / Minor Patient Management 0 []  - Isolation Patient Management 0 []  - Hearing / Language / Visual special needs 0 []  - Assessment of Community assistance (transportation, D/C planning, etc.) 0 []  - Additional assistance / Altered mentation 0 []  - Support Surface(s) Assessment  (bed, cushion, seat, etc.) 0 INTERVENTIONS - Wound Cleansing / Measurement X - Simple Wound Cleansing - one wound 1 5 []  - Complex Wound Cleansing -  multiple wounds 0 X - Wound Imaging (photographs - any number of wounds) 1 5 []  - Wound Tracing (instead of photographs) 0 X - Simple Wound Measurement - one wound 1 5 []  - Complex Wound Measurement - multiple wounds 0 INTERVENTIONS - Wound Dressings X - Small Wound Dressing one or multiple wounds 1 10 []  - Medium Wound Dressing one or multiple wounds 0 []  - Large Wound Dressing one or multiple wounds 0 []  - Application of Medications - topical 0 []  - Application of Medications - injection 0 INTERVENTIONS - Miscellaneous []  - External ear exam 0 [name], [name]. ([id number]) []  - Specimen Collection (cultures, biopsies, blood, body fluids, etc.) 0 []  - Specimen(s) / Culture(s) sent or taken to Lab for analysis 0 []  - Patient Transfer (multiple staff / Harrel Lemon Lift / Similar devices) 0 []  - Simple Staple / Suture removal (25 or less) 0 []  - Complex Staple / Suture removal (26 or more) 0 []  - Hypo / Hyperglycemic Management (close monitor of Blood Glucose) 0 []  - Ankle / Brachial Index (ABI) - do not check if billed separately 0 X - Vital Signs 1 5 Has the patient been seen at the hospital within the last three years: Yes Total Score: 70 Level Of Care: New/Established - Level 2 Electronic Signature(s) Signed: 06/03/2016 2:17:37 PM By: Regan Lemming BSN, RN Entered By: Regan Lemming on 06/03/2016 11:16:46 [name] ([id number]) -------------------------------------------------------------------------------- Encounter Discharge Information Details Patient Name: [name] Date of Service: 06/03/2016 10:30 AM Medical Record Number: [id number] Patient Account Number: [id number] Date of Birth/Sex: [birth year] (71 y.o. Female) Treating RN: Baruch Gouty, RN, BSN, Velva Harman Primary Care [employer]: Caryl Bis, ERIC Other Clinician: Referring Tytionna Cloyd:  Caryl Bis, ERIC Treating Yulianna Folse/Extender: Frann Rider in Treatment: 22 Encounter Discharge Information Items Schedule Follow-up Appointment: No Medication Reconciliation completed No and provided to Patient/Care Shuronda Santino: Provided on Clinical Summary of Care: 06/03/2016 Form Type Recipient Paper Patient SR Electronic Signature(s) Signed: 06/03/2016 10:45:31 AM By: Ruthine Dose Entered By: Ruthine Dose on 06/03/2016 10:45:31 [name] ([id number]) -------------------------------------------------------------------------------- Lower Extremity Assessment Details Patient Name: [name] Date of Service: 06/03/2016 10:30 AM Medical Record Number: [id number] Patient Account Number: [id number] Date of Birth/Sex: 06-Feb-1946 (71 y.o. Female) Treating RN: Afful, RN, BSN, Velva Harman Primary Care Ephram Kornegay: Caryl Bis, ERIC Other Clinician: Referring Tywan Siever: Caryl Bis, ERIC Treating Quaneisha Hanisch/Extender: Frann Rider in Treatment: 22 Edema Assessment Assessed: [Left: No] [Right: No] Edema: [Left: N] [Right: o] Vascular Assessment Claudication: Claudication Assessment [Left:None] Pulses: Dorsalis Pedis Palpable: [Left:Yes] Posterior Tibial Extremity colors, hair growth, and conditions: Extremity Color: [Left:Normal] Hair Growth on Extremity: [Left:No] Temperature of Extremity: [Left:Warm] Capillary Refill: [Left:< 3 seconds] Toe Nail Assessment Left: Right: Thick: No Discolored: No Deformed: No Improper Length and Hygiene: No Electronic Signature(s) Signed: 06/03/2016 2:17:37 PM By: Regan Lemming BSN, RN Entered By: Regan Lemming on 06/03/2016 10:35:08 [name] ([id number]) -------------------------------------------------------------------------------- Multi Wound Chart Details Patient Name: [name] Date of Service: 06/03/2016 10:30 AM Medical Record Number: [id number] Patient Account Number: [id number] Date of Birth/Sex: Sep 22, 1945 (70 y.o.  Female) Treating RN: Baruch Gouty, RN, BSN, Velva Harman Primary Care Teressa Mcglocklin: Caryl Bis, ERIC Other Clinician: Referring Choua Ikner: Caryl Bis, ERIC Treating Brycelyn Gambino/Extender: Frann Rider in Treatment: 22 Vital Signs Height(in): 63 Pulse(bpm): 75 Weight(lbs): 130 Blood Pressure 139/39 (mmHg): Body Mass Index(BMI): 23 Temperature(F): 97.7 Respiratory Rate 16 (breaths/min): Photos: [2:No Photos] [N/A:N/A] Wound Location: [2:Left Calcaneus] [N/A:N/A] Wounding Event: [2:Pressure Injury] [N/A:N/A] Primary Etiology: [2:Pressure Ulcer] [N/A:N/A] Secondary Etiology: [2:Diabetic Wound/Ulcer of the Lower Extremity] [N/A:N/A]  Comorbid History: [2:Cataracts, Type I Diabetes, Rheumatoid Arthritis, Osteoarthritis, Neuropathy, Received Chemotherapy] [N/A:N/A] Date Acquired: [2:07/15/2015] [N/A:N/A] Weeks of Treatment: [2:22] [N/A:N/A] Wound Status: [2:Open] [N/A:N/A] Measurements L x W x D 0.3x0.3x0.6 [N/A:N/A] (cm) Area (cm) : [2:0.071] [N/A:N/A] Volume (cm) : [2:0.042] [N/A:N/A] % Reduction in Area: [2:69.90%] [N/A:N/A] % Reduction in Volume: 55.30% [N/A:N/A] Classification: [2:Category/Stage III] [N/A:N/A] HBO Classification: [2:Grade 1] [N/A:N/A] Exudate Amount: [2:Small] [N/A:N/A] Exudate Type: [2:Sanguinous] [N/A:N/A] Exudate Color: [2:red] [N/A:N/A] Wound Margin: [2:Flat and Intact] [N/A:N/A] Granulation Amount: [2:Medium (34-66%)] [N/A:N/A] Granulation Quality: [2:Pale] [N/A:N/A] Necrotic Amount: [2:None Present (0%)] [N/A:N/A] Exposed Structures: [N/A:N/A] Fat Layer (Subcutaneous Tissue) Exposed: Yes Fascia: No Tendon: No Muscle: No Joint: No Bone: No Epithelialization: Medium (34-66%) N/A N/A Periwound Skin Texture: Callus: Yes N/A N/A Excoriation: No Induration: No Crepitus: No Rash: No Scarring: No Periwound Skin Maceration: No N/A N/A Moisture: Dry/Scaly: No Periwound Skin Color: Atrophie Blanche: No N/A N/A Cyanosis: No Ecchymosis: No Erythema:  No Hemosiderin Staining: No Mottled: No Pallor: No Rubor: No Temperature: No Abnormality N/A N/A Tenderness on Yes N/A N/A Palpation: Wound Preparation: Ulcer Cleansing: N/A N/A Rinsed/Irrigated with Saline Topical Anesthetic Applied: Other: lidocaine 4% Treatment Notes Electronic Signature(s) Signed: 06/03/2016 10:42:48 AM By: Christin Fudge MD, FACS Entered By: Christin Fudge on 06/03/2016 10:42:48 [name] ([id number]) -------------------------------------------------------------------------------- Multi-Disciplinary Care Plan Details Patient Name: [name] Date of Service: 06/03/2016 10:30 AM Medical Record Number: [id number] Patient Account Number: [id number] Date of Birth/Sex: [birth year] (71 y.o. Female) Treating RN: Afful, RN, BSN, Velva Harman Primary Care Shakesha Soltau: Caryl Bis, ERIC Other Clinician: Referring Yara Tomkinson: Caryl Bis, ERIC Treating Sharron Petruska/Extender: Frann Rider in Treatment: 22 Active Inactive ` Orientation to the Wound Care Program Nursing Diagnoses: Knowledge deficit related to the wound healing center program Goals: Patient/caregiver will verbalize understanding of the [facility] Program Date Initiated: 12/31/2015 Target Resolution Date: 08/12/2016 Goal Status: Active Interventions: Provide education on orientation to the wound center Notes: ` Pressure Nursing Diagnoses: Knowledge deficit related to causes and risk factors for pressure ulcer development Knowledge deficit related to management of pressures ulcers Potential for impaired tissue integrity related to pressure, friction, moisture, and shear Goals: Patient will remain free from development of additional pressure ulcers Date Initiated: 12/31/2015 Target Resolution Date: 08/12/2016 Goal Status: Active Patient will remain free of pressure ulcers Date Initiated: 12/31/2015 Target Resolution Date: 08/12/2016 Goal Status: Active Patient/caregiver will verbalize risk  factors for pressure ulcer development Date Initiated: 12/31/2015 Target Resolution Date: 08/12/2016 Goal Status: Active Patient/caregiver will verbalize understanding of pressure ulcer management Date Initiated: 12/31/2015 Target Resolution Date: 08/12/2016 Goal Status: Active [name], [name] ([id number]) Interventions: Assess: immobility, friction, shearing, incontinence upon admission and as needed Assess offloading mechanisms upon admission and as needed Assess potential for pressure ulcer upon admission and as needed Provide education on pressure ulcers Treatment Activities: Patient referred for pressure reduction/relief devices : 12/31/2015 Notes: ` Wound/Skin Impairment Nursing Diagnoses: Impaired tissue integrity Knowledge deficit related to smoking impact on wound healing Knowledge deficit related to ulceration/compromised skin integrity Goals: Patient/caregiver will verbalize understanding of skin care regimen Date Initiated: 12/31/2015 Target Resolution Date: 08/12/2016 Goal Status: Active Ulcer/skin breakdown will have a volume reduction of 30% by week 4 Date Initiated: 12/31/2015 Target Resolution Date: 08/12/2016 Goal Status: Active Ulcer/skin breakdown will have a volume reduction of 50% by week 8 Date Initiated: 12/31/2015 Target Resolution Date: 08/12/2016 Goal Status: Active Ulcer/skin breakdown will have a volume reduction of 80% by week 12 Date Initiated: 12/31/2015 Target Resolution Date: 08/12/2016 Goal Status: Active  Ulcer/skin breakdown will heal within 14 weeks Date Initiated: 12/31/2015 Target Resolution Date: 08/12/2016 Goal Status: Active Interventions: Assess patient/caregiver ability to obtain necessary supplies Assess patient/caregiver ability to perform ulcer/skin care regimen upon admission and as needed Assess ulceration(s) every visit Provide education on ulcer and skin care Treatment Activities: Skin care regimen initiated : 12/31/2015 Topical  wound management initiated : 12/31/2015 [name], [name] ([id number]) Notes: Electronic Signature(s) Signed: 06/03/2016 2:17:37 PM By: Regan Lemming BSN, RN Entered By: Regan Lemming on 06/03/2016 10:36:52 [name] ([id number]) -------------------------------------------------------------------------------- Pain Assessment Details Patient Name: [name] Date of Service: 06/03/2016 10:30 AM Medical Record Number: [id number] Patient Account Number: [id number] Date of Birth/Sex: 1945-09-13 (71 y.o. Female) Treating RN: Afful, RN, BSN, Velva Harman Primary Care Karle Desrosier: Caryl Bis, ERIC Other Clinician: Referring Marialena Wollen: Caryl Bis, ERIC Treating Daxson Reffett/Extender: Frann Rider in Treatment: 22 Active Problems Location of Pain Severity and Description of Pain Patient Has Paino No Site Locations With Dressing Change: No Pain Management and Medication Current Pain Management: Electronic Signature(s) Signed: 06/03/2016 2:17:37 PM By: Regan Lemming BSN, RN Entered By: Regan Lemming on 06/03/2016 10:17:40 [name] ([id number]) -------------------------------------------------------------------------------- Wound Assessment Details Patient Name: [name] Date of Service: 06/03/2016 10:30 AM Medical Record Number: [id number] Patient Account Number: [id number] Date of Birth/Sex: Oct 29, 1945 (71 y.o. Female) Treating RN: Afful, RN, BSN, [employer] Primary Care Mellonie Guess: Caryl Bis, ERIC Other Clinician: Referring Lin Hackmann: Caryl Bis, ERIC Treating Shamicka Inga/Extender: Frann Rider in Treatment: 22 Wound Status Wound Number: 2 Primary Pressure Ulcer Etiology: Wound Location: Left Calcaneus Secondary Diabetic Wound/Ulcer of the Lower Wounding Event: Pressure Injury Etiology: Extremity Date Acquired: 07/15/2015 Wound Open Weeks Of Treatment: 22 Status: Clustered Wound: No Comorbid Cataracts, Type I Diabetes, History: Rheumatoid Arthritis,  Osteoarthritis, Neuropathy, Received Chemotherapy Photos Photo Uploaded By: Regan Lemming on 06/03/2016 12:48:53 Wound Measurements Length: (cm) 0.3 Width: (cm) 0.3 Depth: (cm) 0.6 Area: (cm) 0.071 Volume: (cm) 0.042 % Reduction in Area: 69.9% % Reduction in Volume: 55.3% Epithelialization: Medium (34-66%) Tunneling: No Undermining: No Wound Description 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 ([id number]) er Cleansing: No Wound Margin: Flat and Intact Exudate Amount: Small Exudate Type: Sanguinous Exudate Color: red Wound Bed Granulation Amount: Medium (34-66%) Exposed Structure Granulation Quality: Pale Fascia Exposed: No Necrotic Amount: None Present (0%) Fat Layer (Subcutaneous Tissue) Exposed: Yes Tendon Exposed: No Muscle Exposed: No Joint Exposed: No Bone Exposed: No Periwound Skin Texture Texture Color No Abnormalities Noted: No No Abnormalities Noted: No Callus: Yes Atrophie Blanche: No Crepitus: No Cyanosis: No Excoriation: No Ecchymosis: No Induration: No Erythema: No Rash: No Hemosiderin Staining: No Scarring: No Mottled: No Pallor: No Moisture Rubor: No No Abnormalities Noted: No Dry / Scaly: No Temperature / Pain Maceration: No Temperature: No Abnormality Tenderness on Palpation: Yes Wound Preparation Ulcer Cleansing: Rinsed/Irrigated with Saline Topical Anesthetic Applied: Other: lidocaine 4%, Electronic Signature(s) Signed: 06/03/2016 2:17:37 PM By: Regan Lemming BSN, RN Entered By: Regan Lemming on 06/03/2016 10:26:04 [name] ([id number]) -------------------------------------------------------------------------------- Vitals Details Patient Name: [name] Date of Service: 06/03/2016 10:30 AM Medical Record Number: [id number] Patient Account Number: [id number] Date of Birth/Sex: 1945/10/02 (71 y.o. Female) Treating RN: Afful, RN, BSN, [place] Primary Care  Osa Campoli: Caryl Bis, ERIC Other Clinician: Referring Itzayana Pardy: Caryl Bis, ERIC Treating Dashanti Burr/Extender: Frann Rider in Treatment: 22 Vital Signs Time Taken: 10:17 Temperature (F): 97.7 Height (in): 63 Pulse (bpm): 75 Weight (lbs): 130 Respiratory Rate (breaths/min): 16 Body Mass Index (BMI): 23 Blood Pressure (mmHg): 139/39 Reference  Range: 80 - 120 mg / dl Electronic Signature(s) Signed: 06/03/2016 2:17:37 PM By: Regan Lemming BSN, RN Entered By: Regan Lemming on 06/03/2016 10:20:15

## 2016-06-07 ENCOUNTER — Inpatient Hospital Stay: Payer: Medicare Other

## 2016-06-07 DIAGNOSIS — D72819 Decreased white blood cell count, unspecified: Secondary | ICD-10-CM

## 2016-06-07 DIAGNOSIS — C569 Malignant neoplasm of unspecified ovary: Secondary | ICD-10-CM | POA: Diagnosis not present

## 2016-06-07 DIAGNOSIS — D649 Anemia, unspecified: Secondary | ICD-10-CM

## 2016-06-07 LAB — CBC WITH DIFFERENTIAL/PLATELET
Basophils Absolute: 0 10*3/uL (ref 0–0.1)
Basophils Relative: 1 %
Eosinophils Absolute: 0 10*3/uL (ref 0–0.7)
Eosinophils Relative: 2 %
HCT: 26.2 % — ABNORMAL LOW (ref 35.0–47.0)
Hemoglobin: 9 g/dL — ABNORMAL LOW (ref 12.0–16.0)
Lymphocytes Relative: 31 %
Lymphs Abs: 0.7 10*3/uL — ABNORMAL LOW (ref 1.0–3.6)
MCH: 32.7 pg (ref 26.0–34.0)
MCHC: 34.4 g/dL (ref 32.0–36.0)
MCV: 95 fL (ref 80.0–100.0)
Monocytes Absolute: 0.4 10*3/uL (ref 0.2–0.9)
Monocytes Relative: 21 %
Neutro Abs: 0.9 10*3/uL — ABNORMAL LOW (ref 1.4–6.5)
Neutrophils Relative %: 45 %
Platelets: 111 10*3/uL — ABNORMAL LOW (ref 150–440)
RBC: 2.76 MIL/uL — ABNORMAL LOW (ref 3.80–5.20)
RDW: 19.5 % — ABNORMAL HIGH (ref 11.5–14.5)
WBC: 2.1 10*3/uL — ABNORMAL LOW (ref 3.6–11.0)

## 2016-06-07 LAB — BASIC METABOLIC PANEL
Anion gap: 6 (ref 5–15)
BUN: 11 mg/dL (ref 6–20)
CO2: 28 mmol/L (ref 22–32)
Calcium: 9.1 mg/dL (ref 8.9–10.3)
Chloride: 104 mmol/L (ref 101–111)
Creatinine, Ser: 0.71 mg/dL (ref 0.44–1.00)
GFR calc Af Amer: >60 mL/min (ref >60)
GFR calc non Af Amer: >60 mL/min (ref >60)
Glucose, Bld: 130 mg/dL — ABNORMAL HIGH (ref 65–99)
Potassium: 4 mmol/L (ref 3.5–5.1)
Sodium: 138 mmol/L (ref 135–145)

## 2016-06-07 LAB — MAGNESIUM: Magnesium: 1.3 mg/dL — ABNORMAL LOW (ref 1.7–2.4)

## 2016-06-07 MED ORDER — SODIUM CHLORIDE 0.9 % IV SOLN
INTRAVENOUS | Status: DC
  Administered 2016-06-07: 10:00:00 via INTRAVENOUS
  Filled 2016-06-07: qty 1000

## 2016-06-07 MED ORDER — MAGNESIUM SULFATE 4 GM/100ML IV SOLN
4.0000 g | Freq: Once | INTRAVENOUS | Status: AC
  Administered 2016-06-07: 4 g via INTRAVENOUS
  Filled 2016-06-07: qty 100

## 2016-06-07 MED ORDER — TBO-FILGRASTIM 300 MCG/0.5ML ~~LOC~~ SOSY
300.0000 ug | PREFILLED_SYRINGE | Freq: Once | SUBCUTANEOUS | Status: AC
  Administered 2016-06-07: 300 ug via SUBCUTANEOUS

## 2016-06-07 MED ORDER — HEPARIN SOD (PORK) LOCK FLUSH 100 UNIT/ML IV SOLN
INTRAVENOUS | Status: AC
  Filled 2016-06-07: qty 5

## 2016-06-07 MED ORDER — TBO-FILGRASTIM 300 MCG/0.5ML ~~LOC~~ SOSY
300.0000 ug | PREFILLED_SYRINGE | Freq: Once | SUBCUTANEOUS | Status: DC
Start: 2016-06-07 — End: 2017-06-02
  Filled 2016-06-07: qty 0.5

## 2016-06-07 MED ORDER — HEPARIN SOD (PORK) LOCK FLUSH 100 UNIT/ML IV SOLN
500.0000 [IU] | Freq: Once | INTRAVENOUS | Status: AC
  Administered 2016-06-07: 500 [IU] via INTRAVENOUS

## 2016-06-07 MED ORDER — FILGRASTIM 300 MCG/0.5ML IJ SOSY: 300.0000 ug | PREFILLED_SYRINGE | Freq: Once | INTRAMUSCULAR | Status: DC

## 2016-06-10 ENCOUNTER — Other Ambulatory Visit: Payer: Self-pay | Admitting: Hematology and Oncology

## 2016-06-10 ENCOUNTER — Ambulatory Visit
Admission: RE | Admit: 2016-06-10 | Discharge: 2016-06-10 | Disposition: A | Discharge status: Home / Self Care | Payer: Medicare Other | Source: Ambulatory Visit | Attending: Hematology and Oncology | Admitting: Hematology and Oncology

## 2016-06-10 ENCOUNTER — Inpatient Hospital Stay: Payer: Medicare Other

## 2016-06-10 ENCOUNTER — Encounter: Payer: Medicare Other | Admitting: Surgery

## 2016-06-10 DIAGNOSIS — I82C11 Acute embolism and thrombosis of right internal jugular vein: Secondary | ICD-10-CM

## 2016-06-10 DIAGNOSIS — I700 Atherosclerosis of aorta: Secondary | ICD-10-CM | POA: Diagnosis not present

## 2016-06-10 DIAGNOSIS — R188 Other ascites: Secondary | ICD-10-CM | POA: Diagnosis not present

## 2016-06-10 DIAGNOSIS — E10621 Type 1 diabetes mellitus with foot ulcer: Secondary | ICD-10-CM | POA: Diagnosis not present

## 2016-06-10 DIAGNOSIS — J90 Pleural effusion, not elsewhere classified: Secondary | ICD-10-CM | POA: Insufficient documentation

## 2016-06-10 DIAGNOSIS — N138 Other obstructive and reflux uropathy: Secondary | ICD-10-CM | POA: Diagnosis not present

## 2016-06-10 DIAGNOSIS — I639 Cerebral infarction, unspecified: Secondary | ICD-10-CM

## 2016-06-10 DIAGNOSIS — I2510 Atherosclerotic heart disease of native coronary artery without angina pectoris: Secondary | ICD-10-CM | POA: Insufficient documentation

## 2016-06-10 DIAGNOSIS — C801 Malignant (primary) neoplasm, unspecified: Secondary | ICD-10-CM

## 2016-06-10 DIAGNOSIS — C569 Malignant neoplasm of unspecified ovary: Secondary | ICD-10-CM

## 2016-06-10 DIAGNOSIS — C786 Secondary malignant neoplasm of retroperitoneum and peritoneum: Secondary | ICD-10-CM | POA: Diagnosis not present

## 2016-06-10 DIAGNOSIS — K449 Diaphragmatic hernia without obstruction or gangrene: Secondary | ICD-10-CM | POA: Insufficient documentation

## 2016-06-10 LAB — BASIC METABOLIC PANEL
Anion gap: 8 (ref 5–15)
BUN: 12 mg/dL (ref 6–20)
CO2: 27 mmol/L (ref 22–32)
Calcium: 9.2 mg/dL (ref 8.9–10.3)
Chloride: 99 mmol/L — ABNORMAL LOW (ref 101–111)
Creatinine, Ser: 0.53 mg/dL (ref 0.44–1.00)
GFR calc Af Amer: >60 mL/min (ref >60)
GFR calc non Af Amer: >60 mL/min (ref >60)
Glucose, Bld: 174 mg/dL — ABNORMAL HIGH (ref 65–99)
Potassium: 4 mmol/L (ref 3.5–5.1)
Sodium: 134 mmol/L — ABNORMAL LOW (ref 135–145)

## 2016-06-10 LAB — MAGNESIUM: Magnesium: 1.3 mg/dL — ABNORMAL LOW (ref 1.7–2.4)

## 2016-06-10 MED ORDER — HEPARIN SOD (PORK) LOCK FLUSH 100 UNIT/ML IV SOLN
500.0000 [IU] | Freq: Once | INTRAVENOUS | Status: AC
  Administered 2016-06-10: 500 [IU] via INTRAVENOUS

## 2016-06-10 MED ORDER — IOPAMIDOL (ISOVUE-300) INJECTION 61%
100.0000 mL | Freq: Once | INTRAVENOUS | Status: AC | PRN
  Administered 2016-06-10: 100 mL via INTRAVENOUS

## 2016-06-10 MED ORDER — MAGNESIUM SULFATE 4 GM/100ML IV SOLN
4.0000 g | Freq: Once | INTRAVENOUS | Status: AC
  Administered 2016-06-10: 4 g via INTRAVENOUS
  Filled 2016-06-10: qty 100

## 2016-06-10 MED ORDER — FILGRASTIM 300 MCG/0.5ML IJ SOSY: 300.0000 ug | PREFILLED_SYRINGE | Freq: Once | INTRAMUSCULAR | Status: DC

## 2016-06-10 MED ORDER — SODIUM CHLORIDE 0.9 % IV SOLN
INTRAVENOUS | Status: DC
  Administered 2016-06-10: 11:00:00 via INTRAVENOUS
  Filled 2016-06-10: qty 1000

## 2016-06-11 NOTE — Progress Notes (Signed)
[name], [name] ([id number]) Visit Report for 06/10/2016 Arrival Information Details Patient Name: [name], [name] Date of Service: 06/10/2016 3:00 PM Medical Record Number: [id number] Patient Account Number: [id number] Date of Birth/Sex: 11/12/1945 (70 y.o. Female) Treating RN: Afful, RN, BSN, Velva Harman Primary Care Juaquin Ludington: Caryl Bis, ERIC Other Clinician: Referring Lonald Troiani: Caryl Bis, ERIC Treating Allianna Beaubien/Extender: Frann Rider in Treatment: 23 Visit Information History Since Last Visit All ordered tests and consults were completed: No Patient Arrived: Cane Added or deleted any medications: No Arrival Time: 14:53 Any new allergies or adverse reactions: No Accompanied By: hubby Had a fall or experienced change in No Transfer Assistance: None activities of daily living that may affect Patient Identification Verified: Yes risk of falls: Secondary Verification Process Yes Signs or symptoms of abuse/neglect since last No Completed: visito Patient Requires Transmission- No Hospitalized since last visit: No Based Precautions: Has Dressing in Place as Prescribed: Yes Patient Has Alerts: Yes Pain Present Now: No Patient Alerts: Patient on Blood Thinner eliquis BID DMII Electronic Signature(s) Signed: 06/10/2016 3:41:55 PM By: Regan Lemming BSN, RN Entered By: Regan Lemming on 06/10/2016 14:54:02 [name] ([id number]) -------------------------------------------------------------------------------- Clinic Level of Care Assessment Details Patient Name: [name] Date of Service: 06/10/2016 3:00 PM Medical Record Number: [id number] Patient Account Number: [id number] Date of Birth/Sex: Jul 30, 1945 (70 y.o. Female) Treating RN: Afful, RN, BSN, [place] Primary Care Trayonna Bachmeier: Caryl Bis, ERIC Other Clinician: Referring Demetrios Byron: Caryl Bis, ERIC Treating Cherrill Scrima/Extender: Frann Rider in Treatment: 23 Clinic Level of Care Assessment Items TOOL 4 Quantity  Score []  - Use when only an EandM is performed on FOLLOW-UP visit 0 ASSESSMENTS - Nursing Assessment / Reassessment X - Reassessment of Co-morbidities (includes updates in patient status) 1 10 X - Reassessment of Adherence to Treatment Plan 1 5 ASSESSMENTS - Wound and Skin Assessment / Reassessment X - Simple Wound Assessment / Reassessment - one wound 1 5 []  - Complex Wound Assessment / Reassessment - multiple wounds 0 []  - Dermatologic / Skin Assessment (not related to wound area) 0 ASSESSMENTS - Focused Assessment []  - Circumferential Edema Measurements - multi extremities 0 []  - Nutritional Assessment / Counseling / Intervention 0 X - Lower Extremity Assessment (monofilament, tuning fork, pulses) 1 5 []  - Peripheral Arterial Disease Assessment (using hand held doppler) 0 ASSESSMENTS - Ostomy and/or Continence Assessment and Care []  - Incontinence Assessment and Management 0 []  - Ostomy Care Assessment and Management (repouching, etc.) 0 PROCESS - Coordination of Care X - Simple Patient / Family Education for ongoing care 1 15 []  - Complex (extensive) Patient / Family Education for ongoing care 0 []  - Staff obtains Programmer, systems, Records, Test Results / Process Orders 0 []  - Staff telephones HHA, Nursing Homes / Clarify orders / etc 0 []  - Routine Transfer to another Facility (non-emergent condition) 0 [name], [name] ([id number]) []  - Routine Hospital Admission (non-emergent condition) 0 []  - New Admissions / Biomedical engineer / Ordering NPWT, Apligraf, etc. 0 []  - Emergency Hospital Admission (emergent condition) 0 []  - Simple Discharge Coordination 0 []  - Complex (extensive) Discharge Coordination 0 PROCESS - Special Needs []  - Pediatric / Minor Patient Management 0 []  - Isolation Patient Management 0 []  - Hearing / Language / Visual special needs 0 []  - Assessment of Community assistance (transportation, D/C planning, etc.) 0 []  - Additional assistance / Altered mentation  0 []  - Support Surface(s) Assessment (bed, cushion, seat, etc.) 0 INTERVENTIONS - Wound Cleansing / Measurement X - Simple Wound Cleansing - one wound 1 5 []  -  Complex Wound Cleansing - multiple wounds 0 X - Wound Imaging (photographs - any number of wounds) 1 5 []  - Wound Tracing (instead of photographs) 0 X - Simple Wound Measurement - one wound 1 5 []  - Complex Wound Measurement - multiple wounds 0 INTERVENTIONS - Wound Dressings X - Small Wound Dressing one or multiple wounds 1 10 []  - Medium Wound Dressing one or multiple wounds 0 []  - Large Wound Dressing one or multiple wounds 0 []  - Application of Medications - topical 0 []  - Application of Medications - injection 0 INTERVENTIONS - Miscellaneous []  - External ear exam 0 [name], [name]. ([id number]) []  - Specimen Collection (cultures, biopsies, blood, body fluids, etc.) 0 []  - Specimen(s) / Culture(s) sent or taken to Lab for analysis 0 []  - Patient Transfer (multiple staff / Harrel Lemon Lift / Similar devices) 0 []  - Simple Staple / Suture removal (25 or less) 0 []  - Complex Staple / Suture removal (26 or more) 0 []  - Hypo / Hyperglycemic Management (close monitor of Blood Glucose) 0 []  - Ankle / Brachial Index (ABI) - do not check if billed separately 0 X - Vital Signs 1 5 Has the patient been seen at the hospital within the last three years: Yes Total Score: 70 Level Of Care: New/Established - Level 2 Electronic Signature(s) Signed: 06/10/2016 3:41:55 PM By: Regan Lemming BSN, RN Entered By: Regan Lemming on 06/10/2016 15:12:54 [name] ([id number]) -------------------------------------------------------------------------------- Encounter Discharge Information Details Patient Name: [name] Date of Service: 06/10/2016 3:00 PM Medical Record Number: [id number] Patient Account Number: [id number] Date of Birth/Sex: [birth year] (70 y.o. Female) Treating RN: Afful, RN, BSN, Velva Harman Primary Care Gwendolyne Welford: Caryl Bis,  ERIC Other Clinician: Referring Keatyn Luck: Caryl Bis, ERIC Treating Yariana Hoaglund/Extender: Frann Rider in Treatment: 23 Encounter Discharge Information Items Discharge Pain Level: 0 Discharge Condition: Stable Ambulatory Status: Cane Discharge Destination: Home Transportation: Private Auto Accompanied By: Micheline Rough Schedule Follow-up Appointment: No Medication Reconciliation completed No and provided to Patient/Care Poetry Cerro: Provided on Clinical Summary of Care: 06/10/2016 Form Type Recipient Paper Patient SR Electronic Signature(s) Signed: 06/10/2016 3:41:55 PM By: Regan Lemming BSN, RN Previous Signature: 06/10/2016 3:15:39 PM Version By: Ruthine Dose Entered By: Regan Lemming on 06/10/2016 15:21:41 [name] ([id number]) -------------------------------------------------------------------------------- Lower Extremity Assessment Details Patient Name: [name] Date of Service: 06/10/2016 3:00 PM Medical Record Number: [id number] Patient Account Number: [id number] Date of Birth/Sex: 1945/05/05 (70 y.o. Female) Treating RN: Afful, RN, BSN, Velva Harman Primary Care Amillion Scobee: Caryl Bis, ERIC Other Clinician: Referring Moxon Messler: Caryl Bis, ERIC Treating Lyriq Finerty/Extender: Frann Rider in Treatment: 23 Edema Assessment Assessed: [Left: No] [Right: No] Edema: [Left: N] [Right: o] Vascular Assessment Claudication: Claudication Assessment [Left:None] Pulses: Dorsalis Pedis Palpable: [Left:Yes] Posterior Tibial Extremity colors, hair growth, and conditions: Extremity Color: [Left:Normal] Hair Growth on Extremity: [Left:No] Temperature of Extremity: [Left:Warm] Capillary Refill: [Left:< 3 seconds] Electronic Signature(s) Signed: 06/10/2016 3:41:55 PM By: Regan Lemming BSN, RN Entered By: Regan Lemming on 06/10/2016 14:54:26 [name] ([id number]) -------------------------------------------------------------------------------- Multi Wound Chart Details Patient  Name: [name] Date of Service: 06/10/2016 3:00 PM Medical Record Number: [id number] Patient Account Number: [id number] Date of Birth/Sex: [birth year] (70 y.o. Female) Treating RN: Baruch Gouty, RN, BSN, Velva Harman Primary Care Mayla Biddy: Caryl Bis, ERIC Other Clinician: Referring Theadora Noyes: Caryl Bis, ERIC Treating Roux Brandy/Extender: Frann Rider in Treatment: 23 Vital Signs Height(in): 63 Pulse(bpm): 75 Weight(lbs): 130 Blood Pressure 122/46 (mmHg): Body Mass Index(BMI): 23 Temperature(F): 97.7 Respiratory Rate 17 (breaths/min): Photos: [2:No Photos] [N/A:N/A] Wound Location: [2:Left Calcaneus] [N/A:N/A] Wounding  Event: [2:Pressure Injury] [N/A:N/A] Primary Etiology: [2:Pressure Ulcer] [N/A:N/A] Secondary Etiology: [2:Diabetic Wound/Ulcer of the Lower Extremity] [N/A:N/A] Comorbid History: [2:Cataracts, Type I Diabetes, Rheumatoid Arthritis, Osteoarthritis, Neuropathy, Received Chemotherapy] [N/A:N/A] Date Acquired: [2:07/15/2015] [N/A:N/A] Weeks of Treatment: [2:23] [N/A:N/A] Wound Status: [2:Open] [N/A:N/A] Measurements L x W x D 0.3x0.4x0.9 [N/A:N/A] (cm) Area (cm) : [2:0.094] [N/A:N/A] Volume (cm) : [2:0.085] [N/A:N/A] % Reduction in Area: [2:60.20%] [N/A:N/A] % Reduction in Volume: 9.60% [N/A:N/A] Classification: [2:Category/Stage III] [N/A:N/A] HBO Classification: [2:Grade 1] [N/A:N/A] Exudate Amount: [2:Small] [N/A:N/A] Exudate Type: [2:Sanguinous] [N/A:N/A] Exudate Color: [2:red] [N/A:N/A] Wound Margin: [2:Flat and Intact] [N/A:N/A] Granulation Amount: [2:Medium (34-66%)] [N/A:N/A] Granulation Quality: [2:Pale] [N/A:N/A] Necrotic Amount: [2:None Present (0%)] [N/A:N/A] Exposed Structures: [N/A:N/A] Fat Layer (Subcutaneous Tissue) Exposed: Yes Fascia: No Tendon: No Muscle: No Joint: No Bone: No Epithelialization: Medium (34-66%) N/A N/A Periwound Skin Texture: Callus: Yes N/A N/A Excoriation: No Induration: No Crepitus: No Rash: No Scarring:  No Periwound Skin Maceration: No N/A N/A Moisture: Dry/Scaly: No Periwound Skin Color: Atrophie Blanche: No N/A N/A Cyanosis: No Ecchymosis: No Erythema: No Hemosiderin Staining: No Mottled: No Pallor: No Rubor: No Temperature: No Abnormality N/A N/A Tenderness on Yes N/A N/A Palpation: Wound Preparation: Ulcer Cleansing: N/A N/A Rinsed/Irrigated with Saline Topical Anesthetic Applied: Other: lidocaine 4% Treatment Notes Electronic Signature(s) Signed: 06/10/2016 3:20:05 PM By: Christin Fudge MD, FACS Entered By: Christin Fudge on 06/10/2016 15:20:05 [name] ([id number]) -------------------------------------------------------------------------------- [place] Details Patient Name: [name] Date of Service: 06/10/2016 3:00 PM Medical Record Number: [id number] Patient Account Number: [id number] Date of Birth/Sex: 11-Nov-1945 (70 y.o. Female) Treating RN: Afful, RN, BSN, Velva Harman Primary Care Allyn Bertoni: Caryl Bis, ERIC Other Clinician: Referring Terique Kawabata: Caryl Bis, ERIC Treating Adeliz Tonkinson/Extender: Frann Rider in Treatment: 80 Active Inactive ` Orientation to the Wound Care Program Nursing Diagnoses: Knowledge deficit related to the wound healing center program Goals: Patient/caregiver will verbalize understanding of the [facility] Program Date Initiated: 12/31/2015 Target Resolution Date: 08/12/2016 Goal Status: Active Interventions: Provide education on orientation to the wound center Notes: ` Pressure Nursing Diagnoses: Knowledge deficit related to causes and risk factors for pressure ulcer development Knowledge deficit related to management of pressures ulcers Potential for impaired tissue integrity related to pressure, friction, moisture, and shear Goals: Patient will remain free from development of additional pressure ulcers Date Initiated: 12/31/2015 Target Resolution Date: 08/12/2016 Goal Status:  Active Patient will remain free of pressure ulcers Date Initiated: 12/31/2015 Target Resolution Date: 08/12/2016 Goal Status: Active Patient/caregiver will verbalize risk factors for pressure ulcer development Date Initiated: 12/31/2015 Target Resolution Date: 08/12/2016 Goal Status: Active Patient/caregiver will verbalize understanding of pressure ulcer management Date Initiated: 12/31/2015 Target Resolution Date: 08/12/2016 Goal Status: Active [name], [name] ([id number]) Interventions: Assess: immobility, friction, shearing, incontinence upon admission and as needed Assess offloading mechanisms upon admission and as needed Assess potential for pressure ulcer upon admission and as needed Provide education on pressure ulcers Treatment Activities: Patient referred for pressure reduction/relief devices : 12/31/2015 Notes: ` Wound/Skin Impairment Nursing Diagnoses: Impaired tissue integrity Knowledge deficit related to smoking impact on wound healing Knowledge deficit related to ulceration/compromised skin integrity Goals: Patient/caregiver will verbalize understanding of skin care regimen Date Initiated: 12/31/2015 Target Resolution Date: 08/12/2016 Goal Status: Active Ulcer/skin breakdown will have a volume reduction of 30% by week 4 Date Initiated: 12/31/2015 Target Resolution Date: 08/12/2016 Goal Status: Active Ulcer/skin breakdown will have a volume reduction of 50% by week 8 Date Initiated: 12/31/2015 Target Resolution Date: 08/12/2016 Goal Status: Active Ulcer/skin breakdown will have  a volume reduction of 80% by week 12 Date Initiated: 12/31/2015 Target Resolution Date: 08/12/2016 Goal Status: Active Ulcer/skin breakdown will heal within 14 weeks Date Initiated: 12/31/2015 Target Resolution Date: 08/12/2016 Goal Status: Active Interventions: Assess patient/caregiver ability to obtain necessary supplies Assess patient/caregiver ability to perform ulcer/skin care regimen upon  admission and as needed Assess ulceration(s) every visit Provide education on ulcer and skin care Treatment Activities: Skin care regimen initiated : 12/31/2015 Topical wound management initiated : 12/31/2015 [name], [name] ([id number]) Notes: Electronic Signature(s) Signed: 06/10/2016 3:41:55 PM By: Regan Lemming BSN, RN Entered By: Regan Lemming on 06/10/2016 15:05:42 [name] ([id number]) -------------------------------------------------------------------------------- Pain Assessment Details Patient Name: [name] Date of Service: 06/10/2016 3:00 PM Medical Record Number: [id number] Patient Account Number: [id number] Date of Birth/Sex: [birth year] (70 y.o. Female) Treating RN: Afful, RN, BSN, Velva Harman Primary Care Alethea Terhaar: Caryl Bis, ERIC Other Clinician: Referring Kyah Buesing: Caryl Bis, ERIC Treating Shawnta Schlegel/Extender: Frann Rider in Treatment: 23 Active Problems Location of Pain Severity and Description of Pain Patient Has Paino No Site Locations With Dressing Change: No Pain Management and Medication Current Pain Management: Electronic Signature(s) Signed: 06/10/2016 3:41:55 PM By: Regan Lemming BSN, RN Entered By: Regan Lemming on 06/10/2016 14:54:09 [name] ([id number]) -------------------------------------------------------------------------------- Patient/Caregiver Education Details Patient Name: [name] Date of Service: 06/10/2016 3:00 PM Medical Record Number: [id number] Patient Account Number: [id number] Date of Birth/Gender: 1945/11/02 (70 y.o. Female) Treating RN: Baruch Gouty, RN, BSN, Velva Harman Primary Care Physician: Caryl Bis, ERIC Other Clinician: Referring Physician: Caryl Bis, ERIC Treating Physician/Extender: Frann Rider in Treatment: 1 Education Assessment Education Provided To: Patient Education Topics Provided Pressure: Methods: Explain/Verbal Responses: State content correctly Welcome To [facility]: Methods: Explain/Verbal Responses: State content correctly Wound/Skin Impairment: Methods: Explain/Verbal Responses: State content correctly Electronic Signature(s) Signed: 06/10/2016 3:41:55 PM By: Regan Lemming BSN, RN Entered By: Regan Lemming on 06/10/2016 15:21:56 [name] ([id number]) -------------------------------------------------------------------------------- Wound Assessment Details Patient Name: [name] Date of Service: 06/10/2016 3:00 PM Medical Record Number: [id number] Patient Account Number: [id number] Date of Birth/Sex: [birth year] (70 y.o. Female) Treating RN: Afful, RN, BSN, [employer] Primary Care Kahla Risdon: Caryl Bis, ERIC Other Clinician: Referring Shawntelle Ungar: Caryl Bis, ERIC Treating Arkie Tagliaferro/Extender: Frann Rider in Treatment: 23 Wound Status Wound Number: 2 Primary Pressure Ulcer Etiology: Wound Location: Left Calcaneus Secondary Diabetic Wound/Ulcer of the Lower Wounding Event: Pressure Injury Etiology: Extremity Date Acquired: 07/15/2015 Wound Open Weeks Of Treatment: 23 Status: Clustered Wound: No Comorbid Cataracts, Type I Diabetes, History: Rheumatoid Arthritis, Osteoarthritis, Neuropathy, Received Chemotherapy Wound Measurements Length: (cm) 0.3 Width: (cm) 0.4 Depth: (cm) 0.9 Area: (cm) 0.094 Volume: (cm) 0.085 % Reduction in Area: 60.2% % Reduction in Volume: 9.6% Epithelialization: Medium (34-66%) Tunneling: No Undermining: No Wound Description Classification: Category/Stage III Foul Odor Diabetic Severity (Wagner): Grade 1 Wound Margin: Flat and Intact Exudate Amount: Small Exudate Type: Sanguinous Exudate Color: red After Cleansing: No Wound Bed Granulation Amount: Medium (34-66%) Exposed Structure Granulation Quality: Pale Fascia Exposed: No Necrotic Amount: None Present (0%) Fat Layer (Subcutaneous Tissue) Exposed: Yes Tendon Exposed: No Muscle Exposed: No Joint Exposed: No Bone Exposed:  No Periwound Skin Texture Texture Color No Abnormalities Noted: No No Abnormalities Noted: No Callus: Yes Atrophie Blanche: No [name], [name] ([id number]) Crepitus: No Cyanosis: No Excoriation: No Ecchymosis: No Induration: No Erythema: No Rash: No Hemosiderin Staining: No Scarring: No Mottled: No Pallor: No Moisture Rubor: No No Abnormalities Noted: No Dry / Scaly: No Temperature / Pain Maceration: No Temperature: No Abnormality Tenderness on Palpation:  Yes Wound Preparation Ulcer Cleansing: Rinsed/Irrigated with Saline Topical Anesthetic Applied: Other: lidocaine 4%, Treatment Notes Wound #2 (Left Calcaneus) 1. Cleansed with: Clean wound with Normal Saline 4. Dressing Applied: Iodoform packing Gauze 5. Secondary Dressing Applied Bordered Foam Dressing Electronic Signature(s) Signed: 06/10/2016 3:41:55 PM By: Regan Lemming BSN, RN Entered By: Regan Lemming on 06/10/2016 15:01:02 [name] ([id number]) -------------------------------------------------------------------------------- [place] Details Patient Name: [name] Date of Service: 06/10/2016 3:00 PM Medical Record Number: [id number] Patient Account Number: [id number] Date of Birth/Sex: 06-Sep-1945 (70 y.o. Female) Treating RN: Afful, RN, BSN, [employer] Primary Care Adisa Litt: Caryl Bis, ERIC Other Clinician: Referring Tristina Sahagian: Caryl Bis, ERIC Treating Napolean Sia/Extender: Frann Rider in Treatment: 23 Vital Signs Time Taken: 15:01 Temperature (F): 97.7 Height (in): 63 Pulse (bpm): 75 Weight (lbs): 130 Respiratory Rate (breaths/min): 17 Body Mass Index (BMI): 23 Blood Pressure (mmHg): 122/46 Reference Range: 80 - 120 mg / dl Electronic Signature(s) Signed: 06/10/2016 3:41:55 PM By: Regan Lemming BSN, RN Entered By: Regan Lemming on 06/10/2016 15:01:39

## 2016-06-11 NOTE — Progress Notes (Signed)
[name] ([id number]) Visit Report for 06/10/2016 Chief Complaint Document Details Patient Name: [name], [name] Date of Service: 06/10/2016 3:00 PM Medical Record Number: [id number] Patient Account Number: [id number] Date of Birth/Sex: May 28, 1945 (70 y.o. Female) Treating RN: Baruch Gouty, RN, BSN, Velva Harman Primary Care Provider: Caryl Bis, ERIC Other Clinician: Referring Provider: Caryl Bis, ERIC Treating Provider/Extender: Frann Rider in Treatment: 23 Information Obtained from: Patient Chief Complaint [name] presents for evaluation of her left posterior heel pressure ulcer Electronic Signature(s) Signed: 06/10/2016 3:20:11 PM By: Christin Fudge MD, FACS Entered By: Christin Fudge on 06/10/2016 15:20:11 [name] ([id number]) -------------------------------------------------------------------------------- HPI Details Patient Name: [name] Date of Service: 06/10/2016 3:00 PM Medical Record Number: [id number] Patient Account Number: [id number] Date of Birth/Sex: [birth year] (70 y.o. Female) Treating RN: Afful, RN, BSN, Velva Harman Primary Care Provider: Caryl Bis, ERIC Other Clinician: Referring Provider: Caryl Bis, ERIC Treating Provider/Extender: Frann Rider in Treatment: 23 History of Present Illness Location: right heel Quality: Patient reports experiencing a dull pain to affected area(s). Severity: Patient states wound are getting worse. Duration: Patient has had the wound for <5 months prior to presenting for treatment Timing: Pain in wound is Intermittent (comes and goes Context: The wound appeared gradually over time Modifying Factors: Consults to this date include: local care was done as per the medication given by the PCP which may have been medihoney Associated Signs and Symptoms: Patient reports having difficulty standing for long periods. 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 In the past she was seen for a decubitus ulcer on her right gluteal area. She is known to have diabetes mellitus type 1 and her most recent A1c was 7.2% Past medical history significant for leukopenia, cholelithiasis, hypertension, chronic diastolic CHF, coronary artery disease,type 1 diabetes mellitus, rheumatoid arthritis, collagen vascular disease, ovarian cancer, status post CABG in 2014 and status post Port-A-Cath insertion by Dr. Leotis Pain in October 2016. She has quit smoking about 20 years ago. She was advised to use Medihoney with calcium alginate pads to be applied over the wound. 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 01/07/2016 -- x-ray of the left heel done -- IMPRESSION: No radiographic evidence of osteomyelitis. If this remains a clinical concern, recommend MR imaging. 02/05/16: returns today for f/u. denies fever, chills, body aches or malaise. no interval changes regarding health status. 02/12/16: pt is doing well. wound has improved. no s/s of infection. no systemic s/s of infection. She reports will be on vacation next week, and requests appointment in 2 weeks. 03/18/2016 - is going to be starting her chemotherapy this coming Monday and will have cycles every 3 weeks 04-01-16 [name], accompanied by her husband, presents for evaluation of her left posterior heel stage III pressure ulcer. She states that she started chemotherapy last week. Her chemotherapy is on a cycle of one week on 3 weeks off, with her next treatment  scheduled for December 11. There has been an increase in drainage since her last appointment. She denies any pain. They have been  using PolyMem silver rope. FREDRICK, MCBRYDE ([id number]) 04/22/16 - [name] presents today, accompanied by her husband, for a violation of her left posterior low heel pressure ulcer. She continues to receive chemotherapy, with the exception of last week due to neutropenia. Her next scheduled chemotherapy is on January 2. She states that her chemotherapy schedule is 1 day on 8 days off. She and her husband deny any overt changes to her wound, denies pain, denies any increased drainage. 04/29/2016 -- the copayment for the skin substitute is about $300 a week and they have declined use of this material. Electronic Signature(s) Signed: 06/10/2016 3:20:22 PM By: Christin Fudge MD, FACS Entered By: Christin Fudge on 06/10/2016 15:20:22 [name] ([id number]) -------------------------------------------------------------------------------- Physical Exam Details Patient Name: [name] Date of Service: 06/10/2016 3:00 PM Medical Record Number: [id number] Patient Account Number: [id number] Date of Birth/Sex: 1945/12/09 (70 y.o. Female) Treating RN: Baruch Gouty, RN, BSN, Velva Harman Primary Care Provider: Caryl Bis, ERIC Other Clinician: Referring Provider: Caryl Bis, ERIC Treating Provider/Extender: Frann Rider in Treatment: 23 Constitutional . Pulse regular. Respirations normal and unlabored. Afebrile. . Eyes Nonicteric. Reactive to light. Ears, Nose, Mouth, and Throat Lips, teeth, and gums WNL.Marland Kitchen Moist mucosa without lesions. Neck supple and nontender. No palpable supraclavicular or cervical adenopathy. Normal sized without goiter. Respiratory WNL. No retractions.. Breath sounds WNL, No rubs, rales, rhonchi, or wheeze.. Cardiovascular Heart rhythm and rate regular, no murmur or gallop.. Pedal Pulses WNL. No clubbing, cyanosis or edema. Chest Breasts  symmetical and no nipple discharge.. Breast tissue WNL, no masses, lumps, or tenderness.. Lymphatic No adneopathy. No adenopathy. No adenopathy. Musculoskeletal Adexa without tenderness or enlargement.. Digits and nails w/o clubbing, cyanosis, infection, petechiae, ischemia, or inflammatory conditions.. Integumentary (Hair, Skin) No suspicious lesions. No crepitus or fluctuance. No peri-wound warmth or erythema. No masses.Marland Kitchen Psychiatric Judgement and insight Intact.. No evidence of depression, anxiety, or agitation.. Notes there is good resolution of her depth of the wound and still very narrow and only admits a small piece of 1/4 inch iodoform cause Electronic Signature(s) Signed: 06/10/2016 3:21:07 PM By: Christin Fudge MD, FACS Entered By: Christin Fudge on 06/10/2016 15:21:07 [name] ([id number]) -------------------------------------------------------------------------------- Physician Orders Details Patient Name: [name] Date of Service: 06/10/2016 3:00 PM Medical Record Number: [id number] Patient Account Number: [id number] Date of Birth/Sex: [birth year] (70 y.o. Female) Treating RN: Afful, RN, BSN, Velva Harman Primary Care Provider: Caryl Bis, ERIC Other Clinician: Referring Provider: Caryl Bis, ERIC Treating Provider/Extender: Frann Rider in Treatment: 14 Verbal / Phone Orders: No Diagnosis Coding Wound Cleansing Wound #2 Left Calcaneus o Clean wound with Normal Saline. Primary Wound Dressing Wound #2 Left Calcaneus o Iodoform packing Gauze - 1/4 inch Secondary Dressing Wound #2 Left Calcaneus o Dry Gauze - 2x2 o Boardered Foam Dressing Dressing Change Frequency Wound #2 Left Calcaneus o Change dressing every other day. Follow-up Appointments Wound #2 Left Calcaneus o Return Appointment in 1 week. Edema Control Wound #2 Left Calcaneus o Patient to wear own compression stockings - Patient instructed to wear 20-30 mmHg gradient o  Elevate legs to the level of the heart and pump ankles as often as possible o Support Garment 20-30 mm/Hg pressure to: Additional Orders / Instructions Wound #2 Left Calcaneus o Activity as tolerated Electronic Signature(s) Signed: 06/10/2016 3:35:58 PM By: Christin Fudge MD, FACS Signed: 06/10/2016 3:41:55 PM By: Regan Lemming BSN, RN Mcqueen, Sofie Hartigan ([id number]) Entered By: Regan Lemming on 06/10/2016 15:12:27 [name] ([id number]) -------------------------------------------------------------------------------- Problem List Details Patient Name: RANIM, HERTLING.  Date of Service: 06/10/2016 3:00 PM Medical Record Number: [id number] Patient Account Number: [id number] Date of Birth/Sex: 06-28-1945 (70 y.o. Female) Treating RN: Baruch Gouty, RN, BSN, Velva Harman Primary Care Provider: Caryl Bis, ERIC Other Clinician: Referring Provider: Caryl Bis, ERIC Treating Provider/Extender: Frann Rider in Treatment: 23 Active Problems ICD-10 Encounter Code Description Active Date Diagnosis E10.621 Type 1 diabetes mellitus with foot ulcer 12/31/2015 Yes L89.623 Pressure ulcer of left heel, stage 3 12/31/2015 Yes Z92.21 Personal history of antineoplastic chemotherapy 12/31/2015 Yes Z79.52 Long term (current) use of systemic steroids 12/31/2015 Yes Inactive Problems Resolved Problems Electronic Signature(s) Signed: 06/10/2016 3:20:02 PM By: Christin Fudge MD, FACS Entered By: Christin Fudge on 06/10/2016 15:20:01 [name] ([id number]) -------------------------------------------------------------------------------- Progress Note Details Patient Name: [name] Date of Service: 06/10/2016 3:00 PM Medical Record Number: [id number] Patient Account Number: [id number] Date of Birth/Sex: 06/16/1945 (70 y.o. Female) Treating RN: Baruch Gouty, RN, BSN, Velva Harman Primary Care Provider: Caryl Bis, ERIC Other Clinician: Referring Provider: Caryl Bis, ERIC Treating Provider/Extender: Frann Rider  in Treatment: 23 Subjective Chief Complaint Information obtained from Patient Mrs. Mares presents for evaluation of her left posterior heel pressure ulcer History of Present Illness (HPI) The following HPI elements were documented for the patient's wound: Location: right heel Quality: Patient reports experiencing a dull pain to affected area(s). Severity: Patient states wound are getting worse. Duration: Patient has had the wound for Timing: Pain in wound is Intermittent (comes and goes Context: The wound appeared gradually over time Modifying Factors: Consults to this date include: local care was done as per the medication given by the PCP which may have been medihoney Associated Signs and Symptoms: Patient reports having difficulty standing for long periods. 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 In the past she was seen for a decubitus ulcer on her right gluteal area. She is known to have diabetes mellitus type 1 and her most recent A1c was 7.2% Past medical history significant for leukopenia, cholelithiasis, hypertension, chronic diastolic CHF, coronary artery disease,type 1 diabetes mellitus, rheumatoid arthritis, collagen vascular disease, ovarian cancer, status post CABG in 2014 and status post Port-A-Cath insertion by Dr. Leotis Pain in October 2016. She has quit smoking about 20 years ago. She was advised to use Medihoney with calcium alginate pads to be applied over the wound. 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 01/07/2016 -- x-ray of the left heel done -- IMPRESSION: No radiographic evidence of osteomyelitis. If this remains a clinical concern, recommend MR imaging. 02/05/16: returns today for f/u. denies fever, chills, body aches or malaise. no interval changes regarding health status. 02/12/16: pt is doing well. wound has improved. no s/s of infection. no systemic s/s of infection. She reports will be on vacation next week, and requests appointment in 2 weeks. 03/18/2016 - is going to be starting her chemotherapy this coming Monday and will have cycles every 3 KAMBRYA, TOAL. ([id number]) weeks 04-01-16 Mrs. Donahey, accompanied by her husband, presents for evaluation of her left posterior heel stage III pressure ulcer. She states that she started  chemotherapy last week. Her chemotherapy is on a cycle of one week on 3 weeks off, with her next treatment scheduled for December 11. There has been an increase in drainage since her last appointment. She denies any pain. They have been using PolyMem silver rope. 04/22/16 - Mrs. Rosemeyer presents today, accompanied by her husband, for a violation of her left posterior low heel pressure ulcer. She continues to receive chemotherapy, with the exception of last week due to neutropenia. Her next scheduled chemotherapy is on January 2. She states that her chemotherapy schedule is 1 day on 8 days off. She and her husband deny any overt changes to her wound, denies pain, denies any increased drainage. 04/29/2016 -- the copayment for the skin substitute is about $300 a week and they have declined use of this material. Objective Constitutional Pulse regular. Respirations normal and unlabored. Afebrile. Vitals Time Taken: 3:01 PM, Height: 63 in, Weight: 130 lbs, BMI: 23, Temperature: 97.7 F, Pulse: 75 bpm, Respiratory Rate: 17 breaths/min, Blood Pressure: 122/46 mmHg. Eyes Nonicteric. Reactive to light. Ears, Nose, Mouth, and  Throat Lips, teeth, and gums WNL.Marland Kitchen Moist mucosa without lesions. Neck supple and nontender. No palpable supraclavicular or cervical adenopathy. Normal sized without goiter. Respiratory WNL. No retractions.. Breath sounds WNL, No rubs, rales, rhonchi, or wheeze.. Cardiovascular Heart rhythm and rate regular, no murmur or gallop.. Pedal Pulses WNL. No clubbing, cyanosis or edema. Chest Breasts symmetical and no nipple discharge.. Breast tissue WNL, no masses, lumps, or tenderness.. Lymphatic No adneopathy. No adenopathy. No adenopathy. SHIRAH, LIETO ([id number]) Musculoskeletal Adexa without tenderness or enlargement.. Digits and nails w/o clubbing, cyanosis, infection, petechiae, ischemia, or inflammatory conditions.Marland Kitchen Psychiatric Judgement and insight Intact.. No evidence of depression, anxiety, or agitation.. General Notes: there is good resolution of her depth of the wound and still very narrow and only admits a small piece of 1/4 inch iodoform cause Integumentary (Hair, Skin) No suspicious lesions. No crepitus or fluctuance. No peri-wound warmth or erythema. No masses.. Wound #2 status is Open. Original cause of wound was Pressure Injury. The wound is located on the Left Calcaneus. The wound measures 0.3cm length x 0.4cm width x 0.9cm depth; 0.094cm^2 area and 0.085cm^3 volume. There is Fat Layer (Subcutaneous Tissue) Exposed exposed. There is no tunneling or undermining noted. There is a small amount of sanguinous drainage noted. The wound margin is flat and intact. There is medium (34-66%) pale granulation within the wound bed. There is no necrotic tissue within the wound bed. The periwound skin appearance exhibited: Callus. The periwound skin appearance did not exhibit: Crepitus, Excoriation, Induration, Rash, Scarring, Dry/Scaly, Maceration, Atrophie Blanche, Cyanosis, Ecchymosis, Hemosiderin Staining, Mottled, Pallor, Rubor, Erythema. Periwound temperature was noted as No  Abnormality. The periwound has tenderness on palpation. Assessment Active Problems ICD-10 E10.621 - Type 1 diabetes mellitus with foot ulcer L89.623 - Pressure ulcer of left heel, stage 3 Z92.21 - Personal history of antineoplastic chemotherapy Z79.52 - Long term (current) use of systemic steroids Plan Wound Cleansing: Wound #2 Left Calcaneus: Clean wound with Normal Saline. Primary Wound Dressing: Wound #2 Left Calcaneus: Iodoform packing Gauze - 1/4 inch NINON, MAROLF ([id number]) Secondary Dressing: Wound #2 Left Calcaneus: Dry Gauze - 2x2 Boardered Foam Dressing Dressing Change Frequency: Wound #2 Left Calcaneus: Change dressing every other day. Follow-up Appointments: Wound #2 Left Calcaneus: Return Appointment in 1 week. Edema Control: Wound #2 Left Calcaneus: Patient to wear own compression stockings - Patient instructed to wear 20-30 mmHg gradient Elevate legs to the level of  the heart and pump ankles as often as possible Support Garment 20-30 mm/Hg pressure to: Additional Orders / Instructions: Wound #2 Left Calcaneus: Activity as tolerated I have packed 1/4 inch iodoform gauze into the wound and applied an appropriate dressing over this. She will change it daily or every other day She has a bit of lower extremity lymphedema and I was asked her to use compression stockings and to be careful about offloading. Nutrition, vitamins have also been discussed with her. She is still taking her chemotherapy currently. Electronic Signature(s) Signed: 06/10/2016 3:22:13 PM By: Christin Fudge MD, FACS Entered By: Christin Fudge on 06/10/2016 15:22:13 LEWANDA, BIGA ([id number]) -------------------------------------------------------------------------------- SuperBill Details Patient Name: [name] Date of Service: 06/10/2016 Medical Record Number: [id number] Patient Account Number: [id number] Date of Birth/Sex: Apr 20, 1946 (70 y.o. Female) Treating RN: Afful, RN,  BSN, Velva Harman Primary Care Provider: Caryl Bis, ERIC Other Clinician: Referring Provider: Caryl Bis, ERIC Treating Provider/Extender: Christin Fudge Service Line: Outpatient Weeks in Treatment: 23 Diagnosis Coding ICD-10 Codes Code Description E10.621 Type 1 diabetes mellitus with foot ulcer L89.623 Pressure ulcer of left heel, stage 3 Z92.21 Personal history of antineoplastic chemotherapy Z79.52 Long term (current) use of systemic steroids Facility Procedures CPT4 Code: [id number] Description: [phone] - WOUND CARE VISIT-LEV 2 EST PT Modifier: Quantity: 1 Physician Procedures CPT4 Code: [id number] Description: [id number] - WC PHYS LEVEL 3 - EST PT ICD-10 Description Diagnosis E10.621 Type 1 diabetes mellitus with foot ulcer L89.623 Pressure ulcer of left heel, stage 3 Z92.21 Personal history of antineoplastic chemother Z79.52 Long term (current) use of  systemic steroids Modifier: apy Quantity: 1 Electronic Signature(s) Signed: 06/10/2016 3:22:27 PM By: Christin Fudge MD, FACS Entered By: Christin Fudge on 06/10/2016 15:22:27

## 2016-06-14 ENCOUNTER — Inpatient Hospital Stay (HOSPITAL_BASED_OUTPATIENT_CLINIC_OR_DEPARTMENT_OTHER): Payer: Medicare Other | Admitting: Hematology and Oncology

## 2016-06-14 ENCOUNTER — Inpatient Hospital Stay: Payer: Medicare Other

## 2016-06-14 VITALS — BP 116/63 | HR 86 | Temp 95.8°F | Resp 18 | Wt 138.9 lb

## 2016-06-14 DIAGNOSIS — R918 Other nonspecific abnormal finding of lung field: Secondary | ICD-10-CM

## 2016-06-14 DIAGNOSIS — Z7901 Long term (current) use of anticoagulants: Secondary | ICD-10-CM

## 2016-06-14 DIAGNOSIS — C786 Secondary malignant neoplasm of retroperitoneum and peritoneum: Secondary | ICD-10-CM

## 2016-06-14 DIAGNOSIS — Z7952 Long term (current) use of systemic steroids: Secondary | ICD-10-CM

## 2016-06-14 DIAGNOSIS — I2510 Atherosclerotic heart disease of native coronary artery without angina pectoris: Secondary | ICD-10-CM

## 2016-06-14 DIAGNOSIS — N1330 Unspecified hydronephrosis: Secondary | ICD-10-CM

## 2016-06-14 DIAGNOSIS — I82C11 Acute embolism and thrombosis of right internal jugular vein: Secondary | ICD-10-CM

## 2016-06-14 DIAGNOSIS — C569 Malignant neoplasm of unspecified ovary: Secondary | ICD-10-CM

## 2016-06-14 DIAGNOSIS — E785 Hyperlipidemia, unspecified: Secondary | ICD-10-CM

## 2016-06-14 DIAGNOSIS — I252 Old myocardial infarction: Secondary | ICD-10-CM

## 2016-06-14 DIAGNOSIS — E876 Hypokalemia: Secondary | ICD-10-CM

## 2016-06-14 DIAGNOSIS — D638 Anemia in other chronic diseases classified elsewhere: Secondary | ICD-10-CM

## 2016-06-14 DIAGNOSIS — Z9641 Presence of insulin pump (external) (internal): Secondary | ICD-10-CM

## 2016-06-14 DIAGNOSIS — Z9049 Acquired absence of other specified parts of digestive tract: Secondary | ICD-10-CM

## 2016-06-14 DIAGNOSIS — J90 Pleural effusion, not elsewhere classified: Secondary | ICD-10-CM

## 2016-06-14 DIAGNOSIS — Z87891 Personal history of nicotine dependence: Secondary | ICD-10-CM

## 2016-06-14 DIAGNOSIS — Z90722 Acquired absence of ovaries, bilateral: Secondary | ICD-10-CM

## 2016-06-14 DIAGNOSIS — M069 Rheumatoid arthritis, unspecified: Secondary | ICD-10-CM

## 2016-06-14 DIAGNOSIS — I998 Other disorder of circulatory system: Secondary | ICD-10-CM

## 2016-06-14 DIAGNOSIS — K222 Esophageal obstruction: Secondary | ICD-10-CM

## 2016-06-14 DIAGNOSIS — R188 Other ascites: Secondary | ICD-10-CM

## 2016-06-14 DIAGNOSIS — I639 Cerebral infarction, unspecified: Secondary | ICD-10-CM

## 2016-06-14 DIAGNOSIS — Z86718 Personal history of other venous thrombosis and embolism: Secondary | ICD-10-CM

## 2016-06-14 DIAGNOSIS — K449 Diaphragmatic hernia without obstruction or gangrene: Secondary | ICD-10-CM

## 2016-06-14 DIAGNOSIS — Z90710 Acquired absence of both cervix and uterus: Secondary | ICD-10-CM

## 2016-06-14 DIAGNOSIS — Z7689 Persons encountering health services in other specified circumstances: Secondary | ICD-10-CM

## 2016-06-14 DIAGNOSIS — Z808 Family history of malignant neoplasm of other organs or systems: Secondary | ICD-10-CM

## 2016-06-14 DIAGNOSIS — I10 Essential (primary) hypertension: Secondary | ICD-10-CM

## 2016-06-14 DIAGNOSIS — Z801 Family history of malignant neoplasm of trachea, bronchus and lung: Secondary | ICD-10-CM

## 2016-06-14 DIAGNOSIS — I5032 Chronic diastolic (congestive) heart failure: Secondary | ICD-10-CM

## 2016-06-14 DIAGNOSIS — D72819 Decreased white blood cell count, unspecified: Secondary | ICD-10-CM

## 2016-06-14 DIAGNOSIS — Z79899 Other long term (current) drug therapy: Secondary | ICD-10-CM

## 2016-06-14 DIAGNOSIS — K219 Gastro-esophageal reflux disease without esophagitis: Secondary | ICD-10-CM

## 2016-06-14 DIAGNOSIS — Z794 Long term (current) use of insulin: Secondary | ICD-10-CM

## 2016-06-14 DIAGNOSIS — Z8673 Personal history of transient ischemic attack (TIA), and cerebral infarction without residual deficits: Secondary | ICD-10-CM

## 2016-06-14 DIAGNOSIS — E108 Type 1 diabetes mellitus with unspecified complications: Secondary | ICD-10-CM

## 2016-06-14 DIAGNOSIS — C801 Malignant (primary) neoplasm, unspecified: Secondary | ICD-10-CM

## 2016-06-14 LAB — CBC WITH DIFFERENTIAL/PLATELET
Basophils Absolute: 0 10*3/uL (ref 0–0.1)
Basophils Relative: 1 %
Eosinophils Absolute: 0 10*3/uL (ref 0–0.7)
Eosinophils Relative: 2 %
HCT: 26.4 % — ABNORMAL LOW (ref 35.0–47.0)
Hemoglobin: 9.3 g/dL — ABNORMAL LOW (ref 12.0–16.0)
Lymphocytes Relative: 26 %
Lymphs Abs: 0.6 10*3/uL — ABNORMAL LOW (ref 1.0–3.6)
MCH: 33.5 pg (ref 26.0–34.0)
MCHC: 35 g/dL (ref 32.0–36.0)
MCV: 95.6 fL (ref 80.0–100.0)
Monocytes Absolute: 0.5 10*3/uL (ref 0.2–0.9)
Monocytes Relative: 20 %
Neutro Abs: 1.2 10*3/uL — ABNORMAL LOW (ref 1.4–6.5)
Neutrophils Relative %: 51 %
Platelets: 202 10*3/uL (ref 150–440)
RBC: 2.77 MIL/uL — ABNORMAL LOW (ref 3.80–5.20)
RDW: 20.5 % — ABNORMAL HIGH (ref 11.5–14.5)
WBC: 2.3 10*3/uL — ABNORMAL LOW (ref 3.6–11.0)

## 2016-06-14 LAB — COMPREHENSIVE METABOLIC PANEL
ALT: 17 U/L (ref 14–54)
AST: 22 U/L (ref 15–41)
Albumin: 3.4 g/dL — ABNORMAL LOW (ref 3.5–5.0)
Alkaline Phosphatase: 63 U/L (ref 38–126)
Anion gap: 7 (ref 5–15)
BUN: 18 mg/dL (ref 6–20)
CO2: 27 mmol/L (ref 22–32)
Calcium: 8.8 mg/dL — ABNORMAL LOW (ref 8.9–10.3)
Chloride: 100 mmol/L — ABNORMAL LOW (ref 101–111)
Creatinine, Ser: 0.82 mg/dL (ref 0.44–1.00)
GFR calc Af Amer: >60 mL/min (ref >60)
GFR calc non Af Amer: >60 mL/min (ref >60)
Glucose, Bld: 238 mg/dL — ABNORMAL HIGH (ref 65–99)
Potassium: 4.2 mmol/L (ref 3.5–5.1)
Sodium: 134 mmol/L — ABNORMAL LOW (ref 135–145)
Total Bilirubin: 0.5 mg/dL (ref 0.3–1.2)
Total Protein: 6.9 g/dL (ref 6.5–8.1)

## 2016-06-14 LAB — MAGNESIUM: Magnesium: 1.3 mg/dL — ABNORMAL LOW (ref 1.7–2.4)

## 2016-06-14 MED ORDER — HEPARIN SOD (PORK) LOCK FLUSH 100 UNIT/ML IV SOLN
500.0000 [IU] | Freq: Once | INTRAVENOUS | Status: AC | PRN
  Administered 2016-06-14: 500 [IU]
  Filled 2016-06-14: qty 5

## 2016-06-14 MED ORDER — MAGNESIUM SULFATE 4 GM/100ML IV SOLN
4.0000 g | Freq: Once | INTRAVENOUS | Status: AC
  Administered 2016-06-14: 4 g via INTRAVENOUS
  Filled 2016-06-14: qty 100

## 2016-06-14 MED ORDER — SODIUM CHLORIDE 0.9 % IV SOLN
Freq: Once | INTRAVENOUS | Status: AC
  Administered 2016-06-14: 10:00:00 via INTRAVENOUS
  Filled 2016-06-14: qty 1000

## 2016-06-14 NOTE — Progress Notes (Signed)
[facility] day:  06/14/2016   Chief Complaint: [name] is a 71 y.o. female with stage IV ovarian cancer who is seen for review of interval staging studies and assessment prior to cycle #5 carboplatin and gemcitabine.  HPI:  The patient was last seen in the medical oncology clinic on 05/31/2016.  At that time, she was on day 8 of cycle #4 carboplatin and gemcitabine.  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  We discussed switch from Eliquis to Lovenox.  Neosho remained marginal.  She received GCSF on 05/30/2016, 05/31/2016.  ANC was 900 on 06/07/2016.  She received GCSF.   At last visit, we discussed restaging studies.  Abdomen and pelvic CT scan on 06/10/2016 revealed progression of peritoneal carcinomatosis predominantly in the perihepatic space, bilateral lower quadrants and pelvis.  There was worsening bilateral obstructive uropathy due to malignant involvement of the pelvic ureters.  There was stable small pleural/subpleural nodules at the right lung base.  There was stable small left pleural effusion.  There was decreased small volume perihepatic ascites.  Symptomatically, she denies any pain.  She denies any change in urine output.  She notes that her eye sight is improving.    Past Medical History:  Diagnosis Date  . CAD (coronary artery disease)    a. 09/2012 Cath: LM nl, LAD 95p, 74m LCX 924mOM2 50, RCA 100.  . Marland Kitchenholelithiasis   . Chronic diastolic CHF (congestive heart failure) ([facility]  . Collagen vascular disease (HCL[facility] [place]  . Esophageal stricture   . Exertional shortness of breath   . GERD (gastroesophageal reflux disease)   . H/O hiatal hernia   . Herniated disc   . History of pancytopenia   . Hyperlipidemia   . Hypertension   . Hypokalemia   . Leukopenia 2012   s/p bone marrow biopsy, Dr. PaMa Hillock. NSTEMI (non-ST elevated myocardial infarction) (HCHartline5/2014   "mild" (10/18/2012)  . Ovarian cancer (HCPinon Hills2016   chemo  .  Pneumonia 2013; 08/2012   "one lung; double" (10/18/2012)  . Rheumatoid arthritis(714.0)   . Type I diabetes mellitus (HC[facility]bal[facility]   "dx'd in 1957" (10/18/2012)    Past Surgical History:  Procedure Laterality Date  . ABDOMINAL HYSTERECTOMY  06/15/2015   Dx L/S, EXLAP TAH BSO omentectomy RSRx colostomy diaphragm resection stripping  . CARDIAC CATHETERIZATION  10/18/2012   "first one was today" (10/18/2012)  . CATARACT EXTRACTION W/ INTRAOCULAR LENS IMPLANT Right 2010  . CHOLECYSTECTOMY  06/15/2015   combined case with ovarian cancer debulking  . COLON SURGERY    . CORONARY ARTERY BYPASS GRAFT N/A 10/19/2012   Procedure: CORONARY ARTERY BYPASS GRAFTING (CABG);  Surgeon: StMelrose NakayamaMD;  [facility] Service: Open Heart Surgery;  Laterality: N/A;  . ESOPHAGEAL DILATION     "3 or 4 times" (10/19/2011)  . ESOPHAGOGASTRODUODENOSCOPY  2012   Dr. IfMinna Merritts. OSTOMY    . OVARY SURGERY     removal  . PERIPHERAL VASCULAR CATHETERIZATION N/A 03/02/2015   Procedure: PoGlori Luisath Insertion;  Surgeon: JaAlgernon HuxleyMD;  [facility];  Service: Cardiovascular;  Laterality: N/A;  . TUBAL LIGATION  1970    Family History  Problem Relation Age of Onset  . Diabetes Mother   . Arthritis Mother   . Diabetes Father   . Arthritis Father   . Bone cancer Sister     Social History:  reports that she quit smoking about 22 years ago. Her smoking use included Cigarettes. She has a 30.00 pack-year smoking history. She has never used smokeless tobacco. She reports that she does not drink alcohol or use drugs.  She is accompanied by her husband today.  Allergies:  Allergies  Allergen Reactions  . Codeine Nausea And Vomiting  . Latex Rash    Current Medications: Current Outpatient Prescriptions  Medication Sig Dispense Refill  . Calcium-Vitamin D 600-200 MG-UNIT tablet Take 2 tablets by mouth daily.     Marland Kitchen enoxaparin (LOVENOX) 60 MG/0.6ML injection Inject 0.6 mLs (60 mg total) into  the skin every 12 (twelve) hours. 60 Syringe 1  . folic acid (FOLVITE) 1 MG tablet TAKE 1 TABLET BY MOUTH ONCE A DAY 90 tablet 3  . insulin lispro (HUMALOG) 100 UNIT/ML injection Inject 0.06 mLs (6 Units total) into the skin daily. (Patient taking differently: Inject 0-85 Units into the skin daily. Via insulin pump) 30 mL 3  . lidocaine-prilocaine (EMLA) cream Apply 1 application topically as needed (prior to accessing port).    . loratadine (CLARITIN) 10 MG tablet Take 1 tablet by mouth daily as needed.    Marland Kitchen losartan (COZAAR) 100 MG tablet Take 1 tablet (100 mg total) by mouth daily. 90 tablet 1  . magnesium oxide (MAG-OX) 400 (241.3 Mg) MG tablet TAKE 1 TABLET BY MOUTH ONCE A DAY 30 tablet 0  . metoprolol tartrate (LOPRESSOR) 25 MG tablet Take 1 tablet (25 mg total) by mouth 2 (two) times daily. 180 tablet 3  . ondansetron (ZOFRAN) 8 MG tablet TAKE 1 TABLET BY MOUTH EVERY 8 HOURS AS NEEDED FOR NAUSEA OR VOMITING 30 tablet 1  . ONE TOUCH ULTRA TEST test strip     . pantoprazole (PROTONIX) 40 MG tablet TAKE 1 TABLET BY MOUTH TWICE A DAY 180 tablet 1  . potassium chloride SA (K-DUR,KLOR-CON) 20 MEQ tablet Take 1 tablet (20 mEq total) by mouth daily. (Patient taking differently: Take 40 mEq by mouth daily. ) 20 tablet 0  . predniSONE (DELTASONE) 5 MG tablet Take 5 mg by mouth daily with breakfast.    . ranitidine (ZANTAC) 150 MG tablet Take 1 tablet (150 mg total) by mouth 2 (two) times daily. 180 tablet 1  . vitamin C (ASCORBIC ACID) 500 MG tablet Take 500 mg by mouth daily.    Marland Kitchen zinc gluconate 50 MG tablet Take 50 mg by mouth daily.    Marland Kitchen atorvastatin (LIPITOR) 40 MG tablet Take 1 tablet (40 mg total) by mouth daily at 6 PM. (Patient not taking: Reported on 06/14/2016) 30 tablet 2  . ELIQUIS 5 MG TABS tablet Take 5 mg by mouth daily.     Current Facility-Administered Medications  Medication Dose Route Frequency Provider Last Rate Last Dose  . Tbo-Filgrastim (GRANIX) injection 300 mcg  300 mcg  Subcutaneous Once Lequita Asal, MD       Facility-Administered Medications Ordered in Other Visits  Medication Dose Route Frequency Provider Last Rate Last Dose  . magnesium sulfate IVPB 4 g 100 mL  4 g Intravenous Once Lequita Asal, MD      . sodium chloride 0.9 % injection 10 mL  10 mL Intracatheter PRN Lequita Asal, MD      . sodium chloride 0.9 % injection 10 mL  10 mL Intravenous PRN Lequita Asal, MD   10 mL at 04/13/15 2947    Review of Systems:  GENERAL:  Feels good.  No  fever, chills or sweats.  Weight up 1 pound. PERFORMANCE STATUS (ECOG):  1 HEENT:  Eye sight improving.  No sore throat, mouth sores or tenderness. Lungs:  No shortness of breath or cough.  No hemoptysis. Cardiac:  No chest pain, palpitations, orthopnea, or PND. GI:  No abdominal pain or distention.  No nausea, vomiting, diarrhea, melena or hematochezia. GU:  No urgency, frequency, dysuria, or hematuria. Musculoskeletal:  Severe rheumatoid arthritis (off MTX and on prednisone).  Osteoporosis.  No back pain. No muscle tenderness. Extremities:  No pain or swelling. Skin:  No increased bruising or bleeding.  No rashes or skin changes. Neuro:  Neuropathy (stable).  No headache, numbness or weakness, balance or coordination issues. Endocrine:  Diabetes on an insulin pump.  No thyroid issues, hot flashes or night sweats. Psych:  No mood changes, depression or anxiety. Pain: No pain. Review of systems:  All other systems reviewed and found to be negative.  Physical Exam: Blood pressure 116/63, pulse 86, temperature (!) 95.8 F (35.4 C), temperature source Tympanic, resp. rate 18, weight 138 lb 14.2 oz (63 kg). GENERAL:  Well developed, well nourished woman sitting comfortably in the exam room in no acute distress.  She has a cane at her side. MENTAL STATUS:  Alert and oriented to person, place and time. HEAD:  Curly gray hair.  Normocephalic, atraumatic, face symmetric, no Cushingoid  features. EYES:  Gold rimmed glasses.  Blue eyes.  No conjunctivitis or scleral icterus.  ENT: Oropharynx clear without lesion. Tongue normal. Mucous membranes moist.  RESPIRATORY: Clear to auscultation without rales, wheezes or rhonchi. CARDIOVASCULAR: Regular rate and rhythm without murmur, rub or gallop. ABDOMEN: Soft, non-tender with active bowel sounds and no hepatosplenomegaly. No palpable nodularity or masses.  Insulin pump. SKIN: No rashes, ulcers, or bruises. EXTREMITIES:  No edema, skin discoloration or tenderness. No palpable cords. LYMPH NODES: No palpable cervical, supraclavicular, axillary or inguinal adenopathy  NEUROLOGICAL: Appropriate. PSYCH: Appropriate.   Appointment on 06/14/2016  Component Date Value Ref Range Status  . WBC 06/14/2016 2.3* 3.6 - 11.0 K/uL Final  . RBC 06/14/2016 2.77* 3.80 - 5.20 MIL/uL Final  . Hemoglobin 06/14/2016 9.3* 12.0 - 16.0 g/dL Final  . HCT 06/14/2016 26.4* 35.0 - 47.0 % Final  . MCV 06/14/2016 95.6  80.0 - 100.0 fL Final  . MCH 06/14/2016 33.5  26.0 - 34.0 pg Final  . MCHC 06/14/2016 35.0  32.0 - 36.0 g/dL Final  . RDW 06/14/2016 20.5* 11.5 - 14.5 % Final  . Platelets 06/14/2016 202  150 - 440 K/uL Final  . Neutrophils Relative % 06/14/2016 51  % Final  . Neutro Abs 06/14/2016 1.2* 1.4 - 6.5 K/uL Final  . Lymphocytes Relative 06/14/2016 26  % Final  . Lymphs Abs 06/14/2016 0.6* 1.0 - 3.6 K/uL Final  . Monocytes Relative 06/14/2016 20  % Final  . Monocytes Absolute 06/14/2016 0.5  0.2 - 0.9 K/uL Final  . Eosinophils Relative 06/14/2016 2  % Final  . Eosinophils Absolute 06/14/2016 0.0  0 - 0.7 K/uL Final  . Basophils Relative 06/14/2016 1  % Final  . Basophils Absolute 06/14/2016 0.0  0 - 0.1 K/uL Final  . Sodium 06/14/2016 134* 135 - 145 mmol/L Final  . Potassium 06/14/2016 4.2  3.5 - 5.1 mmol/L Final  . Chloride 06/14/2016 100* 101 - 111 mmol/L Final  . CO2 06/14/2016 27  22 - 32 mmol/L Final  . Glucose, Bld  06/14/2016 238* 65 - 99 mg/dL Final  .  BUN 06/14/2016 18  6 - 20 mg/dL Final  . Creatinine, Ser 06/14/2016 0.82  0.44 - 1.00 mg/dL Final  . Calcium 06/14/2016 8.8* 8.9 - 10.3 mg/dL Final  . Total Protein 06/14/2016 6.9  6.5 - 8.1 g/dL Final  . Albumin 06/14/2016 3.4* 3.5 - 5.0 g/dL Final  . AST 06/14/2016 22  15 - 41 U/L Final  . ALT 06/14/2016 17  14 - 54 U/L Final  . Alkaline Phosphatase 06/14/2016 63  38 - 126 U/L Final  . Total Bilirubin 06/14/2016 0.5  0.3 - 1.2 mg/dL Final  . GFR calc non Af Amer 06/14/2016 >60  >60 mL/min Final  . GFR calc Af Amer 06/14/2016 >60  >60 mL/min Final   Comment: (NOTE) The eGFR has been calculated using the CKD EPI equation. This calculation has not been validated in all clinical situations. eGFR's persistently <60 mL/min signify possible Chronic Kidney Disease.   . Anion gap 06/14/2016 7  5 - 15 Final  . Magnesium 06/14/2016 1.3* 1.7 - 2.4 mg/dL Final    Assessment:  CHALISE PE is a 71 y.o. female with stage IV ovarian cancer.  She presented with abdominal discomfort and bloating.  Omental biopsy on 02/23/2015 revealed metastatic high grade serous carcinoma, consistent with gynecologic origin.   She was initially diagnosed with clinical stage IIIC (T3cN1Mx).  CA125 was 707 on 02/17/2015.  Abdomen and pelvic CT scan on 02/13/2015 revealed bilateral mass-like adnexal regions (right adnexal mass 5.6 x 5.0 cm and the left adnexa mass 10.3 x 6.0 x 9.9 cm).  There was a large amount of soft tissue throughout the peritoneal cavity involving the omentum and other peritoneal surfaces.  There was a small volume ascites. There was a peripheral 3.3 x 1.9 cm low-attenuation lesion overlying the right lobe of the liver, likely representing a serosal implant. There was 1.4 x 2.2 cm ill-defined peripheral lesion within the inferior aspect of segment 6 adjacent to the ampulla in the duodenum.   She received 4 cycles of neoadjuvant carboplatin and Taxol (03/05/2015 -  05/22/2015).  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  She had loose stools on oral magnesium.  She was initially on Neurontin then switched Lyrica with cycle #3.  Cycle #4 was notable for neutropenia (ANC 300) requiring GCSF x 3 days.    CA125 was 802.9 on 03/30/2015, 567.9 on 04/13/2015, 168.8 on 05/15/2015, 85.2 on 07/17/2015, 68.6 on 07/28/2015, 34.5 on 09/17/2015, 20.7 on 11/06/2015, 49 on 01/22/2016, 106.1 on 02/22/2016, 138.8 on 03/07/2016, 220.8 on 03/21/2016, 152.8 on 04/11/2016, 125.6 on 04/18/2016, 76.8 on 05/03/2016, and 60.2 on 05/24/2016.  Abdominal and pelvic CT scan on 04/28/2015 revealed decreasing bilateral ovarian masses.   The left adnexal mass measured 4.0 x 6.1 cm (previously 5.0 x 8.5 cm). The right ovary measured 3.8 x 4.9 cm (previously 4.7 x 5.6 cm).  There was improved peritoneal carcinomatosis.  There was a small amount of ascites.  The hepatic dome lesion was stable. The previously seen right hepatic lobe lesion was not well-visualized.  There was a small left pleural effusion and trace right pleural effusion.  The nodular lesion at the ampulla of Vater, extending into the duodenum was stable.  There was no biliary ductal dilatation.  She underwent exploratory laparotomy, lysis of adhesions, total abdominal hysterectomy with bilateral salpingo-oophorectomy, infracolic omentectomy, optimal tumor debulking(< 1 cm), recto-sigmoid resection with creation of end colostomy, cholecystectomy, mobilization of splenic flexure and liver with diaphragmatic stripping on 06/15/2015. The right diaphragm  was cleared of tumor. During dissection, the diaphragm was entered and closed with sutures.   She has had a recurrent right sided pleural effusion.  She underwent thoracentesis of 650 cc on post-operative day 3.  She was admitted to [facility] on 07/01/2015 and 07/07/2015 for recurrent shortness of breath.  She has undergone 2 additional thoracentesis (1.1 L on 07/02/2015 and 850 cc on  07/08/2015).  Cytology was negative x 2.  Bilateral lower extremity duplex on 07/03/2015 was negative.  Echo revealed an EF of 55-60% on 07/08/2015 and 60-65% on 05/27/2016.    She has severe rheumatoid arthritis.  Methotrexate and Enbrel were initially on hold.  She has a normocytic anemia.  Work-up on 02/17/2015 and 05/24/2016 revealed a normal ferritin, B12, folate, TSH.  She denies any melena or hematochezia.    She has anemia due to chronic disease. She received 1 unit PRBCs during her admission at [facility]. She denies any melena or hematochezia. She has diabetes and is on an insulin pump.  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 She was on Lovenox 60 mg twice a day.  She switched to Eliquis on 04/16/2016.  She was admitted to [facility] from 07/29/2015 - 08/10/2015 with a right-sided empyema and liver abscess. She underwent CT-guided placement of a liver abscess drain on 07/30/2015. Liver abscess culture grew out group B strep and Enterobacter which was sensitive to Zosyn. She was transitioned to ertapenem Colbert Ewing) prior to discharge.  She was readmitted to [facility] on 09/17/2015.  Chest, abdomen, and pelvic CT scan on 09/01/2015 revealed continued decrease in perihepatic fluid collection contiguous with the right pleural space with percutaneous drain. Drain was removed on 09/01/2015.  Chest, abdomen, and pelvic CT scan on 09/11/2015 revealed a residual versus recurrent fluid collection posterior to the right hepatic lobe (2.6 x 1.1 x 4.0 cm).  Chest, abdomen, and pelvic CT scan on 10/13/2015 revealed resolution of the empyema.    She has had persistent neutropenia felt secondary to her rheumatoid arthritis.  Folate and MMA were normal.  TSH was 6.13 (high) with a free T4 of 1.17 (0.61-1.12).  She began methotrexate (10 mg a week) and prednisone (5 mg a day)  for severe rheumatoid arthritis on 12/17/2015.  Bone marrow aspirate and biopsy on 06/09/2010 revealed a hypercellular marrow (70%) with no evidence of dysplasia or malignancy.  Flow cytometry was negative.  Cytogenetics were normal (46,XX).  FISH studies were negative for MDS.  Bone marrow aspirate and biopsy on 12/03/2015 revealed a normocellular to mildy hypercellular marrow for age (40%) with left shifted myelopoiesis, non specific dyserythropoiesis and mild megakaryocytic atypia with no increase in blasts.  There were multiple small nonspecific lymphoid aggregates (favor reactive). There was no increase in reticulin.  There was decreased myeloid cells (37%) with left shifted maturation and 1% atypical myelod blasts.  There was relatively increased monocytic cells (11%), relatively increased lymphoid cells (36%), and relatively increased eosinophils (6%).  Cytogenetics were normal (23, XX).  SNP microarray was normal.  Alkaline phosphatase was 348 (38-126) on 11/06/2015 and 431 on 11/17/2015.   Fractionated alkaline phosphatase on 11/09/2015 revealed 21% bone and 79% liver.  MRI of the abdomen on 12/14/2015 revealed mild extrahepatic biliary duct dilatation without obstructing lesion identified.  Chest, abdomen, and pelvic CT scan on 02/15/2016 revealed multiple soft tissue nodules throughout the pelvis, small bowel mesenteric and peritoneum  and, concerning for peritoneal metastatic disease.  There was soft tissue irregularity within the left upper quadrant.  There was mild right hydronephrosis (etiology unclear).  There was interval resolution of previously described right pleural based fluid and gas collection. There was a pleural based nodule within the right lower hemi-thorax concerning for pleural based metastasis.  There were small bilateral pleural effusions.  There was pulmonary nodularity, predominately within the left upper lobe (metastatic or infectious/inflammatory etiology).  There was  slightly increased mediastinal adenopathy (infectious/inflammatory or metastatic).  There was a low attenuation lesion within the left hepatic lobe (complicated fluid within the fissure or metastatic disease).  She was on tamoxifen from 02/25/2016 - 03/14/2016.  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  Abdomen and pelvic CT scan on 03/08/2016 revealed mild progression of peritoneal disease in the abdomen.  There was interval increase in loculated fluid around the lateral segment left liver, stomach, and spleen.  There was persistent soft tissue lesion at the level of the ampulla with mild intra and extrahepatic biliary duct dilatation.  There was persistent intrahepatic and capsular metastatic disease involving the liver.  She received 4 cycles of carboplatin and gemcitabine (03/21/2016 - 05/24/2016) with GCSF/Neulasta support.  Adomen and pelvic CT scan on 06/10/2016 revealed progression of peritoneal carcinomatosis predominantly in the perihepatic space, bilateral lower quadrants and pelvis.  There was worsening bilateral obstructive uropathy due to malignant involvement of the pelvic ureters.  There was stable small pleural/subpleural nodules at the right lung base.  There was stable small left pleural effusion.  There was decreased small volume perihepatic ascites.  She was admitted to [facility] from 05/26/2016 - 05/28/2016 with altered mental status and an acute embolic CVA.  Head MRI on 05/26/2016 revealed multiple small areas of acute infarct involving the occipital parietal lobe bilaterally and the left lateral cerebellum, consistent with posterior circulation emboli.  Work-up included a negative carotid ultrasound and echocardiogram.  Symptomatically, she feels good.  Counts are at patient's baseline (WBC 2300 with ANC 1200).  Platelet count is normal.  She has hypomagnesemia (1.3).  Plan: 1.  Labs today:  CBC with diff, CMP, Mg. 2.  Discuss results of imaging.  Discuss  progressive disease.  Discuss discontinuation of carboplatin and gemcitabine.  Discuss options of clinical trial or single agent Doxil.  Discuss testing for MSI/MMR. and BRCA mutation.  Would avoid Avastin given patient's recent CVA/thrombosis on Eliquis. 3.  Discuss obstructive uropathy.  No current symptoms.  Creatine stable.  Discuss evaluation by urology. 4.  Discontinue carboplatin and gemcitabine. 5.  Magnesium 4 gm IV today. 6.  Consult Urology re: hydronephrosis. 7.  Preauth Doxil. 8.  RTC in 1 week for MD assessment, labs (CBC with diff, CMP, Mg), Doxil +/- magnesium.   Lequita Asal, MD  06/14/2016, 9:34 AM

## 2016-06-14 NOTE — Progress Notes (Signed)
Patient offers no complaints today. 

## 2016-06-15 ENCOUNTER — Other Ambulatory Visit: Payer: Self-pay | Admitting: Hematology and Oncology

## 2016-06-16 ENCOUNTER — Encounter: Payer: Medicare Other | Admitting: Surgery

## 2016-06-16 DIAGNOSIS — E10621 Type 1 diabetes mellitus with foot ulcer: Secondary | ICD-10-CM | POA: Diagnosis not present

## 2016-06-17 NOTE — Progress Notes (Signed)
START ON PATHWAY REGIMEN - Ovarian  [id number]: Liposomal Doxorubicin (Doxil) 40 mg/m2 D1 + Bevacizumab 10 mg/kg D1, 15 q28 Days; Re-evaluate Every 3 Cycles, Treat until Complete Response, Unacceptable Toxicity, or Disease Progression    A cycle is every 28 days:     Liposomal doxorubicin (Doxil(R)) 40 mg/m2 in 250 mL D5W IV over 60 minutes day 1, initial infusion rate should not exceed 1 mg/min       Dose Mod: None     Bevacizumab (Avastin(R)) 10 mg/kg in 100 mL NS IV days 1 and 15, over 90 minutes first infusion, 60 minutes second infusion, and 30 minutes all subsequent infusions if tolerated       Dose Mod: None  **Always confirm dose/schedule in your pharmacy ordering system**    Clinician Notes: not a candidate for Avastin secondary to CVA  Patient Characteristics: Third Line Therapy, Platinum Resistant or < 6 Months Since Last Platinum Therapy AJCC T Category: Staged < 8th Ed. AJCC N Category: Staged < 8th Ed. AJCC M Category: Staged < 8th Ed. AJCC 8 Stage Grouping: Staged < 8th Ed. Line of Therapy: Third Line Therapy BRCA Mutation Status: Absent Would you be surprised if this patient died  in the next year? I would be surprised if this patient died in the next year  Intent of Therapy: Non-Curative / Palliative Intent, Discussed with Patient

## 2016-06-18 NOTE — Progress Notes (Signed)
BRITTYN, KLECHA ([id number]) Visit Report for 06/16/2016 Arrival Information Details Patient Name: [name], [name] Date of Service: 06/16/2016 3:30 PM Medical Record Number: [id number] Patient Account Number: [id number] Date of Birth/Sex: 12/05/1945 (70 y.o. Female) Treating RN: Cornell Barman Primary Care Lakisha Peyser: Caryl Bis, ERIC Other Clinician: Referring Acy Orsak: Caryl Bis, ERIC Treating Cree Kunert/Extender: Frann Rider in Treatment: 24 Visit Information History Since Last Visit Added or deleted any medications: No Patient Arrived: Cane Any new allergies or adverse reactions: No Arrival Time: 15:44 Had a fall or experienced change in No Accompanied By: husband activities of daily living that may affect Transfer Assistance: None risk of falls: Patient Identification Verified: Yes Signs or symptoms of abuse/neglect since last No Secondary Verification Process Yes visito Completed: Hospitalized since last visit: No Patient Requires Transmission- No Has Dressing in Place as Prescribed: Yes Based Precautions: Pain Present Now: No Patient Has Alerts: Yes Patient Alerts: Patient on Blood Thinner eliquis BID DMII Electronic Signature(s) Signed: 06/17/2016 10:12:04 AM By: Gretta Cool, RN, BSN, Kim RN, BSN Entered By: Gretta Cool, RN, BSN, Kim on 06/16/2016 15:44:30 [name] ([id number]) -------------------------------------------------------------------------------- Encounter Discharge Information Details Patient Name: [name] Date of Service: 06/16/2016 3:30 PM Medical Record Number: [id number] Patient Account Number: [id number] Date of Birth/Sex: [birth year] (70 y.o. Female) Treating RN: Baruch Gouty, RN, BSN, Velva Harman Primary Care Navraj Dreibelbis: Caryl Bis, ERIC Other Clinician: Referring Herndon Grill: Caryl Bis, ERIC Treating Raydell Maners/Extender: Frann Rider in Treatment: 24 Encounter Discharge Information Items Schedule Follow-up Appointment: No Medication  Reconciliation completed No and provided to Patient/Care Jaylynne Birkhead: Provided on Clinical Summary of Care: 06/16/2016 Form Type Recipient Paper Patient SR Electronic Signature(s) Signed: 06/16/2016 4:05:33 PM By: Ruthine Dose Entered By: Ruthine Dose on 06/16/2016 16:05:32 [name] ([id number]) -------------------------------------------------------------------------------- Lower Extremity Assessment Details Patient Name: [name] Date of Service: 06/16/2016 3:30 PM Medical Record Number: [id number] Patient Account Number: [id number] Date of Birth/Sex: 11-03-1945 (70 y.o. Female) Treating RN: Cornell Barman Primary Care Mikhael Hendriks: Caryl Bis, ERIC Other Clinician: Referring Havilah Topor: Caryl Bis, ERIC Treating Zeola Brys/Extender: Frann Rider in Treatment: 24 Vascular Assessment Claudication: Claudication Assessment [Left:None] Pulses: Dorsalis Pedis Palpable: [Left:Yes] Posterior Tibial Extremity colors, hair growth, and conditions: Extremity Color: [Left:Pale] Hair Growth on Extremity: [Left:No] Temperature of Extremity: [Left:Cool] Capillary Refill: [Left:< 3 seconds] Dependent Rubor: [Left:No] Blanched when Elevated: [Left:No] Lipodermatosclerosis: [Left:No] Toe Nail Assessment Left: Right: Thick: No Discolored: No Deformed: No Improper Length and Hygiene: No Electronic Signature(s) Signed: 06/17/2016 10:12:04 AM By: Gretta Cool, RN, BSN, Kim RN, BSN Entered By: Gretta Cool, RN, BSN, Kim on 06/16/2016 15:50:28 [name] ([id number]) -------------------------------------------------------------------------------- Multi Wound Chart Details Patient Name: [name] Date of Service: 06/16/2016 3:30 PM Medical Record Number: [id number] Patient Account Number: [id number] Date of Birth/Sex: Aug 07, 1945 (70 y.o. Female) Treating RN: Cornell Barman Primary Care Murdock Jellison: Caryl Bis, ERIC Other Clinician: Referring Amarii Bordas: Caryl Bis, ERIC Treating  Welton Bord/Extender: Frann Rider in Treatment: 24 Vital Signs Height(in): 63 Pulse(bpm): 74 Weight(lbs): 130 Blood Pressure 140/55 (mmHg): Body Mass Index(BMI): 23 Temperature(F): 98.1 Respiratory Rate 16 (breaths/min): Photos: [N/A:N/A] Wound Location: Left Calcaneus N/A N/A Wounding Event: Pressure Injury N/A N/A Primary Etiology: Pressure Ulcer N/A N/A Secondary Etiology: Diabetic Wound/Ulcer of N/A N/A the Lower Extremity Comorbid History: Cataracts, Type I N/A N/A Diabetes, Rheumatoid Arthritis, Osteoarthritis, Neuropathy, Received Chemotherapy Date Acquired: 07/15/2015 N/A N/A Weeks of Treatment: 24 N/A N/A Wound Status: Open N/A N/A Measurements L x W x D 0.3x0.3x0.9 N/A N/A (cm) Area (cm) : 0.071 N/A N/A Volume (cm) : 0.064 N/A N/A % Reduction in Area: 69.90%  N/A N/A % Reduction in Volume: 31.90% N/A N/A Classification: Category/Stage III N/A N/A HBO Classification: Grade 1 N/A N/A Exudate Amount: Small N/A N/A Exudate Type: Serous N/A N/A Exudate Color: amber N/A N/A EASTYN, BOOMSMA ([id number]) Wound Margin: Flat and Intact N/A N/A Granulation Amount: Medium (34-66%) N/A N/A Granulation Quality: Pale N/A N/A Necrotic Amount: None Present (0%) N/A N/A Exposed Structures: Fascia: No N/A N/A Fat Layer (Subcutaneous Tissue) Exposed: No Tendon: No Muscle: No Joint: No Bone: No Epithelialization: Medium (34-66%) N/A N/A Debridement: Open Wound/Selective N/A N/A [id number]) - Selective Pre-procedure 15:55 N/A N/A Verification/Time Out Taken: Tissue Debrided: Skin N/A N/A Level: Non-Viable Tissue N/A N/A Debridement Area (sq 0.2 N/A N/A cm): Instrument: Forceps, Scissors N/A N/A Bleeding: Minimum N/A N/A Hemostasis Achieved: Pressure N/A N/A Procedural Pain: 1 N/A N/A Post Procedural Pain: 0 N/A N/A Debridement Treatment Procedure was tolerated N/A N/A Response: well Post Debridement 0.4x0.5x0.9 N/A N/A Measurements L x W x  D (cm) Post Debridement 0.141 N/A N/A Volume: (cm) Post Debridement Category/Stage III N/A N/A Stage: Periwound Skin Texture: Excoriation: No N/A N/A Induration: No Callus: No Crepitus: No Rash: No Scarring: No Periwound Skin Maceration: No N/A N/A Moisture: Dry/Scaly: No Periwound Skin Color: Atrophie Blanche: No N/A N/A Cyanosis: No Ecchymosis: No Erythema: No Hemosiderin Staining: No Mottled: No SHERWANDA, VANLIEW ([id number]) Pallor: No Rubor: No Temperature: No Abnormality N/A N/A Tenderness on Yes N/A N/A Palpation: Wound Preparation: Ulcer Cleansing: N/A N/A Rinsed/Irrigated with Saline Topical Anesthetic Applied: None Procedures Performed: Debridement N/A N/A Treatment Notes Electronic Signature(s) Signed: 06/16/2016 4:04:13 PM By: Christin Fudge MD, FACS Entered By: Christin Fudge on 06/16/2016 16:04:13 [name] ([id number]) -------------------------------------------------------------------------------- [place] Details Patient Name: [name] Date of Service: 06/16/2016 3:30 PM Medical Record Number: [id number] Patient Account Number: [id number] Date of Birth/Sex: [birth year] (70 y.o. Female) Treating RN: Cornell Barman Primary Care Heith Haigler: Caryl Bis, ERIC Other Clinician: Referring Chanese Hartsough: Caryl Bis, ERIC Treating Cionna Collantes/Extender: Frann Rider in Treatment: 24 Active Inactive ` Orientation to the Wound Care Program Nursing Diagnoses: Knowledge deficit related to the wound healing center program Goals: Patient/caregiver will verbalize understanding of the [facility] Program Date Initiated: 12/31/2015 Target Resolution Date: 08/12/2016 Goal Status: Active Interventions: Provide education on orientation to the wound center Notes: ` Pressure Nursing Diagnoses: Knowledge deficit related to causes and risk factors for pressure ulcer development Knowledge deficit related to management of pressures  ulcers Potential for impaired tissue integrity related to pressure, friction, moisture, and shear Goals: Patient will remain free from development of additional pressure ulcers Date Initiated: 12/31/2015 Target Resolution Date: 08/12/2016 Goal Status: Active Patient will remain free of pressure ulcers Date Initiated: 12/31/2015 Target Resolution Date: 08/12/2016 Goal Status: Active Patient/caregiver will verbalize risk factors for pressure ulcer development Date Initiated: 12/31/2015 Target Resolution Date: 08/12/2016 Goal Status: Active Patient/caregiver will verbalize understanding of pressure ulcer management Date Initiated: 12/31/2015 Target Resolution Date: 08/12/2016 Goal Status: Active JERISHA, RISPER ([id number]) Interventions: Assess: immobility, friction, shearing, incontinence upon admission and as needed Assess offloading mechanisms upon admission and as needed Assess potential for pressure ulcer upon admission and as needed Provide education on pressure ulcers Treatment Activities: Patient referred for pressure reduction/relief devices : 12/31/2015 Notes: ` Wound/Skin Impairment Nursing Diagnoses: Impaired tissue integrity Knowledge deficit related to smoking impact on wound healing Knowledge deficit related to ulceration/compromised skin integrity Goals: Patient/caregiver will verbalize understanding of skin care regimen Date Initiated: 12/31/2015 Target Resolution Date: 08/12/2016 Goal Status: Active Ulcer/skin breakdown will  have a volume reduction of 30% by week 4 Date Initiated: 12/31/2015 Target Resolution Date: 08/12/2016 Goal Status: Active Ulcer/skin breakdown will have a volume reduction of 50% by week 8 Date Initiated: 12/31/2015 Target Resolution Date: 08/12/2016 Goal Status: Active Ulcer/skin breakdown will have a volume reduction of 80% by week 12 Date Initiated: 12/31/2015 Target Resolution Date: 08/12/2016 Goal Status: Active Ulcer/skin breakdown will  heal within 14 weeks Date Initiated: 12/31/2015 Target Resolution Date: 08/12/2016 Goal Status: Active Interventions: Assess patient/caregiver ability to obtain necessary supplies Assess patient/caregiver ability to perform ulcer/skin care regimen upon admission and as needed Assess ulceration(s) every visit Provide education on ulcer and skin care Treatment Activities: Skin care regimen initiated : 12/31/2015 Topical wound management initiated : 12/31/2015 RA, CLENDANIEL ([id number]) Notes: Electronic Signature(s) Signed: 06/17/2016 10:12:04 AM By: Gretta Cool, RN, BSN, Kim RN, BSN Entered By: Gretta Cool, RN, BSN, Kim on 06/16/2016 15:54:13 [name] ([id number]) -------------------------------------------------------------------------------- Pain Assessment Details Patient Name: [name] Date of Service: 06/16/2016 3:30 PM Medical Record Number: [id number] Patient Account Number: [id number] Date of Birth/Sex: [birth year] (70 y.o. Female) Treating RN: Cornell Barman Primary Care Kristianna Saperstein: Caryl Bis, ERIC Other Clinician: Referring Shalia Bartko: Caryl Bis, ERIC Treating Rajni Holsworth/Extender: Frann Rider in Treatment: 24 Active Problems Location of Pain Severity and Description of Pain Patient Has Paino No Site Locations With Dressing Change: No Pain Management and Medication Current Pain Management: Electronic Signature(s) Signed: 06/17/2016 10:12:04 AM By: Gretta Cool, RN, BSN, Kim RN, BSN Entered By: Gretta Cool, RN, BSN, Kim on 06/16/2016 15:44:37 Casselman, Sofie Hartigan ([id number]) -------------------------------------------------------------------------------- Wound Assessment Details Patient Name: [name] Date of Service: 06/16/2016 3:30 PM Medical Record Number: [id number] Patient Account Number: [id number] Date of Birth/Sex: Jul 28, 1945 (70 y.o. Female) Treating RN: Cornell Barman Primary Care Sola Margolis: Caryl Bis, ERIC Other Clinician: Referring Misty Foutz: Caryl Bis,  ERIC Treating Jeidy Hoerner/Extender: Frann Rider in Treatment: 24 Wound Status Wound Number: 2 Primary Pressure Ulcer Etiology: Wound Location: Left Calcaneus Secondary Diabetic Wound/Ulcer of the Lower Wounding Event: Pressure Injury Etiology: Extremity Date Acquired: 07/15/2015 Wound Open Weeks Of Treatment: 24 Status: Clustered Wound: No Comorbid Cataracts, Type I Diabetes, History: Rheumatoid Arthritis, Osteoarthritis, Neuropathy, Received Chemotherapy Photos Wound Measurements Length: (cm) 0.3 Width: (cm) 0.3 Depth: (cm) 0.9 Area: (cm) 0.071 Volume: (cm) 0.064 % Reduction in Area: 69.9% % Reduction in Volume: 31.9% Epithelialization: Medium (34-66%) Tunneling: No Undermining: No Wound Description Classification: Category/Stage III Foul Odor Diabetic Severity (Wagner): Grade 1 Wound Margin: Flat and Intact Exudate Amount: Small Exudate Type: Serous Exudate Color: amber After Cleansing: No Wound Bed Granulation Amount: Medium (34-66%) Exposed Structure Granulation Quality: Pale Fascia Exposed: No Necrotic Amount: None Present (0%) Fat Layer (Subcutaneous Tissue) Exposed: No Tendon Exposed: No NAVI, POLIS ([id number]) Muscle Exposed: No Joint Exposed: No Bone Exposed: No Periwound Skin Texture Texture Color No Abnormalities Noted: No No Abnormalities Noted: No Callus: No Atrophie Blanche: No Crepitus: No Cyanosis: No Excoriation: No Ecchymosis: No Induration: No Erythema: No Rash: No Hemosiderin Staining: No Scarring: No Mottled: No Pallor: No Moisture Rubor: No No Abnormalities Noted: No Dry / Scaly: No Temperature / Pain Maceration: No Temperature: No Abnormality Tenderness on Palpation: Yes Wound Preparation Ulcer Cleansing: Rinsed/Irrigated with Saline Topical Anesthetic Applied: None Electronic Signature(s) Signed: 06/17/2016 10:12:04 AM By: Gretta Cool, RN, BSN, Kim RN, BSN Entered By: Gretta Cool, RN, BSN, Kim on 06/16/2016  15:49:50 [name] ([id number]) -------------------------------------------------------------------------------- [place] Details Patient Name: [name] Date of Service: 06/16/2016 3:30 PM Medical Record Number: [id number] Patient Account Number: [id number] Date of Birth/Sex:  [birth year] (71 y.o. Female) Treating RN: Cornell Barman Primary Care Brysen Shankman: Caryl Bis, ERIC Other Clinician: Referring Alesi Zachery: Caryl Bis, ERIC Treating Flossie Wexler/Extender: Frann Rider in Treatment: 24 Vital Signs Time Taken: 15:44 Temperature (F): 98.1 Height (in): 63 Pulse (bpm): 74 Weight (lbs): 130 Respiratory Rate (breaths/min): 16 Body Mass Index (BMI): 23 Blood Pressure (mmHg): 140/55 Reference Range: 80 - 120 mg / dl Electronic Signature(s) Signed: 06/17/2016 10:12:04 AM By: Gretta Cool, RN, BSN, Kim RN, BSN Entered By: Gretta Cool, RN, BSN, Kim on 06/16/2016 15:45:01

## 2016-06-21 ENCOUNTER — Encounter: Payer: Self-pay | Admitting: Hematology and Oncology

## 2016-06-21 ENCOUNTER — Ambulatory Visit: Payer: Medicare Other | Admitting: Urology

## 2016-06-21 ENCOUNTER — Inpatient Hospital Stay: Payer: Medicare Other

## 2016-06-21 ENCOUNTER — Inpatient Hospital Stay (HOSPITAL_BASED_OUTPATIENT_CLINIC_OR_DEPARTMENT_OTHER): Payer: Medicare Other | Admitting: Hematology and Oncology

## 2016-06-21 VITALS — BP 127/69 | HR 69 | Temp 98.7°F | Resp 18 | Wt 140.4 lb

## 2016-06-21 DIAGNOSIS — C786 Secondary malignant neoplasm of retroperitoneum and peritoneum: Secondary | ICD-10-CM | POA: Diagnosis not present

## 2016-06-21 DIAGNOSIS — Z8701 Personal history of pneumonia (recurrent): Secondary | ICD-10-CM

## 2016-06-21 DIAGNOSIS — Z7901 Long term (current) use of anticoagulants: Secondary | ICD-10-CM

## 2016-06-21 DIAGNOSIS — I252 Old myocardial infarction: Secondary | ICD-10-CM

## 2016-06-21 DIAGNOSIS — Z90722 Acquired absence of ovaries, bilateral: Secondary | ICD-10-CM

## 2016-06-21 DIAGNOSIS — K219 Gastro-esophageal reflux disease without esophagitis: Secondary | ICD-10-CM

## 2016-06-21 DIAGNOSIS — Z8673 Personal history of transient ischemic attack (TIA), and cerebral infarction without residual deficits: Secondary | ICD-10-CM

## 2016-06-21 DIAGNOSIS — N1330 Unspecified hydronephrosis: Secondary | ICD-10-CM

## 2016-06-21 DIAGNOSIS — Z5111 Encounter for antineoplastic chemotherapy: Secondary | ICD-10-CM

## 2016-06-21 DIAGNOSIS — J90 Pleural effusion, not elsewhere classified: Secondary | ICD-10-CM

## 2016-06-21 DIAGNOSIS — I998 Other disorder of circulatory system: Secondary | ICD-10-CM

## 2016-06-21 DIAGNOSIS — Z90710 Acquired absence of both cervix and uterus: Secondary | ICD-10-CM

## 2016-06-21 DIAGNOSIS — I10 Essential (primary) hypertension: Secondary | ICD-10-CM

## 2016-06-21 DIAGNOSIS — E876 Hypokalemia: Secondary | ICD-10-CM

## 2016-06-21 DIAGNOSIS — Z7689 Persons encountering health services in other specified circumstances: Secondary | ICD-10-CM | POA: Diagnosis not present

## 2016-06-21 DIAGNOSIS — Z794 Long term (current) use of insulin: Secondary | ICD-10-CM

## 2016-06-21 DIAGNOSIS — K222 Esophageal obstruction: Secondary | ICD-10-CM

## 2016-06-21 DIAGNOSIS — M069 Rheumatoid arthritis, unspecified: Secondary | ICD-10-CM

## 2016-06-21 DIAGNOSIS — I5032 Chronic diastolic (congestive) heart failure: Secondary | ICD-10-CM

## 2016-06-21 DIAGNOSIS — C569 Malignant neoplasm of unspecified ovary: Secondary | ICD-10-CM

## 2016-06-21 DIAGNOSIS — E785 Hyperlipidemia, unspecified: Secondary | ICD-10-CM

## 2016-06-21 DIAGNOSIS — Z86718 Personal history of other venous thrombosis and embolism: Secondary | ICD-10-CM

## 2016-06-21 DIAGNOSIS — E108 Type 1 diabetes mellitus with unspecified complications: Secondary | ICD-10-CM

## 2016-06-21 DIAGNOSIS — Z9641 Presence of insulin pump (external) (internal): Secondary | ICD-10-CM

## 2016-06-21 DIAGNOSIS — Z9049 Acquired absence of other specified parts of digestive tract: Secondary | ICD-10-CM

## 2016-06-21 DIAGNOSIS — R918 Other nonspecific abnormal finding of lung field: Secondary | ICD-10-CM

## 2016-06-21 DIAGNOSIS — Z808 Family history of malignant neoplasm of other organs or systems: Secondary | ICD-10-CM

## 2016-06-21 DIAGNOSIS — Z7952 Long term (current) use of systemic steroids: Secondary | ICD-10-CM

## 2016-06-21 DIAGNOSIS — K449 Diaphragmatic hernia without obstruction or gangrene: Secondary | ICD-10-CM

## 2016-06-21 DIAGNOSIS — C801 Malignant (primary) neoplasm, unspecified: Secondary | ICD-10-CM

## 2016-06-21 DIAGNOSIS — Z87891 Personal history of nicotine dependence: Secondary | ICD-10-CM

## 2016-06-21 DIAGNOSIS — D72819 Decreased white blood cell count, unspecified: Secondary | ICD-10-CM

## 2016-06-21 DIAGNOSIS — R188 Other ascites: Secondary | ICD-10-CM

## 2016-06-21 DIAGNOSIS — D638 Anemia in other chronic diseases classified elsewhere: Secondary | ICD-10-CM

## 2016-06-21 DIAGNOSIS — I2510 Atherosclerotic heart disease of native coronary artery without angina pectoris: Secondary | ICD-10-CM

## 2016-06-21 DIAGNOSIS — Z79899 Other long term (current) drug therapy: Secondary | ICD-10-CM

## 2016-06-21 LAB — COMPREHENSIVE METABOLIC PANEL
ALT: 15 U/L (ref 14–54)
AST: 18 U/L (ref 15–41)
Albumin: 3.4 g/dL — ABNORMAL LOW (ref 3.5–5.0)
Alkaline Phosphatase: 57 U/L (ref 38–126)
Anion gap: 6 (ref 5–15)
BUN: 20 mg/dL (ref 6–20)
CO2: 28 mmol/L (ref 22–32)
Calcium: 8.9 mg/dL (ref 8.9–10.3)
Chloride: 100 mmol/L — ABNORMAL LOW (ref 101–111)
Creatinine, Ser: 0.88 mg/dL (ref 0.44–1.00)
GFR calc Af Amer: >60 mL/min (ref >60)
GFR calc non Af Amer: >60 mL/min (ref >60)
Glucose, Bld: 232 mg/dL — ABNORMAL HIGH (ref 65–99)
Potassium: 4.4 mmol/L (ref 3.5–5.1)
Sodium: 134 mmol/L — ABNORMAL LOW (ref 135–145)
Total Bilirubin: 0.4 mg/dL (ref 0.3–1.2)
Total Protein: 6.9 g/dL (ref 6.5–8.1)

## 2016-06-21 LAB — CBC WITH DIFFERENTIAL/PLATELET
Basophils Absolute: 0 K/uL (ref 0–0.1)
Basophils Relative: 1 %
Eosinophils Absolute: 0 K/uL (ref 0–0.7)
Eosinophils Relative: 2 %
HCT: 27 % — ABNORMAL LOW (ref 35.0–47.0)
Hemoglobin: 9.4 g/dL — ABNORMAL LOW (ref 12.0–16.0)
Lymphocytes Relative: 17 %
Lymphs Abs: 0.4 K/uL — ABNORMAL LOW (ref 1.0–3.6)
MCH: 33.2 pg (ref 26.0–34.0)
MCHC: 34.9 g/dL (ref 32.0–36.0)
MCV: 95 fL (ref 80.0–100.0)
Monocytes Absolute: 0.4 K/uL (ref 0.2–0.9)
Monocytes Relative: 16 %
Neutro Abs: 1.5 K/uL (ref 1.4–6.5)
Neutrophils Relative %: 64 %
Platelets: 205 K/uL (ref 150–440)
RBC: 2.85 MIL/uL — ABNORMAL LOW (ref 3.80–5.20)
RDW: 18.9 % — ABNORMAL HIGH (ref 11.5–14.5)
WBC: 2.4 K/uL — ABNORMAL LOW (ref 3.6–11.0)

## 2016-06-21 LAB — MAGNESIUM: Magnesium: 1.4 mg/dL — ABNORMAL LOW (ref 1.7–2.4)

## 2016-06-21 LAB — SURGICAL PATHOLOGY

## 2016-06-21 MED ORDER — DOXORUBICIN HCL LIPOSOMAL CHEMO INJECTION 2 MG/ML
60.0000 mg | Freq: Once | INTRAVENOUS | Status: AC
  Administered 2016-06-21: 60 mg via INTRAVENOUS
  Filled 2016-06-21: qty 30

## 2016-06-21 MED ORDER — DEXTROSE 5 % IV SOLN
Freq: Once | INTRAVENOUS | Status: AC
  Administered 2016-06-21: 14:00:00 via INTRAVENOUS
  Filled 2016-06-21: qty 1000

## 2016-06-21 MED ORDER — HEPARIN SOD (PORK) LOCK FLUSH 100 UNIT/ML IV SOLN
500.0000 [IU] | Freq: Once | INTRAVENOUS | Status: AC | PRN
Start: 2016-06-21 — End: 2016-06-21
  Administered 2016-06-21: 500 [IU]

## 2016-06-21 MED ORDER — MAGNESIUM SULFATE 4 GM/100ML IV SOLN
4.0000 g | Freq: Once | INTRAVENOUS | Status: AC
  Administered 2016-06-21: 4 g via INTRAVENOUS
  Filled 2016-06-21: qty 100

## 2016-06-21 MED ORDER — SODIUM CHLORIDE 0.9% FLUSH
10.0000 mL | Freq: Once | INTRAVENOUS | Status: DC | PRN
  Filled 2016-06-21: qty 10

## 2016-06-21 MED ORDER — SODIUM CHLORIDE 0.9 % IV SOLN
Freq: Once | INTRAVENOUS | Status: AC
  Administered 2016-06-21: 12:00:00 via INTRAVENOUS
  Filled 2016-06-21: qty 1000

## 2016-06-21 MED ORDER — DEXAMETHASONE SODIUM PHOSPHATE 10 MG/ML IJ SOLN
10.0000 mg | Freq: Once | INTRAMUSCULAR | Status: AC
  Administered 2016-06-21: 10 mg via INTRAVENOUS
  Filled 2016-06-21: qty 1

## 2016-06-21 MED ORDER — POTASSIUM CHLORIDE 20 MEQ/100ML IV SOLN: 20.0000 meq | Freq: Once | INTRAVENOUS | Status: DC

## 2016-06-21 MED ORDER — HEPARIN SOD (PORK) LOCK FLUSH 100 UNIT/ML IV SOLN
500.0000 [IU] | Freq: Once | INTRAVENOUS | Status: DC | PRN
  Filled 2016-06-21: qty 5

## 2016-06-21 MED ORDER — SODIUM CHLORIDE 0.9 % IV SOLN: 10.0000 mg | Freq: Once | INTRAVENOUS | Status: DC

## 2016-06-21 NOTE — Progress Notes (Deleted)
06/21/2016 12:16 PM   [name] Sep 09, 1945 [id number]  Referring provider: Leone Haven, MD [address] [place], [place] [address]  No chief complaint on file.   HPI:  71 year old female with a history of stage IV ovarian cancer.  She was initially diagnosed on 01/2015 at which time CT abdomen pelvis revealed bilateral masslike adnexal regions with a large amount of soft tissue throughout the peritoneal cavity consistent with carcinomatosis.  She received 4 cycles of neoadjuvant carboplatin and Taxol and ultimately underwent total abdominal hysterectomy, bilateral salpingo-oophorectomy omentectomy and debulking, rectosigmoid resection and creation of an end colostomy, cholecystectomy on 06/15/2015.  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  Her most recent CT abdomen and pelvis with contrast and 06/10/2016 shows question of peritoneal carcinomatosis with worsening bilateral obstructive uropathy possibly secondary to involvement of the ureters.  Her creatinine has risen mildly from 0.53 days ago now up to 0.88 today.     PMH: Past Medical History:  Diagnosis Date  . CAD (coronary artery disease)    a. 09/2012 Cath: LM nl, LAD 95p, 63m LCX 956mOM2 50, RCA 100.  . Marland Kitchenholelithiasis   . Chronic diastolic CHF (congestive heart failure) ([facility]  . Collagen vascular disease ([facility] [place]  . Esophageal stricture   . Exertional shortness of breath   . GERD (gastroesophageal reflux disease)   . H/O hiatal hernia   . Herniated disc   . History of pancytopenia   . Hyperlipidemia   . Hypertension   . Hypokalemia   . Leukopenia 2012   s/p bone marrow biopsy, Dr. PaMa Hillock. NSTEMI (non-ST elevated myocardial infarction) ([facility]/2014   "mild" (10/18/2012)  . Ovarian cancer ([facility]   chemo  . Pneumonia 2013; 08/2012   "one lung; double"  (10/18/2012)  . Rheumatoid arthritis(714.0)   . Type I diabetes mellitus ([facility]   "dx'd in 1957" (10/18/2012)    Surgical History: Past Surgical History:  Procedure Laterality Date  . ABDOMINAL HYSTERECTOMY  06/15/2015   Dx L/S, EXLAP TAH BSO omentectomy RSRx colostomy diaphragm resection stripping  . CARDIAC CATHETERIZATION  10/18/2012   "first one was today" (10/18/2012)  . CATARACT EXTRACTION W/ INTRAOCULAR LENS IMPLANT Right 2010  . CHOLECYSTECTOMY  06/15/2015   combined case with ovarian cancer debulking  . COLON SURGERY    . CORONARY ARTERY BYPASS GRAFT N/A 10/19/2012   Procedure: CORONARY ARTERY BYPASS GRAFTING (CABG);  Surgeon: StMelrose NakayamaMD;  [facility] Service: Open Heart Surgery;  Laterality: N/A;  . ESOPHAGEAL DILATION     "3 or 4 times" (10/19/2011)  . ESOPHAGOGASTRODUODENOSCOPY  2012   Dr. IfMinna Merritts. OSTOMY    . OVARY SURGERY     removal  . PERIPHERAL VASCULAR CATHETERIZATION N/A 03/02/2015   Procedure: PoGlori Luisath Insertion;  Surgeon: JaAlgernon HuxleyMD;  [facility];  Service: Cardiovascular;  Laterality: N/A;  . TUBAL LIGATION  1970    Home Medications:  Allergies as of 06/21/2016      Reactions   Codeine Nausea And Vomiting   Latex Rash      Medication List       Accurate as of 06/21/16 12:16 PM. Always use your most recent med list.          atorvastatin 40 MG tablet Commonly known as:  LIPITOR Take 1 tablet (  40 mg total) by mouth daily at 6 PM.   Calcium-Vitamin D 600-200 MG-UNIT tablet Take 2 tablets by mouth daily.   enoxaparin 60 MG/0.6ML injection Commonly known as:  LOVENOX Inject 0.6 mLs (60 mg total) into the skin every 12 (twelve) hours.   folic acid 1 MG tablet Commonly known as:  FOLVITE TAKE 1 TABLET BY MOUTH ONCE A DAY   insulin lispro 100 UNIT/ML injection Commonly known as:  HUMALOG Inject 0.06 mLs (6 Units total) into the skin daily.   lidocaine-prilocaine cream Commonly known as:  EMLA APPLY  1 APPLICATION TOPICALLY AS NEEDED 1 HOUR PRIOR TO TREATMENT. COVER WITH PRESS N SEAL UNTIL TREATMENT TIME AS DIRECTED   loratadine 10 MG tablet Commonly known as:  CLARITIN Take 1 tablet by mouth daily as needed.   losartan 100 MG tablet Commonly known as:  COZAAR Take 1 tablet (100 mg total) by mouth daily.   magnesium oxide 400 (241.3 Mg) MG tablet Commonly known as:  MAG-OX TAKE 1 TABLET BY MOUTH ONCE A DAY   metoprolol tartrate 25 MG tablet Commonly known as:  LOPRESSOR Take 1 tablet (25 mg total) by mouth 2 (two) times daily.   ondansetron 8 MG tablet Commonly known as:  ZOFRAN TAKE 1 TABLET BY MOUTH EVERY 8 HOURS AS NEEDED FOR NAUSEA OR VOMITING   ONE TOUCH ULTRA TEST test strip Generic drug:  glucose blood   pantoprazole 40 MG tablet Commonly known as:  PROTONIX TAKE 1 TABLET BY MOUTH TWICE A DAY   potassium chloride SA 20 MEQ tablet Commonly known as:  K-DUR,KLOR-CON Take 1 tablet (20 mEq total) by mouth daily.   predniSONE 5 MG tablet Commonly known as:  DELTASONE Take 5 mg by mouth daily with breakfast.   ranitidine 150 MG tablet Commonly known as:  ZANTAC Take 1 tablet (150 mg total) by mouth 2 (two) times daily.   vitamin C 500 MG tablet Commonly known as:  ASCORBIC ACID Take 500 mg by mouth daily.   zinc gluconate 50 MG tablet Take 50 mg by mouth daily.       Allergies:  Allergies  Allergen Reactions  . Codeine Nausea And Vomiting  . Latex Rash    Family History: Family History  Problem Relation Age of Onset  . Diabetes Mother   . Arthritis Mother   . Diabetes Father   . Arthritis Father   . Bone cancer Sister     Social History:  reports that she quit smoking about 22 years ago. Her smoking use included Cigarettes. She has a 30.00 pack-year smoking history. She has never used smokeless tobacco. She reports that she does not drink alcohol or use drugs.  ROS:                                        Physical  Exam: There were no vitals taken for this visit.  Constitutional:  Alert and oriented, No acute distress. HEENT: [place] AT, moist mucus membranes.  Trachea midline, no masses. Cardiovascular: No clubbing, cyanosis, or edema. Respiratory: Normal respiratory effort, no increased work of breathing. GI: Abdomen is soft, nontender, nondistended, no abdominal masses GU: No CVA tenderness. *** Skin: No rashes, bruises or suspicious lesions. Lymph: No cervical or inguinal adenopathy. Neurologic: Grossly intact, no focal deficits, moving all 4 extremities. Psychiatric: Normal mood and affect.  Laboratory Data: Lab Results  Component Value Date  WBC 2.4 (L) 06/21/2016   HGB 9.4 (L) 06/21/2016   HCT 27.0 (L) 06/21/2016   MCV 95.0 06/21/2016   PLT 205 06/21/2016    Lab Results  Component Value Date   CREATININE 0.88 06/21/2016     Lab Results  Component Value Date   HGBA1C 6.5 (H) 05/28/2016    Urinalysis    Component Value Date/Time   COLORURINE STRAW (A) 05/26/2016 0531   APPEARANCEUR CLEAR (A) 05/26/2016 0531   APPEARANCEUR Clear 09/21/2012 1210   LABSPEC 1.011 05/26/2016 0531   LABSPEC 1.017 09/21/2012 1210   PHURINE 6.0 05/26/2016 0531   GLUCOSEU 50 (A) 05/26/2016 0531   GLUCOSEU >=500 09/21/2012 1210   HGBUR NEGATIVE 05/26/2016 0531   BILIRUBINUR NEGATIVE 05/26/2016 0531   BILIRUBINUR Negative 09/21/2012 1210   KETONESUR 5 (A) 05/26/2016 0531   PROTEINUR NEGATIVE 05/26/2016 0531   UROBILINOGEN 0.2 10/18/2012 2240   NITRITE NEGATIVE 05/26/2016 0531   LEUKOCYTESUR NEGATIVE 05/26/2016 0531   LEUKOCYTESUR Negative 09/21/2012 1210    Pertinent Imaging: CLINICAL DATA:  Stage IV ovarian cancer presenting for restaging after recent chemotherapy. TAHBSO with omentectomy, colostomy and diaphragmatic stripping 06/15/2015.  EXAM: CT ABDOMEN AND PELVIS WITH CONTRAST  TECHNIQUE: Multidetector CT imaging of the abdomen and pelvis was performed using the standard protocol  following bolus administration of intravenous contrast.  CONTRAST:  163m ISOVUE-300 IOPAMIDOL (ISOVUE-300) INJECTION 61%  COMPARISON:  03/08/2016 CT abdomen/pelvis.  FINDINGS: Lower chest: Stable 4 mm subpleural posterior right lower lobe solid pulmonary nodule (series 4/ image 1). Pleural 4 mm nodule in the medial basilar right pleural space (series 4/image 16) is stable. Small left dependent basilar left pleural effusion, stable. Stable mild cardiomegaly and three-vessel coronary atherosclerosis. The tip of a superior approach central venous catheter is seen at the cavoatrial junction. Visualized lower median sternotomy wires appear intact.  Hepatobiliary: Several perihepatic space tumor implants are noted, several of which have increased in size, and a few of which are stable in size. For example, a right subdiaphragmatic 2.5 x 1.7 cm implant (series 2/image 7), previously 2.4 x 1.3 cm, mildly increased. The right perihepatic space 1.3 x 1.1 cm implant (series 2/ image 21), previously 1.2 x 0.8 cm, mildly increased. A 1.8 x 1.2 cm superior right hepatorenal space implant (series 2/image 18), previously 1.0 x 0.8 cm, increased. A 1.1 x 1.0 cm inferior right hepatorenal space implant (series 2/ image 34), previously 1.0 x 1.0 cm, stable. An inferior right perihepatic space 4.0 x 2.8 cm mixed cystic and solid implant (series 2/ image 41), previously 2.2 x 1.5 cm, increased. Inferior right liver lobe 1.2 x 0.9 cm capsular implant (series 2/ image 35), previously 0.8 x 0.7 cm, increased. Implant along the intersegmental fissure in the left liver lobe measures 2.6 x 2.3 cm (series 2/ image 25), previously 2.6 x 2.5 cm, stable. Cholecystectomy. Stable mild diffuse intrahepatic biliary ductal dilatation and common bile duct diameter 7 mm, within normal post cholecystectomy limits. Stable 1.4 cm ampullary mass projecting into the duodenal lumen (series 2/ image 36).  Pancreas:  Normal, with no mass or duct dilation.  Spleen: Normal size. No mass.  Adrenals/Urinary Tract: Normal adrenals. Mild right hydroureteronephrosis to the level of the right pelvic ureter at the site of right lateral pelvic wall thickening (series 2/ image 66), worsened. New mild left hydroureteronephrosis to the level of the left pelvic ureter at the site of a new mixed cystic and solid 2.3 x 1.7 cm left pelvic sidewall implant (series  2/ image 68). No renal masses.  Stomach/Bowel: Small hiatal hernia. Otherwise grossly normal stomach. Normal caliber small bowel with no small bowel wall thickening. Stable postsurgical changes from partial distal colectomy with ventral left abdominal wall end colostomy. Collapsed blind-ending rectal remnant. No large bowel wall thickening or pericolonic fat stranding.  Vascular/Lymphatic: Atherosclerotic nonaneurysmal abdominal aorta. Patent portal, splenic, hepatic and renal veins. No pathologically enlarged lymph nodes in the abdomen or pelvis.  Reproductive: Status post hysterectomy and bilateral oophorectomy.  Other: No pneumoperitoneum. Small volume perihepatic ascites is decreased. No free fluid in the pelvis. Stable 3.8 x 2.5 cm right lower quadrant peritoneal implant (series 2/ image 52), previously 3.8 x 2.6 cm. Several peritoneal implants throughout the pelvis have increased in size, for example a 2.6 x 2.2 cm right upper pelvic implant (series 2/ image 61), increased from 1.8 x 1.4 cm, and a solid 2.2 x 1.7 cm deep pelvic implant (series 2/ image 69), increased from 2.0 x 1.5 cm. Large mixed cystic and solid 5.9 x 3.3 cm left lower quadrant implant (series 2/image 50), significantly increased from 2.6 x 1.4 cm.  Musculoskeletal: No aggressive appearing focal osseous lesions. Marked thoracolumbar spondylosis. Stable chronic compression deformity of the T12 vertebral body.  IMPRESSION: 1. Progression of peritoneal carcinomatosis  predominantly in the perihepatic space, bilateral lower quadrants and pelvis. 2. Worsening bilateral obstructive uropathy due to malignant involvement of the pelvic ureters. 3. Stable small pleural/subpleural nodules at the right lung base. Stable small left pleural effusion. 4. Decreased small volume perihepatic ascites. 5. Additional findings include aortic atherosclerosis, coronary atherosclerosis, stable nonspecific ampullary mass and small hiatal hernia.   Electronically Signed   By: Ilona Sorrel M.D.   On: 06/10/2016 10:37    Assessment & Plan:  ***  There are no diagnoses linked to this encounter.  No Follow-up on file.  Hollice Espy, MD  [facility] [address] [place], [place] [address] [phone]

## 2016-06-21 NOTE — Progress Notes (Signed)
Patient seeing urologist today for problems she is having with urination. Offers no complaints today.

## 2016-06-21 NOTE — Patient Instructions (Signed)
Doxorubicin Liposomal injection What is this medicine? LIPOSOMAL DOXORUBICIN (LIP oh som al dox oh ROO bi sin) is a chemotherapy drug. This medicine is used to treat many kinds of cancer like Kaposi's sarcoma, multiple myeloma, and ovarian cancer. This medicine may be used for other purposes; ask your health care provider or pharmacist if you have questions. COMMON BRAND NAME(S): Doxil, Lipodox What should I tell my health care provider before I take this medicine? They need to know if you have any of these conditions: -blood disorders -heart disease -infection (especially a virus infection such as chickenpox, cold sores, or herpes) -liver disease -recent or ongoing radiation therapy -an unusual or allergic reaction to doxorubicin, other chemotherapy agents, soybeans, other medicines, foods, dyes, or preservatives -pregnant or trying to get pregnant -breast-feeding How should I use this medicine? This drug is given as an infusion into a vein. It is administered in a hospital or clinic by a specially trained health care professional. If you have pain, swelling, burning or any unusual feeling around the site of your injection, tell your health care professional right away. Talk to your pediatrician regarding the use of this medicine in children. Special care may be needed. Overdosage: If you think you have taken too much of this medicine contact a poison control center or emergency room at once. NOTE: This medicine is only for you. Do not share this medicine with others. What if I miss a dose? It is important not to miss your dose. Call your doctor or health care professional if you are unable to keep an appointment. What may interact with this medicine? Do not take this medicine with any of the following medications: -zidovudine This medicine may also interact with the following medications: -medicines to increase blood counts like filgrastim, pegfilgrastim, sargramostim -vaccines Talk to  your doctor or health care professional before taking any of these medicines: -acetaminophen -aspirin -ibuprofen -ketoprofen -naproxen This list may not describe all possible interactions. Give your health care provider a list of all the medicines, herbs, non-prescription drugs, or dietary supplements you use. Also tell them if you smoke, drink alcohol, or use illegal drugs. Some items may interact with your medicine. What should I watch for while using this medicine? Your condition will be monitored carefully while you are receiving this medicine. You will need important blood work done while you are taking this medicine. This drug may make you feel generally unwell. This is not uncommon, as chemotherapy can affect healthy cells as well as cancer cells. Report any side effects. Continue your course of treatment even though you feel ill unless your doctor tells you to stop. Your urine may turn orange-red for a few days after your dose. This is not blood. If your urine is dark or brown, call your doctor. In some cases, you may be given additional medicines to help with side effects. Follow all directions for their use. Call your doctor or health care professional for advice if you get a fever (100.5 degrees F or higher), chills or sore throat, or other symptoms of a cold or flu. Do not treat yourself. This drug decreases your body's ability to fight infections. Try to avoid being around people who are sick. This medicine may increase your risk to bruise or bleed. Call your doctor or health care professional if you notice any unusual bleeding. Be careful brushing and flossing your teeth or using a toothpick because you may get an infection or bleed more easily. If you have any dental  work done, tell your dentist you are receiving this medicine. Avoid taking products that contain aspirin, acetaminophen, ibuprofen, naproxen, or ketoprofen unless instructed by your doctor. These medicines may hide a  fever. Men and women of childbearing age should use effective birth control methods while using taking this medicine. Do not become pregnant while taking this medicine. There is a potential for serious side effects to an unborn child. Talk to your health care professional or pharmacist for more information. Do not breast-feed an infant while taking this medicine. Talk to your doctor about your risk of cancer. You may be more at risk for certain types of cancers if you take this medicine. What side effects may I notice from receiving this medicine? Side effects that you should report to your doctor or health care professional as soon as possible: -allergic reactions like skin rash, itching or hives, swelling of the face, lips, or tongue -low blood counts - this medicine may decrease the number of white blood cells, red blood cells and platelets. You may be at increased risk for infections and bleeding. -signs of hand-foot syndrome - tingling or burning, redness, flaking, swelling, small blisters, or small sores on the palms of your hands or the soles of your feet -signs of infection - fever or chills, cough, sore throat, pain or difficulty passing urine -signs of decreased platelets or bleeding - bruising, pinpoint red spots on the skin, black, tarry stools, blood in the urine -signs of decreased red blood cells - unusually weak or tired, fainting spells, lightheadedness -back pain, chills, facial flushing, fever, headache, tightness in the chest or throat during the infusion -breathing problems -chest pain -fast, irregular heartbeat -mouth pain, redness, sores -pain, swelling, redness at site where injected -pain, tingling, numbness in the hands or feet -swelling of ankles, feet, or hands -vomiting Side effects that usually do not require medical attention (report to your doctor or health care professional if they continue or are bothersome): -diarrhea -hair loss -loss of appetite -nail  discoloration or damage -nausea -red or watery eyes -red colored urine -stomach upset This list may not describe all possible side effects. Call your doctor for medical advice about side effects. You may report side effects to FDA at 1-800-FDA-1088. Where should I keep my medicine? This drug is given in a hospital or clinic and will not be stored at home. NOTE: This sheet is a summary. It may not cover all possible information. If you have questions about this medicine, talk to your doctor, pharmacist, or health care provider.  2017 [employer]vier/Gold Standard (2012-01-06 10:12:56)

## 2016-06-21 NOTE — Progress Notes (Signed)
[facility] day:  06/21/2016   Chief Complaint: [name] is a 71 y.o. female with stage IV ovarian cancer who is seen for assessment prior to cycle #1 Doxil.  HPI:  The patient was last seen in the medical oncology clinic on 06/14/2016.  At that time, imaging studies revealed progressive disease.  We discuss discontinuation of carboplatin and gemcitabine.  We discussed single agent Doxil with Neulasta support.  The addition of Avastin was not felt to be an option secondary to her recent CVA on Eliquis.  Echo on 05/27/2016 revealed an EF of 60-65%.  There were no available local clinical trials.  Dr. Theora Gianotti was contacted.  She agreed with the plan.  The patient sees Dr. Erlene Quan this afternoon regarding her obstructive nephropathy.  Symptomatically, she denies any complaints.   Past Medical History:  Diagnosis Date  . CAD (coronary artery disease)    a. 09/2012 Cath: LM nl, LAD 95p, 75m LCX 913mOM2 50, RCA 100.  . Marland Kitchenholelithiasis   . Chronic diastolic CHF (congestive heart failure) ([place]  . Collagen vascular disease ([facility]  . Esophageal stricture   . Exertional shortness of breath   . GERD (gastroesophageal reflux disease)   . H/O hiatal hernia   . Herniated disc   . History of pancytopenia   . Hyperlipidemia   . Hypertension   . Hypokalemia   . Leukopenia 2012   s/p bone marrow biopsy, Dr. PaMa Hillock. NSTEMI (non-ST elevated myocardial infarction) ([place]   "mild" (10/18/2012)  . Ovarian cancer ([facility]2016   chemo  . Pneumonia 2013; 08/2012   "one lung; double" (10/18/2012)  . Rheumatoid arthritis(714.0)   . Type I diabetes mellitus ([place]   "dx'd in 1957" (10/18/2012)    Past Surgical History:  Procedure Laterality Date  . ABDOMINAL HYSTERECTOMY  06/15/2015   Dx L/S, EXLAP TAH BSO omentectomy RSRx colostomy diaphragm resection stripping  . CARDIAC CATHETERIZATION  10/18/2012   "first one was today" (10/18/2012)  . CATARACT  EXTRACTION W/ INTRAOCULAR LENS IMPLANT Right 2010  . CHOLECYSTECTOMY  06/15/2015   combined case with ovarian cancer debulking  . COLON SURGERY    . CORONARY ARTERY BYPASS GRAFT N/A 10/19/2012   Procedure: CORONARY ARTERY BYPASS GRAFTING (CABG);  Surgeon: StMelrose NakayamaMD;  Location: [facility] Service: Open Heart Surgery;  Laterality: N/A;  . ESOPHAGEAL DILATION     "3 or 4 times" (10/19/2011)  . ESOPHAGOGASTRODUODENOSCOPY  2012   Dr. IfMinna Merritts. OSTOMY    . OVARY SURGERY     removal  . PERIPHERAL VASCULAR CATHETERIZATION N/A 03/02/2015   Procedure: PoGlori Luisath Insertion;  Surgeon: JaAlgernon HuxleyMD;  [place] [facility];  Service: Cardiovascular;  Laterality: N/A;  . TUBAL LIGATION  1970    Family History  Problem Relation Age of Onset  . Diabetes Mother   . Arthritis Mother   . Diabetes Father   . Arthritis Father   . Bone cancer Sister     Social History:  reports that she quit smoking about 22 years ago. Her smoking use included Cigarettes. She has a 30.00 pack-year smoking history. She has never used smokeless tobacco. She reports that she does not drink alcohol or use drugs.  She is accompanied by her husband today.  Allergies:  Allergies  Allergen Reactions  . Codeine Nausea And Vomiting  . Latex Rash    Current Medications: Current  Outpatient Prescriptions  Medication Sig Dispense Refill  . Calcium-Vitamin D 600-200 MG-UNIT tablet Take 2 tablets by mouth daily.     Marland Kitchen enoxaparin (LOVENOX) 60 MG/0.6ML injection Inject 0.6 mLs (60 mg total) into the skin every 12 (twelve) hours. 60 Syringe 1  . folic acid (FOLVITE) 1 MG tablet TAKE 1 TABLET BY MOUTH ONCE A DAY 90 tablet 3  . insulin lispro (HUMALOG) 100 UNIT/ML injection Inject 0.06 mLs (6 Units total) into the skin daily. (Patient taking differently: Inject 0-85 Units into the skin daily. Via insulin pump) 30 mL 3  . lidocaine-prilocaine (EMLA) cream APPLY 1 APPLICATION TOPICALLY AS NEEDED 1 HOUR PRIOR TO  TREATMENT. COVER WITH PRESS N SEAL UNTIL TREATMENT TIME AS DIRECTED 30 g 1  . loratadine (CLARITIN) 10 MG tablet Take 1 tablet by mouth daily as needed.    Marland Kitchen losartan (COZAAR) 100 MG tablet Take 1 tablet (100 mg total) by mouth daily. 90 tablet 1  . magnesium oxide (MAG-OX) 400 (241.3 Mg) MG tablet TAKE 1 TABLET BY MOUTH ONCE A DAY 30 tablet 0  . metoprolol tartrate (LOPRESSOR) 25 MG tablet Take 1 tablet (25 mg total) by mouth 2 (two) times daily. 180 tablet 3  . ondansetron (ZOFRAN) 8 MG tablet TAKE 1 TABLET BY MOUTH EVERY 8 HOURS AS NEEDED FOR NAUSEA OR VOMITING 30 tablet 1  . ONE TOUCH ULTRA TEST test strip     . pantoprazole (PROTONIX) 40 MG tablet TAKE 1 TABLET BY MOUTH TWICE A DAY 180 tablet 1  . potassium chloride SA (K-DUR,KLOR-CON) 20 MEQ tablet Take 1 tablet (20 mEq total) by mouth daily. (Patient taking differently: Take 40 mEq by mouth daily. ) 20 tablet 0  . predniSONE (DELTASONE) 5 MG tablet Take 5 mg by mouth daily with breakfast.    . ranitidine (ZANTAC) 150 MG tablet Take 1 tablet (150 mg total) by mouth 2 (two) times daily. 180 tablet 1  . vitamin C (ASCORBIC ACID) 500 MG tablet Take 500 mg by mouth daily.    Marland Kitchen zinc gluconate 50 MG tablet Take 50 mg by mouth daily.    Marland Kitchen atorvastatin (LIPITOR) 40 MG tablet Take 1 tablet (40 mg total) by mouth daily at 6 PM. (Patient not taking: Reported on 06/14/2016) 30 tablet 2   Current Facility-Administered Medications  Medication Dose Route Frequency Provider Last Rate Last Dose  . Tbo-Filgrastim (GRANIX) injection 300 mcg  300 mcg Subcutaneous Once Lequita Asal, MD       Facility-Administered Medications Ordered in Other Visits  Medication Dose Route Frequency Provider Last Rate Last Dose  . magnesium sulfate IVPB 4 g 100 mL  4 g Intravenous Once Lequita Asal, MD      . sodium chloride 0.9 % injection 10 mL  10 mL Intracatheter PRN Lequita Asal, MD      . sodium chloride 0.9 % injection 10 mL  10 mL Intravenous PRN  Lequita Asal, MD   10 mL at 04/13/15 0347    Review of Systems:  GENERAL:  Feels good.  No fever, chills or sweats.  Weight up 2 pounds. PERFORMANCE STATUS (ECOG):  1 HEENT:  No visual changes, sore throat, mouth sores or tenderness. Lungs:  No shortness of breath or cough.  No hemoptysis. Cardiac:  No chest pain, palpitations, orthopnea, or PND. GI:  No abdominal pain or distention.  No nausea, vomiting, diarrhea, melena or hematochezia. GU:  No urgency, frequency, dysuria, or hematuria. Musculoskeletal:  Severe rheumatoid  arthritis (off MTX and on prednisone).  Osteoporosis.  No back pain. No muscle tenderness. Extremities:  No pain or swelling. Skin:  No increased bruising or bleeding.  No rashes or skin changes. Neuro:  Neuropathy (stable).  No headache, numbness or weakness, balance or coordination issues. Endocrine:  Diabetes on an insulin pump.  No thyroid issues, hot flashes or night sweats. Psych:  No mood changes, depression or anxiety. Pain: No pain. Review of systems:  All other systems reviewed and found to be negative.  Physical Exam: Blood pressure 127/69, pulse 69, temperature 98.7 F (37.1 C), temperature source Tympanic, resp. rate 18, weight 140 lb 6.9 oz (63.7 kg). GENERAL:  Well developed, well nourished woman sitting comfortably in the exam room in no acute distress. MENTAL STATUS:  Alert and oriented to person, place and time. HEAD:  Curly gray hair.  Normocephalic, atraumatic, face symmetric, no Cushingoid features. EYES:  Gold rimmed glasses.  Blue eyes.  No conjunctivitis or scleral icterus.  ENT: Oropharynx clear without lesion. Tongue normal. Mucous membranes moist.  RESPIRATORY: Clear to auscultation without rales, wheezes or rhonchi. CARDIOVASCULAR: Regular rate and rhythm without murmur, rub or gallop. ABDOMEN: Soft, non-tender with active bowel sounds and no hepatosplenomegaly. No palpable nodularity or masses.  No shifting dullness.  Insulin  pump. SKIN: No rashes, ulcers, or bruises. EXTREMITIES:  No edema, skin discoloration or tenderness. No palpable cords. LYMPH NODES: No palpable cervical, supraclavicular, axillary or inguinal adenopathy  NEUROLOGICAL: Appropriate. PSYCH: Appropriate.   Infusion on 06/21/2016  Component Date Value Ref Range Status  . WBC 06/21/2016 2.4* 3.6 - 11.0 K/uL Final  . RBC 06/21/2016 2.85* 3.80 - 5.20 MIL/uL Final  . Hemoglobin 06/21/2016 9.4* 12.0 - 16.0 g/dL Final  . HCT 06/21/2016 27.0* 35.0 - 47.0 % Final  . MCV 06/21/2016 95.0  80.0 - 100.0 fL Final  . MCH 06/21/2016 33.2  26.0 - 34.0 pg Final  . MCHC 06/21/2016 34.9  32.0 - 36.0 g/dL Final  . RDW 06/21/2016 18.9* 11.5 - 14.5 % Final  . Platelets 06/21/2016 205  150 - 440 K/uL Final  . Neutrophils Relative % 06/21/2016 64  % Final  . Neutro Abs 06/21/2016 1.5  1.4 - 6.5 K/uL Final  . Lymphocytes Relative 06/21/2016 17  % Final  . Lymphs Abs 06/21/2016 0.4* 1.0 - 3.6 K/uL Final  . Monocytes Relative 06/21/2016 16  % Final  . Monocytes Absolute 06/21/2016 0.4  0.2 - 0.9 K/uL Final  . Eosinophils Relative 06/21/2016 2  % Final  . Eosinophils Absolute 06/21/2016 0.0  0 - 0.7 K/uL Final  . Basophils Relative 06/21/2016 1  % Final  . Basophils Absolute 06/21/2016 0.0  0 - 0.1 K/uL Final  . Sodium 06/21/2016 134* 135 - 145 mmol/L Final  . Potassium 06/21/2016 4.4  3.5 - 5.1 mmol/L Final  . Chloride 06/21/2016 100* 101 - 111 mmol/L Final  . CO2 06/21/2016 28  22 - 32 mmol/L Final  . Glucose, Bld 06/21/2016 232* 65 - 99 mg/dL Final  . BUN 06/21/2016 20  6 - 20 mg/dL Final  . Creatinine, Ser 06/21/2016 0.88  0.44 - 1.00 mg/dL Final  . Calcium 06/21/2016 8.9  8.9 - 10.3 mg/dL Final  . Total Protein 06/21/2016 6.9  6.5 - 8.1 g/dL Final  . Albumin 06/21/2016 3.4* 3.5 - 5.0 g/dL Final  . AST 06/21/2016 18  15 - 41 U/L Final  . ALT 06/21/2016 15  14 - 54 U/L Final  . Alkaline  Phosphatase 06/21/2016 57  38 - 126 U/L Final  . Total Bilirubin  06/21/2016 0.4  0.3 - 1.2 mg/dL Final  . GFR calc non Af Amer 06/21/2016 >60  >60 mL/min Final  . GFR calc Af Amer 06/21/2016 >60  >60 mL/min Final   Comment: (NOTE) The eGFR has been calculated using the CKD EPI equation. This calculation has not been validated in all clinical situations. eGFR's persistently <60 mL/min signify possible Chronic Kidney Disease.   . Anion gap 06/21/2016 6  5 - 15 Final  . Magnesium 06/21/2016 1.4* 1.7 - 2.4 mg/dL Final    Assessment:  [name] is a 71 y.o. female with stage IV ovarian cancer.  She presented with abdominal discomfort and bloating.  Omental biopsy on 02/23/2015 revealed metastatic high grade serous carcinoma, consistent with gynecologic origin.   She was initially diagnosed with clinical stage IIIC (T3cN1Mx).  CA125 was 707 on 02/17/2015.  Abdomen and pelvic CT scan on 02/13/2015 revealed bilateral mass-like adnexal regions (right adnexal mass 5.6 x 5.0 cm and the left adnexa mass 10.3 x 6.0 x 9.9 cm).  There was a large amount of soft tissue throughout the peritoneal cavity involving the omentum and other peritoneal surfaces.  There was a small volume ascites. There was a peripheral 3.3 x 1.9 cm low-attenuation lesion overlying the right lobe of the liver, likely representing a serosal implant. There was 1.4 x 2.2 cm ill-defined peripheral lesion within the inferior aspect of segment 6 adjacent to the ampulla in the duodenum.   She received 4 cycles of neoadjuvant carboplatin and Taxol (03/05/2015 - 05/22/2015).  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  She had loose stools on oral magnesium.  She was initially on Neurontin then switched Lyrica with cycle #3.  Cycle #4 was notable for neutropenia (ANC 300) requiring GCSF x 3 days.    CA125 was 802.9 on 03/30/2015, 567.9 on 04/13/2015, 168.8 on 05/15/2015, 85.2 on 07/17/2015, 68.6 on 07/28/2015, 34.5 on 09/17/2015, 20.7 on 11/06/2015, 49 on 01/22/2016, 106.1 on 02/22/2016, 138.8 on  03/07/2016, 220.8 on 03/21/2016, 152.8 on 04/11/2016, 125.6 on 04/18/2016, 76.8 on 05/03/2016, and 60.2 on 05/24/2016.  Abdominal and pelvic CT scan on 04/28/2015 revealed decreasing bilateral ovarian masses.   The left adnexal mass measured 4.0 x 6.1 cm (previously 5.0 x 8.5 cm). The right ovary measured 3.8 x 4.9 cm (previously 4.7 x 5.6 cm).  There was improved peritoneal carcinomatosis.  There was a small amount of ascites.  The hepatic dome lesion was stable. The previously seen right hepatic lobe lesion was not well-visualized.  There was a small left pleural effusion and trace right pleural effusion.  The nodular lesion at the ampulla of Vater, extending into the duodenum was stable.  There was no biliary ductal dilatation.  She underwent exploratory laparotomy, lysis of adhesions, total abdominal hysterectomy with bilateral salpingo-oophorectomy, infracolic omentectomy, optimal tumor debulking(< 1 cm), recto-sigmoid resection with creation of end colostomy, cholecystectomy, mobilization of splenic flexure and liver with diaphragmatic stripping on 06/15/2015. The right diaphragm was cleared of tumor. During dissection, the diaphragm was entered and closed with sutures.   She has had a recurrent right sided pleural effusion.  She underwent thoracentesis of 650 cc on post-operative day 3.  She was admitted to [facility] on 07/01/2015 and 07/07/2015 for recurrent shortness of breath.  She underwent 2 additional thoracentesis (1.1 L on 07/02/2015 and 850 cc on 07/08/2015).  Cytology was negative x 2.  Bilateral lower extremity duplex  on 07/03/2015 was negative.  Echo revealed an EF of 55-60% on 07/08/2015 and 60-65% on 05/27/2016.    She has severe rheumatoid arthritis.  Methotrexate and Enbrel were initially on hold.  She has a normocytic anemia.  Work-up on 02/17/2015 and 05/24/2016 revealed a normal ferritin, B12, folate, TSH.  She denies any melena or hematochezia.    She has anemia due to chronic  disease. She received 1 unit PRBCs during her admission at [facility]. She denies any melena or hematochezia. She has diabetes and is on an insulin pump.  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 She was on Lovenox 60 mg twice a day.  She switched to Eliquis on 04/16/2016.  She was admitted to [facility] from 07/29/2015 - 08/10/2015 with a right-sided empyema and liver abscess. She underwent CT-guided placement of a liver abscess drain on 07/30/2015. Liver abscess culture grew out group B strep and Enterobacter which was sensitive to Zosyn. She was transitioned to ertapenem Colbert Ewing) prior to discharge.  She was readmitted to [facility] on 09/17/2015.  Chest, abdomen, and pelvic CT scan on 09/01/2015 revealed continued decrease in perihepatic fluid collection contiguous with the right pleural space with percutaneous drain. Drain was removed on 09/01/2015.  Chest, abdomen, and pelvic CT scan on 09/11/2015 revealed a residual versus recurrent fluid collection posterior to the right hepatic lobe (2.6 x 1.1 x 4.0 cm).  Chest, abdomen, and pelvic CT scan on 10/13/2015 revealed resolution of the empyema.    She has had persistent neutropenia felt secondary to her rheumatoid arthritis.  Folate and MMA were normal.  TSH was 6.13 (high) with a free T4 of 1.17 (0.61-1.12).  She began methotrexate (10 mg a week) and prednisone (5 mg a day) for severe rheumatoid arthritis on 12/17/2015.  She remains on prednisone alone (5 mg a day).  Bone marrow aspirate and biopsy on 06/09/2010 revealed a hypercellular marrow (70%) with no evidence of dysplasia or malignancy.  Flow cytometry was negative.  Cytogenetics were normal (46,XX).  FISH studies were negative for MDS.  Bone marrow aspirate and biopsy on 12/03/2015 revealed a normocellular to mildy hypercellular marrow for age (40%) with  left shifted myelopoiesis, non specific dyserythropoiesis and mild megakaryocytic atypia with no increase in blasts.  There were multiple small nonspecific lymphoid aggregates (favor reactive). There was no increase in reticulin.  There was decreased myeloid cells (37%) with left shifted maturation and 1% atypical myelod blasts.  There was relatively increased monocytic cells (11%), relatively increased lymphoid cells (36%), and relatively increased eosinophils (6%).  Cytogenetics were normal (1, XX).  SNP microarray was normal.  Chest, abdomen, and pelvic CT scan on 02/15/2016 revealed multiple soft tissue nodules throughout the pelvis, small bowel mesenteric and peritoneum and, concerning for peritoneal metastatic disease.  There was soft tissue irregularity within the left upper quadrant.  There was mild right hydronephrosis (etiology unclear).  There was interval resolution of previously described right pleural based fluid and gas collection. There was a pleural based nodule within the right lower hemi-thorax concerning for pleural based metastasis.  There were small bilateral pleural effusions.  There was pulmonary nodularity, predominately within the left upper lobe (metastatic or infectious/inflammatory etiology).  There was slightly increased mediastinal adenopathy (infectious/inflammatory or metastatic).  There was a low attenuation lesion within the left hepatic lobe (complicated fluid within the fissure or metastatic disease).  She  was on tamoxifen from 02/25/2016 - 03/14/2016.    Abdomen and pelvic CT scan on 03/08/2016 revealed mild progression of peritoneal disease in the abdomen.  There was interval increase in loculated fluid around the lateral segment left liver, stomach, and spleen.  There was persistent soft tissue lesion at the level of the ampulla with mild intra and extrahepatic biliary duct dilatation.  There was persistent intrahepatic and capsular metastatic disease involving the  liver.  She is s/p 4 cycles of carboplatin and gemcitabine (03/21/2016 - 05/24/2016) with GCSF/Neulasta support.  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  Adomen and pelvic CT scan on 06/10/2016 revealed progression of peritoneal carcinomatosis predominantly in the perihepatic space, bilateral lower quadrants and pelvis.  There was worsening bilateral obstructive uropathy due to malignant involvement of the pelvic ureters.  There was stable small pleural/subpleural nodules at the right lung base.  There was stable small left pleural effusion.  There was decreased small volume perihepatic ascites.  She was admitted to [facility] from 05/26/2016 - 05/28/2016 with altered mental status and an acute embolic CVA.  Head MRI on 05/26/2016 revealed multiple small areas of acute infarct involving the occipital parietal lobe bilaterally and the left lateral cerebeconllum,  consistent with posterior circulation emboli.  Work-up included a negative carotid ultrasound and echocardiogram.  Symptomatically, she feels good.  Counts are at patient's baseline (WBC 2400 with ANC 1500).  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  Plan: 1.  Labs today:  CBC with diff, CMP, Mg. 2.  Review plan for Doxil (40 mg/m2 IV) every 4 weeks with Neulasta support.  Side effects reviewed.  Information provided.  Patient consented to treatment. 3.  Cycle #1 Doxil today. 4.  Follow-up with Dr. Erlene Quan, urologist, today. 5.  RTC tomorrow for Neulasta. 6.  RTC in 1 week for labs (BMP, Mg) +/- IV magnesium. 7.  RTC in 2 weeks for MD assessment and labs (CBC with diff, BMP, Mg). 8.  RTC in 4 weeks for MD assessment, labs (CBC with diff, CMP, Mg, CA125, Mg), and cycle #2 Doxil.   Lequita Asal, MD  06/21/2016, 10:29 AM

## 2016-06-22 ENCOUNTER — Inpatient Hospital Stay: Payer: Medicare Other

## 2016-06-22 DIAGNOSIS — C569 Malignant neoplasm of unspecified ovary: Secondary | ICD-10-CM | POA: Diagnosis not present

## 2016-06-22 MED ORDER — POTASSIUM CHLORIDE 20 MEQ/100ML IV SOLN: 20.0000 meq | Freq: Once | INTRAVENOUS | Status: DC

## 2016-06-22 MED ORDER — PEGFILGRASTIM INJECTION 6 MG/0.6ML ~~LOC~~
6.0000 mg | PREFILLED_SYRINGE | Freq: Once | SUBCUTANEOUS | Status: AC
  Administered 2016-06-22: 6 mg via SUBCUTANEOUS
  Filled 2016-06-22: qty 0.6

## 2016-06-24 NOTE — Progress Notes (Addendum)
[name], [name] ([id number]) Visit Report for 06/16/2016 Chief Complaint Document Details Patient Name: [name], [name] Date of Service: 06/16/2016 3:30 PM Medical Record Number: [id number] Patient Account Number: [id number] Date of Birth/Sex: 05-07-1945 (70 y.o. Female) Treating RN: Baruch Gouty, RN, BSN, Velva Harman Primary Care Provider: Caryl Bis, ERIC Other Clinician: Referring Provider: Caryl Bis, ERIC Treating Provider/Extender: Frann Rider in Treatment: 24 Information Obtained from: Patient Chief Complaint Mrs. Flees presents for evaluation of her left posterior heel pressure ulcer Electronic Signature(s) Signed: 06/16/2016 4:04:40 PM By: Christin Fudge MD, FACS Entered By: Christin Fudge on 06/16/2016 16:04:40 [name] ([id number]) -------------------------------------------------------------------------------- Debridement Details Patient Name: [name] Date of Service: 06/16/2016 3:30 PM Medical Record Number: [id number] Patient Account Number: [id number] Date of Birth/Sex: 1945/12/09 (70 y.o. Female) Treating RN: Cornell Barman Primary Care Provider: Caryl Bis, ERIC Other Clinician: Referring Provider: Caryl Bis, ERIC Treating Provider/Extender: Frann Rider in Treatment: 24 Debridement Performed for Wound #2 Left Calcaneus Assessment: Performed By: Physician Christin Fudge, MD Debridement: Open Wound/Selective Debridement Selective Description: Pre-procedure Yes - 15:55 Verification/Time Out Taken: Start Time: 15:56 Level: Non-Viable Tissue Total Area Debrided (L x 0.4 (cm) x 0.5 (cm) = 0.2 (cm) W): Tissue and other Non-Viable, Skin material debrided: Instrument: Forceps, Scissors Bleeding: Minimum Hemostasis Achieved: Pressure End Time: 15:58 Procedural Pain: 1 Post Procedural Pain: 0 Response to Treatment: Procedure was tolerated well Post Debridement Measurements of Total Wound Length: (cm) 0.4 Stage: Category/Stage III Width: (cm)  0.5 Depth: (cm) 0.9 Volume: (cm) 0.141 Character of Wound/Ulcer Post Requires Further Debridement: Debridement Severity of Tissue Post Fat layer exposed Debridement: Post Procedure Diagnosis Same as Pre-procedure Electronic Signature(s) Signed: 06/20/2016 4:54:29 PM By: Gretta Cool, RN, BSN, Kim RN, BSN Signed: 06/23/2016 4:30:09 PM By: Christin Fudge MD, FACS [name], [name] ([id number]) Previous Signature: 06/16/2016 4:04:34 PM Version By: Christin Fudge MD, FACS Entered By: Gretta Cool RN, BSN, Kim on 06/20/2016 16:54:12 [name], [name] ([id number]) -------------------------------------------------------------------------------- HPI Details Patient Name: [name] Date of Service: 06/16/2016 3:30 PM Medical Record Number: [id number] Patient Account Number: [id number] Date of Birth/Sex: 11-04-1945 (70 y.o. Female) Treating RN: Afful, RN, BSN, Velva Harman Primary Care Provider: Caryl Bis, ERIC Other Clinician: Referring Provider: Caryl Bis, ERIC Treating Provider/Extender: Frann Rider in Treatment: 24 History of Present Illness Location: right heel Quality: Patient reports experiencing a dull pain to affected area(s). Severity: Patient states wound are getting worse. Duration: Patient has had the wound for <5 months prior to presenting for treatment Timing: Pain in wound is Intermittent (comes and goes Context: The wound appeared gradually over time Modifying Factors: Consults to this date include: local care was done as per the medication given by the PCP which may have been medihoney Associated Signs and Symptoms: Patient reports having difficulty standing for long periods. 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 In the past she was seen for a decubitus ulcer on her right  gluteal area. She is known to have diabetes mellitus type 1 and her most recent A1c was 7.2% Past medical history significant for leukopenia, cholelithiasis, hypertension, chronic diastolic CHF, coronary artery disease,type 1 diabetes mellitus, rheumatoid arthritis, collagen vascular disease, ovarian cancer, status post CABG in 2014 and status post Port-A-Cath insertion by Dr. Leotis Pain in October 2016. She has quit smoking about 20 years ago. She was advised to use Medihoney with calcium  alginate pads to be applied over the wound. 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 01/07/2016 -- x-ray of the left heel done -- IMPRESSION: No radiographic evidence of osteomyelitis. If this remains a clinical concern, recommend MR imaging. 02/05/16: returns today for f/u. denies fever, chills, body aches or malaise. no interval changes regarding health status. 02/12/16: pt is doing well. wound has improved. no s/s of infection. no systemic s/s of infection. She reports will be on vacation next week, and requests appointment in 2 weeks. 03/18/2016 - is going to be starting her chemotherapy this coming Monday and will have cycles every 3 weeks 04-01-16 [name], accompanied by her husband, presents for evaluation of her left posterior heel stage III pressure ulcer. She states that she started chemotherapy last week. Her chemotherapy is on a cycle of one week on 3 weeks off, with her next treatment scheduled for December 11. There has been an increase in drainage since her last appointment. She denies any pain. They have been using PolyMem silver rope. [name], [name] ([id number]) 04/22/16 - [name]  presents today, accompanied by her husband, for a violation of her left posterior low heel pressure ulcer. She continues to receive chemotherapy, with the exception of last week due to neutropenia. Her next scheduled chemotherapy is on January 2. She states that her chemotherapy schedule is 1 day on 8 days off. She and her husband deny any overt changes to her wound, denies pain, denies any increased drainage. 04/29/2016 -- the copayment for the skin substitute is about $300 a week and they have declined use of this material. Electronic Signature(s) Signed: 06/16/2016 4:04:49 PM By: Christin Fudge MD, FACS Entered By: Christin Fudge on 06/16/2016 16:04:48 [name] ([id number]) -------------------------------------------------------------------------------- Physical Exam Details Patient Name: [name] Date of Service: 06/16/2016 3:30 PM Medical Record Number: [id number] Patient Account Number: [id number] Date of Birth/Sex: December 24, 1945 (70 y.o. Female) Treating RN: Baruch Gouty, RN, BSN, Velva Harman Primary Care Provider: Caryl Bis, ERIC Other Clinician: Referring Provider: Caryl Bis, ERIC Treating Provider/Extender: Frann Rider in Treatment: 24 Constitutional . Pulse regular. Respirations normal and unlabored. Afebrile. . Eyes Nonicteric. Reactive to light. Ears, Nose, Mouth, and Throat Lips, teeth, and gums WNL.Marland Kitchen Moist mucosa without lesions. Neck supple and nontender. No palpable supraclavicular or cervical adenopathy. Normal sized without goiter. Respiratory WNL. No retractions.. Breath sounds WNL, No rubs, rales, rhonchi, or wheeze.. Cardiovascular Heart rhythm and rate regular, no murmur or gallop.. Pedal Pulses WNL. No clubbing, cyanosis or edema. Lymphatic No adneopathy. No adenopathy. No adenopathy. Musculoskeletal Adexa without tenderness or enlargement.. Digits and nails w/o clubbing, cyanosis, infection, petechiae, ischemia, or inflammatory  conditions.. Integumentary (Hair, Skin) No suspicious lesions. No crepitus or fluctuance. No peri-wound warmth or erythema. No masses.Marland Kitchen Psychiatric Judgement and insight Intact.. No evidence of depression, anxiety, or agitation.. Notes the wound is looking very good but the external opening has narrowed down and have removed some of the skin and subcutis tissue so that she'll be able to pack 1/4 inch iodoform gauze. Electronic Signature(s) Signed: 06/16/2016 4:05:29 PM By: Christin Fudge MD, FACS Entered By: Christin Fudge on 06/16/2016 16:05:28 [name] ([id number]) -------------------------------------------------------------------------------- Physician Orders Details Patient Name: [name]. Date  of Service: 06/16/2016 3:30 PM Medical Record Number: [id number] Patient Account Number: [id number] Date of Birth/Sex: 12/15/1945 (70 y.o. Female) Treating RN: Cornell Barman Primary Care Provider: Caryl Bis, ERIC Other Clinician: Referring Provider: Caryl Bis, ERIC Treating Provider/Extender: Frann Rider in Treatment: 24 Verbal / Phone Orders: No Diagnosis Coding ICD-10 Coding Code Description E10.621 Type 1 diabetes mellitus with foot ulcer L89.623 Pressure ulcer of left heel, stage 3 Z92.21 Personal history of antineoplastic chemotherapy Z79.52 Long term (current) use of systemic steroids Wound Cleansing Wound #2 Left Calcaneus o Clean wound with Normal Saline. Primary Wound Dressing Wound #2 Left Calcaneus o Iodoform packing Gauze - 1/4 inch Secondary Dressing Wound #2 Left Calcaneus o Dry Gauze - 2x2 o Boardered Foam Dressing Dressing Change Frequency Wound #2 Left Calcaneus o Change dressing every other day. Follow-up Appointments Wound #2 Left Calcaneus o Return Appointment in 1 week. Edema Control Wound #2 Left Calcaneus o Patient to wear own compression stockings - Patient instructed to wear 20-30 mmHg gradient o Elevate legs to  the level of the heart and pump ankles as often as possible o Support Garment 20-30 mm/Hg pressure to: [name], [name] ([id number]) Additional Orders / Instructions Wound #2 Left Calcaneus o Activity as tolerated Electronic Signature(s) Signed: 06/16/2016 4:07:08 PM By: Christin Fudge MD, FACS Signed: 06/17/2016 10:12:04 AM By: Gretta Cool, RN, BSN, Kim RN, BSN Entered By: Gretta Cool, RN, BSN, Kim on 06/16/2016 16:04:59 [name] ([id number]) -------------------------------------------------------------------------------- Problem List Details Patient Name: [name] Date of Service: 06/16/2016 3:30 PM Medical Record Number: [id number] Patient Account Number: [id number] Date of Birth/Sex: 08-Oct-1945 (70 y.o. Female) Treating RN: Baruch Gouty, RN, BSN, Velva Harman Primary Care Provider: Caryl Bis, ERIC Other Clinician: Referring Provider: Caryl Bis, ERIC Treating Provider/Extender: Frann Rider in Treatment: 24 Active Problems ICD-10 Encounter Code Description Active Date Diagnosis E10.621 Type 1 diabetes mellitus with foot ulcer 12/31/2015 Yes L89.623 Pressure ulcer of left heel, stage 3 12/31/2015 Yes Z92.21 Personal history of antineoplastic chemotherapy 12/31/2015 Yes Z79.52 Long term (current) use of systemic steroids 12/31/2015 Yes Inactive Problems Resolved Problems Electronic Signature(s) Signed: 06/16/2016 4:04:08 PM By: Christin Fudge MD, FACS Entered By: Christin Fudge on 06/16/2016 16:04:08 [name] ([id number]) -------------------------------------------------------------------------------- Progress Note Details Patient Name: [name] Date of Service: 06/16/2016 3:30 PM Medical Record Number: [id number] Patient Account Number: [id number] Date of Birth/Sex: [birth year] (70 y.o. Female) Treating RN: Baruch Gouty, RN, BSN, Velva Harman Primary Care Provider: Caryl Bis, ERIC Other Clinician: Referring Provider: Caryl Bis, ERIC Treating Provider/Extender: Frann Rider in Treatment: 24 Subjective Chief Complaint Information obtained from Patient Mrs. Kendziorski presents for evaluation of her left posterior heel pressure ulcer History of Present Illness (HPI) The following HPI elements were documented for the patient's wound: Location: right heel Quality: Patient reports experiencing a dull pain to affected area(s). Severity: Patient states wound are getting worse. Duration: Patient has had the wound for Timing: Pain in wound is Intermittent (comes and goes Context: The wound appeared gradually over time Modifying Factors: Consults to this date include: local care was done as per the medication given by the PCP which may have been medihoney Associated Signs and Symptoms: Patient reports having difficulty standing for long periods. 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 In the past she was seen for a decubitus ulcer on her right gluteal area. She is known to have  diabetes mellitus type 1 and her most recent A1c was 7.2% Past medical history significant for leukopenia, cholelithiasis, hypertension, chronic diastolic CHF, coronary artery disease,type 1 diabetes mellitus, rheumatoid arthritis, collagen vascular disease, ovarian cancer, status post CABG in 2014 and status post Port-A-Cath insertion by Dr. Leotis Pain in October 2016. She has quit smoking about 20 years ago. She was advised to use Medihoney with calcium alginate pads to be applied over the wound. 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 01/07/2016 -- x-ray of the left heel done -- IMPRESSION: No radiographic evidence of osteomyelitis. If this remains a clinical concern, recommend MR imaging. 02/05/16: returns today for f/u. denies fever, chills, body aches or malaise. no interval changes regarding health status. 02/12/16: pt is doing well. wound has improved. no s/s of infection. no systemic s/s of infection. She reports will be on vacation next week, and requests appointment in 2 weeks. 03/18/2016 - is going to be starting her chemotherapy this coming Monday and will have cycles every 3 [name], [name]. ([id number]) weeks 04-01-16 [name], accompanied by her husband, presents for evaluation of her left posterior heel stage III pressure ulcer. She states that she started chemotherapy last week. Her chemotherapy is on a cycle of one week on 3 weeks off, with her next treatment scheduled for December 11. There has been an increase in drainage since her last appointment. She denies any pain. They have been using PolyMem silver rope. 04/22/16 - [name] presents today, accompanied by her husband, for a violation of her left posterior low heel pressure ulcer. She continues to receive chemotherapy, with the exception of last week due to neutropenia. Her next scheduled chemotherapy is on January 2. She states that her chemotherapy schedule is 1 day on 8 days off. She and her husband deny any overt changes to her wound, denies pain, denies any increased drainage. 04/29/2016 -- the copayment for the skin substitute is about $300 a week and they have declined use of this material. Objective Constitutional Pulse regular. Respirations normal and unlabored. Afebrile. Vitals Time Taken: 3:44 PM, Height: 63 in, Weight: 130 lbs, BMI: 23, Temperature: 98.1 F, Pulse: 74 bpm, Respiratory Rate: 16 breaths/min, Blood Pressure: 140/55 mmHg. Eyes Nonicteric. Reactive to light. Ears, Nose,  Mouth, and Throat Lips, teeth, and gums WNL.Marland Kitchen Moist mucosa without lesions. Neck supple and nontender. No palpable supraclavicular or cervical adenopathy. Normal sized without goiter. Respiratory WNL. No retractions.. Breath sounds WNL, No rubs, rales, rhonchi, or wheeze.. Cardiovascular Heart rhythm and rate regular, no murmur or gallop.. Pedal Pulses WNL. No clubbing, cyanosis or edema. Lymphatic No adneopathy. No adenopathy. No adenopathy. Musculoskeletal Adexa without tenderness or enlargement.. Digits and nails w/o clubbing, cyanosis, infection, petechiae, ischemia, or inflammatory conditions.[name] ([id number]) Psychiatric Judgement and insight Intact.. No evidence of depression, anxiety, or agitation.. General Notes: the wound is looking very good but the external opening has narrowed down and have removed some of the skin and subcutis tissue so that she'll be able to pack 1/4 inch iodoform gauze. Integumentary (Hair, Skin) No suspicious lesions. No crepitus or fluctuance. No peri-wound warmth or erythema. No masses.. Wound #2 status is Open. Original cause of wound was  Pressure Injury. The wound is located on the Left Calcaneus. The wound measures 0.3cm length x 0.3cm width x 0.9cm depth; 0.071cm^2 area and 0.064cm^3 volume. There is no tunneling or undermining noted. There is a small amount of serous drainage noted. The wound margin is flat and intact. There is medium (34-66%) pale granulation within the wound bed. There is no necrotic tissue within the wound bed. The periwound skin appearance did not exhibit: Callus, Crepitus, Excoriation, Induration, Rash, Scarring, Dry/Scaly, Maceration, Atrophie Blanche, Cyanosis, Ecchymosis, Hemosiderin Staining, Mottled, Pallor, Rubor, Erythema. Periwound temperature was noted as No Abnormality. The periwound has tenderness on palpation. Assessment Active Problems ICD-10 E10.621 - Type 1 diabetes mellitus with foot  ulcer L89.623 - Pressure ulcer of left heel, stage 3 Z92.21 - Personal history of antineoplastic chemotherapy Z79.52 - Long term (current) use of systemic steroids Procedures Wound #2 Wound #2 is a Pressure Ulcer located on the Left Calcaneus . There was a Non-Viable Tissue Open Wound/Selective [phone]) debridement with total area of 0.2 sq cm performed by Christin Fudge, MD. with the following instrument(s): Forceps and Scissors to remove Non-Viable tissue/material including Skin. A time out was conducted at 15:55, prior to the start of the procedure. A Minimum amount of bleeding was controlled with Pressure. The procedure was tolerated well with a pain level of 1 throughout and a pain level of 0 following the procedure. Post Debridement Measurements: 0.4cm length x 0.5cm width x 0.9cm depth; 0.141cm^3 volume. Post debridement Stage noted as Category/Stage III. Character of Wound/Ulcer Post Debridement requires further debridement. Severity of Tissue Post Debridement is: Fat layer exposed. [name], [name] ([id number]) Post procedure Diagnosis Wound #2: Same as Pre-Procedure Plan Wound Cleansing: Wound #2 Left Calcaneus: Clean wound with Normal Saline. Primary Wound Dressing: Wound #2 Left Calcaneus: Iodoform packing Gauze - 1/4 inch Secondary Dressing: Wound #2 Left Calcaneus: Dry Gauze - 2x2 Boardered Foam Dressing Dressing Change Frequency: Wound #2 Left Calcaneus: Change dressing every other day. Follow-up Appointments: Wound #2 Left Calcaneus: Return Appointment in 1 week. Edema Control: Wound #2 Left Calcaneus: Patient to wear own compression stockings - Patient instructed to wear 20-30 mmHg gradient Elevate legs to the level of the heart and pump ankles as often as possible Support Garment 20-30 mm/Hg pressure to: Additional Orders / Instructions: Wound #2 Left Calcaneus: Activity as tolerated I have packed 1/4 inch iodoform gauze into the wound and applied an  appropriate dressing over this. She will change it daily or every other day She has a bit of lower extremity lymphedema and I was asked her to use compression stockings and to be careful about offloading. Nutrition, vitamins have also been discussed with her. She is still taking her chemotherapy currently. Due to high price of her copayments they want to spread out the visits and will come once in about 3 weeks MASAYE, DOWNES ([id number]) Electronic Signature(s) Signed: 06/23/2016 4:31:23 PM By: Christin Fudge MD, FACS Previous Signature: 06/16/2016 4:06:39 PM Version By: Christin Fudge MD, FACS Entered By: Christin Fudge on 06/23/2016 16:31:23 [name] ([id number]) -------------------------------------------------------------------------------- SuperBill Details Patient Name: [name] Date of Service: 06/16/2016 Medical Record Number: [id number] Patient Account Number: [id number] Date of Birth/Sex: 07-09-1945 (70 y.o. Female) Treating RN: Baruch Gouty, RN, BSN, Velva Harman Primary Care Provider: Caryl Bis, ERIC Other Clinician: Referring Provider: Caryl Bis, ERIC Treating Provider/Extender: Christin Fudge Service Line: Outpatient Weeks in Treatment: 24 Diagnosis Coding ICD-10 Codes Code Description E10.621 Type 1 diabetes mellitus with foot ulcer L89.623 Pressure ulcer of left heel, stage  3 Z92.21 Personal history of antineoplastic chemotherapy Z79.52 Long term (current) use of systemic steroids Facility Procedures CPT4 Code: [id number] Description: [id number] - DEBRIDE WOUND 1ST 20 SQ CM OR < ICD-10 Description Diagnosis E10.621 Type 1 diabetes mellitus with foot ulcer L89.623 Pressure ulcer of left heel, stage 3 Z92.21 Personal history of antineoplastic chemotherapy Z79.52 Long term  (current) use of systemic steroids Modifier: Quantity: 1 Physician Procedures CPT4 Code: [id number] Description: [id number] - WC PHYS DEBR WO ANESTH 20 SQ CM ICD-10 Description Diagnosis E10.621 Type 1 diabetes  mellitus with foot ulcer L89.623 Pressure ulcer of left heel, stage 3 Z92.21 Personal history of antineoplastic chemotherapy Z79.52 Long term  (current) use of systemic steroids Modifier: Quantity: 1 Electronic Signature(s) Signed: 06/16/2016 4:06:49 PM By: Christin Fudge MD, FACS Entered By: Christin Fudge on 06/16/2016 16:06:48

## 2016-06-27 ENCOUNTER — Other Ambulatory Visit: Payer: Self-pay | Admitting: Hematology and Oncology

## 2016-06-28 ENCOUNTER — Inpatient Hospital Stay: Payer: Medicare Other

## 2016-06-28 DIAGNOSIS — C801 Malignant (primary) neoplasm, unspecified: Secondary | ICD-10-CM

## 2016-06-28 DIAGNOSIS — C786 Secondary malignant neoplasm of retroperitoneum and peritoneum: Secondary | ICD-10-CM

## 2016-06-28 DIAGNOSIS — C569 Malignant neoplasm of unspecified ovary: Secondary | ICD-10-CM

## 2016-06-28 LAB — BASIC METABOLIC PANEL
Anion gap: 7 (ref 5–15)
BUN: 18 mg/dL (ref 6–20)
CO2: 27 mmol/L (ref 22–32)
Calcium: 8.8 mg/dL — ABNORMAL LOW (ref 8.9–10.3)
Chloride: 100 mmol/L — ABNORMAL LOW (ref 101–111)
Creatinine, Ser: 0.66 mg/dL (ref 0.44–1.00)
GFR calc Af Amer: >60 mL/min (ref >60)
GFR calc non Af Amer: >60 mL/min (ref >60)
Glucose, Bld: 245 mg/dL — ABNORMAL HIGH (ref 65–99)
Potassium: 4.3 mmol/L (ref 3.5–5.1)
Sodium: 134 mmol/L — ABNORMAL LOW (ref 135–145)

## 2016-06-28 LAB — MAGNESIUM: Magnesium: 1.3 mg/dL — ABNORMAL LOW (ref 1.7–2.4)

## 2016-06-28 MED ORDER — MAGNESIUM SULFATE 4 GM/100ML IV SOLN
4.0000 g | Freq: Once | INTRAVENOUS | Status: AC
  Administered 2016-06-28: 4 g via INTRAVENOUS
  Filled 2016-06-28: qty 100

## 2016-06-28 MED ORDER — SODIUM CHLORIDE 0.9 % IV SOLN
Freq: Once | INTRAVENOUS | Status: AC
  Administered 2016-06-28: 09:00:00 via INTRAVENOUS
  Filled 2016-06-28: qty 1000

## 2016-06-28 MED ORDER — HEPARIN SOD (PORK) LOCK FLUSH 100 UNIT/ML IV SOLN
500.0000 [IU] | Freq: Once | INTRAVENOUS | Status: AC | PRN
  Administered 2016-06-28: 500 [IU]
  Filled 2016-06-28: qty 5

## 2016-06-28 NOTE — Telephone Encounter (Signed)
Comprehensive metabolic panel  Order: [id number]  Status:  Final result Visible to patient:  Yes (MyChart) Next appt:  07/06/2016 at 11:30 AM in Urology Hollice Espy, MD) Dx:  Malignant neoplasm of ovary, unspecif...    Ref Range & Units 7d ago (06/21/16) 2wk ago (06/14/16) 2wk ago (06/10/16)   Potassium 3.5 - 5.1 mmol/L 4.4  4.2  4.0

## 2016-07-06 ENCOUNTER — Ambulatory Visit: Payer: Medicare Other | Admitting: Urology

## 2016-07-06 ENCOUNTER — Encounter: Payer: Self-pay | Admitting: Urology

## 2016-07-06 VITALS — BP 115/67 | HR 80 | Ht 63.0 in | Wt 140.0 lb

## 2016-07-06 DIAGNOSIS — N1330 Unspecified hydronephrosis: Secondary | ICD-10-CM

## 2016-07-06 DIAGNOSIS — C569 Malignant neoplasm of unspecified ovary: Secondary | ICD-10-CM | POA: Diagnosis not present

## 2016-07-06 NOTE — Progress Notes (Signed)
07/06/2016 11:43 AM   [name] 12/17/1945 [id number]  Referring provider: Leone Haven, MD [place],  [address]  Chief Complaint  Patient presents with  . Hydronephrosis    New Patient    HPI:  71 year old female with a history of stage IV ovarian cancer who presents today for further evaluation of the bilateral hydronephrosis.   She was initially diagnosed on 01/2015 at which time CT abdomen pelvis revealed bilateral mass in the adnexal regions with a large amount of soft tissue throughout the peritoneal cavity consistent with carcinomatosis.  She received 4 cycles of neoadjuvant carboplatin and Taxol and ultimately underwent total abdominal hysterectomy, bilateral salpingo-oophorectomy omentectomy and debulking, rectosigmoid resection and creation of an end colostomy, cholecystectomy on 06/15/2015.  More recently, she was noted to progress following 4 cycles of carboplatin and gemcitabine completed in 1/21018.    Her most recent CT abdomen and pelvis with contrast and 06/10/2016 shows question of peritoneal carcinomatosis with worsening bilateral obstructive uropathy possibly secondary to involvement of the ureters.  Her creatinine has remained stable around 0.66 .    She is currently undergoing cycle  #1 of doxirubicin (Doxil).  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  She denies any issues voiding. No UTIs or gross hematuria. She is able to empty her bladder without difficulty.   PMH: Past Medical History:  Diagnosis Date  . CAD (coronary artery disease)    a. 09/2012 Cath: LM nl, LAD 95p, 72m LCX 992mOM2 50, RCA 100.  . Marland Kitchenholelithiasis   . Chronic diastolic CHF (congestive heart failure) ([facility]  . [place] vascular disease ([facility]  . Esophageal stricture   . Exertional shortness of breath   .  GERD (gastroesophageal reflux disease)   . H/O hiatal hernia   . Herniated disc   . History of pancytopenia   . Hyperlipidemia   . Hypertension   . Hypokalemia   . Leukopenia 2012   s/p bone marrow biopsy, Dr. PaMa Hillock. NSTEMI (non-ST elevated myocardial infarction) (HCPoca5/2014   "mild" (10/18/2012)  . Ovarian cancer ([facility]2016   chemo  . Pneumonia 2013; 08/2012   "one lung; double" (10/18/2012)  . Rheumatoid arthritis(714.0)   . Type I diabetes mellitus ([facility] [place]   "dx'd in 1957" (10/18/2012)    Surgical History: Past Surgical History:  Procedure Laterality Date  . ABDOMINAL HYSTERECTOMY  06/15/2015   Dx L/S, EXLAP TAH BSO omentectomy RSRx colostomy diaphragm resection stripping  . CARDIAC CATHETERIZATION  10/18/2012   "first one was today" (10/18/2012)  . CATARACT EXTRACTION W/ INTRAOCULAR LENS IMPLANT Right 2010  . CHOLECYSTECTOMY  06/15/2015   combined case with ovarian cancer debulking  . COLON SURGERY    . CORONARY ARTERY BYPASS GRAFT N/A 10/19/2012   Procedure: CORONARY ARTERY BYPASS GRAFTING (CABG);  Surgeon: StMelrose NakayamaMD;  [facility] Service: Open Heart Surgery;  Laterality: N/A;  . ESOPHAGEAL DILATION     "3 or 4 times" (10/19/2011)  . ESOPHAGOGASTRODUODENOSCOPY  2012   Dr. IfMinna Merritts. OSTOMY    . OVARY SURGERY     removal  . PERIPHERAL VASCULAR CATHETERIZATION N/A 03/02/2015   Procedure: PoGlori Luisath Insertion;  Surgeon: JaAlgernon HuxleyMD;  [facility];  Service: Cardiovascular;  Laterality: N/A;  . TUBAL LIGATION  1970    Home Medications:  Allergies as of 07/06/2016      Reactions   Codeine Nausea And Vomiting   Latex Rash      Medication List       Accurate as of 07/06/16 11:43 AM. Always use your most recent med list.          Calcium-Vitamin D 600-200 MG-UNIT tablet Take 2 tablets by mouth daily.   enoxaparin 60 MG/0.6ML injection Commonly known as:  LOVENOX Inject 0.6 mLs (60 mg total) into the skin every 12 (twelve)  hours.   folic acid 1 MG tablet Commonly known as:  FOLVITE TAKE 1 TABLET BY MOUTH ONCE A DAY   insulin lispro 100 UNIT/ML injection Commonly known as:  HUMALOG Inject 0.06 mLs (6 Units total) into the skin daily.   lidocaine-prilocaine cream Commonly known as:  EMLA APPLY 1 APPLICATION TOPICALLY AS NEEDED 1 HOUR PRIOR TO TREATMENT. COVER WITH PRESS N SEAL UNTIL TREATMENT TIME AS DIRECTED   loratadine 10 MG tablet Commonly known as:  CLARITIN Take 1 tablet by mouth daily as needed.   losartan 100 MG tablet Commonly known as:  COZAAR Take 1 tablet (100 mg total) by mouth daily.   magnesium oxide 400 (241.3 Mg) MG tablet Commonly known as:  MAG-OX TAKE 1 TABLET BY MOUTH ONCE A DAY   metoprolol tartrate 25 MG tablet Commonly known as:  LOPRESSOR Take 1 tablet (25 mg total) by mouth 2 (two) times daily.   ondansetron 8 MG tablet Commonly known as:  ZOFRAN TAKE 1 TABLET BY MOUTH EVERY 8 HOURS AS NEEDED FOR NAUSEA OR VOMITING   ONE TOUCH ULTRA TEST test strip Generic drug:  glucose blood   pantoprazole 40 MG tablet Commonly known as:  PROTONIX TAKE 1 TABLET BY MOUTH TWICE A DAY   potassium chloride SA 20 MEQ tablet Commonly known as:  K-DUR,KLOR-CON Take 1 tablet (20 mEq total) by mouth daily.   predniSONE 5 MG tablet Commonly known as:  DELTASONE Take 5 mg by mouth daily with breakfast.   ranitidine 150 MG tablet Commonly known as:  ZANTAC Take 1 tablet (150 mg total) by mouth 2 (two) times daily.   vitamin C 500 MG tablet Commonly known as:  ASCORBIC ACID Take 500 mg by mouth daily.   zinc gluconate 50 MG tablet Take 50 mg by mouth daily.       Allergies:  Allergies  Allergen Reactions  . Codeine Nausea And Vomiting  . Latex Rash    Family History: Family History  Problem Relation Age of Onset  . Diabetes Mother   . Arthritis Mother   . Diabetes Father   . Arthritis Father   . Bone cancer Sister     Social History:  reports that she quit  smoking about 22 years ago. Her smoking use included Cigarettes. She has a 30.00 pack-year smoking history. She has never used smokeless tobacco. She reports that she does not drink alcohol or use drugs.  ROS: UROLOGY Frequent Urination?: No Hard to postpone urination?: No Burning/pain with urination?: No Get up at night to urinate?: Yes Leakage of urine?: No Urine stream starts and stops?: Yes Trouble starting stream?: No Do you have to strain to urinate?: Yes Blood in urine?: No Urinary tract infection?: No Sexually transmitted disease?: No Injury to kidneys or bladder?: No Painful intercourse?: No Weak stream?: Yes Currently pregnant?: No Vaginal bleeding?: No Last menstrual period?: n  Gastrointestinal Nausea?: Yes Vomiting?: Yes Indigestion/heartburn?: Yes Diarrhea?: Yes Constipation?: Yes  Constitutional Fever:  No Night sweats?: No Weight loss?: No Fatigue?: Yes  Skin Skin rash/lesions?: No Itching?: Yes  Eyes Blurred vision?: Yes Double vision?: Yes  Ears/Nose/Throat Sore throat?: No Sinus problems?: Yes  Hematologic/Lymphatic Swollen glands?: No Easy bruising?: Yes  Cardiovascular Leg swelling?: No Chest pain?: No  Respiratory Cough?: No Shortness of breath?: Yes  Endocrine Excessive thirst?: No  Musculoskeletal Back pain?: Yes Joint pain?: Yes  Neurological Headaches?: Yes Dizziness?: Yes  Psychologic Depression?: No Anxiety?: No  Physical Exam: BP 115/67   Pulse 80   Ht _0  (1.6 m)   Wt 140 lb (63.5 kg)   BMI 24.80 kg/m   Constitutional:  Alert and oriented, No acute distress. Accompanied today by her husband. HEENT: [place] AT, moist mucus membranes.  Trachea midline, no masses. Cardiovascular: No clubbing, cyanosis, or edema. Respiratory: Normal respiratory effort, no increased work of breathing. GI: Abdomen is soft, nontender, nondistended, no abdominal masses.  Colostomy bag present.   GU: No CVA tenderness.  Skin: No  rashes, bruises or suspicious lesions.. Neurologic: Grossly intact, no focal deficits, moving all 4 extremities. Psychiatric: Normal mood and affect.  Ambulating with cane.   Laboratory Data: Lab Results  Component Value Date   WBC 2.4 (L) 06/21/2016   HGB 9.4 (L) 06/21/2016   HCT 27.0 (L) 06/21/2016   MCV 95.0 06/21/2016   PLT 205 06/21/2016    Lab Results  Component Value Date   CREATININE 0.66 06/28/2016     Lab Results  Component Value Date   HGBA1C 6.5 (H) 05/28/2016    Urinalysis Component     Latest Ref Rng & Units 05/26/2016  Color, Urine     YELLOW STRAW (A)  Appearance     CLEAR CLEAR (A)  Glucose     NEGATIVE mg/dL 50 (A)  Bilirubin Urine     NEGATIVE NEGATIVE  Ketones, ur     NEGATIVE mg/dL 5 (A)  Specific Gravity, Urine     1.005 - 1.030 1.011  Hgb urine dipstick     NEGATIVE NEGATIVE  pH     5.0 - 8.0 6.0  Protein     NEGATIVE mg/dL NEGATIVE  Nitrite     NEGATIVE NEGATIVE  Leukocytes, UA     NEGATIVE NEGATIVE  RBC / HPF     0 - 5 RBC/hpf 0-5  WBC, UA     0 - 5 WBC/hpf 0-5  Bacteria, UA     NONE SEEN NONE SEEN  Squamous Epithelial / LPF     NONE SEEN 0-5 (A)  Hyaline Casts, UA      PRESENT    Pertinent Imaging: CLINICAL DATA:  Stage IV ovarian cancer presenting for restaging after recent chemotherapy. TAHBSO with omentectomy, colostomy and diaphragmatic stripping 06/15/2015.  EXAM: CT ABDOMEN AND PELVIS WITH CONTRAST  TECHNIQUE: Multidetector CT imaging of the abdomen and pelvis was performed using the standard protocol following bolus administration of intravenous contrast.  CONTRAST:  192m ISOVUE-300 IOPAMIDOL (ISOVUE-300) INJECTION 61%  COMPARISON:  03/08/2016 CT abdomen/pelvis.  FINDINGS: Lower chest: Stable 4 mm subpleural posterior right lower lobe solid pulmonary nodule (series 4/ image 1). Pleural 4 mm nodule in the medial basilar right pleural space (series 4/image 16) is stable. Small left dependent basilar  left pleural effusion, stable. Stable mild cardiomegaly and three-vessel coronary atherosclerosis. The tip of a superior approach central venous catheter is seen at the cavoatrial junction. Visualized lower median sternotomy wires appear intact.  Hepatobiliary: Several perihepatic space tumor implants are noted, several  of which have increased in size, and a few of which are stable in size. For example, a right subdiaphragmatic 2.5 x 1.7 cm implant (series 2/image 7), previously 2.4 x 1.3 cm, mildly increased. The right perihepatic space 1.3 x 1.1 cm implant (series 2/ image 21), previously 1.2 x 0.8 cm, mildly increased. A 1.8 x 1.2 cm superior right hepatorenal space implant (series 2/image 18), previously 1.0 x 0.8 cm, increased. A 1.1 x 1.0 cm inferior right hepatorenal space implant (series 2/ image 34), previously 1.0 x 1.0 cm, stable. An inferior right perihepatic space 4.0 x 2.8 cm mixed cystic and solid implant (series 2/ image 41), previously 2.2 x 1.5 cm, increased. Inferior right liver lobe 1.2 x 0.9 cm capsular implant (series 2/ image 35), previously 0.8 x 0.7 cm, increased. Implant along the intersegmental fissure in the left liver lobe measures 2.6 x 2.3 cm (series 2/ image 25), previously 2.6 x 2.5 cm, stable. Cholecystectomy. Stable mild diffuse intrahepatic biliary ductal dilatation and common bile duct diameter 7 mm, within normal post cholecystectomy limits. Stable 1.4 cm ampullary mass projecting into the duodenal lumen (series 2/ image 36).  Pancreas: Normal, with no mass or duct dilation.  Spleen: Normal size. No mass.  Adrenals/Urinary Tract: Normal adrenals. Mild right hydroureteronephrosis to the level of the right pelvic ureter at the site of right lateral pelvic wall thickening (series 2/ image 66), worsened. New mild left hydroureteronephrosis to the level of the left pelvic ureter at the site of a new mixed cystic and solid 2.3 x 1.7 cm left  pelvic sidewall implant (series 2/ image 68). No renal masses.  Stomach/Bowel: Small hiatal hernia. Otherwise grossly normal stomach. Normal caliber small bowel with no small bowel wall thickening. Stable postsurgical changes from partial distal colectomy with ventral left abdominal wall end colostomy. Collapsed blind-ending rectal remnant. No large bowel wall thickening or pericolonic fat stranding.  Vascular/Lymphatic: Atherosclerotic nonaneurysmal abdominal aorta. Patent portal, splenic, hepatic and renal veins. No pathologically enlarged lymph nodes in the abdomen or pelvis.  Reproductive: Status post hysterectomy and bilateral oophorectomy.  Other: No pneumoperitoneum. Small volume perihepatic ascites is decreased. No free fluid in the pelvis. Stable 3.8 x 2.5 cm right lower quadrant peritoneal implant (series 2/ image 52), previously 3.8 x 2.6 cm. Several peritoneal implants throughout the pelvis have increased in size, for example a 2.6 x 2.2 cm right upper pelvic implant (series 2/ image 61), increased from 1.8 x 1.4 cm, and a solid 2.2 x 1.7 cm deep pelvic implant (series 2/ image 69), increased from 2.0 x 1.5 cm. Large mixed cystic and solid 5.9 x 3.3 cm left lower quadrant implant (series 2/image 50), significantly increased from 2.6 x 1.4 cm.  Musculoskeletal: No aggressive appearing focal osseous lesions. Marked thoracolumbar spondylosis. Stable chronic compression deformity of the T12 vertebral body.  IMPRESSION: 1. Progression of peritoneal carcinomatosis predominantly in the perihepatic space, bilateral lower quadrants and pelvis. 2. Worsening bilateral obstructive uropathy due to malignant involvement of the pelvic ureters. 3. Stable small pleural/subpleural nodules at the right lung base. Stable small left pleural effusion. 4. Decreased small volume perihepatic ascites. 5. Additional findings include aortic atherosclerosis, coronary atherosclerosis,  stable nonspecific ampullary mass and small hiatal hernia.   Electronically Signed   By: Ilona Sorrel M.D.   On: 06/10/2016 10:37  CT scan personally reviewed today and with the patient  Assessment & Plan:    1. Bilateral hydronephrosis CT images reviewed today with the patient, bilateral hydronephrosis secondary  to extrinsic compression from ovarian cancer mass at the level of the pelvis Hydronephrosis is uniform bilaterally, moderate but overall renal function is preserved We discussed that if her current regimen fails and her tumor progress, she will likely develop renal failure  --> options in this situation were discuss including transition to palliate care, bilateral nephrostomy tube placement and bilateral ureteral stent placement Risks and benefits of each were discussed She is not at all interested in bilateral nephrostomy tubes but may consider bilateral ureteral stents She is aware that stents may fail and require nephrostomy tubes.  Side effects and exchange schedule were reviewed.   Discussed with Dr. Mike Gip to refer back when renal function starts to decline, GFR < 60. Will arrange for stent placement at that time.  - Urinalysis, Complete - CULTURE, URINE COMPREHENSIVE  2. Malignant neoplasm of ovary, unspecified laterality ([place]) As above   Hollice Espy, MD  [facility] [address], [place], [place] [phone]

## 2016-07-07 ENCOUNTER — Inpatient Hospital Stay (HOSPITAL_BASED_OUTPATIENT_CLINIC_OR_DEPARTMENT_OTHER): Payer: Medicare Other

## 2016-07-07 ENCOUNTER — Inpatient Hospital Stay: Payer: Medicare Other | Attending: Hematology and Oncology | Admitting: Hematology and Oncology

## 2016-07-07 ENCOUNTER — Encounter: Payer: Self-pay | Admitting: Hematology and Oncology

## 2016-07-07 ENCOUNTER — Inpatient Hospital Stay: Payer: Medicare Other

## 2016-07-07 VITALS — BP 112/65 | HR 76 | Temp 98.1°F | Resp 18 | Wt 140.9 lb

## 2016-07-07 DIAGNOSIS — C569 Malignant neoplasm of unspecified ovary: Secondary | ICD-10-CM

## 2016-07-07 DIAGNOSIS — Z7689 Persons encountering health services in other specified circumstances: Secondary | ICD-10-CM | POA: Insufficient documentation

## 2016-07-07 DIAGNOSIS — C786 Secondary malignant neoplasm of retroperitoneum and peritoneum: Secondary | ICD-10-CM | POA: Insufficient documentation

## 2016-07-07 DIAGNOSIS — Z5111 Encounter for antineoplastic chemotherapy: Secondary | ICD-10-CM | POA: Insufficient documentation

## 2016-07-07 DIAGNOSIS — K219 Gastro-esophageal reflux disease without esophagitis: Secondary | ICD-10-CM | POA: Diagnosis not present

## 2016-07-07 DIAGNOSIS — I998 Other disorder of circulatory system: Secondary | ICD-10-CM

## 2016-07-07 DIAGNOSIS — Z808 Family history of malignant neoplasm of other organs or systems: Secondary | ICD-10-CM | POA: Insufficient documentation

## 2016-07-07 DIAGNOSIS — I10 Essential (primary) hypertension: Secondary | ICD-10-CM | POA: Diagnosis not present

## 2016-07-07 DIAGNOSIS — I2510 Atherosclerotic heart disease of native coronary artery without angina pectoris: Secondary | ICD-10-CM | POA: Insufficient documentation

## 2016-07-07 DIAGNOSIS — I252 Old myocardial infarction: Secondary | ICD-10-CM | POA: Insufficient documentation

## 2016-07-07 DIAGNOSIS — Z87891 Personal history of nicotine dependence: Secondary | ICD-10-CM | POA: Diagnosis not present

## 2016-07-07 DIAGNOSIS — Z79899 Other long term (current) drug therapy: Secondary | ICD-10-CM | POA: Diagnosis not present

## 2016-07-07 DIAGNOSIS — Z8673 Personal history of transient ischemic attack (TIA), and cerebral infarction without residual deficits: Secondary | ICD-10-CM | POA: Diagnosis not present

## 2016-07-07 DIAGNOSIS — M069 Rheumatoid arthritis, unspecified: Secondary | ICD-10-CM | POA: Insufficient documentation

## 2016-07-07 DIAGNOSIS — I82C11 Acute embolism and thrombosis of right internal jugular vein: Secondary | ICD-10-CM

## 2016-07-07 DIAGNOSIS — Z9641 Presence of insulin pump (external) (internal): Secondary | ICD-10-CM | POA: Diagnosis not present

## 2016-07-07 DIAGNOSIS — E069 Thyroiditis, unspecified: Secondary | ICD-10-CM | POA: Insufficient documentation

## 2016-07-07 DIAGNOSIS — I5032 Chronic diastolic (congestive) heart failure: Secondary | ICD-10-CM

## 2016-07-07 DIAGNOSIS — E119 Type 2 diabetes mellitus without complications: Secondary | ICD-10-CM

## 2016-07-07 DIAGNOSIS — Z8701 Personal history of pneumonia (recurrent): Secondary | ICD-10-CM | POA: Insufficient documentation

## 2016-07-07 DIAGNOSIS — R200 Anesthesia of skin: Secondary | ICD-10-CM

## 2016-07-07 DIAGNOSIS — T451X5S Adverse effect of antineoplastic and immunosuppressive drugs, sequela: Secondary | ICD-10-CM | POA: Diagnosis not present

## 2016-07-07 DIAGNOSIS — Z794 Long term (current) use of insulin: Secondary | ICD-10-CM

## 2016-07-07 DIAGNOSIS — E108 Type 1 diabetes mellitus with unspecified complications: Secondary | ICD-10-CM | POA: Diagnosis not present

## 2016-07-07 DIAGNOSIS — R188 Other ascites: Secondary | ICD-10-CM

## 2016-07-07 DIAGNOSIS — R5383 Other fatigue: Secondary | ICD-10-CM

## 2016-07-07 DIAGNOSIS — R110 Nausea: Secondary | ICD-10-CM | POA: Diagnosis not present

## 2016-07-07 DIAGNOSIS — K449 Diaphragmatic hernia without obstruction or gangrene: Secondary | ICD-10-CM | POA: Diagnosis not present

## 2016-07-07 DIAGNOSIS — E785 Hyperlipidemia, unspecified: Secondary | ICD-10-CM

## 2016-07-07 DIAGNOSIS — R232 Flushing: Secondary | ICD-10-CM | POA: Diagnosis not present

## 2016-07-07 DIAGNOSIS — D638 Anemia in other chronic diseases classified elsewhere: Secondary | ICD-10-CM | POA: Diagnosis not present

## 2016-07-07 DIAGNOSIS — C801 Malignant (primary) neoplasm, unspecified: Secondary | ICD-10-CM

## 2016-07-07 DIAGNOSIS — K222 Esophageal obstruction: Secondary | ICD-10-CM | POA: Insufficient documentation

## 2016-07-07 DIAGNOSIS — E876 Hypokalemia: Secondary | ICD-10-CM

## 2016-07-07 DIAGNOSIS — Z87442 Personal history of urinary calculi: Secondary | ICD-10-CM

## 2016-07-07 DIAGNOSIS — N289 Disorder of kidney and ureter, unspecified: Secondary | ICD-10-CM | POA: Diagnosis not present

## 2016-07-07 LAB — MAGNESIUM: Magnesium: 1.5 mg/dL — ABNORMAL LOW (ref 1.7–2.4)

## 2016-07-07 LAB — CBC WITH DIFFERENTIAL/PLATELET
Basophils Absolute: 0 10*3/uL (ref 0–0.1)
Basophils Relative: 0 %
Eosinophils Absolute: 0.1 10*3/uL (ref 0–0.7)
Eosinophils Relative: 2 %
HCT: 30.6 % — ABNORMAL LOW (ref 35.0–47.0)
Hemoglobin: 10.6 g/dL — ABNORMAL LOW (ref 12.0–16.0)
Lymphocytes Relative: 23 %
Lymphs Abs: 0.7 10*3/uL — ABNORMAL LOW (ref 1.0–3.6)
MCH: 32.9 pg (ref 26.0–34.0)
MCHC: 34.6 g/dL (ref 32.0–36.0)
MCV: 95.1 fL (ref 80.0–100.0)
Monocytes Absolute: 0.4 10*3/uL (ref 0.2–0.9)
Monocytes Relative: 13 %
Neutro Abs: 2 10*3/uL (ref 1.4–6.5)
Neutrophils Relative %: 62 %
Platelets: 224 10*3/uL (ref 150–440)
RBC: 3.22 MIL/uL — ABNORMAL LOW (ref 3.80–5.20)
RDW: 16.8 % — ABNORMAL HIGH (ref 11.5–14.5)
WBC: 3.2 10*3/uL — ABNORMAL LOW (ref 3.6–11.0)

## 2016-07-07 LAB — BASIC METABOLIC PANEL
Anion gap: 9 (ref 5–15)
BUN: 18 mg/dL (ref 6–20)
CO2: 26 mmol/L (ref 22–32)
Calcium: 9.3 mg/dL (ref 8.9–10.3)
Chloride: 99 mmol/L — ABNORMAL LOW (ref 101–111)
Creatinine, Ser: 0.7 mg/dL (ref 0.44–1.00)
GFR calc Af Amer: >60 mL/min (ref >60)
GFR calc non Af Amer: >60 mL/min (ref >60)
Glucose, Bld: 219 mg/dL — ABNORMAL HIGH (ref 65–99)
Potassium: 4.1 mmol/L (ref 3.5–5.1)
Sodium: 134 mmol/L — ABNORMAL LOW (ref 135–145)

## 2016-07-07 MED ORDER — SODIUM CHLORIDE 0.9% FLUSH
3.0000 mL | Freq: Once | INTRAVENOUS | Status: DC | PRN
  Filled 2016-07-07: qty 3

## 2016-07-07 MED ORDER — POTASSIUM CHLORIDE CRYS ER 10 MEQ PO TBCR: 20.0000 meq | EXTENDED_RELEASE_TABLET | Freq: Every day | ORAL | Status: DC

## 2016-07-07 MED ORDER — POTASSIUM CHLORIDE CRYS ER 20 MEQ PO TBCR: 20.0000 meq | EXTENDED_RELEASE_TABLET | Freq: Every day | ORAL | 0 refills | Status: DC

## 2016-07-07 MED ORDER — SODIUM CHLORIDE 0.9 % IV SOLN
Freq: Once | INTRAVENOUS | Status: AC
  Administered 2016-07-07: 10:00:00 via INTRAVENOUS
  Filled 2016-07-07: qty 1000

## 2016-07-07 MED ORDER — MAGNESIUM SULFATE 2 GM/50ML IV SOLN
2.0000 g | Freq: Once | INTRAVENOUS | Status: AC
  Administered 2016-07-07: 2 g via INTRAVENOUS
  Filled 2016-07-07: qty 50

## 2016-07-07 MED ORDER — HEPARIN SOD (PORK) LOCK FLUSH 100 UNIT/ML IV SOLN
500.0000 [IU] | Freq: Once | INTRAVENOUS | Status: DC | PRN
  Filled 2016-07-07: qty 5

## 2016-07-07 NOTE — Progress Notes (Signed)
[facility] day:  07/07/2016   Chief Complaint: [name] is a 71 y.o. female with stage IV ovarian cancer who is seen for assessment on day 17 of cycle #1 Doxil.  HPI:  The patient was last seen in the medical oncology clinic on 06/21/2016.  At that time, carboplatin and gemcitabine were discontinued due to imaging studies revealing progression of disease. Single agent Doxil with Neulasta support was discussed. The addition of Avastin was not felt to be an option secondary to her recent CVA on Eliquis.  Echo on 05/27/2016 revealed an EF of 60-65%.  She received Doxil with Neulasta support.  There were no available local clinical trials.  Dr. Theora Gianotti was contacted.  She agreed with the plan.  The patient was also scheduled to see Dr. Erlene Quan on 06/21/2016 regarding her obstructive nephropathy.  She tolerated her chemotherapy well.  She noted a little nausea the first week.  She did not require antiemetics.  Symptomatically, she reports increased fatigue.  She has hot flashes up to 3 times a day.  She has shortness of breath with exertion.  She has chronic tingling in bilateral fingers and toes, occasionally affecting her grip.     Past Medical History:  Diagnosis Date  . CAD (coronary artery disease)    a. 09/2012 Cath: LM nl, LAD 95p, 63m LCX 929mOM2 50, RCA 100.  . Marland Kitchenholelithiasis   . Chronic diastolic CHF (congestive heart failure) ([facility]  . Collagen vascular disease ([facility]  . Esophageal stricture   . Exertional shortness of breath   . GERD (gastroesophageal reflux disease)   . H/O hiatal hernia   . Herniated disc   . History of pancytopenia   . Hyperlipidemia   . Hypertension   . Hypokalemia   . Leukopenia 2012   s/p bone marrow biopsy, Dr. PaMa Hillock. NSTEMI (non-ST elevated myocardial infarction) ([facility]   "mild" (10/18/2012)  . Ovarian cancer ([facility]   chemo  . Pneumonia 2013; 08/2012   "one lung; double" (10/18/2012)  .  Rheumatoid arthritis(714.0)   . Type I diabetes mellitus ([facility]   "dx'd in 1957" (10/18/2012)    Past Surgical History:  Procedure Laterality Date  . ABDOMINAL HYSTERECTOMY  06/15/2015   Dx L/S, EXLAP TAH BSO omentectomy RSRx colostomy diaphragm resection stripping  . CARDIAC CATHETERIZATION  10/18/2012   "first one was today" (10/18/2012)  . CATARACT EXTRACTION W/ INTRAOCULAR LENS IMPLANT Right 2010  . CHOLECYSTECTOMY  06/15/2015   combined case with ovarian cancer debulking  . COLON SURGERY    . CORONARY ARTERY BYPASS GRAFT N/A 10/19/2012   Procedure: CORONARY ARTERY BYPASS GRAFTING (CABG);  Surgeon: StMelrose NakayamaMD;  [place] [facility] [place] Service: Open Heart Surgery;  Laterality: N/A;  . ESOPHAGEAL DILATION     "3 or 4 times" (10/19/2011)  . ESOPHAGOGASTRODUODENOSCOPY  2012   Dr. IfMinna Merritts. OSTOMY    . OVARY SURGERY     removal  . PERIPHERAL VASCULAR CATHETERIZATION N/A 03/02/2015   Procedure: PoGlori Luisath Insertion;  Surgeon: JaAlgernon HuxleyMD;  [facility];  Service: Cardiovascular;  Laterality: N/A;  . TUBAL LIGATION  1970    Family History  Problem Relation Age of Onset  . Diabetes Mother   . Arthritis Mother   . Diabetes Father   . Arthritis Father   . Bone cancer Sister     Social History:  reports that  she quit smoking about 22 years ago. Her smoking use included Cigarettes. She has a 30.00 pack-year smoking history. She has never used smokeless tobacco. She reports that she does not drink alcohol or use drugs.  She is accompanied by her husband today.  Allergies:  Allergies  Allergen Reactions  . Codeine Nausea And Vomiting  . Latex Rash    Current Medications: Current Outpatient Prescriptions  Medication Sig Dispense Refill  . Calcium-Vitamin D 600-200 MG-UNIT tablet Take 2 tablets by mouth daily.     Marland Kitchen enoxaparin (LOVENOX) 60 MG/0.6ML injection Inject 0.6 mLs (60 mg total) into the skin every 12 (twelve) hours. 60 Syringe 1  . folic acid  (FOLVITE) 1 MG tablet TAKE 1 TABLET BY MOUTH ONCE A DAY 90 tablet 3  . insulin lispro (HUMALOG) 100 UNIT/ML injection Inject 0.06 mLs (6 Units total) into the skin daily. (Patient taking differently: Inject 0-85 Units into the skin daily. Via insulin pump) 30 mL 3  . lidocaine-prilocaine (EMLA) cream APPLY 1 APPLICATION TOPICALLY AS NEEDED 1 HOUR PRIOR TO TREATMENT. COVER WITH PRESS N SEAL UNTIL TREATMENT TIME AS DIRECTED 30 g 1  . loratadine (CLARITIN) 10 MG tablet Take 1 tablet by mouth daily as needed.    Marland Kitchen losartan (COZAAR) 100 MG tablet Take 1 tablet (100 mg total) by mouth daily. 90 tablet 1  . magnesium oxide (MAG-OX) 400 (241.3 Mg) MG tablet TAKE 1 TABLET BY MOUTH ONCE A DAY 30 tablet 0  . metoprolol tartrate (LOPRESSOR) 25 MG tablet Take 1 tablet (25 mg total) by mouth 2 (two) times daily. 180 tablet 3  . ondansetron (ZOFRAN) 8 MG tablet TAKE 1 TABLET BY MOUTH EVERY 8 HOURS AS NEEDED FOR NAUSEA OR VOMITING 30 tablet 1  . ONE TOUCH ULTRA TEST test strip     . pantoprazole (PROTONIX) 40 MG tablet TAKE 1 TABLET BY MOUTH TWICE A DAY 180 tablet 1  . potassium chloride SA (K-DUR,KLOR-CON) 20 MEQ tablet Take 1 tablet (20 mEq total) by mouth daily. (Patient taking differently: Take 40 mEq by mouth daily. ) 20 tablet 0  . predniSONE (DELTASONE) 5 MG tablet Take 5 mg by mouth daily with breakfast.    . ranitidine (ZANTAC) 150 MG tablet Take 1 tablet (150 mg total) by mouth 2 (two) times daily. 180 tablet 1  . vitamin C (ASCORBIC ACID) 500 MG tablet Take 500 mg by mouth daily.    Marland Kitchen zinc gluconate 50 MG tablet Take 50 mg by mouth daily.     Current Facility-Administered Medications  Medication Dose Route Frequency Provider Last Rate Last Dose  . potassium chloride (K-DUR,KLOR-CON) CR tablet 20 mEq  20 mEq Oral Daily Lucendia Herrlich, NP      . Tbo-Filgrastim (GRANIX) injection 300 mcg  300 mcg Subcutaneous Once Lequita Asal, MD       Facility-Administered Medications Ordered in Other Visits   Medication Dose Route Frequency Provider Last Rate Last Dose  . 0.9 %  sodium chloride infusion   Intravenous Once Lequita Asal, MD   Stopped at 07/07/16 1142  . heparin lock flush 100 unit/mL  500 Units Intracatheter Once PRN Lequita Asal, MD      . magnesium sulfate IVPB 4 g 100 mL  4 g Intravenous Once Lequita Asal, MD      . sodium chloride 0.9 % injection 10 mL  10 mL Intracatheter PRN Lequita Asal, MD      . sodium chloride 0.9 %  injection 10 mL  10 mL Intravenous PRN Lequita Asal, MD   10 mL at 04/13/15 0852  . sodium chloride flush (NS) 0.9 % injection 3 mL  3 mL Intracatheter Once PRN Lequita Asal, MD        Review of Systems:  GENERAL:  Feels well overall.  Increased fatigue.  No fever, chills or sweats.  Weight unchanged. PERFORMANCE STATUS (ECOG):  1 HEENT:  No visual changes, sore throat, mouth sores or tenderness. Lungs:  Dyspnea on exertion, relieved with rest. No cough.  No hemoptysis. Cardiac:  No chest pain, palpitations, orthopnea, or PND. GI:  No abdominal pain or distention.  No nausea, vomiting, diarrhea, melena or hematochezia. GU:  No urgency, frequency, dysuria, or hematuria. Musculoskeletal:  Severe rheumatoid arthritis (off MTX and on prednisone).  Osteoporosis.  No back pain. No muscle tenderness. Extremities:  No pain or swelling. Skin:  No increased bruising or bleeding.  No rashes or skin changes. Neuro:  Neuropathy (stable).  No headache, numbness or weakness, balance or coordination issues. Endocrine:  Diabetes on an insulin pump.  Occasional hot flashes.  Psych:  No mood changes, depression or anxiety. Pain: No pain. Review of systems:  All other systems reviewed and found to be negative.  Physical Exam: Blood pressure 112/65, pulse 76, temperature 98.1 F (36.7 C), temperature source Tympanic, resp. rate 18, weight 140 lb 14 oz (63.9 kg). GENERAL:  Well developed, well nourished woman sitting comfortably in the exam  room in no acute distress. MENTAL STATUS:  Alert and oriented to person, place and time. HEAD:  Curly gray hair.  Normocephalic, atraumatic, face symmetric, no Cushingoid features. EYES:  Gold rimmed glasses.  Blue eyes.  No conjunctivitis or scleral icterus.  ENT: Oropharynx clear without lesion. Tongue normal. Mucous membranes moist.  RESPIRATORY: Clear to auscultation without rales, wheezes or rhonchi. CARDIOVASCULAR: Regular rate and rhythm without murmur, rub or gallop. ABDOMEN: Soft, non-tender with active bowel sounds and no hepatosplenomegaly. No palpable nodularity or masses.  No shifting dullness.  Insulin pump. Colostomy bag on left side. SKIN: Bruising on right side of abdomen (at Lovenox injection sites). No rashes or ulcers. EXTREMITIES:  No edema, skin discoloration or tenderness. No palpable cords. LYMPH NODES: No palpable cervical, supraclavicular, axillary or inguinal adenopathy  NEUROLOGICAL: Appropriate. PSYCH: Appropriate.   Radiology studies: 02/13/2015:  Abdomen and pelvic CT revealed bilateral mass-like adnexal regions (right adnexal mass 5.6 x 5.0 cm and the left adnexa mass 10.3 x 6.0 x 9.9 cm).  There was a large amount of soft tissue throughout the peritoneal cavity involving the omentum and other peritoneal surfaces.  There was a small volume ascites. There was a peripheral 3.3 x 1.9 cm low-attenuation lesion overlying the right lobe of the liver, likely representing a serosal implant. There was 1.4 x 2.2 cm ill-defined peripheral lesion within the inferior aspect of segment 6 adjacent to the ampulla in the duodenum.  04/28/2015:  Abdominal and pelvic CT revealed decreasing bilateral ovarian masses.   The left adnexal mass measured 4.0 x 6.1 cm (previously 5.0 x 8.5 cm). The right ovary measured 3.8 x 4.9 cm (previously 4.7 x 5.6 cm).  There was improved peritoneal carcinomatosis.  There was a small amount of ascites.  The hepatic dome lesion was stable. The  previously seen right hepatic lobe lesion was not well-visualized.  There was a small left pleural effusion and trace right pleural effusion.  The nodular lesion at the ampulla of Vater, extending into  the duodenum was stable.  There was no biliary ductal dilatation. 08/01/2015:  Rght upper extremity ultrasound revealed a near occlusive thrombus within the central portion of the right internal jugular vein and central portion of the right subclavian vein. 09/01/2015:  Chest, abdomen, and pelvic CT revealed continued decrease in perihepatic fluid collection contiguous with the right pleural space with percutaneous drain.  09/11/2015:  Chest, abdomen, and pelvic CT revealed a residual versus recurrent fluid collection posterior to the right hepatic lobe (2.6 x 1.1 x 4.0 cm).   10/13/2015:  Chest, abdomen, and pelvic CT revealed resolution of the empyema. 02/15/2016:  Chest, abdomen, and pelvic CT revealed multiple soft tissue nodules throughout the pelvis, small bowel mesenteric and peritoneum and, concerning for peritoneal metastatic disease.  There was soft tissue irregularity within the left upper quadrant.  There was mild right hydronephrosis (etiology unclear).  There was interval resolution of previously described right pleural based fluid and gas collection. There was a pleural based nodule within the right lower hemi-thorax concerning for pleural based metastasis.  There were small bilateral pleural effusions.  There was pulmonary nodularity, predominately within the left upper lobe (metastatic or infectious/inflammatory etiology).  There was slightly increased mediastinal adenopathy (infectious/inflammatory or metastatic).  There was a low attenuation lesion within the left hepatic lobe (complicated fluid within the fissure or metastatic disease). 03/08/2016:  Abdomen and pelvic CT revealed mild progression of peritoneal disease in the abdomen.  There was interval increase in loculated fluid around the  lateral segment left liver, stomach, and spleen.  There was persistent soft tissue lesion at the level of the ampulla with mild intra and extrahepatic biliary duct dilatation.  There was persistent intrahepatic and capsular metastatic disease involving the liver. 05/26/2016:  Head MRI revealed multiple small areas of acute infarct involving the occipital parietal lobe bilaterally and the left lateral cerebellum,  consistent with posterior circulation emboli.  06/10/2016:  Adomen and pelvic CT revealed progression of peritoneal carcinomatosis predominantly in the perihepatic space, bilateral lower quadrants and pelvis.  There was worsening bilateral obstructive uropathy due to malignant involvement of the pelvic ureters.  There was stable small pleural/subpleural nodules at the right lung base.  There was stable small left pleural effusion.  There was decreased small volume perihepatic ascites.    Appointment on 07/07/2016  Component Date Value Ref Range Status  . WBC 07/07/2016 3.2* 3.6 - 11.0 K/uL Final  . RBC 07/07/2016 3.22* 3.80 - 5.20 MIL/uL Final  . Hemoglobin 07/07/2016 10.6* 12.0 - 16.0 g/dL Final  . HCT 07/07/2016 30.6* 35.0 - 47.0 % Final  . MCV 07/07/2016 95.1  80.0 - 100.0 fL Final  . MCH 07/07/2016 32.9  26.0 - 34.0 pg Final  . MCHC 07/07/2016 34.6  32.0 - 36.0 g/dL Final  . RDW 07/07/2016 16.8* 11.5 - 14.5 % Final  . Platelets 07/07/2016 224  150 - 440 K/uL Final  . Neutrophils Relative % 07/07/2016 62  % Final  . Neutro Abs 07/07/2016 2.0  1.4 - 6.5 K/uL Final  . Lymphocytes Relative 07/07/2016 23  % Final  . Lymphs Abs 07/07/2016 0.7* 1.0 - 3.6 K/uL Final  . Monocytes Relative 07/07/2016 13  % Final  . Monocytes Absolute 07/07/2016 0.4  0.2 - 0.9 K/uL Final  . Eosinophils Relative 07/07/2016 2  % Final  . Eosinophils Absolute 07/07/2016 0.1  0 - 0.7 K/uL Final  . Basophils Relative 07/07/2016 0  % Final  . Basophils Absolute 07/07/2016 0.0  0 -  0.1 K/uL Final  . Sodium  07/07/2016 134* 135 - 145 mmol/L Final  . Potassium 07/07/2016 4.1  3.5 - 5.1 mmol/L Final  . Chloride 07/07/2016 99* 101 - 111 mmol/L Final  . CO2 07/07/2016 26  22 - 32 mmol/L Final  . Glucose, Bld 07/07/2016 219* 65 - 99 mg/dL Final  . BUN 07/07/2016 18  6 - 20 mg/dL Final  . Creatinine, Ser 07/07/2016 0.70  0.44 - 1.00 mg/dL Final  . Calcium 07/07/2016 9.3  8.9 - 10.3 mg/dL Final  . GFR calc non Af Amer 07/07/2016 >60  >60 mL/min Final  . GFR calc Af Amer 07/07/2016 >60  >60 mL/min Final   Comment: (NOTE) The eGFR has been calculated using the CKD EPI equation. This calculation has not been validated in all clinical situations. eGFR's persistently <60 mL/min signify possible Chronic Kidney Disease.   . Anion gap 07/07/2016 9  5 - 15 Final  . Magnesium 07/07/2016 1.5* 1.7 - 2.4 mg/dL Final    Assessment:  [name] is a 71 y.o. female with stage IV ovarian cancer.  She presented with abdominal discomfort and bloating.  Omental biopsy on 02/23/2015 revealed metastatic high grade serous carcinoma, consistent with gynecologic origin.   She was initially diagnosed with clinical stage IIIC (T3cN1Mx).  CA125 was 707 on 02/17/2015.  Abdomen and pelvic CT scan on 02/13/2015 revealed bilateral mass-like adnexal regions (right adnexal mass 5.6 x 5.0 cm and the left adnexa mass 10.3 x 6.0 x 9.9 cm).  There was a large amount of soft tissue throughout the peritoneal cavity involving the omentum and other peritoneal surfaces.  There was a small volume ascites. There was a peripheral 3.3 x 1.9 cm low-attenuation lesion overlying the right lobe of the liver, likely representing a serosal implant. There was 1.4 x 2.2 cm ill-defined peripheral lesion within the inferior aspect of segment 6 adjacent to the ampulla in the duodenum.   She received 4 cycles of neoadjuvant carboplatin and Taxol (03/05/2015 - 05/22/2015).  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  She had loose stools on oral  magnesium.  She was initially on Neurontin then switched Lyrica with cycle #3.  Cycle #4 was notable for neutropenia (ANC 300) requiring GCSF x 3 days.    She received tamoxifen from 02/25/2016 - 03/14/2016.  She received 4 cycles of carboplatin and gemcitabine (03/21/2016 - 05/24/2016) with GCSF/Neulasta support.  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  CA125 was 802.9 on 03/30/2015, 567.9 on 04/13/2015, 168.8 on 05/15/2015, 85.2 on 07/17/2015, 68.6 on 07/28/2015, 34.5 on 09/17/2015, 20.7 on 11/06/2015, 49 on 01/22/2016, 106.1 on 02/22/2016, 138.8 on 03/07/2016, 220.8 on 03/21/2016, 152.8 on 04/11/2016, 125.6 on 04/18/2016, 76.8 on 05/03/2016, and 60.2 on 05/24/2016.  She underwent exploratory laparotomy, lysis of adhesions, total abdominal hysterectomy with bilateral salpingo-oophorectomy, infracolic omentectomy, optimal tumor debulking(< 1 cm), recto-sigmoid resection with creation of end colostomy, cholecystectomy, mobilization of splenic flexure and liver with diaphragmatic stripping on 06/15/2015. The right diaphragm was cleared of tumor. During dissection, the diaphragm was entered and closed with sutures.   She had a recurrent right sided pleural effusion.  She underwent thoracentesis of 650 cc on post-operative day 3.  She was admitted to [facility] on 07/01/2015 and 07/07/2015 for recurrent shortness of breath.  She underwent 2 additional thoracenteses (1.1 L on 07/02/2015 and 850 cc on 07/08/2015).  Cytology was negative x 2.  Bilateral lower extremity duplex on 07/03/2015 was negative.  Echo  revealed an EF of 55-60% on 07/08/2015 and 60-65% on 05/27/2016.    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 She was on Lovenox 60 mg twice a day.  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  She  was admitted to [facility] from 07/29/2015 - 08/10/2015 with a right-sided empyema and liver abscess. She underwent CT-guided placement of a liver abscess drain on 07/30/2015.  Liver abscess culture grew out group B strep and Enterobacter which was sensitive to Zosyn. She was transitioned to ertapenem Colbert Ewing) prior to discharge.  She was readmitted to [facility] on 09/17/2015.  She was admitted to [facility] from 05/26/2016 - 05/28/2016 with altered mental status and an acute embolic CVA.  Head MRI on 05/26/2016 revealed multiple small areas of acute infarct involving the occipital parietal lobe bilaterally and the left lateral cerebellum,  consistent with posterior circulation emboli.  Work-up included a negative carotid ultrasound and echocardiogram.  She has severe rheumatoid arthritis.  Methotrexate and Enbrel were initially on hold.  She has a normocytic anemia.  Work-up on 02/17/2015 and 05/24/2016 revealed a normal ferritin, B12, folate, TSH.  She denies any melena or hematochezia.    She has anemia due to chronic disease. She received 1 unit PRBCs during her admission at [facility]. She denies any melena or hematochezia. She has diabetes and is on an insulin pump.  She has had persistent neutropenia felt secondary to her rheumatoid arthritis.  Folate and MMA were normal.  TSH was 6.13 (high) with a free T4 of 1.17 (0.61-1.12).  She began methotrexate (10 mg a week) and prednisone (5 mg a day) for severe rheumatoid arthritis on 12/17/2015.  She remains on prednisone alone (5 mg a day).  Bone marrow aspirate and biopsy on 06/09/2010 revealed a hypercellular marrow (70%) with no evidence of dysplasia or malignancy.  Flow cytometry was negative.  Cytogenetics were normal (46,XX).  FISH studies were negative for MDS.  Bone marrow aspirate and biopsy on 12/03/2015 revealed a normocellular to mildy hypercellular marrow for age (40%) with left shifted myelopoiesis, non specific dyserythropoiesis and mild megakaryocytic  atypia with no increase in blasts.  There were multiple small nonspecific lymphoid aggregates (favor reactive). There was no increase in reticulin.  There was decreased myeloid cells (37%) with left shifted maturation and 1% atypical myelod blasts.  There was relatively increased monocytic cells (11%), relatively increased lymphoid cells (36%), and relatively increased eosinophils (6%).  Cytogenetics were normal (20, XX).  SNP microarray was normal.  Adomen and pelvic CT scan on 06/10/2016 revealed progression of peritoneal carcinomatosis predominantly in the perihepatic space, bilateral lower quadrants and pelvis.  There was worsening bilateral obstructive uropathy due to malignant involvement of the pelvic ureters.  There was stable small pleural/subpleural nodules at the right lung base.  There was stable small left pleural effusion.  There was decreased small volume perihepatic ascites.   She is currently day 17 of cycle #1 Doxil (06/21/2016) with Neulasta support.  She tolerated her chemotherapy well.  Symptomatically, she feels good.  Counts are good.  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  Plan: 1.  Labs today:  CBC with diff, BMP, Mg. 2.  Discuss urology consult.  Plan for close monitoring of kidney function. 3.  Magnesium sulfate 2 gm IV today. 4.  Continue Lovenox. 5.  RTC in  1 week for labs (BMP, Mg) +/- IV Mg 6.  RTC on 07/19/2016 for MD assessment, labs (CBC with diff, CMP, Mg, CA125, Mg), and cycle #2 Doxil.  I saw and evaluated the patient, participating in the key portions of the service and reviewing pertinent diagnostic studies and records.  I reviewed the nurse practitioner's note and agree with the findings and the plan.  The assessment and plan were discussed with the patient.  A few questions were asked by the patient and answered.    Lucendia Herrlich, NP   Lequita Asal, MD  07/07/2016, 11:39 AM

## 2016-07-07 NOTE — Progress Notes (Signed)
Patient asking if she needs to continue taking her potassium. Pharmacy called her about a refill but she did not fill it yet.  Patient states her stools are loose and runny.  Patient has colostomy.

## 2016-07-08 ENCOUNTER — Encounter: Payer: Medicare Other | Attending: Surgery | Admitting: Surgery

## 2016-07-08 DIAGNOSIS — I110 Hypertensive heart disease with heart failure: Secondary | ICD-10-CM | POA: Diagnosis not present

## 2016-07-08 DIAGNOSIS — I5032 Chronic diastolic (congestive) heart failure: Secondary | ICD-10-CM | POA: Diagnosis not present

## 2016-07-08 DIAGNOSIS — L89623 Pressure ulcer of left heel, stage 3: Secondary | ICD-10-CM | POA: Insufficient documentation

## 2016-07-08 DIAGNOSIS — Z87891 Personal history of nicotine dependence: Secondary | ICD-10-CM | POA: Insufficient documentation

## 2016-07-08 DIAGNOSIS — Z794 Long term (current) use of insulin: Secondary | ICD-10-CM | POA: Insufficient documentation

## 2016-07-08 DIAGNOSIS — C787 Secondary malignant neoplasm of liver and intrahepatic bile duct: Secondary | ICD-10-CM | POA: Diagnosis not present

## 2016-07-08 DIAGNOSIS — Z79899 Other long term (current) drug therapy: Secondary | ICD-10-CM | POA: Diagnosis not present

## 2016-07-08 DIAGNOSIS — E1040 Type 1 diabetes mellitus with diabetic neuropathy, unspecified: Secondary | ICD-10-CM | POA: Diagnosis not present

## 2016-07-08 DIAGNOSIS — Z9221 Personal history of antineoplastic chemotherapy: Secondary | ICD-10-CM | POA: Insufficient documentation

## 2016-07-08 DIAGNOSIS — M069 Rheumatoid arthritis, unspecified: Secondary | ICD-10-CM | POA: Diagnosis not present

## 2016-07-08 DIAGNOSIS — C786 Secondary malignant neoplasm of retroperitoneum and peritoneum: Secondary | ICD-10-CM | POA: Insufficient documentation

## 2016-07-08 DIAGNOSIS — Z7952 Long term (current) use of systemic steroids: Secondary | ICD-10-CM | POA: Insufficient documentation

## 2016-07-08 DIAGNOSIS — E10621 Type 1 diabetes mellitus with foot ulcer: Secondary | ICD-10-CM | POA: Insufficient documentation

## 2016-07-08 DIAGNOSIS — I2510 Atherosclerotic heart disease of native coronary artery without angina pectoris: Secondary | ICD-10-CM | POA: Insufficient documentation

## 2016-07-09 NOTE — Progress Notes (Signed)
FATHIMA, BARTL ([id number]) Visit Report for 07/08/2016 Chief Complaint Document Details Patient Name: [name], [name] Date of Service: 07/08/2016 10:30 AM Medical Record Number: [id number] Patient Account Number: [id number] Date of Birth/Sex: [birth year] (70 y.o. Female) Treating RN: Baruch Gouty, RN, BSN, Velva Harman Primary Care Provider: Caryl Bis, ERIC Other Clinician: Referring Provider: Caryl Bis, ERIC Treating Provider/Extender: Frann Rider in Treatment: 27 Information Obtained from: Patient Chief Complaint [name] presents for evaluation of her left posterior heel pressure ulcer Electronic Signature(s) Signed: 07/08/2016 10:47:39 AM By: Christin Fudge MD, FACS Entered By: Christin Fudge on 07/08/2016 10:47:39 [name] ([id number]) -------------------------------------------------------------------------------- HPI Details Patient Name: [name] Date of Service: 07/08/2016 10:30 AM Medical Record Number: [id number] Patient Account Number: [id number] Date of Birth/Sex: February 11, 1946 (70 y.o. Female) Treating RN: Afful, RN, BSN, Velva Harman Primary Care Provider: Caryl Bis, ERIC Other Clinician: Referring Provider: Caryl Bis, ERIC Treating Provider/Extender: Frann Rider in Treatment: 27 History of Present Illness Location: right heel Quality: Patient reports experiencing a dull pain to affected area(s). Severity: Patient states wound are getting worse. Duration: Patient has had the wound for <5 months prior to presenting for treatment Timing: Pain in wound is Intermittent (comes and goes Context: The wound appeared gradually over time Modifying Factors: Consults to this date include: local care was done as per the medication given by the PCP which may have been medihoney Associated Signs and Symptoms: Patient reports having difficulty standing for long periods. 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 In the past she was seen for a decubitus ulcer on her right gluteal area. She is known to have diabetes mellitus type 1 and her most recent A1c was 7.2% Past medical history significant for leukopenia, cholelithiasis, hypertension, chronic diastolic CHF, coronary artery disease,type 1 diabetes mellitus, rheumatoid arthritis, collagen vascular disease, ovarian cancer, status post CABG in 2014 and status post Port-A-Cath insertion by Dr. Leotis Pain in October 2016. She has quit smoking about 20 years ago. She was advised to use Medihoney with calcium alginate pads to be applied over the wound. 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 01/07/2016 -- x-ray of the left heel done -- IMPRESSION: No radiographic evidence of osteomyelitis. If this remains a clinical concern, recommend MR imaging. 02/05/16: returns today for f/u. denies fever, chills, body aches or malaise. no interval changes regarding health status. 02/12/16: pt is doing well. wound has improved. no s/s of infection. no systemic s/s of infection. She reports will be on vacation next week, and requests appointment in 2 weeks. 03/18/2016 - is going to be starting her chemotherapy this coming Monday and will have cycles every 3 weeks 04-01-16 [name], accompanied by her husband, presents for evaluation of her left posterior heel stage III pressure ulcer. She states that she started chemotherapy last week. Her chemotherapy is on a cycle of one week on 3 weeks off, with her next treatment  scheduled for December 11. There has been an increase in drainage since her last appointment. She denies any pain. They have been  using PolyMem silver rope. SARRA, RACHELS ([id number]) 04/22/16 - [name] presents today, accompanied by her husband, for a violation of her left posterior low heel pressure ulcer. She continues to receive chemotherapy, with the exception of last week due to neutropenia. Her next scheduled chemotherapy is on January 2. She states that her chemotherapy schedule is 1 day on 8 days off. She and her husband deny any overt changes to her wound, denies pain, denies any increased drainage. 04/29/2016 -- the copayment for the skin substitute is about $300 a week and they have declined use of this material. Electronic Signature(s) Signed: 07/08/2016 10:47:46 AM By: Christin Fudge MD, FACS Entered By: Christin Fudge on 07/08/2016 10:47:46 [name] ([id number]) -------------------------------------------------------------------------------- Physical Exam Details Patient Name: [name] Date of Service: 07/08/2016 10:30 AM Medical Record Number: [id number] Patient Account Number: [id number] Date of Birth/Sex: Nov 07, 1945 (70 y.o. Female) Treating RN: Baruch Gouty, RN, BSN, Velva Harman Primary Care Provider: Caryl Bis, ERIC Other Clinician: Referring Provider: Caryl Bis, ERIC Treating Provider/Extender: Frann Rider in Treatment: 27 Constitutional . Pulse regular. Respirations normal and unlabored. Afebrile. . Eyes Nonicteric. Reactive to light. Ears, Nose, Mouth, and Throat Lips, teeth, and gums WNL.Marland Kitchen Moist mucosa without lesions. Neck supple and nontender. No palpable supraclavicular or cervical adenopathy. Normal sized without goiter. Respiratory WNL. No retractions.. Breath sounds WNL, No rubs, rales, rhonchi, or wheeze.. Cardiovascular Heart rhythm and rate regular, no murmur or gallop.. Pedal Pulses WNL. No clubbing, cyanosis or edema. Chest Breasts  symmetical and no nipple discharge.. Breast tissue WNL, no masses, lumps, or tenderness.. Lymphatic No adneopathy. No adenopathy. No adenopathy. Musculoskeletal Adexa without tenderness or enlargement.. Digits and nails w/o clubbing, cyanosis, infection, petechiae, ischemia, or inflammatory conditions.. Integumentary (Hair, Skin) No suspicious lesions. No crepitus or fluctuance. No peri-wound warmth or erythema. No masses.Marland Kitchen Psychiatric Judgement and insight Intact.. No evidence of depression, anxiety, or agitation.. Notes the wound is looking excellent and it barely admits the tip of a fine probe which has no surrounding cellulitis or discharge. Electronic Signature(s) Signed: 07/08/2016 10:48:15 AM By: Christin Fudge MD, FACS Entered By: Christin Fudge on 07/08/2016 10:48:15 [name] ([id number]) -------------------------------------------------------------------------------- Physician Orders Details Patient Name: [name] Date of Service: 07/08/2016 10:30 AM Medical Record Number: [id number] Patient Account Number: [id number] Date of Birth/Sex: [birth year] (70 y.o. Female) Treating RN: Afful, RN, BSN, Velva Harman Primary Care Provider: Caryl Bis, ERIC Other Clinician: Referring Provider: Caryl Bis, ERIC Treating Provider/Extender: Frann Rider in Treatment: 65 Verbal / Phone Orders: No Diagnosis Coding Wound Cleansing Wound #2 Left Calcaneus o Clean wound with Normal Saline. Primary Wound Dressing Wound #2 Left Calcaneus o Iodoform packing Gauze - 1/4 inch o Other: - Endoform at home Secondary Dressing Wound #2 Left Calcaneus o Dry Gauze - 2x2 o Boardered Foam Dressing Dressing Change Frequency Wound #2 Left Calcaneus o Change dressing every other day. Follow-up Appointments Wound #2 Left Calcaneus o Return Appointment in 1 month - due to financial strains Edema Control Wound #2 Left Calcaneus o Patient to wear own compression stockings -  Patient instructed to wear 20-30 mmHg gradient o Elevate legs to the level of the heart and pump ankles as often as possible o Support Garment 20-30 mm/Hg pressure to: Additional Orders / Instructions Wound #2 Left Calcaneus o Activity as tolerated Electronic Signature(s) Signed: 07/08/2016 3:56:49 PM By: Christin Fudge MD, FACS KATHLEN, SAKURAI ([id number]) Signed: 07/08/2016 4:16:44 PM By: Regan Lemming BSN, RN Entered By: Regan Lemming on 07/08/2016 10:44:04 [name] ([id number]) --------------------------------------------------------------------------------  Problem List Details Patient Name: REYGAN, HEAGLE Date of Service: 07/08/2016 10:30 AM Medical Record Number: [id number] Patient Account Number: [id number] Date of Birth/Sex: November 09, 1945 (70 y.o. Female) Treating RN: Baruch Gouty, RN, BSN, Velva Harman Primary Care Provider: Caryl Bis, ERIC Other Clinician: Referring Provider: Caryl Bis, ERIC Treating Provider/Extender: Frann Rider in Treatment: 27 Active Problems ICD-10 Encounter Code Description Active Date Diagnosis E10.621 Type 1 diabetes mellitus with foot ulcer 12/31/2015 Yes L89.623 Pressure ulcer of left heel, stage 3 12/31/2015 Yes Z92.21 Personal history of antineoplastic chemotherapy 12/31/2015 Yes Z79.52 Long term (current) use of systemic steroids 12/31/2015 Yes Inactive Problems Resolved Problems Electronic Signature(s) Signed: 07/08/2016 10:47:28 AM By: Christin Fudge MD, FACS Entered By: Christin Fudge on 07/08/2016 10:47:28 [name] ([id number]) -------------------------------------------------------------------------------- Progress Note Details Patient Name: [name] Date of Service: 07/08/2016 10:30 AM Medical Record Number: [id number] Patient Account Number: [id number] Date of Birth/Sex: [birth year] (70 y.o. Female) Treating RN: Baruch Gouty, RN, BSN, Velva Harman Primary Care Provider: Caryl Bis, ERIC Other Clinician: Referring Provider: Caryl Bis,  ERIC Treating Provider/Extender: Frann Rider in Treatment: 27 Subjective Chief Complaint Information obtained from Patient Mrs. Plath presents for evaluation of her left posterior heel pressure ulcer History of Present Illness (HPI) The following HPI elements were documented for the patient's wound: Location: right heel Quality: Patient reports experiencing a dull pain to affected area(s). Severity: Patient states wound are getting worse. Duration: Patient has had the wound for Timing: Pain in wound is Intermittent (comes and goes Context: The wound appeared gradually over time Modifying Factors: Consults to this date include: local care was done as per the medication given by the PCP which may have been medihoney Associated Signs and Symptoms: Patient reports having difficulty standing for long periods. 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 In the past she was seen for a decubitus ulcer on her right gluteal area. She is known to have diabetes mellitus type 1 and her most recent A1c was 7.2% Past medical history significant for leukopenia, cholelithiasis, hypertension, chronic diastolic CHF, coronary artery disease,type 1 diabetes mellitus, rheumatoid arthritis, collagen vascular disease, ovarian cancer, status post CABG in 2014 and status post Port-A-Cath insertion by Dr. Leotis Pain in October 2016. She has quit smoking about 20 years ago. She was advised to use Medihoney with calcium alginate pads to be applied over the wound. 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 01/07/2016 -- x-ray of the left heel done -- IMPRESSION: No radiographic evidence of osteomyelitis. If this remains a clinical concern, recommend MR imaging. 02/05/16: returns today for f/u. denies fever, chills, body aches or malaise. no interval changes regarding health status. 02/12/16: pt is doing well. wound has improved. no s/s of infection. no systemic s/s of infection. She reports will be on vacation next week, and requests appointment in 2 weeks. 03/18/2016 - is going to be starting her chemotherapy this coming Monday and will have cycles every 3 NIKA, YAZZIE. ([id number]) weeks 04-01-16 Mrs. Beckett, accompanied by her husband, presents for evaluation of her left posterior heel stage  III pressure ulcer. She states that she started chemotherapy last week. Her chemotherapy is on a cycle of one week on 3 weeks off, with her next treatment scheduled for December 11. There has been an increase in drainage since her last appointment. She denies any pain. They have been using PolyMem silver rope. 04/22/16 - Mrs. Hinesley presents today, accompanied by her husband, for a violation of her left posterior low heel pressure ulcer. She continues to receive chemotherapy, with the exception of last week due to neutropenia. Her next scheduled chemotherapy is on January 2. She states that her chemotherapy schedule is 1 day on 8 days off. She and her husband deny any overt changes to her wound, denies pain, denies any increased drainage. 04/29/2016 -- the copayment for the skin substitute is about $300 a week and they have declined use of this material. Objective Constitutional Pulse regular. Respirations normal and unlabored. Afebrile. Vitals Time Taken: 10:22 AM, Height: 63 in, Weight: 130 lbs, BMI: 23, Temperature: 97.6 F, Pulse: 71 bpm, Respiratory Rate: 16 breaths/min, Blood Pressure: 131/45 mmHg. Eyes Nonicteric.  Reactive to light. Ears, Nose, Mouth, and Throat Lips, teeth, and gums WNL.Marland Kitchen Moist mucosa without lesions. Neck supple and nontender. No palpable supraclavicular or cervical adenopathy. Normal sized without goiter. Respiratory WNL. No retractions.. Breath sounds WNL, No rubs, rales, rhonchi, or wheeze.. Cardiovascular Heart rhythm and rate regular, no murmur or gallop.. Pedal Pulses WNL. No clubbing, cyanosis or edema. Chest Breasts symmetical and no nipple discharge.. Breast tissue WNL, no masses, lumps, or tenderness.. Lymphatic No adneopathy. No adenopathy. No adenopathy. CHENEY, GOSCH ([id number]) Musculoskeletal Adexa without tenderness or enlargement.. Digits and nails w/o clubbing, cyanosis, infection, petechiae, ischemia, or inflammatory conditions.Marland Kitchen Psychiatric Judgement and insight Intact.. No evidence of depression, anxiety, or agitation.. General Notes: the wound is looking excellent and it barely admits the tip of a fine probe which has no surrounding cellulitis or discharge. Integumentary (Hair, Skin) No suspicious lesions. No crepitus or fluctuance. No peri-wound warmth or erythema. No masses.. Wound #2 status is Open. Original cause of wound was Pressure Injury. The wound is located on the Left Calcaneus. The wound measures 0.3cm length x 0.3cm width x 0.4cm depth; 0.071cm^2 area and 0.028cm^3 volume. There is no tunneling or undermining noted. There is a small amount of serous drainage noted. The wound margin is flat and intact. There is medium (34-66%) pale granulation within the wound bed. There is no necrotic tissue within the wound bed. The periwound skin appearance did not exhibit: Callus, Crepitus, Excoriation, Induration, Rash, Scarring, Dry/Scaly, Maceration, Atrophie Blanche, Cyanosis, Ecchymosis, Hemosiderin Staining, Mottled, Pallor, Rubor, Erythema. Periwound temperature was noted as No Abnormality. The periwound has tenderness on  palpation. Assessment Active Problems ICD-10 E10.621 - Type 1 diabetes mellitus with foot ulcer L89.623 - Pressure ulcer of left heel, stage 3 Z92.21 - Personal history of antineoplastic chemotherapy Z79.52 - Long term (current) use of systemic steroids Plan Wound Cleansing: Wound #2 Left Calcaneus: Clean wound with Normal Saline. Primary Wound Dressing: Wound #2 Left Calcaneus: Iodoform packing Gauze - 1/4 inch Other: - Endoform at home KONI, KANNAN. ([id number]) Secondary Dressing: Wound #2 Left Calcaneus: Dry Gauze - 2x2 Boardered Foam Dressing Dressing Change Frequency: Wound #2 Left Calcaneus: Change dressing every other day. Follow-up Appointments: Wound #2 Left Calcaneus: Return Appointment in 1 month - due to financial strains Edema Control: Wound #2 Left Calcaneus: Patient to wear own compression stockings - Patient instructed to wear 20-30 mmHg gradient Elevate legs to the  level of the heart and pump ankles as often as possible Support Garment 20-30 mm/Hg pressure to: Additional Orders / Instructions: Wound #2 Left Calcaneus: Activity as tolerated They have been using endoform gently packing into the wound, and I have asked him to continue this. She will change it daily or every other day She has a bit of lower extremity lymphedema and I was asked her to use compression stockings and to be careful about offloading. Nutrition, vitamins have also been discussed with her. She is still taking her chemotherapy currently. Due to high price of her copayments they want to spread out the visits and will come once in about 3 weeks Electronic Signature(s) Signed: 07/08/2016 10:50:10 AM By: Christin Fudge MD, FACS Entered By: Christin Fudge on 07/08/2016 10:50:10 [name] ([id number]) -------------------------------------------------------------------------------- SuperBill Details Patient Name: [name] Date of Service: 07/08/2016 Medical Record Number:  [id number] Patient Account Number: [id number] Date of Birth/Sex: 16-Jun-1945 (70 y.o. Female) Treating RN: Afful, RN, BSN, Velva Harman Primary Care Provider: Caryl Bis, ERIC Other Clinician: Referring Provider: Caryl Bis, ERIC Treating Provider/Extender: Christin Fudge Service Line: Outpatient Weeks in Treatment: 27 Diagnosis Coding ICD-10 Codes Code Description E10.621 Type 1 diabetes mellitus with foot ulcer L89.623 Pressure ulcer of left heel, stage 3 Z92.21 Personal history of antineoplastic chemotherapy Z79.52 Long term (current) use of systemic steroids Facility Procedures CPT4 Code: [id number] Description: [phone] - WOUND CARE VISIT-LEV 2 EST PT Modifier: Quantity: 1 Physician Procedures CPT4 Code: [id number] Description: 74142 - WC PHYS LEVEL 3 - EST PT ICD-10 Description Diagnosis E10.621 Type 1 diabetes mellitus with foot ulcer L89.623 Pressure ulcer of left heel, stage 3 Z92.21 Personal history of antineoplastic chemother Modifier: apy Quantity: 1 Electronic Signature(s) Signed: 07/08/2016 10:50:30 AM By: Christin Fudge MD, FACS Entered By: Christin Fudge on 07/08/2016 10:50:29

## 2016-07-09 NOTE — Progress Notes (Addendum)
ANDI, LAYFIELD ([id number]) Visit Report for 07/08/2016 Arrival Information Details Patient Name: [name] Date of Service: 07/08/2016 10:30 AM Medical Record Number: [id number] Patient Account Number: [id number] Date of Birth/Sex: 1945-09-22 (71 y.o. Female) Treating RN: Afful, RN, BSN, Velva Harman Primary Care [name] [name]: [name] Other Clinician: Referring Jaleigha Deane: [name] Treating Sevyn Markham/Extender: Frann Rider in Treatment: 27 Visit Information History Since Last Visit All ordered tests and consults were completed: No Patient Arrived: Cane Added or deleted any medications: No Arrival Time: 10:20 Any new allergies or adverse reactions: No Accompanied By: husband Had a fall or experienced change in No Transfer Assistance: None activities of daily living that may affect Patient Identification Verified: Yes risk of falls: Secondary Verification Process Yes Signs or symptoms of abuse/neglect since last No Completed: visito Patient Requires Transmission- No Hospitalized since last visit: No Based Precautions: Has Dressing in Place as Prescribed: Yes Patient Has Alerts: Yes Pain Present Now: No Patient Alerts: Patient on Blood Thinner eliquis BID DMII Electronic Signature(s) Signed: 07/08/2016 10:20:40 AM By: Regan Lemming BSN, RN Entered By: Regan Lemming on 07/08/2016 10:20:39 [name] ([id number]) -------------------------------------------------------------------------------- Clinic Level of Care Assessment Details Patient Name: [name] Date of Service: 07/08/2016 10:30 AM Medical Record Number: [id number] Patient Account Number: [id number] Date of Birth/Sex: [birth year] (71 y.o. Female) Treating RN: Afful, RN, BSN, [place] Primary Care Kashayla Ungerer: [name] Other Clinician: Referring Codee Bloodworth: [name] Treating Elexius Minar/Extender: Frann Rider in Treatment: 27 Clinic Level of Care Assessment Items TOOL 4 Quantity  Score []  - Use when only an EandM is performed on FOLLOW-UP visit 0 ASSESSMENTS - Nursing Assessment / Reassessment X - Reassessment of Co-morbidities (includes updates in patient status) 1 10 X - Reassessment of Adherence to Treatment Plan 1 5 ASSESSMENTS - Wound and Skin Assessment / Reassessment X - Simple Wound Assessment / Reassessment - one wound 1 5 []  - Complex Wound Assessment / Reassessment - multiple wounds 0 []  - Dermatologic / Skin Assessment (not related to wound area) 0 ASSESSMENTS - Focused Assessment []  - Circumferential Edema Measurements - multi extremities 0 []  - Nutritional Assessment / Counseling / Intervention 0 []  - Lower Extremity Assessment (monofilament, tuning fork, pulses) 0 []  - Peripheral Arterial Disease Assessment (using hand held doppler) 0 ASSESSMENTS - Ostomy and/or Continence Assessment and Care []  - Incontinence Assessment and Management 0 []  - Ostomy Care Assessment and Management (repouching, etc.) 0 PROCESS - Coordination of Care X - Simple Patient / Family Education for ongoing care 1 15 []  - Complex (extensive) Patient / Family Education for ongoing care 0 []  - Staff obtains Programmer, systems, Records, Test Results / Process Orders 0 []  - Staff telephones HHA, Nursing Homes / Clarify orders / etc 0 []  - Routine Transfer to another Facility (non-emergent condition) 0 [name], [name]. ([id number]) []  - Routine Hospital Admission (non-emergent condition) 0 []  - New Admissions / Biomedical engineer / Ordering NPWT, Apligraf, etc. 0 []  - Emergency Hospital Admission (emergent condition) 0 []  - Simple Discharge Coordination 0 []  - Complex (extensive) Discharge Coordination 0 PROCESS - Special Needs []  - Pediatric / Minor Patient Management 0 []  - Isolation Patient Management 0 []  - Hearing / Language / Visual special needs 0 []  - Assessment of Community assistance (transportation, D/C planning, etc.) 0 []  - Additional assistance / Altered mentation  0 []  - Support Surface(s) Assessment (bed, cushion, seat, etc.) 0 INTERVENTIONS - Wound Cleansing / Measurement X - Simple Wound Cleansing - one wound 1 5 []  -  Complex Wound Cleansing - multiple wounds 0 X - Wound Imaging (photographs - any number of wounds) 1 5 []  - Wound Tracing (instead of photographs) 0 X - Simple Wound Measurement - one wound 1 5 []  - Complex Wound Measurement - multiple wounds 0 INTERVENTIONS - Wound Dressings []  - Small Wound Dressing one or multiple wounds 0 []  - Medium Wound Dressing one or multiple wounds 0 []  - Large Wound Dressing one or multiple wounds 0 []  - Application of Medications - topical 0 []  - Application of Medications - injection 0 INTERVENTIONS - Miscellaneous []  - External ear exam 0 [name], [name]. ([id number]) []  - Specimen Collection (cultures, biopsies, blood, body fluids, etc.) 0 []  - Specimen(s) / Culture(s) sent or taken to Lab for analysis 0 []  - Patient Transfer (multiple staff / Harrel Lemon Lift / Similar devices) 0 []  - Simple Staple / Suture removal (25 or less) 0 []  - Complex Staple / Suture removal (26 or more) 0 []  - Hypo / Hyperglycemic Management (close monitor of Blood Glucose) 0 []  - Ankle / Brachial Index (ABI) - do not check if billed separately 0 X - Vital Signs 1 5 Has the patient been seen at the hospital within the last three years: Yes Total Score: 55 Level Of Care: New/Established - Level 2 Electronic Signature(s) Signed: 07/08/2016 4:16:44 PM By: Regan Lemming BSN, RN Entered By: Regan Lemming on 07/08/2016 10:43:06 [name] ([id number]) -------------------------------------------------------------------------------- Encounter Discharge Information Details Patient Name: [name] Date of Service: 07/08/2016 10:30 AM Medical Record Number: [id number] Patient Account Number: [id number] Date of Birth/Sex: April 17, 1946 (71 y.o. Female) Treating RN: Baruch Gouty, RN, BSN, Velva Harman Primary Care Jennings Corado: Janet Bis,  [name] Other Clinician: Referring Elienai Gailey: [name] Treating Alieah Brinton/Extender: Frann Rider in Treatment: 27 Encounter Discharge Information Items Discharge Pain Level: 0 Discharge Condition: Stable Ambulatory Status: Cane Discharge Destination: Home Transportation: Private Auto Accompanied By: husband Schedule Follow-up Appointment: No Medication Reconciliation completed No and provided to Patient/Care Znya Albino: Provided on Clinical Summary of Care: 07/08/2016 Form Type Recipient Paper Patient SR Electronic Signature(s) Signed: 07/08/2016 4:16:44 PM By: Regan Lemming BSN, RN Previous Signature: 07/08/2016 10:42:55 AM Version By: Ruthine Dose Entered By: Regan Lemming on 07/08/2016 10:44:26 [name] ([id number]) -------------------------------------------------------------------------------- Lower Extremity Assessment Details Patient Name: [name] Date of Service: 07/08/2016 10:30 AM Medical Record Number: [id number] Patient Account Number: [id number] Date of Birth/Sex: 1946/01/30 (71 y.o. Female) Treating RN: Afful, RN, BSN, Velva Harman Primary Care Genavieve Mangiapane: [name] Other Clinician: Referring Arafat Cocuzza: [name] Treating Debbie Bellucci/Extender: Frann Rider in Treatment: 27 Edema Assessment Assessed: [Left: No] [Right: No] Edema: [Left: N] [Right: o] Vascular Assessment Claudication: Claudication Assessment [Left:None] Pulses: Dorsalis Pedis Palpable: [Left:Yes] Posterior Tibial Extremity colors, hair growth, and conditions: Extremity Color: [Left:Normal] Hair Growth on Extremity: [Left:No] Temperature of Extremity: [Left:Warm] Capillary Refill: [Left:< 3 seconds] Toe Nail Assessment Left: Right: Thick: No Discolored: No Deformed: No Improper Length and Hygiene: No Electronic Signature(s) Signed: 07/08/2016 10:21:07 AM By: Regan Lemming BSN, RN Entered By: Regan Lemming on 07/08/2016 10:21:07 [name]  ([id number]) -------------------------------------------------------------------------------- Multi Wound Chart Details Patient Name: [name] Date of Service: 07/08/2016 10:30 AM Medical Record Number: [id number] Patient Account Number: [id number] Date of Birth/Sex: April 04, 1946 (71 y.o. Female) Treating RN: Baruch Gouty, RN, BSN, Velva Harman Primary Care Anslie Spadafora: [name] Other Clinician: Referring Abbygayle Helfand: [name] Treating Jozelynn Danielson/Extender: Frann Rider in Treatment: 27 Vital Signs Height(in): 63 Pulse(bpm): 71 Weight(lbs): 130 Blood Pressure 131/45 (mmHg): Body Mass Index(BMI): 23 Temperature(F):  97.6 Respiratory Rate 16 (breaths/min): Photos: [2:No Photos] [N/A:N/A] Wound Location: [2:Left Calcaneus] [N/A:N/A] Wounding Event: [2:Pressure Injury] [N/A:N/A] Primary Etiology: [2:Pressure Ulcer] [N/A:N/A] Secondary Etiology: [2:Diabetic Wound/Ulcer of the Lower Extremity] [N/A:N/A] Comorbid History: [2:Cataracts, Type I Diabetes, Rheumatoid Arthritis, Osteoarthritis, Neuropathy, Received Chemotherapy] [N/A:N/A] Date Acquired: [2:07/15/2015] [N/A:N/A] Weeks of Treatment: [2:27] [N/A:N/A] Wound Status: [2:Open] [N/A:N/A] Measurements L x W x D 0.3x0.3x0.4 [N/A:N/A] (cm) Area (cm) : [2:0.071] [N/A:N/A] Volume (cm) : [2:0.028] [N/A:N/A] % Reduction in Area: [2:69.90%] [N/A:N/A] % Reduction in Volume: 70.20% [N/A:N/A] Classification: [2:Category/Stage III] [N/A:N/A] HBO Classification: [2:Grade 1] [N/A:N/A] Exudate Amount: [2:Small] [N/A:N/A] Exudate Type: [2:Serous] [N/A:N/A] Exudate Color: [2:amber] [N/A:N/A] Wound Margin: [2:Flat and Intact] [N/A:N/A] Granulation Amount: [2:Medium (34-66%)] [N/A:N/A] Granulation Quality: [2:Pale] [N/A:N/A] Necrotic Amount: [2:None Present (0%)] [N/A:N/A] Exposed Structures: [N/A:N/A] Fascia: No Fat Layer (Subcutaneous Tissue) Exposed: No Tendon: No Muscle: No Joint: No Bone: No Epithelialization: Large  (67-100%) N/A N/A Periwound Skin Texture: Excoriation: No N/A N/A Induration: No Callus: No Crepitus: No Rash: No Scarring: No Periwound Skin Maceration: No N/A N/A Moisture: Dry/Scaly: No Periwound Skin Color: Atrophie Blanche: No N/A N/A Cyanosis: No Ecchymosis: No Erythema: No Hemosiderin Staining: No Mottled: No Pallor: No Rubor: No Temperature: No Abnormality N/A N/A Tenderness on Yes N/A N/A Palpation: Wound Preparation: Ulcer Cleansing: N/A N/A Rinsed/Irrigated with Saline Topical Anesthetic Applied: None Treatment Notes Wound #2 (Left Calcaneus) 1. Cleansed with: Clean wound with Normal Saline 4. Dressing Applied: Iodoform packing Gauze 5. Secondary Dressing Applied Bordered Foam Dressing Dry Gauze Electronic Signature(s) Signed: 07/08/2016 10:47:32 AM By: Christin Fudge MD, FACS Entered By: Christin Fudge on 07/08/2016 10:47:32 NECIE, WILCOXSON ([id number]) VICKEY, BOAK ([id number]) -------------------------------------------------------------------------------- [place] Details Patient Name: [name] Date of Service: 07/08/2016 10:30 AM Medical Record Number: [id number] Patient Account Number: [id number] Date of Birth/Sex: Aug 27, 1945 (71 y.o. Female) Treating RN: Afful, RN, BSN, Velva Harman Primary Care Marselino Slayton: [name] Other Clinician: Referring Kourtney Montesinos: [name] Treating Jawad Wiacek/Extender: Frann Rider in Treatment: 74 Active Inactive ` Orientation to the Wound Care Program Nursing Diagnoses: Knowledge deficit related to the wound healing center program Goals: Patient/caregiver will verbalize understanding of the [place] Program Date Initiated: 12/31/2015 Target Resolution Date: 08/12/2016 Goal Status: Active Interventions: Provide education on orientation to the wound center Notes: ` Pressure Nursing Diagnoses: Knowledge deficit related to causes and risk factors for pressure ulcer  development Knowledge deficit related to management of pressures ulcers Potential for impaired tissue integrity related to pressure, friction, moisture, and shear Goals: Patient will remain free from development of additional pressure ulcers Date Initiated: 12/31/2015 Target Resolution Date: 08/12/2016 Goal Status: Active Patient will remain free of pressure ulcers Date Initiated: 12/31/2015 Target Resolution Date: 08/12/2016 Goal Status: Active Patient/caregiver will verbalize risk factors for pressure ulcer development Date Initiated: 12/31/2015 Target Resolution Date: 08/12/2016 Goal Status: Active Patient/caregiver will verbalize understanding of pressure ulcer management Date Initiated: 12/31/2015 Target Resolution Date: 08/12/2016 Goal Status: Active ERMINIA, MCNEW ([id number]) Interventions: Assess: immobility, friction, shearing, incontinence upon admission and as needed Assess offloading mechanisms upon admission and as needed Assess potential for pressure ulcer upon admission and as needed Provide education on pressure ulcers Treatment Activities: Patient referred for pressure reduction/relief devices : 12/31/2015 Notes: ` Wound/Skin Impairment Nursing Diagnoses: Impaired tissue integrity Knowledge deficit related to smoking impact on wound healing Knowledge deficit related to ulceration/compromised skin integrity Goals: Patient/caregiver will verbalize understanding of skin care regimen Date Initiated: 12/31/2015 Target Resolution Date: 08/12/2016 Goal Status: Active Ulcer/skin breakdown will have  a volume reduction of 30% by week 4 Date Initiated: 12/31/2015 Target Resolution Date: 08/12/2016 Goal Status: Active Ulcer/skin breakdown will have a volume reduction of 50% by week 8 Date Initiated: 12/31/2015 Target Resolution Date: 08/12/2016 Goal Status: Active Ulcer/skin breakdown will have a volume reduction of 80% by week 12 Date Initiated: 12/31/2015 Target Resolution  Date: 08/12/2016 Goal Status: Active Ulcer/skin breakdown will heal within 14 weeks Date Initiated: 12/31/2015 Target Resolution Date: 08/12/2016 Goal Status: Active Interventions: Assess patient/caregiver ability to obtain necessary supplies Assess patient/caregiver ability to perform ulcer/skin care regimen upon admission and as needed Assess ulceration(s) every visit Provide education on ulcer and skin care Treatment Activities: Skin care regimen initiated : 12/31/2015 Topical wound management initiated : 12/31/2015 ELLENIE, SALOME ([id number]) Notes: Electronic Signature(s) Signed: 07/08/2016 4:16:44 PM By: Regan Lemming BSN, RN Entered By: Regan Lemming on 07/08/2016 10:36:34 [name] ([id number]) -------------------------------------------------------------------------------- Pain Assessment Details Patient Name: [name] Date of Service: 07/08/2016 10:30 AM Medical Record Number: [id number] Patient Account Number: [id number] Date of Birth/Sex: [birth year] (71 y.o. Female) Treating RN: Afful, RN, BSN, Velva Harman Primary Care Dae Highley: [name] Other Clinician: Referring Jennilee [name]: [name] Treating Saloni Lablanc/Extender: Frann Rider in Treatment: 27 Active Problems Location of Pain Severity and Description of Pain Patient Has Paino No Site Locations With Dressing Change: No Pain Management and Medication Current Pain Management: Electronic Signature(s) Signed: 07/08/2016 10:20:48 AM By: Regan Lemming BSN, RN Entered By: Regan Lemming on 07/08/2016 10:20:48 [name] ([id number]) -------------------------------------------------------------------------------- Patient/Caregiver Education Details Patient Name: [name] Date of Service: 07/08/2016 10:30 AM Medical Record Number: [id number] Patient Account Number: [id number] Date of Birth/Gender: Aug 04, 1945 (71 y.o. Female) Treating RN: Baruch Gouty, RN, BSN, Velva Harman Primary Care Physician: Janet Bis,  [name] Other Clinician: Referring Physician: Caryl Bis, [name] Treating Physician/Extender: Frann Rider in Treatment: 41 Education Assessment Education Provided To: Patient Education Topics Provided Pressure: Methods: Explain/Verbal Responses: State content correctly Welcome To The [place]: Methods: Explain/Verbal Responses: State content correctly Wound/Skin Impairment: Methods: Explain/Verbal Responses: State content correctly Electronic Signature(s) Signed: 07/08/2016 4:16:44 PM By: Regan Lemming BSN, RN Entered By: Regan Lemming on 07/08/2016 10:44:41 [name] ([id number]) -------------------------------------------------------------------------------- Wound Assessment Details Patient Name: [name] Date of Service: 07/08/2016 10:30 AM Medical Record Number: [id number] Patient Account Number: [id number] Date of Birth/Sex: [birth year] (71 y.o. Female) Treating RN: Afful, RN, BSN, [employer] Primary Care Adriene Knipfer: [name] Other Clinician: Referring Jarquavious Fentress: [name] Treating Marisel Tostenson/Extender: Frann Rider in Treatment: 27 Wound Status Wound Number: 2 Primary Pressure Ulcer Etiology: Wound Location: Left Calcaneus Secondary Diabetic Wound/Ulcer of the Lower Wounding Event: Pressure Injury Etiology: Extremity Date Acquired: 07/15/2015 Wound Open Weeks Of Treatment: 27 Status: Clustered Wound: No Comorbid Cataracts, Type I Diabetes, History: Rheumatoid Arthritis, Osteoarthritis, Neuropathy, Received Chemotherapy Photos Photo Uploaded By: Regan Lemming on 07/08/2016 14:05:19 Wound Measurements Length: (cm) 0.3 Width: (cm) 0.3 Depth: (cm) 0.4 Area: (cm) 0.071 Volume: (cm) 0.028 % Reduction in Area: 69.9% % Reduction in Volume: 70.2% Epithelialization: Large (67-100%) Tunneling: No Undermining: No Wound Description Classification: Category/Stage III Foul Odor A Diabetic Severity (Wagner): Grade 1 Wound Margin: Flat  and Intact Exudate Amount: Small Exudate Type: Serous Exudate Color: amber fter Cleansing: No Wound Bed Granulation Amount: Medium (34-66%) Exposed Structure MORISSA, OBEIRNE ([id number]) Granulation Quality: Pale Fascia Exposed: No Necrotic Amount: None Present (0%) Fat Layer (Subcutaneous Tissue) Exposed: No Tendon Exposed: No Muscle Exposed: No Joint Exposed: No Bone Exposed: No Periwound Skin Texture Texture Color No Abnormalities Noted: No  No Abnormalities Noted: No Callus: No Atrophie Blanche: No Crepitus: No Cyanosis: No Excoriation: No Ecchymosis: No Induration: No Erythema: No Rash: No Hemosiderin Staining: No Scarring: No Mottled: No Pallor: No Moisture Rubor: No No Abnormalities Noted: No Dry / Scaly: No Temperature / Pain Maceration: No Temperature: No Abnormality Tenderness on Palpation: Yes Wound Preparation Ulcer Cleansing: Rinsed/Irrigated with Saline Topical Anesthetic Applied: None Treatment Notes Wound #2 (Left Calcaneus) 1. Cleansed with: Clean wound with Normal Saline 4. Dressing Applied: Iodoform packing Gauze 5. Secondary Dressing Applied Bordered Foam Dressing Dry Gauze Electronic Signature(s) Signed: 07/08/2016 4:16:44 PM By: Regan Lemming BSN, RN Entered By: Regan Lemming on 07/08/2016 10:32:26 [name] ([id number]) -------------------------------------------------------------------------------- Vit[place] Details Patient Name: [name] Date of Service: 07/08/2016 10:30 AM Medical Record Number: [id number] Patient Account Number: [id number] Date of Birth/Sex: 05-29-1945 (71 y.o. Female) Treating RN: Afful, RN, BSN, [place] Primary Care Troy Hartzog: [name] Other Clinician: Referring [place] Oborn: [name] Treating Abcde Oneil/Extender: Frann Rider in Treatment: 27 Vital Signs Time Taken: 10:22 Temperature (F): 97.6 Height (in): 63 Pulse (bpm): 71 Weight (lbs): 130 Respiratory Rate (breaths/min):  16 Body Mass Index (BMI): 23 Blood Pressure (mmHg): 131/45 Reference Range: 80 - 120 mg / dl Electronic Signature(s) Signed: 07/08/2016 4:16:44 PM By: Regan Lemming BSN, RN Entered By: Regan Lemming on 07/08/2016 10:24:58

## 2016-07-14 ENCOUNTER — Inpatient Hospital Stay: Payer: Medicare Other

## 2016-07-14 ENCOUNTER — Other Ambulatory Visit: Payer: Self-pay | Admitting: Hematology and Oncology

## 2016-07-14 DIAGNOSIS — C569 Malignant neoplasm of unspecified ovary: Secondary | ICD-10-CM | POA: Diagnosis not present

## 2016-07-14 DIAGNOSIS — C786 Secondary malignant neoplasm of retroperitoneum and peritoneum: Secondary | ICD-10-CM

## 2016-07-14 DIAGNOSIS — C801 Malignant (primary) neoplasm, unspecified: Secondary | ICD-10-CM

## 2016-07-14 DIAGNOSIS — I82C11 Acute embolism and thrombosis of right internal jugular vein: Secondary | ICD-10-CM

## 2016-07-14 LAB — BASIC METABOLIC PANEL
Anion gap: 5 (ref 5–15)
BUN: 19 mg/dL (ref 6–20)
CO2: 29 mmol/L (ref 22–32)
Calcium: 9 mg/dL (ref 8.9–10.3)
Chloride: 100 mmol/L — ABNORMAL LOW (ref 101–111)
Creatinine, Ser: 0.76 mg/dL (ref 0.44–1.00)
GFR calc Af Amer: >60 mL/min (ref >60)
GFR calc non Af Amer: >60 mL/min (ref >60)
Glucose, Bld: 209 mg/dL — ABNORMAL HIGH (ref 65–99)
Potassium: 3.9 mmol/L (ref 3.5–5.1)
Sodium: 134 mmol/L — ABNORMAL LOW (ref 135–145)

## 2016-07-14 LAB — MAGNESIUM: Magnesium: 1.4 mg/dL — ABNORMAL LOW (ref 1.7–2.4)

## 2016-07-14 MED ORDER — HEPARIN SOD (PORK) LOCK FLUSH 100 UNIT/ML IV SOLN
500.0000 [IU] | Freq: Once | INTRAVENOUS | Status: AC
  Administered 2016-07-14: 500 [IU] via INTRAVENOUS

## 2016-07-14 MED ORDER — SODIUM CHLORIDE 0.9% FLUSH
3.0000 mL | Freq: Once | INTRAVENOUS | Status: DC | PRN
  Filled 2016-07-14: qty 3

## 2016-07-14 MED ORDER — MAGNESIUM SULFATE 4 GM/100ML IV SOLN: 4.0000 g | Freq: Once | INTRAVENOUS | Status: AC

## 2016-07-14 MED ORDER — ALTEPLASE 2 MG IJ SOLR: 2.0000 mg | Freq: Once | INTRAMUSCULAR | Status: DC | PRN

## 2016-07-14 MED ORDER — MAGNESIUM SULFATE 2 GM/50ML IV SOLN
2.0000 g | Freq: Once | INTRAVENOUS | Status: AC
  Administered 2016-07-14: 2 g via INTRAVENOUS
  Filled 2016-07-14: qty 50

## 2016-07-14 MED ORDER — SODIUM CHLORIDE 0.9 % IV SOLN
INTRAVENOUS | Status: DC
  Administered 2016-07-14: 09:00:00 via INTRAVENOUS
  Filled 2016-07-14: qty 1000

## 2016-07-14 MED ORDER — SODIUM CHLORIDE 0.9% FLUSH
10.0000 mL | Freq: Once | INTRAVENOUS | Status: DC | PRN
  Filled 2016-07-14: qty 10

## 2016-07-14 MED ORDER — HEPARIN SOD (PORK) LOCK FLUSH 100 UNIT/ML IV SOLN
250.0000 [IU] | Freq: Once | INTRAVENOUS | Status: DC | PRN
  Filled 2016-07-14: qty 5

## 2016-07-19 ENCOUNTER — Inpatient Hospital Stay: Payer: Medicare Other

## 2016-07-19 ENCOUNTER — Other Ambulatory Visit: Payer: Self-pay | Admitting: Hematology and Oncology

## 2016-07-19 ENCOUNTER — Encounter: Payer: Self-pay | Admitting: Hematology and Oncology

## 2016-07-19 ENCOUNTER — Inpatient Hospital Stay (HOSPITAL_BASED_OUTPATIENT_CLINIC_OR_DEPARTMENT_OTHER): Payer: Medicare Other | Admitting: Hematology and Oncology

## 2016-07-19 ENCOUNTER — Telehealth: Payer: Self-pay | Admitting: Cardiovascular Disease

## 2016-07-19 VITALS — BP 108/62 | HR 72

## 2016-07-19 VITALS — BP 118/69 | HR 79 | Temp 95.0°F | Resp 18 | Wt 142.0 lb

## 2016-07-19 DIAGNOSIS — K449 Diaphragmatic hernia without obstruction or gangrene: Secondary | ICD-10-CM | POA: Diagnosis not present

## 2016-07-19 DIAGNOSIS — I2510 Atherosclerotic heart disease of native coronary artery without angina pectoris: Secondary | ICD-10-CM

## 2016-07-19 DIAGNOSIS — R110 Nausea: Secondary | ICD-10-CM

## 2016-07-19 DIAGNOSIS — Z87891 Personal history of nicotine dependence: Secondary | ICD-10-CM

## 2016-07-19 DIAGNOSIS — Z8673 Personal history of transient ischemic attack (TIA), and cerebral infarction without residual deficits: Secondary | ICD-10-CM

## 2016-07-19 DIAGNOSIS — Z5111 Encounter for antineoplastic chemotherapy: Secondary | ICD-10-CM

## 2016-07-19 DIAGNOSIS — C569 Malignant neoplasm of unspecified ovary: Secondary | ICD-10-CM | POA: Diagnosis not present

## 2016-07-19 DIAGNOSIS — K219 Gastro-esophageal reflux disease without esophagitis: Secondary | ICD-10-CM

## 2016-07-19 DIAGNOSIS — C786 Secondary malignant neoplasm of retroperitoneum and peritoneum: Secondary | ICD-10-CM

## 2016-07-19 DIAGNOSIS — D638 Anemia in other chronic diseases classified elsewhere: Secondary | ICD-10-CM | POA: Diagnosis not present

## 2016-07-19 DIAGNOSIS — Z79899 Other long term (current) drug therapy: Secondary | ICD-10-CM

## 2016-07-19 DIAGNOSIS — I10 Essential (primary) hypertension: Secondary | ICD-10-CM

## 2016-07-19 DIAGNOSIS — Z8701 Personal history of pneumonia (recurrent): Secondary | ICD-10-CM

## 2016-07-19 DIAGNOSIS — Z794 Long term (current) use of insulin: Secondary | ICD-10-CM

## 2016-07-19 DIAGNOSIS — C801 Malignant (primary) neoplasm, unspecified: Secondary | ICD-10-CM

## 2016-07-19 DIAGNOSIS — I5032 Chronic diastolic (congestive) heart failure: Secondary | ICD-10-CM

## 2016-07-19 DIAGNOSIS — E785 Hyperlipidemia, unspecified: Secondary | ICD-10-CM | POA: Diagnosis not present

## 2016-07-19 DIAGNOSIS — I82C11 Acute embolism and thrombosis of right internal jugular vein: Secondary | ICD-10-CM

## 2016-07-19 DIAGNOSIS — I998 Other disorder of circulatory system: Secondary | ICD-10-CM

## 2016-07-19 DIAGNOSIS — K222 Esophageal obstruction: Secondary | ICD-10-CM

## 2016-07-19 DIAGNOSIS — M069 Rheumatoid arthritis, unspecified: Secondary | ICD-10-CM

## 2016-07-19 DIAGNOSIS — R188 Other ascites: Secondary | ICD-10-CM

## 2016-07-19 DIAGNOSIS — E108 Type 1 diabetes mellitus with unspecified complications: Secondary | ICD-10-CM

## 2016-07-19 DIAGNOSIS — Z7689 Persons encountering health services in other specified circumstances: Secondary | ICD-10-CM

## 2016-07-19 DIAGNOSIS — Z9641 Presence of insulin pump (external) (internal): Secondary | ICD-10-CM

## 2016-07-19 DIAGNOSIS — E069 Thyroiditis, unspecified: Secondary | ICD-10-CM

## 2016-07-19 DIAGNOSIS — Z808 Family history of malignant neoplasm of other organs or systems: Secondary | ICD-10-CM

## 2016-07-19 DIAGNOSIS — I252 Old myocardial infarction: Secondary | ICD-10-CM

## 2016-07-19 LAB — CBC WITH DIFFERENTIAL/PLATELET
Basophils Absolute: 0.1 10*3/uL (ref 0–0.1)
Basophils Relative: 2 %
Eosinophils Absolute: 0 10*3/uL (ref 0–0.7)
Eosinophils Relative: 1 %
HCT: 28 % — ABNORMAL LOW (ref 35.0–47.0)
Hemoglobin: 9.9 g/dL — ABNORMAL LOW (ref 12.0–16.0)
Lymphocytes Relative: 22 %
Lymphs Abs: 0.7 10*3/uL — ABNORMAL LOW (ref 1.0–3.6)
MCH: 32.9 pg (ref 26.0–34.0)
MCHC: 35.3 g/dL (ref 32.0–36.0)
MCV: 93.3 fL (ref 80.0–100.0)
Monocytes Absolute: 0.6 10*3/uL (ref 0.2–0.9)
Monocytes Relative: 19 %
Neutro Abs: 1.8 10*3/uL (ref 1.4–6.5)
Neutrophils Relative %: 56 %
Platelets: 201 10*3/uL (ref 150–440)
RBC: 3 MIL/uL — ABNORMAL LOW (ref 3.80–5.20)
RDW: 15.5 % — ABNORMAL HIGH (ref 11.5–14.5)
WBC: 3.2 10*3/uL — ABNORMAL LOW (ref 3.6–11.0)

## 2016-07-19 LAB — COMPREHENSIVE METABOLIC PANEL
ALT: 13 U/L — ABNORMAL LOW (ref 14–54)
AST: 17 U/L (ref 15–41)
Albumin: 3.3 g/dL — ABNORMAL LOW (ref 3.5–5.0)
Alkaline Phosphatase: 56 U/L (ref 38–126)
Anion gap: 6 (ref 5–15)
BUN: 17 mg/dL (ref 6–20)
CO2: 28 mmol/L (ref 22–32)
Calcium: 8.9 mg/dL (ref 8.9–10.3)
Chloride: 100 mmol/L — ABNORMAL LOW (ref 101–111)
Creatinine, Ser: 0.76 mg/dL (ref 0.44–1.00)
GFR calc Af Amer: >60 mL/min (ref >60)
GFR calc non Af Amer: >60 mL/min (ref >60)
Glucose, Bld: 140 mg/dL — ABNORMAL HIGH (ref 65–99)
Potassium: 4 mmol/L (ref 3.5–5.1)
Sodium: 134 mmol/L — ABNORMAL LOW (ref 135–145)
Total Bilirubin: 0.4 mg/dL (ref 0.3–1.2)
Total Protein: 6.8 g/dL (ref 6.5–8.1)

## 2016-07-19 LAB — MAGNESIUM: Magnesium: 1.3 mg/dL — ABNORMAL LOW (ref 1.7–2.4)

## 2016-07-19 MED ORDER — DEXTROSE 5 % IV SOLN: 40.0000 mg/m2 | Freq: Once | INTRAVENOUS | Status: DC

## 2016-07-19 MED ORDER — LOSARTAN POTASSIUM 25 MG PO TABS: 25.0000 mg | ORAL_TABLET | Freq: Every day | ORAL | 6 refills | Status: DC

## 2016-07-19 MED ORDER — MAGNESIUM SULFATE 4 GM/100ML IV SOLN
4.0000 g | Freq: Once | INTRAVENOUS | Status: AC
  Administered 2016-07-19: 4 g via INTRAVENOUS
  Filled 2016-07-19: qty 100

## 2016-07-19 MED ORDER — DEXAMETHASONE SODIUM PHOSPHATE 10 MG/ML IJ SOLN
10.0000 mg | Freq: Once | INTRAMUSCULAR | Status: AC
  Administered 2016-07-19: 10 mg via INTRAVENOUS
  Filled 2016-07-19: qty 1

## 2016-07-19 MED ORDER — SODIUM CHLORIDE 0.9% FLUSH
10.0000 mL | INTRAVENOUS | Status: DC | PRN
  Administered 2016-07-19 (×2): 10 mL via INTRAVENOUS
  Filled 2016-07-19: qty 10

## 2016-07-19 MED ORDER — DEXTROSE 5 % IV SOLN
Freq: Once | INTRAVENOUS | Status: AC
  Administered 2016-07-19: 13:00:00 via INTRAVENOUS
  Filled 2016-07-19: qty 1000

## 2016-07-19 MED ORDER — DOXORUBICIN HCL LIPOSOMAL CHEMO INJECTION 2 MG/ML
70.0000 mg | INJECTION | Freq: Once | INTRAVENOUS | Status: AC
Start: 2016-07-19 — End: 2016-07-19
  Administered 2016-07-19: 70 mg via INTRAVENOUS
  Filled 2016-07-19: qty 25

## 2016-07-19 MED ORDER — SODIUM CHLORIDE 0.9 % IV SOLN: 10.0000 mg | Freq: Once | INTRAVENOUS | Status: DC

## 2016-07-19 MED ORDER — SODIUM CHLORIDE 0.9 % IV SOLN
Freq: Once | INTRAVENOUS | Status: AC
  Administered 2016-07-19: 11:00:00 via INTRAVENOUS
  Filled 2016-07-19: qty 1000

## 2016-07-19 MED ORDER — HEPARIN SOD (PORK) LOCK FLUSH 100 UNIT/ML IV SOLN
500.0000 [IU] | Freq: Once | INTRAVENOUS | Status: AC
  Administered 2016-07-19: 500 [IU] via INTRAVENOUS
  Filled 2016-07-19: qty 5

## 2016-07-19 NOTE — Progress Notes (Signed)
[facility] day:  07/19/2016   Chief Complaint: [name] is a 71 y.o. female with stage IV ovarian cancer who is seen for assessment prior to cycle #2 Doxil.  HPI:  The patient was last seen in the medical oncology clinic on 07/07/2016.  At that time, she was seen for nadir assessment after cycle #1 Doxil.  She tolerated her chemotherapy well.  She noted a little nausea the first week.  She did not require antiemetics.   Counts were good.  Magnesium was 1.5.  She received 2 gm IV magnesium.  Labs on 07/14/2016 revealed a magnesium of 1.4.  She received 4 gm IV magnesium.  During the interim, she has done well.  She denies any complaint.  She notes that she takes 3 blood pressure medications (metoprolol, losartan, and Lasix).  She is followed by Dr. Ida Rogue, cardiologist.   Past Medical History:  Diagnosis Date  . CAD (coronary artery disease)    a. 09/2012 Cath: LM nl, LAD 95p, 71m LCX 979mOM2 50, RCA 100.  . Marland Kitchenholelithiasis   . Chronic diastolic CHF (congestive heart failure) ([facility]  . Collagen vascular disease ([facility]  . Esophageal stricture   . Exertional shortness of breath   . GERD (gastroesophageal reflux disease)   . H/O hiatal hernia   . Herniated disc   . History of pancytopenia   . Hyperlipidemia   . Hypertension   . Hypokalemia   . Leukopenia 2012   s/p bone marrow biopsy, Dr. PaMa Hillock. NSTEMI (non-ST elevated myocardial infarction) ([facility]5/2014   "mild" (10/18/2012)  . Ovarian cancer ([facility]2016   chemo  . Pneumonia 2013; 08/2012   "one lung; double" (10/18/2012)  . Rheumatoid arthritis(714.0)   . Type I diabetes mellitus ([place]   "dx'd in 1957" (10/18/2012)    Past Surgical History:  Procedure Laterality Date  . ABDOMINAL HYSTERECTOMY  06/15/2015   Dx L/S, EXLAP TAH BSO omentectomy RSRx colostomy diaphragm resection stripping  . CARDIAC CATHETERIZATION  10/18/2012   "first one was today" (10/18/2012)  .  CATARACT EXTRACTION W/ INTRAOCULAR LENS IMPLANT Right 2010  . CHOLECYSTECTOMY  06/15/2015   combined case with ovarian cancer debulking  . COLON SURGERY    . CORONARY ARTERY BYPASS GRAFT N/A 10/19/2012   Procedure: CORONARY ARTERY BYPASS GRAFTING (CABG);  Surgeon: StMelrose NakayamaMD;  [facility] Service: Open Heart Surgery;  Laterality: N/A;  . ESOPHAGEAL DILATION     "3 or 4 times" (10/19/2011)  . ESOPHAGOGASTRODUODENOSCOPY  2012   Dr. IfMinna Merritts. OSTOMY    . OVARY SURGERY     removal  . PERIPHERAL VASCULAR CATHETERIZATION N/A 03/02/2015   Procedure: PoGlori Luisath Insertion;  Surgeon: JaAlgernon HuxleyMD;  [facility];  Service: Cardiovascular;  Laterality: N/A;  . TUBAL LIGATION  1970    Family History  Problem Relation Age of Onset  . Diabetes Mother   . Arthritis Mother   . Diabetes Father   . Arthritis Father   . Bone cancer Sister     Social History:  reports that she quit smoking about 22 years ago. Her smoking use included Cigarettes. She has a 30.00 pack-year smoking history. She has never used smokeless tobacco. She reports that she does not drink alcohol or use drugs.  She is accompanied by her husband today.  Allergies:  Allergies  Allergen Reactions  . Codeine Nausea  And Vomiting  . Latex Rash    Current Medications: Current Outpatient Prescriptions  Medication Sig Dispense Refill  . Calcium-Vitamin D 600-200 MG-UNIT tablet Take 2 tablets by mouth daily.     Marland Kitchen enoxaparin (LOVENOX) 60 MG/0.6ML injection Inject 0.6 mLs (60 mg total) into the skin every 12 (twelve) hours. 60 Syringe 1  . folic acid (FOLVITE) 1 MG tablet TAKE 1 TABLET BY MOUTH ONCE A DAY 90 tablet 3  . insulin lispro (HUMALOG) 100 UNIT/ML injection Inject 0.06 mLs (6 Units total) into the skin daily. (Patient taking differently: Inject 0-85 Units into the skin daily. Via insulin pump) 30 mL 3  . lidocaine-prilocaine (EMLA) cream APPLY 1 APPLICATION TOPICALLY AS NEEDED 1 HOUR  PRIOR TO TREATMENT. COVER WITH PRESS N SEAL UNTIL TREATMENT TIME AS DIRECTED 30 g 1  . loratadine (CLARITIN) 10 MG tablet Take 1 tablet by mouth daily as needed.    Marland Kitchen losartan (COZAAR) 100 MG tablet Take 1 tablet (100 mg total) by mouth daily. 90 tablet 1  . magnesium oxide (MAG-OX) 400 (241.3 Mg) MG tablet TAKE 1 TABLET BY MOUTH ONCE A DAY 30 tablet 0  . metoprolol tartrate (LOPRESSOR) 25 MG tablet Take 1 tablet (25 mg total) by mouth 2 (two) times daily. 180 tablet 3  . ondansetron (ZOFRAN) 8 MG tablet TAKE 1 TABLET BY MOUTH EVERY 8 HOURS AS NEEDED FOR NAUSEA OR VOMITING 30 tablet 1  . ONE TOUCH ULTRA TEST test strip     . pantoprazole (PROTONIX) 40 MG tablet TAKE 1 TABLET BY MOUTH TWICE A DAY 180 tablet 1  . potassium chloride SA (K-DUR,KLOR-CON) 20 MEQ tablet Take 1 tablet (20 mEq total) by mouth daily. 30 tablet 0  . predniSONE (DELTASONE) 5 MG tablet Take 5 mg by mouth daily with breakfast.    . ranitidine (ZANTAC) 150 MG tablet Take 1 tablet (150 mg total) by mouth 2 (two) times daily. 180 tablet 1  . vitamin C (ASCORBIC ACID) 500 MG tablet Take 500 mg by mouth daily.    Marland Kitchen zinc gluconate 50 MG tablet Take 50 mg by mouth daily.     Current Facility-Administered Medications  Medication Dose Route Frequency Provider Last Rate Last Dose  . Tbo-Filgrastim (GRANIX) injection 300 mcg  300 mcg Subcutaneous Once Lequita Asal, MD       Facility-Administered Medications Ordered in Other Visits  Medication Dose Route Frequency Provider Last Rate Last Dose  . heparin lock flush 100 unit/mL  500 Units Intravenous Once Lequita Asal, MD      . sodium chloride 0.9 % injection 10 mL  10 mL Intracatheter PRN Lequita Asal, MD      . sodium chloride 0.9 % injection 10 mL  10 mL Intravenous PRN Lequita Asal, MD   10 mL at 04/13/15 0852  . sodium chloride flush (NS) 0.9 % injection 10 mL  10 mL Intravenous PRN Lequita Asal, MD   10 mL at 07/19/16 0915    Review of Systems:   GENERAL:  Feels good.  No fever, chills or sweats.  Weight up 2 pounds. PERFORMANCE STATUS (ECOG):  1 HEENT:  No visual changes, sore throat, mouth sores or tenderness. Lungs:  No shortness of breath or cough.  No hemoptysis. Cardiac:  No chest pain, palpitations, orthopnea, or PND. GI:  No abdominal pain or distention.  No nausea, vomiting, diarrhea, melena or hematochezia. GU:  No urgency, frequency, dysuria, or hematuria. Musculoskeletal:  Severe rheumatoid arthritis (  off MTX and on prednisone).  Osteoporosis.  No back pain. No muscle tenderness. Extremities:  No pain or swelling. Skin:  No increased bruising or bleeding.  No rashes or skin changes. Neuro:  Neuropathy (stable).  No headache, numbness or weakness, balance or coordination issues. Endocrine:  Diabetes on an insulin pump.  Occasional hot flashes.  Psych:  No mood changes, depression or anxiety. Pain: No pain. Review of systems:  All other systems reviewed and found to be negative.  Physical Exam: Blood pressure (!) 97/57, pulse 80, temperature (!) 95 F (35 C), temperature source Tympanic, resp. rate 18, weight 142 lb (64.4 kg). GENERAL:  Well developed, well nourished woman sitting comfortably in the exam room in no acute distress. MENTAL STATUS:  Alert and oriented to person, place and time. HEAD:  Curly gray hair.  Normocephalic, atraumatic, face symmetric, no Cushingoid features. EYES:  Gold rimmed glasses.  Blue eyes.  No conjunctivitis or scleral icterus.  ENT: Oropharynx clear without lesion. Tongue normal. Mucous membranes moist.  RESPIRATORY: Clear to auscultation without rales, wheezes or rhonchi. CARDIOVASCULAR: Regular rate and rhythm without murmur, rub or gallop. ABDOMEN: Soft, non-tender with active bowel sounds and no hepatosplenomegaly. No palpable nodularity or masses.  No shifting dullness.  Insulin pump.  Ostomy bag. SKIN: Bruising on abdomen s/p Lovenox injections. No rashes or  ulcers. EXTREMITIES:  No edema, skin discoloration or tenderness. No palpable cords. LYMPH NODES: No palpable cervical, supraclavicular, axillary or inguinal adenopathy  NEUROLOGICAL: Appropriate. PSYCH: Appropriate.   Radiology studies: 02/13/2015:  Abdomen and pelvic CT revealed bilateral mass-like adnexal regions (right adnexal mass 5.6 x 5.0 cm and the left adnexa mass 10.3 x 6.0 x 9.9 cm).  There was a large amount of soft tissue throughout the peritoneal cavity involving the omentum and other peritoneal surfaces.  There was a small volume ascites. There was a peripheral 3.3 x 1.9 cm low-attenuation lesion overlying the right lobe of the liver, likely representing a serosal implant. There was 1.4 x 2.2 cm ill-defined peripheral lesion within the inferior aspect of segment 6 adjacent to the ampulla in the duodenum.  04/28/2015:  Abdominal and pelvic CT revealed decreasing bilateral ovarian masses.   The left adnexal mass measured 4.0 x 6.1 cm (previously 5.0 x 8.5 cm). The right ovary measured 3.8 x 4.9 cm (previously 4.7 x 5.6 cm).  There was improved peritoneal carcinomatosis.  There was a small amount of ascites.  The hepatic dome lesion was stable. The previously seen right hepatic lobe lesion was not well-visualized.  There was a small left pleural effusion and trace right pleural effusion.  The nodular lesion at the ampulla of Vater, extending into the duodenum was stable.  There was no biliary ductal dilatation. 08/01/2015:  Rght upper extremity ultrasound revealed a near occlusive thrombus within the central portion of the right internal jugular vein and central portion of the right subclavian vein. 09/01/2015:  Chest, abdomen, and pelvic CT revealed continued decrease in perihepatic fluid collection contiguous with the right pleural space with percutaneous drain.  09/11/2015:  Chest, abdomen, and pelvic CT revealed a residual versus recurrent fluid collection posterior to the right  hepatic lobe (2.6 x 1.1 x 4.0 cm).   10/13/2015:  Chest, abdomen, and pelvic CT revealed resolution of the empyema. 02/15/2016:  Chest, abdomen, and pelvic CT revealed multiple soft tissue nodules throughout the pelvis, small bowel mesenteric and peritoneum and, concerning for peritoneal metastatic disease.  There was soft tissue irregularity within the left upper  quadrant.  There was mild right hydronephrosis (etiology unclear).  There was interval resolution of previously described right pleural based fluid and gas collection. There was a pleural based nodule within the right lower hemi-thorax concerning for pleural based metastasis.  There were small bilateral pleural effusions.  There was pulmonary nodularity, predominately within the left upper lobe (metastatic or infectious/inflammatory etiology).  There was slightly increased mediastinal adenopathy (infectious/inflammatory or metastatic).  There was a low attenuation lesion within the left hepatic lobe (complicated fluid within the fissure or metastatic disease). 03/08/2016:  Abdomen and pelvic CT revealed mild progression of peritoneal disease in the abdomen.  There was interval increase in loculated fluid around the lateral segment left liver, stomach, and spleen.  There was persistent soft tissue lesion at the level of the ampulla with mild intra and extrahepatic biliary duct dilatation.  There was persistent intrahepatic and capsular metastatic disease involving the liver. 05/26/2016:  Head MRI revealed multiple small areas of acute infarct involving the occipital parietal lobe bilaterally and the left lateral cerebellum,  consistent with posterior circulation emboli.  06/10/2016:  Adomen and pelvic CT revealed progression of peritoneal carcinomatosis predominantly in the perihepatic space, bilateral lower quadrants and pelvis.  There was worsening bilateral obstructive uropathy due to malignant involvement of the pelvic ureters.  There was stable  small pleural/subpleural nodules at the right lung base.  There was stable small left pleural effusion.  There was decreased small volume perihepatic ascites.    Infusion on 07/19/2016  Component Date Value Ref Range Status  . WBC 07/19/2016 3.2* 3.6 - 11.0 K/uL Final  . RBC 07/19/2016 3.00* 3.80 - 5.20 MIL/uL Final  . Hemoglobin 07/19/2016 9.9* 12.0 - 16.0 g/dL Final  . HCT 07/19/2016 28.0* 35.0 - 47.0 % Final  . MCV 07/19/2016 93.3  80.0 - 100.0 fL Final  . MCH 07/19/2016 32.9  26.0 - 34.0 pg Final  . MCHC 07/19/2016 35.3  32.0 - 36.0 g/dL Final  . RDW 07/19/2016 15.5* 11.5 - 14.5 % Final  . Platelets 07/19/2016 201  150 - 440 K/uL Final  . Neutrophils Relative % 07/19/2016 56  % Final  . Neutro Abs 07/19/2016 1.8  1.4 - 6.5 K/uL Final  . Lymphocytes Relative 07/19/2016 22  % Final  . Lymphs Abs 07/19/2016 0.7* 1.0 - 3.6 K/uL Final  . Monocytes Relative 07/19/2016 19  % Final  . Monocytes Absolute 07/19/2016 0.6  0.2 - 0.9 K/uL Final  . Eosinophils Relative 07/19/2016 1  % Final  . Eosinophils Absolute 07/19/2016 0.0  0 - 0.7 K/uL Final  . Basophils Relative 07/19/2016 2  % Final  . Basophils Absolute 07/19/2016 0.1  0 - 0.1 K/uL Final  . Sodium 07/19/2016 134* 135 - 145 mmol/L Final  . Potassium 07/19/2016 4.0  3.5 - 5.1 mmol/L Final  . Chloride 07/19/2016 100* 101 - 111 mmol/L Final  . CO2 07/19/2016 28  22 - 32 mmol/L Final  . Glucose, Bld 07/19/2016 140* 65 - 99 mg/dL Final  . BUN 07/19/2016 17  6 - 20 mg/dL Final  . Creatinine, Ser 07/19/2016 0.76  0.44 - 1.00 mg/dL Final  . Calcium 07/19/2016 8.9  8.9 - 10.3 mg/dL Final  . Total Protein 07/19/2016 6.8  6.5 - 8.1 g/dL Final  . Albumin 07/19/2016 3.3* 3.5 - 5.0 g/dL Final  . AST 07/19/2016 17  15 - 41 U/L Final  . ALT 07/19/2016 13* 14 - 54 U/L Final  . Alkaline Phosphatase 07/19/2016 56  38 - 126 U/L Final  . Total Bilirubin 07/19/2016 0.4  0.3 - 1.2 mg/dL Final  . GFR calc non Af Amer 07/19/2016 >60  >60 mL/min Final  .  GFR calc Af Amer 07/19/2016 >60  >60 mL/min Final   Comment: (NOTE) The eGFR has been calculated using the CKD EPI equation. This calculation has not been validated in all clinical situations. eGFR's persistently <60 mL/min signify possible Chronic Kidney Disease.   . Anion gap 07/19/2016 6  5 - 15 Final  . Magnesium 07/19/2016 1.3* 1.7 - 2.4 mg/dL Final    Assessment:  [name] is a 71 y.o. female with stage IV ovarian cancer.  She presented with abdominal discomfort and bloating.  Omental biopsy on 02/23/2015 revealed metastatic high grade serous carcinoma, consistent with gynecologic origin.   She was initially diagnosed with clinical stage IIIC (T3cN1Mx).  CA125 was 707 on 02/17/2015.  Abdomen and pelvic CT scan on 02/13/2015 revealed bilateral mass-like adnexal regions (right adnexal mass 5.6 x 5.0 cm and the left adnexa mass 10.3 x 6.0 x 9.9 cm).  There was a large amount of soft tissue throughout the peritoneal cavity involving the omentum and other peritoneal surfaces.  There was a small volume ascites. There was a peripheral 3.3 x 1.9 cm low-attenuation lesion overlying the right lobe of the liver, likely representing a serosal implant. There was 1.4 x 2.2 cm ill-defined peripheral lesion within the inferior aspect of segment 6 adjacent to the ampulla in the duodenum.   She received 4 cycles of neoadjuvant carboplatin and Taxol (03/05/2015 - 05/22/2015).  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  She had loose stools on oral magnesium.  She was initially on Neurontin then switched Lyrica with cycle #3.  Cycle #4 was notable for neutropenia (ANC 300) requiring GCSF x 3 days.    She received tamoxifen from 02/25/2016 - 03/14/2016.  She received 4 cycles of carboplatin and gemcitabine (03/21/2016 - 05/24/2016) with GCSF/Neulasta support.  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  CA125 was 802.9 on 03/30/2015, 567.9 on 04/13/2015, 168.8 on 05/15/2015, 85.2  on 07/17/2015, 68.6 on 07/28/2015, 34.5 on 09/17/2015, 20.7 on 11/06/2015, 49 on 01/22/2016, 106.1 on 02/22/2016, 138.8 on 03/07/2016, 220.8 on 03/21/2016, 152.8 on 04/11/2016, 125.6 on 04/18/2016, 76.8 on 05/03/2016, 60.2 on 05/24/2016, and 44 on 07/19/2016.  She underwent exploratory laparotomy, lysis of adhesions, total abdominal hysterectomy with bilateral salpingo-oophorectomy, infracolic omentectomy, optimal tumor debulking(< 1 cm), recto-sigmoid resection with creation of end colostomy, cholecystectomy, mobilization of splenic flexure and liver with diaphragmatic stripping on 06/15/2015. The right diaphragm was cleared of tumor. During dissection, the diaphragm was entered and closed with sutures.   She had a recurrent right sided pleural effusion.  She underwent thoracentesis of 650 cc on post-operative day 3.  She was admitted to [facility] on 07/01/2015 and 07/07/2015 for recurrent shortness of breath.  She underwent 2 additional thoracenteses (1.1 L on 07/02/2015 and 850 cc on 07/08/2015).  Cytology was negative x 2.  Bilateral lower extremity duplex on 07/03/2015 was negative.  Echo revealed an EF of 55-60% on 07/08/2015 and 60-65% on 05/27/2016.    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 She was on Lovenox 60 mg twice a day.  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  She was  admitted to [facility] from 07/29/2015 - 08/10/2015 with a right-sided empyema and liver abscess. She underwent CT-guided placement of a liver abscess drain on 07/30/2015.  Liver abscess culture grew out group B strep and Enterobacter which was sensitive to Zosyn. She was transitioned to ertapenem Colbert Ewing) prior to discharge.  She was readmitted to [facility] on 09/17/2015.  She was admitted to [facility] from 05/26/2016 - 05/28/2016 with altered  mental status and an acute embolic CVA.  Head MRI on 05/26/2016 revealed multiple small areas of acute infarct involving the occipital parietal lobe bilaterally and the left lateral cerebellum,  consistent with posterior circulation emboli.  Work-up included a negative carotid ultrasound and echocardiogram.  She has severe rheumatoid arthritis.  Methotrexate and Enbrel were initially on hold.  She has a normocytic anemia.  Work-up on 02/17/2015 and 05/24/2016 revealed a normal ferritin, B12, folate, TSH.  She denies any melena or hematochezia.    She has anemia due to chronic disease. She received 1 unit PRBCs during her admission at [facility]. She denies any melena or hematochezia. She has diabetes and is on an insulin pump.  She has had persistent neutropenia felt secondary to her rheumatoid arthritis.  Folate and MMA were normal.  TSH was 6.13 (high) with a free T4 of 1.17 (0.61-1.12).  She began methotrexate (10 mg a week) and prednisone (5 mg a day) for severe rheumatoid arthritis on 12/17/2015.  She remains on prednisone alone (5 mg a day).  Bone marrow aspirate and biopsy on 06/09/2010 revealed a hypercellular marrow (70%) with no evidence of dysplasia or malignancy.  Flow cytometry was negative.  Cytogenetics were normal (46,XX).  FISH studies were negative for MDS.  Bone marrow aspirate and biopsy on 12/03/2015 revealed a normocellular to mildy hypercellular marrow for age (40%) with left shifted myelopoiesis, non specific dyserythropoiesis and mild megakaryocytic atypia with no increase in blasts.  There were multiple small nonspecific lymphoid aggregates (favor reactive). There was no increase in reticulin.  There was decreased myeloid cells (37%) with left shifted maturation and 1% atypical myelod blasts.  There was relatively increased monocytic cells (11%), relatively increased lymphoid cells (36%), and relatively increased eosinophils (6%).  Cytogenetics were normal (33, XX).  SNP microarray was  normal.  Adomen and pelvic CT scan on 06/10/2016 revealed progression of peritoneal carcinomatosis predominantly in the perihepatic space, bilateral lower quadrants and pelvis.  There was worsening bilateral obstructive uropathy due to malignant involvement of the pelvic ureters.  There was stable small pleural/subpleural nodules at the right lung base.  There was stable small left pleural effusion.  There was decreased small volume perihepatic ascites.   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  She receives IV magnesium weekly.  She is s/p cycle #1 Doxil (06/21/2016) with Neulasta support.  She tolerated her chemotherapy well.  Symptomatically, she feels good.  Blood pressure is slightly low on 3 blood pressure medications.  Counts are good.  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  Plan: 1.  Labs today:  CBC with diff, BMP, Mg, CA125. 2.  Cycle #2 Doxil today. 3.  RTC tomorrow for Neulasta. 4.  Magnesium sulfate 4 gm IV today. 5.  Continue Lovenox. 6.  Check orthostatics.  Phone follow-up with Dr. Rockey Situ today re: blood pressure. 7.  RTC weekly for labs (BMP, Mg) +/- IV Mg 8.  RTC in 4 weeks for MD assessment, labs (CBC with diff, CMP, Mg, CA125), and cycle #3 Doxil.   Lequita Asal, MD  07/19/2016, 10:02 AM

## 2016-07-19 NOTE — Telephone Encounter (Signed)
Left message for pt that Dr. Rockey Situ spoke w/ Dr. Mike Gip regarding her low BP in her office and that Dr. Rockey Situ recommends that she decrease her losartan from 100 mg daily to 25 mg daily. If have sent in new rx to [place]. Asked her to call back w/ any questions or concerns.

## 2016-07-19 NOTE — Progress Notes (Signed)
Patient offers no complaints today. However BP is low.  Sitting 97/57 HR 80.  Standing 90/52 HR 83.  Patient took her BP meds this morning.  States she does not drink a lot of water. Asymptomatic.

## 2016-07-20 ENCOUNTER — Inpatient Hospital Stay: Payer: Medicare Other

## 2016-07-20 ENCOUNTER — Telehealth: Payer: Self-pay | Admitting: *Deleted

## 2016-07-20 DIAGNOSIS — C569 Malignant neoplasm of unspecified ovary: Secondary | ICD-10-CM | POA: Diagnosis not present

## 2016-07-20 LAB — CA 125: CA 125: 44 U/mL — ABNORMAL HIGH (ref 0.0–38.1)

## 2016-07-20 MED ORDER — PEGFILGRASTIM INJECTION 6 MG/0.6ML ~~LOC~~
6.0000 mg | PREFILLED_SYRINGE | Freq: Once | SUBCUTANEOUS | Status: AC
  Administered 2016-07-20: 6 mg via SUBCUTANEOUS
  Filled 2016-07-20: qty 0.6

## 2016-07-20 NOTE — Telephone Encounter (Signed)
Called patient and informed her of the ca125 decrease from 60.2 to 44.0 voiced understanding and was excited with the improvement.

## 2016-07-22 ENCOUNTER — Other Ambulatory Visit: Payer: Self-pay | Admitting: Hematology and Oncology

## 2016-07-22 DIAGNOSIS — I639 Cerebral infarction, unspecified: Secondary | ICD-10-CM

## 2016-07-22 DIAGNOSIS — C786 Secondary malignant neoplasm of retroperitoneum and peritoneum: Secondary | ICD-10-CM

## 2016-07-22 DIAGNOSIS — C801 Malignant (primary) neoplasm, unspecified: Secondary | ICD-10-CM

## 2016-07-22 DIAGNOSIS — C569 Malignant neoplasm of unspecified ovary: Secondary | ICD-10-CM

## 2016-07-22 DIAGNOSIS — I82C11 Acute embolism and thrombosis of right internal jugular vein: Secondary | ICD-10-CM

## 2016-07-26 ENCOUNTER — Encounter: Payer: Self-pay | Admitting: Family Medicine

## 2016-07-26 ENCOUNTER — Inpatient Hospital Stay: Payer: Medicare Other

## 2016-07-26 ENCOUNTER — Ambulatory Visit (INDEPENDENT_AMBULATORY_CARE_PROVIDER_SITE_OTHER): Payer: Medicare Other | Admitting: Family Medicine

## 2016-07-26 DIAGNOSIS — I10 Essential (primary) hypertension: Secondary | ICD-10-CM

## 2016-07-26 DIAGNOSIS — E1059 Type 1 diabetes mellitus with other circulatory complications: Secondary | ICD-10-CM

## 2016-07-26 DIAGNOSIS — C569 Malignant neoplasm of unspecified ovary: Secondary | ICD-10-CM

## 2016-07-26 DIAGNOSIS — C801 Malignant (primary) neoplasm, unspecified: Principal | ICD-10-CM

## 2016-07-26 DIAGNOSIS — C786 Secondary malignant neoplasm of retroperitoneum and peritoneum: Secondary | ICD-10-CM

## 2016-07-26 DIAGNOSIS — D509 Iron deficiency anemia, unspecified: Secondary | ICD-10-CM

## 2016-07-26 LAB — BASIC METABOLIC PANEL
Anion gap: 6 (ref 5–15)
BUN: 26 mg/dL — ABNORMAL HIGH (ref 6–20)
CO2: 32 mmol/L (ref 22–32)
Calcium: 9.2 mg/dL (ref 8.9–10.3)
Chloride: 94 mmol/L — ABNORMAL LOW (ref 101–111)
Creatinine, Ser: 0.81 mg/dL (ref 0.44–1.00)
GFR calc Af Amer: >60 mL/min (ref >60)
GFR calc non Af Amer: >60 mL/min (ref >60)
Glucose, Bld: 277 mg/dL — ABNORMAL HIGH (ref 65–99)
Potassium: 4.2 mmol/L (ref 3.5–5.1)
Sodium: 132 mmol/L — ABNORMAL LOW (ref 135–145)

## 2016-07-26 LAB — MAGNESIUM: Magnesium: 1.5 mg/dL — ABNORMAL LOW (ref 1.7–2.4)

## 2016-07-26 MED ORDER — MAGNESIUM SULFATE 4 GM/100ML IV SOLN
4.0000 g | Freq: Once | INTRAVENOUS | Status: AC
  Administered 2016-07-26: 4 g via INTRAVENOUS
  Filled 2016-07-26: qty 100

## 2016-07-26 MED ORDER — HEPARIN SOD (PORK) LOCK FLUSH 100 UNIT/ML IV SOLN
500.0000 [IU] | Freq: Once | INTRAVENOUS | Status: AC
  Administered 2016-07-26: 500 [IU] via INTRAVENOUS
  Filled 2016-07-26: qty 5

## 2016-07-26 MED ORDER — SODIUM CHLORIDE 0.9 % IV SOLN
Freq: Once | INTRAVENOUS | Status: AC
  Administered 2016-07-26: 12:00:00 via INTRAVENOUS
  Filled 2016-07-26: qty 1000

## 2016-07-26 MED ORDER — SODIUM CHLORIDE 0.9% FLUSH
10.0000 mL | Freq: Once | INTRAVENOUS | Status: AC | PRN
  Administered 2016-07-26: 10 mL
  Filled 2016-07-26: qty 10

## 2016-07-26 NOTE — Patient Instructions (Signed)
Nice to see you. Please continue to follow with oncology and your endocrinologist. Your blood pressure is well-controlled. Monitor for any further lightheadedness. If this occurs please let us know.

## 2016-07-26 NOTE — Progress Notes (Signed)
  Tommi Rumps, MD Phone: [phone]  [name] is a 71 y.o. female who presents today for [place].  Diabetes: Patient has type 1 diabetes. Last A1c 7.2 per her report. She is on an insulin pump. No polyuria or polydipsia. Has had a couple of episodes of hypoglycemia and she drinks juice and this helps. She saw ophthalmology about 6 weeks ago. She follows with endocrinology.  Patient has history of ovarian cancer and peritoneal carcinomatosis. No abdominal pain, weight changes, or sweats. Sees oncology fairly frequently and is currently on chemotherapy. Also has had some low magnesium for which she's been supplemented. Also on prednisone.  Anemia: Hemoglobin has been relatively stable on chemotherapy. She notes no chest pain. Notes her breathing is stable and unchanged. Does note she gets a little short winded following her chemotherapy if she moves around too much and this is been present since she started on chemotherapy.  PMH: Former smoker.   ROS see history of present illness  Objective  Physical Exam Vitals:   07/26/16 0957  BP: 132/60  Pulse: 82  Temp: 98.6 F (37 C)    BP Readings from Last 3 Encounters:  07/26/16 132/60  07/19/16 108/62  07/19/16 118/69   Wt Readings from Last 3 Encounters:  07/26/16 140 lb 9.6 oz (63.8 kg)  07/19/16 142 lb (64.4 kg)  07/07/16 140 lb 14 oz (63.9 kg)    Physical Exam  Constitutional: No distress.  Cardiovascular: Normal rate, regular rhythm and normal heart sounds.   Pulmonary/Chest: Effort normal and breath sounds normal.  Abdominal: Soft. Bowel sounds are normal. She exhibits no distension. There is no tenderness. There is no rebound and no guarding.  Musculoskeletal: She exhibits no edema.  Neurological: She is alert. Gait normal.  Skin: Skin is warm and dry. She is not diaphoretic.     Assessment/Plan: Please see individual problem list.  Hypertension At goal. Lightheadedness is improved with decreasing her  losartan dose. Continue to monitor.  Diabetes type 1, controlled [facility]) Following with endocrinology. Currently on an insulin pump. She'll continue to monitor her sugars. Continue to follow with endocrinology.  Malignant neoplasm of ovary (HCC) Currently on chemotherapy and following with oncology. They recently changed her chemotherapy regimen. She'll continue to follow with oncology.  Hypomagnesemia Has been low recently. Receiving supplementation through oncology. Continue to follow.  Anemia, iron deficiency Has been relatively stable. Having periodic CBCs at the [place] given that she is on chemotherapy. They will continue to monitor.    Tommi Rumps, MD [place]

## 2016-07-26 NOTE — Assessment & Plan Note (Signed)
Has been relatively stable. Having periodic CBCs at the [place] [facility] given that she is on chemotherapy. They will continue to monitor.

## 2016-07-26 NOTE — Assessment & Plan Note (Signed)
Currently on chemotherapy and following with oncology. They recently changed her chemotherapy regimen. She'll continue to follow with oncology.

## 2016-07-26 NOTE — Assessment & Plan Note (Signed)
Has been low recently. Receiving supplementation through oncology. Continue to follow.

## 2016-07-26 NOTE — Progress Notes (Signed)
Magnesium: 1.5 today. MD, Dr. Mike Gip, notified via telephone and aware. Per MD order: follow supportive therapy plan; patient is to receive Magnesium Sulfate 4g IVPB once today.

## 2016-07-26 NOTE — Assessment & Plan Note (Signed)
Following with endocrinology. Currently on an insulin pump. She'll continue to monitor her sugars. Continue to follow with endocrinology.

## 2016-07-26 NOTE — Progress Notes (Signed)
Pre visit review using our clinic review tool, if applicable. No additional management support is needed unless otherwise documented below in the visit note. 

## 2016-07-26 NOTE — Assessment & Plan Note (Signed)
At goal. Lightheadedness is improved with decreasing her losartan dose. Continue to monitor.

## 2016-08-02 ENCOUNTER — Inpatient Hospital Stay: Payer: Medicare Other

## 2016-08-02 ENCOUNTER — Other Ambulatory Visit: Payer: Self-pay | Admitting: Cardiovascular Disease

## 2016-08-02 MED ORDER — LOSARTAN POTASSIUM 25 MG PO TABS: 25.0000 mg | ORAL_TABLET | Freq: Every day | ORAL | 2 refills | Status: DC

## 2016-08-03 ENCOUNTER — Other Ambulatory Visit: Payer: Self-pay

## 2016-08-03 ENCOUNTER — Inpatient Hospital Stay: Payer: Medicare Other | Attending: Hematology and Oncology

## 2016-08-03 ENCOUNTER — Inpatient Hospital Stay: Payer: Medicare Other

## 2016-08-03 ENCOUNTER — Other Ambulatory Visit: Payer: Self-pay | Admitting: Hematology and Oncology

## 2016-08-03 DIAGNOSIS — M069 Rheumatoid arthritis, unspecified: Secondary | ICD-10-CM | POA: Diagnosis not present

## 2016-08-03 DIAGNOSIS — C786 Secondary malignant neoplasm of retroperitoneum and peritoneum: Secondary | ICD-10-CM | POA: Insufficient documentation

## 2016-08-03 DIAGNOSIS — Z7689 Persons encountering health services in other specified circumstances: Secondary | ICD-10-CM | POA: Diagnosis not present

## 2016-08-03 DIAGNOSIS — I5032 Chronic diastolic (congestive) heart failure: Secondary | ICD-10-CM | POA: Diagnosis not present

## 2016-08-03 DIAGNOSIS — Z5111 Encounter for antineoplastic chemotherapy: Secondary | ICD-10-CM | POA: Insufficient documentation

## 2016-08-03 DIAGNOSIS — Z79899 Other long term (current) drug therapy: Secondary | ICD-10-CM | POA: Diagnosis not present

## 2016-08-03 DIAGNOSIS — I2510 Atherosclerotic heart disease of native coronary artery without angina pectoris: Secondary | ICD-10-CM | POA: Insufficient documentation

## 2016-08-03 DIAGNOSIS — E785 Hyperlipidemia, unspecified: Secondary | ICD-10-CM | POA: Insufficient documentation

## 2016-08-03 DIAGNOSIS — I10 Essential (primary) hypertension: Secondary | ICD-10-CM | POA: Insufficient documentation

## 2016-08-03 DIAGNOSIS — Z794 Long term (current) use of insulin: Secondary | ICD-10-CM | POA: Insufficient documentation

## 2016-08-03 DIAGNOSIS — K222 Esophageal obstruction: Secondary | ICD-10-CM | POA: Insufficient documentation

## 2016-08-03 DIAGNOSIS — Z7901 Long term (current) use of anticoagulants: Secondary | ICD-10-CM | POA: Diagnosis not present

## 2016-08-03 DIAGNOSIS — Z90722 Acquired absence of ovaries, bilateral: Secondary | ICD-10-CM | POA: Insufficient documentation

## 2016-08-03 DIAGNOSIS — C7800 Secondary malignant neoplasm of unspecified lung: Secondary | ICD-10-CM | POA: Diagnosis not present

## 2016-08-03 DIAGNOSIS — K449 Diaphragmatic hernia without obstruction or gangrene: Secondary | ICD-10-CM | POA: Diagnosis not present

## 2016-08-03 DIAGNOSIS — I252 Old myocardial infarction: Secondary | ICD-10-CM | POA: Insufficient documentation

## 2016-08-03 DIAGNOSIS — Z90710 Acquired absence of both cervix and uterus: Secondary | ICD-10-CM | POA: Insufficient documentation

## 2016-08-03 DIAGNOSIS — D61818 Other pancytopenia: Secondary | ICD-10-CM | POA: Diagnosis not present

## 2016-08-03 DIAGNOSIS — C801 Malignant (primary) neoplasm, unspecified: Secondary | ICD-10-CM

## 2016-08-03 DIAGNOSIS — Z8701 Personal history of pneumonia (recurrent): Secondary | ICD-10-CM | POA: Diagnosis not present

## 2016-08-03 DIAGNOSIS — C569 Malignant neoplasm of unspecified ovary: Secondary | ICD-10-CM | POA: Diagnosis not present

## 2016-08-03 DIAGNOSIS — E876 Hypokalemia: Secondary | ICD-10-CM | POA: Diagnosis not present

## 2016-08-03 DIAGNOSIS — K219 Gastro-esophageal reflux disease without esophagitis: Secondary | ICD-10-CM | POA: Diagnosis not present

## 2016-08-03 DIAGNOSIS — I6340 Cerebral infarction due to embolism of unspecified cerebral artery: Secondary | ICD-10-CM | POA: Insufficient documentation

## 2016-08-03 DIAGNOSIS — E108 Type 1 diabetes mellitus with unspecified complications: Secondary | ICD-10-CM | POA: Diagnosis not present

## 2016-08-03 DIAGNOSIS — Z7952 Long term (current) use of systemic steroids: Secondary | ICD-10-CM | POA: Insufficient documentation

## 2016-08-03 DIAGNOSIS — Z66 Do not resuscitate: Secondary | ICD-10-CM | POA: Insufficient documentation

## 2016-08-03 DIAGNOSIS — I998 Other disorder of circulatory system: Secondary | ICD-10-CM | POA: Diagnosis not present

## 2016-08-03 DIAGNOSIS — R1900 Intra-abdominal and pelvic swelling, mass and lump, unspecified site: Secondary | ICD-10-CM | POA: Insufficient documentation

## 2016-08-03 DIAGNOSIS — Z87891 Personal history of nicotine dependence: Secondary | ICD-10-CM | POA: Insufficient documentation

## 2016-08-03 LAB — BASIC METABOLIC PANEL
Anion gap: 9 (ref 5–15)
BUN: 21 mg/dL — ABNORMAL HIGH (ref 6–20)
CO2: 25 mmol/L (ref 22–32)
Calcium: 9 mg/dL (ref 8.9–10.3)
Chloride: 101 mmol/L (ref 101–111)
Creatinine, Ser: 0.89 mg/dL (ref 0.44–1.00)
GFR calc Af Amer: >60 mL/min (ref >60)
GFR calc non Af Amer: >60 mL/min (ref >60)
Glucose, Bld: 243 mg/dL — ABNORMAL HIGH (ref 65–99)
Potassium: 4.3 mmol/L (ref 3.5–5.1)
Sodium: 135 mmol/L (ref 135–145)

## 2016-08-03 LAB — MAGNESIUM: Magnesium: 1.4 mg/dL — ABNORMAL LOW (ref 1.7–2.4)

## 2016-08-03 MED ORDER — HEPARIN SOD (PORK) LOCK FLUSH 100 UNIT/ML IV SOLN
500.0000 [IU] | Freq: Once | INTRAVENOUS | Status: AC
  Administered 2016-08-03: 500 [IU] via INTRAVENOUS

## 2016-08-03 MED ORDER — HEPARIN SOD (PORK) LOCK FLUSH 100 UNIT/ML IV SOLN
INTRAVENOUS | Status: AC
  Filled 2016-08-03: qty 5

## 2016-08-03 MED ORDER — PANTOPRAZOLE SODIUM 40 MG PO TBEC: 40.0000 mg | DELAYED_RELEASE_TABLET | Freq: Two times a day (BID) | ORAL | 1 refills | Status: DC

## 2016-08-03 MED ORDER — MAGNESIUM SULFATE 4 GM/100ML IV SOLN
4.0000 g | Freq: Once | INTRAVENOUS | Status: AC
  Administered 2016-08-03: 4 g via INTRAVENOUS
  Filled 2016-08-03: qty 100

## 2016-08-03 NOTE — Telephone Encounter (Signed)
Last OV 07/26/16 last filled by Dr.Walker 11/24/15 180 1rf

## 2016-08-05 ENCOUNTER — Encounter: Payer: Medicare Other | Attending: Surgery | Admitting: Surgery

## 2016-08-05 DIAGNOSIS — M069 Rheumatoid arthritis, unspecified: Secondary | ICD-10-CM | POA: Insufficient documentation

## 2016-08-05 DIAGNOSIS — E10621 Type 1 diabetes mellitus with foot ulcer: Secondary | ICD-10-CM | POA: Insufficient documentation

## 2016-08-05 DIAGNOSIS — Z7952 Long term (current) use of systemic steroids: Secondary | ICD-10-CM | POA: Diagnosis not present

## 2016-08-05 DIAGNOSIS — Z794 Long term (current) use of insulin: Secondary | ICD-10-CM | POA: Diagnosis not present

## 2016-08-05 DIAGNOSIS — C787 Secondary malignant neoplasm of liver and intrahepatic bile duct: Secondary | ICD-10-CM | POA: Diagnosis not present

## 2016-08-05 DIAGNOSIS — Z87891 Personal history of nicotine dependence: Secondary | ICD-10-CM | POA: Diagnosis not present

## 2016-08-05 DIAGNOSIS — I110 Hypertensive heart disease with heart failure: Secondary | ICD-10-CM | POA: Diagnosis not present

## 2016-08-05 DIAGNOSIS — I5032 Chronic diastolic (congestive) heart failure: Secondary | ICD-10-CM | POA: Diagnosis not present

## 2016-08-05 DIAGNOSIS — C786 Secondary malignant neoplasm of retroperitoneum and peritoneum: Secondary | ICD-10-CM | POA: Diagnosis not present

## 2016-08-05 DIAGNOSIS — E1040 Type 1 diabetes mellitus with diabetic neuropathy, unspecified: Secondary | ICD-10-CM | POA: Diagnosis not present

## 2016-08-05 DIAGNOSIS — Z9221 Personal history of antineoplastic chemotherapy: Secondary | ICD-10-CM | POA: Insufficient documentation

## 2016-08-05 DIAGNOSIS — Z79899 Other long term (current) drug therapy: Secondary | ICD-10-CM | POA: Diagnosis not present

## 2016-08-05 DIAGNOSIS — L89623 Pressure ulcer of left heel, stage 3: Secondary | ICD-10-CM | POA: Diagnosis not present

## 2016-08-05 DIAGNOSIS — I2510 Atherosclerotic heart disease of native coronary artery without angina pectoris: Secondary | ICD-10-CM | POA: Insufficient documentation

## 2016-08-06 NOTE — Progress Notes (Addendum)
[name], [name] ([id number]) Visit Report for 08/05/2016 Arrival Information Details Patient Name: [name], [name] Date of Service: 08/05/2016 10:30 AM Medical Record Number: [id number] Patient Account Number: [id number] Date of Birth/Sex: 04/12/1946 (71 y.o. Female) Treating RN: Afful, RN, BSN, Velva Harman Primary Care Danyell Shader: Caryl Bis, ERIC Other Clinician: Referring Yoshiko Keleher: Caryl Bis, ERIC Treating Bianca Raneri/Extender: Frann Rider in Treatment: 5 Visit Information History Since Last Visit All ordered tests and consults were completed: No Patient Arrived: Cane Added or deleted any medications: No Arrival Time: 10:09 Any new allergies or adverse reactions: No Accompanied By: hubby Had a fall or experienced change in No Transfer Assistance: None activities of daily living that may affect Patient Identification Verified: Yes risk of falls: Secondary Verification Process Yes Signs or symptoms of abuse/neglect since last No Completed: visito Patient Requires Transmission- No Hospitalized since last visit: No Based Precautions: Has Dressing in Place as Prescribed: Yes Patient Has Alerts: Yes Pain Present Now: No Patient Alerts: Patient on Blood Thinner eliquis BID DMII Electronic Signature(s) Signed: 08/05/2016 10:13:31 AM By: Regan Lemming BSN, RN Entered By: Regan Lemming on 08/05/2016 10:13:31 [name] ([id number]) -------------------------------------------------------------------------------- Clinic Level of Care Assessment Details Patient Name: [name] Date of Service: 08/05/2016 10:30 AM Medical Record Number: [id number] Patient Account Number: [id number] Date of Birth/Sex: 01-03-1946 (71 y.o. Female) Treating RN: Afful, RN, BSN, [place] Primary Care Darienne Belleau: Caryl Bis, ERIC Other Clinician: Referring Caisley Baxendale: Caryl Bis, ERIC Treating Michelena Culmer/Extender: Frann Rider in Treatment: 31 Clinic Level of Care Assessment Items TOOL 4 Quantity  Score []  - Use when only an EandM is performed on FOLLOW-UP visit 0 ASSESSMENTS - Nursing Assessment / Reassessment X - Reassessment of Co-morbidities (includes updates in patient status) 1 10 X - Reassessment of Adherence to Treatment Plan 1 5 ASSESSMENTS - Wound and Skin Assessment / Reassessment X - Simple Wound Assessment / Reassessment - one wound 1 5 []  - Complex Wound Assessment / Reassessment - multiple wounds 0 []  - Dermatologic / Skin Assessment (not related to wound area) 0 ASSESSMENTS - Focused Assessment []  - Circumferential Edema Measurements - multi extremities 0 []  - Nutritional Assessment / Counseling / Intervention 0 X - Lower Extremity Assessment (monofilament, tuning fork, pulses) 1 5 []  - Peripheral Arterial Disease Assessment (using hand held doppler) 0 ASSESSMENTS - Ostomy and/or Continence Assessment and Care []  - Incontinence Assessment and Management 0 []  - Ostomy Care Assessment and Management (repouching, etc.) 0 PROCESS - Coordination of Care X - Simple Patient / Family Education for ongoing care 1 15 []  - Complex (extensive) Patient / Family Education for ongoing care 0 []  - Staff obtains Programmer, systems, Records, Test Results / Process Orders 0 []  - Staff telephones HHA, Nursing Homes / Clarify orders / etc 0 []  - Routine Transfer to another Facility (non-emergent condition) 0 [name], MCCAMPBELL. ([id number]) []  - Routine Hospital Admission (non-emergent condition) 0 []  - New Admissions / Biomedical engineer / Ordering NPWT, Apligraf, etc. 0 []  - Emergency Hospital Admission (emergent condition) 0 []  - Simple Discharge Coordination 0 []  - Complex (extensive) Discharge Coordination 0 PROCESS - Special Needs []  - Pediatric / Minor Patient Management 0 []  - Isolation Patient Management 0 []  - Hearing / Language / Visual special needs 0 []  - Assessment of Community assistance (transportation, D/C planning, etc.) 0 []  - Additional assistance / Altered mentation  0 []  - Support Surface(s) Assessment (bed, cushion, seat, etc.) 0 INTERVENTIONS - Wound Cleansing / Measurement X - Simple Wound Cleansing - one wound 1 5 []  -  Complex Wound Cleansing - multiple wounds 0 X - Wound Imaging (photographs - any number of wounds) 1 5 []  - Wound Tracing (instead of photographs) 0 X - Simple Wound Measurement - one wound 1 5 []  - Complex Wound Measurement - multiple wounds 0 INTERVENTIONS - Wound Dressings X - Small Wound Dressing one or multiple wounds 1 10 []  - Medium Wound Dressing one or multiple wounds 0 []  - Large Wound Dressing one or multiple wounds 0 []  - Application of Medications - topical 0 []  - Application of Medications - injection 0 INTERVENTIONS - Miscellaneous []  - External ear exam 0 [name], SHIN. ([id number]) []  - Specimen Collection (cultures, biopsies, blood, body fluids, etc.) 0 []  - Specimen(s) / Culture(s) sent or taken to Lab for analysis 0 []  - Patient Transfer (multiple staff / Harrel Lemon Lift / Similar devices) 0 []  - Simple Staple / Suture removal (25 or less) 0 []  - Complex Staple / Suture removal (26 or more) 0 []  - Hypo / Hyperglycemic Management (close monitor of Blood Glucose) 0 []  - Ankle / Brachial Index (ABI) - do not check if billed separately 0 X - Vital Signs 1 5 Has the patient been seen at the hospital within the last three years: Yes Total Score: 70 Level Of Care: New/Established - Level 2 Electronic Signature(s) Signed: 08/05/2016 3:18:42 PM By: Regan Lemming BSN, RN Entered By: Regan Lemming on 08/05/2016 10:26:46 [name] ([id number]) -------------------------------------------------------------------------------- Encounter Discharge Information Details Patient Name: [name] Date of Service: 08/05/2016 10:30 AM Medical Record Number: [id number] Patient Account Number: [id number] Date of Birth/Sex: 06-10-1945 (71 y.o. Female) Treating RN: Afful, RN, BSN, Velva Harman Primary Care Kemarion Abbey: Caryl Bis,  ERIC Other Clinician: Referring Fallan Mccarey: Caryl Bis, ERIC Treating Yasin Ducat/Extender: Frann Rider in Treatment: 31 Encounter Discharge Information Items Discharge Pain Level: 0 Discharge Condition: Stable Ambulatory Status: Ambulatory Discharge Destination: Home Transportation: Private Auto Accompanied By: Micheline Rough Schedule Follow-up Appointment: No Medication Reconciliation completed and provided to Patient/Care No Sunil Hue: Provided on Clinical Summary of Care: 08/05/2016 Form Type Recipient Paper Patient SR Electronic Signature(s) Signed: 08/05/2016 10:35:37 AM By: Ruthine Dose Entered By: Ruthine Dose on 08/05/2016 10:35:37 [name] ([id number]) -------------------------------------------------------------------------------- Lower Extremity Assessment Details Patient Name: [name] Date of Service: 08/05/2016 10:30 AM Medical Record Number: [id number] Patient Account Number: [id number] Date of Birth/Sex: [birth year] (71 y.o. Female) Treating RN: Afful, RN, BSN, Velva Harman Primary Care Natahlia Hoggard: Caryl Bis, ERIC Other Clinician: Referring Navi Ewton: Caryl Bis, ERIC Treating Josee Speece/Extender: Frann Rider in Treatment: 31 Edema Assessment Assessed: [Left: No] [Right: No] Edema: [Left: N] [Right: o] Vascular Assessment Claudication: Claudication Assessment [Left:None] Pulses: Dorsalis Pedis Palpable: [Left:Yes] Posterior Tibial Extremity colors, hair growth, and conditions: Extremity Color: [Left:Normal] Hair Growth on Extremity: [Left:No] Capillary Refill: [Left:< 3 seconds] Electronic Signature(s) Signed: 08/05/2016 3:18:42 PM By: Regan Lemming BSN, RN Entered By: Regan Lemming on 08/05/2016 10:15:06 [name] ([id number]) -------------------------------------------------------------------------------- Multi Wound Chart Details Patient Name: [name] Date of Service: 08/05/2016 10:30 AM Medical Record Number: [id number] Patient  Account Number: [id number] Date of Birth/Sex: 04-Mar-1946 (71 y.o. Female) Treating RN: Baruch Gouty, RN, BSN, Velva Harman Primary Care Amen Staszak: Caryl Bis, ERIC Other Clinician: Referring Basia Mcginty: Caryl Bis, ERIC Treating Zariana Strub/Extender: Frann Rider in Treatment: 31 Vital Signs Height(in): 63 Pulse(bpm): 72 Weight(lbs): 130 Blood Pressure 120/35 (mmHg): Body Mass Index(BMI): 23 Temperature(F): 98.1 Respiratory Rate 16 (breaths/min): Photos: [2:No Photos] [N/A:N/A] Wound Location: [2:Left Calcaneus] [N/A:N/A] Wounding Event: [2:Pressure Injury] [N/A:N/A] Primary Etiology: [2:Pressure Ulcer] [N/A:N/A] Secondary Etiology: [2:Diabetic Wound/Ulcer of the  Lower Extremity] [N/A:N/A] Comorbid History: [2:Cataracts, Type I Diabetes, Rheumatoid Arthritis, Osteoarthritis, Neuropathy, Received Chemotherapy] [N/A:N/A] Date Acquired: [2:07/15/2015] [N/A:N/A] Weeks of Treatment: [2:31] [N/A:N/A] Wound Status: [2:Open] [N/A:N/A] Measurements L x W x D 0.2x0.2x0.4 [N/A:N/A] (cm) Area (cm) : [2:0.031] [N/A:N/A] Volume (cm) : [2:0.013] [N/A:N/A] % Reduction in Area: [2:86.90%] [N/A:N/A] % Reduction in Volume: 86.20% [N/A:N/A] Classification: [2:Category/Stage III] [N/A:N/A] HBO Classification: [2:Grade 1] [N/A:N/A] Exudate Amount: [2:Small] [N/A:N/A] Exudate Type: [2:Serous] [N/A:N/A] Exudate Color: [2:amber] [N/A:N/A] Wound Margin: [2:Flat and Intact] [N/A:N/A] Granulation Amount: [2:Medium (34-66%)] [N/A:N/A] Granulation Quality: [2:Pale] [N/A:N/A] Necrotic Amount: [2:None Present (0%)] [N/A:N/A] Exposed Structures: [N/A:N/A] Fascia: No Fat Layer (Subcutaneous Tissue) Exposed: No Tendon: No Muscle: No Joint: No Bone: No Epithelialization: Large (67-100%) N/A N/A Periwound Skin Texture: Excoriation: No N/A N/A Induration: No Callus: No Crepitus: No Rash: No Scarring: No Periwound Skin Maceration: No N/A N/A Moisture: Dry/Scaly: No Periwound Skin Color: Atrophie Blanche:  No N/A N/A Cyanosis: No Ecchymosis: No Erythema: No Hemosiderin Staining: No Mottled: No Pallor: No Rubor: No Temperature: No Abnormality N/A N/A Tenderness on Yes N/A N/A Palpation: Wound Preparation: Ulcer Cleansing: N/A N/A Rinsed/Irrigated with Saline Topical Anesthetic Applied: None Treatment Notes Wound #2 (Left Calcaneus) 1. Cleansed with: Clean wound with Normal Saline 4. Dressing Applied: Other dressing (specify in notes) 5. Secondary Dressing Applied Bordered Foam Dressing Dry Gauze Notes used promogran in clinic. Patient have enough endorform at home Electronic Signature(s) [name], [name] ([id number]) Signed: 08/05/2016 10:33:19 AM By: Christin Fudge MD, FACS Entered By: Christin Fudge on 08/05/2016 10:33:19 [name], [name] ([id number]) -------------------------------------------------------------------------------- [place] Details Patient Name: [name] Date of Service: 08/05/2016 10:30 AM Medical Record Number: [id number] Patient Account Number: [id number] Date of Birth/Sex: 1946/01/18 (71 y.o. Female) Treating RN: Afful, RN, BSN, [employer] Primary Care Allison Silva: Caryl Bis, ERIC Other Clinician: Referring Amiliana Foutz: Caryl Bis, ERIC Treating Wendel Homeyer/Extender: Frann Rider in Treatment: 31 Active Inactive Electronic Signature(s) Signed: 09/07/2016 12:40:49 PM By: Gretta Cool RN, BSN, Kim RN, BSN Signed: 09/15/2016 9:03:53 AM By: Regan Lemming BSN, RN Previous Signature: 08/05/2016 3:18:42 PM Version By: Regan Lemming BSN, RN Entered By: Gretta Cool, RN, BSN, Kim on 09/07/2016 12:40:48 [name] ([id number]) -------------------------------------------------------------------------------- Pain Assessment Details Patient Name: [name] Date of Service: 08/05/2016 10:30 AM Medical Record Number: [id number] Patient Account Number: [id number] Date of Birth/Sex: [birth year] (71 y.o. Female) Treating RN: Afful, RN, BSN, Velva Harman Primary  Care Taji Sather: Caryl Bis, ERIC Other Clinician: Referring Ira Busbin: Caryl Bis, ERIC Treating Safira Proffit/Extender: Frann Rider in Treatment: 31 Active Problems Location of Pain Severity and Description of Pain Patient Has Paino No Site Locations With Dressing Change: No Pain Management and Medication Current Pain Management: Electronic Signature(s) Signed: 08/05/2016 3:18:42 PM By: Regan Lemming BSN, RN Entered By: Regan Lemming on 08/05/2016 10:14:13 [name] ([id number]) -------------------------------------------------------------------------------- Patient/Caregiver Education Details Patient Name: [name] Date of Service: 08/05/2016 10:30 AM Medical Record Number: [id number] Patient Account Number: [id number] Date of Birth/Gender: 1945-12-20 (71 y.o. Female) Treating RN: Baruch Gouty, RN, BSN, Velva Harman Primary Care Physician: Caryl Bis, ERIC Other Clinician: Referring Physician: Caryl Bis, ERIC Treating Physician/Extender: Frann Rider in Treatment: 39 Education Assessment Education Provided To: Patient Education Topics Provided Pressure: Methods: Explain/Verbal Responses: State content correctly Welcome To [facility]: Methods: Explain/Verbal Responses: State content correctly Wound/Skin Impairment: Methods: Explain/Verbal Responses: State content correctly Electronic Signature(s) Signed: 08/05/2016 3:18:42 PM By: Regan Lemming BSN, RN Entered By: Regan Lemming on 08/05/2016 10:28:39 [name] ([id number]) -------------------------------------------------------------------------------- Wound Assessment Details Patient Name: [name]. Date  of Service: 08/05/2016 10:30 AM Medical Record Number: [id number] Patient Account Number: [id number] Date of Birth/Sex: Oct 04, 1945 (71 y.o. Female) Treating RN: Afful, RN, BSN, [place] Primary Care Ladislav Caselli: Caryl Bis, ERIC Other Clinician: Referring Gwendoline Judy: Caryl Bis, ERIC Treating  Kaysin Brock/Extender: Frann Rider in Treatment: 31 Wound Status Wound Number: 2 Primary Pressure Ulcer Etiology: Wound Location: Left Calcaneus Secondary Diabetic Wound/Ulcer of the Lower Wounding Event: Pressure Injury Etiology: Extremity Date Acquired: 07/15/2015 Wound Open Weeks Of Treatment: 31 Status: Clustered Wound: No Comorbid Cataracts, Type I Diabetes, History: Rheumatoid Arthritis, Osteoarthritis, Neuropathy, Received Chemotherapy Photos Photo Uploaded By: Regan Lemming on 08/05/2016 12:46:01 Wound Measurements Length: (cm) 0.2 Width: (cm) 0.2 Depth: (cm) 0.4 Area: (cm) 0.031 Volume: (cm) 0.013 % Reduction in Area: 86.9% % Reduction in Volume: 86.2% Epithelialization: Large (67-100%) Tunneling: No Undermining: No Wound Description Classification: Category/Stage III Foul Odor Aft Diabetic Severity Earleen Newport): Grade 1 Slough/Fibrin SHIRLEAN, BERMAN ([id number]) er Cleansing: No o No Wound Margin: Flat and Intact Exudate Amount: Small Exudate Type: Serous Exudate Color: amber Wound Bed Granulation Amount: Medium (34-66%) Exposed Structure Granulation Quality: Pale Fascia Exposed: No Necrotic Amount: None Present (0%) Fat Layer (Subcutaneous Tissue) Exposed: No Tendon Exposed: No Muscle Exposed: No Joint Exposed: No Bone Exposed: No Periwound Skin Texture Texture Color No Abnormalities Noted: No No Abnormalities Noted: No Callus: No Atrophie Blanche: No Crepitus: No Cyanosis: No Excoriation: No Ecchymosis: No Induration: No Erythema: No Rash: No Hemosiderin Staining: No Scarring: No Mottled: No Pallor: No Moisture Rubor: No No Abnormalities Noted: No Dry / Scaly: No Temperature / Pain Maceration: No Temperature: No Abnormality Tenderness on Palpation: Yes Wound Preparation Ulcer Cleansing: Rinsed/Irrigated with Saline Topical Anesthetic Applied: None Electronic Signature(s) Signed: 08/05/2016 3:18:42 PM By: Regan Lemming BSN,  RN Entered By: Regan Lemming on 08/05/2016 10:22:36 [name] ([id number]) -------------------------------------------------------------------------------- Vitals Details Patient Name: [name] Date of Service: 08/05/2016 10:30 AM Medical Record Number: [id number] Patient Account Number: [id number] Date of Birth/Sex: 08/30/1945 (71 y.o. Female) Treating RN: Afful, RN, BSN, [place] Primary Care Kalum Minner: Caryl Bis, ERIC Other Clinician: Referring Rhianne Soman: Caryl Bis, ERIC Treating Jerzey Komperda/Extender: Frann Rider in Treatment: 31 Vital Signs Time Taken: 10:14 Temperature (F): 98.1 Height (in): 63 Pulse (bpm): 72 Weight (lbs): 130 Respiratory Rate (breaths/min): 16 Body Mass Index (BMI): 23 Blood Pressure (mmHg): 120/35 Reference Range: 80 - 120 mg / dl Electronic Signature(s) Signed: 08/05/2016 3:18:42 PM By: Regan Lemming BSN, RN Entered By: Regan Lemming on 08/05/2016 10:20:00

## 2016-08-06 NOTE — Progress Notes (Signed)
[name] ([id number]) Visit Report for 08/05/2016 Chief Complaint Document Details Patient Name: [name], [name] Date of Service: 08/05/2016 10:30 AM Medical Record Number: [id number] Patient Account Number: [id number] Date of Birth/Sex: 11/10/1945 (70 y.o. Female) Treating RN: Baruch Gouty, RN, BSN, Velva Harman Primary Care Provider: Caryl Bis, ERIC Other Clinician: Referring Provider: Caryl Bis, ERIC Treating Provider/Extender: Frann Rider in Treatment: 31 Information Obtained from: Patient Chief Complaint [name] presents for evaluation of her left posterior heel pressure ulcer Electronic Signature(s) Signed: 08/05/2016 10:33:29 AM By: Christin Fudge MD, FACS Entered By: Christin Fudge on 08/05/2016 10:33:29 [name] ([id number]) -------------------------------------------------------------------------------- HPI Details Patient Name: [name] Date of Service: 08/05/2016 10:30 AM Medical Record Number: [id number] Patient Account Number: [id number] Date of Birth/Sex: 12/04/1945 (70 y.o. Female) Treating RN: Afful, RN, BSN, Velva Harman Primary Care Provider: Caryl Bis, ERIC Other Clinician: Referring Provider: Caryl Bis, ERIC Treating Provider/Extender: Frann Rider in Treatment: 31 History of Present Illness Location: right heel Quality: Patient reports experiencing a dull pain to affected area(s). Severity: Patient states wound are getting worse. Duration: Patient has had the wound for <5 months prior to presenting for treatment Timing: Pain in wound is Intermittent (comes and goes Context: The wound appeared gradually over time Modifying Factors: Consults to this date include: local care was done as per the medication given by the PCP which may have been medihoney Associated Signs and Symptoms: Patient reports having difficulty standing for long periods. 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 In the past she was seen for a decubitus ulcer on her right gluteal area. She is known to have diabetes mellitus type 1 and her most recent A1c was 7.2% Past medical history significant for leukopenia, cholelithiasis, hypertension, chronic diastolic CHF, coronary artery disease,type 1 diabetes mellitus, rheumatoid arthritis, collagen vascular disease, ovarian cancer, status post CABG in 2014 and status post Port-A-Cath insertion by Dr. Leotis Pain in October 2016. She has quit smoking about 20 years ago. She was advised to use Medihoney with calcium alginate pads to be applied over the wound. 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 01/07/2016 -- x-ray of the left heel done -- IMPRESSION: No radiographic evidence of osteomyelitis. If this remains a clinical concern, recommend MR imaging. 02/05/16: returns today for f/u. denies fever, chills, body aches or malaise. no interval changes regarding health status. 02/12/16: pt is doing well. wound has improved. no s/s of infection. no systemic s/s of infection. She reports will be on vacation next week, and requests appointment in 2 weeks. 03/18/2016 - is going to be starting her chemotherapy this coming Monday and will have cycles every 3 weeks 04-01-16 [name], accompanied by her husband, presents for evaluation of her left posterior heel stage III pressure ulcer. She states that she started chemotherapy last week. Her chemotherapy is on a cycle of one week on 3 weeks off, with her next treatment  scheduled for December 11. There has been an increase in drainage since her last appointment. She denies any pain. They have been  using PolyMem silver rope. [name], [name] ([id number]) 04/22/16 - [name] presents today, accompanied by her husband, for a violation of her left posterior low heel pressure ulcer. She continues to receive chemotherapy, with the exception of last week due to neutropenia. Her next scheduled chemotherapy is on January 2. She states that her chemotherapy schedule is 1 day on 8 days off. She and her husband deny any overt changes to her wound, denies pain, denies any increased drainage. 04/29/2016 -- the copayment for the skin substitute is about $300 a week and they have declined use of this material. Electronic Signature(s) Signed: 08/05/2016 10:33:38 AM By: Christin Fudge MD, FACS Entered By: Christin Fudge on 08/05/2016 10:33:37 [name] ([id number]) -------------------------------------------------------------------------------- Physical Exam Details Patient Name: [name] Date of Service: 08/05/2016 10:30 AM Medical Record Number: [id number] Patient Account Number: [id number] Date of Birth/Sex: 05-May-1945 (70 y.o. Female) Treating RN: Baruch Gouty, RN, BSN, Velva Harman Primary Care Provider: Caryl Bis, ERIC Other Clinician: Referring Provider: Caryl Bis, ERIC Treating Provider/Extender: Frann Rider in Treatment: 31 Constitutional . Pulse regular. Respirations normal and unlabored. Afebrile. . Eyes Nonicteric. Reactive to light. Ears, Nose, Mouth, and Throat Lips, teeth, and gums WNL.Marland Kitchen Moist mucosa without lesions. Neck supple and nontender. No palpable supraclavicular or cervical adenopathy. Normal sized without goiter. Respiratory WNL. No retractions.. Breath sounds WNL, No rubs, rales, rhonchi, or wheeze.. Cardiovascular Heart rhythm and rate regular, no murmur or gallop.. Pedal Pulses WNL. No clubbing, cyanosis or edema. Chest Breasts  symmetical and no nipple discharge.. Breast tissue WNL, no masses, lumps, or tenderness.. Lymphatic No adneopathy. No adenopathy. No adenopathy. Musculoskeletal Adexa without tenderness or enlargement.. Digits and nails w/o clubbing, cyanosis, infection, petechiae, ischemia, or inflammatory conditions.. Integumentary (Hair, Skin) No suspicious lesions. No crepitus or fluctuance. No peri-wound warmth or erythema. No masses.Marland Kitchen Psychiatric Judgement and insight Intact.. No evidence of depression, anxiety, or agitation.. Notes the wound is the best I have seen it and will not admit the tip of a fine probe. There is no cellulitis and there is a small dimple in the skin. Electronic Signature(s) Signed: 08/05/2016 10:34:04 AM By: Christin Fudge MD, FACS Entered By: Christin Fudge on 08/05/2016 10:34:03 [name] ([id number]) -------------------------------------------------------------------------------- Physician Orders Details Patient Name: [name] Date of Service: 08/05/2016 10:30 AM Medical Record Number: [id number] Patient Account Number: [id number] Date of Birth/Sex: [birth year] (70 y.o. Female) Treating RN: Afful, RN, BSN, Velva Harman Primary Care Provider: Caryl Bis, ERIC Other Clinician: Referring Provider: Caryl Bis, ERIC Treating Provider/Extender: Frann Rider in Treatment: 86 Verbal / Phone Orders: No Diagnosis Coding Wound Cleansing Wound #2 Left Calcaneus o Clean wound with Normal Saline. Primary Wound Dressing Wound #2 Left Calcaneus o Other: - Endoform ..lay it on top, do not pack into wound Secondary Dressing Wound #2 Left Calcaneus o Dry Gauze - 2x2 o Boardered Foam Dressing Dressing Change Frequency Wound #2 Left Calcaneus o Change dressing every other day. Follow-up Appointments Wound #2 Left Calcaneus o Return Appointment in 1 month - due to financial strains Edema Control Wound #2 Left Calcaneus o Patient to wear own compression  stockings - Patient instructed to wear 20-30 mmHg gradient o Elevate legs to the level of the heart and pump ankles as often as possible o Support Garment 20-30 mm/Hg pressure to: Additional Orders / Instructions Wound #2 Left Calcaneus o Activity as tolerated Electronic Signature(s) Signed: 08/05/2016 3:18:42 PM By: Regan Lemming BSN, RN Signed: 08/05/2016 4:19:22 PM By: Christin Fudge MD, FACS Mally, [name] ([id number]) Entered By:  Regan Lemming on 08/05/2016 10:26:19 [name], [name] ([id number]) -------------------------------------------------------------------------------- Problem List Details Patient Name: [name], [name] Date of Service: 08/05/2016 10:30 AM Medical Record Number: [id number] Patient Account Number: [id number] Date of Birth/Sex: [birth year] (70 y.o. Female) Treating RN: Baruch Gouty, RN, BSN, Velva Harman Primary Care Provider: Caryl Bis, ERIC Other Clinician: Referring Provider: Caryl Bis, ERIC Treating Provider/Extender: Frann Rider in Treatment: 31 Active Problems ICD-10 Encounter Code Description Active Date Diagnosis E10.621 Type 1 diabetes mellitus with foot ulcer 12/31/2015 Yes L89.623 Pressure ulcer of left heel, stage 3 12/31/2015 Yes Z92.21 Personal history of antineoplastic chemotherapy 12/31/2015 Yes Z79.52 Long term (current) use of systemic steroids 12/31/2015 Yes Inactive Problems Resolved Problems Electronic Signature(s) Signed: 08/05/2016 10:33:13 AM By: Christin Fudge MD, FACS Entered By: Christin Fudge on 08/05/2016 10:33:13 [name] ([id number]) -------------------------------------------------------------------------------- Progress Note Details Patient Name: [name] Date of Service: 08/05/2016 10:30 AM Medical Record Number: [id number] Patient Account Number: [id number] Date of Birth/Sex: 1946/04/09 (70 y.o. Female) Treating RN: Baruch Gouty, RN, BSN, Velva Harman Primary Care Provider: Caryl Bis, ERIC Other Clinician: Referring Provider:  Caryl Bis, ERIC Treating Provider/Extender: Frann Rider in Treatment: 31 Subjective Chief Complaint Information obtained from Patient Mrs. Slevin presents for evaluation of her left posterior heel pressure ulcer History of Present Illness (HPI) The following HPI elements were documented for the patient's wound: Location: right heel Quality: Patient reports experiencing a dull pain to affected area(s). Severity: Patient states wound are getting worse. Duration: Patient has had the wound for Timing: Pain in wound is Intermittent (comes and goes Context: The wound appeared gradually over time Modifying Factors: Consults to this date include: local care was done as per the medication given by the PCP which may have been medihoney Associated Signs and Symptoms: Patient reports having difficulty standing for long periods. 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 In the past she was seen for a decubitus ulcer on her right gluteal area. She is known to have diabetes mellitus type 1 and her most recent A1c was 7.2% Past medical history significant for leukopenia, cholelithiasis, hypertension, chronic diastolic CHF, coronary artery disease,type 1 diabetes mellitus, rheumatoid arthritis, collagen vascular disease, ovarian cancer, status post CABG in 2014 and status post Port-A-Cath insertion by Dr. Leotis Pain in October 2016. She has quit smoking about 20 years ago. She was advised to use Medihoney with calcium alginate pads to be applied over the wound. 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 01/07/2016 -- x-ray of the left heel done -- IMPRESSION: No radiographic evidence of osteomyelitis. If this remains a clinical concern, recommend MR imaging. 02/05/16: returns today for f/u. denies fever, chills, body aches or malaise. no interval changes regarding health status. 02/12/16: pt is doing well. wound has improved. no s/s of infection. no systemic s/s of infection. She reports will be on vacation next week, and requests appointment in 2 weeks. 03/18/2016 - is going to be starting her chemotherapy this coming Monday and will have cycles every 3 [name], BRAU. ([id number]) weeks 04-01-16 [name], accompanied by her  husband, presents for evaluation of her left posterior heel stage III pressure ulcer. She states that she started chemotherapy last week. Her chemotherapy is on a cycle of one week on 3 weeks off, with her next treatment scheduled for December 11. There has been an increase in drainage since her last appointment. She denies any pain. They have been using PolyMem silver rope. 04/22/16 - [name] presents today, accompanied by her husband, for a violation of her left posterior low heel pressure ulcer. She continues to receive chemotherapy, with the exception of last week due to neutropenia. Her next scheduled chemotherapy is on January 2. She states that her chemotherapy schedule is 1 day on 8 days off. She and her husband deny any overt changes to her wound, denies pain, denies any increased drainage. 04/29/2016 -- the copayment for the skin substitute is about $300 a week and they have declined use of this material. Objective Constitutional Pulse regular. Respirations normal and unlabored. Afebrile. Vitals Time Taken: 10:14 AM, Height: 63 in, Weight: 130 lbs, BMI: 23, Temperature: 98.1 F, Pulse: 72 bpm, Respiratory Rate: 16 breaths/min, Blood Pressure: 120/35  mmHg. Eyes Nonicteric. Reactive to light. Ears, Nose, Mouth, and Throat Lips, teeth, and gums WNL.Marland Kitchen Moist mucosa without lesions. Neck supple and nontender. No palpable supraclavicular or cervical adenopathy. Normal sized without goiter. Respiratory WNL. No retractions.. Breath sounds WNL, No rubs, rales, rhonchi, or wheeze.. Cardiovascular Heart rhythm and rate regular, no murmur or gallop.. Pedal Pulses WNL. No clubbing, cyanosis or edema. Chest Breasts symmetical and no nipple discharge.. Breast tissue WNL, no masses, lumps, or tenderness.. Lymphatic No adneopathy. No adenopathy. No adenopathy. [name], [name] ([id number]) Musculoskeletal Adexa without tenderness or enlargement.. Digits and nails w/o clubbing, cyanosis, infection, petechiae, ischemia, or inflammatory conditions.Marland Kitchen Psychiatric Judgement and insight Intact.. No evidence of depression, anxiety, or agitation.. General Notes: the wound is the best I have seen it and will not admit the tip of a fine probe. There is no cellulitis and there is a small dimple in the skin. Integumentary (Hair, Skin) No suspicious lesions. No crepitus or fluctuance. No peri-wound warmth or erythema. No masses.. Wound #2 status is Open. Original cause of wound was Pressure Injury. The wound is located on the Left Calcaneus. The wound measures 0.2cm length x 0.2cm width x 0.4cm depth; 0.031cm^2 area and 0.013cm^3 volume. There is no tunneling or undermining noted. There is a small amount of serous drainage noted. The wound margin is flat and intact. There is medium (34-66%) pale granulation within the wound bed. There is no necrotic tissue within the wound bed. The periwound skin appearance did not exhibit: Callus, Crepitus, Excoriation, Induration, Rash, Scarring, Dry/Scaly, Maceration, Atrophie Blanche, Cyanosis, Ecchymosis, Hemosiderin Staining, Mottled, Pallor, Rubor, Erythema. Periwound temperature was noted as No Abnormality. The  periwound has tenderness on palpation. Assessment Active Problems ICD-10 E10.621 - Type 1 diabetes mellitus with foot ulcer L89.623 - Pressure ulcer of left heel, stage 3 Z92.21 - Personal history of antineoplastic chemotherapy Z79.52 - Long term (current) use of systemic steroids Plan Wound Cleansing: Wound #2 Left Calcaneus: Clean wound with Normal Saline. Primary Wound Dressing: Wound #2 Left Calcaneus: Other: - Endoform ..lay it on top, do not pack into wound Secondary Dressing: [name], [name]. ([id number]) Wound #2 Left Calcaneus: Dry Gauze - 2x2 Boardered Foam Dressing Dressing Change Frequency: Wound #2 Left Calcaneus: Change dressing every other day. Follow-up Appointments: Wound #2 Left Calcaneus: Return Appointment in 1 month - due to financial strains Edema Control: Wound #  2 Left Calcaneus: Patient to wear own compression stockings - Patient instructed to wear 20-30 mmHg gradient Elevate legs to the level of the heart and pump ankles as often as possible Support Garment 20-30 mm/Hg pressure to: Additional Orders / Instructions: Wound #2 Left Calcaneus: Activity as tolerated They have been using endoform gently packing into the wound, and I have asked him to continue this. She will change it daily or every other day Recommended to be careful about offloading. Nutrition, vitamins have also been discussed with her. She is still taking her chemotherapy currently. Due to high price of her copayments they want to spread out the visits and will come once in about 3 weeks. I anticipate discharge by the next time she is here Electronic Signature(s) Signed: 08/05/2016 10:35:29 AM By: Christin Fudge MD, FACS Entered By: Christin Fudge on 08/05/2016 10:35:29 [name] ([id number]) -------------------------------------------------------------------------------- SuperBill Details Patient Name: [name] Date of Service: 08/05/2016 Medical Record Number:  [id number] Patient Account Number: [id number] Date of Birth/Sex: 1945/06/01 (70 y.o. Female) Treating RN: Afful, RN, BSN, Velva Harman Primary Care Provider: Caryl Bis, ERIC Other Clinician: Referring Provider: Caryl Bis, ERIC Treating Provider/Extender: Frann Rider in Treatment: 31 Diagnosis Coding ICD-10 Codes Code Description E10.621 Type 1 diabetes mellitus with foot ulcer L89.623 Pressure ulcer of left heel, stage 3 Z92.21 Personal history of antineoplastic chemotherapy Z79.52 Long term (current) use of systemic steroids Facility Procedures CPT4 Code: [id number] Description: [phone] - WOUND CARE VISIT-LEV 2 EST PT Modifier: Quantity: 1 Physician Procedures CPT4 Code: [id number] Description: 32122 - WC PHYS LEVEL 3 - EST PT ICD-10 Description Diagnosis E10.621 Type 1 diabetes mellitus with foot ulcer L89.623 Pressure ulcer of left heel, stage 3 Z92.21 Personal history of antineoplastic chemother Z79.52 Long term (current) use of  systemic steroids Modifier: apy Quantity: 1 Electronic Signature(s) Signed: 08/05/2016 10:35:43 AM By: Christin Fudge MD, FACS Entered By: Christin Fudge on 08/05/2016 10:35:43

## 2016-08-07 NOTE — Progress Notes (Signed)
Would call and see if BP running low If still low, would cut losartan in 1/2 daily

## 2016-08-09 ENCOUNTER — Emergency Department: Payer: Medicare Other

## 2016-08-09 ENCOUNTER — Emergency Department
Admission: EM | Admit: 2016-08-09 | Discharge: 2016-08-10 | Disposition: A | Discharge status: Other Acute Inpatient Hospital | Payer: Medicare Other | Attending: Emergency Medicine | Admitting: Emergency Medicine

## 2016-08-09 ENCOUNTER — Inpatient Hospital Stay: Payer: Medicare Other

## 2016-08-09 ENCOUNTER — Telehealth: Payer: Self-pay | Admitting: *Deleted

## 2016-08-09 DIAGNOSIS — I110 Hypertensive heart disease with heart failure: Secondary | ICD-10-CM | POA: Insufficient documentation

## 2016-08-09 DIAGNOSIS — Z7901 Long term (current) use of anticoagulants: Secondary | ICD-10-CM | POA: Insufficient documentation

## 2016-08-09 DIAGNOSIS — I5032 Chronic diastolic (congestive) heart failure: Secondary | ICD-10-CM | POA: Insufficient documentation

## 2016-08-09 DIAGNOSIS — I2510 Atherosclerotic heart disease of native coronary artery without angina pectoris: Secondary | ICD-10-CM | POA: Insufficient documentation

## 2016-08-09 DIAGNOSIS — E109 Type 1 diabetes mellitus without complications: Secondary | ICD-10-CM | POA: Diagnosis not present

## 2016-08-09 DIAGNOSIS — C800 Disseminated malignant neoplasm, unspecified: Secondary | ICD-10-CM | POA: Diagnosis not present

## 2016-08-09 DIAGNOSIS — Z87891 Personal history of nicotine dependence: Secondary | ICD-10-CM | POA: Diagnosis not present

## 2016-08-09 DIAGNOSIS — K56699 Other intestinal obstruction unspecified as to partial versus complete obstruction: Secondary | ICD-10-CM | POA: Diagnosis not present

## 2016-08-09 DIAGNOSIS — R1084 Generalized abdominal pain: Secondary | ICD-10-CM | POA: Diagnosis present

## 2016-08-09 DIAGNOSIS — Z79899 Other long term (current) drug therapy: Secondary | ICD-10-CM | POA: Insufficient documentation

## 2016-08-09 DIAGNOSIS — K56609 Unspecified intestinal obstruction, unspecified as to partial versus complete obstruction: Secondary | ICD-10-CM

## 2016-08-09 LAB — COMPREHENSIVE METABOLIC PANEL
ALBUMIN: 3.4 g/dL — AB (ref 3.5–5.0)
ALT: 49 U/L (ref 14–54)
AST: 32 U/L (ref 15–41)
Alkaline Phosphatase: 206 U/L — ABNORMAL HIGH (ref 38–126)
Anion gap: 11 (ref 5–15)
BILIRUBIN TOTAL: 0.7 mg/dL (ref 0.3–1.2)
BUN: 17 mg/dL (ref 6–20)
CHLORIDE: 97 mmol/L — AB (ref 101–111)
CO2: 27 mmol/L (ref 22–32)
Calcium: 9.5 mg/dL (ref 8.9–10.3)
Creatinine, Ser: 0.83 mg/dL (ref 0.44–1.00)
GFR calc Af Amer: >60 mL/min (ref >60)
GLUCOSE: 185 mg/dL — AB (ref 65–99)
POTASSIUM: 3.7 mmol/L (ref 3.5–5.1)
Sodium: 135 mmol/L (ref 135–145)
TOTAL PROTEIN: 7.1 g/dL (ref 6.5–8.1)

## 2016-08-09 LAB — GASTROINTESTINAL PANEL BY PCR, STOOL (REPLACES STOOL CULTURE)
ADENOVIRUS F40/41: NOT DETECTED
ASTROVIRUS: NOT DETECTED
CAMPYLOBACTER SPECIES: NOT DETECTED
CRYPTOSPORIDIUM: NOT DETECTED
Cyclospora cayetanensis: NOT DETECTED
ENTEROAGGREGATIVE E COLI (EAEC): NOT DETECTED
ENTEROPATHOGENIC E COLI (EPEC): NOT DETECTED
ENTEROTOXIGENIC E COLI (ETEC): NOT DETECTED
Entamoeba histolytica: NOT DETECTED
GIARDIA LAMBLIA: NOT DETECTED
Norovirus GI/GII: NOT DETECTED
Plesimonas shigelloides: NOT DETECTED
ROTAVIRUS A: NOT DETECTED
Salmonella species: NOT DETECTED
Sapovirus (I, II, IV, and V): NOT DETECTED
Shiga like toxin producing E coli (STEC): NOT DETECTED
Shigella/Enteroinvasive E coli (EIEC): NOT DETECTED
Vibrio cholerae: NOT DETECTED
Vibrio species: NOT DETECTED
YERSINIA ENTEROCOLITICA: NOT DETECTED

## 2016-08-09 LAB — URINALYSIS, COMPLETE (UACMP) WITH MICROSCOPIC
BACTERIA UA: NONE SEEN
BILIRUBIN URINE: NEGATIVE
GLUCOSE, UA: NEGATIVE mg/dL
HGB URINE DIPSTICK: NEGATIVE
KETONES UR: NEGATIVE mg/dL
LEUKOCYTES UA: NEGATIVE
NITRITE: NEGATIVE
PROTEIN: NEGATIVE mg/dL
Specific Gravity, Urine: 1.012 (ref 1.005–1.030)
pH: 5 (ref 5.0–8.0)

## 2016-08-09 LAB — CBC WITH DIFFERENTIAL/PLATELET
BASOS ABS: 0 10*3/uL (ref 0–0.1)
Basophils Relative: 1 %
EOS ABS: 0 10*3/uL (ref 0–0.7)
EOS PCT: 1 %
HCT: 34.2 % — ABNORMAL LOW (ref 35.0–47.0)
Hemoglobin: 11.6 g/dL — ABNORMAL LOW (ref 12.0–16.0)
Lymphocytes Relative: 13 %
Lymphs Abs: 0.6 10*3/uL — ABNORMAL LOW (ref 1.0–3.6)
MCH: 31.5 pg (ref 26.0–34.0)
MCHC: 34 g/dL (ref 32.0–36.0)
MCV: 92.6 fL (ref 80.0–100.0)
MONO ABS: 0.5 10*3/uL (ref 0.2–0.9)
Monocytes Relative: 12 %
Neutro Abs: 3.5 10*3/uL (ref 1.4–6.5)
Neutrophils Relative %: 73 %
Platelets: 311 10*3/uL (ref 150–440)
RBC: 3.69 MIL/uL — AB (ref 3.80–5.20)
RDW: 15 % — AB (ref 11.5–14.5)
WBC: 4.7 10*3/uL (ref 3.6–11.0)

## 2016-08-09 LAB — PHOSPHORUS: PHOSPHORUS: 4.1 mg/dL (ref 2.5–4.6)

## 2016-08-09 LAB — MAGNESIUM: Magnesium: 1.4 mg/dL — ABNORMAL LOW (ref 1.7–2.4)

## 2016-08-09 LAB — LIPASE, BLOOD: Lipase: 20 U/L (ref 11–51)

## 2016-08-09 LAB — GLUCOSE, CAPILLARY: Glucose-Capillary: 86 mg/dL (ref 65–99)

## 2016-08-09 LAB — C DIFFICILE QUICK SCREEN W PCR REFLEX
C DIFFICILE (CDIFF) INTERP: NOT DETECTED
C DIFFICILE (CDIFF) TOXIN: NEGATIVE
C DIFFICLE (CDIFF) ANTIGEN: NEGATIVE

## 2016-08-09 MED ORDER — LACTATED RINGERS IV BOLUS (SEPSIS)
1000.0000 mL | Freq: Once | INTRAVENOUS | Status: AC
  Administered 2016-08-09: 1000 mL via INTRAVENOUS

## 2016-08-09 MED ORDER — ONDANSETRON HCL 4 MG/2ML IJ SOLN
4.0000 mg | Freq: Once | INTRAMUSCULAR | Status: AC
  Administered 2016-08-09: 4 mg via INTRAVENOUS
  Filled 2016-08-09: qty 2

## 2016-08-09 MED ORDER — ENOXAPARIN SODIUM 60 MG/0.6ML ~~LOC~~ SOLN: 0.6000 mg | Freq: Once | SUBCUTANEOUS | Status: DC

## 2016-08-09 MED ORDER — IOPAMIDOL (ISOVUE-300) INJECTION 61%
100.0000 mL | Freq: Once | INTRAVENOUS | Status: AC | PRN
  Administered 2016-08-09: 100 mL via INTRAVENOUS

## 2016-08-09 MED ORDER — MAGNESIUM SULFATE 2 GM/50ML IV SOLN
2.0000 g | INTRAVENOUS | Status: AC
  Administered 2016-08-09: 2 g via INTRAVENOUS
  Filled 2016-08-09: qty 50

## 2016-08-09 MED ORDER — FAMOTIDINE IN NACL 20-0.9 MG/50ML-% IV SOLN
20.0000 mg | Freq: Once | INTRAVENOUS | Status: AC
  Administered 2016-08-09: 20 mg via INTRAVENOUS
  Filled 2016-08-09: qty 50

## 2016-08-09 MED ORDER — LIDOCAINE HCL (PF) 1 % IJ SOLN
5.0000 mL | Freq: Once | INTRAMUSCULAR | Status: AC
  Administered 2016-08-09: 5 mL
  Filled 2016-08-09: qty 5

## 2016-08-09 MED ORDER — MORPHINE SULFATE (PF) 4 MG/ML IV SOLN
4.0000 mg | Freq: Once | INTRAVENOUS | Status: AC
Start: 2016-08-09 — End: 2016-08-09
  Administered 2016-08-09: 4 mg via INTRAVENOUS
  Filled 2016-08-09: qty 1

## 2016-08-09 MED ORDER — ENOXAPARIN SODIUM 40 MG/0.4ML ~~LOC~~ SOLN
60.0000 mg | Freq: Once | SUBCUTANEOUS | Status: AC
  Administered 2016-08-09: 60 mg via SUBCUTANEOUS

## 2016-08-09 MED ORDER — IOPAMIDOL (ISOVUE-300) INJECTION 61%
30.0000 mL | Freq: Once | INTRAVENOUS | Status: AC
  Administered 2016-08-09: 30 mL via ORAL

## 2016-08-09 MED ORDER — ENOXAPARIN SODIUM 40 MG/0.4ML ~~LOC~~ SOLN
SUBCUTANEOUS | Status: AC
  Filled 2016-08-09: qty 0.8

## 2016-08-09 MED ORDER — LIDOCAINE HCL 2 % EX GEL
1.0000 "application " | Freq: Once | CUTANEOUS | Status: AC
  Administered 2016-08-09: 1
  Filled 2016-08-09: qty 5

## 2016-08-09 MED ORDER — SODIUM CHLORIDE 0.9 % IV BOLUS (SEPSIS)
1000.0000 mL | Freq: Once | INTRAVENOUS | Status: AC
  Administered 2016-08-09: 1000 mL via INTRAVENOUS

## 2016-08-09 NOTE — Telephone Encounter (Signed)
Called to report that she does not feel she can get Magnesium infusion today. Her stomach is hurting, she is not passing much urine, her bowels are not moving, and her abdomen is tight. Per VO Dr Mike Gip, patient advised she needs to go to the ER. She begrudgingly agreed to go after I explained that she will need a lot of imaging that can be done quicker than we can get done to evaluate for ascites, etc.  Infusion informed of patient not coming for Mag. Lab notified of cancellation

## 2016-08-09 NOTE — ED Provider Notes (Signed)
[facility] Emergency Department Provider Note  ____________________________________________  Time seen: Approximately 1:35 PM  I have reviewed the triage vital signs and the nursing notes.   HISTORY  Chief Complaint Abdominal Pain    HPI [name] is a 71 y.o. female complains of generalized abdominal pain and distention since yesterday. She also reports pain is cramping and nonradiating, occurring episodically. Associated vomiting yesterday. She is able eat dinner last night without difficulty, but then started having more pain and vomiting overnight at 3 AM and was unable to tolerate breakfast this morning. She has a history of ovarian cancer with peritoneal carcinomatosis. She is on chemotherapy with Dr. Mike Gip. Last chemotherapy was about 3 weeks ago, scheduled for next session in about one week. Also reports a history of chronic hypomagnesemia. No fevers chills or chest pain. No syncope.  Has a colostomy. Has noticed decreased stool output since yesterday.Colostomy bag last changed yesterday afternoon.     Past Medical History:  Diagnosis Date  . CAD (coronary artery disease)    a. 09/2012 Cath: LM nl, LAD 95p, 52m LCX 96mOM2 50, RCA 100.  . Marland Kitchenholelithiasis   . Chronic diastolic CHF (congestive heart failure) (HCGrimes  . Collagen vascular disease (HCKampsville  . Esophageal stricture   . Exertional shortness of breath   . GERD (gastroesophageal reflux disease)   . H/O hiatal hernia   . Herniated disc   . History of pancytopenia   . Hyperlipidemia   . Hypertension   . Hypokalemia   . Leukopenia 2012   s/p bone marrow biopsy, Dr. PaMa Hillock. NSTEMI (non-ST elevated myocardial infarction) (HCSultana5/2014   "mild" (10/18/2012)  . Ovarian cancer (HCLittle River2016   chemo  . Pneumonia 2013; 08/2012   "one lung; double" (10/18/2012)  . Rheumatoid arthritis(714.0)   . Type I diabetes mellitus (HCPinetop Country [place]   "dx'd in 1957" (10/18/2012)     Patient Active Problem  List   Diagnosis Date Noted  . Pressure injury of skin 05/27/2016  . Altered mental status 05/26/2016  . Acute embolic stroke (HCGarden Ridge0144/06/4740. Hx of CABG 04/12/2016  . Encounter for antineoplastic chemotherapy 03/31/2016  . Heel ulcer (HCFort Montgomery08/01/2016  . Bile duct abnormality 12/09/2015  . Diabetic ulcer of left heel associated with type 1 diabetes mellitus (HCLeona08/10/2015  . Alkaline phosphatase elevation 11/17/2015  . Leukopenia 11/06/2015  . Weight loss 09/17/2015  . Liver abscess 09/03/2015  . Thrombosis of right internal jugular vein (HCValley Green05/08/2015  . Fatigue 07/17/2015  . Recurrent pleural effusion on right 07/01/2015  . Decubitus ulcer 05/19/2015  . Hyponatremia 05/15/2015  . Neuropathy (HCAlexandria01/13/2017  . Malignant neoplasm of ovary (HCRising Sun-Leb[place]01/08/2015  . Bilateral lower extremity edema 03/30/2015  . Hypomagnesemia 03/05/2015  . Diarrhea 02/26/2015  . Peritoneal carcinomatosis (HCGrifton10/18/2016  . Anemia 02/17/2015  . Adnexal mass 02/13/2015  . History of esophageal stricture 07/08/2014  . Dysphagia, pharyngoesophageal phase 07/08/2014  . Candida esophagitis (HCSalt Lake01/14/2016  . Cataracts, bilateral 01/13/2014  . Medicare annual wellness visit, subsequent 09/20/2013  . CAD (coronary artery disease) of artery bypass graft 11/05/2012  . Anemia, iron deficiency 10/09/2012  . CAD (coronary artery disease) 10/01/2012  . Hearing loss 07/06/2011  . Diabetes type 1, controlled (HCTynan03/10/2011  . Gastroesophageal reflux disease with stricture 07/06/2011  . GERD (gastroesophageal reflux disease) 04/05/2011  . Osteoporosis, unspecified 04/05/2011  . Hypertension 01/10/2011  . Hyperlipidemia 01/10/2011  . RA (rheumatoid arthritis) (HCBlue Bell09/01/2011  Past Surgical History:  Procedure Laterality Date  . ABDOMINAL HYSTERECTOMY  06/15/2015   Dx L/S, EXLAP TAH BSO omentectomy RSRx colostomy diaphragm resection stripping  . CARDIAC CATHETERIZATION  10/18/2012   "first one  was today" (10/18/2012)  . CATARACT EXTRACTION W/ INTRAOCULAR LENS IMPLANT Right 2010  . CHOLECYSTECTOMY  06/15/2015   combined case with ovarian cancer debulking  . COLON SURGERY    . CORONARY ARTERY BYPASS GRAFT N/A 10/19/2012   Procedure: CORONARY ARTERY BYPASS GRAFTING (CABG);  Surgeon: Melrose Nakayama, MD;  Location: [facility];  Service: Open Heart Surgery;  Laterality: N/A;  . ESOPHAGEAL DILATION     "3 or 4 times" (10/19/2011)  . ESOPHAGOGASTRODUODENOSCOPY  2012   Dr. Minna Merritts  . OSTOMY    . OVARY SURGERY     removal  . PERIPHERAL VASCULAR CATHETERIZATION N/A 03/02/2015   Procedure: Glori Luis Cath Insertion;  Surgeon: Algernon Huxley, MD;  Location: [place] [facility];  Service: Cardiovascular;  Laterality: N/A;  . [place]     Prior to Admission medications   Medication Sig Start Date End Date Taking? Authorizing Provider  Calcium-Vitamin D 600-200 MG-UNIT tablet Take 1 tablet by mouth 2 (two) times daily.    Yes Historical Provider, MD  cholecalciferol (VITAMIN D) 1000 units tablet Take 1,000 Units by mouth daily.   Yes Historical Provider, MD  enoxaparin (LOVENOX) 60 MG/0.6ML injection INJECT 0.6 ML (60 MG TOTAL) INTO THE SKIN EVERY 12 HOURS 07/22/16  Yes Lequita Asal, MD  folic acid (FOLVITE) 1 MG tablet TAKE 1 TABLET BY MOUTH ONCE A DAY 09/02/15  Yes Jackolyn Confer, MD  insulin lispro (HUMALOG) 100 UNIT/ML injection Inject 0.06 mLs (6 Units total) into the skin daily. Patient taking differently: Inject 0-85 Units into the skin daily. Via insulin pump 11/20/15  Yes Jackolyn Confer, MD  lidocaine-prilocaine (EMLA) cream APPLY 1 APPLICATION TOPICALLY AS NEEDED 1 HOUR PRIOR TO TREATMENT. COVER WITH PRESS N SEAL UNTIL TREATMENT TIME AS DIRECTED 06/15/16  Yes Lequita Asal, MD  loratadine (CLARITIN) 10 MG tablet Take 1 tablet by mouth daily as needed.   Yes Historical Provider, MD  losartan (COZAAR) 25 MG tablet Take 1 tablet (25 mg total) by mouth daily. 08/02/16  Yes  Minna Merritts, MD  magnesium oxide (MAG-OX) 400 (241.3 Mg) MG tablet TAKE 1 TABLET BY MOUTH ONCE DAILY 08/03/16  Yes Lequita Asal, MD  metoprolol tartrate (LOPRESSOR) 25 MG tablet Take 1 tablet (25 mg total) by mouth 2 (two) times daily. 04/12/16  Yes Minna Merritts, MD  ondansetron (ZOFRAN) 8 MG tablet TAKE 1 TABLET BY MOUTH EVERY 8 HOURS AS NEEDED FOR NAUSEA OR VOMITING 03/23/16  Yes Lequita Asal, MD  pantoprazole (PROTONIX) 40 MG tablet Take 1 tablet (40 mg total) by mouth 2 (two) times daily. 08/03/16  Yes Leone Haven, MD  potassium chloride SA (K-DUR,KLOR-CON) 20 MEQ tablet Take 1 tablet (20 mEq total) by mouth daily. 07/07/16  Yes Lequita Asal, MD  predniSONE (DELTASONE) 5 MG tablet Take 5 mg by mouth daily with breakfast.   Yes Historical Provider, MD  vitamin C (ASCORBIC ACID) 500 MG tablet Take 500 mg by mouth daily.   Yes Historical Provider, MD  zinc gluconate 50 MG tablet Take 50 mg by mouth daily.   Yes Historical Provider, MD  ONE TOUCH ULTRA TEST test strip  11/09/15   Historical Provider, MD  ranitidine (ZANTAC) 150 MG tablet Take 1  tablet (150 mg total) by mouth 2 (two) times daily. Patient not taking: Reported on 08/09/2016 04/13/16   Leone Haven, MD     Allergies Codeine and Latex   Family History  Problem Relation Age of Onset  . Diabetes Mother   . Arthritis Mother   . Diabetes Father   . Arthritis Father   . Bone cancer Sister     Social History Social History  Substance Use Topics  . Smoking status: Former Smoker    Packs/day: 1.00    Years: 30.00    Types: Cigarettes    Quit date: 06/11/1994  . Smokeless tobacco: Never Used  . Alcohol use No    Review of Systems  Constitutional:   No fever or chills.  ENT:   No sore throat. No rhinorrhea. Cardiovascular:   No chest pain. Respiratory:   No dyspnea or cough. Gastrointestinal:   Positive for generalized abdominal pain with vomiting and decreased colostomy output..   Genitourinary:   Chronic low urine output due to tumor compression of ureters bilaterally. Musculoskeletal:   Negative for focal pain or swelling Neurological:   Negative for headaches 10-point ROS otherwise negative.  ____________________________________________   PHYSICAL EXAM:  VITAL SIGNS: ED Triage Vitals  Enc Vitals Group     BP 08/09/16 1150 (!) 125/54     Pulse Rate 08/09/16 1150 96     Resp 08/09/16 1150 18     Temp 08/09/16 1150 98.3 F (36.8 C)     Temp Source 08/09/16 1150 Oral     SpO2 08/09/16 1150 100 %     Weight 08/09/16 1151 140 lb (63.5 kg)     Height 08/09/16 1151 5' 3"  (1.6 m)     Head Circumference --      Peak Flow --      Pain Score 08/09/16 1153 10     Pain Loc --      Pain Edu? --      Excl. in [place]? --     Vital signs reviewed, nursing assessments reviewed.   Constitutional:   Alert and oriented. Uncomfortable, not in distress. Eyes:   No scleral icterus. No conjunctival pallor. PERRL. EOMI.  No nystagmus. ENT   Head:   Normocephalic and atraumatic.   Nose:   No congestion/rhinnorhea. No septal hematoma   Mouth/Throat:   MMM, no pharyngeal erythema. No peritonsillar mass.    Neck:   No stridor. No SubQ emphysema. No meningismus. Hematological/Lymphatic/Immunilogical:   No cervical lymphadenopathy. Cardiovascular:   RRR. Symmetric bilateral radial and DP pulses.  No murmurs.  Respiratory:   Normal respiratory effort without tachypnea nor retractions. Breath sounds are clear and equal bilaterally. No wheezes/rales/rhonchi. Gastrointestinal:   Soft with generalized tenderness. Moderately distended. Not tight.. There is no CVA tenderness.  No rebound, rigidity, or guarding. Genitourinary:   deferred Musculoskeletal:   Normal range of motion in all extremities. No joint effusions.  No lower extremity tenderness.  No edema. Neurologic:   Normal speech and language.  CN 2-10 normal. Motor grossly intact. No gross focal neurologic  deficits are appreciated.  Skin:    Skin is warm, dry and intact. No rash noted.  No petechiae, purpura, or bullae.  ____________________________________________    LABS (pertinent positives/negatives) (all labs ordered are listed, but only abnormal results are displayed) Labs Reviewed  URINALYSIS, COMPLETE (UACMP) WITH MICROSCOPIC - Abnormal; Notable for the following:       Result Value   Color, Urine YELLOW (*)  APPearance HAZY (*)    Squamous Epithelial / LPF 0-5 (*)    All other components within normal limits  CBC WITH DIFFERENTIAL/PLATELET - Abnormal; Notable for the following:    RBC 3.69 (*)    Hemoglobin 11.6 (*)    HCT 34.2 (*)    RDW 15.0 (*)    Lymphs Abs 0.6 (*)    All other components within normal limits  COMPREHENSIVE METABOLIC PANEL - Abnormal; Notable for the following:    Chloride 97 (*)    Glucose, Bld 185 (*)    Albumin 3.4 (*)    Alkaline Phosphatase 206 (*)    All other components within normal limits  MAGNESIUM - Abnormal; Notable for the following:    Magnesium 1.4 (*)    All other components within normal limits  C DIFFICILE QUICK SCREEN W PCR REFLEX  GASTROINTESTINAL PANEL BY PCR, STOOL (REPLACES STOOL CULTURE)  URINE CULTURE  LIPASE, BLOOD  PHOSPHORUS  GLUCOSE, CAPILLARY   ____________________________________________   EKG    ____________________________________________    RADIOLOGY  Ct Abdomen Pelvis W Contrast  Result Date: 08/09/2016 CLINICAL DATA:  Stage IV ovarian carcinoma. Colostomy and diaphragmatic stripping 06/15/2015. Omentectomy and hysterectomy. No bowel movement for 24 hours. Lower abdominal pain EXAM: CT ABDOMEN AND PELVIS WITH CONTRAST TECHNIQUE: Multidetector CT imaging of the abdomen and pelvis was performed using the standard protocol following bolus administration of intravenous contrast. CONTRAST:  156m ISOVUE-300 IOPAMIDOL (ISOVUE-300) INJECTION 61% COMPARISON:  CT 06/10/2016 FINDINGS: Lower chest: Nodule  pleural implant in the posterior RIGHT lower lobe is increased measuring 10 mm (nodule 12, series 2) compared to 8 mm. Similar lesion measuring 21 mm slightly superior is also increased in size. Hepatobiliary: Multiple scalloped subcapsular lesions in the LEFT and RIGHT hepatic lobe increased slightly in size. For example lesion in the subcapsular RIGHT hepatic lobe measures 61 x 18 mm compared with 53 x 12 mm. There is a nodular implant within this fluid collection. Subcapsular lesion in the LEFT lateral hepatic lobe measuring 32 mm increased from 10 mm. Lesion along the falciform ligament inferiorly which extends into the hepatic parenchyma measures 25 mm compared to 25 mm. There is mild intra and extrahepatic duct dilatation following cholecystectomy which is similar. Pancreas: No pancreatic duct dilatation or inflammation. There is a lesion at the ampulla of the pancreas within the duodenum measuring 17 mm which compares to 14 mm on comparison exam (image 39, series 2). This is concerning for a metastatic lesion within the duodenum / ampulla. Spleen: Normal spleen Adrenals/urinary tract: Adrenal glands are normal. The degree of hydronephrosis seen on comparison exam is somewhat relieved. No evidence of high-grade obstruction. Stomach/Bowel: No oral contrast was administered. The stomach and duodenum appear normal. The proximal jejunum is fluid-filled in upper limits of normal 2.6 cm. The colon is decompressed. LEFT lower quadrant ostomy noted. There multiple nodules along the the cecal region and RIGHT lower quadrant. Large fluid collection within the RIGHT lower quadrant is increased in size significantly measuring 69 x 57 mm increased from 25 x 22 mm. Loop of small bowel approximates this enlarged fluid collection. This could represent a point obstruction. No pneumatosis or portal venous gas. Vascular/Lymphatic: Heavy calcification of the abdominal aorta no lymphadenopathy Reproductive: Post hysterectomy  Other: Additional fluid collections within the abdomen have increased. For example the 2 fluid collections adjacent to the spleen and liver measuring 34 and 38 mm compared with 18 and 21 mm. These fluid collections have fluid fluid level suggesting  internal proteinaceous material (image 22, series 2). Multiple new subcutaneous lesions in the ventral abdominal wall. Example lesion measures 15 mm on the LEFT on image 42, series 2. Similar lesions on the RIGHT. Musculoskeletal: No aggressive osseous lesion. IMPRESSION: 1. Interval increase and loculated fluid collections within the peritoneal space consistent with progression of peritoneal ovarian carcinoma metastasis. One large 7 cm collection in the RIGHT lower quadrant is increased significantly in size and may represent of point of small bowel obstruction. 2. The proximal stomach and duodenum are decompressed. The mid small bowel is mildly dilated. Differential would include ileus versus early obstruction. Concern for obstruction given the large intraperitoneal fluid collection described above. 3. Increase in subcapsular hepatic metastatic fluid collections. 4. Enlargement of rounded ampullary lesion in the second portion duodenum is concerning for metastatic lesion. Mild biliary duct dilatation is similar prior. Recommend correlation with bilirubin levels. 5. 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 Lesions increased. 6. Multiple subcutaneous lesions in the LEFT and RIGHT lower abdomen are new from prior and concerning for metastatic lesions. Potentially related to paracentesis. Electronically Signed   By: Suzy Bouchard M.D.   On: 08/09/2016 14:32   Dg Abd Portable 1 View  Result Date: 08/09/2016 CLINICAL DATA:  Followup small bowel obstruction. EXAM: PORTABLE ABDOMEN - 1 VIEW COMPARISON:  CT earlier same day FINDINGS: Nasogastric tube is in place with its tip in the stomach. There are a few visible loops of dilated fluid and  air-filled small intestine, but this is probably improved when compared to the previous CT. No worsening or new finding. Urinary tract contrast is present following CT. IMPRESSION: Nasogastric tube tip in the body of the stomach. Probably slightly improved small bowel obstruction pattern. Electronically Signed   By: Nelson Chimes M.D.   On: 08/09/2016 17:47    ____________________________________________   PROCEDURES Procedures  ____________________________________________   INITIAL IMPRESSION / ASSESSMENT AND PLAN / ED COURSE  Pertinent labs & imaging results that were available during my care of the patient were reviewed by me and considered in my medical decision making (see chart for details).  Patient with history of ovarian cancer and carcinomatosis on chemotherapy presents with abdominal pain and distention. Concern for bowel obstruction or perforation or intra-abdominal infection. We'll get a CT scan. IV morphine and Zofran. IV fluids.     Clinical Course as of Aug 10 1822  Tue Aug 09, 2016  1327 Pt had large volume emesis after attempting to drink PO contrast for CT. Will plan to perform CT without oral contrast due to intolerance.  [PS]  4765 Ct reviewed.  D/w surgery Burt Knack, who feels this is outside gen surg. Scope here.  D/w onc. Mike Gip, who will contact [facility] g[facility]/onc regarding dispo planning for [facility] admit vs [facility] transfer.   [PS]    Clinical Course User Index [PS] Carrie Mew, MD    ----------------------------------------- 6:22 PM on 08/09/2016 -----------------------------------------  Case discussed with Dr. Drue Flirt of [facility] gynecology oncology who accepts patient for transfer to [facility] for further management of SBO related to her carcinomatosis.   ____________________________________________   FINAL CLINICAL IMPRESSION(S) / ED DIAGNOSES  Final diagnoses:  SBO (small bowel obstruction)  Carcinomatosis ([place])  Hypomagnesemia      New Prescriptions    No medications on file     Portions of this note were generated with dragon dictation software. Dictation errors may occur despite best attempts at proofreading.    Carrie Mew, MD 08/09/16 [phone]

## 2016-08-09 NOTE — ED Triage Notes (Addendum)
Pt is currently receiving chemo for ovarian CA, has an ostomy .[name] States she has not passed a BM since yesterday morning and is having mid/lower abd pain with distention.. States she has not been passing urine like normal either. Pt has a portacath and is refusing to have blood draw or IV start unless we use her port.

## 2016-08-09 NOTE — ED Provider Notes (Signed)
Vital signs stable. Remains suitable for transfer for further management at [facility].   Carrie Mew, MD 08/09/16 2042

## 2016-08-09 NOTE — ED Notes (Signed)
Called CT to notify them that Dr. Joni Fears stated to do CT without pt drinking rest of contrast. Pt notified.

## 2016-08-09 NOTE — ED Notes (Signed)
Pt ambulatory to toilet. Urine hat placed in toilet to collect urine.

## 2016-08-09 NOTE — ED Notes (Signed)
Pt vomiting an entire bag of vomit. Pt informed to hold off on drinking contrast until nausea medication takes effect. Pt states morphine makes her sick.

## 2016-08-09 NOTE — ED Notes (Signed)
Pt states belly pain yest morning, cramping. Has colostomy bag. States yest afternoon had a loose BM and felt better. States cramping/tightness came back around 3am today. States decrease in urination. Has seen urologist, had scan done. Pt has ovarian cancer- scan showed cancer is pushing on bladder and kidneys. States kidney function has been good from blood work so far. States magnesium is low, has getting 4g magnesium once a week. Pt states takes doxyl for chemo- last about 2 weeks ago, due for it next Tuesday. States receives it every 28 days.   States vomiting before leaving house today. Denies fever.   Hx diabetes, ovarian cancer. Has insulin pump on. States will change insulin pump tomorrow. Changed colostomy bag yest afternoon.lots of air in bag with small amount of stool. quadruple bypass about 4 years ago.   States eating supper last night did not make pt feel worse.   Tender to touch lower abd. States SOB as well (worse when lying flat). States SOB when pain is worse.

## 2016-08-09 NOTE — ED Notes (Signed)
Husband's phone number: [phone] (cell) [phone] (home)  Please call w/ update when pt transferred

## 2016-08-10 ENCOUNTER — Telehealth: Payer: Self-pay | Admitting: Obstetrics and Gynecology

## 2016-08-10 ENCOUNTER — Ambulatory Visit: Payer: Medicare Other

## 2016-08-10 DIAGNOSIS — K56609 Unspecified intestinal obstruction, unspecified as to partial versus complete obstruction: Secondary | ICD-10-CM | POA: Insufficient documentation

## 2016-08-10 LAB — GLUCOSE, CAPILLARY
GLUCOSE-CAPILLARY: 262 mg/dL — AB (ref 65–99)
Glucose-Capillary: 171 mg/dL — ABNORMAL HIGH (ref 65–99)
Glucose-Capillary: 206 mg/dL — ABNORMAL HIGH (ref 65–99)

## 2016-08-10 LAB — URINE CULTURE: CULTURE: NO GROWTH

## 2016-08-10 MED ORDER — INSULIN ASPART 100 UNIT/ML ~~LOC~~ SOLN
6.0000 [IU] | Freq: Once | SUBCUTANEOUS | Status: AC
  Administered 2016-08-10: 6 [IU] via SUBCUTANEOUS
  Filled 2016-08-10: qty 6

## 2016-08-10 MED ORDER — INSULIN ASPART 100 UNIT/ML ~~LOC~~ SOLN
0.0000 [IU] | SUBCUTANEOUS | Status: DC
Start: 2016-08-10 — End: 2016-08-10
  Administered 2016-08-10: 3 [IU] via SUBCUTANEOUS
  Filled 2016-08-10: qty 3

## 2016-08-10 MED ORDER — ENOXAPARIN SODIUM 100 MG/ML ~~LOC~~ SOLN
SUBCUTANEOUS | Status: AC
  Administered 2016-08-10: 60 mg via SUBCUTANEOUS
  Filled 2016-08-10: qty 1

## 2016-08-10 MED ORDER — SODIUM CHLORIDE 0.9 % IV SOLN
INTRAVENOUS | Status: DC
  Administered 2016-08-10: 14:00:00 via INTRAVENOUS

## 2016-08-10 MED ORDER — ENOXAPARIN SODIUM 60 MG/0.6ML ~~LOC~~ SOLN
60.0000 mg | Freq: Once | SUBCUTANEOUS | Status: AC
  Administered 2016-08-10: 60 mg via SUBCUTANEOUS
  Filled 2016-08-10: qty 0.6

## 2016-08-10 MED ORDER — INSULIN GLARGINE 100 UNIT/ML ~~LOC~~ SOLN
15.0000 [IU] | Freq: Every day | SUBCUTANEOUS | Status: DC
  Administered 2016-08-10: 15 [IU] via SUBCUTANEOUS
  Filled 2016-08-10: qty 0.15

## 2016-08-10 NOTE — ED Notes (Signed)
Pt notes hard stool passing in colostomy bag.[name]

## 2016-08-10 NOTE — ED Notes (Signed)
Pt currently sleeping, audible snoring can be heard, equal and unlabored resp noted at this time.

## 2016-08-10 NOTE — ED Provider Notes (Signed)
-----------------------------------------   12:17 PM on 08/10/2016 -----------------------------------------   Blood pressure 127/86, pulse 85, temperature 98.3 F (36.8 C), temperature source Oral, resp. rate 18, height 5\' 3"  (1.6 m), weight 140 lb (63.5 kg), SpO2 99 %.  The patient had no acute events since last update.  Calm and cooperative at this time. Remains stable for transfer to [facility].   Darel Hong, MD 08/10/16 1218

## 2016-08-10 NOTE — Telephone Encounter (Signed)
I called Dr. Mike Gip at her request to discuss [name]. [name] has recently been evaluated and admitted to [facility] with symptomatic SBO. I reviewed the films. Based on comparison it appears that the loculated fluid collection has increased in size. This is the location of what appears to be a transition point. I suspect she has progression despite her most recent declining CA125 values. I would repeat CA125 this admission. I also recommended conservative management with bowel rest and NGT decompression. If her symptoms are refractory to conservative management then review with IR to determine if the fluid can be radiologically drained. This may improve her GI symptoms. Another option is GSU consult for consideration of surgical management vs interventional radiology for G-tube placement if her symptoms do not resolve with conservative management.    Lab Results  Component Value Date   CA125 44.0 (H) 07/19/2016   CA125 60.2 (H) 05/24/2016   CA125 76.8 (H) 05/03/2016   Dr. Mike Gip has my number and I am available as need to discuss further.   Gillis Ends, MD

## 2016-08-10 NOTE — ED Notes (Signed)
Continue to await bed assignment at [facility] patient and family are aware.Marland Kitchen

## 2016-08-10 NOTE — Progress Notes (Addendum)
Inpatient Diabetes Program Recommendations  AACE/ADA: New Consensus Statement on Inpatient Glycemic Control (2015)  Target Ranges:  Prepandial:   less than 140 mg/dL      Peak postprandial:   less than 180 mg/dL (1-2 hours)      Critically ill patients:  140 - 180 mg/dL   Results for [name], [name] (MRN [id number]) as of 08/10/2016 09:36  Ref. Range 08/09/2016 18:15 08/10/2016 06:13  Glucose-Capillary Latest Ref Range: 65 - 99 mg/dL 86 262 (H)   Review of Glycemic Control  Diabetes history: DM1 (makes no insulin; sensitive to insulin) Outpatient Diabetes medications: Insulin Pump with Humalog Current orders for Inpatient glycemic control: None  Inpatient Diabetes Program Recommendations: Insulin - Basal: Patient has DM1 and makes no insulin at all. Patient usually uses an insulin pump for DM control and per RN, patient does not have on insulin pump at this time. Therefore, patient will require SQ basal insulin. Please consider ordering Lantus 15 units Q24H starting now (based on 63 kg x 0.25 units and about 80% of total basal provided by insulin pump). Correction (SSI): Please consider using Glycemic Control order set and order CBGs with Novolog 0-9 units Q4H.  NOTE: Patient has Type 1 DM and makes no insulin at all. Patient usually uses and insulin pump with Humalog for DM control which provides basal, correction, and meal coverage insulin. Patient is followed by Dr. Gabriel Carina and last seen Dr. Gabriel Carina on 06/13/16.  Spoke with Colletta Maryland, RN and she reports that patient does NOT have insulin pump on at this time. Patient received Novolog 6 units SQ today at 6:49 am and has not received any other insulin since then.  Patient's insulin pump settings per Dr. Joycie Peek office note on 06/13/16 should be as follows:  She has a MiniMed Medtronic Paradigm 523 insulin pump. Basal rate  12:00 a.m. 0.5 units an hour 4:00 a.m. 0.625 units an hour 5:00 am 1.1 units an hour 11 am 1.3 units an hour 3:00 p.m. 0.5  units an hour total daily basal rate of 18.925 units.  Bolus settings 1:C ratio 12 AM 1:8.5, 4 PM 1:7 (Insulin to Carb Ratio at 12AM is 1 unit for every 8.5 grams; at 4PM 1 unit for every 7 grams) Sensitivity 46 (1 unit drops glucose 46 mg/dl) Target 120-120 Active insulin time 4 hours   Since insulin patient has been removed, patient is not receiving any insulin at all. Patient will require basal and correction insulin (even with being NPO).  Addendum 08/10/16 @ 10:15-Spoke with Dr. Jimmye Norman and discussed patient. Verbal orders received to order Lantus and Novolog as noted above.   Thanks, Barnie Alderman, RN, MSN, CDE Diabetes Coordinator Inpatient Diabetes Program [phone] (Team Pager)

## 2016-08-10 NOTE — ED Notes (Signed)
Report given to Kasey RN

## 2016-08-10 NOTE — ED Notes (Signed)
Pt moved over into hospital bed.

## 2016-08-10 NOTE — ED Notes (Signed)
Pt sleeping at this time, in NAD. Pt has equal and unlabored rise and fall of chest noted.

## 2016-08-10 NOTE — ED Notes (Addendum)
Received call from [place] pt has a bed assigned. [phone]. Pt and Network engineer informed. Report to be called when EMS arrives at [phone].

## 2016-08-12 ENCOUNTER — Telehealth: Payer: Self-pay | Admitting: Family Medicine

## 2016-08-12 NOTE — Telephone Encounter (Signed)
No, TCM patient was transferred to [facility].

## 2016-08-16 ENCOUNTER — Inpatient Hospital Stay: Payer: Medicare Other

## 2016-08-16 ENCOUNTER — Inpatient Hospital Stay: Payer: Medicare Other | Admitting: Hematology and Oncology

## 2016-08-16 NOTE — Progress Notes (Deleted)
[facility] day:  08/16/2016   Chief Complaint: [name] is a 71 y.o. female with stage IV ovarian cancer who is seen for assessment after interval hospitalization.  HPI:  The patient was last seen in the medical oncology clinic on 07/19/2016.  At that time, she was doing well.  She received cycle #2 Doxil with Neulasta support.  She tolerated her chemotherapy well.  CA125 decreased to 44.0.  She presented to the [facility] ER on 08/09/2016 with a small bowel obstruction.  Abdomen and pelvic CT scan on 08/09/2016 revealed interval increase and loculated fluid collections within the peritoneal space consistent with progression of peritoneal ovarian carcinoma metastasis.  There was one large 7 cm collection in the RIGHT lower quadrant which had increased significantly in size and may represent of point of small bowel obstruction.  The proximal stomach and duodenum were decompressed. The mid small bowel was mildly dilated. There was concern for obstruction given the large intraperitoneal fluid collection.  There was increase in subcapsular hepatic metastatic fluid collections.  There was enlargement of rounded ampullary lesion in the second portion duodenum concerning for metastatic lesion.  There was mild biliary duct dilatation (stable).  There was nodular pleural metastasis in the RIGHT lower lobe pleural space.  Lesions had increased.  There were multiple subcutaneous lesions in the LEFT and RIGHT lower abdomen new from prior and concerning for metastatic lesions (likely due to SQ Lovenox).  She was admitted to [facility] from 08/10/2016 - 08/14/2016.  She was managed conservatively.    Past Medical History:  Diagnosis Date  . CAD (coronary artery disease)    a. 09/2012 Cath: LM nl, LAD 95p, 68m LCX 95mOM2 50, RCA 100.  . Marland Kitchenholelithiasis   . Chronic diastolic CHF (congestive heart failure) ([place]  . Collagen vascular disease (HCF[place]  . Esophageal stricture    . Exertional shortness of breath   . GERD (gastroesophageal reflux disease)   . H/O hiatal hernia   . Herniated disc   . History of pancytopenia   . Hyperlipidemia   . Hypertension   . Hypokalemia   . Leukopenia 2012   s/p bone marrow biopsy, Dr. PaMa Hillock. NSTEMI (non-ST elevated myocardial infarction) (HCCarrollwood5/2014   "mild" (10/18/2012)  . Ovarian cancer (HCRee [place]2016   chemo  . Pneumonia 2013; 08/2012   "one lung; double" (10/18/2012)  . Rheumatoid arthritis(714.0)   . Type I diabetes mellitus (H[place] du [place]ien   "dx'd in 1957" (10/18/2012)    Past Surgical History:  Procedure Laterality Date  . ABDOMINAL HYSTERECTOMY  06/15/2015   Dx L/S, EXLAP TAH BSO omentectomy RSRx colostomy diaphragm resection stripping  . CARDIAC CATHETERIZATION  10/18/2012   "first one was today" (10/18/2012)  . CATARACT EXTRACTION W/ INTRAOCULAR LENS IMPLANT Right 2010  . CHOLECYSTECTOMY  06/15/2015   combined case with ovarian cancer debulking  . COLON SURGERY    . CORONARY ARTERY BYPASS GRAFT N/A 10/19/2012   Procedure: CORONARY ARTERY BYPASS GRAFTING (CABG);  Surgeon: StMelrose NakayamaMD;  Location: [facility] Service: Open Heart Surgery;  Laterality: N/A;  . ESOPHAGEAL DILATION     "3 or 4 times" (10/19/2011)  . ESOPHAGOGASTRODUODENOSCOPY  2012   Dr. IfMinna Merritts. OSTOMY    . OVARY SURGERY     removal  . PERIPHERAL VASCULAR CATHETERIZATION N/A 03/02/2015   Procedure: PoGlori Luisath Insertion;  Surgeon: JaAlgernon HuxleyMD;  Location: [facility] LAB;  Service: Cardiovascular;  Laterality: N/A;  . TUBAL LIGATION  1970    Family History  Problem Relation Age of Onset  . Diabetes Mother   . Arthritis Mother   . Diabetes Father   . Arthritis Father   . Bone cancer Sister     Social History:  reports that she quit smoking about 22 years ago. Her smoking use included Cigarettes. She has a 30.00 pack-year smoking history. She has never used smokeless tobacco. She reports that she does not drink alcohol or use  drugs.  She is accompanied by her husband today.  Allergies:  Allergies  Allergen Reactions  . Codeine Nausea And Vomiting  . Latex Rash    Current Medications: Current Outpatient Prescriptions  Medication Sig Dispense Refill  . Calcium-Vitamin D 600-200 MG-UNIT tablet Take 1 tablet by mouth 2 (two) times daily.     . cholecalciferol (VITAMIN D) 1000 units tablet Take 1,000 Units by mouth daily.    Marland Kitchen enoxaparin (LOVENOX) 60 MG/0.6ML injection INJECT 0.6 ML (60 MG TOTAL) INTO THE SKIN EVERY 12 HOURS 36 mL 2  . folic acid (FOLVITE) 1 MG tablet TAKE 1 TABLET BY MOUTH ONCE A DAY 90 tablet 3  . furosemide (LASIX) 80 MG tablet Take 0.5 tablets by mouth 2 (two) times daily.    . insulin lispro (HUMALOG) 100 UNIT/ML injection Inject 0.06 mLs (6 Units total) into the skin daily. (Patient taking differently: Inject 0-85 Units into the skin daily. Via insulin pump) 30 mL 3  . lidocaine-prilocaine (EMLA) cream APPLY 1 APPLICATION TOPICALLY AS NEEDED 1 HOUR PRIOR TO TREATMENT. COVER WITH PRESS N SEAL UNTIL TREATMENT TIME AS DIRECTED 30 g 1  . loratadine (CLARITIN) 10 MG tablet Take 1 tablet by mouth daily as needed.    Marland Kitchen losartan (COZAAR) 25 MG tablet Take 1 tablet (25 mg total) by mouth daily. 90 tablet 2  . magnesium oxide (MAG-OX) 400 (241.3 Mg) MG tablet TAKE 1 TABLET BY MOUTH ONCE DAILY 30 tablet 0  . metoprolol tartrate (LOPRESSOR) 25 MG tablet Take 1 tablet (25 mg total) by mouth 2 (two) times daily. 180 tablet 3  . ondansetron (ZOFRAN) 8 MG tablet TAKE 1 TABLET BY MOUTH EVERY 8 HOURS AS NEEDED FOR NAUSEA OR VOMITING 30 tablet 1  . ONE TOUCH ULTRA TEST test strip     . pantoprazole (PROTONIX) 40 MG tablet Take 1 tablet (40 mg total) by mouth 2 (two) times daily. 180 tablet 1  . potassium chloride SA (K-DUR,KLOR-CON) 20 MEQ tablet Take 1 tablet (20 mEq total) by mouth daily. 30 tablet 0  . predniSONE (DELTASONE) 5 MG tablet Take 5 mg by mouth daily with breakfast.    . ranitidine (ZANTAC) 150 MG  tablet Take 1 tablet (150 mg total) by mouth 2 (two) times daily. (Patient not taking: Reported on 08/09/2016) 180 tablet 1  . vitamin C (ASCORBIC ACID) 500 MG tablet Take 500 mg by mouth daily.    Marland Kitchen zinc gluconate 50 MG tablet Take 50 mg by mouth daily.     Current Facility-Administered Medications  Medication Dose Route Frequency Provider Last Rate Last Dose  . Tbo-Filgrastim (GRANIX) injection 300 mcg  300 mcg Subcutaneous Once Lequita Asal, MD       Facility-Administered Medications Ordered in Other Visits  Medication Dose Route Frequency Provider Last Rate Last Dose  . sodium chloride 0.9 % injection 10 mL  10 mL Intracatheter PRN Lequita Asal, MD      .  sodium chloride 0.9 % injection 10 mL  10 mL Intravenous PRN Lequita Asal, MD   10 mL at 04/13/15 1962    Review of Systems:  GENERAL:  Feels good.  No fever, chills or sweats.  Weight up 2 pounds. PERFORMANCE STATUS (ECOG):  1 HEENT:  No visual changes, sore throat, mouth sores or tenderness. Lungs:  No shortness of breath or cough.  No hemoptysis. Cardiac:  No chest pain, palpitations, orthopnea, or PND. GI:  No abdominal pain or distention.  No nausea, vomiting, diarrhea, melena or hematochezia. GU:  No urgency, frequency, dysuria, or hematuria. Musculoskeletal:  Severe rheumatoid arthritis (off MTX and on prednisone).  Osteoporosis.  No back pain. No muscle tenderness. Extremities:  No pain or swelling. Skin:  No increased bruising or bleeding.  No rashes or skin changes. Neuro:  Neuropathy (stable).  No headache, numbness or weakness, balance or coordination issues. Endocrine:  Diabetes on an insulin pump.  Occasional hot flashes.  Psych:  No mood changes, depression or anxiety. Pain: No pain. Review of systems:  All other systems reviewed and found to be negative.  Physical Exam: There were no vitals taken for this visit. GENERAL:  Well developed, well nourished woman sitting comfortably in the exam room  in no acute distress. MENTAL STATUS:  Alert and oriented to person, place and time. HEAD:  Curly gray hair.  Normocephalic, atraumatic, face symmetric, no Cushingoid features. EYES:  Gold rimmed glasses.  Blue eyes.  No conjunctivitis or scleral icterus.  ENT: Oropharynx clear without lesion. Tongue normal. Mucous membranes moist.  RESPIRATORY: Clear to auscultation without rales, wheezes or rhonchi. CARDIOVASCULAR: Regular rate and rhythm without murmur, rub or gallop. ABDOMEN: Soft, non-tender with active bowel sounds and no hepatosplenomegaly. No palpable nodularity or masses.  No shifting dullness.  Insulin pump.  Ostomy bag. SKIN: Bruising on abdomen s/p Lovenox injections. No rashes or ulcers. EXTREMITIES:  No edema, skin discoloration or tenderness. No palpable cords. LYMPH NODES: No palpable cervical, supraclavicular, axillary or inguinal adenopathy  NEUROLOGICAL: Appropriate. PSYCH: Appropriate.   Radiology studies: 02/13/2015:  Abdomen and pelvic CT revealed bilateral mass-like adnexal regions (right adnexal mass 5.6 x 5.0 cm and the left adnexa mass 10.3 x 6.0 x 9.9 cm).  There was a large amount of soft tissue throughout the peritoneal cavity involving the omentum and other peritoneal surfaces.  There was a small volume ascites. There was a peripheral 3.3 x 1.9 cm low-attenuation lesion overlying the right lobe of the liver, likely representing a serosal implant. There was 1.4 x 2.2 cm ill-defined peripheral lesion within the inferior aspect of segment 6 adjacent to the ampulla in the duodenum.  04/28/2015:  Abdominal and pelvic CT revealed decreasing bilateral ovarian masses.   The left adnexal mass measured 4.0 x 6.1 cm (previously 5.0 x 8.5 cm). The right ovary measured 3.8 x 4.9 cm (previously 4.7 x 5.6 cm).  There was improved peritoneal carcinomatosis.  There was a small amount of ascites.  The hepatic dome lesion was stable. The previously seen right hepatic lobe lesion  was not well-visualized.  There was a small left pleural effusion and trace right pleural effusion.  The nodular lesion at the ampulla of Vater, extending into the duodenum was stable.  There was no biliary ductal dilatation. 08/01/2015:  Rght upper extremity ultrasound revealed a near occlusive thrombus within the central portion of the right internal jugular vein and central portion of the right subclavian vein. 09/01/2015:  Chest, abdomen, and pelvic  CT revealed continued decrease in perihepatic fluid collection contiguous with the right pleural space with percutaneous drain.  09/11/2015:  Chest, abdomen, and pelvic CT revealed a residual versus recurrent fluid collection posterior to the right hepatic lobe (2.6 x 1.1 x 4.0 cm).   10/13/2015:  Chest, abdomen, and pelvic CT revealed resolution of the empyema. 02/15/2016:  Chest, abdomen, and pelvic CT revealed multiple soft tissue nodules throughout the pelvis, small bowel mesenteric and peritoneum and, concerning for peritoneal metastatic disease.  There was soft tissue irregularity within the left upper quadrant.  There was mild right hydronephrosis (etiology unclear).  There was interval resolution of previously described right pleural based fluid and gas collection. There was a pleural based nodule within the right lower hemi-thorax concerning for pleural based metastasis.  There were small bilateral pleural effusions.  There was pulmonary nodularity, predominately within the left upper lobe (metastatic or infectious/inflammatory etiology).  There was slightly increased mediastinal adenopathy (infectious/inflammatory or metastatic).  There was a low attenuation lesion within the left hepatic lobe (complicated fluid within the fissure or metastatic disease). 03/08/2016:  Abdomen and pelvic CT revealed mild progression of peritoneal disease in the abdomen.  There was interval increase in loculated fluid around the lateral segment left liver, stomach, and  spleen.  There was persistent soft tissue lesion at the level of the ampulla with mild intra and extrahepatic biliary duct dilatation.  There was persistent intrahepatic and capsular metastatic disease involving the liver. 05/26/2016:  Head MRI revealed multiple small areas of acute infarct involving the occipital parietal lobe bilaterally and the left lateral cerebellum,  consistent with posterior circulation emboli.  06/10/2016:  Adomen and pelvic CT revealed progression of peritoneal carcinomatosis predominantly in the perihepatic space, bilateral lower quadrants and pelvis.  There was worsening bilateral obstructive uropathy due to malignant involvement of the pelvic ureters.  There was stable small pleural/subpleural nodules at the right lung base.  There was stable small left pleural effusion.  There was decreased small volume perihepatic ascites.    No visits with results within 3 Day(s) from this visit.  Latest known visit with results is:  Admission on 08/09/2016, Discharged on 08/10/2016  Component Date Value Ref Range Status  . Color, Urine 08/09/2016 YELLOW* YELLOW Final  . APPearance 08/09/2016 HAZY* CLEAR Final  . Specific Gravity, Urine 08/09/2016 1.012  1.005 - 1.030 Final  . pH 08/09/2016 5.0  5.0 - 8.0 Final  . Glucose, UA 08/09/2016 NEGATIVE  NEGATIVE mg/dL Final  . Hgb urine dipstick 08/09/2016 NEGATIVE  NEGATIVE Final  . Bilirubin Urine 08/09/2016 NEGATIVE  NEGATIVE Final  . Ketones, ur 08/09/2016 NEGATIVE  NEGATIVE mg/dL Final  . Protein, ur 08/09/2016 NEGATIVE  NEGATIVE mg/dL Final  . Nitrite 08/09/2016 NEGATIVE  NEGATIVE Final  . Leukocytes, UA 08/09/2016 NEGATIVE  NEGATIVE Final  . RBC / HPF 08/09/2016 0-5  0 - 5 RBC/hpf Final  . WBC, UA 08/09/2016 0-5  0 - 5 WBC/hpf Final  . Bacteria, UA 08/09/2016 NONE SEEN  NONE SEEN Final  . Squamous Epithelial / LPF 08/09/2016 0-5* NONE SEEN Final  . Mucous 08/09/2016 PRESENT   Final  . Hyaline Casts, UA 08/09/2016 PRESENT    Final  . WBC 08/09/2016 4.7  3.6 - 11.0 K/uL Final  . RBC 08/09/2016 3.69* 3.80 - 5.20 MIL/uL Final  . Hemoglobin 08/09/2016 11.6* 12.0 - 16.0 g/dL Final  . HCT 08/09/2016 34.2* 35.0 - 47.0 % Final  . MCV 08/09/2016 92.6  80.0 - 100.0 fL Final  .  MCH 08/09/2016 31.5  26.0 - 34.0 pg Final  . MCHC 08/09/2016 34.0  32.0 - 36.0 g/dL Final  . RDW 08/09/2016 15.0* 11.5 - 14.5 % Final  . Platelets 08/09/2016 311  150 - 440 K/uL Final  . Neutrophils Relative % 08/09/2016 73  % Final  . Neutro Abs 08/09/2016 3.5  1.4 - 6.5 K/uL Final  . Lymphocytes Relative 08/09/2016 13  % Final  . Lymphs Abs 08/09/2016 0.6* 1.0 - 3.6 K/uL Final  . Monocytes Relative 08/09/2016 12  % Final  . Monocytes Absolute 08/09/2016 0.5  0.2 - 0.9 K/uL Final  . Eosinophils Relative 08/09/2016 1  % Final  . Eosinophils Absolute 08/09/2016 0.0  0 - 0.7 K/uL Final  . Basophils Relative 08/09/2016 1  % Final  . Basophils Absolute 08/09/2016 0.0  0 - 0.1 K/uL Final  . Specimen Description 08/09/2016 URINE, RANDOM   Final  . Special Requests 08/09/2016 NONE   Final  . Culture 08/09/2016    Final                   Value:NO GROWTH Performed at [facility], [place], [place], [place] [address]   . Report Status 08/09/2016 08/10/2016 FINAL   Final  . C Diff antigen 08/09/2016 NEGATIVE  NEGATIVE Final  . C Diff toxin 08/09/2016 NEGATIVE  NEGATIVE Final  . C Diff interpretation 08/09/2016 No C. difficile detected.   Final  . Campylobacter species 08/09/2016 NOT DETECTED  NOT DETECTED Final  . Plesimonas shigelloides 08/09/2016 NOT DETECTED  NOT DETECTED Final  . Salmonella species 08/09/2016 NOT DETECTED  NOT DETECTED Final  . Yersinia enterocolitica 08/09/2016 NOT DETECTED  NOT DETECTED Final  . Vibrio species 08/09/2016 NOT DETECTED  NOT DETECTED Final  . Vibrio cholerae 08/09/2016 NOT DETECTED  NOT DETECTED Final  . Enteroaggregative E coli (EAEC) 08/09/2016 NOT DETECTED  NOT DETECTED Final  . Enteropathogenic  E coli (EPEC) 08/09/2016 NOT DETECTED  NOT DETECTED Final  . Enterotoxigenic E coli (ETEC) 08/09/2016 NOT DETECTED  NOT DETECTED Final  . Shiga like toxin producing E coli * 08/09/2016 NOT DETECTED  NOT DETECTED Final  . Shigella/Enteroinvasive E coli (EI* 08/09/2016 NOT DETECTED  NOT DETECTED Final  . Cryptosporidium 08/09/2016 NOT DETECTED  NOT DETECTED Final  . Cyclospora cayetanensis 08/09/2016 NOT DETECTED  NOT DETECTED Final  . Entamoeba histolytica 08/09/2016 NOT DETECTED  NOT DETECTED Final  . Giardia lamblia 08/09/2016 NOT DETECTED  NOT DETECTED Final  . Adenovirus F40/41 08/09/2016 NOT DETECTED  NOT DETECTED Final  . Astrovirus 08/09/2016 NOT DETECTED  NOT DETECTED Final  . Norovirus GI/GII 08/09/2016 NOT DETECTED  NOT DETECTED Final  . Rotavirus A 08/09/2016 NOT DETECTED  NOT DETECTED Final  . Sapovirus (I, II, IV, and V) 08/09/2016 NOT DETECTED  NOT DETECTED Final  . Sodium 08/09/2016 135  135 - 145 mmol/L Final  . Potassium 08/09/2016 3.7  3.5 - 5.1 mmol/L Final  . Chloride 08/09/2016 97* 101 - 111 mmol/L Final  . CO2 08/09/2016 27  22 - 32 mmol/L Final  . Glucose, Bld 08/09/2016 185* 65 - 99 mg/dL Final  . BUN 08/09/2016 17  6 - 20 mg/dL Final  . Creatinine, Ser 08/09/2016 0.83  0.44 - 1.00 mg/dL Final  . Calcium 08/09/2016 9.5  8.9 - 10.3 mg/dL Final  . Total Protein 08/09/2016 7.1  6.5 - 8.1 g/dL Final  . Albumin 08/09/2016 3.4* 3.5 - 5.0 g/dL Final  . AST 08/09/2016  32  15 - 41 U/L Final  . ALT 08/09/2016 49  14 - 54 U/L Final  . Alkaline Phosphatase 08/09/2016 206* 38 - 126 U/L Final  . Total Bilirubin 08/09/2016 0.7  0.3 - 1.2 mg/dL Final  . GFR calc non Af Amer 08/09/2016 >60  >60 mL/min Final  . GFR calc Af Amer 08/09/2016 >60  >60 mL/min Final   Comment: (NOTE) The eGFR has been calculated using the CKD EPI equation. This calculation has not been validated in all clinical situations. eGFR's persistently <60 mL/min signify possible Chronic Kidney Disease.   .  Anion gap 08/09/2016 11  5 - 15 Final  . Lipase 08/09/2016 20  11 - 51 U/L Final  . Magnesium 08/09/2016 1.4* 1.7 - 2.4 mg/dL Final  . Phosphorus 08/09/2016 4.1  2.5 - 4.6 mg/dL Final  . Glucose-Capillary 08/09/2016 86  65 - 99 mg/dL Final  . Glucose-Capillary 08/10/2016 262* 65 - 99 mg/dL Final  . Glucose-Capillary 08/10/2016 171* 65 - 99 mg/dL Final  . Glucose-Capillary 08/10/2016 206* 65 - 99 mg/dL Final    Assessment:  [name] is a 71 y.o. female with stage IV ovarian cancer.  She presented with abdominal discomfort and bloating.  Omental biopsy on 02/23/2015 revealed metastatic high grade serous carcinoma, consistent with gynecologic origin.   She was initially diagnosed with clinical stage IIIC (T3cN1Mx).  CA125 was 707 on 02/17/2015.  Abdomen and pelvic CT scan on 02/13/2015 revealed bilateral mass-like adnexal regions (right adnexal mass 5.6 x 5.0 cm and the left adnexa mass 10.3 x 6.0 x 9.9 cm).  There was a large amount of soft tissue throughout the peritoneal cavity involving the omentum and other peritoneal surfaces.  There was a small volume ascites. There was a peripheral 3.3 x 1.9 cm low-attenuation lesion overlying the right lobe of the liver, likely representing a serosal implant. There was 1.4 x 2.2 cm ill-defined peripheral lesion within the inferior aspect of segment 6 adjacent to the ampulla in the duodenum.   She received 4 cycles of neoadjuvant carboplatin and Taxol (03/05/2015 - 05/22/2015).  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  She had loose stools on oral magnesium.  She was initially on Neurontin then switched Lyrica with cycle #3.  Cycle #4 was notable for neutropenia (ANC 300) requiring GCSF x 3 days.    She received tamoxifen from 02/25/2016 - 03/14/2016.  She received 4 cycles of carboplatin and gemcitabine (03/21/2016 - 05/24/2016) with GCSF/Neulasta support.  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  CA125 was 802.9 on  03/30/2015, 567.9 on 04/13/2015, 168.8 on 05/15/2015, 85.2 on 07/17/2015, 68.6 on 07/28/2015, 34.5 on 09/17/2015, 20.7 on 11/06/2015, 49 on 01/22/2016, 106.1 on 02/22/2016, 138.8 on 03/07/2016, 220.8 on 03/21/2016, 152.8 on 04/11/2016, 125.6 on 04/18/2016, 76.8 on 05/03/2016, 60.2 on 05/24/2016, and 44 on 07/19/2016.  She underwent exploratory laparotomy, lysis of adhesions, total abdominal hysterectomy with bilateral salpingo-oophorectomy, infracolic omentectomy, optimal tumor debulking(< 1 cm), recto-sigmoid resection with creation of end colostomy, cholecystectomy, mobilization of splenic flexure and liver with diaphragmatic stripping on 06/15/2015. The right diaphragm was cleared of tumor. During dissection, the diaphragm was entered and closed with sutures.   She had a recurrent right sided pleural effusion.  She underwent thoracentesis of 650 cc on post-operative day 3.  She was admitted to [facility] on 07/01/2015 and 07/07/2015 for recurrent shortness of breath.  She underwent 2 additional thoracenteses (1.1 L on 07/02/2015 and 850 cc on 07/08/2015).  Cytology was negative x 2.  Bilateral lower extremity duplex on 07/03/2015 was negative.  Echo revealed an EF of 55-60% on 07/08/2015 and 60-65% on 05/27/2016.    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 She was on Lovenox 60 mg twice a day.  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  She was admitted to [facility] from 07/29/2015 - 08/10/2015 with a right-sided empyema and liver abscess. She underwent CT-guided placement of a liver abscess drain on 07/30/2015.  Liver abscess culture grew out group B strep and Enterobacter which was sensitive to Zosyn. She was transitioned to ertapenem Colbert Ewing) prior to discharge.  She was readmitted to [facility] on 09/17/2015.  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  Head MRI on 05/26/2016 revealed multiple small areas of acute infarct involving the occipital parietal lobe bilaterally and the left lateral cerebellum,  consistent with posterior circulation emboli.  Work-up included a negative carotid ultrasound and echocardiogram.  She has severe rheumatoid arthritis.  Methotrexate and Enbrel were initially on hold.  She has a normocytic anemia.  Work-up on 02/17/2015 and 05/24/2016 revealed a normal ferritin, B12, folate, TSH.  She denies any melena or hematochezia.    She has anemia due to chronic disease. She received 1 unit PRBCs during her admission at [facility]. She denies any melena or hematochezia. She has diabetes and is on an insulin pump.  She has had persistent neutropenia felt secondary to her rheumatoid arthritis.  Folate and MMA were normal.  TSH was 6.13 (high) with a free T4 of 1.17 (0.61-1.12).  She began methotrexate (10 mg a week) and prednisone (5 mg a day) for severe rheumatoid arthritis on 12/17/2015.  She remains on prednisone alone (5 mg a day).  Bone marrow aspirate and biopsy on 06/09/2010 revealed a hypercellular marrow (70%) with no evidence of dysplasia or malignancy.  Flow cytometry was negative.  Cytogenetics were normal (46,XX).  FISH studies were negative for MDS.  Bone marrow aspirate and biopsy on 12/03/2015 revealed a normocellular to mildy hypercellular marrow for age (40%) with left shifted myelopoiesis, non specific dyserythropoiesis and mild megakaryocytic atypia with no increase in blasts.  There were multiple small nonspecific lymphoid aggregates (favor reactive). There was no increase in reticulin.  There was decreased myeloid cells (37%) with left shifted maturation and 1% atypical myelod blasts.  There was relatively increased monocytic cells (11%), relatively increased lymphoid cells (36%), and relatively increased eosinophils  (6%).  Cytogenetics were normal (69, XX).  SNP microarray was normal.  Adomen and pelvic CT scan on 06/10/2016 revealed progression of peritoneal carcinomatosis predominantly in the perihepatic space, bilateral lower quadrants and pelvis.  There was worsening bilateral obstructive uropathy due to malignant involvement of the pelvic ureters.  There was stable small pleural/subpleural nodules at the right lung base.  There was stable small left pleural effusion.  There was decreased small volume perihepatic ascites.   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  She receives IV magnesium weekly.  She is s/p 2 cycle of Doxil (06/21/2016 - 07/19/2016) with Neulasta support.  She tolerated her chemotherapy well.  Symptomatically, she feels good.  Blood pressure is slightly low on 3 blood pressure medications.  Counts are good.  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  Plan: 1.  Labs today:  CBC with diff, BMP, Mg, CA125. 2.  Cycle #2 Doxil today. 3.  RTC tomorrow for Neulasta. 4.  Magnesium sulfate 4 gm IV today. 5.  Continue Lovenox. 6.  Check orthostatics.  Phone follow-up with Dr. Rockey Situ today re: blood pressure. 7.  RTC weekly for labs (BMP, Mg) +/- IV Mg 8.  RTC in 4 weeks for MD assessment, labs (CBC with diff, CMP, Mg, CA125), and cycle #3 Doxil.   Lequita Asal, MD  08/16/2016, 4:55 AM

## 2016-08-17 ENCOUNTER — Other Ambulatory Visit: Payer: Self-pay | Admitting: *Deleted

## 2016-08-17 ENCOUNTER — Other Ambulatory Visit: Payer: Self-pay | Admitting: Hematology and Oncology

## 2016-08-17 ENCOUNTER — Inpatient Hospital Stay: Payer: Medicare Other

## 2016-08-17 ENCOUNTER — Inpatient Hospital Stay (HOSPITAL_BASED_OUTPATIENT_CLINIC_OR_DEPARTMENT_OTHER): Payer: Medicare Other | Admitting: Obstetrics and Gynecology

## 2016-08-17 ENCOUNTER — Telehealth: Payer: Self-pay | Admitting: *Deleted

## 2016-08-17 VITALS — BP 115/67 | HR 98 | Temp 97.0°F | Ht 63.0 in | Wt 140.4 lb

## 2016-08-17 DIAGNOSIS — Z66 Do not resuscitate: Secondary | ICD-10-CM

## 2016-08-17 DIAGNOSIS — Z90710 Acquired absence of both cervix and uterus: Secondary | ICD-10-CM | POA: Diagnosis not present

## 2016-08-17 DIAGNOSIS — C801 Malignant (primary) neoplasm, unspecified: Principal | ICD-10-CM

## 2016-08-17 DIAGNOSIS — I10 Essential (primary) hypertension: Secondary | ICD-10-CM

## 2016-08-17 DIAGNOSIS — M069 Rheumatoid arthritis, unspecified: Secondary | ICD-10-CM | POA: Diagnosis not present

## 2016-08-17 DIAGNOSIS — C786 Secondary malignant neoplasm of retroperitoneum and peritoneum: Secondary | ICD-10-CM | POA: Diagnosis not present

## 2016-08-17 DIAGNOSIS — I5032 Chronic diastolic (congestive) heart failure: Secondary | ICD-10-CM | POA: Diagnosis not present

## 2016-08-17 DIAGNOSIS — E785 Hyperlipidemia, unspecified: Secondary | ICD-10-CM | POA: Diagnosis not present

## 2016-08-17 DIAGNOSIS — Z90722 Acquired absence of ovaries, bilateral: Secondary | ICD-10-CM | POA: Diagnosis not present

## 2016-08-17 DIAGNOSIS — C569 Malignant neoplasm of unspecified ovary: Secondary | ICD-10-CM

## 2016-08-17 DIAGNOSIS — C7800 Secondary malignant neoplasm of unspecified lung: Secondary | ICD-10-CM | POA: Diagnosis not present

## 2016-08-17 DIAGNOSIS — K219 Gastro-esophageal reflux disease without esophagitis: Secondary | ICD-10-CM | POA: Diagnosis not present

## 2016-08-17 DIAGNOSIS — I2510 Atherosclerotic heart disease of native coronary artery without angina pectoris: Secondary | ICD-10-CM

## 2016-08-17 DIAGNOSIS — I252 Old myocardial infarction: Secondary | ICD-10-CM

## 2016-08-17 DIAGNOSIS — Z79899 Other long term (current) drug therapy: Secondary | ICD-10-CM

## 2016-08-17 DIAGNOSIS — I6340 Cerebral infarction due to embolism of unspecified cerebral artery: Secondary | ICD-10-CM | POA: Diagnosis not present

## 2016-08-17 DIAGNOSIS — Z87891 Personal history of nicotine dependence: Secondary | ICD-10-CM

## 2016-08-17 DIAGNOSIS — Z794 Long term (current) use of insulin: Secondary | ICD-10-CM

## 2016-08-17 LAB — CBC WITH DIFFERENTIAL/PLATELET
Basophils Absolute: 0 10*3/uL (ref 0–0.1)
Basophils Relative: 1 %
Eosinophils Absolute: 0 10*3/uL (ref 0–0.7)
Eosinophils Relative: 0 %
HCT: 29.6 % — ABNORMAL LOW (ref 35.0–47.0)
Hemoglobin: 10.2 g/dL — ABNORMAL LOW (ref 12.0–16.0)
Lymphocytes Relative: 11 %
Lymphs Abs: 0.5 10*3/uL — ABNORMAL LOW (ref 1.0–3.6)
MCH: 31.3 pg (ref 26.0–34.0)
MCHC: 34.3 g/dL (ref 32.0–36.0)
MCV: 91.1 fL (ref 80.0–100.0)
Monocytes Absolute: 0.4 10*3/uL (ref 0.2–0.9)
Monocytes Relative: 10 %
Neutro Abs: 3.7 10*3/uL (ref 1.4–6.5)
Neutrophils Relative %: 78 %
Platelets: 302 10*3/uL (ref 150–440)
RBC: 3.25 MIL/uL — ABNORMAL LOW (ref 3.80–5.20)
RDW: 14.5 % (ref 11.5–14.5)
WBC: 4.7 10*3/uL (ref 3.6–11.0)

## 2016-08-17 LAB — COMPREHENSIVE METABOLIC PANEL
ALT: 24 U/L (ref 14–54)
AST: 20 U/L (ref 15–41)
Albumin: 3.3 g/dL — ABNORMAL LOW (ref 3.5–5.0)
Alkaline Phosphatase: 132 U/L — ABNORMAL HIGH (ref 38–126)
Anion gap: 8 (ref 5–15)
BUN: 13 mg/dL (ref 6–20)
CO2: 25 mmol/L (ref 22–32)
Calcium: 9.1 mg/dL (ref 8.9–10.3)
Chloride: 99 mmol/L — ABNORMAL LOW (ref 101–111)
Creatinine, Ser: 0.67 mg/dL (ref 0.44–1.00)
GFR calc Af Amer: >60 mL/min (ref >60)
GFR calc non Af Amer: >60 mL/min (ref >60)
Glucose, Bld: 227 mg/dL — ABNORMAL HIGH (ref 65–99)
Potassium: 4.1 mmol/L (ref 3.5–5.1)
Sodium: 132 mmol/L — ABNORMAL LOW (ref 135–145)
Total Bilirubin: 0.2 mg/dL — ABNORMAL LOW (ref 0.3–1.2)
Total Protein: 6.9 g/dL (ref 6.5–8.1)

## 2016-08-17 LAB — MAGNESIUM: Magnesium: 1.3 mg/dL — ABNORMAL LOW (ref 1.7–2.4)

## 2016-08-17 NOTE — Telephone Encounter (Signed)
Patient informed to come tomorrow at 1415 for mag infusion voiced understanding.

## 2016-08-17 NOTE — Progress Notes (Signed)
Patient here for follow up. She is feeling "extremely weak since recent hospitalization. Appetite normal but is on a restricted diet, sleep normal.

## 2016-08-17 NOTE — Progress Notes (Signed)
Gynecologic Oncology Interval Visit   Referring Provider: Lequita Asal, MD  Additional Care Provider: Dr. Harvest Dark.  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    Subjective:  [name] is a 71 y.o. 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  She was seen in [facility] ER on 08/09/2016 with concerns for SBO and transferred to [facility] on 08/10/2016. She had a transition point in the RLQ associated with a fluid filled cystic lesion.   08/09/2016 CT scan A/P: Interval increase and loculated fluid collections within the peritoneal space consistent with progression of peritoneal ovarian carcinoma metastasis. One large 7 cm collection in the RIGHT lower quadrant is increased significantly in size and may represent of point of small bowel obstruction; Increase in subcapsular hepatic metastatic fluid collections; Enlargement of rounded ampullary lesion in the second portion duodenum is concerning for metastatic lesion; nodular pleural metastasis in the RIGHT lower lobe pleural space. Lesions increased.  She was managed conservatively and discharged home on 08/14/2016. During that hospitalization we had a goals of care conversation. She is DNAR and has advanced care directives in place. Her husband is her HCPOA. She is interested in more therapy.   Lab Results  Component Value Date   CA125 44.0 (H) 07/19/2016   CA125 60.2 (H) 05/24/2016   CA125 76.8 (H) 05/03/2016     HPI: The patient presented to the emergency room at [facility] on 02/13/2015 with a 2-3 week history of diffuse abdominal discomfort and bloating.    Abdomen and pelvic CT scan on 02/13/2015 revealed bilateral mass-like  adnexal regions. The right adnexal region measured approximately 5.6 x 5.0 cm posterior to the uterus, and the left adnexa demonstrated a large mass measuring approximately 10.3 x 6.0 x 9.9 cm, with heterogeneous internal areas of enhancement and cystic change. This appeared intimately associated with the adjacent proximal sigmoid colon. There was a large amount of soft tissue throughout the peritoneal cavity involving the omentum and other peritoneal surfaces, compatible with widespread intraperitoneal metastatic disease. There was a small volume of presumably malignant ascites. There was a peripheral low-attenuation lesion overlying the right lobe of the liver measuring approximately 3.3 x 1.9 cm, favored to represent a serosal implant. There was a similar appearing 1.4 x 2.2 cm ill-defined peripheral lesion within the inferior aspect of segment 6 adjacent to the ampulla in the duodenum. There was no lytic or blastic lesions. She had a chronic T12 compression fracture.  She was seen in consultation from Dr. Mike Gip for bilateral ovarian masses with associated peritoneal metastatic disease.   02/23/2015 CT biopsy high grade serous carcinoma.  Decision was made for neoadjuvant chemotherapy with carboplatin/taxol. She has had 4 cycles of carboplatin (AUC 5) /taxol (175 mg per sq M) with Dr Mike Gip, last 12/19.  CA125 dropped from 707 in 10/16 to 168.8 in 05/15/2015.   She stopped MTX and Enbarel she had been taking for rheumatoid arthritis while on chemotherapy and joint symptoms have been in good control.  Uses Prednisone 37m qd prn and has taken this the last three days.   CT scan 04/27/16 Heterogeneous left adnexal mass measures approximately 4.0 x 6.1 cm, previously approximately 5.0 x 8.5 cm when remeasured. Heterogeneous right ovary measures approximately 3.8 x 4.9 cm, roughly 4.7 x  5.6 cm on the prior exam. Uterus is visualized. Other: Omental caking has improved in the interval but has not fully  resolved. Residual scattered areas of omental nodularity and thickening persist. Small ascites, decreased.   Hepatobiliary: Low-attenuation lesion in the periphery of the hepatic dome measures 3.1 cm, stable. A lesion previously seen off the posterior right hepatic lobe is not readily visualized. There is a small amount of fluid in Morison's pouch. Numerous stones are seen in the gallbladder. No biliary ductal dilatation.  IMPRESSION: 1. Interval response to therapy as evidenced by decrease in size of bilateral ovarian masses and improved, but not resolved, peritoneal carcinomatosis. 2. Hepatic dome lesion is unchanged. Previously seen right hepatic lobe lesion is not well-visualized. 3. Small ascites and small left pleural effusion. Trace right pleural effusion. 4. Cholelithiasis. 5. Nodular lesion at the ampulla of Vater, extending into the duodenum, stable.  On 06/15/2015 she underwent diagnostic laparoscopy, exploratory laparotomy, lysis of adhesions (including enterolysis for > 45 minutes), total abdominal hysterectomy with bilateral salpingo-oophorectomy, infracolic omentectomy, optimal tumor debulking, recto-sigmoid resection with creation of end colostomy, mobilization of the splenic flexure  mobilization of the liver with diaphragmatic stripping and resection of diaphragm due to disease, repair of diaphragmatic defect and cholecystectomy. Her initial postoperative course was unremarkable.   PATHOLOGY: DIAGNOSIS  A. Uterus, bilateral ovaries and fallopian tubes, hysterectomy and bilateral salpingo-oophorectomy:  Left ovary: Residual high grade serous carcinoma, 2.5 cm. Right ovary: Residual high grade serous carcinoma, 1.5 cm.  Right and left fallopian tubes, free of tumor. See comment.  Uterus:  Residual high grade serous carcinoma involving uterine serosa and myometrium. Endometrium: Atrophic endometrium Cervix: No pathologic diagnosis.   See synoptic report for additional  details.  Comment: Immunostains performed on block A17 ( right tube) show a small segment of p53 positive tubal epithelium. However, this focus of epithelium is cytological unremarkable and shows no increase in Ki-67 labelling or P16 staining. It is therefore classified as a p53 signature lesion rather than a serous tubal intraepithelial carcinoma.    B. Rectomsigmoid colon and partial left ovary, resection:   Residual high grade serous carcinoma involving colonic serosa and subserosa, fallopian tube and ovary.   C. Omentum, omentectomy:   Residual high grade serous carcinoma, tumor size > 2 cm.   D. Perihepatic tumor, excision:  Residual high grade serous carcinoma.   E. Gallbladder, cholecystectomy:  Chronic cholecystitis and cholelithiasis. Negative for malignancy.   F. Diaphragm, excision:  Residual high grade serous carcinoma, > 2 cm  G. Omentum #2, excision:   Residual high grade serous carcinoma.     She developed dyspnea and was admitted twice to [facility]. She was admitted to [facility] on 07/01/2015 and 07/07/2015 for recurrent shortness of breath.  She has undergone 2 additional thoracentesis (1.1 L on 07/02/2015 and 850 cc on 07/08/2015).  Cytology was negative x 2.  Bilateral lower extremity duplex on 07/03/2015 was negative.  Echo on 07/08/2015 revealed en EF of 55-60%.    She was admitted to [facility] from 07/29/2015 - 08/10/2015 with a right-sided empyema and perihepatic abscess. She underwent CT-guided placement of a liver abscess drain on 07/30/2015. Liver abscess culture grew out group B strep and Enterobacter which was sensitive to Zosyn. She was transitioned to ertapenem Colbert Ewing) prior to discharge.  She was readmitted to [facility] on 09/17/2015.  Chest, abdomen, and pelvic CT scan on 09/01/2015 revealed continued decrease in perihepatic fluid collection contiguous with the right pleural space with percutaneous drain. Drain was removed  on 09/01/2015.   Chest, abdomen, and pelvic CT scan on 09/11/2015 revealed a residual versus recurrent fluid collection posterior to the right hepatic lobe (2.6 x 1.1 x 4.0 cm).  Chest, abdomen, and pelvic CT scan on 10/13/2015 revealed resolution of the empyema.    Chemotherapy was held due to a combination of multiple comorbidities and RA associated neutropenia.  We recommended proceeding with chemotherapy if she had a symptomatic relapse or if her CT scan shows considerable disease.  02/15/2016:CT scan revealed multiple soft tissue nodules throughout the pelvis, small bowel mesenteric and peritoneum and, concerning for peritoneal metastatic disease.  There was soft tissue irregularity within the left upper quadrant which may represent postprocedural changes or metastatic disease; mild right hydronephrosis (etiology unclear); a pleural based nodule within the right lower hemi-thorax concerning for the possibility of pleural based metastasis; small bilateral pleural effusions; pulmonary nodularity, predominately within the left upper lobe (metastatic or infectious/inflammatory etiology); slightly increased mediastinal adenopathy (infectious/inflammatory or metastatic); interval development of a low attenuation lesion within the left hepatic lobe (complicated fluid within the fissure or metastatic disease).  She was restarted on chemotherapy with carboplatin and gemcitabine.  She just saw Dr. Mike Gip prior to day 8 of cycle #3 carboplatin and gemcitabine.  05/26/2016 Imaging included MR brain that revealed multiple small areas of acute infarct involving the occipital parietal lobe bilaterally and the left lateral cerebellum, consistent with posterior circulation emboli.  Atrophy and mild chronic microvascular ischemia. US carotid: Less than 50% stenosis in the right internal carotid artery.  50-69% stenosis in the left internal carotid artery. CT angiography: Right proximal ICA mild 40% stenosis due to calcified plaque,  Left proximal ICA moderate 60% stenosis due to calcified plaque, Bilateral vertebral artery V3/V4 junction calcified plaque with moderate stenosis, Bilateral ICA cavernous and paraclinoid severe calcific atherosclerosis with mild-to-moderate paraclinoid stenosis, Otherwise no significant stenosis, proximal occlusion, aneurysm, or vascular malformation of circle of Willis; Mild aortic atherosclerosis; 15 mm left thyroid lobe nodule, this can be further characterized with thyroid ultrasound on a nonemergent basis; Left upper lobe pulmonary nodules measuring up to 4 mm.   06/10/2016 CT abdomen and pelvis: Progression of peritoneal carcinomatosis; worsening bilateral obstructive uropathy due to malignant involvement of the pelvic ureters; stable small pleural/subpleural nodules right and small left pleural effusion; additional findings include aortic atherosclerosis, coronary atherosclerosis, stable nonspecific ampullary mass and small hiatal hernia.  Her chemotherapy was altered to Doxil and she was responding based on CA125 values. 60.2 to 44.   Genetic testing: MyRisk testing negative for APC, ATM, BMPR1A, BRCA1, BRCA2, BRIP1, CDH1, CDK4, CDKN2A, CHEK2, EPCAM, MLH1, MSH2, MSH6, MUTYH, NBN, PALB2, PMS2, PTEN, RAD51C, RAD51D, SMAD4, STK11, TP53. Sequencing for select regions of POLE and POLD1, and large rearrangement analysis for select regions of GREM1 were negative.   Of note she has severe rheumatoid arthritis with persistent neutropenia; diabetes and is on an insulin pump; near occlusive thrombus within the central portion of the right internal jugular vein and central portion of the right subclavian vein on Lovenox; and persistent grade III neuropathy.     Problem List: Patient Active Problem List   Diagnosis Date Noted  . Pressure injury of skin 05/27/2016  . Altered mental status 05/26/2016  . Acute embolic stroke ([place]) 24/40/1027  . Hx of CABG 04/12/2016  . Encounter for antineoplastic  chemotherapy 03/31/2016  . Heel ulcer ([place]) 12/09/2015  . Bile duct abnormality 12/09/2015  . Diabetic ulcer of left heel associated with type 1 diabetes mellitus (  [place]) 12/07/2015  . Alkaline phosphatase elevation 11/17/2015  . Leukopenia 11/06/2015  . Weight loss 09/17/2015  . Liver abscess 09/03/2015  . Thrombosis of right internal jugular vein ([place]) 09/03/2015  . Fatigue 07/17/2015  . Recurrent pleural effusion on right 07/01/2015  . Decubitus ulcer 05/19/2015  . Hyponatremia 05/15/2015  . Neuropathy 05/15/2015  . Malignant neoplasm of ovary ([place]) 05/06/2015  . Bilateral lower extremity edema 03/30/2015  . Hypomagnesemia 03/05/2015  . Diarrhea 02/26/2015  . Peritoneal carcinomatosis ([place]) 02/17/2015  . Anemia 02/17/2015  . Adnexal mass 02/13/2015  . History of esophageal stricture 07/08/2014  . Dysphagia, pharyngoesophageal phase 07/08/2014  . Candida esophagitis ([place]) 05/15/2014  . Cataracts, bilateral 01/13/2014  . Medicare annual wellness visit, subsequent 09/20/2013  . CAD (coronary artery disease) of artery bypass graft 11/05/2012  . Anemia, iron deficiency 10/09/2012  . CAD (coronary artery disease) 10/01/2012  . Hearing loss 07/06/2011  . Diabetes type 1, controlled ([place]) 07/06/2011  . Gastroesophageal reflux disease with stricture 07/06/2011  . GERD (gastroesophageal reflux disease) 04/05/2011  . Osteoporosis, unspecified 04/05/2011  . Hypertension 01/10/2011  . Hyperlipidemia 01/10/2011  . RA (rheumatoid arthritis) ([place]) 01/10/2011   Past Medical History: Past Medical History:  Diagnosis Date  . CAD (coronary artery disease)    a. 09/2012 Cath: LM nl, LAD 95p, 52m LCX 928mOM2 50, RCA 100.  . Marland Kitchenholelithiasis   . Chronic diastolic CHF (congestive heart failure) ([place]  . Collagen vascular disease ([place]  . Esophageal stricture   . Exertional shortness of breath   . GERD (gastroesophageal reflux disease)   . H/O hiatal hernia   . Herniated disc   . History  of pancytopenia   . Hyperlipidemia   . Hypertension   . Hypokalemia   . Leukopenia 2012   s/p bone marrow biopsy, Dr. PaMa Hillock. NSTEMI (non-ST elevated myocardial infarction) ([place]/2014   "mild" (10/18/2012)  . Ovarian cancer ([facility] [place]   chemo  . Pneumonia 2013; 08/2012   "one lung; double" (10/18/2012)  . Rheumatoid arthritis(714.0)   . Type I diabetes mellitus ([place]   "dx'd in 1957" (10/18/2012)    Past Surgical History: Past Surgical History:  Procedure Laterality Date  . ABDOMINAL HYSTERECTOMY  06/15/2015   Dx L/S, EXLAP TAH BSO omentectomy RSRx colostomy diaphragm resection stripping  . CARDIAC CATHETERIZATION  10/18/2012   "first one was today" (10/18/2012)  . CATARACT EXTRACTION W/ INTRAOCULAR LENS IMPLANT Right 2010  . CHOLECYSTECTOMY  06/15/2015   combined case with ovarian cancer debulking  . COLON SURGERY    . CORONARY ARTERY BYPASS GRAFT N/A 10/19/2012   Procedure: CORONARY ARTERY BYPASS GRAFTING (CABG);  Surgeon: StMelrose NakayamaMD;  [facility] Service: Open Heart Surgery;  Laterality: N/A;  . ESOPHAGEAL DILATION     "3 or 4 times" (10/19/2011)  . ESOPHAGOGASTRODUODENOSCOPY  2012   Dr. IfMinna Merritts. OSTOMY    . OVARY SURGERY     removal  . PERIPHERAL VASCULAR CATHETERIZATION N/A 03/02/2015   Procedure: PoGlori Luisath Insertion;  Surgeon: JaAlgernon HuxleyMD;  [facility];  Service: Cardiovascular;  Laterality: N/A;  . TUBAL LIGATION  1970    Past Gynecologic History:  Postmenopausal She has not had a GYN evaluation since age 71   OB History:  G3P3  1. SVD - neonatal death 2. SVD - singleton 3. SVD - twins  Family History: Family History  Problem Relation Age of Onset  .  Diabetes Mother   . Arthritis Mother   . Diabetes Father   . Arthritis Father   . Bone cancer Sister     Social History: Social History   Social History  . Marital status: Married    Spouse name: N/A  . Number of children: 3  . Years of education: N/A    Occupational History  . Retired Retired   Social History Main Topics  . Smoking status: Former Smoker    Packs/day: 1.00    Years: 30.00    Types: Cigarettes    Quit date: 06/11/1994  . Smokeless tobacco: Never Used  . Alcohol use No  . Drug use: No  . Sexual activity: No   Other Topics Concern  . Not on file   Social History Narrative  . No narrative on file    Allergies: Allergies  Allergen Reactions  . Codeine Nausea And Vomiting  . Latex Rash    Current Medications: Current Outpatient Prescriptions  Medication Sig Dispense Refill  . Calcium-Vitamin D 600-200 MG-UNIT tablet Take 1 tablet by mouth 2 (two) times daily.     . cholecalciferol (VITAMIN D) 1000 units tablet Take 1,000 Units by mouth daily.    Marland Kitchen enoxaparin (LOVENOX) 60 MG/0.6ML injection INJECT 0.6 ML (60 MG TOTAL) INTO THE SKIN EVERY 12 HOURS 36 mL 2  . folic acid (FOLVITE) 1 MG tablet TAKE 1 TABLET BY MOUTH ONCE A DAY 90 tablet 3  . furosemide (LASIX) 80 MG tablet Take 0.5 tablets by mouth 2 (two) times daily.    . insulin lispro (HUMALOG) 100 UNIT/ML injection Inject 0.06 mLs (6 Units total) into the skin daily. (Patient taking differently: Inject 0-85 Units into the skin daily. Via insulin pump) 30 mL 3  . lidocaine-prilocaine (EMLA) cream APPLY 1 APPLICATION TOPICALLY AS NEEDED 1 HOUR PRIOR TO TREATMENT. COVER WITH PRESS N SEAL UNTIL TREATMENT TIME AS DIRECTED 30 g 1  . loratadine (CLARITIN) 10 MG tablet Take 1 tablet by mouth daily as needed.    Marland Kitchen losartan (COZAAR) 25 MG tablet Take 1 tablet (25 mg total) by mouth daily. 90 tablet 2  . magnesium oxide (MAG-OX) 400 (241.3 Mg) MG tablet TAKE 1 TABLET BY MOUTH ONCE DAILY 30 tablet 0  . metoprolol tartrate (LOPRESSOR) 25 MG tablet Take 1 tablet (25 mg total) by mouth 2 (two) times daily. 180 tablet 3  . ondansetron (ZOFRAN) 8 MG tablet TAKE 1 TABLET BY MOUTH EVERY 8 HOURS AS NEEDED FOR NAUSEA OR VOMITING 30 tablet 1  . ONE TOUCH ULTRA TEST test strip      . pantoprazole (PROTONIX) 40 MG tablet Take 1 tablet (40 mg total) by mouth 2 (two) times daily. 180 tablet 1  . potassium chloride SA (K-DUR,KLOR-CON) 20 MEQ tablet Take 1 tablet (20 mEq total) by mouth daily. 30 tablet 0  . predniSONE (DELTASONE) 5 MG tablet Take 5 mg by mouth daily with breakfast.    . ranitidine (ZANTAC) 150 MG tablet Take 1 tablet (150 mg total) by mouth 2 (two) times daily. 180 tablet 1  . vitamin C (ASCORBIC ACID) 500 MG tablet Take 500 mg by mouth daily.    Marland Kitchen zinc gluconate 50 MG tablet Take 50 mg by mouth daily.     Current Facility-Administered Medications  Medication Dose Route Frequency Provider Last Rate Last Dose  . Tbo-Filgrastim (GRANIX) injection 300 mcg  300 mcg Subcutaneous Once Lequita Asal, MD       Facility-Administered Medications Ordered in  Other Visits  Medication Dose Route Frequency Provider Last Rate Last Dose  . sodium chloride 0.9 % injection 10 mL  10 mL Intracatheter PRN Lequita Asal, MD      . sodium chloride 0.9 % injection 10 mL  10 mL Intravenous PRN Lequita Asal, MD   10 mL at 04/13/15 5035    Review of Systems General: fatigue and weakness Skin: negative HEENT: negative Pulmonary: dyspnea chronic Cardiac: negative for pain Gastrointestinal:  N/v resolved and ostomy is functioning well.  Genitourinary/Sexual: negative for, dysuria Ob/Gyn: negative for, irregular bleeding, pain Musculoskeletal: negative  Hematology: negative for, easy bruising, bleeding Neuro: peripheral neuropathy symptoms, chronic secondary from chemotherapy and DM  Objective:  Physical Examination:  BP 115/67 (BP Location: Left Arm, Patient Position: Sitting)   Pulse 98   Temp 97 F (36.1 C) (Tympanic)   Ht _0  (1.6 m)   Wt 140 lb 6.4 oz (63.7 kg)   BMI 24.87 kg/m   Body mass index is 24.87 kg/m.  ECOG Performance Status: 2 - Symptomatic, <50% confined to bed  General appearance: alert, cooperative and appears stated age,  pale HEENT: ATNC CV: RRR Lungs: BCTA Abdomen: soft, nondistended and nontender, no mass/ascites/hernias. Ostomy bag intact, stoma partially seen and appears pink.  Incision well healed. Bowel sounds active and there is tinkling and one rush in the RLQ Extremities: extremities atraumatic, resolved edema Neurological exam reveals alert, oriented, normal speech, no focal findings or movement disorder noted.  Pelvic: EGBUS/Vagina: normal. Vaginal cuff intact. No gross lesions. Uterus/cervic surgically absent.  Bimanual: no nodularity on the right pelvis, there was a prior nodule but that is not palpable The left side is also negative. RV confirms.    Lab Review Lab Results  Component Value Date   CA125 44.0 (H) 07/19/2016     Lab Results  Component Value Date   WBC 4.7 08/09/2016   HGB 11.6 (L) 08/09/2016   HCT 34.2 (L) 08/09/2016   MCV 92.6 08/09/2016   PLT 311 08/09/2016     Chemistry      Component Value Date/Time   NA 135 08/09/2016 1222   NA 134 (L) 09/27/2012 0459   K 3.7 08/09/2016 1222   K 4.5 09/27/2012 0459   CL 97 (L) 08/09/2016 1222   CL 100 09/27/2012 0459   CO2 27 08/09/2016 1222   CO2 28 09/27/2012 0459   BUN 17 08/09/2016 1222   BUN 8 09/27/2012 0459   CREATININE 0.83 08/09/2016 1222   CREATININE 0.60 09/27/2012 0459      Component Value Date/Time   CALCIUM 9.5 08/09/2016 1222   CALCIUM 8.5 09/27/2012 0459   ALKPHOS 206 (H) 08/09/2016 1222   ALKPHOS 100 09/21/2012 1210   AST 32 08/09/2016 1222   AST 36 09/21/2012 1210   ALT 49 08/09/2016 1222   ALT 23 09/21/2012 1210   BILITOT 0.7 08/09/2016 1222   BILITOT 0.3 04/08/2016 0751   BILITOT 0.7 09/21/2012 1210         Assessment:  [name] is a 71 y.o. 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    S/p diagnostic laparoscopy, exploratory laparotomy, lysis of adhesions (including enterolysis  for > 45 minutes), total abdominal hysterectomy with bilateral salpingo-oophorectomy, infracolic omentectomy, optimal tumor debulking, recto-sigmoid resection with creation of end colostomy, mobilization of the splenic flexure  mobilization of the liver with diaphragmatic  stripping and resection of diaphragm due to disease, repair of diaphragmatic defect and cholecystectomy on 06/15/2015.   Symptomatic right pleural effusion s/p thoracenteses complicated by right-sided empyema and perihepatic abscess.  Platinum-sensitive recurrence with evidence of response based on CA125. Pelvic exam noted for isolated nodule. Treated with carboplatin and gemcitabine chemotherapy. Progressive disease and platinum-resistance. Single-agent PLD, progressive disease.   Partial SBO resolved with conservative management.    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   Plan:   Problem List Items Addressed This Visit      Genitourinary   Malignant neoplasm of ovary ([place]) - Primary     We will obtain CA125 today. We discussed treatment options. I do not recommend bevacizumab as she is at a 11% risk of GI perforation given bowel involvement and multiple prior chemotherapy regimens. She is not eligible for PARPi. Options include topotecan, oral or IV etoposide, oral cyclophosphamide, docetaxel or abraxane (less likely given her issues with peripheral neuropathy), pemetrexed (less likely given toxicity with this agent).   We discussed that at some point the bowel obstructive symptoms may worsen and at that point she may be eligible for G-tube or surgery. I would recommend G-tube given all her comorbidities. Another option would be for IR to drain the fluid mass adjacent to the transition point. This may not be possible due to the location.    She will  follow up with Dr Mike Gip for continued chemotherapy.   We can see her back in 3 months or as needed for repeat pelvic exam.   Gillis Ends, MD  CC:   Referring Provider: Lequita Asal, MD  Additional Care Provider: Dr. Harvest Dark. Dr. Percell Boston

## 2016-08-18 ENCOUNTER — Inpatient Hospital Stay: Payer: Medicare Other

## 2016-08-18 DIAGNOSIS — C569 Malignant neoplasm of unspecified ovary: Secondary | ICD-10-CM | POA: Diagnosis not present

## 2016-08-18 LAB — CA 125: CA 125: 65.8 U/mL — ABNORMAL HIGH (ref 0.0–38.1)

## 2016-08-18 MED ORDER — SODIUM CHLORIDE 0.9 % IV SOLN
INTRAVENOUS | Status: DC
  Administered 2016-08-18: 14:00:00 via INTRAVENOUS
  Filled 2016-08-18: qty 1000

## 2016-08-18 MED ORDER — HEPARIN SOD (PORK) LOCK FLUSH 100 UNIT/ML IV SOLN
500.0000 [IU] | Freq: Once | INTRAVENOUS | Status: AC
  Administered 2016-08-18: 500 [IU] via INTRAVENOUS
  Filled 2016-08-18: qty 5

## 2016-08-18 MED ORDER — MAGNESIUM SULFATE 4 GM/100ML IV SOLN
4.0000 g | Freq: Once | INTRAVENOUS | Status: AC
  Administered 2016-08-18: 4 g via INTRAVENOUS

## 2016-08-18 MED ORDER — SODIUM CHLORIDE 0.9% FLUSH
10.0000 mL | Freq: Once | INTRAVENOUS | Status: AC
  Administered 2016-08-18: 10 mL via INTRAVENOUS
  Filled 2016-08-18: qty 10

## 2016-08-19 ENCOUNTER — Other Ambulatory Visit: Payer: Self-pay | Admitting: Hematology and Oncology

## 2016-08-19 ENCOUNTER — Inpatient Hospital Stay: Payer: Medicare Other

## 2016-08-19 ENCOUNTER — Other Ambulatory Visit: Payer: Self-pay | Admitting: *Deleted

## 2016-08-19 ENCOUNTER — Inpatient Hospital Stay (HOSPITAL_BASED_OUTPATIENT_CLINIC_OR_DEPARTMENT_OTHER): Payer: Medicare Other | Admitting: Hematology and Oncology

## 2016-08-19 VITALS — BP 83/55 | HR 90 | Temp 97.7°F | Wt 136.6 lb

## 2016-08-19 VITALS — BP 95/59 | HR 83 | Resp 18

## 2016-08-19 DIAGNOSIS — I10 Essential (primary) hypertension: Secondary | ICD-10-CM

## 2016-08-19 DIAGNOSIS — K219 Gastro-esophageal reflux disease without esophagitis: Secondary | ICD-10-CM

## 2016-08-19 DIAGNOSIS — Z8701 Personal history of pneumonia (recurrent): Secondary | ICD-10-CM

## 2016-08-19 DIAGNOSIS — I5032 Chronic diastolic (congestive) heart failure: Secondary | ICD-10-CM

## 2016-08-19 DIAGNOSIS — Z7952 Long term (current) use of systemic steroids: Secondary | ICD-10-CM

## 2016-08-19 DIAGNOSIS — Z66 Do not resuscitate: Secondary | ICD-10-CM

## 2016-08-19 DIAGNOSIS — I2510 Atherosclerotic heart disease of native coronary artery without angina pectoris: Secondary | ICD-10-CM | POA: Diagnosis not present

## 2016-08-19 DIAGNOSIS — E108 Type 1 diabetes mellitus with unspecified complications: Secondary | ICD-10-CM

## 2016-08-19 DIAGNOSIS — C569 Malignant neoplasm of unspecified ovary: Secondary | ICD-10-CM

## 2016-08-19 DIAGNOSIS — K222 Esophageal obstruction: Secondary | ICD-10-CM

## 2016-08-19 DIAGNOSIS — C786 Secondary malignant neoplasm of retroperitoneum and peritoneum: Secondary | ICD-10-CM

## 2016-08-19 DIAGNOSIS — Z90710 Acquired absence of both cervix and uterus: Secondary | ICD-10-CM

## 2016-08-19 DIAGNOSIS — I998 Other disorder of circulatory system: Secondary | ICD-10-CM | POA: Diagnosis not present

## 2016-08-19 DIAGNOSIS — Z90722 Acquired absence of ovaries, bilateral: Secondary | ICD-10-CM

## 2016-08-19 DIAGNOSIS — Z7189 Other specified counseling: Secondary | ICD-10-CM | POA: Insufficient documentation

## 2016-08-19 DIAGNOSIS — I252 Old myocardial infarction: Secondary | ICD-10-CM

## 2016-08-19 DIAGNOSIS — I6340 Cerebral infarction due to embolism of unspecified cerebral artery: Secondary | ICD-10-CM

## 2016-08-19 DIAGNOSIS — E785 Hyperlipidemia, unspecified: Secondary | ICD-10-CM

## 2016-08-19 DIAGNOSIS — Z87891 Personal history of nicotine dependence: Secondary | ICD-10-CM

## 2016-08-19 DIAGNOSIS — Z7901 Long term (current) use of anticoagulants: Secondary | ICD-10-CM

## 2016-08-19 DIAGNOSIS — R1900 Intra-abdominal and pelvic swelling, mass and lump, unspecified site: Secondary | ICD-10-CM

## 2016-08-19 DIAGNOSIS — D61818 Other pancytopenia: Secondary | ICD-10-CM

## 2016-08-19 DIAGNOSIS — Z79899 Other long term (current) drug therapy: Secondary | ICD-10-CM | POA: Diagnosis not present

## 2016-08-19 DIAGNOSIS — C7800 Secondary malignant neoplasm of unspecified lung: Secondary | ICD-10-CM | POA: Diagnosis not present

## 2016-08-19 DIAGNOSIS — M069 Rheumatoid arthritis, unspecified: Secondary | ICD-10-CM

## 2016-08-19 DIAGNOSIS — E876 Hypokalemia: Secondary | ICD-10-CM

## 2016-08-19 DIAGNOSIS — C801 Malignant (primary) neoplasm, unspecified: Secondary | ICD-10-CM

## 2016-08-19 DIAGNOSIS — K449 Diaphragmatic hernia without obstruction or gangrene: Secondary | ICD-10-CM

## 2016-08-19 MED ORDER — HEPARIN SOD (PORK) LOCK FLUSH 100 UNIT/ML IV SOLN
500.0000 [IU] | Freq: Once | INTRAVENOUS | Status: AC
  Administered 2016-08-19: 500 [IU] via INTRAVENOUS
  Filled 2016-08-19: qty 5

## 2016-08-19 MED ORDER — SODIUM CHLORIDE 0.9 % IV SOLN
Freq: Once | INTRAVENOUS | Status: AC
  Administered 2016-08-19: 12:00:00 via INTRAVENOUS
  Filled 2016-08-19: qty 1000

## 2016-08-19 MED ORDER — SODIUM CHLORIDE 0.9% FLUSH
10.0000 mL | Freq: Once | INTRAVENOUS | Status: AC
  Administered 2016-08-19: 10 mL via INTRAVENOUS
  Filled 2016-08-19: qty 10

## 2016-08-19 NOTE — Patient Instructions (Signed)
Topotecan injection What is this medicine? TOPOTECAN (TOE poe TEE kan) is a chemotherapy drug. It is used to treat lung cancer, ovarian cancer, and cervical cancer. This medicine may be used for other purposes; ask your health care provider or pharmacist if you have questions. COMMON BRAND NAME(S): Hycamtin What should I tell my health care provider before I take this medicine? They need to know if you have any of these conditions: -blood disorders -dehydration -diarrhea -immune system problems -infection (especially a virus infection such as chickenpox, cold sores, or herpes) -kidney disease -low blood counts, like low white cell, platelet, or red cell counts -recent or ongoing radiation therapy -an unusual or allergic reaction to topotecan, other medicines, foods, dyes, or preservatives -pregnant or trying to get pregnant -breast-feeding How should I use this medicine? This medicine is for infusion into a vein. It is usually given by a health care professional in a hospital or clinic setting. In rare cases, you might get this medicine at home. You will be taught how to give this medicine. Use exactly as directed. Take your medicine at regular intervals. Do not take your medicine more often than directed. It is important that you put your used needles and syringes in a special sharps container. Do not put them in a trash can. If you do not have a sharps container, call your pharmacist or healthcare provider to get one. Talk to your pediatrician regarding the use of this medicine in children. Special care may be needed. Overdosage: If you think you have taken too much of this medicine contact a poison control center or emergency room at once. NOTE: This medicine is only for you. Do not share this medicine with others. What if I miss a dose? It is important not to miss your dose. Call your doctor or health care professional if you are unable to keep an appointment. What may interact with  this medicine? -amiodarone -azithromycin -captopril -carvedilol -certain medications for fungal infections like ketoconazole and itraconazole -clarithromycin -conivaptan -cyclosporine -diltiazem -dronedarone -eltrombopag -erythromycin -felodipine -grapefruit juice -lopinavir -quercetin -quinidine -ranolazine -ritonavir -ticagrelor -verapamil This list may not describe all possible interactions. Give your health care provider a list of all the medicines, herbs, non-prescription drugs, or dietary supplements you use. Also tell them if you smoke, drink alcohol, or use illegal drugs. Some items may interact with your medicine. What should I watch for while using this medicine? This drug may make you feel generally unwell. This is not uncommon, as chemotherapy can affect healthy cells as well as cancer cells. Report any side effects. Continue your course of treatment even though you feel ill unless your doctor tells you to stop. Call your doctor or health care professional for advice if you get a fever, chills or sore throat, or other symptoms of a cold or flu. Do not treat yourself. This drug decreases your body's ability to fight infections. Try to avoid being around people who are sick. This medicine may increase your risk to bruise or bleed. Call your doctor or health care professional if you notice any unusual bleeding. Be careful brushing and flossing your teeth or using a toothpick because you may get an infection or bleed more easily. If you have any dental work done, tell your dentist you are receiving this medicine. Avoid taking products that contain aspirin, acetaminophen, ibuprofen, naproxen, or ketoprofen unless instructed by your doctor. These medicines may hide a fever. Do not become pregnant while taking this medicine or within 1 month of   stopping it. Women should inform their doctor if they wish to become pregnant or think they might be pregnant. There is a potential for  serious side effects to an unborn child. Talk to your health care professional or pharmacist for more information. Do not breast-feed an infant while taking this medicine. Men must use a latex condom during sexual contact with a woman while taking this medicine and for 3 months after you stop taking this medicine. A latex condom is needed even if you have had a vasectomy. Contact your doctor right away if your partner becomes pregnant. Do not donate sperm while taking this medicine and for 3 months after you stop taking this medicine. What side effects may I notice from receiving this medicine? Side effects that you should report to your doctor or health care professional as soon as possible: -allergic reactions like skin rash, itching or hives, swelling of the face, lips, or tongue -breathing difficulties -diarrhea -dizziness -fever or chills, sore throat -mouth sores or pain -pain, tingling, numbness in the hands or feet -unusual bleeding or bruising -unusually weak or tired -yellowing of the eyes or skin Side effects that usually do not require medical attention (report to your doctor or health care professional if they continue or are bothersome): -hair loss -headache -loss of appetite -nausea, vomiting -stomach pain This list may not describe all possible side effects. Call your doctor for medical advice about side effects. You may report side effects to FDA at 1-800-FDA-1088. Where should I keep my medicine? Keep out of the reach of children. This drug is usually given in a hospital or clinic and will not be stored at home. In rare cases, this medicine may be given at home. If you are using this medicine at home, you will be instructed on how to store this medicine. Throw away any unused medicine after the expiration date on the label. NOTE: This sheet is a summary. It may not cover all possible information. If you have questions about this medicine, talk to your doctor, pharmacist, or  health care provider.  2018 [employer] (2013-10-16 11:14:18)  

## 2016-08-19 NOTE — Progress Notes (Signed)
DISCONTINUE ON PATHWAY REGIMEN - Ovarian     A cycle is every 28 days:     Liposomal doxorubicin        Dose Mod: None     Bevacizumab        Dose Mod: None  **Always confirm dose/schedule in your pharmacy ordering system**    REASON: Disease Progression PRIOR TREATMENT: [id number]: Liposomal Doxorubicin (Doxil) 40 mg/m2 D1 + Bevacizumab 10 mg/kg D1, 15 q28 Days; Re-evaluate Every 3 Cycles, Treat until Complete Response, Unacceptable Toxicity, or Disease Progression TREATMENT RESPONSE: Progressive Disease (PD)  START ON PATHWAY REGIMEN - Ovarian     A cycle is every 21 days:     Topotecan   **Always confirm dose/schedule in your pharmacy ordering system**    Patient Characteristics: [place], BRCA Mutation Absent/Unknown AJCC T Category: Staged < 8th Ed. AJCC N Category: Staged < 8th Ed. AJCC M Category: Staged < 8th Ed. AJCC 8 Stage Grouping: Staged < 8th Ed. Line of Therapy: [facility] [place] [employer] BRCA Mutation Status: Absent Would you be surprised if this patient died  in the next year? I would NOT be surprised if this patient died in the next year  Intent of Therapy: Non-Curative / Palliative Intent, Discussed with Patient

## 2016-08-19 NOTE — Progress Notes (Signed)
[facility] day:  08/19/2016  Chief Complaint: [name] is a 71 y.o. female with stage IV ovarian cancer who is seen for assessment after interval hospitalization.  HPI:  The patient was last seen in the medical oncology clinic on 07/19/2016.  At that time, she was doing well.  She received cycle #2 Doxil with Neulasta support.  She tolerated her chemotherapy well.  CA125 decreased to 44.0.  She presented to the [facility] ER on 08/09/2016 with a small bowel obstruction.  Abdomen and pelvic CT scan on 08/09/2016 revealed interval increase and loculated fluid collections within the peritoneal space consistent with progression of peritoneal ovarian carcinoma metastasis.  There was one large 7 cm collection in the RIGHT lower quadrant which had increased significantly in size and may represent of point of small bowel obstruction.  The proximal stomach and duodenum were decompressed. The mid small bowel was mildly dilated. There was concern for obstruction given the large intraperitoneal fluid collection.  There was increase in subcapsular hepatic metastatic fluid collections.  There was enlargement of rounded ampullary lesion in the second portion duodenum concerning for metastatic lesion.  There was mild biliary duct dilatation (stable).  There was nodular pleural metastasis in the RIGHT lower lobe pleural space.  Lesions had increased.  There were multiple subcutaneous lesions in the LEFT and RIGHT lower abdomen new from prior and concerning for metastatic lesions (likely due to SQ Lovenox).  She was admitted to [facility] from 08/10/2016 - 08/14/2016.  She was managed conservatively.  She met with Dr. Theora Gianotti on 08/17/2016.   Options regarding her progressive ovarian cancer were discussed including topotecan, etoposide, cyclophosphamide, docetaxel, and  Alimta.  Response rates were estimated at 20%.  She was not felt to be a candidate for Avastin given the risk of  GI perforation.  She was ineligible for a PARP inhibitor.    She received 4 gm of magnesium on 08/17/2016.  Magnesium was 1.3.  Symptomatically, she feels dizzy today.  She typically takes her blood pressure before taking her blood pressure medication sin the morning.  Blood pressure is typically 177-939 systolic.  She took a Lasix pill last night and her Losartan this morning.  She denies any fevers or sweats.  She denies any abdominal pain, nausea or vomiting.   Past Medical History:  Diagnosis Date  . CAD (coronary artery disease)    a. 09/2012 Cath: LM nl, LAD 95p, 73m LCX 937mOM2 50, RCA 100.  . Marland Kitchenholelithiasis   . Chronic diastolic CHF (congestive heart failure) ([place]  . Collagen vascular disease ([place]  . Esophageal stricture   . Exertional shortness of breath   . GERD (gastroesophageal reflux disease)   . H/O hiatal hernia   . Herniated disc   . History of pancytopenia   . Hyperlipidemia   . Hypertension   . Hypokalemia   . Leukopenia 2012   s/p bone marrow biopsy, Dr. PaMa Hillock. NSTEMI (non-ST elevated myocardial infarction) (HCGisela5/2014   "mild" (10/18/2012)  . Ovarian cancer (HCLake Arthur2016   chemo  . Pneumonia 2013; 08/2012   "one lung; double" (10/18/2012)  . Rheumatoid arthritis(714.0)   . Type I diabetes mellitus ([place]   "dx'd in 1957" (10/18/2012)    Past Surgical History:  Procedure Laterality Date  . ABDOMINAL HYSTERECTOMY  06/15/2015   Dx L/S, EXLAP TAH BSO omentectomy RSRx colostomy diaphragm resection stripping  . CARDIAC CATHETERIZATION  10/18/2012   "  first one was today" (10/18/2012)  . CATARACT EXTRACTION W/ INTRAOCULAR LENS IMPLANT Right 2010  . CHOLECYSTECTOMY  06/15/2015   combined case with ovarian cancer debulking  . COLON SURGERY    . CORONARY ARTERY BYPASS GRAFT N/A 10/19/2012   Procedure: CORONARY ARTERY BYPASS GRAFTING (CABG);  Surgeon: Melrose Nakayama, MD;  Location: [place];  Service: Open Heart Surgery;  Laterality: N/A;  . ESOPHAGEAL DILATION      "3 or 4 times" (10/19/2011)  . ESOPHAGOGASTRODUODENOSCOPY  2012   Dr. Minna Merritts  . OSTOMY    . OVARY SURGERY     removal  . PERIPHERAL VASCULAR CATHETERIZATION N/A 03/02/2015   Procedure: Glori Luis Cath Insertion;  Surgeon: Algernon Huxley, MD;  Location: [facility];  Service: Cardiovascular;  Laterality: N/A;  . TUBAL LIGATION  1970    Family History  Problem Relation Age of Onset  . Diabetes Mother   . Arthritis Mother   . Diabetes Father   . Arthritis Father   . Bone cancer Sister     Social History:  reports that she quit smoking about 22 years ago. Her smoking use included Cigarettes. She has a 30.00 pack-year smoking history. She has never used smokeless tobacco. She reports that she does not drink alcohol or use drugs.  She is accompanied by her husband today.  Allergies:  Allergies  Allergen Reactions  . Codeine Nausea And Vomiting  . Latex Rash    Current Medications: Current Outpatient Prescriptions  Medication Sig Dispense Refill  . Calcium-Vitamin D 600-200 MG-UNIT tablet Take 1 tablet by mouth 2 (two) times daily.     . cholecalciferol (VITAMIN D) 1000 units tablet Take 1,000 Units by mouth daily.    Marland Kitchen enoxaparin (LOVENOX) 60 MG/0.6ML injection INJECT 0.6 ML (60 MG TOTAL) INTO THE SKIN EVERY 12 HOURS 36 mL 2  . folic acid (FOLVITE) 1 MG tablet TAKE 1 TABLET BY MOUTH ONCE A DAY 90 tablet 3  . furosemide (LASIX) 80 MG tablet Take 0.5 tablets by mouth 2 (two) times daily.    . insulin lispro (HUMALOG) 100 UNIT/ML injection Inject 0.06 mLs (6 Units total) into the skin daily. (Patient taking differently: Inject 0-85 Units into the skin daily. Via insulin pump) 30 mL 3  . lidocaine-prilocaine (EMLA) cream APPLY 1 APPLICATION TOPICALLY AS NEEDED 1 HOUR PRIOR TO TREATMENT. COVER WITH PRESS N SEAL UNTIL TREATMENT TIME AS DIRECTED 30 g 1  . loratadine (CLARITIN) 10 MG tablet Take 1 tablet by mouth daily as needed.    Marland Kitchen losartan (COZAAR) 25 MG tablet Take 1 tablet (25  mg total) by mouth daily. 90 tablet 2  . magnesium oxide (MAG-OX) 400 (241.3 Mg) MG tablet TAKE 1 TABLET BY MOUTH ONCE DAILY 30 tablet 0  . metoprolol tartrate (LOPRESSOR) 25 MG tablet Take 1 tablet (25 mg total) by mouth 2 (two) times daily. 180 tablet 3  . ondansetron (ZOFRAN) 8 MG tablet TAKE 1 TABLET BY MOUTH EVERY 8 HOURS AS NEEDED FOR NAUSEA OR VOMITING 30 tablet 1  . ONE TOUCH ULTRA TEST test strip     . pantoprazole (PROTONIX) 40 MG tablet Take 1 tablet (40 mg total) by mouth 2 (two) times daily. 180 tablet 1  . potassium chloride SA (K-DUR,KLOR-CON) 20 MEQ tablet Take 1 tablet (20 mEq total) by mouth daily. 30 tablet 0  . predniSONE (DELTASONE) 5 MG tablet Take 5 mg by mouth daily with breakfast.    . ranitidine (ZANTAC) 150 MG tablet  Take 1 tablet (150 mg total) by mouth 2 (two) times daily. 180 tablet 1  . vitamin C (ASCORBIC ACID) 500 MG tablet Take 500 mg by mouth daily.    Marland Kitchen zinc gluconate 50 MG tablet Take 50 mg by mouth daily.     Current Facility-Administered Medications  Medication Dose Route Frequency Provider Last Rate Last Dose  . Tbo-Filgrastim (GRANIX) injection 300 mcg  300 mcg Subcutaneous Once Lequita Asal, MD       Facility-Administered Medications Ordered in Other Visits  Medication Dose Route Frequency Provider Last Rate Last Dose  . sodium chloride 0.9 % injection 10 mL  10 mL Intracatheter PRN Lequita Asal, MD      . sodium chloride 0.9 % injection 10 mL  10 mL Intravenous PRN Lequita Asal, MD   10 mL at 04/13/15 6734    Review of Systems:  GENERAL:  Feels a little dizzy/lightheaded.  No fever, chills or sweats.  Weight down 6 pounds. PERFORMANCE STATUS (ECOG):  1 HEENT:  No visual changes, sore throat, mouth sores or tenderness. Lungs:  No shortness of breath or cough.  No hemoptysis. Cardiac:  No chest pain, palpitations, orthopnea, or PND.  Took BP pill this AM. GI:  No abdominal pain.  No nausea, vomiting, diarrhea, melena or  hematochezia. GU:  No urgency, frequency, dysuria, or hematuria. Musculoskeletal:  Severe rheumatoid arthritis (off MTX and on prednisone).  Osteoporosis.  No back pain. No muscle tenderness. Extremities:  No pain or swelling. Skin:  No increased bruising or bleeding.  No rashes or skin changes. Neuro:  Neuropathy (stable).  No headache, numbness or weakness, balance or coordination issues. Endocrine:  Diabetes on an insulin pump.  Occasional hot flashes.  Psych:  No mood changes, depression or anxiety. Pain: No pain. Review of systems:  All other systems reviewed and found to be negative.  Physical Exam: Blood pressure (!) 83/55, pulse 90, temperature 97.7 F (36.5 C), temperature source Tympanic, weight 136 lb 9.6 oz (62 kg). GENERAL:  Well developed, well nourished woman sitting comfortably in the exam room in no acute distress. MENTAL STATUS:  Alert and oriented to person, place and time. HEAD:  Curly gray hair.  Normocephalic, atraumatic, face symmetric, no Cushingoid features. EYES:  Gold rimmed glasses.  Blue eyes.  No conjunctivitis or scleral icterus.  ENT: Oropharynx clear without lesion. Tongue normal. Mucous membranes moist.  RESPIRATORY: Clear to auscultation without rales, wheezes or rhonchi. CARDIOVASCULAR: Regular rate and rhythm without murmur, rub or gallop. ABDOMEN: Soft, fully round, non-tender with active bowel sounds and no hepatosplenomegaly. No palpable nodularity or masses.  Insulin pump.  Ostomy bag. SKIN: Bruising on abdomen s/p Lovenox injections. No rashes or ulcers. EXTREMITIES:  No edema, skin discoloration or tenderness. No palpable cords. LYMPH NODES: No palpable cervical, supraclavicular, axillary or inguinal adenopathy  NEUROLOGICAL: Appropriate. PSYCH: Appropriate.   Radiology studies: 02/13/2015:  Abdomen and pelvic CT revealed bilateral mass-like adnexal regions (right adnexal mass 5.6 x 5.0 cm and the left adnexa mass 10.3 x 6.0 x 9.9  cm).  There was a large amount of soft tissue throughout the peritoneal cavity involving the omentum and other peritoneal surfaces.  There was a small volume ascites. There was a peripheral 3.3 x 1.9 cm low-attenuation lesion overlying the right lobe of the liver, likely representing a serosal implant. There was 1.4 x 2.2 cm ill-defined peripheral lesion within the inferior aspect of segment 6 adjacent to the ampulla in the duodenum.  04/28/2015:  Abdominal and pelvic CT revealed decreasing bilateral ovarian masses.   The left adnexal mass measured 4.0 x 6.1 cm (previously 5.0 x 8.5 cm). The right ovary measured 3.8 x 4.9 cm (previously 4.7 x 5.6 cm).  There was improved peritoneal carcinomatosis.  There was a small amount of ascites.  The hepatic dome lesion was stable. The previously seen right hepatic lobe lesion was not well-visualized.  There was a small left pleural effusion and trace right pleural effusion.  The nodular lesion at the ampulla of Vater, extending into the duodenum was stable.  There was no biliary ductal dilatation. 08/01/2015:  Rght upper extremity ultrasound revealed a near occlusive thrombus within the central portion of the right internal jugular vein and central portion of the right subclavian vein. 09/01/2015:  Chest, abdomen, and pelvic CT revealed continued decrease in perihepatic fluid collection contiguous with the right pleural space with percutaneous drain.  09/11/2015:  Chest, abdomen, and pelvic CT revealed a residual versus recurrent fluid collection posterior to the right hepatic lobe (2.6 x 1.1 x 4.0 cm).   10/13/2015:  Chest, abdomen, and pelvic CT revealed resolution of the empyema. 02/15/2016:  Chest, abdomen, and pelvic CT revealed multiple soft tissue nodules throughout the pelvis, small bowel mesenteric and peritoneum and, concerning for peritoneal metastatic disease.  There was soft tissue irregularity within the left upper quadrant.  There was mild right  hydronephrosis (etiology unclear).  There was interval resolution of previously described right pleural based fluid and gas collection. There was a pleural based nodule within the right lower hemi-thorax concerning for pleural based metastasis.  There were small bilateral pleural effusions.  There was pulmonary nodularity, predominately within the left upper lobe (metastatic or infectious/inflammatory etiology).  There was slightly increased mediastinal adenopathy (infectious/inflammatory or metastatic).  There was a low attenuation lesion within the left hepatic lobe (complicated fluid within the fissure or metastatic disease). 03/08/2016:  Abdomen and pelvic CT revealed mild progression of peritoneal disease in the abdomen.  There was interval increase in loculated fluid around the lateral segment left liver, stomach, and spleen.  There was persistent soft tissue lesion at the level of the ampulla with mild intra and extrahepatic biliary duct dilatation.  There was persistent intrahepatic and capsular metastatic disease involving the liver. 05/26/2016:  Head MRI revealed multiple small areas of acute infarct involving the occipital parietal lobe bilaterally and the left lateral cerebellum,  consistent with posterior circulation emboli.  06/10/2016:  Adomen and pelvic CT revealed progression of peritoneal carcinomatosis predominantly in the perihepatic space, bilateral lower quadrants and pelvis.  There was worsening bilateral obstructive uropathy due to malignant involvement of the pelvic ureters.  There was stable small pleural/subpleural nodules at the right lung base.  There was stable small left pleural effusion.  There was decreased small volume perihepatic ascites.  08/09/2016:  Abdomen and pelvic CT revealed interval increase and loculated fluid collections within the peritoneal space consistent with progression of peritoneal ovarian carcinoma metastasis.  There was one large 7 cm collection in the  RIGHT lower quadrant which had increased significantly in size and may represent of point of small bowel obstruction.  The proximal stomach and duodenum were decompressed. The mid small bowel was mildly dilated. There was concern for obstruction given the large intraperitoneal fluid collection.  There was increase in subcapsular hepatic metastatic fluid collections.  There was enlargement of rounded ampullary lesion in the second portion duodenum concerning for metastatic lesion.  There was mild biliary duct  dilatation (stable).  There was nodular pleural metastasis in the RIGHT lower lobe pleural space.   Appointment on 08/17/2016  Component Date Value Ref Range Status  . WBC 08/17/2016 4.7  3.6 - 11.0 K/uL Final  . RBC 08/17/2016 3.25* 3.80 - 5.20 MIL/uL Final  . Hemoglobin 08/17/2016 10.2* 12.0 - 16.0 g/dL Final  . HCT 08/17/2016 29.6* 35.0 - 47.0 % Final  . MCV 08/17/2016 91.1  80.0 - 100.0 fL Final  . MCH 08/17/2016 31.3  26.0 - 34.0 pg Final  . MCHC 08/17/2016 34.3  32.0 - 36.0 g/dL Final  . RDW 08/17/2016 14.5  11.5 - 14.5 % Final  . Platelets 08/17/2016 302  150 - 440 K/uL Final  . Neutrophils Relative % 08/17/2016 78  % Final  . Neutro Abs 08/17/2016 3.7  1.4 - 6.5 K/uL Final  . Lymphocytes Relative 08/17/2016 11  % Final  . Lymphs Abs 08/17/2016 0.5* 1.0 - 3.6 K/uL Final  . Monocytes Relative 08/17/2016 10  % Final  . Monocytes Absolute 08/17/2016 0.4  0.2 - 0.9 K/uL Final  . Eosinophils Relative 08/17/2016 0  % Final  . Eosinophils Absolute 08/17/2016 0.0  0 - 0.7 K/uL Final  . Basophils Relative 08/17/2016 1  % Final  . Basophils Absolute 08/17/2016 0.0  0 - 0.1 K/uL Final  . Sodium 08/17/2016 132* 135 - 145 mmol/L Final  . Potassium 08/17/2016 4.1  3.5 - 5.1 mmol/L Final  . Chloride 08/17/2016 99* 101 - 111 mmol/L Final  . CO2 08/17/2016 25  22 - 32 mmol/L Final  . Glucose, Bld 08/17/2016 227* 65 - 99 mg/dL Final  . BUN 08/17/2016 13  6 - 20 mg/dL Final  . Creatinine, Ser  08/17/2016 0.67  0.44 - 1.00 mg/dL Final  . Calcium 08/17/2016 9.1  8.9 - 10.3 mg/dL Final  . Total Protein 08/17/2016 6.9  6.5 - 8.1 g/dL Final  . Albumin 08/17/2016 3.3* 3.5 - 5.0 g/dL Final  . AST 08/17/2016 20  15 - 41 U/L Final  . ALT 08/17/2016 24  14 - 54 U/L Final  . Alkaline Phosphatase 08/17/2016 132* 38 - 126 U/L Final  . Total Bilirubin 08/17/2016 0.2* 0.3 - 1.2 mg/dL Final  . GFR calc non Af Amer 08/17/2016 >60  >60 mL/min Final  . GFR calc Af Amer 08/17/2016 >60  >60 mL/min Final   Comment: (NOTE) The eGFR has been calculated using the CKD EPI equation. This calculation has not been validated in all clinical situations. eGFR's persistently <60 mL/min signify possible Chronic Kidney Disease.   . Anion gap 08/17/2016 8  5 - 15 Final  . CA 125 08/17/2016 65.8* 0.0 - 38.1 U/mL Final   Comment: (NOTE) [employer] ECLIA methodology Performed At: [facility] [place], [place] [id number] Lindon Romp MD GL:[id number]   . Magnesium 08/17/2016 1.3* 1.7 - 2.4 mg/dL Final    Assessment:  [name] is a 71 y.o. female with progressive stage IV ovarian cancer.  She presented with abdominal discomfort and bloating.  Omental biopsy on 02/23/2015 revealed metastatic high grade serous carcinoma, consistent with gynecologic origin.   She was initially diagnosed with clinical stage IIIC (T3cN1Mx).  CA125 was 707 on 02/17/2015.  She received 4 cycles of neoadjuvant carboplatin and Taxol (03/05/2015 - 05/22/2015).  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  She had loose stools on oral magnesium.  She was initially on Neurontin then switched Lyrica with cycle #3.  Cycle #  4 was notable for neutropenia (ANC 300) requiring GCSF x 3 days.    She received tamoxifen from 02/25/2016 - 03/14/2016.  She received 4 cycles of carboplatin and gemcitabine (03/21/2016 - 05/24/2016) with GCSF/Neulasta support.  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  She received 2 cycles of Doxil (06/21/2016 - 07/19/2016) with Neulasta support.   CA125 was 802.9 on 03/30/2015, 567.9 on 04/13/2015, 168.8 on 05/15/2015, 85.2 on 07/17/2015, 68.6 on 07/28/2015, 34.5 on 09/17/2015, 20.7 on 11/06/2015, 49 on 01/22/2016, 106.1 on 02/22/2016, 138.8 on 03/07/2016, 220.8 on 03/21/2016, 152.8 on 04/11/2016, 125.6 on 04/18/2016, 76.8 on 05/03/2016, 60.2 on 05/24/2016, 44 on 07/19/2016, and 65.8 on 08/17/2016.  She underwent exploratory laparotomy, lysis of adhesions, total abdominal hysterectomy with bilateral salpingo-oophorectomy, infracolic omentectomy, optimal tumor debulking(< 1 cm), recto-sigmoid resection with creation of end colostomy, cholecystectomy, mobilization of splenic flexure and liver with diaphragmatic stripping on 06/15/2015. The right diaphragm was cleared of tumor. During dissection, the diaphragm was entered and closed with sutures.   She had a recurrent right sided pleural effusion.  She underwent thoracentesis of 650 cc on post-operative day 3.  She was admitted to [facility] on 07/01/2015 and 07/07/2015 for recurrent shortness of breath.  She underwent 2 additional thoracenteses (1.1 L on 07/02/2015 and 850 cc on 07/08/2015).  Cytology was negative x 2.  Bilateral lower extremity duplex on 07/03/2015 was negative.  Echo revealed an EF of 55-60% on 07/08/2015 and 60-65% on 05/27/2016.    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 She was on Lovenox 60 mg twice a day.  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  She was admitted to [facility] from 07/29/2015 - 08/10/2015 with a right-sided empyema and liver abscess. She underwent CT-guided placement of a liver abscess drain on 07/30/2015.  Liver abscess culture grew out group B strep and Enterobacter which was  sensitive to Zosyn. She was transitioned to ertapenem Colbert Ewing) prior to discharge.  She was readmitted to [facility] on 09/17/2015.  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  Head MRI on 05/26/2016 revealed multiple small areas of acute infarct involving the occipital parietal lobe bilaterally and the left lateral cerebellum,  consistent with posterior circulation emboli.  Work-up included a negative carotid ultrasound and echocardiogram.  She was admitted to [facility] from 08/10/2016 - 08/14/2016 with a small bowel obstruction.  She was managed conservatively.  She has severe rheumatoid arthritis.  Methotrexate and Enbrel were initially on hold.  She has a normocytic anemia.  Work-up on 02/17/2015 and 05/24/2016 revealed a normal ferritin, B12, folate, TSH.  She denies any melena or hematochezia.    She has anemia due to chronic disease. She received 1 unit PRBCs during her admission at [facility]. She denies any melena or hematochezia. She has diabetes and is on an insulin pump.  She has had persistent neutropenia felt secondary to her rheumatoid arthritis.  Folate and MMA were normal.  TSH was 6.13 (high) with a free T4 of 1.17 (0.61-1.12).  She began methotrexate (10 mg a week) and prednisone (5 mg a day) for severe rheumatoid arthritis on 12/17/2015.  She remains on prednisone alone (5 mg a day).  Bone marrow aspirate and biopsy on 06/09/2010 revealed a hypercellular marrow (70%) with no evidence  of dysplasia or malignancy.  Flow cytometry was negative.  Cytogenetics were normal (46,XX).  FISH studies were negative for MDS.  Bone marrow aspirate and biopsy on 12/03/2015 revealed a normocellular to mildy hypercellular marrow for age (40%) with left shifted myelopoiesis, non specific dyserythropoiesis and mild megakaryocytic atypia with no increase in blasts.  There were multiple small nonspecific lymphoid aggregates (favor reactive). There was no  increase in reticulin.  There was decreased myeloid cells (37%) with left shifted maturation and 1% atypical myelod blasts.  There was relatively increased monocytic cells (11%), relatively increased lymphoid cells (36%), and relatively increased eosinophils (6%).  Cytogenetics were normal (72, XX).  SNP microarray was normal.  Abdomen and pelvic CT scan on 08/09/2016 revealed interval increase and loculated fluid collections within the peritoneal space consistent with progression of peritoneal ovarian carcinoma metastasis.  There was one large 7 cm collection in the RIGHT lower quadrant which had increased significantly in size and may represent of point of small bowel obstruction.  There was increase in subcapsular hepatic metastatic fluid collections.  There was enlargement of rounded ampullary lesion in the second portion duodenum concerning for metastatic lesion.  There was mild biliary duct dilatation (stable).  There was nodular pleural metastasis in the RIGHT lower lobe pleural space.  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  She receives IV magnesium weekly.  Code status is DNR/DNI.  Symptomatically, she feels dizzy.  Blood pressure is low.  Counts are good.  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  Plan: 1.  Discuss interim events with bowel obstruction and progression noted on CT scan.  Discuss patient's thoughts about therapy.  Patient would like to try additional chemotherapy.  Discuss multiple treatment options.  Discuss persistent neuropathy.  Discuss weekly versus 5 days of topotecan every 3 weeks.  Side effects reviewed.  Discussed need for GCSF or Neulasta support given chronic bone marrow issues.  Discuss initial studies and response rates of weekly versus 5 days every 3 weeks.  Discuss increase myelosuppression with daily x 5, but initial study revealing increased response rate (19% versus 9%; not replicated with 2nd study).  Response rate  estimated at 20%.   2.  Discuss treatment is palliative.  Goals of care discussed.  Discussed code status.  Patient does not want heroic measures.  Code status is DNR/DNI. 3.  Discuss today's blood pressure.  Discuss IVF.  Discuss holding blood pressure medications. 4.  IVF today- start 250 cc repeat orthostatics then possibly additional fluids 5.  RTC on 08/22/2016 for MD assessment, labs (CBC with diff, CMP, Mg) and cycle #1 topotecan (receives Mon-Fri) with On-Pro Neulasta support   Lequita Asal, MD  08/19/2016

## 2016-08-19 NOTE — Progress Notes (Signed)
Patient here for follow up. Her BP is low today. She states she took a "water" pill last night for bloating and took her BP meds this am without checking her BP at home her BP was 84/41.

## 2016-08-21 ENCOUNTER — Encounter: Payer: Self-pay | Admitting: Hematology and Oncology

## 2016-08-21 ENCOUNTER — Other Ambulatory Visit: Payer: Self-pay | Admitting: Hematology and Oncology

## 2016-08-22 ENCOUNTER — Other Ambulatory Visit: Payer: Self-pay | Admitting: Hematology and Oncology

## 2016-08-22 ENCOUNTER — Inpatient Hospital Stay (HOSPITAL_BASED_OUTPATIENT_CLINIC_OR_DEPARTMENT_OTHER): Payer: Medicare Other | Admitting: Hematology and Oncology

## 2016-08-22 ENCOUNTER — Encounter: Payer: Self-pay | Admitting: Hematology and Oncology

## 2016-08-22 ENCOUNTER — Inpatient Hospital Stay: Payer: Medicare Other

## 2016-08-22 VITALS — BP 123/61 | HR 75 | Temp 96.0°F | Resp 18 | Wt 139.1 lb

## 2016-08-22 DIAGNOSIS — R1900 Intra-abdominal and pelvic swelling, mass and lump, unspecified site: Secondary | ICD-10-CM | POA: Diagnosis not present

## 2016-08-22 DIAGNOSIS — C801 Malignant (primary) neoplasm, unspecified: Secondary | ICD-10-CM

## 2016-08-22 DIAGNOSIS — C569 Malignant neoplasm of unspecified ovary: Secondary | ICD-10-CM

## 2016-08-22 DIAGNOSIS — I2510 Atherosclerotic heart disease of native coronary artery without angina pectoris: Secondary | ICD-10-CM

## 2016-08-22 DIAGNOSIS — C786 Secondary malignant neoplasm of retroperitoneum and peritoneum: Secondary | ICD-10-CM

## 2016-08-22 DIAGNOSIS — K222 Esophageal obstruction: Secondary | ICD-10-CM | POA: Diagnosis not present

## 2016-08-22 DIAGNOSIS — I6340 Cerebral infarction due to embolism of unspecified cerebral artery: Secondary | ICD-10-CM | POA: Diagnosis not present

## 2016-08-22 DIAGNOSIS — Z7689 Persons encountering health services in other specified circumstances: Secondary | ICD-10-CM | POA: Diagnosis not present

## 2016-08-22 DIAGNOSIS — I5032 Chronic diastolic (congestive) heart failure: Secondary | ICD-10-CM

## 2016-08-22 DIAGNOSIS — E785 Hyperlipidemia, unspecified: Secondary | ICD-10-CM

## 2016-08-22 DIAGNOSIS — D61818 Other pancytopenia: Secondary | ICD-10-CM

## 2016-08-22 DIAGNOSIS — C7800 Secondary malignant neoplasm of unspecified lung: Secondary | ICD-10-CM

## 2016-08-22 DIAGNOSIS — E876 Hypokalemia: Secondary | ICD-10-CM

## 2016-08-22 DIAGNOSIS — K219 Gastro-esophageal reflux disease without esophagitis: Secondary | ICD-10-CM

## 2016-08-22 DIAGNOSIS — Z66 Do not resuscitate: Secondary | ICD-10-CM

## 2016-08-22 DIAGNOSIS — Z90710 Acquired absence of both cervix and uterus: Secondary | ICD-10-CM

## 2016-08-22 DIAGNOSIS — Z90722 Acquired absence of ovaries, bilateral: Secondary | ICD-10-CM

## 2016-08-22 DIAGNOSIS — I998 Other disorder of circulatory system: Secondary | ICD-10-CM

## 2016-08-22 DIAGNOSIS — Z8701 Personal history of pneumonia (recurrent): Secondary | ICD-10-CM

## 2016-08-22 DIAGNOSIS — Z794 Long term (current) use of insulin: Secondary | ICD-10-CM

## 2016-08-22 DIAGNOSIS — M069 Rheumatoid arthritis, unspecified: Secondary | ICD-10-CM

## 2016-08-22 DIAGNOSIS — Z79899 Other long term (current) drug therapy: Secondary | ICD-10-CM | POA: Diagnosis not present

## 2016-08-22 DIAGNOSIS — Z5111 Encounter for antineoplastic chemotherapy: Secondary | ICD-10-CM

## 2016-08-22 DIAGNOSIS — Z7952 Long term (current) use of systemic steroids: Secondary | ICD-10-CM

## 2016-08-22 DIAGNOSIS — I252 Old myocardial infarction: Secondary | ICD-10-CM

## 2016-08-22 DIAGNOSIS — D649 Anemia, unspecified: Secondary | ICD-10-CM

## 2016-08-22 DIAGNOSIS — Z7901 Long term (current) use of anticoagulants: Secondary | ICD-10-CM

## 2016-08-22 DIAGNOSIS — E108 Type 1 diabetes mellitus with unspecified complications: Secondary | ICD-10-CM

## 2016-08-22 DIAGNOSIS — Z87891 Personal history of nicotine dependence: Secondary | ICD-10-CM

## 2016-08-22 DIAGNOSIS — Z7189 Other specified counseling: Secondary | ICD-10-CM

## 2016-08-22 DIAGNOSIS — K449 Diaphragmatic hernia without obstruction or gangrene: Secondary | ICD-10-CM

## 2016-08-22 DIAGNOSIS — I10 Essential (primary) hypertension: Secondary | ICD-10-CM

## 2016-08-22 LAB — CBC WITH DIFFERENTIAL/PLATELET
Basophils Absolute: 0.1 10*3/uL (ref 0–0.1)
Basophils Relative: 1 %
Eosinophils Absolute: 0 10*3/uL (ref 0–0.7)
Eosinophils Relative: 0 %
HCT: 26.8 % — ABNORMAL LOW (ref 35.0–47.0)
Hemoglobin: 9.2 g/dL — ABNORMAL LOW (ref 12.0–16.0)
Lymphocytes Relative: 10 %
Lymphs Abs: 0.4 10*3/uL — ABNORMAL LOW (ref 1.0–3.6)
MCH: 31.2 pg (ref 26.0–34.0)
MCHC: 34.5 g/dL (ref 32.0–36.0)
MCV: 90.5 fL (ref 80.0–100.0)
Monocytes Absolute: 0.6 10*3/uL (ref 0.2–0.9)
Monocytes Relative: 17 %
Neutro Abs: 2.6 10*3/uL (ref 1.4–6.5)
Neutrophils Relative %: 72 %
Platelets: 240 10*3/uL (ref 150–440)
RBC: 2.96 MIL/uL — ABNORMAL LOW (ref 3.80–5.20)
RDW: 14.6 % — ABNORMAL HIGH (ref 11.5–14.5)
WBC: 3.6 10*3/uL (ref 3.6–11.0)

## 2016-08-22 LAB — COMPREHENSIVE METABOLIC PANEL
ALT: 39 U/L (ref 14–54)
AST: 34 U/L (ref 15–41)
Albumin: 3 g/dL — ABNORMAL LOW (ref 3.5–5.0)
Alkaline Phosphatase: 248 U/L — ABNORMAL HIGH (ref 38–126)
Anion gap: 8 (ref 5–15)
BUN: 10 mg/dL (ref 6–20)
CO2: 28 mmol/L (ref 22–32)
Calcium: 8.6 mg/dL — ABNORMAL LOW (ref 8.9–10.3)
Chloride: 99 mmol/L — ABNORMAL LOW (ref 101–111)
Creatinine, Ser: 0.64 mg/dL (ref 0.44–1.00)
GFR calc Af Amer: >60 mL/min (ref >60)
GFR calc non Af Amer: >60 mL/min (ref >60)
Glucose, Bld: 169 mg/dL — ABNORMAL HIGH (ref 65–99)
Potassium: 3.8 mmol/L (ref 3.5–5.1)
Sodium: 135 mmol/L (ref 135–145)
Total Bilirubin: 0.4 mg/dL (ref 0.3–1.2)
Total Protein: 6.4 g/dL — ABNORMAL LOW (ref 6.5–8.1)

## 2016-08-22 LAB — IRON AND TIBC
Iron: 32 ug/dL (ref 28–170)
Saturation Ratios: 11 % (ref 10.4–31.8)
TIBC: 281 ug/dL (ref 250–450)
UIBC: 249 ug/dL

## 2016-08-22 LAB — MAGNESIUM: Magnesium: 1.4 mg/dL — ABNORMAL LOW (ref 1.7–2.4)

## 2016-08-22 LAB — FOLATE: Folate: 54.6 ng/mL (ref >5.9)

## 2016-08-22 LAB — FERRITIN: Ferritin: 37 ng/mL (ref 11–307)

## 2016-08-22 MED ORDER — SODIUM CHLORIDE 0.9% FLUSH
10.0000 mL | Freq: Once | INTRAVENOUS | Status: AC
  Administered 2016-08-22: 10 mL via INTRAVENOUS
  Filled 2016-08-22: qty 10

## 2016-08-22 MED ORDER — POTASSIUM CHLORIDE CRYS ER 20 MEQ PO TBCR: 20.0000 meq | EXTENDED_RELEASE_TABLET | Freq: Every day | ORAL | 0 refills | Status: DC

## 2016-08-22 MED ORDER — HEPARIN SOD (PORK) LOCK FLUSH 100 UNIT/ML IV SOLN: 500.0000 [IU] | Freq: Once | INTRAVENOUS | Status: DC | PRN

## 2016-08-22 MED ORDER — ONDANSETRON HCL 4 MG PO TABS
8.0000 mg | ORAL_TABLET | Freq: Once | ORAL | Status: AC
  Administered 2016-08-22: 8 mg via ORAL
  Filled 2016-08-22: qty 2

## 2016-08-22 MED ORDER — MAGNESIUM SULFATE 4 GM/100ML IV SOLN
4.0000 g | Freq: Once | INTRAVENOUS | Status: AC
  Administered 2016-08-22: 4 g via INTRAVENOUS
  Filled 2016-08-22: qty 100

## 2016-08-22 MED ORDER — HEPARIN SOD (PORK) LOCK FLUSH 100 UNIT/ML IV SOLN
500.0000 [IU] | Freq: Once | INTRAVENOUS | Status: AC
  Administered 2016-08-22: 500 [IU] via INTRAVENOUS
  Filled 2016-08-22: qty 5

## 2016-08-22 MED ORDER — SODIUM CHLORIDE 0.9 % IV SOLN
1.2500 mg/m2 | Freq: Once | INTRAVENOUS | Status: AC
  Administered 2016-08-22: 2.1 mg via INTRAVENOUS
  Filled 2016-08-22: qty 2.1

## 2016-08-22 MED ORDER — SODIUM CHLORIDE 0.9 % IV SOLN
Freq: Once | INTRAVENOUS | Status: AC
  Administered 2016-08-22: 12:00:00 via INTRAVENOUS
  Filled 2016-08-22: qty 1000

## 2016-08-22 NOTE — Progress Notes (Signed)
[place] day:  08/22/2016   Chief Complaint: [name] is a 71 y.o. female with stage IV ovarian cancer who is seen for assessment prior to cycle #1 topotecan.  HPI:  The patient was last seen in the medical oncology clinic on 08/19/2016.  At that time, she was feeling dizzy. Her blood pressure was low. Blood pressure medications were held. She received IV fluids. We discussed the plans for salvage chemotherapy with topotecan. Side effects were reviewed.   During the interim, she has done well.  She no longer feels dizzy.  Blood pressure medications have been held.   Past Medical History:  Diagnosis Date  . CAD (coronary artery disease)    a. 09/2012 Cath: LM nl, LAD 95p, 43m LCX 928mOM2 50, RCA 100.  . Marland Kitchenholelithiasis   . Chronic diastolic CHF (congestive heart failure) ([facility]  . Collagen vascular disease ([facility]  . Esophageal stricture   . Exertional shortness of breath   . GERD (gastroesophageal reflux disease)   . H/O hiatal hernia   . Herniated disc   . History of pancytopenia   . Hyperlipidemia   . Hypertension   . Hypokalemia   . Leukopenia 2012   s/p bone marrow biopsy, Dr. PaMa Hillock. NSTEMI (non-ST elevated myocardial infarction) (HC[facility]5/2014   "mild" (10/18/2012)  . Ovarian cancer ([facility]2016   chemo  . Pneumonia 2013; 08/2012   "one lung; double" (10/18/2012)  . Rheumatoid arthritis(714.0)   . Type I diabetes mellitus ([facility]   "dx'd in 1957" (10/18/2012)    Past Surgical History:  Procedure Laterality Date  . ABDOMINAL HYSTERECTOMY  06/15/2015   Dx L/S, EXLAP TAH BSO omentectomy RSRx colostomy diaphragm resection stripping  . CARDIAC CATHETERIZATION  10/18/2012   "first one was today" (10/18/2012)  . CATARACT EXTRACTION W/ INTRAOCULAR LENS IMPLANT Right 2010  . CHOLECYSTECTOMY  06/15/2015   combined case with ovarian cancer debulking  . COLON SURGERY    . CORONARY ARTERY BYPASS GRAFT N/A 10/19/2012   Procedure:  CORONARY ARTERY BYPASS GRAFTING (CABG);  Surgeon: StMelrose NakayamaMD;  [facility]: [facility] Service: Open [facility] Surgery;  Laterality: N/A;  . ESOPHAGEAL DILATION     "3 or 4 times" (10/19/2011)  . ESOPHAGOGASTRODUODENOSCOPY  2012   Dr. IfMinna Merritts. OSTOMY    . OVARY SURGERY     removal  . PERIPHERAL VASCULAR CATHETERIZATION N/A 03/02/2015   Procedure: PoGlori Luisath Insertion;  Surgeon: JaAlgernon HuxleyMD;  [facility]: [facility];  Service: Cardiovascular;  Laterality: N/A;  . TUBAL LIGATION  1970    Family History  Problem Relation Age of Onset  . Diabetes Mother   . Arthritis Mother   . Diabetes Father   . Arthritis Father   . Bone cancer Sister     Social History:  reports that she quit smoking about 22 years ago. Her smoking use included Cigarettes. She has a 30.00 pack-year smoking history. She has never used smokeless tobacco. She reports that she does not drink alcohol or use drugs.  She is accompanied by her husband today.  Allergies:  Allergies  Allergen Reactions  . Codeine Nausea And Vomiting  . Latex Rash    Current Medications: Current Outpatient Prescriptions  Medication Sig Dispense Refill  . Calcium-Vitamin D 600-200 MG-UNIT tablet Take 1 tablet by mouth 2 (two) times daily.     . cholecalciferol (VITAMIN D) 1000 units tablet  Take 1,000 Units by mouth daily.    Marland Kitchen enoxaparin (LOVENOX) 60 MG/0.6ML injection INJECT 0.6 ML (60 MG TOTAL) INTO THE SKIN EVERY 12 HOURS 36 mL 2  . folic acid (FOLVITE) 1 MG tablet TAKE 1 TABLET BY MOUTH ONCE A DAY 90 tablet 3  . insulin lispro (HUMALOG) 100 UNIT/ML injection Inject 0.06 mLs (6 Units total) into the skin daily. (Patient taking differently: Inject 0-85 Units into the skin daily. Via insulin pump) 30 mL 3  . lidocaine-prilocaine (EMLA) cream APPLY 1 APPLICATION TOPICALLY AS NEEDED 1 HOUR PRIOR TO TREATMENT. COVER WITH PRESS N SEAL UNTIL TREATMENT TIME AS DIRECTED 30 g 1  . loratadine (CLARITIN) 10 MG tablet Take 1 tablet  by mouth daily as needed.    . magnesium oxide (MAG-OX) 400 (241.3 Mg) MG tablet TAKE 1 TABLET BY MOUTH ONCE DAILY 30 tablet 0  . metoprolol tartrate (LOPRESSOR) 25 MG tablet Take 1 tablet (25 mg total) by mouth 2 (two) times daily. 180 tablet 3  . ondansetron (ZOFRAN) 8 MG tablet TAKE 1 TABLET BY MOUTH EVERY 8 HOURS AS NEEDED FOR NAUSEA OR VOMITING 30 tablet 1  . ONE TOUCH ULTRA TEST test strip     . pantoprazole (PROTONIX) 40 MG tablet Take 1 tablet (40 mg total) by mouth 2 (two) times daily. 180 tablet 1  . potassium chloride SA (K-DUR,KLOR-CON) 20 MEQ tablet Take 1 tablet (20 mEq total) by mouth daily. 30 tablet 0  . predniSONE (DELTASONE) 5 MG tablet Take 5 mg by mouth daily with breakfast.    . ranitidine (ZANTAC) 150 MG tablet Take 1 tablet (150 mg total) by mouth 2 (two) times daily. 180 tablet 1  . vitamin C (ASCORBIC ACID) 500 MG tablet Take 500 mg by mouth daily.    Marland Kitchen zinc gluconate 50 MG tablet Take 50 mg by mouth daily.    . furosemide (LASIX) 80 MG tablet Take 0.5 tablets by mouth 2 (two) times daily.    Marland Kitchen losartan (COZAAR) 25 MG tablet Take 1 tablet (25 mg total) by mouth daily. (Patient not taking: Reported on 08/22/2016) 90 tablet 2   Current Facility-Administered Medications  Medication Dose Route Frequency Provider Last Rate Last Dose  . Tbo-Filgrastim (GRANIX) injection 300 mcg  300 mcg Subcutaneous Once Lequita Asal, MD       Facility-Administered Medications Ordered in Other Visits  Medication Dose Route Frequency Provider Last Rate Last Dose  . heparin lock flush 100 unit/mL  500 Units Intravenous Once Lequita Asal, MD      . sodium chloride 0.9 % injection 10 mL  10 mL Intracatheter PRN Lequita Asal, MD      . sodium chloride 0.9 % injection 10 mL  10 mL Intravenous PRN Lequita Asal, MD   10 mL at 04/13/15 1572    Review of Systems:  GENERAL:  Feels fine.  No fever, chills or sweats.  Weight up 3 pounds. PERFORMANCE STATUS (ECOG):  1 HEENT:   No visual changes, sore throat, mouth sores or tenderness. Lungs:  No shortness of breath or cough.  No hemoptysis. Cardiac:  No chest pain, palpitations, orthopnea, or PND.  Took BP pill this AM. GI:  No abdominal pain.  No nausea, vomiting, diarrhea, melena or hematochezia. GU:  No urgency, frequency, dysuria, or hematuria. Musculoskeletal:  Severe rheumatoid arthritis (off MTX and on prednisone).  Osteoporosis.  No back pain. No muscle tenderness. Extremities:  No pain or swelling. Skin:  No increased  bruising or bleeding.  No rashes or skin changes. Neuro:  Neuropathy (stable).  No headache, numbness or weakness, balance or coordination issues. Endocrine:  Diabetes on an insulin pump.  Occasional hot flashes.  Psych:  No mood changes, depression or anxiety. Pain: No pain. Review of systems:  All other systems reviewed and found to be negative.  Physical Exam: Blood pressure 123/61, pulse 75, temperature (!) 96 F (35.6 C), temperature source Tympanic, resp. rate 18, weight 139 lb 2 oz (63.1 kg). GENERAL:  Well developed, well nourished woman sitting comfortably in the exam room in no acute distress. MENTAL STATUS:  Alert and oriented to person, place and time. HEAD:  Curly gray hair.  Normocephalic, atraumatic, face symmetric, no Cushingoid features. EYES:  Gold rimmed glasses.  Blue eyes.  No conjunctivitis or scleral icterus.  ENT: Oropharynx clear without lesion. Tongue normal. Mucous membranes moist.  RESPIRATORY: Clear to auscultation without rales, wheezes or rhonchi. CARDIOVASCULAR: Regular rate and rhythm without murmur, rub or gallop. ABDOMEN: Soft, fully round, non-tender with active bowel sounds and no hepatosplenomegaly. No palpable nodularity or masses.  Insulin pump.  Ostomy bag. SKIN: Bruising on abdomen s/p Lovenox injections. No rashes or ulcers. EXTREMITIES:  No edema, skin discoloration or tenderness. No palpable cords. LYMPH NODES: No palpable cervical,  supraclavicular, axillary or inguinal adenopathy  NEUROLOGICAL: Appropriate. PSYCH: Appropriate.   Radiology studies: 02/13/2015:  Abdomen and pelvic CT revealed bilateral mass-like adnexal regions (right adnexal mass 5.6 x 5.0 cm and the left adnexa mass 10.3 x 6.0 x 9.9 cm).  There was a large amount of soft tissue throughout the peritoneal cavity involving the omentum and other peritoneal surfaces.  There was a small volume ascites. There was a peripheral 3.3 x 1.9 cm low-attenuation lesion overlying the right lobe of the liver, likely representing a serosal implant. There was 1.4 x 2.2 cm ill-defined peripheral lesion within the inferior aspect of segment 6 adjacent to the ampulla in the duodenum.  04/28/2015:  Abdominal and pelvic CT revealed decreasing bilateral ovarian masses.   The left adnexal mass measured 4.0 x 6.1 cm (previously 5.0 x 8.5 cm). The right ovary measured 3.8 x 4.9 cm (previously 4.7 x 5.6 cm).  There was improved peritoneal carcinomatosis.  There was a small amount of ascites.  The hepatic dome lesion was stable. The previously seen right hepatic lobe lesion was not well-visualized.  There was a small left pleural effusion and trace right pleural effusion.  The nodular lesion at the ampulla of Vater, extending into the duodenum was stable.  There was no biliary ductal dilatation. 08/01/2015:  Rght upper extremity ultrasound revealed a near occlusive thrombus within the central portion of the right internal jugular vein and central portion of the right subclavian vein. 09/01/2015:  Chest, abdomen, and pelvic CT revealed continued decrease in perihepatic fluid collection contiguous with the right pleural space with percutaneous drain.  09/11/2015:  Chest, abdomen, and pelvic CT revealed a residual versus recurrent fluid collection posterior to the right hepatic lobe (2.6 x 1.1 x 4.0 cm).   10/13/2015:  Chest, abdomen, and pelvic CT revealed resolution of the empyema. 02/15/2016:   Chest, abdomen, and pelvic CT revealed multiple soft tissue nodules throughout the pelvis, small bowel mesenteric and peritoneum and, concerning for peritoneal metastatic disease.  There was soft tissue irregularity within the left upper quadrant.  There was mild right hydronephrosis (etiology unclear).  There was interval resolution of previously described right pleural based fluid and gas collection. There  was a pleural based nodule within the right lower hemi-thorax concerning for pleural based metastasis.  There were small bilateral pleural effusions.  There was pulmonary nodularity, predominately within the left upper lobe (metastatic or infectious/inflammatory etiology).  There was slightly increased mediastinal adenopathy (infectious/inflammatory or metastatic).  There was a low attenuation lesion within the left hepatic lobe (complicated fluid within the fissure or metastatic disease). 03/08/2016:  Abdomen and pelvic CT revealed mild progression of peritoneal disease in the abdomen.  There was interval increase in loculated fluid around the lateral segment left liver, stomach, and spleen.  There was persistent soft tissue lesion at the level of the ampulla with mild intra and extrahepatic biliary duct dilatation.  There was persistent intrahepatic and capsular metastatic disease involving the liver. 05/26/2016:  Head MRI revealed multiple small areas of acute infarct involving the occipital parietal lobe bilaterally and the left lateral cerebellum,  consistent with posterior circulation emboli.  06/10/2016:  Adomen and pelvic CT revealed progression of peritoneal carcinomatosis predominantly in the perihepatic space, bilateral lower quadrants and pelvis.  There was worsening bilateral obstructive uropathy due to malignant involvement of the pelvic ureters.  There was stable small pleural/subpleural nodules at the right lung base.  There was stable small left pleural effusion.  There was decreased small  volume perihepatic ascites.  08/09/2016:  Abdomen and pelvic CT revealed interval increase and loculated fluid collections within the peritoneal space consistent with progression of peritoneal ovarian carcinoma metastasis.  There was one large 7 cm collection in the RIGHT lower quadrant which had increased significantly in size and may represent of point of small bowel obstruction.  The proximal stomach and duodenum were decompressed. The mid small bowel was mildly dilated. There was concern for obstruction given the large intraperitoneal fluid collection.  There was increase in subcapsular hepatic metastatic fluid collections.  There was enlargement of rounded ampullary lesion in the second portion duodenum concerning for metastatic lesion.  There was mild biliary duct dilatation (stable).  There was nodular pleural metastasis in the RIGHT lower lobe pleural space.   Infusion on 08/22/2016  Component Date Value Ref Range Status  . WBC 08/22/2016 3.6  3.6 - 11.0 K/uL Final  . RBC 08/22/2016 2.96* 3.80 - 5.20 MIL/uL Final  . Hemoglobin 08/22/2016 9.2* 12.0 - 16.0 g/dL Final  . HCT 08/22/2016 26.8* 35.0 - 47.0 % Final  . MCV 08/22/2016 90.5  80.0 - 100.0 fL Final  . MCH 08/22/2016 31.2  26.0 - 34.0 pg Final  . MCHC 08/22/2016 34.5  32.0 - 36.0 g/dL Final  . RDW 08/22/2016 14.6* 11.5 - 14.5 % Final  . Platelets 08/22/2016 240  150 - 440 K/uL Final  . Neutrophils Relative % 08/22/2016 72  % Final  . Neutro Abs 08/22/2016 2.6  1.4 - 6.5 K/uL Final  . Lymphocytes Relative 08/22/2016 10  % Final  . Lymphs Abs 08/22/2016 0.4* 1.0 - 3.6 K/uL Final  . Monocytes Relative 08/22/2016 17  % Final  . Monocytes Absolute 08/22/2016 0.6  0.2 - 0.9 K/uL Final  . Eosinophils Relative 08/22/2016 0  % Final  . Eosinophils Absolute 08/22/2016 0.0  0 - 0.7 K/uL Final  . Basophils Relative 08/22/2016 1  % Final  . Basophils Absolute 08/22/2016 0.1  0 - 0.1 K/uL Final  . Sodium 08/22/2016 135  135 - 145 mmol/L Final   . Potassium 08/22/2016 3.8  3.5 - 5.1 mmol/L Final  . Chloride 08/22/2016 99* 101 - 111 mmol/L Final  .  CO2 08/22/2016 28  22 - 32 mmol/L Final  . Glucose, Bld 08/22/2016 169* 65 - 99 mg/dL Final  . BUN 08/22/2016 10  6 - 20 mg/dL Final  . Creatinine, Ser 08/22/2016 0.64  0.44 - 1.00 mg/dL Final  . Calcium 08/22/2016 8.6* 8.9 - 10.3 mg/dL Final  . Total Protein 08/22/2016 6.4* 6.5 - 8.1 g/dL Final  . Albumin 08/22/2016 3.0* 3.5 - 5.0 g/dL Final  . AST 08/22/2016 34  15 - 41 U/L Final  . ALT 08/22/2016 39  14 - 54 U/L Final  . Alkaline Phosphatase 08/22/2016 248* 38 - 126 U/L Final  . Total Bilirubin 08/22/2016 0.4  0.3 - 1.2 mg/dL Final  . GFR calc non Af Amer 08/22/2016 >60  >60 mL/min Final  . GFR calc Af Amer 08/22/2016 >60  >60 mL/min Final   Comment: (NOTE) The eGFR has been calculated using the CKD EPI equation. This calculation has not been validated in all clinical situations. eGFR's persistently <60 mL/min signify possible Chronic Kidney Disease.   . Anion gap 08/22/2016 8  5 - 15 Final  . Magnesium 08/22/2016 1.4* 1.7 - 2.4 mg/dL Final    Assessment:  SHAYDA KALKA is a 71 y.o. female with progressive stage IV ovarian cancer.  She presented with abdominal discomfort and bloating.  Omental biopsy on 02/23/2015 revealed metastatic high grade serous carcinoma, consistent with gynecologic origin.   She was initially diagnosed with clinical stage IIIC (T3cN1Mx).  CA125 was 707 on 02/17/2015.  She received 4 cycles of neoadjuvant carboplatin and Taxol (03/05/2015 - 05/22/2015).  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  She had loose stools on oral magnesium.  She was initially on Neurontin then switched Lyrica with cycle #3.  Cycle #4 was notable for neutropenia (ANC 300) requiring GCSF x 3 days.    She received tamoxifen from 02/25/2016 - 03/14/2016.  She received 4 cycles of carboplatin and gemcitabine (03/21/2016 - 05/24/2016) with GCSF/Neulasta support.  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  She received 2 cycles of Doxil (06/21/2016 - 07/19/2016) with Neulasta support.   CA125 was 802.9 on 03/30/2015, 567.9 on 04/13/2015, 168.8 on 05/15/2015, 85.2 on 07/17/2015, 68.6 on 07/28/2015, 34.5 on 09/17/2015, 20.7 on 11/06/2015, 49 on 01/22/2016, 106.1 on 02/22/2016, 138.8 on 03/07/2016, 220.8 on 03/21/2016, 152.8 on 04/11/2016, 125.6 on 04/18/2016, 76.8 on 05/03/2016, 60.2 on 05/24/2016, 44 on 07/19/2016, and 65.8 on 08/17/2016.  She underwent exploratory laparotomy, lysis of adhesions, total abdominal hysterectomy with bilateral salpingo-oophorectomy, infracolic omentectomy, optimal tumor debulking(< 1 cm), recto-sigmoid resection with creation of end colostomy, cholecystectomy, mobilization of splenic flexure and liver with diaphragmatic stripping on 06/15/2015. The right diaphragm was cleared of tumor. During dissection, the diaphragm was entered and closed with sutures.   She had a recurrent right sided pleural effusion.  She underwent thoracentesis of 650 cc on post-operative day 3.  She was admitted to [facility] on 07/01/2015 and 07/07/2015 for recurrent shortness of breath.  She underwent 2 additional thoracenteses (1.1 L on 07/02/2015 and 850 cc on 07/08/2015).  Cytology was negative x 2.  Bilateral lower extremity duplex on 07/03/2015 was negative.  Echo revealed an EF of 55-60% on 07/08/2015 and 60-65% on 05/27/2016.    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 She was on Lovenox 60 mg twice a day.  She switched  to Eliquis on 04/16/2016 then returned back to Lovenox after her CVA.  She was admitted to [facility] from 07/29/2015 - 08/10/2015 with a right-sided empyema and liver abscess. She underwent CT-guided placement of a liver abscess drain on 07/30/2015.  Liver abscess culture grew out  group B strep and Enterobacter which was sensitive to Zosyn. She was transitioned to ertapenem Colbert Ewing) prior to discharge.  She was readmitted to [facility] on 09/17/2015.  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  Head MRI on 05/26/2016 revealed multiple small areas of acute infarct involving the occipital parietal lobe bilaterally and the left lateral cerebellum,  consistent with posterior circulation emboli.  Work-up included a negative carotid ultrasound and echocardiogram.  She was admitted to [facility] from 08/10/2016 - 08/14/2016 with a small bowel obstruction.  She was managed conservatively.  She has severe rheumatoid arthritis.  Methotrexate and Enbrel were initially on hold.  She has a normocytic anemia.  Work-up on 02/17/2015 and 05/24/2016 revealed a normal ferritin, B12, folate, TSH.  She denies any melena or hematochezia.    She has anemia due to chronic disease. She received 1 unit PRBCs during her admission at [facility]. She denies any melena or hematochezia. She has diabetes and is on an insulin pump.  She has had persistent neutropenia felt secondary to her rheumatoid arthritis.  Folate and MMA were normal.  TSH was 6.13 (high) with a free T4 of 1.17 (0.61-1.12).  She began methotrexate (10 mg a week) and prednisone (5 mg a day) for severe rheumatoid arthritis on 12/17/2015.  She remains on prednisone alone (5 mg a day).  Bone marrow aspirate and biopsy on 06/09/2010 revealed a hypercellular marrow (70%) with no evidence of dysplasia or malignancy.  Flow cytometry was negative.  Cytogenetics were normal (46,XX).  FISH studies were negative for MDS.  Bone marrow aspirate and biopsy on 12/03/2015 revealed a normocellular to mildy hypercellular marrow for age (40%) with left shifted myelopoiesis, non specific dyserythropoiesis and mild megakaryocytic atypia with no increase in blasts.  There were multiple small nonspecific lymphoid  aggregates (favor reactive). There was no increase in reticulin.  There was decreased myeloid cells (37%) with left shifted maturation and 1% atypical myelod blasts.  There was relatively increased monocytic cells (11%), relatively increased lymphoid cells (36%), and relatively increased eosinophils (6%).  Cytogenetics were normal (89, XX).  SNP microarray was normal.  Abdomen and pelvic CT scan on 08/09/2016 revealed interval increase and loculated fluid collections within the peritoneal space consistent with progression of peritoneal ovarian carcinoma metastasis.  There was one large 7 cm collection in the RIGHT lower quadrant which had increased significantly in size and may represent of point of small bowel obstruction.  There was increase in subcapsular hepatic metastatic fluid collections.  There was enlargement of rounded ampullary lesion in the second portion duodenum concerning for metastatic lesion.  There was mild biliary duct dilatation (stable).  There was nodular pleural metastasis in the RIGHT lower lobe pleural space.  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  She receives IV magnesium weekly.  Code status is DNR/DNI.  Symptomatically, she feels back to baseline.  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  She has progressive anemia.  Plan: 1.  Labs today:  CBC with diff, CMP, Mg. 2.  Add anemia work-up (ferritin, iron studies, B12, folate). 3.  Review chemotherapy plan and side effects. 4.  Begin cycle #1 topotecan with On-Pro  Neulasta support. 5.  Magnesium 4 gm IV today. 6.  Labs  (Mg) on Friday +/- IV Mg 7.  RTC on 09/01/2016 for MD assessment, labs (CBC with diff, BMP, Mg) and +/- Mg.   Lequita Asal, MD  08/22/2016, 12:01 PM

## 2016-08-22 NOTE — Progress Notes (Signed)
Patient offers no complaints today.  Would like a dietary referral.

## 2016-08-22 NOTE — Telephone Encounter (Signed)
08/17/16  Potassium 3.5 - 5.1 mmol/L 4.1

## 2016-08-22 NOTE — Telephone Encounter (Signed)
Prescription sent to pharmacy.

## 2016-08-22 NOTE — Progress Notes (Signed)
Nutrition Assessment   Reason for Assessment:   Verbal referral from RN, Cecille Rubin regarding patient with nutrition questions  ASSESSMENT:  71 year old stage IV ovarian cancer.  Patient s/p exploratory lap with recto-sigmoid resection and end colostomy in 06/2015.  Recent small bowel obstruction this month with progression noted at [facility].  Past medical history of type 1 DM on insulin pump, HLD, HTN, NSTEMI, CHF, CAD  Patient reports good appetite.  Knows that she needs to consume high calorie, high protein foods.  Husband at chairside.  Patient with questions regarding low residue/low fiber diet as MD wanting her to follow this diet.    Nutrition Focused Physical Exam: deferred at this time  Medications: zinc, Vit C, predinsone, protonix, Mag ox  Labs: glucose 169  Anthropometrics:   Height: 63 inches Weight: 139 lb 2 oz UBW: 135-140s BMI: 24  Patient with relatively stable weight   Estimated Energy Needs  Kcals: 1600-1800 calories/d Protein: 74-93 g/d Fluid: 1.8 L/d  NUTRITION DIAGNOSIS: Food and nutrition related knowledge deficit related to recent small bowel obstruction as evidenced by patient with questions regarding diet and foods to eat.   MALNUTRITION DIAGNOSIS:  None at this time   INTERVENTION:   Answered patients questions regarding foods to eat on low fiber/low residue diet.  Encouraged use of oral nutrition supplements.  Talked about the difference in calories, proteins and carbohydrate amounts.  Encouraged high calorie, high protein supplement with additional bolus of insulin if weight starts to decrease.  Samples and coupons given.    MONITORING, EVALUATION, GOAL: Patient will consume adequate calories and protein to maintain weight   NEXT VISIT: May 3 during infusion  Corbett Moulder B. Zenia Resides, [place], [place] Registered Dietitian [phone] (pager)

## 2016-08-23 ENCOUNTER — Inpatient Hospital Stay: Payer: Medicare Other

## 2016-08-23 ENCOUNTER — Inpatient Hospital Stay: Payer: Medicare Other | Admitting: Hematology and Oncology

## 2016-08-23 ENCOUNTER — Ambulatory Visit: Payer: Medicare Other

## 2016-08-23 ENCOUNTER — Ambulatory Visit: Payer: Medicare Other | Admitting: Hematology and Oncology

## 2016-08-23 ENCOUNTER — Other Ambulatory Visit: Payer: Medicare Other

## 2016-08-23 DIAGNOSIS — C786 Secondary malignant neoplasm of retroperitoneum and peritoneum: Secondary | ICD-10-CM

## 2016-08-23 DIAGNOSIS — C569 Malignant neoplasm of unspecified ovary: Secondary | ICD-10-CM

## 2016-08-23 DIAGNOSIS — C801 Malignant (primary) neoplasm, unspecified: Secondary | ICD-10-CM

## 2016-08-23 MED ORDER — SODIUM CHLORIDE 0.9 % IV SOLN
1.2500 mg/m2 | Freq: Once | INTRAVENOUS | Status: AC
  Administered 2016-08-23: 2.1 mg via INTRAVENOUS
  Filled 2016-08-23: qty 2.1

## 2016-08-23 MED ORDER — ONDANSETRON HCL 4 MG PO TABS
8.0000 mg | ORAL_TABLET | Freq: Once | ORAL | Status: AC
  Administered 2016-08-23: 8 mg via ORAL
  Filled 2016-08-23: qty 2

## 2016-08-23 MED ORDER — SODIUM CHLORIDE 0.9 % IV SOLN
Freq: Once | INTRAVENOUS | Status: AC
  Administered 2016-08-23: 09:00:00 via INTRAVENOUS
  Filled 2016-08-23: qty 1000

## 2016-08-23 MED ORDER — HEPARIN SOD (PORK) LOCK FLUSH 100 UNIT/ML IV SOLN
500.0000 [IU] | Freq: Once | INTRAVENOUS | Status: AC | PRN
  Administered 2016-08-23: 500 [IU]
  Filled 2016-08-23: qty 5

## 2016-08-24 ENCOUNTER — Ambulatory Visit: Payer: Medicare Other

## 2016-08-24 ENCOUNTER — Inpatient Hospital Stay: Payer: Medicare Other

## 2016-08-24 ENCOUNTER — Other Ambulatory Visit: Payer: Self-pay | Admitting: Hematology and Oncology

## 2016-08-24 ENCOUNTER — Inpatient Hospital Stay: Payer: Medicare Other | Admitting: Hematology and Oncology

## 2016-08-24 VITALS — BP 123/65 | HR 70 | Resp 20

## 2016-08-24 DIAGNOSIS — C569 Malignant neoplasm of unspecified ovary: Secondary | ICD-10-CM

## 2016-08-24 DIAGNOSIS — C786 Secondary malignant neoplasm of retroperitoneum and peritoneum: Secondary | ICD-10-CM

## 2016-08-24 DIAGNOSIS — C801 Malignant (primary) neoplasm, unspecified: Secondary | ICD-10-CM

## 2016-08-24 MED ORDER — SODIUM CHLORIDE 0.9 % IV SOLN
Freq: Once | INTRAVENOUS | Status: AC
  Administered 2016-08-24: 09:00:00 via INTRAVENOUS
  Filled 2016-08-24: qty 1000

## 2016-08-24 MED ORDER — ONDANSETRON HCL 4 MG PO TABS
8.0000 mg | ORAL_TABLET | Freq: Once | ORAL | Status: AC
  Administered 2016-08-24: 8 mg via ORAL
  Filled 2016-08-24: qty 2

## 2016-08-24 MED ORDER — HEPARIN SOD (PORK) LOCK FLUSH 100 UNIT/ML IV SOLN
500.0000 [IU] | Freq: Once | INTRAVENOUS | Status: AC | PRN
  Administered 2016-08-24: 500 [IU]
  Filled 2016-08-24: qty 5

## 2016-08-24 MED ORDER — TOPOTECAN HCL CHEMO INJECTION 4 MG
1.2500 mg/m2 | Freq: Once | INTRAVENOUS | Status: AC
  Administered 2016-08-24: 2.1 mg via INTRAVENOUS
  Filled 2016-08-24: qty 2.1

## 2016-08-25 ENCOUNTER — Inpatient Hospital Stay: Payer: Medicare Other

## 2016-08-25 ENCOUNTER — Inpatient Hospital Stay: Payer: Medicare Other | Admitting: Hematology and Oncology

## 2016-08-25 VITALS — BP 108/68 | HR 80 | Temp 98.8°F | Resp 18

## 2016-08-25 DIAGNOSIS — C801 Malignant (primary) neoplasm, unspecified: Secondary | ICD-10-CM

## 2016-08-25 DIAGNOSIS — C786 Secondary malignant neoplasm of retroperitoneum and peritoneum: Secondary | ICD-10-CM

## 2016-08-25 DIAGNOSIS — C569 Malignant neoplasm of unspecified ovary: Secondary | ICD-10-CM

## 2016-08-25 MED ORDER — TOPOTECAN HCL CHEMO INJECTION 4 MG
1.2500 mg/m2 | Freq: Once | INTRAVENOUS | Status: AC
  Administered 2016-08-25: 2.1 mg via INTRAVENOUS
  Filled 2016-08-25: qty 2.1

## 2016-08-25 MED ORDER — SODIUM CHLORIDE 0.9 % IV SOLN
Freq: Once | INTRAVENOUS | Status: AC
  Administered 2016-08-25: 09:00:00 via INTRAVENOUS
  Filled 2016-08-25: qty 1000

## 2016-08-25 MED ORDER — ONDANSETRON HCL 4 MG PO TABS
8.0000 mg | ORAL_TABLET | Freq: Once | ORAL | Status: AC
  Administered 2016-08-25: 8 mg via ORAL
  Filled 2016-08-25: qty 2

## 2016-08-25 MED ORDER — HEPARIN SOD (PORK) LOCK FLUSH 100 UNIT/ML IV SOLN
500.0000 [IU] | Freq: Once | INTRAVENOUS | Status: AC | PRN
  Administered 2016-08-25: 500 [IU]
  Filled 2016-08-25: qty 5

## 2016-08-25 MED ORDER — SODIUM CHLORIDE 0.9% FLUSH
10.0000 mL | INTRAVENOUS | Status: DC | PRN
  Administered 2016-08-25: 10 mL
  Filled 2016-08-25: qty 10

## 2016-08-26 ENCOUNTER — Other Ambulatory Visit: Payer: Self-pay | Admitting: Hematology and Oncology

## 2016-08-26 ENCOUNTER — Other Ambulatory Visit: Payer: Self-pay | Admitting: *Deleted

## 2016-08-26 ENCOUNTER — Inpatient Hospital Stay: Payer: Medicare Other

## 2016-08-26 ENCOUNTER — Inpatient Hospital Stay: Payer: Medicare Other | Admitting: Hematology and Oncology

## 2016-08-26 VITALS — BP 122/68 | HR 72 | Temp 98.2°F | Resp 16

## 2016-08-26 DIAGNOSIS — C786 Secondary malignant neoplasm of retroperitoneum and peritoneum: Secondary | ICD-10-CM

## 2016-08-26 DIAGNOSIS — C801 Malignant (primary) neoplasm, unspecified: Secondary | ICD-10-CM

## 2016-08-26 DIAGNOSIS — Z7189 Other specified counseling: Secondary | ICD-10-CM

## 2016-08-26 DIAGNOSIS — C569 Malignant neoplasm of unspecified ovary: Secondary | ICD-10-CM

## 2016-08-26 DIAGNOSIS — Z5111 Encounter for antineoplastic chemotherapy: Secondary | ICD-10-CM

## 2016-08-26 DIAGNOSIS — D649 Anemia, unspecified: Secondary | ICD-10-CM

## 2016-08-26 LAB — URINALYSIS, COMPLETE (UACMP) WITH MICROSCOPIC
Bilirubin Urine: NEGATIVE
Glucose, UA: NEGATIVE mg/dL
Hgb urine dipstick: NEGATIVE
Ketones, ur: NEGATIVE mg/dL
Nitrite: NEGATIVE
Protein, ur: 30 mg/dL — AB
Specific Gravity, Urine: 1.015 (ref 1.005–1.030)
pH: 7 (ref 5.0–8.0)

## 2016-08-26 LAB — VITAMIN B12: Vitamin B-12: 894 pg/mL (ref 180–914)

## 2016-08-26 LAB — BASIC METABOLIC PANEL
ANION GAP: 5 (ref 5–15)
BUN: 15 mg/dL (ref 6–20)
CHLORIDE: 101 mmol/L (ref 101–111)
CO2: 30 mmol/L (ref 22–32)
CREATININE: 0.76 mg/dL (ref 0.44–1.00)
Calcium: 9.3 mg/dL (ref 8.9–10.3)
GFR calc Af Amer: >60 mL/min (ref >60)
GFR calc non Af Amer: >60 mL/min (ref >60)
Glucose, Bld: 115 mg/dL — ABNORMAL HIGH (ref 65–99)
POTASSIUM: 4.1 mmol/L (ref 3.5–5.1)
Sodium: 136 mmol/L (ref 135–145)

## 2016-08-26 LAB — CBC WITH DIFFERENTIAL/PLATELET
Basophils Absolute: 0.1 10*3/uL (ref 0–0.1)
Basophils Relative: 2 %
Eosinophils Absolute: 0 10*3/uL (ref 0–0.7)
Eosinophils Relative: 1 %
HCT: 25.9 % — ABNORMAL LOW (ref 35.0–47.0)
Hemoglobin: 9 g/dL — ABNORMAL LOW (ref 12.0–16.0)
Lymphocytes Relative: 21 %
Lymphs Abs: 0.6 10*3/uL — ABNORMAL LOW (ref 1.0–3.6)
MCH: 31.3 pg (ref 26.0–34.0)
MCHC: 34.9 g/dL (ref 32.0–36.0)
MCV: 89.6 fL (ref 80.0–100.0)
Monocytes Absolute: 0.1 10*3/uL — ABNORMAL LOW (ref 0.2–0.9)
Monocytes Relative: 3 %
Neutro Abs: 1.9 10*3/uL (ref 1.4–6.5)
Neutrophils Relative %: 73 %
Platelets: 226 10*3/uL (ref 150–440)
RBC: 2.88 MIL/uL — ABNORMAL LOW (ref 3.80–5.20)
RDW: 14.1 % (ref 11.5–14.5)
WBC: 2.7 10*3/uL — ABNORMAL LOW (ref 3.6–11.0)

## 2016-08-26 LAB — MAGNESIUM: Magnesium: 1.4 mg/dL — ABNORMAL LOW (ref 1.7–2.4)

## 2016-08-26 MED ORDER — PEGFILGRASTIM 6 MG/0.6ML ~~LOC~~ PSKT
6.0000 mg | PREFILLED_SYRINGE | Freq: Once | SUBCUTANEOUS | Status: AC
  Administered 2016-08-26: 6 mg via SUBCUTANEOUS
  Filled 2016-08-26: qty 0.6

## 2016-08-26 MED ORDER — HEPARIN SOD (PORK) LOCK FLUSH 100 UNIT/ML IV SOLN
INTRAVENOUS | Status: AC
  Filled 2016-08-26: qty 5

## 2016-08-26 MED ORDER — ONDANSETRON HCL 4 MG PO TABS
8.0000 mg | ORAL_TABLET | Freq: Once | ORAL | Status: AC
  Administered 2016-08-26: 8 mg via ORAL
  Filled 2016-08-26: qty 2

## 2016-08-26 MED ORDER — CIPROFLOXACIN HCL 250 MG PO TABS: 250.0000 mg | ORAL_TABLET | Freq: Two times a day (BID) | ORAL | 0 refills | Status: DC

## 2016-08-26 MED ORDER — SODIUM CHLORIDE 0.9 % IV SOLN
Freq: Once | INTRAVENOUS | Status: AC
  Administered 2016-08-26: 09:00:00 via INTRAVENOUS
  Filled 2016-08-26: qty 1000

## 2016-08-26 MED ORDER — TOPOTECAN HCL CHEMO INJECTION 4 MG
1.2500 mg/m2 | Freq: Once | INTRAVENOUS | Status: AC
  Administered 2016-08-26: 2.1 mg via INTRAVENOUS
  Filled 2016-08-26: qty 2.1

## 2016-08-26 MED ORDER — HEPARIN SOD (PORK) LOCK FLUSH 100 UNIT/ML IV SOLN
500.0000 [IU] | Freq: Once | INTRAVENOUS | Status: AC
  Administered 2016-08-26: 500 [IU] via INTRAVENOUS
  Filled 2016-08-26: qty 5

## 2016-08-26 MED ORDER — SODIUM CHLORIDE 0.9% FLUSH
10.0000 mL | INTRAVENOUS | Status: DC | PRN
  Administered 2016-08-26: 10 mL via INTRAVENOUS
  Filled 2016-08-26: qty 10

## 2016-08-26 MED ORDER — MAGNESIUM SULFATE 4 GM/100ML IV SOLN
4.0000 g | Freq: Once | INTRAVENOUS | Status: AC
  Administered 2016-08-26: 4 g via INTRAVENOUS
  Filled 2016-08-26: qty 100

## 2016-08-26 NOTE — Progress Notes (Signed)
Patient to receive 4 gm mag today per Dr. Mike Gip. LJ

## 2016-08-28 LAB — URINE CULTURE: Culture: >=100000 — AB

## 2016-08-29 ENCOUNTER — Inpatient Hospital Stay: Payer: Medicare Other | Admitting: Hematology and Oncology

## 2016-08-29 ENCOUNTER — Inpatient Hospital Stay: Payer: Medicare Other

## 2016-09-01 ENCOUNTER — Encounter: Payer: Self-pay | Admitting: Hematology and Oncology

## 2016-09-01 ENCOUNTER — Other Ambulatory Visit: Payer: Self-pay | Admitting: Hematology and Oncology

## 2016-09-01 ENCOUNTER — Inpatient Hospital Stay: Payer: Medicare Other

## 2016-09-01 ENCOUNTER — Other Ambulatory Visit: Payer: Self-pay | Admitting: *Deleted

## 2016-09-01 ENCOUNTER — Inpatient Hospital Stay: Payer: Medicare Other | Attending: Hematology and Oncology

## 2016-09-01 ENCOUNTER — Inpatient Hospital Stay (HOSPITAL_BASED_OUTPATIENT_CLINIC_OR_DEPARTMENT_OTHER): Payer: Medicare Other | Admitting: Hematology and Oncology

## 2016-09-01 VITALS — BP 116/70 | HR 80 | Temp 97.4°F | Resp 18 | Wt 138.0 lb

## 2016-09-01 DIAGNOSIS — E785 Hyperlipidemia, unspecified: Secondary | ICD-10-CM | POA: Diagnosis not present

## 2016-09-01 DIAGNOSIS — C7801 Secondary malignant neoplasm of right lung: Secondary | ICD-10-CM | POA: Diagnosis not present

## 2016-09-01 DIAGNOSIS — E876 Hypokalemia: Secondary | ICD-10-CM | POA: Diagnosis not present

## 2016-09-01 DIAGNOSIS — Z794 Long term (current) use of insulin: Secondary | ICD-10-CM | POA: Insufficient documentation

## 2016-09-01 DIAGNOSIS — E108 Type 1 diabetes mellitus with unspecified complications: Secondary | ICD-10-CM | POA: Diagnosis not present

## 2016-09-01 DIAGNOSIS — I82C11 Acute embolism and thrombosis of right internal jugular vein: Secondary | ICD-10-CM

## 2016-09-01 DIAGNOSIS — I5032 Chronic diastolic (congestive) heart failure: Secondary | ICD-10-CM | POA: Insufficient documentation

## 2016-09-01 DIAGNOSIS — K449 Diaphragmatic hernia without obstruction or gangrene: Secondary | ICD-10-CM | POA: Insufficient documentation

## 2016-09-01 DIAGNOSIS — Z79899 Other long term (current) drug therapy: Secondary | ICD-10-CM | POA: Diagnosis not present

## 2016-09-01 DIAGNOSIS — D6481 Anemia due to antineoplastic chemotherapy: Secondary | ICD-10-CM | POA: Diagnosis not present

## 2016-09-01 DIAGNOSIS — D649 Anemia, unspecified: Secondary | ICD-10-CM

## 2016-09-01 DIAGNOSIS — Z87891 Personal history of nicotine dependence: Secondary | ICD-10-CM

## 2016-09-01 DIAGNOSIS — Z86718 Personal history of other venous thrombosis and embolism: Secondary | ICD-10-CM | POA: Insufficient documentation

## 2016-09-01 DIAGNOSIS — M069 Rheumatoid arthritis, unspecified: Secondary | ICD-10-CM | POA: Diagnosis not present

## 2016-09-01 DIAGNOSIS — Z8701 Personal history of pneumonia (recurrent): Secondary | ICD-10-CM

## 2016-09-01 DIAGNOSIS — I2510 Atherosclerotic heart disease of native coronary artery without angina pectoris: Secondary | ICD-10-CM | POA: Insufficient documentation

## 2016-09-01 DIAGNOSIS — Z7189 Other specified counseling: Secondary | ICD-10-CM

## 2016-09-01 DIAGNOSIS — K219 Gastro-esophageal reflux disease without esophagitis: Secondary | ICD-10-CM | POA: Insufficient documentation

## 2016-09-01 DIAGNOSIS — I998 Other disorder of circulatory system: Secondary | ICD-10-CM

## 2016-09-01 DIAGNOSIS — Z5111 Encounter for antineoplastic chemotherapy: Secondary | ICD-10-CM | POA: Diagnosis not present

## 2016-09-01 DIAGNOSIS — D72819 Decreased white blood cell count, unspecified: Secondary | ICD-10-CM | POA: Insufficient documentation

## 2016-09-01 DIAGNOSIS — C569 Malignant neoplasm of unspecified ovary: Secondary | ICD-10-CM | POA: Insufficient documentation

## 2016-09-01 DIAGNOSIS — D709 Neutropenia, unspecified: Secondary | ICD-10-CM | POA: Insufficient documentation

## 2016-09-01 DIAGNOSIS — G629 Polyneuropathy, unspecified: Secondary | ICD-10-CM | POA: Diagnosis not present

## 2016-09-01 DIAGNOSIS — N3000 Acute cystitis without hematuria: Secondary | ICD-10-CM

## 2016-09-01 DIAGNOSIS — I252 Old myocardial infarction: Secondary | ICD-10-CM | POA: Insufficient documentation

## 2016-09-01 DIAGNOSIS — C801 Malignant (primary) neoplasm, unspecified: Secondary | ICD-10-CM

## 2016-09-01 DIAGNOSIS — B9689 Other specified bacterial agents as the cause of diseases classified elsewhere: Secondary | ICD-10-CM | POA: Insufficient documentation

## 2016-09-01 DIAGNOSIS — Z66 Do not resuscitate: Secondary | ICD-10-CM | POA: Insufficient documentation

## 2016-09-01 DIAGNOSIS — T451X5A Adverse effect of antineoplastic and immunosuppressive drugs, initial encounter: Secondary | ICD-10-CM

## 2016-09-01 DIAGNOSIS — C786 Secondary malignant neoplasm of retroperitoneum and peritoneum: Secondary | ICD-10-CM

## 2016-09-01 DIAGNOSIS — N390 Urinary tract infection, site not specified: Secondary | ICD-10-CM | POA: Insufficient documentation

## 2016-09-01 DIAGNOSIS — C787 Secondary malignant neoplasm of liver and intrahepatic bile duct: Secondary | ICD-10-CM

## 2016-09-01 DIAGNOSIS — D696 Thrombocytopenia, unspecified: Secondary | ICD-10-CM

## 2016-09-01 DIAGNOSIS — Z9641 Presence of insulin pump (external) (internal): Secondary | ICD-10-CM

## 2016-09-01 DIAGNOSIS — Z7689 Persons encountering health services in other specified circumstances: Secondary | ICD-10-CM | POA: Diagnosis not present

## 2016-09-01 DIAGNOSIS — Z8673 Personal history of transient ischemic attack (TIA), and cerebral infarction without residual deficits: Secondary | ICD-10-CM | POA: Insufficient documentation

## 2016-09-01 DIAGNOSIS — I2582 Chronic total occlusion of coronary artery: Secondary | ICD-10-CM

## 2016-09-01 DIAGNOSIS — Z87442 Personal history of urinary calculi: Secondary | ICD-10-CM | POA: Insufficient documentation

## 2016-09-01 LAB — CBC WITH DIFFERENTIAL/PLATELET
Basophils Absolute: 0 10*3/uL (ref 0–0.1)
Basophils Relative: 1 %
Eosinophils Absolute: 0 10*3/uL (ref 0–0.7)
Eosinophils Relative: 0 %
HCT: 21.1 % — ABNORMAL LOW (ref 35.0–47.0)
Hemoglobin: 7.4 g/dL — ABNORMAL LOW (ref 12.0–16.0)
Lymphocytes Relative: 41 %
Lymphs Abs: 0.4 10*3/uL — ABNORMAL LOW (ref 1.0–3.6)
MCH: 30.8 pg (ref 26.0–34.0)
MCHC: 35 g/dL (ref 32.0–36.0)
MCV: 88 fL (ref 80.0–100.0)
Monocytes Absolute: 0.1 10*3/uL — ABNORMAL LOW (ref 0.2–0.9)
Monocytes Relative: 14 %
Neutro Abs: 0.5 10*3/uL — ABNORMAL LOW (ref 1.4–6.5)
Neutrophils Relative %: 45 %
Platelets: 56 10*3/uL — ABNORMAL LOW (ref 150–440)
RBC: 2.4 MIL/uL — ABNORMAL LOW (ref 3.80–5.20)
RDW: 13.8 % (ref 11.5–14.5)
WBC: 1 10*3/uL — CL (ref 3.6–11.0)

## 2016-09-01 LAB — MAGNESIUM: Magnesium: 1.4 mg/dL — ABNORMAL LOW (ref 1.7–2.4)

## 2016-09-01 LAB — BASIC METABOLIC PANEL
Anion gap: 7 (ref 5–15)
BUN: 19 mg/dL (ref 6–20)
CO2: 28 mmol/L (ref 22–32)
Calcium: 9.1 mg/dL (ref 8.9–10.3)
Chloride: 101 mmol/L (ref 101–111)
Creatinine, Ser: 0.8 mg/dL (ref 0.44–1.00)
GFR calc Af Amer: >60 mL/min (ref >60)
GFR calc non Af Amer: >60 mL/min (ref >60)
Glucose, Bld: 156 mg/dL — ABNORMAL HIGH (ref 65–99)
Potassium: 3.8 mmol/L (ref 3.5–5.1)
Sodium: 136 mmol/L (ref 135–145)

## 2016-09-01 LAB — ABO/RH: ABO/RH(D): A POS

## 2016-09-01 LAB — PREPARE RBC (CROSSMATCH)

## 2016-09-01 MED ORDER — SODIUM CHLORIDE 0.9% FLUSH
10.0000 mL | INTRAVENOUS | Status: DC | PRN
  Administered 2016-09-01: 10 mL via INTRAVENOUS
  Filled 2016-09-01: qty 10

## 2016-09-01 MED ORDER — HEPARIN SOD (PORK) LOCK FLUSH 100 UNIT/ML IV SOLN
250.0000 [IU] | Freq: Once | INTRAVENOUS | Status: AC | PRN
Start: 2016-09-01 — End: 2016-09-01
  Filled 2016-09-01: qty 5

## 2016-09-01 MED ORDER — HEPARIN SOD (PORK) LOCK FLUSH 100 UNIT/ML IV SOLN
500.0000 [IU] | Freq: Once | INTRAVENOUS | Status: AC
  Administered 2016-09-01: 500 [IU] via INTRAVENOUS

## 2016-09-01 MED ORDER — MAGNESIUM SULFATE 4 GM/100ML IV SOLN
4.0000 g | Freq: Once | INTRAVENOUS | Status: AC
  Administered 2016-09-01: 4 g via INTRAVENOUS
  Filled 2016-09-01: qty 100

## 2016-09-01 NOTE — Progress Notes (Signed)
[facility] day:  09/01/2016   Chief Complaint: [name] is a 71 y.o. female with stage IV ovarian cancer who is seen for nadir assessment on day 11 of cycle #1 topotecan.  HPI:  The patient was last seen in the medical oncology clinic on 08/22/2016.  At that time, she began cycle #1 topotecan.  Topotecan completed on 08/26/2016.  She received OnPro Neulasta.  Magnesium was was 1.4.  She received 4 gm magnesium sulfate.  She underwent an anemia work-up.  Iron studies included a ferritin of 37, iron saturation 11% and TIBC 281. Folate was 54.6. B12 was 894.   She states that she tolerated this chemotherapy better than prior chemotherapy. With on ProNeulasta , she "hurt all over".  She forgot to take her Claritin the day before.   Symptomatically, she feels okay. She denies any problems.  She is on ciprofloxacin for a UTI. Urine cultures from 08/26/2016 revealed greater than 100,000 colonies of Serratia marcescens.  She denies any urgency, frequency or burning. All symptoms have resolved.   Past Medical History:  Diagnosis Date  . CAD (coronary artery disease)    a. 09/2012 Cath: LM nl, LAD 95p, 56m LCX 963mOM2 50, RCA 100.  . Marland Kitchenholelithiasis   . Chronic diastolic CHF (congestive heart failure) ([place]  . Collagen vascular disease (H[place]  . Esophageal stricture   . Exertional shortness of breath   . GERD (gastroesophageal reflux disease)   . H/O hiatal hernia   . Herniated disc   . History of pancytopenia   . Hyperlipidemia   . Hypertension   . Hypokalemia   . Leukopenia 2012   s/p bone marrow biopsy, Dr. PaMa Hillock. NSTEMI (non-ST elevated myocardial infarction) ([place]/2014   "mild" (10/18/2012)  . Ovarian cancer (HCTacna2016   chemo  . Pneumonia 2013; 08/2012   "one lung; double" (10/18/2012)  . Rheumatoid arthritis(714.0)   . Type I diabetes mellitus (HCDecatur   "dx'd in 1957" (10/18/2012)    Past Surgical History:  Procedure  Laterality Date  . ABDOMINAL HYSTERECTOMY  06/15/2015   Dx L/S, EXLAP TAH BSO omentectomy RSRx colostomy diaphragm resection stripping  . CARDIAC CATHETERIZATION  10/18/2012   "first one was today" (10/18/2012)  . CATARACT EXTRACTION W/ INTRAOCULAR LENS IMPLANT Right 2010  . CHOLECYSTECTOMY  06/15/2015   combined case with ovarian cancer debulking  . COLON SURGERY    . CORONARY ARTERY BYPASS GRAFT N/A 10/19/2012   Procedure: CORONARY ARTERY BYPASS GRAFTING (CABG);  Surgeon: StMelrose NakayamaMD;  [facility] Service: Open Heart Surgery;  Laterality: N/A;  . ESOPHAGEAL DILATION     "3 or 4 times" (10/19/2011)  . ESOPHAGOGASTRODUODENOSCOPY  2012   Dr. IfMinna Merritts. OSTOMY    . OVARY SURGERY     removal  . PERIPHERAL VASCULAR CATHETERIZATION N/A 03/02/2015   Procedure: PoGlori Luisath Insertion;  Surgeon: JaAlgernon HuxleyMD;  [facility];  Service: Cardiovascular;  Laterality: N/A;  . TUBAL LIGATION  1970    Family History  Problem Relation Age of Onset  . Diabetes Mother   . Arthritis Mother   . Diabetes Father   . Arthritis Father   . Bone cancer Sister     Social History:  reports that she quit smoking about 22 years ago. Her smoking use included Cigarettes. She has a 30.00 pack-year smoking history. She has never used smokeless tobacco.  She reports that she does not drink alcohol or use drugs.  She is accompanied by her husband today.  Allergies:  Allergies  Allergen Reactions  . Codeine Nausea And Vomiting  . Latex Rash    Current Medications: Current Outpatient Prescriptions  Medication Sig Dispense Refill  . Calcium-Vitamin D 600-200 MG-UNIT tablet Take 1 tablet by mouth 2 (two) times daily.     . cholecalciferol (VITAMIN D) 1000 units tablet Take 1,000 Units by mouth daily.    . ciprofloxacin (CIPRO) 250 MG tablet Take 1 tablet (250 mg total) by mouth 2 (two) times daily. 14 tablet 0  . enoxaparin (LOVENOX) 60 MG/0.6ML injection INJECT 0.6 ML (60 MG  TOTAL) INTO THE SKIN EVERY 12 HOURS 36 mL 2  . folic acid (FOLVITE) 1 MG tablet TAKE 1 TABLET BY MOUTH ONCE A DAY 90 tablet 3  . furosemide (LASIX) 80 MG tablet Take 0.5 tablets by mouth 2 (two) times daily.    . insulin lispro (HUMALOG) 100 UNIT/ML injection Inject 0.06 mLs (6 Units total) into the skin daily. (Patient taking differently: Inject 0-85 Units into the skin daily. Via insulin pump) 30 mL 3  . lidocaine-prilocaine (EMLA) cream APPLY 1 APPLICATION TOPICALLY AS NEEDED 1 HOUR PRIOR TO TREATMENT. COVER WITH PRESS N SEAL UNTIL TREATMENT TIME AS DIRECTED 30 g 1  . loratadine (CLARITIN) 10 MG tablet Take 1 tablet by mouth daily as needed.    . magnesium oxide (MAG-OX) 400 (241.3 Mg) MG tablet TAKE 1 TABLET BY MOUTH ONCE DAILY 30 tablet 0  . metoprolol tartrate (LOPRESSOR) 25 MG tablet Take 1 tablet (25 mg total) by mouth 2 (two) times daily. 180 tablet 3  . ondansetron (ZOFRAN) 8 MG tablet TAKE 1 TABLET BY MOUTH EVERY 8 HOURS AS NEEDED FOR NAUSEA OR VOMITING 30 tablet 1  . ONE TOUCH ULTRA TEST test strip     . pantoprazole (PROTONIX) 40 MG tablet Take 1 tablet (40 mg total) by mouth 2 (two) times daily. 180 tablet 1  . potassium chloride SA (K-DUR,KLOR-CON) 20 MEQ tablet Take 1 tablet (20 mEq total) by mouth daily. 30 tablet 0  . predniSONE (DELTASONE) 5 MG tablet Take 5 mg by mouth daily with breakfast.    . ranitidine (ZANTAC) 150 MG tablet Take 1 tablet (150 mg total) by mouth 2 (two) times daily. 180 tablet 1  . vitamin C (ASCORBIC ACID) 500 MG tablet Take 500 mg by mouth daily.    Marland Kitchen zinc gluconate 50 MG tablet Take 50 mg by mouth daily.    Marland Kitchen losartan (COZAAR) 25 MG tablet Take 1 tablet (25 mg total) by mouth daily. (Patient not taking: Reported on 08/22/2016) 90 tablet 2   Current Facility-Administered Medications  Medication Dose Route Frequency Provider Last Rate Last Dose  . Tbo-Filgrastim (GRANIX) injection 300 mcg  300 mcg Subcutaneous Once Lequita Asal, MD        Facility-Administered Medications Ordered in Other Visits  Medication Dose Route Frequency Provider Last Rate Last Dose  . heparin lock flush 100 unit/mL  500 Units Intravenous Once Lequita Asal, MD      . heparin lock flush 100 unit/mL  250 Units Intracatheter Once PRN Lequita Asal, MD      . magnesium sulfate IVPB 4 g 100 mL  4 g Intravenous Once Lequita Asal, MD 50 mL/hr at 09/01/16 0955 4 g at 09/01/16 0955  . sodium chloride 0.9 % injection 10 mL  10 mL Intracatheter PRN  Lequita Asal, MD      . sodium chloride 0.9 % injection 10 mL  10 mL Intravenous PRN Lequita Asal, MD   10 mL at 04/13/15 0852  . sodium chloride flush (NS) 0.9 % injection 10 mL  10 mL Intravenous PRN Lequita Asal, MD   10 mL at 09/01/16 0840    Review of Systems:  GENERAL:  Feels "ok".  No fever, chills or sweats.  Weight down 1 pound. PERFORMANCE STATUS (ECOG):  1 HEENT:  No visual changes, sore throat, mouth sores or tenderness. Lungs:  No shortness of breath or cough.  No hemoptysis. Cardiac:  No chest pain, palpitations, orthopnea, or PND.  Took BP pill this AM. GI:  No abdominal pain.  No nausea, vomiting, diarrhea, melena or hematochezia. GU:  UTI symptoms resolved (last day of ciprofloxacin today).  No urgency, frequency, dysuria, or hematuria. Musculoskeletal:  Severe rheumatoid arthritis (off MTX and on prednisone).  Achy s/p Neulasta.  Osteoporosis.  No back pain. No muscle tenderness. Extremities:  No pain or swelling. Skin:  No increased bruising or bleeding.  No rashes or skin changes. Neuro:  Neuropathy (stable).  No headache, numbness or weakness, balance or coordination issues. Endocrine:  Diabetes on an insulin pump.  Occasional hot flashes.  Psych:  No mood changes, depression or anxiety. Pain: No pain. Review of systems:  All other systems reviewed and found to be negative.  Physical Exam: Blood pressure 116/70, pulse 80, temperature 97.4 F (36.3 C),  temperature source Tympanic, resp. rate 18, weight 138 lb (62.6 kg). GENERAL:  Well developed, well nourished woman sitting comfortably in the exam room in no acute distress. MENTAL STATUS:  Alert and oriented to person, place and time. HEAD:  Curly gray hair.  Normocephalic, atraumatic, face symmetric, no Cushingoid features. EYES:  Gold rimmed glasses.  Blue eyes.  No conjunctivitis or scleral icterus.  ENT: Oropharynx clear without lesion. Tongue normal. Mucous membranes moist.  RESPIRATORY: Clear to auscultation without rales, wheezes or rhonchi. CARDIOVASCULAR: Regular rate and rhythm without murmur, rub or gallop. ABDOMEN: Soft, non-tender with active bowel sounds and no hepatosplenomegaly. No palpable nodularity or masses.  Insulin pump.  Ostomy bag. SKIN: Bruising on abdomen s/p Lovenox injections. No rashes or ulcers. EXTREMITIES:  No edema, skin discoloration or tenderness. No palpable cords. LYMPH NODES: No palpable cervical, supraclavicular, axillary or inguinal adenopathy  NEUROLOGICAL: Appropriate. PSYCH: Appropriate.   Radiology studies: 02/13/2015:  Abdomen and pelvic CT revealed bilateral mass-like adnexal regions (right adnexal mass 5.6 x 5.0 cm and the left adnexa mass 10.3 x 6.0 x 9.9 cm).  There was a large amount of soft tissue throughout the peritoneal cavity involving the omentum and other peritoneal surfaces.  There was a small volume ascites. There was a peripheral 3.3 x 1.9 cm low-attenuation lesion overlying the right lobe of the liver, likely representing a serosal implant. There was 1.4 x 2.2 cm ill-defined peripheral lesion within the inferior aspect of segment 6 adjacent to the ampulla in the duodenum.  04/28/2015:  Abdominal and pelvic CT revealed decreasing bilateral ovarian masses.   The left adnexal mass measured 4.0 x 6.1 cm (previously 5.0 x 8.5 cm). The right ovary measured 3.8 x 4.9 cm (previously 4.7 x 5.6 cm).  There was improved peritoneal  carcinomatosis.  There was a small amount of ascites.  The hepatic dome lesion was stable. The previously seen right hepatic lobe lesion was not well-visualized.  There was a small left pleural  effusion and trace right pleural effusion.  The nodular lesion at the ampulla of Vater, extending into the duodenum was stable.  There was no biliary ductal dilatation. 08/01/2015:  Rght upper extremity ultrasound revealed a near occlusive thrombus within the central portion of the right internal jugular vein and central portion of the right subclavian vein. 09/01/2015:  Chest, abdomen, and pelvic CT revealed continued decrease in perihepatic fluid collection contiguous with the right pleural space with percutaneous drain.  09/11/2015:  Chest, abdomen, and pelvic CT revealed a residual versus recurrent fluid collection posterior to the right hepatic lobe (2.6 x 1.1 x 4.0 cm).   10/13/2015:  Chest, abdomen, and pelvic CT revealed resolution of the empyema. 02/15/2016:  Chest, abdomen, and pelvic CT revealed multiple soft tissue nodules throughout the pelvis, small bowel mesenteric and peritoneum and, concerning for peritoneal metastatic disease.  There was soft tissue irregularity within the left upper quadrant.  There was mild right hydronephrosis (etiology unclear).  There was interval resolution of previously described right pleural based fluid and gas collection. There was a pleural based nodule within the right lower hemi-thorax concerning for pleural based metastasis.  There were small bilateral pleural effusions.  There was pulmonary nodularity, predominately within the left upper lobe (metastatic or infectious/inflammatory etiology).  There was slightly increased mediastinal adenopathy (infectious/inflammatory or metastatic).  There was a low attenuation lesion within the left hepatic lobe (complicated fluid within the fissure or metastatic disease). 03/08/2016:  Abdomen and pelvic CT revealed mild progression  of peritoneal disease in the abdomen.  There was interval increase in loculated fluid around the lateral segment left liver, stomach, and spleen.  There was persistent soft tissue lesion at the level of the ampulla with mild intra and extrahepatic biliary duct dilatation.  There was persistent intrahepatic and capsular metastatic disease involving the liver. 05/26/2016:  Head MRI revealed multiple small areas of acute infarct involving the occipital parietal lobe bilaterally and the left lateral cerebellum,  consistent with posterior circulation emboli.  06/10/2016:  Adomen and pelvic CT revealed progression of peritoneal carcinomatosis predominantly in the perihepatic space, bilateral lower quadrants and pelvis.  There was worsening bilateral obstructive uropathy due to malignant involvement of the pelvic ureters.  There was stable small pleural/subpleural nodules at the right lung base.  There was stable small left pleural effusion.  There was decreased small volume perihepatic ascites.  08/09/2016:  Abdomen and pelvic CT revealed interval increase and loculated fluid collections within the peritoneal space consistent with progression of peritoneal ovarian carcinoma metastasis.  There was one large 7 cm collection in the RIGHT lower quadrant which had increased significantly in size and may represent of point of small bowel obstruction.  The proximal stomach and duodenum were decompressed. The mid small bowel was mildly dilated. There was concern for obstruction given the large intraperitoneal fluid collection.  There was increase in subcapsular hepatic metastatic fluid collections.  There was enlargement of rounded ampullary lesion in the second portion duodenum concerning for metastatic lesion.  There was mild biliary duct dilatation (stable).  There was nodular pleural metastasis in the RIGHT lower lobe pleural space.   Infusion on 09/01/2016  Component Date Value Ref Range Status  . Sodium 09/01/2016  136  135 - 145 mmol/L Final  . Potassium 09/01/2016 3.8  3.5 - 5.1 mmol/L Final  . Chloride 09/01/2016 101  101 - 111 mmol/L Final  . CO2 09/01/2016 28  22 - 32 mmol/L Final  . Glucose, Bld 09/01/2016 156* 65 -  99 mg/dL Final  . BUN 09/01/2016 19  6 - 20 mg/dL Final  . Creatinine, Ser 09/01/2016 0.80  0.44 - 1.00 mg/dL Final  . Calcium 09/01/2016 9.1  8.9 - 10.3 mg/dL Final  . GFR calc non Af Amer 09/01/2016 >60  >60 mL/min Final  . GFR calc Af Amer 09/01/2016 >60  >60 mL/min Final   Comment: (NOTE) The eGFR has been calculated using the CKD EPI equation. This calculation has not been validated in all clinical situations. eGFR's persistently <60 mL/min signify possible Chronic Kidney Disease.   . Anion gap 09/01/2016 7  5 - 15 Final  . Magnesium 09/01/2016 1.4* 1.7 - 2.4 mg/dL Final  . WBC 09/01/2016 1.0* 3.6 - 11.0 K/uL Final   Comment: RESULT REPEATED AND VERIFIED CANCER CENTER CRITICAL VALUE PROTOCOL   . RBC 09/01/2016 2.40* 3.80 - 5.20 MIL/uL Final  . Hemoglobin 09/01/2016 7.4* 12.0 - 16.0 g/dL Final  . HCT 09/01/2016 21.1* 35.0 - 47.0 % Final  . MCV 09/01/2016 88.0  80.0 - 100.0 fL Final  . MCH 09/01/2016 30.8  26.0 - 34.0 pg Final  . MCHC 09/01/2016 35.0  32.0 - 36.0 g/dL Final  . RDW 09/01/2016 13.8  11.5 - 14.5 % Final  . Platelets 09/01/2016 56* 150 - 440 K/uL Final  . Neutrophils Relative % 09/01/2016 45  % Final  . Neutro Abs 09/01/2016 0.5* 1.4 - 6.5 K/uL Final  . Lymphocytes Relative 09/01/2016 41  % Final  . Lymphs Abs 09/01/2016 0.4* 1.0 - 3.6 K/uL Final  . Monocytes Relative 09/01/2016 14  % Final  . Monocytes Absolute 09/01/2016 0.1* 0.2 - 0.9 K/uL Final  . Eosinophils Relative 09/01/2016 0  % Final  . Eosinophils Absolute 09/01/2016 0.0  0 - 0.7 K/uL Final  . Basophils Relative 09/01/2016 1  % Final  . Basophils Absolute 09/01/2016 0.0  0 - 0.1 K/uL Final    Assessment:  KENNEDEY DIGILIO is a 71 y.o. female with progressive stage IV ovarian cancer.  She  presented with abdominal discomfort and bloating.  Omental biopsy on 02/23/2015 revealed metastatic high grade serous carcinoma, consistent with gynecologic origin.   She was initially diagnosed with clinical stage IIIC (T3cN1Mx).  CA125 was 707 on 02/17/2015.  She received 4 cycles of neoadjuvant carboplatin and Taxol (03/05/2015 - 05/22/2015).  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  She had loose stools on oral magnesium.  She was initially on Neurontin then switched Lyrica with cycle #3.  Cycle #4 was notable for neutropenia (ANC 300) requiring GCSF x 3 days.    She received tamoxifen from 02/25/2016 - 03/14/2016.  She received 4 cycles of carboplatin and gemcitabine (03/21/2016 - 05/24/2016) with GCSF/Neulasta support.  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  She received 2 cycles of Doxil (06/21/2016 - 07/19/2016) with Neulasta support.   CA125 was 802.9 on 03/30/2015, 567.9 on 04/13/2015, 168.8 on 05/15/2015, 85.2 on 07/17/2015, 68.6 on 07/28/2015, 34.5 on 09/17/2015, 20.7 on 11/06/2015, 49 on 01/22/2016, 106.1 on 02/22/2016, 138.8 on 03/07/2016, 220.8 on 03/21/2016, 152.8 on 04/11/2016, 125.6 on 04/18/2016, 76.8 on 05/03/2016, 60.2 on 05/24/2016, 44 on 07/19/2016, and 65.8 on 08/17/2016.  She underwent exploratory laparotomy, lysis of adhesions, total abdominal hysterectomy with bilateral salpingo-oophorectomy, infracolic omentectomy, optimal tumor debulking(< 1 cm), recto-sigmoid resection with creation of end colostomy, cholecystectomy, mobilization of splenic flexure and liver with diaphragmatic stripping on 06/15/2015. The right diaphragm was cleared of tumor. During dissection, the diaphragm was  entered and closed with sutures.   She had a recurrent right sided pleural effusion.  She underwent thoracentesis of 650 cc on post-operative day 3.  She was admitted to [facility] on 07/01/2015 and 07/07/2015 for recurrent shortness of breath.  She underwent 2 additional  thoracenteses (1.1 L on 07/02/2015 and 850 cc on 07/08/2015).  Cytology was negative x 2.  Bilateral lower extremity duplex on 07/03/2015 was negative.  Echo revealed an EF of 55-60% on 07/08/2015 and 60-65% on 05/27/2016.    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 She was on Lovenox 60 mg twice a day.  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  She was admitted to [facility] from 07/29/2015 - 08/10/2015 with a right-sided empyema and liver abscess. She underwent CT-guided placement of a liver abscess drain on 07/30/2015.  Liver abscess culture grew out group B strep and Enterobacter which was sensitive to Zosyn. She was transitioned to ertapenem Colbert Ewing) prior to discharge.  She was readmitted to [facility] on 09/17/2015.  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  Head MRI on 05/26/2016 revealed multiple small areas of acute infarct involving the occipital parietal lobe bilaterally and the left lateral cerebellum,  consistent with posterior circulation emboli.  Work-up included a negative carotid ultrasound and echocardiogram.  She was admitted to [facility] from 08/10/2016 - 08/14/2016 with a small bowel obstruction.  She was managed conservatively.  She has severe rheumatoid arthritis.  Methotrexate and Enbrel were initially on hold.  She has a normocytic anemia.  Work-up on 02/17/2015 and 05/24/2016 revealed a normal ferritin, B12, folate, TSH.  She denies any melena or hematochezia.    She has anemia due to chronic disease. She received 1 unit PRBCs during her admission at [facility]. She denies any melena or hematochezia. She has diabetes and is on an insulin pump.  She has had persistent neutropenia felt secondary to her rheumatoid arthritis.  Folate and MMA  were normal.  TSH was 6.13 (high) with a free T4 of 1.17 (0.61-1.12).  She began methotrexate (10 mg a week) and prednisone (5 mg a day) for severe rheumatoid arthritis on 12/17/2015.  She remains on prednisone alone (5 mg a day).  Bone marrow aspirate and biopsy on 06/09/2010 revealed a hypercellular marrow (70%) with no evidence of dysplasia or malignancy.  Flow cytometry was negative.  Cytogenetics were normal (46,XX).  FISH studies were negative for MDS.  Bone marrow aspirate and biopsy on 12/03/2015 revealed a normocellular to mildy hypercellular marrow for age (40%) with left shifted myelopoiesis, non specific dyserythropoiesis and mild megakaryocytic atypia with no increase in blasts.  There were multiple small nonspecific lymphoid aggregates (favor reactive). There was no increase in reticulin.  There was decreased myeloid cells (37%) with left shifted maturation and 1% atypical myelod blasts.  There was relatively increased monocytic cells (11%), relatively increased lymphoid cells (36%), and relatively increased eosinophils (6%).  Cytogenetics were normal (5, XX).  SNP microarray was normal.  Abdomen and pelvic CT scan on 08/09/2016 revealed interval increase and loculated fluid collections within the peritoneal space consistent with progression of peritoneal ovarian carcinoma metastasis.  There was one large 7 cm collection in the RIGHT lower quadrant which had increased significantly in size and may represent of point of small  bowel obstruction.  There was increase in subcapsular hepatic metastatic fluid collections.  There was enlargement of rounded ampullary lesion in the second portion duodenum concerning for metastatic lesion.  There was mild biliary duct dilatation (stable).  There was nodular pleural metastasis in the RIGHT lower lobe pleural space.  She is currently day 11 of cycle #1 topotecan with Neulasta support (08/22/2016).  She was diagnosed with a Serratia marcescens UTI on  08/26/2016.  She is on ciprofloxacin.  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  She receives IV magnesium weekly.  She has chemotherapy induced anemia.  Labs on 08/22/2016 revealed a normal B12 and folate.  Ferritin was 37, iron saturation 11% and TIBC 281.  Code status is DNR/DNI.  Symptomatically, she feels good.  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  She has progressive anemia.  Plan: 1.  Labs today:  CBC with diff, BMP, Mg. 2.  Review anemia work-up.  Discuss chemotherapy induced anemia.  Discuss Procrit. 3.  Preauth Procrit. 4.  Magnesium 4 gm IV today. 6.  RTC on 09/08/2016 for labs (CBC with diff, Mg) and +/- Procrit and +/- IV Mg. 7.  RTC on 09/12/2016 for MD assessment, labs (CBC with diff, CMP, Mg, CA125), IV Mg, and cycle #2 topotecan.  Addendum:  CBC today revealed a hematocrit of 21.1, hemoglobin 7.4, platelets 56,000, white count 1000 with an ANC of 500. She was contacted regarding neutropenic precautions.  She was scheduled for transfusion of packed red blood cells today. She will return tomorrow for repeat CBC to assess her platelet count as she is on Lovenox.  She will hold this evening's dose.  CBC on 09/02/2016 revealed a hematocrit 27.2, hemoglobin 9.5, platelets 38,000, white count 3100 with an ANC of 2200. She will continue to hold her Lovenox over the weekend. She was encouraged to contact the clinic if she has any increased bruising or bleeding. She will have a repeat CBC on 09/05/2016. A urinalysis and culture will also be checked to document clearance of her urinary tract infection.   Lequita Asal, MD  09/01/2016, 10:38 AM

## 2016-09-01 NOTE — Progress Notes (Signed)
Nutrition Follow-up:  Nutrition follow-up completed with patient and husband in infusion this am.  Patient receiving palliative chemotherapy.  Patient with stage IV ovarian cancer and followed by Dr. Mike Gip.  Patient reports that she has a good appetite, "too good'.  Reports that she has been drinking at least 1 glucerna per day sometimes 2.  Has been eating small frequent meals, using glucerna as a snack and yogurt as another snack.  Also enjoys pudding and ice cream as snacks.  Husband has been cooking meals.  Reports had pork cooked crock pot with green beans last night. Patient reports that she has been eating a little bit more meat in the last few weeks.    Reports regular bowel movements  Medications: reviewed  Labs: glucose 156, Mag 1.4  Anthropometrics:   Weight today 138 lb slightly lower than 139 lb 2 oz on 4/23 visit.     NUTRITION DIAGNOSIS: Food and nutrition related knowledge deficit improved   MALNUTRITION DIAGNOSIS: none at this time   INTERVENTION:   Questions answered regarding low fiber diet.   Encouraged continued small frequent meals and use of oral nutrition supplements to provide additional calories and protein.      MONITORING, EVALUATION, GOAL: Patient will consume adequate calories and protein to maintain weight    NEXT VISIT: as needed   Cain Fitzhenry B. Zenia Resides, [place], [place] Registered Dietitian [phone] (pager)

## 2016-09-01 NOTE — Progress Notes (Signed)
Patient offers no complaints today. 

## 2016-09-02 ENCOUNTER — Inpatient Hospital Stay: Payer: Medicare Other

## 2016-09-02 ENCOUNTER — Ambulatory Visit: Payer: Medicare Other | Admitting: Surgery

## 2016-09-02 ENCOUNTER — Other Ambulatory Visit: Payer: Self-pay | Admitting: *Deleted

## 2016-09-02 ENCOUNTER — Telehealth: Payer: Self-pay | Admitting: *Deleted

## 2016-09-02 DIAGNOSIS — C801 Malignant (primary) neoplasm, unspecified: Principal | ICD-10-CM

## 2016-09-02 DIAGNOSIS — I82C11 Acute embolism and thrombosis of right internal jugular vein: Secondary | ICD-10-CM

## 2016-09-02 DIAGNOSIS — T451X5A Adverse effect of antineoplastic and immunosuppressive drugs, initial encounter: Secondary | ICD-10-CM

## 2016-09-02 DIAGNOSIS — C569 Malignant neoplasm of unspecified ovary: Secondary | ICD-10-CM | POA: Diagnosis not present

## 2016-09-02 DIAGNOSIS — D6481 Anemia due to antineoplastic chemotherapy: Secondary | ICD-10-CM

## 2016-09-02 DIAGNOSIS — C786 Secondary malignant neoplasm of retroperitoneum and peritoneum: Secondary | ICD-10-CM

## 2016-09-02 LAB — CBC WITH DIFFERENTIAL/PLATELET
Basophils Absolute: 0 10*3/uL (ref 0–0.1)
Basophils Relative: 0 %
Eosinophils Absolute: 0 10*3/uL (ref 0–0.7)
Eosinophils Relative: 0 %
HCT: 27.2 % — ABNORMAL LOW (ref 35.0–47.0)
Hemoglobin: 9.5 g/dL — ABNORMAL LOW (ref 12.0–16.0)
Lymphocytes Relative: 22 %
Lymphs Abs: 0.7 10*3/uL — ABNORMAL LOW (ref 1.0–3.6)
MCH: 30.3 pg (ref 26.0–34.0)
MCHC: 34.8 g/dL (ref 32.0–36.0)
MCV: 87.2 fL (ref 80.0–100.0)
Monocytes Absolute: 0.2 10*3/uL (ref 0.2–0.9)
Monocytes Relative: 7 %
Neutro Abs: 2.2 10*3/uL (ref 1.4–6.5)
Neutrophils Relative %: 71 %
Platelets: 38 10*3/uL — ABNORMAL LOW (ref 150–440)
RBC: 3.12 MIL/uL — ABNORMAL LOW (ref 3.80–5.20)
RDW: 14.2 % (ref 11.5–14.5)
WBC: 3.1 10*3/uL — ABNORMAL LOW (ref 3.6–11.0)

## 2016-09-02 MED ORDER — HEPARIN SOD (PORK) LOCK FLUSH 100 UNIT/ML IV SOLN
500.0000 [IU] | Freq: Every day | INTRAVENOUS | Status: AC | PRN
  Administered 2016-09-02: 500 [IU]

## 2016-09-02 MED ORDER — SODIUM CHLORIDE 0.9% FLUSH
10.0000 mL | INTRAVENOUS | Status: AC | PRN
  Administered 2016-09-02: 10 mL
  Filled 2016-09-02: qty 10

## 2016-09-02 MED ORDER — DIPHENHYDRAMINE HCL 25 MG PO CAPS
25.0000 mg | ORAL_CAPSULE | Freq: Once | ORAL | Status: AC
  Administered 2016-09-02: 25 mg via ORAL
  Filled 2016-09-02: qty 1

## 2016-09-02 MED ORDER — SODIUM CHLORIDE 0.9 % IV SOLN
250.0000 mL | Freq: Once | INTRAVENOUS | Status: AC
  Administered 2016-09-02: 250 mL via INTRAVENOUS
  Filled 2016-09-02: qty 250

## 2016-09-02 MED ORDER — HEPARIN SOD (PORK) LOCK FLUSH 100 UNIT/ML IV SOLN
250.0000 [IU] | INTRAVENOUS | Status: DC | PRN
  Filled 2016-09-02: qty 5

## 2016-09-02 MED ORDER — ACETAMINOPHEN 325 MG PO TABS
650.0000 mg | ORAL_TABLET | Freq: Once | ORAL | Status: AC
  Administered 2016-09-02: 650 mg via ORAL
  Filled 2016-09-02: qty 2

## 2016-09-02 MED ORDER — SODIUM CHLORIDE 0.9% FLUSH
3.0000 mL | INTRAVENOUS | Status: DC | PRN
  Filled 2016-09-02: qty 3

## 2016-09-02 NOTE — Telephone Encounter (Signed)
Called patient to inform her that her platelets are dropping.  Per MD do not take any aspirin, lovenox or ibuprofen.  Patient is to come to lab on Monday for CBC and blood tube.  Scheduler will call with appointment time.

## 2016-09-02 NOTE — Telephone Encounter (Signed)
Called patient and informed her that we forgot to get her CBC and could she come back in for the CBC draw, voiced understanding. Patient will come by 4pm today for draw.

## 2016-09-02 NOTE — Telephone Encounter (Signed)
-----   Message from Lequita Asal, MD sent at 09/02/2016  4:04 PM EDT ----- Regarding: Please call patient  Platelet count dropping.  No Lovenox, aspirin, or ibuprofen.  CBC and hold tube on Monday.  Call if any increased bruising or bleeding.  M  ----- Message ----- From: Interface, Lab In [place] Sent: 09/02/2016   3:42 PM To: Lequita Asal, MD

## 2016-09-03 LAB — TYPE AND SCREEN
ABO/RH(D): A POS
Antibody Screen: NEGATIVE
Unit division: 0

## 2016-09-03 LAB — BPAM RBC
Blood Product Expiration Date: 201805102359
ISSUE DATE / TIME: 201805040940
Unit Type and Rh: 6200

## 2016-09-04 DIAGNOSIS — D696 Thrombocytopenia, unspecified: Secondary | ICD-10-CM | POA: Insufficient documentation

## 2016-09-05 ENCOUNTER — Other Ambulatory Visit: Payer: Self-pay | Admitting: Hematology and Oncology

## 2016-09-05 ENCOUNTER — Telehealth: Payer: Self-pay | Admitting: *Deleted

## 2016-09-05 ENCOUNTER — Inpatient Hospital Stay: Payer: Medicare Other

## 2016-09-05 ENCOUNTER — Other Ambulatory Visit: Payer: Self-pay | Admitting: *Deleted

## 2016-09-05 DIAGNOSIS — C801 Malignant (primary) neoplasm, unspecified: Principal | ICD-10-CM

## 2016-09-05 DIAGNOSIS — C786 Secondary malignant neoplasm of retroperitoneum and peritoneum: Secondary | ICD-10-CM

## 2016-09-05 DIAGNOSIS — N3000 Acute cystitis without hematuria: Secondary | ICD-10-CM

## 2016-09-05 DIAGNOSIS — C569 Malignant neoplasm of unspecified ovary: Secondary | ICD-10-CM | POA: Diagnosis not present

## 2016-09-05 LAB — URINALYSIS, COMPLETE (UACMP) WITH MICROSCOPIC
Bilirubin Urine: NEGATIVE
Glucose, UA: NEGATIVE mg/dL
Hgb urine dipstick: NEGATIVE
Ketones, ur: NEGATIVE mg/dL
Leukocytes, UA: NEGATIVE
Nitrite: NEGATIVE
Protein, ur: NEGATIVE mg/dL
Specific Gravity, Urine: 1.01 (ref 1.005–1.030)
pH: 5 (ref 5.0–8.0)

## 2016-09-05 LAB — CBC WITH DIFFERENTIAL/PLATELET
Basophils Absolute: 0 10*3/uL (ref 0–0.1)
Basophils Relative: 1 %
Eosinophils Absolute: 0 10*3/uL (ref 0–0.7)
Eosinophils Relative: 1 %
HCT: 29.5 % — ABNORMAL LOW (ref 35.0–47.0)
Hemoglobin: 10.2 g/dL — ABNORMAL LOW (ref 12.0–16.0)
Lymphocytes Relative: 27 %
Lymphs Abs: 0.8 10*3/uL — ABNORMAL LOW (ref 1.0–3.6)
MCH: 30.6 pg (ref 26.0–34.0)
MCHC: 34.5 g/dL (ref 32.0–36.0)
MCV: 88.6 fL (ref 80.0–100.0)
Monocytes Absolute: 0.4 10*3/uL (ref 0.2–0.9)
Monocytes Relative: 12 %
Neutro Abs: 1.9 10*3/uL (ref 1.4–6.5)
Neutrophils Relative %: 60 %
Platelets: 48 10*3/uL — ABNORMAL LOW (ref 150–440)
RBC: 3.33 MIL/uL — ABNORMAL LOW (ref 3.80–5.20)
RDW: 14.1 % (ref 11.5–14.5)
WBC: 3.1 10*3/uL — ABNORMAL LOW (ref 3.6–11.0)

## 2016-09-05 LAB — MAGNESIUM: Magnesium: 1.5 mg/dL — ABNORMAL LOW (ref 1.7–2.4)

## 2016-09-05 NOTE — Telephone Encounter (Signed)
Patient informed that Dr Mike Gip prefers for her to hold lovenox 1 more day and we will recheck her labs tomorrow and make a decision voiced understanding.

## 2016-09-05 NOTE — Telephone Encounter (Signed)
Called patient and discussed need for IV mag, patient voiced understanding and ability to come to clinic tomorrow for mag infusion.

## 2016-09-05 NOTE — Telephone Encounter (Signed)
Platelets are 48 today, discussed with Dr. Grayland Ormond that her platelets were 48 and that she had lovenox 60 bid on hold, ordered to restart at once daily, patient and husband instructed voiced understanding.

## 2016-09-06 ENCOUNTER — Other Ambulatory Visit: Payer: Self-pay | Admitting: *Deleted

## 2016-09-06 ENCOUNTER — Other Ambulatory Visit: Payer: Self-pay | Admitting: Hematology and Oncology

## 2016-09-06 ENCOUNTER — Telehealth: Payer: Self-pay | Admitting: *Deleted

## 2016-09-06 ENCOUNTER — Inpatient Hospital Stay: Payer: Medicare Other

## 2016-09-06 DIAGNOSIS — I82C11 Acute embolism and thrombosis of right internal jugular vein: Secondary | ICD-10-CM

## 2016-09-06 DIAGNOSIS — C801 Malignant (primary) neoplasm, unspecified: Secondary | ICD-10-CM

## 2016-09-06 DIAGNOSIS — C569 Malignant neoplasm of unspecified ovary: Secondary | ICD-10-CM | POA: Diagnosis not present

## 2016-09-06 DIAGNOSIS — T451X5A Adverse effect of antineoplastic and immunosuppressive drugs, initial encounter: Secondary | ICD-10-CM

## 2016-09-06 DIAGNOSIS — C786 Secondary malignant neoplasm of retroperitoneum and peritoneum: Secondary | ICD-10-CM

## 2016-09-06 DIAGNOSIS — D6481 Anemia due to antineoplastic chemotherapy: Secondary | ICD-10-CM

## 2016-09-06 LAB — CBC WITH DIFFERENTIAL/PLATELET
Basophils Absolute: 0 10*3/uL (ref 0–0.1)
Basophils Relative: 0 %
Eosinophils Absolute: 0 10*3/uL (ref 0–0.7)
Eosinophils Relative: 0 %
HCT: 27.1 % — ABNORMAL LOW (ref 35.0–47.0)
Hemoglobin: 9.5 g/dL — ABNORMAL LOW (ref 12.0–16.0)
Lymphocytes Relative: 11 %
Lymphs Abs: 0.6 10*3/uL — ABNORMAL LOW (ref 1.0–3.6)
MCH: 30.8 pg (ref 26.0–34.0)
MCHC: 35 g/dL (ref 32.0–36.0)
MCV: 88.2 fL (ref 80.0–100.0)
Monocytes Absolute: 0.5 10*3/uL (ref 0.2–0.9)
Monocytes Relative: 9 %
Neutro Abs: 4.6 10*3/uL (ref 1.4–6.5)
Neutrophils Relative %: 80 %
Platelets: 81 10*3/uL — ABNORMAL LOW (ref 150–440)
RBC: 3.07 MIL/uL — ABNORMAL LOW (ref 3.80–5.20)
RDW: 14.1 % (ref 11.5–14.5)
WBC: 5.8 10*3/uL (ref 3.6–11.0)

## 2016-09-06 LAB — URINE CULTURE

## 2016-09-06 MED ORDER — HEPARIN SOD (PORK) LOCK FLUSH 100 UNIT/ML IV SOLN
INTRAVENOUS | Status: AC
Start: 2016-09-06 — End: 2016-09-06
  Filled 2016-09-06: qty 5

## 2016-09-06 MED ORDER — SODIUM CHLORIDE 0.9 % IV SOLN
INTRAVENOUS | Status: DC
Start: 2016-09-06 — End: 2016-09-06
  Administered 2016-09-06: 14:00:00 via INTRAVENOUS
  Filled 2016-09-06: qty 1000

## 2016-09-06 MED ORDER — MAGNESIUM SULFATE 4 GM/100ML IV SOLN
4.0000 g | Freq: Once | INTRAVENOUS | Status: AC
  Administered 2016-09-06: 4 g via INTRAVENOUS
  Filled 2016-09-06: qty 100

## 2016-09-06 MED ORDER — HEPARIN SOD (PORK) LOCK FLUSH 100 UNIT/ML IV SOLN
500.0000 [IU] | Freq: Once | INTRAVENOUS | Status: AC
  Administered 2016-09-06: 500 [IU] via INTRAVENOUS

## 2016-09-06 MED ORDER — SODIUM CHLORIDE 0.9% FLUSH
10.0000 mL | INTRAVENOUS | Status: DC | PRN
  Administered 2016-09-06: 10 mL via INTRAVENOUS
  Filled 2016-09-06: qty 10

## 2016-09-06 NOTE — Telephone Encounter (Signed)
Patient and husband instructed that platelets were up to 81 and that Dr. Janese Banks had ordered to restart lovenox as ordered bid, voiced understanding.

## 2016-09-08 ENCOUNTER — Inpatient Hospital Stay: Payer: Medicare Other

## 2016-09-08 DIAGNOSIS — C801 Malignant (primary) neoplasm, unspecified: Secondary | ICD-10-CM

## 2016-09-08 DIAGNOSIS — C786 Secondary malignant neoplasm of retroperitoneum and peritoneum: Secondary | ICD-10-CM

## 2016-09-08 DIAGNOSIS — C569 Malignant neoplasm of unspecified ovary: Secondary | ICD-10-CM | POA: Diagnosis not present

## 2016-09-08 LAB — CBC WITH DIFFERENTIAL/PLATELET
Basophils Absolute: 0 10*3/uL (ref 0–0.1)
Basophils Relative: 0 %
Eosinophils Absolute: 0 10*3/uL (ref 0–0.7)
Eosinophils Relative: 0 %
HCT: 25.7 % — ABNORMAL LOW (ref 35.0–47.0)
Hemoglobin: 9 g/dL — ABNORMAL LOW (ref 12.0–16.0)
Lymphocytes Relative: 8 %
Lymphs Abs: 0.6 10*3/uL — ABNORMAL LOW (ref 1.0–3.6)
MCH: 30.8 pg (ref 26.0–34.0)
MCHC: 34.9 g/dL (ref 32.0–36.0)
MCV: 88.4 fL (ref 80.0–100.0)
Monocytes Absolute: 0.5 10*3/uL (ref 0.2–0.9)
Monocytes Relative: 7 %
Neutro Abs: 5.9 10*3/uL (ref 1.4–6.5)
Neutrophils Relative %: 85 %
Platelets: 170 10*3/uL (ref 150–440)
RBC: 2.9 MIL/uL — ABNORMAL LOW (ref 3.80–5.20)
RDW: 14.1 % (ref 11.5–14.5)
WBC: 6.9 10*3/uL (ref 3.6–11.0)

## 2016-09-08 LAB — MAGNESIUM: Magnesium: 1.5 mg/dL — ABNORMAL LOW (ref 1.7–2.4)

## 2016-09-08 MED ORDER — EPOETIN ALFA 10000 UNIT/ML IJ SOLN
10000.0000 [IU] | Freq: Once | INTRAMUSCULAR | Status: AC
  Administered 2016-09-08: 10000 [IU] via SUBCUTANEOUS
  Filled 2016-09-08: qty 2

## 2016-09-08 MED ORDER — MAGNESIUM SULFATE 4 GM/100ML IV SOLN
4.0000 g | Freq: Once | INTRAVENOUS | Status: AC
  Administered 2016-09-08: 4 g via INTRAVENOUS
  Filled 2016-09-08: qty 100

## 2016-09-08 MED ORDER — HEPARIN SOD (PORK) LOCK FLUSH 100 UNIT/ML IV SOLN
250.0000 [IU] | Freq: Once | INTRAVENOUS | Status: AC | PRN
  Administered 2016-09-08: 250 [IU]
  Filled 2016-09-08: qty 5

## 2016-09-09 ENCOUNTER — Other Ambulatory Visit: Payer: Self-pay | Admitting: *Deleted

## 2016-09-09 ENCOUNTER — Other Ambulatory Visit: Payer: Self-pay

## 2016-09-09 DIAGNOSIS — C569 Malignant neoplasm of unspecified ovary: Secondary | ICD-10-CM

## 2016-09-09 NOTE — Telephone Encounter (Signed)
Last OV 07/26/16 last filled by Dr.Walker  09/02/15 90 [id number]

## 2016-09-10 MED ORDER — FOLIC ACID 1 MG PO TABS: 1.0000 mg | ORAL_TABLET | Freq: Every day | ORAL | 3 refills | Status: DC

## 2016-09-12 ENCOUNTER — Inpatient Hospital Stay: Payer: Medicare Other

## 2016-09-12 ENCOUNTER — Telehealth: Payer: Self-pay | Admitting: *Deleted

## 2016-09-12 ENCOUNTER — Other Ambulatory Visit: Payer: Self-pay | Admitting: *Deleted

## 2016-09-12 ENCOUNTER — Other Ambulatory Visit: Payer: Self-pay | Admitting: Hematology and Oncology

## 2016-09-12 ENCOUNTER — Inpatient Hospital Stay (HOSPITAL_BASED_OUTPATIENT_CLINIC_OR_DEPARTMENT_OTHER): Payer: Medicare Other | Admitting: Hematology and Oncology

## 2016-09-12 VITALS — BP 120/60 | HR 77 | Temp 97.4°F | Ht 63.0 in | Wt 134.4 lb

## 2016-09-12 DIAGNOSIS — N390 Urinary tract infection, site not specified: Secondary | ICD-10-CM | POA: Diagnosis not present

## 2016-09-12 DIAGNOSIS — E108 Type 1 diabetes mellitus with unspecified complications: Secondary | ICD-10-CM

## 2016-09-12 DIAGNOSIS — Z86718 Personal history of other venous thrombosis and embolism: Secondary | ICD-10-CM

## 2016-09-12 DIAGNOSIS — D709 Neutropenia, unspecified: Secondary | ICD-10-CM

## 2016-09-12 DIAGNOSIS — C569 Malignant neoplasm of unspecified ovary: Secondary | ICD-10-CM

## 2016-09-12 DIAGNOSIS — Z7689 Persons encountering health services in other specified circumstances: Secondary | ICD-10-CM

## 2016-09-12 DIAGNOSIS — K219 Gastro-esophageal reflux disease without esophagitis: Secondary | ICD-10-CM

## 2016-09-12 DIAGNOSIS — C786 Secondary malignant neoplasm of retroperitoneum and peritoneum: Secondary | ICD-10-CM

## 2016-09-12 DIAGNOSIS — I2510 Atherosclerotic heart disease of native coronary artery without angina pectoris: Secondary | ICD-10-CM | POA: Diagnosis not present

## 2016-09-12 DIAGNOSIS — Z66 Do not resuscitate: Secondary | ICD-10-CM

## 2016-09-12 DIAGNOSIS — I82C11 Acute embolism and thrombosis of right internal jugular vein: Secondary | ICD-10-CM

## 2016-09-12 DIAGNOSIS — R300 Dysuria: Secondary | ICD-10-CM

## 2016-09-12 DIAGNOSIS — I5032 Chronic diastolic (congestive) heart failure: Secondary | ICD-10-CM | POA: Diagnosis not present

## 2016-09-12 DIAGNOSIS — I252 Old myocardial infarction: Secondary | ICD-10-CM

## 2016-09-12 DIAGNOSIS — C787 Secondary malignant neoplasm of liver and intrahepatic bile duct: Secondary | ICD-10-CM | POA: Diagnosis not present

## 2016-09-12 DIAGNOSIS — Z87442 Personal history of urinary calculi: Secondary | ICD-10-CM

## 2016-09-12 DIAGNOSIS — Z79899 Other long term (current) drug therapy: Secondary | ICD-10-CM | POA: Diagnosis not present

## 2016-09-12 DIAGNOSIS — K449 Diaphragmatic hernia without obstruction or gangrene: Secondary | ICD-10-CM

## 2016-09-12 DIAGNOSIS — M069 Rheumatoid arthritis, unspecified: Secondary | ICD-10-CM

## 2016-09-12 DIAGNOSIS — D6481 Anemia due to antineoplastic chemotherapy: Secondary | ICD-10-CM

## 2016-09-12 DIAGNOSIS — B9689 Other specified bacterial agents as the cause of diseases classified elsewhere: Secondary | ICD-10-CM | POA: Diagnosis not present

## 2016-09-12 DIAGNOSIS — Z9641 Presence of insulin pump (external) (internal): Secondary | ICD-10-CM

## 2016-09-12 DIAGNOSIS — D72819 Decreased white blood cell count, unspecified: Secondary | ICD-10-CM

## 2016-09-12 DIAGNOSIS — C7801 Secondary malignant neoplasm of right lung: Secondary | ICD-10-CM

## 2016-09-12 DIAGNOSIS — G629 Polyneuropathy, unspecified: Secondary | ICD-10-CM

## 2016-09-12 DIAGNOSIS — Z87891 Personal history of nicotine dependence: Secondary | ICD-10-CM

## 2016-09-12 DIAGNOSIS — C801 Malignant (primary) neoplasm, unspecified: Secondary | ICD-10-CM

## 2016-09-12 DIAGNOSIS — E785 Hyperlipidemia, unspecified: Secondary | ICD-10-CM

## 2016-09-12 DIAGNOSIS — T451X5A Adverse effect of antineoplastic and immunosuppressive drugs, initial encounter: Secondary | ICD-10-CM

## 2016-09-12 DIAGNOSIS — I998 Other disorder of circulatory system: Secondary | ICD-10-CM

## 2016-09-12 DIAGNOSIS — Z794 Long term (current) use of insulin: Secondary | ICD-10-CM

## 2016-09-12 DIAGNOSIS — Z8673 Personal history of transient ischemic attack (TIA), and cerebral infarction without residual deficits: Secondary | ICD-10-CM

## 2016-09-12 DIAGNOSIS — Z8701 Personal history of pneumonia (recurrent): Secondary | ICD-10-CM

## 2016-09-12 DIAGNOSIS — E876 Hypokalemia: Secondary | ICD-10-CM

## 2016-09-12 LAB — URINALYSIS, COMPLETE (UACMP) WITH MICROSCOPIC
Bilirubin Urine: NEGATIVE
Glucose, UA: 150 mg/dL — AB
Hgb urine dipstick: NEGATIVE
Ketones, ur: NEGATIVE mg/dL
Nitrite: NEGATIVE
Protein, ur: 30 mg/dL — AB
Specific Gravity, Urine: 1.017 (ref 1.005–1.030)
pH: 5 (ref 5.0–8.0)

## 2016-09-12 LAB — COMPREHENSIVE METABOLIC PANEL
ALT: 11 U/L — ABNORMAL LOW (ref 14–54)
AST: 15 U/L (ref 15–41)
Albumin: 3.3 g/dL — ABNORMAL LOW (ref 3.5–5.0)
Alkaline Phosphatase: 99 U/L (ref 38–126)
Anion gap: 7 (ref 5–15)
BUN: 17 mg/dL (ref 6–20)
CO2: 27 mmol/L (ref 22–32)
Calcium: 9.2 mg/dL (ref 8.9–10.3)
Chloride: 99 mmol/L — ABNORMAL LOW (ref 101–111)
Creatinine, Ser: 0.87 mg/dL (ref 0.44–1.00)
GFR calc Af Amer: >60 mL/min (ref >60)
GFR calc non Af Amer: >60 mL/min (ref >60)
Glucose, Bld: 234 mg/dL — ABNORMAL HIGH (ref 65–99)
Potassium: 4.2 mmol/L (ref 3.5–5.1)
Sodium: 133 mmol/L — ABNORMAL LOW (ref 135–145)
Total Bilirubin: 0.5 mg/dL (ref 0.3–1.2)
Total Protein: 6.8 g/dL (ref 6.5–8.1)

## 2016-09-12 LAB — CBC WITH DIFFERENTIAL/PLATELET
Basophils Absolute: 0 10*3/uL (ref 0–0.1)
Basophils Relative: 1 %
Eosinophils Absolute: 0 10*3/uL (ref 0–0.7)
Eosinophils Relative: 1 %
HCT: 26.5 % — ABNORMAL LOW (ref 35.0–47.0)
Hemoglobin: 9.2 g/dL — ABNORMAL LOW (ref 12.0–16.0)
Lymphocytes Relative: 10 %
Lymphs Abs: 0.6 10*3/uL — ABNORMAL LOW (ref 1.0–3.6)
MCH: 30.8 pg (ref 26.0–34.0)
MCHC: 34.6 g/dL (ref 32.0–36.0)
MCV: 89.2 fL (ref 80.0–100.0)
Monocytes Absolute: 0.9 10*3/uL (ref 0.2–0.9)
Monocytes Relative: 15 %
Neutro Abs: 4.7 10*3/uL (ref 1.4–6.5)
Neutrophils Relative %: 73 %
Platelets: 364 10*3/uL (ref 150–440)
RBC: 2.97 MIL/uL — ABNORMAL LOW (ref 3.80–5.20)
RDW: 14.4 % (ref 11.5–14.5)
WBC: 6.3 10*3/uL (ref 3.6–11.0)

## 2016-09-12 LAB — MAGNESIUM: Magnesium: 1.4 mg/dL — ABNORMAL LOW (ref 1.7–2.4)

## 2016-09-12 MED ORDER — SODIUM CHLORIDE 0.9% FLUSH
10.0000 mL | Freq: Once | INTRAVENOUS | Status: AC
  Administered 2016-09-12: 10 mL via INTRAVENOUS
  Filled 2016-09-12: qty 10

## 2016-09-12 MED ORDER — SODIUM CHLORIDE 0.9 % IV SOLN
Freq: Once | INTRAVENOUS | Status: AC
  Administered 2016-09-12: 11:00:00 via INTRAVENOUS
  Filled 2016-09-12: qty 1000

## 2016-09-12 MED ORDER — HEPARIN SOD (PORK) LOCK FLUSH 100 UNIT/ML IV SOLN
500.0000 [IU] | Freq: Once | INTRAVENOUS | Status: DC | PRN
  Filled 2016-09-12: qty 5

## 2016-09-12 MED ORDER — MAGNESIUM SULFATE 4 GM/100ML IV SOLN
4.0000 g | Freq: Once | INTRAVENOUS | Status: AC
  Administered 2016-09-12: 4 g via INTRAVENOUS
  Filled 2016-09-12: qty 100

## 2016-09-12 MED ORDER — CIPROFLOXACIN HCL 500 MG PO TABS: 500.0000 mg | ORAL_TABLET | Freq: Two times a day (BID) | ORAL | 0 refills | Status: DC

## 2016-09-12 MED ORDER — ONDANSETRON HCL 4 MG PO TABS: 8.0000 mg | ORAL_TABLET | Freq: Once | ORAL | Status: DC

## 2016-09-12 MED ORDER — SODIUM CHLORIDE 0.9% FLUSH
10.0000 mL | INTRAVENOUS | Status: DC | PRN
  Filled 2016-09-12: qty 10

## 2016-09-12 MED ORDER — TOPOTECAN HCL CHEMO INJECTION 4 MG
1.2500 mg/m2 | Freq: Once | INTRAVENOUS | Status: DC
  Filled 2016-09-12: qty 2.1

## 2016-09-12 MED ORDER — HEPARIN SOD (PORK) LOCK FLUSH 100 UNIT/ML IV SOLN
500.0000 [IU] | Freq: Once | INTRAVENOUS | Status: AC
  Administered 2016-09-12: 500 [IU] via INTRAVENOUS

## 2016-09-12 NOTE — Telephone Encounter (Signed)
Patient has a UTI per her UA.  Cipro has been called to her pharmacy.  The cultures on the urine will not be back for a couple of days.  Will contact patient with results at that time. Patient verbalized understanding.

## 2016-09-12 NOTE — Progress Notes (Signed)
Patient here for pre treatment check. She states she thinks she has a UTI. Appetite has been good. She has not been taking her blood pressure medications.

## 2016-09-12 NOTE — Progress Notes (Signed)
[facility] day:  09/12/2016   Chief Complaint: [name] is a 71 y.o. female with stage IV ovarian cancer who is seen for assessment prior to day 1 of cycle #2 topotecan.  HPI:  The patient was last seen in the medical oncology clinic on 09/01/2016.  At that time, she was day 11 of cycle #1 topotecan.  She felt good.  She had chronic hypomagnesemia (1.4) secondary to carboplatin.  She received 4 gm of magnesium.  Hemoglobin was 7.4.  She received 1 unit of PRBCs.  ANC nadir was 500.  Platelet count nadir was 38,000 on 09/02/2016.  Lovenox was held while her platelet count was < 50,000.  She received Procrit on 09/08/2016.  CBC on 09/08/2016 revealed a hematocrit of 25.7, hemoglobin 9.0, platelets 170,000, WBC 6900 with an ANC of 5900.  Symptomatically, she is feeling good today. She denies any issues with her stomach. She does think she has a urinary tract infection.  She has a burning sensation with decrease her urine output for the past 2 days. She has no fever.  She is not taking any blood pressure medications.  Past Medical History:  Diagnosis Date  . CAD (coronary artery disease)    a. 09/2012 Cath: LM nl, LAD 95p, 32m LCX 959mOM2 50, RCA 100.  . Marland Kitchenholelithiasis   . Chronic diastolic CHF (congestive heart failure) ([place]  . Collagen vascular disease ([place]  . Esophageal stricture   . Exertional shortness of breath   . GERD (gastroesophageal reflux disease)   . H/O hiatal hernia   . Herniated disc   . History of pancytopenia   . Hyperlipidemia   . Hypertension   . Hypokalemia   . Leukopenia 2012   s/p bone marrow biopsy, Dr. PaMa Hillock. NSTEMI (non-ST elevated myocardial infarction) (HCL[place]5/2014   "mild" (10/18/2012)  . Ovarian cancer ([place]CT[place]2016   chemo  . Pneumonia 2013; 08/2012   "one lung; double" (10/18/2012)  . Rheumatoid arthritis(714.0)   . Type I diabetes mellitus ([place]CSau[place]   "dx'd in 1957" (10/18/2012)    Past Surgical  History:  Procedure Laterality Date  . ABDOMINAL HYSTERECTOMY  06/15/2015   Dx L/S, EXLAP TAH BSO omentectomy RSRx colostomy diaphragm resection stripping  . CARDIAC CATHETERIZATION  10/18/2012   "first one was today" (10/18/2012)  . CATARACT EXTRACTION W/ INTRAOCULAR LENS IMPLANT Right 2010  . CHOLECYSTECTOMY  06/15/2015   combined case with ovarian cancer debulking  . COLON SURGERY    . CORONARY ARTERY BYPASS GRAFT N/A 10/19/2012   Procedure: CORONARY ARTERY BYPASS GRAFTING (CABG);  Surgeon: StMelrose NakayamaMD;  [place] Service: Open Heart Surgery;  Laterality: N/A;  . ESOPHAGEAL DILATION     "3 or 4 times" (10/19/2011)  . ESOPHAGOGASTRODUODENOSCOPY  2012   Dr. IfMinna Merritts. OSTOMY    . OVARY SURGERY     removal  . PERIPHERAL VASCULAR CATHETERIZATION N/A 03/02/2015   Procedure: PoGlori Luisath Insertion;  Surgeon: JaAlgernon HuxleyMD;  [facility];  Service: Cardiovascular;  Laterality: N/A;  . TUBAL LIGATION  1970    Family History  Problem Relation Age of Onset  . Diabetes Mother   . Arthritis Mother   . Diabetes Father   . Arthritis Father   . Bone cancer Sister     Social History:  reports that she quit smoking about 22 years ago. Her smoking use included  Cigarettes. She has a 30.00 pack-year smoking history. She has never used smokeless tobacco. She reports that she does not drink alcohol or use drugs.  She is accompanied by her husband today.  Allergies:  Allergies  Allergen Reactions  . Codeine Nausea And Vomiting  . Latex Rash    Current Medications: Current Outpatient Prescriptions  Medication Sig Dispense Refill  . Calcium-Vitamin D 600-200 MG-UNIT tablet Take 1 tablet by mouth 2 (two) times daily.     . cholecalciferol (VITAMIN D) 1000 units tablet Take 1,000 Units by mouth daily.    Marland Kitchen enoxaparin (LOVENOX) 60 MG/0.6ML injection INJECT 0.6 ML (60 MG TOTAL) INTO THE SKIN EVERY 12 HOURS 36 mL 2  . folic acid (FOLVITE) 1 MG tablet Take 1  tablet (1 mg total) by mouth daily. 90 tablet 3  . insulin lispro (HUMALOG) 100 UNIT/ML injection Inject 0.06 mLs (6 Units total) into the skin daily. (Patient taking differently: Inject 0-85 Units into the skin daily. Via insulin pump) 30 mL 3  . lidocaine-prilocaine (EMLA) cream APPLY 1 APPLICATION TOPICALLY AS NEEDED 1 HOUR PRIOR TO TREATMENT. COVER WITH PRESS N SEAL UNTIL TREATMENT TIME AS DIRECTED 30 g 1  . loratadine (CLARITIN) 10 MG tablet Take 1 tablet by mouth daily as needed.    . magnesium oxide (MAG-OX) 400 (241.3 Mg) MG tablet TAKE 1 TABLET BY MOUTH ONCE DAILY 30 tablet 0  . magnesium oxide (MAG-OX) 400 (241.3 Mg) MG tablet TAKE 1 TABLET BY MOUTH ONCE DAILY 30 tablet 1  . metoprolol tartrate (LOPRESSOR) 25 MG tablet Take 1 tablet (25 mg total) by mouth 2 (two) times daily. 180 tablet 3  . ondansetron (ZOFRAN) 8 MG tablet TAKE 1 TABLET BY MOUTH EVERY 8 HOURS AS NEEDED FOR NAUSEA OR VOMITING 30 tablet 1  . ONE TOUCH ULTRA TEST test strip     . pantoprazole (PROTONIX) 40 MG tablet Take 1 tablet (40 mg total) by mouth 2 (two) times daily. 180 tablet 1  . potassium chloride SA (K-DUR,KLOR-CON) 20 MEQ tablet Take 1 tablet (20 mEq total) by mouth daily. 30 tablet 0  . predniSONE (DELTASONE) 5 MG tablet Take 5 mg by mouth daily with breakfast.    . ranitidine (ZANTAC) 150 MG tablet Take 1 tablet (150 mg total) by mouth 2 (two) times daily. 180 tablet 1  . vitamin C (ASCORBIC ACID) 500 MG tablet Take 500 mg by mouth daily.    Marland Kitchen zinc gluconate 50 MG tablet Take 50 mg by mouth daily.    . furosemide (LASIX) 80 MG tablet Take 0.5 tablets by mouth 2 (two) times daily.    Marland Kitchen losartan (COZAAR) 25 MG tablet Take 1 tablet (25 mg total) by mouth daily. (Patient not taking: Reported on 09/12/2016) 90 tablet 2   Current Facility-Administered Medications  Medication Dose Route Frequency Provider Last Rate Last Dose  . Tbo-Filgrastim (GRANIX) injection 300 mcg  300 mcg Subcutaneous Once Lequita Asal,  MD       Facility-Administered Medications Ordered in Other Visits  Medication Dose Route Frequency Provider Last Rate Last Dose  . heparin lock flush 100 unit/mL  500 Units Intravenous Once Corcoran, Melissa C, MD      . sodium chloride 0.9 % injection 10 mL  10 mL Intracatheter PRN Corcoran, Melissa C, MD      . sodium chloride 0.9 % injection 10 mL  10 mL Intravenous PRN Lequita Asal, MD   10 mL at 04/13/15 [phone]  Review of Systems:  GENERAL:  Feels "good".  No fever, chills or sweats. PERFORMANCE STATUS (ECOG):  1 HEENT:  No visual changes, sore throat, mouth sores or tenderness. Lungs:  No shortness of breath or cough.  No hemoptysis. Cardiac:  No chest pain, palpitations, orthopnea, or PND.  Took BP pill this AM. GI:  No abdominal pain.  No nausea, vomiting, diarrhea, melena or hematochezia. GU:  UTI symptoms (see HPI).  Dysuria.  No urgency, frequency, or hematuria. Musculoskeletal:  Severe rheumatoid arthritis (off MTX and on prednisone).  Osteoporosis.  No back pain. No muscle tenderness. Extremities:  No pain or swelling. Skin:  No increased bruising or bleeding.  No rashes or skin changes. Neuro:  Neuropathy (stable).  No headache, numbness or weakness, balance or coordination issues. Endocrine:  Diabetes on an insulin pump.  Occasional hot flashes.  Psych:  No mood changes, depression or anxiety. Pain: No pain. Review of systems:  All other systems reviewed and found to be negative.  Physical Exam: 138 Blood pressure 120/60, pulse 77, temperature 97.4 F (36.3 C), temperature source Tympanic, height 5' 3"  (1.6 m), weight 134 lb 6.4 oz (61 kg). GENERAL:  Well developed, well nourished woman sitting comfortably in the exam room in no acute distress. MENTAL STATUS:  Alert and oriented to person, place and time. HEAD:  Curly gray hair.  Normocephalic, atraumatic, face symmetric, no Cushingoid features. EYES:  Gold rimmed glasses.  Blue eyes.  No conjunctivitis or  scleral icterus.  ENT: Oropharynx clear without lesion. Tongue normal. Mucous membranes moist.  RESPIRATORY: Clear to auscultation without rales, wheezes or rhonchi. CARDIOVASCULAR: Regular rate and rhythm without murmur, rub or gallop. ABDOMEN: Soft, non-tender with active bowel sounds and no hepatosplenomegaly. No palpable nodularity or masses.  Insulin pump.  Ostomy bag. SKIN: Slight bruising on abdomen s/p Lovenox injections. No rashes or ulcers. EXTREMITIES:  No edema, skin discoloration or tenderness. No palpable cords. LYMPH NODES: No palpable cervical, supraclavicular, axillary or inguinal adenopathy  NEUROLOGICAL: Appropriate. PSYCH: Appropriate.   Radiology studies: 02/13/2015:  Abdomen and pelvic CT revealed bilateral mass-like adnexal regions (right adnexal mass 5.6 x 5.0 cm and the left adnexa mass 10.3 x 6.0 x 9.9 cm).  There was a large amount of soft tissue throughout the peritoneal cavity involving the omentum and other peritoneal surfaces.  There was a small volume ascites. There was a peripheral 3.3 x 1.9 cm low-attenuation lesion overlying the right lobe of the liver, likely representing a serosal implant. There was 1.4 x 2.2 cm ill-defined peripheral lesion within the inferior aspect of segment 6 adjacent to the ampulla in the duodenum.  04/28/2015:  Abdominal and pelvic CT revealed decreasing bilateral ovarian masses.   The left adnexal mass measured 4.0 x 6.1 cm (previously 5.0 x 8.5 cm). The right ovary measured 3.8 x 4.9 cm (previously 4.7 x 5.6 cm).  There was improved peritoneal carcinomatosis.  There was a small amount of ascites.  The hepatic dome lesion was stable. The previously seen right hepatic lobe lesion was not well-visualized.  There was a small left pleural effusion and trace right pleural effusion.  The nodular lesion at the ampulla of Vater, extending into the duodenum was stable.  There was no biliary ductal dilatation. 08/01/2015:  Rght upper  extremity ultrasound revealed a near occlusive thrombus within the central portion of the right internal jugular vein and central portion of the right subclavian vein. 09/01/2015:  Chest, abdomen, and pelvic CT revealed continued decrease in perihepatic  fluid collection contiguous with the right pleural space with percutaneous drain.  09/11/2015:  Chest, abdomen, and pelvic CT revealed a residual versus recurrent fluid collection posterior to the right hepatic lobe (2.6 x 1.1 x 4.0 cm).   10/13/2015:  Chest, abdomen, and pelvic CT revealed resolution of the empyema. 02/15/2016:  Chest, abdomen, and pelvic CT revealed multiple soft tissue nodules throughout the pelvis, small bowel mesenteric and peritoneum and, concerning for peritoneal metastatic disease.  There was soft tissue irregularity within the left upper quadrant.  There was mild right hydronephrosis (etiology unclear).  There was interval resolution of previously described right pleural based fluid and gas collection. There was a pleural based nodule within the right lower hemi-thorax concerning for pleural based metastasis.  There were small bilateral pleural effusions.  There was pulmonary nodularity, predominately within the left upper lobe (metastatic or infectious/inflammatory etiology).  There was slightly increased mediastinal adenopathy (infectious/inflammatory or metastatic).  There was a low attenuation lesion within the left hepatic lobe (complicated fluid within the fissure or metastatic disease). 03/08/2016:  Abdomen and pelvic CT revealed mild progression of peritoneal disease in the abdomen.  There was interval increase in loculated fluid around the lateral segment left liver, stomach, and spleen.  There was persistent soft tissue lesion at the level of the ampulla with mild intra and extrahepatic biliary duct dilatation.  There was persistent intrahepatic and capsular metastatic disease involving the liver. 05/26/2016:  Head MRI  revealed multiple small areas of acute infarct involving the occipital parietal lobe bilaterally and the left lateral cerebellum,  consistent with posterior circulation emboli.  06/10/2016:  Adomen and pelvic CT revealed progression of peritoneal carcinomatosis predominantly in the perihepatic space, bilateral lower quadrants and pelvis.  There was worsening bilateral obstructive uropathy due to malignant involvement of the pelvic ureters.  There was stable small pleural/subpleural nodules at the right lung base.  There was stable small left pleural effusion.  There was decreased small volume perihepatic ascites.  08/09/2016:  Abdomen and pelvic CT revealed interval increase and loculated fluid collections within the peritoneal space consistent with progression of peritoneal ovarian carcinoma metastasis.  There was one large 7 cm collection in the RIGHT lower quadrant which had increased significantly in size and may represent of point of small bowel obstruction.  The proximal stomach and duodenum were decompressed. The mid small bowel was mildly dilated. There was concern for obstruction given the large intraperitoneal fluid collection.  There was increase in subcapsular hepatic metastatic fluid collections.  There was enlargement of rounded ampullary lesion in the second portion duodenum concerning for metastatic lesion.  There was mild biliary duct dilatation (stable).  There was nodular pleural metastasis in the RIGHT lower lobe pleural space.   Clinical Support on 09/12/2016  Component Date Value Ref Range Status  . WBC 09/12/2016 6.3  3.6 - 11.0 K/uL Final  . RBC 09/12/2016 2.97* 3.80 - 5.20 MIL/uL Final  . Hemoglobin 09/12/2016 9.2* 12.0 - 16.0 g/dL Final  . HCT 09/12/2016 26.5* 35.0 - 47.0 % Final  . MCV 09/12/2016 89.2  80.0 - 100.0 fL Final  . MCH 09/12/2016 30.8  26.0 - 34.0 pg Final  . MCHC 09/12/2016 34.6  32.0 - 36.0 g/dL Final  . RDW 09/12/2016 14.4  11.5 - 14.5 % Final  . Platelets  09/12/2016 364  150 - 440 K/uL Final  . Neutrophils Relative % 09/12/2016 73  % Final  . Neutro Abs 09/12/2016 4.7  1.4 - 6.5 K/uL Final  .  Lymphocytes Relative 09/12/2016 10  % Final  . Lymphs Abs 09/12/2016 0.6* 1.0 - 3.6 K/uL Final  . Monocytes Relative 09/12/2016 15  % Final  . Monocytes Absolute 09/12/2016 0.9  0.2 - 0.9 K/uL Final  . Eosinophils Relative 09/12/2016 1  % Final  . Eosinophils Absolute 09/12/2016 0.0  0 - 0.7 K/uL Final  . Basophils Relative 09/12/2016 1  % Final  . Basophils Absolute 09/12/2016 0.0  0 - 0.1 K/uL Final  . Sodium 09/12/2016 133* 135 - 145 mmol/L Final  . Potassium 09/12/2016 4.2  3.5 - 5.1 mmol/L Final  . Chloride 09/12/2016 99* 101 - 111 mmol/L Final  . CO2 09/12/2016 27  22 - 32 mmol/L Final  . Glucose, Bld 09/12/2016 234* 65 - 99 mg/dL Final  . BUN 09/12/2016 17  6 - 20 mg/dL Final  . Creatinine, Ser 09/12/2016 0.87  0.44 - 1.00 mg/dL Final  . Calcium 09/12/2016 9.2  8.9 - 10.3 mg/dL Final  . Total Protein 09/12/2016 6.8  6.5 - 8.1 g/dL Final  . Albumin 09/12/2016 3.3* 3.5 - 5.0 g/dL Final  . AST 09/12/2016 15  15 - 41 U/L Final  . ALT 09/12/2016 11* 14 - 54 U/L Final  . Alkaline Phosphatase 09/12/2016 99  38 - 126 U/L Final  . Total Bilirubin 09/12/2016 0.5  0.3 - 1.2 mg/dL Final  . GFR calc non Af Amer 09/12/2016 >60  >60 mL/min Final  . GFR calc Af Amer 09/12/2016 >60  >60 mL/min Final   Comment: (NOTE) The eGFR has been calculated using the CKD EPI equation. This calculation has not been validated in all clinical situations. eGFR's persistently <60 mL/min signify possible Chronic Kidney Disease.   . Anion gap 09/12/2016 7  5 - 15 Final  . Magnesium 09/12/2016 1.4* 1.7 - 2.4 mg/dL Final    Assessment:  [name] is a 71 y.o. female with progressive stage IV ovarian cancer.  She presented with abdominal discomfort and bloating.  Omental biopsy on 02/23/2015 revealed metastatic high grade serous carcinoma, consistent with gynecologic  origin.   She was initially diagnosed with clinical stage IIIC (T3cN1Mx).  CA125 was 707 on 02/17/2015.  She received 4 cycles of neoadjuvant carboplatin and Taxol (03/05/2015 - 05/22/2015).  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  She had loose stools on oral magnesium.  She was initially on Neurontin then switched Lyrica with cycle #3.  Cycle #4 was notable for neutropenia (ANC 300) requiring GCSF x 3 days.    She received tamoxifen from 02/25/2016 - 03/14/2016.  She received 4 cycles of carboplatin and gemcitabine (03/21/2016 - 05/24/2016) with GCSF/Neulasta support.  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  She received 2 cycles of Doxil (06/21/2016 - 07/19/2016) with Neulasta support.   CA125 was 802.9 on 03/30/2015, 567.9 on 04/13/2015, 168.8 on 05/15/2015, 85.2 on 07/17/2015, 68.6 on 07/28/2015, 34.5 on 09/17/2015, 20.7 on 11/06/2015, 49 on 01/22/2016, 106.1 on 02/22/2016, 138.8 on 03/07/2016, 220.8 on 03/21/2016, 152.8 on 04/11/2016, 125.6 on 04/18/2016, 76.8 on 05/03/2016, 60.2 on 05/24/2016, 44 on 07/19/2016, and 65.8 on 08/17/2016.  She underwent exploratory laparotomy, lysis of adhesions, total abdominal hysterectomy with bilateral salpingo-oophorectomy, infracolic omentectomy, optimal tumor debulking(< 1 cm), recto-sigmoid resection with creation of end colostomy, cholecystectomy, mobilization of splenic flexure and liver with diaphragmatic stripping on 06/15/2015. The right diaphragm was cleared of tumor. During dissection, the diaphragm was entered and closed with sutures.   She had a recurrent right  sided pleural effusion.  She underwent thoracentesis of 650 cc on post-operative day 3.  She was admitted to [facility] on 07/01/2015 and 07/07/2015 for recurrent shortness of breath.  She underwent 2 additional thoracenteses (1.1 L on 07/02/2015 and 850 cc on 07/08/2015).  Cytology was negative x 2.  Bilateral lower extremity duplex on 07/03/2015 was negative.  Echo  revealed an EF of 55-60% on 07/08/2015 and 60-65% on 05/27/2016.    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 She was on Lovenox 60 mg twice a day.  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  She was admitted to [facility] from 07/29/2015 - 08/10/2015 with a right-sided empyema and liver abscess. She underwent CT-guided placement of a liver abscess drain on 07/30/2015.  Liver abscess culture grew out group B strep and Enterobacter which was sensitive to Zosyn. She was transitioned to ertapenem Colbert Ewing) prior to discharge.  She was readmitted to [facility] on 09/17/2015.  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  Head MRI on 05/26/2016 revealed multiple small areas of acute infarct involving the occipital parietal lobe bilaterally and the left lateral cerebellum,  consistent with posterior circulation emboli.  Work-up included a negative carotid ultrasound and echocardiogram.  She was admitted to [facility] from 08/10/2016 - 08/14/2016 with a small bowel obstruction.  She was managed conservatively.  She has severe rheumatoid arthritis.  Methotrexate and Enbrel were initially on hold.  She has a normocytic anemia.  Work-up on 02/17/2015 and 05/24/2016 revealed a normal ferritin, B12, folate, TSH.  She denies any melena or hematochezia.    She has anemia due to chronic disease. She received 1 unit PRBCs during her admission at [facility]. She denies any melena or hematochezia. She has diabetes and is on an insulin pump.  She has had persistent neutropenia felt secondary to her rheumatoid arthritis.  Folate and MMA were normal.  TSH was 6.13 (high) with a free T4 of 1.17 (0.61-1.12).  She began methotrexate (10 mg a week) and prednisone (5 mg a day) for severe rheumatoid  arthritis on 12/17/2015.  She remains on prednisone alone (5 mg a day).  Bone marrow aspirate and biopsy on 06/09/2010 revealed a hypercellular marrow (70%) with no evidence of dysplasia or malignancy.  Flow cytometry was negative.  Cytogenetics were normal (46,XX).  FISH studies were negative for MDS.  Bone marrow aspirate and biopsy on 12/03/2015 revealed a normocellular to mildy hypercellular marrow for age (40%) with left shifted myelopoiesis, non specific dyserythropoiesis and mild megakaryocytic atypia with no increase in blasts.  There were multiple small nonspecific lymphoid aggregates (favor reactive). There was no increase in reticulin.  There was decreased myeloid cells (37%) with left shifted maturation and 1% atypical myelod blasts.  There was relatively increased monocytic cells (11%), relatively increased lymphoid cells (36%), and relatively increased eosinophils (6%).  Cytogenetics were normal (77, XX).  SNP microarray was normal.  Abdomen and pelvic CT scan on 08/09/2016 revealed interval increase and loculated fluid collections within the peritoneal space consistent with progression of peritoneal ovarian carcinoma metastasis.  There was one large 7 cm collection in the RIGHT lower quadrant which had increased significantly in size and may represent of point of small bowel obstruction.  There was increase in subcapsular hepatic metastatic fluid collections.  There was enlargement of rounded ampullary lesion in the second portion duodenum concerning for metastatic lesion.  There was mild biliary duct dilatation (stable).  There was nodular pleural metastasis in the RIGHT lower lobe pleural space.  She is currently day 11 of cycle #1 topotecan with Neulasta support (08/22/2016).  She was diagnosed with a Serratia marcescens UTI on 08/26/2016.  She is on ciprofloxacin.  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  She receives IV magnesium weekly.  She has chemotherapy induced  anemia.  Labs on 08/22/2016 revealed a normal B12 and folate.  Ferritin was 37, iron saturation 11% and TIBC 281.  Code status is DNR/DNI.  Symptomatically, she has dysuria.  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  Counts have recovered.  Plan: 1.  Labs today:  CBC with diff, CMP, Mg, CA125. 2.  No chemotherapy today secondary to probable UTI. 3.  Magnesium 4 gm IV today 4.  UA and culture 5.  RTC in 1 week for MD assessment, labs (CBC with, CMP, Mg)+ topotecan + IV Mg.   Lequita Asal, MD  09/12/2016, 10:40 AM

## 2016-09-13 ENCOUNTER — Inpatient Hospital Stay: Payer: Medicare Other

## 2016-09-13 LAB — CA 125: CA 125: 53.4 U/mL — ABNORMAL HIGH (ref 0.0–38.1)

## 2016-09-14 ENCOUNTER — Telehealth: Payer: Self-pay | Admitting: *Deleted

## 2016-09-14 LAB — URINE CULTURE: Culture: 60000 — AB

## 2016-09-14 NOTE — Telephone Encounter (Signed)
Called patient and informed her of her CA125 results that had decreased, and she reported that she was feeling better on her antibiotics from the UTI. Patient instructed to call for issues, voiced understanding.

## 2016-09-16 ENCOUNTER — Telehealth: Payer: Self-pay | Admitting: *Deleted

## 2016-09-16 ENCOUNTER — Other Ambulatory Visit: Payer: Self-pay | Admitting: *Deleted

## 2016-09-16 ENCOUNTER — Ambulatory Visit: Payer: Medicare Other

## 2016-09-16 DIAGNOSIS — C801 Malignant (primary) neoplasm, unspecified: Principal | ICD-10-CM

## 2016-09-16 DIAGNOSIS — C786 Secondary malignant neoplasm of retroperitoneum and peritoneum: Secondary | ICD-10-CM

## 2016-09-16 NOTE — Telephone Encounter (Signed)
Called concerned because she does not have a fu appt scheduled to come back

## 2016-09-16 NOTE — Telephone Encounter (Signed)
I will get with Tia and get an appointment

## 2016-09-19 ENCOUNTER — Encounter: Payer: Self-pay | Admitting: Hematology and Oncology

## 2016-09-19 ENCOUNTER — Inpatient Hospital Stay: Payer: Medicare Other

## 2016-09-19 ENCOUNTER — Inpatient Hospital Stay (HOSPITAL_BASED_OUTPATIENT_CLINIC_OR_DEPARTMENT_OTHER): Payer: Medicare Other | Admitting: Hematology and Oncology

## 2016-09-19 VITALS — BP 133/69 | HR 76 | Temp 98.8°F | Resp 18 | Wt 137.2 lb

## 2016-09-19 DIAGNOSIS — C7801 Secondary malignant neoplasm of right lung: Secondary | ICD-10-CM | POA: Diagnosis not present

## 2016-09-19 DIAGNOSIS — Z9641 Presence of insulin pump (external) (internal): Secondary | ICD-10-CM

## 2016-09-19 DIAGNOSIS — E876 Hypokalemia: Secondary | ICD-10-CM

## 2016-09-19 DIAGNOSIS — Z7689 Persons encountering health services in other specified circumstances: Secondary | ICD-10-CM | POA: Diagnosis not present

## 2016-09-19 DIAGNOSIS — G629 Polyneuropathy, unspecified: Secondary | ICD-10-CM

## 2016-09-19 DIAGNOSIS — Z794 Long term (current) use of insulin: Secondary | ICD-10-CM

## 2016-09-19 DIAGNOSIS — C786 Secondary malignant neoplasm of retroperitoneum and peritoneum: Secondary | ICD-10-CM

## 2016-09-19 DIAGNOSIS — I2510 Atherosclerotic heart disease of native coronary artery without angina pectoris: Secondary | ICD-10-CM | POA: Diagnosis not present

## 2016-09-19 DIAGNOSIS — I998 Other disorder of circulatory system: Secondary | ICD-10-CM

## 2016-09-19 DIAGNOSIS — Z87442 Personal history of urinary calculi: Secondary | ICD-10-CM

## 2016-09-19 DIAGNOSIS — Z79899 Other long term (current) drug therapy: Secondary | ICD-10-CM

## 2016-09-19 DIAGNOSIS — N390 Urinary tract infection, site not specified: Secondary | ICD-10-CM | POA: Diagnosis not present

## 2016-09-19 DIAGNOSIS — C787 Secondary malignant neoplasm of liver and intrahepatic bile duct: Secondary | ICD-10-CM | POA: Diagnosis not present

## 2016-09-19 DIAGNOSIS — B9689 Other specified bacterial agents as the cause of diseases classified elsewhere: Secondary | ICD-10-CM | POA: Diagnosis not present

## 2016-09-19 DIAGNOSIS — D6481 Anemia due to antineoplastic chemotherapy: Secondary | ICD-10-CM | POA: Diagnosis not present

## 2016-09-19 DIAGNOSIS — C569 Malignant neoplasm of unspecified ovary: Secondary | ICD-10-CM

## 2016-09-19 DIAGNOSIS — E785 Hyperlipidemia, unspecified: Secondary | ICD-10-CM

## 2016-09-19 DIAGNOSIS — C801 Malignant (primary) neoplasm, unspecified: Principal | ICD-10-CM

## 2016-09-19 DIAGNOSIS — M069 Rheumatoid arthritis, unspecified: Secondary | ICD-10-CM

## 2016-09-19 DIAGNOSIS — K449 Diaphragmatic hernia without obstruction or gangrene: Secondary | ICD-10-CM

## 2016-09-19 DIAGNOSIS — Z86718 Personal history of other venous thrombosis and embolism: Secondary | ICD-10-CM

## 2016-09-19 DIAGNOSIS — Z87891 Personal history of nicotine dependence: Secondary | ICD-10-CM

## 2016-09-19 DIAGNOSIS — D709 Neutropenia, unspecified: Secondary | ICD-10-CM | POA: Diagnosis not present

## 2016-09-19 DIAGNOSIS — I5032 Chronic diastolic (congestive) heart failure: Secondary | ICD-10-CM | POA: Diagnosis not present

## 2016-09-19 DIAGNOSIS — Z66 Do not resuscitate: Secondary | ICD-10-CM

## 2016-09-19 DIAGNOSIS — D72819 Decreased white blood cell count, unspecified: Secondary | ICD-10-CM

## 2016-09-19 DIAGNOSIS — K219 Gastro-esophageal reflux disease without esophagitis: Secondary | ICD-10-CM

## 2016-09-19 DIAGNOSIS — E108 Type 1 diabetes mellitus with unspecified complications: Secondary | ICD-10-CM

## 2016-09-19 DIAGNOSIS — Z5111 Encounter for antineoplastic chemotherapy: Secondary | ICD-10-CM

## 2016-09-19 DIAGNOSIS — I252 Old myocardial infarction: Secondary | ICD-10-CM

## 2016-09-19 DIAGNOSIS — Z8673 Personal history of transient ischemic attack (TIA), and cerebral infarction without residual deficits: Secondary | ICD-10-CM

## 2016-09-19 DIAGNOSIS — Z8701 Personal history of pneumonia (recurrent): Secondary | ICD-10-CM

## 2016-09-19 LAB — COMPREHENSIVE METABOLIC PANEL
ALT: 10 U/L — ABNORMAL LOW (ref 14–54)
AST: 15 U/L (ref 15–41)
Albumin: 3.4 g/dL — ABNORMAL LOW (ref 3.5–5.0)
Alkaline Phosphatase: 69 U/L (ref 38–126)
Anion gap: 8 (ref 5–15)
BUN: 20 mg/dL (ref 6–20)
CO2: 26 mmol/L (ref 22–32)
Calcium: 9.3 mg/dL (ref 8.9–10.3)
Chloride: 100 mmol/L — ABNORMAL LOW (ref 101–111)
Creatinine, Ser: 0.85 mg/dL (ref 0.44–1.00)
GFR calc Af Amer: >60 mL/min (ref >60)
GFR calc non Af Amer: >60 mL/min (ref >60)
Glucose, Bld: 208 mg/dL — ABNORMAL HIGH (ref 65–99)
Potassium: 4.4 mmol/L (ref 3.5–5.1)
Sodium: 134 mmol/L — ABNORMAL LOW (ref 135–145)
Total Bilirubin: 0.5 mg/dL (ref 0.3–1.2)
Total Protein: 7.3 g/dL (ref 6.5–8.1)

## 2016-09-19 LAB — CBC WITH DIFFERENTIAL/PLATELET
Basophils Absolute: 0 10*3/uL (ref 0–0.1)
Basophils Relative: 1 %
Eosinophils Absolute: 0 10*3/uL (ref 0–0.7)
Eosinophils Relative: 0 %
HCT: 28 % — ABNORMAL LOW (ref 35.0–47.0)
Hemoglobin: 9.7 g/dL — ABNORMAL LOW (ref 12.0–16.0)
Lymphocytes Relative: 7 %
Lymphs Abs: 0.4 10*3/uL — ABNORMAL LOW (ref 1.0–3.6)
MCH: 31 pg (ref 26.0–34.0)
MCHC: 34.8 g/dL (ref 32.0–36.0)
MCV: 89.1 fL (ref 80.0–100.0)
Monocytes Absolute: 1 10*3/uL — ABNORMAL HIGH (ref 0.2–0.9)
Monocytes Relative: 16 %
Neutro Abs: 4.5 10*3/uL (ref 1.4–6.5)
Neutrophils Relative %: 76 %
Platelets: 290 10*3/uL (ref 150–440)
RBC: 3.14 MIL/uL — ABNORMAL LOW (ref 3.80–5.20)
RDW: 17 % — ABNORMAL HIGH (ref 11.5–14.5)
WBC: 6 10*3/uL (ref 3.6–11.0)

## 2016-09-19 LAB — MAGNESIUM: Magnesium: 1.5 mg/dL — ABNORMAL LOW (ref 1.7–2.4)

## 2016-09-19 MED ORDER — ONDANSETRON HCL 4 MG PO TABS
8.0000 mg | ORAL_TABLET | Freq: Once | ORAL | Status: AC
  Administered 2016-09-19: 8 mg via ORAL
  Filled 2016-09-19: qty 2

## 2016-09-19 MED ORDER — HEPARIN SOD (PORK) LOCK FLUSH 100 UNIT/ML IV SOLN: 500.0000 [IU] | Freq: Once | INTRAVENOUS | Status: AC | PRN

## 2016-09-19 MED ORDER — HEPARIN SOD (PORK) LOCK FLUSH 100 UNIT/ML IV SOLN
500.0000 [IU] | Freq: Once | INTRAVENOUS | Status: AC
Start: 2016-09-19 — End: 2016-09-19
  Administered 2016-09-19: 500 [IU] via INTRAVENOUS
  Filled 2016-09-19: qty 5

## 2016-09-19 MED ORDER — SODIUM CHLORIDE 0.9 % IV SOLN
Freq: Once | INTRAVENOUS | Status: AC
  Administered 2016-09-19: 12:00:00 via INTRAVENOUS
  Filled 2016-09-19: qty 1000

## 2016-09-19 MED ORDER — SODIUM CHLORIDE 0.9% FLUSH
10.0000 mL | INTRAVENOUS | Status: DC | PRN
  Administered 2016-09-19: 10 mL via INTRAVENOUS
  Filled 2016-09-19: qty 10

## 2016-09-19 MED ORDER — TOPOTECAN HCL CHEMO INJECTION 4 MG
1.2500 mg/m2 | Freq: Once | INTRAVENOUS | Status: AC
  Administered 2016-09-19: 2.1 mg via INTRAVENOUS
  Filled 2016-09-19: qty 2.1

## 2016-09-19 MED ORDER — MAGNESIUM SULFATE 2 GM/50ML IV SOLN
2.0000 g | Freq: Once | INTRAVENOUS | Status: AC
  Administered 2016-09-19: 2 g via INTRAVENOUS
  Filled 2016-09-19: qty 50

## 2016-09-19 NOTE — Progress Notes (Signed)
[facility] day:  09/19/2016   Chief Complaint: [name] is a 71 y.o. female with stage IV ovarian cancer who is seen for assessment prior to cycle #2 topotecan.  HPI:  The patient was last seen in the medical oncology clinic on 09/12/2016.  At that time, cycle #2 topotecan was held secondary to a UTI.  Urine culture grew Serratia marcescens.  She was treated with ciprofloxacin.  During the interim, she has done well.  She lost her hair.  She denies any urinary symptoms.  She denies any fever.    Past Medical History:  Diagnosis Date  . CAD (coronary artery disease)    a. 09/2012 Cath: LM nl, LAD 95p, 69m LCX 977mOM2 50, RCA 100.  . Marland Kitchenholelithiasis   . Chronic diastolic CHF (congestive heart failure) ([place]  . Collagen vascular disease (H[place]  . Esophageal stricture   . Exertional shortness of breath   . GERD (gastroesophageal reflux disease)   . H/O hiatal hernia   . Herniated disc   . History of pancytopenia   . Hyperlipidemia   . Hypertension   . Hypokalemia   . Leukopenia 2012   s/p bone marrow biopsy, Dr. PaMa Hillock. NSTEMI (non-ST elevated myocardial infarction) (HCL[place]5/2014   "mild" (10/18/2012)  . Ovarian cancer ([facility]2016   chemo  . Pneumonia 2013; 08/2012   "one lung; double" (10/18/2012)  . Rheumatoid arthritis(714.0)   . Type I diabetes mellitus ([place]   "dx'd in 1957" (10/18/2012)    Past Surgical History:  Procedure Laterality Date  . ABDOMINAL HYSTERECTOMY  06/15/2015   Dx L/S, EXLAP TAH BSO omentectomy RSRx colostomy diaphragm resection stripping  . CARDIAC CATHETERIZATION  10/18/2012   "first one was today" (10/18/2012)  . CATARACT EXTRACTION W/ INTRAOCULAR LENS IMPLANT Right 2010  . CHOLECYSTECTOMY  06/15/2015   combined case with ovarian cancer debulking  . COLON SURGERY    . CORONARY ARTERY BYPASS GRAFT N/A 10/19/2012   Procedure: CORONARY ARTERY BYPASS GRAFTING (CABG);  Surgeon: StMelrose NakayamaMD;   Location: [facility] Service: Open Heart Surgery;  Laterality: N/A;  . ESOPHAGEAL DILATION     "3 or 4 times" (10/19/2011)  . ESOPHAGOGASTRODUODENOSCOPY  2012   Dr. IfMinna Merritts. OSTOMY    . OVARY SURGERY     removal  . PERIPHERAL VASCULAR CATHETERIZATION N/A 03/02/2015   Procedure: PoGlori Luisath Insertion;  Surgeon: JaAlgernon HuxleyMD;  [place] [facility];  Service: Cardiovascular;  Laterality: N/A;  . TUBAL LIGATION  1970    Family History  Problem Relation Age of Onset  . Diabetes Mother   . Arthritis Mother   . Diabetes Father   . Arthritis Father   . Bone cancer Sister     Social History:  reports that she quit smoking about 22 years ago. Her smoking use included Cigarettes. She has a 30.00 pack-year smoking history. She has never used smokeless tobacco. She reports that she does not drink alcohol or use drugs.  She is accompanied by her husband today.  Allergies:  Allergies  Allergen Reactions  . Codeine Nausea And Vomiting  . Latex Rash    Current Medications: Current Outpatient Prescriptions  Medication Sig Dispense Refill  . Calcium-Vitamin D 600-200 MG-UNIT tablet Take 1 tablet by mouth 2 (two) times daily.     . cholecalciferol (VITAMIN D) 1000 units tablet Take 1,000 Units by mouth daily.    .Marland Kitchen  ciprofloxacin (CIPRO) 500 MG tablet Take 1 tablet (500 mg total) by mouth 2 (two) times daily. 14 tablet 0  . enoxaparin (LOVENOX) 60 MG/0.6ML injection INJECT 0.6 ML (60 MG TOTAL) INTO THE SKIN EVERY 12 HOURS 36 mL 2  . folic acid (FOLVITE) 1 MG tablet Take 1 tablet (1 mg total) by mouth daily. 90 tablet 3  . furosemide (LASIX) 80 MG tablet Take 0.5 tablets by mouth 2 (two) times daily.    . insulin lispro (HUMALOG) 100 UNIT/ML injection Inject 0.06 mLs (6 Units total) into the skin daily. (Patient taking differently: Inject 0-85 Units into the skin daily. Via insulin pump) 30 mL 3  . lidocaine-prilocaine (EMLA) cream APPLY 1 APPLICATION TOPICALLY AS NEEDED 1 HOUR PRIOR TO  TREATMENT. COVER WITH PRESS N SEAL UNTIL TREATMENT TIME AS DIRECTED 30 g 1  . loratadine (CLARITIN) 10 MG tablet Take 1 tablet by mouth daily as needed.    . magnesium oxide (MAG-OX) 400 (241.3 Mg) MG tablet TAKE 1 TABLET BY MOUTH ONCE DAILY 30 tablet 0  . magnesium oxide (MAG-OX) 400 (241.3 Mg) MG tablet TAKE 1 TABLET BY MOUTH ONCE DAILY 30 tablet 1  . metoprolol tartrate (LOPRESSOR) 25 MG tablet Take 1 tablet (25 mg total) by mouth 2 (two) times daily. 180 tablet 3  . ondansetron (ZOFRAN) 8 MG tablet TAKE 1 TABLET BY MOUTH EVERY 8 HOURS AS NEEDED FOR NAUSEA OR VOMITING 30 tablet 1  . ONE TOUCH ULTRA TEST test strip     . pantoprazole (PROTONIX) 40 MG tablet Take 1 tablet (40 mg total) by mouth 2 (two) times daily. 180 tablet 1  . potassium chloride SA (K-DUR,KLOR-CON) 20 MEQ tablet Take 1 tablet (20 mEq total) by mouth daily. 30 tablet 0  . predniSONE (DELTASONE) 5 MG tablet Take 5 mg by mouth daily with breakfast.    . ranitidine (ZANTAC) 150 MG tablet Take 1 tablet (150 mg total) by mouth 2 (two) times daily. 180 tablet 1  . vitamin C (ASCORBIC ACID) 500 MG tablet Take 500 mg by mouth daily.    Marland Kitchen zinc gluconate 50 MG tablet Take 50 mg by mouth daily.    Marland Kitchen losartan (COZAAR) 25 MG tablet Take 1 tablet (25 mg total) by mouth daily. (Patient not taking: Reported on 09/19/2016) 90 tablet 2   Current Facility-Administered Medications  Medication Dose Route Frequency Provider Last Rate Last Dose  . Tbo-Filgrastim (GRANIX) injection 300 mcg  300 mcg Subcutaneous Once Lequita Asal, MD       Facility-Administered Medications Ordered in Other Visits  Medication Dose Route Frequency Provider Last Rate Last Dose  . heparin lock flush 100 unit/mL  500 Units Intravenous Once Corcoran, Melissa C, MD      . sodium chloride 0.9 % injection 10 mL  10 mL Intracatheter PRN Corcoran, Melissa C, MD      . sodium chloride 0.9 % injection 10 mL  10 mL Intravenous PRN Lequita Asal, MD   10 mL at  04/13/15 0852  . sodium chloride flush (NS) 0.9 % injection 10 mL  10 mL Intravenous PRN Lequita Asal, MD   10 mL at 09/19/16 0935    Review of Systems:  GENERAL:  Feels "a little fatigued".  No fever, chills or sweats.  Weight up 3 pound. PERFORMANCE STATUS (ECOG):  1 HEENT:  No visual changes, sore throat, mouth sores or tenderness. Lungs:  No shortness of breath or cough.  No hemoptysis. Cardiac:  No  chest pain, palpitations, orthopnea, or PND.  Took BP pill this AM. GI:  No abdominal pain.  No nausea, vomiting, diarrhea, melena or hematochezia. GU:  UTI symptoms resolved.  No urgency, frequency, dysuria, or hematuria. Musculoskeletal:  Severe rheumatoid arthritis (off MTX and on prednisone).  Osteoporosis.  No back pain. No muscle tenderness. Extremities:  No pain or swelling. Skin:  Hair fell out.  No increased bruising or bleeding.  No rashes or skin changes. Neuro:  Neuropathy (stable).  No headache, numbness or weakness, balance or coordination issues. Endocrine:  Diabetes on an insulin pump.  Occasional hot flashes.  Psych:  No mood changes, depression or anxiety. Pain: No pain. Review of systems:  All other systems reviewed and found to be negative.  Physical Exam:  Blood pressure 133/69, pulse 76, temperature 98.8 F (37.1 C), temperature source Tympanic, resp. rate 18, weight 137 lb 3 oz (62.2 kg). GENERAL:  Well developed, well nourished woman sitting comfortably in the exam room in no acute distress. MENTAL STATUS:  Alert and oriented to person, place and time. HEAD:  Alopecia.  Normocephalic, atraumatic, face symmetric, no Cushingoid features. EYES:  Gold rimmed glasses.  Blue eyes.  No conjunctivitis or scleral icterus.  ENT: Oropharynx clear without lesion. Tongue normal. Mucous membranes moist.  RESPIRATORY: Clear to auscultation without rales, wheezes or rhonchi. CARDIOVASCULAR: Regular rate and rhythm without murmur, rub or gallop. ABDOMEN: Soft,  non-tender with active bowel sounds and no hepatosplenomegaly. No palpable nodularity or masses.  Insulin pump.  Ostomy bag. SKIN: Slight bruising on abdomen s/p Lovenox injections. No rashes or ulcers. EXTREMITIES:  No edema, skin discoloration or tenderness. No palpable cords. LYMPH NODES: No palpable cervical, supraclavicular, axillary or inguinal adenopathy  NEUROLOGICAL: Appropriate. PSYCH: Appropriate.   Radiology studies: 02/13/2015:  Abdomen and pelvic CT revealed bilateral mass-like adnexal regions (right adnexal mass 5.6 x 5.0 cm and the left adnexa mass 10.3 x 6.0 x 9.9 cm).  There was a large amount of soft tissue throughout the peritoneal cavity involving the omentum and other peritoneal surfaces.  There was a small volume ascites. There was a peripheral 3.3 x 1.9 cm low-attenuation lesion overlying the right lobe of the liver, likely representing a serosal implant. There was 1.4 x 2.2 cm ill-defined peripheral lesion within the inferior aspect of segment 6 adjacent to the ampulla in the duodenum.  04/28/2015:  Abdominal and pelvic CT revealed decreasing bilateral ovarian masses.   The left adnexal mass measured 4.0 x 6.1 cm (previously 5.0 x 8.5 cm). The right ovary measured 3.8 x 4.9 cm (previously 4.7 x 5.6 cm).  There was improved peritoneal carcinomatosis.  There was a small amount of ascites.  The hepatic dome lesion was stable. The previously seen right hepatic lobe lesion was not well-visualized.  There was a small left pleural effusion and trace right pleural effusion.  The nodular lesion at the ampulla of Vater, extending into the duodenum was stable.  There was no biliary ductal dilatation. 08/01/2015:  Rght upper extremity ultrasound revealed a near occlusive thrombus within the central portion of the right internal jugular vein and central portion of the right subclavian vein. 09/01/2015:  Chest, abdomen, and pelvic CT revealed continued decrease in perihepatic fluid  collection contiguous with the right pleural space with percutaneous drain.  09/11/2015:  Chest, abdomen, and pelvic CT revealed a residual versus recurrent fluid collection posterior to the right hepatic lobe (2.6 x 1.1 x 4.0 cm).   10/13/2015:  Chest, abdomen, and pelvic  CT revealed resolution of the empyema. 02/15/2016:  Chest, abdomen, and pelvic CT revealed multiple soft tissue nodules throughout the pelvis, small bowel mesenteric and peritoneum and, concerning for peritoneal metastatic disease.  There was soft tissue irregularity within the left upper quadrant.  There was mild right hydronephrosis (etiology unclear).  There was interval resolution of previously described right pleural based fluid and gas collection. There was a pleural based nodule within the right lower hemi-thorax concerning for pleural based metastasis.  There were small bilateral pleural effusions.  There was pulmonary nodularity, predominately within the left upper lobe (metastatic or infectious/inflammatory etiology).  There was slightly increased mediastinal adenopathy (infectious/inflammatory or metastatic).  There was a low attenuation lesion within the left hepatic lobe (complicated fluid within the fissure or metastatic disease). 03/08/2016:  Abdomen and pelvic CT revealed mild progression of peritoneal disease in the abdomen.  There was interval increase in loculated fluid around the lateral segment left liver, stomach, and spleen.  There was persistent soft tissue lesion at the level of the ampulla with mild intra and extrahepatic biliary duct dilatation.  There was persistent intrahepatic and capsular metastatic disease involving the liver. 05/26/2016:  Head MRI revealed multiple small areas of acute infarct involving the occipital parietal lobe bilaterally and the left lateral cerebellum,  consistent with posterior circulation emboli.  06/10/2016:  Adomen and pelvic CT revealed progression of peritoneal carcinomatosis  predominantly in the perihepatic space, bilateral lower quadrants and pelvis.  There was worsening bilateral obstructive uropathy due to malignant involvement of the pelvic ureters.  There was stable small pleural/subpleural nodules at the right lung base.  There was stable small left pleural effusion.  There was decreased small volume perihepatic ascites.  08/09/2016:  Abdomen and pelvic CT revealed interval increase and loculated fluid collections within the peritoneal space consistent with progression of peritoneal ovarian carcinoma metastasis.  There was one large 7 cm collection in the RIGHT lower quadrant which had increased significantly in size and may represent of point of small bowel obstruction.  The proximal stomach and duodenum were decompressed. The mid small bowel was mildly dilated. There was concern for obstruction given the large intraperitoneal fluid collection.  There was increase in subcapsular hepatic metastatic fluid collections.  There was enlargement of rounded ampullary lesion in the second portion duodenum concerning for metastatic lesion.  There was mild biliary duct dilatation (stable).  There was nodular pleural metastasis in the RIGHT lower lobe pleural space.   Infusion on 09/19/2016  Component Date Value Ref Range Status  . Sodium 09/19/2016 134* 135 - 145 mmol/L Final  . Potassium 09/19/2016 4.4  3.5 - 5.1 mmol/L Final  . Chloride 09/19/2016 100* 101 - 111 mmol/L Final  . CO2 09/19/2016 26  22 - 32 mmol/L Final  . Glucose, Bld 09/19/2016 208* 65 - 99 mg/dL Final  . BUN 09/19/2016 20  6 - 20 mg/dL Final  . Creatinine, Ser 09/19/2016 0.85  0.44 - 1.00 mg/dL Final  . Calcium 09/19/2016 9.3  8.9 - 10.3 mg/dL Final  . Total Protein 09/19/2016 7.3  6.5 - 8.1 g/dL Final  . Albumin 09/19/2016 3.4* 3.5 - 5.0 g/dL Final  . AST 09/19/2016 15  15 - 41 U/L Final  . ALT 09/19/2016 10* 14 - 54 U/L Final  . Alkaline Phosphatase 09/19/2016 69  38 - 126 U/L Final  . Total Bilirubin  09/19/2016 0.5  0.3 - 1.2 mg/dL Final  . GFR calc non Af Amer 09/19/2016 >60  >60 mL/min  Final  . GFR calc Af Amer 09/19/2016 >60  >60 mL/min Final   Comment: (NOTE) The eGFR has been calculated using the CKD EPI equation. This calculation has not been validated in all clinical situations. eGFR's persistently <60 mL/min signify possible Chronic Kidney Disease.   . Anion gap 09/19/2016 8  5 - 15 Final  . WBC 09/19/2016 6.0  3.6 - 11.0 K/uL Final  . RBC 09/19/2016 3.14* 3.80 - 5.20 MIL/uL Final  . Hemoglobin 09/19/2016 9.7* 12.0 - 16.0 g/dL Final  . HCT 09/19/2016 28.0* 35.0 - 47.0 % Final  . MCV 09/19/2016 89.1  80.0 - 100.0 fL Final  . MCH 09/19/2016 31.0  26.0 - 34.0 pg Final  . MCHC 09/19/2016 34.8  32.0 - 36.0 g/dL Final  . RDW 09/19/2016 17.0* 11.5 - 14.5 % Final  . Platelets 09/19/2016 290  150 - 440 K/uL Final  . Neutrophils Relative % 09/19/2016 76  % Final  . Neutro Abs 09/19/2016 4.5  1.4 - 6.5 K/uL Final  . Lymphocytes Relative 09/19/2016 7  % Final  . Lymphs Abs 09/19/2016 0.4* 1.0 - 3.6 K/uL Final  . Monocytes Relative 09/19/2016 16  % Final  . Monocytes Absolute 09/19/2016 1.0* 0.2 - 0.9 K/uL Final  . Eosinophils Relative 09/19/2016 0  % Final  . Eosinophils Absolute 09/19/2016 0.0  0 - 0.7 K/uL Final  . Basophils Relative 09/19/2016 1  % Final  . Basophils Absolute 09/19/2016 0.0  0 - 0.1 K/uL Final  . Magnesium 09/19/2016 1.5* 1.7 - 2.4 mg/dL Final    Assessment:  [name] is a 71 y.o. female with progressive stage IV ovarian cancer.  She presented with abdominal discomfort and bloating.  Omental biopsy on 02/23/2015 revealed metastatic high grade serous carcinoma, consistent with gynecologic origin.   She was initially diagnosed with clinical stage IIIC (T3cN1Mx).  CA125 was 707 on 02/17/2015.  She received 4 cycles of neoadjuvant carboplatin and Taxol (03/05/2015 - 05/22/2015).  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  She had loose stools on oral  magnesium.  She was initially on Neurontin then switched Lyrica with cycle #3.  Cycle #4 was notable for neutropenia (ANC 300) requiring GCSF x 3 days.    She received tamoxifen from 02/25/2016 - 03/14/2016.  She received 4 cycles of carboplatin and gemcitabine (03/21/2016 - 05/24/2016) with GCSF/Neulasta support.  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  She received 2 cycles of Doxil (06/21/2016 - 07/19/2016) with Neulasta support.   CA125 was 802.9 on 03/30/2015, 567.9 on 04/13/2015, 168.8 on 05/15/2015, 85.2 on 07/17/2015, 68.6 on 07/28/2015, 34.5 on 09/17/2015, 20.7 on 11/06/2015, 49 on 01/22/2016, 106.1 on 02/22/2016, 138.8 on 03/07/2016, 220.8 on 03/21/2016, 152.8 on 04/11/2016, 125.6 on 04/18/2016, 76.8 on 05/03/2016, 60.2 on 05/24/2016, 44 on 07/19/2016, and 65.8 on 08/17/2016.  She underwent exploratory laparotomy, lysis of adhesions, total abdominal hysterectomy with bilateral salpingo-oophorectomy, infracolic omentectomy, optimal tumor debulking(< 1 cm), recto-sigmoid resection with creation of end colostomy, cholecystectomy, mobilization of splenic flexure and liver with diaphragmatic stripping on 06/15/2015. The right diaphragm was cleared of tumor. During dissection, the diaphragm was entered and closed with sutures.   She had a recurrent right sided pleural effusion.  She underwent thoracentesis of 650 cc on post-operative day 3.  She was admitted to [facility] on 07/01/2015 and 07/07/2015 for recurrent shortness of breath.  She underwent 2 additional thoracenteses (1.1 L on 07/02/2015 and 850 cc on 07/08/2015).  Cytology was negative  x 2.  Bilateral lower extremity duplex on 07/03/2015 was negative.  Echo revealed an EF of 55-60% on 07/08/2015 and 60-65% on 05/27/2016.    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 She was  on Lovenox 60 mg twice a day.  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  She was admitted to [facility] from 07/29/2015 - 08/10/2015 with a right-sided empyema and liver abscess. She underwent CT-guided placement of a liver abscess drain on 07/30/2015.  Liver abscess culture grew out group B strep and Enterobacter which was sensitive to Zosyn. She was transitioned to ertapenem Colbert Ewing) prior to discharge.  She was readmitted to [facility] on 09/17/2015.  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  Head MRI on 05/26/2016 revealed multiple small areas of acute infarct involving the occipital parietal lobe bilaterally and the left lateral cerebellum,  consistent with posterior circulation emboli.  Work-up included a negative carotid ultrasound and echocardiogram.  [facility] - 08/14/2016 with a small bowel obstruction.  She was managed conservatively.  She has severe rheumatoid arthritis.  Methotrexate and Enbrel were initially on hold.  She has a normocytic anemia.  Work-up on 02/17/2015 and 05/24/2016 revealed a normal ferritin, B12, folate, TSH.  She denies any melena or hematochezia.    She has anemia due to chronic disease. She received 1 unit PRBCs during her admission at [facility]. She denies any melena or hematochezia. She has diabetes and is on an insulin pump.  She has had persistent neutropenia felt secondary to her rheumatoid arthritis.  Folate and MMA were normal.  TSH was 6.13 (high) with a free T4 of 1.17 (0.61-1.12).  She began methotrexate (10 mg a week) and prednisone (5 mg a day) for severe rheumatoid arthritis on 12/17/2015.  She remains on prednisone alone (5 mg a day).  Bone marrow aspirate and biopsy on 06/09/2010 revealed a hypercellular marrow (70%) with no evidence of dysplasia or malignancy.  Flow cytometry was negative.  Cytogenetics were normal (46,XX).   FISH studies were negative for MDS.  Bone marrow aspirate and biopsy on 12/03/2015 revealed a normocellular to mildy hypercellular marrow for age (40%) with left shifted myelopoiesis, non specific dyserythropoiesis and mild megakaryocytic atypia with no increase in blasts.  There were multiple small nonspecific lymphoid aggregates (favor reactive). There was no increase in reticulin.  There was decreased myeloid cells (37%) with left shifted maturation and 1% atypical myelod blasts.  There was relatively increased monocytic cells (11%), relatively increased lymphoid cells (36%), and relatively increased eosinophils (6%).  Cytogenetics were normal (80, XX).  SNP microarray was normal.  Abdomen and pelvic CT scan on 08/09/2016 revealed interval increase and loculated fluid collections within the peritoneal space consistent with progression of peritoneal ovarian carcinoma metastasis.  There was one large 7 cm collection in the RIGHT lower quadrant which had increased significantly in size and may represent of point of small bowel obstruction.  There was increase in subcapsular hepatic metastatic fluid collections.  There was enlargement of rounded ampullary lesion in the second portion duodenum concerning for metastatic lesion.  There was mild biliary duct dilatation (stable).  There was nodular pleural metastasis in the RIGHT lower lobe pleural space.  She is currently day 1 of cycle #2 topotecan  with Neulasta support (08/22/2016 - 09/19/2016).  She was diagnosed with a Serratia marcescens UTI on 09/12/2016.  She was treated with a course of ciprofloxacin.  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  She receives IV magnesium weekly.  She has chemotherapy induced anemia.  Labs on 08/22/2016 revealed a normal B12 and folate.  Ferritin was 37, iron saturation 11% and TIBC 281.  Code status is DNR/DNI.  Symptomatically, she feels a little fatigued.  UTI symptoms have resolved.  She has chronic  hypomagnesemia (1.5) secondary to carboplatin.   Plan: 1.  Labs today:  CBC with diff, CMP, Mg. 2.  Begin cycle #2 topotecan (Monday-Friday) with OnProNeulsta on 09/23/2016. 3.  Magnesium 2 gm IV today 4.  RTC weekly x 2 on Mondays for lab (Mg) +/- IV Mg. 5.  Check UA and culture on 10/22/2016. 6.  RTC 06/01 for labs (CBC with diff, Mg) + IV Mg. 7.  RTC 06/11 for MD assessment, labs (CBC with diff, CMP, Mg, CA125), and cycle #4 topotecan for 5 days, +/- IV Mg.   Lequita Asal, MD  09/19/2016, 10:48 AM

## 2016-09-19 NOTE — Progress Notes (Signed)
Patient states she feels weak and fatigued.  She did not get any magnesium last week and feels like it may be low.

## 2016-09-20 ENCOUNTER — Ambulatory Visit: Payer: Medicare Other

## 2016-09-20 ENCOUNTER — Other Ambulatory Visit: Payer: Medicare Other

## 2016-09-20 ENCOUNTER — Inpatient Hospital Stay: Payer: Medicare Other

## 2016-09-20 ENCOUNTER — Ambulatory Visit: Payer: Medicare Other | Admitting: Hematology and Oncology

## 2016-09-20 VITALS — BP 120/70 | HR 79 | Temp 98.2°F | Resp 18

## 2016-09-20 DIAGNOSIS — C569 Malignant neoplasm of unspecified ovary: Secondary | ICD-10-CM

## 2016-09-20 DIAGNOSIS — C786 Secondary malignant neoplasm of retroperitoneum and peritoneum: Secondary | ICD-10-CM

## 2016-09-20 DIAGNOSIS — C801 Malignant (primary) neoplasm, unspecified: Secondary | ICD-10-CM

## 2016-09-20 MED ORDER — ONDANSETRON HCL 4 MG PO TABS
8.0000 mg | ORAL_TABLET | Freq: Once | ORAL | Status: AC
  Administered 2016-09-20: 8 mg via ORAL
  Filled 2016-09-20: qty 2

## 2016-09-20 MED ORDER — HEPARIN SOD (PORK) LOCK FLUSH 100 UNIT/ML IV SOLN
500.0000 [IU] | Freq: Once | INTRAVENOUS | Status: AC | PRN
  Administered 2016-09-20: 500 [IU]
  Filled 2016-09-20: qty 5

## 2016-09-20 MED ORDER — TOPOTECAN HCL CHEMO INJECTION 4 MG
1.2500 mg/m2 | Freq: Once | INTRAVENOUS | Status: AC
  Administered 2016-09-20: 2.1 mg via INTRAVENOUS
  Filled 2016-09-20: qty 2.1

## 2016-09-20 MED ORDER — SODIUM CHLORIDE 0.9 % IV SOLN
Freq: Once | INTRAVENOUS | Status: AC
  Administered 2016-09-20: 14:00:00 via INTRAVENOUS
  Filled 2016-09-20: qty 1000

## 2016-09-21 ENCOUNTER — Inpatient Hospital Stay: Payer: Medicare Other

## 2016-09-21 VITALS — BP 144/69 | HR 82 | Temp 97.7°F | Resp 18

## 2016-09-21 DIAGNOSIS — C569 Malignant neoplasm of unspecified ovary: Secondary | ICD-10-CM | POA: Diagnosis not present

## 2016-09-21 DIAGNOSIS — C786 Secondary malignant neoplasm of retroperitoneum and peritoneum: Secondary | ICD-10-CM

## 2016-09-21 DIAGNOSIS — C801 Malignant (primary) neoplasm, unspecified: Secondary | ICD-10-CM

## 2016-09-21 MED ORDER — SODIUM CHLORIDE 0.9 % IV SOLN
Freq: Once | INTRAVENOUS | Status: AC
  Administered 2016-09-21: 14:00:00 via INTRAVENOUS
  Filled 2016-09-21: qty 1000

## 2016-09-21 MED ORDER — SODIUM CHLORIDE 0.9 % IV SOLN
1.2500 mg/m2 | Freq: Once | INTRAVENOUS | Status: AC
  Administered 2016-09-21: 2.1 mg via INTRAVENOUS
  Filled 2016-09-21: qty 2.1

## 2016-09-21 MED ORDER — SODIUM CHLORIDE 0.9% FLUSH
10.0000 mL | INTRAVENOUS | Status: DC | PRN
  Administered 2016-09-21: 10 mL
  Filled 2016-09-21: qty 10

## 2016-09-21 MED ORDER — HEPARIN SOD (PORK) LOCK FLUSH 100 UNIT/ML IV SOLN
500.0000 [IU] | Freq: Once | INTRAVENOUS | Status: AC | PRN
  Administered 2016-09-21: 500 [IU]
  Filled 2016-09-21: qty 5

## 2016-09-21 MED ORDER — ONDANSETRON HCL 4 MG PO TABS
8.0000 mg | ORAL_TABLET | Freq: Once | ORAL | Status: AC
  Administered 2016-09-21: 8 mg via ORAL
  Filled 2016-09-21: qty 2

## 2016-09-22 ENCOUNTER — Other Ambulatory Visit: Payer: Self-pay

## 2016-09-22 ENCOUNTER — Inpatient Hospital Stay: Payer: Medicare Other

## 2016-09-22 DIAGNOSIS — C786 Secondary malignant neoplasm of retroperitoneum and peritoneum: Secondary | ICD-10-CM

## 2016-09-22 DIAGNOSIS — C801 Malignant (primary) neoplasm, unspecified: Principal | ICD-10-CM

## 2016-09-22 DIAGNOSIS — C569 Malignant neoplasm of unspecified ovary: Secondary | ICD-10-CM | POA: Diagnosis not present

## 2016-09-22 LAB — URINALYSIS, COMPLETE (UACMP) WITH MICROSCOPIC
Bilirubin Urine: NEGATIVE
Glucose, UA: 150 mg/dL — AB
Hgb urine dipstick: NEGATIVE
Ketones, ur: NEGATIVE mg/dL
Leukocytes, UA: NEGATIVE
Nitrite: NEGATIVE
Protein, ur: NEGATIVE mg/dL
Specific Gravity, Urine: 1.014 (ref 1.005–1.030)
pH: 7 (ref 5.0–8.0)

## 2016-09-22 MED ORDER — SODIUM CHLORIDE 0.9 % IV SOLN
1.2500 mg/m2 | Freq: Once | INTRAVENOUS | Status: AC
  Administered 2016-09-22: 2.1 mg via INTRAVENOUS
  Filled 2016-09-22: qty 2.1

## 2016-09-22 MED ORDER — ONDANSETRON HCL 4 MG PO TABS
8.0000 mg | ORAL_TABLET | Freq: Once | ORAL | Status: AC
  Administered 2016-09-22: 8 mg via ORAL
  Filled 2016-09-22: qty 2

## 2016-09-22 MED ORDER — SODIUM CHLORIDE 0.9 % IV SOLN
Freq: Once | INTRAVENOUS | Status: AC
  Administered 2016-09-22: 14:00:00 via INTRAVENOUS
  Filled 2016-09-22: qty 1000

## 2016-09-22 MED ORDER — HEPARIN SOD (PORK) LOCK FLUSH 100 UNIT/ML IV SOLN
500.0000 [IU] | Freq: Once | INTRAVENOUS | Status: AC
  Administered 2016-09-22: 500 [IU] via INTRAVENOUS

## 2016-09-22 MED ORDER — HEPARIN SOD (PORK) LOCK FLUSH 100 UNIT/ML IV SOLN
INTRAVENOUS | Status: AC
  Filled 2016-09-22: qty 5

## 2016-09-23 ENCOUNTER — Inpatient Hospital Stay: Payer: Medicare Other

## 2016-09-23 DIAGNOSIS — C801 Malignant (primary) neoplasm, unspecified: Secondary | ICD-10-CM

## 2016-09-23 DIAGNOSIS — C569 Malignant neoplasm of unspecified ovary: Secondary | ICD-10-CM

## 2016-09-23 DIAGNOSIS — C786 Secondary malignant neoplasm of retroperitoneum and peritoneum: Secondary | ICD-10-CM

## 2016-09-23 MED ORDER — ONDANSETRON HCL 4 MG PO TABS
8.0000 mg | ORAL_TABLET | Freq: Once | ORAL | Status: AC
  Administered 2016-09-23: 8 mg via ORAL
  Filled 2016-09-23: qty 2

## 2016-09-23 MED ORDER — HEPARIN SOD (PORK) LOCK FLUSH 100 UNIT/ML IV SOLN
500.0000 [IU] | Freq: Once | INTRAVENOUS | Status: AC
  Administered 2016-09-23: 500 [IU] via INTRAVENOUS

## 2016-09-23 MED ORDER — PEGFILGRASTIM 6 MG/0.6ML ~~LOC~~ PSKT
6.0000 mg | PREFILLED_SYRINGE | Freq: Once | SUBCUTANEOUS | Status: AC
  Administered 2016-09-23: 6 mg via SUBCUTANEOUS
  Filled 2016-09-23: qty 0.6

## 2016-09-23 MED ORDER — SODIUM CHLORIDE 0.9 % IV SOLN
Freq: Once | INTRAVENOUS | Status: AC
  Administered 2016-09-23: 14:00:00 via INTRAVENOUS
  Filled 2016-09-23: qty 1000

## 2016-09-23 MED ORDER — TOPOTECAN HCL CHEMO INJECTION 4 MG
1.2500 mg/m2 | Freq: Once | INTRAVENOUS | Status: AC
  Administered 2016-09-23: 2.1 mg via INTRAVENOUS
  Filled 2016-09-23: qty 2.1

## 2016-09-23 MED ORDER — SODIUM CHLORIDE 0.9% FLUSH
10.0000 mL | INTRAVENOUS | Status: DC | PRN
  Administered 2016-09-23: 10 mL via INTRAVENOUS
  Filled 2016-09-23: qty 10

## 2016-09-25 LAB — URINE CULTURE: Culture: 10000 — AB

## 2016-09-27 ENCOUNTER — Observation Stay
Admission: AD | Admit: 2016-09-27 | Discharge: 2016-09-28 | Disposition: A | Discharge status: Home / Self Care | Payer: Medicare Other | Source: Ambulatory Visit | Attending: Internal Medicine | Admitting: Internal Medicine

## 2016-09-27 ENCOUNTER — Inpatient Hospital Stay: Payer: Medicare Other

## 2016-09-27 ENCOUNTER — Telehealth: Payer: Self-pay | Admitting: *Deleted

## 2016-09-27 VITALS — BP 148/71 | HR 83 | Temp 99.6°F | Resp 18

## 2016-09-27 DIAGNOSIS — Z91040 Latex allergy status: Secondary | ICD-10-CM | POA: Diagnosis not present

## 2016-09-27 DIAGNOSIS — K219 Gastro-esophageal reflux disease without esophagitis: Secondary | ICD-10-CM | POA: Insufficient documentation

## 2016-09-27 DIAGNOSIS — E876 Hypokalemia: Secondary | ICD-10-CM | POA: Diagnosis not present

## 2016-09-27 DIAGNOSIS — D61810 Antineoplastic chemotherapy induced pancytopenia: Secondary | ICD-10-CM | POA: Diagnosis not present

## 2016-09-27 DIAGNOSIS — K449 Diaphragmatic hernia without obstruction or gangrene: Secondary | ICD-10-CM | POA: Insufficient documentation

## 2016-09-27 DIAGNOSIS — K222 Esophageal obstruction: Secondary | ICD-10-CM | POA: Insufficient documentation

## 2016-09-27 DIAGNOSIS — M069 Rheumatoid arthritis, unspecified: Secondary | ICD-10-CM | POA: Insufficient documentation

## 2016-09-27 DIAGNOSIS — I110 Hypertensive heart disease with heart failure: Secondary | ICD-10-CM | POA: Insufficient documentation

## 2016-09-27 DIAGNOSIS — K521 Toxic gastroenteritis and colitis: Secondary | ICD-10-CM | POA: Insufficient documentation

## 2016-09-27 DIAGNOSIS — C569 Malignant neoplasm of unspecified ovary: Secondary | ICD-10-CM | POA: Diagnosis not present

## 2016-09-27 DIAGNOSIS — Z794 Long term (current) use of insulin: Secondary | ICD-10-CM | POA: Insufficient documentation

## 2016-09-27 DIAGNOSIS — Z9641 Presence of insulin pump (external) (internal): Secondary | ICD-10-CM | POA: Insufficient documentation

## 2016-09-27 DIAGNOSIS — I252 Old myocardial infarction: Secondary | ICD-10-CM | POA: Diagnosis not present

## 2016-09-27 DIAGNOSIS — T451X5A Adverse effect of antineoplastic and immunosuppressive drugs, initial encounter: Secondary | ICD-10-CM | POA: Insufficient documentation

## 2016-09-27 DIAGNOSIS — D61818 Other pancytopenia: Secondary | ICD-10-CM | POA: Diagnosis present

## 2016-09-27 DIAGNOSIS — E785 Hyperlipidemia, unspecified: Secondary | ICD-10-CM | POA: Diagnosis not present

## 2016-09-27 DIAGNOSIS — M359 Systemic involvement of connective tissue, unspecified: Secondary | ICD-10-CM | POA: Diagnosis not present

## 2016-09-27 DIAGNOSIS — Z87891 Personal history of nicotine dependence: Secondary | ICD-10-CM | POA: Diagnosis not present

## 2016-09-27 DIAGNOSIS — Z885 Allergy status to narcotic agent status: Secondary | ICD-10-CM | POA: Diagnosis not present

## 2016-09-27 DIAGNOSIS — I5032 Chronic diastolic (congestive) heart failure: Secondary | ICD-10-CM | POA: Insufficient documentation

## 2016-09-27 DIAGNOSIS — Z86718 Personal history of other venous thrombosis and embolism: Secondary | ICD-10-CM | POA: Insufficient documentation

## 2016-09-27 DIAGNOSIS — C801 Malignant (primary) neoplasm, unspecified: Secondary | ICD-10-CM

## 2016-09-27 DIAGNOSIS — I2510 Atherosclerotic heart disease of native coronary artery without angina pectoris: Secondary | ICD-10-CM | POA: Insufficient documentation

## 2016-09-27 DIAGNOSIS — Z951 Presence of aortocoronary bypass graft: Secondary | ICD-10-CM | POA: Diagnosis not present

## 2016-09-27 DIAGNOSIS — E119 Type 2 diabetes mellitus without complications: Secondary | ICD-10-CM | POA: Insufficient documentation

## 2016-09-27 DIAGNOSIS — C786 Secondary malignant neoplasm of retroperitoneum and peritoneum: Secondary | ICD-10-CM

## 2016-09-27 LAB — GLUCOSE, CAPILLARY
GLUCOSE-CAPILLARY: 228 mg/dL — AB (ref 65–99)
Glucose-Capillary: 229 mg/dL — ABNORMAL HIGH (ref 65–99)

## 2016-09-27 LAB — CBC WITH DIFFERENTIAL/PLATELET
Basophils Absolute: 0 10*3/uL (ref 0–0.1)
Basophils Relative: 1 %
Eosinophils Absolute: 0 10*3/uL (ref 0–0.7)
Eosinophils Relative: 1 %
HCT: 20.3 % — ABNORMAL LOW (ref 35.0–47.0)
Hemoglobin: 7 g/dL — ABNORMAL LOW (ref 12.0–16.0)
Lymphocytes Relative: 49 %
Lymphs Abs: 0.2 10*3/uL — ABNORMAL LOW (ref 1.0–3.6)
MCH: 30.3 pg (ref 26.0–34.0)
MCHC: 34.6 g/dL (ref 32.0–36.0)
MCV: 87.6 fL (ref 80.0–100.0)
Monocytes Absolute: 0.1 10*3/uL — ABNORMAL LOW (ref 0.2–0.9)
Monocytes Relative: 15 %
Neutro Abs: 0.2 10*3/uL — ABNORMAL LOW (ref 1.7–7.7)
Neutrophils Relative %: 34 %
Platelets: 97 10*3/uL — ABNORMAL LOW (ref 150–440)
RBC: 2.32 MIL/uL — ABNORMAL LOW (ref 3.80–5.20)
RDW: 16.3 % — ABNORMAL HIGH (ref 11.5–14.5)
WBC: 0.5 10*3/uL — CL (ref 3.6–11.0)

## 2016-09-27 LAB — MAGNESIUM: Magnesium: 1.4 mg/dL — ABNORMAL LOW (ref 1.7–2.4)

## 2016-09-27 LAB — BASIC METABOLIC PANEL
Anion gap: 7 (ref 5–15)
BUN: 27 mg/dL — ABNORMAL HIGH (ref 6–20)
CO2: 25 mmol/L (ref 22–32)
Calcium: 8.8 mg/dL — ABNORMAL LOW (ref 8.9–10.3)
Chloride: 99 mmol/L — ABNORMAL LOW (ref 101–111)
Creatinine, Ser: 0.64 mg/dL (ref 0.44–1.00)
GFR calc Af Amer: >60 mL/min (ref >60)
GFR calc non Af Amer: >60 mL/min (ref >60)
Glucose, Bld: 296 mg/dL — ABNORMAL HIGH (ref 65–99)
Potassium: 4.4 mmol/L (ref 3.5–5.1)
Sodium: 131 mmol/L — ABNORMAL LOW (ref 135–145)

## 2016-09-27 LAB — OCCULT BLOOD X 1 CARD TO LAB, STOOL: Fecal Occult Bld: NEGATIVE

## 2016-09-27 LAB — PREPARE RBC (CROSSMATCH)

## 2016-09-27 LAB — SAMPLE TO BLOOD BANK

## 2016-09-27 MED ORDER — MAGNESIUM SULFATE 2 GM/50ML IV SOLN
2.0000 g | Freq: Once | INTRAVENOUS | Status: AC
  Administered 2016-09-27: 2 g via INTRAVENOUS
  Filled 2016-09-27: qty 50

## 2016-09-27 MED ORDER — FUROSEMIDE 40 MG PO TABS
40.0000 mg | ORAL_TABLET | Freq: Two times a day (BID) | ORAL | Status: DC
  Filled 2016-09-27: qty 1

## 2016-09-27 MED ORDER — ACETAMINOPHEN 650 MG RE SUPP: 650.0000 mg | Freq: Four times a day (QID) | RECTAL | Status: DC | PRN

## 2016-09-27 MED ORDER — LOPERAMIDE HCL 2 MG PO CAPS: 2.0000 mg | ORAL_CAPSULE | Freq: Four times a day (QID) | ORAL | Status: DC | PRN

## 2016-09-27 MED ORDER — POLYETHYLENE GLYCOL 3350 17 G PO PACK: 17.0000 g | PACK | Freq: Every day | ORAL | Status: DC | PRN

## 2016-09-27 MED ORDER — INSULIN PUMP
Freq: Three times a day (TID) | SUBCUTANEOUS | Status: DC
  Filled 2016-09-27: qty 1

## 2016-09-27 MED ORDER — LOSARTAN POTASSIUM 50 MG PO TABS
25.0000 mg | ORAL_TABLET | Freq: Every day | ORAL | Status: DC
  Filled 2016-09-27: qty 1

## 2016-09-27 MED ORDER — SODIUM CHLORIDE 0.9 % IV SOLN
Freq: Once | INTRAVENOUS | Status: AC
  Administered 2016-09-27: 20:00:00 via INTRAVENOUS

## 2016-09-27 MED ORDER — METOPROLOL TARTRATE 25 MG PO TABS
25.0000 mg | ORAL_TABLET | Freq: Two times a day (BID) | ORAL | Status: DC
  Administered 2016-09-27 – 2016-09-28 (×2): 25 mg via ORAL
  Filled 2016-09-27 (×2): qty 1

## 2016-09-27 MED ORDER — ONDANSETRON HCL 4 MG PO TABS: 4.0000 mg | ORAL_TABLET | Freq: Four times a day (QID) | ORAL | Status: DC | PRN

## 2016-09-27 MED ORDER — ACETAMINOPHEN 325 MG PO TABS: 650.0000 mg | ORAL_TABLET | Freq: Four times a day (QID) | ORAL | Status: DC | PRN

## 2016-09-27 MED ORDER — SODIUM CHLORIDE 0.9% FLUSH
10.0000 mL | Freq: Two times a day (BID) | INTRAVENOUS | Status: DC
  Administered 2016-09-27: 22:00:00 20 mL
  Administered 2016-09-28: 10 mL

## 2016-09-27 MED ORDER — ALBUTEROL SULFATE (2.5 MG/3ML) 0.083% IN NEBU: 2.5000 mg | INHALATION_SOLUTION | RESPIRATORY_TRACT | Status: DC | PRN

## 2016-09-27 MED ORDER — PREDNISONE 5 MG PO TABS
5.0000 mg | ORAL_TABLET | Freq: Every day | ORAL | Status: DC
  Administered 2016-09-28: 09:00:00 5 mg via ORAL
  Filled 2016-09-27: qty 1

## 2016-09-27 MED ORDER — POTASSIUM CHLORIDE CRYS ER 20 MEQ PO TBCR
20.0000 meq | EXTENDED_RELEASE_TABLET | Freq: Every day | ORAL | Status: DC
  Administered 2016-09-27 – 2016-09-28 (×2): 20 meq via ORAL
  Filled 2016-09-27 (×2): qty 1

## 2016-09-27 MED ORDER — SODIUM CHLORIDE 0.9 % IV SOLN
INTRAVENOUS | Status: DC
  Administered 2016-09-27: 14:00:00 via INTRAVENOUS
  Filled 2016-09-27: qty 1000

## 2016-09-27 MED ORDER — FOLIC ACID 1 MG PO TABS
1.0000 mg | ORAL_TABLET | Freq: Every day | ORAL | Status: DC
Start: 2016-09-27 — End: 2016-09-28
  Administered 2016-09-27: 1 mg via ORAL
  Filled 2016-09-27 (×2): qty 1

## 2016-09-27 MED ORDER — PANTOPRAZOLE SODIUM 40 MG PO TBEC
40.0000 mg | DELAYED_RELEASE_TABLET | Freq: Two times a day (BID) | ORAL | Status: DC
  Administered 2016-09-27 – 2016-09-28 (×2): 40 mg via ORAL
  Filled 2016-09-27 (×2): qty 1

## 2016-09-27 MED ORDER — INSULIN PUMP
Freq: Three times a day (TID) | SUBCUTANEOUS | Status: DC
  Administered 2016-09-27: 19:00:00 7.6 via SUBCUTANEOUS
  Administered 2016-09-28 (×2): via SUBCUTANEOUS
  Filled 2016-09-27: qty 1

## 2016-09-27 MED ORDER — SODIUM CHLORIDE 0.9% FLUSH
10.0000 mL | INTRAVENOUS | Status: DC | PRN
  Administered 2016-09-28: 15:00:00 10 mL
  Filled 2016-09-27: qty 40

## 2016-09-27 MED ORDER — ONDANSETRON HCL 4 MG/2ML IJ SOLN: 4.0000 mg | Freq: Four times a day (QID) | INTRAMUSCULAR | Status: DC | PRN

## 2016-09-27 MED ORDER — HEPARIN SOD (PORK) LOCK FLUSH 100 UNIT/ML IV SOLN: 500.0000 [IU] | Freq: Once | INTRAVENOUS | Status: DC

## 2016-09-27 MED ORDER — MAGNESIUM OXIDE 400 (241.3 MG) MG PO TABS
400.0000 mg | ORAL_TABLET | Freq: Every day | ORAL | Status: DC
  Administered 2016-09-27 – 2016-09-28 (×2): 400 mg via ORAL
  Filled 2016-09-27 (×2): qty 1

## 2016-09-27 MED ORDER — SODIUM CHLORIDE 0.9% FLUSH
10.0000 mL | INTRAVENOUS | Status: DC | PRN
  Administered 2016-09-27: 10 mL via INTRAVENOUS
  Filled 2016-09-27: qty 10

## 2016-09-27 NOTE — Progress Notes (Signed)
Magnesium 2 gm per Dr. Mike Gip today. LJ  1710 - patient left to be admitted to the hospital, port still  accessed. LJ

## 2016-09-27 NOTE — Telephone Encounter (Signed)
Patient being admitted to [facility] for observation regarding symptomatic anemia and diarrhea.  Called report to [place].  Called orderly to transport from infusion area to room 115.

## 2016-09-27 NOTE — H&P (Signed)
[facility] NAME: [name]    MR#:  [id number]  DATE OF BIRTH:  17-Sep-1945  DATE OF ADMISSION:  09/27/2016  PRIMARY CARE PHYSICIAN: Leone Haven, MD   REQUESTING/REFERRING PHYSICIAN: Dr. Derwood Kaplan  CHIEF COMPLAINT:  No chief complaint on file.  Pancytopenia with black stools  HISTORY OF PRESENT ILLNESS:  [name]  is a 71 y.o. female with a known history of Diabetes mellitus, hypertension, diastolic CHF, ovarian cancer undergoing chemotherapy and has colostomy bag presented to the [facility] for magnesium infusion which she gets routinely. Patient has had on and off diarrhea since her chemotherapy. Today she was found to have black stools after she took Pepto-Bismol. Stool was checked for blood and negative. On routine blood work she has been found to be pancytopenic with hemoglobin of 7 and is admitted to the hospital for blood transfusion and monitoring. He received Neulasta earlier today. She does take Lovenox for history of DVT at home which has been held by Dr. Mike Gip. No dizziness or shortness of breath or chest pain. No vomiting. No abdominal pain. Complains of gurgling in her stomach with the diarrhea.  PAST MEDICAL HISTORY:   Past Medical History:  Diagnosis Date  . CAD (coronary artery disease)    a. 09/2012 Cath: LM nl, LAD 95p, 32m LCX 927mOM2 50, RCA 100.  . Marland Kitchenholelithiasis   . Chronic diastolic CHF (congestive heart failure) ([facility]  . Collagen vascular disease ([place]  . Esophageal stricture   . Exertional shortness of breath   . GERD (gastroesophageal reflux disease)   . H/O hiatal hernia   . Herniated disc   . History of pancytopenia   . Hyperlipidemia   . Hypertension   . Hypokalemia   . Leukopenia 2012   s/p bone marrow biopsy, Dr. PaMa Hillock. NSTEMI (non-ST elevated myocardial infarction) (HCDane5/2014   "mild" (10/18/2012)  . Ovarian cancer ([facility] [place]2016   chemo  . Pneumonia 2013; 08/2012    "one lung; double" (10/18/2012)  . Rheumatoid arthritis(714.0)   . Type I diabetes mellitus (HCM[place]   "dx'd in 1957" (10/18/2012)    PAST SURGICAL HISTORY:   Past Surgical History:  Procedure Laterality Date  . ABDOMINAL HYSTERECTOMY  06/15/2015   Dx L/S, EXLAP TAH BSO omentectomy RSRx colostomy diaphragm resection stripping  . CARDIAC CATHETERIZATION  10/18/2012   "first one was today" (10/18/2012)  . CATARACT EXTRACTION W/ INTRAOCULAR LENS IMPLANT Right 2010  . CHOLECYSTECTOMY  06/15/2015   combined case with ovarian cancer debulking  . COLON SURGERY    . CORONARY ARTERY BYPASS GRAFT N/A 10/19/2012   Procedure: CORONARY ARTERY BYPASS GRAFTING (CABG);  Surgeon: StMelrose NakayamaMD;  [place] [facility] Service: Open [facility] Surgery;  Laterality: N/A;  . ESOPHAGEAL DILATION     "3 or 4 times" (10/19/2011)  . ESOPHAGOGASTRODUODENOSCOPY  2012   Dr. IfMinna Merritts. OSTOMY    . OVARY SURGERY     removal  . PERIPHERAL VASCULAR CATHETERIZATION N/A 03/02/2015   Procedure: PoGlori Luisath Insertion;  Surgeon: JaAlgernon HuxleyMD;  [facility];  Service: Cardiovascular;  Laterality: N/A;  . TUBAL LIGATION  1970    SOCIAL HISTORY:   Social History  Substance Use Topics  . Smoking status: Former Smoker    Packs/day: 1.00    Years: 30.00    Types: Cigarettes    Quit date: 06/11/1994  . Smokeless tobacco:  Never Used  . Alcohol use No    FAMILY HISTORY:   Family History  Problem Relation Age of Onset  . Diabetes Mother   . Arthritis Mother   . Diabetes Father   . Arthritis Father   . Bone cancer Sister     DRUG ALLERGIES:   Allergies  Allergen Reactions  . Codeine Nausea And Vomiting  . Latex Rash    REVIEW OF SYSTEMS:   Review of Systems  Constitutional: Positive for malaise/fatigue. Negative for chills, fever and weight loss.  HENT: Negative for hearing loss and nosebleeds.   Eyes: Negative for blurred vision, double vision and pain.  Respiratory: Negative for  cough, hemoptysis, sputum production, shortness of breath and wheezing.   Cardiovascular: Negative for chest pain, palpitations, orthopnea and leg swelling.  Gastrointestinal: Positive for diarrhea and melena. Negative for abdominal pain, constipation, nausea and vomiting.  Genitourinary: Negative for dysuria and hematuria.  Musculoskeletal: Negative for back pain, falls and myalgias.  Skin: Negative for rash.  Neurological: Negative for dizziness, tremors, sensory change, speech change, focal weakness, seizures and headaches.  Endo/Heme/Allergies: Does not bruise/bleed easily.  Psychiatric/Behavioral: Negative for depression and memory loss. The patient is not nervous/anxious.     MEDICATIONS AT HOME:   Prior to Admission medications   Medication Sig Start Date End Date Taking? Authorizing Provider  Calcium-Vitamin D 600-200 MG-UNIT tablet Take 1 tablet by mouth 2 (two) times daily.     [provider]  cholecalciferol (VITAMIN D) 1000 units tablet Take 1,000 Units by mouth daily.    [provider]  ciprofloxacin (CIPRO) 500 MG tablet Take 1 tablet (500 mg total) by mouth 2 (two) times daily. 09/12/16   Lequita Asal, MD  enoxaparin (LOVENOX) 60 MG/0.6ML injection INJECT 0.6 ML (60 MG TOTAL) INTO THE SKIN EVERY 12 HOURS 07/22/16   Lequita Asal, MD  folic acid (FOLVITE) 1 MG tablet Take 1 tablet (1 mg total) by mouth daily. 09/10/16   Leone Haven, MD  furosemide (LASIX) 80 MG tablet Take 0.5 tablets by mouth 2 (two) times daily. 07/22/16   [provider]  insulin lispro (HUMALOG) 100 UNIT/ML injection Inject 0.06 mLs (6 Units total) into the skin daily. Patient taking differently: Inject 0-85 Units into the skin daily. Via insulin pump 11/20/15   Jackolyn Confer, MD  lidocaine-prilocaine (EMLA) cream APPLY 1 APPLICATION TOPICALLY AS NEEDED 1 HOUR PRIOR TO TREATMENT. COVER WITH PRESS N SEAL UNTIL TREATMENT TIME AS DIRECTED 06/15/16   Lequita Asal, MD  loratadine (CLARITIN) 10 MG tablet Take 1 tablet by mouth daily as needed.    [provider]  losartan (COZAAR) 25 MG tablet Take 1 tablet (25 mg total) by mouth daily. Patient not taking: Reported on 09/19/2016 08/02/16   Minna Merritts, MD  magnesium oxide (MAG-OX) 400 (241.3 Mg) MG tablet TAKE 1 TABLET BY MOUTH ONCE DAILY 08/03/16   Lequita Asal, MD  magnesium oxide (MAG-OX) 400 (241.3 Mg) MG tablet TAKE 1 TABLET BY MOUTH ONCE DAILY 09/06/16   Lequita Asal, MD  metoprolol tartrate (LOPRESSOR) 25 MG tablet Take 1 tablet (25 mg total) by mouth 2 (two) times daily. 04/12/16   Minna Merritts, MD  ondansetron (ZOFRAN) 8 MG tablet TAKE 1 TABLET BY MOUTH EVERY 8 HOURS AS NEEDED FOR NAUSEA OR VOMITING 03/23/16   Lequita Asal, MD  ONE TOUCH ULTRA TEST test strip  11/09/15   [provider]  pantoprazole (Rotan)  40 MG tablet Take 1 tablet (40 mg total) by mouth 2 (two) times daily. 08/03/16   Leone Haven, MD  potassium chloride SA (K-DUR,KLOR-CON) 20 MEQ tablet Take 1 tablet (20 mEq total) by mouth daily. 08/22/16   Lequita Asal, MD  predniSONE (DELTASONE) 5 MG tablet Take 5 mg by mouth daily with breakfast.    [provider]  ranitidine (ZANTAC) 150 MG tablet Take 1 tablet (150 mg total) by mouth 2 (two) times daily. 04/13/16   Leone Haven, MD  vitamin C (ASCORBIC ACID) 500 MG tablet Take 500 mg by mouth daily.    [provider]  zinc gluconate 50 MG tablet Take 50 mg by mouth daily.    [provider]     VITAL SIGNS:  Blood pressure (!) 141/52, pulse 84, temperature 98 F (36.7 C), temperature source Oral, resp. rate 18, SpO2 100 %.  PHYSICAL EXAMINATION:  Physical Exam  GENERAL:  71 y.o.-year-old patient lying in the bed with no acute distress.  EYES: Pupils equal, round, reactive to light and accommodation. No scleral icterus. Extraocular muscles intact. Pallor present HEENT: Head  atraumatic, normocephalic. Oropharynx and nasopharynx clear. No oropharyngeal erythema, moist oral mucosa  NECK:  Supple, no jugular venous distention. No thyroid enlargement, no tenderness.  LUNGS: Normal breath sounds bilaterally, no wheezing, rales, rhonchi. No use of accessory muscles of respiration.  CARDIOVASCULAR: S1, S2 normal. No murmurs, rubs, or gallops.  ABDOMEN: Soft, nontender, nondistended. Bowel sounds present. No organomegaly or mass. Colostomy bag in place with loose stools EXTREMITIES: No pedal edema, cyanosis, or clubbing. + 2 pedal & radial pulses b/l.   NEUROLOGIC: Cranial nerves II through XII are intact. No focal Motor or sensory deficits appreciated b/l PSYCHIATRIC: The patient is alert and oriented x 3. Good affect.  SKIN: No obvious rash, lesion, or ulcer.   LABORATORY PANEL:   CBC  Recent Labs Lab 09/27/16 1351  WBC 0.5*  HGB 7.0*  HCT 20.3*  PLT 97*   ------------------------------------------------------------------------------------------------------------------  Chemistries   Recent Labs Lab 09/27/16 1319 09/27/16 1320  NA  --  131*  K  --  4.4  CL  --  99*  CO2  --  25  GLUCOSE  --  296*  BUN  --  27*  CREATININE  --  0.64  CALCIUM  --  8.8*  MG 1.4*  --    ------------------------------------------------------------------------------------------------------------------  Cardiac Enzymes No results for input(s): TROPONINI in the last 168 hours. ------------------------------------------------------------------------------------------------------------------  RADIOLOGY:  No results found.   IMPRESSION AND PLAN:   * Pancytopenia Due to ongoing chemotherapy for ovarian cancer. We'll transfuse 1 unit packed RBC after type and screen. Patient did receive Neulasta at the cancer center. No transfusion of platelets needed. Discussed with Dr. Mike Gip of oncology. We will hold Lovenox at patient takes at home. Data hemoglobin after  transfusion. Consult oncology. Stool checked for blood and negative. Patient does have black stools. Pepto-Bismol that she took yesterday.  * Ovarian cancer on chemotherapy. Follow-up at cancer center. Oncology consulted.  * Diabetes mellitus on insulin pump. Orders placed. Continue insulin pump as patient does at home. Diabetic diet.  * Chronic diastolic CHF. Monitor for fluid overload.  * Diarrhea since patient started chemotherapy. Will add Imodium.  * DVT prophylaxis with SCDs   All the records are reviewed and case discussed with ED provider. Management plans discussed with the patient, family and they are in agreement.  CODE STATUS: DNR  TOTAL TIME  TAKING CARE OF THIS PATIENT: 40 minutes.   Hillary Bow R M.D on 09/27/2016 at 5:48 PM  Between 7am to 6pm - Pager - [phone]  After 6pm go to [email or web address] - password [email or web address] [facility]  Office  [phone]  CC: Primary care physician; Leone Haven, MD  Note: This dictation was prepared with Dragon dictation along with smaller phrase technology. Any transcriptional errors that result from this process are unintentional.

## 2016-09-28 ENCOUNTER — Inpatient Hospital Stay: Payer: Medicare Other

## 2016-09-28 DIAGNOSIS — D61810 Antineoplastic chemotherapy induced pancytopenia: Secondary | ICD-10-CM | POA: Diagnosis not present

## 2016-09-28 DIAGNOSIS — R109 Unspecified abdominal pain: Secondary | ICD-10-CM | POA: Diagnosis not present

## 2016-09-28 DIAGNOSIS — K222 Esophageal obstruction: Secondary | ICD-10-CM | POA: Diagnosis not present

## 2016-09-28 DIAGNOSIS — Z87891 Personal history of nicotine dependence: Secondary | ICD-10-CM

## 2016-09-28 DIAGNOSIS — K8020 Calculus of gallbladder without cholecystitis without obstruction: Secondary | ICD-10-CM | POA: Diagnosis not present

## 2016-09-28 DIAGNOSIS — I10 Essential (primary) hypertension: Secondary | ICD-10-CM

## 2016-09-28 DIAGNOSIS — M069 Rheumatoid arthritis, unspecified: Secondary | ICD-10-CM

## 2016-09-28 DIAGNOSIS — E876 Hypokalemia: Secondary | ICD-10-CM

## 2016-09-28 DIAGNOSIS — I998 Other disorder of circulatory system: Secondary | ICD-10-CM

## 2016-09-28 DIAGNOSIS — K219 Gastro-esophageal reflux disease without esophagitis: Secondary | ICD-10-CM

## 2016-09-28 DIAGNOSIS — Z808 Family history of malignant neoplasm of other organs or systems: Secondary | ICD-10-CM

## 2016-09-28 DIAGNOSIS — D72819 Decreased white blood cell count, unspecified: Secondary | ICD-10-CM

## 2016-09-28 DIAGNOSIS — I5032 Chronic diastolic (congestive) heart failure: Secondary | ICD-10-CM | POA: Diagnosis not present

## 2016-09-28 DIAGNOSIS — Z7901 Long term (current) use of anticoagulants: Secondary | ICD-10-CM

## 2016-09-28 DIAGNOSIS — D709 Neutropenia, unspecified: Secondary | ICD-10-CM

## 2016-09-28 DIAGNOSIS — R0602 Shortness of breath: Secondary | ICD-10-CM | POA: Diagnosis not present

## 2016-09-28 DIAGNOSIS — I252 Old myocardial infarction: Secondary | ICD-10-CM

## 2016-09-28 DIAGNOSIS — E109 Type 1 diabetes mellitus without complications: Secondary | ICD-10-CM

## 2016-09-28 DIAGNOSIS — E785 Hyperlipidemia, unspecified: Secondary | ICD-10-CM

## 2016-09-28 DIAGNOSIS — D649 Anemia, unspecified: Secondary | ICD-10-CM | POA: Diagnosis not present

## 2016-09-28 DIAGNOSIS — K449 Diaphragmatic hernia without obstruction or gangrene: Secondary | ICD-10-CM

## 2016-09-28 DIAGNOSIS — R197 Diarrhea, unspecified: Secondary | ICD-10-CM | POA: Diagnosis not present

## 2016-09-28 DIAGNOSIS — Z7952 Long term (current) use of systemic steroids: Secondary | ICD-10-CM

## 2016-09-28 DIAGNOSIS — C539 Malignant neoplasm of cervix uteri, unspecified: Secondary | ICD-10-CM

## 2016-09-28 DIAGNOSIS — I2510 Atherosclerotic heart disease of native coronary artery without angina pectoris: Secondary | ICD-10-CM

## 2016-09-28 DIAGNOSIS — Z79899 Other long term (current) drug therapy: Secondary | ICD-10-CM

## 2016-09-28 DIAGNOSIS — Z8701 Personal history of pneumonia (recurrent): Secondary | ICD-10-CM

## 2016-09-28 DIAGNOSIS — Z794 Long term (current) use of insulin: Secondary | ICD-10-CM

## 2016-09-28 LAB — GASTROINTESTINAL PANEL BY PCR, STOOL (REPLACES STOOL CULTURE)

## 2016-09-28 LAB — CBC
HEMATOCRIT: 25.1 % — AB (ref 35.0–47.0)
Hemoglobin: 8.5 g/dL — ABNORMAL LOW (ref 12.0–16.0)
MCH: 29.4 pg (ref 26.0–34.0)
MCHC: 34 g/dL (ref 32.0–36.0)
MCV: 86.5 fL (ref 80.0–100.0)
Platelets: 82 10*3/uL — ABNORMAL LOW (ref 150–440)
RBC: 2.9 MIL/uL — ABNORMAL LOW (ref 3.80–5.20)
RDW: 16.3 % — AB (ref 11.5–14.5)
WBC: 0.8 10*3/uL — CL (ref 3.6–11.0)

## 2016-09-28 LAB — TYPE AND SCREEN
ABO/RH(D): A POS
Antibody Screen: NEGATIVE
Unit division: 0

## 2016-09-28 LAB — BASIC METABOLIC PANEL
Anion gap: 5 (ref 5–15)
BUN: 18 mg/dL (ref 6–20)
CO2: 28 mmol/L (ref 22–32)
Calcium: 8.8 mg/dL — ABNORMAL LOW (ref 8.9–10.3)
Chloride: 103 mmol/L (ref 101–111)
Creatinine, Ser: 0.62 mg/dL (ref 0.44–1.00)
GFR calc Af Amer: >60 mL/min (ref >60)
Glucose, Bld: 141 mg/dL — ABNORMAL HIGH (ref 65–99)
POTASSIUM: 4.1 mmol/L (ref 3.5–5.1)
Sodium: 136 mmol/L (ref 135–145)

## 2016-09-28 LAB — GLUCOSE, CAPILLARY
GLUCOSE-CAPILLARY: 146 mg/dL — AB (ref 65–99)
GLUCOSE-CAPILLARY: 139 mg/dL — AB (ref 65–99)
Glucose-Capillary: 128 mg/dL — ABNORMAL HIGH (ref 65–99)

## 2016-09-28 LAB — BPAM RBC
BLOOD PRODUCT EXPIRATION DATE: 201806012359
ISSUE DATE / TIME: 201805292004
UNIT TYPE AND RH: 6200

## 2016-09-28 LAB — C DIFFICILE QUICK SCREEN W PCR REFLEX
C Diff antigen: NEGATIVE
C Diff interpretation: NOT DETECTED
C Diff toxin: NEGATIVE

## 2016-09-28 LAB — HEMOGLOBIN AND HEMATOCRIT, BLOOD
HEMATOCRIT: 25.1 % — AB (ref 35.0–47.0)
HEMOGLOBIN: 8.6 g/dL — AB (ref 12.0–16.0)

## 2016-09-28 LAB — OCCULT BLOOD X 1 CARD TO LAB, STOOL: Fecal Occult Bld: NEGATIVE

## 2016-09-28 MED ORDER — TRAZODONE HCL 50 MG PO TABS: 50.0000 mg | ORAL_TABLET | Freq: Every evening | ORAL | Status: DC | PRN

## 2016-09-28 MED ORDER — HEPARIN SOD (PORK) LOCK FLUSH 100 UNIT/ML IV SOLN
500.0000 [IU] | INTRAVENOUS | Status: AC | PRN
  Administered 2016-09-28: 500 [IU]
  Filled 2016-09-28: qty 5

## 2016-09-28 NOTE — Care Management (Signed)
Admitted to this facility under observation status with the diagnosis of pantopenia.. Lives with husband, Suezanne Jacquet. [phone]). Last seen Dr, Wynelle Cleveland a month ago. Sees Dr. Janece Canterbury for ovarian cancer. Insulin pump in place x 15 years. Colostomy in place since Feb. 2017. Prescriptions are filled at [facility]. Home health last summer, Nurse came to the home 2 x week. Doesn't remember name of company. "They were out of [facility] No skilled facility. No home oxygen. Rolling walker and cane, if needed. Last chemotherapy was Friday of last week for ovarian cancer. No falls. Good appetite. Takes care of all basic activities of daily living, doesn't drive. Husband does errands. Husband will transport [place] MSN [facility] Care Management [phone]

## 2016-09-28 NOTE — Discharge Summary (Addendum)
[facility] NAME: [name]    MR#:  [id number]  DATE OF BIRTH:  1946-01-18  DATE OF ADMISSION:  09/27/2016 ADMITTING PHYSICIAN: Hillary Bow, MD  DATE OF DISCHARGE: 09/28/2016  PRIMARY CARE PHYSICIAN: Leone Haven, MD    ADMISSION DIAGNOSIS:  anemia diarrhea  DISCHARGE DIAGNOSIS:  Active Problems:   Pancytopenia ([place])   SECONDARY DIAGNOSIS:   Past Medical History:  Diagnosis Date  . CAD (coronary artery disease)    a. 09/2012 Cath: LM nl, LAD 95p, 45m LCX 945mOM2 50, RCA 100.  . Marland Kitchenholelithiasis   . Chronic diastolic CHF (congestive heart failure) (HCD[place]  . Collagen vascular disease ([place]  . Esophageal stricture   . Exertional shortness of breath   . GERD (gastroesophageal reflux disease)   . H/O hiatal hernia   . Herniated disc   . History of pancytopenia   . Hyperlipidemia   . Hypertension   . Hypokalemia   . Leukopenia 2012   s/p bone marrow biopsy, Dr. PaMa Hillock. NSTEMI (non-ST elevated myocardial infarction) ([facility]5/2014   "mild" (10/18/2012)  . Ovarian cancer (HCE[place]anza2016   chemo  . Pneumonia 2013; 08/2012   "one lung; double" (10/18/2012)  . Rheumatoid arthritis(714.0)   . Type I diabetes mellitus (HCC[place]zet   "dx'd in 1957" (10/18/2012)    HOSPITAL COURSE:   * Pancytopenia Due to ongoing chemotherapy for ovarian cancer.  transfused 1 unit packed RBC - Hb 7  >> 8.5. Patient did receive Neulasta at the cancer center. No transfusion of platelets needed. Discussed with Dr. CoMike Gipf oncology.  hold Lovenox at patient takes at home. Stable hemoglobin after transfusion. Consulted oncology. Stool checked for blood and negative. Patient does have black stools. Pepto-Bismol that she took yesterday.  as per oncology - stable to d/c and follow up in office.  * Ovarian cancer on chemotherapy. Follow-up at cancer center. Oncology consulted.  * Diabetes mellitus on insulin pump. Orders placed.  Continue insulin pump as patient does at home. Diabetic diet.  * Chronic diastolic CHF. Monitor for fluid overload.  * Diarrhea since patient started chemotherapy. Will add Imodium.  * DVT prophylaxis with SCDs   DISCHARGE CONDITIONS:   Stable.  CONSULTS OBTAINED:  Treatment Team:  RaSindy GuadeloupeMD  DRUG ALLERGIES:   Allergies  Allergen Reactions  . Codeine Nausea And Vomiting  . Latex Rash    DISCHARGE MEDICATIONS:   Current Discharge Medication List    CONTINUE these medications which have NOT CHANGED   Details  Calcium-Vitamin D 600-200 MG-UNIT tablet Take 1 tablet by mouth 2 (two) times daily.    Associated Diagnoses: Thrombosis of right internal jugular vein (HCHamm[place] Liver abscess; Ovarian cancer, unspecified laterality (HCMac[place] Peritoneal carcinomatosis (HCLis[place] Decubitus ulcer    cholecalciferol (VITAMIN D) 1000 units tablet Take 1,000 Units by mouth daily.    folic acid (FOLVITE) 1 MG tablet Take 1 tablet (1 mg total) by mouth daily. Qty: 90 tablet, Refills: 3    furosemide (LASIX) 80 MG tablet Take 0.5 tablets by mouth 2 (two) times daily.    insulin lispro (HUMALOG) 100 UNIT/ML injection Inject 0.06 mLs (6 Units total) into the skin daily. Qty: 30 mL, Refills: 3    lidocaine-prilocaine (EMLA) cream APPLY 1 APPLICATION TOPICALLY AS NEEDED 1 HOUR PRIOR TO TREATMENT. COVER WITH PRESS N SEAL UNTIL TREATMENT TIME AS DIRECTED Qty: 30 g, Refills: 1    loratadine (CLARITIN)  10 MG tablet Take 1 tablet by mouth daily as needed.   Associated Diagnoses: Ovarian cancer, unspecified laterality ([place]); Peritoneal carcinomatosis ([place]); Leukopenia    magnesium oxide (MAG-OX) 400 (241.3 Mg) MG tablet TAKE 1 TABLET BY MOUTH ONCE DAILY Qty: 30 tablet, Refills: 1    metoprolol tartrate (LOPRESSOR) 25 MG tablet Take 1 tablet (25 mg total) by mouth 2 (two) times daily. Qty: 180 tablet, Refills: 3    ondansetron (ZOFRAN) 8 MG tablet TAKE 1 TABLET BY MOUTH EVERY 8 HOURS AS  NEEDED FOR NAUSEA OR VOMITING Qty: 30 tablet, Refills: 1    pantoprazole (PROTONIX) 40 MG tablet Take 1 tablet (40 mg total) by mouth 2 (two) times daily. Qty: 180 tablet, Refills: 1    potassium chloride SA (K-DUR,KLOR-CON) 20 MEQ tablet Take 1 tablet (20 mEq total) by mouth daily. Qty: 30 tablet, Refills: 0   Associated Diagnoses: Hypomagnesemia; Malignant neoplasm of ovary, unspecified laterality ([place]); Peritoneal carcinomatosis ([place]); Goals of care, counseling/discussion; Encounter for antineoplastic chemotherapy; Anemia, unspecified type    predniSONE (DELTASONE) 5 MG tablet Take 5 mg by mouth daily with breakfast.    vitamin C (ASCORBIC ACID) 500 MG tablet Take 500 mg by mouth daily.    zinc gluconate 50 MG tablet Take 50 mg by mouth daily.      STOP taking these medications     enoxaparin (LOVENOX) 60 MG/0.6ML injection      ciprofloxacin (CIPRO) 500 MG tablet      losartan (COZAAR) 25 MG tablet      ONE TOUCH ULTRA TEST test strip      ranitidine (ZANTAC) 150 MG tablet          DISCHARGE INSTRUCTIONS:    Follow with cancer center in 1 week.  If you experience worsening of your admission symptoms, develop shortness of breath, life threatening emergency, suicidal or homicidal thoughts you must seek medical attention immediately by calling 911 or calling your MD immediately  if symptoms less severe.  You Must read complete instructions/literature along with all the possible adverse reactions/side effects for all the Medicines you take and that have been prescribed to you. Take any new Medicines after you have completely understood and accept all the possible adverse reactions/side effects.   Please note  You were cared for by a hospitalist during your hospital stay. If you have any questions about your discharge medications or the care you received while you were in the hospital after you are discharged, you can call the unit and asked to speak with the hospitalist on  call if the hospitalist that took care of you is not available. Once you are discharged, your primary care physician will handle any further medical issues. Please note that NO REFILLS for any discharge medications will be authorized once you are discharged, as it is imperative that you return to your primary care physician (or establish a relationship with a primary care physician if you do not have one) for your aftercare needs so that they can reassess your need for medications and monitor your lab values.    Today   CHIEF COMPLAINT:  No chief complaint on file.   HISTORY OF PRESENT ILLNESS:  [name]  is a 71 y.o. female with a known history of Diabetes mellitus, hypertension, diastolic CHF, ovarian cancer undergoing chemotherapy and has colostomy bag presented to the [facility] for magnesium infusion which she gets routinely. Patient has had on and off diarrhea since her chemotherapy. Today she was found to  have black stools after she took Pepto-Bismol. Stool was checked for blood and negative. On routine blood work she has been found to be pancytopenic with hemoglobin of 7 and is admitted to the hospital for blood transfusion and monitoring. He received Neulasta earlier today. She does take Lovenox for history of DVT at home which has been held by Dr. Mike Gip. No dizziness or shortness of breath or chest pain. No vomiting. No abdominal pain. Complains of gurgling in her stomach with the diarrhea.   VITAL SIGNS:  Blood pressure (!) 126/43, pulse 81, temperature 99 F (37.2 C), temperature source Oral, resp. rate 14, height 5' 3"  (1.6 m), weight 61.6 kg (135 lb 14.4 oz), SpO2 99 %.  I/O:    Intake/Output Summary (Last 24 hours) at 09/28/16 1448 Last data filed at 09/28/16 0930  Gross per 24 hour  Intake              566 ml  Output              200 ml  Net              366 ml    PHYSICAL EXAMINATION:   GENERAL:  71 y.o.-year-old patient lying in the bed with no acute  distress.  EYES: Pupils equal, round, reactive to light and accommodation. No scleral icterus. Extraocular muscles intact. Pallor present HEENT: Head atraumatic, normocephalic. Oropharynx and nasopharynx clear. No oropharyngeal erythema, moist oral mucosa  NECK:  Supple, no jugular venous distention. No thyroid enlargement, no tenderness.  LUNGS: Normal breath sounds bilaterally, no wheezing, rales, rhonchi. No use of accessory muscles of respiration.  CARDIOVASCULAR: S1, S2 normal. No murmurs, rubs, or gallops.  ABDOMEN: Soft, nontender, nondistended. Bowel sounds present. No organomegaly or mass. Colostomy bag in place with loose stools EXTREMITIES: No pedal edema, cyanosis, or clubbing. + 2 pedal & radial pulses b/l.   NEUROLOGIC: Cranial nerves II through XII are intact. No focal Motor or sensory deficits appreciated b/l PSYCHIATRIC: The patient is alert and oriented x 3. Good affect.  SKIN: No obvious rash, lesion, or ulcer  DATA REVIEW:   CBC  Recent Labs Lab 09/28/16 0633  WBC 0.8*  HGB 8.5*  HCT 25.1*  PLT 82*    Chemistries   Recent Labs Lab 09/27/16 1319  09/28/16 0633  NA  --   < > 136  K  --   < > 4.1  CL  --   < > 103  CO2  --   < > 28  GLUCOSE  --   < > 141*  BUN  --   < > 18  CREATININE  --   < > 0.62  CALCIUM  --   < > 8.8*  MG 1.4*  --   --   < > = values in this interval not displayed.  Cardiac Enzymes No results for input(s): TROPONINI in the last 168 hours.  Microbiology Results  Results for orders placed or performed during the hospital encounter of 09/27/16  Gastrointestinal Panel by PCR , Stool     Status: None   Collection Time: 09/28/16 10:00 AM  Result Value Ref Range Status   Campylobacter species NOT DETECTED NOT DETECTED Final   Plesimonas shigelloides NOT DETECTED NOT DETECTED Final   Salmonella species NOT DETECTED NOT DETECTED Final   Yersinia enterocolitica NOT DETECTED NOT DETECTED Final   Vibrio species NOT DETECTED NOT DETECTED  Final   Vibrio cholerae NOT DETECTED NOT DETECTED Final  Enteroaggregative E coli (EAEC) NOT DETECTED NOT DETECTED Final   Enteropathogenic E coli (EPEC) NOT DETECTED NOT DETECTED Final   Enterotoxigenic E coli (ETEC) NOT DETECTED NOT DETECTED Final   Shiga like toxin producing E coli (STEC) NOT DETECTED NOT DETECTED Final   Shigella/Enteroinvasive E coli (EIEC) NOT DETECTED NOT DETECTED Final   Cryptosporidium NOT DETECTED NOT DETECTED Final   Cyclospora cayetanensis NOT DETECTED NOT DETECTED Final   Entamoeba histolytica NOT DETECTED NOT DETECTED Final   Giardia lamblia NOT DETECTED NOT DETECTED Final   Adenovirus F40/41 NOT DETECTED NOT DETECTED Final   Astrovirus NOT DETECTED NOT DETECTED Final   Norovirus GI/GII NOT DETECTED NOT DETECTED Final   Rotavirus A NOT DETECTED NOT DETECTED Final   Sapovirus (I, II, IV, and V) NOT DETECTED NOT DETECTED Final  C difficile quick scan w PCR reflex     Status: None   Collection Time: 09/28/16 10:00 AM  Result Value Ref Range Status   C Diff antigen NEGATIVE NEGATIVE Final   C Diff toxin NEGATIVE NEGATIVE Final   C Diff interpretation No C. difficile detected.  Final    RADIOLOGY:  No results found.  EKG:   Orders placed or performed in visit on 04/12/16  . EKG 12-Lead      Management plans discussed with the patient, family and they are in agreement.  CODE STATUS:     Code Status Orders        Start     Ordered   09/27/16 4270  Do not attempt resuscitation (DNR)  Continuous    Question Answer Comment  In the event of cardiac or respiratory ARREST Do not call a "code blue"   In the event of cardiac or respiratory ARREST Do not perform Intubation, CPR, defibrillation or ACLS   In the event of cardiac or respiratory ARREST Use medication by any route, position, wound care, and other measures to relive pain and suffering. May use oxygen, suction and manual treatment of airway obstruction as needed for comfort.      09/27/16  1747    Code Status History    Date Active Date Inactive Code Status Order ID Comments User Context   05/26/2016  9:15 AM 05/28/2016  5:02 PM Full Code [id number]  Vaughan Basta, MD Inpatient   07/07/2015 10:49 PM 07/09/2015  8:26 PM Full Code [id number]  Lance Coon, MD Inpatient   07/01/2015  8:06 PM 07/03/2015  4:36 PM Full Code [id number]  Fritzi Mandes, MD Inpatient   03/02/2015 10:28 AM 03/03/2015 10:50 AM Full Code [id number]  Algernon Huxley, MD Inpatient   10/19/2012  3:03 PM 10/21/2012 12:23 PM Full Code [id number]  Nani Skillern, PA-C Inpatient    Advance Directive Documentation     Most Recent Value  Type of Advance Directive  Healthcare Power of Attorney, Living will  Pre-existing out of facility DNR order (yellow form or pink MOST form)  -  "MOST" Form in Place?  -      TOTAL TIME TAKING CARE OF THIS PATIENT: 35 minutes.    Vaughan Basta M.D on 09/28/2016 at 2:48 PM  Between 7am to 6pm - Pager - [phone]  After 6pm go to [email or web address] - password [email or web address] [facility]  Office  [phone]  CC: Primary care physician; Leone Haven, MD   Note: This dictation was prepared with Dragon dictation along with smaller phrase technology. Any transcriptional errors that result from this process are unintentional.

## 2016-09-28 NOTE — Progress Notes (Signed)
[facility]  Date of admission:  09/27/2016  Inpatient day:  09/28/2016  Consulting physician: Dr. Hillary Bow.   Reason for Consultation:  Pancytopenia.  Chief Complaint: [name] is a 71 y.o. female with stage IV ovarian cancer s/p cycle #2 topotecan who was admitted through the oncology clinic with a hemaoglobin of 7.0 and black stools on anticoagulation.  HPI:  The patient began cycle #2 topotecan on 09/19/2016.  She received chemotherapy x 5 days followed bu OnPro Neulasta on 09/23/2016.  CBC on 09/19/2016 revealed a hematocrit of 28.0, hemoglobin 9.7, MCV 89.1, platelets 290,000, WBC 6000 with an ANC of 4500.  She returned to clinic on 09/27/2016 for a magnesium check as she chronically wastes magnesium secondary to prior carboplatin treatment.  She received 4 gm of IV magnesium.  She noted an interval history of abdominal cramping, diarrhea, and black stools.  Follow-up labs revealed a hematocrit of 20.3, hemoglobin 7.0, platelets 97,000, WBC 500 with an ANC of 200.  Stool guaiac was negative.  She stated that she had taken Pepto-Bismol.  She received 1 unit of PRBCs.  Hematocrit was 25.1 with a hemoglobin of 8.6 this morning.  Lovenox has been held.  Symptomatically, she notes ongoing diarrhea.  She denies any abdominal cramping.   Past Medical History:  Diagnosis Date  . CAD (coronary artery disease)    a. 09/2012 Cath: LM nl, LAD 95p, 4m LCX 969mOM2 50, RCA 100.  . Marland Kitchenholelithiasis   . Chronic diastolic CHF (congestive heart failure) ([facility] [place]  . Collagen vascular disease ([facility]  . Esophageal stricture   . Exertional shortness of breath   . GERD (gastroesophageal reflux disease)   . H/O hiatal hernia   . Herniated disc   . History of pancytopenia   . Hyperlipidemia   . Hypertension   . Hypokalemia   . Leukopenia 2012   s/p bone marrow biopsy, Dr. PaMa Hillock. NSTEMI (non-ST elevated myocardial infarction) ([place]   "mild" (10/18/2012)  .  Ovarian cancer ([facility]2016   chemo  . Pneumonia 2013; 08/2012   "one lung; double" (10/18/2012)  . Rheumatoid arthritis(714.0)   . Type I diabetes mellitus ([facility]   "dx'd in 1957" (10/18/2012)    Past Surgical History:  Procedure Laterality Date  . ABDOMINAL HYSTERECTOMY  06/15/2015   Dx L/S, EXLAP TAH BSO omentectomy RSRx colostomy diaphragm resection stripping  . CARDIAC CATHETERIZATION  10/18/2012   "first one was today" (10/18/2012)  . CATARACT EXTRACTION W/ INTRAOCULAR LENS IMPLANT Right 2010  . CHOLECYSTECTOMY  06/15/2015   combined case with ovarian cancer debulking  . COLON SURGERY    . CORONARY ARTERY BYPASS GRAFT N/A 10/19/2012   Procedure: CORONARY ARTERY BYPASS GRAFTING (CABG);  Surgeon: StMelrose NakayamaMD;  [facility] Service: Open Heart Surgery;  Laterality: N/A;  . ESOPHAGEAL DILATION     "3 or 4 times" (10/19/2011)  . ESOPHAGOGASTRODUODENOSCOPY  2012   Dr. IfMinna Merritts. OSTOMY    . OVARY SURGERY     removal  . PERIPHERAL VASCULAR CATHETERIZATION N/A 03/02/2015   Procedure: PoGlori Luisath Insertion;  Surgeon: JaAlgernon HuxleyMD;  [facility];  Service: Cardiovascular;  Laterality: N/A;  . TUBAL LIGATION  1970    Family History  Problem Relation Age of Onset  . Diabetes Mother   . Arthritis Mother   . Diabetes Father   . Arthritis Father   . Bone cancer Sister  Social History:  reports that she quit smoking about 22 years ago. Her smoking use included Cigarettes. She has a 30.00 pack-year smoking history. She has never used smokeless tobacco. She reports that she does not drink alcohol or use drugs.  She is alone today.  Allergies:  Allergies  Allergen Reactions  . Codeine Nausea And Vomiting  . Latex Rash    Facility-Administered Medications Prior to Admission  Medication Dose Route Frequency Provider Last Rate Last Dose  . Tbo-Filgrastim (GRANIX) injection 300 mcg  300 mcg Subcutaneous Once Lequita Asal, MD       Medications  Prior to Admission  Medication Sig Dispense Refill  . Calcium-Vitamin D 600-200 MG-UNIT tablet Take 1 tablet by mouth 2 (two) times daily.     . cholecalciferol (VITAMIN D) 1000 units tablet Take 1,000 Units by mouth daily.    Marland Kitchen enoxaparin (LOVENOX) 60 MG/0.6ML injection INJECT 0.6 ML (60 MG TOTAL) INTO THE SKIN EVERY 12 HOURS 36 mL 2  . folic acid (FOLVITE) 1 MG tablet Take 1 tablet (1 mg total) by mouth daily. 90 tablet 3  . furosemide (LASIX) 80 MG tablet Take 0.5 tablets by mouth 2 (two) times daily.    . insulin lispro (HUMALOG) 100 UNIT/ML injection Inject 0.06 mLs (6 Units total) into the skin daily. (Patient taking differently: Inject 0-85 Units into the skin daily. Via insulin pump) 30 mL 3  . lidocaine-prilocaine (EMLA) cream APPLY 1 APPLICATION TOPICALLY AS NEEDED 1 HOUR PRIOR TO TREATMENT. COVER WITH PRESS N SEAL UNTIL TREATMENT TIME AS DIRECTED 30 g 1  . loratadine (CLARITIN) 10 MG tablet Take 1 tablet by mouth daily as needed.    . magnesium oxide (MAG-OX) 400 (241.3 Mg) MG tablet TAKE 1 TABLET BY MOUTH ONCE DAILY 30 tablet 1  . metoprolol tartrate (LOPRESSOR) 25 MG tablet Take 1 tablet (25 mg total) by mouth 2 (two) times daily. 180 tablet 3  . ondansetron (ZOFRAN) 8 MG tablet TAKE 1 TABLET BY MOUTH EVERY 8 HOURS AS NEEDED FOR NAUSEA OR VOMITING 30 tablet 1  . pantoprazole (PROTONIX) 40 MG tablet Take 1 tablet (40 mg total) by mouth 2 (two) times daily. 180 tablet 1  . potassium chloride SA (K-DUR,KLOR-CON) 20 MEQ tablet Take 1 tablet (20 mEq total) by mouth daily. 30 tablet 0  . predniSONE (DELTASONE) 5 MG tablet Take 5 mg by mouth daily with breakfast.    . vitamin C (ASCORBIC ACID) 500 MG tablet Take 500 mg by mouth daily.    Marland Kitchen zinc gluconate 50 MG tablet Take 50 mg by mouth daily.    . ciprofloxacin (CIPRO) 500 MG tablet Take 1 tablet (500 mg total) by mouth 2 (two) times daily. (Patient not taking: Reported on 09/27/2016) 14 tablet 0  . losartan (COZAAR) 25 MG tablet Take 1  tablet (25 mg total) by mouth daily. (Patient not taking: Reported on 09/19/2016) 90 tablet 2  . magnesium oxide (MAG-OX) 400 (241.3 Mg) MG tablet TAKE 1 TABLET BY MOUTH ONCE DAILY (Patient not taking: Reported on 09/27/2016) 30 tablet 0  . ONE TOUCH ULTRA TEST test strip     . ranitidine (ZANTAC) 150 MG tablet Take 1 tablet (150 mg total) by mouth 2 (two) times daily. (Patient not taking: Reported on 09/27/2016) 180 tablet 1    Review of Systems: GENERAL:  Feels "fine".  No fevers, sweats or weight loss. PERFORMANCE STATUS (ECOG):  1 HEENT:  No visual changes, runny nose, sore throat, mouth sores or  tenderness. Lungs: No shortness of breath or cough.  No hemoptysis. Cardiac:  No chest pain, palpitations, orthopnea, or PND. GI:  Diarrhea post chemotherapy.  Black stools (see HPI).  No nausea, vomiting, diarrhea, constipation, or hematochezia. GU:  No urgency, frequency, dysuria, or hematuria. Musculoskeletal:  No back pain.  No joint pain.  No muscle tenderness. Extremities:  No pain or swelling. Skin:  No rashes or skin changes. Neuro:  No headache, numbness or weakness, balance or coordination issues. Endocrine:  Diabetes on an insulin pump.  No thyroid issues, hot flashes or night sweats. Psych:  No mood changes, depression or anxiety. Pain:  No focal pain. Review of systems:  All other systems reviewed and found to be negative.  Physical Exam:  Blood pressure (!) 143/48, pulse 90, temperature 98 F (36.7 C), temperature source Oral, resp. rate 18, height 5' 3"  (1.6 m), weight 135 lb 14.4 oz (61.6 kg), SpO2 99 %.  GENERAL:  Well developed, well nourished, sitting comfortably eating breakfast on the medical unit in no acute distress. MENTAL STATUS:  Alert and oriented to person, place and time. HEAD:  Normocephalic, atraumatic, face symmetric, no Cushingoid features. EYES:  Gold rimmed glasses.  Blue eyes. Pupils equal round and reactive to light and accomodation.  No conjunctivitis or  scleral icterus. ENT:  Oropharynx clear without lesion.  Tongue normal. Mucous membranes moist.  RESPIRATORY:  Clear to auscultation without rales, wheezes or rhonchi. CARDIOVASCULAR:  Regular rate and rhythm without murmur, rub or gallop. ABDOMEN:  Soft, non-tender, with active bowel sounds, and no hepatosplenomegaly.  No masses.  Ostomy with dark brown/black liquid stool. SKIN:  No rashes, ulcers or lesions.  Insulin pump. EXTREMITIES: No edema, no skin discoloration or tenderness.  No palpable cords. LYMPH NODES: No palpable cervical, supraclavicular, axillary or inguinal adenopathy  NEUROLOGICAL: Unremarkable. PSYCH:  Appropriate.   Results for orders placed or performed during the hospital encounter of 09/27/16 (from the past 48 hour(s))  Type and screen [place]     Status: None   Collection Time: 09/27/16  2:30 PM  Result Value Ref Range   ABO/RH(D) A POS    Antibody Screen NEG    Sample Expiration 09/30/2016    Unit Number [id number]    Blood Component Type RBC, LR IRR    Unit division 00    Status of Unit ISSUED,FINAL    Transfusion Status OK TO TRANSFUSE    Crossmatch Result Compatible   Prepare RBC     Status: None   Collection Time: 09/27/16  5:40 PM  Result Value Ref Range   Order Confirmation ORDER PROCESSED BY BLOOD BANK   Glucose, capillary     Status: Abnormal   Collection Time: 09/27/16  5:45 PM  Result Value Ref Range   Glucose-Capillary 228 (H) 65 - 99 mg/dL   Comment 1 Notify RN   Glucose, capillary     Status: Abnormal   Collection Time: 09/27/16  8:15 PM  Result Value Ref Range   Glucose-Capillary 229 (H) 65 - 99 mg/dL  Hemoglobin and hematocrit, blood     Status: Abnormal   Collection Time: 09/28/16 12:07 AM  Result Value Ref Range   Hemoglobin 8.6 (L) 12.0 - 16.0 g/dL   HCT 25.1 (L) 35.0 - 47.0 %  Glucose, capillary     Status: Abnormal   Collection Time: 09/28/16  4:04 AM  Result Value Ref Range   Glucose-Capillary 146  (H) 65 - 99 mg/dL  Basic metabolic  panel     Status: Abnormal   Collection Time: 09/28/16  6:33 AM  Result Value Ref Range   Sodium 136 135 - 145 mmol/L   Potassium 4.1 3.5 - 5.1 mmol/L   Chloride 103 101 - 111 mmol/L   CO2 28 22 - 32 mmol/L   Glucose, Bld 141 (H) 65 - 99 mg/dL   BUN 18 6 - 20 mg/dL   Creatinine, Ser 0.62 0.44 - 1.00 mg/dL   Calcium 8.8 (L) 8.9 - 10.3 mg/dL   GFR calc non Af Amer >60 >60 mL/min   GFR calc Af Amer >60 >60 mL/min    Comment: (NOTE) The eGFR has been calculated using the CKD EPI equation. This calculation has not been validated in all clinical situations. eGFR's persistently <60 mL/min signify possible Chronic Kidney Disease.    Anion gap 5 5 - 15  CBC     Status: Abnormal   Collection Time: 09/28/16  6:33 AM  Result Value Ref Range   WBC 0.8 (LL) 3.6 - 11.0 K/uL    Comment: RESULT REPEATED AND VERIFIED CRITICAL RESULT CALLED TO, READ BACK BY AND VERIFIED WITH: MALKA SINWANY AT 0725 ON 09/28/16 [place].    RBC 2.90 (L) 3.80 - 5.20 MIL/uL   Hemoglobin 8.5 (L) 12.0 - 16.0 g/dL   HCT 25.1 (L) 35.0 - 47.0 %   MCV 86.5 80.0 - 100.0 fL   MCH 29.4 26.0 - 34.0 pg   MCHC 34.0 32.0 - 36.0 g/dL   RDW 16.3 (H) 11.5 - 14.5 %   Platelets 82 (L) 150 - 440 K/uL  Glucose, capillary     Status: Abnormal   Collection Time: 09/28/16  7:26 AM  Result Value Ref Range   Glucose-Capillary 128 (H) 65 - 99 mg/dL   No results found.  Assessment:  The patient is a 71 y.o. woman with stage IV ovarian cancer currently day 10 s/p cycle #2 topotecan with Neulasta support.  She is pancytopenic secondary to chemotherapy.  Black stools were likely secondary to Pepto-Bismol.  Hemoglobin has improved from 7.0 to 8.6 after 1 unit of PRBCs.  Hemoglobin is stable at 8.5 this AM.  Platelet count is drifting down.  Plan:   1.  Oncology:  Day 10 s/p cycle #2 topotecan for stage IV ovarian cancer.   2.  Hematology:  Neutropenic precautions.  WBC improving.  Anticipate ANC to recover  within next 2-3 days based on last cycle.  Platelet count nadir was 38,000 on day 12 and recovery by day 16 with her last cycle.  Lovenox was transiently held with her last cycle.  Continue to hold Lovenox.  Hematocrit has improved after 1 unit.  No current evidence of bleeding.  Check guaiac x 1.  3.  Gastroenterology:  Diarrhea persists likely secondary to chemotherapy.  Send stool for C diff and culture prior to discharge.  Check stool x 1 for blood.  Suspect black stool secondary to Pepto-Bismol.  Discussed use of Imodium.  4.  Disposition:  Anticipate discharge home with follow-up in clinic on 09/30/2016.  Thank you for allowing me to participate in [name] 's care.  I will follow her closely with you while hospitalized and after discharge in the outpatient department.   Lequita Asal, MD  09/28/2016, 9:14 AM

## 2016-09-28 NOTE — Plan of Care (Signed)
Problem: Education: Goal: Knowledge of [facility] Education information/materials will improve Outcome: Progressing VSS, free of falls during shift.  Received 1u PRBC, tolerated well, recheck Hgb 8.6.  Denies pain.  Requested sleep aid, Dr. Ara Kussmaul paged, pt asleep, did not receive ordered Trazodone.  No other needs overnight.  Pt refused reaccess of port for biopatch placement.  Bed in low position, call bell within reach.  WCTM.

## 2016-09-28 NOTE — Progress Notes (Signed)
Inpatient Diabetes Program Recommendations  AACE/ADA: New Consensus Statement on Inpatient Glycemic Control (2015)  Target Ranges:  Prepandial:   less than 140 mg/dL      Peak postprandial:   less than 180 mg/dL (1-2 hours)      Critically ill patients:  140 - 180 mg/dL   Results for [name], [name] (MRN [id number]) as of 09/28/2016 13:36  Ref. Range 09/28/2016 07:26 09/28/2016 11:49  Glucose-Capillary Latest Ref Range: 65 - 99 mg/dL 128 (H) 139 (H)    Home DM Meds: Insulin Pump  Current Insulin Orders: Insulin Pump    Met with patient this AM.  Patient A&O and able to independently manage her insulin pump.  See below for pump settings.  CBGs well controlled so far in hospital.    Note patient saw her Endocrinologist (Dr. Gabriel Carina with [facility]) on 09/13/16.   --Insulin Pump Settings--  Basal Rates: 12am- 0.5 units/hr 4am- 0.625 units/hr 5am- 1.1 units/hr 3pm- 0.6 units/hr  Total Basal Insulin per 24 hours period= 19.025 units  Carbohydrate Ratio:  12am- 1 unit for every 9.5 Grams of Carbohydrates 2pm- 1 unit for every 6.5 Grams of Carbohydrates  Correction/Sensitivity Factor: 1 unit for every 46 mg/dl above Target CBG  Target CBG: 120 mg/dl    --Will follow patient during hospitalization--  Wyn Quaker RN, MSN, CDE Diabetes Coordinator Inpatient Glycemic Control Team Team Pager: [phone] (8a-5p)

## 2016-09-28 NOTE — Care Management Obs Status (Signed)
[place] NOTIFICATION   Patient Details  Name: [name] MRN: [id number] Date of Birth: 1946-04-06   Medicare Observation Status Notification Given:  Yes    Shelbie Ammons, RN 09/28/2016, 8:50 AM

## 2016-09-28 NOTE — Progress Notes (Signed)
Pt is being discharged home. Discharge papers given and explained to pt. Pt verbalized understanding. F/u appointment and meds reviewed with pt. No RX at this time. Called volunteer to escort pt.

## 2016-09-30 ENCOUNTER — Inpatient Hospital Stay: Payer: Medicare Other

## 2016-09-30 ENCOUNTER — Other Ambulatory Visit: Payer: Self-pay | Admitting: Hematology and Oncology

## 2016-09-30 ENCOUNTER — Telehealth: Payer: Self-pay | Admitting: Family Medicine

## 2016-09-30 ENCOUNTER — Inpatient Hospital Stay: Payer: Medicare Other | Attending: Hematology and Oncology

## 2016-09-30 DIAGNOSIS — C569 Malignant neoplasm of unspecified ovary: Secondary | ICD-10-CM | POA: Diagnosis present

## 2016-09-30 DIAGNOSIS — Z66 Do not resuscitate: Secondary | ICD-10-CM | POA: Insufficient documentation

## 2016-09-30 DIAGNOSIS — F17210 Nicotine dependence, cigarettes, uncomplicated: Secondary | ICD-10-CM | POA: Insufficient documentation

## 2016-09-30 DIAGNOSIS — Z79899 Other long term (current) drug therapy: Secondary | ICD-10-CM | POA: Insufficient documentation

## 2016-09-30 DIAGNOSIS — C782 Secondary malignant neoplasm of pleura: Secondary | ICD-10-CM | POA: Diagnosis not present

## 2016-09-30 DIAGNOSIS — C801 Malignant (primary) neoplasm, unspecified: Secondary | ICD-10-CM

## 2016-09-30 DIAGNOSIS — I5032 Chronic diastolic (congestive) heart failure: Secondary | ICD-10-CM | POA: Diagnosis not present

## 2016-09-30 DIAGNOSIS — C786 Secondary malignant neoplasm of retroperitoneum and peritoneum: Secondary | ICD-10-CM

## 2016-09-30 DIAGNOSIS — D6481 Anemia due to antineoplastic chemotherapy: Secondary | ICD-10-CM | POA: Diagnosis not present

## 2016-09-30 DIAGNOSIS — E785 Hyperlipidemia, unspecified: Secondary | ICD-10-CM | POA: Diagnosis not present

## 2016-09-30 DIAGNOSIS — Z5111 Encounter for antineoplastic chemotherapy: Secondary | ICD-10-CM | POA: Insufficient documentation

## 2016-09-30 DIAGNOSIS — Z7689 Persons encountering health services in other specified circumstances: Secondary | ICD-10-CM | POA: Diagnosis not present

## 2016-09-30 DIAGNOSIS — I252 Old myocardial infarction: Secondary | ICD-10-CM | POA: Diagnosis not present

## 2016-09-30 DIAGNOSIS — Z8543 Personal history of malignant neoplasm of ovary: Secondary | ICD-10-CM | POA: Diagnosis not present

## 2016-09-30 DIAGNOSIS — E109 Type 1 diabetes mellitus without complications: Secondary | ICD-10-CM | POA: Insufficient documentation

## 2016-09-30 DIAGNOSIS — K219 Gastro-esophageal reflux disease without esophagitis: Secondary | ICD-10-CM | POA: Diagnosis not present

## 2016-09-30 DIAGNOSIS — I2510 Atherosclerotic heart disease of native coronary artery without angina pectoris: Secondary | ICD-10-CM | POA: Diagnosis not present

## 2016-09-30 DIAGNOSIS — K449 Diaphragmatic hernia without obstruction or gangrene: Secondary | ICD-10-CM | POA: Insufficient documentation

## 2016-09-30 DIAGNOSIS — E876 Hypokalemia: Secondary | ICD-10-CM | POA: Insufficient documentation

## 2016-09-30 DIAGNOSIS — I110 Hypertensive heart disease with heart failure: Secondary | ICD-10-CM | POA: Insufficient documentation

## 2016-09-30 DIAGNOSIS — M069 Rheumatoid arthritis, unspecified: Secondary | ICD-10-CM | POA: Diagnosis not present

## 2016-09-30 DIAGNOSIS — C7801 Secondary malignant neoplasm of right lung: Secondary | ICD-10-CM | POA: Diagnosis not present

## 2016-09-30 DIAGNOSIS — Z8719 Personal history of other diseases of the digestive system: Secondary | ICD-10-CM | POA: Diagnosis not present

## 2016-09-30 DIAGNOSIS — G629 Polyneuropathy, unspecified: Secondary | ICD-10-CM | POA: Diagnosis not present

## 2016-09-30 LAB — CBC WITH DIFFERENTIAL/PLATELET
Basophils Absolute: 0 10*3/uL (ref 0–0.1)
Basophils Relative: 1 %
Eosinophils Absolute: 0 10*3/uL (ref 0–0.7)
Eosinophils Relative: 0 %
HCT: 24.4 % — ABNORMAL LOW (ref 35.0–47.0)
Hemoglobin: 8.4 g/dL — ABNORMAL LOW (ref 12.0–16.0)
Lymphocytes Relative: 18 %
Lymphs Abs: 0.3 10*3/uL — ABNORMAL LOW (ref 1.0–3.6)
MCH: 29.9 pg (ref 26.0–34.0)
MCHC: 34.5 g/dL (ref 32.0–36.0)
MCV: 86.6 fL (ref 80.0–100.0)
Monocytes Absolute: 0.2 10*3/uL (ref 0.2–0.9)
Monocytes Relative: 11 %
Neutro Abs: 1.3 10*3/uL — ABNORMAL LOW (ref 1.4–6.5)
Neutrophils Relative %: 70 %
Platelets: 41 10*3/uL — ABNORMAL LOW (ref 150–440)
RBC: 2.82 MIL/uL — ABNORMAL LOW (ref 3.80–5.20)
RDW: 15.5 % — ABNORMAL HIGH (ref 11.5–14.5)
WBC: 1.9 10*3/uL — ABNORMAL LOW (ref 3.6–11.0)

## 2016-09-30 LAB — MAGNESIUM: Magnesium: 1.4 mg/dL — ABNORMAL LOW (ref 1.7–2.4)

## 2016-09-30 MED ORDER — MAGNESIUM SULFATE 4 GM/100ML IV SOLN
4.0000 g | Freq: Once | INTRAVENOUS | Status: AC
  Administered 2016-09-30: 4 g via INTRAVENOUS
  Filled 2016-09-30: qty 100

## 2016-09-30 MED ORDER — HEPARIN SOD (PORK) LOCK FLUSH 100 UNIT/ML IV SOLN
500.0000 [IU] | Freq: Once | INTRAVENOUS | Status: AC
  Administered 2016-09-30: 500 [IU] via INTRAVENOUS

## 2016-09-30 MED ORDER — EPOETIN ALFA 10000 UNIT/ML IJ SOLN
10000.0000 [IU] | Freq: Once | INTRAMUSCULAR | Status: AC
  Administered 2016-09-30: 10000 [IU] via SUBCUTANEOUS
  Filled 2016-09-30: qty 2

## 2016-09-30 MED ORDER — MAGNESIUM SULFATE 2 GM/50ML IV SOLN: 2.0000 g | Freq: Once | INTRAVENOUS | Status: DC

## 2016-09-30 MED ORDER — HEPARIN SOD (PORK) LOCK FLUSH 100 UNIT/ML IV SOLN
INTRAVENOUS | Status: AC
  Filled 2016-09-30: qty 5

## 2016-09-30 NOTE — Telephone Encounter (Signed)
Patient should follow up with oncology for TCM. Does not need to see me for hospital follow-up.

## 2016-09-30 NOTE — Telephone Encounter (Signed)
The recommended hospital follow up is for oncology on 10/03/16 so they would get TCM does PCP also want to follow up with Patient?

## 2016-10-03 ENCOUNTER — Inpatient Hospital Stay: Payer: Medicare Other

## 2016-10-03 ENCOUNTER — Other Ambulatory Visit: Payer: Self-pay | Admitting: Hematology and Oncology

## 2016-10-03 DIAGNOSIS — C786 Secondary malignant neoplasm of retroperitoneum and peritoneum: Secondary | ICD-10-CM

## 2016-10-03 DIAGNOSIS — C801 Malignant (primary) neoplasm, unspecified: Secondary | ICD-10-CM

## 2016-10-03 DIAGNOSIS — Z95828 Presence of other vascular implants and grafts: Secondary | ICD-10-CM

## 2016-10-03 DIAGNOSIS — C569 Malignant neoplasm of unspecified ovary: Secondary | ICD-10-CM

## 2016-10-03 LAB — MAGNESIUM: Magnesium: 1.3 mg/dL — ABNORMAL LOW (ref 1.7–2.4)

## 2016-10-03 MED ORDER — HEPARIN SOD (PORK) LOCK FLUSH 100 UNIT/ML IV SOLN
500.0000 [IU] | Freq: Once | INTRAVENOUS | Status: DC
  Filled 2016-10-03: qty 5

## 2016-10-03 MED ORDER — HEPARIN SOD (PORK) LOCK FLUSH 100 UNIT/ML IV SOLN
500.0000 [IU] | Freq: Once | INTRAVENOUS | Status: AC
  Administered 2016-10-03: 500 [IU] via INTRAVENOUS

## 2016-10-03 MED ORDER — MAGNESIUM SULFATE 4 GM/100ML IV SOLN
4.0000 g | Freq: Once | INTRAVENOUS | Status: AC
  Administered 2016-10-03: 4 g via INTRAVENOUS
  Filled 2016-10-03: qty 100

## 2016-10-03 MED ORDER — HEPARIN SOD (PORK) LOCK FLUSH 100 UNIT/ML IV SOLN
INTRAVENOUS | Status: AC
  Filled 2016-10-03: qty 5

## 2016-10-03 MED ORDER — SODIUM CHLORIDE 0.9 % IV SOLN
INTRAVENOUS | Status: DC
  Administered 2016-10-03: 14:00:00 via INTRAVENOUS
  Filled 2016-10-03: qty 1000

## 2016-10-03 MED ORDER — SODIUM CHLORIDE 0.9% FLUSH
10.0000 mL | INTRAVENOUS | Status: DC | PRN
  Filled 2016-10-03: qty 10

## 2016-10-04 ENCOUNTER — Telehealth: Payer: Self-pay | Admitting: *Deleted

## 2016-10-04 DIAGNOSIS — C569 Malignant neoplasm of unspecified ovary: Secondary | ICD-10-CM

## 2016-10-04 DIAGNOSIS — C801 Malignant (primary) neoplasm, unspecified: Secondary | ICD-10-CM

## 2016-10-04 DIAGNOSIS — Z5189 Encounter for other specified aftercare: Secondary | ICD-10-CM

## 2016-10-04 DIAGNOSIS — C786 Secondary malignant neoplasm of retroperitoneum and peritoneum: Secondary | ICD-10-CM

## 2016-10-04 NOTE — Telephone Encounter (Signed)
Patient called asking if she is to stay off of her Lovenox she has not taken for a week or is she to wait until she sees you on Monday to decide to restart it. Please advise

## 2016-10-04 NOTE — Telephone Encounter (Signed)
Patient agrees to come in 6/7 for lab and wants to wait for results to see if she can restart Lovenox

## 2016-10-04 NOTE — Telephone Encounter (Signed)
Per Dr Mike Gip, have patient come in by the end of the week for CBC

## 2016-10-06 ENCOUNTER — Inpatient Hospital Stay: Payer: Medicare Other

## 2016-10-06 DIAGNOSIS — C569 Malignant neoplasm of unspecified ovary: Secondary | ICD-10-CM

## 2016-10-06 DIAGNOSIS — C801 Malignant (primary) neoplasm, unspecified: Secondary | ICD-10-CM

## 2016-10-06 DIAGNOSIS — C786 Secondary malignant neoplasm of retroperitoneum and peritoneum: Secondary | ICD-10-CM

## 2016-10-06 DIAGNOSIS — Z5189 Encounter for other specified aftercare: Secondary | ICD-10-CM

## 2016-10-06 LAB — CBC WITH DIFFERENTIAL/PLATELET
Basophils Absolute: 0 10*3/uL (ref 0–0.1)
Basophils Relative: 0 %
Eosinophils Absolute: 0 10*3/uL (ref 0–0.7)
Eosinophils Relative: 0 %
HCT: 25.6 % — ABNORMAL LOW (ref 35.0–47.0)
Hemoglobin: 8.7 g/dL — ABNORMAL LOW (ref 12.0–16.0)
Lymphocytes Relative: 6 %
Lymphs Abs: 0.7 10*3/uL — ABNORMAL LOW (ref 1.0–3.6)
MCH: 29.8 pg (ref 26.0–34.0)
MCHC: 33.9 g/dL (ref 32.0–36.0)
MCV: 88 fL (ref 80.0–100.0)
Monocytes Absolute: 0.6 10*3/uL (ref 0.2–0.9)
Monocytes Relative: 5 %
Neutro Abs: 11.8 10*3/uL — ABNORMAL HIGH (ref 1.4–6.5)
Neutrophils Relative %: 89 %
Platelets: 309 10*3/uL (ref 150–440)
RBC: 2.91 MIL/uL — ABNORMAL LOW (ref 3.80–5.20)
RDW: 16.1 % — ABNORMAL HIGH (ref 11.5–14.5)
WBC: 13.2 10*3/uL — ABNORMAL HIGH (ref 3.6–11.0)

## 2016-10-07 ENCOUNTER — Telehealth: Payer: Self-pay | Admitting: *Deleted

## 2016-10-07 NOTE — Telephone Encounter (Signed)
Called and instructed patient to restart her lovenox per orders, voiced understanding.

## 2016-10-09 ENCOUNTER — Encounter: Payer: Self-pay | Admitting: Hematology and Oncology

## 2016-10-10 ENCOUNTER — Other Ambulatory Visit: Payer: Self-pay | Admitting: Hematology and Oncology

## 2016-10-10 ENCOUNTER — Inpatient Hospital Stay: Payer: Medicare Other

## 2016-10-10 ENCOUNTER — Encounter: Payer: Self-pay | Admitting: Hematology and Oncology

## 2016-10-10 ENCOUNTER — Inpatient Hospital Stay (HOSPITAL_BASED_OUTPATIENT_CLINIC_OR_DEPARTMENT_OTHER): Payer: Medicare Other | Admitting: Hematology and Oncology

## 2016-10-10 VITALS — BP 110/57 | HR 79 | Temp 97.5°F | Resp 18 | Wt 137.2 lb

## 2016-10-10 DIAGNOSIS — K219 Gastro-esophageal reflux disease without esophagitis: Secondary | ICD-10-CM | POA: Diagnosis not present

## 2016-10-10 DIAGNOSIS — F17210 Nicotine dependence, cigarettes, uncomplicated: Secondary | ICD-10-CM

## 2016-10-10 DIAGNOSIS — E876 Hypokalemia: Secondary | ICD-10-CM

## 2016-10-10 DIAGNOSIS — Z8719 Personal history of other diseases of the digestive system: Secondary | ICD-10-CM

## 2016-10-10 DIAGNOSIS — K449 Diaphragmatic hernia without obstruction or gangrene: Secondary | ICD-10-CM

## 2016-10-10 DIAGNOSIS — Z7689 Persons encountering health services in other specified circumstances: Secondary | ICD-10-CM | POA: Diagnosis not present

## 2016-10-10 DIAGNOSIS — I110 Hypertensive heart disease with heart failure: Secondary | ICD-10-CM

## 2016-10-10 DIAGNOSIS — Z79899 Other long term (current) drug therapy: Secondary | ICD-10-CM

## 2016-10-10 DIAGNOSIS — I252 Old myocardial infarction: Secondary | ICD-10-CM

## 2016-10-10 DIAGNOSIS — I2510 Atherosclerotic heart disease of native coronary artery without angina pectoris: Secondary | ICD-10-CM

## 2016-10-10 DIAGNOSIS — C786 Secondary malignant neoplasm of retroperitoneum and peritoneum: Secondary | ICD-10-CM

## 2016-10-10 DIAGNOSIS — C569 Malignant neoplasm of unspecified ovary: Secondary | ICD-10-CM

## 2016-10-10 DIAGNOSIS — C782 Secondary malignant neoplasm of pleura: Secondary | ICD-10-CM

## 2016-10-10 DIAGNOSIS — C801 Malignant (primary) neoplasm, unspecified: Secondary | ICD-10-CM

## 2016-10-10 DIAGNOSIS — D6481 Anemia due to antineoplastic chemotherapy: Secondary | ICD-10-CM | POA: Diagnosis not present

## 2016-10-10 DIAGNOSIS — E109 Type 1 diabetes mellitus without complications: Secondary | ICD-10-CM

## 2016-10-10 DIAGNOSIS — M069 Rheumatoid arthritis, unspecified: Secondary | ICD-10-CM

## 2016-10-10 DIAGNOSIS — I5032 Chronic diastolic (congestive) heart failure: Secondary | ICD-10-CM

## 2016-10-10 DIAGNOSIS — G629 Polyneuropathy, unspecified: Secondary | ICD-10-CM | POA: Diagnosis not present

## 2016-10-10 DIAGNOSIS — C7801 Secondary malignant neoplasm of right lung: Secondary | ICD-10-CM | POA: Diagnosis not present

## 2016-10-10 DIAGNOSIS — Z5111 Encounter for antineoplastic chemotherapy: Secondary | ICD-10-CM

## 2016-10-10 DIAGNOSIS — E785 Hyperlipidemia, unspecified: Secondary | ICD-10-CM

## 2016-10-10 DIAGNOSIS — Z8543 Personal history of malignant neoplasm of ovary: Secondary | ICD-10-CM

## 2016-10-10 DIAGNOSIS — Z66 Do not resuscitate: Secondary | ICD-10-CM

## 2016-10-10 LAB — CBC WITH DIFFERENTIAL/PLATELET
Basophils Absolute: 0.1 10*3/uL (ref 0–0.1)
Basophils Relative: 1 %
Eosinophils Absolute: 0 10*3/uL (ref 0–0.7)
Eosinophils Relative: 1 %
HCT: 24.7 % — ABNORMAL LOW (ref 35.0–47.0)
Hemoglobin: 8.6 g/dL — ABNORMAL LOW (ref 12.0–16.0)
Lymphocytes Relative: 11 %
Lymphs Abs: 0.8 10*3/uL — ABNORMAL LOW (ref 1.0–3.6)
MCH: 30.4 pg (ref 26.0–34.0)
MCHC: 34.9 g/dL (ref 32.0–36.0)
MCV: 87.2 fL (ref 80.0–100.0)
Monocytes Absolute: 0.8 10*3/uL (ref 0.2–0.9)
Monocytes Relative: 12 %
Neutro Abs: 5.4 10*3/uL (ref 1.4–6.5)
Neutrophils Relative %: 75 %
Platelets: 472 10*3/uL — ABNORMAL HIGH (ref 150–440)
RBC: 2.83 MIL/uL — ABNORMAL LOW (ref 3.80–5.20)
RDW: 16.8 % — ABNORMAL HIGH (ref 11.5–14.5)
WBC: 7.2 10*3/uL (ref 3.6–11.0)

## 2016-10-10 LAB — COMPREHENSIVE METABOLIC PANEL
ALT: 22 U/L (ref 14–54)
AST: 16 U/L (ref 15–41)
Albumin: 3.2 g/dL — ABNORMAL LOW (ref 3.5–5.0)
Alkaline Phosphatase: 144 U/L — ABNORMAL HIGH (ref 38–126)
Anion gap: 4 — ABNORMAL LOW (ref 5–15)
BUN: 11 mg/dL (ref 6–20)
CO2: 29 mmol/L (ref 22–32)
Calcium: 9.2 mg/dL (ref 8.9–10.3)
Chloride: 103 mmol/L (ref 101–111)
Creatinine, Ser: 0.77 mg/dL (ref 0.44–1.00)
GFR calc Af Amer: >60 mL/min (ref >60)
GFR calc non Af Amer: >60 mL/min (ref >60)
Glucose, Bld: 113 mg/dL — ABNORMAL HIGH (ref 65–99)
Potassium: 3.8 mmol/L (ref 3.5–5.1)
Sodium: 136 mmol/L (ref 135–145)
Total Bilirubin: 0.4 mg/dL (ref 0.3–1.2)
Total Protein: 6.6 g/dL (ref 6.5–8.1)

## 2016-10-10 LAB — MAGNESIUM: Magnesium: 1.2 mg/dL — ABNORMAL LOW (ref 1.7–2.4)

## 2016-10-10 MED ORDER — HEPARIN SOD (PORK) LOCK FLUSH 100 UNIT/ML IV SOLN
500.0000 [IU] | Freq: Once | INTRAVENOUS | Status: AC | PRN
  Administered 2016-10-10: 500 [IU]

## 2016-10-10 MED ORDER — SODIUM CHLORIDE 0.9 % IV SOLN: 4.0000 g | Freq: Once | INTRAVENOUS | Status: DC

## 2016-10-10 MED ORDER — ONDANSETRON HCL 4 MG PO TABS
8.0000 mg | ORAL_TABLET | Freq: Once | ORAL | Status: AC
  Administered 2016-10-10: 8 mg via ORAL
  Filled 2016-10-10: qty 2

## 2016-10-10 MED ORDER — MAGNESIUM SULFATE 4 GM/100ML IV SOLN
4.0000 g | Freq: Once | INTRAVENOUS | Status: AC
  Administered 2016-10-10: 4 g via INTRAVENOUS
  Filled 2016-10-10: qty 100

## 2016-10-10 MED ORDER — HEPARIN SOD (PORK) LOCK FLUSH 100 UNIT/ML IV SOLN
INTRAVENOUS | Status: AC
  Filled 2016-10-10: qty 5

## 2016-10-10 MED ORDER — TOPOTECAN HCL CHEMO INJECTION 4 MG
1.0000 mg/m2 | Freq: Once | INTRAVENOUS | Status: AC
  Administered 2016-10-10: 1.7 mg via INTRAVENOUS
  Filled 2016-10-10: qty 1.7

## 2016-10-10 MED ORDER — SODIUM CHLORIDE 0.9 % IV SOLN
Freq: Once | INTRAVENOUS | Status: AC
  Administered 2016-10-10: 11:00:00 via INTRAVENOUS
  Filled 2016-10-10: qty 1000

## 2016-10-10 NOTE — Progress Notes (Signed)
[facility] day:  10/10/2016   Chief Complaint: [name] is a 71 y.o. female with stage IV ovarian cancer who is seen for assessment prior to cycle #3 topotecan.  HPI:  The patient was last seen in the medical oncology clinic on 09/19/2016.  At that time, she felt a little fatigued.  UTI symptoms had resolved.  She had chronic hypomagnesemia (1.5) secondary to carboplatin. She began cycle #2 topotecan.  She continued weekly IV magnesium supplementation.  She was admitted to [facility] from 09/27/2016 - 09/28/2016 with anemia and diarrhea.  She was transfused with 1 unit of PRBCs for a hemoglobin of 7.0.  Stool studies were negative.  Annona was 200 on 09/27/2016. CBC on 09/30/2016 revealed a hematocrit of 24.4, hemoglobin 8.4, platelets 41,000, WBC 1900 with an ANC of 1300.  She received Procrit.  She restarted Lovenox on 10/07/2016 with recovery of her platelet count.  Symptomatically, she is doing well.  She denies any concerns.   Past Medical History:  Diagnosis Date  . CAD (coronary artery disease)    a. 09/2012 Cath: LM nl, LAD 95p, 71m LCX 967mOM2 50, RCA 100.  . Marland Kitchenholelithiasis   . Chronic diastolic CHF (congestive heart failure) ([facility]  . Collagen vascular disease ([facility]  . Esophageal stricture   . Exertional shortness of breath   . GERD (gastroesophageal reflux disease)   . H/O hiatal hernia   . Herniated disc   . History of pancytopenia   . Hyperlipidemia   . Hypertension   . Hypokalemia   . Leukopenia 2012   s/p bone marrow biopsy, Dr. PaMa Hillock. NSTEMI (non-ST elevated myocardial infarction) ([facility]   "mild" (10/18/2012)  . Ovarian cancer ([facility]2016   chemo  . Pneumonia 2013; 08/2012   "one lung; double" (10/18/2012)  . Rheumatoid arthritis(714.0)   . Type I diabetes mellitus ([facility]   "dx'd in 1957" (10/18/2012)    Past Surgical History:  Procedure Laterality Date  . ABDOMINAL HYSTERECTOMY  06/15/2015   Dx L/S, EXLAP  TAH BSO omentectomy RSRx colostomy diaphragm resection stripping  . CARDIAC CATHETERIZATION  10/18/2012   "first one was today" (10/18/2012)  . CATARACT EXTRACTION W/ INTRAOCULAR LENS IMPLANT Right 2010  . CHOLECYSTECTOMY  06/15/2015   combined case with ovarian cancer debulking  . COLON SURGERY    . CORONARY ARTERY BYPASS GRAFT N/A 10/19/2012   Procedure: CORONARY ARTERY BYPASS GRAFTING (CABG);  Surgeon: StMelrose NakayamaMD;  [place] Service: Open Heart Surgery;  Laterality: N/A;  . ESOPHAGEAL DILATION     "3 or 4 times" (10/19/2011)  . ESOPHAGOGASTRODUODENOSCOPY  2012   Dr. IfMinna Merritts. OSTOMY    . OVARY SURGERY     removal  . PERIPHERAL VASCULAR CATHETERIZATION N/A 03/02/2015   Procedure: PoGlori Luisath Insertion;  Surgeon: JaAlgernon HuxleyMD;  [place];  Service: Cardiovascular;  Laterality: N/A;  . TUBAL LIGATION  1970    Family History  Problem Relation Age of Onset  . Diabetes Mother   . Arthritis Mother   . Diabetes Father   . Arthritis Father   . Bone cancer Sister     Social History:  reports that she quit smoking about 22 years ago. Her smoking use included Cigarettes. She has a 30.00 pack-year smoking history. She has never used smokeless tobacco. She reports that she does not drink alcohol or use drugs.  She  is accompanied by her husband today.  Allergies:  Allergies  Allergen Reactions  . Codeine Nausea And Vomiting  . Latex Rash    Current Medications: Current Outpatient Prescriptions  Medication Sig Dispense Refill  . Calcium-Vitamin D 600-200 MG-UNIT tablet Take 1 tablet by mouth 2 (two) times daily.     . cholecalciferol (VITAMIN D) 1000 units tablet Take 1,000 Units by mouth daily.    . folic acid (FOLVITE) 1 MG tablet Take 1 tablet (1 mg total) by mouth daily. 90 tablet 3  . furosemide (LASIX) 80 MG tablet Take 0.5 tablets by mouth 2 (two) times daily.    . insulin lispro (HUMALOG) 100 UNIT/ML injection Inject 0.06 mLs (6 Units  total) into the skin daily. (Patient taking differently: Inject 0-85 Units into the skin daily. Via insulin pump) 30 mL 3  . lidocaine-prilocaine (EMLA) cream APPLY 1 APPLICATION TOPICALLY AS NEEDED 1 HOUR PRIOR TO TREATMENT. COVER WITH PRESS N SEAL UNTIL TREATMENT TIME AS DIRECTED 30 g 1  . loratadine (CLARITIN) 10 MG tablet Take 1 tablet by mouth daily as needed.    . magnesium oxide (MAG-OX) 400 (241.3 Mg) MG tablet TAKE 1 TABLET BY MOUTH ONCE DAILY 30 tablet 1  . metoprolol tartrate (LOPRESSOR) 25 MG tablet Take 1 tablet (25 mg total) by mouth 2 (two) times daily. 180 tablet 3  . ondansetron (ZOFRAN) 8 MG tablet TAKE 1 TABLET BY MOUTH EVERY 8 HOURS AS NEEDED FOR NAUSEA OR VOMITING 30 tablet 1  . pantoprazole (PROTONIX) 40 MG tablet Take 1 tablet (40 mg total) by mouth 2 (two) times daily. 180 tablet 1  . potassium chloride SA (K-DUR,KLOR-CON) 20 MEQ tablet Take 1 tablet (20 mEq total) by mouth daily. 30 tablet 0  . predniSONE (DELTASONE) 5 MG tablet Take 5 mg by mouth daily with breakfast.    . vitamin C (ASCORBIC ACID) 500 MG tablet Take 500 mg by mouth daily.    Marland Kitchen zinc gluconate 50 MG tablet Take 50 mg by mouth daily.    Marland Kitchen enoxaparin (LOVENOX) 60 MG/0.6ML injection Inject 60 mg into the skin 2 (two) times daily.     Current Facility-Administered Medications  Medication Dose Route Frequency Provider Last Rate Last Dose  . Tbo-Filgrastim (GRANIX) injection 300 mcg  300 mcg Subcutaneous Once Lequita Asal, MD       Facility-Administered Medications Ordered in Other Visits  Medication Dose Route Frequency Provider Last Rate Last Dose  . sodium chloride 0.9 % injection 10 mL  10 mL Intracatheter PRN Corcoran, Melissa C, MD      . sodium chloride 0.9 % injection 10 mL  10 mL Intravenous PRN Lequita Asal, MD   10 mL at 04/13/15 8315    Review of Systems:  GENERAL:  Feels "ok".  No fever, chills or sweats.  Weight down 4 pounds. PERFORMANCE STATUS (ECOG):  1 HEENT:  No visual  changes, sore throat, mouth sores or tenderness. Lungs:  No shortness of breath or cough.  No hemoptysis. Cardiac:  No chest pain, palpitations, orthopnea, or PND.  Took BP pill this AM. GI:  No abdominal pain.  No nausea, vomiting, diarrhea, melena or hematochezia. GU:  No urgency, frequency, dysuria, or hematuria. Musculoskeletal:  Severe rheumatoid arthritis (off MTX and on prednisone).  Osteoporosis.  No back pain. No muscle tenderness. Extremities:  No pain or swelling. Skin:  No increased bruising or bleeding.  No rashes or skin changes. Neuro:  Neuropathy (stable).  No headache, numbness  or weakness, balance or coordination issues. Endocrine:  Diabetes on an insulin pump.  Occasional hot flashes.  Psych:  No mood changes, depression or anxiety. Pain: No pain. Review of systems:  All other systems reviewed and found to be negative.  Physical Exam: 137 Blood pressure (!) 110/57, pulse 79, temperature 97.5 F (36.4 C), temperature source Tympanic, resp. rate 18, weight 137 lb 3 oz (62.2 kg). GENERAL:  Well developed, well nourished woman sitting comfortably in the exam room in no acute distress. MENTAL STATUS:  Alert and oriented to person, place and time. HEAD:  Alopecia.  Normocephalic, atraumatic, face symmetric, no Cushingoid features. EYES:  Gold rimmed glasses.  Blue eyes.  No conjunctivitis or scleral icterus.  ENT: Oropharynx clear without lesion. Tongue normal. Mucous membranes moist.  RESPIRATORY: Clear to auscultation without rales, wheezes or rhonchi. CARDIOVASCULAR: Regular rate and rhythm without murmur, rub or gallop. ABDOMEN: Soft, non-tender with active bowel sounds and no hepatosplenomegaly. No palpable nodularity or masses.  Insulin pump.  Ostomy bag. SKIN: Slight bruising on abdomen s/p Lovenox injections. No rashes or ulcers. EXTREMITIES:  No edema, skin discoloration or tenderness. No palpable cords. LYMPH NODES: No palpable cervical, supraclavicular,  axillary or inguinal adenopathy  NEUROLOGICAL: Appropriate. PSYCH: Appropriate.   Radiology studies: 02/13/2015:  Abdomen and pelvic CT revealed bilateral mass-like adnexal regions (right adnexal mass 5.6 x 5.0 cm and the left adnexa mass 10.3 x 6.0 x 9.9 cm).  There was a large amount of soft tissue throughout the peritoneal cavity involving the omentum and other peritoneal surfaces.  There was a small volume ascites. There was a peripheral 3.3 x 1.9 cm low-attenuation lesion overlying the right lobe of the liver, likely representing a serosal implant. There was 1.4 x 2.2 cm ill-defined peripheral lesion within the inferior aspect of segment 6 adjacent to the ampulla in the duodenum.  04/28/2015:  Abdominal and pelvic CT revealed decreasing bilateral ovarian masses.   The left adnexal mass measured 4.0 x 6.1 cm (previously 5.0 x 8.5 cm). The right ovary measured 3.8 x 4.9 cm (previously 4.7 x 5.6 cm).  There was improved peritoneal carcinomatosis.  There was a small amount of ascites.  The hepatic dome lesion was stable. The previously seen right hepatic lobe lesion was not well-visualized.  There was a small left pleural effusion and trace right pleural effusion.  The nodular lesion at the ampulla of Vater, extending into the duodenum was stable.  There was no biliary ductal dilatation. 08/01/2015:  Rght upper extremity ultrasound revealed a near occlusive thrombus within the central portion of the right internal jugular vein and central portion of the right subclavian vein. 09/01/2015:  Chest, abdomen, and pelvic CT revealed continued decrease in perihepatic fluid collection contiguous with the right pleural space with percutaneous drain.  09/11/2015:  Chest, abdomen, and pelvic CT revealed a residual versus recurrent fluid collection posterior to the right hepatic lobe (2.6 x 1.1 x 4.0 cm).   10/13/2015:  Chest, abdomen, and pelvic CT revealed resolution of the empyema. 02/15/2016:  Chest, abdomen,  and pelvic CT revealed multiple soft tissue nodules throughout the pelvis, small bowel mesenteric and peritoneum and, concerning for peritoneal metastatic disease.  There was soft tissue irregularity within the left upper quadrant.  There was mild right hydronephrosis (etiology unclear).  There was interval resolution of previously described right pleural based fluid and gas collection. There was a pleural based nodule within the right lower hemi-thorax concerning for pleural based metastasis.  There were small  bilateral pleural effusions.  There was pulmonary nodularity, predominately within the left upper lobe (metastatic or infectious/inflammatory etiology).  There was slightly increased mediastinal adenopathy (infectious/inflammatory or metastatic).  There was a low attenuation lesion within the left hepatic lobe (complicated fluid within the fissure or metastatic disease). 03/08/2016:  Abdomen and pelvic CT revealed mild progression of peritoneal disease in the abdomen.  There was interval increase in loculated fluid around the lateral segment left liver, stomach, and spleen.  There was persistent soft tissue lesion at the level of the ampulla with mild intra and extrahepatic biliary duct dilatation.  There was persistent intrahepatic and capsular metastatic disease involving the liver. 05/26/2016:  Head MRI revealed multiple small areas of acute infarct involving the occipital parietal lobe bilaterally and the left lateral cerebellum,  consistent with posterior circulation emboli.  06/10/2016:  Adomen and pelvic CT revealed progression of peritoneal carcinomatosis predominantly in the perihepatic space, bilateral lower quadrants and pelvis.  There was worsening bilateral obstructive uropathy due to malignant involvement of the pelvic ureters.  There was stable small pleural/subpleural nodules at the right lung base.  There was stable small left pleural effusion.  There was decreased small volume perihepatic  ascites.  08/09/2016:  Abdomen and pelvic CT revealed interval increase and loculated fluid collections within the peritoneal space consistent with progression of peritoneal ovarian carcinoma metastasis.  There was one large 7 cm collection in the RIGHT lower quadrant which had increased significantly in size and may represent of point of small bowel obstruction.  The proximal stomach and duodenum were decompressed. The mid small bowel was mildly dilated. There was concern for obstruction given the large intraperitoneal fluid collection.  There was increase in subcapsular hepatic metastatic fluid collections.  There was enlargement of rounded ampullary lesion in the second portion duodenum concerning for metastatic lesion.  There was mild biliary duct dilatation (stable).  There was nodular pleural metastasis in the RIGHT lower lobe pleural space.   Appointment on 10/10/2016  Component Date Value Ref Range Status  . WBC 10/10/2016 7.2  3.6 - 11.0 K/uL Final  . RBC 10/10/2016 2.83* 3.80 - 5.20 MIL/uL Final  . Hemoglobin 10/10/2016 8.6* 12.0 - 16.0 g/dL Final  . HCT 10/10/2016 24.7* 35.0 - 47.0 % Final  . MCV 10/10/2016 87.2  80.0 - 100.0 fL Final  . MCH 10/10/2016 30.4  26.0 - 34.0 pg Final  . MCHC 10/10/2016 34.9  32.0 - 36.0 g/dL Final  . RDW 10/10/2016 16.8* 11.5 - 14.5 % Final  . Platelets 10/10/2016 472* 150 - 440 K/uL Final  . Neutrophils Relative % 10/10/2016 75  % Final  . Neutro Abs 10/10/2016 5.4  1.4 - 6.5 K/uL Final  . Lymphocytes Relative 10/10/2016 11  % Final  . Lymphs Abs 10/10/2016 0.8* 1.0 - 3.6 K/uL Final  . Monocytes Relative 10/10/2016 12  % Final  . Monocytes Absolute 10/10/2016 0.8  0.2 - 0.9 K/uL Final  . Eosinophils Relative 10/10/2016 1  % Final  . Eosinophils Absolute 10/10/2016 0.0  0 - 0.7 K/uL Final  . Basophils Relative 10/10/2016 1  % Final  . Basophils Absolute 10/10/2016 0.1  0 - 0.1 K/uL Final  . Sodium 10/10/2016 136  135 - 145 mmol/L Final  . Potassium  10/10/2016 3.8  3.5 - 5.1 mmol/L Final  . Chloride 10/10/2016 103  101 - 111 mmol/L Final  . CO2 10/10/2016 29  22 - 32 mmol/L Final  . Glucose, Bld 10/10/2016 113* 65 - 99  mg/dL Final  . BUN 10/10/2016 11  6 - 20 mg/dL Final  . Creatinine, Ser 10/10/2016 0.77  0.44 - 1.00 mg/dL Final  . Calcium 10/10/2016 9.2  8.9 - 10.3 mg/dL Final  . Total Protein 10/10/2016 6.6  6.5 - 8.1 g/dL Final  . Albumin 10/10/2016 3.2* 3.5 - 5.0 g/dL Final  . AST 10/10/2016 16  15 - 41 U/L Final  . ALT 10/10/2016 22  14 - 54 U/L Final  . Alkaline Phosphatase 10/10/2016 144* 38 - 126 U/L Final  . Total Bilirubin 10/10/2016 0.4  0.3 - 1.2 mg/dL Final  . GFR calc non Af Amer 10/10/2016 >60  >60 mL/min Final  . GFR calc Af Amer 10/10/2016 >60  >60 mL/min Final   Comment: (NOTE) The eGFR has been calculated using the CKD EPI equation. This calculation has not been validated in all clinical situations. eGFR's persistently <60 mL/min signify possible Chronic Kidney Disease.   . Anion gap 10/10/2016 4* 5 - 15 Final  . Magnesium 10/10/2016 1.2* 1.7 - 2.4 mg/dL Final    Assessment:  LASHAUNDA SCHILD is a 71 y.o. female with progressive stage IV ovarian cancer.  She presented with abdominal discomfort and bloating.  Omental biopsy on 02/23/2015 revealed metastatic high grade serous carcinoma, consistent with gynecologic origin.   She was initially diagnosed with clinical stage IIIC (T3cN1Mx).  CA125 was 707 on 02/17/2015.  She received 4 cycles of neoadjuvant carboplatin and Taxol (03/05/2015 - 05/22/2015).  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  She had loose stools on oral magnesium.  She was initially on Neurontin then switched Lyrica with cycle #3.  Cycle #4 was notable for neutropenia (ANC 300) requiring GCSF x 3 days.    She received tamoxifen from 02/25/2016 - 03/14/2016.  She received 4 cycles of carboplatin and gemcitabine (03/21/2016 - 05/24/2016) with GCSF/Neulasta support.  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  She received 2 cycles of Doxil (06/21/2016 - 07/19/2016) with Neulasta support.   CA125 was 802.9 on 03/30/2015, 567.9 on 04/13/2015, 168.8 on 05/15/2015, 85.2 on 07/17/2015, 68.6 on 07/28/2015, 34.5 on 09/17/2015, 20.7 on 11/06/2015, 49 on 01/22/2016, 106.1 on 02/22/2016, 138.8 on 03/07/2016, 220.8 on 03/21/2016, 152.8 on 04/11/2016, 125.6 on 04/18/2016, 76.8 on 05/03/2016, 60.2 on 05/24/2016, 44 on 07/19/2016, 65.8 on 08/17/2016, 53.4 on 09/12/2016, and 47.3 on 10/10/2016.  She underwent exploratory laparotomy, lysis of adhesions, total abdominal hysterectomy with bilateral salpingo-oophorectomy, infracolic omentectomy, optimal tumor debulking(< 1 cm), recto-sigmoid resection with creation of end colostomy, cholecystectomy, mobilization of splenic flexure and liver with diaphragmatic stripping on 06/15/2015. The right diaphragm was cleared of tumor. During dissection, the diaphragm was entered and closed with sutures.   She had a recurrent right sided pleural effusion.  She underwent thoracentesis of 650 cc on post-operative day 3.  She was admitted to [facility] on 07/01/2015 and 07/07/2015 for recurrent shortness of breath.  She underwent 2 additional thoracenteses (1.1 L on 07/02/2015 and 850 cc on 07/08/2015).  Cytology was negative x 2.  Bilateral lower extremity duplex on 07/03/2015 was negative.  Echo revealed an EF of 55-60% on 07/08/2015 and 60-65% on 05/27/2016.    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 She was on Lovenox 60 mg twice a day.  She switched to Eliquis on 04/16/2016 then returned back to Lovenox after her  CVA.  She was admitted to [facility] from 07/29/2015 - 08/10/2015 with a right-sided empyema and liver abscess. She underwent CT-guided placement of a liver abscess drain on 07/30/2015.  Liver  abscess culture grew out group B strep and Enterobacter which was sensitive to Zosyn. She was transitioned to ertapenem Colbert Ewing) prior to discharge.  She was readmitted to [facility] on 09/17/2015.  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  Head MRI on 05/26/2016 revealed multiple small areas of acute infarct involving the occipital parietal lobe bilaterally and the left lateral cerebellum,  consistent with posterior circulation emboli.  Work-up included a negative carotid ultrasound and echocardiogram.  She was admitted to [facility] from 08/10/2016 - 08/14/2016 with a small bowel obstruction.  She was managed conservatively.  She has severe rheumatoid arthritis.  Methotrexate and Enbrel were initially on hold.  She has a normocytic anemia.  Work-up on 02/17/2015 and 05/24/2016 revealed a normal ferritin, B12, folate, TSH.  She denies any melena or hematochezia.    She has anemia due to chronic disease. She received 1 unit PRBCs during her admission at [facility]. She denies any melena or hematochezia. She has diabetes and is on an insulin pump.  She has had persistent neutropenia felt secondary to her rheumatoid arthritis.  Folate and MMA were normal.  TSH was 6.13 (high) with a free T4 of 1.17 (0.61-1.12).  She began methotrexate (10 mg a week) and prednisone (5 mg a day) for severe rheumatoid arthritis on 12/17/2015.  She remains on prednisone alone (5 mg a day).  Bone marrow aspirate and biopsy on 06/09/2010 revealed a hypercellular marrow (70%) with no evidence of dysplasia or malignancy.  Flow cytometry was negative.  Cytogenetics were normal (46,XX).  FISH studies were negative for MDS.  Bone marrow aspirate and biopsy on 12/03/2015 revealed a normocellular to mildy hypercellular marrow for age (40%) with left shifted myelopoiesis, non specific dyserythropoiesis and mild megakaryocytic atypia with no increase in blasts.  There were multiple small  nonspecific lymphoid aggregates (favor reactive). There was no increase in reticulin.  There was decreased myeloid cells (37%) with left shifted maturation and 1% atypical myelod blasts.  There was relatively increased monocytic cells (11%), relatively increased lymphoid cells (36%), and relatively increased eosinophils (6%).  Cytogenetics were normal (50, XX).  SNP microarray was normal.  Abdomen and pelvic CT scan on 08/09/2016 revealed interval increase and loculated fluid collections within the peritoneal space consistent with progression of peritoneal ovarian carcinoma metastasis.  There was one large 7 cm collection in the RIGHT lower quadrant which had increased significantly in size and may represent of point of small bowel obstruction.  There was increase in subcapsular hepatic metastatic fluid collections.  There was enlargement of rounded ampullary lesion in the second portion duodenum concerning for metastatic lesion.  There was mild biliary duct dilatation (stable).  There was nodular pleural metastasis in the RIGHT lower lobe pleural space.  She is currently day 1 of cycle #3 topotecan with Neulasta support (08/22/2016 - 09/19/2016).  She was diagnosed with a Serratia marcescens UTI on 09/12/2016.  She was treated with a course of ciprofloxacin.  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  She receives IV magnesium weekly.  She has chemotherapy induced anemia.  She has received Procrit (last 09/30/2016).  Labs on 08/22/2016 revealed a normal B12 and folate.  Ferritin was 37, iron saturation 11% and TIBC 281.  Code status is DNR/DNI.  Symptomatically, she feels "ok".  Exam is stable.  CA125 is decreasing.  She has chronic hypomagnesemia (1.2) secondary to carboplatin.   Plan: 1.  Labs today:  CBC with diff, CMP, Mg, CA125. 2.  Begin cycle #3 topotecan (Monday-Friday) with OnProNeulasta on 10/14/2016. 3.  Magnesium 4 gm IV today 4.  Check Mg on 06/13 +/- IV Mg 5.  Check CBC  on 06/15 for +/- Procrit. 6.  RTC weekly x 2 on Mondays for lab (CBC with diff, Mg) +/- IV Mg. 7.  Schedule abdomen and pelvic CT scan on 11/03/2016 (with port access before). 8.  RTC 07/09 for MD assessment, labs (CBC with diff, CMP, Mg, CA125), and cycle #4 topotecan for 5 days, +/- IV Mg.   Lequita Asal, MD  10/10/2016, 10:14 AM

## 2016-10-11 ENCOUNTER — Inpatient Hospital Stay: Payer: Medicare Other

## 2016-10-11 VITALS — BP 131/68 | HR 73 | Temp 98.0°F | Resp 22

## 2016-10-11 DIAGNOSIS — C569 Malignant neoplasm of unspecified ovary: Secondary | ICD-10-CM | POA: Diagnosis not present

## 2016-10-11 DIAGNOSIS — C801 Malignant (primary) neoplasm, unspecified: Secondary | ICD-10-CM

## 2016-10-11 DIAGNOSIS — C786 Secondary malignant neoplasm of retroperitoneum and peritoneum: Secondary | ICD-10-CM

## 2016-10-11 LAB — CA 125: CA 125: 47.3 U/mL — ABNORMAL HIGH (ref 0.0–38.1)

## 2016-10-11 MED ORDER — ONDANSETRON HCL 4 MG PO TABS
8.0000 mg | ORAL_TABLET | Freq: Once | ORAL | Status: AC
  Administered 2016-10-11: 8 mg via ORAL
  Filled 2016-10-11: qty 2

## 2016-10-11 MED ORDER — SODIUM CHLORIDE 0.9 % IV SOLN
Freq: Once | INTRAVENOUS | Status: AC
  Administered 2016-10-11: 14:00:00 via INTRAVENOUS
  Filled 2016-10-11: qty 1000

## 2016-10-11 MED ORDER — SODIUM CHLORIDE 0.9% FLUSH
10.0000 mL | Freq: Once | INTRAVENOUS | Status: AC
  Administered 2016-10-11: 10 mL via INTRAVENOUS
  Filled 2016-10-11: qty 10

## 2016-10-11 MED ORDER — TOPOTECAN HCL CHEMO INJECTION 4 MG
1.0000 mg/m2 | Freq: Once | INTRAVENOUS | Status: AC
  Administered 2016-10-11: 1.7 mg via INTRAVENOUS
  Filled 2016-10-11: qty 1.7

## 2016-10-11 MED ORDER — HEPARIN SOD (PORK) LOCK FLUSH 100 UNIT/ML IV SOLN
500.0000 [IU] | Freq: Once | INTRAVENOUS | Status: AC
  Administered 2016-10-11: 500 [IU] via INTRAVENOUS
  Filled 2016-10-11: qty 5

## 2016-10-12 ENCOUNTER — Other Ambulatory Visit: Payer: Self-pay | Admitting: Hematology and Oncology

## 2016-10-12 ENCOUNTER — Inpatient Hospital Stay: Payer: Medicare Other

## 2016-10-12 VITALS — BP 147/63 | HR 76 | Temp 98.7°F | Resp 18

## 2016-10-12 DIAGNOSIS — Z5111 Encounter for antineoplastic chemotherapy: Secondary | ICD-10-CM

## 2016-10-12 DIAGNOSIS — C569 Malignant neoplasm of unspecified ovary: Secondary | ICD-10-CM

## 2016-10-12 DIAGNOSIS — C801 Malignant (primary) neoplasm, unspecified: Secondary | ICD-10-CM

## 2016-10-12 DIAGNOSIS — C786 Secondary malignant neoplasm of retroperitoneum and peritoneum: Secondary | ICD-10-CM

## 2016-10-12 LAB — MAGNESIUM: Magnesium: 1.5 mg/dL — ABNORMAL LOW (ref 1.7–2.4)

## 2016-10-12 MED ORDER — HEPARIN SOD (PORK) LOCK FLUSH 100 UNIT/ML IV SOLN
500.0000 [IU] | Freq: Once | INTRAVENOUS | Status: AC | PRN
Start: 2016-10-12 — End: 2016-10-12
  Administered 2016-10-12: 500 [IU]
  Filled 2016-10-12: qty 5

## 2016-10-12 MED ORDER — TOPOTECAN HCL CHEMO INJECTION 4 MG
1.0000 mg/m2 | Freq: Once | INTRAVENOUS | Status: AC
  Administered 2016-10-12: 1.7 mg via INTRAVENOUS
  Filled 2016-10-12: qty 1.7

## 2016-10-12 MED ORDER — MAGNESIUM SULFATE 2 GM/50ML IV SOLN
2.0000 g | Freq: Once | INTRAVENOUS | Status: AC
  Administered 2016-10-12: 2 g via INTRAVENOUS
  Filled 2016-10-12: qty 50

## 2016-10-12 MED ORDER — SODIUM CHLORIDE 0.9 % IV SOLN
Freq: Once | INTRAVENOUS | Status: AC
  Administered 2016-10-12: 14:00:00 via INTRAVENOUS
  Filled 2016-10-12: qty 1000

## 2016-10-12 MED ORDER — ONDANSETRON HCL 4 MG PO TABS
8.0000 mg | ORAL_TABLET | Freq: Once | ORAL | Status: AC
  Administered 2016-10-12: 8 mg via ORAL
  Filled 2016-10-12: qty 2

## 2016-10-12 MED ORDER — SODIUM CHLORIDE 0.9% FLUSH
10.0000 mL | INTRAVENOUS | Status: DC | PRN
  Filled 2016-10-12: qty 10

## 2016-10-13 ENCOUNTER — Inpatient Hospital Stay: Payer: Medicare Other

## 2016-10-13 VITALS — BP 107/64 | HR 78 | Temp 98.4°F | Resp 16

## 2016-10-13 DIAGNOSIS — C569 Malignant neoplasm of unspecified ovary: Secondary | ICD-10-CM | POA: Diagnosis not present

## 2016-10-13 DIAGNOSIS — C786 Secondary malignant neoplasm of retroperitoneum and peritoneum: Secondary | ICD-10-CM

## 2016-10-13 DIAGNOSIS — C801 Malignant (primary) neoplasm, unspecified: Secondary | ICD-10-CM

## 2016-10-13 MED ORDER — TOPOTECAN HCL CHEMO INJECTION 4 MG
1.0000 mg/m2 | Freq: Once | INTRAVENOUS | Status: AC
  Administered 2016-10-13: 1.7 mg via INTRAVENOUS
  Filled 2016-10-13: qty 1.7

## 2016-10-13 MED ORDER — SODIUM CHLORIDE 0.9 % IV SOLN
Freq: Once | INTRAVENOUS | Status: AC
  Administered 2016-10-13: 14:00:00 via INTRAVENOUS
  Filled 2016-10-13: qty 1000

## 2016-10-13 MED ORDER — ONDANSETRON HCL 4 MG PO TABS
8.0000 mg | ORAL_TABLET | Freq: Once | ORAL | Status: AC
  Administered 2016-10-13: 8 mg via ORAL
  Filled 2016-10-13: qty 2

## 2016-10-13 NOTE — Progress Notes (Signed)
Cardiology Office Note  Date:  10/14/2016   ID:  Janet, [name] 31-Jan-1946, MRN [id number]  PCP:  Leone Haven, MD   Chief Complaint  Patient presents with  . other    6 month follow up. Meds reviewed by the pt. verbally. "doing well."     HPI:  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 She had spiking fevers, chills, severe shortness of breath. Sugars were in the 400s. She had a long course of antibiotics to her hospital visit. Secondary to significant cough, she was unable to lie flat for cardiac catheterization. She declined cardiac catheterization on the same hospital visit. On admission to the hospital, her immunosuppressants, Enbrel and methotrexate were held cardiac catheterization in followup showed severe three-vessel CAD.  bypass surgery, four-vessel bypass surgery with LIMA to the LAD, saphenous vein graft to the diagonal, vein graft to the OM, vein graft to the PDA.  She presents today for follow-up of her coronary disease, history of bypass   diagnosed with clinical stage IIIC (T3cN1Mx) ovarian cancer presenting with abdominal discomfort and bloating. Omental biopsy on 02/23/2015 revealed metastatic high grade serous carcinoma, consistent with gynecologic origin. CA125 was 707 on 02/17/2015.  Abdomen and pelvic CT scan on 02/13/2015 revealed bilateral mass-like adnexal regions (right adnexal mass 5.6 x 5.0 cm and the left adnexa mass 10.3 x 6.0 x 9.9 cm).  Surgery at [facility], with surgical debulking, resection of part of large  intestines, spot on liver (scaped it)  Developed Liver infection/abscess, put in drain, grew group B strep  and enterobacter abx on ABX 8 days at [facility] IV infusion ABX , changed to pill,  Drain was removed on 09/01/2015.  Port on right, developed clot 3/17,  on lovenox BID Arm was swelling, Pleural effusion Lost weight, down 40 pounds since 10/16 Echo in 2/17, 3/17  In follow-up today she is having chemotherapy daily Small pleural effusions, occasional leg swelling Lab work reviewed with her in detail, hemoglobin 8, platelet stable, white blood cell count down to 3 General weakness, mild shortness of breath on exertion, does not do very much Trying to maintain her appetite, weight down perhaps 5 pounds  Continues to take lovenox BID,  Denies any chest pain concerning for angina Not on a statin at this time Cholesterol down with weight loss  EKG on today's visit shows normal sinus rhythm  rate 75 bpm , no significant ST or T-wave changes  Other past medical history she has seen Precious Reel for her rheumatoid arthritis, on Enbrel and methotrexate. She reports symptoms are stable  Echocardiogram 12/23/2012 showed normal ejection fraction with no wall motion abnormality, normal right ventricular size and function, moderately elevated right ventricular systolic pressures.  Cardiac catheterization showed 100% proximally occluded RCA, critical ostial LAD disease, severe mid LAD disease, severe mid left circumflex disease, ejection fraction estimated at 50-55% with mild hypokinesis of the inferior wall.  PMH:   has a past medical history of CAD (coronary artery disease); Cholelithiasis; Chronic diastolic CHF (congestive heart failure) ([place]); Collagen vascular disease ([place]); Esophageal stricture; Exertional shortness of breath; GERD (gastroesophageal reflux disease); H/O hiatal hernia; Herniated disc; History of  pancytopenia; Hyperlipidemia; Hypertension; Hypokalemia; Leukopenia (2012); NSTEMI (non-ST elevated myocardial infarction) ([place]) (08/2012); Ovarian cancer ([place]) (2016); Pneumonia (2013;  08/2012); Rheumatoid arthritis(714.0); and Type I diabetes mellitus ([place]).  PSH:    Past Surgical History:  Procedure Laterality Date  . ABDOMINAL HYSTERECTOMY  06/15/2015   Dx L/S, EXLAP TAH BSO omentectomy RSRx colostomy diaphragm resection stripping  . CARDIAC CATHETERIZATION  10/18/2012   "first one was today" (10/18/2012)  . CATARACT EXTRACTION W/ INTRAOCULAR LENS IMPLANT Right 2010  . CHOLECYSTECTOMY  06/15/2015   combined case with ovarian cancer debulking  . COLON SURGERY    . CORONARY ARTERY BYPASS GRAFT N/A 10/19/2012   Procedure: CORONARY ARTERY BYPASS GRAFTING (CABG);  Surgeon: Melrose Nakayama, MD;  Location: [facility] [facility];  Service: Open Heart Surgery;  Laterality: N/A;  . ESOPHAGEAL DILATION     "3 or 4 times" (10/19/2011)  . ESOPHAGOGASTRODUODENOSCOPY  2012   Dr. Minna Merritts  . OSTOMY    . OVARY SURGERY     removal  . PERIPHERAL VASCULAR CATHETERIZATION N/A 03/02/2015   Procedure: Glori Luis Cath Insertion;  Surgeon: Algernon Huxley, MD;  Location: [place] [facility];  Service: Cardiovascular;  Laterality: N/A;  . TUBAL LIGATION  1970    Current Outpatient Prescriptions  Medication Sig Dispense Refill  . Calcium-Vitamin D 600-200 MG-UNIT tablet Take 1 tablet by mouth 2 (two) times daily.     . cholecalciferol (VITAMIN D) 1000 units tablet Take 1,000 Units by mouth daily.    Marland Kitchen enoxaparin (LOVENOX) 60 MG/0.6ML injection Inject 60 mg into the skin 2 (two) times daily.    . folic acid (FOLVITE) 1 MG tablet Take 1 tablet (1 mg total) by mouth daily. 90 tablet 3  . furosemide (LASIX) 80 MG tablet Take 0.5 tablets by mouth 2 (two) times daily.    . insulin lispro (HUMALOG) 100 UNIT/ML injection Inject 0.06 mLs (6 Units total) into the skin daily. (Patient taking differently: Inject 0-85 Units into the skin daily. Via insulin pump) 30 mL 3  . lidocaine-prilocaine (EMLA) cream APPLY 1 APPLICATION TOPICALLY AS NEEDED 1 HOUR PRIOR TO TREATMENT. COVER WITH PRESS N SEAL UNTIL TREATMENT TIME AS  DIRECTED 30 g 1  . loratadine (CLARITIN) 10 MG tablet Take 1 tablet by mouth daily as needed.    . magnesium oxide (MAG-OX) 400 (241.3 Mg) MG tablet TAKE 1 TABLET BY MOUTH ONCE DAILY 30 tablet 1  . metoprolol tartrate (LOPRESSOR) 25 MG tablet Take 1 tablet (25 mg total) by mouth 2 (two) times daily. 180 tablet 3  . ondansetron (ZOFRAN) 8 MG tablet TAKE 1 TABLET BY MOUTH EVERY 8 HOURS AS NEEDED FOR NAUSEA OR VOMITING 30 tablet 1  . pantoprazole (PROTONIX) 40 MG tablet Take 1 tablet (40 mg total) by mouth 2 (two) times daily. 180 tablet 1  . potassium chloride SA (K-DUR,KLOR-CON) 20 MEQ tablet Take 1 tablet (20 mEq total) by mouth daily. 30 tablet 0  . predniSONE (DELTASONE) 5 MG tablet Take 5 mg by mouth daily with breakfast.    . vitamin C (ASCORBIC ACID) 500 MG tablet Take 500 mg by mouth daily.    Marland Kitchen zinc gluconate 50 MG tablet Take 50 mg by mouth daily.     Current Facility-Administered Medications  Medication Dose Route Frequency Provider Last Rate Last Dose  . Tbo-Filgrastim (GRANIX) injection 300 mcg  300 mcg Subcutaneous Once Lequita Asal, MD       Facility-Administered Medications Ordered in Other Visits  Medication Dose Route Frequency  Provider Last Rate Last Dose  . heparin lock flush 100 unit/mL  500 Units Intravenous Once Corcoran, Melissa C, MD      . sodium chloride 0.9 % injection 10 mL  10 mL Intracatheter PRN Corcoran, Melissa C, MD      . sodium chloride 0.9 % injection 10 mL  10 mL Intravenous PRN Lequita Asal, MD   10 mL at 04/13/15 0852  . sodium chloride flush (NS) 0.9 % injection 10 mL  10 mL Intravenous PRN Lequita Asal, MD   10 mL at 10/14/16 1021     Allergies:   Codeine and Latex   Social History:  The patient  reports that she quit smoking about 22 years ago. Her smoking use included Cigarettes. She has a 30.00 pack-year smoking history. She has never used smokeless tobacco. She reports that she does not drink alcohol or use drugs.   Family  History:   family history includes Arthritis in her father and mother; Bone cancer in her sister; Diabetes in her father and mother.    Review of Systems: Review of Systems  Constitutional: Positive for malaise/fatigue.  Respiratory: Positive for shortness of breath.   Cardiovascular: Positive for leg swelling.  Gastrointestinal: Negative.   Musculoskeletal: Negative.   Neurological: Positive for weakness.  Psychiatric/Behavioral: Negative.   All other systems reviewed and are negative.    PHYSICAL EXAM: VS:  BP (!) 120/58 (BP Location: Left Arm, Patient Position: Sitting, Cuff Size: Normal)   Pulse 74   Ht 5\' 3"  (1.6 m)   Wt 133 lb 12 oz (60.7 kg)   BMI 23.69 kg/m  , BMI Body mass index is 23.69 kg/m. GEN: Thin, pale, in no acute distress  HEENT: normal  Neck: no JVD, carotid bruits, or masses Cardiac: RRR; no murmurs, rubs, or gallops,trace b/l leg  edema  Respiratory: Scant rales at the bases, normal work of breathing GI: soft, nontender, nondistended, + BS MS: no deformity or atrophy  Skin: warm and dry, no rash Neuro:  Strength and sensation are intact Psych: euthymic mood, full affect    Recent Labs: 05/24/2016: TSH 2.407 10/10/2016: ALT 22; BUN 11; Creatinine, Ser 0.77; Potassium 3.8; Sodium 136 10/12/2016: Magnesium 1.5 10/14/2016: Hemoglobin 8.4; Platelets 428    Lipid Panel Lab Results  Component Value Date   CHOL 174 05/28/2016   HDL 60 05/28/2016   LDLCALC 98 05/28/2016   TRIG 80 05/28/2016      Wt Readings from Last 3 Encounters:  10/14/16 133 lb 12 oz (60.7 kg)  10/10/16 137 lb 3 oz (62.2 kg)  09/28/16 135 lb 14.4 oz (61.6 kg)       ASSESSMENT AND PLAN:  Essential hypertension - Plan: EKG 12-Lead Orthostatic, we'll decrease metoprolol down to 12.5 mg twice a day If she continues to have low blood pressure will hold the metoprolol  Coronary artery disease due to lipid rich plaque - Plan: EKG 12-Lead Denies any anginal symptoms, no further  testing needed at this time  Mixed hyperlipidemia Currently not on a cholesterol medication Cholesterol down with weight loss  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 No further workup at this time. Continue current medication regimen.   Total encounter time more  than 25 minutes  Greater than 50% was spent in counseling and coordination of care with the patient   Disposition:   F/U  6 months   Orders Placed This Encounter  Procedures  . EKG 12-Lead     Signed, Esmond Plants, M.D., Ph.D. 10/14/2016  [place], [place]

## 2016-10-14 ENCOUNTER — Encounter: Payer: Self-pay | Admitting: Cardiovascular Disease

## 2016-10-14 ENCOUNTER — Inpatient Hospital Stay: Payer: Medicare Other

## 2016-10-14 ENCOUNTER — Other Ambulatory Visit: Payer: Self-pay | Admitting: *Deleted

## 2016-10-14 ENCOUNTER — Ambulatory Visit (INDEPENDENT_AMBULATORY_CARE_PROVIDER_SITE_OTHER): Payer: Medicare Other | Admitting: Cardiovascular Disease

## 2016-10-14 VITALS — BP 120/58 | HR 74 | Ht 63.0 in | Wt 133.8 lb

## 2016-10-14 VITALS — BP 137/75 | HR 80 | Temp 97.2°F | Resp 18

## 2016-10-14 DIAGNOSIS — C569 Malignant neoplasm of unspecified ovary: Secondary | ICD-10-CM | POA: Diagnosis not present

## 2016-10-14 DIAGNOSIS — I10 Essential (primary) hypertension: Secondary | ICD-10-CM

## 2016-10-14 DIAGNOSIS — I25708 Atherosclerosis of coronary artery bypass graft(s), unspecified, with other forms of angina pectoris: Secondary | ICD-10-CM

## 2016-10-14 DIAGNOSIS — C801 Malignant (primary) neoplasm, unspecified: Principal | ICD-10-CM

## 2016-10-14 DIAGNOSIS — E782 Mixed hyperlipidemia: Secondary | ICD-10-CM

## 2016-10-14 DIAGNOSIS — Z5111 Encounter for antineoplastic chemotherapy: Secondary | ICD-10-CM

## 2016-10-14 DIAGNOSIS — C786 Secondary malignant neoplasm of retroperitoneum and peritoneum: Secondary | ICD-10-CM

## 2016-10-14 LAB — CBC WITH DIFFERENTIAL/PLATELET
Basophils Absolute: 0.1 10*3/uL (ref 0–0.1)
Basophils Relative: 2 %
Eosinophils Absolute: 0 10*3/uL (ref 0–0.7)
Eosinophils Relative: 1 %
HCT: 23.6 % — ABNORMAL LOW (ref 35.0–47.0)
Hemoglobin: 8.4 g/dL — ABNORMAL LOW (ref 12.0–16.0)
Lymphocytes Relative: 15 %
Lymphs Abs: 0.5 10*3/uL — ABNORMAL LOW (ref 1.0–3.6)
MCH: 30.6 pg (ref 26.0–34.0)
MCHC: 35.5 g/dL (ref 32.0–36.0)
MCV: 86.2 fL (ref 80.0–100.0)
Monocytes Absolute: 0.2 10*3/uL (ref 0.2–0.9)
Monocytes Relative: 8 %
Neutro Abs: 2.2 10*3/uL (ref 1.4–6.5)
Neutrophils Relative %: 74 %
Platelets: 428 10*3/uL (ref 150–440)
RBC: 2.74 MIL/uL — ABNORMAL LOW (ref 3.80–5.20)
RDW: 16.8 % — ABNORMAL HIGH (ref 11.5–14.5)
WBC: 3 10*3/uL — ABNORMAL LOW (ref 3.6–11.0)

## 2016-10-14 MED ORDER — TOPOTECAN HCL CHEMO INJECTION 4 MG
1.0000 mg/m2 | Freq: Once | INTRAVENOUS | Status: AC
  Administered 2016-10-14: 1.7 mg via INTRAVENOUS
  Filled 2016-10-14: qty 1.7

## 2016-10-14 MED ORDER — EPOETIN ALFA 10000 UNIT/ML IJ SOLN
10000.0000 [IU] | Freq: Once | INTRAMUSCULAR | Status: AC
  Administered 2016-10-14: 10000 [IU] via SUBCUTANEOUS
  Filled 2016-10-14: qty 2

## 2016-10-14 MED ORDER — ONDANSETRON HCL 4 MG PO TABS
8.0000 mg | ORAL_TABLET | Freq: Once | ORAL | Status: AC
  Administered 2016-10-14: 8 mg via ORAL
  Filled 2016-10-14: qty 2

## 2016-10-14 MED ORDER — SODIUM CHLORIDE 0.9% FLUSH
10.0000 mL | INTRAVENOUS | Status: DC | PRN
  Administered 2016-10-14: 10 mL via INTRAVENOUS
  Filled 2016-10-14: qty 10

## 2016-10-14 MED ORDER — PEGFILGRASTIM 6 MG/0.6ML ~~LOC~~ PSKT
6.0000 mg | PREFILLED_SYRINGE | Freq: Once | SUBCUTANEOUS | Status: AC
  Administered 2016-10-14: 6 mg via SUBCUTANEOUS
  Filled 2016-10-14: qty 0.6

## 2016-10-14 MED ORDER — HEPARIN SOD (PORK) LOCK FLUSH 100 UNIT/ML IV SOLN
500.0000 [IU] | Freq: Once | INTRAVENOUS | Status: AC
  Administered 2016-10-14: 500 [IU] via INTRAVENOUS
  Filled 2016-10-14: qty 5

## 2016-10-14 MED ORDER — SODIUM CHLORIDE 0.9 % IV SOLN
Freq: Once | INTRAVENOUS | Status: AC
  Administered 2016-10-14: 12:00:00 via INTRAVENOUS
  Filled 2016-10-14: qty 1000

## 2016-10-14 NOTE — Patient Instructions (Addendum)
Medication Instructions:   Please cut the metoprolol in 1/2 twice a day If blood pressure continues to run low, Hold the metoprolol  Think about iron pill daily  Labwork:  No new labs needed  Testing/Procedures:  No further testing at this time   I recommend watching educational videos on topics of interest to you at:       [email or web address]  Enter code: [email or web address]    Follow-Up: It was a pleasure seeing you in the office today. Please call us if you have new issues that need to be addressed before your next appt.  [phone]  Your physician wants you to follow-up in: 6 months.  You will receive a reminder letter in the mail two months in advance. If you don't receive a letter, please call our office to schedule the follow-up appointment.  If you need a refill on your cardiac medications before your next appointment, please call your pharmacy.

## 2016-10-17 ENCOUNTER — Other Ambulatory Visit: Payer: Self-pay | Admitting: *Deleted

## 2016-10-17 ENCOUNTER — Inpatient Hospital Stay: Payer: Medicare Other

## 2016-10-17 ENCOUNTER — Other Ambulatory Visit: Payer: Self-pay | Admitting: Hematology and Oncology

## 2016-10-17 DIAGNOSIS — C801 Malignant (primary) neoplasm, unspecified: Secondary | ICD-10-CM

## 2016-10-17 DIAGNOSIS — D649 Anemia, unspecified: Secondary | ICD-10-CM

## 2016-10-17 DIAGNOSIS — C569 Malignant neoplasm of unspecified ovary: Secondary | ICD-10-CM | POA: Diagnosis not present

## 2016-10-17 DIAGNOSIS — C786 Secondary malignant neoplasm of retroperitoneum and peritoneum: Secondary | ICD-10-CM

## 2016-10-17 DIAGNOSIS — Z5111 Encounter for antineoplastic chemotherapy: Secondary | ICD-10-CM

## 2016-10-17 LAB — PREPARE RBC (CROSSMATCH)

## 2016-10-17 LAB — CBC WITH DIFFERENTIAL/PLATELET
Basophils Absolute: 0 10*3/uL (ref 0–0.1)
Basophils Relative: 1 %
Eosinophils Absolute: 0 10*3/uL (ref 0–0.7)
Eosinophils Relative: 0 %
HCT: 22.1 % — ABNORMAL LOW (ref 35.0–47.0)
Hemoglobin: 7.8 g/dL — ABNORMAL LOW (ref 12.0–16.0)
Lymphocytes Relative: 14 %
Lymphs Abs: 0.3 10*3/uL — ABNORMAL LOW (ref 1.0–3.6)
MCH: 30.8 pg (ref 26.0–34.0)
MCHC: 35.3 g/dL (ref 32.0–36.0)
MCV: 87.1 fL (ref 80.0–100.0)
Monocytes Absolute: 0.1 10*3/uL — ABNORMAL LOW (ref 0.2–0.9)
Monocytes Relative: 6 %
Neutro Abs: 1.7 10*3/uL (ref 1.4–6.5)
Neutrophils Relative %: 79 %
Platelets: 206 10*3/uL (ref 150–440)
RBC: 2.53 MIL/uL — ABNORMAL LOW (ref 3.80–5.20)
RDW: 17.1 % — ABNORMAL HIGH (ref 11.5–14.5)
WBC: 2.2 10*3/uL — ABNORMAL LOW (ref 3.6–11.0)

## 2016-10-17 LAB — MAGNESIUM: Magnesium: 1.3 mg/dL — ABNORMAL LOW (ref 1.7–2.4)

## 2016-10-17 LAB — SAMPLE TO BLOOD BANK

## 2016-10-17 MED ORDER — MAGNESIUM SULFATE 4 GM/100ML IV SOLN
4.0000 g | Freq: Once | INTRAVENOUS | Status: AC
  Administered 2016-10-17: 4 g via INTRAVENOUS
  Filled 2016-10-17: qty 100

## 2016-10-17 MED ORDER — SODIUM CHLORIDE 0.9 % IV SOLN
250.0000 mL | Freq: Once | INTRAVENOUS | Status: AC
  Administered 2016-10-17: 250 mL via INTRAVENOUS
  Filled 2016-10-17: qty 250

## 2016-10-17 MED ORDER — SODIUM CHLORIDE 0.9% FLUSH
10.0000 mL | INTRAVENOUS | Status: AC | PRN
  Administered 2016-10-17: 10 mL
  Filled 2016-10-17: qty 10

## 2016-10-17 MED ORDER — DIPHENHYDRAMINE HCL 25 MG PO CAPS
25.0000 mg | ORAL_CAPSULE | Freq: Once | ORAL | Status: AC
  Administered 2016-10-17: 25 mg via ORAL
  Filled 2016-10-17: qty 1

## 2016-10-17 MED ORDER — ACETAMINOPHEN 325 MG PO TABS
650.0000 mg | ORAL_TABLET | Freq: Once | ORAL | Status: AC
  Administered 2016-10-17: 650 mg via ORAL
  Filled 2016-10-17: qty 2

## 2016-10-17 MED ORDER — HEPARIN SOD (PORK) LOCK FLUSH 100 UNIT/ML IV SOLN
500.0000 [IU] | Freq: Every day | INTRAVENOUS | Status: AC | PRN
Start: 2016-10-17 — End: 2016-10-17
  Administered 2016-10-17: 500 [IU]
  Filled 2016-10-17: qty 5

## 2016-10-18 ENCOUNTER — Other Ambulatory Visit: Payer: Self-pay | Admitting: *Deleted

## 2016-10-18 ENCOUNTER — Telehealth: Payer: Self-pay | Admitting: *Deleted

## 2016-10-18 DIAGNOSIS — C569 Malignant neoplasm of unspecified ovary: Secondary | ICD-10-CM

## 2016-10-18 DIAGNOSIS — C786 Secondary malignant neoplasm of retroperitoneum and peritoneum: Secondary | ICD-10-CM

## 2016-10-18 DIAGNOSIS — C801 Malignant (primary) neoplasm, unspecified: Principal | ICD-10-CM

## 2016-10-18 LAB — TYPE AND SCREEN
ABO/RH(D): A POS
Antibody Screen: NEGATIVE
Unit division: 0

## 2016-10-18 LAB — BPAM RBC
Blood Product Expiration Date: 201806272359
ISSUE DATE / TIME: 201806181351
Unit Type and Rh: 6200

## 2016-10-18 NOTE — Telephone Encounter (Signed)
Called patient about lab appt on Friday and instructed her to hold her am dose of lovenox on Friday until we got her lab results, voiced understand.

## 2016-10-21 ENCOUNTER — Inpatient Hospital Stay: Payer: Medicare Other

## 2016-10-21 ENCOUNTER — Other Ambulatory Visit: Payer: Self-pay | Admitting: *Deleted

## 2016-10-21 DIAGNOSIS — C786 Secondary malignant neoplasm of retroperitoneum and peritoneum: Secondary | ICD-10-CM

## 2016-10-21 DIAGNOSIS — C801 Malignant (primary) neoplasm, unspecified: Principal | ICD-10-CM

## 2016-10-21 DIAGNOSIS — C569 Malignant neoplasm of unspecified ovary: Secondary | ICD-10-CM

## 2016-10-21 LAB — URINALYSIS, COMPLETE (UACMP) WITH MICROSCOPIC
Bacteria, UA: NONE SEEN
Bilirubin Urine: NEGATIVE
Glucose, UA: NEGATIVE mg/dL
Hgb urine dipstick: NEGATIVE
Ketones, ur: NEGATIVE mg/dL
Leukocytes, UA: NEGATIVE
Nitrite: NEGATIVE
Protein, ur: NEGATIVE mg/dL
Specific Gravity, Urine: 1.017 (ref 1.005–1.030)
pH: 5 (ref 5.0–8.0)

## 2016-10-21 LAB — CBC WITH DIFFERENTIAL/PLATELET
Basophils Absolute: 0.1 10*3/uL (ref 0–0.1)
Basophils Relative: 1 %
Eosinophils Absolute: 0 10*3/uL (ref 0–0.7)
Eosinophils Relative: 0 %
HCT: 25.9 % — ABNORMAL LOW (ref 35.0–47.0)
Hemoglobin: 8.9 g/dL — ABNORMAL LOW (ref 12.0–16.0)
Lymphocytes Relative: 15 %
Lymphs Abs: 0.8 10*3/uL — ABNORMAL LOW (ref 1.0–3.6)
MCH: 29.7 pg (ref 26.0–34.0)
MCHC: 34.2 g/dL (ref 32.0–36.0)
MCV: 87 fL (ref 80.0–100.0)
Monocytes Absolute: 0.6 10*3/uL (ref 0.2–0.9)
Monocytes Relative: 10 %
Neutro Abs: 4.2 10*3/uL (ref 1.4–6.5)
Neutrophils Relative %: 74 %
Platelets: 120 10*3/uL — ABNORMAL LOW (ref 150–440)
RBC: 2.98 MIL/uL — ABNORMAL LOW (ref 3.80–5.20)
RDW: 16.5 % — ABNORMAL HIGH (ref 11.5–14.5)
WBC: 5.6 10*3/uL (ref 3.6–11.0)

## 2016-10-21 LAB — MAGNESIUM: Magnesium: 1.3 mg/dL — ABNORMAL LOW (ref 1.7–2.4)

## 2016-10-21 LAB — SAMPLE TO BLOOD BANK

## 2016-10-21 MED ORDER — HEPARIN SOD (PORK) LOCK FLUSH 100 UNIT/ML IV SOLN
500.0000 [IU] | Freq: Once | INTRAVENOUS | Status: AC
  Administered 2016-10-21: 500 [IU] via INTRAVENOUS
  Filled 2016-10-21: qty 5

## 2016-10-21 MED ORDER — SODIUM CHLORIDE 0.9 % IV SOLN
Freq: Once | INTRAVENOUS | Status: AC
Start: 2016-10-21 — End: 2016-10-21
  Administered 2016-10-21: 11:00:00 via INTRAVENOUS
  Filled 2016-10-21: qty 1000

## 2016-10-21 MED ORDER — SODIUM CHLORIDE 0.9% FLUSH
10.0000 mL | Freq: Once | INTRAVENOUS | Status: AC | PRN
  Administered 2016-10-21: 10 mL
  Filled 2016-10-21: qty 10

## 2016-10-21 MED ORDER — MAGNESIUM SULFATE 4 GM/100ML IV SOLN
4.0000 g | Freq: Once | INTRAVENOUS | Status: AC
  Administered 2016-10-21: 4 g via INTRAVENOUS
  Filled 2016-10-21: qty 100

## 2016-10-22 LAB — URINE CULTURE: Culture: <10000 — AB

## 2016-10-24 ENCOUNTER — Other Ambulatory Visit: Payer: Self-pay | Admitting: *Deleted

## 2016-10-24 ENCOUNTER — Inpatient Hospital Stay: Payer: Medicare Other

## 2016-10-24 DIAGNOSIS — C569 Malignant neoplasm of unspecified ovary: Secondary | ICD-10-CM

## 2016-10-24 DIAGNOSIS — C801 Malignant (primary) neoplasm, unspecified: Principal | ICD-10-CM

## 2016-10-24 DIAGNOSIS — C786 Secondary malignant neoplasm of retroperitoneum and peritoneum: Secondary | ICD-10-CM

## 2016-10-24 DIAGNOSIS — Z5111 Encounter for antineoplastic chemotherapy: Secondary | ICD-10-CM

## 2016-10-24 LAB — CBC WITH DIFFERENTIAL/PLATELET
Basophils Absolute: 0 10*3/uL (ref 0–0.1)
Basophils Relative: 1 %
Eosinophils Absolute: 0 10*3/uL (ref 0–0.7)
Eosinophils Relative: 0 %
HCT: 25.5 % — ABNORMAL LOW (ref 35.0–47.0)
Hemoglobin: 8.9 g/dL — ABNORMAL LOW (ref 12.0–16.0)
Lymphocytes Relative: 11 %
Lymphs Abs: 0.7 10*3/uL — ABNORMAL LOW (ref 1.0–3.6)
MCH: 31 pg (ref 26.0–34.0)
MCHC: 35 g/dL (ref 32.0–36.0)
MCV: 88.5 fL (ref 80.0–100.0)
Monocytes Absolute: 0.5 10*3/uL (ref 0.2–0.9)
Monocytes Relative: 8 %
Neutro Abs: 5.7 10*3/uL (ref 1.4–6.5)
Neutrophils Relative %: 80 %
Platelets: 83 10*3/uL — ABNORMAL LOW (ref 150–440)
RBC: 2.88 MIL/uL — ABNORMAL LOW (ref 3.80–5.20)
RDW: 16.8 % — ABNORMAL HIGH (ref 11.5–14.5)
WBC: 7 10*3/uL (ref 3.6–11.0)

## 2016-10-24 LAB — MAGNESIUM: Magnesium: 1.5 mg/dL — ABNORMAL LOW (ref 1.7–2.4)

## 2016-10-24 MED ORDER — HEPARIN SOD (PORK) LOCK FLUSH 100 UNIT/ML IV SOLN
500.0000 [IU] | Freq: Once | INTRAVENOUS | Status: AC
  Administered 2016-10-24: 500 [IU] via INTRAVENOUS

## 2016-10-24 MED ORDER — MAGNESIUM SULFATE 50 % IJ SOLN: 2.0000 g | Freq: Once | INTRAMUSCULAR | Status: DC

## 2016-10-24 MED ORDER — MAGNESIUM SULFATE 2 GM/50ML IV SOLN
2.0000 g | Freq: Once | INTRAVENOUS | Status: AC
  Administered 2016-10-24: 2 g via INTRAVENOUS
  Filled 2016-10-24: qty 50

## 2016-10-24 MED ORDER — HEPARIN SOD (PORK) LOCK FLUSH 100 UNIT/ML IV SOLN
INTRAVENOUS | Status: AC
  Filled 2016-10-24: qty 5

## 2016-10-24 NOTE — Progress Notes (Signed)
Patient in clinic today with platelets of 83, discussed with Dr. Grayland Ormond, ordered to continue lovenox and recheck labs on Monday 10-31-16. Informed patient of decision, voiced understanding. Patient instructed to monitor for bleeding and bleeding precautions, instructed if bleeding was noticed while on vacation to stop the lovenox and go to the nearest ED. Voiced understanding.

## 2016-10-31 ENCOUNTER — Other Ambulatory Visit: Payer: Self-pay | Admitting: Hematology and Oncology

## 2016-10-31 ENCOUNTER — Inpatient Hospital Stay: Payer: Medicare Other

## 2016-10-31 ENCOUNTER — Inpatient Hospital Stay: Payer: Medicare Other | Attending: Hematology and Oncology | Admitting: *Deleted

## 2016-10-31 DIAGNOSIS — C786 Secondary malignant neoplasm of retroperitoneum and peritoneum: Secondary | ICD-10-CM | POA: Insufficient documentation

## 2016-10-31 DIAGNOSIS — Z66 Do not resuscitate: Secondary | ICD-10-CM | POA: Insufficient documentation

## 2016-10-31 DIAGNOSIS — Z90710 Acquired absence of both cervix and uterus: Secondary | ICD-10-CM | POA: Diagnosis not present

## 2016-10-31 DIAGNOSIS — J90 Pleural effusion, not elsewhere classified: Secondary | ICD-10-CM | POA: Diagnosis not present

## 2016-10-31 DIAGNOSIS — E785 Hyperlipidemia, unspecified: Secondary | ICD-10-CM | POA: Insufficient documentation

## 2016-10-31 DIAGNOSIS — Z8719 Personal history of other diseases of the digestive system: Secondary | ICD-10-CM | POA: Insufficient documentation

## 2016-10-31 DIAGNOSIS — C801 Malignant (primary) neoplasm, unspecified: Secondary | ICD-10-CM

## 2016-10-31 DIAGNOSIS — M069 Rheumatoid arthritis, unspecified: Secondary | ICD-10-CM | POA: Diagnosis not present

## 2016-10-31 DIAGNOSIS — Z7901 Long term (current) use of anticoagulants: Secondary | ICD-10-CM | POA: Insufficient documentation

## 2016-10-31 DIAGNOSIS — Z7189 Other specified counseling: Secondary | ICD-10-CM

## 2016-10-31 DIAGNOSIS — I2510 Atherosclerotic heart disease of native coronary artery without angina pectoris: Secondary | ICD-10-CM | POA: Diagnosis not present

## 2016-10-31 DIAGNOSIS — Z86718 Personal history of other venous thrombosis and embolism: Secondary | ICD-10-CM | POA: Insufficient documentation

## 2016-10-31 DIAGNOSIS — C569 Malignant neoplasm of unspecified ovary: Secondary | ICD-10-CM | POA: Insufficient documentation

## 2016-10-31 DIAGNOSIS — I252 Old myocardial infarction: Secondary | ICD-10-CM | POA: Diagnosis not present

## 2016-10-31 DIAGNOSIS — E876 Hypokalemia: Secondary | ICD-10-CM | POA: Insufficient documentation

## 2016-10-31 DIAGNOSIS — I110 Hypertensive heart disease with heart failure: Secondary | ICD-10-CM | POA: Diagnosis not present

## 2016-10-31 DIAGNOSIS — K449 Diaphragmatic hernia without obstruction or gangrene: Secondary | ICD-10-CM | POA: Insufficient documentation

## 2016-10-31 DIAGNOSIS — D6481 Anemia due to antineoplastic chemotherapy: Secondary | ICD-10-CM | POA: Insufficient documentation

## 2016-10-31 DIAGNOSIS — G629 Polyneuropathy, unspecified: Secondary | ICD-10-CM | POA: Diagnosis not present

## 2016-10-31 DIAGNOSIS — Z79899 Other long term (current) drug therapy: Secondary | ICD-10-CM | POA: Diagnosis not present

## 2016-10-31 DIAGNOSIS — Z794 Long term (current) use of insulin: Secondary | ICD-10-CM | POA: Diagnosis not present

## 2016-10-31 DIAGNOSIS — Z5111 Encounter for antineoplastic chemotherapy: Secondary | ICD-10-CM

## 2016-10-31 DIAGNOSIS — Z87891 Personal history of nicotine dependence: Secondary | ICD-10-CM | POA: Insufficient documentation

## 2016-10-31 DIAGNOSIS — C7800 Secondary malignant neoplasm of unspecified lung: Secondary | ICD-10-CM | POA: Insufficient documentation

## 2016-10-31 DIAGNOSIS — Z87442 Personal history of urinary calculi: Secondary | ICD-10-CM | POA: Insufficient documentation

## 2016-10-31 DIAGNOSIS — Z9221 Personal history of antineoplastic chemotherapy: Secondary | ICD-10-CM | POA: Diagnosis not present

## 2016-10-31 DIAGNOSIS — D649 Anemia, unspecified: Secondary | ICD-10-CM

## 2016-10-31 DIAGNOSIS — Z90722 Acquired absence of ovaries, bilateral: Secondary | ICD-10-CM | POA: Insufficient documentation

## 2016-10-31 DIAGNOSIS — I5032 Chronic diastolic (congestive) heart failure: Secondary | ICD-10-CM | POA: Insufficient documentation

## 2016-10-31 DIAGNOSIS — K219 Gastro-esophageal reflux disease without esophagitis: Secondary | ICD-10-CM | POA: Insufficient documentation

## 2016-10-31 DIAGNOSIS — Z7952 Long term (current) use of systemic steroids: Secondary | ICD-10-CM | POA: Insufficient documentation

## 2016-10-31 DIAGNOSIS — Z8673 Personal history of transient ischemic attack (TIA), and cerebral infarction without residual deficits: Secondary | ICD-10-CM | POA: Insufficient documentation

## 2016-10-31 DIAGNOSIS — E108 Type 1 diabetes mellitus with unspecified complications: Secondary | ICD-10-CM | POA: Insufficient documentation

## 2016-10-31 LAB — CBC WITH DIFFERENTIAL/PLATELET
BASOS PCT: 1 %
Basophils Absolute: 0.1 10*3/uL (ref 0–0.1)
EOS ABS: 0.1 10*3/uL (ref 0–0.7)
EOS PCT: 1 %
HCT: 26.9 % — ABNORMAL LOW (ref 35.0–47.0)
HEMOGLOBIN: 9.3 g/dL — AB (ref 12.0–16.0)
Lymphocytes Relative: 7 %
Lymphs Abs: 0.7 10*3/uL — ABNORMAL LOW (ref 1.0–3.6)
MCH: 30.9 pg (ref 26.0–34.0)
MCHC: 34.6 g/dL (ref 32.0–36.0)
MCV: 89.2 fL (ref 80.0–100.0)
Monocytes Absolute: 0.9 10*3/uL (ref 0.2–0.9)
Monocytes Relative: 9 %
NEUTROS PCT: 82 %
Neutro Abs: 7.9 10*3/uL — ABNORMAL HIGH (ref 1.4–6.5)
PLATELETS: 451 10*3/uL — AB (ref 150–440)
RBC: 3.01 MIL/uL — AB (ref 3.80–5.20)
RDW: 20 % — ABNORMAL HIGH (ref 11.5–14.5)
WBC: 9.6 10*3/uL (ref 3.6–11.0)

## 2016-10-31 LAB — MAGNESIUM: MAGNESIUM: 1.3 mg/dL — AB (ref 1.7–2.4)

## 2016-10-31 MED ORDER — HEPARIN SOD (PORK) LOCK FLUSH 100 UNIT/ML IV SOLN
500.0000 [IU] | Freq: Once | INTRAVENOUS | Status: AC
Start: 2016-10-31 — End: 2016-10-31
  Administered 2016-10-31: 500 [IU] via INTRAVENOUS

## 2016-10-31 MED ORDER — MAGNESIUM SULFATE 4 GM/100ML IV SOLN: 4.0000 g | Freq: Once | INTRAVENOUS | Status: DC

## 2016-10-31 MED ORDER — STERILE WATER FOR INJECTION IJ SOLN
Freq: Once | INTRAMUSCULAR | Status: AC
  Administered 2016-10-31: 12:00:00 via INTRAVENOUS
  Filled 2016-10-31: qty 50

## 2016-10-31 NOTE — Telephone Encounter (Signed)
Dx:  Peritoneal carcinomatosis ([place]); [place]...   Ref Range & Units 3wk ago  Potassium 3.5 - 5.1 mmol/L 3.8

## 2016-11-03 ENCOUNTER — Inpatient Hospital Stay: Payer: Medicare Other

## 2016-11-03 ENCOUNTER — Ambulatory Visit
Admission: RE | Admit: 2016-11-03 | Discharge: 2016-11-03 | Disposition: A | Discharge status: Home / Self Care | Payer: Medicare Other | Source: Ambulatory Visit | Attending: Hematology and Oncology | Admitting: Hematology and Oncology

## 2016-11-03 DIAGNOSIS — Z5111 Encounter for antineoplastic chemotherapy: Secondary | ICD-10-CM

## 2016-11-03 DIAGNOSIS — C7989 Secondary malignant neoplasm of other specified sites: Secondary | ICD-10-CM | POA: Diagnosis not present

## 2016-11-03 DIAGNOSIS — Z90722 Acquired absence of ovaries, bilateral: Secondary | ICD-10-CM | POA: Insufficient documentation

## 2016-11-03 DIAGNOSIS — Z9079 Acquired absence of other genital organ(s): Secondary | ICD-10-CM | POA: Diagnosis not present

## 2016-11-03 DIAGNOSIS — Z90710 Acquired absence of both cervix and uterus: Secondary | ICD-10-CM | POA: Insufficient documentation

## 2016-11-03 DIAGNOSIS — Z95828 Presence of other vascular implants and grafts: Secondary | ICD-10-CM

## 2016-11-03 DIAGNOSIS — C569 Malignant neoplasm of unspecified ovary: Secondary | ICD-10-CM | POA: Diagnosis present

## 2016-11-03 MED ORDER — IOPAMIDOL (ISOVUE-300) INJECTION 61%
100.0000 mL | Freq: Once | INTRAVENOUS | Status: AC | PRN
  Administered 2016-11-03: 100 mL via INTRAVENOUS

## 2016-11-03 MED ORDER — SODIUM CHLORIDE 0.9% FLUSH
10.0000 mL | INTRAVENOUS | Status: DC | PRN
  Administered 2016-11-03: 10 mL via INTRAVENOUS
  Filled 2016-11-03: qty 10

## 2016-11-03 MED ORDER — HEPARIN SOD (PORK) LOCK FLUSH 100 UNIT/ML IV SOLN
500.0000 [IU] | Freq: Once | INTRAVENOUS | Status: AC
Start: 2016-11-03 — End: 2016-11-03
  Administered 2016-11-03: 500 [IU] via INTRAVENOUS

## 2016-11-04 ENCOUNTER — Other Ambulatory Visit: Payer: Self-pay | Admitting: Family Medicine

## 2016-11-07 ENCOUNTER — Inpatient Hospital Stay: Payer: Medicare Other

## 2016-11-07 ENCOUNTER — Inpatient Hospital Stay (HOSPITAL_BASED_OUTPATIENT_CLINIC_OR_DEPARTMENT_OTHER): Payer: Medicare Other | Admitting: Hematology and Oncology

## 2016-11-07 ENCOUNTER — Other Ambulatory Visit: Payer: Self-pay

## 2016-11-07 ENCOUNTER — Encounter: Payer: Self-pay | Admitting: Hematology and Oncology

## 2016-11-07 ENCOUNTER — Other Ambulatory Visit: Payer: Self-pay | Admitting: Hematology and Oncology

## 2016-11-07 VITALS — BP 105/66 | HR 85 | Temp 97.8°F | Resp 18 | Wt 136.0 lb

## 2016-11-07 DIAGNOSIS — G629 Polyneuropathy, unspecified: Secondary | ICD-10-CM

## 2016-11-07 DIAGNOSIS — C801 Malignant (primary) neoplasm, unspecified: Secondary | ICD-10-CM

## 2016-11-07 DIAGNOSIS — Z66 Do not resuscitate: Secondary | ICD-10-CM

## 2016-11-07 DIAGNOSIS — K219 Gastro-esophageal reflux disease without esophagitis: Secondary | ICD-10-CM | POA: Diagnosis not present

## 2016-11-07 DIAGNOSIS — Z87442 Personal history of urinary calculi: Secondary | ICD-10-CM

## 2016-11-07 DIAGNOSIS — C569 Malignant neoplasm of unspecified ovary: Secondary | ICD-10-CM

## 2016-11-07 DIAGNOSIS — Z7951 Long term (current) use of inhaled steroids: Secondary | ICD-10-CM

## 2016-11-07 DIAGNOSIS — Z7189 Other specified counseling: Secondary | ICD-10-CM

## 2016-11-07 DIAGNOSIS — T451X5A Adverse effect of antineoplastic and immunosuppressive drugs, initial encounter: Secondary | ICD-10-CM

## 2016-11-07 DIAGNOSIS — E785 Hyperlipidemia, unspecified: Secondary | ICD-10-CM

## 2016-11-07 DIAGNOSIS — I2510 Atherosclerotic heart disease of native coronary artery without angina pectoris: Secondary | ICD-10-CM

## 2016-11-07 DIAGNOSIS — I5032 Chronic diastolic (congestive) heart failure: Secondary | ICD-10-CM | POA: Diagnosis not present

## 2016-11-07 DIAGNOSIS — Z794 Long term (current) use of insulin: Secondary | ICD-10-CM

## 2016-11-07 DIAGNOSIS — Z9221 Personal history of antineoplastic chemotherapy: Secondary | ICD-10-CM

## 2016-11-07 DIAGNOSIS — D6481 Anemia due to antineoplastic chemotherapy: Secondary | ICD-10-CM | POA: Diagnosis not present

## 2016-11-07 DIAGNOSIS — E876 Hypokalemia: Secondary | ICD-10-CM

## 2016-11-07 DIAGNOSIS — I110 Hypertensive heart disease with heart failure: Secondary | ICD-10-CM

## 2016-11-07 DIAGNOSIS — M069 Rheumatoid arthritis, unspecified: Secondary | ICD-10-CM | POA: Diagnosis not present

## 2016-11-07 DIAGNOSIS — Z7901 Long term (current) use of anticoagulants: Secondary | ICD-10-CM

## 2016-11-07 DIAGNOSIS — Z5111 Encounter for antineoplastic chemotherapy: Secondary | ICD-10-CM

## 2016-11-07 DIAGNOSIS — J90 Pleural effusion, not elsewhere classified: Secondary | ICD-10-CM

## 2016-11-07 DIAGNOSIS — C7800 Secondary malignant neoplasm of unspecified lung: Secondary | ICD-10-CM

## 2016-11-07 DIAGNOSIS — I252 Old myocardial infarction: Secondary | ICD-10-CM

## 2016-11-07 DIAGNOSIS — Z8719 Personal history of other diseases of the digestive system: Secondary | ICD-10-CM

## 2016-11-07 DIAGNOSIS — Z90722 Acquired absence of ovaries, bilateral: Secondary | ICD-10-CM

## 2016-11-07 DIAGNOSIS — Z90710 Acquired absence of both cervix and uterus: Secondary | ICD-10-CM

## 2016-11-07 DIAGNOSIS — Z8673 Personal history of transient ischemic attack (TIA), and cerebral infarction without residual deficits: Secondary | ICD-10-CM

## 2016-11-07 DIAGNOSIS — Z79899 Other long term (current) drug therapy: Secondary | ICD-10-CM | POA: Diagnosis not present

## 2016-11-07 DIAGNOSIS — C786 Secondary malignant neoplasm of retroperitoneum and peritoneum: Secondary | ICD-10-CM

## 2016-11-07 DIAGNOSIS — E108 Type 1 diabetes mellitus with unspecified complications: Secondary | ICD-10-CM

## 2016-11-07 DIAGNOSIS — Z86718 Personal history of other venous thrombosis and embolism: Secondary | ICD-10-CM

## 2016-11-07 DIAGNOSIS — K449 Diaphragmatic hernia without obstruction or gangrene: Secondary | ICD-10-CM

## 2016-11-07 DIAGNOSIS — Z87891 Personal history of nicotine dependence: Secondary | ICD-10-CM

## 2016-11-07 LAB — COMPREHENSIVE METABOLIC PANEL
ALT: 12 U/L — ABNORMAL LOW (ref 14–54)
AST: 15 U/L (ref 15–41)
Albumin: 3.2 g/dL — ABNORMAL LOW (ref 3.5–5.0)
Alkaline Phosphatase: 133 U/L — ABNORMAL HIGH (ref 38–126)
Anion gap: 9 (ref 5–15)
BUN: 21 mg/dL — ABNORMAL HIGH (ref 6–20)
CO2: 27 mmol/L (ref 22–32)
Calcium: 9.1 mg/dL (ref 8.9–10.3)
Chloride: 100 mmol/L — ABNORMAL LOW (ref 101–111)
Creatinine, Ser: 0.75 mg/dL (ref 0.44–1.00)
GFR calc Af Amer: >60 mL/min (ref >60)
GFR calc non Af Amer: >60 mL/min (ref >60)
Glucose, Bld: 187 mg/dL — ABNORMAL HIGH (ref 65–99)
Potassium: 4 mmol/L (ref 3.5–5.1)
Sodium: 136 mmol/L (ref 135–145)
Total Bilirubin: 0.6 mg/dL (ref 0.3–1.2)
Total Protein: 7 g/dL (ref 6.5–8.1)

## 2016-11-07 LAB — CBC WITH DIFFERENTIAL/PLATELET
Basophils Absolute: 0.1 10*3/uL (ref 0–0.1)
Basophils Relative: 1 %
Eosinophils Absolute: 0.1 10*3/uL (ref 0–0.7)
Eosinophils Relative: 1 %
HCT: 25.7 % — ABNORMAL LOW (ref 35.0–47.0)
Hemoglobin: 9 g/dL — ABNORMAL LOW (ref 12.0–16.0)
Lymphocytes Relative: 11 %
Lymphs Abs: 0.5 10*3/uL — ABNORMAL LOW (ref 1.0–3.6)
MCH: 31.4 pg (ref 26.0–34.0)
MCHC: 35.1 g/dL (ref 32.0–36.0)
MCV: 89.5 fL (ref 80.0–100.0)
Monocytes Absolute: 0.8 10*3/uL (ref 0.2–0.9)
Monocytes Relative: 17 %
Neutro Abs: 3.3 10*3/uL (ref 1.4–6.5)
Neutrophils Relative %: 70 %
Platelets: 303 10*3/uL (ref 150–440)
RBC: 2.87 MIL/uL — ABNORMAL LOW (ref 3.80–5.20)
RDW: 19.2 % — ABNORMAL HIGH (ref 11.5–14.5)
WBC: 4.8 10*3/uL (ref 3.6–11.0)

## 2016-11-07 LAB — FERRITIN: Ferritin: 206 ng/mL (ref 11–307)

## 2016-11-07 LAB — MAGNESIUM: Magnesium: 1.4 mg/dL — ABNORMAL LOW (ref 1.7–2.4)

## 2016-11-07 MED ORDER — SODIUM CHLORIDE 0.9% FLUSH
10.0000 mL | INTRAVENOUS | Status: DC | PRN
  Administered 2016-11-07: 10 mL via INTRAVENOUS
  Filled 2016-11-07: qty 10

## 2016-11-07 MED ORDER — STERILE WATER FOR INJECTION IJ SOLN
Freq: Once | INTRAMUSCULAR | Status: AC
  Administered 2016-11-07: 10:00:00 via INTRAVENOUS
  Filled 2016-11-07: qty 50

## 2016-11-07 MED ORDER — EPOETIN ALFA 10000 UNIT/ML IJ SOLN
10000.0000 [IU] | Freq: Once | INTRAMUSCULAR | Status: AC
  Administered 2016-11-07: 10000 [IU] via SUBCUTANEOUS

## 2016-11-07 MED ORDER — HEPARIN SOD (PORK) LOCK FLUSH 100 UNIT/ML IV SOLN
500.0000 [IU] | Freq: Once | INTRAVENOUS | Status: AC
  Administered 2016-11-07: 500 [IU] via INTRAVENOUS

## 2016-11-07 MED ORDER — MAGNESIUM SULFATE 4 GM/100ML IV SOLN: 4.0000 g | Freq: Once | INTRAVENOUS | Status: DC

## 2016-11-07 NOTE — Progress Notes (Signed)
Patient offers no complaints today.  Here today for CT results. 

## 2016-11-07 NOTE — Progress Notes (Signed)
[facility] day:  11/07/2016   Chief Complaint: [name] is a 71 y.o. female with stage IV ovarian cancer who is seen for review of interval scans and assessment prior to cycle #4 topotecan.  HPI:  The patient was last seen in the medical oncology clinic on 10/10/2016.  At that time, she felt "ok".  Exam was stable.  CA125 was decreasing.  She had chronic hypomagnesemia (1.2) secondary to carboplatin. She began cycle #3 topotecan wit Neulasta support.  She continued weekly IV magnesium supplementation.  ANC nadir was 1700.  Platelet count nadir was 83,000.  Abdomen and pelvic CT scan on 11/03/2016 revealed multifocal cystic metastases in the abdomen/pelvis, reflecting peritoneal disease.  The dominant lesion in the left anterior abdomen has progressed, while additional lesions were mixed.  Peritoneal disease along the liver and spleen progressed.  Pleural-based metastases along the posterior right hemithorax progressed. There were trace left pleural effusion.  There was a stable 1.7 cm duodenal lesion at the ampulla.  Symptomatically, she feels wonderful.  She denies any concerns today while in the clinic.    Past Medical History:  Diagnosis Date  . CAD (coronary artery disease)    a. 09/2012 Cath: LM nl, LAD 95p, 62m LCX 925mOM2 50, RCA 100.  . Marland Kitchenholelithiasis   . Chronic diastolic CHF (congestive heart failure) ([facility]  . Collagen vascular disease (HCC)    Rheumatoid Arthritis.  . Esophageal stricture   . Exertional shortness of breath   . GERD (gastroesophageal reflux disease)   . H/O hiatal hernia   . Herniated disc   . History of pancytopenia   . Hyperlipidemia   . Hypertension   . Hypokalemia   . Leukopenia 2012   s/p bone marrow biopsy, Dr. PaMa Hillock. NSTEMI (non-ST elevated myocardial infarction) ([facility]/2014   "mild" (10/18/2012)  . Ovarian cancer (HCL[place]eds2016   chemo and hysterectomy  . Pneumonia 2013; 08/2012   "one lung;  double" (10/18/2012)  . Rheumatoid arthritis(714.0)   . Type I diabetes mellitus ([place]   "dx'd in 1957" (10/18/2012)    Past Surgical History:  Procedure Laterality Date  . ABDOMINAL HYSTERECTOMY  06/15/2015   Dx L/S, EXLAP TAH BSO omentectomy RSRx colostomy diaphragm resection stripping  . CARDIAC CATHETERIZATION  10/18/2012   "first one was today" (10/18/2012)  . CATARACT EXTRACTION W/ INTRAOCULAR LENS IMPLANT Right 2010  . CHOLECYSTECTOMY  06/15/2015   combined case with ovarian cancer debulking  . COLON SURGERY    . CORONARY ARTERY BYPASS GRAFT N/A 10/19/2012   Procedure: CORONARY ARTERY BYPASS GRAFTING (CABG);  Surgeon: StMelrose NakayamaMD;  [facility];  Laterality: N/A;  . ESOPHAGEAL DILATION     "3 or 4 times" (10/19/2011)  . ESOPHAGOGASTRODUODENOSCOPY  2012   Dr. IfMinna Merritts. OSTOMY    . OVARY SURGERY     removal  . PERIPHERAL VASCULAR CATHETERIZATION N/A 03/02/2015   Procedure: PoGlori Luisath Insertion;  Surgeon: JaAlgernon HuxleyMD;  [place] [facility];  Service: Cardiovascular;  Laterality: N/A;  . TUBAL LIGATION  1970    Family History  Problem Relation Age of Onset  . Diabetes Mother   . Arthritis Mother   . Diabetes Father   . Arthritis Father   . Bone cancer Sister     Social History:  reports that she quit smoking about 22 years ago. Her smoking use included  Cigarettes. She has a 30.00 pack-year smoking history. She has never used smokeless tobacco. She reports that she does not drink alcohol or use drugs.  She is accompanied by her husband today.  Allergies:  Allergies  Allergen Reactions  . Codeine Nausea And Vomiting  . Latex Rash    Current Medications: Current Outpatient Prescriptions  Medication Sig Dispense Refill  . Calcium-Vitamin D 600-200 MG-UNIT tablet Take 1 tablet by mouth 2 (two) times daily.     . cholecalciferol (VITAMIN D) 1000 units tablet Take 1,000 Units by mouth daily.    Marland Kitchen enoxaparin  (LOVENOX) 60 MG/0.6ML injection Inject 60 mg into the skin 2 (two) times daily.    . folic acid (FOLVITE) 1 MG tablet Take 1 tablet (1 mg total) by mouth daily. 90 tablet 3  . insulin lispro (HUMALOG) 100 UNIT/ML injection Inject 0.06 mLs (6 Units total) into the skin daily. (Patient taking differently: Inject 0-85 Units into the skin daily. Via insulin pump) 30 mL 3  . lidocaine-prilocaine (EMLA) cream APPLY 1 APPLICATION TOPICALLY AS NEEDED 1 HOUR PRIOR TO TREATMENT. COVER WITH PRESS N SEAL UNTIL TREATMENT TIME AS DIRECTED 30 g 1  . loratadine (CLARITIN) 10 MG tablet Take 1 tablet by mouth daily as needed.    . magnesium oxide (MAG-OX) 400 (241.3 Mg) MG tablet TAKE 1 TABLET BY MOUTH ONCE DAILY 30 tablet 1  . metoprolol tartrate (LOPRESSOR) 25 MG tablet Take 1 tablet (25 mg total) by mouth 2 (two) times daily. 180 tablet 3  . ondansetron (ZOFRAN) 8 MG tablet TAKE 1 TABLET BY MOUTH EVERY 8 HOURS AS NEEDED FOR NAUSEA OR VOMITING 30 tablet 1  . pantoprazole (PROTONIX) 40 MG tablet TAKE 1 TABLET BY MOUTH TWICE (2) DAILY 180 tablet 1  . potassium chloride SA (K-DUR,KLOR-CON) 20 MEQ tablet TAKE 1 TABLET BY MOUTH DAILY 30 tablet 2  . predniSONE (DELTASONE) 5 MG tablet Take 5 mg by mouth daily with breakfast.    . vitamin C (ASCORBIC ACID) 500 MG tablet Take 500 mg by mouth daily.    Marland Kitchen zinc gluconate 50 MG tablet Take 50 mg by mouth daily.    . furosemide (LASIX) 80 MG tablet Take 0.5 tablets by mouth 2 (two) times daily.     Current Facility-Administered Medications  Medication Dose Route Frequency Provider Last Rate Last Dose  . Tbo-Filgrastim (GRANIX) injection 300 mcg  300 mcg Subcutaneous Once Lequita Asal, MD       Facility-Administered Medications Ordered in Other Visits  Medication Dose Route Frequency Provider Last Rate Last Dose  . sodium chloride 0.9 % injection 10 mL  10 mL Intracatheter PRN Corcoran, Melissa C, MD      . sodium chloride 0.9 % injection 10 mL  10 mL Intravenous PRN  Lequita Asal, MD   10 mL at 04/13/15 9935    Review of Systems:  GENERAL:  Feels "wonderful".  No fever, chills or sweats.  Weight down 1 pound. PERFORMANCE STATUS (ECOG):  1 HEENT:  No visual changes, sore throat, mouth sores or tenderness. Lungs:  No shortness of breath or cough.  No hemoptysis. Cardiac:  No chest pain, palpitations, orthopnea, or PND.  Took BP pill this AM. GI:  No abdominal pain.  No nausea, vomiting, diarrhea, melena or hematochezia. GU:  No urgency, frequency, dysuria, or hematuria. Musculoskeletal:  Severe rheumatoid arthritis (off MTX and on prednisone).  Osteoporosis.  No back pain. No muscle tenderness. Extremities:  No pain or swelling. Skin:  No increased bruising or bleeding.  No rashes or skin changes. Neuro:  Neuropathy (stable).  No headache, numbness or weakness, balance or coordination issues. Endocrine:  Diabetes on an insulin pump.  Occasional hot flashes.  Psych:  No mood changes, depression or anxiety. Pain: No pain. Review of systems:  All other systems reviewed and found to be negative.  Physical Exam: 137 Blood pressure 105/66, pulse 85, temperature 97.8 F (36.6 C), temperature source Tympanic, resp. rate 18, weight 136 lb (61.7 kg). GENERAL:  Well developed, well nourished woman sitting comfortably in the exam room in no acute distress. MENTAL STATUS:  Alert and oriented to person, place and time. HEAD:  Wearing a pink cap.  Alopecia.  Normocephalic, atraumatic, face symmetric, no Cushingoid features. EYES:  Gold rimmed glasses.  Blue eyes.  No conjunctivitis or scleral icterus.  ENT: Oropharynx clear without lesion. Tongue normal. Mucous membranes moist.  RESPIRATORY: Clear to auscultation without rales, wheezes or rhonchi. CARDIOVASCULAR: Regular rate and rhythm without murmur, rub or gallop. ABDOMEN: Soft, non-tender with active bowel sounds and no hepatosplenomegaly. No palpable nodularity or masses.  Insulin pump.  Ostomy  bag. SKIN: Slight bruising on abdomen s/p Lovenox injections. RLE bruise.  No rashes or ulcers. EXTREMITIES:  No edema, skin discoloration or tenderness. No palpable cords. LYMPH NODES: No palpable cervical, supraclavicular, axillary or inguinal adenopathy  NEUROLOGICAL: Appropriate. PSYCH: Appropriate.   Radiology studies: 02/13/2015:  Abdomen and pelvic CT revealed bilateral mass-like adnexal regions (right adnexal mass 5.6 x 5.0 cm and the left adnexa mass 10.3 x 6.0 x 9.9 cm).  There was a large amount of soft tissue throughout the peritoneal cavity involving the omentum and other peritoneal surfaces.  There was a small volume ascites. There was a peripheral 3.3 x 1.9 cm low-attenuation lesion overlying the right lobe of the liver, likely representing a serosal implant. There was 1.4 x 2.2 cm ill-defined peripheral lesion within the inferior aspect of segment 6 adjacent to the ampulla in the duodenum.  04/28/2015:  Abdominal and pelvic CT revealed decreasing bilateral ovarian masses.   The left adnexal mass measured 4.0 x 6.1 cm (previously 5.0 x 8.5 cm). The right ovary measured 3.8 x 4.9 cm (previously 4.7 x 5.6 cm).  There was improved peritoneal carcinomatosis.  There was a small amount of ascites.  The hepatic dome lesion was stable. The previously seen right hepatic lobe lesion was not well-visualized.  There was a small left pleural effusion and trace right pleural effusion.  The nodular lesion at the ampulla of Vater, extending into the duodenum was stable.  There was no biliary ductal dilatation. 08/01/2015:  Rght upper extremity ultrasound revealed a near occlusive thrombus within the central portion of the right internal jugular vein and central portion of the right subclavian vein. 09/01/2015:  Chest, abdomen, and pelvic CT revealed continued decrease in perihepatic fluid collection contiguous with the right pleural space with percutaneous drain.  09/11/2015:  Chest, abdomen, and  pelvic CT revealed a residual versus recurrent fluid collection posterior to the right hepatic lobe (2.6 x 1.1 x 4.0 cm).   10/13/2015:  Chest, abdomen, and pelvic CT revealed resolution of the empyema. 02/15/2016:  Chest, abdomen, and pelvic CT revealed multiple soft tissue nodules throughout the pelvis, small bowel mesenteric and peritoneum and, concerning for peritoneal metastatic disease.  There was soft tissue irregularity within the left upper quadrant.  There was mild right hydronephrosis (etiology unclear).  There was interval resolution of previously described right pleural based  fluid and gas collection. There was a pleural based nodule within the right lower hemi-thorax concerning for pleural based metastasis.  There were small bilateral pleural effusions.  There was pulmonary nodularity, predominately within the left upper lobe (metastatic or infectious/inflammatory etiology).  There was slightly increased mediastinal adenopathy (infectious/inflammatory or metastatic).  There was a low attenuation lesion within the left hepatic lobe (complicated fluid within the fissure or metastatic disease). 03/08/2016:  Abdomen and pelvic CT revealed mild progression of peritoneal disease in the abdomen.  There was interval increase in loculated fluid around the lateral segment left liver, stomach, and spleen.  There was persistent soft tissue lesion at the level of the ampulla with mild intra and extrahepatic biliary duct dilatation.  There was persistent intrahepatic and capsular metastatic disease involving the liver. 05/26/2016:  Head MRI revealed multiple small areas of acute infarct involving the occipital parietal lobe bilaterally and the left lateral cerebellum,  consistent with posterior circulation emboli.  06/10/2016:  Adomen and pelvic CT revealed progression of peritoneal carcinomatosis predominantly in the perihepatic space, bilateral lower quadrants and pelvis.  There was worsening bilateral  obstructive uropathy due to malignant involvement of the pelvic ureters.  There was stable small pleural/subpleural nodules at the right lung base.  There was stable small left pleural effusion.  There was decreased small volume perihepatic ascites.  08/09/2016:  Abdomen and pelvic CT revealed interval increase and loculated fluid collections within the peritoneal space consistent with progression of peritoneal ovarian carcinoma metastasis.  There was one large 7 cm collection in the RIGHT lower quadrant which had increased significantly in size and may represent of point of small bowel obstruction.  The proximal stomach and duodenum were decompressed. The mid small bowel was mildly dilated. There was concern for obstruction given the large intraperitoneal fluid collection.  There was increase in subcapsular hepatic metastatic fluid collections.  There was enlargement of rounded ampullary lesion in the second portion duodenum concerning for metastatic lesion.  There was mild biliary duct dilatation (stable).  There was nodular pleural metastasis in the RIGHT lower lobe pleural space. 11/03/2016:  Abdomen and pelvic CT revealed multifocal cystic metastases in the abdomen/pelvis, reflecting peritoneal disease.  The dominant lesion in the left anterior abdomen has progressed, while additional lesions were mixed.  Peritoneal disease along the liver and spleen progressed.  Pleural-based metastases along the posterior right hemithorax progressed. There were trace left pleural effusion.  There was a stable 1.7 cm duodenal lesion at the ampulla.   Orders Only on 11/07/2016  Component Date Value Ref Range Status  . Ferritin 11/07/2016 206  11 - 307 ng/mL Final  Infusion on 11/07/2016  Component Date Value Ref Range Status  . WBC 11/07/2016 4.8  3.6 - 11.0 K/uL Final  . RBC 11/07/2016 2.87* 3.80 - 5.20 MIL/uL Final  . Hemoglobin 11/07/2016 9.0* 12.0 - 16.0 g/dL Final  . HCT 11/07/2016 25.7* 35.0 - 47.0 % Final   . MCV 11/07/2016 89.5  80.0 - 100.0 fL Final  . MCH 11/07/2016 31.4  26.0 - 34.0 pg Final  . MCHC 11/07/2016 35.1  32.0 - 36.0 g/dL Final  . RDW 11/07/2016 19.2* 11.5 - 14.5 % Final  . Platelets 11/07/2016 303  150 - 440 K/uL Final  . Neutrophils Relative % 11/07/2016 70  % Final  . Neutro Abs 11/07/2016 3.3  1.4 - 6.5 K/uL Final  . Lymphocytes Relative 11/07/2016 11  % Final  . Lymphs Abs 11/07/2016 0.5* 1.0 - 3.6 K/uL Final  .  Monocytes Relative 11/07/2016 17  % Final  . Monocytes Absolute 11/07/2016 0.8  0.2 - 0.9 K/uL Final  . Eosinophils Relative 11/07/2016 1  % Final  . Eosinophils Absolute 11/07/2016 0.1  0 - 0.7 K/uL Final  . Basophils Relative 11/07/2016 1  % Final  . Basophils Absolute 11/07/2016 0.1  0 - 0.1 K/uL Final  . Sodium 11/07/2016 136  135 - 145 mmol/L Final  . Potassium 11/07/2016 4.0  3.5 - 5.1 mmol/L Final  . Chloride 11/07/2016 100* 101 - 111 mmol/L Final  . CO2 11/07/2016 27  22 - 32 mmol/L Final  . Glucose, Bld 11/07/2016 187* 65 - 99 mg/dL Final  . BUN 11/07/2016 21* 6 - 20 mg/dL Final  . Creatinine, Ser 11/07/2016 0.75  0.44 - 1.00 mg/dL Final  . Calcium 11/07/2016 9.1  8.9 - 10.3 mg/dL Final  . Total Protein 11/07/2016 7.0  6.5 - 8.1 g/dL Final  . Albumin 11/07/2016 3.2* 3.5 - 5.0 g/dL Final  . AST 11/07/2016 15  15 - 41 U/L Final  . ALT 11/07/2016 12* 14 - 54 U/L Final  . Alkaline Phosphatase 11/07/2016 133* 38 - 126 U/L Final  . Total Bilirubin 11/07/2016 0.6  0.3 - 1.2 mg/dL Final  . GFR calc non Af Amer 11/07/2016 >60  >60 mL/min Final  . GFR calc Af Amer 11/07/2016 >60  >60 mL/min Final   Comment: (NOTE) The eGFR has been calculated using the CKD EPI equation. This calculation has not been validated in all clinical situations. eGFR's persistently <60 mL/min signify possible Chronic Kidney Disease.   . Anion gap 11/07/2016 9  5 - 15 Final  . Magnesium 11/07/2016 1.4* 1.7 - 2.4 mg/dL Final    Assessment:  TAWYNA PELLOT is a 71 y.o. female  with progressive stage IV ovarian cancer.  She presented with abdominal discomfort and bloating.  Omental biopsy on 02/23/2015 revealed metastatic high grade serous carcinoma, consistent with gynecologic origin.   She was initially diagnosed with clinical stage IIIC (T3cN1Mx).  CA125 was 707 on 02/17/2015.  She received 4 cycles of neoadjuvant carboplatin and Taxol (03/05/2015 - 05/22/2015).  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  She had loose stools on oral magnesium.  She was initially on Neurontin then switched Lyrica with cycle #3.  Cycle #4 was notable for neutropenia (ANC 300) requiring GCSF x 3 days.    She received tamoxifen from 02/25/2016 - 03/14/2016.  She received 4 cycles of carboplatin and gemcitabine (03/21/2016 - 05/24/2016) with GCSF/Neulasta support.  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  She received 2 cycles of Doxil (06/21/2016 - 07/19/2016) with Neulasta support.   CA125 was 802.9 on 03/30/2015, 567.9 on 04/13/2015, 168.8 on 05/15/2015, 85.2 on 07/17/2015, 68.6 on 07/28/2015, 34.5 on 09/17/2015, 20.7 on 11/06/2015, 49 on 01/22/2016, 106.1 on 02/22/2016, 138.8 on 03/07/2016, 220.8 on 03/21/2016, 152.8 on 04/11/2016, 125.6 on 04/18/2016, 76.8 on 05/03/2016, 60.2 on 05/24/2016, 44 on 07/19/2016, 65.8 on 08/17/2016, 53.4 on 09/12/2016, and 47.3 on 10/10/2016.  She underwent exploratory laparotomy, lysis of adhesions, total abdominal hysterectomy with bilateral salpingo-oophorectomy, infracolic omentectomy, optimal tumor debulking(< 1 cm), recto-sigmoid resection with creation of end colostomy, cholecystectomy, mobilization of splenic flexure and liver with diaphragmatic stripping on 06/15/2015. The right diaphragm was cleared of tumor. During dissection, the diaphragm was entered and closed with sutures.   She had a recurrent right sided pleural effusion.  She underwent thoracentesis of 650 cc on post-operative day 3.  She was admitted to [facility] on  07/01/2015 and 07/07/2015 for recurrent shortness of breath.  She underwent 2 additional thoracenteses (1.1 L on 07/02/2015 and 850 cc on 07/08/2015).  Cytology was negative x 2.  Bilateral lower extremity duplex on 07/03/2015 was negative.  Echo revealed an EF of 55-60% on 07/08/2015 and 60-65% on 05/27/2016.    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 She was on Lovenox 60 mg twice a day.  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  She was admitted to [facility] from 07/29/2015 - 08/10/2015 with a right-sided empyema and liver abscess. She underwent CT-guided placement of a liver abscess drain on 07/30/2015.  Liver abscess culture grew out group B strep and Enterobacter which was sensitive to Zosyn. She was transitioned to ertapenem Colbert Ewing) prior to discharge.  She was readmitted to [facility] on 09/17/2015.  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  Head MRI on 05/26/2016 revealed multiple small areas of acute infarct involving the occipital parietal lobe bilaterally and the left lateral cerebellum,  consistent with posterior circulation emboli.  Work-up included a negative carotid ultrasound and echocardiogram.  She was admitted to [facility] from 08/10/2016 - 08/14/2016 with a small bowel obstruction.  She was managed conservatively.  She has severe rheumatoid arthritis.  Methotrexate and Enbrel were initially on hold.  She has a normocytic anemia.  Work-up on 02/17/2015 and 05/24/2016 revealed a normal ferritin, B12, folate, TSH.  She denies any melena or hematochezia.    She has anemia due to chronic disease. She received 1 unit PRBCs during her admission at [facility]. She denies any melena or hematochezia. She has diabetes and is on an insulin pump.  She has  had persistent neutropenia felt secondary to her rheumatoid arthritis.  Folate and MMA were normal.  TSH was 6.13 (high) with a free T4 of 1.17 (0.61-1.12).  She began methotrexate (10 mg a week) and prednisone (5 mg a day) for severe rheumatoid arthritis on 12/17/2015.  She remains on prednisone alone (5 mg a day).  Bone marrow aspirate and biopsy on 06/09/2010 revealed a hypercellular marrow (70%) with no evidence of dysplasia or malignancy.  Flow cytometry was negative.  Cytogenetics were normal (46,XX).  FISH studies were negative for MDS.  Bone marrow aspirate and biopsy on 12/03/2015 revealed a normocellular to mildy hypercellular marrow for age (40%) with left shifted myelopoiesis, non specific dyserythropoiesis and mild megakaryocytic atypia with no increase in blasts.  There were multiple small nonspecific lymphoid aggregates (favor reactive). There was no increase in reticulin.  There was decreased myeloid cells (37%) with left shifted maturation and 1% atypical myelod blasts.  There was relatively increased monocytic cells (11%), relatively increased lymphoid cells (36%), and relatively increased eosinophils (6%).  Cytogenetics were normal (65, XX).  SNP microarray was normal.  Abdomen and pelvic CT scan on 08/09/2016 revealed interval increase and loculated fluid collections within the peritoneal space consistent with progression of peritoneal ovarian carcinoma metastasis.  There was one large 7 cm collection in the RIGHT lower quadrant which had increased significantly in size and may represent of point of small bowel obstruction.  There was increase in subcapsular hepatic metastatic fluid collections.  There was enlargement of rounded ampullary lesion in the second portion duodenum concerning for  metastatic lesion.  There was mild biliary duct dilatation (stable).  There was nodular pleural metastasis in the RIGHT lower lobe pleural space.  Abdomen and pelvic CT scan on 11/03/2016 revealed  multifocal cystic metastases in the abdomen/pelvis, reflecting peritoneal disease.  The dominant lesion in the left anterior abdomen has progressed, while additional lesions were mixed.  Peritoneal disease along the liver and spleen progressed.  Pleural-based metastases along the posterior right hemithorax progressed. There were trace left pleural effusion.  There was a stable 1.7 cm duodenal lesion at the ampulla.  She received 4 cycles of topotecan with Neulasta support (08/22/2016 - 10/10/2016).  She was diagnosed with a Serratia marcescens UTI on 09/12/2016.  She was treated with a course of ciprofloxacin.  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  She receives IV magnesium weekly.  She has chemotherapy induced anemia.  She has received Procrit (last 09/30/2016).  Labs on 08/22/2016 revealed a normal B12 and folate.  Ferritin was 37, iron saturation 11% and TIBC 281.  Code status is DNR/DNI.  Symptomatically, she denies any complaint.  Exam is stable.  Scans reveal progressive disease.  She has chemotherapy induced anemia.  She has chronic hypomagnesemia (1.4).    Plan: 1.  Labs today:  CBC with diff, CMP, Mg, CA125, Ferritin. 2.  Discontinue topotecan. 3.  Review abdomen and pelvic CT scan.  Discuss progressive disease and discontinuation of topotecan. Discussed both Keytruda and Abraxane. Patient understands that there is a potential of worsening peripheral neuropathy with the Abraxane. Will pursue PDL-1 testing on tumor. Pre-auth Abraxane.  4. Procrit injection today. 5. Magnesium 4gm infusion today. 6. RTC in 1 week for labs (BMP, Mg); +/- IV magnesium   Lequita Asal, MD  11/07/2016, 5:54 PM

## 2016-11-07 NOTE — Progress Notes (Signed)
DISCONTINUE ON PATHWAY REGIMEN - Ovarian     A cycle is every 21 days:     Topotecan   **Always confirm dose/schedule in your pharmacy ordering system**    REASON: Disease Progression PRIOR TREATMENT: [id number]: Topotecan 1.25 mg/m2 IV D1-5 q21  Days; Re-evaluate Every 3 Cycles, Treat until Complete Response, Unacceptable Toxicity, or Disease Progression TREATMENT RESPONSE: Progressive Disease (PD)    Patient Characteristics: Recurrent or Progressive Disease AJCC T Category: Staged < 8th Ed. AJCC N Category: Staged < 8th Ed. AJCC M Category: Staged < 8th Ed. Therapeutic Status: Recurrent or Progressive Disease AJCC 8 Stage Grouping: Staged < 8th Ed. BRCA Mutation Status: Absent Intent of Therapy: Non-Curative / Palliative Intent, Discussed with Patient

## 2016-11-08 ENCOUNTER — Inpatient Hospital Stay: Payer: Medicare Other

## 2016-11-08 LAB — CA 125: Cancer Antigen (CA) 125: 54.1 U/mL — ABNORMAL HIGH (ref 0.0–38.1)

## 2016-11-09 ENCOUNTER — Inpatient Hospital Stay: Payer: Medicare Other

## 2016-11-10 ENCOUNTER — Inpatient Hospital Stay: Payer: Medicare Other

## 2016-11-10 NOTE — Progress Notes (Signed)
START ON PATHWAY REGIMEN - Ovarian     Administer weekly:     Paclitaxel   **Always confirm dose/schedule in your pharmacy ordering system**    Patient Characteristics: Recurrent or Progressive Disease, Fourth Line and Beyond, BRCA Mutation Absent/Unknown AJCC T Category: Staged < 8th Ed. AJCC N Category: Staged < 8th Ed. AJCC M Category: Staged < 8th Ed. Therapeutic Status: Recurrent or Progressive Disease AJCC 8 Stage Grouping: Staged < 8th Ed. Line of Therapy: Fourth Line and Beyond BRCA Mutation Status: Absent Intent of Therapy: Non-Curative / Palliative Intent, Discussed with Patient

## 2016-11-11 ENCOUNTER — Inpatient Hospital Stay: Payer: Medicare Other

## 2016-11-13 DIAGNOSIS — D6481 Anemia due to antineoplastic chemotherapy: Secondary | ICD-10-CM | POA: Insufficient documentation

## 2016-11-13 DIAGNOSIS — T451X5A Adverse effect of antineoplastic and immunosuppressive drugs, initial encounter: Secondary | ICD-10-CM

## 2016-11-14 ENCOUNTER — Inpatient Hospital Stay: Payer: Medicare Other

## 2016-11-14 ENCOUNTER — Inpatient Hospital Stay (HOSPITAL_BASED_OUTPATIENT_CLINIC_OR_DEPARTMENT_OTHER): Payer: Medicare Other | Admitting: Hematology and Oncology

## 2016-11-14 VITALS — BP 112/61 | HR 76 | Temp 98.0°F | Resp 18 | Wt 135.5 lb

## 2016-11-14 DIAGNOSIS — Z90722 Acquired absence of ovaries, bilateral: Secondary | ICD-10-CM

## 2016-11-14 DIAGNOSIS — G629 Polyneuropathy, unspecified: Secondary | ICD-10-CM

## 2016-11-14 DIAGNOSIS — E108 Type 1 diabetes mellitus with unspecified complications: Secondary | ICD-10-CM

## 2016-11-14 DIAGNOSIS — C7800 Secondary malignant neoplasm of unspecified lung: Secondary | ICD-10-CM

## 2016-11-14 DIAGNOSIS — Z7189 Other specified counseling: Secondary | ICD-10-CM

## 2016-11-14 DIAGNOSIS — C786 Secondary malignant neoplasm of retroperitoneum and peritoneum: Secondary | ICD-10-CM

## 2016-11-14 DIAGNOSIS — Z8673 Personal history of transient ischemic attack (TIA), and cerebral infarction without residual deficits: Secondary | ICD-10-CM

## 2016-11-14 DIAGNOSIS — K449 Diaphragmatic hernia without obstruction or gangrene: Secondary | ICD-10-CM

## 2016-11-14 DIAGNOSIS — I2510 Atherosclerotic heart disease of native coronary artery without angina pectoris: Secondary | ICD-10-CM | POA: Diagnosis not present

## 2016-11-14 DIAGNOSIS — Z794 Long term (current) use of insulin: Secondary | ICD-10-CM

## 2016-11-14 DIAGNOSIS — C801 Malignant (primary) neoplasm, unspecified: Secondary | ICD-10-CM

## 2016-11-14 DIAGNOSIS — Z86718 Personal history of other venous thrombosis and embolism: Secondary | ICD-10-CM

## 2016-11-14 DIAGNOSIS — C569 Malignant neoplasm of unspecified ovary: Secondary | ICD-10-CM

## 2016-11-14 DIAGNOSIS — E876 Hypokalemia: Secondary | ICD-10-CM

## 2016-11-14 DIAGNOSIS — Z66 Do not resuscitate: Secondary | ICD-10-CM

## 2016-11-14 DIAGNOSIS — Z87891 Personal history of nicotine dependence: Secondary | ICD-10-CM

## 2016-11-14 DIAGNOSIS — Z79899 Other long term (current) drug therapy: Secondary | ICD-10-CM

## 2016-11-14 DIAGNOSIS — I252 Old myocardial infarction: Secondary | ICD-10-CM

## 2016-11-14 DIAGNOSIS — I5032 Chronic diastolic (congestive) heart failure: Secondary | ICD-10-CM

## 2016-11-14 DIAGNOSIS — M069 Rheumatoid arthritis, unspecified: Secondary | ICD-10-CM

## 2016-11-14 DIAGNOSIS — Z8719 Personal history of other diseases of the digestive system: Secondary | ICD-10-CM

## 2016-11-14 DIAGNOSIS — K219 Gastro-esophageal reflux disease without esophagitis: Secondary | ICD-10-CM

## 2016-11-14 DIAGNOSIS — I110 Hypertensive heart disease with heart failure: Secondary | ICD-10-CM

## 2016-11-14 DIAGNOSIS — J90 Pleural effusion, not elsewhere classified: Secondary | ICD-10-CM | POA: Diagnosis not present

## 2016-11-14 DIAGNOSIS — T451X5A Adverse effect of antineoplastic and immunosuppressive drugs, initial encounter: Secondary | ICD-10-CM

## 2016-11-14 DIAGNOSIS — D6481 Anemia due to antineoplastic chemotherapy: Secondary | ICD-10-CM

## 2016-11-14 DIAGNOSIS — Z90710 Acquired absence of both cervix and uterus: Secondary | ICD-10-CM

## 2016-11-14 DIAGNOSIS — E785 Hyperlipidemia, unspecified: Secondary | ICD-10-CM

## 2016-11-14 DIAGNOSIS — Z9221 Personal history of antineoplastic chemotherapy: Secondary | ICD-10-CM

## 2016-11-14 DIAGNOSIS — Z7901 Long term (current) use of anticoagulants: Secondary | ICD-10-CM

## 2016-11-14 DIAGNOSIS — Z87442 Personal history of urinary calculi: Secondary | ICD-10-CM

## 2016-11-14 DIAGNOSIS — Z7951 Long term (current) use of inhaled steroids: Secondary | ICD-10-CM

## 2016-11-14 LAB — BASIC METABOLIC PANEL
Anion gap: 7 (ref 5–15)
BUN: 18 mg/dL (ref 6–20)
CO2: 30 mmol/L (ref 22–32)
Calcium: 9.4 mg/dL (ref 8.9–10.3)
Chloride: 97 mmol/L — ABNORMAL LOW (ref 101–111)
Creatinine, Ser: 0.83 mg/dL (ref 0.44–1.00)
GFR calc Af Amer: >60 mL/min (ref >60)
GFR calc non Af Amer: >60 mL/min (ref >60)
Glucose, Bld: 205 mg/dL — ABNORMAL HIGH (ref 65–99)
Potassium: 4 mmol/L (ref 3.5–5.1)
Sodium: 134 mmol/L — ABNORMAL LOW (ref 135–145)

## 2016-11-14 LAB — HEMOGLOBIN: Hemoglobin: 9.2 g/dL — ABNORMAL LOW (ref 12.0–16.0)

## 2016-11-14 LAB — MAGNESIUM: Magnesium: 1.3 mg/dL — ABNORMAL LOW (ref 1.7–2.4)

## 2016-11-14 MED ORDER — SODIUM CHLORIDE 0.9% FLUSH
10.0000 mL | Freq: Once | INTRAVENOUS | Status: DC
  Filled 2016-11-14: qty 10

## 2016-11-14 MED ORDER — HEPARIN SOD (PORK) LOCK FLUSH 100 UNIT/ML IV SOLN
500.0000 [IU] | Freq: Once | INTRAVENOUS | Status: AC
Start: 2016-11-14 — End: 2016-11-14
  Administered 2016-11-14: 500 [IU] via INTRAVENOUS
  Filled 2016-11-14: qty 5

## 2016-11-14 MED ORDER — SODIUM CHLORIDE 0.9 % IV SOLN
Freq: Once | INTRAVENOUS | Status: AC
  Administered 2016-11-14: 10:00:00 via INTRAVENOUS
  Filled 2016-11-14: qty 1000

## 2016-11-14 MED ORDER — MAGNESIUM SULFATE 4 GM/50ML IV SOLN
4.0000 g | Freq: Once | INTRAVENOUS | Status: AC
  Administered 2016-11-14: 4 g via INTRAVENOUS
  Filled 2016-11-14: qty 50

## 2016-11-14 MED ORDER — SODIUM CHLORIDE 0.9 % IV SOLN
Freq: Once | INTRAVENOUS | Status: DC
  Filled 2016-11-14: qty 50

## 2016-11-14 MED ORDER — MAGNESIUM SULFATE 4 GM/100ML IV SOLN: 4.0000 g | Freq: Once | INTRAVENOUS | Status: DC

## 2016-11-14 NOTE — Progress Notes (Signed)
[facility] day:  11/14/2016  Chief Complaint: [name] is a 71 y.o. female with stage IV ovarian cancer who is seen for 1 week assessment.  HPI:  The patient was last seen in the medical oncology clinic on 11/07/2016.  At that time, scans revealed progressive disease.  Topotecan was discontinued.  We discussed direction of therapy.  She wished to pursue treatment.  Options discussed included pembrolizumab if tumor was PDL-1 + or Abraxane.  Her neuropathy was stable.  Hematocrit was 25.7.  She received Procrit.  She received 4 gm magnesium.  PDL-1 testing is pending.  Symptomatically, she denies any complaints.   Past Medical History:  Diagnosis Date  . CAD (coronary artery disease)    a. 09/2012 Cath: LM nl, LAD 95p, 42m LCX 975mOM2 50, RCA 100.  . Marland Kitchenholelithiasis   . Chronic diastolic CHF (congestive heart failure) ([facility]  . Collagen vascular disease (HCC)    Rheumatoid Arthritis.  . Esophageal stricture   . Exertional shortness of breath   . GERD (gastroesophageal reflux disease)   . H/O hiatal hernia   . Herniated disc   . History of pancytopenia   . Hyperlipidemia   . Hypertension   . Hypokalemia   . Leukopenia 2012   s/p bone marrow biopsy, Dr. PaMa Hillock. NSTEMI (non-ST elevated myocardial infarction) ([facility]   "mild" (10/18/2012)  . Ovarian cancer ([facility]2016   chemo and hysterectomy  . Pneumonia 2013; 08/2012   "one lung; double" (10/18/2012)  . Rheumatoid arthritis(714.0)   . Type I diabetes mellitus ([facility]   "dx'd in 1957" (10/18/2012)    Past Surgical History:  Procedure Laterality Date  . ABDOMINAL HYSTERECTOMY  06/15/2015   Dx L/S, EXLAP TAH BSO omentectomy RSRx colostomy diaphragm resection stripping  . CARDIAC CATHETERIZATION  10/18/2012   "first one was today" (10/18/2012)  . CATARACT EXTRACTION W/ INTRAOCULAR LENS IMPLANT Right 2010  . CHOLECYSTECTOMY  06/15/2015   combined case with ovarian cancer  debulking  . COLON SURGERY    . CORONARY ARTERY BYPASS GRAFT N/A 10/19/2012   Procedure: CORONARY ARTERY BYPASS GRAFTING (CABG);  Surgeon: StMelrose NakayamaMD;  Location: [facility] Service: Open Heart Surgery;  Laterality: N/A;  . ESOPHAGEAL DILATION     "3 or 4 times" (10/19/2011)  . ESOPHAGOGASTRODUODENOSCOPY  2012   Dr. IfMinna Merritts. OSTOMY    . OVARY SURGERY     removal  . PERIPHERAL VASCULAR CATHETERIZATION N/A 03/02/2015   Procedure: PoGlori Luisath Insertion;  Surgeon: JaAlgernon HuxleyMD;  Location: [facility];  Service: Cardiovascular;  Laterality: N/A;  . TUBAL LIGATION  1970    Family History  Problem Relation Age of Onset  . Diabetes Mother   . Arthritis Mother   . Diabetes Father   . Arthritis Father   . Bone cancer Sister     Social History:  reports that she quit smoking about 22 years ago. Her smoking use included Cigarettes. She has a 30.00 pack-year smoking history. She has never used smokeless tobacco. She reports that she does not drink alcohol or use drugs.  She is accompanied by her husband today.  Allergies:  Allergies  Allergen Reactions  . Codeine Nausea And Vomiting  . Latex Rash    Current Medications: Current Outpatient Prescriptions  Medication Sig Dispense Refill  . Calcium-Vitamin D 600-200 MG-UNIT tablet Take 1 tablet by mouth 2 (two) times daily.     .Marland Kitchen  cholecalciferol (VITAMIN D) 1000 units tablet Take 1,000 Units by mouth daily.    . folic acid (FOLVITE) 1 MG tablet Take 1 tablet (1 mg total) by mouth daily. 90 tablet 3  . furosemide (LASIX) 80 MG tablet Take 0.5 tablets by mouth 2 (two) times daily.    . insulin lispro (HUMALOG) 100 UNIT/ML injection Inject 0.06 mLs (6 Units total) into the skin daily. (Patient taking differently: Inject 0-85 Units into the skin daily. Via insulin pump) 30 mL 3  . lidocaine-prilocaine (EMLA) cream APPLY 1 APPLICATION TOPICALLY AS NEEDED 1 HOUR PRIOR TO TREATMENT. COVER WITH PRESS N SEAL UNTIL TREATMENT TIME  AS DIRECTED 30 g 1  . loratadine (CLARITIN) 10 MG tablet Take 1 tablet by mouth daily as needed.    . magnesium oxide (MAG-OX) 400 (241.3 Mg) MG tablet TAKE 1 TABLET BY MOUTH ONCE DAILY 30 tablet 1  . metoprolol tartrate (LOPRESSOR) 25 MG tablet Take 1 tablet (25 mg total) by mouth 2 (two) times daily. 180 tablet 3  . ondansetron (ZOFRAN) 8 MG tablet TAKE 1 TABLET BY MOUTH EVERY 8 HOURS AS NEEDED FOR NAUSEA OR VOMITING 30 tablet 1  . pantoprazole (PROTONIX) 40 MG tablet TAKE 1 TABLET BY MOUTH TWICE (2) DAILY 180 tablet 1  . potassium chloride SA (K-DUR,KLOR-CON) 20 MEQ tablet TAKE 1 TABLET BY MOUTH DAILY 30 tablet 2  . predniSONE (DELTASONE) 5 MG tablet Take 5 mg by mouth daily with breakfast.    . vitamin C (ASCORBIC ACID) 500 MG tablet Take 500 mg by mouth daily.    Marland Kitchen zinc gluconate 50 MG tablet Take 50 mg by mouth daily.    Marland Kitchen enoxaparin (LOVENOX) 60 MG/0.6ML injection INJECT 0.6 ML (60 MG TOTAL) INTO THE SKIN EVERY 12 HOURS 60 mL 2   Current Facility-Administered Medications  Medication Dose Route Frequency Provider Last Rate Last Dose  . Tbo-Filgrastim (GRANIX) injection 300 mcg  300 mcg Subcutaneous Once Lequita Asal, MD       Facility-Administered Medications Ordered in Other Visits  Medication Dose Route Frequency Provider Last Rate Last Dose  . sodium chloride 0.9 % injection 10 mL  10 mL Intracatheter PRN Corcoran, Melissa C, MD      . sodium chloride 0.9 % injection 10 mL  10 mL Intravenous PRN Lequita Asal, MD   10 mL at 04/13/15 6203    Review of Systems:  GENERAL:  Feels "wonderful".  No fever, chills or sweats.  Weight down 1 pound. PERFORMANCE STATUS (ECOG):  1 HEENT:  No visual changes, sore throat, mouth sores or tenderness. Lungs:  No shortness of breath or cough.  No hemoptysis. Cardiac:  No chest pain, palpitations, orthopnea, or PND.  Took BP pill this AM. GI:  No abdominal pain.  No nausea, vomiting, diarrhea, melena or hematochezia. GU:  No urgency,  frequency, dysuria, or hematuria. Musculoskeletal:  Severe rheumatoid arthritis (off MTX and on prednisone).  Osteoporosis.  No back pain. No muscle tenderness. Extremities:  No pain or swelling. Skin:  No increased bruising or bleeding.  No rashes or skin changes. Neuro:  Neuropathy (stable).  No headache, numbness or weakness, balance or coordination issues. Endocrine:  Diabetes on an insulin pump.  Occasional hot flashes.  Psych:  No mood changes, depression or anxiety. Pain: No pain. Review of systems:  All other systems reviewed and found to be negative.  Physical Exam:  Blood pressure 112/61, pulse 76, temperature 98 F (36.7 C), temperature source Tympanic, resp. rate  18, weight 135 lb 8 oz (61.5 kg). GENERAL:  Well developed, well nourished woman sitting comfortably in the exam room in no acute distress. MENTAL STATUS:  Alert and oriented to person, place and time. HEAD:  Wearing a pink cap.  Alopecia.  Normocephalic, atraumatic, face symmetric, no Cushingoid features. EYES:  Gold rimmed glasses.  Blue eyes.  No conjunctivitis or scleral icterus.  ENT: Oropharynx clear without lesion. Tongue normal. Mucous membranes moist.  RESPIRATORY: Clear to auscultation without rales, wheezes or rhonchi. CARDIOVASCULAR: Regular rate and rhythm without murmur, rub or gallop. ABDOMEN: Soft, non-tender with active bowel sounds and no hepatosplenomegaly. No palpable nodularity or masses.  Insulin pump.  Ostomy bag. SKIN: Slight bruising on abdomen s/p Lovenox injections. RLE bruise.  No rashes or ulcers. EXTREMITIES:  No edema, skin discoloration or tenderness. No palpable cords. LYMPH NODES: No palpable cervical, supraclavicular, axillary or inguinal adenopathy  NEUROLOGICAL: Appropriate. PSYCH: Appropriate.   Radiology studies: 02/13/2015:  Abdomen and pelvic CT revealed bilateral mass-like adnexal regions (right adnexal mass 5.6 x 5.0 cm and the left adnexa mass 10.3 x 6.0 x 9.9  cm).  There was a large amount of soft tissue throughout the peritoneal cavity involving the omentum and other peritoneal surfaces.  There was a small volume ascites. There was a peripheral 3.3 x 1.9 cm low-attenuation lesion overlying the right lobe of the liver, likely representing a serosal implant. There was 1.4 x 2.2 cm ill-defined peripheral lesion within the inferior aspect of segment 6 adjacent to the ampulla in the duodenum.  04/28/2015:  Abdominal and pelvic CT revealed decreasing bilateral ovarian masses.   The left adnexal mass measured 4.0 x 6.1 cm (previously 5.0 x 8.5 cm). The right ovary measured 3.8 x 4.9 cm (previously 4.7 x 5.6 cm).  There was improved peritoneal carcinomatosis.  There was a small amount of ascites.  The hepatic dome lesion was stable. The previously seen right hepatic lobe lesion was not well-visualized.  There was a small left pleural effusion and trace right pleural effusion.  The nodular lesion at the ampulla of Vater, extending into the duodenum was stable.  There was no biliary ductal dilatation. 08/01/2015:  Rght upper extremity ultrasound revealed a near occlusive thrombus within the central portion of the right internal jugular vein and central portion of the right subclavian vein. 09/01/2015:  Chest, abdomen, and pelvic CT revealed continued decrease in perihepatic fluid collection contiguous with the right pleural space with percutaneous drain.  09/11/2015:  Chest, abdomen, and pelvic CT revealed a residual versus recurrent fluid collection posterior to the right hepatic lobe (2.6 x 1.1 x 4.0 cm).   10/13/2015:  Chest, abdomen, and pelvic CT revealed resolution of the empyema. 02/15/2016:  Chest, abdomen, and pelvic CT revealed multiple soft tissue nodules throughout the pelvis, small bowel mesenteric and peritoneum and, concerning for peritoneal metastatic disease.  There was soft tissue irregularity within the left upper quadrant.  There was mild right  hydronephrosis (etiology unclear).  There was interval resolution of previously described right pleural based fluid and gas collection. There was a pleural based nodule within the right lower hemi-thorax concerning for pleural based metastasis.  There were small bilateral pleural effusions.  There was pulmonary nodularity, predominately within the left upper lobe (metastatic or infectious/inflammatory etiology).  There was slightly increased mediastinal adenopathy (infectious/inflammatory or metastatic).  There was a low attenuation lesion within the left hepatic lobe (complicated fluid within the fissure or metastatic disease). 03/08/2016:  Abdomen and pelvic CT  revealed mild progression of peritoneal disease in the abdomen.  There was interval increase in loculated fluid around the lateral segment left liver, stomach, and spleen.  There was persistent soft tissue lesion at the level of the ampulla with mild intra and extrahepatic biliary duct dilatation.  There was persistent intrahepatic and capsular metastatic disease involving the liver. 05/26/2016:  Head MRI revealed multiple small areas of acute infarct involving the occipital parietal lobe bilaterally and the left lateral cerebellum,  consistent with posterior circulation emboli.  06/10/2016:  Adomen and pelvic CT revealed progression of peritoneal carcinomatosis predominantly in the perihepatic space, bilateral lower quadrants and pelvis.  There was worsening bilateral obstructive uropathy due to malignant involvement of the pelvic ureters.  There was stable small pleural/subpleural nodules at the right lung base.  There was stable small left pleural effusion.  There was decreased small volume perihepatic ascites.  08/09/2016:  Abdomen and pelvic CT revealed interval increase and loculated fluid collections within the peritoneal space consistent with progression of peritoneal ovarian carcinoma metastasis.  There was one large 7 cm collection in the  RIGHT lower quadrant which had increased significantly in size and may represent of point of small bowel obstruction.  The proximal stomach and duodenum were decompressed. The mid small bowel was mildly dilated. There was concern for obstruction given the large intraperitoneal fluid collection.  There was increase in subcapsular hepatic metastatic fluid collections.  There was enlargement of rounded ampullary lesion in the second portion duodenum concerning for metastatic lesion.  There was mild biliary duct dilatation (stable).  There was nodular pleural metastasis in the RIGHT lower lobe pleural space. 11/03/2016:  Abdomen and pelvic CT revealed multifocal cystic metastases in the abdomen/pelvis, reflecting peritoneal disease.  The dominant lesion in the left anterior abdomen has progressed, while additional lesions were mixed.  Peritoneal disease along the liver and spleen progressed.  Pleural-based metastases along the posterior right hemithorax progressed. There were trace left pleural effusion.  There was a stable 1.7 cm duodenal lesion at the ampulla.   Appointment on 11/14/2016  Component Date Value Ref Range Status  . Sodium 11/14/2016 134* 135 - 145 mmol/L Final  . Potassium 11/14/2016 4.0  3.5 - 5.1 mmol/L Final  . Chloride 11/14/2016 97* 101 - 111 mmol/L Final  . CO2 11/14/2016 30  22 - 32 mmol/L Final  . Glucose, Bld 11/14/2016 205* 65 - 99 mg/dL Final  . BUN 11/14/2016 18  6 - 20 mg/dL Final  . Creatinine, Ser 11/14/2016 0.83  0.44 - 1.00 mg/dL Final  . Calcium 11/14/2016 9.4  8.9 - 10.3 mg/dL Final  . GFR calc non Af Amer 11/14/2016 >60  >60 mL/min Final  . GFR calc Af Amer 11/14/2016 >60  >60 mL/min Final   Comment: (NOTE) The eGFR has been calculated using the CKD EPI equation. This calculation has not been validated in all clinical situations. eGFR's persistently <60 mL/min signify possible Chronic Kidney Disease.   . Anion gap 11/14/2016 7  5 - 15 Final  . Magnesium  11/14/2016 1.3* 1.7 - 2.4 mg/dL Final  . Hemoglobin 11/14/2016 9.2* 12.0 - 16.0 g/dL Final    Assessment:  [name] is a 71 y.o. female with progressive stage IV ovarian cancer.  She presented with abdominal discomfort and bloating.  Omental biopsy on 02/23/2015 revealed metastatic high grade serous carcinoma, consistent with gynecologic origin.   She was initially diagnosed with clinical stage IIIC (T3cN1Mx).  CA125 was 707 on 02/17/2015.  She  received 4 cycles of neoadjuvant carboplatin and Taxol (03/05/2015 - 05/22/2015).  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  She had loose stools on oral magnesium.  She was initially on Neurontin then switched Lyrica with cycle #3.  Cycle #4 was notable for neutropenia (ANC 300) requiring GCSF x 3 days.    She received tamoxifen from 02/25/2016 - 03/14/2016.  She received 4 cycles of carboplatin and gemcitabine (03/21/2016 - 05/24/2016) with GCSF/Neulasta support.  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  She received 2 cycles of Doxil (06/21/2016 - 07/19/2016) with Neulasta support.  She received 4 cycles of topotecan with Neulasta support (08/22/2016 - 10/10/2016).  CA125 was 802.9 on 03/30/2015, 567.9 on 04/13/2015, 168.8 on 05/15/2015, 85.2 on 07/17/2015, 68.6 on 07/28/2015, 34.5 on 09/17/2015, 20.7 on 11/06/2015, 49 on 01/22/2016, 106.1 on 02/22/2016, 138.8 on 03/07/2016, 220.8 on 03/21/2016, 152.8 on 04/11/2016, 125.6 on 04/18/2016, 76.8 on 05/03/2016, 60.2 on 05/24/2016, 44 on 07/19/2016, 65.8 on 08/17/2016, 53.4 on 09/12/2016, and 47.3 on 10/10/2016.  She underwent exploratory laparotomy, lysis of adhesions, total abdominal hysterectomy with bilateral salpingo-oophorectomy, infracolic omentectomy, optimal tumor debulking(< 1 cm), recto-sigmoid resection with creation of end colostomy, cholecystectomy, mobilization of splenic flexure and liver with diaphragmatic stripping on 06/15/2015. The right diaphragm was cleared  of tumor. During dissection, the diaphragm was entered and closed with sutures.   She had a recurrent right sided pleural effusion.  She underwent thoracentesis of 650 cc on post-operative day 3.  She was admitted to [facility] on 07/01/2015 and 07/07/2015 for recurrent shortness of breath.  She underwent 2 additional thoracenteses (1.1 L on 07/02/2015 and 850 cc on 07/08/2015).  Cytology was negative x 2.  Bilateral lower extremity duplex on 07/03/2015 was negative.  Echo revealed an EF of 55-60% on 07/08/2015 and 60-65% on 05/27/2016.    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 She was on Lovenox 60 mg twice a day.  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  She was admitted to [facility] from 07/29/2015 - 08/10/2015 with a right-sided empyema and liver abscess. She underwent CT-guided placement of a liver abscess drain on 07/30/2015.  Liver abscess culture grew out group B strep and Enterobacter which was sensitive to Zosyn. She was transitioned to ertapenem Colbert Ewing) prior to discharge.  She was readmitted to [facility] on 09/17/2015.  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  Head MRI on 05/26/2016 revealed multiple small areas of acute infarct involving the occipital parietal lobe bilaterally and the left lateral cerebellum,  consistent with posterior circulation emboli.  Work-up included a negative carotid ultrasound and echocardiogram.  She was admitted to [facility] from 08/10/2016 - 08/14/2016 with a small bowel obstruction.  She was managed conservatively.  She has severe rheumatoid arthritis.  Methotrexate and Enbrel were initially on hold.  She has a normocytic anemia.  Work-up on 02/17/2015 and 05/24/2016 revealed a normal ferritin, B12, folate, TSH.  She  denies any melena or hematochezia.    She has anemia due to chronic disease. She received 1 unit PRBCs during her admission at [facility]. She denies any melena or hematochezia. She has diabetes and is on an insulin pump.  She has had persistent neutropenia felt secondary to her rheumatoid arthritis.  Folate and MMA  were normal.  TSH was 6.13 (high) with a free T4 of 1.17 (0.61-1.12).  She began methotrexate (10 mg a week) and prednisone (5 mg a day) for severe rheumatoid arthritis on 12/17/2015.  She remains on prednisone alone (5 mg a day).  Bone marrow aspirate and biopsy on 06/09/2010 revealed a hypercellular marrow (70%) with no evidence of dysplasia or malignancy.  Flow cytometry was negative.  Cytogenetics were normal (46,XX).  FISH studies were negative for MDS.  Bone marrow aspirate and biopsy on 12/03/2015 revealed a normocellular to mildy hypercellular marrow for age (40%) with left shifted myelopoiesis, non specific dyserythropoiesis and mild megakaryocytic atypia with no increase in blasts.  There were multiple small nonspecific lymphoid aggregates (favor reactive). There was no increase in reticulin.  There was decreased myeloid cells (37%) with left shifted maturation and 1% atypical myelod blasts.  There was relatively increased monocytic cells (11%), relatively increased lymphoid cells (36%), and relatively increased eosinophils (6%).  Cytogenetics were normal (62, XX).  SNP microarray was normal.  Abdomen and pelvic CT scan on 11/03/2016 revealed multifocal cystic metastases in the abdomen/pelvis, reflecting peritoneal disease.  The dominant lesion in the left anterior abdomen has progressed, while additional lesions were mixed.  Peritoneal disease along the liver and spleen progressed.  Pleural-based metastases along the posterior right hemithorax progressed. There were trace left pleural effusion.  There was a stable 1.7 cm duodenal lesion at the ampulla.  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  She receives IV magnesium weekly.  She has chemotherapy induced anemia.  She has received Procrit (last 11/07/2016).  Labs on 08/22/2016 revealed a normal B12 and folate.  Ferritin was 37, iron saturation 11% and TIBC 281.  Code status is DNR/DNI.  Symptomatically, she denies any complaint.  Exam is stable.  Scans reveal progressive disease.  She has chemotherapy induced anemia.  She has chronic hypomagnesemia (1.3).    Plan: 1.  Labs today:  BMP, Mg. 2.  Await PDL-1 testing.  Review plan for Keytruda if PDL-1 positive.  If PDL-1 negative, patient would like to proceed with Abraxane.  Discuss potential for increased neuropathy on Abraxane.  Patient aware and wishes to pursue treatment. 3.  Call patient once PDL-1 testing back.  Schedule treatment after results back. 4.  Magnesium sulfate 4 gm IV today 5.  RTC in 1 week for labs (Hgb, Mg) +/- Procrit and IV Mg 6.  RTC in 2 weeks for MD assessment, labs (Mg +/- others) and +/- IV Mg   Lequita Asal, MD  11/14/2016

## 2016-11-14 NOTE — Progress Notes (Signed)
Patient offers no concerns today. 

## 2016-11-15 ENCOUNTER — Other Ambulatory Visit: Payer: Self-pay | Admitting: Hematology and Oncology

## 2016-11-15 DIAGNOSIS — Z5111 Encounter for antineoplastic chemotherapy: Secondary | ICD-10-CM

## 2016-11-15 DIAGNOSIS — C569 Malignant neoplasm of unspecified ovary: Secondary | ICD-10-CM

## 2016-11-16 ENCOUNTER — Encounter: Payer: Self-pay | Admitting: Hematology and Oncology

## 2016-11-16 ENCOUNTER — Inpatient Hospital Stay (HOSPITAL_BASED_OUTPATIENT_CLINIC_OR_DEPARTMENT_OTHER): Payer: Medicare Other | Admitting: Obstetrics and Gynecology

## 2016-11-16 ENCOUNTER — Encounter: Payer: Self-pay | Admitting: Obstetrics and Gynecology

## 2016-11-16 VITALS — BP 148/71 | HR 83 | Temp 97.3°F | Resp 18 | Ht 63.0 in | Wt 137.8 lb

## 2016-11-16 DIAGNOSIS — Z90722 Acquired absence of ovaries, bilateral: Secondary | ICD-10-CM | POA: Diagnosis not present

## 2016-11-16 DIAGNOSIS — M069 Rheumatoid arthritis, unspecified: Secondary | ICD-10-CM | POA: Diagnosis not present

## 2016-11-16 DIAGNOSIS — C786 Secondary malignant neoplasm of retroperitoneum and peritoneum: Secondary | ICD-10-CM

## 2016-11-16 DIAGNOSIS — K219 Gastro-esophageal reflux disease without esophagitis: Secondary | ICD-10-CM | POA: Diagnosis not present

## 2016-11-16 DIAGNOSIS — Z66 Do not resuscitate: Secondary | ICD-10-CM

## 2016-11-16 DIAGNOSIS — Z7952 Long term (current) use of systemic steroids: Secondary | ICD-10-CM

## 2016-11-16 DIAGNOSIS — I110 Hypertensive heart disease with heart failure: Secondary | ICD-10-CM | POA: Diagnosis not present

## 2016-11-16 DIAGNOSIS — Z9221 Personal history of antineoplastic chemotherapy: Secondary | ICD-10-CM | POA: Diagnosis not present

## 2016-11-16 DIAGNOSIS — Z90710 Acquired absence of both cervix and uterus: Secondary | ICD-10-CM | POA: Diagnosis not present

## 2016-11-16 DIAGNOSIS — C569 Malignant neoplasm of unspecified ovary: Secondary | ICD-10-CM | POA: Diagnosis not present

## 2016-11-16 DIAGNOSIS — Z7901 Long term (current) use of anticoagulants: Secondary | ICD-10-CM

## 2016-11-16 DIAGNOSIS — C7800 Secondary malignant neoplasm of unspecified lung: Secondary | ICD-10-CM | POA: Diagnosis not present

## 2016-11-16 DIAGNOSIS — I2510 Atherosclerotic heart disease of native coronary artery without angina pectoris: Secondary | ICD-10-CM

## 2016-11-16 DIAGNOSIS — I5032 Chronic diastolic (congestive) heart failure: Secondary | ICD-10-CM | POA: Diagnosis not present

## 2016-11-16 DIAGNOSIS — I252 Old myocardial infarction: Secondary | ICD-10-CM

## 2016-11-16 DIAGNOSIS — Z86718 Personal history of other venous thrombosis and embolism: Secondary | ICD-10-CM

## 2016-11-16 DIAGNOSIS — E119 Type 2 diabetes mellitus without complications: Secondary | ICD-10-CM

## 2016-11-16 DIAGNOSIS — Z8673 Personal history of transient ischemic attack (TIA), and cerebral infarction without residual deficits: Secondary | ICD-10-CM

## 2016-11-16 NOTE — Progress Notes (Signed)
  Oncology Nurse Navigator Documentation Chaperoned pelvic exam. Navigator Location: [facility] (11/16/16 1400)   )Navigator Encounter Type: Follow-up Appt (11/16/16 1400)                     Patient Visit Type: GynOnc (11/16/16 1400)                              Time Spent with Patient: 15 (11/16/16 1400)

## 2016-11-16 NOTE — Progress Notes (Signed)
Gynecologic Oncology Interval Visit   Referring Provider: Lequita Asal, MD  Additional Care Provider: Dr. Harvest Dark.  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    Subjective:  [name] is a 71 y.o. 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  She was initiated on single agent topotecan (starting 08/22/2016 - 10/10/2016) and received 3 cycles of therapy. She required Neulasta support.    Abdomen and pelvic CT scan on 11/03/2016 revealed multifocal cystic metastases in the abdomen/pelvis, reflecting peritoneal disease.  The dominant lesion in the left anterior abdomen has progressed, while additional lesions were mixed.  Peritoneal disease along the liver and spleen progressed.  Pleural-based metastases along the posterior right hemithorax progressed. There were trace left pleural effusion.  There was a stable 1.7 cm duodenal lesion at the ampulla.  Dr. Mike Gip discussed alternative options with her including abraxane and pembrolizumab. They are awaiting PDL-1 testing and if positive proceed with IO.  If PDL-1 negative, proceed with Abraxane.  .   Lab Results  Component Value Date   CA125 47.3 (H) 10/10/2016   CA125 53.4 (H) 09/12/2016   CA125 65.8 (H) 08/17/2016   She presents for a pelvic exam today.   HPI: The patient presented to the emergency room at [facility] on 02/13/2015 with a 2-3 week history of diffuse abdominal discomfort and bloating.    Abdomen and pelvic CT scan on 02/13/2015 revealed bilateral mass-like adnexal regions. The right adnexal region measured approximately 5.6 x 5.0 cm posterior to the uterus, and the left adnexa demonstrated a large mass  measuring approximately 10.3 x 6.0 x 9.9 cm, with heterogeneous internal areas of enhancement and cystic change. This appeared intimately associated with the adjacent proximal sigmoid colon. There was a large amount of soft tissue throughout the peritoneal cavity involving the omentum and other peritoneal surfaces, compatible with widespread intraperitoneal metastatic disease. There was a small volume of presumably malignant ascites. There was a peripheral low-attenuation lesion overlying the right lobe of the liver measuring approximately 3.3 x 1.9 cm, favored to represent a serosal implant. There was a similar appearing 1.4 x 2.2 cm ill-defined peripheral lesion within the inferior aspect of segment 6 adjacent to the ampulla in the duodenum. There was no lytic or blastic lesions. She had a chronic T12 compression fracture.  She was seen in consultation from Dr. Mike Gip for bilateral ovarian masses with associated peritoneal metastatic disease.   02/23/2015 CT biopsy high grade serous carcinoma.  Decision was made for neoadjuvant chemotherapy with carboplatin/taxol. She has had 4 cycles of carboplatin (AUC 5) /taxol (175 mg per sq M) with Dr Mike Gip, last 12/19.  CA125 dropped from 707 in 10/16 to 168.8 in 05/15/2015.   She stopped MTX and Enbarel she had been taking for rheumatoid arthritis while on chemotherapy and joint symptoms have been in good control.  Uses Prednisone 53m qd prn and has taken this the last three days.   CT scan 04/27/16 Heterogeneous left adnexal mass measures approximately 4.0 x 6.1 cm, previously approximately 5.0 x 8.5 cm when remeasured. Heterogeneous right ovary measures approximately 3.8 x 4.9 cm, roughly 4.7 x 5.6 cm on the prior exam. Uterus is visualized. Other: Omental caking has improved in the interval but has not fully resolved. Residual scattered  areas of omental nodularity and thickening persist. Small ascites, decreased.   Hepatobiliary: Low-attenuation lesion  in the periphery of the hepatic dome measures 3.1 cm, stable. A lesion previously seen off the posterior right hepatic lobe is not readily visualized. There is a small amount of fluid in Morison's pouch. Numerous stones are seen in the gallbladder. No biliary ductal dilatation.  IMPRESSION: 1. Interval response to therapy as evidenced by decrease in size of bilateral ovarian masses and improved, but not resolved, peritoneal carcinomatosis. 2. Hepatic dome lesion is unchanged. Previously seen right hepatic lobe lesion is not well-visualized. 3. Small ascites and small left pleural effusion. Trace right pleural effusion. 4. Cholelithiasis. 5. Nodular lesion at the ampulla of Vater, extending into the duodenum, stable.  On 06/15/2015 she underwent diagnostic laparoscopy, exploratory laparotomy, lysis of adhesions (including enterolysis for > 45 minutes), total abdominal hysterectomy with bilateral salpingo-oophorectomy, infracolic omentectomy, optimal tumor debulking, recto-sigmoid resection with creation of end colostomy, mobilization of the splenic flexure  mobilization of the liver with diaphragmatic stripping and resection of diaphragm due to disease, repair of diaphragmatic defect and cholecystectomy. Her initial postoperative course was unremarkable.   PATHOLOGY: DIAGNOSIS  A. Uterus, bilateral ovaries and fallopian tubes, hysterectomy and bilateral salpingo-oophorectomy:  Left ovary: Residual high grade serous carcinoma, 2.5 cm. Right ovary: Residual high grade serous carcinoma, 1.5 cm.  Right and left fallopian tubes, free of tumor. See comment.  Uterus:  Residual high grade serous carcinoma involving uterine serosa and myometrium. Endometrium: Atrophic endometrium Cervix: No pathologic diagnosis.   See synoptic report for additional details.  Comment: Immunostains performed on block A17 ( right tube) show a small segment of p53 positive tubal epithelium. However, this focus of  epithelium is cytological unremarkable and shows no increase in Ki-67 labelling or P16 staining. It is therefore classified as a p53 signature lesion rather than a serous tubal intraepithelial carcinoma.    B. Rectomsigmoid colon and partial left ovary, resection:   Residual high grade serous carcinoma involving colonic serosa and subserosa, fallopian tube and ovary.   C. Omentum, omentectomy:   Residual high grade serous carcinoma, tumor size > 2 cm.   D. Perihepatic tumor, excision:  Residual high grade serous carcinoma.   E. Gallbladder, cholecystectomy:  Chronic cholecystitis and cholelithiasis. Negative for malignancy.   F. Diaphragm, excision:  Residual high grade serous carcinoma, > 2 cm  G. Omentum #2, excision:   Residual high grade serous carcinoma.     She developed dyspnea and was admitted twice to [facility]. She was [facility] to [facility] on 07/[facility]/2015 and 07/07/2015 for recurrent shortness of breath.  She has undergone 2 additional thoracentesis (1.1 L on 07/02/2015 and 850 cc on 07/08/2015).  Cytology was negative x 2.  Bilateral lower extremity duplex on 07/03/2015 was negative.  Echo on 07/08/2015 revealed en EF of 55-60%.    She was admitted to [facility] from 07/29/2015 - 08/10/2015 with a right-sided empyema and perihepatic abscess. She underwent CT-guided placement of a liver abscess drain on 07/30/2015. Liver abscess culture grew out group B strep and Enterobacter which was sensitive to Zosyn. She was transitioned to ertapenem Colbert Ewing) prior to discharge.  She was readmitted to [facility] on 09/17/2015.  Chest, abdomen, and pelvic CT scan on 09/01/2015 revealed continued decrease in perihepatic fluid collection contiguous with the right pleural space with percutaneous drain. Drain was removed on 09/01/2015.  Chest, abdomen, and pelvic CT scan on 09/11/2015 revealed a residual versus recurrent fluid collection posterior to the right hepatic lobe (  2.6 x 1.1 x  4.0 cm).  Chest, abdomen, and pelvic CT scan on 10/13/2015 revealed resolution of the empyema.    Chemotherapy was held due to a combination of multiple comorbidities and RA associated neutropenia.  We recommended proceeding with chemotherapy if she had a symptomatic relapse or if her CT scan shows considerable disease.  02/15/2016:CT scan revealed multiple soft tissue nodules throughout the pelvis, small bowel mesenteric and peritoneum and, concerning for peritoneal metastatic disease.  There was soft tissue irregularity within the left upper quadrant which may represent postprocedural changes or metastatic disease; mild right hydronephrosis (etiology unclear); a pleural based nodule within the right lower hemi-thorax concerning for the possibility of pleural based metastasis; small bilateral pleural effusions; pulmonary nodularity, predominately within the left upper lobe (metastatic or infectious/inflammatory etiology); slightly increased mediastinal adenopathy (infectious/inflammatory or metastatic); interval development of a low attenuation lesion within the left hepatic lobe (complicated fluid within the fissure or metastatic disease).  She was restarted on chemotherapy with carboplatin and gemcitabine.  She just saw Dr. Mike Gip prior to day 8 of cycle #3 carboplatin and gemcitabine.  05/26/2016 Imaging included MR brain that revealed multiple small areas of acute infarct involving the occipital parietal lobe bilaterally and the left lateral cerebellum, consistent with posterior circulation emboli.  Atrophy and mild chronic microvascular ischemia. US carotid: Less than 50% stenosis in the right internal carotid artery.  50-69% stenosis in the left internal carotid artery. CT angiography: Right proximal ICA mild 40% stenosis due to calcified plaque, Left proximal ICA moderate 60% stenosis due to calcified plaque, Bilateral vertebral artery V3/V4 junction calcified plaque with moderate stenosis,  Bilateral ICA cavernous and paraclinoid severe calcific atherosclerosis with mild-to-moderate paraclinoid stenosis, Otherwise no significant stenosis, proximal occlusion, aneurysm, or vascular malformation of circle of Willis; Mild aortic atherosclerosis; 15 mm left thyroid lobe nodule, this can be further characterized with thyroid ultrasound on a nonemergent basis; Left upper lobe pulmonary nodules measuring up to 4 mm.   06/10/2016 CT abdomen and pelvis: Progression of peritoneal carcinomatosis; worsening bilateral obstructive uropathy due to malignant involvement of the pelvic ureters; stable small pleural/subpleural nodules right and small left pleural effusion; additional findings include aortic atherosclerosis, coronary atherosclerosis, stable nonspecific ampullary mass and small hiatal hernia.  Her chemotherapy was altered to Doxil and she was responding based on CA125 values. 60.2 to 44.   08/09/2016 She was seen in [facility] ER with concerns for SBO and transferred to [facility] on 08/10/2016. She had a transition point in the RLQ associated with a fluid filled cystic lesion.   08/09/2016 CT scan A/P: Interval increase and loculated fluid collections within the peritoneal space consistent with progression of peritoneal ovarian carcinoma metastasis. One large 7 cm collection in the RIGHT lower quadrant is increased significantly in size and may represent of point of small bowel obstruction; Increase in subcapsular hepatic metastatic fluid collections; Enlargement of rounded ampullary lesion in the second portion duodenum is concerning for metastatic lesion; nodular pleural metastasis in the RIGHT lower lobe pleural space. Lesions increased.  She was managed conservatively and discharged home on 08/14/2016. During that hospitalization we had a goals of care conversation. She is DNAR and has advanced care directives in place. Her husband is her HCPOA. She is interested in more therapy     Genetic testing:  MyRisk testing negative for APC, ATM, BMPR1A, BRCA1, BRCA2, BRIP1, CDH1, CDK4, CDKN2A, CHEK2, EPCAM, MLH1, MSH2, MSH6, MUTYH, NBN, PALB2, PMS2, PTEN, RAD51C, RAD51D, SMAD4, STK11, TP53. Sequencing for select regions  of POLE and POLD1, and large rearrangement analysis for select regions of GREM1 were negative.   Of note she has severe rheumatoid arthritis with persistent neutropenia; diabetes and is on an insulin pump; near occlusive thrombus within the central portion of the right internal jugular vein and central portion of the right subclavian vein on Lovenox; and persistent grade III neuropathy.     Problem List: Patient Active Problem List   Diagnosis Date Noted  . Anemia due to antineoplastic chemotherapy 11/13/2016  . Pancytopenia ([place]) 09/27/2016  . Thrombocytopenia ([place]) 09/04/2016  . Goals of care, counseling/discussion 08/19/2016  . Pressure injury of skin 05/27/2016  . Altered mental status 05/26/2016  . Acute embolic stroke ([place]) 40/01/2724  . Hx of CABG 04/12/2016  . Encounter for antineoplastic chemotherapy 03/31/2016  . Heel ulcer ([place]) 12/09/2015  . Bile duct abnormality 12/09/2015  . Diabetic ulcer of left heel associated with type 1 diabetes mellitus ([place]) 12/07/2015  . Alkaline phosphatase elevation 11/17/2015  . Leukopenia 11/06/2015  . Weight loss 09/17/2015  . Liver abscess 09/03/2015  . Thrombosis of right internal jugular vein ([place]) 09/03/2015  . Fatigue 07/17/2015  . Recurrent pleural effusion on right 07/01/2015  . Decubitus ulcer 05/19/2015  . Hyponatremia 05/15/2015  . Neuropathy 05/15/2015  . Malignant neoplasm of ovary ([place]) 05/06/2015  . Bilateral lower extremity edema 03/30/2015  . Hypomagnesemia 03/05/2015  . Diarrhea 02/26/2015  . Peritoneal carcinomatosis ([place]) 02/17/2015  . Anemia 02/17/2015  . Adnexal mass 02/13/2015  . History of esophageal stricture 07/08/2014  . Dysphagia, pharyngoesophageal phase 07/08/2014  . Candida esophagitis ([place])  05/15/2014  . Cataracts, bilateral 01/13/2014  . Medicare annual wellness visit, subsequent 09/20/2013  . CAD (coronary artery disease) of artery bypass graft 11/05/2012  . Anemia, iron deficiency 10/09/2012  . CAD (coronary artery disease) 10/01/2012  . Hearing loss 07/06/2011  . Diabetes type 1, controlled ([place]) 07/06/2011  . Gastroesophageal reflux disease with stricture 07/06/2011  . GERD (gastroesophageal reflux disease) 04/05/2011  . Osteoporosis, unspecified 04/05/2011  . Hypertension 01/10/2011  . Hyperlipidemia 01/10/2011  . RA (rheumatoid arthritis) ([place]) 01/10/2011   Past Medical History: Past Medical History:  Diagnosis Date  . CAD (coronary artery disease)    a. 09/2012 Cath: LM nl, LAD 95p, 83m LCX 950mOM2 50, RCA 100.  . Marland Kitchenholelithiasis   . Chronic diastolic CHF (congestive heart failure) ([place]  . Collagen vascular disease (HCC)    Rheumatoid Arthritis.  . Esophageal stricture   . Exertional shortness of breath   . GERD (gastroesophageal reflux disease)   . H/O hiatal hernia   . Herniated disc   . History of pancytopenia   . Hyperlipidemia   . Hypertension   . Hypokalemia   . Leukopenia 2012   s/p bone marrow biopsy, Dr. PaMa Hillock. NSTEMI (non-ST elevated myocardial infarction) ([place]5/2014   "mild" (10/18/2012)  . Ovarian cancer ([facility]2016   chemo and hysterectomy  . Pneumonia 2013; 08/2012   "one lung; double" (10/18/2012)  . Rheumatoid arthritis(714.0)   . Type I diabetes mellitus ([facility]   "dx'd in 1957" (10/18/2012)    Past Surgical History: Past Surgical History:  Procedure Laterality Date  . ABDOMINAL HYSTERECTOMY  06/15/2015   Dx L/S, EXLAP TAH BSO omentectomy RSRx colostomy diaphragm resection stripping  . CARDIAC CATHETERIZATION  10/18/2012   "first one was today" (10/18/2012)  . CATARACT EXTRACTION W/ INTRAOCULAR LENS IMPLANT Right 2010  . CHOLECYSTECTOMY  06/15/2015   combined case with ovarian cancer  debulking  . COLON SURGERY    . CORONARY  ARTERY BYPASS GRAFT N/A 10/19/2012   Procedure: CORONARY ARTERY BYPASS GRAFTING (CABG);  Surgeon: Melrose Nakayama, MD;  Location: [facility];  Service: Open Heart Surgery;  Laterality: N/A;  . ESOPHAGEAL DILATION     "3 or 4 times" (10/19/2011)  . ESOPHAGOGASTRODUODENOSCOPY  2012   Dr. Minna Merritts  . OSTOMY    . OVARY SURGERY     removal  . PERIPHERAL VASCULAR CATHETERIZATION N/A 03/02/2015   Procedure: Glori Luis Cath Insertion;  Surgeon: Algernon Huxley, MD;  Location: [facility];  Service: Cardiovascular;  Laterality: N/A;  . TUBAL LIGATION  1970    Past Gynecologic History:  Postmenopausal She has not had a GYN evaluation since age 47.    OB History:  G3P3  1. SVD - neonatal death 2. SVD - singleton 3. SVD - twins  Family History: Family History  Problem Relation Age of Onset  . Diabetes Mother   . Arthritis Mother   . Diabetes Father   . Arthritis Father   . Bone cancer Sister     Social History: Social History   Social History  . Marital status: Married    Spouse name: N/A  . Number of children: 3  . Years of education: N/A   Occupational History  . Retired Retired   Social History Main Topics  . Smoking status: Former Smoker    Packs/day: 1.00    Years: 30.00    Types: Cigarettes    Quit date: 06/11/1994  . Smokeless tobacco: Never Used  . Alcohol use No  . Drug use: No  . Sexual activity: No   Other Topics Concern  . Not on file   Social History Narrative  . No narrative on file    Allergies: Allergies  Allergen Reactions  . Codeine Nausea And Vomiting  . Latex Rash    Current Medications: Current Outpatient Prescriptions  Medication Sig Dispense Refill  . Calcium-Vitamin D 600-200 MG-UNIT tablet Take 1 tablet by mouth 2 (two) times daily.     . cholecalciferol (VITAMIN D) 1000 units tablet Take 1,000 Units by mouth daily.    Marland Kitchen enoxaparin (LOVENOX) 60 MG/0.6ML injection INJECT 0.6 ML (60 MG TOTAL) INTO THE SKIN EVERY 12 HOURS 60 mL 2  .  folic acid (FOLVITE) 1 MG tablet Take 1 tablet (1 mg total) by mouth daily. 90 tablet 3  . furosemide (LASIX) 80 MG tablet Take 0.5 tablets by mouth 2 (two) times daily.    . insulin lispro (HUMALOG) 100 UNIT/ML injection Inject 0.06 mLs (6 Units total) into the skin daily. (Patient taking differently: Inject 0-85 Units into the skin daily. Via insulin pump) 30 mL 3  . lidocaine-prilocaine (EMLA) cream APPLY 1 APPLICATION TOPICALLY AS NEEDED 1 HOUR PRIOR TO TREATMENT. COVER WITH PRESS N SEAL UNTIL TREATMENT TIME AS DIRECTED 30 g 1  . loratadine (CLARITIN) 10 MG tablet Take 1 tablet by mouth daily as needed.    . magnesium oxide (MAG-OX) 400 (241.3 Mg) MG tablet TAKE 1 TABLET BY MOUTH ONCE DAILY 30 tablet 1  . metoprolol tartrate (LOPRESSOR) 25 MG tablet Take 1 tablet (25 mg total) by mouth 2 (two) times daily. 180 tablet 3  . ondansetron (ZOFRAN) 8 MG tablet TAKE 1 TABLET BY MOUTH EVERY 8 HOURS AS NEEDED FOR NAUSEA OR VOMITING 30 tablet 1  . pantoprazole (PROTONIX) 40 MG tablet TAKE 1 TABLET BY MOUTH TWICE (2) DAILY 180 tablet 1  .  potassium chloride SA (K-DUR,KLOR-CON) 20 MEQ tablet TAKE 1 TABLET BY MOUTH DAILY 30 tablet 2  . predniSONE (DELTASONE) 5 MG tablet Take 5 mg by mouth daily with breakfast.    . vitamin C (ASCORBIC ACID) 500 MG tablet Take 500 mg by mouth daily.    Marland Kitchen zinc gluconate 50 MG tablet Take 50 mg by mouth daily.     Current Facility-Administered Medications  Medication Dose Route Frequency Provider Last Rate Last Dose  . Tbo-Filgrastim (GRANIX) injection 300 mcg  300 mcg Subcutaneous Once Lequita Asal, MD       Facility-Administered Medications Ordered in Other Visits  Medication Dose Route Frequency Provider Last Rate Last Dose  . sodium chloride 0.9 % injection 10 mL  10 mL Intracatheter PRN Corcoran, Melissa C, MD      . sodium chloride 0.9 % injection 10 mL  10 mL Intravenous PRN Nolon Stalls C, MD   10 mL at 04/13/15 1025    Review of Systems General:  fatigue and weakness Skin: negative HEENT: negative Pulmonary: dyspnea chronic Cardiac: negative for pain Gastrointestinal:  Abdominal bloating; no N/V/; diarrhea due to magnesium; ostomy is functioning well.  Genitourinary/Sexual: negative for, dysuria Ob/Gyn: negative for, irregular bleeding, pain Musculoskeletal: positive for back pain  Hematology: negative for, easy bruising, bleeding Neuro: peripheral neuropathy symptoms, chronic secondary from chemotherapy and DM  Objective:  Physical Examination:  BP (!) 148/71   Pulse 83   Temp (!) 97.3 F (36.3 C) (Tympanic)   Resp 18   Ht 5' 3"  (1.6 m)   Wt 137 lb 12.8 oz (62.5 kg)   BMI 24.41 kg/m   Body mass index is 24.41 kg/m.  ECOG Performance Status: 1 - Symptomatic but completely ambulatory  General appearance: alert, cooperative and appears stated age, pale HEENT: ATNC Lymphatic survery; negative [place], axillary, and inguinal nodes Abdomen: soft, nondistended and nontender, no mass/ascites/hernias. Ostomy bag intact,  Incision well healed. SubQ nodules secondary to injections Extremities: extremities atraumatic, resolved edema Neurological exam reveals alert, oriented, normal speech, no focal findings or movement disorder noted.  Pelvic: EGBUS/Vagina: normal. Vaginal cuff intact. No gross lesions. Uterus/cervic surgically absent.  Bimanual: no nodularity on the right pelvis, there was a prior nodule but that is not palpable on exam. The left side is also negative. RV confirms.    Lab Review Lab Results  Component Value Date   CA125 47.3 (H) 10/10/2016     Lab Results  Component Value Date   WBC 4.8 11/07/2016   HGB 9.2 (L) 11/14/2016   HCT 25.7 (L) 11/07/2016   MCV 89.5 11/07/2016   PLT 303 11/07/2016     Chemistry      Component Value Date/Time   NA 134 (L) 11/14/2016 0834   NA 134 (L) 09/27/2012 0459   K 4.0 11/14/2016 0834   K 4.5 09/27/2012 0459   CL 97 (L) 11/14/2016 0834   CL 100 09/27/2012 0459   CO2  30 11/14/2016 0834   CO2 28 09/27/2012 0459   BUN 18 11/14/2016 0834   BUN 8 09/27/2012 0459   CREATININE 0.83 11/14/2016 0834   CREATININE 0.60 09/27/2012 0459      Component Value Date/Time   CALCIUM 9.4 11/14/2016 0834   CALCIUM 8.5 09/27/2012 0459   ALKPHOS 133 (H) 11/07/2016 0840   ALKPHOS 100 09/21/2012 1210   AST 15 11/07/2016 0840   AST 36 09/21/2012 1210   ALT 12 (L) 11/07/2016 0840   ALT 23 09/21/2012 1210  BILITOT 0.6 11/07/2016 0840   BILITOT 0.3 04/08/2016 0751   BILITOT 0.7 09/21/2012 1210         Assessment:  [name] is a 71 y.o. 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   Symptomatic right pleural effusion s/p thoracenteses complicated by right-sided empyema and perihepatic abscess, not clinically significant at this time.  Platinum-sensitive recurrence with evidence of response based on CA125. Pelvic exam noted for isolated nodule. Treated with carboplatin and gemcitabine chemotherapy. Progressive disease and platinum-resistance. Single-agent PLD and then topotecan sequential therapy, progressive disease.   History of partial SBO resolved with conservative management.    Medical co-morbidities complicating care: HTN, insulin dependent diabetes with SQ infusion pump, CAD, carotid disease, CVA, rheumatoid arthritis, asymptomatic gall stones, and symptomatic peripheral  neuropathy.   Plan:   Problem List Items Addressed This Visit      Genitourinary   Malignant neoplasm of ovary ([place]) - Primary     She will follow up with Dr Mike Gip for her [facility] results and discussion of continued chemotherapy.   I briefly reviewed goals of care and emphasized quality of life.   We can see her back in 3 months or as needed for repeat pelvic exam.   Gillis Ends, MD  CC:   Referring Provider: Lequita Asal, MD  Additional Care Provider: Dr. Harvest Dark. Dr. Percell Boston

## 2016-11-21 ENCOUNTER — Inpatient Hospital Stay: Payer: Medicare Other

## 2016-11-21 ENCOUNTER — Other Ambulatory Visit: Payer: Self-pay | Admitting: Hematology and Oncology

## 2016-11-21 VITALS — BP 138/72 | HR 82 | Temp 97.9°F | Resp 16

## 2016-11-21 DIAGNOSIS — Z7189 Other specified counseling: Secondary | ICD-10-CM

## 2016-11-21 DIAGNOSIS — C786 Secondary malignant neoplasm of retroperitoneum and peritoneum: Secondary | ICD-10-CM

## 2016-11-21 DIAGNOSIS — C801 Malignant (primary) neoplasm, unspecified: Secondary | ICD-10-CM

## 2016-11-21 DIAGNOSIS — T451X5A Adverse effect of antineoplastic and immunosuppressive drugs, initial encounter: Secondary | ICD-10-CM

## 2016-11-21 DIAGNOSIS — D6481 Anemia due to antineoplastic chemotherapy: Secondary | ICD-10-CM

## 2016-11-21 DIAGNOSIS — C569 Malignant neoplasm of unspecified ovary: Secondary | ICD-10-CM

## 2016-11-21 LAB — HEMOGLOBIN: Hemoglobin: 9.5 g/dL — ABNORMAL LOW (ref 12.0–16.0)

## 2016-11-21 LAB — MAGNESIUM: Magnesium: 1.4 mg/dL — ABNORMAL LOW (ref 1.7–2.4)

## 2016-11-21 MED ORDER — SODIUM CHLORIDE 0.9% FLUSH
10.0000 mL | INTRAVENOUS | Status: DC | PRN
  Administered 2016-11-21: 10 mL via INTRAVENOUS
  Filled 2016-11-21: qty 10

## 2016-11-21 MED ORDER — MAGNESIUM SULFATE 4 GM/100ML IV SOLN
4.0000 g | Freq: Once | INTRAVENOUS | Status: AC
  Administered 2016-11-21: 4 g via INTRAVENOUS
  Filled 2016-11-21: qty 100

## 2016-11-21 MED ORDER — HEPARIN SOD (PORK) LOCK FLUSH 100 UNIT/ML IV SOLN
500.0000 [IU] | Freq: Once | INTRAVENOUS | Status: AC
  Administered 2016-11-21: 500 [IU] via INTRAVENOUS
  Filled 2016-11-21: qty 5

## 2016-11-21 MED ORDER — HEPARIN SOD (PORK) LOCK FLUSH 100 UNIT/ML IV SOLN: 250.0000 [IU] | Freq: Once | INTRAVENOUS | Status: DC | PRN

## 2016-11-21 MED ORDER — EPOETIN ALFA 10000 UNIT/ML IJ SOLN
10000.0000 [IU] | Freq: Once | INTRAMUSCULAR | Status: AC
  Administered 2016-11-21: 10000 [IU] via SUBCUTANEOUS
  Filled 2016-11-21: qty 2

## 2016-11-23 ENCOUNTER — Telehealth: Payer: Self-pay | Admitting: Hematology and Oncology

## 2016-11-23 ENCOUNTER — Other Ambulatory Visit: Payer: Self-pay | Admitting: Hematology and Oncology

## 2016-11-23 NOTE — Telephone Encounter (Signed)
Re:  PD-L1 testing  PD-L1 testing performed by [facility] revealed a combined positive score of 5. Score of < 1 is negative Score of >= 1 is positive  I discussed the plan for pembolizumab 10 mg/kg every 2 weeks.  If progression, then referral to a clinical trial (4 possible trials). If unable to enroll in clinical trial, then Abraxne.  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  We discussed that her neuropathy could worsen with Abraxane.  I discussed the abstract/presentation at [facility] by Dr Althea Charon:    Pembrolizumab in patients (pts) with PD-L1-positive (PD-L1+) advanced ovarian cancer: Updated analysis of KEYNOTE-028.  In this study, a preliminary analysis of the ovarian cancer cohort of the KEYNOTE-028 study ([id number]) suggested that the PD-1 inhibitor pembrolizumab has promising antitumor activity in pts with PD-L1+advanced ovarian cancer. An updated analysis of the ovarian cancer cohort based on 15.5 months of follow-up is presented.   Methods: Key eligibility criteria for the ovarian cohort of this nonrandomized, multicohort phase Ib trial were advanced ovarian epithelial, fallopian tube, or primary peritoneal carcinoma; failure of prior therapy; PD-L1 positivity defined as membranous staining on ?1% of tumor and associated inflammatory cells or positive staining in stroma; and ECOG PS 0/1.   Pembrolizumab (10 mg/kg every 2 wk) was given for ?2 y or until confirmed progression/unacceptable toxicity. Response was assessed per RECIST v1.1 by investigators every 8 wk for the first 6 mo and every 12 wk thereafter. Primary end points were safety, tolerability, and confirmed ORR.   Results: 62 pts (median age, 69.5 y) were enrolled; 61.5% were white, 38.5% received ?5 therapies for recurrent/metastatic disease, and 53.8% received prior neoadjuvant/adjuvant therapies. As of the February 09, 2015, data cutoff, the median follow-up duration was 15.5 mo (range, 2.4-30.8 mo). 1 pt had a complete response  and 2 had partial responses; 6 pts had stable disease as best response. ORR was 11.5% (95% CI, 2.4%-30.2%). Tumor reduction was observed in 6/26 (23.1%); all 3 patients who responded completed 2 years of treatment. Median duration of response was not reached (range, 24.9+ to 26.5+ mo). Median (95% CI) PFS and OS were 1.9 mo (1.8-3.2 mo) and 13.1 mo (6.7-17.5 mo) respectively.   Treatment-related AEs occurred in 73.1% of pts, and the most common were arthralgia (19.2%), nausea (15.4%), pruritus (15.4%), rash (11.5%), and diarrhea (11.5%). 1 patient had a grade 3 drug-related adverse event (transaminase increased).   Conclusions: With 15.5 mo of follow-up, pembrolizumab continued to be well tolerated and demonstrated durable antitumor activity in pts with advanced ovarian cancer. Clinical trial information: [id number]  Patient was in agreement of preceding with pembolizumab.  Potential side effects were reviewed.

## 2016-11-27 NOTE — Progress Notes (Signed)
[facility] day:  11/28/2016   Chief Complaint: [name] is a 71 y.o. female with stage IV ovarian cancer who is seen for 2 week assessment.  HPI:  The patient was last seen in the medical oncology clinic on 11/14/2016.  At that time, she continued off therapy.  PDL-1 testing was pending.  We discuss pembrolizumab if her tumor was PDL-1 positive or Abraxane.  She met with Dr. Theora Gianotti on 11/16/2016.  Enrollment on a clinical trial was suggested if she was PDL-1 negative.  PDL-1 testing returned on 11/23/2016.  Combined score was 5 (>=1 positive).    Data from the KEYNOTE-028 study for patients with ovarian cancer was reviewed over the phone.  Pembrolizumab (10 mg/kg every 2 wk) was given for ?2 y or until confirmed progression/unacceptable toxicity.  26 pts (median age, 86.5 y) were enrolled.  38.5% received ?5 therapies for recurrent/metastatic disease. As of the 02/09/2015, data cutoff, the median follow-up duration was 15.5 mo (range, 2.4-30.8 mo). 1 pt had a complete response and 2 had partial responses; 6 pts had stable disease as best response. ORR was 11.5% (95% CI, 2.4%-30.2%). Tumor reduction was observed in 6/26 (23.1%); all 3 patients who responded completed 2 years of treatment. Median duration of response was not reached (range, 24.9+ to 26.5+ mo). Median (95% CI) PFS and OS were 1.9 mo (1.8-3.2 mo) and 13.1 mo (6.7-17.5 mo) respectively. Treatment-related AEs occurred in 73.1% of pts.  The most common AEs were arthralgia (19.2%), nausea (15.4%), pruritus (15.4%), rash (11.5%), and diarrhea (11.5%). 1 patient had a grade 3 drug-related adverse event (transaminase increased).   During the interim, she has received weekly magnesium.  Symptomatically, she is feeling better today than yesterday. She feels extremely tired and a little bit short of breath. She notes soreness at the "top of her stomach" and in the right upper quadrant. She has some  nausea but no emesis.  Blood sugar has been elevated, possibly due to a "bad site" of her insulin pump.   Past Medical History:  Diagnosis Date  . CAD (coronary artery disease)    a. 09/2012 Cath: LM nl, LAD 95p, 58m LCX 945mOM2 50, RCA 100.  . Marland Kitchenholelithiasis   . Chronic diastolic CHF (congestive heart failure) ([facility]  . Collagen vascular disease (HCC)    Rheumatoid Arthritis.  . Esophageal stricture   . Exertional shortness of breath   . GERD (gastroesophageal reflux disease)   . H/O hiatal hernia   . Herniated disc   . History of pancytopenia   . Hyperlipidemia   . Hypertension   . Hypokalemia   . Leukopenia 2012   s/p bone marrow biopsy, Dr. PaMa Hillock. NSTEMI (non-ST elevated myocardial infarction) ([facility]5/2014   "mild" (10/18/2012)  . Ovarian cancer ([facility]2016   chemo and hysterectomy  . Pneumonia 2013; 08/2012   "one lung; double" (10/18/2012)  . Rheumatoid arthritis(714.0)   . Type I diabetes mellitus ([facility]   "dx'd in 1957" (10/18/2012)    Past Surgical History:  Procedure Laterality Date  . ABDOMINAL HYSTERECTOMY  06/15/2015   Dx L/S, EXLAP TAH BSO omentectomy RSRx colostomy diaphragm resection stripping  . CARDIAC CATHETERIZATION  10/18/2012   "first one was today" (10/18/2012)  . CATARACT EXTRACTION W/ INTRAOCULAR LENS IMPLANT Right 2010  . CHOLECYSTECTOMY  06/15/2015   combined case with ovarian cancer debulking  . COLON SURGERY    . CORONARY ARTERY BYPASS GRAFT  N/A 10/19/2012   Procedure: CORONARY ARTERY BYPASS GRAFTING (CABG);  Surgeon: Melrose Nakayama, MD;  Location: [facility];  Service: Open Heart Surgery;  Laterality: N/A;  . ESOPHAGEAL DILATION     "3 or 4 times" (10/19/2011)  . ESOPHAGOGASTRODUODENOSCOPY  2012   Dr. Minna Merritts  . OSTOMY    . OVARY SURGERY     removal  . PERIPHERAL VASCULAR CATHETERIZATION N/A 03/02/2015   Procedure: Glori Luis Cath Insertion;  Surgeon: Algernon Huxley, MD;  Location: [place] [facility];  Service: Cardiovascular;  Laterality: N/A;   . TUBAL LIGATION  1970    Family History  Problem Relation Age of Onset  . Diabetes Mother   . Arthritis Mother   . Diabetes Father   . Arthritis Father   . Bone cancer Sister     Social History:  reports that she quit smoking about 22 years ago. Her smoking use included Cigarettes. She has a 30.00 pack-year smoking history. She has never used smokeless tobacco. She reports that she does not drink alcohol or use drugs.  She is accompanied by her husband today.  Allergies:  Allergies  Allergen Reactions  . Codeine Nausea And Vomiting  . Latex Rash    Current Medications: Current Outpatient Prescriptions  Medication Sig Dispense Refill  . Calcium-Vitamin D 600-200 MG-UNIT tablet Take 1 tablet by mouth 2 (two) times daily.     . cholecalciferol (VITAMIN D) 1000 units tablet Take 1,000 Units by mouth daily.    Marland Kitchen enoxaparin (LOVENOX) 60 MG/0.6ML injection INJECT 0.6 ML (60 MG TOTAL) INTO THE SKIN EVERY 12 HOURS 60 mL 2  . folic acid (FOLVITE) 1 MG tablet Take 1 tablet (1 mg total) by mouth daily. 90 tablet 3  . furosemide (LASIX) 80 MG tablet Take 0.5 tablets by mouth 2 (two) times daily.    . insulin lispro (HUMALOG) 100 UNIT/ML injection Inject 0.06 mLs (6 Units total) into the skin daily. (Patient taking differently: Inject 0-85 Units into the skin daily. Via insulin pump) 30 mL 3  . lidocaine-prilocaine (EMLA) cream APPLY 1 APPLICATION TOPICALLY AS NEEDED 1 HOUR PRIOR TO TREATMENT. COVER WITH PRESS N SEAL UNTIL TREATMENT TIME AS DIRECTED 30 g 1  . loratadine (CLARITIN) 10 MG tablet Take 1 tablet by mouth daily as needed.    . magnesium oxide (MAG-OX) 400 (241.3 Mg) MG tablet TAKE 1 TABLET BY MOUTH ONCE DAILY 30 tablet 1  . metoprolol tartrate (LOPRESSOR) 25 MG tablet Take 1 tablet (25 mg total) by mouth 2 (two) times daily. 180 tablet 3  . ondansetron (ZOFRAN) 8 MG tablet TAKE 1 TABLET BY MOUTH EVERY 8 HOURS AS NEEDED FOR NAUSEA OR VOMITING 30 tablet 1  . pantoprazole (PROTONIX)  40 MG tablet TAKE 1 TABLET BY MOUTH TWICE (2) DAILY 180 tablet 1  . potassium chloride SA (K-DUR,KLOR-CON) 20 MEQ tablet TAKE 1 TABLET BY MOUTH DAILY 30 tablet 2  . predniSONE (DELTASONE) 5 MG tablet Take 5 mg by mouth daily with breakfast.    . vitamin C (ASCORBIC ACID) 500 MG tablet Take 500 mg by mouth daily.    Marland Kitchen zinc gluconate 50 MG tablet Take 50 mg by mouth daily.     Current Facility-Administered Medications  Medication Dose Route Frequency Provider Last Rate Last Dose  . Tbo-Filgrastim (GRANIX) injection 300 mcg  300 mcg Subcutaneous Once Lequita Asal, MD       Facility-Administered Medications Ordered in Other Visits  Medication Dose Route Frequency Provider Last Rate  Last Dose  . heparin lock flush 100 unit/mL  500 Units Intravenous Once Corcoran, Melissa C, MD      . sodium chloride 0.9 % injection 10 mL  10 mL Intracatheter PRN Corcoran, Melissa C, MD      . sodium chloride 0.9 % injection 10 mL  10 mL Intravenous PRN Lequita Asal, MD   10 mL at 04/13/15 0233    Review of Systems:  GENERAL:  Feels "better today".  No fever, chills or sweats.  Weight down 4 pounds. PERFORMANCE STATUS (ECOG):  1 HEENT:  No visual changes, sore throat, mouth sores or tenderness. Lungs:  Little shortness of breath.  No cough.  No hemoptysis. Cardiac:  No chest pain, palpitations, orthopnea, or PND.  Took BP pill this AM. GI:  Upper abdominal pain.  No nausea, vomiting, diarrhea, melena or hematochezia. GU:  No urgency, frequency, dysuria, or hematuria. Musculoskeletal:  Severe rheumatoid arthritis (off MTX and on prednisone).  Osteoporosis.  No back pain. No muscle tenderness. Extremities:  No pain or swelling. Skin:  No increased bruising or bleeding.  No rashes or skin changes. Neuro:  Neuropathy (stable).  No headache, numbness or weakness, balance or coordination issues. Endocrine:  Diabetes on an insulin pump.  Blood sugar elevated.  Occasional hot flashes.  Psych:  No mood  changes, depression or anxiety. Pain: No pain. Review of systems:  All other systems reviewed and found to be negative.  Physical Exam:  Blood pressure 112/66, pulse 78, temperature (!) 97.1 F (36.2 C), temperature source Tympanic, resp. rate 18, weight 133 lb 2 oz (60.4 kg). GENERAL:  Well developed, well nourished woman sitting comfortably in the exam room in no acute distress. MENTAL STATUS:  Alert and oriented to person, place and time. HEAD:  Wearing a pink cap.  Alopecia.  Normocephalic, atraumatic, face symmetric, no Cushingoid features. EYES:  Gold rimmed glasses.  Blue eyes.  No conjunctivitis or scleral icterus.  ENT: Oropharynx clear without lesion. Tongue normal. Mucous membranes moist.  RESPIRATORY: Clear to auscultation without rales, wheezes or rhonchi. CARDIOVASCULAR: Regular rate and rhythm without murmur, rub or gallop. ABDOMEN: Soft, slightly tender in the LUQ.  No guarding or rebound tenderness. Active bowel sounds and no hepatosplenomegaly. No palpable nodularity or masses.  Insulin pump.  Ostomy bag. SKIN: Slight bruising on abdomen s/p Lovenox injections. RLE bruise.  No rashes or ulcers. EXTREMITIES:  No edema, skin discoloration or tenderness. No palpable cords. LYMPH NODES: No palpable cervical, supraclavicular, axillary or inguinal adenopathy  NEUROLOGICAL: Appropriate. PSYCH: Appropriate.  Radiology studies: 02/13/2015:  Abdomen and pelvic CT revealed bilateral mass-like adnexal regions (right adnexal mass 5.6 x 5.0 cm and the left adnexa mass 10.3 x 6.0 x 9.9 cm).  There was a large amount of soft tissue throughout the peritoneal cavity involving the omentum and other peritoneal surfaces.  There was a small volume ascites. There was a peripheral 3.3 x 1.9 cm low-attenuation lesion overlying the right lobe of the liver, likely representing a serosal implant. There was 1.4 x 2.2 cm ill-defined peripheral lesion within the inferior aspect of segment 6  adjacent to the ampulla in the duodenum.  04/28/2015:  Abdominal and pelvic CT revealed decreasing bilateral ovarian masses.   The left adnexal mass measured 4.0 x 6.1 cm (previously 5.0 x 8.5 cm). The right ovary measured 3.8 x 4.9 cm (previously 4.7 x 5.6 cm).  There was improved peritoneal carcinomatosis.  There was a small amount of ascites.  The hepatic  dome lesion was stable. The previously seen right hepatic lobe lesion was not well-visualized.  There was a small left pleural effusion and trace right pleural effusion.  The nodular lesion at the ampulla of Vater, extending into the duodenum was stable.  There was no biliary ductal dilatation. 08/01/2015:  Rght upper extremity ultrasound revealed a near occlusive thrombus within the central portion of the right internal jugular vein and central portion of the right subclavian vein. 09/01/2015:  Chest, abdomen, and pelvic CT revealed continued decrease in perihepatic fluid collection contiguous with the right pleural space with percutaneous drain.  09/11/2015:  Chest, abdomen, and pelvic CT revealed a residual versus recurrent fluid collection posterior to the right hepatic lobe (2.6 x 1.1 x 4.0 cm).   10/13/2015:  Chest, abdomen, and pelvic CT revealed resolution of the empyema. 02/15/2016:  Chest, abdomen, and pelvic CT revealed multiple soft tissue nodules throughout the pelvis, small bowel mesenteric and peritoneum and, concerning for peritoneal metastatic disease.  There was soft tissue irregularity within the left upper quadrant.  There was mild right hydronephrosis (etiology unclear).  There was interval resolution of previously described right pleural based fluid and gas collection. There was a pleural based nodule within the right lower hemi-thorax concerning for pleural based metastasis.  There were small bilateral pleural effusions.  There was pulmonary nodularity, predominately within the left upper lobe (metastatic or infectious/inflammatory  etiology).  There was slightly increased mediastinal adenopathy (infectious/inflammatory or metastatic).  There was a low attenuation lesion within the left hepatic lobe (complicated fluid within the fissure or metastatic disease). 03/08/2016:  Abdomen and pelvic CT revealed mild progression of peritoneal disease in the abdomen.  There was interval increase in loculated fluid around the lateral segment left liver, stomach, and spleen.  There was persistent soft tissue lesion at the level of the ampulla with mild intra and extrahepatic biliary duct dilatation.  There was persistent intrahepatic and capsular metastatic disease involving the liver. 05/26/2016:  Head MRI revealed multiple small areas of acute infarct involving the occipital parietal lobe bilaterally and the left lateral cerebellum,  consistent with posterior circulation emboli.  06/10/2016:  Adomen and pelvic CT revealed progression of peritoneal carcinomatosis predominantly in the perihepatic space, bilateral lower quadrants and pelvis.  There was worsening bilateral obstructive uropathy due to malignant involvement of the pelvic ureters.  There was stable small pleural/subpleural nodules at the right lung base.  There was stable small left pleural effusion.  There was decreased small volume perihepatic ascites.  08/09/2016:  Abdomen and pelvic CT revealed interval increase and loculated fluid collections within the peritoneal space consistent with progression of peritoneal ovarian carcinoma metastasis.  There was one large 7 cm collection in the RIGHT lower quadrant which had increased significantly in size and may represent of point of small bowel obstruction.  The proximal stomach and duodenum were decompressed. The mid small bowel was mildly dilated. There was concern for obstruction given the large intraperitoneal fluid collection.  There was increase in subcapsular hepatic metastatic fluid collections.  There was enlargement of rounded  ampullary lesion in the second portion duodenum concerning for metastatic lesion.  There was mild biliary duct dilatation (stable).  There was nodular pleural metastasis in the RIGHT lower lobe pleural space. 11/03/2016:  Abdomen and pelvic CT revealed multifocal cystic metastases in the abdomen/pelvis, reflecting peritoneal disease.  The dominant lesion in the left anterior abdomen has progressed, while additional lesions were mixed.  Peritoneal disease along the liver and spleen progressed.  Pleural-based metastases along the posterior right hemithorax progressed. There were trace left pleural effusion.  There was a stable 1.7 cm duodenal lesion at the ampulla.  Admissions: [place] from 06/15/2015 - 06/22/2015.  She underwent exploratory laparotomy, lysis of adhesions, total abdominal hysterectomy with bilateral salpingo-oophorectomy, infracolic omentectomy, optimal tumor debulking(< 1 cm), recto-sigmoid resection with creation of end colostomy, cholecystectomy, mobilization of splenic flexure and liver with diaphragmatic stripping on 06/15/2015. The right diaphragm was cleared of tumor. During dissection, the diaphragm was entered and closed with sutures.  [place] from 07/01/2015 - 07/03/2015 with pleural effusion. [place] from 07/07/2015 - 07/09/2015 with recurrent pleural effusion. [place] from 07/29/2015 - 08/10/2015 with a right-sided empyema and liver abscess. She underwent CT-guided placement of a liver abscess drain on 07/30/2015.  Liver abscess culture grew out group B strep and Enterobacter which was sensitive to Zosyn. She was transitioned to ertapenem Colbert Ewing) prior to discharge.  She was readmitted to [facility] on 09/17/2015. 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  Head MRI on 05/26/2016 revealed multiple small areas of acute infarct involving the occipital parietal lobe bilaterally and the left lateral cerebellum,  consistent with posterior circulation  emboli.  Work-up included a negative carotid ultrasound and echocardiogram. [facility] from 08/10/2016 - 08/14/2016 with a small bowel obstruction.  She was managed conservatively. [place] from 09/27/2016 - 09/28/2016 with symptomatic anemia and diarrhea.   Infusion on 11/28/2016  Component Date Value Ref Range Status  . Magnesium 11/28/2016 1.4* 1.7 - 2.4 mg/dL Final  . WBC 11/28/2016 4.2  3.6 - 11.0 K/uL Final  . RBC 11/28/2016 3.26* 3.80 - 5.20 MIL/uL Final  . Hemoglobin 11/28/2016 10.0* 12.0 - 16.0 g/dL Final  . HCT 11/28/2016 28.9* 35.0 - 47.0 % Final  . MCV 11/28/2016 88.8  80.0 - 100.0 fL Final  . MCH 11/28/2016 30.7  26.0 - 34.0 pg Final  . MCHC 11/28/2016 34.6  32.0 - 36.0 g/dL Final  . RDW 11/28/2016 17.3* 11.5 - 14.5 % Final  . Platelets 11/28/2016 233  150 - 440 K/uL Final  . Neutrophils Relative % 11/28/2016 72  % Final  . Neutro Abs 11/28/2016 3.0  1.4 - 6.5 K/uL Final  . Lymphocytes Relative 11/28/2016 12  % Final  . Lymphs Abs 11/28/2016 0.5* 1.0 - 3.6 K/uL Final  . Monocytes Relative 11/28/2016 13  % Final  . Monocytes Absolute 11/28/2016 0.5  0.2 - 0.9 K/uL Final  . Eosinophils Relative 11/28/2016 2  % Final  . Eosinophils Absolute 11/28/2016 0.1  0 - 0.7 K/uL Final  . Basophils Relative 11/28/2016 1  % Final  . Basophils Absolute 11/28/2016 0.1  0 - 0.1 K/uL Final  . Sodium 11/28/2016 132* 135 - 145 mmol/L Final  . Potassium 11/28/2016 4.0  3.5 - 5.1 mmol/L Final  . Chloride 11/28/2016 94* 101 - 111 mmol/L Final  . CO2 11/28/2016 28  22 - 32 mmol/L Final  . Glucose, Bld 11/28/2016 308* 65 - 99 mg/dL Final  . BUN 11/28/2016 22* 6 - 20 mg/dL Final  . Creatinine, Ser 11/28/2016 0.92  0.44 - 1.00 mg/dL Final  . Calcium 11/28/2016 9.3  8.9 - 10.3 mg/dL Final  . GFR calc non Af Amer 11/28/2016 >60  >60 mL/min Final  . GFR calc Af Amer 11/28/2016 >60  >60 mL/min Final   Comment: (NOTE) The eGFR has been calculated using the CKD EPI equation. This calculation has not been  validated in all clinical situations.  eGFR's persistently <60 mL/min signify possible Chronic Kidney Disease.   . Anion gap 11/28/2016 10  5 - 15 Final    Assessment:  ZIANNE SCHUBRING is a 71 y.o. female with progressive stage IV ovarian cancer.  She presented with abdominal discomfort and bloating.  Omental biopsy on 02/23/2015 revealed metastatic high grade serous carcinoma, consistent with gynecologic origin.   She was initially diagnosed with clinical stage IIIC (T3cN1Mx).  CA125 was 707 on 02/17/2015.  She received 4 cycles of neoadjuvant carboplatin and Taxol (03/05/2015 - 05/22/2015).  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  She had loose stools on oral magnesium.  She was initially on Neurontin then switched Lyrica with cycle #3.  Cycle #4 was notable for neutropenia (ANC 300) requiring GCSF x 3 days.    She underwent exploratory laparotomy, lysis of adhesions, total abdominal hysterectomy with bilateral salpingo-oophorectomy, infracolic omentectomy, optimal tumor debulking(< 1 cm), recto-sigmoid resection with creation of end colostomy, cholecystectomy, mobilization of splenic flexure and liver with diaphragmatic stripping on 06/15/2015. The right diaphragm was cleared of tumor. During dissection, the diaphragm was entered and closed with sutures.  She received tamoxifen from 02/25/2016 - 03/14/2016.  She received 4 cycles of carboplatin and gemcitabine (03/21/2016 - 05/24/2016) with GCSF/Neulasta support.  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  She received 2 cycles of Doxil (06/21/2016 - 07/19/2016) with Neulasta support.  She received 4 cycles of topotecan with Neulasta support (08/22/2016 - 10/10/2016).  Abdomen and pelvic CT scan on 11/03/2016 revealed multifocal cystic metastases in the abdomen/pelvis, reflecting peritoneal disease.  The dominant lesion in the left anterior abdomen has progressed, while additional lesions were mixed.  Peritoneal  disease along the liver and spleen progressed.  Pleural-based metastases along the posterior right hemithorax progressed. There were trace left pleural effusion.  There was a stable 1.7 cm duodenal lesion at the ampulla.  CA125 was 802.9 on 03/30/2015, 567.9 on 04/13/2015, 168.8 on 05/15/2015, 85.2 on 07/17/2015, 68.6 on 07/28/2015, 34.5 on 09/17/2015, 20.7 on 11/06/2015, 49 on 01/22/2016, 106.1 on 02/22/2016, 138.8 on 03/07/2016, 220.8 on 03/21/2016, 152.8 on 04/11/2016, 125.6 on 04/18/2016, 76.8 on 05/03/2016, 60.2 on 05/24/2016, 44 on 07/19/2016, 65.8 on 08/17/2016, 53.4 on 09/12/2016, and 47.3 on 10/10/2016.  PDL-1 testing revealed a combined score was 5 (>=1 positive).     She has a history of recurrent right sided pleural effusion.  She underwent thoracentesis of 650 cc on post-operative day 3.  She was [facility] to [facility] on [facility]/2015 and 07/07/2015 for recurrent shortness of breath.  She underwent 2 additional thoracenteses (1.1 L on 07/02/2015 and 850 cc on 07/08/2015).  Cytology was negative x 2.  Bilateral lower extremity duplex on 07/03/2015 was negative.  Echo revealed an EF of 55-60% on 07/08/2015 and 60-65% on 05/27/2016.    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 She was on Lovenox 60 mg twice a day.  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  She has severe rheumatoid arthritis.  Methotrexate and Enbrel were initially on hold.  She has a normocytic anemia.  Work-up on 02/17/2015 and 05/24/2016 revealed a normal ferritin, B12, folate, TSH.  She denies any melena or hematochezia.    She has anemia due to chronic disease. She received 1 unit PRBCs during her admission at [facility]. She denies any  melena or hematochezia. She has diabetes and is on an insulin pump.  She has had persistent neutropenia felt  secondary to her rheumatoid arthritis.  Folate and MMA were normal.  TSH was 6.13 (high) with a free T4 of 1.17 (0.61-1.12).  She began methotrexate (10 mg a week) and prednisone (5 mg a day) for severe rheumatoid arthritis on 12/17/2015.  She remains on prednisone alone (5 mg a day).  Bone marrow aspirate and biopsy on 06/09/2010 revealed a hypercellular marrow (70%) with no evidence of dysplasia or malignancy.  Flow cytometry was negative.  Cytogenetics were normal (46,XX).  FISH studies were negative for MDS.  Bone marrow aspirate and biopsy on 12/03/2015 revealed a normocellular to mildy hypercellular marrow for age (40%) with left shifted myelopoiesis, non specific dyserythropoiesis and mild megakaryocytic atypia with no increase in blasts.  There were multiple small nonspecific lymphoid aggregates (favor reactive). There was no increase in reticulin.  There was decreased myeloid cells (37%) with left shifted maturation and 1% atypical myelod blasts.  There was relatively increased monocytic cells (11%), relatively increased lymphoid cells (36%), and relatively increased eosinophils (6%).  Cytogenetics were normal (61, XX).  SNP microarray was normal.  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  She receives IV magnesium weekly.  She has chemotherapy induced anemia.  She has received Procrit (last 11/07/2016).  Labs on 08/22/2016 revealed a normal B12 and folate.  Ferritin was 37, iron saturation 11% and TIBC 281.  Code status is DNR/DNI.  Symptomatically, she notes upper abdominal discomfort x 1 day.  Exam is stable.  She has chemotherapy induced anemia.  She has chronic hypomagnesemia (1.4).   Plan: 1.  Labs today:  CBC with diff, CMP, Mg, TSH. 2.  Review PDL-1 testing and plan for pemprolizumab.  Potential side effects and administration reviewed.  Results from [place] study were reviewed.  Treatment is palliative.  Discuss plan for clinical trial enrollment and possible  Abraxane if unsuccessful.  Patient in agreement. 3.  Magnesium sulfate 4 gm IV today. 4.  RTC this week for pembolizumab.  Confirm dosing with pharmacy. 5.. RTC in 1 week for MD assess, labs (CBC with diff, BMP, Mg) +/- IV Mg.  Addendum:  New increased LFTs on today's labs.  Will send hepatitis serologies and perform RUQ ultrasound.  Consider abdominal/pelvic CT.  Postpone treatment until issue resolved.  Concern LFTs represent progressive liver disease.   Lequita Asal, MD  11/28/2016, 10:07 AM

## 2016-11-28 ENCOUNTER — Encounter: Payer: Self-pay | Admitting: Hematology and Oncology

## 2016-11-28 ENCOUNTER — Inpatient Hospital Stay: Payer: Medicare Other

## 2016-11-28 ENCOUNTER — Other Ambulatory Visit: Payer: Self-pay | Admitting: *Deleted

## 2016-11-28 ENCOUNTER — Telehealth: Payer: Self-pay | Admitting: *Deleted

## 2016-11-28 ENCOUNTER — Inpatient Hospital Stay (HOSPITAL_BASED_OUTPATIENT_CLINIC_OR_DEPARTMENT_OTHER): Payer: Medicare Other | Admitting: Hematology and Oncology

## 2016-11-28 VITALS — BP 112/66 | HR 78 | Temp 97.1°F | Resp 18 | Wt 133.1 lb

## 2016-11-28 DIAGNOSIS — C569 Malignant neoplasm of unspecified ovary: Secondary | ICD-10-CM

## 2016-11-28 DIAGNOSIS — C801 Malignant (primary) neoplasm, unspecified: Principal | ICD-10-CM

## 2016-11-28 DIAGNOSIS — K449 Diaphragmatic hernia without obstruction or gangrene: Secondary | ICD-10-CM

## 2016-11-28 DIAGNOSIS — T451X5A Adverse effect of antineoplastic and immunosuppressive drugs, initial encounter: Secondary | ICD-10-CM

## 2016-11-28 DIAGNOSIS — Z90710 Acquired absence of both cervix and uterus: Secondary | ICD-10-CM

## 2016-11-28 DIAGNOSIS — I5032 Chronic diastolic (congestive) heart failure: Secondary | ICD-10-CM

## 2016-11-28 DIAGNOSIS — R7989 Other specified abnormal findings of blood chemistry: Secondary | ICD-10-CM

## 2016-11-28 DIAGNOSIS — Z8719 Personal history of other diseases of the digestive system: Secondary | ICD-10-CM

## 2016-11-28 DIAGNOSIS — Z86718 Personal history of other venous thrombosis and embolism: Secondary | ICD-10-CM

## 2016-11-28 DIAGNOSIS — K219 Gastro-esophageal reflux disease without esophagitis: Secondary | ICD-10-CM | POA: Diagnosis not present

## 2016-11-28 DIAGNOSIS — I110 Hypertensive heart disease with heart failure: Secondary | ICD-10-CM

## 2016-11-28 DIAGNOSIS — E785 Hyperlipidemia, unspecified: Secondary | ICD-10-CM

## 2016-11-28 DIAGNOSIS — I252 Old myocardial infarction: Secondary | ICD-10-CM

## 2016-11-28 DIAGNOSIS — Z79899 Other long term (current) drug therapy: Secondary | ICD-10-CM | POA: Diagnosis not present

## 2016-11-28 DIAGNOSIS — I2510 Atherosclerotic heart disease of native coronary artery without angina pectoris: Secondary | ICD-10-CM

## 2016-11-28 DIAGNOSIS — M069 Rheumatoid arthritis, unspecified: Secondary | ICD-10-CM

## 2016-11-28 DIAGNOSIS — C7800 Secondary malignant neoplasm of unspecified lung: Secondary | ICD-10-CM

## 2016-11-28 DIAGNOSIS — D6481 Anemia due to antineoplastic chemotherapy: Secondary | ICD-10-CM

## 2016-11-28 DIAGNOSIS — C786 Secondary malignant neoplasm of retroperitoneum and peritoneum: Secondary | ICD-10-CM

## 2016-11-28 DIAGNOSIS — Z90722 Acquired absence of ovaries, bilateral: Secondary | ICD-10-CM

## 2016-11-28 DIAGNOSIS — Z7189 Other specified counseling: Secondary | ICD-10-CM

## 2016-11-28 DIAGNOSIS — Z8673 Personal history of transient ischemic attack (TIA), and cerebral infarction without residual deficits: Secondary | ICD-10-CM

## 2016-11-28 DIAGNOSIS — G629 Polyneuropathy, unspecified: Secondary | ICD-10-CM

## 2016-11-28 DIAGNOSIS — Z66 Do not resuscitate: Secondary | ICD-10-CM

## 2016-11-28 DIAGNOSIS — Z7901 Long term (current) use of anticoagulants: Secondary | ICD-10-CM

## 2016-11-28 DIAGNOSIS — Z794 Long term (current) use of insulin: Secondary | ICD-10-CM

## 2016-11-28 DIAGNOSIS — R945 Abnormal results of liver function studies: Secondary | ICD-10-CM

## 2016-11-28 DIAGNOSIS — Z87891 Personal history of nicotine dependence: Secondary | ICD-10-CM

## 2016-11-28 DIAGNOSIS — Z7952 Long term (current) use of systemic steroids: Secondary | ICD-10-CM

## 2016-11-28 DIAGNOSIS — E876 Hypokalemia: Secondary | ICD-10-CM

## 2016-11-28 DIAGNOSIS — J90 Pleural effusion, not elsewhere classified: Secondary | ICD-10-CM

## 2016-11-28 DIAGNOSIS — Z9221 Personal history of antineoplastic chemotherapy: Secondary | ICD-10-CM

## 2016-11-28 DIAGNOSIS — Z87442 Personal history of urinary calculi: Secondary | ICD-10-CM

## 2016-11-28 DIAGNOSIS — E108 Type 1 diabetes mellitus with unspecified complications: Secondary | ICD-10-CM

## 2016-11-28 LAB — CBC WITH DIFFERENTIAL/PLATELET
Basophils Absolute: 0.1 10*3/uL (ref 0–0.1)
Basophils Relative: 1 %
Eosinophils Absolute: 0.1 10*3/uL (ref 0–0.7)
Eosinophils Relative: 2 %
HCT: 28.9 % — ABNORMAL LOW (ref 35.0–47.0)
Hemoglobin: 10 g/dL — ABNORMAL LOW (ref 12.0–16.0)
Lymphocytes Relative: 12 %
Lymphs Abs: 0.5 10*3/uL — ABNORMAL LOW (ref 1.0–3.6)
MCH: 30.7 pg (ref 26.0–34.0)
MCHC: 34.6 g/dL (ref 32.0–36.0)
MCV: 88.8 fL (ref 80.0–100.0)
Monocytes Absolute: 0.5 10*3/uL (ref 0.2–0.9)
Monocytes Relative: 13 %
Neutro Abs: 3 10*3/uL (ref 1.4–6.5)
Neutrophils Relative %: 72 %
Platelets: 233 10*3/uL (ref 150–440)
RBC: 3.26 MIL/uL — ABNORMAL LOW (ref 3.80–5.20)
RDW: 17.3 % — ABNORMAL HIGH (ref 11.5–14.5)
WBC: 4.2 10*3/uL (ref 3.6–11.0)

## 2016-11-28 LAB — MAGNESIUM: Magnesium: 1.4 mg/dL — ABNORMAL LOW (ref 1.7–2.4)

## 2016-11-28 LAB — BASIC METABOLIC PANEL
Anion gap: 10 (ref 5–15)
BUN: 22 mg/dL — ABNORMAL HIGH (ref 6–20)
CO2: 28 mmol/L (ref 22–32)
Calcium: 9.3 mg/dL (ref 8.9–10.3)
Chloride: 94 mmol/L — ABNORMAL LOW (ref 101–111)
Creatinine, Ser: 0.92 mg/dL (ref 0.44–1.00)
GFR calc Af Amer: >60 mL/min (ref >60)
GFR calc non Af Amer: >60 mL/min (ref >60)
Glucose, Bld: 308 mg/dL — ABNORMAL HIGH (ref 65–99)
Potassium: 4 mmol/L (ref 3.5–5.1)
Sodium: 132 mmol/L — ABNORMAL LOW (ref 135–145)

## 2016-11-28 LAB — HEPATIC FUNCTION PANEL
ALT: 218 U/L — ABNORMAL HIGH (ref 14–54)
AST: 111 U/L — ABNORMAL HIGH (ref 15–41)
Albumin: 3.2 g/dL — ABNORMAL LOW (ref 3.5–5.0)
Alkaline Phosphatase: 394 U/L — ABNORMAL HIGH (ref 38–126)
Bilirubin, Direct: 0.2 mg/dL (ref 0.1–0.5)
Indirect Bilirubin: 0.5 mg/dL (ref 0.3–0.9)
Total Bilirubin: 0.7 mg/dL (ref 0.3–1.2)
Total Protein: 7.5 g/dL (ref 6.5–8.1)

## 2016-11-28 LAB — TSH: TSH: 2.372 u[IU]/mL (ref 0.350–4.500)

## 2016-11-28 MED ORDER — SODIUM CHLORIDE 0.9% FLUSH
10.0000 mL | Freq: Once | INTRAVENOUS | Status: AC
  Administered 2016-11-28: 10 mL via INTRAVENOUS
  Filled 2016-11-28: qty 10

## 2016-11-28 MED ORDER — SODIUM CHLORIDE 0.9 % IV SOLN
INTRAVENOUS | Status: DC
  Administered 2016-11-28: 11:00:00 via INTRAVENOUS
  Filled 2016-11-28: qty 1000

## 2016-11-28 MED ORDER — MAGNESIUM SULFATE 4 GM/100ML IV SOLN
4.0000 g | Freq: Once | INTRAVENOUS | Status: AC
  Administered 2016-11-28: 4 g via INTRAVENOUS
  Filled 2016-11-28: qty 100

## 2016-11-28 MED ORDER — HEPARIN SOD (PORK) LOCK FLUSH 100 UNIT/ML IV SOLN
500.0000 [IU] | Freq: Once | INTRAVENOUS | Status: AC
  Administered 2016-11-28: 500 [IU] via INTRAVENOUS
  Filled 2016-11-28: qty 5

## 2016-11-28 NOTE — Telephone Encounter (Signed)
Called patient and discussed lab results with patient and need for additional labs and need for  Ultrasound, voiced understanding, scheduler to call with appts.

## 2016-11-28 NOTE — Progress Notes (Signed)
Patient states yesterday upon awakening she had soreness in the top of her abdomen and back.  She took 1/2 of a fluid pill and was able to get rid of a lot of fluid.  She states she did not feel good and laid around a lot.  She c/o fatigue, decreased appetite, SOB and nausea.  She still does not feel much better today. States her glucose has been elevated also.

## 2016-11-29 ENCOUNTER — Ambulatory Visit
Admission: RE | Admit: 2016-11-29 | Discharge: 2016-11-29 | Disposition: A | Discharge status: Home / Self Care | Payer: Medicare Other | Source: Ambulatory Visit | Attending: Hematology and Oncology | Admitting: Hematology and Oncology

## 2016-11-29 ENCOUNTER — Inpatient Hospital Stay: Payer: Medicare Other | Attending: Hematology and Oncology

## 2016-11-29 ENCOUNTER — Ambulatory Visit: Payer: Medicare Other

## 2016-11-29 ENCOUNTER — Other Ambulatory Visit: Payer: Self-pay | Admitting: *Deleted

## 2016-11-29 ENCOUNTER — Inpatient Hospital Stay: Payer: Medicare Other

## 2016-11-29 ENCOUNTER — Other Ambulatory Visit: Payer: Self-pay | Admitting: Hematology and Oncology

## 2016-11-29 DIAGNOSIS — C782 Secondary malignant neoplasm of pleura: Secondary | ICD-10-CM | POA: Diagnosis not present

## 2016-11-29 DIAGNOSIS — C569 Malignant neoplasm of unspecified ovary: Secondary | ICD-10-CM | POA: Insufficient documentation

## 2016-11-29 DIAGNOSIS — C786 Secondary malignant neoplasm of retroperitoneum and peritoneum: Secondary | ICD-10-CM

## 2016-11-29 DIAGNOSIS — K7689 Other specified diseases of liver: Secondary | ICD-10-CM | POA: Diagnosis not present

## 2016-11-29 DIAGNOSIS — N1330 Unspecified hydronephrosis: Secondary | ICD-10-CM | POA: Insufficient documentation

## 2016-11-29 DIAGNOSIS — C801 Malignant (primary) neoplasm, unspecified: Secondary | ICD-10-CM

## 2016-11-29 LAB — CREATININE, SERUM
Creatinine, Ser: 0.63 mg/dL (ref 0.44–1.00)
GFR calc Af Amer: >60 mL/min (ref >60)
GFR calc non Af Amer: >60 mL/min (ref >60)

## 2016-11-29 MED ORDER — IOPAMIDOL (ISOVUE-300) INJECTION 61%
100.0000 mL | Freq: Once | INTRAVENOUS | Status: AC | PRN
  Administered 2016-11-29: 100 mL via INTRAVENOUS

## 2016-11-29 MED ORDER — HEPARIN SOD (PORK) LOCK FLUSH 100 UNIT/ML IV SOLN
500.0000 [IU] | Freq: Once | INTRAVENOUS | Status: AC
  Administered 2016-11-29: 500 [IU] via INTRAVENOUS

## 2016-11-29 MED ORDER — SODIUM CHLORIDE 0.9% FLUSH
10.0000 mL | INTRAVENOUS | Status: DC | PRN
  Administered 2016-11-29: 10 mL via INTRAVENOUS
  Filled 2016-11-29: qty 10

## 2016-11-30 ENCOUNTER — Encounter: Payer: Self-pay | Admitting: Hematology and Oncology

## 2016-11-30 ENCOUNTER — Inpatient Hospital Stay: Payer: Medicare Other | Admitting: Hematology and Oncology

## 2016-11-30 ENCOUNTER — Telehealth: Payer: Self-pay | Admitting: Pharmacist

## 2016-11-30 ENCOUNTER — Inpatient Hospital Stay: Payer: Medicare Other

## 2016-11-30 ENCOUNTER — Inpatient Hospital Stay: Payer: Medicare Other | Attending: Hematology and Oncology | Admitting: Hematology and Oncology

## 2016-11-30 ENCOUNTER — Telehealth: Payer: Self-pay | Admitting: *Deleted

## 2016-11-30 VITALS — BP 133/65 | HR 74 | Temp 97.1°F | Resp 18 | Wt 136.0 lb

## 2016-11-30 DIAGNOSIS — E876 Hypokalemia: Secondary | ICD-10-CM | POA: Insufficient documentation

## 2016-11-30 DIAGNOSIS — Z86718 Personal history of other venous thrombosis and embolism: Secondary | ICD-10-CM | POA: Diagnosis not present

## 2016-11-30 DIAGNOSIS — N1330 Unspecified hydronephrosis: Secondary | ICD-10-CM | POA: Diagnosis not present

## 2016-11-30 DIAGNOSIS — Z5112 Encounter for antineoplastic immunotherapy: Secondary | ICD-10-CM | POA: Diagnosis not present

## 2016-11-30 DIAGNOSIS — C782 Secondary malignant neoplasm of pleura: Secondary | ICD-10-CM

## 2016-11-30 DIAGNOSIS — C801 Malignant (primary) neoplasm, unspecified: Secondary | ICD-10-CM

## 2016-11-30 DIAGNOSIS — R7989 Other specified abnormal findings of blood chemistry: Secondary | ICD-10-CM

## 2016-11-30 DIAGNOSIS — K219 Gastro-esophageal reflux disease without esophagitis: Secondary | ICD-10-CM | POA: Insufficient documentation

## 2016-11-30 DIAGNOSIS — R945 Abnormal results of liver function studies: Principal | ICD-10-CM

## 2016-11-30 DIAGNOSIS — K449 Diaphragmatic hernia without obstruction or gangrene: Secondary | ICD-10-CM

## 2016-11-30 DIAGNOSIS — E785 Hyperlipidemia, unspecified: Secondary | ICD-10-CM | POA: Insufficient documentation

## 2016-11-30 DIAGNOSIS — C569 Malignant neoplasm of unspecified ovary: Secondary | ICD-10-CM | POA: Diagnosis not present

## 2016-11-30 DIAGNOSIS — D638 Anemia in other chronic diseases classified elsewhere: Secondary | ICD-10-CM

## 2016-11-30 DIAGNOSIS — I252 Old myocardial infarction: Secondary | ICD-10-CM | POA: Diagnosis not present

## 2016-11-30 DIAGNOSIS — Z66 Do not resuscitate: Secondary | ICD-10-CM | POA: Diagnosis not present

## 2016-11-30 DIAGNOSIS — E109 Type 1 diabetes mellitus without complications: Secondary | ICD-10-CM | POA: Diagnosis not present

## 2016-11-30 DIAGNOSIS — M069 Rheumatoid arthritis, unspecified: Secondary | ICD-10-CM | POA: Insufficient documentation

## 2016-11-30 DIAGNOSIS — R5383 Other fatigue: Secondary | ICD-10-CM | POA: Insufficient documentation

## 2016-11-30 DIAGNOSIS — Z87891 Personal history of nicotine dependence: Secondary | ICD-10-CM

## 2016-11-30 DIAGNOSIS — Z808 Family history of malignant neoplasm of other organs or systems: Secondary | ICD-10-CM | POA: Insufficient documentation

## 2016-11-30 DIAGNOSIS — C786 Secondary malignant neoplasm of retroperitoneum and peritoneum: Secondary | ICD-10-CM | POA: Insufficient documentation

## 2016-11-30 DIAGNOSIS — I2510 Atherosclerotic heart disease of native coronary artery without angina pectoris: Secondary | ICD-10-CM | POA: Diagnosis not present

## 2016-11-30 DIAGNOSIS — D6481 Anemia due to antineoplastic chemotherapy: Secondary | ICD-10-CM | POA: Insufficient documentation

## 2016-11-30 DIAGNOSIS — Z7689 Persons encountering health services in other specified circumstances: Secondary | ICD-10-CM | POA: Diagnosis not present

## 2016-11-30 DIAGNOSIS — Z794 Long term (current) use of insulin: Secondary | ICD-10-CM | POA: Diagnosis not present

## 2016-11-30 DIAGNOSIS — I5032 Chronic diastolic (congestive) heart failure: Secondary | ICD-10-CM | POA: Diagnosis not present

## 2016-11-30 DIAGNOSIS — I10 Essential (primary) hypertension: Secondary | ICD-10-CM | POA: Diagnosis not present

## 2016-11-30 LAB — COMPREHENSIVE METABOLIC PANEL
ALT: 93 U/L — ABNORMAL HIGH (ref 14–54)
AST: 21 U/L (ref 15–41)
Albumin: 3.3 g/dL — ABNORMAL LOW (ref 3.5–5.0)
Alkaline Phosphatase: 282 U/L — ABNORMAL HIGH (ref 38–126)
Anion gap: 8 (ref 5–15)
BUN: 18 mg/dL (ref 6–20)
CO2: 28 mmol/L (ref 22–32)
Calcium: 9.1 mg/dL (ref 8.9–10.3)
Chloride: 96 mmol/L — ABNORMAL LOW (ref 101–111)
Creatinine, Ser: 0.68 mg/dL (ref 0.44–1.00)
GFR calc Af Amer: >60 mL/min (ref >60)
GFR calc non Af Amer: >60 mL/min (ref >60)
Glucose, Bld: 157 mg/dL — ABNORMAL HIGH (ref 65–99)
Potassium: 4.2 mmol/L (ref 3.5–5.1)
Sodium: 132 mmol/L — ABNORMAL LOW (ref 135–145)
Total Bilirubin: 0.5 mg/dL (ref 0.3–1.2)
Total Protein: 7.5 g/dL (ref 6.5–8.1)

## 2016-11-30 LAB — HEPATITIS B SURFACE ANTIGEN

## 2016-11-30 LAB — HEPATITIS B SURFACE AG, CONFIRM: HBsAg Confirmation: POSITIVE — AB

## 2016-11-30 LAB — HEPATITIS C ANTIBODY: HCV Ab: <0.1 s/co ratio (ref 0.0–0.9)

## 2016-11-30 LAB — HEPATITIS B CORE ANTIBODY, TOTAL: Hep B Core Total Ab: NEGATIVE

## 2016-11-30 NOTE — Telephone Encounter (Signed)
Called patient and discussed with patient and husband that Dr. Mike Gip wanted her to see Dr. Vicente Males prior to chemo and that was going to be on Monday with labs and possible Chemo on Tuesday. The rest of this week was on hold and patient and husband voiced understanding, sent to scheduling.  Dr. Georgeann Oppenheim office notified of patients willingness to come on Monday.

## 2016-11-30 NOTE — Progress Notes (Signed)
Patient here today for [place] results.  States she is feeling much better than Monday when she was here.

## 2016-11-30 NOTE — Progress Notes (Signed)
[facility] day:  11/30/2016   Chief Complaint: [name] is a 71 y.o. female with stage IV ovarian cancer and increasing liver function tests who is seen for review of interval imaging.  HPI:  The patient was last seen in the medical oncology clinic on 11/28/2016.  At that time, we reviewed the [facility].  She was PDL-1 positive.  We discussed initiation of pembrolizumab after approval.  She received magnesium.  Liver function tests revealed a dramatic increase with an AST of 111, ALT 218, and alkaline phosphatase of 394.  Bilirubin was 0.7.  Hepatitis serologies were sent.  Abdomen and pelvic CT scan on 11/29/2016 revealed mild interval enlargement of pleural metastasis the RIGHT lower lobe.  There was overall mild decrease in cystic peritoneal metastasis within the abdomen pelvis. The solid lesion at the terminal ileum was decreased in size. Subcapsular lesion in the liver was slightly decreased in size.  There was no evidence of disease progression the abdomen pelvis.  There was mild intrahepatic duct dilatation similar prior.  The ampullary lesion was slightly larger.  There was mild hydronephrosis and hydroureter on the LEFT likely related to cystic metastasis at the LEFT vesicoureteral junction.  Symptomatically, she feels back to baseline.  She denies any abdominal pain or discomfort.   Past Medical History:  Diagnosis Date  . CAD (coronary artery disease)    a. 09/2012 Cath: LM nl, LAD 95p, 52m LCX 967mOM2 50, RCA 100.  . Marland Kitchenholelithiasis   . Chronic diastolic CHF (congestive heart failure) ([facility]  . Collagen vascular disease (HCC)    Rheumatoid Arthritis.  . Esophageal stricture   . Exertional shortness of breath   . GERD (gastroesophageal reflux disease)   . H/O hiatal hernia   . Herniated disc   . History of pancytopenia   . Hyperlipidemia   . Hypertension   . Hypokalemia   . Leukopenia 2012   s/p bone marrow biopsy,  Dr. PaMa Hillock. NSTEMI (non-ST elevated myocardial infarction) ([facility]/2014   "mild" (10/18/2012)  . Ovarian cancer ([facility]   chemo and hysterectomy  . Pneumonia 2013; 08/2012   "one lung; double" (10/18/2012)  . Rheumatoid arthritis(714.0)   . Type I diabetes mellitus ([facility]   "dx'd in 1957" (10/18/2012)    Past Surgical History:  Procedure Laterality Date  . ABDOMINAL HYSTERECTOMY  06/15/2015   Dx L/S, EXLAP TAH BSO omentectomy RSRx colostomy diaphragm resection stripping  . CARDIAC CATHETERIZATION  10/18/2012   "first one was today" (10/18/2012)  . CATARACT EXTRACTION W/ INTRAOCULAR LENS IMPLANT Right 2010  . CHOLECYSTECTOMY  06/15/2015   combined case with ovarian cancer debulking  . COLON SURGERY    . CORONARY ARTERY BYPASS GRAFT N/A 10/19/2012   Procedure: CORONARY ARTERY BYPASS GRAFTING (CABG);  Surgeon: StMelrose NakayamaMD;  [facility] Service: Open [facility] Surgery;  Laterality: N/A;  . ESOPHAGEAL DILATION     "3 or 4 times" (10/19/2011)  . ESOPHAGOGASTRODUODENOSCOPY  2012   Dr. IfMinna Merritts. OSTOMY    . OVARY SURGERY     removal  . PERIPHERAL VASCULAR CATHETERIZATION N/A 03/02/2015   Procedure: PoGlori Luisath Insertion;  Surgeon: JaAlgernon HuxleyMD;  Location: [facility];  Service: Cardiovascular;  Laterality: N/A;  . TUBAL LIGATION  1970    Family History  Problem Relation Age of Onset  . Diabetes Mother   . Arthritis Mother   .  Diabetes Father   . Arthritis Father   . Bone cancer Sister     Social History:  reports that she quit smoking about 22 years ago. Her smoking use included Cigarettes. She has a 30.00 pack-year smoking history. She has never used smokeless tobacco. She reports that she does not drink alcohol or use drugs.  She is accompanied by her husband today.  Allergies:  Allergies  Allergen Reactions  . Codeine Nausea And Vomiting  . Latex Rash    Current Medications: Current Outpatient Prescriptions  Medication Sig Dispense Refill  .  Calcium-Vitamin D 600-200 MG-UNIT tablet Take 1 tablet by mouth 2 (two) times daily.     . cholecalciferol (VITAMIN D) 1000 units tablet Take 1,000 Units by mouth daily.    Marland Kitchen enoxaparin (LOVENOX) 60 MG/0.6ML injection INJECT 0.6 ML (60 MG TOTAL) INTO THE SKIN EVERY 12 HOURS 60 mL 2  . folic acid (FOLVITE) 1 MG tablet Take 1 tablet (1 mg total) by mouth daily. 90 tablet 3  . furosemide (LASIX) 80 MG tablet Take 0.5 tablets by mouth 2 (two) times daily.    . insulin lispro (HUMALOG) 100 UNIT/ML injection Inject 0.06 mLs (6 Units total) into the skin daily. (Patient taking differently: Inject 0-85 Units into the skin daily. Via insulin pump) 30 mL 3  . lidocaine-prilocaine (EMLA) cream APPLY 1 APPLICATION TOPICALLY AS NEEDED 1 HOUR PRIOR TO TREATMENT. COVER WITH PRESS N SEAL UNTIL TREATMENT TIME AS DIRECTED 30 g 1  . loratadine (CLARITIN) 10 MG tablet Take 1 tablet by mouth daily as needed.    . magnesium oxide (MAG-OX) 400 (241.3 Mg) MG tablet TAKE 1 TABLET BY MOUTH ONCE DAILY 30 tablet 1  . metoprolol tartrate (LOPRESSOR) 25 MG tablet Take 1 tablet (25 mg total) by mouth 2 (two) times daily. 180 tablet 3  . ondansetron (ZOFRAN) 8 MG tablet TAKE 1 TABLET BY MOUTH EVERY 8 HOURS AS NEEDED FOR NAUSEA OR VOMITING 30 tablet 1  . pantoprazole (PROTONIX) 40 MG tablet TAKE 1 TABLET BY MOUTH TWICE (2) DAILY 180 tablet 1  . potassium chloride SA (K-DUR,KLOR-CON) 20 MEQ tablet TAKE 1 TABLET BY MOUTH DAILY 30 tablet 2  . predniSONE (DELTASONE) 5 MG tablet Take 5 mg by mouth daily with breakfast.    . vitamin C (ASCORBIC ACID) 500 MG tablet Take 500 mg by mouth daily.    Marland Kitchen zinc gluconate 50 MG tablet Take 50 mg by mouth daily.     Current Facility-Administered Medications  Medication Dose Route Frequency Provider Last Rate Last Dose  . Tbo-Filgrastim (GRANIX) injection 300 mcg  300 mcg Subcutaneous Once Lequita Asal, MD       Facility-Administered Medications Ordered in Other Visits  Medication Dose  Route Frequency Provider Last Rate Last Dose  . sodium chloride 0.9 % injection 10 mL  10 mL Intracatheter PRN Corcoran, Melissa C, MD      . sodium chloride 0.9 % injection 10 mL  10 mL Intravenous PRN Lequita Asal, MD   10 mL at 04/13/15 6433    Review of Systems:  GENERAL:  Feels "better today".  No fever, chills or sweats.  Weight down 4 pounds. PERFORMANCE STATUS (ECOG):  1 HEENT:  No visual changes, sore throat, mouth sores or tenderness. Lungs:  Little shortness of breath.  No cough.  No hemoptysis. Cardiac:  No chest pain, palpitations, orthopnea, or PND.  Took BP pill this AM. GI:  Upper abdominal pain.  No nausea, vomiting,  diarrhea, melena or hematochezia. GU:  No urgency, frequency, dysuria, or hematuria. Musculoskeletal:  Severe rheumatoid arthritis (off MTX and on prednisone).  Osteoporosis.  No back pain. No muscle tenderness. Extremities:  No pain or swelling. Skin:  No increased bruising or bleeding.  No rashes or skin changes. Neuro:  Neuropathy (stable).  No headache, numbness or weakness, balance or coordination issues. Endocrine:  Diabetes on an insulin pump.  Blood sugar elevated.  Occasional hot flashes.  Psych:  No mood changes, depression or anxiety. Pain: No pain. Review of systems:  All other systems reviewed and found to be negative.  Physical Exam:  Blood pressure 133/65, pulse 74, temperature (!) 97.1 F (36.2 C), temperature source Tympanic, resp. rate 18, weight 136 lb 0.4 oz (61.7 kg). GENERAL:  Well developed, well nourished woman sitting comfortably in the exam room in no acute distress. MENTAL STATUS:  Alert and oriented to person, place and time. HEAD:  Alopecia.  Normocephalic, atraumatic, face symmetric, no Cushingoid features. EYES:  Gold rimmed glasses.  Blue eyes.  No conjunctivitis or scleral icterus.  ENT: Oropharynx clear without lesion. Tongue normal. Mucous membranes moist.  RESPIRATORY: Clear to auscultation without rales,  wheezes or rhonchi. CARDIOVASCULAR: Regular rate and rhythm without murmur, rub or gallop. ABDOMEN: Soft, , non-tender with active bowel sounds and no hepatosplenomegaly. No palpable nodularity or masses.  Insulin pump.  Ostomy bag. SKIN: No rashes or ulcers. EXTREMITIES:  No edema, skin discoloration or tenderness. No palpable cords. LYMPH NODES: No palpable cervical, supraclavicular, axillary or inguinal adenopathy  NEUROLOGICAL: Appropriate. PSYCH: Appropriate.  Radiology studies: 02/13/2015:  Abdomen and pelvic CT revealed bilateral mass-like adnexal regions (right adnexal mass 5.6 x 5.0 cm and the left adnexa mass 10.3 x 6.0 x 9.9 cm).  There was a large amount of soft tissue throughout the peritoneal cavity involving the omentum and other peritoneal surfaces.  There was a small volume ascites. There was a peripheral 3.3 x 1.9 cm low-attenuation lesion overlying the right lobe of the liver, likely representing a serosal implant. There was 1.4 x 2.2 cm ill-defined peripheral lesion within the inferior aspect of segment 6 adjacent to the ampulla in the duodenum.  04/28/2015:  Abdominal and pelvic CT revealed decreasing bilateral ovarian masses.   The left adnexal mass measured 4.0 x 6.1 cm (previously 5.0 x 8.5 cm). The right ovary measured 3.8 x 4.9 cm (previously 4.7 x 5.6 cm).  There was improved peritoneal carcinomatosis.  There was a small amount of ascites.  The hepatic dome lesion was stable. The previously seen right hepatic lobe lesion was not well-visualized.  There was a small left pleural effusion and trace right pleural effusion.  The nodular lesion at the ampulla of Vater, extending into the duodenum was stable.  There was no biliary ductal dilatation. 08/01/2015:  Rght upper extremity ultrasound revealed a near occlusive thrombus within the central portion of the right internal jugular vein and central portion of the right subclavian vein. 09/01/2015:  Chest, abdomen, and pelvic  CT revealed continued decrease in perihepatic fluid collection contiguous with the right pleural space with percutaneous drain.  09/11/2015:  Chest, abdomen, and pelvic CT revealed a residual versus recurrent fluid collection posterior to the right hepatic lobe (2.6 x 1.1 x 4.0 cm).   10/13/2015:  Chest, abdomen, and pelvic CT revealed resolution of the empyema. 02/15/2016:  Chest, abdomen, and pelvic CT revealed multiple soft tissue nodules throughout the pelvis, small bowel mesenteric and peritoneum and, concerning for peritoneal metastatic  disease.  There was soft tissue irregularity within the left upper quadrant.  There was mild right hydronephrosis (etiology unclear).  There was interval resolution of previously described right pleural based fluid and gas collection. There was a pleural based nodule within the right lower hemi-thorax concerning for pleural based metastasis.  There were small bilateral pleural effusions.  There was pulmonary nodularity, predominately within the left upper lobe (metastatic or infectious/inflammatory etiology).  There was slightly increased mediastinal adenopathy (infectious/inflammatory or metastatic).  There was a low attenuation lesion within the left hepatic lobe (complicated fluid within the fissure or metastatic disease). 03/08/2016:  Abdomen and pelvic CT revealed mild progression of peritoneal disease in the abdomen.  There was interval increase in loculated fluid around the lateral segment left liver, stomach, and spleen.  There was persistent soft tissue lesion at the level of the ampulla with mild intra and extrahepatic biliary duct dilatation.  There was persistent intrahepatic and capsular metastatic disease involving the liver. 05/26/2016:  Head MRI revealed multiple small areas of acute infarct involving the occipital parietal lobe bilaterally and the left lateral cerebellum,  consistent with posterior circulation emboli.  06/10/2016:  Adomen and pelvic CT  revealed progression of peritoneal carcinomatosis predominantly in the perihepatic space, bilateral lower quadrants and pelvis.  There was worsening bilateral obstructive uropathy due to malignant involvement of the pelvic ureters.  There was stable small pleural/subpleural nodules at the right lung base.  There was stable small left pleural effusion.  There was decreased small volume perihepatic ascites.  08/09/2016:  Abdomen and pelvic CT revealed interval increase and loculated fluid collections within the peritoneal space consistent with progression of peritoneal ovarian carcinoma metastasis.  There was one large 7 cm collection in the RIGHT lower quadrant which had increased significantly in size and may represent of point of small bowel obstruction.  The proximal stomach and duodenum were decompressed. The mid small bowel was mildly dilated. There was concern for obstruction given the large intraperitoneal fluid collection.  There was increase in subcapsular hepatic metastatic fluid collections.  There was enlargement of rounded ampullary lesion in the second portion duodenum concerning for metastatic lesion.  There was mild biliary duct dilatation (stable).  There was nodular pleural metastasis in the RIGHT lower lobe pleural space. 11/03/2016:  Abdomen and pelvic CT revealed multifocal cystic metastases in the abdomen/pelvis, reflecting peritoneal disease.  The dominant lesion in the left anterior abdomen has progressed, while additional lesions were mixed.  Peritoneal disease along the liver and spleen progressed.  Pleural-based metastases along the posterior right hemithorax progressed. There were trace left pleural effusion.  There was a stable 1.7 cm duodenal lesion at the ampulla. 11/29/2016:  Abdomen and pelvic CT revealed mild interval enlargement of pleural metastasis the RIGHT lower lobe.  There was overall mild decrease in cystic peritoneal metastasis within the abdomen pelvis. The solid lesion  at the terminal ileum was decreased in size. Subcapsular lesion in the liver was slightly decreased in size.  There was no evidence of disease progression the abdomen pelvis.  There was mild intrahepatic duct dilatation similar prior.  The ampullary lesion was slightly larger.  There was mild hydronephrosis and hydroureter on the LEFT likely related to cystic metastasis at the LEFT vesicoureteral junction  Admissions: [place] from 06/15/2015 - 06/22/2015.  She underwent exploratory laparotomy, lysis of adhesions, total abdominal hysterectomy with bilateral salpingo-oophorectomy, infracolic omentectomy, optimal tumor debulking(< 1 cm), recto-sigmoid resection with creation of end colostomy, cholecystectomy, mobilization of splenic flexure and liver  with diaphragmatic stripping on 06/15/2015. The right diaphragm was cleared of tumor. During dissection, the diaphragm was entered and closed with sutures.  [place] from 07/01/2015 - 07/03/2015 with pleural effusion. [place] from 07/07/2015 - 07/09/2015 with recurrent pleural effusion. [place] from 07/29/2015 - 08/10/2015 with a right-sided empyema and liver abscess. She underwent CT-guided placement of a liver abscess drain on 07/30/2015.  Liver abscess culture grew out group B strep and Enterobacter which was sensitive to Zosyn. She was transitioned to ertapenem Colbert Ewing) prior to discharge.  She was readmitted to [facility] on 09/17/2015. 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  Head MRI on 05/26/2016 revealed multiple small areas of acute infarct involving the occipital parietal lobe bilaterally and the left lateral cerebellum,  consistent with posterior circulation emboli.  Work-up included a negative carotid ultrasound and echocardiogram. [facility] from 08/10/2016 - 08/14/2016 with a small bowel obstruction.  She was managed conservatively. [place] from 09/27/2016 - 09/28/2016 with symptomatic anemia and diarrhea.   Infusion on  11/29/2016  Component Date Value Ref Range Status  . Hep B Core Total Ab 11/29/2016 Negative  Negative Final   Comment: (NOTE) Performed At: [facility] [place], [place] [id number] Lindon Romp MD PY:[id number]   . HCV Ab 11/29/2016 <0.1  0.0 - 0.9 s/co ratio Final   Comment: (NOTE)                                  Negative:     < 0.8                             Indeterminate: 0.8 - 0.9                                  Positive:     > 0.9 The CDC recommends that a positive HCV antibody result be followed up with a HCV Nucleic Acid Amplification test ([id number]). Performed At: [facility] [place], [place] [id number] Lindon Romp MD IO:[id number]   . Creatinine, Ser 11/29/2016 0.63  0.44 - 1.00 mg/dL Final  . GFR calc non Af Amer 11/29/2016 >60  >60 mL/min Final  . GFR calc Af Amer 11/29/2016 >60  >60 mL/min Final   Comment: (NOTE) The eGFR has been calculated using the CKD EPI equation. This calculation has not been validated in all clinical situations. eGFR's persistently <60 mL/min signify possible Chronic Kidney Disease.   Infusion on 11/28/2016  Component Date Value Ref Range Status  . Magnesium 11/28/2016 1.4* 1.7 - 2.4 mg/dL Final  . WBC 11/28/2016 4.2  3.6 - 11.0 K/uL Final  . RBC 11/28/2016 3.26* 3.80 - 5.20 MIL/uL Final  . Hemoglobin 11/28/2016 10.0* 12.0 - 16.0 g/dL Final  . HCT 11/28/2016 28.9* 35.0 - 47.0 % Final  . MCV 11/28/2016 88.8  80.0 - 100.0 fL Final  . MCH 11/28/2016 30.7  26.0 - 34.0 pg Final  . MCHC 11/28/2016 34.6  32.0 - 36.0 g/dL Final  . RDW 11/28/2016 17.3* 11.5 - 14.5 % Final  . Platelets 11/28/2016 233  150 - 440 K/uL Final  . Neutrophils Relative % 11/28/2016 72  % Final  . Neutro Abs 11/28/2016 3.0  1.4 - 6.5 K/uL Final  . Lymphocytes Relative 11/28/2016 12  %  Final  . Lymphs Abs 11/28/2016 0.5* 1.0 - 3.6 K/uL Final  . Monocytes Relative 11/28/2016 13  % Final  . Monocytes Absolute  11/28/2016 0.5  0.2 - 0.9 K/uL Final  . Eosinophils Relative 11/28/2016 2  % Final  . Eosinophils Absolute 11/28/2016 0.1  0 - 0.7 K/uL Final  . Basophils Relative 11/28/2016 1  % Final  . Basophils Absolute 11/28/2016 0.1  0 - 0.1 K/uL Final  . Sodium 11/28/2016 132* 135 - 145 mmol/L Final  . Potassium 11/28/2016 4.0  3.5 - 5.1 mmol/L Final  . Chloride 11/28/2016 94* 101 - 111 mmol/L Final  . CO2 11/28/2016 28  22 - 32 mmol/L Final  . Glucose, Bld 11/28/2016 308* 65 - 99 mg/dL Final  . BUN 11/28/2016 22* 6 - 20 mg/dL Final  . Creatinine, Ser 11/28/2016 0.92  0.44 - 1.00 mg/dL Final  . Calcium 11/28/2016 9.3  8.9 - 10.3 mg/dL Final  . GFR calc non Af Amer 11/28/2016 >60  >60 mL/min Final  . GFR calc Af Amer 11/28/2016 >60  >60 mL/min Final   Comment: (NOTE) The eGFR has been calculated using the CKD EPI equation. This calculation has not been validated in all clinical situations. eGFR's persistently <60 mL/min signify possible Chronic Kidney Disease.   . Anion gap 11/28/2016 10  5 - 15 Final  Appointment on 11/28/2016  Component Date Value Ref Range Status  . Total Protein 11/28/2016 7.5  6.5 - 8.1 g/dL Final  . Albumin 11/28/2016 3.2* 3.5 - 5.0 g/dL Final  . AST 11/28/2016 111* 15 - 41 U/L Final  . ALT 11/28/2016 218* 14 - 54 U/L Final  . Alkaline Phosphatase 11/28/2016 394* 38 - 126 U/L Final  . Total Bilirubin 11/28/2016 0.7  0.3 - 1.2 mg/dL Final  . Bilirubin, Direct 11/28/2016 0.2  0.1 - 0.5 mg/dL Final  . Indirect Bilirubin 11/28/2016 0.5  0.3 - 0.9 mg/dL Final  . TSH 11/28/2016 2.372  0.350 - 4.500 uIU/mL Final   Performed by a 3rd Generation assay with a functional sensitivity of <=0.01 uIU/mL.    Assessment:  [name] is a 71 y.o. female with progressive stage IV ovarian cancer.  She presented with abdominal discomfort and bloating.  Omental biopsy on 02/23/2015 revealed metastatic high grade serous carcinoma, consistent with gynecologic origin.   She was initially  diagnosed with clinical stage IIIC (T3cN1Mx).  CA125 was 707 on 02/17/2015.  She received 4 cycles of neoadjuvant carboplatin and Taxol (03/05/2015 - 05/22/2015).  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  She had loose stools on oral magnesium.  She was initially on Neurontin then switched Lyrica with cycle #3.  Cycle #4 was notable for neutropenia (ANC 300) requiring GCSF x 3 days.    She underwent exploratory laparotomy, lysis of adhesions, total abdominal hysterectomy with bilateral salpingo-oophorectomy, infracolic omentectomy, optimal tumor debulking(< 1 cm), recto-sigmoid resection with creation of end colostomy, cholecystectomy, mobilization of splenic flexure and liver with diaphragmatic stripping on 06/15/2015. The right diaphragm was cleared of tumor. During dissection, the diaphragm was entered and closed with sutures.  She received tamoxifen from 02/25/2016 - 03/14/2016.  She received 4 cycles of carboplatin and gemcitabine (03/21/2016 - 05/24/2016) with GCSF/Neulasta support.  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  She received 2 cycles of Doxil (06/21/2016 - 07/19/2016) with Neulasta support.  She received 4 cycles of topotecan with Neulasta support (08/22/2016 - 10/10/2016).  Abdomen and pelvic CT on 11/29/2016  revealed mild interval enlargement of pleural metastasis the RIGHT lower lobe.  There was overall mild decrease in cystic peritoneal metastasis within the abdomen pelvis. The solid lesion at the terminal ileum was decreased in size. Subcapsular lesion in the liver was slightly decreased in size.  There was no evidence of disease progression the abdomen pelvis.  There was mild intrahepatic duct dilatation similar prior.  The ampullary lesion was slightly larger.  There was mild hydronephrosis and hydroureter on the LEFT likely related to cystic metastasis at the LEFT vesicoureteral junction  CA125 was 802.9 on 03/30/2015, 567.9 on 04/13/2015, 168.8 on  05/15/2015, 85.2 on 07/17/2015, 68.6 on 07/28/2015, 34.5 on 09/17/2015, 20.7 on 11/06/2015, 49 on 01/22/2016, 106.1 on 02/22/2016, 138.8 on 03/07/2016, 220.8 on 03/21/2016, 152.8 on 04/11/2016, 125.6 on 04/18/2016, 76.8 on 05/03/2016, 60.2 on 05/24/2016, 44 on 07/19/2016, 65.8 on 08/17/2016, 53.4 on 09/12/2016, and 47.3 on 10/10/2016.  PDL-1 testing revealed a combined score was 5 (>=1 positive).     She has a history of recurrent right sided pleural effusion.  She underwent thoracentesis of 650 cc on post-operative day 3.  She was admitted to [facility] on 07/01/2015 and 07/07/2015 for recurrent shortness of breath.  She underwent 2 additional thoracenteses (1.1 L on 07/02/2015 and 850 cc on 07/08/2015).  Cytology was negative x 2.  Bilateral lower extremity duplex on 07/03/2015 was negative.  Echo revealed an EF of 55-60% on 07/08/2015 and 60-65% on 05/27/2016.    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 She was on Lovenox 60 mg twice a day.  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  She has severe rheumatoid arthritis.  Methotrexate and Enbrel were initially on hold.  She has a normocytic anemia.  Work-up on 02/17/2015 and 05/24/2016 revealed a normal ferritin, B12, folate, TSH.  She denies any melena or hematochezia.    She has anemia due to chronic disease. She received 1 unit PRBCs during her admission at [facility]. She denies any melena or hematochezia. She has diabetes and is on an insulin pump.  She has had persistent neutropenia felt secondary to her rheumatoid arthritis.  Folate and MMA were normal.  TSH was 6.13 (high) with a free T4 of 1.17 (0.61-1.12).  She began methotrexate (10 mg a week) and prednisone (5 mg a day) for severe rheumatoid arthritis on 12/17/2015.  She remains on prednisone alone (5 mg a  day).  Bone marrow aspirate and biopsy on 06/09/2010 revealed a hypercellular marrow (70%) with no evidence of dysplasia or malignancy.  Flow cytometry was negative.  Cytogenetics were normal (46,XX).  FISH studies were negative for MDS.  Bone marrow aspirate and biopsy on 12/03/2015 revealed a normocellular to mildy hypercellular marrow for age (40%) with left shifted myelopoiesis, non specific dyserythropoiesis and mild megakaryocytic atypia with no increase in blasts.  There were multiple small nonspecific lymphoid aggregates (favor reactive). There was no increase in reticulin.  There was decreased myeloid cells (37%) with left shifted maturation and 1% atypical myelod blasts.  There was relatively increased monocytic cells (11%), relatively increased lymphoid cells (36%), and relatively increased eosinophils (6%).  Cytogenetics were normal (98, XX).  SNP microarray was normal.  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  She receives IV magnesium weekly.  She has chemotherapy induced anemia.  She has received Procrit (last 11/07/2016).  Labs on 08/22/2016 revealed a normal B12 and folate.  Ferritin was 37, iron saturation 11% and TIBC 281.  Code status is DNR/DNI.  She developed acute LFT elevation on 11/28/2016.  Event was preceded by upper abdominal discomfort.  Hepatitis B core antibody total and hepatitis C antibody were negative.  Symptomatically, upper abdominal discomfort has resolved.  Exam is stable.  She has chemotherapy induced anemia.  She has chronic hypomagnesemia.  LFTs have improved.  Plan: 1.  Review interval scans.  Report provided patient.  Images reviewed.  There was no evidence of progression in the abdomen/pelvis.  Ductal dilatation was similar.  There was enlargement in the RLL pleural metastasis.  The etiology of the increased liver function tests is unclear.  Discuss possibility of passage of a stone as LFTs were drawn after acute RUQ symptoms the weekend  before.  Currently she feels fine and is back to baseline.  Discuss repeating LFTs today and referral to GI.  Discuss importance of clarifying prior to initiation of pembrolizumab given potential side effects of therapy. 2.  Discuss hepatitis B and C serologies.  Testing negative.  Await hepatitis B surface antigen. 3.  Phone follow-up with GI (Dr Allen Norris or Dr Vicente Males) today 4.  Labs today:  CMP. 5.  Tentative treatment tomorrow in [place].  RN to call patient if postponed. 6.  RTC on 12/15/2016 (2 week after initial treatment) for MD assess, labs (CBC with diff, CMP, Mg), and cycle #2 pembrolizumab.  Addendum:  Patient called with improvement in LFTs (alk phos 394 to 282; AST 111 to 21; ALT 218 to 93; bilirubin normal 0.7 to 0.5).  Discussed with Dr. Vicente Males.  Patient will be seen by Dr. Vicente Males on Monday, 12/05/2016.  Labs will be repeated that day.  If continued improvement, anticipate treatment on Tuesday, 12/06/2016).   Lequita Asal, MD  11/30/2016, 10:54 AM

## 2016-11-30 NOTE — Telephone Encounter (Signed)
Spoke with Dr. Mike Gip regarding Keytruda dose of 10mg /kg. This dosing is found in [place] study per Dr. Mike Gip.

## 2016-12-01 ENCOUNTER — Inpatient Hospital Stay: Payer: Medicare Other

## 2016-12-01 LAB — GAMMA GT: GGT: 87 U/L — ABNORMAL HIGH (ref 7–50)

## 2016-12-05 ENCOUNTER — Telehealth: Payer: Self-pay

## 2016-12-05 ENCOUNTER — Ambulatory Visit: Payer: Medicare Other

## 2016-12-05 ENCOUNTER — Other Ambulatory Visit: Payer: Self-pay

## 2016-12-05 ENCOUNTER — Other Ambulatory Visit: Payer: Self-pay | Admitting: *Deleted

## 2016-12-05 ENCOUNTER — Ambulatory Visit: Payer: Medicare Other | Admitting: Hematology and Oncology

## 2016-12-05 ENCOUNTER — Other Ambulatory Visit: Payer: Medicare Other

## 2016-12-05 ENCOUNTER — Ambulatory Visit (INDEPENDENT_AMBULATORY_CARE_PROVIDER_SITE_OTHER): Payer: Medicare Other | Admitting: Gastroenterology

## 2016-12-05 ENCOUNTER — Inpatient Hospital Stay: Payer: Medicare Other

## 2016-12-05 ENCOUNTER — Encounter: Payer: Self-pay | Admitting: Gastroenterology

## 2016-12-05 VITALS — BP 125/68 | HR 74 | Temp 97.8°F | Ht 63.0 in | Wt 137.0 lb

## 2016-12-05 DIAGNOSIS — R768 Other specified abnormal immunological findings in serum: Secondary | ICD-10-CM

## 2016-12-05 DIAGNOSIS — C786 Secondary malignant neoplasm of retroperitoneum and peritoneum: Secondary | ICD-10-CM

## 2016-12-05 DIAGNOSIS — C569 Malignant neoplasm of unspecified ovary: Secondary | ICD-10-CM

## 2016-12-05 DIAGNOSIS — R945 Abnormal results of liver function studies: Secondary | ICD-10-CM

## 2016-12-05 DIAGNOSIS — K7689 Other specified diseases of liver: Secondary | ICD-10-CM | POA: Diagnosis not present

## 2016-12-05 DIAGNOSIS — C800 Disseminated malignant neoplasm, unspecified: Secondary | ICD-10-CM

## 2016-12-05 DIAGNOSIS — C801 Malignant (primary) neoplasm, unspecified: Secondary | ICD-10-CM

## 2016-12-05 DIAGNOSIS — R7989 Other specified abnormal findings of blood chemistry: Secondary | ICD-10-CM

## 2016-12-05 DIAGNOSIS — C561 Malignant neoplasm of right ovary: Secondary | ICD-10-CM

## 2016-12-05 LAB — CBC WITH DIFFERENTIAL/PLATELET
Basophils Absolute: 0 10*3/uL (ref 0–0.1)
Basophils Relative: 1 %
Eosinophils Absolute: 0.1 10*3/uL (ref 0–0.7)
Eosinophils Relative: 3 %
HCT: 28.3 % — ABNORMAL LOW (ref 35.0–47.0)
Hemoglobin: 9.8 g/dL — ABNORMAL LOW (ref 12.0–16.0)
Lymphocytes Relative: 17 %
Lymphs Abs: 0.5 10*3/uL — ABNORMAL LOW (ref 1.0–3.6)
MCH: 30.3 pg (ref 26.0–34.0)
MCHC: 34.6 g/dL (ref 32.0–36.0)
MCV: 87.7 fL (ref 80.0–100.0)
Monocytes Absolute: 0.4 10*3/uL (ref 0.2–0.9)
Monocytes Relative: 12 %
Neutro Abs: 2.1 10*3/uL (ref 1.4–6.5)
Neutrophils Relative %: 67 %
Platelets: 258 10*3/uL (ref 150–440)
RBC: 3.23 MIL/uL — ABNORMAL LOW (ref 3.80–5.20)
RDW: 16.6 % — ABNORMAL HIGH (ref 11.5–14.5)
WBC: 3.1 10*3/uL — ABNORMAL LOW (ref 3.6–11.0)

## 2016-12-05 LAB — COMPREHENSIVE METABOLIC PANEL
ALT: 22 U/L (ref 14–54)
AST: 13 U/L — ABNORMAL LOW (ref 15–41)
Albumin: 3.2 g/dL — ABNORMAL LOW (ref 3.5–5.0)
Alkaline Phosphatase: 152 U/L — ABNORMAL HIGH (ref 38–126)
Anion gap: 7 (ref 5–15)
BUN: 20 mg/dL (ref 6–20)
CO2: 29 mmol/L (ref 22–32)
Calcium: 9.2 mg/dL (ref 8.9–10.3)
Chloride: 98 mmol/L — ABNORMAL LOW (ref 101–111)
Creatinine, Ser: 0.73 mg/dL (ref 0.44–1.00)
GFR calc Af Amer: >60 mL/min (ref >60)
GFR calc non Af Amer: >60 mL/min (ref >60)
Glucose, Bld: 126 mg/dL — ABNORMAL HIGH (ref 65–99)
Potassium: 4.4 mmol/L (ref 3.5–5.1)
Sodium: 134 mmol/L — ABNORMAL LOW (ref 135–145)
Total Bilirubin: 0.3 mg/dL (ref 0.3–1.2)
Total Protein: 7.1 g/dL (ref 6.5–8.1)

## 2016-12-05 LAB — MAGNESIUM: Magnesium: 1.4 mg/dL — ABNORMAL LOW (ref 1.7–2.4)

## 2016-12-05 NOTE — Telephone Encounter (Signed)
LVM for patient (permission granted) MRCP scheduled for Friday, 8/10 @ 9am @ [place] N[facility] 4 hours.

## 2016-12-05 NOTE — Progress Notes (Signed)
Janet Bellows MD, MRCP(U.K) [address]  [place]  [place], [place] [address]  Main: [phone]  Fax: [phone]   Gastroenterology Consultation  Referring Provider:    Dr [name] Primary Care Physician:  Janet Haven, MD Primary Gastroenterologist:  Dr. Jonathon [name]  Reason for Consultation:     Abnormal LFT's        HPI:   [name] is a 71 y.o. y/o female has been referred by Dr [name] for evaluation of abnormal LFT's. 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 She has undergone cholecystectomy in 06/2015 along with debulking surgery . Ct scan on 11/03/16 shows abdominal metastasus, involvement of the liver and spleen as well. There is also a 1.7 cm lesion at the ampulla of the duodenum. She was found to have a new rise in her LFT's . Alkaline phosphotase has been elevated mildly previously. On 11/28/16 there was a rise in ALTY,AST and alkaline phosphotase with a normal bilirubin , 2 days later there was an improvement . Hbsag, Hep B c ab, Hep C ab -negative  Hepatitis B surface antigen is positive   Ct scan on 11/29/16 shows subcapsular cystic metastasis , mild intraductal hepatic dilation . Lesion of ampulla has increased in size from 17 to 20 mm , no pancreatic ductal dilation.   Denies any new symptoms . Says she has been tested for hepatitis in the past and was negative. Denies any tatoos. Denies illegal drug use. No prior blood transfusion. Denies working in [employer]. Does recall .  She recalls a week back had some abdominal pain , lasted all day , all over the upper abdomen. No similar pains in the past . It subsided all of a sudden .    CMP Latest Ref Rng & Units 11/30/2016 11/29/2016 11/28/2016  Glucose 65 - 99 mg/dL 157(H) - 308(H)  BUN 6 - 20 mg/dL 18 - 22(H)  Creatinine 0.44 - 1.00 mg/dL 0.68 0.63 0.92  Sodium 135 - 145 mmol/L 132(L) - 132(L)  Potassium 3.5 - 5.1 mmol/L 4.2 - 4.0  Chloride 101 - 111 mmol/L 96(L) - 94(L)  CO2  22 - 32 mmol/L 28 - 28  Calcium 8.9 - 10.3 mg/dL 9.1 - 9.3  Total Protein 6.5 - 8.1 g/dL 7.5 - 7.5  Total Bilirubin 0.3 - 1.2 mg/dL 0.5 - 0.7  Alkaline Phos 38 - 126 U/L 282(H) - 394(H)  AST 15 - 41 U/L 21 - 111(H)  ALT 14 - 54 U/L 93(H) - 218(H)     Past Medical History:  Diagnosis Date  . CAD (coronary artery disease)    a. 09/2012 Cath: LM nl, LAD 95p, 57m LCX 975mOM2 50, RCA 100.  . Marland Kitchenholelithiasis   . Chronic diastolic CHF (congestive heart failure) ([facility] [place]  . Collagen vascular disease (H[facility])    Rheumatoid Arthritis.  . Esophageal stricture   . Exertional shortness of breath   . GERD (gastroesophageal reflux disease)   . H/O hiatal hernia   . Herniated disc   . History of pancytopenia   . Hyperlipidemia   . Hypertension   . Hypokalemia   . Leukopenia 2012   s/p bone marrow biopsy, Dr. PaMa Hillock. NSTEMI (non-ST elevated myocardial infarction) ([facility]   "mild" (10/18/2012)  . Ovarian cancer ([facility]   chemo and hysterectomy  . Pneumonia 2013; 08/2012   "one lung; double" (10/18/2012)  . Rheumatoid arthritis(714.0)   . Type I diabetes  mellitus ([place])    "dx'd in 1957" (10/18/2012)    Past Surgical History:  Procedure Laterality Date  . ABDOMINAL HYSTERECTOMY  06/15/2015   Dx L/S, EXLAP TAH BSO omentectomy RSRx colostomy diaphragm resection stripping  . CARDIAC CATHETERIZATION  10/18/2012   "first one was today" (10/18/2012)  . CATARACT EXTRACTION W/ INTRAOCULAR LENS IMPLANT Right 2010  . CHOLECYSTECTOMY  06/15/2015   combined case with ovarian cancer debulking  . COLON SURGERY    . CORONARY ARTERY BYPASS GRAFT N/A 10/19/2012   Procedure: CORONARY ARTERY BYPASS GRAFTING (CABG);  Surgeon: Melrose Nakayama, MD;  Location: [facility];  Service: Open Heart Surgery;  Laterality: N/A;  . ESOPHAGEAL DILATION     "3 or 4 times" (10/19/2011)  . ESOPHAGOGASTRODUODENOSCOPY  2012   Dr. Minna Merritts  . OSTOMY    . OVARY SURGERY     removal  . PERIPHERAL VASCULAR CATHETERIZATION N/A  03/02/2015   Procedure: Glori Luis Cath Insertion;  Surgeon: Algernon Huxley, MD;  Location: [facility];  Service: Cardiovascular;  Laterality: N/A;  . [place]    Prior to Admission medications   Medication Sig Start Date End Date Taking? Authorizing Provider  Calcium-Vitamin D 600-200 MG-UNIT tablet Take 1 tablet by mouth 2 (two) times daily.    Yes [provider]  cholecalciferol (VITAMIN D) 1000 units tablet Take 1,000 Units by mouth daily.   Yes [provider]  enoxaparin (LOVENOX) 60 MG/0.6ML injection INJECT 0.6 ML (60 MG TOTAL) INTO THE SKIN EVERY 12 HOURS 11/15/16  Yes Corcoran, Drue Second, MD  folic acid (FOLVITE) 1 MG tablet Take 1 tablet (1 mg total) by mouth daily. 09/10/16  Yes Janet Haven, MD  furosemide (LASIX) 80 MG tablet Take 0.5 tablets by mouth 2 (two) times daily. 07/22/16  Yes [provider]  insulin lispro (HUMALOG) 100 UNIT/ML injection Inject 0.06 mLs (6 Units total) into the skin daily. Patient taking differently: Inject 0-85 Units into the skin daily. Via insulin pump 11/20/15  Yes Jackolyn Confer, MD  lidocaine-prilocaine (EMLA) cream APPLY 1 APPLICATION TOPICALLY AS NEEDED 1 HOUR PRIOR TO TREATMENT. COVER WITH PRESS N SEAL UNTIL TREATMENT TIME AS DIRECTED 06/15/16  Yes Corcoran, Melissa C, MD  loratadine (CLARITIN) 10 MG tablet Take 1 tablet by mouth daily as needed.   Yes [provider]  magnesium oxide (MAG-OX) 400 (241.3 Mg) MG tablet TAKE 1 TABLET BY MOUTH ONCE DAILY 09/06/16  Yes Corcoran, Drue Second, MD  metoprolol tartrate (LOPRESSOR) 25 MG tablet Take 1 tablet (25 mg total) by mouth 2 (two) times daily. 04/12/16  Yes Gollan, Kathlene November, MD  ondansetron (ZOFRAN) 8 MG tablet TAKE 1 TABLET BY MOUTH EVERY 8 HOURS AS NEEDED FOR NAUSEA OR VOMITING 03/23/16  Yes Corcoran, Melissa C, MD  pantoprazole (PROTONIX) 40 MG tablet TAKE 1 TABLET BY MOUTH TWICE (2) DAILY 11/04/16  Yes Janet Haven, MD  potassium  chloride SA (K-DUR,KLOR-CON) 20 MEQ tablet TAKE 1 TABLET BY MOUTH DAILY 10/31/16  Yes Lloyd Huger, MD  predniSONE (DELTASONE) 5 MG tablet Take 5 mg by mouth daily with breakfast.   Yes [provider]  vitamin C (ASCORBIC ACID) 500 MG tablet Take 500 mg by mouth daily.   Yes [provider]  zinc gluconate 50 MG tablet Take 50 mg by mouth daily.   Yes [provider]    Family History  Problem Relation Age of Onset  . Diabetes Mother   .  Arthritis Mother   . Diabetes Father   . Arthritis Father   . Bone cancer Sister      Social History  Substance Use Topics  . Smoking status: Former Smoker    Packs/day: 1.00    Years: 30.00    Types: Cigarettes    Quit date: 06/11/1994  . Smokeless tobacco: Never Used  . Alcohol use No    Allergies as of 12/05/2016 - Review Complete 12/05/2016  Allergen Reaction Noted  . Codeine Nausea And Vomiting 01/10/2011  . Latex Rash 11/17/2015    Review of Systems:    All systems reviewed and negative except where noted in HPI.   Physical Exam:  BP 125/68 (BP Location: Left Arm, Patient Position: Sitting, Cuff Size: Normal)   Pulse 74   Temp 97.8 F (36.6 C) (Oral)   Ht 5' 3"  (1.6 m)   Wt 137 lb (62.1 kg)   BMI 24.27 kg/m  No LMP recorded. Patient has had a hysterectomy. Psych:  Alert and cooperative. Normal mood and affect. General:   Alert,  Well-developed, well-nourished, pleasant and cooperative in NAD Head:  Normocephalic and atraumatic. Eyes:  Sclera clear, no icterus.   Conjunctiva pink. Ears:  Normal auditory acuity. Nose:  No deformity, discharge, or lesions. Mouth:  No deformity or lesions,oropharynx pink & moist. Neck:  Supple; no masses or thyromegaly. Lungs:  Respirations even and unlabored.  Clear throughout to auscultation.   No wheezes, crackles, or rhonchi. No acute distress. Heart:  Regular rate and rhythm; no murmurs, clicks, rubs, or gallops. Abdomen: Central verticle abdominal scar,  LLQ colostomy bag, insulin pump, No abdominal distension.No guarding or rebound tenderness.    Pulses:  Normal pulses noted. Extremities:  No clubbing or edema.  No cyanosis. Neurologic:  Alert and oriented x3;  grossly normal neurologically. Skin:  Intact without significant lesions or rashes. No jaundice. Psych:  Alert and cooperative. Normal mood and affect.  Imaging Studies: Ct Abdomen Pelvis W Contrast  Result Date: 11/29/2016 CLINICAL DATA:  Metastatic ovarian carcinoma. RIGHT upper quadrant pain. EXAM: CT ABDOMEN AND PELVIS WITH CONTRAST TECHNIQUE: Multidetector CT imaging of the abdomen and pelvis was performed using the standard protocol following bolus administration of intravenous contrast. CONTRAST:  155m ISOVUE-300 IOPAMIDOL (ISOVUE-300) INJECTION 61% COMPARISON:  CT 11/03/2016 FINDINGS: Lower chest: Nodule pleural metastasis the RIGHT lower lobe anteriorly is slightly increased measuring 20 mm (image 12, series 2) compared to 14 mm. Hepatobiliary: Subcapsular cystic metastatic lesion the RIGHT hepatic lobe measures 7.4 x 3.6 cm decreased from 7.6 by 3.7 cm. Other subcapsular lesions are similar. Thickening along the falciform ligament measures 2.8 cm compared with 3.1 cm. Mild intrahepatic duct dilatation. Postcholecystectomy. Pancreas: Pancreas is normal. Lesion at the ampulla measuring 20 mm compares with 17 mm (image 42, series 2). No pancreatic duct dilatation per Spleen: Normal spleen Adrenals/urinary tract: Adrenal glands normal. Mild hydronephrosis of the LEFT kidney and LEFT hydroureter similar comparison exam. Bladder is normal. cystic peritoneal metastasis along the LEFT vesicoureteral junction similar to prior. Stomach/Bowel: Stomach small-bowel are normal without evidence obstruction. There is a mass adjacent to terminal ileum measuring 4.1 cm (image 54, series 2) decreased from 5.0 cm. Moderate volume stool. There is a LEFT lower quadrant ostomy. The duodenum lesion described  the pancreas section. Vascular/Lymphatic: Abdominal aortic normal caliber. Calcification noted. No lymphadenopathy Reproductive: Post hysterectomy and oophorectomy Other: Multiple round cystic peritoneal metastasis and several solid peritoneal metastasis are again noted. Lesions have decreased mildly in size. LEFT upper quadrant  lesion measuring 6.0 x 5.4 cm compares with 6.3 x 5.9 cm. LEFT lower quadrant lesion measuring 5.6 x 3.8 cm compares to 5.9 x 4.0 cm. Musculoskeletal: No aggressive osseous lesion. IMPRESSION: 1. Mild interval enlargement of pleural metastasis the RIGHT lower lobe. 2. Overall mild decrease in cystic peritoneal metastasis within the abdomen pelvis. Solid lesion at the terminal ileum is decreased in size. Subcapsular lesion in the liver is slightly decreased in size. 3. No evidence of disease progression the abdomen pelvis. 4. Mild intrahepatic duct dilatation similar prior. 5. Ampullary lesion is slightly larger. 6. Mild hydronephrosis and hydroureter on the LEFT likely related to cystic metastasis at the LEFT vesicoureteral junction. Similar findings to prior. Electronically Signed   By: Suzy Bouchard M.D.   On: 11/29/2016 12:31    Assessment and Plan:   [name] is a 71 y.o. y/o female has been referred for  evaluation by Dr [name] for abnormal LFT's in setting of Stage IV ovarian cancer. She also has intrahepatic biliary dilation , lesion at the duodenal ampulla. She also gives  ahistory of upper abdominal pain the day prior she went to see DR [name] .  New finding of Hepatitis B surface antigen positive. Dr [name] was planning to start her on Pembrolizumab which can cause autoimmune hepatitis.    My impression:   Abnormal LFT's likely either from passage of a stone (prior cholecystectomy) vs ampullary lesion causing biliary obstruction although T bilirubin has been negative. Abnormal LFT can also be from Acute hepatitis B. If the LFT's have resolved today when  rechecked would more go in favor of passing a stone, if still elevated could be from the other two reasons.    Plan  1. Check Hep B surface antigen , Core antibody total, IGM,Delta antigen , Viral load , E antigen and antibody ,HIV 2. Autoimmune hepatitis screen  3. MRI/MRCP to evaluate ampullary lesions and r/u intrahepatic biliary obstruction 4. IF she does have hepatitis B - I would suggest to start her on Tenofovir 300 mg once daily as it would be hard to determine once she starts Pembrolizumab if a rise in LFT's is from the drug related side effects or from effects of hepatitis B.  I am on vacation next week , if the results are not back in time, will have her see Dr Allen Norris to explain the results and next steps.    Dr Janet Bellows MD,MRCP(U.K)

## 2016-12-06 ENCOUNTER — Inpatient Hospital Stay: Payer: Medicare Other | Admitting: Hematology and Oncology

## 2016-12-06 ENCOUNTER — Other Ambulatory Visit: Payer: Medicare Other

## 2016-12-06 ENCOUNTER — Inpatient Hospital Stay: Payer: Medicare Other

## 2016-12-06 ENCOUNTER — Telehealth: Payer: Self-pay | Admitting: Pharmacist

## 2016-12-06 ENCOUNTER — Telehealth: Payer: Self-pay

## 2016-12-06 ENCOUNTER — Other Ambulatory Visit: Payer: Self-pay | Admitting: *Deleted

## 2016-12-06 DIAGNOSIS — C801 Malignant (primary) neoplasm, unspecified: Principal | ICD-10-CM

## 2016-12-06 DIAGNOSIS — C786 Secondary malignant neoplasm of retroperitoneum and peritoneum: Secondary | ICD-10-CM

## 2016-12-06 DIAGNOSIS — C569 Malignant neoplasm of unspecified ovary: Secondary | ICD-10-CM | POA: Diagnosis not present

## 2016-12-06 LAB — HEPATITIS B CORE ANTIBODY, TOTAL: Hep B Core Total Ab: NEGATIVE

## 2016-12-06 LAB — HEPATITIS B E ANTIGEN: Hep B E Ag: NEGATIVE

## 2016-12-06 LAB — HIV ANTIBODY (ROUTINE TESTING W REFLEX): HIV Screen 4th Generation wRfx: NONREACTIVE

## 2016-12-06 LAB — ANA W/REFLEX: Anti Nuclear Antibody(ANA): NEGATIVE

## 2016-12-06 LAB — HEPATITIS A ANTIBODY, IGM: Hep A IgM: NEGATIVE

## 2016-12-06 LAB — HEPATITIS B CORE ANTIBODY, IGM: Hep B C IgM: NEGATIVE

## 2016-12-06 LAB — ALPHA-1-ANTITRYPSIN: A-1 Antitrypsin, Ser: 191 mg/dL (ref 90–200)

## 2016-12-06 LAB — HEPATITIS A ANTIBODY, TOTAL: Hep A Total Ab: NEGATIVE

## 2016-12-06 LAB — HCV RNA QUANT: HCV Quantitative: NOT DETECTED IU/mL (ref >50)

## 2016-12-06 LAB — CERULOPLASMIN: Ceruloplasmin: 95.4 mg/dL — ABNORMAL HIGH (ref 19.0–39.0)

## 2016-12-06 LAB — HEPATITIS B E ANTIBODY: Hep B E Ab: NEGATIVE

## 2016-12-06 LAB — HEPATITIS B SURFACE ANTIGEN: Hepatitis B Surface Ag: NEGATIVE

## 2016-12-06 MED ORDER — HEPARIN SOD (PORK) LOCK FLUSH 100 UNIT/ML IV SOLN
500.0000 [IU] | Freq: Once | INTRAVENOUS | Status: AC | PRN
  Administered 2016-12-06: 500 [IU]

## 2016-12-06 MED ORDER — MAGNESIUM SULFATE 4 GM/100ML IV SOLN
4.0000 g | Freq: Once | INTRAVENOUS | Status: AC
  Administered 2016-12-06: 4 g via INTRAVENOUS

## 2016-12-06 MED ORDER — SODIUM CHLORIDE 0.9 % IV SOLN: 10.0000 mg/kg | Freq: Once | INTRAVENOUS | Status: DC

## 2016-12-06 MED ORDER — SODIUM CHLORIDE 0.9 % IV SOLN
200.0000 mg | Freq: Once | INTRAVENOUS | Status: AC
  Administered 2016-12-06: 200 mg via INTRAVENOUS
  Filled 2016-12-06: qty 8

## 2016-12-06 MED ORDER — SODIUM CHLORIDE 0.9 % IV SOLN
Freq: Once | INTRAVENOUS | Status: AC
  Administered 2016-12-06: 10:00:00 via INTRAVENOUS
  Filled 2016-12-06: qty 1000

## 2016-12-06 NOTE — Telephone Encounter (Signed)
Spoke with Dr. Mike Gip today. Patient will receive Keytruda today. Since patient has not had Keytruda in the past, I suggested for the first dose give 200mg  flat dose and in 2 weeks reevaluate patient for any adverse reactions. If patient is doing well, dose of Beryle Flock will be increased to 10mg /kg every 2 weeks

## 2016-12-06 NOTE — Telephone Encounter (Signed)
LVM with results advising per Dr. Vicente Males.   Patient can proceed with Chemo. LFT's improving.   Continue with MRI to r/o stones.

## 2016-12-06 NOTE — Telephone Encounter (Signed)
-----   Message from Jonathon Bellows, MD sent at 12/06/2016  9:32 AM EDT ----- Regarding: please arrange appointment  [name]  Please inform patient that LFT's are rapidly improving, Hepatitis B surface antigen when re tested is negative. Likely initially was a false positive.   She can go ahead with chemotherapy. Lets go ahead with MRI to r/o stones in the [place]. I have informed Dr Mike Gip .   C/c Dr Mike Gip   Regards  Kiran  Dr Jonathon Bellows  Gastroenterology/Hepatology Pager: [phone]

## 2016-12-07 LAB — IMMUNOGLOBULINS A/E/G/M, SERUM
IgA: 415 mg/dL (ref 64–422)
IgE (Immunoglobulin E), Serum: 60 IU/mL (ref 0–100)
IgG (Immunoglobin G), Serum: 1090 mg/dL (ref 700–1600)
IgM, Serum: 1038 mg/dL — ABNORMAL HIGH (ref 26–217)

## 2016-12-09 ENCOUNTER — Ambulatory Visit
Admission: RE | Admit: 2016-12-09 | Discharge: 2016-12-09 | Disposition: A | Discharge status: Home / Self Care | Payer: Medicare Other | Source: Ambulatory Visit | Attending: Gastroenterology | Admitting: Gastroenterology

## 2016-12-09 DIAGNOSIS — K7689 Other specified diseases of liver: Secondary | ICD-10-CM | POA: Insufficient documentation

## 2016-12-09 DIAGNOSIS — C782 Secondary malignant neoplasm of pleura: Secondary | ICD-10-CM | POA: Diagnosis not present

## 2016-12-09 DIAGNOSIS — R945 Abnormal results of liver function studies: Secondary | ICD-10-CM | POA: Diagnosis present

## 2016-12-09 MED ORDER — GADOBENATE DIMEGLUMINE 529 MG/ML IV SOLN
15.0000 mL | Freq: Once | INTRAVENOUS | Status: AC | PRN
Start: 2016-12-09 — End: 2016-12-09
  Administered 2016-12-09: 12 mL via INTRAVENOUS

## 2016-12-13 ENCOUNTER — Telehealth: Payer: Self-pay | Admitting: *Deleted

## 2016-12-13 ENCOUNTER — Inpatient Hospital Stay: Payer: Medicare Other

## 2016-12-13 DIAGNOSIS — C786 Secondary malignant neoplasm of retroperitoneum and peritoneum: Secondary | ICD-10-CM

## 2016-12-13 DIAGNOSIS — C569 Malignant neoplasm of unspecified ovary: Secondary | ICD-10-CM | POA: Diagnosis not present

## 2016-12-13 DIAGNOSIS — C801 Malignant (primary) neoplasm, unspecified: Principal | ICD-10-CM

## 2016-12-13 LAB — COMPREHENSIVE METABOLIC PANEL
ALT: 12 U/L — ABNORMAL LOW (ref 14–54)
AST: 16 U/L (ref 15–41)
Albumin: 2.9 g/dL — ABNORMAL LOW (ref 3.5–5.0)
Alkaline Phosphatase: 100 U/L (ref 38–126)
Anion gap: 6 (ref 5–15)
BUN: 16 mg/dL (ref 6–20)
CO2: 29 mmol/L (ref 22–32)
Calcium: 9.1 mg/dL (ref 8.9–10.3)
Chloride: 98 mmol/L — ABNORMAL LOW (ref 101–111)
Creatinine, Ser: 0.83 mg/dL (ref 0.44–1.00)
GFR calc Af Amer: >60 mL/min (ref >60)
GFR calc non Af Amer: >60 mL/min (ref >60)
Glucose, Bld: 275 mg/dL — ABNORMAL HIGH (ref 65–99)
Potassium: 4.4 mmol/L (ref 3.5–5.1)
Sodium: 133 mmol/L — ABNORMAL LOW (ref 135–145)
Total Bilirubin: 0.6 mg/dL (ref 0.3–1.2)
Total Protein: 6.9 g/dL (ref 6.5–8.1)

## 2016-12-13 LAB — CBC WITH DIFFERENTIAL/PLATELET
Basophils Absolute: 0 10*3/uL (ref 0–0.1)
Basophils Relative: 1 %
Eosinophils Absolute: 0 10*3/uL (ref 0–0.7)
Eosinophils Relative: 0 %
HCT: 28.5 % — ABNORMAL LOW (ref 35.0–47.0)
Hemoglobin: 9.6 g/dL — ABNORMAL LOW (ref 12.0–16.0)
Lymphocytes Relative: 26 %
Lymphs Abs: 0.5 10*3/uL — ABNORMAL LOW (ref 1.0–3.6)
MCH: 29.4 pg (ref 26.0–34.0)
MCHC: 33.7 g/dL (ref 32.0–36.0)
MCV: 87.4 fL (ref 80.0–100.0)
Monocytes Absolute: 0.4 10*3/uL (ref 0.2–0.9)
Monocytes Relative: 19 %
Neutro Abs: 1 10*3/uL — ABNORMAL LOW (ref 1.4–6.5)
Neutrophils Relative %: 54 %
Platelets: 193 10*3/uL (ref 150–440)
RBC: 3.25 MIL/uL — ABNORMAL LOW (ref 3.80–5.20)
RDW: 16.1 % — ABNORMAL HIGH (ref 11.5–14.5)
WBC: 1.9 10*3/uL — ABNORMAL LOW (ref 3.6–11.0)

## 2016-12-13 LAB — MAGNESIUM: Magnesium: 1.4 mg/dL — ABNORMAL LOW (ref 1.7–2.4)

## 2016-12-13 MED ORDER — MAGNESIUM SULFATE 4 GM/100ML IV SOLN
4.0000 g | Freq: Once | INTRAVENOUS | Status: AC
  Administered 2016-12-13: 4 g via INTRAVENOUS
  Filled 2016-12-13: qty 100

## 2016-12-13 MED ORDER — SODIUM CHLORIDE 0.9 % IV SOLN
INTRAVENOUS | Status: DC
  Administered 2016-12-13: 10:00:00 via INTRAVENOUS
  Filled 2016-12-13: qty 1000

## 2016-12-13 MED ORDER — HEPARIN SOD (PORK) LOCK FLUSH 100 UNIT/ML IV SOLN
500.0000 [IU] | Freq: Once | INTRAVENOUS | Status: AC
  Administered 2016-12-13: 500 [IU] via INTRAVENOUS

## 2016-12-13 NOTE — Telephone Encounter (Signed)
Called pt and let her know that wbc 1.9, neutrophil 1.0.  Since her wbc are low that she needs to be on neutropenic precautions.  Good hanwashing, use hand sanitizer when needed, call if temp over 100.4 day or night. Staying away from people who are sick and if she goes to stores try to go on the non crowded time . She does get out very much and she already uses above precautions. She will call for fever.

## 2016-12-13 NOTE — Telephone Encounter (Signed)
-----   Message from Lequita Asal, MD sent at 12/13/2016 12:46 PM EDT ----- Regarding: Please call patient   Counts decreased.  Neutropenic precautions.  M  ----- Message ----- From: Interface, Lab In [place] Sent: 12/13/2016   9:59 AM To: Lequita Asal, MD

## 2016-12-14 DIAGNOSIS — D709 Neutropenia, unspecified: Secondary | ICD-10-CM | POA: Insufficient documentation

## 2016-12-15 ENCOUNTER — Other Ambulatory Visit: Payer: Medicare Other

## 2016-12-15 ENCOUNTER — Ambulatory Visit: Payer: Medicare Other

## 2016-12-15 ENCOUNTER — Ambulatory Visit: Payer: Medicare Other | Admitting: Hematology and Oncology

## 2016-12-20 ENCOUNTER — Ambulatory Visit: Payer: Medicare Other

## 2016-12-20 ENCOUNTER — Ambulatory Visit: Payer: Medicare Other | Admitting: Hematology and Oncology

## 2016-12-20 ENCOUNTER — Inpatient Hospital Stay: Payer: Medicare Other

## 2016-12-20 ENCOUNTER — Telehealth: Payer: Self-pay | Admitting: *Deleted

## 2016-12-20 ENCOUNTER — Other Ambulatory Visit: Payer: Medicare Other

## 2016-12-20 ENCOUNTER — Inpatient Hospital Stay (HOSPITAL_BASED_OUTPATIENT_CLINIC_OR_DEPARTMENT_OTHER): Payer: Medicare Other | Admitting: Hematology and Oncology

## 2016-12-20 VITALS — BP 126/68 | HR 76 | Temp 97.0°F | Resp 18 | Wt 136.4 lb

## 2016-12-20 DIAGNOSIS — D6481 Anemia due to antineoplastic chemotherapy: Secondary | ICD-10-CM

## 2016-12-20 DIAGNOSIS — M069 Rheumatoid arthritis, unspecified: Secondary | ICD-10-CM | POA: Diagnosis not present

## 2016-12-20 DIAGNOSIS — K449 Diaphragmatic hernia without obstruction or gangrene: Secondary | ICD-10-CM

## 2016-12-20 DIAGNOSIS — C569 Malignant neoplasm of unspecified ovary: Secondary | ICD-10-CM

## 2016-12-20 DIAGNOSIS — C786 Secondary malignant neoplasm of retroperitoneum and peritoneum: Secondary | ICD-10-CM

## 2016-12-20 DIAGNOSIS — I2510 Atherosclerotic heart disease of native coronary artery without angina pectoris: Secondary | ICD-10-CM | POA: Diagnosis not present

## 2016-12-20 DIAGNOSIS — R7989 Other specified abnormal findings of blood chemistry: Secondary | ICD-10-CM

## 2016-12-20 DIAGNOSIS — K219 Gastro-esophageal reflux disease without esophagitis: Secondary | ICD-10-CM | POA: Diagnosis not present

## 2016-12-20 DIAGNOSIS — R5383 Other fatigue: Secondary | ICD-10-CM

## 2016-12-20 DIAGNOSIS — Z808 Family history of malignant neoplasm of other organs or systems: Secondary | ICD-10-CM

## 2016-12-20 DIAGNOSIS — C801 Malignant (primary) neoplasm, unspecified: Secondary | ICD-10-CM

## 2016-12-20 DIAGNOSIS — E876 Hypokalemia: Secondary | ICD-10-CM

## 2016-12-20 DIAGNOSIS — D638 Anemia in other chronic diseases classified elsewhere: Secondary | ICD-10-CM | POA: Diagnosis not present

## 2016-12-20 DIAGNOSIS — C782 Secondary malignant neoplasm of pleura: Secondary | ICD-10-CM

## 2016-12-20 DIAGNOSIS — D708 Other neutropenia: Secondary | ICD-10-CM

## 2016-12-20 DIAGNOSIS — R945 Abnormal results of liver function studies: Secondary | ICD-10-CM

## 2016-12-20 DIAGNOSIS — I10 Essential (primary) hypertension: Secondary | ICD-10-CM

## 2016-12-20 DIAGNOSIS — Z794 Long term (current) use of insulin: Secondary | ICD-10-CM

## 2016-12-20 DIAGNOSIS — Z86718 Personal history of other venous thrombosis and embolism: Secondary | ICD-10-CM

## 2016-12-20 DIAGNOSIS — I5032 Chronic diastolic (congestive) heart failure: Secondary | ICD-10-CM

## 2016-12-20 DIAGNOSIS — Z66 Do not resuscitate: Secondary | ICD-10-CM

## 2016-12-20 DIAGNOSIS — Z87891 Personal history of nicotine dependence: Secondary | ICD-10-CM

## 2016-12-20 DIAGNOSIS — N1330 Unspecified hydronephrosis: Secondary | ICD-10-CM

## 2016-12-20 DIAGNOSIS — E785 Hyperlipidemia, unspecified: Secondary | ICD-10-CM

## 2016-12-20 DIAGNOSIS — E109 Type 1 diabetes mellitus without complications: Secondary | ICD-10-CM

## 2016-12-20 DIAGNOSIS — I252 Old myocardial infarction: Secondary | ICD-10-CM

## 2016-12-20 LAB — CBC WITH DIFFERENTIAL/PLATELET
Basophils Absolute: 0 10*3/uL (ref 0–0.1)
Basophils Relative: 1 %
Eosinophils Absolute: 0 10*3/uL (ref 0–0.7)
Eosinophils Relative: 1 %
HCT: 28.3 % — ABNORMAL LOW (ref 35.0–47.0)
Hemoglobin: 9.6 g/dL — ABNORMAL LOW (ref 12.0–16.0)
Lymphocytes Relative: 47 %
Lymphs Abs: 0.8 10*3/uL — ABNORMAL LOW (ref 1.0–3.6)
MCH: 28.9 pg (ref 26.0–34.0)
MCHC: 33.8 g/dL (ref 32.0–36.0)
MCV: 85.6 fL (ref 80.0–100.0)
Monocytes Absolute: 0.5 10*3/uL (ref 0.2–0.9)
Monocytes Relative: 27 %
Neutro Abs: 0.4 10*3/uL — ABNORMAL LOW (ref 1.4–6.5)
Neutrophils Relative %: 24 %
Platelets: 220 10*3/uL (ref 150–440)
RBC: 3.31 MIL/uL — ABNORMAL LOW (ref 3.80–5.20)
RDW: 16.3 % — ABNORMAL HIGH (ref 11.5–14.5)
WBC: 1.7 10*3/uL — ABNORMAL LOW (ref 3.6–11.0)

## 2016-12-20 LAB — COMPREHENSIVE METABOLIC PANEL
ALT: 9 U/L — ABNORMAL LOW (ref 14–54)
AST: 14 U/L — ABNORMAL LOW (ref 15–41)
Albumin: 3 g/dL — ABNORMAL LOW (ref 3.5–5.0)
Alkaline Phosphatase: 72 U/L (ref 38–126)
Anion gap: 6 (ref 5–15)
BUN: 19 mg/dL (ref 6–20)
CO2: 29 mmol/L (ref 22–32)
Calcium: 9 mg/dL (ref 8.9–10.3)
Chloride: 98 mmol/L — ABNORMAL LOW (ref 101–111)
Creatinine, Ser: 0.68 mg/dL (ref 0.44–1.00)
GFR calc Af Amer: >60 mL/min (ref >60)
GFR calc non Af Amer: >60 mL/min (ref >60)
Glucose, Bld: 213 mg/dL — ABNORMAL HIGH (ref 65–99)
Potassium: 4 mmol/L (ref 3.5–5.1)
Sodium: 133 mmol/L — ABNORMAL LOW (ref 135–145)
Total Bilirubin: 0.5 mg/dL (ref 0.3–1.2)
Total Protein: 7.2 g/dL (ref 6.5–8.1)

## 2016-12-20 LAB — MAGNESIUM: Magnesium: 1.3 mg/dL — ABNORMAL LOW (ref 1.7–2.4)

## 2016-12-20 MED ORDER — HEPARIN SOD (PORK) LOCK FLUSH 100 UNIT/ML IV SOLN
500.0000 [IU] | Freq: Once | INTRAVENOUS | Status: AC
  Administered 2016-12-20: 500 [IU] via INTRAVENOUS

## 2016-12-20 MED ORDER — MAGNESIUM SULFATE 4 GM/100ML IV SOLN
4.0000 g | Freq: Once | INTRAVENOUS | Status: AC
  Administered 2016-12-20: 4 g via INTRAVENOUS
  Filled 2016-12-20: qty 100

## 2016-12-20 MED ORDER — SODIUM CHLORIDE 0.9% FLUSH
3.0000 mL | Freq: Once | INTRAVENOUS | Status: DC | PRN
  Filled 2016-12-20: qty 3

## 2016-12-20 MED ORDER — TBO-FILGRASTIM 300 MCG/0.5ML ~~LOC~~ SOSY
300.0000 ug | PREFILLED_SYRINGE | Freq: Once | SUBCUTANEOUS | Status: AC
  Administered 2016-12-20: 300 ug via SUBCUTANEOUS
  Filled 2016-12-20: qty 0.5

## 2016-12-20 NOTE — Telephone Encounter (Signed)
Critical Lab - ANC 0.4  MD notified.

## 2016-12-20 NOTE — Progress Notes (Signed)
Patient states she has had increased joint pain.  Dr. Jefm Bryant has increased her prednisone to 2 5 mg tablets daily. As a result her glucose increases.  Patient wants to discuss Keytruda and whether or not she wants to continue taking the drug.

## 2016-12-20 NOTE — Progress Notes (Signed)
[facility] day:  12/20/2016   Chief Complaint: [name] is a 71 y.o. female with stage IV ovarian cancer who is seen for assessment prior to cycle #2 pembrolizumab.  HPI:  The patient was last seen in the medical oncology clinic on 11/30/2016.  At that time, she LFTs were improving.  Hepatitis serolgies were negative except for hepatitis B surface antigen.  She was seen by Dr. Marylene Land of GI on 12/05/2016.  LFTs continued to improve.  She underwent extensive testing.  Repeat hepatitis B surface antigen was negative.  She was cleared for initiation of treatment on 12/06/2016.  Decision was made to slowly increase dose.  MRCP on 12/09/2016 revealed the cystic lesion within the pancreatic head/uncinate process had decreased in size and demonstrated no suspicious characteristics. This can be presumed a pseudocyst or focus of side branch duct ectasia.  There was similar to slight progression of extensive peritoneal metastasis since 11/29/2016. Grossly similar abdominal nodal and right pleural metastasis.  There was chronic mild common duct dilatation with similar soft tissue fullness about the ampulla compared to 11/29/2016.  There was improved to resolved left-sided hydronephrosis.  CBC on 12/13/2016 revealed a hematocrit of 28.5, hemoglobin 9.6, platelets 193,000, WBC 1900 with an ANC of 1000.  LFTs were normal.  Magnesium was 1.4.  She received IV magnesium.  During the interim, patient has continued to have polyarthralgia. She has been seen by rheumatology. She is on prednisone 55m daily. Patient reports that her blood sugars have subsequently been running high.  She sees endocrinology on Friday of this week. Patient reporting that she is experiencing significant fatigue. She has had some intermittent nausea, however it has been well controlled with the prescribed interventions.    Past Medical History:  Diagnosis Date  . CAD (coronary artery disease)     a. 09/2012 Cath: LM nl, LAD 95p, 74mLCX 9065mM2 50, RCA 100.  . CMarland Kitchenolelithiasis   . Chronic diastolic CHF (congestive heart failure) ([place] . Collagen vascular disease (HCC)    Rheumatoid Arthritis.  . Esophageal stricture   . Exertional shortness of breath   . GERD (gastroesophageal reflux disease)   . H/O hiatal hernia   . Herniated disc   . History of pancytopenia   . Hyperlipidemia   . Hypertension   . Hypokalemia   . Leukopenia 2012   s/p bone marrow biopsy, Dr. PanMa Hillock NSTEMI (non-ST elevated myocardial infarction) ([place]/2014   "mild" (10/18/2012)  . Ovarian cancer ([place]   chemo and hysterectomy  . Pneumonia 2013; 08/2012   "one lung; double" (10/18/2012)  . Rheumatoid arthritis(714.0)   . Type I diabetes mellitus ([place]  "dx'd in 1957" (10/18/2012)    Past Surgical History:  Procedure Laterality Date  . ABDOMINAL HYSTERECTOMY  06/15/2015   Dx L/S, EXLAP TAH BSO omentectomy RSRx colostomy diaphragm resection stripping  . CARDIAC CATHETERIZATION  10/18/2012   "first one was today" (10/18/2012)  . CATARACT EXTRACTION W/ INTRAOCULAR LENS IMPLANT Right 2010  . CHOLECYSTECTOMY  06/15/2015   combined case with ovarian cancer debulking  . COLON SURGERY    . CORONARY ARTERY BYPASS GRAFT N/A 10/19/2012   Procedure: CORONARY ARTERY BYPASS GRAFTING (CABG);  Surgeon: SteMelrose NakayamaD;  [facility];  Laterality: N/A;  . ESOPHAGEAL DILATION     "3 or 4 times" (10/19/2011)  . ESOPHAGOGASTRODUODENOSCOPY  2012   Dr.  Iftikar  . OSTOMY    . OVARY SURGERY     removal  . PERIPHERAL VASCULAR CATHETERIZATION N/A 03/02/2015   Procedure: Glori Luis Cath Insertion;  Surgeon: Algernon Huxley, MD;  Location: [facility];  Service: Cardiovascular;  Laterality: N/A;  . TUBAL LIGATION  1970    Family History  Problem Relation Age of Onset  . Diabetes Mother   . Arthritis Mother   . Diabetes Father   . Arthritis Father   . Bone cancer  Sister     Social History:  reports that she quit smoking about 22 years ago. Her smoking use included Cigarettes. She has a 30.00 pack-year smoking history. She has never used smokeless tobacco. She reports that she does not drink alcohol or use drugs.  She is accompanied by her husband today.  Allergies:  Allergies  Allergen Reactions  . Codeine Nausea And Vomiting  . Latex Rash    Current Medications: Current Outpatient Prescriptions  Medication Sig Dispense Refill  . Calcium-Vitamin D 600-200 MG-UNIT tablet Take 1 tablet by mouth 2 (two) times daily.     . cholecalciferol (VITAMIN D) 1000 units tablet Take 1,000 Units by mouth daily.    Marland Kitchen enoxaparin (LOVENOX) 60 MG/0.6ML injection INJECT 0.6 ML (60 MG TOTAL) INTO THE SKIN EVERY 12 HOURS 60 mL 2  . folic acid (FOLVITE) 1 MG tablet Take 1 tablet (1 mg total) by mouth daily. 90 tablet 3  . furosemide (LASIX) 80 MG tablet Take 0.5 tablets by mouth 2 (two) times daily.    . insulin lispro (HUMALOG) 100 UNIT/ML injection Inject 0.06 mLs (6 Units total) into the skin daily. (Patient taking differently: Inject 0-85 Units into the skin daily. Via insulin pump) 30 mL 3  . lidocaine-prilocaine (EMLA) cream APPLY 1 APPLICATION TOPICALLY AS NEEDED 1 HOUR PRIOR TO TREATMENT. COVER WITH PRESS N SEAL UNTIL TREATMENT TIME AS DIRECTED 30 g 1  . loratadine (CLARITIN) 10 MG tablet Take 1 tablet by mouth daily as needed.    . magnesium oxide (MAG-OX) 400 (241.3 Mg) MG tablet TAKE 1 TABLET BY MOUTH ONCE DAILY 30 tablet 1  . metoprolol tartrate (LOPRESSOR) 25 MG tablet Take 1 tablet (25 mg total) by mouth 2 (two) times daily. 180 tablet 3  . ondansetron (ZOFRAN) 8 MG tablet TAKE 1 TABLET BY MOUTH EVERY 8 HOURS AS NEEDED FOR NAUSEA OR VOMITING 30 tablet 1  . pantoprazole (PROTONIX) 40 MG tablet TAKE 1 TABLET BY MOUTH TWICE (2) DAILY 180 tablet 1  . potassium chloride SA (K-DUR,KLOR-CON) 20 MEQ tablet TAKE 1 TABLET BY MOUTH DAILY 30 tablet 2  . predniSONE  (DELTASONE) 5 MG tablet Take 5 mg by mouth daily with breakfast.    . vitamin C (ASCORBIC ACID) 500 MG tablet Take 500 mg by mouth daily.    Marland Kitchen zinc gluconate 50 MG tablet Take 50 mg by mouth daily.     Current Facility-Administered Medications  Medication Dose Route Frequency Provider Last Rate Last Dose  . Tbo-Filgrastim (GRANIX) injection 300 mcg  300 mcg Subcutaneous Once Lequita Asal, MD       Facility-Administered Medications Ordered in Other Visits  Medication Dose Route Frequency Provider Last Rate Last Dose  . sodium chloride 0.9 % injection 10 mL  10 mL Intracatheter PRN Corcoran, Melissa C, MD      . sodium chloride 0.9 % injection 10 mL  10 mL Intravenous PRN Lequita Asal, MD   10 mL at 04/13/15 [phone]  Review of Systems:  GENERAL:  Fatigue.  No fever, chills or sweats.  Weight down 1 pound. PERFORMANCE STATUS (ECOG):  1 HEENT:  No visual changes, sore throat, mouth sores or tenderness. Lungs:  No shortness of breath.  No cough.  No hemoptysis. Cardiac:  No chest pain, palpitations, orthopnea, or PND. GI:  Upper abdominal pain.  No nausea, vomiting, diarrhea, melena or hematochezia. GU:  No urgency, frequency, dysuria, or hematuria. Musculoskeletal:  Severe rheumatoid arthritis (off MTX and on prednisone).  Arthralgias worse after treatment.  Osteoporosis.  No back pain. No muscle tenderness. Extremities:  No pain or swelling. Skin:  No increased bruising or bleeding.  No rashes or skin changes. Neuro:  Neuropathy (stable).  No headache, numbness or weakness, balance or coordination issues. Endocrine:  Diabetes on an insulin pump.  Blood sugar elevated.  Occasional hot flashes.  Psych:  No mood changes, depression or anxiety. Pain: No pain. Review of systems:  All other systems reviewed and found to be negative.  Physical Exam:  Blood pressure 126/68, pulse 76, temperature (!) 97 F (36.1 C), temperature source Tympanic, resp. rate 18, weight 136 lb 6 oz  (61.9 kg). GENERAL:  Well developed, well nourished woman sitting comfortably in the exam room in no acute distress. MENTAL STATUS:  Alert and oriented to person, place and time. HEAD:  Alopecia.  Normocephalic, atraumatic, face symmetric, no Cushingoid features. EYES:  Gold rimmed glasses.  Blue eyes.  No conjunctivitis or scleral icterus.  ENT: Oropharynx clear without lesion. Tongue normal. Mucous membranes moist.  RESPIRATORY: Clear to auscultation without rales, wheezes or rhonchi. CARDIOVASCULAR: Regular rate and rhythm without murmur, rub or gallop. ABDOMEN: Soft, , non-tender with active bowel sounds and no hepatosplenomegaly. No palpable nodularity or masses.  Insulin pump.  Ostomy bag. SKIN: No rashes or ulcers. EXTREMITIES:  No edema, skin discoloration or tenderness. No palpable cords. LYMPH NODES: No palpable cervical, supraclavicular, axillary or inguinal adenopathy  NEUROLOGICAL: Appropriate. PSYCH: Appropriate.   Radiology studies: 02/13/2015:  Abdomen and pelvic CT revealed bilateral mass-like adnexal regions (right adnexal mass 5.6 x 5.0 cm and the left adnexa mass 10.3 x 6.0 x 9.9 cm).  There was a large amount of soft tissue throughout the peritoneal cavity involving the omentum and other peritoneal surfaces.  There was a small volume ascites. There was a peripheral 3.3 x 1.9 cm low-attenuation lesion overlying the right lobe of the liver, likely representing a serosal implant. There was 1.4 x 2.2 cm ill-defined peripheral lesion within the inferior aspect of segment 6 adjacent to the ampulla in the duodenum.  04/28/2015:  Abdominal and pelvic CT revealed decreasing bilateral ovarian masses.   The left adnexal mass measured 4.0 x 6.1 cm (previously 5.0 x 8.5 cm). The right ovary measured 3.8 x 4.9 cm (previously 4.7 x 5.6 cm).  There was improved peritoneal carcinomatosis.  There was a small amount of ascites.  The hepatic dome lesion was stable. The previously seen  right hepatic lobe lesion was not well-visualized.  There was a small left pleural effusion and trace right pleural effusion.  The nodular lesion at the ampulla of Vater, extending into the duodenum was stable.  There was no biliary ductal dilatation. 08/01/2015:  Rght upper extremity ultrasound revealed a near occlusive thrombus within the central portion of the right internal jugular vein and central portion of the right subclavian vein. 09/01/2015:  Chest, abdomen, and pelvic CT revealed continued decrease in perihepatic fluid collection contiguous with the right pleural  space with percutaneous drain.  09/11/2015:  Chest, abdomen, and pelvic CT revealed a residual versus recurrent fluid collection posterior to the right hepatic lobe (2.6 x 1.1 x 4.0 cm).   10/13/2015:  Chest, abdomen, and pelvic CT revealed resolution of the empyema. 02/15/2016:  Chest, abdomen, and pelvic CT revealed multiple soft tissue nodules throughout the pelvis, small bowel mesenteric and peritoneum and, concerning for peritoneal metastatic disease.  There was soft tissue irregularity within the left upper quadrant.  There was mild right hydronephrosis (etiology unclear).  There was interval resolution of previously described right pleural based fluid and gas collection. There was a pleural based nodule within the right lower hemi-thorax concerning for pleural based metastasis.  There were small bilateral pleural effusions.  There was pulmonary nodularity, predominately within the left upper lobe (metastatic or infectious/inflammatory etiology).  There was slightly increased mediastinal adenopathy (infectious/inflammatory or metastatic).  There was a low attenuation lesion within the left hepatic lobe (complicated fluid within the fissure or metastatic disease). 03/08/2016:  Abdomen and pelvic CT revealed mild progression of peritoneal disease in the abdomen.  There was interval increase in loculated fluid around the lateral segment  left liver, stomach, and spleen.  There was persistent soft tissue lesion at the level of the ampulla with mild intra and extrahepatic biliary duct dilatation.  There was persistent intrahepatic and capsular metastatic disease involving the liver. 05/26/2016:  Head MRI revealed multiple small areas of acute infarct involving the occipital parietal lobe bilaterally and the left lateral cerebellum,  consistent with posterior circulation emboli.  06/10/2016:  Adomen and pelvic CT revealed progression of peritoneal carcinomatosis predominantly in the perihepatic space, bilateral lower quadrants and pelvis.  There was worsening bilateral obstructive uropathy due to malignant involvement of the pelvic ureters.  There was stable small pleural/subpleural nodules at the right lung base.  There was stable small left pleural effusion.  There was decreased small volume perihepatic ascites.  08/09/2016:  Abdomen and pelvic CT revealed interval increase and loculated fluid collections within the peritoneal space consistent with progression of peritoneal ovarian carcinoma metastasis.  There was one large 7 cm collection in the RIGHT lower quadrant which had increased significantly in size and may represent of point of small bowel obstruction.  The proximal stomach and duodenum were decompressed. The mid small bowel was mildly dilated. There was concern for obstruction given the large intraperitoneal fluid collection.  There was increase in subcapsular hepatic metastatic fluid collections.  There was enlargement of rounded ampullary lesion in the second portion duodenum concerning for metastatic lesion.  There was mild biliary duct dilatation (stable).  There was nodular pleural metastasis in the RIGHT lower lobe pleural space. 11/03/2016:  Abdomen and pelvic CT revealed multifocal cystic metastases in the abdomen/pelvis, reflecting peritoneal disease.  The dominant lesion in the left anterior abdomen has progressed, while  additional lesions were mixed.  Peritoneal disease along the liver and spleen progressed.  Pleural-based metastases along the posterior right hemithorax progressed. There were trace left pleural effusion.  There was a stable 1.7 cm duodenal lesion at the ampulla. 11/29/2016:  Abdomen and pelvic CT revealed mild interval enlargement of pleural metastasis the RIGHT lower lobe.  There was overall mild decrease in cystic peritoneal metastasis within the abdomen pelvis. The solid lesion at the terminal ileum was decreased in size. Subcapsular lesion in the liver was slightly decreased in size.  There was no evidence of disease progression the abdomen pelvis.  There was mild intrahepatic duct dilatation similar  prior.  The ampullary lesion was slightly larger.  There was mild hydronephrosis and hydroureter on the LEFT likely related to cystic metastasis at the LEFT vesicoureteral junction 12/09/2016:  MRCP revealed the cystic lesion within the pancreatic head/uncinate process had decreased in size and demonstrated no suspicious characteristics. This can be presumed a pseudocyst or focus of side branch duct ectasia.  There was similar to slight progression of extensive peritoneal metastasis since 11/29/2016. Grossly similar abdominal nodal and right pleural metastasis.  There was chronic mild common duct dilatation with similar soft tissue fullness about the ampulla compared to 11/29/2016.  There was improved to resolved left-sided hydronephrosis.  Admissions: [place] from 06/15/2015 - 06/22/2015.  She underwent exploratory laparotomy, lysis of adhesions, total abdominal hysterectomy with bilateral salpingo-oophorectomy, infracolic omentectomy, optimal tumor debulking(< 1 cm), recto-sigmoid resection with creation of end colostomy, cholecystectomy, mobilization of splenic flexure and liver with diaphragmatic stripping on 06/15/2015. The right diaphragm was cleared of tumor. During dissection, the diaphragm was entered  and closed with sutures.  [place] from 07/01/2015 - 07/03/2015 with pleural effusion. [place] from 07/07/2015 - 07/09/2015 with recurrent pleural effusion. [place] from 07/29/2015 - 08/10/2015 with a right-sided empyema and liver abscess. She underwent CT-guided placement of a liver abscess drain on 07/30/2015.  Liver abscess culture grew out group B strep and Enterobacter which was sensitive to Zosyn. She was transitioned to ertapenem Colbert Ewing) prior to discharge.  She was readmitted to [facility] on 09/17/2015. 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  Head MRI on 05/26/2016 revealed multiple small areas of acute infarct involving the occipital parietal lobe bilaterally and the left lateral cerebellum,  consistent with posterior circulation emboli.  Work-up included a negative carotid ultrasound and echocardiogram. [facility] from 08/10/2016 - 08/14/2016 with a small bowel obstruction.  She was managed conservatively. [place] from 09/27/2016 - 09/28/2016 with symptomatic anemia and diarrhea.   Appointment on 12/20/2016  Component Date Value Ref Range Status  . WBC 12/20/2016 1.7* 3.6 - 11.0 K/uL Final  . RBC 12/20/2016 3.31* 3.80 - 5.20 MIL/uL Final  . Hemoglobin 12/20/2016 9.6* 12.0 - 16.0 g/dL Final  . HCT 12/20/2016 28.3* 35.0 - 47.0 % Final  . MCV 12/20/2016 85.6  80.0 - 100.0 fL Final  . MCH 12/20/2016 28.9  26.0 - 34.0 pg Final  . MCHC 12/20/2016 33.8  32.0 - 36.0 g/dL Final  . RDW 12/20/2016 16.3* 11.5 - 14.5 % Final  . Platelets 12/20/2016 220  150 - 440 K/uL Final  . Neutrophils Relative % 12/20/2016 24  % Final  . Neutro Abs 12/20/2016 0.4* 1.4 - 6.5 K/uL Final   Comment: RESULT REPEATED AND VERIFIED CRITICAL RESULT CALLED TO, READ BACK BY AND VERIFIED WITH: ANITA BLACK @ 9:47 AM 12/20/2016 LGR   . Lymphocytes Relative 12/20/2016 47  % Final  . Lymphs Abs 12/20/2016 0.8* 1.0 - 3.6 K/uL Final  . Monocytes Relative 12/20/2016 27  % Final  . Monocytes  Absolute 12/20/2016 0.5  0.2 - 0.9 K/uL Final  . Eosinophils Relative 12/20/2016 1  % Final  . Eosinophils Absolute 12/20/2016 0.0  0 - 0.7 K/uL Final  . Basophils Relative 12/20/2016 1  % Final  . Basophils Absolute 12/20/2016 0.0  0 - 0.1 K/uL Final  . Sodium 12/20/2016 133* 135 - 145 mmol/L Final  . Potassium 12/20/2016 4.0  3.5 - 5.1 mmol/L Final  . Chloride 12/20/2016 98* 101 - 111 mmol/L Final  . CO2 12/20/2016 29  22 -  32 mmol/L Final  . Glucose, Bld 12/20/2016 213* 65 - 99 mg/dL Final  . BUN 12/20/2016 19  6 - 20 mg/dL Final  . Creatinine, Ser 12/20/2016 0.68  0.44 - 1.00 mg/dL Final  . Calcium 12/20/2016 9.0  8.9 - 10.3 mg/dL Final  . Total Protein 12/20/2016 7.2  6.5 - 8.1 g/dL Final  . Albumin 12/20/2016 3.0* 3.5 - 5.0 g/dL Final  . AST 12/20/2016 14* 15 - 41 U/L Final  . ALT 12/20/2016 9* 14 - 54 U/L Final  . Alkaline Phosphatase 12/20/2016 72  38 - 126 U/L Final  . Total Bilirubin 12/20/2016 0.5  0.3 - 1.2 mg/dL Final  . GFR calc non Af Amer 12/20/2016 >60  >60 mL/min Final  . GFR calc Af Amer 12/20/2016 >60  >60 mL/min Final   Comment: (NOTE) The eGFR has been calculated using the CKD EPI equation. This calculation has not been validated in all clinical situations. eGFR's persistently <60 mL/min signify possible Chronic Kidney Disease.   . Anion gap 12/20/2016 6  5 - 15 Final  . Magnesium 12/20/2016 1.3* 1.7 - 2.4 mg/dL Final    Assessment:  [name] is a 71 y.o. female with progressive stage IV ovarian cancer.  She presented with abdominal discomfort and bloating.  Omental biopsy on 02/23/2015 revealed metastatic high grade serous carcinoma, consistent with gynecologic origin.   She was initially diagnosed with clinical stage IIIC (T3cN1Mx).  CA125 was 707 on 02/17/2015.  She received 4 cycles of neoadjuvant carboplatin and Taxol (03/05/2015 - 05/22/2015).  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  She had loose stools on oral magnesium.  She was  initially on Neurontin then switched Lyrica with cycle #3.  Cycle #4 was notable for neutropenia (ANC 300) requiring GCSF x 3 days.    She underwent exploratory laparotomy, lysis of adhesions, total abdominal hysterectomy with bilateral salpingo-oophorectomy, infracolic omentectomy, optimal tumor debulking(< 1 cm), recto-sigmoid resection with creation of end colostomy, cholecystectomy, mobilization of splenic flexure and liver with diaphragmatic stripping on 06/15/2015. The right diaphragm was cleared of tumor. During dissection, the diaphragm was entered and closed with sutures.  She received tamoxifen from 02/25/2016 - 03/14/2016.  She received 4 cycles of carboplatin and gemcitabine (03/21/2016 - 05/24/2016) with GCSF/Neulasta support.  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  PDL-1 testing revealed a combined score was 5 (>=1 positive).     She received 2 cycles of Doxil (06/21/2016 - 07/19/2016) with Neulasta support.  She received 4 cycles of topotecan with Neulasta support (08/22/2016 - 10/10/2016).  She is s/p 1 cycle of pembrolizumab (Keytruda) on 12/06/2016.  Abdomen and pelvic CT on 11/29/2016 revealed mild interval enlargement of pleural metastasis the RIGHT lower lobe.  There was overall mild decrease in cystic peritoneal metastasis within the abdomen pelvis. The solid lesion at the terminal ileum was decreased in size. Subcapsular lesion in the liver was slightly decreased in size.  There was no evidence of disease progression the abdomen pelvis.  There was mild intrahepatic duct dilatation similar prior.  The ampullary lesion was slightly larger.  There was mild hydronephrosis and hydroureter on the LEFT likely related to cystic metastasis at the LEFT vesicoureteral junction  CA125 was 802.9 on 03/30/2015, 567.9 on 04/13/2015, 168.8 on 05/15/2015, 85.2 on 07/17/2015, 68.6 on 07/28/2015, 34.5 on 09/17/2015, 20.7 on 11/06/2015, 49 on 01/22/2016, 106.1 on 02/22/2016,  138.8 on 03/07/2016, 220.8 on 03/21/2016, 152.8 on 04/11/2016, 125.6 on 04/18/2016, 76.8 on 05/03/2016,  60.2 on 05/24/2016, 44 on 07/19/2016, 65.8 on 08/17/2016, 53.4 on 09/12/2016, 47.3 on 10/10/2016, and 91 on 12/20/2016.  She has a history of recurrent right sided pleural effusion.  She underwent thoracentesis of 650 cc on post-operative day 3.  She was admitted to [facility] on 07/01/2015 and 07/07/2015 for recurrent shortness of breath.  She underwent 2 additional thoracenteses (1.1 L on 07/02/2015 and 850 cc on 07/08/2015).  Cytology was negative x 2.  Bilateral lower extremity duplex on 07/03/2015 was negative.  Echo revealed an EF of 55-60% on 07/08/2015 and 60-65% on 05/27/2016.    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 She was on Lovenox 60 mg twice a day.  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  She has severe rheumatoid arthritis.  Methotrexate and Enbrel were initially on hold.  She has a normocytic anemia.  Work-up on 02/17/2015 and 05/24/2016 revealed a normal ferritin, B12, folate, TSH.  She denies any melena or hematochezia.    She has anemia due to chronic disease. She received 1 unit PRBCs during her admission at [facility]. She denies any melena or hematochezia. She has diabetes and is on an insulin pump.  She has had persistent neutropenia felt secondary to her rheumatoid arthritis.  Folate and MMA were normal.  TSH was 6.13 (high) with a free T4 of 1.17 (0.61-1.12).  She began methotrexate (10 mg a week) and prednisone (5 mg a day) for severe rheumatoid arthritis on 12/17/2015.  She remains on prednisone alone (5 mg a day).  Bone marrow aspirate and biopsy on 06/09/2010 revealed a hypercellular marrow (70%) with no evidence of dysplasia or malignancy.  Flow cytometry was negative.  Cytogenetics  were normal (46,XX).  FISH studies were negative for MDS.  Bone marrow aspirate and biopsy on 12/03/2015 revealed a normocellular to mildy hypercellular marrow for age (40%) with left shifted myelopoiesis, non specific dyserythropoiesis and mild megakaryocytic atypia with no increase in blasts.  There were multiple small nonspecific lymphoid aggregates (favor reactive). There was no increase in reticulin.  There was decreased myeloid cells (37%) with left shifted maturation and 1% atypical myelod blasts.  There was relatively increased monocytic cells (11%), relatively increased lymphoid cells (36%), and relatively increased eosinophils (6%).  Cytogenetics were normal (45, XX).  SNP microarray was normal.  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  She receives IV magnesium weekly.  She has chemotherapy induced anemia.  She has received Procrit (last 11/07/2016).  Labs on 08/22/2016 revealed a normal B12 and folate.  Ferritin was 37, iron saturation 11% and TIBC 281.  Code status is DNR/DNI.  She developed acute LFT elevation on 11/28/2016.  Event was preceded by upper abdominal discomfort.  Hepatitis B core antibody total and hepatitis C antibody were negative.  Symptomatically, she reports fatigue.  Exam is stable.  She has chemotherapy induced anemia. WBC 1.7 with an ANC of 400. Hemoglobin 9.6, hematocrit 28.3, and platelets 220,000.  Plan: 1.  Labs today:  CBC with diff, CMP, Mg, CA125. 2.  Discuss interval neutropenia likely secondary to patient's baseline counts and neutropenia associated with Keytruda (24% incidence; grade 3/4 7%).  Discuss use of GCSF or Neulasta. 3.  Review MRI of abdomen results today.  4.  No pembrolizumab today. 5.  GCSF injection today and tomorrow. 6.  Magnesium 4 gram IV  today.  7.  Review fever and neutropenia precautions. 8.  RTC on 12/22/2016 for labs (CBC with diff) and +/- GCSF 9.  RTC on 12/26/2016 for MD assessment, labs (CBC with diff, CMP,  Mg), and cycle #2 pembrolizumab.   Melissa C. Mike Gip, MD  12/20/2016, 10:10 AM

## 2016-12-21 ENCOUNTER — Inpatient Hospital Stay: Payer: Medicare Other

## 2016-12-21 DIAGNOSIS — C569 Malignant neoplasm of unspecified ovary: Secondary | ICD-10-CM | POA: Diagnosis not present

## 2016-12-21 LAB — CA 125: Cancer Antigen (CA) 125: 91 U/mL — ABNORMAL HIGH (ref 0.0–38.1)

## 2016-12-21 MED ORDER — TBO-FILGRASTIM 300 MCG/0.5ML ~~LOC~~ SOSY
300.0000 ug | PREFILLED_SYRINGE | Freq: Once | SUBCUTANEOUS | Status: AC
  Administered 2016-12-21: 300 ug via SUBCUTANEOUS

## 2016-12-22 ENCOUNTER — Ambulatory Visit: Payer: Medicare Other

## 2016-12-22 ENCOUNTER — Inpatient Hospital Stay: Payer: Medicare Other

## 2016-12-22 ENCOUNTER — Encounter: Payer: Self-pay | Admitting: Hematology and Oncology

## 2016-12-22 ENCOUNTER — Other Ambulatory Visit: Payer: Medicare Other

## 2016-12-22 DIAGNOSIS — C569 Malignant neoplasm of unspecified ovary: Secondary | ICD-10-CM | POA: Diagnosis not present

## 2016-12-22 DIAGNOSIS — C786 Secondary malignant neoplasm of retroperitoneum and peritoneum: Secondary | ICD-10-CM

## 2016-12-22 DIAGNOSIS — C801 Malignant (primary) neoplasm, unspecified: Secondary | ICD-10-CM

## 2016-12-22 DIAGNOSIS — D708 Other neutropenia: Secondary | ICD-10-CM

## 2016-12-22 LAB — CBC WITH DIFFERENTIAL/PLATELET
Basophils Absolute: 0 10*3/uL (ref 0–0.1)
Basophils Relative: 1 %
Eosinophils Absolute: 0 10*3/uL (ref 0–0.7)
Eosinophils Relative: 0 %
HCT: 30.7 % — ABNORMAL LOW (ref 35.0–47.0)
Hemoglobin: 10.7 g/dL — ABNORMAL LOW (ref 12.0–16.0)
Lymphocytes Relative: 25 %
Lymphs Abs: 1 10*3/uL (ref 1.0–3.6)
MCH: 29.8 pg (ref 26.0–34.0)
MCHC: 34.9 g/dL (ref 32.0–36.0)
MCV: 85.4 fL (ref 80.0–100.0)
Monocytes Absolute: 0.8 10*3/uL (ref 0.2–0.9)
Monocytes Relative: 21 %
Neutro Abs: 2.1 10*3/uL (ref 1.4–6.5)
Neutrophils Relative %: 53 %
Platelets: 220 10*3/uL (ref 150–440)
RBC: 3.59 MIL/uL — ABNORMAL LOW (ref 3.80–5.20)
RDW: 16.1 % — ABNORMAL HIGH (ref 11.5–14.5)
Smear Review: ADEQUATE
WBC: 3.9 10*3/uL (ref 3.6–11.0)

## 2016-12-22 MED ORDER — TBO-FILGRASTIM 300 MCG/0.5ML ~~LOC~~ SOSY
300.0000 ug | PREFILLED_SYRINGE | Freq: Once | SUBCUTANEOUS | Status: AC
  Administered 2016-12-22: 300 ug via SUBCUTANEOUS
  Filled 2016-12-22: qty 0.5

## 2016-12-22 NOTE — Progress Notes (Signed)
Proceed with Granix per Dr. Mike Gip.

## 2016-12-26 ENCOUNTER — Encounter: Payer: Self-pay | Admitting: Hematology and Oncology

## 2016-12-26 ENCOUNTER — Inpatient Hospital Stay: Payer: Medicare Other

## 2016-12-26 ENCOUNTER — Inpatient Hospital Stay (HOSPITAL_BASED_OUTPATIENT_CLINIC_OR_DEPARTMENT_OTHER): Payer: Medicare Other | Admitting: Hematology and Oncology

## 2016-12-26 VITALS — BP 108/57 | HR 76 | Temp 97.4°F | Resp 18 | Wt 134.1 lb

## 2016-12-26 DIAGNOSIS — C786 Secondary malignant neoplasm of retroperitoneum and peritoneum: Secondary | ICD-10-CM

## 2016-12-26 DIAGNOSIS — C569 Malignant neoplasm of unspecified ovary: Secondary | ICD-10-CM | POA: Diagnosis not present

## 2016-12-26 DIAGNOSIS — Z7689 Persons encountering health services in other specified circumstances: Secondary | ICD-10-CM

## 2016-12-26 DIAGNOSIS — I252 Old myocardial infarction: Secondary | ICD-10-CM

## 2016-12-26 DIAGNOSIS — E109 Type 1 diabetes mellitus without complications: Secondary | ICD-10-CM

## 2016-12-26 DIAGNOSIS — C801 Malignant (primary) neoplasm, unspecified: Secondary | ICD-10-CM

## 2016-12-26 DIAGNOSIS — I2510 Atherosclerotic heart disease of native coronary artery without angina pectoris: Secondary | ICD-10-CM

## 2016-12-26 DIAGNOSIS — D6481 Anemia due to antineoplastic chemotherapy: Secondary | ICD-10-CM

## 2016-12-26 DIAGNOSIS — M069 Rheumatoid arthritis, unspecified: Secondary | ICD-10-CM | POA: Diagnosis not present

## 2016-12-26 DIAGNOSIS — Z87891 Personal history of nicotine dependence: Secondary | ICD-10-CM

## 2016-12-26 DIAGNOSIS — R5383 Other fatigue: Secondary | ICD-10-CM | POA: Diagnosis not present

## 2016-12-26 DIAGNOSIS — Z5112 Encounter for antineoplastic immunotherapy: Secondary | ICD-10-CM | POA: Insufficient documentation

## 2016-12-26 DIAGNOSIS — K219 Gastro-esophageal reflux disease without esophagitis: Secondary | ICD-10-CM

## 2016-12-26 DIAGNOSIS — E785 Hyperlipidemia, unspecified: Secondary | ICD-10-CM

## 2016-12-26 DIAGNOSIS — C782 Secondary malignant neoplasm of pleura: Secondary | ICD-10-CM | POA: Diagnosis not present

## 2016-12-26 DIAGNOSIS — D708 Other neutropenia: Secondary | ICD-10-CM

## 2016-12-26 DIAGNOSIS — N1330 Unspecified hydronephrosis: Secondary | ICD-10-CM

## 2016-12-26 DIAGNOSIS — Z794 Long term (current) use of insulin: Secondary | ICD-10-CM

## 2016-12-26 DIAGNOSIS — K449 Diaphragmatic hernia without obstruction or gangrene: Secondary | ICD-10-CM

## 2016-12-26 DIAGNOSIS — I5032 Chronic diastolic (congestive) heart failure: Secondary | ICD-10-CM | POA: Diagnosis not present

## 2016-12-26 DIAGNOSIS — D638 Anemia in other chronic diseases classified elsewhere: Secondary | ICD-10-CM | POA: Diagnosis not present

## 2016-12-26 DIAGNOSIS — I10 Essential (primary) hypertension: Secondary | ICD-10-CM

## 2016-12-26 DIAGNOSIS — Z86718 Personal history of other venous thrombosis and embolism: Secondary | ICD-10-CM

## 2016-12-26 DIAGNOSIS — Z808 Family history of malignant neoplasm of other organs or systems: Secondary | ICD-10-CM

## 2016-12-26 DIAGNOSIS — Z66 Do not resuscitate: Secondary | ICD-10-CM

## 2016-12-26 DIAGNOSIS — E876 Hypokalemia: Secondary | ICD-10-CM

## 2016-12-26 DIAGNOSIS — Z7189 Other specified counseling: Secondary | ICD-10-CM

## 2016-12-26 LAB — COMPREHENSIVE METABOLIC PANEL
ALT: 10 U/L — ABNORMAL LOW (ref 14–54)
AST: 13 U/L — ABNORMAL LOW (ref 15–41)
Albumin: 3.1 g/dL — ABNORMAL LOW (ref 3.5–5.0)
Alkaline Phosphatase: 72 U/L (ref 38–126)
Anion gap: 5 (ref 5–15)
BUN: 22 mg/dL — ABNORMAL HIGH (ref 6–20)
CO2: 31 mmol/L (ref 22–32)
Calcium: 9.1 mg/dL (ref 8.9–10.3)
Chloride: 95 mmol/L — ABNORMAL LOW (ref 101–111)
Creatinine, Ser: 0.82 mg/dL (ref 0.44–1.00)
GFR calc Af Amer: >60 mL/min (ref >60)
GFR calc non Af Amer: >60 mL/min (ref >60)
Glucose, Bld: 271 mg/dL — ABNORMAL HIGH (ref 65–99)
Potassium: 3.7 mmol/L (ref 3.5–5.1)
Sodium: 131 mmol/L — ABNORMAL LOW (ref 135–145)
Total Bilirubin: 0.5 mg/dL (ref 0.3–1.2)
Total Protein: 7.6 g/dL (ref 6.5–8.1)

## 2016-12-26 LAB — CBC WITH DIFFERENTIAL/PLATELET
Basophils Absolute: 0 10*3/uL (ref 0–0.1)
Basophils Relative: 1 %
Eosinophils Absolute: 0 10*3/uL (ref 0–0.7)
Eosinophils Relative: 1 %
HCT: 31.1 % — ABNORMAL LOW (ref 35.0–47.0)
Hemoglobin: 10.6 g/dL — ABNORMAL LOW (ref 12.0–16.0)
Lymphocytes Relative: 33 %
Lymphs Abs: 1.1 10*3/uL (ref 1.0–3.6)
MCH: 29.2 pg (ref 26.0–34.0)
MCHC: 34.2 g/dL (ref 32.0–36.0)
MCV: 85.3 fL (ref 80.0–100.0)
Monocytes Absolute: 0.8 10*3/uL (ref 0.2–0.9)
Monocytes Relative: 22 %
Neutro Abs: 1.5 10*3/uL (ref 1.4–6.5)
Neutrophils Relative %: 43 %
Platelets: 205 10*3/uL (ref 150–440)
RBC: 3.64 MIL/uL — ABNORMAL LOW (ref 3.80–5.20)
RDW: 16.4 % — ABNORMAL HIGH (ref 11.5–14.5)
WBC: 3.4 10*3/uL — ABNORMAL LOW (ref 3.6–11.0)

## 2016-12-26 LAB — MAGNESIUM: Magnesium: 1.3 mg/dL — ABNORMAL LOW (ref 1.7–2.4)

## 2016-12-26 MED ORDER — SODIUM CHLORIDE 0.9 % IV SOLN
Freq: Once | INTRAVENOUS | Status: AC
  Administered 2016-12-26: 11:00:00 via INTRAVENOUS
  Filled 2016-12-26: qty 1000

## 2016-12-26 MED ORDER — HEPARIN SOD (PORK) LOCK FLUSH 100 UNIT/ML IV SOLN
500.0000 [IU] | Freq: Once | INTRAVENOUS | Status: AC
  Administered 2016-12-26: 500 [IU] via INTRAVENOUS
  Filled 2016-12-26: qty 5

## 2016-12-26 MED ORDER — SODIUM CHLORIDE 0.9 % IV SOLN
200.0000 mg | Freq: Once | INTRAVENOUS | Status: AC
  Administered 2016-12-26: 200 mg via INTRAVENOUS
  Filled 2016-12-26: qty 8

## 2016-12-26 MED ORDER — MAGNESIUM SULFATE 4 GM/100ML IV SOLN
4.0000 g | Freq: Once | INTRAVENOUS | Status: AC
Start: 2016-12-26 — End: 2016-12-26
  Administered 2016-12-26: 4 g via INTRAVENOUS
  Filled 2016-12-26: qty 100

## 2016-12-26 MED ORDER — SODIUM CHLORIDE 0.9% FLUSH
10.0000 mL | INTRAVENOUS | Status: DC | PRN
  Administered 2016-12-26: 10 mL via INTRAVENOUS
  Filled 2016-12-26: qty 10

## 2016-12-26 NOTE — Progress Notes (Signed)
Patient offers no complaints today. 

## 2016-12-26 NOTE — Progress Notes (Signed)
[facility] day:  12/26/2016   Chief Complaint: [name] is a 71 y.o. female with stage IV ovarian cancer who is seen for assessment prior to cycle #2 pembrolizumab.  HPI:  The patient was last seen in the medical oncology clinic on 12/20/2016.  At that time, she had polyarthralgia.  Prednisone had been increased to 10 mg a day.  She was fatigued.  Cycle #2 pembrolizumab was held secondary to neutropenia (ANC 400).  She received GCSF (Granix) x 3 days.  During the interim, patient feels well. States, "I feel the best today that I have felt in 3 weeks". She denies physical complaints today. She reports joint aches following GCSF injections; lasted 3 days.    Past Medical History:  Diagnosis Date  . CAD (coronary artery disease)    a. 09/2012 Cath: LM nl, LAD 95p, 73m LCX 957mOM2 50, RCA 100.  . Marland Kitchenholelithiasis   . Chronic diastolic CHF (congestive heart failure) ([facility] [place]  . Collagen vascular disease (HCC)    Rheumatoid Arthritis.  . Esophageal stricture   . Exertional shortness of breath   . GERD (gastroesophageal reflux disease)   . H/O hiatal hernia   . Herniated disc   . History of pancytopenia   . Hyperlipidemia   . Hypertension   . Hypokalemia   . Leukopenia 2012   s/p bone marrow biopsy, Dr. PaMa Hillock. NSTEMI (non-ST elevated myocardial infarction) ([facility]   "mild" (10/18/2012)  . Ovarian cancer ([facility]2016   chemo and hysterectomy  . Pneumonia 2013; 08/2012   "one lung; double" (10/18/2012)  . Rheumatoid arthritis(714.0)   . Type I diabetes mellitus ([facility]   "dx'd in 1957" (10/18/2012)    Past Surgical History:  Procedure Laterality Date  . ABDOMINAL HYSTERECTOMY  06/15/2015   Dx L/S, EXLAP TAH BSO omentectomy RSRx colostomy diaphragm resection stripping  . CARDIAC CATHETERIZATION  10/18/2012   "first one was today" (10/18/2012)  . CATARACT EXTRACTION W/ INTRAOCULAR LENS IMPLANT Right 2010  . CHOLECYSTECTOMY  06/15/2015    combined case with ovarian cancer debulking  . COLON SURGERY    . CORONARY ARTERY BYPASS GRAFT N/A 10/19/2012   Procedure: CORONARY ARTERY BYPASS GRAFTING (CABG);  Surgeon: StMelrose NakayamaMD;  Location: [facility] Service: Open Heart Surgery;  Laterality: N/A;  . ESOPHAGEAL DILATION     "3 or 4 times" (10/19/2011)  . ESOPHAGOGASTRODUODENOSCOPY  2012   Dr. IfMinna Merritts. OSTOMY    . OVARY SURGERY     removal  . PERIPHERAL VASCULAR CATHETERIZATION N/A 03/02/2015   Procedure: PoGlori Luisath Insertion;  Surgeon: JaAlgernon HuxleyMD;  [facility];  Service: Cardiovascular;  Laterality: N/A;  . TUBAL LIGATION  1970    Family History  Problem Relation Age of Onset  . Diabetes Mother   . Arthritis Mother   . Diabetes Father   . Arthritis Father   . Bone cancer Sister     Social History:  reports that she quit smoking about 22 years ago. Her smoking use included Cigarettes. She has a 30.00 pack-year smoking history. She has never used smokeless tobacco. She reports that she does not drink alcohol or use drugs.  She is accompanied by her husband today.  Allergies:  Allergies  Allergen Reactions  . Codeine Nausea And Vomiting  . Latex Rash    Current Medications: Current Outpatient Prescriptions  Medication Sig Dispense Refill  .  Calcium-Vitamin D 600-200 MG-UNIT tablet Take 1 tablet by mouth 2 (two) times daily.     . cholecalciferol (VITAMIN D) 1000 units tablet Take 1,000 Units by mouth daily.    Marland Kitchen enoxaparin (LOVENOX) 60 MG/0.6ML injection INJECT 0.6 ML (60 MG TOTAL) INTO THE SKIN EVERY 12 HOURS 60 mL 2  . folic acid (FOLVITE) 1 MG tablet Take 1 tablet (1 mg total) by mouth daily. 90 tablet 3  . furosemide (LASIX) 80 MG tablet Take 0.5 tablets by mouth 2 (two) times daily.    . insulin lispro (HUMALOG) 100 UNIT/ML injection Inject 0.06 mLs (6 Units total) into the skin daily. (Patient taking differently: Inject 0-85 Units into the skin daily. Via insulin pump) 30 mL 3   . lidocaine-prilocaine (EMLA) cream APPLY 1 APPLICATION TOPICALLY AS NEEDED 1 HOUR PRIOR TO TREATMENT. COVER WITH PRESS N SEAL UNTIL TREATMENT TIME AS DIRECTED 30 g 1  . loratadine (CLARITIN) 10 MG tablet Take 1 tablet by mouth daily as needed.    . magnesium oxide (MAG-OX) 400 (241.3 Mg) MG tablet TAKE 1 TABLET BY MOUTH ONCE DAILY 30 tablet 1  . metoprolol tartrate (LOPRESSOR) 25 MG tablet Take 1 tablet (25 mg total) by mouth 2 (two) times daily. 180 tablet 3  . ondansetron (ZOFRAN) 8 MG tablet TAKE 1 TABLET BY MOUTH EVERY 8 HOURS AS NEEDED FOR NAUSEA OR VOMITING 30 tablet 1  . pantoprazole (PROTONIX) 40 MG tablet TAKE 1 TABLET BY MOUTH TWICE (2) DAILY 180 tablet 1  . Pembrolizumab (KEYTRUDA IV) Inject into the vein every 14 (fourteen) days.    . potassium chloride SA (K-DUR,KLOR-CON) 20 MEQ tablet TAKE 1 TABLET BY MOUTH DAILY 30 tablet 2  . predniSONE (DELTASONE) 5 MG tablet Take 5 mg by mouth daily with breakfast.    . vitamin C (ASCORBIC ACID) 500 MG tablet Take 500 mg by mouth daily.    Marland Kitchen zinc gluconate 50 MG tablet Take 50 mg by mouth daily.     Current Facility-Administered Medications  Medication Dose Route Frequency Provider Last Rate Last Dose  . Tbo-Filgrastim (GRANIX) injection 300 mcg  300 mcg Subcutaneous Once Lequita Asal, MD       Facility-Administered Medications Ordered in Other Visits  Medication Dose Route Frequency Provider Last Rate Last Dose  . heparin lock flush 100 unit/mL  500 Units Intravenous Once Corcoran, Melissa C, MD      . sodium chloride 0.9 % injection 10 mL  10 mL Intracatheter PRN Corcoran, Melissa C, MD      . sodium chloride 0.9 % injection 10 mL  10 mL Intravenous PRN Lequita Asal, MD   10 mL at 04/13/15 0852  . sodium chloride flush (NS) 0.9 % injection 10 mL  10 mL Intravenous PRN Lequita Asal, MD   10 mL at 12/26/16 0932    Review of Systems:  GENERAL:  "Best that T have felt in 3 weeks".  No fever, chills or sweats.  Weight  down 2 pounds. PERFORMANCE STATUS (ECOG):  1 HEENT:  No visual changes, sore throat, mouth sores or tenderness. Lungs:  No shortness of breath.  No cough.  No hemoptysis. Cardiac:  No chest pain, palpitations, orthopnea, or PND. GI:  No nausea, vomiting, diarrhea, melena or hematochezia. GU:  No urgency, frequency, dysuria, or hematuria. Musculoskeletal:  Severe rheumatoid arthritis (on prednisone).  Arthralgias worse after treatment.  Osteoporosis.  No back pain. No muscle tenderness. Extremities:  No pain or swelling. Skin:  No increased bruising or bleeding.  No rashes or skin changes. Neuro:  Neuropathy (stable).  No headache, numbness or weakness, balance or coordination issues. Endocrine:  Diabetes on an insulin pump.  Blood sugar elevated on steroids.  Occasional hot flashes.  Psych:  No mood changes, depression or anxiety. Pain: No pain. Review of systems:  All other systems reviewed and found to be negative.  Physical Exam:  Blood pressure (!) 108/57, pulse 76, temperature (!) 97.4 F (36.3 C), temperature source Tympanic, resp. rate 18, weight 134 lb 1 oz (60.8 kg). GENERAL:  Well developed, well nourished woman sitting comfortably in the exam room in no acute distress. MENTAL STATUS:  Alert and oriented to person, place and time. HEAD:  Alopecia.  Normocephalic, atraumatic, face symmetric, no Cushingoid features. EYES:  Gold rimmed glasses.  Blue eyes.  No conjunctivitis or scleral icterus.  ENT: Oropharynx clear without lesion. Tongue normal. Mucous membranes moist.  RESPIRATORY: Clear to auscultation without rales, wheezes or rhonchi. CARDIOVASCULAR: Regular rate and rhythm without murmur, rub or gallop. ABDOMEN: Soft, , non-tender with active bowel sounds and no hepatosplenomegaly. No palpable nodularity or masses.  Insulin pump.  Ostomy bag. SKIN: No rashes or ulcers. EXTREMITIES:  No edema, skin discoloration or tenderness. No palpable cords. LYMPH NODES: No  palpable cervical, supraclavicular, axillary or inguinal adenopathy  NEUROLOGICAL: Appropriate. PSYCH: Appropriate.  Radiology studies: 02/13/2015:  Abdomen and pelvic CT revealed bilateral mass-like adnexal regions (right adnexal mass 5.6 x 5.0 cm and the left adnexa mass 10.3 x 6.0 x 9.9 cm).  There was a large amount of soft tissue throughout the peritoneal cavity involving the omentum and other peritoneal surfaces.  There was a small volume ascites. There was a peripheral 3.3 x 1.9 cm low-attenuation lesion overlying the right lobe of the liver, likely representing a serosal implant. There was 1.4 x 2.2 cm ill-defined peripheral lesion within the inferior aspect of segment 6 adjacent to the ampulla in the duodenum.  04/28/2015:  Abdominal and pelvic CT revealed decreasing bilateral ovarian masses.   The left adnexal mass measured 4.0 x 6.1 cm (previously 5.0 x 8.5 cm). The right ovary measured 3.8 x 4.9 cm (previously 4.7 x 5.6 cm).  There was improved peritoneal carcinomatosis.  There was a small amount of ascites.  The hepatic dome lesion was stable. The previously seen right hepatic lobe lesion was not well-visualized.  There was a small left pleural effusion and trace right pleural effusion.  The nodular lesion at the ampulla of Vater, extending into the duodenum was stable.  There was no biliary ductal dilatation. 08/01/2015:  Rght upper extremity ultrasound revealed a near occlusive thrombus within the central portion of the right internal jugular vein and central portion of the right subclavian vein. 09/01/2015:  Chest, abdomen, and pelvic CT revealed continued decrease in perihepatic fluid collection contiguous with the right pleural space with percutaneous drain.  09/11/2015:  Chest, abdomen, and pelvic CT revealed a residual versus recurrent fluid collection posterior to the right hepatic lobe (2.6 x 1.1 x 4.0 cm).   10/13/2015:  Chest, abdomen, and pelvic CT revealed resolution of the  empyema. 02/15/2016:  Chest, abdomen, and pelvic CT revealed multiple soft tissue nodules throughout the pelvis, small bowel mesenteric and peritoneum and, concerning for peritoneal metastatic disease.  There was soft tissue irregularity within the left upper quadrant.  There was mild right hydronephrosis (etiology unclear).  There was interval resolution of previously described right pleural based fluid and gas collection. There  was a pleural based nodule within the right lower hemi-thorax concerning for pleural based metastasis.  There were small bilateral pleural effusions.  There was pulmonary nodularity, predominately within the left upper lobe (metastatic or infectious/inflammatory etiology).  There was slightly increased mediastinal adenopathy (infectious/inflammatory or metastatic).  There was a low attenuation lesion within the left hepatic lobe (complicated fluid within the fissure or metastatic disease). 03/08/2016:  Abdomen and pelvic CT revealed mild progression of peritoneal disease in the abdomen.  There was interval increase in loculated fluid around the lateral segment left liver, stomach, and spleen.  There was persistent soft tissue lesion at the level of the ampulla with mild intra and extrahepatic biliary duct dilatation.  There was persistent intrahepatic and capsular metastatic disease involving the liver. 05/26/2016:  Head MRI revealed multiple small areas of acute infarct involving the occipital parietal lobe bilaterally and the left lateral cerebellum,  consistent with posterior circulation emboli.  06/10/2016:  Adomen and pelvic CT revealed progression of peritoneal carcinomatosis predominantly in the perihepatic space, bilateral lower quadrants and pelvis.  There was worsening bilateral obstructive uropathy due to malignant involvement of the pelvic ureters.  There was stable small pleural/subpleural nodules at the right lung base.  There was stable small left pleural effusion.  There  was decreased small volume perihepatic ascites.  08/09/2016:  Abdomen and pelvic CT revealed interval increase and loculated fluid collections within the peritoneal space consistent with progression of peritoneal ovarian carcinoma metastasis.  There was one large 7 cm collection in the RIGHT lower quadrant which had increased significantly in size and may represent of point of small bowel obstruction.  The proximal stomach and duodenum were decompressed. The mid small bowel was mildly dilated. There was concern for obstruction given the large intraperitoneal fluid collection.  There was increase in subcapsular hepatic metastatic fluid collections.  There was enlargement of rounded ampullary lesion in the second portion duodenum concerning for metastatic lesion.  There was mild biliary duct dilatation (stable).  There was nodular pleural metastasis in the RIGHT lower lobe pleural space. 11/03/2016:  Abdomen and pelvic CT revealed multifocal cystic metastases in the abdomen/pelvis, reflecting peritoneal disease.  The dominant lesion in the left anterior abdomen has progressed, while additional lesions were mixed.  Peritoneal disease along the liver and spleen progressed.  Pleural-based metastases along the posterior right hemithorax progressed. There were trace left pleural effusion.  There was a stable 1.7 cm duodenal lesion at the ampulla. 11/29/2016:  Abdomen and pelvic CT revealed mild interval enlargement of pleural metastasis the RIGHT lower lobe.  There was overall mild decrease in cystic peritoneal metastasis within the abdomen pelvis. The solid lesion at the terminal ileum was decreased in size. Subcapsular lesion in the liver was slightly decreased in size.  There was no evidence of disease progression the abdomen pelvis.  There was mild intrahepatic duct dilatation similar prior.  The ampullary lesion was slightly larger.  There was mild hydronephrosis and hydroureter on the LEFT likely related to cystic  metastasis at the LEFT vesicoureteral junction 12/09/2016:  MRCP revealed the cystic lesion within the pancreatic head/uncinate process had decreased in size and demonstrated no suspicious characteristics. This can be presumed a pseudocyst or focus of side branch duct ectasia.  There was similar to slight progression of extensive peritoneal metastasis since 11/29/2016. Grossly similar abdominal nodal and right pleural metastasis.  There was chronic mild common duct dilatation with similar soft tissue fullness about the ampulla compared to 11/29/2016.  There was improved  to resolved left-sided hydronephrosis.  Admissions: [place] from 06/15/2015 - 06/22/2015.  She underwent exploratory laparotomy, lysis of adhesions, total abdominal hysterectomy with bilateral salpingo-oophorectomy, infracolic omentectomy, optimal tumor debulking(< 1 cm), recto-sigmoid resection with creation of end colostomy, cholecystectomy, mobilization of splenic flexure and liver with diaphragmatic stripping on 06/15/2015. The right diaphragm was cleared of tumor. During dissection, the diaphragm was entered and closed with sutures.  [place] from 07/01/2015 - 07/03/2015 with pleural effusion. [facility] from 07/07/2015 - 07/09/2015 with recurrent pleural effusion. [place] from 07/29/2015 - 08/10/2015 with a right-sided empyema and liver abscess. She underwent CT-guided placement of a liver abscess drain on 07/30/2015.  Liver abscess culture grew out group B strep and Enterobacter which was sensitive to Zosyn. She was transitioned to ertapenem Colbert Ewing) prior to discharge.  She was readmitted to [facility] on 09/17/2015. 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  Head MRI on 05/26/2016 revealed multiple small areas of acute infarct involving the occipital parietal lobe bilaterally and the left lateral cerebellum,  consistent with posterior circulation emboli.  Work-up included a negative carotid ultrasound and  echocardiogram. [facility] from 08/10/2016 - 08/14/2016 with a small bowel obstruction.  She was managed conservatively. [place] from 09/27/2016 - 09/28/2016 with symptomatic anemia and diarrhea.   Infusion on 12/26/2016  Component Date Value Ref Range Status  . Sodium 12/26/2016 131* 135 - 145 mmol/L Final  . Potassium 12/26/2016 3.7  3.5 - 5.1 mmol/L Final  . Chloride 12/26/2016 95* 101 - 111 mmol/L Final  . CO2 12/26/2016 31  22 - 32 mmol/L Final  . Glucose, Bld 12/26/2016 271* 65 - 99 mg/dL Final  . BUN 12/26/2016 22* 6 - 20 mg/dL Final  . Creatinine, Ser 12/26/2016 0.82  0.44 - 1.00 mg/dL Final  . Calcium 12/26/2016 9.1  8.9 - 10.3 mg/dL Final  . Total Protein 12/26/2016 7.6  6.5 - 8.1 g/dL Final  . Albumin 12/26/2016 3.1* 3.5 - 5.0 g/dL Final  . AST 12/26/2016 13* 15 - 41 U/L Final  . ALT 12/26/2016 10* 14 - 54 U/L Final  . Alkaline Phosphatase 12/26/2016 72  38 - 126 U/L Final  . Total Bilirubin 12/26/2016 0.5  0.3 - 1.2 mg/dL Final  . GFR calc non Af Amer 12/26/2016 >60  >60 mL/min Final  . GFR calc Af Amer 12/26/2016 >60  >60 mL/min Final   Comment: (NOTE) The eGFR has been calculated using the CKD EPI equation. This calculation has not been validated in all clinical situations. eGFR's persistently <60 mL/min signify possible Chronic Kidney Disease.   . Anion gap 12/26/2016 5  5 - 15 Final  . WBC 12/26/2016 3.4* 3.6 - 11.0 K/uL Final  . RBC 12/26/2016 3.64* 3.80 - 5.20 MIL/uL Final  . Hemoglobin 12/26/2016 10.6* 12.0 - 16.0 g/dL Final  . HCT 12/26/2016 31.1* 35.0 - 47.0 % Final  . MCV 12/26/2016 85.3  80.0 - 100.0 fL Final  . MCH 12/26/2016 29.2  26.0 - 34.0 pg Final  . MCHC 12/26/2016 34.2  32.0 - 36.0 g/dL Final  . RDW 12/26/2016 16.4* 11.5 - 14.5 % Final  . Platelets 12/26/2016 205  150 - 440 K/uL Final  . Neutrophils Relative % 12/26/2016 43  % Final  . Neutro Abs 12/26/2016 1.5  1.4 - 6.5 K/uL Final  . Lymphocytes Relative 12/26/2016 33  % Final  . Lymphs Abs 12/26/2016  1.1  1.0 - 3.6 K/uL Final  . Monocytes Relative 12/26/2016 22  %  Final  . Monocytes Absolute 12/26/2016 0.8  0.2 - 0.9 K/uL Final  . Eosinophils Relative 12/26/2016 1  % Final  . Eosinophils Absolute 12/26/2016 0.0  0 - 0.7 K/uL Final  . Basophils Relative 12/26/2016 1  % Final  . Basophils Absolute 12/26/2016 0.0  0 - 0.1 K/uL Final  . Magnesium 12/26/2016 1.3* 1.7 - 2.4 mg/dL Final    Assessment:  [name] is a 71 y.o. female with progressive stage IV ovarian cancer.  She presented with abdominal discomfort and bloating.  Omental biopsy on 02/23/2015 revealed metastatic high grade serous carcinoma, consistent with gynecologic origin.   She was initially diagnosed with clinical stage IIIC (T3cN1Mx).  CA125 was 707 on 02/17/2015.  She received 4 cycles of neoadjuvant carboplatin and Taxol (03/05/2015 - 05/22/2015).  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  She had loose stools on oral magnesium.  She was initially on Neurontin then switched Lyrica with cycle #3.  Cycle #4 was notable for neutropenia (ANC 300) requiring GCSF x 3 days.    She underwent exploratory laparotomy, lysis of adhesions, total abdominal hysterectomy with bilateral salpingo-oophorectomy, infracolic omentectomy, optimal tumor debulking(< 1 cm), recto-sigmoid resection with creation of end colostomy, cholecystectomy, mobilization of splenic flexure and liver with diaphragmatic stripping on 06/15/2015. The right diaphragm was cleared of tumor. During dissection, the diaphragm was entered and closed with sutures.  She received tamoxifen from 02/25/2016 - 03/14/2016.  She received 4 cycles of carboplatin and gemcitabine (03/21/2016 - 05/24/2016) with GCSF/Neulasta support.  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  PDL-1 testing revealed a combined score was 5 (>=1 positive).     She received 2 cycles of Doxil (06/21/2016 - 07/19/2016) with Neulasta support.  She received 4 cycles of topotecan  with Neulasta support (08/22/2016 - 10/10/2016).    She is s/p 1 cycle of pembrolizumab (Keytruda) on 12/06/2016.  Cycle #1 was complicated by neutropenia (ANC 400) requiring GCSF/Granix x 3 days.  Abdomen and pelvic CT on 11/29/2016 revealed mild interval enlargement of pleural metastasis the RIGHT lower lobe.  There was overall mild decrease in cystic peritoneal metastasis within the abdomen pelvis. The solid lesion at the terminal ileum was decreased in size. Subcapsular lesion in the liver was slightly decreased in size.  There was no evidence of disease progression the abdomen pelvis.  There was mild intrahepatic duct dilatation similar prior.  The ampullary lesion was slightly larger.  There was mild hydronephrosis and hydroureter on the LEFT likely related to cystic metastasis at the LEFT vesicoureteral junction  CA125 was 802.9 on 03/30/2015, 567.9 on 04/13/2015, 168.8 on 05/15/2015, 85.2 on 07/17/2015, 68.6 on 07/28/2015, 34.5 on 09/17/2015, 20.7 on 11/06/2015, 49 on 01/22/2016, 106.1 on 02/22/2016, 138.8 on 03/07/2016, 220.8 on 03/21/2016, 152.8 on 04/11/2016, 125.6 on 04/18/2016, 76.8 on 05/03/2016, 60.2 on 05/24/2016, 44 on 07/19/2016, 65.8 on 08/17/2016, 53.4 on 09/12/2016, 47.3 on 10/10/2016, and 91 on 12/20/2016.  She has a history of recurrent right sided pleural effusion.  She underwent thoracentesis of 650 cc on post-operative day 3.  She was admitted to [facility] on 07/01/2015 and 07/07/2015 for recurrent shortness of breath.  She underwent 2 additional thoracenteses (1.1 L on 07/02/2015 and 850 cc on 07/08/2015).  Cytology was negative x 2.  Bilateral lower extremity duplex on 07/03/2015 was negative.  Echo revealed an EF of 55-60% on 07/08/2015 and 60-65% on 05/27/2016.    Rght upper extremity ultrasound on 08/01/2015 revealed a near occlusive thrombus within  the central portion of the right internal jugular vein and central portion of the right subclavian vein. She was on Lovenox 60 mg  twice a day.  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  She has severe rheumatoid arthritis.  Methotrexate and Enbrel were initially on hold.  She has a normocytic anemia.  Work-up on 02/17/2015 and 05/24/2016 revealed a normal ferritin, B12, folate, TSH.  She denies any melena or hematochezia.    She has anemia due to chronic disease. She received 1 unit PRBCs during her admission at [facility]. She denies any melena or hematochezia. She has diabetes and is on an insulin pump.  She has had persistent neutropenia felt secondary to her rheumatoid arthritis.  Folate and MMA were normal.  TSH was 6.13 (high) with a free T4 of 1.17 (0.61-1.12).  She began methotrexate (10 mg a week) and prednisone (5 mg a day) for severe rheumatoid arthritis on 12/17/2015.  She remains on prednisone alone (5 mg a day).  Bone marrow aspirate and biopsy on 06/09/2010 revealed a hypercellular marrow (70%) with no evidence of dysplasia or malignancy.  Flow cytometry was negative.  Cytogenetics were normal (46,XX).  FISH studies were negative for MDS.  Bone marrow aspirate and biopsy on 12/03/2015 revealed a normocellular to mildy hypercellular marrow for age (40%) with left shifted myelopoiesis, non specific dyserythropoiesis and mild megakaryocytic atypia with no increase in blasts.  There were multiple small nonspecific lymphoid aggregates (favor reactive). There was no increase in reticulin.  There was decreased myeloid cells (37%) with left shifted maturation and 1% atypical myelod blasts.  There was relatively increased monocytic cells (11%), relatively increased lymphoid cells (36%), and relatively increased eosinophils (6%).  Cytogenetics were normal (59, XX).  SNP microarray was normal.  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  She receives IV magnesium weekly.  She has chemotherapy induced anemia.  She has received Procrit (last 11/07/2016).  Labs on 08/22/2016  revealed a normal B12 and folate.  Ferritin was 37, iron saturation 11% and TIBC 281.  Code status is DNR/DNI.  She developed acute LFT elevation on 11/28/2016.  Event was preceded by upper abdominal discomfort.  Hepatitis B core antibody total and hepatitis C antibody were negative.  Symptomatically, she feels good.  Exam is stable.  She has chemotherapy induced anemia. WBC 3400 with an ANC of 1500. Hemoglobin 10.6, hematocrit 31.1, and platelets 205,000. Magnesium is1.3.  Plan: 1.  Labs today:  CBC with diff, CMP, Mg. 2.  Magnesium 4 gram IV today.  3.  Cycle #2 pembrolizumab today.  4.  Discuss issues with neutropenia associated with pembrolizumab.  Incidence 24% with grade 3/4 7%.  Discuss autoimmune neutropenia treatment with GCSF or Neulasta, IVIG, and steroids.  At baseline, she has poor marrow reserve.  She responded well to GCSF.  Suspect her neutropenia due to her baseline marrow issues +/- pembrolizumab.  Discuss treatment today with GCSF 2 days this week and 2-3 days next week.  Advance dose next cycle with consideration of further GCSF or Neulasta. 5.  Discuss neutropenic precautions. 6.  RTC on 12/29/16 and 12/30/16 for GCSF. 7.  RTC on 01/03/2017 for labs (CBC with diff, BMP, Mg), +/- GCSF +/- Mg 8.  RTC on 01/09/2017 for MD assessment, labs (CBC, CMP, Mg), +/- Mg, and cycle #3 pembrolizumab   Melissa C. Mike Gip, MD  12/26/2016, 10:25 AM

## 2016-12-29 ENCOUNTER — Inpatient Hospital Stay: Payer: Medicare Other

## 2016-12-29 DIAGNOSIS — C569 Malignant neoplasm of unspecified ovary: Secondary | ICD-10-CM | POA: Diagnosis not present

## 2016-12-29 MED ORDER — TBO-FILGRASTIM 300 MCG/0.5ML ~~LOC~~ SOSY
300.0000 ug | PREFILLED_SYRINGE | Freq: Once | SUBCUTANEOUS | Status: AC
  Administered 2016-12-29: 300 ug via SUBCUTANEOUS
  Filled 2016-12-29: qty 0.5

## 2016-12-30 ENCOUNTER — Inpatient Hospital Stay: Payer: Medicare Other

## 2016-12-30 DIAGNOSIS — C569 Malignant neoplasm of unspecified ovary: Secondary | ICD-10-CM | POA: Diagnosis not present

## 2016-12-30 MED ORDER — TBO-FILGRASTIM 300 MCG/0.5ML ~~LOC~~ SOSY
300.0000 ug | PREFILLED_SYRINGE | Freq: Once | SUBCUTANEOUS | Status: AC
  Administered 2016-12-30: 300 ug via SUBCUTANEOUS
  Filled 2016-12-30: qty 0.5

## 2017-01-03 ENCOUNTER — Inpatient Hospital Stay: Payer: Medicare Other

## 2017-01-03 ENCOUNTER — Other Ambulatory Visit: Payer: Self-pay | Admitting: Hematology and Oncology

## 2017-01-03 ENCOUNTER — Other Ambulatory Visit: Payer: Self-pay | Admitting: *Deleted

## 2017-01-03 ENCOUNTER — Inpatient Hospital Stay: Payer: Medicare Other | Attending: Hematology and Oncology

## 2017-01-03 DIAGNOSIS — Z7689 Persons encountering health services in other specified circumstances: Secondary | ICD-10-CM | POA: Insufficient documentation

## 2017-01-03 DIAGNOSIS — I5032 Chronic diastolic (congestive) heart failure: Secondary | ICD-10-CM | POA: Insufficient documentation

## 2017-01-03 DIAGNOSIS — Z87891 Personal history of nicotine dependence: Secondary | ICD-10-CM | POA: Diagnosis not present

## 2017-01-03 DIAGNOSIS — Z79899 Other long term (current) drug therapy: Secondary | ICD-10-CM | POA: Diagnosis not present

## 2017-01-03 DIAGNOSIS — C786 Secondary malignant neoplasm of retroperitoneum and peritoneum: Secondary | ICD-10-CM | POA: Insufficient documentation

## 2017-01-03 DIAGNOSIS — I998 Other disorder of circulatory system: Secondary | ICD-10-CM | POA: Insufficient documentation

## 2017-01-03 DIAGNOSIS — C801 Malignant (primary) neoplasm, unspecified: Secondary | ICD-10-CM

## 2017-01-03 DIAGNOSIS — C569 Malignant neoplasm of unspecified ovary: Secondary | ICD-10-CM | POA: Insufficient documentation

## 2017-01-03 DIAGNOSIS — D6481 Anemia due to antineoplastic chemotherapy: Secondary | ICD-10-CM | POA: Diagnosis not present

## 2017-01-03 DIAGNOSIS — E785 Hyperlipidemia, unspecified: Secondary | ICD-10-CM | POA: Insufficient documentation

## 2017-01-03 DIAGNOSIS — E876 Hypokalemia: Secondary | ICD-10-CM | POA: Diagnosis not present

## 2017-01-03 DIAGNOSIS — D61818 Other pancytopenia: Secondary | ICD-10-CM | POA: Diagnosis not present

## 2017-01-03 DIAGNOSIS — Z86718 Personal history of other venous thrombosis and embolism: Secondary | ICD-10-CM | POA: Insufficient documentation

## 2017-01-03 DIAGNOSIS — K449 Diaphragmatic hernia without obstruction or gangrene: Secondary | ICD-10-CM | POA: Insufficient documentation

## 2017-01-03 DIAGNOSIS — M2550 Pain in unspecified joint: Secondary | ICD-10-CM | POA: Diagnosis not present

## 2017-01-03 DIAGNOSIS — C782 Secondary malignant neoplasm of pleura: Secondary | ICD-10-CM | POA: Insufficient documentation

## 2017-01-03 DIAGNOSIS — M069 Rheumatoid arthritis, unspecified: Secondary | ICD-10-CM | POA: Insufficient documentation

## 2017-01-03 DIAGNOSIS — N1330 Unspecified hydronephrosis: Secondary | ICD-10-CM | POA: Insufficient documentation

## 2017-01-03 DIAGNOSIS — Z87442 Personal history of urinary calculi: Secondary | ICD-10-CM | POA: Insufficient documentation

## 2017-01-03 DIAGNOSIS — E108 Type 1 diabetes mellitus with unspecified complications: Secondary | ICD-10-CM | POA: Insufficient documentation

## 2017-01-03 DIAGNOSIS — Z794 Long term (current) use of insulin: Secondary | ICD-10-CM | POA: Diagnosis not present

## 2017-01-03 DIAGNOSIS — Z5112 Encounter for antineoplastic immunotherapy: Secondary | ICD-10-CM | POA: Insufficient documentation

## 2017-01-03 DIAGNOSIS — Z66 Do not resuscitate: Secondary | ICD-10-CM | POA: Insufficient documentation

## 2017-01-03 DIAGNOSIS — I2510 Atherosclerotic heart disease of native coronary artery without angina pectoris: Secondary | ICD-10-CM | POA: Diagnosis not present

## 2017-01-03 DIAGNOSIS — Z808 Family history of malignant neoplasm of other organs or systems: Secondary | ICD-10-CM | POA: Insufficient documentation

## 2017-01-03 DIAGNOSIS — K219 Gastro-esophageal reflux disease without esophagitis: Secondary | ICD-10-CM | POA: Insufficient documentation

## 2017-01-03 DIAGNOSIS — Z7189 Other specified counseling: Secondary | ICD-10-CM

## 2017-01-03 DIAGNOSIS — I252 Old myocardial infarction: Secondary | ICD-10-CM | POA: Diagnosis not present

## 2017-01-03 LAB — CBC WITH DIFFERENTIAL/PLATELET
Basophils Absolute: 0 10*3/uL (ref 0–0.1)
Basophils Relative: 2 %
Eosinophils Absolute: 0 10*3/uL (ref 0–0.7)
Eosinophils Relative: 1 %
HCT: 28.7 % — ABNORMAL LOW (ref 35.0–47.0)
Hemoglobin: 9.9 g/dL — ABNORMAL LOW (ref 12.0–16.0)
Lymphocytes Relative: 35 %
Lymphs Abs: 0.9 10*3/uL — ABNORMAL LOW (ref 1.0–3.6)
MCH: 29.2 pg (ref 26.0–34.0)
MCHC: 34.4 g/dL (ref 32.0–36.0)
MCV: 85 fL (ref 80.0–100.0)
Monocytes Absolute: 0.5 10*3/uL (ref 0.2–0.9)
Monocytes Relative: 22 %
Neutro Abs: 1 10*3/uL — ABNORMAL LOW (ref 1.4–6.5)
Neutrophils Relative %: 40 %
Platelets: 210 10*3/uL (ref 150–440)
RBC: 3.37 MIL/uL — ABNORMAL LOW (ref 3.80–5.20)
RDW: 16.4 % — ABNORMAL HIGH (ref 11.5–14.5)
WBC: 2.4 10*3/uL — ABNORMAL LOW (ref 3.6–11.0)

## 2017-01-03 LAB — BASIC METABOLIC PANEL
Anion gap: 5 (ref 5–15)
BUN: 20 mg/dL (ref 6–20)
CO2: 28 mmol/L (ref 22–32)
Calcium: 8.7 mg/dL — ABNORMAL LOW (ref 8.9–10.3)
Chloride: 99 mmol/L — ABNORMAL LOW (ref 101–111)
Creatinine, Ser: 0.58 mg/dL (ref 0.44–1.00)
GFR calc Af Amer: >60 mL/min (ref >60)
GFR calc non Af Amer: >60 mL/min (ref >60)
Glucose, Bld: 224 mg/dL — ABNORMAL HIGH (ref 65–99)
Potassium: 3.8 mmol/L (ref 3.5–5.1)
Sodium: 132 mmol/L — ABNORMAL LOW (ref 135–145)

## 2017-01-03 LAB — MAGNESIUM: Magnesium: 1.3 mg/dL — ABNORMAL LOW (ref 1.7–2.4)

## 2017-01-03 MED ORDER — HEPARIN SOD (PORK) LOCK FLUSH 100 UNIT/ML IV SOLN
500.0000 [IU] | Freq: Once | INTRAVENOUS | Status: AC
  Administered 2017-01-03: 500 [IU] via INTRAVENOUS

## 2017-01-03 MED ORDER — MAGNESIUM SULFATE 4 GM/100ML IV SOLN
4.0000 g | Freq: Once | INTRAVENOUS | Status: AC
  Administered 2017-01-03: 4 g via INTRAVENOUS
  Filled 2017-01-03: qty 100

## 2017-01-03 MED ORDER — TBO-FILGRASTIM 300 MCG/0.5ML ~~LOC~~ SOSY
300.0000 ug | PREFILLED_SYRINGE | Freq: Once | SUBCUTANEOUS | Status: AC
  Administered 2017-01-03: 300 ug via SUBCUTANEOUS
  Filled 2017-01-03: qty 0.5

## 2017-01-04 ENCOUNTER — Inpatient Hospital Stay: Payer: Medicare Other

## 2017-01-04 DIAGNOSIS — C569 Malignant neoplasm of unspecified ovary: Secondary | ICD-10-CM | POA: Diagnosis not present

## 2017-01-04 MED ORDER — TBO-FILGRASTIM 300 MCG/0.5ML ~~LOC~~ SOSY
300.0000 ug | PREFILLED_SYRINGE | Freq: Once | SUBCUTANEOUS | Status: AC
  Administered 2017-01-04: 300 ug via SUBCUTANEOUS
  Filled 2017-01-04: qty 0.5

## 2017-01-05 ENCOUNTER — Inpatient Hospital Stay: Payer: Medicare Other

## 2017-01-05 DIAGNOSIS — C569 Malignant neoplasm of unspecified ovary: Secondary | ICD-10-CM | POA: Diagnosis not present

## 2017-01-05 MED ORDER — TBO-FILGRASTIM 300 MCG/0.5ML ~~LOC~~ SOSY
300.0000 ug | PREFILLED_SYRINGE | Freq: Once | SUBCUTANEOUS | Status: AC
  Administered 2017-01-05: 300 ug via SUBCUTANEOUS
  Filled 2017-01-05: qty 0.5

## 2017-01-08 NOTE — Progress Notes (Signed)
[facility] day:  01/09/2017   Chief Complaint: [place] SARINANA is a 71 y.o. female with stage IV ovarian cancer who is seen for assessment prior to cycle #3 pembrolizumab.  HPI:  The patient was last seen in the medical oncology clinic on 12/26/2016.  At that time, she felt well.  She received cycle #2 pembrolizumab.  She received GCSF (Granix) x 5 days (08/30, 08/31, 09/04 - 01/05/2017).  WBC was 2400 with an Hooker of 1000 on 01/03/2017.  During the interim, she describes more joint pain the first week (driday and Saturday) following her last treatment. She is otherwise feeling "good".  She denies fevers, sweats, significant weight loss, nausea, vomiting or abdominal pain.    Past Medical History:  Diagnosis Date  . CAD (coronary artery disease)    a. 09/2012 Cath: LM nl, LAD 95p, 56m LCX 940mOM2 50, RCA 100.  . Marland Kitchenholelithiasis   . Chronic diastolic CHF (congestive heart failure) ([facility]  . Collagen vascular disease (HCC)    Rheumatoid Arthritis.  . Esophageal stricture   . Exertional shortness of breath   . GERD (gastroesophageal reflux disease)   . H/O hiatal hernia   . Herniated disc   . History of pancytopenia   . Hyperlipidemia   . Hypertension   . Hypokalemia   . Leukopenia 2012   s/p bone marrow biopsy, Dr. PaMa Hillock. NSTEMI (non-ST elevated myocardial infarction) ([facility]CD[place]5/2014   "mild" (10/18/2012)  . Ovarian cancer ([facility]2016   chemo and hysterectomy  . Pneumonia 2013; 08/2012   "one lung; double" (10/18/2012)  . Rheumatoid arthritis(714.0)   . Type I diabetes mellitus (HCTampa   "dx'd in 1957" (10/18/2012)    Past Surgical History:  Procedure Laterality Date  . ABDOMINAL HYSTERECTOMY  06/15/2015   Dx L/S, EXLAP TAH BSO omentectomy RSRx colostomy diaphragm resection stripping  . CARDIAC CATHETERIZATION  10/18/2012   "first one was today" (10/18/2012)  . CATARACT EXTRACTION W/ INTRAOCULAR LENS IMPLANT Right 2010  .  CHOLECYSTECTOMY  06/15/2015   combined case with ovarian cancer debulking  . COLON SURGERY    . CORONARY ARTERY BYPASS GRAFT N/A 10/19/2012   Procedure: CORONARY ARTERY BYPASS GRAFTING (CABG);  Surgeon: StMelrose NakayamaMD;  [facility] Service: Open Heart Surgery;  Laterality: N/A;  . ESOPHAGEAL DILATION     "3 or 4 times" (10/19/2011)  . ESOPHAGOGASTRODUODENOSCOPY  2012   Dr. IfMinna Merritts. OSTOMY    . OVARY SURGERY     removal  . PERIPHERAL VASCULAR CATHETERIZATION N/A 03/02/2015   Procedure: PoGlori Luisath Insertion;  Surgeon: JaAlgernon HuxleyMD;  [facility];  Service: Cardiovascular;  Laterality: N/A;  . TUBAL LIGATION  1970    Family History  Problem Relation Age of Onset  . Diabetes Mother   . Arthritis Mother   . Diabetes Father   . Arthritis Father   . Bone cancer Sister     Social History:  reports that she quit smoking about 22 years ago. Her smoking use included Cigarettes. She has a 30.00 pack-year smoking history. She has never used smokeless tobacco. She reports that she does not drink alcohol or use drugs.  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 She is accompanied by her husband today.  Allergies:  Allergies  Allergen Reactions  . Codeine Nausea And Vomiting  . Latex Rash  Current Medications: Current Outpatient Prescriptions  Medication Sig Dispense Refill  . Calcium-Vitamin D 600-200 MG-UNIT tablet Take 1 tablet by mouth 2 (two) times daily.     . cholecalciferol (VITAMIN D) 1000 units tablet Take 1,000 Units by mouth daily.    Marland Kitchen enoxaparin (LOVENOX) 60 MG/0.6ML injection INJECT 0.6 ML (60 MG TOTAL) INTO THE SKIN EVERY 12 HOURS 60 mL 2  . folic acid (FOLVITE) 1 MG tablet Take 1 tablet (1 mg total) by mouth daily. 90 tablet 3  . furosemide (LASIX) 80 MG tablet Take 0.5 tablets by mouth 2 (two) times daily.    . insulin lispro (HUMALOG) 100 UNIT/ML injection Inject 0.06 mLs (6 Units total)  into the skin daily. (Patient taking differently: Inject 0-85 Units into the skin daily. Via insulin pump) 30 mL 3  . lidocaine-prilocaine (EMLA) cream APPLY 1 APPLICATION TOPICALLY AS NEEDED 1 HOUR PRIOR TO TREATMENT. COVER WITH PRESS N SEAL UNTIL TREATMENT TIME AS DIRECTED 30 g 1  . loratadine (CLARITIN) 10 MG tablet Take 1 tablet by mouth daily as needed.    . magnesium oxide (MAG-OX) 400 (241.3 Mg) MG tablet TAKE 1 TABLET BY MOUTH ONCE DAILY 30 tablet 1  . metoprolol tartrate (LOPRESSOR) 25 MG tablet Take 1 tablet (25 mg total) by mouth 2 (two) times daily. 180 tablet 3  . ondansetron (ZOFRAN) 8 MG tablet TAKE 1 TABLET BY MOUTH EVERY 8 HOURS AS NEEDED FOR NAUSEA OR VOMITING 30 tablet 1  . pantoprazole (PROTONIX) 40 MG tablet TAKE 1 TABLET BY MOUTH TWICE (2) DAILY 180 tablet 1  . Pembrolizumab (KEYTRUDA IV) Inject into the vein every 14 (fourteen) days.    . potassium chloride SA (K-DUR,KLOR-CON) 20 MEQ tablet TAKE 1 TABLET BY MOUTH DAILY 30 tablet 2  . predniSONE (DELTASONE) 5 MG tablet Take 5 mg by mouth daily with breakfast.    . vitamin C (ASCORBIC ACID) 500 MG tablet Take 500 mg by mouth daily.    Marland Kitchen zinc gluconate 50 MG tablet Take 50 mg by mouth daily.     Current Facility-Administered Medications  Medication Dose Route Frequency Provider Last Rate Last Dose  . Tbo-Filgrastim (GRANIX) injection 300 mcg  300 mcg Subcutaneous Once Lequita Asal, MD       Facility-Administered Medications Ordered in Other Visits  Medication Dose Route Frequency Provider Last Rate Last Dose  . sodium chloride 0.9 % injection 10 mL  10 mL Intracatheter PRN Corcoran, Melissa C, MD      . sodium chloride 0.9 % injection 10 mL  10 mL Intravenous PRN Lequita Asal, MD   10 mL at 04/13/15 6803    Review of Systems:  GENERAL:  Feeling "pretty good".  No fever, chills or sweats.  Weight down 1 pound. PERFORMANCE STATUS (ECOG):  1 HEENT:  No visual changes, sore throat, mouth sores or  tenderness. Lungs:  No shortness of breath.  No cough.  No hemoptysis. Cardiac:  No chest pain, palpitations, orthopnea, or PND. GI:  No nausea, vomiting, diarrhea, melena or hematochezia. GU:  No urgency, frequency, dysuria, or hematuria. Musculoskeletal:  Severe rheumatoid arthritis (on prednisone).  Arthralgias worse after treatment.  Osteoporosis.  No back pain. No muscle tenderness. Extremities:  No pain or swelling. Skin:  No increased bruising or bleeding.  No rashes or skin changes. Neuro:  Neuropathy (stable).  No headache, numbness or weakness, balance or coordination issues. Endocrine:  Diabetes on an insulin pump.  Blood sugar elevated on steroids.  Occasional  hot flashes.  Psych:  No mood changes, depression or anxiety. Pain: No pain. Review of systems:  All other systems reviewed and found to be negative.  Physical Exam:  Blood pressure 103/63, pulse 82, temperature (!) 96.6 F (35.9 C), temperature source Tympanic, resp. rate 16, weight 133 lb 9.6 oz (60.6 kg). GENERAL:  Well developed, well nourished woman sitting comfortably in the exam room in no acute distress. MENTAL STATUS:  Alert and oriented to person, place and time. HEAD:  Alopecia.  Normocephalic, atraumatic, face symmetric, no Cushingoid features. EYES:  Gold rimmed glasses.  Blue eyes.  No conjunctivitis or scleral icterus.  ENT: Oropharynx clear without lesion. Tongue normal. Mucous membranes moist.  RESPIRATORY: Clear to auscultation without rales, wheezes or rhonchi. CARDIOVASCULAR: Regular rate and rhythm without murmur, rub or gallop. ABDOMEN: Soft, , non-tender with active bowel sounds and no hepatosplenomegaly. No palpable nodularity or masses.  Insulin pump.  Ostomy bag. SKIN: No rashes or ulcers. EXTREMITIES:  No edema, skin discoloration or tenderness. No palpable cords. LYMPH NODES: No palpable cervical, supraclavicular, axillary or inguinal adenopathy  NEUROLOGICAL: Appropriate. PSYCH:  Appropriate.  Radiology studies: 02/13/2015:  Abdomen and pelvic CT revealed bilateral mass-like adnexal regions (right adnexal mass 5.6 x 5.0 cm and the left adnexa mass 10.3 x 6.0 x 9.9 cm).  There was a large amount of soft tissue throughout the peritoneal cavity involving the omentum and other peritoneal surfaces.  There was a small volume ascites. There was a peripheral 3.3 x 1.9 cm low-attenuation lesion overlying the right lobe of the liver, likely representing a serosal implant. There was 1.4 x 2.2 cm ill-defined peripheral lesion within the inferior aspect of segment 6 adjacent to the ampulla in the duodenum.  04/28/2015:  Abdominal and pelvic CT revealed decreasing bilateral ovarian masses.   The left adnexal mass measured 4.0 x 6.1 cm (previously 5.0 x 8.5 cm). The right ovary measured 3.8 x 4.9 cm (previously 4.7 x 5.6 cm).  There was improved peritoneal carcinomatosis.  There was a small amount of ascites.  The hepatic dome lesion was stable. The previously seen right hepatic lobe lesion was not well-visualized.  There was a small left pleural effusion and trace right pleural effusion.  The nodular lesion at the ampulla of Vater, extending into the duodenum was stable.  There was no biliary ductal dilatation. 08/01/2015:  Rght upper extremity ultrasound revealed a near occlusive thrombus within the central portion of the right internal jugular vein and central portion of the right subclavian vein. 09/01/2015:  Chest, abdomen, and pelvic CT revealed continued decrease in perihepatic fluid collection contiguous with the right pleural space with percutaneous drain.  09/11/2015:  Chest, abdomen, and pelvic CT revealed a residual versus recurrent fluid collection posterior to the right hepatic lobe (2.6 x 1.1 x 4.0 cm).   10/13/2015:  Chest, abdomen, and pelvic CT revealed resolution of the empyema. 02/15/2016:  Chest, abdomen, and pelvic CT revealed multiple soft tissue nodules throughout the  pelvis, small bowel mesenteric and peritoneum and, concerning for peritoneal metastatic disease.  There was soft tissue irregularity within the left upper quadrant.  There was mild right hydronephrosis (etiology unclear).  There was interval resolution of previously described right pleural based fluid and gas collection. There was a pleural based nodule within the right lower hemi-thorax concerning for pleural based metastasis.  There were small bilateral pleural effusions.  There was pulmonary nodularity, predominately within the left upper lobe (metastatic or infectious/inflammatory etiology).  There was slightly  increased mediastinal adenopathy (infectious/inflammatory or metastatic).  There was a low attenuation lesion within the left hepatic lobe (complicated fluid within the fissure or metastatic disease). 03/08/2016:  Abdomen and pelvic CT revealed mild progression of peritoneal disease in the abdomen.  There was interval increase in loculated fluid around the lateral segment left liver, stomach, and spleen.  There was persistent soft tissue lesion at the level of the ampulla with mild intra and extrahepatic biliary duct dilatation.  There was persistent intrahepatic and capsular metastatic disease involving the liver. 05/26/2016:  Head MRI revealed multiple small areas of acute infarct involving the occipital parietal lobe bilaterally and the left lateral cerebellum,  consistent with posterior circulation emboli.  06/10/2016:  Adomen and pelvic CT revealed progression of peritoneal carcinomatosis predominantly in the perihepatic space, bilateral lower quadrants and pelvis.  There was worsening bilateral obstructive uropathy due to malignant involvement of the pelvic ureters.  There was stable small pleural/subpleural nodules at the right lung base.  There was stable small left pleural effusion.  There was decreased small volume perihepatic ascites.  08/09/2016:  Abdomen and pelvic CT revealed interval  increase and loculated fluid collections within the peritoneal space consistent with progression of peritoneal ovarian carcinoma metastasis.  There was one large 7 cm collection in the RIGHT lower quadrant which had increased significantly in size and may represent of point of small bowel obstruction.  The proximal stomach and duodenum were decompressed. The mid small bowel was mildly dilated. There was concern for obstruction given the large intraperitoneal fluid collection.  There was increase in subcapsular hepatic metastatic fluid collections.  There was enlargement of rounded ampullary lesion in the second portion duodenum concerning for metastatic lesion.  There was mild biliary duct dilatation (stable).  There was nodular pleural metastasis in the RIGHT lower lobe pleural space. 11/03/2016:  Abdomen and pelvic CT revealed multifocal cystic metastases in the abdomen/pelvis, reflecting peritoneal disease.  The dominant lesion in the left anterior abdomen has progressed, while additional lesions were mixed.  Peritoneal disease along the liver and spleen progressed.  Pleural-based metastases along the posterior right hemithorax progressed. There were trace left pleural effusion.  There was a stable 1.7 cm duodenal lesion at the ampulla. 11/29/2016:  Abdomen and pelvic CT revealed mild interval enlargement of pleural metastasis the RIGHT lower lobe.  There was overall mild decrease in cystic peritoneal metastasis within the abdomen pelvis. The solid lesion at the terminal ileum was decreased in size. Subcapsular lesion in the liver was slightly decreased in size.  There was no evidence of disease progression the abdomen pelvis.  There was mild intrahepatic duct dilatation similar prior.  The ampullary lesion was slightly larger.  There was mild hydronephrosis and hydroureter on the LEFT likely related to cystic metastasis at the LEFT vesicoureteral junction 12/09/2016:  MRCP revealed the cystic lesion within  the pancreatic head/uncinate process had decreased in size and demonstrated no suspicious characteristics. This can be presumed a pseudocyst or focus of side branch duct ectasia.  There was similar to slight progression of extensive peritoneal metastasis since 11/29/2016. Grossly similar abdominal nodal and right pleural metastasis.  There was chronic mild common duct dilatation with similar soft tissue fullness about the ampulla compared to 11/29/2016.  There was improved to resolved left-sided hydronephrosis.  Admissions: [facility] from 06/15/2015 - 06/22/2015.  She underwent exploratory laparotomy, lysis of adhesions, total abdominal hysterectomy with bilateral salpingo-oophorectomy, infracolic omentectomy, optimal tumor debulking(< 1 cm), recto-sigmoid resection with creation of end colostomy, cholecystectomy, mobilization  of splenic flexure and liver with diaphragmatic stripping on 06/15/2015. The right diaphragm was cleared of tumor. During dissection, the diaphragm was entered and closed with sutures.  [place] from 07/01/2015 - 07/03/2015 with pleural effusion. [place] from 07/07/2015 - 07/09/2015 with recurrent pleural effusion. [place] from 07/29/2015 - 08/10/2015 with a right-sided empyema and liver abscess. She underwent CT-guided placement of a liver abscess drain on 07/30/2015.  Liver abscess culture grew out group B strep and Enterobacter which was sensitive to Zosyn. She was transitioned to ertapenem Colbert Ewing) prior to discharge.  She was readmitted to [facility] on 09/17/2015. 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  Head MRI on 05/26/2016 revealed multiple small areas of acute infarct involving the occipital parietal lobe bilaterally and the left lateral cerebellum,  consistent with posterior circulation emboli.  Work-up included a negative carotid ultrasound and echocardiogram. [facility] from 08/10/2016 - 08/14/2016 with a small bowel obstruction.  She was managed  conservatively. [place] from 09/27/2016 - 09/28/2016 with symptomatic anemia and diarrhea.   Appointment on 01/09/2017  Component Date Value Ref Range Status  . WBC 01/09/2017 3.4* 3.6 - 11.0 K/uL Final  . RBC 01/09/2017 3.75* 3.80 - 5.20 MIL/uL Final  . Hemoglobin 01/09/2017 11.1* 12.0 - 16.0 g/dL Final  . HCT 01/09/2017 31.8* 35.0 - 47.0 % Final  . MCV 01/09/2017 84.9  80.0 - 100.0 fL Final  . MCH 01/09/2017 29.5  26.0 - 34.0 pg Final  . MCHC 01/09/2017 34.8  32.0 - 36.0 g/dL Final  . RDW 01/09/2017 16.8* 11.5 - 14.5 % Final  . Platelets 01/09/2017 244  150 - 440 K/uL Final  . Neutrophils Relative % 01/09/2017 53  % Final  . Neutro Abs 01/09/2017 1.8  1.4 - 6.5 K/uL Final  . Lymphocytes Relative 01/09/2017 27  % Final  . Lymphs Abs 01/09/2017 0.9* 1.0 - 3.6 K/uL Final  . Monocytes Relative 01/09/2017 18  % Final  . Monocytes Absolute 01/09/2017 0.6  0.2 - 0.9 K/uL Final  . Eosinophils Relative 01/09/2017 1  % Final  . Eosinophils Absolute 01/09/2017 0.0  0 - 0.7 K/uL Final  . Basophils Relative 01/09/2017 1  % Final  . Basophils Absolute 01/09/2017 0.0  0 - 0.1 K/uL Final  . Sodium 01/09/2017 131* 135 - 145 mmol/L Final  . Potassium 01/09/2017 3.9  3.5 - 5.1 mmol/L Final  . Chloride 01/09/2017 95* 101 - 111 mmol/L Final  . CO2 01/09/2017 28  22 - 32 mmol/L Final  . Glucose, Bld 01/09/2017 277* 65 - 99 mg/dL Final  . BUN 01/09/2017 25* 6 - 20 mg/dL Final  . Creatinine, Ser 01/09/2017 0.93  0.44 - 1.00 mg/dL Final  . Calcium 01/09/2017 9.2  8.9 - 10.3 mg/dL Final  . Total Protein 01/09/2017 7.5  6.5 - 8.1 g/dL Final  . Albumin 01/09/2017 3.1* 3.5 - 5.0 g/dL Final  . AST 01/09/2017 14* 15 - 41 U/L Final  . ALT 01/09/2017 9* 14 - 54 U/L Final  . Alkaline Phosphatase 01/09/2017 74  38 - 126 U/L Final  . Total Bilirubin 01/09/2017 0.5  0.3 - 1.2 mg/dL Final  . GFR calc non Af Amer 01/09/2017 60* >60 mL/min Final  . GFR calc Af Amer 01/09/2017 >60  >60 mL/min Final   Comment:  (NOTE) The eGFR has been calculated using the CKD EPI equation. This calculation has not been validated in all clinical situations. eGFR's persistently <60 mL/min signify possible Chronic Kidney Disease.   Marland Kitchen  Anion gap 01/09/2017 8  5 - 15 Final  . Magnesium 01/09/2017 1.3* 1.7 - 2.4 mg/dL Final    Assessment:  ARTHUR AYDELOTTE is a 71 y.o. female with progressive stage IV ovarian cancer.  She presented with abdominal discomfort and bloating.  Omental biopsy on 02/23/2015 revealed metastatic high grade serous carcinoma, consistent with gynecologic origin.   She was initially diagnosed with clinical stage IIIC (T3cN1Mx).  CA125 was 707 on 02/17/2015.  She received 4 cycles of neoadjuvant carboplatin and Taxol (03/05/2015 - 05/22/2015).  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  She had loose stools on oral magnesium.  She was initially on Neurontin then switched Lyrica with cycle #3.  Cycle #4 was notable for neutropenia (ANC 300) requiring GCSF x 3 days.    She underwent exploratory laparotomy, lysis of adhesions, total abdominal hysterectomy with bilateral salpingo-oophorectomy, infracolic omentectomy, optimal tumor debulking(< 1 cm), recto-sigmoid resection with creation of end colostomy, cholecystectomy, mobilization of splenic flexure and liver with diaphragmatic stripping on 06/15/2015. The right diaphragm was cleared of tumor. During dissection, the diaphragm was entered and closed with sutures.  She received tamoxifen from 02/25/2016 - 03/14/2016.  She received 4 cycles of carboplatin and gemcitabine (03/21/2016 - 05/24/2016) with GCSF/Neulasta support.  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  PDL-1 testing revealed a combined score was 5 (>=1 positive).     She received 2 cycles of Doxil (06/21/2016 - 07/19/2016) with Neulasta support.  She received 4 cycles of topotecan with Neulasta support (08/22/2016 - 10/10/2016).    She is s/p 2 cycles of pembrolizumab  (Keytruda) from 12/06/2016 -  12/26/2016.  Cycle #1 was complicated by neutropenia (ANC 400) requiring GCSF/Granix x 3 days.  She received GCSF x 5 days with cycle #2.  Abdomen and pelvic CT on 11/29/2016 revealed mild interval enlargement of pleural metastasis the RIGHT lower lobe.  There was overall mild decrease in cystic peritoneal metastasis within the abdomen pelvis. The solid lesion at the terminal ileum was decreased in size. Subcapsular lesion in the liver was slightly decreased in size.  There was no evidence of disease progression the abdomen pelvis.  There was mild intrahepatic duct dilatation similar prior.  The ampullary lesion was slightly larger.  There was mild hydronephrosis and hydroureter on the LEFT likely related to cystic metastasis at the LEFT vesicoureteral junction  CA125 was 802.9 on 03/30/2015, 567.9 on 04/13/2015, 168.8 on 05/15/2015, 85.2 on 07/17/2015, 68.6 on 07/28/2015, 34.5 on 09/17/2015, 20.7 on 11/06/2015, 49 on 01/22/2016, 106.1 on 02/22/2016, 138.8 on 03/07/2016, 220.8 on 03/21/2016, 152.8 on 04/11/2016, 125.6 on 04/18/2016, 76.8 on 05/03/2016, 60.2 on 05/24/2016, 44 on 07/19/2016, 65.8 on 08/17/2016, 53.4 on 09/12/2016, 47.3 on 10/10/2016, 91 on 12/20/2016, and 141.1 on 01/09/2017.  She has a history of recurrent right sided pleural effusion.  She underwent thoracentesis of 650 cc on post-operative day 3.  She was admitted to [facility] on 07/01/2015 and 07/07/2015 for recurrent shortness of breath.  She underwent 2 additional thoracenteses (1.1 L on 07/02/2015 and 850 cc on 07/08/2015).  Cytology was negative x 2.  Bilateral lower extremity duplex on 07/03/2015 was negative.  Echo revealed an EF of 55-60% on 07/08/2015 and 60-65% on 05/27/2016.    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 She was on Lovenox 60 mg twice a day.   She switched  to Eliquis on 04/16/2016 then returned back to Lovenox after her CVA.  She has severe rheumatoid arthritis.  Methotrexate and Enbrel were initially on hold.  She has a normocytic anemia.  Work-up on 02/17/2015 and 05/24/2016 revealed a normal ferritin, B12, folate, TSH.  She denies any melena or hematochezia.    She has anemia due to chronic disease. She received 1 unit PRBCs during her admission at [facility]. She denies any melena or hematochezia. She has diabetes and is on an insulin pump.  She has had persistent neutropenia felt secondary to her rheumatoid arthritis.  Folate and MMA were normal.  TSH was 6.13 (high) with a free T4 of 1.17 (0.61-1.12).  She began methotrexate (10 mg a week) and prednisone (5 mg a day) for severe rheumatoid arthritis on 12/17/2015.  She remains on prednisone alone (5 mg a day).  Bone marrow aspirate and biopsy on 06/09/2010 revealed a hypercellular marrow (70%) with no evidence of dysplasia or malignancy.  Flow cytometry was negative.  Cytogenetics were normal (46,XX).  FISH studies were negative for MDS.  Bone marrow aspirate and biopsy on 12/03/2015 revealed a normocellular to mildy hypercellular marrow for age (40%) with left shifted myelopoiesis, non specific dyserythropoiesis and mild megakaryocytic atypia with no increase in blasts.  There were multiple small nonspecific lymphoid aggregates (favor reactive). There was no increase in reticulin.  There was decreased myeloid cells (37%) with left shifted maturation and 1% atypical myelod blasts.  There was relatively increased monocytic cells (11%), relatively increased lymphoid cells (36%), and relatively increased eosinophils (6%).  Cytogenetics were normal (88, XX).  SNP microarray was normal.  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  She receives IV magnesium weekly.  She has chemotherapy induced anemia.  She has received Procrit (last 11/07/2016).  Labs on 08/22/2016 revealed a normal  B12 and folate.  Ferritin was 37, iron saturation 11% and TIBC 281.  Code status is DNR/DNI.  She developed acute LFT elevation on 11/28/2016.  Event was preceded by upper abdominal discomfort.  Hepatitis B core antibody total and hepatitis C antibody were negative.  Symptomatically, she feels good.  Exam is stable.  She has chemotherapy induced anemia. WBC 3400 with an ANC of 1800. Hemoglobin is 11.1, hematocrit 31.8, and platelets 244,000.  Magnesium is 1.3 (low).  Plan: 1.  Labs today:  CBC with diff, CMP, Mg, CA125. 2.  Magnesium 4 gram IV today.  3.  Cycle #3 pembrolizumab today.  4.  RTC on 01/12/2017 and 01/13/2017 for GCSF. 5.  RTC on 09/18, 09/19, and 09/20 for GCSF.   6.  Check labs (BMP, Mg), +/- IV Mg on 01/17/2017. 7.  Abdomen and pelvic CT on 01/20/2017. 8.  RTC on 01/23/2017 for MD assessment, labs (CBC with diff, CMP, Mg), review of scans, cycle #4 pembrolizumab, and IV Mg.   Melissa C. Mike Gip, MD  01/09/2017, 10:20 AM

## 2017-01-09 ENCOUNTER — Inpatient Hospital Stay: Payer: Medicare Other

## 2017-01-09 ENCOUNTER — Inpatient Hospital Stay (HOSPITAL_BASED_OUTPATIENT_CLINIC_OR_DEPARTMENT_OTHER): Payer: Medicare Other | Admitting: Hematology and Oncology

## 2017-01-09 ENCOUNTER — Encounter: Payer: Self-pay | Admitting: Hematology and Oncology

## 2017-01-09 ENCOUNTER — Other Ambulatory Visit: Payer: Self-pay | Admitting: *Deleted

## 2017-01-09 VITALS — BP 103/63 | HR 82 | Temp 96.6°F | Resp 16 | Wt 133.6 lb

## 2017-01-09 DIAGNOSIS — I2510 Atherosclerotic heart disease of native coronary artery without angina pectoris: Secondary | ICD-10-CM | POA: Diagnosis not present

## 2017-01-09 DIAGNOSIS — M2550 Pain in unspecified joint: Secondary | ICD-10-CM | POA: Diagnosis not present

## 2017-01-09 DIAGNOSIS — C569 Malignant neoplasm of unspecified ovary: Secondary | ICD-10-CM

## 2017-01-09 DIAGNOSIS — C786 Secondary malignant neoplasm of retroperitoneum and peritoneum: Secondary | ICD-10-CM

## 2017-01-09 DIAGNOSIS — D6481 Anemia due to antineoplastic chemotherapy: Secondary | ICD-10-CM | POA: Diagnosis not present

## 2017-01-09 DIAGNOSIS — E876 Hypokalemia: Secondary | ICD-10-CM

## 2017-01-09 DIAGNOSIS — M069 Rheumatoid arthritis, unspecified: Secondary | ICD-10-CM | POA: Diagnosis not present

## 2017-01-09 DIAGNOSIS — I998 Other disorder of circulatory system: Secondary | ICD-10-CM | POA: Diagnosis not present

## 2017-01-09 DIAGNOSIS — K219 Gastro-esophageal reflux disease without esophagitis: Secondary | ICD-10-CM | POA: Diagnosis not present

## 2017-01-09 DIAGNOSIS — Z87891 Personal history of nicotine dependence: Secondary | ICD-10-CM

## 2017-01-09 DIAGNOSIS — Z808 Family history of malignant neoplasm of other organs or systems: Secondary | ICD-10-CM

## 2017-01-09 DIAGNOSIS — Z79899 Other long term (current) drug therapy: Secondary | ICD-10-CM | POA: Diagnosis not present

## 2017-01-09 DIAGNOSIS — I252 Old myocardial infarction: Secondary | ICD-10-CM

## 2017-01-09 DIAGNOSIS — C801 Malignant (primary) neoplasm, unspecified: Secondary | ICD-10-CM

## 2017-01-09 DIAGNOSIS — K449 Diaphragmatic hernia without obstruction or gangrene: Secondary | ICD-10-CM

## 2017-01-09 DIAGNOSIS — Z7189 Other specified counseling: Secondary | ICD-10-CM

## 2017-01-09 DIAGNOSIS — D61818 Other pancytopenia: Secondary | ICD-10-CM

## 2017-01-09 DIAGNOSIS — Z86718 Personal history of other venous thrombosis and embolism: Secondary | ICD-10-CM

## 2017-01-09 DIAGNOSIS — Z87442 Personal history of urinary calculi: Secondary | ICD-10-CM

## 2017-01-09 DIAGNOSIS — E785 Hyperlipidemia, unspecified: Secondary | ICD-10-CM

## 2017-01-09 DIAGNOSIS — Z5112 Encounter for antineoplastic immunotherapy: Secondary | ICD-10-CM

## 2017-01-09 DIAGNOSIS — E108 Type 1 diabetes mellitus with unspecified complications: Secondary | ICD-10-CM

## 2017-01-09 DIAGNOSIS — I5032 Chronic diastolic (congestive) heart failure: Secondary | ICD-10-CM

## 2017-01-09 DIAGNOSIS — Z794 Long term (current) use of insulin: Secondary | ICD-10-CM

## 2017-01-09 DIAGNOSIS — Z66 Do not resuscitate: Secondary | ICD-10-CM

## 2017-01-09 LAB — COMPREHENSIVE METABOLIC PANEL
ALT: 9 U/L — ABNORMAL LOW (ref 14–54)
AST: 14 U/L — ABNORMAL LOW (ref 15–41)
Albumin: 3.1 g/dL — ABNORMAL LOW (ref 3.5–5.0)
Alkaline Phosphatase: 74 U/L (ref 38–126)
Anion gap: 8 (ref 5–15)
BUN: 25 mg/dL — ABNORMAL HIGH (ref 6–20)
CO2: 28 mmol/L (ref 22–32)
Calcium: 9.2 mg/dL (ref 8.9–10.3)
Chloride: 95 mmol/L — ABNORMAL LOW (ref 101–111)
Creatinine, Ser: 0.93 mg/dL (ref 0.44–1.00)
GFR calc Af Amer: >60 mL/min (ref >60)
GFR calc non Af Amer: 60 mL/min — ABNORMAL LOW (ref >60)
Glucose, Bld: 277 mg/dL — ABNORMAL HIGH (ref 65–99)
Potassium: 3.9 mmol/L (ref 3.5–5.1)
Sodium: 131 mmol/L — ABNORMAL LOW (ref 135–145)
Total Bilirubin: 0.5 mg/dL (ref 0.3–1.2)
Total Protein: 7.5 g/dL (ref 6.5–8.1)

## 2017-01-09 LAB — CBC WITH DIFFERENTIAL/PLATELET
Basophils Absolute: 0 10*3/uL (ref 0–0.1)
Basophils Relative: 1 %
Eosinophils Absolute: 0 10*3/uL (ref 0–0.7)
Eosinophils Relative: 1 %
HCT: 31.8 % — ABNORMAL LOW (ref 35.0–47.0)
Hemoglobin: 11.1 g/dL — ABNORMAL LOW (ref 12.0–16.0)
Lymphocytes Relative: 27 %
Lymphs Abs: 0.9 10*3/uL — ABNORMAL LOW (ref 1.0–3.6)
MCH: 29.5 pg (ref 26.0–34.0)
MCHC: 34.8 g/dL (ref 32.0–36.0)
MCV: 84.9 fL (ref 80.0–100.0)
Monocytes Absolute: 0.6 10*3/uL (ref 0.2–0.9)
Monocytes Relative: 18 %
Neutro Abs: 1.8 10*3/uL (ref 1.4–6.5)
Neutrophils Relative %: 53 %
Platelets: 244 10*3/uL (ref 150–440)
RBC: 3.75 MIL/uL — ABNORMAL LOW (ref 3.80–5.20)
RDW: 16.8 % — ABNORMAL HIGH (ref 11.5–14.5)
WBC: 3.4 10*3/uL — ABNORMAL LOW (ref 3.6–11.0)

## 2017-01-09 LAB — MAGNESIUM: Magnesium: 1.3 mg/dL — ABNORMAL LOW (ref 1.7–2.4)

## 2017-01-09 MED ORDER — SODIUM CHLORIDE 0.9 % IV SOLN
Freq: Once | INTRAVENOUS | Status: AC
  Administered 2017-01-09: 11:00:00 via INTRAVENOUS
  Filled 2017-01-09: qty 1000

## 2017-01-09 MED ORDER — SODIUM CHLORIDE 0.9 % IV SOLN
200.0000 mg | Freq: Once | INTRAVENOUS | Status: AC
  Administered 2017-01-09: 200 mg via INTRAVENOUS
  Filled 2017-01-09: qty 8

## 2017-01-09 MED ORDER — HEPARIN SOD (PORK) LOCK FLUSH 100 UNIT/ML IV SOLN
500.0000 [IU] | Freq: Once | INTRAVENOUS | Status: AC
  Administered 2017-01-09: 500 [IU] via INTRAVENOUS

## 2017-01-09 MED ORDER — MAGNESIUM SULFATE 4 GM/100ML IV SOLN
4.0000 g | Freq: Once | INTRAVENOUS | Status: AC
  Administered 2017-01-09: 4 g via INTRAVENOUS
  Filled 2017-01-09: qty 100

## 2017-01-09 NOTE — Progress Notes (Signed)
Would like to discuss receiving Neulasta injection this week.  Patient does not offer any problems today.

## 2017-01-10 ENCOUNTER — Ambulatory Visit (INDEPENDENT_AMBULATORY_CARE_PROVIDER_SITE_OTHER): Payer: Medicare Other

## 2017-01-10 VITALS — BP 118/62 | HR 73 | Temp 98.5°F | Resp 14 | Ht 63.0 in | Wt 134.8 lb

## 2017-01-10 DIAGNOSIS — Z0000 Encounter for general adult medical examination without abnormal findings: Secondary | ICD-10-CM | POA: Diagnosis not present

## 2017-01-10 DIAGNOSIS — Z1331 Encounter for screening for depression: Secondary | ICD-10-CM

## 2017-01-10 DIAGNOSIS — Z1389 Encounter for screening for other disorder: Secondary | ICD-10-CM

## 2017-01-10 LAB — CA 125: Cancer Antigen (CA) 125: 141.1 U/mL — ABNORMAL HIGH (ref 0.0–38.1)

## 2017-01-10 NOTE — Patient Instructions (Addendum)
  [name] , Thank you for taking time to come for your Medicare Wellness Visit. I appreciate your ongoing commitment to your health goals. Please review the following plan we discussed and let me know if I can assist you in the future.   Follow up with Dr. Caryl Bis as needed.    Let the office know when you receive the flu shot from pharmacy.  Have a great day!  These are the goals we discussed: Goals    . Healthy Lifestyle          Stay hydrated and continue drinking plenty of fluids/water.  Healthy/diabetic diet.    Stay active and continue walking for exercise as tolerated. Chair exercises as demonstrated.        This is a list of the screening recommended for you and due dates:  Health Maintenance  Topic Date Due  . Urine Protein Check  12/10/2014  . Hemoglobin A1C  11/25/2016  . Complete foot exam   12/06/2016  . Eye exam for diabetics  12/07/2016  . Tetanus Vaccine  01/10/2017*  . Flu Shot  01/12/2017*  . Mammogram  01/31/2018  . Colon Cancer Screening  10/11/2021  . DEXA scan (bone density measurement)  Completed  .  Hepatitis C: One time screening is recommended by Center for Disease Control  (CDC) for  adults born from [birth year] through 1967.   Completed  . Pneumonia vaccines  Completed  *Topic was postponed. The date shown is not the original due date.

## 2017-01-10 NOTE — Progress Notes (Signed)
Subjective:   [name] is a 71 y.o. female who presents for Medicare Annual (Subsequent) preventive examination.  Review of Systems:  No ROS.  Medicare Wellness Visit. Additional risk factors are reflected in the social history. Cardiac Risk Factors include: advanced age (>58mn, >>19women);hypertension;diabetes mellitus     Objective:     Vitals: BP 118/62 (BP Location: Left Arm, Patient Position: Sitting, Cuff Size: Normal)   Pulse 73   Temp 98.5 F (36.9 C) (Oral)   Resp 14   Ht 5' 3"  (1.6 m)   Wt 134 lb 12.8 oz (61.1 kg)   SpO2 97%   BMI 23.88 kg/m   Body mass index is 23.88 kg/m.   Tobacco History  Smoking Status  . Former Smoker  . Packs/day: 1.00  . Years: 30.00  . Types: Cigarettes  . Quit date: 06/11/1994  Smokeless Tobacco  . Never Used     Counseling given: Not Answered   Past Medical History:  Diagnosis Date  . CAD (coronary artery disease)    a. 09/2012 Cath: LM nl, LAD 95p, 764mLCX 9050mM2 50, RCA 100.  . CMarland Kitchenolelithiasis   . Chronic diastolic CHF (congestive heart failure) ([place] . Collagen vascular disease (H[facility])    Rheumatoid Arthritis.  . Esophageal stricture   . Exertional shortness of breath   . GERD (gastroesophageal reflux disease)   . H/O hiatal hernia   . Herniated disc   . History of pancytopenia   . Hyperlipidemia   . Hypertension   . Hypokalemia   . Leukopenia 2012   s/p bone marrow biopsy, Dr. PanMa Hillock NSTEMI (non-ST elevated myocardial infarction) ([facility]/2014   "mild" (10/18/2012)  . Ovarian cancer ([place]   chemo and hysterectomy  . Pneumonia 2013; 08/2012   "one lung; double" (10/18/2012)  . Rheumatoid arthritis(714.0)   . Type I diabetes mellitus ([place]  "dx'd in 1957" (10/18/2012)   Past Surgical History:  Procedure Laterality Date  . ABDOMINAL HYSTERECTOMY  06/15/2015   Dx L/S, EXLAP TAH BSO omentectomy RSRx colostomy diaphragm resection stripping  . CARDIAC CATHETERIZATION  10/18/2012   "first one was  today" (10/18/2012)  . CATARACT EXTRACTION W/ INTRAOCULAR LENS IMPLANT Right 2010  . CHOLECYSTECTOMY  06/15/2015   combined case with ovarian cancer debulking  . COLON SURGERY    . CORONARY ARTERY BYPASS GRAFT N/A 10/19/2012   Procedure: CORONARY ARTERY BYPASS GRAFTING (CABG);  Surgeon: SteMelrose NakayamaD;  [facility];  Laterality: N/A;  . ESOPHAGEAL DILATION     "3 or 4 times" (10/19/2011)  . ESOPHAGOGASTRODUODENOSCOPY  2012   Dr. IftMinna Merritts OSTOMY    . OVARY SURGERY     removal  . PERIPHERAL VASCULAR CATHETERIZATION N/A 03/02/2015   Procedure: PorGlori Luisth Insertion;  Surgeon: JasAlgernon HuxleyD;  [facility];  Service: Cardiovascular;  Laterality: N/A;  . TUBAL LIGATION  1970   Family History  Problem Relation Age of Onset  . Diabetes Mother   . Arthritis Mother   . Diabetes Father   . Arthritis Father   . Aneurysm Sister        neck  . Cancer Sister        lung   History  Sexual Activity  . Sexual activity: No    Outpatient Encounter Prescriptions as of 01/10/2017  Medication Sig  . Calcium-Vitamin D 600-200 MG-UNIT tablet Take 1 tablet by  mouth 2 (two) times daily.   . cholecalciferol (VITAMIN D) 1000 units tablet Take 1,000 Units by mouth daily.  Marland Kitchen enoxaparin (LOVENOX) 60 MG/0.6ML injection INJECT 0.6 ML (60 MG TOTAL) INTO THE SKIN EVERY 12 HOURS  . folic acid (FOLVITE) 1 MG tablet Take 1 tablet (1 mg total) by mouth daily.  . furosemide (LASIX) 80 MG tablet Take 0.5 tablets by mouth 2 (two) times daily.  . insulin lispro (HUMALOG) 100 UNIT/ML injection Inject 0.06 mLs (6 Units total) into the skin daily. (Patient taking differently: Inject 0-85 Units into the skin daily. Via insulin pump)  . lidocaine-prilocaine (EMLA) cream APPLY 1 APPLICATION TOPICALLY AS NEEDED 1 HOUR PRIOR TO TREATMENT. COVER WITH PRESS N SEAL UNTIL TREATMENT TIME AS DIRECTED  . loratadine (CLARITIN) 10 MG tablet Take 1 tablet by mouth daily as  needed.  . magnesium oxide (MAG-OX) 400 (241.3 Mg) MG tablet TAKE 1 TABLET BY MOUTH ONCE DAILY  . metoprolol tartrate (LOPRESSOR) 25 MG tablet Take 1 tablet (25 mg total) by mouth 2 (two) times daily.  . ondansetron (ZOFRAN) 8 MG tablet TAKE 1 TABLET BY MOUTH EVERY 8 HOURS AS NEEDED FOR NAUSEA OR VOMITING  . pantoprazole (PROTONIX) 40 MG tablet TAKE 1 TABLET BY MOUTH TWICE (2) DAILY  . Pembrolizumab (KEYTRUDA IV) Inject into the vein every 14 (fourteen) days.  . potassium chloride SA (K-DUR,KLOR-CON) 20 MEQ tablet TAKE 1 TABLET BY MOUTH DAILY  . predniSONE (DELTASONE) 5 MG tablet Take 5 mg by mouth daily with breakfast.  . vitamin C (ASCORBIC ACID) 500 MG tablet Take 500 mg by mouth daily.  Marland Kitchen zinc gluconate 50 MG tablet Take 50 mg by mouth daily.   Facility-Administered Encounter Medications as of 01/10/2017  Medication  . sodium chloride 0.9 % injection 10 mL  . sodium chloride 0.9 % injection 10 mL  . Tbo-Filgrastim (GRANIX) injection 300 mcg    Activities of Daily Living In your present state of health, do you have any difficulty performing the following activities: 01/10/2017 09/27/2016  Hearing? Tempie Donning  Comment [place]? N N  Difficulty concentrating or making decisions? Tempie Donning  Walking or climbing stairs? Y Y  Dressing or bathing? N N  Doing errands, shopping? N N  Preparing Food and eating ? N -  Using the Toilet? N -  In the past six months, have you accidently leaked urine? N -  Do you have problems with loss of bowel control? N -  Comment Followed by Ocologist. GI, and PCP.  Colostomy. -  Managing your Medications? N -  Managing your Finances? N -  Housekeeping or managing your Housekeeping? N -  Some recent data might be hidden    Patient Care Team: Leone Haven, MD as PCP - General (Family Medicine) Lequita Asal, MD as PCP - Hematology/Oncology (Hematology and Oncology) Clent Jacks, RN as Registered Nurse Jefm Bryant, Susann Givens., MD  (Rheumatology) Gabriel Carina Betsey Holiday, MD as Physician Assistant (Internal Medicine) Gillis Ends, MD as Referring Physician (Obstetrics and Gynecology) Mellody Drown, MD as Referring Physician (Obstetrics and Gynecology) Minna Merritts, MD as Consulting Physician (Cardiology) Jonathon Bellows, MD as Consulting Physician (Surgery)    Assessment:    This is a routine wellness examination for [place]. The goal of the wellness visit is to assist the patient how to close the gaps in care and create a preventative care plan for the patient.   The roster of all physicians providing medical care  to patient is listed in the Snapshot section of the chart.  Taking calcium VIT D as appropriate/Osteoporosis reviewed.    Safety issues reviewed; Smoke and carbon monoxide detectors in the home. No firearms in the home.  Wears seatbelts when driving or riding with others. Patient does wear sunscreen or protective clothing when in direct sunlight. No violence in the home.  Depression- PHQ 2 &9 complete.  No signs/symptoms or verbal communication regarding little pleasure in doing things, feeling down, depressed or hopeless. No changes in sleeping, energy, eating, concentrating.  No thoughts of self harm or harm towards others.  Time spent on this topic is 8 minutes.   Patient is alert, normal appearance, oriented to person/place/and time.  Correctly identified the president of the [place], recall of 3/3 words, and performing simple calculations. Displays appropriate judgement and can read correct time from watch face.   No new identified risk were noted.  No failures at ADL's or IADL's.  Ambulates with walker at home and cane when running errands.  BMI- discussed the importance of a healthy diet, water intake and the benefits of aerobic exercise. Educational material provided.   24 hour diet recall: Breakfast: 2 eggs , toast with jelly Lunch: vegetables, baked chicken, roll Dinner: noodles,  fruit Snack: ensure drink Daily fluid intake: 0 cups of caffeine, 1  cups of water  Dental- Dental work on hold due to illness.  Dr. Kenton Kingfisher.  Eye- Visual acuity not assessed per patient preference since they have regular follow up with the ophthalmologist.  Wears corrective lenses.  Sleep patterns- Sleeps 8 hours at night.  Wakes feeling rested.  TDAP vaccine deferred per patient preference.  Follow up with insurance.  Educational material provided.  Diabetes; followed by Lavone Orn, MD.  Patient Concerns: None at this time. Follow up with PCP as needed.  Exercise Activities and Dietary recommendations Current Exercise Habits: The patient does not participate in regular exercise at present  Goals    . Healthy Lifestyle          Stay hydrated and continue drinking plenty of fluids/water.  Healthy/diabetic diet.    Stay active and continue walking for exercise as tolerated. Chair exercises as demonstrated.       Fall Risk Fall Risk  01/10/2017 12/20/2016 11/21/2016 11/14/2016 10/24/2016  Falls in the past year? No No No No No  Number falls in past yr: - - - - -  Injury with Fall? - - - - -   Depression Screen PHQ 2/9 Scores 01/10/2017 01/11/2016 06/12/2014 09/20/2013  PHQ - 2 Score 0 0 0 0  PHQ- 9 Score 0 - - -     Cognitive Function MMSE - Mini Mental State Exam 01/11/2016  Orientation to time 5  Orientation to Place 5  Registration 3  Attention/ Calculation 5  Recall 3  Language- name 2 objects 2  Language- repeat 1  Language- follow 3 step command 3  Language- read & follow direction 1  Write a sentence 1  Copy design 1  Total score 30     6CIT Screen 01/10/2017  What Year? 0 points  What month? 0 points  What time? 0 points  Count back from 20 0 points  Months in reverse 0 points  Repeat phrase 0 points  Total Score 0    Immunization History  Administered Date(s) Administered  . Influenza Split 03/11/2011, 02/24/2012, 02/19/2014, 03/05/2016  .  Influenza Whole 02/19/2013  . Influenza-Unspecified 01/31/2015, 02/27/2015  . Pneumococcal Conjugate-13 09/20/2013  .  Pneumococcal Polysaccharide-23 02/06/2011, 09/27/2012   Screening Tests Health Maintenance  Topic Date Due  . URINE MICROALBUMIN  12/10/2014  . HEMOGLOBIN A1C  11/25/2016  . FOOT EXAM  12/06/2016  . OPHTHALMOLOGY EXAM  12/07/2016  . TETANUS/TDAP  01/10/2017 (Originally 12/13/1964)  . INFLUENZA VACCINE  01/12/2017 (Originally 11/30/2016)  . MAMMOGRAM  01/31/2018  . COLONOSCOPY  10/11/2021  . DEXA SCAN  Completed  . Hepatitis C Screening  Completed  . PNA vac Low Risk Adult  Completed      Plan:    End of life planning; Advance aging; Advanced directives discussed. Copy of current HCPOA/Living Will on file.    I have personally reviewed and noted the following in the patient's chart:   . Medical and social history . Use of alcohol, tobacco or illicit drugs  . Current medications and supplements . Functional ability and status . Nutritional status . Physical activity . Advanced directives . List of other physicians . Hospitalizations, surgeries, and ER visits in previous 12 months . Vitals . Screenings to include cognitive, depression, and falls . Referrals and appointments  In addition, I have reviewed and discussed with patient certain preventive protocols, quality metrics, and best practice recommendations. A written personalized care plan for preventive services as well as general preventive health recommendations were provided to patient.     Varney Biles, LPN  3/59/4090

## 2017-01-12 ENCOUNTER — Inpatient Hospital Stay: Payer: Medicare Other

## 2017-01-12 DIAGNOSIS — C569 Malignant neoplasm of unspecified ovary: Secondary | ICD-10-CM | POA: Diagnosis not present

## 2017-01-12 MED ORDER — TBO-FILGRASTIM 300 MCG/0.5ML ~~LOC~~ SOSY
300.0000 ug | PREFILLED_SYRINGE | Freq: Once | SUBCUTANEOUS | Status: AC
  Administered 2017-01-12: 300 ug via SUBCUTANEOUS
  Filled 2017-01-12: qty 0.5

## 2017-01-13 ENCOUNTER — Inpatient Hospital Stay: Payer: Medicare Other

## 2017-01-13 ENCOUNTER — Telehealth: Payer: Self-pay | Admitting: Urgent Care

## 2017-01-13 ENCOUNTER — Other Ambulatory Visit: Payer: Self-pay | Admitting: Urgent Care

## 2017-01-13 ENCOUNTER — Other Ambulatory Visit: Payer: Self-pay

## 2017-01-13 DIAGNOSIS — C569 Malignant neoplasm of unspecified ovary: Secondary | ICD-10-CM | POA: Diagnosis not present

## 2017-01-13 DIAGNOSIS — R300 Dysuria: Secondary | ICD-10-CM

## 2017-01-13 LAB — URINALYSIS, COMPLETE (UACMP) WITH MICROSCOPIC
BACTERIA UA: NONE SEEN
BILIRUBIN URINE: NEGATIVE
GLUCOSE, UA: NEGATIVE mg/dL
HGB URINE DIPSTICK: NEGATIVE
Ketones, ur: NEGATIVE mg/dL
LEUKOCYTES UA: NEGATIVE
NITRITE: NEGATIVE
PROTEIN: NEGATIVE mg/dL
SPECIFIC GRAVITY, URINE: 1.017 (ref 1.005–1.030)
pH: 5 (ref 5.0–8.0)

## 2017-01-13 MED ORDER — TBO-FILGRASTIM 300 MCG/0.5ML ~~LOC~~ SOSY
300.0000 ug | PREFILLED_SYRINGE | Freq: Once | SUBCUTANEOUS | Status: AC
  Administered 2017-01-13: 300 ug via SUBCUTANEOUS
  Filled 2017-01-13: qty 0.5

## 2017-01-13 NOTE — Telephone Encounter (Signed)
Attempted to call patient to make her aware of her UA results. There was no answer at her home. In the event that she calls back this afternoon to triage, we can let her know that her UA was negative; no antibiotics are required at this time. We will still send the sample for C&S. If the sample yields a significant pathogen CFU, we will treat at that time. In the interim, patient should be encouraged to maintain adequate hydration in efforts to keep her urinary tract flushed. She also needs to monitor for the development of fever. Patient coming in next week for GCSF, at which time we can discuss persistent symptoms.

## 2017-01-13 NOTE — Telephone Encounter (Signed)
Patient in for GCSF today and advised lab of urinary tract symptoms that began yesterday. Patient with dysuria, frequency, and urgency. Denies fever. UA and urine C&S ordered by this NP. Will call patient with the results.

## 2017-01-14 LAB — URINE CULTURE

## 2017-01-16 ENCOUNTER — Ambulatory Visit: Payer: Medicare Other

## 2017-01-16 ENCOUNTER — Other Ambulatory Visit: Payer: Medicare Other

## 2017-01-16 ENCOUNTER — Ambulatory Visit: Payer: Medicare Other | Admitting: Hematology and Oncology

## 2017-01-17 ENCOUNTER — Other Ambulatory Visit: Payer: Self-pay

## 2017-01-17 ENCOUNTER — Telehealth: Payer: Self-pay

## 2017-01-17 ENCOUNTER — Inpatient Hospital Stay: Payer: Medicare Other

## 2017-01-17 ENCOUNTER — Other Ambulatory Visit: Payer: Self-pay | Admitting: *Deleted

## 2017-01-17 DIAGNOSIS — C569 Malignant neoplasm of unspecified ovary: Secondary | ICD-10-CM

## 2017-01-17 DIAGNOSIS — D61818 Other pancytopenia: Secondary | ICD-10-CM

## 2017-01-17 LAB — DIFFERENTIAL
Basophils Absolute: 0 10*3/uL (ref 0–0.1)
Basophils Relative: 1 %
Eosinophils Absolute: 0 10*3/uL (ref 0–0.7)
Eosinophils Relative: 1 %
Lymphocytes Relative: 19 %
Lymphs Abs: 0.5 10*3/uL — ABNORMAL LOW (ref 1.0–3.6)
Monocytes Absolute: 0.4 10*3/uL (ref 0.2–0.9)
Monocytes Relative: 14 %
Neutro Abs: 1.7 10*3/uL (ref 1.4–6.5)
Neutrophils Relative %: 65 %

## 2017-01-17 LAB — BASIC METABOLIC PANEL
Anion gap: 8 (ref 5–15)
BUN: 16 mg/dL (ref 6–20)
CO2: 27 mmol/L (ref 22–32)
Calcium: 9.1 mg/dL (ref 8.9–10.3)
Chloride: 96 mmol/L — ABNORMAL LOW (ref 101–111)
Creatinine, Ser: 0.63 mg/dL (ref 0.44–1.00)
GFR calc Af Amer: >60 mL/min (ref >60)
GFR calc non Af Amer: >60 mL/min (ref >60)
Glucose, Bld: 256 mg/dL — ABNORMAL HIGH (ref 65–99)
Potassium: 4.4 mmol/L (ref 3.5–5.1)
Sodium: 131 mmol/L — ABNORMAL LOW (ref 135–145)

## 2017-01-17 LAB — MAGNESIUM: Magnesium: 1.4 mg/dL — ABNORMAL LOW (ref 1.7–2.4)

## 2017-01-17 LAB — CBC
HCT: 30.3 % — ABNORMAL LOW (ref 35.0–47.0)
Hemoglobin: 10.4 g/dL — ABNORMAL LOW (ref 12.0–16.0)
MCH: 29.1 pg (ref 26.0–34.0)
MCHC: 34.3 g/dL (ref 32.0–36.0)
MCV: 85 fL (ref 80.0–100.0)
Platelets: 224 10*3/uL (ref 150–440)
RBC: 3.57 MIL/uL — ABNORMAL LOW (ref 3.80–5.20)
RDW: 16.6 % — ABNORMAL HIGH (ref 11.5–14.5)
WBC: 2.5 10*3/uL — ABNORMAL LOW (ref 3.6–11.0)

## 2017-01-17 MED ORDER — TBO-FILGRASTIM 300 MCG/0.5ML ~~LOC~~ SOSY
300.0000 ug | PREFILLED_SYRINGE | Freq: Once | SUBCUTANEOUS | Status: AC
  Administered 2017-01-17: 300 ug via SUBCUTANEOUS
  Filled 2017-01-17: qty 0.5

## 2017-01-17 MED ORDER — HEPARIN SOD (PORK) LOCK FLUSH 100 UNIT/ML IV SOLN
500.0000 [IU] | Freq: Once | INTRAVENOUS | Status: AC
  Administered 2017-01-17: 500 [IU] via INTRAVENOUS
  Filled 2017-01-17: qty 5

## 2017-01-17 MED ORDER — MAGNESIUM SULFATE 4 GM/100ML IV SOLN
4.0000 g | Freq: Once | INTRAVENOUS | Status: AC
  Administered 2017-01-17: 4 g via INTRAVENOUS
  Filled 2017-01-17: qty 100

## 2017-01-17 NOTE — Telephone Encounter (Signed)
Can she come here for a port lab draw and leave port accessed if able to receive IV contrast.  She will come back here to be de-accessed after CT.  The reason she wants to have port accessed is because they have been unsuccessful in the past with giving accessing an IV on her.

## 2017-01-17 NOTE — Telephone Encounter (Signed)
  Sounds good.  M  

## 2017-01-17 NOTE — Telephone Encounter (Signed)
Per Dr. Loletha Grayer patient is having a CT not MRI.  Patient has had recent renal insufficiency and it all depends on what her creatnine is at the time as to whether or not she can have contrast.  Thanks!

## 2017-01-17 NOTE — Telephone Encounter (Signed)
FYI:  She will need lab orders for Friday.

## 2017-01-17 NOTE — Telephone Encounter (Deleted)
What labs would you like to have drawn on Friday (before CT)?

## 2017-01-17 NOTE — Telephone Encounter (Signed)
Dr. Mike Gip was checking to see if patient would be getting IV contrast with her MRI scheduled for 01/20/17.  If so she would like to come here to have her port accessed (she is a very difficult stick and has been told in the past that she can not have port accessed at radiology).

## 2017-01-18 ENCOUNTER — Inpatient Hospital Stay: Payer: Medicare Other

## 2017-01-18 DIAGNOSIS — C569 Malignant neoplasm of unspecified ovary: Secondary | ICD-10-CM | POA: Diagnosis not present

## 2017-01-18 MED ORDER — TBO-FILGRASTIM 300 MCG/0.5ML ~~LOC~~ SOSY
300.0000 ug | PREFILLED_SYRINGE | Freq: Once | SUBCUTANEOUS | Status: AC
  Administered 2017-01-18: 300 ug via SUBCUTANEOUS
  Filled 2017-01-18: qty 0.5

## 2017-01-19 ENCOUNTER — Inpatient Hospital Stay: Payer: Medicare Other

## 2017-01-19 DIAGNOSIS — C569 Malignant neoplasm of unspecified ovary: Secondary | ICD-10-CM | POA: Diagnosis not present

## 2017-01-19 MED ORDER — TBO-FILGRASTIM 300 MCG/0.5ML ~~LOC~~ SOSY
300.0000 ug | PREFILLED_SYRINGE | Freq: Once | SUBCUTANEOUS | Status: AC
  Administered 2017-01-19: 300 ug via SUBCUTANEOUS
  Filled 2017-01-19: qty 0.5

## 2017-01-20 ENCOUNTER — Other Ambulatory Visit: Payer: Self-pay | Admitting: *Deleted

## 2017-01-20 ENCOUNTER — Inpatient Hospital Stay: Payer: Medicare Other | Admitting: *Deleted

## 2017-01-20 ENCOUNTER — Ambulatory Visit
Admission: RE | Admit: 2017-01-20 | Discharge: 2017-01-20 | Disposition: A | Discharge status: Home / Self Care | Payer: Medicare Other | Source: Ambulatory Visit | Attending: Urgent Care | Admitting: Urgent Care

## 2017-01-20 DIAGNOSIS — C786 Secondary malignant neoplasm of retroperitoneum and peritoneum: Secondary | ICD-10-CM | POA: Diagnosis not present

## 2017-01-20 DIAGNOSIS — I700 Atherosclerosis of aorta: Secondary | ICD-10-CM | POA: Diagnosis not present

## 2017-01-20 DIAGNOSIS — C782 Secondary malignant neoplasm of pleura: Secondary | ICD-10-CM | POA: Diagnosis not present

## 2017-01-20 DIAGNOSIS — Z95828 Presence of other vascular implants and grafts: Secondary | ICD-10-CM

## 2017-01-20 DIAGNOSIS — C778 Secondary and unspecified malignant neoplasm of lymph nodes of multiple regions: Secondary | ICD-10-CM | POA: Diagnosis not present

## 2017-01-20 DIAGNOSIS — D61818 Other pancytopenia: Secondary | ICD-10-CM

## 2017-01-20 DIAGNOSIS — J90 Pleural effusion, not elsewhere classified: Secondary | ICD-10-CM | POA: Diagnosis not present

## 2017-01-20 DIAGNOSIS — C569 Malignant neoplasm of unspecified ovary: Secondary | ICD-10-CM | POA: Diagnosis not present

## 2017-01-20 DIAGNOSIS — K838 Other specified diseases of biliary tract: Secondary | ICD-10-CM | POA: Diagnosis not present

## 2017-01-20 DIAGNOSIS — C801 Malignant (primary) neoplasm, unspecified: Secondary | ICD-10-CM

## 2017-01-20 DIAGNOSIS — N1330 Unspecified hydronephrosis: Secondary | ICD-10-CM | POA: Diagnosis not present

## 2017-01-20 HISTORY — DX: Heart failure, unspecified: I50.9

## 2017-01-20 LAB — CBC WITH DIFFERENTIAL/PLATELET
Basophils Absolute: 0.1 10*3/uL (ref 0–0.1)
Basophils Relative: 1 %
Eosinophils Absolute: 0 10*3/uL (ref 0–0.7)
Eosinophils Relative: 0 %
HCT: 31.6 % — ABNORMAL LOW (ref 35.0–47.0)
Hemoglobin: 10.6 g/dL — ABNORMAL LOW (ref 12.0–16.0)
Lymphocytes Relative: 6 %
Lymphs Abs: 1 10*3/uL (ref 1.0–3.6)
MCH: 28.4 pg (ref 26.0–34.0)
MCHC: 33.5 g/dL (ref 32.0–36.0)
MCV: 84.6 fL (ref 80.0–100.0)
Monocytes Absolute: 0.8 10*3/uL (ref 0.2–0.9)
Monocytes Relative: 5 %
Neutro Abs: 15.3 10*3/uL — ABNORMAL HIGH (ref 1.4–6.5)
Neutrophils Relative %: 88 %
Platelets: 228 10*3/uL (ref 150–440)
RBC: 3.73 MIL/uL — ABNORMAL LOW (ref 3.80–5.20)
RDW: 17.3 % — ABNORMAL HIGH (ref 11.5–14.5)
WBC: 17.2 10*3/uL — ABNORMAL HIGH (ref 3.6–11.0)

## 2017-01-20 LAB — BASIC METABOLIC PANEL
ANION GAP: 6 (ref 5–15)
BUN: 15 mg/dL (ref 6–20)
CHLORIDE: 97 mmol/L — AB (ref 101–111)
CO2: 29 mmol/L (ref 22–32)
Calcium: 9.2 mg/dL (ref 8.9–10.3)
Creatinine, Ser: 0.62 mg/dL (ref 0.44–1.00)
Glucose, Bld: 229 mg/dL — ABNORMAL HIGH (ref 65–99)
POTASSIUM: 4.2 mmol/L (ref 3.5–5.1)
SODIUM: 132 mmol/L — AB (ref 135–145)

## 2017-01-20 MED ORDER — HEPARIN SOD (PORK) LOCK FLUSH 100 UNIT/ML IV SOLN
500.0000 [IU] | INTRAVENOUS | Status: AC | PRN
  Administered 2017-01-20: 500 [IU]

## 2017-01-20 MED ORDER — SODIUM CHLORIDE 0.9% FLUSH
10.0000 mL | INTRAVENOUS | Status: AC | PRN
  Administered 2017-01-20: 10 mL
  Filled 2017-01-20: qty 10

## 2017-01-20 MED ORDER — IOPAMIDOL (ISOVUE-300) INJECTION 61%
100.0000 mL | Freq: Once | INTRAVENOUS | Status: AC | PRN
  Administered 2017-01-20: 100 mL via INTRAVENOUS

## 2017-01-23 ENCOUNTER — Inpatient Hospital Stay: Payer: Medicare Other

## 2017-01-23 ENCOUNTER — Other Ambulatory Visit: Payer: Self-pay | Admitting: *Deleted

## 2017-01-23 ENCOUNTER — Other Ambulatory Visit: Payer: Self-pay | Admitting: Oncology

## 2017-01-23 ENCOUNTER — Inpatient Hospital Stay (HOSPITAL_BASED_OUTPATIENT_CLINIC_OR_DEPARTMENT_OTHER): Payer: Medicare Other | Admitting: Hematology and Oncology

## 2017-01-23 ENCOUNTER — Other Ambulatory Visit: Payer: Self-pay | Admitting: Hematology and Oncology

## 2017-01-23 VITALS — BP 116/63 | HR 80 | Temp 96.9°F | Resp 18 | Wt 135.4 lb

## 2017-01-23 DIAGNOSIS — N1330 Unspecified hydronephrosis: Secondary | ICD-10-CM

## 2017-01-23 DIAGNOSIS — G629 Polyneuropathy, unspecified: Secondary | ICD-10-CM

## 2017-01-23 DIAGNOSIS — Z794 Long term (current) use of insulin: Secondary | ICD-10-CM

## 2017-01-23 DIAGNOSIS — Z7189 Other specified counseling: Secondary | ICD-10-CM

## 2017-01-23 DIAGNOSIS — I5032 Chronic diastolic (congestive) heart failure: Secondary | ICD-10-CM

## 2017-01-23 DIAGNOSIS — C569 Malignant neoplasm of unspecified ovary: Secondary | ICD-10-CM

## 2017-01-23 DIAGNOSIS — I2510 Atherosclerotic heart disease of native coronary artery without angina pectoris: Secondary | ICD-10-CM | POA: Diagnosis not present

## 2017-01-23 DIAGNOSIS — Z86718 Personal history of other venous thrombosis and embolism: Secondary | ICD-10-CM

## 2017-01-23 DIAGNOSIS — Z5111 Encounter for antineoplastic chemotherapy: Secondary | ICD-10-CM

## 2017-01-23 DIAGNOSIS — C786 Secondary malignant neoplasm of retroperitoneum and peritoneum: Secondary | ICD-10-CM

## 2017-01-23 DIAGNOSIS — I998 Other disorder of circulatory system: Secondary | ICD-10-CM | POA: Diagnosis not present

## 2017-01-23 DIAGNOSIS — K219 Gastro-esophageal reflux disease without esophagitis: Secondary | ICD-10-CM

## 2017-01-23 DIAGNOSIS — M2550 Pain in unspecified joint: Secondary | ICD-10-CM | POA: Diagnosis not present

## 2017-01-23 DIAGNOSIS — Z66 Do not resuscitate: Secondary | ICD-10-CM

## 2017-01-23 DIAGNOSIS — Z87891 Personal history of nicotine dependence: Secondary | ICD-10-CM

## 2017-01-23 DIAGNOSIS — Z79899 Other long term (current) drug therapy: Secondary | ICD-10-CM | POA: Diagnosis not present

## 2017-01-23 DIAGNOSIS — C782 Secondary malignant neoplasm of pleura: Secondary | ICD-10-CM

## 2017-01-23 DIAGNOSIS — C801 Malignant (primary) neoplasm, unspecified: Secondary | ICD-10-CM

## 2017-01-23 DIAGNOSIS — D649 Anemia, unspecified: Secondary | ICD-10-CM

## 2017-01-23 DIAGNOSIS — E785 Hyperlipidemia, unspecified: Secondary | ICD-10-CM

## 2017-01-23 DIAGNOSIS — K449 Diaphragmatic hernia without obstruction or gangrene: Secondary | ICD-10-CM

## 2017-01-23 DIAGNOSIS — I252 Old myocardial infarction: Secondary | ICD-10-CM

## 2017-01-23 DIAGNOSIS — D61818 Other pancytopenia: Secondary | ICD-10-CM

## 2017-01-23 DIAGNOSIS — M069 Rheumatoid arthritis, unspecified: Secondary | ICD-10-CM

## 2017-01-23 DIAGNOSIS — Z7689 Persons encountering health services in other specified circumstances: Secondary | ICD-10-CM

## 2017-01-23 DIAGNOSIS — I82C11 Acute embolism and thrombosis of right internal jugular vein: Secondary | ICD-10-CM

## 2017-01-23 DIAGNOSIS — Z808 Family history of malignant neoplasm of other organs or systems: Secondary | ICD-10-CM

## 2017-01-23 DIAGNOSIS — D6481 Anemia due to antineoplastic chemotherapy: Secondary | ICD-10-CM | POA: Diagnosis not present

## 2017-01-23 DIAGNOSIS — Z87442 Personal history of urinary calculi: Secondary | ICD-10-CM

## 2017-01-23 DIAGNOSIS — T451X5A Adverse effect of antineoplastic and immunosuppressive drugs, initial encounter: Secondary | ICD-10-CM

## 2017-01-23 DIAGNOSIS — E876 Hypokalemia: Secondary | ICD-10-CM

## 2017-01-23 DIAGNOSIS — E108 Type 1 diabetes mellitus with unspecified complications: Secondary | ICD-10-CM

## 2017-01-23 LAB — CBC WITH DIFFERENTIAL/PLATELET
Basophils Absolute: 0 10*3/uL (ref 0–0.1)
Basophils Relative: 1 %
Eosinophils Absolute: 0 10*3/uL (ref 0–0.7)
Eosinophils Relative: 1 %
HCT: 31.1 % — ABNORMAL LOW (ref 35.0–47.0)
Hemoglobin: 10.8 g/dL — ABNORMAL LOW (ref 12.0–16.0)
Lymphocytes Relative: 22 %
Lymphs Abs: 0.8 10*3/uL — ABNORMAL LOW (ref 1.0–3.6)
MCH: 29.1 pg (ref 26.0–34.0)
MCHC: 34.6 g/dL (ref 32.0–36.0)
MCV: 84.2 fL (ref 80.0–100.0)
Monocytes Absolute: 0.6 10*3/uL (ref 0.2–0.9)
Monocytes Relative: 16 %
Neutro Abs: 2.3 10*3/uL (ref 1.4–6.5)
Neutrophils Relative %: 60 %
Platelets: 202 10*3/uL (ref 150–440)
RBC: 3.7 MIL/uL — ABNORMAL LOW (ref 3.80–5.20)
RDW: 17.2 % — ABNORMAL HIGH (ref 11.5–14.5)
WBC: 3.9 10*3/uL (ref 3.6–11.0)

## 2017-01-23 LAB — COMPREHENSIVE METABOLIC PANEL
ALT: 11 U/L — ABNORMAL LOW (ref 14–54)
AST: 17 U/L (ref 15–41)
Albumin: 2.9 g/dL — ABNORMAL LOW (ref 3.5–5.0)
Alkaline Phosphatase: 84 U/L (ref 38–126)
Anion gap: 7 (ref 5–15)
BUN: 21 mg/dL — ABNORMAL HIGH (ref 6–20)
CO2: 29 mmol/L (ref 22–32)
Calcium: 9.1 mg/dL (ref 8.9–10.3)
Chloride: 98 mmol/L — ABNORMAL LOW (ref 101–111)
Creatinine, Ser: 0.89 mg/dL (ref 0.44–1.00)
GFR calc Af Amer: >60 mL/min (ref >60)
GFR calc non Af Amer: >60 mL/min (ref >60)
Glucose, Bld: 222 mg/dL — ABNORMAL HIGH (ref 65–99)
Potassium: 3.9 mmol/L (ref 3.5–5.1)
Sodium: 134 mmol/L — ABNORMAL LOW (ref 135–145)
Total Bilirubin: 0.5 mg/dL (ref 0.3–1.2)
Total Protein: 7.1 g/dL (ref 6.5–8.1)

## 2017-01-23 LAB — MAGNESIUM: Magnesium: 1.3 mg/dL — ABNORMAL LOW (ref 1.7–2.4)

## 2017-01-23 MED ORDER — SODIUM CHLORIDE 0.9 % IV SOLN
INTRAVENOUS | Status: DC
  Administered 2017-01-23: 11:00:00 via INTRAVENOUS
  Filled 2017-01-23: qty 1000

## 2017-01-23 MED ORDER — HEPARIN SOD (PORK) LOCK FLUSH 100 UNIT/ML IV SOLN
INTRAVENOUS | Status: AC
  Filled 2017-01-23: qty 5

## 2017-01-23 MED ORDER — SODIUM CHLORIDE 0.9% FLUSH
10.0000 mL | INTRAVENOUS | Status: DC | PRN
  Administered 2017-01-23: 10 mL via INTRAVENOUS
  Filled 2017-01-23: qty 10

## 2017-01-23 MED ORDER — MAGNESIUM SULFATE 4 GM/100ML IV SOLN
4.0000 g | Freq: Once | INTRAVENOUS | Status: AC
  Administered 2017-01-23: 4 g via INTRAVENOUS
  Filled 2017-01-23 (×2): qty 100

## 2017-01-23 MED ORDER — HEPARIN SOD (PORK) LOCK FLUSH 100 UNIT/ML IV SOLN
500.0000 [IU] | Freq: Once | INTRAVENOUS | Status: AC
  Administered 2017-01-23: 500 [IU] via INTRAVENOUS
  Filled 2017-01-23: qty 5

## 2017-01-23 NOTE — Telephone Encounter (Signed)
Dx:  Malignant neoplasm of ovary, unspecif...   Ref Range & Units 09:18  Potassium 3.5 - 5.1 mmol/L 3.9

## 2017-01-23 NOTE — Progress Notes (Signed)
Nutrition Follow-up:  Nutrition follow-up completed with patient and husband during infusion this am.  Patient receiving palliative chemotherapy.  Patient with stage IV ovarian cancer and followed by Dr. Mike Gip.    Patient reports that appetite is good and has been drinking protein shakes.  Patient continues to follow a low fiber diet and consumes small frequent meals.    Continues with regular bowel movements.   Medications: reviewed  Labs: reviewed  Anthropometrics:   Weight has decreased slightly from last nutrition visit in 09/01/2016 of 138 lb.    NUTRITION DIAGNOSIS: Food and nutrition related knowledge deficit improved   MALNUTRITION DIAGNOSIS: none at this time   INTERVENTION:   Encouraged patient to continue eating high calorie, high protein foods Encouraged use of oral nutrition supplements to prevent weight loss    MONITORING, EVALUATION, GOAL: weight trends, intake   NEXT VISIT: as needed  Janet Santa B. [name], [place], [place] Registered Dietitian [phone] (pager)

## 2017-01-23 NOTE — Patient Instructions (Signed)

## 2017-01-23 NOTE — Progress Notes (Signed)
Here for follow up. Per pt she is doing well

## 2017-01-23 NOTE — Progress Notes (Signed)
[facility] day:  01/23/2017   Chief Complaint: [name] is a 71 y.o. female with stage IV ovarian cancer who is seen for review of interval restaging scans and assessment prior to cycle #4 pembrolizumab.  HPI:  The patient was last seen in the medical oncology clinic on 01/09/2017.  At that time, she felt good.  WBC was 3400 with an ANC of 1800. Hemoglobin was 11.1, hematocrit 31.8, and platelets 244,000.  Magnesium was 1.3 (low).She received cycle #3 pembrolizumab.  She received GCSF (Granix) x 4 days (09/13, 09/14, 09/18 - 01/19/2017).  WBC was 17,200 with an [place] of 15,300 on 01/19/2017.  She received IV magnesium weekly for hypomagnesemia.  Abdomen and pelvic CT scan on 01/20/2017 revealed complex mixed interval changes in widespread metastatic disease in the peritoneal cavity, lower chest and thoracic, abdominal and pelvic lymph nodes. Right lower chest pleural metastasis and several of the peritoneal tumor implants had increased.  There was new right inguinal lymphadenopathy.  Some of the peritoneal tumor implants had decreased.  The small dependent left pleural effusion was slightly increased .  The mild left hydroureteronephrosis was improved.  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   The chronic biliary ductal dilatation was stable .  There was no evidence of bowel obstruction.  During the interim, patient feeling "great". Patient denies any B symptoms. She is eating well; no significant weight loss. Patient has no issues with her bowels. Patient denies significant arthralgias associated with the GCSF injections.    Past Medical History:  Diagnosis Date  . CAD (coronary artery disease)    a. 09/2012 Cath: LM nl, LAD 95p, 46m LCX 931mOM2 50, RCA 100.  . Marland KitchenHF (congestive heart failure) ([facility] [place]  . Cholelithiasis   . Collagen vascular disease (HCC)    Rheumatoid Arthritis.  . Esophageal stricture   . Exertional shortness of breath   .  GERD (gastroesophageal reflux disease)   . H/O hiatal hernia   . Herniated disc   . History of pancytopenia   . Hyperlipidemia   . Hypertension   . Hypokalemia   . Leukopenia 2012   s/p bone marrow biopsy, Dr. PaMa Hillock. NSTEMI (non-ST elevated myocardial infarction) ([facility]   "mild" (10/18/2012)  . Ovarian cancer ([facility]   chemo and hysterectomy  . Pneumonia 2013; 08/2012   "one lung; double" (10/18/2012)  . Rheumatoid arthritis(714.0)   . Type I diabetes mellitus ([facility]   "dx'd in [facility]" (10/18/2012)    Past Surgical History:  Procedure Laterality Date  . ABDOMINAL HYSTERECTOMY  06/15/2015   Dx L/S, EXLAP TAH BSO omentectomy RSRx colostomy diaphragm resection stripping  . CARDIAC CATHETERIZATION  10/18/2012   "first one was today" (10/18/2012)  . CATARACT EXTRACTION W/ INTRAOCULAR LENS IMPLANT Right 2010  . CHOLECYSTECTOMY  06/15/2015   combined case with ovarian cancer debulking  . COLON SURGERY    . CORONARY ARTERY BYPASS GRAFT N/A 10/19/2012   Procedure: CORONARY ARTERY BYPASS GRAFTING (CABG);  Surgeon: StMelrose NakayamaMD;  Location: [facility] Service: Open Heart Surgery;  Laterality: N/A;  . ESOPHAGEAL DILATION     "3 or 4 times" (10/19/2011)  . ESOPHAGOGASTRODUODENOSCOPY  2012   Dr. IfMinna Merritts. OSTOMY    . OVARY SURGERY     removal  . PERIPHERAL VASCULAR CATHETERIZATION N/A 03/02/2015   Procedure: PoGlori Luisath Insertion;  Surgeon: JaAlgernon HuxleyMD;  [facility];  Service: Cardiovascular;  Laterality: N/A;  . TUBAL LIGATION  1970    Family History  Problem Relation Age of Onset  . Diabetes Mother   . Arthritis Mother   . Diabetes Father   . Arthritis Father   . Aneurysm Sister        neck  . Cancer Sister        lung    Social History:  reports that she quit smoking about 22 years ago. Her smoking use included Cigarettes. She has a 30.00 pack-year smoking history. She has never used smokeless tobacco. She reports that she does not drink  alcohol or use drugs.  Patient is a retired Art therapist. Patient denies exposure to radiation and toxins. She is accompanied by her husband today.  Allergies:  Allergies  Allergen Reactions  . Codeine Nausea And Vomiting  . Latex Rash    Current Medications: Current Outpatient Prescriptions  Medication Sig Dispense Refill  . Calcium-Vitamin D 600-200 MG-UNIT tablet Take 1 tablet by mouth 2 (two) times daily.     . cholecalciferol (VITAMIN D) 1000 units tablet Take 1,000 Units by mouth daily.    Marland Kitchen enoxaparin (LOVENOX) 60 MG/0.6ML injection INJECT 0.6 ML (60 MG TOTAL) INTO THE SKIN EVERY 12 HOURS 60 mL 2  . folic acid (FOLVITE) 1 MG tablet Take 1 tablet (1 mg total) by mouth daily. 90 tablet 3  . insulin lispro (HUMALOG) 100 UNIT/ML injection Inject 0.06 mLs (6 Units total) into the skin daily. (Patient taking differently: Inject 0-85 Units into the skin daily. Via insulin pump) 30 mL 3  . lidocaine-prilocaine (EMLA) cream APPLY 1 APPLICATION TOPICALLY AS NEEDED 1 HOUR PRIOR TO TREATMENT. COVER WITH PRESS N SEAL UNTIL TREATMENT TIME AS DIRECTED 30 g 1  . magnesium oxide (MAG-OX) 400 (241.3 Mg) MG tablet TAKE 1 TABLET BY MOUTH ONCE DAILY 30 tablet 1  . metoprolol tartrate (LOPRESSOR) 25 MG tablet Take 1 tablet (25 mg total) by mouth 2 (two) times daily. 180 tablet 3  . pantoprazole (PROTONIX) 40 MG tablet TAKE 1 TABLET BY MOUTH TWICE (2) DAILY 180 tablet 1  . Pembrolizumab (KEYTRUDA IV) Inject into the vein every 14 (fourteen) days.    . potassium chloride SA (K-DUR,KLOR-CON) 20 MEQ tablet TAKE 1 TABLET BY MOUTH DAILY 30 tablet 2  . predniSONE (DELTASONE) 5 MG tablet Take 5 mg by mouth daily with breakfast.    . vitamin C (ASCORBIC ACID) 500 MG tablet Take 500 mg by mouth daily.    Marland Kitchen zinc gluconate 50 MG tablet Take 50 mg by mouth daily.    . furosemide (LASIX) 80 MG tablet Take 0.5 tablets by mouth 2 (two) times daily.    Marland Kitchen loratadine (CLARITIN) 10 MG tablet Take 1 tablet by mouth daily  as needed.    . ondansetron (ZOFRAN) 8 MG tablet TAKE 1 TABLET BY MOUTH EVERY 8 HOURS AS NEEDED FOR NAUSEA OR VOMITING (Patient not taking: Reported on 01/23/2017) 30 tablet 1   Current Facility-Administered Medications  Medication Dose Route Frequency Provider Last Rate Last Dose  . Tbo-Filgrastim (GRANIX) injection 300 mcg  300 mcg Subcutaneous Once Lequita Asal, MD       Facility-Administered Medications Ordered in Other Visits  Medication Dose Route Frequency Provider Last Rate Last Dose  . heparin lock flush 100 unit/mL  500 Units Intravenous Once Corcoran, Melissa C, MD      . sodium chloride 0.9 % injection 10 mL  10 mL Intracatheter PRN Nolon Stalls  C, MD      . sodium chloride 0.9 % injection 10 mL  10 mL Intravenous PRN Lequita Asal, MD   10 mL at 04/13/15 0852  . sodium chloride flush (NS) 0.9 % injection 10 mL  10 mL Intravenous PRN Lequita Asal, MD   10 mL at 01/23/17 3244    Review of Systems:  GENERAL:  Feeling "great".  No fever, chills or sweats.  Weight up 1 pound. PERFORMANCE STATUS (ECOG):  1 HEENT:  No visual changes, sore throat, mouth sores or tenderness. Lungs:  No shortness of breath.  No cough.  No hemoptysis. Cardiac:  No chest pain, palpitations, orthopnea, or PND. GI:  No nausea, vomiting, diarrhea, melena or hematochezia. GU:  No urgency, frequency, dysuria, or hematuria. Musculoskeletal:  Severe rheumatoid arthritis (on prednisone).  Osteoporosis.  No back pain. No muscle tenderness. Extremities:  No pain or swelling. Skin:  No increased bruising or bleeding.  No rashes or skin changes. Neuro:  Neuropathy (stable).  No headache, numbness or weakness, balance or coordination issues. Endocrine:  Diabetes on an insulin pump.  Blood sugar elevated on steroids.  Occasional hot flashes.  Psych:  No mood changes, depression or anxiety. Pain: No pain. Review of systems:  All other systems reviewed and found to be negative.  Physical  Exam:  Blood pressure 116/63, pulse 80, temperature (!) 96.9 F (36.1 C), temperature source Tympanic, resp. rate 18, weight 135 lb 6.4 oz (61.4 kg). GENERAL:  Well developed, well nourished woman sitting comfortably in the exam room in no acute distress. MENTAL STATUS:  Alert and oriented to person, place and time. HEAD:  Wearing a silver cap.  Alopecia.  Normocephalic, atraumatic, face symmetric, no Cushingoid features. EYES:  Gold rimmed glasses.  Blue eyes.  No conjunctivitis or scleral icterus.  ENT: Oropharynx clear without lesion. Tongue normal. Mucous membranes moist.  RESPIRATORY: Clear to auscultation without rales, wheezes or rhonchi. CARDIOVASCULAR: Regular rate and rhythm without murmur, rub or gallop. ABDOMEN: Soft, , non-tender with active bowel sounds and no hepatosplenomegaly. No palpable nodularity or masses.  Insulin pump.  Ostomy bag. SKIN: No rashes or ulcers. EXTREMITIES:  No edema, skin discoloration or tenderness. No palpable cords. LYMPH NODES: No palpable cervical, supraclavicular, axillary or inguinal adenopathy  NEUROLOGICAL: Appropriate. PSYCH: Appropriate.   Radiology studies: 02/13/2015:  Abdomen and pelvic CT revealed bilateral mass-like adnexal regions (right adnexal mass 5.6 x 5.0 cm and the left adnexa mass 10.3 x 6.0 x 9.9 cm).  There was a large amount of soft tissue throughout the peritoneal cavity involving the omentum and other peritoneal surfaces.  There was a small volume ascites. There was a peripheral 3.3 x 1.9 cm low-attenuation lesion overlying the right lobe of the liver, likely representing a serosal implant. There was 1.4 x 2.2 cm ill-defined peripheral lesion within the inferior aspect of segment 6 adjacent to the ampulla in the duodenum.  04/28/2015:  Abdominal and pelvic CT revealed decreasing bilateral ovarian masses.   The left adnexal mass measured 4.0 x 6.1 cm (previously 5.0 x 8.5 cm). The right ovary measured 3.8 x 4.9 cm  (previously 4.7 x 5.6 cm).  There was improved peritoneal carcinomatosis.  There was a small amount of ascites.  The hepatic dome lesion was stable. The previously seen right hepatic lobe lesion was not well-visualized.  There was a small left pleural effusion and trace right pleural effusion.  The nodular lesion at the ampulla of Vater, extending into the duodenum  was stable.  There was no biliary ductal dilatation. 08/01/2015:  Rght upper extremity ultrasound revealed a near occlusive thrombus within the central portion of the right internal jugular vein and central portion of the right subclavian vein. 09/01/2015:  Chest, abdomen, and pelvic CT revealed continued decrease in perihepatic fluid collection contiguous with the right pleural space with percutaneous drain.  09/11/2015:  Chest, abdomen, and pelvic CT revealed a residual versus recurrent fluid collection posterior to the right hepatic lobe (2.6 x 1.1 x 4.0 cm).   10/13/2015:  Chest, abdomen, and pelvic CT revealed resolution of the empyema. 02/15/2016:  Chest, abdomen, and pelvic CT revealed multiple soft tissue nodules throughout the pelvis, small bowel mesenteric and peritoneum and, concerning for peritoneal metastatic disease.  There was soft tissue irregularity within the left upper quadrant.  There was mild right hydronephrosis (etiology unclear).  There was interval resolution of previously described right pleural based fluid and gas collection. There was a pleural based nodule within the right lower hemi-thorax concerning for pleural based metastasis.  There were small bilateral pleural effusions.  There was pulmonary nodularity, predominately within the left upper lobe (metastatic or infectious/inflammatory etiology).  There was slightly increased mediastinal adenopathy (infectious/inflammatory or metastatic).  There was a low attenuation lesion within the left hepatic lobe (complicated fluid within the fissure or metastatic  disease). 03/08/2016:  Abdomen and pelvic CT revealed mild progression of peritoneal disease in the abdomen.  There was interval increase in loculated fluid around the lateral segment left liver, stomach, and spleen.  There was persistent soft tissue lesion at the level of the ampulla with mild intra and extrahepatic biliary duct dilatation.  There was persistent intrahepatic and capsular metastatic disease involving the liver. 05/26/2016:  Head MRI revealed multiple small areas of acute infarct involving the occipital parietal lobe bilaterally and the left lateral cerebellum,  consistent with posterior circulation emboli.  06/10/2016:  Adomen and pelvic CT revealed progression of peritoneal carcinomatosis predominantly in the perihepatic space, bilateral lower quadrants and pelvis.  There was worsening bilateral obstructive uropathy due to malignant involvement of the pelvic ureters.  There was stable small pleural/subpleural nodules at the right lung base.  There was stable small left pleural effusion.  There was decreased small volume perihepatic ascites.  08/09/2016:  Abdomen and pelvic CT revealed interval increase and loculated fluid collections within the peritoneal space consistent with progression of peritoneal ovarian carcinoma metastasis.  There was one large 7 cm collection in the RIGHT lower quadrant which had increased significantly in size and may represent of point of small bowel obstruction.  The proximal stomach and duodenum were decompressed. The mid small bowel was mildly dilated. There was concern for obstruction given the large intraperitoneal fluid collection.  There was increase in subcapsular hepatic metastatic fluid collections.  There was enlargement of rounded ampullary lesion in the second portion duodenum concerning for metastatic lesion.  There was mild biliary duct dilatation (stable).  There was nodular pleural metastasis in the RIGHT lower lobe pleural space. 11/03/2016:   Abdomen and pelvic CT revealed multifocal cystic metastases in the abdomen/pelvis, reflecting peritoneal disease.  The dominant lesion in the left anterior abdomen has progressed, while additional lesions were mixed.  Peritoneal disease along the liver and spleen progressed.  Pleural-based metastases along the posterior right hemithorax progressed. There were trace left pleural effusion.  There was a stable 1.7 cm duodenal lesion at the ampulla. 11/29/2016:  Abdomen and pelvic CT revealed mild interval enlargement of pleural metastasis the  RIGHT lower lobe.  There was overall mild decrease in cystic peritoneal metastasis within the abdomen pelvis. The solid lesion at the terminal ileum was decreased in size. Subcapsular lesion in the liver was slightly decreased in size.  There was no evidence of disease progression the abdomen pelvis.  There was mild intrahepatic duct dilatation similar prior.  The ampullary lesion was slightly larger.  There was mild hydronephrosis and hydroureter on the LEFT likely related to cystic metastasis at the LEFT vesicoureteral junction 12/09/2016:  MRCP revealed the cystic lesion within the pancreatic head/uncinate process had decreased in size and demonstrated no suspicious characteristics. This can be presumed a pseudocyst or focus of side branch duct ectasia.  There was similar to slight progression of extensive peritoneal metastasis since 11/29/2016. Grossly similar abdominal nodal and right pleural metastasis.  There was chronic mild common duct dilatation with similar soft tissue fullness about the ampulla compared to 11/29/2016.  There was improved to resolved left-sided hydronephrosis. 01/20/2017:  Abdomen and pelvic CT revealed complex mixed interval changes in widespread metastatic disease in the peritoneal cavity, lower chest and thoracic, abdominal and pelvic lymph nodes. Right lower chest pleural metastasis and several of the peritoneal tumor implants had increased.   There was new right inguinal lymphadenopathy.  Some of the peritoneal tumor implants had decreased.  The small dependent left pleural effusion was slightly increased .  The mild left hydroureteronephrosis was improved.  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   The chronic biliary ductal dilatation was stable .  There was no evidence of bowel obstruction.  Admissions: [place] from 06/15/2015 - 06/22/2015.  She underwent exploratory laparotomy, lysis of adhesions, total abdominal hysterectomy with bilateral salpingo-oophorectomy, infracolic omentectomy, optimal tumor debulking(< 1 cm), recto-sigmoid resection with creation of end colostomy, cholecystectomy, mobilization of splenic flexure and liver with diaphragmatic stripping on 06/15/2015. The right diaphragm was cleared of tumor. During dissection, the diaphragm was entered and closed with sutures.  [place] from 07/01/2015 - 07/03/2015 with pleural effusion. [place] from 07/07/2015 - 07/09/2015 with recurrent pleural effusion. [place] from 07/29/2015 - 08/10/2015 with a right-sided empyema and liver abscess. She underwent CT-guided placement of a liver abscess drain on 07/30/2015.  Liver abscess culture grew out group B strep and Enterobacter which was sensitive to Zosyn. She was transitioned to ertapenem Colbert Ewing) prior to discharge.  She was readmitted to [facility] on 09/17/2015. 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  Head MRI on 05/26/2016 revealed multiple small areas of acute infarct involving the occipital parietal lobe bilaterally and the left lateral cerebellum,  consistent with posterior circulation emboli.  Work-up included a negative carotid ultrasound and echocardiogram. [facility] from 08/10/2016 - 08/14/2016 with a small bowel obstruction.  She was managed conservatively. [place] from 09/27/2016 - 09/28/2016 with symptomatic anemia and diarrhea.   Infusion on 01/23/2017  Component Date Value Ref Range Status  .  WBC 01/23/2017 3.9  3.6 - 11.0 K/uL Final  . RBC 01/23/2017 3.70* 3.80 - 5.20 MIL/uL Final  . Hemoglobin 01/23/2017 10.8* 12.0 - 16.0 g/dL Final  . HCT 01/23/2017 31.1* 35.0 - 47.0 % Final  . MCV 01/23/2017 84.2  80.0 - 100.0 fL Final  . MCH 01/23/2017 29.1  26.0 - 34.0 pg Final  . MCHC 01/23/2017 34.6  32.0 - 36.0 g/dL Final  . RDW 01/23/2017 17.2* 11.5 - 14.5 % Final  . Platelets 01/23/2017 202  150 - 440 K/uL Final  . Neutrophils Relative % 01/23/2017 60  %  Final  . Neutro Abs 01/23/2017 2.3  1.4 - 6.5 K/uL Final  . Lymphocytes Relative 01/23/2017 22  % Final  . Lymphs Abs 01/23/2017 0.8* 1.0 - 3.6 K/uL Final  . Monocytes Relative 01/23/2017 16  % Final  . Monocytes Absolute 01/23/2017 0.6  0.2 - 0.9 K/uL Final  . Eosinophils Relative 01/23/2017 1  % Final  . Eosinophils Absolute 01/23/2017 0.0  0 - 0.7 K/uL Final  . Basophils Relative 01/23/2017 1  % Final  . Basophils Absolute 01/23/2017 0.0  0 - 0.1 K/uL Final  . Sodium 01/23/2017 134* 135 - 145 mmol/L Final  . Potassium 01/23/2017 3.9  3.5 - 5.1 mmol/L Final  . Chloride 01/23/2017 98* 101 - 111 mmol/L Final  . CO2 01/23/2017 29  22 - 32 mmol/L Final  . Glucose, Bld 01/23/2017 222* 65 - 99 mg/dL Final  . BUN 01/23/2017 21* 6 - 20 mg/dL Final  . Creatinine, Ser 01/23/2017 0.89  0.44 - 1.00 mg/dL Final  . Calcium 01/23/2017 9.1  8.9 - 10.3 mg/dL Final  . Total Protein 01/23/2017 7.1  6.5 - 8.1 g/dL Final  . Albumin 01/23/2017 2.9* 3.5 - 5.0 g/dL Final  . AST 01/23/2017 17  15 - 41 U/L Final  . ALT 01/23/2017 11* 14 - 54 U/L Final  . Alkaline Phosphatase 01/23/2017 84  38 - 126 U/L Final  . Total Bilirubin 01/23/2017 0.5  0.3 - 1.2 mg/dL Final  . GFR calc non Af Amer 01/23/2017 >60  >60 mL/min Final  . GFR calc Af Amer 01/23/2017 >60  >60 mL/min Final   Comment: (NOTE) The eGFR has been calculated using the CKD EPI equation. This calculation has not been validated in all clinical situations. eGFR's persistently <60 mL/min  signify possible Chronic Kidney Disease.   . Anion gap 01/23/2017 7  5 - 15 Final  . Magnesium 01/23/2017 1.3* 1.7 - 2.4 mg/dL Final    Assessment:  [name] is a 71 y.o. female with progressive stage IV ovarian cancer.  She presented with abdominal discomfort and bloating.  Omental biopsy on 02/23/2015 revealed metastatic high grade serous carcinoma, consistent with gynecologic origin.   She was initially diagnosed with clinical stage IIIC (T3cN1Mx).  CA125 was 707 on 02/17/2015.  She received 4 cycles of neoadjuvant carboplatin and Taxol (03/05/2015 - 05/22/2015).  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  She had loose stools on oral magnesium.  She was initially on Neurontin then switched Lyrica with cycle #3.  Cycle #4 was notable for neutropenia (ANC 300) requiring GCSF x 3 days.    She underwent exploratory laparotomy, lysis of adhesions, total abdominal hysterectomy with bilateral salpingo-oophorectomy, infracolic omentectomy, optimal tumor debulking(< 1 cm), recto-sigmoid resection with creation of end colostomy, cholecystectomy, mobilization of splenic flexure and liver with diaphragmatic stripping on 06/15/2015. The right diaphragm was cleared of tumor. During dissection, the diaphragm was entered and closed with sutures.  She received tamoxifen from 02/25/2016 - 03/14/2016.  She received 4 cycles of carboplatin and gemcitabine (03/21/2016 - 05/24/2016) with GCSF/Neulasta support.  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  PDL-1 testing revealed a combined score was 5 (>=1 positive).     She received 2 cycles of Doxil (06/21/2016 - 07/19/2016) with Neulasta support.  She received 4 cycles of topotecan with Neulasta support (08/22/2016 - 10/10/2016).    She received 3 cycles of pembrolizumab (Keytruda) from 12/06/2016 -  01/09/2017.  Cycle #1 was complicated by neutropenia (  ANC 400) requiring GCSF/Granix x 3 days.  She received GCSF x 5 days with cycle  #2.  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 Right lower chest pleural metastasis and several of the peritoneal tumor implants had increased.  There was new right inguinal lymphadenopathy.  Some of the peritoneal tumor implants had decreased.  The small dependent left pleural effusion was slightly increased .  The mild left hydroureteronephrosis was improved.  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   The chronic biliary ductal dilatation was stable .  There was no evidence of bowel obstruction.  CA125 was 802.9 on 03/30/2015, 567.9 on 04/13/2015, 168.8 on 05/15/2015, 85.2 on 07/17/2015, 68.6 on 07/28/2015, 34.5 on 09/17/2015, 20.7 on 11/06/2015, 49 on 01/22/2016, 106.1 on 02/22/2016, 138.8 on 03/07/2016, 220.8 on 03/21/2016, 152.8 on 04/11/2016, 125.6 on 04/18/2016, 76.8 on 05/03/2016, 60.2 on 05/24/2016, 44 on 07/19/2016, 65.8 on 08/17/2016, 53.4 on 09/12/2016, 47.3 on 10/10/2016, 91 on 12/20/2016, and 141.1 on 01/09/2017.  She has a history of recurrent right sided pleural effusion.  She underwent thoracentesis of 650 cc on post-operative day 3.  She was admitted to [facility] on 07/01/2015 and 07/07/2015 for recurrent shortness of breath.  She underwent 2 additional thoracenteses (1.1 L on 07/02/2015 and 850 cc on 07/08/2015).  Cytology was negative x 2.  Bilateral lower extremity duplex on 07/03/2015 was negative.  Echo revealed an EF of 55-60% on 07/08/2015 and 60-65% on 05/27/2016.    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 She was on Lovenox 60 mg twice a day.  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  She has severe rheumatoid arthritis.  Methotrexate and  Enbrel were initially on hold.  She has a normocytic anemia.  Work-up on 02/17/2015 and 05/24/2016 revealed a normal ferritin, B12, folate, TSH.  She denies any melena or hematochezia.    She has anemia due to chronic disease. She received 1 unit PRBCs during her admission at [facility]. She denies any melena or hematochezia. She has diabetes and is on an insulin pump.  She has had persistent neutropenia felt secondary to her rheumatoid arthritis.  Folate and MMA were normal.  TSH was 6.13 (high) with a free T4 of 1.17 (0.61-1.12).  She began methotrexate (10 mg a week) and prednisone (5 mg a day) for severe rheumatoid arthritis on 12/17/2015.  She remains on prednisone alone (5 mg a day).  Bone marrow aspirate and biopsy on 06/09/2010 revealed a hypercellular marrow (70%) with no evidence of dysplasia or malignancy.  Flow cytometry was negative.  Cytogenetics were normal (46,XX).  FISH studies were negative for MDS.  Bone marrow aspirate and biopsy on 12/03/2015 revealed a normocellular to mildy hypercellular marrow for age (40%) with left shifted myelopoiesis, non specific dyserythropoiesis and mild megakaryocytic atypia with no increase in blasts.  There were multiple small nonspecific lymphoid aggregates (favor reactive). There was no increase in reticulin.  There was decreased myeloid cells (37%) with left shifted maturation and 1% atypical myelod blasts.  There was relatively increased monocytic cells (11%), relatively increased lymphoid cells (36%), and relatively increased eosinophils (6%).  Cytogenetics were normal (73, XX).  SNP microarray was normal.  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  She receives IV magnesium weekly.  She has chemotherapy induced anemia.  She has received Procrit (last 11/07/2016).  Labs on 08/22/2016 revealed a normal B12 and folate.  Ferritin was 37, iron saturation 11% and TIBC 281.  Code status is DNR/DNI.  She developed acute LFT elevation on  11/28/2016.  Event was preceded by upper abdominal discomfort.  Hepatitis B core antibody total and hepatitis C antibody were negative.  Symptomatically, she feels "great".  Exam is stable.  She has chemotherapy induced anemia. WBC 3900 with an ANC of 2300. Hemoglobin is 10.8, hematocrit 31.1, and platelets 202,000.  Magnesium is 1.3 (low).  Plan: 1.  Labs today:  CBC with diff, CMP, Mg, CA125. 2.  Discuss restaging scans that were done on 01/20/2017- mixed response.  Discuss clinical trail enrollment. She is willing to travel. Will communicate with patient once we hear from clinical research about potential open trials. 3.  Discuss treatment with Abraxane (2-3 weeks on and 1 week off) if no clinical trial options. 4.  Preauthorize Abraxane. 5.  Magnesium 4 gram IV today. 6.  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  7.  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   8.  RTC in 1 week for labs (CBC with diff, BMP, Mg), +/- Mg.   Melissa C. Mike Gip, MD 01/23/2017, 9:57 AM

## 2017-01-26 ENCOUNTER — Ambulatory Visit: Payer: Medicare Other | Admitting: Family Medicine

## 2017-01-28 ENCOUNTER — Encounter: Payer: Self-pay | Admitting: Hematology and Oncology

## 2017-01-30 ENCOUNTER — Ambulatory Visit: Payer: Medicare Other | Admitting: Hematology and Oncology

## 2017-01-30 ENCOUNTER — Inpatient Hospital Stay: Payer: Medicare Other

## 2017-01-30 ENCOUNTER — Inpatient Hospital Stay: Payer: Medicare Other | Attending: Hematology and Oncology

## 2017-01-30 ENCOUNTER — Other Ambulatory Visit: Payer: Self-pay | Admitting: Urgent Care

## 2017-01-30 DIAGNOSIS — Z794 Long term (current) use of insulin: Secondary | ICD-10-CM | POA: Diagnosis not present

## 2017-01-30 DIAGNOSIS — C786 Secondary malignant neoplasm of retroperitoneum and peritoneum: Secondary | ICD-10-CM | POA: Diagnosis not present

## 2017-01-30 DIAGNOSIS — C569 Malignant neoplasm of unspecified ovary: Secondary | ICD-10-CM

## 2017-01-30 DIAGNOSIS — Z7952 Long term (current) use of systemic steroids: Secondary | ICD-10-CM | POA: Diagnosis not present

## 2017-01-30 DIAGNOSIS — C782 Secondary malignant neoplasm of pleura: Secondary | ICD-10-CM | POA: Insufficient documentation

## 2017-01-30 DIAGNOSIS — C778 Secondary and unspecified malignant neoplasm of lymph nodes of multiple regions: Secondary | ICD-10-CM | POA: Diagnosis not present

## 2017-01-30 DIAGNOSIS — I252 Old myocardial infarction: Secondary | ICD-10-CM | POA: Diagnosis not present

## 2017-01-30 DIAGNOSIS — Z7901 Long term (current) use of anticoagulants: Secondary | ICD-10-CM | POA: Insufficient documentation

## 2017-01-30 DIAGNOSIS — K219 Gastro-esophageal reflux disease without esophagitis: Secondary | ICD-10-CM | POA: Diagnosis not present

## 2017-01-30 DIAGNOSIS — E559 Vitamin D deficiency, unspecified: Secondary | ICD-10-CM | POA: Insufficient documentation

## 2017-01-30 DIAGNOSIS — E785 Hyperlipidemia, unspecified: Secondary | ICD-10-CM | POA: Diagnosis not present

## 2017-01-30 DIAGNOSIS — Z9221 Personal history of antineoplastic chemotherapy: Secondary | ICD-10-CM | POA: Diagnosis not present

## 2017-01-30 DIAGNOSIS — Z79899 Other long term (current) drug therapy: Secondary | ICD-10-CM | POA: Diagnosis not present

## 2017-01-30 DIAGNOSIS — Z90710 Acquired absence of both cervix and uterus: Secondary | ICD-10-CM | POA: Insufficient documentation

## 2017-01-30 DIAGNOSIS — I25810 Atherosclerosis of coronary artery bypass graft(s) without angina pectoris: Secondary | ICD-10-CM | POA: Insufficient documentation

## 2017-01-30 DIAGNOSIS — M069 Rheumatoid arthritis, unspecified: Secondary | ICD-10-CM | POA: Diagnosis not present

## 2017-01-30 DIAGNOSIS — E11621 Type 2 diabetes mellitus with foot ulcer: Secondary | ICD-10-CM | POA: Insufficient documentation

## 2017-01-30 DIAGNOSIS — C561 Malignant neoplasm of right ovary: Secondary | ICD-10-CM

## 2017-01-30 DIAGNOSIS — Z90722 Acquired absence of ovaries, bilateral: Secondary | ICD-10-CM | POA: Diagnosis not present

## 2017-01-30 DIAGNOSIS — I509 Heart failure, unspecified: Secondary | ICD-10-CM | POA: Insufficient documentation

## 2017-01-30 DIAGNOSIS — I110 Hypertensive heart disease with heart failure: Secondary | ICD-10-CM | POA: Insufficient documentation

## 2017-01-30 LAB — BASIC METABOLIC PANEL
Anion gap: 8 (ref 5–15)
BUN: 20 mg/dL (ref 6–20)
CO2: 27 mmol/L (ref 22–32)
Calcium: 9.3 mg/dL (ref 8.9–10.3)
Chloride: 99 mmol/L — ABNORMAL LOW (ref 101–111)
Creatinine, Ser: 0.77 mg/dL (ref 0.44–1.00)
GFR calc Af Amer: >60 mL/min (ref >60)
GFR calc non Af Amer: >60 mL/min (ref >60)
Glucose, Bld: 185 mg/dL — ABNORMAL HIGH (ref 65–99)
Potassium: 3.8 mmol/L (ref 3.5–5.1)
Sodium: 134 mmol/L — ABNORMAL LOW (ref 135–145)

## 2017-01-30 LAB — CBC WITH DIFFERENTIAL/PLATELET
Basophils Absolute: 0 10*3/uL (ref 0–0.1)
Basophils Relative: 1 %
Eosinophils Absolute: 0 10*3/uL (ref 0–0.7)
Eosinophils Relative: 2 %
HCT: 30.9 % — ABNORMAL LOW (ref 35.0–47.0)
Hemoglobin: 10.7 g/dL — ABNORMAL LOW (ref 12.0–16.0)
Lymphocytes Relative: 24 %
Lymphs Abs: 0.7 10*3/uL — ABNORMAL LOW (ref 1.0–3.6)
MCH: 29.1 pg (ref 26.0–34.0)
MCHC: 34.8 g/dL (ref 32.0–36.0)
MCV: 83.9 fL (ref 80.0–100.0)
Monocytes Absolute: 0.4 10*3/uL (ref 0.2–0.9)
Monocytes Relative: 12 %
Neutro Abs: 1.8 10*3/uL (ref 1.4–6.5)
Neutrophils Relative %: 61 %
Platelets: 223 10*3/uL (ref 150–440)
RBC: 3.68 MIL/uL — ABNORMAL LOW (ref 3.80–5.20)
RDW: 16.7 % — ABNORMAL HIGH (ref 11.5–14.5)
WBC: 2.9 10*3/uL — ABNORMAL LOW (ref 3.6–11.0)

## 2017-01-30 LAB — MAGNESIUM: Magnesium: 1.3 mg/dL — ABNORMAL LOW (ref 1.7–2.4)

## 2017-01-30 MED ORDER — MAGNESIUM SULFATE 4 GM/100ML IV SOLN
4.0000 g | Freq: Once | INTRAVENOUS | Status: AC
  Administered 2017-01-30: 4 g via INTRAVENOUS
  Filled 2017-01-30: qty 100

## 2017-01-30 MED ORDER — HEPARIN SOD (PORK) LOCK FLUSH 100 UNIT/ML IV SOLN
500.0000 [IU] | Freq: Once | INTRAVENOUS | Status: AC
  Administered 2017-01-30: 500 [IU] via INTRAVENOUS
  Filled 2017-01-30: qty 5

## 2017-01-31 ENCOUNTER — Other Ambulatory Visit: Payer: Self-pay | Admitting: Hematology and Oncology

## 2017-01-31 NOTE — Progress Notes (Signed)
DISCONTINUE ON PATHWAY REGIMEN - Ovarian     Administer weekly:     Paclitaxel   **Always confirm dose/schedule in your pharmacy ordering system**    REASON: Toxicities / Adverse Event PRIOR TREATMENT: [id number]: Paclitaxel 80 mg/m2 Weekly (D1, 8, 15, 22 q28 Days); Re-evaluate Every 3 Cycles, Treat Until Complete Response, Unacceptable Toxicity, or Disease Progression TREATMENT RESPONSE: Unable to Evaluate  START OFF PATHWAY REGIMEN - Ovarian   [id number]:Nab-Paclitaxel (Abraxane(R)) 100 mg/m2 Days 1, 8, and 15 q21 days:   A cycle is 21 days:     Nab-paclitaxel (protein bound)   **Always confirm dose/schedule in your pharmacy ordering system**    Patient Characteristics: Recurrent or Progressive Disease, Fourth Line and Beyond, BRCA Mutation Absent/Unknown AJCC T Category: Staged < 8th Ed. AJCC N Category: Staged < 8th Ed. AJCC M Category: Staged < 8th Ed. Therapeutic Status: Recurrent or Progressive Disease AJCC 8 Stage Grouping: Staged < 8th Ed. Line of Therapy: Fourth Line and Beyond BRCA Mutation Status: Absent Would you be surprised if this patient died  in the next year<= I would be surprised if this patient died in the next year Intent of Therapy: Non-Curative / Palliative Intent, Discussed with Patient

## 2017-02-06 ENCOUNTER — Inpatient Hospital Stay: Payer: Medicare Other

## 2017-02-06 DIAGNOSIS — Z7189 Other specified counseling: Secondary | ICD-10-CM

## 2017-02-06 DIAGNOSIS — C561 Malignant neoplasm of right ovary: Secondary | ICD-10-CM

## 2017-02-06 DIAGNOSIS — C801 Malignant (primary) neoplasm, unspecified: Secondary | ICD-10-CM

## 2017-02-06 DIAGNOSIS — C569 Malignant neoplasm of unspecified ovary: Secondary | ICD-10-CM | POA: Diagnosis not present

## 2017-02-06 DIAGNOSIS — D61818 Other pancytopenia: Secondary | ICD-10-CM

## 2017-02-06 DIAGNOSIS — I82C11 Acute embolism and thrombosis of right internal jugular vein: Secondary | ICD-10-CM

## 2017-02-06 DIAGNOSIS — C800 Disseminated malignant neoplasm, unspecified: Principal | ICD-10-CM

## 2017-02-06 DIAGNOSIS — C786 Secondary malignant neoplasm of retroperitoneum and peritoneum: Secondary | ICD-10-CM

## 2017-02-06 LAB — CBC WITH DIFFERENTIAL/PLATELET
Basophils Absolute: 0.1 10*3/uL (ref 0–0.1)
Basophils Relative: 2 %
EOS PCT: 1 %
Eosinophils Absolute: 0 10*3/uL (ref 0–0.7)
HCT: 31 % — ABNORMAL LOW (ref 35.0–47.0)
Hemoglobin: 10.4 g/dL — ABNORMAL LOW (ref 12.0–16.0)
LYMPHS ABS: 0.6 10*3/uL — AB (ref 1.0–3.6)
LYMPHS PCT: 25 %
MCH: 28.5 pg (ref 26.0–34.0)
MCHC: 33.6 g/dL (ref 32.0–36.0)
MCV: 84.9 fL (ref 80.0–100.0)
MONOS PCT: 23 %
Monocytes Absolute: 0.5 10*3/uL (ref 0.2–0.9)
Neutro Abs: 1.1 10*3/uL — ABNORMAL LOW (ref 1.4–6.5)
Neutrophils Relative %: 49 %
PLATELETS: 189 10*3/uL (ref 150–440)
RBC: 3.66 MIL/uL — AB (ref 3.80–5.20)
RDW: 16.9 % — ABNORMAL HIGH (ref 11.5–14.5)
WBC: 2.3 10*3/uL — AB (ref 3.6–11.0)

## 2017-02-06 LAB — MAGNESIUM: Magnesium: 1.3 mg/dL — ABNORMAL LOW (ref 1.7–2.4)

## 2017-02-06 MED ORDER — SODIUM CHLORIDE 0.9% FLUSH
10.0000 mL | Freq: Once | INTRAVENOUS | Status: AC
Start: 2017-02-06 — End: 2017-02-06
  Administered 2017-02-06: 10 mL via INTRAVENOUS
  Filled 2017-02-06: qty 10

## 2017-02-06 MED ORDER — SODIUM CHLORIDE 0.9 % IV SOLN
INTRAVENOUS | Status: DC
  Administered 2017-02-06: 10:00:00 via INTRAVENOUS
  Filled 2017-02-06: qty 1000

## 2017-02-06 MED ORDER — MAGNESIUM SULFATE 4 GM/100ML IV SOLN
4.0000 g | Freq: Once | INTRAVENOUS | Status: AC
  Administered 2017-02-06: 4 g via INTRAVENOUS
  Filled 2017-02-06: qty 100

## 2017-02-06 MED ORDER — HEPARIN SOD (PORK) LOCK FLUSH 100 UNIT/ML IV SOLN
500.0000 [IU] | Freq: Once | INTRAVENOUS | Status: AC
  Administered 2017-02-06: 500 [IU] via INTRAVENOUS
  Filled 2017-02-06: qty 5

## 2017-02-06 MED ORDER — SODIUM CHLORIDE 0.9% FLUSH
10.0000 mL | Freq: Once | INTRAVENOUS | Status: AC
  Administered 2017-02-06: 10 mL via INTRAVENOUS
  Filled 2017-02-06: qty 10

## 2017-02-06 NOTE — Progress Notes (Unsigned)
[facility] day:  02/06/2017   Chief Complaint: [name] is a 71 y.o. female with stage IV ovarian cancer who is seen for review of interval restaging scans and assessment prior to cycle #4 pembrolizumab.  HPI:  The patient was last seen in the medical oncology clinic on 01/23/2017.  At that time, she felt great.  Exam was stable.  She had chemotherapy induced anemia. WBC was3900 with an Tiger of 2300. Hemoglobin was 10.8, hematocrit 31.1, and platelets 202,000.  Magnesium was 1.3 (low).  PET scan revealed a mixed response.  We discussed potential clinical trial enrollment.  She was referred to Dr. Manuella Ghazi for her neuropathy.    Past Medical History:  Diagnosis Date  . CAD (coronary artery disease)    a. 09/2012 Cath: LM nl, LAD 95p, 45m LCX 966mOM2 50, RCA 100.  . Marland KitchenHF (congestive heart failure) ([facility]  . Cholelithiasis   . Collagen vascular disease (HCC)    Rheumatoid Arthritis.  . Esophageal stricture   . Exertional shortness of breath   . GERD (gastroesophageal reflux disease)   . H/O hiatal hernia   . Herniated disc   . History of pancytopenia   . Hyperlipidemia   . Hypertension   . Hypokalemia   . Leukopenia 2012   s/p bone marrow biopsy, Dr. PaMa Hillock. NSTEMI (non-ST elevated myocardial infarction) (HCWillits5/2014   "mild" (10/18/2012)  . Ovarian cancer ([facility]ell2016   chemo and hysterectomy  . Pneumonia 2013; 08/2012   "one lung; double" (10/18/2012)  . Rheumatoid arthritis(714.0)   . Type I diabetes mellitus ([facility]   "dx'd in 1957" (10/18/2012)    Past Surgical History:  Procedure Laterality Date  . ABDOMINAL HYSTERECTOMY  06/15/2015   Dx L/S, EXLAP TAH BSO omentectomy RSRx colostomy diaphragm resection stripping  . CARDIAC CATHETERIZATION  10/18/2012   "first one was today" (10/18/2012)  . CATARACT EXTRACTION W/ INTRAOCULAR LENS IMPLANT Right 2010  . CHOLECYSTECTOMY  06/15/2015   combined case with ovarian cancer debulking  .  COLON SURGERY    . CORONARY ARTERY BYPASS GRAFT N/A 10/19/2012   Procedure: CORONARY ARTERY BYPASS GRAFTING (CABG);  Surgeon: StMelrose NakayamaMD;  [place] [facility] Service: Open Heart Surgery;  Laterality: N/A;  . ESOPHAGEAL DILATION     "3 or 4 times" (10/19/2011)  . ESOPHAGOGASTRODUODENOSCOPY  2012   Dr. IfMinna Merritts. OSTOMY    . OVARY SURGERY     removal  . PERIPHERAL VASCULAR CATHETERIZATION N/A 03/02/2015   Procedure: PoGlori Luisath Insertion;  Surgeon: JaAlgernon HuxleyMD;  [facility];  Service: Cardiovascular;  Laterality: N/A;  . TUBAL LIGATION  1970    Family History  Problem Relation Age of Onset  . Diabetes Mother   . Arthritis Mother   . Diabetes Father   . Arthritis Father   . Aneurysm Sister        neck  . Cancer Sister        lung    Social History:  reports that she quit smoking about 22 years ago. Her smoking use included Cigarettes. She has a 30.00 pack-year smoking history. She has never used smokeless tobacco. She reports that she does not drink alcohol or use drugs.  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 She is accompanied by her husband today.  Allergies:  Allergies  Allergen Reactions  . Codeine Nausea  And Vomiting  . Latex Rash    Current Medications: Current Outpatient Prescriptions  Medication Sig Dispense Refill  . Calcium-Vitamin D 600-200 MG-UNIT tablet Take 1 tablet by mouth 2 (two) times daily.     . cholecalciferol (VITAMIN D) 1000 units tablet Take 1,000 Units by mouth daily.    Marland Kitchen enoxaparin (LOVENOX) 60 MG/0.6ML injection INJECT 0.6 ML (60 MG TOTAL) INTO THE SKIN EVERY 12 HOURS 60 mL 2  . folic acid (FOLVITE) 1 MG tablet Take 1 tablet (1 mg total) by mouth daily. 90 tablet 3  . furosemide (LASIX) 80 MG tablet Take 0.5 tablets by mouth 2 (two) times daily.    . insulin lispro (HUMALOG) 100 UNIT/ML injection Inject 0.06 mLs (6 Units total) into the skin daily. (Patient taking  differently: Inject 0-85 Units into the skin daily. Via insulin pump) 30 mL 3  . lidocaine-prilocaine (EMLA) cream APPLY 1 APPLICATION TOPICALLY AS NEEDED 1 HOUR PRIOR TO TREATMENT. COVER WITH PRESS N SEAL UNTIL TREATMENT TIME AS DIRECTED 30 g 1  . loratadine (CLARITIN) 10 MG tablet Take 1 tablet by mouth daily as needed.    . magnesium oxide (MAG-OX) 400 (241.3 Mg) MG tablet TAKE 1 TABLET BY MOUTH ONCE DAILY 30 tablet 1  . metoprolol tartrate (LOPRESSOR) 25 MG tablet Take 1 tablet (25 mg total) by mouth 2 (two) times daily. 180 tablet 3  . ondansetron (ZOFRAN) 8 MG tablet TAKE 1 TABLET BY MOUTH EVERY 8 HOURS AS NEEDED FOR NAUSEA OR VOMITING (Patient not taking: Reported on 01/23/2017) 30 tablet 1  . pantoprazole (PROTONIX) 40 MG tablet TAKE 1 TABLET BY MOUTH TWICE (2) DAILY 180 tablet 1  . Pembrolizumab (KEYTRUDA IV) Inject into the vein every 14 (fourteen) days.    . potassium chloride SA (K-DUR,KLOR-CON) 20 MEQ tablet TAKE 1 TABLET BY MOUTH ONCE DAILY 30 tablet 0  . predniSONE (DELTASONE) 5 MG tablet Take 5 mg by mouth daily with breakfast.    . vitamin C (ASCORBIC ACID) 500 MG tablet Take 500 mg by mouth daily.    Marland Kitchen zinc gluconate 50 MG tablet Take 50 mg by mouth daily.     Current Facility-Administered Medications  Medication Dose Route Frequency Provider Last Rate Last Dose  . Tbo-Filgrastim (GRANIX) injection 300 mcg  300 mcg Subcutaneous Once Lequita Asal, MD       Facility-Administered Medications Ordered in Other Visits  Medication Dose Route Frequency Provider Last Rate Last Dose  . sodium chloride 0.9 % injection 10 mL  10 mL Intracatheter PRN Corcoran, Melissa C, MD      . sodium chloride 0.9 % injection 10 mL  10 mL Intravenous PRN Lequita Asal, MD   10 mL at 04/13/15 5329    Review of Systems:  GENERAL:  Feeling "great".  No fever, chills or sweats.  Weight up 1 pound. PERFORMANCE STATUS (ECOG):  1 HEENT:  No visual changes, sore throat, mouth sores or  tenderness. Lungs:  No shortness of breath.  No cough.  No hemoptysis. Cardiac:  No chest pain, palpitations, orthopnea, or PND. GI:  No nausea, vomiting, diarrhea, melena or hematochezia. GU:  No urgency, frequency, dysuria, or hematuria. Musculoskeletal:  Severe rheumatoid arthritis (on prednisone).  Osteoporosis.  No back pain. No muscle tenderness. Extremities:  No pain or swelling. Skin:  No increased bruising or bleeding.  No rashes or skin changes. Neuro:  Neuropathy (stable).  No headache, numbness or weakness, balance or coordination issues. Endocrine:  Diabetes on an  insulin pump.  Blood sugar elevated on steroids.  Occasional hot flashes.  Psych:  No mood changes, depression or anxiety. Pain: No pain. Review of systems:  All other systems reviewed and found to be negative.  Physical Exam:  There were no vitals taken for this visit. GENERAL:  Well developed, well nourished woman sitting comfortably in the exam room in no acute distress. MENTAL STATUS:  Alert and oriented to person, place and time. HEAD:  Wearing a silver cap.  Alopecia.  Normocephalic, atraumatic, face symmetric, no Cushingoid features. EYES:  Gold rimmed glasses.  Blue eyes.  No conjunctivitis or scleral icterus.  ENT: Oropharynx clear without lesion. Tongue normal. Mucous membranes moist.  RESPIRATORY: Clear to auscultation without rales, wheezes or rhonchi. CARDIOVASCULAR: Regular rate and rhythm without murmur, rub or gallop. ABDOMEN: Soft, , non-tender with active bowel sounds and no hepatosplenomegaly. No palpable nodularity or masses.  Insulin pump.  Ostomy bag. SKIN: No rashes or ulcers. EXTREMITIES:  No edema, skin discoloration or tenderness. No palpable cords. LYMPH NODES: No palpable cervical, supraclavicular, axillary or inguinal adenopathy  NEUROLOGICAL: Appropriate. PSYCH: Appropriate.   Radiology studies: 02/13/2015:  Abdomen and pelvic CT revealed bilateral mass-like adnexal  regions (right adnexal mass 5.6 x 5.0 cm and the left adnexa mass 10.3 x 6.0 x 9.9 cm).  There was a large amount of soft tissue throughout the peritoneal cavity involving the omentum and other peritoneal surfaces.  There was a small volume ascites. There was a peripheral 3.3 x 1.9 cm low-attenuation lesion overlying the right lobe of the liver, likely representing a serosal implant. There was 1.4 x 2.2 cm ill-defined peripheral lesion within the inferior aspect of segment 6 adjacent to the ampulla in the duodenum.  04/28/2015:  Abdominal and pelvic CT revealed decreasing bilateral ovarian masses.   The left adnexal mass measured 4.0 x 6.1 cm (previously 5.0 x 8.5 cm). The right ovary measured 3.8 x 4.9 cm (previously 4.7 x 5.6 cm).  There was improved peritoneal carcinomatosis.  There was a small amount of ascites.  The hepatic dome lesion was stable. The previously seen right hepatic lobe lesion was not well-visualized.  There was a small left pleural effusion and trace right pleural effusion.  The nodular lesion at the ampulla of Vater, extending into the duodenum was stable.  There was no biliary ductal dilatation. 08/01/2015:  Rght upper extremity ultrasound revealed a near occlusive thrombus within the central portion of the right internal jugular vein and central portion of the right subclavian vein. 09/01/2015:  Chest, abdomen, and pelvic CT revealed continued decrease in perihepatic fluid collection contiguous with the right pleural space with percutaneous drain.  09/11/2015:  Chest, abdomen, and pelvic CT revealed a residual versus recurrent fluid collection posterior to the right hepatic lobe (2.6 x 1.1 x 4.0 cm).   10/13/2015:  Chest, abdomen, and pelvic CT revealed resolution of the empyema. 02/15/2016:  Chest, abdomen, and pelvic CT revealed multiple soft tissue nodules throughout the pelvis, small bowel mesenteric and peritoneum and, concerning for peritoneal metastatic disease.  There was soft  tissue irregularity within the left upper quadrant.  There was mild right hydronephrosis (etiology unclear).  There was interval resolution of previously described right pleural based fluid and gas collection. There was a pleural based nodule within the right lower hemi-thorax concerning for pleural based metastasis.  There were small bilateral pleural effusions.  There was pulmonary nodularity, predominately within the left upper lobe (metastatic or infectious/inflammatory etiology).  There was slightly  increased mediastinal adenopathy (infectious/inflammatory or metastatic).  There was a low attenuation lesion within the left hepatic lobe (complicated fluid within the fissure or metastatic disease). 03/08/2016:  Abdomen and pelvic CT revealed mild progression of peritoneal disease in the abdomen.  There was interval increase in loculated fluid around the lateral segment left liver, stomach, and spleen.  There was persistent soft tissue lesion at the level of the ampulla with mild intra and extrahepatic biliary duct dilatation.  There was persistent intrahepatic and capsular metastatic disease involving the liver. 05/26/2016:  Head MRI revealed multiple small areas of acute infarct involving the occipital parietal lobe bilaterally and the left lateral cerebellum,  consistent with posterior circulation emboli.  06/10/2016:  Adomen and pelvic CT revealed progression of peritoneal carcinomatosis predominantly in the perihepatic space, bilateral lower quadrants and pelvis.  There was worsening bilateral obstructive uropathy due to malignant involvement of the pelvic ureters.  There was stable small pleural/subpleural nodules at the right lung base.  There was stable small left pleural effusion.  There was decreased small volume perihepatic ascites.  08/09/2016:  Abdomen and pelvic CT revealed interval increase and loculated fluid collections within the peritoneal space consistent with progression of peritoneal  ovarian carcinoma metastasis.  There was one large 7 cm collection in the RIGHT lower quadrant which had increased significantly in size and may represent of point of small bowel obstruction.  The proximal stomach and duodenum were decompressed. The mid small bowel was mildly dilated. There was concern for obstruction given the large intraperitoneal fluid collection.  There was increase in subcapsular hepatic metastatic fluid collections.  There was enlargement of rounded ampullary lesion in the second portion duodenum concerning for metastatic lesion.  There was mild biliary duct dilatation (stable).  There was nodular pleural metastasis in the RIGHT lower lobe pleural space. 11/03/2016:  Abdomen and pelvic CT revealed multifocal cystic metastases in the abdomen/pelvis, reflecting peritoneal disease.  The dominant lesion in the left anterior abdomen has progressed, while additional lesions were mixed.  Peritoneal disease along the liver and spleen progressed.  Pleural-based metastases along the posterior right hemithorax progressed. There were trace left pleural effusion.  There was a stable 1.7 cm duodenal lesion at the ampulla. 11/29/2016:  Abdomen and pelvic CT revealed mild interval enlargement of pleural metastasis the RIGHT lower lobe.  There was overall mild decrease in cystic peritoneal metastasis within the abdomen pelvis. The solid lesion at the terminal ileum was decreased in size. Subcapsular lesion in the liver was slightly decreased in size.  There was no evidence of disease progression the abdomen pelvis.  There was mild intrahepatic duct dilatation similar prior.  The ampullary lesion was slightly larger.  There was mild hydronephrosis and hydroureter on the LEFT likely related to cystic metastasis at the LEFT vesicoureteral junction 12/09/2016:  MRCP revealed the cystic lesion within the pancreatic head/uncinate process had decreased in size and demonstrated no suspicious characteristics. This  can be presumed a pseudocyst or focus of side branch duct ectasia.  There was similar to slight progression of extensive peritoneal metastasis since 11/29/2016. Grossly similar abdominal nodal and right pleural metastasis.  There was chronic mild common duct dilatation with similar soft tissue fullness about the ampulla compared to 11/29/2016.  There was improved to resolved left-sided hydronephrosis. 01/20/2017:  Abdomen and pelvic CT revealed complex mixed interval changes in widespread metastatic disease in the peritoneal cavity, lower chest and thoracic, abdominal and pelvic lymph nodes. Right lower chest pleural metastasis and several of the  peritoneal tumor implants had increased.  There was new right inguinal lymphadenopathy.  Some of the peritoneal tumor implants had decreased.  The small dependent left pleural effusion was slightly increased .  The mild left hydroureteronephrosis was improved.  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   The chronic biliary ductal dilatation was stable .  There was no evidence of bowel obstruction.  Admissions: [place] from 06/15/2015 - 06/22/2015.  She underwent exploratory laparotomy, lysis of adhesions, total abdominal hysterectomy with bilateral salpingo-oophorectomy, infracolic omentectomy, optimal tumor debulking(< 1 cm), recto-sigmoid resection with creation of end colostomy, cholecystectomy, mobilization of splenic flexure and liver with diaphragmatic stripping on 06/15/2015. The right diaphragm was cleared of tumor. During dissection, the diaphragm was entered and closed with sutures.  [place] from 07/01/2015 - 07/03/2015 with pleural effusion. [place] from 07/07/2015 - 07/09/2015 with recurrent pleural effusion. [place] from 07/29/2015 - 08/10/2015 with a right-sided empyema and liver abscess. She underwent CT-guided placement of a liver abscess drain on 07/30/2015.  Liver abscess culture grew out group B strep and Enterobacter which was sensitive to Zosyn. She was  transitioned to ertapenem Colbert Ewing) prior to discharge.  She was readmitted to [facility] on 09/17/2015. 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  Head MRI on 05/26/2016 revealed multiple small areas of acute infarct involving the occipital parietal lobe bilaterally and the left lateral cerebellum,  consistent with posterior circulation emboli.  Work-up included a negative carotid ultrasound and echocardiogram. [facility] from 08/10/2016 - 08/14/2016 with a small bowel obstruction.  She was managed conservatively. [place] from 09/27/2016 - 09/28/2016 with symptomatic anemia and diarrhea.   No visits with results within 3 Day(s) from this visit.  Latest known visit with results is:  Appointment on 01/30/2017  Component Date Value Ref Range Status  . WBC 01/30/2017 2.9* 3.6 - 11.0 K/uL Final  . RBC 01/30/2017 3.68* 3.80 - 5.20 MIL/uL Final  . Hemoglobin 01/30/2017 10.7* 12.0 - 16.0 g/dL Final  . HCT 01/30/2017 30.9* 35.0 - 47.0 % Final  . MCV 01/30/2017 83.9  80.0 - 100.0 fL Final  . MCH 01/30/2017 29.1  26.0 - 34.0 pg Final  . MCHC 01/30/2017 34.8  32.0 - 36.0 g/dL Final  . RDW 01/30/2017 16.7* 11.5 - 14.5 % Final  . Platelets 01/30/2017 223  150 - 440 K/uL Final  . Neutrophils Relative % 01/30/2017 61  % Final  . Neutro Abs 01/30/2017 1.8  1.4 - 6.5 K/uL Final  . Lymphocytes Relative 01/30/2017 24  % Final  . Lymphs Abs 01/30/2017 0.7* 1.0 - 3.6 K/uL Final  . Monocytes Relative 01/30/2017 12  % Final  . Monocytes Absolute 01/30/2017 0.4  0.2 - 0.9 K/uL Final  . Eosinophils Relative 01/30/2017 2  % Final  . Eosinophils Absolute 01/30/2017 0.0  0 - 0.7 K/uL Final  . Basophils Relative 01/30/2017 1  % Final  . Basophils Absolute 01/30/2017 0.0  0 - 0.1 K/uL Final  . Sodium 01/30/2017 134* 135 - 145 mmol/L Final  . Potassium 01/30/2017 3.8  3.5 - 5.1 mmol/L Final  . Chloride 01/30/2017 99* 101 - 111 mmol/L Final  . CO2 01/30/2017 27  22 - 32 mmol/L Final  .  Glucose, Bld 01/30/2017 185* 65 - 99 mg/dL Final  . BUN 01/30/2017 20  6 - 20 mg/dL Final  . Creatinine, Ser 01/30/2017 0.77  0.44 - 1.00 mg/dL Final  . Calcium 01/30/2017 9.3  8.9 - 10.3 mg/dL Final  . GFR  calc non Af Amer 01/30/2017 >60  >60 mL/min Final  . GFR calc Af Amer 01/30/2017 >60  >60 mL/min Final   Comment: (NOTE) The eGFR has been calculated using the CKD EPI equation. This calculation has not been validated in all clinical situations. eGFR's persistently <60 mL/min signify possible Chronic Kidney Disease.   . Anion gap 01/30/2017 8  5 - 15 Final  . Magnesium 01/30/2017 1.3* 1.7 - 2.4 mg/dL Final    Assessment:  ABUK SELLECK is a 71 y.o. female with progressive stage IV ovarian cancer.  She presented with abdominal discomfort and bloating.  Omental biopsy on 02/23/2015 revealed metastatic high grade serous carcinoma, consistent with gynecologic origin.   She was initially diagnosed with clinical stage IIIC (T3cN1Mx).  CA125 was 707 on 02/17/2015.  She received 4 cycles of neoadjuvant carboplatin and Taxol (03/05/2015 - 05/22/2015).  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  She had loose stools on oral magnesium.  She was initially on Neurontin then switched Lyrica with cycle #3.  Cycle #4 was notable for neutropenia (ANC 300) requiring GCSF x 3 days.    She underwent exploratory laparotomy, lysis of adhesions, total abdominal hysterectomy with bilateral salpingo-oophorectomy, infracolic omentectomy, optimal tumor debulking(< 1 cm), recto-sigmoid resection with creation of end colostomy, cholecystectomy, mobilization of splenic flexure and liver with diaphragmatic stripping on 06/15/2015. The right diaphragm was cleared of tumor. During dissection, the diaphragm was entered and closed with sutures.  She received tamoxifen from 02/25/2016 - 03/14/2016.  She received 4 cycles of carboplatin and gemcitabine (03/21/2016 - 05/24/2016) with GCSF/Neulasta support.  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  PDL-1 testing revealed a combined score was 5 (>=1 positive).     She received 2 cycles of Doxil (06/21/2016 - 07/19/2016) with Neulasta support.  She received 4 cycles of topotecan with Neulasta support (08/22/2016 - 10/10/2016).    She received 3 cycles of pembrolizumab (Keytruda) from 12/06/2016 -  01/09/2017.  Cycle #1 was complicated by neutropenia (ANC 400) requiring GCSF/Granix x 3 days.  She received GCSF x 5 days with cycle #2.  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 Right lower chest pleural metastasis and several of the peritoneal tumor implants had increased.  There was new right inguinal lymphadenopathy.  Some of the peritoneal tumor implants had decreased.  The small dependent left pleural effusion was slightly increased .  The mild left hydroureteronephrosis was improved.  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   The chronic biliary ductal dilatation was stable .  There was no evidence of bowel obstruction.  CA125 was 802.9 on 03/30/2015, 567.9 on 04/13/2015, 168.8 on 05/15/2015, 85.2 on 07/17/2015, 68.6 on 07/28/2015, 34.5 on 09/17/2015, 20.7 on 11/06/2015, 49 on 01/22/2016, 106.1 on 02/22/2016, 138.8 on 03/07/2016, 220.8 on 03/21/2016, 152.8 on 04/11/2016, 125.6 on 04/18/2016, 76.8 on 05/03/2016, 60.2 on 05/24/2016, 44 on 07/19/2016, 65.8 on 08/17/2016, 53.4 on 09/12/2016, 47.3 on 10/10/2016, 91 on 12/20/2016, and 141.1 on 01/09/2017.  She has a history of recurrent right sided pleural effusion.  She underwent thoracentesis of 650 cc on post-operative day 3.  She was admitted to [facility] on 07/01/2015 and 07/07/2015 for recurrent shortness of breath.  She underwent 2 additional thoracenteses (1.1 L on 07/02/2015 and 850 cc on 07/08/2015).  Cytology was negative x 2.  Bilateral lower  extremity duplex on  07/03/2015 was negative.  Echo revealed an EF of 55-60% on 07/08/2015 and 60-65% on 05/27/2016.    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 She was on Lovenox 60 mg twice a day.  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  She has severe rheumatoid arthritis.  Methotrexate and Enbrel were initially on hold.  She has a normocytic anemia.  Work-up on 02/17/2015 and 05/24/2016 revealed a normal ferritin, B12, folate, TSH.  She denies any melena or hematochezia.    She has anemia due to chronic disease. She received 1 unit PRBCs during her admission at [facility]. She denies any melena or hematochezia. She has diabetes and is on an insulin pump.  She has had persistent neutropenia felt secondary to her rheumatoid arthritis.  Folate and MMA were normal.  TSH was 6.13 (high) with a free T4 of 1.17 (0.61-1.12).  She began methotrexate (10 mg a week) and prednisone (5 mg a day) for severe rheumatoid arthritis on 12/17/2015.  She remains on prednisone alone (5 mg a day).  Bone marrow aspirate and biopsy on 06/09/2010 revealed a hypercellular marrow (70%) with no evidence of dysplasia or malignancy.  Flow cytometry was negative.  Cytogenetics were normal (46,XX).  FISH studies were negative for MDS.  Bone marrow aspirate and biopsy on 12/03/2015 revealed a normocellular to mildy hypercellular marrow for age (40%) with left shifted myelopoiesis, non specific dyserythropoiesis and mild megakaryocytic atypia with no increase in blasts.  There were multiple small nonspecific lymphoid aggregates (favor reactive). There was no increase in reticulin.  There was decreased myeloid cells (37%) with left shifted maturation and 1% atypical myelod blasts.  There was relatively increased monocytic cells (11%), relatively  increased lymphoid cells (36%), and relatively increased eosinophils (6%).  Cytogenetics were normal (76, XX).  SNP microarray was normal.  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  She receives IV magnesium weekly.  She has chemotherapy induced anemia.  She has received Procrit (last 11/07/2016).  Labs on 08/22/2016 revealed a normal B12 and folate.  Ferritin was 37, iron saturation 11% and TIBC 281.  Code status is DNR/DNI.  She developed acute LFT elevation on 11/28/2016.  Event was preceded by upper abdominal discomfort.  Hepatitis B core antibody total and hepatitis C antibody were negative.  Symptomatically, she feels "great".  Exam is stable.  She has chemotherapy induced anemia. WBC 3900 with an ANC of 2300. Hemoglobin is 10.8, hematocrit 31.1, and platelets 202,000.  Magnesium is 1.3 (low).  Plan: 1.  Labs today:  CBC with diff, Mg.   2.  Discuss restaging scans that were done on 01/20/2017- mixed response.  Discuss clinical trail enrollment. She is willing to travel. Will communicate with patient once we hear from clinical research about potential open trials. 3.  Discuss treatment with Abraxane (2-3 weeks on and 1 week off) if no clinical trial options. 4.  Preauthorize Abraxane. 5.  Magnesium 4 gram IV today. 6.  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  7.  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   8.  RTC in 1 week for labs (CBC with diff, BMP, Mg), +/- Mg.   Melissa C. Mike Gip, MD 02/06/2017, 2:46 AM

## 2017-02-13 ENCOUNTER — Other Ambulatory Visit: Payer: Self-pay | Admitting: Family Medicine

## 2017-02-13 ENCOUNTER — Inpatient Hospital Stay: Payer: Medicare Other

## 2017-02-13 DIAGNOSIS — C569 Malignant neoplasm of unspecified ovary: Secondary | ICD-10-CM | POA: Diagnosis not present

## 2017-02-13 DIAGNOSIS — C561 Malignant neoplasm of right ovary: Secondary | ICD-10-CM

## 2017-02-13 LAB — CBC WITH DIFFERENTIAL/PLATELET
BASOS ABS: 0 10*3/uL (ref 0–0.1)
Basophils Relative: 0 %
Eosinophils Absolute: 0 10*3/uL (ref 0–0.7)
Eosinophils Relative: 0 %
HEMATOCRIT: 31.8 % — AB (ref 35.0–47.0)
Hemoglobin: 10.8 g/dL — ABNORMAL LOW (ref 12.0–16.0)
LYMPHS ABS: 0.4 10*3/uL — AB (ref 1.0–3.6)
LYMPHS PCT: 13 %
MCH: 28.6 pg (ref 26.0–34.0)
MCHC: 33.8 g/dL (ref 32.0–36.0)
MCV: 84.5 fL (ref 80.0–100.0)
MONO ABS: 0.5 10*3/uL (ref 0.2–0.9)
MONOS PCT: 14 %
NEUTROS ABS: 2.4 10*3/uL (ref 1.4–6.5)
Neutrophils Relative %: 73 %
Platelets: 190 10*3/uL (ref 150–440)
RBC: 3.76 MIL/uL — ABNORMAL LOW (ref 3.80–5.20)
RDW: 16.9 % — AB (ref 11.5–14.5)
WBC: 3.4 10*3/uL — ABNORMAL LOW (ref 3.6–11.0)

## 2017-02-13 LAB — MAGNESIUM: Magnesium: 1.3 mg/dL — ABNORMAL LOW (ref 1.7–2.4)

## 2017-02-13 MED ORDER — MAGNESIUM SULFATE 4 GM/100ML IV SOLN
4.0000 g | Freq: Once | INTRAVENOUS | Status: AC
  Administered 2017-02-13: 4 g via INTRAVENOUS
  Filled 2017-02-13: qty 100

## 2017-02-13 MED ORDER — HEPARIN SOD (PORK) LOCK FLUSH 100 UNIT/ML IV SOLN
500.0000 [IU] | Freq: Once | INTRAVENOUS | Status: AC
  Administered 2017-02-13: 500 [IU] via INTRAVENOUS
  Filled 2017-02-13: qty 5

## 2017-02-13 MED ORDER — SODIUM CHLORIDE 0.9% FLUSH
10.0000 mL | INTRAVENOUS | Status: DC | PRN
  Administered 2017-02-13: 10 mL via INTRAVENOUS
  Filled 2017-02-13: qty 10

## 2017-02-15 ENCOUNTER — Inpatient Hospital Stay: Payer: Medicare Other

## 2017-02-16 ENCOUNTER — Other Ambulatory Visit: Payer: Self-pay | Admitting: Hematology and Oncology

## 2017-02-16 DIAGNOSIS — Z5111 Encounter for antineoplastic chemotherapy: Secondary | ICD-10-CM

## 2017-02-16 DIAGNOSIS — C569 Malignant neoplasm of unspecified ovary: Secondary | ICD-10-CM

## 2017-02-17 DIAGNOSIS — E1142 Type 2 diabetes mellitus with diabetic polyneuropathy: Secondary | ICD-10-CM | POA: Insufficient documentation

## 2017-02-17 DIAGNOSIS — T451X5A Adverse effect of antineoplastic and immunosuppressive drugs, initial encounter: Secondary | ICD-10-CM

## 2017-02-17 DIAGNOSIS — G620 Drug-induced polyneuropathy: Secondary | ICD-10-CM | POA: Insufficient documentation

## 2017-02-20 ENCOUNTER — Encounter: Payer: Self-pay | Admitting: Hematology and Oncology

## 2017-02-20 ENCOUNTER — Inpatient Hospital Stay: Payer: Medicare Other

## 2017-02-20 DIAGNOSIS — C561 Malignant neoplasm of right ovary: Secondary | ICD-10-CM

## 2017-02-20 DIAGNOSIS — C569 Malignant neoplasm of unspecified ovary: Secondary | ICD-10-CM | POA: Diagnosis not present

## 2017-02-20 LAB — CBC WITH DIFFERENTIAL/PLATELET
Basophils Absolute: 0 10*3/uL (ref 0–0.1)
Basophils Relative: 1 %
EOS ABS: 0 10*3/uL (ref 0–0.7)
EOS PCT: 0 %
HCT: 32.9 % — ABNORMAL LOW (ref 35.0–47.0)
Hemoglobin: 10.9 g/dL — ABNORMAL LOW (ref 12.0–16.0)
LYMPHS ABS: 0.6 10*3/uL — AB (ref 1.0–3.6)
Lymphocytes Relative: 20 %
MCH: 28.2 pg (ref 26.0–34.0)
MCHC: 33.2 g/dL (ref 32.0–36.0)
MCV: 85 fL (ref 80.0–100.0)
MONO ABS: 0.4 10*3/uL (ref 0.2–0.9)
Monocytes Relative: 15 %
Neutro Abs: 1.8 10*3/uL (ref 1.4–6.5)
Neutrophils Relative %: 64 %
PLATELETS: 193 10*3/uL (ref 150–440)
RBC: 3.87 MIL/uL (ref 3.80–5.20)
RDW: 16.5 % — AB (ref 11.5–14.5)
WBC: 2.8 10*3/uL — AB (ref 3.6–11.0)

## 2017-02-20 LAB — MAGNESIUM: MAGNESIUM: 1.1 mg/dL — AB (ref 1.7–2.4)

## 2017-02-20 MED ORDER — SODIUM CHLORIDE 0.9 % IV SOLN
INTRAVENOUS | Status: DC
  Administered 2017-02-20: 09:00:00 via INTRAVENOUS
  Filled 2017-02-20: qty 1000

## 2017-02-20 MED ORDER — SODIUM CHLORIDE 0.9% FLUSH
10.0000 mL | Freq: Once | INTRAVENOUS | Status: AC
  Administered 2017-02-20: 10 mL via INTRAVENOUS
  Filled 2017-02-20: qty 10

## 2017-02-20 MED ORDER — HEPARIN SOD (PORK) LOCK FLUSH 100 UNIT/ML IV SOLN
500.0000 [IU] | Freq: Once | INTRAVENOUS | Status: AC
  Administered 2017-02-20: 500 [IU] via INTRAVENOUS
  Filled 2017-02-20: qty 5

## 2017-02-20 MED ORDER — SODIUM CHLORIDE 0.45 % IV SOLN
INTRAVENOUS | Status: DC
Start: 2017-02-20 — End: 2017-02-20
  Filled 2017-02-20: qty 1000

## 2017-02-20 MED ORDER — MAGNESIUM SULFATE 4 GM/100ML IV SOLN
4.0000 g | Freq: Once | INTRAVENOUS | Status: AC
  Administered 2017-02-20: 4 g via INTRAVENOUS
  Filled 2017-02-20: qty 100

## 2017-02-22 ENCOUNTER — Inpatient Hospital Stay (HOSPITAL_BASED_OUTPATIENT_CLINIC_OR_DEPARTMENT_OTHER): Payer: Medicare Other | Admitting: Obstetrics and Gynecology

## 2017-02-22 ENCOUNTER — Inpatient Hospital Stay: Payer: Medicare Other

## 2017-02-22 ENCOUNTER — Encounter: Payer: Self-pay | Admitting: Obstetrics and Gynecology

## 2017-02-22 VITALS — BP 144/77 | HR 85 | Temp 98.0°F | Resp 18 | Ht 63.0 in | Wt 136.5 lb

## 2017-02-22 DIAGNOSIS — Z7952 Long term (current) use of systemic steroids: Secondary | ICD-10-CM

## 2017-02-22 DIAGNOSIS — I25810 Atherosclerosis of coronary artery bypass graft(s) without angina pectoris: Secondary | ICD-10-CM | POA: Diagnosis not present

## 2017-02-22 DIAGNOSIS — E785 Hyperlipidemia, unspecified: Secondary | ICD-10-CM

## 2017-02-22 DIAGNOSIS — Z90722 Acquired absence of ovaries, bilateral: Secondary | ICD-10-CM

## 2017-02-22 DIAGNOSIS — C786 Secondary malignant neoplasm of retroperitoneum and peritoneum: Secondary | ICD-10-CM | POA: Diagnosis not present

## 2017-02-22 DIAGNOSIS — Z794 Long term (current) use of insulin: Secondary | ICD-10-CM | POA: Diagnosis not present

## 2017-02-22 DIAGNOSIS — I509 Heart failure, unspecified: Secondary | ICD-10-CM

## 2017-02-22 DIAGNOSIS — Z7901 Long term (current) use of anticoagulants: Secondary | ICD-10-CM

## 2017-02-22 DIAGNOSIS — C782 Secondary malignant neoplasm of pleura: Secondary | ICD-10-CM | POA: Diagnosis not present

## 2017-02-22 DIAGNOSIS — E11621 Type 2 diabetes mellitus with foot ulcer: Secondary | ICD-10-CM | POA: Diagnosis not present

## 2017-02-22 DIAGNOSIS — M069 Rheumatoid arthritis, unspecified: Secondary | ICD-10-CM | POA: Diagnosis not present

## 2017-02-22 DIAGNOSIS — C569 Malignant neoplasm of unspecified ovary: Secondary | ICD-10-CM | POA: Diagnosis not present

## 2017-02-22 DIAGNOSIS — Z90710 Acquired absence of both cervix and uterus: Secondary | ICD-10-CM | POA: Diagnosis not present

## 2017-02-22 DIAGNOSIS — I110 Hypertensive heart disease with heart failure: Secondary | ICD-10-CM

## 2017-02-22 DIAGNOSIS — Z9221 Personal history of antineoplastic chemotherapy: Secondary | ICD-10-CM

## 2017-02-22 DIAGNOSIS — Z79899 Other long term (current) drug therapy: Secondary | ICD-10-CM

## 2017-02-22 DIAGNOSIS — I252 Old myocardial infarction: Secondary | ICD-10-CM

## 2017-02-22 DIAGNOSIS — E559 Vitamin D deficiency, unspecified: Secondary | ICD-10-CM

## 2017-02-22 DIAGNOSIS — K219 Gastro-esophageal reflux disease without esophagitis: Secondary | ICD-10-CM

## 2017-02-22 DIAGNOSIS — C778 Secondary and unspecified malignant neoplasm of lymph nodes of multiple regions: Secondary | ICD-10-CM

## 2017-02-22 NOTE — Patient Instructions (Addendum)
Pressure Injury A pressure injury, sometimes called a bedsore, is an injury to the skin and underlying tissue caused by pressure. Pressure on blood vessels causes decreased blood flow to the skin, which can eventually cause the skin tissue to die and break down into a wound. Pressure injuries usually occur:  Over bony parts of the body such as the tailbone, shoulders, elbows, hips, and heels.  Under medical devices such as respiratory equipment, stockings, tubes, and splints.  Pressure injuries start as reddened areas on the skin and can lead to pain, muscle damage, and infection. Pressure injuries can vary in severity. What are the causes? Pressure injuries are caused by a lack of blood supply to an area of skin. They can occur from intense pressure over a short period of time or from less intense pressure over a long period of time. What increases the risk? This condition is more likely to develop in people who:  Are in the hospital or an extended care facility.  Are bedridden or in a wheelchair.  Have an injury or disease that keeps them from: ? Moving normally. ? Feeling pain or pressure.  Have a condition that: ? Makes them sleepy or less alert. ? Causes poor blood flow.  Need to wear a medical device.  Have poor control of their bladder or bowel functions (incontinence).  Have poor nutrition (malnutrition).  Are of certain ethnicities. People of African American and Latino or Hispanic descent are at higher risk compared to other ethnic groups.  If you are at risk for pressure ulcers, your health care provider may recommend certain types of bedding to help prevent them. These may include foam or gel mattresses covered with one of the following:  A sheepskin blanket.  A pad that is filled with gel, air, water, or foam.  What are the signs or symptoms? The main symptom is a blister or change in skin color that opens into a wound. Other symptoms include:  Red or dark  areas of skin that do not turn white or pale when pressed with a finger.  Pain, warmth, or change of skin texture.  How is this diagnosed? This condition is diagnosed with a medical history and physical exam. You may also have tests, including:  Blood tests to check for infection or signs of poor nutrition.  Imaging studies to check for damage to the deep tissues under your skin.  Blood flow studies.  Your pressure injury will be staged to determine its severity. Staging is an assessment of:  The depth of the pressure injury.  Which tissues are exposed because of the pressure injury.  The causes of the pressure injury.  How is this treated? The main focus of treatment is to help your injury heal. This may be done by:  Relieving or redistributing pressure on your skin. This includes: ? Frequently changing your position. ? Eliminating or minimizing positions that caused the wound or that can make the wound worse. ? Using specific bed mattresses and chair cushions. ? Refitting, resizing, or replacing any medical devices, or padding the skin under them. ? Using creams or powders to prevent rubbing (friction) on the skin.  Keeping your skin clean and dry. This may include using a skin cleanser or skin protectant as told by your health care provider. This may be a lotion, ointment, or spray.  Cleaning your injury and removing any dead tissue from the wound (debridement).  Placing a bandage (dressing) over your injury.  Preventing or treating infection.  This may include antibiotic, antimicrobial, or antiseptic medicines.  Treatment may also include medicine for pain. Sometimes surgery is needed to close the wound with a flap of healthy skin or a piece of skin from another area of your body (graft). You may need surgery if other treatments are not working or if your injury is very deep. Follow these instructions at home: Wound care  Follow instructions from your health care  provider about: ? How to take care of your wound. ? When and how you should change your dressing. ? When you should remove your dressing. If your dressing is dry and stuck when you try to remove it, moisten or wet the dressing with saline or water so that it can be removed without harming your skin or wound tissue.  Check your wound every day for signs of infection. Have a caregiver do this for you if you are not able. Watch for: ? More redness, swelling, or pain. ? More fluid, blood, or pus. ? A bad smell. Skin Care  Keep your skin clean and dry. Gently pat your skin dry.  Do not rub or massage your skin.  Use a skin protectant only as told by your health care provider.  Check your skin every day for any changes in color or any new blisters or sores (ulcers). Have a caregiver do this for you if you are not able. Medicines  Take over-the-counter and prescription medicines only as told by your health care provider.  If you were prescribed an antibiotic medicine, take it or apply it as told by your health care provider. Do not stop taking or using the antibiotic even if your condition improves. Reducing and Redistributing Pressure  Do not lie or sit in one position for a long time. Move or change position every two hours or as told by your health care provider.  Use pillows or cushions to reduce pressure. Ask your health care provider to recommend cushions or pads for you.  Use medical devices that do not rub your skin. Tell your health care provider if one of your medical devices is causing a pressure injury to develop. General instructions   Eat a healthy diet that includes lots of protein. Ask your health care provider for diet advice.  Drink enough fluid to keep your urine clear or pale yellow.  Be as active as you can every day. Ask your health care provider to suggest safe exercises or activities.  Do not abuse drugs or alcohol.  Keep all follow-up visits as told by your  health care provider. This is important.  Do not smoke. Contact a health care provider if:   You have chills or fever.  Your pain medicine is not helping.  You have any changes in skin color.  You have new blisters or sores.  You develop warmth, redness, or swelling near a pressure injury.  You have a bad odor or pus coming from your pressure injury.  You lose control of your bowels or bladder.  You develop new symptoms.  Your wound does not improve after 1-2 weeks of treatment.  You develop a new medical condition, such as diabetes, peripheral vascular disease, or conditions that affect your defense (immune) system. This information is not intended to replace advice given to you by your health care provider. Make sure you discuss any questions you have with your health care provider. Document Released: 04/18/2005 Document Revised: 09/21/2015 Document Reviewed: 08/27/2014 Elsevier Interactive Patient Education  [employer].  Skin Abscess A skin abscess is an infected area on or under your skin that contains pus and other material. An abscess can happen almost anywhere on your body. Some abscesses break open (rupture) on their own. Most continue to get worse unless they are treated. The infection can spread deeper into the body and into your blood, which can make you feel sick. Treatment usually involves draining the abscess. Follow these instructions at home: Abscess Care  If you have an abscess that has not drained, place a warm, clean, wet washcloth over the abscess several times a day. Do this as told by your doctor.  Follow instructions from your doctor about how to take care of your abscess. Make sure you: ? Cover the abscess with a bandage (dressing). ? Change your bandage or gauze as told by your doctor. ? Wash your hands with soap and water before you change the bandage or gauze. If you cannot use soap and water, use hand sanitizer.  Check your abscess every  day for signs that the infection is getting worse. Check for: ? More redness, swelling, or pain. ? More fluid or blood. ? Warmth. ? More pus or a bad smell. Medicines   Take over-the-counter and prescription medicines only as told by your doctor.  If you were prescribed an antibiotic medicine, take it as told by your doctor. Do not stop taking the antibiotic even if you start to feel better. General instructions  To avoid spreading the infection: ? Do not share personal care items, towels, or hot tubs with others. ? Avoid making skin-to-skin contact with other people.  Keep all follow-up visits as told by your doctor. This is important. Contact a doctor if:  You have more redness, swelling, or pain around your abscess.  You have more fluid or blood coming from your abscess.  Your abscess feels warm when you touch it.  You have more pus or a bad smell coming from your abscess.  You have a fever.  Your muscles ache.  You have chills.  You feel sick. Get help right away if:  You have very bad (severe) pain.  You see red streaks on your skin spreading away from the abscess. This information is not intended to replace advice given to you by your health care provider. Make sure you discuss any questions you have with your health care provider. Document Released: 10/05/2007 Document Revised: 12/13/2015 Document Reviewed: 02/25/2015 Elsevier Interactive Patient Education  [employer].

## 2017-02-22 NOTE — Progress Notes (Signed)
Gynecologic Oncology Interval Visit   Referring Provider: Lequita Asal, MD  Additional Care Provider: Dr. Harvest Dark.  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    Subjective:  [name] is a 71 y.o. 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  Dr. Mike Gip discussed alternative options with her including abraxane and pembrolizumab. PDL-1 testing was positive and she proceeded with pembrolizumab (cycle #3 9/102018) but concern for progression given rise in CA125 and mixed response vs progressive disease on CT scan.   Abdomen and pelvic CT scan on 01/20/2017 revealed complex mixed interval changes in widespread metastatic disease in the peritoneal cavity, lower chest and thoracic, abdominal and pelvic lymph nodes. Right lower chest pleural metastasis and several of the peritoneal tumor implants had increased.  There was new right inguinal lymphadenopathy.  Some of the peritoneal tumor implants had decreased.  The small dependent left pleural effusion was slightly increased .  The mild left hydroureteronephrosis was improved.  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   The chronic biliary ductal dilatation was stable .  There was no evidence of bowel obstruction. .  47.3 on 10/10/2016, 91 on 12/20/2016, and 141.1 on 01/09/2017.  She presents for a pelvic exam today.   Gynecologic Oncology History: The patient presented to the emergency room at [facility] on 02/13/2015 with a 2-3 week history of diffuse abdominal discomfort and bloating.    Abdomen and pelvic CT scan on 02/13/2015 revealed bilateral mass-like adnexal regions. The right adnexal region  measured approximately 5.6 x 5.0 cm posterior to the uterus, and the left adnexa demonstrated a large mass measuring approximately 10.3 x 6.0 x 9.9 cm, with heterogeneous internal areas of enhancement and cystic change. This appeared intimately associated with the adjacent proximal sigmoid colon. There was a large amount of soft tissue throughout the peritoneal cavity involving the omentum and other peritoneal surfaces, compatible with widespread intraperitoneal metastatic disease. There was a small volume of presumably malignant ascites. There was a peripheral low-attenuation lesion overlying the right lobe of the liver measuring approximately 3.3 x 1.9 cm, favored to represent a serosal implant. There was a similar appearing 1.4 x 2.2 cm ill-defined peripheral lesion within the inferior aspect of segment 6 adjacent to the ampulla in the duodenum. There was no lytic or blastic lesions. She had a chronic T12 compression fracture.  She was seen in consultation from Dr. Mike Gip for bilateral ovarian masses with associated peritoneal metastatic disease.   02/23/2015 CT biopsy high grade serous carcinoma.  Decision was made for neoadjuvant chemotherapy with carboplatin/taxol. She has had 4 cycles of carboplatin (AUC 5) /taxol (175 mg per sq M) with Dr Mike Gip, last 12/19.  CA125 dropped from 707 in 10/16 to 168.8 in 05/15/2015.   She stopped MTX and Enbarel she had been taking for rheumatoid arthritis while on chemotherapy and joint symptoms have been in good control.  Uses Prednisone 10m qd prn and has taken this the last three days.   CT scan 04/27/16 Heterogeneous left adnexal mass measures approximately 4.0 x 6.1 cm, previously approximately 5.0 x 8.5 cm when remeasured. Heterogeneous right ovary measures approximately 3.8 x 4.9 cm, roughly 4.7 x 5.6 cm on the prior exam. Uterus is visualized. Other: Omental caking has improved in the interval  but has not fully resolved. Residual scattered areas of  omental nodularity and thickening persist. Small ascites, decreased.   Hepatobiliary: Low-attenuation lesion in the periphery of the hepatic dome measures 3.1 cm, stable. A lesion previously seen off the posterior right hepatic lobe is not readily visualized. There is a small amount of fluid in Morison's pouch. Numerous stones are seen in the gallbladder. No biliary ductal dilatation.  IMPRESSION: 1. Interval response to therapy as evidenced by decrease in size of bilateral ovarian masses and improved, but not resolved, peritoneal carcinomatosis. 2. Hepatic dome lesion is unchanged. Previously seen right hepatic lobe lesion is not well-visualized. 3. Small ascites and small left pleural effusion. Trace right pleural effusion. 4. Cholelithiasis. 5. Nodular lesion at the ampulla of Vater, extending into the duodenum, stable.  On 06/15/2015 she underwent diagnostic laparoscopy, exploratory laparotomy, lysis of adhesions (including enterolysis for > 45 minutes), total abdominal hysterectomy with bilateral salpingo-oophorectomy, infracolic omentectomy, optimal tumor debulking, recto-sigmoid resection with creation of end colostomy, mobilization of the splenic flexure  mobilization of the liver with diaphragmatic stripping and resection of diaphragm due to disease, repair of diaphragmatic defect and cholecystectomy. Her initial postoperative course was unremarkable.   PATHOLOGY: DIAGNOSIS  A. Uterus, bilateral ovaries and fallopian tubes, hysterectomy and bilateral salpingo-oophorectomy:  Left ovary: Residual high grade serous carcinoma, 2.5 cm. Right ovary: Residual high grade serous carcinoma, 1.5 cm.  Right and left fallopian tubes, free of tumor. See comment.  Uterus:  Residual high grade serous carcinoma involving uterine serosa and myometrium. Endometrium: Atrophic endometrium Cervix: No pathologic diagnosis.   See synoptic report for additional details.  Comment: Immunostains  performed on block A17 ( right tube) show a small segment of p53 positive tubal epithelium. However, this focus of epithelium is cytological unremarkable and shows no increase in Ki-67 labelling or P16 staining. It is therefore classified as a p53 signature lesion rather than a serous tubal intraepithelial carcinoma.    B. Rectomsigmoid colon and partial left ovary, resection:   Residual high grade serous carcinoma involving colonic serosa and subserosa, fallopian tube and ovary.   C. Omentum, omentectomy:   Residual high grade serous carcinoma, tumor size > 2 cm.   D. Perihepatic tumor, excision:  Residual high grade serous carcinoma.   E. Gallbladder, cholecystectomy:  Chronic cholecystitis and cholelithiasis. Negative for malignancy.   F. Diaphragm, excision:  Residual high grade serous carcinoma, > 2 cm  G. Omentum #2, excision:   Residual high grade serous carcinoma.     She developed dyspnea and was admitted twice to [facility]. She was admitted to [facility] on 07/01/2015 and 07/07/2015 for recurrent shortness of breath.  She has undergone 2 additional thoracentesis (1.1 L on 07/02/2015 and 850 cc on 07/08/2015).  Cytology was negative x 2.  Bilateral lower extremity duplex on 07/03/2015 was negative.  Echo on 07/08/2015 revealed en EF of 55-60%.    She was admitted to [facility] from 07/29/2015 - 08/10/2015 with a right-sided empyema and perihepatic abscess. She underwent CT-guided placement of a liver abscess drain on 07/30/2015. Liver abscess culture grew out group B strep and Enterobacter which was sensitive to Zosyn. She was transitioned to ertapenem Colbert Ewing) prior to discharge.  She was readmitted to [facility] on 09/17/2015.  Chest, abdomen, and pelvic CT scan on 09/01/2015 revealed continued decrease in perihepatic fluid collection contiguous with the right pleural space with percutaneous drain. Drain was removed on 09/01/2015.  Chest, abdomen, and pelvic CT scan on  09/11/2015 revealed a residual versus recurrent  fluid collection posterior to the right hepatic lobe (2.6 x 1.1 x 4.0 cm).  Chest, abdomen, and pelvic CT scan on 10/13/2015 revealed resolution of the empyema.    Chemotherapy was held due to a combination of multiple comorbidities and RA associated neutropenia.  We recommended proceeding with chemotherapy if she had a symptomatic relapse or if her CT scan shows considerable disease.  02/15/2016:CT scan revealed multiple soft tissue nodules throughout the pelvis, small bowel mesenteric and peritoneum and, concerning for peritoneal metastatic disease.  There was soft tissue irregularity within the left upper quadrant which may represent postprocedural changes or metastatic disease; mild right hydronephrosis (etiology unclear); a pleural based nodule within the right lower hemi-thorax concerning for the possibility of pleural based metastasis; small bilateral pleural effusions; pulmonary nodularity, predominately within the left upper lobe (metastatic or infectious/inflammatory etiology); slightly increased mediastinal adenopathy (infectious/inflammatory or metastatic); interval development of a low attenuation lesion within the left hepatic lobe (complicated fluid within the fissure or metastatic disease).  She was restarted on chemotherapy with carboplatin and gemcitabine.  She just saw Dr. Mike Gip prior to day 8 of cycle #3 carboplatin and gemcitabine.  05/26/2016 Imaging included MR brain that revealed multiple small areas of acute infarct involving the occipital parietal lobe bilaterally and the left lateral cerebellum, consistent with posterior circulation emboli.  Atrophy and mild chronic microvascular ischemia. US carotid: Less than 50% stenosis in the right internal carotid artery.  50-69% stenosis in the left internal carotid artery. CT angiography: Right proximal ICA mild 40% stenosis due to calcified plaque, Left proximal ICA moderate 60%  stenosis due to calcified plaque, Bilateral vertebral artery V3/V4 junction calcified plaque with moderate stenosis, Bilateral ICA cavernous and paraclinoid severe calcific atherosclerosis with mild-to-moderate paraclinoid stenosis, Otherwise no significant stenosis, proximal occlusion, aneurysm, or vascular malformation of circle of Willis; Mild aortic atherosclerosis; 15 mm left thyroid lobe nodule, this can be further characterized with thyroid ultrasound on a nonemergent basis; Left upper lobe pulmonary nodules measuring up to 4 mm.   06/10/2016 CT abdomen and pelvis: Progression of peritoneal carcinomatosis; worsening bilateral obstructive uropathy due to malignant involvement of the pelvic ureters; stable small pleural/subpleural nodules right and small left pleural effusion; additional findings include aortic atherosclerosis, coronary atherosclerosis, stable nonspecific ampullary mass and small hiatal hernia.  Her chemotherapy was altered to Doxil and she was responding based on CA125 values. 60.2 to 44. But, she progressed after 2 cycles of Doxil (06/21/2016 - 07/19/2016).    08/09/2016 She was seen in [facility] ER with concerns for SBO and transferred to [facility] on 08/10/2016. She had a transition point in the RLQ associated with a fluid filled cystic lesion.   08/09/2016 CT scan A/P: Interval increase and loculated fluid collections within the peritoneal space consistent with progression of peritoneal ovarian carcinoma metastasis. One large 7 cm collection in the RIGHT lower quadrant is increased significantly in size and may represent of point of small bowel obstruction; Increase in subcapsular hepatic metastatic fluid collections; Enlargement of rounded ampullary lesion in the second portion duodenum is concerning for metastatic lesion; nodular pleural metastasis in the RIGHT lower lobe pleural space. Lesions increased.  She was managed conservatively and discharged home on 08/14/2016. During that  hospitalization we had a goals of care conversation. She is DNAR and has advanced care directives in place. Her husband is her HCPOA. She is interested in more therapy.  She was initiated on single agent topotecan (starting 08/22/2016 - 10/10/2016) and received 4 cycles of therapy. She  required Neulasta support.    Abdomen and pelvic CT scan on 11/03/2016 revealed multifocal cystic metastases in the abdomen/pelvis, reflecting peritoneal disease.  The dominant lesion in the left anterior abdomen has progressed, while additional lesions were mixed.  Peritoneal disease along the liver and spleen progressed.  Pleural-based metastases along the posterior right hemithorax progressed. There were trace left pleural effusion.  There was a stable 1.7 cm duodenal lesion at the ampulla.    She received 3 cycles of pembrolizumab (Keytruda) from 12/06/2016 -  01/09/2017.  Cycle #1 was complicated by neutropenia (ANC 400) requiring GCSF/Granix x 3 days.  She received GCSF x 5 days with cycle #2   CA125 was 802.9 on 03/30/2015, 567.9 on 04/13/2015, 168.8 on 05/15/2015, 85.2 on 07/17/2015, 68.6 on 07/28/2015, 34.5 on 09/17/2015, 20.7 on 11/06/2015, 49 on 01/22/2016, 106.1 on 02/22/2016, 138.8 on 03/07/2016, 220.8 on 03/21/2016, 152.8 on 04/11/2016, 125.6 on 04/18/2016, 76.8 on 05/03/2016, 60.2 on 05/24/2016, 44 on 07/19/2016, 65.8 on 08/17/2016, 53.4 on 09/12/2016, 47.3 on 10/10/2016, 91 on 12/20/2016, and 141.1 on 01/09/2017.    Genetic testing: MyRisk testing negative for APC, ATM, BMPR1A, BRCA1, BRCA2, BRIP1, CDH1, CDK4, CDKN2A, CHEK2, EPCAM, MLH1, MSH2, MSH6, MUTYH, NBN, PALB2, PMS2, PTEN, RAD51C, RAD51D, SMAD4, STK11, TP53. Sequencing for select regions of POLE and POLD1, and large rearrangement analysis for select regions of GREM1 were negative.  PDL-1 testing revealed a combined score was 5 (>=1 positive).    Of note she has severe rheumatoid arthritis with persistent neutropenia; diabetes and is on an  insulin pump; near occlusive thrombus within the central portion of the right internal jugular vein and central portion of the right subclavian vein on Lovenox; and persistent grade III neuropathy.       Problem List: Patient Active Problem List   Diagnosis Date Noted  . Encounter for antineoplastic immunotherapy 12/26/2016  . Elevated liver function tests 11/28/2016  . Anemia due to antineoplastic chemotherapy 11/13/2016  . Pancytopenia ([place]) 09/27/2016  . Thrombocytopenia ([place]) 09/04/2016  . Goals of care, counseling/discussion 08/19/2016  . SBO (small bowel obstruction) ([place]) 08/10/2016  . Pressure injury of skin 05/27/2016  . Altered mental status 05/26/2016  . Acute embolic stroke ([place]) 81/19/1478  . Hx of CABG 04/12/2016  . Encounter for antineoplastic chemotherapy 03/31/2016  . Heel ulcer ([place]) 12/09/2015  . Bile duct abnormality 12/09/2015  . Diabetic ulcer of left heel associated with type 1 diabetes mellitus ([place]) 12/07/2015  . Alkaline phosphatase elevation 11/17/2015  . Leukopenia 11/06/2015  . Weight loss 09/17/2015  . Liver abscess 09/03/2015  . Thrombosis of right internal jugular vein ([place]) 09/03/2015  . Hypokalemia 08/07/2015  . Chronic blood loss anemia 07/29/2015  . Fatigue 07/17/2015  . Recurrent pleural effusion on right 07/01/2015  . Hypoalbuminemia due to protein-calorie malnutrition ([place]) 06/20/2015  . Severe protein-calorie malnutrition ([place]) 06/19/2015  . Primary malignant neoplasm of right ovary with widespread metastatic disease ([place]) 05/28/2015  . Decubitus ulcer 05/19/2015  . Hyponatremia 05/15/2015  . Neuropathy 05/15/2015  . Malignant neoplasm of ovary ([place]) 05/06/2015  . Bilateral lower extremity edema 03/30/2015  . Hypomagnesemia 03/05/2015  . Diarrhea 02/26/2015  . Peritoneal carcinomatosis ([place]) 02/17/2015  . Anemia 02/17/2015  . Adnexal mass 02/13/2015  . History of esophageal stricture 07/08/2014  . Dysphagia, pharyngoesophageal  phase 07/08/2014  . Candida esophagitis ([place]) 05/15/2014  . Cataracts, bilateral 01/13/2014  . Medicare annual wellness visit, subsequent 09/20/2013  . CAD (coronary artery disease) of artery bypass graft 11/05/2012  . Anemia, iron  deficiency 10/09/2012  . CAD (coronary artery disease) 10/01/2012  . Hearing loss 07/06/2011  . Diabetes type 1, controlled ([place]) 07/06/2011  . Gastroesophageal reflux disease with stricture 07/06/2011  . GERD (gastroesophageal reflux disease) 04/05/2011  . Osteoporosis, unspecified 04/05/2011  . Hypertension 01/10/2011  . Hyperlipidemia 01/10/2011  . RA (rheumatoid arthritis) ([place]) 01/10/2011  . Vitamin D deficiency 12/03/2008  . 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 CAD (coronary artery disease)    a. 09/2012 Cath: LM nl, LAD 95p, 73m LCX 976mOM2 50, RCA 100.  . Marland KitchenHF (congestive heart failure) ([facility]  . Cholelithiasis   . Collagen vascular disease (HCC)    Rheumatoid Arthritis.  . Esophageal stricture   . Exertional shortness of breath   . GERD (gastroesophageal reflux disease)   . H/O hiatal hernia   . Herniated disc   . History of pancytopenia   . Hyperlipidemia   . Hypertension   . Hypokalemia   . Leukopenia 2012   s/p bone marrow biopsy, Dr. PaMa Hillock. NSTEMI (non-ST elevated myocardial infarction) ([place]/2014   "mild" (10/18/2012)  . Ovarian cancer ([place]2016   chemo and hysterectomy  . Pneumonia 2013; 08/2012   "one lung; double" (10/18/2012)  . Rheumatoid arthritis(714.0)   . Type I diabetes mellitus ([place]   "dx'd in 1957" (10/18/2012)    Past Surgical History: Past Surgical History:  Procedure Laterality Date  . ABDOMINAL HYSTERECTOMY  06/15/2015   Dx L/S, EXLAP TAH BSO omentectomy RSRx colostomy diaphragm resection stripping  . CARDIAC CATHETERIZATION  10/18/2012   "first one was today" (10/18/2012)  . CATARACT EXTRACTION W/ INTRAOCULAR LENS IMPLANT Right 2010  .  CHOLECYSTECTOMY  06/15/2015   combined case with ovarian cancer debulking  . COLON SURGERY    . CORONARY ARTERY BYPASS GRAFT N/A 10/19/2012   Procedure: CORONARY ARTERY BYPASS GRAFTING (CABG);  Surgeon: StMelrose NakayamaMD;  [place] [facility] Service: Open Heart Surgery;  Laterality: N/A;  . ESOPHAGEAL DILATION     "3 or 4 times" (10/19/2011)  . ESOPHAGOGASTRODUODENOSCOPY  2012   Dr. IfMinna Merritts. OSTOMY    . OVARY SURGERY     removal  . PERIPHERAL VASCULAR CATHETERIZATION N/A 03/02/2015   Procedure: PoGlori Luisath Insertion;  Surgeon: JaAlgernon HuxleyMD;  [place];  Service: Cardiovascular;  Laterality: N/A;  . TUBAL LIGATION  1970    Past Gynecologic History:  Postmenopausal She has not had a GYN evaluation since age 89   OB History:  G3P3  1. SVD - neonatal death 2. SVD - singleton 3. SVD - twins  Family History: Family History  Problem Relation Age of Onset  . Diabetes Mother   . Arthritis Mother   . Diabetes Father   . Arthritis Father   . Aneurysm Sister        neck  . Cancer Sister        lung    Social History: Social History   Social History  . Marital status: Married    Spouse name: N/A  . Number of children: 3  . Years of education: N/A   Occupational History  . Retired Retired   Social History Main Topics  . Smoking status: Former Smoker    Packs/day: 1.00    Years: 30.00    Types: Cigarettes    Quit date: 06/11/1994  . Smokeless tobacco: Never Used  . Alcohol use  No  . Drug use: No  . Sexual activity: No   Other Topics Concern  . Not on file   Social History Narrative  . No narrative on file    Allergies: Allergies  Allergen Reactions  . Codeine Nausea And Vomiting  . Latex Rash    Current Medications: Current Outpatient Prescriptions  Medication Sig Dispense Refill  . Calcium-Vitamin D 600-200 MG-UNIT tablet Take 1 tablet by mouth 2 (two) times daily.     . cholecalciferol (VITAMIN D) 1000 units tablet Take  1,000 Units by mouth daily.    Marland Kitchen enoxaparin (LOVENOX) 60 MG/0.6ML injection INJECT 0.6 ML (60 MG TOTAL) INTO THE SKIN EVERY 12 HOURS 60 mL 0  . folic acid (FOLVITE) 1 MG tablet Take 1 tablet (1 mg total) by mouth daily. 90 tablet 3  . furosemide (LASIX) 80 MG tablet Take 0.5 tablets by mouth 2 (two) times daily.    . insulin lispro (HUMALOG) 100 UNIT/ML injection Inject 0.06 mLs (6 Units total) into the skin daily. (Patient taking differently: Inject 0-85 Units into the skin daily. Via insulin pump) 30 mL 3  . lidocaine-prilocaine (EMLA) cream APPLY 1 APPLICATION TOPICALLY AS NEEDED 1 HOUR PRIOR TO TREATMENT. COVER WITH PRESS N SEAL UNTIL TREATMENT TIME AS DIRECTED 30 g 1  . loratadine (CLARITIN) 10 MG tablet Take 1 tablet by mouth daily as needed.    . magnesium oxide (MAG-OX) 400 (241.3 Mg) MG tablet TAKE 1 TABLET BY MOUTH ONCE DAILY 30 tablet 1  . metoprolol tartrate (LOPRESSOR) 25 MG tablet Take 1 tablet (25 mg total) by mouth 2 (two) times daily. 180 tablet 3  . pantoprazole (PROTONIX) 40 MG tablet TAKE 1 TABLET BY MOUTH TWICE (2) DAILY 180 tablet 1  . potassium chloride SA (K-DUR,KLOR-CON) 20 MEQ tablet TAKE 1 TABLET BY MOUTH ONCE DAILY 30 tablet 0  . predniSONE (DELTASONE) 5 MG tablet Take 5 mg by mouth daily with breakfast.    . vitamin C (ASCORBIC ACID) 500 MG tablet Take 500 mg by mouth daily.    Marland Kitchen zinc gluconate 50 MG tablet Take 50 mg by mouth daily.    . ondansetron (ZOFRAN) 8 MG tablet TAKE 1 TABLET BY MOUTH EVERY 8 HOURS AS NEEDED FOR NAUSEA OR VOMITING (Patient not taking: Reported on 02/22/2017) 30 tablet 1   Current Facility-Administered Medications  Medication Dose Route Frequency Provider Last Rate Last Dose  . Tbo-Filgrastim (GRANIX) injection 300 mcg  300 mcg Subcutaneous Once Lequita Asal, MD       Facility-Administered Medications Ordered in Other Visits  Medication Dose Route Frequency Provider Last Rate Last Dose  . sodium chloride 0.9 % injection 10 mL  10 mL  Intracatheter PRN Corcoran, Melissa C, MD      . sodium chloride 0.9 % injection 10 mL  10 mL Intravenous PRN Nolon Stalls C, MD   10 mL at 04/13/15 0240    Review of Systems General: fatigue and weakness Skin: negative HEENT: negative Pulmonary: dyspnea chronic Cardiac: negative for pain Gastrointestinal:  Abdominal bloating/distension; no N/V/, diarrhea or constipation, ostomy is functioning well.  Genitourinary/Sexual: negative for, dysuria Ob/Gyn: negative for, irregular bleeding, pain Musculoskeletal: positive for back pain  Hematology: negative for, easy bruising, bleeding Neuro: peripheral neuropathy symptoms, chronic secondary from chemotherapy and DM  Objective:  Physical Examination:  BP (!) 144/77   Pulse 85   Temp 98 F (36.7 C) (Tympanic)   Resp 18   Ht 5' 3"  (1.6 m)  Wt 136 lb 8 oz (61.9 kg)   BMI 24.18 kg/m   Body mass index is 24.18 kg/m.  ECOG Performance Status: 1 - Symptomatic but completely ambulatory  General appearance: alert, cooperative and appears stated age, pale HEENT: ATNC Lymphatic survery; negative [place], axillary, and left inguinal node; enlarged 3 cm right inguinal node. Abdomen: soft, nondistended and nontender, no mass/ascites/hernias. Ostomy bag intact,  Incision well healed. SubQ nodules secondary to injections Extremities: extremities atraumatic, resolved edema Neurological exam reveals alert, oriented, normal speech, no focal findings or movement disorder noted.  Pelvic: EGBUS/Vagina: normal. Vaginal cuff intact. No gross lesions. Uterus/cervic surgically absent.  Bimanual: 2 cm nodularity on the right pelvis, on exam. The left side is also negative. RV confirms.    Buttock: 1 cm healing lesion on the right buttock. No evidence of active infection.   Lab Review   Lab Results  Component Value Date   WBC 2.8 (L) 02/20/2017   HGB 10.9 (L) 02/20/2017   HCT 32.9 (L) 02/20/2017   MCV 85.0 02/20/2017   PLT 193 02/20/2017      Chemistry      Component Value Date/Time   NA 134 (L) 01/30/2017 0904   NA 134 (L) 09/27/2012 0459   K 3.8 01/30/2017 0904   K 4.5 09/27/2012 0459   CL 99 (L) 01/30/2017 0904   CL 100 09/27/2012 0459   CO2 27 01/30/2017 0904   CO2 28 09/27/2012 0459   BUN 20 01/30/2017 0904   BUN 8 09/27/2012 0459   CREATININE 0.77 01/30/2017 0904   CREATININE 0.60 09/27/2012 0459      Component Value Date/Time   CALCIUM 9.3 01/30/2017 0904   CALCIUM 8.5 09/27/2012 0459   ALKPHOS 84 01/23/2017 0918   ALKPHOS 100 09/21/2012 1210   AST 17 01/23/2017 0918   AST 36 09/21/2012 1210   ALT 11 (L) 01/23/2017 0918   ALT 23 09/21/2012 1210   BILITOT 0.5 01/23/2017 0918   BILITOT 0.3 04/08/2016 0751   BILITOT 0.7 09/21/2012 1210         Assessment:  [name] is a 71 y.o. 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   Symptomatic right pleural effusion s/p thoracenteses complicated by right-sided empyema and perihepatic abscess, not clinically significant at this time.  Platinum-sensitive recurrence with evidence of response based on CA125. Pelvic exam noted for isolated nodule. Treated with carboplatin and gemcitabine chemotherapy. Progressive disease and platinum-resistance. Single-agent PLD, topotecan, and pembrolizumab sequential therapy, progressive disease.   History of partial SBO resolved with conservative  management.   Healing skin ulcer; at risk for decubitus ulcer.    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  I will discuss options with her as well. I am concerned about her degree of peripheral neuropathy.   We talked about skin abscess (boils) which appears to be healing; she is at risk for decubitus ulcers and we reviewed precautions.    We reviewed that her cancer is most likely incurable, and important goals of care. Hopefully therapy can enhance duration of survival, manage cancer-related symptoms, specifically reduce her pain symptoms which are quite debilitating, and maximize quality of life. We reviewed Hospice, DNAR, Advanced directives, and HCPOA. She is DNAR and I will ask our team to discuss obtaining a Stop Sign from Dr. Mike Gip. She has HCPOA and Advanced care directives in place.   We can see her back in 3 months or as needed for repeat pelvic exam.   Gillis Ends, MD  CC:   Referring Provider: Lequita Asal, MD  Additional Care Provider: Dr. Harvest Dark. Dr. Percell Boston

## 2017-02-22 NOTE — Progress Notes (Signed)
  Oncology Nurse Navigator Documentation Chaperoned pelvic exam. Provided Allevyn for wound with instructions on use. Navigator Location: [facility] (02/22/17 0800)   )Navigator Encounter Type: Follow-up Appt (02/22/17 0800)                     Patient Visit Type: GynOnc (02/22/17 0800)                              Time Spent with Patient: 15 (02/22/17 0800)

## 2017-02-22 NOTE — Progress Notes (Signed)
Right eye with blood in it and it has done it before and she does not have any pain and she still can see. She has pocket of fluid at right lung and she has got it drained from time to time and she will speak to [place] about getting it done in future.

## 2017-02-26 NOTE — Progress Notes (Signed)
[name] is a 71 y.o. female with stage IV ovarian cancer who is seen for reassessment.  HPI:  The patient was last seen in the medical oncology clinic on 01/23/2017.  At that time, she felt great.  Exam was stable.  She had chemotherapy induced anemia. WBC was 3900 with an [place] of 2300. Hemoglobin was 10.8, hematocrit 31.1, and platelets 202,000.  Magnesium was 1.3 (low).  PET scan revealed a mixed response.  We discussed potential clinical trial enrollment.  She was referred to Dr. Manuella Ghazi for her neuropathy.  She was seen by Dr. Manuella Ghazi on 02/13/2017.  As she had previously tried gabapentin and Lyrica without success, she was started on Cymbalta. Patient did not start Cymbalta until 02/24/2017. Nerve conduction studies were ordered.  She saw Dr. Theora Gianotti on 02/22/2017.  She had a 1 cm lesion on the right buttock.  She was felt at risk for decubitus ulcers.  Concern was raised regarding her neuropathy.  She reviewed Hospice, DNR, advanced directives, and HCPOA. She is DNR and was asked to obtain a Stop Sign.  Patient notes that she is "interested in what Dr Theora Gianotti said". Patient advising today that Dr. Theora Gianotti had mentioned that she could try Abraxane and Keytruda concurrently.   Patient spoke with Laurian Brim at [facility] regarding a new genetically modified T-cell trial [phone]) on Friday of last week.  Study does not open until December per patient. Patient is currently awaiting follow up communication regarding this study.   She has received magnesium weekly.  Magnesium was 1.1 last week.  During the interim, patient is having shortness of breath, pain in her back, and nausea.  Patient states, "I think I have that pocket of fluid on my lungs again". Patient is fatigued. She has a decreased appetite. Patient has lost 2 pounds since her last visit.    Past Medical History:  Diagnosis Date  .  CAD (coronary artery disease)    a. 09/2012 Cath: LM nl, LAD 95p, 73m LCX 966mOM2 50, RCA 100.  . Marland KitchenHF (congestive heart failure) ([facility]  . Cholelithiasis   . Collagen vascular disease (HCC)    Rheumatoid Arthritis.  . Esophageal stricture   . Exertional shortness of breath   . GERD (gastroesophageal reflux disease)   . H/O hiatal hernia   . Herniated disc   . History of pancytopenia   . Hyperlipidemia   . Hypertension   . Hypokalemia   . Leukopenia 2012   s/p bone marrow biopsy, Dr. PaMa Hillock. NSTEMI (non-ST elevated myocardial infarction) (HCTyrone5/2014   "mild" (10/18/2012)  . Ovarian cancer (HCAu [place]2016   chemo and hysterectomy  . Pneumonia 2013; 08/2012   "one lung; double" (10/18/2012)  . Rheumatoid arthritis(714.0)   . Type I diabetes mellitus ([place]   "dx'd in 1957" (10/18/2012)    Past Surgical History:  Procedure Laterality Date  . ABDOMINAL HYSTERECTOMY  06/15/2015   Dx L/S, EXLAP TAH BSO omentectomy RSRx colostomy diaphragm resection stripping  . CARDIAC CATHETERIZATION  10/18/2012   "first one was today" (10/18/2012)  . CATARACT EXTRACTION W/ INTRAOCULAR LENS IMPLANT Right 2010  . CHOLECYSTECTOMY  06/15/2015   combined case with ovarian cancer debulking  . COLON SURGERY    . CORONARY ARTERY BYPASS GRAFT N/A 10/19/2012   Procedure: CORONARY ARTERY BYPASS GRAFTING (CABG);  Surgeon: StMelrose NakayamaMD;  [facility]  Service: Open Heart Surgery;  Laterality: N/A;  . ESOPHAGEAL DILATION     "3 or 4 times" (10/19/2011)  . ESOPHAGOGASTRODUODENOSCOPY  2012   Dr. Minna Merritts  . OSTOMY    . OVARY SURGERY     removal  . PERIPHERAL VASCULAR CATHETERIZATION N/A 03/02/2015   Procedure: Glori Luis Cath Insertion;  Surgeon: Algernon Huxley, MD;  Location: [facility];  Service: Cardiovascular;  Laterality: N/A;  . TUBAL LIGATION  1970    Family History  Problem Relation Age of Onset  . Diabetes Mother   . Arthritis Mother   . Diabetes Father   . Arthritis Father   .  Aneurysm Sister        neck  . Cancer Sister        lung    Social History:  reports that she quit smoking about 22 years ago. Her smoking use included Cigarettes. She has a 30.00 pack-year smoking history. She has never used smokeless tobacco. She reports that she does not drink alcohol or use drugs.  Patient is a retired Art therapist. Patient denies exposure to radiation and toxins. She is accompanied by her husband today.  Allergies:  Allergies  Allergen Reactions  . Codeine Nausea And Vomiting  . Latex Rash    Current Medications: Current Outpatient Prescriptions  Medication Sig Dispense Refill  . Calcium-Vitamin D 600-200 MG-UNIT tablet Take 1 tablet by mouth 2 (two) times daily.     . cholecalciferol (VITAMIN D) 1000 units tablet Take 1,000 Units by mouth daily.    . DULoxetine (CYMBALTA) 20 MG capsule Take by mouth.    . enoxaparin (LOVENOX) 60 MG/0.6ML injection INJECT 0.6 ML (60 MG TOTAL) INTO THE SKIN EVERY 12 HOURS 60 mL 0  . folic acid (FOLVITE) 1 MG tablet Take 1 tablet (1 mg total) by mouth daily. 90 tablet 3  . insulin lispro (HUMALOG) 100 UNIT/ML injection Inject 0.06 mLs (6 Units total) into the skin daily. (Patient taking differently: Inject 0-85 Units into the skin daily. Via insulin pump) 30 mL 3  . lidocaine-prilocaine (EMLA) cream APPLY 1 APPLICATION TOPICALLY AS NEEDED 1 HOUR PRIOR TO TREATMENT. COVER WITH PRESS N SEAL UNTIL TREATMENT TIME AS DIRECTED 30 g 1  . magnesium oxide (MAG-OX) 400 (241.3 Mg) MG tablet TAKE 1 TABLET BY MOUTH ONCE DAILY 30 tablet 1  . metoprolol tartrate (LOPRESSOR) 25 MG tablet Take 1 tablet (25 mg total) by mouth 2 (two) times daily. 180 tablet 3  . pantoprazole (PROTONIX) 40 MG tablet TAKE 1 TABLET BY MOUTH TWICE (2) DAILY 180 tablet 1  . potassium chloride SA (K-DUR,KLOR-CON) 20 MEQ tablet TAKE 1 TABLET BY MOUTH ONCE DAILY 30 tablet 0  . predniSONE (DELTASONE) 5 MG tablet Take 5 mg by mouth daily with breakfast.    . vitamin C  (ASCORBIC ACID) 500 MG tablet Take 500 mg by mouth daily.    . Zinc 15 MG CAPS Take by mouth.     . zinc gluconate 50 MG tablet Take 50 mg by mouth daily.    . furosemide (LASIX) 80 MG tablet Take 0.5 tablets by mouth 2 (two) times daily.    Marland Kitchen loratadine (CLARITIN) 10 MG tablet Take 1 tablet by mouth daily as needed.    . ondansetron (ZOFRAN) 8 MG tablet TAKE 1 TABLET BY MOUTH EVERY 8 HOURS AS NEEDED FOR NAUSEA OR VOMITING (Patient not taking: Reported on 02/22/2017) 30 tablet 1   Current Facility-Administered Medications  Medication Dose Route Frequency  Provider Last Rate Last Dose  . Tbo-Filgrastim (GRANIX) injection 300 mcg  300 mcg Subcutaneous Once Lequita Asal, MD       Facility-Administered Medications Ordered in Other Visits  Medication Dose Route Frequency Provider Last Rate Last Dose  . sodium chloride 0.9 % injection 10 mL  10 mL Intracatheter PRN Corcoran, Melissa C, MD      . sodium chloride 0.9 % injection 10 mL  10 mL Intravenous PRN Lequita Asal, MD   10 mL at 04/13/15 3154    Review of Systems:  GENERAL:  Feels "ok".  No fever, chills or sweats.  Weight down 2 pounds. Marland Kitchen PERFORMANCE STATUS (ECOG):  1 HEENT:  No visual changes, sore throat, mouth sores or tenderness. Lungs:  Shortness of breath.  No cough.  No hemoptysis. Cardiac:  No chest pain, palpitations, orthopnea, or PND. GI:  Appetite decreased.  Nausea.  No vomiting, diarrhea, melena or hematochezia. GU:  No urgency, frequency, dysuria, or hematuria. Musculoskeletal:  Back pain.  Severe rheumatoid arthritis (on prednisone).  Osteoporosis.  No back pain. No muscle tenderness. Extremities:  No pain or swelling. Skin:  No increased bruising or bleeding.  No rashes or skin changes. Neuro:  Neuropathy (stable).  No headache, numbness or weakness, balance or coordination issues. Endocrine:  Diabetes on an insulin pump.  Blood sugar elevated on steroids.  Occasional hot flashes.  Psych:  No mood changes,  depression or anxiety. Pain: Right lower lung discomfort (3 out of 10). Review of systems:  All other systems reviewed and found to be negative.  Physical Exam:  Blood pressure 111/63, pulse 80, temperature (!) 96.8 F (36 C), temperature source Tympanic, resp. rate 18, weight 134 lb 8 oz (61 kg). GENERAL:  Well developed, well nourished woman sitting comfortably in the exam room in no acute distress. MENTAL STATUS:  Alert and oriented to person, place and time. HEAD:  Wearing a cap.  Alopecia.  Normocephalic, atraumatic, face symmetric, no Cushingoid features. EYES:  Gold rimmed glasses.  Blue eyes.  No conjunctivitis or scleral icterus.  ENT: Oropharynx clear without lesion. Tongue normal. Mucous membranes moist.  RESPIRATORY: Decreased breath sounds right base with faint crackles on deep inspiration.  No wheezes or rhonchi. CARDIOVASCULAR: Regular rate and rhythm without murmur, rub or gallop. ABDOMEN: Soft, , non-tender with active bowel sounds and no splenomegaly. Slight fullness in RUQ.  No palpable nodularity or masses.  Insulin pump.  Ostomy bag. SKIN: No rashes or ulcers. EXTREMITIES:  No edema, skin discoloration or tenderness. No palpable cords. LYMPH NODES: No palpable cervical, supraclavicular, axillary or inguinal adenopathy  NEUROLOGICAL: Appropriate. PSYCH: Appropriate.   Radiology studies: 02/13/2015:  Abdomen and pelvic CT revealed bilateral mass-like adnexal regions (right adnexal mass 5.6 x 5.0 cm and the left adnexa mass 10.3 x 6.0 x 9.9 cm).  There was a large amount of soft tissue throughout the peritoneal cavity involving the omentum and other peritoneal surfaces.  There was a small volume ascites. There was a peripheral 3.3 x 1.9 cm low-attenuation lesion overlying the right lobe of the liver, likely representing a serosal implant. There was 1.4 x 2.2 cm ill-defined peripheral lesion within the inferior aspect of segment 6 adjacent to the ampulla in the  duodenum.  04/28/2015:  Abdominal and pelvic CT revealed decreasing bilateral ovarian masses.   The left adnexal mass measured 4.0 x 6.1 cm (previously 5.0 x 8.5 cm). The right ovary measured 3.8 x 4.9 cm (previously 4.7 x 5.6 cm).  There was improved peritoneal carcinomatosis.  There was a small amount of ascites.  The hepatic dome lesion was stable. The previously seen right hepatic lobe lesion was not well-visualized.  There was a small left pleural effusion and trace right pleural effusion.  The nodular lesion at the ampulla of Vater, extending into the duodenum was stable.  There was no biliary ductal dilatation. 08/01/2015:  Rght upper extremity ultrasound revealed a near occlusive thrombus within the central portion of the right internal jugular vein and central portion of the right subclavian vein. 09/01/2015:  Chest, abdomen, and pelvic CT revealed continued decrease in perihepatic fluid collection contiguous with the right pleural space with percutaneous drain.  09/11/2015:  Chest, abdomen, and pelvic CT revealed a residual versus recurrent fluid collection posterior to the right hepatic lobe (2.6 x 1.1 x 4.0 cm).   10/13/2015:  Chest, abdomen, and pelvic CT revealed resolution of the empyema. 02/15/2016:  Chest, abdomen, and pelvic CT revealed multiple soft tissue nodules throughout the pelvis, small bowel mesenteric and peritoneum and, concerning for peritoneal metastatic disease.  There was soft tissue irregularity within the left upper quadrant.  There was mild right hydronephrosis (etiology unclear).  There was interval resolution of previously described right pleural based fluid and gas collection. There was a pleural based nodule within the right lower hemi-thorax concerning for pleural based metastasis.  There were small bilateral pleural effusions.  There was pulmonary nodularity, predominately within the left upper lobe (metastatic or infectious/inflammatory etiology).  There was slightly  increased mediastinal adenopathy (infectious/inflammatory or metastatic).  There was a low attenuation lesion within the left hepatic lobe (complicated fluid within the fissure or metastatic disease). 03/08/2016:  Abdomen and pelvic CT revealed mild progression of peritoneal disease in the abdomen.  There was interval increase in loculated fluid around the lateral segment left liver, stomach, and spleen.  There was persistent soft tissue lesion at the level of the ampulla with mild intra and extrahepatic biliary duct dilatation.  There was persistent intrahepatic and capsular metastatic disease involving the liver. 05/26/2016:  Head MRI revealed multiple small areas of acute infarct involving the occipital parietal lobe bilaterally and the left lateral cerebellum,  consistent with posterior circulation emboli.  06/10/2016:  Adomen and pelvic CT revealed progression of peritoneal carcinomatosis predominantly in the perihepatic space, bilateral lower quadrants and pelvis.  There was worsening bilateral obstructive uropathy due to malignant involvement of the pelvic ureters.  There was stable small pleural/subpleural nodules at the right lung base.  There was stable small left pleural effusion.  There was decreased small volume perihepatic ascites.  08/09/2016:  Abdomen and pelvic CT revealed interval increase and loculated fluid collections within the peritoneal space consistent with progression of peritoneal ovarian carcinoma metastasis.  There was one large 7 cm collection in the RIGHT lower quadrant which had increased significantly in size and may represent of point of small bowel obstruction.  The proximal stomach and duodenum were decompressed. The mid small bowel was mildly dilated. There was concern for obstruction given the large intraperitoneal fluid collection.  There was increase in subcapsular hepatic metastatic fluid collections.  There was enlargement of rounded ampullary lesion in the second  portion duodenum concerning for metastatic lesion.  There was mild biliary duct dilatation (stable).  There was nodular pleural metastasis in the RIGHT lower lobe pleural space. 11/03/2016:  Abdomen and pelvic CT revealed multifocal cystic metastases in the abdomen/pelvis, reflecting peritoneal disease.  The dominant lesion in the left anterior abdomen has  progressed, while additional lesions were mixed.  Peritoneal disease along the liver and spleen progressed.  Pleural-based metastases along the posterior right hemithorax progressed. There were trace left pleural effusion.  There was a stable 1.7 cm duodenal lesion at the ampulla. 11/29/2016:  Abdomen and pelvic CT revealed mild interval enlargement of pleural metastasis the RIGHT lower lobe.  There was overall mild decrease in cystic peritoneal metastasis within the abdomen pelvis. The solid lesion at the terminal ileum was decreased in size. Subcapsular lesion in the liver was slightly decreased in size.  There was no evidence of disease progression the abdomen pelvis.  There was mild intrahepatic duct dilatation similar prior.  The ampullary lesion was slightly larger.  There was mild hydronephrosis and hydroureter on the LEFT likely related to cystic metastasis at the LEFT vesicoureteral junction 12/09/2016:  MRCP revealed the cystic lesion within the pancreatic head/uncinate process had decreased in size and demonstrated no suspicious characteristics. This can be presumed a pseudocyst or focus of side branch duct ectasia.  There was similar to slight progression of extensive peritoneal metastasis since 11/29/2016. Grossly similar abdominal nodal and right pleural metastasis.  There was chronic mild common duct dilatation with similar soft tissue fullness about the ampulla compared to 11/29/2016.  There was improved to resolved left-sided hydronephrosis. 01/20/2017:  Abdomen and pelvic CT revealed complex mixed interval changes in widespread metastatic  disease in the peritoneal cavity, lower chest and thoracic, abdominal and pelvic lymph nodes. Right lower chest pleural metastasis and several of the peritoneal tumor implants had increased.  There was new right inguinal lymphadenopathy.  Some of the peritoneal tumor implants had decreased.  The small dependent left pleural effusion was slightly increased .  The mild left hydroureteronephrosis was improved.  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   The chronic biliary ductal dilatation was stable .  There was no evidence of bowel obstruction.  Admissions: [facility] from 06/15/2015 - 06/22/2015.  She underwent exploratory laparotomy, lysis of adhesions, total abdominal hysterectomy with bilateral salpingo-oophorectomy, infracolic omentectomy, optimal tumor debulking(< 1 cm), recto-sigmoid resection with creation of end colostomy, cholecystectomy, mobilization of splenic flexure and liver with diaphragmatic stripping on 06/15/2015. The right diaphragm was cleared of tumor. During dissection, the diaphragm was entered and closed with sutures.  [place] from 07/01/2015 - 07/03/2015 with pleural effusion. [place] from 07/07/2015 - 07/09/2015 with recurrent pleural effusion. [place] from 07/29/2015 - 08/10/2015 with a right-sided empyema and liver abscess. She underwent CT-guided placement of a liver abscess drain on 07/30/2015.  Liver abscess culture grew out group B strep and Enterobacter which was sensitive to Zosyn. She was transitioned to ertapenem Colbert Ewing) prior to discharge.  She was readmitted to [facility] on 09/17/2015. 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  Head MRI on 05/26/2016 revealed multiple small areas of acute infarct involving the occipital parietal lobe bilaterally and the left lateral cerebellum,  consistent with posterior circulation emboli.  Work-up included a negative carotid ultrasound and echocardiogram. [facility] from 08/10/2016 - 08/14/2016 with a small  bowel obstruction.  She was managed conservatively. [place] from 09/27/2016 - 09/28/2016 with symptomatic anemia and diarrhea.   Appointment on 02/27/2017  Component Date Value Ref Range Status  . WBC 02/27/2017 2.3* 3.6 - 11.0 K/uL Final  . RBC 02/27/2017 3.62* 3.80 - 5.20 MIL/uL Final  . Hemoglobin 02/27/2017 10.2* 12.0 - 16.0 g/dL Final  . HCT 02/27/2017 30.6* 35.0 - 47.0 % Final  . MCV 02/27/2017 84.5  80.0 -  100.0 fL Final  . MCH 02/27/2017 28.3  26.0 - 34.0 pg Final  . MCHC 02/27/2017 33.5  32.0 - 36.0 g/dL Final  . RDW 02/27/2017 16.3* 11.5 - 14.5 % Final  . Platelets 02/27/2017 205  150 - 440 K/uL Final  . Neutrophils Relative % 02/27/2017 57  % Final  . Neutro Abs 02/27/2017 1.3* 1.4 - 6.5 K/uL Final  . Lymphocytes Relative 02/27/2017 22  % Final  . Lymphs Abs 02/27/2017 0.5* 1.0 - 3.6 K/uL Final  . Monocytes Relative 02/27/2017 20  % Final  . Monocytes Absolute 02/27/2017 0.5  0.2 - 0.9 K/uL Final  . Eosinophils Relative 02/27/2017 0  % Final  . Eosinophils Absolute 02/27/2017 0.0  0 - 0.7 K/uL Final  . Basophils Relative 02/27/2017 1  % Final  . Basophils Absolute 02/27/2017 0.0  0 - 0.1 K/uL Final  . Sodium 02/27/2017 131* 135 - 145 mmol/L Final  . Potassium 02/27/2017 3.9  3.5 - 5.1 mmol/L Final  . Chloride 02/27/2017 94* 101 - 111 mmol/L Final  . CO2 02/27/2017 28  22 - 32 mmol/L Final  . Glucose, Bld 02/27/2017 339* 65 - 99 mg/dL Final  . BUN 02/27/2017 21* 6 - 20 mg/dL Final  . Creatinine, Ser 02/27/2017 0.90  0.44 - 1.00 mg/dL Final  . Calcium 02/27/2017 8.7* 8.9 - 10.3 mg/dL Final  . Total Protein 02/27/2017 6.7  6.5 - 8.1 g/dL Final  . Albumin 02/27/2017 2.8* 3.5 - 5.0 g/dL Final  . AST 02/27/2017 19  15 - 41 U/L Final  . ALT 02/27/2017 11* 14 - 54 U/L Final  . Alkaline Phosphatase 02/27/2017 86  38 - 126 U/L Final  . Total Bilirubin 02/27/2017 0.7  0.3 - 1.2 mg/dL Final  . GFR calc non Af Amer 02/27/2017 >60  >60 mL/min Final  . GFR calc Af Amer 02/27/2017 >60   >60 mL/min Final   Comment: (NOTE) The eGFR has been calculated using the CKD EPI equation. This calculation has not been validated in all clinical situations. eGFR's persistently <60 mL/min signify possible Chronic Kidney Disease.   . Anion gap 02/27/2017 9  5 - 15 Final  . Magnesium 02/27/2017 1.1* 1.7 - 2.4 mg/dL Final    Assessment:  [name] is a 71 y.o. female with progressive stage IV ovarian cancer.  She presented with abdominal discomfort and bloating.  Omental biopsy on 02/23/2015 revealed metastatic high grade serous carcinoma, consistent with gynecologic origin.   She was initially diagnosed with clinical stage IIIC (T3cN1Mx).  CA125 was 707 on 02/17/2015.  She received 4 cycles of neoadjuvant carboplatin and Taxol (03/05/2015 - 05/22/2015).  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  She had loose stools on oral magnesium.  She was initially on Neurontin then switched Lyrica with cycle #3.  Cycle #4 was notable for neutropenia (ANC 300) requiring GCSF x 3 days.    She underwent exploratory laparotomy, lysis of adhesions, total abdominal hysterectomy with bilateral salpingo-oophorectomy, infracolic omentectomy, optimal tumor debulking(< 1 cm), recto-sigmoid resection with creation of end colostomy, cholecystectomy, mobilization of splenic flexure and liver with diaphragmatic stripping on 06/15/2015. The right diaphragm was cleared of tumor. During dissection, the diaphragm was entered and closed with sutures.  She received tamoxifen from 02/25/2016 - 03/14/2016.  She received 4 cycles of carboplatin and gemcitabine (03/21/2016 - 05/24/2016) with GCSF/Neulasta support.  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  Cymbalta began on 02/24/2017  PDL-1 testing revealed  a combined score was 5 (>=1 positive).     She received 2 cycles of Doxil (06/21/2016 - 07/19/2016) with Neulasta support.  She received 4 cycles of topotecan with Neulasta support  (08/22/2016 - 10/10/2016).    She received 3 cycles of pembrolizumab (Keytruda) from 12/06/2016 -  01/09/2017.  Cycle #1 was complicated by neutropenia (ANC 400) requiring GCSF/Granix x 3 days.  She received GCSF x 5 days with cycle #2.  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 Right lower chest pleural metastasis and several of the peritoneal tumor implants had increased.  There was new right inguinal lymphadenopathy.  Some of the peritoneal tumor implants had decreased.  The small dependent left pleural effusion was slightly increased .  The mild left hydroureteronephrosis was improved.  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   The chronic biliary ductal dilatation was stable .  There was no evidence of bowel obstruction.  CA125 was 802.9 on 03/30/2015, 567.9 on 04/13/2015, 168.8 on 05/15/2015, 85.2 on 07/17/2015, 68.6 on 07/28/2015, 34.5 on 09/17/2015, 20.7 on 11/06/2015, 49 on 01/22/2016, 106.1 on 02/22/2016, 138.8 on 03/07/2016, 220.8 on 03/21/2016, 152.8 on 04/11/2016, 125.6 on 04/18/2016, 76.8 on 05/03/2016, 60.2 on 05/24/2016, 44 on 07/19/2016, 65.8 on 08/17/2016, 53.4 on 09/12/2016, 47.3 on 10/10/2016, 91 on 12/20/2016, 141.1 on 01/09/2017, and 463.7 on 02/27/2017.  She has a history of recurrent right sided pleural effusion.  She underwent thoracentesis of 650 cc on post-operative day 3.  She was admitted to [facility] on 07/01/2015 and 07/07/2015 for recurrent shortness of breath.  She underwent 2 additional thoracenteses (1.1 L on 07/02/2015 and 850 cc on 07/08/2015).  Cytology was negative x 2.  Bilateral lower extremity duplex on 07/03/2015 was negative.  Echo revealed an EF of 55-60% on 07/08/2015 and 60-65% on 05/27/2016.    Rght upper extremity ultrasound on 08/01/2015 revealed a near occlusive thrombus within the central portion of the  right internal jugular vein and central portion of the right subclavian vein. She was on Lovenox 60 mg twice a day.  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  She has severe rheumatoid arthritis.  Methotrexate and Enbrel were initially on hold.  She has a normocytic anemia.  Work-up on 02/17/2015 and 05/24/2016 revealed a normal ferritin, B12, folate, TSH.  She denies any melena or hematochezia.    She has anemia due to chronic disease. She received 1 unit PRBCs during her admission at [facility]. She denies any melena or hematochezia. She has diabetes and is on an insulin pump.  She has had persistent neutropenia felt secondary to her rheumatoid arthritis.  Folate and MMA were normal.  TSH was 6.13 (high) with a free T4 of 1.17 (0.61-1.12).  She began methotrexate (10 mg a week) and prednisone (5 mg a day) for severe rheumatoid arthritis on 12/17/2015.  She remains on prednisone alone (5 mg a day).  Bone marrow aspirate and biopsy on 06/09/2010 revealed a hypercellular marrow (70%) with no evidence of dysplasia or malignancy.  Flow cytometry was negative.  Cytogenetics were normal (46,XX).  FISH studies were negative for MDS.  Bone marrow aspirate and biopsy on 12/03/2015 revealed a normocellular to mildy hypercellular marrow for age (40%) with left shifted myelopoiesis, non specific dyserythropoiesis and mild megakaryocytic atypia with no increase in blasts.  There were multiple small nonspecific lymphoid aggregates (  favor reactive). There was no increase in reticulin.  There was decreased myeloid cells (37%) with left shifted maturation and 1% atypical myelod blasts.  There was relatively increased monocytic cells (11%), relatively increased lymphoid cells (36%), and relatively increased eosinophils (6%).  Cytogenetics were normal (12, XX).  SNP microarray was normal.  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  She receives IV magnesium weekly.  She  has chemotherapy induced anemia.  She has received Procrit (last 11/07/2016).  Labs on 08/22/2016 revealed a normal B12 and folate.  Ferritin was 37, iron saturation 11% and TIBC 281.  Code status is DNR/DNI.  She developed acute LFT elevation on 11/28/2016.  Event was preceded by upper abdominal discomfort.  Hepatitis B core antibody total and hepatitis C antibody were negative.  Symptomatically, she feels "ok".  Exam reveals decreased breath sounds at the right base. WBC 2300 with an ANC of 1300. Hemoglobin is 10.2, hematocrit 30.6, and platelets 205,000.  Magnesium is 1.1 (low).  Plan: 1.  Labs today:  CBC with diff, CMP, Mg, CA125.  Add labs for anemia work-up:  B12, folate, vitamin D. 2.  Discuss clinical trial enrollment. Follow-up with clinical trials and with Dr. Theora Gianotti. 3.  Discuss consult with Dr Manuella Ghazi and peripheral neuropathy.  Cymbalta added. 4.  Magnesium continues to be low at 1.1. Will replace with 4 gm IV today, then recheck levels later this week.  5.  CXR and RIGHT decubitus today to assess for recurrent pleural effusion. 6.  RTC on 03/02/2017 for labs (Mg) and +/- magnesium infusion 7.  RTC in 1 week for MD assessment, labs (Mg), IV Mg, and discussion regarding direction of therapy.   Honor Loh, NP  02/27/2017, 9:14 AM   I saw and evaluated the patient, participating in the key portions of the service and reviewing pertinent diagnostic studies and records.  I reviewed the nurse practitioner's note and agree with the findings and the plan.  The assessment and plan were discussed with the patient.  Multiple questions were asked by the patient and answered.   Nolon Stalls, MD 02/27/2017,9:14 AM

## 2017-02-27 ENCOUNTER — Ambulatory Visit
Admission: RE | Admit: 2017-02-27 | Discharge: 2017-02-27 | Disposition: A | Discharge status: Home / Self Care | Payer: Medicare Other | Source: Ambulatory Visit | Attending: Hematology and Oncology | Admitting: Hematology and Oncology

## 2017-02-27 ENCOUNTER — Ambulatory Visit
Admission: RE | Admit: 2017-02-27 | Discharge: 2017-02-27 | Disposition: A | Discharge status: Home / Self Care | Payer: Medicare Other | Source: Ambulatory Visit | Attending: Urgent Care | Admitting: Urgent Care

## 2017-02-27 ENCOUNTER — Inpatient Hospital Stay: Payer: Medicare Other

## 2017-02-27 ENCOUNTER — Encounter: Payer: Self-pay | Admitting: Hematology and Oncology

## 2017-02-27 ENCOUNTER — Other Ambulatory Visit: Payer: Self-pay | Admitting: *Deleted

## 2017-02-27 ENCOUNTER — Inpatient Hospital Stay (HOSPITAL_BASED_OUTPATIENT_CLINIC_OR_DEPARTMENT_OTHER): Payer: Medicare Other | Admitting: Hematology and Oncology

## 2017-02-27 ENCOUNTER — Inpatient Hospital Stay: Payer: Medicare Other | Admitting: Hematology and Oncology

## 2017-02-27 VITALS — BP 111/63 | HR 80 | Temp 96.8°F | Resp 18 | Wt 134.5 lb

## 2017-02-27 DIAGNOSIS — Z90710 Acquired absence of both cervix and uterus: Secondary | ICD-10-CM | POA: Diagnosis not present

## 2017-02-27 DIAGNOSIS — C778 Secondary and unspecified malignant neoplasm of lymph nodes of multiple regions: Secondary | ICD-10-CM

## 2017-02-27 DIAGNOSIS — I509 Heart failure, unspecified: Secondary | ICD-10-CM

## 2017-02-27 DIAGNOSIS — R911 Solitary pulmonary nodule: Secondary | ICD-10-CM | POA: Insufficient documentation

## 2017-02-27 DIAGNOSIS — C800 Disseminated malignant neoplasm, unspecified: Principal | ICD-10-CM

## 2017-02-27 DIAGNOSIS — I252 Old myocardial infarction: Secondary | ICD-10-CM

## 2017-02-27 DIAGNOSIS — Z7189 Other specified counseling: Secondary | ICD-10-CM

## 2017-02-27 DIAGNOSIS — Z794 Long term (current) use of insulin: Secondary | ICD-10-CM | POA: Diagnosis not present

## 2017-02-27 DIAGNOSIS — R0781 Pleurodynia: Secondary | ICD-10-CM

## 2017-02-27 DIAGNOSIS — Z79899 Other long term (current) drug therapy: Secondary | ICD-10-CM

## 2017-02-27 DIAGNOSIS — Z9221 Personal history of antineoplastic chemotherapy: Secondary | ICD-10-CM | POA: Diagnosis not present

## 2017-02-27 DIAGNOSIS — I25810 Atherosclerosis of coronary artery bypass graft(s) without angina pectoris: Secondary | ICD-10-CM

## 2017-02-27 DIAGNOSIS — G629 Polyneuropathy, unspecified: Secondary | ICD-10-CM

## 2017-02-27 DIAGNOSIS — M069 Rheumatoid arthritis, unspecified: Secondary | ICD-10-CM | POA: Diagnosis not present

## 2017-02-27 DIAGNOSIS — I110 Hypertensive heart disease with heart failure: Secondary | ICD-10-CM

## 2017-02-27 DIAGNOSIS — J90 Pleural effusion, not elsewhere classified: Secondary | ICD-10-CM | POA: Insufficient documentation

## 2017-02-27 DIAGNOSIS — C561 Malignant neoplasm of right ovary: Secondary | ICD-10-CM

## 2017-02-27 DIAGNOSIS — C569 Malignant neoplasm of unspecified ovary: Secondary | ICD-10-CM

## 2017-02-27 DIAGNOSIS — J9811 Atelectasis: Secondary | ICD-10-CM | POA: Diagnosis not present

## 2017-02-27 DIAGNOSIS — E785 Hyperlipidemia, unspecified: Secondary | ICD-10-CM

## 2017-02-27 DIAGNOSIS — Z7952 Long term (current) use of systemic steroids: Secondary | ICD-10-CM

## 2017-02-27 DIAGNOSIS — Z7901 Long term (current) use of anticoagulants: Secondary | ICD-10-CM

## 2017-02-27 DIAGNOSIS — K219 Gastro-esophageal reflux disease without esophagitis: Secondary | ICD-10-CM

## 2017-02-27 DIAGNOSIS — E11621 Type 2 diabetes mellitus with foot ulcer: Secondary | ICD-10-CM

## 2017-02-27 DIAGNOSIS — Z90722 Acquired absence of ovaries, bilateral: Secondary | ICD-10-CM

## 2017-02-27 DIAGNOSIS — E559 Vitamin D deficiency, unspecified: Secondary | ICD-10-CM

## 2017-02-27 LAB — COMPREHENSIVE METABOLIC PANEL
ALK PHOS: 86 U/L (ref 38–126)
ALT: 11 U/L — AB (ref 14–54)
AST: 19 U/L (ref 15–41)
Albumin: 2.8 g/dL — ABNORMAL LOW (ref 3.5–5.0)
Anion gap: 9 (ref 5–15)
BUN: 21 mg/dL — AB (ref 6–20)
CALCIUM: 8.7 mg/dL — AB (ref 8.9–10.3)
CO2: 28 mmol/L (ref 22–32)
CREATININE: 0.9 mg/dL (ref 0.44–1.00)
Chloride: 94 mmol/L — ABNORMAL LOW (ref 101–111)
GFR calc non Af Amer: >60 mL/min (ref >60)
Glucose, Bld: 339 mg/dL — ABNORMAL HIGH (ref 65–99)
Potassium: 3.9 mmol/L (ref 3.5–5.1)
SODIUM: 131 mmol/L — AB (ref 135–145)
Total Bilirubin: 0.7 mg/dL (ref 0.3–1.2)
Total Protein: 6.7 g/dL (ref 6.5–8.1)

## 2017-02-27 LAB — CBC WITH DIFFERENTIAL/PLATELET
Basophils Absolute: 0 10*3/uL (ref 0–0.1)
Basophils Relative: 1 %
Eosinophils Absolute: 0 10*3/uL (ref 0–0.7)
Eosinophils Relative: 0 %
HCT: 30.6 % — ABNORMAL LOW (ref 35.0–47.0)
HEMOGLOBIN: 10.2 g/dL — AB (ref 12.0–16.0)
LYMPHS ABS: 0.5 10*3/uL — AB (ref 1.0–3.6)
LYMPHS PCT: 22 %
MCH: 28.3 pg (ref 26.0–34.0)
MCHC: 33.5 g/dL (ref 32.0–36.0)
MCV: 84.5 fL (ref 80.0–100.0)
Monocytes Absolute: 0.5 10*3/uL (ref 0.2–0.9)
Monocytes Relative: 20 %
NEUTROS PCT: 57 %
Neutro Abs: 1.3 10*3/uL — ABNORMAL LOW (ref 1.4–6.5)
Platelets: 205 10*3/uL (ref 150–440)
RBC: 3.62 MIL/uL — AB (ref 3.80–5.20)
RDW: 16.3 % — ABNORMAL HIGH (ref 11.5–14.5)
WBC: 2.3 10*3/uL — AB (ref 3.6–11.0)

## 2017-02-27 LAB — MAGNESIUM: Magnesium: 1.1 mg/dL — ABNORMAL LOW (ref 1.7–2.4)

## 2017-02-27 MED ORDER — MAGNESIUM SULFATE 4 GM/100ML IV SOLN
4.0000 g | Freq: Once | INTRAVENOUS | Status: AC
  Administered 2017-02-27: 4 g via INTRAVENOUS
  Filled 2017-02-27: qty 100

## 2017-02-27 MED ORDER — SODIUM CHLORIDE 0.9 % IV SOLN
INTRAVENOUS | Status: DC
  Administered 2017-02-27: 11:00:00 via INTRAVENOUS
  Filled 2017-02-27: qty 1000

## 2017-02-27 MED ORDER — HEPARIN SOD (PORK) LOCK FLUSH 100 UNIT/ML IV SOLN
500.0000 [IU] | Freq: Once | INTRAVENOUS | Status: AC
  Administered 2017-02-27: 500 [IU] via INTRAVENOUS
  Filled 2017-02-27: qty 5

## 2017-02-27 NOTE — Progress Notes (Signed)
Here for follow up. Stated she saw Dr Manuella Ghazi and started on Cymbalta for " neuropathy " also stated that " fluid pocket " in RLL is increasing and wanted to talk about having it "drained- Im having SOB " pleasant  ,husband w pt

## 2017-02-28 ENCOUNTER — Other Ambulatory Visit: Payer: Self-pay | Admitting: Urgent Care

## 2017-02-28 DIAGNOSIS — D649 Anemia, unspecified: Secondary | ICD-10-CM

## 2017-02-28 DIAGNOSIS — Z5111 Encounter for antineoplastic chemotherapy: Secondary | ICD-10-CM

## 2017-02-28 DIAGNOSIS — Z7189 Other specified counseling: Secondary | ICD-10-CM

## 2017-02-28 DIAGNOSIS — C801 Malignant (primary) neoplasm, unspecified: Secondary | ICD-10-CM

## 2017-02-28 DIAGNOSIS — C569 Malignant neoplasm of unspecified ovary: Secondary | ICD-10-CM

## 2017-02-28 DIAGNOSIS — C786 Secondary malignant neoplasm of retroperitoneum and peritoneum: Secondary | ICD-10-CM

## 2017-02-28 LAB — CA 125: Cancer Antigen (CA) 125: 463.7 U/mL — ABNORMAL HIGH (ref 0.0–38.1)

## 2017-02-28 MED ORDER — HEPARIN SOD (PORK) LOCK FLUSH 100 UNIT/ML IV SOLN
INTRAVENOUS | Status: AC
  Filled 2017-02-28: qty 5

## 2017-03-02 ENCOUNTER — Inpatient Hospital Stay: Payer: Medicare Other | Attending: Hematology and Oncology

## 2017-03-02 ENCOUNTER — Other Ambulatory Visit: Payer: Self-pay | Admitting: Hematology and Oncology

## 2017-03-02 ENCOUNTER — Inpatient Hospital Stay: Payer: Medicare Other

## 2017-03-02 DIAGNOSIS — I2510 Atherosclerotic heart disease of native coronary artery without angina pectoris: Secondary | ICD-10-CM | POA: Insufficient documentation

## 2017-03-02 DIAGNOSIS — E871 Hypo-osmolality and hyponatremia: Secondary | ICD-10-CM | POA: Insufficient documentation

## 2017-03-02 DIAGNOSIS — Z87442 Personal history of urinary calculi: Secondary | ICD-10-CM | POA: Insufficient documentation

## 2017-03-02 DIAGNOSIS — M069 Rheumatoid arthritis, unspecified: Secondary | ICD-10-CM | POA: Insufficient documentation

## 2017-03-02 DIAGNOSIS — R197 Diarrhea, unspecified: Secondary | ICD-10-CM | POA: Insufficient documentation

## 2017-03-02 DIAGNOSIS — G629 Polyneuropathy, unspecified: Secondary | ICD-10-CM | POA: Insufficient documentation

## 2017-03-02 DIAGNOSIS — I252 Old myocardial infarction: Secondary | ICD-10-CM | POA: Insufficient documentation

## 2017-03-02 DIAGNOSIS — C786 Secondary malignant neoplasm of retroperitoneum and peritoneum: Secondary | ICD-10-CM | POA: Diagnosis not present

## 2017-03-02 DIAGNOSIS — D6481 Anemia due to antineoplastic chemotherapy: Secondary | ICD-10-CM | POA: Diagnosis not present

## 2017-03-02 DIAGNOSIS — E108 Type 1 diabetes mellitus with unspecified complications: Secondary | ICD-10-CM | POA: Insufficient documentation

## 2017-03-02 DIAGNOSIS — R112 Nausea with vomiting, unspecified: Secondary | ICD-10-CM | POA: Insufficient documentation

## 2017-03-02 DIAGNOSIS — K219 Gastro-esophageal reflux disease without esophagitis: Secondary | ICD-10-CM | POA: Insufficient documentation

## 2017-03-02 DIAGNOSIS — R0602 Shortness of breath: Secondary | ICD-10-CM | POA: Insufficient documentation

## 2017-03-02 DIAGNOSIS — M549 Dorsalgia, unspecified: Secondary | ICD-10-CM | POA: Diagnosis not present

## 2017-03-02 DIAGNOSIS — C561 Malignant neoplasm of right ovary: Secondary | ICD-10-CM | POA: Diagnosis not present

## 2017-03-02 DIAGNOSIS — D709 Neutropenia, unspecified: Secondary | ICD-10-CM | POA: Insufficient documentation

## 2017-03-02 DIAGNOSIS — Z5111 Encounter for antineoplastic chemotherapy: Secondary | ICD-10-CM | POA: Insufficient documentation

## 2017-03-02 DIAGNOSIS — D61818 Other pancytopenia: Secondary | ICD-10-CM

## 2017-03-02 DIAGNOSIS — N1330 Unspecified hydronephrosis: Secondary | ICD-10-CM | POA: Insufficient documentation

## 2017-03-02 DIAGNOSIS — Z90722 Acquired absence of ovaries, bilateral: Secondary | ICD-10-CM | POA: Insufficient documentation

## 2017-03-02 DIAGNOSIS — K449 Diaphragmatic hernia without obstruction or gangrene: Secondary | ICD-10-CM | POA: Diagnosis not present

## 2017-03-02 DIAGNOSIS — R5383 Other fatigue: Secondary | ICD-10-CM | POA: Diagnosis not present

## 2017-03-02 DIAGNOSIS — Z87891 Personal history of nicotine dependence: Secondary | ICD-10-CM | POA: Insufficient documentation

## 2017-03-02 DIAGNOSIS — I10 Essential (primary) hypertension: Secondary | ICD-10-CM | POA: Diagnosis not present

## 2017-03-02 DIAGNOSIS — I509 Heart failure, unspecified: Secondary | ICD-10-CM | POA: Diagnosis not present

## 2017-03-02 DIAGNOSIS — R630 Anorexia: Secondary | ICD-10-CM | POA: Insufficient documentation

## 2017-03-02 DIAGNOSIS — Z79899 Other long term (current) drug therapy: Secondary | ICD-10-CM | POA: Insufficient documentation

## 2017-03-02 DIAGNOSIS — K5900 Constipation, unspecified: Secondary | ICD-10-CM | POA: Insufficient documentation

## 2017-03-02 DIAGNOSIS — D638 Anemia in other chronic diseases classified elsewhere: Secondary | ICD-10-CM | POA: Insufficient documentation

## 2017-03-02 DIAGNOSIS — R531 Weakness: Secondary | ICD-10-CM | POA: Diagnosis not present

## 2017-03-02 DIAGNOSIS — E860 Dehydration: Secondary | ICD-10-CM | POA: Diagnosis not present

## 2017-03-02 DIAGNOSIS — D72819 Decreased white blood cell count, unspecified: Secondary | ICD-10-CM | POA: Insufficient documentation

## 2017-03-02 DIAGNOSIS — Z801 Family history of malignant neoplasm of trachea, bronchus and lung: Secondary | ICD-10-CM | POA: Insufficient documentation

## 2017-03-02 DIAGNOSIS — C569 Malignant neoplasm of unspecified ovary: Secondary | ICD-10-CM

## 2017-03-02 DIAGNOSIS — Z90710 Acquired absence of both cervix and uterus: Secondary | ICD-10-CM | POA: Insufficient documentation

## 2017-03-02 DIAGNOSIS — E876 Hypokalemia: Secondary | ICD-10-CM | POA: Insufficient documentation

## 2017-03-02 DIAGNOSIS — Z8701 Personal history of pneumonia (recurrent): Secondary | ICD-10-CM | POA: Insufficient documentation

## 2017-03-02 DIAGNOSIS — Z9641 Presence of insulin pump (external) (internal): Secondary | ICD-10-CM | POA: Insufficient documentation

## 2017-03-02 LAB — CBC WITH DIFFERENTIAL/PLATELET
Basophils Absolute: 0 10*3/uL (ref 0–0.1)
Basophils Relative: 0 %
Eosinophils Absolute: 0 10*3/uL (ref 0–0.7)
Eosinophils Relative: 0 %
HCT: 31.1 % — ABNORMAL LOW (ref 35.0–47.0)
Hemoglobin: 10.4 g/dL — ABNORMAL LOW (ref 12.0–16.0)
Lymphocytes Relative: 18 %
Lymphs Abs: 0.4 10*3/uL — ABNORMAL LOW (ref 1.0–3.6)
MCH: 28 pg (ref 26.0–34.0)
MCHC: 33.5 g/dL (ref 32.0–36.0)
MCV: 83.7 fL (ref 80.0–100.0)
Monocytes Absolute: 0.4 10*3/uL (ref 0.2–0.9)
Monocytes Relative: 18 %
Neutro Abs: 1.4 10*3/uL (ref 1.4–6.5)
Neutrophils Relative %: 64 %
Platelets: 228 10*3/uL (ref 150–440)
RBC: 3.72 MIL/uL — ABNORMAL LOW (ref 3.80–5.20)
RDW: 16.7 % — ABNORMAL HIGH (ref 11.5–14.5)
WBC: 2.2 10*3/uL — ABNORMAL LOW (ref 3.6–11.0)

## 2017-03-02 LAB — MAGNESIUM: Magnesium: 1.4 mg/dL — ABNORMAL LOW (ref 1.7–2.4)

## 2017-03-02 MED ORDER — SODIUM CHLORIDE 0.9 % IV SOLN
Freq: Once | INTRAVENOUS | Status: AC
  Administered 2017-03-02: 15:00:00 via INTRAVENOUS
  Filled 2017-03-02: qty 1000

## 2017-03-02 MED ORDER — HEPARIN SOD (PORK) LOCK FLUSH 100 UNIT/ML IV SOLN
500.0000 [IU] | Freq: Once | INTRAVENOUS | Status: AC
  Administered 2017-03-02: 500 [IU] via INTRAVENOUS
  Filled 2017-03-02: qty 5

## 2017-03-02 MED ORDER — MAGNESIUM SULFATE 4 GM/100ML IV SOLN
4.0000 g | Freq: Once | INTRAVENOUS | Status: AC
  Administered 2017-03-02: 4 g via INTRAVENOUS
  Filled 2017-03-02: qty 100

## 2017-03-06 ENCOUNTER — Other Ambulatory Visit: Payer: Self-pay | Admitting: Urgent Care

## 2017-03-06 ENCOUNTER — Other Ambulatory Visit: Payer: Self-pay | Admitting: *Deleted

## 2017-03-06 ENCOUNTER — Inpatient Hospital Stay: Payer: Medicare Other

## 2017-03-06 ENCOUNTER — Inpatient Hospital Stay (HOSPITAL_BASED_OUTPATIENT_CLINIC_OR_DEPARTMENT_OTHER): Payer: Medicare Other | Admitting: Hematology and Oncology

## 2017-03-06 VITALS — BP 131/65 | HR 93 | Temp 98.0°F | Resp 18 | Wt 136.4 lb

## 2017-03-06 DIAGNOSIS — Z9641 Presence of insulin pump (external) (internal): Secondary | ICD-10-CM

## 2017-03-06 DIAGNOSIS — C561 Malignant neoplasm of right ovary: Secondary | ICD-10-CM

## 2017-03-06 DIAGNOSIS — D649 Anemia, unspecified: Secondary | ICD-10-CM

## 2017-03-06 DIAGNOSIS — M549 Dorsalgia, unspecified: Secondary | ICD-10-CM

## 2017-03-06 DIAGNOSIS — C800 Disseminated malignant neoplasm, unspecified: Secondary | ICD-10-CM

## 2017-03-06 DIAGNOSIS — C801 Malignant (primary) neoplasm, unspecified: Secondary | ICD-10-CM

## 2017-03-06 DIAGNOSIS — I10 Essential (primary) hypertension: Secondary | ICD-10-CM

## 2017-03-06 DIAGNOSIS — I2510 Atherosclerotic heart disease of native coronary artery without angina pectoris: Secondary | ICD-10-CM

## 2017-03-06 DIAGNOSIS — D638 Anemia in other chronic diseases classified elsewhere: Secondary | ICD-10-CM

## 2017-03-06 DIAGNOSIS — R0602 Shortness of breath: Secondary | ICD-10-CM

## 2017-03-06 DIAGNOSIS — K5900 Constipation, unspecified: Secondary | ICD-10-CM

## 2017-03-06 DIAGNOSIS — D6481 Anemia due to antineoplastic chemotherapy: Secondary | ICD-10-CM

## 2017-03-06 DIAGNOSIS — I509 Heart failure, unspecified: Secondary | ICD-10-CM

## 2017-03-06 DIAGNOSIS — Z90722 Acquired absence of ovaries, bilateral: Secondary | ICD-10-CM

## 2017-03-06 DIAGNOSIS — I252 Old myocardial infarction: Secondary | ICD-10-CM

## 2017-03-06 DIAGNOSIS — Z7189 Other specified counseling: Secondary | ICD-10-CM

## 2017-03-06 DIAGNOSIS — E108 Type 1 diabetes mellitus with unspecified complications: Secondary | ICD-10-CM

## 2017-03-06 DIAGNOSIS — C786 Secondary malignant neoplasm of retroperitoneum and peritoneum: Secondary | ICD-10-CM

## 2017-03-06 DIAGNOSIS — R5383 Other fatigue: Secondary | ICD-10-CM | POA: Diagnosis not present

## 2017-03-06 DIAGNOSIS — C569 Malignant neoplasm of unspecified ovary: Secondary | ICD-10-CM

## 2017-03-06 DIAGNOSIS — D709 Neutropenia, unspecified: Secondary | ICD-10-CM | POA: Diagnosis not present

## 2017-03-06 DIAGNOSIS — Z90710 Acquired absence of both cervix and uterus: Secondary | ICD-10-CM

## 2017-03-06 DIAGNOSIS — K219 Gastro-esophageal reflux disease without esophagitis: Secondary | ICD-10-CM

## 2017-03-06 DIAGNOSIS — Z8701 Personal history of pneumonia (recurrent): Secondary | ICD-10-CM

## 2017-03-06 DIAGNOSIS — Z801 Family history of malignant neoplasm of trachea, bronchus and lung: Secondary | ICD-10-CM

## 2017-03-06 DIAGNOSIS — D72819 Decreased white blood cell count, unspecified: Secondary | ICD-10-CM

## 2017-03-06 DIAGNOSIS — Z87442 Personal history of urinary calculi: Secondary | ICD-10-CM

## 2017-03-06 DIAGNOSIS — Z87891 Personal history of nicotine dependence: Secondary | ICD-10-CM

## 2017-03-06 DIAGNOSIS — D61818 Other pancytopenia: Secondary | ICD-10-CM

## 2017-03-06 DIAGNOSIS — Z79899 Other long term (current) drug therapy: Secondary | ICD-10-CM

## 2017-03-06 DIAGNOSIS — R531 Weakness: Secondary | ICD-10-CM | POA: Diagnosis not present

## 2017-03-06 DIAGNOSIS — K449 Diaphragmatic hernia without obstruction or gangrene: Secondary | ICD-10-CM

## 2017-03-06 DIAGNOSIS — E876 Hypokalemia: Secondary | ICD-10-CM

## 2017-03-06 DIAGNOSIS — N1330 Unspecified hydronephrosis: Secondary | ICD-10-CM

## 2017-03-06 DIAGNOSIS — G629 Polyneuropathy, unspecified: Secondary | ICD-10-CM

## 2017-03-06 DIAGNOSIS — M069 Rheumatoid arthritis, unspecified: Secondary | ICD-10-CM

## 2017-03-06 LAB — CBC WITH DIFFERENTIAL/PLATELET
Basophils Absolute: 0 10*3/uL (ref 0–0.1)
Basophils Relative: 1 %
Eosinophils Absolute: 0 10*3/uL (ref 0–0.7)
Eosinophils Relative: 0 %
HCT: 31 % — ABNORMAL LOW (ref 35.0–47.0)
Hemoglobin: 10.3 g/dL — ABNORMAL LOW (ref 12.0–16.0)
Lymphocytes Relative: 11 %
Lymphs Abs: 0.3 10*3/uL — ABNORMAL LOW (ref 1.0–3.6)
MCH: 27.9 pg (ref 26.0–34.0)
MCHC: 33.3 g/dL (ref 32.0–36.0)
MCV: 83.6 fL (ref 80.0–100.0)
Monocytes Absolute: 0.4 10*3/uL (ref 0.2–0.9)
Monocytes Relative: 18 %
Neutro Abs: 1.7 10*3/uL (ref 1.4–6.5)
Neutrophils Relative %: 70 %
Platelets: 221 10*3/uL (ref 150–440)
RBC: 3.7 MIL/uL — ABNORMAL LOW (ref 3.80–5.20)
RDW: 16.4 % — ABNORMAL HIGH (ref 11.5–14.5)
WBC: 2.4 10*3/uL — ABNORMAL LOW (ref 3.6–11.0)

## 2017-03-06 LAB — FOLATE: FOLATE: 44 ng/mL (ref >5.9)

## 2017-03-06 LAB — FERRITIN: Ferritin: 155 ng/mL (ref 11–307)

## 2017-03-06 LAB — MAGNESIUM: Magnesium: 1.3 mg/dL — ABNORMAL LOW (ref 1.7–2.4)

## 2017-03-06 LAB — IRON AND TIBC
Iron: 20 ug/dL — ABNORMAL LOW (ref 28–170)
SATURATION RATIOS: 8 % — AB (ref 10.4–31.8)
TIBC: 253 ug/dL (ref 250–450)
UIBC: 233 ug/dL

## 2017-03-06 LAB — VITAMIN B12: VITAMIN B 12: 296 pg/mL (ref 180–914)

## 2017-03-06 MED ORDER — MAGNESIUM SULFATE 4 GM/100ML IV SOLN
4.0000 g | Freq: Once | INTRAVENOUS | Status: AC
  Administered 2017-03-06: 4 g via INTRAVENOUS
  Filled 2017-03-06: qty 100

## 2017-03-06 MED ORDER — SODIUM CHLORIDE 0.9 % IV SOLN
Freq: Once | INTRAVENOUS | Status: AC
  Administered 2017-03-06: 15:00:00 via INTRAVENOUS
  Filled 2017-03-06: qty 1000

## 2017-03-06 MED ORDER — HEPARIN SOD (PORK) LOCK FLUSH 100 UNIT/ML IV SOLN
500.0000 [IU] | Freq: Once | INTRAVENOUS | Status: AC
  Administered 2017-03-06: 500 [IU] via INTRAVENOUS
  Filled 2017-03-06: qty 5

## 2017-03-06 NOTE — Progress Notes (Signed)
Here for follow up

## 2017-03-06 NOTE — Progress Notes (Signed)
[facility] day:  03/06/2017   Chief Complaint: [name] is a 71 y.o. female with stage IV ovarian cancer who is seen for 1 week assessment.  HPI:  The patient was last seen in the medical oncology clinic on 02/27/2017.  At that time, she felt short of breath and noted nausea and pain in her back.  She had lost 2 pounds.  CXR revealed bibasilar atelectasis and small bilateral pleural effusions.  There was a 2.3 cm right lung nodule.  CA125 was 463.7.  Magnesium was 1.1.  She received 4 gm of IV magnesium.  Follow-up magnesium was 1.4 on 03/02/2017.  She received an additional 4 gm of magnesium.  During the interim, patient continues to feel weak and tired. Patient continues to have daily nausea. She still has pain in her back and shortness of breath. Patient experiencing constipation, which she attributes to the Cymbalta. Patient planning on starting Colace today. She has experienced no B symptoms or interval infections.   To date, patient has not heard from [facility] regarding clinical trial inclusion.    Past Medical History:  Diagnosis Date  . CAD (coronary artery disease)    a. 09/2012 Cath: LM nl, LAD 95p, 30m LCX 972mOM2 50, RCA 100.  . Marland KitchenHF (congestive heart failure) ([facility] [place]  . Cholelithiasis   . Collagen vascular disease (HCC)    Rheumatoid Arthritis.  . Esophageal stricture   . Exertional shortness of breath   . GERD (gastroesophageal reflux disease)   . H/O hiatal hernia   . Herniated disc   . History of pancytopenia   . Hyperlipidemia   . Hypertension   . Hypokalemia   . Leukopenia 2012   s/p bone marrow biopsy, Dr. PaMa Hillock. NSTEMI (non-ST elevated myocardial infarction) ([facility]5/2014   "mild" (10/18/2012)  . Ovarian cancer ([facility]   chemo and hysterectomy  . Pneumonia 2013; 08/2012   "one lung; double" (10/18/2012)  . Rheumatoid arthritis(714.0)   . Type I diabetes mellitus ([facility]   "dx'd  in 1957" (10/18/2012)    Past Surgical History:  Procedure Laterality Date  . ABDOMINAL HYSTERECTOMY  06/15/2015   Dx L/S, EXLAP TAH BSO omentectomy RSRx colostomy diaphragm resection stripping  . CARDIAC CATHETERIZATION  10/18/2012   "first one was today" (10/18/2012)  . CATARACT EXTRACTION W/ INTRAOCULAR LENS IMPLANT Right 2010  . CHOLECYSTECTOMY  06/15/2015   combined case with ovarian cancer debulking  . COLON SURGERY    . ESOPHAGEAL DILATION     "3 or 4 times" (10/19/2011)  . ESOPHAGOGASTRODUODENOSCOPY  2012   Dr. IfMinna Merritts. OSTOMY    . OVARY SURGERY     removal  . TUBAL LIGATION  1970    Family History  Problem Relation Age of Onset  . Diabetes Mother   . Arthritis Mother   . Diabetes Father   . Arthritis Father   . Aneurysm Sister        neck  . Cancer Sister        lung    Social History:  reports that she quit smoking about 22 years ago. Her smoking use included cigarettes. She has a 30.00 pack-year smoking history. she has never used smokeless tobacco. She reports that she does not drink alcohol or use drugs.  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 She is accompanied by her husband today.  Allergies:  Allergies  Allergen Reactions  . Codeine Nausea And Vomiting  . Latex Rash    Current Medications: Current Outpatient Medications  Medication Sig Dispense Refill  . Calcium-Vitamin D 600-200 MG-UNIT tablet Take 1 tablet by mouth 2 (two) times daily.     . cholecalciferol (VITAMIN D) 1000 units tablet Take 1,000 Units by mouth daily.    . DULoxetine (CYMBALTA) 20 MG capsule Take by mouth.    . enoxaparin (LOVENOX) 60 MG/0.6ML injection INJECT 0.6 ML (60 MG TOTAL) INTO THE SKIN EVERY 12 HOURS 60 mL 0  . folic acid (FOLVITE) 1 MG tablet Take 1 tablet (1 mg total) by mouth daily. 90 tablet 3  . insulin lispro (HUMALOG) 100 UNIT/ML injection Inject 0.06 mLs (6 Units total) into the skin daily. (Patient taking  differently: Inject 0-85 Units into the skin daily. Via insulin pump) 30 mL 3  . lidocaine-prilocaine (EMLA) cream APPLY 1 APPLICATION TOPICALLY AS NEEDED 1 HOUR PRIOR TO TREATMENT. COVER WITH PRESS N SEAL UNTIL TREATMENT TIME AS DIRECTED 30 g 1  . magnesium oxide (MAG-OX) 400 (241.3 Mg) MG tablet TAKE 1 TABLET BY MOUTH ONCE DAILY 30 tablet 1  . metoprolol tartrate (LOPRESSOR) 25 MG tablet Take 1 tablet (25 mg total) by mouth 2 (two) times daily. 180 tablet 3  . pantoprazole (PROTONIX) 40 MG tablet TAKE 1 TABLET BY MOUTH TWICE (2) DAILY 180 tablet 1  . potassium chloride SA (K-DUR,KLOR-CON) 20 MEQ tablet TAKE 1 TABLET BY MOUTH ONCE A DAY 30 tablet 0  . predniSONE (DELTASONE) 5 MG tablet Take 5 mg by mouth daily with breakfast.    . vitamin C (ASCORBIC ACID) 500 MG tablet Take 500 mg by mouth daily.    Marland Kitchen zinc gluconate 50 MG tablet Take 50 mg daily by mouth.    . furosemide (LASIX) 80 MG tablet Take 0.5 tablets by mouth 2 (two) times daily.    Marland Kitchen loratadine (CLARITIN) 10 MG tablet Take 1 tablet by mouth daily as needed.    . ondansetron (ZOFRAN) 8 MG tablet TAKE 1 TABLET BY MOUTH EVERY 8 HOURS AS NEEDED FOR NAUSEA OR VOMITING (Patient not taking: Reported on 02/22/2017) 30 tablet 1  . Zinc 15 MG CAPS Take by mouth.      Current Facility-Administered Medications  Medication Dose Route Frequency Provider Last Rate Last Dose  . Tbo-Filgrastim (GRANIX) injection 300 mcg  300 mcg Subcutaneous Once Lequita Asal, MD       Facility-Administered Medications Ordered in Other Visits  Medication Dose Route Frequency Provider Last Rate Last Dose  . sodium chloride 0.9 % injection 10 mL  10 mL Intracatheter PRN Corcoran, Melissa C, MD      . sodium chloride 0.9 % injection 10 mL  10 mL Intravenous PRN Lequita Asal, MD   10 mL at 04/13/15 2683    Review of Systems:  GENERAL:  Feels "ok".  No fever, chills or sweats.  Weight up 2 pounds. Marland Kitchen PERFORMANCE STATUS (ECOG):  1 HEENT:  No visual  changes, sore throat, mouth sores or tenderness. Lungs:  Shortness of breath.  No cough.  No hemoptysis. Cardiac:  No chest pain, palpitations, orthopnea, or PND. GI:  Appetite decreased.  Nausea.  No vomiting, diarrhea, melena or hematochezia. GU:  No urgency, frequency, dysuria, or hematuria. Musculoskeletal:  Back pain.  Severe rheumatoid arthritis (on prednisone).  Osteoporosis.  No back pain. No muscle tenderness. Extremities:  No pain or swelling. Skin:  No increased bruising or bleeding.  No rashes or skin changes. Neuro:  Neuropathy (stable).  No headache, numbness or weakness, balance or coordination issues. Endocrine:  Diabetes on an insulin pump.  Blood sugar elevated on steroids.  Occasional hot flashes.  Psych:  No mood changes, depression or anxiety. Pain:  No pain. Review of systems:  All other systems reviewed and found to be negative.  Physical Exam:  Blood pressure 131/65, pulse 93, temperature 98 F (36.7 C), temperature source Tympanic, resp. rate 18, weight 136 lb 6.4 oz (61.9 kg). GENERAL:  Well developed, well nourished woman sitting comfortably in the exam room in no acute distress. MENTAL STATUS:  Alert and oriented to person, place and time. HEAD:  Wearing a cap.  Alopecia.  Normocephalic, atraumatic, face symmetric, no Cushingoid features. EYES:  Gold rimmed glasses.  Blue eyes.  No conjunctivitis or scleral icterus.  ENT: Oropharynx clear without lesion. Tongue normal. Mucous membranes moist.  RESPIRATORY: Decreased breath sounds right base with faint crackles on deep inspiration.  No wheezes or rhonchi. CARDIOVASCULAR: Regular rate and rhythm without murmur, rub or gallop. ABDOMEN: Soft, , non-tender with active bowel sounds and no splenomegaly. Slight fullness in RUQ.  No palpable nodularity or masses.  Insulin pump.  Ostomy bag. SKIN: No rashes or ulcers. EXTREMITIES:  No edema, skin discoloration or tenderness. No palpable cords. LYMPH NODES: No  palpable cervical, supraclavicular, axillary or inguinal adenopathy  NEUROLOGICAL: Appropriate. PSYCH: Appropriate.   Radiology studies: 02/13/2015:  Abdomen and pelvic CT revealed bilateral mass-like adnexal regions (right adnexal mass 5.6 x 5.0 cm and the left adnexa mass 10.3 x 6.0 x 9.9 cm).  There was a large amount of soft tissue throughout the peritoneal cavity involving the omentum and other peritoneal surfaces.  There was a small volume ascites. There was a peripheral 3.3 x 1.9 cm low-attenuation lesion overlying the right lobe of the liver, likely representing a serosal implant. There was 1.4 x 2.2 cm ill-defined peripheral lesion within the inferior aspect of segment 6 adjacent to the ampulla in the duodenum.  04/28/2015:  Abdominal and pelvic CT revealed decreasing bilateral ovarian masses.   The left adnexal mass measured 4.0 x 6.1 cm (previously 5.0 x 8.5 cm). The right ovary measured 3.8 x 4.9 cm (previously 4.7 x 5.6 cm).  There was improved peritoneal carcinomatosis.  There was a small amount of ascites.  The hepatic dome lesion was stable. The previously seen right hepatic lobe lesion was not well-visualized.  There was a small left pleural effusion and trace right pleural effusion.  The nodular lesion at the ampulla of Vater, extending into the duodenum was stable.  There was no biliary ductal dilatation. 08/01/2015:  Rght upper extremity ultrasound revealed a near occlusive thrombus within the central portion of the right internal jugular vein and central portion of the right subclavian vein. 09/01/2015:  Chest, abdomen, and pelvic CT revealed continued decrease in perihepatic fluid collection contiguous with the right pleural space with percutaneous drain.  09/11/2015:  Chest, abdomen, and pelvic CT revealed a residual versus recurrent fluid collection posterior to the right hepatic lobe (2.6 x 1.1 x 4.0 cm).   10/13/2015:  Chest, abdomen, and pelvic CT revealed resolution of the  empyema. 02/15/2016:  Chest, abdomen, and pelvic CT revealed multiple soft tissue nodules throughout the pelvis, small bowel mesenteric and peritoneum and, concerning for peritoneal metastatic disease.  There was soft tissue irregularity within the left upper quadrant.  There was mild right hydronephrosis (etiology unclear).  There was interval resolution  of previously described right pleural based fluid and gas collection. There was a pleural based nodule within the right lower hemi-thorax concerning for pleural based metastasis.  There were small bilateral pleural effusions.  There was pulmonary nodularity, predominately within the left upper lobe (metastatic or infectious/inflammatory etiology).  There was slightly increased mediastinal adenopathy (infectious/inflammatory or metastatic).  There was a low attenuation lesion within the left hepatic lobe (complicated fluid within the fissure or metastatic disease). 03/08/2016:  Abdomen and pelvic CT revealed mild progression of peritoneal disease in the abdomen.  There was interval increase in loculated fluid around the lateral segment left liver, stomach, and spleen.  There was persistent soft tissue lesion at the level of the ampulla with mild intra and extrahepatic biliary duct dilatation.  There was persistent intrahepatic and capsular metastatic disease involving the liver. 05/26/2016:  Head MRI revealed multiple small areas of acute infarct involving the occipital parietal lobe bilaterally and the left lateral cerebellum,  consistent with posterior circulation emboli.  06/10/2016:  Adomen and pelvic CT revealed progression of peritoneal carcinomatosis predominantly in the perihepatic space, bilateral lower quadrants and pelvis.  There was worsening bilateral obstructive uropathy due to malignant involvement of the pelvic ureters.  There was stable small pleural/subpleural nodules at the right lung base.  There was stable small left pleural effusion.  There  was decreased small volume perihepatic ascites.  08/09/2016:  Abdomen and pelvic CT revealed interval increase and loculated fluid collections within the peritoneal space consistent with progression of peritoneal ovarian carcinoma metastasis.  There was one large 7 cm collection in the RIGHT lower quadrant which had increased significantly in size and may represent of point of small bowel obstruction.  The proximal stomach and duodenum were decompressed. The mid small bowel was mildly dilated. There was concern for obstruction given the large intraperitoneal fluid collection.  There was increase in subcapsular hepatic metastatic fluid collections.  There was enlargement of rounded ampullary lesion in the second portion duodenum concerning for metastatic lesion.  There was mild biliary duct dilatation (stable).  There was nodular pleural metastasis in the RIGHT lower lobe pleural space. 11/03/2016:  Abdomen and pelvic CT revealed multifocal cystic metastases in the abdomen/pelvis, reflecting peritoneal disease.  The dominant lesion in the left anterior abdomen has progressed, while additional lesions were mixed.  Peritoneal disease along the liver and spleen progressed.  Pleural-based metastases along the posterior right hemithorax progressed. There were trace left pleural effusion.  There was a stable 1.7 cm duodenal lesion at the ampulla. 11/29/2016:  Abdomen and pelvic CT revealed mild interval enlargement of pleural metastasis the RIGHT lower lobe.  There was overall mild decrease in cystic peritoneal metastasis within the abdomen pelvis. The solid lesion at the terminal ileum was decreased in size. Subcapsular lesion in the liver was slightly decreased in size.  There was no evidence of disease progression the abdomen pelvis.  There was mild intrahepatic duct dilatation similar prior.  The ampullary lesion was slightly larger.  There was mild hydronephrosis and hydroureter on the LEFT likely related to cystic  metastasis at the LEFT vesicoureteral junction 12/09/2016:  MRCP revealed the cystic lesion within the pancreatic head/uncinate process had decreased in size and demonstrated no suspicious characteristics. This can be presumed a pseudocyst or focus of side branch duct ectasia.  There was similar to slight progression of extensive peritoneal metastasis since 11/29/2016. Grossly similar abdominal nodal and right pleural metastasis.  There was chronic mild common duct dilatation with similar soft tissue  fullness about the ampulla compared to 11/29/2016.  There was improved to resolved left-sided hydronephrosis. 01/20/2017:  Abdomen and pelvic CT revealed complex mixed interval changes in widespread metastatic disease in the peritoneal cavity, lower chest and thoracic, abdominal and pelvic lymph nodes. Right lower chest pleural metastasis and several of the peritoneal tumor implants had increased.  There was new right inguinal lymphadenopathy.  Some of the peritoneal tumor implants had decreased.  The small dependent left pleural effusion was slightly increased .  The mild left hydroureteronephrosis was improved.  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   The chronic biliary ductal dilatation was stable .  There was no evidence of bowel obstruction.  Admissions: [place] from 06/15/2015 - 06/22/2015.  She underwent exploratory laparotomy, lysis of adhesions, total abdominal hysterectomy with bilateral salpingo-oophorectomy, infracolic omentectomy, optimal tumor debulking(< 1 cm), recto-sigmoid resection with creation of end colostomy, cholecystectomy, mobilization of splenic flexure and liver with diaphragmatic stripping on 06/15/2015. The right diaphragm was cleared of tumor. During dissection, the diaphragm was entered and closed with sutures.  [place] from 07/01/2015 - 07/03/2015 with pleural effusion. [place] from 07/07/2015 - 07/09/2015 with recurrent pleural effusion. [place] from 07/29/2015 - 08/10/2015 with a  right-sided empyema and liver abscess. She underwent CT-guided placement of a liver abscess drain on 07/30/2015.  Liver abscess culture grew out group B strep and Enterobacter which was sensitive to Zosyn. She was transitioned to ertapenem Colbert Ewing) prior to discharge.  She was readmitted to [facility] on 09/17/2015. 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  Head MRI on 05/26/2016 revealed multiple small areas of acute infarct involving the occipital parietal lobe bilaterally and the left lateral cerebellum,  consistent with posterior circulation emboli.  Work-up included a negative carotid ultrasound and echocardiogram. [facility] from 08/10/2016 - 08/14/2016 with a small bowel obstruction.  She was managed conservatively. [place] from 09/27/2016 - 09/28/2016 with symptomatic anemia and diarrhea.   Appointment on 03/06/2017  Component Date Value Ref Range Status  . Magnesium 03/06/2017 1.3* 1.7 - 2.4 mg/dL Final  . WBC 03/06/2017 2.4* 3.6 - 11.0 K/uL Final  . RBC 03/06/2017 3.70* 3.80 - 5.20 MIL/uL Final  . Hemoglobin 03/06/2017 10.3* 12.0 - 16.0 g/dL Final  . HCT 03/06/2017 31.0* 35.0 - 47.0 % Final  . MCV 03/06/2017 83.6  80.0 - 100.0 fL Final  . MCH 03/06/2017 27.9  26.0 - 34.0 pg Final  . MCHC 03/06/2017 33.3  32.0 - 36.0 g/dL Final  . RDW 03/06/2017 16.4* 11.5 - 14.5 % Final  . Platelets 03/06/2017 221  150 - 440 K/uL Final  . Neutrophils Relative % 03/06/2017 70  % Final  . Neutro Abs 03/06/2017 1.7  1.4 - 6.5 K/uL Final  . Lymphocytes Relative 03/06/2017 11  % Final  . Lymphs Abs 03/06/2017 0.3* 1.0 - 3.6 K/uL Final  . Monocytes Relative 03/06/2017 18  % Final  . Monocytes Absolute 03/06/2017 0.4  0.2 - 0.9 K/uL Final  . Eosinophils Relative 03/06/2017 0  % Final  . Eosinophils Absolute 03/06/2017 0.0  0 - 0.7 K/uL Final  . Basophils Relative 03/06/2017 1  % Final  . Basophils Absolute 03/06/2017 0.0  0 - 0.1 K/uL Final    Assessment:  [name] is a 71 y.o. female with progressive stage IV ovarian cancer.  She presented with abdominal discomfort and bloating.  Omental biopsy on 02/23/2015 revealed metastatic high grade serous carcinoma, consistent with gynecologic origin.   She was  initially diagnosed with clinical stage IIIC (T3cN1Mx).  CA125 was 707 on 02/17/2015.  She received 4 cycles of neoadjuvant carboplatin and Taxol (03/05/2015 - 05/22/2015).  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  She had loose stools on oral magnesium.  She was initially on Neurontin then switched Lyrica with cycle #3.  Cycle #4 was notable for neutropenia (ANC 300) requiring GCSF x 3 days.    She underwent exploratory laparotomy, lysis of adhesions, total abdominal hysterectomy with bilateral salpingo-oophorectomy, infracolic omentectomy, optimal tumor debulking(< 1 cm), recto-sigmoid resection with creation of end colostomy, cholecystectomy, mobilization of splenic flexure and liver with diaphragmatic stripping on 06/15/2015. The right diaphragm was cleared of tumor. During dissection, the diaphragm was entered and closed with sutures.  She received tamoxifen from 02/25/2016 - 03/14/2016.  She received 4 cycles of carboplatin and gemcitabine (03/21/2016 - 05/24/2016) with GCSF/Neulasta support.  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  Cymbalta began on 02/24/2017  PDL-1 testing revealed a combined score was 5 (>=1 positive).     [employer] genetic testing negative for APC, ATM, BMPR1A, BRCA1, BRCA2, BRIP1, CDH1, CDK4, CDKN2A, CHEK2, EPCAM, MLH1, MSH2, MSH6, MUTYH, NBN, PALB2, PMS2, PTEN, RAD51C, RAD51D, SMAD4, STK11, TP53. Sequencing for select regions of POLE and POLD1, and large rearrangement analysis for select regions of GREM1 were negative.  She received 2 cycles of Doxil (06/21/2016 - 07/19/2016) with Neulasta support.  She received 4 cycles of topotecan with Neulasta support (08/22/2016 - 10/10/2016).    She received 3  cycles of pembrolizumab (Keytruda) from 12/06/2016 -  01/09/2017.  Cycle #1 was complicated by neutropenia (ANC 400) requiring GCSF/Granix x 3 days.  She received GCSF x 5 days with cycle #2.  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 Right lower chest pleural metastasis and several of the peritoneal tumor implants had increased.  There was new right inguinal lymphadenopathy.  Some of the peritoneal tumor implants had decreased.  The small dependent left pleural effusion was slightly increased .  The mild left hydroureteronephrosis was improved.  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   The chronic biliary ductal dilatation was stable .  There was no evidence of bowel obstruction.  CA125 was 802.9 on 03/30/2015, 567.9 on 04/13/2015, 168.8 on 05/15/2015, 85.2 on 07/17/2015, 68.6 on 07/28/2015, 34.5 on 09/17/2015, 20.7 on 11/06/2015, 49 on 01/22/2016, 106.1 on 02/22/2016, 138.8 on 03/07/2016, 220.8 on 03/21/2016, 152.8 on 04/11/2016, 125.6 on 04/18/2016, 76.8 on 05/03/2016, 60.2 on 05/24/2016, 44 on 07/19/2016, 65.8 on 08/17/2016, 53.4 on 09/12/2016, 47.3 on 10/10/2016, 91 on 12/20/2016, 141.1 on 01/09/2017, and 463.7 on 02/27/2017.  She has a history of recurrent right sided pleural effusion.  She underwent thoracentesis of 650 cc on post-operative day 3.  She was admitted to [facility] on 07/01/2015 and 07/07/2015 for recurrent shortness of breath.  She underwent 2 additional thoracenteses (1.1 L on 07/02/2015 and 850 cc on 07/08/2015).  Cytology was negative x 2.  Bilateral lower extremity duplex on 07/03/2015 was negative.  Echo revealed an EF of 55-60% on 07/08/2015 and 60-65% on 05/27/2016.    Rght upper extremity ultrasound on 08/01/2015 revealed a near occlusive thrombus within the central portion of the right internal jugular vein and central portion of  the right subclavian vein. She was on Lovenox 60 mg twice a day.  She switched to Eliquis on 04/16/2016 then returned  back to Lovenox after her CVA.  She has severe rheumatoid arthritis.  Methotrexate and Enbrel were initially on hold.  She has a normocytic anemia.  Work-up on 02/17/2015 and 05/24/2016 revealed a normal ferritin, B12, folate, TSH.  She denies any melena or hematochezia.    She has anemia due to chronic disease. She received 1 unit PRBCs during her admission at [facility]. She denies any melena or hematochezia. She has diabetes and is on an insulin pump.  She has had persistent neutropenia felt secondary to her rheumatoid arthritis.  Folate and MMA were normal.  TSH was 6.13 (high) with a free T4 of 1.17 (0.61-1.12).  She began methotrexate (10 mg a week) and prednisone (5 mg a day) for severe rheumatoid arthritis on 12/17/2015.  She remains on prednisone alone (5 mg a day).  Bone marrow aspirate and biopsy on 06/09/2010 revealed a hypercellular marrow (70%) with no evidence of dysplasia or malignancy.  Flow cytometry was negative.  Cytogenetics were normal (46,XX).  FISH studies were negative for MDS.  Bone marrow aspirate and biopsy on 12/03/2015 revealed a normocellular to mildy hypercellular marrow for age (40%) with left shifted myelopoiesis, non specific dyserythropoiesis and mild megakaryocytic atypia with no increase in blasts.  There were multiple small nonspecific lymphoid aggregates (favor reactive). There was no increase in reticulin.  There was decreased myeloid cells (37%) with left shifted maturation and 1% atypical myelod blasts.  There was relatively increased monocytic cells (11%), relatively increased lymphoid cells (36%), and relatively increased eosinophils (6%).  Cytogenetics were normal (17, XX).  SNP microarray was normal.  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  She receives IV magnesium weekly.  She has chemotherapy induced anemia.  She has received  Procrit (last 11/07/2016).  Labs on 08/22/2016 revealed a normal B12 and folate.  Ferritin was 37, iron saturation 11% and TIBC 281.  Code status is DNR/DNI.  She developed acute LFT elevation on 11/28/2016.  Event was preceded by upper abdominal discomfort.  Hepatitis B core antibody total and hepatitis C antibody were negative.  Symptomatically, she feels "ok".  Exam stable.  WBC is 2400 with an ANC of 1700. Hemoglobin is 10.3, hematocrit 31.0, and platelets 221,000.  Magnesium is 1.3 (low).  Plan: 1.  Discuss anemia evaluation:  Labs today: B12, folate, vitamin D, ferritin, iron studies, Mg. 2.  Discuss clinical trial options. Patient has not heard from [facility] or [facility]. Patient now verbalizing that she does not want to go because of the winter. States, "I do not want to get snowed in".  Dr. Mike Gip presented another clinical trial in [place] that would require patient to off of her Prednisone. She will to consider this as an option at this time. Patient states, "I am about ready just to forget the clinical trials and just take the Abraxane". 3.  Discuss treatment with Abraxane 6m/m2. Due to patient's blood counts, she would require GCSF injections 2-3 times each week. Discussed weekly treatments 3 weeks on/ 1 week off. Following the Abraxane, patient will need Neulasta support. Side effects reviewed. 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 Patient wishes to proceed with treatment. 4.  Discuss neuropathy. Patient started on Cymbalta, however patient appreciates little to no improvement. Patient has only been on the Cymbalta short term. Encouraged patient to continue therapy as prescribed. We will readdressed during the next RTC visit.  5.  Magnesium continues to be low at 1.3. Will replace with 4 gm  IV today. 6.  RTC in 1 week for MD assessment, labs (CBC with diff, CMP, Mg), and week 1 of cycle #1 Abraxane.    Honor Loh, NP  03/06/2017, 2:58  PM   I saw and evaluated the patient, participating in the key portions of the service and reviewing pertinent diagnostic studies and records.  I reviewed the nurse practitioner's note and agree with the findings and the plan.  The assessment and plan were discussed with the patient.  Multiple questions were asked by the patient and answered.   Nolon Stalls, MD 03/06/2017,2:58 PM

## 2017-03-07 LAB — VITAMIN D 25 HYDROXY (VIT D DEFICIENCY, FRACTURES): VIT D 25 HYDROXY: 53.6 ng/mL (ref 30.0–100.0)

## 2017-03-12 ENCOUNTER — Encounter: Payer: Self-pay | Admitting: Hematology and Oncology

## 2017-03-13 ENCOUNTER — Inpatient Hospital Stay: Payer: Medicare Other

## 2017-03-13 ENCOUNTER — Other Ambulatory Visit: Payer: Self-pay | Admitting: Hematology and Oncology

## 2017-03-13 ENCOUNTER — Inpatient Hospital Stay (HOSPITAL_BASED_OUTPATIENT_CLINIC_OR_DEPARTMENT_OTHER): Payer: Medicare Other | Admitting: Hematology and Oncology

## 2017-03-13 ENCOUNTER — Encounter: Payer: Self-pay | Admitting: Hematology and Oncology

## 2017-03-13 ENCOUNTER — Other Ambulatory Visit: Payer: Self-pay | Admitting: *Deleted

## 2017-03-13 VITALS — BP 131/76 | HR 89 | Temp 98.3°F | Resp 16 | Wt 138.0 lb

## 2017-03-13 DIAGNOSIS — Z79899 Other long term (current) drug therapy: Secondary | ICD-10-CM

## 2017-03-13 DIAGNOSIS — I252 Old myocardial infarction: Secondary | ICD-10-CM

## 2017-03-13 DIAGNOSIS — C786 Secondary malignant neoplasm of retroperitoneum and peritoneum: Secondary | ICD-10-CM

## 2017-03-13 DIAGNOSIS — M549 Dorsalgia, unspecified: Secondary | ICD-10-CM

## 2017-03-13 DIAGNOSIS — E538 Deficiency of other specified B group vitamins: Secondary | ICD-10-CM

## 2017-03-13 DIAGNOSIS — I82C11 Acute embolism and thrombosis of right internal jugular vein: Secondary | ICD-10-CM

## 2017-03-13 DIAGNOSIS — D638 Anemia in other chronic diseases classified elsewhere: Secondary | ICD-10-CM

## 2017-03-13 DIAGNOSIS — Z90710 Acquired absence of both cervix and uterus: Secondary | ICD-10-CM

## 2017-03-13 DIAGNOSIS — D72819 Decreased white blood cell count, unspecified: Secondary | ICD-10-CM

## 2017-03-13 DIAGNOSIS — I10 Essential (primary) hypertension: Secondary | ICD-10-CM | POA: Diagnosis not present

## 2017-03-13 DIAGNOSIS — N1330 Unspecified hydronephrosis: Secondary | ICD-10-CM

## 2017-03-13 DIAGNOSIS — R0602 Shortness of breath: Secondary | ICD-10-CM | POA: Diagnosis not present

## 2017-03-13 DIAGNOSIS — Z8742 Personal history of other diseases of the female genital tract: Secondary | ICD-10-CM

## 2017-03-13 DIAGNOSIS — R531 Weakness: Secondary | ICD-10-CM

## 2017-03-13 DIAGNOSIS — D709 Neutropenia, unspecified: Secondary | ICD-10-CM | POA: Diagnosis not present

## 2017-03-13 DIAGNOSIS — K5900 Constipation, unspecified: Secondary | ICD-10-CM

## 2017-03-13 DIAGNOSIS — R5383 Other fatigue: Secondary | ICD-10-CM

## 2017-03-13 DIAGNOSIS — Z801 Family history of malignant neoplasm of trachea, bronchus and lung: Secondary | ICD-10-CM

## 2017-03-13 DIAGNOSIS — C801 Malignant (primary) neoplasm, unspecified: Secondary | ICD-10-CM

## 2017-03-13 DIAGNOSIS — C569 Malignant neoplasm of unspecified ovary: Secondary | ICD-10-CM

## 2017-03-13 DIAGNOSIS — Z90722 Acquired absence of ovaries, bilateral: Secondary | ICD-10-CM

## 2017-03-13 DIAGNOSIS — D6481 Anemia due to antineoplastic chemotherapy: Secondary | ICD-10-CM

## 2017-03-13 DIAGNOSIS — I2510 Atherosclerotic heart disease of native coronary artery without angina pectoris: Secondary | ICD-10-CM

## 2017-03-13 DIAGNOSIS — Z9641 Presence of insulin pump (external) (internal): Secondary | ICD-10-CM

## 2017-03-13 DIAGNOSIS — C561 Malignant neoplasm of right ovary: Secondary | ICD-10-CM

## 2017-03-13 DIAGNOSIS — K219 Gastro-esophageal reflux disease without esophagitis: Secondary | ICD-10-CM

## 2017-03-13 DIAGNOSIS — D509 Iron deficiency anemia, unspecified: Secondary | ICD-10-CM

## 2017-03-13 DIAGNOSIS — Z87891 Personal history of nicotine dependence: Secondary | ICD-10-CM

## 2017-03-13 DIAGNOSIS — K449 Diaphragmatic hernia without obstruction or gangrene: Secondary | ICD-10-CM

## 2017-03-13 DIAGNOSIS — I509 Heart failure, unspecified: Secondary | ICD-10-CM

## 2017-03-13 DIAGNOSIS — Z7189 Other specified counseling: Secondary | ICD-10-CM

## 2017-03-13 DIAGNOSIS — C800 Disseminated malignant neoplasm, unspecified: Principal | ICD-10-CM

## 2017-03-13 DIAGNOSIS — Z8701 Personal history of pneumonia (recurrent): Secondary | ICD-10-CM

## 2017-03-13 DIAGNOSIS — G629 Polyneuropathy, unspecified: Secondary | ICD-10-CM

## 2017-03-13 DIAGNOSIS — E108 Type 1 diabetes mellitus with unspecified complications: Secondary | ICD-10-CM

## 2017-03-13 DIAGNOSIS — D61818 Other pancytopenia: Secondary | ICD-10-CM

## 2017-03-13 DIAGNOSIS — Z5111 Encounter for antineoplastic chemotherapy: Secondary | ICD-10-CM

## 2017-03-13 DIAGNOSIS — E876 Hypokalemia: Secondary | ICD-10-CM

## 2017-03-13 DIAGNOSIS — M069 Rheumatoid arthritis, unspecified: Secondary | ICD-10-CM

## 2017-03-13 LAB — CBC WITH DIFFERENTIAL/PLATELET
Basophils Absolute: 0 10*3/uL (ref 0–0.1)
Basophils Relative: 1 %
Eosinophils Absolute: 0 10*3/uL (ref 0–0.7)
Eosinophils Relative: 0 %
HCT: 30.2 % — ABNORMAL LOW (ref 35.0–47.0)
Hemoglobin: 10.2 g/dL — ABNORMAL LOW (ref 12.0–16.0)
Lymphocytes Relative: 20 %
Lymphs Abs: 0.5 10*3/uL — ABNORMAL LOW (ref 1.0–3.6)
MCH: 27.9 pg (ref 26.0–34.0)
MCHC: 33.7 g/dL (ref 32.0–36.0)
MCV: 82.6 fL (ref 80.0–100.0)
Monocytes Absolute: 0.4 10*3/uL (ref 0.2–0.9)
Monocytes Relative: 19 %
Neutro Abs: 1.4 10*3/uL (ref 1.4–6.5)
Neutrophils Relative %: 60 %
Platelets: 236 10*3/uL (ref 150–440)
RBC: 3.66 MIL/uL — ABNORMAL LOW (ref 3.80–5.20)
RDW: 16.1 % — ABNORMAL HIGH (ref 11.5–14.5)
WBC: 2.3 10*3/uL — ABNORMAL LOW (ref 3.6–11.0)

## 2017-03-13 LAB — COMPREHENSIVE METABOLIC PANEL
ALT: 12 U/L — ABNORMAL LOW (ref 14–54)
AST: 18 U/L (ref 15–41)
Albumin: 2.7 g/dL — ABNORMAL LOW (ref 3.5–5.0)
Alkaline Phosphatase: 89 U/L (ref 38–126)
Anion gap: 6 (ref 5–15)
BUN: 17 mg/dL (ref 6–20)
CO2: 29 mmol/L (ref 22–32)
Calcium: 9 mg/dL (ref 8.9–10.3)
Chloride: 95 mmol/L — ABNORMAL LOW (ref 101–111)
Creatinine, Ser: 0.72 mg/dL (ref 0.44–1.00)
GFR calc Af Amer: >60 mL/min (ref >60)
GFR calc non Af Amer: >60 mL/min (ref >60)
Glucose, Bld: 260 mg/dL — ABNORMAL HIGH (ref 65–99)
Potassium: 4.1 mmol/L (ref 3.5–5.1)
Sodium: 130 mmol/L — ABNORMAL LOW (ref 135–145)
Total Bilirubin: 0.4 mg/dL (ref 0.3–1.2)
Total Protein: 6.8 g/dL (ref 6.5–8.1)

## 2017-03-13 LAB — MAGNESIUM: Magnesium: 1.1 mg/dL — ABNORMAL LOW (ref 1.7–2.4)

## 2017-03-13 MED ORDER — SODIUM CHLORIDE 0.9 % IV SOLN
Freq: Once | INTRAVENOUS | Status: AC
  Administered 2017-03-13: 10:00:00 via INTRAVENOUS
  Filled 2017-03-13: qty 1000

## 2017-03-13 MED ORDER — ONDANSETRON HCL 4 MG PO TABS
8.0000 mg | ORAL_TABLET | Freq: Once | ORAL | Status: AC
  Administered 2017-03-13: 8 mg via ORAL
  Filled 2017-03-13: qty 2

## 2017-03-13 MED ORDER — SODIUM CHLORIDE 0.9 % IV SOLN
6.0000 g | Freq: Once | INTRAVENOUS | Status: AC
  Administered 2017-03-13: 6 g via INTRAVENOUS
  Filled 2017-03-13: qty 12

## 2017-03-13 MED ORDER — ENOXAPARIN SODIUM 60 MG/0.6ML ~~LOC~~ SOLN: SUBCUTANEOUS | 3 refills | Status: DC

## 2017-03-13 MED ORDER — HEPARIN SOD (PORK) LOCK FLUSH 100 UNIT/ML IV SOLN
500.0000 [IU] | Freq: Once | INTRAVENOUS | Status: AC
Start: 2017-03-13 — End: 2017-03-13
  Administered 2017-03-13: 500 [IU] via INTRAVENOUS
  Filled 2017-03-13: qty 5

## 2017-03-13 MED ORDER — PACLITAXEL PROTEIN-BOUND CHEMO INJECTION 100 MG
80.0000 mg/m2 | Freq: Once | Status: DC
  Filled 2017-03-13: qty 25

## 2017-03-13 MED ORDER — PACLITAXEL PROTEIN-BOUND CHEMO INJECTION 100 MG
80.0000 mg/m2 | Freq: Once | INTRAVENOUS | Status: AC
  Administered 2017-03-13: 125 mg via INTRAVENOUS
  Filled 2017-03-13: qty 25

## 2017-03-13 MED ORDER — SODIUM CHLORIDE 0.9% FLUSH
10.0000 mL | Freq: Once | INTRAVENOUS | Status: AC
Start: 2017-03-13 — End: 2017-03-13
  Administered 2017-03-13: 10 mL via INTRAVENOUS
  Filled 2017-03-13: qty 10

## 2017-03-13 NOTE — Progress Notes (Signed)
Patient here for follow up with labs and treatment today with Abraxane. She states that she is feeling well and denies having any pain. She is requesting a refill of her Lovenox today. She states that she received her Flu Vaccine on 10/15 at [facility].

## 2017-03-13 NOTE — Progress Notes (Signed)
[facility] day:  03/13/2017   Chief Complaint: [name] is a 71 y.o. female with stage IV ovarian cancer who is seen for assessment prior to week 1 of cycle #1 Abraxane.  HPI:  The patient was last seen in the medical oncology clinic on 03/06/2017.  At that time, she had not heard back from clinical trials at the [facility] or [facility].  She was not interested in a trial from [place] as she had to be off steroids. She decided to pursue Abraxane.  She felt "ok".  Exam was stable.  WBC was 2400 with an ANC of 1700. Hemoglobin was 10.3, hematocrit 31.0, and platelets 221,000.  Magnesium was 1.3 (low).  She received IV magnesium.  Anemia work-up revealed a ferritin of 155.  Iron saturation was 8% with a TIBC of 253.  B12 was 296.  Folate was normal.  During the interim, she feels weak and tired, "about the same". She denies B symptoms and interval infections. Patient has no issues with her bowels.  She denies hematochezia, melena, and vaginal bleeding. Patient continues to experience neuropathy in her hands, but notes "not that bad".  Patient is eating well. She has gained 2 pounds.     Past Medical History:  Diagnosis Date  . CAD (coronary artery disease)    a. 09/2012 Cath: LM nl, LAD 95p, 69m LCX 942mOM2 50, RCA 100.  . Marland KitchenHF (congestive heart failure) (HCHarding  . Cholelithiasis   . Collagen vascular disease (HCC)    Rheumatoid Arthritis.  . Esophageal stricture   . Exertional shortness of breath   . GERD (gastroesophageal reflux disease)   . H/O hiatal hernia   . Herniated disc   . History of pancytopenia   . Hyperlipidemia   . Hypertension   . Hypokalemia   . Leukopenia 2012   s/p bone marrow biopsy, Dr. PaMa Hillock. NSTEMI (non-ST elevated myocardial infarction) (HCP[place]5/2014   "mild" (10/18/2012)  . Ovarian cancer (HCH[place]2016   chemo and hysterectomy  . Pneumonia 2013; 08/2012   "one lung; double" (10/18/2012)  . Rheumatoid  arthritis(714.0)   . Type I diabetes mellitus ([facility]   "dx'd in 1957" (10/18/2012)    Past Surgical History:  Procedure Laterality Date  . ABDOMINAL HYSTERECTOMY  06/15/2015   Dx L/S, EXLAP TAH BSO omentectomy RSRx colostomy diaphragm resection stripping  . CARDIAC CATHETERIZATION  10/18/2012   "first one was today" (10/18/2012)  . CATARACT EXTRACTION W/ INTRAOCULAR LENS IMPLANT Right 2010  . CHOLECYSTECTOMY  06/15/2015   combined case with ovarian cancer debulking  . COLON SURGERY    . ESOPHAGEAL DILATION     "3 or 4 times" (10/19/2011)  . ESOPHAGOGASTRODUODENOSCOPY  2012   Dr. IfMinna Merritts. OSTOMY    . OVARY SURGERY     removal  . TUBAL LIGATION  1970    Family History  Problem Relation Age of Onset  . Diabetes Mother   . Arthritis Mother   . Diabetes Father   . Arthritis Father   . Aneurysm Sister        neck  . Cancer Sister        lung    Social History:  reports that she quit smoking about 22 years ago. Her smoking use included cigarettes. She has a 30.00 pack-year smoking history. she has never used smokeless tobacco. She reports that she does not drink alcohol or use drugs.  Patient is a retired Art therapist. Patient denies exposure to radiation and toxins. She is accompanied by her husband today.  Allergies:  Allergies  Allergen Reactions  . Codeine Nausea And Vomiting  . Latex Rash    Current Medications: Current Outpatient Medications  Medication Sig Dispense Refill  . Calcium-Vitamin D 600-200 MG-UNIT tablet Take 1 tablet by mouth 2 (two) times daily.     . cholecalciferol (VITAMIN D) 1000 units tablet Take 1,000 Units by mouth daily.    . DULoxetine (CYMBALTA) 20 MG capsule Take by mouth.    . enoxaparin (LOVENOX) 60 MG/0.6ML injection INJECT 0.6 ML (60 MG TOTAL) INTO THE SKIN EVERY 12 HOURS 60 mL 3  . folic acid (FOLVITE) 1 MG tablet Take 1 tablet (1 mg total) by mouth daily. 90 tablet 3  . furosemide (LASIX) 80 MG tablet Take 0.5 tablets by mouth 2  (two) times daily.    . insulin lispro (HUMALOG) 100 UNIT/ML injection Inject 0.06 mLs (6 Units total) into the skin daily. (Patient taking differently: Inject 0-85 Units into the skin daily. Via insulin pump) 30 mL 3  . lidocaine-prilocaine (EMLA) cream APPLY 1 APPLICATION TOPICALLY AS NEEDED 1 HOUR PRIOR TO TREATMENT. COVER WITH PRESS N SEAL UNTIL TREATMENT TIME AS DIRECTED 30 g 1  . loratadine (CLARITIN) 10 MG tablet Take 1 tablet by mouth daily as needed.    . magnesium oxide (MAG-OX) 400 (241.3 Mg) MG tablet TAKE 1 TABLET BY MOUTH ONCE DAILY 30 tablet 1  . metoprolol tartrate (LOPRESSOR) 25 MG tablet Take 1 tablet (25 mg total) by mouth 2 (two) times daily. 180 tablet 3  . pantoprazole (PROTONIX) 40 MG tablet TAKE 1 TABLET BY MOUTH TWICE (2) DAILY 180 tablet 1  . potassium chloride SA (K-DUR,KLOR-CON) 20 MEQ tablet TAKE 1 TABLET BY MOUTH ONCE A DAY 30 tablet 0  . predniSONE (DELTASONE) 5 MG tablet Take 5 mg by mouth daily with breakfast.    . vitamin C (ASCORBIC ACID) 500 MG tablet Take 500 mg by mouth daily.    Marland Kitchen zinc gluconate 50 MG tablet Take 50 mg daily by mouth.    . ondansetron (ZOFRAN) 8 MG tablet TAKE 1 TABLET BY MOUTH EVERY 8 HOURS AS NEEDED FOR NAUSEA OR VOMITING (Patient not taking: Reported on 02/22/2017) 30 tablet 1   Current Facility-Administered Medications  Medication Dose Route Frequency Provider Last Rate Last Dose  . Tbo-Filgrastim (GRANIX) injection 300 mcg  300 mcg Subcutaneous Once Lequita Asal, MD       Facility-Administered Medications Ordered in Other Visits  Medication Dose Route Frequency Provider Last Rate Last Dose  . heparin lock flush 100 unit/mL  500 Units Intravenous Once Corcoran, Melissa C, MD      . sodium chloride 0.9 % injection 10 mL  10 mL Intracatheter PRN Corcoran, Melissa C, MD      . sodium chloride 0.9 % injection 10 mL  10 mL Intravenous PRN Lequita Asal, MD   10 mL at 04/13/15 4098    Review of Systems:  GENERAL:  Feels  "about the same".  No fever, chills or sweats.  Weight up 2 pounds. Marland Kitchen PERFORMANCE STATUS (ECOG):  1 HEENT:  No visual changes, sore throat, mouth sores or tenderness. Lungs:  No shortness of breath.  No cough.  No hemoptysis. Cardiac:  No chest pain, palpitations, orthopnea, or PND. GI:  Appetite 50%.  Nausea.  No vomiting, diarrhea, melena or hematochezia. GU:  No urgency, frequency, dysuria,  or hematuria. Musculoskeletal:  Back pain.  Severe rheumatoid arthritis (on prednisone).  Osteoporosis.  No back pain. No muscle tenderness. Extremities:  No pain or swelling. Skin:  No increased bruising or bleeding.  No rashes or skin changes. Neuro:  Neuropathy (stable).  No headache, numbness or weakness, balance or coordination issues. Endocrine:  Diabetes on an insulin pump.  Blood sugar elevated on steroids.  Occasional hot flashes.  Psych:  No mood changes, depression or anxiety. Pain:  No pain. Review of systems:  All other systems reviewed and found to be negative.  Physical Exam:  Blood pressure 131/76, pulse 89, temperature 98.3 F (36.8 C), temperature source Tympanic, resp. rate 16, weight 138 lb (62.6 kg). GENERAL:  Well developed, well nourished woman sitting comfortably in the exam room in no acute distress. MENTAL STATUS:  Alert and oriented to person, place and time. HEAD:  Wearing a cap.  Alopecia.  Normocephalic, atraumatic, face symmetric, no Cushingoid features. EYES:  Gold rimmed glasses.  Blue eyes.  No conjunctivitis or scleral icterus.  ENT: Oropharynx clear without lesion. Tongue normal. Mucous membranes moist.  RESPIRATORY: Decreased breath sounds right base.  No wheezes or rhonchi. CARDIOVASCULAR: Regular rate and rhythm without murmur, rub or gallop. ABDOMEN: Soft, , non-tender with active bowel sounds and no splenomegaly. Slight fullness in RUQ.  No palpable nodularity or masses.  Insulin pump.  Ostomy bag. SKIN: No rashes or ulcers. EXTREMITIES:  No edema,  skin discoloration or tenderness. No palpable cords. LYMPH NODES: No palpable cervical, supraclavicular, axillary or inguinal adenopathy  NEUROLOGICAL: Appropriate. PSYCH: Appropriate.   Radiology studies: 02/13/2015:  Abdomen and pelvic CT revealed bilateral mass-like adnexal regions (right adnexal mass 5.6 x 5.0 cm and the left adnexa mass 10.3 x 6.0 x 9.9 cm).  There was a large amount of soft tissue throughout the peritoneal cavity involving the omentum and other peritoneal surfaces.  There was a small volume ascites. There was a peripheral 3.3 x 1.9 cm low-attenuation lesion overlying the right lobe of the liver, likely representing a serosal implant. There was 1.4 x 2.2 cm ill-defined peripheral lesion within the inferior aspect of segment 6 adjacent to the ampulla in the duodenum.  04/28/2015:  Abdominal and pelvic CT revealed decreasing bilateral ovarian masses.   The left adnexal mass measured 4.0 x 6.1 cm (previously 5.0 x 8.5 cm). The right ovary measured 3.8 x 4.9 cm (previously 4.7 x 5.6 cm).  There was improved peritoneal carcinomatosis.  There was a small amount of ascites.  The hepatic dome lesion was stable. The previously seen right hepatic lobe lesion was not well-visualized.  There was a small left pleural effusion and trace right pleural effusion.  The nodular lesion at the ampulla of Vater, extending into the duodenum was stable.  There was no biliary ductal dilatation. 08/01/2015:  Rght upper extremity ultrasound revealed a near occlusive thrombus within the central portion of the right internal jugular vein and central portion of the right subclavian vein. 09/01/2015:  Chest, abdomen, and pelvic CT revealed continued decrease in perihepatic fluid collection contiguous with the right pleural space with percutaneous drain.  09/11/2015:  Chest, abdomen, and pelvic CT revealed a residual versus recurrent fluid collection posterior to the right hepatic lobe (2.6 x 1.1 x 4.0 cm).    10/13/2015:  Chest, abdomen, and pelvic CT revealed resolution of the empyema. 02/15/2016:  Chest, abdomen, and pelvic CT revealed multiple soft tissue nodules throughout the pelvis, small bowel mesenteric and peritoneum and, concerning for  peritoneal metastatic disease.  There was soft tissue irregularity within the left upper quadrant.  There was mild right hydronephrosis (etiology unclear).  There was interval resolution of previously described right pleural based fluid and gas collection. There was a pleural based nodule within the right lower hemi-thorax concerning for pleural based metastasis.  There were small bilateral pleural effusions.  There was pulmonary nodularity, predominately within the left upper lobe (metastatic or infectious/inflammatory etiology).  There was slightly increased mediastinal adenopathy (infectious/inflammatory or metastatic).  There was a low attenuation lesion within the left hepatic lobe (complicated fluid within the fissure or metastatic disease). 03/08/2016:  Abdomen and pelvic CT revealed mild progression of peritoneal disease in the abdomen.  There was interval increase in loculated fluid around the lateral segment left liver, stomach, and spleen.  There was persistent soft tissue lesion at the level of the ampulla with mild intra and extrahepatic biliary duct dilatation.  There was persistent intrahepatic and capsular metastatic disease involving the liver. 05/26/2016:  Head MRI revealed multiple small areas of acute infarct involving the occipital parietal lobe bilaterally and the left lateral cerebellum,  consistent with posterior circulation emboli.  06/10/2016:  Adomen and pelvic CT revealed progression of peritoneal carcinomatosis predominantly in the perihepatic space, bilateral lower quadrants and pelvis.  There was worsening bilateral obstructive uropathy due to malignant involvement of the pelvic ureters.  There was stable small pleural/subpleural nodules at  the right lung base.  There was stable small left pleural effusion.  There was decreased small volume perihepatic ascites.  08/09/2016:  Abdomen and pelvic CT revealed interval increase and loculated fluid collections within the peritoneal space consistent with progression of peritoneal ovarian carcinoma metastasis.  There was one large 7 cm collection in the RIGHT lower quadrant which had increased significantly in size and may represent of point of small bowel obstruction.  The proximal stomach and duodenum were decompressed. The mid small bowel was mildly dilated. There was concern for obstruction given the large intraperitoneal fluid collection.  There was increase in subcapsular hepatic metastatic fluid collections.  There was enlargement of rounded ampullary lesion in the second portion duodenum concerning for metastatic lesion.  There was mild biliary duct dilatation (stable).  There was nodular pleural metastasis in the RIGHT lower lobe pleural space. 11/03/2016:  Abdomen and pelvic CT revealed multifocal cystic metastases in the abdomen/pelvis, reflecting peritoneal disease.  The dominant lesion in the left anterior abdomen has progressed, while additional lesions were mixed.  Peritoneal disease along the liver and spleen progressed.  Pleural-based metastases along the posterior right hemithorax progressed. There were trace left pleural effusion.  There was a stable 1.7 cm duodenal lesion at the ampulla. 11/29/2016:  Abdomen and pelvic CT revealed mild interval enlargement of pleural metastasis the RIGHT lower lobe.  There was overall mild decrease in cystic peritoneal metastasis within the abdomen pelvis. The solid lesion at the terminal ileum was decreased in size. Subcapsular lesion in the liver was slightly decreased in size.  There was no evidence of disease progression the abdomen pelvis.  There was mild intrahepatic duct dilatation similar prior.  The ampullary lesion was slightly larger.  There  was mild hydronephrosis and hydroureter on the LEFT likely related to cystic metastasis at the LEFT vesicoureteral junction 12/09/2016:  MRCP revealed the cystic lesion within the pancreatic head/uncinate process had decreased in size and demonstrated no suspicious characteristics. This can be presumed a pseudocyst or focus of side branch duct ectasia.  There was similar to slight  progression of extensive peritoneal metastasis since 11/29/2016. Grossly similar abdominal nodal and right pleural metastasis.  There was chronic mild common duct dilatation with similar soft tissue fullness about the ampulla compared to 11/29/2016.  There was improved to resolved left-sided hydronephrosis. 01/20/2017:  Abdomen and pelvic CT revealed complex mixed interval changes in widespread metastatic disease in the peritoneal cavity, lower chest and thoracic, abdominal and pelvic lymph nodes. Right lower chest pleural metastasis and several of the peritoneal tumor implants had increased.  There was new right inguinal lymphadenopathy.  Some of the peritoneal tumor implants had decreased.  The small dependent left pleural effusion was slightly increased .  The mild left hydroureteronephrosis was improved.  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   The chronic biliary ductal dilatation was stable .  There was no evidence of bowel obstruction.  Admissions: [place] from 06/15/2015 - 06/22/2015.  She underwent exploratory laparotomy, lysis of adhesions, total abdominal hysterectomy with bilateral salpingo-oophorectomy, infracolic omentectomy, optimal tumor debulking(< 1 cm), recto-sigmoid resection with creation of end colostomy, cholecystectomy, mobilization of splenic flexure and liver with diaphragmatic stripping on 06/15/2015. The right diaphragm was cleared of tumor. During dissection, the diaphragm was entered and closed with sutures.  [place] from 07/01/2015 - 07/03/2015 with pleural effusion. [place] from 07/07/2015 - 07/09/2015  with recurrent pleural effusion. [place] from 07/29/2015 - 08/10/2015 with a right-sided empyema and liver abscess. She underwent CT-guided placement of a liver abscess drain on 07/30/2015.  Liver abscess culture grew out group B strep and Enterobacter which was sensitive to Zosyn. She was transitioned to ertapenem Colbert Ewing) prior to discharge.  She was readmitted to [facility] on 09/17/2015. 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  Head MRI on 05/26/2016 revealed multiple small areas of acute infarct involving the occipital parietal lobe bilaterally and the left lateral cerebellum,  consistent with posterior circulation emboli.  Work-up included a negative carotid ultrasound and echocardiogram. [facility] from 08/10/2016 - 08/14/2016 with a small bowel obstruction.  She was managed conservatively. [place] from 09/27/2016 - 09/28/2016 with symptomatic anemia and diarrhea.   Infusion on 03/13/2017  Component Date Value Ref Range Status  . Magnesium 03/13/2017 1.1* 1.7 - 2.4 mg/dL Final  . Sodium 03/13/2017 130* 135 - 145 mmol/L Final  . Potassium 03/13/2017 4.1  3.5 - 5.1 mmol/L Final  . Chloride 03/13/2017 95* 101 - 111 mmol/L Final  . CO2 03/13/2017 29  22 - 32 mmol/L Final  . Glucose, Bld 03/13/2017 260* 65 - 99 mg/dL Final  . BUN 03/13/2017 17  6 - 20 mg/dL Final  . Creatinine, Ser 03/13/2017 0.72  0.44 - 1.00 mg/dL Final  . Calcium 03/13/2017 9.0  8.9 - 10.3 mg/dL Final  . Total Protein 03/13/2017 6.8  6.5 - 8.1 g/dL Final  . Albumin 03/13/2017 2.7* 3.5 - 5.0 g/dL Final  . AST 03/13/2017 18  15 - 41 U/L Final  . ALT 03/13/2017 12* 14 - 54 U/L Final  . Alkaline Phosphatase 03/13/2017 89  38 - 126 U/L Final  . Total Bilirubin 03/13/2017 0.4  0.3 - 1.2 mg/dL Final  . GFR calc non Af Amer 03/13/2017 >60  >60 mL/min Final  . GFR calc Af Amer 03/13/2017 >60  >60 mL/min Final   Comment: (NOTE) The eGFR has been calculated using the CKD EPI equation. This  calculation has not been validated in all clinical situations. eGFR's persistently <60 mL/min signify possible Chronic Kidney Disease.   Georgiann Hahn gap 03/13/2017  6  5 - 15 Final  . WBC 03/13/2017 2.3* 3.6 - 11.0 K/uL Final  . RBC 03/13/2017 3.66* 3.80 - 5.20 MIL/uL Final  . Hemoglobin 03/13/2017 10.2* 12.0 - 16.0 g/dL Final  . HCT 03/13/2017 30.2* 35.0 - 47.0 % Final  . MCV 03/13/2017 82.6  80.0 - 100.0 fL Final  . MCH 03/13/2017 27.9  26.0 - 34.0 pg Final  . MCHC 03/13/2017 33.7  32.0 - 36.0 g/dL Final  . RDW 03/13/2017 16.1* 11.5 - 14.5 % Final  . Platelets 03/13/2017 236  150 - 440 K/uL Final  . Neutrophils Relative % 03/13/2017 60  % Final  . Neutro Abs 03/13/2017 1.4  1.4 - 6.5 K/uL Final  . Lymphocytes Relative 03/13/2017 20  % Final  . Lymphs Abs 03/13/2017 0.5* 1.0 - 3.6 K/uL Final  . Monocytes Relative 03/13/2017 19  % Final  . Monocytes Absolute 03/13/2017 0.4  0.2 - 0.9 K/uL Final  . Eosinophils Relative 03/13/2017 0  % Final  . Eosinophils Absolute 03/13/2017 0.0  0 - 0.7 K/uL Final  . Basophils Relative 03/13/2017 1  % Final  . Basophils Absolute 03/13/2017 0.0  0 - 0.1 K/uL Final    Assessment:  IVORI STORR is a 71 y.o. female with progressive stage IV ovarian cancer.  She presented with abdominal discomfort and bloating.  Omental biopsy on 02/23/2015 revealed metastatic high grade serous carcinoma, consistent with gynecologic origin.   She was initially diagnosed with clinical stage IIIC (T3cN1Mx).  CA125 was 707 on 02/17/2015.  She received 4 cycles of neoadjuvant carboplatin and Taxol (03/05/2015 - 05/22/2015).  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  She had loose stools on oral magnesium.  She was initially on Neurontin then switched Lyrica with cycle #3.  Cycle #4 was notable for neutropenia (ANC 300) requiring GCSF x 3 days.    She underwent exploratory laparotomy, lysis of adhesions, total abdominal hysterectomy with bilateral salpingo-oophorectomy,  infracolic omentectomy, optimal tumor debulking(< 1 cm), recto-sigmoid resection with creation of end colostomy, cholecystectomy, mobilization of splenic flexure and liver with diaphragmatic stripping on 06/15/2015. The right diaphragm was cleared of tumor. During dissection, the diaphragm was entered and closed with sutures.  She received tamoxifen from 02/25/2016 - 03/14/2016.  She received 4 cycles of carboplatin and gemcitabine (03/21/2016 - 05/24/2016) with GCSF/Neulasta support.  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  Cymbalta began on 02/24/2017  PDL-1 testing revealed a combined score was 5 (>=1 positive).     [facility] genetic testing negative for APC, ATM, BMPR1A, BRCA1, BRCA2, BRIP1, CDH1, CDK4, CDKN2A, CHEK2, EPCAM, MLH1, MSH2, MSH6, MUTYH, NBN, PALB2, PMS2, PTEN, RAD51C, RAD51D, SMAD4, STK11, TP53. Sequencing for select regions of POLE and POLD1, and large rearrangement analysis for select regions of GREM1 were negative.  She received 2 cycles of Doxil (06/21/2016 - 07/19/2016) with Neulasta support.  She received 4 cycles of topotecan with Neulasta support (08/22/2016 - 10/10/2016).    She received 3 cycles of pembrolizumab (Keytruda) from 12/06/2016 -  01/09/2017.  Cycle #1 was complicated by neutropenia (ANC 400) requiring GCSF/Granix x 3 days.  She received GCSF x 5 days with cycle #2.  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 Right lower chest pleural metastasis and several of the peritoneal tumor implants had increased.  There was new right inguinal lymphadenopathy.  Some  of the peritoneal tumor implants had decreased.  The small dependent left pleural effusion was slightly increased .  The mild left hydroureteronephrosis was improved.  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   The chronic biliary ductal  dilatation was stable .  There was no evidence of bowel obstruction.  CA125 was 802.9 on 03/30/2015, 567.9 on 04/13/2015, 168.8 on 05/15/2015, 85.2 on 07/17/2015, 68.6 on 07/28/2015, 34.5 on 09/17/2015, 20.7 on 11/06/2015, 49 on 01/22/2016, 106.1 on 02/22/2016, 138.8 on 03/07/2016, 220.8 on 03/21/2016, 152.8 on 04/11/2016, 125.6 on 04/18/2016, 76.8 on 05/03/2016, 60.2 on 05/24/2016, 44 on 07/19/2016, 65.8 on 08/17/2016, 53.4 on 09/12/2016, 47.3 on 10/10/2016, 91 on 12/20/2016, 141.1 on 01/09/2017, and 463.7 on 02/27/2017.  She has a history of recurrent right sided pleural effusion.  She underwent thoracentesis of 650 cc on post-operative day 3.  She was admitted to [facility] on 07/01/2015 and 07/07/2015 for recurrent shortness of breath.  She underwent 2 additional thoracenteses (1.1 L on 07/02/2015 and 850 cc on 07/08/2015).  Cytology was negative x 2.  Bilateral lower extremity duplex on 07/03/2015 was negative.  Echo revealed an EF of 55-60% on 07/08/2015 and 60-65% on 05/27/2016.    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 She was on Lovenox 60 mg twice a day.  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  She has severe rheumatoid arthritis.  Methotrexate and Enbrel were initially on hold.  She has a normocytic anemia.  Work-up on 02/17/2015 and 05/24/2016 revealed a normal ferritin, B12, folate, TSH.  She denies any melena or hematochezia.    She has anemia due to chronic disease. She received 1 unit PRBCs during her admission at [facility]. She denies any melena or hematochezia. She has diabetes and is on an insulin pump.  She has had persistent neutropenia felt secondary to her rheumatoid arthritis.  Folate and MMA were normal.  TSH was 6.13 (high) with a free T4 of 1.17 (0.61-1.12).  She began methotrexate (10 mg a  week) and prednisone (5 mg a day) for severe rheumatoid arthritis on 12/17/2015.  She remains on prednisone alone (5 mg a day).  Bone marrow aspirate and biopsy on 06/09/2010 revealed a hypercellular marrow (70%) with no evidence of dysplasia or malignancy.  Flow cytometry was negative.  Cytogenetics were normal (46,XX).  FISH studies were negative for MDS.  Bone marrow aspirate and biopsy on 12/03/2015 revealed a normocellular to mildy hypercellular marrow for age (40%) with left shifted myelopoiesis, non specific dyserythropoiesis and mild megakaryocytic atypia with no increase in blasts.  There were multiple small nonspecific lymphoid aggregates (favor reactive). There was no increase in reticulin.  There was decreased myeloid cells (37%) with left shifted maturation and 1% atypical myelod blasts.  There was relatively increased monocytic cells (11%), relatively increased lymphoid cells (36%), and relatively increased eosinophils (6%).  Cytogenetics were normal (32, XX).  SNP microarray was normal.  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  She receives IV magnesium weekly.  She has chemotherapy induced anemia.  She has received Procrit (last 11/07/2016).  Labs on 08/22/2016 revealed a normal B12 and folate.  Ferritin was 37, iron saturation 11% and TIBC 281.  Code status is DNR/DNI.  She developed acute LFT elevation on 11/28/2016.  Event was preceded by upper abdominal discomfort.  Hepatitis B core antibody total and  hepatitis C antibody were negative.  Symptomatically, she feels "ok".  She has a grade I/II neuropathy.  Exam stable.  WBC is 2300 with an Harrah of 1400. Hemoglobin is 10.2, hematocrit 30.2, and platelets 236,000.  Magnesium is 1.1 (low).  Plan: 1.  Labs today:  CBC with diff, CMP, MMA, Mg. 2.  Discuss treatment with weekly Abraxane 74m/m2.  Advance dose as tolerated.  Side effects reviewed.  Discussed baseline counts and need for GCSF 2-3 days each week then OnPro  Neulasta prior to week off.  Review neutropenic precautions.  Patient verbalizes understanding and consent to initiation of treatment.  3.  Week 1 of cycle #1 Abraxane today. 4.  Discuss continued low magnesium levels. Level is 1.1 today. Will plan for 6 grams MG today.  5.  RTC on 11/13, 11/14, and 11/15 for GCSF 300 mcg SQ. 6.  RTC on 11/16  for labs (CBC with diff, BMP, Mg) and +/- GCSF and Mg. 7.  RTC on 11/19 for MD assessment , labs (CBC with diff, CMP, Mg), IV Mg, and week 2 of cycle # 1 Abraxane. 8.  RTC on 11/20, 11/21 for GCSF.  Investigate options for 3rd dose next week secondary to Thanksgiving holidays.    BHonor Loh NP  03/13/2017, 9:38 AM   I saw and evaluated the patient, participating in the key portions of the service and reviewing pertinent diagnostic studies and records.  I reviewed the nurse practitioner's note and agree with the findings and the plan.  The assessment and plan were discussed with the patient.  Multiple questions were asked by the patient and answered.   MNolon Stalls MD 03/13/2017,9:38 AM

## 2017-03-14 ENCOUNTER — Inpatient Hospital Stay: Payer: Medicare Other

## 2017-03-14 ENCOUNTER — Encounter: Payer: Self-pay | Admitting: Hematology and Oncology

## 2017-03-14 ENCOUNTER — Inpatient Hospital Stay (HOSPITAL_BASED_OUTPATIENT_CLINIC_OR_DEPARTMENT_OTHER): Payer: Medicare Other | Admitting: Oncology

## 2017-03-14 ENCOUNTER — Other Ambulatory Visit: Payer: Self-pay | Admitting: Hematology and Oncology

## 2017-03-14 VITALS — BP 112/69 | HR 108 | Temp 99.3°F

## 2017-03-14 DIAGNOSIS — D6481 Anemia due to antineoplastic chemotherapy: Secondary | ICD-10-CM

## 2017-03-14 DIAGNOSIS — K5900 Constipation, unspecified: Secondary | ICD-10-CM

## 2017-03-14 DIAGNOSIS — E709 Disorder of aromatic amino-acid metabolism, unspecified: Secondary | ICD-10-CM

## 2017-03-14 DIAGNOSIS — Z8701 Personal history of pneumonia (recurrent): Secondary | ICD-10-CM

## 2017-03-14 DIAGNOSIS — R0602 Shortness of breath: Secondary | ICD-10-CM

## 2017-03-14 DIAGNOSIS — C561 Malignant neoplasm of right ovary: Secondary | ICD-10-CM

## 2017-03-14 DIAGNOSIS — E876 Hypokalemia: Secondary | ICD-10-CM

## 2017-03-14 DIAGNOSIS — M069 Rheumatoid arthritis, unspecified: Secondary | ICD-10-CM

## 2017-03-14 DIAGNOSIS — Z79899 Other long term (current) drug therapy: Secondary | ICD-10-CM

## 2017-03-14 DIAGNOSIS — I2510 Atherosclerotic heart disease of native coronary artery without angina pectoris: Secondary | ICD-10-CM

## 2017-03-14 DIAGNOSIS — M549 Dorsalgia, unspecified: Secondary | ICD-10-CM

## 2017-03-14 DIAGNOSIS — E108 Type 1 diabetes mellitus with unspecified complications: Secondary | ICD-10-CM

## 2017-03-14 DIAGNOSIS — K449 Diaphragmatic hernia without obstruction or gangrene: Secondary | ICD-10-CM

## 2017-03-14 DIAGNOSIS — R5383 Other fatigue: Secondary | ICD-10-CM

## 2017-03-14 DIAGNOSIS — E860 Dehydration: Secondary | ICD-10-CM | POA: Diagnosis not present

## 2017-03-14 DIAGNOSIS — R531 Weakness: Secondary | ICD-10-CM | POA: Diagnosis not present

## 2017-03-14 DIAGNOSIS — Z90710 Acquired absence of both cervix and uterus: Secondary | ICD-10-CM

## 2017-03-14 DIAGNOSIS — K219 Gastro-esophageal reflux disease without esophagitis: Secondary | ICD-10-CM

## 2017-03-14 DIAGNOSIS — Z801 Family history of malignant neoplasm of trachea, bronchus and lung: Secondary | ICD-10-CM

## 2017-03-14 DIAGNOSIS — R630 Anorexia: Secondary | ICD-10-CM | POA: Diagnosis not present

## 2017-03-14 DIAGNOSIS — R112 Nausea with vomiting, unspecified: Secondary | ICD-10-CM

## 2017-03-14 DIAGNOSIS — C786 Secondary malignant neoplasm of retroperitoneum and peritoneum: Secondary | ICD-10-CM

## 2017-03-14 DIAGNOSIS — D638 Anemia in other chronic diseases classified elsewhere: Secondary | ICD-10-CM

## 2017-03-14 DIAGNOSIS — Z87891 Personal history of nicotine dependence: Secondary | ICD-10-CM

## 2017-03-14 DIAGNOSIS — R110 Nausea: Secondary | ICD-10-CM

## 2017-03-14 DIAGNOSIS — I509 Heart failure, unspecified: Secondary | ICD-10-CM

## 2017-03-14 DIAGNOSIS — Z87442 Personal history of urinary calculi: Secondary | ICD-10-CM

## 2017-03-14 DIAGNOSIS — Z9641 Presence of insulin pump (external) (internal): Secondary | ICD-10-CM

## 2017-03-14 DIAGNOSIS — Z90722 Acquired absence of ovaries, bilateral: Secondary | ICD-10-CM

## 2017-03-14 DIAGNOSIS — N1330 Unspecified hydronephrosis: Secondary | ICD-10-CM

## 2017-03-14 DIAGNOSIS — I10 Essential (primary) hypertension: Secondary | ICD-10-CM

## 2017-03-14 DIAGNOSIS — I252 Old myocardial infarction: Secondary | ICD-10-CM

## 2017-03-14 DIAGNOSIS — D72819 Decreased white blood cell count, unspecified: Secondary | ICD-10-CM

## 2017-03-14 LAB — COMPREHENSIVE METABOLIC PANEL
ALBUMIN: 2.6 g/dL — AB (ref 3.5–5.0)
ALK PHOS: 86 U/L (ref 38–126)
ALT: 13 U/L — AB (ref 14–54)
AST: 21 U/L (ref 15–41)
Anion gap: 12 (ref 5–15)
BILIRUBIN TOTAL: 0.8 mg/dL (ref 0.3–1.2)
BUN: 21 mg/dL — ABNORMAL HIGH (ref 6–20)
CALCIUM: 8.5 mg/dL — AB (ref 8.9–10.3)
CO2: 23 mmol/L (ref 22–32)
CREATININE: 1.1 mg/dL — AB (ref 0.44–1.00)
Chloride: 95 mmol/L — ABNORMAL LOW (ref 101–111)
GFR calc Af Amer: 56 mL/min — ABNORMAL LOW (ref >60)
GFR calc non Af Amer: 49 mL/min — ABNORMAL LOW (ref >60)
GLUCOSE: 233 mg/dL — AB (ref 65–99)
Potassium: 4.6 mmol/L (ref 3.5–5.1)
SODIUM: 130 mmol/L — AB (ref 135–145)
TOTAL PROTEIN: 6.7 g/dL (ref 6.5–8.1)

## 2017-03-14 LAB — CBC WITH DIFFERENTIAL/PLATELET
BASOS ABS: 0 10*3/uL (ref 0–0.1)
BASOS PCT: 0 %
EOS ABS: 0 10*3/uL (ref 0–0.7)
EOS PCT: 0 %
HCT: 31 % — ABNORMAL LOW (ref 35.0–47.0)
Hemoglobin: 10.4 g/dL — ABNORMAL LOW (ref 12.0–16.0)
Lymphocytes Relative: 9 %
Lymphs Abs: 0.3 10*3/uL — ABNORMAL LOW (ref 1.0–3.6)
MCH: 27.8 pg (ref 26.0–34.0)
MCHC: 33.5 g/dL (ref 32.0–36.0)
MCV: 83 fL (ref 80.0–100.0)
MONO ABS: 0.3 10*3/uL (ref 0.2–0.9)
MONOS PCT: 7 %
Neutro Abs: 3.1 10*3/uL (ref 1.4–6.5)
Neutrophils Relative %: 84 %
PLATELETS: 224 10*3/uL (ref 150–440)
RBC: 3.74 MIL/uL — ABNORMAL LOW (ref 3.80–5.20)
RDW: 17 % — AB (ref 11.5–14.5)
WBC: 3.7 10*3/uL (ref 3.6–11.0)

## 2017-03-14 LAB — LACTIC ACID, PLASMA: Lactic Acid, Venous: 1.5 mmol/L (ref 0.5–1.9)

## 2017-03-14 LAB — PROCALCITONIN: Procalcitonin: 1.18 ng/mL

## 2017-03-14 MED ORDER — ONDANSETRON HCL 40 MG/20ML IJ SOLN: Freq: Once | INTRAMUSCULAR | Status: DC

## 2017-03-14 MED ORDER — ONDANSETRON HCL 4 MG/2ML IJ SOLN
8.0000 mg | Freq: Once | INTRAMUSCULAR | Status: AC
  Administered 2017-03-14: 8 mg via INTRAVENOUS
  Filled 2017-03-14: qty 4

## 2017-03-14 MED ORDER — SODIUM CHLORIDE 0.9% FLUSH
10.0000 mL | INTRAVENOUS | Status: DC | PRN
  Filled 2017-03-14: qty 10

## 2017-03-14 MED ORDER — TBO-FILGRASTIM 300 MCG/0.5ML ~~LOC~~ SOSY
300.0000 ug | PREFILLED_SYRINGE | Freq: Once | SUBCUTANEOUS | Status: AC
  Administered 2017-03-14: 300 ug via SUBCUTANEOUS
  Filled 2017-03-14: qty 0.5

## 2017-03-14 MED ORDER — SODIUM CHLORIDE 0.9 % IV SOLN: Freq: Once | INTRAVENOUS | Status: DC

## 2017-03-14 MED ORDER — SODIUM CHLORIDE 0.9 % IV SOLN
INTRAVENOUS | Status: DC
  Administered 2017-03-14: 16:00:00 via INTRAVENOUS
  Filled 2017-03-14 (×2): qty 1000

## 2017-03-14 MED ORDER — POTASSIUM CHLORIDE 20 MEQ/100ML IV SOLN: 20.0000 meq | Freq: Once | INTRAVENOUS | Status: DC

## 2017-03-14 MED ORDER — HEPARIN SOD (PORK) LOCK FLUSH 100 UNIT/ML IV SOLN
500.0000 [IU] | INTRAVENOUS | Status: AC | PRN
  Administered 2017-03-14: 500 [IU]
  Filled 2017-03-14: qty 5

## 2017-03-14 NOTE — Progress Notes (Signed)
Symptom Management Consult note [place]  Telephone:([phone]) [phone] Fax:([phone]) [phone]  Patient Care Team: Leone Haven, MD as PCP - General (Family Medicine) Lequita Asal, MD as PCP - Hematology/Oncology (Hematology and Oncology) Clent Jacks, RN as Registered Nurse Jefm Bryant Susann Givens., MD (Rheumatology) Gabriel Carina Betsey Holiday, MD as Physician Assistant (Internal Medicine) Gillis Ends, MD as Referring Physician (Obstetrics and Gynecology) Mellody Drown, MD as Referring Physician (Obstetrics and Gynecology) Minna Merritts, MD as Consulting Physician (Cardiology) Jonathon Bellows, MD as Consulting Physician (Surgery)   Name of the patient: [name]  [id number]  1945/06/21   Date of visit: 03/14/17  Diagnosis- stage IV ovarian cancer   Chief complaint/ Reason for visit- Weakness/Nausea/Anorexia  Heme/Onc history: [name] is a 71 y.o. female with progressive stage IV ovarian cancer.  She presented with abdominal discomfort and bloating.  Omental biopsy on 02/23/2015 revealed metastatic high grade serous carcinoma, consistent with gynecologic origin.   She was initially diagnosed with clinical stage IIIC (T3cN1Mx).  CA125 was 707 on 02/17/2015.  She received 4 cycles of neoadjuvant carboplatin and Taxol (03/05/2015 - 05/22/2015).  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  She had loose stools on oral magnesium.  She was initially on Neurontin then switched Lyrica with cycle #3.  Cycle #4 was notable for neutropenia (ANC 300) requiring GCSF x 3 days.    She underwent exploratory laparotomy, lysis of adhesions, total abdominal hysterectomy with bilateral salpingo-oophorectomy, infracolic omentectomy, optimal tumor debulking(< 1 cm), recto-sigmoid resection with creation of end colostomy, cholecystectomy, mobilization of splenic flexure and liver with diaphragmatic stripping on 06/15/2015. The right diaphragm was  cleared of tumor. During dissection, the diaphragm was entered and closed with sutures.  She received tamoxifen from 02/25/2016 - 03/14/2016.  She received 4 cycles of carboplatin and gemcitabine (03/21/2016 - 05/24/2016) with GCSF/Neulasta support.  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  Cymbalta began on 02/24/2017  PDL-1 testing revealed a combined score was 5 (>=1 positive).     [facility] genetic testing negative for APC, ATM, BMPR1A, BRCA1, BRCA2, BRIP1, CDH1, CDK4, CDKN2A, CHEK2, EPCAM, MLH1, MSH2, MSH6, MUTYH, NBN, PALB2, PMS2, PTEN, RAD51C, RAD51D, SMAD4, STK11, TP53. Sequencing for select regions of POLE and POLD1, and large rearrangement analysis for select regions of GREM1 were negative.  She received 2 cycles of Doxil (06/21/2016 - 07/19/2016) with Neulasta support.  She received 4 cycles of topotecan with Neulasta support (08/22/2016 - 10/10/2016).    She received 3 cycles of pembrolizumab (Keytruda) from 12/06/2016 -  01/09/2017.  Cycle #1 was complicated by neutropenia (ANC 400) requiring GCSF/Granix x 3 days.  She received GCSF x 5 days with cycle #2.  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 Right lower chest pleural metastasis and several of the peritoneal tumor implants had increased.  There was new right inguinal lymphadenopathy.  Some of the peritoneal tumor implants had decreased.  The small dependent left pleural effusion was slightly increased .  The mild left hydroureteronephrosis was improved.  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   The chronic biliary ductal dilatation was stable .  There was no evidence of bowel obstruction.  CA125 was 802.9 on 03/30/2015, 567.9 on 04/13/2015, 168.8 on 05/15/2015, 85.2 on 07/17/2015, 68.6 on 07/28/2015, 34.5 on 09/17/2015, 20.7 on 11/06/2015, 49 on  01/22/2016,  106.1 on 02/22/2016, 138.8 on 03/07/2016, 220.8 on 03/21/2016, 152.8 on 04/11/2016, 125.6 on 04/18/2016, 76.8 on 05/03/2016, 60.2 on 05/24/2016, 44 on 07/19/2016, 65.8 on 08/17/2016, 53.4 on 09/12/2016, 47.3 on 10/10/2016, 91 on 12/20/2016, 141.1 on 01/09/2017, and 463.7 on 02/27/2017.  She has a history of recurrent right sided pleural effusion.  She underwent thoracentesis of 650 cc on post-operative day 3.  She was admitted to [facility] on 07/01/2015 and 07/07/2015 for recurrent shortness of breath.  She underwent 2 additional thoracenteses (1.1 L on 07/02/2015 and 850 cc on 07/08/2015).  Cytology was negative x 2.  Bilateral lower extremity duplex on 07/03/2015 was negative.  Echo revealed an EF of 55-60% on 07/08/2015 and 60-65% on 05/27/2016.    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 She was on Lovenox 60 mg twice a day.  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  She has severe rheumatoid arthritis.  Methotrexate and Enbrel were initially on hold.  She has a normocytic anemia.  Work-up on 02/17/2015 and 05/24/2016 revealed a normal ferritin, B12, folate, TSH.  She denies any melena or hematochezia.    She has anemia due to chronic disease. She received 1 unit PRBCs during her admission at [facility]. She denies any melena or hematochezia. She has diabetes and is on an insulin pump.  She has had persistent neutropenia felt secondary to her rheumatoid arthritis.  Folate and MMA were normal.  TSH was 6.13 (high) with a free T4 of 1.17 (0.61-1.12).  She began methotrexate (10 mg a week) and prednisone (5 mg a day) for severe rheumatoid arthritis on 12/17/2015.  She remains on prednisone alone (5 mg a day).  Bone marrow aspirate and biopsy on 06/09/2010 revealed a hypercellular marrow (70%) with no  evidence of dysplasia or malignancy.  Flow cytometry was negative.  Cytogenetics were normal (46,XX).  FISH studies were negative for MDS.  Bone marrow aspirate and biopsy on 12/03/2015 revealed a normocellular to mildy hypercellular marrow for age (40%) with left shifted myelopoiesis, non specific dyserythropoiesis and mild megakaryocytic atypia with no increase in blasts.  There were multiple small nonspecific lymphoid aggregates (favor reactive). There was no increase in reticulin.  There was decreased myeloid cells (37%) with left shifted maturation and 1% atypical myelod blasts.  There was relatively increased monocytic cells (11%), relatively increased lymphoid cells (36%), and relatively increased eosinophils (6%).  Cytogenetics were normal (90, XX).  SNP microarray was normal.  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  She receives IV magnesium weekly.  She has chemotherapy induced anemia.  She has received Procrit (last 11/07/2016).  Labs on 08/22/2016 revealed a normal B12 and folate.  Ferritin was 37, iron saturation 11% and TIBC 281.  Code status is DNR/DNI.  She developed acute LFT elevation on 11/28/2016.  Event was preceded by upper abdominal discomfort.  Hepatitis B core antibody total and hepatitis C antibody were negative.  Interval history- Patient presents today for weakness, nausea and anorexia.   Weakness/fatigue: Patient complains of weakness and fatigue that began after treatment yesterday. Weakness is in all extremities. Needing assistance with ADL's. Using cane to walk. Aggravated with exertion and relived with rest.   Nausea: Patient complains of nausea. Onset of nausea was yesterday. Nausea is occurring intermittently throughout the day. Nausea has been associated with recent chemotherapy. One episode  of vomiting. Has no previous visits for nausea. No evaluation to date. Treatment to date: Zofran with relief.   Anorexia: Patient complains of anorexia.  Onset occurred after chemotherapy. He has no previous history for visits for anorexia. Does not have a taste for anything. Had Esnure this morning. Trying to force fluids.  ECOG FS:1 - Symptomatic but completely ambulatory  Review of systems- Review of Systems  Constitutional: Positive for malaise/fatigue. Negative for chills and fever.  HENT: Negative.   Eyes: Negative.   Respiratory: Negative.  Negative for cough, shortness of breath and wheezing.   Cardiovascular: Negative.  Negative for chest pain.  Gastrointestinal: Positive for nausea.  Genitourinary: Negative.   Skin: Negative.   Neurological: Positive for weakness. Negative for dizziness and headaches.  Endo/Heme/Allergies: Negative.   Psychiatric/Behavioral: Negative.      Current treatment- Week 1 cycle 1 Abraxane  Allergies  Allergen Reactions  . Codeine Nausea And Vomiting  . Latex Rash     Past Medical History:  Diagnosis Date  . CAD (coronary artery disease)    a. 09/2012 Cath: LM nl, LAD 95p, 54m LCX 968mOM2 50, RCA 100.  . Marland KitchenHF (congestive heart failure) ([facility]  . Cholelithiasis   . Collagen vascular disease (HCC)    Rheumatoid Arthritis.  . Esophageal stricture   . Exertional shortness of breath   . GERD (gastroesophageal reflux disease)   . H/O hiatal hernia   . Herniated disc   . History of pancytopenia   . Hyperlipidemia   . Hypertension   . Hypokalemia   . Leukopenia 2012   s/p bone marrow biopsy, Dr. PaMa Hillock. NSTEMI (non-ST elevated myocardial infarction) ([place]   "mild" (10/18/2012)  . Ovarian cancer ([place]   chemo and hysterectomy  . Pneumonia 2013; 08/2012   "one lung; double" (10/18/2012)  . Rheumatoid arthritis(714.0)   . Type I diabetes mellitus ([place]   "dx'd in 1957" (10/18/2012)     Past Surgical History:  Procedure Laterality Date  . ABDOMINAL HYSTERECTOMY  06/15/2015   Dx L/S, EXLAP TAH BSO omentectomy RSRx colostomy diaphragm resection stripping  . CARDIAC  CATHETERIZATION  10/18/2012   "first one was today" (10/18/2012)  . CATARACT EXTRACTION W/ INTRAOCULAR LENS IMPLANT Right 2010  . CHOLECYSTECTOMY  06/15/2015   combined case with ovarian cancer debulking  . COLON SURGERY    . CORONARY ARTERY BYPASS GRAFT N/A 10/19/2012   Procedure: CORONARY ARTERY BYPASS GRAFTING (CABG);  Surgeon: StMelrose NakayamaMD;  Location: [facility] Service: Open Heart Surgery;  Laterality: N/A;  . ESOPHAGEAL DILATION     "3 or 4 times" (10/19/2011)  . ESOPHAGOGASTRODUODENOSCOPY  2012   Dr. IfMinna Merritts. OSTOMY    . OVARY SURGERY     removal  . PERIPHERAL VASCULAR CATHETERIZATION N/A 03/02/2015   Procedure: PoGlori Luisath Insertion;  Surgeon: JaAlgernon HuxleyMD;  [facility];  Service: Cardiovascular;  Laterality: N/A;  . TUBAL LIGATION  1970    Social History   Socioeconomic History  . Marital status: Married    Spouse name: Not on file  . Number of children: 3  . Years of education: Not on file  . Highest education level: Not on file  Social Needs  . Financial resource strain: Not on file  . Food insecurity - worry: Not on file  . Food insecurity - inability: Not on file  . Transportation needs - medical: Not on file  . Transportation  needs - non-medical: Not on file  Occupational History  . Occupation: Retired    Fish farm manager: retired  Tobacco Use  . Smoking status: Former Smoker    Packs/day: 1.00    Years: 30.00    Pack years: 30.00    Types: Cigarettes    Last attempt to quit: 06/11/1994    Years since quitting: 22.7  . Smokeless tobacco: Never Used  Substance and Sexual Activity  . Alcohol use: No  . Drug use: No  . Sexual activity: No  Other Topics Concern  . Not on file  Social History Narrative  . Not on file    Family History  Problem Relation Age of Onset  . Diabetes Mother   . Arthritis Mother   . Diabetes Father   . Arthritis Father   . Aneurysm Sister        neck  . Cancer Sister        lung     Current  Outpatient Medications:  .  Calcium-Vitamin D 600-200 MG-UNIT tablet, Take 1 tablet by mouth 2 (two) times daily. , Disp: , Rfl:  .  cholecalciferol (VITAMIN D) 1000 units tablet, Take 1,000 Units by mouth daily., Disp: , Rfl:  .  DULoxetine (CYMBALTA) 20 MG capsule, Take by mouth., Disp: , Rfl:  .  enoxaparin (LOVENOX) 40 MG/0.4ML injection, Inject 0.6 mLs (60 mg total) every 12 (twelve) hours into the skin., Disp: 60 Syringe, Rfl: 2 .  enoxaparin (LOVENOX) 60 MG/0.6ML injection, INJECT 0.6 ML (60 MG TOTAL) INTO THE SKIN EVERY 12 HOURS, Disp: 60 mL, Rfl: 3 .  folic acid (FOLVITE) 1 MG tablet, Take 1 tablet (1 mg total) by mouth daily., Disp: 90 tablet, Rfl: 3 .  furosemide (LASIX) 80 MG tablet, Take 0.5 tablets by mouth 2 (two) times daily., Disp: , Rfl:  .  insulin lispro (HUMALOG) 100 UNIT/ML injection, Inject 0.06 mLs (6 Units total) into the skin daily. (Patient taking differently: Inject 0-85 Units into the skin daily. Via insulin pump), Disp: 30 mL, Rfl: 3 .  lidocaine-prilocaine (EMLA) cream, APPLY 1 APPLICATION TOPICALLY AS NEEDED 1 HOUR PRIOR TO TREATMENT. COVER WITH PRESS N SEAL UNTIL TREATMENT TIME AS DIRECTED, Disp: 30 g, Rfl: 1 .  loratadine (CLARITIN) 10 MG tablet, Take 1 tablet by mouth daily as needed., Disp: , Rfl:  .  magnesium oxide (MAG-OX) 400 (241.3 Mg) MG tablet, TAKE 1 TABLET BY MOUTH ONCE DAILY, Disp: 30 tablet, Rfl: 1 .  metoprolol tartrate (LOPRESSOR) 25 MG tablet, Take 1 tablet (25 mg total) by mouth 2 (two) times daily., Disp: 180 tablet, Rfl: 3 .  ondansetron (ZOFRAN) 8 MG tablet, TAKE 1 TABLET BY MOUTH EVERY 8 HOURS AS NEEDED FOR NAUSEA OR VOMITING (Patient not taking: Reported on 02/22/2017), Disp: 30 tablet, Rfl: 1 .  pantoprazole (PROTONIX) 40 MG tablet, TAKE 1 TABLET BY MOUTH TWICE (2) DAILY, Disp: 180 tablet, Rfl: 1 .  potassium chloride SA (K-DUR,KLOR-CON) 20 MEQ tablet, TAKE 1 TABLET BY MOUTH ONCE A DAY, Disp: 30 tablet, Rfl: 0 .  predniSONE (DELTASONE) 5 MG  tablet, Take 5 mg by mouth daily with breakfast., Disp: , Rfl:  .  vitamin C (ASCORBIC ACID) 500 MG tablet, Take 500 mg by mouth daily., Disp: , Rfl:  .  zinc gluconate 50 MG tablet, Take 50 mg daily by mouth., Disp: , Rfl:   Current Facility-Administered Medications:  .  Tbo-Filgrastim (GRANIX) injection 300 mcg, 300 mcg, Subcutaneous, Once, Corcoran, Drue Second, MD  Facility-Administered  Medications Ordered in Other Visits:  .  sodium chloride 0.9 % injection 10 mL, 10 mL, Intracatheter, PRN, Corcoran, Melissa C, MD .  sodium chloride 0.9 % injection 10 mL, 10 mL, Intravenous, PRN, Mike Gip, Melissa C, MD, 10 mL at 04/13/15 5885  Physical exam:  Vitals:   03/14/17 1521  BP: 112/69  Pulse: (!) 108  Temp: 99.3 F (37.4 C)   Physical Exam  Constitutional: She is oriented to person, place, and time and well-developed, well-nourished, and in no distress. She appears dehydrated.  HENT:  Head: Normocephalic and atraumatic.  Mouth/Throat: Mucous membranes are dry.  Eyes: Pupils are equal, round, and reactive to light.  Neck: Normal range of motion. Neck supple.  Cardiovascular: Regular rhythm and normal heart sounds. Tachycardia present.  Pulmonary/Chest: Effort normal. She has decreased breath sounds in the right upper field, the left upper field and the left lower field. She has rales in the right lower field.  Abdominal: Soft. Bowel sounds are normal.  Musculoskeletal: Normal range of motion.  Neurological: She is alert and oriented to person, place, and time. Gait normal.  Skin: Skin is warm and dry.     CMP Latest Ref Rng & Units 03/14/2017  Glucose 65 - 99 mg/dL 233(H)  BUN 6 - 20 mg/dL 21(H)  Creatinine 0.44 - 1.00 mg/dL 1.10(H)  Sodium 135 - 145 mmol/L 130(L)  Potassium 3.5 - 5.1 mmol/L 4.6  Chloride 101 - 111 mmol/L 95(L)  CO2 22 - 32 mmol/L 23  Calcium 8.9 - 10.3 mg/dL 8.5(L)  Total Protein 6.5 - 8.1 g/dL 6.7  Total Bilirubin 0.3 - 1.2 mg/dL 0.8  Alkaline Phos 38 - 126  U/L 86  AST 15 - 41 U/L 21  ALT 14 - 54 U/L 13(L)   CBC Latest Ref Rng & Units 03/14/2017  WBC 3.6 - 11.0 K/uL 3.7  Hemoglobin 12.0 - 16.0 g/dL 10.4(L)  Hematocrit 35.0 - 47.0 % 31.0(L)  Platelets 150 - 440 K/uL 224    No images are attached to the encounter.  Dg Chest 2 View  Result Date: 02/27/2017 CLINICAL DATA:  Shortness of breath, right posterior chest pain and history of metastatic ovarian carcinoma EXAM: CHEST  2 VIEW COMPARISON:  CT of the abdomen and pelvis on 01/20/2017 and CT of the chest on 02/15/2016 FINDINGS: Stable appearance of Port-A-Cath. Normal heart size status post CABG. 2.3 cm lung nodule in the right mid lung. Bibasilar atelectasis present with potential tiny bilateral pleural effusions. No airspace consolidation or edema. No pneumothorax. Bony structures appear unremarkable with stable mild loss of height of the T12 vertebral body. IMPRESSION: Bibasilar atelectasis with probable small bilateral pleural effusions. 2.3 cm right lung nodule. Electronically Signed   By: Aletta Edouard M.D.   On: 02/27/2017 11:13   Dg Chest Right Decubitus  Result Date: 02/27/2017 CLINICAL DATA:  Metastatic ovarian carcinoma and possible pleural effusions. EXAM: CHEST - RIGHT DECUBITUS COMPARISON:  Two-view chest x-ray earlier today. FINDINGS: Decubitus view with the right side down shows a trace amount of layering right pleural fluid. Left pleural effusion not able to be assessed. IMPRESSION: Trace layering right pleural effusion. Electronically Signed   By: Aletta Edouard M.D.   On: 02/27/2017 11:15     Assessment and plan- Patient is a 71 y.o. female with weakness, anorexia and nausea. Initial assessment patient was tachycardic (108) with low grade temp 99.3. Labs indicate hyponatremia (130), anemia (10.4) and elevated kidney function (21, 1.10). RLL coarse crackles auscultated. All other  lobes diminished. Mucous membranes dry. Patient appears weak and pale.  Will give 1 L normal  saline and 8 mg IV Zofran in Symptom Managment. Will check labs. CBC, CMET, Lactic acid and Pro calcitonin. Will also give Granix injection. Will reevaluate patient after fluids are given.    Post fluids, patient felt significantly better. Patients color improved.  All vitals normalized. HR 88, BP 129/67 and temp 98.5. Labs indicate hyponatremia and slightly elevated kidney function but normal pro calcitonin and lactic acid levels. Patient is stable to go home.   Patient is scheduled to return for the next 2 days for Granix injections and then on Friday for labs/ MD assessment and possible treatment. Patient was instructed to continue antiemetics at home and encouraged to eat small frequent meals. Continue supplementation as needed with Ensures. Patient will continue her 5 mg prednisone twice a day for RA. No new prescriptions at this time. Patient instructed to let us know if she continues to have fevers and is unable to adequately consume fluids. Neutropenic precautions were reviewed.    Visit Diagnosis 1. Dehydration   2. Weakness   3. Anorexia   4. Nausea without vomiting     Patient expressed understanding and was in agreement with this plan. She also understands that She can call clinic at any time with any questions, concerns, or complaints.   Greater than 50% was spent in counseling and coordination of care with this patient including but not limited to discussion of the relevant topics above (See A&P) including, but not limited to diagnosis and management of acute and chronic medical conditions.    Faythe Casa, AGNP-C [facility]- [phone] Pager- [phone] 03/16/2017 3:25 PM

## 2017-03-15 ENCOUNTER — Inpatient Hospital Stay: Payer: Medicare Other

## 2017-03-15 ENCOUNTER — Encounter: Payer: Medicare Other | Admitting: Oncology

## 2017-03-15 DIAGNOSIS — C561 Malignant neoplasm of right ovary: Secondary | ICD-10-CM | POA: Diagnosis not present

## 2017-03-15 LAB — METHYLMALONIC ACID, SERUM: Methylmalonic Acid, Quantitative: 176 nmol/L (ref 0–378)

## 2017-03-15 MED ORDER — TBO-FILGRASTIM 300 MCG/0.5ML ~~LOC~~ SOSY
300.0000 ug | PREFILLED_SYRINGE | Freq: Once | SUBCUTANEOUS | Status: AC
  Administered 2017-03-15: 300 ug via SUBCUTANEOUS

## 2017-03-16 ENCOUNTER — Other Ambulatory Visit: Payer: Self-pay | Admitting: Hematology and Oncology

## 2017-03-16 ENCOUNTER — Inpatient Hospital Stay: Payer: Medicare Other

## 2017-03-16 DIAGNOSIS — C561 Malignant neoplasm of right ovary: Secondary | ICD-10-CM | POA: Diagnosis not present

## 2017-03-16 DIAGNOSIS — I82C11 Acute embolism and thrombosis of right internal jugular vein: Secondary | ICD-10-CM

## 2017-03-16 MED ORDER — TBO-FILGRASTIM 300 MCG/0.5ML ~~LOC~~ SOSY
300.0000 ug | PREFILLED_SYRINGE | Freq: Once | SUBCUTANEOUS | Status: AC
  Administered 2017-03-16: 300 ug via SUBCUTANEOUS

## 2017-03-16 MED ORDER — ENOXAPARIN SODIUM 40 MG/0.4ML ~~LOC~~ SOLN: 60.0000 mg | Freq: Two times a day (BID) | SUBCUTANEOUS | 2 refills | Status: DC

## 2017-03-17 ENCOUNTER — Inpatient Hospital Stay: Payer: Medicare Other

## 2017-03-17 ENCOUNTER — Other Ambulatory Visit: Payer: Self-pay | Admitting: Hematology and Oncology

## 2017-03-17 DIAGNOSIS — C569 Malignant neoplasm of unspecified ovary: Secondary | ICD-10-CM

## 2017-03-17 DIAGNOSIS — C561 Malignant neoplasm of right ovary: Secondary | ICD-10-CM | POA: Diagnosis not present

## 2017-03-17 LAB — CBC WITH DIFFERENTIAL/PLATELET
Basophils Absolute: 0 10*3/uL (ref 0–0.1)
Basophils Relative: 1 %
Eosinophils Absolute: 0 10*3/uL (ref 0–0.7)
Eosinophils Relative: 0 %
HCT: 30.7 % — ABNORMAL LOW (ref 35.0–47.0)
Hemoglobin: 10.2 g/dL — ABNORMAL LOW (ref 12.0–16.0)
Lymphocytes Relative: 12 %
Lymphs Abs: 0.6 10*3/uL — ABNORMAL LOW (ref 1.0–3.6)
MCH: 27.8 pg (ref 26.0–34.0)
MCHC: 33.3 g/dL (ref 32.0–36.0)
MCV: 83.3 fL (ref 80.0–100.0)
Monocytes Absolute: 0.3 10*3/uL (ref 0.2–0.9)
Monocytes Relative: 7 %
Neutro Abs: 3.7 10*3/uL (ref 1.4–6.5)
Neutrophils Relative %: 80 %
Platelets: 238 10*3/uL (ref 150–440)
RBC: 3.69 MIL/uL — ABNORMAL LOW (ref 3.80–5.20)
RDW: 16.2 % — ABNORMAL HIGH (ref 11.5–14.5)
WBC: 4.6 10*3/uL (ref 3.6–11.0)

## 2017-03-17 LAB — MAGNESIUM: Magnesium: 1.3 mg/dL — ABNORMAL LOW (ref 1.7–2.4)

## 2017-03-17 LAB — BASIC METABOLIC PANEL
Anion gap: 8 (ref 5–15)
BUN: 20 mg/dL (ref 6–20)
CO2: 28 mmol/L (ref 22–32)
Calcium: 9.1 mg/dL (ref 8.9–10.3)
Chloride: 96 mmol/L — ABNORMAL LOW (ref 101–111)
Creatinine, Ser: 0.8 mg/dL (ref 0.44–1.00)
GFR calc Af Amer: >60 mL/min (ref >60)
GFR calc non Af Amer: >60 mL/min (ref >60)
Glucose, Bld: 157 mg/dL — ABNORMAL HIGH (ref 65–99)
Potassium: 4.2 mmol/L (ref 3.5–5.1)
Sodium: 132 mmol/L — ABNORMAL LOW (ref 135–145)

## 2017-03-17 MED ORDER — SODIUM CHLORIDE 0.9% FLUSH
3.0000 mL | Freq: Once | INTRAVENOUS | Status: DC | PRN
  Filled 2017-03-17: qty 3

## 2017-03-17 MED ORDER — MAGNESIUM SULFATE 4 GM/100ML IV SOLN
4.0000 g | Freq: Once | INTRAVENOUS | Status: AC
  Administered 2017-03-17: 4 g via INTRAVENOUS
  Filled 2017-03-17: qty 100

## 2017-03-17 MED ORDER — HEPARIN SOD (PORK) LOCK FLUSH 100 UNIT/ML IV SOLN
500.0000 [IU] | Freq: Once | INTRAVENOUS | Status: AC | PRN
  Administered 2017-03-17: 500 [IU]
  Filled 2017-03-17: qty 5

## 2017-03-17 MED ORDER — SODIUM CHLORIDE 0.9% FLUSH
10.0000 mL | Freq: Once | INTRAVENOUS | Status: DC | PRN
  Filled 2017-03-17: qty 10

## 2017-03-20 ENCOUNTER — Encounter: Payer: Self-pay | Admitting: Hematology and Oncology

## 2017-03-20 ENCOUNTER — Inpatient Hospital Stay: Payer: Medicare Other

## 2017-03-20 ENCOUNTER — Inpatient Hospital Stay (HOSPITAL_BASED_OUTPATIENT_CLINIC_OR_DEPARTMENT_OTHER): Payer: Medicare Other | Admitting: Hematology and Oncology

## 2017-03-20 ENCOUNTER — Other Ambulatory Visit: Payer: Self-pay | Admitting: *Deleted

## 2017-03-20 ENCOUNTER — Other Ambulatory Visit: Payer: Self-pay | Admitting: Hematology and Oncology

## 2017-03-20 VITALS — BP 125/70 | HR 86 | Temp 97.0°F | Resp 18 | Wt 134.2 lb

## 2017-03-20 DIAGNOSIS — D709 Neutropenia, unspecified: Secondary | ICD-10-CM

## 2017-03-20 DIAGNOSIS — I252 Old myocardial infarction: Secondary | ICD-10-CM

## 2017-03-20 DIAGNOSIS — D6481 Anemia due to antineoplastic chemotherapy: Secondary | ICD-10-CM | POA: Diagnosis not present

## 2017-03-20 DIAGNOSIS — C786 Secondary malignant neoplasm of retroperitoneum and peritoneum: Secondary | ICD-10-CM

## 2017-03-20 DIAGNOSIS — Z90710 Acquired absence of both cervix and uterus: Secondary | ICD-10-CM

## 2017-03-20 DIAGNOSIS — Z87891 Personal history of nicotine dependence: Secondary | ICD-10-CM

## 2017-03-20 DIAGNOSIS — R531 Weakness: Secondary | ICD-10-CM

## 2017-03-20 DIAGNOSIS — N1330 Unspecified hydronephrosis: Secondary | ICD-10-CM

## 2017-03-20 DIAGNOSIS — D638 Anemia in other chronic diseases classified elsewhere: Secondary | ICD-10-CM | POA: Diagnosis not present

## 2017-03-20 DIAGNOSIS — C569 Malignant neoplasm of unspecified ovary: Secondary | ICD-10-CM

## 2017-03-20 DIAGNOSIS — Z79899 Other long term (current) drug therapy: Secondary | ICD-10-CM

## 2017-03-20 DIAGNOSIS — M069 Rheumatoid arthritis, unspecified: Secondary | ICD-10-CM

## 2017-03-20 DIAGNOSIS — R0602 Shortness of breath: Secondary | ICD-10-CM

## 2017-03-20 DIAGNOSIS — R5383 Other fatigue: Secondary | ICD-10-CM

## 2017-03-20 DIAGNOSIS — I509 Heart failure, unspecified: Secondary | ICD-10-CM

## 2017-03-20 DIAGNOSIS — Z801 Family history of malignant neoplasm of trachea, bronchus and lung: Secondary | ICD-10-CM

## 2017-03-20 DIAGNOSIS — R197 Diarrhea, unspecified: Secondary | ICD-10-CM | POA: Diagnosis not present

## 2017-03-20 DIAGNOSIS — C561 Malignant neoplasm of right ovary: Secondary | ICD-10-CM

## 2017-03-20 DIAGNOSIS — K219 Gastro-esophageal reflux disease without esophagitis: Secondary | ICD-10-CM

## 2017-03-20 DIAGNOSIS — E860 Dehydration: Secondary | ICD-10-CM | POA: Diagnosis not present

## 2017-03-20 DIAGNOSIS — E876 Hypokalemia: Secondary | ICD-10-CM

## 2017-03-20 DIAGNOSIS — M549 Dorsalgia, unspecified: Secondary | ICD-10-CM

## 2017-03-20 DIAGNOSIS — Z90722 Acquired absence of ovaries, bilateral: Secondary | ICD-10-CM

## 2017-03-20 DIAGNOSIS — I2510 Atherosclerotic heart disease of native coronary artery without angina pectoris: Secondary | ICD-10-CM

## 2017-03-20 DIAGNOSIS — I10 Essential (primary) hypertension: Secondary | ICD-10-CM | POA: Diagnosis not present

## 2017-03-20 DIAGNOSIS — D72819 Decreased white blood cell count, unspecified: Secondary | ICD-10-CM

## 2017-03-20 DIAGNOSIS — R630 Anorexia: Secondary | ICD-10-CM

## 2017-03-20 DIAGNOSIS — Z7189 Other specified counseling: Secondary | ICD-10-CM

## 2017-03-20 DIAGNOSIS — Z8701 Personal history of pneumonia (recurrent): Secondary | ICD-10-CM

## 2017-03-20 DIAGNOSIS — K5900 Constipation, unspecified: Secondary | ICD-10-CM

## 2017-03-20 DIAGNOSIS — C801 Malignant (primary) neoplasm, unspecified: Secondary | ICD-10-CM

## 2017-03-20 DIAGNOSIS — C800 Disseminated malignant neoplasm, unspecified: Principal | ICD-10-CM

## 2017-03-20 DIAGNOSIS — Z87442 Personal history of urinary calculi: Secondary | ICD-10-CM

## 2017-03-20 DIAGNOSIS — E108 Type 1 diabetes mellitus with unspecified complications: Secondary | ICD-10-CM

## 2017-03-20 DIAGNOSIS — Z5111 Encounter for antineoplastic chemotherapy: Secondary | ICD-10-CM

## 2017-03-20 DIAGNOSIS — Z9641 Presence of insulin pump (external) (internal): Secondary | ICD-10-CM

## 2017-03-20 LAB — CBC WITH DIFFERENTIAL/PLATELET
Basophils Absolute: 0 10*3/uL (ref 0–0.1)
Basophils Relative: 1 %
Eosinophils Absolute: 0 10*3/uL (ref 0–0.7)
Eosinophils Relative: 0 %
HCT: 30 % — ABNORMAL LOW (ref 35.0–47.0)
Hemoglobin: 10 g/dL — ABNORMAL LOW (ref 12.0–16.0)
Lymphocytes Relative: 14 %
Lymphs Abs: 0.6 10*3/uL — ABNORMAL LOW (ref 1.0–3.6)
MCH: 28.2 pg (ref 26.0–34.0)
MCHC: 33.5 g/dL (ref 32.0–36.0)
MCV: 84.1 fL (ref 80.0–100.0)
Monocytes Absolute: 0.9 10*3/uL (ref 0.2–0.9)
Monocytes Relative: 22 %
Neutro Abs: 2.5 10*3/uL (ref 1.4–6.5)
Neutrophils Relative %: 63 %
Platelets: 248 10*3/uL (ref 150–440)
RBC: 3.56 MIL/uL — ABNORMAL LOW (ref 3.80–5.20)
RDW: 16 % — ABNORMAL HIGH (ref 11.5–14.5)
WBC: 4 10*3/uL (ref 3.6–11.0)

## 2017-03-20 LAB — COMPREHENSIVE METABOLIC PANEL
ALT: 13 U/L — ABNORMAL LOW (ref 14–54)
AST: 24 U/L (ref 15–41)
Albumin: 2.6 g/dL — ABNORMAL LOW (ref 3.5–5.0)
Alkaline Phosphatase: 91 U/L (ref 38–126)
Anion gap: 10 (ref 5–15)
BUN: 21 mg/dL — ABNORMAL HIGH (ref 6–20)
CO2: 26 mmol/L (ref 22–32)
Calcium: 8.8 mg/dL — ABNORMAL LOW (ref 8.9–10.3)
Chloride: 94 mmol/L — ABNORMAL LOW (ref 101–111)
Creatinine, Ser: 0.9 mg/dL (ref 0.44–1.00)
GFR calc Af Amer: >60 mL/min (ref >60)
GFR calc non Af Amer: >60 mL/min (ref >60)
Glucose, Bld: 371 mg/dL — ABNORMAL HIGH (ref 65–99)
Potassium: 3.8 mmol/L (ref 3.5–5.1)
Sodium: 130 mmol/L — ABNORMAL LOW (ref 135–145)
Total Bilirubin: 0.4 mg/dL (ref 0.3–1.2)
Total Protein: 6.3 g/dL — ABNORMAL LOW (ref 6.5–8.1)

## 2017-03-20 LAB — MAGNESIUM: Magnesium: 1.1 mg/dL — ABNORMAL LOW (ref 1.7–2.4)

## 2017-03-20 MED ORDER — HEPARIN SOD (PORK) LOCK FLUSH 100 UNIT/ML IV SOLN
500.0000 [IU] | Freq: Once | INTRAVENOUS | Status: AC
  Administered 2017-03-20: 500 [IU] via INTRAVENOUS
  Filled 2017-03-20: qty 5

## 2017-03-20 MED ORDER — SODIUM CHLORIDE 0.9% FLUSH
10.0000 mL | INTRAVENOUS | Status: DC | PRN
  Administered 2017-03-20: 10 mL via INTRAVENOUS
  Filled 2017-03-20: qty 10

## 2017-03-20 MED ORDER — SODIUM CHLORIDE 0.9 % IV SOLN
Freq: Once | INTRAVENOUS | Status: AC
  Administered 2017-03-20: 10:00:00 via INTRAVENOUS
  Filled 2017-03-20: qty 1000

## 2017-03-20 MED ORDER — SODIUM CHLORIDE 0.9 % IV SOLN
6.0000 g | Freq: Once | INTRAVENOUS | Status: AC
  Administered 2017-03-20: 6 g via INTRAVENOUS
  Filled 2017-03-20: qty 2

## 2017-03-20 NOTE — Progress Notes (Signed)
Today for follow up, reports "had a rough week after last chemo treatment", pt states having some vomitingx1 but daily nausea, using zofran with relief.  Pt also reports having loose stools for past 2 days, using immodium, states helped "some", last loose stool this morning, no immodium taken today.  Reports continued fatigue, tired an weakness.

## 2017-03-20 NOTE — Progress Notes (Signed)
[name]:  03/20/2017   Chief Complaint: [name] is a 71 y.o. female with stage IV ovarian cancer who is seen for assessment prior to week 2 of cycle #1 Abraxane.  HPI:  The patient was last seen in the medical oncology clinic by me on 03/13/2017.  At that time, she felt tired.  She had decided not to pursue clinical trial enrollment.  She received week 1 of cycle #1 Abraxane.  MMA was normal.  She received GCSF on 03/14/2017 - 03/16/2017.  She was seen by Faythe Casa, NP on 03/14/2017 for weakness, nausea and anorexia.  She received 1 liter NS and 8 mg of ondansetron.  Sodium was 130, BUN 21 and Cr 1.10.  Procalcitonin was 1.18.  Lactic acid was 1.5.  She returned on 03/17/2017 for labs.  WBC was 4600 with an Whitakers of 3700.  Magnesium was 1.3.  She received 4 gm of IV magnesium.  During the interim, patient has continued to have fatigue. She notes that her nausea has improved. Her first week of chemo (Abraxane) really "knocked her down".  Patient has experienced diarrhea x 3 days. She is taking Loperamide. Patient advising that the neuropathy in her hands is "a little bit worse". Patient's appetite has improved, however she has lost 4 pounds.   She would like to have this week off secondary to Thanksgiving.   Past Medical History:  Diagnosis Date  . CAD (coronary artery disease)    a. 09/2012 Cath: LM nl, LAD 95p, 71m LCX 957mOM2 50, RCA 100.  . Marland KitchenHF (congestive heart failure) ([facility]  . Cholelithiasis   . Collagen vascular disease (HCC)    Rheumatoid Arthritis.  . Esophageal stricture   . Exertional shortness of breath   . GERD (gastroesophageal reflux disease)   . H/O hiatal hernia   . Herniated disc   . History of pancytopenia   . Hyperlipidemia   . Hypertension   . Hypokalemia   . Leukopenia 2012   s/p bone marrow biopsy, Dr. PaMa Hillock. NSTEMI (non-ST elevated myocardial infarction) ([facility]/2014   "mild" (10/18/2012)  .  Ovarian cancer ([facility]   chemo and hysterectomy  . Pneumonia 2013; 08/2012   "one lung; double" (10/18/2012)  . Rheumatoid arthritis(714.0)   . Type I diabetes mellitus ([facility]   "dx'd in 1957" (10/18/2012)    Past Surgical History:  Procedure Laterality Date  . ABDOMINAL HYSTERECTOMY  06/15/2015   Dx L/S, EXLAP TAH BSO omentectomy RSRx colostomy diaphragm resection stripping  . CARDIAC CATHETERIZATION  10/18/2012   "first one was today" (10/18/2012)  . CATARACT EXTRACTION W/ INTRAOCULAR LENS IMPLANT Right 2010  . CHOLECYSTECTOMY  06/15/2015   combined case with ovarian cancer debulking  . COLON SURGERY    . CORONARY ARTERY BYPASS GRAFTING (CABG) N/A 10/19/2012   Performed by HeMelrose NakayamaMD at [facility]   "3 or 4 times" (10/19/2011)  . ESOPHAGOGASTRODUODENOSCOPY  2012   Dr. IfMinna Merritts. OSTOMY    . OVARY SURGERY     removal  . Porta Cath Insertion N/A 03/02/2015   Performed by De[facility]gernon [facility]uxleyMD [facility]  . TUBAL LIGATION  1970    Family History  Problem Relation Age of Onset  . Diabetes Mother   . Arthritis Mother   . Diabetes Father   . Arthritis Father   . Aneurysm  Sister        neck  . Cancer Sister        lung    Social History:  reports that she quit smoking about 22 years ago. Her smoking use included cigarettes. She has a 30.00 pack-year smoking history. she has never used smokeless tobacco. She reports that she does not drink alcohol or use drugs.  Patient is a retired Art therapist. Patient denies exposure to radiation and toxins. She is accompanied by her husband today.  Allergies:  Allergies  Allergen Reactions  . Codeine Nausea And Vomiting  . Latex Rash    Current Medications: Current Outpatient Medications  Medication Sig Dispense Refill  . Calcium-Vitamin D 600-200 MG-UNIT tablet Take 1 tablet by mouth 2 (two) times daily.     . cholecalciferol (VITAMIN D) 1000 units tablet Take 1,000 Units by mouth  daily.    Marland Kitchen enoxaparin (LOVENOX) 40 MG/0.4ML injection Inject 0.6 mLs (60 mg total) every 12 (twelve) hours into the skin. 60 Syringe 2  . enoxaparin (LOVENOX) 60 MG/0.6ML injection INJECT 0.6 ML (60 MG TOTAL) INTO THE SKIN EVERY 12 HOURS 60 mL 3  . folic acid (FOLVITE) 1 MG tablet Take 1 tablet (1 mg total) by mouth daily. 90 tablet 3  . furosemide (LASIX) 80 MG tablet Take 0.5 tablets by mouth 2 (two) times daily.    . insulin lispro (HUMALOG) 100 UNIT/ML injection Inject 0.06 mLs (6 Units total) into the skin daily. (Patient taking differently: Inject 0-85 Units into the skin daily. Via insulin pump) 30 mL 3  . lidocaine-prilocaine (EMLA) cream APPLY 1 APPLICATION TOPICALLY AS NEEDED 1 HOUR PRIOR TO TREATMENT. COVER WITH PRESS N SEAL UNTIL TREATMENT TIME AS DIRECTED 30 g 1  . loratadine (CLARITIN) 10 MG tablet Take 1 tablet by mouth daily as needed.    . magnesium oxide (MAG-OX) 400 (241.3 Mg) MG tablet TAKE 1 TABLET BY MOUTH ONCE DAILY 30 tablet 1  . metoprolol tartrate (LOPRESSOR) 25 MG tablet Take 1 tablet (25 mg total) by mouth 2 (two) times daily. 180 tablet 3  . ondansetron (ZOFRAN) 8 MG tablet TAKE 1 TABLET BY MOUTH EVERY 8 HOURS AS NEEDED FOR NAUSEA OR VOMITING 30 tablet 1  . pantoprazole (PROTONIX) 40 MG tablet TAKE 1 TABLET BY MOUTH TWICE (2) DAILY 180 tablet 1  . potassium chloride SA (K-DUR,KLOR-CON) 20 MEQ tablet TAKE 1 TABLET BY MOUTH ONCE A [name] 30 tablet 0  . predniSONE (DELTASONE) 5 MG tablet Take 5 mg by mouth daily with breakfast.    . vitamin C (ASCORBIC ACID) 500 MG tablet Take 500 mg by mouth daily.    Marland Kitchen zinc gluconate 50 MG tablet Take 50 mg daily by mouth.    . DULoxetine (CYMBALTA) 20 MG capsule Take by mouth.     Current Facility-Administered Medications  Medication Dose Route Frequency Provider Last Rate Last Dose  . Tbo-Filgrastim (GRANIX) injection 300 mcg  300 mcg Subcutaneous Once Lequita Asal, MD       Facility-Administered Medications Ordered in Other  Visits  Medication Dose Route Frequency Provider Last Rate Last Dose  . heparin lock flush 100 unit/mL  500 Units Intravenous Once Corcoran, Melissa C, MD      . sodium chloride 0.9 % injection 10 mL  10 mL Intracatheter PRN Corcoran, Melissa C, MD      . sodium chloride 0.9 % injection 10 mL  10 mL Intravenous PRN Corcoran, Drue Second, MD   10 mL at  04/13/15 5638  . sodium chloride flush (NS) 0.9 % injection 10 mL  10 mL Intravenous PRN Lequita Asal, MD   10 mL at 03/20/17 0850    Review of Systems:  GENERAL:  Feels "better than last week".  No fever, chills or sweats.  Weight down 4 pounds. Marland Kitchen PERFORMANCE STATUS (ECOG):  1 HEENT:  No visual changes, sore throat, mouth sores or tenderness. Lungs:  No shortness of breath.  No cough.  No hemoptysis. Cardiac:  No chest pain, palpitations, orthopnea, or PND. GI:  Appetite 50%.  Nausea, improved.  Diarrhea (see HPI).  No vomiting, melena or hematochezia. GU:  No urgency, frequency, dysuria, or hematuria. Musculoskeletal:  Back pain.  Severe rheumatoid arthritis (on prednisone).  Osteoporosis.  No back pain. No muscle tenderness. Extremities:  No pain or swelling. Skin:  No increased bruising or bleeding.  No rashes or skin changes. Neuro:  Neuropathy (slightly worse).  No headache, numbness or weakness, balance or coordination issues. Endocrine:  Diabetes on an insulin pump.  Blood sugar elevated on steroids.  Occasional hot flashes.  Psych:  No mood changes, depression or anxiety. Pain:  No pain. Review of systems:  All other systems reviewed and found to be negative.  Physical Exam:  Blood pressure 125/70, pulse 86, temperature (!) 97 F (36.1 C), temperature source Tympanic, resp. rate 18, weight 134 lb 4 oz (60.9 kg). GENERAL:  Well developed, well nourished woman sitting comfortably in the exam room in no acute distress.  She has a cane at her side. MENTAL STATUS:  Alert and oriented to person, place and time. HEAD:  Wearing a  blue cap.  Alopecia.  Normocephalic, atraumatic, face symmetric, no Cushingoid features. EYES:  Gold rimmed glasses.  Blue eyes.  No conjunctivitis or scleral icterus.  ENT: Oropharynx clear without lesion. Tongue normal. Mucous membranes moist.  RESPIRATORY: Decreased breath sounds right base.  No wheezes or rhonchi. CARDIOVASCULAR: Regular rate and rhythm without murmur, rub or gallop. ABDOMEN: Soft, , non-tender with active bowel sounds and no appreciable hepatosplenomegaly. No palpable nodularity or masses.  Insulin pump.  Ostomy bag. SKIN: No rashes or ulcers. EXTREMITIES:  No edema, skin discoloration or tenderness. No palpable cords. LYMPH NODES: No palpable cervical, supraclavicular, axillary or inguinal adenopathy  NEUROLOGICAL: Appropriate. PSYCH: Appropriate.   Radiology studies: 02/13/2015:  Abdomen and pelvic CT revealed bilateral mass-like adnexal regions (right adnexal mass 5.6 x 5.0 cm and the left adnexa mass 10.3 x 6.0 x 9.9 cm).  There was a large amount of soft tissue throughout the peritoneal cavity involving the omentum and other peritoneal surfaces.  There was a small volume ascites. There was a peripheral 3.3 x 1.9 cm low-attenuation lesion overlying the right lobe of the liver, likely representing a serosal implant. There was 1.4 x 2.2 cm ill-defined peripheral lesion within the inferior aspect of segment 6 adjacent to the ampulla in the duodenum.  04/28/2015:  Abdominal and pelvic CT revealed decreasing bilateral ovarian masses.   The left adnexal mass measured 4.0 x 6.1 cm (previously 5.0 x 8.5 cm). The right ovary measured 3.8 x 4.9 cm (previously 4.7 x 5.6 cm).  There was improved peritoneal carcinomatosis.  There was a small amount of ascites.  The hepatic dome lesion was stable. The previously seen right hepatic lobe lesion was not well-visualized.  There was a small left pleural effusion and trace right pleural effusion.  The nodular lesion at the ampulla of  Vater, extending into the duodenum was  stable.  There was no biliary ductal dilatation. 08/01/2015:  Rght upper extremity ultrasound revealed a near occlusive thrombus within the central portion of the right internal jugular vein and central portion of the right subclavian vein. 09/01/2015:  Chest, abdomen, and pelvic CT revealed continued decrease in perihepatic fluid collection contiguous with the right pleural space with percutaneous drain.  09/11/2015:  Chest, abdomen, and pelvic CT revealed a residual versus recurrent fluid collection posterior to the right hepatic lobe (2.6 x 1.1 x 4.0 cm).   10/13/2015:  Chest, abdomen, and pelvic CT revealed resolution of the empyema. 02/15/2016:  Chest, abdomen, and pelvic CT revealed multiple soft tissue nodules throughout the pelvis, small bowel mesenteric and peritoneum and, concerning for peritoneal metastatic disease.  There was soft tissue irregularity within the left upper quadrant.  There was mild right hydronephrosis (etiology unclear).  There was interval resolution of previously described right pleural based fluid and gas collection. There was a pleural based nodule within the right lower hemi-thorax concerning for pleural based metastasis.  There were small bilateral pleural effusions.  There was pulmonary nodularity, predominately within the left upper lobe (metastatic or infectious/inflammatory etiology).  There was slightly increased mediastinal adenopathy (infectious/inflammatory or metastatic).  There was a low attenuation lesion within the left hepatic lobe (complicated fluid within the fissure or metastatic disease). 03/08/2016:  Abdomen and pelvic CT revealed mild progression of peritoneal disease in the abdomen.  There was interval increase in loculated fluid around the lateral segment left liver, stomach, and spleen.  There was persistent soft tissue lesion at the level of the ampulla with mild intra and extrahepatic biliary duct dilatation.   There was persistent intrahepatic and capsular metastatic disease involving the liver. 05/26/2016:  Head MRI revealed multiple small areas of acute infarct involving the occipital parietal lobe bilaterally and the left lateral cerebellum,  consistent with posterior circulation emboli.  06/10/2016:  Adomen and pelvic CT revealed progression of peritoneal carcinomatosis predominantly in the perihepatic space, bilateral lower quadrants and pelvis.  There was worsening bilateral obstructive uropathy due to malignant involvement of the pelvic ureters.  There was stable small pleural/subpleural nodules at the right lung base.  There was stable small left pleural effusion.  There was decreased small volume perihepatic ascites.  08/09/2016:  Abdomen and pelvic CT revealed interval increase and loculated fluid collections within the peritoneal space consistent with progression of peritoneal ovarian carcinoma metastasis.  There was one large 7 cm collection in the RIGHT lower quadrant which had increased significantly in size and may represent of point of small bowel obstruction.  The proximal stomach and duodenum were decompressed. The mid small bowel was mildly dilated. There was concern for obstruction given the large intraperitoneal fluid collection.  There was increase in subcapsular hepatic metastatic fluid collections.  There was enlargement of rounded ampullary lesion in the second portion duodenum concerning for metastatic lesion.  There was mild biliary duct dilatation (stable).  There was nodular pleural metastasis in the RIGHT lower lobe pleural space. 11/03/2016:  Abdomen and pelvic CT revealed multifocal cystic metastases in the abdomen/pelvis, reflecting peritoneal disease.  The dominant lesion in the left anterior abdomen has progressed, while additional lesions were mixed.  Peritoneal disease along the liver and spleen progressed.  Pleural-based metastases along the posterior right hemithorax progressed.  There were trace left pleural effusion.  There was a stable 1.7 cm duodenal lesion at the ampulla. 11/29/2016:  Abdomen and pelvic CT revealed mild interval enlargement of pleural metastasis the RIGHT  lower lobe.  There was overall mild decrease in cystic peritoneal metastasis within the abdomen pelvis. The solid lesion at the terminal ileum was decreased in size. Subcapsular lesion in the liver was slightly decreased in size.  There was no evidence of disease progression the abdomen pelvis.  There was mild intrahepatic duct dilatation similar prior.  The ampullary lesion was slightly larger.  There was mild hydronephrosis and hydroureter on the LEFT likely related to cystic metastasis at the LEFT vesicoureteral junction 12/09/2016:  MRCP revealed the cystic lesion within the pancreatic head/uncinate process had decreased in size and demonstrated no suspicious characteristics. This can be presumed a pseudocyst or focus of side branch duct ectasia.  There was similar to slight progression of extensive peritoneal metastasis since 11/29/2016. Grossly similar abdominal nodal and right pleural metastasis.  There was chronic mild common duct dilatation with similar soft tissue fullness about the ampulla compared to 11/29/2016.  There was improved to resolved left-sided hydronephrosis. 01/20/2017:  Abdomen and pelvic CT revealed complex mixed interval changes in widespread metastatic disease in the peritoneal cavity, lower chest and thoracic, abdominal and pelvic lymph nodes. Right lower chest pleural metastasis and several of the peritoneal tumor implants had increased.  There was new right inguinal lymphadenopathy.  Some of the peritoneal tumor implants had decreased.  The small dependent left pleural effusion was slightly increased .  The mild left hydroureteronephrosis was improved.  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   The chronic biliary ductal dilatation was stable .  There was no evidence of bowel  obstruction.  Admissions: [place] from 06/15/2015 - 06/22/2015.  She underwent exploratory laparotomy, lysis of adhesions, total abdominal hysterectomy with bilateral salpingo-oophorectomy, infracolic omentectomy, optimal tumor debulking(< 1 cm), recto-sigmoid resection with creation of end colostomy, cholecystectomy, mobilization of splenic flexure and liver with diaphragmatic stripping on 06/15/2015. The right diaphragm was cleared of tumor. During dissection, the diaphragm was entered and closed with sutures.  [place] from 07/01/2015 - 07/03/2015 with pleural effusion. [place] from 07/07/2015 - 07/09/2015 with recurrent pleural effusion. [place] from 07/29/2015 - 08/10/2015 with a right-sided empyema and liver abscess. She underwent CT-guided placement of a liver abscess drain on 07/30/2015.  Liver abscess culture grew out group B strep and Enterobacter which was sensitive to Zosyn. She was transitioned to ertapenem Colbert Ewing) prior to discharge.  She was readmitted to [facility] on 09/17/2015. [place] from 05/26/2016 - 05/28/2016 with altered mental status and an acute embolic CVA.  Head MRI on 05/26/2016 revealed multiple small areas of acute infarct involving the occipital parietal lobe bilaterally and the left lateral cerebellum,  consistent with posterior circulation emboli.  Work-up included a negative carotid ultrasound and echocardiogram. [facility] from 08/10/2016 - 08/14/2016 with a small bowel obstruction.  She was managed conservatively. [place] from 09/27/2016 - 09/28/2016 with symptomatic anemia and diarrhea.   Infusion on 03/20/2017  Component Date Value Ref Range Status  . Magnesium 03/20/2017 1.1* 1.7 - 2.4 mg/dL Final  . Sodium 03/20/2017 130* 135 - 145 mmol/L Final  . Potassium 03/20/2017 3.8  3.5 - 5.1 mmol/L Final  . Chloride 03/20/2017 94* 101 - 111 mmol/L Final  . CO2 03/20/2017 26  22 - 32 mmol/L Final  . Glucose, Bld 03/20/2017 371* 65 - 99 mg/dL Final  . BUN 03/20/2017 21* 6 - 20 mg/dL Final  .  Creatinine, Ser 03/20/2017 0.90  0.44 - 1.00 mg/dL Final  . Calcium 03/20/2017 8.8* 8.9 - 10.3 mg/dL Final  . Total Protein 03/20/2017 6.3* 6.5 - 8.1  g/dL Final  . Albumin 03/20/2017 2.6* 3.5 - 5.0 g/dL Final  . AST 03/20/2017 24  15 - 41 U/L Final  . ALT 03/20/2017 13* 14 - 54 U/L Final  . Alkaline Phosphatase 03/20/2017 91  38 - 126 U/L Final  . Total Bilirubin 03/20/2017 0.4  0.3 - 1.2 mg/dL Final  . GFR calc non Af Amer 03/20/2017 >60  >60 mL/min Final  . GFR calc Af Amer 03/20/2017 >60  >60 mL/min Final   Comment: (NOTE) The eGFR has been calculated using the CKD EPI equation. This calculation has not been validated in all clinical situations. eGFR's persistently <60 mL/min signify possible Chronic Kidney Disease.   . Anion gap 03/20/2017 10  5 - 15 Final  . WBC 03/20/2017 4.0  3.6 - 11.0 K/uL Final  . RBC 03/20/2017 3.56* 3.80 - 5.20 MIL/uL Final  . Hemoglobin 03/20/2017 10.0* 12.0 - 16.0 g/dL Final  . HCT 03/20/2017 30.0* 35.0 - 47.0 % Final  . MCV 03/20/2017 84.1  80.0 - 100.0 fL Final  . MCH 03/20/2017 28.2  26.0 - 34.0 pg Final  . MCHC 03/20/2017 33.5  32.0 - 36.0 g/dL Final  . RDW 03/20/2017 16.0* 11.5 - 14.5 % Final  . Platelets 03/20/2017 248  150 - 440 K/uL Final  . Neutrophils Relative % 03/20/2017 63  % Final  . Neutro Abs 03/20/2017 2.5  1.4 - 6.5 K/uL Final  . Lymphocytes Relative 03/20/2017 14  % Final  . Lymphs Abs 03/20/2017 0.6* 1.0 - 3.6 K/uL Final  . Monocytes Relative 03/20/2017 22  % Final  . Monocytes Absolute 03/20/2017 0.9  0.2 - 0.9 K/uL Final  . Eosinophils Relative 03/20/2017 0  % Final  . Eosinophils Absolute 03/20/2017 0.0  0 - 0.7 K/uL Final  . Basophils Relative 03/20/2017 1  % Final  . Basophils Absolute 03/20/2017 0.0  0 - 0.1 K/uL Final    Assessment:  KADA FRIESEN is a 71 y.o. female with progressive stage IV ovarian cancer.  She presented with abdominal discomfort and bloating.  Omental biopsy on 02/23/2015 revealed metastatic high  grade serous carcinoma, consistent with gynecologic origin.   She was initially diagnosed with clinical stage IIIC (T3cN1Mx).  CA125 was 707 on 02/17/2015.  She received 4 cycles of neoadjuvant carboplatin and Taxol (03/05/2015 - 05/22/2015).  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  She had loose stools on oral magnesium.  She was initially on Neurontin then switched Lyrica with cycle #3.  Cycle #4 was notable for neutropenia (ANC 300) requiring GCSF x 3 days.    She underwent exploratory laparotomy, lysis of adhesions, total abdominal hysterectomy with bilateral salpingo-oophorectomy, infracolic omentectomy, optimal tumor debulking(< 1 cm), recto-sigmoid resection with creation of end colostomy, cholecystectomy, mobilization of splenic flexure and liver with diaphragmatic stripping on 06/15/2015. The right diaphragm was cleared of tumor. During dissection, the diaphragm was entered and closed with sutures.  She received tamoxifen from 02/25/2016 - 03/14/2016.  She received 4 cycles of carboplatin and gemcitabine (03/21/2016 - 05/24/2016) with GCSF/Neulasta support.  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  Cymbalta began on 02/24/2017  PDL-1 testing revealed a combined score was 5 (>=1 positive).     [facility] genetic testing negative for APC, ATM, BMPR1A, BRCA1, BRCA2, BRIP1, CDH1, CDK4, CDKN2A, CHEK2, EPCAM, MLH1, MSH2, MSH6, MUTYH, NBN, PALB2, PMS2, PTEN, RAD51C, RAD51D, SMAD4, STK11, TP53. Sequencing for select regions of POLE and POLD1, and large rearrangement analysis for select regions  of GREM1 were negative.  She received 2 cycles of Doxil (06/21/2016 - 07/19/2016) with Neulasta support.  She received 4 cycles of topotecan with Neulasta support (08/22/2016 - 10/10/2016).    She received 3 cycles of pembrolizumab (Keytruda) from 12/06/2016 -  01/09/2017.  Cycle #1 was complicated by neutropenia (ANC 400) requiring GCSF/Granix x 3 days.  She received GCSF x 5 days  with cycle #2.  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 Right lower chest pleural metastasis and several of the peritoneal tumor implants had increased.  There was new right inguinal lymphadenopathy.  Some of the peritoneal tumor implants had decreased.  The small dependent left pleural effusion was slightly increased .  The mild left hydroureteronephrosis was improved.  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   The chronic biliary ductal dilatation was stable .  There was no evidence of bowel obstruction.  CA125 was 802.9 on 03/30/2015, 567.9 on 04/13/2015, 168.8 on 05/15/2015, 85.2 on 07/17/2015, 68.6 on 07/28/2015, 34.5 on 09/17/2015, 20.7 on 11/06/2015, 49 on 01/22/2016, 106.1 on 02/22/2016, 138.8 on 03/07/2016, 220.8 on 03/21/2016, 152.8 on 04/11/2016, 125.6 on 04/18/2016, 76.8 on 05/03/2016, 60.2 on 05/24/2016, 44 on 07/19/2016, 65.8 on 08/17/2016, 53.4 on 09/12/2016, 47.3 on 10/10/2016, 91 on 12/20/2016, 141.1 on 01/09/2017, and 463.7 on 02/27/2017.  She has a history of recurrent right sided pleural effusion.  She underwent thoracentesis of 650 cc on post-operative [name] 3.  She was admitted to [facility] on 07/01/2015 and 07/07/2015 for recurrent shortness of breath.  She underwent 2 additional thoracenteses (1.1 L on 07/02/2015 and 850 cc on 07/08/2015).  Cytology was negative x 2.  Bilateral lower extremity duplex on 07/03/2015 was negative.  Echo revealed an EF of 55-60% on 07/08/2015 and 60-65% on 05/27/2016.    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 She was on Lovenox 60 mg twice a [name].  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  She has severe  rheumatoid arthritis.  Methotrexate and Enbrel were initially on hold.  She has a normocytic anemia.  Work-up on 02/17/2015 and 05/24/2016 revealed a normal ferritin, B12, folate, TSH.  She denies any melena or hematochezia.    She has anemia due to chronic disease. She received 1 unit PRBCs during her admission at [facility]. She denies any melena or hematochezia. She has diabetes and is on an insulin pump.  She has had persistent neutropenia felt secondary to her rheumatoid arthritis.  Folate and MMA were normal.  TSH was 6.13 (high) with a free T4 of 1.17 (0.61-1.12).  She began methotrexate (10 mg a week) and prednisone (5 mg a [name]) for severe rheumatoid arthritis on 12/17/2015.  She remains on prednisone alone (5 mg a [name]).  Bone marrow aspirate and biopsy on 06/09/2010 revealed a hypercellular marrow (70%) with no evidence of dysplasia or malignancy.  Flow cytometry was negative.  Cytogenetics were normal (46,XX).  FISH studies were negative for MDS.  Bone marrow aspirate and biopsy on 12/03/2015 revealed a normocellular to mildy hypercellular marrow for age (40%) with left shifted myelopoiesis, non specific dyserythropoiesis and mild megakaryocytic atypia with no increase in blasts.  There were multiple small nonspecific lymphoid aggregates (favor reactive). There was no increase  in reticulin.  There was decreased myeloid cells (37%) with left shifted maturation and 1% atypical myelod blasts.  There was relatively increased monocytic cells (11%), relatively increased lymphoid cells (36%), and relatively increased eosinophils (6%).  Cytogenetics were normal (50, XX).  SNP microarray was normal.  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  She receives IV magnesium weekly.  She has chemotherapy induced anemia.  She has received Procrit (last 11/07/2016).  Labs on 08/22/2016 revealed a normal B12 and folate.  Ferritin was 37, iron saturation 11% and TIBC 281.  Code status is DNR/DNI.  She  developed acute LFT elevation on 11/28/2016.  Event was preceded by upper abdominal discomfort.  Hepatitis B core antibody total and hepatitis C antibody were negative.  She is s/p week 1 of cycle #1 Abraxane (03/13/2017).  Symptomatically, she feels "some better".  She has a grade II neuropathy.  Exam is stable.  WBC is 4000 with an [place] of 2500. Hemoglobin is 10.0, hematocrit 30.0, and platelets 248,000.  Magnesium is 1.1 (low). Sodium low at 130. Glucose is 371.  Plan: 1.  Labs today:  CBC with diff, CMP, Mg. 2.  Hold Abraxane today secondary to patient's wishes (Thanksgiving week). 3.  Magnesium low at 1.1. Will replace with 6 grams IV today.  4.  RTC on 03/22/2017 for labs (CBC with diff, BMP, Mg), +/- Mg infusion 5.  RTC on 03/27/2017 for MD assessment, labs (CBC with diff, CMP, Mg, CA125), week 1 of cycle #2 Abraxane, and +/- IV Mg. 6.  RTC on 11/27, 11/28, and 11/29 for GCSF 300 mcg SQ.   Honor Loh, NP  03/20/2017, 9:28 AM   I saw and evaluated the patient, participating in the key portions of the service and reviewing pertinent diagnostic studies and records.  I reviewed the nurse practitioner's note and agree with the findings and the plan.  The assessment and plan were discussed with the patient.  Several questions were asked by the patient and answered.   Nolon Stalls, MD 03/20/2017,9:28 AM

## 2017-03-21 ENCOUNTER — Inpatient Hospital Stay: Payer: Medicare Other

## 2017-03-21 NOTE — Progress Notes (Signed)
Symptom Management Consult note [place]  [phone] [phone]  Patient Care Team: Leone Haven, MD as PCP - General (Family Medicine) Lequita Asal, MD as PCP - Hematology/Oncology (Hematology and Oncology) Clent Jacks, RN as Registered Nurse Jefm Bryant Susann Givens., MD (Rheumatology) Gabriel Carina Betsey Holiday, MD as Physician Assistant (Internal Medicine) Gillis Ends, MD as Referring Physician (Obstetrics and Gynecology) Mellody Drown, MD as Referring Physician (Obstetrics and Gynecology) Minna Merritts, MD as Consulting Physician (Cardiology) Jonathon Bellows, MD as Consulting Physician (Surgery)   Name of the patient: [name]  [id number]  Dec 20, 1945   Date of visit: 03/22/17  Diagnosis- stage IV ovarian cancer   Chief complaint/ Reason for visit- Magnesium infusion  Heme/Onc history: [name] is a 70 y.o. female with progressive stage IV ovarian cancer.  She presented with abdominal discomfort and bloating.  Omental biopsy on 02/23/2015 revealed metastatic high grade serous carcinoma, consistent with gynecologic origin.   She was initially diagnosed with clinical stage IIIC (T3cN1Mx).  CA125 was 707 on 02/17/2015.  She received 4 cycles of neoadjuvant carboplatin and Taxol (03/05/2015 - 05/22/2015).  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  She had loose stools on oral magnesium.  She was initially on Neurontin then switched Lyrica with cycle #3.  Cycle #4 was notable for neutropenia (ANC 300) requiring GCSF x 3 days.    She underwent exploratory laparotomy, lysis of adhesions, total abdominal hysterectomy with bilateral salpingo-oophorectomy, infracolic omentectomy, optimal tumor debulking(< 1 cm), recto-sigmoid resection with creation of end colostomy, cholecystectomy, mobilization of splenic flexure and liver with diaphragmatic stripping on 06/15/2015. The right diaphragm was cleared of  tumor. During dissection, the diaphragm was entered and closed with sutures.  She received tamoxifen from 02/25/2016 - 03/14/2016.  She received 4 cycles of carboplatin and gemcitabine (03/21/2016 - 05/24/2016) with GCSF/Neulasta support.  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  Cymbalta began on 02/24/2017  PDL-1 testing revealed a combined score was 5 (>=1 positive).     [facility] genetic testing negative for APC, ATM, BMPR1A, BRCA1, BRCA2, BRIP1, CDH1, CDK4, CDKN2A, CHEK2, EPCAM, MLH1, MSH2, MSH6, MUTYH, NBN, PALB2, PMS2, PTEN, RAD51C, RAD51D, SMAD4, STK11, TP53. Sequencing for select regions of POLE and POLD1, and large rearrangement analysis for select regions of GREM1 were negative.  She received 2 cycles of Doxil (06/21/2016 - 07/19/2016) with Neulasta support.  She received 4 cycles of topotecan with Neulasta support (08/22/2016 - 10/10/2016).    She received 3 cycles of pembrolizumab (Keytruda) from 12/06/2016 -  01/09/2017.  Cycle #1 was complicated by neutropenia (ANC 400) requiring GCSF/Granix x 3 days.  She received GCSF x 5 days with cycle #2.  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 Right lower chest pleural metastasis and several of the peritoneal tumor implants had increased.  There was new right inguinal lymphadenopathy.  Some of the peritoneal tumor implants had decreased.  The small dependent left pleural effusion was slightly increased .  The mild left hydroureteronephrosis was improved.  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   The chronic biliary ductal dilatation was stable .  There was no evidence of bowel obstruction.  CA125 was 802.9 on 03/30/2015, 567.9 on 04/13/2015, 168.8 on 05/15/2015, 85.2 on 07/17/2015, 68.6 on 07/28/2015, 34.5 on 09/17/2015, 20.7 on 11/06/2015, 49 on 01/22/2016,  106.1 on  02/22/2016, 138.8 on 03/07/2016, 220.8 on 03/21/2016, 152.8 on 04/11/2016, 125.6 on 04/18/2016, 76.8 on 05/03/2016, 60.2 on 05/24/2016, 44 on 07/19/2016, 65.8 on 08/17/2016, 53.4 on 09/12/2016, 47.3 on 10/10/2016, 91 on 12/20/2016, 141.1 on 01/09/2017, and 463.7 on 02/27/2017.  She has a history of recurrent right sided pleural effusion.  She underwent thoracentesis of 650 cc on post-operative day 3.  She was admitted to [facility] on 07/01/2015 and 07/07/2015 for recurrent shortness of breath.  She underwent 2 additional thoracenteses (1.1 L on 07/02/2015 and 850 cc on 07/08/2015).  Cytology was negative x 2.  Bilateral lower extremity duplex on 07/03/2015 was negative.  Echo revealed an EF of 55-60% on 07/08/2015 and 60-65% on 05/27/2016.    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 She was on Lovenox 60 mg twice a day.  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  She has severe rheumatoid arthritis.  Methotrexate and Enbrel were initially on hold.  She has a normocytic anemia.  Work-up on 02/17/2015 and 05/24/2016 revealed a normal ferritin, B12, folate, TSH.  She denies any melena or hematochezia.    She has anemia due to chronic disease. She received 1 unit PRBCs during her admission at [facility]. She denies any melena or hematochezia. She has diabetes and is on an insulin pump.  She has had persistent neutropenia felt secondary to her rheumatoid arthritis.  Folate and MMA were normal.  TSH was 6.13 (high) with a free T4 of 1.17 (0.61-1.12).  She began methotrexate (10 mg a week) and prednisone (5 mg a day) for severe rheumatoid arthritis on 12/17/2015.  She remains on prednisone alone (5 mg a day).  Bone marrow aspirate and biopsy on 06/09/2010 revealed a hypercellular marrow (70%) with no evidence of dysplasia or  malignancy.  Flow cytometry was negative.  Cytogenetics were normal (46,XX).  FISH studies were negative for MDS.  Bone marrow aspirate and biopsy on 12/03/2015 revealed a normocellular to mildy hypercellular marrow for age (40%) with left shifted myelopoiesis, non specific dyserythropoiesis and mild megakaryocytic atypia with no increase in blasts.  There were multiple small nonspecific lymphoid aggregates (favor reactive). There was no increase in reticulin.  There was decreased myeloid cells (37%) with left shifted maturation and 1% atypical myelod blasts.  There was relatively increased monocytic cells (11%), relatively increased lymphoid cells (36%), and relatively increased eosinophils (6%).  Cytogenetics were normal (58, XX).  SNP microarray was normal.  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  She receives IV magnesium weekly.  She has chemotherapy induced anemia.  She has received Procrit (last 11/07/2016).  Labs on 08/22/2016 revealed a normal B12 and folate.  Ferritin was 37, iron saturation 11% and TIBC 281.  Code status is DNR/DNI.  She developed acute LFT elevation on 11/28/2016.  Event was preceded by upper abdominal discomfort.  Hepatitis B core antibody total and hepatitis C antibody were negative.  She is s/p week 1 of cycle #1 Abraxane (03/13/2017  Interval history- Patient was last seen by Dr. Mike Gip on 03/20/2017 her labs, MD assessment and Abraxane. She asked to have this week off secondary to the holiday. She continued to complain of weakness and occasional nausea but this had improved from her visit with symptom management on 03/14/2017. She returned to clinic on 11/16 and 11/19 to have labs  drawn because her magnesium continued to be low. She was given 4 g on 11/16 and 6 g on 11/19.   Today she presents for labs with possible magnesium infusion. Patient states she feels fine and would never have known that her magnesium was low. She denies diarrhea and had  a formed stool this morning. She denies fever, chest pain, shortness of breath or any additional complaints at this time.  ECOG FS:0 - Asymptomatic  Review of systems- Review of Systems  Constitutional: Positive for malaise/fatigue. Negative for chills and fever.  HENT: Negative.   Eyes: Negative.   Respiratory: Negative.   Cardiovascular: Negative.   Gastrointestinal: Negative.   Genitourinary: Negative.   Musculoskeletal: Negative.   Skin: Negative.   Neurological: Positive for weakness.  Endo/Heme/Allergies: Negative.   Psychiatric/Behavioral: Negative.     Current treatment- Abraxane weekly for 3 weeks with one week off q 28 days.  Allergies  Allergen Reactions  . Codeine Nausea And Vomiting  . Latex Rash     Past Medical History:  Diagnosis Date  . CAD (coronary artery disease)    a. 09/2012 Cath: LM nl, LAD 95p, 51m LCX 915mOM2 50, RCA 100.  . Marland KitchenHF (congestive heart failure) ([place]  . Cholelithiasis   . Collagen vascular disease (HCC)    Rheumatoid Arthritis.  . Esophageal stricture   . Exertional shortness of breath   . GERD (gastroesophageal reflux disease)   . H/O hiatal hernia   . Herniated disc   . History of pancytopenia   . Hyperlipidemia   . Hypertension   . Hypokalemia   . Leukopenia 2012   s/p bone marrow biopsy, Dr. PaMa Hillock. NSTEMI (non-ST elevated myocardial infarction) ([place]/2014   "mild" (10/18/2012)  . Ovarian cancer ([facility]   chemo and hysterectomy  . Pneumonia 2013; 08/2012   "one lung; double" (10/18/2012)  . Rheumatoid arthritis(714.0)   . Type I diabetes mellitus ([facility]   "dx'd in 1957" (10/18/2012)     Past Surgical History:  Procedure Laterality Date  . ABDOMINAL HYSTERECTOMY  06/15/2015   Dx L/S, EXLAP TAH BSO omentectomy RSRx colostomy diaphragm resection stripping  . CARDIAC CATHETERIZATION  10/18/2012   "first one was today" (10/18/2012)  . CATARACT EXTRACTION W/ INTRAOCULAR LENS IMPLANT Right 2010  . CHOLECYSTECTOMY   06/15/2015   combined case with ovarian cancer debulking  . COLON SURGERY    . CORONARY ARTERY BYPASS GRAFT N/A 10/19/2012   Procedure: CORONARY ARTERY BYPASS GRAFTING (CABG);  Surgeon: StMelrose NakayamaMD;  [facility]: Open [facility] Surgery;  Laterality: N/A;  . ESOPHAGEAL DILATION     "3 or 4 times" (10/19/2011)  . ESOPHAGOGASTRODUODENOSCOPY  2012   Dr. IfMinna Merritts. OSTOMY    . OVARY SURGERY     removal  . PERIPHERAL VASCULAR CATHETERIZATION N/A 03/02/2015   Procedure: PoGlori Luisath Insertion;  Surgeon: JaAlgernon HuxleyMD;  [facility];  Service: Cardiovascular;  Laterality: N/A;  . TUBAL LIGATION  1970    Social History   Socioeconomic History  . Marital status: Married    Spouse name: Not on file  . Number of children: 3  . Years of education: Not on file  . Highest education level: Not on file  Social Needs  . Financial resource strain: Not on file  . Food insecurity - worry: Not on file  . Food insecurity - inability: Not on file  . Transportation needs - medical:  Not on file  . Transportation needs - non-medical: Not on file  Occupational History  . Occupation: Retired    Fish farm manager: retired  Tobacco Use  . Smoking status: Former Smoker    Packs/day: 1.00    Years: 30.00    Pack years: 30.00    Types: Cigarettes    Last attempt to quit: 06/11/1994    Years since quitting: 22.7  . Smokeless tobacco: Never Used  Substance and Sexual Activity  . Alcohol use: No  . Drug use: No  . Sexual activity: No  Other Topics Concern  . Not on file  Social History Narrative  . Not on file    Family History  Problem Relation Age of Onset  . Diabetes Mother   . Arthritis Mother   . Diabetes Father   . Arthritis Father   . Aneurysm Sister        neck  . Cancer Sister        lung     Current Outpatient Medications:  .  Calcium-Vitamin D 600-200 MG-UNIT tablet, Take 1 tablet by mouth 2 (two) times daily. , Disp: , Rfl:  .  cholecalciferol  (VITAMIN D) 1000 units tablet, Take 1,000 Units by mouth daily., Disp: , Rfl:  .  DULoxetine (CYMBALTA) 20 MG capsule, Take by mouth., Disp: , Rfl:  .  enoxaparin (LOVENOX) 40 MG/0.4ML injection, Inject 0.6 mLs (60 mg total) every 12 (twelve) hours into the skin., Disp: 60 Syringe, Rfl: 2 .  enoxaparin (LOVENOX) 60 MG/0.6ML injection, INJECT 0.6 ML (60 MG TOTAL) INTO THE SKIN EVERY 12 HOURS, Disp: 60 mL, Rfl: 3 .  folic acid (FOLVITE) 1 MG tablet, Take 1 tablet (1 mg total) by mouth daily., Disp: 90 tablet, Rfl: 3 .  furosemide (LASIX) 80 MG tablet, Take 0.5 tablets by mouth 2 (two) times daily., Disp: , Rfl:  .  insulin lispro (HUMALOG) 100 UNIT/ML injection, Inject 0.06 mLs (6 Units total) into the skin daily. (Patient taking differently: Inject 0-85 Units into the skin daily. Via insulin pump), Disp: 30 mL, Rfl: 3 .  lidocaine-prilocaine (EMLA) cream, APPLY 1 APPLICATION TOPICALLY AS NEEDED 1 HOUR PRIOR TO TREATMENT. COVER WITH PRESS N SEAL UNTIL TREATMENT TIME AS DIRECTED, Disp: 30 g, Rfl: 1 .  loratadine (CLARITIN) 10 MG tablet, Take 1 tablet by mouth daily as needed., Disp: , Rfl:  .  magnesium oxide (MAG-OX) 400 (241.3 Mg) MG tablet, TAKE 1 TABLET BY MOUTH ONCE DAILY, Disp: 30 tablet, Rfl: 1 .  metoprolol tartrate (LOPRESSOR) 25 MG tablet, Take 1 tablet (25 mg total) by mouth 2 (two) times daily., Disp: 180 tablet, Rfl: 3 .  ondansetron (ZOFRAN) 8 MG tablet, TAKE 1 TABLET BY MOUTH EVERY 8 HOURS AS NEEDED FOR NAUSEA OR VOMITING, Disp: 30 tablet, Rfl: 1 .  pantoprazole (PROTONIX) 40 MG tablet, TAKE 1 TABLET BY MOUTH TWICE (2) DAILY, Disp: 180 tablet, Rfl: 1 .  potassium chloride SA (K-DUR,KLOR-CON) 20 MEQ tablet, TAKE 1 TABLET BY MOUTH ONCE A DAY, Disp: 30 tablet, Rfl: 0 .  predniSONE (DELTASONE) 5 MG tablet, Take 5 mg by mouth daily with breakfast., Disp: , Rfl:  .  vitamin C (ASCORBIC ACID) 500 MG tablet, Take 500 mg by mouth daily., Disp: , Rfl:  .  zinc gluconate 50 MG tablet, Take 50 mg  daily by mouth., Disp: , Rfl:   Current Facility-Administered Medications:  .  Tbo-Filgrastim (GRANIX) injection 300 mcg, 300 mcg, Subcutaneous, Once, Corcoran, Drue Second, MD  Facility-Administered  Medications Ordered in Other Visits:  .  magnesium sulfate IVPB 4 g 100 mL, 4 g, Intravenous, Once, Burns, Jennifer E, NP .  sodium chloride 0.9 % injection 10 mL, 10 mL, Intracatheter, PRN, Corcoran, Melissa C, MD .  sodium chloride 0.9 % injection 10 mL, 10 mL, Intravenous, PRN, Mike Gip, Melissa C, MD, 10 mL at 04/13/15 0852 .  sodium chloride flush (NS) 0.9 % injection 10 mL, 10 mL, Intravenous, PRN, Mike Gip, Melissa C, MD, 10 mL at 03/22/17 2094  Physical exam:  Vitals:   03/22/17 0955  BP: 118/70  Pulse: 88  Temp: (!) 97 F (36.1 C)  TempSrc: Tympanic  Weight: 136 lb 4.8 oz (61.8 kg)   Physical Exam  Constitutional: She is oriented to person, place, and time and well-developed, well-nourished, and in no distress.  HENT:  Head: Normocephalic and atraumatic.  Eyes: Pupils are equal, round, and reactive to light.  Neck: Normal range of motion.  Cardiovascular: Normal rate and regular rhythm.  Pulmonary/Chest: Effort normal and breath sounds normal.  Abdominal: Soft. Bowel sounds are normal.  Musculoskeletal: Normal range of motion.  Neurological: She is alert and oriented to person, place, and time.  Skin: Skin is warm and dry.     CMP Latest Ref Rng & Units 03/22/2017  Glucose 65 - 99 mg/dL 294(H)  BUN 6 - 20 mg/dL 17  Creatinine 0.44 - 1.00 mg/dL 0.86  Sodium 135 - 145 mmol/L 129(L)  Potassium 3.5 - 5.1 mmol/L 4.1  Chloride 101 - 111 mmol/L 94(L)  CO2 22 - 32 mmol/L 27  Calcium 8.9 - 10.3 mg/dL 9.0  Total Protein 6.5 - 8.1 g/dL -  Total Bilirubin 0.3 - 1.2 mg/dL -  Alkaline Phos 38 - 126 U/L -  AST 15 - 41 U/L -  ALT 14 - 54 U/L -   CBC Latest Ref Rng & Units 03/22/2017  WBC 3.6 - 11.0 K/uL 4.4  Hemoglobin 12.0 - 16.0 g/dL 10.0(L)  Hematocrit 35.0 - 47.0 % 29.9(L)   Platelets 150 - 440 K/uL 238    No images are attached to the encounter.  Dg Chest 2 View  Result Date: 02/27/2017 CLINICAL DATA:  Shortness of breath, right posterior chest pain and history of metastatic ovarian carcinoma EXAM: CHEST  2 VIEW COMPARISON:  CT of the abdomen and pelvis on 01/20/2017 and CT of the chest on 02/15/2016 FINDINGS: Stable appearance of Port-A-Cath. Normal heart size status post CABG. 2.3 cm lung nodule in the right mid lung. Bibasilar atelectasis present with potential tiny bilateral pleural effusions. No airspace consolidation or edema. No pneumothorax. Bony structures appear unremarkable with stable mild loss of height of the T12 vertebral body. IMPRESSION: Bibasilar atelectasis with probable small bilateral pleural effusions. 2.3 cm right lung nodule. Electronically Signed   By: Aletta Edouard M.D.   On: 02/27/2017 11:13   Dg Chest Right Decubitus  Result Date: 02/27/2017 CLINICAL DATA:  Metastatic ovarian carcinoma and possible pleural effusions. EXAM: CHEST - RIGHT DECUBITUS COMPARISON:  Two-view chest x-ray earlier today. FINDINGS: Decubitus view with the right side down shows a trace amount of layering right pleural fluid. Left pleural effusion not able to be assessed. IMPRESSION: Trace layering right pleural effusion. Electronically Signed   By: Aletta Edouard M.D.   On: 02/27/2017 11:15    Assessment and plan- Patient is a 71 y.o. female who presented with persistent hypomagnesemia. Labs reveal hypomagnesemia (1.3)  and hyponatremia (129). Will give patient 6 g of magnesium today and  1 liter NaCl. Patient's vital signs are stable. Patient's lung and heart sounds are normal.   1. Hyponatremia: Give 1 liter NaCl.  2. Hypomagnesemia: Give 6 g Magnesium. 3. RTC as scheduled on Monday for labs/MD and possible Abraxane.   Visit Diagnosis 1. Hypomagnesemia   2. Non-intractable vomiting with nausea, unspecified vomiting type   3. Hyponatremia     Patient  expressed understanding and was in agreement with this plan. She also understands that She can call clinic at any time with any questions, concerns, or complaints.   Greater than 50% was spent in counseling and coordination of care with this patient including but not limited to discussion of the relevant topics above (See A&P) including, but not limited to diagnosis and management of acute and chronic medical conditions.    Faythe Casa, AGNP-C [facility]- [phone] Pager- [phone] 03/22/2017 3:19 PM

## 2017-03-22 ENCOUNTER — Ambulatory Visit: Payer: Medicare Other

## 2017-03-22 ENCOUNTER — Inpatient Hospital Stay (HOSPITAL_BASED_OUTPATIENT_CLINIC_OR_DEPARTMENT_OTHER): Payer: Medicare Other | Admitting: Oncology

## 2017-03-22 ENCOUNTER — Inpatient Hospital Stay: Payer: Medicare Other

## 2017-03-22 ENCOUNTER — Other Ambulatory Visit: Payer: Self-pay | Admitting: Oncology

## 2017-03-22 ENCOUNTER — Encounter: Payer: Self-pay | Admitting: Oncology

## 2017-03-22 ENCOUNTER — Other Ambulatory Visit: Payer: Self-pay

## 2017-03-22 ENCOUNTER — Other Ambulatory Visit: Payer: Self-pay | Admitting: Hematology and Oncology

## 2017-03-22 DIAGNOSIS — R630 Anorexia: Secondary | ICD-10-CM | POA: Diagnosis not present

## 2017-03-22 DIAGNOSIS — Z801 Family history of malignant neoplasm of trachea, bronchus and lung: Secondary | ICD-10-CM

## 2017-03-22 DIAGNOSIS — C561 Malignant neoplasm of right ovary: Secondary | ICD-10-CM

## 2017-03-22 DIAGNOSIS — R0602 Shortness of breath: Secondary | ICD-10-CM

## 2017-03-22 DIAGNOSIS — E871 Hypo-osmolality and hyponatremia: Secondary | ICD-10-CM

## 2017-03-22 DIAGNOSIS — Z8701 Personal history of pneumonia (recurrent): Secondary | ICD-10-CM

## 2017-03-22 DIAGNOSIS — Z87442 Personal history of urinary calculi: Secondary | ICD-10-CM

## 2017-03-22 DIAGNOSIS — K219 Gastro-esophageal reflux disease without esophagitis: Secondary | ICD-10-CM

## 2017-03-22 DIAGNOSIS — E108 Type 1 diabetes mellitus with unspecified complications: Secondary | ICD-10-CM

## 2017-03-22 DIAGNOSIS — E709 Disorder of aromatic amino-acid metabolism, unspecified: Secondary | ICD-10-CM

## 2017-03-22 DIAGNOSIS — R197 Diarrhea, unspecified: Secondary | ICD-10-CM | POA: Diagnosis not present

## 2017-03-22 DIAGNOSIS — D638 Anemia in other chronic diseases classified elsewhere: Secondary | ICD-10-CM | POA: Diagnosis not present

## 2017-03-22 DIAGNOSIS — Z90710 Acquired absence of both cervix and uterus: Secondary | ICD-10-CM

## 2017-03-22 DIAGNOSIS — D72819 Decreased white blood cell count, unspecified: Secondary | ICD-10-CM

## 2017-03-22 DIAGNOSIS — Z9641 Presence of insulin pump (external) (internal): Secondary | ICD-10-CM

## 2017-03-22 DIAGNOSIS — C786 Secondary malignant neoplasm of retroperitoneum and peritoneum: Secondary | ICD-10-CM | POA: Diagnosis not present

## 2017-03-22 DIAGNOSIS — K5900 Constipation, unspecified: Secondary | ICD-10-CM

## 2017-03-22 DIAGNOSIS — R112 Nausea with vomiting, unspecified: Secondary | ICD-10-CM | POA: Diagnosis not present

## 2017-03-22 DIAGNOSIS — I2510 Atherosclerotic heart disease of native coronary artery without angina pectoris: Secondary | ICD-10-CM

## 2017-03-22 DIAGNOSIS — R5383 Other fatigue: Secondary | ICD-10-CM

## 2017-03-22 DIAGNOSIS — R531 Weakness: Secondary | ICD-10-CM

## 2017-03-22 DIAGNOSIS — N1330 Unspecified hydronephrosis: Secondary | ICD-10-CM

## 2017-03-22 DIAGNOSIS — M549 Dorsalgia, unspecified: Secondary | ICD-10-CM

## 2017-03-22 DIAGNOSIS — K449 Diaphragmatic hernia without obstruction or gangrene: Secondary | ICD-10-CM

## 2017-03-22 DIAGNOSIS — E876 Hypokalemia: Secondary | ICD-10-CM

## 2017-03-22 DIAGNOSIS — Z79899 Other long term (current) drug therapy: Secondary | ICD-10-CM

## 2017-03-22 DIAGNOSIS — I252 Old myocardial infarction: Secondary | ICD-10-CM

## 2017-03-22 DIAGNOSIS — C569 Malignant neoplasm of unspecified ovary: Secondary | ICD-10-CM

## 2017-03-22 DIAGNOSIS — M069 Rheumatoid arthritis, unspecified: Secondary | ICD-10-CM

## 2017-03-22 DIAGNOSIS — I509 Heart failure, unspecified: Secondary | ICD-10-CM

## 2017-03-22 DIAGNOSIS — I10 Essential (primary) hypertension: Secondary | ICD-10-CM

## 2017-03-22 DIAGNOSIS — E860 Dehydration: Secondary | ICD-10-CM

## 2017-03-22 DIAGNOSIS — Z87891 Personal history of nicotine dependence: Secondary | ICD-10-CM

## 2017-03-22 DIAGNOSIS — Z90722 Acquired absence of ovaries, bilateral: Secondary | ICD-10-CM

## 2017-03-22 DIAGNOSIS — D6481 Anemia due to antineoplastic chemotherapy: Secondary | ICD-10-CM

## 2017-03-22 LAB — BASIC METABOLIC PANEL
Anion gap: 8 (ref 5–15)
BUN: 17 mg/dL (ref 6–20)
CO2: 27 mmol/L (ref 22–32)
Calcium: 9 mg/dL (ref 8.9–10.3)
Chloride: 94 mmol/L — ABNORMAL LOW (ref 101–111)
Creatinine, Ser: 0.86 mg/dL (ref 0.44–1.00)
GFR calc Af Amer: >60 mL/min (ref >60)
GFR calc non Af Amer: >60 mL/min (ref >60)
Glucose, Bld: 294 mg/dL — ABNORMAL HIGH (ref 65–99)
Potassium: 4.1 mmol/L (ref 3.5–5.1)
Sodium: 129 mmol/L — ABNORMAL LOW (ref 135–145)

## 2017-03-22 LAB — CBC WITH DIFFERENTIAL/PLATELET
Basophils Absolute: 0.1 10*3/uL (ref 0–0.1)
Basophils Relative: 1 %
Eosinophils Absolute: 0 10*3/uL (ref 0–0.7)
Eosinophils Relative: 0 %
HCT: 29.9 % — ABNORMAL LOW (ref 35.0–47.0)
Hemoglobin: 10 g/dL — ABNORMAL LOW (ref 12.0–16.0)
Lymphocytes Relative: 12 %
Lymphs Abs: 0.5 10*3/uL — ABNORMAL LOW (ref 1.0–3.6)
MCH: 28 pg (ref 26.0–34.0)
MCHC: 33.5 g/dL (ref 32.0–36.0)
MCV: 83.5 fL (ref 80.0–100.0)
Monocytes Absolute: 0.6 10*3/uL (ref 0.2–0.9)
Monocytes Relative: 14 %
Neutro Abs: 3.2 10*3/uL (ref 1.4–6.5)
Neutrophils Relative %: 73 %
Platelets: 238 10*3/uL (ref 150–440)
RBC: 3.58 MIL/uL — ABNORMAL LOW (ref 3.80–5.20)
RDW: 16.3 % — ABNORMAL HIGH (ref 11.5–14.5)
WBC: 4.4 10*3/uL (ref 3.6–11.0)

## 2017-03-22 LAB — MAGNESIUM: Magnesium: 1.3 mg/dL — ABNORMAL LOW (ref 1.7–2.4)

## 2017-03-22 MED ORDER — MAGNESIUM SULFATE 4 GM/100ML IV SOLN: 4.0000 g | Freq: Once | INTRAVENOUS | Status: AC

## 2017-03-22 MED ORDER — SODIUM CHLORIDE 0.9 % IV SOLN: 4.0000 g | Freq: Once | INTRAVENOUS | Status: DC

## 2017-03-22 MED ORDER — SODIUM CHLORIDE 0.9% FLUSH
10.0000 mL | INTRAVENOUS | Status: DC | PRN
  Administered 2017-03-22: 10 mL via INTRAVENOUS
  Filled 2017-03-22: qty 10

## 2017-03-22 MED ORDER — HEPARIN SOD (PORK) LOCK FLUSH 100 UNIT/ML IV SOLN
500.0000 [IU] | Freq: Once | INTRAVENOUS | Status: AC
  Administered 2017-03-22: 500 [IU] via INTRAVENOUS
  Filled 2017-03-22: qty 5

## 2017-03-22 MED ORDER — SODIUM CHLORIDE 0.9 % IV SOLN
6.0000 g | Freq: Once | INTRAVENOUS | Status: AC
  Administered 2017-03-22: 6 g via INTRAVENOUS
  Filled 2017-03-22: qty 12

## 2017-03-25 ENCOUNTER — Other Ambulatory Visit: Payer: Self-pay | Admitting: Hematology and Oncology

## 2017-03-25 DIAGNOSIS — C800 Disseminated malignant neoplasm, unspecified: Principal | ICD-10-CM

## 2017-03-25 DIAGNOSIS — C561 Malignant neoplasm of right ovary: Secondary | ICD-10-CM

## 2017-03-25 NOTE — Progress Notes (Signed)
[facility] day:  03/27/2017   Chief Complaint: [name] is a 71 y.o. female with stage IV ovarian cancer who is seen for assessment prior to week 1 of cycle #2 Abraxane.  HPI:  The patient was last seen in the medical oncology clinic by me on 03/20/2017.  At that time, she felt tired.  She felt fatigued s/p week 1 of cycle #1 Abraxane.  Neuropathy was "a little worse".  She decided to postpone chemotherapy until after Thanksgiving.  Magnesium was 1.1.  She received 6 gm of IV magnesium.  She returned on 03/22/2017.  Magnesium was 1.3.  She received 4 gm of IV magnesium.  During the interim, patient notes that she had a "wonderful" Thanksgiving. She complains of worsening neuropathy in her hands and feet today. She denies any nausea and vomiting. Patient is eating well. She has gained 1 pound. Patient denies any B symptoms or interval infections.    Past Medical History:  Diagnosis Date  . CAD (coronary artery disease)    a. 09/2012 Cath: LM nl, LAD 95p, 69m LCX 954mOM2 50, RCA 100.  . Marland KitchenHF (congestive heart failure) ([facility]  . Cholelithiasis   . Collagen vascular disease (HCC)    Rheumatoid Arthritis.  . Esophageal stricture   . Exertional shortness of breath   . GERD (gastroesophageal reflux disease)   . H/O hiatal hernia   . Herniated disc   . History of pancytopenia   . Hyperlipidemia   . Hypertension   . Hypokalemia   . Leukopenia 2012   s/p bone marrow biopsy, Dr. PaMa Hillock. NSTEMI (non-ST elevated myocardial infarction) ([facility]/2014   "mild" (10/18/2012)  . Ovarian cancer ([facility] [place]   chemo and hysterectomy  . Pneumonia 2013; 08/2012   "one lung; double" (10/18/2012)  . Rheumatoid arthritis(714.0)   . Type I diabetes mellitus ([place]   "dx'd in 1957" (10/18/2012)    Past Surgical History:  Procedure Laterality Date  . ABDOMINAL HYSTERECTOMY  06/15/2015   Dx L/S, EXLAP TAH BSO omentectomy RSRx colostomy diaphragm  resection stripping  . CARDIAC CATHETERIZATION  10/18/2012   "first one was today" (10/18/2012)  . CATARACT EXTRACTION W/ INTRAOCULAR LENS IMPLANT Right 2010  . CHOLECYSTECTOMY  06/15/2015   combined case with ovarian cancer debulking  . COLON SURGERY    . CORONARY ARTERY BYPASS GRAFT N/A 10/19/2012   Procedure: CORONARY ARTERY BYPASS GRAFTING (CABG);  Surgeon: StMelrose NakayamaMD;  [facility] Service: Open [facility] Surgery;  Laterality: N/A;  . ESOPHAGEAL DILATION     "3 or 4 times" (10/19/2011)  . ESOPHAGOGASTRODUODENOSCOPY  2012   Dr. IfMinna Merritts. OSTOMY    . OVARY SURGERY     removal  . PERIPHERAL VASCULAR CATHETERIZATION N/A 03/02/2015   Procedure: PoGlori Luisath Insertion;  Surgeon: JaAlgernon HuxleyMD;  [facility];  Service: Cardiovascular;  Laterality: N/A;  . TUBAL LIGATION  1970    Family History  Problem Relation Age of Onset  . Diabetes Mother   . Arthritis Mother   . Diabetes Father   . Arthritis Father   . Aneurysm Sister        neck  . Cancer Sister        lung    Social History:  reports that she quit smoking about 22 years ago. Her smoking use included cigarettes. She has a 30.00 pack-year smoking history. she has never  used smokeless tobacco. She reports that she does not drink alcohol or use drugs.  Patient is a retired Art therapist. Patient denies exposure to radiation and toxins. She is accompanied by her husband today.  Allergies:  Allergies  Allergen Reactions  . Codeine Nausea And Vomiting  . Latex Rash    Current Medications: Current Outpatient Medications  Medication Sig Dispense Refill  . Calcium-Vitamin D 600-200 MG-UNIT tablet Take 1 tablet by mouth 2 (two) times daily.     . cholecalciferol (VITAMIN D) 1000 units tablet Take 1,000 Units by mouth daily.    . DULoxetine (CYMBALTA) 20 MG capsule Take by mouth.    . enoxaparin (LOVENOX) 40 MG/0.4ML injection Inject 0.6 mLs (60 mg total) every 12 (twelve) hours into the skin.  60 Syringe 2  . enoxaparin (LOVENOX) 60 MG/0.6ML injection INJECT 0.6 ML (60 MG TOTAL) INTO THE SKIN EVERY 12 HOURS 60 mL 3  . folic acid (FOLVITE) 1 MG tablet Take 1 tablet (1 mg total) by mouth daily. 90 tablet 3  . furosemide (LASIX) 80 MG tablet Take 0.5 tablets by mouth 2 (two) times daily.    . insulin lispro (HUMALOG) 100 UNIT/ML injection Inject 0.06 mLs (6 Units total) into the skin daily. (Patient taking differently: Inject 0-85 Units into the skin daily. Via insulin pump) 30 mL 3  . lidocaine-prilocaine (EMLA) cream APPLY 1 APPLICATION TOPICALLY AS NEEDED 1 HOUR PRIOR TO TREATMENT. COVER WITH PRESS N SEAL UNTIL TREATMENT TIME AS DIRECTED 30 g 1  . loratadine (CLARITIN) 10 MG tablet Take 1 tablet by mouth daily as needed.    . magnesium oxide (MAG-OX) 400 (241.3 Mg) MG tablet TAKE 1 TABLET BY MOUTH ONCE DAILY 30 tablet 1  . metoprolol tartrate (LOPRESSOR) 25 MG tablet Take 1 tablet (25 mg total) by mouth 2 (two) times daily. 180 tablet 3  . ondansetron (ZOFRAN) 8 MG tablet TAKE 1 TABLET BY MOUTH EVERY 8 HOURS AS NEEDED FOR NAUSEA OR VOMITING 30 tablet 1  . pantoprazole (PROTONIX) 40 MG tablet TAKE 1 TABLET BY MOUTH TWICE (2) DAILY 180 tablet 1  . potassium chloride SA (K-DUR,KLOR-CON) 20 MEQ tablet TAKE 1 TABLET BY MOUTH ONCE A DAY 30 tablet 0  . predniSONE (DELTASONE) 5 MG tablet Take 5 mg by mouth daily with breakfast.    . vitamin C (ASCORBIC ACID) 500 MG tablet Take 500 mg by mouth daily.    Marland Kitchen zinc gluconate 50 MG tablet Take 50 mg daily by mouth.     Current Facility-Administered Medications  Medication Dose Route Frequency Provider Last Rate Last Dose  . Tbo-Filgrastim (GRANIX) injection 300 mcg  300 mcg Subcutaneous Once Lequita Asal, MD       Facility-Administered Medications Ordered in Other Visits  Medication Dose Route Frequency Provider Last Rate Last Dose  . heparin lock flush 100 unit/mL  500 Units Intravenous Once Corcoran, Melissa C, MD      . magnesium sulfate  IVPB 4 g 100 mL  4 g Intravenous Once Faythe Casa E, NP      . sodium chloride 0.9 % injection 10 mL  10 mL Intracatheter PRN Corcoran, Melissa C, MD      . sodium chloride 0.9 % injection 10 mL  10 mL Intravenous PRN Lequita Asal, MD   10 mL at 04/13/15 0852  . sodium chloride flush (NS) 0.9 % injection 10 mL  10 mL Intravenous PRN Lequita Asal, MD   10 mL at 03/27/17 [phone]  Review of Systems:  GENERAL:  Feels "great".  No fever, chills or sweats.  Weight up 1 pound. PERFORMANCE STATUS (ECOG):  1 HEENT:  No visual changes, sore throat, mouth sores or tenderness. Lungs:  No shortness of breath.  No cough.  No hemoptysis. Cardiac:  No chest pain, palpitations, orthopnea, or PND. GI:  Appetite, improved.  No nausea, vomiting, diarrhea, melena or hematochezia. GU:  No urgency, frequency, dysuria, or hematuria. Musculoskeletal:  Back pain.  Severe rheumatoid arthritis (on prednisone).  Osteoporosis.  No back pain. No muscle tenderness. Extremities:  No pain or swelling. Skin:  No increased bruising or bleeding.  No rashes or skin changes. Neuro:  Neuropathy (slightly worse- see HPI).  No headache, numbness or weakness, balance or coordination issues. Endocrine:  Diabetes on an insulin pump.  Blood sugar elevated on steroids.  Occasional hot flashes.  Psych:  No mood changes, depression or anxiety. Pain:  No pain. Review of systems:  All other systems reviewed and found to be negative.  Physical Exam:  Blood pressure 103/64, pulse 82, temperature (!) 97 F (36.1 C), temperature source Tympanic, resp. rate 20, weight 137 lb 2 oz (62.2 kg). GENERAL:  Well developed, well nourished woman sitting comfortably in the exam room in no acute distress.  She has a cane at her side. MENTAL STATUS:  Alert and oriented to person, place and time. HEAD:  Wearing a cap.  Alopecia.  Normocephalic, atraumatic, face symmetric, no Cushingoid features. EYES:  Gold rimmed glasses.  Blue eyes.  No  conjunctivitis or scleral icterus.  ENT: Oropharynx clear without lesion. Tongue normal. Mucous membranes moist.  RESPIRATORY: Decreased breath sounds right base.  No wheezes or rhonchi. CARDIOVASCULAR: Regular rate and rhythm without murmur, rub or gallop. ABDOMEN: Soft, , non-tender with active bowel sounds and no appreciable hepatosplenomegaly. No palpable nodularity or masses.  Insulin pump.  Ostomy bag. SKIN: No rashes or ulcers. EXTREMITIES:  No edema, skin discoloration or tenderness. No palpable cords. LYMPH NODES: No palpable cervical, supraclavicular, axillary or inguinal adenopathy  NEUROLOGICAL: Appropriate. PSYCH: Appropriate.   Radiology studies: 02/13/2015:  Abdomen and pelvic CT revealed bilateral mass-like adnexal regions (right adnexal mass 5.6 x 5.0 cm and the left adnexa mass 10.3 x 6.0 x 9.9 cm).  There was a large amount of soft tissue throughout the peritoneal cavity involving the omentum and other peritoneal surfaces.  There was a small volume ascites. There was a peripheral 3.3 x 1.9 cm low-attenuation lesion overlying the right lobe of the liver, likely representing a serosal implant. There was 1.4 x 2.2 cm ill-defined peripheral lesion within the inferior aspect of segment 6 adjacent to the ampulla in the duodenum.  04/28/2015:  Abdominal and pelvic CT revealed decreasing bilateral ovarian masses.   The left adnexal mass measured 4.0 x 6.1 cm (previously 5.0 x 8.5 cm). The right ovary measured 3.8 x 4.9 cm (previously 4.7 x 5.6 cm).  There was improved peritoneal carcinomatosis.  There was a small amount of ascites.  The hepatic dome lesion was stable. The previously seen right hepatic lobe lesion was not well-visualized.  There was a small left pleural effusion and trace right pleural effusion.  The nodular lesion at the ampulla of Vater, extending into the duodenum was stable.  There was no biliary ductal dilatation. 08/01/2015:  Rght upper extremity ultrasound  revealed a near occlusive thrombus within the central portion of the right internal jugular vein and central portion of the right subclavian vein. 09/01/2015:  Chest,  abdomen, and pelvic CT revealed continued decrease in perihepatic fluid collection contiguous with the right pleural space with percutaneous drain.  09/11/2015:  Chest, abdomen, and pelvic CT revealed a residual versus recurrent fluid collection posterior to the right hepatic lobe (2.6 x 1.1 x 4.0 cm).   10/13/2015:  Chest, abdomen, and pelvic CT revealed resolution of the empyema. 02/15/2016:  Chest, abdomen, and pelvic CT revealed multiple soft tissue nodules throughout the pelvis, small bowel mesenteric and peritoneum and, concerning for peritoneal metastatic disease.  There was soft tissue irregularity within the left upper quadrant.  There was mild right hydronephrosis (etiology unclear).  There was interval resolution of previously described right pleural based fluid and gas collection. There was a pleural based nodule within the right lower hemi-thorax concerning for pleural based metastasis.  There were small bilateral pleural effusions.  There was pulmonary nodularity, predominately within the left upper lobe (metastatic or infectious/inflammatory etiology).  There was slightly increased mediastinal adenopathy (infectious/inflammatory or metastatic).  There was a low attenuation lesion within the left hepatic lobe (complicated fluid within the fissure or metastatic disease). 03/08/2016:  Abdomen and pelvic CT revealed mild progression of peritoneal disease in the abdomen.  There was interval increase in loculated fluid around the lateral segment left liver, stomach, and spleen.  There was persistent soft tissue lesion at the level of the ampulla with mild intra and extrahepatic biliary duct dilatation.  There was persistent intrahepatic and capsular metastatic disease involving the liver. 05/26/2016:  Head MRI revealed multiple small  areas of acute infarct involving the occipital parietal lobe bilaterally and the left lateral cerebellum,  consistent with posterior circulation emboli.  06/10/2016:  Adomen and pelvic CT revealed progression of peritoneal carcinomatosis predominantly in the perihepatic space, bilateral lower quadrants and pelvis.  There was worsening bilateral obstructive uropathy due to malignant involvement of the pelvic ureters.  There was stable small pleural/subpleural nodules at the right lung base.  There was stable small left pleural effusion.  There was decreased small volume perihepatic ascites.  08/09/2016:  Abdomen and pelvic CT revealed interval increase and loculated fluid collections within the peritoneal space consistent with progression of peritoneal ovarian carcinoma metastasis.  There was one large 7 cm collection in the RIGHT lower quadrant which had increased significantly in size and may represent of point of small bowel obstruction.  The proximal stomach and duodenum were decompressed. The mid small bowel was mildly dilated. There was concern for obstruction given the large intraperitoneal fluid collection.  There was increase in subcapsular hepatic metastatic fluid collections.  There was enlargement of rounded ampullary lesion in the second portion duodenum concerning for metastatic lesion.  There was mild biliary duct dilatation (stable).  There was nodular pleural metastasis in the RIGHT lower lobe pleural space. 11/03/2016:  Abdomen and pelvic CT revealed multifocal cystic metastases in the abdomen/pelvis, reflecting peritoneal disease.  The dominant lesion in the left anterior abdomen has progressed, while additional lesions were mixed.  Peritoneal disease along the liver and spleen progressed.  Pleural-based metastases along the posterior right hemithorax progressed. There were trace left pleural effusion.  There was a stable 1.7 cm duodenal lesion at the ampulla. 11/29/2016:  Abdomen and pelvic CT  revealed mild interval enlargement of pleural metastasis the RIGHT lower lobe.  There was overall mild decrease in cystic peritoneal metastasis within the abdomen pelvis. The solid lesion at the terminal ileum was decreased in size. Subcapsular lesion in the liver was slightly decreased in size.  There was  no evidence of disease progression the abdomen pelvis.  There was mild intrahepatic duct dilatation similar prior.  The ampullary lesion was slightly larger.  There was mild hydronephrosis and hydroureter on the LEFT likely related to cystic metastasis at the LEFT vesicoureteral junction 12/09/2016:  MRCP revealed the cystic lesion within the pancreatic head/uncinate process had decreased in size and demonstrated no suspicious characteristics. This can be presumed a pseudocyst or focus of side branch duct ectasia.  There was similar to slight progression of extensive peritoneal metastasis since 11/29/2016. Grossly similar abdominal nodal and right pleural metastasis.  There was chronic mild common duct dilatation with similar soft tissue fullness about the ampulla compared to 11/29/2016.  There was improved to resolved left-sided hydronephrosis. 01/20/2017:  Abdomen and pelvic CT revealed complex mixed interval changes in widespread metastatic disease in the peritoneal cavity, lower chest and thoracic, abdominal and pelvic lymph nodes. Right lower chest pleural metastasis and several of the peritoneal tumor implants had increased.  There was new right inguinal lymphadenopathy.  Some of the peritoneal tumor implants had decreased.  The small dependent left pleural effusion was slightly increased .  The mild left hydroureteronephrosis was improved.  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   The chronic biliary ductal dilatation was stable .  There was no evidence of bowel obstruction.  Admissions: [place] from 06/15/2015 - 06/22/2015.  She underwent exploratory laparotomy, lysis of adhesions, total abdominal  hysterectomy with bilateral salpingo-oophorectomy, infracolic omentectomy, optimal tumor debulking(< 1 cm), recto-sigmoid resection with creation of end colostomy, cholecystectomy, mobilization of splenic flexure and liver with diaphragmatic stripping on 06/15/2015. The right diaphragm was cleared of tumor. During dissection, the diaphragm was entered and closed with sutures.  [place] from 07/01/2015 - 07/03/2015 with pleural effusion. [place] from 07/07/2015 - 07/09/2015 with recurrent pleural effusion. [place] from 07/29/2015 - 08/10/2015 with a right-sided empyema and liver abscess. She underwent CT-guided placement of a liver abscess drain on 07/30/2015.  Liver abscess culture grew out group B strep and Enterobacter which was sensitive to Zosyn. She was transitioned to ertapenem Colbert Ewing) prior to discharge.  She was readmitted to [facility] on 09/17/2015. 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  Head MRI on 05/26/2016 revealed multiple small areas of acute infarct involving the occipital parietal lobe bilaterally and the left lateral cerebellum,  consistent with posterior circulation emboli.  Work-up included a negative carotid ultrasound and echocardiogram. [facility] from 08/10/2016 - 08/14/2016 with a small bowel obstruction.  She was managed conservatively. [place] from 09/27/2016 - 09/28/2016 with symptomatic anemia and diarrhea.   Infusion on 03/27/2017  Component Date Value Ref Range Status  . Magnesium 03/27/2017 1.2* 1.7 - 2.4 mg/dL Final  . Sodium 03/27/2017 132* 135 - 145 mmol/L Final  . Potassium 03/27/2017 3.6  3.5 - 5.1 mmol/L Final  . Chloride 03/27/2017 97* 101 - 111 mmol/L Final  . CO2 03/27/2017 27  22 - 32 mmol/L Final  . Glucose, Bld 03/27/2017 293* 65 - 99 mg/dL Final  . BUN 03/27/2017 17  6 - 20 mg/dL Final  . Creatinine, Ser 03/27/2017 0.71  0.44 - 1.00 mg/dL Final  . Calcium 03/27/2017 8.6* 8.9 - 10.3 mg/dL Final  . Total Protein 03/27/2017  6.5  6.5 - 8.1 g/dL Final  . Albumin 03/27/2017 2.7* 3.5 - 5.0 g/dL Final  . AST 03/27/2017 24  15 - 41 U/L Final  . ALT 03/27/2017 12* 14 - 54 U/L Final  . Alkaline Phosphatase 03/27/2017  81  38 - 126 U/L Final  . Total Bilirubin 03/27/2017 0.6  0.3 - 1.2 mg/dL Final  . GFR calc non Af Amer 03/27/2017 >60  >60 mL/min Final  . GFR calc Af Amer 03/27/2017 >60  >60 mL/min Final   Comment: (NOTE) The eGFR has been calculated using the CKD EPI equation. This calculation has not been validated in all clinical situations. eGFR's persistently <60 mL/min signify possible Chronic Kidney Disease.   . Anion gap 03/27/2017 8  5 - 15 Final  . WBC 03/27/2017 3.2* 3.6 - 11.0 K/uL Final  . RBC 03/27/2017 3.45* 3.80 - 5.20 MIL/uL Final  . Hemoglobin 03/27/2017 9.7* 12.0 - 16.0 g/dL Final  . HCT 03/27/2017 28.9* 35.0 - 47.0 % Final  . MCV 03/27/2017 83.6  80.0 - 100.0 fL Final  . MCH 03/27/2017 28.1  26.0 - 34.0 pg Final  . MCHC 03/27/2017 33.6  32.0 - 36.0 g/dL Final  . RDW 03/27/2017 16.5* 11.5 - 14.5 % Final  . Platelets 03/27/2017 262  150 - 440 K/uL Final  . Neutrophils Relative % 03/27/2017 68  % Final  . Neutro Abs 03/27/2017 2.1  1.4 - 6.5 K/uL Final  . Lymphocytes Relative 03/27/2017 18  % Final  . Lymphs Abs 03/27/2017 0.6* 1.0 - 3.6 K/uL Final  . Monocytes Relative 03/27/2017 13  % Final  . Monocytes Absolute 03/27/2017 0.4  0.2 - 0.9 K/uL Final  . Eosinophils Relative 03/27/2017 0  % Final  . Eosinophils Absolute 03/27/2017 0.0  0 - 0.7 K/uL Final  . Basophils Relative 03/27/2017 1  % Final  . Basophils Absolute 03/27/2017 0.0  0 - 0.1 K/uL Final    Assessment:  UCHECHI DENISON is a 71 y.o. female with progressive stage IV ovarian cancer.  She presented with abdominal discomfort and bloating.  Omental biopsy on 02/23/2015 revealed metastatic high grade serous carcinoma, consistent with gynecologic origin.   She was initially diagnosed with clinical stage IIIC (T3cN1Mx).  CA125 was 707  on 02/17/2015.  She received 4 cycles of neoadjuvant carboplatin and Taxol (03/05/2015 - 05/22/2015).  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  She had loose stools on oral magnesium.  She was initially on Neurontin then switched Lyrica with cycle #3.  Cycle #4 was notable for neutropenia (ANC 300) requiring GCSF x 3 days.    She underwent exploratory laparotomy, lysis of adhesions, total abdominal hysterectomy with bilateral salpingo-oophorectomy, infracolic omentectomy, optimal tumor debulking(< 1 cm), recto-sigmoid resection with creation of end colostomy, cholecystectomy, mobilization of splenic flexure and liver with diaphragmatic stripping on 06/15/2015. The right diaphragm was cleared of tumor. During dissection, the diaphragm was entered and closed with sutures.  She received tamoxifen from 02/25/2016 - 03/14/2016.  She received 4 cycles of carboplatin and gemcitabine (03/21/2016 - 05/24/2016) with GCSF/Neulasta support.  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  Cymbalta began on 02/24/2017  PDL-1 testing revealed a combined score was 5 (>=1 positive).     [facility] genetic testing negative for APC, ATM, BMPR1A, BRCA1, BRCA2, BRIP1, CDH1, CDK4, CDKN2A, CHEK2, EPCAM, MLH1, MSH2, MSH6, MUTYH, NBN, PALB2, PMS2, PTEN, RAD51C, RAD51D, SMAD4, STK11, TP53. Sequencing for select regions of POLE and POLD1, and large rearrangement analysis for select regions of GREM1 were negative.  She received 2 cycles of Doxil (06/21/2016 - 07/19/2016) with Neulasta support.  She received 4 cycles of topotecan with Neulasta support (08/22/2016 - 10/10/2016).    She received 3 cycles of pembrolizumab (  Keytruda) from 12/06/2016 -  01/09/2017.  Cycle #1 was complicated by neutropenia (ANC 400) requiring GCSF/Granix x 3 days.  She received GCSF x 5 days with cycle #2.  Abdomen and pelvic CT  on 01/20/2017 revealed complex mixed interval changes in widespread metastatic disease in the  peritoneal cavity, lower chest and thoracic, abdominal and pelvic lymph nodes. Right lower chest pleural metastasis and several of the peritoneal tumor implants had increased.  There was new right inguinal lymphadenopathy.  Some of the peritoneal tumor implants had decreased.  The small dependent left pleural effusion was slightly increased .  The mild left hydroureteronephrosis was improved.  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   The chronic biliary ductal dilatation was stable .  There was no evidence of bowel obstruction.  CA125 was 802.9 on 03/30/2015, 567.9 on 04/13/2015, 168.8 on 05/15/2015, 85.2 on 07/17/2015, 68.6 on 07/28/2015, 34.5 on 09/17/2015, 20.7 on 11/06/2015, 49 on 01/22/2016, 106.1 on 02/22/2016, 138.8 on 03/07/2016, 220.8 on 03/21/2016, 152.8 on 04/11/2016, 125.6 on 04/18/2016, 76.8 on 05/03/2016, 60.2 on 05/24/2016, 44 on 07/19/2016, 65.8 on 08/17/2016, 53.4 on 09/12/2016, 47.3 on 10/10/2016, 91 on 12/20/2016, 141.1 on 01/09/2017, 463.7 on 02/27/2017, and 907.1 on 03/27/2017.  She has a history of recurrent right sided pleural effusion.  She underwent thoracentesis of 650 cc on post-operative day 3.  She was admitted to [facility] on 07/01/2015 and 07/07/2015 for recurrent shortness of breath.  She underwent 2 additional thoracenteses (1.1 L on 07/02/2015 and 850 cc on 07/08/2015).  Cytology was negative x 2.  Bilateral lower extremity duplex on 07/03/2015 was negative.  Echo revealed an EF of 55-60% on 07/08/2015 and 60-65% on 05/27/2016.    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 She was on Lovenox 60 mg twice a day.  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  She has severe rheumatoid arthritis.  Methotrexate and Enbrel were initially on hold.  She has a normocytic anemia.  Work-up on 02/17/2015  and 05/24/2016 revealed a normal ferritin, B12, folate, TSH.  She denies any melena or hematochezia.    She has anemia due to chronic disease. She received 1 unit PRBCs during her admission at [facility]. She denies any melena or hematochezia. She has diabetes and is on an insulin pump.  She has had persistent neutropenia felt secondary to her rheumatoid arthritis.  Folate and MMA were normal.  TSH was 6.13 (high) with a free T4 of 1.17 (0.61-1.12).  She began methotrexate (10 mg a week) and prednisone (5 mg a day) for severe rheumatoid arthritis on 12/17/2015.  She remains on prednisone alone (5 mg a day).  Bone marrow aspirate and biopsy on 06/09/2010 revealed a hypercellular marrow (70%) with no evidence of dysplasia or malignancy.  Flow cytometry was negative.  Cytogenetics were normal (46,XX).  FISH studies were negative for MDS.  Bone marrow aspirate and biopsy on 12/03/2015 revealed a normocellular to mildy hypercellular marrow for age (40%) with left shifted myelopoiesis, non specific dyserythropoiesis and mild megakaryocytic atypia with no increase in blasts.  There were multiple small nonspecific lymphoid aggregates (favor reactive). There was no increase in reticulin.  There was decreased myeloid cells (37%) with left shifted maturation and 1% atypical myelod blasts.  There was relatively increased monocytic cells (11%), relatively increased lymphoid cells (36%), and relatively increased eosinophils (6%).  Cytogenetics were normal (57, XX).  SNP microarray was normal.  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  She receives IV magnesium weekly.  She has chemotherapy induced anemia.  She has received Procrit (last 11/07/2016).  Labs on 08/22/2016 revealed a normal B12 and folate.  Ferritin was 37, iron saturation 11% and TIBC 281.  Code status is DNR/DNI.  She developed acute LFT elevation on 11/28/2016.  Event was preceded by upper abdominal discomfort.  Hepatitis B core antibody  total and hepatitis C antibody were negative.  She is s/p week 1 of cycle #1 Abraxane (03/13/2017).  Cycle #1 was truncated after day 1 secondary to side effects and Thanksgiving.  Symptomatically, she feels "good".  She has a grade II neuropathy.  Exam is stable.  WBC is 3200 with an [place] of 2100. Hemoglobin is 9.7, hematocrit 28.9, and platelets 262,000.  Magnesium is 1.2 (low). Sodium is low at 132. Glucose is 293.  Plan: 1.  Labs today:  CBC with diff, CMP, Mg, CA125. 2.  Week 1 of cycle #2 Abraxane today. 3.  Magnesium low at 1.2. Will replace with 6 grams of intravenous magnesium. 4.  RTC on 11/27, 11/28, and 11/29 for GCSF 300 mcg SQ 5.  RTC  04/03/2017 for MD assessment, labs (CBC with diff, CMP, Mg), and day 8 of cycle #2 Abraxane, and +/- IV Mg.  6.  RTC on 12/04, 12/05, and 12/06 for GCSF 300 mcg SQ   Honor Loh, NP  03/27/2017, 9:23 AM   I saw and evaluated the patient, participating in the key portions of the service and reviewing pertinent diagnostic studies and records.  I reviewed the nurse practitioner's note and agree with the findings and the plan.  The assessment and plan were discussed with the patient.  Several questions were asked by the patient and answered.   Nolon Stalls, MD 03/27/2017,9:23 AM

## 2017-03-27 ENCOUNTER — Inpatient Hospital Stay: Payer: Medicare Other | Admitting: Hematology and Oncology

## 2017-03-27 ENCOUNTER — Inpatient Hospital Stay: Payer: Medicare Other

## 2017-03-27 ENCOUNTER — Encounter: Payer: Self-pay | Admitting: Hematology and Oncology

## 2017-03-27 ENCOUNTER — Other Ambulatory Visit: Payer: Self-pay | Admitting: *Deleted

## 2017-03-27 ENCOUNTER — Other Ambulatory Visit: Payer: Self-pay | Admitting: Hematology and Oncology

## 2017-03-27 VITALS — BP 103/64 | HR 82 | Temp 97.0°F | Resp 20 | Wt 137.1 lb

## 2017-03-27 DIAGNOSIS — Z79899 Other long term (current) drug therapy: Secondary | ICD-10-CM

## 2017-03-27 DIAGNOSIS — Z87442 Personal history of urinary calculi: Secondary | ICD-10-CM

## 2017-03-27 DIAGNOSIS — C561 Malignant neoplasm of right ovary: Secondary | ICD-10-CM | POA: Diagnosis not present

## 2017-03-27 DIAGNOSIS — Z90710 Acquired absence of both cervix and uterus: Secondary | ICD-10-CM

## 2017-03-27 DIAGNOSIS — N1330 Unspecified hydronephrosis: Secondary | ICD-10-CM

## 2017-03-27 DIAGNOSIS — R531 Weakness: Secondary | ICD-10-CM

## 2017-03-27 DIAGNOSIS — C569 Malignant neoplasm of unspecified ovary: Secondary | ICD-10-CM

## 2017-03-27 DIAGNOSIS — Z87891 Personal history of nicotine dependence: Secondary | ICD-10-CM

## 2017-03-27 DIAGNOSIS — D638 Anemia in other chronic diseases classified elsewhere: Secondary | ICD-10-CM | POA: Diagnosis not present

## 2017-03-27 DIAGNOSIS — R112 Nausea with vomiting, unspecified: Secondary | ICD-10-CM | POA: Diagnosis not present

## 2017-03-27 DIAGNOSIS — R0602 Shortness of breath: Secondary | ICD-10-CM

## 2017-03-27 DIAGNOSIS — R630 Anorexia: Secondary | ICD-10-CM | POA: Diagnosis not present

## 2017-03-27 DIAGNOSIS — C786 Secondary malignant neoplasm of retroperitoneum and peritoneum: Secondary | ICD-10-CM | POA: Diagnosis not present

## 2017-03-27 DIAGNOSIS — E860 Dehydration: Secondary | ICD-10-CM | POA: Diagnosis not present

## 2017-03-27 DIAGNOSIS — D709 Neutropenia, unspecified: Secondary | ICD-10-CM

## 2017-03-27 DIAGNOSIS — E876 Hypokalemia: Secondary | ICD-10-CM

## 2017-03-27 DIAGNOSIS — E108 Type 1 diabetes mellitus with unspecified complications: Secondary | ICD-10-CM

## 2017-03-27 DIAGNOSIS — G629 Polyneuropathy, unspecified: Secondary | ICD-10-CM

## 2017-03-27 DIAGNOSIS — E871 Hypo-osmolality and hyponatremia: Secondary | ICD-10-CM | POA: Diagnosis not present

## 2017-03-27 DIAGNOSIS — M549 Dorsalgia, unspecified: Secondary | ICD-10-CM

## 2017-03-27 DIAGNOSIS — D6481 Anemia due to antineoplastic chemotherapy: Secondary | ICD-10-CM | POA: Diagnosis not present

## 2017-03-27 DIAGNOSIS — R5383 Other fatigue: Secondary | ICD-10-CM

## 2017-03-27 DIAGNOSIS — C801 Malignant (primary) neoplasm, unspecified: Secondary | ICD-10-CM

## 2017-03-27 DIAGNOSIS — Z801 Family history of malignant neoplasm of trachea, bronchus and lung: Secondary | ICD-10-CM

## 2017-03-27 DIAGNOSIS — I2510 Atherosclerotic heart disease of native coronary artery without angina pectoris: Secondary | ICD-10-CM

## 2017-03-27 DIAGNOSIS — R197 Diarrhea, unspecified: Secondary | ICD-10-CM

## 2017-03-27 DIAGNOSIS — T451X5A Adverse effect of antineoplastic and immunosuppressive drugs, initial encounter: Secondary | ICD-10-CM

## 2017-03-27 DIAGNOSIS — K219 Gastro-esophageal reflux disease without esophagitis: Secondary | ICD-10-CM

## 2017-03-27 DIAGNOSIS — Z9641 Presence of insulin pump (external) (internal): Secondary | ICD-10-CM

## 2017-03-27 DIAGNOSIS — M069 Rheumatoid arthritis, unspecified: Secondary | ICD-10-CM

## 2017-03-27 DIAGNOSIS — I10 Essential (primary) hypertension: Secondary | ICD-10-CM

## 2017-03-27 DIAGNOSIS — I509 Heart failure, unspecified: Secondary | ICD-10-CM

## 2017-03-27 DIAGNOSIS — C800 Disseminated malignant neoplasm, unspecified: Principal | ICD-10-CM

## 2017-03-27 DIAGNOSIS — Z90722 Acquired absence of ovaries, bilateral: Secondary | ICD-10-CM

## 2017-03-27 DIAGNOSIS — K5900 Constipation, unspecified: Secondary | ICD-10-CM

## 2017-03-27 DIAGNOSIS — G620 Drug-induced polyneuropathy: Secondary | ICD-10-CM

## 2017-03-27 DIAGNOSIS — Z8701 Personal history of pneumonia (recurrent): Secondary | ICD-10-CM

## 2017-03-27 DIAGNOSIS — Z5111 Encounter for antineoplastic chemotherapy: Secondary | ICD-10-CM

## 2017-03-27 DIAGNOSIS — Z7189 Other specified counseling: Secondary | ICD-10-CM

## 2017-03-27 DIAGNOSIS — D72819 Decreased white blood cell count, unspecified: Secondary | ICD-10-CM

## 2017-03-27 DIAGNOSIS — I252 Old myocardial infarction: Secondary | ICD-10-CM

## 2017-03-27 DIAGNOSIS — K449 Diaphragmatic hernia without obstruction or gangrene: Secondary | ICD-10-CM

## 2017-03-27 LAB — CBC WITH DIFFERENTIAL/PLATELET
Basophils Absolute: 0 10*3/uL (ref 0–0.1)
Basophils Relative: 1 %
Eosinophils Absolute: 0 10*3/uL (ref 0–0.7)
Eosinophils Relative: 0 %
HCT: 28.9 % — ABNORMAL LOW (ref 35.0–47.0)
Hemoglobin: 9.7 g/dL — ABNORMAL LOW (ref 12.0–16.0)
Lymphocytes Relative: 18 %
Lymphs Abs: 0.6 10*3/uL — ABNORMAL LOW (ref 1.0–3.6)
MCH: 28.1 pg (ref 26.0–34.0)
MCHC: 33.6 g/dL (ref 32.0–36.0)
MCV: 83.6 fL (ref 80.0–100.0)
Monocytes Absolute: 0.4 10*3/uL (ref 0.2–0.9)
Monocytes Relative: 13 %
Neutro Abs: 2.1 10*3/uL (ref 1.4–6.5)
Neutrophils Relative %: 68 %
Platelets: 262 10*3/uL (ref 150–440)
RBC: 3.45 MIL/uL — ABNORMAL LOW (ref 3.80–5.20)
RDW: 16.5 % — ABNORMAL HIGH (ref 11.5–14.5)
WBC: 3.2 10*3/uL — ABNORMAL LOW (ref 3.6–11.0)

## 2017-03-27 LAB — COMPREHENSIVE METABOLIC PANEL
ALT: 12 U/L — ABNORMAL LOW (ref 14–54)
AST: 24 U/L (ref 15–41)
Albumin: 2.7 g/dL — ABNORMAL LOW (ref 3.5–5.0)
Alkaline Phosphatase: 81 U/L (ref 38–126)
Anion gap: 8 (ref 5–15)
BUN: 17 mg/dL (ref 6–20)
CO2: 27 mmol/L (ref 22–32)
Calcium: 8.6 mg/dL — ABNORMAL LOW (ref 8.9–10.3)
Chloride: 97 mmol/L — ABNORMAL LOW (ref 101–111)
Creatinine, Ser: 0.71 mg/dL (ref 0.44–1.00)
GFR calc Af Amer: >60 mL/min (ref >60)
GFR calc non Af Amer: >60 mL/min (ref >60)
Glucose, Bld: 293 mg/dL — ABNORMAL HIGH (ref 65–99)
Potassium: 3.6 mmol/L (ref 3.5–5.1)
Sodium: 132 mmol/L — ABNORMAL LOW (ref 135–145)
Total Bilirubin: 0.6 mg/dL (ref 0.3–1.2)
Total Protein: 6.5 g/dL (ref 6.5–8.1)

## 2017-03-27 LAB — MAGNESIUM: Magnesium: 1.2 mg/dL — ABNORMAL LOW (ref 1.7–2.4)

## 2017-03-27 MED ORDER — SODIUM CHLORIDE 0.9 % IV SOLN
6.0000 g | Freq: Once | INTRAVENOUS | Status: AC
  Administered 2017-03-27: 6 g via INTRAVENOUS
  Filled 2017-03-27: qty 12

## 2017-03-27 MED ORDER — PACLITAXEL PROTEIN-BOUND CHEMO INJECTION 100 MG
80.0000 mg/m2 | Freq: Once | INTRAVENOUS | Status: AC
  Administered 2017-03-27: 125 mg via INTRAVENOUS
  Filled 2017-03-27: qty 25

## 2017-03-27 MED ORDER — ONDANSETRON HCL 4 MG PO TABS
8.0000 mg | ORAL_TABLET | Freq: Once | ORAL | Status: AC
  Administered 2017-03-27: 8 mg via ORAL
  Filled 2017-03-27: qty 2

## 2017-03-27 MED ORDER — SODIUM CHLORIDE 0.9% FLUSH
10.0000 mL | INTRAVENOUS | Status: DC | PRN
  Administered 2017-03-27: 10 mL via INTRAVENOUS
  Filled 2017-03-27: qty 10

## 2017-03-27 MED ORDER — PACLITAXEL PROTEIN-BOUND CHEMO INJECTION 100 MG
80.0000 mg/m2 | Freq: Once | Status: DC
  Filled 2017-03-27: qty 25

## 2017-03-27 MED ORDER — SODIUM CHLORIDE 0.9 % IV SOLN
Freq: Once | INTRAVENOUS | Status: AC
  Administered 2017-03-27: 10:00:00 via INTRAVENOUS
  Filled 2017-03-27: qty 1000

## 2017-03-27 MED ORDER — HEPARIN SOD (PORK) LOCK FLUSH 100 UNIT/ML IV SOLN
500.0000 [IU] | Freq: Once | INTRAVENOUS | Status: AC
  Administered 2017-03-27: 500 [IU] via INTRAVENOUS
  Filled 2017-03-27: qty 5

## 2017-03-27 MED ORDER — SODIUM CHLORIDE 0.9 % IV SOLN
Freq: Once | INTRAVENOUS | Status: AC
  Filled 2017-03-27: qty 1000

## 2017-03-27 NOTE — Progress Notes (Signed)
Pt in for follow up, denies any difficulties at this time.  Reports having a good Thanksgiving and had a very good appetite.

## 2017-03-28 ENCOUNTER — Other Ambulatory Visit: Payer: Self-pay | Admitting: Urgent Care

## 2017-03-28 ENCOUNTER — Inpatient Hospital Stay: Payer: Medicare Other

## 2017-03-28 DIAGNOSIS — C561 Malignant neoplasm of right ovary: Secondary | ICD-10-CM | POA: Diagnosis not present

## 2017-03-28 DIAGNOSIS — D509 Iron deficiency anemia, unspecified: Secondary | ICD-10-CM

## 2017-03-28 LAB — CA 125: Cancer Antigen (CA) 125: 907.1 U/mL — ABNORMAL HIGH (ref 0.0–38.1)

## 2017-03-28 MED ORDER — TBO-FILGRASTIM 300 MCG/0.5ML ~~LOC~~ SOSY
300.0000 ug | PREFILLED_SYRINGE | Freq: Once | SUBCUTANEOUS | Status: AC
  Administered 2017-03-28: 300 ug via SUBCUTANEOUS

## 2017-03-29 ENCOUNTER — Inpatient Hospital Stay: Payer: Medicare Other

## 2017-03-29 DIAGNOSIS — C561 Malignant neoplasm of right ovary: Secondary | ICD-10-CM | POA: Diagnosis not present

## 2017-03-29 MED ORDER — TBO-FILGRASTIM 300 MCG/0.5ML ~~LOC~~ SOSY
300.0000 ug | PREFILLED_SYRINGE | Freq: Once | SUBCUTANEOUS | Status: AC
  Administered 2017-03-29: 300 ug via SUBCUTANEOUS

## 2017-03-30 ENCOUNTER — Inpatient Hospital Stay: Payer: Medicare Other

## 2017-03-30 ENCOUNTER — Ambulatory Visit: Payer: Medicare Other

## 2017-03-30 DIAGNOSIS — C561 Malignant neoplasm of right ovary: Secondary | ICD-10-CM | POA: Diagnosis not present

## 2017-03-30 MED ORDER — TBO-FILGRASTIM 300 MCG/0.5ML ~~LOC~~ SOSY
300.0000 ug | PREFILLED_SYRINGE | Freq: Once | SUBCUTANEOUS | Status: AC
  Administered 2017-03-30: 300 ug via SUBCUTANEOUS

## 2017-03-31 ENCOUNTER — Other Ambulatory Visit: Payer: Self-pay | Admitting: Hematology and Oncology

## 2017-03-31 ENCOUNTER — Inpatient Hospital Stay: Payer: Medicare Other

## 2017-03-31 ENCOUNTER — Other Ambulatory Visit: Payer: Self-pay

## 2017-03-31 DIAGNOSIS — C561 Malignant neoplasm of right ovary: Secondary | ICD-10-CM | POA: Diagnosis not present

## 2017-03-31 DIAGNOSIS — D509 Iron deficiency anemia, unspecified: Secondary | ICD-10-CM

## 2017-03-31 LAB — CBC WITH DIFFERENTIAL/PLATELET
BASOS ABS: 0.1 10*3/uL (ref 0–0.1)
BASOS PCT: 1 %
EOS ABS: 0 10*3/uL (ref 0–0.7)
EOS PCT: 0 %
HCT: 29.1 % — ABNORMAL LOW (ref 35.0–47.0)
Hemoglobin: 9.6 g/dL — ABNORMAL LOW (ref 12.0–16.0)
LYMPHS PCT: 10 %
Lymphs Abs: 0.5 10*3/uL — ABNORMAL LOW (ref 1.0–3.6)
MCH: 28.1 pg (ref 26.0–34.0)
MCHC: 33 g/dL (ref 32.0–36.0)
MCV: 85.1 fL (ref 80.0–100.0)
MONO ABS: 0.6 10*3/uL (ref 0.2–0.9)
Monocytes Relative: 11 %
Neutro Abs: 4.3 10*3/uL (ref 1.4–6.5)
Neutrophils Relative %: 78 %
PLATELETS: 261 10*3/uL (ref 150–440)
RBC: 3.42 MIL/uL — ABNORMAL LOW (ref 3.80–5.20)
RDW: 17.3 % — AB (ref 11.5–14.5)
WBC: 5.5 10*3/uL (ref 3.6–11.0)

## 2017-03-31 LAB — MAGNESIUM: MAGNESIUM: 1.5 mg/dL — AB (ref 1.7–2.4)

## 2017-03-31 MED ORDER — HEPARIN SOD (PORK) LOCK FLUSH 100 UNIT/ML IV SOLN
500.0000 [IU] | Freq: Once | INTRAVENOUS | Status: AC | PRN
  Administered 2017-03-31: 500 [IU]
  Filled 2017-03-31: qty 5

## 2017-03-31 MED ORDER — SODIUM CHLORIDE 0.9 % IV SOLN
Freq: Once | INTRAVENOUS | Status: AC
  Administered 2017-03-31: 13:00:00 via INTRAVENOUS
  Filled 2017-03-31: qty 1000

## 2017-03-31 MED ORDER — MAGNESIUM SULFATE 4 GM/100ML IV SOLN
4.0000 g | Freq: Once | INTRAVENOUS | Status: AC
  Administered 2017-03-31: 4 g via INTRAVENOUS
  Filled 2017-03-31: qty 100

## 2017-03-31 MED ORDER — SODIUM CHLORIDE 0.9% FLUSH
10.0000 mL | Freq: Once | INTRAVENOUS | Status: AC | PRN
  Administered 2017-03-31: 10 mL
  Filled 2017-03-31: qty 10

## 2017-03-31 NOTE — Telephone Encounter (Signed)
Left message to return call, received refill request from [facility] for furosemide. Per visit at [facility] patient is not taking this, refill request is for 1 tablet bid, in chart it states 0.5 tablets bid. Need verification. Ok for [facility] to speak to patient

## 2017-04-03 ENCOUNTER — Telehealth: Payer: Self-pay | Admitting: *Deleted

## 2017-04-03 ENCOUNTER — Inpatient Hospital Stay: Payer: Medicare Other | Attending: Hematology and Oncology

## 2017-04-03 ENCOUNTER — Inpatient Hospital Stay: Payer: Medicare Other | Admitting: Hematology and Oncology

## 2017-04-03 ENCOUNTER — Inpatient Hospital Stay: Payer: Medicare Other

## 2017-04-03 VITALS — BP 124/67 | HR 94 | Temp 97.9°F | Resp 20 | Wt 133.4 lb

## 2017-04-03 DIAGNOSIS — N1330 Unspecified hydronephrosis: Secondary | ICD-10-CM | POA: Diagnosis not present

## 2017-04-03 DIAGNOSIS — Z5111 Encounter for antineoplastic chemotherapy: Secondary | ICD-10-CM | POA: Insufficient documentation

## 2017-04-03 DIAGNOSIS — Z87442 Personal history of urinary calculi: Secondary | ICD-10-CM | POA: Diagnosis not present

## 2017-04-03 DIAGNOSIS — M069 Rheumatoid arthritis, unspecified: Secondary | ICD-10-CM

## 2017-04-03 DIAGNOSIS — C561 Malignant neoplasm of right ovary: Secondary | ICD-10-CM

## 2017-04-03 DIAGNOSIS — Z7984 Long term (current) use of oral hypoglycemic drugs: Secondary | ICD-10-CM | POA: Diagnosis not present

## 2017-04-03 DIAGNOSIS — E109 Type 1 diabetes mellitus without complications: Secondary | ICD-10-CM | POA: Diagnosis not present

## 2017-04-03 DIAGNOSIS — E785 Hyperlipidemia, unspecified: Secondary | ICD-10-CM

## 2017-04-03 DIAGNOSIS — K219 Gastro-esophageal reflux disease without esophagitis: Secondary | ICD-10-CM | POA: Insufficient documentation

## 2017-04-03 DIAGNOSIS — T451X5A Adverse effect of antineoplastic and immunosuppressive drugs, initial encounter: Secondary | ICD-10-CM

## 2017-04-03 DIAGNOSIS — Z66 Do not resuscitate: Secondary | ICD-10-CM | POA: Insufficient documentation

## 2017-04-03 DIAGNOSIS — G620 Drug-induced polyneuropathy: Secondary | ICD-10-CM

## 2017-04-03 DIAGNOSIS — Z79899 Other long term (current) drug therapy: Secondary | ICD-10-CM

## 2017-04-03 DIAGNOSIS — E876 Hypokalemia: Secondary | ICD-10-CM | POA: Diagnosis not present

## 2017-04-03 DIAGNOSIS — D6481 Anemia due to antineoplastic chemotherapy: Secondary | ICD-10-CM

## 2017-04-03 DIAGNOSIS — I252 Old myocardial infarction: Secondary | ICD-10-CM | POA: Diagnosis not present

## 2017-04-03 DIAGNOSIS — R634 Abnormal weight loss: Secondary | ICD-10-CM | POA: Insufficient documentation

## 2017-04-03 DIAGNOSIS — K449 Diaphragmatic hernia without obstruction or gangrene: Secondary | ICD-10-CM | POA: Insufficient documentation

## 2017-04-03 DIAGNOSIS — I998 Other disorder of circulatory system: Secondary | ICD-10-CM

## 2017-04-03 DIAGNOSIS — D61818 Other pancytopenia: Secondary | ICD-10-CM | POA: Diagnosis not present

## 2017-04-03 DIAGNOSIS — Z7689 Persons encountering health services in other specified circumstances: Secondary | ICD-10-CM

## 2017-04-03 DIAGNOSIS — Z9641 Presence of insulin pump (external) (internal): Secondary | ICD-10-CM

## 2017-04-03 DIAGNOSIS — C800 Disseminated malignant neoplasm, unspecified: Principal | ICD-10-CM

## 2017-04-03 DIAGNOSIS — G629 Polyneuropathy, unspecified: Secondary | ICD-10-CM | POA: Diagnosis not present

## 2017-04-03 DIAGNOSIS — C786 Secondary malignant neoplasm of retroperitoneum and peritoneum: Secondary | ICD-10-CM

## 2017-04-03 DIAGNOSIS — C569 Malignant neoplasm of unspecified ovary: Secondary | ICD-10-CM

## 2017-04-03 DIAGNOSIS — C801 Malignant (primary) neoplasm, unspecified: Secondary | ICD-10-CM

## 2017-04-03 DIAGNOSIS — I10 Essential (primary) hypertension: Secondary | ICD-10-CM | POA: Insufficient documentation

## 2017-04-03 DIAGNOSIS — Z794 Long term (current) use of insulin: Secondary | ICD-10-CM

## 2017-04-03 DIAGNOSIS — I509 Heart failure, unspecified: Secondary | ICD-10-CM

## 2017-04-03 DIAGNOSIS — I2510 Atherosclerotic heart disease of native coronary artery without angina pectoris: Secondary | ICD-10-CM

## 2017-04-03 DIAGNOSIS — Z7189 Other specified counseling: Secondary | ICD-10-CM

## 2017-04-03 LAB — COMPREHENSIVE METABOLIC PANEL
ALT: 11 U/L — ABNORMAL LOW (ref 14–54)
AST: 20 U/L (ref 15–41)
Albumin: 2.8 g/dL — ABNORMAL LOW (ref 3.5–5.0)
Alkaline Phosphatase: 94 U/L (ref 38–126)
Anion gap: 8 (ref 5–15)
BUN: 18 mg/dL (ref 6–20)
CO2: 26 mmol/L (ref 22–32)
Calcium: 8.9 mg/dL (ref 8.9–10.3)
Chloride: 97 mmol/L — ABNORMAL LOW (ref 101–111)
Creatinine, Ser: 0.77 mg/dL (ref 0.44–1.00)
GFR calc Af Amer: >60 mL/min (ref >60)
GFR calc non Af Amer: >60 mL/min (ref >60)
Glucose, Bld: 342 mg/dL — ABNORMAL HIGH (ref 65–99)
Potassium: 4.1 mmol/L (ref 3.5–5.1)
Sodium: 131 mmol/L — ABNORMAL LOW (ref 135–145)
Total Bilirubin: 0.5 mg/dL (ref 0.3–1.2)
Total Protein: 6.6 g/dL (ref 6.5–8.1)

## 2017-04-03 LAB — CBC WITH DIFFERENTIAL/PLATELET
Basophils Absolute: 0 10*3/uL (ref 0–0.1)
Basophils Relative: 1 %
Eosinophils Absolute: 0 10*3/uL (ref 0–0.7)
Eosinophils Relative: 0 %
HCT: 29.3 % — ABNORMAL LOW (ref 35.0–47.0)
Hemoglobin: 9.7 g/dL — ABNORMAL LOW (ref 12.0–16.0)
Lymphocytes Relative: 14 %
Lymphs Abs: 0.7 10*3/uL — ABNORMAL LOW (ref 1.0–3.6)
MCH: 28.3 pg (ref 26.0–34.0)
MCHC: 33.1 g/dL (ref 32.0–36.0)
MCV: 85.6 fL (ref 80.0–100.0)
Monocytes Absolute: 1 10*3/uL — ABNORMAL HIGH (ref 0.2–0.9)
Monocytes Relative: 20 %
Neutro Abs: 3.2 10*3/uL (ref 1.4–6.5)
Neutrophils Relative %: 65 %
Platelets: 254 10*3/uL (ref 150–440)
RBC: 3.42 MIL/uL — ABNORMAL LOW (ref 3.80–5.20)
RDW: 17.9 % — ABNORMAL HIGH (ref 11.5–14.5)
WBC: 5 10*3/uL (ref 3.6–11.0)

## 2017-04-03 LAB — MAGNESIUM: Magnesium: 1.1 mg/dL — ABNORMAL LOW (ref 1.7–2.4)

## 2017-04-03 MED ORDER — EMPTY CONTAINERS FLEXIBLE MISC: 80.0000 mg/m2 | Freq: Once | Status: DC

## 2017-04-03 MED ORDER — SODIUM CHLORIDE 0.9 % IV SOLN
Freq: Once | INTRAVENOUS | Status: AC
  Administered 2017-04-03: 10:00:00 via INTRAVENOUS
  Filled 2017-04-03: qty 1000

## 2017-04-03 MED ORDER — ONDANSETRON HCL 4 MG PO TABS
8.0000 mg | ORAL_TABLET | Freq: Once | ORAL | Status: AC
  Administered 2017-04-03: 8 mg via ORAL
  Filled 2017-04-03: qty 2

## 2017-04-03 MED ORDER — SODIUM CHLORIDE 0.9 % IV SOLN
6.0000 g | Freq: Once | INTRAVENOUS | Status: AC
  Administered 2017-04-03: 6 g via INTRAVENOUS
  Filled 2017-04-03: qty 2

## 2017-04-03 MED ORDER — HEPARIN SOD (PORK) LOCK FLUSH 100 UNIT/ML IV SOLN
500.0000 [IU] | Freq: Once | INTRAVENOUS | Status: AC | PRN
  Administered 2017-04-03: 500 [IU]
  Filled 2017-04-03: qty 5

## 2017-04-03 MED ORDER — PACLITAXEL PROTEIN-BOUND CHEMO INJECTION 100 MG
80.0000 mg/m2 | Freq: Once | INTRAVENOUS | Status: AC
  Administered 2017-04-03: 125 mg via INTRAVENOUS
  Filled 2017-04-03: qty 25

## 2017-04-03 NOTE — Telephone Encounter (Signed)
Not sure what to do with this. Has this been filled? And did this go to the pharmacy already? I could not tell if it did. Thanks

## 2017-04-03 NOTE — Telephone Encounter (Signed)
No it has not, it needs verification on dosage and how often she takes this.

## 2017-04-03 NOTE — Telephone Encounter (Signed)
Please call patient for verification on dosage.

## 2017-04-03 NOTE — Progress Notes (Signed)
Patient states she continues to be SOB.  She feels tight in her upper abdomen.  Also states she is about out of her Lasix.  She has tried to get in touch with her PCP but has not been able to get it filled.  Wants to know if Dr. Mike Gip will manage this medication for her?  Patient states she is feeling much better.  Appetite is good.

## 2017-04-03 NOTE — Telephone Encounter (Signed)
Called patient to inform her that Dr. Mike Gip did not refill her Lasix.  She did not prescribe it and feels it  may be part of the problem with her magnesium and potassium being low.  She verbalized understanding.

## 2017-04-03 NOTE — Progress Notes (Signed)
[facility] day:  04/03/2017   Chief Complaint: [name] is a 71 y.o. female with stage IV ovarian cancer who is seen for assessment prior to day 8 of cycle #2 Abraxane.  HPI:  The patient was last seen in the medical oncology clinic by me on 03/27/2017.  At that time, she was doing well.  Her neuropathy was slightly worse.  She had gained a pound.  Magnesium was 1.2.  She received day 1 of cycle #2 Abraxane.  She received 6 gm of IV magnesium.  She received GCSF for 3 days.  Magnesium was 1.5 on 03/31/2017.  She received 4 gm of IV magnesium.  During the interim, patient has been doing well overall. She denies physical concerns today. Patient tolerated her last treatment much better that the first cycle. She did not experience any nausea, vomiting, or anorexia. Patient did however, experience  a "low grade" fever on Tuesday, which resolved without treatment. Patient's neuropathy is stable. She is able to button and zip without difficulties. Patient is eating well, however she has lost 4 pounds since her last visit.    Past Medical History:  Diagnosis Date  . CAD (coronary artery disease)    a. 09/2012 Cath: LM nl, LAD 95p, 94m LCX 945mOM2 50, RCA 100.  . Marland KitchenHF (congestive heart failure) ([facility]  . Cholelithiasis   . Collagen vascular disease (HCC)    Rheumatoid Arthritis.  . Esophageal stricture   . Exertional shortness of breath   . GERD (gastroesophageal reflux disease)   . H/O hiatal hernia   . Herniated disc   . History of pancytopenia   . Hyperlipidemia   . Hypertension   . Hypokalemia   . Leukopenia 2012   s/p bone marrow biopsy, Dr. PaMa Hillock. NSTEMI (non-ST elevated myocardial infarction) ([place]/2014   "mild" (10/18/2012)  . Ovarian cancer ([facility]   chemo and hysterectomy  . Pneumonia 2013; 08/2012   "one lung; double" (10/18/2012)  . Rheumatoid arthritis(714.0)   . Type I diabetes mellitus ([facility]   "dx'd in 1957"  (10/18/2012)    Past Surgical History:  Procedure Laterality Date  . ABDOMINAL HYSTERECTOMY  06/15/2015   Dx L/S, EXLAP TAH BSO omentectomy RSRx colostomy diaphragm resection stripping  . CARDIAC CATHETERIZATION  10/18/2012   "first one was today" (10/18/2012)  . CATARACT EXTRACTION W/ INTRAOCULAR LENS IMPLANT Right 2010  . CHOLECYSTECTOMY  06/15/2015   combined case with ovarian cancer debulking  . COLON SURGERY    . CORONARY ARTERY BYPASS GRAFT N/A 10/19/2012   Procedure: CORONARY ARTERY BYPASS GRAFTING (CABG);  Surgeon: StMelrose NakayamaMD;  Location: [facility] Service: Open Heart Surgery;  Laterality: N/A;  . ESOPHAGEAL DILATION     "3 or 4 times" (10/19/2011)  . ESOPHAGOGASTRODUODENOSCOPY  2012   Dr. IfMinna Merritts. OSTOMY    . OVARY SURGERY     removal  . PERIPHERAL VASCULAR CATHETERIZATION N/A 03/02/2015   Procedure: PoGlori Luisath Insertion;  Surgeon: JaAlgernon HuxleyMD;  Location: [facility];  Service: Cardiovascular;  Laterality: N/A;  . TUBAL LIGATION  1970    Family History  Problem Relation Age of Onset  . Diabetes Mother   . Arthritis Mother   . Diabetes Father   . Arthritis Father   . Aneurysm Sister        neck  . Cancer Sister  lung    Social History:  reports that she quit smoking about 22 years ago. Her smoking use included cigarettes. She has a 30.00 pack-year smoking history. she has never used smokeless tobacco. She reports that she does not drink alcohol or use drugs.  Patient is a retired Art therapist. Patient denies exposure to radiation and toxins. She is accompanied by her husband today.  Allergies:  Allergies  Allergen Reactions  . Codeine Nausea And Vomiting  . Latex Rash    Current Medications: Current Outpatient Medications  Medication Sig Dispense Refill  . Calcium-Vitamin D 600-200 MG-UNIT tablet Take 1 tablet by mouth 2 (two) times daily.     . cholecalciferol (VITAMIN D) 1000 units tablet Take 1,000 Units by mouth daily.    .  DULoxetine (CYMBALTA) 20 MG capsule Take by mouth.    . enoxaparin (LOVENOX) 60 MG/0.6ML injection INJECT 0.6 ML (60 MG TOTAL) INTO THE SKIN EVERY 12 HOURS 60 mL 3  . folic acid (FOLVITE) 1 MG tablet Take 1 tablet (1 mg total) by mouth daily. 90 tablet 3  . furosemide (LASIX) 80 MG tablet Take 0.5 tablets by mouth 2 (two) times daily.    . insulin lispro (HUMALOG) 100 UNIT/ML injection Inject 0.06 mLs (6 Units total) into the skin daily. (Patient taking differently: Inject 0-85 Units into the skin daily. Via insulin pump) 30 mL 3  . lidocaine-prilocaine (EMLA) cream APPLY 1 APPLICATION TOPICALLY AS NEEDED 1 HOUR PRIOR TO TREATMENT. COVER WITH PRESS N SEAL UNTIL TREATMENT TIME AS DIRECTED 30 g 1  . loratadine (CLARITIN) 10 MG tablet Take 1 tablet by mouth daily as needed.    . magnesium oxide (MAG-OX) 400 (241.3 Mg) MG tablet TAKE 1 TABLET BY MOUTH ONCE DAILY 30 tablet 1  . metoprolol tartrate (LOPRESSOR) 25 MG tablet Take 1 tablet (25 mg total) by mouth 2 (two) times daily. 180 tablet 3  . ondansetron (ZOFRAN) 8 MG tablet TAKE 1 TABLET BY MOUTH EVERY 8 HOURS AS NEEDED FOR NAUSEA OR VOMITING 30 tablet 1  . pantoprazole (PROTONIX) 40 MG tablet TAKE 1 TABLET BY MOUTH TWICE (2) DAILY 180 tablet 1  . potassium chloride SA (K-DUR,KLOR-CON) 20 MEQ tablet TAKE 1 TABLET BY MOUTH ONCE A DAY 30 tablet 0  . predniSONE (DELTASONE) 5 MG tablet Take 5 mg by mouth daily with breakfast.    . vitamin C (ASCORBIC ACID) 500 MG tablet Take 500 mg by mouth daily.    Marland Kitchen zinc gluconate 50 MG tablet Take 50 mg daily by mouth.     Current Facility-Administered Medications  Medication Dose Route Frequency Provider Last Rate Last Dose  . Tbo-Filgrastim (GRANIX) injection 300 mcg  300 mcg Subcutaneous Once Lequita Asal, MD       Facility-Administered Medications Ordered in Other Visits  Medication Dose Route Frequency Provider Last Rate Last Dose  . magnesium sulfate IVPB 4 g 100 mL  4 g Intravenous Once Faythe Casa E, NP      . sodium chloride 0.9 % injection 10 mL  10 mL Intracatheter PRN Corcoran, Melissa C, MD      . sodium chloride 0.9 % injection 10 mL  10 mL Intravenous PRN Lequita Asal, MD   10 mL at 04/13/15 4287    Review of Systems:  GENERAL:  Feels "ok".  Interval low grade fever, resolved.  Nochills or sweats.  Weight down 4 pounds. PERFORMANCE STATUS (ECOG):  1 HEENT:  No visual changes, sore throat, mouth  sores or tenderness. Lungs:  No shortness of breath.  No cough.  No hemoptysis. Cardiac:  No chest pain, palpitations, orthopnea, or PND. GI:  Eating well.  No nausea, vomiting, diarrhea, melena or hematochezia. GU:  No urgency, frequency, dysuria, or hematuria. Musculoskeletal:  Back pain.  Severe rheumatoid arthritis (on prednisone).  Osteoporosis.  No back pain. No muscle tenderness. Extremities:  No pain or swelling. Skin:  No increased bruising or bleeding.  No rashes or skin changes. Neuro:  Neuropathy (stable- see HPI).  No headache, numbness or weakness, balance or coordination issues. Endocrine:  Diabetes on an insulin pump.  Blood sugar elevated on steroids.  Occasional hot flashes.  Psych:  No mood changes, depression or anxiety. Pain:  No pain. Review of systems:  All other systems reviewed and found to be negative.  Physical Exam:  Blood pressure 124/67, pulse 94, temperature 97.9 F (36.6 C), temperature source Tympanic, resp. rate 20, weight 133 lb 7 oz (60.5 kg). GENERAL:  Well developed, well nourished woman sitting comfortably in the exam room in no acute distress.  She has a cane at her side. MENTAL STATUS:  Alert and oriented to person, place and time. HEAD:  Wearing a blue cap.  Alopecia.  Normocephalic, atraumatic, face symmetric, no Cushingoid features. EYES:  Gold rimmed glasses.  Blue eyes.  No conjunctivitis or scleral icterus.  ENT: Oropharynx clear without lesion. Tongue normal. Mucous membranes moist.  RESPIRATORY: Decreased breath  sounds right base.  No wheezes or rhonchi. CARDIOVASCULAR: Regular rate and rhythm without murmur, rub or gallop. ABDOMEN: Soft, , non-tender with active bowel sounds and no appreciable hepatosplenomegaly. No palpable nodularity or masses.  Insulin pump.  Ostomy bag. SKIN: No rashes or ulcers. EXTREMITIES:  No edema, skin discoloration or tenderness. No palpable cords. LYMPH NODES: No palpable cervical, supraclavicular, axillary or inguinal adenopathy  NEUROLOGICAL: Appropriate. PSYCH: Appropriate.   Radiology studies: 02/13/2015:  Abdomen and pelvic CT revealed bilateral mass-like adnexal regions (right adnexal mass 5.6 x 5.0 cm and the left adnexa mass 10.3 x 6.0 x 9.9 cm).  There was a large amount of soft tissue throughout the peritoneal cavity involving the omentum and other peritoneal surfaces.  There was a small volume ascites. There was a peripheral 3.3 x 1.9 cm low-attenuation lesion overlying the right lobe of the liver, likely representing a serosal implant. There was 1.4 x 2.2 cm ill-defined peripheral lesion within the inferior aspect of segment 6 adjacent to the ampulla in the duodenum.  04/28/2015:  Abdominal and pelvic CT revealed decreasing bilateral ovarian masses.   The left adnexal mass measured 4.0 x 6.1 cm (previously 5.0 x 8.5 cm). The right ovary measured 3.8 x 4.9 cm (previously 4.7 x 5.6 cm).  There was improved peritoneal carcinomatosis.  There was a small amount of ascites.  The hepatic dome lesion was stable. The previously seen right hepatic lobe lesion was not well-visualized.  There was a small left pleural effusion and trace right pleural effusion.  The nodular lesion at the ampulla of Vater, extending into the duodenum was stable.  There was no biliary ductal dilatation. 08/01/2015:  Rght upper extremity ultrasound revealed a near occlusive thrombus within the central portion of the right internal jugular vein and central portion of the right subclavian  vein. 09/01/2015:  Chest, abdomen, and pelvic CT revealed continued decrease in perihepatic fluid collection contiguous with the right pleural space with percutaneous drain.  09/11/2015:  Chest, abdomen, and pelvic CT revealed a residual versus recurrent  fluid collection posterior to the right hepatic lobe (2.6 x 1.1 x 4.0 cm).   10/13/2015:  Chest, abdomen, and pelvic CT revealed resolution of the empyema. 02/15/2016:  Chest, abdomen, and pelvic CT revealed multiple soft tissue nodules throughout the pelvis, small bowel mesenteric and peritoneum and, concerning for peritoneal metastatic disease.  There was soft tissue irregularity within the left upper quadrant.  There was mild right hydronephrosis (etiology unclear).  There was interval resolution of previously described right pleural based fluid and gas collection. There was a pleural based nodule within the right lower hemi-thorax concerning for pleural based metastasis.  There were small bilateral pleural effusions.  There was pulmonary nodularity, predominately within the left upper lobe (metastatic or infectious/inflammatory etiology).  There was slightly increased mediastinal adenopathy (infectious/inflammatory or metastatic).  There was a low attenuation lesion within the left hepatic lobe (complicated fluid within the fissure or metastatic disease). 03/08/2016:  Abdomen and pelvic CT revealed mild progression of peritoneal disease in the abdomen.  There was interval increase in loculated fluid around the lateral segment left liver, stomach, and spleen.  There was persistent soft tissue lesion at the level of the ampulla with mild intra and extrahepatic biliary duct dilatation.  There was persistent intrahepatic and capsular metastatic disease involving the liver. 05/26/2016:  Head MRI revealed multiple small areas of acute infarct involving the occipital parietal lobe bilaterally and the left lateral cerebellum,  consistent with posterior  circulation emboli.  06/10/2016:  Adomen and pelvic CT revealed progression of peritoneal carcinomatosis predominantly in the perihepatic space, bilateral lower quadrants and pelvis.  There was worsening bilateral obstructive uropathy due to malignant involvement of the pelvic ureters.  There was stable small pleural/subpleural nodules at the right lung base.  There was stable small left pleural effusion.  There was decreased small volume perihepatic ascites.  08/09/2016:  Abdomen and pelvic CT revealed interval increase and loculated fluid collections within the peritoneal space consistent with progression of peritoneal ovarian carcinoma metastasis.  There was one large 7 cm collection in the RIGHT lower quadrant which had increased significantly in size and may represent of point of small bowel obstruction.  The proximal stomach and duodenum were decompressed. The mid small bowel was mildly dilated. There was concern for obstruction given the large intraperitoneal fluid collection.  There was increase in subcapsular hepatic metastatic fluid collections.  There was enlargement of rounded ampullary lesion in the second portion duodenum concerning for metastatic lesion.  There was mild biliary duct dilatation (stable).  There was nodular pleural metastasis in the RIGHT lower lobe pleural space. 11/03/2016:  Abdomen and pelvic CT revealed multifocal cystic metastases in the abdomen/pelvis, reflecting peritoneal disease.  The dominant lesion in the left anterior abdomen has progressed, while additional lesions were mixed.  Peritoneal disease along the liver and spleen progressed.  Pleural-based metastases along the posterior right hemithorax progressed. There were trace left pleural effusion.  There was a stable 1.7 cm duodenal lesion at the ampulla. 11/29/2016:  Abdomen and pelvic CT revealed mild interval enlargement of pleural metastasis the RIGHT lower lobe.  There was overall mild decrease in cystic peritoneal  metastasis within the abdomen pelvis. The solid lesion at the terminal ileum was decreased in size. Subcapsular lesion in the liver was slightly decreased in size.  There was no evidence of disease progression the abdomen pelvis.  There was mild intrahepatic duct dilatation similar prior.  The ampullary lesion was slightly larger.  There was mild hydronephrosis and hydroureter on the  LEFT likely related to cystic metastasis at the LEFT vesicoureteral junction 12/09/2016:  MRCP revealed the cystic lesion within the pancreatic head/uncinate process had decreased in size and demonstrated no suspicious characteristics. This can be presumed a pseudocyst or focus of side branch duct ectasia.  There was similar to slight progression of extensive peritoneal metastasis since 11/29/2016. Grossly similar abdominal nodal and right pleural metastasis.  There was chronic mild common duct dilatation with similar soft tissue fullness about the ampulla compared to 11/29/2016.  There was improved to resolved left-sided hydronephrosis. 01/20/2017:  Abdomen and pelvic CT revealed complex mixed interval changes in widespread metastatic disease in the peritoneal cavity, lower chest and thoracic, abdominal and pelvic lymph nodes. Right lower chest pleural metastasis and several of the peritoneal tumor implants had increased.  There was new right inguinal lymphadenopathy.  Some of the peritoneal tumor implants had decreased.  The small dependent left pleural effusion was slightly increased .  The mild left hydroureteronephrosis was improved.  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   The chronic biliary ductal dilatation was stable .  There was no evidence of bowel obstruction.  Admissions: [place] from 06/15/2015 - 06/22/2015.  She underwent exploratory laparotomy, lysis of adhesions, total abdominal hysterectomy with bilateral salpingo-oophorectomy, infracolic omentectomy, optimal tumor debulking(< 1 cm), recto-sigmoid resection with  creation of end colostomy, cholecystectomy, mobilization of splenic flexure and liver with diaphragmatic stripping on 06/15/2015. The right diaphragm was cleared of tumor. During dissection, the diaphragm was entered and closed with sutures.  [place] from 07/01/2015 - 07/03/2015 with pleural effusion. [place] from 07/07/2015 - 07/09/2015 with recurrent pleural effusion. [place] from 07/29/2015 - 08/10/2015 with a right-sided empyema and liver abscess. She underwent CT-guided placement of a liver abscess drain on 07/30/2015.  Liver abscess culture grew out group B strep and Enterobacter which was sensitive to Zosyn. She was transitioned to ertapenem Colbert Ewing) prior to discharge.  She was readmitted to [facility] on 09/17/2015. 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  Head MRI on 05/26/2016 revealed multiple small areas of acute infarct involving the occipital parietal lobe bilaterally and the left lateral cerebellum,  consistent with posterior circulation emboli.  Work-up included a negative carotid ultrasound and echocardiogram. [facility] from 08/10/2016 - 08/14/2016 with a small bowel obstruction.  She was managed conservatively. [place] from 09/27/2016 - 09/28/2016 with symptomatic anemia and diarrhea.   Appointment on 04/03/2017  Component Date Value Ref Range Status  . Magnesium 04/03/2017 1.1* 1.7 - 2.4 mg/dL Final  . Sodium 04/03/2017 131* 135 - 145 mmol/L Final  . Potassium 04/03/2017 4.1  3.5 - 5.1 mmol/L Final  . Chloride 04/03/2017 97* 101 - 111 mmol/L Final  . CO2 04/03/2017 26  22 - 32 mmol/L Final  . Glucose, Bld 04/03/2017 342* 65 - 99 mg/dL Final  . BUN 04/03/2017 18  6 - 20 mg/dL Final  . Creatinine, Ser 04/03/2017 0.77  0.44 - 1.00 mg/dL Final  . Calcium 04/03/2017 8.9  8.9 - 10.3 mg/dL Final  . Total Protein 04/03/2017 6.6  6.5 - 8.1 g/dL Final  . Albumin 04/03/2017 2.8* 3.5 - 5.0 g/dL Final  . AST 04/03/2017 20  15 - 41 U/L Final  . ALT 04/03/2017  11* 14 - 54 U/L Final  . Alkaline Phosphatase 04/03/2017 94  38 - 126 U/L Final  . Total Bilirubin 04/03/2017 0.5  0.3 - 1.2 mg/dL Final  . GFR calc non Af Amer 04/03/2017 >60  >60 mL/min Final  .  GFR calc Af Amer 04/03/2017 >60  >60 mL/min Final   Comment: (NOTE) The eGFR has been calculated using the CKD EPI equation. This calculation has not been validated in all clinical situations. eGFR's persistently <60 mL/min signify possible Chronic Kidney Disease.   . Anion gap 04/03/2017 8  5 - 15 Final  . WBC 04/03/2017 5.0  3.6 - 11.0 K/uL Final  . RBC 04/03/2017 3.42* 3.80 - 5.20 MIL/uL Final  . Hemoglobin 04/03/2017 9.7* 12.0 - 16.0 g/dL Final  . HCT 04/03/2017 29.3* 35.0 - 47.0 % Final  . MCV 04/03/2017 85.6  80.0 - 100.0 fL Final  . MCH 04/03/2017 28.3  26.0 - 34.0 pg Final  . MCHC 04/03/2017 33.1  32.0 - 36.0 g/dL Final  . RDW 04/03/2017 17.9* 11.5 - 14.5 % Final  . Platelets 04/03/2017 254  150 - 440 K/uL Final  . Neutrophils Relative % 04/03/2017 65  % Final  . Neutro Abs 04/03/2017 3.2  1.4 - 6.5 K/uL Final  . Lymphocytes Relative 04/03/2017 14  % Final  . Lymphs Abs 04/03/2017 0.7* 1.0 - 3.6 K/uL Final  . Monocytes Relative 04/03/2017 20  % Final  . Monocytes Absolute 04/03/2017 1.0* 0.2 - 0.9 K/uL Final  . Eosinophils Relative 04/03/2017 0  % Final  . Eosinophils Absolute 04/03/2017 0.0  0 - 0.7 K/uL Final  . Basophils Relative 04/03/2017 1  % Final  . Basophils Absolute 04/03/2017 0.0  0 - 0.1 K/uL Final    Assessment:  LASONIA CASINO is a 71 y.o. female with progressive stage IV ovarian cancer.  She presented with abdominal discomfort and bloating.  Omental biopsy on 02/23/2015 revealed metastatic high grade serous carcinoma, consistent with gynecologic origin.   She was initially diagnosed with clinical stage IIIC (T3cN1Mx).  CA125 was 707 on 02/17/2015.  She received 4 cycles of neoadjuvant carboplatin and Taxol (03/05/2015 - 05/22/2015).  Cycle #1 was notable for grade  I-II neuropathy.  She had loose stools on oral magnesium.  She was initially on Neurontin then switched Lyrica with cycle #3.  Cycle #4 was notable for neutropenia (ANC 300) requiring GCSF x 3 days.    She underwent exploratory laparotomy, lysis of adhesions, total abdominal hysterectomy with bilateral salpingo-oophorectomy, infracolic omentectomy, optimal tumor debulking(< 1 cm), recto-sigmoid resection with creation of end colostomy, cholecystectomy, mobilization of splenic flexure and liver with diaphragmatic stripping on 06/15/2015. The right diaphragm was cleared of tumor. During dissection, the diaphragm was entered and closed with sutures.  She received tamoxifen from 02/25/2016 - 03/14/2016.  She received 4 cycles of carboplatin and gemcitabine (03/21/2016 - 05/24/2016) with GCSF/Neulasta support.  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  Cymbalta began on 02/24/2017  PDL-1 testing revealed a combined score was 5 (>=1 positive).     [employer] genetic testing negative for APC, ATM, BMPR1A, BRCA1, BRCA2, BRIP1, CDH1, CDK4, CDKN2A, CHEK2, EPCAM, MLH1, MSH2, MSH6, MUTYH, NBN, PALB2, PMS2, PTEN, RAD51C, RAD51D, SMAD4, STK11, TP53. Sequencing for select regions of POLE and POLD1, and large rearrangement analysis for select regions of GREM1 were negative.  She received 2 cycles of Doxil (06/21/2016 - 07/19/2016) with Neulasta support.  She received 4 cycles of topotecan with Neulasta support (08/22/2016 - 10/10/2016).    She received 3 cycles of pembrolizumab (Keytruda) from 12/06/2016 -  01/09/2017.  Cycle #1 was complicated by neutropenia (ANC 400) requiring GCSF/Granix x 3 days.  She received GCSF x 5 days with cycle #2.  Abdomen and pelvic  CT  on 01/20/2017 revealed complex mixed interval changes in widespread metastatic disease in the peritoneal cavity, lower chest and thoracic, abdominal and pelvic lymph nodes. Right lower chest pleural metastasis and several of the  peritoneal tumor implants had increased.  There was new right inguinal lymphadenopathy.  Some of the peritoneal tumor implants had decreased.  The small dependent left pleural effusion was slightly increased .  The mild left hydroureteronephrosis was improved.  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   The chronic biliary ductal dilatation was stable .  There was no evidence of bowel obstruction.  CA125 was 802.9 on 03/30/2015, 567.9 on 04/13/2015, 168.8 on 05/15/2015, 85.2 on 07/17/2015, 68.6 on 07/28/2015, 34.5 on 09/17/2015, 20.7 on 11/06/2015, 49 on 01/22/2016, 106.1 on 02/22/2016, 138.8 on 03/07/2016, 220.8 on 03/21/2016, 152.8 on 04/11/2016, 125.6 on 04/18/2016, 76.8 on 05/03/2016, 60.2 on 05/24/2016, 44 on 07/19/2016, 65.8 on 08/17/2016, 53.4 on 09/12/2016, 47.3 on 10/10/2016, 91 on 12/20/2016, 141.1 on 01/09/2017, 463.7 on 02/27/2017, and 907.1 on 03/27/2017.  She has a history of recurrent right sided pleural effusion.  She underwent thoracentesis of 650 cc on post-operative day 3.  She was admitted to [facility] on 07/01/2015 and 07/07/2015 for recurrent shortness of breath.  She underwent 2 additional thoracenteses (1.1 L on 07/02/2015 and 850 cc on 07/08/2015).  Cytology was negative x 2.  Bilateral lower extremity duplex on 07/03/2015 was negative.  Echo revealed an EF of 55-60% on 07/08/2015 and 60-65% on 05/27/2016.    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 She was on Lovenox 60 mg twice a day.  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  She has severe rheumatoid arthritis.  Methotrexate and Enbrel were initially on hold.  She has a normocytic anemia.  Work-up on 02/17/2015 and 05/24/2016 revealed a normal ferritin, B12, folate, TSH.  She denies any melena or hematochezia.    She has anemia due to chronic  disease. She received 1 unit PRBCs during her admission at [facility]. She denies any melena or hematochezia. She has diabetes and is on an insulin pump.  She has had persistent neutropenia felt secondary to her rheumatoid arthritis.  Folate and MMA were normal.  TSH was 6.13 (high) with a free T4 of 1.17 (0.61-1.12).  She began methotrexate (10 mg a week) and prednisone (5 mg a day) for severe rheumatoid arthritis on 12/17/2015.  She remains on prednisone alone (5 mg a day).  Bone marrow aspirate and biopsy on 06/09/2010 revealed a hypercellular marrow (70%) with no evidence of dysplasia or malignancy.  Flow cytometry was negative.  Cytogenetics were normal (46,XX).  FISH studies were negative for MDS.  Bone marrow aspirate and biopsy on 12/03/2015 revealed a normocellular to mildy hypercellular marrow for age (40%) with left shifted myelopoiesis, non specific dyserythropoiesis and mild megakaryocytic atypia with no increase in blasts.  There were multiple small nonspecific lymphoid aggregates (favor reactive). There was no increase in reticulin.  There was decreased myeloid cells (37%) with left shifted maturation and 1% atypical myelod blasts.  There was relatively increased monocytic cells (11%), relatively increased lymphoid cells (36%), and relatively increased eosinophils (6%).  Cytogenetics were normal (45, XX).  SNP microarray was normal.  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  She receives IV magnesium weekly.  She has chemotherapy induced anemia.  She has  received Procrit (last 11/07/2016).  Labs on 08/22/2016 revealed a normal B12 and folate.  Ferritin was 37, iron saturation 11% and TIBC 281.  Code status is DNR/DNI.  She developed acute LFT elevation on 11/28/2016.  Event was preceded by upper abdominal discomfort.  Hepatitis B core antibody total and hepatitis C antibody were negative.  She is day 8 of cycle #2 Abraxane (03/13/2017 - 03/27/2017).  Cycle #1 was truncated  after day 1 secondary to side effects and Thanksgiving.  Symptomatically, she feels "good".  She has a grade II neuropathy.  Exam is stable.  WBC is 5000 with an [place] of 3200. Hemoglobin is 9.7, hematocrit 29.3, and platelets 254,000.  Magnesium is 1.1 (low). Sodium is low at 131. Glucose is 343.  Plan: 1.    Labs today:  CBC with diff, CMP, Mg. 2.    Day 8 of cycle #2 Abraxane today. 3.    Magnesium low at 1.1. Will replace with 6 grams of intravenous magnesium.   4.    RTC on 12/04, 12/05, and 12/06 for GCSF 300 mcg SQ. 5.    RTC on 04/06/2017 for labs (Mg) and IV Mg. 6.    RTC on 04/10/2017 for MD assessment,  labs (CBC with diff, CMP, Mg), day 15 of cycle #2 Abraxane, IV Mg, and OnPro Neulasta.  7.    RTC on 04/17/2017 for labs (CBC with diff. Mg) and IV Mg. 8.    RTC on 04/24/2017 for labs (CBC with diff, Mg) and IV Mg 9.    RTC on 05/01/2017 for MD assessment, labs (CBC with diff, CMP, Mg, CA125), day 1 of cycle #3 Abraxane, and IV Mg. 10.  Discuss home health nursing orders to [facility] for Granix 349mg SQ injection to be given on 05/02/2017 due to [facility] being closed for New Years (if patient treated on 12/31).  11.  RTC on 01/02 and 01/03 for GCSF (or consider GCSF on 01/02 - 05/05/2017 without need for home health).   BHonor Loh NP  04/03/2017, 9:08 AM   I saw and evaluated the patient, participating in the key portions of the service and reviewing pertinent diagnostic studies and records.  I reviewed the nurse practitioner's note and agree with the findings and the plan.  The assessment and plan were discussed with the patient.  Several questions were asked by the patient and answered.   MNolon Stalls MD 04/03/2017,9:08 AM

## 2017-04-04 ENCOUNTER — Other Ambulatory Visit: Payer: Self-pay | Admitting: Urgent Care

## 2017-04-04 ENCOUNTER — Inpatient Hospital Stay: Payer: Medicare Other

## 2017-04-04 DIAGNOSIS — C561 Malignant neoplasm of right ovary: Secondary | ICD-10-CM | POA: Diagnosis not present

## 2017-04-04 DIAGNOSIS — Z7189 Other specified counseling: Secondary | ICD-10-CM

## 2017-04-04 DIAGNOSIS — C786 Secondary malignant neoplasm of retroperitoneum and peritoneum: Secondary | ICD-10-CM

## 2017-04-04 DIAGNOSIS — D649 Anemia, unspecified: Secondary | ICD-10-CM

## 2017-04-04 DIAGNOSIS — Z5111 Encounter for antineoplastic chemotherapy: Secondary | ICD-10-CM

## 2017-04-04 DIAGNOSIS — C569 Malignant neoplasm of unspecified ovary: Secondary | ICD-10-CM

## 2017-04-04 DIAGNOSIS — C801 Malignant (primary) neoplasm, unspecified: Secondary | ICD-10-CM

## 2017-04-04 MED ORDER — TBO-FILGRASTIM 300 MCG/0.5ML ~~LOC~~ SOSY
300.0000 ug | PREFILLED_SYRINGE | Freq: Once | SUBCUTANEOUS | Status: AC
  Administered 2017-04-04: 300 ug via SUBCUTANEOUS

## 2017-04-05 ENCOUNTER — Inpatient Hospital Stay: Payer: Medicare Other

## 2017-04-05 DIAGNOSIS — C561 Malignant neoplasm of right ovary: Secondary | ICD-10-CM | POA: Diagnosis not present

## 2017-04-05 MED ORDER — FUROSEMIDE 40 MG PO TABS: 40.0000 mg | ORAL_TABLET | Freq: Every day | ORAL | 1 refills | Status: DC | PRN

## 2017-04-05 MED ORDER — TBO-FILGRASTIM 300 MCG/0.5ML ~~LOC~~ SOSY
300.0000 ug | PREFILLED_SYRINGE | Freq: Once | SUBCUTANEOUS | Status: AC
  Administered 2017-04-05: 300 ug via SUBCUTANEOUS

## 2017-04-05 NOTE — Telephone Encounter (Signed)
Patient states she is taking 40 mg and takes it only when she needs it and when she has swelling in her legs. She estimates about once a week

## 2017-04-06 ENCOUNTER — Other Ambulatory Visit: Payer: Self-pay | Admitting: Hematology and Oncology

## 2017-04-06 ENCOUNTER — Ambulatory Visit: Payer: Medicare Other

## 2017-04-06 ENCOUNTER — Inpatient Hospital Stay: Payer: Medicare Other

## 2017-04-06 DIAGNOSIS — C800 Disseminated malignant neoplasm, unspecified: Principal | ICD-10-CM

## 2017-04-06 DIAGNOSIS — C561 Malignant neoplasm of right ovary: Secondary | ICD-10-CM

## 2017-04-06 LAB — MAGNESIUM: Magnesium: 1.4 mg/dL — ABNORMAL LOW (ref 1.7–2.4)

## 2017-04-06 MED ORDER — TBO-FILGRASTIM 300 MCG/0.5ML ~~LOC~~ SOSY
300.0000 ug | PREFILLED_SYRINGE | Freq: Once | SUBCUTANEOUS | Status: AC
  Administered 2017-04-06: 300 ug via SUBCUTANEOUS
  Filled 2017-04-06: qty 0.5

## 2017-04-06 MED ORDER — HEPARIN SOD (PORK) LOCK FLUSH 100 UNIT/ML IV SOLN
500.0000 [IU] | Freq: Once | INTRAVENOUS | Status: AC | PRN
Start: 2017-04-06 — End: 2017-04-06
  Administered 2017-04-06: 500 [IU]
  Filled 2017-04-06: qty 5

## 2017-04-06 MED ORDER — SODIUM CHLORIDE 0.9 % IV SOLN
6.0000 g | Freq: Once | INTRAVENOUS | Status: AC
  Administered 2017-04-06: 6 g via INTRAVENOUS
  Filled 2017-04-06: qty 12

## 2017-04-06 NOTE — Progress Notes (Signed)
Doran Durand., NP notified of Mg level 1.4 today, ok to proceed with 6 gm Mg today and Granix.

## 2017-04-10 ENCOUNTER — Other Ambulatory Visit: Payer: Medicare Other

## 2017-04-10 ENCOUNTER — Ambulatory Visit: Payer: Medicare Other

## 2017-04-11 ENCOUNTER — Other Ambulatory Visit: Payer: Self-pay

## 2017-04-11 ENCOUNTER — Inpatient Hospital Stay (HOSPITAL_BASED_OUTPATIENT_CLINIC_OR_DEPARTMENT_OTHER): Payer: Medicare Other | Admitting: Hematology and Oncology

## 2017-04-11 ENCOUNTER — Other Ambulatory Visit: Payer: Self-pay | Admitting: Hematology and Oncology

## 2017-04-11 ENCOUNTER — Inpatient Hospital Stay: Payer: Medicare Other

## 2017-04-11 VITALS — BP 111/70 | HR 81 | Temp 97.1°F | Resp 18 | Wt 130.9 lb

## 2017-04-11 DIAGNOSIS — I509 Heart failure, unspecified: Secondary | ICD-10-CM

## 2017-04-11 DIAGNOSIS — I2510 Atherosclerotic heart disease of native coronary artery without angina pectoris: Secondary | ICD-10-CM

## 2017-04-11 DIAGNOSIS — Z9641 Presence of insulin pump (external) (internal): Secondary | ICD-10-CM

## 2017-04-11 DIAGNOSIS — E876 Hypokalemia: Secondary | ICD-10-CM

## 2017-04-11 DIAGNOSIS — M069 Rheumatoid arthritis, unspecified: Secondary | ICD-10-CM

## 2017-04-11 DIAGNOSIS — E109 Type 1 diabetes mellitus without complications: Secondary | ICD-10-CM

## 2017-04-11 DIAGNOSIS — Z66 Do not resuscitate: Secondary | ICD-10-CM

## 2017-04-11 DIAGNOSIS — D6481 Anemia due to antineoplastic chemotherapy: Secondary | ICD-10-CM

## 2017-04-11 DIAGNOSIS — Z87442 Personal history of urinary calculi: Secondary | ICD-10-CM

## 2017-04-11 DIAGNOSIS — Z7189 Other specified counseling: Secondary | ICD-10-CM

## 2017-04-11 DIAGNOSIS — G629 Polyneuropathy, unspecified: Secondary | ICD-10-CM | POA: Diagnosis not present

## 2017-04-11 DIAGNOSIS — D61818 Other pancytopenia: Secondary | ICD-10-CM

## 2017-04-11 DIAGNOSIS — E785 Hyperlipidemia, unspecified: Secondary | ICD-10-CM

## 2017-04-11 DIAGNOSIS — C561 Malignant neoplasm of right ovary: Secondary | ICD-10-CM | POA: Diagnosis not present

## 2017-04-11 DIAGNOSIS — Z794 Long term (current) use of insulin: Secondary | ICD-10-CM

## 2017-04-11 DIAGNOSIS — C801 Malignant (primary) neoplasm, unspecified: Secondary | ICD-10-CM

## 2017-04-11 DIAGNOSIS — I252 Old myocardial infarction: Secondary | ICD-10-CM

## 2017-04-11 DIAGNOSIS — K219 Gastro-esophageal reflux disease without esophagitis: Secondary | ICD-10-CM

## 2017-04-11 DIAGNOSIS — C786 Secondary malignant neoplasm of retroperitoneum and peritoneum: Secondary | ICD-10-CM

## 2017-04-11 DIAGNOSIS — R634 Abnormal weight loss: Secondary | ICD-10-CM | POA: Diagnosis not present

## 2017-04-11 DIAGNOSIS — I10 Essential (primary) hypertension: Secondary | ICD-10-CM

## 2017-04-11 DIAGNOSIS — Z7689 Persons encountering health services in other specified circumstances: Secondary | ICD-10-CM | POA: Diagnosis not present

## 2017-04-11 DIAGNOSIS — Z5111 Encounter for antineoplastic chemotherapy: Secondary | ICD-10-CM

## 2017-04-11 DIAGNOSIS — K449 Diaphragmatic hernia without obstruction or gangrene: Secondary | ICD-10-CM

## 2017-04-11 DIAGNOSIS — C569 Malignant neoplasm of unspecified ovary: Secondary | ICD-10-CM

## 2017-04-11 DIAGNOSIS — I998 Other disorder of circulatory system: Secondary | ICD-10-CM

## 2017-04-11 DIAGNOSIS — T451X5A Adverse effect of antineoplastic and immunosuppressive drugs, initial encounter: Secondary | ICD-10-CM

## 2017-04-11 DIAGNOSIS — N1330 Unspecified hydronephrosis: Secondary | ICD-10-CM

## 2017-04-11 DIAGNOSIS — C800 Disseminated malignant neoplasm, unspecified: Principal | ICD-10-CM

## 2017-04-11 DIAGNOSIS — G620 Drug-induced polyneuropathy: Secondary | ICD-10-CM

## 2017-04-11 DIAGNOSIS — Z7984 Long term (current) use of oral hypoglycemic drugs: Secondary | ICD-10-CM

## 2017-04-11 LAB — COMPREHENSIVE METABOLIC PANEL
ALT: 9 U/L — ABNORMAL LOW (ref 14–54)
AST: 17 U/L (ref 15–41)
Albumin: 3 g/dL — ABNORMAL LOW (ref 3.5–5.0)
Alkaline Phosphatase: 88 U/L (ref 38–126)
Anion gap: 6 (ref 5–15)
BUN: 16 mg/dL (ref 6–20)
CO2: 28 mmol/L (ref 22–32)
Calcium: 8.7 mg/dL — ABNORMAL LOW (ref 8.9–10.3)
Chloride: 100 mmol/L — ABNORMAL LOW (ref 101–111)
Creatinine, Ser: 0.72 mg/dL (ref 0.44–1.00)
GFR calc Af Amer: >60 mL/min (ref >60)
GFR calc non Af Amer: >60 mL/min (ref >60)
Glucose, Bld: 180 mg/dL — ABNORMAL HIGH (ref 65–99)
Potassium: 4.1 mmol/L (ref 3.5–5.1)
Sodium: 134 mmol/L — ABNORMAL LOW (ref 135–145)
Total Bilirubin: 0.6 mg/dL (ref 0.3–1.2)
Total Protein: 6.7 g/dL (ref 6.5–8.1)

## 2017-04-11 LAB — CBC WITH DIFFERENTIAL/PLATELET
Basophils Absolute: 0 10*3/uL (ref 0–0.1)
Basophils Relative: 1 %
Eosinophils Absolute: 0 10*3/uL (ref 0–0.7)
Eosinophils Relative: 0 %
HCT: 27.9 % — ABNORMAL LOW (ref 35.0–47.0)
Hemoglobin: 9.4 g/dL — ABNORMAL LOW (ref 12.0–16.0)
Lymphocytes Relative: 14 %
Lymphs Abs: 0.6 10*3/uL — ABNORMAL LOW (ref 1.0–3.6)
MCH: 28.9 pg (ref 26.0–34.0)
MCHC: 33.5 g/dL (ref 32.0–36.0)
MCV: 86 fL (ref 80.0–100.0)
Monocytes Absolute: 0.8 10*3/uL (ref 0.2–0.9)
Monocytes Relative: 19 %
Neutro Abs: 2.7 10*3/uL (ref 1.4–6.5)
Neutrophils Relative %: 66 %
Platelets: 299 10*3/uL (ref 150–440)
RBC: 3.24 MIL/uL — ABNORMAL LOW (ref 3.80–5.20)
RDW: 18.6 % — ABNORMAL HIGH (ref 11.5–14.5)
WBC: 4.2 10*3/uL (ref 3.6–11.0)

## 2017-04-11 LAB — MAGNESIUM: Magnesium: 1.3 mg/dL — ABNORMAL LOW (ref 1.7–2.4)

## 2017-04-11 MED ORDER — SODIUM CHLORIDE 0.9 % IV SOLN
Freq: Once | INTRAVENOUS | Status: AC
  Administered 2017-04-11: 16:00:00 via INTRAVENOUS
  Filled 2017-04-11: qty 1000

## 2017-04-11 MED ORDER — PACLITAXEL PROTEIN-BOUND CHEMO INJECTION 100 MG
80.0000 mg/m2 | Freq: Once | Status: DC
  Filled 2017-04-11: qty 25

## 2017-04-11 MED ORDER — PACLITAXEL PROTEIN-BOUND CHEMO INJECTION 100 MG
125.0000 mg | Freq: Once | INTRAVENOUS | Status: AC
  Administered 2017-04-11: 125 mg via INTRAVENOUS
  Filled 2017-04-11: qty 25

## 2017-04-11 MED ORDER — MAGNESIUM SULFATE 50 % IJ SOLN
6.0000 g | Freq: Once | INTRAMUSCULAR | Status: AC
  Administered 2017-04-11: 6 g via INTRAVENOUS
  Filled 2017-04-11: qty 2

## 2017-04-11 MED ORDER — PEGFILGRASTIM 6 MG/0.6ML ~~LOC~~ PSKT
6.0000 mg | PREFILLED_SYRINGE | Freq: Once | SUBCUTANEOUS | Status: AC
  Administered 2017-04-11: 6 mg via SUBCUTANEOUS
  Filled 2017-04-11: qty 0.6

## 2017-04-11 MED ORDER — HEPARIN SOD (PORK) LOCK FLUSH 100 UNIT/ML IV SOLN
500.0000 [IU] | Freq: Once | INTRAVENOUS | Status: AC | PRN
  Administered 2017-04-11: 500 [IU]

## 2017-04-11 MED ORDER — ONDANSETRON HCL 4 MG PO TABS
8.0000 mg | ORAL_TABLET | Freq: Once | ORAL | Status: AC
  Administered 2017-04-11: 8 mg via ORAL
  Filled 2017-04-11: qty 2

## 2017-04-11 NOTE — Progress Notes (Signed)
[facility] day:  04/11/2017   Chief Complaint: [name] is a 71 y.o. female with stage IV ovarian cancer who is seen for assessment prior to day 15 of cycle #2 Abraxane.  HPI:  The patient was last seen in the medical oncology clinic by me on 04/03/2017.  At that time, she felt "good".  She had a grade II neuropathy.  Exam was stable.  WBC was 5000 with an [place] of 3200. Hemoglobin was 9.7, hematocrit 29.3, and platelets 254,000.  Magnesium was 1.1 (low). Sodium was low at 131. Glucose was 343.  She received day 8 of cycle #2 Abraxane followed by 3 days of GCSF.  She received 6 gm of magnesium.  Magnesium was 1.4 on 04/06/2017.  She received 6 gm of IV magnesium.  During the interim, patient has been doing "ok" overall. She had some nausea following her treatment, however it was well controlled with the prescribed antiemetics. Patient has lost 3 pounds. Patient continues to experience neuropathy. She states, "I am dropping things more now". Patient has not experienced any B symptoms or interval infections.    Past Medical History:  Diagnosis Date  . CAD (coronary artery disease)    a. 09/2012 Cath: LM nl, LAD 95p, 55m LCX 957mOM2 50, RCA 100.  . Marland KitchenHF (congestive heart failure) ([facility]  . Cholelithiasis   . Collagen vascular disease (HCC)    Rheumatoid Arthritis.  . Esophageal stricture   . Exertional shortness of breath   . GERD (gastroesophageal reflux disease)   . H/O hiatal hernia   . Herniated disc   . History of pancytopenia   . Hyperlipidemia   . Hypertension   . Hypokalemia   . Leukopenia 2012   s/p bone marrow biopsy, Dr. PaMa Hillock. NSTEMI (non-ST elevated myocardial infarction) ([facility]/2014   "mild" (10/18/2012)  . Ovarian cancer ([facility]2016   chemo and hysterectomy  . Pneumonia 2013; 08/2012   "one lung; double" (10/18/2012)  . Rheumatoid arthritis(714.0)   . Type I diabetes mellitus ([facility]   "dx'd in 1957" (10/18/2012)     Past Surgical History:  Procedure Laterality Date  . ABDOMINAL HYSTERECTOMY  06/15/2015   Dx L/S, EXLAP TAH BSO omentectomy RSRx colostomy diaphragm resection stripping  . CARDIAC CATHETERIZATION  10/18/2012   "first one was today" (10/18/2012)  . CATARACT EXTRACTION W/ INTRAOCULAR LENS IMPLANT Right 2010  . CHOLECYSTECTOMY  06/15/2015   combined case with ovarian cancer debulking  . COLON SURGERY    . CORONARY ARTERY BYPASS GRAFT N/A 10/19/2012   Procedure: CORONARY ARTERY BYPASS GRAFTING (CABG);  Surgeon: StMelrose NakayamaMD;  Location: [facility] Service: Open Heart Surgery;  Laterality: N/A;  . ESOPHAGEAL DILATION     "3 or 4 times" (10/19/2011)  . ESOPHAGOGASTRODUODENOSCOPY  2012   Dr. IfMinna Merritts. OSTOMY    . OVARY SURGERY     removal  . PERIPHERAL VASCULAR CATHETERIZATION N/A 03/02/2015   Procedure: PoGlori Luisath Insertion;  Surgeon: JaAlgernon HuxleyMD;  Location: [facility];  Service: Cardiovascular;  Laterality: N/A;  . TUBAL LIGATION  1970    Family History  Problem Relation Age of Onset  . Diabetes Mother   . Arthritis Mother   . Diabetes Father   . Arthritis Father   . Aneurysm Sister        neck  . Cancer Sister        lung  Social History:  reports that she quit smoking about 22 years ago. Her smoking use included cigarettes. She has a 30.00 pack-year smoking history. she has never used smokeless tobacco. She reports that she does not drink alcohol or use drugs.  Patient is a retired Art therapist. Patient denies exposure to radiation and toxins. She is accompanied by her husband today.  Allergies:  Allergies  Allergen Reactions  . Codeine Nausea And Vomiting  . Latex Rash    Current Medications: Current Outpatient Medications  Medication Sig Dispense Refill  . Calcium-Vitamin D 600-200 MG-UNIT tablet Take 1 tablet by mouth 2 (two) times daily.     . cholecalciferol (VITAMIN D) 1000 units tablet Take 1,000 Units by mouth daily.    . DULoxetine  (CYMBALTA) 20 MG capsule Take by mouth.    . enoxaparin (LOVENOX) 60 MG/0.6ML injection INJECT 0.6 ML (60 MG TOTAL) INTO THE SKIN EVERY 12 HOURS 60 mL 3  . folic acid (FOLVITE) 1 MG tablet Take 1 tablet (1 mg total) by mouth daily. 90 tablet 3  . furosemide (LASIX) 40 MG tablet Take 1 tablet (40 mg total) by mouth daily as needed for edema. 30 tablet 1  . insulin lispro (HUMALOG) 100 UNIT/ML injection Inject 0.06 mLs (6 Units total) into the skin daily. (Patient taking differently: Inject 0-85 Units into the skin daily. Via insulin pump) 30 mL 3  . lidocaine-prilocaine (EMLA) cream APPLY 1 APPLICATION TOPICALLY AS NEEDED 1 HOUR PRIOR TO TREATMENT. COVER WITH PRESS N SEAL UNTIL TREATMENT TIME AS DIRECTED 30 g 1  . loratadine (CLARITIN) 10 MG tablet Take 1 tablet by mouth daily as needed.    . magnesium oxide (MAG-OX) 400 (241.3 Mg) MG tablet TAKE 1 TABLET BY MOUTH ONCE DAILY 30 tablet 1  . metoprolol tartrate (LOPRESSOR) 25 MG tablet Take 1 tablet (25 mg total) by mouth 2 (two) times daily. 180 tablet 3  . ondansetron (ZOFRAN) 8 MG tablet TAKE 1 TABLET BY MOUTH EVERY 8 HOURS AS NEEDED FOR NAUSEA OR VOMITING 30 tablet 1  . pantoprazole (PROTONIX) 40 MG tablet TAKE 1 TABLET BY MOUTH TWICE (2) DAILY 180 tablet 1  . potassium chloride SA (K-DUR,KLOR-CON) 20 MEQ tablet TAKE 1 TABLET BY MOUTH ONCE A DAY 90 tablet 1  . predniSONE (DELTASONE) 5 MG tablet Take 5 mg by mouth daily with breakfast.    . vitamin C (ASCORBIC ACID) 500 MG tablet Take 500 mg by mouth daily.    Marland Kitchen zinc gluconate 50 MG tablet Take 50 mg daily by mouth.     Current Facility-Administered Medications  Medication Dose Route Frequency Provider Last Rate Last Dose  . Tbo-Filgrastim (GRANIX) injection 300 mcg  300 mcg Subcutaneous Once Lequita Asal, MD       Facility-Administered Medications Ordered in Other Visits  Medication Dose Route Frequency Provider Last Rate Last Dose  . magnesium sulfate IVPB 4 g 100 mL  4 g Intravenous  Once Faythe Casa E, NP      . sodium chloride 0.9 % injection 10 mL  10 mL Intracatheter PRN Elzie Sheets C, MD      . sodium chloride 0.9 % injection 10 mL  10 mL Intravenous PRN Lequita Asal, MD   10 mL at 04/13/15 8768    Review of Systems:  GENERAL:  Feels "ok".  No fever, chills or sweats.  Weight down 3 pounds. PERFORMANCE STATUS (ECOG):  1 HEENT:  No visual changes, sore throat, mouth sores or tenderness.  Lungs:  No shortness of breath.  No cough.  No hemoptysis. Cardiac:  No chest pain, palpitations, orthopnea, or PND. GI:  No nausea, vomiting, diarrhea, melena or hematochezia. GU:  No urgency, frequency, dysuria, or hematuria. Musculoskeletal:  Back pain.  Severe rheumatoid arthritis (on prednisone).  Osteoporosis.  No back pain. No muscle tenderness. Extremities:  No pain or swelling. Skin:  No increased bruising or bleeding.  No rashes or skin changes. Neuro:  Neuropathy (slightly increased- see HPI).  No headache, numbness or weakness, balance or coordination issues. Endocrine:  Diabetes on an insulin pump.  Blood sugar elevated on steroids.  Occasional hot flashes.  Psych:  No mood changes, depression or anxiety. Pain:  No pain. Review of systems:  All other systems reviewed and found to be negative.  Physical Exam:  Blood pressure 111/70, pulse 81, temperature (!) 97.1 F (36.2 C), temperature source Tympanic, resp. rate 18, weight 130 lb 14.4 oz (59.4 kg). GENERAL:  Well developed, well nourished woman sitting comfortably in the exam room in no acute distress.  She has a cane at her side. MENTAL STATUS:  Alert and oriented to person, place and time. HEAD:  Wearing a cap.  Alopecia.  Normocephalic, atraumatic, face symmetric, no Cushingoid features. EYES:  Gold rimmed glasses.  Blue eyes.  No conjunctivitis or scleral icterus.  ENT: Oropharynx clear without lesion. Tongue normal. Mucous membranes moist.  RESPIRATORY: Decreased breath sounds right base.   No wheezes or rhonchi. CARDIOVASCULAR: Regular rate and rhythm without murmur, rub or gallop. ABDOMEN: Soft, , non-tender with active bowel sounds and no appreciable hepatosplenomegaly. No palpable nodularity or masses.  Insulin pump.  Ostomy bag. SKIN: No rashes or ulcers. EXTREMITIES:  No edema, skin discoloration or tenderness. No palpable cords. LYMPH NODES: No palpable cervical, supraclavicular, axillary or inguinal adenopathy  NEUROLOGICAL: Appropriate. PSYCH: Appropriate.   Radiology studies: 02/13/2015:  Abdomen and pelvic CT revealed bilateral mass-like adnexal regions (right adnexal mass 5.6 x 5.0 cm and the left adnexa mass 10.3 x 6.0 x 9.9 cm).  There was a large amount of soft tissue throughout the peritoneal cavity involving the omentum and other peritoneal surfaces.  There was a small volume ascites. There was a peripheral 3.3 x 1.9 cm low-attenuation lesion overlying the right lobe of the liver, likely representing a serosal implant. There was 1.4 x 2.2 cm ill-defined peripheral lesion within the inferior aspect of segment 6 adjacent to the ampulla in the duodenum.  04/28/2015:  Abdominal and pelvic CT revealed decreasing bilateral ovarian masses.   The left adnexal mass measured 4.0 x 6.1 cm (previously 5.0 x 8.5 cm). The right ovary measured 3.8 x 4.9 cm (previously 4.7 x 5.6 cm).  There was improved peritoneal carcinomatosis.  There was a small amount of ascites.  The hepatic dome lesion was stable. The previously seen right hepatic lobe lesion was not well-visualized.  There was a small left pleural effusion and trace right pleural effusion.  The nodular lesion at the ampulla of Vater, extending into the duodenum was stable.  There was no biliary ductal dilatation. 08/01/2015:  Rght upper extremity ultrasound revealed a near occlusive thrombus within the central portion of the right internal jugular vein and central portion of the right subclavian vein. 09/01/2015:  Chest,  abdomen, and pelvic CT revealed continued decrease in perihepatic fluid collection contiguous with the right pleural space with percutaneous drain.  09/11/2015:  Chest, abdomen, and pelvic CT revealed a residual versus recurrent fluid collection posterior to the  right hepatic lobe (2.6 x 1.1 x 4.0 cm).   10/13/2015:  Chest, abdomen, and pelvic CT revealed resolution of the empyema. 02/15/2016:  Chest, abdomen, and pelvic CT revealed multiple soft tissue nodules throughout the pelvis, small bowel mesenteric and peritoneum and, concerning for peritoneal metastatic disease.  There was soft tissue irregularity within the left upper quadrant.  There was mild right hydronephrosis (etiology unclear).  There was interval resolution of previously described right pleural based fluid and gas collection. There was a pleural based nodule within the right lower hemi-thorax concerning for pleural based metastasis.  There were small bilateral pleural effusions.  There was pulmonary nodularity, predominately within the left upper lobe (metastatic or infectious/inflammatory etiology).  There was slightly increased mediastinal adenopathy (infectious/inflammatory or metastatic).  There was a low attenuation lesion within the left hepatic lobe (complicated fluid within the fissure or metastatic disease). 03/08/2016:  Abdomen and pelvic CT revealed mild progression of peritoneal disease in the abdomen.  There was interval increase in loculated fluid around the lateral segment left liver, stomach, and spleen.  There was persistent soft tissue lesion at the level of the ampulla with mild intra and extrahepatic biliary duct dilatation.  There was persistent intrahepatic and capsular metastatic disease involving the liver. 05/26/2016:  Head MRI revealed multiple small areas of acute infarct involving the occipital parietal lobe bilaterally and the left lateral cerebellum,  consistent with posterior circulation emboli.  06/10/2016:   Adomen and pelvic CT revealed progression of peritoneal carcinomatosis predominantly in the perihepatic space, bilateral lower quadrants and pelvis.  There was worsening bilateral obstructive uropathy due to malignant involvement of the pelvic ureters.  There was stable small pleural/subpleural nodules at the right lung base.  There was stable small left pleural effusion.  There was decreased small volume perihepatic ascites.  08/09/2016:  Abdomen and pelvic CT revealed interval increase and loculated fluid collections within the peritoneal space consistent with progression of peritoneal ovarian carcinoma metastasis.  There was one large 7 cm collection in the RIGHT lower quadrant which had increased significantly in size and may represent of point of small bowel obstruction.  The proximal stomach and duodenum were decompressed. The mid small bowel was mildly dilated. There was concern for obstruction given the large intraperitoneal fluid collection.  There was increase in subcapsular hepatic metastatic fluid collections.  There was enlargement of rounded ampullary lesion in the second portion duodenum concerning for metastatic lesion.  There was mild biliary duct dilatation (stable).  There was nodular pleural metastasis in the RIGHT lower lobe pleural space. 11/03/2016:  Abdomen and pelvic CT revealed multifocal cystic metastases in the abdomen/pelvis, reflecting peritoneal disease.  The dominant lesion in the left anterior abdomen has progressed, while additional lesions were mixed.  Peritoneal disease along the liver and spleen progressed.  Pleural-based metastases along the posterior right hemithorax progressed. There were trace left pleural effusion.  There was a stable 1.7 cm duodenal lesion at the ampulla. 11/29/2016:  Abdomen and pelvic CT revealed mild interval enlargement of pleural metastasis the RIGHT lower lobe.  There was overall mild decrease in cystic peritoneal metastasis within the abdomen  pelvis. The solid lesion at the terminal ileum was decreased in size. Subcapsular lesion in the liver was slightly decreased in size.  There was no evidence of disease progression the abdomen pelvis.  There was mild intrahepatic duct dilatation similar prior.  The ampullary lesion was slightly larger.  There was mild hydronephrosis and hydroureter on the LEFT likely related to cystic  metastasis at the LEFT vesicoureteral junction 12/09/2016:  MRCP revealed the cystic lesion within the pancreatic head/uncinate process had decreased in size and demonstrated no suspicious characteristics. This can be presumed a pseudocyst or focus of side branch duct ectasia.  There was similar to slight progression of extensive peritoneal metastasis since 11/29/2016. Grossly similar abdominal nodal and right pleural metastasis.  There was chronic mild common duct dilatation with similar soft tissue fullness about the ampulla compared to 11/29/2016.  There was improved to resolved left-sided hydronephrosis. 01/20/2017:  Abdomen and pelvic CT revealed complex mixed interval changes in widespread metastatic disease in the peritoneal cavity, lower chest and thoracic, abdominal and pelvic lymph nodes. Right lower chest pleural metastasis and several of the peritoneal tumor implants had increased.  There was new right inguinal lymphadenopathy.  Some of the peritoneal tumor implants had decreased.  The small dependent left pleural effusion was slightly increased .  The mild left hydroureteronephrosis was improved.  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   The chronic biliary ductal dilatation was stable .  There was no evidence of bowel obstruction.  Admissions: [place] from 06/15/2015 - 06/22/2015.  She underwent exploratory laparotomy, lysis of adhesions, total abdominal hysterectomy with bilateral salpingo-oophorectomy, infracolic omentectomy, optimal tumor debulking(< 1 cm), recto-sigmoid resection with creation of end colostomy,  cholecystectomy, mobilization of splenic flexure and liver with diaphragmatic stripping on 06/15/2015. The right diaphragm was cleared of tumor. During dissection, the diaphragm was entered and closed with sutures.  [place] from 07/01/2015 - 07/03/2015 with pleural effusion. [place] from 07/07/2015 - 07/09/2015 with recurrent pleural effusion. [place] from 07/29/2015 - 08/10/2015 with a right-sided empyema and liver abscess. She underwent CT-guided placement of a liver abscess drain on 07/30/2015.  Liver abscess culture grew out group B strep and Enterobacter which was sensitive to Zosyn. She was transitioned to ertapenem Colbert Ewing) prior to discharge.  She was readmitted to [facility] on 09/17/2015. 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  Head MRI on 05/26/2016 revealed multiple small areas of acute infarct involving the occipital parietal lobe bilaterally and the left lateral cerebellum,  consistent with posterior circulation emboli.  Work-up included a negative carotid ultrasound and echocardiogram. [facility] from 08/10/2016 - 08/14/2016 with a small bowel obstruction.  She was managed conservatively. [place] from 09/27/2016 - 09/28/2016 with symptomatic anemia and diarrhea.   Appointment on 04/11/2017  Component Date Value Ref Range Status  . Magnesium 04/11/2017 1.3* 1.7 - 2.4 mg/dL Final  . Sodium 04/11/2017 134* 135 - 145 mmol/L Final  . Potassium 04/11/2017 4.1  3.5 - 5.1 mmol/L Final  . Chloride 04/11/2017 100* 101 - 111 mmol/L Final  . CO2 04/11/2017 28  22 - 32 mmol/L Final  . Glucose, Bld 04/11/2017 180* 65 - 99 mg/dL Final  . BUN 04/11/2017 16  6 - 20 mg/dL Final  . Creatinine, Ser 04/11/2017 0.72  0.44 - 1.00 mg/dL Final  . Calcium 04/11/2017 8.7* 8.9 - 10.3 mg/dL Final  . Total Protein 04/11/2017 6.7  6.5 - 8.1 g/dL Final  . Albumin 04/11/2017 3.0* 3.5 - 5.0 g/dL Final  . AST 04/11/2017 17  15 - 41 U/L Final  . ALT 04/11/2017 9* 14 - 54 U/L Final  .  Alkaline Phosphatase 04/11/2017 88  38 - 126 U/L Final  . Total Bilirubin 04/11/2017 0.6  0.3 - 1.2 mg/dL Final  . GFR calc non Af Amer 04/11/2017 >60  >60 mL/min Final  . GFR calc Af Wyvonnia Lora  04/11/2017 >60  >60 mL/min Final   Comment: (NOTE) The eGFR has been calculated using the CKD EPI equation. This calculation has not been validated in all clinical situations. eGFR's persistently <60 mL/min signify possible Chronic Kidney Disease.   . Anion gap 04/11/2017 6  5 - 15 Final  . WBC 04/11/2017 4.2  3.6 - 11.0 K/uL Final  . RBC 04/11/2017 3.24* 3.80 - 5.20 MIL/uL Final  . Hemoglobin 04/11/2017 9.4* 12.0 - 16.0 g/dL Final  . HCT 04/11/2017 27.9* 35.0 - 47.0 % Final  . MCV 04/11/2017 86.0  80.0 - 100.0 fL Final  . MCH 04/11/2017 28.9  26.0 - 34.0 pg Final  . MCHC 04/11/2017 33.5  32.0 - 36.0 g/dL Final  . RDW 04/11/2017 18.6* 11.5 - 14.5 % Final  . Platelets 04/11/2017 299  150 - 440 K/uL Final  . Neutrophils Relative % 04/11/2017 66  % Final  . Neutro Abs 04/11/2017 2.7  1.4 - 6.5 K/uL Final  . Lymphocytes Relative 04/11/2017 14  % Final  . Lymphs Abs 04/11/2017 0.6* 1.0 - 3.6 K/uL Final  . Monocytes Relative 04/11/2017 19  % Final  . Monocytes Absolute 04/11/2017 0.8  0.2 - 0.9 K/uL Final  . Eosinophils Relative 04/11/2017 0  % Final  . Eosinophils Absolute 04/11/2017 0.0  0 - 0.7 K/uL Final  . Basophils Relative 04/11/2017 1  % Final  . Basophils Absolute 04/11/2017 0.0  0 - 0.1 K/uL Final    Assessment:  JAHNIYA DUZAN is a 71 y.o. female with progressive stage IV ovarian cancer.  She presented with abdominal discomfort and bloating.  Omental biopsy on 02/23/2015 revealed metastatic high grade serous carcinoma, consistent with gynecologic origin.   She was initially diagnosed with clinical stage IIIC (T3cN1Mx).  CA125 was 707 on 02/17/2015.  She received 4 cycles of neoadjuvant carboplatin and Taxol (03/05/2015 - 05/22/2015).  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  She had  loose stools on oral magnesium.  She was initially on Neurontin then switched Lyrica with cycle #3.  Cycle #4 was notable for neutropenia (ANC 300) requiring GCSF x 3 days.    She underwent exploratory laparotomy, lysis of adhesions, total abdominal hysterectomy with bilateral salpingo-oophorectomy, infracolic omentectomy, optimal tumor debulking(< 1 cm), recto-sigmoid resection with creation of end colostomy, cholecystectomy, mobilization of splenic flexure and liver with diaphragmatic stripping on 06/15/2015. The right diaphragm was cleared of tumor. During dissection, the diaphragm was entered and closed with sutures.  She received tamoxifen from 02/25/2016 - 03/14/2016.  She received 4 cycles of carboplatin and gemcitabine (03/21/2016 - 05/24/2016) with GCSF/Neulasta support.  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  Cymbalta began on 02/24/2017  PDL-1 testing revealed a combined score was 5 (>=1 positive).     [employer] genetic testing negative for APC, ATM, BMPR1A, BRCA1, BRCA2, BRIP1, CDH1, CDK4, CDKN2A, CHEK2, EPCAM, MLH1, MSH2, MSH6, MUTYH, NBN, PALB2, PMS2, PTEN, RAD51C, RAD51D, SMAD4, STK11, TP53. Sequencing for select regions of POLE and POLD1, and large rearrangement analysis for select regions of GREM1 were negative.  She received 2 cycles of Doxil (06/21/2016 - 07/19/2016) with Neulasta support.  She received 4 cycles of topotecan with Neulasta support (08/22/2016 - 10/10/2016).    She received 3 cycles of pembrolizumab (Keytruda) from 12/06/2016 -  01/09/2017.  Cycle #1 was complicated by neutropenia (ANC 400) requiring GCSF/Granix x 3 days.  She received GCSF x 5 days with cycle #2.  Abdomen and pelvic CT  on  01/20/2017 revealed complex mixed interval changes in widespread metastatic disease in the peritoneal cavity, lower chest and thoracic, abdominal and pelvic lymph nodes. Right lower chest pleural metastasis and several of the peritoneal tumor implants had  increased.  There was new right inguinal lymphadenopathy.  Some of the peritoneal tumor implants had decreased.  The small dependent left pleural effusion was slightly increased .  The mild left hydroureteronephrosis was improved.  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   The chronic biliary ductal dilatation was stable .  There was no evidence of bowel obstruction.  CA125 was 802.9 on 03/30/2015, 567.9 on 04/13/2015, 168.8 on 05/15/2015, 85.2 on 07/17/2015, 68.6 on 07/28/2015, 34.5 on 09/17/2015, 20.7 on 11/06/2015, 49 on 01/22/2016, 106.1 on 02/22/2016, 138.8 on 03/07/2016, 220.8 on 03/21/2016, 152.8 on 04/11/2016, 125.6 on 04/18/2016, 76.8 on 05/03/2016, 60.2 on 05/24/2016, 44 on 07/19/2016, 65.8 on 08/17/2016, 53.4 on 09/12/2016, 47.3 on 10/10/2016, 91 on 12/20/2016, 141.1 on 01/09/2017, 463.7 on 02/27/2017, and 907.1 on 03/27/2017.  She has a history of recurrent right sided pleural effusion.  She underwent thoracentesis of 650 cc on post-operative day 3.  She was admitted to [facility] on 07/01/2015 and 07/07/2015 for recurrent shortness of breath.  She underwent 2 additional thoracenteses (1.1 L on 07/02/2015 and 850 cc on 07/08/2015).  Cytology was negative x 2.  Bilateral lower extremity duplex on 07/03/2015 was negative.  Echo revealed an EF of 55-60% on 07/08/2015 and 60-65% on 05/27/2016.    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 She was on Lovenox 60 mg twice a day.  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  She has severe rheumatoid arthritis.  Methotrexate and Enbrel were initially on hold.  She has a normocytic anemia.  Work-up on 02/17/2015 and 05/24/2016 revealed a normal ferritin, B12, folate, TSH.  She denies any melena or hematochezia.    She has anemia due to chronic disease. She received 1 unit  PRBCs during her admission at [facility]. She denies any melena or hematochezia. She has diabetes and is on an insulin pump.  She has had persistent neutropenia felt secondary to her rheumatoid arthritis.  Folate and MMA were normal.  TSH was 6.13 (high) with a free T4 of 1.17 (0.61-1.12).  She began methotrexate (10 mg a week) and prednisone (5 mg a day) for severe rheumatoid arthritis on 12/17/2015.  She remains on prednisone alone (5 mg a day).  Bone marrow aspirate and biopsy on 06/09/2010 revealed a hypercellular marrow (70%) with no evidence of dysplasia or malignancy.  Flow cytometry was negative.  Cytogenetics were normal (46,XX).  FISH studies were negative for MDS.  Bone marrow aspirate and biopsy on 12/03/2015 revealed a normocellular to mildy hypercellular marrow for age (40%) with left shifted myelopoiesis, non specific dyserythropoiesis and mild megakaryocytic atypia with no increase in blasts.  There were multiple small nonspecific lymphoid aggregates (favor reactive). There was no increase in reticulin.  There was decreased myeloid cells (37%) with left shifted maturation and 1% atypical myelod blasts.  There was relatively increased monocytic cells (11%), relatively increased lymphoid cells (36%), and relatively increased eosinophils (6%).  Cytogenetics were normal (25, XX).  SNP microarray was normal.  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  She receives IV magnesium weekly.  She has chemotherapy induced anemia.  She has received Procrit (last  11/07/2016).  Labs on 08/22/2016 revealed a normal B12 and folate.  Ferritin was 37, iron saturation 11% and TIBC 281.  Code status is DNR/DNI.  She developed acute LFT elevation on 11/28/2016.  Event was preceded by upper abdominal discomfort.  Hepatitis B core antibody total and hepatitis C antibody were negative.  She is day 15 of cycle #2 Abraxane (03/13/2017 - 03/27/2017).  Cycle #1 was truncated after day 1 secondary to side  effects and Thanksgiving.  Symptomatically, she feels "good".  She has a slightly worse grade II neuropathy.  Exam is stable.  WBC is 4200 with an [place] of 2700. Hemoglobin is 9.4, hematocrit 27.9, and platelets 299,000.  Magnesium is 1.3 (low).   Plan: 1.  Labs today:  CBC with diff, CMP, Mg. 2.  Day 15 of cycle #2 Abraxane and OnPro Neulasta today.  Discuss increased neuropathy.  Patient wishes to continue at current dose. 3.  Magnesium low at 1.3. Will replace with 6 grams of intravenous magnesium.   4.  RTC on 04/14/2017 for labs (CBC with diff, Mg), and IV Mg 5.  RTC on 12/17, 12/21, 12/24, and 12/28 for labs (BMP, Mg) and IV Mg.  Check CBC weekly. 6.  RTC on 05/01/2017 for MD assessment, labs (CBC with diff, CMP, Mg, CA125), day 1 of cycle #3 Abraxane, and IV Mg. 7.  RTC on 01/02, 01/03, and 01/04 for GCSF. 8.  RTC on 01/04 for labs (CBC with diff, Mg) and IV Mg 9.  Check on [facility] and PDL-1 to determine eligibility for Vitrakvi.    Honor Loh, NP  04/11/2017, 11:15 AM   I saw and evaluated the patient, participating in the key portions of the service and reviewing pertinent diagnostic studies and records.  I reviewed the nurse practitioner's note and agree with the findings and the plan.  The assessment and plan were discussed with the patient.  Several questions were asked by the patient and answered.   Nolon Stalls, MD 04/11/2017,11:15 AM

## 2017-04-11 NOTE — Progress Notes (Signed)
Here for follow up. Doing well she stated  

## 2017-04-14 ENCOUNTER — Inpatient Hospital Stay: Payer: Medicare Other

## 2017-04-14 ENCOUNTER — Other Ambulatory Visit: Payer: Self-pay | Admitting: Hematology and Oncology

## 2017-04-14 VITALS — BP 109/64 | HR 83 | Temp 97.6°F | Resp 20

## 2017-04-14 DIAGNOSIS — C561 Malignant neoplasm of right ovary: Secondary | ICD-10-CM | POA: Diagnosis not present

## 2017-04-14 DIAGNOSIS — C569 Malignant neoplasm of unspecified ovary: Secondary | ICD-10-CM

## 2017-04-14 LAB — CBC WITH DIFFERENTIAL/PLATELET
Basophils Absolute: 0.1 10*3/uL (ref 0–0.1)
Basophils Relative: 0 %
Eosinophils Absolute: 0 10*3/uL (ref 0–0.7)
Eosinophils Relative: 0 %
HCT: 27 % — ABNORMAL LOW (ref 35.0–47.0)
Hemoglobin: 8.7 g/dL — ABNORMAL LOW (ref 12.0–16.0)
Lymphocytes Relative: 2 %
Lymphs Abs: 0.6 10*3/uL — ABNORMAL LOW (ref 1.0–3.6)
MCH: 28.2 pg (ref 26.0–34.0)
MCHC: 32.4 g/dL (ref 32.0–36.0)
MCV: 87.2 fL (ref 80.0–100.0)
Monocytes Absolute: 0.5 10*3/uL (ref 0.2–0.9)
Monocytes Relative: 2 %
Neutro Abs: 25.8 10*3/uL — ABNORMAL HIGH (ref 1.4–6.5)
Neutrophils Relative %: 96 %
Platelets: 281 10*3/uL (ref 150–440)
RBC: 3.1 MIL/uL — ABNORMAL LOW (ref 3.80–5.20)
RDW: 20.2 % — ABNORMAL HIGH (ref 11.5–14.5)
WBC: 27 10*3/uL — ABNORMAL HIGH (ref 3.6–11.0)

## 2017-04-14 LAB — MAGNESIUM: Magnesium: 1.5 mg/dL — ABNORMAL LOW (ref 1.7–2.4)

## 2017-04-14 MED ORDER — SODIUM CHLORIDE 0.9% FLUSH
10.0000 mL | Freq: Once | INTRAVENOUS | Status: AC | PRN
  Administered 2017-04-14: 10 mL
  Filled 2017-04-14: qty 10

## 2017-04-14 MED ORDER — HEPARIN SOD (PORK) LOCK FLUSH 100 UNIT/ML IV SOLN
500.0000 [IU] | Freq: Once | INTRAVENOUS | Status: AC | PRN
  Administered 2017-04-14: 500 [IU]
  Filled 2017-04-14: qty 5

## 2017-04-14 MED ORDER — SODIUM CHLORIDE 0.9 % IV SOLN
6.0000 g | Freq: Once | INTRAVENOUS | Status: AC
  Administered 2017-04-14: 6 g via INTRAVENOUS
  Filled 2017-04-14: qty 10

## 2017-04-16 ENCOUNTER — Encounter: Payer: Self-pay | Admitting: Hematology and Oncology

## 2017-04-17 ENCOUNTER — Inpatient Hospital Stay: Payer: Medicare Other

## 2017-04-17 ENCOUNTER — Ambulatory Visit: Payer: Medicare Other

## 2017-04-17 ENCOUNTER — Other Ambulatory Visit: Payer: Medicare Other

## 2017-04-17 ENCOUNTER — Other Ambulatory Visit: Payer: Self-pay | Admitting: Hematology and Oncology

## 2017-04-17 VITALS — BP 129/67 | HR 80 | Temp 98.0°F | Resp 20

## 2017-04-17 DIAGNOSIS — C561 Malignant neoplasm of right ovary: Secondary | ICD-10-CM | POA: Diagnosis not present

## 2017-04-17 DIAGNOSIS — C569 Malignant neoplasm of unspecified ovary: Secondary | ICD-10-CM

## 2017-04-17 LAB — CBC WITH DIFFERENTIAL/PLATELET
Basophils Absolute: 0.1 10*3/uL (ref 0–0.1)
Basophils Relative: 1 %
Eosinophils Absolute: 0 10*3/uL (ref 0–0.7)
Eosinophils Relative: 0 %
HCT: 27.5 % — ABNORMAL LOW (ref 35.0–47.0)
Hemoglobin: 8.9 g/dL — ABNORMAL LOW (ref 12.0–16.0)
Lymphocytes Relative: 6 %
Lymphs Abs: 1.3 10*3/uL (ref 1.0–3.6)
MCH: 28.5 pg (ref 26.0–34.0)
MCHC: 32.5 g/dL (ref 32.0–36.0)
MCV: 87.9 fL (ref 80.0–100.0)
Monocytes Absolute: 2 10*3/uL — ABNORMAL HIGH (ref 0.2–0.9)
Monocytes Relative: 9 %
Neutro Abs: 19.1 10*3/uL — ABNORMAL HIGH (ref 1.4–6.5)
Neutrophils Relative %: 84 %
Platelets: 276 10*3/uL (ref 150–440)
RBC: 3.13 MIL/uL — ABNORMAL LOW (ref 3.80–5.20)
RDW: 20.6 % — ABNORMAL HIGH (ref 11.5–14.5)
WBC: 22.6 10*3/uL — ABNORMAL HIGH (ref 3.6–11.0)

## 2017-04-17 LAB — MAGNESIUM: Magnesium: 1.3 mg/dL — ABNORMAL LOW (ref 1.7–2.4)

## 2017-04-17 MED ORDER — MAGNESIUM SULFATE 50 % IJ SOLN
6.0000 g | Freq: Once | INTRAMUSCULAR | Status: AC
  Administered 2017-04-17: 6 g via INTRAVENOUS
  Filled 2017-04-17: qty 2

## 2017-04-17 MED ORDER — HEPARIN SOD (PORK) LOCK FLUSH 100 UNIT/ML IV SOLN
500.0000 [IU] | Freq: Once | INTRAVENOUS | Status: AC
  Administered 2017-04-17: 500 [IU] via INTRAVENOUS
  Filled 2017-04-17: qty 5

## 2017-04-17 MED ORDER — SODIUM CHLORIDE 0.9 % IV SOLN
Freq: Once | INTRAVENOUS | Status: AC
  Administered 2017-04-17: 10:00:00 via INTRAVENOUS
  Filled 2017-04-17: qty 1000

## 2017-04-17 MED ORDER — SODIUM CHLORIDE 0.9% FLUSH
10.0000 mL | INTRAVENOUS | Status: DC | PRN
  Administered 2017-04-17: 10 mL via INTRAVENOUS
  Filled 2017-04-17: qty 10

## 2017-04-17 NOTE — Progress Notes (Signed)
Magnesium: 1.3. Honor Loh, NP, notified and aware. Per NP order: proceed with releasing the order in supportive therapy plan for Magnesium Sulfate 6g IV over 3 hours once now.

## 2017-04-18 ENCOUNTER — Other Ambulatory Visit: Payer: Self-pay | Admitting: Urgent Care

## 2017-04-18 ENCOUNTER — Other Ambulatory Visit: Payer: Self-pay | Admitting: *Deleted

## 2017-04-18 DIAGNOSIS — C569 Malignant neoplasm of unspecified ovary: Secondary | ICD-10-CM

## 2017-04-19 ENCOUNTER — Telehealth: Payer: Self-pay | Admitting: *Deleted

## 2017-04-19 NOTE — Telephone Encounter (Signed)
Awoke this morning to find that her port which has not been accessed since Monday had been bleeding. She estimates approximately an ounce of blood seeped from site. It has now stopped and she has a dressing on it. Please advise.

## 2017-04-19 NOTE — Telephone Encounter (Signed)
I spoke with B Pearline Cables, NP who said she can come in for it to be looked at if she wants, otherwise, not overly concerned since the bleeding has stopped.  Patient does not wish to come in and will call back if bleeding starts again I asked her to leave dressing on until tomorrow

## 2017-04-21 ENCOUNTER — Inpatient Hospital Stay: Payer: Medicare Other

## 2017-04-21 DIAGNOSIS — C569 Malignant neoplasm of unspecified ovary: Secondary | ICD-10-CM

## 2017-04-21 DIAGNOSIS — C561 Malignant neoplasm of right ovary: Secondary | ICD-10-CM | POA: Diagnosis not present

## 2017-04-21 LAB — BASIC METABOLIC PANEL
Anion gap: 8 (ref 5–15)
BUN: 14 mg/dL (ref 6–20)
CO2: 24 mmol/L (ref 22–32)
Calcium: 8.9 mg/dL (ref 8.9–10.3)
Chloride: 98 mmol/L — ABNORMAL LOW (ref 101–111)
Creatinine, Ser: 0.7 mg/dL (ref 0.44–1.00)
GFR calc Af Amer: >60 mL/min (ref >60)
GFR calc non Af Amer: >60 mL/min (ref >60)
Glucose, Bld: 315 mg/dL — ABNORMAL HIGH (ref 65–99)
Potassium: 4.3 mmol/L (ref 3.5–5.1)
Sodium: 130 mmol/L — ABNORMAL LOW (ref 135–145)

## 2017-04-21 LAB — CBC WITH DIFFERENTIAL/PLATELET
Basophils Absolute: 0.2 10*3/uL — ABNORMAL HIGH (ref 0–0.1)
Basophils Relative: 1 %
Eosinophils Absolute: 0 10*3/uL (ref 0–0.7)
Eosinophils Relative: 0 %
HCT: 28.3 % — ABNORMAL LOW (ref 35.0–47.0)
Hemoglobin: 9 g/dL — ABNORMAL LOW (ref 12.0–16.0)
Lymphocytes Relative: 4 %
Lymphs Abs: 0.7 10*3/uL — ABNORMAL LOW (ref 1.0–3.6)
MCH: 28.3 pg (ref 26.0–34.0)
MCHC: 31.9 g/dL — ABNORMAL LOW (ref 32.0–36.0)
MCV: 88.9 fL (ref 80.0–100.0)
Monocytes Absolute: 0.9 10*3/uL (ref 0.2–0.9)
Monocytes Relative: 5 %
Neutro Abs: 16.6 10*3/uL — ABNORMAL HIGH (ref 1.4–6.5)
Neutrophils Relative %: 90 %
Platelets: 264 10*3/uL (ref 150–440)
RBC: 3.18 MIL/uL — ABNORMAL LOW (ref 3.80–5.20)
RDW: 21.9 % — ABNORMAL HIGH (ref 11.5–14.5)
WBC: 18.3 10*3/uL — ABNORMAL HIGH (ref 3.6–11.0)

## 2017-04-21 LAB — MAGNESIUM: Magnesium: 1.4 mg/dL — ABNORMAL LOW (ref 1.7–2.4)

## 2017-04-21 MED ORDER — SODIUM CHLORIDE 0.9 % IV SOLN
INTRAVENOUS | Status: DC
  Administered 2017-04-21: 12:00:00 via INTRAVENOUS
  Filled 2017-04-21: qty 1000

## 2017-04-21 MED ORDER — HEPARIN SOD (PORK) LOCK FLUSH 100 UNIT/ML IV SOLN
500.0000 [IU] | Freq: Once | INTRAVENOUS | Status: AC
  Administered 2017-04-21: 500 [IU] via INTRAVENOUS
  Filled 2017-04-21: qty 5

## 2017-04-21 MED ORDER — SODIUM CHLORIDE 0.9% FLUSH
10.0000 mL | Freq: Once | INTRAVENOUS | Status: AC
  Administered 2017-04-21: 10 mL via INTRAVENOUS
  Filled 2017-04-21: qty 10

## 2017-04-21 MED ORDER — MAGNESIUM SULFATE 50 % IJ SOLN
6.0000 g | Freq: Once | INTRAMUSCULAR | Status: AC
  Administered 2017-04-21: 6 g via INTRAVENOUS
  Filled 2017-04-21: qty 10

## 2017-04-24 ENCOUNTER — Inpatient Hospital Stay: Payer: Medicare Other

## 2017-04-24 DIAGNOSIS — C561 Malignant neoplasm of right ovary: Secondary | ICD-10-CM | POA: Diagnosis not present

## 2017-04-24 DIAGNOSIS — C569 Malignant neoplasm of unspecified ovary: Secondary | ICD-10-CM

## 2017-04-24 LAB — CBC WITH DIFFERENTIAL/PLATELET
Basophils Absolute: 0.1 10*3/uL (ref 0–0.1)
Basophils Relative: 1 %
Eosinophils Absolute: 0 10*3/uL (ref 0–0.7)
Eosinophils Relative: 0 %
HCT: 27.1 % — ABNORMAL LOW (ref 35.0–47.0)
Hemoglobin: 9 g/dL — ABNORMAL LOW (ref 12.0–16.0)
Lymphocytes Relative: 10 %
Lymphs Abs: 0.9 10*3/uL — ABNORMAL LOW (ref 1.0–3.6)
MCH: 29.2 pg (ref 26.0–34.0)
MCHC: 33.1 g/dL (ref 32.0–36.0)
MCV: 88.1 fL (ref 80.0–100.0)
Monocytes Absolute: 0.6 10*3/uL (ref 0.2–0.9)
Monocytes Relative: 6 %
Neutro Abs: 7.6 10*3/uL — ABNORMAL HIGH (ref 1.4–6.5)
Neutrophils Relative %: 83 %
Platelets: 243 10*3/uL (ref 150–440)
RBC: 3.08 MIL/uL — ABNORMAL LOW (ref 3.80–5.20)
RDW: 21.4 % — ABNORMAL HIGH (ref 11.5–14.5)
WBC: 9.2 10*3/uL (ref 3.6–11.0)

## 2017-04-24 LAB — BASIC METABOLIC PANEL
Anion gap: 5 (ref 5–15)
BUN: 15 mg/dL (ref 6–20)
CO2: 26 mmol/L (ref 22–32)
Calcium: 8.9 mg/dL (ref 8.9–10.3)
Chloride: 103 mmol/L (ref 101–111)
Creatinine, Ser: 0.6 mg/dL (ref 0.44–1.00)
GFR calc Af Amer: >60 mL/min (ref >60)
GFR calc non Af Amer: >60 mL/min (ref >60)
Glucose, Bld: 131 mg/dL — ABNORMAL HIGH (ref 65–99)
Potassium: 4 mmol/L (ref 3.5–5.1)
Sodium: 134 mmol/L — ABNORMAL LOW (ref 135–145)

## 2017-04-24 LAB — MAGNESIUM: Magnesium: 1.5 mg/dL — ABNORMAL LOW (ref 1.7–2.4)

## 2017-04-24 MED ORDER — SODIUM CHLORIDE 0.9 % IV SOLN
INTRAVENOUS | Status: AC
  Administered 2017-04-24: 08:00:00 via INTRAVENOUS
  Filled 2017-04-24: qty 1000

## 2017-04-24 MED ORDER — HEPARIN SOD (PORK) LOCK FLUSH 100 UNIT/ML IV SOLN
500.0000 [IU] | Freq: Once | INTRAVENOUS | Status: AC
  Administered 2017-04-24: 500 [IU] via INTRAVENOUS
  Filled 2017-04-24: qty 5

## 2017-04-24 MED ORDER — SODIUM CHLORIDE 0.9 % IV SOLN
6.0000 g | Freq: Once | INTRAVENOUS | Status: AC
  Administered 2017-04-24: 6 g via INTRAVENOUS
  Filled 2017-04-24: qty 12

## 2017-04-24 MED ORDER — SODIUM CHLORIDE 0.9% FLUSH
10.0000 mL | Freq: Once | INTRAVENOUS | Status: AC | PRN
  Administered 2017-04-24: 10 mL
  Filled 2017-04-24: qty 10

## 2017-04-28 ENCOUNTER — Inpatient Hospital Stay: Payer: Medicare Other

## 2017-04-28 DIAGNOSIS — C569 Malignant neoplasm of unspecified ovary: Secondary | ICD-10-CM

## 2017-04-28 DIAGNOSIS — C561 Malignant neoplasm of right ovary: Secondary | ICD-10-CM | POA: Diagnosis not present

## 2017-04-28 LAB — BASIC METABOLIC PANEL
Anion gap: 8 (ref 5–15)
BUN: 19 mg/dL (ref 6–20)
CO2: 27 mmol/L (ref 22–32)
Calcium: 9.4 mg/dL (ref 8.9–10.3)
Chloride: 99 mmol/L — ABNORMAL LOW (ref 101–111)
Creatinine, Ser: 0.64 mg/dL (ref 0.44–1.00)
GFR calc Af Amer: >60 mL/min (ref >60)
GFR calc non Af Amer: >60 mL/min (ref >60)
Glucose, Bld: 217 mg/dL — ABNORMAL HIGH (ref 65–99)
Potassium: 4.4 mmol/L (ref 3.5–5.1)
Sodium: 134 mmol/L — ABNORMAL LOW (ref 135–145)

## 2017-04-28 LAB — MAGNESIUM: Magnesium: 1.3 mg/dL — ABNORMAL LOW (ref 1.7–2.4)

## 2017-04-28 MED ORDER — SODIUM CHLORIDE 0.9 % IV SOLN
INTRAVENOUS | Status: DC
  Administered 2017-04-28: 13:00:00 via INTRAVENOUS
  Filled 2017-04-28: qty 1000

## 2017-04-28 MED ORDER — HEPARIN SOD (PORK) LOCK FLUSH 100 UNIT/ML IV SOLN
500.0000 [IU] | Freq: Once | INTRAVENOUS | Status: AC
  Administered 2017-04-28: 500 [IU] via INTRAVENOUS
  Filled 2017-04-28: qty 5

## 2017-04-28 MED ORDER — SODIUM CHLORIDE 0.9 % IV SOLN: 6.0000 g | Freq: Once | INTRAVENOUS | Status: DC

## 2017-04-28 MED ORDER — MAGNESIUM SULFATE 2 GM/50ML IV SOLN
2.0000 g | Freq: Once | INTRAVENOUS | Status: AC
  Administered 2017-04-28: 2 g via INTRAVENOUS
  Filled 2017-04-28: qty 50

## 2017-04-28 MED ORDER — MAGNESIUM SULFATE 4 GM/100ML IV SOLN
4.0000 g | Freq: Once | INTRAVENOUS | Status: AC
  Administered 2017-04-28: 4 g via INTRAVENOUS
  Filled 2017-04-28: qty 100

## 2017-04-30 NOTE — Progress Notes (Signed)
[facility] day:  05/01/2017   Chief Complaint: [name] is a 71 y.o. female with stage IV ovarian cancer who is seen for assessment prior to day 1 of cycle #3 Abraxane.  HPI:  The patient was last seen in the medical oncology clinic on 04/11/2017.  At that time, patient had been doing well overall. [name] had some transient nausea following [name] chemotherapy treatment, however it was managed with the prescribed interventions. Patient eating, yet had lost 3 pounds. 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 Exam was stable. WBC was 4200 with an [place] of 2700. Hemoglobin was 9.4, hematocrit 27.9, and platelets 299,000. Magnesium was low at 1.3. [name] received day 15 of cycle #2 Abraxane with OnPro Naulasta support, and 6 grams of intravenous magnesium replacement.   Repeat labs were done on 04/14/2017. WBC 27,000 with and ANC of 25,800. Hemoglobin was 8.7, hematocrit 27.0, and platelets 281,000. Magnesium was low at 1.5. [name] received 6 grams of intravenous magnesium replacement.   Repeat labs were done on 04/17/2017. WBC 22,600 with an [place] of 19,100. Hemoglobin was 8.9, hematocrit 27.5, and platelets 276,000. Magnesium was low at 1.3. [name] received 6 grams of intravenous magnesium replacement.   Patient returned a call to the clinic on 04/19/2017 to report some minor bleeding from [name] port. [name] was offered a clinic visit for evaluation, however [name] declined. [name] was instructed to cover with a dressing and to call back if the bleeding did not resolve. No further communication was received regarding [name] port.   Repeat labs were done on 04/21/2017. WBC 18,300 with an ANC of 16,600. Hemoglobin was 9.0, hematocrit 28.3, and platelets 264,000. Magnesium was low at 1.4. [name] received 6 grams of intravenous magnesium replacement.  Repeat labs were done on 04/24/2017. WBC 9,200 with an ANC of 7,600. Hemoglobin  was 9.0, hematocrit 27.1, and platelets 243,000. Magnesium was low at 1.5. [name] received 6 grams of intravenous magnesium replacement.   Repeat labs were done on 04/28/2017. Magnesium was low at 1.3. [name] received 6 grams of intravenous magnesium replacement.   In the interim, patient has been doing "ok". [name] describes a "good holiday". [name] has been "industrious". [name] states, "I walked [facility]" over. [name] denies any B symptoms or interval infections. Patient complains of increased neuropathy in both [name] hands and feet.  [name] drops things once in awhile.  [name] states, "I can still function. I do everything that I have been doing". Patient eating well, however [name] has lost 3 pounds.    Past Medical History:  Diagnosis Date  . CAD (coronary artery disease)    a. 09/2012 Cath: LM nl, LAD 95p, 35m LCX 954mOM2 50, RCA 100.  . Marland KitchenHF (congestive heart failure) ([facility]  . Cholelithiasis   . Collagen vascular disease (HCC)    Rheumatoid Arthritis.  . Esophageal stricture   . Exertional shortness of breath   . GERD (gastroesophageal reflux disease)   . H/O hiatal hernia   . Herniated disc   . History of pancytopenia   . Hyperlipidemia   . Hypertension   . Hypokalemia   . Leukopenia 2012   s/p bone marrow biopsy, Dr. PaMa Hillock. NSTEMI (non-ST elevated myocardial infarction) (HCAlvin5/2014   "mild" (10/18/2012)  . Ovarian cancer ([facility] [place]2016   chemo and hysterectomy  . Pneumonia 2013; 08/2012   "one lung; double" (10/18/2012)  . Rheumatoid arthritis(714.0)   .  Type I diabetes mellitus ([place])    "dx'd in 1957" (10/18/2012)    Past Surgical History:  Procedure Laterality Date  . ABDOMINAL HYSTERECTOMY  06/15/2015   Dx L/S, EXLAP TAH BSO omentectomy RSRx colostomy diaphragm resection stripping  . CARDIAC CATHETERIZATION  10/18/2012   "first one was today" (10/18/2012)  . CATARACT EXTRACTION W/ INTRAOCULAR LENS IMPLANT Right 2010  . CHOLECYSTECTOMY  06/15/2015   combined case with ovarian cancer  debulking  . COLON SURGERY    . CORONARY ARTERY BYPASS GRAFT N/A 10/19/2012   Procedure: CORONARY ARTERY BYPASS GRAFTING (CABG);  Surgeon: Melrose Nakayama, MD;  Location: [facility];  Service: Open Heart Surgery;  Laterality: N/A;  . ESOPHAGEAL DILATION     "3 or 4 times" (10/19/2011)  . ESOPHAGOGASTRODUODENOSCOPY  2012   Dr. Minna Merritts  . OSTOMY    . OVARY SURGERY     removal  . PERIPHERAL VASCULAR CATHETERIZATION N/A 03/02/2015   Procedure: Glori Luis Cath Insertion;  Surgeon: Algernon Huxley, MD;  Location: [facility];  Service: Cardiovascular;  Laterality: N/A;  . TUBAL LIGATION  1970    Family History  Problem Relation Age of Onset  . Diabetes Mother   . Arthritis Mother   . Diabetes Father   . Arthritis Father   . Aneurysm Sister        neck  . Cancer Sister        lung    Social History:  reports that [name] quit smoking about 22 years ago. [name] smoking use included cigarettes. [name] has a 30.00 pack-year smoking history. [name] has never used smokeless tobacco. [name] reports that [name] does not drink alcohol or use drugs.  Patient is a retired Art therapist. Patient denies exposure to radiation and toxins. [name] is accompanied by [name] husband today.  Allergies:  Allergies  Allergen Reactions  . Codeine Nausea And Vomiting  . Latex Rash    Current Medications: Current Outpatient Medications  Medication Sig Dispense Refill  . Calcium-Vitamin D 600-200 MG-UNIT tablet Take 1 tablet by mouth 2 (two) times daily.     . cholecalciferol (VITAMIN D) 1000 units tablet Take 1,000 Units by mouth daily.    . DULoxetine (CYMBALTA) 20 MG capsule Take by mouth.    . enoxaparin (LOVENOX) 60 MG/0.6ML injection INJECT 0.6 ML (60 MG TOTAL) INTO THE SKIN EVERY 12 HOURS 60 mL 3  . folic acid (FOLVITE) 1 MG tablet Take 1 tablet (1 mg total) by mouth daily. 90 tablet 3  . furosemide (LASIX) 40 MG tablet Take 1 tablet (40 mg total) by mouth daily as needed for edema. 30 tablet 1  . insulin lispro  (HUMALOG) 100 UNIT/ML injection Inject 0.06 mLs (6 Units total) into the skin daily. (Patient taking differently: Inject 0-85 Units into the skin daily. Via insulin pump) 30 mL 3  . lidocaine-prilocaine (EMLA) cream APPLY 1 APPLICATION TOPICALLY AS NEEDED 1 HOUR PRIOR TO TREATMENT. COVER WITH PRESS N SEAL UNTIL TREATMENT TIME AS DIRECTED 30 g 1  . loratadine (CLARITIN) 10 MG tablet Take 1 tablet by mouth daily as needed.    . magnesium oxide (MAG-OX) 400 (241.3 Mg) MG tablet TAKE 1 TABLET BY MOUTH ONCE DAILY 30 tablet 1  . metoprolol tartrate (LOPRESSOR) 25 MG tablet Take 1 tablet (25 mg total) by mouth 2 (two) times daily. 180 tablet 3  . ondansetron (ZOFRAN) 8 MG tablet TAKE 1 TABLET BY MOUTH EVERY 8 HOURS AS NEEDED FOR NAUSEA OR VOMITING 30 tablet  1  . pantoprazole (PROTONIX) 40 MG tablet TAKE 1 TABLET BY MOUTH TWICE (2) DAILY 180 tablet 1  . potassium chloride SA (K-DUR,KLOR-CON) 20 MEQ tablet TAKE 1 TABLET BY MOUTH ONCE A DAY 90 tablet 1  . predniSONE (DELTASONE) 5 MG tablet Take 5 mg by mouth daily with breakfast.    . vitamin C (ASCORBIC ACID) 500 MG tablet Take 500 mg by mouth daily.    Marland Kitchen zinc gluconate 50 MG tablet Take 50 mg daily by mouth.     Current Facility-Administered Medications  Medication Dose Route Frequency Provider Last Rate Last Dose  . Tbo-Filgrastim (GRANIX) injection 300 mcg  300 mcg Subcutaneous Once Lequita Asal, MD       Facility-Administered Medications Ordered in Other Visits  Medication Dose Route Frequency Provider Last Rate Last Dose  . 0.9 %  sodium chloride infusion   Intravenous Continuous Lequita Asal, MD   Stopped at 04/24/17 1140  . heparin lock flush 100 unit/mL  500 Units Intravenous Once Corcoran, Melissa C, MD      . magnesium sulfate IVPB 4 g 100 mL  4 g Intravenous Once Faythe Casa E, NP      . sodium chloride 0.9 % injection 10 mL  10 mL Intracatheter PRN Corcoran, Melissa C, MD      . sodium chloride 0.9 % injection 10 mL  10 mL  Intravenous PRN Lequita Asal, MD   10 mL at 04/13/15 0852  . sodium chloride flush (NS) 0.9 % injection 10 mL  10 mL Intravenous PRN Lequita Asal, MD   10 mL at 05/01/17 3710    Review of Systems:  GENERAL:  Feels "well".  No fever, chills or sweats.  Weight down 3 pounds. PERFORMANCE STATUS (ECOG):  1 HEENT:  No visual changes, sore throat, mouth sores or tenderness. Lungs:  No shortness of breath.  No cough.  No hemoptysis. Cardiac:  No chest pain, palpitations, orthopnea, or PND. GI:  No nausea, vomiting, diarrhea, melena or hematochezia. GU:  No urgency, frequency, dysuria, or hematuria. Musculoskeletal:  Back pain.  Severe rheumatoid arthritis (on prednisone).  Osteoporosis.  No back pain. No muscle tenderness. Extremities:  No pain or swelling. Skin:  No increased bruising or bleeding.  No rashes or skin changes. Neuro:  Neuropathy in hands and feet (increased- see HPI).  No headache, numbness or weakness, balance or coordination issues. Endocrine:  Diabetes on an insulin pump.  Blood sugar elevated on steroids.  Occasional hot flashes.  Psych:  No mood changes, depression or anxiety. Pain:  No pain. Review of systems:  All other systems reviewed and found to be negative.  Physical Exam:  Blood pressure 125/69, pulse 79, temperature (!) 97 F (36.1 C), temperature source Tympanic, resp. rate 16, weight 128 lb 11.2 oz (58.4 kg). GENERAL:  Well developed, well nourished woman sitting comfortably in the exam room in no acute distress.  [name] has a cane at [name] side. MENTAL STATUS:  Alert and oriented to person, place and time. HEAD:  Wearing a light blue cap.  Alopecia.  Normocephalic, atraumatic, face symmetric, no Cushingoid features. EYES:  Gold rimmed glasses.  Blue eyes.  No conjunctivitis or scleral icterus.  ENT: Oropharynx clear without lesion. Tongue normal. Mucous membranes moist.  RESPIRATORY: Decreased breath sounds right base.  No wheezes or  rhonchi. CARDIOVASCULAR: Regular rate and rhythm without murmur, rub or gallop. ABDOMEN: Soft, , non-tender with active bowel sounds and no appreciable hepatosplenomegaly. No palpable  nodularity or masses.  Insulin pump.  Ostomy bag. SKIN: No rashes or ulcers. EXTREMITIES:  No edema, skin discoloration or tenderness. No palpable cords. LYMPH NODES: No palpable cervical, supraclavicular, axillary or inguinal adenopathy  NEUROLOGICAL: Appropriate. PSYCH: Appropriate.   Radiology studies: 02/13/2015:  Abdomen and pelvic CT revealed bilateral mass-like adnexal regions (right adnexal mass 5.6 x 5.0 cm and the left adnexa mass 10.3 x 6.0 x 9.9 cm).  There was a large amount of soft tissue throughout the peritoneal cavity involving the omentum and other peritoneal surfaces.  There was a small volume ascites. There was a peripheral 3.3 x 1.9 cm low-attenuation lesion overlying the right lobe of the liver, likely representing a serosal implant. There was 1.4 x 2.2 cm ill-defined peripheral lesion within the inferior aspect of segment 6 adjacent to the ampulla in the duodenum.  04/28/2015:  Abdominal and pelvic CT revealed decreasing bilateral ovarian masses.   The left adnexal mass measured 4.0 x 6.1 cm (previously 5.0 x 8.5 cm). The right ovary measured 3.8 x 4.9 cm (previously 4.7 x 5.6 cm).  There was improved peritoneal carcinomatosis.  There was a small amount of ascites.  The hepatic dome lesion was stable. The previously seen right hepatic lobe lesion was not well-visualized.  There was a small left pleural effusion and trace right pleural effusion.  The nodular lesion at the ampulla of Vater, extending into the duodenum was stable.  There was no biliary ductal dilatation. 08/01/2015:  Rght upper extremity ultrasound revealed a near occlusive thrombus within the central portion of the right internal jugular vein and central portion of the right subclavian vein. 09/01/2015:  Chest, abdomen, and  pelvic CT revealed continued decrease in perihepatic fluid collection contiguous with the right pleural space with percutaneous drain.  09/11/2015:  Chest, abdomen, and pelvic CT revealed a residual versus recurrent fluid collection posterior to the right hepatic lobe (2.6 x 1.1 x 4.0 cm).   10/13/2015:  Chest, abdomen, and pelvic CT revealed resolution of the empyema. 02/15/2016:  Chest, abdomen, and pelvic CT revealed multiple soft tissue nodules throughout the pelvis, small bowel mesenteric and peritoneum and, concerning for peritoneal metastatic disease.  There was soft tissue irregularity within the left upper quadrant.  There was mild right hydronephrosis (etiology unclear).  There was interval resolution of previously described right pleural based fluid and gas collection. There was a pleural based nodule within the right lower hemi-thorax concerning for pleural based metastasis.  There were small bilateral pleural effusions.  There was pulmonary nodularity, predominately within the left upper lobe (metastatic or infectious/inflammatory etiology).  There was slightly increased mediastinal adenopathy (infectious/inflammatory or metastatic).  There was a low attenuation lesion within the left hepatic lobe (complicated fluid within the fissure or metastatic disease). 03/08/2016:  Abdomen and pelvic CT revealed mild progression of peritoneal disease in the abdomen.  There was interval increase in loculated fluid around the lateral segment left liver, stomach, and spleen.  There was persistent soft tissue lesion at the level of the ampulla with mild intra and extrahepatic biliary duct dilatation.  There was persistent intrahepatic and capsular metastatic disease involving the liver. 05/26/2016:  Head MRI revealed multiple small areas of acute infarct involving the occipital parietal lobe bilaterally and the left lateral cerebellum,  consistent with posterior circulation emboli.  06/10/2016:  Adomen and  pelvic CT revealed progression of peritoneal carcinomatosis predominantly in the perihepatic space, bilateral lower quadrants and pelvis.  There was worsening bilateral obstructive uropathy due to malignant  involvement of the pelvic ureters.  There was stable small pleural/subpleural nodules at the right lung base.  There was stable small left pleural effusion.  There was decreased small volume perihepatic ascites.  08/09/2016:  Abdomen and pelvic CT revealed interval increase and loculated fluid collections within the peritoneal space consistent with progression of peritoneal ovarian carcinoma metastasis.  There was one large 7 cm collection in the RIGHT lower quadrant which had increased significantly in size and may represent of point of small bowel obstruction.  The proximal stomach and duodenum were decompressed. The mid small bowel was mildly dilated. There was concern for obstruction given the large intraperitoneal fluid collection.  There was increase in subcapsular hepatic metastatic fluid collections.  There was enlargement of rounded ampullary lesion in the second portion duodenum concerning for metastatic lesion.  There was mild biliary duct dilatation (stable).  There was nodular pleural metastasis in the RIGHT lower lobe pleural space. 11/03/2016:  Abdomen and pelvic CT revealed multifocal cystic metastases in the abdomen/pelvis, reflecting peritoneal disease.  The dominant lesion in the left anterior abdomen has progressed, while additional lesions were mixed.  Peritoneal disease along the liver and spleen progressed.  Pleural-based metastases along the posterior right hemithorax progressed. There were trace left pleural effusion.  There was a stable 1.7 cm duodenal lesion at the ampulla. 11/29/2016:  Abdomen and pelvic CT revealed mild interval enlargement of pleural metastasis the RIGHT lower lobe.  There was overall mild decrease in cystic peritoneal metastasis within the abdomen pelvis. The  solid lesion at the terminal ileum was decreased in size. Subcapsular lesion in the liver was slightly decreased in size.  There was no evidence of disease progression the abdomen pelvis.  There was mild intrahepatic duct dilatation similar prior.  The ampullary lesion was slightly larger.  There was mild hydronephrosis and hydroureter on the LEFT likely related to cystic metastasis at the LEFT vesicoureteral junction 12/09/2016:  MRCP revealed the cystic lesion within the pancreatic head/uncinate process had decreased in size and demonstrated no suspicious characteristics. This can be presumed a pseudocyst or focus of side branch duct ectasia.  There was similar to slight progression of extensive peritoneal metastasis since 11/29/2016. Grossly similar abdominal nodal and right pleural metastasis.  There was chronic mild common duct dilatation with similar soft tissue fullness about the ampulla compared to 11/29/2016.  There was improved to resolved left-sided hydronephrosis. 01/20/2017:  Abdomen and pelvic CT revealed complex mixed interval changes in widespread metastatic disease in the peritoneal cavity, lower chest and thoracic, abdominal and pelvic lymph nodes. Right lower chest pleural metastasis and several of the peritoneal tumor implants had increased.  There was new right inguinal lymphadenopathy.  Some of the peritoneal tumor implants had decreased.  The small dependent left pleural effusion was slightly increased .  The mild left hydroureteronephrosis was improved.  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   The chronic biliary ductal dilatation was stable .  There was no evidence of bowel obstruction.  Admissions: [facility] from 06/15/2015 - 06/22/2015.  [name] underwent exploratory laparotomy, lysis of adhesions, total abdominal hysterectomy with bilateral salpingo-oophorectomy, infracolic omentectomy, optimal tumor debulking(< 1 cm), recto-sigmoid resection with creation of end colostomy,  cholecystectomy, mobilization of splenic flexure and liver with diaphragmatic stripping on 06/15/2015. The right diaphragm was cleared of tumor. During dissection, the diaphragm was entered and closed with sutures.  [place] from 07/01/2015 - 07/03/2015 with pleural effusion. [place] from 07/07/2015 - 07/09/2015 with recurrent pleural effusion. [place] from 07/29/2015 - 08/10/2015  with a right-sided empyema and liver abscess. [name] underwent CT-guided placement of a liver abscess drain on 07/30/2015.  Liver abscess culture grew out group B strep and Enterobacter which was sensitive to Zosyn. [name] was transitioned to ertapenem Colbert Ewing) prior to discharge.  [name] was readmitted to [facility] on 09/17/2015. [place] from 05/26/2016 - 05/28/2016 with altered mental status and an acute embolic CVA.  Head MRI on 05/26/2016 revealed multiple small areas of acute infarct involving the occipital parietal lobe bilaterally and the left lateral cerebellum,  consistent with posterior circulation emboli.  Work-up included a negative carotid ultrasound and echocardiogram. [facility] from 08/10/2016 - 08/14/2016 with a small bowel obstruction.  [name] was managed conservatively. [place] from 09/27/2016 - 09/28/2016 with symptomatic anemia and diarrhea.   Infusion on 05/01/2017  Component Date Value Ref Range Status  . Magnesium 05/01/2017 1.3* 1.7 - 2.4 mg/dL Final   Performed at [facility], [address] [place], [place] [address]  . WBC 05/01/2017 4.6  3.6 - 11.0 K/uL Final  . RBC 05/01/2017 2.99* 3.80 - 5.20 MIL/uL Final  . Hemoglobin 05/01/2017 8.7* 12.0 - 16.0 g/dL Final  . HCT 05/01/2017 26.5* 35.0 - 47.0 % Final  . MCV 05/01/2017 88.4  80.0 - 100.0 fL Final  . MCH 05/01/2017 29.0  26.0 - 34.0 pg Final  . MCHC 05/01/2017 32.8  32.0 - 36.0 g/dL Final  . RDW 05/01/2017 20.8* 11.5 - 14.5 % Final  . Platelets 05/01/2017 293  150 - 440 K/uL Final  . Neutrophils Relative % 05/01/2017 69  % Final  . Neutro Abs 05/01/2017 3.2  1.4 -  6.5 K/uL Final  . Lymphocytes Relative 05/01/2017 16  % Final  . Lymphs Abs 05/01/2017 0.7* 1.0 - 3.6 K/uL Final  . Monocytes Relative 05/01/2017 13  % Final  . Monocytes Absolute 05/01/2017 0.6  0.2 - 0.9 K/uL Final  . Eosinophils Relative 05/01/2017 0  % Final  . Eosinophils Absolute 05/01/2017 0.0  0 - 0.7 K/uL Final  . Basophils Relative 05/01/2017 2  % Final  . Basophils Absolute 05/01/2017 0.1  0 - 0.1 K/uL Final   Performed at [facility], [address], [place], [place] [address]  . Sodium 05/01/2017 135  135 - 145 mmol/L Final  . Potassium 05/01/2017 4.1  3.5 - 5.1 mmol/L Final  . Chloride 05/01/2017 100* 101 - 111 mmol/L Final  . CO2 05/01/2017 28  22 - 32 mmol/L Final  . Glucose, Bld 05/01/2017 224* 65 - 99 mg/dL Final  . BUN 05/01/2017 21* 6 - 20 mg/dL Final  . Creatinine, Ser 05/01/2017 0.75  0.44 - 1.00 mg/dL Final  . Calcium 05/01/2017 9.1  8.9 - 10.3 mg/dL Final  . Total Protein 05/01/2017 6.8  6.5 - 8.1 g/dL Final  . Albumin 05/01/2017 3.2* 3.5 - 5.0 g/dL Final  . AST 05/01/2017 13* 15 - 41 U/L Final  . ALT 05/01/2017 9* 14 - 54 U/L Final  . Alkaline Phosphatase 05/01/2017 78  38 - 126 U/L Final  . Total Bilirubin 05/01/2017 0.6  0.3 - 1.2 mg/dL Final  . GFR calc non Af Amer 05/01/2017 >60  >60 mL/min Final  . GFR calc Af Amer 05/01/2017 >60  >60 mL/min Final   Comment: (NOTE) The eGFR has been calculated using the CKD EPI equation. This calculation has not been validated in all clinical situations. eGFR's persistently <60 mL/min signify possible Chronic Kidney Disease.   . Anion gap 05/01/2017 7  5 -  15 Final   Performed at [facility], [address] [place], [place] [address]    Assessment:  BEANCA KIESTER is a 71 y.o. female with progressive stage IV ovarian cancer.  [name] presented with abdominal discomfort and bloating.  Omental biopsy on 02/23/2015 revealed metastatic high grade serous carcinoma, consistent with gynecologic origin.   [name]  was initially diagnosed with clinical stage IIIC (T3cN1Mx).  CA125 was 707 on 02/17/2015.  [name] received 4 cycles of neoadjuvant carboplatin and Taxol (03/05/2015 - 05/22/2015).  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  [name] had loose stools on oral magnesium.  [name] was initially on Neurontin then switched Lyrica with cycle #3.  Cycle #4 was notable for neutropenia (ANC 300) requiring GCSF x 3 days.    [name] underwent exploratory laparotomy, lysis of adhesions, total abdominal hysterectomy with bilateral salpingo-oophorectomy, infracolic omentectomy, optimal tumor debulking(< 1 cm), recto-sigmoid resection with creation of end colostomy, cholecystectomy, mobilization of splenic flexure and liver with diaphragmatic stripping on 06/15/2015. The right diaphragm was cleared of tumor. During dissection, the diaphragm was entered and closed with sutures.  [name] received tamoxifen from 02/25/2016 - 03/14/2016.  [name] received 4 cycles of carboplatin and gemcitabine (03/21/2016 - 05/24/2016) with GCSF/Neulasta support.  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  Cymbalta began on 02/24/2017  PDL-1 testing revealed a combined score was 5 (>=1 positive).     [facility] genetic testing negative for APC, ATM, BMPR1A, BRCA1, BRCA2, BRIP1, CDH1, CDK4, CDKN2A, CHEK2, EPCAM, MLH1, MSH2, MSH6, MUTYH, NBN, PALB2, PMS2, PTEN, RAD51C, RAD51D, SMAD4, STK11, TP53. Sequencing for select regions of POLE and POLD1, and large rearrangement analysis for select regions of GREM1 were negative.  [name] received 2 cycles of Doxil (06/21/2016 - 07/19/2016) with Neulasta support.  [name] received 4 cycles of topotecan with Neulasta support (08/22/2016 - 10/10/2016).    [name] received 3 cycles of pembrolizumab (Keytruda) from 12/06/2016 -  01/09/2017.  Cycle #1 was complicated by neutropenia (ANC 400) requiring GCSF/Granix x 3 days.  [name] received GCSF x 5 days with cycle #2.  Abdomen and pelvic CT  on 01/20/2017 revealed  complex mixed interval changes in widespread metastatic disease in the peritoneal cavity, lower chest and thoracic, abdominal and pelvic lymph nodes. Right lower chest pleural metastasis and several of the peritoneal tumor implants had increased.  There was new right inguinal lymphadenopathy.  Some of the peritoneal tumor implants had decreased.  The small dependent left pleural effusion was slightly increased .  The mild left hydroureteronephrosis was improved.  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   The chronic biliary ductal dilatation was stable .  There was no evidence of bowel obstruction.  CA125 was 802.9 on 03/30/2015, 567.9 on 04/13/2015, 168.8 on 05/15/2015, 85.2 on 07/17/2015, 68.6 on 07/28/2015, 34.5 on 09/17/2015, 20.7 on 11/06/2015, 49 on 01/22/2016, 106.1 on 02/22/2016, 138.8 on 03/07/2016, 220.8 on 03/21/2016, 152.8 on 04/11/2016, 125.6 on 04/18/2016, 76.8 on 05/03/2016, 60.2 on 05/24/2016, 44 on 07/19/2016, 65.8 on 08/17/2016, 53.4 on 09/12/2016, 47.3 on 10/10/2016, 91 on 12/20/2016, 141.1 on 01/09/2017, 463.7 on 02/27/2017, and 907.1 on 03/27/2017.  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  [name] underwent thoracentesis of 650 cc on post-operative day 3.  [name] was admitted to [facility] on 07/01/2015 and 07/07/2015 for recurrent shortness of breath.  [name] underwent 2 additional thoracenteses (1.1 L on 07/02/2015 and 850 cc on 07/08/2015).  Cytology was negative x 2.  Bilateral lower extremity duplex on 07/03/2015  was negative.  Echo revealed an EF of 55-60% on 07/08/2015 and 60-65% on 05/27/2016.    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 [name] was on Lovenox 60 mg twice a day.  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  [name] has severe rheumatoid arthritis.  Methotrexate and Enbrel were  initially on hold.  [name] has a normocytic anemia.  Work-up on 02/17/2015 and 05/24/2016 revealed a normal ferritin, B12, folate, TSH.  [name] denies any melena or hematochezia.    [name] has anemia due to chronic disease. [name] received 1 unit PRBCs during [name] admission at [facility]. [name] denies any melena or hematochezia. [name] has diabetes and is on an insulin pump.  [name] has had persistent neutropenia felt secondary to [name] rheumatoid arthritis.  Folate and MMA were normal.  TSH was 6.13 (high) with a free T4 of 1.17 (0.61-1.12).  [name] began methotrexate (10 mg a week) and prednisone (5 mg a day) for severe rheumatoid arthritis on 12/17/2015.  [name] remains on prednisone alone (5 mg a day).  Bone marrow aspirate and biopsy on 06/09/2010 revealed a hypercellular marrow (70%) with no evidence of dysplasia or malignancy.  Flow cytometry was negative.  Cytogenetics were normal (46,XX).  FISH studies were negative for MDS.  Bone marrow aspirate and biopsy on 12/03/2015 revealed a normocellular to mildy hypercellular marrow for age (40%) with left shifted myelopoiesis, non specific dyserythropoiesis and mild megakaryocytic atypia with no increase in blasts.  There were multiple small nonspecific lymphoid aggregates (favor reactive). There was no increase in reticulin.  There was decreased myeloid cells (37%) with left shifted maturation and 1% atypical myelod blasts.  There was relatively increased monocytic cells (11%), relatively increased lymphoid cells (36%), and relatively increased eosinophils (6%).  Cytogenetics were normal (64, XX).  SNP microarray was normal.  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  [name] receives IV magnesium weekly.  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  [name] has received Procrit (last 11/07/2016).  Labs on 08/22/2016 revealed a normal B12 and folate.  Ferritin was 37, iron saturation 11% and TIBC 281.  Code status is DNR/DNI.  [name] developed acute LFT elevation on 11/28/2016.  Event  was preceded by upper abdominal discomfort.  Hepatitis B core antibody total and hepatitis C antibody were negative.  [name] is s/p day 15 of cycle #2 Abraxane (04/11/2017).  Cycle #1 was truncated after day 1 secondary to side effects and Thanksgiving.  Symptomatically, [name] feels "good".  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  Exam is stable.  WBC is 4600 with an Gilmer of 3200. Hemoglobin is 8.7, hematocrit 26.5, and platelets 293,000.  Magnesium is 1.3 (low).   Plan: 1.   Labs today:  CBC with diff, CMP, Mg, CA125 2.   Blood count stable and adequate for treatment. Will proceed with day 1 of cycle #3 Abraxane.  3.   Magnesium low at 1.3. Will replace with 6 grams of IV magnesium.   4.   Discuss genetic testing and previous discussions about Vitrakvi. Patient does not have required mutations for this medication to be effective, therefore it is not a viable option for [name].  5.   Discss CA125.  Tumor marker continues to increase to 907.1. Discussed need for repeat imaging. Will schedule chest, abdomen and pelvis CT for 05/22/2017. 6.  RTC on 01/02, 01/03, and 01/04 for GCSF. 7.    RTC on 01/03 for labs (Mg) and Mg pump. Will return on 01/04 for pump disconnect.  8.    RTC on 05/08/2017 for MD assessment, labs, (CBC with diff, CMP, Mg), day 8 of cycle #3 Abraxane, and +/- IV Mg 9.    RTC on 01/09, 01/10, and 01/11 for GCSF. 10.  RTC on 05/12/2017 for labs (Mg) and +/- IV Mg. 11.  RTC on 05/15/2017 or MD assessment, labs, (CBC with diff, CMP, Mg), day 15 of cycle #3 Abraxane with OnPro Neulasta support, and +/- IV Mg   Honor Loh, NP  05/01/2017, 9:30 AM   I saw and evaluated the patient, participating in the key portions of the service and reviewing pertinent diagnostic studies and records.  I reviewed the nurse practitioner's note and agree with the findings and the plan.  The assessment and plan were discussed with the patient.  Several questions were asked by the patient and  answered.   Nolon Stalls, MD 05/01/2017,9:30 AM

## 2017-05-01 ENCOUNTER — Encounter: Payer: Self-pay | Admitting: Hematology and Oncology

## 2017-05-01 ENCOUNTER — Inpatient Hospital Stay: Payer: Medicare Other

## 2017-05-01 ENCOUNTER — Inpatient Hospital Stay (HOSPITAL_BASED_OUTPATIENT_CLINIC_OR_DEPARTMENT_OTHER): Payer: Medicare Other | Admitting: Hematology and Oncology

## 2017-05-01 ENCOUNTER — Other Ambulatory Visit: Payer: Self-pay | Admitting: Hematology and Oncology

## 2017-05-01 VITALS — BP 125/69 | HR 79 | Temp 97.0°F | Resp 16 | Wt 128.7 lb

## 2017-05-01 DIAGNOSIS — Z66 Do not resuscitate: Secondary | ICD-10-CM

## 2017-05-01 DIAGNOSIS — C801 Malignant (primary) neoplasm, unspecified: Secondary | ICD-10-CM

## 2017-05-01 DIAGNOSIS — I2510 Atherosclerotic heart disease of native coronary artery without angina pectoris: Secondary | ICD-10-CM

## 2017-05-01 DIAGNOSIS — Z79899 Other long term (current) drug therapy: Secondary | ICD-10-CM

## 2017-05-01 DIAGNOSIS — C569 Malignant neoplasm of unspecified ovary: Secondary | ICD-10-CM

## 2017-05-01 DIAGNOSIS — I252 Old myocardial infarction: Secondary | ICD-10-CM

## 2017-05-01 DIAGNOSIS — Z5111 Encounter for antineoplastic chemotherapy: Secondary | ICD-10-CM

## 2017-05-01 DIAGNOSIS — R634 Abnormal weight loss: Secondary | ICD-10-CM | POA: Diagnosis not present

## 2017-05-01 DIAGNOSIS — I509 Heart failure, unspecified: Secondary | ICD-10-CM | POA: Diagnosis not present

## 2017-05-01 DIAGNOSIS — E785 Hyperlipidemia, unspecified: Secondary | ICD-10-CM

## 2017-05-01 DIAGNOSIS — E109 Type 1 diabetes mellitus without complications: Secondary | ICD-10-CM

## 2017-05-01 DIAGNOSIS — Z7689 Persons encountering health services in other specified circumstances: Secondary | ICD-10-CM | POA: Diagnosis not present

## 2017-05-01 DIAGNOSIS — Z9641 Presence of insulin pump (external) (internal): Secondary | ICD-10-CM

## 2017-05-01 DIAGNOSIS — I10 Essential (primary) hypertension: Secondary | ICD-10-CM

## 2017-05-01 DIAGNOSIS — I998 Other disorder of circulatory system: Secondary | ICD-10-CM

## 2017-05-01 DIAGNOSIS — D6481 Anemia due to antineoplastic chemotherapy: Secondary | ICD-10-CM

## 2017-05-01 DIAGNOSIS — C800 Disseminated malignant neoplasm, unspecified: Principal | ICD-10-CM

## 2017-05-01 DIAGNOSIS — Z794 Long term (current) use of insulin: Secondary | ICD-10-CM

## 2017-05-01 DIAGNOSIS — D61818 Other pancytopenia: Secondary | ICD-10-CM

## 2017-05-01 DIAGNOSIS — K449 Diaphragmatic hernia without obstruction or gangrene: Secondary | ICD-10-CM

## 2017-05-01 DIAGNOSIS — N1330 Unspecified hydronephrosis: Secondary | ICD-10-CM

## 2017-05-01 DIAGNOSIS — Z7984 Long term (current) use of oral hypoglycemic drugs: Secondary | ICD-10-CM

## 2017-05-01 DIAGNOSIS — C561 Malignant neoplasm of right ovary: Secondary | ICD-10-CM

## 2017-05-01 DIAGNOSIS — M069 Rheumatoid arthritis, unspecified: Secondary | ICD-10-CM

## 2017-05-01 DIAGNOSIS — C786 Secondary malignant neoplasm of retroperitoneum and peritoneum: Secondary | ICD-10-CM

## 2017-05-01 DIAGNOSIS — G629 Polyneuropathy, unspecified: Secondary | ICD-10-CM

## 2017-05-01 DIAGNOSIS — K219 Gastro-esophageal reflux disease without esophagitis: Secondary | ICD-10-CM

## 2017-05-01 DIAGNOSIS — Z7189 Other specified counseling: Secondary | ICD-10-CM

## 2017-05-01 DIAGNOSIS — E876 Hypokalemia: Secondary | ICD-10-CM

## 2017-05-01 DIAGNOSIS — Z87442 Personal history of urinary calculi: Secondary | ICD-10-CM

## 2017-05-01 LAB — CBC WITH DIFFERENTIAL/PLATELET
Basophils Absolute: 0.1 10*3/uL (ref 0–0.1)
Basophils Relative: 2 %
Eosinophils Absolute: 0 10*3/uL (ref 0–0.7)
Eosinophils Relative: 0 %
HCT: 26.5 % — ABNORMAL LOW (ref 35.0–47.0)
Hemoglobin: 8.7 g/dL — ABNORMAL LOW (ref 12.0–16.0)
Lymphocytes Relative: 16 %
Lymphs Abs: 0.7 10*3/uL — ABNORMAL LOW (ref 1.0–3.6)
MCH: 29 pg (ref 26.0–34.0)
MCHC: 32.8 g/dL (ref 32.0–36.0)
MCV: 88.4 fL (ref 80.0–100.0)
Monocytes Absolute: 0.6 10*3/uL (ref 0.2–0.9)
Monocytes Relative: 13 %
Neutro Abs: 3.2 10*3/uL (ref 1.4–6.5)
Neutrophils Relative %: 69 %
Platelets: 293 10*3/uL (ref 150–440)
RBC: 2.99 MIL/uL — ABNORMAL LOW (ref 3.80–5.20)
RDW: 20.8 % — ABNORMAL HIGH (ref 11.5–14.5)
WBC: 4.6 10*3/uL (ref 3.6–11.0)

## 2017-05-01 LAB — COMPREHENSIVE METABOLIC PANEL
ALT: 9 U/L — ABNORMAL LOW (ref 14–54)
AST: 13 U/L — ABNORMAL LOW (ref 15–41)
Albumin: 3.2 g/dL — ABNORMAL LOW (ref 3.5–5.0)
Alkaline Phosphatase: 78 U/L (ref 38–126)
Anion gap: 7 (ref 5–15)
BUN: 21 mg/dL — ABNORMAL HIGH (ref 6–20)
CO2: 28 mmol/L (ref 22–32)
Calcium: 9.1 mg/dL (ref 8.9–10.3)
Chloride: 100 mmol/L — ABNORMAL LOW (ref 101–111)
Creatinine, Ser: 0.75 mg/dL (ref 0.44–1.00)
GFR calc Af Amer: >60 mL/min (ref >60)
GFR calc non Af Amer: >60 mL/min (ref >60)
Glucose, Bld: 224 mg/dL — ABNORMAL HIGH (ref 65–99)
Potassium: 4.1 mmol/L (ref 3.5–5.1)
Sodium: 135 mmol/L (ref 135–145)
Total Bilirubin: 0.6 mg/dL (ref 0.3–1.2)
Total Protein: 6.8 g/dL (ref 6.5–8.1)

## 2017-05-01 LAB — MAGNESIUM: Magnesium: 1.3 mg/dL — ABNORMAL LOW (ref 1.7–2.4)

## 2017-05-01 MED ORDER — SODIUM CHLORIDE 0.9 % IV SOLN
6.0000 g | Freq: Once | INTRAVENOUS | Status: AC
  Administered 2017-05-01: 6 g via INTRAVENOUS
  Filled 2017-05-01: qty 10

## 2017-05-01 MED ORDER — SODIUM CHLORIDE 0.9 % IV SOLN
Freq: Once | INTRAVENOUS | Status: AC
Start: 2017-05-01 — End: 2017-05-01
  Administered 2017-05-01: 10:00:00 via INTRAVENOUS
  Filled 2017-05-01: qty 1000

## 2017-05-01 MED ORDER — ONDANSETRON HCL 4 MG PO TABS
8.0000 mg | ORAL_TABLET | Freq: Once | ORAL | Status: AC
  Administered 2017-05-01: 8 mg via ORAL
  Filled 2017-05-01: qty 2

## 2017-05-01 MED ORDER — HEPARIN SOD (PORK) LOCK FLUSH 100 UNIT/ML IV SOLN
500.0000 [IU] | Freq: Once | INTRAVENOUS | Status: AC
  Administered 2017-05-01: 500 [IU] via INTRAVENOUS
  Filled 2017-05-01: qty 5

## 2017-05-01 MED ORDER — EMPTY CONTAINERS FLEXIBLE MISC
80.0000 mg/m2 | Freq: Once | Status: AC
  Administered 2017-05-01: 125 mg via INTRAVENOUS
  Filled 2017-05-01: qty 25

## 2017-05-01 MED ORDER — SODIUM CHLORIDE 0.9% FLUSH
10.0000 mL | INTRAVENOUS | Status: DC | PRN
  Filled 2017-05-01: qty 10

## 2017-05-01 MED ORDER — HEPARIN SOD (PORK) LOCK FLUSH 100 UNIT/ML IV SOLN
500.0000 [IU] | Freq: Once | INTRAVENOUS | Status: DC | PRN
  Filled 2017-05-01: qty 5

## 2017-05-01 MED ORDER — SODIUM CHLORIDE 0.9% FLUSH
10.0000 mL | INTRAVENOUS | Status: DC | PRN
Start: 2017-05-01 — End: 2017-05-01
  Administered 2017-05-01: 10 mL via INTRAVENOUS
  Filled 2017-05-01: qty 10

## 2017-05-02 ENCOUNTER — Encounter: Payer: Self-pay | Admitting: Hematology and Oncology

## 2017-05-03 ENCOUNTER — Inpatient Hospital Stay: Payer: Medicare Other | Attending: Hematology and Oncology

## 2017-05-03 DIAGNOSIS — M069 Rheumatoid arthritis, unspecified: Secondary | ICD-10-CM | POA: Insufficient documentation

## 2017-05-03 DIAGNOSIS — R948 Abnormal results of function studies of other organs and systems: Secondary | ICD-10-CM | POA: Diagnosis not present

## 2017-05-03 DIAGNOSIS — Z7689 Persons encountering health services in other specified circumstances: Secondary | ICD-10-CM | POA: Diagnosis not present

## 2017-05-03 DIAGNOSIS — Z801 Family history of malignant neoplasm of trachea, bronchus and lung: Secondary | ICD-10-CM | POA: Insufficient documentation

## 2017-05-03 DIAGNOSIS — D61818 Other pancytopenia: Secondary | ICD-10-CM | POA: Diagnosis not present

## 2017-05-03 DIAGNOSIS — C561 Malignant neoplasm of right ovary: Secondary | ICD-10-CM | POA: Diagnosis present

## 2017-05-03 DIAGNOSIS — C786 Secondary malignant neoplasm of retroperitoneum and peritoneum: Secondary | ICD-10-CM | POA: Diagnosis not present

## 2017-05-03 DIAGNOSIS — Z79899 Other long term (current) drug therapy: Secondary | ICD-10-CM | POA: Insufficient documentation

## 2017-05-03 DIAGNOSIS — J90 Pleural effusion, not elsewhere classified: Secondary | ICD-10-CM | POA: Insufficient documentation

## 2017-05-03 DIAGNOSIS — K219 Gastro-esophageal reflux disease without esophagitis: Secondary | ICD-10-CM | POA: Diagnosis not present

## 2017-05-03 DIAGNOSIS — R0602 Shortness of breath: Secondary | ICD-10-CM | POA: Diagnosis not present

## 2017-05-03 DIAGNOSIS — Z87442 Personal history of urinary calculi: Secondary | ICD-10-CM | POA: Insufficient documentation

## 2017-05-03 DIAGNOSIS — Z7952 Long term (current) use of systemic steroids: Secondary | ICD-10-CM | POA: Insufficient documentation

## 2017-05-03 DIAGNOSIS — G629 Polyneuropathy, unspecified: Secondary | ICD-10-CM | POA: Diagnosis not present

## 2017-05-03 DIAGNOSIS — Z5111 Encounter for antineoplastic chemotherapy: Secondary | ICD-10-CM | POA: Insufficient documentation

## 2017-05-03 DIAGNOSIS — Z90722 Acquired absence of ovaries, bilateral: Secondary | ICD-10-CM | POA: Insufficient documentation

## 2017-05-03 DIAGNOSIS — D72819 Decreased white blood cell count, unspecified: Secondary | ICD-10-CM | POA: Insufficient documentation

## 2017-05-03 DIAGNOSIS — I2510 Atherosclerotic heart disease of native coronary artery without angina pectoris: Secondary | ICD-10-CM | POA: Insufficient documentation

## 2017-05-03 DIAGNOSIS — Z90710 Acquired absence of both cervix and uterus: Secondary | ICD-10-CM | POA: Insufficient documentation

## 2017-05-03 DIAGNOSIS — E876 Hypokalemia: Secondary | ICD-10-CM | POA: Diagnosis not present

## 2017-05-03 DIAGNOSIS — K449 Diaphragmatic hernia without obstruction or gangrene: Secondary | ICD-10-CM | POA: Diagnosis not present

## 2017-05-03 DIAGNOSIS — K222 Esophageal obstruction: Secondary | ICD-10-CM | POA: Diagnosis not present

## 2017-05-03 DIAGNOSIS — R634 Abnormal weight loss: Secondary | ICD-10-CM | POA: Insufficient documentation

## 2017-05-03 DIAGNOSIS — E109 Type 1 diabetes mellitus without complications: Secondary | ICD-10-CM | POA: Insufficient documentation

## 2017-05-03 DIAGNOSIS — Z87891 Personal history of nicotine dependence: Secondary | ICD-10-CM | POA: Insufficient documentation

## 2017-05-03 DIAGNOSIS — E785 Hyperlipidemia, unspecified: Secondary | ICD-10-CM | POA: Insufficient documentation

## 2017-05-03 DIAGNOSIS — Z794 Long term (current) use of insulin: Secondary | ICD-10-CM | POA: Insufficient documentation

## 2017-05-03 DIAGNOSIS — C7919 Secondary malignant neoplasm of other urinary organs: Secondary | ICD-10-CM | POA: Diagnosis not present

## 2017-05-03 DIAGNOSIS — Z8701 Personal history of pneumonia (recurrent): Secondary | ICD-10-CM | POA: Insufficient documentation

## 2017-05-03 DIAGNOSIS — I998 Other disorder of circulatory system: Secondary | ICD-10-CM | POA: Insufficient documentation

## 2017-05-03 DIAGNOSIS — I252 Old myocardial infarction: Secondary | ICD-10-CM | POA: Diagnosis not present

## 2017-05-03 DIAGNOSIS — D709 Neutropenia, unspecified: Secondary | ICD-10-CM | POA: Insufficient documentation

## 2017-05-03 DIAGNOSIS — D631 Anemia in chronic kidney disease: Secondary | ICD-10-CM | POA: Insufficient documentation

## 2017-05-03 DIAGNOSIS — I110 Hypertensive heart disease with heart failure: Secondary | ICD-10-CM | POA: Insufficient documentation

## 2017-05-03 DIAGNOSIS — I509 Heart failure, unspecified: Secondary | ICD-10-CM | POA: Diagnosis not present

## 2017-05-03 MED ORDER — TBO-FILGRASTIM 300 MCG/0.5ML ~~LOC~~ SOSY
300.0000 ug | PREFILLED_SYRINGE | Freq: Once | SUBCUTANEOUS | Status: AC
  Administered 2017-05-03: 300 ug via SUBCUTANEOUS

## 2017-05-04 ENCOUNTER — Inpatient Hospital Stay: Payer: Medicare Other

## 2017-05-04 ENCOUNTER — Other Ambulatory Visit: Payer: Self-pay | Admitting: Hematology and Oncology

## 2017-05-04 DIAGNOSIS — C561 Malignant neoplasm of right ovary: Secondary | ICD-10-CM | POA: Diagnosis not present

## 2017-05-04 DIAGNOSIS — C800 Disseminated malignant neoplasm, unspecified: Principal | ICD-10-CM

## 2017-05-04 LAB — MAGNESIUM: Magnesium: 1.5 mg/dL — ABNORMAL LOW (ref 1.7–2.4)

## 2017-05-04 MED ORDER — TBO-FILGRASTIM 300 MCG/0.5ML ~~LOC~~ SOSY
300.0000 ug | PREFILLED_SYRINGE | Freq: Once | SUBCUTANEOUS | Status: AC
  Administered 2017-05-04: 300 ug via SUBCUTANEOUS
  Filled 2017-05-04: qty 0.5

## 2017-05-04 MED ORDER — SODIUM CHLORIDE 0.9 % IV SOLN
6.0000 g | Freq: Once | INTRAVENOUS | Status: DC
  Administered 2017-05-04: 6 g via INTRAVENOUS
  Filled 2017-05-04: qty 12

## 2017-05-05 ENCOUNTER — Inpatient Hospital Stay: Payer: Medicare Other

## 2017-05-05 ENCOUNTER — Ambulatory Visit: Payer: Medicare Other

## 2017-05-05 ENCOUNTER — Other Ambulatory Visit: Payer: Self-pay | Admitting: Hematology and Oncology

## 2017-05-05 VITALS — BP 130/68 | HR 80 | Temp 98.0°F | Resp 20

## 2017-05-05 DIAGNOSIS — C801 Malignant (primary) neoplasm, unspecified: Secondary | ICD-10-CM

## 2017-05-05 DIAGNOSIS — C561 Malignant neoplasm of right ovary: Secondary | ICD-10-CM | POA: Diagnosis not present

## 2017-05-05 MED ORDER — TBO-FILGRASTIM 300 MCG/0.5ML ~~LOC~~ SOSY
300.0000 ug | PREFILLED_SYRINGE | Freq: Once | SUBCUTANEOUS | Status: AC
  Administered 2017-05-05: 300 ug via SUBCUTANEOUS

## 2017-05-05 MED ORDER — HEPARIN SOD (PORK) LOCK FLUSH 100 UNIT/ML IV SOLN
500.0000 [IU] | Freq: Once | INTRAVENOUS | Status: AC
  Administered 2017-05-05: 500 [IU] via INTRAVENOUS

## 2017-05-05 MED ORDER — SODIUM CHLORIDE 0.9% FLUSH
10.0000 mL | INTRAVENOUS | Status: DC | PRN
  Administered 2017-05-05: 10 mL via INTRAVENOUS
  Filled 2017-05-05: qty 10

## 2017-05-08 ENCOUNTER — Other Ambulatory Visit: Payer: Self-pay | Admitting: Hematology and Oncology

## 2017-05-08 ENCOUNTER — Inpatient Hospital Stay (HOSPITAL_BASED_OUTPATIENT_CLINIC_OR_DEPARTMENT_OTHER): Payer: Medicare Other | Admitting: Urgent Care

## 2017-05-08 ENCOUNTER — Inpatient Hospital Stay: Payer: Medicare Other

## 2017-05-08 VITALS — BP 124/64 | HR 80 | Temp 97.5°F | Resp 18 | Wt 131.1 lb

## 2017-05-08 VITALS — BP 119/68 | HR 69 | Resp 20

## 2017-05-08 DIAGNOSIS — D72819 Decreased white blood cell count, unspecified: Secondary | ICD-10-CM

## 2017-05-08 DIAGNOSIS — I10 Essential (primary) hypertension: Secondary | ICD-10-CM

## 2017-05-08 DIAGNOSIS — E785 Hyperlipidemia, unspecified: Secondary | ICD-10-CM

## 2017-05-08 DIAGNOSIS — D709 Neutropenia, unspecified: Secondary | ICD-10-CM | POA: Diagnosis not present

## 2017-05-08 DIAGNOSIS — C569 Malignant neoplasm of unspecified ovary: Secondary | ICD-10-CM

## 2017-05-08 DIAGNOSIS — C800 Disseminated malignant neoplasm, unspecified: Principal | ICD-10-CM

## 2017-05-08 DIAGNOSIS — C786 Secondary malignant neoplasm of retroperitoneum and peritoneum: Secondary | ICD-10-CM | POA: Diagnosis not present

## 2017-05-08 DIAGNOSIS — R0602 Shortness of breath: Secondary | ICD-10-CM

## 2017-05-08 DIAGNOSIS — C801 Malignant (primary) neoplasm, unspecified: Secondary | ICD-10-CM

## 2017-05-08 DIAGNOSIS — I509 Heart failure, unspecified: Secondary | ICD-10-CM

## 2017-05-08 DIAGNOSIS — D561 Beta thalassemia: Secondary | ICD-10-CM | POA: Diagnosis not present

## 2017-05-08 DIAGNOSIS — Z79899 Other long term (current) drug therapy: Secondary | ICD-10-CM

## 2017-05-08 DIAGNOSIS — M069 Rheumatoid arthritis, unspecified: Secondary | ICD-10-CM

## 2017-05-08 DIAGNOSIS — C561 Malignant neoplasm of right ovary: Secondary | ICD-10-CM | POA: Diagnosis not present

## 2017-05-08 DIAGNOSIS — D631 Anemia in chronic kidney disease: Secondary | ICD-10-CM

## 2017-05-08 DIAGNOSIS — T451X5A Adverse effect of antineoplastic and immunosuppressive drugs, initial encounter: Secondary | ICD-10-CM

## 2017-05-08 DIAGNOSIS — K219 Gastro-esophageal reflux disease without esophagitis: Secondary | ICD-10-CM

## 2017-05-08 DIAGNOSIS — G629 Polyneuropathy, unspecified: Secondary | ICD-10-CM

## 2017-05-08 DIAGNOSIS — E786 Lipoprotein deficiency: Secondary | ICD-10-CM

## 2017-05-08 DIAGNOSIS — K222 Esophageal obstruction: Secondary | ICD-10-CM

## 2017-05-08 DIAGNOSIS — J90 Pleural effusion, not elsewhere classified: Secondary | ICD-10-CM

## 2017-05-08 DIAGNOSIS — D61818 Other pancytopenia: Secondary | ICD-10-CM

## 2017-05-08 DIAGNOSIS — Z7689 Persons encountering health services in other specified circumstances: Secondary | ICD-10-CM

## 2017-05-08 DIAGNOSIS — I998 Other disorder of circulatory system: Secondary | ICD-10-CM

## 2017-05-08 DIAGNOSIS — Z90710 Acquired absence of both cervix and uterus: Secondary | ICD-10-CM

## 2017-05-08 DIAGNOSIS — E109 Type 1 diabetes mellitus without complications: Secondary | ICD-10-CM

## 2017-05-08 DIAGNOSIS — I252 Old myocardial infarction: Secondary | ICD-10-CM

## 2017-05-08 DIAGNOSIS — C7919 Secondary malignant neoplasm of other urinary organs: Secondary | ICD-10-CM

## 2017-05-08 DIAGNOSIS — Z87891 Personal history of nicotine dependence: Secondary | ICD-10-CM

## 2017-05-08 DIAGNOSIS — R634 Abnormal weight loss: Secondary | ICD-10-CM

## 2017-05-08 DIAGNOSIS — Z8701 Personal history of pneumonia (recurrent): Secondary | ICD-10-CM

## 2017-05-08 DIAGNOSIS — I2510 Atherosclerotic heart disease of native coronary artery without angina pectoris: Secondary | ICD-10-CM

## 2017-05-08 DIAGNOSIS — G620 Drug-induced polyneuropathy: Secondary | ICD-10-CM

## 2017-05-08 DIAGNOSIS — J449 Chronic obstructive pulmonary disease, unspecified: Secondary | ICD-10-CM

## 2017-05-08 DIAGNOSIS — R948 Abnormal results of function studies of other organs and systems: Secondary | ICD-10-CM | POA: Diagnosis not present

## 2017-05-08 DIAGNOSIS — Z90722 Acquired absence of ovaries, bilateral: Secondary | ICD-10-CM

## 2017-05-08 DIAGNOSIS — Z794 Long term (current) use of insulin: Secondary | ICD-10-CM

## 2017-05-08 DIAGNOSIS — Z5112 Encounter for antineoplastic immunotherapy: Secondary | ICD-10-CM

## 2017-05-08 DIAGNOSIS — Z8742 Personal history of other diseases of the female genital tract: Secondary | ICD-10-CM

## 2017-05-08 DIAGNOSIS — Z801 Family history of malignant neoplasm of trachea, bronchus and lung: Secondary | ICD-10-CM

## 2017-05-08 DIAGNOSIS — Z7952 Long term (current) use of systemic steroids: Secondary | ICD-10-CM

## 2017-05-08 LAB — CBC WITH DIFFERENTIAL/PLATELET
Basophils Absolute: 0 10*3/uL (ref 0–0.1)
Basophils Relative: 1 %
Eosinophils Absolute: 0 10*3/uL (ref 0–0.7)
Eosinophils Relative: 1 %
HCT: 27.2 % — ABNORMAL LOW (ref 35.0–47.0)
Hemoglobin: 9.1 g/dL — ABNORMAL LOW (ref 12.0–16.0)
Lymphocytes Relative: 18 %
Lymphs Abs: 0.7 10*3/uL — ABNORMAL LOW (ref 1.0–3.6)
MCH: 29.8 pg (ref 26.0–34.0)
MCHC: 33.5 g/dL (ref 32.0–36.0)
MCV: 88.9 fL (ref 80.0–100.0)
Monocytes Absolute: 0.6 10*3/uL (ref 0.2–0.9)
Monocytes Relative: 16 %
Neutro Abs: 2.6 10*3/uL (ref 1.4–6.5)
Neutrophils Relative %: 64 %
Platelets: 271 10*3/uL (ref 150–440)
RBC: 3.06 MIL/uL — ABNORMAL LOW (ref 3.80–5.20)
RDW: 20.3 % — ABNORMAL HIGH (ref 11.5–14.5)
WBC: 4.1 10*3/uL (ref 3.6–11.0)

## 2017-05-08 LAB — COMPREHENSIVE METABOLIC PANEL
ALT: 10 U/L — ABNORMAL LOW (ref 14–54)
AST: 16 U/L (ref 15–41)
Albumin: 3.2 g/dL — ABNORMAL LOW (ref 3.5–5.0)
Alkaline Phosphatase: 92 U/L (ref 38–126)
Anion gap: 8 (ref 5–15)
BUN: 18 mg/dL (ref 6–20)
CO2: 27 mmol/L (ref 22–32)
Calcium: 9 mg/dL (ref 8.9–10.3)
Chloride: 98 mmol/L — ABNORMAL LOW (ref 101–111)
Creatinine, Ser: 0.74 mg/dL (ref 0.44–1.00)
GFR calc Af Amer: >60 mL/min (ref >60)
GFR calc non Af Amer: >60 mL/min (ref >60)
Glucose, Bld: 267 mg/dL — ABNORMAL HIGH (ref 65–99)
Potassium: 3.8 mmol/L (ref 3.5–5.1)
Sodium: 133 mmol/L — ABNORMAL LOW (ref 135–145)
Total Bilirubin: 0.5 mg/dL (ref 0.3–1.2)
Total Protein: 6.7 g/dL (ref 6.5–8.1)

## 2017-05-08 LAB — MAGNESIUM: Magnesium: 1.2 mg/dL — ABNORMAL LOW (ref 1.7–2.4)

## 2017-05-08 MED ORDER — SODIUM CHLORIDE 0.9 % IV SOLN
6.0000 g | Freq: Once | INTRAVENOUS | Status: DC
  Administered 2017-05-08: 6 g via INTRAVENOUS
  Filled 2017-05-08: qty 10

## 2017-05-08 MED ORDER — SODIUM CHLORIDE 0.9 % IV SOLN
Freq: Once | INTRAVENOUS | Status: AC
  Administered 2017-05-08: 10:00:00 via INTRAVENOUS
  Filled 2017-05-08: qty 1000

## 2017-05-08 MED ORDER — ONDANSETRON HCL 4 MG PO TABS
8.0000 mg | ORAL_TABLET | Freq: Once | ORAL | Status: AC
  Administered 2017-05-08: 8 mg via ORAL
  Filled 2017-05-08: qty 2

## 2017-05-08 MED ORDER — PACLITAXEL PROTEIN-BOUND CHEMO INJECTION 100 MG
80.0000 mg/m2 | Freq: Once | Status: AC
  Administered 2017-05-08: 125 mg via INTRAVENOUS
  Filled 2017-05-08: qty 25

## 2017-05-08 MED FILL — Paclitaxel Protein-Bound Particles For IV Susp 100 MG: INTRAVENOUS | Qty: 15 | Status: AC

## 2017-05-08 MED FILL — Paclitaxel Protein-Bound Particles For IV Susp 100 MG: INTRAVENOUS | Qty: 125 | Status: AC

## 2017-05-08 NOTE — Progress Notes (Signed)
Patient offers no complaints today.  States she usually had a little nausea right after chemo.

## 2017-05-08 NOTE — Progress Notes (Signed)
[facility] day:  05/08/2017   Chief Complaint: [name] is a 72 y.o. female with stage IV ovarian cancer who is seen for assessment prior to day 8 of cycle #3 Abraxane.  HPI:  The patient was last seen in the medical oncology clinic on 05/01/2017.  At that time, patient had been doing well. She described a good holiday. Patient complained of palmar-plantar neuropathy that had increased slightly. She noted that she dropped things "once in awhile". Patient had lost 3 pounds. Exam was stable. WBC 4.6 with an [place] of 3200. Hemoglobin 8.7, hematocrit 26.5, and platelets 293,000. Magnesium continue to be low 1.3. She received day 1 of cycle #3 Abraxane, in addition to 6 grams of intravenous magnesium.  Patient return to the clinic on 05/03/2017 - 05/05/2017 for GCSF injections. Magnesium was checked again on 05/04/2017 and found to be low at 1.5. Patient requesting to reduce the amount of time that she has to spend in the clinic receiving IV magnesium replacement. Patient was sent home with a 6 gram magnesium infusion pump. She returned to the clinic on 05/05/2017 for pump disconnect.  In the interim, patient has been doing well. Her last chemotherapy treatment was tolerated well by the patient. She denies nausea, vomiting, and diarrhea. Patient notes that her appetite has remained "good". She has gained 3 pounds since her last visit to the clinic.   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 This symptom is stable and is not reported to be causing issues with patient's day to day functioning. Patient denies any B symptoms or interval infections.   Patient denies pain in the clinic today.   Past Medical History:  Diagnosis Date  . CAD (coronary artery disease)    a. 09/2012 Cath: LM nl, LAD 95p, 46m LCX 936mOM2 50, RCA 100.  . Marland KitchenHF (congestive heart failure) ([facility]  . Cholelithiasis   . Collagen vascular  disease (HCC)    Rheumatoid Arthritis.  . Esophageal stricture   . Exertional shortness of breath   . GERD (gastroesophageal reflux disease)   . H/O hiatal hernia   . Herniated disc   . History of pancytopenia   . Hyperlipidemia   . Hypertension   . Hypokalemia   . Leukopenia 2012   s/p bone marrow biopsy, Dr. PaMa Hillock. NSTEMI (non-ST elevated myocardial infarction) ([facility]5/2014   "mild" (10/18/2012)  . Ovarian cancer ([facility]2016   chemo and hysterectomy  . Pneumonia 2013; 08/2012   "one lung; double" (10/18/2012)  . Rheumatoid arthritis(714.0)   . Type I diabetes mellitus ([facility]   "dx'd in 1957" (10/18/2012)    Past Surgical History:  Procedure Laterality Date  . ABDOMINAL HYSTERECTOMY  06/15/2015   Dx L/S, EXLAP TAH BSO omentectomy RSRx colostomy diaphragm resection stripping  . CARDIAC CATHETERIZATION  10/18/2012   "first one was today" (10/18/2012)  . CATARACT EXTRACTION W/ INTRAOCULAR LENS IMPLANT Right 2010  . CHOLECYSTECTOMY  06/15/2015   combined case with ovarian cancer debulking  . COLON SURGERY    . CORONARY ARTERY BYPASS GRAFT N/A 10/19/2012   Procedure: CORONARY ARTERY BYPASS GRAFTING (CABG);  Surgeon: StMelrose NakayamaMD;  [place] [facility] Service: Open Heart Surgery;  Laterality: N/A;  . ESOPHAGEAL DILATION     "3 or 4 times" (10/19/2011)  . ESOPHAGOGASTRODUODENOSCOPY  2012   Dr. IfMinna Merritts. OSTOMY    . OVARY SURGERY  removal  . PERIPHERAL VASCULAR CATHETERIZATION N/A 03/02/2015   Procedure: Glori Luis Cath Insertion;  Surgeon: Algernon Huxley, MD;  Location: [facility];  Service: Cardiovascular;  Laterality: N/A;  . TUBAL LIGATION  1970    Family History  Problem Relation Age of Onset  . Diabetes Mother   . Arthritis Mother   . Diabetes Father   . Arthritis Father   . Aneurysm Sister        neck  . Cancer Sister        lung    Social History:  reports that she quit smoking about 22 years ago. Her smoking use included cigarettes. She has a  30.00 pack-year smoking history. she has never used smokeless tobacco. She reports that she does not drink alcohol or use drugs.  Patient is a retired Art therapist. Patient denies exposure to radiation and toxins. She is accompanied by her husband today.  Allergies:  Allergies  Allergen Reactions  . Codeine Nausea And Vomiting  . Latex Rash    Current Medications: Current Outpatient Medications  Medication Sig Dispense Refill  . Calcium-Vitamin D 600-200 MG-UNIT tablet Take 1 tablet by mouth 2 (two) times daily.     . cholecalciferol (VITAMIN D) 1000 units tablet Take 1,000 Units by mouth daily.    . DULoxetine (CYMBALTA) 20 MG capsule Take by mouth.    . enoxaparin (LOVENOX) 60 MG/0.6ML injection INJECT 0.6 ML (60 MG TOTAL) INTO THE SKIN EVERY 12 HOURS 60 mL 3  . folic acid (FOLVITE) 1 MG tablet Take 1 tablet (1 mg total) by mouth daily. 90 tablet 3  . furosemide (LASIX) 40 MG tablet Take 1 tablet (40 mg total) by mouth daily as needed for edema. 30 tablet 1  . insulin lispro (HUMALOG) 100 UNIT/ML injection Inject 0.06 mLs (6 Units total) into the skin daily. (Patient taking differently: Inject 0-85 Units into the skin daily. Via insulin pump) 30 mL 3  . lidocaine-prilocaine (EMLA) cream APPLY 1 APPLICATION TOPICALLY AS NEEDED 1 HOUR PRIOR TO TREATMENT. COVER WITH PRESS N SEAL UNTIL TREATMENT TIME AS DIRECTED 30 g 1  . loratadine (CLARITIN) 10 MG tablet Take 1 tablet by mouth daily as needed.    . magnesium oxide (MAG-OX) 400 (241.3 Mg) MG tablet TAKE 1 TABLET BY MOUTH ONCE DAILY 30 tablet 1  . metoprolol tartrate (LOPRESSOR) 25 MG tablet Take 1 tablet (25 mg total) by mouth 2 (two) times daily. 180 tablet 3  . ondansetron (ZOFRAN) 8 MG tablet TAKE 1 TABLET BY MOUTH EVERY 8 HOURS AS NEEDED FOR NAUSEA OR VOMITING 30 tablet 1  . pantoprazole (PROTONIX) 40 MG tablet TAKE 1 TABLET BY MOUTH TWICE (2) DAILY 180 tablet 1  . potassium chloride SA (K-DUR,KLOR-CON) 20 MEQ tablet TAKE 1 TABLET BY  MOUTH ONCE A DAY 90 tablet 1  . predniSONE (DELTASONE) 5 MG tablet Take 5 mg by mouth daily with breakfast.    . vitamin C (ASCORBIC ACID) 500 MG tablet Take 500 mg by mouth daily.    Marland Kitchen zinc gluconate 50 MG tablet Take 50 mg daily by mouth.     Current Facility-Administered Medications  Medication Dose Route Frequency Provider Last Rate Last Dose  . Tbo-Filgrastim (GRANIX) injection 300 mcg  300 mcg Subcutaneous Once Lequita Asal, MD       Facility-Administered Medications Ordered in Other Visits  Medication Dose Route Frequency Provider Last Rate Last Dose  . 0.9 %  sodium chloride infusion   Intravenous  Continuous Lequita Asal, MD   Stopped at 04/24/17 1140  . magnesium sulfate 6 g in sodium chloride 0.9 % 138 mL OP infusion via pump  6 g Intravenous Once Lequita Asal, MD   6 g at 05/08/17 1152  . magnesium sulfate IVPB 4 g 100 mL  4 g Intravenous Once Faythe Casa E, NP      . sodium chloride 0.9 % injection 10 mL  10 mL Intracatheter PRN Corcoran, Melissa C, MD      . sodium chloride 0.9 % injection 10 mL  10 mL Intravenous PRN Lequita Asal, MD   10 mL at 04/13/15 9741    Review of Systems:  GENERAL:  Feels "well".  No fever, chills or sweats.  Weight up 3 pounds. PERFORMANCE STATUS (ECOG):  1 HEENT:  No visual changes, sore throat, mouth sores or tenderness. Lungs:  No shortness of breath.  No cough.  No hemoptysis. Cardiac:  No chest pain, palpitations, orthopnea, or PND. GI:  No nausea, vomiting, diarrhea, melena or hematochezia. GU:  No urgency, frequency, dysuria, or hematuria. Musculoskeletal:  Back pain.  Severe rheumatoid arthritis (on prednisone).  Osteoporosis.  No back pain. No muscle tenderness. Extremities:  No pain or swelling. Skin:  No increased bruising or bleeding.  No rashes or skin changes. Neuro:  Neuropathy in hands and feet (increased- see HPI).  No headache, numbness or weakness, balance or coordination issues. Endocrine:   Diabetes on an insulin pump.  Blood sugar elevated on steroids.  Occasional hot flashes.  Psych:  No mood changes, depression or anxiety. Pain:  No focal pain. Review of systems:  All other systems reviewed and found to be negative.  Physical Exam:  Blood pressure 124/64, pulse 80, temperature (!) 97.5 F (36.4 C), temperature source Tympanic, resp. rate 18, weight 131 lb 2 oz (59.5 kg). GENERAL:  Well developed, well nourished woman sitting comfortably in the exam room in no acute distress.  She has a cane at her side. MENTAL STATUS:  Alert and oriented to person, place and time. HEAD:  Wearing a light blue cap.  Alopecia.  Normocephalic, atraumatic, face symmetric, no Cushingoid features. EYES:  Gold rimmed glasses.  Blue eyes.  No conjunctivitis or scleral icterus.  ENT: Oropharynx clear without lesion. Tongue normal. Mucous membranes moist.  RESPIRATORY: Decreased breath sounds right base.  No wheezes or rhonchi. CARDIOVASCULAR: Regular rate and rhythm without murmur, rub or gallop. ABDOMEN: Soft, , non-tender with active bowel sounds and no appreciable hepatosplenomegaly. No palpable nodularity or masses.  Insulin pump.  Ostomy bag. SKIN: No rashes or ulcers. EXTREMITIES:  No edema, skin discoloration or tenderness. No palpable cords. LYMPH NODES: No palpable cervical, supraclavicular, axillary or inguinal adenopathy  NEUROLOGICAL: Appropriate. PSYCH: Appropriate.   Radiology studies: 02/13/2015:  Abdomen and pelvic CT revealed bilateral mass-like adnexal regions (right adnexal mass 5.6 x 5.0 cm and the left adnexa mass 10.3 x 6.0 x 9.9 cm).  There was a large amount of soft tissue throughout the peritoneal cavity involving the omentum and other peritoneal surfaces.  There was a small volume ascites. There was a peripheral 3.3 x 1.9 cm low-attenuation lesion overlying the right lobe of the liver, likely representing a serosal implant. There was 1.4 x 2.2 cm ill-defined  peripheral lesion within the inferior aspect of segment 6 adjacent to the ampulla in the duodenum.  04/28/2015:  Abdominal and pelvic CT revealed decreasing bilateral ovarian masses.   The left adnexal mass measured 4.0  x 6.1 cm (previously 5.0 x 8.5 cm). The right ovary measured 3.8 x 4.9 cm (previously 4.7 x 5.6 cm).  There was improved peritoneal carcinomatosis.  There was a small amount of ascites.  The hepatic dome lesion was stable. The previously seen right hepatic lobe lesion was not well-visualized.  There was a small left pleural effusion and trace right pleural effusion.  The nodular lesion at the ampulla of Vater, extending into the duodenum was stable.  There was no biliary ductal dilatation. 08/01/2015:  Rght upper extremity ultrasound revealed a near occlusive thrombus within the central portion of the right internal jugular vein and central portion of the right subclavian vein. 09/01/2015:  Chest, abdomen, and pelvic CT revealed continued decrease in perihepatic fluid collection contiguous with the right pleural space with percutaneous drain.  09/11/2015:  Chest, abdomen, and pelvic CT revealed a residual versus recurrent fluid collection posterior to the right hepatic lobe (2.6 x 1.1 x 4.0 cm).   10/13/2015:  Chest, abdomen, and pelvic CT revealed resolution of the empyema. 02/15/2016:  Chest, abdomen, and pelvic CT revealed multiple soft tissue nodules throughout the pelvis, small bowel mesenteric and peritoneum and, concerning for peritoneal metastatic disease.  There was soft tissue irregularity within the left upper quadrant.  There was mild right hydronephrosis (etiology unclear).  There was interval resolution of previously described right pleural based fluid and gas collection. There was a pleural based nodule within the right lower hemi-thorax concerning for pleural based metastasis.  There were small bilateral pleural effusions.  There was pulmonary nodularity, predominately within  the left upper lobe (metastatic or infectious/inflammatory etiology).  There was slightly increased mediastinal adenopathy (infectious/inflammatory or metastatic).  There was a low attenuation lesion within the left hepatic lobe (complicated fluid within the fissure or metastatic disease). 03/08/2016:  Abdomen and pelvic CT revealed mild progression of peritoneal disease in the abdomen.  There was interval increase in loculated fluid around the lateral segment left liver, stomach, and spleen.  There was persistent soft tissue lesion at the level of the ampulla with mild intra and extrahepatic biliary duct dilatation.  There was persistent intrahepatic and capsular metastatic disease involving the liver. 05/26/2016:  Head MRI revealed multiple small areas of acute infarct involving the occipital parietal lobe bilaterally and the left lateral cerebellum,  consistent with posterior circulation emboli.  06/10/2016:  Adomen and pelvic CT revealed progression of peritoneal carcinomatosis predominantly in the perihepatic space, bilateral lower quadrants and pelvis.  There was worsening bilateral obstructive uropathy due to malignant involvement of the pelvic ureters.  There was stable small pleural/subpleural nodules at the right lung base.  There was stable small left pleural effusion.  There was decreased small volume perihepatic ascites.  08/09/2016:  Abdomen and pelvic CT revealed interval increase and loculated fluid collections within the peritoneal space consistent with progression of peritoneal ovarian carcinoma metastasis.  There was one large 7 cm collection in the RIGHT lower quadrant which had increased significantly in size and may represent of point of small bowel obstruction.  The proximal stomach and duodenum were decompressed. The mid small bowel was mildly dilated. There was concern for obstruction given the large intraperitoneal fluid collection.  There was increase in subcapsular hepatic metastatic  fluid collections.  There was enlargement of rounded ampullary lesion in the second portion duodenum concerning for metastatic lesion.  There was mild biliary duct dilatation (stable).  There was nodular pleural metastasis in the RIGHT lower lobe pleural space. 11/03/2016:  Abdomen and  pelvic CT revealed multifocal cystic metastases in the abdomen/pelvis, reflecting peritoneal disease.  The dominant lesion in the left anterior abdomen has progressed, while additional lesions were mixed.  Peritoneal disease along the liver and spleen progressed.  Pleural-based metastases along the posterior right hemithorax progressed. There were trace left pleural effusion.  There was a stable 1.7 cm duodenal lesion at the ampulla. 11/29/2016:  Abdomen and pelvic CT revealed mild interval enlargement of pleural metastasis the RIGHT lower lobe.  There was overall mild decrease in cystic peritoneal metastasis within the abdomen pelvis. The solid lesion at the terminal ileum was decreased in size. Subcapsular lesion in the liver was slightly decreased in size.  There was no evidence of disease progression the abdomen pelvis.  There was mild intrahepatic duct dilatation similar prior.  The ampullary lesion was slightly larger.  There was mild hydronephrosis and hydroureter on the LEFT likely related to cystic metastasis at the LEFT vesicoureteral junction 12/09/2016:  MRCP revealed the cystic lesion within the pancreatic head/uncinate process had decreased in size and demonstrated no suspicious characteristics. This can be presumed a pseudocyst or focus of side branch duct ectasia.  There was similar to slight progression of extensive peritoneal metastasis since 11/29/2016. Grossly similar abdominal nodal and right pleural metastasis.  There was chronic mild common duct dilatation with similar soft tissue fullness about the ampulla compared to 11/29/2016.  There was improved to resolved left-sided hydronephrosis. 01/20/2017:   Abdomen and pelvic CT revealed complex mixed interval changes in widespread metastatic disease in the peritoneal cavity, lower chest and thoracic, abdominal and pelvic lymph nodes. Right lower chest pleural metastasis and several of the peritoneal tumor implants had increased.  There was new right inguinal lymphadenopathy.  Some of the peritoneal tumor implants had decreased.  The small dependent left pleural effusion was slightly increased .  The mild left hydroureteronephrosis was improved.  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   The chronic biliary ductal dilatation was stable .  There was no evidence of bowel obstruction.  Admissions: [facility] from 06/15/2015 - 06/22/2015.  She underwent exploratory laparotomy, lysis of adhesions, total abdominal hysterectomy with bilateral salpingo-oophorectomy, infracolic omentectomy, optimal tumor debulking(< 1 cm), recto-sigmoid resection with creation of end colostomy, cholecystectomy, mobilization of splenic flexure and liver with diaphragmatic stripping on 06/15/2015. The right diaphragm was cleared of tumor. During dissection, the diaphragm was entered and closed with sutures.  [place] [facility] from 07/01/2015 - 07/03/2015 with pleural effusion. [facility] from 07/07/2015 - 07/09/2015 with recurrent pleural effusion. [facility] from 07/29/2015 - 08/10/2015 with a right-sided empyema and liver abscess. She underwent CT-guided placement of a liver abscess drain on 07/30/2015.  Liver abscess culture grew out group B strep and Enterobacter which was sensitive to Zosyn. She was transitioned to ertapenem Colbert Ewing) prior to discharge.  She was readmitted to [facility] on 09/17/2015. [facility] from 05/26/2016 - 05/28/2016 with altered mental status and an acute embolic CVA.  Head MRI on 05/26/2016 revealed multiple small areas of acute infarct involving the occipital parietal lobe bilaterally and the left lateral cerebellum,  consistent with posterior circulation emboli.  Work-up included a negative  carotid ultrasound and echocardiogram. [facility] from 08/10/2016 - 08/14/2016 with a small bowel obstruction.  She was managed conservatively. [facility] from 09/27/2016 - 09/28/2016 with symptomatic anemia and diarrhea.   Appointment on 05/08/2017  Component Date Value Ref Range Status  . Magnesium 05/08/2017 1.2* 1.7 - 2.4 mg/dL Final   Performed at [facility], [address], [place], [place] [address]  .  Sodium 05/08/2017 133* 135 - 145 mmol/L Final  . Potassium 05/08/2017 3.8  3.5 - 5.1 mmol/L Final  . Chloride 05/08/2017 98* 101 - 111 mmol/L Final  . CO2 05/08/2017 27  22 - 32 mmol/L Final  . Glucose, Bld 05/08/2017 267* 65 - 99 mg/dL Final  . BUN 05/08/2017 18  6 - 20 mg/dL Final  . Creatinine, Ser 05/08/2017 0.74  0.44 - 1.00 mg/dL Final  . Calcium 05/08/2017 9.0  8.9 - 10.3 mg/dL Final  . Total Protein 05/08/2017 6.7  6.5 - 8.1 g/dL Final  . Albumin 05/08/2017 3.2* 3.5 - 5.0 g/dL Final  . AST 05/08/2017 16  15 - 41 U/L Final  . ALT 05/08/2017 10* 14 - 54 U/L Final  . Alkaline Phosphatase 05/08/2017 92  38 - 126 U/L Final  . Total Bilirubin 05/08/2017 0.5  0.3 - 1.2 mg/dL Final  . GFR calc non Af Amer 05/08/2017 >60  >60 mL/min Final  . GFR calc Af Amer 05/08/2017 >60  >60 mL/min Final   Comment: (NOTE) The eGFR has been calculated using the CKD EPI equation. This calculation has not been validated in all clinical situations. eGFR's persistently <60 mL/min signify possible Chronic Kidney Disease.   Georgiann Hahn gap 05/08/2017 8  5 - 15 Final   Performed at [facility], [address] [place], [place] [address]  . WBC 05/08/2017 4.1  3.6 - 11.0 K/uL Final  . RBC 05/08/2017 3.06* 3.80 - 5.20 MIL/uL Final  . Hemoglobin 05/08/2017 9.1* 12.0 - 16.0 g/dL Final  . HCT 05/08/2017 27.2* 35.0 - 47.0 % Final  . MCV 05/08/2017 88.9  80.0 - 100.0 fL Final  . MCH 05/08/2017 29.8  26.0 - 34.0 pg Final  . MCHC 05/08/2017 33.5  32.0 - 36.0 g/dL Final  . RDW 05/08/2017 20.3* 11.5 -  14.5 % Final  . Platelets 05/08/2017 271  150 - 440 K/uL Final  . Neutrophils Relative % 05/08/2017 64  % Final  . Neutro Abs 05/08/2017 2.6  1.4 - 6.5 K/uL Final  . Lymphocytes Relative 05/08/2017 18  % Final  . Lymphs Abs 05/08/2017 0.7* 1.0 - 3.6 K/uL Final  . Monocytes Relative 05/08/2017 16  % Final  . Monocytes Absolute 05/08/2017 0.6  0.2 - 0.9 K/uL Final  . Eosinophils Relative 05/08/2017 1  % Final  . Eosinophils Absolute 05/08/2017 0.0  0 - 0.7 K/uL Final  . Basophils Relative 05/08/2017 1  % Final  . Basophils Absolute 05/08/2017 0.0  0 - 0.1 K/uL Final   Performed at [facility], [address] [place], [place] [address]    Assessment:  TRENITY PHA is a 72 y.o. female with progressive stage IV ovarian cancer.  She presented with abdominal discomfort and bloating.  Omental biopsy on 02/23/2015 revealed metastatic high grade serous carcinoma, consistent with gynecologic origin.   She was initially diagnosed with clinical stage IIIC (T3cN1Mx).  CA125 was 707 on 02/17/2015.  She received 4 cycles of neoadjuvant carboplatin and Taxol (03/05/2015 - 05/22/2015).  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  She had loose stools on oral magnesium.  She was initially on Neurontin then switched Lyrica with cycle #3.  Cycle #4 was notable for neutropenia (ANC 300) requiring GCSF x 3 days.    She underwent exploratory laparotomy, lysis of adhesions, total abdominal hysterectomy with bilateral salpingo-oophorectomy, infracolic omentectomy, optimal tumor debulking(< 1 cm), recto-sigmoid resection with creation of end colostomy, cholecystectomy, mobilization of splenic  flexure and liver with diaphragmatic stripping on 06/15/2015. The right diaphragm was cleared of tumor. During dissection, the diaphragm was entered and closed with sutures.  She received tamoxifen from 02/25/2016 - 03/14/2016.  She received 4 cycles of carboplatin and gemcitabine (03/21/2016 - 05/24/2016) with  GCSF/Neulasta support.  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  Cymbalta began on 02/24/2017  PDL-1 testing revealed a combined score was 5 (>=1 positive).     [facility] genetic testing negative for APC, ATM, BMPR1A, BRCA1, BRCA2, BRIP1, CDH1, CDK4, CDKN2A, CHEK2, EPCAM, MLH1, MSH2, MSH6, MUTYH, NBN, PALB2, PMS2, PTEN, RAD51C, RAD51D, SMAD4, STK11, TP53. Sequencing for select regions of POLE and POLD1, and large rearrangement analysis for select regions of GREM1 were negative.  She received 2 cycles of Doxil (06/21/2016 - 07/19/2016) with Neulasta support.  She received 4 cycles of topotecan with Neulasta support (08/22/2016 - 10/10/2016).    She received 3 cycles of pembrolizumab (Keytruda) from 12/06/2016 -  01/09/2017.  Cycle #1 was complicated by neutropenia (ANC 400) requiring GCSF/Granix x 3 days.  She received GCSF x 5 days with cycle #2.  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 Right lower chest pleural metastasis and several of the peritoneal tumor implants had increased.  There was new right inguinal lymphadenopathy.  Some of the peritoneal tumor implants had decreased.  The small dependent left pleural effusion was slightly increased .  The mild left hydroureteronephrosis was improved.  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   The chronic biliary ductal dilatation was stable .  There was no evidence of bowel obstruction.  CA125 was 802.9 on 03/30/2015, 567.9 on 04/13/2015, 168.8 on 05/15/2015, 85.2 on 07/17/2015, 68.6 on 07/28/2015, 34.5 on 09/17/2015, 20.7 on 11/06/2015, 49 on 01/22/2016, 106.1 on 02/22/2016, 138.8 on 03/07/2016, 220.8 on 03/21/2016, 152.8 on 04/11/2016, 125.6 on 04/18/2016, 76.8 on 05/03/2016, 60.2 on 05/24/2016, 44 on 07/19/2016, 65.8 on 08/17/2016, 53.4 on 09/12/2016, 47.3 on 10/10/2016, 91 on  12/20/2016, 141.1 on 01/09/2017, 463.7 on 02/27/2017, and 907.1 on 03/27/2017.  She has a history of recurrent right sided pleural effusion.  She underwent thoracentesis of 650 cc on post-operative day 3.  She was admitted to [facility] on 07/01/2015 and 07/07/2015 for recurrent shortness of breath.  She underwent 2 additional thoracenteses (1.1 L on 07/02/2015 and 850 cc on 07/08/2015).  Cytology was negative x 2.  Bilateral lower extremity duplex on 07/03/2015 was negative.  Echo revealed an EF of 55-60% on 07/08/2015 and 60-65% on 05/27/2016.    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 She was on Lovenox 60 mg twice a day.  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  She has severe rheumatoid arthritis.  Methotrexate and Enbrel were initially on hold.  She has a normocytic anemia.  Work-up on 02/17/2015 and 05/24/2016 revealed a normal ferritin, B12, folate, TSH.  She denies any melena or hematochezia.    She has anemia due to chronic disease. She received 1 unit PRBCs during her admission at [facility]. She denies any melena or hematochezia. She has diabetes and is on an insulin pump.  She has had persistent neutropenia felt secondary to her rheumatoid arthritis.  Folate and MMA were normal.  TSH was 6.13 (high) with a free T4 of 1.17 (0.61-1.12).  She began methotrexate (10 mg a week) and prednisone (5 mg a day) for severe rheumatoid arthritis on 12/17/2015.  She remains on prednisone alone (5 mg a day).  Bone marrow aspirate and biopsy on 06/09/2010 revealed a hypercellular marrow (70%) with no evidence of dysplasia or malignancy.  Flow cytometry was negative.  Cytogenetics were normal (46,XX).  FISH studies were negative for MDS.  Bone marrow aspirate and biopsy on 12/03/2015 revealed a normocellular to mildy  hypercellular marrow for age (40%) with left shifted myelopoiesis, non specific dyserythropoiesis and mild megakaryocytic atypia with no increase in blasts.  There were multiple small nonspecific lymphoid aggregates (favor reactive). There was no increase in reticulin.  There was decreased myeloid cells (37%) with left shifted maturation and 1% atypical myelod blasts.  There was relatively increased monocytic cells (11%), relatively increased lymphoid cells (36%), and relatively increased eosinophils (6%).  Cytogenetics were normal (6, XX).  SNP microarray was normal.  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  She receives IV magnesium weekly.  She has chemotherapy induced anemia.  She has received Procrit (last 11/07/2016).  Labs on 08/22/2016 revealed a normal B12 and folate.  Ferritin was 37, iron saturation 11% and TIBC 281.  Code status is DNR/DNI.  She developed acute LFT elevation on 11/28/2016.  Event was preceded by upper abdominal discomfort.  Hepatitis B core antibody total and hepatitis C antibody were negative.  She is s/p day 1 of cycle #3 Abraxane (04/11/2017).  Cycle #1 was truncated after day 1 secondary to side effects and Thanksgiving.  Symptomatically, she feels "good".  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  Exam is stable.  WBC is 4100 with an [place] of 2600. Hemoglobin is 9.1, hematocrit 27.2, and platelets 271,000.  Magnesium is 1.2 (low).   Plan: 1.   Labs today:  CBC with diff, CMP, Mg 2.   Blood counts stable and adequate for treatment. Will proceed with day 8 of cycle #3 Abraxane.  3.   Magnesium low at 1.2. Will replace with 6 grams of IV magnesium. Patient prefers pump. RTC tomorrow for pump disconnect.  4.   Review plans for repeat CT imaging of the chest, abdomen, and pelvis. Tumor marker continues to increase to 907.1. Repeat imaging scheduled for 05/22/2017. 5.   Discuss nutrition. Patient has lost 3  pounds since her last treatment. Encouraged patient to increase intake of protein and calorie dense foods. Also encouraged supplement shakes.  6.   RTC on 01/08, 01/09, and 01/10 for GCSF. 7.   RTC on 05/11/2017 for labs (Mg) and +/- IV Mg. 8.   RTC on 05/15/2017 or MD assessment, labs, (CBC with diff, CMP, Mg), day 15 of cycle #3 Abraxane with OnPro Neulasta support, and +/- IV Mg   Honor Loh, NP  05/08/2017, 12:15 PM

## 2017-05-09 ENCOUNTER — Inpatient Hospital Stay: Payer: Medicare Other

## 2017-05-09 DIAGNOSIS — C561 Malignant neoplasm of right ovary: Secondary | ICD-10-CM | POA: Diagnosis not present

## 2017-05-09 MED ORDER — TBO-FILGRASTIM 300 MCG/0.5ML ~~LOC~~ SOSY
300.0000 ug | PREFILLED_SYRINGE | Freq: Once | SUBCUTANEOUS | Status: AC
  Administered 2017-05-09: 300 ug via SUBCUTANEOUS
  Filled 2017-05-09: qty 0.5

## 2017-05-09 MED ORDER — HEPARIN SOD (PORK) LOCK FLUSH 100 UNIT/ML IV SOLN
500.0000 [IU] | Freq: Once | INTRAVENOUS | Status: AC
  Administered 2017-05-09: 500 [IU] via INTRAVENOUS
  Filled 2017-05-09: qty 5

## 2017-05-09 MED ORDER — SODIUM CHLORIDE 0.9% FLUSH
10.0000 mL | INTRAVENOUS | Status: DC | PRN
  Administered 2017-05-09: 10 mL via INTRAVENOUS
  Filled 2017-05-09: qty 10

## 2017-05-10 ENCOUNTER — Inpatient Hospital Stay: Payer: Medicare Other

## 2017-05-10 ENCOUNTER — Ambulatory Visit: Payer: Medicare Other

## 2017-05-10 DIAGNOSIS — C561 Malignant neoplasm of right ovary: Secondary | ICD-10-CM | POA: Diagnosis not present

## 2017-05-10 MED ORDER — TBO-FILGRASTIM 300 MCG/0.5ML ~~LOC~~ SOSY
300.0000 ug | PREFILLED_SYRINGE | Freq: Once | SUBCUTANEOUS | Status: AC
  Administered 2017-05-10: 300 ug via SUBCUTANEOUS

## 2017-05-10 NOTE — Progress Notes (Signed)
Patient received Granix today and is scheduled for lab/possible mag/Granix tomorrow morning.  Confirmed with pharmacy, [facility], and it does not have to be 24 hours between injections.

## 2017-05-11 ENCOUNTER — Inpatient Hospital Stay: Payer: Medicare Other

## 2017-05-11 ENCOUNTER — Ambulatory Visit: Payer: Medicare Other

## 2017-05-11 DIAGNOSIS — C800 Disseminated malignant neoplasm, unspecified: Principal | ICD-10-CM

## 2017-05-11 DIAGNOSIS — C561 Malignant neoplasm of right ovary: Secondary | ICD-10-CM

## 2017-05-11 DIAGNOSIS — C569 Malignant neoplasm of unspecified ovary: Secondary | ICD-10-CM

## 2017-05-11 LAB — MAGNESIUM: Magnesium: 1.4 mg/dL — ABNORMAL LOW (ref 1.7–2.4)

## 2017-05-11 LAB — BASIC METABOLIC PANEL
Anion gap: 8 (ref 5–15)
BUN: 18 mg/dL (ref 6–20)
CO2: 27 mmol/L (ref 22–32)
Calcium: 8.8 mg/dL — ABNORMAL LOW (ref 8.9–10.3)
Chloride: 98 mmol/L — ABNORMAL LOW (ref 101–111)
Creatinine, Ser: 0.7 mg/dL (ref 0.44–1.00)
GFR calc Af Amer: >60 mL/min (ref >60)
GFR calc non Af Amer: >60 mL/min (ref >60)
Glucose, Bld: 169 mg/dL — ABNORMAL HIGH (ref 65–99)
Potassium: 3.8 mmol/L (ref 3.5–5.1)
Sodium: 133 mmol/L — ABNORMAL LOW (ref 135–145)

## 2017-05-11 MED ORDER — TBO-FILGRASTIM 300 MCG/0.5ML ~~LOC~~ SOSY
300.0000 ug | PREFILLED_SYRINGE | Freq: Once | SUBCUTANEOUS | Status: AC
  Administered 2017-05-11: 300 ug via SUBCUTANEOUS
  Filled 2017-05-11: qty 0.5

## 2017-05-11 MED ORDER — SODIUM CHLORIDE 0.9 % IV SOLN
6.0000 g | Freq: Once | INTRAVENOUS | Status: DC
  Administered 2017-05-11: 6 g via INTRAVENOUS
  Filled 2017-05-11: qty 10

## 2017-05-12 ENCOUNTER — Ambulatory Visit: Payer: Medicare Other

## 2017-05-12 ENCOUNTER — Ambulatory Visit (INDEPENDENT_AMBULATORY_CARE_PROVIDER_SITE_OTHER): Payer: Medicare Other | Admitting: Family Medicine

## 2017-05-12 ENCOUNTER — Encounter: Payer: Self-pay | Admitting: Family Medicine

## 2017-05-12 ENCOUNTER — Other Ambulatory Visit: Payer: Self-pay

## 2017-05-12 ENCOUNTER — Inpatient Hospital Stay: Payer: Medicare Other

## 2017-05-12 ENCOUNTER — Other Ambulatory Visit: Payer: Medicare Other

## 2017-05-12 VITALS — BP 132/69 | HR 67 | Temp 98.2°F | Wt 132.0 lb

## 2017-05-12 DIAGNOSIS — I491 Atrial premature depolarization: Secondary | ICD-10-CM | POA: Insufficient documentation

## 2017-05-12 DIAGNOSIS — I499 Cardiac arrhythmia, unspecified: Secondary | ICD-10-CM

## 2017-05-12 DIAGNOSIS — C561 Malignant neoplasm of right ovary: Secondary | ICD-10-CM | POA: Diagnosis not present

## 2017-05-12 DIAGNOSIS — C569 Malignant neoplasm of unspecified ovary: Secondary | ICD-10-CM

## 2017-05-12 DIAGNOSIS — Z95828 Presence of other vascular implants and grafts: Secondary | ICD-10-CM

## 2017-05-12 DIAGNOSIS — G620 Drug-induced polyneuropathy: Secondary | ICD-10-CM

## 2017-05-12 DIAGNOSIS — T451X5A Adverse effect of antineoplastic and immunosuppressive drugs, initial encounter: Secondary | ICD-10-CM | POA: Diagnosis not present

## 2017-05-12 DIAGNOSIS — E1059 Type 1 diabetes mellitus with other circulatory complications: Secondary | ICD-10-CM

## 2017-05-12 DIAGNOSIS — I10 Essential (primary) hypertension: Secondary | ICD-10-CM | POA: Diagnosis not present

## 2017-05-12 MED ORDER — SODIUM CHLORIDE 0.9% FLUSH
10.0000 mL | INTRAVENOUS | Status: DC | PRN
  Administered 2017-05-12: 10 mL via INTRAVENOUS
  Filled 2017-05-12: qty 10

## 2017-05-12 MED ORDER — DULOXETINE HCL 20 MG PO CPEP: 20.0000 mg | ORAL_CAPSULE | Freq: Every day | ORAL | 2 refills | Status: AC

## 2017-05-12 MED ORDER — HEPARIN SOD (PORK) LOCK FLUSH 100 UNIT/ML IV SOLN
500.0000 [IU] | Freq: Once | INTRAVENOUS | Status: AC
  Administered 2017-05-12: 500 [IU] via INTRAVENOUS
  Filled 2017-05-12: qty 5

## 2017-05-12 NOTE — Patient Instructions (Signed)
Nice to see you. Please monitor your blood pressure and blood sugars.  If you continue to have issues with low blood sugars please contact your endocrinologist.  If you notice your blood pressure is dropping below 60 on the bottom number please let us know.  If you start to get more lightheaded please let us know as well.

## 2017-05-12 NOTE — Assessment & Plan Note (Signed)
Noted on EKG.  Suspect this is the cause of the irregularity on exam.  Asymptomatic.  She will monitor.

## 2017-05-12 NOTE — Assessment & Plan Note (Signed)
On Cymbalta.  Refill given.

## 2017-05-12 NOTE — Assessment & Plan Note (Signed)
Slightly low diastolically today.  She has been borderline normal at home. Notes occasional lightheadedness.  Discussed monitoring her blood pressures and if she has more frequent lightheadedness or her blood pressures become lower at home letting us know.

## 2017-05-12 NOTE — Progress Notes (Signed)
Janet Rumps, MD Phone: [phone]  [name] is a 72 y.o. female who presents today for follow-up.  Type 1 diabetes: Follows with endocrinology.  Blood sugars vary anywhere between 60 and 200.  On 5-6 occasions over the last month she has dropped down into the 60s.  Typically before her evening meal.  She will drink some juice and it will improve.  No polyuria or polydipsia.  Last A1c was 7.1.  Hypertension: Blood pressure has typically been around 130/60.  Rarely into the 41D at home diastolically.  Takes metoprolol twice daily.  Takes Lasix once a week or so.  No chest pain.  Chronic shortness of breath that is unchanged.  Rarely gets lightheaded.  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  Bothers her hands and feet.  Fairly well controlled with Cymbalta.  She needs a refill.  She is being treated through oncology for ovarian cancer.  She has been doing chemotherapy once weekly.  She reports this is going fairly well and she has tolerated this fairly well.  She states this is the last option for treatment.  Irregular heartbeat noted on exam.  She notes no palpitations.  Social History   Tobacco Use  Smoking Status Former Smoker  . Packs/day: 1.00  . Years: 30.00  . Pack years: 30.00  . Types: Cigarettes  . Last attempt to quit: 06/11/1994  . Years since quitting: 22.9  Smokeless Tobacco Never Used     ROS see history of present illness  Objective  Physical Exam Vitals:   05/12/17 1053  BP: 132/69  Pulse: 67  Temp: 98.2 F (36.8 C)  SpO2: 96%    BP Readings from Last 3 Encounters:  05/12/17 132/69  05/11/17 113/68  05/09/17 105/69   Wt Readings from Last 3 Encounters:  05/12/17 132 lb (59.9 kg)  05/08/17 131 lb 2 oz (59.5 kg)  05/01/17 128 lb 11.2 oz (58.4 kg)    Physical Exam  Constitutional: No distress.  Cardiovascular: Normal heart sounds.  Periods of regular rhythm with several extra beats followed by regular rhythm and then  more extra beats  Pulmonary/Chest: Effort normal and breath sounds normal.  Musculoskeletal: She exhibits no edema.  Neurological: She is alert. Gait normal.  Skin: Skin is warm and dry. She is not diaphoretic.   EKG: Normal sinus rhythm, 2 PACs noted, baseline wander noted, no apparent ischemic changes  Assessment/Plan: Please see individual problem list.  Hypertension Slightly low diastolically today.  She has been borderline normal at home. Notes occasional lightheadedness.  Discussed monitoring her blood pressures and if she has more frequent lightheadedness or her blood pressures become lower at home letting us know.  PAC (premature atrial contraction) Noted on EKG.  Suspect this is the cause of the irregularity on exam.  Asymptomatic.  She will monitor.  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  She is following consistently with oncology.  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  Refill given.  Diabetes type 1, controlled ([place]) Seems to be well controlled.  Most recent A1c through her endocrinologist.  She sees them in February.  She will monitor her lows and if they continue or become more frequent she will contact her endocrinologist to consider adjustment on her insulin pump.   [name] was seen today for follow-up.  Diagnoses and all orders for this visit:  Irregular heartbeat -     EKG 12-Lead  Essential hypertension  PAC (premature atrial contraction)  Malignant neoplasm of ovary, unspecified  laterality ([place])  Chemotherapy-induced neuropathy ([place])  Controlled type 1 diabetes mellitus with other circulatory complication ([place])  Other orders -     DULoxetine (CYMBALTA) 20 MG capsule; Take 1 capsule (20 mg total) by mouth daily.    Orders Placed This Encounter  Procedures  . EKG 12-Lead    Meds ordered this encounter  Medications  . DULoxetine (CYMBALTA) 20 MG capsule    Sig: Take 1 capsule (20 mg total) by mouth daily.    Dispense:   90 capsule    Refill:  2     Janet Rumps, MD [place]

## 2017-05-12 NOTE — Assessment & Plan Note (Signed)
On chemotherapy.  She is following consistently with oncology.

## 2017-05-12 NOTE — Assessment & Plan Note (Signed)
Seems to be well controlled.  Most recent A1c through her endocrinologist.  She sees them in February.  She will monitor her lows and if they continue or become more frequent she will contact her endocrinologist to consider adjustment on her insulin pump.

## 2017-05-15 ENCOUNTER — Other Ambulatory Visit: Payer: Self-pay | Admitting: Cardiovascular Disease

## 2017-05-15 ENCOUNTER — Encounter: Payer: Self-pay | Admitting: Hematology and Oncology

## 2017-05-15 ENCOUNTER — Inpatient Hospital Stay (HOSPITAL_BASED_OUTPATIENT_CLINIC_OR_DEPARTMENT_OTHER): Payer: Medicare Other | Admitting: Hematology and Oncology

## 2017-05-15 ENCOUNTER — Inpatient Hospital Stay: Payer: Medicare Other

## 2017-05-15 VITALS — BP 127/65 | HR 88 | Temp 98.0°F | Resp 16 | Wt 130.0 lb

## 2017-05-15 DIAGNOSIS — Z8701 Personal history of pneumonia (recurrent): Secondary | ICD-10-CM

## 2017-05-15 DIAGNOSIS — D72819 Decreased white blood cell count, unspecified: Secondary | ICD-10-CM

## 2017-05-15 DIAGNOSIS — Z794 Long term (current) use of insulin: Secondary | ICD-10-CM

## 2017-05-15 DIAGNOSIS — Z90710 Acquired absence of both cervix and uterus: Secondary | ICD-10-CM

## 2017-05-15 DIAGNOSIS — E109 Type 1 diabetes mellitus without complications: Secondary | ICD-10-CM

## 2017-05-15 DIAGNOSIS — D61818 Other pancytopenia: Secondary | ICD-10-CM

## 2017-05-15 DIAGNOSIS — C561 Malignant neoplasm of right ovary: Secondary | ICD-10-CM | POA: Diagnosis not present

## 2017-05-15 DIAGNOSIS — G629 Polyneuropathy, unspecified: Secondary | ICD-10-CM | POA: Diagnosis not present

## 2017-05-15 DIAGNOSIS — C7919 Secondary malignant neoplasm of other urinary organs: Secondary | ICD-10-CM | POA: Diagnosis not present

## 2017-05-15 DIAGNOSIS — R948 Abnormal results of function studies of other organs and systems: Secondary | ICD-10-CM | POA: Diagnosis not present

## 2017-05-15 DIAGNOSIS — I2510 Atherosclerotic heart disease of native coronary artery without angina pectoris: Secondary | ICD-10-CM

## 2017-05-15 DIAGNOSIS — Z87442 Personal history of urinary calculi: Secondary | ICD-10-CM

## 2017-05-15 DIAGNOSIS — I10 Essential (primary) hypertension: Secondary | ICD-10-CM

## 2017-05-15 DIAGNOSIS — C800 Disseminated malignant neoplasm, unspecified: Principal | ICD-10-CM

## 2017-05-15 DIAGNOSIS — Z7952 Long term (current) use of systemic steroids: Secondary | ICD-10-CM

## 2017-05-15 DIAGNOSIS — D631 Anemia in chronic kidney disease: Secondary | ICD-10-CM

## 2017-05-15 DIAGNOSIS — Z87891 Personal history of nicotine dependence: Secondary | ICD-10-CM

## 2017-05-15 DIAGNOSIS — C786 Secondary malignant neoplasm of retroperitoneum and peritoneum: Secondary | ICD-10-CM

## 2017-05-15 DIAGNOSIS — E876 Hypokalemia: Secondary | ICD-10-CM

## 2017-05-15 DIAGNOSIS — I509 Heart failure, unspecified: Secondary | ICD-10-CM

## 2017-05-15 DIAGNOSIS — K219 Gastro-esophageal reflux disease without esophagitis: Secondary | ICD-10-CM

## 2017-05-15 DIAGNOSIS — E785 Hyperlipidemia, unspecified: Secondary | ICD-10-CM

## 2017-05-15 DIAGNOSIS — G620 Drug-induced polyneuropathy: Secondary | ICD-10-CM

## 2017-05-15 DIAGNOSIS — T451X5A Adverse effect of antineoplastic and immunosuppressive drugs, initial encounter: Secondary | ICD-10-CM

## 2017-05-15 DIAGNOSIS — D6481 Anemia due to antineoplastic chemotherapy: Secondary | ICD-10-CM

## 2017-05-15 DIAGNOSIS — I998 Other disorder of circulatory system: Secondary | ICD-10-CM

## 2017-05-15 DIAGNOSIS — R634 Abnormal weight loss: Secondary | ICD-10-CM

## 2017-05-15 DIAGNOSIS — Z7689 Persons encountering health services in other specified circumstances: Secondary | ICD-10-CM | POA: Diagnosis not present

## 2017-05-15 DIAGNOSIS — Z5111 Encounter for antineoplastic chemotherapy: Secondary | ICD-10-CM

## 2017-05-15 DIAGNOSIS — J90 Pleural effusion, not elsewhere classified: Secondary | ICD-10-CM

## 2017-05-15 DIAGNOSIS — Z90722 Acquired absence of ovaries, bilateral: Secondary | ICD-10-CM

## 2017-05-15 DIAGNOSIS — I252 Old myocardial infarction: Secondary | ICD-10-CM

## 2017-05-15 DIAGNOSIS — C569 Malignant neoplasm of unspecified ovary: Secondary | ICD-10-CM

## 2017-05-15 DIAGNOSIS — R0602 Shortness of breath: Secondary | ICD-10-CM

## 2017-05-15 DIAGNOSIS — K222 Esophageal obstruction: Secondary | ICD-10-CM

## 2017-05-15 DIAGNOSIS — Z801 Family history of malignant neoplasm of trachea, bronchus and lung: Secondary | ICD-10-CM

## 2017-05-15 DIAGNOSIS — D709 Neutropenia, unspecified: Secondary | ICD-10-CM | POA: Diagnosis not present

## 2017-05-15 DIAGNOSIS — M069 Rheumatoid arthritis, unspecified: Secondary | ICD-10-CM

## 2017-05-15 DIAGNOSIS — Z79899 Other long term (current) drug therapy: Secondary | ICD-10-CM

## 2017-05-15 DIAGNOSIS — Z7189 Other specified counseling: Secondary | ICD-10-CM

## 2017-05-15 DIAGNOSIS — C801 Malignant (primary) neoplasm, unspecified: Secondary | ICD-10-CM

## 2017-05-15 DIAGNOSIS — K449 Diaphragmatic hernia without obstruction or gangrene: Secondary | ICD-10-CM

## 2017-05-15 LAB — CBC WITH DIFFERENTIAL/PLATELET
Basophils Absolute: 0 10*3/uL (ref 0–0.1)
Basophils Relative: 1 %
Eosinophils Absolute: 0 10*3/uL (ref 0–0.7)
Eosinophils Relative: 0 %
HCT: 26.4 % — ABNORMAL LOW (ref 35.0–47.0)
Hemoglobin: 8.7 g/dL — ABNORMAL LOW (ref 12.0–16.0)
Lymphocytes Relative: 18 %
Lymphs Abs: 0.6 10*3/uL — ABNORMAL LOW (ref 1.0–3.6)
MCH: 29.4 pg (ref 26.0–34.0)
MCHC: 32.8 g/dL (ref 32.0–36.0)
MCV: 89.6 fL (ref 80.0–100.0)
Monocytes Absolute: 0.4 10*3/uL (ref 0.2–0.9)
Monocytes Relative: 14 %
Neutro Abs: 2 10*3/uL (ref 1.4–6.5)
Neutrophils Relative %: 67 %
Platelets: 258 10*3/uL (ref 150–440)
RBC: 2.94 MIL/uL — ABNORMAL LOW (ref 3.80–5.20)
RDW: 20 % — ABNORMAL HIGH (ref 11.5–14.5)
WBC: 3 10*3/uL — ABNORMAL LOW (ref 3.6–11.0)

## 2017-05-15 LAB — COMPREHENSIVE METABOLIC PANEL
ALT: 10 U/L — ABNORMAL LOW (ref 14–54)
AST: 14 U/L — ABNORMAL LOW (ref 15–41)
Albumin: 3.2 g/dL — ABNORMAL LOW (ref 3.5–5.0)
Alkaline Phosphatase: 93 U/L (ref 38–126)
Anion gap: 8 (ref 5–15)
BUN: 18 mg/dL (ref 6–20)
CO2: 26 mmol/L (ref 22–32)
Calcium: 8.9 mg/dL (ref 8.9–10.3)
Chloride: 99 mmol/L — ABNORMAL LOW (ref 101–111)
Creatinine, Ser: 0.68 mg/dL (ref 0.44–1.00)
GFR calc Af Amer: >60 mL/min (ref >60)
GFR calc non Af Amer: >60 mL/min (ref >60)
Glucose, Bld: 342 mg/dL — ABNORMAL HIGH (ref 65–99)
Potassium: 3.9 mmol/L (ref 3.5–5.1)
Sodium: 133 mmol/L — ABNORMAL LOW (ref 135–145)
Total Bilirubin: 0.5 mg/dL (ref 0.3–1.2)
Total Protein: 6.7 g/dL (ref 6.5–8.1)

## 2017-05-15 LAB — MAGNESIUM: Magnesium: 1.2 mg/dL — ABNORMAL LOW (ref 1.7–2.4)

## 2017-05-15 MED ORDER — PEGFILGRASTIM 6 MG/0.6ML ~~LOC~~ PSKT
6.0000 mg | PREFILLED_SYRINGE | Freq: Once | SUBCUTANEOUS | Status: DC
  Filled 2017-05-15: qty 0.6

## 2017-05-15 MED ORDER — SODIUM CHLORIDE 0.9 % IV SOLN
6.0000 g | Freq: Once | INTRAVENOUS | Status: DC
  Administered 2017-05-15: 6 g via INTRAVENOUS
  Filled 2017-05-15: qty 10

## 2017-05-15 MED ORDER — HEPARIN SOD (PORK) LOCK FLUSH 100 UNIT/ML IV SOLN
500.0000 [IU] | Freq: Once | INTRAVENOUS | Status: DC
  Filled 2017-05-15: qty 5

## 2017-05-15 MED ORDER — PACLITAXEL PROTEIN-BOUND CHEMO INJECTION 100 MG
80.0000 mg/m2 | Freq: Once | Status: DC
  Filled 2017-05-15: qty 25

## 2017-05-15 MED ORDER — ONDANSETRON HCL 4 MG PO TABS
8.0000 mg | ORAL_TABLET | Freq: Once | ORAL | Status: AC
  Administered 2017-05-15: 8 mg via ORAL
  Filled 2017-05-15: qty 2

## 2017-05-15 MED ORDER — PACLITAXEL PROTEIN-BOUND CHEMO INJECTION 100 MG
80.0000 mg/m2 | Freq: Once | INTRAVENOUS | Status: AC
  Administered 2017-05-15: 125 mg via INTRAVENOUS
  Filled 2017-05-15: qty 25

## 2017-05-15 MED ORDER — PEGFILGRASTIM INJECTION 6 MG/0.6ML ~~LOC~~: 6.0000 mg | PREFILLED_SYRINGE | Freq: Once | SUBCUTANEOUS | Status: DC

## 2017-05-15 MED ORDER — SODIUM CHLORIDE 0.9 % IV SOLN
Freq: Once | INTRAVENOUS | Status: AC
  Administered 2017-05-15: 10:00:00 via INTRAVENOUS
  Filled 2017-05-15: qty 1000

## 2017-05-15 MED ORDER — SODIUM CHLORIDE 0.9% FLUSH
10.0000 mL | Freq: Once | INTRAVENOUS | Status: AC
  Administered 2017-05-15: 10 mL via INTRAVENOUS
  Filled 2017-05-15: qty 10

## 2017-05-15 NOTE — Progress Notes (Signed)
[facility] day:  05/15/2017   Chief Complaint: [name] is a 72 y.o. female with stage IV ovarian cancer who is seen for assessment prior to day 15 of cycle #3 Abraxane.  HPI:  The patient was last seen in the medical oncology clinic on 05/08/2017 by Honor Loh, NP.  At that time, she felt "good".  Her grade II neuropathy was stable and not limiting her functioning.  Exam was stable.  WBC was 4100 with an [place] of 2600. Hemoglobin was 9.1, hematocrit 27.2, and platelets 271,000.  Magnesium was 1.2 (low).  She received day 8 Abraxane and magnesium 6 gm IV.  She received GCSF x 3 days.  During the interim, patient is doing well overall. She denies any acute physical concerns. She notes that her neuropathy is "about the same". Patient denies any B symptoms or interval infections. Patient is eating well. Her weight has decreased by 2 pounds since her last clinic visit.    Past Medical History:  Diagnosis Date  . CAD (coronary artery disease)    a. 09/2012 Cath: LM nl, LAD 95p, 81m LCX 957mOM2 50, RCA 100.  . Marland KitchenHF (congestive heart failure) ([facility]  . Cholelithiasis   . Collagen vascular disease (HCC)    Rheumatoid Arthritis.  . Esophageal stricture   . Exertional shortness of breath   . GERD (gastroesophageal reflux disease)   . H/O hiatal hernia   . Herniated disc   . History of pancytopenia   . Hyperlipidemia   . Hypertension   . Hypokalemia   . Leukopenia 2012   s/p bone marrow biopsy, Dr. PaMa Hillock. NSTEMI (non-ST elevated myocardial infarction) (HC[facility]air5/2014   "mild" (10/18/2012)  . Ovarian cancer ([facility]2016   chemo and hysterectomy  . Pneumonia 2013; 08/2012   "one lung; double" (10/18/2012)  . Rheumatoid arthritis(714.0)   . Type I diabetes mellitus ([facility]   "dx'd in 1957" (10/18/2012)    Past Surgical History:  Procedure Laterality Date  . ABDOMINAL HYSTERECTOMY  06/15/2015   Dx L/S, EXLAP TAH BSO omentectomy RSRx colostomy  diaphragm resection stripping  . CARDIAC CATHETERIZATION  10/18/2012   "first one was today" (10/18/2012)  . CATARACT EXTRACTION W/ INTRAOCULAR LENS IMPLANT Right 2010  . CHOLECYSTECTOMY  06/15/2015   combined case with ovarian cancer debulking  . COLON SURGERY    . CORONARY ARTERY BYPASS GRAFT N/A 10/19/2012   Procedure: CORONARY ARTERY BYPASS GRAFTING (CABG);  Surgeon: StMelrose NakayamaMD;  Location: [facility] Service: Open Heart Surgery;  Laterality: N/A;  . ESOPHAGEAL DILATION     "3 or 4 times" (10/19/2011)  . ESOPHAGOGASTRODUODENOSCOPY  2012   Dr. IfMinna Merritts. OSTOMY    . OVARY SURGERY     removal  . PERIPHERAL VASCULAR CATHETERIZATION N/A 03/02/2015   Procedure: PoGlori Luisath Insertion;  Surgeon: JaAlgernon HuxleyMD;  Location: [facility];  Service: Cardiovascular;  Laterality: N/A;  . TUBAL LIGATION  1970    Family History  Problem Relation Age of Onset  . Diabetes Mother   . Arthritis Mother   . Diabetes Father   . Arthritis Father   . Aneurysm Sister        neck  . Cancer Sister        lung    Social History:  reports that she quit smoking about 22 years ago. Her smoking use included cigarettes. She has a 30.00 pack-year  smoking history. she has never used smokeless tobacco. She reports that she does not drink alcohol or use drugs.  Patient is a retired Art therapist. Patient denies exposure to radiation and toxins. She is accompanied by her husband today.  Allergies:  Allergies  Allergen Reactions  . Codeine Nausea And Vomiting  . Latex Rash    Current Medications: Current Outpatient Medications  Medication Sig Dispense Refill  . Calcium-Vitamin D 600-200 MG-UNIT tablet Take 1 tablet by mouth 2 (two) times daily.     . cholecalciferol (VITAMIN D) 1000 units tablet Take 1,000 Units by mouth daily.    . DULoxetine (CYMBALTA) 20 MG capsule Take 1 capsule (20 mg total) by mouth daily. 90 capsule 2  . enoxaparin (LOVENOX) 60 MG/0.6ML injection INJECT 0.6 ML (60  MG TOTAL) INTO THE SKIN EVERY 12 HOURS 60 mL 3  . folic acid (FOLVITE) 1 MG tablet Take 1 tablet (1 mg total) by mouth daily. 90 tablet 3  . furosemide (LASIX) 40 MG tablet Take 1 tablet (40 mg total) by mouth daily as needed for edema. 30 tablet 1  . insulin lispro (HUMALOG) 100 UNIT/ML injection Inject 0.06 mLs (6 Units total) into the skin daily. (Patient taking differently: Inject 0-85 Units into the skin daily. Via insulin pump) 30 mL 3  . lidocaine-prilocaine (EMLA) cream APPLY 1 APPLICATION TOPICALLY AS NEEDED 1 HOUR PRIOR TO TREATMENT. COVER WITH PRESS N SEAL UNTIL TREATMENT TIME AS DIRECTED 30 g 1  . loratadine (CLARITIN) 10 MG tablet Take 1 tablet by mouth daily as needed.    . magnesium oxide (MAG-OX) 400 (241.3 Mg) MG tablet TAKE 1 TABLET BY MOUTH ONCE DAILY 30 tablet 1  . metoprolol tartrate (LOPRESSOR) 25 MG tablet Take 1 tablet (25 mg total) by mouth 2 (two) times daily. 180 tablet 3  . ondansetron (ZOFRAN) 8 MG tablet TAKE 1 TABLET BY MOUTH EVERY 8 HOURS AS NEEDED FOR NAUSEA OR VOMITING 30 tablet 1  . pantoprazole (PROTONIX) 40 MG tablet TAKE 1 TABLET BY MOUTH TWICE (2) DAILY 180 tablet 1  . potassium chloride SA (K-DUR,KLOR-CON) 20 MEQ tablet TAKE 1 TABLET BY MOUTH ONCE A DAY 90 tablet 1  . predniSONE (DELTASONE) 5 MG tablet Take 5 mg by mouth daily with breakfast.    . vitamin C (ASCORBIC ACID) 500 MG tablet Take 500 mg by mouth daily.    Marland Kitchen zinc gluconate 50 MG tablet Take 50 mg daily by mouth.     Current Facility-Administered Medications  Medication Dose Route Frequency Provider Last Rate Last Dose  . Tbo-Filgrastim (GRANIX) injection 300 mcg  300 mcg Subcutaneous Once Lequita Asal, MD       Facility-Administered Medications Ordered in Other Visits  Medication Dose Route Frequency Provider Last Rate Last Dose  . 0.9 %  sodium chloride infusion   Intravenous Continuous Lequita Asal, MD   Stopped at 04/24/17 1140  . heparin lock flush 100 unit/mL  500 Units  Intravenous Once Corcoran, Melissa C, MD      . magnesium sulfate IVPB 4 g 100 mL  4 g Intravenous Once Faythe Casa E, NP      . sodium chloride 0.9 % injection 10 mL  10 mL Intracatheter PRN Corcoran, Melissa C, MD      . sodium chloride 0.9 % injection 10 mL  10 mL Intravenous PRN Lequita Asal, MD   10 mL at 04/13/15 2831    Review of Systems:  GENERAL:  Feels "well".  No fever, chills or sweats.  Weight down 2 pounds. PERFORMANCE STATUS (ECOG):  1 HEENT:  No visual changes, sore throat, mouth sores or tenderness. Lungs:  No shortness of breath.  No cough.  No hemoptysis. Cardiac:  No chest pain, palpitations, orthopnea, or PND. GI:  No nausea, vomiting, diarrhea, melena or hematochezia. GU:  No urgency, frequency, dysuria, or hematuria. Musculoskeletal:  Back pain.  Severe rheumatoid arthritis (on prednisone).  Osteoporosis.  No back pain. No muscle tenderness. Extremities:  No pain or swelling. Skin:  No increased bruising or bleeding.  No rashes or skin changes. Neuro:  Neuropathy in hands and feet (no change).  No headache, numbness or weakness, balance or coordination issues. Endocrine:  Diabetes on an insulin pump.  Blood sugar elevated on steroids.  Occasional hot flashes.  Psych:  No mood changes, depression or anxiety. Pain:  No focal pain. Review of systems:  All other systems reviewed and found to be negative.  Physical Exam:  Blood pressure 127/65, pulse 88, temperature 98 F (36.7 C), temperature source Tympanic, resp. rate 16, weight 130 lb (59 kg). GENERAL:  Well developed, well nourished woman sitting comfortably in the exam room in no acute distress.  She has a cane at her side. MENTAL STATUS:  Alert and oriented to person, place and time. HEAD:  Wearing a light blue cap.  Alopecia.  Normocephalic, atraumatic, face symmetric, no Cushingoid features. EYES:  Gold rimmed glasses.  Blue eyes.  No conjunctivitis or scleral icterus.  ENT: Oropharynx clear  without lesion. Tongue normal. Mucous membranes moist.  RESPIRATORY: Decreased breath sounds right base.  No wheezes or rhonchi. CARDIOVASCULAR: Regular rate and rhythm without murmur, rub or gallop. ABDOMEN: Soft, , non-tender with active bowel sounds and no appreciable hepatosplenomegaly. No palpable nodularity or masses.  Insulin pump.  Ostomy bag. SKIN: No rashes or ulcers. EXTREMITIES:  No edema, skin discoloration or tenderness. No palpable cords. LYMPH NODES: No palpable cervical, supraclavicular, axillary or inguinal adenopathy  NEUROLOGICAL: Appropriate. PSYCH: Appropriate.   Radiology studies: 02/13/2015:  Abdomen and pelvic CT revealed bilateral mass-like adnexal regions (right adnexal mass 5.6 x 5.0 cm and the left adnexa mass 10.3 x 6.0 x 9.9 cm).  There was a large amount of soft tissue throughout the peritoneal cavity involving the omentum and other peritoneal surfaces.  There was a small volume ascites. There was a peripheral 3.3 x 1.9 cm low-attenuation lesion overlying the right lobe of the liver, likely representing a serosal implant. There was 1.4 x 2.2 cm ill-defined peripheral lesion within the inferior aspect of segment 6 adjacent to the ampulla in the duodenum.  04/28/2015:  Abdominal and pelvic CT revealed decreasing bilateral ovarian masses.   The left adnexal mass measured 4.0 x 6.1 cm (previously 5.0 x 8.5 cm). The right ovary measured 3.8 x 4.9 cm (previously 4.7 x 5.6 cm).  There was improved peritoneal carcinomatosis.  There was a small amount of ascites.  The hepatic dome lesion was stable. The previously seen right hepatic lobe lesion was not well-visualized.  There was a small left pleural effusion and trace right pleural effusion.  The nodular lesion at the ampulla of Vater, extending into the duodenum was stable.  There was no biliary ductal dilatation. 08/01/2015:  Rght upper extremity ultrasound revealed a near occlusive thrombus within the central portion  of the right internal jugular vein and central portion of the right subclavian vein. 09/01/2015:  Chest, abdomen, and pelvic CT revealed continued decrease in perihepatic fluid  collection contiguous with the right pleural space with percutaneous drain.  09/11/2015:  Chest, abdomen, and pelvic CT revealed a residual versus recurrent fluid collection posterior to the right hepatic lobe (2.6 x 1.1 x 4.0 cm).   10/13/2015:  Chest, abdomen, and pelvic CT revealed resolution of the empyema. 02/15/2016:  Chest, abdomen, and pelvic CT revealed multiple soft tissue nodules throughout the pelvis, small bowel mesenteric and peritoneum and, concerning for peritoneal metastatic disease.  There was soft tissue irregularity within the left upper quadrant.  There was mild right hydronephrosis (etiology unclear).  There was interval resolution of previously described right pleural based fluid and gas collection. There was a pleural based nodule within the right lower hemi-thorax concerning for pleural based metastasis.  There were small bilateral pleural effusions.  There was pulmonary nodularity, predominately within the left upper lobe (metastatic or infectious/inflammatory etiology).  There was slightly increased mediastinal adenopathy (infectious/inflammatory or metastatic).  There was a low attenuation lesion within the left hepatic lobe (complicated fluid within the fissure or metastatic disease). 03/08/2016:  Abdomen and pelvic CT revealed mild progression of peritoneal disease in the abdomen.  There was interval increase in loculated fluid around the lateral segment left liver, stomach, and spleen.  There was persistent soft tissue lesion at the level of the ampulla with mild intra and extrahepatic biliary duct dilatation.  There was persistent intrahepatic and capsular metastatic disease involving the liver. 05/26/2016:  Head MRI revealed multiple small areas of acute infarct involving the occipital parietal lobe  bilaterally and the left lateral cerebellum,  consistent with posterior circulation emboli.  06/10/2016:  Adomen and pelvic CT revealed progression of peritoneal carcinomatosis predominantly in the perihepatic space, bilateral lower quadrants and pelvis.  There was worsening bilateral obstructive uropathy due to malignant involvement of the pelvic ureters.  There was stable small pleural/subpleural nodules at the right lung base.  There was stable small left pleural effusion.  There was decreased small volume perihepatic ascites.  08/09/2016:  Abdomen and pelvic CT revealed interval increase and loculated fluid collections within the peritoneal space consistent with progression of peritoneal ovarian carcinoma metastasis.  There was one large 7 cm collection in the RIGHT lower quadrant which had increased significantly in size and may represent of point of small bowel obstruction.  The proximal stomach and duodenum were decompressed. The mid small bowel was mildly dilated. There was concern for obstruction given the large intraperitoneal fluid collection.  There was increase in subcapsular hepatic metastatic fluid collections.  There was enlargement of rounded ampullary lesion in the second portion duodenum concerning for metastatic lesion.  There was mild biliary duct dilatation (stable).  There was nodular pleural metastasis in the RIGHT lower lobe pleural space. 11/03/2016:  Abdomen and pelvic CT revealed multifocal cystic metastases in the abdomen/pelvis, reflecting peritoneal disease.  The dominant lesion in the left anterior abdomen has progressed, while additional lesions were mixed.  Peritoneal disease along the liver and spleen progressed.  Pleural-based metastases along the posterior right hemithorax progressed. There were trace left pleural effusion.  There was a stable 1.7 cm duodenal lesion at the ampulla. 11/29/2016:  Abdomen and pelvic CT revealed mild interval enlargement of pleural metastasis the  RIGHT lower lobe.  There was overall mild decrease in cystic peritoneal metastasis within the abdomen pelvis. The solid lesion at the terminal ileum was decreased in size. Subcapsular lesion in the liver was slightly decreased in size.  There was no evidence of disease progression the abdomen pelvis.  There  was mild intrahepatic duct dilatation similar prior.  The ampullary lesion was slightly larger.  There was mild hydronephrosis and hydroureter on the LEFT likely related to cystic metastasis at the LEFT vesicoureteral junction 12/09/2016:  MRCP revealed the cystic lesion within the pancreatic head/uncinate process had decreased in size and demonstrated no suspicious characteristics. This can be presumed a pseudocyst or focus of side branch duct ectasia.  There was similar to slight progression of extensive peritoneal metastasis since 11/29/2016. Grossly similar abdominal nodal and right pleural metastasis.  There was chronic mild common duct dilatation with similar soft tissue fullness about the ampulla compared to 11/29/2016.  There was improved to resolved left-sided hydronephrosis. 01/20/2017:  Abdomen and pelvic CT revealed complex mixed interval changes in widespread metastatic disease in the peritoneal cavity, lower chest and thoracic, abdominal and pelvic lymph nodes. Right lower chest pleural metastasis and several of the peritoneal tumor implants had increased.  There was new right inguinal lymphadenopathy.  Some of the peritoneal tumor implants had decreased.  The small dependent left pleural effusion was slightly increased .  The mild left hydroureteronephrosis was improved.  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   The chronic biliary ductal dilatation was stable .  There was no evidence of bowel obstruction.  Admissions: [facility] from 06/15/2015 - 06/22/2015.  She underwent exploratory laparotomy, lysis of adhesions, total abdominal hysterectomy with bilateral salpingo-oophorectomy, infracolic  omentectomy, optimal tumor debulking(< 1 cm), recto-sigmoid resection with creation of end colostomy, cholecystectomy, mobilization of splenic flexure and liver with diaphragmatic stripping on 06/15/2015. The right diaphragm was cleared of tumor. During dissection, the diaphragm was entered and closed with sutures.  [place] from 07/01/2015 - 07/03/2015 with pleural effusion. [place] from 07/07/2015 - 07/09/2015 with recurrent pleural effusion. [place] from 07/29/2015 - 08/10/2015 with a right-sided empyema and liver abscess. She underwent CT-guided placement of a liver abscess drain on 07/30/2015.  Liver abscess culture grew out group B strep and Enterobacter which was sensitive to Zosyn. She was transitioned to ertapenem Colbert Ewing) prior to discharge.  She was readmitted to [facility] on 09/17/2015. [place] from 05/26/2016 - 05/28/2016 with altered mental status and an acute embolic CVA.  Head MRI on 05/26/2016 revealed multiple small areas of acute infarct involving the occipital parietal lobe bilaterally and the left lateral cerebellum,  consistent with posterior circulation emboli.  Work-up included a negative carotid ultrasound and echocardiogram. [facility] from 08/10/2016 - 08/14/2016 with a small bowel obstruction.  She was managed conservatively. [place] from 09/27/2016 - 09/28/2016 with symptomatic anemia and diarrhea.   Appointment on 05/15/2017  Component Date Value Ref Range Status  . Magnesium 05/15/2017 1.2* 1.7 - 2.4 mg/dL Final   Performed at [facility], [address] [place], [place] [address]  . Sodium 05/15/2017 133* 135 - 145 mmol/L Final  . Potassium 05/15/2017 3.9  3.5 - 5.1 mmol/L Final  . Chloride 05/15/2017 99* 101 - 111 mmol/L Final  . CO2 05/15/2017 26  22 - 32 mmol/L Final  . Glucose, Bld 05/15/2017 342* 65 - 99 mg/dL Final  . BUN 05/15/2017 18  6 - 20 mg/dL Final  . Creatinine, Ser 05/15/2017 0.68  0.44 - 1.00 mg/dL Final  . Calcium 05/15/2017 8.9  8.9 - 10.3 mg/dL Final  . Total  Protein 05/15/2017 6.7  6.5 - 8.1 g/dL Final  . Albumin 05/15/2017 3.2* 3.5 - 5.0 g/dL Final  . AST 05/15/2017 14* 15 - 41 U/L Final  . ALT 05/15/2017 10* 14 - 54 U/L Final  .  Alkaline Phosphatase 05/15/2017 93  38 - 126 U/L Final  . Total Bilirubin 05/15/2017 0.5  0.3 - 1.2 mg/dL Final  . GFR calc non Af Amer 05/15/2017 >60  >60 mL/min Final  . GFR calc Af Amer 05/15/2017 >60  >60 mL/min Final   Comment: (NOTE) The eGFR has been calculated using the CKD EPI equation. This calculation has not been validated in all clinical situations. eGFR's persistently <60 mL/min signify possible Chronic Kidney Disease.   Georgiann Hahn gap 05/15/2017 8  5 - 15 Final   Performed at [facility], [address] [place], [place] [address]  . WBC 05/15/2017 3.0* 3.6 - 11.0 K/uL Final  . RBC 05/15/2017 2.94* 3.80 - 5.20 MIL/uL Final  . Hemoglobin 05/15/2017 8.7* 12.0 - 16.0 g/dL Final  . HCT 05/15/2017 26.4* 35.0 - 47.0 % Final  . MCV 05/15/2017 89.6  80.0 - 100.0 fL Final  . MCH 05/15/2017 29.4  26.0 - 34.0 pg Final  . MCHC 05/15/2017 32.8  32.0 - 36.0 g/dL Final  . RDW 05/15/2017 20.0* 11.5 - 14.5 % Final  . Platelets 05/15/2017 258  150 - 440 K/uL Final  . Neutrophils Relative % 05/15/2017 67  % Final  . Neutro Abs 05/15/2017 2.0  1.4 - 6.5 K/uL Final  . Lymphocytes Relative 05/15/2017 18  % Final  . Lymphs Abs 05/15/2017 0.6* 1.0 - 3.6 K/uL Final  . Monocytes Relative 05/15/2017 14  % Final  . Monocytes Absolute 05/15/2017 0.4  0.2 - 0.9 K/uL Final  . Eosinophils Relative 05/15/2017 0  % Final  . Eosinophils Absolute 05/15/2017 0.0  0 - 0.7 K/uL Final  . Basophils Relative 05/15/2017 1  % Final  . Basophils Absolute 05/15/2017 0.0  0 - 0.1 K/uL Final   Performed at [facility], [address] [place], [place] [address]    Assessment:  SUZIE VANDAM is a 72 y.o. female with progressive stage IV ovarian cancer.  She presented with abdominal discomfort and bloating.  Omental biopsy on  02/23/2015 revealed metastatic high grade serous carcinoma, consistent with gynecologic origin.   She was initially diagnosed with clinical stage IIIC (T3cN1Mx).  CA125 was 707 on 02/17/2015.  She received 4 cycles of neoadjuvant carboplatin and Taxol (03/05/2015 - 05/22/2015).  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  She had loose stools on oral magnesium.  She was initially on Neurontin then switched Lyrica with cycle #3.  Cycle #4 was notable for neutropenia (ANC 300) requiring GCSF x 3 days.    She underwent exploratory laparotomy, lysis of adhesions, total abdominal hysterectomy with bilateral salpingo-oophorectomy, infracolic omentectomy, optimal tumor debulking(< 1 cm), recto-sigmoid resection with creation of end colostomy, cholecystectomy, mobilization of splenic flexure and liver with diaphragmatic stripping on 06/15/2015. The right diaphragm was cleared of tumor. During dissection, the diaphragm was entered and closed with sutures.  She received tamoxifen from 02/25/2016 - 03/14/2016.  She received 4 cycles of carboplatin and gemcitabine (03/21/2016 - 05/24/2016) with GCSF/Neulasta support.  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  Cymbalta began on 02/24/2017  PDL-1 testing revealed a combined score was 5 (>=1 positive).     [facility] genetic testing negative for APC, ATM, BMPR1A, BRCA1, BRCA2, BRIP1, CDH1, CDK4, CDKN2A, CHEK2, EPCAM, MLH1, MSH2, MSH6, MUTYH, NBN, PALB2, PMS2, PTEN, RAD51C, RAD51D, SMAD4, STK11, TP53. Sequencing for select regions of POLE and POLD1, and large rearrangement analysis for select regions of GREM1 were negative.  She received 2  cycles of Doxil (06/21/2016 - 07/19/2016) with Neulasta support.  She received 4 cycles of topotecan with Neulasta support (08/22/2016 - 10/10/2016).    She received 3 cycles of pembrolizumab (Keytruda) from 12/06/2016 -  01/09/2017.  Cycle #1 was complicated by neutropenia (ANC 400) requiring GCSF/Granix x 3  days.  She received GCSF x 5 days with cycle #2.  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 Right lower chest pleural metastasis and several of the peritoneal tumor implants had increased.  There was new right inguinal lymphadenopathy.  Some of the peritoneal tumor implants had decreased.  The small dependent left pleural effusion was slightly increased .  The mild left hydroureteronephrosis was improved.  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   The chronic biliary ductal dilatation was stable .  There was no evidence of bowel obstruction.  CA125 was 802.9 on 03/30/2015, 567.9 on 04/13/2015, 168.8 on 05/15/2015, 85.2 on 07/17/2015, 68.6 on 07/28/2015, 34.5 on 09/17/2015, 20.7 on 11/06/2015, 49 on 01/22/2016, 106.1 on 02/22/2016, 138.8 on 03/07/2016, 220.8 on 03/21/2016, 152.8 on 04/11/2016, 125.6 on 04/18/2016, 76.8 on 05/03/2016, 60.2 on 05/24/2016, 44 on 07/19/2016, 65.8 on 08/17/2016, 53.4 on 09/12/2016, 47.3 on 10/10/2016, 91 on 12/20/2016, 141.1 on 01/09/2017, 463.7 on 02/27/2017, and 907.1 on 03/27/2017.  She has a history of recurrent right sided pleural effusion.  She underwent thoracentesis of 650 cc on post-operative day 3.  She was admitted to [facility] on 07/01/2015 and 07/07/2015 for recurrent shortness of breath.  She underwent 2 additional thoracenteses (1.1 L on 07/02/2015 and 850 cc on 07/08/2015).  Cytology was negative x 2.  Bilateral lower extremity duplex on 07/03/2015 was negative.  Echo revealed an EF of 55-60% on 07/08/2015 and 60-65% on 05/27/2016.    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 She was on Lovenox 60 mg twice a day.  She switched to Eliquis on 04/16/2016 then returned  back to Lovenox after her CVA.  She has severe rheumatoid arthritis.  Methotrexate and Enbrel were initially on hold.  She has a normocytic anemia.  Work-up on 02/17/2015 and 05/24/2016 revealed a normal ferritin, B12, folate, TSH.  She denies any melena or hematochezia.    She has anemia due to chronic disease. She received 1 unit PRBCs during her admission at [facility]. She denies any melena or hematochezia. She has diabetes and is on an insulin pump.  She has had persistent neutropenia felt secondary to her rheumatoid arthritis.  Folate and MMA were normal.  TSH was 6.13 (high) with a free T4 of 1.17 (0.61-1.12).  She began methotrexate (10 mg a week) and prednisone (5 mg a day) for severe rheumatoid arthritis on 12/17/2015.  She remains on prednisone alone (5 mg a day).  Bone marrow aspirate and biopsy on 06/09/2010 revealed a hypercellular marrow (70%) with no evidence of dysplasia or malignancy.  Flow cytometry was negative.  Cytogenetics were normal (46,XX).  FISH studies were negative for MDS.  Bone marrow aspirate and biopsy on 12/03/2015 revealed a normocellular to mildy hypercellular marrow for age (40%) with left shifted myelopoiesis, non specific dyserythropoiesis and mild megakaryocytic atypia with no increase in blasts.  There were multiple small nonspecific lymphoid aggregates (favor reactive). There was no increase in reticulin.  There was  decreased myeloid cells (37%) with left shifted maturation and 1% atypical myelod blasts.  There was relatively increased monocytic cells (11%), relatively increased lymphoid cells (36%), and relatively increased eosinophils (6%).  Cytogenetics were normal (76, XX).  SNP microarray was normal.  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  She receives IV magnesium weekly.  She has chemotherapy induced anemia.  She has received Procrit (last 11/07/2016).  Labs on 08/22/2016 revealed a normal B12 and folate.  Ferritin was 37, iron saturation 11%  and TIBC 281.  Code status is DNR/DNI.  She developed acute LFT elevation on 11/28/2016.  Event was preceded by upper abdominal discomfort.  Hepatitis B core antibody total and hepatitis C antibody were negative.  She is s/p day 8 of cycle #3 Abraxane (03/13/2017 - 05/01/2017).  Cycle #1 was truncated after day 1 secondary to side effects and Thanksgiving.  Symptomatically, she feels "good".  She has a grade II neuropathy is stable and not limiting her functioning.  Exam is stable.  WBC is 3000 with an [place] of 2000. Hemoglobin is 8.7, hematocrit 26.4, and platelets 258,000.  Magnesium is 1.2 (low).   Plan: 1.   Labs today:  CBC with diff, CMP, Mg 2.   Blood counts stable and adequate for treatment. Proceed with day 15 of cycle #3 Abraxane.  3.   Magnesium low at 1.2. Will replace with 6 grams of IV magnesium. Patient prefers pump. RTC tomorrow for pump disconnect.  4.   Follow-up chest, abdomen, and pelvic CT on 05/22/2017. 5.   RTC on 05/18/2017 , 05/22/2017, and 05/25/2017 for labs (BMP, Mg) and +/- Mg. 6.   RTC on 05/29/2017 for MD assessment, labs (CBC with diff, CMP, CA125, Mg), review of scans, and +/- day 1 of cycle #4 Abraxane.   Honor Loh, NP  05/15/2017, 9:05 AM   I saw and evaluated the patient, participating in the key portions of the service and reviewing pertinent diagnostic studies and records.  I reviewed the nurse practitioner's note and agree with the findings and the plan.  The assessment and plan were discussed with the patient.  Additional diagnostic studies of a chest, abdomen, and pelvic CT are needed to restage her disease and would change the clinical management.  Several questions were asked by the patient and answered.   Nolon Stalls, MD 05/15/2017,9:05 AM

## 2017-05-16 ENCOUNTER — Inpatient Hospital Stay: Payer: Medicare Other

## 2017-05-16 ENCOUNTER — Other Ambulatory Visit: Payer: Self-pay | Admitting: *Deleted

## 2017-05-16 DIAGNOSIS — C561 Malignant neoplasm of right ovary: Secondary | ICD-10-CM | POA: Diagnosis not present

## 2017-05-16 DIAGNOSIS — C800 Disseminated malignant neoplasm, unspecified: Principal | ICD-10-CM

## 2017-05-16 MED ORDER — PEGFILGRASTIM INJECTION 6 MG/0.6ML ~~LOC~~
6.0000 mg | PREFILLED_SYRINGE | Freq: Once | SUBCUTANEOUS | Status: AC
  Administered 2017-05-16: 6 mg via SUBCUTANEOUS

## 2017-05-16 MED ORDER — SODIUM CHLORIDE 0.9% FLUSH
10.0000 mL | INTRAVENOUS | Status: DC | PRN
  Administered 2017-05-16: 10 mL via INTRAVENOUS
  Filled 2017-05-16: qty 10

## 2017-05-16 MED ORDER — HEPARIN SOD (PORK) LOCK FLUSH 100 UNIT/ML IV SOLN
500.0000 [IU] | Freq: Once | INTRAVENOUS | Status: AC
  Administered 2017-05-16: 500 [IU] via INTRAVENOUS
  Filled 2017-05-16: qty 5

## 2017-05-16 NOTE — Telephone Encounter (Signed)
Pt needs f/u appt with Goll[facility]. Thank you

## 2017-05-17 ENCOUNTER — Ambulatory Visit: Payer: Medicare Other

## 2017-05-18 ENCOUNTER — Inpatient Hospital Stay: Payer: Medicare Other

## 2017-05-18 DIAGNOSIS — C561 Malignant neoplasm of right ovary: Secondary | ICD-10-CM | POA: Diagnosis not present

## 2017-05-18 DIAGNOSIS — C800 Disseminated malignant neoplasm, unspecified: Principal | ICD-10-CM

## 2017-05-18 LAB — BASIC METABOLIC PANEL
Anion gap: 9 (ref 5–15)
BUN: 18 mg/dL (ref 6–20)
CO2: 26 mmol/L (ref 22–32)
Calcium: 8.9 mg/dL (ref 8.9–10.3)
Chloride: 98 mmol/L — ABNORMAL LOW (ref 101–111)
Creatinine, Ser: 0.85 mg/dL (ref 0.44–1.00)
GFR calc Af Amer: >60 mL/min (ref >60)
GFR calc non Af Amer: >60 mL/min (ref >60)
Glucose, Bld: 194 mg/dL — ABNORMAL HIGH (ref 65–99)
Potassium: 3.9 mmol/L (ref 3.5–5.1)
Sodium: 133 mmol/L — ABNORMAL LOW (ref 135–145)

## 2017-05-18 LAB — MAGNESIUM: Magnesium: 1.4 mg/dL — ABNORMAL LOW (ref 1.7–2.4)

## 2017-05-18 MED ORDER — SODIUM CHLORIDE 0.9 % IV SOLN
6.0000 g | Freq: Once | INTRAVENOUS | Status: DC
  Administered 2017-05-18: 6 g via INTRAVENOUS
  Filled 2017-05-18: qty 10

## 2017-05-19 ENCOUNTER — Inpatient Hospital Stay: Payer: Medicare Other

## 2017-05-19 DIAGNOSIS — C801 Malignant (primary) neoplasm, unspecified: Secondary | ICD-10-CM

## 2017-05-19 DIAGNOSIS — C561 Malignant neoplasm of right ovary: Secondary | ICD-10-CM | POA: Diagnosis not present

## 2017-05-19 MED ORDER — SODIUM CHLORIDE 0.9% FLUSH
10.0000 mL | INTRAVENOUS | Status: DC | PRN
  Administered 2017-05-19: 10 mL via INTRAVENOUS
  Filled 2017-05-19: qty 10

## 2017-05-19 MED ORDER — HEPARIN SOD (PORK) LOCK FLUSH 100 UNIT/ML IV SOLN
500.0000 [IU] | Freq: Once | INTRAVENOUS | Status: AC
  Administered 2017-05-19: 500 [IU] via INTRAVENOUS

## 2017-05-19 MED ORDER — HEPARIN SOD (PORK) LOCK FLUSH 100 UNIT/ML IV SOLN
INTRAVENOUS | Status: AC
  Filled 2017-05-19: qty 5

## 2017-05-21 ENCOUNTER — Encounter: Payer: Self-pay | Admitting: Hematology and Oncology

## 2017-05-22 ENCOUNTER — Inpatient Hospital Stay: Payer: Medicare Other

## 2017-05-22 ENCOUNTER — Ambulatory Visit
Admission: RE | Admit: 2017-05-22 | Discharge: 2017-05-22 | Disposition: A | Discharge status: Home / Self Care | Payer: Medicare Other | Source: Ambulatory Visit | Attending: Urgent Care | Admitting: Urgent Care

## 2017-05-22 VITALS — BP 139/79 | HR 93 | Temp 96.6°F | Resp 18

## 2017-05-22 DIAGNOSIS — N134 Hydroureter: Secondary | ICD-10-CM | POA: Insufficient documentation

## 2017-05-22 DIAGNOSIS — C801 Malignant (primary) neoplasm, unspecified: Secondary | ICD-10-CM | POA: Diagnosis not present

## 2017-05-22 DIAGNOSIS — I2510 Atherosclerotic heart disease of native coronary artery without angina pectoris: Secondary | ICD-10-CM | POA: Diagnosis not present

## 2017-05-22 DIAGNOSIS — S2241XA Multiple fractures of ribs, right side, initial encounter for closed fracture: Secondary | ICD-10-CM | POA: Insufficient documentation

## 2017-05-22 DIAGNOSIS — C787 Secondary malignant neoplasm of liver and intrahepatic bile duct: Secondary | ICD-10-CM | POA: Insufficient documentation

## 2017-05-22 DIAGNOSIS — C786 Secondary malignant neoplasm of retroperitoneum and peritoneum: Secondary | ICD-10-CM | POA: Insufficient documentation

## 2017-05-22 DIAGNOSIS — J90 Pleural effusion, not elsewhere classified: Secondary | ICD-10-CM | POA: Insufficient documentation

## 2017-05-22 DIAGNOSIS — C561 Malignant neoplasm of right ovary: Secondary | ICD-10-CM

## 2017-05-22 DIAGNOSIS — M47896 Other spondylosis, lumbar region: Secondary | ICD-10-CM | POA: Diagnosis not present

## 2017-05-22 DIAGNOSIS — N131 Hydronephrosis with ureteral stricture, not elsewhere classified: Secondary | ICD-10-CM | POA: Insufficient documentation

## 2017-05-22 DIAGNOSIS — C800 Disseminated malignant neoplasm, unspecified: Secondary | ICD-10-CM | POA: Insufficient documentation

## 2017-05-22 DIAGNOSIS — M5136 Other intervertebral disc degeneration, lumbar region: Secondary | ICD-10-CM | POA: Diagnosis not present

## 2017-05-22 DIAGNOSIS — I700 Atherosclerosis of aorta: Secondary | ICD-10-CM | POA: Insufficient documentation

## 2017-05-22 DIAGNOSIS — M899 Disorder of bone, unspecified: Secondary | ICD-10-CM | POA: Diagnosis not present

## 2017-05-22 DIAGNOSIS — X58XXXA Exposure to other specified factors, initial encounter: Secondary | ICD-10-CM | POA: Diagnosis not present

## 2017-05-22 DIAGNOSIS — I059 Rheumatic mitral valve disease, unspecified: Secondary | ICD-10-CM | POA: Insufficient documentation

## 2017-05-22 LAB — CBC WITH DIFFERENTIAL/PLATELET
Basophils Absolute: 0.2 10*3/uL — ABNORMAL HIGH (ref 0–0.1)
Basophils Relative: 1 %
Eosinophils Absolute: 0 10*3/uL (ref 0–0.7)
Eosinophils Relative: 0 %
HCT: 26.5 % — ABNORMAL LOW (ref 35.0–47.0)
Hemoglobin: 8.6 g/dL — ABNORMAL LOW (ref 12.0–16.0)
Lymphocytes Relative: 7 %
Lymphs Abs: 1.5 10*3/uL (ref 1.0–3.6)
MCH: 29 pg (ref 26.0–34.0)
MCHC: 32.2 g/dL (ref 32.0–36.0)
MCV: 89.9 fL (ref 80.0–100.0)
Monocytes Absolute: 1.7 10*3/uL — ABNORMAL HIGH (ref 0.2–0.9)
Monocytes Relative: 8 %
Neutro Abs: 17.3 10*3/uL — ABNORMAL HIGH (ref 1.4–6.5)
Neutrophils Relative %: 84 %
Platelets: 315 10*3/uL (ref 150–440)
RBC: 2.95 MIL/uL — ABNORMAL LOW (ref 3.80–5.20)
RDW: 20.5 % — ABNORMAL HIGH (ref 11.5–14.5)
WBC: 20.7 10*3/uL — ABNORMAL HIGH (ref 3.6–11.0)

## 2017-05-22 LAB — COMPREHENSIVE METABOLIC PANEL
ALT: 10 U/L — ABNORMAL LOW (ref 14–54)
AST: 17 U/L (ref 15–41)
Albumin: 3.4 g/dL — ABNORMAL LOW (ref 3.5–5.0)
Alkaline Phosphatase: 142 U/L — ABNORMAL HIGH (ref 38–126)
Anion gap: 9 (ref 5–15)
BUN: 15 mg/dL (ref 6–20)
CO2: 27 mmol/L (ref 22–32)
Calcium: 9.3 mg/dL (ref 8.9–10.3)
Chloride: 98 mmol/L — ABNORMAL LOW (ref 101–111)
Creatinine, Ser: 0.77 mg/dL (ref 0.44–1.00)
GFR calc Af Amer: >60 mL/min (ref >60)
GFR calc non Af Amer: >60 mL/min (ref >60)
Glucose, Bld: 269 mg/dL — ABNORMAL HIGH (ref 65–99)
Potassium: 4 mmol/L (ref 3.5–5.1)
Sodium: 134 mmol/L — ABNORMAL LOW (ref 135–145)
Total Bilirubin: 0.5 mg/dL (ref 0.3–1.2)
Total Protein: 6.8 g/dL (ref 6.5–8.1)

## 2017-05-22 LAB — MAGNESIUM: Magnesium: 1.3 mg/dL — ABNORMAL LOW (ref 1.7–2.4)

## 2017-05-22 MED ORDER — SODIUM CHLORIDE 0.9 % IV SOLN
6.0000 g | Freq: Once | INTRAVENOUS | Status: DC
  Administered 2017-05-22: 6 g via INTRAVENOUS
  Filled 2017-05-22: qty 10

## 2017-05-22 MED ORDER — IOPAMIDOL (ISOVUE-300) INJECTION 61%
100.0000 mL | Freq: Once | INTRAVENOUS | Status: AC | PRN
  Administered 2017-05-22: 100 mL via INTRAVENOUS

## 2017-05-22 MED ORDER — SODIUM CHLORIDE 0.9% FLUSH
10.0000 mL | Freq: Once | INTRAVENOUS | Status: AC
  Administered 2017-05-22: 10 mL via INTRAVENOUS
  Filled 2017-05-22: qty 10

## 2017-05-22 MED ORDER — HEPARIN SOD (PORK) LOCK FLUSH 100 UNIT/ML IV SOLN: 500.0000 [IU] | Freq: Once | INTRAVENOUS | Status: DC

## 2017-05-22 MED ORDER — SODIUM CHLORIDE 0.9% FLUSH
10.0000 mL | Freq: Once | INTRAVENOUS | Status: DC
  Filled 2017-05-22: qty 10

## 2017-05-22 NOTE — Addendum Note (Signed)
Addended by: Meribeth Mattes H on: 05/22/2017 10:46 AM   Modules accepted: Orders

## 2017-05-23 ENCOUNTER — Inpatient Hospital Stay: Payer: Medicare Other

## 2017-05-23 VITALS — BP 138/78 | HR 90 | Temp 98.0°F | Resp 18

## 2017-05-23 DIAGNOSIS — C561 Malignant neoplasm of right ovary: Secondary | ICD-10-CM | POA: Diagnosis not present

## 2017-05-23 DIAGNOSIS — C801 Malignant (primary) neoplasm, unspecified: Secondary | ICD-10-CM

## 2017-05-23 LAB — CA 125: Cancer Antigen (CA) 125: 192.1 U/mL — ABNORMAL HIGH (ref 0.0–38.1)

## 2017-05-23 MED ORDER — HEPARIN SOD (PORK) LOCK FLUSH 100 UNIT/ML IV SOLN
500.0000 [IU] | Freq: Once | INTRAVENOUS | Status: AC
  Administered 2017-05-23: 500 [IU] via INTRAVENOUS
  Filled 2017-05-23: qty 5

## 2017-05-23 MED ORDER — SODIUM CHLORIDE 0.9% FLUSH
10.0000 mL | INTRAVENOUS | Status: DC | PRN
  Administered 2017-05-23: 10 mL via INTRAVENOUS
  Filled 2017-05-23: qty 10

## 2017-05-24 ENCOUNTER — Other Ambulatory Visit: Payer: Self-pay

## 2017-05-24 ENCOUNTER — Encounter: Payer: Self-pay | Admitting: Obstetrics and Gynecology

## 2017-05-24 ENCOUNTER — Inpatient Hospital Stay (HOSPITAL_BASED_OUTPATIENT_CLINIC_OR_DEPARTMENT_OTHER): Payer: Medicare Other | Admitting: Obstetrics and Gynecology

## 2017-05-24 ENCOUNTER — Inpatient Hospital Stay: Payer: Medicare Other

## 2017-05-24 VITALS — BP 156/73 | HR 87 | Temp 97.9°F | Resp 18 | Ht 63.0 in | Wt 130.2 lb

## 2017-05-24 DIAGNOSIS — I509 Heart failure, unspecified: Secondary | ICD-10-CM

## 2017-05-24 DIAGNOSIS — Z90710 Acquired absence of both cervix and uterus: Secondary | ICD-10-CM

## 2017-05-24 DIAGNOSIS — I110 Hypertensive heart disease with heart failure: Secondary | ICD-10-CM | POA: Diagnosis not present

## 2017-05-24 DIAGNOSIS — C561 Malignant neoplasm of right ovary: Secondary | ICD-10-CM | POA: Diagnosis not present

## 2017-05-24 DIAGNOSIS — I2510 Atherosclerotic heart disease of native coronary artery without angina pectoris: Secondary | ICD-10-CM | POA: Diagnosis not present

## 2017-05-24 DIAGNOSIS — C569 Malignant neoplasm of unspecified ovary: Secondary | ICD-10-CM | POA: Diagnosis not present

## 2017-05-24 DIAGNOSIS — Z95828 Presence of other vascular implants and grafts: Secondary | ICD-10-CM | POA: Insufficient documentation

## 2017-05-24 DIAGNOSIS — I252 Old myocardial infarction: Secondary | ICD-10-CM | POA: Diagnosis not present

## 2017-05-24 DIAGNOSIS — Z87891 Personal history of nicotine dependence: Secondary | ICD-10-CM

## 2017-05-24 DIAGNOSIS — M069 Rheumatoid arthritis, unspecified: Secondary | ICD-10-CM

## 2017-05-24 DIAGNOSIS — C7919 Secondary malignant neoplasm of other urinary organs: Secondary | ICD-10-CM | POA: Diagnosis not present

## 2017-05-24 DIAGNOSIS — Z7982 Long term (current) use of aspirin: Secondary | ICD-10-CM

## 2017-05-24 DIAGNOSIS — E109 Type 1 diabetes mellitus without complications: Secondary | ICD-10-CM

## 2017-05-24 DIAGNOSIS — C786 Secondary malignant neoplasm of retroperitoneum and peritoneum: Secondary | ICD-10-CM

## 2017-05-24 DIAGNOSIS — Z90722 Acquired absence of ovaries, bilateral: Secondary | ICD-10-CM | POA: Diagnosis not present

## 2017-05-24 DIAGNOSIS — Z79899 Other long term (current) drug therapy: Secondary | ICD-10-CM

## 2017-05-24 DIAGNOSIS — Z794 Long term (current) use of insulin: Secondary | ICD-10-CM

## 2017-05-24 DIAGNOSIS — E785 Hyperlipidemia, unspecified: Secondary | ICD-10-CM

## 2017-05-24 MED ORDER — HEPARIN SOD (PORK) LOCK FLUSH 100 UNIT/ML IV SOLN
500.0000 [IU] | INTRAVENOUS | Status: AC | PRN
  Administered 2017-05-24: 500 [IU]

## 2017-05-24 MED ORDER — METOPROLOL TARTRATE 25 MG PO TABS: 12.5000 mg | ORAL_TABLET | Freq: Two times a day (BID) | ORAL | 0 refills | Status: DC

## 2017-05-24 MED ORDER — SODIUM CHLORIDE 0.9% FLUSH
10.0000 mL | INTRAVENOUS | Status: AC | PRN
  Administered 2017-05-24: 10 mL
  Filled 2017-05-24: qty 10

## 2017-05-24 NOTE — Progress Notes (Signed)
error 

## 2017-05-24 NOTE — Progress Notes (Signed)
Chaperoned pelvic exam. Oncology Nurse Navigator Documentation  Navigator Location: [facility] (05/24/17 1200)   )Navigator Encounter Type: Follow-up Appt (05/24/17 1200)                     Patient Visit Type: GynOnc (05/24/17 1200)                              Time Spent with Patient: 15 (05/24/17 1200)

## 2017-05-24 NOTE — Progress Notes (Signed)
Pt states that she has noticed that while she is on mag pump to go home she feels more bloating in lower part of abd.. But upper abdomen right under breasts feel tightness feeling and worse when she lies flat. She also has tenderness at kidney area on left side.

## 2017-05-24 NOTE — Progress Notes (Signed)
Gynecologic Oncology Interval Visit   Referring Provider: Lequita Asal, MD  Additional Care Provider: Dr. Harvest [name].  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    Subjective:  [name] a 72 y.o. 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  [name] saw her on 05/15/2017 and scheduled for cycle #4 planned for 05/29/2017. Neulasta given on 05/16/2017.  Marland Kitchen   Ref Range & Units 2d ago 37moago 242mogo  Cancer Antigen (CA) 125 0.0 - 38.1 U/mL 192.1 Abnormally high   907.1 Abnormally high  CM 463.7 Abnormally high  CM     05/22/2017 CT scan C/A/P  IMPRESSION: 1. Significant enlargement of metastatic disease involving the right pleura, low paratracheal nodal chain, omentum, peritoneal surfaces, mesentery, and liver. Tumors extending through the ostomy site into the subcutaneous tissues. Multiple new metastatic liver and right pleural lesions. 2. A left pelvic mass [name] implicated in obstruction of the left ureter, with prominent left hydronephrosis and hydroureter, but only subtle delay in excretion resulting. 3. New sclerotic lesion in the T11 vertebral body, concerning for possible metastatic disease. 4. Healing anterior nondisplaced rib fractures of the right second through ninth ribs, these appear to be new compared to 01/20/2017. 5. Aortic Atherosclerosis (ICD10-I70.0). Coronary atherosclerosis. Calcified mitral valve. Lumbar spondylosis and degenerative disc disease. Small left pleural effusion.   She presents for a pelvic exam today.   Gynecologic Oncology History: [facility] with a 2-3 week history of diffuse abdominal discomfort and bloating.    Abdomen and pelvic CT scan on 02/13/2015 revealed bilateral mass-like adnexal regions. The right adnexal region measured approximately 5.6 x 5.0 cm posterior to the uterus, and the left adnexa demonstrated a large mass measuring approximately 10.3 x 6.0 x 9.9 cm, with heterogeneous internal areas of enhancement and cystic change. This appeared intimately associated with the adjacent proximal sigmoid colon. There was a large amount of soft tissue throughout the peritoneal cavity involving the omentum and other peritoneal surfaces, compatible with widespread intraperitoneal metastatic disease. There was a small volume of presumably malignant ascites. There was a peripheral low-attenuation lesion overlying the right lobe of the liver measuring approximately 3.3 x 1.9 cm, favored to represent a serosal implant. There was a similar appearing 1.4 x 2.2 cm ill-defined peripheral lesion within the inferior aspect of segment 6 adjacent to the ampulla in the duodenum. There was no lytic or blastic lesions. She had a chronic T12 compression fracture.  She was seen in consultation from Dr. CoMike [name] bilateral ovarian masses with associated peritoneal metastatic disease.   02/23/2015 CT biopsy high grade serous carcinoma.  Decision was made for neoadjuvant chemotherapy with carboplatin/taxol. She has had 4 cycles of carboplatin (AUC 5) /taxol (175 mg per sq M) with Dr [name] 12/19.  CA125 dropped from 707 in 10/16 to 168.8 in 05/15/2015.   She stopped MTX and Enbarel she had been taking for rheumatoid arthritis while on chemotherapy and joint symptoms have been in good control.  Uses Prednisone 25m74md prn and has taken this the last three days.   CT scan 04/27/16 Heterogeneous left adnexal mass measures approximately 4.0 x 6.1 cm, previously approximately 5.0  x 8.5 cm when remeasured.  Heterogeneous right ovary measures approximately 3.8 x 4.9 cm, roughly 4.7 x 5.6 cm on the prior exam. Uterus [name]. Other: Omental caking has improved in the interval but has not fully resolved. Residual scattered areas of omental nodularity and thickening persist. Small ascites, decreased.   Hepatobiliary: Low-attenuation lesion in the periphery of the hepatic dome measures 3.1 cm, stable. A lesion previously seen off the posterior right hepatic lobe [name] not readily visualized. [name]. Numerous stones are seen in the gallbladder. No biliary ductal dilatation.  IMPRESSION: 1. Interval response to therapy as evidenced by decrease in size of bilateral ovarian masses and improved, but not resolved, peritoneal carcinomatosis. 2. Hepatic dome lesion [name] unchanged. Previously seen right hepatic lobe lesion [name] not well-visualized. 3. Small ascites and small left pleural effusion. Trace right pleural effusion. 4. Cholelithiasis. 5. Nodular lesion at the ampulla of Vater, extending into the duodenum, stable.  On 06/15/2015 she underwent diagnostic laparoscopy, exploratory laparotomy, lysis of adhesions (including enterolysis for > 45 minutes), total abdominal hysterectomy with bilateral salpingo-oophorectomy, infracolic omentectomy, optimal tumor debulking, recto-sigmoid resection with creation of end colostomy, mobilization of the splenic flexure  mobilization of the liver with diaphragmatic stripping and resection of diaphragm due to disease, repair of diaphragmatic defect and cholecystectomy. Her initial postoperative course was unremarkable.   PATHOLOGY: DIAGNOSIS  A. Uterus, bilateral ovaries and fallopian tubes, hysterectomy and bilateral salpingo-oophorectomy:  Left ovary: Residual high grade serous carcinoma, 2.5 cm. Right ovary: Residual high grade serous carcinoma, 1.5 cm.  Right and left fallopian tubes, free of tumor. See  comment.  Uterus:  Residual high grade serous carcinoma involving uterine serosa and myometrium. Endometrium: Atrophic endometrium Cervix: No pathologic diagnosis.   See synoptic report for additional details.  Comment: Immunostains performed on block A17 ( right tube) show a small segment of p53 positive tubal epithelium. However, this focus of epithelium [name] cytological unremarkable and shows no increase in Ki-67 labelling or P16 staining. It [name] therefore classified as a p53 signature lesion rather than a serous tubal intraepithelial carcinoma.    B. Rectomsigmoid colon and partial left ovary, resection:   Residual high grade serous carcinoma involving colonic serosa and subserosa, fallopian tube and ovary.   C. Omentum, omentectomy:   Residual high grade serous carcinoma, tumor size > 2 cm.   D. Perihepatic tumor, excision:  Residual high grade serous carcinoma.   E. Gallbladder, cholecystectomy:  Chronic cholecystitis and cholelithiasis. Negative for malignancy.   F. Diaphragm, excision:  Residual high grade serous carcinoma, > 2 cm  G. Omentum #2, excision:   Residual high grade serous carcinoma.     She developed dyspnea and was admitted twice to [facility]. She was admitted to [facility] on 07/01/2015 and 07/07/2015 for recurrent shortness of breath.  She has undergone 2 additional thoracentesis (1.1 L on 07/02/2015 and 850 cc on 07/08/2015).  Cytology was negative x 2.  Bilateral lower extremity duplex on 07/03/2015 was negative.  Echo on 07/08/2015 revealed en EF of 55-60%.    She was admitted to [facility] from 07/29/2015 - 08/10/2015 with a right-sided empyema and perihepatic abscess. She underwent CT-guided placement of a liver abscess drain on 07/30/2015. Liver abscess culture grew out group B strep and Enterobacter which was sensitive to Zosyn. She was transitioned to ertapenem Colbert Ewing) prior to discharge.  She was readmitted to [facility] on  09/17/2015.  Chest, abdomen, and pelvic CT scan on 09/01/2015 revealed  continued decrease in perihepatic fluid collection contiguous with the right pleural space with percutaneous drain. Drain was removed on 09/01/2015.  Chest, abdomen, and pelvic CT scan on 09/11/2015 revealed a residual versus recurrent fluid collection posterior to the right hepatic lobe (2.6 x 1.1 x 4.0 cm).  Chest, abdomen, and pelvic CT scan on 10/13/2015 revealed resolution of the empyema.    Chemotherapy was held due to a combination of multiple comorbidities and RA associated neutropenia.  We recommended proceeding with chemotherapy if she had a symptomatic relapse or if her CT scan shows considerable disease.  02/15/2016:CT scan revealed multiple soft tissue nodules throughout the pelvis, small bowel mesenteric and peritoneum and, concerning for peritoneal metastatic disease.  There was soft tissue irregularity within the left upper quadrant which may represent postprocedural changes or metastatic disease; mild right hydronephrosis (etiology unclear); a pleural based nodule within the right lower hemi-thorax concerning for the possibility of pleural based metastasis; small bilateral pleural effusions; pulmonary nodularity, predominately within the left upper lobe (metastatic or infectious/inflammatory etiology); slightly increased mediastinal adenopathy (infectious/inflammatory or metastatic); interval development of a low attenuation lesion within the left hepatic lobe (complicated fluid within the fissure or metastatic disease).  She was restarted on chemotherapy with carboplatin and gemcitabine.  She just saw [name] prior to day 8 of cycle #3 carboplatin and gemcitabine.  05/26/2016 Imaging included MR brain that revealed multiple small areas of acute infarct involving the occipital parietal lobe bilaterally and the left lateral cerebellum, consistent with posterior circulation emboli.  Atrophy and mild chronic  microvascular ischemia. US carotid: Less than 50% stenosis in the right internal carotid artery.  50-69% stenosis in the left internal carotid artery. CT angiography: Right proximal ICA mild 40% stenosis due to calcified plaque, Left proximal ICA moderate 60% stenosis due to calcified plaque, Bilateral vertebral artery V3/V4 junction calcified plaque with moderate stenosis, Bilateral ICA cavernous and paraclinoid severe calcific atherosclerosis with mild-to-moderate paraclinoid stenosis, Otherwise no significant stenosis, proximal occlusion, aneurysm, or vascular malformation of circle of Willis; Mild aortic atherosclerosis; 15 mm left thyroid lobe nodule, this can be further characterized with thyroid ultrasound on a nonemergent basis; Left upper lobe pulmonary nodules measuring up to 4 mm.   06/10/2016 CT abdomen and pelvis: Progression of peritoneal carcinomatosis; worsening bilateral obstructive uropathy due to malignant involvement of the pelvic ureters; stable small pleural/subpleural nodules right and small left pleural effusion; additional findings include aortic atherosclerosis, coronary atherosclerosis, stable nonspecific ampullary mass and small hiatal hernia.  Her chemotherapy was altered to Doxil and she was responding based on CA125 values. 60.2 to 44. But, she progressed after 2 cycles of Doxil (06/21/2016 - 07/19/2016).    08/09/2016 She was seen in [facility] ER with concerns for SBO and transferred to [facility] on 08/10/2016. She had a transition point in the RLQ associated with a fluid filled cystic lesion.   08/09/2016 CT scan A/P: Interval increase and loculated fluid collections within the peritoneal space consistent with progression of peritoneal ovarian carcinoma metastasis. One large 7 cm collection in the RIGHT lower quadrant [name] increased significantly in size and may represent of point of small bowel obstruction; Increase in subcapsular hepatic metastatic fluid collections; Enlargement of  rounded ampullary lesion in the second portion duodenum [name] concerning for metastatic lesion; nodular pleural metastasis in the RIGHT lower lobe pleural space. Lesions increased.  She was managed conservatively and discharged home on 08/14/2016. During that hospitalization we had a goals of care conversation. She [name] DNAR and has advanced  care directives in place. Her husband [name] her HCPOA. She [name] interested in more therapy.  She was initiated on single agent topotecan (starting 08/22/2016 - 10/10/2016) and received 4 cycles of therapy. She required Neulasta support.    Abdomen and pelvic CT scan on 11/03/2016 revealed multifocal cystic metastases in the abdomen/pelvis, reflecting peritoneal disease.  The dominant lesion in the left anterior abdomen has progressed, while additional lesions were mixed.  Peritoneal disease along the liver and spleen progressed.  Pleural-based metastases along the posterior right hemithorax progressed. There were trace left pleural effusion.  There was a stable 1.7 cm duodenal lesion at the ampulla.    PDL-1 testing was positive and she received 3 cycles of pembrolizumab (Keytruda) from 12/06/2016 -  01/09/2017.  Cycle #1 was complicated by neutropenia (ANC 400) requiring GCSF/Granix x 3 days.  She received GCSF x 5 days with cycle #2 and cycle #3 (9/102018) but concern for progression given rise in CA125 and mixed response vs progressive disease on CT scan.   Abdomen and pelvic CT scan on 01/20/2017 revealed complex mixed interval changes in widespread metastatic disease in the peritoneal cavity, lower chest and thoracic, abdominal and pelvic lymph nodes. Right lower chest pleural metastasis and several of the peritoneal tumor implants had increased.  There was new right inguinal lymphadenopathy.  Some of the peritoneal tumor implants had decreased.  The small dependent left pleural effusion was slightly increased .  The mild left hydroureteronephrosis was improved.  The  ampullary mass was mildly decreased.   The chronic biliary ductal dilatation was stable .  There was no evidence of bowel obstruction.  She was started on Abraxane   CA125 was 802.9 on 03/30/2015, 567.9 on 04/13/2015, 168.8 on 05/15/2015, 85.2 on 07/17/2015, 68.6 on 07/28/2015, 34.5 on 09/17/2015, 20.7 on 11/06/2015, 49 on 01/22/2016, 106.1 on 02/22/2016, 138.8 on 03/07/2016, 220.8 on 03/21/2016, 152.8 on 04/11/2016, 125.6 on 04/18/2016, 76.8 on 05/03/2016, 60.2 on 05/24/2016, 44 on 07/19/2016, 65.8 on 08/17/2016, 53.4 on 09/12/2016, 47.3 on 10/10/2016, 91 on 12/20/2016, 141.1 on 01/09/2017, 463.7 on 02/27/2017, and 907.1 on 03/27/2017  Genetic testing: MyRisk testing negative for APC, ATM, BMPR1A, BRCA1, BRCA2, BRIP1, CDH1, CDK4, CDKN2A, CHEK2, EPCAM, MLH1, MSH2, MSH6, MUTYH, NBN, PALB2, PMS2, PTEN, RAD51C, RAD51D, SMAD4, STK11, TP53. Sequencing for select regions of POLE and POLD1, and large rearrangement analysis for select regions of GREM1 were negative.  PDL-1 testing revealed a combined score was 5 (>=1 positive).    Of note she has severe rheumatoid arthritis with persistent neutropenia; diabetes and [name] on an insulin pump; near occlusive thrombus within the central portion of the right internal jugular vein and central portion of the right subclavian vein on Lovenox; and persistent grade III neuropathy.       Problem List: Patient Active Problem List   Diagnosis Date Noted  . Port-A-Cath in place 05/24/2017  . PAC (premature atrial contraction) 05/12/2017  . Chemotherapy-induced neuropathy ([place]) 02/17/2017  . Diabetic sensorimotor neuropathy ([place]) 02/17/2017  . Encounter for antineoplastic immunotherapy 12/26/2016  . Neutropenia ([place]) 12/14/2016  . Elevated liver function tests 11/28/2016  . Anemia due to antineoplastic chemotherapy 11/13/2016  . Pancytopenia ([place]) 09/27/2016  . Thrombocytopenia ([place]) 09/04/2016  . Acute embolic stroke ([place]) 99/24/2683  . Hx of CABG 04/12/2016   . Encounter for antineoplastic chemotherapy 03/31/2016  . Heel ulcer ([place]) 12/09/2015  . Bile duct abnormality 12/09/2015  . Diabetic ulcer of left heel associated with type 1 diabetes mellitus ([place]) 12/07/2015  . Alkaline phosphatase  elevation 11/17/2015  . Leukopenia 11/06/2015  . Weight loss 09/17/2015  . Thrombosis of right internal jugular vein ([place]) 09/03/2015  . Hypokalemia 08/07/2015  . Malnutrition ([place]) 07/30/2015  . Fatigue 07/17/2015  . Recurrent pleural effusion on right 07/01/2015  . Hypoalbuminemia due to protein-calorie malnutrition ([place]) 06/20/2015  . Severe protein-calorie malnutrition ([place]) 06/19/2015  . Primary malignant neoplasm of right ovary with widespread metastatic disease ([place]) 05/28/2015  . Decubitus ulcer 05/19/2015  . Hyponatremia 05/15/2015  . Malignant neoplasm of ovary ([place]) 05/06/2015  . Bilateral lower extremity edema 03/30/2015  . Hypomagnesemia 03/05/2015  . Peritoneal carcinomatosis ([place]) 02/17/2015  . Adnexal mass 02/13/2015  . History of esophageal stricture 07/08/2014  . Dysphagia, pharyngoesophageal phase 07/08/2014  . Cataracts, bilateral 01/13/2014  . CAD (coronary artery disease) of artery bypass graft 11/05/2012  . Anemia, iron deficiency 10/09/2012  . CAD (coronary artery disease) 10/01/2012  . Hearing loss 07/06/2011  . Diabetes type 1, controlled ([place]) 07/06/2011  . GERD (gastroesophageal reflux disease) 04/05/2011  . Osteoporosis, unspecified 04/05/2011  . Hypertension 01/10/2011  . Hyperlipidemia 01/10/2011  . RA (rheumatoid arthritis) ([place]) 01/10/2011  . Vitamin D deficiency 12/03/2008  . 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 CAD (coronary artery disease)    a. 09/2012 Cath: LM nl, LAD 95p, 23m LCX 911mOM2 50, RCA 100.  . Marland KitchenHF (congestive heart failure) ([facility]  . Cholelithiasis   . Collagen vascular disease (HCC)    Rheumatoid Arthritis.  . Esophageal  stricture   . Exertional shortness of breath   . GERD (gastroesophageal reflux disease)   . H/O hiatal hernia   . Herniated disc   . History of pancytopenia   . Hyperlipidemia   . Hypertension   . Hypokalemia   . Leukopenia 2012   s/p bone marrow biopsy, Dr. PaMa Hillock. NSTEMI (non-ST elevated myocardial infarction) ([facility] [place]/2014   "mild" (10/18/2012)  . Ovarian cancer ([facility]2016   chemo and hysterectomy  . Pneumonia 2013; 08/2012   "one lung; double" (10/18/2012)  . Rheumatoid arthritis(714.0)   . Type I diabetes mellitus ([facility] [place]   "dx'd in 1957" (10/18/2012)    Past Surgical History: Past Surgical History:  Procedure Laterality Date  . ABDOMINAL HYSTERECTOMY  06/15/2015   Dx L/S, EXLAP TAH BSO omentectomy RSRx colostomy diaphragm resection stripping  . CARDIAC CATHETERIZATION  10/18/2012   "first one was today" (10/18/2012)  . CATARACT EXTRACTION W/ INTRAOCULAR LENS IMPLANT Right 2010  . CHOLECYSTECTOMY  06/15/2015   combined case with ovarian cancer debulking  . COLON SURGERY    . CORONARY ARTERY BYPASS GRAFT N/A 10/19/2012   Procedure: CORONARY ARTERY BYPASS GRAFTING (CABG);  Surgeon: StMelrose NakayamaMD;  [facility] [place] Service: Open Heart Surgery;  Laterality: N/A;  . ESOPHAGEAL DILATION     "3 or 4 times" (10/19/2011)  . ESOPHAGOGASTRODUODENOSCOPY  2012   Dr. IfMinna Merritts. OSTOMY    . OVARY SURGERY     removal  . PERIPHERAL VASCULAR CATHETERIZATION N/A 03/02/2015   Procedure: PoGlori Luisath Insertion;  Surgeon: JaAlgernon HuxleyMD;  [facility];  Service: Cardiovascular;  Laterality: N/A;  . TUBAL LIGATION  1970    Past Gynecologic History:  Postmenopausal She has not had a GYN evaluation since age 72   OB History:  G3P3  1. SVD - neonatal death 2. SVD - singleton 3. SVD - twins  Family History: Family  History  Problem Relation Age of Onset  . Diabetes Mother   . Arthritis Mother   . Diabetes Father   . Arthritis Father   . Aneurysm Sister         neck  . Cancer Sister        lung    Social History: Social History   Socioeconomic History  . Marital status: Married    Spouse name: Not on file  . Number of children: 3  . Years of education: Not on file  . Highest education level: Not on file  Social Needs  . Financial resource strain: Not on file  . Food insecurity - worry: Not on file  . Food insecurity - inability: Not on file  . Transportation needs - medical: Not on file  . Transportation needs - non-medical: Not on file  Occupational History  . Occupation: Retired    Fish farm manager: retired  Tobacco Use  . Smoking status: Former Smoker    Packs/day: 1.00    Years: 30.00    Pack years: 30.00    Types: Cigarettes    Last attempt to quit: 06/11/1994    Years since quitting: 22.9  . Smokeless tobacco: Never Used  Substance and Sexual Activity  . Alcohol use: No  . Drug use: No  . Sexual activity: No  Other Topics Concern  . Not on file  Social History Narrative  . Not on file    Allergies: Allergies  Allergen Reactions  . Codeine Nausea And Vomiting  . Latex Rash    Current Medications: Current Outpatient Medications  Medication Sig Dispense Refill  . Calcium-Vitamin D 600-200 MG-UNIT tablet Take 1 tablet by mouth 2 (two) times daily.     . cholecalciferol (VITAMIN D) 1000 units tablet Take 1,000 Units by mouth daily.    . DULoxetine (CYMBALTA) 20 MG capsule Take 1 capsule (20 mg total) by mouth daily. 90 capsule 2  . enoxaparin (LOVENOX) 60 MG/0.6ML injection INJECT 0.6 ML (60 MG TOTAL) INTO THE SKIN EVERY 12 HOURS 60 mL 3  . folic acid (FOLVITE) 1 MG tablet Take 1 tablet (1 mg total) by mouth daily. 90 tablet 3  . furosemide (LASIX) 40 MG tablet Take 1 tablet (40 mg total) by mouth daily as needed for edema. 30 tablet 1  . insulin lispro (HUMALOG) 100 UNIT/ML injection Inject 0.06 mLs (6 Units total) into the skin daily. (Patient taking differently: Inject 0-85 Units into the skin daily. Via insulin  pump) 30 mL 3  . lidocaine-prilocaine (EMLA) cream APPLY 1 APPLICATION TOPICALLY AS NEEDED 1 HOUR PRIOR TO TREATMENT. COVER WITH PRESS N SEAL UNTIL TREATMENT TIME AS DIRECTED 30 g 1  . loratadine (CLARITIN) 10 MG tablet Take 1 tablet by mouth daily as needed.    . magnesium oxide (MAG-OX) 400 (241.3 Mg) MG tablet TAKE 1 TABLET BY MOUTH ONCE DAILY 30 tablet 1  . metoprolol succinate (TOPROL-XL) 12.5 mg TB24 24 hr tablet Take 12.5 mg by mouth 2 (two) times daily.    . metoprolol tartrate (LOPRESSOR) 25 MG tablet Take 0.5 tablets (12.5 mg total) by mouth 2 (two) times daily. 30 tablet 0  . ondansetron (ZOFRAN) 8 MG tablet TAKE 1 TABLET BY MOUTH EVERY 8 HOURS AS NEEDED FOR NAUSEA OR VOMITING 30 tablet 1  . pantoprazole (PROTONIX) 40 MG tablet TAKE 1 TABLET BY MOUTH TWICE (2) DAILY 180 tablet 1  . potassium chloride SA (K-DUR,KLOR-CON) 20 MEQ tablet TAKE 1 TABLET BY MOUTH ONCE A  DAY 90 tablet 1  . predniSONE (DELTASONE) 5 MG tablet Take 5 mg by mouth daily with breakfast.    . vitamin C (ASCORBIC ACID) 500 MG tablet Take 500 mg by mouth daily.    Marland Kitchen zinc gluconate 50 MG tablet Take 50 mg daily by mouth.     Current Facility-Administered Medications  Medication Dose Route Frequency Provider Last Rate Last Dose  . Tbo-Filgrastim (GRANIX) injection 300 mcg  300 mcg Subcutaneous Once Janet Asal, MD       Facility-Administered Medications Ordered in Other Visits  Medication Dose Route Frequency Provider Last Rate Last Dose  . 0.9 %  sodium chloride infusion   Intravenous Continuous Janet Asal, MD   Stopped at 04/24/17 1140  . magnesium sulfate IVPB 4 g 100 mL  4 g Intravenous Once Faythe Casa E, NP      . sodium chloride 0.9 % injection 10 mL  10 mL Intracatheter PRN Corcoran, Melissa C, MD      . sodium chloride 0.9 % injection 10 mL  10 mL Intravenous PRN Janet Asal, MD   10 mL at 04/13/15 8841    Review of Systems General: fatigue and weakness Skin: negative HEENT:  negative Pulmonary: dyspnea chronic Cardiac: negative for pain Gastrointestinal:  Abdominal bloating/distension; tightness BUQ abdomen, diarrhea at times, states GI symptoms got worse when she started magnesium pump. No N/V/, constipation, ostomy [name] functioning well.  Genitourinary/Sexual: bladder function issues with incomplete emptying; bilateral flank pain L>R with worsening symptoms last 2 weeks also associated with Magnesium pump Ob/Gyn: negative for, irregular bleeding, pain Musculoskeletal: positive for back pain  Hematology: negative for, easy bruising, bleeding Neuro: peripheral neuropathy symptoms, chronic secondary from chemotherapy and DM  Objective:  Physical Examination:  BP (!) 156/73   Pulse 87   Temp 97.9 F (36.6 C) (Tympanic)   Resp 18   Ht 5' 3"  (1.6 m)   Wt 130 lb 3.2 oz (59.1 kg)   BMI 23.06 kg/m   Body mass index [name] 23.06 kg/m.  ECOG Performance Status: 2 - Symptomatic, <50% confined to bed  General appearance: alert, cooperative and appears stated age, pale HEENT: ATNC Lymphatic survery; negative [place], axillary, and left inguinal node; enlarged right inguinal node. Abdomen: soft, nondistended and nontender, no mass/ascites/hernias. RLQ tender to deep palpation with deep SubQ ~2-3 cm. Ostomy bag intact,  Incision well healed. SubQ nodules secondary to injections Back:  Extremities: extremities atraumatic, resolved edema Neurological exam reveals alert, oriented, normal speech, no focal findings or movement disorder noted.  Pelvic: EGBUS/Vagina: normal. Vaginal cuff intact. No gross lesions. Uterus/cervic surgically absent.  Bimanual: 2-3 cm nodularity on the right pelvis, on exam. The left side [name] also negative. RV confirms.     Lab Review   Lab Results  Component Value Date   WBC 20.7 (H) 05/22/2017   HGB 8.6 (L) 05/22/2017   HCT 26.5 (L) 05/22/2017   MCV 89.9 05/22/2017   PLT 315 05/22/2017     Chemistry      Component Value Date/Time   NA  134 (L) 05/22/2017 0800   NA 134 (L) 09/27/2012 0459   K 4.0 05/22/2017 0800   K 4.5 09/27/2012 0459   CL 98 (L) 05/22/2017 0800   CL 100 09/27/2012 0459   CO2 27 05/22/2017 0800   CO2 28 09/27/2012 0459   BUN 15 05/22/2017 0800   BUN 8 09/27/2012 0459   CREATININE 0.77 05/22/2017 0800   CREATININE 0.60 09/27/2012 0459  Component Value Date/Time   CALCIUM 9.3 05/22/2017 0800   CALCIUM 8.5 09/27/2012 0459   ALKPHOS 142 (H) 05/22/2017 0800   ALKPHOS 100 09/21/2012 1210   AST 17 05/22/2017 0800   AST 36 09/21/2012 1210   ALT 10 (L) 05/22/2017 0800   ALT 23 09/21/2012 1210   BILITOT 0.5 05/22/2017 0800   BILITOT 0.3 04/08/2016 0751   BILITOT 0.7 09/21/2012 1210         Assessment:  RILEE KNOLL [name] a 72 y.o. 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   Symptomatic right pleural effusion s/p thoracenteses complicated by right-sided empyema and perihepatic abscess. 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 Pelvic exam noted for isolated nodule. Treated with carboplatin and gemcitabine chemotherapy. Progressive disease and platinum-resistance. Single-agent PLD, topotecan, and pembrolizumab sequential therapy, progressive disease. Currently on Abraxane with decreasing CA125 but concerns for progressive disease  on imaging.   Left ureteral obstruction due to disease.   History of partial SBO resolved with conservative management.   Leukocytosis due to Neulasta.   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   Plan:   Problem List Items Addressed This Visit      Genitourinary   Malignant neoplasm of ovary ([place])     Other   Port-A-Cath in place - Primary   Relevant Medications   sodium chloride flush (NS) 0.9 % injection 10 mL (Completed)   heparin lock flush 100 unit/mL (Completed)     I contacted Dr [name] for discussion regarding discrepant CA125 and imaging results. Her imaging was obtained ~ 6 weeks prior to initiation of Abraxane and may not represent disease status or her CA125 result may be incorrect. We recommended repeating CA125 today. However, I am worried that her symptoms may indicate clinical progression. If decrease confirmed continue with Abraxane as scheduled. If elevated follow up with [name] regarding other treatment options. Her options are limited due to peripheral neuropathy and I do not think she would be eligible for a clinical trial.    Ureteral obstruction. I discussed options including stent vs PCN and Urology referral. She has seen Urology in the past. We briefly reviewed pros/cons of intervention. The PCNs are associated with increase UTI and hospitalizations. Without intervention the kidney will lose function. But she will still have function from the other contralateral kidney.  We discussed again that her cancer [name] incurable, and important goals of care. Hopefully therapy can enhance duration of survival, manage cancer-related symptoms, specifically reduce her pain symptoms which are quite debilitating, and maximize quality of life. She has HCPOA and Advanced care directives in place.  I  reviewed Hospice, and DNAR. She [name] DNAR and has discussed these issues with [name], but it sounds like she does not have a Stop Sign. I recommended that she discuss further during her visit on Monday and  review the pros/cons of further therapy. Her performance status [name] adequate for further treatment, but options are limited (oral etoposide, oral cyclophosphamide or IV ifosfamide, atretamine, capecitabine, premetrexed, vinorelbine, melphalan, or tamoxifen). I am concerned about the toxicity of some of these agents and if she wanted to pursue further therapy would consider oral metronomic cyclophosphamide. Tamoxifen may also be a reasonable option. She will review further with [name].   We can see her back in 3 months or as needed for repeat pelvic exam.   Gillis Ends, MD  CC:   Referring Provider: Lequita Asal, MD  Additional Care Provider: Dr. Harvest [name]. Dr. Percell [place]

## 2017-05-25 ENCOUNTER — Inpatient Hospital Stay: Payer: Medicare Other

## 2017-05-25 DIAGNOSIS — C561 Malignant neoplasm of right ovary: Secondary | ICD-10-CM

## 2017-05-25 DIAGNOSIS — C800 Disseminated malignant neoplasm, unspecified: Secondary | ICD-10-CM

## 2017-05-25 LAB — BASIC METABOLIC PANEL
Anion gap: 10 (ref 5–15)
BUN: 18 mg/dL (ref 6–20)
CO2: 27 mmol/L (ref 22–32)
Calcium: 8.9 mg/dL (ref 8.9–10.3)
Chloride: 98 mmol/L — ABNORMAL LOW (ref 101–111)
Creatinine, Ser: 0.75 mg/dL (ref 0.44–1.00)
GFR calc Af Amer: >60 mL/min (ref >60)
GFR calc non Af Amer: >60 mL/min (ref >60)
Glucose, Bld: 257 mg/dL — ABNORMAL HIGH (ref 65–99)
Potassium: 4 mmol/L (ref 3.5–5.1)
Sodium: 135 mmol/L (ref 135–145)

## 2017-05-25 LAB — CA 125: CANCER ANTIGEN (CA) 125: 189.1 U/mL — AB (ref 0.0–38.1)

## 2017-05-25 LAB — MAGNESIUM: Magnesium: 1.3 mg/dL — ABNORMAL LOW (ref 1.7–2.4)

## 2017-05-25 MED ORDER — SODIUM CHLORIDE 0.9% FLUSH
10.0000 mL | INTRAVENOUS | Status: DC | PRN
  Administered 2017-05-25: 10 mL via INTRAVENOUS
  Filled 2017-05-25: qty 10

## 2017-05-25 MED ORDER — HEPARIN SOD (PORK) LOCK FLUSH 100 UNIT/ML IV SOLN: 500.0000 [IU] | Freq: Once | INTRAVENOUS | Status: DC

## 2017-05-25 MED ORDER — SODIUM CHLORIDE 0.9 % IV SOLN
6.0000 g | Freq: Once | INTRAVENOUS | Status: DC
  Administered 2017-05-25: 6 g via INTRAVENOUS
  Filled 2017-05-25: qty 2

## 2017-05-26 ENCOUNTER — Other Ambulatory Visit: Payer: Self-pay | Admitting: *Deleted

## 2017-05-26 ENCOUNTER — Inpatient Hospital Stay: Payer: Medicare Other

## 2017-05-26 DIAGNOSIS — C561 Malignant neoplasm of right ovary: Secondary | ICD-10-CM | POA: Diagnosis not present

## 2017-05-26 DIAGNOSIS — C800 Disseminated malignant neoplasm, unspecified: Principal | ICD-10-CM

## 2017-05-26 DIAGNOSIS — C801 Malignant (primary) neoplasm, unspecified: Secondary | ICD-10-CM

## 2017-05-26 MED ORDER — HEPARIN SOD (PORK) LOCK FLUSH 100 UNIT/ML IV SOLN
500.0000 [IU] | Freq: Once | INTRAVENOUS | Status: AC
  Administered 2017-05-26: 500 [IU] via INTRAVENOUS

## 2017-05-26 MED ORDER — SODIUM CHLORIDE 0.9% FLUSH
10.0000 mL | INTRAVENOUS | Status: DC | PRN
  Administered 2017-05-26: 10 mL via INTRAVENOUS
  Filled 2017-05-26: qty 10

## 2017-05-26 MED ORDER — HEPARIN SOD (PORK) LOCK FLUSH 100 UNIT/ML IV SOLN
INTRAVENOUS | Status: AC
  Filled 2017-05-26: qty 5

## 2017-05-28 ENCOUNTER — Other Ambulatory Visit: Payer: Self-pay | Admitting: Hematology and Oncology

## 2017-05-29 ENCOUNTER — Encounter: Payer: Self-pay | Admitting: Hematology and Oncology

## 2017-05-29 ENCOUNTER — Inpatient Hospital Stay: Payer: Medicare Other

## 2017-05-29 ENCOUNTER — Inpatient Hospital Stay: Payer: Medicare Other | Admitting: Hematology and Oncology

## 2017-05-29 VITALS — BP 143/66 | HR 97 | Temp 98.2°F | Resp 18 | Wt 135.0 lb

## 2017-05-29 DIAGNOSIS — G629 Polyneuropathy, unspecified: Secondary | ICD-10-CM

## 2017-05-29 DIAGNOSIS — Z7189 Other specified counseling: Secondary | ICD-10-CM

## 2017-05-29 DIAGNOSIS — C7919 Secondary malignant neoplasm of other urinary organs: Secondary | ICD-10-CM

## 2017-05-29 DIAGNOSIS — C800 Disseminated malignant neoplasm, unspecified: Principal | ICD-10-CM

## 2017-05-29 DIAGNOSIS — I252 Old myocardial infarction: Secondary | ICD-10-CM

## 2017-05-29 DIAGNOSIS — I509 Heart failure, unspecified: Secondary | ICD-10-CM | POA: Diagnosis not present

## 2017-05-29 DIAGNOSIS — Z5111 Encounter for antineoplastic chemotherapy: Secondary | ICD-10-CM | POA: Diagnosis not present

## 2017-05-29 DIAGNOSIS — Z79899 Other long term (current) drug therapy: Secondary | ICD-10-CM

## 2017-05-29 DIAGNOSIS — C786 Secondary malignant neoplasm of retroperitoneum and peritoneum: Secondary | ICD-10-CM

## 2017-05-29 DIAGNOSIS — R948 Abnormal results of function studies of other organs and systems: Secondary | ICD-10-CM | POA: Diagnosis not present

## 2017-05-29 DIAGNOSIS — C561 Malignant neoplasm of right ovary: Secondary | ICD-10-CM | POA: Diagnosis not present

## 2017-05-29 DIAGNOSIS — I10 Essential (primary) hypertension: Secondary | ICD-10-CM

## 2017-05-29 DIAGNOSIS — C801 Malignant (primary) neoplasm, unspecified: Secondary | ICD-10-CM

## 2017-05-29 DIAGNOSIS — E785 Hyperlipidemia, unspecified: Secondary | ICD-10-CM

## 2017-05-29 DIAGNOSIS — K222 Esophageal obstruction: Secondary | ICD-10-CM

## 2017-05-29 DIAGNOSIS — K449 Diaphragmatic hernia without obstruction or gangrene: Secondary | ICD-10-CM

## 2017-05-29 DIAGNOSIS — I2510 Atherosclerotic heart disease of native coronary artery without angina pectoris: Secondary | ICD-10-CM

## 2017-05-29 DIAGNOSIS — D631 Anemia in chronic kidney disease: Secondary | ICD-10-CM

## 2017-05-29 DIAGNOSIS — E109 Type 1 diabetes mellitus without complications: Secondary | ICD-10-CM

## 2017-05-29 DIAGNOSIS — M069 Rheumatoid arthritis, unspecified: Secondary | ICD-10-CM

## 2017-05-29 DIAGNOSIS — R0602 Shortness of breath: Secondary | ICD-10-CM

## 2017-05-29 DIAGNOSIS — Z87442 Personal history of urinary calculi: Secondary | ICD-10-CM

## 2017-05-29 DIAGNOSIS — Z8701 Personal history of pneumonia (recurrent): Secondary | ICD-10-CM

## 2017-05-29 DIAGNOSIS — R634 Abnormal weight loss: Secondary | ICD-10-CM | POA: Diagnosis not present

## 2017-05-29 DIAGNOSIS — Z90710 Acquired absence of both cervix and uterus: Secondary | ICD-10-CM

## 2017-05-29 DIAGNOSIS — I998 Other disorder of circulatory system: Secondary | ICD-10-CM

## 2017-05-29 DIAGNOSIS — D709 Neutropenia, unspecified: Secondary | ICD-10-CM | POA: Diagnosis not present

## 2017-05-29 DIAGNOSIS — Z7952 Long term (current) use of systemic steroids: Secondary | ICD-10-CM

## 2017-05-29 DIAGNOSIS — Z87891 Personal history of nicotine dependence: Secondary | ICD-10-CM

## 2017-05-29 DIAGNOSIS — D61818 Other pancytopenia: Secondary | ICD-10-CM

## 2017-05-29 DIAGNOSIS — Z801 Family history of malignant neoplasm of trachea, bronchus and lung: Secondary | ICD-10-CM

## 2017-05-29 DIAGNOSIS — Z7689 Persons encountering health services in other specified circumstances: Secondary | ICD-10-CM | POA: Diagnosis not present

## 2017-05-29 DIAGNOSIS — J90 Pleural effusion, not elsewhere classified: Secondary | ICD-10-CM

## 2017-05-29 DIAGNOSIS — C569 Malignant neoplasm of unspecified ovary: Secondary | ICD-10-CM

## 2017-05-29 DIAGNOSIS — E876 Hypokalemia: Secondary | ICD-10-CM

## 2017-05-29 DIAGNOSIS — Z90722 Acquired absence of ovaries, bilateral: Secondary | ICD-10-CM

## 2017-05-29 DIAGNOSIS — Z794 Long term (current) use of insulin: Secondary | ICD-10-CM

## 2017-05-29 DIAGNOSIS — D72819 Decreased white blood cell count, unspecified: Secondary | ICD-10-CM

## 2017-05-29 DIAGNOSIS — K219 Gastro-esophageal reflux disease without esophagitis: Secondary | ICD-10-CM

## 2017-05-29 LAB — CBC WITH DIFFERENTIAL/PLATELET
Basophils Absolute: 0.1 10*3/uL (ref 0–0.1)
Basophils Relative: 1 %
Eosinophils Absolute: 0 10*3/uL (ref 0–0.7)
Eosinophils Relative: 0 %
HCT: 25.2 % — ABNORMAL LOW (ref 35.0–47.0)
Hemoglobin: 8.2 g/dL — ABNORMAL LOW (ref 12.0–16.0)
Lymphocytes Relative: 9 %
Lymphs Abs: 0.8 10*3/uL — ABNORMAL LOW (ref 1.0–3.6)
MCH: 29.1 pg (ref 26.0–34.0)
MCHC: 32.7 g/dL (ref 32.0–36.0)
MCV: 88.8 fL (ref 80.0–100.0)
Monocytes Absolute: 0.6 10*3/uL (ref 0.2–0.9)
Monocytes Relative: 7 %
Neutro Abs: 6.9 10*3/uL — ABNORMAL HIGH (ref 1.4–6.5)
Neutrophils Relative %: 83 %
Platelets: 262 10*3/uL (ref 150–440)
RBC: 2.84 MIL/uL — ABNORMAL LOW (ref 3.80–5.20)
RDW: 20.5 % — ABNORMAL HIGH (ref 11.5–14.5)
WBC: 8.3 10*3/uL (ref 3.6–11.0)

## 2017-05-29 LAB — COMPREHENSIVE METABOLIC PANEL
ALT: 8 U/L — ABNORMAL LOW (ref 14–54)
AST: 15 U/L (ref 15–41)
Albumin: 3.2 g/dL — ABNORMAL LOW (ref 3.5–5.0)
Alkaline Phosphatase: 102 U/L (ref 38–126)
Anion gap: 7 (ref 5–15)
BUN: 22 mg/dL — ABNORMAL HIGH (ref 6–20)
CO2: 27 mmol/L (ref 22–32)
Calcium: 8.9 mg/dL (ref 8.9–10.3)
Chloride: 101 mmol/L (ref 101–111)
Creatinine, Ser: 0.69 mg/dL (ref 0.44–1.00)
GFR calc Af Amer: >60 mL/min (ref >60)
GFR calc non Af Amer: >60 mL/min (ref >60)
Glucose, Bld: 316 mg/dL — ABNORMAL HIGH (ref 65–99)
Potassium: 3.7 mmol/L (ref 3.5–5.1)
Sodium: 135 mmol/L (ref 135–145)
Total Bilirubin: 0.7 mg/dL (ref 0.3–1.2)
Total Protein: 6.6 g/dL (ref 6.5–8.1)

## 2017-05-29 LAB — MAGNESIUM: Magnesium: 1.3 mg/dL — ABNORMAL LOW (ref 1.7–2.4)

## 2017-05-29 MED ORDER — HEPARIN SOD (PORK) LOCK FLUSH 100 UNIT/ML IV SOLN
500.0000 [IU] | Freq: Once | INTRAVENOUS | Status: AC | PRN
  Administered 2017-05-29: 500 [IU]
  Filled 2017-05-29: qty 5

## 2017-05-29 MED ORDER — SODIUM CHLORIDE 0.9 % IV SOLN
6.0000 g | Freq: Once | INTRAVENOUS | Status: AC
  Administered 2017-05-29: 6 g via INTRAVENOUS
  Filled 2017-05-29: qty 10

## 2017-05-29 MED ORDER — PACLITAXEL PROTEIN-BOUND CHEMO INJECTION 100 MG
80.0000 mg/m2 | Freq: Once | INTRAVENOUS | Status: AC
  Administered 2017-05-29: 125 mg via INTRAVENOUS
  Filled 2017-05-29: qty 25

## 2017-05-29 MED ORDER — SODIUM CHLORIDE 0.9 % IV SOLN
Freq: Once | INTRAVENOUS | Status: AC
  Administered 2017-05-29: 12:00:00 via INTRAVENOUS
  Filled 2017-05-29: qty 1000

## 2017-05-29 MED ORDER — SODIUM CHLORIDE 0.9% FLUSH
10.0000 mL | INTRAVENOUS | Status: DC | PRN
  Filled 2017-05-29: qty 10

## 2017-05-29 MED ORDER — ONDANSETRON HCL 4 MG PO TABS
8.0000 mg | ORAL_TABLET | Freq: Once | ORAL | Status: AC
  Administered 2017-05-29: 8 mg via ORAL
  Filled 2017-05-29: qty 2

## 2017-05-29 NOTE — Progress Notes (Signed)
[facility] day:  05/29/2017   Chief Complaint: [name] is a 72 y.o. female with stage IV ovarian cancer who is seen for review of interval scans and assessment prior to day 1 of cycle #4 Abraxane.  HPI:  The patient was last seen in the medical oncology clinic on 05/15/2017.  At that time, she felt "good".  She had a grade II neuropathy that was stable and not limiting her functioning.  Exam was stable.  WBC was 3000 with an [place] of 2000.  Magnesium was 1.2.  She received day 15 of Abraxane.  She received 6 gm of magnesium.  She received Neulasta on 05/16/2017.  Chest, abdomen, and pelvic CT on 05/22/2017 revealed  significant enlargement of metastatic disease involving the right pleura, low paratracheal nodal chain, omentum, peritoneal surfaces, mesentery, and liver. Tumors extending through the ostomy site into the subcutaneous tissues. There were multiple new metastatic liver and right pleural lesions.  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  There was new sclerotic lesion in the T11 vertebral body, concerning for possible metastatic disease. There was healing anterior nondisplaced rib fractures of the right second through ninth ribs that appeared new.  CA125 decreased from 907.1 on 03/27/2017 to 192.1 on 05/22/2017.  Repeat on 05/24/2017 was 189.1.  She saw Dr. Theora Gianotti on 05/24/2017. We discussed the discrepenacy between her declining CA125 and imaging.  Her imaging was obtained ~ 6 weeks prior to initiation of Abraxane and may not represent disease status or her CA125 result was incorrect.  Repeating CA125 was ordered. Ureteral obstruction was discussed with options including stent vs PCN and urology referral. Code status was readdressed.  During the interim, patient notes that she is more "short winded" than normal. She  states, "It feels like something is sitting on my chest at times". Patient has been having low blood pressures at home. Her DBPs have been running in the 40s. She experiences vertiginous symptoms with position changes. Patient complains pain over her kidneys bilaterally; L>R.   Patient continues to experience neuropathy in her hands. She is not experiencing any B symptoms or interval infections. Patient is eating well. Her weight has increased 5 pounds.   Past Medical History:  Diagnosis Date  . CAD (coronary artery disease)    a. 09/2012 Cath: LM nl, LAD 95p, 74m LCX 982mOM2 50, RCA 100.  . Marland KitchenHF (congestive heart failure) ([facility]amond[place]  . Cholelithiasis   . Collagen vascular disease (HCC)    Rheumatoid Arthritis.  . Esophageal stricture   . Exertional shortness of breath   . GERD (gastroesophageal reflux disease)   . H/O hiatal hernia   . Herniated disc   . History of pancytopenia   . Hyperlipidemia   . Hypertension   . Hypokalemia   . Leukopenia 2012   s/p bone marrow biopsy, Dr. PaMa Hillock. NSTEMI (non-ST elevated myocardial infarction) (HCOnycha5/2014   "mild" (10/18/2012)  . Ovarian cancer (HCRye2016   chemo and hysterectomy  . Pneumonia 2013; 08/2012   "one lung; double" (10/18/2012)  . Rheumatoid arthritis(714.0)   . Type I diabetes mellitus (HCKey Vista   "dx'd in 1957" (10/18/2012)    Past Surgical History:  Procedure Laterality Date  . ABDOMINAL HYSTERECTOMY  06/15/2015   Dx L/S, EXLAP TAH BSO omentectomy RSRx colostomy diaphragm resection stripping  . CARDIAC  CATHETERIZATION  10/18/2012   "first one was today" (10/18/2012)  . CATARACT EXTRACTION W/ INTRAOCULAR LENS IMPLANT Right 2010  . CHOLECYSTECTOMY  06/15/2015   combined case with ovarian cancer debulking  . COLON SURGERY    . CORONARY ARTERY BYPASS GRAFT N/A 10/19/2012   Procedure: CORONARY ARTERY BYPASS GRAFTING (CABG);  Surgeon: Melrose Nakayama, MD;  Location: [facility];  Service: Open Heart Surgery;  Laterality: N/A;  .  ESOPHAGEAL DILATION     "3 or 4 times" (10/19/2011)  . ESOPHAGOGASTRODUODENOSCOPY  2012   Dr. Minna Merritts  . OSTOMY    . OVARY SURGERY     removal  . PERIPHERAL VASCULAR CATHETERIZATION N/A 03/02/2015   Procedure: Glori Luis Cath Insertion;  Surgeon: Algernon Huxley, MD;  Location: [facility];  Service: Cardiovascular;  Laterality: N/A;  . TUBAL LIGATION  1970    Family History  Problem Relation Age of Onset  . Diabetes Mother   . Arthritis Mother   . Diabetes Father   . Arthritis Father   . Aneurysm Sister        neck  . Cancer Sister        lung    Social History:  reports that she quit smoking about 22 years ago. Her smoking use included cigarettes. She has a 30.00 pack-year smoking history. she has never used smokeless tobacco. She reports that she does not drink alcohol or use drugs.  Patient is a retired Art therapist. Patient denies exposure to radiation and toxins. She spends every Thursday afternoon with her sister.  She is accompanied by her husband, son [name]), and daughter [name]) today.  Allergies:  Allergies  Allergen Reactions  . Codeine Nausea And Vomiting  . Latex Rash    Current Medications: Current Outpatient Medications  Medication Sig Dispense Refill  . Calcium-Vitamin D 600-200 MG-UNIT tablet Take 1 tablet by mouth 2 (two) times daily.     . cholecalciferol (VITAMIN D) 1000 units tablet Take 1,000 Units by mouth daily.    . DULoxetine (CYMBALTA) 20 MG capsule Take 1 capsule (20 mg total) by mouth daily. 90 capsule 2  . enoxaparin (LOVENOX) 60 MG/0.6ML injection INJECT 0.6 ML (60 MG TOTAL) INTO THE SKIN EVERY 12 HOURS 60 mL 3  . folic acid (FOLVITE) 1 MG tablet Take 1 tablet (1 mg total) by mouth daily. 90 tablet 3  . furosemide (LASIX) 40 MG tablet Take 1 tablet (40 mg total) by mouth daily as needed for edema. 30 tablet 1  . insulin lispro (HUMALOG) 100 UNIT/ML injection Inject 0.06 mLs (6 Units total) into the skin daily. (Patient taking differently:  Inject 0-85 Units into the skin daily. Via insulin pump) 30 mL 3  . lidocaine-prilocaine (EMLA) cream APPLY 1 APPLICATION TOPICALLY AS NEEDED 1 HOUR PRIOR TO TREATMENT. COVER WITH PRESS N SEAL UNTIL TREATMENT TIME AS DIRECTED 30 g 1  . loratadine (CLARITIN) 10 MG tablet Take 1 tablet by mouth daily as needed.    . magnesium oxide (MAG-OX) 400 (241.3 Mg) MG tablet TAKE 1 TABLET BY MOUTH ONCE DAILY 30 tablet 1  . metoprolol tartrate (LOPRESSOR) 25 MG tablet Take 0.5 tablets (12.5 mg total) by mouth 2 (two) times daily. 30 tablet 0  . ondansetron (ZOFRAN) 8 MG tablet TAKE 1 TABLET BY MOUTH EVERY 8 HOURS AS NEEDED FOR NAUSEA OR VOMITING 30 tablet 1  . pantoprazole (PROTONIX) 40 MG tablet TAKE 1 TABLET BY MOUTH TWICE (2) DAILY 180 tablet 1  . potassium chloride  SA (K-DUR,KLOR-CON) 20 MEQ tablet TAKE 1 TABLET BY MOUTH ONCE A DAY 90 tablet 1  . predniSONE (DELTASONE) 5 MG tablet Take 5 mg by mouth daily with breakfast.    . vitamin C (ASCORBIC ACID) 500 MG tablet Take 500 mg by mouth daily.    Marland Kitchen zinc gluconate 50 MG tablet Take 50 mg daily by mouth.    . metoprolol succinate (TOPROL-XL) 12.5 mg TB24 24 hr tablet Take 12.5 mg by mouth 2 (two) times daily.     Current Facility-Administered Medications  Medication Dose Route Frequency Provider Last Rate Last Dose  . Tbo-Filgrastim (GRANIX) injection 300 mcg  300 mcg Subcutaneous Once Lequita Asal, MD       Facility-Administered Medications Ordered in Other Visits  Medication Dose Route Frequency Provider Last Rate Last Dose  . 0.9 %  sodium chloride infusion   Intravenous Continuous Lequita Asal, MD   Stopped at 04/24/17 1140  . magnesium sulfate IVPB 4 g 100 mL  4 g Intravenous Once Faythe Casa E, NP      . sodium chloride 0.9 % injection 10 mL  10 mL Intracatheter PRN Corcoran, Melissa C, MD      . sodium chloride 0.9 % injection 10 mL  10 mL Intravenous PRN Lequita Asal, MD   10 mL at 04/13/15 5102    Review of Systems:   GENERAL:  Feels "not very good".  No fever, chills or sweats.  Weight up 5 pounds. PERFORMANCE STATUS (ECOG):  1 HEENT:  No visual changes, sore throat, mouth sores or tenderness. Lungs:  Shortness of breath on exertion (see HPI).  No cough.  No hemoptysis. Cardiac:  No chest pain, palpitations, orthopnea, or PND. GI:  Abdomen tighter.  No nausea, vomiting, diarrhea, melena or hematochezia. GU:  No urgency, frequency, dysuria, or hematuria. Musculoskeletal:  Back pain.  Severe rheumatoid arthritis (on prednisone).  Osteoporosis.  No back pain. No muscle tenderness. Extremities:  No pain or swelling. Skin:  No increased bruising or bleeding.  No rashes or skin changes. Neuro:  Neuropathy in hands and feet (no change).  No headache, numbness or weakness, balance or coordination issues. Endocrine:  Diabetes on an insulin pump.  Blood sugar elevated on steroids.  Occasional hot flashes.  Psych:  No mood changes, depression or anxiety. Pain:  3/10 - lower back. Review of systems:  All other systems reviewed and found to be negative.  Physical Exam:  Blood pressure (!) 143/66, pulse 97, temperature 98.2 F (36.8 C), temperature source Tympanic, resp. rate 18, weight 135 lb (61.2 kg), SpO2 96 %. GENERAL:  Well developed, well nourished woman sitting comfortably in the exam room in no acute distress.  She has a cane at her side. MENTAL STATUS:  Alert and oriented to person, place and time. HEAD:  Wearing a light silver blue cap.  Alopecia.  Normocephalic, atraumatic, face symmetric, no Cushingoid features. EYES:  Gold rimmed glasses.  Blue eyes.  No conjunctivitis or scleral icterus.  ENT: Oropharynx clear without lesion. Tongue normal. Mucous membranes moist.  RESPIRATORY: Decreased breath sounds right base.  No rales, wheezes or rhonchi. CARDIOVASCULAR: Regular rate and rhythm without murmur, rub or gallop. ABDOMEN: Non-distended.  Soft, non-tender with active bowel sounds and no  appreciable hepatosplenomegaly. No palpable nodularity or masses.  Insulin pump.  Ostomy bag. SKIN: No rashes or ulcers. EXTREMITIES:  No edema, skin discoloration or tenderness. No palpable cords. LYMPH NODES: No palpable cervical, supraclavicular, axillary or inguinal adenopathy  NEUROLOGICAL: Appropriate. PSYCH: Appropriate.   Radiology studies: 02/13/2015:  Abdomen and pelvic CT revealed bilateral mass-like adnexal regions (right adnexal mass 5.6 x 5.0 cm and the left adnexa mass 10.3 x 6.0 x 9.9 cm).  There was a large amount of soft tissue throughout the peritoneal cavity involving the omentum and other peritoneal surfaces.  There was a small volume ascites. There was a peripheral 3.3 x 1.9 cm low-attenuation lesion overlying the right lobe of the liver, likely representing a serosal implant. There was 1.4 x 2.2 cm ill-defined peripheral lesion within the inferior aspect of segment 6 adjacent to the ampulla in the duodenum.  04/28/2015:  Abdominal and pelvic CT revealed decreasing bilateral ovarian masses.   The left adnexal mass measured 4.0 x 6.1 cm (previously 5.0 x 8.5 cm). The right ovary measured 3.8 x 4.9 cm (previously 4.7 x 5.6 cm).  There was improved peritoneal carcinomatosis.  There was a small amount of ascites.  The hepatic dome lesion was stable. The previously seen right hepatic lobe lesion was not well-visualized.  There was a small left pleural effusion and trace right pleural effusion.  The nodular lesion at the ampulla of Vater, extending into the duodenum was stable.  There was no biliary ductal dilatation. 08/01/2015:  Rght upper extremity ultrasound revealed a near occlusive thrombus within the central portion of the right internal jugular vein and central portion of the right subclavian vein. 09/01/2015:  Chest, abdomen, and pelvic CT revealed continued decrease in perihepatic fluid collection contiguous with the right pleural space with percutaneous drain.   09/11/2015:  Chest, abdomen, and pelvic CT revealed a residual versus recurrent fluid collection posterior to the right hepatic lobe (2.6 x 1.1 x 4.0 cm).   10/13/2015:  Chest, abdomen, and pelvic CT revealed resolution of the empyema. 02/15/2016:  Chest, abdomen, and pelvic CT revealed multiple soft tissue nodules throughout the pelvis, small bowel mesenteric and peritoneum and, concerning for peritoneal metastatic disease.  There was soft tissue irregularity within the left upper quadrant.  There was mild right hydronephrosis (etiology unclear).  There was interval resolution of previously described right pleural based fluid and gas collection. There was a pleural based nodule within the right lower hemi-thorax concerning for pleural based metastasis.  There were small bilateral pleural effusions.  There was pulmonary nodularity, predominately within the left upper lobe (metastatic or infectious/inflammatory etiology).  There was slightly increased mediastinal adenopathy (infectious/inflammatory or metastatic).  There was a low attenuation lesion within the left hepatic lobe (complicated fluid within the fissure or metastatic disease). 03/08/2016:  Abdomen and pelvic CT revealed mild progression of peritoneal disease in the abdomen.  There was interval increase in loculated fluid around the lateral segment left liver, stomach, and spleen.  There was persistent soft tissue lesion at the level of the ampulla with mild intra and extrahepatic biliary duct dilatation.  There was persistent intrahepatic and capsular metastatic disease involving the liver. 05/26/2016:  Head MRI revealed multiple small areas of acute infarct involving the occipital parietal lobe bilaterally and the left lateral cerebellum,  consistent with posterior circulation emboli.  06/10/2016:  Adomen and pelvic CT revealed progression of peritoneal carcinomatosis predominantly in the perihepatic space, bilateral lower quadrants and pelvis.   There was worsening bilateral obstructive uropathy due to malignant involvement of the pelvic ureters.  There was stable small pleural/subpleural nodules at the right lung base.  There was stable small left pleural effusion.  There was decreased small volume perihepatic ascites.  08/09/2016:  Abdomen and pelvic CT revealed interval increase and loculated fluid collections within the peritoneal space consistent with progression of peritoneal ovarian carcinoma metastasis.  There was one large 7 cm collection in the RIGHT lower quadrant which had increased significantly in size and may represent of point of small bowel obstruction.  The proximal stomach and duodenum were decompressed. The mid small bowel was mildly dilated. There was concern for obstruction given the large intraperitoneal fluid collection.  There was increase in subcapsular hepatic metastatic fluid collections.  There was enlargement of rounded ampullary lesion in the second portion duodenum concerning for metastatic lesion.  There was mild biliary duct dilatation (stable).  There was nodular pleural metastasis in the RIGHT lower lobe pleural space. 11/03/2016:  Abdomen and pelvic CT revealed multifocal cystic metastases in the abdomen/pelvis, reflecting peritoneal disease.  The dominant lesion in the left anterior abdomen has progressed, while additional lesions were mixed.  Peritoneal disease along the liver and spleen progressed.  Pleural-based metastases along the posterior right hemithorax progressed. There were trace left pleural effusion.  There was a stable 1.7 cm duodenal lesion at the ampulla. 11/29/2016:  Abdomen and pelvic CT revealed mild interval enlargement of pleural metastasis the RIGHT lower lobe.  There was overall mild decrease in cystic peritoneal metastasis within the abdomen pelvis. The solid lesion at the terminal ileum was decreased in size. Subcapsular lesion in the liver was slightly decreased in size.  There was no  evidence of disease progression the abdomen pelvis.  There was mild intrahepatic duct dilatation similar prior.  The ampullary lesion was slightly larger.  There was mild hydronephrosis and hydroureter on the LEFT likely related to cystic metastasis at the LEFT vesicoureteral junction 12/09/2016:  MRCP revealed the cystic lesion within the pancreatic head/uncinate process had decreased in size and demonstrated no suspicious characteristics. This can be presumed a pseudocyst or focus of side branch duct ectasia.  There was similar to slight progression of extensive peritoneal metastasis since 11/29/2016. Grossly similar abdominal nodal and right pleural metastasis.  There was chronic mild common duct dilatation with similar soft tissue fullness about the ampulla compared to 11/29/2016.  There was improved to resolved left-sided hydronephrosis. 01/20/2017:  Abdomen and pelvic CT revealed complex mixed interval changes in widespread metastatic disease in the peritoneal cavity, lower chest and thoracic, abdominal and pelvic lymph nodes. Right lower chest pleural metastasis and several of the peritoneal tumor implants had increased.  There was new right inguinal lymphadenopathy.  Some of the peritoneal tumor implants had decreased.  The small dependent left pleural effusion was slightly increased .  The mild left hydroureteronephrosis was improved.  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   The chronic biliary ductal dilatation was stable .  There was no evidence of bowel obstruction. 05/22/2017:  Chest, abdomen, and pelvic CT revealed  significant enlargement of metastatic disease involving the right pleura, low paratracheal nodal chain, omentum, peritoneal surfaces, mesentery, and liver. Tumors extending through the ostomy site into the subcutaneous tissues. There were multiple new metastatic liver and right pleural lesions.  A left pelvic mass was implicated in obstruction of the left ureter, with prominent left  hydronephrosis and hydroureter, but only subtle delay in excretion resulting.  There was new sclerotic lesion in the T11 vertebral body, concerning for possible metastatic disease. There was healing anterior nondisplaced rib fractures of the right second through ninth ribs that appeared new.   Admissions: [facility] from 06/15/2015 - 06/22/2015.  She underwent exploratory laparotomy, lysis of adhesions,  total abdominal hysterectomy with bilateral salpingo-oophorectomy, infracolic omentectomy, optimal tumor debulking(< 1 cm), recto-sigmoid resection with creation of end colostomy, cholecystectomy, mobilization of splenic flexure and liver with diaphragmatic stripping on 06/15/2015. The right diaphragm was cleared of tumor. During dissection, the diaphragm was entered and closed with sutures.  [facility] from 07/01/2015 - 07/03/2015 with pleural effusion. [facility] from 07/07/2015 - 07/09/2015 with recurrent pleural effusion. [facility] from 07/29/2015 - 08/10/2015 with a right-sided empyema and liver abscess. She underwent CT-guided placement of a liver abscess drain on 07/30/2015.  Liver abscess culture grew out group B strep and Enterobacter which was sensitive to Zosyn. She was transitioned to ertapenem Colbert Ewing) prior to discharge.  She was readmitted to [facility] on 09/17/2015. [facility] from 05/26/2016 - 05/28/2016 with altered mental status and an acute embolic CVA.  Head MRI on 05/26/2016 revealed multiple small areas of acute infarct involving the occipital parietal lobe bilaterally and the left lateral cerebellum,  consistent with posterior circulation emboli.  Work-up included a negative carotid ultrasound and echocardiogram. [facility] from 08/10/2016 - 08/14/2016 with a small bowel obstruction.  She was managed conservatively. [facility] from 09/27/2016 - 09/28/2016 with symptomatic anemia and diarrhea.   Appointment on 05/29/2017  Component Date Value Ref Range Status  . WBC 05/29/2017 8.3  3.6 - 11.0 K/uL Final  . RBC 05/29/2017  2.84* 3.80 - 5.20 MIL/uL Final  . Hemoglobin 05/29/2017 8.2* 12.0 - 16.0 g/dL Final  . HCT 05/29/2017 25.2* 35.0 - 47.0 % Final  . MCV 05/29/2017 88.8  80.0 - 100.0 fL Final  . MCH 05/29/2017 29.1  26.0 - 34.0 pg Final  . MCHC 05/29/2017 32.7  32.0 - 36.0 g/dL Final  . RDW 05/29/2017 20.5* 11.5 - 14.5 % Final  . Platelets 05/29/2017 262  150 - 440 K/uL Final  . Neutrophils Relative % 05/29/2017 83  % Final  . Neutro Abs 05/29/2017 6.9* 1.4 - 6.5 K/uL Final  . Lymphocytes Relative 05/29/2017 9  % Final  . Lymphs Abs 05/29/2017 0.8* 1.0 - 3.6 K/uL Final  . Monocytes Relative 05/29/2017 7  % Final  . Monocytes Absolute 05/29/2017 0.6  0.2 - 0.9 K/uL Final  . Eosinophils Relative 05/29/2017 0  % Final  . Eosinophils Absolute 05/29/2017 0.0  0 - 0.7 K/uL Final  . Basophils Relative 05/29/2017 1  % Final  . Basophils Absolute 05/29/2017 0.1  0 - 0.1 K/uL Final   Performed at [facility], [address] [place], [place] [address]  . Sodium 05/29/2017 135  135 - 145 mmol/L Final  . Potassium 05/29/2017 3.7  3.5 - 5.1 mmol/L Final  . Chloride 05/29/2017 101  101 - 111 mmol/L Final  . CO2 05/29/2017 27  22 - 32 mmol/L Final  . Glucose, Bld 05/29/2017 316* 65 - 99 mg/dL Final  . BUN 05/29/2017 22* 6 - 20 mg/dL Final  . Creatinine, Ser 05/29/2017 0.69  0.44 - 1.00 mg/dL Final  . Calcium 05/29/2017 8.9  8.9 - 10.3 mg/dL Final  . Total Protein 05/29/2017 6.6  6.5 - 8.1 g/dL Final  . Albumin 05/29/2017 3.2* 3.5 - 5.0 g/dL Final  . AST 05/29/2017 15  15 - 41 U/L Final  . ALT 05/29/2017 8* 14 - 54 U/L Final  . Alkaline Phosphatase 05/29/2017 102  38 - 126 U/L Final  . Total Bilirubin 05/29/2017 0.7  0.3 - 1.2 mg/dL Final  . GFR calc non Af Amer 05/29/2017 >60  >60 mL/min Final  . GFR calc Af Wyvonnia Lora  05/29/2017 >60  >60 mL/min Final   Comment: (NOTE) The eGFR has been calculated using the CKD EPI equation. This calculation has not been validated in all clinical situations. eGFR's  persistently <60 mL/min signify possible Chronic Kidney Disease.   Georgiann Hahn gap 05/29/2017 7  5 - 15 Final   Performed at [facility], [address] [place], [place] [address]  . Magnesium 05/29/2017 1.3* 1.7 - 2.4 mg/dL Final   Performed at [facility], [address] [place], [place] [address]    Assessment:  EMMRY HINSCH is a 72 y.o. female with progressive stage IV ovarian cancer.  She presented with abdominal discomfort and bloating.  Omental biopsy on 02/23/2015 revealed metastatic high grade serous carcinoma, consistent with gynecologic origin.   She was initially diagnosed with clinical stage IIIC (T3cN1Mx).  CA125 was 707 on 02/17/2015.  She received 4 cycles of neoadjuvant carboplatin and Taxol (03/05/2015 - 05/22/2015).  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  She had loose stools on oral magnesium.  She was initially on Neurontin then switched Lyrica with cycle #3.  Cycle #4 was notable for neutropenia (ANC 300) requiring GCSF x 3 days.    She underwent exploratory laparotomy, lysis of adhesions, total abdominal hysterectomy with bilateral salpingo-oophorectomy, infracolic omentectomy, optimal tumor debulking(< 1 cm), recto-sigmoid resection with creation of end colostomy, cholecystectomy, mobilization of splenic flexure and liver with diaphragmatic stripping on 06/15/2015. The right diaphragm was cleared of tumor. During dissection, the diaphragm was entered and closed with sutures.  She received tamoxifen from 02/25/2016 - 03/14/2016.  She received 4 cycles of carboplatin and gemcitabine (03/21/2016 - 05/24/2016) with GCSF/Neulasta support.  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  Cymbalta began on 02/24/2017  PDL-1 testing revealed a combined score was 5 (>=1 positive).     [facility] genetic testing negative for APC, ATM, BMPR1A, BRCA1, BRCA2, BRIP1, CDH1, CDK4, CDKN2A, CHEK2, EPCAM, MLH1, MSH2, MSH6, MUTYH, NBN, PALB2, PMS2, PTEN,  RAD51C, RAD51D, SMAD4, STK11, TP53. Sequencing for select regions of POLE and POLD1, and large rearrangement analysis for select regions of GREM1 were negative.  She received 2 cycles of Doxil (06/21/2016 - 07/19/2016) with Neulasta support.  She received 4 cycles of topotecan with Neulasta support (08/22/2016 - 10/10/2016).    She received 3 cycles of pembrolizumab (Keytruda) from 12/06/2016 -  01/09/2017.  Cycle #1 was complicated by neutropenia (ANC 400) requiring GCSF/Granix x 3 days.  She received GCSF x 5 days with cycle #2.  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 Right lower chest pleural metastasis and several of the peritoneal tumor implants had increased.  There was new right inguinal lymphadenopathy.  Some of the peritoneal tumor implants had decreased.  The small dependent left pleural effusion was slightly increased .  The mild left hydroureteronephrosis was improved.  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   The chronic biliary ductal dilatation was stable .  There was no evidence of bowel obstruction.  CA125 was 802.9 on 03/30/2015, 567.9 on 04/13/2015, 168.8 on 05/15/2015, 85.2 on 07/17/2015, 68.6 on 07/28/2015, 34.5 on 09/17/2015, 20.7 on 11/06/2015, 49 on 01/22/2016, 106.1 on 02/22/2016, 138.8 on 03/07/2016, 220.8 on 03/21/2016, 152.8 on 04/11/2016, 125.6 on 04/18/2016, 76.8 on 05/03/2016, 60.2 on 05/24/2016, 44 on 07/19/2016, 65.8 on 08/17/2016, 53.4 on 09/12/2016, 47.3 on 10/10/2016, 91 on 12/20/2016, 141.1 on  01/09/2017, 463.7 on 02/27/2017, 907.1 on 03/27/2017, 192.1 on 05/22/2017, and 189.1 on 05/24/2017.  She has a history of recurrent right sided pleural effusion.  She underwent thoracentesis of 650 cc on post-operative day 3.  She was admitted to [facility] on 07/01/2015 and 07/07/2015 for recurrent shortness of breath.  She  underwent 2 additional thoracenteses (1.1 L on 07/02/2015 and 850 cc on 07/08/2015).  Cytology was negative x 2.  Bilateral lower extremity duplex on 07/03/2015 was negative.  Echo revealed an EF of 55-60% on 07/08/2015 and 60-65% on 05/27/2016.    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 She was on Lovenox 60 mg twice a day.  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  She has severe rheumatoid arthritis.  Methotrexate and Enbrel were initially on hold.  She has a normocytic anemia.  Work-up on 02/17/2015 and 05/24/2016 revealed a normal ferritin, B12, folate, TSH.  She denies any melena or hematochezia.    She has anemia due to chronic disease. She received 1 unit PRBCs during her admission at [facility]. She denies any melena or hematochezia. She has diabetes and is on an insulin pump.  She has had persistent neutropenia felt secondary to her rheumatoid arthritis.  Folate and MMA were normal.  TSH was 6.13 (high) with a free T4 of 1.17 (0.61-1.12).  She began methotrexate (10 mg a week) and prednisone (5 mg a day) for severe rheumatoid arthritis on 12/17/2015.  She remains on prednisone alone (5 mg a day).  Bone marrow aspirate and biopsy on 06/09/2010 revealed a hypercellular marrow (70%) with no evidence of dysplasia or malignancy.  Flow cytometry was negative.  Cytogenetics were normal (46,XX).  FISH studies were negative for MDS.  Bone marrow aspirate and biopsy on 12/03/2015 revealed a normocellular to mildy hypercellular marrow for age (40%) with left shifted myelopoiesis, non specific dyserythropoiesis and mild megakaryocytic atypia with no increase in blasts.  There were multiple small nonspecific lymphoid aggregates (favor reactive). There was no increase in reticulin.  There was decreased myeloid cells  (37%) with left shifted maturation and 1% atypical myelod blasts.  There was relatively increased monocytic cells (11%), relatively increased lymphoid cells (36%), and relatively increased eosinophils (6%).  Cytogenetics were normal (63, XX).  SNP microarray was normal.  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  She receives IV magnesium weekly.  She has chemotherapy induced anemia.  She has received Procrit (last 11/07/2016).  Labs on 08/22/2016 revealed a normal B12 and folate.  Ferritin was 37, iron saturation 11% and TIBC 281.  Code status is DNR/DNI.  She developed acute LFT elevation on 11/28/2016.  Event was preceded by upper abdominal discomfort.  Hepatitis B core antibody total and hepatitis C antibody were negative.  She is s/p 3 cycles of Abraxane (03/13/2017 - 05/01/2017).  Cycle #1 was truncated after day 1 secondary to side effects and Thanksgiving.  Chest, abdomen, and pelvic CT on 05/22/2017 revealed  significant enlargement of metastatic disease involving the right pleura, low paratracheal nodal chain, omentum, peritoneal surfaces, mesentery, and liver. Tumors extending through the ostomy site into the subcutaneous tissues. There were multiple new metastatic liver and right pleural lesions.  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  There was new sclerotic lesion in the T11 vertebral body, concerning for possible metastatic disease. There was healing anterior nondisplaced rib fractures of the right second through ninth ribs that appeared new.  Symptomatically, she feels "not very good".  She has a stable grade II neuropathy.  Exam is stable.  WBC is 8300 with an [place] of 6900. Hemoglobin is 8.2, hematocrit 25.2, and platelets 262,000.  Magnesium is 1.3 (low).   Plan: 1.   Labs today:  CBC with diff, CMP, Mg. 2.   Review interval CT scans with noted  progression of disease and discrepancy between CA125.  Discuss timing between last 2 scans and possible progression before initiation of Abraxane.  Discuss possibility of progressive disease not reflected in CA125.  Discuss options of 1-2 cycles of Abraxane and f/u scans.  Discuss other chemotherapy options and VERY low chance of response.  Discuss supportive care.  Patient wishes to proceed with further Abraxane. 3.   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 Refer to urology Erlene Quan, MD).  4.   Discuss treatment options with additional chemotherapy. Discuss continuing Abraxane for 1 or 2 additional cycles, however patient is aware that there is a  low probability that further treatment will be effective.  5.   Discuss quality of life and prognosis. In the absence of treatment, prognosis is less than 3 months. Discuss hospice referral.  6.   Discuss code status. Patient confirmed today that she is DNR/DNI.     7.   Magnesium low at 1.3. Will replace with 6 grams of IV magnesium. Patient no longer wants pump.  8.   Labs reviewed. Blood counts stable and adequate for treatment. Proceed with day 1 of cycle #4 Abraxane.  9.   RTC on 01/30, 01/31, and 02/01 for GCSF 10. RTC on 02/01 for labs (BMP, Mg), and +/- Mg 11. RTC on 02/04 for MD assessment, labs (CBC with diff, CMP, Mg), day 8 of cycle #4 Abraxane, and +/- Mg 12. RTC on 02/06, 02/07, 02/08 for GCSF 13. RTC on 02/08 for labs (BMP, Mg) and +/- Mg 14. RTC on 02/11 for MD assessment, labs (CBC with diff, CMP, Mg), day 15 of cycle #4 Abraxane, and +/- Mg   Honor Loh, NP  05/29/2017, 10:33 AM   I saw and evaluated the patient, participating in the key portions of the service and reviewing pertinent diagnostic studies and records.  I reviewed the nurse practitioner's note and agree with the findings and the plan.  The assessment and plan were discussed with  the patient.  Numerous questions were asked by the patient and answered.   Nolon Stalls, MD 05/29/2017,10:33 AM

## 2017-05-29 NOTE — Progress Notes (Signed)
Pt in today for follow up and scan results.  Husband and children with pt in clinic today.  Pt reports increased shortness of breath on minimal exertion and unable to lie down comfortably because of pressure of abdomen on lungs.  Pt states BP has been low at times today 143/66 sitting and 123/69 standing.  HR 97 and pulse ox on room air 96%.

## 2017-05-30 LAB — CA 125, SERUM (SERIAL): Cancer Antigen (CA) 125: 191.2 U/mL — ABNORMAL HIGH (ref 0.0–38.1)

## 2017-05-31 ENCOUNTER — Inpatient Hospital Stay: Payer: Medicare Other

## 2017-05-31 DIAGNOSIS — C561 Malignant neoplasm of right ovary: Secondary | ICD-10-CM | POA: Diagnosis not present

## 2017-05-31 MED ORDER — TBO-FILGRASTIM 300 MCG/0.5ML ~~LOC~~ SOSY
300.0000 ug | PREFILLED_SYRINGE | Freq: Once | SUBCUTANEOUS | Status: AC
  Administered 2017-05-31: 300 ug via SUBCUTANEOUS
  Filled 2017-05-31: qty 0.5

## 2017-06-01 ENCOUNTER — Inpatient Hospital Stay: Payer: Medicare Other

## 2017-06-01 DIAGNOSIS — C561 Malignant neoplasm of right ovary: Secondary | ICD-10-CM | POA: Diagnosis not present

## 2017-06-01 MED ORDER — TBO-FILGRASTIM 300 MCG/0.5ML ~~LOC~~ SOSY
300.0000 ug | PREFILLED_SYRINGE | Freq: Once | SUBCUTANEOUS | Status: AC
  Administered 2017-06-01: 300 ug via SUBCUTANEOUS

## 2017-06-02 ENCOUNTER — Inpatient Hospital Stay: Payer: Medicare Other | Attending: Hematology and Oncology

## 2017-06-02 ENCOUNTER — Other Ambulatory Visit: Payer: Self-pay | Admitting: Urgent Care

## 2017-06-02 ENCOUNTER — Other Ambulatory Visit: Payer: Self-pay | Admitting: *Deleted

## 2017-06-02 ENCOUNTER — Inpatient Hospital Stay: Payer: Medicare Other

## 2017-06-02 DIAGNOSIS — R110 Nausea: Secondary | ICD-10-CM | POA: Diagnosis not present

## 2017-06-02 DIAGNOSIS — Z86718 Personal history of other venous thrombosis and embolism: Secondary | ICD-10-CM | POA: Insufficient documentation

## 2017-06-02 DIAGNOSIS — K5900 Constipation, unspecified: Secondary | ICD-10-CM | POA: Insufficient documentation

## 2017-06-02 DIAGNOSIS — C561 Malignant neoplasm of right ovary: Secondary | ICD-10-CM

## 2017-06-02 DIAGNOSIS — E785 Hyperlipidemia, unspecified: Secondary | ICD-10-CM | POA: Insufficient documentation

## 2017-06-02 DIAGNOSIS — R5383 Other fatigue: Secondary | ICD-10-CM | POA: Diagnosis not present

## 2017-06-02 DIAGNOSIS — Z801 Family history of malignant neoplasm of trachea, bronchus and lung: Secondary | ICD-10-CM | POA: Insufficient documentation

## 2017-06-02 DIAGNOSIS — N131 Hydronephrosis with ureteral stricture, not elsewhere classified: Secondary | ICD-10-CM | POA: Diagnosis not present

## 2017-06-02 DIAGNOSIS — R0602 Shortness of breath: Secondary | ICD-10-CM | POA: Insufficient documentation

## 2017-06-02 DIAGNOSIS — K219 Gastro-esophageal reflux disease without esophagitis: Secondary | ICD-10-CM | POA: Insufficient documentation

## 2017-06-02 DIAGNOSIS — Z79899 Other long term (current) drug therapy: Secondary | ICD-10-CM | POA: Diagnosis not present

## 2017-06-02 DIAGNOSIS — C786 Secondary malignant neoplasm of retroperitoneum and peritoneum: Secondary | ICD-10-CM | POA: Insufficient documentation

## 2017-06-02 DIAGNOSIS — M899 Disorder of bone, unspecified: Secondary | ICD-10-CM | POA: Insufficient documentation

## 2017-06-02 DIAGNOSIS — E876 Hypokalemia: Secondary | ICD-10-CM | POA: Insufficient documentation

## 2017-06-02 DIAGNOSIS — C800 Disseminated malignant neoplasm, unspecified: Secondary | ICD-10-CM

## 2017-06-02 DIAGNOSIS — I252 Old myocardial infarction: Secondary | ICD-10-CM | POA: Insufficient documentation

## 2017-06-02 DIAGNOSIS — Z7689 Persons encountering health services in other specified circumstances: Secondary | ICD-10-CM | POA: Insufficient documentation

## 2017-06-02 DIAGNOSIS — Z90722 Acquired absence of ovaries, bilateral: Secondary | ICD-10-CM | POA: Insufficient documentation

## 2017-06-02 DIAGNOSIS — C569 Malignant neoplasm of unspecified ovary: Secondary | ICD-10-CM

## 2017-06-02 DIAGNOSIS — I509 Heart failure, unspecified: Secondary | ICD-10-CM | POA: Diagnosis not present

## 2017-06-02 DIAGNOSIS — Z5111 Encounter for antineoplastic chemotherapy: Secondary | ICD-10-CM | POA: Diagnosis not present

## 2017-06-02 DIAGNOSIS — C787 Secondary malignant neoplasm of liver and intrahepatic bile duct: Secondary | ICD-10-CM | POA: Diagnosis not present

## 2017-06-02 DIAGNOSIS — K8020 Calculus of gallbladder without cholecystitis without obstruction: Secondary | ICD-10-CM | POA: Diagnosis not present

## 2017-06-02 DIAGNOSIS — D6481 Anemia due to antineoplastic chemotherapy: Secondary | ICD-10-CM | POA: Insufficient documentation

## 2017-06-02 DIAGNOSIS — M069 Rheumatoid arthritis, unspecified: Secondary | ICD-10-CM | POA: Diagnosis not present

## 2017-06-02 DIAGNOSIS — I2510 Atherosclerotic heart disease of native coronary artery without angina pectoris: Secondary | ICD-10-CM | POA: Diagnosis not present

## 2017-06-02 DIAGNOSIS — Z8673 Personal history of transient ischemic attack (TIA), and cerebral infarction without residual deficits: Secondary | ICD-10-CM | POA: Insufficient documentation

## 2017-06-02 DIAGNOSIS — R918 Other nonspecific abnormal finding of lung field: Secondary | ICD-10-CM | POA: Diagnosis not present

## 2017-06-02 DIAGNOSIS — E109 Type 1 diabetes mellitus without complications: Secondary | ICD-10-CM | POA: Diagnosis not present

## 2017-06-02 DIAGNOSIS — I10 Essential (primary) hypertension: Secondary | ICD-10-CM | POA: Diagnosis not present

## 2017-06-02 DIAGNOSIS — Z87891 Personal history of nicotine dependence: Secondary | ICD-10-CM | POA: Insufficient documentation

## 2017-06-02 DIAGNOSIS — Z90710 Acquired absence of both cervix and uterus: Secondary | ICD-10-CM | POA: Insufficient documentation

## 2017-06-02 LAB — MAGNESIUM: Magnesium: 1.3 mg/dL — ABNORMAL LOW (ref 1.7–2.4)

## 2017-06-02 MED ORDER — MAGNESIUM SULFATE 50 % IJ SOLN
6.0000 g | Freq: Once | INTRAMUSCULAR | Status: AC
  Administered 2017-06-02: 6 g via INTRAVENOUS
  Filled 2017-06-02: qty 10

## 2017-06-02 MED ORDER — SODIUM CHLORIDE 0.9 % IV SOLN
Freq: Once | INTRAVENOUS | Status: AC
  Administered 2017-06-02: 09:00:00 via INTRAVENOUS
  Filled 2017-06-02: qty 1000

## 2017-06-02 MED ORDER — TBO-FILGRASTIM 300 MCG/0.5ML ~~LOC~~ SOSY
300.0000 ug | PREFILLED_SYRINGE | Freq: Once | SUBCUTANEOUS | Status: AC
  Administered 2017-06-02: 300 ug via SUBCUTANEOUS
  Filled 2017-06-02: qty 0.5

## 2017-06-02 MED ORDER — HEPARIN SOD (PORK) LOCK FLUSH 100 UNIT/ML IV SOLN
500.0000 [IU] | Freq: Once | INTRAVENOUS | Status: AC
  Administered 2017-06-02: 500 [IU] via INTRAVENOUS

## 2017-06-04 ENCOUNTER — Encounter: Payer: Self-pay | Admitting: Hematology and Oncology

## 2017-06-05 ENCOUNTER — Inpatient Hospital Stay: Payer: Medicare Other

## 2017-06-05 ENCOUNTER — Inpatient Hospital Stay: Payer: Medicare Other | Admitting: Hematology and Oncology

## 2017-06-05 ENCOUNTER — Encounter: Payer: Self-pay | Admitting: Hematology and Oncology

## 2017-06-05 VITALS — BP 132/58 | HR 68 | Temp 97.9°F | Resp 16 | Wt 132.0 lb

## 2017-06-05 DIAGNOSIS — K8020 Calculus of gallbladder without cholecystitis without obstruction: Secondary | ICD-10-CM

## 2017-06-05 DIAGNOSIS — M069 Rheumatoid arthritis, unspecified: Secondary | ICD-10-CM

## 2017-06-05 DIAGNOSIS — R110 Nausea: Secondary | ICD-10-CM | POA: Diagnosis not present

## 2017-06-05 DIAGNOSIS — E876 Hypokalemia: Secondary | ICD-10-CM

## 2017-06-05 DIAGNOSIS — C561 Malignant neoplasm of right ovary: Secondary | ICD-10-CM | POA: Diagnosis not present

## 2017-06-05 DIAGNOSIS — Z79899 Other long term (current) drug therapy: Secondary | ICD-10-CM

## 2017-06-05 DIAGNOSIS — R5383 Other fatigue: Secondary | ICD-10-CM | POA: Diagnosis not present

## 2017-06-05 DIAGNOSIS — E109 Type 1 diabetes mellitus without complications: Secondary | ICD-10-CM

## 2017-06-05 DIAGNOSIS — Z87891 Personal history of nicotine dependence: Secondary | ICD-10-CM

## 2017-06-05 DIAGNOSIS — C801 Malignant (primary) neoplasm, unspecified: Secondary | ICD-10-CM

## 2017-06-05 DIAGNOSIS — Z7189 Other specified counseling: Secondary | ICD-10-CM

## 2017-06-05 DIAGNOSIS — Z90710 Acquired absence of both cervix and uterus: Secondary | ICD-10-CM

## 2017-06-05 DIAGNOSIS — D6481 Anemia due to antineoplastic chemotherapy: Secondary | ICD-10-CM | POA: Diagnosis not present

## 2017-06-05 DIAGNOSIS — C787 Secondary malignant neoplasm of liver and intrahepatic bile duct: Secondary | ICD-10-CM

## 2017-06-05 DIAGNOSIS — I509 Heart failure, unspecified: Secondary | ICD-10-CM

## 2017-06-05 DIAGNOSIS — R918 Other nonspecific abnormal finding of lung field: Secondary | ICD-10-CM

## 2017-06-05 DIAGNOSIS — I252 Old myocardial infarction: Secondary | ICD-10-CM

## 2017-06-05 DIAGNOSIS — K219 Gastro-esophageal reflux disease without esophagitis: Secondary | ICD-10-CM

## 2017-06-05 DIAGNOSIS — Z5111 Encounter for antineoplastic chemotherapy: Secondary | ICD-10-CM

## 2017-06-05 DIAGNOSIS — Z8673 Personal history of transient ischemic attack (TIA), and cerebral infarction without residual deficits: Secondary | ICD-10-CM

## 2017-06-05 DIAGNOSIS — C786 Secondary malignant neoplasm of retroperitoneum and peritoneum: Secondary | ICD-10-CM

## 2017-06-05 DIAGNOSIS — I10 Essential (primary) hypertension: Secondary | ICD-10-CM

## 2017-06-05 DIAGNOSIS — Z801 Family history of malignant neoplasm of trachea, bronchus and lung: Secondary | ICD-10-CM

## 2017-06-05 DIAGNOSIS — R0602 Shortness of breath: Secondary | ICD-10-CM

## 2017-06-05 DIAGNOSIS — E785 Hyperlipidemia, unspecified: Secondary | ICD-10-CM

## 2017-06-05 DIAGNOSIS — Z86718 Personal history of other venous thrombosis and embolism: Secondary | ICD-10-CM

## 2017-06-05 DIAGNOSIS — M899 Disorder of bone, unspecified: Secondary | ICD-10-CM

## 2017-06-05 DIAGNOSIS — I2510 Atherosclerotic heart disease of native coronary artery without angina pectoris: Secondary | ICD-10-CM | POA: Diagnosis not present

## 2017-06-05 DIAGNOSIS — D649 Anemia, unspecified: Secondary | ICD-10-CM

## 2017-06-05 DIAGNOSIS — C569 Malignant neoplasm of unspecified ovary: Secondary | ICD-10-CM

## 2017-06-05 DIAGNOSIS — C800 Disseminated malignant neoplasm, unspecified: Principal | ICD-10-CM

## 2017-06-05 DIAGNOSIS — Z90722 Acquired absence of ovaries, bilateral: Secondary | ICD-10-CM

## 2017-06-05 LAB — CBC WITH DIFFERENTIAL/PLATELET
Basophils Absolute: 0.1 10*3/uL (ref 0–0.1)
Basophils Relative: 1 %
Eosinophils Absolute: 0 10*3/uL (ref 0–0.7)
Eosinophils Relative: 1 %
HCT: 24.1 % — ABNORMAL LOW (ref 35.0–47.0)
Hemoglobin: 7.8 g/dL — ABNORMAL LOW (ref 12.0–16.0)
Lymphocytes Relative: 16 %
Lymphs Abs: 0.9 10*3/uL — ABNORMAL LOW (ref 1.0–3.6)
MCH: 29.6 pg (ref 26.0–34.0)
MCHC: 32.4 g/dL (ref 32.0–36.0)
MCV: 91.2 fL (ref 80.0–100.0)
Monocytes Absolute: 0.7 10*3/uL (ref 0.2–0.9)
Monocytes Relative: 12 %
Neutro Abs: 4.1 10*3/uL (ref 1.4–6.5)
Neutrophils Relative %: 70 %
Platelets: 288 10*3/uL (ref 150–440)
RBC: 2.64 MIL/uL — ABNORMAL LOW (ref 3.80–5.20)
RDW: 20.3 % — ABNORMAL HIGH (ref 11.5–14.5)
WBC: 5.9 10*3/uL (ref 3.6–11.0)

## 2017-06-05 LAB — COMPREHENSIVE METABOLIC PANEL
ALT: 10 U/L — ABNORMAL LOW (ref 14–54)
AST: 18 U/L (ref 15–41)
Albumin: 3.3 g/dL — ABNORMAL LOW (ref 3.5–5.0)
Alkaline Phosphatase: 107 U/L (ref 38–126)
Anion gap: 10 (ref 5–15)
BUN: 18 mg/dL (ref 6–20)
CO2: 25 mmol/L (ref 22–32)
Calcium: 9.4 mg/dL (ref 8.9–10.3)
Chloride: 101 mmol/L (ref 101–111)
Creatinine, Ser: 0.65 mg/dL (ref 0.44–1.00)
GFR calc Af Amer: >60 mL/min (ref >60)
GFR calc non Af Amer: >60 mL/min (ref >60)
Glucose, Bld: 211 mg/dL — ABNORMAL HIGH (ref 65–99)
Potassium: 3.9 mmol/L (ref 3.5–5.1)
Sodium: 136 mmol/L (ref 135–145)
Total Bilirubin: 0.6 mg/dL (ref 0.3–1.2)
Total Protein: 6.6 g/dL (ref 6.5–8.1)

## 2017-06-05 LAB — MAGNESIUM: Magnesium: 1.3 mg/dL — ABNORMAL LOW (ref 1.7–2.4)

## 2017-06-05 MED ORDER — EMPTY CONTAINERS FLEXIBLE MISC
80.0000 mg/m2 | Freq: Once | Status: DC
  Filled 2017-06-05: qty 25

## 2017-06-05 MED ORDER — SODIUM CHLORIDE 0.9 % IV SOLN
Freq: Once | INTRAVENOUS | Status: AC
  Administered 2017-06-05: 10:00:00 via INTRAVENOUS
  Filled 2017-06-05: qty 1000

## 2017-06-05 MED ORDER — HEPARIN SOD (PORK) LOCK FLUSH 100 UNIT/ML IV SOLN
500.0000 [IU] | Freq: Once | INTRAVENOUS | Status: AC
  Administered 2017-06-05: 500 [IU] via INTRAVENOUS

## 2017-06-05 MED ORDER — MAGNESIUM SULFATE 50 % IJ SOLN
6.0000 g | Freq: Once | INTRAMUSCULAR | Status: AC
  Administered 2017-06-05: 6 g via INTRAVENOUS
  Filled 2017-06-05: qty 10

## 2017-06-05 MED ORDER — DIPHENHYDRAMINE HCL 25 MG PO CAPS
25.0000 mg | ORAL_CAPSULE | Freq: Once | ORAL | Status: AC
  Administered 2017-06-05: 25 mg via ORAL
  Filled 2017-06-05: qty 1

## 2017-06-05 MED ORDER — ONDANSETRON HCL 4 MG PO TABS
8.0000 mg | ORAL_TABLET | Freq: Once | ORAL | Status: AC
  Administered 2017-06-05: 8 mg via ORAL
  Filled 2017-06-05: qty 2

## 2017-06-05 MED ORDER — HEPARIN SOD (PORK) LOCK FLUSH 100 UNIT/ML IV SOLN
500.0000 [IU] | Freq: Every day | INTRAVENOUS | Status: DC | PRN
  Filled 2017-06-05: qty 5

## 2017-06-05 MED ORDER — PACLITAXEL PROTEIN-BOUND CHEMO INJECTION 100 MG
80.0000 mg/m2 | Freq: Once | INTRAVENOUS | Status: AC
  Administered 2017-06-05: 125 mg via INTRAVENOUS
  Filled 2017-06-05: qty 25

## 2017-06-05 MED ORDER — ACETAMINOPHEN 325 MG PO TABS
650.0000 mg | ORAL_TABLET | Freq: Once | ORAL | Status: AC
  Administered 2017-06-05: 650 mg via ORAL
  Filled 2017-06-05: qty 2

## 2017-06-05 NOTE — Progress Notes (Signed)
Hgb <8, continue with treatment per md.  Patient gettting blood today

## 2017-06-05 NOTE — Progress Notes (Signed)
[facility] day:  06/05/2017   Chief Complaint: [name] is a 72 y.o. female with stage IV ovarian cancer who is seen for review of interval scans and assessment prior to day 8 of cycle #4 Abraxane.  HPI:  The patient was last seen in the medical oncology clinic on 05/29/2017.  At that time, she felt more short winded.  She had a stable grade II neuropathy.  Exam was stable.  WBC was 8300 with an Concepcion of 6900. Hemoglobin was 8.2, hematocrit 25.2, and platelets 262,000.  Magnesium was 1.3 (low).  There was a discrepancy between scans and CA125.  Because of limited options, decision was made to proceed with 2 more cycles of Abraxane.  She received day 8 chemotherapy.  She received 6 gm of IV magnesium.  She received 3 days of GCSF (01/30 - 06/02/2017).  During the interim, patient notes that things have been "pretty normal".  Patient experienced nausea. The prescribed antiemetics have been effective. Patient complains of continued shortness of breath. She is noticing that this symptom is happening more at night as well. Patient notes that she has been more fatigued. She is sleeping more than normal during the day. Patient feels like her abdomen is "more round, bloated, and firm".   Patient denies any B symptoms. She is eating well, however her weight has decreased 3 pounds. Patient denies pain in the clinic today.    Past Medical History:  Diagnosis Date  . CAD (coronary artery disease)    a. 09/2012 Cath: LM nl, LAD 95p, 66m LCX 914mOM2 50, RCA 100.  . Marland KitchenHF (congestive heart failure) ([facility]  . Cholelithiasis   . Collagen vascular disease (HCC)    Rheumatoid Arthritis.  . Esophageal stricture   . Exertional shortness of breath   . GERD (gastroesophageal reflux disease)   . H/O hiatal hernia   . Herniated disc   . History of pancytopenia   . Hyperlipidemia   . Hypertension   . Hypokalemia   . Leukopenia 2012   s/p bone marrow biopsy, Dr.  PaMa Hillock. NSTEMI (non-ST elevated myocardial infarction) ([facility]   "mild" (10/18/2012)  . Ovarian cancer ([facility]   chemo and hysterectomy  . Pneumonia 2013; 08/2012   "one lung; double" (10/18/2012)  . Rheumatoid arthritis(714.0)   . Type I diabetes mellitus ([facility]   "dx'd in 1957" (10/18/2012)    Past Surgical History:  Procedure Laterality Date  . ABDOMINAL HYSTERECTOMY  06/15/2015   Dx L/S, EXLAP TAH BSO omentectomy RSRx colostomy diaphragm resection stripping  . CARDIAC CATHETERIZATION  10/18/2012   "first one was today" (10/18/2012)  . CATARACT EXTRACTION W/ INTRAOCULAR LENS IMPLANT Right 2010  . CHOLECYSTECTOMY  06/15/2015   combined case with ovarian cancer debulking  . COLON SURGERY    . CORONARY ARTERY BYPASS GRAFT N/A 10/19/2012   Procedure: CORONARY ARTERY BYPASS GRAFTING (CABG);  Surgeon: StMelrose NakayamaMD;  [place]: [facility] Service: Open Heart Surgery;  Laterality: N/A;  . ESOPHAGEAL DILATION     "3 or 4 times" (10/19/2011)  . ESOPHAGOGASTRODUODENOSCOPY  2012   Dr. IfMinna Merritts. OSTOMY    . OVARY SURGERY     removal  . PERIPHERAL VASCULAR CATHETERIZATION N/A 03/02/2015   Procedure: PoGlori Luisath Insertion;  Surgeon: JaAlgernon HuxleyMD;  Location: [facility];  Service: Cardiovascular;  Laterality: N/A;  . [place]  Family History  Problem Relation Age of Onset  . Diabetes Mother   . Arthritis Mother   . Diabetes Father   . Arthritis Father   . Aneurysm Sister        neck  . Cancer Sister        lung    Social History:  reports that she quit smoking about 23 years ago. Her smoking use included cigarettes. She has a 30.00 pack-year smoking history. she has never used smokeless tobacco. She reports that she does not drink alcohol or use drugs.  Patient is a retired Art therapist. Patient denies exposure to radiation and toxins. She spends every Thursday afternoon with her sister.  Her son is [name] and daughter is [name].  She is  accompanied by her husband today.  Allergies:  Allergies  Allergen Reactions  . Codeine Nausea And Vomiting  . Latex Rash    Current Medications: Current Outpatient Medications  Medication Sig Dispense Refill  . Calcium-Vitamin D 600-200 MG-UNIT tablet Take 1 tablet by mouth 2 (two) times daily.     . cholecalciferol (VITAMIN D) 1000 units tablet Take 1,000 Units by mouth daily.    . DULoxetine (CYMBALTA) 20 MG capsule Take 1 capsule (20 mg total) by mouth daily. 90 capsule 2  . enoxaparin (LOVENOX) 60 MG/0.6ML injection INJECT 0.6 ML (60 MG TOTAL) INTO THE SKIN EVERY 12 HOURS 60 mL 3  . folic acid (FOLVITE) 1 MG tablet Take 1 tablet (1 mg total) by mouth daily. 90 tablet 3  . furosemide (LASIX) 40 MG tablet Take 1 tablet (40 mg total) by mouth daily as needed for edema. 30 tablet 1  . insulin lispro (HUMALOG) 100 UNIT/ML injection Inject 0.06 mLs (6 Units total) into the skin daily. (Patient taking differently: Inject 0-85 Units into the skin daily. Via insulin pump) 30 mL 3  . lidocaine-prilocaine (EMLA) cream APPLY 1 APPLICATION TOPICALLY AS NEEDED 1 HOUR PRIOR TO TREATMENT. COVER WITH PRESS N SEAL UNTIL TREATMENT TIME AS DIRECTED 30 g 1  . loratadine (CLARITIN) 10 MG tablet Take 1 tablet by mouth daily as needed.    . magnesium oxide (MAG-OX) 400 (241.3 Mg) MG tablet TAKE 1 TABLET BY MOUTH ONCE DAILY 30 tablet 1  . metoprolol succinate (TOPROL-XL) 12.5 mg TB24 24 hr tablet Take 12.5 mg by mouth 2 (two) times daily.    . metoprolol tartrate (LOPRESSOR) 25 MG tablet Take 0.5 tablets (12.5 mg total) by mouth 2 (two) times daily. 30 tablet 0  . ondansetron (ZOFRAN) 8 MG tablet TAKE 1 TABLET BY MOUTH EVERY 8 HOURS AS NEEDED FOR NAUSEA OR VOMITING 30 tablet 1  . pantoprazole (PROTONIX) 40 MG tablet TAKE 1 TABLET BY MOUTH TWICE (2) DAILY 180 tablet 1  . potassium chloride SA (K-DUR,KLOR-CON) 20 MEQ tablet TAKE 1 TABLET BY MOUTH ONCE A DAY 90 tablet 1  . predniSONE (DELTASONE) 5 MG tablet Take  5 mg by mouth daily with breakfast.    . vitamin C (ASCORBIC ACID) 500 MG tablet Take 500 mg by mouth daily.    Marland Kitchen zinc gluconate 50 MG tablet Take 50 mg daily by mouth.     No current facility-administered medications for this visit.    Facility-Administered Medications Ordered in Other Visits  Medication Dose Route Frequency Provider Last Rate Last Dose  . 0.9 %  sodium chloride infusion   Intravenous Continuous Lequita Asal, MD   Stopped at 04/24/17 1140  . magnesium sulfate IVPB 4 g 100  mL  4 g Intravenous Once Faythe Casa E, NP      . sodium chloride 0.9 % injection 10 mL  10 mL Intracatheter PRN Corcoran, Melissa C, MD      . sodium chloride 0.9 % injection 10 mL  10 mL Intravenous PRN Lequita Asal, MD   10 mL at 04/13/15 4008    Review of Systems:  GENERAL:  Feels "normal".  Fatigue.  No fever, chills or sweats.  Weight down 3 pounds. PERFORMANCE STATUS (ECOG):  1 HEENT:  No visual changes, sore throat, mouth sores or tenderness. Lungs:  Shortness of breath on exertion.  No cough.  No hemoptysis. Cardiac:  No chest pain, palpitations, orthopnea, or PND. GI:  Abdomen tighter.  No nausea, vomiting, diarrhea, melena or hematochezia. GU:  No urgency, frequency, dysuria, or hematuria. Musculoskeletal:  Back pain.  Severe rheumatoid arthritis (on prednisone).  Osteoporosis.  No back pain. No muscle tenderness. Extremities:  No pain or swelling. Skin:  No increased bruising or bleeding.  No rashes or skin changes. Neuro:  Neuropathy in hands and feet (no change).  No headache, numbness or weakness, balance or coordination issues. Endocrine:  Diabetes on an insulin pump.  Blood sugar elevated on steroids.  Occasional hot flashes.  Psych:  No mood changes, depression or anxiety.  Sleeping more. Pain:  No focal pain.  Review of systems:  All other systems reviewed and found to be negative.  Physical Exam:  Blood pressure (!) 132/58, pulse 68, temperature 97.9 F (36.6  C), temperature source Tympanic, resp. rate 16, weight 132 lb (59.9 kg). GENERAL:  Well developed, well nourished woman sitting comfortably in the exam room in no acute distress.  She has a cane at her side. MENTAL STATUS:  Alert and oriented to person, place and time. HEAD:  Wearing a light silver blue cap.  Alopecia.  Normocephalic, atraumatic, face symmetric, no Cushingoid features. EYES:  Gold rimmed glasses.  Blue eyes.  Right scleral hemorrhage (small).  Pupils equal round and reactive to light and accomodation.  No conjunctivitis or scleral icterus.  ENT: Oropharynx clear without lesion. Tongue normal. Mucous membranes moist.  RESPIRATORY: Decreased breath sounds right base.  No rales, wheezes or rhonchi. CARDIOVASCULAR: Regular rate and rhythm without murmur, rub or gallop. ABDOMEN: Non-distended.  Soft, non-tender with active bowel sounds and no appreciable hepatosplenomegaly. Questionable upper left side of abdomen nodularity.  No masses.  Insulin pump.  Ostomy bag. SKIN: Pale.  No rashes or ulcers. EXTREMITIES:  No edema, skin discoloration or tenderness. No palpable cords. LYMPH NODES: No palpable cervical, supraclavicular, axillary or inguinal adenopathy  NEUROLOGICAL: Appropriate. PSYCH: Appropriate.   Radiology studies: 02/13/2015:  Abdomen and pelvic CT revealed bilateral mass-like adnexal regions (right adnexal mass 5.6 x 5.0 cm and the left adnexa mass 10.3 x 6.0 x 9.9 cm).  There was a large amount of soft tissue throughout the peritoneal cavity involving the omentum and other peritoneal surfaces.  There was a small volume ascites. There was a peripheral 3.3 x 1.9 cm low-attenuation lesion overlying the right lobe of the liver, likely representing a serosal implant. There was 1.4 x 2.2 cm ill-defined peripheral lesion within the inferior aspect of segment 6 adjacent to the ampulla in the duodenum.  04/28/2015:  Abdominal and pelvic CT revealed decreasing bilateral  ovarian masses.   The left adnexal mass measured 4.0 x 6.1 cm (previously 5.0 x 8.5 cm). The right ovary measured 3.8 x 4.9 cm (previously 4.7 x 5.6  cm).  There was improved peritoneal carcinomatosis.  There was a small amount of ascites.  The hepatic dome lesion was stable. The previously seen right hepatic lobe lesion was not well-visualized.  There was a small left pleural effusion and trace right pleural effusion.  The nodular lesion at the ampulla of Vater, extending into the duodenum was stable.  There was no biliary ductal dilatation. 08/01/2015:  Rght upper extremity ultrasound revealed a near occlusive thrombus within the central portion of the right internal jugular vein and central portion of the right subclavian vein. 09/01/2015:  Chest, abdomen, and pelvic CT revealed continued decrease in perihepatic fluid collection contiguous with the right pleural space with percutaneous drain.  09/11/2015:  Chest, abdomen, and pelvic CT revealed a residual versus recurrent fluid collection posterior to the right hepatic lobe (2.6 x 1.1 x 4.0 cm).   10/13/2015:  Chest, abdomen, and pelvic CT revealed resolution of the empyema. 02/15/2016:  Chest, abdomen, and pelvic CT revealed multiple soft tissue nodules throughout the pelvis, small bowel mesenteric and peritoneum and, concerning for peritoneal metastatic disease.  There was soft tissue irregularity within the left upper quadrant.  There was mild right hydronephrosis (etiology unclear).  There was interval resolution of previously described right pleural based fluid and gas collection. There was a pleural based nodule within the right lower hemi-thorax concerning for pleural based metastasis.  There were small bilateral pleural effusions.  There was pulmonary nodularity, predominately within the left upper lobe (metastatic or infectious/inflammatory etiology).  There was slightly increased mediastinal adenopathy (infectious/inflammatory or metastatic).   There was a low attenuation lesion within the left hepatic lobe (complicated fluid within the fissure or metastatic disease). 03/08/2016:  Abdomen and pelvic CT revealed mild progression of peritoneal disease in the abdomen.  There was interval increase in loculated fluid around the lateral segment left liver, stomach, and spleen.  There was persistent soft tissue lesion at the level of the ampulla with mild intra and extrahepatic biliary duct dilatation.  There was persistent intrahepatic and capsular metastatic disease involving the liver. 05/26/2016:  Head MRI revealed multiple small areas of acute infarct involving the occipital parietal lobe bilaterally and the left lateral cerebellum,  consistent with posterior circulation emboli.  06/10/2016:  Adomen and pelvic CT revealed progression of peritoneal carcinomatosis predominantly in the perihepatic space, bilateral lower quadrants and pelvis.  There was worsening bilateral obstructive uropathy due to malignant involvement of the pelvic ureters.  There was stable small pleural/subpleural nodules at the right lung base.  There was stable small left pleural effusion.  There was decreased small volume perihepatic ascites.  08/09/2016:  Abdomen and pelvic CT revealed interval increase and loculated fluid collections within the peritoneal space consistent with progression of peritoneal ovarian carcinoma metastasis.  There was one large 7 cm collection in the RIGHT lower quadrant which had increased significantly in size and may represent of point of small bowel obstruction.  The proximal stomach and duodenum were decompressed. The mid small bowel was mildly dilated. There was concern for obstruction given the large intraperitoneal fluid collection.  There was increase in subcapsular hepatic metastatic fluid collections.  There was enlargement of rounded ampullary lesion in the second portion duodenum concerning for metastatic lesion.  There was mild biliary duct  dilatation (stable).  There was nodular pleural metastasis in the RIGHT lower lobe pleural space. 11/03/2016:  Abdomen and pelvic CT revealed multifocal cystic metastases in the abdomen/pelvis, reflecting peritoneal disease.  The dominant lesion in the left anterior  abdomen has progressed, while additional lesions were mixed.  Peritoneal disease along the liver and spleen progressed.  Pleural-based metastases along the posterior right hemithorax progressed. There were trace left pleural effusion.  There was a stable 1.7 cm duodenal lesion at the ampulla. 11/29/2016:  Abdomen and pelvic CT revealed mild interval enlargement of pleural metastasis the RIGHT lower lobe.  There was overall mild decrease in cystic peritoneal metastasis within the abdomen pelvis. The solid lesion at the terminal ileum was decreased in size. Subcapsular lesion in the liver was slightly decreased in size.  There was no evidence of disease progression the abdomen pelvis.  There was mild intrahepatic duct dilatation similar prior.  The ampullary lesion was slightly larger.  There was mild hydronephrosis and hydroureter on the LEFT likely related to cystic metastasis at the LEFT vesicoureteral junction 12/09/2016:  MRCP revealed the cystic lesion within the pancreatic head/uncinate process had decreased in size and demonstrated no suspicious characteristics. This can be presumed a pseudocyst or focus of side branch duct ectasia.  There was similar to slight progression of extensive peritoneal metastasis since 11/29/2016. Grossly similar abdominal nodal and right pleural metastasis.  There was chronic mild common duct dilatation with similar soft tissue fullness about the ampulla compared to 11/29/2016.  There was improved to resolved left-sided hydronephrosis. 01/20/2017:  Abdomen and pelvic CT revealed complex mixed interval changes in widespread metastatic disease in the peritoneal cavity, lower chest and thoracic, abdominal and pelvic  lymph nodes. Right lower chest pleural metastasis and several of the peritoneal tumor implants had increased.  There was new right inguinal lymphadenopathy.  Some of the peritoneal tumor implants had decreased.  The small dependent left pleural effusion was slightly increased .  The mild left hydroureteronephrosis was improved.  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   The chronic biliary ductal dilatation was stable .  There was no evidence of bowel obstruction. 05/22/2017:  Chest, abdomen, and pelvic CT revealed  significant enlargement of metastatic disease involving the right pleura, low paratracheal nodal chain, omentum, peritoneal surfaces, mesentery, and liver. Tumors extending through the ostomy site into the subcutaneous tissues. There were multiple new metastatic liver and right pleural lesions.  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  There was new sclerotic lesion in the T11 vertebral body, concerning for possible metastatic disease. There was healing anterior nondisplaced rib fractures of the right second through ninth ribs that appeared new.   Admissions: [facility] from 06/15/2015 - 06/22/2015.  She underwent exploratory laparotomy, lysis of adhesions, total abdominal hysterectomy with bilateral salpingo-oophorectomy, infracolic omentectomy, optimal tumor debulking(< 1 cm), recto-sigmoid resection with creation of end colostomy, cholecystectomy, mobilization of splenic flexure and liver with diaphragmatic stripping on 06/15/2015. The right diaphragm was cleared of tumor. During dissection, the diaphragm was entered and closed with sutures.  [facility] from 07/01/2015 - 07/03/2015 with pleural effusion. [place] from 07/07/2015 - 07/09/2015 with recurrent pleural effusion. [facility] from 07/29/2015 - 08/10/2015 with a right-sided empyema and liver abscess. She underwent CT-guided  placement of a liver abscess drain on 07/30/2015.  Liver abscess culture grew out group B strep and Enterobacter which was sensitive to Zosyn. She was transitioned to ertapenem Colbert Ewing) prior to discharge.  She was readmitted to [facility] on 09/17/2015. 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  Head MRI on 05/26/2016 revealed multiple small areas of acute infarct  involving the occipital parietal lobe bilaterally and the left lateral cerebellum,  consistent with posterior circulation emboli.  Work-up included a negative carotid ultrasound and echocardiogram. [facility] from 08/10/2016 - 08/14/2016 with a small bowel obstruction.  She was managed conservatively. [place] from 09/27/2016 - 09/28/2016 with symptomatic anemia and diarrhea.   Appointment on 06/05/2017  Component Date Value Ref Range Status  . Magnesium 06/05/2017 1.3* 1.7 - 2.4 mg/dL Final   Performed at [facility], [address] [place], [place] [address]  . WBC 06/05/2017 5.9  3.6 - 11.0 K/uL Final  . RBC 06/05/2017 2.64* 3.80 - 5.20 MIL/uL Final  . Hemoglobin 06/05/2017 7.8* 12.0 - 16.0 g/dL Final  . HCT 06/05/2017 24.1* 35.0 - 47.0 % Final  . MCV 06/05/2017 91.2  80.0 - 100.0 fL Final  . MCH 06/05/2017 29.6  26.0 - 34.0 pg Final  . MCHC 06/05/2017 32.4  32.0 - 36.0 g/dL Final  . RDW 06/05/2017 20.3* 11.5 - 14.5 % Final  . Platelets 06/05/2017 288  150 - 440 K/uL Final  . Neutrophils Relative % 06/05/2017 70  % Final  . Neutro Abs 06/05/2017 4.1  1.4 - 6.5 K/uL Final  . Lymphocytes Relative 06/05/2017 16  % Final  . Lymphs Abs 06/05/2017 0.9* 1.0 - 3.6 K/uL Final  . Monocytes Relative 06/05/2017 12  % Final  . Monocytes Absolute 06/05/2017 0.7  0.2 - 0.9 K/uL Final  . Eosinophils Relative 06/05/2017 1  % Final  . Eosinophils Absolute 06/05/2017 0.0  0 - 0.7 K/uL Final  . Basophils Relative 06/05/2017 1  % Final  . Basophils Absolute 06/05/2017 0.1  0 - 0.1 K/uL Final   Performed at  [facility] [address] [place], [place] [address]  . Sodium 06/05/2017 136  135 - 145 mmol/L Final  . Potassium 06/05/2017 3.9  3.5 - 5.1 mmol/L Final  . Chloride 06/05/2017 101  101 - 111 mmol/L Final  . CO2 06/05/2017 25  22 - 32 mmol/L Final  . Glucose, Bld 06/05/2017 211* 65 - 99 mg/dL Final  . BUN 06/05/2017 18  6 - 20 mg/dL Final  . Creatinine, Ser 06/05/2017 0.65  0.44 - 1.00 mg/dL Final  . Calcium 06/05/2017 9.4  8.9 - 10.3 mg/dL Final  . Total Protein 06/05/2017 6.6  6.5 - 8.1 g/dL Final  . Albumin 06/05/2017 3.3* 3.5 - 5.0 g/dL Final  . AST 06/05/2017 18  15 - 41 U/L Final  . ALT 06/05/2017 10* 14 - 54 U/L Final  . Alkaline Phosphatase 06/05/2017 107  38 - 126 U/L Final  . Total Bilirubin 06/05/2017 0.6  0.3 - 1.2 mg/dL Final  . GFR calc non Af Amer 06/05/2017 >60  >60 mL/min Final  . GFR calc Af Amer 06/05/2017 >60  >60 mL/min Final   Comment: (NOTE) The eGFR has been calculated using the CKD EPI equation. This calculation has not been validated in all clinical situations. eGFR's persistently <60 mL/min signify possible Chronic Kidney Disease.   Georgiann Hahn gap 06/05/2017 10  5 - 15 Final   Performed at [facility], [address] [place], [place] [address]    Assessment:  [name] is a 72 y.o. female with progressive stage IV ovarian cancer.  She presented with abdominal discomfort and bloating.  Omental biopsy on 02/23/2015 revealed metastatic high grade serous carcinoma, consistent with gynecologic origin.   She was initially diagnosed with clinical stage IIIC (T3cN1Mx).  CA125 was 707 on  02/17/2015.  She received 4 cycles of neoadjuvant carboplatin and Taxol (03/05/2015 - 05/22/2015).  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  She had loose stools on oral magnesium.  She was initially on Neurontin then switched Lyrica with cycle #3.  Cycle #4 was notable for neutropenia (ANC 300) requiring GCSF x 3 days.    She underwent exploratory  laparotomy, lysis of adhesions, total abdominal hysterectomy with bilateral salpingo-oophorectomy, infracolic omentectomy, optimal tumor debulking(< 1 cm), recto-sigmoid resection with creation of end colostomy, cholecystectomy, mobilization of splenic flexure and liver with diaphragmatic stripping on 06/15/2015. The right diaphragm was cleared of tumor. During dissection, the diaphragm was entered and closed with sutures.  She received tamoxifen from 02/25/2016 - 03/14/2016.  She received 4 cycles of carboplatin and gemcitabine (03/21/2016 - 05/24/2016) with GCSF/Neulasta support.  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  Cymbalta began on 02/24/2017  PDL-1 testing revealed a combined score was 5 (>=1 positive).     [facility] genetic testing negative for APC, ATM, BMPR1A, BRCA1, BRCA2, BRIP1, CDH1, CDK4, CDKN2A, CHEK2, EPCAM, MLH1, MSH2, MSH6, MUTYH, NBN, PALB2, PMS2, PTEN, RAD51C, RAD51D, SMAD4, STK11, TP53. Sequencing for select regions of POLE and POLD1, and large rearrangement analysis for select regions of GREM1 were negative.  She received 2 cycles of Doxil (06/21/2016 - 07/19/2016) with Neulasta support.  She received 4 cycles of topotecan with Neulasta support (08/22/2016 - 10/10/2016).    She received 3 cycles of pembrolizumab (Keytruda) from 12/06/2016 -  01/09/2017.  Cycle #1 was complicated by neutropenia (ANC 400) requiring GCSF/Granix x 3 days.  She received GCSF x 5 days with cycle #2.  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 Right lower chest pleural metastasis and several of the peritoneal tumor implants had increased.  There was new right inguinal lymphadenopathy.  Some of the peritoneal tumor implants had decreased.  The small dependent left pleural effusion was slightly increased .  The mild left  hydroureteronephrosis was improved.  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   The chronic biliary ductal dilatation was stable .  There was no evidence of bowel obstruction.  CA125 was 802.9 on 03/30/2015, 567.9 on 04/13/2015, 168.8 on 05/15/2015, 85.2 on 07/17/2015, 68.6 on 07/28/2015, 34.5 on 09/17/2015, 20.7 on 11/06/2015, 49 on 01/22/2016, 106.1 on 02/22/2016, 138.8 on 03/07/2016, 220.8 on 03/21/2016, 152.8 on 04/11/2016, 125.6 on 04/18/2016, 76.8 on 05/03/2016, 60.2 on 05/24/2016, 44 on 07/19/2016, 65.8 on 08/17/2016, 53.4 on 09/12/2016, 47.3 on 10/10/2016, 91 on 12/20/2016, 141.1 on 01/09/2017, 463.7 on 02/27/2017, 907.1 on 03/27/2017, 192.1 on 05/22/2017, and 189.1 on 05/24/2017.  She has a history of recurrent right sided pleural effusion.  She underwent thoracentesis of 650 cc on post-operative day 3.  She was admitted to [facility] on 07/01/2015 and 07/07/2015 for recurrent shortness of breath.  She underwent 2 additional thoracenteses (1.1 L on 07/02/2015 and 850 cc on 07/08/2015).  Cytology was negative x 2.  Bilateral lower extremity duplex on 07/03/2015 was negative.  Echo revealed an EF of 55-60% on 07/08/2015 and 60-65% on 05/27/2016.    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 She was on Lovenox 60 mg twice a day.  She switched to Eliquis on 04/16/2016 then returned back to Lovenox  after her CVA.  She has severe rheumatoid arthritis.  Methotrexate and Enbrel were initially on hold.  She has a normocytic anemia.  Work-up on 02/17/2015 and 05/24/2016 revealed a normal ferritin, B12, folate, TSH.  She denies any melena or hematochezia.    She has anemia due to chronic disease. She received 1 unit PRBCs during her admission at [facility]. She denies any melena or hematochezia. She has diabetes and is on an insulin pump.  She has had persistent  neutropenia felt secondary to her rheumatoid arthritis.  Folate and MMA were normal.  TSH was 6.13 (high) with a free T4 of 1.17 (0.61-1.12).  She began methotrexate (10 mg a week) and prednisone (5 mg a day) for severe rheumatoid arthritis on 12/17/2015.  She remains on prednisone alone (5 mg a day).  Bone marrow aspirate and biopsy on 06/09/2010 revealed a hypercellular marrow (70%) with no evidence of dysplasia or malignancy.  Flow cytometry was negative.  Cytogenetics were normal (46,XX).  FISH studies were negative for MDS.  Bone marrow aspirate and biopsy on 12/03/2015 revealed a normocellular to mildy hypercellular marrow for age (40%) with left shifted myelopoiesis, non specific dyserythropoiesis and mild megakaryocytic atypia with no increase in blasts.  There were multiple small nonspecific lymphoid aggregates (favor reactive). There was no increase in reticulin.  There was decreased myeloid cells (37%) with left shifted maturation and 1% atypical myelod blasts.  There was relatively increased monocytic cells (11%), relatively increased lymphoid cells (36%), and relatively increased eosinophils (6%).  Cytogenetics were normal (38, XX).  SNP microarray was normal.  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  She receives IV magnesium weekly.  She has chemotherapy induced anemia.  She has received Procrit (last 11/07/2016).  Labs on 08/22/2016 revealed a normal B12 and folate.  Ferritin was 37, iron saturation 11% and TIBC 281.  Code status is DNR/DNI.  She developed acute LFT elevation on 11/28/2016.  Event was preceded by upper abdominal discomfort.  Hepatitis B core antibody total and hepatitis C antibody were negative.  She is on day 8 of cycle #4 Abraxane (03/13/2017 - 05/29/2017).  Cycle #1 was truncated after day 1 secondary to side effects and Thanksgiving.  Chest, abdomen, and pelvic CT on 05/22/2017 revealed  significant enlargement of metastatic disease involving the  right pleura, low paratracheal nodal chain, omentum, peritoneal surfaces, mesentery, and liver. Tumors extending through the ostomy site into the subcutaneous tissues. There were multiple new metastatic liver and right pleural lesions.  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  There was new sclerotic lesion in the T11 vertebral body, concerning for possible metastatic disease. There was healing anterior nondisplaced rib fractures of the right second through ninth ribs that appeared new.  Symptomatically, she feels "tired".  She has a stable grade II neuropathy.  Exam is stable.  WBC is 5900 with an [place] of 4100. Hemoglobin is 7.8, hematocrit 24.1, and platelets 288,000.  Magnesium is 1.3 (low).   Plan: 1.   Labs today:  CBC with diff, CMP, Mg. 2.   Magnesium low at 1.3. Will replace with 6 grams of IV magnesium. Patient no longer wants pump.  3.   Discuss anemia.  Patient is symptomatic (short of breath and fatigue).  Anemia work-up.  Suspect chemotherapy induced anemia.  Will transfuse 1 unit of PRBCs. 4.   Labs reviewed. Blood counts stable and adequate  enough for treatment. Will proceed with day 8 of cycle #4 Abraxane. 5.   RTC on 02/06, 02/07, 02/08 for GCSF. 6.   RTC on 02/08 for labs (ferritin, iron studies, B12, folate, BMP, Mg) and +/- Mg. 7.   RTC on 02/11 for MD assessment, labs (CBC with diff, CMP, Mg), day 15 of cycle #4 Abraxane, and +/- Mg.   Honor Loh, NP  06/05/2017, 9:44 AM   I saw and evaluated the patient, participating in the key portions of the service and reviewing pertinent diagnostic studies and records.  I reviewed the nurse practitioner's note and agree with the findings and the plan.  The assessment and plan were discussed with the patient.  Numerous questions were asked by the patient and answered.   Nolon Stalls, MD 06/05/2017,9:44 AM

## 2017-06-06 ENCOUNTER — Other Ambulatory Visit: Payer: Self-pay | Admitting: Hematology and Oncology

## 2017-06-06 LAB — TYPE AND SCREEN
ABO/RH(D): A POS
Antibody Screen: NEGATIVE
Unit division: 0

## 2017-06-06 LAB — BPAM RBC
Blood Product Expiration Date: 201902152359
ISSUE DATE / TIME: 201902041426
Unit Type and Rh: 6200

## 2017-06-06 LAB — PREPARE RBC (CROSSMATCH)

## 2017-06-07 ENCOUNTER — Inpatient Hospital Stay: Payer: Medicare Other

## 2017-06-07 DIAGNOSIS — C561 Malignant neoplasm of right ovary: Secondary | ICD-10-CM | POA: Diagnosis not present

## 2017-06-07 MED ORDER — TBO-FILGRASTIM 300 MCG/0.5ML ~~LOC~~ SOSY
300.0000 ug | PREFILLED_SYRINGE | Freq: Once | SUBCUTANEOUS | Status: AC
  Administered 2017-06-07: 300 ug via SUBCUTANEOUS

## 2017-06-08 ENCOUNTER — Inpatient Hospital Stay: Payer: Medicare Other

## 2017-06-08 DIAGNOSIS — C561 Malignant neoplasm of right ovary: Secondary | ICD-10-CM | POA: Diagnosis not present

## 2017-06-08 MED ORDER — TBO-FILGRASTIM 300 MCG/0.5ML ~~LOC~~ SOSY
300.0000 ug | PREFILLED_SYRINGE | Freq: Once | SUBCUTANEOUS | Status: AC
  Administered 2017-06-08: 300 ug via SUBCUTANEOUS
  Filled 2017-06-08: qty 0.5

## 2017-06-09 ENCOUNTER — Inpatient Hospital Stay: Payer: Medicare Other

## 2017-06-09 DIAGNOSIS — C561 Malignant neoplasm of right ovary: Secondary | ICD-10-CM

## 2017-06-09 DIAGNOSIS — C800 Disseminated malignant neoplasm, unspecified: Secondary | ICD-10-CM

## 2017-06-09 DIAGNOSIS — D649 Anemia, unspecified: Secondary | ICD-10-CM

## 2017-06-09 DIAGNOSIS — Z95828 Presence of other vascular implants and grafts: Secondary | ICD-10-CM

## 2017-06-09 LAB — IRON AND TIBC
Iron: 65 ug/dL (ref 28–170)
Saturation Ratios: 21 % (ref 10.4–31.8)
TIBC: 307 ug/dL (ref 250–450)
UIBC: 242 ug/dL

## 2017-06-09 LAB — BASIC METABOLIC PANEL
Anion gap: 7 (ref 5–15)
BUN: 17 mg/dL (ref 6–20)
CO2: 28 mmol/L (ref 22–32)
Calcium: 9 mg/dL (ref 8.9–10.3)
Chloride: 101 mmol/L (ref 101–111)
Creatinine, Ser: 0.77 mg/dL (ref 0.44–1.00)
GFR calc Af Amer: >60 mL/min (ref >60)
GFR calc non Af Amer: >60 mL/min (ref >60)
Glucose, Bld: 134 mg/dL — ABNORMAL HIGH (ref 65–99)
Potassium: 4.1 mmol/L (ref 3.5–5.1)
Sodium: 136 mmol/L (ref 135–145)

## 2017-06-09 LAB — FOLATE: Folate: 60.6 ng/mL (ref >5.9)

## 2017-06-09 LAB — MAGNESIUM: Magnesium: 1.3 mg/dL — ABNORMAL LOW (ref 1.7–2.4)

## 2017-06-09 LAB — VITAMIN B12: Vitamin B-12: 830 pg/mL (ref 180–914)

## 2017-06-09 LAB — FERRITIN: Ferritin: 201 ng/mL (ref 11–307)

## 2017-06-09 MED ORDER — SODIUM CHLORIDE 0.9 % IV SOLN
Freq: Once | INTRAVENOUS | Status: AC
Start: 2017-06-09 — End: 2017-06-09
  Administered 2017-06-09: 10:00:00 via INTRAVENOUS
  Filled 2017-06-09: qty 1000

## 2017-06-09 MED ORDER — HEPARIN SOD (PORK) LOCK FLUSH 100 UNIT/ML IV SOLN
500.0000 [IU] | Freq: Once | INTRAVENOUS | Status: AC
  Administered 2017-06-09: 500 [IU] via INTRAVENOUS
  Filled 2017-06-09: qty 5

## 2017-06-09 MED ORDER — MAGNESIUM SULFATE 50 % IJ SOLN
6.0000 g | Freq: Once | INTRAMUSCULAR | Status: AC
  Administered 2017-06-09: 6 g via INTRAVENOUS
  Filled 2017-06-09: qty 10

## 2017-06-09 MED ORDER — TBO-FILGRASTIM 300 MCG/0.5ML ~~LOC~~ SOSY
300.0000 ug | PREFILLED_SYRINGE | Freq: Once | SUBCUTANEOUS | Status: AC
  Administered 2017-06-09: 300 ug via SUBCUTANEOUS
  Filled 2017-06-09: qty 0.5

## 2017-06-12 ENCOUNTER — Other Ambulatory Visit: Payer: Self-pay | Admitting: Hematology and Oncology

## 2017-06-12 ENCOUNTER — Inpatient Hospital Stay: Payer: Medicare Other | Admitting: Hematology and Oncology

## 2017-06-12 ENCOUNTER — Encounter: Payer: Self-pay | Admitting: Hematology and Oncology

## 2017-06-12 ENCOUNTER — Inpatient Hospital Stay: Payer: Medicare Other

## 2017-06-12 ENCOUNTER — Other Ambulatory Visit: Payer: Self-pay

## 2017-06-12 VITALS — BP 124/62 | HR 92 | Temp 97.8°F | Wt 131.1 lb

## 2017-06-12 DIAGNOSIS — R110 Nausea: Secondary | ICD-10-CM | POA: Diagnosis not present

## 2017-06-12 DIAGNOSIS — I2510 Atherosclerotic heart disease of native coronary artery without angina pectoris: Secondary | ICD-10-CM

## 2017-06-12 DIAGNOSIS — E785 Hyperlipidemia, unspecified: Secondary | ICD-10-CM

## 2017-06-12 DIAGNOSIS — I509 Heart failure, unspecified: Secondary | ICD-10-CM | POA: Diagnosis not present

## 2017-06-12 DIAGNOSIS — C569 Malignant neoplasm of unspecified ovary: Secondary | ICD-10-CM

## 2017-06-12 DIAGNOSIS — R0602 Shortness of breath: Secondary | ICD-10-CM

## 2017-06-12 DIAGNOSIS — K219 Gastro-esophageal reflux disease without esophagitis: Secondary | ICD-10-CM

## 2017-06-12 DIAGNOSIS — C801 Malignant (primary) neoplasm, unspecified: Secondary | ICD-10-CM

## 2017-06-12 DIAGNOSIS — Z5111 Encounter for antineoplastic chemotherapy: Secondary | ICD-10-CM | POA: Diagnosis not present

## 2017-06-12 DIAGNOSIS — Z87891 Personal history of nicotine dependence: Secondary | ICD-10-CM

## 2017-06-12 DIAGNOSIS — Z90722 Acquired absence of ovaries, bilateral: Secondary | ICD-10-CM

## 2017-06-12 DIAGNOSIS — Z801 Family history of malignant neoplasm of trachea, bronchus and lung: Secondary | ICD-10-CM

## 2017-06-12 DIAGNOSIS — C787 Secondary malignant neoplasm of liver and intrahepatic bile duct: Secondary | ICD-10-CM

## 2017-06-12 DIAGNOSIS — D6481 Anemia due to antineoplastic chemotherapy: Secondary | ICD-10-CM

## 2017-06-12 DIAGNOSIS — Z79899 Other long term (current) drug therapy: Secondary | ICD-10-CM | POA: Diagnosis not present

## 2017-06-12 DIAGNOSIS — C800 Disseminated malignant neoplasm, unspecified: Principal | ICD-10-CM

## 2017-06-12 DIAGNOSIS — M069 Rheumatoid arthritis, unspecified: Secondary | ICD-10-CM

## 2017-06-12 DIAGNOSIS — Z8673 Personal history of transient ischemic attack (TIA), and cerebral infarction without residual deficits: Secondary | ICD-10-CM

## 2017-06-12 DIAGNOSIS — Z7189 Other specified counseling: Secondary | ICD-10-CM

## 2017-06-12 DIAGNOSIS — M899 Disorder of bone, unspecified: Secondary | ICD-10-CM

## 2017-06-12 DIAGNOSIS — Z90710 Acquired absence of both cervix and uterus: Secondary | ICD-10-CM

## 2017-06-12 DIAGNOSIS — R5383 Other fatigue: Secondary | ICD-10-CM

## 2017-06-12 DIAGNOSIS — G620 Drug-induced polyneuropathy: Secondary | ICD-10-CM

## 2017-06-12 DIAGNOSIS — R918 Other nonspecific abnormal finding of lung field: Secondary | ICD-10-CM

## 2017-06-12 DIAGNOSIS — E109 Type 1 diabetes mellitus without complications: Secondary | ICD-10-CM

## 2017-06-12 DIAGNOSIS — C786 Secondary malignant neoplasm of retroperitoneum and peritoneum: Secondary | ICD-10-CM

## 2017-06-12 DIAGNOSIS — E876 Hypokalemia: Secondary | ICD-10-CM

## 2017-06-12 DIAGNOSIS — C561 Malignant neoplasm of right ovary: Secondary | ICD-10-CM

## 2017-06-12 DIAGNOSIS — I252 Old myocardial infarction: Secondary | ICD-10-CM

## 2017-06-12 DIAGNOSIS — I10 Essential (primary) hypertension: Secondary | ICD-10-CM

## 2017-06-12 DIAGNOSIS — K8020 Calculus of gallbladder without cholecystitis without obstruction: Secondary | ICD-10-CM

## 2017-06-12 DIAGNOSIS — T451X5A Adverse effect of antineoplastic and immunosuppressive drugs, initial encounter: Secondary | ICD-10-CM

## 2017-06-12 DIAGNOSIS — Z86718 Personal history of other venous thrombosis and embolism: Secondary | ICD-10-CM

## 2017-06-12 LAB — CBC WITH DIFFERENTIAL/PLATELET
Basophils Absolute: 0.1 10*3/uL (ref 0–0.1)
Basophils Relative: 1 %
Eosinophils Absolute: 0 10*3/uL (ref 0–0.7)
Eosinophils Relative: 1 %
HCT: 25.9 % — ABNORMAL LOW (ref 35.0–47.0)
Hemoglobin: 8.7 g/dL — ABNORMAL LOW (ref 12.0–16.0)
Lymphocytes Relative: 14 %
Lymphs Abs: 0.8 10*3/uL — ABNORMAL LOW (ref 1.0–3.6)
MCH: 30.3 pg (ref 26.0–34.0)
MCHC: 33.6 g/dL (ref 32.0–36.0)
MCV: 90.3 fL (ref 80.0–100.0)
Monocytes Absolute: 0.7 10*3/uL (ref 0.2–0.9)
Monocytes Relative: 13 %
Neutro Abs: 4 10*3/uL (ref 1.4–6.5)
Neutrophils Relative %: 71 %
Platelets: 319 10*3/uL (ref 150–440)
RBC: 2.87 MIL/uL — ABNORMAL LOW (ref 3.80–5.20)
RDW: 19.6 % — ABNORMAL HIGH (ref 11.5–14.5)
WBC: 5.6 10*3/uL (ref 3.6–11.0)

## 2017-06-12 LAB — COMPREHENSIVE METABOLIC PANEL
ALT: 12 U/L — ABNORMAL LOW (ref 14–54)
AST: 18 U/L (ref 15–41)
Albumin: 3.4 g/dL — ABNORMAL LOW (ref 3.5–5.0)
Alkaline Phosphatase: 116 U/L (ref 38–126)
Anion gap: 3 — ABNORMAL LOW (ref 5–15)
BUN: 18 mg/dL (ref 6–20)
CO2: 28 mmol/L (ref 22–32)
Calcium: 9 mg/dL (ref 8.9–10.3)
Chloride: 104 mmol/L (ref 101–111)
Creatinine, Ser: 0.71 mg/dL (ref 0.44–1.00)
GFR calc Af Amer: >60 mL/min (ref >60)
GFR calc non Af Amer: >60 mL/min (ref >60)
Glucose, Bld: 123 mg/dL — ABNORMAL HIGH (ref 65–99)
Potassium: 3.6 mmol/L (ref 3.5–5.1)
Sodium: 135 mmol/L (ref 135–145)
Total Bilirubin: 0.7 mg/dL (ref 0.3–1.2)
Total Protein: 6.6 g/dL (ref 6.5–8.1)

## 2017-06-12 LAB — MAGNESIUM: Magnesium: 1.4 mg/dL — ABNORMAL LOW (ref 1.7–2.4)

## 2017-06-12 MED ORDER — SODIUM CHLORIDE 0.9 % IV SOLN
6.0000 g | Freq: Once | INTRAVENOUS | Status: AC
  Administered 2017-06-12: 6 g via INTRAVENOUS
  Filled 2017-06-12: qty 10

## 2017-06-12 MED ORDER — HEPARIN SOD (PORK) LOCK FLUSH 100 UNIT/ML IV SOLN
500.0000 [IU] | Freq: Once | INTRAVENOUS | Status: AC | PRN
  Administered 2017-06-12: 500 [IU]
  Filled 2017-06-12: qty 5

## 2017-06-12 MED ORDER — PACLITAXEL PROTEIN-BOUND CHEMO INJECTION 100 MG
80.0000 mg/m2 | Freq: Once | INTRAVENOUS | Status: AC
  Administered 2017-06-12: 125 mg via INTRAVENOUS
  Filled 2017-06-12: qty 25

## 2017-06-12 MED ORDER — PEGFILGRASTIM 6 MG/0.6ML ~~LOC~~ PSKT
6.0000 mg | PREFILLED_SYRINGE | Freq: Once | SUBCUTANEOUS | Status: AC
  Administered 2017-06-12: 6 mg via SUBCUTANEOUS
  Filled 2017-06-12: qty 0.6

## 2017-06-12 MED ORDER — ONDANSETRON HCL 4 MG PO TABS
8.0000 mg | ORAL_TABLET | Freq: Once | ORAL | Status: AC
  Administered 2017-06-12: 8 mg via ORAL
  Filled 2017-06-12: qty 2

## 2017-06-12 MED ORDER — SODIUM CHLORIDE 0.9 % IV SOLN
Freq: Once | INTRAVENOUS | Status: AC
  Administered 2017-06-12: 11:00:00 via INTRAVENOUS
  Filled 2017-06-12: qty 1000

## 2017-06-12 MED ORDER — EMPTY CONTAINERS FLEXIBLE MISC
80.0000 mg/m2 | Freq: Once | Status: DC
  Filled 2017-06-12: qty 25

## 2017-06-12 MED ORDER — SODIUM CHLORIDE 0.9% FLUSH
10.0000 mL | INTRAVENOUS | Status: DC | PRN
  Filled 2017-06-12: qty 10

## 2017-06-12 NOTE — Progress Notes (Signed)
[facility] day:  06/12/2017   Chief Complaint: [name] is a 72 y.o. female with stage IV ovarian cancer who is seen for review of interval scans and assessment prior to day 15 of cycle #4 Abraxane.  HPI:  The patient was last seen in the medical oncology clinic on 06/05/2017.  At that time, she felt "tired".  She had a stable grade II neuropathy.  Exam was stable.  WBC was 5900 with an Mulga of 4100. Hemoglobin was 7.8, hematocrit 24.1, and platelets 288,000.  Magnesium was 1.3 (low).  She received 1 unit of PRBCs.  She received magnesium and day 8 Abraxane.    During the interim, patient states, "I'm pretty good today". Patient continues to note chronic fatigue. She has "tightness" in her abdomen. Patient advises that her breathing is "not at full capacity". Patient notes that she "cannot take a deep breath". She has intermittent nausea that is controlled with the prescribed interventions. She denies vomiting and changes in her bowel habits. Following her transfusion, patient felt "so good". She states, "When I got finished, I hopped up out of that chair and headed for the elevator without my cane".   Patient is scheduled to see Dr. Erlene Quan later this week.  She is eating well overall.  Her weight is down 1 pound. She denies pain in the clinic today.    Past Medical History:  Diagnosis Date  . CAD (coronary artery disease)    a. 09/2012 Cath: LM nl, LAD 95p, 33m LCX 978mOM2 50, RCA 100.  . Marland KitchenHF (congestive heart failure) ([facility]  . Cholelithiasis   . Collagen vascular disease (HCC)    Rheumatoid Arthritis.  . Esophageal stricture   . Exertional shortness of breath   . GERD (gastroesophageal reflux disease)   . H/O hiatal hernia   . Herniated disc   . History of pancytopenia   . Hyperlipidemia   . Hypertension   . Hypokalemia   . Leukopenia 2012   s/p bone marrow biopsy, Dr. PaMa Hillock. NSTEMI (non-ST elevated myocardial infarction)  ([place]5/2014   "mild" (10/18/2012)  . Ovarian cancer ([facility]   chemo and hysterectomy  . Pneumonia 2013; 08/2012   "one lung; double" (10/18/2012)  . Rheumatoid arthritis(714.0)   . Type I diabetes mellitus ([facility]   "dx'd in 1957" (10/18/2012)    Past Surgical History:  Procedure Laterality Date  . ABDOMINAL HYSTERECTOMY  06/15/2015   Dx L/S, EXLAP TAH BSO omentectomy RSRx colostomy diaphragm resection stripping  . CARDIAC CATHETERIZATION  10/18/2012   "first one was today" (10/18/2012)  . CATARACT EXTRACTION W/ INTRAOCULAR LENS IMPLANT Right 2010  . CHOLECYSTECTOMY  06/15/2015   combined case with ovarian cancer debulking  . COLON SURGERY    . CORONARY ARTERY BYPASS GRAFT N/A 10/19/2012   Procedure: CORONARY ARTERY BYPASS GRAFTING (CABG);  Surgeon: StMelrose NakayamaMD;  [facility]: Open [facility] Surgery;  Laterality: N/A;  . ESOPHAGEAL DILATION     "3 or 4 times" (10/19/2011)  . ESOPHAGOGASTRODUODENOSCOPY  2012   Dr. IfMinna Merritts. OSTOMY    . OVARY SURGERY     removal  . PERIPHERAL VASCULAR CATHETERIZATION N/A 03/02/2015   Procedure: PoGlori Luisath Insertion;  Surgeon: JaAlgernon HuxleyMD;  [facility];  Service: Cardiovascular;  Laterality: N/A;  . TUBAL LIGATION  1970    Family History  Problem Relation Age of Onset  .  Diabetes Mother   . Arthritis Mother   . Diabetes Father   . Arthritis Father   . Aneurysm Sister        neck  . Cancer Sister        lung    Social History:  reports that she quit smoking about 23 years ago. Her smoking use included cigarettes. She has a 30.00 pack-year smoking history. she has never used smokeless tobacco. She reports that she does not drink alcohol or use drugs.  Patient is a retired Art therapist. Patient denies exposure to radiation and toxins. She spends every Thursday afternoon with her sister.  Her son is [name] and daughter is [name].  She is accompanied by her husband today.  Allergies:  Allergies   Allergen Reactions  . Codeine Nausea And Vomiting  . Latex Rash    Current Medications: Current Outpatient Medications  Medication Sig Dispense Refill  . Calcium-Vitamin D 600-200 MG-UNIT tablet Take 1 tablet by mouth 2 (two) times daily.     . cholecalciferol (VITAMIN D) 1000 units tablet Take 1,000 Units by mouth daily.    . DULoxetine (CYMBALTA) 20 MG capsule Take 1 capsule (20 mg total) by mouth daily. 90 capsule 2  . enoxaparin (LOVENOX) 60 MG/0.6ML injection INJECT 0.6 ML (60 MG TOTAL) INTO THE SKIN EVERY 12 HOURS 60 mL 3  . folic acid (FOLVITE) 1 MG tablet Take 1 tablet (1 mg total) by mouth daily. 90 tablet 3  . furosemide (LASIX) 40 MG tablet Take 1 tablet (40 mg total) by mouth daily as needed for edema. 30 tablet 1  . insulin lispro (HUMALOG) 100 UNIT/ML injection Inject 0.06 mLs (6 Units total) into the skin daily. (Patient taking differently: Inject 0-85 Units into the skin daily. Via insulin pump) 30 mL 3  . lidocaine-prilocaine (EMLA) cream APPLY 1 APPLICATION TOPICALLY AS NEEDED 1 HOUR PRIOR TO TREATMENT. COVER WITH PRESS N SEAL UNTIL TREATMENT TIME AS DIRECTED 30 g 1  . loratadine (CLARITIN) 10 MG tablet Take 1 tablet by mouth daily as needed.    . magnesium oxide (MAG-OX) 400 (241.3 Mg) MG tablet TAKE 1 TABLET BY MOUTH ONCE DAILY 30 tablet 1  . metoprolol succinate (TOPROL-XL) 12.5 mg TB24 24 hr tablet Take 12.5 mg by mouth 2 (two) times daily.    . metoprolol tartrate (LOPRESSOR) 25 MG tablet Take 0.5 tablets (12.5 mg total) by mouth 2 (two) times daily. 30 tablet 0  . ondansetron (ZOFRAN) 8 MG tablet TAKE 1 TABLET BY MOUTH EVERY 8 HOURS AS NEEDED FOR NAUSEA AND VOMITING 30 tablet 1  . pantoprazole (PROTONIX) 40 MG tablet TAKE 1 TABLET BY MOUTH TWICE (2) DAILY 180 tablet 1  . potassium chloride SA (K-DUR,KLOR-CON) 20 MEQ tablet TAKE 1 TABLET BY MOUTH ONCE A DAY 90 tablet 1  . predniSONE (DELTASONE) 5 MG tablet Take 5 mg by mouth daily with breakfast.    . vitamin C  (ASCORBIC ACID) 500 MG tablet Take 500 mg by mouth daily.    Marland Kitchen zinc gluconate 50 MG tablet Take 50 mg daily by mouth.     No current facility-administered medications for this visit.    Facility-Administered Medications Ordered in Other Visits  Medication Dose Route Frequency Provider Last Rate Last Dose  . 0.9 %  sodium chloride infusion   Intravenous Continuous Lequita Asal, MD   Stopped at 04/24/17 1140  . magnesium sulfate IVPB 4 g 100 mL  4 g Intravenous Once Jacquelin Hawking, NP      .  sodium chloride 0.9 % injection 10 mL  10 mL Intracatheter PRN Corcoran, Melissa C, MD      . sodium chloride 0.9 % injection 10 mL  10 mL Intravenous PRN Lequita Asal, MD   10 mL at 04/13/15 0626    Review of Systems:  GENERAL:  Feels "pretty good today".  Fatigue.  No fever, chills or sweats.  Weight down 1 pound. PERFORMANCE STATUS (ECOG):  1 HEENT:  No visual changes, sore throat, mouth sores or tenderness. Lungs:  Shortness of breath on exertion.  "Can't take a deep breath".  No cough.  No hemoptysis. Cardiac:  No chest pain, palpitations, orthopnea, or PND. GI:  Abdomen tighter.  No nausea, vomiting, diarrhea, melena or hematochezia. GU:  No urgency, frequency, dysuria, or hematuria. Musculoskeletal:  Back pain.  Severe rheumatoid arthritis (on prednisone).  Osteoporosis.  No back pain. No muscle tenderness. Extremities:  No pain or swelling. Skin:  No increased bruising or bleeding.  No rashes or skin changes. Neuro:  Neuropathy in hands and feet (no change).  No headache, numbness or weakness, balance or coordination issues. Endocrine:  Diabetes on an insulin pump.  Blood sugar elevated on steroids.  Occasional hot flashes.  Psych:  No mood changes, depression or anxiety.  Sleeping more. Pain:  No focal pain.  Review of systems:  All other systems reviewed and found to be negative.  Physical Exam:  Blood pressure 124/62, pulse 92, temperature 97.8 F (36.6 C), temperature  source Tympanic, weight 131 lb 1.6 oz (59.5 kg). GENERAL:  Well developed, well nourished woman sitting comfortably in the exam room in no acute distress. MENTAL STATUS:  Alert and oriented to person, place and time. HEAD:  Wearing a light silver blue cap.  Alopecia.  Normocephalic, atraumatic, face symmetric, no Cushingoid features. EYES:  Gold rimmed glasses.  Blue eyes.  Pupils equal round and reactive to light and accomodation.  No conjunctivitis or scleral icterus.  ENT: Oropharynx clear without lesion. Tongue normal. Mucous membranes moist.  RESPIRATORY: Decreased breath sounds right base.  No rales, wheezes or rhonchi. CARDIOVASCULAR: Regular rate and rhythm without murmur, rub or gallop. ABDOMEN: Soft, non-tender with active bowel sounds and no appreciable hepatosplenomegaly. Small nodule left side of abdomen.  No masses.  Insulin pump.  Ostomy bag. SKIN: No rashes or ulcers. EXTREMITIES:  No edema, skin discoloration or tenderness. No palpable cords. LYMPH NODES: No palpable cervical, supraclavicular, axillary or inguinal adenopathy  NEUROLOGICAL: Appropriate. PSYCH: Appropriate.   Radiology studies: 02/13/2015:  Abdomen and pelvic CT revealed bilateral mass-like adnexal regions (right adnexal mass 5.6 x 5.0 cm and the left adnexa mass 10.3 x 6.0 x 9.9 cm).  There was a large amount of soft tissue throughout the peritoneal cavity involving the omentum and other peritoneal surfaces.  There was a small volume ascites. There was a peripheral 3.3 x 1.9 cm low-attenuation lesion overlying the right lobe of the liver, likely representing a serosal implant. There was 1.4 x 2.2 cm ill-defined peripheral lesion within the inferior aspect of segment 6 adjacent to the ampulla in the duodenum.  04/28/2015:  Abdominal and pelvic CT revealed decreasing bilateral ovarian masses.   The left adnexal mass measured 4.0 x 6.1 cm (previously 5.0 x 8.5 cm). The right ovary measured 3.8 x 4.9 cm  (previously 4.7 x 5.6 cm).  There was improved peritoneal carcinomatosis.  There was a small amount of ascites.  The hepatic dome lesion was stable. The previously seen right hepatic lobe  lesion was not well-visualized.  There was a small left pleural effusion and trace right pleural effusion.  The nodular lesion at the ampulla of Vater, extending into the duodenum was stable.  There was no biliary ductal dilatation. 08/01/2015:  Rght upper extremity ultrasound revealed a near occlusive thrombus within the central portion of the right internal jugular vein and central portion of the right subclavian vein. 09/01/2015:  Chest, abdomen, and pelvic CT revealed continued decrease in perihepatic fluid collection contiguous with the right pleural space with percutaneous drain.  09/11/2015:  Chest, abdomen, and pelvic CT revealed a residual versus recurrent fluid collection posterior to the right hepatic lobe (2.6 x 1.1 x 4.0 cm).   10/13/2015:  Chest, abdomen, and pelvic CT revealed resolution of the empyema. 02/15/2016:  Chest, abdomen, and pelvic CT revealed multiple soft tissue nodules throughout the pelvis, small bowel mesenteric and peritoneum and, concerning for peritoneal metastatic disease.  There was soft tissue irregularity within the left upper quadrant.  There was mild right hydronephrosis (etiology unclear).  There was interval resolution of previously described right pleural based fluid and gas collection. There was a pleural based nodule within the right lower hemi-thorax concerning for pleural based metastasis.  There were small bilateral pleural effusions.  There was pulmonary nodularity, predominately within the left upper lobe (metastatic or infectious/inflammatory etiology).  There was slightly increased mediastinal adenopathy (infectious/inflammatory or metastatic).  There was a low attenuation lesion within the left hepatic lobe (complicated fluid within the fissure or metastatic  disease). 03/08/2016:  Abdomen and pelvic CT revealed mild progression of peritoneal disease in the abdomen.  There was interval increase in loculated fluid around the lateral segment left liver, stomach, and spleen.  There was persistent soft tissue lesion at the level of the ampulla with mild intra and extrahepatic biliary duct dilatation.  There was persistent intrahepatic and capsular metastatic disease involving the liver. 05/26/2016:  Head MRI revealed multiple small areas of acute infarct involving the occipital parietal lobe bilaterally and the left lateral cerebellum,  consistent with posterior circulation emboli.  06/10/2016:  Adomen and pelvic CT revealed progression of peritoneal carcinomatosis predominantly in the perihepatic space, bilateral lower quadrants and pelvis.  There was worsening bilateral obstructive uropathy due to malignant involvement of the pelvic ureters.  There was stable small pleural/subpleural nodules at the right lung base.  There was stable small left pleural effusion.  There was decreased small volume perihepatic ascites.  08/09/2016:  Abdomen and pelvic CT revealed interval increase and loculated fluid collections within the peritoneal space consistent with progression of peritoneal ovarian carcinoma metastasis.  There was one large 7 cm collection in the RIGHT lower quadrant which had increased significantly in size and may represent of point of small bowel obstruction.  The proximal stomach and duodenum were decompressed. The mid small bowel was mildly dilated. There was concern for obstruction given the large intraperitoneal fluid collection.  There was increase in subcapsular hepatic metastatic fluid collections.  There was enlargement of rounded ampullary lesion in the second portion duodenum concerning for metastatic lesion.  There was mild biliary duct dilatation (stable).  There was nodular pleural metastasis in the RIGHT lower lobe pleural space. 11/03/2016:   Abdomen and pelvic CT revealed multifocal cystic metastases in the abdomen/pelvis, reflecting peritoneal disease.  The dominant lesion in the left anterior abdomen has progressed, while additional lesions were mixed.  Peritoneal disease along the liver and spleen progressed.  Pleural-based metastases along the posterior right hemithorax progressed. There were  trace left pleural effusion.  There was a stable 1.7 cm duodenal lesion at the ampulla. 11/29/2016:  Abdomen and pelvic CT revealed mild interval enlargement of pleural metastasis the RIGHT lower lobe.  There was overall mild decrease in cystic peritoneal metastasis within the abdomen pelvis. The solid lesion at the terminal ileum was decreased in size. Subcapsular lesion in the liver was slightly decreased in size.  There was no evidence of disease progression the abdomen pelvis.  There was mild intrahepatic duct dilatation similar prior.  The ampullary lesion was slightly larger.  There was mild hydronephrosis and hydroureter on the LEFT likely related to cystic metastasis at the LEFT vesicoureteral junction 12/09/2016:  MRCP revealed the cystic lesion within the pancreatic head/uncinate process had decreased in size and demonstrated no suspicious characteristics. This can be presumed a pseudocyst or focus of side branch duct ectasia.  There was similar to slight progression of extensive peritoneal metastasis since 11/29/2016. Grossly similar abdominal nodal and right pleural metastasis.  There was chronic mild common duct dilatation with similar soft tissue fullness about the ampulla compared to 11/29/2016.  There was improved to resolved left-sided hydronephrosis. 01/20/2017:  Abdomen and pelvic CT revealed complex mixed interval changes in widespread metastatic disease in the peritoneal cavity, lower chest and thoracic, abdominal and pelvic lymph nodes. Right lower chest pleural metastasis and several of the peritoneal tumor implants had increased.   There was new right inguinal lymphadenopathy.  Some of the peritoneal tumor implants had decreased.  The small dependent left pleural effusion was slightly increased .  The mild left hydroureteronephrosis was improved.  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   The chronic biliary ductal dilatation was stable .  There was no evidence of bowel obstruction. 05/22/2017:  Chest, abdomen, and pelvic CT revealed  significant enlargement of metastatic disease involving the right pleura, low paratracheal nodal chain, omentum, peritoneal surfaces, mesentery, and liver. Tumors extending through the ostomy site into the subcutaneous tissues. There were multiple new metastatic liver and right pleural lesions.  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  There was new sclerotic lesion in the T11 vertebral body, concerning for possible metastatic disease. There was healing anterior nondisplaced rib fractures of the right second through ninth ribs that appeared new.   Admissions: [place] from 06/15/2015 - 06/22/2015.  She underwent exploratory laparotomy, lysis of adhesions, total abdominal hysterectomy with bilateral salpingo-oophorectomy, infracolic omentectomy, optimal tumor debulking(< 1 cm), recto-sigmoid resection with creation of end colostomy, cholecystectomy, mobilization of splenic flexure and liver with diaphragmatic stripping on 06/15/2015. The right diaphragm was cleared of tumor. During dissection, the diaphragm was entered and closed with sutures.  [place] from 07/01/2015 - 07/03/2015 with pleural effusion. [place] from 07/07/2015 - 07/09/2015 with recurrent pleural effusion. [place] from 07/29/2015 - 08/10/2015 with a right-sided empyema and liver abscess. She underwent CT-guided placement of a liver abscess drain on 07/30/2015.  Liver abscess culture grew out group B strep and Enterobacter  which was sensitive to Zosyn. She was transitioned to ertapenem Colbert Ewing) prior to discharge.  She was readmitted to [facility] on 09/17/2015. [place] from 05/26/2016 - 05/28/2016 with altered mental status and an acute embolic CVA.  Head MRI on 05/26/2016 revealed multiple small areas of acute infarct involving the occipital parietal lobe bilaterally and the left lateral cerebellum,  consistent with posterior circulation emboli.  Work-up included a negative carotid ultrasound and echocardiogram. [facility] from  08/10/2016 - 08/14/2016 with a small bowel obstruction.  She was managed conservatively. [place] from 09/27/2016 - 09/28/2016 with symptomatic anemia and diarrhea.   Appointment on 06/12/2017  Component Date Value Ref Range Status  . Magnesium 06/12/2017 1.4* 1.7 - 2.4 mg/dL Final   Performed at [facility], [address] [place], [place] [address]  . Sodium 06/12/2017 135  135 - 145 mmol/L Final  . Potassium 06/12/2017 3.6  3.5 - 5.1 mmol/L Final  . Chloride 06/12/2017 104  101 - 111 mmol/L Final  . CO2 06/12/2017 28  22 - 32 mmol/L Final  . Glucose, Bld 06/12/2017 123* 65 - 99 mg/dL Final  . BUN 06/12/2017 18  6 - 20 mg/dL Final  . Creatinine, Ser 06/12/2017 0.71  0.44 - 1.00 mg/dL Final  . Calcium 06/12/2017 9.0  8.9 - 10.3 mg/dL Final  . Total Protein 06/12/2017 6.6  6.5 - 8.1 g/dL Final  . Albumin 06/12/2017 3.4* 3.5 - 5.0 g/dL Final  . AST 06/12/2017 18  15 - 41 U/L Final  . ALT 06/12/2017 12* 14 - 54 U/L Final  . Alkaline Phosphatase 06/12/2017 116  38 - 126 U/L Final  . Total Bilirubin 06/12/2017 0.7  0.3 - 1.2 mg/dL Final  . GFR calc non Af Amer 06/12/2017 >60  >60 mL/min Final  . GFR calc Af Amer 06/12/2017 >60  >60 mL/min Final   Comment: (NOTE) The eGFR has been calculated using the CKD EPI equation. This calculation has not been validated in all clinical situations. eGFR's persistently <60 mL/min signify possible Chronic Kidney Disease.   Georgiann Hahn gap 06/12/2017 3* 5 - 15  Final   Performed at [facility], [address] [place], [place] [address]  . WBC 06/12/2017 5.6  3.6 - 11.0 K/uL Final  . RBC 06/12/2017 2.87* 3.80 - 5.20 MIL/uL Final  . Hemoglobin 06/12/2017 8.7* 12.0 - 16.0 g/dL Final  . HCT 06/12/2017 25.9* 35.0 - 47.0 % Final  . MCV 06/12/2017 90.3  80.0 - 100.0 fL Final  . MCH 06/12/2017 30.3  26.0 - 34.0 pg Final  . MCHC 06/12/2017 33.6  32.0 - 36.0 g/dL Final  . RDW 06/12/2017 19.6* 11.5 - 14.5 % Final  . Platelets 06/12/2017 319  150 - 440 K/uL Final  . Neutrophils Relative % 06/12/2017 71  % Final  . Neutro Abs 06/12/2017 4.0  1.4 - 6.5 K/uL Final  . Lymphocytes Relative 06/12/2017 14  % Final  . Lymphs Abs 06/12/2017 0.8* 1.0 - 3.6 K/uL Final  . Monocytes Relative 06/12/2017 13  % Final  . Monocytes Absolute 06/12/2017 0.7  0.2 - 0.9 K/uL Final  . Eosinophils Relative 06/12/2017 1  % Final  . Eosinophils Absolute 06/12/2017 0.0  0 - 0.7 K/uL Final  . Basophils Relative 06/12/2017 1  % Final  . Basophils Absolute 06/12/2017 0.1  0 - 0.1 K/uL Final   Performed at [facility], [address], [place], [place] [address]    Assessment:  [name] is a 72 y.o. female with progressive stage IV ovarian cancer.  She presented with abdominal discomfort and bloating.  Omental biopsy on 02/23/2015 revealed metastatic high grade serous carcinoma, consistent with gynecologic origin.   She was initially diagnosed with clinical stage IIIC (T3cN1Mx).  CA125 was 707 on 02/17/2015.  She received 4 cycles of neoadjuvant carboplatin and Taxol (03/05/2015 - 05/22/2015).  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  She had loose stools on  oral magnesium.  She was initially on Neurontin then switched Lyrica with cycle #3.  Cycle #4 was notable for neutropenia (ANC 300) requiring GCSF x 3 days.    She underwent exploratory laparotomy, lysis of adhesions, total abdominal hysterectomy with bilateral salpingo-oophorectomy, infracolic  omentectomy, optimal tumor debulking(< 1 cm), recto-sigmoid resection with creation of end colostomy, cholecystectomy, mobilization of splenic flexure and liver with diaphragmatic stripping on 06/15/2015. The right diaphragm was cleared of tumor. During dissection, the diaphragm was entered and closed with sutures.  She received tamoxifen from 02/25/2016 - 03/14/2016.  She received 4 cycles of carboplatin and gemcitabine (03/21/2016 - 05/24/2016) with GCSF/Neulasta support.  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  Cymbalta began on 02/24/2017  PDL-1 testing revealed a combined score was 5 (>=1 positive).     [facility] genetic testing negative for APC, ATM, BMPR1A, BRCA1, BRCA2, BRIP1, CDH1, CDK4, CDKN2A, CHEK2, EPCAM, MLH1, MSH2, MSH6, MUTYH, NBN, PALB2, PMS2, PTEN, RAD51C, RAD51D, SMAD4, STK11, TP53. Sequencing for select regions of POLE and POLD1, and large rearrangement analysis for select regions of GREM1 were negative.  She received 2 cycles of Doxil (06/21/2016 - 07/19/2016) with Neulasta support.  She received 4 cycles of topotecan with Neulasta support (08/22/2016 - 10/10/2016).    She received 3 cycles of pembrolizumab (Keytruda) from 12/06/2016 -  01/09/2017.  Cycle #1 was complicated by neutropenia (ANC 400) requiring GCSF/Granix x 3 days.  She received GCSF x 5 days with cycle #2.  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 Right lower chest pleural metastasis and several of the peritoneal tumor implants had increased.  There was new right inguinal lymphadenopathy.  Some of the peritoneal tumor implants had decreased.  The small dependent left pleural effusion was slightly increased .  The mild left hydroureteronephrosis was improved.  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   The chronic biliary ductal dilatation was  stable .  There was no evidence of bowel obstruction.  CA125 was 802.9 on 03/30/2015, 567.9 on 04/13/2015, 168.8 on 05/15/2015, 85.2 on 07/17/2015, 68.6 on 07/28/2015, 34.5 on 09/17/2015, 20.7 on 11/06/2015, 49 on 01/22/2016, 106.1 on 02/22/2016, 138.8 on 03/07/2016, 220.8 on 03/21/2016, 152.8 on 04/11/2016, 125.6 on 04/18/2016, 76.8 on 05/03/2016, 60.2 on 05/24/2016, 44 on 07/19/2016, 65.8 on 08/17/2016, 53.4 on 09/12/2016, 47.3 on 10/10/2016, 91 on 12/20/2016, 141.1 on 01/09/2017, 463.7 on 02/27/2017, 907.1 on 03/27/2017, 192.1 on 05/22/2017, and 189.1 on 05/24/2017.  She has a history of recurrent right sided pleural effusion.  She underwent thoracentesis of 650 cc on post-operative day 3.  She was admitted to [facility] on 07/01/2015 and 07/07/2015 for recurrent shortness of breath.  She underwent 2 additional thoracenteses (1.1 L on 07/02/2015 and 850 cc on 07/08/2015).  Cytology was negative x 2.  Bilateral lower extremity duplex on 07/03/2015 was negative.  Echo revealed an EF of 55-60% on 07/08/2015 and 60-65% on 05/27/2016.    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 She was on Lovenox 60 mg twice a day.  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  She has severe rheumatoid arthritis.  Methotrexate and Enbrel were initially on hold.  She has a normocytic anemia.  Work-up on 02/17/2015 and 05/24/2016  revealed a normal ferritin, B12, folate, TSH.  She denies any melena or hematochezia.    She has anemia due to chronic disease. She received 1 unit PRBCs during her admission at [facility]. She denies any melena or hematochezia. She has diabetes and is on an insulin pump.  She has had persistent neutropenia felt secondary to her rheumatoid arthritis.  Folate and MMA were normal.  TSH was 6.13 (high) with a free T4 of 1.17  (0.61-1.12).  She began methotrexate (10 mg a week) and prednisone (5 mg a day) for severe rheumatoid arthritis on 12/17/2015.  She remains on prednisone alone (5 mg a day).  Bone marrow aspirate and biopsy on 06/09/2010 revealed a hypercellular marrow (70%) with no evidence of dysplasia or malignancy.  Flow cytometry was negative.  Cytogenetics were normal (46,XX).  FISH studies were negative for MDS.  Bone marrow aspirate and biopsy on 12/03/2015 revealed a normocellular to mildy hypercellular marrow for age (40%) with left shifted myelopoiesis, non specific dyserythropoiesis and mild megakaryocytic atypia with no increase in blasts.  There were multiple small nonspecific lymphoid aggregates (favor reactive). There was no increase in reticulin.  There was decreased myeloid cells (37%) with left shifted maturation and 1% atypical myelod blasts.  There was relatively increased monocytic cells (11%), relatively increased lymphoid cells (36%), and relatively increased eosinophils (6%).  Cytogenetics were normal (30, XX).  SNP microarray was normal.  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  She receives IV magnesium twice weekly.    She has chemotherapy induced anemia.  She has received Procrit (last 11/07/2016).  She received 1 unit of PRBCs on 06/05/2017.  Labs on 08/22/2016 revealed a normal B12 and folate.  Ferritin was 201, iron saturation 21% and TIBC 307 on 06/09/2017.  B12 and folate were normal.  Code status is DNR/DNI.  She developed acute LFT elevation on 11/28/2016.  Event was preceded by upper abdominal discomfort.  Hepatitis B core antibody total and hepatitis C antibody were negative.  She is on day 15 of cycle #4 Abraxane (03/13/2017 - 05/29/2017).  Cycle #1 was truncated after day 1 secondary to side effects and Thanksgiving.  Chest, abdomen, and pelvic CT on 05/22/2017 revealed  significant enlargement of metastatic disease involving the right pleura, low paratracheal  nodal chain, omentum, peritoneal surfaces, mesentery, and liver. Tumors extending through the ostomy site into the subcutaneous tissues. There were multiple new metastatic liver and right pleural lesions.  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  There was new sclerotic lesion in the T11 vertebral body, concerning for possible metastatic disease. There was healing anterior nondisplaced rib fractures of the right second through ninth ribs that appeared new.  Symptomatically, she feels "pretty good today".  She has a stable grade II neuropathy.  Exam is stable.  WBC is 5600 with an [place] of 4000. Hemoglobin is 8.7, hematocrit 25.9, and platelets 319,000.  Magnesium is 1.4 (low).   Plan: 1.  Labs today:  CBC with diff, CMP, Mg. 2.  Magnesium low at 1.4. Will replace with 6 grams of IV magnesium.  3.  Discuss plan for reinstitution of Procrit for chemotherapy induced anemia.  Iron stores, B12, and folate were normal. Procrit injection today.  4.  Labs reviewed. Blood counts stable and adequate for treatment. Will proceed with day 15 of cycle #4 Abraxane with OnPro Neulasta.  5.  RTC on 06/16/2017  for labs  (Mg), and IV Mg. 6.  RTC on 06/19/2017 for labs (Mg, CA125) and IV Mg 7.  RTC on 06/23/2017 for labs (Mg), and IV Mg 8.  RTC on 06/26/2017 for MD assessment, labs (CBC with diff, CMP, Mg), day #1 of cycle #5 Abraxane, IV Mg, and Procrit injection.  9.  RTC on 02/27, 02/28, 03/01 for GCSF. 10.  RTC on 03/01 for labs (BMP, Mg) and IV Mg. 11.  RTC on 03/04 for MD assessment, labs (CBC with diff, CMP, Mg), day 8 of cycle #5 Abraxane, and IV Mg.   Honor Loh, NP  06/12/2017, 10:09 AM   I saw and evaluated the patient, participating in the key portions of the service and reviewing pertinent diagnostic studies and records.  I reviewed the nurse practitioner's note and agree with the  findings and the plan.  The assessment and plan were discussed with the patient.  Several questions were asked by the patient and answered.   Nolon Stalls, MD 06/12/2017,10:09 AM

## 2017-06-16 ENCOUNTER — Inpatient Hospital Stay: Payer: Medicare Other

## 2017-06-16 ENCOUNTER — Ambulatory Visit (INDEPENDENT_AMBULATORY_CARE_PROVIDER_SITE_OTHER): Payer: Medicare Other | Admitting: Urology

## 2017-06-16 ENCOUNTER — Encounter: Payer: Self-pay | Admitting: Urology

## 2017-06-16 VITALS — BP 100/57 | HR 93 | Ht 63.0 in | Wt 130.0 lb

## 2017-06-16 DIAGNOSIS — C561 Malignant neoplasm of right ovary: Secondary | ICD-10-CM | POA: Diagnosis not present

## 2017-06-16 DIAGNOSIS — N1330 Unspecified hydronephrosis: Secondary | ICD-10-CM | POA: Diagnosis not present

## 2017-06-16 LAB — MAGNESIUM: Magnesium: 1.3 mg/dL — ABNORMAL LOW (ref 1.7–2.4)

## 2017-06-16 MED ORDER — SODIUM CHLORIDE 0.9 % IV SOLN
Freq: Once | INTRAVENOUS | Status: AC
  Administered 2017-06-16: 11:00:00 via INTRAVENOUS
  Filled 2017-06-16: qty 1000

## 2017-06-16 MED ORDER — SODIUM CHLORIDE 0.9 % IV SOLN
6.0000 g | Freq: Once | INTRAVENOUS | Status: AC
  Administered 2017-06-16: 6 g via INTRAVENOUS
  Filled 2017-06-16: qty 10

## 2017-06-16 MED ORDER — HEPARIN SOD (PORK) LOCK FLUSH 100 UNIT/ML IV SOLN
250.0000 [IU] | Freq: Once | INTRAVENOUS | Status: AC | PRN
  Administered 2017-06-16: 500 [IU]

## 2017-06-16 NOTE — Progress Notes (Signed)
1100: Magnesium 1.3 per Honor Loh NP, pt to receive 6g of magnesium IV

## 2017-06-17 NOTE — Progress Notes (Signed)
Cardiology Office Note  Date:  06/20/2017   ID:  [name], [name] 09-07-1945, MRN [id number]  PCP:  Janet Haven, MD   Chief Complaint  Patient presents with  . other    OD 54mo f/u. Pt c/o sob at times; denies cp. Reviewed meds with pt verbally.    HPI:  [name] is a very pleasant 72 year old woman with history of  Ovarian CA coronary artery disease,  bypass surgery in June 2014, rheumatoid arthritis,  juvenile diabetes on insulin pump,   hemoglobin A1c 8.9, down to 6.8 , previous 7.4,  presented to [facility] may 24th 2014 with viral URI/pneumonia, shortness of breath, cough, malaise and elevated troponin of 2, peak of 12. She had spiking fevers, chills, severe shortness of breath. Sugars were in the 400s. She had a long course of antibiotics to her hospital visit. Secondary to significant cough, she was unable to lie flat for cardiac catheterization. She declined cardiac catheterization on the same hospital visit. On admission to the hospital, her immunosuppressants, Enbrel and methotrexate were held cardiac catheterization in followup showed severe three-vessel CAD.  bypass surgery, four-vessel bypass surgery with LIMA to the LAD, saphenous vein graft to the diagonal, vein graft to the OM, vein graft to the PDA.  She presents today for follow-up of her coronary disease, history of bypass   diagnosed with clinical stage IV ovarian cancer presenting with abdominal discomfort and bloating. Omental biopsy on 02/23/2015 revealed metastatic high grade serous carcinoma, consistent with gynecologic origin. CA125 was 707 on 02/17/2015.  Chest, abdomen, and pelvic CT on 05/22/2017 revealed  significant enlargement of metastatic disease involving the right pleura, low paratracheal nodal chain, omentum, peritoneal surfaces, mesentery, and liver. Tumors extending through the ostomy site into the subcutaneous tissues. There were multiple new metastatic liver and right pleural lesions.  A  left pelvic mass was implicated in obstruction of the left ureter, with prominent left hydronephrosis and hydroureter, but only subtle delay in excretion resulting.  There was new sclerotic lesion in the T11 vertebral body, concerning for possible metastatic disease. There was healing anterior nondisplaced rib fractures of the right second through ninth ribs that appeared new.  day 15 of cycle #4 Abraxane.  HCT 25.9, HBG 8.7  Having occasional episodes of lightheadedness Blood pressure elevated at home prior to taking medications Has not been taking blood pressure after her pills She is taking metoprolol tartrate 12.5 mg twice daily Trying to push her calorie intake, reports weight has been relatively stable Weight perhaps down 3 pounds in the past 6 months  Otherwise in good spirits Reports that if last round of chemotherapy does not work at there is no further treatment for her  Continues to take lovenox BID, Not taking aspirin or Plavix Not on a statin Cholesterol down with weight loss Denies any chest pain on exertion  EKG personally reviewed by myself on todays visit Shows normal sinus rhythm rate 88 bpm old anterior MI left axis deviation, unable to exclude old inferior MI  Other past medical history reviewed Developed Liver infection/abscess, put in drain, grew group B strep and enterobacter abx on ABX 8 days at [facility] IV infusion ABX , changed to pill,  Drain was removed on 09/01/2015.  Port on right, developed clot 3/17,  on lovenox BID Arm was swelling, Pleural effusion Lost weight, down 40 pounds since 10/16 Echo in 2/17, 3/17  she has seen Precious Reel for her rheumatoid arthritis, on Enbrel and methotrexate. She reports symptoms  are stable  Echocardiogram 12/23/2012 showed normal ejection fraction with no wall motion abnormality, normal right ventricular size and function, moderately elevated right ventricular systolic pressures.  Cardiac catheterization showed  100% proximally occluded RCA, critical ostial LAD disease, severe mid LAD disease, severe mid left circumflex disease, ejection fraction estimated at 50-55% with mild hypokinesis of the inferior wall.  PMH:   has a past medical history of CAD (coronary artery disease), CHF (congestive heart failure) ([place]), Cholelithiasis, Collagen vascular disease ([place]), Esophageal stricture, Exertional shortness of breath, GERD (gastroesophageal reflux disease), H/O hiatal hernia, Herniated disc, History of pancytopenia, Hyperlipidemia, Hypertension, Hypokalemia, Leukopenia (2012), NSTEMI (non-ST elevated myocardial infarction) ([place]) (08/2012), Ovarian cancer ([place]) (2016), Pneumonia (2013; 08/2012), Rheumatoid arthritis(714.0), and Type I diabetes mellitus ([place]).  PSH:    Past Surgical History:  Procedure Laterality Date  . ABDOMINAL HYSTERECTOMY  06/15/2015   Dx L/S, EXLAP TAH BSO omentectomy RSRx colostomy diaphragm resection stripping  . CARDIAC CATHETERIZATION  10/18/2012   "first one was today" (10/18/2012)  . CATARACT EXTRACTION W/ INTRAOCULAR LENS IMPLANT Right 2010  . CHOLECYSTECTOMY  06/15/2015   combined case with ovarian cancer debulking  . COLON SURGERY    . CORONARY ARTERY BYPASS GRAFT N/A 10/19/2012   Procedure: CORONARY ARTERY BYPASS GRAFTING (CABG);  Surgeon: Melrose Nakayama, MD;  Location: [place];  Service: Open Heart Surgery;  Laterality: N/A;  . ESOPHAGEAL DILATION     "3 or 4 times" (10/19/2011)  . ESOPHAGOGASTRODUODENOSCOPY  2012   Dr. Minna Merritts  . OSTOMY    . OVARY SURGERY     removal  . PERIPHERAL VASCULAR CATHETERIZATION N/A 03/02/2015   Procedure: Glori Luis Cath Insertion;  Surgeon: Algernon Huxley, MD;  Location: [place] [facility];  Service: Cardiovascular;  Laterality: N/A;  . TUBAL LIGATION  1970    Current Outpatient Medications  Medication Sig Dispense Refill  . Calcium-Vitamin D 600-200 MG-UNIT tablet Take 1 tablet by mouth 2 (two) times daily.     . cholecalciferol (VITAMIN D)  1000 units tablet Take 1,000 Units by mouth daily.    . DULoxetine (CYMBALTA) 20 MG capsule Take 1 capsule (20 mg total) by mouth daily. 90 capsule 2  . enoxaparin (LOVENOX) 60 MG/0.6ML injection INJECT 0.6ML (60MG  TOTAL) UNDER THE SKIN EVERY 12 HOURS 36 mL 1  . folic acid (FOLVITE) 1 MG tablet Take 1 tablet (1 mg total) by mouth daily. 90 tablet 3  . furosemide (LASIX) 40 MG tablet Take 1 tablet (40 mg total) by mouth daily as needed for edema. 30 tablet 1  . insulin lispro (HUMALOG) 100 UNIT/ML injection Inject 0.06 mLs (6 Units total) into the skin daily. (Patient taking differently: Inject 0-85 Units into the skin daily. Via insulin pump) 30 mL 3  . lidocaine-prilocaine (EMLA) cream APPLY 1 APPLICATION TOPICALLY AS NEEDED 1 HOUR PRIOR TO TREATMENT. COVER WITH PRESS N SEAL UNTIL TREATMENT TIME AS DIRECTED 30 g 1  . loratadine (CLARITIN) 10 MG tablet Take 1 tablet by mouth daily as needed.    . magnesium oxide (MAG-OX) 400 (241.3 Mg) MG tablet TAKE 1 TABLET BY MOUTH ONCE DAILY 30 tablet 1  . metoprolol tartrate (LOPRESSOR) 25 MG tablet Take 0.5 tablets (12.5 mg total) by mouth 2 (two) times daily. 30 tablet 0  . ondansetron (ZOFRAN) 8 MG tablet TAKE 1 TABLET BY MOUTH EVERY 8 HOURS AS NEEDED FOR NAUSEA AND VOMITING 30 tablet 1  . pantoprazole (PROTONIX) 40 MG tablet TAKE 1 TABLET BY MOUTH TWICE (2) DAILY  180 tablet 1  . potassium chloride SA (K-DUR,KLOR-CON) 20 MEQ tablet TAKE 1 TABLET BY MOUTH ONCE A DAY 90 tablet 1  . predniSONE (DELTASONE) 5 MG tablet Take 5 mg by mouth daily with breakfast.    . vitamin C (ASCORBIC ACID) 500 MG tablet Take 500 mg by mouth daily.    Marland Kitchen zinc gluconate 50 MG tablet Take 50 mg daily by mouth.     No current facility-administered medications for this visit.    Facility-Administered Medications Ordered in Other Visits  Medication Dose Route Frequency Provider Last Rate Last Dose  . 0.9 %  sodium chloride infusion   Intravenous Continuous Lequita Asal, MD    Stopped at 04/24/17 1140  . magnesium sulfate IVPB 4 g 100 mL  4 g Intravenous Once Faythe Casa E, NP      . sodium chloride 0.9 % injection 10 mL  10 mL Intracatheter PRN Corcoran, Melissa C, MD      . sodium chloride 0.9 % injection 10 mL  10 mL Intravenous PRN Lequita Asal, MD   10 mL at 04/13/15 5809     Allergies:   Codeine and Latex   Social History:  The patient  reports that she quit smoking about 23 years ago. Her smoking use included cigarettes. She has a 30.00 pack-year smoking history. she has never used smokeless tobacco. She reports that she does not drink alcohol or use drugs.   Family History:   family history includes Aneurysm in her sister; Arthritis in her father and mother; Cancer in her sister; Diabetes in her father and mother.    Review of Systems: Review of Systems  Constitutional: Positive for malaise/fatigue.  Respiratory: Positive for shortness of breath.   Gastrointestinal: Negative.   Musculoskeletal: Negative.   Neurological: Positive for weakness.  Psychiatric/Behavioral: Negative.   All other systems reviewed and are negative.    PHYSICAL EXAM: VS:  BP (!) 105/40 (BP Location: Left Arm, Patient Position: Sitting, Cuff Size: Normal)   Pulse 88   Ht 5\' 3"  (1.6 m)   Wt 131 lb 4 oz (59.5 kg)   BMI 23.25 kg/m  , BMI Body mass index is 23.25 kg/m. Constitutional:  oriented to person, place, and time. No distress. Appears pale HENT:  Head: Normocephalic and atraumatic.  Eyes:  no discharge. No scleral icterus.  Neck: Normal range of motion. Neck supple. No JVD present.  Cardiovascular: Normal rate, regular rhythm, normal heart sounds and intact distal pulses. Exam reveals no gallop and no friction rub. No edema No murmur heard. Pulmonary/Chest: Effort normal and breath sounds normal. No stridor. No respiratory distress.  no wheezes.  no rales.  no tenderness.  Abdominal: Soft.  no distension.  no tenderness.  Musculoskeletal: Normal range  of motion.  no  tenderness or deformity.  Neurological:  normal muscle tone. Coordination normal. No atrophy Skin: Skin is warm and dry. No rash noted. not diaphoretic.  Psychiatric:  normal mood and affect. behavior is normal. Thought content normal.      Recent Labs: 11/28/2016: TSH 2.372 06/12/2017: ALT 12; BUN 18; Creatinine, Ser 0.71; Hemoglobin 8.7; Platelets 319; Potassium 3.6; Sodium 135 06/19/2017: Magnesium 1.2    Lipid Panel Lab Results  Component Value Date   CHOL 174 05/28/2016   HDL 60 05/28/2016   LDLCALC 98 05/28/2016   TRIG 80 05/28/2016      Wt Readings from Last 3 Encounters:  06/20/17 131 lb 4 oz (59.5 kg)  06/16/17 130  lb (59 kg)  06/12/17 131 lb 1.6 oz (59.5 kg)       ASSESSMENT AND PLAN:  Essential hypertension - Plan: EKG 12-Lead Low blood pressure on today's visit decrease metoprolol down to 12.5 mg once a day in the morning May have to hold metoprolol for further weight loss  Coronary artery disease due to lipid rich plaque - Plan: EKG 12-Lead Denies any anginal symptoms, no further testing needed at this time Stable  Mixed hyperlipidemia Currently not on a cholesterol medication Cholesterol down with weight loss Addressed with her again today  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 No further workup at this time. Continue current medication regimen.  Stable   Total encounter time more than 25 minutes  Greater than 50% was spent in counseling and coordination of care with the patient   Disposition:   F/U  6 months   Orders Placed This Encounter   Procedures  . EKG 12-Lead     Signed, Esmond Plants, M.D., Ph.D. 06/20/2017  [place], [place]

## 2017-06-18 NOTE — Progress Notes (Signed)
06/16/2017 2:06 PM   [name] 12-09-1945 [id number]  Referring provider: Leone Haven, MD [address] [place], [place] [address]  Chief Complaint  Patient presents with  . Other    HPI: 72 year old female with progressive stage IV ovarian cancer managed by Dr. Mike Gip who returns the office today.  She was previously seen approximately 1 year ago with worsening bilateral hydronephrosis, left greater than right.  At that time, her creatinine was preserved without flank pain.  As such, the nephrostomy tube and stent were deferred.  She responded fairly well to 2 cycles of Doxil with Neulasta followed by 4 cycles of topotecan can with Neulasta and most recently [facility] with improvement of her left hydronephrosis but progression in other areas.    She is now on cycle 4 of abraxane.  More recently, she has had significant radiographic progression with worsening again of her left hydronephrosis.  She is a right extrarenal pelvis.  Her creatinine remained stable.  It does not appear that her tumor markers correlate with her radiographic progression on CT scan.  She does report occasional intermittent left flank pain but this is not particularly bothersome.  She has no significant urinary symptoms.  No gross hematuria.  She reports being told that if she does not respond to this particular chemotherapeutic agent, her options may be limited and overall prognosis poor.   PMH: Past Medical History:  Diagnosis Date  . CAD (coronary artery disease)    a. 09/2012 Cath: LM nl, LAD 95p, 19m LCX 919mOM2 50, RCA 100.  . Marland KitchenHF (congestive heart failure) ([facility]  . Cholelithiasis   . Collagen vascular disease (HCC)    Rheumatoid Arthritis.  . Esophageal stricture   . Exertional shortness of breath   . GERD (gastroesophageal reflux disease)   . H/O hiatal hernia   . Herniated disc   . History of pancytopenia   . Hyperlipidemia   . Hypertension   . Hypokalemia   .  Leukopenia 2012   s/p bone marrow biopsy, Dr. PaMa Hillock. NSTEMI (non-ST elevated myocardial infarction) ([facility]/2014   "mild" (10/18/2012)  . Ovarian cancer ([facility]   chemo and hysterectomy  . Pneumonia 2013; 08/2012   "one lung; double" (10/18/2012)  . Rheumatoid arthritis(714.0)   . Type I diabetes mellitus ([facility]   "dx'd in 1957" (10/18/2012)    Surgical History: Past Surgical History:  Procedure Laterality Date  . ABDOMINAL HYSTERECTOMY  06/15/2015   Dx L/S, EXLAP TAH BSO omentectomy RSRx colostomy diaphragm resection stripping  . CARDIAC CATHETERIZATION  10/18/2012   "first one was today" (10/18/2012)  . CATARACT EXTRACTION W/ INTRAOCULAR LENS IMPLANT Right 2010  . CHOLECYSTECTOMY  06/15/2015   combined case with ovarian cancer debulking  . COLON SURGERY    . CORONARY ARTERY BYPASS GRAFT N/A 10/19/2012   Procedure: CORONARY ARTERY BYPASS GRAFTING (CABG);  Surgeon: StMelrose NakayamaMD;  [facility] Service: Open Heart Surgery;  Laterality: N/A;  . ESOPHAGEAL DILATION     "3 or 4 times" (10/19/2011)  . ESOPHAGOGASTRODUODENOSCOPY  2012   Dr. IfMinna Merritts. OSTOMY    . OVARY SURGERY     removal  . PERIPHERAL VASCULAR CATHETERIZATION N/A 03/02/2015   Procedure: PoGlori Luisath Insertion;  Surgeon: JaAlgernon HuxleyMD;  [facility];  Service: Cardiovascular;  Laterality: N/A;  . TUBAL LIGATION  1970    Home Medications:  Allergies as of 06/16/2017  Reactions   Codeine Nausea And Vomiting   Latex Rash      Medication List        Accurate as of 06/16/17 11:59 PM. Always use your most recent med list.          Calcium-Vitamin D 600-200 MG-UNIT tablet Take 1 tablet by mouth 2 (two) times daily.   cholecalciferol 1000 units tablet Commonly known as:  VITAMIN D Take 1,000 Units by mouth daily.   DULoxetine 20 MG capsule Commonly known as:  CYMBALTA Take 1 capsule (20 mg total) by mouth daily.   enoxaparin 60 MG/0.6ML injection Commonly known as:   LOVENOX INJECT 0.6 ML (60 MG TOTAL) INTO THE SKIN EVERY 12 HOURS   folic acid 1 MG tablet Commonly known as:  FOLVITE Take 1 tablet (1 mg total) by mouth daily.   furosemide 40 MG tablet Commonly known as:  LASIX Take 1 tablet (40 mg total) by mouth daily as needed for edema.   insulin lispro 100 UNIT/ML injection Commonly known as:  HUMALOG Inject 0.06 mLs (6 Units total) into the skin daily.   lidocaine-prilocaine cream Commonly known as:  EMLA APPLY 1 APPLICATION TOPICALLY AS NEEDED 1 HOUR PRIOR TO TREATMENT. COVER WITH PRESS N SEAL UNTIL TREATMENT TIME AS DIRECTED   loratadine 10 MG tablet Commonly known as:  CLARITIN Take 1 tablet by mouth daily as needed.   magnesium oxide 400 (241.3 Mg) MG tablet Commonly known as:  MAG-OX TAKE 1 TABLET BY MOUTH ONCE DAILY   metoprolol succinate 12.5 mg Tb24 24 hr tablet Commonly known as:  TOPROL-XL Take 12.5 mg by mouth 2 (two) times daily.   metoprolol tartrate 25 MG tablet Commonly known as:  LOPRESSOR Take 0.5 tablets (12.5 mg total) by mouth 2 (two) times daily.   ondansetron 8 MG tablet Commonly known as:  ZOFRAN TAKE 1 TABLET BY MOUTH EVERY 8 HOURS AS NEEDED FOR NAUSEA AND VOMITING   pantoprazole 40 MG tablet Commonly known as:  PROTONIX TAKE 1 TABLET BY MOUTH TWICE (2) DAILY   potassium chloride SA 20 MEQ tablet Commonly known as:  K-DUR,KLOR-CON TAKE 1 TABLET BY MOUTH ONCE A DAY   predniSONE 5 MG tablet Commonly known as:  DELTASONE Take 5 mg by mouth daily with breakfast.   vitamin C 500 MG tablet Commonly known as:  ASCORBIC ACID Take 500 mg by mouth daily.   zinc gluconate 50 MG tablet Take 50 mg daily by mouth.       Allergies:  Allergies  Allergen Reactions  . Codeine Nausea And Vomiting  . Latex Rash    Family History: Family History  Problem Relation Age of Onset  . Diabetes Mother   . Arthritis Mother   . Diabetes Father   . Arthritis Father   . Aneurysm Sister        neck  . Cancer  Sister        lung    Social History:  reports that she quit smoking about 23 years ago. Her smoking use included cigarettes. She has a 30.00 pack-year smoking history. she has never used smokeless tobacco. She reports that she does not drink alcohol or use drugs.  ROS: UROLOGY Frequent Urination?: No Hard to postpone urination?: No Burning/pain with urination?: No Get up at night to urinate?: No Leakage of urine?: Yes Urine stream starts and stops?: Yes Trouble starting stream?: No Do you have to strain to urinate?: Yes Blood in urine?: No Urinary tract infection?: No  Sexually transmitted disease?: No Injury to kidneys or bladder?: No Painful intercourse?: No Weak stream?: Yes Currently pregnant?: No Vaginal bleeding?: No Last menstrual period?: n  Gastrointestinal Nausea?: Yes Vomiting?: No Indigestion/heartburn?: Yes Diarrhea?: Yes Constipation?: Yes  Constitutional Fever: No Night sweats?: No Weight loss?: No Fatigue?: Yes  Skin Skin rash/lesions?: No Itching?: Yes  Eyes Blurred vision?: Yes Double vision?: No  Ears/Nose/Throat Sore throat?: No Sinus problems?: Yes  Hematologic/Lymphatic Swollen glands?: No Easy bruising?: Yes  Cardiovascular Leg swelling?: Yes Chest pain?: No  Respiratory Cough?: No Shortness of breath?: Yes  Endocrine Excessive thirst?: No  Musculoskeletal Back pain?: Yes Joint pain?: Yes  Neurological Headaches?: Yes Dizziness?: Yes  Psychologic Depression?: No Anxiety?: No  Physical Exam: BP (!) 100/57   Pulse 93   Ht 5' 3"  (1.6 m)   Wt 130 lb (59 kg)   BMI 23.03 kg/m   Constitutional:  Alert and oriented, No acute distress.  Accompanied by husband today.  Pale appearing, somewhat frail. HEENT: [place] AT, moist mucus membranes.  Trachea midline, no masses. Cardiovascular: No clubbing, cyanosis, or edema. Respiratory: Normal respiratory effort, no increased work of breathing. GU: No CVA tenderness.    Neurologic: Grossly intact, no focal deficits, moving all 4 extremities. Psychiatric: Normal mood and affect.  Laboratory Data: Lab Results  Component Value Date   WBC 5.6 06/12/2017   HGB 8.7 (L) 06/12/2017   HCT 25.9 (L) 06/12/2017   MCV 90.3 06/12/2017   PLT 319 06/12/2017    Lab Results  Component Value Date   CREATININE 0.71 06/12/2017   Lab Results  Component Value Date   HGBA1C 6.5 (H) 05/28/2016    Urinalysis Lab Results  Component Value Date   APPEARANCEUR CLEAR (A) 01/13/2017   LEUKOCYTESUR NEGATIVE 01/13/2017   PROTEINUR NEGATIVE 01/13/2017   GLUCOSEU NEGATIVE 01/13/2017   RBCU 0-5 01/13/2017   BILIRUBINUR NEGATIVE 01/13/2017   NITRITE NEGATIVE 01/13/2017    Lab Results  Component Value Date   BACTERIA NONE SEEN 01/13/2017    Pertinent Imaging: CLINICAL DATA:  Restaging of metastatic ovarian cancer. Prior chemotherapy and radiation therapy.  EXAM: CT CHEST, ABDOMEN, AND PELVIS WITH CONTRAST  TECHNIQUE: Multidetector CT imaging of the chest, abdomen and pelvis was performed following the standard protocol during bolus administration of intravenous contrast.  CONTRAST:  142m ISOVUE-300 IOPAMIDOL (ISOVUE-300) INJECTION 61%  COMPARISON:  Multiple exams, including CT exams from 01/20/2017 and 02/15/2016, as well as abdominal MRI from 12/09/2016.  FINDINGS: CT CHEST FINDINGS  Cardiovascular: Coronary, aortic arch, and branch vessel atherosclerotic vascular disease. Prior CABG. Right Port-A-Cath tip: Cavoatrial junction. Borderline cardiomegaly. Calcified mitral valve.  Mediastinum/Nodes: Stable calcified nodule in the left thyroid lobe.  Lower right paratracheal lymph node 1.4 cm in short axis on image 20/10, formerly 1.0 cm. Left infrahilar lymph node 1.0 cm in short axis on image 26/10, previously 0.7 cm.  A subcarinal node measures 1.3 cm in short axis on image 25/10, essentially stable by my measurements compared to the  recent CT abdomen from 01/20/2017.  Borderline enlarged AP window lymph nodes.  Lungs/Pleura: Small left pleural effusion is nevertheless larger than on 02/15/2016. Multiple pleural-based masses on the right side. An index lesion measures 2.7 by 2.0 cm on image 27/10, formerly 1.3 by 1.3 cm. New pleural lesions such as the 1.0 by 0.7 cm pleural base nodule in the right lower lobe margin on image 33/10. A pleural-based right lower lobe mass measures 4.4 by 2.3 cm on image 40/10, and on the  recent CT abdomen from 01/20/2017 this measured 3.0 by 1.6 cm by my measurements.  Old granulomatous disease in the right upper lobe. Chronic scarring anteriorly in the right lower lobe and also in the lingula. Volume loss along the right hemidiaphragm.  Musculoskeletal: Compared to 01/20/2017 there is a new 1.2 cm sclerotic lesion along the right superior endplate of E28. Old superior endplate compression fracture at T12.  Anterior healing nondisplaced rib fractures at the right second, third, fourth, fifth, sixth, seventh, eighth, and ninth ribs. The lower rib fractures were certainly not present on 01/20/2017. There is some generalized marrow heterogeneity although some of this may simply be due to bony demineralization.  CT ABDOMEN PELVIS FINDINGS  Hepatobiliary: Enlarging capsular and subcapsular metastatic lesions along the liver along with numerous scattered new metastatic lesions to the hepatic parenchyma. The conglomerate porta hepatis mass also extending along the falciform ligament and likely invading the liver measures 10.6 by 6.6 cm today, and previously 6.1 by 3.4 cm on 01/20/2017. The solid capsular deposit along the upper margin of the liver measures 2.5 by 5.3 by 5.3 cm today, and previously 1.5 by 3.6 by 2.9 cm. One of many new small intraparenchymal nodules in the liver is a 1.1 by 0.8 cm hypoenhancing lesion in the lateral segment left hepatic lobe on image 50/10. I  do not see any current intrahepatic biliary dilatation although there is extensive tumor in the expected vicinity of the common hepatic duct. Common bile duct measures up to 0.9 cm, previously the same.  Pancreas: Suspected loculated fluid or cystic lesion in the lesser sac region.  Spleen: Progressive capsular tumor deposition along the spleen. A lesion posteriorly measures 3.3 by 1.9 cm on image 55/10, formerly 2.1 by 0.8 cm.  Adrenals/Urinary Tract: Prominent left hydroureter and hydronephrosis with the dilated ureter extending down into the pelvis. Enlarging solid and cystic left pelvic mass likely impinging on the ureter and causing this obstruction, the pelvic mass currently measures 8.6 by 6.7 cm on image 97/10 (formerly 6.3 by 5.0 cm).  Stomach/Bowel: Abnormal fluid collections likely reflecting cystic malignancy scattered around various portions of the stomach. The portion along the stomach fundus is increased in size whereas the collection along between the stomach in the liver may be minimally reduced in volume. Scattered tumor deposition along the omentum and mesentery displacing bowel, generally worsened. A solid mass within along within or along the ascending colon measures 5.6 by 5.1 cm on image 81/10, formerly 3.8 by 4.9 cm. Left colostomy noted. No obstruction.  Vascular/Lymphatic: Aortoiliac atherosclerotic vascular disease. Continued porta hepatis/peripancreatic adenopathy. Mild gastrohepatic ligament adenopathy.  Reproductive: Uterus absent. Tumor deposition in the expected vicinity of both adnexa, worsened.  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 As noted above, scattered solid and cystic omental and mesenteric tumor deposits in general are worsened from  prior.  Musculoskeletal: Lumbar spondylosis and degenerative disc disease causing mild impingement at L5-S1. subcutaneous nodularity likely representing injection sites.  IMPRESSION: 1. Significant enlargement of metastatic disease involving the right pleura, low paratracheal nodal chain, omentum, peritoneal surfaces, mesentery, and liver. Tumors extending through the ostomy site into the subcutaneous tissues. Multiple new metastatic liver and right pleural lesions. 2. A left pelvic mass is implicated in obstruction of the left ureter, with prominent left hydronephrosis and hydroureter, but only subtle delay  in excretion resulting. 3. New sclerotic lesion in the T11 vertebral body, concerning for possible metastatic disease. 4. Healing anterior nondisplaced rib fractures of the right second through ninth ribs, these appear to be new compared to 01/20/2017. 5. Aortic Atherosclerosis (ICD10-I70.0). Coronary atherosclerosis. Calcified mitral valve. Lumbar spondylosis and degenerative disc disease. Small left pleural effusion.   Electronically Signed   By: Van Clines M.D.   On: 05/22/2017 09:42  CT scan personally reviewed today with the patient.  Agree with progression of left hydroureteronephrosis with massive dilation of the ureter down to level of the left pelvic mass.  Assessment & Plan:    1. Hydronephrosis of left kidney Pilar Plate worsening of left hydronephrosis secondary to extrinsic compression of the ureter Overall renal function continues to be preserved and minimally symptomatic Right renal pelvic fullness secondary to extrarenal pelvis rather than obstruction As previously, we discussed her options at length including placement of percutaneous nephrostomy tube versus ureteral stent to preserve her overall renal function At this point time, she is not interested in pursuing any intervention at and understands that prolonged obstruction could lead to compromise of  the left kidney function over time She would like to see how she responds to this round of chemotherapy as well as her radiographic progression on her next scan which will likely be in the near future If she continues to progress on this particular agent, she may elect hospice and no further intervention which is reasonable given the extent of her disease If she has a reasonable response, she may consider intervention  Return after next imaging   Hollice Espy, MD  [facility] [address] [place], [place] [address] [phone]  I spent 25 min with this patient of which greater than 50% was spent in counseling and coordination of care with the patient.

## 2017-06-19 ENCOUNTER — Inpatient Hospital Stay: Payer: Medicare Other

## 2017-06-19 ENCOUNTER — Other Ambulatory Visit: Payer: Self-pay | Admitting: Hematology and Oncology

## 2017-06-19 VITALS — BP 122/66 | HR 85 | Temp 96.1°F | Resp 20

## 2017-06-19 DIAGNOSIS — C561 Malignant neoplasm of right ovary: Secondary | ICD-10-CM | POA: Diagnosis not present

## 2017-06-19 DIAGNOSIS — C800 Disseminated malignant neoplasm, unspecified: Principal | ICD-10-CM

## 2017-06-19 DIAGNOSIS — Z5111 Encounter for antineoplastic chemotherapy: Secondary | ICD-10-CM

## 2017-06-19 LAB — MAGNESIUM: Magnesium: 1.2 mg/dL — ABNORMAL LOW (ref 1.7–2.4)

## 2017-06-19 MED ORDER — HEPARIN SOD (PORK) LOCK FLUSH 100 UNIT/ML IV SOLN
500.0000 [IU] | Freq: Once | INTRAVENOUS | Status: AC
  Administered 2017-06-19: 500 [IU] via INTRAVENOUS
  Filled 2017-06-19: qty 5

## 2017-06-19 MED ORDER — SODIUM CHLORIDE 0.9 % IV SOLN
Freq: Once | INTRAVENOUS | Status: AC
  Administered 2017-06-19: 10:00:00 via INTRAVENOUS
  Filled 2017-06-19: qty 1000

## 2017-06-19 MED ORDER — SODIUM CHLORIDE 0.9% FLUSH
10.0000 mL | INTRAVENOUS | Status: DC | PRN
  Administered 2017-06-19: 10 mL via INTRAVENOUS
  Filled 2017-06-19: qty 10

## 2017-06-19 MED ORDER — SODIUM CHLORIDE 0.9 % IV SOLN
6.0000 g | Freq: Once | INTRAVENOUS | Status: AC
  Administered 2017-06-19: 6 g via INTRAVENOUS
  Filled 2017-06-19: qty 10

## 2017-06-20 ENCOUNTER — Telehealth: Payer: Self-pay | Admitting: *Deleted

## 2017-06-20 ENCOUNTER — Ambulatory Visit: Payer: Medicare Other | Admitting: Cardiovascular Disease

## 2017-06-20 ENCOUNTER — Encounter: Payer: Self-pay | Admitting: Cardiovascular Disease

## 2017-06-20 VITALS — BP 105/40 | HR 88 | Ht 63.0 in | Wt 131.2 lb

## 2017-06-20 DIAGNOSIS — I10 Essential (primary) hypertension: Secondary | ICD-10-CM | POA: Diagnosis not present

## 2017-06-20 DIAGNOSIS — E782 Mixed hyperlipidemia: Secondary | ICD-10-CM

## 2017-06-20 DIAGNOSIS — Z951 Presence of aortocoronary bypass graft: Secondary | ICD-10-CM

## 2017-06-20 DIAGNOSIS — I209 Angina pectoris, unspecified: Secondary | ICD-10-CM

## 2017-06-20 DIAGNOSIS — I25708 Atherosclerosis of coronary artery bypass graft(s), unspecified, with other forms of angina pectoris: Secondary | ICD-10-CM | POA: Diagnosis not present

## 2017-06-20 DIAGNOSIS — E1059 Type 1 diabetes mellitus with other circulatory complications: Secondary | ICD-10-CM

## 2017-06-20 DIAGNOSIS — E1159 Type 2 diabetes mellitus with other circulatory complications: Secondary | ICD-10-CM

## 2017-06-20 LAB — CA 125: Cancer Antigen (CA) 125: 144.3 U/mL — ABNORMAL HIGH (ref 0.0–38.1)

## 2017-06-20 MED ORDER — METOPROLOL TARTRATE 25 MG PO TABS: 12.5000 mg | ORAL_TABLET | Freq: Every day | ORAL | 3 refills | Status: DC

## 2017-06-20 NOTE — Telephone Encounter (Signed)
-----   Message from Lequita Asal, MD sent at 06/20/2017  6:40 AM EST ----- Regarding: Please call patient  CA125 has decreased again.  M  ----- Message ----- From: Interface, [facility] [place] Sent: 06/19/2017   8:37 AM To: Lequita Asal, MD

## 2017-06-20 NOTE — Addendum Note (Signed)
Addended by: Valora Corporal on: 06/20/2017 09:46 AM   Modules accepted: Orders

## 2017-06-20 NOTE — Telephone Encounter (Signed)
Called patient to inform her that her CA 29.29 has decreased.  Patient grateful for call.

## 2017-06-20 NOTE — Patient Instructions (Addendum)
Medication Instructions:   Try metoprolol in the morning Skip the evening  Drink extra fluids for low blood pressure  Labwork:  No new labs needed  Testing/Procedures:  No further testing at this time   Follow-Up: It was a pleasure seeing you in the office today. Please call us if you have new issues that need to be addressed before your next appt.  [phone]  Your physician wants you to follow-up in: 6 months.  You will receive a reminder letter in the mail two months in advance. If you don't receive a letter, please call our office to schedule the follow-up appointment.  If you need a refill on your cardiac medications before your next appointment, please call your pharmacy.

## 2017-06-23 ENCOUNTER — Inpatient Hospital Stay: Payer: Medicare Other

## 2017-06-23 DIAGNOSIS — C561 Malignant neoplasm of right ovary: Secondary | ICD-10-CM | POA: Diagnosis not present

## 2017-06-23 LAB — MAGNESIUM: Magnesium: 1.4 mg/dL — ABNORMAL LOW (ref 1.7–2.4)

## 2017-06-23 MED ORDER — TBO-FILGRASTIM 300 MCG/0.5ML ~~LOC~~ SOSY: 300.0000 ug | PREFILLED_SYRINGE | Freq: Once | SUBCUTANEOUS | Status: DC

## 2017-06-23 MED ORDER — PEGFILGRASTIM INJECTION 6 MG/0.6ML ~~LOC~~: 6.0000 mg | PREFILLED_SYRINGE | Freq: Once | SUBCUTANEOUS | Status: DC

## 2017-06-23 MED ORDER — MAGNESIUM SULFATE 50 % IJ SOLN
6.0000 g | Freq: Once | INTRAMUSCULAR | Status: AC
  Administered 2017-06-23: 6 g via INTRAVENOUS
  Filled 2017-06-23: qty 2

## 2017-06-23 MED ORDER — SODIUM CHLORIDE 0.9% FLUSH
10.0000 mL | Freq: Once | INTRAVENOUS | Status: AC
  Administered 2017-06-23: 10 mL via INTRAVENOUS
  Filled 2017-06-23: qty 10

## 2017-06-23 MED ORDER — HEPARIN SOD (PORK) LOCK FLUSH 100 UNIT/ML IV SOLN
500.0000 [IU] | Freq: Once | INTRAVENOUS | Status: AC
  Administered 2017-06-23: 500 [IU] via INTRAVENOUS
  Filled 2017-06-23: qty 5

## 2017-06-23 MED ORDER — SODIUM CHLORIDE 0.9 % IV SOLN
INTRAVENOUS | Status: DC
  Administered 2017-06-23: 10:00:00 via INTRAVENOUS
  Filled 2017-06-23: qty 1000

## 2017-06-23 MED ORDER — EPOETIN ALFA 10000 UNIT/ML IJ SOLN: 10000.0000 [IU] | Freq: Once | INTRAMUSCULAR | Status: DC

## 2017-06-23 NOTE — Progress Notes (Signed)
6 gm of mag today per Honor Loh, NP/Dr. Mike Gip

## 2017-06-24 ENCOUNTER — Other Ambulatory Visit: Payer: Self-pay | Admitting: Hematology and Oncology

## 2017-06-24 NOTE — Progress Notes (Signed)
[facility] day:  06/26/2017   Chief Complaint: [name] is a 72 y.o. female with stage IV ovarian cancer who is seen for review of interval scans and assessment prior to day 1 of cycle #5 Abraxane.  HPI:  The patient was last seen in the medical oncology clinic on 06/12/2017.  At that time, she felt "pretty good".  She had a stable grade II neuropathy.  Exam was stable.  WBC was 5600 with an Ste[place] of 4000. Hemoglobin was 8.7, hematocrit 25.9, and platelets 319,000.  Magnesium was 1.4 (low).  She received day 15 Abraxane, IV magnesium, Procrit, and OnPro Neulasta.  She saw Dr. Erlene Quan on 06/16/2017.  She was noted to have worsening of left hydronephrosis secondary to extrinsic compression of the ureter.  Renal function was preserved with minimal symptoms.  Possible future nterventions were discussed. She has a follow-up after next imaging.  She saw Dr. Rockey Situ on 06/20/2017.  Blood pressure was low and thus metoprolol was decreased to 12.5 mg a day.  She has a follow-up in 6 months.  During the interim, patient notes that she is having "a lot of sickness in the morning". Patient complains of nausea. The prescribed antiemetics are effective in managing her nausea. Patient complains of abdominal "pressure", mainly in the epigastric area. She has been constipated more as of late. Bowels are moving on a daily basis, however patient describes her stools as being "knotty". Patient having difficulty when she eats. Patient states, "I am having to belch a lot before I can eat anything and have it pass into my stomach".   Patient denies any B symptoms or recent infections. She continues to have a stable appetite. Her weight is down 1 pound. Patient denies pain in the clinic today. Patient pale in appearance.    Past Medical History:  Diagnosis Date  . CAD (coronary artery disease)    a. 09/2012 Cath: LM nl, LAD 95p, 51m LCX 921mOM2 50, RCA 100.  . Marland KitchenHF  (congestive heart failure) ([facility]  . Cholelithiasis   . Collagen vascular disease (HCC)    Rheumatoid Arthritis.  . Esophageal stricture   . Exertional shortness of breath   . GERD (gastroesophageal reflux disease)   . H/O hiatal hernia   . Herniated disc   . History of pancytopenia   . Hyperlipidemia   . Hypertension   . Hypokalemia   . Leukopenia 2012   s/p bone marrow biopsy, Dr. PaMa Hillock. NSTEMI (non-ST elevated myocardial infarction) ([facility]/2014   "mild" (10/18/2012)  . Ovarian cancer ([facility]   chemo and hysterectomy  . Pneumonia 2013; 08/2012   "one lung; double" (10/18/2012)  . Rheumatoid arthritis(714.0)   . Type I diabetes mellitus ([facility]   "dx'd in 1957" (10/18/2012)    Past Surgical History:  Procedure Laterality Date  . ABDOMINAL HYSTERECTOMY  06/15/2015   Dx L/S, EXLAP TAH BSO omentectomy RSRx colostomy diaphragm resection stripping  . CARDIAC CATHETERIZATION  10/18/2012   "first one was today" (10/18/2012)  . CATARACT EXTRACTION W/ INTRAOCULAR LENS IMPLANT Right 2010  . CHOLECYSTECTOMY  06/15/2015   combined case with ovarian cancer debulking  . COLON SURGERY    . CORONARY ARTERY BYPASS GRAFT N/A 10/19/2012   Procedure: CORONARY ARTERY BYPASS GRAFTING (CABG);  Surgeon: StMelrose NakayamaMD;  [facility] Service: Open Heart Surgery;  Laterality: N/A;  . ESOPHAGEAL DILATION     "3 or  4 times" (10/19/2011)  . ESOPHAGOGASTRODUODENOSCOPY  2012   Dr. Minna Merritts  . OSTOMY    . OVARY SURGERY     removal  . PERIPHERAL VASCULAR CATHETERIZATION N/A 03/02/2015   Procedure: Glori Luis Cath Insertion;  Surgeon: Algernon Huxley, MD;  Location: [facility];  Service: Cardiovascular;  Laterality: N/A;  . TUBAL LIGATION  1970    Family History  Problem Relation Age of Onset  . Diabetes Mother   . Arthritis Mother   . Diabetes Father   . Arthritis Father   . Aneurysm Sister        neck  . Cancer Sister        lung    Social History:  reports that she quit  smoking about 23 years ago. Her smoking use included cigarettes. She has a 30.00 pack-year smoking history. she has never used smokeless tobacco. She reports that she does not drink alcohol or use drugs.  Patient is a retired Art therapist. Patient denies exposure to radiation and toxins. She spends every Thursday afternoon with her sister.  Her son is [name] and daughter is [name].  She is accompanied by her husband today.  Allergies:  Allergies  Allergen Reactions  . Codeine Nausea And Vomiting  . Latex Rash    Current Medications: Current Outpatient Medications  Medication Sig Dispense Refill  . Calcium-Vitamin D 600-200 MG-UNIT tablet Take 1 tablet by mouth 2 (two) times daily.     . cholecalciferol (VITAMIN D) 1000 units tablet Take 1,000 Units by mouth daily.    . DULoxetine (CYMBALTA) 20 MG capsule Take 1 capsule (20 mg total) by mouth daily. 90 capsule 2  . enoxaparin (LOVENOX) 60 MG/0.6ML injection INJECT 0.6ML (60MG TOTAL) UNDER THE SKIN EVERY 12 HOURS 36 mL 1  . folic acid (FOLVITE) 1 MG tablet Take 1 tablet (1 mg total) by mouth daily. 90 tablet 3  . furosemide (LASIX) 40 MG tablet Take 1 tablet (40 mg total) by mouth daily as needed for edema. 30 tablet 1  . insulin lispro (HUMALOG) 100 UNIT/ML injection Inject 0.06 mLs (6 Units total) into the skin daily. (Patient taking differently: Inject 0-85 Units into the skin daily. Via insulin pump) 30 mL 3  . lidocaine-prilocaine (EMLA) cream APPLY 1 APPLICATION TOPICALLY AS NEEDED 1 HOUR PRIOR TO TREATMENT. COVER WITH PRESS N SEAL UNTIL TREATMENT TIME AS DIRECTED 30 g 1  . loratadine (CLARITIN) 10 MG tablet Take 1 tablet by mouth daily as needed.    . magnesium oxide (MAG-OX) 400 (241.3 Mg) MG tablet TAKE 1 TABLET BY MOUTH ONCE DAILY 30 tablet 1  . metoprolol tartrate (LOPRESSOR) 25 MG tablet Take 0.5 tablets (12.5 mg total) by mouth daily after breakfast. 45 tablet 3  . ondansetron (ZOFRAN) 8 MG tablet TAKE 1 TABLET BY MOUTH EVERY 8  HOURS AS NEEDED FOR NAUSEA AND VOMITING 30 tablet 1  . pantoprazole (PROTONIX) 40 MG tablet TAKE 1 TABLET BY MOUTH TWICE (2) DAILY 180 tablet 1  . potassium chloride SA (K-DUR,KLOR-CON) 20 MEQ tablet TAKE 1 TABLET BY MOUTH ONCE A DAY 90 tablet 1  . predniSONE (DELTASONE) 5 MG tablet Take 5 mg by mouth daily with breakfast.    . vitamin C (ASCORBIC ACID) 500 MG tablet Take 500 mg by mouth daily.    Marland Kitchen zinc gluconate 50 MG tablet Take 50 mg daily by mouth.     No current facility-administered medications for this visit.    Facility-Administered Medications Ordered in Other  Visits  Medication Dose Route Frequency Provider Last Rate Last Dose  . 0.9 %  sodium chloride infusion   Intravenous Continuous Lequita Asal, MD   Stopped at 04/24/17 1140  . magnesium sulfate IVPB 4 g 100 mL  4 g Intravenous Once Faythe Casa E, NP      . sodium chloride 0.9 % injection 10 mL  10 mL Intracatheter PRN Corcoran, Melissa C, MD      . sodium chloride 0.9 % injection 10 mL  10 mL Intravenous PRN Lequita Asal, MD   10 mL at 04/13/15 6767    Review of Systems:  GENERAL:  Feels ok".  Fatigue.  No fever, chills or sweats.  Weight down 1 pound. PERFORMANCE STATUS (ECOG):  1-2. HEENT:  No visual changes, sore throat, mouth sores or tenderness. Lungs:  Shortness of breath on exertion.  No cough.  No hemoptysis. Cardiac:  No chest pain, palpitations, orthopnea, or PND. GI:  Pressure in epigastric area.  Belching.  Constipation (see HPI).  Nausea in AM, well managed.  No vomiting, diarrhea, melena or hematochezia. GU:  No urgency, frequency, dysuria, or hematuria. Musculoskeletal:  Back pain.  Severe rheumatoid arthritis (on prednisone).  Osteoporosis.  No back pain. No muscle tenderness. Extremities:  No pain or swelling. Skin:  No increased bruising or bleeding.  No rashes or skin changes. Neuro:  Neuropathy in hands and feet (no change).  No headache, numbness or weakness, balance or coordination  issues. Endocrine:  Diabetes on an insulin pump.  Blood sugar elevated on steroids.  Occasional hot flashes.  Psych:  No mood changes, depression or anxiety.  Sleeping more. Pain:  No focal pain.  Review of systems:  All other systems reviewed and found to be negative.  Physical Exam:  Blood pressure 119/65, pulse 96, temperature 98 F (36.7 C), resp. rate 18, weight 130 lb 5 oz (59.1 kg). GENERAL:  Well developed, well nourished woman sitting comfortably in the exam room in no acute distress. MENTAL STATUS:  Alert and oriented to person, place and time. HEAD:  Wearing a light silver blue cap.  Alopecia.  Normocephalic, atraumatic, face symmetric, no Cushingoid features. EYES:  Gold rimmed glasses.  Blue eyes.  Pupils equal round and reactive to light and accomodation.  No conjunctivitis or scleral icterus.  ENT: Oropharynx clear without lesion. Tongue normal. Mucous membranes moist.  RESPIRATORY: Decreased breath sounds right base.  No rales, wheezes or rhonchi. CARDIOVASCULAR: Regular rate and rhythm without murmur, rub or gallop. ABDOMEN: Soft, non-tender with active bowel sounds and no appreciable hepatosplenomegaly. Epigastric fullness.  2 cm mass superiorly under rim of ostomy appliance.  Insulin pump.  Ostomy bag. SKIN: Pale.  Abdominal wall scattered ecchymosis and small hematomas secondary to Lovenox.  No rashes or ulcers. EXTREMITIES:  No edema, skin discoloration or tenderness. No palpable cords. LYMPH NODES: No palpable cervical, supraclavicular, axillary or inguinal adenopathy  NEUROLOGICAL: Appropriate. PSYCH: Appropriate.   Radiology studies: 02/13/2015:  Abdomen and pelvic CT revealed bilateral mass-like adnexal regions (right adnexal mass 5.6 x 5.0 cm and the left adnexa mass 10.3 x 6.0 x 9.9 cm).  There was a large amount of soft tissue throughout the peritoneal cavity involving the omentum and other peritoneal surfaces.  There was a small volume ascites. There  was a peripheral 3.3 x 1.9 cm low-attenuation lesion overlying the right lobe of the liver, likely representing a serosal implant. There was 1.4 x 2.2 cm ill-defined peripheral lesion within the inferior aspect  of segment 6 adjacent to the ampulla in the duodenum.  04/28/2015:  Abdominal and pelvic CT revealed decreasing bilateral ovarian masses.   The left adnexal mass measured 4.0 x 6.1 cm (previously 5.0 x 8.5 cm). The right ovary measured 3.8 x 4.9 cm (previously 4.7 x 5.6 cm).  There was improved peritoneal carcinomatosis.  There was a small amount of ascites.  The hepatic dome lesion was stable. The previously seen right hepatic lobe lesion was not well-visualized.  There was a small left pleural effusion and trace right pleural effusion.  The nodular lesion at the ampulla of Vater, extending into the duodenum was stable.  There was no biliary ductal dilatation. 08/01/2015:  Rght upper extremity ultrasound revealed a near occlusive thrombus within the central portion of the right internal jugular vein and central portion of the right subclavian vein. 09/01/2015:  Chest, abdomen, and pelvic CT revealed continued decrease in perihepatic fluid collection contiguous with the right pleural space with percutaneous drain.  09/11/2015:  Chest, abdomen, and pelvic CT revealed a residual versus recurrent fluid collection posterior to the right hepatic lobe (2.6 x 1.1 x 4.0 cm).   10/13/2015:  Chest, abdomen, and pelvic CT revealed resolution of the empyema. 02/15/2016:  Chest, abdomen, and pelvic CT revealed multiple soft tissue nodules throughout the pelvis, small bowel mesenteric and peritoneum and, concerning for peritoneal metastatic disease.  There was soft tissue irregularity within the left upper quadrant.  There was mild right hydronephrosis (etiology unclear).  There was interval resolution of previously described right pleural based fluid and gas collection. There was a pleural based nodule within the  right lower hemi-thorax concerning for pleural based metastasis.  There were small bilateral pleural effusions.  There was pulmonary nodularity, predominately within the left upper lobe (metastatic or infectious/inflammatory etiology).  There was slightly increased mediastinal adenopathy (infectious/inflammatory or metastatic).  There was a low attenuation lesion within the left hepatic lobe (complicated fluid within the fissure or metastatic disease). 03/08/2016:  Abdomen and pelvic CT revealed mild progression of peritoneal disease in the abdomen.  There was interval increase in loculated fluid around the lateral segment left liver, stomach, and spleen.  There was persistent soft tissue lesion at the level of the ampulla with mild intra and extrahepatic biliary duct dilatation.  There was persistent intrahepatic and capsular metastatic disease involving the liver. 05/26/2016:  Head MRI revealed multiple small areas of acute infarct involving the occipital parietal lobe bilaterally and the left lateral cerebellum,  consistent with posterior circulation emboli.  06/10/2016:  Adomen and pelvic CT revealed progression of peritoneal carcinomatosis predominantly in the perihepatic space, bilateral lower quadrants and pelvis.  There was worsening bilateral obstructive uropathy due to malignant involvement of the pelvic ureters.  There was stable small pleural/subpleural nodules at the right lung base.  There was stable small left pleural effusion.  There was decreased small volume perihepatic ascites.  08/09/2016:  Abdomen and pelvic CT revealed interval increase and loculated fluid collections within the peritoneal space consistent with progression of peritoneal ovarian carcinoma metastasis.  There was one large 7 cm collection in the RIGHT lower quadrant which had increased significantly in size and may represent of point of small bowel obstruction.  The proximal stomach and duodenum were decompressed. The mid  small bowel was mildly dilated. There was concern for obstruction given the large intraperitoneal fluid collection.  There was increase in subcapsular hepatic metastatic fluid collections.  There was enlargement of rounded ampullary lesion in the second portion  duodenum concerning for metastatic lesion.  There was mild biliary duct dilatation (stable).  There was nodular pleural metastasis in the RIGHT lower lobe pleural space. 11/03/2016:  Abdomen and pelvic CT revealed multifocal cystic metastases in the abdomen/pelvis, reflecting peritoneal disease.  The dominant lesion in the left anterior abdomen has progressed, while additional lesions were mixed.  Peritoneal disease along the liver and spleen progressed.  Pleural-based metastases along the posterior right hemithorax progressed. There were trace left pleural effusion.  There was a stable 1.7 cm duodenal lesion at the ampulla. 11/29/2016:  Abdomen and pelvic CT revealed mild interval enlargement of pleural metastasis the RIGHT lower lobe.  There was overall mild decrease in cystic peritoneal metastasis within the abdomen pelvis. The solid lesion at the terminal ileum was decreased in size. Subcapsular lesion in the liver was slightly decreased in size.  There was no evidence of disease progression the abdomen pelvis.  There was mild intrahepatic duct dilatation similar prior.  The ampullary lesion was slightly larger.  There was mild hydronephrosis and hydroureter on the LEFT likely related to cystic metastasis at the LEFT vesicoureteral junction 12/09/2016:  MRCP revealed the cystic lesion within the pancreatic head/uncinate process had decreased in size and demonstrated no suspicious characteristics. This can be presumed a pseudocyst or focus of side branch duct ectasia.  There was similar to slight progression of extensive peritoneal metastasis since 11/29/2016. Grossly similar abdominal nodal and right pleural metastasis.  There was chronic mild common  duct dilatation with similar soft tissue fullness about the ampulla compared to 11/29/2016.  There was improved to resolved left-sided hydronephrosis. 01/20/2017:  Abdomen and pelvic CT revealed complex mixed interval changes in widespread metastatic disease in the peritoneal cavity, lower chest and thoracic, abdominal and pelvic lymph nodes. Right lower chest pleural metastasis and several of the peritoneal tumor implants had increased.  There was new right inguinal lymphadenopathy.  Some of the peritoneal tumor implants had decreased.  The small dependent left pleural effusion was slightly increased .  The mild left hydroureteronephrosis was improved.  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   The chronic biliary ductal dilatation was stable .  There was no evidence of bowel obstruction. 05/22/2017:  Chest, abdomen, and pelvic CT revealed  significant enlargement of metastatic disease involving the right pleura, low paratracheal nodal chain, omentum, peritoneal surfaces, mesentery, and liver. Tumors extending through the ostomy site into the subcutaneous tissues. There were multiple new metastatic liver and right pleural lesions.  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  There was new sclerotic lesion in the T11 vertebral body, concerning for possible metastatic disease. There was healing anterior nondisplaced rib fractures of the right second through ninth ribs that appeared new.   Admissions: [facility] from 06/15/2015 - 06/22/2015.  She underwent exploratory laparotomy, lysis of adhesions, total abdominal hysterectomy with bilateral salpingo-oophorectomy, infracolic omentectomy, optimal tumor debulking(< 1 cm), recto-sigmoid resection with creation of end colostomy, cholecystectomy, mobilization of splenic flexure and liver with diaphragmatic stripping on 06/15/2015. The right  diaphragm was cleared of tumor. During dissection, the diaphragm was entered and closed with sutures.  [place] from 07/01/2015 - 07/03/2015 with pleural effusion. [place] from 07/07/2015 - 07/09/2015 with recurrent pleural effusion. [place] from 07/29/2015 - 08/10/2015 with a right-sided empyema and liver abscess. She underwent CT-guided placement of a liver abscess drain on 07/30/2015.  Liver abscess culture grew out group B strep  and Enterobacter which was sensitive to Zosyn. She was transitioned to ertapenem Colbert Ewing) prior to discharge.  She was readmitted to [facility] on 09/17/2015. [place] from 05/26/2016 - 05/28/2016 with altered mental status and an acute embolic CVA.  Head MRI on 05/26/2016 revealed multiple small areas of acute infarct involving the occipital parietal lobe bilaterally and the left lateral cerebellum,  consistent with posterior circulation emboli.  Work-up included a negative carotid ultrasound and echocardiogram. [facility] from 08/10/2016 - 08/14/2016 with a small bowel obstruction.  She was managed conservatively. [place] from 09/27/2016 - 09/28/2016 with symptomatic anemia and diarrhea.   Appointment on 06/26/2017  Component Date Value Ref Range Status  . Magnesium 06/26/2017 1.5* 1.7 - 2.4 mg/dL Final   Performed at [facility], [address] [place], [place] [address]  . Sodium 06/26/2017 137  135 - 145 mmol/L Final  . Potassium 06/26/2017 3.5  3.5 - 5.1 mmol/L Final  . Chloride 06/26/2017 103  101 - 111 mmol/L Final  . CO2 06/26/2017 27  22 - 32 mmol/L Final  . Glucose, Bld 06/26/2017 166* 65 - 99 mg/dL Final  . BUN 06/26/2017 22* 6 - 20 mg/dL Final  . Creatinine, Ser 06/26/2017 0.87  0.44 - 1.00 mg/dL Final  . Calcium 06/26/2017 9.6  8.9 - 10.3 mg/dL Final  . Total Protein 06/26/2017 6.9  6.5 - 8.1 g/dL Final  . Albumin 06/26/2017 3.4* 3.5 - 5.0 g/dL Final  . AST 06/26/2017 16  15 - 41 U/L Final  . ALT 06/26/2017 9* 14 - 54 U/L Final  . Alkaline Phosphatase 06/26/2017 117   38 - 126 U/L Final  . Total Bilirubin 06/26/2017 0.6  0.3 - 1.2 mg/dL Final  . GFR calc non Af Amer 06/26/2017 >60  >60 mL/min Final  . GFR calc Af Amer 06/26/2017 >60  >60 mL/min Final   Comment: (NOTE) The eGFR has been calculated using the CKD EPI equation. This calculation has not been validated in all clinical situations. eGFR's persistently <60 mL/min signify possible Chronic Kidney Disease.   Georgiann Hahn gap 06/26/2017 7  5 - 15 Final   Performed at [facility], [address] [place], [place] [address]  . WBC 06/26/2017 9.3  3.6 - 11.0 K/uL Final  . RBC 06/26/2017 2.50* 3.80 - 5.20 MIL/uL Final  . Hemoglobin 06/26/2017 7.6* 12.0 - 16.0 g/dL Final  . HCT 06/26/2017 22.8* 35.0 - 47.0 % Final  . MCV 06/26/2017 91.1  80.0 - 100.0 fL Final  . MCH 06/26/2017 30.3  26.0 - 34.0 pg Final  . MCHC 06/26/2017 33.2  32.0 - 36.0 g/dL Final  . RDW 06/26/2017 20.4* 11.5 - 14.5 % Final  . Platelets 06/26/2017 285  150 - 440 K/uL Final  . Neutrophils Relative % 06/26/2017 82  % Final  . Neutro Abs 06/26/2017 7.7* 1.4 - 6.5 K/uL Final  . Lymphocytes Relative 06/26/2017 9  % Final  . Lymphs Abs 06/26/2017 0.8* 1.0 - 3.6 K/uL Final  . Monocytes Relative 06/26/2017 8  % Final  . Monocytes Absolute 06/26/2017 0.7  0.2 - 0.9 K/uL Final  . Eosinophils Relative 06/26/2017 0  % Final  . Eosinophils Absolute 06/26/2017 0.0  0 - 0.7 K/uL Final  . Basophils Relative 06/26/2017 1  % Final  . Basophils Absolute 06/26/2017 0.1  0 - 0.1 K/uL Final   Performed at [facility], [address] [place], [place] [address]    Assessment:  [name] is a  72 y.o. female with progressive stage IV ovarian cancer.  She presented with abdominal discomfort and bloating.  Omental biopsy on 02/23/2015 revealed metastatic high grade serous carcinoma, consistent with gynecologic origin.   She was initially diagnosed with clinical stage IIIC (T3cN1Mx).  CA125 was 707 on 02/17/2015.  She received 4 cycles  of neoadjuvant carboplatin and Taxol (03/05/2015 - 05/22/2015).  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  She had loose stools on oral magnesium.  She was initially on Neurontin then switched Lyrica with cycle #3.  Cycle #4 was notable for neutropenia (ANC 300) requiring GCSF x 3 days.    She underwent exploratory laparotomy, lysis of adhesions, total abdominal hysterectomy with bilateral salpingo-oophorectomy, infracolic omentectomy, optimal tumor debulking(< 1 cm), recto-sigmoid resection with creation of end colostomy, cholecystectomy, mobilization of splenic flexure and liver with diaphragmatic stripping on 06/15/2015. The right diaphragm was cleared of tumor. During dissection, the diaphragm was entered and closed with sutures.  She received tamoxifen from 02/25/2016 - 03/14/2016.  She received 4 cycles of carboplatin and gemcitabine (03/21/2016 - 05/24/2016) with GCSF/Neulasta support.  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  Cymbalta began on 02/24/2017  PDL-1 testing revealed a combined score was 5 (>=1 positive).     [facility] genetic testing negative for APC, ATM, BMPR1A, BRCA1, BRCA2, BRIP1, CDH1, CDK4, CDKN2A, CHEK2, EPCAM, MLH1, MSH2, MSH6, MUTYH, NBN, PALB2, PMS2, PTEN, RAD51C, RAD51D, SMAD4, STK11, TP53. Sequencing for select regions of POLE and POLD1, and large rearrangement analysis for select regions of GREM1 were negative.  She received 2 cycles of Doxil (06/21/2016 - 07/19/2016) with Neulasta support.  She received 4 cycles of topotecan with Neulasta support (08/22/2016 - 10/10/2016).    She received 3 cycles of pembrolizumab (Keytruda) from 12/06/2016 -  01/09/2017.  Cycle #1 was complicated by neutropenia (ANC 400) requiring GCSF/Granix x 3 days.  She received GCSF x 5 days with cycle #2.  Abdomen and pelvic CT  on 01/20/2017 revealed complex mixed interval changes in widespread metastatic disease in the peritoneal cavity, lower chest and thoracic,  abdominal and pelvic lymph nodes. Right lower chest pleural metastasis and several of the peritoneal tumor implants had increased.  There was new right inguinal lymphadenopathy.  Some of the peritoneal tumor implants had decreased.  The small dependent left pleural effusion was slightly increased .  The mild left hydroureteronephrosis was improved.  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   The chronic biliary ductal dilatation was stable .  There was no evidence of bowel obstruction.  CA125 was 802.9 on 03/30/2015, 567.9 on 04/13/2015, 168.8 on 05/15/2015, 85.2 on 07/17/2015, 68.6 on 07/28/2015, 34.5 on 09/17/2015, 20.7 on 11/06/2015, 49 on 01/22/2016, 106.1 on 02/22/2016, 138.8 on 03/07/2016, 220.8 on 03/21/2016, 152.8 on 04/11/2016, 125.6 on 04/18/2016, 76.8 on 05/03/2016, 60.2 on 05/24/2016, 44 on 07/19/2016, 65.8 on 08/17/2016, 53.4 on 09/12/2016, 47.3 on 10/10/2016, 91 on 12/20/2016, 141.1 on 01/09/2017, 463.7 on 02/27/2017, 907.1 on 03/27/2017, 192.1 on 05/22/2017, 189.1 on 05/24/2017, and 144.3 on 06/19/2017.  She has a history of recurrent right sided pleural effusion.  She underwent thoracentesis of 650 cc on post-operative day 3.  She was admitted to [facility] on 07/01/2015 and 07/07/2015 for recurrent shortness of breath.  She underwent 2 additional thoracenteses (1.1 L on 07/02/2015 and 850 cc on 07/08/2015).  Cytology was negative x 2.  Bilateral lower extremity duplex on 07/03/2015 was negative.  Echo revealed an EF of 55-60% on 07/08/2015 and 60-65% on 05/27/2016.  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 She was on Lovenox 60 mg twice a day.  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  She has severe rheumatoid arthritis.  Methotrexate and Enbrel were initially on hold.  She has a normocytic anemia.   Work-up on 02/17/2015 and 05/24/2016 revealed a normal ferritin, B12, folate, TSH.  She denies any melena or hematochezia.    She has anemia due to chronic disease. She received 1 unit PRBCs during her admission at [facility]. She denies any melena or hematochezia. She has diabetes and is on an insulin pump.  She has had persistent neutropenia felt secondary to her rheumatoid arthritis.  Folate and MMA were normal.  TSH was 6.13 (high) with a free T4 of 1.17 (0.61-1.12).  She began methotrexate (10 mg a week) and prednisone (5 mg a day) for severe rheumatoid arthritis on 12/17/2015.  She remains on prednisone alone (5 mg a day).  Bone marrow aspirate and biopsy on 06/09/2010 revealed a hypercellular marrow (70%) with no evidence of dysplasia or malignancy.  Flow cytometry was negative.  Cytogenetics were normal (46,XX).  FISH studies were negative for MDS.  Bone marrow aspirate and biopsy on 12/03/2015 revealed a normocellular to mildy hypercellular marrow for age (40%) with left shifted myelopoiesis, non specific dyserythropoiesis and mild megakaryocytic atypia with no increase in blasts.  There were multiple small nonspecific lymphoid aggregates (favor reactive). There was no increase in reticulin.  There was decreased myeloid cells (37%) with left shifted maturation and 1% atypical myelod blasts.  There was relatively increased monocytic cells (11%), relatively increased lymphoid cells (36%), and relatively increased eosinophils (6%).  Cytogenetics were normal (47, XX).  SNP microarray was normal.  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  She receives IV magnesium twice weekly.    She has chemotherapy induced anemia.  She has received Procrit (last 11/07/2016).  She received 1 unit of PRBCs on 06/05/2017.  Labs on 08/22/2016 revealed a normal B12 and folate.  Ferritin was 201, iron saturation 21% and TIBC 307 on 06/09/2017.  B12 and folate were normal.  Code status is DNR/DNI.  She  developed acute LFT elevation on 11/28/2016.  Event was preceded by upper abdominal discomfort.  Hepatitis B core antibody total and hepatitis C antibody were negative.  She is on day 1 of cycle #5 Abraxane (03/13/2017 - 06/26/2017).  Cycle #1 was truncated after day 1 secondary to side effects and Thanksgiving.  Chest, abdomen, and pelvic CT on 05/22/2017 revealed  significant enlargement of metastatic disease involving the right pleura, low paratracheal nodal chain, omentum, peritoneal surfaces, mesentery, and liver. Tumors extending through the ostomy site into the subcutaneous tissues. There were multiple new metastatic liver and right pleural lesions.  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  There was new sclerotic lesion in the T11 vertebral body, concerning for possible metastatic disease. There was healing anterior nondisplaced rib fractures of the right second through ninth ribs that appeared new.  Symptomatically, she feels "ok".  She has nausea in the morning and some constipation. She has a stable grade II neuropathy.  Exam is stable.  WBC is 9300 with an [place] of 7700. Hemoglobin is 7.6, hematocrit 22.8, and platelets 285,000.  Magnesium is 1.5 (low).   Plan: 1.  Labs today:  CBC with diff, CMP, Mg. 2.  Dscuss interval CA125- declining.  Discuss continuation of therapy and imaging after this cycle.  Discuss CA125 may not reflect disease.  Discuss consideration of imaging now or after 1 additional cycle.  No other chemotherapy options available.  Patient wishes to continue treatment until scan in 1 month.  If clinical change during the month, discuss early CT scan.  Hospice briefly discussed. 3.  Magnesium low at 1.5. Will replace with 6 grams of IV magnesium.  4.  Discuss anemia. Progressive chemotherapy induced anemia noted. Hemoglobin 7.6. Discuss potential need for  potential PRBC transfusion. Patient consenting to transfusion on 06/27/2017.  Procrit injection today and every 2 weeks. 5.  Discuss abdominal discomfort and constipation. Will add daily laxative and gaviscon. 6.  Labs reviewed. Blood counts stable and adequate for treatment. Proceed with day 1 of cycle #5 Abraxane.  7.  RTC on 02/27, 02/28, 03/01 for GCSF. 8.  RTC on 03/01 for labs (BMP, Mg) and IV Mg. 9.  RTC on 03/04 for MD assessment, labs (CBC with diff, CMP, Mg), day 8 of cycle #5 Abraxane, and IV Mg. 10.  RTC on 03/06, 03/07, 03/08 for GCSF. 11.  RTC on 03/08 for labs (BMP, Mg) and IV Mg.   Honor Loh, NP  06/26/2017, 9:04 AM   I saw and evaluated the patient, participating in the key portions of the service and reviewing pertinent diagnostic studies and records.  I reviewed the nurse practitioner's note and agree with the findings and the plan.  The assessment and plan were discussed with the patient.  Several questions were asked by the patient and answered.   Nolon Stalls, MD 06/26/2017,9:04 AM

## 2017-06-26 ENCOUNTER — Encounter: Payer: Self-pay | Admitting: Hematology and Oncology

## 2017-06-26 ENCOUNTER — Inpatient Hospital Stay: Payer: Medicare Other

## 2017-06-26 ENCOUNTER — Other Ambulatory Visit: Payer: Self-pay | Admitting: Urgent Care

## 2017-06-26 ENCOUNTER — Inpatient Hospital Stay: Payer: Medicare Other | Admitting: Hematology and Oncology

## 2017-06-26 VITALS — BP 119/65 | HR 96 | Temp 98.0°F | Resp 18 | Wt 130.3 lb

## 2017-06-26 DIAGNOSIS — C569 Malignant neoplasm of unspecified ovary: Secondary | ICD-10-CM

## 2017-06-26 DIAGNOSIS — R0602 Shortness of breath: Secondary | ICD-10-CM

## 2017-06-26 DIAGNOSIS — Z801 Family history of malignant neoplasm of trachea, bronchus and lung: Secondary | ICD-10-CM

## 2017-06-26 DIAGNOSIS — Z5111 Encounter for antineoplastic chemotherapy: Secondary | ICD-10-CM

## 2017-06-26 DIAGNOSIS — I509 Heart failure, unspecified: Secondary | ICD-10-CM

## 2017-06-26 DIAGNOSIS — D6481 Anemia due to antineoplastic chemotherapy: Secondary | ICD-10-CM

## 2017-06-26 DIAGNOSIS — T451X5A Adverse effect of antineoplastic and immunosuppressive drugs, initial encounter: Secondary | ICD-10-CM

## 2017-06-26 DIAGNOSIS — Z7189 Other specified counseling: Secondary | ICD-10-CM

## 2017-06-26 DIAGNOSIS — Z79899 Other long term (current) drug therapy: Secondary | ICD-10-CM | POA: Diagnosis not present

## 2017-06-26 DIAGNOSIS — R110 Nausea: Secondary | ICD-10-CM

## 2017-06-26 DIAGNOSIS — C786 Secondary malignant neoplasm of retroperitoneum and peritoneum: Secondary | ICD-10-CM

## 2017-06-26 DIAGNOSIS — C801 Malignant (primary) neoplasm, unspecified: Secondary | ICD-10-CM

## 2017-06-26 DIAGNOSIS — K8020 Calculus of gallbladder without cholecystitis without obstruction: Secondary | ICD-10-CM

## 2017-06-26 DIAGNOSIS — M899 Disorder of bone, unspecified: Secondary | ICD-10-CM

## 2017-06-26 DIAGNOSIS — C561 Malignant neoplasm of right ovary: Secondary | ICD-10-CM

## 2017-06-26 DIAGNOSIS — C787 Secondary malignant neoplasm of liver and intrahepatic bile duct: Secondary | ICD-10-CM

## 2017-06-26 DIAGNOSIS — E109 Type 1 diabetes mellitus without complications: Secondary | ICD-10-CM

## 2017-06-26 DIAGNOSIS — I10 Essential (primary) hypertension: Secondary | ICD-10-CM

## 2017-06-26 DIAGNOSIS — Z86718 Personal history of other venous thrombosis and embolism: Secondary | ICD-10-CM

## 2017-06-26 DIAGNOSIS — Z90722 Acquired absence of ovaries, bilateral: Secondary | ICD-10-CM

## 2017-06-26 DIAGNOSIS — E785 Hyperlipidemia, unspecified: Secondary | ICD-10-CM

## 2017-06-26 DIAGNOSIS — M069 Rheumatoid arthritis, unspecified: Secondary | ICD-10-CM

## 2017-06-26 DIAGNOSIS — Z87891 Personal history of nicotine dependence: Secondary | ICD-10-CM

## 2017-06-26 DIAGNOSIS — K219 Gastro-esophageal reflux disease without esophagitis: Secondary | ICD-10-CM

## 2017-06-26 DIAGNOSIS — K5900 Constipation, unspecified: Secondary | ICD-10-CM | POA: Diagnosis not present

## 2017-06-26 DIAGNOSIS — C800 Disseminated malignant neoplasm, unspecified: Principal | ICD-10-CM

## 2017-06-26 DIAGNOSIS — R5383 Other fatigue: Secondary | ICD-10-CM | POA: Diagnosis not present

## 2017-06-26 DIAGNOSIS — Z90710 Acquired absence of both cervix and uterus: Secondary | ICD-10-CM

## 2017-06-26 DIAGNOSIS — G620 Drug-induced polyneuropathy: Secondary | ICD-10-CM

## 2017-06-26 DIAGNOSIS — I2510 Atherosclerotic heart disease of native coronary artery without angina pectoris: Secondary | ICD-10-CM

## 2017-06-26 DIAGNOSIS — I252 Old myocardial infarction: Secondary | ICD-10-CM

## 2017-06-26 DIAGNOSIS — R918 Other nonspecific abnormal finding of lung field: Secondary | ICD-10-CM

## 2017-06-26 DIAGNOSIS — Z8673 Personal history of transient ischemic attack (TIA), and cerebral infarction without residual deficits: Secondary | ICD-10-CM

## 2017-06-26 DIAGNOSIS — N131 Hydronephrosis with ureteral stricture, not elsewhere classified: Secondary | ICD-10-CM | POA: Diagnosis not present

## 2017-06-26 DIAGNOSIS — E876 Hypokalemia: Secondary | ICD-10-CM

## 2017-06-26 LAB — CBC WITH DIFFERENTIAL/PLATELET
Basophils Absolute: 0.1 10*3/uL (ref 0–0.1)
Basophils Relative: 1 %
Eosinophils Absolute: 0 10*3/uL (ref 0–0.7)
Eosinophils Relative: 0 %
HCT: 22.8 % — ABNORMAL LOW (ref 35.0–47.0)
Hemoglobin: 7.6 g/dL — ABNORMAL LOW (ref 12.0–16.0)
Lymphocytes Relative: 9 %
Lymphs Abs: 0.8 10*3/uL — ABNORMAL LOW (ref 1.0–3.6)
MCH: 30.3 pg (ref 26.0–34.0)
MCHC: 33.2 g/dL (ref 32.0–36.0)
MCV: 91.1 fL (ref 80.0–100.0)
Monocytes Absolute: 0.7 10*3/uL (ref 0.2–0.9)
Monocytes Relative: 8 %
Neutro Abs: 7.7 10*3/uL — ABNORMAL HIGH (ref 1.4–6.5)
Neutrophils Relative %: 82 %
Platelets: 285 10*3/uL (ref 150–440)
RBC: 2.5 MIL/uL — ABNORMAL LOW (ref 3.80–5.20)
RDW: 20.4 % — ABNORMAL HIGH (ref 11.5–14.5)
WBC: 9.3 10*3/uL (ref 3.6–11.0)

## 2017-06-26 LAB — COMPREHENSIVE METABOLIC PANEL
ALT: 9 U/L — ABNORMAL LOW (ref 14–54)
AST: 16 U/L (ref 15–41)
Albumin: 3.4 g/dL — ABNORMAL LOW (ref 3.5–5.0)
Alkaline Phosphatase: 117 U/L (ref 38–126)
Anion gap: 7 (ref 5–15)
BUN: 22 mg/dL — ABNORMAL HIGH (ref 6–20)
CO2: 27 mmol/L (ref 22–32)
Calcium: 9.6 mg/dL (ref 8.9–10.3)
Chloride: 103 mmol/L (ref 101–111)
Creatinine, Ser: 0.87 mg/dL (ref 0.44–1.00)
GFR calc Af Amer: >60 mL/min (ref >60)
GFR calc non Af Amer: >60 mL/min (ref >60)
Glucose, Bld: 166 mg/dL — ABNORMAL HIGH (ref 65–99)
Potassium: 3.5 mmol/L (ref 3.5–5.1)
Sodium: 137 mmol/L (ref 135–145)
Total Bilirubin: 0.6 mg/dL (ref 0.3–1.2)
Total Protein: 6.9 g/dL (ref 6.5–8.1)

## 2017-06-26 LAB — PREPARE RBC (CROSSMATCH)

## 2017-06-26 LAB — MAGNESIUM: Magnesium: 1.5 mg/dL — ABNORMAL LOW (ref 1.7–2.4)

## 2017-06-26 MED ORDER — MAGNESIUM SULFATE 50 % IJ SOLN
6.0000 g | Freq: Once | INTRAMUSCULAR | Status: AC
  Administered 2017-06-26: 6 g via INTRAVENOUS
  Filled 2017-06-26: qty 10

## 2017-06-26 MED ORDER — HEPARIN SOD (PORK) LOCK FLUSH 100 UNIT/ML IV SOLN
500.0000 [IU] | Freq: Once | INTRAVENOUS | Status: AC
  Administered 2017-06-26: 500 [IU] via INTRAVENOUS

## 2017-06-26 MED ORDER — EPOETIN ALFA 10000 UNIT/ML IJ SOLN
10000.0000 [IU] | Freq: Once | INTRAMUSCULAR | Status: AC
  Administered 2017-06-26: 10000 [IU] via SUBCUTANEOUS
  Filled 2017-06-26: qty 2

## 2017-06-26 MED ORDER — PACLITAXEL PROTEIN-BOUND CHEMO INJECTION 100 MG
80.0000 mg/m2 | Freq: Once | INTRAVENOUS | Status: AC
  Administered 2017-06-26: 125 mg via INTRAVENOUS
  Filled 2017-06-26: qty 25

## 2017-06-26 MED ORDER — ONDANSETRON HCL 4 MG PO TABS
8.0000 mg | ORAL_TABLET | Freq: Once | ORAL | Status: AC
  Administered 2017-06-26: 8 mg via ORAL
  Filled 2017-06-26: qty 2

## 2017-06-26 MED ORDER — HEPARIN SOD (PORK) LOCK FLUSH 100 UNIT/ML IV SOLN
INTRAVENOUS | Status: AC
  Filled 2017-06-26: qty 5

## 2017-06-26 MED ORDER — SODIUM CHLORIDE 0.9 % IV SOLN
Freq: Once | INTRAVENOUS | Status: AC
Start: 2017-06-26 — End: 2017-06-26
  Administered 2017-06-26: 10:00:00 via INTRAVENOUS
  Filled 2017-06-26: qty 1000

## 2017-06-26 NOTE — Progress Notes (Signed)
Patient states mornings are hard for her. She is nauseated, having discomfort in the upper part of her stomach and feeling fatigued.  She is also having a lot of problems getting her food to go down.  States "she does a lot of belching" to get it to go down.  Later in the day things seem to be better.

## 2017-06-26 NOTE — Progress Notes (Signed)
Parameters state for Mg 1.4 or less give Mg 16m. Mg today is 1.5. Verified with Gaspar Bidding that MD wants Mg6gm.

## 2017-06-27 ENCOUNTER — Inpatient Hospital Stay: Payer: Medicare Other

## 2017-06-27 DIAGNOSIS — C561 Malignant neoplasm of right ovary: Secondary | ICD-10-CM | POA: Diagnosis not present

## 2017-06-27 DIAGNOSIS — D6481 Anemia due to antineoplastic chemotherapy: Secondary | ICD-10-CM

## 2017-06-27 DIAGNOSIS — T451X5A Adverse effect of antineoplastic and immunosuppressive drugs, initial encounter: Principal | ICD-10-CM

## 2017-06-27 MED ORDER — DIPHENHYDRAMINE HCL 25 MG PO CAPS
25.0000 mg | ORAL_CAPSULE | Freq: Once | ORAL | Status: AC
  Administered 2017-06-27: 25 mg via ORAL
  Filled 2017-06-27: qty 1

## 2017-06-27 MED ORDER — HEPARIN SOD (PORK) LOCK FLUSH 100 UNIT/ML IV SOLN
500.0000 [IU] | Freq: Every day | INTRAVENOUS | Status: AC | PRN
  Administered 2017-06-27: 500 [IU]
  Filled 2017-06-27: qty 5

## 2017-06-27 MED ORDER — ACETAMINOPHEN 325 MG PO TABS
650.0000 mg | ORAL_TABLET | Freq: Once | ORAL | Status: AC
  Administered 2017-06-27: 650 mg via ORAL
  Filled 2017-06-27: qty 2

## 2017-06-27 MED ORDER — SODIUM CHLORIDE 0.9 % IV SOLN
250.0000 mL | Freq: Once | INTRAVENOUS | Status: AC
  Administered 2017-06-27: 250 mL via INTRAVENOUS
  Filled 2017-06-27: qty 250

## 2017-06-28 ENCOUNTER — Inpatient Hospital Stay: Payer: Medicare Other

## 2017-06-28 DIAGNOSIS — C561 Malignant neoplasm of right ovary: Secondary | ICD-10-CM | POA: Diagnosis not present

## 2017-06-28 LAB — TYPE AND SCREEN
ABO/RH(D): A POS
ANTIBODY SCREEN: NEGATIVE
Unit division: 0

## 2017-06-28 LAB — BPAM RBC
BLOOD PRODUCT EXPIRATION DATE: 201903212359
ISSUE DATE / TIME: 201902261342
Unit Type and Rh: 6200

## 2017-06-28 MED ORDER — TBO-FILGRASTIM 300 MCG/0.5ML ~~LOC~~ SOSY
300.0000 ug | PREFILLED_SYRINGE | Freq: Once | SUBCUTANEOUS | Status: AC
  Administered 2017-06-28: 300 ug via SUBCUTANEOUS

## 2017-06-29 ENCOUNTER — Inpatient Hospital Stay: Payer: Medicare Other

## 2017-06-29 DIAGNOSIS — C561 Malignant neoplasm of right ovary: Secondary | ICD-10-CM | POA: Diagnosis not present

## 2017-06-29 MED ORDER — TBO-FILGRASTIM 300 MCG/0.5ML ~~LOC~~ SOSY
300.0000 ug | PREFILLED_SYRINGE | Freq: Once | SUBCUTANEOUS | Status: AC
  Administered 2017-06-29: 300 ug via SUBCUTANEOUS

## 2017-06-30 ENCOUNTER — Inpatient Hospital Stay: Payer: Medicare Other | Attending: Hematology and Oncology

## 2017-06-30 ENCOUNTER — Inpatient Hospital Stay: Payer: Medicare Other

## 2017-06-30 VITALS — BP 117/66 | HR 82 | Temp 98.6°F | Resp 18

## 2017-06-30 DIAGNOSIS — Z794 Long term (current) use of insulin: Secondary | ICD-10-CM | POA: Insufficient documentation

## 2017-06-30 DIAGNOSIS — E109 Type 1 diabetes mellitus without complications: Secondary | ICD-10-CM | POA: Diagnosis not present

## 2017-06-30 DIAGNOSIS — Z8673 Personal history of transient ischemic attack (TIA), and cerebral infarction without residual deficits: Secondary | ICD-10-CM | POA: Insufficient documentation

## 2017-06-30 DIAGNOSIS — D6481 Anemia due to antineoplastic chemotherapy: Secondary | ICD-10-CM | POA: Diagnosis not present

## 2017-06-30 DIAGNOSIS — R110 Nausea: Secondary | ICD-10-CM | POA: Diagnosis not present

## 2017-06-30 DIAGNOSIS — C786 Secondary malignant neoplasm of retroperitoneum and peritoneum: Secondary | ICD-10-CM | POA: Insufficient documentation

## 2017-06-30 DIAGNOSIS — K219 Gastro-esophageal reflux disease without esophagitis: Secondary | ICD-10-CM | POA: Insufficient documentation

## 2017-06-30 DIAGNOSIS — R0602 Shortness of breath: Secondary | ICD-10-CM | POA: Diagnosis not present

## 2017-06-30 DIAGNOSIS — I2510 Atherosclerotic heart disease of native coronary artery without angina pectoris: Secondary | ICD-10-CM | POA: Diagnosis not present

## 2017-06-30 DIAGNOSIS — Z5111 Encounter for antineoplastic chemotherapy: Secondary | ICD-10-CM | POA: Insufficient documentation

## 2017-06-30 DIAGNOSIS — Z9641 Presence of insulin pump (external) (internal): Secondary | ICD-10-CM | POA: Insufficient documentation

## 2017-06-30 DIAGNOSIS — I252 Old myocardial infarction: Secondary | ICD-10-CM | POA: Insufficient documentation

## 2017-06-30 DIAGNOSIS — J90 Pleural effusion, not elsewhere classified: Secondary | ICD-10-CM | POA: Diagnosis not present

## 2017-06-30 DIAGNOSIS — R6881 Early satiety: Secondary | ICD-10-CM | POA: Insufficient documentation

## 2017-06-30 DIAGNOSIS — Z86718 Personal history of other venous thrombosis and embolism: Secondary | ICD-10-CM | POA: Insufficient documentation

## 2017-06-30 DIAGNOSIS — T451X5S Adverse effect of antineoplastic and immunosuppressive drugs, sequela: Secondary | ICD-10-CM | POA: Diagnosis not present

## 2017-06-30 DIAGNOSIS — I509 Heart failure, unspecified: Secondary | ICD-10-CM | POA: Insufficient documentation

## 2017-06-30 DIAGNOSIS — Z90710 Acquired absence of both cervix and uterus: Secondary | ICD-10-CM | POA: Insufficient documentation

## 2017-06-30 DIAGNOSIS — R310 Gross hematuria: Secondary | ICD-10-CM | POA: Diagnosis not present

## 2017-06-30 DIAGNOSIS — N1330 Unspecified hydronephrosis: Secondary | ICD-10-CM | POA: Diagnosis not present

## 2017-06-30 DIAGNOSIS — M7989 Other specified soft tissue disorders: Secondary | ICD-10-CM | POA: Diagnosis not present

## 2017-06-30 DIAGNOSIS — Z87891 Personal history of nicotine dependence: Secondary | ICD-10-CM | POA: Insufficient documentation

## 2017-06-30 DIAGNOSIS — K222 Esophageal obstruction: Secondary | ICD-10-CM | POA: Insufficient documentation

## 2017-06-30 DIAGNOSIS — R231 Pallor: Secondary | ICD-10-CM | POA: Diagnosis not present

## 2017-06-30 DIAGNOSIS — Z90722 Acquired absence of ovaries, bilateral: Secondary | ICD-10-CM | POA: Insufficient documentation

## 2017-06-30 DIAGNOSIS — C800 Disseminated malignant neoplasm, unspecified: Principal | ICD-10-CM

## 2017-06-30 DIAGNOSIS — E785 Hyperlipidemia, unspecified: Secondary | ICD-10-CM | POA: Insufficient documentation

## 2017-06-30 DIAGNOSIS — R1012 Left upper quadrant pain: Secondary | ICD-10-CM | POA: Insufficient documentation

## 2017-06-30 DIAGNOSIS — C561 Malignant neoplasm of right ovary: Secondary | ICD-10-CM | POA: Insufficient documentation

## 2017-06-30 DIAGNOSIS — M069 Rheumatoid arthritis, unspecified: Secondary | ICD-10-CM | POA: Diagnosis not present

## 2017-06-30 DIAGNOSIS — I10 Essential (primary) hypertension: Secondary | ICD-10-CM | POA: Insufficient documentation

## 2017-06-30 DIAGNOSIS — Z9221 Personal history of antineoplastic chemotherapy: Secondary | ICD-10-CM | POA: Insufficient documentation

## 2017-06-30 LAB — BASIC METABOLIC PANEL
Anion gap: 6 (ref 5–15)
BUN: 17 mg/dL (ref 6–20)
CO2: 28 mmol/L (ref 22–32)
Calcium: 9 mg/dL (ref 8.9–10.3)
Chloride: 102 mmol/L (ref 101–111)
Creatinine, Ser: 0.78 mg/dL (ref 0.44–1.00)
GFR calc Af Amer: >60 mL/min (ref >60)
GFR calc non Af Amer: >60 mL/min (ref >60)
Glucose, Bld: 165 mg/dL — ABNORMAL HIGH (ref 65–99)
Potassium: 3.7 mmol/L (ref 3.5–5.1)
Sodium: 136 mmol/L (ref 135–145)

## 2017-06-30 LAB — MAGNESIUM: Magnesium: 1.4 mg/dL — ABNORMAL LOW (ref 1.7–2.4)

## 2017-06-30 MED ORDER — TBO-FILGRASTIM 300 MCG/0.5ML ~~LOC~~ SOSY
300.0000 ug | PREFILLED_SYRINGE | Freq: Once | SUBCUTANEOUS | Status: AC
  Administered 2017-06-30: 300 ug via SUBCUTANEOUS
  Filled 2017-06-30: qty 0.5

## 2017-06-30 MED ORDER — HEPARIN SOD (PORK) LOCK FLUSH 100 UNIT/ML IV SOLN
500.0000 [IU] | Freq: Once | INTRAVENOUS | Status: AC
  Administered 2017-06-30: 500 [IU] via INTRAVENOUS

## 2017-06-30 MED ORDER — SODIUM CHLORIDE 0.9 % IV SOLN
Freq: Once | INTRAVENOUS | Status: AC
  Administered 2017-06-30: 09:00:00 via INTRAVENOUS
  Filled 2017-06-30: qty 1000

## 2017-06-30 MED ORDER — HEPARIN SOD (PORK) LOCK FLUSH 100 UNIT/ML IV SOLN
500.0000 [IU] | Freq: Once | INTRAVENOUS | Status: DC
  Filled 2017-06-30: qty 5

## 2017-06-30 MED ORDER — SODIUM CHLORIDE 0.9% FLUSH
10.0000 mL | Freq: Once | INTRAVENOUS | Status: AC
  Administered 2017-06-30: 10 mL via INTRAVENOUS
  Filled 2017-06-30: qty 10

## 2017-06-30 MED ORDER — MAGNESIUM SULFATE 50 % IJ SOLN
6.0000 g | Freq: Once | INTRAMUSCULAR | Status: AC
  Administered 2017-06-30: 6 g via INTRAVENOUS
  Filled 2017-06-30: qty 12

## 2017-06-30 NOTE — Progress Notes (Signed)
Per order parameters pt to receive 6 grams of magnesium of magnesium lever of 1.4.

## 2017-07-03 ENCOUNTER — Inpatient Hospital Stay: Payer: Medicare Other

## 2017-07-03 ENCOUNTER — Inpatient Hospital Stay: Payer: Medicare Other | Admitting: Hematology and Oncology

## 2017-07-03 ENCOUNTER — Encounter: Payer: Self-pay | Admitting: Hematology and Oncology

## 2017-07-03 VITALS — BP 109/64 | HR 83 | Temp 96.8°F | Wt 130.1 lb

## 2017-07-03 DIAGNOSIS — Z9641 Presence of insulin pump (external) (internal): Secondary | ICD-10-CM

## 2017-07-03 DIAGNOSIS — M069 Rheumatoid arthritis, unspecified: Secondary | ICD-10-CM

## 2017-07-03 DIAGNOSIS — D6481 Anemia due to antineoplastic chemotherapy: Secondary | ICD-10-CM | POA: Diagnosis not present

## 2017-07-03 DIAGNOSIS — T451X5A Adverse effect of antineoplastic and immunosuppressive drugs, initial encounter: Secondary | ICD-10-CM

## 2017-07-03 DIAGNOSIS — E109 Type 1 diabetes mellitus without complications: Secondary | ICD-10-CM | POA: Diagnosis not present

## 2017-07-03 DIAGNOSIS — R110 Nausea: Secondary | ICD-10-CM

## 2017-07-03 DIAGNOSIS — C569 Malignant neoplasm of unspecified ovary: Secondary | ICD-10-CM

## 2017-07-03 DIAGNOSIS — C800 Disseminated malignant neoplasm, unspecified: Principal | ICD-10-CM

## 2017-07-03 DIAGNOSIS — C801 Malignant (primary) neoplasm, unspecified: Secondary | ICD-10-CM

## 2017-07-03 DIAGNOSIS — R0602 Shortness of breath: Secondary | ICD-10-CM

## 2017-07-03 DIAGNOSIS — I10 Essential (primary) hypertension: Secondary | ICD-10-CM

## 2017-07-03 DIAGNOSIS — C786 Secondary malignant neoplasm of retroperitoneum and peritoneum: Secondary | ICD-10-CM

## 2017-07-03 DIAGNOSIS — R6881 Early satiety: Secondary | ICD-10-CM | POA: Diagnosis not present

## 2017-07-03 DIAGNOSIS — Z87891 Personal history of nicotine dependence: Secondary | ICD-10-CM

## 2017-07-03 DIAGNOSIS — E785 Hyperlipidemia, unspecified: Secondary | ICD-10-CM

## 2017-07-03 DIAGNOSIS — T451X5S Adverse effect of antineoplastic and immunosuppressive drugs, sequela: Secondary | ICD-10-CM | POA: Diagnosis not present

## 2017-07-03 DIAGNOSIS — I509 Heart failure, unspecified: Secondary | ICD-10-CM

## 2017-07-03 DIAGNOSIS — C561 Malignant neoplasm of right ovary: Secondary | ICD-10-CM

## 2017-07-03 DIAGNOSIS — J90 Pleural effusion, not elsewhere classified: Secondary | ICD-10-CM

## 2017-07-03 DIAGNOSIS — Z7189 Other specified counseling: Secondary | ICD-10-CM

## 2017-07-03 DIAGNOSIS — N1330 Unspecified hydronephrosis: Secondary | ICD-10-CM | POA: Diagnosis not present

## 2017-07-03 DIAGNOSIS — Z9221 Personal history of antineoplastic chemotherapy: Secondary | ICD-10-CM

## 2017-07-03 DIAGNOSIS — K222 Esophageal obstruction: Secondary | ICD-10-CM

## 2017-07-03 DIAGNOSIS — Z8673 Personal history of transient ischemic attack (TIA), and cerebral infarction without residual deficits: Secondary | ICD-10-CM

## 2017-07-03 DIAGNOSIS — Z5111 Encounter for antineoplastic chemotherapy: Secondary | ICD-10-CM | POA: Diagnosis not present

## 2017-07-03 DIAGNOSIS — Z794 Long term (current) use of insulin: Secondary | ICD-10-CM

## 2017-07-03 DIAGNOSIS — Z90710 Acquired absence of both cervix and uterus: Secondary | ICD-10-CM

## 2017-07-03 DIAGNOSIS — Z86718 Personal history of other venous thrombosis and embolism: Secondary | ICD-10-CM

## 2017-07-03 DIAGNOSIS — I2510 Atherosclerotic heart disease of native coronary artery without angina pectoris: Secondary | ICD-10-CM

## 2017-07-03 DIAGNOSIS — I252 Old myocardial infarction: Secondary | ICD-10-CM

## 2017-07-03 DIAGNOSIS — Z90722 Acquired absence of ovaries, bilateral: Secondary | ICD-10-CM

## 2017-07-03 DIAGNOSIS — K219 Gastro-esophageal reflux disease without esophagitis: Secondary | ICD-10-CM

## 2017-07-03 DIAGNOSIS — G620 Drug-induced polyneuropathy: Secondary | ICD-10-CM

## 2017-07-03 LAB — CBC WITH DIFFERENTIAL/PLATELET
Basophils Absolute: 0.1 10*3/uL (ref 0–0.1)
Basophils Relative: 1 %
Eosinophils Absolute: 0 10*3/uL (ref 0–0.7)
Eosinophils Relative: 0 %
HCT: 24.9 % — ABNORMAL LOW (ref 35.0–47.0)
Hemoglobin: 8.5 g/dL — ABNORMAL LOW (ref 12.0–16.0)
Lymphocytes Relative: 12 %
Lymphs Abs: 0.8 10*3/uL — ABNORMAL LOW (ref 1.0–3.6)
MCH: 31.5 pg (ref 26.0–34.0)
MCHC: 34.1 g/dL (ref 32.0–36.0)
MCV: 92.5 fL (ref 80.0–100.0)
Monocytes Absolute: 1 10*3/uL — ABNORMAL HIGH (ref 0.2–0.9)
Monocytes Relative: 15 %
Neutro Abs: 4.8 10*3/uL (ref 1.4–6.5)
Neutrophils Relative %: 72 %
Platelets: 289 10*3/uL (ref 150–440)
RBC: 2.69 MIL/uL — ABNORMAL LOW (ref 3.80–5.20)
RDW: 19.2 % — ABNORMAL HIGH (ref 11.5–14.5)
WBC: 6.7 10*3/uL (ref 3.6–11.0)

## 2017-07-03 LAB — COMPREHENSIVE METABOLIC PANEL
ALT: 8 U/L — ABNORMAL LOW (ref 14–54)
AST: 15 U/L (ref 15–41)
Albumin: 3.3 g/dL — ABNORMAL LOW (ref 3.5–5.0)
Alkaline Phosphatase: 112 U/L (ref 38–126)
Anion gap: 7 (ref 5–15)
BUN: 19 mg/dL (ref 6–20)
CO2: 27 mmol/L (ref 22–32)
Calcium: 9.4 mg/dL (ref 8.9–10.3)
Chloride: 102 mmol/L (ref 101–111)
Creatinine, Ser: 0.73 mg/dL (ref 0.44–1.00)
GFR calc Af Amer: >60 mL/min (ref >60)
GFR calc non Af Amer: >60 mL/min (ref >60)
Glucose, Bld: 191 mg/dL — ABNORMAL HIGH (ref 65–99)
Potassium: 3.6 mmol/L (ref 3.5–5.1)
Sodium: 136 mmol/L (ref 135–145)
Total Bilirubin: 0.5 mg/dL (ref 0.3–1.2)
Total Protein: 6.5 g/dL (ref 6.5–8.1)

## 2017-07-03 LAB — MAGNESIUM: Magnesium: 1.3 mg/dL — ABNORMAL LOW (ref 1.7–2.4)

## 2017-07-03 MED ORDER — SODIUM CHLORIDE 0.9 % IV SOLN
Freq: Once | INTRAVENOUS | Status: AC
  Administered 2017-07-03: 10:00:00 via INTRAVENOUS
  Filled 2017-07-03: qty 1000

## 2017-07-03 MED ORDER — HEPARIN SOD (PORK) LOCK FLUSH 100 UNIT/ML IV SOLN
500.0000 [IU] | Freq: Once | INTRAVENOUS | Status: AC
  Administered 2017-07-03: 500 [IU] via INTRAVENOUS
  Filled 2017-07-03: qty 5

## 2017-07-03 MED ORDER — HEPARIN SOD (PORK) LOCK FLUSH 100 UNIT/ML IV SOLN: 500.0000 [IU] | Freq: Once | INTRAVENOUS | Status: DC | PRN

## 2017-07-03 MED ORDER — ONDANSETRON HCL 4 MG PO TABS
8.0000 mg | ORAL_TABLET | Freq: Once | ORAL | Status: AC
  Administered 2017-07-03: 8 mg via ORAL
  Filled 2017-07-03: qty 2

## 2017-07-03 MED ORDER — PACLITAXEL PROTEIN-BOUND CHEMO INJECTION 100 MG
80.0000 mg/m2 | Freq: Once | INTRAVENOUS | Status: AC
  Administered 2017-07-03: 125 mg via INTRAVENOUS
  Filled 2017-07-03: qty 25

## 2017-07-03 MED ORDER — PACLITAXEL PROTEIN-BOUND CHEMO INJECTION 100 MG
80.0000 mg/m2 | Freq: Once | Status: DC
  Filled 2017-07-03: qty 25

## 2017-07-03 MED ORDER — SODIUM CHLORIDE 0.9% FLUSH
10.0000 mL | INTRAVENOUS | Status: DC | PRN
  Administered 2017-07-03: 10 mL via INTRAVENOUS
  Filled 2017-07-03: qty 10

## 2017-07-03 MED ORDER — MAGNESIUM SULFATE 50 % IJ SOLN
6.0000 g | Freq: Once | INTRAMUSCULAR | Status: AC
  Administered 2017-07-03: 6 g via INTRAVENOUS
  Filled 2017-07-03: qty 10

## 2017-07-03 NOTE — Progress Notes (Signed)
Patient offers no complaints today. 

## 2017-07-03 NOTE — Progress Notes (Signed)
[facility] day:  07/03/2017   Chief Complaint: [name] is a 72 y.o. female with stage IV ovarian cancer who is seen for review of interval scans and assessment prior to day 8 of cycle #5 Abraxane.  HPI:  The patient was last seen in the medical oncology clinic on 06/26/2017.  At that time, she felt "ok".  She had nausea in the morning and some constipation. She had a stable grade II neuropathy.  Exam was stable.  WBC was 9300 with an [place] of 7700. Hemoglobin was 7.6, hematocrit 22.8, and platelets 285,000.  Magnesium was 1.5 (low).  She received day 1 of cycle #5 Abraxane.  She received IV magnesium.  She received 3 days of GCSF (06/28/2017 - 06/30/2017) and magnesium on 06/30/2017.  She received Procrit on 06/26/2017 and 1 unit of PRBCs on 06/27/2017.  During the interim, patient continues to have "queasy" feeling in her abdomen. She has nausea, but no vomiting. Patient is taking the prescribed antiemetics. Patient is able to eat small amounts well, however notes early satiety.  Her weight is stable.   Patient denies any B symptoms and interval infections.  She has shortness of breath on exertion.  She denies any worsening neuropathy.  She is able to button buttons and zip zippers.  Patient denies pain in the clinic today.   Past Medical History:  Diagnosis Date  . CAD (coronary artery disease)    a. 09/2012 Cath: LM nl, LAD 95p, 74m LCX 954mOM2 50, RCA 100.  . Marland KitchenHF (congestive heart failure) ([facility]  . Cholelithiasis   . Collagen vascular disease (HCC)    Rheumatoid Arthritis.  . Esophageal stricture   . Exertional shortness of breath   . GERD (gastroesophageal reflux disease)   . H/O hiatal hernia   . Herniated disc   . History of pancytopenia   . Hyperlipidemia   . Hypertension   . Hypokalemia   . Leukopenia 2012   s/p bone marrow biopsy, Dr. PaMa Hillock. NSTEMI (non-ST elevated myocardial infarction) ([facility]5/2014   "mild"  (10/18/2012)  . Ovarian cancer ([facility]   chemo and hysterectomy  . Pneumonia 2013; 08/2012   "one lung; double" (10/18/2012)  . Rheumatoid arthritis(714.0)   . Type I diabetes mellitus ([facility]   "dx'd in 1957" (10/18/2012)    Past Surgical History:  Procedure Laterality Date  . ABDOMINAL HYSTERECTOMY  06/15/2015   Dx L/S, EXLAP TAH BSO omentectomy RSRx colostomy diaphragm resection stripping  . CARDIAC CATHETERIZATION  10/18/2012   "first one was today" (10/18/2012)  . CATARACT EXTRACTION W/ INTRAOCULAR LENS IMPLANT Right 2010  . CHOLECYSTECTOMY  06/15/2015   combined case with ovarian cancer debulking  . COLON SURGERY    . CORONARY ARTERY BYPASS GRAFT N/A 10/19/2012   Procedure: CORONARY ARTERY BYPASS GRAFTING (CABG);  Surgeon: StMelrose NakayamaMD;  [facility]: [facility] Service: Open Heart Surgery;  Laterality: N/A;  . ESOPHAGEAL DILATION     "3 or 4 times" (10/19/2011)  . ESOPHAGOGASTRODUODENOSCOPY  2012   Dr. IfMinna Merritts. OSTOMY    . OVARY SURGERY     removal  . PERIPHERAL VASCULAR CATHETERIZATION N/A 03/02/2015   Procedure: PoGlori Luisath Insertion;  Surgeon: JaAlgernon HuxleyMD;  [facility];  Service: Cardiovascular;  Laterality: N/A;  . TUBAL LIGATION  1970    Family History  Problem Relation Age of Onset  . Diabetes Mother   .  Arthritis Mother   . Diabetes Father   . Arthritis Father   . Aneurysm Sister        neck  . Cancer Sister        lung    Social History:  reports that she quit smoking about 23 years ago. Her smoking use included cigarettes. She has a 30.00 pack-year smoking history. she has never used smokeless tobacco. She reports that she does not drink alcohol or use drugs.  Patient is a retired Art therapist. Patient denies exposure to radiation and toxins. She spends every Thursday afternoon with her sister.  Her son is [name] and daughter is [name].  She is accompanied by her husband today.  Allergies:  Allergies  Allergen Reactions  .  Codeine Nausea And Vomiting  . Latex Rash    Current Medications: Current Outpatient Medications  Medication Sig Dispense Refill  . Calcium-Vitamin D 600-200 MG-UNIT tablet Take 1 tablet by mouth 2 (two) times daily.     . cholecalciferol (VITAMIN D) 1000 units tablet Take 1,000 Units by mouth daily.    . DULoxetine (CYMBALTA) 20 MG capsule Take 1 capsule (20 mg total) by mouth daily. 90 capsule 2  . enoxaparin (LOVENOX) 60 MG/0.6ML injection INJECT 0.6ML (60MG TOTAL) UNDER THE SKIN EVERY 12 HOURS 36 mL 1  . folic acid (FOLVITE) 1 MG tablet Take 1 tablet (1 mg total) by mouth daily. 90 tablet 3  . furosemide (LASIX) 40 MG tablet Take 1 tablet (40 mg total) by mouth daily as needed for edema. 30 tablet 1  . insulin lispro (HUMALOG) 100 UNIT/ML injection Inject 0.06 mLs (6 Units total) into the skin daily. (Patient taking differently: Inject 0-85 Units into the skin daily. Via insulin pump) 30 mL 3  . lidocaine-prilocaine (EMLA) cream APPLY 1 APPLICATION TOPICALLY AS NEEDED 1 HOUR PRIOR TO TREATMENT. COVER WITH PRESS N SEAL UNTIL TREATMENT TIME AS DIRECTED 30 g 1  . loratadine (CLARITIN) 10 MG tablet Take 1 tablet by mouth daily as needed.    . magnesium oxide (MAG-OX) 400 (241.3 Mg) MG tablet TAKE 1 TABLET BY MOUTH ONCE DAILY 30 tablet 1  . metoprolol tartrate (LOPRESSOR) 25 MG tablet Take 0.5 tablets (12.5 mg total) by mouth daily after breakfast. 45 tablet 3  . ondansetron (ZOFRAN) 8 MG tablet TAKE 1 TABLET BY MOUTH EVERY 8 HOURS AS NEEDED FOR NAUSEA AND VOMITING 30 tablet 1  . pantoprazole (PROTONIX) 40 MG tablet TAKE 1 TABLET BY MOUTH TWICE (2) DAILY 180 tablet 1  . potassium chloride SA (K-DUR,KLOR-CON) 20 MEQ tablet TAKE 1 TABLET BY MOUTH ONCE A DAY 90 tablet 1  . predniSONE (DELTASONE) 5 MG tablet Take 5 mg by mouth daily with breakfast.    . vitamin C (ASCORBIC ACID) 500 MG tablet Take 500 mg by mouth daily.    Marland Kitchen zinc gluconate 50 MG tablet Take 50 mg daily by mouth.     No current  facility-administered medications for this visit.    Facility-Administered Medications Ordered in Other Visits  Medication Dose Route Frequency Provider Last Rate Last Dose  . 0.9 %  sodium chloride infusion   Intravenous Continuous Lequita Asal, MD   Stopped at 04/24/17 1140  . heparin lock flush 100 unit/mL  500 Units Intravenous Once Corcoran, Melissa C, MD      . magnesium sulfate IVPB 4 g 100 mL  4 g Intravenous Once Faythe Casa E, NP      . sodium chloride 0.9 %  injection 10 mL  10 mL Intracatheter PRN Corcoran, Melissa C, MD      . sodium chloride 0.9 % injection 10 mL  10 mL Intravenous PRN Lequita Asal, MD   10 mL at 04/13/15 0852  . sodium chloride flush (NS) 0.9 % injection 10 mL  10 mL Intravenous PRN Lequita Asal, MD   10 mL at 07/03/17 0815    Review of Systems:  GENERAL:  Feels "ok".  Fatigue.  No fever, chills or sweats.  Weight stable. PERFORMANCE STATUS (ECOG):  1-2. HEENT:  No visual changes, sore throat, mouth sores or tenderness. Lungs:  Shortness of breath on exertion.  No cough.  No hemoptysis. Cardiac:  No chest pain, palpitations, orthopnea, or PND. GI:  Tightness in epigastric area once in awhile.  Eating small amounts.  Belching.  Nausea in AM, well managed.  No vomiting, diarrhea, melena or hematochezia. GU:  No urgency, frequency, dysuria, or hematuria. Musculoskeletal:  Back pain.  Severe rheumatoid arthritis (on prednisone).  Osteoporosis.  No back pain. No muscle tenderness. Extremities:  No pain or swelling. Skin:  No increased bruising or bleeding.  No rashes or skin changes. Neuro:  Neuropathy in hands and feet (see HPI).  No headache, numbness or weakness, balance or coordination issues. Endocrine:  Diabetes on an insulin pump.  Blood sugar elevated on steroids.  Occasional hot flashes.  Psych:  No mood changes, depression or anxiety.  Sleeping more. Pain:  No focal pain.  Review of systems:  All other systems reviewed and found  to be negative.  Physical Exam:  Blood pressure 109/64, pulse 83, temperature (!) 96.8 F (36 C), temperature source Tympanic, weight 130 lb 1 oz (59 kg). GENERAL:  Well developed, well nourished woman sitting comfortably in the exam room in no acute distress. MENTAL STATUS:  Alert and oriented to person, place and time. HEAD:  Wearing a light silver blue cap.  Alopecia.  Normocephalic, atraumatic, face symmetric, no Cushingoid features. EYES:  Gold rimmed glasses.  Blue eyes.  Pupils equal round and reactive to light and accomodation.  No conjunctivitis or scleral icterus.  ENT: Oropharynx clear without lesion. Tongue normal. Mucous membranes moist.  RESPIRATORY: Decreased breath sounds right base.  No rales, wheezes or rhonchi. CARDIOVASCULAR: Regular rate and rhythm without murmur, rub or gallop. ABDOMEN: Soft, non-tender with active bowel sounds and no appreciable hepatosplenomegaly. Epigastric fullness.  2.5 cm mass superiorly under rim of ostomy appliance.  Insulin pump.  Ostomy bag. SKIN: Pale.  Abdominal wall scattered ecchymosis and small hematomas secondary to Lovenox.  No rashes or ulcers. EXTREMITIES:  No edema, skin discoloration or tenderness. No palpable cords. LYMPH NODES: No palpable cervical, supraclavicular, axillary or inguinal adenopathy  NEUROLOGICAL: Appropriate. PSYCH: Appropriate.   Radiology studies: 02/13/2015:  Abdomen and pelvic CT revealed bilateral mass-like adnexal regions (right adnexal mass 5.6 x 5.0 cm and the left adnexa mass 10.3 x 6.0 x 9.9 cm).  There was a large amount of soft tissue throughout the peritoneal cavity involving the omentum and other peritoneal surfaces.  There was a small volume ascites. There was a peripheral 3.3 x 1.9 cm low-attenuation lesion overlying the right lobe of the liver, likely representing a serosal implant. There was 1.4 x 2.2 cm ill-defined peripheral lesion within the inferior aspect of segment 6 adjacent to the  ampulla in the duodenum.  04/28/2015:  Abdominal and pelvic CT revealed decreasing bilateral ovarian masses.   The left adnexal mass measured 4.0 x 6.1  cm (previously 5.0 x 8.5 cm). The right ovary measured 3.8 x 4.9 cm (previously 4.7 x 5.6 cm).  There was improved peritoneal carcinomatosis.  There was a small amount of ascites.  The hepatic dome lesion was stable. The previously seen right hepatic lobe lesion was not well-visualized.  There was a small left pleural effusion and trace right pleural effusion.  The nodular lesion at the ampulla of Vater, extending into the duodenum was stable.  There was no biliary ductal dilatation. 08/01/2015:  Rght upper extremity ultrasound revealed a near occlusive thrombus within the central portion of the right internal jugular vein and central portion of the right subclavian vein. 09/01/2015:  Chest, abdomen, and pelvic CT revealed continued decrease in perihepatic fluid collection contiguous with the right pleural space with percutaneous drain.  09/11/2015:  Chest, abdomen, and pelvic CT revealed a residual versus recurrent fluid collection posterior to the right hepatic lobe (2.6 x 1.1 x 4.0 cm).   10/13/2015:  Chest, abdomen, and pelvic CT revealed resolution of the empyema. 02/15/2016:  Chest, abdomen, and pelvic CT revealed multiple soft tissue nodules throughout the pelvis, small bowel mesenteric and peritoneum and, concerning for peritoneal metastatic disease.  There was soft tissue irregularity within the left upper quadrant.  There was mild right hydronephrosis (etiology unclear).  There was interval resolution of previously described right pleural based fluid and gas collection. There was a pleural based nodule within the right lower hemi-thorax concerning for pleural based metastasis.  There were small bilateral pleural effusions.  There was pulmonary nodularity, predominately within the left upper lobe (metastatic or infectious/inflammatory etiology).   There was slightly increased mediastinal adenopathy (infectious/inflammatory or metastatic).  There was a low attenuation lesion within the left hepatic lobe (complicated fluid within the fissure or metastatic disease). 03/08/2016:  Abdomen and pelvic CT revealed mild progression of peritoneal disease in the abdomen.  There was interval increase in loculated fluid around the lateral segment left liver, stomach, and spleen.  There was persistent soft tissue lesion at the level of the ampulla with mild intra and extrahepatic biliary duct dilatation.  There was persistent intrahepatic and capsular metastatic disease involving the liver. 05/26/2016:  Head MRI revealed multiple small areas of acute infarct involving the occipital parietal lobe bilaterally and the left lateral cerebellum,  consistent with posterior circulation emboli.  06/10/2016:  Adomen and pelvic CT revealed progression of peritoneal carcinomatosis predominantly in the perihepatic space, bilateral lower quadrants and pelvis.  There was worsening bilateral obstructive uropathy due to malignant involvement of the pelvic ureters.  There was stable small pleural/subpleural nodules at the right lung base.  There was stable small left pleural effusion.  There was decreased small volume perihepatic ascites.  08/09/2016:  Abdomen and pelvic CT revealed interval increase and loculated fluid collections within the peritoneal space consistent with progression of peritoneal ovarian carcinoma metastasis.  There was one large 7 cm collection in the RIGHT lower quadrant which had increased significantly in size and may represent of point of small bowel obstruction.  The proximal stomach and duodenum were decompressed. The mid small bowel was mildly dilated. There was concern for obstruction given the large intraperitoneal fluid collection.  There was increase in subcapsular hepatic metastatic fluid collections.  There was enlargement of rounded ampullary lesion  in the second portion duodenum concerning for metastatic lesion.  There was mild biliary duct dilatation (stable).  There was nodular pleural metastasis in the RIGHT lower lobe pleural space. 11/03/2016:  Abdomen and pelvic CT  revealed multifocal cystic metastases in the abdomen/pelvis, reflecting peritoneal disease.  The dominant lesion in the left anterior abdomen has progressed, while additional lesions were mixed.  Peritoneal disease along the liver and spleen progressed.  Pleural-based metastases along the posterior right hemithorax progressed. There were trace left pleural effusion.  There was a stable 1.7 cm duodenal lesion at the ampulla. 11/29/2016:  Abdomen and pelvic CT revealed mild interval enlargement of pleural metastasis the RIGHT lower lobe.  There was overall mild decrease in cystic peritoneal metastasis within the abdomen pelvis. The solid lesion at the terminal ileum was decreased in size. Subcapsular lesion in the liver was slightly decreased in size.  There was no evidence of disease progression the abdomen pelvis.  There was mild intrahepatic duct dilatation similar prior.  The ampullary lesion was slightly larger.  There was mild hydronephrosis and hydroureter on the LEFT likely related to cystic metastasis at the LEFT vesicoureteral junction 12/09/2016:  MRCP revealed the cystic lesion within the pancreatic head/uncinate process had decreased in size and demonstrated no suspicious characteristics. This can be presumed a pseudocyst or focus of side branch duct ectasia.  There was similar to slight progression of extensive peritoneal metastasis since 11/29/2016. Grossly similar abdominal nodal and right pleural metastasis.  There was chronic mild common duct dilatation with similar soft tissue fullness about the ampulla compared to 11/29/2016.  There was improved to resolved left-sided hydronephrosis. 01/20/2017:  Abdomen and pelvic CT revealed complex mixed interval changes in widespread  metastatic disease in the peritoneal cavity, lower chest and thoracic, abdominal and pelvic lymph nodes. Right lower chest pleural metastasis and several of the peritoneal tumor implants had increased.  There was new right inguinal lymphadenopathy.  Some of the peritoneal tumor implants had decreased.  The small dependent left pleural effusion was slightly increased .  The mild left hydroureteronephrosis was improved.  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   The chronic biliary ductal dilatation was stable .  There was no evidence of bowel obstruction. 05/22/2017:  Chest, abdomen, and pelvic CT revealed  significant enlargement of metastatic disease involving the right pleura, low paratracheal nodal chain, omentum, peritoneal surfaces, mesentery, and liver. Tumors extending through the ostomy site into the subcutaneous tissues. There were multiple new metastatic liver and right pleural lesions.  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  There was new sclerotic lesion in the T11 vertebral body, concerning for possible metastatic disease. There was healing anterior nondisplaced rib fractures of the right second through ninth ribs that appeared new.   Admissions: [facility] from 06/15/2015 - 06/22/2015.  She underwent exploratory laparotomy, lysis of adhesions, total abdominal hysterectomy with bilateral salpingo-oophorectomy, infracolic omentectomy, optimal tumor debulking(< 1 cm), recto-sigmoid resection with creation of end colostomy, cholecystectomy, mobilization of splenic flexure and liver with diaphragmatic stripping on 06/15/2015. The right diaphragm was cleared of tumor. During dissection, the diaphragm was entered and closed with sutures.  [facility] from 07/01/2015 - 07/03/2015 with pleural effusion. [place] from 07/07/2015 - 07/09/2015 with recurrent pleural effusion. [facility] from  07/29/2015 - 08/10/2015 with a right-sided empyema and liver abscess. She underwent CT-guided placement of a liver abscess drain on 07/30/2015.  Liver abscess culture grew out group B strep and Enterobacter which was sensitive to Zosyn. She was transitioned to ertapenem Colbert Ewing) prior to discharge.  She was readmitted to [facility] on 09/17/2015. [facility] from 05/26/2016 - 05/28/2016 with altered mental  status and an acute embolic CVA.  Head MRI on 05/26/2016 revealed multiple small areas of acute infarct involving the occipital parietal lobe bilaterally and the left lateral cerebellum,  consistent with posterior circulation emboli.  Work-up included a negative carotid ultrasound and echocardiogram. [facility] from 08/10/2016 - 08/14/2016 with a small bowel obstruction.  She was managed conservatively. [facility] from 09/27/2016 - 09/28/2016 with symptomatic anemia and diarrhea.   Infusion on 07/03/2017  Component Date Value Ref Range Status  . Magnesium 07/03/2017 1.3* 1.7 - 2.4 mg/dL Final   Performed at [facility], [address], [place], [place] [address]  . Sodium 07/03/2017 136  135 - 145 mmol/L Final  . Potassium 07/03/2017 3.6  3.5 - 5.1 mmol/L Final  . Chloride 07/03/2017 102  101 - 111 mmol/L Final  . CO2 07/03/2017 27  22 - 32 mmol/L Final  . Glucose, Bld 07/03/2017 191* 65 - 99 mg/dL Final  . BUN 07/03/2017 19  6 - 20 mg/dL Final  . Creatinine, Ser 07/03/2017 0.73  0.44 - 1.00 mg/dL Final  . Calcium 07/03/2017 9.4  8.9 - 10.3 mg/dL Final  . Total Protein 07/03/2017 6.5  6.5 - 8.1 g/dL Final  . Albumin 07/03/2017 3.3* 3.5 - 5.0 g/dL Final  . AST 07/03/2017 15  15 - 41 U/L Final  . ALT 07/03/2017 8* 14 - 54 U/L Final  . Alkaline Phosphatase 07/03/2017 112  38 - 126 U/L Final  . Total Bilirubin 07/03/2017 0.5  0.3 - 1.2 mg/dL Final  . GFR calc non Af Amer 07/03/2017 >60  >60 mL/min Final  . GFR calc Af Amer 07/03/2017 >60  >60 mL/min Final   Comment: (NOTE) The eGFR has been calculated using  the CKD EPI equation. This calculation has not been validated in all clinical situations. eGFR's persistently <60 mL/min signify possible Chronic Kidney Disease.   Georgiann Hahn gap 07/03/2017 7  5 - 15 Final   Performed at [facility], [address] [place], [place] [address]  . WBC 07/03/2017 6.7  3.6 - 11.0 K/uL Final  . RBC 07/03/2017 2.69* 3.80 - 5.20 MIL/uL Final  . Hemoglobin 07/03/2017 8.5* 12.0 - 16.0 g/dL Final  . HCT 07/03/2017 24.9* 35.0 - 47.0 % Final  . MCV 07/03/2017 92.5  80.0 - 100.0 fL Final  . MCH 07/03/2017 31.5  26.0 - 34.0 pg Final  . MCHC 07/03/2017 34.1  32.0 - 36.0 g/dL Final  . RDW 07/03/2017 19.2* 11.5 - 14.5 % Final  . Platelets 07/03/2017 289  150 - 440 K/uL Final  . Neutrophils Relative % 07/03/2017 72  % Final  . Neutro Abs 07/03/2017 4.8  1.4 - 6.5 K/uL Final  . Lymphocytes Relative 07/03/2017 12  % Final  . Lymphs Abs 07/03/2017 0.8* 1.0 - 3.6 K/uL Final  . Monocytes Relative 07/03/2017 15  % Final  . Monocytes Absolute 07/03/2017 1.0* 0.2 - 0.9 K/uL Final  . Eosinophils Relative 07/03/2017 0  % Final  . Eosinophils Absolute 07/03/2017 0.0  0 - 0.7 K/uL Final  . Basophils Relative 07/03/2017 1  % Final  . Basophils Absolute 07/03/2017 0.1  0 - 0.1 K/uL Final   Performed at [facility], [address], [place],  [address]    Assessment:  DAMICA GRAVLIN is a 72 y.o. female with progressive stage IV ovarian cancer.  She presented with abdominal discomfort and bloating.  Omental biopsy on 02/23/2015 revealed metastatic high grade serous carcinoma, consistent with gynecologic  origin.   She was initially diagnosed with clinical stage IIIC (T3cN1Mx).  CA125 was 707 on 02/17/2015.  She received 4 cycles of neoadjuvant carboplatin and Taxol (03/05/2015 - 05/22/2015).  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  She had loose stools on oral magnesium.  She was initially on Neurontin then switched Lyrica with cycle #3.  Cycle #4 was  notable for neutropenia (ANC 300) requiring GCSF x 3 days.    She underwent exploratory laparotomy, lysis of adhesions, total abdominal hysterectomy with bilateral salpingo-oophorectomy, infracolic omentectomy, optimal tumor debulking(< 1 cm), recto-sigmoid resection with creation of end colostomy, cholecystectomy, mobilization of splenic flexure and liver with diaphragmatic stripping on 06/15/2015. The right diaphragm was cleared of tumor. During dissection, the diaphragm was entered and closed with sutures.  She received tamoxifen from 02/25/2016 - 03/14/2016.  She received 4 cycles of carboplatin and gemcitabine (03/21/2016 - 05/24/2016) with GCSF/Neulasta support.  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  Cymbalta began on 02/24/2017  PDL-1 testing revealed a combined score was 5 (>=1 positive).     [facility] genetic testing negative for APC, ATM, BMPR1A, BRCA1, BRCA2, BRIP1, CDH1, CDK4, CDKN2A, CHEK2, EPCAM, MLH1, MSH2, MSH6, MUTYH, NBN, PALB2, PMS2, PTEN, RAD51C, RAD51D, SMAD4, STK11, TP53. Sequencing for select regions of POLE and POLD1, and large rearrangement analysis for select regions of GREM1 were negative.  She received 2 cycles of Doxil (06/21/2016 - 07/19/2016) with Neulasta support.  She received 4 cycles of topotecan with Neulasta support (08/22/2016 - 10/10/2016).    She received 3 cycles of pembrolizumab (Keytruda) from 12/06/2016 -  01/09/2017.  Cycle #1 was complicated by neutropenia (ANC 400) requiring GCSF/Granix x 3 days.  She received GCSF x 5 days with cycle #2.  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 Right lower chest pleural metastasis and several of the peritoneal tumor implants had increased.  There was new right inguinal lymphadenopathy.  Some of the peritoneal tumor implants had decreased.  The  small dependent left pleural effusion was slightly increased .  The mild left hydroureteronephrosis was improved.  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   The chronic biliary ductal dilatation was stable .  There was no evidence of bowel obstruction.  CA125 was 802.9 on 03/30/2015, 567.9 on 04/13/2015, 168.8 on 05/15/2015, 85.2 on 07/17/2015, 68.6 on 07/28/2015, 34.5 on 09/17/2015, 20.7 on 11/06/2015, 49 on 01/22/2016, 106.1 on 02/22/2016, 138.8 on 03/07/2016, 220.8 on 03/21/2016, 152.8 on 04/11/2016, 125.6 on 04/18/2016, 76.8 on 05/03/2016, 60.2 on 05/24/2016, 44 on 07/19/2016, 65.8 on 08/17/2016, 53.4 on 09/12/2016, 47.3 on 10/10/2016, 91 on 12/20/2016, 141.1 on 01/09/2017, 463.7 on 02/27/2017, 907.1 on 03/27/2017, 192.1 on 05/22/2017, 189.1 on 05/24/2017, and 144.3 on 06/19/2017.  She has a history of recurrent right sided pleural effusion.  She underwent thoracentesis of 650 cc on post-operative day 3.  She was admitted to [facility] on 07/01/2015 and 07/07/2015 for recurrent shortness of breath.  She underwent 2 additional thoracenteses (1.1 L on 07/02/2015 and 850 cc on 07/08/2015).  Cytology was negative x 2.  Bilateral lower extremity duplex on 07/03/2015 was negative.  Echo revealed an EF of 55-60% on 07/08/2015 and 60-65% on 05/27/2016.    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 She  was on Lovenox 60 mg twice a day.  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  She has severe rheumatoid arthritis.  Methotrexate and Enbrel were initially on hold.  She has a normocytic anemia.  Work-up on 02/17/2015 and 05/24/2016 revealed a normal ferritin, B12, folate, TSH.  She denies any melena or hematochezia.    She has anemia due to chronic disease. She received 1 unit PRBCs during her admission at [facility]. She denies any  melena or hematochezia. She has diabetes and is on an insulin pump.  She has had persistent neutropenia felt secondary to her rheumatoid arthritis.  Folate and MMA were normal.  TSH was 6.13 (high) with a free T4 of 1.17 (0.61-1.12).  She began methotrexate (10 mg a week) and prednisone (5 mg a day) for severe rheumatoid arthritis on 12/17/2015.  She remains on prednisone alone (5 mg a day).  Bone marrow aspirate and biopsy on 06/09/2010 revealed a hypercellular marrow (70%) with no evidence of dysplasia or malignancy.  Flow cytometry was negative.  Cytogenetics were normal (46,XX).  FISH studies were negative for MDS.  Bone marrow aspirate and biopsy on 12/03/2015 revealed a normocellular to mildy hypercellular marrow for age (40%) with left shifted myelopoiesis, non specific dyserythropoiesis and mild megakaryocytic atypia with no increase in blasts.  There were multiple small nonspecific lymphoid aggregates (favor reactive). There was no increase in reticulin.  There was decreased myeloid cells (37%) with left shifted maturation and 1% atypical myelod blasts.  There was relatively increased monocytic cells (11%), relatively increased lymphoid cells (36%), and relatively increased eosinophils (6%).  Cytogenetics were normal (69, XX).  SNP microarray was normal.  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  She receives IV magnesium twice weekly.    She has chemotherapy induced anemia.  She has received Procrit (last 06/26/2017).  She received 1 unit of PRBCs on 06/05/2017 and 06/27/2017.  Labs on 08/22/2016 revealed a normal B12 and folate.  Ferritin was 201, iron saturation 21% and TIBC 307 on 06/09/2017.  B12 and folate were normal.  Code status is DNR/DNI.  She developed acute LFT elevation on 11/28/2016.  Event was preceded by upper abdominal discomfort.  Hepatitis B core antibody total and hepatitis C antibody were negative.  She is on day 8 of cycle #5 Abraxane (03/13/2017 -  06/26/2017).  Cycle #1 was truncated after day 1 secondary to side effects and Thanksgiving.  Chest, abdomen, and pelvic CT on 05/22/2017 revealed  significant enlargement of metastatic disease involving the right pleura, low paratracheal nodal chain, omentum, peritoneal surfaces, mesentery, and liver. Tumors extending through the ostomy site into the subcutaneous tissues. There were multiple new metastatic liver and right pleural lesions.  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  There was new sclerotic lesion in the T11 vertebral body, concerning for possible metastatic disease. There was healing anterior nondisplaced rib fractures of the right second through ninth ribs that appeared new.  Symptomatically, she feels "ok".  She has nausea in the morning and some constipation. She has a stable grade II neuropathy.  Exam is stable.  WBC is 6700 with an [place] of 4800. Hemoglobin is 8.5, hematocrit 24.9, and platelets 289,000.  Magnesium is 1.3 (low).   Plan: 1.  Labs today:  CBC with diff, CMP, Mg. 2.  Dscuss interval CA125- declining.  Discuss continuation of  therapy and imaging after this cycle.  Discuss CA125 may not reflect disease. No other chemotherapy options available.  Discuss imaging after completion of this cycle. 3.  Magnesium low at 1.3. Will replace with 6 grams of IV magnesium.  4.  Discuss anemia. Progressive chemotherapy induced anemia noted. Hemoglobin 8.5. Continue Procrit injection every 2 weeks. 5.  Labs reviewed. Blood counts stable and adequate for treatment. Proceed with day 8 of cycle #5 Abraxane.  6.  RTC on 03/06, 03/07, 03/08 for GCSF. 7.  RTC on 03/08 for labs (BMP, Mg) and IV Mg. 8.  RTC on 03/11 for MD assessment, labs (CBC with diff, CMP, Mg), day 15 of cycle #5 Abraxane, IV Mg, Procrit, and OnPro Neulasta. 9.  RTC on 03/15 for labs (BMP, Mg),  IV  Mg. 10.  Schedule CT chest, abdomen, and pelvis on 07/21/2017. 11.  RTC on 03/18 and 03/22 for labs (Mg) and IV Mg 12.  RTC on 03/25 for for MD assessment, labs (CBC with diff, CMP, Mg), day 1 of cycle #6 Abraxane,  IV Mg, and Procrit.   Honor Loh, NP  07/03/2017, 9:27 AM   I saw and evaluated the patient, participating in the key portions of the service and reviewing pertinent diagnostic studies and records.  I reviewed the nurse practitioner's note and agree with the findings and the plan.  The assessment and plan were discussed with the patient.  Several questions were asked by the patient and answered.   Nolon Stalls, MD 07/03/2017,9:27 AM

## 2017-07-05 ENCOUNTER — Inpatient Hospital Stay: Payer: Medicare Other

## 2017-07-05 DIAGNOSIS — C561 Malignant neoplasm of right ovary: Secondary | ICD-10-CM | POA: Diagnosis not present

## 2017-07-05 MED ORDER — TBO-FILGRASTIM 300 MCG/0.5ML ~~LOC~~ SOSY
300.0000 ug | PREFILLED_SYRINGE | Freq: Once | SUBCUTANEOUS | Status: AC
  Administered 2017-07-05: 300 ug via SUBCUTANEOUS
  Filled 2017-07-05: qty 0.5

## 2017-07-06 ENCOUNTER — Inpatient Hospital Stay: Payer: Medicare Other

## 2017-07-06 DIAGNOSIS — C561 Malignant neoplasm of right ovary: Secondary | ICD-10-CM | POA: Diagnosis not present

## 2017-07-06 MED ORDER — TBO-FILGRASTIM 300 MCG/0.5ML ~~LOC~~ SOSY
300.0000 ug | PREFILLED_SYRINGE | Freq: Once | SUBCUTANEOUS | Status: AC
  Administered 2017-07-06: 300 ug via SUBCUTANEOUS

## 2017-07-07 ENCOUNTER — Inpatient Hospital Stay: Payer: Medicare Other

## 2017-07-07 ENCOUNTER — Other Ambulatory Visit: Payer: Self-pay | Admitting: Urgent Care

## 2017-07-07 ENCOUNTER — Encounter: Payer: Self-pay | Admitting: Hematology and Oncology

## 2017-07-07 ENCOUNTER — Inpatient Hospital Stay (HOSPITAL_BASED_OUTPATIENT_CLINIC_OR_DEPARTMENT_OTHER): Payer: Medicare Other | Admitting: Hematology and Oncology

## 2017-07-07 VITALS — BP 153/73 | HR 92 | Temp 97.0°F | Resp 20 | Wt 130.5 lb

## 2017-07-07 DIAGNOSIS — M7989 Other specified soft tissue disorders: Secondary | ICD-10-CM

## 2017-07-07 DIAGNOSIS — T451X5A Adverse effect of antineoplastic and immunosuppressive drugs, initial encounter: Secondary | ICD-10-CM

## 2017-07-07 DIAGNOSIS — C801 Malignant (primary) neoplasm, unspecified: Secondary | ICD-10-CM

## 2017-07-07 DIAGNOSIS — I10 Essential (primary) hypertension: Secondary | ICD-10-CM

## 2017-07-07 DIAGNOSIS — Z5111 Encounter for antineoplastic chemotherapy: Secondary | ICD-10-CM | POA: Diagnosis not present

## 2017-07-07 DIAGNOSIS — R1012 Left upper quadrant pain: Secondary | ICD-10-CM

## 2017-07-07 DIAGNOSIS — T451X5S Adverse effect of antineoplastic and immunosuppressive drugs, sequela: Secondary | ICD-10-CM | POA: Diagnosis not present

## 2017-07-07 DIAGNOSIS — J90 Pleural effusion, not elsewhere classified: Secondary | ICD-10-CM

## 2017-07-07 DIAGNOSIS — M069 Rheumatoid arthritis, unspecified: Secondary | ICD-10-CM

## 2017-07-07 DIAGNOSIS — E109 Type 1 diabetes mellitus without complications: Secondary | ICD-10-CM

## 2017-07-07 DIAGNOSIS — Z87891 Personal history of nicotine dependence: Secondary | ICD-10-CM

## 2017-07-07 DIAGNOSIS — D6481 Anemia due to antineoplastic chemotherapy: Secondary | ICD-10-CM

## 2017-07-07 DIAGNOSIS — Z86718 Personal history of other venous thrombosis and embolism: Secondary | ICD-10-CM

## 2017-07-07 DIAGNOSIS — I509 Heart failure, unspecified: Secondary | ICD-10-CM

## 2017-07-07 DIAGNOSIS — Z90710 Acquired absence of both cervix and uterus: Secondary | ICD-10-CM

## 2017-07-07 DIAGNOSIS — N1330 Unspecified hydronephrosis: Secondary | ICD-10-CM

## 2017-07-07 DIAGNOSIS — R0602 Shortness of breath: Secondary | ICD-10-CM

## 2017-07-07 DIAGNOSIS — Z7189 Other specified counseling: Secondary | ICD-10-CM

## 2017-07-07 DIAGNOSIS — R110 Nausea: Secondary | ICD-10-CM

## 2017-07-07 DIAGNOSIS — Z8673 Personal history of transient ischemic attack (TIA), and cerebral infarction without residual deficits: Secondary | ICD-10-CM

## 2017-07-07 DIAGNOSIS — C561 Malignant neoplasm of right ovary: Secondary | ICD-10-CM

## 2017-07-07 DIAGNOSIS — I252 Old myocardial infarction: Secondary | ICD-10-CM

## 2017-07-07 DIAGNOSIS — Z90722 Acquired absence of ovaries, bilateral: Secondary | ICD-10-CM

## 2017-07-07 DIAGNOSIS — I2510 Atherosclerotic heart disease of native coronary artery without angina pectoris: Secondary | ICD-10-CM

## 2017-07-07 DIAGNOSIS — C800 Disseminated malignant neoplasm, unspecified: Principal | ICD-10-CM

## 2017-07-07 DIAGNOSIS — R231 Pallor: Secondary | ICD-10-CM

## 2017-07-07 DIAGNOSIS — K219 Gastro-esophageal reflux disease without esophagitis: Secondary | ICD-10-CM

## 2017-07-07 DIAGNOSIS — Z794 Long term (current) use of insulin: Secondary | ICD-10-CM

## 2017-07-07 DIAGNOSIS — R6881 Early satiety: Secondary | ICD-10-CM | POA: Diagnosis not present

## 2017-07-07 DIAGNOSIS — Z9221 Personal history of antineoplastic chemotherapy: Secondary | ICD-10-CM

## 2017-07-07 DIAGNOSIS — C786 Secondary malignant neoplasm of retroperitoneum and peritoneum: Secondary | ICD-10-CM

## 2017-07-07 DIAGNOSIS — E785 Hyperlipidemia, unspecified: Secondary | ICD-10-CM

## 2017-07-07 DIAGNOSIS — K222 Esophageal obstruction: Secondary | ICD-10-CM

## 2017-07-07 DIAGNOSIS — Z9641 Presence of insulin pump (external) (internal): Secondary | ICD-10-CM

## 2017-07-07 LAB — BASIC METABOLIC PANEL
Anion gap: 5 (ref 5–15)
BUN: 21 mg/dL — ABNORMAL HIGH (ref 6–20)
CO2: 31 mmol/L (ref 22–32)
Calcium: 9.1 mg/dL (ref 8.9–10.3)
Chloride: 99 mmol/L — ABNORMAL LOW (ref 101–111)
Creatinine, Ser: 0.72 mg/dL (ref 0.44–1.00)
GFR calc Af Amer: >60 mL/min (ref >60)
GFR calc non Af Amer: >60 mL/min (ref >60)
Glucose, Bld: 127 mg/dL — ABNORMAL HIGH (ref 65–99)
Potassium: 3.6 mmol/L (ref 3.5–5.1)
Sodium: 135 mmol/L (ref 135–145)

## 2017-07-07 LAB — CBC WITH DIFFERENTIAL/PLATELET
Basophils Absolute: 0.1 10*3/uL (ref 0–0.1)
Basophils Relative: 0 %
Eosinophils Absolute: 0 10*3/uL (ref 0–0.7)
Eosinophils Relative: 0 %
HCT: 23.3 % — ABNORMAL LOW (ref 35.0–47.0)
Hemoglobin: 7.6 g/dL — ABNORMAL LOW (ref 12.0–16.0)
Lymphocytes Relative: 3 %
Lymphs Abs: 0.8 10*3/uL — ABNORMAL LOW (ref 1.0–3.6)
MCH: 30.3 pg (ref 26.0–34.0)
MCHC: 32.7 g/dL (ref 32.0–36.0)
MCV: 92.6 fL (ref 80.0–100.0)
Monocytes Absolute: 0.3 10*3/uL (ref 0.2–0.9)
Monocytes Relative: 1 %
Neutro Abs: 23.9 10*3/uL — ABNORMAL HIGH (ref 1.4–6.5)
Neutrophils Relative %: 96 %
Platelets: 280 10*3/uL (ref 150–440)
RBC: 2.51 MIL/uL — ABNORMAL LOW (ref 3.80–5.20)
RDW: 19.5 % — ABNORMAL HIGH (ref 11.5–14.5)
WBC: 25.2 10*3/uL — ABNORMAL HIGH (ref 3.6–11.0)

## 2017-07-07 LAB — MAGNESIUM: Magnesium: 1.3 mg/dL — ABNORMAL LOW (ref 1.7–2.4)

## 2017-07-07 LAB — PREPARE RBC (CROSSMATCH)

## 2017-07-07 MED ORDER — SODIUM CHLORIDE 0.9% FLUSH
10.0000 mL | INTRAVENOUS | Status: DC | PRN
  Administered 2017-07-07: 10 mL via INTRAVENOUS
  Filled 2017-07-07: qty 10

## 2017-07-07 MED ORDER — HEPARIN SOD (PORK) LOCK FLUSH 100 UNIT/ML IV SOLN
500.0000 [IU] | Freq: Once | INTRAVENOUS | Status: AC
  Administered 2017-07-07: 500 [IU] via INTRAVENOUS
  Filled 2017-07-07: qty 5

## 2017-07-07 MED ORDER — SODIUM CHLORIDE 0.9 % IV SOLN
Freq: Once | INTRAVENOUS | Status: AC
  Administered 2017-07-07: 11:00:00 via INTRAVENOUS
  Filled 2017-07-07: qty 1000

## 2017-07-07 MED ORDER — MAGNESIUM SULFATE 50 % IJ SOLN
6.0000 g | Freq: Once | INTRAMUSCULAR | Status: AC
  Administered 2017-07-07: 6 g via INTRAVENOUS
  Filled 2017-07-07: qty 2

## 2017-07-07 MED ORDER — TBO-FILGRASTIM 300 MCG/0.5ML ~~LOC~~ SOSY
300.0000 ug | PREFILLED_SYRINGE | Freq: Once | SUBCUTANEOUS | Status: AC
  Administered 2017-07-07: 300 ug via SUBCUTANEOUS
  Filled 2017-07-07: qty 0.5

## 2017-07-07 NOTE — Progress Notes (Signed)
Magnesium: 1.3, ANC: 23.9, Hgb: 7.6 today. Patient states, "I feel fine today. I do not have any issues or symptoms. I do not want to stay for a blood transfusion today." MD, Dr. Mike Gip, and NP, Honor Loh, notified and aware. Honor Loh, NP, came to see patient at chair side. Per NP order: release orders from supportive therapy plan for Magnesium Sulfate 6g IVPB once over 3 hours and Granix 350mcg subcutaneous once and proceed with administration today. Per NP, lab work and possible blood transfusion will be added to patient's appointment on Monday, 07/10/17. Patient notified and aware.

## 2017-07-07 NOTE — Progress Notes (Signed)
[facility] day:  07/07/2017   Chief Complaint: [name] is a 72 y.o. female with stage IV ovarian cancer who is seen for sick call visit on day 12 of cycle #5 Abraxane.  HPI:  The patient was last seen in the medical oncology clinic on 07/03/2017.  At that time, she denied any acute changes.  Decision was made to proceed with day 8 of cycle #5 Abraxane.  Symptomatically, she has been having a "burning" sensation to her abdomen. She notes that when she lies down, she has a "pulling" or "tearing" sensation in her LEFT upper quadrant.  Her stoma is protruding more.  She appreciates peri-stomal firmness, mainly along the inferior boarder.  Stoma is not as "pink and pretty". Stoma is described as "pale pink" that has a "whitish tint" on the side.  When she lies down, her pain markedly intensifies in her LEFT upper abdomen. Bowel movements have been more formed and firm ("balls of stool").   She is pale in appearance. She has been having more swelling in her legs.  Patient took a dose of her PRN furosemide on Tuesday of this week, which has improved her peripheral edema.  She notes nausea in the morning after medications.  Weight is stable. Patient rates pain 3/10 today in the clinic.    Past Medical History:  Diagnosis Date  . CAD (coronary artery disease)    a. 09/2012 Cath: LM nl, LAD 95p, 55m LCX 932mOM2 50, RCA 100.  . Marland KitchenHF (congestive heart failure) ([place]  . Cholelithiasis   . Collagen vascular disease (HCC)    Rheumatoid Arthritis.  . Esophageal stricture   . Exertional shortness of breath   . GERD (gastroesophageal reflux disease)   . H/O hiatal hernia   . Herniated disc   . History of pancytopenia   . Hyperlipidemia   . Hypertension   . Hypokalemia   . Leukopenia 2012   s/p bone marrow biopsy, Dr. PaMa Hillock. NSTEMI (non-ST elevated myocardial infarction) (HCE[place]5/2014   "mild" (10/18/2012)  . Ovarian cancer (H[place]2016   chemo and  hysterectomy  . Pneumonia 2013; 08/2012   "one lung; double" (10/18/2012)  . Rheumatoid arthritis(714.0)   . Type I diabetes mellitus ([place]   "dx'd in 1957" (10/18/2012)    Past Surgical History:  Procedure Laterality Date  . ABDOMINAL HYSTERECTOMY  06/15/2015   Dx L/S, EXLAP TAH BSO omentectomy RSRx colostomy diaphragm resection stripping  . CARDIAC CATHETERIZATION  10/18/2012   "first one was today" (10/18/2012)  . CATARACT EXTRACTION W/ INTRAOCULAR LENS IMPLANT Right 2010  . CHOLECYSTECTOMY  06/15/2015   combined case with ovarian cancer debulking  . COLON SURGERY    . CORONARY ARTERY BYPASS GRAFT N/A 10/19/2012   Procedure: CORONARY ARTERY BYPASS GRAFTING (CABG);  Surgeon: StMelrose NakayamaMD;  [facility] Service: Open Heart Surgery;  Laterality: N/A;  . ESOPHAGEAL DILATION     "3 or 4 times" (10/19/2011)  . ESOPHAGOGASTRODUODENOSCOPY  2012   Dr. IfMinna Merritts. OSTOMY    . OVARY SURGERY     removal  . PERIPHERAL VASCULAR CATHETERIZATION N/A 03/02/2015   Procedure: PoGlori Luisath Insertion;  Surgeon: JaAlgernon HuxleyMD;  [facility];  Service: Cardiovascular;  Laterality: N/A;  . TUBAL LIGATION  1970    Family History  Problem Relation Age of Onset  . Diabetes Mother   . Arthritis Mother   .  Diabetes Father   . Arthritis Father   . Aneurysm Sister        neck  . Cancer Sister        lung    Social History:  reports that she quit smoking about 23 years ago. Her smoking use included cigarettes. She has a 30.00 pack-year smoking history. she has never used smokeless tobacco. She reports that she does not drink alcohol or use drugs.  Patient is a retired Art therapist. Patient denies exposure to radiation and toxins. She spends every Thursday afternoon with her sister.  Her son is [name] and daughter is [name].  She is accompanied by her husband today.  Allergies:  Allergies  Allergen Reactions  . Codeine Nausea And Vomiting  . Latex Rash    Current  Medications: Current Outpatient Medications  Medication Sig Dispense Refill  . Calcium-Vitamin D 600-200 MG-UNIT tablet Take 1 tablet by mouth 2 (two) times daily.     . cholecalciferol (VITAMIN D) 1000 units tablet Take 1,000 Units by mouth daily.    . DULoxetine (CYMBALTA) 20 MG capsule Take 1 capsule (20 mg total) by mouth daily. 90 capsule 2  . enoxaparin (LOVENOX) 60 MG/0.6ML injection INJECT 0.6ML (60MG TOTAL) UNDER THE SKIN EVERY 12 HOURS 36 mL 1  . folic acid (FOLVITE) 1 MG tablet Take 1 tablet (1 mg total) by mouth daily. 90 tablet 3  . furosemide (LASIX) 40 MG tablet Take 1 tablet (40 mg total) by mouth daily as needed for edema. 30 tablet 1  . insulin lispro (HUMALOG) 100 UNIT/ML injection Inject 0.06 mLs (6 Units total) into the skin daily. (Patient taking differently: Inject 0-85 Units into the skin daily. Via insulin pump) 30 mL 3  . lidocaine-prilocaine (EMLA) cream APPLY 1 APPLICATION TOPICALLY AS NEEDED 1 HOUR PRIOR TO TREATMENT. COVER WITH PRESS N SEAL UNTIL TREATMENT TIME AS DIRECTED 30 g 1  . loratadine (CLARITIN) 10 MG tablet Take 1 tablet by mouth daily as needed.    . magnesium oxide (MAG-OX) 400 (241.3 Mg) MG tablet TAKE 1 TABLET BY MOUTH ONCE DAILY 30 tablet 1  . metoprolol tartrate (LOPRESSOR) 25 MG tablet Take 0.5 tablets (12.5 mg total) by mouth daily after breakfast. 45 tablet 3  . ondansetron (ZOFRAN) 8 MG tablet TAKE 1 TABLET BY MOUTH EVERY 8 HOURS AS NEEDED FOR NAUSEA AND VOMITING 30 tablet 1  . pantoprazole (PROTONIX) 40 MG tablet TAKE 1 TABLET BY MOUTH TWICE (2) DAILY 180 tablet 1  . potassium chloride SA (K-DUR,KLOR-CON) 20 MEQ tablet TAKE 1 TABLET BY MOUTH ONCE A DAY 90 tablet 1  . predniSONE (DELTASONE) 5 MG tablet Take 5 mg by mouth daily with breakfast.    . vitamin C (ASCORBIC ACID) 500 MG tablet Take 500 mg by mouth daily.    Marland Kitchen zinc gluconate 50 MG tablet Take 50 mg daily by mouth.     No current facility-administered medications for this visit.     Facility-Administered Medications Ordered in Other Visits  Medication Dose Route Frequency Provider Last Rate Last Dose  . 0.9 %  sodium chloride infusion   Intravenous Continuous Lequita Asal, MD   Stopped at 04/24/17 1140  . magnesium sulfate IVPB 4 g 100 mL  4 g Intravenous Once Faythe Casa E, NP      . sodium chloride 0.9 % injection 10 mL  10 mL Intracatheter PRN Corcoran, Melissa C, MD      . sodium chloride 0.9 % injection 10 mL  10 mL Intravenous PRN Lequita Asal, MD   10 mL at 04/13/15 4536    Review of Systems:  GENERAL:  Feels "ok".  Fatigue.  No fever, chills or sweats.  Weight stable. PERFORMANCE STATUS (ECOG):  1-2. HEENT:  No visual changes, sore throat, mouth sores or tenderness. Lungs:  Shortness of breath on exertion.  No cough.  No hemoptysis. Cardiac:  No chest pain, palpitations, orthopnea, or PND. GI:  Burning sensation in abdomen (see HPI).  Nausea in AM, well managed.  No vomiting, diarrhea, melena or hematochezia.  Stoma change (see HPI). GU:  No urgency, frequency, dysuria, or hematuria. Musculoskeletal:  Back pain.  Severe rheumatoid arthritis (on prednisone).  Osteoporosis.  No back pain. No muscle tenderness. Extremities:  No pain or swelling. Skin:  No increased bruising or bleeding.  No rashes or skin changes. Neuro:  Neuropathy in hands and feet (no change).  No headache, numbness or weakness, balance or coordination issues. Endocrine:  Diabetes on an insulin pump.  Blood sugar elevated on steroids.  Occasional hot flashes.  Psych:  No mood changes, depression or anxiety.  Sleeping more. Pain:  No focal pain.  Review of systems:  All other systems reviewed and found to be negative.  Physical Exam:  Blood pressure (!) 153/73, pulse 92, temperature (!) 97 F (36.1 C), temperature source Tympanic, resp. rate 20, weight 130 lb 8 oz (59.2 kg). GENERAL:  Well developed, well nourished woman sitting comfortably in the exam room in no acute  distress. MENTAL STATUS:  Alert and oriented to person, place and time. HEAD:  Wearing a light silver blue cap.  Alopecia.  Normocephalic, atraumatic, face symmetric, no Cushingoid features. EYES:  Gold rimmed glasses.  Blue eyes.  No conjunctivitis or scleral icterus.  RESPIRATORY: Decreased breath sounds right base.  No rales, wheezes or rhonchi. CARDIOVASCULAR: Regular rate and rhythm without murmur, rub or gallop. ABDOMEN: Soft, non-tender with active bowel sounds and no appreciable hepatosplenomegaly. Epigastric fullness with questionable nodularity/fullnes beneath ribs.  2 cm mass superiorly under rim of ostomy appliance (no change).  Insulin pump.  Ostomy bag with several small stool balls.  Stoma is pink. SKIN: Pale.  Abdominal wall scattered ecchymosis and small hematomas secondary to Lovenox.  No rashes or ulcers. EXTREMITIES:  No edema, skin discoloration or tenderness. No palpable cords. NEUROLOGICAL: Appropriate. PSYCH: Appropriate.   Radiology studies: 02/13/2015:  Abdomen and pelvic CT revealed bilateral mass-like adnexal regions (right adnexal mass 5.6 x 5.0 cm and the left adnexa mass 10.3 x 6.0 x 9.9 cm).  There was a large amount of soft tissue throughout the peritoneal cavity involving the omentum and other peritoneal surfaces.  There was a small volume ascites. There was a peripheral 3.3 x 1.9 cm low-attenuation lesion overlying the right lobe of the liver, likely representing a serosal implant. There was 1.4 x 2.2 cm ill-defined peripheral lesion within the inferior aspect of segment 6 adjacent to the ampulla in the duodenum.  04/28/2015:  Abdominal and pelvic CT revealed decreasing bilateral ovarian masses.   The left adnexal mass measured 4.0 x 6.1 cm (previously 5.0 x 8.5 cm). The right ovary measured 3.8 x 4.9 cm (previously 4.7 x 5.6 cm).  There was improved peritoneal carcinomatosis.  There was a small amount of ascites.  The hepatic dome lesion was stable. The  previously seen right hepatic lobe lesion was not well-visualized.  There was a small left pleural effusion and trace right pleural effusion.  The nodular lesion  at the ampulla of Vater, extending into the duodenum was stable.  There was no biliary ductal dilatation. 08/01/2015:  Rght upper extremity ultrasound revealed a near occlusive thrombus within the central portion of the right internal jugular vein and central portion of the right subclavian vein. 09/01/2015:  Chest, abdomen, and pelvic CT revealed continued decrease in perihepatic fluid collection contiguous with the right pleural space with percutaneous drain.  09/11/2015:  Chest, abdomen, and pelvic CT revealed a residual versus recurrent fluid collection posterior to the right hepatic lobe (2.6 x 1.1 x 4.0 cm).   10/13/2015:  Chest, abdomen, and pelvic CT revealed resolution of the empyema. 02/15/2016:  Chest, abdomen, and pelvic CT revealed multiple soft tissue nodules throughout the pelvis, small bowel mesenteric and peritoneum and, concerning for peritoneal metastatic disease.  There was soft tissue irregularity within the left upper quadrant.  There was mild right hydronephrosis (etiology unclear).  There was interval resolution of previously described right pleural based fluid and gas collection. There was a pleural based nodule within the right lower hemi-thorax concerning for pleural based metastasis.  There were small bilateral pleural effusions.  There was pulmonary nodularity, predominately within the left upper lobe (metastatic or infectious/inflammatory etiology).  There was slightly increased mediastinal adenopathy (infectious/inflammatory or metastatic).  There was a low attenuation lesion within the left hepatic lobe (complicated fluid within the fissure or metastatic disease). 03/08/2016:  Abdomen and pelvic CT revealed mild progression of peritoneal disease in the abdomen.  There was interval increase in loculated fluid around the  lateral segment left liver, stomach, and spleen.  There was persistent soft tissue lesion at the level of the ampulla with mild intra and extrahepatic biliary duct dilatation.  There was persistent intrahepatic and capsular metastatic disease involving the liver. 05/26/2016:  Head MRI revealed multiple small areas of acute infarct involving the occipital parietal lobe bilaterally and the left lateral cerebellum,  consistent with posterior circulation emboli.  06/10/2016:  Adomen and pelvic CT revealed progression of peritoneal carcinomatosis predominantly in the perihepatic space, bilateral lower quadrants and pelvis.  There was worsening bilateral obstructive uropathy due to malignant involvement of the pelvic ureters.  There was stable small pleural/subpleural nodules at the right lung base.  There was stable small left pleural effusion.  There was decreased small volume perihepatic ascites.  08/09/2016:  Abdomen and pelvic CT revealed interval increase and loculated fluid collections within the peritoneal space consistent with progression of peritoneal ovarian carcinoma metastasis.  There was one large 7 cm collection in the RIGHT lower quadrant which had increased significantly in size and may represent of point of small bowel obstruction.  The proximal stomach and duodenum were decompressed. The mid small bowel was mildly dilated. There was concern for obstruction given the large intraperitoneal fluid collection.  There was increase in subcapsular hepatic metastatic fluid collections.  There was enlargement of rounded ampullary lesion in the second portion duodenum concerning for metastatic lesion.  There was mild biliary duct dilatation (stable).  There was nodular pleural metastasis in the RIGHT lower lobe pleural space. 11/03/2016:  Abdomen and pelvic CT revealed multifocal cystic metastases in the abdomen/pelvis, reflecting peritoneal disease.  The dominant lesion in the left anterior abdomen has  progressed, while additional lesions were mixed.  Peritoneal disease along the liver and spleen progressed.  Pleural-based metastases along the posterior right hemithorax progressed. There were trace left pleural effusion.  There was a stable 1.7 cm duodenal lesion at the ampulla. 11/29/2016:  Abdomen and pelvic  CT revealed mild interval enlargement of pleural metastasis the RIGHT lower lobe.  There was overall mild decrease in cystic peritoneal metastasis within the abdomen pelvis. The solid lesion at the terminal ileum was decreased in size. Subcapsular lesion in the liver was slightly decreased in size.  There was no evidence of disease progression the abdomen pelvis.  There was mild intrahepatic duct dilatation similar prior.  The ampullary lesion was slightly larger.  There was mild hydronephrosis and hydroureter on the LEFT likely related to cystic metastasis at the LEFT vesicoureteral junction 12/09/2016:  MRCP revealed the cystic lesion within the pancreatic head/uncinate process had decreased in size and demonstrated no suspicious characteristics. This can be presumed a pseudocyst or focus of side branch duct ectasia.  There was similar to slight progression of extensive peritoneal metastasis since 11/29/2016. Grossly similar abdominal nodal and right pleural metastasis.  There was chronic mild common duct dilatation with similar soft tissue fullness about the ampulla compared to 11/29/2016.  There was improved to resolved left-sided hydronephrosis. 01/20/2017:  Abdomen and pelvic CT revealed complex mixed interval changes in widespread metastatic disease in the peritoneal cavity, lower chest and thoracic, abdominal and pelvic lymph nodes. Right lower chest pleural metastasis and several of the peritoneal tumor implants had increased.  There was new right inguinal lymphadenopathy.  Some of the peritoneal tumor implants had decreased.  The small dependent left pleural effusion was slightly increased .   The mild left hydroureteronephrosis was improved.  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   The chronic biliary ductal dilatation was stable .  There was no evidence of bowel obstruction. 05/22/2017:  Chest, abdomen, and pelvic CT revealed  significant enlargement of metastatic disease involving the right pleura, low paratracheal nodal chain, omentum, peritoneal surfaces, mesentery, and liver. Tumors extending through the ostomy site into the subcutaneous tissues. There were multiple new metastatic liver and right pleural lesions.  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  There was new sclerotic lesion in the T11 vertebral body, concerning for possible metastatic disease. There was healing anterior nondisplaced rib fractures of the right second through ninth ribs that appeared new.   Admissions: [facility] from 06/15/2015 - 06/22/2015.  She underwent exploratory laparotomy, lysis of adhesions, total abdominal hysterectomy with bilateral salpingo-oophorectomy, infracolic omentectomy, optimal tumor debulking(< 1 cm), recto-sigmoid resection with creation of end colostomy, cholecystectomy, mobilization of splenic flexure and liver with diaphragmatic stripping on 06/15/2015. The right diaphragm was cleared of tumor. During dissection, the diaphragm was entered and closed with sutures.  [facility] from 07/01/2015 - 07/03/2015 with pleural effusion. [facility] from 07/07/2015 - 07/09/2015 with recurrent pleural effusion. [facility] from 07/29/2015 - 08/10/2015 with a right-sided empyema and liver abscess. She underwent CT-guided placement of a liver abscess drain on 07/30/2015.  Liver abscess culture grew out group B strep and Enterobacter which was sensitive to Zosyn. She was transitioned to ertapenem Colbert Ewing) prior to discharge.  She was readmitted to [facility] on 09/17/2015. [facility] from 05/26/2016 - 05/28/2016  with altered mental status and an acute embolic CVA.  Head MRI on 05/26/2016 revealed multiple small areas of acute infarct involving the occipital parietal lobe bilaterally and the left lateral cerebellum,  consistent with posterior circulation emboli.  Work-up included a negative carotid ultrasound and echocardiogram. [facility] from 08/10/2016 - 08/14/2016 with a small bowel obstruction.  She was managed conservatively. [place] from 09/27/2016 - 09/28/2016 with symptomatic anemia  and diarrhea.   Orders Only on 07/07/2017  Component Date Value Ref Range Status  . Order Confirmation 07/07/2017    Final                   Value:ORDER PROCESSED BY BLOOD BANK Performed at [facility], [address], [place], [place] [address]   Infusion on 07/07/2017  Component Date Value Ref Range Status  . ABO/RH(D) 07/07/2017 A POS   Final  . Antibody Screen 07/07/2017 NEG   Final  . Sample Expiration 07/07/2017 07/10/2017   Final  . Unit Number 07/07/2017 [id number]   Final  . Blood Component Type 07/07/2017 RBC, LR IRR   Final  . Unit division 07/07/2017 00   Final  . Status of Unit 07/07/2017 ALLOCATED   Final  . Transfusion Status 07/07/2017 OK TO TRANSFUSE   Final  . Crossmatch Result 07/07/2017 Compatible   Final  . Unit tag comment 07/07/2017    Final                   Value:IRRADIATED PRODUCT Performed at [facility], [address], [place], [place] [address]   . Blood Product Unit Number 07/07/2017 [id number]   Final  . Unit Type and Rh 07/07/2017 0600   Final  . Blood Product Expiration Date 07/07/2017 [id number]   Final  Appointment on 07/07/2017  Component Date Value Ref Range Status  . Magnesium 07/07/2017 1.3* 1.7 - 2.4 mg/dL Final   Performed at [facility], [address], [place], [place] [address]  . Sodium 07/07/2017 135  135 - 145 mmol/L Final  . Potassium 07/07/2017 3.6  3.5 - 5.1 mmol/L Final  . Chloride 07/07/2017 99* 101 - 111 mmol/L Final  .  CO2 07/07/2017 31  22 - 32 mmol/L Final  . Glucose, Bld 07/07/2017 127* 65 - 99 mg/dL Final  . BUN 07/07/2017 21* 6 - 20 mg/dL Final  . Creatinine, Ser 07/07/2017 0.72  0.44 - 1.00 mg/dL Final  . Calcium 07/07/2017 9.1  8.9 - 10.3 mg/dL Final  . GFR calc non Af Amer 07/07/2017 >60  >60 mL/min Final  . GFR calc Af Amer 07/07/2017 >60  >60 mL/min Final   Comment: (NOTE) The eGFR has been calculated using the CKD EPI equation. This calculation has not been validated in all clinical situations. eGFR's persistently <60 mL/min signify possible Chronic Kidney Disease.   Georgiann Hahn gap 07/07/2017 5  5 - 15 Final   Performed at [facility], [address] [place], [place] [address]  . WBC 07/07/2017 25.2* 3.6 - 11.0 K/uL Final  . RBC 07/07/2017 2.51* 3.80 - 5.20 MIL/uL Final  . Hemoglobin 07/07/2017 7.6* 12.0 - 16.0 g/dL Final  . HCT 07/07/2017 23.3* 35.0 - 47.0 % Final  . MCV 07/07/2017 92.6  80.0 - 100.0 fL Final  . MCH 07/07/2017 30.3  26.0 - 34.0 pg Final  . MCHC 07/07/2017 32.7  32.0 - 36.0 g/dL Final  . RDW 07/07/2017 19.5* 11.5 - 14.5 % Final  . Platelets 07/07/2017 280  150 - 440 K/uL Final  . Neutrophils Relative % 07/07/2017 96  % Final  . Neutro Abs 07/07/2017 23.9* 1.4 - 6.5 K/uL Final  . Lymphocytes Relative 07/07/2017 3  % Final  . Lymphs Abs 07/07/2017 0.8* 1.0 - 3.6 K/uL Final  . Monocytes Relative 07/07/2017 1  % Final  . Monocytes Absolute 07/07/2017 0.3  0.2 - 0.9 K/uL Final  . Eosinophils  Relative 07/07/2017 0  % Final  . Eosinophils Absolute 07/07/2017 0.0  0 - 0.7 K/uL Final  . Basophils Relative 07/07/2017 0  % Final  . Basophils Absolute 07/07/2017 0.1  0 - 0.1 K/uL Final   Performed at [facility], [address] [place], [place] [address]    Assessment:  ARDITH TEST is a 72 y.o. female with progressive stage IV ovarian cancer.  She presented with abdominal discomfort and bloating.  Omental biopsy on 02/23/2015 revealed metastatic high grade  serous carcinoma, consistent with gynecologic origin.   She was initially diagnosed with clinical stage IIIC (T3cN1Mx).  CA125 was 707 on 02/17/2015.  She received 4 cycles of neoadjuvant carboplatin and Taxol (03/05/2015 - 05/22/2015).  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  She had loose stools on oral magnesium.  She was initially on Neurontin then switched Lyrica with cycle #3.  Cycle #4 was notable for neutropenia (ANC 300) requiring GCSF x 3 days.    She underwent exploratory laparotomy, lysis of adhesions, total abdominal hysterectomy with bilateral salpingo-oophorectomy, infracolic omentectomy, optimal tumor debulking(< 1 cm), recto-sigmoid resection with creation of end colostomy, cholecystectomy, mobilization of splenic flexure and liver with diaphragmatic stripping on 06/15/2015. The right diaphragm was cleared of tumor. During dissection, the diaphragm was entered and closed with sutures.  She received tamoxifen from 02/25/2016 - 03/14/2016.  She received 4 cycles of carboplatin and gemcitabine (03/21/2016 - 05/24/2016) with GCSF/Neulasta support.  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  Cymbalta began on 02/24/2017  PDL-1 testing revealed a combined score was 5 (>=1 positive).     [employer] genetic testing negative for APC, ATM, BMPR1A, BRCA1, BRCA2, BRIP1, CDH1, CDK4, CDKN2A, CHEK2, EPCAM, MLH1, MSH2, MSH6, MUTYH, NBN, PALB2, PMS2, PTEN, RAD51C, RAD51D, SMAD4, STK11, TP53. Sequencing for select regions of POLE and POLD1, and large rearrangement analysis for select regions of GREM1 were negative.  She received 2 cycles of Doxil (06/21/2016 - 07/19/2016) with Neulasta support.  She received 4 cycles of topotecan with Neulasta support (08/22/2016 - 10/10/2016).    She received 3 cycles of pembrolizumab (Keytruda) from 12/06/2016 -  01/09/2017.  Cycle #1 was complicated by neutropenia (ANC 400) requiring GCSF/Granix x 3 days.  She received GCSF x 5 days with  cycle #2.  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 Right lower chest pleural metastasis and several of the peritoneal tumor implants had increased.  There was new right inguinal lymphadenopathy.  Some of the peritoneal tumor implants had decreased.  The small dependent left pleural effusion was slightly increased .  The mild left hydroureteronephrosis was improved.  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   The chronic biliary ductal dilatation was stable .  There was no evidence of bowel obstruction.  CA125 was 802.9 on 03/30/2015, 567.9 on 04/13/2015, 168.8 on 05/15/2015, 85.2 on 07/17/2015, 68.6 on 07/28/2015, 34.5 on 09/17/2015, 20.7 on 11/06/2015, 49 on 01/22/2016, 106.1 on 02/22/2016, 138.8 on 03/07/2016, 220.8 on 03/21/2016, 152.8 on 04/11/2016, 125.6 on 04/18/2016, 76.8 on 05/03/2016, 60.2 on 05/24/2016, 44 on 07/19/2016, 65.8 on 08/17/2016, 53.4 on 09/12/2016, 47.3 on 10/10/2016, 91 on 12/20/2016, 141.1 on 01/09/2017, 463.7 on 02/27/2017, 907.1 on 03/27/2017, 192.1 on 05/22/2017, 189.1 on 05/24/2017, and 144.3 on 06/19/2017.  She has a history of recurrent right sided pleural effusion.  She underwent thoracentesis of 650 cc on  post-operative day 3.  She was admitted to [facility] on 07/01/2015 and 07/07/2015 for recurrent shortness of breath.  She underwent 2 additional thoracenteses (1.1 L on 07/02/2015 and 850 cc on 07/08/2015).  Cytology was negative x 2.  Bilateral lower extremity duplex on 07/03/2015 was negative.  Echo revealed an EF of 55-60% on 07/08/2015 and 60-65% on 05/27/2016.    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 She was on Lovenox 60 mg twice a day.  She switched to Eliquis on  04/16/2016 then returned back to Lovenox after her CVA.  She has severe rheumatoid arthritis.  Methotrexate and Enbrel were initially on hold.  She has a normocytic anemia.  Work-up on 02/17/2015 and 05/24/2016 revealed a normal ferritin, B12, folate, TSH.  She denies any melena or hematochezia.    She has anemia due to chronic disease. She received 1 unit PRBCs during her admission at [facility]. She denies any melena or hematochezia. She has diabetes and is on an insulin pump.  She has had persistent neutropenia felt secondary to her rheumatoid arthritis.  Folate and MMA were normal.  TSH was 6.13 (high) with a free T4 of 1.17 (0.61-1.12).  She began methotrexate (10 mg a week) and prednisone (5 mg a day) for severe rheumatoid arthritis on 12/17/2015.  She remains on prednisone alone (5 mg a day).  Bone marrow aspirate and biopsy on 06/09/2010 revealed a hypercellular marrow (70%) with no evidence of dysplasia or malignancy.  Flow cytometry was negative.  Cytogenetics were normal (46,XX).  FISH studies were negative for MDS.  Bone marrow aspirate and biopsy on 12/03/2015 revealed a normocellular to mildy hypercellular marrow for age (40%) with left shifted myelopoiesis, non specific dyserythropoiesis and mild megakaryocytic atypia with no increase in blasts.  There were multiple small nonspecific lymphoid aggregates (favor reactive). There was no increase in reticulin.  There was decreased myeloid cells (37%) with left shifted maturation and 1% atypical myelod blasts.  There was relatively increased monocytic cells (11%), relatively increased lymphoid cells (36%), and relatively increased eosinophils (6%).  Cytogenetics were normal (62, XX).  SNP microarray was normal.  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  She receives IV magnesium twice weekly.    She has chemotherapy induced anemia.  She has received Procrit (last 06/26/2017).  She received 1 unit of PRBCs on 06/05/2017.  Labs on  08/22/2016 revealed a normal B12 and folate.  Ferritin was 201, iron saturation 21% and TIBC 307 on 06/09/2017.  B12 and folate were normal.  Code status is DNR/DNI.  She developed acute LFT elevation on 11/28/2016.  Event was preceded by upper abdominal discomfort.  Hepatitis B core antibody total and hepatitis C antibody were negative.  She is on day 12 of cycle #5 Abraxane (03/13/2017 - 06/26/2017).  Cycle #1 was truncated after day 1 secondary to side effects and Thanksgiving.  Chest, abdomen, and pelvic CT on 05/22/2017 revealed  significant enlargement of metastatic disease involving the right pleura, low paratracheal nodal chain, omentum, peritoneal surfaces, mesentery, and liver. Tumors extending through the ostomy site into the subcutaneous tissues. There were multiple new metastatic liver and right pleural lesions.  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  There was new sclerotic lesion in the T11 vertebral body, concerning  for possible metastatic disease. There was healing anterior nondisplaced rib fractures of the right second through ninth ribs that appeared new.  Symptomatically, she notes new abdominal symptoms.  Exam reveals nodularity/fullness under inferior rib border and nodularity around stoma.  Hematocrit is 23.6 with a hemoglobin of 7.6.  Magnesium is 1.3.  Plan: 1.  Discuss counts today.  Discuss plans for transfusion on 07/10/2017. 2.  Discuss plans to continue Procrit every 2 weeks. 3.  Discuss concern for symptoms possibly indicating progression.  Discuss cancelling day 15 Abraxane on 07/10/2017 and scheduling restaging CT scans earlier (07/11/2017). If scans are stable to improved, will begin cycle #6 on 07/17/2017. 4.  Discuss Hospice if scans reveal progressive disease as no other chemotherapy options available.  Code status is DNR/DNI.  Patient is  not a surgical candidate. 5.  Continue Mg check twice weekly (Monday and Friday) and supplement as needed. 6.  Cancel Neulasta next week.  If counts drop, will give GCSF as needed.   Honor Loh, NP  07/07/2017, 3:14 PM   I saw and evaluated the patient, participating in the key portions of the service and reviewing pertinent diagnostic studies and records.  I reviewed the nurse practitioner's note and agree with the findings and the plan.  The assessment and plan were discussed with the patient.  Several questions were asked by the patient and answered.   Nolon Stalls, MD 07/07/2017,3:14 PM

## 2017-07-07 NOTE — Progress Notes (Signed)
Patient states she has been having upper mid abdominal pain, more after eating or when lying down.  States this has been happening over the past several weeks.  States she feels like food does not go down well.  States she has to "belch to get it to go down".  Usually drinks Ensure for breakfast and lunch.

## 2017-07-10 ENCOUNTER — Inpatient Hospital Stay: Payer: Medicare Other

## 2017-07-10 ENCOUNTER — Telehealth: Payer: Self-pay | Admitting: *Deleted

## 2017-07-10 ENCOUNTER — Inpatient Hospital Stay: Payer: Medicare Other | Admitting: Hematology and Oncology

## 2017-07-10 ENCOUNTER — Other Ambulatory Visit: Payer: Self-pay | Admitting: Urgent Care

## 2017-07-10 ENCOUNTER — Ambulatory Visit
Admission: RE | Admit: 2017-07-10 | Discharge: 2017-07-10 | Disposition: A | Discharge status: Home / Self Care | Payer: Medicare Other | Source: Ambulatory Visit | Attending: Urgent Care | Admitting: Urgent Care

## 2017-07-10 VITALS — BP 170/70 | HR 81 | Temp 97.1°F | Resp 18

## 2017-07-10 DIAGNOSIS — D6481 Anemia due to antineoplastic chemotherapy: Secondary | ICD-10-CM

## 2017-07-10 DIAGNOSIS — C800 Disseminated malignant neoplasm, unspecified: Secondary | ICD-10-CM | POA: Insufficient documentation

## 2017-07-10 DIAGNOSIS — N131 Hydronephrosis with ureteral stricture, not elsewhere classified: Secondary | ICD-10-CM | POA: Insufficient documentation

## 2017-07-10 DIAGNOSIS — R1900 Intra-abdominal and pelvic swelling, mass and lump, unspecified site: Secondary | ICD-10-CM | POA: Diagnosis not present

## 2017-07-10 DIAGNOSIS — Z933 Colostomy status: Secondary | ICD-10-CM | POA: Diagnosis not present

## 2017-07-10 DIAGNOSIS — R590 Localized enlarged lymph nodes: Secondary | ICD-10-CM | POA: Diagnosis not present

## 2017-07-10 DIAGNOSIS — K7689 Other specified diseases of liver: Secondary | ICD-10-CM | POA: Diagnosis not present

## 2017-07-10 DIAGNOSIS — J90 Pleural effusion, not elsewhere classified: Secondary | ICD-10-CM | POA: Insufficient documentation

## 2017-07-10 DIAGNOSIS — C561 Malignant neoplasm of right ovary: Secondary | ICD-10-CM | POA: Diagnosis present

## 2017-07-10 DIAGNOSIS — C801 Malignant (primary) neoplasm, unspecified: Secondary | ICD-10-CM | POA: Insufficient documentation

## 2017-07-10 DIAGNOSIS — N134 Hydroureter: Secondary | ICD-10-CM | POA: Insufficient documentation

## 2017-07-10 DIAGNOSIS — T451X5A Adverse effect of antineoplastic and immunosuppressive drugs, initial encounter: Secondary | ICD-10-CM

## 2017-07-10 DIAGNOSIS — R918 Other nonspecific abnormal finding of lung field: Secondary | ICD-10-CM | POA: Diagnosis not present

## 2017-07-10 DIAGNOSIS — C786 Secondary malignant neoplasm of retroperitoneum and peritoneum: Secondary | ICD-10-CM | POA: Diagnosis not present

## 2017-07-10 LAB — CBC WITH DIFFERENTIAL/PLATELET
Basophils Absolute: 0 10*3/uL (ref 0–0.1)
Basophils Relative: 1 %
Eosinophils Absolute: 0 10*3/uL (ref 0–0.7)
Eosinophils Relative: 1 %
HCT: 23.8 % — ABNORMAL LOW (ref 35.0–47.0)
Hemoglobin: 7.9 g/dL — ABNORMAL LOW (ref 12.0–16.0)
Lymphocytes Relative: 13 %
Lymphs Abs: 0.7 10*3/uL — ABNORMAL LOW (ref 1.0–3.6)
MCH: 30.6 pg (ref 26.0–34.0)
MCHC: 33.2 g/dL (ref 32.0–36.0)
MCV: 92.4 fL (ref 80.0–100.0)
Monocytes Absolute: 0.6 10*3/uL (ref 0.2–0.9)
Monocytes Relative: 10 %
Neutro Abs: 4.2 10*3/uL (ref 1.4–6.5)
Neutrophils Relative %: 75 %
Platelets: 298 10*3/uL (ref 150–440)
RBC: 2.57 MIL/uL — ABNORMAL LOW (ref 3.80–5.20)
RDW: 19.6 % — ABNORMAL HIGH (ref 11.5–14.5)
WBC: 5.6 10*3/uL (ref 3.6–11.0)

## 2017-07-10 LAB — COMPREHENSIVE METABOLIC PANEL
ALT: 9 U/L — ABNORMAL LOW (ref 14–54)
AST: 15 U/L (ref 15–41)
Albumin: 3.3 g/dL — ABNORMAL LOW (ref 3.5–5.0)
Alkaline Phosphatase: 125 U/L (ref 38–126)
Anion gap: 6 (ref 5–15)
BUN: 14 mg/dL (ref 6–20)
CO2: 29 mmol/L (ref 22–32)
Calcium: 8.9 mg/dL (ref 8.9–10.3)
Chloride: 102 mmol/L (ref 101–111)
Creatinine, Ser: 0.73 mg/dL (ref 0.44–1.00)
GFR calc Af Amer: >60 mL/min (ref >60)
GFR calc non Af Amer: >60 mL/min (ref >60)
Glucose, Bld: 189 mg/dL — ABNORMAL HIGH (ref 65–99)
Potassium: 3.9 mmol/L (ref 3.5–5.1)
Sodium: 137 mmol/L (ref 135–145)
Total Bilirubin: 0.7 mg/dL (ref 0.3–1.2)
Total Protein: 6.6 g/dL (ref 6.5–8.1)

## 2017-07-10 LAB — MAGNESIUM: Magnesium: 1.5 mg/dL — ABNORMAL LOW (ref 1.7–2.4)

## 2017-07-10 MED ORDER — SODIUM CHLORIDE 0.9% FLUSH
10.0000 mL | INTRAVENOUS | Status: DC | PRN
  Administered 2017-07-10: 10 mL via INTRAVENOUS
  Filled 2017-07-10: qty 10

## 2017-07-10 MED ORDER — ACETAMINOPHEN 325 MG PO TABS
ORAL_TABLET | ORAL | Status: AC
  Filled 2017-07-10: qty 2

## 2017-07-10 MED ORDER — SODIUM CHLORIDE 0.9 % IV SOLN: 6.0000 g | Freq: Once | INTRAVENOUS | Status: DC

## 2017-07-10 MED ORDER — IOPAMIDOL (ISOVUE-300) INJECTION 61%
100.0000 mL | Freq: Once | INTRAVENOUS | Status: AC | PRN
  Administered 2017-07-10: 100 mL via INTRAVENOUS

## 2017-07-10 MED ORDER — EPOETIN ALFA 10000 UNIT/ML IJ SOLN
10000.0000 [IU] | Freq: Once | INTRAMUSCULAR | Status: AC
  Administered 2017-07-10: 10000 [IU] via SUBCUTANEOUS

## 2017-07-10 MED ORDER — DIPHENHYDRAMINE HCL 25 MG PO CAPS
25.0000 mg | ORAL_CAPSULE | Freq: Once | ORAL | Status: AC
  Administered 2017-07-10: 25 mg via ORAL

## 2017-07-10 MED ORDER — MAGNESIUM SULFATE 4 GM/100ML IV SOLN
4.0000 g | Freq: Once | INTRAVENOUS | Status: AC
Start: 2017-07-10 — End: 2017-07-10
  Administered 2017-07-10: 4 g via INTRAVENOUS

## 2017-07-10 MED ORDER — HEPARIN SOD (PORK) LOCK FLUSH 100 UNIT/ML IV SOLN
500.0000 [IU] | Freq: Once | INTRAVENOUS | Status: AC
  Administered 2017-07-10: 500 [IU] via INTRAVENOUS

## 2017-07-10 MED ORDER — HEPARIN SOD (PORK) LOCK FLUSH 100 UNIT/ML IV SOLN
INTRAVENOUS | Status: AC
  Filled 2017-07-10: qty 5

## 2017-07-10 MED ORDER — SODIUM CHLORIDE 0.9 % IV SOLN
Freq: Once | INTRAVENOUS | Status: AC
  Administered 2017-07-10: 10:00:00 via INTRAVENOUS
  Filled 2017-07-10: qty 1000

## 2017-07-10 MED ORDER — MAGNESIUM SULFATE 2 GM/50ML IV SOLN
2.0000 g | Freq: Once | INTRAVENOUS | Status: AC
Start: 2017-07-10 — End: 2017-07-10
  Administered 2017-07-10: 2 g via INTRAVENOUS

## 2017-07-10 MED ORDER — ACETAMINOPHEN 325 MG PO TABS
650.0000 mg | ORAL_TABLET | Freq: Once | ORAL | Status: AC
  Administered 2017-07-10: 650 mg via ORAL

## 2017-07-10 MED ORDER — DIPHENHYDRAMINE HCL 25 MG PO CAPS
ORAL_CAPSULE | ORAL | Status: AC
  Filled 2017-07-10: qty 1

## 2017-07-10 NOTE — Telephone Encounter (Signed)
Per Rodena Piety 07/10/17 staff message schedule patient @ 9:15 for MD/CT results on 07/11/17 Message left of patients vmail

## 2017-07-10 NOTE — Patient Instructions (Addendum)
Blood Transfusion, Care After This sheet gives you information about how to care for yourself after your procedure. Your doctor may also give you more specific instructions. If you have problems or questions, contact your doctor. Follow these instructions at home:  Take over-the-counter and prescription medicines only as told by your doctor.  Go back to your normal activities as told by your doctor.  Follow instructions from your doctor about how to take care of the area where an IV tube was put into your vein (insertion site). Make sure you: ? Wash your hands with soap and water before you change your bandage (dressing). If there is no soap and water, use hand sanitizer. ? Change your bandage as told by your doctor.  Check your IV insertion site every day for signs of infection. Check for: ? More redness, swelling, or pain. ? More fluid or blood. ? Warmth. ? Pus or a bad smell. Contact a doctor if:  You have more redness, swelling, or pain around the IV insertion site..  You have more fluid or blood coming from the IV insertion site.  Your IV insertion site feels warm to the touch.  You have pus or a bad smell coming from the IV insertion site.  Your pee (urine) turns pink, red, or brown.  You feel weak after doing your normal activities. Get help right away if:  You have signs of a serious allergic or body defense (immune) system reaction, including: ? Itchiness. ? Hives. ? Trouble breathing. ? Anxiety. ? Pain in your chest or lower back. ? Fever, flushing, and chills. ? Fast pulse. ? Rash. ? Watery poop (diarrhea). ? Throwing up (vomiting). ? Dark pee. ? Serious headache. ? Dizziness. ? Stiff neck. ? Yellow color in your face or the white parts of your eyes (jaundice). Summary  After a blood transfusion, return to your normal activities as told by your doctor.  Every day, check for signs of infection where the IV tube was put into your vein.  Some signs of  infection are warm skin, more redness and pain, more fluid or blood, and pus or a bad smell where the needle went in.  Contact your doctor if you feel weak or have any unusual symptoms. This information is not intended to replace advice given to you by your health care provider. Make sure you discuss any questions you have with your health care provider. Document Released: 05/09/2014 Document Revised: 12/11/2015 Document Reviewed: 12/11/2015 Elsevier Interactive Patient Education  2017 [employer] Magnesium Sulfate injection What is this medicine? MAGNESIUM SULFATE (mag NEE zee um SUL fate) is an electrolyte injection commonly used to treat low magnesium levels in your blood. It is also used to prevent or control seizures in women with preeclampsia or eclampsia. This medicine may be used for other purposes; ask your health care provider or pharmacist if you have questions. What should I tell my health care provider before I take this medicine? They need to know if you have any of these conditions: -heart disease -history of irregular heart beat -kidney disease -an unusual or allergic reaction to magnesium sulfate, medicines, foods, dyes, or preservatives -pregnant or trying to get pregnant -breast-feeding How should I use this medicine? This medicine is for infusion into a vein. It is given by a health care professional in a hospital or clinic setting. Talk to your pediatrician regarding the use of this medicine in children. While this drug may be prescribed for selected conditions, precautions do apply. Overdosage:  If you think you have taken too much of this medicine contact a poison control center or emergency room at once. NOTE: This medicine is only for you. Do not share this medicine with others. What if I miss a dose? This does not apply. What may interact with this medicine? This medicine may interact with the following medications: -certain medicines for anxiety or  sleep -certain medicines for seizures like phenobarbital -digoxin -medicines that relax muscles for surgery -narcotic medicines for pain This list may not describe all possible interactions. Give your health care provider a list of all the medicines, herbs, non-prescription drugs, or dietary supplements you use. Also tell them if you smoke, drink alcohol, or use illegal drugs. Some items may interact with your medicine. What should I watch for while using this medicine? Your condition will be monitored carefully while you are receiving this medicine. You may need blood work done while you are receiving this medicine. What side effects may I notice from receiving this medicine? Side effects that you should report to your doctor or health care professional as soon as possible: -allergic reactions like skin rash, itching or hives, swelling of the face, lips, or tongue -facial flushing -muscle weakness -signs and symptoms of low blood pressure like dizziness; feeling faint or lightheaded, falls; unusually weak or tired -signs and symptoms of a dangerous change in heartbeat or heart rhythm like chest pain; dizziness; fast or irregular heartbeat; palpitations; breathing problems -sweating This list may not describe all possible side effects. Call your doctor for medical advice about side effects. You may report side effects to FDA at 1-800-FDA-1088. Where should I keep my medicine? This drug is given in a hospital or clinic and will not be stored at home. NOTE: This sheet is a summary. It may not cover all possible information. If you have questions about this medicine, talk to your doctor, pharmacist, or health care provider.  2018 Elsevier/Gold Standard (2015-11-04 12:31:42)

## 2017-07-10 NOTE — Telephone Encounter (Signed)
  Please schedule follow-up tomorrow.  M

## 2017-07-10 NOTE — Telephone Encounter (Signed)
Called report:  IMPRESSION: Chest Impression:  1. RIGHT lower lobe pleural-based nodules measure slightly smaller. 2. Stable bilateral pleural effusions LEFT greater than RIGHT. 3. Stable mediastinal adenopathy.  Abdomen / Pelvis Impression:  1. Evidence of progression of disease in the abdomen pelvis. 2. Interval increase in size of hepatic lesions most noticeably in the LEFT hepatic lobe. 3. Interval increase in bilateral hydronephrosis and hydroureter. 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 4. Interval increase in size of large peritoneal and pelvic peritoneal implants. 5. 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  These results will be called to the ordering clinician or representative by the Radiologist Assistant, and communication documented in the PACS or zVision Dashboard.   Electronically Signed   By: Suzy Bouchard M.D.   On: 07/10/2017 15:21

## 2017-07-10 NOTE — Progress Notes (Signed)
Patient received 6 grams of Magnesium and 1 unit of blood and procrit injection as ordered.  Patient reported emesis after CT scan.  No further issues today.

## 2017-07-10 NOTE — Progress Notes (Signed)
Patient reports that this is the first morning that she hasn't had any abdominal pain and "feels pretty good".

## 2017-07-10 NOTE — Telephone Encounter (Signed)
Patient has been worked in on the clinical schedule tomorrow, 07/11/2017, at 0915 to discuss her CT results and plans for treatment going forward.

## 2017-07-11 ENCOUNTER — Ambulatory Visit: Admission: RE | Admit: 2017-07-11 | Payer: Medicare Other | Source: Ambulatory Visit

## 2017-07-11 ENCOUNTER — Telehealth: Payer: Self-pay | Admitting: Urology

## 2017-07-11 ENCOUNTER — Inpatient Hospital Stay: Payer: Medicare Other | Admitting: Hematology and Oncology

## 2017-07-11 ENCOUNTER — Other Ambulatory Visit: Payer: Self-pay | Admitting: Hematology and Oncology

## 2017-07-11 ENCOUNTER — Encounter: Payer: Self-pay | Admitting: Hematology and Oncology

## 2017-07-11 VITALS — BP 149/70 | HR 90 | Temp 97.8°F | Resp 20 | Wt 131.6 lb

## 2017-07-11 DIAGNOSIS — Z86718 Personal history of other venous thrombosis and embolism: Secondary | ICD-10-CM

## 2017-07-11 DIAGNOSIS — K219 Gastro-esophageal reflux disease without esophagitis: Secondary | ICD-10-CM

## 2017-07-11 DIAGNOSIS — Z9641 Presence of insulin pump (external) (internal): Secondary | ICD-10-CM

## 2017-07-11 DIAGNOSIS — D6481 Anemia due to antineoplastic chemotherapy: Secondary | ICD-10-CM | POA: Diagnosis not present

## 2017-07-11 DIAGNOSIS — T451X5A Adverse effect of antineoplastic and immunosuppressive drugs, initial encounter: Secondary | ICD-10-CM

## 2017-07-11 DIAGNOSIS — C561 Malignant neoplasm of right ovary: Secondary | ICD-10-CM | POA: Diagnosis not present

## 2017-07-11 DIAGNOSIS — Z90722 Acquired absence of ovaries, bilateral: Secondary | ICD-10-CM

## 2017-07-11 DIAGNOSIS — Z90710 Acquired absence of both cervix and uterus: Secondary | ICD-10-CM

## 2017-07-11 DIAGNOSIS — I509 Heart failure, unspecified: Secondary | ICD-10-CM

## 2017-07-11 DIAGNOSIS — R6881 Early satiety: Secondary | ICD-10-CM

## 2017-07-11 DIAGNOSIS — Z794 Long term (current) use of insulin: Secondary | ICD-10-CM

## 2017-07-11 DIAGNOSIS — Z5111 Encounter for antineoplastic chemotherapy: Secondary | ICD-10-CM | POA: Diagnosis not present

## 2017-07-11 DIAGNOSIS — M069 Rheumatoid arthritis, unspecified: Secondary | ICD-10-CM

## 2017-07-11 DIAGNOSIS — M7989 Other specified soft tissue disorders: Secondary | ICD-10-CM | POA: Diagnosis not present

## 2017-07-11 DIAGNOSIS — R231 Pallor: Secondary | ICD-10-CM | POA: Diagnosis not present

## 2017-07-11 DIAGNOSIS — R0602 Shortness of breath: Secondary | ICD-10-CM

## 2017-07-11 DIAGNOSIS — T451X5S Adverse effect of antineoplastic and immunosuppressive drugs, sequela: Secondary | ICD-10-CM

## 2017-07-11 DIAGNOSIS — J90 Pleural effusion, not elsewhere classified: Secondary | ICD-10-CM

## 2017-07-11 DIAGNOSIS — Z7189 Other specified counseling: Secondary | ICD-10-CM

## 2017-07-11 DIAGNOSIS — C786 Secondary malignant neoplasm of retroperitoneum and peritoneum: Secondary | ICD-10-CM | POA: Diagnosis not present

## 2017-07-11 DIAGNOSIS — I10 Essential (primary) hypertension: Secondary | ICD-10-CM

## 2017-07-11 DIAGNOSIS — R1012 Left upper quadrant pain: Secondary | ICD-10-CM

## 2017-07-11 DIAGNOSIS — R110 Nausea: Secondary | ICD-10-CM | POA: Diagnosis not present

## 2017-07-11 DIAGNOSIS — Z8673 Personal history of transient ischemic attack (TIA), and cerebral infarction without residual deficits: Secondary | ICD-10-CM

## 2017-07-11 DIAGNOSIS — I252 Old myocardial infarction: Secondary | ICD-10-CM

## 2017-07-11 DIAGNOSIS — C801 Malignant (primary) neoplasm, unspecified: Secondary | ICD-10-CM

## 2017-07-11 DIAGNOSIS — I2510 Atherosclerotic heart disease of native coronary artery without angina pectoris: Secondary | ICD-10-CM

## 2017-07-11 DIAGNOSIS — E109 Type 1 diabetes mellitus without complications: Secondary | ICD-10-CM

## 2017-07-11 DIAGNOSIS — K222 Esophageal obstruction: Secondary | ICD-10-CM

## 2017-07-11 DIAGNOSIS — C800 Disseminated malignant neoplasm, unspecified: Principal | ICD-10-CM

## 2017-07-11 DIAGNOSIS — N1330 Unspecified hydronephrosis: Secondary | ICD-10-CM | POA: Diagnosis not present

## 2017-07-11 DIAGNOSIS — E785 Hyperlipidemia, unspecified: Secondary | ICD-10-CM

## 2017-07-11 DIAGNOSIS — Z87891 Personal history of nicotine dependence: Secondary | ICD-10-CM

## 2017-07-11 DIAGNOSIS — Z9221 Personal history of antineoplastic chemotherapy: Secondary | ICD-10-CM

## 2017-07-11 LAB — TYPE AND SCREEN
ABO/RH(D): A POS
Antibody Screen: NEGATIVE
Unit division: 0

## 2017-07-11 LAB — BPAM RBC
Blood Product Expiration Date: 201903182359
ISSUE DATE / TIME: 201903111421
Unit Type and Rh: 600

## 2017-07-11 NOTE — Telephone Encounter (Signed)
She will be here on 07-12-17 @ 11:00  michelle

## 2017-07-11 NOTE — Progress Notes (Signed)
Patient here today for CT results. Patient states she threw up the contrast yesterday while there for CT.  Continues to have discomfort in upper abdomen.  Accompanied by husband and daughter today.

## 2017-07-11 NOTE — Progress Notes (Signed)
[facility] day:  07/11/2017   Chief Complaint: [name] is a 72 y.o. female with stage IV ovarian cancer who is seen for review of interval CT scans and discussion regarding direction of therapy.  HPI:  The patient was last seen in the medical oncology clinic on 07/07/2017 for sick call visit.  She noted new abdominal symptoms.  Exam revealed nodularity/fullness under inferior rib border and nodularity around stoma.  Hematocrit was 23.6 with a hemoglobin of 7.6.  Magnesium was 1.3.  Decision was made to defer further Abraxane and schedule CT scans.  She received 1 unit of PRBCs yesterday for symptomatic anemia.  She received Procrit.  She received 6 gm of magnesium.  Chest, abdomen and pelvic CT on 07/10/2017 revealed RIGHT lower lobe pleural-based nodules, slightly smaller.  There was stable bilateral pleural effusions (left > right) and stable mediastinal adenopathy.  There was evidence of progression of disease in the abdomen pelvis.  There was interval increase in size of hepatic lesions most noticeably in the LEFT hepatic lobe.  There was interval increase in bilateral hydronephrosis and hydroureter.  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  There was interval increase in size of large peritoneal and pelvic peritoneal implants.  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  Symptomatically, she denies any new complaints.  She continues to have abdominal discomfort.  She denies any change in urine.   Past Medical History:  Diagnosis Date  . CAD (coronary artery disease)    a. 09/2012 Cath: LM nl, LAD 95p, 8m LCX 936mOM2 50, RCA 100.  . Marland KitchenHF (congestive heart failure) ([facility]  . Cholelithiasis   . Collagen vascular disease (HCC)    Rheumatoid Arthritis.  . Esophageal stricture   . Exertional shortness of breath   . GERD (gastroesophageal  reflux disease)   . H/O hiatal hernia   . Herniated disc   . History of pancytopenia   . Hyperlipidemia   . Hypertension   . Hypokalemia   . Leukopenia 2012   s/p bone marrow biopsy, Dr. PaMa Hillock. NSTEMI (non-ST elevated myocardial infarction) ([place]/2014   "mild" (10/18/2012)  . Ovarian cancer ([facility]   chemo and hysterectomy  . Pneumonia 2013; 08/2012   "one lung; double" (10/18/2012)  . Rheumatoid arthritis(714.0)   . Type I diabetes mellitus ([place]   "dx'd in 1957" (10/18/2012)    Past Surgical History:  Procedure Laterality Date  . ABDOMINAL HYSTERECTOMY  06/15/2015   Dx L/S, EXLAP TAH BSO omentectomy RSRx colostomy diaphragm resection stripping  . CARDIAC CATHETERIZATION  10/18/2012   "first one was today" (10/18/2012)  . CATARACT EXTRACTION W/ INTRAOCULAR LENS IMPLANT Right 2010  . CHOLECYSTECTOMY  06/15/2015   combined case with ovarian cancer debulking  . COLON SURGERY    . CORONARY ARTERY BYPASS GRAFT N/A 10/19/2012   Procedure: CORONARY ARTERY BYPASS GRAFTING (CABG);  Surgeon: StMelrose NakayamaMD;  [facility] Service: Open Heart Surgery;  Laterality: N/A;  . ESOPHAGEAL DILATION     "3 or 4 times" (10/19/2011)  . ESOPHAGOGASTRODUODENOSCOPY  2012   Dr. IfMinna Merritts. OSTOMY    . OVARY SURGERY     removal  . PERIPHERAL VASCULAR CATHETERIZATION N/A 03/02/2015   Procedure: PoGlori Luisath Insertion;  Surgeon: JaAlgernon HuxleyMD;  Location: [facility];  Service: Cardiovascular;  Laterality: N/A;  .  TUBAL LIGATION  1970    Family History  Problem Relation Age of Onset  . Diabetes Mother   . Arthritis Mother   . Diabetes Father   . Arthritis Father   . Aneurysm Sister        neck  . Cancer Sister        lung    Social History:  reports that she quit smoking about 23 years ago. Her smoking use included cigarettes. She has a 30.00 pack-year smoking history. she has never used smokeless tobacco. She reports that she does not drink alcohol or use drugs.   Patient is a retired Art therapist. Patient denies exposure to radiation and toxins. She spends every Thursday afternoon with her sister.  Her son is [name] and daughter is [name].  She is accompanied by her husband and daughter today.  Allergies:  Allergies  Allergen Reactions  . Codeine Nausea And Vomiting  . Latex Rash    Current Medications: Current Outpatient Medications  Medication Sig Dispense Refill  . Calcium-Vitamin D 600-200 MG-UNIT tablet Take 1 tablet by mouth 2 (two) times daily.     . cholecalciferol (VITAMIN D) 1000 units tablet Take 1,000 Units by mouth daily.    . DULoxetine (CYMBALTA) 20 MG capsule Take 1 capsule (20 mg total) by mouth daily. 90 capsule 2  . enoxaparin (LOVENOX) 60 MG/0.6ML injection INJECT 0.6ML (60MG TOTAL) UNDER THE SKIN EVERY 12 HOURS 36 mL 1  . folic acid (FOLVITE) 1 MG tablet Take 1 tablet (1 mg total) by mouth daily. 90 tablet 3  . furosemide (LASIX) 40 MG tablet Take 1 tablet (40 mg total) by mouth daily as needed for edema. 30 tablet 1  . insulin lispro (HUMALOG) 100 UNIT/ML injection Inject 0.06 mLs (6 Units total) into the skin daily. (Patient taking differently: Inject 0-85 Units into the skin daily. Via insulin pump) 30 mL 3  . lidocaine-prilocaine (EMLA) cream APPLY 1 APPLICATION TOPICALLY AS NEEDED 1 HOUR PRIOR TO TREATMENT. COVER WITH PRESS N SEAL UNTIL TREATMENT TIME AS DIRECTED 30 g 1  . loratadine (CLARITIN) 10 MG tablet Take 1 tablet by mouth daily as needed.    . magnesium oxide (MAG-OX) 400 (241.3 Mg) MG tablet TAKE 1 TABLET BY MOUTH ONCE DAILY 30 tablet 1  . metoprolol tartrate (LOPRESSOR) 25 MG tablet Take 0.5 tablets (12.5 mg total) by mouth daily after breakfast. 45 tablet 3  . ondansetron (ZOFRAN) 8 MG tablet TAKE 1 TABLET BY MOUTH EVERY 8 HOURS AS NEEDED FOR NAUSEA AND VOMITING 30 tablet 1  . pantoprazole (PROTONIX) 40 MG tablet TAKE 1 TABLET BY MOUTH TWICE (2) DAILY 180 tablet 1  . potassium chloride SA (K-DUR,KLOR-CON) 20 MEQ  tablet TAKE 1 TABLET BY MOUTH ONCE A DAY 90 tablet 1  . predniSONE (DELTASONE) 5 MG tablet Take 5 mg by mouth daily with breakfast.    . vitamin C (ASCORBIC ACID) 500 MG tablet Take 500 mg by mouth daily.    Marland Kitchen zinc gluconate 50 MG tablet Take 50 mg daily by mouth.     No current facility-administered medications for this visit.    Facility-Administered Medications Ordered in Other Visits  Medication Dose Route Frequency Provider Last Rate Last Dose  . 0.9 %  sodium chloride infusion   Intravenous Continuous Lequita Asal, MD   Stopped at 04/24/17 1140  . magnesium sulfate IVPB 4 g 100 mL  4 g Intravenous Once Jacquelin Hawking, NP      .  sodium chloride 0.9 % injection 10 mL  10 mL Intracatheter PRN Corcoran, Melissa C, MD      . sodium chloride 0.9 % injection 10 mL  10 mL Intravenous PRN Lequita Asal, MD   10 mL at 04/13/15 1700    Review of Systems:  GENERAL:  Feels "the same".  Fatigue.  No fever, chills or sweats.  Weight up 1 pound. PERFORMANCE STATUS (ECOG):  1-2. HEENT:  No visual changes, sore throat, mouth sores or tenderness. Lungs:  Shortness of breath on exertion.  No cough.  No hemoptysis. Cardiac:  No chest pain, palpitations, orthopnea, or PND. GI:  Burning sensation in abdomen.  Nausea in AM, well managed.  No vomiting, diarrhea, melena or hematochezia.  Stoma change. GU:  No urgency, frequency, dysuria, or hematuria. Musculoskeletal:  Back pain.  Severe rheumatoid arthritis (on prednisone).  Osteoporosis.  No back pain. No muscle tenderness. Extremities:  No pain or swelling. Skin:  No increased bruising or bleeding.  No rashes or skin changes. Neuro:  Neuropathy in hands and feet (no change).  No headache, numbness or weakness, balance or coordination issues. Endocrine:  Diabetes on an insulin pump.  Blood sugar elevated on steroids.  Occasional hot flashes.  Psych:  No mood changes, depression or anxiety.  Sleeping more. Pain:  No focal pain.  Review of  systems:  All other systems reviewed and found to be negative.  Physical Exam:  Blood pressure (!) 149/70, pulse 90, temperature 97.8 F (36.6 C), temperature source Tympanic, resp. rate 20, weight 131 lb 9 oz (59.7 kg). GENERAL:  Well developed, well nourished woman sitting comfortably in the exam room in no acute distress. MENTAL STATUS:  Alert and oriented to person, place and time. HEAD:  Wearing a light silver blue cap.  Alopecia.  Normocephalic, atraumatic, face symmetric, no Cushingoid features. EYES:  Gold rimmed glasses.  Blue eyes.  No conjunctivitis or scleral icterus.  NEUROLOGICAL: Appropriate. PSYCH: Appropriate.   Radiology studies: 02/13/2015:  Abdomen and pelvic CT revealed bilateral mass-like adnexal regions (right adnexal mass 5.6 x 5.0 cm and the left adnexa mass 10.3 x 6.0 x 9.9 cm).  There was a large amount of soft tissue throughout the peritoneal cavity involving the omentum and other peritoneal surfaces.  There was a small volume ascites. There was a peripheral 3.3 x 1.9 cm low-attenuation lesion overlying the right lobe of the liver, likely representing a serosal implant. There was 1.4 x 2.2 cm ill-defined peripheral lesion within the inferior aspect of segment 6 adjacent to the ampulla in the duodenum.  04/28/2015:  Abdominal and pelvic CT revealed decreasing bilateral ovarian masses.   The left adnexal mass measured 4.0 x 6.1 cm (previously 5.0 x 8.5 cm). The right ovary measured 3.8 x 4.9 cm (previously 4.7 x 5.6 cm).  There was improved peritoneal carcinomatosis.  There was a small amount of ascites.  The hepatic dome lesion was stable. The previously seen right hepatic lobe lesion was not well-visualized.  There was a small left pleural effusion and trace right pleural effusion.  The nodular lesion at the ampulla of Vater, extending into the duodenum was stable.  There was no biliary ductal dilatation. 08/01/2015:  Rght upper extremity ultrasound revealed a near  occlusive thrombus within the central portion of the right internal jugular vein and central portion of the right subclavian vein. 09/01/2015:  Chest, abdomen, and pelvic CT revealed continued decrease in perihepatic fluid collection contiguous with the right pleural space with  percutaneous drain.  09/11/2015:  Chest, abdomen, and pelvic CT revealed a residual versus recurrent fluid collection posterior to the right hepatic lobe (2.6 x 1.1 x 4.0 cm).   10/13/2015:  Chest, abdomen, and pelvic CT revealed resolution of the empyema. 02/15/2016:  Chest, abdomen, and pelvic CT revealed multiple soft tissue nodules throughout the pelvis, small bowel mesenteric and peritoneum and, concerning for peritoneal metastatic disease.  There was soft tissue irregularity within the left upper quadrant.  There was mild right hydronephrosis (etiology unclear).  There was interval resolution of previously described right pleural based fluid and gas collection. There was a pleural based nodule within the right lower hemi-thorax concerning for pleural based metastasis.  There were small bilateral pleural effusions.  There was pulmonary nodularity, predominately within the left upper lobe (metastatic or infectious/inflammatory etiology).  There was slightly increased mediastinal adenopathy (infectious/inflammatory or metastatic).  There was a low attenuation lesion within the left hepatic lobe (complicated fluid within the fissure or metastatic disease). 03/08/2016:  Abdomen and pelvic CT revealed mild progression of peritoneal disease in the abdomen.  There was interval increase in loculated fluid around the lateral segment left liver, stomach, and spleen.  There was persistent soft tissue lesion at the level of the ampulla with mild intra and extrahepatic biliary duct dilatation.  There was persistent intrahepatic and capsular metastatic disease involving the liver. 05/26/2016:  Head MRI revealed multiple small areas of acute  infarct involving the occipital parietal lobe bilaterally and the left lateral cerebellum,  consistent with posterior circulation emboli.  06/10/2016:  Adomen and pelvic CT revealed progression of peritoneal carcinomatosis predominantly in the perihepatic space, bilateral lower quadrants and pelvis.  There was worsening bilateral obstructive uropathy due to malignant involvement of the pelvic ureters.  There was stable small pleural/subpleural nodules at the right lung base.  There was stable small left pleural effusion.  There was decreased small volume perihepatic ascites.  08/09/2016:  Abdomen and pelvic CT revealed interval increase and loculated fluid collections within the peritoneal space consistent with progression of peritoneal ovarian carcinoma metastasis.  There was one large 7 cm collection in the RIGHT lower quadrant which had increased significantly in size and may represent of point of small bowel obstruction.  The proximal stomach and duodenum were decompressed. The mid small bowel was mildly dilated. There was concern for obstruction given the large intraperitoneal fluid collection.  There was increase in subcapsular hepatic metastatic fluid collections.  There was enlargement of rounded ampullary lesion in the second portion duodenum concerning for metastatic lesion.  There was mild biliary duct dilatation (stable).  There was nodular pleural metastasis in the RIGHT lower lobe pleural space. 11/03/2016:  Abdomen and pelvic CT revealed multifocal cystic metastases in the abdomen/pelvis, reflecting peritoneal disease.  The dominant lesion in the left anterior abdomen has progressed, while additional lesions were mixed.  Peritoneal disease along the liver and spleen progressed.  Pleural-based metastases along the posterior right hemithorax progressed. There were trace left pleural effusion.  There was a stable 1.7 cm duodenal lesion at the ampulla. 11/29/2016:  Abdomen and pelvic CT revealed mild  interval enlargement of pleural metastasis the RIGHT lower lobe.  There was overall mild decrease in cystic peritoneal metastasis within the abdomen pelvis. The solid lesion at the terminal ileum was decreased in size. Subcapsular lesion in the liver was slightly decreased in size.  There was no evidence of disease progression the abdomen pelvis.  There was mild intrahepatic duct dilatation similar prior.  The ampullary lesion was slightly larger.  There was mild hydronephrosis and hydroureter on the LEFT likely related to cystic metastasis at the LEFT vesicoureteral junction 12/09/2016:  MRCP revealed the cystic lesion within the pancreatic head/uncinate process had decreased in size and demonstrated no suspicious characteristics. This can be presumed a pseudocyst or focus of side branch duct ectasia.  There was similar to slight progression of extensive peritoneal metastasis since 11/29/2016. Grossly similar abdominal nodal and right pleural metastasis.  There was chronic mild common duct dilatation with similar soft tissue fullness about the ampulla compared to 11/29/2016.  There was improved to resolved left-sided hydronephrosis. 01/20/2017:  Abdomen and pelvic CT revealed complex mixed interval changes in widespread metastatic disease in the peritoneal cavity, lower chest and thoracic, abdominal and pelvic lymph nodes. Right lower chest pleural metastasis and several of the peritoneal tumor implants had increased.  There was new right inguinal lymphadenopathy.  Some of the peritoneal tumor implants had decreased.  The small dependent left pleural effusion was slightly increased .  The mild left hydroureteronephrosis was improved.  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   The chronic biliary ductal dilatation was stable .  There was no evidence of bowel obstruction. 05/22/2017:  Chest, abdomen, and pelvic CT revealed  significant enlargement of metastatic disease involving the right pleura, low paratracheal  nodal chain, omentum, peritoneal surfaces, mesentery, and liver. Tumors extending through the ostomy site into the subcutaneous tissues. There were multiple new metastatic liver and right pleural lesions.  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  There was new sclerotic lesion in the T11 vertebral body, concerning for possible metastatic disease. There was healing anterior nondisplaced rib fractures of the right second through ninth ribs that appeared new. 07/10/2017:  Chest, abdomen and pelvic CT revealed RIGHT lower lobe pleural-based nodules, slightly smaller.  There was stable bilateral pleural effusions (left > right) and stable mediastinal adenopathy.  There was evidence of progression of disease in the abdomen pelvis.  There was interval increase in size of hepatic lesions most noticeably in the LEFT hepatic lobe.  There was interval increase in bilateral hydronephrosis and hydroureter.  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  There was interval increase in size of large peritoneal and pelvic peritoneal implants.  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  Admissions: [place] from 06/15/2015 - 06/22/2015.  She underwent exploratory laparotomy, lysis of adhesions, total abdominal hysterectomy with bilateral salpingo-oophorectomy, infracolic omentectomy, optimal tumor debulking(< 1 cm), recto-sigmoid resection with creation of end colostomy, cholecystectomy, mobilization of splenic flexure and liver with diaphragmatic stripping on 06/15/2015. The right diaphragm was cleared of tumor. During dissection, the diaphragm was entered and closed with sutures.  [place] from 07/01/2015 - 07/03/2015 with pleural effusion. [place] from 07/07/2015 - 07/09/2015 with recurrent pleural effusion. [place]  from 07/29/2015 - 08/10/2015 with a right-sided empyema and liver abscess. She underwent CT-guided placement of a liver abscess drain on 07/30/2015.  Liver abscess culture grew out group B strep and Enterobacter which was sensitive to Zosyn. She was transitioned to ertapenem Colbert Ewing) prior to discharge.  She was readmitted to [facility] on 09/17/2015. [place] from 05/26/2016 - 05/28/2016 with altered mental status and an acute embolic CVA.  Head MRI on 05/26/2016 revealed multiple small areas of acute infarct involving the occipital parietal lobe bilaterally and the left lateral  cerebellum,  consistent with posterior circulation emboli.  Work-up included a negative carotid ultrasound and echocardiogram. [facility] from 08/10/2016 - 08/14/2016 with a small bowel obstruction.  She was managed conservatively. [place] from 09/27/2016 - 09/28/2016 with symptomatic anemia and diarrhea.   Infusion on 07/10/2017  Component Date Value Ref Range Status  . Magnesium 07/10/2017 1.5* 1.7 - 2.4 mg/dL Final   Performed at [facility], [address], [place], [place] [address]  . Sodium 07/10/2017 137  135 - 145 mmol/L Final  . Potassium 07/10/2017 3.9  3.5 - 5.1 mmol/L Final  . Chloride 07/10/2017 102  101 - 111 mmol/L Final  . CO2 07/10/2017 29  22 - 32 mmol/L Final  . Glucose, Bld 07/10/2017 189* 65 - 99 mg/dL Final  . BUN 07/10/2017 14  6 - 20 mg/dL Final  . Creatinine, Ser 07/10/2017 0.73  0.44 - 1.00 mg/dL Final  . Calcium 07/10/2017 8.9  8.9 - 10.3 mg/dL Final  . Total Protein 07/10/2017 6.6  6.5 - 8.1 g/dL Final  . Albumin 07/10/2017 3.3* 3.5 - 5.0 g/dL Final  . AST 07/10/2017 15  15 - 41 U/L Final  . ALT 07/10/2017 9* 14 - 54 U/L Final  . Alkaline Phosphatase 07/10/2017 125  38 - 126 U/L Final  . Total Bilirubin 07/10/2017 0.7  0.3 - 1.2 mg/dL Final  . GFR calc non Af Amer 07/10/2017 >60  >60 mL/min Final  . GFR calc Af Amer 07/10/2017 >60  >60 mL/min Final   Comment: (NOTE) The eGFR has been  calculated using the CKD EPI equation. This calculation has not been validated in all clinical situations. eGFR's persistently <60 mL/min signify possible Chronic Kidney Disease.   Georgiann Hahn gap 07/10/2017 6  5 - 15 Final   Performed at [facility], [address] [place],  [address]  . WBC 07/10/2017 5.6  3.6 - 11.0 K/uL Final  . RBC 07/10/2017 2.57* 3.80 - 5.20 MIL/uL Final  . Hemoglobin 07/10/2017 7.9* 12.0 - 16.0 g/dL Final  . HCT 07/10/2017 23.8* 35.0 - 47.0 % Final  . MCV 07/10/2017 92.4  80.0 - 100.0 fL Final  . MCH 07/10/2017 30.6  26.0 - 34.0 pg Final  . MCHC 07/10/2017 33.2  32.0 - 36.0 g/dL Final  . RDW 07/10/2017 19.6* 11.5 - 14.5 % Final  . Platelets 07/10/2017 298  150 - 440 K/uL Final  . Neutrophils Relative % 07/10/2017 75  % Final  . Neutro Abs 07/10/2017 4.2  1.4 - 6.5 K/uL Final  . Lymphocytes Relative 07/10/2017 13  % Final  . Lymphs Abs 07/10/2017 0.7* 1.0 - 3.6 K/uL Final  . Monocytes Relative 07/10/2017 10  % Final  . Monocytes Absolute 07/10/2017 0.6  0.2 - 0.9 K/uL Final  . Eosinophils Relative 07/10/2017 1  % Final  . Eosinophils Absolute 07/10/2017 0.0  0 - 0.7 K/uL Final  . Basophils Relative 07/10/2017 1  % Final  . Basophils Absolute 07/10/2017 0.0  0 - 0.1 K/uL Final   Performed at [facility], [address] [place],  [address]    Assessment:  LETASHA KERSHAW is a 72 y.o. female with progressive stage IV ovarian cancer.  She presented with abdominal discomfort and bloating.  Omental biopsy on 02/23/2015 revealed metastatic high grade serous carcinoma, consistent with gynecologic origin.   She was initially diagnosed with clinical stage IIIC (T3cN1Mx).  CA125 was 707 on 02/17/2015.  She received 4 cycles of  neoadjuvant carboplatin and Taxol (03/05/2015 - 05/22/2015).  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  She had loose stools on oral magnesium.  She was initially on Neurontin then switched Lyrica with  cycle #3.  Cycle #4 was notable for neutropenia (ANC 300) requiring GCSF x 3 days.    She underwent exploratory laparotomy, lysis of adhesions, total abdominal hysterectomy with bilateral salpingo-oophorectomy, infracolic omentectomy, optimal tumor debulking(< 1 cm), recto-sigmoid resection with creation of end colostomy, cholecystectomy, mobilization of splenic flexure and liver with diaphragmatic stripping on 06/15/2015. The right diaphragm was cleared of tumor. During dissection, the diaphragm was entered and closed with sutures.  She received tamoxifen from 02/25/2016 - 03/14/2016.  She received 4 cycles of carboplatin and gemcitabine (03/21/2016 - 05/24/2016) with GCSF/Neulasta support.  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  Cymbalta began on 02/24/2017  PDL-1 testing revealed a combined score was 5 (>=1 positive).     [facility] genetic testing negative for APC, ATM, BMPR1A, BRCA1, BRCA2, BRIP1, CDH1, CDK4, CDKN2A, CHEK2, EPCAM, MLH1, MSH2, MSH6, MUTYH, NBN, PALB2, PMS2, PTEN, RAD51C, RAD51D, SMAD4, STK11, TP53. Sequencing for select regions of POLE and POLD1, and large rearrangement analysis for select regions of GREM1 were negative.  She received 2 cycles of Doxil (06/21/2016 - 07/19/2016) with Neulasta support.  She received 4 cycles of topotecan with Neulasta support (08/22/2016 - 10/10/2016).    She received 3 cycles of pembrolizumab (Keytruda) from 12/06/2016 -  01/09/2017.  Cycle #1 was complicated by neutropenia (ANC 400) requiring GCSF/Granix x 3 days.  She received GCSF x 5 days with cycle #2.  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 Right lower chest pleural metastasis and several of the peritoneal tumor implants had increased.  There was new right inguinal lymphadenopathy.  Some of the peritoneal tumor  implants had decreased.  The small dependent left pleural effusion was slightly increased .  The mild left hydroureteronephrosis was improved.  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   The chronic biliary ductal dilatation was stable .  There was no evidence of bowel obstruction.  CA125 was 802.9 on 03/30/2015, 567.9 on 04/13/2015, 168.8 on 05/15/2015, 85.2 on 07/17/2015, 68.6 on 07/28/2015, 34.5 on 09/17/2015, 20.7 on 11/06/2015, 49 on 01/22/2016, 106.1 on 02/22/2016, 138.8 on 03/07/2016, 220.8 on 03/21/2016, 152.8 on 04/11/2016, 125.6 on 04/18/2016, 76.8 on 05/03/2016, 60.2 on 05/24/2016, 44 on 07/19/2016, 65.8 on 08/17/2016, 53.4 on 09/12/2016, 47.3 on 10/10/2016, 91 on 12/20/2016, 141.1 on 01/09/2017, 463.7 on 02/27/2017, 907.1 on 03/27/2017, 192.1 on 05/22/2017, 189.1 on 05/24/2017, and 144.3 on 06/19/2017.  She has a history of recurrent right sided pleural effusion.  She underwent thoracentesis of 650 cc on post-operative day 3.  She was admitted to [facility] on 07/01/2015 and 07/07/2015 for recurrent shortness of breath.  She underwent 2 additional thoracenteses (1.1 L on 07/02/2015 and 850 cc on 07/08/2015).  Cytology was negative x 2.  Bilateral lower extremity duplex on 07/03/2015 was negative.  Echo revealed an EF of 55-60% on 07/08/2015 and 60-65% on 05/27/2016.    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 She was on Lovenox 60 mg twice a day.  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  She has severe rheumatoid arthritis.  Methotrexate and Enbrel were initially on hold.  She has a normocytic anemia.  Work-up on 02/17/2015 and 05/24/2016 revealed a normal ferritin, B12, folate, TSH.  She denies any melena or hematochezia.    She has anemia due to chronic disease. She received 1 unit PRBCs during her  admission at [facility]. She denies any melena or hematochezia. She has diabetes and is on an insulin pump.  She has had persistent neutropenia felt secondary to her rheumatoid arthritis.  Folate and MMA were normal.  TSH was 6.13 (high) with a free T4 of 1.17 (0.61-1.12).  She began methotrexate (10 mg a week) and prednisone (5 mg a day) for severe rheumatoid arthritis on 12/17/2015.  She remains on prednisone alone (5 mg a day).  Bone marrow aspirate and biopsy on 06/09/2010 revealed a hypercellular marrow (70%) with no evidence of dysplasia or malignancy.  Flow cytometry was negative.  Cytogenetics were normal (46,XX).  FISH studies were negative for MDS.  Bone marrow aspirate and biopsy on 12/03/2015 revealed a normocellular to mildy hypercellular marrow for age (40%) with left shifted myelopoiesis, non specific dyserythropoiesis and mild megakaryocytic atypia with no increase in blasts.  There were multiple small nonspecific lymphoid aggregates (favor reactive). There was no increase in reticulin.  There was decreased myeloid cells (37%) with left shifted maturation and 1% atypical myelod blasts.  There was relatively increased monocytic cells (11%), relatively increased lymphoid cells (36%), and relatively increased eosinophils (6%).  Cytogenetics were normal (51, XX).  SNP microarray was normal.  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  She receives IV magnesium twice weekly.    She has chemotherapy induced anemia.  She has received Procrit (last 06/26/2017).  She received 1 unit of PRBCs on 06/05/2017.  Labs on 08/22/2016 revealed a normal B12 and folate.  Ferritin was 201, iron saturation 21% and TIBC 307 on 06/09/2017.  B12 and folate were normal.  Code status is DNR/DNI.  She developed acute LFT elevation on 11/28/2016.  Event was preceded by upper abdominal discomfort.  Hepatitis B core antibody total and hepatitis C antibody were negative.  She is s/p day 8 of cycle #5  Abraxane (03/13/2017 - 06/26/2017).  Cycle #1 was truncated after day 1 secondary to side effects and Thanksgiving.  Chest, abdomen and pelvic CT on 07/10/2017 revealed progression of disease in the abdomen pelvis.  There was interval increase in size of hepatic lesions most noticeably in the LEFT hepatic lobe.  There was interval increase in bilateral hydronephrosis and hydroureter.  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  There was interval increase in size of large peritoneal and pelvic peritoneal implants.  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  Symptomatically, she has abdominal discomfort.  Exam reveals nodularity/fullness under inferior rib border and nodularity around stoma.   Plan: 1.  Discuss interval scans and documentation of progressive disease. 2.  Discuss bilateral hydronephrosis and hydroureter.  Discuss prior conversation with Dr. Erlene Quan and consideration of internal versus external stents. 3.  Discontinue Abraxane.  No further chemotherapy. 4.  Referral to Dr. Erlene Quan (urology). 5.  Referral to Hospice. Discuss supportive care. Multiple questions were asked ans answered. 6.  Keep all appointments scheduled for this week. 7.  RTC in 1 week for MD assessment and labs (CBC with diff, BMP, Mg) and IV Mg.    Lequita Asal, MD  07/11/2017, 4:35 PM

## 2017-07-12 ENCOUNTER — Encounter
Admission: RE | Admit: 2017-07-12 | Discharge: 2017-07-12 | Disposition: A | Discharge status: Home / Self Care | Payer: Medicare Other | Source: Ambulatory Visit | Attending: Urology | Admitting: Urology

## 2017-07-12 ENCOUNTER — Ambulatory Visit: Payer: Medicare Other | Admitting: Urology

## 2017-07-12 ENCOUNTER — Other Ambulatory Visit: Payer: Self-pay

## 2017-07-12 ENCOUNTER — Encounter: Payer: Self-pay | Admitting: Urology

## 2017-07-12 ENCOUNTER — Other Ambulatory Visit: Payer: Self-pay | Admitting: Radiology

## 2017-07-12 VITALS — BP 95/55 | HR 86 | Ht 63.0 in | Wt 131.0 lb

## 2017-07-12 DIAGNOSIS — N1330 Unspecified hydronephrosis: Secondary | ICD-10-CM | POA: Diagnosis not present

## 2017-07-12 HISTORY — DX: Other allergic rhinitis: J30.89

## 2017-07-12 HISTORY — DX: Depression, unspecified: F32.A

## 2017-07-12 HISTORY — DX: Major depressive disorder, single episode, unspecified: F32.9

## 2017-07-12 HISTORY — DX: Cerebral infarction, unspecified: I63.9

## 2017-07-12 HISTORY — DX: Unspecified osteoarthritis, unspecified site: M19.90

## 2017-07-12 HISTORY — DX: Polyneuropathy, unspecified: G62.9

## 2017-07-12 HISTORY — DX: Crossing vessel and stricture of ureter without hydronephrosis: N13.5

## 2017-07-12 NOTE — H&P (View-Only) (Signed)
07/12/2017 5:41 PM   [name] [birth year] [id number]  Referring provider: Leone Haven, MD [address] [place], [place] [address]  Chief Complaint  Patient presents with  . Hydronephrosis    HPI: 72 year old female with progressive stage IV ovarian cancer managed by Dr. Mike Gip who returns the office today.  She was previously seen approximately 1 year ago with worsening bilateral hydronephrosis, left greater than right.  At that time, her creatinine was preserved without flank pain.  As such, the nephrostomy tube and stent were deferred.  She responded fairly well to 2 cycles of Doxil with Neulasta followed by 4 cycles of topotecan can with Neulasta and most recently [facility] with improvement of her left hydronephrosis but progression in other areas.    She is now on cycle 4 of abraxane.  More recently, she has had significant radiographic progression with worsening again of her left hydronephrosis.  She is a right extrarenal pelvis.  Her creatinine remained stable.  It does not appear that her tumor markers correlate with her radiographic progression on CT scan.  She does report occasional intermittent left flank pain but this is not particularly bothersome.  She has no significant urinary symptoms.  No gross hematuria.  Interval history: She returns to the office now having completed her most recent cycle of chemotherapy.  She underwent further evaluation with CT abdomen pelvis with contrast on 07/10/2017.  This now shows progression with right severe hydroureteronephrosis as well as progression of her left-sided nephrosis.  Her creatinine remained stabilized.  In light of progression of worsening urinary obstruction, she returns back to urology to discuss management of her impending compromised urinary tract.  She does understand that her overall prognosis is poor.  She understands that without intervention, her kidneys will likely decline in function and  ultimately lead to her demise.  With preservation of her renal function, her prognosis is less than 6 months per Dr. Mike Gip.  No further chemotherapy is planned.   PMH: Past Medical History:  Diagnosis Date  . Anemia   . Arthritis   . CAD (coronary artery disease)    a. 09/2012 Cath: LM nl, LAD 95p, 59m LCX 925mOM2 50, RCA 100.  . Marland KitchenHF (congestive heart failure) ([facility]  . Cholelithiasis   . Collagen vascular disease (HCC)    Rheumatoid Arthritis.  . Depression   . Environmental and seasonal allergies   . Esophageal stricture   . Exertional shortness of breath   . GERD (gastroesophageal reflux disease)   . H/O hiatal hernia   . Herniated disc   . History of pancytopenia   . Hyperlipidemia   . Hypertension   . Hypokalemia   . Leukopenia 2012   s/p bone marrow biopsy, Dr. PaMa Hillock. Neuropathy   . NSTEMI (non-ST elevated myocardial infarction) ([facility]5/2014   "mild" (10/18/2012)  . Ovarian cancer ([facility]2016   chemo and hysterectomy  . Pneumonia 2013; 08/2012   "one lung; double" (10/18/2012)  . Rheumatoid arthritis(714.0)   . Stroke ([facility]  . Type I diabetes mellitus ([facility]   "dx'd in 1957" (10/18/2012)  . Ureter obstruction     Surgical History: Past Surgical History:  Procedure Laterality Date  . ABDOMINAL HYSTERECTOMY  06/15/2015   Dx L/S, EXLAP TAH BSO omentectomy RSRx colostomy diaphragm resection stripping  . CARDIAC CATHETERIZATION  10/18/2012   "first one was today" (10/18/2012)  . CATARACT EXTRACTION W/ INTRAOCULAR LENS IMPLANT Right 2010  . CHOLECYSTECTOMY  06/15/2015   combined case with ovarian cancer debulking  . COLON SURGERY    . CORONARY ARTERY BYPASS GRAFT N/A 10/19/2012   Procedure: CORONARY ARTERY BYPASS GRAFTING (CABG);  Surgeon: Melrose Nakayama, MD;  Location: [place];  Service: Open Heart Surgery;  Laterality: N/A;  . ESOPHAGEAL DILATION     "3 or 4 times" (10/19/2011)  . ESOPHAGOGASTRODUODENOSCOPY  2012   Dr. Minna Merritts  . OSTOMY    . OVARY SURGERY      removal  . PERIPHERAL VASCULAR CATHETERIZATION N/A 03/02/2015   Procedure: Glori Luis Cath Insertion;  Surgeon: Algernon Huxley, MD;  Location: [place] [facility];  Service: Cardiovascular;  Laterality: N/A;  . TUBAL LIGATION  1970    Home Medications:  Allergies as of 07/12/2017      Reactions   Codeine Nausea And Vomiting   Latex Rash      Medication List        Accurate as of 07/12/17  5:41 PM. Always use your most recent med list.          Calcium-Vitamin D 600-200 MG-UNIT tablet Take 1 tablet by mouth 2 (two) times daily.   cholecalciferol 1000 units tablet Commonly known as:  VITAMIN D Take 1,000 Units by mouth daily.   DULoxetine 20 MG capsule Commonly known as:  CYMBALTA Take 1 capsule (20 mg total) by mouth daily.   enoxaparin 60 MG/0.6ML injection Commonly known as:  LOVENOX INJECT 0.6ML (60MG TOTAL) UNDER THE SKIN EVERY 12 HOURS   folic acid 1 MG tablet Commonly known as:  FOLVITE Take 1 tablet (1 mg total) by mouth daily.   furosemide 40 MG tablet Commonly known as:  LASIX Take 1 tablet (40 mg total) by mouth daily as needed for edema.   insulin lispro 100 UNIT/ML injection Commonly known as:  HUMALOG Inject 0.06 mLs (6 Units total) into the skin daily.   lidocaine-prilocaine cream Commonly known as:  EMLA APPLY 1 APPLICATION TOPICALLY AS NEEDED 1 HOUR PRIOR TO TREATMENT. COVER WITH PRESS N SEAL UNTIL TREATMENT TIME AS DIRECTED   loratadine 10 MG tablet Commonly known as:  CLARITIN Take 1 tablet by mouth daily as needed.   magnesium oxide 400 (241.3 Mg) MG tablet Commonly known as:  MAG-OX TAKE 1 TABLET BY MOUTH ONCE DAILY   metoprolol tartrate 25 MG tablet Commonly known as:  LOPRESSOR Take 0.5 tablets (12.5 mg total) by mouth daily after breakfast.   ondansetron 8 MG tablet Commonly known as:  ZOFRAN TAKE 1 TABLET BY MOUTH EVERY 8 HOURS AS NEEDED FOR NAUSEA AND VOMITING   pantoprazole 40 MG tablet Commonly known as:  PROTONIX TAKE 1  TABLET BY MOUTH TWICE (2) DAILY   potassium chloride SA 20 MEQ tablet Commonly known as:  K-DUR,KLOR-CON TAKE 1 TABLET BY MOUTH ONCE A DAY   predniSONE 5 MG tablet Commonly known as:  DELTASONE Take 5 mg by mouth daily with breakfast.   vitamin C 500 MG tablet Commonly known as:  ASCORBIC ACID Take 500 mg by mouth daily.   zinc gluconate 50 MG tablet Take 50 mg daily by mouth.       Allergies:  Allergies  Allergen Reactions  . Codeine Nausea And Vomiting  . Latex Rash    Family History: Family History  Problem Relation Age of Onset  . Diabetes Mother   . Arthritis Mother   . Diabetes Father   . Arthritis Father   . Aneurysm Sister  neck  . Cancer Sister        lung    Social History:  reports that she quit smoking about 23 years ago. Her smoking use included cigarettes. She has a 30.00 pack-year smoking history. she has never used smokeless tobacco. She reports that she does not drink alcohol or use drugs.  ROS: UROLOGY Frequent Urination?: No Hard to postpone urination?: Yes Burning/pain with urination?: No Get up at night to urinate?: No Leakage of urine?: Yes Urine stream starts and stops?: Yes Trouble starting stream?: No Do you have to strain to urinate?: No Blood in urine?: No Urinary tract infection?: No Sexually transmitted disease?: No Injury to kidneys or bladder?: No Painful intercourse?: No Weak stream?: No Currently pregnant?: No Vaginal bleeding?: No Last menstrual period?: n  Gastrointestinal Nausea?: Yes Vomiting?: No Indigestion/heartburn?: Yes Diarrhea?: Yes Constipation?: No  Constitutional Fever: No Night sweats?: No Weight loss?: No Fatigue?: Yes  Skin Skin rash/lesions?: No Itching?: No  Eyes Blurred vision?: Yes Double vision?: No  Ears/Nose/Throat Sore throat?: No Sinus problems?: Yes  Hematologic/Lymphatic Swollen glands?: No Easy bruising?: Yes  Cardiovascular Leg swelling?: No Chest pain?:  No  Respiratory Cough?: No Shortness of breath?: Yes  Endocrine Excessive thirst?: No  Musculoskeletal Back pain?: No Joint pain?: No  Neurological Headaches?: No Dizziness?: No  Psychologic Depression?: No Anxiety?: No  Physical Exam: BP (!) 95/55   Pulse 86   Ht 5' 3"  (1.6 m)   Wt 131 lb (59.4 kg)   BMI 23.21 kg/m   Constitutional:  Alert and oriented, No acute distress.  Accompanied by husband today.  Pale appearing, somewhat frail. HEENT: [place] AT, moist mucus membranes.  Trachea midline, no masses. Cardiovascular: No clubbing, cyanosis, or edema. Respiratory: Normal respiratory effort, no increased work of breathing. GU: No CVA tenderness.  Neurologic: Grossly intact, no focal deficits, moving all 4 extremities. Psychiatric: Normal mood and affect.  Laboratory Data: Lab Results  Component Value Date   WBC 5.6 07/10/2017   HGB 7.9 (L) 07/10/2017   HCT 23.8 (L) 07/10/2017   MCV 92.4 07/10/2017   PLT 298 07/10/2017    Lab Results  Component Value Date   CREATININE 0.73 07/10/2017   Lab Results  Component Value Date   HGBA1C 6.5 (H) 05/28/2016    Urinalysis N/a  Pertinent Imaging: CLINICAL DATA:  Ovarian carcinoma. Increased abdominal pain. RIGHT upper quadrant pain.  EXAM: CT CHEST, ABDOMEN, AND PELVIS WITH CONTRAST  TECHNIQUE: Multidetector CT imaging of the chest, abdomen and pelvis was performed following the standard protocol during bolus administration of intravenous contrast.  CONTRAST:  161m ISOVUE-300 IOPAMIDOL (ISOVUE-300) INJECTION 61%  COMPARISON:  CT 05/22/2017  FINDINGS: CT CHEST FINDINGS  Cardiovascular: Port in the anterior chest wall with tip in distal SVC. Coronary artery calcification and aortic atherosclerotic calcification.  Mediastinum/Nodes: No axillary adenopathy.  RIGHT lower paratracheal adenopathy measuring up to 16 mm short axis unchanged. No pericardial effusion. Esophagus normal.  Lungs/Pleura:  Pleural-based nodule in the RIGHT lower lobe measures 19 mm x 25 mm compared with 20 mm by 22 mm. Inferior RIGHT lower lobe pleural-based mass measuring 38 mm by 20 mm compares to 42 x 20 mm.  Moderate LEFT effusions unchanged.  No new nodularity.  Musculoskeletal: No aggressive osseous lesion.  CT ABDOMEN AND PELVIS FINDINGS  Hepatobiliary: Widespread hepatic metastasis again noted. Lesions are within the parenchyma along the peritoneal surfaces. Group of lesions in the central LEFT hepatic lobe are increased. Two adjacent lesions measure 16 mm  and combined diameter compared to 43 on prior (image 57, series 2 subcapsular lesion the RIGHT hepatic lobe measures 61 by 37 mm compared with 61 by 33 mm. Lesions central RIGHT hepatic lobe adjacent to the gallbladder clips measures 39 mm diameter compared with 36 mm. No discrete new lesions are evident. The most dramatic progression is within the LEFT hepatic lobe  Pancreas: Pancreas is normal. No ductal dilatation. No pancreatic inflammation.  Spleen: Subcapsular lesion in the posterior aspect of the spleen is more prominent (image 54, series 2) measuring 27 mm in depth compared to 19 mm.  Adrenals/urinary tract: Adrenal glands are normal.  There is interval increase in RIGHT hydronephrosis with the renal pelvis measuring 59 mm compared to 32 mm. Increase in hydroureter on the RIGHT. Again demonstrated hydronephrosis hydroureter on the LEFT collecting system which is similar prior. The LEFT ureter measures 25 mm (image 81, series 2) compared to 25 mm. Bladder grossly normal  Stomach/Bowel: Stomach, small-bowel and cecum normal. There is no small bowel obstruction. LEFT abdominal wall colostomy. No colonic obstruction.  Vascular/Lymphatic: Abdominal aorta is heavily calcified. No retroperitoneal adenopathy. No pelvic adenopathy  Reproductive: Post hysterectomy  Other: Bulky peritoneal metastasis again demonstrated.  Dominant lesion in the LEFT pelvis measures 93 mm by 78 mm compared with 85 x 67 mm for some increased in size. RIGHT pelvic nodal mass peritoneal mass measures 28 mm increased from 23 mm (image 98, series 2). Presumably these pelvic peritoneal masses obstruct the ureters as above.  Multiple peritoneal nodules along the ventral peritoneal space as well as along the ventral peritoneal surface of the abdomen and extending into the subcutaneous tissue adjacent to the ostomy with minimal change.  Musculoskeletal: No aggressive osseous lesion.  IMPRESSION: Chest Impression:  1. RIGHT lower lobe pleural-based nodules measure slightly smaller. 2. Stable bilateral pleural effusions LEFT greater than RIGHT. 3. Stable mediastinal adenopathy.  Abdomen / Pelvis Impression:  1. Evidence of progression of disease in the abdomen pelvis. 2. Interval increase in size of hepatic lesions most noticeably in the LEFT hepatic lobe. 3. Interval increase in bilateral hydronephrosis and hydroureter. 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 4. Interval increase in size of large peritoneal and pelvic peritoneal implants. 5. 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  These results will be called to the ordering clinician or representative by the Radiologist Assistant, and communication documented in the PACS or zVision Dashboard.   Electronically Signed   By: Suzy Bouchard M.D.   On: 07/10/2017 15:21  CT scan was personally reviewed today and with the patient and her husband.  Assessment & Plan:    1. Hydronephrosis of bilateral kidneys Progression to severe hydroureteronephrosis bilaterally, right greater than left Renal function continues to be preserved Her overall prognosis is poor no further chemotherapy is planned In the setting of progressive urinary obstruction, it is extremely likely  that her renal function will begin to decline and lead to her demise We discussed that ureteral stent versus percutaneous nephrostomy tubes could likely prolong her life somewhat She is not interested in percutaneous nephrostomy tubes due to concern for pain and discomfort from the tubes She is interested in pursuing bilateral ureteral stents with the understanding that the stents may not be effective if there is significant extrinsic compression from her tumor The option of hospice with no intervention was also discussed today as well as the natural history of renal failure  She would like to  proceed to the operating room tomorrow for cystoscopy, retrograde pyelogram, and bilateral ureteral stent placement.  Risk and benefits were discussed in detail including risk of bleeding, infection, damage to surrounding structures, irritation from the stent.  She is also aware of the risk of failure of the stents.  We will likely have her follow-up in 1 week with renal ultrasound prior as well as labs to ensure that the stents are functioning well.  All questions answered.  Hollice Espy, MD  [facility] [address] [place], [place] [address] [phone]

## 2017-07-12 NOTE — Patient Instructions (Signed)
Your procedure is scheduled on: 07/13/17 Thurs @ 6:15 am Report to Same Day Surgery 2nd floor medical mall [facility] Entrance-take elevator on left to 2nd floor.  Check in with surgery information desk.)   Remember: Instructions that are not followed completely may result in serious medical risk, up to and including death, or upon the discretion of your surgeon and anesthesiologist your surgery may need to be rescheduled.    _x___ 1. Do not eat food after midnight the night before your procedure. You may drink clear liquids up to 2 hours before you are scheduled to arrive at the hospital for your procedure.  Do not drink clear liquids within 2 hours of your scheduled arrival to the hospital.  Clear liquids include  --Water or Apple juice without pulp  --Clear carbohydrate beverage such as ClearFast or Gatorade  --Black Coffee or Clear Tea (No milk, no creamers, do not add anything to                  the coffee or Tea Type 1 and type 2 diabetics should only drink water.  No gum chewing or hard candies.     __x__ 2. No Alcohol for 24 hours before or after surgery.   __x__3. No Smoking or e-cigarettes for 24 prior to surgery.  Do not use any chewable tobacco products for at least 6 hour prior to surgery   ____  4. Bring all medications with you on the day of surgery if instructed.    __x__ 5. Notify your doctor if there is any change in your medical condition     (cold, fever, infections).    x___6. On the morning of surgery brush your teeth with toothpaste and water.  You may rinse your mouth with mouth wash if you wish.  Do not swallow any toothpaste or mouthwash.   Do not wear jewelry, make-up, hairpins, clips or nail polish.  Do not wear lotions, powders, or perfumes. You may wear deodorant.  Do not shave 48 hours prior to surgery. Men may shave face and neck.  Do not bring valuables to the hospital.    [facility] is not responsible for any belongings or valuables.      Contacts, dentures or bridgework may not be worn into surgery.  Leave your suitcase in the car. After surgery it may be brought to your room.  For patients admitted to the hospital, discharge time is determined by your                       treatment team.  _  Patients discharged the day of surgery will not be allowed to drive home.  You will need someone to drive you home and stay with you the night of your procedure.    Please read over the following fact sheets that you were given:   [facility] Preparing for Surgery and or MRSA Information   _x___ Take anti-hypertensive listed below, cardiac, seizure, asthma,     anti-reflux and psychiatric medicines. These include:  1. DULoxetine (CYMBALTA) 20 MG capsule  2.metoprolol tartrate (LOPRESSOR) 25 MG tablet  3.pantoprazole (PROTONIX) 40 MG tablet  4.predniSONE (DELTASONE) 5 MG tablet  5.  6.  ____Fleets enema or Magnesium Citrate as directed.   ____ Use CHG Soap or sage wipes as directed on instruction sheet   ____ Use inhalers on the day of surgery and bring to hospital day of surgery  ____ Stop Metformin and Janumet 2  days prior to surgery.    ____ Take 1/2 of usual insulin dose the night before surgery and none on the morning     surgery.   _x___ Follow recommendations from Cardiologist, Pulmonologist or PCP regarding          stopping Aspirin, Coumadin, Plavix ,Eliquis, Effient, or Pradaxa, and Pletal.  X____Stop Anti-inflammatories such as Advil, Aleve, Ibuprofen, Motrin, Naproxen, Naprosyn, Goodies powders or aspirin products. OK to take Tylenol and                          Celebrex.   _x___ Stop supplements until after surgery.  But may continue Vitamin D, Vitamin B,       and multivitamin.   ____ Bring C-Pap to the hospital.

## 2017-07-12 NOTE — Progress Notes (Signed)
07/12/2017 5:41 PM   [name] 03-31-1946 [id number]  Referring provider: Leone Haven, MD [address] [place], [place] [address]  Chief Complaint  Patient presents with  . Hydronephrosis    HPI: 72 year old female with progressive stage IV ovarian cancer managed by Dr. Mike Gip who returns the office today.  She was previously seen approximately 1 year ago with worsening bilateral hydronephrosis, left greater than right.  At that time, her creatinine was preserved without flank pain.  As such, the nephrostomy tube and stent were deferred.  She responded fairly well to 2 cycles of Doxil with Neulasta followed by 4 cycles of topotecan can with Neulasta and most recently [facility] with improvement of her left hydronephrosis but progression in other areas.    She is now on cycle 4 of abraxane.  More recently, she has had significant radiographic progression with worsening again of her left hydronephrosis.  She is a right extrarenal pelvis.  Her creatinine remained stable.  It does not appear that her tumor markers correlate with her radiographic progression on CT scan.  She does report occasional intermittent left flank pain but this is not particularly bothersome.  She has no significant urinary symptoms.  No gross hematuria.  Interval history: She returns to the office now having completed her most recent cycle of chemotherapy.  She underwent further evaluation with CT abdomen pelvis with contrast on 07/10/2017.  This now shows progression with right severe hydroureteronephrosis as well as progression of her left-sided nephrosis.  Her creatinine remained stabilized.  In light of progression of worsening urinary obstruction, she returns back to urology to discuss management of her impending compromised urinary tract.  She does understand that her overall prognosis is poor.  She understands that without intervention, her kidneys will likely decline in function and  ultimately lead to her demise.  With preservation of her renal function, her prognosis is less than 6 months per Dr. Mike Gip.  No further chemotherapy is planned.   PMH: Past Medical History:  Diagnosis Date  . Anemia   . Arthritis   . CAD (coronary artery disease)    a. 09/2012 Cath: LM nl, LAD 95p, 47m LCX 983mOM2 50, RCA 100.  . Marland KitchenHF (congestive heart failure) (HCLeeper  . Cholelithiasis   . Collagen vascular disease (HCC)    Rheumatoid Arthritis.  . Depression   . Environmental and seasonal allergies   . Esophageal stricture   . Exertional shortness of breath   . GERD (gastroesophageal reflux disease)   . H/O hiatal hernia   . Herniated disc   . History of pancytopenia   . Hyperlipidemia   . Hypertension   . Hypokalemia   . Leukopenia 2012   s/p bone marrow biopsy, Dr. PaMa Hillock. Neuropathy   . NSTEMI (non-ST elevated myocardial infarction) (HCS[place]5/2014   "mild" (10/18/2012)  . Ovarian cancer (HCFort Lu[facility]2016   chemo and hysterectomy  . Pneumonia 2013; 08/2012   "one lung; double" (10/18/2012)  . Rheumatoid arthritis(714.0)   . Stroke ([facility]  . Type I diabetes mellitus ([facility]   "dx'd in 1957" (10/18/2012)  . Ureter obstruction     Surgical History: Past Surgical History:  Procedure Laterality Date  . ABDOMINAL HYSTERECTOMY  06/15/2015   Dx L/S, EXLAP TAH BSO omentectomy RSRx colostomy diaphragm resection stripping  . CARDIAC CATHETERIZATION  10/18/2012   "first one was today" (10/18/2012)  . CATARACT EXTRACTION W/ INTRAOCULAR LENS IMPLANT Right 2010  . CHOLECYSTECTOMY  06/15/2015   combined case with ovarian cancer debulking  . COLON SURGERY    . CORONARY ARTERY BYPASS GRAFT N/A 10/19/2012   Procedure: CORONARY ARTERY BYPASS GRAFTING (CABG);  Surgeon: Melrose Nakayama, MD;  Location: [facility];  Service: Open Heart Surgery;  Laterality: N/A;  . ESOPHAGEAL DILATION     "3 or 4 times" (10/19/2011)  . ESOPHAGOGASTRODUODENOSCOPY  2012   Dr. Minna Merritts  . OSTOMY    . OVARY SURGERY      removal  . PERIPHERAL VASCULAR CATHETERIZATION N/A 03/02/2015   Procedure: Glori Luis Cath Insertion;  Surgeon: Algernon Huxley, MD;  Location: [facility];  Service: Cardiovascular;  Laterality: N/A;  . TUBAL LIGATION  1970    Home Medications:  Allergies as of 07/12/2017      Reactions   Codeine Nausea And Vomiting   Latex Rash      Medication List        Accurate as of 07/12/17  5:41 PM. Always use your most recent med list.          Calcium-Vitamin D 600-200 MG-UNIT tablet Take 1 tablet by mouth 2 (two) times daily.   cholecalciferol 1000 units tablet Commonly known as:  VITAMIN D Take 1,000 Units by mouth daily.   DULoxetine 20 MG capsule Commonly known as:  CYMBALTA Take 1 capsule (20 mg total) by mouth daily.   enoxaparin 60 MG/0.6ML injection Commonly known as:  LOVENOX INJECT 0.6ML (60MG TOTAL) UNDER THE SKIN EVERY 12 HOURS   folic acid 1 MG tablet Commonly known as:  FOLVITE Take 1 tablet (1 mg total) by mouth daily.   furosemide 40 MG tablet Commonly known as:  LASIX Take 1 tablet (40 mg total) by mouth daily as needed for edema.   insulin lispro 100 UNIT/ML injection Commonly known as:  HUMALOG Inject 0.06 mLs (6 Units total) into the skin daily.   lidocaine-prilocaine cream Commonly known as:  EMLA APPLY 1 APPLICATION TOPICALLY AS NEEDED 1 HOUR PRIOR TO TREATMENT. COVER WITH PRESS N SEAL UNTIL TREATMENT TIME AS DIRECTED   loratadine 10 MG tablet Commonly known as:  CLARITIN Take 1 tablet by mouth daily as needed.   magnesium oxide 400 (241.3 Mg) MG tablet Commonly known as:  MAG-OX TAKE 1 TABLET BY MOUTH ONCE DAILY   metoprolol tartrate 25 MG tablet Commonly known as:  LOPRESSOR Take 0.5 tablets (12.5 mg total) by mouth daily after breakfast.   ondansetron 8 MG tablet Commonly known as:  ZOFRAN TAKE 1 TABLET BY MOUTH EVERY 8 HOURS AS NEEDED FOR NAUSEA AND VOMITING   pantoprazole 40 MG tablet Commonly known as:  PROTONIX TAKE 1  TABLET BY MOUTH TWICE (2) DAILY   potassium chloride SA 20 MEQ tablet Commonly known as:  K-DUR,KLOR-CON TAKE 1 TABLET BY MOUTH ONCE A DAY   predniSONE 5 MG tablet Commonly known as:  DELTASONE Take 5 mg by mouth daily with breakfast.   vitamin C 500 MG tablet Commonly known as:  ASCORBIC ACID Take 500 mg by mouth daily.   zinc gluconate 50 MG tablet Take 50 mg daily by mouth.       Allergies:  Allergies  Allergen Reactions  . Codeine Nausea And Vomiting  . Latex Rash    Family History: Family History  Problem Relation Age of Onset  . Diabetes Mother   . Arthritis Mother   . Diabetes Father   . Arthritis Father   . Aneurysm Sister  neck  . Cancer Sister        lung    Social History:  reports that she quit smoking about 23 years ago. Her smoking use included cigarettes. She has a 30.00 pack-year smoking history. she has never used smokeless tobacco. She reports that she does not drink alcohol or use drugs.  ROS: UROLOGY Frequent Urination?: No Hard to postpone urination?: Yes Burning/pain with urination?: No Get up at night to urinate?: No Leakage of urine?: Yes Urine stream starts and stops?: Yes Trouble starting stream?: No Do you have to strain to urinate?: No Blood in urine?: No Urinary tract infection?: No Sexually transmitted disease?: No Injury to kidneys or bladder?: No Painful intercourse?: No Weak stream?: No Currently pregnant?: No Vaginal bleeding?: No Last menstrual period?: n  Gastrointestinal Nausea?: Yes Vomiting?: No Indigestion/heartburn?: Yes Diarrhea?: Yes Constipation?: No  Constitutional Fever: No Night sweats?: No Weight loss?: No Fatigue?: Yes  Skin Skin rash/lesions?: No Itching?: No  Eyes Blurred vision?: Yes Double vision?: No  Ears/Nose/Throat Sore throat?: No Sinus problems?: Yes  Hematologic/Lymphatic Swollen glands?: No Easy bruising?: Yes  Cardiovascular Leg swelling?: No Chest pain?:  No  Respiratory Cough?: No Shortness of breath?: Yes  Endocrine Excessive thirst?: No  Musculoskeletal Back pain?: No Joint pain?: No  Neurological Headaches?: No Dizziness?: No  Psychologic Depression?: No Anxiety?: No  Physical Exam: BP (!) 95/55   Pulse 86   Ht 5' 3"  (1.6 m)   Wt 131 lb (59.4 kg)   BMI 23.21 kg/m   Constitutional:  Alert and oriented, No acute distress.  Accompanied by husband today.  Pale appearing, somewhat frail. HEENT: [place] AT, moist mucus membranes.  Trachea midline, no masses. Cardiovascular: No clubbing, cyanosis, or edema. Respiratory: Normal respiratory effort, no increased work of breathing. GU: No CVA tenderness.  Neurologic: Grossly intact, no focal deficits, moving all 4 extremities. Psychiatric: Normal mood and affect.  Laboratory Data: Lab Results  Component Value Date   WBC 5.6 07/10/2017   HGB 7.9 (L) 07/10/2017   HCT 23.8 (L) 07/10/2017   MCV 92.4 07/10/2017   PLT 298 07/10/2017    Lab Results  Component Value Date   CREATININE 0.73 07/10/2017   Lab Results  Component Value Date   HGBA1C 6.5 (H) 05/28/2016    Urinalysis N/a  Pertinent Imaging: CLINICAL DATA:  Ovarian carcinoma. Increased abdominal pain. RIGHT upper quadrant pain.  EXAM: CT CHEST, ABDOMEN, AND PELVIS WITH CONTRAST  TECHNIQUE: Multidetector CT imaging of the chest, abdomen and pelvis was performed following the standard protocol during bolus administration of intravenous contrast.  CONTRAST:  114m ISOVUE-300 IOPAMIDOL (ISOVUE-300) INJECTION 61%  COMPARISON:  CT 05/22/2017  FINDINGS: CT CHEST FINDINGS  Cardiovascular: Port in the anterior chest wall with tip in distal SVC. Coronary artery calcification and aortic atherosclerotic calcification.  Mediastinum/Nodes: No axillary adenopathy.  RIGHT lower paratracheal adenopathy measuring up to 16 mm short axis unchanged. No pericardial effusion. Esophagus normal.  Lungs/Pleura:  Pleural-based nodule in the RIGHT lower lobe measures 19 mm x 25 mm compared with 20 mm by 22 mm. Inferior RIGHT lower lobe pleural-based mass measuring 38 mm by 20 mm compares to 42 x 20 mm.  Moderate LEFT effusions unchanged.  No new nodularity.  Musculoskeletal: No aggressive osseous lesion.  CT ABDOMEN AND PELVIS FINDINGS  Hepatobiliary: Widespread hepatic metastasis again noted. Lesions are within the parenchyma along the peritoneal surfaces. Group of lesions in the central LEFT hepatic lobe are increased. Two adjacent lesions measure 16 mm  and combined diameter compared to 43 on prior (image 57, series 2 subcapsular lesion the RIGHT hepatic lobe measures 61 by 37 mm compared with 61 by 33 mm. Lesions central RIGHT hepatic lobe adjacent to the gallbladder clips measures 39 mm diameter compared with 36 mm. No discrete new lesions are evident. The most dramatic progression is within the LEFT hepatic lobe  Pancreas: Pancreas is normal. No ductal dilatation. No pancreatic inflammation.  Spleen: Subcapsular lesion in the posterior aspect of the spleen is more prominent (image 54, series 2) measuring 27 mm in depth compared to 19 mm.  Adrenals/urinary tract: Adrenal glands are normal.  There is interval increase in RIGHT hydronephrosis with the renal pelvis measuring 59 mm compared to 32 mm. Increase in hydroureter on the RIGHT. Again demonstrated hydronephrosis hydroureter on the LEFT collecting system which is similar prior. The LEFT ureter measures 25 mm (image 81, series 2) compared to 25 mm. Bladder grossly normal  Stomach/Bowel: Stomach, small-bowel and cecum normal. There is no small bowel obstruction. LEFT abdominal wall colostomy. No colonic obstruction.  Vascular/Lymphatic: Abdominal aorta is heavily calcified. No retroperitoneal adenopathy. No pelvic adenopathy  Reproductive: Post hysterectomy  Other: Bulky peritoneal metastasis again demonstrated.  Dominant lesion in the LEFT pelvis measures 93 mm by 78 mm compared with 85 x 67 mm for some increased in size. RIGHT pelvic nodal mass peritoneal mass measures 28 mm increased from 23 mm (image 98, series 2). Presumably these pelvic peritoneal masses obstruct the ureters as above.  Multiple peritoneal nodules along the ventral peritoneal space as well as along the ventral peritoneal surface of the abdomen and extending into the subcutaneous tissue adjacent to the ostomy with minimal change.  Musculoskeletal: No aggressive osseous lesion.  IMPRESSION: Chest Impression:  1. RIGHT lower lobe pleural-based nodules measure slightly smaller. 2. Stable bilateral pleural effusions LEFT greater than RIGHT. 3. Stable mediastinal adenopathy.  Abdomen / Pelvis Impression:  1. Evidence of progression of disease in the abdomen pelvis. 2. Interval increase in size of hepatic lesions most noticeably in the LEFT hepatic lobe. 3. Interval increase in bilateral hydronephrosis and hydroureter. 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 4. Interval increase in size of large peritoneal and pelvic peritoneal implants. 5. 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  These results will be called to the ordering clinician or representative by the Radiologist Assistant, and communication documented in the PACS or zVision Dashboard.   Electronically Signed   By: Suzy Bouchard M.D.   On: 07/10/2017 15:21  CT scan was personally reviewed today and with the patient and her husband.  Assessment & Plan:    1. Hydronephrosis of bilateral kidneys Progression to severe hydroureteronephrosis bilaterally, right greater than left Renal function continues to be preserved Her overall prognosis is poor no further chemotherapy is planned In the setting of progressive urinary obstruction, it is extremely likely  that her renal function will begin to decline and lead to her demise We discussed that ureteral stent versus percutaneous nephrostomy tubes could likely prolong her life somewhat She is not interested in percutaneous nephrostomy tubes due to concern for pain and discomfort from the tubes She is interested in pursuing bilateral ureteral stents with the understanding that the stents may not be effective if there is significant extrinsic compression from her tumor The option of hospice with no intervention was also discussed today as well as the natural history of renal failure  She would like to  proceed to the operating room tomorrow for cystoscopy, retrograde pyelogram, and bilateral ureteral stent placement.  Risk and benefits were discussed in detail including risk of bleeding, infection, damage to surrounding structures, irritation from the stent.  She is also aware of the risk of failure of the stents.  We will likely have her follow-up in 1 week with renal ultrasound prior as well as labs to ensure that the stents are functioning well.  All questions answered.  Hollice Espy, MD  [facility] [address], [place], [place] [address] [phone]

## 2017-07-13 ENCOUNTER — Encounter
Admission: RE | Disposition: A | Discharge status: Home / Self Care | Payer: Self-pay | Source: Ambulatory Visit | Attending: Urology

## 2017-07-13 ENCOUNTER — Ambulatory Visit
Admission: RE | Admit: 2017-07-13 | Discharge: 2017-07-13 | Disposition: A | Discharge status: Home / Self Care | Payer: Medicare Other | Source: Ambulatory Visit | Attending: Urology | Admitting: Urology

## 2017-07-13 ENCOUNTER — Ambulatory Visit: Payer: Medicare Other | Admitting: Certified Registered Nurse Anesthetist

## 2017-07-13 ENCOUNTER — Encounter: Payer: Self-pay | Admitting: Anesthesiology

## 2017-07-13 ENCOUNTER — Telehealth: Payer: Self-pay | Admitting: *Deleted

## 2017-07-13 ENCOUNTER — Other Ambulatory Visit: Payer: Self-pay

## 2017-07-13 ENCOUNTER — Telehealth: Payer: Self-pay | Admitting: Urology

## 2017-07-13 DIAGNOSIS — Z885 Allergy status to narcotic agent status: Secondary | ICD-10-CM | POA: Insufficient documentation

## 2017-07-13 DIAGNOSIS — Z794 Long term (current) use of insulin: Secondary | ICD-10-CM | POA: Diagnosis not present

## 2017-07-13 DIAGNOSIS — E119 Type 2 diabetes mellitus without complications: Secondary | ICD-10-CM | POA: Insufficient documentation

## 2017-07-13 DIAGNOSIS — K769 Liver disease, unspecified: Secondary | ICD-10-CM | POA: Diagnosis not present

## 2017-07-13 DIAGNOSIS — M069 Rheumatoid arthritis, unspecified: Secondary | ICD-10-CM | POA: Diagnosis not present

## 2017-07-13 DIAGNOSIS — N1330 Unspecified hydronephrosis: Secondary | ICD-10-CM | POA: Diagnosis present

## 2017-07-13 DIAGNOSIS — I509 Heart failure, unspecified: Secondary | ICD-10-CM | POA: Diagnosis not present

## 2017-07-13 DIAGNOSIS — E785 Hyperlipidemia, unspecified: Secondary | ICD-10-CM | POA: Diagnosis not present

## 2017-07-13 DIAGNOSIS — I252 Old myocardial infarction: Secondary | ICD-10-CM | POA: Diagnosis not present

## 2017-07-13 DIAGNOSIS — I2510 Atherosclerotic heart disease of native coronary artery without angina pectoris: Secondary | ICD-10-CM | POA: Diagnosis not present

## 2017-07-13 DIAGNOSIS — Z79899 Other long term (current) drug therapy: Secondary | ICD-10-CM | POA: Insufficient documentation

## 2017-07-13 DIAGNOSIS — Z933 Colostomy status: Secondary | ICD-10-CM | POA: Diagnosis not present

## 2017-07-13 DIAGNOSIS — F329 Major depressive disorder, single episode, unspecified: Secondary | ICD-10-CM | POA: Insufficient documentation

## 2017-07-13 DIAGNOSIS — Z8673 Personal history of transient ischemic attack (TIA), and cerebral infarction without residual deficits: Secondary | ICD-10-CM | POA: Diagnosis not present

## 2017-07-13 DIAGNOSIS — Z8543 Personal history of malignant neoplasm of ovary: Secondary | ICD-10-CM | POA: Diagnosis not present

## 2017-07-13 DIAGNOSIS — I110 Hypertensive heart disease with heart failure: Secondary | ICD-10-CM | POA: Diagnosis not present

## 2017-07-13 DIAGNOSIS — K219 Gastro-esophageal reflux disease without esophagitis: Secondary | ICD-10-CM | POA: Diagnosis not present

## 2017-07-13 DIAGNOSIS — Z87891 Personal history of nicotine dependence: Secondary | ICD-10-CM | POA: Insufficient documentation

## 2017-07-13 DIAGNOSIS — Z951 Presence of aortocoronary bypass graft: Secondary | ICD-10-CM | POA: Insufficient documentation

## 2017-07-13 HISTORY — PX: CYSTOSCOPY W/ RETROGRADES: SHX1426

## 2017-07-13 HISTORY — PX: CYSTOSCOPY WITH STENT PLACEMENT: SHX5790

## 2017-07-13 LAB — GLUCOSE, CAPILLARY
Glucose-Capillary: 180 mg/dL — ABNORMAL HIGH (ref 65–99)
Glucose-Capillary: 147 mg/dL — ABNORMAL HIGH (ref 65–99)

## 2017-07-13 SURGERY — CYSTOSCOPY, WITH STENT INSERTION
Anesthesia: General | Laterality: Bilateral

## 2017-07-13 MED ORDER — FENTANYL CITRATE (PF) 100 MCG/2ML IJ SOLN
INTRAMUSCULAR | Status: AC
  Filled 2017-07-13: qty 2

## 2017-07-13 MED ORDER — DEXAMETHASONE SODIUM PHOSPHATE 10 MG/ML IJ SOLN
INTRAMUSCULAR | Status: DC | PRN
  Administered 2017-07-13: 5 mg via INTRAVENOUS

## 2017-07-13 MED ORDER — DEXAMETHASONE SODIUM PHOSPHATE 10 MG/ML IJ SOLN
INTRAMUSCULAR | Status: AC
  Filled 2017-07-13: qty 1

## 2017-07-13 MED ORDER — ONDANSETRON HCL 4 MG/2ML IJ SOLN: 4.0000 mg | Freq: Once | INTRAMUSCULAR | Status: DC | PRN

## 2017-07-13 MED ORDER — ONDANSETRON HCL 4 MG/2ML IJ SOLN
INTRAMUSCULAR | Status: AC
  Filled 2017-07-13: qty 2

## 2017-07-13 MED ORDER — OXYBUTYNIN CHLORIDE 5 MG PO TABS: 5.0000 mg | ORAL_TABLET | Freq: Three times a day (TID) | ORAL | 2 refills | Status: DC | PRN

## 2017-07-13 MED ORDER — CEFAZOLIN SODIUM-DEXTROSE 1-4 GM/50ML-% IV SOLN
1.0000 g | INTRAVENOUS | Status: AC
  Administered 2017-07-13: 1 g via INTRAVENOUS

## 2017-07-13 MED ORDER — FENTANYL CITRATE (PF) 100 MCG/2ML IJ SOLN
INTRAMUSCULAR | Status: DC | PRN
  Administered 2017-07-13 (×4): 25 ug via INTRAVENOUS

## 2017-07-13 MED ORDER — LIDOCAINE HCL (PF) 2 % IJ SOLN
INTRAMUSCULAR | Status: AC
  Filled 2017-07-13: qty 10

## 2017-07-13 MED ORDER — PROPOFOL 10 MG/ML IV BOLUS
INTRAVENOUS | Status: DC | PRN
  Administered 2017-07-13: 100 mg via INTRAVENOUS

## 2017-07-13 MED ORDER — PHENYLEPHRINE HCL 10 MG/ML IJ SOLN
INTRAMUSCULAR | Status: DC | PRN
  Administered 2017-07-13: 100 ug via INTRAVENOUS

## 2017-07-13 MED ORDER — PHENYLEPHRINE HCL 10 MG/ML IJ SOLN
INTRAMUSCULAR | Status: AC
  Filled 2017-07-13: qty 1

## 2017-07-13 MED ORDER — ONDANSETRON HCL 4 MG/2ML IJ SOLN
INTRAMUSCULAR | Status: DC | PRN
  Administered 2017-07-13: 4 mg via INTRAVENOUS

## 2017-07-13 MED ORDER — LIDOCAINE HCL (CARDIAC) 20 MG/ML IV SOLN
INTRAVENOUS | Status: DC | PRN
  Administered 2017-07-13: 50 mg via INTRAVENOUS

## 2017-07-13 MED ORDER — IOTHALAMATE MEGLUMINE 43 % IV SOLN
INTRAVENOUS | Status: DC | PRN
  Administered 2017-07-13: 70 mL via URETHRAL

## 2017-07-13 MED ORDER — PROPOFOL 10 MG/ML IV BOLUS
INTRAVENOUS | Status: AC
  Filled 2017-07-13: qty 20

## 2017-07-13 MED ORDER — SODIUM CHLORIDE 0.9 % IV SOLN
INTRAVENOUS | Status: DC
  Administered 2017-07-13: 07:00:00 via INTRAVENOUS

## 2017-07-13 MED ORDER — FENTANYL CITRATE (PF) 100 MCG/2ML IJ SOLN
25.0000 ug | INTRAMUSCULAR | Status: DC | PRN
  Administered 2017-07-13: 25 ug via INTRAVENOUS

## 2017-07-13 SURGICAL SUPPLY — 24 items
BAG DRAIN CYSTO-URO LG1000N (MISCELLANEOUS) ×3 IMPLANT
BRUSH SCRUB EZ  4% CHG (MISCELLANEOUS) ×2
BRUSH SCRUB EZ 4% CHG (MISCELLANEOUS) ×1 IMPLANT
CATH URETL 5X70 OPEN END (CATHETERS) ×3 IMPLANT
CONRAY 43 FOR UROLOGY 50M (MISCELLANEOUS) ×3 IMPLANT
DRAPE UTILITY 15X26 TOWEL STRL (DRAPES) ×3 IMPLANT
GLIDEWIRE STIFF .35X180X3 HYDR (WIRE) ×3 IMPLANT
GLOVE BIO SURGEON STRL SZ 6.5 (GLOVE) ×2 IMPLANT
GLOVE BIO SURGEONS STRL SZ 6.5 (GLOVE) ×1
GOWN STRL REUS W/ TWL LRG LVL3 (GOWN DISPOSABLE) ×2 IMPLANT
GOWN STRL REUS W/TWL LRG LVL3 (GOWN DISPOSABLE) ×4
KIT TURNOVER CYSTO (KITS) ×3 IMPLANT
PACK CYSTO AR (MISCELLANEOUS) ×3 IMPLANT
SENSORWIRE 0.038 NOT ANGLED (WIRE) ×3
SET CYSTO W/LG BORE CLAMP LF (SET/KITS/TRAYS/PACK) ×3 IMPLANT
SOL .9 NS 3000ML IRR  AL (IV SOLUTION) ×2
SOL .9 NS 3000ML IRR UROMATIC (IV SOLUTION) ×1 IMPLANT
STENT URET 6FRX24 CONTOUR (STENTS) IMPLANT
STENT URET 6FRX26 CONTOUR (STENTS) IMPLANT
STENT URO INLAY 6FRX24CM (STENTS) ×6 IMPLANT
SURGILUBE 2OZ TUBE FLIPTOP (MISCELLANEOUS) ×3 IMPLANT
SYRINGE IRR TOOMEY STRL 70CC (SYRINGE) ×3 IMPLANT
WATER STERILE IRR 1000ML POUR (IV SOLUTION) ×3 IMPLANT
WIRE SENSOR 0.038 NOT ANGLED (WIRE) ×1 IMPLANT

## 2017-07-13 NOTE — Interval H&P Note (Signed)
History and Physical Interval Note:  07/13/2017 7:07 AM  [name]  has presented today for surgery, with the diagnosis of bilateral hysronephrosis  The various methods of treatment have been discussed with the patient and family. After consideration of risks, benefits and other options for treatment, the patient has consented to  Procedure(s): CYSTOSCOPY WITH STENT PLACEMENT (Bilateral) CYSTOSCOPY WITH RETROGRADE PYELOGRAM (Bilateral) as a surgical intervention .  The patient's history has been reviewed, patient examined, no change in status, stable for surgery.  I have reviewed the patient's chart and labs.  Questions were answered to the patient's satisfaction.    RRR CTAB  Hollice Espy

## 2017-07-13 NOTE — Anesthesia Post-op Follow-up Note (Signed)
Anesthesia QCDR form completed.        

## 2017-07-13 NOTE — Transfer of Care (Signed)
Immediate Anesthesia Transfer of Care Note  Patient: [name]  Procedure(s) Performed: CYSTOSCOPY WITH STENT PLACEMENT (Bilateral ) CYSTOSCOPY WITH RETROGRADE PYELOGRAM (Bilateral )  Patient Location: PACU  Anesthesia Type:General  Level of Consciousness: awake, alert , oriented and patient cooperative  Airway & Oxygen Therapy: Patient Spontanous Breathing and Patient connected to face mask oxygen  Post-op Assessment: Report given to RN and Post -op Vital signs reviewed and stable  Post vital signs: Reviewed and stable  Last Vitals:  Vitals:   07/13/17 0621  BP: (!) 161/61  Pulse: 92  Resp: (!) 98  Temp: 36.4 C  SpO2: 98%    Last Pain:  Vitals:   07/13/17 0621  TempSrc: Tympanic         Complications: No apparent anesthesia complications

## 2017-07-13 NOTE — Telephone Encounter (Signed)
That will be fine. She has symptom management interventions in place. Her admission is not emergent. The stents are the pressing issue at this point. She will surely let us know if she needs anything. Thanks for letting us know.

## 2017-07-13 NOTE — Telephone Encounter (Signed)
Hospice director is delaying her admission to hospice until Saturday 3/16 secondary to her stent placement.

## 2017-07-13 NOTE — Discharge Instructions (Signed)
You have a ureteral stent in place.  This is a tube that extends from your kidney to your bladder.  This may cause urinary bleeding, burning with urination, and urinary frequency.  Please call our office or present to the ED if you develop fevers >101 or pain which is not able to be controlled with oral pain medications.  You may be given either Flomax and/ or ditropan to help with bladder spasms and stent pain in addition to pain medications.   ° °[facility] °[address], [address] °, [place] [address] °[phone] ° ° ° °AMBULATORY SURGERY  °DISCHARGE INSTRUCTIONS ° ° °1) The drugs that you were given will stay in your system until tomorrow so for the next 24 hours you should not: ° °A) Drive an automobile °B) Make any legal decisions °C) Drink any alcoholic beverage ° ° °2) You may resume regular meals tomorrow.  Today it is better to start with liquids and gradually work up to solid foods. ° °You may eat anything you prefer, but it is better to start with liquids, then soup and crackers, and gradually work up to solid foods. ° ° °3) Please notify your doctor immediately if you have any unusual bleeding, trouble breathing, redness and pain at the surgery site, drainage, fever, or pain not relieved by medication. ° ° ° °4) Additional Instructions: ° ° ° ° ° ° ° °Please contact your physician with any problems or Same Day Surgery at [phone], Monday through Friday 6 am to 4 pm, or [place] [facility] [place] Main number at [phone]. °

## 2017-07-13 NOTE — Anesthesia Procedure Notes (Signed)
Procedure Name: LMA Insertion Date/Time: 07/13/2017 7:23 AM Performed by: Eben Burow, CRNA Pre-anesthesia Checklist: Patient identified, Emergency Drugs available, Suction available, Patient being monitored and Timeout performed Patient Re-evaluated:Patient Re-evaluated prior to induction Oxygen Delivery Method: Circle system utilized Preoxygenation: Pre-oxygenation with 100% oxygen Induction Type: IV induction Ventilation: Mask ventilation without difficulty LMA: LMA inserted LMA Size: 3.0 Number of attempts: 1 Placement Confirmation: positive ETCO2 and breath sounds checked- equal and bilateral Tube secured with: Tape Dental Injury: Teeth and Oropharynx as per pre-operative assessment

## 2017-07-13 NOTE — Anesthesia Preprocedure Evaluation (Signed)
Anesthesia Evaluation  Patient identified by MRN, date of birth, ID band Patient awake    Reviewed: Allergy & Precautions, H&P , NPO status , Patient's Chart, lab work & pertinent test results, reviewed documented beta blocker date and time   History of Anesthesia Complications Negative for: history of anesthetic complications  Airway Mallampati: II  TM Distance: >3 FB Neck ROM: full    Dental  (+) Caps, Dental Advidsory Given   Pulmonary shortness of breath and with exertion, neg sleep apnea, neg COPD, neg recent URI, former smoker,           Cardiovascular Exercise Tolerance: Good hypertension, (-) angina+ CAD, + Past MI, + CABG and +CHF  (-) Cardiac Stents (-) dysrhythmias (-) Valvular Problems/Murmurs     Neuro/Psych neg Seizures PSYCHIATRIC DISORDERS Depression  Neuromuscular disease CVA, No Residual Symptoms    GI/Hepatic Neg liver ROS, hiatal hernia, GERD  ,  Endo/Other  diabetes, Insulin Dependent  Renal/GU Renal disease  negative genitourinary   Musculoskeletal   Abdominal   Peds  Hematology  (+) Blood dyscrasia, anemia ,   Anesthesia Other Findings Past Medical History: No date: Anemia No date: Arthritis No date: CAD (coronary artery disease)     Comment:  a. 09/2012 Cath: LM nl, LAD 95p, 42m LCX 918mOM2 50,               RCA 100. No date: CHF (congestive heart failure) (HCC) No date: Cholelithiasis No date: Collagen vascular disease ([facility]    Comment:  Rheumatoid Arthritis. No date: Depression No date: Environmental and seasonal allergies No date: Esophageal stricture No date: Exertional shortness of breath No date: GERD (gastroesophageal reflux disease) No date: H/O hiatal hernia No date: Herniated disc No date: History of pancytopenia No date: Hyperlipidemia No date: Hypertension No date: Hypokalemia 2012: Leukopenia     Comment:  s/p bone marrow biopsy, Dr. PaMa Hillocko date:  Neuropathy 08/2012: NSTEMI (non-ST elevated myocardial infarction) ([facility]    Comment:  "mild" (10/18/2012) 2016: Ovarian cancer ([facility]CO[facility]    Comment:  chemo and hysterectomy 2013; 08/2012: Pneumonia     Comment:  "one lung; double" (10/18/2012) No date: Rheumatoid arthritis(714.0) No date: [facility] date: Type I diabetes mellitus ([facility]    Comment:  "dx'd in 1957" (10/18/2012) No date: Ureter obstruction   Reproductive/Obstetrics negative OB ROS                             Anesthesia Physical Anesthesia Plan  ASA: III  Anesthesia Plan: General   Post-op Pain Management:    Induction: Intravenous  PONV Risk Score and Plan: 3 and Ondansetron and Dexamethasone  Airway Management Planned: LMA  Additional Equipment:   Intra-op Plan:   Post-operative Plan: Extubation in OR  Informed Consent: I have reviewed the patients History and Physical, chart, labs and discussed the procedure including the risks, benefits and alternatives for the proposed anesthesia with the patient or authorized representative who has indicated his/her understanding and acceptance.   Dental Advisory Given  Plan Discussed with: Anesthesiologist, CRNA and Surgeon  Anesthesia Plan Comments:         Anesthesia Quick Evaluation

## 2017-07-13 NOTE — Telephone Encounter (Signed)
-----   Message from Hollice Espy, MD sent at 07/13/2017  9:18 AM EDT ----- Regarding: RUS in 1 week I would like this patient to get a renal ultrasound in 1 week.  Please order from the PACU.  I do not necessarily need to see her back in clinic for about 3 months.  I can call her with renal ultrasound results.  Hollice Espy, MD

## 2017-07-13 NOTE — Telephone Encounter (Signed)
App changed and patient transferred to scheduling    Sharyn Lull

## 2017-07-13 NOTE — Progress Notes (Signed)
t wave inversion at times upright   No new orders from dr Raphael Gibney

## 2017-07-13 NOTE — Op Note (Signed)
Date of procedure: 07/13/17  Preoperative diagnosis:  1. Severe bilateral hydroneprhosis   Postoperative diagnosis:  1. Severe bilateral hydronephrosis   Procedure: 1. Bilateral retrograde pyelogram 2. Bilateral ureteral stent placement 3. 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  Severe bilateral hydroureteronephrosis with proximal ureteral tortuosity.  Successful bilateral ureteral stent placement with subsequent drainage of contrast material.  EBL: minimal  Specimens: none  Drains: 6 x 24 Fr JJ ureteral stents, Bard optima bilaterally  Indication: [name] is a 72 y.o. 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  After reviewing the management options for treatment, she elected to proceed with the above surgical procedure(s). We have discussed the potential benefits and risks of the procedure, side effects of the proposed treatment, the likelihood of the patient achieving the goals of the procedure, and any potential problems that might occur during the procedure or recuperation. Informed consent has been obtained.  Description of procedure:  The patient was taken to the operating room and general anesthesia was induced.  The patient was placed in the dorsal lithotomy position, prepped and draped in the usual sterile fashion, and preoperative antibiotics were administered. A preoperative time-out was performed.   A 21 French cystoscope was advanced per urethra into the bladder.  The bladder was somewhat distorted with a large widemouth diverticulum noted extending to the left of the bladder.  There were no tumors or lesions within the bladder.  Attention was turned to the right ureteral orifice which was cannulated using a 5 [place] open-ended ureteral catheter.  I then injected Conray  which revealed an extremely capacious, hydronephrotic ureter with proximal tortuosity.  Is able to advance a sensor wire up to level the mid ureter to advance the open-ended ureteral catheter.  This point in time, the renal pelvis did not fill suitably due to tortuosity and the large capacious nature of the ureter.  Advancing the open-ended allowed the ureter to straighten somewhat.  Was unable to advance a sensor wire through this tortuous area but eventually was successful using an angled Glidewire.  Finally, a 6 x 24 French double-J Bard Optima ureteral stent was advanced over the wire to level the renal pelvis.  The wire was partially drawn until focal is noted within the renal pelvis.  The wire was then fully withdrawn and a full coils noted within the bladder.  Fairly prompt drainage of contrast material was noted both fluoroscopically and effluxing through the distal coil of the stent.  Attention was turned to the left ureteral orifice.  This is located just at the edge of a large widemouth diverticulum making accessing the ureter somewhat challenging.  It was eventually able to cannulate the UO using an angled Glidewire and open-ended ureteral catheter.  Using a similar technique as aforementioned, eventually was able to get a angled Glidewire up to level the renal pelvis.  This ureter was also extremely tortuous and capacious.  The diameter of this ureter was on par with a diameter of small bowel.  Eventually, I was successful in advancing a 6 x 24 French Bard Optima ureteral stent over this wire into the renal pelvis.  The wires partially drawn until focal is noted right at the level of the UPJ.  The wire was fully withdrawn and a focal stone within the bladder.  The distal coil was repositioned in such a way to  make it more comfortable for the patient.  There is good excretion of contrast material noted from the distal coil of the stent as well as clearance of the contrast material on fluoroscopy.  The  bladder was then drained and the scope was removed.  The patient was then clean and dry, repositioned in supine position, and taken to the PACU in stable condition.  Plan: I will prescribe medications for bladder spasms in addition to her baseline pain medications which she Artie has.  We will have her follow-up with renal ultrasound in 1 week and will call her with these results.  I will see her back in 3 months to discuss stent exchange depending on her overall clinical status.  All questions answered.  Hollice Espy, M.D.

## 2017-07-14 ENCOUNTER — Inpatient Hospital Stay: Payer: Medicare Other

## 2017-07-14 ENCOUNTER — Encounter: Payer: Self-pay | Admitting: Urology

## 2017-07-14 VITALS — BP 133/70 | HR 82 | Temp 97.9°F | Resp 18

## 2017-07-14 DIAGNOSIS — C800 Disseminated malignant neoplasm, unspecified: Principal | ICD-10-CM

## 2017-07-14 DIAGNOSIS — C561 Malignant neoplasm of right ovary: Secondary | ICD-10-CM

## 2017-07-14 DIAGNOSIS — C569 Malignant neoplasm of unspecified ovary: Secondary | ICD-10-CM

## 2017-07-14 LAB — CBC
HEMATOCRIT: 26.9 % — AB (ref 35.0–47.0)
Hemoglobin: 8.9 g/dL — ABNORMAL LOW (ref 12.0–16.0)
MCH: 29.8 pg (ref 26.0–34.0)
MCHC: 33 g/dL (ref 32.0–36.0)
MCV: 90.4 fL (ref 80.0–100.0)
Platelets: 324 10*3/uL (ref 150–440)
RBC: 2.98 MIL/uL — ABNORMAL LOW (ref 3.80–5.20)
RDW: 20.3 % — AB (ref 11.5–14.5)
WBC: 5.5 10*3/uL (ref 3.6–11.0)

## 2017-07-14 LAB — BASIC METABOLIC PANEL
Anion gap: 8 (ref 5–15)
BUN: 19 mg/dL (ref 6–20)
CO2: 25 mmol/L (ref 22–32)
Calcium: 9 mg/dL (ref 8.9–10.3)
Chloride: 100 mmol/L — ABNORMAL LOW (ref 101–111)
Creatinine, Ser: 0.78 mg/dL (ref 0.44–1.00)
GFR calc Af Amer: >60 mL/min (ref >60)
GFR calc non Af Amer: >60 mL/min (ref >60)
Glucose, Bld: 170 mg/dL — ABNORMAL HIGH (ref 65–99)
Potassium: 3.8 mmol/L (ref 3.5–5.1)
Sodium: 133 mmol/L — ABNORMAL LOW (ref 135–145)

## 2017-07-14 LAB — MAGNESIUM: Magnesium: 1.3 mg/dL — ABNORMAL LOW (ref 1.7–2.4)

## 2017-07-14 MED ORDER — HEPARIN SOD (PORK) LOCK FLUSH 100 UNIT/ML IV SOLN
500.0000 [IU] | Freq: Once | INTRAVENOUS | Status: AC
  Administered 2017-07-14: 500 [IU] via INTRAVENOUS
  Filled 2017-07-14: qty 5

## 2017-07-14 MED ORDER — SODIUM CHLORIDE 0.9 % IV SOLN
6.0000 g | Freq: Once | INTRAVENOUS | Status: AC
  Administered 2017-07-14: 6 g via INTRAVENOUS
  Filled 2017-07-14: qty 10

## 2017-07-14 MED ORDER — SODIUM CHLORIDE 0.9 % IV SOLN
Freq: Once | INTRAVENOUS | Status: AC
  Administered 2017-07-14: 11:00:00 via INTRAVENOUS
  Filled 2017-07-14: qty 1000

## 2017-07-14 MED ORDER — SODIUM CHLORIDE 0.9% FLUSH
10.0000 mL | INTRAVENOUS | Status: DC | PRN
  Administered 2017-07-14: 10 mL via INTRAVENOUS
  Filled 2017-07-14: qty 10

## 2017-07-15 DIAGNOSIS — N1330 Unspecified hydronephrosis: Secondary | ICD-10-CM | POA: Diagnosis not present

## 2017-07-15 DIAGNOSIS — I2510 Atherosclerotic heart disease of native coronary artery without angina pectoris: Secondary | ICD-10-CM | POA: Diagnosis not present

## 2017-07-15 DIAGNOSIS — R310 Gross hematuria: Secondary | ICD-10-CM | POA: Diagnosis not present

## 2017-07-15 DIAGNOSIS — R0602 Shortness of breath: Secondary | ICD-10-CM | POA: Diagnosis not present

## 2017-07-15 DIAGNOSIS — C561 Malignant neoplasm of right ovary: Secondary | ICD-10-CM | POA: Diagnosis present

## 2017-07-15 DIAGNOSIS — J90 Pleural effusion, not elsewhere classified: Secondary | ICD-10-CM | POA: Diagnosis not present

## 2017-07-15 DIAGNOSIS — T451X5S Adverse effect of antineoplastic and immunosuppressive drugs, sequela: Secondary | ICD-10-CM | POA: Diagnosis not present

## 2017-07-15 DIAGNOSIS — E785 Hyperlipidemia, unspecified: Secondary | ICD-10-CM | POA: Diagnosis not present

## 2017-07-15 DIAGNOSIS — M069 Rheumatoid arthritis, unspecified: Secondary | ICD-10-CM | POA: Diagnosis not present

## 2017-07-15 DIAGNOSIS — C786 Secondary malignant neoplasm of retroperitoneum and peritoneum: Secondary | ICD-10-CM | POA: Diagnosis not present

## 2017-07-15 DIAGNOSIS — I252 Old myocardial infarction: Secondary | ICD-10-CM | POA: Diagnosis not present

## 2017-07-15 DIAGNOSIS — R231 Pallor: Secondary | ICD-10-CM | POA: Diagnosis not present

## 2017-07-15 DIAGNOSIS — E109 Type 1 diabetes mellitus without complications: Secondary | ICD-10-CM | POA: Diagnosis not present

## 2017-07-15 DIAGNOSIS — K219 Gastro-esophageal reflux disease without esophagitis: Secondary | ICD-10-CM | POA: Diagnosis not present

## 2017-07-15 DIAGNOSIS — M7989 Other specified soft tissue disorders: Secondary | ICD-10-CM | POA: Diagnosis not present

## 2017-07-15 DIAGNOSIS — I10 Essential (primary) hypertension: Secondary | ICD-10-CM | POA: Diagnosis not present

## 2017-07-15 DIAGNOSIS — K222 Esophageal obstruction: Secondary | ICD-10-CM | POA: Diagnosis not present

## 2017-07-15 DIAGNOSIS — D6481 Anemia due to antineoplastic chemotherapy: Secondary | ICD-10-CM | POA: Diagnosis not present

## 2017-07-15 DIAGNOSIS — Z5111 Encounter for antineoplastic chemotherapy: Secondary | ICD-10-CM | POA: Diagnosis not present

## 2017-07-15 DIAGNOSIS — R6881 Early satiety: Secondary | ICD-10-CM | POA: Diagnosis not present

## 2017-07-15 DIAGNOSIS — I509 Heart failure, unspecified: Secondary | ICD-10-CM | POA: Diagnosis not present

## 2017-07-15 DIAGNOSIS — R1012 Left upper quadrant pain: Secondary | ICD-10-CM | POA: Diagnosis not present

## 2017-07-15 DIAGNOSIS — R110 Nausea: Secondary | ICD-10-CM | POA: Diagnosis not present

## 2017-07-16 NOTE — Anesthesia Postprocedure Evaluation (Signed)
Anesthesia Post Note  Patient: [name]  Procedure(s) Performed: CYSTOSCOPY WITH STENT PLACEMENT (Bilateral ) CYSTOSCOPY WITH RETROGRADE PYELOGRAM (Bilateral )  Patient location during evaluation: PACU Anesthesia Type: General Level of consciousness: awake and alert Pain management: pain level controlled Vital Signs Assessment: post-procedure vital signs reviewed and stable Respiratory status: spontaneous breathing, nonlabored ventilation, respiratory function stable and patient connected to nasal cannula oxygen Cardiovascular status: blood pressure returned to baseline and stable Postop Assessment: no apparent nausea or vomiting Anesthetic complications: no     Last Vitals:  Vitals:   07/13/17 0907 07/13/17 0933  BP: (!) 179/64 (!) 170/61  Pulse: 83 85  Resp: 16 16  Temp: (!) 36.1 C   SpO2: 95% 95%    Last Pain:  Vitals:   07/13/17 0907  TempSrc: Temporal  PainSc:                  Martha Clan

## 2017-07-17 ENCOUNTER — Inpatient Hospital Stay: Payer: Medicare Other

## 2017-07-17 ENCOUNTER — Inpatient Hospital Stay (HOSPITAL_BASED_OUTPATIENT_CLINIC_OR_DEPARTMENT_OTHER): Payer: Medicare Other | Admitting: Hematology and Oncology

## 2017-07-17 ENCOUNTER — Encounter: Payer: Self-pay | Admitting: Hematology and Oncology

## 2017-07-17 ENCOUNTER — Ambulatory Visit: Payer: Medicare Other | Admitting: Hematology and Oncology

## 2017-07-17 VITALS — BP 114/57 | HR 92 | Temp 97.7°F | Resp 18 | Wt 128.0 lb

## 2017-07-17 DIAGNOSIS — K222 Esophageal obstruction: Secondary | ICD-10-CM

## 2017-07-17 DIAGNOSIS — R1012 Left upper quadrant pain: Secondary | ICD-10-CM

## 2017-07-17 DIAGNOSIS — I2510 Atherosclerotic heart disease of native coronary artery without angina pectoris: Secondary | ICD-10-CM

## 2017-07-17 DIAGNOSIS — N1330 Unspecified hydronephrosis: Secondary | ICD-10-CM

## 2017-07-17 DIAGNOSIS — C786 Secondary malignant neoplasm of retroperitoneum and peritoneum: Secondary | ICD-10-CM

## 2017-07-17 DIAGNOSIS — Z9641 Presence of insulin pump (external) (internal): Secondary | ICD-10-CM

## 2017-07-17 DIAGNOSIS — C561 Malignant neoplasm of right ovary: Secondary | ICD-10-CM

## 2017-07-17 DIAGNOSIS — Z90722 Acquired absence of ovaries, bilateral: Secondary | ICD-10-CM

## 2017-07-17 DIAGNOSIS — E109 Type 1 diabetes mellitus without complications: Secondary | ICD-10-CM

## 2017-07-17 DIAGNOSIS — C800 Disseminated malignant neoplasm, unspecified: Principal | ICD-10-CM

## 2017-07-17 DIAGNOSIS — Z7189 Other specified counseling: Secondary | ICD-10-CM

## 2017-07-17 DIAGNOSIS — C801 Malignant (primary) neoplasm, unspecified: Secondary | ICD-10-CM

## 2017-07-17 DIAGNOSIS — R6881 Early satiety: Secondary | ICD-10-CM

## 2017-07-17 DIAGNOSIS — D6481 Anemia due to antineoplastic chemotherapy: Secondary | ICD-10-CM

## 2017-07-17 DIAGNOSIS — Z90710 Acquired absence of both cervix and uterus: Secondary | ICD-10-CM

## 2017-07-17 DIAGNOSIS — I252 Old myocardial infarction: Secondary | ICD-10-CM

## 2017-07-17 DIAGNOSIS — R310 Gross hematuria: Secondary | ICD-10-CM

## 2017-07-17 DIAGNOSIS — Z5111 Encounter for antineoplastic chemotherapy: Secondary | ICD-10-CM

## 2017-07-17 DIAGNOSIS — R231 Pallor: Secondary | ICD-10-CM

## 2017-07-17 DIAGNOSIS — M069 Rheumatoid arthritis, unspecified: Secondary | ICD-10-CM

## 2017-07-17 DIAGNOSIS — Z9221 Personal history of antineoplastic chemotherapy: Secondary | ICD-10-CM

## 2017-07-17 DIAGNOSIS — T451X5S Adverse effect of antineoplastic and immunosuppressive drugs, sequela: Secondary | ICD-10-CM

## 2017-07-17 DIAGNOSIS — Z86718 Personal history of other venous thrombosis and embolism: Secondary | ICD-10-CM

## 2017-07-17 DIAGNOSIS — I509 Heart failure, unspecified: Secondary | ICD-10-CM

## 2017-07-17 DIAGNOSIS — E785 Hyperlipidemia, unspecified: Secondary | ICD-10-CM

## 2017-07-17 DIAGNOSIS — R110 Nausea: Secondary | ICD-10-CM

## 2017-07-17 DIAGNOSIS — T451X5A Adverse effect of antineoplastic and immunosuppressive drugs, initial encounter: Secondary | ICD-10-CM

## 2017-07-17 DIAGNOSIS — Z87891 Personal history of nicotine dependence: Secondary | ICD-10-CM

## 2017-07-17 DIAGNOSIS — I82C11 Acute embolism and thrombosis of right internal jugular vein: Secondary | ICD-10-CM

## 2017-07-17 DIAGNOSIS — Z8673 Personal history of transient ischemic attack (TIA), and cerebral infarction without residual deficits: Secondary | ICD-10-CM

## 2017-07-17 DIAGNOSIS — M7989 Other specified soft tissue disorders: Secondary | ICD-10-CM

## 2017-07-17 DIAGNOSIS — I10 Essential (primary) hypertension: Secondary | ICD-10-CM

## 2017-07-17 DIAGNOSIS — K219 Gastro-esophageal reflux disease without esophagitis: Secondary | ICD-10-CM

## 2017-07-17 DIAGNOSIS — J90 Pleural effusion, not elsewhere classified: Secondary | ICD-10-CM

## 2017-07-17 DIAGNOSIS — G620 Drug-induced polyneuropathy: Secondary | ICD-10-CM

## 2017-07-17 DIAGNOSIS — Z794 Long term (current) use of insulin: Secondary | ICD-10-CM

## 2017-07-17 DIAGNOSIS — R0602 Shortness of breath: Secondary | ICD-10-CM

## 2017-07-17 LAB — CBC WITH DIFFERENTIAL/PLATELET
Basophils Absolute: 0.1 10*3/uL (ref 0–0.1)
Basophils Relative: 1 %
Eosinophils Absolute: 0 10*3/uL (ref 0–0.7)
Eosinophils Relative: 1 %
HCT: 26.6 % — ABNORMAL LOW (ref 35.0–47.0)
Hemoglobin: 8.9 g/dL — ABNORMAL LOW (ref 12.0–16.0)
Lymphocytes Relative: 14 %
Lymphs Abs: 0.5 10*3/uL — ABNORMAL LOW (ref 1.0–3.6)
MCH: 29.8 pg (ref 26.0–34.0)
MCHC: 33.3 g/dL (ref 32.0–36.0)
MCV: 89.7 fL (ref 80.0–100.0)
Monocytes Absolute: 0.4 10*3/uL (ref 0.2–0.9)
Monocytes Relative: 9 %
Neutro Abs: 2.9 10*3/uL (ref 1.4–6.5)
Neutrophils Relative %: 75 %
Platelets: 283 10*3/uL (ref 150–440)
RBC: 2.97 MIL/uL — ABNORMAL LOW (ref 3.80–5.20)
RDW: 19.8 % — ABNORMAL HIGH (ref 11.5–14.5)
WBC: 3.9 10*3/uL (ref 3.6–11.0)

## 2017-07-17 LAB — BASIC METABOLIC PANEL
Anion gap: 10 (ref 5–15)
BUN: 13 mg/dL (ref 6–20)
CO2: 26 mmol/L (ref 22–32)
Calcium: 9 mg/dL (ref 8.9–10.3)
Chloride: 100 mmol/L — ABNORMAL LOW (ref 101–111)
Creatinine, Ser: 0.67 mg/dL (ref 0.44–1.00)
GFR calc Af Amer: >60 mL/min (ref >60)
GFR calc non Af Amer: >60 mL/min (ref >60)
Glucose, Bld: 150 mg/dL — ABNORMAL HIGH (ref 65–99)
Potassium: 4 mmol/L (ref 3.5–5.1)
Sodium: 136 mmol/L (ref 135–145)

## 2017-07-17 LAB — MAGNESIUM: Magnesium: 1.5 mg/dL — ABNORMAL LOW (ref 1.7–2.4)

## 2017-07-17 MED ORDER — TBO-FILGRASTIM 300 MCG/0.5ML ~~LOC~~ SOSY: 300.0000 ug | PREFILLED_SYRINGE | Freq: Once | SUBCUTANEOUS | Status: DC

## 2017-07-17 MED ORDER — SODIUM CHLORIDE 0.9 % IV SOLN
6.0000 g | Freq: Once | INTRAVENOUS | Status: AC
  Administered 2017-07-17: 6 g via INTRAVENOUS
  Filled 2017-07-17: qty 10

## 2017-07-17 MED ORDER — SODIUM CHLORIDE 0.9 % IV SOLN
INTRAVENOUS | Status: DC
  Administered 2017-07-17: 11:00:00 via INTRAVENOUS
  Filled 2017-07-17: qty 1000

## 2017-07-17 MED ORDER — HEPARIN SOD (PORK) LOCK FLUSH 100 UNIT/ML IV SOLN
250.0000 [IU] | Freq: Once | INTRAVENOUS | Status: AC | PRN
  Administered 2017-07-17: 500 [IU]

## 2017-07-17 MED ORDER — HEPARIN SOD (PORK) LOCK FLUSH 100 UNIT/ML IV SOLN
INTRAVENOUS | Status: AC
  Filled 2017-07-17: qty 5

## 2017-07-17 NOTE — Progress Notes (Signed)
CYSTOSCOPY WITH STENT PLACEMENT/producer done Thursday last week urine thick with burning / partal flake noted in urine. Blood noted to be bright red with urinating. No fever noted, no nausea or vomiting

## 2017-07-17 NOTE — Progress Notes (Signed)
Mg 1.5 today. Per Honor Loh, NP give 6mg  Mg today

## 2017-07-17 NOTE — Progress Notes (Signed)
[facility] day:  07/17/2017   Chief Complaint: [name] is a 72 y.o. female with stage IV ovarian cancer who is seen for 1 week assessment.  HPI:  The patient was last seen in the medical oncology clinic on 07/11/2017.  At that time, scans revealed progressive disease.  She had increasing hydropnephrosis.  We discussed supportive care/Hospice.  She was referred to Dr. Erlene Quan for consideration of ureteral stents.  She underwent bilateral ureteral stent placement on 07/13/2017 by Dr. Erlene Quan.  Patient admitted to Hospice services on 07/15/2017.  During the interim, patient is doing "ok". Patient experiencing gross hematuria following her stent placement. Patient's husband describes that patient's urine that looked like "mother in vinegar" and had "particles" in it. Patient denies any dysuria.   Patient notes an "ok"  appetite. She states, "I am not eating or drinking as much".  Patient's weight is down 3 pounds. Patient has experienced no B symptoms or interval infections.  Patient's stools have became more loose since last week.   Patient denies pain in the clinic today.    Past Medical History:  Diagnosis Date  . Anemia   . Arthritis   . CAD (coronary artery disease)    a. 09/2012 Cath: LM nl, LAD 95p, 82m LCX 954mOM2 50, RCA 100.  . Marland KitchenHF (congestive heart failure) ([place]  . Cholelithiasis   . Collagen vascular disease (HCC)    Rheumatoid Arthritis.  . Depression   . Environmental and seasonal allergies   . Esophageal stricture   . Exertional shortness of breath   . GERD (gastroesophageal reflux disease)   . H/O hiatal hernia   . Herniated disc   . History of pancytopenia   . Hyperlipidemia   . Hypertension   . Hypokalemia   . Leukopenia 2012   s/p bone marrow biopsy, Dr. PaMa Hillock. Neuropathy   . NSTEMI (non-ST elevated myocardial infarction) ([place]CM[place]5/2014   "mild" (10/18/2012)  . Ovarian cancer ([facility]2016   chemo and  hysterectomy  . Pneumonia 2013; 08/2012   "one lung; double" (10/18/2012)  . Rheumatoid arthritis(714.0)   . Stroke ([place]  . Type I diabetes mellitus ([facility] [place]   "dx'd in 1957" (10/18/2012)  . Ureter obstruction     Past Surgical History:  Procedure Laterality Date  . ABDOMINAL HYSTERECTOMY  06/15/2015   Dx L/S, EXLAP TAH BSO omentectomy RSRx colostomy diaphragm resection stripping  . CARDIAC CATHETERIZATION  10/18/2012   "first one was today" (10/18/2012)  . CATARACT EXTRACTION W/ INTRAOCULAR LENS IMPLANT Right 2010  . CHOLECYSTECTOMY  06/15/2015   combined case with ovarian cancer debulking  . COLON SURGERY    . CORONARY ARTERY BYPASS GRAFT N/A 10/19/2012   Procedure: CORONARY ARTERY BYPASS GRAFTING (CABG);  Surgeon: StMelrose NakayamaMD;  [place] [facility] Service: Open Heart Surgery;  Laterality: N/A;  . CYSTOSCOPY W/ RETROGRADES Bilateral 07/13/2017   Procedure: CYSTOSCOPY WITH RETROGRADE PYELOGRAM;  Surgeon: BrHollice EspyMD;  Location: [facility] ORS;  Service: Urology;  Laterality: Bilateral;  . CYSTOSCOPY WITH STENT PLACEMENT Bilateral 07/13/2017   Procedure: CYSTOSCOPY WITH STENT PLACEMENT;  Surgeon: BrHollice EspyMD;  Location: [facility] ORS;  Service: Urology;  Laterality: Bilateral;  . ESOPHAGEAL DILATION     "3 or 4 times" (10/19/2011)  . ESOPHAGOGASTRODUODENOSCOPY  2012   Dr. IfMinna Merritts. OSTOMY    . OVARY SURGERY     removal  . PERIPHERAL VASCULAR CATHETERIZATION N/A  03/02/2015   Procedure: Glori Luis Cath Insertion;  Surgeon: Algernon Huxley, MD;  Location: [place] [facility];  Service: Cardiovascular;  Laterality: N/A;  . TUBAL LIGATION  1970    Family History  Problem Relation Age of Onset  . Diabetes Mother   . Arthritis Mother   . Diabetes Father   . Arthritis Father   . Aneurysm Sister        neck  . Cancer Sister        lung    Social History:  reports that she quit smoking about 23 years ago. Her smoking use included cigarettes. She has a 30.00 pack-year smoking  history. she has never used smokeless tobacco. She reports that she does not drink alcohol or use drugs.  Patient is a retired Art therapist. Patient denies exposure to radiation and toxins. She spends every Thursday afternoon with her sister.  Her son is [name] and daughter is [name].  She is accompanied by her husband today.  Allergies:  Allergies  Allergen Reactions  . Codeine Nausea And Vomiting  . Latex Rash    Current Medications: Current Outpatient Medications  Medication Sig Dispense Refill  . Calcium-Vitamin D 600-200 MG-UNIT tablet Take 1 tablet by mouth 2 (two) times daily.     . cholecalciferol (VITAMIN D) 1000 units tablet Take 1,000 Units by mouth daily.    . DULoxetine (CYMBALTA) 20 MG capsule Take 1 capsule (20 mg total) by mouth daily. 90 capsule 2  . enoxaparin (LOVENOX) 60 MG/0.6ML injection INJECT 0.6ML (60MG TOTAL) UNDER THE SKIN EVERY 12 HOURS 36 mL 1  . folic acid (FOLVITE) 1 MG tablet Take 1 tablet (1 mg total) by mouth daily. 90 tablet 3  . furosemide (LASIX) 40 MG tablet Take 1 tablet (40 mg total) by mouth daily as needed for edema. 30 tablet 1  . insulin lispro (HUMALOG) 100 UNIT/ML injection Inject 0.06 mLs (6 Units total) into the skin daily. (Patient taking differently: Inject 0-85 Units into the skin daily. Via insulin pump) 30 mL 3  . lidocaine-prilocaine (EMLA) cream APPLY 1 APPLICATION TOPICALLY AS NEEDED 1 HOUR PRIOR TO TREATMENT. COVER WITH PRESS N SEAL UNTIL TREATMENT TIME AS DIRECTED 30 g 1  . loratadine (CLARITIN) 10 MG tablet Take 1 tablet by mouth daily as needed.    . magnesium oxide (MAG-OX) 400 (241.3 Mg) MG tablet TAKE 1 TABLET BY MOUTH ONCE DAILY 30 tablet 1  . metoprolol tartrate (LOPRESSOR) 25 MG tablet Take 0.5 tablets (12.5 mg total) by mouth daily after breakfast. 45 tablet 3  . ondansetron (ZOFRAN) 8 MG tablet TAKE 1 TABLET BY MOUTH EVERY 8 HOURS AS NEEDED FOR NAUSEA AND VOMITING 30 tablet 1  . oxybutynin (DITROPAN) 5 MG tablet Take 1  tablet (5 mg total) by mouth every 8 (eight) hours as needed for bladder spasms. 30 tablet 2  . pantoprazole (PROTONIX) 40 MG tablet TAKE 1 TABLET BY MOUTH TWICE (2) DAILY 180 tablet 1  . potassium chloride SA (K-DUR,KLOR-CON) 20 MEQ tablet TAKE 1 TABLET BY MOUTH ONCE A DAY 90 tablet 1  . predniSONE (DELTASONE) 5 MG tablet Take 5 mg by mouth daily with breakfast.    . vitamin C (ASCORBIC ACID) 500 MG tablet Take 500 mg by mouth daily.    Marland Kitchen zinc gluconate 50 MG tablet Take 50 mg daily by mouth.     No current facility-administered medications for this visit.    Facility-Administered Medications Ordered in Other Visits  Medication Dose Route  Frequency Provider Last Rate Last Dose  . 0.9 %  sodium chloride infusion   Intravenous Continuous Lequita Asal, MD   Stopped at 04/24/17 1140  . magnesium sulfate IVPB 4 g 100 mL  4 g Intravenous Once Faythe Casa E, NP      . sodium chloride 0.9 % injection 10 mL  10 mL Intracatheter PRN Corcoran, Melissa C, MD      . sodium chloride 0.9 % injection 10 mL  10 mL Intravenous PRN Lequita Asal, MD   10 mL at 04/13/15 2878    Review of Systems:  GENERAL:  Feels "ok".  Fatigue.  No fever, chills or sweats.  Weight down 3 pounds.  PERFORMANCE STATUS (ECOG):  1-2. HEENT:  No visual changes, sore throat, mouth sores or tenderness. Lungs:  Shortness of breath on exertion.  No cough.  No hemoptysis. Cardiac:  No chest pain, palpitations, orthopnea, or PND. GI:  Burning sensation in abdomen.  Nausea in AM, well managed.  No vomiting, diarrhea, melena or hematochezia.  GU:  Gross hematuria s/p ureteral stenting. No urgency, frequency, dysuria, or hematuria. Musculoskeletal:  Back pain.  Severe rheumatoid arthritis (on prednisone).  Osteoporosis.  No back pain. No muscle tenderness. Extremities:  No pain or swelling. Skin:  No increased bruising or bleeding.  No rashes or skin changes. Neuro:  Neuropathy in hands and feet (no change).  No  headache, numbness or weakness, balance or coordination issues. Endocrine:  Diabetes on an insulin pump.  Blood sugar elevated on steroids.  Occasional hot flashes.  Psych:  No mood changes, depression or anxiety.  Sleeping more. Pain:  No focal pain.  Review of systems:  All other systems reviewed and found to be negative.  Physical Exam:  Blood pressure (!) 114/57, pulse 92, temperature 97.7 F (36.5 C), temperature source Tympanic, resp. rate 18, weight 128 lb (58.1 kg), SpO2 97 %. GENERAL:  Well developed, well nourished woman sitting comfortably in the exam room in no acute distress. MENTAL STATUS:  Alert and oriented to person, place and time. HEAD:  Wearing a light silver blue cap.  Alopecia.  Normocephalic, atraumatic, face symmetric, no Cushingoid features. EYES:  Gold rimmed glasses.  Blue eyes.  No conjunctivitis or scleral icterus. Pupils equal round and reactive to light and accomodation. ENT: Oropharynx clear without lesion. Tongue normal. Mucous membranes moist.  RESPIRATORY: Decreased breath sounds right base.  No rales, wheezes or rhonchi. CARDIOVASCULAR: Regular rate and rhythm without murmur, rub or gallop. ABDOMEN: Soft, non-tender with active bowel sounds and no appreciable hepatosplenomegaly. Epigastric fullness.  2.5 cm mass superiorly under rim of ostomy appliance.  Palpable scattered peritoneal nodularity.  Insulin pump.  Ostomy bag. SKIN: Pale.  Abdominal wall scattered ecchymosis and small hematomas secondary to Lovenox.  No rashes or ulcers. EXTREMITIES:  No edema, skin discoloration or tenderness. No palpable cords. LYMPH NODES: No palpable cervical, supraclavicular, axillary or inguinal adenopathy  NEUROLOGICAL: Appropriate. PSYCH: Appropriate.   Radiology studies: 02/13/2015:  Abdomen and pelvic CT revealed bilateral mass-like adnexal regions (right adnexal mass 5.6 x 5.0 cm and the left adnexa mass 10.3 x 6.0 x 9.9 cm).  There was a large amount of  soft tissue throughout the peritoneal cavity involving the omentum and other peritoneal surfaces.  There was a small volume ascites. There was a peripheral 3.3 x 1.9 cm low-attenuation lesion overlying the right lobe of the liver, likely representing a serosal implant. There was 1.4 x 2.2 cm ill-defined peripheral lesion  within the inferior aspect of segment 6 adjacent to the ampulla in the duodenum.  04/28/2015:  Abdominal and pelvic CT revealed decreasing bilateral ovarian masses.   The left adnexal mass measured 4.0 x 6.1 cm (previously 5.0 x 8.5 cm). The right ovary measured 3.8 x 4.9 cm (previously 4.7 x 5.6 cm).  There was improved peritoneal carcinomatosis.  There was a small amount of ascites.  The hepatic dome lesion was stable. The previously seen right hepatic lobe lesion was not well-visualized.  There was a small left pleural effusion and trace right pleural effusion.  The nodular lesion at the ampulla of Vater, extending into the duodenum was stable.  There was no biliary ductal dilatation. 08/01/2015:  Rght upper extremity ultrasound revealed a near occlusive thrombus within the central portion of the right internal jugular vein and central portion of the right subclavian vein. 09/01/2015:  Chest, abdomen, and pelvic CT revealed continued decrease in perihepatic fluid collection contiguous with the right pleural space with percutaneous drain.  09/11/2015:  Chest, abdomen, and pelvic CT revealed a residual versus recurrent fluid collection posterior to the right hepatic lobe (2.6 x 1.1 x 4.0 cm).   10/13/2015:  Chest, abdomen, and pelvic CT revealed resolution of the empyema. 02/15/2016:  Chest, abdomen, and pelvic CT revealed multiple soft tissue nodules throughout the pelvis, small bowel mesenteric and peritoneum and, concerning for peritoneal metastatic disease.  There was soft tissue irregularity within the left upper quadrant.  There was mild right hydronephrosis (etiology unclear).  There  was interval resolution of previously described right pleural based fluid and gas collection. There was a pleural based nodule within the right lower hemi-thorax concerning for pleural based metastasis.  There were small bilateral pleural effusions.  There was pulmonary nodularity, predominately within the left upper lobe (metastatic or infectious/inflammatory etiology).  There was slightly increased mediastinal adenopathy (infectious/inflammatory or metastatic).  There was a low attenuation lesion within the left hepatic lobe (complicated fluid within the fissure or metastatic disease). 03/08/2016:  Abdomen and pelvic CT revealed mild progression of peritoneal disease in the abdomen.  There was interval increase in loculated fluid around the lateral segment left liver, stomach, and spleen.  There was persistent soft tissue lesion at the level of the ampulla with mild intra and extrahepatic biliary duct dilatation.  There was persistent intrahepatic and capsular metastatic disease involving the liver. 05/26/2016:  Head MRI revealed multiple small areas of acute infarct involving the occipital parietal lobe bilaterally and the left lateral cerebellum,  consistent with posterior circulation emboli.  06/10/2016:  Adomen and pelvic CT revealed progression of peritoneal carcinomatosis predominantly in the perihepatic space, bilateral lower quadrants and pelvis.  There was worsening bilateral obstructive uropathy due to malignant involvement of the pelvic ureters.  There was stable small pleural/subpleural nodules at the right lung base.  There was stable small left pleural effusion.  There was decreased small volume perihepatic ascites.  08/09/2016:  Abdomen and pelvic CT revealed interval increase and loculated fluid collections within the peritoneal space consistent with progression of peritoneal ovarian carcinoma metastasis.  There was one large 7 cm collection in the RIGHT lower quadrant which had increased  significantly in size and may represent of point of small bowel obstruction.  The proximal stomach and duodenum were decompressed. The mid small bowel was mildly dilated. There was concern for obstruction given the large intraperitoneal fluid collection.  There was increase in subcapsular hepatic metastatic fluid collections.  There was enlargement of rounded ampullary lesion  in the second portion duodenum concerning for metastatic lesion.  There was mild biliary duct dilatation (stable).  There was nodular pleural metastasis in the RIGHT lower lobe pleural space. 11/03/2016:  Abdomen and pelvic CT revealed multifocal cystic metastases in the abdomen/pelvis, reflecting peritoneal disease.  The dominant lesion in the left anterior abdomen has progressed, while additional lesions were mixed.  Peritoneal disease along the liver and spleen progressed.  Pleural-based metastases along the posterior right hemithorax progressed. There were trace left pleural effusion.  There was a stable 1.7 cm duodenal lesion at the ampulla. 11/29/2016:  Abdomen and pelvic CT revealed mild interval enlargement of pleural metastasis the RIGHT lower lobe.  There was overall mild decrease in cystic peritoneal metastasis within the abdomen pelvis. The solid lesion at the terminal ileum was decreased in size. Subcapsular lesion in the liver was slightly decreased in size.  There was no evidence of disease progression the abdomen pelvis.  There was mild intrahepatic duct dilatation similar prior.  The ampullary lesion was slightly larger.  There was mild hydronephrosis and hydroureter on the LEFT likely related to cystic metastasis at the LEFT vesicoureteral junction 12/09/2016:  MRCP revealed the cystic lesion within the pancreatic head/uncinate process had decreased in size and demonstrated no suspicious characteristics. This can be presumed a pseudocyst or focus of side branch duct ectasia.  There was similar to slight progression of  extensive peritoneal metastasis since 11/29/2016. Grossly similar abdominal nodal and right pleural metastasis.  There was chronic mild common duct dilatation with similar soft tissue fullness about the ampulla compared to 11/29/2016.  There was improved to resolved left-sided hydronephrosis. 01/20/2017:  Abdomen and pelvic CT revealed complex mixed interval changes in widespread metastatic disease in the peritoneal cavity, lower chest and thoracic, abdominal and pelvic lymph nodes. Right lower chest pleural metastasis and several of the peritoneal tumor implants had increased.  There was new right inguinal lymphadenopathy.  Some of the peritoneal tumor implants had decreased.  The small dependent left pleural effusion was slightly increased .  The mild left hydroureteronephrosis was improved.  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   The chronic biliary ductal dilatation was stable .  There was no evidence of bowel obstruction. 05/22/2017:  Chest, abdomen, and pelvic CT revealed  significant enlargement of metastatic disease involving the right pleura, low paratracheal nodal chain, omentum, peritoneal surfaces, mesentery, and liver. Tumors extending through the ostomy site into the subcutaneous tissues. There were multiple new metastatic liver and right pleural lesions.  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  There was new sclerotic lesion in the T11 vertebral body, concerning for possible metastatic disease. There was healing anterior nondisplaced rib fractures of the right second through ninth ribs that appeared new. 07/10/2017:  Chest, abdomen and pelvic CT revealed RIGHT lower lobe pleural-based nodules, slightly smaller.  There was stable bilateral pleural effusions (left > right) and stable mediastinal adenopathy.  There was evidence of progression of disease in the abdomen  pelvis.  There was interval increase in size of hepatic lesions most noticeably in the LEFT hepatic lobe.  There was interval increase in bilateral hydronephrosis and hydroureter.  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  There was interval increase in size of large peritoneal and pelvic peritoneal implants.  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  Admissions: [facility]  from 06/15/2015 - 06/22/2015.  She underwent exploratory laparotomy, lysis of adhesions, total abdominal hysterectomy with bilateral salpingo-oophorectomy, infracolic omentectomy, optimal tumor debulking(< 1 cm), recto-sigmoid resection with creation of end colostomy, cholecystectomy, mobilization of splenic flexure and liver with diaphragmatic stripping on 06/15/2015. The right diaphragm was cleared of tumor. During dissection, the diaphragm was entered and closed with sutures.  [place] from 07/01/2015 - 07/03/2015 with pleural effusion. [place] from 07/07/2015 - 07/09/2015 with recurrent pleural effusion. [place] from 07/29/2015 - 08/10/2015 with a right-sided empyema and liver abscess. She underwent CT-guided placement of a liver abscess drain on 07/30/2015.  Liver abscess culture grew out group B strep and Enterobacter which was sensitive to Zosyn. She was transitioned to ertapenem Colbert Ewing) prior to discharge.  She was readmitted to [facility] on 09/17/2015. [place] from 05/26/2016 - 05/28/2016 with altered mental status and an acute embolic CVA.  Head MRI on 05/26/2016 revealed multiple small areas of acute infarct involving the occipital parietal lobe bilaterally and the left lateral cerebellum,  consistent with posterior circulation emboli.  Work-up included a negative carotid ultrasound and echocardiogram. [facility] from 08/10/2016 - 08/14/2016 with a small bowel obstruction.  She was managed conservatively. [place] from 09/27/2016 - 09/28/2016 with  symptomatic anemia and diarrhea.   Infusion on 07/17/2017  Component Date Value Ref Range Status  . Magnesium 07/17/2017 1.5* 1.7 - 2.4 mg/dL Final   Performed at [facility], [address] [place], [place] [id number]  . Sodium 07/17/2017 136  135 - 145 mmol/L Final  . Potassium 07/17/2017 4.0  3.5 - 5.1 mmol/L Final  . Chloride 07/17/2017 100* 101 - 111 mmol/L Final  . CO2 07/17/2017 26  22 - 32 mmol/L Final  . Glucose, Bld 07/17/2017 150* 65 - 99 mg/dL Final  . BUN 07/17/2017 13  6 - 20 mg/dL Final  . Creatinine, Ser 07/17/2017 0.67  0.44 - 1.00 mg/dL Final  . Calcium 07/17/2017 9.0  8.9 - 10.3 mg/dL Final  . GFR calc non Af Amer 07/17/2017 >60  >60 mL/min Final  . GFR calc Af Amer 07/17/2017 >60  >60 mL/min Final   Comment: (NOTE) The eGFR has been calculated using the CKD EPI equation. This calculation has not been validated in all clinical situations. eGFR's persistently <60 mL/min signify possible Chronic Kidney Disease.   Georgiann Hahn gap 07/17/2017 10  5 - 15 Final   Performed at [facility], [address] [place], [place] [address]  . WBC 07/17/2017 3.9  3.6 - 11.0 K/uL Final  . RBC 07/17/2017 2.97* 3.80 - 5.20 MIL/uL Final  . Hemoglobin 07/17/2017 8.9* 12.0 - 16.0 g/dL Final  . HCT 07/17/2017 26.6* 35.0 - 47.0 % Final  . MCV 07/17/2017 89.7  80.0 - 100.0 fL Final  . MCH 07/17/2017 29.8  26.0 - 34.0 pg Final  . MCHC 07/17/2017 33.3  32.0 - 36.0 g/dL Final  . RDW 07/17/2017 19.8* 11.5 - 14.5 % Final  . Platelets 07/17/2017 283  150 - 440 K/uL Final  . Neutrophils Relative % 07/17/2017 75  % Final  . Neutro Abs 07/17/2017 2.9  1.4 - 6.5 K/uL Final  . Lymphocytes Relative 07/17/2017 14  % Final  . Lymphs Abs 07/17/2017 0.5* 1.0 - 3.6 K/uL Final  . Monocytes Relative 07/17/2017 9  % Final  . Monocytes Absolute 07/17/2017 0.4  0.2 - 0.9 K/uL Final  . Eosinophils Relative 07/17/2017 1  % Final  . Eosinophils Absolute 07/17/2017 0.0  0 - 0.7 K/uL Final  .  Basophils Relative 07/17/2017 1  % Final  . Basophils Absolute 07/17/2017 0.1  0 - 0.1 K/uL Final   Performed at [facility], [address] [place], [place] [address]    Assessment:  SANAIYAH KIRCHHOFF is a 72 y.o. female with progressive stage IV ovarian cancer.  She presented with abdominal discomfort and bloating.  Omental biopsy on 02/23/2015 revealed metastatic high grade serous carcinoma, consistent with gynecologic origin.   She was initially diagnosed with clinical stage IIIC (T3cN1Mx).  CA125 was 707 on 02/17/2015.  She received 4 cycles of neoadjuvant carboplatin and Taxol (03/05/2015 - 05/22/2015).  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  She had loose stools on oral magnesium.  She was initially on Neurontin then switched Lyrica with cycle #3.  Cycle #4 was notable for neutropenia (ANC 300) requiring GCSF x 3 days.    She underwent exploratory laparotomy, lysis of adhesions, total abdominal hysterectomy with bilateral salpingo-oophorectomy, infracolic omentectomy, optimal tumor debulking(< 1 cm), recto-sigmoid resection with creation of end colostomy, cholecystectomy, mobilization of splenic flexure and liver with diaphragmatic stripping on 06/15/2015. The right diaphragm was cleared of tumor. During dissection, the diaphragm was entered and closed with sutures.  She received tamoxifen from 02/25/2016 - 03/14/2016.  She received 4 cycles of carboplatin and gemcitabine (03/21/2016 - 05/24/2016) with GCSF/Neulasta support.  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  Cymbalta began on 02/24/2017  PDL-1 testing revealed a combined score was 5 (>=1 positive).     [facility] genetic testing negative for APC, ATM, BMPR1A, BRCA1, BRCA2, BRIP1, CDH1, CDK4, CDKN2A, CHEK2, EPCAM, MLH1, MSH2, MSH6, MUTYH, NBN, PALB2, PMS2, PTEN, RAD51C, RAD51D, SMAD4, STK11, TP53. Sequencing for select regions of POLE and POLD1, and large rearrangement analysis for select regions of  GREM1 were negative.  She received 2 cycles of Doxil (06/21/2016 - 07/19/2016) with Neulasta support.  She received 4 cycles of topotecan with Neulasta support (08/22/2016 - 10/10/2016).    She received 3 cycles of pembrolizumab (Keytruda) from 12/06/2016 -  01/09/2017.  Cycle #1 was complicated by neutropenia (ANC 400) requiring GCSF/Granix x 3 days.  She received GCSF x 5 days with cycle #2.  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 Right lower chest pleural metastasis and several of the peritoneal tumor implants had increased.  There was new right inguinal lymphadenopathy.  Some of the peritoneal tumor implants had decreased.  The small dependent left pleural effusion was slightly increased .  The mild left hydroureteronephrosis was improved.  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   The chronic biliary ductal dilatation was stable .  There was no evidence of bowel obstruction.  CA125 was 802.9 on 03/30/2015, 567.9 on 04/13/2015, 168.8 on 05/15/2015, 85.2 on 07/17/2015, 68.6 on 07/28/2015, 34.5 on 09/17/2015, 20.7 on 11/06/2015, 49 on 01/22/2016, 106.1 on 02/22/2016, 138.8 on 03/07/2016, 220.8 on 03/21/2016, 152.8 on 04/11/2016, 125.6 on 04/18/2016, 76.8 on 05/03/2016, 60.2 on 05/24/2016, 44 on 07/19/2016, 65.8 on 08/17/2016, 53.4 on 09/12/2016, 47.3 on 10/10/2016, 91 on 12/20/2016, 141.1 on 01/09/2017, 463.7 on 02/27/2017, 907.1 on 03/27/2017, 192.1 on 05/22/2017, 189.1 on 05/24/2017, and 144.3 on 06/19/2017.  She has a history of recurrent right sided pleural effusion.  She underwent thoracentesis of 650 cc on post-operative day 3.  She was admitted to [facility] on 07/01/2015 and 07/07/2015 for recurrent shortness of breath.  She underwent  2 additional thoracenteses (1.1 L on 07/02/2015 and 850 cc on 07/08/2015).  Cytology was negative x 2.  Bilateral  lower extremity duplex on 07/03/2015 was negative.  Echo revealed an EF of 55-60% on 07/08/2015 and 60-65% on 05/27/2016.    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 She was on Lovenox 60 mg twice a day.  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  She has severe rheumatoid arthritis.  Methotrexate and Enbrel were initially on hold.  She has a normocytic anemia.  Work-up on 02/17/2015 and 05/24/2016 revealed a normal ferritin, B12, folate, TSH.  She denies any melena or hematochezia.    She has anemia due to chronic disease. She received 1 unit PRBCs during her admission at [facility]. She denies any melena or hematochezia. She has diabetes and is on an insulin pump.  She has had persistent neutropenia felt secondary to her rheumatoid arthritis.  Folate and MMA were normal.  TSH was 6.13 (high) with a free T4 of 1.17 (0.61-1.12).  She began methotrexate (10 mg a week) and prednisone (5 mg a day) for severe rheumatoid arthritis on 12/17/2015.  She remains on prednisone alone (5 mg a day).  Bone marrow aspirate and biopsy on 06/09/2010 revealed a hypercellular marrow (70%) with no evidence of dysplasia or malignancy.  Flow cytometry was negative.  Cytogenetics were normal (46,XX).  FISH studies were negative for MDS.  Bone marrow aspirate and biopsy on 12/03/2015 revealed a normocellular to mildy hypercellular marrow for age (40%) with left shifted myelopoiesis, non specific dyserythropoiesis and mild megakaryocytic atypia with no increase in blasts.  There were multiple small nonspecific lymphoid aggregates (favor reactive). There was no increase in reticulin.  There was decreased myeloid cells (37%) with left shifted maturation and 1% atypical myelod blasts.  There was relatively increased monocytic cells (11%),  relatively increased lymphoid cells (36%), and relatively increased eosinophils (6%).  Cytogenetics were normal (29, XX).  SNP microarray was normal.  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  She receives IV magnesium twice weekly.    She has chemotherapy induced anemia.  She has received Procrit (last 06/26/2017).  She received 1 unit of PRBCs on 06/05/2017.  Labs on 08/22/2016 revealed a normal B12 and folate.  Ferritin was 201, iron saturation 21% and TIBC 307 on 06/09/2017.  B12 and folate were normal.  Code status is DNR/DNI.  She developed acute LFT elevation on 11/28/2016.  Event was preceded by upper abdominal discomfort.  Hepatitis B core antibody total and hepatitis C antibody were negative.  She received 5 cycles of Abraxane (03/13/2017 - 06/26/2017).  Cycle #1 was truncated after day 1 secondary to side effects and Thanksgiving.  Chest, abdomen and pelvic CT on 07/10/2017 revealed progression of disease in the abdomen pelvis.  There was interval increase in size of hepatic lesions most noticeably in the LEFT hepatic lobe.  There was interval increase in bilateral hydronephrosis and hydroureter.  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  There was interval increase in size of large peritoneal and pelvic peritoneal implants.  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  She underwent bilateral ureteral stent placement on 07/13/2017.  She was admitted to Hospice on 07/15/2017.  Symptomatically, she has abdominal discomfort.  Patient is having gross  hematuria following stent replacement. Exam is stable. Magnesium 1.5. Hemoglobin 8.9.  Plan: 1.  Labs today: CBC with diff, BMP, Mg 2.  Discuss low magnesium of 1.5. Will replace with 6 grams of IV magnesium today.  3.  Discuss anemia. Hemoglobin stable. No transfusion needed at this time. 4.  Discuss gross hematuria. Blood counts  likely to drop if this continues. Patient to follow up with Dr. Erlene Quan.  5.  Discuss Hospice services. Patient receiving no further chemotherapy. Will continue with support care (labs, PRBCs, Mg). Coordinate care with Hospice. 6.  RTC on 07/21/2017, 07/24/2017, and 07/28/2017 for labs (CBC with diff, Mg) and IV Mg.  7.  RTC on 07/31/2017 for MD assessment and labs (CBC with diff, BMP, Mg) and IV Mg.    Honor Loh, NP  07/17/17, 10:56 AM   I saw and evaluated the patient, participating in the key portions of the service and reviewing pertinent diagnostic studies and records.  I reviewed the nurse practitioner's note and agree with the findings and the plan.  The assessment and plan were discussed with the patient.  Several questions were asked by the patient and answered.   Nolon Stalls, MD 07/17/17, 10:56 AM

## 2017-07-18 ENCOUNTER — Ambulatory Visit: Payer: Medicare Other

## 2017-07-19 ENCOUNTER — Ambulatory Visit
Admission: RE | Admit: 2017-07-19 | Discharge: 2017-07-19 | Disposition: A | Discharge status: Home / Self Care | Source: Ambulatory Visit | Attending: Urology | Admitting: Urology

## 2017-07-19 DIAGNOSIS — Z87448 Personal history of other diseases of urinary system: Secondary | ICD-10-CM | POA: Insufficient documentation

## 2017-07-19 DIAGNOSIS — N1330 Unspecified hydronephrosis: Secondary | ICD-10-CM

## 2017-07-20 ENCOUNTER — Ambulatory Visit: Payer: Medicare Other | Admitting: Urology

## 2017-07-20 ENCOUNTER — Ambulatory Visit: Payer: Medicare Other

## 2017-07-20 ENCOUNTER — Telehealth: Payer: Self-pay

## 2017-07-20 NOTE — Telephone Encounter (Signed)
-----   Message from Hollice Espy, MD sent at 07/20/2017  7:56 AM EDT ----- Stents are working beautifully.  Hopefully you are tolerating them well. We will need to discuss stent exchange in a few months as scheduled.    Hollice Espy, MD

## 2017-07-20 NOTE — Telephone Encounter (Signed)
Letter sent.

## 2017-07-21 ENCOUNTER — Inpatient Hospital Stay: Payer: Medicare Other

## 2017-07-21 DIAGNOSIS — C800 Disseminated malignant neoplasm, unspecified: Principal | ICD-10-CM

## 2017-07-21 DIAGNOSIS — C561 Malignant neoplasm of right ovary: Secondary | ICD-10-CM

## 2017-07-21 LAB — CBC WITH DIFFERENTIAL/PLATELET
Basophils Absolute: 0.1 10*3/uL (ref 0–0.1)
Basophils Relative: 3 %
Eosinophils Absolute: 0 10*3/uL (ref 0–0.7)
Eosinophils Relative: 1 %
HCT: 27.6 % — ABNORMAL LOW (ref 35.0–47.0)
Hemoglobin: 9.1 g/dL — ABNORMAL LOW (ref 12.0–16.0)
Lymphocytes Relative: 13 %
Lymphs Abs: 0.6 10*3/uL — ABNORMAL LOW (ref 1.0–3.6)
MCH: 29.3 pg (ref 26.0–34.0)
MCHC: 32.9 g/dL (ref 32.0–36.0)
MCV: 89.3 fL (ref 80.0–100.0)
Monocytes Absolute: 0.6 10*3/uL (ref 0.2–0.9)
Monocytes Relative: 12 %
Neutro Abs: 3.4 10*3/uL (ref 1.4–6.5)
Neutrophils Relative %: 71 %
Platelets: 302 10*3/uL (ref 150–440)
RBC: 3.09 MIL/uL — ABNORMAL LOW (ref 3.80–5.20)
RDW: 18.7 % — ABNORMAL HIGH (ref 11.5–14.5)
WBC: 4.7 10*3/uL (ref 3.6–11.0)

## 2017-07-21 LAB — MAGNESIUM: Magnesium: 1.4 mg/dL — ABNORMAL LOW (ref 1.7–2.4)

## 2017-07-21 MED ORDER — SODIUM CHLORIDE 0.9 % IV SOLN
6.0000 g | Freq: Once | INTRAVENOUS | Status: AC
  Administered 2017-07-21: 6 g via INTRAVENOUS
  Filled 2017-07-21: qty 12

## 2017-07-21 MED ORDER — HEPARIN SOD (PORK) LOCK FLUSH 100 UNIT/ML IV SOLN
500.0000 [IU] | Freq: Once | INTRAVENOUS | Status: AC
  Administered 2017-07-21: 500 [IU] via INTRAVENOUS
  Filled 2017-07-21: qty 5

## 2017-07-21 MED ORDER — SODIUM CHLORIDE 0.9 % IV SOLN
INTRAVENOUS | Status: DC
  Administered 2017-07-21: 11:00:00 via INTRAVENOUS
  Filled 2017-07-21: qty 1000

## 2017-07-24 ENCOUNTER — Inpatient Hospital Stay: Payer: Medicare Other

## 2017-07-24 ENCOUNTER — Other Ambulatory Visit: Payer: Medicare Other

## 2017-07-24 ENCOUNTER — Ambulatory Visit: Payer: Medicare Other | Admitting: Hematology and Oncology

## 2017-07-24 ENCOUNTER — Ambulatory Visit: Payer: Medicare Other

## 2017-07-24 DIAGNOSIS — C561 Malignant neoplasm of right ovary: Secondary | ICD-10-CM | POA: Diagnosis not present

## 2017-07-24 DIAGNOSIS — C800 Disseminated malignant neoplasm, unspecified: Principal | ICD-10-CM

## 2017-07-24 LAB — COMPREHENSIVE METABOLIC PANEL
ALT: 9 U/L — ABNORMAL LOW (ref 14–54)
AST: 12 U/L — ABNORMAL LOW (ref 15–41)
Albumin: 3.2 g/dL — ABNORMAL LOW (ref 3.5–5.0)
Alkaline Phosphatase: 85 U/L (ref 38–126)
Anion gap: 7 (ref 5–15)
BUN: 18 mg/dL (ref 6–20)
CO2: 26 mmol/L (ref 22–32)
Calcium: 9 mg/dL (ref 8.9–10.3)
Chloride: 99 mmol/L — ABNORMAL LOW (ref 101–111)
Creatinine, Ser: 0.7 mg/dL (ref 0.44–1.00)
GFR calc Af Amer: >60 mL/min (ref >60)
GFR calc non Af Amer: >60 mL/min (ref >60)
Glucose, Bld: 314 mg/dL — ABNORMAL HIGH (ref 65–99)
Potassium: 4.1 mmol/L (ref 3.5–5.1)
Sodium: 132 mmol/L — ABNORMAL LOW (ref 135–145)
Total Bilirubin: 0.7 mg/dL (ref 0.3–1.2)
Total Protein: 6.6 g/dL (ref 6.5–8.1)

## 2017-07-24 LAB — CBC WITH DIFFERENTIAL/PLATELET
Basophils Absolute: 0 10*3/uL (ref 0–0.1)
Basophils Relative: 1 %
Eosinophils Absolute: 0 10*3/uL (ref 0–0.7)
Eosinophils Relative: 1 %
HCT: 26.5 % — ABNORMAL LOW (ref 35.0–47.0)
Hemoglobin: 8.7 g/dL — ABNORMAL LOW (ref 12.0–16.0)
Lymphocytes Relative: 11 %
Lymphs Abs: 0.4 10*3/uL — ABNORMAL LOW (ref 1.0–3.6)
MCH: 29.6 pg (ref 26.0–34.0)
MCHC: 32.7 g/dL (ref 32.0–36.0)
MCV: 90.4 fL (ref 80.0–100.0)
Monocytes Absolute: 0.5 10*3/uL (ref 0.2–0.9)
Monocytes Relative: 15 %
Neutro Abs: 2.6 10*3/uL (ref 1.4–6.5)
Neutrophils Relative %: 72 %
Platelets: 273 10*3/uL (ref 150–440)
RBC: 2.93 MIL/uL — ABNORMAL LOW (ref 3.80–5.20)
RDW: 18.5 % — ABNORMAL HIGH (ref 11.5–14.5)
WBC: 3.6 10*3/uL (ref 3.6–11.0)

## 2017-07-24 LAB — MAGNESIUM: Magnesium: 1.4 mg/dL — ABNORMAL LOW (ref 1.7–2.4)

## 2017-07-24 MED ORDER — HEPARIN SOD (PORK) LOCK FLUSH 100 UNIT/ML IV SOLN
500.0000 [IU] | Freq: Once | INTRAVENOUS | Status: AC
  Administered 2017-07-24: 500 [IU] via INTRAVENOUS

## 2017-07-24 MED ORDER — SODIUM CHLORIDE 0.9 % IV SOLN
Freq: Once | INTRAVENOUS | Status: AC
  Administered 2017-07-24: 09:00:00 via INTRAVENOUS
  Filled 2017-07-24: qty 1000

## 2017-07-24 MED ORDER — SODIUM CHLORIDE 0.9 % IV SOLN
6.0000 g | Freq: Once | INTRAVENOUS | Status: AC
  Administered 2017-07-24: 6 g via INTRAVENOUS
  Filled 2017-07-24: qty 10

## 2017-07-24 NOTE — Progress Notes (Signed)
Per MD orders. If magnesium is < or = to 1.4, pt to receive 6 grams IV magnesium. Today's magnesium 1.4.

## 2017-07-28 ENCOUNTER — Inpatient Hospital Stay: Payer: Medicare Other

## 2017-07-28 ENCOUNTER — Other Ambulatory Visit: Payer: Self-pay | Admitting: *Deleted

## 2017-07-28 VITALS — BP 125/58 | HR 88 | Temp 96.6°F | Resp 18

## 2017-07-28 DIAGNOSIS — C561 Malignant neoplasm of right ovary: Secondary | ICD-10-CM

## 2017-07-28 DIAGNOSIS — C800 Disseminated malignant neoplasm, unspecified: Principal | ICD-10-CM

## 2017-07-28 LAB — CBC WITH DIFFERENTIAL/PLATELET
Basophils Absolute: 0 10*3/uL (ref 0–0.1)
Basophils Relative: 1 %
Eosinophils Absolute: 0 10*3/uL (ref 0–0.7)
Eosinophils Relative: 2 %
HCT: 26.9 % — ABNORMAL LOW (ref 35.0–47.0)
Hemoglobin: 8.9 g/dL — ABNORMAL LOW (ref 12.0–16.0)
Lymphocytes Relative: 17 %
Lymphs Abs: 0.5 10*3/uL — ABNORMAL LOW (ref 1.0–3.6)
MCH: 29.5 pg (ref 26.0–34.0)
MCHC: 33.1 g/dL (ref 32.0–36.0)
MCV: 89 fL (ref 80.0–100.0)
Monocytes Absolute: 0.5 10*3/uL (ref 0.2–0.9)
Monocytes Relative: 15 %
Neutro Abs: 2.1 10*3/uL (ref 1.4–6.5)
Neutrophils Relative %: 65 %
Platelets: 280 10*3/uL (ref 150–440)
RBC: 3.02 MIL/uL — ABNORMAL LOW (ref 3.80–5.20)
RDW: 17.9 % — ABNORMAL HIGH (ref 11.5–14.5)
WBC: 3.2 10*3/uL — ABNORMAL LOW (ref 3.6–11.0)

## 2017-07-28 LAB — MAGNESIUM: Magnesium: 1.4 mg/dL — ABNORMAL LOW (ref 1.7–2.4)

## 2017-07-28 MED ORDER — SODIUM CHLORIDE 0.9 % IV SOLN
Freq: Once | INTRAVENOUS | Status: AC
  Administered 2017-07-28: 10:00:00 via INTRAVENOUS
  Filled 2017-07-28: qty 1000

## 2017-07-28 MED ORDER — SODIUM CHLORIDE 0.9% FLUSH
10.0000 mL | Freq: Once | INTRAVENOUS | Status: AC
  Administered 2017-07-28: 10 mL via INTRAVENOUS
  Filled 2017-07-28: qty 10

## 2017-07-28 MED ORDER — HEPARIN SOD (PORK) LOCK FLUSH 100 UNIT/ML IV SOLN
250.0000 [IU] | Freq: Once | INTRAVENOUS | Status: DC | PRN
  Filled 2017-07-28: qty 5

## 2017-07-28 MED ORDER — SODIUM CHLORIDE 0.9 % IV SOLN
6.0000 g | Freq: Once | INTRAVENOUS | Status: AC
  Administered 2017-07-28: 6 g via INTRAVENOUS
  Filled 2017-07-28: qty 10

## 2017-07-28 MED ORDER — HEPARIN SOD (PORK) LOCK FLUSH 100 UNIT/ML IV SOLN
500.0000 [IU] | Freq: Once | INTRAVENOUS | Status: AC
  Administered 2017-07-28: 500 [IU] via INTRAVENOUS

## 2017-07-28 NOTE — Progress Notes (Signed)
Per Honor Loh NP no granix or procrit at this time. Pt to receive 6 grams of Magnesium with magnesium level of 1.4 07/28/16 per MD order.

## 2017-07-31 ENCOUNTER — Inpatient Hospital Stay: Attending: Hematology and Oncology

## 2017-07-31 ENCOUNTER — Encounter: Payer: Self-pay | Admitting: Hematology and Oncology

## 2017-07-31 ENCOUNTER — Inpatient Hospital Stay

## 2017-07-31 ENCOUNTER — Other Ambulatory Visit: Payer: Self-pay | Admitting: Urgent Care

## 2017-07-31 ENCOUNTER — Inpatient Hospital Stay: Admitting: Hematology and Oncology

## 2017-07-31 VITALS — BP 136/75 | HR 92 | Temp 96.3°F | Resp 18 | Wt 121.1 lb

## 2017-07-31 DIAGNOSIS — D709 Neutropenia, unspecified: Secondary | ICD-10-CM

## 2017-07-31 DIAGNOSIS — E109 Type 1 diabetes mellitus without complications: Secondary | ICD-10-CM | POA: Diagnosis not present

## 2017-07-31 DIAGNOSIS — E785 Hyperlipidemia, unspecified: Secondary | ICD-10-CM | POA: Diagnosis not present

## 2017-07-31 DIAGNOSIS — M069 Rheumatoid arthritis, unspecified: Secondary | ICD-10-CM

## 2017-07-31 DIAGNOSIS — Z79899 Other long term (current) drug therapy: Secondary | ICD-10-CM | POA: Diagnosis not present

## 2017-07-31 DIAGNOSIS — E876 Hypokalemia: Secondary | ICD-10-CM

## 2017-07-31 DIAGNOSIS — I2510 Atherosclerotic heart disease of native coronary artery without angina pectoris: Secondary | ICD-10-CM

## 2017-07-31 DIAGNOSIS — F329 Major depressive disorder, single episode, unspecified: Secondary | ICD-10-CM

## 2017-07-31 DIAGNOSIS — I252 Old myocardial infarction: Secondary | ICD-10-CM | POA: Insufficient documentation

## 2017-07-31 DIAGNOSIS — C561 Malignant neoplasm of right ovary: Secondary | ICD-10-CM | POA: Diagnosis not present

## 2017-07-31 DIAGNOSIS — K222 Esophageal obstruction: Secondary | ICD-10-CM | POA: Insufficient documentation

## 2017-07-31 DIAGNOSIS — I509 Heart failure, unspecified: Secondary | ICD-10-CM

## 2017-07-31 DIAGNOSIS — Z90710 Acquired absence of both cervix and uterus: Secondary | ICD-10-CM | POA: Diagnosis not present

## 2017-07-31 DIAGNOSIS — Z87891 Personal history of nicotine dependence: Secondary | ICD-10-CM | POA: Insufficient documentation

## 2017-07-31 DIAGNOSIS — C786 Secondary malignant neoplasm of retroperitoneum and peritoneum: Secondary | ICD-10-CM | POA: Diagnosis not present

## 2017-07-31 DIAGNOSIS — D6481 Anemia due to antineoplastic chemotherapy: Secondary | ICD-10-CM | POA: Diagnosis not present

## 2017-07-31 DIAGNOSIS — Z9221 Personal history of antineoplastic chemotherapy: Secondary | ICD-10-CM | POA: Insufficient documentation

## 2017-07-31 DIAGNOSIS — N135 Crossing vessel and stricture of ureter without hydronephrosis: Secondary | ICD-10-CM

## 2017-07-31 DIAGNOSIS — D72819 Decreased white blood cell count, unspecified: Secondary | ICD-10-CM | POA: Diagnosis not present

## 2017-07-31 DIAGNOSIS — M129 Arthropathy, unspecified: Secondary | ICD-10-CM | POA: Diagnosis not present

## 2017-07-31 DIAGNOSIS — I10 Essential (primary) hypertension: Secondary | ICD-10-CM

## 2017-07-31 DIAGNOSIS — R310 Gross hematuria: Secondary | ICD-10-CM | POA: Insufficient documentation

## 2017-07-31 DIAGNOSIS — K219 Gastro-esophageal reflux disease without esophagitis: Secondary | ICD-10-CM | POA: Diagnosis not present

## 2017-07-31 DIAGNOSIS — I998 Other disorder of circulatory system: Secondary | ICD-10-CM | POA: Diagnosis not present

## 2017-07-31 DIAGNOSIS — Z7189 Other specified counseling: Secondary | ICD-10-CM

## 2017-07-31 DIAGNOSIS — Z801 Family history of malignant neoplasm of trachea, bronchus and lung: Secondary | ICD-10-CM | POA: Insufficient documentation

## 2017-07-31 DIAGNOSIS — T451X5S Adverse effect of antineoplastic and immunosuppressive drugs, sequela: Secondary | ICD-10-CM

## 2017-07-31 DIAGNOSIS — Z90722 Acquired absence of ovaries, bilateral: Secondary | ICD-10-CM

## 2017-07-31 DIAGNOSIS — C800 Disseminated malignant neoplasm, unspecified: Principal | ICD-10-CM

## 2017-07-31 DIAGNOSIS — Z794 Long term (current) use of insulin: Secondary | ICD-10-CM | POA: Insufficient documentation

## 2017-07-31 DIAGNOSIS — C801 Malignant (primary) neoplasm, unspecified: Secondary | ICD-10-CM

## 2017-07-31 LAB — CBC WITH DIFFERENTIAL/PLATELET
Basophils Absolute: 0.1 10*3/uL (ref 0–0.1)
Basophils Relative: 2 %
Eosinophils Absolute: 0.1 10*3/uL (ref 0–0.7)
Eosinophils Relative: 2 %
HCT: 28.4 % — ABNORMAL LOW (ref 35.0–47.0)
Hemoglobin: 9.4 g/dL — ABNORMAL LOW (ref 12.0–16.0)
Lymphocytes Relative: 17 %
Lymphs Abs: 0.6 10*3/uL — ABNORMAL LOW (ref 1.0–3.6)
MCH: 29.2 pg (ref 26.0–34.0)
MCHC: 33.2 g/dL (ref 32.0–36.0)
MCV: 87.7 fL (ref 80.0–100.0)
Monocytes Absolute: 0.5 10*3/uL (ref 0.2–0.9)
Monocytes Relative: 14 %
Neutro Abs: 2.5 10*3/uL (ref 1.4–6.5)
Neutrophils Relative %: 67 %
Platelets: 302 10*3/uL (ref 150–440)
RBC: 3.24 MIL/uL — ABNORMAL LOW (ref 3.80–5.20)
RDW: 17.6 % — ABNORMAL HIGH (ref 11.5–14.5)
WBC: 3.8 10*3/uL (ref 3.6–11.0)

## 2017-07-31 LAB — BASIC METABOLIC PANEL
Anion gap: 8 (ref 5–15)
BUN: 19 mg/dL (ref 6–20)
CO2: 25 mmol/L (ref 22–32)
Calcium: 9.2 mg/dL (ref 8.9–10.3)
Chloride: 102 mmol/L (ref 101–111)
Creatinine, Ser: 0.8 mg/dL (ref 0.44–1.00)
GFR calc Af Amer: >60 mL/min (ref >60)
GFR calc non Af Amer: >60 mL/min (ref >60)
Glucose, Bld: 201 mg/dL — ABNORMAL HIGH (ref 65–99)
Potassium: 3.9 mmol/L (ref 3.5–5.1)
Sodium: 135 mmol/L (ref 135–145)

## 2017-07-31 LAB — MAGNESIUM: Magnesium: 1.4 mg/dL — ABNORMAL LOW (ref 1.7–2.4)

## 2017-07-31 MED ORDER — HEPARIN SOD (PORK) LOCK FLUSH 100 UNIT/ML IV SOLN
500.0000 [IU] | Freq: Once | INTRAVENOUS | Status: AC
  Administered 2017-07-31: 500 [IU] via INTRAVENOUS

## 2017-07-31 MED ORDER — SODIUM CHLORIDE 0.9 % IV SOLN
6.0000 g | Freq: Once | INTRAVENOUS | Status: AC
  Administered 2017-07-31: 6 g via INTRAVENOUS
  Filled 2017-07-31: qty 10

## 2017-07-31 MED ORDER — SODIUM CHLORIDE 0.9% FLUSH
10.0000 mL | INTRAVENOUS | Status: DC | PRN
  Administered 2017-07-31: 10 mL via INTRAVENOUS
  Filled 2017-07-31: qty 10

## 2017-07-31 NOTE — Progress Notes (Signed)
[facility] day:  07/31/2017   Chief Complaint: [name] is a 72 y.o. female with stage IV ovarian cancer who is seen for 2 week assessment.  HPI:  The patient was last seen in the medical oncology clinic on 07/17/2017.  At that time, she had abdominal discomfort.  Patient had gross hematuria following stent replacement. Exam was stable. Magnesium was 1.5. Hemoglobin was 8.9.  During the interim, patient has been doing well. Patient is in high spirits today. She is talkative, smiling, and laughing. She states, "I had a good last week. I got to go shopping with my sisters, and I didn't ever get tired". Patient developed a slight headache yesterday. She was unable to focus her vision. Patient advises that these symptoms lasted about one minute. Blood pressure and blood sugars were checked by the patient and found to be normal.  Patient denies issues with her bowels. She has continued gross hematuria. Patient states, "I have some burning sometimes". Patient denies B symptoms. She has had no interval infections. Neuropathy is stable.   Patient is eating "ok". Her weight is down 7 pounds. She denies pain in the clinic today.    Past Medical History:  Diagnosis Date  . Anemia   . Arthritis   . CAD (coronary artery disease)    a. 09/2012 Cath: LM nl, LAD 95p, 24m LCX 926mOM2 50, RCA 100.  . Marland KitchenHF (congestive heart failure) ([facility]  . Cholelithiasis   . Collagen vascular disease (HCC)    Rheumatoid Arthritis.  . Depression   . Environmental and seasonal allergies   . Esophageal stricture   . Exertional shortness of breath   . GERD (gastroesophageal reflux disease)   . H/O hiatal hernia   . Herniated disc   . History of pancytopenia   . Hyperlipidemia   . Hypertension   . Hypokalemia   . Leukopenia 2012   s/p bone marrow biopsy, Dr. PaMa Hillock. Neuropathy   . NSTEMI (non-ST elevated myocardial infarction) ([facility]/2014   "mild" (10/18/2012)  .  Ovarian cancer ([facility]2016   chemo and hysterectomy  . Pneumonia 2013; 08/2012   "one lung; double" (10/18/2012)  . Rheumatoid arthritis(714.0)   . Stroke ([facility]  . Type I diabetes mellitus (HCC[place]   "dx'd in 1957" (10/18/2012)  . Ureter obstruction     Past Surgical History:  Procedure Laterality Date  . ABDOMINAL HYSTERECTOMY  06/15/2015   Dx L/S, EXLAP TAH BSO omentectomy RSRx colostomy diaphragm resection stripping  . CARDIAC CATHETERIZATION  10/18/2012   "first one was today" (10/18/2012)  . CATARACT EXTRACTION W/ INTRAOCULAR LENS IMPLANT Right 2010  . CHOLECYSTECTOMY  06/15/2015   combined case with ovarian cancer debulking  . COLON SURGERY    . CORONARY ARTERY BYPASS GRAFT N/A 10/19/2012   Procedure: CORONARY ARTERY BYPASS GRAFTING (CABG);  Surgeon: StMelrose NakayamaMD;  [facility] Service: Open Heart Surgery;  Laterality: N/A;  . CYSTOSCOPY W/ RETROGRADES Bilateral 07/13/2017   Procedure: CYSTOSCOPY WITH RETROGRADE PYELOGRAM;  Surgeon: BrHollice EspyMD;  Location: [facility];  Service: Urology;  Laterality: Bilateral;  . CYSTOSCOPY WITH STENT PLACEMENT Bilateral 07/13/2017   Procedure: CYSTOSCOPY WITH STENT PLACEMENT;  Surgeon: BrHollice EspyMD;  Location: [facility] ORS;  Service: Urology;  Laterality: Bilateral;  . ESOPHAGEAL DILATION     "3 or 4 times" (10/19/2011)  . ESOPHAGOGASTRODUODENOSCOPY  2012   Dr. IfMinna Merritts. OSTOMY    .  OVARY SURGERY     removal  . PERIPHERAL VASCULAR CATHETERIZATION N/A 03/02/2015   Procedure: Glori Luis Cath Insertion;  Surgeon: Algernon Huxley, MD;  Location: [facility];  Service: Cardiovascular;  Laterality: N/A;  . TUBAL LIGATION  1970    Family History  Problem Relation Age of Onset  . Diabetes Mother   . Arthritis Mother   . Diabetes Father   . Arthritis Father   . Aneurysm Sister        neck  . Cancer Sister        lung    Social History:  reports that she quit smoking about 23 years ago. Her smoking use included  cigarettes. She has a 30.00 pack-year smoking history. She has never used smokeless tobacco. She reports that she does not drink alcohol or use drugs.  Patient is a retired Art therapist. Patient denies exposure to radiation and toxins. She spends every Thursday afternoon with her sister.  Her son is [name] and daughter is [name].  She is accompanied by her husband today.  Allergies:  Allergies  Allergen Reactions  . Codeine Nausea And Vomiting  . Latex Rash    Current Medications: Current Outpatient Medications  Medication Sig Dispense Refill  . Calcium-Vitamin D 600-200 MG-UNIT tablet Take 1 tablet by mouth 2 (two) times daily.     . cholecalciferol (VITAMIN D) 1000 units tablet Take 1,000 Units by mouth daily.    . DULoxetine (CYMBALTA) 20 MG capsule Take 1 capsule (20 mg total) by mouth daily. 90 capsule 2  . enoxaparin (LOVENOX) 60 MG/0.6ML injection INJECT 0.6ML (60MG TOTAL) UNDER THE SKIN EVERY 12 HOURS 36 mL 1  . folic acid (FOLVITE) 1 MG tablet Take 1 tablet (1 mg total) by mouth daily. 90 tablet 3  . furosemide (LASIX) 40 MG tablet Take 1 tablet (40 mg total) by mouth daily as needed for edema. 30 tablet 1  . insulin lispro (HUMALOG) 100 UNIT/ML injection Inject 0.06 mLs (6 Units total) into the skin daily. (Patient taking differently: Inject 0-85 Units into the skin daily. Via insulin pump) 30 mL 3  . lidocaine-prilocaine (EMLA) cream APPLY 1 APPLICATION TOPICALLY AS NEEDED 1 HOUR PRIOR TO TREATMENT. COVER WITH PRESS N SEAL UNTIL TREATMENT TIME AS DIRECTED 30 g 1  . loratadine (CLARITIN) 10 MG tablet Take 1 tablet by mouth daily as needed.    . magnesium oxide (MAG-OX) 400 (241.3 Mg) MG tablet TAKE 1 TABLET BY MOUTH ONCE DAILY 30 tablet 1  . metoprolol tartrate (LOPRESSOR) 25 MG tablet Take 0.5 tablets (12.5 mg total) by mouth daily after breakfast. 45 tablet 3  . ondansetron (ZOFRAN) 8 MG tablet TAKE 1 TABLET BY MOUTH EVERY 8 HOURS AS NEEDED FOR NAUSEA AND VOMITING 30 tablet 1  .  oxybutynin (DITROPAN) 5 MG tablet Take 1 tablet (5 mg total) by mouth every 8 (eight) hours as needed for bladder spasms. 30 tablet 2  . pantoprazole (PROTONIX) 40 MG tablet TAKE 1 TABLET BY MOUTH TWICE (2) DAILY 180 tablet 1  . potassium chloride SA (K-DUR,KLOR-CON) 20 MEQ tablet TAKE 1 TABLET BY MOUTH ONCE A DAY 90 tablet 1  . predniSONE (DELTASONE) 5 MG tablet Take 5 mg by mouth daily with breakfast.    . vitamin C (ASCORBIC ACID) 500 MG tablet Take 500 mg by mouth daily.    Marland Kitchen zinc gluconate 50 MG tablet Take 50 mg daily by mouth.     No current facility-administered medications for this visit.  Facility-Administered Medications Ordered in Other Visits  Medication Dose Route Frequency Provider Last Rate Last Dose  . 0.9 %  sodium chloride infusion   Intravenous Continuous Lequita Asal, MD   Stopped at 04/24/17 1140  . heparin lock flush 100 unit/mL  500 Units Intravenous Once Corcoran, Melissa C, MD      . magnesium sulfate IVPB 4 g 100 mL  4 g Intravenous Once Faythe Casa E, NP      . sodium chloride 0.9 % injection 10 mL  10 mL Intracatheter PRN Corcoran, Melissa C, MD      . sodium chloride 0.9 % injection 10 mL  10 mL Intravenous PRN Lequita Asal, MD   10 mL at 04/13/15 0852  . sodium chloride flush (NS) 0.9 % injection 10 mL  10 mL Intravenous PRN Lequita Asal, MD   10 mL at 07/31/17 0909    Review of Systems:  GENERAL:  Feels "ok".  Fatigue.  No fever, chills or sweats.  Weight down 7 pounds.  PERFORMANCE STATUS (ECOG):  1-2. HEENT:  Short episode of blurred changes x 1 on 07/30/2017. No sore throat, mouth sores or tenderness. Lungs:  Shortness of breath on exertion.  No cough.  No hemoptysis. Cardiac:  No chest pain, palpitations, orthopnea, or PND. GI:  Burning sensation in abdomen.  Nausea in AM, well managed.  No vomiting, diarrhea, melena or hematochezia.  GU:  Gross hematuria s/p ureteral stenting, improved.  Dysuria once in awhile.  No urgency or  frequency. Musculoskeletal:  Back pain.  Severe rheumatoid arthritis (on prednisone).  Osteoporosis.  No back pain. No muscle tenderness. Extremities:  No pain or swelling. Skin:  No increased bruising or bleeding.  No rashes or skin changes. Neuro:  Neuropathy in hands and feet (no change).  No headache, numbness or weakness, balance or coordination issues. Endocrine:  Diabetes on an insulin pump.  Blood sugar elevated on steroids.  Occasional hot flashes.  Psych:  No mood changes, depression or anxiety.  Sleeping more. Pain:  No focal pain.  Review of systems:  All other systems reviewed and found to be negative.  Physical Exam:  Blood pressure 136/75, pulse 92, temperature (!) 96.3 F (35.7 C), temperature source Tympanic, resp. rate 18, weight 121 lb 1 oz (54.9 kg). GENERAL:  Well developed, well nourished woman sitting comfortably in the exam room in no acute distress. MENTAL STATUS:  Alert and oriented to person, place and time. HEAD:  Wearing a blue jean cap.  Alopecia.  Normocephalic, atraumatic, face symmetric, no Cushingoid features. EYES:  Gold rimmed glasses.  Blue eyes.  No conjunctivitis or scleral icterus. Pupils equal round and reactive to light and accomodation. ENT: Oropharynx clear without lesion. Tongue normal. Mucous membranes moist.  RESPIRATORY: Decreased breath sounds right lower lobe.  No rales, wheezes or rhonchi. CARDIOVASCULAR: Regular rate and rhythm without murmur, rub or gallop. ABDOMEN: Soft, non-tender with active bowel sounds and no appreciable hepatosplenomegaly. Epigastric fullness.  2.5 cm mass superiorly under rim of ostomy appliance.  Palpable scattered peritoneal nodularity.  Insulin pump.  Ostomy bag. SKIN: Pale.  Abdominal wall scattered ecchymosis and small hematomas secondary to Lovenox.  No rashes or ulcers. EXTREMITIES:  No edema, skin discoloration or tenderness. No palpable cords. LYMPH NODES: No palpable cervical, supraclavicular,  axillary or inguinal adenopathy  NEUROLOGICAL: Appropriate. PSYCH: Appropriate.   Radiology studies: 02/13/2015:  Abdomen and pelvic CT revealed bilateral mass-like adnexal regions (right adnexal mass 5.6 x 5.0 cm and  the left adnexa mass 10.3 x 6.0 x 9.9 cm).  There was a large amount of soft tissue throughout the peritoneal cavity involving the omentum and other peritoneal surfaces.  There was a small volume ascites. There was a peripheral 3.3 x 1.9 cm low-attenuation lesion overlying the right lobe of the liver, likely representing a serosal implant. There was 1.4 x 2.2 cm ill-defined peripheral lesion within the inferior aspect of segment 6 adjacent to the ampulla in the duodenum.  04/28/2015:  Abdominal and pelvic CT revealed decreasing bilateral ovarian masses.   The left adnexal mass measured 4.0 x 6.1 cm (previously 5.0 x 8.5 cm). The right ovary measured 3.8 x 4.9 cm (previously 4.7 x 5.6 cm).  There was improved peritoneal carcinomatosis.  There was a small amount of ascites.  The hepatic dome lesion was stable. The previously seen right hepatic lobe lesion was not well-visualized.  There was a small left pleural effusion and trace right pleural effusion.  The nodular lesion at the ampulla of Vater, extending into the duodenum was stable.  There was no biliary ductal dilatation. 08/01/2015:  Rght upper extremity ultrasound revealed a near occlusive thrombus within the central portion of the right internal jugular vein and central portion of the right subclavian vein. 09/01/2015:  Chest, abdomen, and pelvic CT revealed continued decrease in perihepatic fluid collection contiguous with the right pleural space with percutaneous drain.  09/11/2015:  Chest, abdomen, and pelvic CT revealed a residual versus recurrent fluid collection posterior to the right hepatic lobe (2.6 x 1.1 x 4.0 cm).   10/13/2015:  Chest, abdomen, and pelvic CT revealed resolution of the empyema. 02/15/2016:  Chest, abdomen,  and pelvic CT revealed multiple soft tissue nodules throughout the pelvis, small bowel mesenteric and peritoneum and, concerning for peritoneal metastatic disease.  There was soft tissue irregularity within the left upper quadrant.  There was mild right hydronephrosis (etiology unclear).  There was interval resolution of previously described right pleural based fluid and gas collection. There was a pleural based nodule within the right lower hemi-thorax concerning for pleural based metastasis.  There were small bilateral pleural effusions.  There was pulmonary nodularity, predominately within the left upper lobe (metastatic or infectious/inflammatory etiology).  There was slightly increased mediastinal adenopathy (infectious/inflammatory or metastatic).  There was a low attenuation lesion within the left hepatic lobe (complicated fluid within the fissure or metastatic disease). 03/08/2016:  Abdomen and pelvic CT revealed mild progression of peritoneal disease in the abdomen.  There was interval increase in loculated fluid around the lateral segment left liver, stomach, and spleen.  There was persistent soft tissue lesion at the level of the ampulla with mild intra and extrahepatic biliary duct dilatation.  There was persistent intrahepatic and capsular metastatic disease involving the liver. 05/26/2016:  Head MRI revealed multiple small areas of acute infarct involving the occipital parietal lobe bilaterally and the left lateral cerebellum,  consistent with posterior circulation emboli.  06/10/2016:  Adomen and pelvic CT revealed progression of peritoneal carcinomatosis predominantly in the perihepatic space, bilateral lower quadrants and pelvis.  There was worsening bilateral obstructive uropathy due to malignant involvement of the pelvic ureters.  There was stable small pleural/subpleural nodules at the right lung base.  There was stable small left pleural effusion.  There was decreased small volume perihepatic  ascites.  08/09/2016:  Abdomen and pelvic CT revealed interval increase and loculated fluid collections within the peritoneal space consistent with progression of peritoneal ovarian carcinoma metastasis.  There was one  large 7 cm collection in the RIGHT lower quadrant which had increased significantly in size and may represent of point of small bowel obstruction.  The proximal stomach and duodenum were decompressed. The mid small bowel was mildly dilated. There was concern for obstruction given the large intraperitoneal fluid collection.  There was increase in subcapsular hepatic metastatic fluid collections.  There was enlargement of rounded ampullary lesion in the second portion duodenum concerning for metastatic lesion.  There was mild biliary duct dilatation (stable).  There was nodular pleural metastasis in the RIGHT lower lobe pleural space. 11/03/2016:  Abdomen and pelvic CT revealed multifocal cystic metastases in the abdomen/pelvis, reflecting peritoneal disease.  The dominant lesion in the left anterior abdomen has progressed, while additional lesions were mixed.  Peritoneal disease along the liver and spleen progressed.  Pleural-based metastases along the posterior right hemithorax progressed. There were trace left pleural effusion.  There was a stable 1.7 cm duodenal lesion at the ampulla. 11/29/2016:  Abdomen and pelvic CT revealed mild interval enlargement of pleural metastasis the RIGHT lower lobe.  There was overall mild decrease in cystic peritoneal metastasis within the abdomen pelvis. The solid lesion at the terminal ileum was decreased in size. Subcapsular lesion in the liver was slightly decreased in size.  There was no evidence of disease progression the abdomen pelvis.  There was mild intrahepatic duct dilatation similar prior.  The ampullary lesion was slightly larger.  There was mild hydronephrosis and hydroureter on the LEFT likely related to cystic metastasis at the LEFT vesicoureteral  junction 12/09/2016:  MRCP revealed the cystic lesion within the pancreatic head/uncinate process had decreased in size and demonstrated no suspicious characteristics. This can be presumed a pseudocyst or focus of side branch duct ectasia.  There was similar to slight progression of extensive peritoneal metastasis since 11/29/2016. Grossly similar abdominal nodal and right pleural metastasis.  There was chronic mild common duct dilatation with similar soft tissue fullness about the ampulla compared to 11/29/2016.  There was improved to resolved left-sided hydronephrosis. 01/20/2017:  Abdomen and pelvic CT revealed complex mixed interval changes in widespread metastatic disease in the peritoneal cavity, lower chest and thoracic, abdominal and pelvic lymph nodes. Right lower chest pleural metastasis and several of the peritoneal tumor implants had increased.  There was new right inguinal lymphadenopathy.  Some of the peritoneal tumor implants had decreased.  The small dependent left pleural effusion was slightly increased .  The mild left hydroureteronephrosis was improved.  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   The chronic biliary ductal dilatation was stable .  There was no evidence of bowel obstruction. 05/22/2017:  Chest, abdomen, and pelvic CT revealed  significant enlargement of metastatic disease involving the right pleura, low paratracheal nodal chain, omentum, peritoneal surfaces, mesentery, and liver. Tumors extending through the ostomy site into the subcutaneous tissues. There were multiple new metastatic liver and right pleural lesions.  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  There was new sclerotic lesion in the T11 vertebral body, concerning for possible metastatic disease. There was healing anterior nondisplaced rib fractures of the right second through ninth ribs that  appeared new. 07/10/2017:  Chest, abdomen and pelvic CT revealed RIGHT lower lobe pleural-based nodules, slightly smaller.  There was stable bilateral pleural effusions (left > right) and stable mediastinal adenopathy.  There was evidence of progression of disease in the abdomen pelvis.  There  was interval increase in size of hepatic lesions most noticeably in the LEFT hepatic lobe.  There was interval increase in bilateral hydronephrosis and hydroureter.  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  There was interval increase in size of large peritoneal and pelvic peritoneal implants.  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  Admissions: [facility] from 06/15/2015 - 06/22/2015.  She underwent exploratory laparotomy, lysis of adhesions, total abdominal hysterectomy with bilateral salpingo-oophorectomy, infracolic omentectomy, optimal tumor debulking(< 1 cm), recto-sigmoid resection with creation of end colostomy, cholecystectomy, mobilization of splenic flexure and liver with diaphragmatic stripping on 06/15/2015. The right diaphragm was cleared of tumor. During dissection, the diaphragm was entered and closed with sutures.  [facility] from 07/01/2015 - 07/03/2015 with pleural effusion. [place] [facility] from 07/07/2015 - 07/09/2015 with recurrent pleural effusion. [facility] from 07/29/2015 - 08/10/2015 with a right-sided empyema and liver abscess. She underwent CT-guided placement of a liver abscess drain on 07/30/2015.  Liver abscess culture grew out group B strep and Enterobacter which was sensitive to Zosyn. She was transitioned to ertapenem Colbert Ewing) prior to discharge.  She was read[facility] to [facility] on 09/17/2015. [place] [facility] from 05/26/2016 - 05/28/2016 with altered mental status and an acute embolic CVA.  Head MRI on 05/26/2016 revealed multiple small areas of acute infarct involving the occipital parietal lobe bilaterally and the left  lateral cerebellum,  consistent with posterior circulation emboli.  Work-up included a negative carotid ultrasound and echocardiogram. [facility] from 08/10/2016 - 08/14/2016 with a small bowel obstruction.  She was managed conservatively. [facility] from 09/27/2016 - 09/28/2016 with symptomatic anemia and diarrhea.   Infusion on 07/31/2017  Component Date Value Ref Range Status  . Magnesium 07/31/2017 1.4* 1.7 - 2.4 mg/dL Final   Performed at [facility], [address] [place], [place] [address]  . Sodium 07/31/2017 135  135 - 145 mmol/L Final  . Potassium 07/31/2017 3.9  3.5 - 5.1 mmol/L Final  . Chloride 07/31/2017 102  101 - 111 mmol/L Final  . CO2 07/31/2017 25  22 - 32 mmol/L Final  . Glucose, Bld 07/31/2017 201* 65 - 99 mg/dL Final  . BUN 07/31/2017 19  6 - 20 mg/dL Final  . Creatinine, Ser 07/31/2017 0.80  0.44 - 1.00 mg/dL Final  . Calcium 07/31/2017 9.2  8.9 - 10.3 mg/dL Final  . GFR calc non Af Amer 07/31/2017 >60  >60 mL/min Final  . GFR calc Af Amer 07/31/2017 >60  >60 mL/min Final   Comment: (NOTE) The eGFR has been calculated using the CKD EPI equation. This calculation has not been validated in all clinical situations. eGFR's persistently <60 mL/min signify possible Chronic Kidney Disease.   Georgiann Hahn gap 07/31/2017 8  5 - 15 Final   Performed at [facility], [address] [place], [place] [address]  . WBC 07/31/2017 3.8  3.6 - 11.0 K/uL Final  . RBC 07/31/2017 3.24* 3.80 - 5.20 MIL/uL Final  . Hemoglobin 07/31/2017 9.4* 12.0 - 16.0 g/dL Final  . HCT 07/31/2017 28.4* 35.0 - 47.0 % Final  . MCV 07/31/2017 87.7  80.0 - 100.0 fL Final  . MCH 07/31/2017 29.2  26.0 - 34.0 pg Final  . MCHC 07/31/2017 33.2  32.0 - 36.0 g/dL Final  . RDW 07/31/2017 17.6* 11.5 - 14.5 % Final  . Platelets 07/31/2017 302  150 - 440 K/uL Final  . Neutrophils Relative % 07/31/2017 67  % Final  . Neutro Abs 07/31/2017  2.5  1.4 - 6.5 K/uL Final  . Lymphocytes Relative 07/31/2017 17  %  Final  . Lymphs Abs 07/31/2017 0.6* 1.0 - 3.6 K/uL Final  . Monocytes Relative 07/31/2017 14  % Final  . Monocytes Absolute 07/31/2017 0.5  0.2 - 0.9 K/uL Final  . Eosinophils Relative 07/31/2017 2  % Final  . Eosinophils Absolute 07/31/2017 0.1  0 - 0.7 K/uL Final  . Basophils Relative 07/31/2017 2  % Final  . Basophils Absolute 07/31/2017 0.1  0 - 0.1 K/uL Final   Performed at [facility], [address], [place], [place] [address]    Assessment:  ARMANDA FORAND is a 72 y.o. female with progressive stage IV ovarian cancer.  She presented with abdominal discomfort and bloating.  Omental biopsy on 02/23/2015 revealed metastatic high grade serous carcinoma, consistent with gynecologic origin.   She was initially diagnosed with clinical stage IIIC (T3cN1Mx).  CA125 was 707 on 02/17/2015.  She received 4 cycles of neoadjuvant carboplatin and Taxol (03/05/2015 - 05/22/2015).  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  She had loose stools on oral magnesium.  She was initially on Neurontin then switched Lyrica with cycle #3.  Cycle #4 was notable for neutropenia (ANC 300) requiring GCSF x 3 days.    She underwent exploratory laparotomy, lysis of adhesions, total abdominal hysterectomy with bilateral salpingo-oophorectomy, infracolic omentectomy, optimal tumor debulking(< 1 cm), recto-sigmoid resection with creation of end colostomy, cholecystectomy, mobilization of splenic flexure and liver with diaphragmatic stripping on 06/15/2015. The right diaphragm was cleared of tumor. During dissection, the diaphragm was entered and closed with sutures.  She received tamoxifen from 02/25/2016 - 03/14/2016.  She received 4 cycles of carboplatin and gemcitabine (03/21/2016 - 05/24/2016) with GCSF/Neulasta support.  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  Cymbalta began on 02/24/2017  PDL-1 testing revealed a combined score was 5 (>=1 positive).     [employer] genetic testing  negative for APC, ATM, BMPR1A, BRCA1, BRCA2, BRIP1, CDH1, CDK4, CDKN2A, CHEK2, EPCAM, MLH1, MSH2, MSH6, MUTYH, NBN, PALB2, PMS2, PTEN, RAD51C, RAD51D, SMAD4, STK11, TP53. Sequencing for select regions of POLE and POLD1, and large rearrangement analysis for select regions of GREM1 were negative.  She received 2 cycles of Doxil (06/21/2016 - 07/19/2016) with Neulasta support.  She received 4 cycles of topotecan with Neulasta support (08/22/2016 - 10/10/2016).    She received 3 cycles of pembrolizumab (Keytruda) from 12/06/2016 -  01/09/2017.  Cycle #1 was complicated by neutropenia (ANC 400) requiring GCSF/Granix x 3 days.  She received GCSF x 5 days with cycle #2.  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 Right lower chest pleural metastasis and several of the peritoneal tumor implants had increased.  There was new right inguinal lymphadenopathy.  Some of the peritoneal tumor implants had decreased.  The small dependent left pleural effusion was slightly increased .  The mild left hydroureteronephrosis was improved.  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   The chronic biliary ductal dilatation was stable .  There was no evidence of bowel obstruction.  CA125 was 802.9 on 03/30/2015, 567.9 on 04/13/2015, 168.8 on 05/15/2015, 85.2 on 07/17/2015, 68.6 on 07/28/2015, 34.5 on 09/17/2015, 20.7 on 11/06/2015, 49 on 01/22/2016, 106.1 on 02/22/2016, 138.8 on 03/07/2016, 220.8 on 03/21/2016, 152.8 on 04/11/2016, 125.6 on 04/18/2016, 76.8 on 05/03/2016, 60.2 on 05/24/2016, 44 on  07/19/2016, 65.8 on 08/17/2016, 53.4 on 09/12/2016, 47.3 on 10/10/2016, 91 on 12/20/2016, 141.1 on 01/09/2017, 463.7 on 02/27/2017, 907.1 on 03/27/2017, 192.1 on 05/22/2017, 189.1 on 05/24/2017, and 144.3 on 06/19/2017.  She has a history of recurrent right sided pleural effusion.  She  underwent thoracentesis of 650 cc on post-operative day 3.  She was admitted to [facility] on 07/01/2015 and 07/07/2015 for recurrent shortness of breath.  She underwent 2 additional thoracenteses (1.1 L on 07/02/2015 and 850 cc on 07/08/2015).  Cytology was negative x 2.  Bilateral lower extremity duplex on 07/03/2015 was negative.  Echo revealed an EF of 55-60% on 07/08/2015 and 60-65% on 05/27/2016.    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 She was on Lovenox 60 mg twice a day.  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  She has severe rheumatoid arthritis.  Methotrexate and Enbrel were initially on hold.  She has a normocytic anemia.  Work-up on 02/17/2015 and 05/24/2016 revealed a normal ferritin, B12, folate, TSH.  She denies any melena or hematochezia.    She has anemia due to chronic disease. She received 1 unit PRBCs during her admission at [facility]. She denies any melena or hematochezia. She has diabetes and is on an insulin pump.  She has had persistent neutropenia felt secondary to her rheumatoid arthritis.  Folate and MMA were normal.  TSH was 6.13 (high) with a free T4 of 1.17 (0.61-1.12).  She began methotrexate (10 mg a week) and prednisone (5 mg a day) for severe rheumatoid arthritis on 12/17/2015.  She remains on prednisone alone (5 mg a day).  Bone marrow aspirate and biopsy on 06/09/2010 revealed a hypercellular marrow (70%) with no evidence of dysplasia or malignancy.  Flow cytometry was negative.  Cytogenetics were normal (46,XX).  FISH studies were negative for MDS.  Bone marrow aspirate and biopsy on 12/03/2015 revealed a normocellular to mildy hypercellular marrow for age (40%) with left shifted myelopoiesis, non specific dyserythropoiesis and mild megakaryocytic atypia with no increase in  blasts.  There were multiple small nonspecific lymphoid aggregates (favor reactive). There was no increase in reticulin.  There was decreased myeloid cells (37%) with left shifted maturation and 1% atypical myelod blasts.  There was relatively increased monocytic cells (11%), relatively increased lymphoid cells (36%), and relatively increased eosinophils (6%).  Cytogenetics were normal (9, XX).  SNP microarray was normal.  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  She receives IV magnesium twice weekly.    She has chemotherapy induced anemia.  She has received Procrit (last 06/26/2017).  She received 1 unit of PRBCs on 06/05/2017.  Labs on 08/22/2016 revealed a normal B12 and folate.  Ferritin was 201, iron saturation 21% and TIBC 307 on 06/09/2017.  B12 and folate were normal.  Code status is DNR/DNI.  She developed acute LFT elevation on 11/28/2016.  Event was preceded by upper abdominal discomfort.  Hepatitis B core antibody total and hepatitis C antibody were negative.  She received 5 cycles of Abraxane (03/13/2017 - 06/26/2017).  Cycle #1 was truncated after day 1 secondary to side effects and Thanksgiving.  Chest, abdomen and pelvic CT on 07/10/2017 revealed progression of disease in the abdomen pelvis.  There was interval increase in size of hepatic lesions most noticeably in the LEFT hepatic lobe.  There was interval increase in  bilateral hydronephrosis and hydroureter.  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  There was interval increase in size of large peritoneal and pelvic peritoneal implants.  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  She underwent bilateral ureteral stent placement on 07/13/2017.  [facility].  Symptomatically, she has been doing well overall. She is eating "ok". Weight is down 7 pounds. Gross hematuria following stent  replacement has improved. Exam is stable.  Magnesium 1.4.  Plan: 1.  Labs today: CBC with diff, BMP, Mg. 2.  Discuss low magnesium of 1.4. Will replace with 6 grams of IV magnesium today.  3.  Discuss anemia. Hemoglobin stable. No transfusion needed at this time. 4.  Discuss gross hematuria. Patient to continue to follow up with Dr. Erlene Quan.  5.  RTC on Mondays and Fridays (CBC with diff, Mg) and +/- IV Mg 6.  RTC on 08/28/2017 for MD assessment and labs (CBC with diff, BMP, Mg) and IV Mg.    Honor Loh, NP  07/31/17, 9:43 AM   I saw and evaluated the patient, participating in the key portions of the service and reviewing pertinent diagnostic studies and records.  I reviewed the nurse practitioner's note and agree with the findings and the plan.  The assessment and plan were discussed with the patient.  Several questions were asked by the patient and answered.   Nolon Stalls, MD 07/31/2017, 9:43 AM

## 2017-07-31 NOTE — Progress Notes (Signed)
Patient states yesterday afternoon she had an episode where she felt like her eyes would not focus.  She states "it felt like one was going one way and the other one was going another".  States it was a very short period of time.  Did not have any nausea or dizziness.

## 2017-08-04 ENCOUNTER — Other Ambulatory Visit: Payer: Self-pay

## 2017-08-04 ENCOUNTER — Other Ambulatory Visit: Payer: Self-pay | Admitting: Urgent Care

## 2017-08-04 ENCOUNTER — Other Ambulatory Visit: Payer: Medicare Other

## 2017-08-04 ENCOUNTER — Inpatient Hospital Stay

## 2017-08-04 DIAGNOSIS — C800 Disseminated malignant neoplasm, unspecified: Principal | ICD-10-CM

## 2017-08-04 DIAGNOSIS — C561 Malignant neoplasm of right ovary: Secondary | ICD-10-CM | POA: Diagnosis not present

## 2017-08-04 LAB — CBC WITH DIFFERENTIAL/PLATELET
Basophils Absolute: 0.1 10*3/uL (ref 0–0.1)
Basophils Relative: 2 %
Eosinophils Absolute: 0 10*3/uL (ref 0–0.7)
Eosinophils Relative: 1 %
HCT: 28.4 % — ABNORMAL LOW (ref 35.0–47.0)
Hemoglobin: 9.6 g/dL — ABNORMAL LOW (ref 12.0–16.0)
Lymphocytes Relative: 19 %
Lymphs Abs: 0.7 10*3/uL — ABNORMAL LOW (ref 1.0–3.6)
MCH: 29.5 pg (ref 26.0–34.0)
MCHC: 33.7 g/dL (ref 32.0–36.0)
MCV: 87.5 fL (ref 80.0–100.0)
Monocytes Absolute: 0.5 10*3/uL (ref 0.2–0.9)
Monocytes Relative: 15 %
Neutro Abs: 2.3 10*3/uL (ref 1.4–6.5)
Neutrophils Relative %: 63 %
Platelets: 261 10*3/uL (ref 150–440)
RBC: 3.24 MIL/uL — ABNORMAL LOW (ref 3.80–5.20)
RDW: 17.4 % — ABNORMAL HIGH (ref 11.5–14.5)
WBC: 3.6 10*3/uL (ref 3.6–11.0)

## 2017-08-04 LAB — MAGNESIUM: Magnesium: 1.3 mg/dL — ABNORMAL LOW (ref 1.7–2.4)

## 2017-08-04 MED ORDER — SODIUM CHLORIDE 0.9 % IV SOLN
INTRAVENOUS | Status: DC
  Administered 2017-08-04: 10:00:00 via INTRAVENOUS
  Filled 2017-08-04: qty 1000

## 2017-08-04 MED ORDER — HEPARIN SOD (PORK) LOCK FLUSH 100 UNIT/ML IV SOLN
INTRAVENOUS | Status: AC
  Filled 2017-08-04: qty 5

## 2017-08-04 MED ORDER — HEPARIN SOD (PORK) LOCK FLUSH 100 UNIT/ML IV SOLN
500.0000 [IU] | Freq: Once | INTRAVENOUS | Status: AC
  Administered 2017-08-04: 500 [IU] via INTRAVENOUS

## 2017-08-04 MED ORDER — SODIUM CHLORIDE 0.9 % IV SOLN
6.0000 g | Freq: Once | INTRAVENOUS | Status: AC
  Administered 2017-08-04: 6 g via INTRAVENOUS
  Filled 2017-08-04: qty 10

## 2017-08-07 ENCOUNTER — Inpatient Hospital Stay

## 2017-08-07 VITALS — BP 134/78 | HR 83 | Temp 96.7°F | Resp 18

## 2017-08-07 DIAGNOSIS — C561 Malignant neoplasm of right ovary: Secondary | ICD-10-CM

## 2017-08-07 DIAGNOSIS — C800 Disseminated malignant neoplasm, unspecified: Principal | ICD-10-CM

## 2017-08-07 LAB — CBC WITH DIFFERENTIAL/PLATELET
Basophils Absolute: 0 K/uL (ref 0–0.1)
Basophils Relative: 1 %
Eosinophils Absolute: 0 K/uL (ref 0–0.7)
Eosinophils Relative: 1 %
HCT: 27 % — ABNORMAL LOW (ref 35.0–47.0)
Hemoglobin: 9.1 g/dL — ABNORMAL LOW (ref 12.0–16.0)
Lymphocytes Relative: 22 %
Lymphs Abs: 0.7 K/uL — ABNORMAL LOW (ref 1.0–3.6)
MCH: 29 pg (ref 26.0–34.0)
MCHC: 33.7 g/dL (ref 32.0–36.0)
MCV: 86.1 fL (ref 80.0–100.0)
Monocytes Absolute: 0.4 K/uL (ref 0.2–0.9)
Monocytes Relative: 14 %
Neutro Abs: 1.9 K/uL (ref 1.4–6.5)
Neutrophils Relative %: 62 %
Platelets: 247 K/uL (ref 150–440)
RBC: 3.14 MIL/uL — ABNORMAL LOW (ref 3.80–5.20)
RDW: 17 % — ABNORMAL HIGH (ref 11.5–14.5)
WBC: 3 K/uL — ABNORMAL LOW (ref 3.6–11.0)

## 2017-08-07 LAB — MAGNESIUM: Magnesium: 1.4 mg/dL — ABNORMAL LOW (ref 1.7–2.4)

## 2017-08-07 MED ORDER — HEPARIN SOD (PORK) LOCK FLUSH 100 UNIT/ML IV SOLN
INTRAVENOUS | Status: AC
  Filled 2017-08-07: qty 5

## 2017-08-07 MED ORDER — HEPARIN SOD (PORK) LOCK FLUSH 100 UNIT/ML IV SOLN
500.0000 [IU] | Freq: Once | INTRAVENOUS | Status: AC
  Administered 2017-08-07: 500 [IU] via INTRAVENOUS

## 2017-08-07 MED ORDER — SODIUM CHLORIDE 0.9% FLUSH
10.0000 mL | INTRAVENOUS | Status: DC | PRN
  Administered 2017-08-07: 10 mL via INTRAVENOUS
  Filled 2017-08-07: qty 10

## 2017-08-07 MED ORDER — SODIUM CHLORIDE 0.9 % IV SOLN
6.0000 g | Freq: Once | INTRAVENOUS | Status: AC
  Administered 2017-08-07: 6 g via INTRAVENOUS
  Filled 2017-08-07: qty 10

## 2017-08-07 MED ORDER — SODIUM CHLORIDE 0.9 % IV SOLN
Freq: Once | INTRAVENOUS | Status: AC
  Administered 2017-08-07: 09:00:00 via INTRAVENOUS
  Filled 2017-08-07: qty 1000

## 2017-08-11 ENCOUNTER — Telehealth: Payer: Self-pay | Admitting: *Deleted

## 2017-08-11 ENCOUNTER — Inpatient Hospital Stay

## 2017-08-11 DIAGNOSIS — C561 Malignant neoplasm of right ovary: Secondary | ICD-10-CM | POA: Diagnosis not present

## 2017-08-11 DIAGNOSIS — C800 Disseminated malignant neoplasm, unspecified: Principal | ICD-10-CM

## 2017-08-11 LAB — CBC WITH DIFFERENTIAL/PLATELET
Basophils Absolute: 0.1 10*3/uL (ref 0–0.1)
Basophils Relative: 2 %
Eosinophils Absolute: 0 10*3/uL (ref 0–0.7)
Eosinophils Relative: 1 %
HCT: 27.2 % — ABNORMAL LOW (ref 35.0–47.0)
Hemoglobin: 9.1 g/dL — ABNORMAL LOW (ref 12.0–16.0)
Lymphocytes Relative: 21 %
Lymphs Abs: 0.5 10*3/uL — ABNORMAL LOW (ref 1.0–3.6)
MCH: 28.6 pg (ref 26.0–34.0)
MCHC: 33.4 g/dL (ref 32.0–36.0)
MCV: 85.5 fL (ref 80.0–100.0)
Monocytes Absolute: 0.4 10*3/uL (ref 0.2–0.9)
Monocytes Relative: 15 %
Neutro Abs: 1.5 10*3/uL (ref 1.4–6.5)
Neutrophils Relative %: 61 %
Platelets: 230 10*3/uL (ref 150–440)
RBC: 3.18 MIL/uL — ABNORMAL LOW (ref 3.80–5.20)
RDW: 16.4 % — ABNORMAL HIGH (ref 11.5–14.5)
WBC: 2.5 10*3/uL — ABNORMAL LOW (ref 3.6–11.0)

## 2017-08-11 LAB — MAGNESIUM: Magnesium: 1.3 mg/dL — ABNORMAL LOW (ref 1.7–2.4)

## 2017-08-11 MED ORDER — SODIUM CHLORIDE 0.9 % IV SOLN
6.0000 g | Freq: Once | INTRAVENOUS | Status: AC
  Administered 2017-08-11: 6 g via INTRAVENOUS
  Filled 2017-08-11: qty 10

## 2017-08-11 MED ORDER — HEPARIN SOD (PORK) LOCK FLUSH 100 UNIT/ML IV SOLN
500.0000 [IU] | Freq: Once | INTRAVENOUS | Status: AC
  Administered 2017-08-11: 500 [IU] via INTRAVENOUS
  Filled 2017-08-11: qty 5

## 2017-08-11 NOTE — Telephone Encounter (Signed)
Spoke with patient in infusion to let her know WBC's are off.  She should report any fever.  Patient verbalized understanding.

## 2017-08-14 ENCOUNTER — Inpatient Hospital Stay

## 2017-08-14 DIAGNOSIS — C561 Malignant neoplasm of right ovary: Secondary | ICD-10-CM | POA: Diagnosis not present

## 2017-08-14 DIAGNOSIS — C800 Disseminated malignant neoplasm, unspecified: Principal | ICD-10-CM

## 2017-08-14 LAB — CBC WITH DIFFERENTIAL/PLATELET
Basophils Absolute: 0 10*3/uL (ref 0–0.1)
Basophils Relative: 1 %
Eosinophils Absolute: 0 10*3/uL (ref 0–0.7)
Eosinophils Relative: 1 %
HCT: 27.6 % — ABNORMAL LOW (ref 35.0–47.0)
Hemoglobin: 9.1 g/dL — ABNORMAL LOW (ref 12.0–16.0)
Lymphocytes Relative: 25 %
Lymphs Abs: 0.7 10*3/uL — ABNORMAL LOW (ref 1.0–3.6)
MCH: 28.3 pg (ref 26.0–34.0)
MCHC: 33.1 g/dL (ref 32.0–36.0)
MCV: 85.6 fL (ref 80.0–100.0)
Monocytes Absolute: 0.4 10*3/uL (ref 0.2–0.9)
Monocytes Relative: 14 %
Neutro Abs: 1.6 10*3/uL (ref 1.4–6.5)
Neutrophils Relative %: 59 %
Platelets: 241 10*3/uL (ref 150–440)
RBC: 3.23 MIL/uL — ABNORMAL LOW (ref 3.80–5.20)
RDW: 16.7 % — ABNORMAL HIGH (ref 11.5–14.5)
WBC: 2.7 10*3/uL — ABNORMAL LOW (ref 3.6–11.0)

## 2017-08-14 LAB — MAGNESIUM: Magnesium: 1.3 mg/dL — ABNORMAL LOW (ref 1.7–2.4)

## 2017-08-14 MED ORDER — HEPARIN SOD (PORK) LOCK FLUSH 100 UNIT/ML IV SOLN
250.0000 [IU] | Freq: Once | INTRAVENOUS | Status: AC | PRN
  Administered 2017-08-14: 500 [IU]

## 2017-08-14 MED ORDER — SODIUM CHLORIDE 0.9 % IV SOLN
6.0000 g | Freq: Once | INTRAVENOUS | Status: AC
  Administered 2017-08-14: 6 g via INTRAVENOUS
  Filled 2017-08-14: qty 10

## 2017-08-18 ENCOUNTER — Inpatient Hospital Stay

## 2017-08-18 DIAGNOSIS — C561 Malignant neoplasm of right ovary: Secondary | ICD-10-CM | POA: Diagnosis not present

## 2017-08-18 DIAGNOSIS — C800 Disseminated malignant neoplasm, unspecified: Principal | ICD-10-CM

## 2017-08-18 LAB — CBC WITH DIFFERENTIAL/PLATELET
Basophils Absolute: 0 10*3/uL (ref 0–0.1)
Basophils Relative: 1 %
Eosinophils Absolute: 0 10*3/uL (ref 0–0.7)
Eosinophils Relative: 1 %
HCT: 29.6 % — ABNORMAL LOW (ref 35.0–47.0)
Hemoglobin: 9.7 g/dL — ABNORMAL LOW (ref 12.0–16.0)
Lymphocytes Relative: 30 %
Lymphs Abs: 0.7 10*3/uL — ABNORMAL LOW (ref 1.0–3.6)
MCH: 27.9 pg (ref 26.0–34.0)
MCHC: 32.8 g/dL (ref 32.0–36.0)
MCV: 85 fL (ref 80.0–100.0)
Monocytes Absolute: 0.4 10*3/uL (ref 0.2–0.9)
Monocytes Relative: 15 %
Neutro Abs: 1.3 10*3/uL — ABNORMAL LOW (ref 1.4–6.5)
Neutrophils Relative %: 53 %
Platelets: 274 10*3/uL (ref 150–440)
RBC: 3.49 MIL/uL — ABNORMAL LOW (ref 3.80–5.20)
RDW: 16.4 % — ABNORMAL HIGH (ref 11.5–14.5)
WBC: 2.5 10*3/uL — ABNORMAL LOW (ref 3.6–11.0)

## 2017-08-18 LAB — BASIC METABOLIC PANEL
Anion gap: 7 (ref 5–15)
BUN: 16 mg/dL (ref 6–20)
CO2: 27 mmol/L (ref 22–32)
Calcium: 9.3 mg/dL (ref 8.9–10.3)
Chloride: 100 mmol/L — ABNORMAL LOW (ref 101–111)
Creatinine, Ser: 0.77 mg/dL (ref 0.44–1.00)
GFR calc Af Amer: >60 mL/min (ref >60)
GFR calc non Af Amer: >60 mL/min (ref >60)
Glucose, Bld: 196 mg/dL — ABNORMAL HIGH (ref 65–99)
Potassium: 3.8 mmol/L (ref 3.5–5.1)
Sodium: 134 mmol/L — ABNORMAL LOW (ref 135–145)

## 2017-08-18 LAB — MAGNESIUM: Magnesium: 1.4 mg/dL — ABNORMAL LOW (ref 1.7–2.4)

## 2017-08-18 MED ORDER — HEPARIN SOD (PORK) LOCK FLUSH 100 UNIT/ML IV SOLN
500.0000 [IU] | Freq: Once | INTRAVENOUS | Status: AC
  Administered 2017-08-18: 500 [IU] via INTRAVENOUS
  Filled 2017-08-18: qty 5

## 2017-08-18 MED ORDER — SODIUM CHLORIDE 0.9 % IV SOLN
6.0000 g | Freq: Once | INTRAVENOUS | Status: AC
  Administered 2017-08-18: 6 g via INTRAVENOUS
  Filled 2017-08-18: qty 12

## 2017-08-18 MED ORDER — SODIUM CHLORIDE 0.9 % IV SOLN
INTRAVENOUS | Status: DC
  Administered 2017-08-18: 09:00:00 via INTRAVENOUS
  Filled 2017-08-18: qty 1000

## 2017-08-21 ENCOUNTER — Other Ambulatory Visit: Payer: Self-pay | Admitting: Urgent Care

## 2017-08-21 ENCOUNTER — Inpatient Hospital Stay

## 2017-08-21 VITALS — BP 128/70 | HR 84 | Temp 98.1°F | Resp 18

## 2017-08-21 DIAGNOSIS — C800 Disseminated malignant neoplasm, unspecified: Principal | ICD-10-CM

## 2017-08-21 DIAGNOSIS — C561 Malignant neoplasm of right ovary: Secondary | ICD-10-CM

## 2017-08-21 DIAGNOSIS — Z5111 Encounter for antineoplastic chemotherapy: Secondary | ICD-10-CM

## 2017-08-21 LAB — CBC WITH DIFFERENTIAL/PLATELET
Basophils Absolute: 0 10*3/uL (ref 0–0.1)
Basophils Relative: 1 %
Eosinophils Absolute: 0 10*3/uL (ref 0–0.7)
Eosinophils Relative: 1 %
HCT: 28.4 % — ABNORMAL LOW (ref 35.0–47.0)
Hemoglobin: 9.5 g/dL — ABNORMAL LOW (ref 12.0–16.0)
Lymphocytes Relative: 26 %
Lymphs Abs: 0.6 10*3/uL — ABNORMAL LOW (ref 1.0–3.6)
MCH: 28 pg (ref 26.0–34.0)
MCHC: 33.5 g/dL (ref 32.0–36.0)
MCV: 83.4 fL (ref 80.0–100.0)
Monocytes Absolute: 0.4 10*3/uL (ref 0.2–0.9)
Monocytes Relative: 17 %
Neutro Abs: 1.3 10*3/uL — ABNORMAL LOW (ref 1.4–6.5)
Neutrophils Relative %: 55 %
Platelets: 255 10*3/uL (ref 150–440)
RBC: 3.41 MIL/uL — ABNORMAL LOW (ref 3.80–5.20)
RDW: 16.2 % — ABNORMAL HIGH (ref 11.5–14.5)
WBC: 2.5 10*3/uL — ABNORMAL LOW (ref 3.6–11.0)

## 2017-08-21 LAB — MAGNESIUM: Magnesium: 1.3 mg/dL — ABNORMAL LOW (ref 1.7–2.4)

## 2017-08-21 MED ORDER — HEPARIN SOD (PORK) LOCK FLUSH 100 UNIT/ML IV SOLN
500.0000 [IU] | Freq: Once | INTRAVENOUS | Status: AC
  Administered 2017-08-21: 500 [IU] via INTRAVENOUS
  Filled 2017-08-21: qty 5

## 2017-08-21 MED ORDER — SODIUM CHLORIDE 0.9 % IV SOLN
6.0000 g | Freq: Once | INTRAVENOUS | Status: AC
  Administered 2017-08-21: 6 g via INTRAVENOUS
  Filled 2017-08-21: qty 10

## 2017-08-21 MED ORDER — SODIUM CHLORIDE 0.9% FLUSH
10.0000 mL | INTRAVENOUS | Status: DC | PRN
  Administered 2017-08-21: 10 mL via INTRAVENOUS
  Filled 2017-08-21: qty 10

## 2017-08-21 MED ORDER — SODIUM CHLORIDE 0.9 % IV SOLN: 6.0000 g | Freq: Once | INTRAVENOUS | Status: DC

## 2017-08-21 MED ORDER — SODIUM CHLORIDE 0.9 % IV SOLN
INTRAVENOUS | Status: DC
  Administered 2017-08-21: 09:00:00 via INTRAVENOUS
  Filled 2017-08-21: qty 1000

## 2017-08-23 ENCOUNTER — Ambulatory Visit: Payer: Medicare Other

## 2017-08-25 ENCOUNTER — Inpatient Hospital Stay

## 2017-08-25 ENCOUNTER — Other Ambulatory Visit: Payer: Self-pay | Admitting: *Deleted

## 2017-08-25 DIAGNOSIS — C561 Malignant neoplasm of right ovary: Secondary | ICD-10-CM | POA: Diagnosis not present

## 2017-08-25 DIAGNOSIS — C786 Secondary malignant neoplasm of retroperitoneum and peritoneum: Secondary | ICD-10-CM

## 2017-08-25 DIAGNOSIS — C801 Malignant (primary) neoplasm, unspecified: Principal | ICD-10-CM

## 2017-08-25 DIAGNOSIS — C800 Disseminated malignant neoplasm, unspecified: Principal | ICD-10-CM

## 2017-08-25 LAB — CBC WITH DIFFERENTIAL/PLATELET
Basophils Absolute: 0 10*3/uL (ref 0–0.1)
Basophils Relative: 1 %
Eosinophils Absolute: 0 10*3/uL (ref 0–0.7)
Eosinophils Relative: 1 %
HCT: 28.1 % — ABNORMAL LOW (ref 35.0–47.0)
Hemoglobin: 9.4 g/dL — ABNORMAL LOW (ref 12.0–16.0)
Lymphocytes Relative: 28 %
Lymphs Abs: 0.6 10*3/uL — ABNORMAL LOW (ref 1.0–3.6)
MCH: 27.9 pg (ref 26.0–34.0)
MCHC: 33.3 g/dL (ref 32.0–36.0)
MCV: 83.7 fL (ref 80.0–100.0)
Monocytes Absolute: 0.4 10*3/uL (ref 0.2–0.9)
Monocytes Relative: 18 %
Neutro Abs: 1.2 10*3/uL — ABNORMAL LOW (ref 1.4–6.5)
Neutrophils Relative %: 52 %
Platelets: 238 10*3/uL (ref 150–440)
RBC: 3.36 MIL/uL — ABNORMAL LOW (ref 3.80–5.20)
RDW: 16.6 % — ABNORMAL HIGH (ref 11.5–14.5)
WBC: 2.2 10*3/uL — ABNORMAL LOW (ref 3.6–11.0)

## 2017-08-25 LAB — BASIC METABOLIC PANEL
Anion gap: 7 (ref 5–15)
BUN: 20 mg/dL (ref 6–20)
CO2: 27 mmol/L (ref 22–32)
Calcium: 9.2 mg/dL (ref 8.9–10.3)
Chloride: 102 mmol/L (ref 101–111)
Creatinine, Ser: 0.68 mg/dL (ref 0.44–1.00)
GFR calc Af Amer: >60 mL/min (ref >60)
GFR calc non Af Amer: >60 mL/min (ref >60)
Glucose, Bld: 281 mg/dL — ABNORMAL HIGH (ref 65–99)
Potassium: 4.2 mmol/L (ref 3.5–5.1)
Sodium: 136 mmol/L (ref 135–145)

## 2017-08-25 LAB — MAGNESIUM: Magnesium: 1.3 mg/dL — ABNORMAL LOW (ref 1.7–2.4)

## 2017-08-25 MED ORDER — HEPARIN SOD (PORK) LOCK FLUSH 100 UNIT/ML IV SOLN
500.0000 [IU] | Freq: Once | INTRAVENOUS | Status: AC
  Administered 2017-08-25: 500 [IU] via INTRAVENOUS

## 2017-08-25 MED ORDER — SODIUM CHLORIDE 0.9 % IV SOLN
6.0000 g | Freq: Once | INTRAVENOUS | Status: AC
  Administered 2017-08-25: 6 g via INTRAVENOUS
  Filled 2017-08-25: qty 10

## 2017-08-25 MED ORDER — SODIUM CHLORIDE 0.9 % IV SOLN
INTRAVENOUS | Status: DC
  Administered 2017-08-25: 09:00:00 via INTRAVENOUS
  Filled 2017-08-25: qty 1000

## 2017-08-26 NOTE — Progress Notes (Signed)
[facility] day:  08/28/2017   Chief Complaint: [name] is a 72 y.o. female with stage IV ovarian cancer who is seen for 4 week assessment.  HPI:  The patient was last seen in the medical oncology clinic on 07/31/2017.  At that time, patient had been doing well. She was in high spirits (talkative, smiling, and laughing). Patient maintained good quality of life off treatment. She had been able to go on outings with her sisters without experiencing significant fatigue. Complained of a slight headache with transient visual change on 07/30/2017 that resolved without intervention. Gross hematuria with intermittent dysuria following stent removal. Neuropathy was stable. She had lost 7 pounds. Exam was stable. Magnesium low at 1.4; replaced with 6 g in infusion.   Patient has returned to the clinic on Mondays and Fridays since her last visit for labs and magnesium replacement. She has required 6 grams of intravenous magnesium due to chronic HYPOmagnesium that has been refractory to replacement.  During the interim, she has done well.  She feels good.  She denies any abdominal complaints or shortness of breath.  Energy level is good.  She is eating well.  She denies any nausea or vomiting.  She denies any change in bowel movements.  Patient continues to be monitoring at home by multidisciplinary hospice team.    Past Medical History:  Diagnosis Date  . Anemia   . Arthritis   . CAD (coronary artery disease)    a. 09/2012 Cath: LM nl, LAD 95p, 14m LCX 949mOM2 50, RCA 100.  . Marland KitchenHF (congestive heart failure) ([facility]  . Cholelithiasis   . Collagen vascular disease (HCC)    Rheumatoid Arthritis.  . Depression   . Environmental and seasonal allergies   . Esophageal stricture   . Exertional shortness of breath   . GERD (gastroesophageal reflux disease)   . H/O hiatal hernia   . Herniated disc   . History of pancytopenia   . Hyperlipidemia   .  Hypertension   . Hypokalemia   . Leukopenia 2012   s/p bone marrow biopsy, Dr. PaMa Hillock. Neuropathy   . NSTEMI (non-ST elevated myocardial infarction) ([facility]/2014   "mild" (10/18/2012)  . Ovarian cancer ([facility]   chemo and hysterectomy  . Pneumonia 2013; 08/2012   "one lung; double" (10/18/2012)  . Rheumatoid arthritis(714.0)   . Stroke ([facility]  . Type I diabetes mellitus ([facility]   "dx'd in 1957" (10/18/2012)  . Ureter obstruction     Past Surgical History:  Procedure Laterality Date  . ABDOMINAL HYSTERECTOMY  06/15/2015   Dx L/S, EXLAP TAH BSO omentectomy RSRx colostomy diaphragm resection stripping  . CARDIAC CATHETERIZATION  10/18/2012   "first one was today" (10/18/2012)  . CATARACT EXTRACTION W/ INTRAOCULAR LENS IMPLANT Right 2010  . CHOLECYSTECTOMY  06/15/2015   combined case with ovarian cancer debulking  . COLON SURGERY    . CORONARY ARTERY BYPASS GRAFT N/A 10/19/2012   Procedure: CORONARY ARTERY BYPASS GRAFTING (CABG);  Surgeon: StMelrose NakayamaMD;  [facility] Service: Open [facility] Surgery;  Laterality: N/A;  . CYSTOSCOPY W/ RETROGRADES Bilateral 07/13/2017   Procedure: CYSTOSCOPY WITH RETROGRADE PYELOGRAM;  Surgeon: BrHollice EspyMD;  Location: [facility];  Service: Urology;  Laterality: Bilateral;  . CYSTOSCOPY WITH STENT PLACEMENT Bilateral 07/13/2017   Procedure: CYSTOSCOPY WITH STENT PLACEMENT;  Surgeon: BrHollice EspyMD;  Location: [facility] OR[facility];  Service: Urology;  Laterality:  Bilateral;  . ESOPHAGEAL DILATION     "3 or 4 times" (10/19/2011)  . ESOPHAGOGASTRODUODENOSCOPY  2012   Dr. Minna Merritts  . OSTOMY    . OVARY SURGERY     removal  . PERIPHERAL VASCULAR CATHETERIZATION N/A 03/02/2015   Procedure: Glori Luis Cath Insertion;  Surgeon: Algernon Huxley, MD;  Location: [facility];  Service: Cardiovascular;  Laterality: N/A;  . TUBAL LIGATION  1970    Family History  Problem Relation Age of Onset  . Diabetes Mother   . Arthritis Mother   . Diabetes Father    . Arthritis Father   . Aneurysm Sister        neck  . Cancer Sister        lung    Social History:  reports that she quit smoking about 23 years ago. Her smoking use included cigarettes. She has a 30.00 pack-year smoking history. She has never used smokeless tobacco. She reports that she does not drink alcohol or use drugs.  Patient is a retired Art therapist. Patient denies exposure to radiation and toxins. She spends every Thursday afternoon with her sister.  Her son is [name] and daughter is [name].  She is accompanied by her husband, [name], today.  Allergies:  Allergies  Allergen Reactions  . Codeine Nausea And Vomiting  . Latex Rash    Current Medications: Current Outpatient Medications  Medication Sig Dispense Refill  . Calcium-Vitamin D 600-200 MG-UNIT tablet Take 1 tablet by mouth 2 (two) times daily.     . cholecalciferol (VITAMIN D) 1000 units tablet Take 1,000 Units by mouth daily.    . DULoxetine (CYMBALTA) 20 MG capsule Take 1 capsule (20 mg total) by mouth daily. 90 capsule 2  . enoxaparin (LOVENOX) 60 MG/0.6ML injection INJECT 0.6ML (60MG TOTAL) UNDER THE SKINEVERY 12 HOURS 36 mL 1  . folic acid (FOLVITE) 1 MG tablet Take 1 tablet (1 mg total) by mouth daily. 90 tablet 3  . insulin lispro (HUMALOG) 100 UNIT/ML injection Inject 0.06 mLs (6 Units total) into the skin daily. (Patient taking differently: Inject 0-85 Units into the skin daily. Via insulin pump) 30 mL 3  . loratadine (CLARITIN) 10 MG tablet Take 1 tablet by mouth daily as needed.    . magnesium oxide (MAG-OX) 400 (241.3 Mg) MG tablet TAKE 1 TABLET BY MOUTH ONCE DAILY 30 tablet 1  . metoprolol tartrate (LOPRESSOR) 25 MG tablet Take 0.5 tablets (12.5 mg total) by mouth daily after breakfast. 45 tablet 3  . pantoprazole (PROTONIX) 40 MG tablet TAKE 1 TABLET BY MOUTH TWICE (2) DAILY 180 tablet 1  . potassium chloride SA (K-DUR,KLOR-CON) 20 MEQ tablet TAKE 1 TABLET BY MOUTH ONCE A DAY 90 tablet 1  . predniSONE  (DELTASONE) 5 MG tablet Take 5 mg by mouth daily with breakfast.    . vitamin C (ASCORBIC ACID) 500 MG tablet Take 500 mg by mouth daily.    Marland Kitchen zinc gluconate 50 MG tablet Take 50 mg daily by mouth.    . furosemide (LASIX) 40 MG tablet Take 1 tablet (40 mg total) by mouth daily as needed for edema. (Patient not taking: Reported on 08/28/2017) 30 tablet 1  . lidocaine-prilocaine (EMLA) cream APPLY 1 APPLICATION TOPICALLY AS NEEDED 1 HOUR PRIOR TO TREATMENT. COVER WITH PRESS N SEAL UNTIL TREATMENT TIME AS DIRECTED (Patient not taking: Reported on 08/28/2017) 30 g 1  . ondansetron (ZOFRAN) 8 MG tablet TAKE 1 TABLET BY MOUTH EVERY 8 HOURS AS NEEDED FOR  NAUSEA AND VOMITING (Patient not taking: Reported on 08/28/2017) 30 tablet 1  . oxybutynin (DITROPAN) 5 MG tablet Take 1 tablet (5 mg total) by mouth every 8 (eight) hours as needed for bladder spasms. (Patient not taking: Reported on 08/28/2017) 30 tablet 2   No current facility-administered medications for this visit.    Facility-Administered Medications Ordered in Other Visits  Medication Dose Route Frequency Provider Last Rate Last Dose  . 0.9 %  sodium chloride infusion   Intravenous Continuous Lequita Asal, MD   Stopped at 04/24/17 1140  . magnesium sulfate IVPB 4 g 100 mL  4 g Intravenous Once Faythe Casa E, NP      . sodium chloride 0.9 % injection 10 mL  10 mL Intracatheter PRN Corcoran, Melissa C, MD      . sodium chloride 0.9 % injection 10 mL  10 mL Intravenous PRN Lequita Asal, MD   10 mL at 04/13/15 3382    Review of Systems:  GENERAL:  Feels good.  No fevers, sweats or weight loss.  Weight up 3 pounds. PERFORMANCE STATUS (ECOG):  1-2 HEENT:  No visual changes, runny nose, sore throat, mouth sores or tenderness. Lungs: No shortness of breath or cough.  No hemoptysis. Cardiac:  No chest pain, palpitations, orthopnea, or PND. GI:  No nausea, vomiting, diarrhea, constipation, melena or hematochezia. GU:  No urgency,  frequency, dysuria, or hematuria. Musculoskeletal:  Severe rheumatoid arthritis.  Osteoporosis.  No muscle tenderness. Extremities:  No pain or swelling. Skin:  No rashes or skin changes. Neuro:  Neuropathy in hands and feet.  No headache, numbness or weakness, balance or coordination issues. Endocrine:  Diabetes on an insulin pump.  No thyroid issues, hot flashes or night sweats. Psych:  No mood changes, depression or anxiety. Pain:  No focal pain. Review of systems:  All other systems reviewed and found to be negative.   Physical Exam:  Blood pressure 119/68, pulse 84, temperature 97.8 F (36.6 C), temperature source Tympanic, resp. rate 18, height 5' 3"  (1.6 m), weight 124 lb 14.4 oz (56.7 kg), SpO2 96 %. GENERAL:  Well developed, well nourished, woman sitting comfortably in the exam room in no acute distress. MENTAL STATUS:  Alert and oriented to person, place and time. HEAD:  Wearing a white cap.  Alopecia. Normocephalic, atraumatic, face symmetric, no Cushingoid features. EYES:  Gold rimmed glasses.  Blue eyes.  Pupils equal round and reactive to light and accomodation.  No conjunctivitis or scleral icterus. ENT:  Oropharynx clear without lesion.  Tongue normal. Mucous membranes moist.  RESPIRATORY:  Decreased breath souns right lower lobe (no change). No rales, wheezes or rhonchi. CARDIOVASCULAR:  Regular rate and rhythm without murmur, rub or gallop. ABDOMEN:  Soft, non-tender, with active bowel sounds, and no hepatosplenomegaly.  Stable 2.5 cm mass on upper edge of ostomy (stable).  Multiple palpable nodules. SKIN:  No rashes, ulcers or lesions. Small abdominal ecchymosis at sites of Lovenox injections. EXTREMITIES: No edema, no skin discoloration or tenderness.  No palpable cords. LYMPH NODES: No palpable cervical, supraclavicular, axillary or inguinal adenopathy  NEUROLOGICAL: Unremarkable. PSYCH:  Appropriate.    Radiology studies: 02/13/2015:  Abdomen and pelvic CT revealed  bilateral mass-like adnexal regions (right adnexal mass 5.6 x 5.0 cm and the left adnexa mass 10.3 x 6.0 x 9.9 cm).  There was a large amount of soft tissue throughout the peritoneal cavity involving the omentum and other peritoneal surfaces.  There was a small volume ascites. There  was a peripheral 3.3 x 1.9 cm low-attenuation lesion overlying the right lobe of the liver, likely representing a serosal implant. There was 1.4 x 2.2 cm ill-defined peripheral lesion within the inferior aspect of segment 6 adjacent to the ampulla in the duodenum.  04/28/2015:  Abdominal and pelvic CT revealed decreasing bilateral ovarian masses.   The left adnexal mass measured 4.0 x 6.1 cm (previously 5.0 x 8.5 cm). The right ovary measured 3.8 x 4.9 cm (previously 4.7 x 5.6 cm).  There was improved peritoneal carcinomatosis.  There was a small amount of ascites.  The hepatic dome lesion was stable. The previously seen right hepatic lobe lesion was not well-visualized.  There was a small left pleural effusion and trace right pleural effusion.  The nodular lesion at the ampulla of Vater, extending into the duodenum was stable.  There was no biliary ductal dilatation. 08/01/2015:  Rght upper extremity ultrasound revealed a near occlusive thrombus within the central portion of the right internal jugular vein and central portion of the right subclavian vein. 09/01/2015:  Chest, abdomen, and pelvic CT revealed continued decrease in perihepatic fluid collection contiguous with the right pleural space with percutaneous drain.  09/11/2015:  Chest, abdomen, and pelvic CT revealed a residual versus recurrent fluid collection posterior to the right hepatic lobe (2.6 x 1.1 x 4.0 cm).   10/13/2015:  Chest, abdomen, and pelvic CT revealed resolution of the empyema. 02/15/2016:  Chest, abdomen, and pelvic CT revealed multiple soft tissue nodules throughout the pelvis, small bowel mesenteric and peritoneum and, concerning for peritoneal  metastatic disease.  There was soft tissue irregularity within the left upper quadrant.  There was mild right hydronephrosis (etiology unclear).  There was interval resolution of previously described right pleural based fluid and gas collection. There was a pleural based nodule within the right lower hemi-thorax concerning for pleural based metastasis.  There were small bilateral pleural effusions.  There was pulmonary nodularity, predominately within the left upper lobe (metastatic or infectious/inflammatory etiology).  There was slightly increased mediastinal adenopathy (infectious/inflammatory or metastatic).  There was a low attenuation lesion within the left hepatic lobe (complicated fluid within the fissure or metastatic disease). 03/08/2016:  Abdomen and pelvic CT revealed mild progression of peritoneal disease in the abdomen.  There was interval increase in loculated fluid around the lateral segment left liver, stomach, and spleen.  There was persistent soft tissue lesion at the level of the ampulla with mild intra and extrahepatic biliary duct dilatation.  There was persistent intrahepatic and capsular metastatic disease involving the liver. 05/26/2016:  Head MRI revealed multiple small areas of acute infarct involving the occipital parietal lobe bilaterally and the left lateral cerebellum,  consistent with posterior circulation emboli.  06/10/2016:  Adomen and pelvic CT revealed progression of peritoneal carcinomatosis predominantly in the perihepatic space, bilateral lower quadrants and pelvis.  There was worsening bilateral obstructive uropathy due to malignant involvement of the pelvic ureters.  There was stable small pleural/subpleural nodules at the right lung base.  There was stable small left pleural effusion.  There was decreased small volume perihepatic ascites.  08/09/2016:  Abdomen and pelvic CT revealed interval increase and loculated fluid collections within the peritoneal space consistent  with progression of peritoneal ovarian carcinoma metastasis.  There was one large 7 cm collection in the RIGHT lower quadrant which had increased significantly in size and may represent of point of small bowel obstruction.  The proximal stomach and duodenum were decompressed. The mid small bowel was mildly  dilated. There was concern for obstruction given the large intraperitoneal fluid collection.  There was increase in subcapsular hepatic metastatic fluid collections.  There was enlargement of rounded ampullary lesion in the second portion duodenum concerning for metastatic lesion.  There was mild biliary duct dilatation (stable).  There was nodular pleural metastasis in the RIGHT lower lobe pleural space. 11/03/2016:  Abdomen and pelvic CT revealed multifocal cystic metastases in the abdomen/pelvis, reflecting peritoneal disease.  The dominant lesion in the left anterior abdomen has progressed, while additional lesions were mixed.  Peritoneal disease along the liver and spleen progressed.  Pleural-based metastases along the posterior right hemithorax progressed. There were trace left pleural effusion.  There was a stable 1.7 cm duodenal lesion at the ampulla. 11/29/2016:  Abdomen and pelvic CT revealed mild interval enlargement of pleural metastasis the RIGHT lower lobe.  There was overall mild decrease in cystic peritoneal metastasis within the abdomen pelvis. The solid lesion at the terminal ileum was decreased in size. Subcapsular lesion in the liver was slightly decreased in size.  There was no evidence of disease progression the abdomen pelvis.  There was mild intrahepatic duct dilatation similar prior.  The ampullary lesion was slightly larger.  There was mild hydronephrosis and hydroureter on the LEFT likely related to cystic metastasis at the LEFT vesicoureteral junction 12/09/2016:  MRCP revealed the cystic lesion within the pancreatic head/uncinate process had decreased in size and demonstrated no  suspicious characteristics. This can be presumed a pseudocyst or focus of side branch duct ectasia.  There was similar to slight progression of extensive peritoneal metastasis since 11/29/2016. Grossly similar abdominal nodal and right pleural metastasis.  There was chronic mild common duct dilatation with similar soft tissue fullness about the ampulla compared to 11/29/2016.  There was improved to resolved left-sided hydronephrosis. 01/20/2017:  Abdomen and pelvic CT revealed complex mixed interval changes in widespread metastatic disease in the peritoneal cavity, lower chest and thoracic, abdominal and pelvic lymph nodes. Right lower chest pleural metastasis and several of the peritoneal tumor implants had increased.  There was new right inguinal lymphadenopathy.  Some of the peritoneal tumor implants had decreased.  The small dependent left pleural effusion was slightly increased .  The mild left hydroureteronephrosis was improved.  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   The chronic biliary ductal dilatation was stable .  There was no evidence of bowel obstruction. 05/22/2017:  Chest, abdomen, and pelvic CT revealed  significant enlargement of metastatic disease involving the right pleura, low paratracheal nodal chain, omentum, peritoneal surfaces, mesentery, and liver. Tumors extending through the ostomy site into the subcutaneous tissues. There were multiple new metastatic liver and right pleural lesions.  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  There was new sclerotic lesion in the T11 vertebral body, concerning for possible metastatic disease. There was healing anterior nondisplaced rib fractures of the right second through ninth ribs that appeared new. 07/10/2017:  Chest, abdomen and pelvic CT revealed RIGHT lower lobe pleural-based nodules, slightly smaller.  There was  stable bilateral pleural effusions (left > right) and stable mediastinal adenopathy.  There was evidence of progression of disease in the abdomen pelvis.  There was interval increase in size of hepatic lesions most noticeably in the LEFT hepatic lobe.  There was interval increase in bilateral hydronephrosis and hydroureter.  SEVERE BILATERAL HYDROURETER was secondary to large peritoneal masses in the pelvis  obstructing distal ureters.  There was interval increase in size of large peritoneal and pelvic peritoneal implants.  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  Admissions: [facility] from 06/15/2015 - 06/22/2015.  She underwent exploratory laparotomy, lysis of adhesions, total abdominal hysterectomy with bilateral salpingo-oophorectomy, infracolic omentectomy, optimal tumor debulking(< 1 cm), recto-sigmoid resection with creation of end colostomy, cholecystectomy, mobilization of splenic flexure and liver with diaphragmatic stripping on 06/15/2015. The right diaphragm was cleared of tumor. During dissection, the diaphragm was entered and closed with sutures.  [facility] from 07/01/2015 - 07/03/2015 with pleural effusion. [place] from 07/07/2015 - 07/09/2015 with recurrent pleural effusion. [facility] from 07/29/2015 - 08/10/2015 with a right-sided empyema and liver abscess. She underwent CT-guided placement of a liver abscess drain on 07/30/2015.  Liver abscess culture grew out group B strep and Enterobacter which was sensitive to Zosyn. She was transitioned to ertapenem Colbert Ewing) prior to discharge.  She was readmitted to [facility] on 09/17/2015. [facility] from 05/26/2016 - 05/28/2016 with altered mental status and an acute embolic CVA.  Head MRI on 05/26/2016 revealed multiple small areas of acute infarct involving the occipital parietal lobe bilaterally and the left lateral cerebellum,  consistent with posterior circulation emboli.  Work-up included a negative carotid ultrasound and  echocardiogram. [facility] from 08/10/2016 - 08/14/2016 with a small bowel obstruction.  She was managed conservatively. [place] from 09/27/2016 - 09/28/2016 with symptomatic anemia and diarrhea.   Appointment on 08/28/2017  Component Date Value Ref Range Status  . Magnesium 08/28/2017 1.2* 1.7 - 2.4 mg/dL Final   Performed at [facility], [address], [place], [place] [address]  . WBC 08/28/2017 2.0* 3.6 - 11.0 K/uL Final  . RBC 08/28/2017 3.49* 3.80 - 5.20 MIL/uL Final  . Hemoglobin 08/28/2017 9.7* 12.0 - 16.0 g/dL Final  . HCT 08/28/2017 29.0* 35.0 - 47.0 % Final  . MCV 08/28/2017 82.9  80.0 - 100.0 fL Final  . MCH 08/28/2017 27.8  26.0 - 34.0 pg Final  . MCHC 08/28/2017 33.6  32.0 - 36.0 g/dL Final  . RDW 08/28/2017 16.1* 11.5 - 14.5 % Final  . Platelets 08/28/2017 230  150 - 440 K/uL Final  . Neutrophils Relative % 08/28/2017 52  % Final  . Neutro Abs 08/28/2017 1.0* 1.4 - 6.5 K/uL Final  . Lymphocytes Relative 08/28/2017 28  % Final  . Lymphs Abs 08/28/2017 0.6* 1.0 - 3.6 K/uL Final  . Monocytes Relative 08/28/2017 17  % Final  . Monocytes Absolute 08/28/2017 0.3  0.2 - 0.9 K/uL Final  . Eosinophils Relative 08/28/2017 2  % Final  . Eosinophils Absolute 08/28/2017 0.0  0 - 0.7 K/uL Final  . Basophils Relative 08/28/2017 1  % Final  . Basophils Absolute 08/28/2017 0.0  0 - 0.1 K/uL Final   Performed at [facility], [address], [place],  [address]  . Sodium 08/28/2017 132* 135 - 145 mmol/L Final  . Potassium 08/28/2017 3.9  3.5 - 5.1 mmol/L Final  . Chloride 08/28/2017 99* 101 - 111 mmol/L Final  . CO2 08/28/2017 26  22 - 32 mmol/L Final  . Glucose, Bld 08/28/2017 267* 65 - 99 mg/dL Final  . BUN 08/28/2017 19  6 - 20 mg/dL Final  . Creatinine, Ser 08/28/2017 0.67  0.44 - 1.00 mg/dL Final  . Calcium 08/28/2017 9.3  8.9 - 10.3 mg/dL Final  . GFR calc non Af Amer 08/28/2017 >60  >60 mL/min Final  . GFR calc Af Amer 08/28/2017 >60  >  60 mL/min Final    Comment: (NOTE) The eGFR has been calculated using the CKD EPI equation. This calculation has not been validated in all clinical situations. eGFR's persistently <60 mL/min signify possible Chronic Kidney Disease.   Georgiann Hahn gap 08/28/2017 7  5 - 15 Final   Performed at [facility], [address] [place], [place] [address]    Assessment:  JENNAH SATCHELL is a 72 y.o. female with progressive stage IV ovarian cancer.  She presented with abdominal discomfort and bloating.  Omental biopsy on 02/23/2015 revealed metastatic high grade serous carcinoma, consistent with gynecologic origin.   She was initially diagnosed with clinical stage IIIC (T3cN1Mx).  CA125 was 707 on 02/17/2015.  She received 4 cycles of neoadjuvant carboplatin and Taxol (03/05/2015 - 05/22/2015).  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  She had loose stools on oral magnesium.  She was initially on Neurontin then switched Lyrica with cycle #3.  Cycle #4 was notable for neutropenia (ANC 300) requiring GCSF x 3 days.    She underwent exploratory laparotomy, lysis of adhesions, total abdominal hysterectomy with bilateral salpingo-oophorectomy, infracolic omentectomy, optimal tumor debulking(< 1 cm), recto-sigmoid resection with creation of end colostomy, cholecystectomy, mobilization of splenic flexure and liver with diaphragmatic stripping on 06/15/2015. The right diaphragm was cleared of tumor. During dissection, the diaphragm was entered and closed with sutures.  She received tamoxifen from 02/25/2016 - 03/14/2016.  She received 4 cycles of carboplatin and gemcitabine (03/21/2016 - 05/24/2016) with GCSF/Neulasta support.  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  Cymbalta began on 02/24/2017  PDL-1 testing revealed a combined score was 5 (>=1 positive).     [facility] genetic testing negative for APC, ATM, BMPR1A, BRCA1, BRCA2, BRIP1, CDH1, CDK4, CDKN2A, CHEK2, EPCAM, MLH1, MSH2, MSH6, MUTYH, NBN,  PALB2, PMS2, PTEN, RAD51C, RAD51D, SMAD4, STK11, TP53. Sequencing for select regions of POLE and POLD1, and large rearrangement analysis for select regions of GREM1 were negative.  She received 2 cycles of Doxil (06/21/2016 - 07/19/2016) with Neulasta support.  She received 4 cycles of topotecan with Neulasta support (08/22/2016 - 10/10/2016).    She received 3 cycles of pembrolizumab (Keytruda) from 12/06/2016 -  01/09/2017.  Cycle #1 was complicated by neutropenia (ANC 400) requiring GCSF/Granix x 3 days.  She received GCSF x 5 days with cycle #2.  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 Right lower chest pleural metastasis and several of the peritoneal tumor implants had increased.  There was new right inguinal lymphadenopathy.  Some of the peritoneal tumor implants had decreased.  The small dependent left pleural effusion was slightly increased .  The mild left hydroureteronephrosis was improved.  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   The chronic biliary ductal dilatation was stable .  There was no evidence of bowel obstruction.  CA125 was 802.9 on 03/30/2015, 567.9 on 04/13/2015, 168.8 on 05/15/2015, 85.2 on 07/17/2015, 68.6 on 07/28/2015, 34.5 on 09/17/2015, 20.7 on 11/06/2015, 49 on 01/22/2016, 106.1 on 02/22/2016, 138.8 on 03/07/2016, 220.8 on 03/21/2016, 152.8 on 04/11/2016, 125.6 on 04/18/2016, 76.8 on 05/03/2016, 60.2 on 05/24/2016, 44 on 07/19/2016, 65.8 on 08/17/2016, 53.4 on 09/12/2016, 47.3 on 10/10/2016, 91 on 12/20/2016, 141.1 on 01/09/2017, 463.7 on 02/27/2017, 907.1 on 03/27/2017, 192.1 on 05/22/2017, 189.1 on 05/24/2017, and 144.3 on 06/19/2017.  She has a history of recurrent right sided pleural  effusion.  She underwent thoracentesis of 650 cc on post-operative day 3.  She was admitted to [facility] on 07/01/2015 and 07/07/2015 for  recurrent shortness of breath.  She underwent 2 additional thoracenteses (1.1 L on 07/02/2015 and 850 cc on 07/08/2015).  Cytology was negative x 2.  Bilateral lower extremity duplex on 07/03/2015 was negative.  Echo revealed an EF of 55-60% on 07/08/2015 and 60-65% on 05/27/2016.    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 She was on Lovenox 60 mg twice a day.  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  She has severe rheumatoid arthritis.  Methotrexate and Enbrel were initially on hold.  She has a normocytic anemia.  Work-up on 02/17/2015 and 05/24/2016 revealed a normal ferritin, B12, folate, TSH.  She denies any melena or hematochezia.    She has anemia due to chronic disease. She received 1 unit PRBCs during her admission at [facility]. She denies any melena or hematochezia. She has diabetes and is on an insulin pump.  She has had persistent neutropenia felt secondary to her rheumatoid arthritis.  Folate and MMA were normal.  TSH was 6.13 (high) with a free T4 of 1.17 (0.61-1.12).  She began methotrexate (10 mg a week) and prednisone (5 mg a day) for severe rheumatoid arthritis on 12/17/2015.  She remains on prednisone alone (5 mg a day).  Bone marrow aspirate and biopsy on 06/09/2010 revealed a hypercellular marrow (70%) with no evidence of dysplasia or malignancy.  Flow cytometry was negative.  Cytogenetics were normal (46,XX).  FISH studies were negative for MDS.  Bone marrow aspirate and biopsy on 12/03/2015 revealed a normocellular to mildy hypercellular marrow for age (40%) with left shifted myelopoiesis, non specific dyserythropoiesis and mild megakaryocytic atypia with no increase in blasts.  There were multiple small nonspecific lymphoid aggregates (favor reactive). There was no increase in reticulin.   There was decreased myeloid cells (37%) with left shifted maturation and 1% atypical myelod blasts.  There was relatively increased monocytic cells (11%), relatively increased lymphoid cells (36%), and relatively increased eosinophils (6%).  Cytogenetics were normal (23, XX).  SNP microarray was normal.  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  She receives IV magnesium twice weekly.    She has chemotherapy induced anemia.  She has received Procrit (last 06/26/2017).  She received 1 unit of PRBCs on 06/05/2017.  Labs on 08/22/2016 revealed a normal B12 and folate.  Ferritin was 201, iron saturation 21% and TIBC 307 on 06/09/2017.  B12 and folate were normal.  Code status is DNR/DNI.  She developed acute LFT elevation on 11/28/2016.  Event was preceded by upper abdominal discomfort.  Hepatitis B core antibody total and hepatitis C antibody were negative.  She received 5 cycles of Abraxane (03/13/2017 - 06/26/2017).  Cycle #1 was truncated after day 1 secondary to side effects and Thanksgiving.  Chest, abdomen and pelvic CT on 07/10/2017 revealed progression of disease in the abdomen pelvis.  There was interval increase in size of hepatic lesions most noticeably in the LEFT hepatic lobe.  There was interval increase in bilateral hydronephrosis and hydroureter.  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  There was interval increase in size of large peritoneal and pelvic peritoneal implants.  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  She underwent bilateral ureteral stent placement on 07/13/2017.  She was [facility] to [facility] on 07/[facility].  Symptomatically, she is doing well.  Weight is up 3 pounds. Exam reveals increasing abdominal nodularity.  Magnesium is 1.2.  Plan: 1.  Labs today: CBC with diff, BMP, Mg. 2.  Discuss low magnesium. Magnesium 1.2 today. Will replace with 6 grams  intravenous magnesium today.   3.  Discuss anemia.  Hemoglobin stable at 9.7.  No transfusion needed at this time. 4.  Discuss ongoing symptom management including pain medications and antiemetics.  Continue to coordinate care with Hospice. 5.  RTC on Mondays and Fridays x 3 weeks for labs (Mg twice weekly; Mondays CBCs) and +/- Mg.  6.  RTC in 1 month for MD assessment and labs (CBC with diff, CMP, Mg), and IV Mg.    Honor Loh, NP 08/28/17, 4:35 PM   I saw and evaluated the patient, participating in the key portions of the service and reviewing pertinent diagnostic studies and records.  I reviewed the nurse practitioner's note and agree with the findings and the plan.  The assessment and plan were discussed with the patient.  Several questions were asked by the patient and answered.   Nolon Stalls, MD 08/28/2017, 4:35 PM

## 2017-08-28 ENCOUNTER — Encounter: Payer: Self-pay | Admitting: Hematology and Oncology

## 2017-08-28 ENCOUNTER — Inpatient Hospital Stay

## 2017-08-28 ENCOUNTER — Inpatient Hospital Stay: Admitting: Hematology and Oncology

## 2017-08-28 VITALS — BP 119/68 | HR 84 | Temp 97.8°F | Resp 18 | Ht 63.0 in | Wt 124.9 lb

## 2017-08-28 DIAGNOSIS — D709 Neutropenia, unspecified: Secondary | ICD-10-CM

## 2017-08-28 DIAGNOSIS — C801 Malignant (primary) neoplasm, unspecified: Secondary | ICD-10-CM

## 2017-08-28 DIAGNOSIS — Z87891 Personal history of nicotine dependence: Secondary | ICD-10-CM

## 2017-08-28 DIAGNOSIS — I509 Heart failure, unspecified: Secondary | ICD-10-CM

## 2017-08-28 DIAGNOSIS — D72819 Decreased white blood cell count, unspecified: Secondary | ICD-10-CM

## 2017-08-28 DIAGNOSIS — M069 Rheumatoid arthritis, unspecified: Secondary | ICD-10-CM

## 2017-08-28 DIAGNOSIS — I998 Other disorder of circulatory system: Secondary | ICD-10-CM

## 2017-08-28 DIAGNOSIS — K222 Esophageal obstruction: Secondary | ICD-10-CM

## 2017-08-28 DIAGNOSIS — I252 Old myocardial infarction: Secondary | ICD-10-CM

## 2017-08-28 DIAGNOSIS — N135 Crossing vessel and stricture of ureter without hydronephrosis: Secondary | ICD-10-CM

## 2017-08-28 DIAGNOSIS — E786 Lipoprotein deficiency: Secondary | ICD-10-CM

## 2017-08-28 DIAGNOSIS — Z90710 Acquired absence of both cervix and uterus: Secondary | ICD-10-CM

## 2017-08-28 DIAGNOSIS — C786 Secondary malignant neoplasm of retroperitoneum and peritoneum: Secondary | ICD-10-CM

## 2017-08-28 DIAGNOSIS — E109 Type 1 diabetes mellitus without complications: Secondary | ICD-10-CM

## 2017-08-28 DIAGNOSIS — Z79899 Other long term (current) drug therapy: Secondary | ICD-10-CM

## 2017-08-28 DIAGNOSIS — C561 Malignant neoplasm of right ovary: Secondary | ICD-10-CM

## 2017-08-28 DIAGNOSIS — I10 Essential (primary) hypertension: Secondary | ICD-10-CM

## 2017-08-28 DIAGNOSIS — E785 Hyperlipidemia, unspecified: Secondary | ICD-10-CM

## 2017-08-28 DIAGNOSIS — Z9221 Personal history of antineoplastic chemotherapy: Secondary | ICD-10-CM

## 2017-08-28 DIAGNOSIS — F329 Major depressive disorder, single episode, unspecified: Secondary | ICD-10-CM

## 2017-08-28 DIAGNOSIS — D6481 Anemia due to antineoplastic chemotherapy: Secondary | ICD-10-CM

## 2017-08-28 DIAGNOSIS — Z801 Family history of malignant neoplasm of trachea, bronchus and lung: Secondary | ICD-10-CM

## 2017-08-28 DIAGNOSIS — K219 Gastro-esophageal reflux disease without esophagitis: Secondary | ICD-10-CM

## 2017-08-28 DIAGNOSIS — Z7189 Other specified counseling: Secondary | ICD-10-CM

## 2017-08-28 DIAGNOSIS — M129 Arthropathy, unspecified: Secondary | ICD-10-CM

## 2017-08-28 DIAGNOSIS — C800 Disseminated malignant neoplasm, unspecified: Principal | ICD-10-CM

## 2017-08-28 DIAGNOSIS — Z794 Long term (current) use of insulin: Secondary | ICD-10-CM

## 2017-08-28 DIAGNOSIS — I2510 Atherosclerotic heart disease of native coronary artery without angina pectoris: Secondary | ICD-10-CM

## 2017-08-28 DIAGNOSIS — Z90722 Acquired absence of ovaries, bilateral: Secondary | ICD-10-CM

## 2017-08-28 DIAGNOSIS — R310 Gross hematuria: Secondary | ICD-10-CM

## 2017-08-28 DIAGNOSIS — T451X5S Adverse effect of antineoplastic and immunosuppressive drugs, sequela: Secondary | ICD-10-CM

## 2017-08-28 LAB — CBC WITH DIFFERENTIAL/PLATELET
Basophils Absolute: 0 10*3/uL (ref 0–0.1)
Basophils Relative: 1 %
Eosinophils Absolute: 0 10*3/uL (ref 0–0.7)
Eosinophils Relative: 2 %
HCT: 29 % — ABNORMAL LOW (ref 35.0–47.0)
Hemoglobin: 9.7 g/dL — ABNORMAL LOW (ref 12.0–16.0)
Lymphocytes Relative: 28 %
Lymphs Abs: 0.6 10*3/uL — ABNORMAL LOW (ref 1.0–3.6)
MCH: 27.8 pg (ref 26.0–34.0)
MCHC: 33.6 g/dL (ref 32.0–36.0)
MCV: 82.9 fL (ref 80.0–100.0)
Monocytes Absolute: 0.3 10*3/uL (ref 0.2–0.9)
Monocytes Relative: 17 %
Neutro Abs: 1 10*3/uL — ABNORMAL LOW (ref 1.4–6.5)
Neutrophils Relative %: 52 %
Platelets: 230 10*3/uL (ref 150–440)
RBC: 3.49 MIL/uL — ABNORMAL LOW (ref 3.80–5.20)
RDW: 16.1 % — ABNORMAL HIGH (ref 11.5–14.5)
WBC: 2 10*3/uL — ABNORMAL LOW (ref 3.6–11.0)

## 2017-08-28 LAB — MAGNESIUM: Magnesium: 1.2 mg/dL — ABNORMAL LOW (ref 1.7–2.4)

## 2017-08-28 LAB — BASIC METABOLIC PANEL
Anion gap: 7 (ref 5–15)
BUN: 19 mg/dL (ref 6–20)
CO2: 26 mmol/L (ref 22–32)
Calcium: 9.3 mg/dL (ref 8.9–10.3)
Chloride: 99 mmol/L — ABNORMAL LOW (ref 101–111)
Creatinine, Ser: 0.67 mg/dL (ref 0.44–1.00)
GFR calc Af Amer: >60 mL/min (ref >60)
GFR calc non Af Amer: >60 mL/min (ref >60)
Glucose, Bld: 267 mg/dL — ABNORMAL HIGH (ref 65–99)
Potassium: 3.9 mmol/L (ref 3.5–5.1)
Sodium: 132 mmol/L — ABNORMAL LOW (ref 135–145)

## 2017-08-28 MED ORDER — SODIUM CHLORIDE 0.9 % IV SOLN
6.0000 g | Freq: Once | INTRAVENOUS | Status: AC
  Administered 2017-08-28: 6 g via INTRAVENOUS
  Filled 2017-08-28: qty 10

## 2017-08-28 MED ORDER — HEPARIN SOD (PORK) LOCK FLUSH 100 UNIT/ML IV SOLN
500.0000 [IU] | Freq: Once | INTRAVENOUS | Status: AC
  Administered 2017-08-28: 500 [IU] via INTRAVENOUS

## 2017-08-28 MED ORDER — SODIUM CHLORIDE 0.9 % IV SOLN
INTRAVENOUS | Status: DC
  Administered 2017-08-28: 10:00:00 via INTRAVENOUS
  Filled 2017-08-28: qty 1000

## 2017-08-28 NOTE — Progress Notes (Signed)
No new changes noted today 

## 2017-08-29 ENCOUNTER — Other Ambulatory Visit: Payer: Self-pay | Admitting: *Deleted

## 2017-08-29 DIAGNOSIS — C569 Malignant neoplasm of unspecified ovary: Secondary | ICD-10-CM

## 2017-09-01 ENCOUNTER — Inpatient Hospital Stay: Attending: Hematology and Oncology

## 2017-09-01 ENCOUNTER — Inpatient Hospital Stay

## 2017-09-01 VITALS — BP 134/69 | HR 86 | Temp 97.0°F | Resp 20

## 2017-09-01 DIAGNOSIS — I10 Essential (primary) hypertension: Secondary | ICD-10-CM | POA: Diagnosis not present

## 2017-09-01 DIAGNOSIS — Z9221 Personal history of antineoplastic chemotherapy: Secondary | ICD-10-CM | POA: Diagnosis not present

## 2017-09-01 DIAGNOSIS — Z87891 Personal history of nicotine dependence: Secondary | ICD-10-CM | POA: Insufficient documentation

## 2017-09-01 DIAGNOSIS — Z90722 Acquired absence of ovaries, bilateral: Secondary | ICD-10-CM | POA: Insufficient documentation

## 2017-09-01 DIAGNOSIS — M129 Arthropathy, unspecified: Secondary | ICD-10-CM | POA: Insufficient documentation

## 2017-09-01 DIAGNOSIS — I252 Old myocardial infarction: Secondary | ICD-10-CM | POA: Diagnosis not present

## 2017-09-01 DIAGNOSIS — I509 Heart failure, unspecified: Secondary | ICD-10-CM | POA: Insufficient documentation

## 2017-09-01 DIAGNOSIS — Z66 Do not resuscitate: Secondary | ICD-10-CM | POA: Insufficient documentation

## 2017-09-01 DIAGNOSIS — I2510 Atherosclerotic heart disease of native coronary artery without angina pectoris: Secondary | ICD-10-CM | POA: Diagnosis not present

## 2017-09-01 DIAGNOSIS — Z8673 Personal history of transient ischemic attack (TIA), and cerebral infarction without residual deficits: Secondary | ICD-10-CM | POA: Diagnosis not present

## 2017-09-01 DIAGNOSIS — C561 Malignant neoplasm of right ovary: Secondary | ICD-10-CM

## 2017-09-01 DIAGNOSIS — M069 Rheumatoid arthritis, unspecified: Secondary | ICD-10-CM | POA: Insufficient documentation

## 2017-09-01 DIAGNOSIS — E876 Hypokalemia: Secondary | ICD-10-CM | POA: Diagnosis not present

## 2017-09-01 DIAGNOSIS — Z90710 Acquired absence of both cervix and uterus: Secondary | ICD-10-CM | POA: Insufficient documentation

## 2017-09-01 DIAGNOSIS — K219 Gastro-esophageal reflux disease without esophagitis: Secondary | ICD-10-CM | POA: Diagnosis not present

## 2017-09-01 DIAGNOSIS — F329 Major depressive disorder, single episode, unspecified: Secondary | ICD-10-CM | POA: Diagnosis not present

## 2017-09-01 DIAGNOSIS — E109 Type 1 diabetes mellitus without complications: Secondary | ICD-10-CM | POA: Insufficient documentation

## 2017-09-01 DIAGNOSIS — R0602 Shortness of breath: Secondary | ICD-10-CM | POA: Insufficient documentation

## 2017-09-01 DIAGNOSIS — C569 Malignant neoplasm of unspecified ovary: Secondary | ICD-10-CM | POA: Diagnosis not present

## 2017-09-01 DIAGNOSIS — Z79899 Other long term (current) drug therapy: Secondary | ICD-10-CM | POA: Insufficient documentation

## 2017-09-01 DIAGNOSIS — E785 Hyperlipidemia, unspecified: Secondary | ICD-10-CM | POA: Insufficient documentation

## 2017-09-01 DIAGNOSIS — D6481 Anemia due to antineoplastic chemotherapy: Secondary | ICD-10-CM | POA: Insufficient documentation

## 2017-09-01 DIAGNOSIS — C800 Disseminated malignant neoplasm, unspecified: Secondary | ICD-10-CM

## 2017-09-01 LAB — CBC WITH DIFFERENTIAL/PLATELET
Basophils Absolute: 0 10*3/uL (ref 0–0.1)
Basophils Relative: 1 %
Eosinophils Absolute: 0 10*3/uL (ref 0–0.7)
Eosinophils Relative: 1 %
HCT: 29.1 % — ABNORMAL LOW (ref 35.0–47.0)
Hemoglobin: 9.8 g/dL — ABNORMAL LOW (ref 12.0–16.0)
Lymphocytes Relative: 17 %
Lymphs Abs: 0.5 10*3/uL — ABNORMAL LOW (ref 1.0–3.6)
MCH: 27.4 pg (ref 26.0–34.0)
MCHC: 33.5 g/dL (ref 32.0–36.0)
MCV: 81.7 fL (ref 80.0–100.0)
Monocytes Absolute: 0.5 10*3/uL (ref 0.2–0.9)
Monocytes Relative: 17 %
Neutro Abs: 1.9 10*3/uL (ref 1.4–6.5)
Neutrophils Relative %: 64 %
Platelets: 228 10*3/uL (ref 150–440)
RBC: 3.56 MIL/uL — ABNORMAL LOW (ref 3.80–5.20)
RDW: 16.4 % — ABNORMAL HIGH (ref 11.5–14.5)
WBC: 2.9 10*3/uL — ABNORMAL LOW (ref 3.6–11.0)

## 2017-09-01 LAB — MAGNESIUM: Magnesium: 1.3 mg/dL — ABNORMAL LOW (ref 1.7–2.4)

## 2017-09-01 MED ORDER — SODIUM CHLORIDE 0.9 % IV SOLN
6.0000 g | Freq: Once | INTRAVENOUS | Status: AC
  Administered 2017-09-01: 6 g via INTRAVENOUS
  Filled 2017-09-01: qty 10

## 2017-09-01 MED ORDER — SODIUM CHLORIDE 0.9% FLUSH
10.0000 mL | Freq: Once | INTRAVENOUS | Status: AC
  Administered 2017-09-01: 10 mL via INTRAVENOUS
  Filled 2017-09-01: qty 10

## 2017-09-01 MED ORDER — HEPARIN SOD (PORK) LOCK FLUSH 100 UNIT/ML IV SOLN
500.0000 [IU] | Freq: Once | INTRAVENOUS | Status: AC
  Administered 2017-09-01: 500 [IU] via INTRAVENOUS
  Filled 2017-09-01: qty 5

## 2017-09-04 ENCOUNTER — Inpatient Hospital Stay

## 2017-09-04 DIAGNOSIS — C569 Malignant neoplasm of unspecified ovary: Secondary | ICD-10-CM

## 2017-09-04 DIAGNOSIS — C800 Disseminated malignant neoplasm, unspecified: Principal | ICD-10-CM

## 2017-09-04 DIAGNOSIS — C561 Malignant neoplasm of right ovary: Secondary | ICD-10-CM

## 2017-09-04 LAB — CBC WITH DIFFERENTIAL/PLATELET
Basophils Absolute: 0 10*3/uL (ref 0–0.1)
Basophils Relative: 1 %
Eosinophils Absolute: 0 10*3/uL (ref 0–0.7)
Eosinophils Relative: 1 %
HCT: 29.2 % — ABNORMAL LOW (ref 35.0–47.0)
Hemoglobin: 9.7 g/dL — ABNORMAL LOW (ref 12.0–16.0)
Lymphocytes Relative: 27 %
Lymphs Abs: 0.7 10*3/uL — ABNORMAL LOW (ref 1.0–3.6)
MCH: 27.2 pg (ref 26.0–34.0)
MCHC: 33.2 g/dL (ref 32.0–36.0)
MCV: 81.8 fL (ref 80.0–100.0)
Monocytes Absolute: 0.4 10*3/uL (ref 0.2–0.9)
Monocytes Relative: 16 %
Neutro Abs: 1.4 10*3/uL (ref 1.4–6.5)
Neutrophils Relative %: 55 %
Platelets: 230 10*3/uL (ref 150–440)
RBC: 3.57 MIL/uL — ABNORMAL LOW (ref 3.80–5.20)
RDW: 15.9 % — ABNORMAL HIGH (ref 11.5–14.5)
WBC: 2.5 10*3/uL — ABNORMAL LOW (ref 3.6–11.0)

## 2017-09-04 LAB — MAGNESIUM: Magnesium: 1.3 mg/dL — ABNORMAL LOW (ref 1.7–2.4)

## 2017-09-04 MED ORDER — SODIUM CHLORIDE 0.9% FLUSH
10.0000 mL | INTRAVENOUS | Status: DC | PRN
  Filled 2017-09-04: qty 10

## 2017-09-04 MED ORDER — MAGNESIUM SULFATE 50 % IJ SOLN
6.0000 g | Freq: Once | INTRAMUSCULAR | Status: AC
  Administered 2017-09-04: 6 g via INTRAVENOUS
  Filled 2017-09-04: qty 10

## 2017-09-04 MED ORDER — HEPARIN SOD (PORK) LOCK FLUSH 100 UNIT/ML IV SOLN
500.0000 [IU] | Freq: Once | INTRAVENOUS | Status: AC
  Administered 2017-09-04: 500 [IU] via INTRAVENOUS
  Filled 2017-09-04: qty 5

## 2017-09-05 ENCOUNTER — Other Ambulatory Visit: Payer: Self-pay | Admitting: Family Medicine

## 2017-09-05 NOTE — Telephone Encounter (Signed)
Last OV 05/12/17 last filled 09/10/16 90 [id number]

## 2017-09-08 ENCOUNTER — Inpatient Hospital Stay

## 2017-09-08 VITALS — BP 122/62 | HR 79 | Temp 97.3°F | Resp 20

## 2017-09-08 DIAGNOSIS — C800 Disseminated malignant neoplasm, unspecified: Secondary | ICD-10-CM

## 2017-09-08 DIAGNOSIS — C569 Malignant neoplasm of unspecified ovary: Secondary | ICD-10-CM

## 2017-09-08 DIAGNOSIS — C561 Malignant neoplasm of right ovary: Secondary | ICD-10-CM

## 2017-09-08 DIAGNOSIS — C801 Malignant (primary) neoplasm, unspecified: Secondary | ICD-10-CM

## 2017-09-08 LAB — CBC WITH DIFFERENTIAL/PLATELET
Basophils Absolute: 0 10*3/uL (ref 0–0.1)
Basophils Relative: 1 %
Eosinophils Absolute: 0 10*3/uL (ref 0–0.7)
Eosinophils Relative: 1 %
HCT: 29.2 % — ABNORMAL LOW (ref 35.0–47.0)
Hemoglobin: 9.6 g/dL — ABNORMAL LOW (ref 12.0–16.0)
Lymphocytes Relative: 23 %
Lymphs Abs: 0.6 10*3/uL — ABNORMAL LOW (ref 1.0–3.6)
MCH: 27 pg (ref 26.0–34.0)
MCHC: 33 g/dL (ref 32.0–36.0)
MCV: 81.9 fL (ref 80.0–100.0)
Monocytes Absolute: 0.5 10*3/uL (ref 0.2–0.9)
Monocytes Relative: 18 %
Neutro Abs: 1.5 10*3/uL (ref 1.4–6.5)
Neutrophils Relative %: 57 %
Platelets: 240 10*3/uL (ref 150–440)
RBC: 3.57 MIL/uL — ABNORMAL LOW (ref 3.80–5.20)
RDW: 16.2 % — ABNORMAL HIGH (ref 11.5–14.5)
WBC: 2.7 10*3/uL — ABNORMAL LOW (ref 3.6–11.0)

## 2017-09-08 LAB — MAGNESIUM: Magnesium: 1.3 mg/dL — ABNORMAL LOW (ref 1.7–2.4)

## 2017-09-08 MED ORDER — SODIUM CHLORIDE 0.9% FLUSH
10.0000 mL | INTRAVENOUS | Status: DC | PRN
  Administered 2017-09-08: 10 mL via INTRAVENOUS
  Filled 2017-09-08: qty 10

## 2017-09-08 MED ORDER — SODIUM CHLORIDE 0.9 % IV SOLN
INTRAVENOUS | Status: DC
  Administered 2017-09-08: 10:00:00 via INTRAVENOUS
  Filled 2017-09-08: qty 1000

## 2017-09-08 MED ORDER — SODIUM CHLORIDE 0.9 % IV SOLN
6.0000 g | Freq: Once | INTRAVENOUS | Status: AC
  Administered 2017-09-08: 6 g via INTRAVENOUS
  Filled 2017-09-08: qty 10

## 2017-09-08 MED ORDER — HEPARIN SOD (PORK) LOCK FLUSH 100 UNIT/ML IV SOLN
500.0000 [IU] | Freq: Once | INTRAVENOUS | Status: AC
  Administered 2017-09-08: 500 [IU] via INTRAVENOUS
  Filled 2017-09-08: qty 5

## 2017-09-11 ENCOUNTER — Inpatient Hospital Stay

## 2017-09-11 VITALS — BP 135/79 | HR 80 | Temp 97.9°F | Resp 18

## 2017-09-11 DIAGNOSIS — C569 Malignant neoplasm of unspecified ovary: Secondary | ICD-10-CM

## 2017-09-11 LAB — CBC WITH DIFFERENTIAL/PLATELET
Basophils Absolute: 0 10*3/uL (ref 0–0.1)
Basophils Relative: 1 %
Eosinophils Absolute: 0 10*3/uL (ref 0–0.7)
Eosinophils Relative: 1 %
HCT: 28.8 % — ABNORMAL LOW (ref 35.0–47.0)
Hemoglobin: 9.6 g/dL — ABNORMAL LOW (ref 12.0–16.0)
Lymphocytes Relative: 27 %
Lymphs Abs: 0.7 10*3/uL — ABNORMAL LOW (ref 1.0–3.6)
MCH: 27.1 pg (ref 26.0–34.0)
MCHC: 33.2 g/dL (ref 32.0–36.0)
MCV: 81.6 fL (ref 80.0–100.0)
Monocytes Absolute: 0.4 10*3/uL (ref 0.2–0.9)
Monocytes Relative: 16 %
Neutro Abs: 1.4 10*3/uL (ref 1.4–6.5)
Neutrophils Relative %: 55 %
Platelets: 221 10*3/uL (ref 150–440)
RBC: 3.53 MIL/uL — ABNORMAL LOW (ref 3.80–5.20)
RDW: 15.9 % — ABNORMAL HIGH (ref 11.5–14.5)
WBC: 2.6 10*3/uL — ABNORMAL LOW (ref 3.6–11.0)

## 2017-09-11 LAB — MAGNESIUM: Magnesium: 1.2 mg/dL — ABNORMAL LOW (ref 1.7–2.4)

## 2017-09-11 MED ORDER — SODIUM CHLORIDE 0.9% FLUSH
10.0000 mL | INTRAVENOUS | Status: DC | PRN
  Administered 2017-09-11: 10 mL via INTRAVENOUS
  Filled 2017-09-11: qty 10

## 2017-09-11 MED ORDER — SODIUM CHLORIDE 0.9 % IV SOLN
INTRAVENOUS | Status: DC
  Administered 2017-09-11: 09:00:00 via INTRAVENOUS
  Filled 2017-09-11: qty 1000

## 2017-09-11 MED ORDER — MAGNESIUM SULFATE 50 % IJ SOLN
6.0000 g | Freq: Once | INTRAMUSCULAR | Status: AC
  Administered 2017-09-11: 6 g via INTRAVENOUS
  Filled 2017-09-11: qty 2

## 2017-09-11 MED ORDER — HEPARIN SOD (PORK) LOCK FLUSH 100 UNIT/ML IV SOLN
500.0000 [IU] | Freq: Once | INTRAVENOUS | Status: AC
  Administered 2017-09-11: 500 [IU] via INTRAVENOUS
  Filled 2017-09-11: qty 5

## 2017-09-15 ENCOUNTER — Inpatient Hospital Stay

## 2017-09-15 DIAGNOSIS — C569 Malignant neoplasm of unspecified ovary: Secondary | ICD-10-CM

## 2017-09-15 LAB — CBC WITH DIFFERENTIAL/PLATELET
Basophils Absolute: 0 10*3/uL (ref 0–0.1)
Basophils Relative: 1 %
Eosinophils Absolute: 0 10*3/uL (ref 0–0.7)
Eosinophils Relative: 1 %
HCT: 29.1 % — ABNORMAL LOW (ref 35.0–47.0)
Hemoglobin: 9.8 g/dL — ABNORMAL LOW (ref 12.0–16.0)
Lymphocytes Relative: 31 %
Lymphs Abs: 0.8 10*3/uL — ABNORMAL LOW (ref 1.0–3.6)
MCH: 27.2 pg (ref 26.0–34.0)
MCHC: 33.6 g/dL (ref 32.0–36.0)
MCV: 80.8 fL (ref 80.0–100.0)
Monocytes Absolute: 0.4 10*3/uL (ref 0.2–0.9)
Monocytes Relative: 17 %
Neutro Abs: 1.3 10*3/uL — ABNORMAL LOW (ref 1.4–6.5)
Neutrophils Relative %: 50 %
Platelets: 240 10*3/uL (ref 150–440)
RBC: 3.6 MIL/uL — ABNORMAL LOW (ref 3.80–5.20)
RDW: 16.1 % — ABNORMAL HIGH (ref 11.5–14.5)
WBC: 2.5 10*3/uL — ABNORMAL LOW (ref 3.6–11.0)

## 2017-09-15 LAB — COMPREHENSIVE METABOLIC PANEL
ALT: 12 U/L — ABNORMAL LOW (ref 14–54)
AST: 17 U/L (ref 15–41)
Albumin: 3 g/dL — ABNORMAL LOW (ref 3.5–5.0)
Alkaline Phosphatase: 66 U/L (ref 38–126)
Anion gap: 9 (ref 5–15)
BUN: 18 mg/dL (ref 6–20)
CO2: 27 mmol/L (ref 22–32)
Calcium: 9.2 mg/dL (ref 8.9–10.3)
Chloride: 101 mmol/L (ref 101–111)
Creatinine, Ser: 0.72 mg/dL (ref 0.44–1.00)
GFR calc Af Amer: >60 mL/min (ref >60)
GFR calc non Af Amer: >60 mL/min (ref >60)
Glucose, Bld: 157 mg/dL — ABNORMAL HIGH (ref 65–99)
Potassium: 3.7 mmol/L (ref 3.5–5.1)
Sodium: 137 mmol/L (ref 135–145)
Total Bilirubin: 0.5 mg/dL (ref 0.3–1.2)
Total Protein: 6.9 g/dL (ref 6.5–8.1)

## 2017-09-15 LAB — MAGNESIUM: Magnesium: 1.2 mg/dL — ABNORMAL LOW (ref 1.7–2.4)

## 2017-09-15 MED ORDER — SODIUM CHLORIDE 0.9 % IV SOLN
6.0000 g | Freq: Once | INTRAVENOUS | Status: AC
  Administered 2017-09-15: 6 g via INTRAVENOUS
  Filled 2017-09-15: qty 10

## 2017-09-15 MED ORDER — HEPARIN SOD (PORK) LOCK FLUSH 100 UNIT/ML IV SOLN
500.0000 [IU] | Freq: Once | INTRAVENOUS | Status: AC
  Administered 2017-09-15: 500 [IU] via INTRAVENOUS

## 2017-09-15 MED ORDER — SODIUM CHLORIDE 0.9 % IV SOLN
INTRAVENOUS | Status: DC
  Administered 2017-09-15: 09:00:00 via INTRAVENOUS
  Filled 2017-09-15: qty 1000

## 2017-09-15 MED ORDER — SODIUM CHLORIDE 0.9% FLUSH
10.0000 mL | INTRAVENOUS | Status: DC | PRN
  Administered 2017-09-15: 10 mL via INTRAVENOUS
  Filled 2017-09-15: qty 10

## 2017-09-18 ENCOUNTER — Inpatient Hospital Stay

## 2017-09-18 DIAGNOSIS — C561 Malignant neoplasm of right ovary: Secondary | ICD-10-CM

## 2017-09-18 DIAGNOSIS — C800 Disseminated malignant neoplasm, unspecified: Principal | ICD-10-CM

## 2017-09-18 DIAGNOSIS — C569 Malignant neoplasm of unspecified ovary: Secondary | ICD-10-CM | POA: Diagnosis not present

## 2017-09-18 LAB — MAGNESIUM: Magnesium: 1.3 mg/dL — ABNORMAL LOW (ref 1.7–2.4)

## 2017-09-18 MED ORDER — SODIUM CHLORIDE 0.9 % IV SOLN
6.0000 g | Freq: Once | INTRAVENOUS | Status: AC
  Administered 2017-09-18: 6 g via INTRAVENOUS
  Filled 2017-09-18: qty 10

## 2017-09-18 MED ORDER — SODIUM CHLORIDE 0.9% FLUSH
10.0000 mL | INTRAVENOUS | Status: AC | PRN
  Filled 2017-09-18: qty 10

## 2017-09-18 MED ORDER — HEPARIN SOD (PORK) LOCK FLUSH 100 UNIT/ML IV SOLN
500.0000 [IU] | Freq: Once | INTRAVENOUS | Status: AC
  Administered 2017-09-18: 500 [IU] via INTRAVENOUS
  Filled 2017-09-18: qty 5

## 2017-09-22 ENCOUNTER — Inpatient Hospital Stay

## 2017-09-22 ENCOUNTER — Other Ambulatory Visit: Payer: Self-pay | Admitting: *Deleted

## 2017-09-22 VITALS — BP 126/62 | HR 83 | Temp 96.6°F | Resp 18

## 2017-09-22 DIAGNOSIS — C569 Malignant neoplasm of unspecified ovary: Secondary | ICD-10-CM | POA: Diagnosis not present

## 2017-09-22 DIAGNOSIS — C800 Disseminated malignant neoplasm, unspecified: Principal | ICD-10-CM

## 2017-09-22 DIAGNOSIS — C561 Malignant neoplasm of right ovary: Secondary | ICD-10-CM

## 2017-09-22 LAB — MAGNESIUM: Magnesium: 1.3 mg/dL — ABNORMAL LOW (ref 1.7–2.4)

## 2017-09-22 MED ORDER — MAGNESIUM SULFATE 50 % IJ SOLN
6.0000 g | Freq: Once | INTRAMUSCULAR | Status: AC
  Administered 2017-09-22: 6 g via INTRAVENOUS
  Filled 2017-09-22: qty 10

## 2017-09-22 MED ORDER — SODIUM CHLORIDE 0.9% FLUSH
10.0000 mL | Freq: Once | INTRAVENOUS | Status: AC
  Administered 2017-09-22: 10 mL via INTRAVENOUS
  Filled 2017-09-22: qty 10

## 2017-09-22 MED ORDER — HEPARIN SOD (PORK) LOCK FLUSH 100 UNIT/ML IV SOLN
500.0000 [IU] | Freq: Once | INTRAVENOUS | Status: DC
  Filled 2017-09-22: qty 5

## 2017-09-22 MED ORDER — SODIUM CHLORIDE 0.9 % IV SOLN
Freq: Once | INTRAVENOUS | Status: AC
  Administered 2017-09-22: 09:00:00 via INTRAVENOUS
  Filled 2017-09-22: qty 1000

## 2017-09-22 MED ORDER — HEPARIN SOD (PORK) LOCK FLUSH 100 UNIT/ML IV SOLN
500.0000 [IU] | Freq: Once | INTRAVENOUS | Status: AC
  Administered 2017-09-22: 500 [IU] via INTRAVENOUS

## 2017-09-23 NOTE — Progress Notes (Signed)
[facility] day:  09/26/17  Chief Complaint: [name] is a 72 y.o. female with stage IV ovarian cancer who is seen for 4 week assessment.  HPI:  The patient was last seen in the medical oncology clinic on 08/28/2017.  At that time, patient had been doing well.  She denied any abdominal complaints or shortness of breath.  Her energy was stable.  She is eating well with no significant weight loss.  Patient being followed at home by hospice services.  Exam revealed increasing abdominal nodularity.  Magnesium low at 1.2.  She received 6 g of intravenous magnesium replacement.  Patient has returned to the clinic twice weekly (Mondays and Fridays) since her last visit for lab monitoring and electrolyte replacement.  Patient with chronic refractory HYPOmagnesemia, which has required 6 grams of intravenous magnesium replacement at each visit.  During the interim, she has done well.  She is eating well.  She denies any nausea, vomiting or diarrhea.  She has lost the taste for breakfast.  She eats hardy at times.  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   Past Medical History:  Diagnosis Date  . Anemia   . Arthritis   . CAD (coronary artery disease)    a. 09/2012 Cath: LM nl, LAD 95p, 66m LCX 942mOM2 50, RCA 100.  . Marland KitchenHF (congestive heart failure) (HCP[place]  . Cholelithiasis   . Collagen vascular disease (HCC)    Rheumatoid Arthritis.  . Depression   . Environmental and seasonal allergies   . Esophageal stricture   . Exertional shortness of breath   . GERD (gastroesophageal reflux disease)   . H/O hiatal hernia   . Herniated disc   . History of pancytopenia   . Hyperlipidemia   . Hypertension   . Hypokalemia   . Leukopenia 2012   s/p bone marrow biopsy, Dr. PaMa Hillock. Neuropathy   . NSTEMI (non-ST elevated myocardial infarction) (HCPuhi5/2014   "mild" (10/18/2012)  . Ovarian cancer ([facility]2016   chemo and hysterectomy  . Pneumonia 2013; 08/2012   "one  lung; double" (10/18/2012)  . Rheumatoid arthritis(714.0)   . Stroke ([facility]  . Type I diabetes mellitus ([facility]CH[place]   "dx'd in 1957" (10/18/2012)  . Ureter obstruction     Past Surgical History:  Procedure Laterality Date  . ABDOMINAL HYSTERECTOMY  06/15/2015   Dx L/S, EXLAP TAH BSO omentectomy RSRx colostomy diaphragm resection stripping  . CARDIAC CATHETERIZATION  10/18/2012   "first one was today" (10/18/2012)  . CATARACT EXTRACTION W/ INTRAOCULAR LENS IMPLANT Right 2010  . CHOLECYSTECTOMY  06/15/2015   combined case with ovarian cancer debulking  . COLON SURGERY    . CORONARY ARTERY BYPASS GRAFT N/A 10/19/2012   Procedure: CORONARY ARTERY BYPASS GRAFTING (CABG);  Surgeon: StMelrose NakayamaMD;  [facility]: [facility];  Laterality: N/A;  . CYSTOSCOPY W/ RETROGRADES Bilateral 07/13/2017   Procedure: CYSTOSCOPY WITH RETROGRADE PYELOGRAM;  Surgeon: BrHollice EspyMD;  Location: [facility] ORS;  Service: Urology;  Laterality: Bilateral;  . CYSTOSCOPY WITH STENT PLACEMENT Bilateral 07/13/2017   Procedure: CYSTOSCOPY WITH STENT PLACEMENT;  Surgeon: BrHollice EspyMD;  Location: [facility] ORS;  Service: Urology;  Laterality: Bilateral;  . ESOPHAGEAL DILATION     "3 or 4 times" (10/19/2011)  . ESOPHAGOGASTRODUODENOSCOPY  2012   Dr. IfMinna Merritts. OSTOMY    . OVARY SURGERY     removal  . PERIPHERAL VASCULAR  CATHETERIZATION N/A 03/02/2015   Procedure: Glori Luis Cath Insertion;  Surgeon: Algernon Huxley, MD;  Location: [facility];  Service: Cardiovascular;  Laterality: N/A;  . TUBAL LIGATION  1970    Family History  Problem Relation Age of Onset  . Diabetes Mother   . Arthritis Mother   . Diabetes Father   . Arthritis Father   . Aneurysm Sister        neck  . Cancer Sister        lung    Social History:  reports that she quit smoking about 23 years ago. Her smoking use included cigarettes. She has a 30.00 pack-year smoking history. She has never used smokeless tobacco. She  reports that she does not drink alcohol or use drugs.  Patient is a retired Art therapist. Patient denies exposure to radiation and toxins. She spends every Thursday afternoon with her sister.  Her son is [name] and daughter is [name].  She is accompanied by her husband, [name], today.  Allergies:  Allergies  Allergen Reactions  . Codeine Nausea And Vomiting  . Latex Rash    Current Medications: Current Outpatient Medications  Medication Sig Dispense Refill  . Calcium-Vitamin D 600-200 MG-UNIT tablet Take 1 tablet by mouth 2 (two) times daily.     . cholecalciferol (VITAMIN D) 1000 units tablet Take 1,000 Units by mouth daily.    . DULoxetine (CYMBALTA) 20 MG capsule Take 1 capsule (20 mg total) by mouth daily. 90 capsule 2  . enoxaparin (LOVENOX) 60 MG/0.6ML injection INJECT 0.6ML (60MG TOTAL) UNDER THE SKINEVERY 12 HOURS 36 mL 1  . folic acid (FOLVITE) 1 MG tablet TAKE 1 TABLET BY MOUTH ONCE DAILY 90 tablet 3  . insulin lispro (HUMALOG) 100 UNIT/ML injection Inject 0.06 mLs (6 Units total) into the skin daily. (Patient taking differently: Inject 0-85 Units into the skin daily. Via insulin pump) 30 mL 3  . lidocaine-prilocaine (EMLA) cream APPLY 1 APPLICATION TOPICALLY AS NEEDED 1 HOUR PRIOR TO TREATMENT. COVER WITH PRESS N SEAL UNTIL TREATMENT TIME AS DIRECTED 30 g 1  . loratadine (CLARITIN) 10 MG tablet Take 1 tablet by mouth daily as needed.    . magnesium oxide (MAG-OX) 400 (241.3 Mg) MG tablet TAKE 1 TABLET BY MOUTH ONCE DAILY 30 tablet 1  . ondansetron (ZOFRAN) 8 MG tablet TAKE 1 TABLET BY MOUTH EVERY 8 HOURS AS NEEDED FOR NAUSEA AND VOMITING 30 tablet 1  . pantoprazole (PROTONIX) 40 MG tablet TAKE 1 TABLET BY MOUTH TWICE (2) DAILY 180 tablet 1  . potassium chloride SA (K-DUR,KLOR-CON) 20 MEQ tablet TAKE 1 TABLET BY MOUTH ONCE A DAY 90 tablet 1  . predniSONE (DELTASONE) 5 MG tablet Take 5 mg by mouth daily with breakfast.    . vitamin C (ASCORBIC ACID) 500 MG tablet Take 500 mg by  mouth daily.    Marland Kitchen zinc gluconate 50 MG tablet Take 50 mg daily by mouth.    . metoprolol tartrate (LOPRESSOR) 25 MG tablet Take 0.5 tablets (12.5 mg total) by mouth daily after breakfast. (Patient not taking: Reported on 09/26/2017) 45 tablet 3  . oxybutynin (DITROPAN) 5 MG tablet Take 1 tablet (5 mg total) by mouth every 8 (eight) hours as needed for bladder spasms. (Patient not taking: Reported on 08/28/2017) 30 tablet 2   No current facility-administered medications for this visit.    Facility-Administered Medications Ordered in Other Visits  Medication Dose Route Frequency Provider Last Rate Last Dose  . 0.9 %  sodium chloride infusion   Intravenous Continuous Lequita Asal, MD   Stopped at 04/24/17 1140  . heparin lock flush 100 unit/mL  500 Units Intravenous Once Corcoran, Melissa C, MD      . magnesium sulfate IVPB 4 g 100 mL  4 g Intravenous Once Faythe Casa E, NP      . sodium chloride 0.9 % injection 10 mL  10 mL Intracatheter PRN Corcoran, Melissa C, MD      . sodium chloride 0.9 % injection 10 mL  10 mL Intravenous PRN Lequita Asal, MD   10 mL at 04/13/15 0852  . sodium chloride flush (NS) 0.9 % injection 10 mL  10 mL Intravenous PRN Corcoran, Melissa C, MD      . sodium chloride flush (NS) 0.9 % injection 10 mL  10 mL Intravenous PRN Lequita Asal, MD   10 mL at 09/26/17 7619    Review of Systems  Constitutional: Positive for weight loss (5 pounds). Negative for diaphoresis, fever and malaise/fatigue.  HENT: Negative.  Negative for nosebleeds, sinus pain and sore throat.   Eyes: Negative.  Negative for blurred vision, double vision, discharge and redness.  Respiratory: Negative.  Negative for cough, hemoptysis, sputum production and shortness of breath.   Cardiovascular: Negative.  Negative for chest pain, palpitations, orthopnea and leg swelling.  Gastrointestinal: Negative for abdominal pain, blood in stool, constipation, diarrhea, melena, nausea and  vomiting.       Not eating breakfast; eating hardy sometimes  Genitourinary: Negative.  Negative for dysuria, frequency, hematuria and urgency.  Musculoskeletal: Positive for joint pain (Severe rheumatoid arthritis). Negative for back pain, falls, myalgias and neck pain.  Skin: Negative.  Negative for itching and rash.  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 Negative for dizziness, tremors, weakness and headaches.  Endo/Heme/Allergies: Does not bruise/bleed easily.       Diabetes; on insulin pump  Psychiatric/Behavioral: Negative for depression, memory loss and suicidal ideas. The patient is not nervous/anxious and does not have insomnia.   All other systems reviewed and are negative.  Performance status (ECOG): 2 - Symptomatic, <50% confined to bed   Physical Exam:  Blood pressure 119/68, pulse 90, temperature (!) 97 F (36.1 C), temperature source Tympanic, resp. rate 18, weight 119 lb 3 oz (54.1 kg). GENERAL:  Well developed, well nourished, woman sitting comfortably in the exam room in no acute distress. MENTAL STATUS:  Alert and oriented to person, place and time. HEAD:  Pearline Cables short hair.  Normocephalic, atraumatic, face symmetric, no Cushingoid features. EYES:  Glasses.  Blue eyes.  Pupils equal round and reactive to light and accomodation.  No conjunctivitis or scleral icterus. ENT:  Oropharynx clear without lesion.  Tongue normal. Mucous membranes moist.  RESPIRATORY:  Decreased breath sound RLL.  Clear to auscultation otherwise without rales, wheezes or rhonchi. CARDIOVASCULAR:  Regular rate and rhythm without murmur, rub or gallop. ABDOMEN:  Soft, non-tender, with active bowel sounds, and no hepatosplenomegaly.   Palplble peritoneal nodularity.  Largest nodule above ostomy (2-3 cm). SKIN:  No rashes, ulcers or lesions. EXTREMITIES: No edema, no skin discoloration or tenderness.  No palpable cords. LYMPH NODES: No palpable cervical,  supraclavicular, axillary or inguinal adenopathy  NEUROLOGICAL: Unremarkable. PSYCH:  Appropriate.    Radiology studies: 02/13/2015:  Abdomen and pelvic CT revealed bilateral mass-like adnexal regions (right adnexal mass 5.6 x 5.0 cm and the left adnexa mass 10.3 x 6.0 x 9.9 cm).  There was  a large amount of soft tissue throughout the peritoneal cavity involving the omentum and other peritoneal surfaces.  There was a small volume ascites. There was a peripheral 3.3 x 1.9 cm low-attenuation lesion overlying the right lobe of the liver, likely representing a serosal implant. There was 1.4 x 2.2 cm ill-defined peripheral lesion within the inferior aspect of segment 6 adjacent to the ampulla in the duodenum.  04/28/2015:  Abdominal and pelvic CT revealed decreasing bilateral ovarian masses.   The left adnexal mass measured 4.0 x 6.1 cm (previously 5.0 x 8.5 cm). The right ovary measured 3.8 x 4.9 cm (previously 4.7 x 5.6 cm).  There was improved peritoneal carcinomatosis.  There was a small amount of ascites.  The hepatic dome lesion was stable. The previously seen right hepatic lobe lesion was not well-visualized.  There was a small left pleural effusion and trace right pleural effusion.  The nodular lesion at the ampulla of Vater, extending into the duodenum was stable.  There was no biliary ductal dilatation. 08/01/2015:  Rght upper extremity ultrasound revealed a near occlusive thrombus within the central portion of the right internal jugular vein and central portion of the right subclavian vein. 09/01/2015:  Chest, abdomen, and pelvic CT revealed continued decrease in perihepatic fluid collection contiguous with the right pleural space with percutaneous drain.  09/11/2015:  Chest, abdomen, and pelvic CT revealed a residual versus recurrent fluid collection posterior to the right hepatic lobe (2.6 x 1.1 x 4.0 cm).   10/13/2015:  Chest, abdomen, and pelvic CT revealed resolution of the  empyema. 02/15/2016:  Chest, abdomen, and pelvic CT revealed multiple soft tissue nodules throughout the pelvis, small bowel mesenteric and peritoneum and, concerning for peritoneal metastatic disease.  There was soft tissue irregularity within the left upper quadrant.  There was mild right hydronephrosis (etiology unclear).  There was interval resolution of previously described right pleural based fluid and gas collection. There was a pleural based nodule within the right lower hemi-thorax concerning for pleural based metastasis.  There were small bilateral pleural effusions.  There was pulmonary nodularity, predominately within the left upper lobe (metastatic or infectious/inflammatory etiology).  There was slightly increased mediastinal adenopathy (infectious/inflammatory or metastatic).  There was a low attenuation lesion within the left hepatic lobe (complicated fluid within the fissure or metastatic disease). 03/08/2016:  Abdomen and pelvic CT revealed mild progression of peritoneal disease in the abdomen.  There was interval increase in loculated fluid around the lateral segment left liver, stomach, and spleen.  There was persistent soft tissue lesion at the level of the ampulla with mild intra and extrahepatic biliary duct dilatation.  There was persistent intrahepatic and capsular metastatic disease involving the liver. 05/26/2016:  Head MRI revealed multiple small areas of acute infarct involving the occipital parietal lobe bilaterally and the left lateral cerebellum,  consistent with posterior circulation emboli.  06/10/2016:  Adomen and pelvic CT revealed progression of peritoneal carcinomatosis predominantly in the perihepatic space, bilateral lower quadrants and pelvis.  There was worsening bilateral obstructive uropathy due to malignant involvement of the pelvic ureters.  There was stable small pleural/subpleural nodules at the right lung base.  There was stable small left pleural effusion.  There  was decreased small volume perihepatic ascites.  08/09/2016:  Abdomen and pelvic CT revealed interval increase and loculated fluid collections within the peritoneal space consistent with progression of peritoneal ovarian carcinoma metastasis.  There was one large 7 cm collection in the RIGHT lower quadrant which had increased significantly  in size and may represent of point of small bowel obstruction.  The proximal stomach and duodenum were decompressed. The mid small bowel was mildly dilated. There was concern for obstruction given the large intraperitoneal fluid collection.  There was increase in subcapsular hepatic metastatic fluid collections.  There was enlargement of rounded ampullary lesion in the second portion duodenum concerning for metastatic lesion.  There was mild biliary duct dilatation (stable).  There was nodular pleural metastasis in the RIGHT lower lobe pleural space. 11/03/2016:  Abdomen and pelvic CT revealed multifocal cystic metastases in the abdomen/pelvis, reflecting peritoneal disease.  The dominant lesion in the left anterior abdomen has progressed, while additional lesions were mixed.  Peritoneal disease along the liver and spleen progressed.  Pleural-based metastases along the posterior right hemithorax progressed. There were trace left pleural effusion.  There was a stable 1.7 cm duodenal lesion at the ampulla. 11/29/2016:  Abdomen and pelvic CT revealed mild interval enlargement of pleural metastasis the RIGHT lower lobe.  There was overall mild decrease in cystic peritoneal metastasis within the abdomen pelvis. The solid lesion at the terminal ileum was decreased in size. Subcapsular lesion in the liver was slightly decreased in size.  There was no evidence of disease progression the abdomen pelvis.  There was mild intrahepatic duct dilatation similar prior.  The ampullary lesion was slightly larger.  There was mild hydronephrosis and hydroureter on the LEFT likely related to cystic  metastasis at the LEFT vesicoureteral junction 12/09/2016:  MRCP revealed the cystic lesion within the pancreatic head/uncinate process had decreased in size and demonstrated no suspicious characteristics. This can be presumed a pseudocyst or focus of side branch duct ectasia.  There was similar to slight progression of extensive peritoneal metastasis since 11/29/2016. Grossly similar abdominal nodal and right pleural metastasis.  There was chronic mild common duct dilatation with similar soft tissue fullness about the ampulla compared to 11/29/2016.  There was improved to resolved left-sided hydronephrosis. 01/20/2017:  Abdomen and pelvic CT revealed complex mixed interval changes in widespread metastatic disease in the peritoneal cavity, lower chest and thoracic, abdominal and pelvic lymph nodes. Right lower chest pleural metastasis and several of the peritoneal tumor implants had increased.  There was new right inguinal lymphadenopathy.  Some of the peritoneal tumor implants had decreased.  The small dependent left pleural effusion was slightly increased .  The mild left hydroureteronephrosis was improved.  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   The chronic biliary ductal dilatation was stable .  There was no evidence of bowel obstruction. 05/22/2017:  Chest, abdomen, and pelvic CT revealed  significant enlargement of metastatic disease involving the right pleura, low paratracheal nodal chain, omentum, peritoneal surfaces, mesentery, and liver. Tumors extending through the ostomy site into the subcutaneous tissues. There were multiple new metastatic liver and right pleural lesions.  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  There was new sclerotic lesion in the T11 vertebral body, concerning for possible metastatic disease. There was healing anterior nondisplaced rib fractures of  the right second through ninth ribs that appeared new. 07/10/2017:  Chest, abdomen and pelvic CT revealed RIGHT lower lobe pleural-based nodules, slightly smaller.  There was stable bilateral pleural effusions (left > right) and stable mediastinal adenopathy.  There was evidence of progression of disease in the abdomen pelvis.  There was interval increase in size of hepatic lesions most noticeably in the LEFT  hepatic lobe.  There was interval increase in bilateral hydronephrosis and hydroureter.  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  There was interval increase in size of large peritoneal and pelvic peritoneal implants.  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  Admissions: [place] from 06/15/2015 - 06/22/2015.  She underwent exploratory laparotomy, lysis of adhesions, total abdominal hysterectomy with bilateral salpingo-oophorectomy, infracolic omentectomy, optimal tumor debulking(< 1 cm), recto-sigmoid resection with creation of end colostomy, cholecystectomy, mobilization of splenic flexure and liver with diaphragmatic stripping on 06/15/2015. The right diaphragm was cleared of tumor. During dissection, the diaphragm was entered and closed with sutures.  [place] from 07/01/2015 - 07/03/2015 with pleural effusion. [place] from 07/07/2015 - 07/09/2015 with recurrent pleural effusion. [place] from 07/29/2015 - 08/10/2015 with a right-sided empyema and liver abscess. She underwent CT-guided placement of a liver abscess drain on 07/30/2015.  Liver abscess culture grew out group B strep and Enterobacter which was sensitive to Zosyn. She was transitioned to ertapenem Colbert Ewing) prior to discharge.  She was read[facility] to [facility] on 09/17/2015. [place] from 05/26/2016 - 05/28/2016 with altered mental status and an acute embolic CVA.  Head MRI on 05/26/2016 revealed multiple small areas of acute infarct involving the occipital  parietal lobe bilaterally and the left lateral cerebellum,  consistent with posterior circulation emboli.  Work-up included a negative carotid ultrasound and echocardiogram. [facility] from 08/10/2016 - 08/14/2016 with a small bowel obstruction.  She was managed conservatively. [place] from 09/27/2016 - 09/28/2016 with symptomatic anemia and diarrhea.   Infusion on 09/26/2017  Component Date Value Ref Range Status  . Magnesium 09/26/2017 1.3* 1.7 - 2.4 mg/dL Final   Performed at [facility], [address] [place], [place] [address]  . Sodium 09/26/2017 136  135 - 145 mmol/L Final  . Potassium 09/26/2017 4.2  3.5 - 5.1 mmol/L Final  . Chloride 09/26/2017 99* 101 - 111 mmol/L Final  . CO2 09/26/2017 27  22 - 32 mmol/L Final  . Glucose, Bld 09/26/2017 195* 65 - 99 mg/dL Final  . BUN 09/26/2017 24* 6 - 20 mg/dL Final  . Creatinine, Ser 09/26/2017 0.78  0.44 - 1.00 mg/dL Final  . Calcium 09/26/2017 9.9  8.9 - 10.3 mg/dL Final  . Total Protein 09/26/2017 7.3  6.5 - 8.1 g/dL Final  . Albumin 09/26/2017 3.3* 3.5 - 5.0 g/dL Final  . AST 09/26/2017 14* 15 - 41 U/L Final  . ALT 09/26/2017 10* 14 - 54 U/L Final  . Alkaline Phosphatase 09/26/2017 72  38 - 126 U/L Final  . Total Bilirubin 09/26/2017 0.3  0.3 - 1.2 mg/dL Final  . GFR calc non Af Amer 09/26/2017 >60  >60 mL/min Final  . GFR calc Af Amer 09/26/2017 >60  >60 mL/min Final   Comment: (NOTE) The eGFR has been calculated using the CKD EPI equation. This calculation has not been validated in all clinical situations. eGFR's persistently <60 mL/min signify possible Chronic Kidney Disease.   Georgiann Hahn gap 09/26/2017 10  5 - 15 Final   Performed at [facility], [address] [place], [place] [address]  . WBC 09/26/2017 3.1* 3.6 - 11.0 K/uL Final  . RBC 09/26/2017 3.74* 3.80 - 5.20 MIL/uL Final  . Hemoglobin 09/26/2017 10.0* 12.0 - 16.0 g/dL Final  . HCT 09/26/2017 30.1* 35.0 - 47.0 % Final  . MCV 09/26/2017 80.5  80.0 - 100.0 fL  Final  . MCH 09/26/2017 26.7  26.0 -  34.0 pg Final  . MCHC 09/26/2017 33.1  32.0 - 36.0 g/dL Final  . RDW 09/26/2017 16.3* 11.5 - 14.5 % Final  . Platelets 09/26/2017 257  150 - 440 K/uL Final  . Neutrophils Relative % 09/26/2017 52  % Final  . Neutro Abs 09/26/2017 1.6  1.4 - 6.5 K/uL Final  . Lymphocytes Relative 09/26/2017 27  % Final  . Lymphs Abs 09/26/2017 0.8* 1.0 - 3.6 K/uL Final  . Monocytes Relative 09/26/2017 18  % Final  . Monocytes Absolute 09/26/2017 0.6  0.2 - 0.9 K/uL Final  . Eosinophils Relative 09/26/2017 1  % Final  . Eosinophils Absolute 09/26/2017 0.0  0 - 0.7 K/uL Final  . Basophils Relative 09/26/2017 2  % Final  . Basophils Absolute 09/26/2017 0.1  0 - 0.1 K/uL Final   Performed at [facility], [address] [place], [place] [address]    Assessment:  RUTHELLEN TIPPY is a 72 y.o. female with progressive stage IV ovarian cancer.  She presented with abdominal discomfort and bloating.  Omental biopsy on 02/23/2015 revealed metastatic high grade serous carcinoma, consistent with gynecologic origin.   She was initially diagnosed with clinical stage IIIC (T3cN1Mx).  CA125 was 707 on 02/17/2015.  She received 4 cycles of neoadjuvant carboplatin and Taxol (03/05/2015 - 05/22/2015).  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  She had loose stools on oral magnesium.  She was initially on Neurontin then switched Lyrica with cycle #3.  Cycle #4 was notable for neutropenia (ANC 300) requiring GCSF x 3 days.    She underwent exploratory laparotomy, lysis of adhesions, total abdominal hysterectomy with bilateral salpingo-oophorectomy, infracolic omentectomy, optimal tumor debulking(< 1 cm), recto-sigmoid resection with creation of end colostomy, cholecystectomy, mobilization of splenic flexure and liver with diaphragmatic stripping on 06/15/2015. The right diaphragm was cleared of tumor. During dissection, the diaphragm was entered and closed with sutures.  She  received tamoxifen from 02/25/2016 - 03/14/2016.  She received 4 cycles of carboplatin and gemcitabine (03/21/2016 - 05/24/2016) with GCSF/Neulasta support.  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  Cymbalta began on 02/24/2017  PDL-1 testing revealed a combined score was 5 (>=1 positive).     [employer] genetic testing negative for APC, ATM, BMPR1A, BRCA1, BRCA2, BRIP1, CDH1, CDK4, CDKN2A, CHEK2, EPCAM, MLH1, MSH2, MSH6, MUTYH, NBN, PALB2, PMS2, PTEN, RAD51C, RAD51D, SMAD4, STK11, TP53. Sequencing for select regions of POLE and POLD1, and large rearrangement analysis for select regions of GREM1 were negative.  She received 2 cycles of Doxil (06/21/2016 - 07/19/2016) with Neulasta support.  She received 4 cycles of topotecan with Neulasta support (08/22/2016 - 10/10/2016).    She received 3 cycles of pembrolizumab (Keytruda) from 12/06/2016 -  01/09/2017.  Cycle #1 was complicated by neutropenia (ANC 400) requiring GCSF/Granix x 3 days.  She received GCSF x 5 days with cycle #2.  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 Right lower chest pleural metastasis and several of the peritoneal tumor implants had increased.  There was new right inguinal lymphadenopathy.  Some of the peritoneal tumor implants had decreased.  The small dependent left pleural effusion was slightly increased .  The mild left hydroureteronephrosis was improved.  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   The chronic biliary ductal dilatation was stable .  There was no evidence of bowel obstruction.  CA125 was 802.9 on 03/30/2015, 567.9 on 04/13/2015, 168.8 on 05/15/2015, 85.2 on 07/17/2015, 68.6 on 07/28/2015, 34.5 on 09/17/2015, 20.7 on 11/06/2015, 49 on 01/22/2016, 106.1 on 02/22/2016, 138.8 on 03/07/2016, 220.8 on 03/21/2016, 152.8 on 04/11/2016, 125.6 on  04/18/2016, 76.8 on 05/03/2016, 60.2 on 05/24/2016, 44 on 07/19/2016, 65.8 on 08/17/2016, 53.4 on 09/12/2016, 47.3 on 10/10/2016, 91 on 12/20/2016, 141.1 on 01/09/2017, 463.7 on 02/27/2017, 907.1 on 03/27/2017, 192.1 on 05/22/2017, 189.1 on 05/24/2017, and 144.3 on 06/19/2017.  She has a history of recurrent right sided pleural effusion.  She underwent thoracentesis of 650 cc on post-operative day 3.  She was admitted to [facility] on 07/01/2015 and 07/07/2015 for recurrent shortness of breath.  She underwent 2 additional thoracenteses (1.1 L on 07/02/2015 and 850 cc on 07/08/2015).  Cytology was negative x 2.  Bilateral lower extremity duplex on 07/03/2015 was negative.  Echo revealed an EF of 55-60% on 07/08/2015 and 60-65% on 05/27/2016.    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 She was on Lovenox 60 mg twice a day.  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  She has severe rheumatoid arthritis.  Methotrexate and Enbrel were initially on hold.  She has a normocytic anemia.  Work-up on 02/17/2015 and 05/24/2016 revealed a normal ferritin, B12, folate, TSH.  She denies any melena or hematochezia.    She has anemia due to chronic disease. She received 1 unit PRBCs during her admission at [facility]. She denies any melena or hematochezia. She has diabetes and is on an insulin pump.  She has had persistent neutropenia felt secondary to her rheumatoid arthritis.  Folate and MMA were normal.  TSH was 6.13 (high) with a free T4 of 1.17 (0.61-1.12).  She began methotrexate (10 mg a week) and prednisone (5 mg a day) for severe rheumatoid arthritis on 12/17/2015.  She remains on prednisone alone (5 mg a day).  Bone marrow aspirate and biopsy on 06/09/2010 revealed a hypercellular marrow (70%) with no evidence of dysplasia or  malignancy.  Flow cytometry was negative.  Cytogenetics were normal (46,XX).  FISH studies were negative for MDS.  Bone marrow aspirate and biopsy on 12/03/2015 revealed a normocellular to mildy hypercellular marrow for age (40%) with left shifted myelopoiesis, non specific dyserythropoiesis and mild megakaryocytic atypia with no increase in blasts.  There were multiple small nonspecific lymphoid aggregates (favor reactive). There was no increase in reticulin.  There was decreased myeloid cells (37%) with left shifted maturation and 1% atypical myelod blasts.  There was relatively increased monocytic cells (11%), relatively increased lymphoid cells (36%), and relatively increased eosinophils (6%).  Cytogenetics were normal (53, XX).  SNP microarray was normal.  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  She receives IV magnesium twice weekly.    She has chemotherapy induced anemia.  She has received Procrit (last 06/26/2017).  She received 1 unit of PRBCs on 06/05/2017.  Labs on 08/22/2016 revealed a normal B12 and folate.  Ferritin was 201, iron saturation 21% and TIBC 307 on 06/09/2017.  B12 and folate were normal.  Code status is DNR/DNI.  She developed acute LFT elevation on 11/28/2016.  Event was preceded by upper abdominal discomfort.  Hepatitis B core antibody total and hepatitis C antibody were negative.  She received 5 cycles of Abraxane (03/13/2017 - 06/26/2017).  Cycle #1 was  truncated after day 1 secondary to side effects and Thanksgiving.  Chest, abdomen and pelvic CT on 07/10/2017 revealed progression of disease in the abdomen pelvis.  There was interval increase in size of hepatic lesions most noticeably in the LEFT hepatic lobe.  There was interval increase in bilateral hydronephrosis and hydroureter.  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  There was interval increase in size of large peritoneal and  pelvic peritoneal implants.  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  She underwent bilateral ureteral stent placement on 07/13/2017.  She was admitted to Hospice on 07/15/2017.  Symptomatically, she feels good.  Appetite fluctuates.  Weight is down 5 pounds. Exam reveals increasing abdominal nodularity.  Magnesium is 1.3.  Plan: 1. Labs today: CBC with diff, CMP, Mg, TSH. 2. Discuss chronic HYPOmagnesemia. Magnesium refractory to frequent replacement. Magnesium level 1.3. Will replace with 6 grams intravenous magnesium today.   3. Discuss consideration of new oral magnesium product (magnesium glyconate) that does not appear associated with significant diarrhea.  Patient wishes to try.  Discuss contacting clinic if she has diarrhea.  Continue magnesium checks and if working should see decrease in IV magnesium needs. 4. Discuss anemia.  Hemoglobin stable at 10.0.  No transfusion needed at this time.  Check CBC monthly. 5. Discuss ongoing symptom management including pain medications and antiemetics. Continue to coordinate care with Hospice. 6. Encourage caloric intake.  Suspect weight loss due to progressive tumor.  Check TSH. 7. RTC on Mondays and Fridays x 3 weeks for labs (Mg) and +/- Mg.  8. RTC in 1 month for MD assessment and labs (CBC with diff, CMP, Mg), and +/- Mg.    Honor Loh, NP 09/26/17, 8:53 AM   I saw and evaluated the patient, participating in the key portions of the service and reviewing pertinent diagnostic studies and records.  I reviewed the nurse practitioner's note and agree with the findings and the plan.  The assessment and plan were discussed with the patient.  A few questions were asked by the patient and answered.   Nolon Stalls, MD 09/26/17, 8:53 AM

## 2017-09-26 ENCOUNTER — Encounter: Payer: Self-pay | Admitting: Hematology and Oncology

## 2017-09-26 ENCOUNTER — Inpatient Hospital Stay (HOSPITAL_BASED_OUTPATIENT_CLINIC_OR_DEPARTMENT_OTHER): Admitting: Hematology and Oncology

## 2017-09-26 ENCOUNTER — Inpatient Hospital Stay

## 2017-09-26 VITALS — BP 119/68 | HR 90 | Temp 97.0°F | Resp 18 | Wt 119.2 lb

## 2017-09-26 DIAGNOSIS — I252 Old myocardial infarction: Secondary | ICD-10-CM

## 2017-09-26 DIAGNOSIS — R0602 Shortness of breath: Secondary | ICD-10-CM

## 2017-09-26 DIAGNOSIS — Z8673 Personal history of transient ischemic attack (TIA), and cerebral infarction without residual deficits: Secondary | ICD-10-CM

## 2017-09-26 DIAGNOSIS — E785 Hyperlipidemia, unspecified: Secondary | ICD-10-CM

## 2017-09-26 DIAGNOSIS — C786 Secondary malignant neoplasm of retroperitoneum and peritoneum: Secondary | ICD-10-CM

## 2017-09-26 DIAGNOSIS — K219 Gastro-esophageal reflux disease without esophagitis: Secondary | ICD-10-CM

## 2017-09-26 DIAGNOSIS — M069 Rheumatoid arthritis, unspecified: Secondary | ICD-10-CM

## 2017-09-26 DIAGNOSIS — R634 Abnormal weight loss: Secondary | ICD-10-CM

## 2017-09-26 DIAGNOSIS — M129 Arthropathy, unspecified: Secondary | ICD-10-CM

## 2017-09-26 DIAGNOSIS — C569 Malignant neoplasm of unspecified ovary: Secondary | ICD-10-CM

## 2017-09-26 DIAGNOSIS — Z87891 Personal history of nicotine dependence: Secondary | ICD-10-CM

## 2017-09-26 DIAGNOSIS — Z90722 Acquired absence of ovaries, bilateral: Secondary | ICD-10-CM

## 2017-09-26 DIAGNOSIS — I2510 Atherosclerotic heart disease of native coronary artery without angina pectoris: Secondary | ICD-10-CM

## 2017-09-26 DIAGNOSIS — I10 Essential (primary) hypertension: Secondary | ICD-10-CM

## 2017-09-26 DIAGNOSIS — C800 Disseminated malignant neoplasm, unspecified: Principal | ICD-10-CM

## 2017-09-26 DIAGNOSIS — I509 Heart failure, unspecified: Secondary | ICD-10-CM

## 2017-09-26 DIAGNOSIS — E876 Hypokalemia: Secondary | ICD-10-CM

## 2017-09-26 DIAGNOSIS — D6481 Anemia due to antineoplastic chemotherapy: Secondary | ICD-10-CM | POA: Diagnosis not present

## 2017-09-26 DIAGNOSIS — C561 Malignant neoplasm of right ovary: Secondary | ICD-10-CM | POA: Diagnosis not present

## 2017-09-26 DIAGNOSIS — Z66 Do not resuscitate: Secondary | ICD-10-CM

## 2017-09-26 DIAGNOSIS — F329 Major depressive disorder, single episode, unspecified: Secondary | ICD-10-CM

## 2017-09-26 DIAGNOSIS — Z7189 Other specified counseling: Secondary | ICD-10-CM

## 2017-09-26 DIAGNOSIS — Z90710 Acquired absence of both cervix and uterus: Secondary | ICD-10-CM

## 2017-09-26 DIAGNOSIS — Z79899 Other long term (current) drug therapy: Secondary | ICD-10-CM

## 2017-09-26 DIAGNOSIS — E109 Type 1 diabetes mellitus without complications: Secondary | ICD-10-CM

## 2017-09-26 DIAGNOSIS — C801 Malignant (primary) neoplasm, unspecified: Secondary | ICD-10-CM

## 2017-09-26 LAB — COMPREHENSIVE METABOLIC PANEL
ALT: 10 U/L — ABNORMAL LOW (ref 14–54)
AST: 14 U/L — ABNORMAL LOW (ref 15–41)
Albumin: 3.3 g/dL — ABNORMAL LOW (ref 3.5–5.0)
Alkaline Phosphatase: 72 U/L (ref 38–126)
Anion gap: 10 (ref 5–15)
BUN: 24 mg/dL — ABNORMAL HIGH (ref 6–20)
CO2: 27 mmol/L (ref 22–32)
Calcium: 9.9 mg/dL (ref 8.9–10.3)
Chloride: 99 mmol/L — ABNORMAL LOW (ref 101–111)
Creatinine, Ser: 0.78 mg/dL (ref 0.44–1.00)
GFR calc Af Amer: >60 mL/min (ref >60)
GFR calc non Af Amer: >60 mL/min (ref >60)
Glucose, Bld: 195 mg/dL — ABNORMAL HIGH (ref 65–99)
Potassium: 4.2 mmol/L (ref 3.5–5.1)
Sodium: 136 mmol/L (ref 135–145)
Total Bilirubin: 0.3 mg/dL (ref 0.3–1.2)
Total Protein: 7.3 g/dL (ref 6.5–8.1)

## 2017-09-26 LAB — CBC WITH DIFFERENTIAL/PLATELET
Basophils Absolute: 0.1 10*3/uL (ref 0–0.1)
Basophils Relative: 2 %
Eosinophils Absolute: 0 10*3/uL (ref 0–0.7)
Eosinophils Relative: 1 %
HCT: 30.1 % — ABNORMAL LOW (ref 35.0–47.0)
Hemoglobin: 10 g/dL — ABNORMAL LOW (ref 12.0–16.0)
Lymphocytes Relative: 27 %
Lymphs Abs: 0.8 10*3/uL — ABNORMAL LOW (ref 1.0–3.6)
MCH: 26.7 pg (ref 26.0–34.0)
MCHC: 33.1 g/dL (ref 32.0–36.0)
MCV: 80.5 fL (ref 80.0–100.0)
Monocytes Absolute: 0.6 10*3/uL (ref 0.2–0.9)
Monocytes Relative: 18 %
Neutro Abs: 1.6 10*3/uL (ref 1.4–6.5)
Neutrophils Relative %: 52 %
Platelets: 257 10*3/uL (ref 150–440)
RBC: 3.74 MIL/uL — ABNORMAL LOW (ref 3.80–5.20)
RDW: 16.3 % — ABNORMAL HIGH (ref 11.5–14.5)
WBC: 3.1 10*3/uL — ABNORMAL LOW (ref 3.6–11.0)

## 2017-09-26 LAB — MAGNESIUM: Magnesium: 1.3 mg/dL — ABNORMAL LOW (ref 1.7–2.4)

## 2017-09-26 MED ORDER — HEPARIN SOD (PORK) LOCK FLUSH 100 UNIT/ML IV SOLN
500.0000 [IU] | Freq: Once | INTRAVENOUS | Status: AC
  Administered 2017-09-26: 500 [IU] via INTRAVENOUS
  Filled 2017-09-26: qty 5

## 2017-09-26 MED ORDER — SODIUM CHLORIDE 0.9 % IV SOLN
6.0000 g | Freq: Once | INTRAVENOUS | Status: AC
  Administered 2017-09-26: 6 g via INTRAVENOUS
  Filled 2017-09-26: qty 10

## 2017-09-26 MED ORDER — SODIUM CHLORIDE 0.9 % IV SOLN
Freq: Once | INTRAVENOUS | Status: AC
  Administered 2017-09-26: 09:00:00 via INTRAVENOUS
  Filled 2017-09-26: qty 1000

## 2017-09-26 MED ORDER — SODIUM CHLORIDE 0.9% FLUSH
10.0000 mL | INTRAVENOUS | Status: DC | PRN
  Administered 2017-09-26: 10 mL via INTRAVENOUS
  Filled 2017-09-26: qty 10

## 2017-09-26 NOTE — Progress Notes (Signed)
Patient offers no complaints today. 

## 2017-09-27 ENCOUNTER — Telehealth: Payer: Self-pay | Admitting: *Deleted

## 2017-09-27 NOTE — Telephone Encounter (Signed)
Hospice nurse called asking about a Mg+ pill patient was to be given infp about from [facility] and did not receive, also asking if she gets oral Mg+ will she continue with IV Mg+ ? Please return her call [phone]

## 2017-09-27 NOTE — Telephone Encounter (Signed)
Called Toni at [facility] and LVM that the magnesium Mrs. Bowne is asking about is Magnesium Glyconate.  It is an OTC herbal supplement that is sold at [place] [facility] [place].  As far as replacing the IV mag with this supplement it will have to be determined after the patient has taken the oral for a couple of weeks and we see how she reacts to it as far as her levels

## 2017-09-29 ENCOUNTER — Inpatient Hospital Stay

## 2017-09-29 VITALS — BP 113/65 | HR 81 | Temp 97.5°F | Resp 16

## 2017-09-29 DIAGNOSIS — C800 Disseminated malignant neoplasm, unspecified: Principal | ICD-10-CM

## 2017-09-29 DIAGNOSIS — C561 Malignant neoplasm of right ovary: Secondary | ICD-10-CM

## 2017-09-29 DIAGNOSIS — C569 Malignant neoplasm of unspecified ovary: Secondary | ICD-10-CM | POA: Diagnosis not present

## 2017-09-29 LAB — MAGNESIUM: Magnesium: 1.3 mg/dL — ABNORMAL LOW (ref 1.7–2.4)

## 2017-09-29 MED ORDER — SODIUM CHLORIDE 0.9 % IV SOLN
6.0000 g | Freq: Once | INTRAVENOUS | Status: AC
  Administered 2017-09-29: 6 g via INTRAVENOUS
  Filled 2017-09-29: qty 10

## 2017-09-29 MED ORDER — HEPARIN SOD (PORK) LOCK FLUSH 100 UNIT/ML IV SOLN
500.0000 [IU] | Freq: Once | INTRAVENOUS | Status: AC
  Administered 2017-09-29: 500 [IU] via INTRAVENOUS
  Filled 2017-09-29: qty 5

## 2017-09-29 MED ORDER — SODIUM CHLORIDE 0.9% FLUSH
10.0000 mL | INTRAVENOUS | Status: DC | PRN
  Administered 2017-09-29: 10 mL via INTRAVENOUS
  Filled 2017-09-29: qty 10

## 2017-09-29 MED ORDER — SODIUM CHLORIDE 0.9 % IV SOLN
Freq: Once | INTRAVENOUS | Status: AC
  Administered 2017-09-29: 09:00:00 via INTRAVENOUS
  Filled 2017-09-29: qty 1000

## 2017-10-02 ENCOUNTER — Inpatient Hospital Stay: Attending: Hematology and Oncology

## 2017-10-02 ENCOUNTER — Other Ambulatory Visit: Payer: Self-pay

## 2017-10-02 ENCOUNTER — Inpatient Hospital Stay

## 2017-10-02 DIAGNOSIS — Z90722 Acquired absence of ovaries, bilateral: Secondary | ICD-10-CM | POA: Diagnosis not present

## 2017-10-02 DIAGNOSIS — E785 Hyperlipidemia, unspecified: Secondary | ICD-10-CM | POA: Diagnosis not present

## 2017-10-02 DIAGNOSIS — Z9221 Personal history of antineoplastic chemotherapy: Secondary | ICD-10-CM | POA: Insufficient documentation

## 2017-10-02 DIAGNOSIS — I252 Old myocardial infarction: Secondary | ICD-10-CM | POA: Diagnosis not present

## 2017-10-02 DIAGNOSIS — E1140 Type 2 diabetes mellitus with diabetic neuropathy, unspecified: Secondary | ICD-10-CM | POA: Insufficient documentation

## 2017-10-02 DIAGNOSIS — E876 Hypokalemia: Secondary | ICD-10-CM | POA: Insufficient documentation

## 2017-10-02 DIAGNOSIS — C800 Disseminated malignant neoplasm, unspecified: Principal | ICD-10-CM

## 2017-10-02 DIAGNOSIS — F329 Major depressive disorder, single episode, unspecified: Secondary | ICD-10-CM | POA: Insufficient documentation

## 2017-10-02 DIAGNOSIS — D6481 Anemia due to antineoplastic chemotherapy: Secondary | ICD-10-CM | POA: Insufficient documentation

## 2017-10-02 DIAGNOSIS — Z87891 Personal history of nicotine dependence: Secondary | ICD-10-CM | POA: Insufficient documentation

## 2017-10-02 DIAGNOSIS — Z90710 Acquired absence of both cervix and uterus: Secondary | ICD-10-CM | POA: Insufficient documentation

## 2017-10-02 DIAGNOSIS — C561 Malignant neoplasm of right ovary: Secondary | ICD-10-CM | POA: Diagnosis present

## 2017-10-02 DIAGNOSIS — C782 Secondary malignant neoplasm of pleura: Secondary | ICD-10-CM | POA: Diagnosis not present

## 2017-10-02 DIAGNOSIS — J90 Pleural effusion, not elsewhere classified: Secondary | ICD-10-CM | POA: Insufficient documentation

## 2017-10-02 DIAGNOSIS — E109 Type 1 diabetes mellitus without complications: Secondary | ICD-10-CM | POA: Diagnosis not present

## 2017-10-02 DIAGNOSIS — I10 Essential (primary) hypertension: Secondary | ICD-10-CM | POA: Diagnosis not present

## 2017-10-02 DIAGNOSIS — Z79899 Other long term (current) drug therapy: Secondary | ICD-10-CM | POA: Insufficient documentation

## 2017-10-02 DIAGNOSIS — Z933 Colostomy status: Secondary | ICD-10-CM | POA: Insufficient documentation

## 2017-10-02 DIAGNOSIS — I509 Heart failure, unspecified: Secondary | ICD-10-CM | POA: Diagnosis not present

## 2017-10-02 DIAGNOSIS — Z66 Do not resuscitate: Secondary | ICD-10-CM | POA: Insufficient documentation

## 2017-10-02 DIAGNOSIS — D72819 Decreased white blood cell count, unspecified: Secondary | ICD-10-CM | POA: Insufficient documentation

## 2017-10-02 DIAGNOSIS — T451X5S Adverse effect of antineoplastic and immunosuppressive drugs, sequela: Secondary | ICD-10-CM | POA: Diagnosis not present

## 2017-10-02 DIAGNOSIS — R634 Abnormal weight loss: Secondary | ICD-10-CM

## 2017-10-02 DIAGNOSIS — C787 Secondary malignant neoplasm of liver and intrahepatic bile duct: Secondary | ICD-10-CM | POA: Diagnosis not present

## 2017-10-02 DIAGNOSIS — N1330 Unspecified hydronephrosis: Secondary | ICD-10-CM | POA: Insufficient documentation

## 2017-10-02 DIAGNOSIS — Z8673 Personal history of transient ischemic attack (TIA), and cerebral infarction without residual deficits: Secondary | ICD-10-CM | POA: Insufficient documentation

## 2017-10-02 DIAGNOSIS — I2510 Atherosclerotic heart disease of native coronary artery without angina pectoris: Secondary | ICD-10-CM | POA: Insufficient documentation

## 2017-10-02 DIAGNOSIS — M069 Rheumatoid arthritis, unspecified: Secondary | ICD-10-CM | POA: Insufficient documentation

## 2017-10-02 DIAGNOSIS — Z801 Family history of malignant neoplasm of trachea, bronchus and lung: Secondary | ICD-10-CM | POA: Insufficient documentation

## 2017-10-02 LAB — COMPREHENSIVE METABOLIC PANEL
ALT: 11 U/L — ABNORMAL LOW (ref 14–54)
AST: 14 U/L — ABNORMAL LOW (ref 15–41)
Albumin: 3.2 g/dL — ABNORMAL LOW (ref 3.5–5.0)
Alkaline Phosphatase: 66 U/L (ref 38–126)
Anion gap: 8 (ref 5–15)
BUN: 23 mg/dL — ABNORMAL HIGH (ref 6–20)
CO2: 26 mmol/L (ref 22–32)
Calcium: 9.5 mg/dL (ref 8.9–10.3)
Chloride: 101 mmol/L (ref 101–111)
Creatinine, Ser: 0.77 mg/dL (ref 0.44–1.00)
GFR calc Af Amer: >60 mL/min (ref >60)
GFR calc non Af Amer: >60 mL/min (ref >60)
Glucose, Bld: 171 mg/dL — ABNORMAL HIGH (ref 65–99)
Potassium: 3.9 mmol/L (ref 3.5–5.1)
Sodium: 135 mmol/L (ref 135–145)
Total Bilirubin: 0.3 mg/dL (ref 0.3–1.2)
Total Protein: 7 g/dL (ref 6.5–8.1)

## 2017-10-02 LAB — CBC WITH DIFFERENTIAL/PLATELET
Basophils Absolute: 0 10*3/uL (ref 0–0.1)
Basophils Relative: 1 %
Eosinophils Absolute: 0 10*3/uL (ref 0–0.7)
Eosinophils Relative: 2 %
HCT: 28.8 % — ABNORMAL LOW (ref 35.0–47.0)
Hemoglobin: 9.6 g/dL — ABNORMAL LOW (ref 12.0–16.0)
Lymphocytes Relative: 30 %
Lymphs Abs: 0.8 10*3/uL — ABNORMAL LOW (ref 1.0–3.6)
MCH: 26.8 pg (ref 26.0–34.0)
MCHC: 33.4 g/dL (ref 32.0–36.0)
MCV: 80.1 fL (ref 80.0–100.0)
Monocytes Absolute: 0.5 10*3/uL (ref 0.2–0.9)
Monocytes Relative: 17 %
Neutro Abs: 1.4 10*3/uL (ref 1.4–6.5)
Neutrophils Relative %: 50 %
Platelets: 244 10*3/uL (ref 150–440)
RBC: 3.6 MIL/uL — ABNORMAL LOW (ref 3.80–5.20)
RDW: 16.2 % — ABNORMAL HIGH (ref 11.5–14.5)
WBC: 2.8 10*3/uL — ABNORMAL LOW (ref 3.6–11.0)

## 2017-10-02 LAB — MAGNESIUM: Magnesium: 1.3 mg/dL — ABNORMAL LOW (ref 1.7–2.4)

## 2017-10-02 LAB — TSH: TSH: 2.431 u[IU]/mL (ref 0.350–4.500)

## 2017-10-02 MED ORDER — HEPARIN SOD (PORK) LOCK FLUSH 100 UNIT/ML IV SOLN
500.0000 [IU] | Freq: Once | INTRAVENOUS | Status: AC
  Administered 2017-10-02: 500 [IU] via INTRAVENOUS

## 2017-10-02 MED ORDER — HEPARIN SOD (PORK) LOCK FLUSH 100 UNIT/ML IV SOLN
INTRAVENOUS | Status: AC
  Filled 2017-10-02: qty 5

## 2017-10-02 MED ORDER — SODIUM CHLORIDE 0.9 % IV SOLN
6.0000 g | Freq: Once | INTRAVENOUS | Status: AC
  Administered 2017-10-02: 6 g via INTRAVENOUS
  Filled 2017-10-02: qty 12

## 2017-10-06 ENCOUNTER — Other Ambulatory Visit: Payer: Self-pay | Admitting: Urgent Care

## 2017-10-06 ENCOUNTER — Inpatient Hospital Stay

## 2017-10-06 VITALS — BP 116/74 | HR 82 | Temp 97.3°F | Resp 18

## 2017-10-06 DIAGNOSIS — C561 Malignant neoplasm of right ovary: Secondary | ICD-10-CM

## 2017-10-06 DIAGNOSIS — C800 Disseminated malignant neoplasm, unspecified: Principal | ICD-10-CM

## 2017-10-06 LAB — MAGNESIUM: Magnesium: 1.5 mg/dL — ABNORMAL LOW (ref 1.7–2.4)

## 2017-10-06 MED ORDER — SODIUM CHLORIDE 0.9 % IV SOLN
6.0000 g | Freq: Once | INTRAVENOUS | Status: AC
  Administered 2017-10-06: 6 g via INTRAVENOUS
  Filled 2017-10-06: qty 10

## 2017-10-09 ENCOUNTER — Inpatient Hospital Stay

## 2017-10-09 DIAGNOSIS — C561 Malignant neoplasm of right ovary: Secondary | ICD-10-CM

## 2017-10-09 DIAGNOSIS — C800 Disseminated malignant neoplasm, unspecified: Principal | ICD-10-CM

## 2017-10-09 LAB — MAGNESIUM: Magnesium: 1.3 mg/dL — ABNORMAL LOW (ref 1.7–2.4)

## 2017-10-09 MED ORDER — MAGNESIUM SULFATE 50 % IJ SOLN
6.0000 g | Freq: Once | INTRAMUSCULAR | Status: AC
  Administered 2017-10-09: 6 g via INTRAVENOUS
  Filled 2017-10-09: qty 10

## 2017-10-09 MED ORDER — HEPARIN SOD (PORK) LOCK FLUSH 100 UNIT/ML IV SOLN
500.0000 [IU] | Freq: Once | INTRAVENOUS | Status: AC
  Administered 2017-10-09: 500 [IU] via INTRAVENOUS
  Filled 2017-10-09: qty 5

## 2017-10-09 MED ORDER — SODIUM CHLORIDE 0.9% FLUSH
10.0000 mL | Freq: Once | INTRAVENOUS | Status: AC
  Administered 2017-10-09: 10 mL via INTRAVENOUS
  Filled 2017-10-09: qty 10

## 2017-10-09 MED ORDER — HEPARIN SOD (PORK) LOCK FLUSH 100 UNIT/ML IV SOLN: 500.0000 [IU] | Freq: Once | INTRAVENOUS | Status: DC

## 2017-10-11 ENCOUNTER — Inpatient Hospital Stay

## 2017-10-13 ENCOUNTER — Inpatient Hospital Stay

## 2017-10-13 DIAGNOSIS — C561 Malignant neoplasm of right ovary: Secondary | ICD-10-CM

## 2017-10-13 DIAGNOSIS — E1140 Type 2 diabetes mellitus with diabetic neuropathy, unspecified: Secondary | ICD-10-CM | POA: Diagnosis not present

## 2017-10-13 DIAGNOSIS — E876 Hypokalemia: Secondary | ICD-10-CM | POA: Diagnosis not present

## 2017-10-13 DIAGNOSIS — C800 Disseminated malignant neoplasm, unspecified: Principal | ICD-10-CM

## 2017-10-13 DIAGNOSIS — Z66 Do not resuscitate: Secondary | ICD-10-CM | POA: Diagnosis not present

## 2017-10-13 DIAGNOSIS — I252 Old myocardial infarction: Secondary | ICD-10-CM | POA: Diagnosis not present

## 2017-10-13 DIAGNOSIS — E109 Type 1 diabetes mellitus without complications: Secondary | ICD-10-CM | POA: Diagnosis not present

## 2017-10-13 DIAGNOSIS — M069 Rheumatoid arthritis, unspecified: Secondary | ICD-10-CM | POA: Diagnosis not present

## 2017-10-13 DIAGNOSIS — I2510 Atherosclerotic heart disease of native coronary artery without angina pectoris: Secondary | ICD-10-CM | POA: Diagnosis not present

## 2017-10-13 DIAGNOSIS — C787 Secondary malignant neoplasm of liver and intrahepatic bile duct: Secondary | ICD-10-CM | POA: Diagnosis not present

## 2017-10-13 DIAGNOSIS — Z9221 Personal history of antineoplastic chemotherapy: Secondary | ICD-10-CM | POA: Diagnosis not present

## 2017-10-13 DIAGNOSIS — Z79899 Other long term (current) drug therapy: Secondary | ICD-10-CM | POA: Diagnosis not present

## 2017-10-13 DIAGNOSIS — D6481 Anemia due to antineoplastic chemotherapy: Secondary | ICD-10-CM | POA: Diagnosis not present

## 2017-10-13 DIAGNOSIS — C782 Secondary malignant neoplasm of pleura: Secondary | ICD-10-CM | POA: Diagnosis not present

## 2017-10-13 DIAGNOSIS — D72819 Decreased white blood cell count, unspecified: Secondary | ICD-10-CM | POA: Diagnosis not present

## 2017-10-13 DIAGNOSIS — F329 Major depressive disorder, single episode, unspecified: Secondary | ICD-10-CM | POA: Diagnosis not present

## 2017-10-13 DIAGNOSIS — Z90722 Acquired absence of ovaries, bilateral: Secondary | ICD-10-CM | POA: Diagnosis not present

## 2017-10-13 DIAGNOSIS — J90 Pleural effusion, not elsewhere classified: Secondary | ICD-10-CM | POA: Diagnosis not present

## 2017-10-13 DIAGNOSIS — I509 Heart failure, unspecified: Secondary | ICD-10-CM | POA: Diagnosis not present

## 2017-10-13 DIAGNOSIS — N1330 Unspecified hydronephrosis: Secondary | ICD-10-CM | POA: Diagnosis not present

## 2017-10-13 DIAGNOSIS — I10 Essential (primary) hypertension: Secondary | ICD-10-CM | POA: Diagnosis not present

## 2017-10-13 DIAGNOSIS — T451X5S Adverse effect of antineoplastic and immunosuppressive drugs, sequela: Secondary | ICD-10-CM | POA: Diagnosis not present

## 2017-10-13 DIAGNOSIS — Z90710 Acquired absence of both cervix and uterus: Secondary | ICD-10-CM | POA: Diagnosis not present

## 2017-10-13 DIAGNOSIS — E785 Hyperlipidemia, unspecified: Secondary | ICD-10-CM | POA: Diagnosis not present

## 2017-10-13 LAB — MAGNESIUM: Magnesium: 1.2 mg/dL — ABNORMAL LOW (ref 1.7–2.4)

## 2017-10-13 MED ORDER — HEPARIN SOD (PORK) LOCK FLUSH 100 UNIT/ML IV SOLN
500.0000 [IU] | Freq: Once | INTRAVENOUS | Status: AC
  Administered 2017-10-13: 500 [IU] via INTRAVENOUS

## 2017-10-13 MED ORDER — SODIUM CHLORIDE 0.9 % IV SOLN
6.0000 g | Freq: Once | INTRAVENOUS | Status: AC
  Administered 2017-10-13: 6 g via INTRAVENOUS
  Filled 2017-10-13: qty 10

## 2017-10-13 MED ORDER — SODIUM CHLORIDE 0.9 % IV SOLN
INTRAVENOUS | Status: DC
  Administered 2017-10-13: 09:00:00 via INTRAVENOUS
  Filled 2017-10-13: qty 1000

## 2017-10-13 MED ORDER — SODIUM CHLORIDE 0.9% FLUSH
10.0000 mL | Freq: Once | INTRAVENOUS | Status: AC
  Administered 2017-10-13: 10 mL via INTRAVENOUS
  Filled 2017-10-13: qty 10

## 2017-10-16 ENCOUNTER — Inpatient Hospital Stay

## 2017-10-16 VITALS — BP 147/74 | HR 78 | Temp 96.8°F | Resp 18

## 2017-10-16 DIAGNOSIS — C800 Disseminated malignant neoplasm, unspecified: Principal | ICD-10-CM

## 2017-10-16 DIAGNOSIS — C561 Malignant neoplasm of right ovary: Secondary | ICD-10-CM

## 2017-10-16 LAB — MAGNESIUM: Magnesium: 1.5 mg/dL — ABNORMAL LOW (ref 1.7–2.4)

## 2017-10-16 MED ORDER — SODIUM CHLORIDE 0.9% FLUSH
10.0000 mL | INTRAVENOUS | Status: DC | PRN
  Administered 2017-10-16: 10 mL via INTRAVENOUS
  Filled 2017-10-16: qty 10

## 2017-10-16 MED ORDER — SODIUM CHLORIDE 0.9 % IV SOLN
Freq: Once | INTRAVENOUS | Status: AC
  Administered 2017-10-16: 10:00:00 via INTRAVENOUS
  Filled 2017-10-16: qty 1000

## 2017-10-16 MED ORDER — HEPARIN SOD (PORK) LOCK FLUSH 100 UNIT/ML IV SOLN
500.0000 [IU] | Freq: Once | INTRAVENOUS | Status: AC
  Administered 2017-10-16: 500 [IU] via INTRAVENOUS
  Filled 2017-10-16: qty 5

## 2017-10-16 MED ORDER — SODIUM CHLORIDE 0.9 % IV SOLN
6.0000 g | Freq: Once | INTRAVENOUS | Status: AC
  Administered 2017-10-16: 6 g via INTRAVENOUS
  Filled 2017-10-16: qty 2

## 2017-10-16 NOTE — Progress Notes (Signed)
Magnesium: 1.5. [name] Gray, NP, notified and aware. Per NP order: release supportive therapy plan order for Magnesium Sulfate 6g IV and proceed to infuse today.  

## 2017-10-17 ENCOUNTER — Ambulatory Visit: Payer: Medicare Other | Admitting: Urology

## 2017-10-17 ENCOUNTER — Encounter: Payer: Self-pay | Admitting: Urology

## 2017-10-17 ENCOUNTER — Other Ambulatory Visit: Payer: Self-pay | Admitting: Radiology

## 2017-10-17 VITALS — BP 122/57 | HR 88 | Resp 16 | Ht 63.0 in | Wt 121.8 lb

## 2017-10-17 DIAGNOSIS — N1330 Unspecified hydronephrosis: Secondary | ICD-10-CM | POA: Diagnosis not present

## 2017-10-17 NOTE — Progress Notes (Signed)
10/17/2017 7:45 PM   [name] [birth year] [id number]  Referring provider: Leone Haven, MD [address] [place], [place] [address]  Chief Complaint  Patient presents with  . Follow-up    HPI: 72 year old female with progressive stage IV ovarian cancer managed by Dr. Mike Gip who returns to the office today to discuss bilateral ureteral stent exchange.  Please see previous note for details.  She has developed bilateral hydronephrosis secondary to her advanced ovarian cancer.  She underwent uneventful bilateral ureteral stent placement for renal drainage optimization on 07/13/2017.  Bard Optima ureteral stents were placed.  She reports that she is been tolerating the stents extremely well.  She has had some frequency and urgency and occasional gross hematuria but this is rare and tends to happen only when she is somewhat dehydrated.  She has been pleased.  Her creatinine remains stable, most recent creatinine 0.77 on 10/02/2017.  She is currently on home hospice with no further plans for chemotherapy.  She is seen in the cancer center for labs and repletion as needed.  Her energy and quality of life appear to be decent at this point in time.     PMH: Past Medical History:  Diagnosis Date  . Anemia   . Arthritis   . CAD (coronary artery disease)    a. 09/2012 Cath: LM nl, LAD 95p, 75m LCX 937mOM2 50, RCA 100.  . Marland KitchenHF (congestive heart failure) ([facility]  . Cholelithiasis   . Collagen vascular disease (HCC)    Rheumatoid Arthritis.  . Depression   . Environmental and seasonal allergies   . Esophageal stricture   . Exertional shortness of breath   . GERD (gastroesophageal reflux disease)   . H/O hiatal hernia   . Herniated disc   . History of pancytopenia   . Hyperlipidemia   . Hypertension   . Hypokalemia   . Leukopenia 2012   s/p bone marrow biopsy, Dr. PaMa Hillock. Neuropathy   . NSTEMI (non-ST elevated myocardial infarction) ([facility]5/2014   "mild"  (10/18/2012)  . Ovarian cancer ([facility]2016   chemo and hysterectomy  . Pneumonia 2013; 08/2012   "one lung; double" (10/18/2012)  . Rheumatoid arthritis(714.0)   . Stroke ([facility]  . Type I diabetes mellitus ([facility]   "dx'd in 1957" (10/18/2012)  . Ureter obstruction     Surgical History: Past Surgical History:  Procedure Laterality Date  . ABDOMINAL HYSTERECTOMY  06/15/2015   Dx L/S, EXLAP TAH BSO omentectomy RSRx colostomy diaphragm resection stripping  . CARDIAC CATHETERIZATION  10/18/2012   "first one was today" (10/18/2012)  . CATARACT EXTRACTION W/ INTRAOCULAR LENS IMPLANT Right 2010  . CHOLECYSTECTOMY  06/15/2015   combined case with ovarian cancer debulking  . COLON SURGERY    . CORONARY ARTERY BYPASS GRAFT N/A 10/19/2012   Procedure: CORONARY ARTERY BYPASS GRAFTING (CABG);  Surgeon: StMelrose NakayamaMD;  [place] Service: Open Heart Surgery;  Laterality: N/A;  . CYSTOSCOPY W/ RETROGRADES Bilateral 07/13/2017   Procedure: CYSTOSCOPY WITH RETROGRADE PYELOGRAM;  Surgeon: BrHollice EspyMD;  Location: [facility] ORS;  Service: Urology;  Laterality: Bilateral;  . CYSTOSCOPY WITH STENT PLACEMENT Bilateral 07/13/2017   Procedure: CYSTOSCOPY WITH STENT PLACEMENT;  Surgeon: BrHollice EspyMD;  Location: [facility] ORS;  Service: Urology;  Laterality: Bilateral;  . ESOPHAGEAL DILATION     "3 or 4 times" (10/19/2011)  . ESOPHAGOGASTRODUODENOSCOPY  2012   Dr. IfMinna Merritts. OSTOMY    .  OVARY SURGERY     removal  . PERIPHERAL VASCULAR CATHETERIZATION N/A 03/02/2015   Procedure: Glori Luis Cath Insertion;  Surgeon: Algernon Huxley, MD;  Location: [place] [facility];  Service: Cardiovascular;  Laterality: N/A;  . TUBAL LIGATION  1970    Home Medications:  Allergies as of 10/17/2017      Reactions   Codeine Nausea And Vomiting   Latex Rash      Medication List        Accurate as of 10/17/17  7:45 PM. Always use your most recent med list.          Calcium-Vitamin D 600-200 MG-UNIT  tablet Take 1 tablet by mouth 2 (two) times daily.   cholecalciferol 1000 units tablet Commonly known as:  VITAMIN D Take 1,000 Units by mouth daily.   DULoxetine 20 MG capsule Commonly known as:  CYMBALTA Take 1 capsule (20 mg total) by mouth daily.   enoxaparin 60 MG/0.6ML injection Commonly known as:  LOVENOX INJECT 0.6ML (60MG TOTAL) UNDER THE SKINEVERY 12 HOURS   folic acid 1 MG tablet Commonly known as:  FOLVITE TAKE 1 TABLET BY MOUTH ONCE DAILY   insulin lispro 100 UNIT/ML injection Commonly known as:  HUMALOG Inject 0.06 mLs (6 Units total) into the skin daily.   lidocaine-prilocaine cream Commonly known as:  EMLA APPLY 1 APPLICATION TOPICALLY AS NEEDED 1 HOUR PRIOR TO TREATMENT. COVER WITH PRESS N SEAL UNTIL TREATMENT TIME AS DIRECTED   loratadine 10 MG tablet Commonly known as:  CLARITIN Take 1 tablet by mouth daily as needed.   magnesium oxide 400 (241.3 Mg) MG tablet Commonly known as:  MAG-OX TAKE 1 TABLET BY MOUTH ONCE DAILY   metoprolol tartrate 25 MG tablet Commonly known as:  LOPRESSOR Take 0.5 tablets (12.5 mg total) by mouth daily after breakfast.   ondansetron 8 MG tablet Commonly known as:  ZOFRAN TAKE 1 TABLET BY MOUTH EVERY 8 HOURS AS NEEDED FOR NAUSEA AND VOMITING   oxybutynin 5 MG tablet Commonly known as:  DITROPAN Take 1 tablet (5 mg total) by mouth every 8 (eight) hours as needed for bladder spasms.   pantoprazole 40 MG tablet Commonly known as:  PROTONIX TAKE 1 TABLET BY MOUTH TWICE (2) DAILY   potassium chloride SA 20 MEQ tablet Commonly known as:  K-DUR,KLOR-CON TAKE 1 TABLET BY MOUTH ONCE A DAY   predniSONE 5 MG tablet Commonly known as:  DELTASONE Take 5 mg by mouth daily with breakfast.   vitamin C 500 MG tablet Commonly known as:  ASCORBIC ACID Take 500 mg by mouth daily.   zinc gluconate 50 MG tablet Take 50 mg daily by mouth.       Allergies:  Allergies  Allergen Reactions  . Codeine Nausea And Vomiting  .  Latex Rash    Family History: Family History  Problem Relation Age of Onset  . Diabetes Mother   . Arthritis Mother   . Diabetes Father   . Arthritis Father   . Aneurysm Sister        neck  . Cancer Sister        lung    Social History:  reports that she quit smoking about 23 years ago. Her smoking use included cigarettes. She has a 30.00 pack-year smoking history. She has never used smokeless tobacco. She reports that she does not drink alcohol or use drugs.  ROS: UROLOGY Frequent Urination?: No Hard to postpone urination?: No Burning/pain with urination?: Yes Get up at  night to urinate?: No Leakage of urine?: No Urine stream starts and stops?: No Trouble starting stream?: No Do you have to strain to urinate?: No Blood in urine?: Yes Urinary tract infection?: No Sexually transmitted disease?: No Injury to kidneys or bladder?: No Painful intercourse?: No Weak stream?: No Currently pregnant?: No Vaginal bleeding?: No  Gastrointestinal Nausea?: No Vomiting?: No Indigestion/heartburn?: Yes Diarrhea?: Yes Constipation?: No  Constitutional Fever: No Night sweats?: No Weight loss?: No Fatigue?: Yes  Skin Skin rash/lesions?: No Itching?: Yes  Eyes Blurred vision?: No Double vision?: No  Ears/Nose/Throat Sore throat?: No Sinus problems?: No  Hematologic/Lymphatic Swollen glands?: No Easy bruising?: Yes  Cardiovascular Leg swelling?: No Chest pain?: No  Respiratory Cough?: No Shortness of breath?: Yes  Endocrine Excessive thirst?: No  Musculoskeletal Back pain?: Yes Joint pain?: No  Neurological Headaches?: No Dizziness?: No  Psychologic Depression?: No Anxiety?: No  Physical Exam: BP (!) 122/57   Pulse 88   Resp 16   Ht 5' 3"  (1.6 m)   Wt 121 lb 12.8 oz (55.2 kg)   SpO2 96%   BMI 21.58 kg/m   Constitutional:  Alert and oriented, No acute distress.  Accompanied by husband today.  Appears stronger today, pleasant, in good  spirits. HEENT: [place] AT, moist mucus membranes.  Trachea midline, no masses. Respiratory: Normal respiratory effort, no increased work of breathing. GU: No CVA tenderness.  Neurologic: Grossly intact, no focal deficits, moving all 4 extremities. Psychiatric: Normal mood and affect.  Laboratory Data: Lab Results  Component Value Date   WBC 2.8 (L) 10/02/2017   HGB 9.6 (L) 10/02/2017   HCT 28.8 (L) 10/02/2017   MCV 80.1 10/02/2017   PLT 244 10/02/2017    Lab Results  Component Value Date   CREATININE 0.77 10/02/2017   Lab Results  Component Value Date   HGBA1C 6.5 (H) 05/28/2016    Urinalysis N/a  Pertinent Imaging: No new interval imaging  Assessment & Plan:    1. Hydronephrosis of bilateral kidneys Status post bilateral ureteral stent placement which are well tolerated Renal function remains excellent/stable Resolution of hydronephrosis on renal ultrasound 07/20/2017 following stent placement Today, we discussed stent exchange.  We will plan for stent exchange in mid July if she continues to do well.  If there is minimal encrustation, can increase interval of stent exchange to q. 6 months.  Patient is agreeable this plan.  We discussed risk and benefits again in detail today.  She will need a preoperative urine culture.  Hollice Espy, MD  [facility] [address] [place], [place] [address] [phone]

## 2017-10-17 NOTE — H&P (View-Only) (Signed)
10/17/2017 7:45 PM   [name] February 04, 1946 [id number]  Referring provider: Leone Haven, MD [address] [place], [place] [address]  Chief Complaint  Patient presents with  . Follow-up    HPI: 72 year old female with progressive stage IV ovarian cancer managed by Dr. Mike Gip who returns to the office today to discuss bilateral ureteral stent exchange.  Please see previous note for details.  She has developed bilateral hydronephrosis secondary to her advanced ovarian cancer.  She underwent uneventful bilateral ureteral stent placement for renal drainage optimization on 07/13/2017.  Bard Optima ureteral stents were placed.  She reports that she is been tolerating the stents extremely well.  She has had some frequency and urgency and occasional gross hematuria but this is rare and tends to happen only when she is somewhat dehydrated.  She has been pleased.  Her creatinine remains stable, most recent creatinine 0.77 on 10/02/2017.  She is currently on home hospice with no further plans for chemotherapy.  She is seen in the cancer center for labs and repletion as needed.  Her energy and quality of life appear to be decent at this point in time.     PMH: Past Medical History:  Diagnosis Date  . Anemia   . Arthritis   . CAD (coronary artery disease)    a. 09/2012 Cath: LM nl, LAD 95p, 89m LCX 973mOM2 50, RCA 100.  . Marland KitchenHF (congestive heart failure) ([facility]  . Cholelithiasis   . Collagen vascular disease (HCC)    Rheumatoid Arthritis.  . Depression   . Environmental and seasonal allergies   . Esophageal stricture   . Exertional shortness of breath   . GERD (gastroesophageal reflux disease)   . H/O hiatal hernia   . Herniated disc   . History of pancytopenia   . Hyperlipidemia   . Hypertension   . Hypokalemia   . Leukopenia 2012   s/p bone marrow biopsy, Dr. PaMa Hillock. Neuropathy   . NSTEMI (non-ST elevated myocardial infarction) ([facility]/2014   "mild"  (10/18/2012)  . Ovarian cancer ([facility]   chemo and hysterectomy  . Pneumonia 2013; 08/2012   "one lung; double" (10/18/2012)  . Rheumatoid arthritis(714.0)   . Stroke ([facility]  . Type I diabetes mellitus ([facility]   "dx'd in 1957" (10/18/2012)  . Ureter obstruction     Surgical History: Past Surgical History:  Procedure Laterality Date  . ABDOMINAL HYSTERECTOMY  06/15/2015   Dx L/S, EXLAP TAH BSO omentectomy RSRx colostomy diaphragm resection stripping  . CARDIAC CATHETERIZATION  10/18/2012   "first one was today" (10/18/2012)  . CATARACT EXTRACTION W/ INTRAOCULAR LENS IMPLANT Right 2010  . CHOLECYSTECTOMY  06/15/2015   combined case with ovarian cancer debulking  . COLON SURGERY    . CORONARY ARTERY BYPASS GRAFT N/A 10/19/2012   Procedure: CORONARY ARTERY BYPASS GRAFTING (CABG);  Surgeon: StMelrose NakayamaMD;  [facility] Service: Open [facility] Surgery;  Laterality: N/A;  . CYSTOSCOPY W/ RETROGRADES Bilateral 07/13/2017   Procedure: CYSTOSCOPY WITH RETROGRADE PYELOGRAM;  Surgeon: BrHollice EspyMD;  Location: [facility];  Service: Urology;  Laterality: Bilateral;  . CYSTOSCOPY WITH STENT PLACEMENT Bilateral 07/13/2017   Procedure: CYSTOSCOPY WITH STENT PLACEMENT;  Surgeon: BrHollice EspyMD;  Location: [facility];  Service: Urology;  Laterality: Bilateral;  . ESOPHAGEAL DILATION     "3 or 4 times" (10/19/2011)  . ESOPHAGOGASTRODUODENOSCOPY  2012   Dr. IfMinna Merritts. OSTOMY    .  OVARY SURGERY     removal  . PERIPHERAL VASCULAR CATHETERIZATION N/A 03/02/2015   Procedure: Glori Luis Cath Insertion;  Surgeon: Algernon Huxley, MD;  Location: [facility];  Service: Cardiovascular;  Laterality: N/A;  . TUBAL LIGATION  1970    Home Medications:  Allergies as of 10/17/2017      Reactions   Codeine Nausea And Vomiting   Latex Rash      Medication List        Accurate as of 10/17/17  7:45 PM. Always use your most recent med list.          Calcium-Vitamin D 600-200 MG-UNIT  tablet Take 1 tablet by mouth 2 (two) times daily.   cholecalciferol 1000 units tablet Commonly known as:  VITAMIN D Take 1,000 Units by mouth daily.   DULoxetine 20 MG capsule Commonly known as:  CYMBALTA Take 1 capsule (20 mg total) by mouth daily.   enoxaparin 60 MG/0.6ML injection Commonly known as:  LOVENOX INJECT 0.6ML (60MG TOTAL) UNDER THE SKINEVERY 12 HOURS   folic acid 1 MG tablet Commonly known as:  FOLVITE TAKE 1 TABLET BY MOUTH ONCE DAILY   insulin lispro 100 UNIT/ML injection Commonly known as:  HUMALOG Inject 0.06 mLs (6 Units total) into the skin daily.   lidocaine-prilocaine cream Commonly known as:  EMLA APPLY 1 APPLICATION TOPICALLY AS NEEDED 1 HOUR PRIOR TO TREATMENT. COVER WITH PRESS N SEAL UNTIL TREATMENT TIME AS DIRECTED   loratadine 10 MG tablet Commonly known as:  CLARITIN Take 1 tablet by mouth daily as needed.   magnesium oxide 400 (241.3 Mg) MG tablet Commonly known as:  MAG-OX TAKE 1 TABLET BY MOUTH ONCE DAILY   metoprolol tartrate 25 MG tablet Commonly known as:  LOPRESSOR Take 0.5 tablets (12.5 mg total) by mouth daily after breakfast.   ondansetron 8 MG tablet Commonly known as:  ZOFRAN TAKE 1 TABLET BY MOUTH EVERY 8 HOURS AS NEEDED FOR NAUSEA AND VOMITING   oxybutynin 5 MG tablet Commonly known as:  DITROPAN Take 1 tablet (5 mg total) by mouth every 8 (eight) hours as needed for bladder spasms.   pantoprazole 40 MG tablet Commonly known as:  PROTONIX TAKE 1 TABLET BY MOUTH TWICE (2) DAILY   potassium chloride SA 20 MEQ tablet Commonly known as:  K-DUR,KLOR-CON TAKE 1 TABLET BY MOUTH ONCE A DAY   predniSONE 5 MG tablet Commonly known as:  DELTASONE Take 5 mg by mouth daily with breakfast.   vitamin C 500 MG tablet Commonly known as:  ASCORBIC ACID Take 500 mg by mouth daily.   zinc gluconate 50 MG tablet Take 50 mg daily by mouth.       Allergies:  Allergies  Allergen Reactions  . Codeine Nausea And Vomiting  .  Latex Rash    Family History: Family History  Problem Relation Age of Onset  . Diabetes Mother   . Arthritis Mother   . Diabetes Father   . Arthritis Father   . Aneurysm Sister        neck  . Cancer Sister        lung    Social History:  reports that she quit smoking about 23 years ago. Her smoking use included cigarettes. She has a 30.00 pack-year smoking history. She has never used smokeless tobacco. She reports that she does not drink alcohol or use drugs.  ROS: UROLOGY Frequent Urination?: No Hard to postpone urination?: No Burning/pain with urination?: Yes Get up at  night to urinate?: No Leakage of urine?: No Urine stream starts and stops?: No Trouble starting stream?: No Do you have to strain to urinate?: No Blood in urine?: Yes Urinary tract infection?: No Sexually transmitted disease?: No Injury to kidneys or bladder?: No Painful intercourse?: No Weak stream?: No Currently pregnant?: No Vaginal bleeding?: No  Gastrointestinal Nausea?: No Vomiting?: No Indigestion/heartburn?: Yes Diarrhea?: Yes Constipation?: No  Constitutional Fever: No Night sweats?: No Weight loss?: No Fatigue?: Yes  Skin Skin rash/lesions?: No Itching?: Yes  Eyes Blurred vision?: No Double vision?: No  Ears/Nose/Throat Sore throat?: No Sinus problems?: No  Hematologic/Lymphatic Swollen glands?: No Easy bruising?: Yes  Cardiovascular Leg swelling?: No Chest pain?: No  Respiratory Cough?: No Shortness of breath?: Yes  Endocrine Excessive thirst?: No  Musculoskeletal Back pain?: Yes Joint pain?: No  Neurological Headaches?: No Dizziness?: No  Psychologic Depression?: No Anxiety?: No  Physical Exam: BP (!) 122/57   Pulse 88   Resp 16   Ht 5' 3"  (1.6 m)   Wt 121 lb 12.8 oz (55.2 kg)   SpO2 96%   BMI 21.58 kg/m   Constitutional:  Alert and oriented, No acute distress.  Accompanied by husband today.  Appears stronger today, pleasant, in good  spirits. HEENT: [place] AT, moist mucus membranes.  Trachea midline, no masses. Respiratory: Normal respiratory effort, no increased work of breathing. GU: No CVA tenderness.  Neurologic: Grossly intact, no focal deficits, moving all 4 extremities. Psychiatric: Normal mood and affect.  Laboratory Data: Lab Results  Component Value Date   WBC 2.8 (L) 10/02/2017   HGB 9.6 (L) 10/02/2017   HCT 28.8 (L) 10/02/2017   MCV 80.1 10/02/2017   PLT 244 10/02/2017    Lab Results  Component Value Date   CREATININE 0.77 10/02/2017   Lab Results  Component Value Date   HGBA1C 6.5 (H) 05/28/2016    Urinalysis N/a  Pertinent Imaging: No new interval imaging  Assessment & Plan:    1. Hydronephrosis of bilateral kidneys Status post bilateral ureteral stent placement which are well tolerated Renal function remains excellent/stable Resolution of hydronephrosis on renal ultrasound 07/20/2017 following stent placement Today, we discussed stent exchange.  We will plan for stent exchange in mid July if she continues to do well.  If there is minimal encrustation, can increase interval of stent exchange to q. 6 months.  Patient is agreeable this plan.  We discussed risk and benefits again in detail today.  She will need a preoperative urine culture.  Hollice Espy, MD  [facility] [address], [place], [place] [address] [phone]

## 2017-10-18 ENCOUNTER — Telehealth: Payer: Self-pay | Admitting: Cardiovascular Disease

## 2017-10-18 NOTE — Telephone Encounter (Signed)
Routing to Dr Gollan. 

## 2017-10-18 NOTE — Telephone Encounter (Signed)
° °   [facility] Pre-operative Risk Assessment    Request for surgical clearance:  1. What type of surgery is being performed? BL Ureteral stent exchange    2. When is this surgery scheduled?  11-13-17  3. What type of clearance is required (medical clearance vs. Pharmacy clearance to hold med vs. Both)? medical  4. Are there any medications that need to be held prior to surgery and how long? Not noted    5. Practice name and name of physician performing surgery?  [facility]   6. What is your office phone number [phone]   7.   What is your office fax number [phone]   8.   Anesthesia type (None, local, MAC, general) ? Not noted    Clarisse Gouge 10/18/2017, 10:49 AM  _________________________________________________________________   (provider comments below)

## 2017-10-20 ENCOUNTER — Other Ambulatory Visit: Payer: Self-pay | Admitting: *Deleted

## 2017-10-20 ENCOUNTER — Inpatient Hospital Stay

## 2017-10-20 VITALS — BP 129/68 | HR 83 | Temp 97.5°F | Resp 18

## 2017-10-20 DIAGNOSIS — C786 Secondary malignant neoplasm of retroperitoneum and peritoneum: Secondary | ICD-10-CM

## 2017-10-20 DIAGNOSIS — C561 Malignant neoplasm of right ovary: Secondary | ICD-10-CM

## 2017-10-20 DIAGNOSIS — C800 Disseminated malignant neoplasm, unspecified: Principal | ICD-10-CM

## 2017-10-20 DIAGNOSIS — C801 Malignant (primary) neoplasm, unspecified: Principal | ICD-10-CM

## 2017-10-20 LAB — MAGNESIUM: Magnesium: 1.5 mg/dL — ABNORMAL LOW (ref 1.7–2.4)

## 2017-10-20 MED ORDER — HEPARIN SOD (PORK) LOCK FLUSH 100 UNIT/ML IV SOLN
500.0000 [IU] | Freq: Once | INTRAVENOUS | Status: AC
  Administered 2017-10-20: 500 [IU] via INTRAVENOUS

## 2017-10-20 MED ORDER — SODIUM CHLORIDE 0.9 % IV SOLN
6.0000 g | Freq: Once | INTRAVENOUS | Status: AC
  Administered 2017-10-20: 6 g via INTRAVENOUS
  Filled 2017-10-20: qty 10

## 2017-10-20 MED ORDER — SODIUM CHLORIDE 0.9 % IV SOLN
Freq: Once | INTRAVENOUS | Status: AC
  Administered 2017-10-20: 09:00:00 via INTRAVENOUS
  Filled 2017-10-20: qty 1000

## 2017-10-20 NOTE — Progress Notes (Signed)
Magnesium: 1.5. Honor Loh, NP, notified and aware. Per NP order: release supportive therapy plan order for Magnesium Sulfate 6g IV and proceed to infuse today.

## 2017-10-21 NOTE — Progress Notes (Signed)
[facility] day:  10/23/17  Chief Complaint: [name] is a 72 y.o. female with stage IV ovarian cancer who is seen for 4 week assessment.  HPI:  The patient was last seen in the medical oncology clinic on 09/26/2017.  At that time, patient was doing well. She denied any acute concerns. Appetite was labile. She had lost 5 pounds. TSH normal at 2.431 uIU/mL.  No nausea, vomiting, or changes to her bowel habits. She noted stable neuropathy. Exam revealed increasing abdominal nodularity.  Magnesium was 1.3, for which she received 6 grams of intravenous magnesium replacement.   Since she was in the clinic last, patient has tried an over the counter magnesium glycinate supplement. Phone communication with monitoring hospice team indicates that patient has had some associated loose stools. She has continued to return to the clinic twice weekly (Mondays and Fridays)  for lab monitoring and electrolyte replacement.  Despite addition of oral magnesium supplement, patient with chronic refractory HYPOmagnesemia, which has required 6 grams of intravenous magnesium replacement at each visit.  She was seen in follow up consult on 10/17/2017 by Dr. Hollice Espy. Notes reviewed. Patient was noted to be tolerating her ureteral stents remarkably well. Her creatinine had improved. Patient was experiencing rare urinary symptoms that seemed to be provoked by decreased oral fluid intake. Overall quality of life felt to have been improved with stent placement. Stent exchange was discussed. If patient continues to do well, stents are scheduled to be replaced on 11/13/2017. Of note, Dr. Erlene Quan mentioned that if stent encrustation is found to be minimal, exchange frequency will be extended to every 6 months.   Treatment planning, and frequent goals of care evaluations, continue to be coordinated between [facility] [place] oncology team and the patient's interdisciplinary hospice  team.   In the interim, patient notes that she experienced an episode of fever (tmax 100.1) and dry heaves on Friday. She was having increased diarrhea. Patient felt better on Saturday. On Sunday, patient was having chills. She drank some hot chocolate, which caused her to vomit. Patient ran a low grade fever for most of the day on Sunday. The only variable that patient can identify is she stopped the oral magnesium on Saturday. Patient feeling better this morning. She has appreciable gross hematuria.    Patient advises that she maintains an adequate appetite. She is eating well. Weight today is 121 lb 12.8 oz (55.2 kg), which compared to her last visit to the clinic, represents a 2 pound increase.   Patient denies pain in the clinic today.   Past Medical History:  Diagnosis Date  . Anemia   . Arthritis   . CAD (coronary artery disease)    a. 09/2012 Cath: LM nl, LAD 95p, 51m LCX 913mOM2 50, RCA 100.  . Marland KitchenHF (congestive heart failure) ([facility]  . Cholelithiasis   . Collagen vascular disease (HCC)    Rheumatoid Arthritis.  . Depression   . Environmental and seasonal allergies   . Esophageal stricture   . Exertional shortness of breath   . GERD (gastroesophageal reflux disease)   . H/O hiatal hernia   . Herniated disc   . History of pancytopenia   . Hyperlipidemia   . Hypertension   . Hypokalemia   . Leukopenia 2012   s/p bone marrow biopsy, Dr. PaMa Hillock. Neuropathy   . NSTEMI (non-ST elevated myocardial infarction) ([place]/2014   "mild" (10/18/2012)  . Ovarian cancer ([facility]  2016   chemo and hysterectomy  . Pneumonia 2013; 08/2012   "one lung; double" (10/18/2012)  . Rheumatoid arthritis(714.0)   . Stroke ([place])   . Type I diabetes mellitus ([place])    "dx'd in 1957" (10/18/2012)  . Ureter obstruction     Past Surgical History:  Procedure Laterality Date  . ABDOMINAL HYSTERECTOMY  06/15/2015   Dx L/S, EXLAP TAH BSO omentectomy RSRx colostomy diaphragm resection stripping  .  CARDIAC CATHETERIZATION  10/18/2012   "first one was today" (10/18/2012)  . CATARACT EXTRACTION W/ INTRAOCULAR LENS IMPLANT Right 2010  . CHOLECYSTECTOMY  06/15/2015   combined case with ovarian cancer debulking  . COLON SURGERY    . CORONARY ARTERY BYPASS GRAFT N/A 10/19/2012   Procedure: CORONARY ARTERY BYPASS GRAFTING (CABG);  Surgeon: Melrose Nakayama, MD;  Location: [facility];  Service: Open Heart Surgery;  Laterality: N/A;  . CYSTOSCOPY W/ RETROGRADES Bilateral 07/13/2017   Procedure: CYSTOSCOPY WITH RETROGRADE PYELOGRAM;  Surgeon: Hollice Espy, MD;  Location: [facility];  Service: Urology;  Laterality: Bilateral;  . CYSTOSCOPY WITH STENT PLACEMENT Bilateral 07/13/2017   Procedure: CYSTOSCOPY WITH STENT PLACEMENT;  Surgeon: Hollice Espy, MD;  Location: [facility];  Service: Urology;  Laterality: Bilateral;  . ESOPHAGEAL DILATION     "3 or 4 times" (10/19/2011)  . ESOPHAGOGASTRODUODENOSCOPY  2012   Dr. Minna Merritts  . OSTOMY    . OVARY SURGERY     removal  . PERIPHERAL VASCULAR CATHETERIZATION N/A 03/02/2015   Procedure: Glori Luis Cath Insertion;  Surgeon: Algernon Huxley, MD;  Location: [facility];  Service: Cardiovascular;  Laterality: N/A;  . TUBAL LIGATION  1970    Family History  Problem Relation Age of Onset  . Diabetes Mother   . Arthritis Mother   . Diabetes Father   . Arthritis Father   . Aneurysm Sister        neck  . Cancer Sister        lung    Social History:  reports that she quit smoking about 23 years ago. Her smoking use included cigarettes. She has a 30.00 pack-year smoking history. She has never used smokeless tobacco. She reports that she does not drink alcohol or use drugs.  Patient is a retired Art therapist. Patient denies exposure to radiation and toxins. She spends every Thursday afternoon with her sister.  Her son is [name] and daughter is [name].  She is accompanied by her husband, [name], today.  Allergies:  Allergies  Allergen Reactions  . Codeine  Nausea And Vomiting  . Latex Rash    Current Medications: Current Outpatient Medications  Medication Sig Dispense Refill  . Calcium-Vitamin D 600-200 MG-UNIT tablet Take 1 tablet by mouth 2 (two) times daily.     . cholecalciferol (VITAMIN D) 1000 units tablet Take 1,000 Units by mouth daily.    Marland Kitchen enoxaparin (LOVENOX) 60 MG/0.6ML injection INJECT 0.6ML (60MG TOTAL) UNDER THE SKINEVERY 12 HOURS 36 mL 1  . folic acid (FOLVITE) 1 MG tablet TAKE 1 TABLET BY MOUTH ONCE DAILY 90 tablet 3  . insulin lispro (HUMALOG) 100 UNIT/ML injection Inject 0.06 mLs (6 Units total) into the skin daily. (Patient taking differently: Inject 0-85 Units into the skin daily. Via insulin pump) 30 mL 3  . lidocaine-prilocaine (EMLA) cream APPLY 1 APPLICATION TOPICALLY AS NEEDED 1 HOUR PRIOR TO TREATMENT. COVER WITH PRESS N SEAL UNTIL TREATMENT TIME AS DIRECTED 30 g 1  . loratadine (CLARITIN) 10 MG tablet Take 1 tablet by  mouth daily as needed.    . magnesium oxide (MAG-OX) 400 (241.3 Mg) MG tablet TAKE 1 TABLET BY MOUTH ONCE DAILY 30 tablet 1  . metoprolol tartrate (LOPRESSOR) 25 MG tablet Take 0.5 tablets (12.5 mg total) by mouth daily after breakfast. 45 tablet 3  . ondansetron (ZOFRAN) 8 MG tablet TAKE 1 TABLET BY MOUTH EVERY 8 HOURS AS NEEDED FOR NAUSEA AND VOMITING 30 tablet 1  . pantoprazole (PROTONIX) 40 MG tablet TAKE 1 TABLET BY MOUTH TWICE (2) DAILY 180 tablet 1  . potassium chloride SA (K-DUR,KLOR-CON) 20 MEQ tablet TAKE 1 TABLET BY MOUTH ONCE A DAY 90 tablet 1  . predniSONE (DELTASONE) 5 MG tablet Take 5 mg by mouth daily with breakfast.    . vitamin C (ASCORBIC ACID) 500 MG tablet Take 500 mg by mouth daily.    Marland Kitchen zinc gluconate 50 MG tablet Take 50 mg daily by mouth.    . DULoxetine (CYMBALTA) 20 MG capsule Take 1 capsule (20 mg total) by mouth daily. 90 capsule 2  . oxybutynin (DITROPAN) 5 MG tablet Take 1 tablet (5 mg total) by mouth every 8 (eight) hours as needed for bladder spasms. (Patient not taking:  Reported on 10/23/2017) 30 tablet 2   No current facility-administered medications for this visit.    Facility-Administered Medications Ordered in Other Visits  Medication Dose Route Frequency Provider Last Rate Last Dose  . 0.9 %  sodium chloride infusion   Intravenous Continuous Lequita Asal, MD   Stopped at 04/24/17 1140  . heparin lock flush 100 unit/mL  500 Units Intravenous Once Corcoran, Melissa C, MD      . magnesium sulfate IVPB 4 g 100 mL  4 g Intravenous Once Faythe Casa E, NP      . sodium chloride 0.9 % injection 10 mL  10 mL Intracatheter PRN Corcoran, Melissa C, MD      . sodium chloride 0.9 % injection 10 mL  10 mL Intravenous PRN Lequita Asal, MD   10 mL at 04/13/15 0852  . sodium chloride flush (NS) 0.9 % injection 10 mL  10 mL Intravenous PRN Corcoran, Melissa C, MD      . sodium chloride flush (NS) 0.9 % injection 10 mL  10 mL Intravenous PRN Lequita Asal, MD   10 mL at 10/23/17 0840    Review of Systems  Constitutional: Positive for fever (see HPI) and malaise/fatigue. Negative for diaphoresis and weight loss (weight up 2 pounds).       "I feel better today".   HENT: Negative.   Eyes: Negative.   Respiratory: Negative for cough, hemoptysis, sputum production and shortness of breath.   Cardiovascular: Negative for chest pain, palpitations, orthopnea, leg swelling and PND.  Gastrointestinal: Positive for diarrhea, nausea and vomiting (see HPI). Negative for abdominal pain, blood in stool, constipation and melena.  Genitourinary: Positive for dysuria and hematuria. Negative for frequency and urgency.  Musculoskeletal: Positive for joint pain (severe RA). Negative for back pain, falls and myalgias.  Skin: Negative for itching and rash.  Neurological: Positive for tingling (chronic neuropathy in hands and feet; stable). Negative for dizziness, tremors, weakness and headaches.  Endo/Heme/Allergies: Does not bruise/bleed easily.       Diabetes - on  insulin pump  Psychiatric/Behavioral: Negative for depression, memory loss and suicidal ideas. The patient is not nervous/anxious and does not have insomnia.   All other systems reviewed and are negative.  Performance status (ECOG): 2 - Symptomatic, <50% confined  to bed   Physical Exam:  Blood pressure 120/66, pulse 96, temperature 97.8 F (36.6 C), temperature source Tympanic, resp. rate 18, weight 121 lb 12.8 oz (55.2 kg). GENERAL:  Well developed, well nourished, woman sitting comfortably in the exam room in no acute distress. MENTAL STATUS:  Alert and oriented to person, place and time. HEAD: Wearing a pink and yellow cap.  Short gray hair.  Normocephalic, atraumatic, face symmetric, no Cushingoid features. EYES:  Glasses.  Blue eyes.  Pupils equal round and reactive to light and accomodation.  No conjunctivitis or scleral icterus. ENT:  Oropharynx clear without lesion.  Tongue normal. Mucous membranes moist.  RESPIRATORY:  Decreased breath sounds at the bases (right > left).  Crackles RLL. No wheezes or rhonchi. CARDIOVASCULAR:  Regular rate and rhythm without murmur, rub or gallop. ABDOMEN:  Increased palpable abdominal/peritoneal nodularity.  Soft, slightly tender in epigastric region without guarding or rebound tenderness.  Active bowel sounds, and no hepatosplenomegaly.  Ostomy. SKIN:  No rashes, ulcers or lesions. EXTREMITIES: No edema, no skin discoloration or tenderness.  No palpable cords. LYMPH NODES: No palpable cervical, supraclavicular, axillary or inguinal adenopathy  NEUROLOGICAL: Unremarkable. PSYCH:  Appropriate.    Radiology studies: 02/13/2015:  Abdomen and pelvic CT revealed bilateral mass-like adnexal regions (right adnexal mass 5.6 x 5.0 cm and the left adnexa mass 10.3 x 6.0 x 9.9 cm).  There was a large amount of soft tissue throughout the peritoneal cavity involving the omentum and other peritoneal surfaces.  There was a small volume ascites. There was a  peripheral 3.3 x 1.9 cm low-attenuation lesion overlying the right lobe of the liver, likely representing a serosal implant. There was 1.4 x 2.2 cm ill-defined peripheral lesion within the inferior aspect of segment 6 adjacent to the ampulla in the duodenum.  04/28/2015:  Abdominal and pelvic CT revealed decreasing bilateral ovarian masses.   The left adnexal mass measured 4.0 x 6.1 cm (previously 5.0 x 8.5 cm). The right ovary measured 3.8 x 4.9 cm (previously 4.7 x 5.6 cm).  There was improved peritoneal carcinomatosis.  There was a small amount of ascites.  The hepatic dome lesion was stable. The previously seen right hepatic lobe lesion was not well-visualized.  There was a small left pleural effusion and trace right pleural effusion.  The nodular lesion at the ampulla of Vater, extending into the duodenum was stable.  There was no biliary ductal dilatation. 08/01/2015:  Right upper extremity ultrasound revealed a near occlusive thrombus within the central portion of the right internal jugular vein and central portion of the right subclavian vein. 09/01/2015:  Chest, abdomen, and pelvic CT revealed continued decrease in perihepatic fluid collection contiguous with the right pleural space with percutaneous drain.  09/11/2015:  Chest, abdomen, and pelvic CT revealed a residual versus recurrent fluid collection posterior to the right hepatic lobe (2.6 x 1.1 x 4.0 cm).   10/13/2015:  Chest, abdomen, and pelvic CT revealed resolution of the empyema. 02/15/2016:  Chest, abdomen, and pelvic CT revealed multiple soft tissue nodules throughout the pelvis, small bowel mesenteric and peritoneum and, concerning for peritoneal metastatic disease.  There was soft tissue irregularity within the left upper quadrant.  There was mild right hydronephrosis (etiology unclear).  There was interval resolution of previously described right pleural based fluid and gas collection. There was a pleural based nodule within the right  lower hemi-thorax concerning for pleural based metastasis.  There were small bilateral pleural effusions.  There was pulmonary nodularity, predominately  within the left upper lobe (metastatic or infectious/inflammatory etiology).  There was slightly increased mediastinal adenopathy (infectious/inflammatory or metastatic).  There was a low attenuation lesion within the left hepatic lobe (complicated fluid within the fissure or metastatic disease). 03/08/2016:  Abdomen and pelvic CT revealed mild progression of peritoneal disease in the abdomen.  There was interval increase in loculated fluid around the lateral segment left liver, stomach, and spleen.  There was persistent soft tissue lesion at the level of the ampulla with mild intra and extrahepatic biliary duct dilatation.  There was persistent intrahepatic and capsular metastatic disease involving the liver. 05/26/2016:  Head MRI revealed multiple small areas of acute infarct involving the occipital parietal lobe bilaterally and the left lateral cerebellum,  consistent with posterior circulation emboli.  06/10/2016:  Adomen and pelvic CT revealed progression of peritoneal carcinomatosis predominantly in the perihepatic space, bilateral lower quadrants and pelvis.  There was worsening bilateral obstructive uropathy due to malignant involvement of the pelvic ureters.  There was stable small pleural/subpleural nodules at the right lung base.  There was stable small left pleural effusion.  There was decreased small volume perihepatic ascites.  08/09/2016:  Abdomen and pelvic CT revealed interval increase and loculated fluid collections within the peritoneal space consistent with progression of peritoneal ovarian carcinoma metastasis.  There was one large 7 cm collection in the RIGHT lower quadrant which had increased significantly in size and may represent of point of small bowel obstruction.  The proximal stomach and duodenum were decompressed. The mid small  bowel was mildly dilated. There was concern for obstruction given the large intraperitoneal fluid collection.  There was increase in subcapsular hepatic metastatic fluid collections.  There was enlargement of rounded ampullary lesion in the second portion duodenum concerning for metastatic lesion.  There was mild biliary duct dilatation (stable).  There was nodular pleural metastasis in the RIGHT lower lobe pleural space. 11/03/2016:  Abdomen and pelvic CT revealed multifocal cystic metastases in the abdomen/pelvis, reflecting peritoneal disease.  The dominant lesion in the left anterior abdomen has progressed, while additional lesions were mixed.  Peritoneal disease along the liver and spleen progressed.  Pleural-based metastases along the posterior right hemithorax progressed. There were trace left pleural effusion.  There was a stable 1.7 cm duodenal lesion at the ampulla. 11/29/2016:  Abdomen and pelvic CT revealed mild interval enlargement of pleural metastasis the RIGHT lower lobe.  There was overall mild decrease in cystic peritoneal metastasis within the abdomen pelvis. The solid lesion at the terminal ileum was decreased in size. Subcapsular lesion in the liver was slightly decreased in size.  There was no evidence of disease progression the abdomen pelvis.  There was mild intrahepatic duct dilatation similar prior.  The ampullary lesion was slightly larger.  There was mild hydronephrosis and hydroureter on the LEFT likely related to cystic metastasis at the LEFT vesicoureteral junction 12/09/2016:  MRCP revealed the cystic lesion within the pancreatic head/uncinate process had decreased in size and demonstrated no suspicious characteristics. This can be presumed a pseudocyst or focus of side branch duct ectasia.  There was similar to slight progression of extensive peritoneal metastasis since 11/29/2016. Grossly similar abdominal nodal and right pleural metastasis.  There was chronic mild common duct  dilatation with similar soft tissue fullness about the ampulla compared to 11/29/2016.  There was improved to resolved left-sided hydronephrosis. 01/20/2017:  Abdomen and pelvic CT revealed complex mixed interval changes in widespread metastatic disease in the peritoneal cavity, lower chest and thoracic, abdominal  and pelvic lymph nodes. Right lower chest pleural metastasis and several of the peritoneal tumor implants had increased.  There was new right inguinal lymphadenopathy.  Some of the peritoneal tumor implants had decreased.  The small dependent left pleural effusion was slightly increased .  The mild left hydroureteronephrosis was improved.  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   The chronic biliary ductal dilatation was stable .  There was no evidence of bowel obstruction. 05/22/2017:  Chest, abdomen, and pelvic CT revealed  significant enlargement of metastatic disease involving the right pleura, low paratracheal nodal chain, omentum, peritoneal surfaces, mesentery, and liver. Tumors extending through the ostomy site into the subcutaneous tissues. There were multiple new metastatic liver and right pleural lesions.  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  There was new sclerotic lesion in the T11 vertebral body, concerning for possible metastatic disease. There was healing anterior nondisplaced rib fractures of the right second through ninth ribs that appeared new. 07/10/2017:  Chest, abdomen and pelvic CT revealed RIGHT lower lobe pleural-based nodules, slightly smaller.  There was stable bilateral pleural effusions (left > right) and stable mediastinal adenopathy.  There was evidence of progression of disease in the abdomen pelvis.  There was interval increase in size of hepatic lesions most noticeably in the LEFT hepatic lobe.  There was interval increase in bilateral  hydronephrosis and hydroureter.  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  There was interval increase in size of large peritoneal and pelvic peritoneal implants.  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  Admissions: [facility] from 06/15/2015 - 06/22/2015.  She underwent exploratory laparotomy, lysis of adhesions, total abdominal hysterectomy with bilateral salpingo-oophorectomy, infracolic omentectomy, optimal tumor debulking(< 1 cm), recto-sigmoid resection with creation of end colostomy, cholecystectomy, mobilization of splenic flexure and liver with diaphragmatic stripping on 06/15/2015. The right diaphragm was cleared of tumor. During dissection, the diaphragm was entered and closed with sutures.  [facility] from 07/01/2015 - 07/03/2015 with pleural effusion. [facility] from 07/07/2015 - 07/09/2015 with recurrent pleural effusion. [facility] from 07/29/2015 - 08/10/2015 with a right-sided empyema and liver abscess. She underwent CT-guided placement of a liver abscess drain on 07/30/2015.  Liver abscess culture grew out group B strep and Enterobacter which was sensitive to Zosyn. She was transitioned to ertapenem Colbert Ewing) prior to discharge.  She was readmitted to [facility] on 09/17/2015. [facility] from 05/26/2016 - 05/28/2016 with altered mental status and an acute embolic CVA.  Head MRI on 05/26/2016 revealed multiple small areas of acute infarct involving the occipital parietal lobe bilaterally and the left lateral cerebellum,  consistent with posterior circulation emboli.  Work-up included a negative carotid ultrasound and echocardiogram. [facility] from 08/10/2016 - 08/14/2016 with a small bowel obstruction.  She was managed conservatively. [facility] from 09/27/2016 - 09/28/2016 with symptomatic anemia and diarrhea.   Infusion on 10/23/2017  Component Date Value Ref Range Status  . Magnesium 10/23/2017 1.7  1.7 - 2.4 mg/dL Final    Performed at [facility], [address] [place], [place] [address]  . Sodium 10/23/2017 134* 135 - 145 mmol/L Final  . Potassium 10/23/2017 4.0  3.5 - 5.1 mmol/L Final  . Chloride 10/23/2017 101  101 - 111 mmol/L Final  . CO2 10/23/2017 25  22 - 32 mmol/L Final  . Glucose, Bld 10/23/2017 237* 65 - 99 mg/dL Final  .  BUN 10/23/2017 19  6 - 20 mg/dL Final  . Creatinine, Ser 10/23/2017 0.75  0.44 - 1.00 mg/dL Final  . Calcium 10/23/2017 8.7* 8.9 - 10.3 mg/dL Final  . Total Protein 10/23/2017 6.7  6.5 - 8.1 g/dL Final  . Albumin 10/23/2017 2.8* 3.5 - 5.0 g/dL Final  . AST 10/23/2017 36  15 - 41 U/L Final  . ALT 10/23/2017 175* 14 - 54 U/L Final  . Alkaline Phosphatase 10/23/2017 328* 38 - 126 U/L Final  . Total Bilirubin 10/23/2017 0.7  0.3 - 1.2 mg/dL Final  . GFR calc non Af Amer 10/23/2017 >60  >60 mL/min Final  . GFR calc Af Amer 10/23/2017 >60  >60 mL/min Final   Comment: (NOTE) The eGFR has been calculated using the CKD EPI equation. This calculation has not been validated in all clinical situations. eGFR's persistently <60 mL/min signify possible Chronic Kidney Disease.   Georgiann Hahn gap 10/23/2017 8  5 - 15 Final   Performed at [facility], [address] [place], [place] [address]  . WBC 10/23/2017 3.5* 3.6 - 11.0 K/uL Final  . RBC 10/23/2017 3.43* 3.80 - 5.20 MIL/uL Final  . Hemoglobin 10/23/2017 8.8* 12.0 - 16.0 g/dL Final  . HCT 10/23/2017 26.8* 35.0 - 47.0 % Final  . MCV 10/23/2017 78.3* 80.0 - 100.0 fL Final  . MCH 10/23/2017 25.8* 26.0 - 34.0 pg Final  . MCHC 10/23/2017 32.9  32.0 - 36.0 g/dL Final  . RDW 10/23/2017 16.7* 11.5 - 14.5 % Final  . Platelets 10/23/2017 216  150 - 440 K/uL Final  . Neutrophils Relative % 10/23/2017 70  % Final  . Neutro Abs 10/23/2017 2.5  1.4 - 6.5 K/uL Final  . Lymphocytes Relative 10/23/2017 14  % Final  . Lymphs Abs 10/23/2017 0.5* 1.0 - 3.6 K/uL Final  . Monocytes Relative 10/23/2017 14  % Final  . Monocytes Absolute  10/23/2017 0.5  0.2 - 0.9 K/uL Final  . Eosinophils Relative 10/23/2017 1  % Final  . Eosinophils Absolute 10/23/2017 0.0  0 - 0.7 K/uL Final  . Basophils Relative 10/23/2017 1  % Final  . Basophils Absolute 10/23/2017 0.0  0 - 0.1 K/uL Final   Performed at [facility], [address], [place], [place] [address]    Assessment:  [name] is a 73 y.o. female with progressive stage IV ovarian cancer.  She presented with abdominal discomfort and bloating.  Omental biopsy on 02/23/2015 revealed metastatic high grade serous carcinoma, consistent with gynecologic origin.   She was initially diagnosed with clinical stage IIIC ([id number]).  CA125 was 707 on 02/17/2015.  She received 4 cycles of neoadjuvant carboplatin and Taxol (03/05/2015 - 05/22/2015).  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  She had loose stools on oral magnesium.  She was initially on Neurontin then switched Lyrica with cycle #3.  Cycle #4 was notable for neutropenia (ANC 300) requiring GCSF x 3 days.    She underwent exploratory laparotomy, lysis of adhesions, total abdominal hysterectomy with bilateral salpingo-oophorectomy, infracolic omentectomy, optimal tumor debulking(< 1 cm), recto-sigmoid resection with creation of end colostomy, cholecystectomy, mobilization of splenic flexure and liver with diaphragmatic stripping on 06/15/2015. The right diaphragm was cleared of tumor. During dissection, the diaphragm was entered and closed with sutures.  She received tamoxifen from 02/25/2016 - 03/14/2016.  She received 4 cycles of carboplatin and gemcitabine (03/21/2016 - 05/24/2016) with GCSF/Neulasta support.  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  Cymbalta began on 02/24/2017  PDL-1 testing revealed a combined score was 5 (>=1 positive).     [facility] genetic testing negative for APC, ATM, BMPR1A, BRCA1, BRCA2, BRIP1, CDH1, CDK4, CDKN2A, CHEK2, EPCAM, MLH1, MSH2, MSH6, MUTYH, NBN, PALB2, PMS2,  PTEN, RAD51C, RAD51D, SMAD4, STK11, TP53. Sequencing for select regions of POLE and POLD1, and large rearrangement analysis for select regions of GREM1 were negative.  She received 2 cycles of Doxil (06/21/2016 - 07/19/2016) with Neulasta support.  She received 4 cycles of topotecan with Neulasta support (08/22/2016 - 10/10/2016).    She received 3 cycles of pembrolizumab (Keytruda) from 12/06/2016 -  01/09/2017.  Cycle #1 was complicated by neutropenia (ANC 400) requiring GCSF/Granix x 3 days.  She received GCSF x 5 days with cycle #2.  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 Right lower chest pleural metastasis and several of the peritoneal tumor implants had increased.  There was new right inguinal lymphadenopathy.  Some of the peritoneal tumor implants had decreased.  The small dependent left pleural effusion was slightly increased .  The mild left hydroureteronephrosis was improved.  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   The chronic biliary ductal dilatation was stable. There was no evidence of bowel obstruction.  CA125 was 802.9 on 03/30/2015, 567.9 on 04/13/2015, 168.8 on 05/15/2015, 85.2 on 07/17/2015, 68.6 on 07/28/2015, 34.5 on 09/17/2015, 20.7 on 11/06/2015, 49 on 01/22/2016, 106.1 on 02/22/2016, 138.8 on 03/07/2016, 220.8 on 03/21/2016, 152.8 on 04/11/2016, 125.6 on 04/18/2016, 76.8 on 05/03/2016, 60.2 on 05/24/2016, 44 on 07/19/2016, 65.8 on 08/17/2016, 53.4 on 09/12/2016, 47.3 on 10/10/2016, 91 on 12/20/2016, 141.1 on 01/09/2017, 463.7 on 02/27/2017, 907.1 on 03/27/2017, 192.1 on 05/22/2017, 189.1 on 05/24/2017, and 144.3 on 06/19/2017.  She has a history of recurrent right sided pleural effusion.  She underwent thoracentesis of 650 cc on post-operative day 3.  She was admitted to [facility] on 07/01/2015 and 07/07/2015 for recurrent  shortness of breath.  She underwent 2 additional thoracenteses (1.1 L on 07/02/2015 and 850 cc on 07/08/2015).  Cytology was negative x 2.  Bilateral lower extremity duplex on 07/03/2015 was negative.  Echo revealed an EF of 55-60% on 07/08/2015 and 60-65% on 05/27/2016.    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 She was on Lovenox 60 mg twice a day.  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  She has severe rheumatoid arthritis.  Methotrexate and Enbrel were initially on hold.  She has a normocytic anemia.  Work-up on 02/17/2015 and 05/24/2016 revealed a normal ferritin, B12, folate, TSH.  She denies any melena or hematochezia.    She has anemia due to chronic disease. She received 1 unit PRBCs during her admission at [facility]. She denies any melena or hematochezia. She has diabetes and is on an insulin pump.  She has had persistent neutropenia felt secondary to her rheumatoid arthritis.  Folate and MMA were normal.  TSH was 6.13 (high) with a free T4 of 1.17 (0.61-1.12).  She began methotrexate (10 mg a week) and prednisone (5 mg a day) for severe rheumatoid arthritis on 12/17/2015.  She remains on prednisone alone (5 mg a day).  Bone marrow aspirate and biopsy on 06/09/2010 revealed a  hypercellular marrow (70%) with no evidence of dysplasia or malignancy.  Flow cytometry was negative.  Cytogenetics were normal (46,XX).  FISH studies were negative for MDS.  Bone marrow aspirate and biopsy on 12/03/2015 revealed a normocellular to mildy hypercellular marrow for age (40%) with left shifted myelopoiesis, non specific dyserythropoiesis and mild megakaryocytic atypia with no increase in blasts.  There were multiple small nonspecific lymphoid aggregates (favor reactive). There was no increase in reticulin.  There was  decreased myeloid cells (37%) with left shifted maturation and 1% atypical myelod blasts.  There was relatively increased monocytic cells (11%), relatively increased lymphoid cells (36%), and relatively increased eosinophils (6%).  Cytogenetics were normal (26, XX).  SNP microarray was normal.  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  She receives IV magnesium twice weekly.    She has chemotherapy induced anemia.  She has received Procrit (last 06/26/2017).  She received 1 unit of PRBCs on 06/05/2017.  Labs on 08/22/2016 revealed a normal B12 and folate.  Ferritin was 201, iron saturation 21% and TIBC 307 on 06/09/2017.  B12 and folate were normal.  Code status is DNR/DNI.  She developed acute LFT elevation on 11/28/2016.  Event was preceded by upper abdominal discomfort.  Hepatitis B core antibody total and hepatitis C antibody were negative.  She received 5 cycles of Abraxane (03/13/2017 - 06/26/2017).  Cycle #1 was truncated after day 1 secondary to side effects and Thanksgiving.  Chest, abdomen and pelvic CT on 07/10/2017 revealed progression of disease in the abdomen pelvis.  There was interval increase in size of hepatic lesions most noticeably in the LEFT hepatic lobe.  There was interval increase in bilateral hydronephrosis and hydroureter.  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  There was interval increase in size of large peritoneal and pelvic peritoneal implants.  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  She underwent bilateral ureteral stent placement on 07/13/2017.  She was [facility] on 07/[facility]/2017.  Symptomatically, patient is feeling better today. She notes nausea, vomiting, diarrhea, and fever (tmax 100.1) over the weekend. Patient eating well. Her weight has increased 2 pounds. Exam reveals crackles in the RLL and increasing peritoneal nodularity.    Plan: 1. Labs today: CBC with diff, CMP, Mg, ferritin, iron studies, ESR, UA with C&S, ALP isoenzymes 2. Discuss chronic HYPOmagnesemia. Magnesium refractory to frequent oral and parenteral replacement. Magnesium glycinate has been tried at home, however it did cause the patient to have loose stools. While on oral supplementation, her parenteral replacement needs have not decreased. Magnesium level 1.7 today. No IV magnesium needed today. Patient to restart oral Mg 200 mg BID at home.  3. Discuss anemia.  Hemoglobin stable at 8.8.  No transfusion needed at this time.  Anemia work-up.  Will continue to monitor CBC monthly. 4. Patient encouraged to take oral iron supplement with a source of vitamin C daily. If patient unable to tolerate oral iron, or ferritin does not improve, discussed the need for intravenous iron replacement.  5. Discuss elevated LFTs. AST 36, ALT 175, and ALP 328. This is a dramatic increase. Will fractionate ALP to determine source (liver vs. bone). Discuss need for liver imaging to assess for obstruction.    6. Discuss symptom management.  Patient has antiemetics and pain medications at home to use on a PRN basis. Patient rarely has to use prescribed interventions. Continue all medications as previously prescribed. Will continue to coordinate care  with hospice team. 7. Discuss nutrition. Weight up 2 pounds. Will encourage her to increase her intake of calorie and protein dense food choices. Additionally, she was encouraged to utilize nutritional supplement shakes at least 2-3 times a day.  8. CXR (PA and lateral)- assess RLL. 9. Abdominal ultrasound- r/o biliary obstruction. 10. RTC on Wednesday this week for labs (Mg) and +/- IV Mg replacement.  11. RTC on Mondays and Fridays x 3 weeks for labs (Mg) and +/- intravenous Mg replacement.   12. RTC in 1 month for MD assessment and labs (CBC with diff, CMP, Mg), and +/- intravenous Mg replacement.    Honor Loh, NP 10/23/17, 9:30  AM   I saw and evaluated the patient, participating in the key portions of the service and reviewing pertinent diagnostic studies and records.  I reviewed the nurse practitioner's note and agree with the findings and the plan.  The assessment and plan were discussed with the patient.  Multiple questions were asked by the patient and her husband and answered.   Nolon Stalls, MD 10/23/17, 9:30 AM

## 2017-10-22 NOTE — Telephone Encounter (Signed)
Acceptable risk for surgery We do not manage her lovenox,  But presumable this would be ok to hold morning of the procedure

## 2017-10-23 ENCOUNTER — Other Ambulatory Visit: Payer: Self-pay

## 2017-10-23 ENCOUNTER — Inpatient Hospital Stay

## 2017-10-23 ENCOUNTER — Ambulatory Visit
Admission: RE | Admit: 2017-10-23 | Discharge: 2017-10-23 | Disposition: A | Discharge status: Home / Self Care | Source: Ambulatory Visit | Attending: Urgent Care | Admitting: Urgent Care

## 2017-10-23 ENCOUNTER — Inpatient Hospital Stay (HOSPITAL_BASED_OUTPATIENT_CLINIC_OR_DEPARTMENT_OTHER): Admitting: Hematology and Oncology

## 2017-10-23 ENCOUNTER — Encounter: Payer: Self-pay | Admitting: Hematology and Oncology

## 2017-10-23 ENCOUNTER — Ambulatory Visit
Admission: RE | Admit: 2017-10-23 | Discharge: 2017-10-23 | Disposition: A | Discharge status: Home / Self Care | Source: Ambulatory Visit | Attending: Hematology and Oncology | Admitting: Hematology and Oncology

## 2017-10-23 VITALS — BP 120/66 | HR 96 | Temp 97.8°F | Resp 18 | Wt 121.8 lb

## 2017-10-23 DIAGNOSIS — D649 Anemia, unspecified: Secondary | ICD-10-CM | POA: Insufficient documentation

## 2017-10-23 DIAGNOSIS — C786 Secondary malignant neoplasm of retroperitoneum and peritoneum: Secondary | ICD-10-CM | POA: Diagnosis not present

## 2017-10-23 DIAGNOSIS — R7989 Other specified abnormal findings of blood chemistry: Secondary | ICD-10-CM

## 2017-10-23 DIAGNOSIS — R945 Abnormal results of liver function studies: Secondary | ICD-10-CM

## 2017-10-23 DIAGNOSIS — E876 Hypokalemia: Secondary | ICD-10-CM

## 2017-10-23 DIAGNOSIS — Z66 Do not resuscitate: Secondary | ICD-10-CM

## 2017-10-23 DIAGNOSIS — J90 Pleural effusion, not elsewhere classified: Secondary | ICD-10-CM | POA: Diagnosis not present

## 2017-10-23 DIAGNOSIS — I509 Heart failure, unspecified: Secondary | ICD-10-CM

## 2017-10-23 DIAGNOSIS — C561 Malignant neoplasm of right ovary: Secondary | ICD-10-CM

## 2017-10-23 DIAGNOSIS — D6481 Anemia due to antineoplastic chemotherapy: Secondary | ICD-10-CM | POA: Diagnosis not present

## 2017-10-23 DIAGNOSIS — R0689 Other abnormalities of breathing: Secondary | ICD-10-CM | POA: Diagnosis not present

## 2017-10-23 DIAGNOSIS — Z8673 Personal history of transient ischemic attack (TIA), and cerebral infarction without residual deficits: Secondary | ICD-10-CM

## 2017-10-23 DIAGNOSIS — Z87891 Personal history of nicotine dependence: Secondary | ICD-10-CM

## 2017-10-23 DIAGNOSIS — Z9221 Personal history of antineoplastic chemotherapy: Secondary | ICD-10-CM

## 2017-10-23 DIAGNOSIS — C782 Secondary malignant neoplasm of pleura: Secondary | ICD-10-CM | POA: Diagnosis not present

## 2017-10-23 DIAGNOSIS — Z79899 Other long term (current) drug therapy: Secondary | ICD-10-CM

## 2017-10-23 DIAGNOSIS — E785 Hyperlipidemia, unspecified: Secondary | ICD-10-CM

## 2017-10-23 DIAGNOSIS — T451X5S Adverse effect of antineoplastic and immunosuppressive drugs, sequela: Secondary | ICD-10-CM

## 2017-10-23 DIAGNOSIS — C800 Disseminated malignant neoplasm, unspecified: Secondary | ICD-10-CM | POA: Diagnosis present

## 2017-10-23 DIAGNOSIS — I2510 Atherosclerotic heart disease of native coronary artery without angina pectoris: Secondary | ICD-10-CM

## 2017-10-23 DIAGNOSIS — Z7189 Other specified counseling: Secondary | ICD-10-CM

## 2017-10-23 DIAGNOSIS — C801 Malignant (primary) neoplasm, unspecified: Principal | ICD-10-CM

## 2017-10-23 DIAGNOSIS — N1330 Unspecified hydronephrosis: Secondary | ICD-10-CM

## 2017-10-23 DIAGNOSIS — E1140 Type 2 diabetes mellitus with diabetic neuropathy, unspecified: Secondary | ICD-10-CM

## 2017-10-23 DIAGNOSIS — E109 Type 1 diabetes mellitus without complications: Secondary | ICD-10-CM

## 2017-10-23 DIAGNOSIS — Z90722 Acquired absence of ovaries, bilateral: Secondary | ICD-10-CM

## 2017-10-23 DIAGNOSIS — I252 Old myocardial infarction: Secondary | ICD-10-CM

## 2017-10-23 DIAGNOSIS — F329 Major depressive disorder, single episode, unspecified: Secondary | ICD-10-CM

## 2017-10-23 DIAGNOSIS — Z801 Family history of malignant neoplasm of trachea, bronchus and lung: Secondary | ICD-10-CM

## 2017-10-23 DIAGNOSIS — I82C11 Acute embolism and thrombosis of right internal jugular vein: Secondary | ICD-10-CM

## 2017-10-23 DIAGNOSIS — M069 Rheumatoid arthritis, unspecified: Secondary | ICD-10-CM

## 2017-10-23 DIAGNOSIS — Z933 Colostomy status: Secondary | ICD-10-CM

## 2017-10-23 DIAGNOSIS — Z90710 Acquired absence of both cervix and uterus: Secondary | ICD-10-CM

## 2017-10-23 DIAGNOSIS — I10 Essential (primary) hypertension: Secondary | ICD-10-CM

## 2017-10-23 DIAGNOSIS — D72819 Decreased white blood cell count, unspecified: Secondary | ICD-10-CM

## 2017-10-23 LAB — COMPREHENSIVE METABOLIC PANEL
ALT: 175 U/L — ABNORMAL HIGH (ref 14–54)
AST: 36 U/L (ref 15–41)
Albumin: 2.8 g/dL — ABNORMAL LOW (ref 3.5–5.0)
Alkaline Phosphatase: 328 U/L — ABNORMAL HIGH (ref 38–126)
Anion gap: 8 (ref 5–15)
BUN: 19 mg/dL (ref 6–20)
CO2: 25 mmol/L (ref 22–32)
Calcium: 8.7 mg/dL — ABNORMAL LOW (ref 8.9–10.3)
Chloride: 101 mmol/L (ref 101–111)
Creatinine, Ser: 0.75 mg/dL (ref 0.44–1.00)
GFR calc Af Amer: >60 mL/min (ref >60)
GFR calc non Af Amer: >60 mL/min (ref >60)
Glucose, Bld: 237 mg/dL — ABNORMAL HIGH (ref 65–99)
Potassium: 4 mmol/L (ref 3.5–5.1)
Sodium: 134 mmol/L — ABNORMAL LOW (ref 135–145)
Total Bilirubin: 0.7 mg/dL (ref 0.3–1.2)
Total Protein: 6.7 g/dL (ref 6.5–8.1)

## 2017-10-23 LAB — CBC WITH DIFFERENTIAL/PLATELET
Basophils Absolute: 0 10*3/uL (ref 0–0.1)
Basophils Relative: 1 %
Eosinophils Absolute: 0 10*3/uL (ref 0–0.7)
Eosinophils Relative: 1 %
HCT: 26.8 % — ABNORMAL LOW (ref 35.0–47.0)
Hemoglobin: 8.8 g/dL — ABNORMAL LOW (ref 12.0–16.0)
Lymphocytes Relative: 14 %
Lymphs Abs: 0.5 10*3/uL — ABNORMAL LOW (ref 1.0–3.6)
MCH: 25.8 pg — ABNORMAL LOW (ref 26.0–34.0)
MCHC: 32.9 g/dL (ref 32.0–36.0)
MCV: 78.3 fL — ABNORMAL LOW (ref 80.0–100.0)
Monocytes Absolute: 0.5 10*3/uL (ref 0.2–0.9)
Monocytes Relative: 14 %
Neutro Abs: 2.5 10*3/uL (ref 1.4–6.5)
Neutrophils Relative %: 70 %
Platelets: 216 10*3/uL (ref 150–440)
RBC: 3.43 MIL/uL — ABNORMAL LOW (ref 3.80–5.20)
RDW: 16.7 % — ABNORMAL HIGH (ref 11.5–14.5)
WBC: 3.5 10*3/uL — ABNORMAL LOW (ref 3.6–11.0)

## 2017-10-23 LAB — MAGNESIUM: Magnesium: 1.7 mg/dL (ref 1.7–2.4)

## 2017-10-23 MED ORDER — HEPARIN SOD (PORK) LOCK FLUSH 100 UNIT/ML IV SOLN: 500.0000 [IU] | Freq: Once | INTRAVENOUS | Status: AC

## 2017-10-23 MED ORDER — SODIUM CHLORIDE 0.9% FLUSH
10.0000 mL | INTRAVENOUS | Status: AC | PRN
  Administered 2017-10-23: 10 mL via INTRAVENOUS
  Filled 2017-10-23: qty 10

## 2017-10-23 NOTE — Telephone Encounter (Signed)
Routed to number provided via EPIC fax.  

## 2017-10-23 NOTE — Progress Notes (Signed)
Patient here for follow up. No concerns voiced.  °

## 2017-10-24 ENCOUNTER — Ambulatory Visit
Admission: RE | Admit: 2017-10-24 | Discharge: 2017-10-24 | Disposition: A | Discharge status: Home / Self Care | Source: Ambulatory Visit | Attending: Urgent Care | Admitting: Urgent Care

## 2017-10-24 ENCOUNTER — Ambulatory Visit: Payer: Medicare Other | Admitting: Hematology and Oncology

## 2017-10-24 ENCOUNTER — Ambulatory Visit: Payer: Medicare Other

## 2017-10-24 ENCOUNTER — Other Ambulatory Visit: Payer: Medicare Other

## 2017-10-24 DIAGNOSIS — C787 Secondary malignant neoplasm of liver and intrahepatic bile duct: Secondary | ICD-10-CM | POA: Diagnosis not present

## 2017-10-24 DIAGNOSIS — C561 Malignant neoplasm of right ovary: Secondary | ICD-10-CM | POA: Diagnosis not present

## 2017-10-24 DIAGNOSIS — C801 Malignant (primary) neoplasm, unspecified: Secondary | ICD-10-CM

## 2017-10-24 DIAGNOSIS — J90 Pleural effusion, not elsewhere classified: Secondary | ICD-10-CM | POA: Diagnosis not present

## 2017-10-24 DIAGNOSIS — C786 Secondary malignant neoplasm of retroperitoneum and peritoneum: Secondary | ICD-10-CM | POA: Diagnosis present

## 2017-10-24 DIAGNOSIS — R945 Abnormal results of liver function studies: Secondary | ICD-10-CM | POA: Diagnosis not present

## 2017-10-24 DIAGNOSIS — C800 Disseminated malignant neoplasm, unspecified: Secondary | ICD-10-CM

## 2017-10-24 DIAGNOSIS — R7989 Other specified abnormal findings of blood chemistry: Secondary | ICD-10-CM

## 2017-10-25 ENCOUNTER — Other Ambulatory Visit: Payer: Self-pay | Admitting: Urgent Care

## 2017-10-25 ENCOUNTER — Inpatient Hospital Stay

## 2017-10-25 DIAGNOSIS — D649 Anemia, unspecified: Secondary | ICD-10-CM

## 2017-10-25 DIAGNOSIS — C800 Disseminated malignant neoplasm, unspecified: Principal | ICD-10-CM

## 2017-10-25 DIAGNOSIS — R300 Dysuria: Secondary | ICD-10-CM

## 2017-10-25 DIAGNOSIS — C801 Malignant (primary) neoplasm, unspecified: Secondary | ICD-10-CM

## 2017-10-25 DIAGNOSIS — C786 Secondary malignant neoplasm of retroperitoneum and peritoneum: Secondary | ICD-10-CM

## 2017-10-25 DIAGNOSIS — C561 Malignant neoplasm of right ovary: Secondary | ICD-10-CM

## 2017-10-25 LAB — IRON AND TIBC
IRON: 27 ug/dL — AB (ref 28–170)
SATURATION RATIOS: 10 % — AB (ref 10.4–31.8)
TIBC: 277 ug/dL (ref 250–450)
UIBC: 250 ug/dL

## 2017-10-25 LAB — SEDIMENTATION RATE: Sed Rate: 106 mm/hr — ABNORMAL HIGH (ref 0–30)

## 2017-10-25 LAB — MAGNESIUM: Magnesium: 1.4 mg/dL — ABNORMAL LOW (ref 1.7–2.4)

## 2017-10-25 LAB — FERRITIN: FERRITIN: 135 ng/mL (ref 11–307)

## 2017-10-25 MED ORDER — SODIUM CHLORIDE 0.9% FLUSH
10.0000 mL | INTRAVENOUS | Status: DC | PRN
  Administered 2017-10-25: 10 mL via INTRAVENOUS
  Filled 2017-10-25: qty 10

## 2017-10-25 MED ORDER — HEPARIN SOD (PORK) LOCK FLUSH 100 UNIT/ML IV SOLN: 500.0000 [IU] | Freq: Once | INTRAVENOUS | Status: DC

## 2017-10-25 MED ORDER — SODIUM CHLORIDE 0.9 % IV SOLN
INTRAVENOUS | Status: DC
  Filled 2017-10-25: qty 1000

## 2017-10-25 MED ORDER — SODIUM CHLORIDE 0.9 % IV SOLN
Freq: Once | INTRAVENOUS | Status: DC
  Filled 2017-10-25: qty 500

## 2017-10-25 MED ORDER — SODIUM CHLORIDE 0.9 % IV SOLN: 6.0000 g | Freq: Once | INTRAVENOUS | Status: DC

## 2017-10-25 NOTE — Progress Notes (Signed)
Per pt and spouse, they would like to hold off on Mg infusion today and return Friday to check Mg levels. Per pt, she stopped taking Mg PO supplements on Sunday and was wanting to see what her Mg levels would be with taking them and Monday they were WNL. She wanted to assess today whether her Mg levels had remained WNL without the PO supp and they were 1.4, pt and spouse informed of this and she would like to begin taking her PO supp again today and determine if they are helping on Friday when she returns. Pt also unable to urinate for speciman today, given swabs and cup to return on Friday. MD notified of pts wishes.

## 2017-10-27 ENCOUNTER — Inpatient Hospital Stay

## 2017-10-27 ENCOUNTER — Inpatient Hospital Stay (HOSPITAL_BASED_OUTPATIENT_CLINIC_OR_DEPARTMENT_OTHER): Admitting: Hematology and Oncology

## 2017-10-27 VITALS — BP 108/60 | HR 84 | Temp 98.0°F | Resp 16

## 2017-10-27 VITALS — BP 172/80 | HR 80 | Temp 96.6°F | Resp 18 | Wt 122.1 lb

## 2017-10-27 DIAGNOSIS — D72819 Decreased white blood cell count, unspecified: Secondary | ICD-10-CM

## 2017-10-27 DIAGNOSIS — F329 Major depressive disorder, single episode, unspecified: Secondary | ICD-10-CM

## 2017-10-27 DIAGNOSIS — C782 Secondary malignant neoplasm of pleura: Secondary | ICD-10-CM

## 2017-10-27 DIAGNOSIS — I252 Old myocardial infarction: Secondary | ICD-10-CM

## 2017-10-27 DIAGNOSIS — I2510 Atherosclerotic heart disease of native coronary artery without angina pectoris: Secondary | ICD-10-CM

## 2017-10-27 DIAGNOSIS — E1140 Type 2 diabetes mellitus with diabetic neuropathy, unspecified: Secondary | ICD-10-CM

## 2017-10-27 DIAGNOSIS — Z79899 Other long term (current) drug therapy: Secondary | ICD-10-CM

## 2017-10-27 DIAGNOSIS — Z933 Colostomy status: Secondary | ICD-10-CM

## 2017-10-27 DIAGNOSIS — N1330 Unspecified hydronephrosis: Secondary | ICD-10-CM

## 2017-10-27 DIAGNOSIS — D6481 Anemia due to antineoplastic chemotherapy: Secondary | ICD-10-CM

## 2017-10-27 DIAGNOSIS — C801 Malignant (primary) neoplasm, unspecified: Secondary | ICD-10-CM

## 2017-10-27 DIAGNOSIS — I509 Heart failure, unspecified: Secondary | ICD-10-CM

## 2017-10-27 DIAGNOSIS — E109 Type 1 diabetes mellitus without complications: Secondary | ICD-10-CM

## 2017-10-27 DIAGNOSIS — Z8673 Personal history of transient ischemic attack (TIA), and cerebral infarction without residual deficits: Secondary | ICD-10-CM

## 2017-10-27 DIAGNOSIS — R300 Dysuria: Secondary | ICD-10-CM

## 2017-10-27 DIAGNOSIS — Z90710 Acquired absence of both cervix and uterus: Secondary | ICD-10-CM

## 2017-10-27 DIAGNOSIS — I10 Essential (primary) hypertension: Secondary | ICD-10-CM

## 2017-10-27 DIAGNOSIS — Z66 Do not resuscitate: Secondary | ICD-10-CM

## 2017-10-27 DIAGNOSIS — C786 Secondary malignant neoplasm of retroperitoneum and peritoneum: Secondary | ICD-10-CM

## 2017-10-27 DIAGNOSIS — R7989 Other specified abnormal findings of blood chemistry: Secondary | ICD-10-CM

## 2017-10-27 DIAGNOSIS — J90 Pleural effusion, not elsewhere classified: Secondary | ICD-10-CM

## 2017-10-27 DIAGNOSIS — D500 Iron deficiency anemia secondary to blood loss (chronic): Secondary | ICD-10-CM

## 2017-10-27 DIAGNOSIS — C561 Malignant neoplasm of right ovary: Secondary | ICD-10-CM

## 2017-10-27 DIAGNOSIS — C787 Secondary malignant neoplasm of liver and intrahepatic bile duct: Secondary | ICD-10-CM | POA: Diagnosis not present

## 2017-10-27 DIAGNOSIS — T451X5S Adverse effect of antineoplastic and immunosuppressive drugs, sequela: Secondary | ICD-10-CM

## 2017-10-27 DIAGNOSIS — C800 Disseminated malignant neoplasm, unspecified: Principal | ICD-10-CM

## 2017-10-27 DIAGNOSIS — Z90722 Acquired absence of ovaries, bilateral: Secondary | ICD-10-CM

## 2017-10-27 DIAGNOSIS — M069 Rheumatoid arthritis, unspecified: Secondary | ICD-10-CM

## 2017-10-27 DIAGNOSIS — E785 Hyperlipidemia, unspecified: Secondary | ICD-10-CM

## 2017-10-27 DIAGNOSIS — R945 Abnormal results of liver function studies: Secondary | ICD-10-CM

## 2017-10-27 DIAGNOSIS — Z9221 Personal history of antineoplastic chemotherapy: Secondary | ICD-10-CM

## 2017-10-27 DIAGNOSIS — Z87891 Personal history of nicotine dependence: Secondary | ICD-10-CM

## 2017-10-27 DIAGNOSIS — Z801 Family history of malignant neoplasm of trachea, bronchus and lung: Secondary | ICD-10-CM

## 2017-10-27 DIAGNOSIS — C569 Malignant neoplasm of unspecified ovary: Secondary | ICD-10-CM

## 2017-10-27 DIAGNOSIS — E876 Hypokalemia: Secondary | ICD-10-CM

## 2017-10-27 LAB — URINALYSIS, COMPLETE (UACMP) WITH MICROSCOPIC
BACTERIA UA: NONE SEEN
Bilirubin Urine: NEGATIVE
Glucose, UA: NEGATIVE mg/dL
Ketones, ur: NEGATIVE mg/dL
Leukocytes, UA: NEGATIVE
Nitrite: NEGATIVE
PH: 6 (ref 5.0–8.0)
Protein, ur: NEGATIVE mg/dL
RBC / HPF: >50 RBC/hpf — ABNORMAL HIGH (ref 0–5)
SPECIFIC GRAVITY, URINE: 1.01 (ref 1.005–1.030)

## 2017-10-27 LAB — ALKALINE PHOSPHATASE, ISOENZYMES
ALK PHOS BONE FRACT: 31 % (ref 14–68)
ALK PHOS LIVER FRACT: 66 % (ref 18–85)
ALK PHOS: 269 IU/L — AB (ref 39–117)
Intestinal %: 3 % (ref 0–18)

## 2017-10-27 LAB — MAGNESIUM: Magnesium: 1.4 mg/dL — ABNORMAL LOW (ref 1.7–2.4)

## 2017-10-27 MED ORDER — SODIUM CHLORIDE 0.9 % IV SOLN
Freq: Once | INTRAVENOUS | Status: AC
  Administered 2017-10-27: 10:00:00 via INTRAVENOUS
  Filled 2017-10-27: qty 1000

## 2017-10-27 MED ORDER — HEPARIN SOD (PORK) LOCK FLUSH 100 UNIT/ML IV SOLN
500.0000 [IU] | Freq: Once | INTRAVENOUS | Status: AC
  Administered 2017-10-27: 500 [IU] via INTRAVENOUS
  Filled 2017-10-27: qty 5

## 2017-10-27 MED ORDER — SODIUM CHLORIDE 0.9% FLUSH
10.0000 mL | INTRAVENOUS | Status: DC | PRN
  Administered 2017-10-27: 10 mL via INTRAVENOUS
  Filled 2017-10-27: qty 10

## 2017-10-27 MED ORDER — MAGNESIUM SULFATE 50 % IJ SOLN
6.0000 g | Freq: Once | INTRAMUSCULAR | Status: AC
  Administered 2017-10-27: 6 g via INTRAVENOUS
  Filled 2017-10-27: qty 10

## 2017-10-27 NOTE — Progress Notes (Signed)
[name]  Chief Complaint: [name] is a 73 y.o. female with stage IV ovarian cancer who is seen for review of labs and imaging and discussion regarding direction of therapy.  HPI:  The patient was last seen in the medical oncology clinic on 10/23/2017.  At that time, she denied any new complaints.  She described some transient abdominal symptoms.  Liver function tests were markedly elevated.  We discussed RUQ ultrasound.  Magnesium was 1.7.  She did not received IV magnesium.  RUQ ultrasound on 10/24/2017 revealed multiple liver metastasis, the largest of 5.2 cm. There was no hydronephrosis.  There was a right pleural effusion.  During the interim, she has "felt about the same".  Stool is dark because of oral iron.  She has intermittent hematuria.  She has her stents replaced on 11/13/2017.  She has diarrhea after she takes the new oral magnesium pill.   Past Medical History:  Diagnosis Date  . Anemia   . Arthritis   . CAD (coronary artery disease)    a. 09/2012 Cath: LM nl, LAD 95p, 85m LCX 944mOM2 50, RCA 100.  . Marland KitchenHF (congestive heart failure) (HCCataw[place]  . Cholelithiasis   . Collagen vascular disease (HCC)    Rheumatoid Arthritis.  . Depression   . Environmental and seasonal allergies   . Esophageal stricture   . Exertional shortness of breath   . GERD (gastroesophageal reflux disease)   . H/O hiatal hernia   . Herniated disc   . History of pancytopenia   . Hyperlipidemia   . Hypertension   . Hypokalemia   . Leukopenia 2012   s/p bone marrow biopsy, Dr. PaMa Hillock. Neuropathy   . NSTEMI (non-ST elevated myocardial infarction) (HC[place]psonville5/2014   "mild" (10/18/2012)  . Ovarian cancer (HCDil[place]2016   chemo and hysterectomy  . Pneumonia 2013; 08/2012   "one lung; double" (10/18/2012)  . Rheumatoid arthritis(714.0)   . Stroke (HCReserve  . Type I diabetes mellitus (HCGlen White   "dx'd in 1957" (10/18/2012)  . Ureter  obstruction     Past Surgical History:  Procedure Laterality Date  . ABDOMINAL HYSTERECTOMY  06/15/2015   Dx L/S, EXLAP TAH BSO omentectomy RSRx colostomy diaphragm resection stripping  . CARDIAC CATHETERIZATION  10/18/2012   "first one was today" (10/18/2012)  . CATARACT EXTRACTION W/ INTRAOCULAR LENS IMPLANT Right 2010  . CHOLECYSTECTOMY  06/15/2015   combined case with ovarian cancer debulking  . COLON SURGERY    . CORONARY ARTERY BYPASS GRAFT N/A 10/19/2012   Procedure: CORONARY ARTERY BYPASS GRAFTING (CABG);  Surgeon: StMelrose NakayamaMD;  [facility] Service: Open Heart Surgery;  Laterality: N/A;  . CYSTOSCOPY W/ RETROGRADES Bilateral 07/13/2017   Procedure: CYSTOSCOPY WITH RETROGRADE PYELOGRAM;  Surgeon: BrHollice EspyMD;  Location: [facility] OR[facility];  Service: Urology;  Laterality: Bilateral;  . CYSTOSCOPY WITH STENT PLACEMENT Bilateral 07/13/2017   Procedure: CYSTOSCOPY WITH STENT PLACEMENT;  Surgeon: BrHollice EspyMD;  Location: [facility] ORS;  Service: Urology;  Laterality: Bilateral;  . ESOPHAGEAL DILATION     "3 or 4 times" (10/19/2011)  . ESOPHAGOGASTRODUODENOSCOPY  2012   Dr. IfMinna Merritts. OSTOMY    . OVARY SURGERY     removal  . PERIPHERAL VASCULAR CATHETERIZATION N/A 03/02/2015   Procedure: PoGlori Luisath Insertion;  Surgeon: JaAlgernon HuxleyMD;  [facility];  Service: Cardiovascular;  Laterality: N/A;  .  TUBAL LIGATION  1970    Family History  Problem Relation Age of Onset  . Diabetes Mother   . Arthritis Mother   . Diabetes Father   . Arthritis Father   . Aneurysm Sister        neck  . Cancer Sister        lung    Social History:  reports that she quit smoking about 23 years ago. Her smoking use included cigarettes. She has a 30.00 pack-year smoking history. She has never used smokeless tobacco. She reports that she does not drink alcohol or use drugs.  Patient is a retired Art therapist. Patient denies exposure to radiation and toxins. She spends  every Thursday afternoon with her sister.  Her son is [name] and daughter is [name].  She is accompanied by her husband, [name], today.  Allergies:  Allergies  Allergen Reactions  . Codeine Nausea And Vomiting  . Latex Rash    Current Medications: Current Outpatient Medications  Medication Sig Dispense Refill  . Calcium-Vitamin D 600-200 MG-UNIT tablet Take 1 tablet by mouth 2 (two) times daily.     . cholecalciferol (VITAMIN D) 1000 units tablet Take 1,000 Units by mouth daily.    . DULoxetine (CYMBALTA) 20 MG capsule Take 1 capsule (20 mg total) by mouth daily. 90 capsule 2  . enoxaparin (LOVENOX) 60 MG/0.6ML injection INJECT 0.6ML (60MG TOTAL) UNDER THE SKINEVERY 12 HOURS 36 mL 1  . folic acid (FOLVITE) 1 MG tablet TAKE 1 TABLET BY MOUTH ONCE DAILY 90 tablet 3  . insulin lispro (HUMALOG) 100 UNIT/ML injection Inject 0.06 mLs (6 Units total) into the skin daily. (Patient taking differently: Inject 0-85 Units into the skin daily. Via insulin pump) 30 mL 3  . lidocaine-prilocaine (EMLA) cream APPLY 1 APPLICATION TOPICALLY AS NEEDED 1 HOUR PRIOR TO TREATMENT. COVER WITH PRESS N SEAL UNTIL TREATMENT TIME AS DIRECTED 30 g 1  . loratadine (CLARITIN) 10 MG tablet Take 1 tablet by mouth daily as needed.    . magnesium oxide (MAG-OX) 400 (241.3 Mg) MG tablet TAKE 1 TABLET BY MOUTH ONCE DAILY 30 tablet 1  . metoprolol tartrate (LOPRESSOR) 25 MG tablet Take 0.5 tablets (12.5 mg total) by mouth daily after breakfast. 45 tablet 3  . ondansetron (ZOFRAN) 8 MG tablet TAKE 1 TABLET BY MOUTH EVERY 8 HOURS AS NEEDED FOR NAUSEA AND VOMITING 30 tablet 1  . oxybutynin (DITROPAN) 5 MG tablet Take 1 tablet (5 mg total) by mouth every 8 (eight) hours as needed for bladder spasms. 30 tablet 2  . pantoprazole (PROTONIX) 40 MG tablet TAKE 1 TABLET BY MOUTH TWICE (2) DAILY 180 tablet 1  . potassium chloride SA (K-DUR,KLOR-CON) 20 MEQ tablet TAKE 1 TABLET BY MOUTH ONCE A DAY 90 tablet 1  . predniSONE (DELTASONE) 5 MG  tablet Take 5 mg by mouth daily with breakfast.    . vitamin C (ASCORBIC ACID) 500 MG tablet Take 500 mg by mouth daily.    Marland Kitchen zinc gluconate 50 MG tablet Take 50 mg daily by mouth.     No current facility-administered medications for this visit.    Facility-Administered Medications Ordered in Other Visits  Medication Dose Route Frequency Provider Last Rate Last Dose  . 0.9 %  sodium chloride infusion   Intravenous Continuous Lequita Asal, MD   Stopped at 04/24/17 1140  . heparin lock flush 100 unit/mL  500 Units Intravenous Once Lequita Asal, MD      . heparin lock  flush 100 unit/mL  500 Units Intravenous Once Corcoran, Melissa C, MD      . magnesium sulfate 6 g in sodium chloride 0.9 % 500 mL  6 g Intravenous Once Karen Kitchens, NP 171 mL/hr at [name] 1008 6 g at [name] 1008  . magnesium sulfate IVPB 4 g 100 mL  4 g Intravenous Once Faythe Casa E, NP      . sodium chloride 0.9 % injection 10 mL  10 mL Intracatheter PRN Corcoran, Melissa C, MD      . sodium chloride 0.9 % injection 10 mL  10 mL Intravenous PRN Lequita Asal, MD   10 mL at 04/13/15 0852  . sodium chloride flush (NS) 0.9 % injection 10 mL  10 mL Intravenous PRN Corcoran, Melissa C, MD      . sodium chloride flush (NS) 0.9 % injection 10 mL  10 mL Intravenous PRN Nolon Stalls C, MD   10 mL at 10/23/17 0840  . sodium chloride flush (NS) 0.9 % injection 10 mL  10 mL Intravenous PRN Lequita Asal, MD   10 mL at [name] 0902    Review of Systems  Constitutional: Negative for diaphoresis, fever, malaise/fatigue and weight loss (up 1 pound).       Feels "about the same".  HENT: Negative.  Negative for nosebleeds, sinus pain and sore throat.   Eyes: Negative.  Negative for blurred vision, double vision, discharge and redness.  Respiratory: Negative.  Negative for cough, hemoptysis, sputum production and shortness of breath.   Cardiovascular: Negative.  Negative for chest pain, palpitations,  orthopnea and leg swelling.  Gastrointestinal: Negative for abdominal pain, blood in stool, constipation, diarrhea, melena, nausea and vomiting.       Stool black secondary to oral iron.  Genitourinary: Negative.  Negative for dysuria, frequency, hematuria and urgency.       Stents to be replaced on 11/13/2017.  Musculoskeletal: Positive for joint pain (Severe rheumatoid arthritis). Negative for back pain, falls, myalgias and neck pain.  Skin: Negative.  Negative for itching and rash.  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 Negative for dizziness, tremors, weakness and headaches.  Endo/Heme/Allergies: Does not bruise/bleed easily.       Diabetes; on insulin pump  Psychiatric/Behavioral: Negative for depression, memory loss and suicidal ideas. The patient is not nervous/anxious and does not have insomnia.   All other systems reviewed and are negative.  Performance status (ECOG): 2 - Symptomatic, <50% confined to bed   Physical Exam:  Blood pressure (!) 172/80, pulse 80, temperature (!) 96.6 F (35.9 C), temperature source Tympanic, resp. rate 18, weight 122 lb 2 oz (55.4 kg). GENERAL:  Well developed, well nourished, woman sitting comfortably in the exam room in no acute distress. MENTAL STATUS:  Alert and oriented to person, place and time. HEAD:  Short gray hair.  Normocephalic, atraumatic, face symmetric, no Cushingoid features. EYES:  Glasses.  Blue eyes.  No conjunctivitis or scleral icterus. NEUROLOGICAL: Unremarkable. PSYCH:  Appropriate.    Radiology studies: 02/13/2015:  Abdomen and pelvic CT revealed bilateral mass-like adnexal regions (right adnexal mass 5.6 x 5.0 cm and the left adnexa mass 10.3 x 6.0 x 9.9 cm).  There was a large amount of soft tissue throughout the peritoneal cavity involving the omentum and other peritoneal surfaces.  There was a small volume ascites. There was a peripheral 3.3 x 1.9 cm low-attenuation lesion  overlying the right lobe of the liver, likely representing  a serosal implant. There was 1.4 x 2.2 cm ill-defined peripheral lesion within the inferior aspect of segment 6 adjacent to the ampulla in the duodenum.  04/28/2015:  Abdominal and pelvic CT revealed decreasing bilateral ovarian masses.   The left adnexal mass measured 4.0 x 6.1 cm (previously 5.0 x 8.5 cm). The right ovary measured 3.8 x 4.9 cm (previously 4.7 x 5.6 cm).  There was improved peritoneal carcinomatosis.  There was a small amount of ascites.  The hepatic dome lesion was stable. The previously seen right hepatic lobe lesion was not well-visualized.  There was a small left pleural effusion and trace right pleural effusion.  The nodular lesion at the ampulla of Vater, extending into the duodenum was stable.  There was no biliary ductal dilatation. 08/01/2015:  Rght upper extremity ultrasound revealed a near occlusive thrombus within the central portion of the right internal jugular vein and central portion of the right subclavian vein. 09/01/2015:  Chest, abdomen, and pelvic CT revealed continued decrease in perihepatic fluid collection contiguous with the right pleural space with percutaneous drain.  09/11/2015:  Chest, abdomen, and pelvic CT revealed a residual versus recurrent fluid collection posterior to the right hepatic lobe (2.6 x 1.1 x 4.0 cm).   10/13/2015:  Chest, abdomen, and pelvic CT revealed resolution of the empyema. 02/15/2016:  Chest, abdomen, and pelvic CT revealed multiple soft tissue nodules throughout the pelvis, small bowel mesenteric and peritoneum and, concerning for peritoneal metastatic disease.  There was soft tissue irregularity within the left upper quadrant.  There was mild right hydronephrosis (etiology unclear).  There was interval resolution of previously described right pleural based fluid and gas collection. There was a pleural based nodule within the right lower hemi-thorax concerning for pleural based  metastasis.  There were small bilateral pleural effusions.  There was pulmonary nodularity, predominately within the left upper lobe (metastatic or infectious/inflammatory etiology).  There was slightly increased mediastinal adenopathy (infectious/inflammatory or metastatic).  There was a low attenuation lesion within the left hepatic lobe (complicated fluid within the fissure or metastatic disease). 03/08/2016:  Abdomen and pelvic CT revealed mild progression of peritoneal disease in the abdomen.  There was interval increase in loculated fluid around the lateral segment left liver, stomach, and spleen.  There was persistent soft tissue lesion at the level of the ampulla with mild intra and extrahepatic biliary duct dilatation.  There was persistent intrahepatic and capsular metastatic disease involving the liver. 05/26/2016:  Head MRI revealed multiple small areas of acute infarct involving the occipital parietal lobe bilaterally and the left lateral cerebellum,  consistent with posterior circulation emboli.  06/10/2016:  Adomen and pelvic CT revealed progression of peritoneal carcinomatosis predominantly in the perihepatic space, bilateral lower quadrants and pelvis.  There was worsening bilateral obstructive uropathy due to malignant involvement of the pelvic ureters.  There was stable small pleural/subpleural nodules at the right lung base.  There was stable small left pleural effusion.  There was decreased small volume perihepatic ascites.  08/09/2016:  Abdomen and pelvic CT revealed interval increase and loculated fluid collections within the peritoneal space consistent with progression of peritoneal ovarian carcinoma metastasis.  There was one large 7 cm collection in the RIGHT lower quadrant which had increased significantly in size and may represent of point of small bowel obstruction.  The proximal stomach and duodenum were decompressed. The mid small bowel was mildly dilated. There was concern for  obstruction given the large intraperitoneal fluid collection.  There was increase in subcapsular  hepatic metastatic fluid collections.  There was enlargement of rounded ampullary lesion in the second portion duodenum concerning for metastatic lesion.  There was mild biliary duct dilatation (stable).  There was nodular pleural metastasis in the RIGHT lower lobe pleural space. 11/03/2016:  Abdomen and pelvic CT revealed multifocal cystic metastases in the abdomen/pelvis, reflecting peritoneal disease.  The dominant lesion in the left anterior abdomen has progressed, while additional lesions were mixed.  Peritoneal disease along the liver and spleen progressed.  Pleural-based metastases along the posterior right hemithorax progressed. There were trace left pleural effusion.  There was a stable 1.7 cm duodenal lesion at the ampulla. 11/29/2016:  Abdomen and pelvic CT revealed mild interval enlargement of pleural metastasis the RIGHT lower lobe.  There was overall mild decrease in cystic peritoneal metastasis within the abdomen pelvis. The solid lesion at the terminal ileum was decreased in size. Subcapsular lesion in the liver was slightly decreased in size.  There was no evidence of disease progression the abdomen pelvis.  There was mild intrahepatic duct dilatation similar prior.  The ampullary lesion was slightly larger.  There was mild hydronephrosis and hydroureter on the LEFT likely related to cystic metastasis at the LEFT vesicoureteral junction 12/09/2016:  MRCP revealed the cystic lesion within the pancreatic head/uncinate process had decreased in size and demonstrated no suspicious characteristics. This can be presumed a pseudocyst or focus of side branch duct ectasia.  There was similar to slight progression of extensive peritoneal metastasis since 11/29/2016. Grossly similar abdominal nodal and right pleural metastasis.  There was chronic mild common duct dilatation with similar soft tissue fullness about  the ampulla compared to 11/29/2016.  There was improved to resolved left-sided hydronephrosis. 01/20/2017:  Abdomen and pelvic CT revealed complex mixed interval changes in widespread metastatic disease in the peritoneal cavity, lower chest and thoracic, abdominal and pelvic lymph nodes. Right lower chest pleural metastasis and several of the peritoneal tumor implants had increased.  There was new right inguinal lymphadenopathy.  Some of the peritoneal tumor implants had decreased.  The small dependent left pleural effusion was slightly increased .  The mild left hydroureteronephrosis was improved.  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   The chronic biliary ductal dilatation was stable .  There was no evidence of bowel obstruction. 05/22/2017:  Chest, abdomen, and pelvic CT revealed  significant enlargement of metastatic disease involving the right pleura, low paratracheal nodal chain, omentum, peritoneal surfaces, mesentery, and liver. Tumors extending through the ostomy site into the subcutaneous tissues. There were multiple new metastatic liver and right pleural lesions.  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  There was new sclerotic lesion in the T11 vertebral body, concerning for possible metastatic disease. There was healing anterior nondisplaced rib fractures of the right second through ninth ribs that appeared new. 07/10/2017:  Chest, abdomen and pelvic CT revealed RIGHT lower lobe pleural-based nodules, slightly smaller.  There was stable bilateral pleural effusions (left > right) and stable mediastinal adenopathy.  There was evidence of progression of disease in the abdomen pelvis.  There was interval increase in size of hepatic lesions most noticeably in the LEFT hepatic lobe.  There was interval increase in bilateral hydronephrosis and hydroureter.  SEVERE BILATERAL  HYDROURETER was secondary to large peritoneal masses in the pelvis obstructing distal ureters.  There was interval increase in size of large peritoneal and pelvic peritoneal implants.  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 10/24/2017:  RUQ ultrasound revealed multiple liver metastasis, the largest of 5.2 cm. There was no hydronephrosis.  There was a right pleural effusion.  Admissions: [place] from 06/15/2015 - 06/22/2015.  She underwent exploratory laparotomy, lysis of adhesions, total abdominal hysterectomy with bilateral salpingo-oophorectomy, infracolic omentectomy, optimal tumor debulking(< 1 cm), recto-sigmoid resection with creation of end colostomy, cholecystectomy, mobilization of splenic flexure and liver with diaphragmatic stripping on 06/15/2015. The right diaphragm was cleared of tumor. During dissection, the diaphragm was entered and closed with sutures.  [place] from 07/01/2015 - 07/03/2015 with pleural effusion. [place] from 07/07/2015 - 07/09/2015 with recurrent pleural effusion. [place] from 07/29/2015 - 08/10/2015 with a right-sided empyema and liver abscess. She underwent CT-guided placement of a liver abscess drain on 07/30/2015.  Liver abscess culture grew out group B strep and Enterobacter which was sensitive to Zosyn. She was transitioned to ertapenem Colbert Ewing) prior to discharge.  She was readmitted to [facility] on 09/17/2015. [place] from 05/26/2016 - 05/28/2016 with altered mental status and an acute embolic CVA.  Head MRI on 05/26/2016 revealed multiple small areas of acute infarct involving the occipital parietal lobe bilaterally and the left lateral cerebellum,  consistent with posterior circulation emboli.  Work-up included a negative carotid ultrasound and echocardiogram. [facility] from 08/10/2016 - 08/14/2016 with a small bowel obstruction.  She was managed conservatively. [place] from 09/27/2016 - 09/28/2016 with symptomatic anemia and diarrhea.   Infusion on  10/27/2017  Component Date Value Ref Range Status  . Magnesium 10/27/2017 1.4* 1.7 - 2.4 mg/dL Final   Performed at [facility], [address] [place], [place] [address]  . Color, Urine 10/27/2017 YELLOW* YELLOW Final  . APPearance 10/27/2017 HAZY* CLEAR Final  . Specific Gravity, Urine 10/27/2017 1.010  1.005 - 1.030 Final  . pH 10/27/2017 6.0  5.0 - 8.0 Final  . Glucose, UA 10/27/2017 NEGATIVE  NEGATIVE mg/dL Final  . Hgb urine dipstick 10/27/2017 LARGE* NEGATIVE Final  . Bilirubin Urine 10/27/2017 NEGATIVE  NEGATIVE Final  . Ketones, ur 10/27/2017 NEGATIVE  NEGATIVE mg/dL Final  . Protein, ur 10/27/2017 NEGATIVE  NEGATIVE mg/dL Final  . Nitrite 10/27/2017 NEGATIVE  NEGATIVE Final  . Leukocytes, UA 10/27/2017 NEGATIVE  NEGATIVE Final  . RBC / HPF 10/27/2017 >50* 0 - 5 RBC/hpf Final  . WBC, UA 10/27/2017 0-5  0 - 5 WBC/hpf Final  . Bacteria, UA 10/27/2017 NONE SEEN  NONE SEEN Final  . Squamous Epithelial / LPF 10/27/2017 0-5  0 - 5 Final  . Mucus 10/27/2017 PRESENT   Final   Performed at [facility], [address], [place], [place] [address]  Infusion on 10/25/2017  Component Date Value Ref Range Status  . Magnesium 10/25/2017 1.4* 1.7 - 2.4 mg/dL Final   Performed at [facility], [address] [employer], [place] [address]  . Alk Phos 10/25/2017 269* 39 - 117 IU/L Final  . Alk Phos Bone Fract 10/25/2017 31  14 - 68 % Final  . Alk Phos Liver Fract 10/25/2017 66  18 - 85 % Final  . Intestinal % 10/25/2017 3  0 - 18 % Final   Comment: (NOTE) Performed At: [facility] [place], [place] [id number] Rush Farmer MD [id number] Performed at [facility], [address], [place], [place] [address]   . Sed Rate 10/25/2017 106* 0 - 30 mm/hr Final   Performed at [facility], [address] [place],  [address]  .  Iron 10/25/2017 27* 28 - 170 ug/dL Final  . TIBC 10/25/2017 277  250 - 450 ug/dL Final  .  Saturation Ratios 10/25/2017 10* 10.4 - 31.8 % Final  . UIBC 10/25/2017 250  ug/dL Final   Performed at [facility], [address] [place], [place] [address]  . Ferritin 10/25/2017 135  11 - 307 ng/mL Final   Performed at [facility], [address] [place], [place] [address]    Assessment:  TZIPPORAH NAGORSKI is a 72 y.o. female with progressive stage IV ovarian cancer.  She presented with abdominal discomfort and bloating.  Omental biopsy on 02/23/2015 revealed metastatic high grade serous carcinoma, consistent with gynecologic origin.   She was initially diagnosed with clinical stage IIIC (T3cN1Mx).  CA125 was 707 on 02/17/2015.  She received 4 cycles of neoadjuvant carboplatin and Taxol (03/05/2015 - 05/22/2015).  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  She had loose stools on oral magnesium.  She was initially on Neurontin then switched Lyrica with cycle #3.  Cycle #4 was notable for neutropenia (ANC 300) requiring GCSF x 3 days.    She underwent exploratory laparotomy, lysis of adhesions, total abdominal hysterectomy with bilateral salpingo-oophorectomy, infracolic omentectomy, optimal tumor debulking(< 1 cm), recto-sigmoid resection with creation of end colostomy, cholecystectomy, mobilization of splenic flexure and liver with diaphragmatic stripping on 06/15/2015. The right diaphragm was cleared of tumor. During dissection, the diaphragm was entered and closed with sutures.  She received tamoxifen from 02/25/2016 - 03/14/2016.  She received 4 cycles of carboplatin and gemcitabine (03/21/2016 - 05/24/2016) with GCSF/Neulasta support.  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  Cymbalta began on 02/24/2017  PDL-1 testing revealed a combined score was 5 (>=1 positive).     [facility] genetic testing negative for APC, ATM, BMPR1A, BRCA1, BRCA2, BRIP1, CDH1, CDK4, CDKN2A, CHEK2, EPCAM, MLH1, MSH2, MSH6, MUTYH, NBN, PALB2, PMS2, PTEN, RAD51C, RAD51D,  SMAD4, STK11, TP53. Sequencing for select regions of POLE and POLD1, and large rearrangement analysis for select regions of GREM1 were negative.  She received 2 cycles of Doxil (06/21/2016 - 07/19/2016) with Neulasta support.  She received 4 cycles of topotecan with Neulasta support (08/22/2016 - 10/10/2016).    She received 3 cycles of pembrolizumab (Keytruda) from 12/06/2016 -  01/09/2017.  Cycle #1 was complicated by neutropenia (ANC 400) requiring GCSF/Granix x 3 days.  She received GCSF x 5 days with cycle #2.  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 Right lower chest pleural metastasis and several of the peritoneal tumor implants had increased.  There was new right inguinal lymphadenopathy.  Some of the peritoneal tumor implants had decreased.  The small dependent left pleural effusion was slightly increased .  The mild left hydroureteronephrosis was improved.  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   The chronic biliary ductal dilatation was stable .  There was no evidence of bowel obstruction.  CA125 was 802.9 on 03/30/2015, 567.9 on 04/13/2015, 168.8 on 05/15/2015, 85.2 on 07/17/2015, 68.6 on 07/28/2015, 34.5 on 09/17/2015, 20.7 on 11/06/2015, 49 on 01/22/2016, 106.1 on 02/22/2016, 138.8 on 03/07/2016, 220.8 on 03/21/2016, 152.8 on 04/11/2016, 125.6 on 04/18/2016, 76.8 on 05/03/2016, 60.2 on 05/24/2016, 44 on 07/19/2016, 65.8 on 08/17/2016, 53.4 on 09/12/2016, 47.3 on 10/10/2016, 91 on 12/20/2016, 141.1 on 01/09/2017, 463.7 on 02/27/2017, 907.1 on 03/27/2017, 192.1 on 05/22/2017, 189.1 on  05/24/2017, and 144.3 on 06/19/2017.  She has a history of recurrent right sided pleural effusion.  She underwent thoracentesis of 650 cc on post-operative day 3.  She was admitted to [facility] on 07/01/2015 and 07/07/2015 for recurrent shortness of breath.  She  underwent 2 additional thoracenteses (1.1 L on 07/02/2015 and 850 cc on 07/08/2015).  Cytology was negative x 2.  Bilateral lower extremity duplex on 07/03/2015 was negative.  Echo revealed an EF of 55-60% on 07/08/2015 and 60-65% on 05/27/2016.    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 She was on Lovenox 60 mg twice a day.  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  She has severe rheumatoid arthritis.  Methotrexate and Enbrel were initially on hold.  She has a normocytic anemia.  Work-up on 02/17/2015 and 05/24/2016 revealed a normal ferritin, B12, folate, TSH.  She denies any melena or hematochezia.    She has anemia due to chronic disease. She received 1 unit PRBCs during her admission at [facility]. She denies any melena or hematochezia. She has diabetes and is on an insulin pump.  She has had persistent neutropenia felt secondary to her rheumatoid arthritis.  Folate and MMA were normal.  TSH was 6.13 (high) with a free T4 of 1.17 (0.61-1.12).  She began methotrexate (10 mg a week) and prednisone (5 mg a day) for severe rheumatoid arthritis on 12/17/2015.  She remains on prednisone alone (5 mg a day).  Bone marrow aspirate and biopsy on 06/09/2010 revealed a hypercellular marrow (70%) with no evidence of dysplasia or malignancy.  Flow cytometry was negative.  Cytogenetics were normal (46,XX).  FISH studies were negative for MDS.  Bone marrow aspirate and biopsy on 12/03/2015 revealed a normocellular to mildy hypercellular marrow for age (40%) with left shifted myelopoiesis, non specific dyserythropoiesis and mild megakaryocytic atypia with no increase in blasts.  There were multiple small nonspecific lymphoid aggregates (favor reactive). There was no increase in reticulin.  There was decreased myeloid cells  (37%) with left shifted maturation and 1% atypical myelod blasts.  There was relatively increased monocytic cells (11%), relatively increased lymphoid cells (36%), and relatively increased eosinophils (6%).  Cytogenetics were normal (48, XX).  SNP microarray was normal.  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  She receives IV magnesium twice weekly.    She has chemotherapy induced anemia.  She has received Procrit (last 06/26/2017).  She received 1 unit of PRBCs on 06/05/2017.  Labs on 08/22/2016 revealed a normal B12 and folate.  Ferritin was 201, iron saturation 21% and TIBC 307 on 06/09/2017.  B12 and folate were normal.  Code status is DNR/DNI.  She developed acute LFT elevation on 11/28/2016.  Event was preceded by upper abdominal discomfort.  Hepatitis B core antibody total and hepatitis C antibody were negative.  She received 5 cycles of Abraxane (03/13/2017 - 06/26/2017).  Cycle #1 was truncated after day 1 secondary to side effects and Thanksgiving.  Chest, abdomen and pelvic CT on 07/10/2017 revealed progression of disease in the abdomen pelvis.  There was interval increase in size of hepatic lesions most noticeably in the LEFT hepatic lobe.  There was interval increase in bilateral hydronephrosis and hydroureter.  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  There was interval increase in size  of large peritoneal and pelvic peritoneal implants.  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  She had increased LFTs on 10/23/2017.  RUQ ultrasound on 10/24/2017 revealed multiple liver metastasis, the largest of 5.2 cm. There was no hydronephrosis.  There was a right pleural effusion.  She underwent bilateral ureteral stent placement on 07/13/2017.  She was admitted to Hospice on 07/15/2017.  Symptomatically, she feels "the same".  Weight fluctuates.  Exam reveals increasing abdominal  nodularity.  LFTs are elevated.  Magnesium is 1.4.  Plan: 1.  Labs today: CBC with diff, CMP, Mg, TSH. 2.  Discuss chronic HYPOmagnesemia. Magnesium refractory to frequent replacement. Magnesium level 1.4. Will replace with 6 grams intravenous magnesium today.   Discuss continuation of new oral magnesium product (magnesium glyconate) that does not appear associated with significant diarrhea.  Discuss contacting clinic if she has diarrhea.  Continue magnesium checks and if working should see decrease in IV magnesium needs. 3.  Discuss increased LFTs and increasing liver metastasis.  No biliary ductal dilatation. 4.  Discuss anemia.  Continue oral iron.  No transfusion needed at this time.  Check CBC monthly. 5.  Discuss ongoing symptom management including pain medications and antiemetics. Continue to coordinate care with Hospice. 6.  Encourage caloric intake.  Suspect weight loss due to progressive tumor.  TSH was normal on 10/02/2017. 7.  RTC on Mondays and Fridays x 3 weeks for labs (Mg) and +/- Mg.  8.  RTC in 1 month for MD assessment and labs (CBC with diff, CMP, Mg), and +/- Mg.    Honor Loh, NP [name], 1:02 PM   I saw and evaluated the patient, participating in the key portions of the service and reviewing pertinent diagnostic studies and records.  I reviewed the nurse practitioner's note and agree with the findings and the plan.  The assessment and plan were discussed with the patient.  A few questions were asked by the patient and answered.   Nolon Stalls, MD [name], 1:02 PM

## 2017-10-27 NOTE — Progress Notes (Signed)
Magnesium: 1.4. Honor Loh, NP, notified and aware. Per NP order: releasesupportive therapy planorderforMagnesium Sulfate 6g IVand proceed toinfusetoday.

## 2017-10-27 NOTE — Progress Notes (Signed)
Patient here today to discuss [place] and CXR results.

## 2017-10-28 ENCOUNTER — Encounter: Payer: Self-pay | Admitting: Hematology and Oncology

## 2017-10-28 DIAGNOSIS — D500 Iron deficiency anemia secondary to blood loss (chronic): Secondary | ICD-10-CM | POA: Insufficient documentation

## 2017-10-28 LAB — URINE CULTURE: Culture: <10000 — AB

## 2017-10-30 ENCOUNTER — Inpatient Hospital Stay: Attending: Hematology and Oncology

## 2017-10-30 ENCOUNTER — Inpatient Hospital Stay

## 2017-10-30 DIAGNOSIS — I2510 Atherosclerotic heart disease of native coronary artery without angina pectoris: Secondary | ICD-10-CM | POA: Insufficient documentation

## 2017-10-30 DIAGNOSIS — I10 Essential (primary) hypertension: Secondary | ICD-10-CM | POA: Insufficient documentation

## 2017-10-30 DIAGNOSIS — M129 Arthropathy, unspecified: Secondary | ICD-10-CM | POA: Diagnosis not present

## 2017-10-30 DIAGNOSIS — R6881 Early satiety: Secondary | ICD-10-CM | POA: Diagnosis not present

## 2017-10-30 DIAGNOSIS — C569 Malignant neoplasm of unspecified ovary: Secondary | ICD-10-CM | POA: Insufficient documentation

## 2017-10-30 DIAGNOSIS — E785 Hyperlipidemia, unspecified: Secondary | ICD-10-CM | POA: Diagnosis not present

## 2017-10-30 DIAGNOSIS — Z794 Long term (current) use of insulin: Secondary | ICD-10-CM | POA: Insufficient documentation

## 2017-10-30 DIAGNOSIS — C800 Disseminated malignant neoplasm, unspecified: Secondary | ICD-10-CM

## 2017-10-30 DIAGNOSIS — Z9641 Presence of insulin pump (external) (internal): Secondary | ICD-10-CM | POA: Insufficient documentation

## 2017-10-30 DIAGNOSIS — M069 Rheumatoid arthritis, unspecified: Secondary | ICD-10-CM | POA: Diagnosis not present

## 2017-10-30 DIAGNOSIS — J90 Pleural effusion, not elsewhere classified: Secondary | ICD-10-CM | POA: Diagnosis not present

## 2017-10-30 DIAGNOSIS — Z79899 Other long term (current) drug therapy: Secondary | ICD-10-CM | POA: Diagnosis not present

## 2017-10-30 DIAGNOSIS — I252 Old myocardial infarction: Secondary | ICD-10-CM | POA: Insufficient documentation

## 2017-10-30 DIAGNOSIS — I509 Heart failure, unspecified: Secondary | ICD-10-CM | POA: Diagnosis not present

## 2017-10-30 DIAGNOSIS — R112 Nausea with vomiting, unspecified: Secondary | ICD-10-CM | POA: Diagnosis not present

## 2017-10-30 DIAGNOSIS — R634 Abnormal weight loss: Secondary | ICD-10-CM | POA: Insufficient documentation

## 2017-10-30 DIAGNOSIS — G629 Polyneuropathy, unspecified: Secondary | ICD-10-CM | POA: Insufficient documentation

## 2017-10-30 DIAGNOSIS — C561 Malignant neoplasm of right ovary: Secondary | ICD-10-CM

## 2017-10-30 DIAGNOSIS — C782 Secondary malignant neoplasm of pleura: Secondary | ICD-10-CM | POA: Insufficient documentation

## 2017-10-30 DIAGNOSIS — E109 Type 1 diabetes mellitus without complications: Secondary | ICD-10-CM | POA: Insufficient documentation

## 2017-10-30 DIAGNOSIS — N1330 Unspecified hydronephrosis: Secondary | ICD-10-CM | POA: Insufficient documentation

## 2017-10-30 DIAGNOSIS — C786 Secondary malignant neoplasm of retroperitoneum and peritoneum: Secondary | ICD-10-CM | POA: Insufficient documentation

## 2017-10-30 DIAGNOSIS — Z801 Family history of malignant neoplasm of trachea, bronchus and lung: Secondary | ICD-10-CM | POA: Insufficient documentation

## 2017-10-30 DIAGNOSIS — D709 Neutropenia, unspecified: Secondary | ICD-10-CM | POA: Insufficient documentation

## 2017-10-30 DIAGNOSIS — Z87891 Personal history of nicotine dependence: Secondary | ICD-10-CM | POA: Insufficient documentation

## 2017-10-30 DIAGNOSIS — Z90710 Acquired absence of both cervix and uterus: Secondary | ICD-10-CM | POA: Insufficient documentation

## 2017-10-30 DIAGNOSIS — Z90722 Acquired absence of ovaries, bilateral: Secondary | ICD-10-CM | POA: Insufficient documentation

## 2017-10-30 DIAGNOSIS — D649 Anemia, unspecified: Secondary | ICD-10-CM | POA: Insufficient documentation

## 2017-10-30 DIAGNOSIS — R748 Abnormal levels of other serum enzymes: Secondary | ICD-10-CM | POA: Insufficient documentation

## 2017-10-30 DIAGNOSIS — K219 Gastro-esophageal reflux disease without esophagitis: Secondary | ICD-10-CM | POA: Diagnosis not present

## 2017-10-30 DIAGNOSIS — R590 Localized enlarged lymph nodes: Secondary | ICD-10-CM | POA: Insufficient documentation

## 2017-10-30 LAB — MAGNESIUM: Magnesium: 1.7 mg/dL (ref 1.7–2.4)

## 2017-10-30 MED ORDER — SODIUM CHLORIDE 0.9% FLUSH
10.0000 mL | Freq: Once | INTRAVENOUS | Status: AC
  Administered 2017-10-30: 10 mL via INTRAVENOUS
  Filled 2017-10-30: qty 10

## 2017-10-30 MED ORDER — HEPARIN SOD (PORK) LOCK FLUSH 100 UNIT/ML IV SOLN
500.0000 [IU] | Freq: Once | INTRAVENOUS | Status: AC
  Administered 2017-10-30: 500 [IU] via INTRAVENOUS

## 2017-10-30 NOTE — Progress Notes (Signed)
Mg 1.7 today. No treatment needed

## 2017-10-31 ENCOUNTER — Other Ambulatory Visit: Payer: Self-pay | Admitting: Family Medicine

## 2017-10-31 ENCOUNTER — Other Ambulatory Visit: Payer: Medicare Other

## 2017-10-31 DIAGNOSIS — N1330 Unspecified hydronephrosis: Secondary | ICD-10-CM

## 2017-10-31 LAB — URINALYSIS, COMPLETE
BILIRUBIN UA: NEGATIVE
NITRITE UA: NEGATIVE
SPEC GRAV UA: 1.015 (ref 1.005–1.030)
UUROB: 1 mg/dL (ref 0.2–1.0)
pH, UA: 6.5 (ref 5.0–7.5)

## 2017-10-31 LAB — MICROSCOPIC EXAMINATION: WBC, UA: NONE SEEN /hpf (ref 0–5)

## 2017-11-01 ENCOUNTER — Ambulatory Visit: Payer: Medicare Other

## 2017-11-01 ENCOUNTER — Other Ambulatory Visit: Payer: Medicare Other

## 2017-11-03 ENCOUNTER — Inpatient Hospital Stay

## 2017-11-03 ENCOUNTER — Other Ambulatory Visit: Payer: Self-pay | Admitting: Hematology and Oncology

## 2017-11-03 DIAGNOSIS — C800 Disseminated malignant neoplasm, unspecified: Principal | ICD-10-CM

## 2017-11-03 DIAGNOSIS — C561 Malignant neoplasm of right ovary: Secondary | ICD-10-CM

## 2017-11-03 DIAGNOSIS — C569 Malignant neoplasm of unspecified ovary: Secondary | ICD-10-CM | POA: Diagnosis not present

## 2017-11-03 LAB — MAGNESIUM: Magnesium: 1.6 mg/dL — ABNORMAL LOW (ref 1.7–2.4)

## 2017-11-03 LAB — CULTURE, URINE COMPREHENSIVE

## 2017-11-03 MED ORDER — HEPARIN SOD (PORK) LOCK FLUSH 100 UNIT/ML IV SOLN
500.0000 [IU] | Freq: Once | INTRAVENOUS | Status: AC
  Administered 2017-11-03: 500 [IU] via INTRAVENOUS
  Filled 2017-11-03: qty 5

## 2017-11-03 MED ORDER — HEPARIN SOD (PORK) LOCK FLUSH 100 UNIT/ML IV SOLN
INTRAVENOUS | Status: AC
  Filled 2017-11-03: qty 5

## 2017-11-03 MED ORDER — MAGNESIUM SULFATE 2 GM/50ML IV SOLN
2.0000 g | Freq: Once | INTRAVENOUS | Status: AC
  Administered 2017-11-03: 2 g via INTRAVENOUS
  Filled 2017-11-03: qty 50

## 2017-11-03 MED ORDER — SODIUM CHLORIDE 0.9% FLUSH
10.0000 mL | Freq: Once | INTRAVENOUS | Status: AC
  Administered 2017-11-03: 10 mL via INTRAVENOUS
  Filled 2017-11-03: qty 10

## 2017-11-03 MED ORDER — SODIUM CHLORIDE 0.9 % IV SOLN: 2.0000 g | Freq: Once | INTRAVENOUS | Status: DC

## 2017-11-03 NOTE — Progress Notes (Signed)
Patient's Mg 1.6 today. Per Dr Mike Gip patient to receive 2g Mg today.

## 2017-11-06 ENCOUNTER — Inpatient Hospital Stay

## 2017-11-06 VITALS — BP 138/70 | HR 85 | Temp 96.9°F | Resp 16

## 2017-11-06 DIAGNOSIS — C561 Malignant neoplasm of right ovary: Secondary | ICD-10-CM

## 2017-11-06 DIAGNOSIS — C800 Disseminated malignant neoplasm, unspecified: Principal | ICD-10-CM

## 2017-11-06 DIAGNOSIS — C569 Malignant neoplasm of unspecified ovary: Secondary | ICD-10-CM | POA: Diagnosis not present

## 2017-11-06 LAB — MAGNESIUM: Magnesium: 1.5 mg/dL — ABNORMAL LOW (ref 1.7–2.4)

## 2017-11-06 MED ORDER — SODIUM CHLORIDE 0.9 % IV SOLN
Freq: Once | INTRAVENOUS | Status: AC
  Administered 2017-11-06: 10:00:00 via INTRAVENOUS
  Filled 2017-11-06: qty 1000

## 2017-11-06 MED ORDER — MAGNESIUM SULFATE 50 % IJ SOLN: 2.0000 g | Freq: Once | INTRAMUSCULAR | Status: DC

## 2017-11-06 MED ORDER — HEPARIN SOD (PORK) LOCK FLUSH 100 UNIT/ML IV SOLN
500.0000 [IU] | Freq: Once | INTRAVENOUS | Status: AC
  Administered 2017-11-06: 500 [IU] via INTRAVENOUS
  Filled 2017-11-06: qty 5

## 2017-11-06 MED ORDER — SODIUM CHLORIDE 0.9% FLUSH
10.0000 mL | INTRAVENOUS | Status: DC | PRN
  Administered 2017-11-06: 10 mL via INTRAVENOUS
  Filled 2017-11-06: qty 10

## 2017-11-06 MED ORDER — MAGNESIUM SULFATE 2 GM/50ML IV SOLN
2.0000 g | Freq: Once | INTRAVENOUS | Status: AC
  Administered 2017-11-06: 2 g via INTRAVENOUS
  Filled 2017-11-06: qty 50

## 2017-11-06 NOTE — Progress Notes (Signed)
Mag level 1.5 today.  Pt to get mag 2 grams IV today per Honor Loh NP.

## 2017-11-07 ENCOUNTER — Encounter
Admission: RE | Admit: 2017-11-07 | Discharge: 2017-11-07 | Disposition: A | Discharge status: Home / Self Care | Source: Ambulatory Visit | Attending: Urology | Admitting: Urology

## 2017-11-07 ENCOUNTER — Other Ambulatory Visit: Payer: Self-pay

## 2017-11-07 DIAGNOSIS — I10 Essential (primary) hypertension: Secondary | ICD-10-CM | POA: Insufficient documentation

## 2017-11-07 DIAGNOSIS — I2510 Atherosclerotic heart disease of native coronary artery without angina pectoris: Secondary | ICD-10-CM | POA: Insufficient documentation

## 2017-11-07 DIAGNOSIS — Z01812 Encounter for preprocedural laboratory examination: Secondary | ICD-10-CM | POA: Diagnosis present

## 2017-11-07 LAB — BASIC METABOLIC PANEL
Anion gap: 8 (ref 5–15)
BUN: 21 mg/dL (ref 8–23)
CHLORIDE: 95 mmol/L — AB (ref 98–111)
CO2: 32 mmol/L (ref 22–32)
CREATININE: 0.68 mg/dL (ref 0.44–1.00)
Calcium: 9.4 mg/dL (ref 8.9–10.3)
GFR calc Af Amer: >60 mL/min (ref >60)
GFR calc non Af Amer: >60 mL/min (ref >60)
Glucose, Bld: 216 mg/dL — ABNORMAL HIGH (ref 70–99)
Potassium: 4.6 mmol/L (ref 3.5–5.1)
Sodium: 135 mmol/L (ref 135–145)

## 2017-11-07 LAB — CBC
HEMATOCRIT: 33.4 % — AB (ref 35.0–47.0)
HEMOGLOBIN: 11 g/dL — AB (ref 12.0–16.0)
MCH: 26.5 pg (ref 26.0–34.0)
MCHC: 32.8 g/dL (ref 32.0–36.0)
MCV: 80.6 fL (ref 80.0–100.0)
Platelets: 320 10*3/uL (ref 150–440)
RBC: 4.15 MIL/uL (ref 3.80–5.20)
RDW: 21.4 % — ABNORMAL HIGH (ref 11.5–14.5)
WBC: 3.5 10*3/uL — ABNORMAL LOW (ref 3.6–11.0)

## 2017-11-07 LAB — DIFFERENTIAL
Basophils Absolute: 0 10*3/uL (ref 0–0.1)
Basophils Relative: 1 %
Eosinophils Absolute: 0 10*3/uL (ref 0–0.7)
Eosinophils Relative: 0 %
LYMPHS ABS: 0.3 10*3/uL — AB (ref 1.0–3.6)
LYMPHS PCT: 9 %
MONO ABS: 0.4 10*3/uL (ref 0.2–0.9)
Monocytes Relative: 12 %
Neutro Abs: 2.7 10*3/uL (ref 1.4–6.5)
Neutrophils Relative %: 78 %

## 2017-11-07 NOTE — Patient Instructions (Signed)
Your procedure is scheduled on:  Mon 7/15 Report to Day Surgery. To find out your arrival time please call [phone] between 1PM - 3PM on Friday 7/12.  Remember: Instructions that are not followed completely may result in serious medical risk,  up to and including death, or upon the discretion of your surgeon and anesthesiologist your  surgery may need to be rescheduled.     _X__ 1. Do not eat food after midnight the night before your procedure.                 No gum chewing or hard candies. You may drink clear liquids up to 2 hours                 before you are scheduled to arrive for your surgery- DO not drink clear                 liquids within 2 hours of the start of your surgery.                 Clear Liquids include:  water,  __X__2.  On the morning of surgery brush your teeth with toothpaste and water, you                may rinse your mouth with mouthwash if you wish.  Do not swallow any toothpaste of mouthwash.     _X__ 3.  No Alcohol for 24 hours before or after surgery.   _X__ 4.  Do Not Smoke or use e-cigarettes For 24 Hours Prior to Your Surgery.                 Do not use any chewable tobacco products for at least 6 hours prior to                 surgery.  ____  5.  Bring all medications with you on the day of surgery if instructed.   __x__  6.  Notify your doctor if there is any change in your medical condition      (cold, fever, infections).     Do not wear jewelry, make-up, hairpins, clips or nail polish. Do not wear lotions, powders, or perfumes. You may wear deodorant. Do not shave 48 hours prior to surgery. Men may shave face and neck. Do not bring valuables to the hospital.    [facility] is not responsible for any belongings or valuables.  Contacts, dentures or bridgework may not be worn into surgery. Leave your suitcase in the car. After surgery it may be brought to your room. For patients admitted to the hospital, discharge  time is determined by your treatment team.   Patients discharged the day of surgery will not be allowed to drive home.   Please read over the following fact sheets that you were given:     __x__ Take these medicines the morning of surgery with A SIP OF WATER:    1. pantoprazole (PROTONIX) 40 MG tablet  2. predniSONE (DELTASONE) 5 MG tablet  3.   4.  5.  6.  ____ Fleet Enema (as directed)   ____ Use CHG Soap as directed  ____ Use inhalers on the day of surgery  ____ Stop metformin 2 days prior to surgery    __x__ Continue basa rate of insulin.  No bolus __x__ Stop  enoxaparin (LOVENOX) 60 MG/0.6ML injection none night before or morning of surgery.  Resume after surgery  ____ Stop Anti-inflammatories on  __x__ Stop supplements until after surgery.  vitamin C (ASCORBIC ACID) 500 MG tablet today  ____ Bring C-Pap to the hospital.

## 2017-11-10 ENCOUNTER — Inpatient Hospital Stay

## 2017-11-10 ENCOUNTER — Ambulatory Visit: Payer: Medicare Other | Admitting: Family Medicine

## 2017-11-10 DIAGNOSIS — C561 Malignant neoplasm of right ovary: Secondary | ICD-10-CM

## 2017-11-10 DIAGNOSIS — C569 Malignant neoplasm of unspecified ovary: Secondary | ICD-10-CM | POA: Diagnosis not present

## 2017-11-10 DIAGNOSIS — C800 Disseminated malignant neoplasm, unspecified: Principal | ICD-10-CM

## 2017-11-10 LAB — MAGNESIUM: Magnesium: 1.6 mg/dL — ABNORMAL LOW (ref 1.7–2.4)

## 2017-11-10 MED ORDER — SODIUM CHLORIDE 0.9 % IV SOLN: 2.0000 g | Freq: Once | INTRAVENOUS | Status: DC

## 2017-11-10 MED ORDER — MAGNESIUM SULFATE 2 GM/50ML IV SOLN
2.0000 g | Freq: Once | INTRAVENOUS | Status: AC
  Administered 2017-11-10: 2 g via INTRAVENOUS
  Filled 2017-11-10: qty 50

## 2017-11-10 MED ORDER — HEPARIN SOD (PORK) LOCK FLUSH 100 UNIT/ML IV SOLN
500.0000 [IU] | Freq: Once | INTRAVENOUS | Status: AC
  Administered 2017-11-10: 500 [IU] via INTRAVENOUS

## 2017-11-13 ENCOUNTER — Ambulatory Visit
Admission: RE | Admit: 2017-11-13 | Discharge: 2017-11-13 | Disposition: A | Discharge status: Home / Self Care | Source: Ambulatory Visit | Attending: Urology | Admitting: Urology

## 2017-11-13 ENCOUNTER — Ambulatory Visit: Admitting: Certified Registered Nurse Anesthetist

## 2017-11-13 ENCOUNTER — Encounter
Admission: RE | Disposition: A | Discharge status: Home / Self Care | Payer: Self-pay | Source: Ambulatory Visit | Attending: Urology

## 2017-11-13 ENCOUNTER — Encounter: Payer: Self-pay | Admitting: Anesthesiology

## 2017-11-13 DIAGNOSIS — Z91040 Latex allergy status: Secondary | ICD-10-CM | POA: Diagnosis not present

## 2017-11-13 DIAGNOSIS — E1040 Type 1 diabetes mellitus with diabetic neuropathy, unspecified: Secondary | ICD-10-CM | POA: Insufficient documentation

## 2017-11-13 DIAGNOSIS — I509 Heart failure, unspecified: Secondary | ICD-10-CM | POA: Insufficient documentation

## 2017-11-13 DIAGNOSIS — F329 Major depressive disorder, single episode, unspecified: Secondary | ICD-10-CM | POA: Insufficient documentation

## 2017-11-13 DIAGNOSIS — C569 Malignant neoplasm of unspecified ovary: Secondary | ICD-10-CM | POA: Diagnosis not present

## 2017-11-13 DIAGNOSIS — Z87891 Personal history of nicotine dependence: Secondary | ICD-10-CM | POA: Insufficient documentation

## 2017-11-13 DIAGNOSIS — M1990 Unspecified osteoarthritis, unspecified site: Secondary | ICD-10-CM | POA: Insufficient documentation

## 2017-11-13 DIAGNOSIS — E785 Hyperlipidemia, unspecified: Secondary | ICD-10-CM | POA: Diagnosis not present

## 2017-11-13 DIAGNOSIS — N138 Other obstructive and reflux uropathy: Secondary | ICD-10-CM | POA: Diagnosis not present

## 2017-11-13 DIAGNOSIS — I110 Hypertensive heart disease with heart failure: Secondary | ICD-10-CM | POA: Diagnosis not present

## 2017-11-13 DIAGNOSIS — Z9221 Personal history of antineoplastic chemotherapy: Secondary | ICD-10-CM | POA: Insufficient documentation

## 2017-11-13 DIAGNOSIS — Z885 Allergy status to narcotic agent status: Secondary | ICD-10-CM | POA: Diagnosis not present

## 2017-11-13 DIAGNOSIS — Z79899 Other long term (current) drug therapy: Secondary | ICD-10-CM | POA: Insufficient documentation

## 2017-11-13 DIAGNOSIS — N1339 Other hydronephrosis: Secondary | ICD-10-CM | POA: Diagnosis present

## 2017-11-13 DIAGNOSIS — I2510 Atherosclerotic heart disease of native coronary artery without angina pectoris: Secondary | ICD-10-CM | POA: Diagnosis not present

## 2017-11-13 DIAGNOSIS — Z951 Presence of aortocoronary bypass graft: Secondary | ICD-10-CM | POA: Insufficient documentation

## 2017-11-13 DIAGNOSIS — Z794 Long term (current) use of insulin: Secondary | ICD-10-CM | POA: Insufficient documentation

## 2017-11-13 DIAGNOSIS — N1330 Unspecified hydronephrosis: Secondary | ICD-10-CM | POA: Diagnosis not present

## 2017-11-13 HISTORY — DX: Polyneuropathy in diseases classified elsewhere: G63

## 2017-11-13 HISTORY — PX: CYSTOSCOPY W/ RETROGRADES: SHX1426

## 2017-11-13 HISTORY — PX: CYSTOSCOPY W/ URETERAL STENT PLACEMENT: SHX1429

## 2017-11-13 HISTORY — DX: Malignant (primary) neoplasm, unspecified: C80.1

## 2017-11-13 LAB — GLUCOSE, CAPILLARY
GLUCOSE-CAPILLARY: 75 mg/dL (ref 70–99)
GLUCOSE-CAPILLARY: 68 mg/dL — AB (ref 70–99)
GLUCOSE-CAPILLARY: 207 mg/dL — AB (ref 70–99)
GLUCOSE-CAPILLARY: 193 mg/dL — AB (ref 70–99)
Glucose-Capillary: 248 mg/dL — ABNORMAL HIGH (ref 70–99)

## 2017-11-13 SURGERY — CYSTOSCOPY, FLEXIBLE, WITH STENT REPLACEMENT
Anesthesia: General | Site: Ureter | Laterality: Left | Wound class: Clean Contaminated

## 2017-11-13 MED ORDER — PROPOFOL 10 MG/ML IV BOLUS
INTRAVENOUS | Status: DC | PRN
  Administered 2017-11-13: 160 mg via INTRAVENOUS

## 2017-11-13 MED ORDER — PROPOFOL 10 MG/ML IV BOLUS
INTRAVENOUS | Status: AC
  Filled 2017-11-13: qty 60

## 2017-11-13 MED ORDER — CEFAZOLIN SODIUM-DEXTROSE 2-4 GM/100ML-% IV SOLN
INTRAVENOUS | Status: AC
  Filled 2017-11-13: qty 100

## 2017-11-13 MED ORDER — FENTANYL CITRATE (PF) 100 MCG/2ML IJ SOLN
INTRAMUSCULAR | Status: AC
  Filled 2017-11-13: qty 2

## 2017-11-13 MED ORDER — SODIUM CHLORIDE 0.9 % IV SOLN
INTRAVENOUS | Status: DC
  Administered 2017-11-13: 50 mL/h via INTRAVENOUS
  Administered 2017-11-13: 11:00:00 via INTRAVENOUS

## 2017-11-13 MED ORDER — FENTANYL CITRATE (PF) 100 MCG/2ML IJ SOLN
INTRAMUSCULAR | Status: DC | PRN
  Administered 2017-11-13: 25 ug via INTRAVENOUS

## 2017-11-13 MED ORDER — CEFAZOLIN SODIUM-DEXTROSE 2-4 GM/100ML-% IV SOLN
2.0000 g | INTRAVENOUS | Status: AC
  Administered 2017-11-13: 2 g via INTRAVENOUS

## 2017-11-13 MED ORDER — ONDANSETRON HCL 4 MG/2ML IJ SOLN
INTRAMUSCULAR | Status: DC | PRN
  Administered 2017-11-13: 4 mg via INTRAVENOUS

## 2017-11-13 MED ORDER — ONDANSETRON HCL 4 MG/2ML IJ SOLN: 4.0000 mg | Freq: Once | INTRAMUSCULAR | Status: DC | PRN

## 2017-11-13 MED ORDER — DEXTROSE 50 % IV SOLN
INTRAVENOUS | Status: AC
  Filled 2017-11-13: qty 50

## 2017-11-13 MED ORDER — DEXTROSE-NACL 5-0.9 % IV SOLN: INTRAVENOUS | Status: DC

## 2017-11-13 MED ORDER — SUCCINYLCHOLINE CHLORIDE 20 MG/ML IJ SOLN
INTRAMUSCULAR | Status: AC
  Filled 2017-11-13: qty 1

## 2017-11-13 MED ORDER — FENTANYL CITRATE (PF) 100 MCG/2ML IJ SOLN: 25.0000 ug | INTRAMUSCULAR | Status: DC | PRN

## 2017-11-13 MED ORDER — PHENYLEPHRINE HCL 10 MG/ML IJ SOLN
INTRAMUSCULAR | Status: DC | PRN
  Administered 2017-11-13 (×2): 100 ug via INTRAVENOUS

## 2017-11-13 MED ORDER — DEXTROSE-NACL 5-0.45 % IV SOLN
INTRAVENOUS | Status: DC
  Administered 2017-11-13: 10:00:00 via INTRAVENOUS

## 2017-11-13 MED ORDER — IOTHALAMATE MEGLUMINE 43 % IV SOLN
INTRAVENOUS | Status: DC | PRN
  Administered 2017-11-13: 15 mL via URETHRAL

## 2017-11-13 MED ORDER — GLYCOPYRROLATE 0.2 MG/ML IJ SOLN
INTRAMUSCULAR | Status: DC | PRN
  Administered 2017-11-13: 0.2 mg via INTRAVENOUS

## 2017-11-13 MED ORDER — DEXTROSE 50 % IV SOLN: 25.0000 mL | Freq: Once | INTRAVENOUS | Status: DC

## 2017-11-13 MED ORDER — LIDOCAINE HCL (CARDIAC) PF 100 MG/5ML IV SOSY
PREFILLED_SYRINGE | INTRAVENOUS | Status: DC | PRN
  Administered 2017-11-13: 100 mg via INTRAVENOUS

## 2017-11-13 MED ORDER — DEXAMETHASONE SODIUM PHOSPHATE 10 MG/ML IJ SOLN
INTRAMUSCULAR | Status: DC | PRN
  Administered 2017-11-13: 4 mg via INTRAVENOUS

## 2017-11-13 SURGICAL SUPPLY — 22 items
BAG DRAIN CYSTO-URO LG1000N (MISCELLANEOUS) ×4 IMPLANT
BRUSH SCRUB EZ  4% CHG (MISCELLANEOUS)
BRUSH SCRUB EZ 4% CHG (MISCELLANEOUS) IMPLANT
CATH URETL 5X70 OPEN END (CATHETERS) ×4 IMPLANT
CONRAY 43 FOR UROLOGY 50M (MISCELLANEOUS) ×4 IMPLANT
GLOVE BIO SURGEON STRL SZ 6.5 (GLOVE) ×3 IMPLANT
GLOVE BIO SURGEONS STRL SZ 6.5 (GLOVE) ×1
GOWN STRL REUS W/ TWL LRG LVL3 (GOWN DISPOSABLE) ×4 IMPLANT
GOWN STRL REUS W/TWL LRG LVL3 (GOWN DISPOSABLE) ×4
KIT TURNOVER CYSTO (KITS) ×4 IMPLANT
PACK CYSTO AR (MISCELLANEOUS) ×4 IMPLANT
SENSORWIRE 0.038 NOT ANGLED (WIRE) ×4
SET CYSTO W/LG BORE CLAMP LF (SET/KITS/TRAYS/PACK) ×4 IMPLANT
SOL .9 NS 3000ML IRR  AL (IV SOLUTION) ×2
SOL .9 NS 3000ML IRR UROMATIC (IV SOLUTION) ×2 IMPLANT
STENT URET 6FRX24 CONTOUR (STENTS) IMPLANT
STENT URET 6FRX26 CONTOUR (STENTS) IMPLANT
STENT URO INLAY 6FRX24CM (STENTS) ×8 IMPLANT
SURGILUBE 2OZ TUBE FLIPTOP (MISCELLANEOUS) ×4 IMPLANT
SYRINGE IRR TOOMEY STRL 70CC (SYRINGE) ×4 IMPLANT
WATER STERILE IRR 1000ML POUR (IV SOLUTION) ×4 IMPLANT
WIRE SENSOR 0.038 NOT ANGLED (WIRE) ×2 IMPLANT

## 2017-11-13 NOTE — Anesthesia Post-op Follow-up Note (Signed)
Anesthesia QCDR form completed.        

## 2017-11-13 NOTE — Anesthesia Preprocedure Evaluation (Signed)
Anesthesia Evaluation  Patient identified by MRN, date of birth, ID band Patient awake    Reviewed: Allergy & Precautions, NPO status , Patient's Chart, lab work & pertinent test results, reviewed documented beta blocker date and time   Airway Mallampati: II  TM Distance: >3 FB     Dental  (+) Chipped   Pulmonary pneumonia, resolved, former smoker,           Cardiovascular hypertension, Pt. on medications and Pt. on home beta blockers + CAD, + Past MI, + CABG, + Peripheral Vascular Disease, +CHF and + DVT       Neuro/Psych PSYCHIATRIC DISORDERS Depression  Neuromuscular disease CVA, No Residual Symptoms    GI/Hepatic hiatal hernia, GERD  Controlled,  Endo/Other  diabetes, Type 1  Renal/GU Renal disease     Musculoskeletal  (+) Arthritis , Rheumatoid disorders,    Abdominal   Peds  Hematology  (+) anemia ,   Anesthesia Other Findings PACs.  Reproductive/Obstetrics                             Anesthesia Physical Anesthesia Plan  ASA: III  Anesthesia Plan: General   Post-op Pain Management:    Induction: Intravenous  PONV Risk Score and Plan:   Airway Management Planned: LMA  Additional Equipment:   Intra-op Plan:   Post-operative Plan:   Informed Consent: I have reviewed the patients History and Physical, chart, labs and discussed the procedure including the risks, benefits and alternatives for the proposed anesthesia with the patient or authorized representative who has indicated his/her understanding and acceptance.     Plan Discussed with: CRNA  Anesthesia Plan Comments:         Anesthesia Quick Evaluation

## 2017-11-13 NOTE — Interval H&P Note (Signed)
History and Physical Interval Note:  11/13/2017 9:46 AM  [name]  has presented today for surgery, with the diagnosis of bilateral hydronephrosis  The various methods of treatment have been discussed with the patient and family. After consideration of risks, benefits and other options for treatment, the patient has consented to  Procedure(s): CYSTOSCOPY WITH STENT REPLACEMENT (Exchange) (Bilateral) as a surgical intervention .  The patient's history has been reviewed, patient examined, no change in status, stable for surgery.  I have reviewed the patient's chart and labs.  Questions were answered to the patient's satisfaction.    rrr ctab  Hollice Espy

## 2017-11-13 NOTE — Discharge Instructions (Signed)
You have a ureteral stent in place.  This is a tube that extends from your kidney to your bladder.  This may cause urinary bleeding, burning with urination, and urinary frequency.  Please call our office or present to the ED if you develop fevers >101 or pain which is not able to be controlled with oral pain medications.  You may be given either Flomax and/ or ditropan to help with bladder spasms and stent pain in addition to pain medications.    [facility] [address], [place], [place] [address] [phone]   AMBULATORY SURGERY  DISCHARGE INSTRUCTIONS   1) The drugs that you were given will stay in your system until tomorrow so for the next 24 hours you should not:  A) Drive an automobile B) Make any legal decisions C) Drink any alcoholic beverage   2) You may resume regular meals tomorrow.  Today it is better to start with liquids and gradually work up to solid foods.  You may eat anything you prefer, but it is better to start with liquids, then soup and crackers, and gradually work up to solid foods.   3) Please notify your doctor immediately if you have any unusual bleeding, trouble breathing, redness and pain at the surgery site, drainage, fever, or pain not relieved by medication.    4) Additional Instructions: TAKE A STOOL SOFTENER TWICE A DAY WHILE TAKING NARCOTIC PAIN MEDICINE TO PREVENT CONSTIPATION   Please contact your physician with any problems or Same Day Surgery at [phone], Monday through Friday 6 am to 4 pm, or [place] at [facility] number at [phone].

## 2017-11-13 NOTE — Op Note (Signed)
Date of procedure: 11/13/17  Preoperative diagnosis:  1. Bilateral hydroureteronephrosis 2. Stage IV ovarian cancer  Postoperative diagnosis:  1. As above  Procedure: 1. Bilateral ureteral stent exchange 2. Left retrograde pyelogram  Surgeon: Hollice Espy, MD  Anesthesia: General  Complications: None  Intraoperative findings: Persistent tortuosity of the left ureter without hydronephrosis.  Unremarkable stent exchange.  Minimal to no stent encrustation appreciated.  EBL: None  Specimens: None  Drains: 6 x 24 French double-J ureteral stent, Bard Optima  Indication: [name] is a 72 y.o. patient with terminal metastatic ovarian cancer bilateral hydroureteronephrosis managed by indwelling ureteral stent.  She presents today for stent exchange..  After reviewing the management options for treatment, she elected to proceed with the above surgical procedure(s). We have discussed the potential benefits and risks of the procedure, side effects of the proposed treatment, the likelihood of the patient achieving the goals of the procedure, and any potential problems that might occur during the procedure or recuperation. Informed consent has been obtained.  Description of procedure:  The patient was taken to the operating room and general anesthesia was induced.  The patient was placed in the dorsal lithotomy position, prepped and draped in the usual sterile fashion, and preoperative antibiotics were administered. A preoperative time-out was performed.   A 21 French scope was advanced per urethra into the bladder.  Attention was turned to the left ureteral orifice from which a ureteral stent was seen emanating.  Notably, this UO was noted just on the edge of a widemouth bladder diverticulum.  The distal coil of the stent was grasped and brought to level the urethral meatus.  A wire was then placed through the lumen of the stent up to level of presumably the kidney.  The stent was  removed and the wire was left in place.  Due to her history of fairly significant tortuosity in order to confirm the position of the tip of the wire, did perform a retrograde pyelogram alongside of the wire to outline the collecting system.  Notably, there was residual tortuosity and dilation of the ureter but a decompressed upper tract collecting system without hydronephrosis.  A 6 x 24 French Bard Optima ureteral stent was then placed over the wire up to level the renal pelvis.  The wires partially drawn until focal stone within the renal pelvis.  Wire was then fully withdrawn a full coils noted within the bladder.  Next, the distal coil of the right-sided stent was grasped and brought to level urethral meatus.  This was cannulated using wire up to level of the kidney.  The stent was removed and a new 6 x 24 French double-J ureteral stent was then advanced over the wire up to level the kidney.  The wire was then fully withdrawn a focal stone within the renal pelvis as well as within the bladder in satisfactory position.  The bladder was then drained.  Patient was then clean and dry, repositioned in supine position, reversed from anesthesia, taken to PACU in stable condition.  Plan: Patient will return in 6 months pending her overall prognosis to discuss her next stent exchange.  Hollice Espy, M.D.

## 2017-11-13 NOTE — Transfer of Care (Signed)
Immediate Anesthesia Transfer of Care Note  Patient: [name]  Procedure(s) Performed: CYSTOSCOPY WITH STENT REPLACEMENT (Exchange) (Bilateral Ureter) CYSTOSCOPY WITH RETROGRADE PYELOGRAM (Left Ureter)  Patient Location: PACU  Anesthesia Type:General  Level of Consciousness: awake, alert  and oriented  Airway & Oxygen Therapy: Patient Spontanous Breathing and Patient connected to face mask oxygen  Post-op Assessment: Report given to RN, Post -op Vital signs reviewed and stable and Patient moving all extremities  Post vital signs: Reviewed and stable  Last Vitals:  Vitals Value Taken Time  BP 133/48 11/13/2017 10:33 AM  Temp    Pulse 81 11/13/2017 10:36 AM  Resp 21 11/13/2017 10:36 AM  SpO2 98 % 11/13/2017 10:36 AM  Vitals shown include unvalidated device data.  Last Pain:  Vitals:   11/13/17 0830  TempSrc: Tympanic  PainSc: 0-No pain         Complications: No apparent anesthesia complications

## 2017-11-13 NOTE — Anesthesia Postprocedure Evaluation (Signed)
Anesthesia Post Note  Patient: [name]  Procedure(s) Performed: CYSTOSCOPY WITH STENT REPLACEMENT (Exchange) (Bilateral Ureter) CYSTOSCOPY WITH RETROGRADE PYELOGRAM (Left Ureter)  Patient location during evaluation: PACU Anesthesia Type: General Level of consciousness: awake and alert Pain management: pain level controlled Vital Signs Assessment: post-procedure vital signs reviewed and stable Respiratory status: spontaneous breathing, nonlabored ventilation, respiratory function stable and patient connected to nasal cannula oxygen Cardiovascular status: blood pressure returned to baseline and stable Postop Assessment: no apparent nausea or vomiting Anesthetic complications: no     Last Vitals:  Vitals:   11/13/17 1111 11/13/17 1133  BP:  128/61  Pulse: 71 70  Resp: 10 14  Temp: 36.6 C 36.4 C  SpO2: 99% 97%    Last Pain:  Vitals:   11/13/17 1133  TempSrc:   PainSc: 0-No pain                 Lavalle Skoda S

## 2017-11-13 NOTE — Anesthesia Procedure Notes (Signed)
Procedure Name: LMA Insertion Date/Time: 11/13/2017 10:12 AM Performed by: Lowry Bowl, CRNA Pre-anesthesia Checklist: Patient identified, Emergency Drugs available, Suction available and Patient being monitored Patient Re-evaluated:Patient Re-evaluated prior to induction Oxygen Delivery Method: Circle system utilized Preoxygenation: Pre-oxygenation with 100% oxygen Induction Type: IV induction Ventilation: Mask ventilation without difficulty LMA: LMA inserted LMA Size: 3.0 Number of attempts: 1 Placement Confirmation: positive ETCO2 and breath sounds checked- equal and bilateral Tube secured with: Tape Dental Injury: Teeth and Oropharynx as per pre-operative assessment

## 2017-11-13 NOTE — OR Nursing (Signed)
Dr. Marcello Moores notified of repeat blood sugar. Blood sugar down to 68. Verified insulin pump has been off since 7 am. Patient denies any symptoms of low sugars.

## 2017-11-14 ENCOUNTER — Inpatient Hospital Stay

## 2017-11-14 DIAGNOSIS — C561 Malignant neoplasm of right ovary: Secondary | ICD-10-CM

## 2017-11-14 DIAGNOSIS — C569 Malignant neoplasm of unspecified ovary: Secondary | ICD-10-CM | POA: Diagnosis not present

## 2017-11-14 DIAGNOSIS — C800 Disseminated malignant neoplasm, unspecified: Principal | ICD-10-CM

## 2017-11-14 LAB — CBC WITH DIFFERENTIAL/PLATELET
Basophils Absolute: 0 10*3/uL (ref 0–0.1)
Basophils Relative: 1 %
Eosinophils Absolute: 0 10*3/uL (ref 0–0.7)
Eosinophils Relative: 1 %
HCT: 33.2 % — ABNORMAL LOW (ref 35.0–47.0)
Hemoglobin: 10.9 g/dL — ABNORMAL LOW (ref 12.0–16.0)
Lymphocytes Relative: 29 %
Lymphs Abs: 0.8 10*3/uL — ABNORMAL LOW (ref 1.0–3.6)
MCH: 27 pg (ref 26.0–34.0)
MCHC: 32.8 g/dL (ref 32.0–36.0)
MCV: 82.3 fL (ref 80.0–100.0)
Monocytes Absolute: 0.5 10*3/uL (ref 0.2–0.9)
Monocytes Relative: 17 %
Neutro Abs: 1.4 10*3/uL (ref 1.4–6.5)
Neutrophils Relative %: 52 %
Platelets: 240 10*3/uL (ref 150–440)
RBC: 4.04 MIL/uL (ref 3.80–5.20)
RDW: 22.5 % — ABNORMAL HIGH (ref 11.5–14.5)
WBC: 2.7 10*3/uL — ABNORMAL LOW (ref 3.6–11.0)

## 2017-11-14 LAB — COMPREHENSIVE METABOLIC PANEL
ALT: 15 U/L (ref 0–44)
AST: 23 U/L (ref 15–41)
Albumin: 3.1 g/dL — ABNORMAL LOW (ref 3.5–5.0)
Alkaline Phosphatase: 183 U/L — ABNORMAL HIGH (ref 38–126)
Anion gap: 10 (ref 5–15)
BUN: 18 mg/dL (ref 8–23)
CO2: 26 mmol/L (ref 22–32)
Calcium: 9.3 mg/dL (ref 8.9–10.3)
Chloride: 100 mmol/L (ref 98–111)
Creatinine, Ser: 0.8 mg/dL (ref 0.44–1.00)
GFR calc Af Amer: >60 mL/min (ref >60)
GFR calc non Af Amer: >60 mL/min (ref >60)
Glucose, Bld: 172 mg/dL — ABNORMAL HIGH (ref 70–99)
Potassium: 3.9 mmol/L (ref 3.5–5.1)
Sodium: 136 mmol/L (ref 135–145)
Total Bilirubin: 0.6 mg/dL (ref 0.3–1.2)
Total Protein: 7 g/dL (ref 6.5–8.1)

## 2017-11-14 LAB — MAGNESIUM: Magnesium: 1.7 mg/dL (ref 1.7–2.4)

## 2017-11-14 MED ORDER — HEPARIN SOD (PORK) LOCK FLUSH 100 UNIT/ML IV SOLN
500.0000 [IU] | Freq: Once | INTRAVENOUS | Status: AC
  Administered 2017-11-14: 500 [IU] via INTRAVENOUS
  Filled 2017-11-14: qty 5

## 2017-11-14 MED ORDER — SODIUM CHLORIDE 0.9% FLUSH
10.0000 mL | INTRAVENOUS | Status: DC | PRN
  Administered 2017-11-14: 10 mL via INTRAVENOUS
  Filled 2017-11-14: qty 10

## 2017-11-14 NOTE — Progress Notes (Signed)
Magnesium 1.7, per [place] Mam, pt stable to discharge, no Mg needed today.

## 2017-11-17 ENCOUNTER — Inpatient Hospital Stay

## 2017-11-17 DIAGNOSIS — C561 Malignant neoplasm of right ovary: Secondary | ICD-10-CM

## 2017-11-17 DIAGNOSIS — C800 Disseminated malignant neoplasm, unspecified: Principal | ICD-10-CM

## 2017-11-17 DIAGNOSIS — C569 Malignant neoplasm of unspecified ovary: Secondary | ICD-10-CM | POA: Diagnosis not present

## 2017-11-17 LAB — MAGNESIUM: Magnesium: 1.6 mg/dL — ABNORMAL LOW (ref 1.7–2.4)

## 2017-11-17 MED ORDER — HEPARIN SOD (PORK) LOCK FLUSH 100 UNIT/ML IV SOLN
500.0000 [IU] | Freq: Once | INTRAVENOUS | Status: AC
  Administered 2017-11-17: 500 [IU] via INTRAVENOUS

## 2017-11-17 MED ORDER — SODIUM CHLORIDE 0.9% FLUSH
10.0000 mL | INTRAVENOUS | Status: DC | PRN
  Administered 2017-11-17: 10 mL via INTRAVENOUS
  Filled 2017-11-17: qty 10

## 2017-11-17 NOTE — Progress Notes (Signed)
[name] did not want to get mag today, will wait and see what her level is on Monday. [name]/Dr. Mike Gip OK with patient's decision.

## 2017-11-20 ENCOUNTER — Other Ambulatory Visit: Payer: Self-pay | Admitting: Urgent Care

## 2017-11-20 ENCOUNTER — Inpatient Hospital Stay

## 2017-11-20 ENCOUNTER — Ambulatory Visit: Payer: Medicare Other

## 2017-11-20 ENCOUNTER — Other Ambulatory Visit: Payer: Medicare Other

## 2017-11-20 DIAGNOSIS — C561 Malignant neoplasm of right ovary: Secondary | ICD-10-CM

## 2017-11-20 DIAGNOSIS — Z5111 Encounter for antineoplastic chemotherapy: Secondary | ICD-10-CM

## 2017-11-20 DIAGNOSIS — C569 Malignant neoplasm of unspecified ovary: Secondary | ICD-10-CM | POA: Diagnosis not present

## 2017-11-20 DIAGNOSIS — C800 Disseminated malignant neoplasm, unspecified: Principal | ICD-10-CM

## 2017-11-20 LAB — MAGNESIUM: Magnesium: 1.6 mg/dL — ABNORMAL LOW (ref 1.7–2.4)

## 2017-11-20 MED ORDER — HEPARIN SOD (PORK) LOCK FLUSH 100 UNIT/ML IV SOLN
500.0000 [IU] | Freq: Once | INTRAVENOUS | Status: AC
  Administered 2017-11-20: 500 [IU] via INTRAVENOUS
  Filled 2017-11-20: qty 5

## 2017-11-20 NOTE — Progress Notes (Signed)
Ms. Neitzke did not want to get mag today, will wait and see what her level is on Friday. [name]/MD OK with patient's decision.

## 2017-11-23 ENCOUNTER — Ambulatory Visit: Payer: Medicare Other | Admitting: Hematology and Oncology

## 2017-11-23 ENCOUNTER — Other Ambulatory Visit: Payer: Medicare Other

## 2017-11-23 ENCOUNTER — Ambulatory Visit: Payer: Medicare Other

## 2017-11-23 ENCOUNTER — Other Ambulatory Visit: Payer: Self-pay | Admitting: *Deleted

## 2017-11-23 DIAGNOSIS — C569 Malignant neoplasm of unspecified ovary: Secondary | ICD-10-CM

## 2017-11-24 ENCOUNTER — Telehealth: Payer: Self-pay

## 2017-11-24 ENCOUNTER — Inpatient Hospital Stay (HOSPITAL_BASED_OUTPATIENT_CLINIC_OR_DEPARTMENT_OTHER): Admitting: Hematology and Oncology

## 2017-11-24 ENCOUNTER — Other Ambulatory Visit: Payer: Self-pay

## 2017-11-24 ENCOUNTER — Inpatient Hospital Stay

## 2017-11-24 VITALS — BP 139/70 | HR 93 | Temp 95.2°F | Resp 18 | Wt 115.8 lb

## 2017-11-24 DIAGNOSIS — Z90722 Acquired absence of ovaries, bilateral: Secondary | ICD-10-CM

## 2017-11-24 DIAGNOSIS — C801 Malignant (primary) neoplasm, unspecified: Secondary | ICD-10-CM

## 2017-11-24 DIAGNOSIS — D709 Neutropenia, unspecified: Secondary | ICD-10-CM

## 2017-11-24 DIAGNOSIS — C786 Secondary malignant neoplasm of retroperitoneum and peritoneum: Secondary | ICD-10-CM

## 2017-11-24 DIAGNOSIS — Z7189 Other specified counseling: Secondary | ICD-10-CM

## 2017-11-24 DIAGNOSIS — R112 Nausea with vomiting, unspecified: Secondary | ICD-10-CM

## 2017-11-24 DIAGNOSIS — I10 Essential (primary) hypertension: Secondary | ICD-10-CM

## 2017-11-24 DIAGNOSIS — M129 Arthropathy, unspecified: Secondary | ICD-10-CM

## 2017-11-24 DIAGNOSIS — R6881 Early satiety: Secondary | ICD-10-CM

## 2017-11-24 DIAGNOSIS — C569 Malignant neoplasm of unspecified ovary: Secondary | ICD-10-CM | POA: Diagnosis not present

## 2017-11-24 DIAGNOSIS — R1310 Dysphagia, unspecified: Secondary | ICD-10-CM

## 2017-11-24 DIAGNOSIS — I252 Old myocardial infarction: Secondary | ICD-10-CM

## 2017-11-24 DIAGNOSIS — N1330 Unspecified hydronephrosis: Secondary | ICD-10-CM

## 2017-11-24 DIAGNOSIS — K219 Gastro-esophageal reflux disease without esophagitis: Secondary | ICD-10-CM

## 2017-11-24 DIAGNOSIS — E109 Type 1 diabetes mellitus without complications: Secondary | ICD-10-CM

## 2017-11-24 DIAGNOSIS — D649 Anemia, unspecified: Secondary | ICD-10-CM

## 2017-11-24 DIAGNOSIS — I509 Heart failure, unspecified: Secondary | ICD-10-CM

## 2017-11-24 DIAGNOSIS — Z9641 Presence of insulin pump (external) (internal): Secondary | ICD-10-CM

## 2017-11-24 DIAGNOSIS — R634 Abnormal weight loss: Secondary | ICD-10-CM

## 2017-11-24 DIAGNOSIS — Z87891 Personal history of nicotine dependence: Secondary | ICD-10-CM

## 2017-11-24 DIAGNOSIS — C782 Secondary malignant neoplasm of pleura: Secondary | ICD-10-CM | POA: Diagnosis not present

## 2017-11-24 DIAGNOSIS — Z79899 Other long term (current) drug therapy: Secondary | ICD-10-CM

## 2017-11-24 DIAGNOSIS — Z90710 Acquired absence of both cervix and uterus: Secondary | ICD-10-CM

## 2017-11-24 DIAGNOSIS — R590 Localized enlarged lymph nodes: Secondary | ICD-10-CM

## 2017-11-24 DIAGNOSIS — G629 Polyneuropathy, unspecified: Secondary | ICD-10-CM

## 2017-11-24 DIAGNOSIS — I2510 Atherosclerotic heart disease of native coronary artery without angina pectoris: Secondary | ICD-10-CM

## 2017-11-24 DIAGNOSIS — E785 Hyperlipidemia, unspecified: Secondary | ICD-10-CM

## 2017-11-24 DIAGNOSIS — Z794 Long term (current) use of insulin: Secondary | ICD-10-CM

## 2017-11-24 DIAGNOSIS — J90 Pleural effusion, not elsewhere classified: Secondary | ICD-10-CM

## 2017-11-24 DIAGNOSIS — Z801 Family history of malignant neoplasm of trachea, bronchus and lung: Secondary | ICD-10-CM

## 2017-11-24 DIAGNOSIS — R748 Abnormal levels of other serum enzymes: Secondary | ICD-10-CM

## 2017-11-24 DIAGNOSIS — R1319 Other dysphagia: Secondary | ICD-10-CM

## 2017-11-24 DIAGNOSIS — M069 Rheumatoid arthritis, unspecified: Secondary | ICD-10-CM

## 2017-11-24 LAB — CBC WITH DIFFERENTIAL/PLATELET
Basophils Absolute: 0 10*3/uL (ref 0–0.1)
Basophils Relative: 1 %
Eosinophils Absolute: 0 10*3/uL (ref 0–0.7)
Eosinophils Relative: 1 %
HCT: 34.6 % — ABNORMAL LOW (ref 35.0–47.0)
Hemoglobin: 11.4 g/dL — ABNORMAL LOW (ref 12.0–16.0)
Lymphocytes Relative: 21 %
Lymphs Abs: 0.6 10*3/uL — ABNORMAL LOW (ref 1.0–3.6)
MCH: 27.3 pg (ref 26.0–34.0)
MCHC: 32.9 g/dL (ref 32.0–36.0)
MCV: 82.9 fL (ref 80.0–100.0)
Monocytes Absolute: 0.5 10*3/uL (ref 0.2–0.9)
Monocytes Relative: 18 %
Neutro Abs: 1.6 10*3/uL (ref 1.4–6.5)
Neutrophils Relative %: 59 %
Platelets: 250 10*3/uL (ref 150–440)
RBC: 4.18 MIL/uL (ref 3.80–5.20)
RDW: 23 % — ABNORMAL HIGH (ref 11.5–14.5)
WBC: 2.7 10*3/uL — ABNORMAL LOW (ref 3.6–11.0)

## 2017-11-24 LAB — COMPREHENSIVE METABOLIC PANEL
ALT: 18 U/L (ref 0–44)
AST: 24 U/L (ref 15–41)
Albumin: 3.1 g/dL — ABNORMAL LOW (ref 3.5–5.0)
Alkaline Phosphatase: 162 U/L — ABNORMAL HIGH (ref 38–126)
Anion gap: 12 (ref 5–15)
BUN: 18 mg/dL (ref 8–23)
CO2: 26 mmol/L (ref 22–32)
Calcium: 9.3 mg/dL (ref 8.9–10.3)
Chloride: 98 mmol/L (ref 98–111)
Creatinine, Ser: 0.82 mg/dL (ref 0.44–1.00)
GFR calc Af Amer: >60 mL/min (ref >60)
GFR calc non Af Amer: >60 mL/min (ref >60)
Glucose, Bld: 191 mg/dL — ABNORMAL HIGH (ref 70–99)
Potassium: 4.3 mmol/L (ref 3.5–5.1)
Sodium: 136 mmol/L (ref 135–145)
Total Bilirubin: 0.6 mg/dL (ref 0.3–1.2)
Total Protein: 6.9 g/dL (ref 6.5–8.1)

## 2017-11-24 LAB — MAGNESIUM: Magnesium: 1.7 mg/dL (ref 1.7–2.4)

## 2017-11-24 MED ORDER — HEPARIN SOD (PORK) LOCK FLUSH 100 UNIT/ML IV SOLN
500.0000 [IU] | Freq: Once | INTRAVENOUS | Status: AC
  Administered 2017-11-24: 500 [IU] via INTRAVENOUS
  Filled 2017-11-24: qty 5

## 2017-11-24 MED ORDER — SODIUM CHLORIDE 0.9% FLUSH
10.0000 mL | Freq: Once | INTRAVENOUS | Status: AC
  Administered 2017-11-24: 10 mL via INTRAVENOUS
  Filled 2017-11-24: qty 10

## 2017-11-24 NOTE — Telephone Encounter (Signed)
Left vm for pt to return my call to schedule EGD per Dr. Kem Parkinson request.

## 2017-11-24 NOTE — Progress Notes (Signed)
Pt here for follow up. Losing weight noted in chart. Pt stated she has nausea and vomiting -NOT daily but has no appetite and is vomiting intermittently.   Concerned she may need " to get esophagus stretched again " she stated.   Marland Kitchen

## 2017-11-24 NOTE — Progress Notes (Signed)
[facility] day:  11/24/17  Chief Complaint: [name] is a 72 y.o. female with stage IV ovarian cancer who is seen for 1 month assessment.  HPI:  The patient was last seen in the medical oncology clinic on 10/27/2017.  At that time, she felt "the same".  Weight fluctuated.  Exam revealed increasing abdominal nodularity.  LFTs were elevated.  Magnesium was 1.4.  RUQ ultrasound revealed no ductal dilatation.  The largest liver lesion was 5.2 cm.  She has continued to receive IV magnesium supplementation (last on 11/10/2017).  He magnesium level has subsequently been 1.6 - 1.7. She is taking oral magnesium glycinate 200 mg twice a day.   During the interim, patient is "not doing too good". Patient is vomiting her food back up, however denies precipitating nausea. She is having difficulties with solids and fluids. Of note, patient has had esophageal dilatation procedures x 3 (last 08/11/2014) in the past. She notes early satiety as well.  Her weight today is 115 lb 12.8 oz (52.5 kg), which represents a 7 pound decrease.    Past Medical History:  Diagnosis Date  . Anemia   . Arthritis   . CAD (coronary artery disease)    a. 09/2012 Cath: LM nl, LAD 95p, 35m LCX 991mOM2 50, RCA 100.  . Marland KitchenHF (congestive heart failure) ([facility]  . Cholelithiasis   . Collagen vascular disease (HCC)    Rheumatoid Arthritis.  . Depression   . Environmental and seasonal allergies   . Esophageal stricture   . Exertional shortness of breath   . GERD (gastroesophageal reflux disease)   . H/O hiatal hernia   . Herniated disc   . History of pancytopenia   . Hyperlipidemia   . Hypertension   . Hypokalemia   . Leukopenia 2012   s/p bone marrow biopsy, Dr. PaMa Hillock. Neuropathy   . 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 NSTEMI (non-ST elevated myocardial infarction) ([facility]/2014   "mild" (10/18/2012)  . Ovarian cancer ([facility] 2016   chemo and hysterectomy  . Pneumonia 2013; 08/2012   "one lung; double" (10/18/2012)  . Rheumatoid arthritis(714.0)   . [facility]   no residual deficiets  . Type I diabetes mellitus (HCL[place]   "dx'd in 1957" (10/18/2012)  . Ureter obstruction     Past Surgical History:  Procedure Laterality Date  . ABDOMINAL HYSTERECTOMY  06/15/2015   Dx L/S, EXLAP TAH BSO omentectomy RSRx colostomy diaphragm resection stripping  . CARDIAC CATHETERIZATION  10/18/2012   "first one was today" (10/18/2012)  . CATARACT EXTRACTION W/ INTRAOCULAR LENS IMPLANT Right 2010  . CHOLECYSTECTOMY  06/15/2015   combined case with ovarian cancer debulking  . COLON SURGERY  2017   colectomy with colostomy  . CORONARY ARTERY BYPASS GRAFT N/A 10/19/2012   Procedure: CORONARY ARTERY BYPASS GRAFTING (CABG);  Surgeon: StMelrose NakayamaMD;  [place]: [facility] Service: Open Heart Surgery;  Laterality: N/A;  . CYSTOSCOPY W/ RETROGRADES Bilateral 07/13/2017   Procedure: CYSTOSCOPY WITH RETROGRADE PYELOGRAM;  Surgeon: BrHollice EspyMD;  Location: [facility];  Service: Urology;  Laterality: Bilateral;  . CYSTOSCOPY W/ RETROGRADES Left 11/13/2017   Procedure: CYSTOSCOPY WITH RETROGRADE PYELOGRAM;  Surgeon: BrHollice EspyMD;  Location: [facility] OR[facility];  Service: Urology;  Laterality: Left;  . CYSTOSCOPY W/ URETERAL STENT PLACEMENT Bilateral 11/13/2017   Procedure: CYSTOSCOPY WITH STENT REPLACEMENT (Exchange);  Surgeon: Hollice Espy, MD;  Location: [facility] OR[facility];  Service: Urology;  Laterality: Bilateral;  . CYSTOSCOPY WITH STENT PLACEMENT Bilateral 07/13/2017   Procedure: CYSTOSCOPY WITH STENT PLACEMENT;  Surgeon: Hollice Espy, MD;  Location: [facility] OR[facility];  Service: Urology;  Laterality: Bilateral;  . ESOPHAGEAL DILATION     "3 or 4 times" (10/19/2011)  . ESOPHAGOGASTRODUODENOSCOPY  2012   Dr. Minna Merritts  . EYE SURGERY     bilateral cataract  . OSTOMY    . OVARY SURGERY     removal  . PERIPHERAL VASCULAR CATHETERIZATION N/A  03/02/2015   Procedure: Glori Luis Cath Insertion;  Surgeon: Algernon Huxley, MD;  Location: [facility];  Service: Cardiovascular;  Laterality: N/A;  . TUBAL LIGATION  1970    Family History  Problem Relation Age of Onset  . Diabetes Mother   . Arthritis Mother   . Diabetes Father   . Arthritis Father   . Aneurysm Sister        neck  . Cancer Sister        lung    Social History:  reports that she quit smoking about 23 years ago. Her smoking use included cigarettes. She has a 30.00 pack-year smoking history. She has never used smokeless tobacco. She reports that she does not drink alcohol or use drugs.  Patient is a retired Art therapist. Patient denies exposure to radiation and toxins. She spends every Thursday afternoon with her sister.  Her son is [name] and daughter is [name].  She is accompanied by her husband, [name], today.  Allergies:  Allergies  Allergen Reactions  . Adhesive [Tape]     Paper tape and tegaderm OK  . Codeine Nausea And Vomiting    Current Medications: Current Outpatient Medications  Medication Sig Dispense Refill  . Calcium-Vitamin D 600-200 MG-UNIT tablet Take 1 tablet by mouth daily.     . cholecalciferol (VITAMIN D) 1000 units tablet Take 1,000 Units by mouth daily.    . DULoxetine (CYMBALTA) 20 MG capsule Take 1 capsule (20 mg total) by mouth daily. 90 capsule 2  . enoxaparin (LOVENOX) 60 MG/0.6ML injection INJECT 0.6ML (60MG TOTAL) UNDER THE SKINEVERY 12 HOURS 36 mL 1  . ferrous sulfate 325 (65 FE) MG EC tablet Take 325 mg by mouth daily with breakfast.    . folic acid (FOLVITE) 1 MG tablet TAKE 1 TABLET BY MOUTH ONCE DAILY 90 tablet 3  . insulin lispro (HUMALOG) 100 UNIT/ML injection Inject 0.06 mLs (6 Units total) into the skin daily. (Patient taking differently: Inject 0-85 Units into the skin daily. Via insulin pump) 30 mL 3  . lidocaine-prilocaine (EMLA) cream APPLY 1 APPLICATION TOPICALLY AS NEEDED 1 HOUR PRIOR TO TREATMENT. COVER WITH PRESS N  SEAL UNTIL TREATMENT TIME AS DIRECTED 30 g 1  . loratadine (CLARITIN) 10 MG tablet Take 1 tablet by mouth daily as needed for allergies.     . magnesium oxide (MAG-OX) 400 (241.3 Mg) MG tablet TAKE 1 TABLET BY MOUTH ONCE DAILY (Patient taking differently: TAKE 2 TABLET BY MOUTH ONCE DAILY) 30 tablet 1  . ondansetron (ZOFRAN) 8 MG tablet TAKE 1 TABLET BY MOUTH EVERY 8 HOURS AS NEEDED FOR NAUSEA AND VOMITING 30 tablet 1  . pantoprazole (PROTONIX) 40 MG tablet TAKE 1 TABLET BY MOUTH TWICE (2) DAILY 180 tablet 1  . potassium chloride SA (K-DUR,KLOR-CON) 20 MEQ tablet TAKE 1 TABLET BY MOUTH ONCE A DAY 90 tablet 1  . predniSONE (DELTASONE) 5 MG tablet Take 5 mg  by mouth 2 (two) times daily with a meal.     . vitamin C (ASCORBIC ACID) 500 MG tablet Take 500 mg by mouth daily.    Marland Kitchen zinc gluconate 50 MG tablet Take 50 mg daily by mouth.     No current facility-administered medications for this visit.    Facility-Administered Medications Ordered in Other Visits  Medication Dose Route Frequency Provider Last Rate Last Dose  . 0.9 %  sodium chloride infusion   Intravenous Continuous Lequita Asal, MD   Stopped at 04/24/17 1140  . heparin lock flush 100 unit/mL  500 Units Intravenous Once Millan Legan C, MD      . heparin lock flush 100 unit/mL  500 Units Intravenous Once Macio Kissoon C, MD      . magnesium sulfate IVPB 4 g 100 mL  4 g Intravenous Once Faythe Casa E, NP      . sodium chloride 0.9 % injection 10 mL  10 mL Intracatheter PRN Osa Campoli C, MD      . sodium chloride 0.9 % injection 10 mL  10 mL Intravenous PRN Lequita Asal, MD   10 mL at 04/13/15 0852  . sodium chloride flush (NS) 0.9 % injection 10 mL  10 mL Intravenous PRN Joli Koob C, MD      . sodium chloride flush (NS) 0.9 % injection 10 mL  10 mL Intravenous PRN Lequita Asal, MD   10 mL at 10/23/17 0840    Review of Systems  Constitutional: Positive for malaise/fatigue and weight loss  (down 7 pounds). Negative for chills, diaphoresis and fever.       "Doesn't feel good".  HENT: Negative.  Negative for congestion, hearing loss, nosebleeds, sinus pain, sore throat and tinnitus.   Eyes: Negative.  Negative for blurred vision, double vision, photophobia, pain and discharge.  Respiratory: Negative.  Negative for cough, hemoptysis, sputum production and shortness of breath.   Cardiovascular: Negative.  Negative for chest pain, palpitations, orthopnea, leg swelling and PND.  Gastrointestinal: Positive for nausea and vomiting. Negative for abdominal pain, blood in stool, constipation, diarrhea and melena.       Early satiety.  Vomiting 2-3 x/week.  Genitourinary: Negative.  Negative for dysuria, flank pain, frequency, hematuria and urgency.  Musculoskeletal: Positive for joint pain (Severe rheumatoid arthritis). Negative for back pain, falls, myalgias and neck pain.  Skin: Negative.  Negative for itching and rash.  Neurological: Positive for tingling (Chronic neuropathy in hands and feet). Negative for dizziness, tremors, sensory change, focal weakness, seizures and headaches.  Endo/Heme/Allergies: Does not bruise/bleed easily.       Diabetes; on insulin pump  Psychiatric/Behavioral: Negative for depression, memory loss and suicidal ideas. The patient is not nervous/anxious and does not have insomnia.   All other systems reviewed and are negative.  Performance status (ECOG): 2 - Symptomatic, <50% confined to bed   Physical Exam:  Blood pressure 139/70, pulse 93, temperature (!) 95.2 F (35.1 C), temperature source Tympanic, resp. rate 18, weight 115 lb 12.8 oz (52.5 kg). GENERAL:  Well developed, well nourished, woman sitting comfortably in the exam room in no acute distress. MENTAL STATUS:  Alert and oriented to person, place and time. HEAD:  Wearing a white cap.  Normocephalic, atraumatic, face symmetric, no Cushingoid features. EYES:  Glasses.  Blue eyes.  Pupils equal round  and reactive to light and accomodation.  No conjunctivitis or scleral icterus. ENT:  Oropharynx clear without lesion.  Tongue normal. Mucous membranes  moist.  RESPIRATORY:  Dry fine crackles at the bases.  No rales, wheezes or rhonchi. CARDIOVASCULAR:  Regular rate and rhythm without murmur, rub or gallop. ABDOMEN:  Firm epigastric mass.  Palpable nodularity across abdomen.  Non-tender, with active bowel sounds, and no appreciable hepatosplenomegaly. Ostomy. SKIN:  Bruising across abdomen at sites of Lovenox injections.  No rashes, ulcers or lesions. EXTREMITIES: No edema, no skin discoloration or tenderness.  No palpable cords. LYMPH NODES: No palpable cervical, supraclavicular, axillary or inguinal adenopathy  NEUROLOGICAL: Unremarkable. PSYCH:  Appropriate.    Radiology studies: 02/13/2015:  Abdomen and pelvic CT revealed bilateral mass-like adnexal regions (right adnexal mass 5.6 x 5.0 cm and the left adnexa mass 10.3 x 6.0 x 9.9 cm).  There was a large amount of soft tissue throughout the peritoneal cavity involving the omentum and other peritoneal surfaces.  There was a small volume ascites. There was a peripheral 3.3 x 1.9 cm low-attenuation lesion overlying the right lobe of the liver, likely representing a serosal implant. There was 1.4 x 2.2 cm ill-defined peripheral lesion within the inferior aspect of segment 6 adjacent to the ampulla in the duodenum.  04/28/2015:  Abdominal and pelvic CT revealed decreasing bilateral ovarian masses.   The left adnexal mass measured 4.0 x 6.1 cm (previously 5.0 x 8.5 cm). The right ovary measured 3.8 x 4.9 cm (previously 4.7 x 5.6 cm).  There was improved peritoneal carcinomatosis.  There was a small amount of ascites.  The hepatic dome lesion was stable. The previously seen right hepatic lobe lesion was not well-visualized.  There was a small left pleural effusion and trace right pleural effusion.  The nodular lesion at the ampulla of Vater, extending into  the duodenum was stable.  There was no biliary ductal dilatation. 08/01/2015:  Rght upper extremity ultrasound revealed a near occlusive thrombus within the central portion of the right internal jugular vein and central portion of the right subclavian vein. 09/01/2015:  Chest, abdomen, and pelvic CT revealed continued decrease in perihepatic fluid collection contiguous with the right pleural space with percutaneous drain.  09/11/2015:  Chest, abdomen, and pelvic CT revealed a residual versus recurrent fluid collection posterior to the right hepatic lobe (2.6 x 1.1 x 4.0 cm).   10/13/2015:  Chest, abdomen, and pelvic CT revealed resolution of the empyema. 02/15/2016:  Chest, abdomen, and pelvic CT revealed multiple soft tissue nodules throughout the pelvis, small bowel mesenteric and peritoneum and, concerning for peritoneal metastatic disease.  There was soft tissue irregularity within the left upper quadrant.  There was mild right hydronephrosis (etiology unclear).  There was interval resolution of previously described right pleural based fluid and gas collection. There was a pleural based nodule within the right lower hemi-thorax concerning for pleural based metastasis.  There were small bilateral pleural effusions.  There was pulmonary nodularity, predominately within the left upper lobe (metastatic or infectious/inflammatory etiology).  There was slightly increased mediastinal adenopathy (infectious/inflammatory or metastatic).  There was a low attenuation lesion within the left hepatic lobe (complicated fluid within the fissure or metastatic disease). 03/08/2016:  Abdomen and pelvic CT revealed mild progression of peritoneal disease in the abdomen.  There was interval increase in loculated fluid around the lateral segment left liver, stomach, and spleen.  There was persistent soft tissue lesion at the level of the ampulla with mild intra and extrahepatic biliary duct dilatation.  There was persistent  intrahepatic and capsular metastatic disease involving the liver. 05/26/2016:  Head MRI revealed multiple small  areas of acute infarct involving the occipital parietal lobe bilaterally and the left lateral cerebellum,  consistent with posterior circulation emboli.  06/10/2016:  Adomen and pelvic CT revealed progression of peritoneal carcinomatosis predominantly in the perihepatic space, bilateral lower quadrants and pelvis.  There was worsening bilateral obstructive uropathy due to malignant involvement of the pelvic ureters.  There was stable small pleural/subpleural nodules at the right lung base.  There was stable small left pleural effusion.  There was decreased small volume perihepatic ascites.  08/09/2016:  Abdomen and pelvic CT revealed interval increase and loculated fluid collections within the peritoneal space consistent with progression of peritoneal ovarian carcinoma metastasis.  There was one large 7 cm collection in the RIGHT lower quadrant which had increased significantly in size and may represent of point of small bowel obstruction.  The proximal stomach and duodenum were decompressed. The mid small bowel was mildly dilated. There was concern for obstruction given the large intraperitoneal fluid collection.  There was increase in subcapsular hepatic metastatic fluid collections.  There was enlargement of rounded ampullary lesion in the second portion duodenum concerning for metastatic lesion.  There was mild biliary duct dilatation (stable).  There was nodular pleural metastasis in the RIGHT lower lobe pleural space. 11/03/2016:  Abdomen and pelvic CT revealed multifocal cystic metastases in the abdomen/pelvis, reflecting peritoneal disease.  The dominant lesion in the left anterior abdomen has progressed, while additional lesions were mixed.  Peritoneal disease along the liver and spleen progressed.  Pleural-based metastases along the posterior right hemithorax progressed. There were trace left  pleural effusion.  There was a stable 1.7 cm duodenal lesion at the ampulla. 11/29/2016:  Abdomen and pelvic CT revealed mild interval enlargement of pleural metastasis the RIGHT lower lobe.  There was overall mild decrease in cystic peritoneal metastasis within the abdomen pelvis. The solid lesion at the terminal ileum was decreased in size. Subcapsular lesion in the liver was slightly decreased in size.  There was no evidence of disease progression the abdomen pelvis.  There was mild intrahepatic duct dilatation similar prior.  The ampullary lesion was slightly larger.  There was mild hydronephrosis and hydroureter on the LEFT likely related to cystic metastasis at the LEFT vesicoureteral junction 12/09/2016:  MRCP revealed the cystic lesion within the pancreatic head/uncinate process had decreased in size and demonstrated no suspicious characteristics. This can be presumed a pseudocyst or focus of side branch duct ectasia.  There was similar to slight progression of extensive peritoneal metastasis since 11/29/2016. Grossly similar abdominal nodal and right pleural metastasis.  There was chronic mild common duct dilatation with similar soft tissue fullness about the ampulla compared to 11/29/2016.  There was improved to resolved left-sided hydronephrosis. 01/20/2017:  Abdomen and pelvic CT revealed complex mixed interval changes in widespread metastatic disease in the peritoneal cavity, lower chest and thoracic, abdominal and pelvic lymph nodes. Right lower chest pleural metastasis and several of the peritoneal tumor implants had increased.  There was new right inguinal lymphadenopathy.  Some of the peritoneal tumor implants had decreased.  The small dependent left pleural effusion was slightly increased .  The mild left hydroureteronephrosis was improved.  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   The chronic biliary ductal dilatation was stable .  There was no evidence of bowel obstruction. 05/22/2017:   Chest, abdomen, and pelvic CT revealed  significant enlargement of metastatic disease involving the right pleura, low paratracheal nodal chain, omentum, peritoneal surfaces, mesentery, and liver. Tumors extending through the ostomy site  into the subcutaneous tissues. There were multiple new metastatic liver and right pleural lesions.  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  There was new sclerotic lesion in the T11 vertebral body, concerning for possible metastatic disease. There was healing anterior nondisplaced rib fractures of the right second through ninth ribs that appeared new. 07/10/2017:  Chest, abdomen and pelvic CT revealed RIGHT lower lobe pleural-based nodules, slightly smaller.  There was stable bilateral pleural effusions (left > right) and stable mediastinal adenopathy.  There was evidence of progression of disease in the abdomen pelvis.  There was interval increase in size of hepatic lesions most noticeably in the LEFT hepatic lobe.  There was interval increase in bilateral hydronephrosis and hydroureter.  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  There was interval increase in size of large peritoneal and pelvic peritoneal implants.  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 10/24/2017:  RUQ ultrasound revealed multiple liver metastasis, the largest of 5.2 cm. There was no hydronephrosis.  There was a right pleural effusion.  Admissions: [facility] from 06/15/2015 - 06/22/2015.  She underwent exploratory laparotomy, lysis of adhesions, total abdominal hysterectomy with bilateral salpingo-oophorectomy, infracolic omentectomy, optimal tumor debulking(< 1 cm), recto-sigmoid resection with creation of end colostomy, cholecystectomy, mobilization of splenic flexure and liver with  diaphragmatic stripping on 06/15/2015. The right diaphragm was cleared of tumor. During dissection, the diaphragm was entered and closed with sutures.  [facility] from 07/01/2015 - 07/03/2015 with pleural effusion. [facility] from 07/07/2015 - 07/09/2015 with recurrent pleural effusion. [facility] from 07/29/2015 - 08/10/2015 with a right-sided empyema and liver abscess. She underwent CT-guided placement of a liver abscess drain on 07/30/2015.  Liver abscess culture grew out group B strep and Enterobacter which was sensitive to Zosyn. She was transitioned to ertapenem Colbert Ewing) prior to discharge.  She was readmitted to [facility] on 09/17/2015. 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  Head MRI on 05/26/2016 revealed multiple small areas of acute infarct involving the occipital parietal lobe bilaterally and the left lateral cerebellum,  consistent with posterior circulation emboli.  Work-up included a negative carotid ultrasound and echocardiogram. [facility] from 08/10/2016 - 08/14/2016 with a small bowel obstruction.  She was managed conservatively. [place] [facility] from 09/27/2016 - 09/28/2016 with symptomatic anemia and diarrhea.   Infusion on 11/24/2017  Component Date Value Ref Range Status  . Magnesium 11/24/2017 1.7  1.7 - 2.4 mg/dL Final   Performed at [facility], [address] [place], [place] [address]  . Sodium 11/24/2017 136  135 - 145 mmol/L Final  . Potassium 11/24/2017 4.3  3.5 - 5.1 mmol/L Final  . Chloride 11/24/2017 98  98 - 111 mmol/L Final  . CO2 11/24/2017 26  22 - 32 mmol/L Final  . Glucose, Bld 11/24/2017 191* 70 - 99 mg/dL Final  . BUN 11/24/2017 18  8 - 23 mg/dL Final  . Creatinine, Ser 11/24/2017 0.82  0.44 - 1.00 mg/dL Final  . Calcium 11/24/2017 9.3  8.9 - 10.3 mg/dL Final  . Total Protein 11/24/2017 6.9  6.5 - 8.1 g/dL Final  . Albumin 11/24/2017 3.1* 3.5 - 5.0 g/dL Final  . AST 11/24/2017 24  15 - 41 U/L Final  . ALT 11/24/2017 18  0 - 44 U/L  Final  . Alkaline Phosphatase 11/24/2017 162* 38 -  126 U/L Final  . Total Bilirubin 11/24/2017 0.6  0.3 - 1.2 mg/dL Final  . GFR calc non Af Amer 11/24/2017 >60  >60 mL/min Final  . GFR calc Af Amer 11/24/2017 >60  >60 mL/min Final   Comment: (NOTE) The eGFR has been calculated using the CKD EPI equation. This calculation has not been validated in all clinical situations. eGFR's persistently <60 mL/min signify possible Chronic Kidney Disease.   Georgiann Hahn gap 11/24/2017 12  5 - 15 Final   Performed at [facility], [address] [place], [place] [address]  . WBC 11/24/2017 2.7* 3.6 - 11.0 K/uL Final  . RBC 11/24/2017 4.18  3.80 - 5.20 MIL/uL Final  . Hemoglobin 11/24/2017 11.4* 12.0 - 16.0 g/dL Final  . HCT 11/24/2017 34.6* 35.0 - 47.0 % Final  . MCV 11/24/2017 82.9  80.0 - 100.0 fL Final  . MCH 11/24/2017 27.3  26.0 - 34.0 pg Final  . MCHC 11/24/2017 32.9  32.0 - 36.0 g/dL Final  . RDW 11/24/2017 23.0* 11.5 - 14.5 % Final  . Platelets 11/24/2017 250  150 - 440 K/uL Final  . Neutrophils Relative % 11/24/2017 59  % Final  . Neutro Abs 11/24/2017 1.6  1.4 - 6.5 K/uL Final  . Lymphocytes Relative 11/24/2017 21  % Final  . Lymphs Abs 11/24/2017 0.6* 1.0 - 3.6 K/uL Final  . Monocytes Relative 11/24/2017 18  % Final  . Monocytes Absolute 11/24/2017 0.5  0.2 - 0.9 K/uL Final  . Eosinophils Relative 11/24/2017 1  % Final  . Eosinophils Absolute 11/24/2017 0.0  0 - 0.7 K/uL Final  . Basophils Relative 11/24/2017 1  % Final  . Basophils Absolute 11/24/2017 0.0  0 - 0.1 K/uL Final   Performed at [facility], [address] [place], [place] [address]    Assessment:  TYESHA JOFFE is a 72 y.o. female with progressive stage IV ovarian cancer.  She presented with abdominal discomfort and bloating.  Omental biopsy on 02/23/2015 revealed metastatic high grade serous carcinoma, consistent with gynecologic origin.   She was initially diagnosed with clinical stage IIIC ([id number]).   CA125 was 707 on 02/17/2015.  She received 4 cycles of neoadjuvant carboplatin and Taxol (03/05/2015 - 05/22/2015).  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  She had loose stools on oral magnesium.  She was initially on Neurontin then switched Lyrica with cycle #3.  Cycle #4 was notable for neutropenia (ANC 300) requiring GCSF x 3 days.    She underwent exploratory laparotomy, lysis of adhesions, total abdominal hysterectomy with bilateral salpingo-oophorectomy, infracolic omentectomy, optimal tumor debulking(< 1 cm), recto-sigmoid resection with creation of end colostomy, cholecystectomy, mobilization of splenic flexure and liver with diaphragmatic stripping on 06/15/2015. The right diaphragm was cleared of tumor. During dissection, the diaphragm was entered and closed with sutures.  She received tamoxifen from 02/25/2016 - 03/14/2016.  She received 4 cycles of carboplatin and gemcitabine (03/21/2016 - 05/24/2016) with GCSF/Neulasta support.  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  Cymbalta began on 02/24/2017  PDL-1 testing revealed a combined score was 5 (>=1 positive).     [facility] genetic testing negative for APC, ATM, BMPR1A, BRCA1, BRCA2, BRIP1, CDH1, CDK4, CDKN2A, CHEK2, EPCAM, MLH1, MSH2, MSH6, MUTYH, NBN, PALB2, PMS2, PTEN, RAD51C, RAD51D, SMAD4, STK11, TP53. Sequencing for select regions of POLE and POLD1, and large rearrangement analysis for select regions of GREM1 were negative.  She received 2 cycles of Doxil (06/21/2016 - 07/19/2016)  with Neulasta support.  She received 4 cycles of topotecan with Neulasta support (08/22/2016 - 10/10/2016).    She received 3 cycles of pembrolizumab (Keytruda) from 12/06/2016 -  01/09/2017.  Cycle #1 was complicated by neutropenia (ANC 400) requiring GCSF/Granix x 3 days.  She received GCSF x 5 days with cycle #2.  Abdomen and pelvic CT  on 01/20/2017 revealed complex mixed interval changes in widespread metastatic disease  in the peritoneal cavity, lower chest and thoracic, abdominal and pelvic lymph nodes. Right lower chest pleural metastasis and several of the peritoneal tumor implants had increased.  There was new right inguinal lymphadenopathy.  Some of the peritoneal tumor implants had decreased.  The small dependent left pleural effusion was slightly increased .  The mild left hydroureteronephrosis was improved.  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   The chronic biliary ductal dilatation was stable .  There was no evidence of bowel obstruction.  CA125 was 802.9 on 03/30/2015, 567.9 on 04/13/2015, 168.8 on 05/15/2015, 85.2 on 07/17/2015, 68.6 on 07/28/2015, 34.5 on 09/17/2015, 20.7 on 11/06/2015, 49 on 01/22/2016, 106.1 on 02/22/2016, 138.8 on 03/07/2016, 220.8 on 03/21/2016, 152.8 on 04/11/2016, 125.6 on 04/18/2016, 76.8 on 05/03/2016, 60.2 on 05/24/2016, 44 on 07/19/2016, 65.8 on 08/17/2016, 53.4 on 09/12/2016, 47.3 on 10/10/2016, 91 on 12/20/2016, 141.1 on 01/09/2017, 463.7 on 02/27/2017, 907.1 on 03/27/2017, 192.1 on 05/22/2017, 189.1 on 05/24/2017, and 144.3 on 06/19/2017.  She has a history of recurrent right sided pleural effusion.  She underwent thoracentesis of 650 cc on post-operative day 3.  She was admitted to [facility] on 07/01/2015 and 07/07/2015 for recurrent shortness of breath.  She underwent 2 additional thoracenteses (1.1 L on 07/02/2015 and 850 cc on 07/08/2015).  Cytology was negative x 2.  Bilateral lower extremity duplex on 07/03/2015 was negative.  Echo revealed an EF of 55-60% on 07/08/2015 and 60-65% on 05/27/2016.    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 She was on Lovenox 60 mg twice a day.  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  She has severe rheumatoid arthritis.  Methotrexate and Enbrel were  initially on hold.  She has a normocytic anemia.  Work-up on 02/17/2015 and 05/24/2016 revealed a normal ferritin, B12, folate, TSH.  She denies any melena or hematochezia.    She has anemia due to chronic disease. She received 1 unit PRBCs during her admission at [facility]. She denies any melena or hematochezia. She has diabetes and is on an insulin pump.  She has had persistent neutropenia felt secondary to her rheumatoid arthritis.  Folate and MMA were normal.  TSH was 6.13 (high) with a free T4 of 1.17 (0.61-1.12).  She began methotrexate (10 mg a week) and prednisone (5 mg a day) for severe rheumatoid arthritis on 12/17/2015.  She remains on prednisone alone (5 mg a day).  Bone marrow aspirate and biopsy on 06/09/2010 revealed a hypercellular marrow (70%) with no evidence of dysplasia or malignancy.  Flow cytometry was negative.  Cytogenetics were normal (46,XX).  FISH studies were negative for MDS.  Bone marrow aspirate and biopsy on 12/03/2015 revealed a normocellular to mildy hypercellular marrow for age (40%) with left shifted myelopoiesis, non specific dyserythropoiesis and mild megakaryocytic atypia with no increase in blasts.  There were multiple small nonspecific lymphoid aggregates (favor reactive). There was no increase in reticulin.  There was decreased myeloid cells (37%) with left shifted maturation and 1% atypical myelod blasts.  There was relatively increased monocytic cells (11%), relatively increased lymphoid cells (36%), and relatively increased eosinophils (6%).  Cytogenetics were normal (34, XX).  SNP microarray was normal.  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  She receives IV magnesium twice weekly.    She has chemotherapy induced anemia.  She has received Procrit (last 06/26/2017).  She received 1 unit of PRBCs on 06/05/2017.  Labs on 08/22/2016 revealed a normal B12 and folate.  Ferritin was 201, iron saturation 21% and TIBC 307 on 06/09/2017.  B12 and folate  were normal.  Code status is DNR/DNI.  She developed acute LFT elevation on 11/28/2016.  Event was preceded by upper abdominal discomfort.  Hepatitis B core antibody total and hepatitis C antibody were negative.  She received 5 cycles of Abraxane (03/13/2017 - 06/26/2017).  Cycle #1 was truncated after day 1 secondary to side effects and Thanksgiving.  Chest, abdomen and pelvic CT on 07/10/2017 revealed progression of disease in the abdomen pelvis.  There was interval increase in size of hepatic lesions most noticeably in the LEFT hepatic lobe.  There was interval increase in bilateral hydronephrosis and hydroureter.  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  There was interval increase in size of large peritoneal and pelvic peritoneal implants.  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  She had increased LFTs on 10/23/2017.  RUQ ultrasound on 10/24/2017 revealed multiple liver metastasis, the largest of 5.2 cm. There was no hydronephrosis.  There was a right pleural effusion.  She underwent bilateral ureteral stent placement on 07/13/2017.  She was admitted to Hospice on 07/15/2017.  Symptomatically, she is not feeling well.  Weight is down 7 pounds secondary to poor oral intake and nausea/vomiting.  She has a history of esophageal stenosis requiring dilatation.  Exam reveals increasing abdominal nodularity.  Alkaline phosphatase is 162.  Plan: 1. Labs today: CBC with diff, CMP, Mg. 2. Discuss early satiety and vomiting after eating. Given her symptoms, there is the potential for mass-effect on her esophagus related to her metastatic disease and abdominal carcinomatosis. Will refer to Dr. Allen Norris for further evaluation.  3. Discuss magnesium. Magnesium 1.7 today.  She continues on magnesium glycinate 200 mg BID, which is not associated with any significant diarrhea. Continue routine lab monitoring.   4. Discuss increased LFTs and increasing liver metastasis.  No biliary ductal dilatation. 5. Discuss anemia.  Continue oral iron.  No transfusion needed at this time.  Check CBC monthly. 6. Discuss ongoing symptom management including pain medications and antiemetics. Continue to coordinate care with Hospice. 7. Discuss protein calorie malnutrition. Patient continues to lose weight. Her weight today is 115 lb 12.8 oz (52.5 kg). Her BMI of 20.51 kg/m places her in a normal weight category. Encouraged her to increase her intake of calorie and protein dense food choices. Additionally, patient was encouraged to utilize nutritional supplement shakes at least 2-3 times a day.  8. RTC on Mondays and Fridays x 3 weeks for labs (Mg) and +/- Mg.  9. RTC in 1 month for MD assessment and labs (CBC with diff, CMP, Mg), and +/- Mg.    Honor Loh, NP 11/24/17, 9:43 AM   I saw and evaluated the patient, participating in the key portions of the service and reviewing pertinent diagnostic studies and records.  I reviewed the nurse practitioner's note and  agree with the findings and the plan.  The assessment and plan were discussed with the patient.  Several questions were asked by the patient and answered.   Nolon Stalls, MD 11/24/17, 9:43 AM

## 2017-11-27 ENCOUNTER — Inpatient Hospital Stay

## 2017-11-27 DIAGNOSIS — C561 Malignant neoplasm of right ovary: Secondary | ICD-10-CM

## 2017-11-27 DIAGNOSIS — C800 Disseminated malignant neoplasm, unspecified: Principal | ICD-10-CM

## 2017-11-27 DIAGNOSIS — C569 Malignant neoplasm of unspecified ovary: Secondary | ICD-10-CM | POA: Diagnosis not present

## 2017-11-27 LAB — MAGNESIUM: Magnesium: 1.8 mg/dL (ref 1.7–2.4)

## 2017-11-27 NOTE — Progress Notes (Signed)
Magnesium 1.8, Per Dr. Mike Gip no Magnesium needed today, okay to d/c pt home. Pt aware and stable at discharge.

## 2017-11-28 ENCOUNTER — Ambulatory Visit
Admission: RE | Admit: 2017-11-28 | Discharge: 2017-11-28 | Disposition: A | Discharge status: Home / Self Care | Source: Ambulatory Visit | Attending: Gastroenterology | Admitting: Gastroenterology

## 2017-11-28 ENCOUNTER — Ambulatory Visit: Admitting: Anesthesiology

## 2017-11-28 ENCOUNTER — Encounter: Payer: Self-pay | Admitting: Gastroenterology

## 2017-11-28 ENCOUNTER — Encounter
Admission: RE | Disposition: A | Discharge status: Home / Self Care | Payer: Self-pay | Source: Ambulatory Visit | Attending: Gastroenterology

## 2017-11-28 DIAGNOSIS — I252 Old myocardial infarction: Secondary | ICD-10-CM | POA: Diagnosis not present

## 2017-11-28 DIAGNOSIS — C7889 Secondary malignant neoplasm of other digestive organs: Secondary | ICD-10-CM | POA: Insufficient documentation

## 2017-11-28 DIAGNOSIS — E785 Hyperlipidemia, unspecified: Secondary | ICD-10-CM | POA: Insufficient documentation

## 2017-11-28 DIAGNOSIS — I110 Hypertensive heart disease with heart failure: Secondary | ICD-10-CM | POA: Diagnosis not present

## 2017-11-28 DIAGNOSIS — R1310 Dysphagia, unspecified: Secondary | ICD-10-CM | POA: Diagnosis present

## 2017-11-28 DIAGNOSIS — Z8673 Personal history of transient ischemic attack (TIA), and cerebral infarction without residual deficits: Secondary | ICD-10-CM | POA: Diagnosis not present

## 2017-11-28 DIAGNOSIS — M1990 Unspecified osteoarthritis, unspecified site: Secondary | ICD-10-CM | POA: Insufficient documentation

## 2017-11-28 DIAGNOSIS — M359 Systemic involvement of connective tissue, unspecified: Secondary | ICD-10-CM | POA: Diagnosis not present

## 2017-11-28 DIAGNOSIS — K2960 Other gastritis without bleeding: Secondary | ICD-10-CM | POA: Insufficient documentation

## 2017-11-28 DIAGNOSIS — Z9221 Personal history of antineoplastic chemotherapy: Secondary | ICD-10-CM | POA: Insufficient documentation

## 2017-11-28 DIAGNOSIS — M069 Rheumatoid arthritis, unspecified: Secondary | ICD-10-CM | POA: Diagnosis not present

## 2017-11-28 DIAGNOSIS — Z79899 Other long term (current) drug therapy: Secondary | ICD-10-CM | POA: Insufficient documentation

## 2017-11-28 DIAGNOSIS — D490 Neoplasm of unspecified behavior of digestive system: Secondary | ICD-10-CM

## 2017-11-28 DIAGNOSIS — E1151 Type 2 diabetes mellitus with diabetic peripheral angiopathy without gangrene: Secondary | ICD-10-CM | POA: Insufficient documentation

## 2017-11-28 DIAGNOSIS — Z885 Allergy status to narcotic agent status: Secondary | ICD-10-CM | POA: Insufficient documentation

## 2017-11-28 DIAGNOSIS — R1319 Other dysphagia: Secondary | ICD-10-CM

## 2017-11-28 DIAGNOSIS — Z7901 Long term (current) use of anticoagulants: Secondary | ICD-10-CM | POA: Insufficient documentation

## 2017-11-28 DIAGNOSIS — K222 Esophageal obstruction: Secondary | ICD-10-CM

## 2017-11-28 DIAGNOSIS — Z888 Allergy status to other drugs, medicaments and biological substances status: Secondary | ICD-10-CM | POA: Insufficient documentation

## 2017-11-28 DIAGNOSIS — Z87891 Personal history of nicotine dependence: Secondary | ICD-10-CM | POA: Insufficient documentation

## 2017-11-28 DIAGNOSIS — I509 Heart failure, unspecified: Secondary | ICD-10-CM | POA: Diagnosis not present

## 2017-11-28 DIAGNOSIS — Z794 Long term (current) use of insulin: Secondary | ICD-10-CM | POA: Diagnosis not present

## 2017-11-28 DIAGNOSIS — F329 Major depressive disorder, single episode, unspecified: Secondary | ICD-10-CM | POA: Insufficient documentation

## 2017-11-28 DIAGNOSIS — K229 Disease of esophagus, unspecified: Secondary | ICD-10-CM | POA: Diagnosis not present

## 2017-11-28 DIAGNOSIS — K219 Gastro-esophageal reflux disease without esophagitis: Secondary | ICD-10-CM | POA: Diagnosis not present

## 2017-11-28 DIAGNOSIS — I2510 Atherosclerotic heart disease of native coronary artery without angina pectoris: Secondary | ICD-10-CM | POA: Diagnosis not present

## 2017-11-28 DIAGNOSIS — Z8543 Personal history of malignant neoplasm of ovary: Secondary | ICD-10-CM | POA: Diagnosis not present

## 2017-11-28 DIAGNOSIS — B3781 Candidal esophagitis: Secondary | ICD-10-CM | POA: Diagnosis not present

## 2017-11-28 HISTORY — PX: ESOPHAGOGASTRODUODENOSCOPY (EGD) WITH PROPOFOL: SHX5813

## 2017-11-28 LAB — KOH PREP

## 2017-11-28 LAB — GLUCOSE, CAPILLARY: Glucose-Capillary: 190 mg/dL — ABNORMAL HIGH (ref 70–99)

## 2017-11-28 SURGERY — ESOPHAGOGASTRODUODENOSCOPY (EGD) WITH PROPOFOL
Anesthesia: General

## 2017-11-28 MED ORDER — PROPOFOL 10 MG/ML IV BOLUS
INTRAVENOUS | Status: DC | PRN
  Administered 2017-11-28: 20 mg via INTRAVENOUS
  Administered 2017-11-28: 60 mg via INTRAVENOUS

## 2017-11-28 MED ORDER — PROPOFOL 10 MG/ML IV BOLUS
INTRAVENOUS | Status: AC
  Filled 2017-11-28: qty 20

## 2017-11-28 MED ORDER — PROPOFOL 500 MG/50ML IV EMUL
INTRAVENOUS | Status: DC | PRN
  Administered 2017-11-28: 120 ug/kg/min via INTRAVENOUS

## 2017-11-28 MED ORDER — SODIUM CHLORIDE 0.9 % IV SOLN
INTRAVENOUS | Status: DC
  Administered 2017-11-28: 1000 mL via INTRAVENOUS

## 2017-11-28 MED ORDER — LIDOCAINE HCL (CARDIAC) PF 100 MG/5ML IV SOSY
PREFILLED_SYRINGE | INTRAVENOUS | Status: DC | PRN
  Administered 2017-11-28: 50 mg via INTRAVENOUS

## 2017-11-28 NOTE — Op Note (Signed)
[facility]astroenter[facility] Patient Name: [name] Procedure Date: 11/28/2017 8:17 AM MRN: [id number] Account #: [id number] Date of Birth: 02-21-1946 Admit Type: Outpatient Age: 72 Room: [facility] ENDO ROOM 4 Gender: Female Note Status: Finalized Procedure:            Upper GI endoscopy Indications:          Dysphagia Providers:            Lucilla Lame MD, MD Referring MD:         Angela Adam. Caryl Bis (Referring MD), Melissa C. Corcoran                        (Referring MD) Medicines:            Propofol per Anesthesia Complications:        No immediate complications. Procedure:            Pre-Anesthesia Assessment:                       - Prior to the procedure, a History and Physical was                        performed, and patient medications and allergies were                        reviewed. The patient's tolerance of previous                        anesthesia was also reviewed. The risks and benefits of                        the procedure and the sedation options and risks were                        discussed with the patient. All questions were                        answered, and informed consent was obtained. Prior                        Anticoagulants: The patient has taken no previous                        anticoagulant or antiplatelet agents. ASA Grade                        Assessment: II - A patient with mild systemic disease.                        After reviewing the risks and benefits, the patient was                        deemed in satisfactory condition to undergo the                        procedure.                       After obtaining informed consent, the endoscope was  passed under direct vision. Throughout the procedure,                        the patient's blood pressure, pulse, and oxygen                        saturations were monitored continuously. The Endoscope                        was introduced through  the mouth, and advanced to the                        second part of duodenum. The upper GI endoscopy was                        accomplished without difficulty. The patient tolerated                        the procedure well. Findings:      Patchy, white plaques were found in the entire esophagus. Brushings for       KOH prep were obtained.      One benign-appearing, intrinsic mild stenosis was found at the       gastroesophageal junction. The stenosis was traversed. A TTS dilator was       passed through the scope. Dilation with a 15-16.5-18 mm balloon dilator       was performed to 18 mm. The dilation site was examined following       endoscope reinsertion and showed no change.      A large, ulcerated, circumferential mass with no bleeding and stigmata       of recent bleeding was found in the gastric body. Biopsies were taken       with a cold forceps for histology.      Multiple medium pedunculated polyps with no bleeding and no stigmata of       recent bleeding were found in the entire examined stomach. Biopsies were       taken with a cold forceps for histology.      The examined duodenum was normal. Impression:           - Esophageal plaques were found, consistent with                        candidiasis. Brushings performed.                       - Benign-appearing esophageal stenosis. Dilated.                       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                       - Multiple gastric polyps. Biopsied.                       - Normal examined duodenum. Recommendation:       - Discharge patient to home.                       - Resume previous diet.                       -  Continue present medications.                       - Await pathology results. Procedure Code(s):    --- Professional ---                       [phone], Esophagogastroduodenoscopy, flexible, transoral;                        with transendoscopic balloon dilation of esophagus                         (less than 30 mm diameter)                       43239, Esophagogastroduodenoscopy, flexible, transoral;                        with biopsy, single or multiple Diagnosis Code(s):    --- Professional ---                       R13.10, Dysphagia, unspecified                       D49.0, Neoplasm of unspecified behavior of digestive                        system                       K22.2, Esophageal obstruction                       K22.9, Disease of esophagus, unspecified CPT copyright 2017 American [employer] Association. All rights reserved. The codes documented in this report are preliminary and upon coder review may  be revised to meet current compliance requirements. Lucilla Lame MD, MD 11/28/2017 8:35:55 AM This report has been signed electronically. Number of Addenda: 0 Note Initiated On: 11/28/2017 8:17 AM      [facility]

## 2017-11-28 NOTE — Anesthesia Preprocedure Evaluation (Signed)
Anesthesia Evaluation  Patient identified by MRN, date of birth, ID band Patient awake    Reviewed: Allergy & Precautions, NPO status , Patient's Chart, lab work & pertinent test results  History of Anesthesia Complications Negative for: history of anesthetic complications  Airway Mallampati: II  TM Distance: >3 FB Neck ROM: Full    Dental no notable dental hx.    Pulmonary neg sleep apnea, neg COPD, former smoker,    breath sounds clear to auscultation- rhonchi (-) wheezing      Cardiovascular hypertension, + CAD, + Past MI, + CABG and + Peripheral Vascular Disease  (-) Cardiac Stents  Rhythm:Regular Rate:Normal - Systolic murmurs and - Diastolic murmurs    Neuro/Psych PSYCHIATRIC DISORDERS Depression CVA    GI/Hepatic Neg liver ROS, hiatal hernia, GERD  ,  Endo/Other  diabetes, Insulin Dependent  Renal/GU Renal disease     Musculoskeletal  (+) Arthritis ,   Abdominal (+) - obese,   Peds  Hematology  (+) anemia ,   Anesthesia Other Findings Past Medical History: No date: Anemia No date: Arthritis No date: CAD (coronary artery disease)     Comment:  a. 09/2012 Cath: LM nl, LAD 95p, 2m LCX 979mOM2 50,               RCA 100. No date: CHF (congestive heart failure) (HCC) No date: Cholelithiasis No date: Collagen vascular disease ([facility]    Comment:  Rheumatoid Arthritis. No date: Depression No date: Environmental and seasonal allergies No date: Esophageal stricture No date: Exertional shortness of breath No date: GERD (gastroesophageal reflux disease) No date: H/O hiatal hernia No date: Herniated disc No date: History of pancytopenia No date: Hyperlipidemia No date: Hypertension No date: Hypokalemia 2012: Leukopenia     Comment:  s/p bone marrow biopsy, Dr. PaMa Hillocko date: Neuropathy No [facility]: [facility]    Comment:  hands and feet, due to chemo 08/2012: NSTEMI (non-ST  elevated myocardial infarction) ([facility]    Comment:  "mild" (10/18/2012) 2016: Ovarian cancer ([facility]    Comment:  chemo and hysterectomy 2013; 08/2012: Pneumonia     Comment:  "one lung; double" (10/18/2012) No date: Rheumatoid arthritis(714.0) No date: [facility]    Comment:  no residual deficiets No date: Type I diabetes mellitus ([facility]    Comment:  "dx'd in 1957" (10/18/2012) No date: Ureter obstruction   Reproductive/Obstetrics                             Anesthesia Physical Anesthesia Plan  ASA: III  Anesthesia Plan: General   Post-op Pain Management:    Induction: Intravenous  PONV Risk Score and Plan: 2 and Propofol infusion  Airway Management Planned: Natural Airway  Additional Equipment:   Intra-op Plan:   Post-operative Plan:   Informed Consent: I have reviewed the patients History and Physical, chart, labs and discussed the procedure including the risks, benefits and alternatives for the proposed anesthesia with the patient or authorized representative who has indicated his/her understanding and acceptance.   Dental advisory given  Plan Discussed with: CRNA and Anesthesiologist  Anesthesia Plan Comments:         Anesthesia Quick Evaluation

## 2017-11-28 NOTE — Anesthesia Procedure Notes (Signed)
Performed by: Halynn Reitano, CRNA Pre-anesthesia Checklist: Patient identified, Emergency Drugs available, Suction available, Patient being monitored and Timeout performed Patient Re-evaluated:Patient Re-evaluated prior to induction Oxygen Delivery Method: Nasal cannula Induction Type: IV induction       

## 2017-11-28 NOTE — Anesthesia Post-op Follow-up Note (Signed)
Anesthesia QCDR form completed.        

## 2017-11-28 NOTE — H&P (Signed)
Janet Lame, MD [facility] [address], [name], [place] [address] Phone:[phone] Fax : [phone]  Primary Care Physician:  Leone Haven, MD Primary Gastroenterologist:  Dr. Allen Norris  Pre-Procedure History & Physical: HPI:  [name] is a 72 y.o. female is here for an endoscopy.   Past Medical History:  Diagnosis Date  . Anemia   . Arthritis   . CAD (coronary artery disease)    a. 09/2012 Cath: LM nl, LAD 95p, 59m LCX 975mOM2 50, RCA 100.  . Marland KitchenHF (congestive heart failure) ([facility]  . Cholelithiasis   . Collagen vascular disease (HCC)    Rheumatoid Arthritis.  . Depression   . Environmental and seasonal allergies   . Esophageal stricture   . Exertional shortness of breath   . GERD (gastroesophageal reflux disease)   . H/O hiatal hernia   . Herniated disc   . History of pancytopenia   . Hyperlipidemia   . Hypertension   . Hypokalemia   . Leukopenia 2012   s/p bone marrow biopsy, Dr. PaMa Hillock. Neuropathy   . 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 NSTEMI (non-ST elevated myocardial infarction) ([place]5/2014   "mild" (10/18/2012)  . Ovarian cancer (HC[place]2016   chemo and hysterectomy  . Pneumonia 2013; 08/2012   "one lung; double" (10/18/2012)  . Rheumatoid arthritis(714.0)   . [facility] ([facility]   no residual deficiets  . Type I diabetes mellitus (HC[place]   "dx'd in 1957" (10/18/2012)  . Ureter obstruction     Past Surgical History:  Procedure Laterality Date  . ABDOMINAL HYSTERECTOMY  06/15/2015   Dx L/S, EXLAP TAH BSO omentectomy RSRx colostomy diaphragm resection stripping  . CARDIAC CATHETERIZATION  10/18/2012   "first one was today" (10/18/2012)  . CATARACT EXTRACTION W/ INTRAOCULAR LENS IMPLANT Right 2010  . CHOLECYSTECTOMY  06/15/2015   combined case with ovarian cancer debulking  . COLON SURGERY  2017   colectomy with colostomy  . CORONARY ARTERY BYPASS GRAFT N/A 10/19/2012   Procedure: CORONARY ARTERY BYPASS  GRAFTING (CABG);  Surgeon: StMelrose NakayamaMD;  [place] Service: Open Heart Surgery;  Laterality: N/A;  . CYSTOSCOPY W/ RETROGRADES Bilateral 07/13/2017   Procedure: CYSTOSCOPY WITH RETROGRADE PYELOGRAM;  Surgeon: BrHollice EspyMD;  Location: [facility] ORS;  Service: Urology;  Laterality: Bilateral;  . CYSTOSCOPY W/ RETROGRADES Left 11/13/2017   Procedure: CYSTOSCOPY WITH RETROGRADE PYELOGRAM;  Surgeon: BrHollice EspyMD;  Location: [facility] ORS;  Service: Urology;  Laterality: Left;  . CYSTOSCOPY W/ URETERAL STENT PLACEMENT Bilateral 11/13/2017   Procedure: CYSTOSCOPY WITH STENT REPLACEMENT (Exchange);  Surgeon: BrHollice EspyMD;  Location: [facility] ORS;  Service: Urology;  Laterality: Bilateral;  . CYSTOSCOPY WITH STENT PLACEMENT Bilateral 07/13/2017   Procedure: CYSTOSCOPY WITH STENT PLACEMENT;  Surgeon: BrHollice EspyMD;  Location: [facility] ORS;  Service: Urology;  Laterality: Bilateral;  . ESOPHAGEAL DILATION     "3 or 4 times" (10/19/2011)  . ESOPHAGOGASTRODUODENOSCOPY  2012   Dr. IfMinna Merritts. EYE SURGERY     bilateral cataract  . OSTOMY    . OVARY SURGERY     removal  . PERIPHERAL VASCULAR CATHETERIZATION N/A 03/02/2015   Procedure: PoGlori Luisath Insertion;  Surgeon: JaAlgernon HuxleyMD;  [place];  Service: Cardiovascular;  Laterality: N/A;  . [place]  Prior to Admission medications   Medication Sig Start Date  End Date Taking? Authorizing Provider  Calcium-Vitamin D 600-200 MG-UNIT tablet Take 1 tablet by mouth daily.    Yes [provider]  cholecalciferol (VITAMIN D) 1000 units tablet Take 1,000 Units by mouth daily.   Yes [provider]  enoxaparin (LOVENOX) 60 MG/0.6ML injection INJECT 0.6ML (60MG TOTAL) UNDER THE SKINEVERY 12 HOURS 11/20/17  Yes Karen Kitchens, NP  ferrous sulfate 325 (65 FE) MG EC tablet Take 325 mg by mouth daily with breakfast.   Yes [provider]  folic acid (FOLVITE) 1 MG tablet TAKE 1 TABLET  BY MOUTH ONCE DAILY 09/05/17  Yes Leone Haven, MD  insulin lispro (HUMALOG) 100 UNIT/ML injection Inject 0.06 mLs (6 Units total) into the skin daily. Patient taking differently: Inject 0-85 Units into the skin daily. Via insulin pump 11/20/15  Yes Jackolyn Confer, MD  lidocaine-prilocaine (EMLA) cream APPLY 1 APPLICATION TOPICALLY AS NEEDED 1 HOUR PRIOR TO TREATMENT. COVER WITH PRESS N SEAL UNTIL TREATMENT TIME AS DIRECTED 03/20/17  Yes Corcoran, Melissa C, MD  loratadine (CLARITIN) 10 MG tablet Take 1 tablet by mouth daily as needed for allergies.    Yes [provider]  magnesium oxide (MAG-OX) 400 (241.3 Mg) MG tablet TAKE 1 TABLET BY MOUTH ONCE DAILY Patient taking differently: TAKE 2 TABLET BY MOUTH ONCE DAILY 09/06/16  Yes Corcoran, Melissa C, MD  ondansetron (ZOFRAN) 8 MG tablet TAKE 1 TABLET BY MOUTH EVERY 8 HOURS AS NEEDED FOR NAUSEA AND VOMITING 06/07/17  Yes Corcoran, Melissa C, MD  pantoprazole (PROTONIX) 40 MG tablet TAKE 1 TABLET BY MOUTH TWICE (2) DAILY 11/04/16  Yes Leone Haven, MD  potassium chloride SA (K-DUR,KLOR-CON) 20 MEQ tablet TAKE 1 TABLET BY MOUTH ONCE A DAY 04/04/17  Yes Karen Kitchens, NP  predniSONE (DELTASONE) 5 MG tablet Take 5 mg by mouth 2 (two) times daily with a meal.    Yes [provider]  vitamin C (ASCORBIC ACID) 500 MG tablet Take 500 mg by mouth daily.   Yes [provider]  zinc gluconate 50 MG tablet Take 50 mg daily by mouth.   Yes [provider]  DULoxetine (CYMBALTA) 20 MG capsule Take 1 capsule (20 mg total) by mouth daily. 05/12/17 11/24/17  Leone Haven, MD    Allergies as of 11/24/2017 - Review Complete 11/24/2017  Allergen Reaction Noted  . Adhesive [tape]  11/07/2017  . Codeine Nausea And Vomiting 01/10/2011    Family History  Problem Relation Age of Onset  . Diabetes Mother   . Arthritis Mother   . Diabetes Father   . Arthritis Father   . Aneurysm Sister        neck  . Cancer Sister         lung    Social History   Socioeconomic History  . Marital status: Married    Spouse name: Not on file  . Number of children: 3  . Years of education: Not on file  . Highest education level: Not on file  Occupational History  . 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 Financial resource strain: Not on file  . Food insecurity:    Worry: Not on file    Inability: Not on file  . Transportation needs:    Medical: Not on file    Non-medical: Not on file  Tobacco Use  . Smoking status: Former Smoker    Packs/day: 1.00    Years: 30.00  Pack years: 30.00    Types: Cigarettes    Last attempt to quit: 06/11/1994    Years since quitting: 23.4  . Smokeless tobacco: Never Used  Substance and Sexual Activity  . Alcohol use: No  . Drug use: No  . Sexual activity: Never  Lifestyle  . Physical activity:    Days per week: Not on file    Minutes per session: Not on file  . Stress: Not on file  Relationships  . Social connections:    Talks on phone: Not on file    Gets together: Not on file    Attends religious service: Not on file    Active member of club or organization: Not on file    Attends meetings of clubs or organizations: Not on file    Relationship status: Not on file  . Intimate partner violence:    Fear of current or ex partner: Not on file    Emotionally abused: Not on file    Physically abused: Not on file    Forced sexual activity: Not on file  Other Topics Concern  . Not on file  Social History Narrative  . Not on file    Review of Systems: See HPI, otherwise negative ROS  Physical Exam: BP (!) 145/70   Pulse 90   Temp (!) 97 F (36.1 C) (Tympanic)   Resp 18   Ht 5' 3"  (1.6 m)   Wt 115 lb (52.2 kg)   SpO2 100%   BMI 20.37 kg/m  General:   Alert,  pleasant and cooperative in NAD Head:  Normocephalic and atraumatic. Neck:  Supple; no masses or thyromegaly. Lungs:  Clear throughout to auscultation.    Heart:  Regular rate and  rhythm. Abdomen:  Soft, nontender and nondistended. Normal bowel sounds, without guarding, and without rebound.   Neurologic:  Alert and  oriented x4;  grossly normal neurologically.  Impression/Plan: [name] is here for an endoscopy to be performed for dysphagia  Risks, benefits, limitations, and alternatives regarding  endoscopy have been reviewed with the patient.  Questions have been answered.  All parties agreeable.   Janet Lame, MD  11/28/2017, 8:15 AM

## 2017-11-28 NOTE — Anesthesia Postprocedure Evaluation (Signed)
Anesthesia Post Note  Patient: [name]  Procedure(s) Performed: ESOPHAGOGASTRODUODENOSCOPY (EGD) WITH PROPOFOL (N/A )  Patient location during evaluation: Endoscopy Anesthesia Type: General Level of consciousness: awake and alert and oriented Pain management: pain level controlled Vital Signs Assessment: post-procedure vital signs reviewed and stable Respiratory status: spontaneous breathing, nonlabored ventilation and respiratory function stable Cardiovascular status: blood pressure returned to baseline and stable Postop Assessment: no signs of nausea or vomiting Anesthetic complications: no     Last Vitals:  Vitals:   11/28/17 0917 11/28/17 0927  BP: 118/74 (!) 149/64  Pulse: 80 77  Resp: 19 13  Temp:    SpO2: 98% 99%    Last Pain:  Vitals:   11/28/17 0927  TempSrc:   PainSc: 0-No pain                 Yalexa Blust

## 2017-11-28 NOTE — Transfer of Care (Signed)
Immediate Anesthesia Transfer of Care Note  Patient: [name]  Procedure(s) Performed: ESOPHAGOGASTRODUODENOSCOPY (EGD) WITH PROPOFOL (N/A )  Patient Location: PACU  Anesthesia Type:General  Level of Consciousness: sedated and responds to stimulation  Airway & Oxygen Therapy: Patient Spontanous Breathing and Patient connected to nasal cannula oxygen  Post-op Assessment: Report given to RN and Post -op Vital signs reviewed and stable  Post vital signs: Reviewed and stable  Last Vitals:  Vitals Value Taken Time  BP    Temp    Pulse    Resp    SpO2      Last Pain:  Vitals:   11/28/17 0732  TempSrc: Tympanic  PainSc: 0-No pain         Complications: No apparent anesthesia complications

## 2017-11-30 LAB — SURGICAL PATHOLOGY

## 2017-12-01 ENCOUNTER — Inpatient Hospital Stay: Payer: Medicare Other

## 2017-12-04 ENCOUNTER — Inpatient Hospital Stay

## 2017-12-04 ENCOUNTER — Inpatient Hospital Stay: Attending: Hematology and Oncology

## 2017-12-04 VITALS — BP 145/79 | HR 98 | Resp 20

## 2017-12-04 DIAGNOSIS — F329 Major depressive disorder, single episode, unspecified: Secondary | ICD-10-CM | POA: Insufficient documentation

## 2017-12-04 DIAGNOSIS — Z87891 Personal history of nicotine dependence: Secondary | ICD-10-CM | POA: Insufficient documentation

## 2017-12-04 DIAGNOSIS — J90 Pleural effusion, not elsewhere classified: Secondary | ICD-10-CM | POA: Insufficient documentation

## 2017-12-04 DIAGNOSIS — K219 Gastro-esophageal reflux disease without esophagitis: Secondary | ICD-10-CM | POA: Insufficient documentation

## 2017-12-04 DIAGNOSIS — M069 Rheumatoid arthritis, unspecified: Secondary | ICD-10-CM | POA: Insufficient documentation

## 2017-12-04 DIAGNOSIS — B3781 Candidal esophagitis: Secondary | ICD-10-CM | POA: Insufficient documentation

## 2017-12-04 DIAGNOSIS — Z79899 Other long term (current) drug therapy: Secondary | ICD-10-CM | POA: Insufficient documentation

## 2017-12-04 DIAGNOSIS — G629 Polyneuropathy, unspecified: Secondary | ICD-10-CM | POA: Insufficient documentation

## 2017-12-04 DIAGNOSIS — E785 Hyperlipidemia, unspecified: Secondary | ICD-10-CM | POA: Insufficient documentation

## 2017-12-04 DIAGNOSIS — Z8673 Personal history of transient ischemic attack (TIA), and cerebral infarction without residual deficits: Secondary | ICD-10-CM | POA: Insufficient documentation

## 2017-12-04 DIAGNOSIS — C569 Malignant neoplasm of unspecified ovary: Secondary | ICD-10-CM | POA: Diagnosis present

## 2017-12-04 DIAGNOSIS — I252 Old myocardial infarction: Secondary | ICD-10-CM | POA: Diagnosis not present

## 2017-12-04 DIAGNOSIS — D709 Neutropenia, unspecified: Secondary | ICD-10-CM | POA: Diagnosis not present

## 2017-12-04 DIAGNOSIS — K2970 Gastritis, unspecified, without bleeding: Secondary | ICD-10-CM | POA: Insufficient documentation

## 2017-12-04 DIAGNOSIS — C787 Secondary malignant neoplasm of liver and intrahepatic bile duct: Secondary | ICD-10-CM | POA: Insufficient documentation

## 2017-12-04 DIAGNOSIS — I2510 Atherosclerotic heart disease of native coronary artery without angina pectoris: Secondary | ICD-10-CM | POA: Insufficient documentation

## 2017-12-04 DIAGNOSIS — N1330 Unspecified hydronephrosis: Secondary | ICD-10-CM | POA: Diagnosis not present

## 2017-12-04 DIAGNOSIS — I10 Essential (primary) hypertension: Secondary | ICD-10-CM | POA: Insufficient documentation

## 2017-12-04 DIAGNOSIS — Z66 Do not resuscitate: Secondary | ICD-10-CM | POA: Insufficient documentation

## 2017-12-04 DIAGNOSIS — I509 Heart failure, unspecified: Secondary | ICD-10-CM | POA: Insufficient documentation

## 2017-12-04 DIAGNOSIS — E109 Type 1 diabetes mellitus without complications: Secondary | ICD-10-CM | POA: Diagnosis not present

## 2017-12-04 DIAGNOSIS — K222 Esophageal obstruction: Secondary | ICD-10-CM | POA: Insufficient documentation

## 2017-12-04 DIAGNOSIS — R6881 Early satiety: Secondary | ICD-10-CM | POA: Insufficient documentation

## 2017-12-04 DIAGNOSIS — D6481 Anemia due to antineoplastic chemotherapy: Secondary | ICD-10-CM | POA: Diagnosis not present

## 2017-12-04 DIAGNOSIS — R1110 Vomiting, unspecified: Secondary | ICD-10-CM | POA: Diagnosis not present

## 2017-12-04 DIAGNOSIS — C786 Secondary malignant neoplasm of retroperitoneum and peritoneum: Secondary | ICD-10-CM | POA: Insufficient documentation

## 2017-12-04 DIAGNOSIS — C561 Malignant neoplasm of right ovary: Secondary | ICD-10-CM

## 2017-12-04 DIAGNOSIS — C800 Disseminated malignant neoplasm, unspecified: Principal | ICD-10-CM

## 2017-12-04 LAB — MAGNESIUM: Magnesium: 1.6 mg/dL — ABNORMAL LOW (ref 1.7–2.4)

## 2017-12-04 MED ORDER — SODIUM CHLORIDE 0.9 % IV SOLN: 2.0000 g | Freq: Once | INTRAVENOUS | Status: DC

## 2017-12-04 MED ORDER — MAGNESIUM SULFATE 2 GM/50ML IV SOLN
2.0000 g | Freq: Once | INTRAVENOUS | Status: AC
  Administered 2017-12-04: 2 g via INTRAVENOUS
  Filled 2017-12-04: qty 50

## 2017-12-04 MED ORDER — HEPARIN SOD (PORK) LOCK FLUSH 100 UNIT/ML IV SOLN
500.0000 [IU] | Freq: Once | INTRAVENOUS | Status: AC
  Administered 2017-12-04: 500 [IU] via INTRAVENOUS

## 2017-12-04 MED ORDER — SODIUM CHLORIDE 0.9% FLUSH
10.0000 mL | INTRAVENOUS | Status: DC | PRN
  Administered 2017-12-04: 10 mL via INTRAVENOUS
  Filled 2017-12-04: qty 10

## 2017-12-05 ENCOUNTER — Other Ambulatory Visit: Payer: Self-pay | Admitting: Hematology and Oncology

## 2017-12-05 ENCOUNTER — Telehealth: Payer: Self-pay

## 2017-12-05 DIAGNOSIS — C569 Malignant neoplasm of unspecified ovary: Secondary | ICD-10-CM

## 2017-12-05 DIAGNOSIS — C786 Secondary malignant neoplasm of retroperitoneum and peritoneum: Secondary | ICD-10-CM

## 2017-12-05 DIAGNOSIS — Z5111 Encounter for antineoplastic chemotherapy: Secondary | ICD-10-CM

## 2017-12-05 DIAGNOSIS — Z7189 Other specified counseling: Secondary | ICD-10-CM

## 2017-12-05 DIAGNOSIS — C801 Malignant (primary) neoplasm, unspecified: Secondary | ICD-10-CM

## 2017-12-05 DIAGNOSIS — D649 Anemia, unspecified: Secondary | ICD-10-CM

## 2017-12-05 NOTE — Telephone Encounter (Signed)
Ref Range & Units 11d ago      Potassium 3.5 - 5.1 mmol/L 4.3

## 2017-12-05 NOTE — Telephone Encounter (Signed)
-----   Message from Lucilla Lame, MD sent at 12/03/2017  9:15 AM EDT ----- Please have the patient follow-up with her oncologist.

## 2017-12-05 NOTE — Telephone Encounter (Signed)
Pts husband notified of results

## 2017-12-08 ENCOUNTER — Encounter: Payer: Self-pay | Admitting: Hematology and Oncology

## 2017-12-08 ENCOUNTER — Inpatient Hospital Stay

## 2017-12-08 ENCOUNTER — Inpatient Hospital Stay (HOSPITAL_BASED_OUTPATIENT_CLINIC_OR_DEPARTMENT_OTHER): Admitting: Hematology and Oncology

## 2017-12-08 VITALS — BP 135/78 | HR 103 | Temp 97.3°F | Resp 18 | Wt 115.2 lb

## 2017-12-08 DIAGNOSIS — C787 Secondary malignant neoplasm of liver and intrahepatic bile duct: Secondary | ICD-10-CM

## 2017-12-08 DIAGNOSIS — D709 Neutropenia, unspecified: Secondary | ICD-10-CM

## 2017-12-08 DIAGNOSIS — M069 Rheumatoid arthritis, unspecified: Secondary | ICD-10-CM

## 2017-12-08 DIAGNOSIS — K3189 Other diseases of stomach and duodenum: Secondary | ICD-10-CM

## 2017-12-08 DIAGNOSIS — J90 Pleural effusion, not elsewhere classified: Secondary | ICD-10-CM

## 2017-12-08 DIAGNOSIS — B3781 Candidal esophagitis: Secondary | ICD-10-CM

## 2017-12-08 DIAGNOSIS — C800 Disseminated malignant neoplasm, unspecified: Principal | ICD-10-CM

## 2017-12-08 DIAGNOSIS — R1110 Vomiting, unspecified: Secondary | ICD-10-CM

## 2017-12-08 DIAGNOSIS — F329 Major depressive disorder, single episode, unspecified: Secondary | ICD-10-CM

## 2017-12-08 DIAGNOSIS — Z79899 Other long term (current) drug therapy: Secondary | ICD-10-CM

## 2017-12-08 DIAGNOSIS — K219 Gastro-esophageal reflux disease without esophagitis: Secondary | ICD-10-CM

## 2017-12-08 DIAGNOSIS — C786 Secondary malignant neoplasm of retroperitoneum and peritoneum: Secondary | ICD-10-CM

## 2017-12-08 DIAGNOSIS — C569 Malignant neoplasm of unspecified ovary: Secondary | ICD-10-CM | POA: Diagnosis not present

## 2017-12-08 DIAGNOSIS — Z7189 Other specified counseling: Secondary | ICD-10-CM

## 2017-12-08 DIAGNOSIS — I10 Essential (primary) hypertension: Secondary | ICD-10-CM

## 2017-12-08 DIAGNOSIS — D500 Iron deficiency anemia secondary to blood loss (chronic): Secondary | ICD-10-CM

## 2017-12-08 DIAGNOSIS — C561 Malignant neoplasm of right ovary: Secondary | ICD-10-CM

## 2017-12-08 DIAGNOSIS — G629 Polyneuropathy, unspecified: Secondary | ICD-10-CM

## 2017-12-08 DIAGNOSIS — Z87891 Personal history of nicotine dependence: Secondary | ICD-10-CM

## 2017-12-08 DIAGNOSIS — D6481 Anemia due to antineoplastic chemotherapy: Secondary | ICD-10-CM

## 2017-12-08 DIAGNOSIS — I2510 Atherosclerotic heart disease of native coronary artery without angina pectoris: Secondary | ICD-10-CM

## 2017-12-08 DIAGNOSIS — Z95828 Presence of other vascular implants and grafts: Secondary | ICD-10-CM

## 2017-12-08 DIAGNOSIS — K222 Esophageal obstruction: Secondary | ICD-10-CM

## 2017-12-08 DIAGNOSIS — I509 Heart failure, unspecified: Secondary | ICD-10-CM

## 2017-12-08 DIAGNOSIS — R6881 Early satiety: Secondary | ICD-10-CM

## 2017-12-08 DIAGNOSIS — K2900 Acute gastritis without bleeding: Secondary | ICD-10-CM

## 2017-12-08 DIAGNOSIS — N1330 Unspecified hydronephrosis: Secondary | ICD-10-CM

## 2017-12-08 DIAGNOSIS — I252 Old myocardial infarction: Secondary | ICD-10-CM

## 2017-12-08 DIAGNOSIS — C801 Malignant (primary) neoplasm, unspecified: Secondary | ICD-10-CM

## 2017-12-08 DIAGNOSIS — Z8673 Personal history of transient ischemic attack (TIA), and cerebral infarction without residual deficits: Secondary | ICD-10-CM

## 2017-12-08 DIAGNOSIS — K2970 Gastritis, unspecified, without bleeding: Secondary | ICD-10-CM

## 2017-12-08 DIAGNOSIS — E785 Hyperlipidemia, unspecified: Secondary | ICD-10-CM

## 2017-12-08 DIAGNOSIS — E109 Type 1 diabetes mellitus without complications: Secondary | ICD-10-CM

## 2017-12-08 DIAGNOSIS — Z66 Do not resuscitate: Secondary | ICD-10-CM

## 2017-12-08 LAB — MAGNESIUM: Magnesium: 1.6 mg/dL — ABNORMAL LOW (ref 1.7–2.4)

## 2017-12-08 MED ORDER — HEPARIN SOD (PORK) LOCK FLUSH 100 UNIT/ML IV SOLN
INTRAVENOUS | Status: AC
  Filled 2017-12-08: qty 5

## 2017-12-08 MED ORDER — HEPARIN SOD (PORK) LOCK FLUSH 100 UNIT/ML IV SOLN
500.0000 [IU] | Freq: Once | INTRAVENOUS | Status: AC
  Administered 2017-12-08: 500 [IU] via INTRAVENOUS

## 2017-12-08 MED ORDER — FLUCONAZOLE 100 MG PO TABS: 100.0000 mg | ORAL_TABLET | Freq: Every day | ORAL | 0 refills | Status: AC

## 2017-12-08 NOTE — Progress Notes (Signed)
Patient is now mostly drinking ensure, glucerna and liquid diet.  Sometimes can swallow a cracker.  States she cannot get food to go down.  She has hiccups a lot.  No pain today.  Hospice started her on 0.5 mg Ativan yesterday.  Patient is accompanied by her husband and daughter today.

## 2017-12-08 NOTE — Progress Notes (Signed)
[facility] day:  12/27/17  Chief Complaint: [name] is a 72 y.o. female with stage IV ovarian cancer who is seen for review of interval EGD and discussion regarding direction of therapy.  HPI:  The patient was last seen in the medical oncology clinic on 11/24/2017.  At that time, she noted early satiety and intermittent emesis.  She had previously undergone esophageal dilatation.  She was referred to GI.  EGD on 11/28/2017 with Dr. Allen Norris revealed esophageal plaques c/w candidiasis. 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  Duodenum was normal.  Gastric biopsy was c/w metastasis of patient's known ovarian carcinoma.  There was erosive gastritis with iron pill gastritis. KOH prep revealed yeast with pseudohyphae.  During the interim, she states that she can't eat.  Solid foods don't stay down.  She is on a liquid diet.  She notes that her "blood sugar is off".  Blood sugar has been 170-220.  She stays sleepy.  She spends the majority of her day in bed and in a recliner.  She will walk to the bathroom and the kitchen.  She has shortness of breath on exertion.   Past Medical History:  Diagnosis Date  . Anemia   . Arthritis   . CAD (coronary artery disease)    a. 09/2012 Cath: LM nl, LAD 95p, 10m LCX 973mOM2 50, RCA 100.  . Marland KitchenHF (congestive heart failure) ([facility]  . Cholelithiasis   . Collagen vascular disease (HCC)    Rheumatoid Arthritis.  . Depression   . Environmental and seasonal allergies   . Esophageal stricture   . Exertional shortness of breath   . GERD (gastroesophageal reflux disease)   . H/O hiatal hernia   . Herniated disc   . History of pancytopenia   . Hyperlipidemia   . Hypertension   . Hypokalemia   . Leukopenia 2012   s/p bone marrow biopsy, Dr. PaMa Hillock. Neuropathy   . 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 NSTEMI (non-ST elevated myocardial infarction) ([place]   "mild" (10/18/2012)  . O[place]ian cancer ([facility]   chemo and hysterectomy  . Pneumonia 2013; 08/2012   "one lung; double" (10/18/2012)  . Rheumatoid arthritis(714.0)   . [facility]   no residual deficiets  . Type I diabetes mellitus ([facility]   "dx'd in 1957" (10/18/2012)  . Ureter obstruction     Past Surgical History:  Procedure Laterality Date  . ABDOMINAL HYSTERECTOMY  06/15/2015   Dx L/S, EXLAP TAH BSO omentectomy RSRx colostomy diaphragm resection stripping  . CARDIAC CATHETERIZATION  10/18/2012   "first one was today" (10/18/2012)  . CATARACT EXTRACTION W/ INTRAOCULAR LENS IMPLANT Right 2010  . CHOLECYSTECTOMY  06/15/2015   combined case with ovarian cancer debulking  . COLON SURGERY  2017   colectomy with colostomy  . CORONARY ARTERY BYPASS GRAFT N/A 10/19/2012   Procedure: CORONARY ARTERY BYPASS GRAFTING (CABG);  Surgeon: StMelrose NakayamaMD;  [place] [facility]: [facility];  Laterality: N/A;  . CYSTOSCOPY W/ RETROGRADES Bilateral 07/13/2017   Procedure: CYSTOSCOPY WITH RETROGRADE PYELOGRAM;  Surgeon: BrHollice EspyMD;  Location: [facility] ORS;  Service: Urology;  Laterality: Bilateral;  . CYSTOSCOPY W/ RETROGRADES Left 11/13/2017   Procedure: CYSTOSCOPY WITH RETROGRADE PYELOGRAM;  Surgeon: BrHollice EspyMD;  Location: [facility] ORS;  Service: Urology;  Laterality: Left;  . CYSTOSCOPY W/ URETERAL STENT PLACEMENT Bilateral 11/13/2017   Procedure: CYSTOSCOPY WITH STENT REPLACEMENT (Exchange);  Surgeon: Hollice Espy, MD;  Location: [facility] ORS;  Service: Urology;  Laterality: Bilateral;  . CYSTOSCOPY WITH STENT PLACEMENT Bilateral 07/13/2017   Procedure: CYSTOSCOPY WITH STENT PLACEMENT;  Surgeon: Hollice Espy, MD;  Location: [facility] ORS;  Service: Urology;  Laterality: Bilateral;  . ESOPHAGEAL DILATION     "3 or 4 times" (10/19/2011)  . ESOPHAGOGASTRODUODENOSCOPY  2012   Dr. Minna Merritts   . ESOPHAGOGASTRODUODENOSCOPY (EGD) WITH PROPOFOL N/A 11/28/2017   Procedure: ESOPHAGOGASTRODUODENOSCOPY (EGD) WITH PROPOFOL;  Surgeon: Lucilla Lame, MD;  Location: [facility] ENDOSCOPY;  Service: Endoscopy;  Laterality: N/A;  . EYE SURGERY     bilateral cataract  . OSTOMY    . OVARY SURGERY     removal  . PERIPHERAL VASCULAR CATHETERIZATION N/A 03/02/2015   Procedure: Glori Luis Cath Insertion;  Surgeon: Algernon Huxley, MD;  Location: [facility] LAB;  Service: Cardiovascular;  Laterality: N/A;  . TUBAL LIGATION  1970    Family History  Problem Relation Age of Onset  . Diabetes Mother   . Arthritis Mother   . Diabetes Father   . Arthritis Father   . Aneurysm Sister        neck  . Cancer Sister        lung    Social History:  reports that she quit smoking about 23 years ago. Her smoking use included cigarettes. She has a 30.00 pack-year smoking history. She has never used smokeless tobacco. She reports that she does not drink alcohol or use drugs.  Patient is a retired Art therapist. Patient denies exposure to radiation and toxins. She spends every Thursday afternoon with her sister.  Her son is [name] and daughter is [name].  She is accompanied by her husband, [name], and daughter today.  Allergies:  Allergies  Allergen Reactions  . Adhesive [Tape]     Paper tape and tegaderm OK  . Codeine Nausea And Vomiting    Current Medications: Current Outpatient Medications  Medication Sig Dispense Refill  . Calcium-Vitamin D 600-200 MG-UNIT tablet Take 1 tablet by mouth daily.     . cholecalciferol (VITAMIN D) 1000 units tablet Take 1,000 Units by mouth daily.    . DULoxetine (CYMBALTA) 20 MG capsule Take 1 capsule (20 mg total) by mouth daily. 90 capsule 2  . enoxaparin (LOVENOX) 60 MG/0.6ML injection INJECT 0.6ML (60MG TOTAL) UNDER THE SKINEVERY 12 HOURS 36 mL 1  . ferrous sulfate 325 (65 FE) MG EC tablet Take 325 mg by mouth daily with breakfast.    . folic acid (FOLVITE) 1 MG tablet TAKE  1 TABLET BY MOUTH ONCE DAILY 90 tablet 3  . insulin lispro (HUMALOG) 100 UNIT/ML injection Inject 0.06 mLs (6 Units total) into the skin daily. (Patient taking differently: Inject 0-85 Units into the skin daily. Via insulin pump) 30 mL 3  . lidocaine-prilocaine (EMLA) cream APPLY 1 APPLICATION TOPICALLY AS NEEDED 1 HOUR PRIOR TO TREATMENT. COVER WITH PRESS N SEAL UNTIL TREATMENT TIME AS DIRECTED 30 g 1  . loratadine (CLARITIN) 10 MG tablet Take 1 tablet by mouth daily as needed for allergies.     Marland Kitchen LORazepam (ATIVAN) 0.5 MG tablet Take 0.5 mg by mouth every 8 (eight) hours.    . magnesium oxide (MAG-OX) 400 (241.3 Mg) MG tablet TAKE 1 TABLET BY MOUTH ONCE DAILY (Patient taking differently: TAKE 2 TABLET BY MOUTH  ONCE DAILY) 30 tablet 1  . pantoprazole (PROTONIX) 40 MG tablet TAKE 1 TABLET BY MOUTH TWICE (2) DAILY 180 tablet 1  . potassium chloride SA (K-DUR,KLOR-CON) 20 MEQ tablet TAKE 1 TABLET BY MOUTH ONCE A DAY 30 tablet 0  . predniSONE (DELTASONE) 5 MG tablet Take 5 mg by mouth 2 (two) times daily with a meal.     . vitamin C (ASCORBIC ACID) 500 MG tablet Take 500 mg by mouth daily.    Marland Kitchen zinc gluconate 50 MG tablet Take 50 mg daily by mouth.    . fluconazole (DIFLUCAN) 100 MG tablet Take 1 tablet (100 mg total) by mouth daily. 11 tablet 0   No current facility-administered medications for this visit.    Facility-Administered Medications Ordered in Other Visits  Medication Dose Route Frequency Provider Last Rate Last Dose  . 0.9 %  sodium chloride infusion   Intravenous Continuous Lequita Asal, MD   Stopped at 04/24/17 1140  . heparin lock flush 100 unit/mL  500 Units Intravenous Once Corcoran, Melissa C, MD      . magnesium sulfate IVPB 4 g 100 mL  4 g Intravenous Once Faythe Casa E, NP      . sodium chloride 0.9 % injection 10 mL  10 mL Intracatheter PRN Corcoran, Melissa C, MD      . sodium chloride 0.9 % injection 10 mL  10 mL Intravenous PRN Lequita Asal, MD   10 mL at  04/13/15 0852  . sodium chloride flush (NS) 0.9 % injection 10 mL  10 mL Intravenous PRN Corcoran, Melissa C, MD      . sodium chloride flush (NS) 0.9 % injection 10 mL  10 mL Intravenous PRN Lequita Asal, MD   10 mL at 10/23/17 0840    Review of Systems  Constitutional: Positive for malaise/fatigue and weight loss (no futher weight loss). Negative for diaphoresis.       Feels "weak".  HENT: Negative.  Negative for congestion, ear discharge, ear pain, hearing loss, nosebleeds, sinus pain, sore throat and tinnitus.   Eyes: Negative.  Negative for blurred vision, double vision, photophobia, discharge and redness.  Respiratory: Positive for shortness of breath (with exertion). Negative for hemoptysis and sputum production.   Cardiovascular: Negative.  Negative for chest pain, palpitations, orthopnea and leg swelling.  Gastrointestinal: Positive for vomiting (intermittent). Negative for abdominal pain, blood in stool, constipation, diarrhea, melena and nausea.       Liquid diet only.  Unable to eat solids.  Genitourinary: Negative.  Negative for dysuria, frequency, hematuria and urgency.  Musculoskeletal: Positive for joint pain (Severe rheumatoid arthritis). Negative for back pain, falls, myalgias and neck pain.  Skin: Negative.  Negative for itching and rash.  Neurological: Positive for tingling (chronic neuropathy in hands and feet; stable) and weakness. Negative for dizziness, sensory change, speech change, focal weakness, seizures, loss of consciousness and headaches.  Endo/Heme/Allergies: Does not bruise/bleed easily.       Diabetes; on insulin pump; blood sugar 170-220.  Psychiatric/Behavioral: Negative for depression, memory loss and suicidal ideas. The patient is not nervous/anxious and does not have insomnia.   All other systems reviewed and are negative.  Performance status (ECOG):  3    Physical Exam:  Blood pressure 135/78, pulse (!) 103, temperature (!) 97.3 F (36.3 C),  temperature source Tympanic, resp. rate 18, weight 115 lb 3 oz (52.2 kg). GENERAL:  Fatigued appearing woman sitting comfortably in a wheelchair in the exam room in  no acute distress. MENTAL STATUS:  Alert and oriented to person, place and time. HEAD:  Short gray hair.  Normocephalic, atraumatic, face symmetric, no Cushingoid features. EYES:  Glasses.  Blue eyes.  No conjunctivitis or scleral icterus. SKIN:  No rashes, ulcers or lesions. EXTREMITIES: Trace left lower extremity edema.  No skin discoloration or tenderness.  No palpable cords. NEUROLOGICAL: Unremarkable. PSYCH:  Appropriate.  Tearful at times.   Radiology studies: 02/13/2015:  Abdomen and pelvic CT revealed bilateral mass-like adnexal regions (right adnexal mass 5.6 x 5.0 cm and the left adnexa mass 10.3 x 6.0 x 9.9 cm).  There was a large amount of soft tissue throughout the peritoneal cavity involving the omentum and other peritoneal surfaces.  There was a small volume ascites. There was a peripheral 3.3 x 1.9 cm low-attenuation lesion overlying the right lobe of the liver, likely representing a serosal implant. There was 1.4 x 2.2 cm ill-defined peripheral lesion within the inferior aspect of segment 6 adjacent to the ampulla in the duodenum.  04/28/2015:  Abdominal and pelvic CT revealed decreasing bilateral ovarian masses.   The left adnexal mass measured 4.0 x 6.1 cm (previously 5.0 x 8.5 cm). The right ovary measured 3.8 x 4.9 cm (previously 4.7 x 5.6 cm).  There was improved peritoneal carcinomatosis.  There was a small amount of ascites.  The hepatic dome lesion was stable. The previously seen right hepatic lobe lesion was not well-visualized.  There was a small left pleural effusion and trace right pleural effusion.  The nodular lesion at the ampulla of Vater, extending into the duodenum was stable.  There was no biliary ductal dilatation. 08/01/2015:  Rght upper extremity ultrasound revealed a near occlusive thrombus within the  central portion of the right internal jugular vein and central portion of the right subclavian vein. 09/01/2015:  Chest, abdomen, and pelvic CT revealed continued decrease in perihepatic fluid collection contiguous with the right pleural space with percutaneous drain.  09/11/2015:  Chest, abdomen, and pelvic CT revealed a residual versus recurrent fluid collection posterior to the right hepatic lobe (2.6 x 1.1 x 4.0 cm).   10/13/2015:  Chest, abdomen, and pelvic CT revealed resolution of the empyema. 02/15/2016:  Chest, abdomen, and pelvic CT revealed multiple soft tissue nodules throughout the pelvis, small bowel mesenteric and peritoneum and, concerning for peritoneal metastatic disease.  There was soft tissue irregularity within the left upper quadrant.  There was mild right hydronephrosis (etiology unclear).  There was interval resolution of previously described right pleural based fluid and gas collection. There was a pleural based nodule within the right lower hemi-thorax concerning for pleural based metastasis.  There were small bilateral pleural effusions.  There was pulmonary nodularity, predominately within the left upper lobe (metastatic or infectious/inflammatory etiology).  There was slightly increased mediastinal adenopathy (infectious/inflammatory or metastatic).  There was a low attenuation lesion within the left hepatic lobe (complicated fluid within the fissure or metastatic disease). 03/08/2016:  Abdomen and pelvic CT revealed mild progression of peritoneal disease in the abdomen.  There was interval increase in loculated fluid around the lateral segment left liver, stomach, and spleen.  There was persistent soft tissue lesion at the level of the ampulla with mild intra and extrahepatic biliary duct dilatation.  There was persistent intrahepatic and capsular metastatic disease involving the liver. 05/26/2016:  Head MRI revealed multiple small areas of acute infarct involving the occipital  parietal lobe bilaterally and the left lateral cerebellum,  consistent with posterior circulation emboli.  06/10/2016:  Adomen and pelvic CT revealed progression of peritoneal carcinomatosis predominantly in the perihepatic space, bilateral lower quadrants and pelvis.  There was worsening bilateral obstructive uropathy due to malignant involvement of the pelvic ureters.  There was stable small pleural/subpleural nodules at the right lung base.  There was stable small left pleural effusion.  There was decreased small volume perihepatic ascites.  08/09/2016:  Abdomen and pelvic CT revealed interval increase and loculated fluid collections within the peritoneal space consistent with progression of peritoneal ovarian carcinoma metastasis.  There was one large 7 cm collection in the RIGHT lower quadrant which had increased significantly in size and may represent of point of small bowel obstruction.  The proximal stomach and duodenum were decompressed. The mid small bowel was mildly dilated. There was concern for obstruction given the large intraperitoneal fluid collection.  There was increase in subcapsular hepatic metastatic fluid collections.  There was enlargement of rounded ampullary lesion in the second portion duodenum concerning for metastatic lesion.  There was mild biliary duct dilatation (stable).  There was nodular pleural metastasis in the RIGHT lower lobe pleural space. 11/03/2016:  Abdomen and pelvic CT revealed multifocal cystic metastases in the abdomen/pelvis, reflecting peritoneal disease.  The dominant lesion in the left anterior abdomen has progressed, while additional lesions were mixed.  Peritoneal disease along the liver and spleen progressed.  Pleural-based metastases along the posterior right hemithorax progressed. There were trace left pleural effusion.  There was a stable 1.7 cm duodenal lesion at the ampulla. 11/29/2016:  Abdomen and pelvic CT revealed mild interval enlargement of pleural  metastasis the RIGHT lower lobe.  There was overall mild decrease in cystic peritoneal metastasis within the abdomen pelvis. The solid lesion at the terminal ileum was decreased in size. Subcapsular lesion in the liver was slightly decreased in size.  There was no evidence of disease progression the abdomen pelvis.  There was mild intrahepatic duct dilatation similar prior.  The ampullary lesion was slightly larger.  There was mild hydronephrosis and hydroureter on the LEFT likely related to cystic metastasis at the LEFT vesicoureteral junction 12/09/2016:  MRCP revealed the cystic lesion within the pancreatic head/uncinate process had decreased in size and demonstrated no suspicious characteristics. This can be presumed a pseudocyst or focus of side branch duct ectasia.  There was similar to slight progression of extensive peritoneal metastasis since 11/29/2016. Grossly similar abdominal nodal and right pleural metastasis.  There was chronic mild common duct dilatation with similar soft tissue fullness about the ampulla compared to 11/29/2016.  There was improved to resolved left-sided hydronephrosis. 01/20/2017:  Abdomen and pelvic CT revealed complex mixed interval changes in widespread metastatic disease in the peritoneal cavity, lower chest and thoracic, abdominal and pelvic lymph nodes. Right lower chest pleural metastasis and several of the peritoneal tumor implants had increased.  There was new right inguinal lymphadenopathy.  Some of the peritoneal tumor implants had decreased.  The small dependent left pleural effusion was slightly increased .  The mild left hydroureteronephrosis was improved.  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   The chronic biliary ductal dilatation was stable .  There was no evidence of bowel obstruction. 05/22/2017:  Chest, abdomen, and pelvic CT revealed  significant enlargement of metastatic disease involving the right pleura, low paratracheal nodal chain, omentum,  peritoneal surfaces, mesentery, and liver. Tumors extending through the ostomy site into the subcutaneous tissues. There were multiple new metastatic liver and right pleural lesions.  A left pelvic mass was implicated in  obstruction of the left ureter, with prominent left hydronephrosis and hydroureter, but only subtle delay in excretion resulting.  There was new sclerotic lesion in the T11 vertebral body, concerning for possible metastatic disease. There was healing anterior nondisplaced rib fractures of the right second through ninth ribs that appeared new. 07/10/2017:  Chest, abdomen and pelvic CT revealed RIGHT lower lobe pleural-based nodules, slightly smaller.  There was stable bilateral pleural effusions (left > right) and stable mediastinal adenopathy.  There was evidence of progression of disease in the abdomen pelvis.  There was interval increase in size of hepatic lesions most noticeably in the LEFT hepatic lobe.  There was interval increase in bilateral hydronephrosis and hydroureter.  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  There was interval increase in size of large peritoneal and pelvic peritoneal implants.  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 10/24/2017:  RUQ ultrasound revealed multiple liver metastasis, the largest of 5.2 cm. There was no hydronephrosis.  There was a right pleural effusion.  Admissions: [facility] from 06/15/2015 - 06/22/2015.  She underwent exploratory laparotomy, lysis of adhesions, total abdominal hysterectomy with bilateral salpingo-oophorectomy, infracolic omentectomy, optimal tumor debulking(< 1 cm), recto-sigmoid resection with creation of end colostomy, cholecystectomy, mobilization of splenic flexure and liver with diaphragmatic stripping on 06/15/2015. The right diaphragm was cleared of tumor. During dissection, the diaphragm was entered and closed with sutures.   [facility] from 07/01/2015 - 07/03/2015 with pleural effusion. [facility] from 07/07/2015 - 07/09/2015 with recurrent pleural effusion. [facility] from 07/29/2015 - 08/10/2015 with a right-sided empyema and liver abscess. She underwent CT-guided placement of a liver abscess drain on 07/30/2015.  Liver abscess culture grew out group B strep and Enterobacter which was sensitive to Zosyn. She was transitioned to ertapenem Colbert Ewing) prior to discharge.  She was readmitted to [facility] on 09/17/2015. [facility] from 05/26/2016 - 05/28/2016 with altered mental status and an acute embolic CVA.  Head MRI on 05/26/2016 revealed multiple small areas of acute infarct involving the occipital parietal lobe bilaterally and the left lateral cerebellum,  consistent with posterior circulation emboli.  Work-up included a negative carotid ultrasound and echocardiogram. [facility] from 08/10/2016 - 08/14/2016 with a small bowel obstruction.  She was managed conservatively. [facility] from 09/27/2016 - 09/28/2016 with symptomatic anemia and diarrhea.   Appointment on 12/08/2017  Component Date Value Ref Range Status  . Magnesium 12/08/2017 1.6* 1.7 - 2.4 mg/dL Final   Performed at [facility], [address] [place], [place] [address]    Assessment:  BAYLE CALVO is a 72 y.o. female with progressive stage IV ovarian cancer.  She presented with abdominal discomfort and bloating.  Omental biopsy on 02/23/2015 revealed metastatic high grade serous carcinoma, consistent with gynecologic origin.   She was initially diagnosed with clinical stage IIIC (T3cN1Mx).  CA125 was 707 on 02/17/2015.  She received 4 cycles of neoadjuvant carboplatin and Taxol (03/05/2015 - 05/22/2015).  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  She had loose stools on oral magnesium.  She was initially on Neurontin then switched Lyrica with cycle #3.  Cycle #4 was notable for neutropenia (ANC 300) requiring GCSF x 3 days.    She underwent exploratory laparotomy, lysis of  adhesions, total abdominal hysterectomy with bilateral salpingo-oophorectomy, infracolic omentectomy, optimal tumor debulking(< 1 cm), recto-sigmoid resection with creation of end colostomy, cholecystectomy, mobilization of splenic flexure and liver with diaphragmatic stripping on 06/15/2015. The right diaphragm was cleared of tumor.  During dissection, the diaphragm was entered and closed with sutures.  She received tamoxifen from 02/25/2016 - 03/14/2016.  She received 4 cycles of carboplatin and gemcitabine (03/21/2016 - 05/24/2016) with GCSF/Neulasta support.  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  Cymbalta began on 02/24/2017  PDL-1 testing revealed a combined score was 5 (>=1 positive).     [employer] genetic testing negative for APC, ATM, BMPR1A, BRCA1, BRCA2, BRIP1, CDH1, CDK4, CDKN2A, CHEK2, EPCAM, MLH1, MSH2, MSH6, MUTYH, NBN, PALB2, PMS2, PTEN, RAD51C, RAD51D, SMAD4, STK11, TP53. Sequencing for select regions of POLE and POLD1, and large rearrangement analysis for select regions of GREM1 were negative.  She received 2 cycles of Doxil (06/21/2016 - 07/19/2016) with Neulasta support.  She received 4 cycles of topotecan with Neulasta support (08/22/2016 - 10/10/2016).    She received 3 cycles of pembrolizumab (Keytruda) from 12/06/2016 -  01/09/2017.  Cycle #1 was complicated by neutropenia (ANC 400) requiring GCSF/Granix x 3 days.  She received GCSF x 5 days with cycle #2.  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 Right lower chest pleural metastasis and several of the peritoneal tumor implants had increased.  There was new right inguinal lymphadenopathy.  Some of the peritoneal tumor implants had decreased.  The small dependent left pleural effusion was slightly increased .  The mild left hydroureteronephrosis was improved.   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   The chronic biliary ductal dilatation was stable .  There was no evidence of bowel obstruction.  CA125 was 802.9 on 03/30/2015, 567.9 on 04/13/2015, 168.8 on 05/15/2015, 85.2 on 07/17/2015, 68.6 on 07/28/2015, 34.5 on 09/17/2015, 20.7 on 11/06/2015, 49 on 01/22/2016, 106.1 on 02/22/2016, 138.8 on 03/07/2016, 220.8 on 03/21/2016, 152.8 on 04/11/2016, 125.6 on 04/18/2016, 76.8 on 05/03/2016, 60.2 on 05/24/2016, 44 on 07/19/2016, 65.8 on 08/17/2016, 53.4 on 09/12/2016, 47.3 on 10/10/2016, 91 on 12/20/2016, 141.1 on 01/09/2017, 463.7 on 02/27/2017, 907.1 on 03/27/2017, 192.1 on 05/22/2017, 189.1 on 05/24/2017, and 144.3 on 06/19/2017.  She has a history of recurrent right sided pleural effusion.  She underwent thoracentesis of 650 cc on post-operative day 3.  She was admitted to [facility] on 07/01/2015 and 07/07/2015 for recurrent shortness of breath.  She underwent 2 additional thoracenteses (1.1 L on 07/02/2015 and 850 cc on 07/08/2015).  Cytology was negative x 2.  Bilateral lower extremity duplex on 07/03/2015 was negative.  Echo revealed an EF of 55-60% on 07/08/2015 and 60-65% on 05/27/2016.    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 She was on Lovenox 60 mg twice a day.  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  She has severe rheumatoid arthritis.  Methotrexate and Enbrel were initially on hold.  She has a normocytic anemia.  Work-up on 02/17/2015 and 05/24/2016 revealed a normal ferritin, B12, folate, TSH.  She denies any melena or hematochezia.    She has anemia due to chronic disease. She received 1 unit PRBCs during her admission at [facility]. She denies any melena or hematochezia. She has diabetes and is on an insulin pump.  She has had persistent neutropenia felt  secondary to her rheumatoid arthritis.  Folate and MMA were normal.  TSH was 6.13 (high) with a  free T4 of 1.17 (0.61-1.12).  She began methotrexate (10 mg a week) and prednisone (5 mg a day) for severe rheumatoid arthritis on 12/17/2015.  She remains on prednisone alone (5 mg a day).  Bone marrow aspirate and biopsy on 06/09/2010 revealed a hypercellular marrow (70%) with no evidence of dysplasia or malignancy.  Flow cytometry was negative.  Cytogenetics were normal (46,XX).  FISH studies were negative for MDS.  Bone marrow aspirate and biopsy on 12/03/2015 revealed a normocellular to mildy hypercellular marrow for age (40%) with left shifted myelopoiesis, non specific dyserythropoiesis and mild megakaryocytic atypia with no increase in blasts.  There were multiple small nonspecific lymphoid aggregates (favor reactive). There was no increase in reticulin.  There was decreased myeloid cells (37%) with left shifted maturation and 1% atypical myelod blasts.  There was relatively increased monocytic cells (11%), relatively increased lymphoid cells (36%), and relatively increased eosinophils (6%).  Cytogenetics were normal (67, XX).  SNP microarray was normal.  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  She receives IV magnesium twice weekly.    She has chemotherapy induced anemia.  She has received Procrit (last 06/26/2017).  She received 1 unit of PRBCs on 06/05/2017.  Labs on 08/22/2016 revealed a normal B12 and folate.  Ferritin was 201, iron saturation 21% and TIBC 307 on 06/09/2017.  B12 and folate were normal.  Code status is DNR/DNI.  She developed acute LFT elevation on 11/28/2016.  Event was preceded by upper abdominal discomfort.  Hepatitis B core antibody total and hepatitis C antibody were negative.  She received 5 cycles of Abraxane (03/13/2017 - 06/26/2017).  Cycle #1 was truncated after day 1 secondary to side effects and Thanksgiving.  Chest, abdomen and pelvic CT on  07/10/2017 revealed progression of disease in the abdomen pelvis.  There was interval increase in size of hepatic lesions most noticeably in the LEFT hepatic lobe.  There was interval increase in bilateral hydronephrosis and hydroureter.  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  There was interval increase in size of large peritoneal and pelvic peritoneal implants.  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  She had increased LFTs on 10/23/2017.  RUQ ultrasound on 10/24/2017 revealed multiple liver metastasis, the largest of 5.2 cm. There was no hydronephrosis.  There was a right pleural effusion.  EGD on 11/28/2017 revealed esophageal plaques c/w candidiasis. 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  Duodenum was normal.  Gastric biopsy was c/w metastasis of patient's known ovarian carcinoma.  There was erosive gastritis with iron pill gastritis. KPH prep revealed yeast with pseudohyphae.  She underwent bilateral ureteral stent placement on 07/13/2017.  She was admitted to [facility]osp[facility] on 07/15/2017.  EGD on revealed esophageal plaques c/w candidiasis. 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  Gastric biopsy was c/w metastasis of patient's known ovarian carcinoma.  There was erosive gastritis with iron pill gastritis. KOH prep revealed yeast with pseudohyphae.  Symptomatically, her health is declining.  She is only able to drink liquids.  She is spending the majority of her day resting.  She denies pain.  Plan: 1. Metastatic ovarian cancer:  Discuss results of EGD.  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  Discuss no planned intervention.    Continue supportive care.  Patient denies  any pain.  Encouraged  patient and family to contact MD or Hospice nurse if any new issues or problems. 2. Iron pill gastritis:   Discontinue oral iron. 3. Esophageal candidiasis:  Rx:  fluconazole 200 mg po on day 1 then 100 mg po q day x 9 days. 4. Medications:   Reviewed entire list of patient's medications.  Discuss elimination of non-essential medications. 5.  Nutrition:   Discuss eating and drinking as tolerated.   Discuss supplement shakes.   Continue to monitor blood sugar. 6.  Hypomagnesemia:   Patient has been on twice weekly magnesium lab draws and IV magnesium until recently.   Magnesium has been better since initiation of magnesium glycinate.   Patient wishes to cancel Fridays's labs.   Patient wishes to have magnesium checked every other week. 7.  Support was provided patient and family.  8.  RTC on 12/18/2017 for labs (CBC with diff, BMP, Mg). 9.  RTC on 01/02/2018 for MD assessment and labs (CBC with diff, CMP, Mg).   Nolon Stalls, MD 12/08/17, 3:35 PM

## 2017-12-08 NOTE — Progress Notes (Signed)
No Magnesium needed today per Honor Loh NP.

## 2017-12-11 ENCOUNTER — Inpatient Hospital Stay

## 2017-12-15 ENCOUNTER — Other Ambulatory Visit: Payer: Medicare Other

## 2017-12-15 ENCOUNTER — Ambulatory Visit: Payer: Medicare Other

## 2017-12-18 ENCOUNTER — Telehealth: Payer: Self-pay | Admitting: *Deleted

## 2017-12-18 ENCOUNTER — Other Ambulatory Visit: Payer: Medicare Other

## 2017-12-18 ENCOUNTER — Ambulatory Visit: Payer: Medicare Other

## 2017-12-18 ENCOUNTER — Inpatient Hospital Stay

## 2017-12-18 NOTE — Telephone Encounter (Signed)
[name] notified to defer labs today, left message on voice mail

## 2017-12-18 NOTE — Telephone Encounter (Signed)
Patient not feeling well enough to come in for lab today, Do you want Hospice to draw it? Please advise

## 2017-12-18 NOTE — Telephone Encounter (Signed)
No. We can defer lab draw at this time.

## 2017-12-20 ENCOUNTER — Telehealth: Payer: Self-pay | Admitting: *Deleted

## 2017-12-20 NOTE — Telephone Encounter (Signed)
We do not manage her blood sugars. These orders will need to come from Dr. Caryl Bis.

## 2017-12-20 NOTE — Telephone Encounter (Signed)
[facility] nurse called reporting that patient is not taking in solid foods and that her sugars have been up and down. Her Insulin Pump is going to be stopped and they are requesting orders for Lantis and Humalog insulin with a sliding scale for the Humalog. Please advise

## 2017-12-20 NOTE — Telephone Encounter (Signed)
[name] informed and will contact Dr Caryl Bis

## 2017-12-21 ENCOUNTER — Telehealth: Payer: Self-pay | Admitting: Family Medicine

## 2017-12-21 NOTE — Telephone Encounter (Signed)
notified

## 2017-12-21 NOTE — Telephone Encounter (Signed)
This question needs to be directed to the patients endocrinologist at [facility] clinic, Dr Gabriel Carina, so she can determine the best step. Please advise hospice of this. Thanks.

## 2017-12-21 NOTE — Telephone Encounter (Signed)
Copied from [place] [id number]. Topic: General - Other >> Dec 21, 2017 10:02 AM Yvette Rack wrote: Reason for CRM: Nurse Otila Kluver from [facility] [phone] call stating that the pt is on insulin pump and she isn't eating a lot and her husband doesn't know how to use the the pump the Nurse request pt to be on the humalog in a viral or lantus injectable pt uses the    [place], [place] (Phone) [phone] (Fax)

## 2017-12-21 NOTE — Telephone Encounter (Signed)
Please advise 

## 2017-12-22 ENCOUNTER — Ambulatory Visit: Payer: Medicare Other

## 2017-12-22 ENCOUNTER — Other Ambulatory Visit: Payer: Medicare Other

## 2017-12-25 ENCOUNTER — Ambulatory Visit: Payer: Medicare Other | Admitting: Hematology and Oncology

## 2017-12-25 ENCOUNTER — Other Ambulatory Visit: Payer: Medicare Other

## 2017-12-25 ENCOUNTER — Ambulatory Visit: Payer: Medicare Other

## 2017-12-27 ENCOUNTER — Encounter: Payer: Self-pay | Admitting: Hematology and Oncology

## 2017-12-31 DEATH — deceased

## 2018-01-02 ENCOUNTER — Other Ambulatory Visit: Payer: Medicare Other

## 2018-01-02 ENCOUNTER — Ambulatory Visit: Payer: Medicare Other | Admitting: Hematology and Oncology

## 2018-01-11 ENCOUNTER — Ambulatory Visit: Payer: Medicare Other

## 2018-08-01 IMAGING — CT CT ABD-PELV W/ CM
2 of 5 series · 14 of 46 positions shown, 16 images · IV contrast (APPLIED)
Comparison: CT 06/10/2016

CLINICAL DATA: Stage IV ovarian carcinoma. Colostomy and
diaphragmatic stripping 06/15/2015. Omentectomy and hysterectomy. No
bowel movement for 24 hours. Lower abdominal pain

EXAM:
CT ABDOMEN AND PELVIS WITH CONTRAST
TECHNIQUE: Multidetector CT imaging of the abdomen and pelvis was performed
using the standard protocol following bolus administration of
intravenous contrast.
CONTRAST:  100mL LIU48I-AKK IOPAMIDOL (LIU48I-AKK) INJECTION 61%

[Series 2: routine abd/pel with · axial · 0.71mm/px · z∈[-403,+2]mm · 11 of 93 slices shown, 13 images]
[im 6/93  soft-tissue]
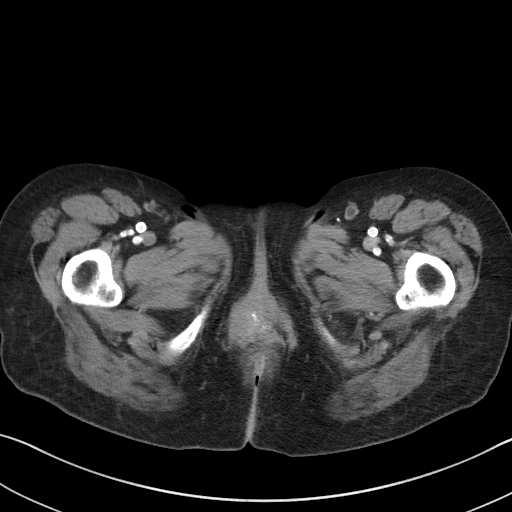
[im 6/93  bone]
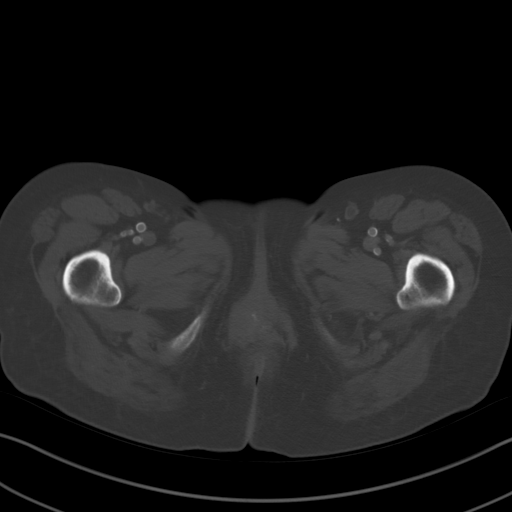
[im 16/93  soft-tissue]
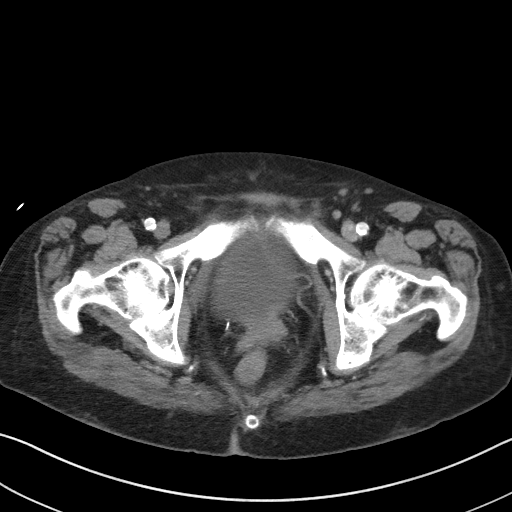
[im 21/93  soft-tissue]
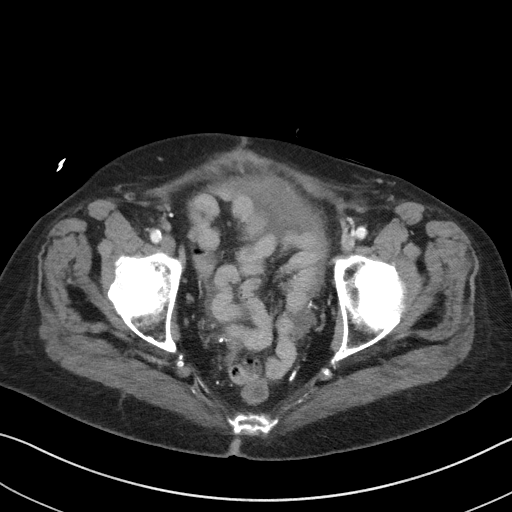
[im 31/93  soft-tissue]
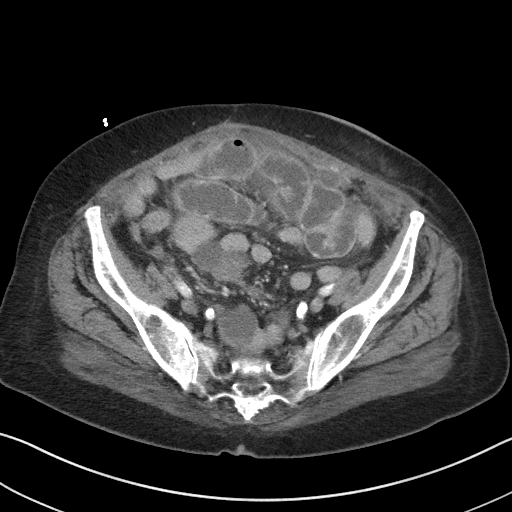
[im 36/93  soft-tissue]
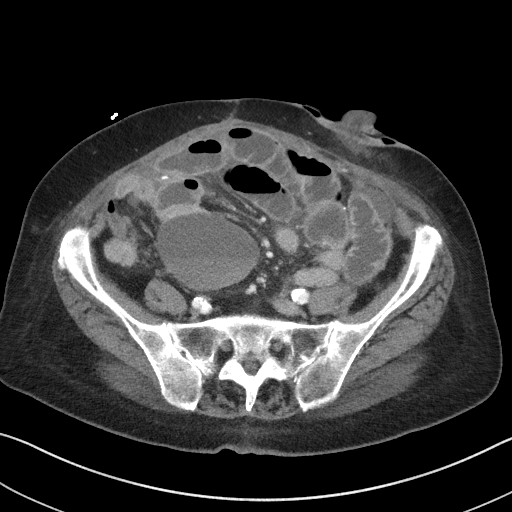
[im 47/93  soft-tissue]
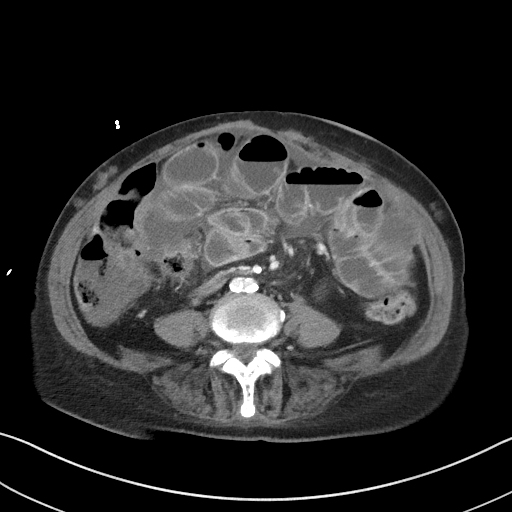
[im 57/93  soft-tissue]
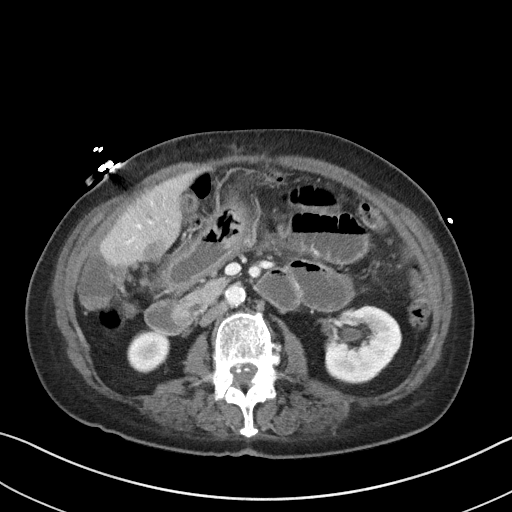
[im 62/93  soft-tissue]
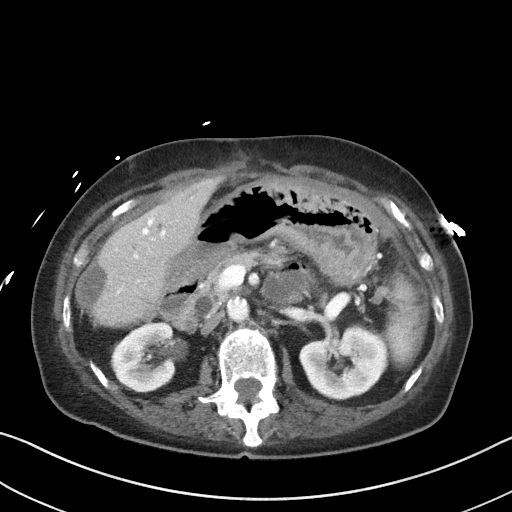
[im 72/93  soft-tissue]
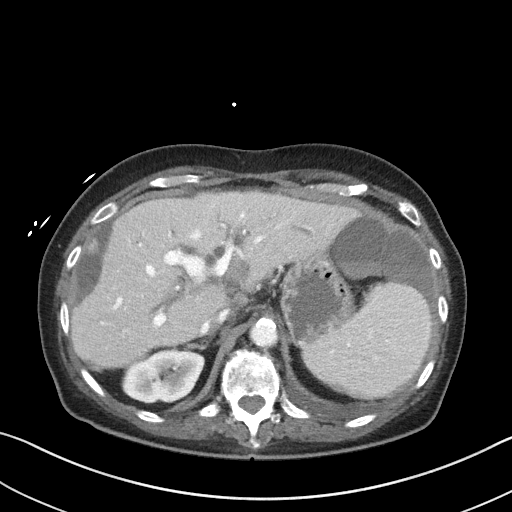
[im 72/93  bone]
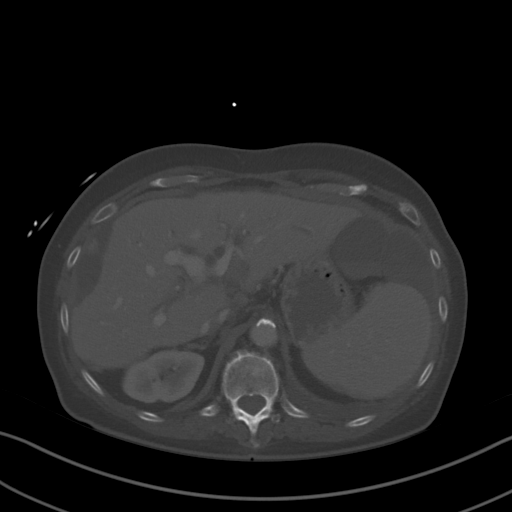
[im 77/93  soft-tissue]
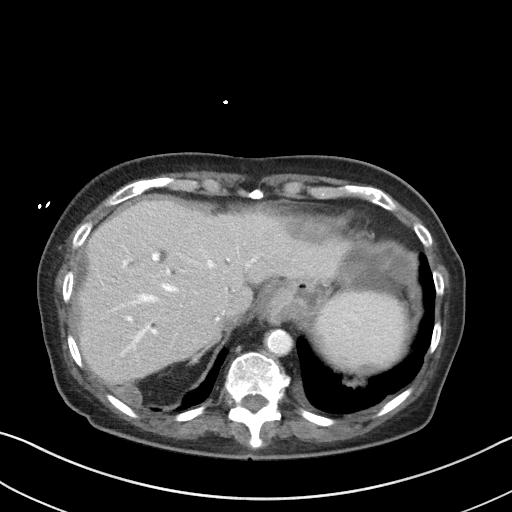
[im 87/93  soft-tissue]
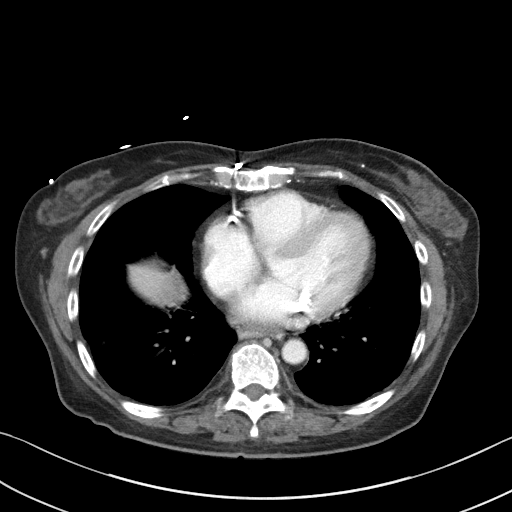

[Series 5: coronal st · coronal · 0.68mm/px · 3 of 84 slices shown]
[im 28/84  soft-tissue]
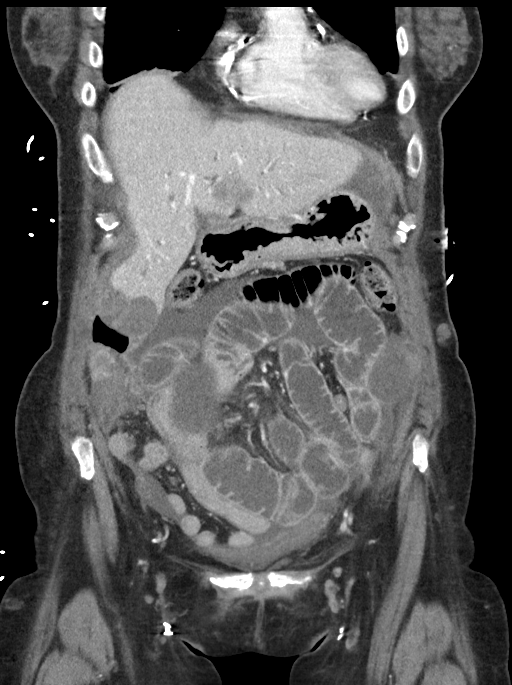
[im 37/84  soft-tissue]
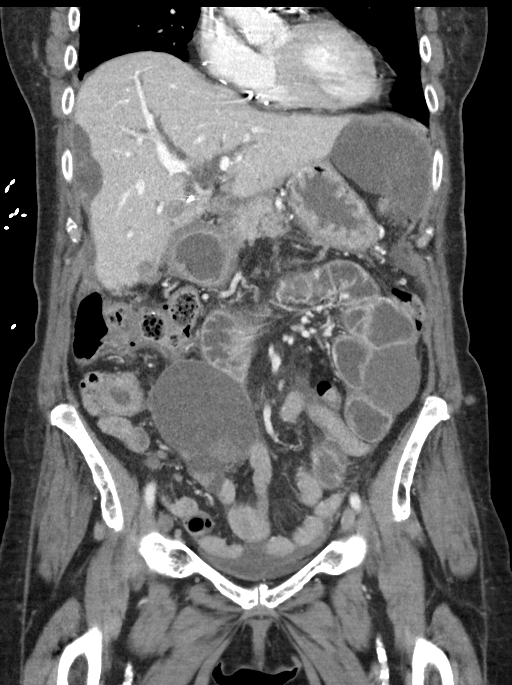
[im 47/84  soft-tissue]
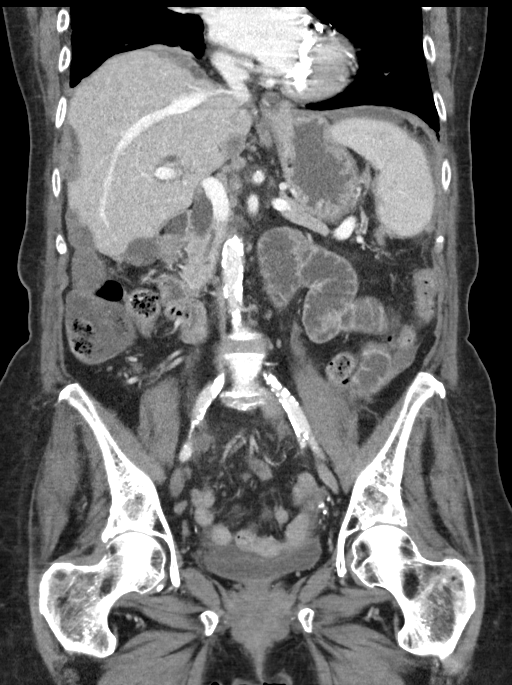

[14 of 46 positions shown; findings below may reference images not displayed]

FINDINGS: Lower chest: Nodule pleural implant in the posterior RIGHT lower
lobe is increased measuring 10 mm (nodule 12, series 2) compared to
8 mm. Similar lesion measuring 21 mm slightly superior is also
increased in size.

Hepatobiliary: Multiple scalloped subcapsular lesions in the LEFT
and RIGHT hepatic lobe increased slightly in size. For example
lesion in the subcapsular RIGHT hepatic lobe measures 61 x 18 mm
compared with 53 x 12 mm. There is a nodular implant within this
fluid collection. Subcapsular lesion in the LEFT lateral hepatic
lobe measuring 32 mm increased from 10 mm. Lesion along the
falciform ligament inferiorly which extends into the hepatic
parenchyma measures 25 mm compared to 25 mm. There is mild intra and
extrahepatic duct dilatation following cholecystectomy which is
similar.

Pancreas: No pancreatic duct dilatation or inflammation. There is a
lesion at the ampulla of the pancreas within the duodenum measuring
17 mm which compares to 14 mm on comparison exam (image 39, series
2). This is concerning for a metastatic lesion within the duodenum /
ampulla.

Spleen: Normal spleen

Adrenals/urinary tract: Adrenal glands are normal. The degree of
hydronephrosis seen on comparison exam is somewhat relieved. No
evidence of high-grade obstruction.

Stomach/Bowel: No oral contrast was administered. The stomach and
duodenum appear normal. The proximal jejunum is fluid-filled in
upper limits of normal 2.6 cm. The colon is decompressed. LEFT lower
quadrant ostomy noted.

There multiple nodules along the the cecal region and RIGHT lower
quadrant.

Large fluid collection within the RIGHT lower quadrant is increased
in size significantly measuring 69 x 57 mm increased from 25 x 22
mm. Loop of small bowel approximates this enlarged fluid collection.
This could represent a point obstruction.

No pneumatosis or portal venous gas.

Vascular/Lymphatic: Heavy calcification of the abdominal aorta no
lymphadenopathy

Reproductive: Post hysterectomy

Other: Additional fluid collections within the abdomen have
increased. For example the 2 fluid collections adjacent to the
spleen and liver measuring 34 and 38 mm compared with 18 and 21 mm.
These fluid collections have fluid fluid level suggesting internal
proteinaceous material (image 22, series 2).

Multiple new subcutaneous lesions in the ventral abdominal wall.
Example lesion measures 15 mm on the LEFT on image 42, series 2.
Similar lesions on the RIGHT.

Musculoskeletal: No aggressive osseous lesion.
IMPRESSION: 1. Interval increase and loculated fluid collections within the
peritoneal space consistent with progression of peritoneal ovarian
carcinoma metastasis. One large 7 cm collection in the RIGHT lower
quadrant is increased significantly in size and may represent of
point of small bowel obstruction.
2. The proximal stomach and duodenum are decompressed. The mid small
bowel is mildly dilated. Differential would include ileus versus
early obstruction. Concern for obstruction given the large
intraperitoneal fluid collection described above.
3. Increase in subcapsular hepatic metastatic fluid collections.
4. Enlargement of rounded ampullary lesion in the second portion
duodenum is concerning for metastatic lesion. Mild biliary duct
dilatation is similar prior. Recommend correlation with bilirubin
levels.
5. Nodular pleural metastasis in the RIGHT lower lobe pleural space.
Lesions increased.
6. Multiple subcutaneous lesions in the LEFT and RIGHT lower abdomen
are new from prior and concerning for metastatic lesions.
Potentially related to paracentesis.
